# Patient Record
Sex: Female | Born: 1950 | Race: White | Hispanic: No | Marital: Married | Smoking: Former smoker
Health system: Southern US, Community
[De-identification: ages and names within clinical notes are randomized; demographics above are authoritative.]

## PROBLEM LIST (undated history)

## (undated) DIAGNOSIS — R945 Abnormal results of liver function studies: Secondary | ICD-10-CM

## (undated) DIAGNOSIS — Z9189 Other specified personal risk factors, not elsewhere classified: Secondary | ICD-10-CM

## (undated) DIAGNOSIS — K559 Vascular disorder of intestine, unspecified: Secondary | ICD-10-CM

## (undated) DIAGNOSIS — I82622 Acute embolism and thrombosis of deep veins of left upper extremity: Secondary | ICD-10-CM

## (undated) DIAGNOSIS — I6529 Occlusion and stenosis of unspecified carotid artery: Secondary | ICD-10-CM

## (undated) DIAGNOSIS — R7881 Bacteremia: Secondary | ICD-10-CM

## (undated) DIAGNOSIS — M84453A Pathological fracture, unspecified femur, initial encounter for fracture: Secondary | ICD-10-CM

## (undated) DIAGNOSIS — E7140 Disorder of carnitine metabolism, unspecified: Secondary | ICD-10-CM

## (undated) DIAGNOSIS — H269 Unspecified cataract: Secondary | ICD-10-CM

## (undated) DIAGNOSIS — D689 Coagulation defect, unspecified: Secondary | ICD-10-CM

## (undated) DIAGNOSIS — K912 Postsurgical malabsorption, not elsewhere classified: Secondary | ICD-10-CM

## (undated) DIAGNOSIS — Z860100 Personal history of colon polyps, unspecified: Secondary | ICD-10-CM

## (undated) DIAGNOSIS — I739 Peripheral vascular disease, unspecified: Secondary | ICD-10-CM

## (undated) DIAGNOSIS — K90829 Short bowel syndrome, unspecified: Secondary | ICD-10-CM

## (undated) DIAGNOSIS — R161 Splenomegaly, not elsewhere classified: Secondary | ICD-10-CM

## (undated) DIAGNOSIS — A488 Other specified bacterial diseases: Secondary | ICD-10-CM

## (undated) DIAGNOSIS — K219 Gastro-esophageal reflux disease without esophagitis: Secondary | ICD-10-CM

## (undated) DIAGNOSIS — D638 Anemia in other chronic diseases classified elsewhere: Secondary | ICD-10-CM

## (undated) DIAGNOSIS — S42002A Fracture of unspecified part of left clavicle, initial encounter for closed fracture: Secondary | ICD-10-CM

## (undated) DIAGNOSIS — I509 Heart failure, unspecified: Secondary | ICD-10-CM

## (undated) DIAGNOSIS — N289 Disorder of kidney and ureter, unspecified: Secondary | ICD-10-CM

## (undated) DIAGNOSIS — B379 Candidiasis, unspecified: Secondary | ICD-10-CM

## (undated) DIAGNOSIS — Z9289 Personal history of other medical treatment: Secondary | ICD-10-CM

## (undated) DIAGNOSIS — I82409 Acute embolism and thrombosis of unspecified deep veins of unspecified lower extremity: Secondary | ICD-10-CM

## (undated) DIAGNOSIS — T7840XA Allergy, unspecified, initial encounter: Secondary | ICD-10-CM

## (undated) DIAGNOSIS — S7222XA Displaced subtrochanteric fracture of left femur, initial encounter for closed fracture: Secondary | ICD-10-CM

## (undated) DIAGNOSIS — K6389 Other specified diseases of intestine: Secondary | ICD-10-CM

## (undated) DIAGNOSIS — D229 Melanocytic nevi, unspecified: Secondary | ICD-10-CM

## (undated) DIAGNOSIS — J309 Allergic rhinitis, unspecified: Secondary | ICD-10-CM

## (undated) DIAGNOSIS — M1990 Unspecified osteoarthritis, unspecified site: Secondary | ICD-10-CM

## (undated) DIAGNOSIS — D6859 Other primary thrombophilia: Secondary | ICD-10-CM

## (undated) HISTORY — DX: Pathological fracture, unspecified femur, initial encounter for fracture: M84.453A

## (undated) HISTORY — DX: Disorder of kidney and ureter, unspecified: N28.9

## (undated) HISTORY — DX: Occlusion and stenosis of unspecified carotid artery: I65.29

## (undated) HISTORY — DX: Gastro-esophageal reflux disease without esophagitis: K21.9

## (undated) HISTORY — DX: Abnormal results of liver function studies: R94.5

## (undated) HISTORY — DX: Splenomegaly, not elsewhere classified: R16.1

## (undated) HISTORY — DX: Personal history of other medical treatment: Z92.89

## (undated) HISTORY — DX: Acute embolism and thrombosis of unspecified deep veins of unspecified lower extremity: I82.409

## (undated) HISTORY — PX: APPENDECTOMY: SHX54

## (undated) HISTORY — DX: Postsurgical malabsorption, not elsewhere classified: K91.2

## (undated) HISTORY — PX: CHOLECYSTECTOMY: SHX55

## (undated) HISTORY — DX: Bacteremia: R78.81

## (undated) HISTORY — DX: Fracture of unspecified part of left clavicle, initial encounter for closed fracture: S42.002A

## (undated) HISTORY — DX: Short bowel syndrome, unspecified: K90.829

## (undated) HISTORY — DX: Other primary thrombophilia: D68.59

## (undated) HISTORY — DX: Personal history of colon polyps, unspecified: Z86.0100

## (undated) HISTORY — DX: Disorder of carnitine metabolism, unspecified: E71.40

## (undated) HISTORY — DX: Other specified personal risk factors, not elsewhere classified: Z91.89

## (undated) HISTORY — DX: Other specified diseases of intestine: K63.89

## (undated) HISTORY — DX: Personal history of colonic polyps: Z86.010

## (undated) HISTORY — DX: Melanocytic nevi, unspecified: D22.9

## (undated) HISTORY — DX: Allergic rhinitis, unspecified: J30.9

## (undated) HISTORY — DX: Allergy, unspecified, initial encounter: T78.40XA

## (undated) HISTORY — DX: Anemia in other chronic diseases classified elsewhere: D63.8

## (undated) HISTORY — DX: Vascular disorder of intestine, unspecified: K55.9

## (undated) HISTORY — DX: Acute embolism and thrombosis of deep veins of left upper extremity: I82.622

## (undated) HISTORY — DX: Other specified bacterial diseases: A48.8

## (undated) HISTORY — DX: Candidiasis, unspecified: B37.9

## (undated) HISTORY — DX: Unspecified cataract: H26.9

## (undated) HISTORY — DX: Coagulation defect, unspecified: D68.9

## (undated) HISTORY — DX: Heart failure, unspecified: I50.9

## (undated) MED FILL — Acetaminophen Tab 325 MG: ORAL | Qty: 2 | Status: AC

## (undated) MED FILL — Diphenhydramine HCl Cap 25 MG: ORAL | Qty: 1 | Status: AC

---
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
## 2001-01-07 ENCOUNTER — Ambulatory Visit (HOSPITAL_COMMUNITY): Admission: RE | Admit: 2001-01-07 | Discharge: 2001-01-08 | Payer: Self-pay | Admitting: General Surgery

## 2001-01-07 ENCOUNTER — Encounter (HOSPITAL_BASED_OUTPATIENT_CLINIC_OR_DEPARTMENT_OTHER): Payer: Self-pay | Admitting: General Surgery

## 2001-01-07 ENCOUNTER — Encounter (INDEPENDENT_AMBULATORY_CARE_PROVIDER_SITE_OTHER): Payer: Self-pay | Admitting: *Deleted

## 2003-05-13 HISTORY — PX: SMALL INTESTINE SURGERY: SHX150

## 2003-11-05 ENCOUNTER — Emergency Department (HOSPITAL_COMMUNITY): Admission: EM | Admit: 2003-11-05 | Discharge: 2003-11-05 | Payer: Self-pay | Admitting: Emergency Medicine

## 2003-11-20 DIAGNOSIS — K912 Postsurgical malabsorption, not elsewhere classified: Secondary | ICD-10-CM | POA: Insufficient documentation

## 2003-11-20 DIAGNOSIS — D6859 Other primary thrombophilia: Secondary | ICD-10-CM | POA: Insufficient documentation

## 2004-03-15 ENCOUNTER — Ambulatory Visit: Payer: Self-pay | Admitting: Internal Medicine

## 2004-03-21 ENCOUNTER — Ambulatory Visit: Payer: Self-pay | Admitting: Internal Medicine

## 2004-03-21 ENCOUNTER — Encounter (INDEPENDENT_AMBULATORY_CARE_PROVIDER_SITE_OTHER): Payer: Self-pay | Admitting: Specialist

## 2004-03-21 ENCOUNTER — Ambulatory Visit (HOSPITAL_COMMUNITY): Admission: RE | Admit: 2004-03-21 | Discharge: 2004-03-21 | Payer: Self-pay | Admitting: Internal Medicine

## 2004-03-25 ENCOUNTER — Ambulatory Visit (HOSPITAL_COMMUNITY): Admission: RE | Admit: 2004-03-25 | Discharge: 2004-03-25 | Payer: Self-pay | Admitting: Internal Medicine

## 2004-03-26 ENCOUNTER — Ambulatory Visit: Payer: Self-pay | Admitting: Internal Medicine

## 2004-04-02 ENCOUNTER — Ambulatory Visit: Payer: Self-pay | Admitting: Internal Medicine

## 2004-04-04 ENCOUNTER — Emergency Department (HOSPITAL_COMMUNITY): Admission: EM | Admit: 2004-04-04 | Discharge: 2004-04-04 | Payer: Self-pay | Admitting: Emergency Medicine

## 2004-04-05 ENCOUNTER — Inpatient Hospital Stay (HOSPITAL_COMMUNITY): Admission: EM | Admit: 2004-04-05 | Discharge: 2004-04-30 | Payer: Self-pay | Admitting: Emergency Medicine

## 2004-04-05 ENCOUNTER — Encounter (INDEPENDENT_AMBULATORY_CARE_PROVIDER_SITE_OTHER): Payer: Self-pay | Admitting: *Deleted

## 2004-04-05 ENCOUNTER — Ambulatory Visit: Payer: Self-pay | Admitting: Gastroenterology

## 2004-04-08 ENCOUNTER — Encounter (INDEPENDENT_AMBULATORY_CARE_PROVIDER_SITE_OTHER): Payer: Self-pay | Admitting: *Deleted

## 2004-04-08 ENCOUNTER — Ambulatory Visit: Payer: Self-pay | Admitting: Oncology

## 2004-04-10 ENCOUNTER — Encounter (INDEPENDENT_AMBULATORY_CARE_PROVIDER_SITE_OTHER): Payer: Self-pay | Admitting: Specialist

## 2004-04-12 ENCOUNTER — Encounter (INDEPENDENT_AMBULATORY_CARE_PROVIDER_SITE_OTHER): Payer: Self-pay | Admitting: *Deleted

## 2004-04-29 ENCOUNTER — Ambulatory Visit: Payer: Self-pay | Admitting: Oncology

## 2004-05-07 ENCOUNTER — Emergency Department (HOSPITAL_COMMUNITY): Admission: EM | Admit: 2004-05-07 | Discharge: 2004-05-07 | Payer: Self-pay | Admitting: Emergency Medicine

## 2004-05-22 ENCOUNTER — Ambulatory Visit: Payer: Self-pay | Admitting: Internal Medicine

## 2004-06-03 ENCOUNTER — Ambulatory Visit: Payer: Self-pay | Admitting: Internal Medicine

## 2004-06-14 ENCOUNTER — Ambulatory Visit: Payer: Self-pay | Admitting: Oncology

## 2004-07-03 ENCOUNTER — Ambulatory Visit: Payer: Self-pay | Admitting: Internal Medicine

## 2004-07-04 ENCOUNTER — Ambulatory Visit: Payer: Self-pay | Admitting: Internal Medicine

## 2004-07-04 ENCOUNTER — Inpatient Hospital Stay (HOSPITAL_COMMUNITY): Admission: EM | Admit: 2004-07-04 | Discharge: 2004-07-11 | Payer: Self-pay | Admitting: *Deleted

## 2004-07-11 ENCOUNTER — Ambulatory Visit: Payer: Self-pay | Admitting: Gastroenterology

## 2004-07-19 ENCOUNTER — Ambulatory Visit: Payer: Self-pay | Admitting: Internal Medicine

## 2004-07-31 ENCOUNTER — Ambulatory Visit: Payer: Self-pay | Admitting: Oncology

## 2004-08-19 ENCOUNTER — Ambulatory Visit: Payer: Self-pay | Admitting: Internal Medicine

## 2004-09-12 ENCOUNTER — Ambulatory Visit: Payer: Self-pay | Admitting: Oncology

## 2004-09-16 ENCOUNTER — Ambulatory Visit: Payer: Self-pay | Admitting: Internal Medicine

## 2004-10-22 ENCOUNTER — Ambulatory Visit (HOSPITAL_COMMUNITY): Admission: RE | Admit: 2004-10-22 | Discharge: 2004-10-22 | Payer: Self-pay | Admitting: Internal Medicine

## 2004-10-28 ENCOUNTER — Ambulatory Visit: Payer: Self-pay | Admitting: Oncology

## 2004-11-05 ENCOUNTER — Encounter: Payer: Self-pay | Admitting: Internal Medicine

## 2004-11-06 ENCOUNTER — Ambulatory Visit (HOSPITAL_COMMUNITY): Admission: RE | Admit: 2004-11-06 | Discharge: 2004-11-06 | Payer: Self-pay | Admitting: Oncology

## 2004-11-19 DIAGNOSIS — K6389 Other specified diseases of intestine: Secondary | ICD-10-CM | POA: Insufficient documentation

## 2004-12-03 ENCOUNTER — Encounter: Payer: Self-pay | Admitting: Internal Medicine

## 2004-12-04 ENCOUNTER — Ambulatory Visit: Payer: Self-pay | Admitting: Internal Medicine

## 2004-12-18 ENCOUNTER — Ambulatory Visit: Payer: Self-pay | Admitting: Oncology

## 2005-02-14 ENCOUNTER — Ambulatory Visit: Payer: Self-pay | Admitting: Oncology

## 2005-03-12 ENCOUNTER — Ambulatory Visit: Payer: Self-pay | Admitting: Internal Medicine

## 2005-04-08 ENCOUNTER — Ambulatory Visit: Payer: Self-pay | Admitting: Oncology

## 2005-05-01 ENCOUNTER — Ambulatory Visit: Payer: Self-pay | Admitting: Internal Medicine

## 2005-06-03 ENCOUNTER — Ambulatory Visit: Payer: Self-pay | Admitting: Oncology

## 2005-07-22 ENCOUNTER — Ambulatory Visit: Payer: Self-pay | Admitting: Oncology

## 2005-08-07 ENCOUNTER — Ambulatory Visit: Payer: Self-pay | Admitting: Internal Medicine

## 2005-09-02 ENCOUNTER — Ambulatory Visit: Payer: Self-pay | Admitting: Oncology

## 2005-09-24 LAB — CBC WITH DIFFERENTIAL (CANCER CENTER ONLY)
BASO%: 1 % (ref 0.0–2.0)
Eosinophils Absolute: 0.3 10*3/uL (ref 0.0–0.5)
LYMPH%: 36.5 % (ref 14.0–48.0)
MCV: 96 fL (ref 81–101)
MONO#: 0.9 10*3/uL (ref 0.1–0.9)
NEUT#: 3.4 10*3/uL (ref 1.5–6.5)
Platelets: 129 10*3/uL — ABNORMAL LOW (ref 145–400)
RBC: 3.62 10*6/uL — ABNORMAL LOW (ref 3.70–5.32)
RDW: 12.7 % (ref 10.5–14.6)
WBC: 7.4 10*3/uL (ref 3.9–10.0)

## 2005-09-24 LAB — PROTIME-INR (CHCC SATELLITE)
INR: 2 (ref 2.0–3.5)
Protime: 17.5 Seconds — ABNORMAL HIGH (ref 10.6–13.4)

## 2005-10-08 LAB — CBC WITH DIFFERENTIAL (CANCER CENTER ONLY)
BASO%: 0.7 % (ref 0.0–2.0)
EOS%: 2.9 % (ref 0.0–7.0)
Eosinophils Absolute: 0.2 10*3/uL (ref 0.0–0.5)
LYMPH%: 43.6 % (ref 14.0–48.0)
MCH: 31.2 pg (ref 26.0–34.0)
MCHC: 32.5 g/dL (ref 32.0–36.0)
MCV: 96 fL (ref 81–101)
MONO%: 9.4 % (ref 0.0–13.0)
NEUT#: 2.2 10*3/uL (ref 1.5–6.5)
Platelets: 150 10*3/uL (ref 145–400)
RBC: 3.77 10*6/uL (ref 3.70–5.32)
RDW: 12 % (ref 10.5–14.6)

## 2005-10-08 LAB — PROTIME-INR (CHCC SATELLITE)
INR: 1.8 — ABNORMAL LOW (ref 2.0–3.5)
Protime: 16.5 s — ABNORMAL HIGH (ref 10.6–13.4)

## 2005-10-08 LAB — BASIC METABOLIC PANEL WITH GFR
BUN: 18 mg/dL (ref 6–23)
CO2: 28 meq/L (ref 19–32)
Calcium: 8.5 mg/dL (ref 8.4–10.5)
Chloride: 102 meq/L (ref 96–112)
Creatinine, Ser: 0.5 mg/dL (ref 0.4–1.2)
Glucose, Bld: 101 mg/dL — ABNORMAL HIGH (ref 70–99)
Potassium: 4.4 meq/L (ref 3.5–5.3)
Sodium: 139 meq/L (ref 135–145)

## 2005-10-15 LAB — CBC WITH DIFFERENTIAL (CANCER CENTER ONLY)
BASO#: 0 10*3/uL (ref 0.0–0.2)
EOS%: 3.9 % (ref 0.0–7.0)
HCT: 35.2 % (ref 34.8–46.6)
HGB: 11.6 g/dL (ref 11.6–15.9)
LYMPH%: 37.2 % (ref 14.0–48.0)
MCH: 32 pg (ref 26.0–34.0)
MCHC: 33 g/dL (ref 32.0–36.0)
MCV: 97 fL (ref 81–101)
MONO%: 8.8 % (ref 0.0–13.0)
NEUT#: 2.9 10*3/uL (ref 1.5–6.5)
NEUT%: 49.5 % (ref 39.6–80.0)

## 2005-10-15 LAB — PROTIME-INR (CHCC SATELLITE)
INR: 1.6 — ABNORMAL LOW (ref 2.0–3.5)
Protime: 15.4 Seconds — ABNORMAL HIGH (ref 10.6–13.4)

## 2005-10-22 ENCOUNTER — Ambulatory Visit: Payer: Self-pay | Admitting: Oncology

## 2005-10-23 LAB — PROTIME-INR (CHCC SATELLITE)
INR: 1.9 — ABNORMAL LOW (ref 2.0–3.5)
Protime: 16.8 Seconds — ABNORMAL HIGH (ref 10.6–13.4)

## 2005-10-30 LAB — PROTIME-INR (CHCC SATELLITE)

## 2005-11-06 LAB — CBC WITH DIFFERENTIAL (CANCER CENTER ONLY)
BASO%: 0.8 % (ref 0.0–2.0)
Eosinophils Absolute: 0.3 10*3/uL (ref 0.0–0.5)
HCT: 35.2 % (ref 34.8–46.6)
LYMPH#: 2.4 10*3/uL (ref 0.9–3.3)
MCV: 97 fL (ref 81–101)
MONO#: 0.6 10*3/uL (ref 0.1–0.9)
NEUT%: 55.7 % (ref 39.6–80.0)
Platelets: 105 10*3/uL — ABNORMAL LOW (ref 145–400)
RBC: 3.62 10*6/uL — ABNORMAL LOW (ref 3.70–5.32)
RDW: 11.9 % (ref 10.5–14.6)
WBC: 7.5 10*3/uL (ref 3.9–10.0)

## 2005-11-06 LAB — PROTIME-INR (CHCC SATELLITE)

## 2005-11-13 LAB — PROTIME-INR (CHCC SATELLITE): INR: 2.5 (ref 2.0–3.5)

## 2005-11-20 LAB — PROTIME-INR (CHCC SATELLITE)
INR: 2.5 (ref 2.0–3.5)
Protime: 19 Seconds — ABNORMAL HIGH (ref 10.6–13.4)

## 2005-11-26 LAB — CBC WITH DIFFERENTIAL (CANCER CENTER ONLY)
BASO#: 0 10*3/uL (ref 0.0–0.2)
BASO%: 0.6 % (ref 0.0–2.0)
HCT: 38.6 % (ref 34.8–46.6)
HGB: 12.3 g/dL (ref 11.6–15.9)
LYMPH#: 2.4 10*3/uL (ref 0.9–3.3)
MONO#: 0.5 10*3/uL (ref 0.1–0.9)
NEUT%: 48.8 % (ref 39.6–80.0)
RDW: 11.4 % (ref 10.5–14.6)
WBC: 6.3 10*3/uL (ref 3.9–10.0)

## 2005-11-26 LAB — PROTIME-INR (CHCC SATELLITE): Protime: 20 Seconds — ABNORMAL HIGH (ref 10.6–13.4)

## 2005-11-28 ENCOUNTER — Ambulatory Visit: Payer: Self-pay | Admitting: Internal Medicine

## 2005-12-05 ENCOUNTER — Ambulatory Visit (HOSPITAL_COMMUNITY): Admission: RE | Admit: 2005-12-05 | Discharge: 2005-12-05 | Payer: Self-pay | Admitting: Internal Medicine

## 2005-12-05 ENCOUNTER — Ambulatory Visit: Payer: Self-pay | Admitting: Oncology

## 2005-12-05 ENCOUNTER — Encounter: Payer: Self-pay | Admitting: Internal Medicine

## 2005-12-05 HISTORY — PX: COLONOSCOPY: SHX174

## 2005-12-08 LAB — CBC WITH DIFFERENTIAL (CANCER CENTER ONLY)
BASO#: 0.1 10*3/uL (ref 0.0–0.2)
EOS%: 6.2 % (ref 0.0–7.0)
Eosinophils Absolute: 0.4 10*3/uL (ref 0.0–0.5)
HCT: 37.5 % (ref 34.8–46.6)
HGB: 12.3 g/dL (ref 11.6–15.9)
LYMPH#: 1.9 10*3/uL (ref 0.9–3.3)
MCHC: 32.8 g/dL (ref 32.0–36.0)
NEUT%: 50.5 % (ref 39.6–80.0)

## 2005-12-08 LAB — PROTIME-INR (CHCC SATELLITE): Protime: 13.1 Seconds (ref 10.6–13.4)

## 2005-12-09 ENCOUNTER — Ambulatory Visit: Payer: Self-pay | Admitting: Internal Medicine

## 2005-12-11 LAB — PROTIME-INR (CHCC SATELLITE): INR: 1.6 — ABNORMAL LOW (ref 2.0–3.5)

## 2005-12-15 LAB — PROTIME-INR (CHCC SATELLITE): INR: 2.5 (ref 2.0–3.5)

## 2005-12-17 ENCOUNTER — Ambulatory Visit: Payer: Self-pay | Admitting: Internal Medicine

## 2005-12-18 LAB — CBC WITH DIFFERENTIAL (CANCER CENTER ONLY)
BASO#: 0 10*3/uL (ref 0.0–0.2)
Eosinophils Absolute: 0.3 10*3/uL (ref 0.0–0.5)
HCT: 36.4 % (ref 34.8–46.6)
HGB: 12.1 g/dL (ref 11.6–15.9)
LYMPH%: 33.1 % (ref 14.0–48.0)
MCH: 31.5 pg (ref 26.0–34.0)
MCHC: 33.2 g/dL (ref 32.0–36.0)
MCV: 95 fL (ref 81–101)
MONO%: 8.8 % (ref 0.0–13.0)
NEUT#: 3.2 10*3/uL (ref 1.5–6.5)
NEUT%: 53.1 % (ref 39.6–80.0)
RBC: 3.84 10*6/uL (ref 3.70–5.32)

## 2005-12-18 LAB — PROTIME-INR (CHCC SATELLITE)
INR: 3.3 (ref 2.0–3.5)
Protime: 21.8 Seconds — ABNORMAL HIGH (ref 10.6–13.4)

## 2005-12-19 LAB — COMPREHENSIVE METABOLIC PANEL
AST: 33 U/L (ref 0–37)
Albumin: 2.8 g/dL — ABNORMAL LOW (ref 3.5–5.2)
Alkaline Phosphatase: 111 U/L (ref 39–117)
BUN: 14 mg/dL (ref 6–23)
Glucose, Bld: 96 mg/dL (ref 70–99)
Potassium: 3.9 mEq/L (ref 3.5–5.3)
Total Bilirubin: 0.4 mg/dL (ref 0.3–1.2)

## 2005-12-19 LAB — IRON AND TIBC
%SAT: 15 % — ABNORMAL LOW (ref 20–55)
Iron: 49 ug/dL (ref 42–145)
TIBC: 320 ug/dL (ref 250–470)
UIBC: 271 ug/dL

## 2005-12-22 LAB — PROTIME-INR (CHCC SATELLITE)
INR: 1.8 — ABNORMAL LOW (ref 2.0–3.5)
Protime: 16.5 Seconds — ABNORMAL HIGH (ref 10.6–13.4)

## 2005-12-29 LAB — PROTIME-INR (CHCC SATELLITE): INR: 2 (ref 2.0–3.5)

## 2006-01-05 LAB — PROTIME-INR (CHCC SATELLITE): INR: 2 (ref 2.0–3.5)

## 2006-01-15 LAB — CBC WITH DIFFERENTIAL (CANCER CENTER ONLY)
EOS%: 4.9 % (ref 0.0–7.0)
Eosinophils Absolute: 0.3 10*3/uL (ref 0.0–0.5)
LYMPH%: 37.9 % (ref 14.0–48.0)
MCH: 31.1 pg (ref 26.0–34.0)
MCHC: 32.7 g/dL (ref 32.0–36.0)
MCV: 95 fL (ref 81–101)
MONO%: 7.5 % (ref 0.0–13.0)
Platelets: 144 10*3/uL — ABNORMAL LOW (ref 145–400)
RBC: 3.75 10*6/uL (ref 3.70–5.32)

## 2006-01-15 LAB — PROTIME-INR (CHCC SATELLITE)
INR: 3.6 — ABNORMAL HIGH (ref 2.0–3.5)
Protime: 43.2 Seconds — ABNORMAL HIGH (ref 10.6–13.4)

## 2006-01-20 ENCOUNTER — Ambulatory Visit: Payer: Self-pay | Admitting: Oncology

## 2006-01-22 LAB — CBC WITH DIFFERENTIAL (CANCER CENTER ONLY)
BASO%: 0.7 % (ref 0.0–2.0)
LYMPH%: 33 % (ref 14.0–48.0)
MCH: 31.8 pg (ref 26.0–34.0)
MCV: 96 fL (ref 81–101)
MONO#: 0.4 10*3/uL (ref 0.1–0.9)
MONO%: 5.8 % (ref 0.0–13.0)
Platelets: 153 10*3/uL (ref 145–400)
RDW: 11.3 % (ref 10.5–14.6)
WBC: 7 10*3/uL (ref 3.9–10.0)

## 2006-01-22 LAB — PROTIME-INR (CHCC SATELLITE): Protime: 24 Seconds — ABNORMAL HIGH (ref 10.6–13.4)

## 2006-01-29 LAB — PROTIME-INR (CHCC SATELLITE): Protime: 21.6 Seconds — ABNORMAL HIGH (ref 10.6–13.4)

## 2006-02-05 LAB — CBC WITH DIFFERENTIAL (CANCER CENTER ONLY)
BASO%: 0.7 % (ref 0.0–2.0)
LYMPH#: 2.5 10*3/uL (ref 0.9–3.3)
MONO#: 0.4 10*3/uL (ref 0.1–0.9)
NEUT#: 3.7 10*3/uL (ref 1.5–6.5)
Platelets: 182 10*3/uL (ref 145–400)
RDW: 11.7 % (ref 10.5–14.6)
WBC: 7.1 10*3/uL (ref 3.9–10.0)

## 2006-02-05 LAB — PROTIME-INR (CHCC SATELLITE)
INR: 2.1 (ref 2.0–3.5)
Protime: 25.2 Seconds — ABNORMAL HIGH (ref 10.6–13.4)

## 2006-02-25 ENCOUNTER — Ambulatory Visit: Payer: Self-pay | Admitting: Internal Medicine

## 2006-02-26 LAB — CBC WITH DIFFERENTIAL (CANCER CENTER ONLY)
BASO#: 0 10*3/uL (ref 0.0–0.2)
Eosinophils Absolute: 0.2 10*3/uL (ref 0.0–0.5)
HCT: 35 % (ref 34.8–46.6)
HGB: 11.7 g/dL (ref 11.6–15.9)
LYMPH#: 2.4 10*3/uL (ref 0.9–3.3)
MONO#: 0.7 10*3/uL (ref 0.1–0.9)
NEUT#: 3.1 10*3/uL (ref 1.5–6.5)
NEUT%: 47.6 % (ref 39.6–80.0)
RBC: 3.67 10*6/uL — ABNORMAL LOW (ref 3.70–5.32)

## 2006-03-17 ENCOUNTER — Ambulatory Visit: Payer: Self-pay | Admitting: Oncology

## 2006-03-19 LAB — CBC WITH DIFFERENTIAL (CANCER CENTER ONLY)
BASO%: 0.6 % (ref 0.0–2.0)
EOS%: 5.7 % (ref 0.0–7.0)
HCT: 36.1 % (ref 34.8–46.6)
HGB: 12 g/dL (ref 11.6–15.9)
LYMPH#: 2.3 10*3/uL (ref 0.9–3.3)
MCHC: 33.3 g/dL (ref 32.0–36.0)
MONO#: 0.5 10*3/uL (ref 0.1–0.9)
NEUT#: 3.1 10*3/uL (ref 1.5–6.5)
RDW: 10.7 % (ref 10.5–14.6)
WBC: 6.3 10*3/uL (ref 3.9–10.0)

## 2006-03-19 LAB — PROTIME-INR (CHCC SATELLITE)
INR: 2.5 (ref 2.0–3.5)
Protime: 30 Seconds — ABNORMAL HIGH (ref 10.6–13.4)

## 2006-04-17 LAB — PROTIME-INR (CHCC SATELLITE): Protime: 28.8 Seconds — ABNORMAL HIGH (ref 10.6–13.4)

## 2006-05-14 ENCOUNTER — Ambulatory Visit: Payer: Self-pay | Admitting: Oncology

## 2006-05-15 ENCOUNTER — Other Ambulatory Visit: Payer: Self-pay | Admitting: Oncology

## 2006-05-15 LAB — PROTIME-INR (CHCC SATELLITE)
INR: 1.9 — ABNORMAL LOW (ref 2.0–3.5)
Protime: 22.8 Seconds — ABNORMAL HIGH (ref 10.6–13.4)

## 2006-05-21 LAB — CBC WITH DIFFERENTIAL (CANCER CENTER ONLY)
BASO#: 0 10*3/uL (ref 0.0–0.2)
EOS%: 4 % (ref 0.0–7.0)
Eosinophils Absolute: 0.2 10*3/uL (ref 0.0–0.5)
HGB: 12 g/dL (ref 11.6–15.9)
LYMPH%: 46 % (ref 14.0–48.0)
MCH: 32.7 pg (ref 26.0–34.0)
MCHC: 34.3 g/dL (ref 32.0–36.0)
MCV: 95 fL (ref 81–101)
MONO%: 8.8 % (ref 0.0–13.0)
NEUT#: 2.4 10*3/uL (ref 1.5–6.5)
NEUT%: 40.6 % (ref 39.6–80.0)
RBC: 3.67 10*6/uL — ABNORMAL LOW (ref 3.70–5.32)

## 2006-05-21 LAB — BASIC METABOLIC PANEL
BUN: 13 mg/dL (ref 6–23)
Chloride: 108 mEq/L (ref 96–112)
Creatinine, Ser: 0.49 mg/dL (ref 0.40–1.20)
Potassium: 3.9 mEq/L (ref 3.5–5.3)

## 2006-05-21 LAB — PROTIME-INR (CHCC SATELLITE)
INR: 1.7 — ABNORMAL LOW (ref 2.0–3.5)
Protime: 20.4 Seconds — ABNORMAL HIGH (ref 10.6–13.4)

## 2006-05-28 LAB — PROTIME-INR (CHCC SATELLITE)
INR: 2.2 (ref 2.0–3.5)
Protime: 26.4 Seconds — ABNORMAL HIGH (ref 10.6–13.4)

## 2006-06-04 ENCOUNTER — Ambulatory Visit: Payer: Self-pay | Admitting: Internal Medicine

## 2006-06-18 LAB — CBC WITH DIFFERENTIAL (CANCER CENTER ONLY)
BASO#: 0 10*3/uL (ref 0.0–0.2)
EOS%: 4.1 % (ref 0.0–7.0)
Eosinophils Absolute: 0.2 10*3/uL (ref 0.0–0.5)
HCT: 37.7 % (ref 34.8–46.6)
HGB: 12.6 g/dL (ref 11.6–15.9)
LYMPH#: 3 10*3/uL (ref 0.9–3.3)
MCH: 32.4 pg (ref 26.0–34.0)
MCHC: 33.5 g/dL (ref 32.0–36.0)
NEUT%: 35.4 % — ABNORMAL LOW (ref 39.6–80.0)
RBC: 3.9 10*6/uL (ref 3.70–5.32)

## 2006-06-18 LAB — PROTIME-INR (CHCC SATELLITE)
INR: 1.5 — ABNORMAL LOW (ref 2.0–3.5)
Protime: 18 s — ABNORMAL HIGH (ref 10.6–13.4)

## 2006-06-25 LAB — PROTIME-INR (CHCC SATELLITE)

## 2006-07-01 ENCOUNTER — Ambulatory Visit: Payer: Self-pay | Admitting: Oncology

## 2006-07-02 LAB — PROTIME-INR (CHCC SATELLITE): Protime: 26.4 Seconds — ABNORMAL HIGH (ref 10.6–13.4)

## 2006-07-13 LAB — CBC WITH DIFFERENTIAL (CANCER CENTER ONLY)
BASO#: 0.1 10*3/uL (ref 0.0–0.2)
Eosinophils Absolute: 0.4 10*3/uL (ref 0.0–0.5)
HCT: 38.1 % (ref 34.8–46.6)
HGB: 12.8 g/dL (ref 11.6–15.9)
LYMPH#: 2.3 10*3/uL (ref 0.9–3.3)
LYMPH%: 30.6 % (ref 14.0–48.0)
MCV: 96 fL (ref 81–101)
MONO#: 0.7 10*3/uL (ref 0.1–0.9)
NEUT%: 54.5 % (ref 39.6–80.0)
WBC: 7.4 10*3/uL (ref 3.9–10.0)

## 2006-07-13 LAB — PROTIME-INR (CHCC SATELLITE)
INR: 2 (ref 2.0–3.5)
Protime: 24 Seconds — ABNORMAL HIGH (ref 10.6–13.4)

## 2006-08-13 LAB — CBC WITH DIFFERENTIAL (CANCER CENTER ONLY)
BASO#: 0.1 10*3/uL (ref 0.0–0.2)
EOS%: 3.9 % (ref 0.0–7.0)
HCT: 38 % (ref 34.8–46.6)
HGB: 12.7 g/dL (ref 11.6–15.9)
LYMPH#: 3.3 10*3/uL (ref 0.9–3.3)
LYMPH%: 40 % (ref 14.0–48.0)
MCH: 31.8 pg (ref 26.0–34.0)
MCHC: 33.4 g/dL (ref 32.0–36.0)
MCV: 95 fL (ref 81–101)
MONO%: 7 % (ref 0.0–13.0)
NEUT%: 48.3 % (ref 39.6–80.0)

## 2006-08-19 ENCOUNTER — Ambulatory Visit: Payer: Self-pay | Admitting: Oncology

## 2006-08-24 LAB — PROTIME-INR (CHCC SATELLITE): INR: 1.5 — ABNORMAL LOW (ref 2.0–3.5)

## 2006-08-31 LAB — PROTIME-INR (CHCC SATELLITE)
INR: 1.7 — ABNORMAL LOW (ref 2.0–3.5)
Protime: 20.4 Seconds — ABNORMAL HIGH (ref 10.6–13.4)

## 2006-09-07 LAB — PROTIME-INR (CHCC SATELLITE): INR: 2.6 (ref 2.0–3.5)

## 2006-09-17 LAB — COMPREHENSIVE METABOLIC PANEL
ALT: 28 U/L (ref 0–35)
Albumin: 3.8 g/dL (ref 3.5–5.2)
CO2: 21 mEq/L (ref 19–32)
Chloride: 105 mEq/L (ref 96–112)
Glucose, Bld: 88 mg/dL (ref 70–99)
Sodium: 140 mEq/L (ref 135–145)

## 2006-09-17 LAB — CBC WITH DIFFERENTIAL (CANCER CENTER ONLY)
BASO#: 0 10*3/uL (ref 0.0–0.2)
EOS%: 3.6 % (ref 0.0–7.0)
HGB: 11.7 g/dL (ref 11.6–15.9)
MCH: 32.3 pg (ref 26.0–34.0)
MCHC: 34.2 g/dL (ref 32.0–36.0)
MONO%: 8.3 % (ref 0.0–13.0)
NEUT#: 2.3 10*3/uL (ref 1.5–6.5)
RDW: 12.1 % (ref 10.5–14.6)

## 2006-09-17 LAB — PROTIME-INR (CHCC SATELLITE)

## 2006-09-21 ENCOUNTER — Encounter: Payer: Self-pay | Admitting: Internal Medicine

## 2006-09-24 LAB — PROTIME-INR (CHCC SATELLITE): INR: 1.6 — ABNORMAL LOW (ref 2.0–3.5)

## 2006-10-01 LAB — PROTIME-INR (CHCC SATELLITE)
INR: 1.9 — ABNORMAL LOW (ref 2.0–3.5)
Protime: 22.8 Seconds — ABNORMAL HIGH (ref 10.6–13.4)

## 2006-10-02 ENCOUNTER — Ambulatory Visit (HOSPITAL_COMMUNITY): Admission: RE | Admit: 2006-10-02 | Discharge: 2006-10-02 | Payer: Self-pay | Admitting: Internal Medicine

## 2006-10-07 ENCOUNTER — Ambulatory Visit: Payer: Self-pay | Admitting: Oncology

## 2006-10-08 LAB — PROTIME-INR (CHCC SATELLITE): Protime: 19.2 Seconds — ABNORMAL HIGH (ref 10.6–13.4)

## 2006-10-16 LAB — CBC WITH DIFFERENTIAL (CANCER CENTER ONLY)
BASO#: 0 10*3/uL (ref 0.0–0.2)
Eosinophils Absolute: 0.5 10*3/uL (ref 0.0–0.5)
HGB: 12.1 g/dL (ref 11.6–15.9)
LYMPH#: 2.8 10*3/uL (ref 0.9–3.3)
NEUT#: 2 10*3/uL (ref 1.5–6.5)
RBC: 3.87 10*6/uL (ref 3.70–5.32)
WBC: 5.8 10*3/uL (ref 3.9–10.0)

## 2006-10-23 LAB — PROTIME-INR (CHCC SATELLITE)
INR: 1.8 — ABNORMAL LOW (ref 2.0–3.5)
Protime: 21.6 Seconds — ABNORMAL HIGH (ref 10.6–13.4)

## 2006-10-28 ENCOUNTER — Ambulatory Visit: Payer: Self-pay | Admitting: Internal Medicine

## 2006-10-30 LAB — PROTIME-INR (CHCC SATELLITE): INR: 2 (ref 2.0–3.5)

## 2006-11-25 ENCOUNTER — Ambulatory Visit: Payer: Self-pay | Admitting: Oncology

## 2006-11-26 LAB — PROTIME-INR (CHCC SATELLITE)

## 2006-12-03 LAB — PROTIME-INR (CHCC SATELLITE)
INR: 1.7 — ABNORMAL LOW (ref 2.0–3.5)
Protime: 20.4 Seconds — ABNORMAL HIGH (ref 10.6–13.4)

## 2006-12-17 LAB — PROTIME-INR (CHCC SATELLITE)
INR: 1.9 — ABNORMAL LOW (ref 2.0–3.5)
Protime: 22.8 Seconds — ABNORMAL HIGH (ref 10.6–13.4)

## 2007-01-07 LAB — CBC WITH DIFFERENTIAL (CANCER CENTER ONLY)
EOS%: 4.9 % (ref 0.0–7.0)
Eosinophils Absolute: 0.3 10*3/uL (ref 0.0–0.5)
MCH: 30.9 pg (ref 26.0–34.0)
MCHC: 33.6 g/dL (ref 32.0–36.0)
MONO%: 8.9 % (ref 0.0–13.0)
NEUT#: 2.9 10*3/uL (ref 1.5–6.5)
Platelets: 163 10*3/uL (ref 145–400)
RBC: 3.92 10*6/uL (ref 3.70–5.32)

## 2007-01-07 LAB — PROTIME-INR (CHCC SATELLITE): INR: 2.6 (ref 2.0–3.5)

## 2007-01-13 ENCOUNTER — Ambulatory Visit: Payer: Self-pay | Admitting: Oncology

## 2007-01-15 LAB — COMPREHENSIVE METABOLIC PANEL
ALT: 65 U/L — ABNORMAL HIGH (ref 0–35)
Alkaline Phosphatase: 102 U/L (ref 39–117)
CO2: 29 mEq/L (ref 19–32)
Creatinine, Ser: 0.5 mg/dL (ref 0.40–1.20)
Glucose, Bld: 94 mg/dL (ref 70–99)
Sodium: 139 mEq/L (ref 135–145)
Total Bilirubin: 0.4 mg/dL (ref 0.3–1.2)
Total Protein: 7.6 g/dL (ref 6.0–8.3)

## 2007-01-15 LAB — IRON AND TIBC
%SAT: 26 % (ref 20–55)
Iron: 73 ug/dL (ref 42–145)
UIBC: 209 ug/dL

## 2007-01-15 LAB — PROTIME-INR (CHCC SATELLITE): Protime: 36 Seconds — ABNORMAL HIGH (ref 10.6–13.4)

## 2007-01-15 LAB — CBC WITH DIFFERENTIAL (CANCER CENTER ONLY)
Eosinophils Absolute: 0.3 10*3/uL (ref 0.0–0.5)
MONO#: 0.6 10*3/uL (ref 0.1–0.9)
NEUT#: 2.3 10*3/uL (ref 1.5–6.5)
Platelets: 134 10*3/uL — ABNORMAL LOW (ref 145–400)
RBC: 3.76 10*6/uL (ref 3.70–5.32)
WBC: 5.6 10*3/uL (ref 3.9–10.0)

## 2007-01-15 LAB — FERRITIN: Ferritin: 190 ng/mL (ref 10–291)

## 2007-01-26 LAB — PROTIME-INR (CHCC SATELLITE): Protime: 32.4 Seconds — ABNORMAL HIGH (ref 10.6–13.4)

## 2007-02-10 LAB — CBC WITH DIFFERENTIAL (CANCER CENTER ONLY)
Eosinophils Absolute: 0.2 10*3/uL (ref 0.0–0.5)
HCT: 32.7 % — ABNORMAL LOW (ref 34.8–46.6)
LYMPH%: 44.8 % (ref 14.0–48.0)
MCV: 96 fL (ref 81–101)
MONO#: 0.5 10*3/uL (ref 0.1–0.9)
NEUT%: 42.7 % (ref 39.6–80.0)
Platelets: 93 10*3/uL — ABNORMAL LOW (ref 145–400)
RBC: 3.4 10*6/uL — ABNORMAL LOW (ref 3.70–5.32)
WBC: 6 10*3/uL (ref 3.9–10.0)

## 2007-02-10 LAB — PROTIME-INR (CHCC SATELLITE)
INR: 2.2 (ref 2.0–3.5)
Protime: 26.4 Seconds — ABNORMAL HIGH (ref 10.6–13.4)

## 2007-02-24 LAB — PROTIME-INR (CHCC SATELLITE): INR: 2.5 (ref 2.0–3.5)

## 2007-03-03 ENCOUNTER — Ambulatory Visit: Payer: Self-pay | Admitting: Oncology

## 2007-03-04 LAB — CBC WITH DIFFERENTIAL (CANCER CENTER ONLY)
Eosinophils Absolute: 0.3 10*3/uL (ref 0.0–0.5)
HCT: 37.2 % (ref 34.8–46.6)
LYMPH%: 49.4 % — ABNORMAL HIGH (ref 14.0–48.0)
MCH: 32.4 pg (ref 26.0–34.0)
MCV: 98 fL (ref 81–101)
MONO%: 8.1 % (ref 0.0–13.0)
NEUT%: 37 % — ABNORMAL LOW (ref 39.6–80.0)
Platelets: 133 10*3/uL — ABNORMAL LOW (ref 145–400)
RBC: 3.79 10*6/uL (ref 3.70–5.32)

## 2007-03-13 HISTORY — PX: ORIF PROXIMAL FEMORAL FRACTURE W/ ITST NAIL SYSTEM: SUR949

## 2007-03-15 ENCOUNTER — Inpatient Hospital Stay (HOSPITAL_COMMUNITY): Admission: EM | Admit: 2007-03-15 | Discharge: 2007-03-21 | Payer: Self-pay | Admitting: *Deleted

## 2007-04-01 LAB — CBC WITH DIFFERENTIAL (CANCER CENTER ONLY)
BASO%: 0.7 % (ref 0.0–2.0)
EOS%: 5.8 % (ref 0.0–7.0)
LYMPH%: 37 % (ref 14.0–48.0)
MCH: 32.4 pg (ref 26.0–34.0)
MCHC: 33.6 g/dL (ref 32.0–36.0)
MCV: 96 fL (ref 81–101)
MONO#: 0.5 10*3/uL (ref 0.1–0.9)
MONO%: 7.5 % (ref 0.0–13.0)
NEUT%: 49 % (ref 39.6–80.0)
Platelets: 246 10*3/uL (ref 145–400)
RDW: 10.6 % (ref 10.5–14.6)
WBC: 7.3 10*3/uL (ref 3.9–10.0)

## 2007-04-01 LAB — PROTIME-INR (CHCC SATELLITE)

## 2007-04-05 LAB — PROTIME-INR (CHCC SATELLITE)
INR: 2.7 (ref 2.0–3.5)
Protime: 32.4 Seconds — ABNORMAL HIGH (ref 10.6–13.4)

## 2007-04-12 LAB — PROTIME-INR (CHCC SATELLITE)
INR: 2.3 (ref 2.0–3.5)
Protime: 27.6 Seconds — ABNORMAL HIGH (ref 10.6–13.4)

## 2007-04-19 ENCOUNTER — Ambulatory Visit: Payer: Self-pay | Admitting: Oncology

## 2007-04-19 LAB — PROTIME-INR (CHCC SATELLITE)

## 2007-04-29 LAB — CBC WITH DIFFERENTIAL (CANCER CENTER ONLY)
BASO%: 0.8 % (ref 0.0–2.0)
EOS%: 4.5 % (ref 0.0–7.0)
LYMPH#: 2.9 10*3/uL (ref 0.9–3.3)
MCHC: 33.2 g/dL (ref 32.0–36.0)
MONO#: 0.5 10*3/uL (ref 0.1–0.9)
Platelets: 120 10*3/uL — ABNORMAL LOW (ref 145–400)
RDW: 10.9 % (ref 10.5–14.6)
WBC: 5.8 10*3/uL (ref 3.9–10.0)

## 2007-04-29 LAB — PROTIME-INR (CHCC SATELLITE): INR: 4.2 — ABNORMAL HIGH (ref 2.0–3.5)

## 2007-04-30 ENCOUNTER — Ambulatory Visit: Payer: Self-pay | Admitting: Internal Medicine

## 2007-05-07 LAB — PROTIME-INR (CHCC SATELLITE): Protime: 27.6 Seconds — ABNORMAL HIGH (ref 10.6–13.4)

## 2007-05-20 LAB — PROTIME-INR (CHCC SATELLITE): Protime: 31.2 Seconds — ABNORMAL HIGH (ref 10.6–13.4)

## 2007-05-27 LAB — CBC WITH DIFFERENTIAL (CANCER CENTER ONLY)
BASO#: 0 10*3/uL (ref 0.0–0.2)
Eosinophils Absolute: 0.3 10*3/uL (ref 0.0–0.5)
HGB: 12 g/dL (ref 11.6–15.9)
LYMPH%: 43.8 % (ref 14.0–48.0)
MCH: 31.5 pg (ref 26.0–34.0)
MCV: 95 fL (ref 81–101)
MONO%: 9.6 % (ref 0.0–13.0)
NEUT#: 2.4 10*3/uL (ref 1.5–6.5)
RBC: 3.81 10*6/uL (ref 3.70–5.32)

## 2007-05-27 LAB — PROTIME-INR (CHCC SATELLITE): INR: 2.2 (ref 2.0–3.5)

## 2007-06-22 ENCOUNTER — Ambulatory Visit: Payer: Self-pay | Admitting: Oncology

## 2007-06-24 LAB — CBC WITH DIFFERENTIAL (CANCER CENTER ONLY)
BASO%: 0.8 % (ref 0.0–2.0)
EOS%: 3.3 % (ref 0.0–7.0)
HCT: 31.6 % — ABNORMAL LOW (ref 34.8–46.6)
LYMPH#: 1.8 10*3/uL (ref 0.9–3.3)
MCHC: 33.2 g/dL (ref 32.0–36.0)
MONO#: 0.6 10*3/uL (ref 0.1–0.9)
NEUT%: 45.4 % (ref 39.6–80.0)
Platelets: 150 10*3/uL (ref 145–400)
RDW: 12.9 % (ref 10.5–14.6)
WBC: 4.9 10*3/uL (ref 3.9–10.0)

## 2007-06-24 LAB — PROTIME-INR (CHCC SATELLITE): INR: 3.5 (ref 2.0–3.5)

## 2007-07-08 LAB — PROTIME-INR (CHCC SATELLITE)

## 2007-07-22 LAB — CBC WITH DIFFERENTIAL (CANCER CENTER ONLY)
BASO#: 0 10*3/uL (ref 0.0–0.2)
BASO%: 0.5 % (ref 0.0–2.0)
HCT: 33.9 % — ABNORMAL LOW (ref 34.8–46.6)
HGB: 11.4 g/dL — ABNORMAL LOW (ref 11.6–15.9)
LYMPH#: 1.3 10*3/uL (ref 0.9–3.3)
MONO#: 0.5 10*3/uL (ref 0.1–0.9)
NEUT#: 3.1 10*3/uL (ref 1.5–6.5)
NEUT%: 61.7 % (ref 39.6–80.0)
RDW: 12.3 % (ref 10.5–14.6)
WBC: 5.1 10*3/uL (ref 3.9–10.0)

## 2007-07-22 LAB — PROTIME-INR (CHCC SATELLITE)
INR: 2.8 (ref 2.0–3.5)
Protime: 33.6 Seconds — ABNORMAL HIGH (ref 10.6–13.4)

## 2007-08-10 ENCOUNTER — Ambulatory Visit: Payer: Self-pay | Admitting: Oncology

## 2007-08-12 LAB — PROTHROMBIN TIME: INR: 3.5 — ABNORMAL HIGH (ref 0.0–1.5)

## 2007-08-19 LAB — CBC WITH DIFFERENTIAL (CANCER CENTER ONLY)
BASO%: 0.6 % (ref 0.0–2.0)
EOS%: 7 % (ref 0.0–7.0)
HCT: 30.7 % — ABNORMAL LOW (ref 34.8–46.6)
LYMPH%: 29 % (ref 14.0–48.0)
MCH: 31.3 pg (ref 26.0–34.0)
MCHC: 33.7 g/dL (ref 32.0–36.0)
MCV: 93 fL (ref 81–101)
MONO%: 10 % (ref 0.0–13.0)
NEUT%: 53.4 % (ref 39.6–80.0)
Platelets: 149 10*3/uL (ref 145–400)
RDW: 11.4 % (ref 10.5–14.6)
WBC: 5.1 10*3/uL (ref 3.9–10.0)

## 2007-08-19 LAB — PROTHROMBIN TIME: Prothrombin Time: 30.2 seconds — ABNORMAL HIGH (ref 11.6–15.2)

## 2007-09-16 LAB — CBC WITH DIFFERENTIAL (CANCER CENTER ONLY)
BASO#: 0.1 10*3/uL (ref 0.0–0.2)
Eosinophils Absolute: 0.4 10*3/uL (ref 0.0–0.5)
HGB: 11.2 g/dL — ABNORMAL LOW (ref 11.6–15.9)
LYMPH#: 1.9 10*3/uL (ref 0.9–3.3)
MONO#: 0.7 10*3/uL (ref 0.1–0.9)
NEUT#: 3 10*3/uL (ref 1.5–6.5)
RBC: 3.78 10*6/uL (ref 3.70–5.32)

## 2007-09-16 LAB — PROTIME-INR (CHCC SATELLITE): Protime: 49.2 Seconds — ABNORMAL HIGH (ref 10.6–13.4)

## 2007-09-22 ENCOUNTER — Ambulatory Visit: Payer: Self-pay | Admitting: Oncology

## 2007-09-23 ENCOUNTER — Encounter: Payer: Self-pay | Admitting: Internal Medicine

## 2007-09-23 LAB — PROTIME-INR (CHCC SATELLITE)
INR: 2.7 (ref 2.0–3.5)
Protime: 32.4 Seconds — ABNORMAL HIGH (ref 10.6–13.4)

## 2007-09-26 ENCOUNTER — Encounter: Payer: Self-pay | Admitting: Internal Medicine

## 2007-09-27 ENCOUNTER — Encounter: Payer: Self-pay | Admitting: Internal Medicine

## 2007-10-07 ENCOUNTER — Encounter: Payer: Self-pay | Admitting: Internal Medicine

## 2007-10-14 LAB — CBC WITH DIFFERENTIAL (CANCER CENTER ONLY)
BASO#: 0.1 10*3/uL (ref 0.0–0.2)
Eosinophils Absolute: 0.2 10*3/uL (ref 0.0–0.5)
HCT: 34.2 % — ABNORMAL LOW (ref 34.8–46.6)
HGB: 11.5 g/dL — ABNORMAL LOW (ref 11.6–15.9)
LYMPH%: 34.2 % (ref 14.0–48.0)
MCH: 29.7 pg (ref 26.0–34.0)
MCV: 89 fL (ref 81–101)
MONO%: 10.7 % (ref 0.0–13.0)
NEUT#: 3.1 10*3/uL (ref 1.5–6.5)
RBC: 3.86 10*6/uL (ref 3.70–5.32)

## 2007-10-14 LAB — PROTIME-INR (CHCC SATELLITE): INR: 3 (ref 2.0–3.5)

## 2007-10-22 ENCOUNTER — Encounter: Payer: Self-pay | Admitting: Internal Medicine

## 2007-10-25 ENCOUNTER — Encounter: Payer: Self-pay | Admitting: Internal Medicine

## 2007-11-05 ENCOUNTER — Ambulatory Visit: Payer: Self-pay | Admitting: Oncology

## 2007-11-11 LAB — CBC WITH DIFFERENTIAL (CANCER CENTER ONLY)
BASO%: 0.8 % (ref 0.0–2.0)
EOS%: 5 % (ref 0.0–7.0)
LYMPH%: 32.7 % (ref 14.0–48.0)
MCH: 29.2 pg (ref 26.0–34.0)
MCHC: 33.5 g/dL (ref 32.0–36.0)
MCV: 87 fL (ref 81–101)
MONO%: 9.7 % (ref 0.0–13.0)
Platelets: 153 10*3/uL (ref 145–400)
RDW: 12.9 % (ref 10.5–14.6)
WBC: 5.7 10*3/uL (ref 3.9–10.0)

## 2007-11-11 LAB — PROTIME-INR (CHCC SATELLITE)

## 2007-11-24 ENCOUNTER — Encounter: Payer: Self-pay | Admitting: Internal Medicine

## 2007-11-26 ENCOUNTER — Encounter: Payer: Self-pay | Admitting: Internal Medicine

## 2007-12-01 ENCOUNTER — Telehealth: Payer: Self-pay | Admitting: Internal Medicine

## 2007-12-01 DIAGNOSIS — R509 Fever, unspecified: Secondary | ICD-10-CM | POA: Insufficient documentation

## 2007-12-02 ENCOUNTER — Ambulatory Visit (HOSPITAL_COMMUNITY): Admission: RE | Admit: 2007-12-02 | Discharge: 2007-12-02 | Payer: Self-pay | Admitting: Internal Medicine

## 2007-12-09 LAB — PROTIME-INR (CHCC SATELLITE)
INR: 4 — ABNORMAL HIGH (ref 2.0–3.5)
Protime: 48 Seconds — ABNORMAL HIGH (ref 10.6–13.4)

## 2007-12-09 LAB — CBC WITH DIFFERENTIAL (CANCER CENTER ONLY)
Eosinophils Absolute: 1.3 10*3/uL — ABNORMAL HIGH (ref 0.0–0.5)
LYMPH%: 25.7 % (ref 14.0–48.0)
MCH: 29.3 pg (ref 26.0–34.0)
MCV: 88 fL (ref 81–101)
MONO%: 7.3 % (ref 0.0–13.0)
Platelets: 167 10*3/uL (ref 145–400)
RBC: 3.75 10*6/uL (ref 3.70–5.32)

## 2007-12-16 LAB — PROTIME-INR (CHCC SATELLITE)
INR: 3.6 — ABNORMAL HIGH (ref 2.0–3.5)
Protime: 43.2 Seconds — ABNORMAL HIGH (ref 10.6–13.4)

## 2007-12-20 ENCOUNTER — Ambulatory Visit: Payer: Self-pay | Admitting: Oncology

## 2007-12-21 ENCOUNTER — Encounter: Payer: Self-pay | Admitting: Internal Medicine

## 2007-12-22 ENCOUNTER — Telehealth: Payer: Self-pay | Admitting: Internal Medicine

## 2007-12-27 ENCOUNTER — Encounter: Payer: Self-pay | Admitting: Internal Medicine

## 2007-12-28 ENCOUNTER — Encounter: Payer: Self-pay | Admitting: Internal Medicine

## 2007-12-30 LAB — PROTIME-INR (CHCC SATELLITE): INR: 2.2 (ref 2.0–3.5)

## 2008-01-06 ENCOUNTER — Encounter: Payer: Self-pay | Admitting: Internal Medicine

## 2008-01-06 LAB — CBC WITH DIFFERENTIAL (CANCER CENTER ONLY)
BASO%: 0.6 % (ref 0.0–2.0)
EOS%: 9.9 % — ABNORMAL HIGH (ref 0.0–7.0)
LYMPH#: 2.9 10*3/uL (ref 0.9–3.3)
MONO#: 0.5 10*3/uL (ref 0.1–0.9)
Platelets: 208 10*3/uL (ref 145–400)
RDW: 12 % (ref 10.5–14.6)
WBC: 6.6 10*3/uL (ref 3.9–10.0)

## 2008-01-06 LAB — PROTIME-INR (CHCC SATELLITE)

## 2008-01-13 LAB — PROTIME-INR (CHCC SATELLITE): INR: 1.9 — ABNORMAL LOW (ref 2.0–3.5)

## 2008-01-19 ENCOUNTER — Ambulatory Visit: Payer: Self-pay | Admitting: Internal Medicine

## 2008-01-19 DIAGNOSIS — R7402 Elevation of levels of lactic acid dehydrogenase (LDH): Secondary | ICD-10-CM | POA: Insufficient documentation

## 2008-01-19 DIAGNOSIS — M949 Disorder of cartilage, unspecified: Secondary | ICD-10-CM

## 2008-01-19 DIAGNOSIS — R740 Nonspecific elevation of levels of transaminase and lactic acid dehydrogenase [LDH]: Secondary | ICD-10-CM

## 2008-01-19 DIAGNOSIS — E559 Vitamin D deficiency, unspecified: Secondary | ICD-10-CM | POA: Insufficient documentation

## 2008-01-19 DIAGNOSIS — M899 Disorder of bone, unspecified: Secondary | ICD-10-CM | POA: Insufficient documentation

## 2008-01-19 DIAGNOSIS — N259 Disorder resulting from impaired renal tubular function, unspecified: Secondary | ICD-10-CM | POA: Insufficient documentation

## 2008-01-19 DIAGNOSIS — R7401 Elevation of levels of liver transaminase levels: Secondary | ICD-10-CM | POA: Insufficient documentation

## 2008-01-20 LAB — PROTIME-INR (CHCC SATELLITE)
INR: 2.1 (ref 2.0–3.5)
Protime: 25.2 Seconds — ABNORMAL HIGH (ref 10.6–13.4)

## 2008-01-24 ENCOUNTER — Encounter: Payer: Self-pay | Admitting: Internal Medicine

## 2008-01-26 ENCOUNTER — Telehealth: Payer: Self-pay | Admitting: Internal Medicine

## 2008-01-26 ENCOUNTER — Encounter: Payer: Self-pay | Admitting: Internal Medicine

## 2008-01-31 ENCOUNTER — Encounter: Payer: Self-pay | Admitting: Internal Medicine

## 2008-02-01 ENCOUNTER — Ambulatory Visit: Payer: Self-pay | Admitting: Oncology

## 2008-02-03 LAB — CBC WITH DIFFERENTIAL (CANCER CENTER ONLY)
BASO%: 0.6 % (ref 0.0–2.0)
EOS%: 8.4 % — ABNORMAL HIGH (ref 0.0–7.0)
HGB: 10.7 g/dL — ABNORMAL LOW (ref 11.6–15.9)
LYMPH#: 3.1 10*3/uL (ref 0.9–3.3)
MCHC: 34.3 g/dL (ref 32.0–36.0)
MONO#: 0.5 10*3/uL (ref 0.1–0.9)
NEUT#: 2.6 10*3/uL (ref 1.5–6.5)
RDW: 12.8 % (ref 10.5–14.6)
WBC: 6.8 10*3/uL (ref 3.9–10.0)

## 2008-02-03 LAB — PROTIME-INR (CHCC SATELLITE): Protime: 24 Seconds — ABNORMAL HIGH (ref 10.6–13.4)

## 2008-02-10 LAB — PROTIME-INR (CHCC SATELLITE): Protime: 25.2 Seconds — ABNORMAL HIGH (ref 10.6–13.4)

## 2008-02-15 ENCOUNTER — Telehealth: Payer: Self-pay | Admitting: Internal Medicine

## 2008-02-17 LAB — PROTIME-INR (CHCC SATELLITE): INR: 2.5 (ref 2.0–3.5)

## 2008-02-22 ENCOUNTER — Encounter: Payer: Self-pay | Admitting: Internal Medicine

## 2008-02-24 LAB — PROTIME-INR (CHCC SATELLITE): INR: 2.4 (ref 2.0–3.5)

## 2008-03-02 LAB — CBC WITH DIFFERENTIAL (CANCER CENTER ONLY)
BASO%: 1.2 % (ref 0.0–2.0)
EOS%: 11.1 % — ABNORMAL HIGH (ref 0.0–7.0)
HCT: 34.5 % — ABNORMAL LOW (ref 34.8–46.6)
LYMPH#: 2.6 10*3/uL (ref 0.9–3.3)
LYMPH%: 49 % — ABNORMAL HIGH (ref 14.0–48.0)
MCHC: 33.3 g/dL (ref 32.0–36.0)
NEUT%: 31.5 % — ABNORMAL LOW (ref 39.6–80.0)
Platelets: 168 10*3/uL (ref 145–400)
RDW: 13.6 % (ref 10.5–14.6)

## 2008-03-02 LAB — PROTIME-INR (CHCC SATELLITE): Protime: 31.2 Seconds — ABNORMAL HIGH (ref 10.6–13.4)

## 2008-03-07 ENCOUNTER — Encounter: Payer: Self-pay | Admitting: Internal Medicine

## 2008-03-16 LAB — PROTIME-INR (CHCC SATELLITE)

## 2008-03-21 ENCOUNTER — Ambulatory Visit: Payer: Self-pay | Admitting: Oncology

## 2008-03-23 LAB — PROTIME-INR (CHCC SATELLITE)

## 2008-03-25 ENCOUNTER — Encounter: Payer: Self-pay | Admitting: Internal Medicine

## 2008-03-31 LAB — PROTIME-INR (CHCC SATELLITE)
INR: 2 (ref 2.0–3.5)
Protime: 24 Seconds — ABNORMAL HIGH (ref 10.6–13.4)

## 2008-03-31 LAB — CBC WITH DIFFERENTIAL (CANCER CENTER ONLY)
BASO%: 1.1 % (ref 0.0–2.0)
EOS%: 11.1 % — ABNORMAL HIGH (ref 0.0–7.0)
HCT: 37 % (ref 34.8–46.6)
LYMPH%: 48.8 % — ABNORMAL HIGH (ref 14.0–48.0)
MCH: 30.5 pg (ref 26.0–34.0)
MCHC: 33.3 g/dL (ref 32.0–36.0)
MCV: 92 fL (ref 81–101)
MONO#: 0.6 10*3/uL (ref 0.1–0.9)
MONO%: 9.9 % (ref 0.0–13.0)
NEUT%: 29.1 % — ABNORMAL LOW (ref 39.6–80.0)
Platelets: 126 10*3/uL — ABNORMAL LOW (ref 145–400)
RDW: 13.4 % (ref 10.5–14.6)

## 2008-04-13 LAB — PROTIME-INR (CHCC SATELLITE)
INR: 2.3 (ref 2.0–3.5)
Protime: 27.6 Seconds — ABNORMAL HIGH (ref 10.6–13.4)

## 2008-04-25 ENCOUNTER — Encounter: Payer: Self-pay | Admitting: Internal Medicine

## 2008-04-27 ENCOUNTER — Encounter: Payer: Self-pay | Admitting: Internal Medicine

## 2008-04-27 LAB — PROTIME-INR (CHCC SATELLITE)
INR: 2.3 (ref 2.0–3.5)
Protime: 27.6 Seconds — ABNORMAL HIGH (ref 10.6–13.4)

## 2008-04-27 LAB — CBC WITH DIFFERENTIAL (CANCER CENTER ONLY)
BASO#: 0.1 10*3/uL (ref 0.0–0.2)
Eosinophils Absolute: 0.5 10*3/uL (ref 0.0–0.5)
HCT: 36.3 % (ref 34.8–46.6)
LYMPH%: 45.6 % (ref 14.0–48.0)
MCH: 29.6 pg (ref 26.0–34.0)
MCV: 91 fL (ref 81–101)
MONO#: 0.7 10*3/uL (ref 0.1–0.9)
MONO%: 10.5 % (ref 0.0–13.0)
NEUT%: 34.8 % — ABNORMAL LOW (ref 39.6–80.0)
RBC: 3.99 10*6/uL (ref 3.70–5.32)
WBC: 6.4 10*3/uL (ref 3.9–10.0)

## 2008-05-22 ENCOUNTER — Encounter: Payer: Self-pay | Admitting: Internal Medicine

## 2008-05-23 ENCOUNTER — Ambulatory Visit: Payer: Self-pay | Admitting: Oncology

## 2008-05-25 LAB — CBC WITH DIFFERENTIAL (CANCER CENTER ONLY)
BASO#: 0 10*3/uL (ref 0.0–0.2)
Eosinophils Absolute: 0.4 10*3/uL (ref 0.0–0.5)
HGB: 11.5 g/dL — ABNORMAL LOW (ref 11.6–15.9)
MCH: 30.8 pg (ref 26.0–34.0)
MCV: 94 fL (ref 81–101)
MONO#: 0.5 10*3/uL (ref 0.1–0.9)
MONO%: 8.8 % (ref 0.0–13.0)
NEUT#: 2 10*3/uL (ref 1.5–6.5)
Platelets: 123 10*3/uL — ABNORMAL LOW (ref 145–400)
RBC: 3.74 10*6/uL (ref 3.70–5.32)
WBC: 5.6 10*3/uL (ref 3.9–10.0)

## 2008-05-25 LAB — PROTIME-INR (CHCC SATELLITE)
INR: 1.9 — ABNORMAL LOW (ref 2.0–3.5)
Protime: 22.8 Seconds — ABNORMAL HIGH (ref 10.6–13.4)

## 2008-05-29 ENCOUNTER — Encounter: Payer: Self-pay | Admitting: Internal Medicine

## 2008-06-19 ENCOUNTER — Encounter: Payer: Self-pay | Admitting: Internal Medicine

## 2008-07-03 ENCOUNTER — Encounter: Payer: Self-pay | Admitting: Internal Medicine

## 2008-07-06 ENCOUNTER — Encounter: Payer: Self-pay | Admitting: Internal Medicine

## 2008-07-18 ENCOUNTER — Ambulatory Visit: Payer: Self-pay | Admitting: Oncology

## 2008-07-20 LAB — CBC WITH DIFFERENTIAL (CANCER CENTER ONLY)
EOS%: 9.1 % — ABNORMAL HIGH (ref 0.0–7.0)
LYMPH%: 42.8 % (ref 14.0–48.0)
MCH: 31.9 pg (ref 26.0–34.0)
MCHC: 33 g/dL (ref 32.0–36.0)
MONO%: 10.8 % (ref 0.0–13.0)
NEUT#: 2.2 10*3/uL (ref 1.5–6.5)
Platelets: 154 10*3/uL (ref 145–400)
RDW: 11.5 % (ref 10.5–14.6)

## 2008-07-20 LAB — PROTIME-INR (CHCC SATELLITE)
INR: 2.3 (ref 2.0–3.5)
Protime: 27.6 Seconds — ABNORMAL HIGH (ref 10.6–13.4)

## 2008-07-24 ENCOUNTER — Encounter: Payer: Self-pay | Admitting: Internal Medicine

## 2008-08-17 ENCOUNTER — Encounter: Payer: Self-pay | Admitting: Internal Medicine

## 2008-08-17 LAB — CBC WITH DIFFERENTIAL (CANCER CENTER ONLY)
BASO#: 0.1 10*3/uL (ref 0.0–0.2)
EOS%: 8.5 % — ABNORMAL HIGH (ref 0.0–7.0)
HCT: 37.5 % (ref 34.8–46.6)
HGB: 12.6 g/dL (ref 11.6–15.9)
LYMPH#: 2.7 10*3/uL (ref 0.9–3.3)
MCHC: 33.6 g/dL (ref 32.0–36.0)
NEUT%: 36.7 % — ABNORMAL LOW (ref 39.6–80.0)

## 2008-08-17 LAB — PROTIME-INR (CHCC SATELLITE): INR: 3.3 (ref 2.0–3.5)

## 2008-08-21 ENCOUNTER — Encounter: Payer: Self-pay | Admitting: Internal Medicine

## 2008-08-29 ENCOUNTER — Encounter: Payer: Self-pay | Admitting: Internal Medicine

## 2008-09-05 ENCOUNTER — Ambulatory Visit: Payer: Self-pay | Admitting: Oncology

## 2008-09-05 ENCOUNTER — Encounter: Payer: Self-pay | Admitting: Internal Medicine

## 2008-09-05 LAB — CMP (CANCER CENTER ONLY)
ALT(SGPT): 79 U/L — ABNORMAL HIGH (ref 10–47)
Alkaline Phosphatase: 161 U/L — ABNORMAL HIGH (ref 26–84)
Creat: 0.7 mg/dl (ref 0.6–1.2)
Glucose, Bld: 92 mg/dL (ref 73–118)
Sodium: 142 mEq/L (ref 128–145)
Total Bilirubin: 0.4 mg/dl (ref 0.20–1.60)
Total Protein: 8.3 g/dL — ABNORMAL HIGH (ref 6.4–8.1)

## 2008-09-05 LAB — CBC WITH DIFFERENTIAL (CANCER CENTER ONLY)
BASO#: 0.1 10*3/uL (ref 0.0–0.2)
EOS%: 10.3 % — ABNORMAL HIGH (ref 0.0–7.0)
HCT: 36.1 % (ref 34.8–46.6)
HGB: 12.3 g/dL (ref 11.6–15.9)
LYMPH%: 46.4 % (ref 14.0–48.0)
MCH: 32.3 pg (ref 26.0–34.0)
MCHC: 34.2 g/dL (ref 32.0–36.0)
MCV: 94 fL (ref 81–101)
MONO%: 8.7 % (ref 0.0–13.0)
NEUT#: 1.9 10*3/uL (ref 1.5–6.5)
NEUT%: 33.8 % — ABNORMAL LOW (ref 39.6–80.0)

## 2008-09-05 LAB — PROTIME-INR (CHCC SATELLITE): Protime: 31.2 Seconds — ABNORMAL HIGH (ref 10.6–13.4)

## 2008-09-14 ENCOUNTER — Encounter: Payer: Self-pay | Admitting: Internal Medicine

## 2008-09-25 ENCOUNTER — Encounter: Payer: Self-pay | Admitting: Internal Medicine

## 2008-10-12 ENCOUNTER — Ambulatory Visit: Payer: Self-pay | Admitting: Internal Medicine

## 2008-10-12 DIAGNOSIS — R748 Abnormal levels of other serum enzymes: Secondary | ICD-10-CM | POA: Insufficient documentation

## 2008-10-12 DIAGNOSIS — M84453A Pathological fracture, unspecified femur, initial encounter for fracture: Secondary | ICD-10-CM | POA: Insufficient documentation

## 2008-10-12 DIAGNOSIS — N958 Other specified menopausal and perimenopausal disorders: Secondary | ICD-10-CM | POA: Insufficient documentation

## 2008-10-12 LAB — CBC WITH DIFFERENTIAL (CANCER CENTER ONLY)
EOS%: 6 % (ref 0.0–7.0)
Eosinophils Absolute: 0.3 10*3/uL (ref 0.0–0.5)
LYMPH%: 47.2 % (ref 14.0–48.0)
MCH: 32.5 pg (ref 26.0–34.0)
MCHC: 35 g/dL (ref 32.0–36.0)
MCV: 93 fL (ref 81–101)
MONO%: 9.4 % (ref 0.0–13.0)
NEUT#: 2.1 10*3/uL (ref 1.5–6.5)
Platelets: 147 10*3/uL (ref 145–400)
RDW: 12.3 % (ref 10.5–14.6)

## 2008-10-18 ENCOUNTER — Encounter: Payer: Self-pay | Admitting: Internal Medicine

## 2008-10-23 ENCOUNTER — Encounter: Payer: Self-pay | Admitting: Internal Medicine

## 2008-10-31 ENCOUNTER — Encounter: Payer: Self-pay | Admitting: Internal Medicine

## 2008-11-07 ENCOUNTER — Ambulatory Visit: Payer: Self-pay | Admitting: Oncology

## 2008-11-09 LAB — CBC WITH DIFFERENTIAL (CANCER CENTER ONLY)
BASO#: 0.1 10*3/uL (ref 0.0–0.2)
BASO%: 0.8 % (ref 0.0–2.0)
HCT: 34.4 % — ABNORMAL LOW (ref 34.8–46.6)
HGB: 11.6 g/dL (ref 11.6–15.9)
LYMPH#: 2.4 10*3/uL (ref 0.9–3.3)
MONO#: 0.6 10*3/uL (ref 0.1–0.9)
NEUT#: 2.3 10*3/uL (ref 1.5–6.5)
NEUT%: 40.2 % (ref 39.6–80.0)
RDW: 12.6 % (ref 10.5–14.6)
WBC: 5.8 10*3/uL (ref 3.9–10.0)

## 2008-11-09 LAB — PROTIME-INR (CHCC SATELLITE)

## 2008-11-16 ENCOUNTER — Encounter: Payer: Self-pay | Admitting: Internal Medicine

## 2008-11-19 DIAGNOSIS — K219 Gastro-esophageal reflux disease without esophagitis: Secondary | ICD-10-CM | POA: Insufficient documentation

## 2008-11-20 ENCOUNTER — Encounter: Payer: Self-pay | Admitting: Internal Medicine

## 2008-11-20 ENCOUNTER — Ambulatory Visit: Payer: Self-pay | Admitting: Family Medicine

## 2008-11-21 ENCOUNTER — Ambulatory Visit: Payer: Self-pay | Admitting: Internal Medicine

## 2008-11-21 ENCOUNTER — Encounter: Payer: Self-pay | Admitting: Internal Medicine

## 2008-11-23 LAB — PROTIME-INR (CHCC SATELLITE)
INR: 1.7 — ABNORMAL LOW (ref 2.0–3.5)
Protime: 20.4 Seconds — ABNORMAL HIGH (ref 10.6–13.4)

## 2008-12-04 ENCOUNTER — Ambulatory Visit: Payer: Self-pay | Admitting: Gastroenterology

## 2008-12-04 ENCOUNTER — Encounter: Payer: Self-pay | Admitting: Internal Medicine

## 2008-12-06 ENCOUNTER — Ambulatory Visit: Payer: Self-pay | Admitting: Oncology

## 2008-12-07 LAB — CBC WITH DIFFERENTIAL (CANCER CENTER ONLY)
BASO#: 0.1 10*3/uL (ref 0.0–0.2)
EOS%: 11.4 % — ABNORMAL HIGH (ref 0.0–7.0)
Eosinophils Absolute: 0.7 10*3/uL — ABNORMAL HIGH (ref 0.0–0.5)
HCT: 36.9 % (ref 34.8–46.6)
HGB: 12.7 g/dL (ref 11.6–15.9)
LYMPH%: 40.7 % (ref 14.0–48.0)
MCH: 32.3 pg (ref 26.0–34.0)
MCHC: 34.3 g/dL (ref 32.0–36.0)
MCV: 94 fL (ref 81–101)
MONO%: 10.1 % (ref 0.0–13.0)
NEUT#: 2.4 10*3/uL (ref 1.5–6.5)
NEUT%: 36.9 % — ABNORMAL LOW (ref 39.6–80.0)
RBC: 3.93 10*6/uL (ref 3.70–5.32)

## 2008-12-07 LAB — PROTIME-INR (CHCC SATELLITE)
INR: 1.8 — ABNORMAL LOW (ref 2.0–3.5)
Protime: 21.6 Seconds — ABNORMAL HIGH (ref 10.6–13.4)

## 2008-12-11 ENCOUNTER — Ambulatory Visit (HOSPITAL_COMMUNITY): Admission: RE | Admit: 2008-12-11 | Discharge: 2008-12-11 | Payer: Self-pay | Admitting: Gastroenterology

## 2008-12-11 ENCOUNTER — Encounter: Payer: Self-pay | Admitting: Internal Medicine

## 2008-12-11 ENCOUNTER — Encounter (INDEPENDENT_AMBULATORY_CARE_PROVIDER_SITE_OTHER): Payer: Self-pay | Admitting: *Deleted

## 2008-12-14 ENCOUNTER — Telehealth (INDEPENDENT_AMBULATORY_CARE_PROVIDER_SITE_OTHER): Payer: Self-pay

## 2008-12-14 ENCOUNTER — Encounter: Payer: Self-pay | Admitting: Family Medicine

## 2008-12-18 ENCOUNTER — Ambulatory Visit: Payer: Self-pay | Admitting: Internal Medicine

## 2008-12-21 LAB — PROTIME-INR (CHCC SATELLITE)
INR: 1.9 — ABNORMAL LOW (ref 2.0–3.5)
Protime: 22.8 Seconds — ABNORMAL HIGH (ref 10.6–13.4)

## 2008-12-31 ENCOUNTER — Encounter: Payer: Self-pay | Admitting: Internal Medicine

## 2009-01-01 ENCOUNTER — Encounter: Payer: Self-pay | Admitting: Internal Medicine

## 2009-01-04 ENCOUNTER — Ambulatory Visit: Payer: Self-pay | Admitting: Family Medicine

## 2009-01-04 LAB — CBC WITH DIFFERENTIAL (CANCER CENTER ONLY)
BASO#: 0.1 10*3/uL (ref 0.0–0.2)
EOS%: 7.5 % — ABNORMAL HIGH (ref 0.0–7.0)
Eosinophils Absolute: 0.5 10*3/uL (ref 0.0–0.5)
HCT: 37.4 % (ref 34.8–46.6)
HGB: 12.7 g/dL (ref 11.6–15.9)
LYMPH#: 2.7 10*3/uL (ref 0.9–3.3)
MCHC: 33.8 g/dL (ref 32.0–36.0)
NEUT#: 2.6 10*3/uL (ref 1.5–6.5)
NEUT%: 40.3 % (ref 39.6–80.0)
RBC: 3.98 10*6/uL (ref 3.70–5.32)

## 2009-01-05 LAB — CONVERTED CEMR LAB
CO2: 34 meq/L — ABNORMAL HIGH (ref 19–32)
Calcium: 8.8 mg/dL (ref 8.4–10.5)
Chloride: 102 meq/L (ref 96–112)
Glucose, Bld: 85 mg/dL (ref 70–99)
Sodium: 140 meq/L (ref 135–145)

## 2009-01-08 ENCOUNTER — Encounter: Payer: Self-pay | Admitting: Family Medicine

## 2009-01-09 ENCOUNTER — Telehealth (INDEPENDENT_AMBULATORY_CARE_PROVIDER_SITE_OTHER): Payer: Self-pay

## 2009-01-10 ENCOUNTER — Ambulatory Visit: Payer: Self-pay | Admitting: Internal Medicine

## 2009-01-11 ENCOUNTER — Ambulatory Visit (HOSPITAL_COMMUNITY): Admission: RE | Admit: 2009-01-11 | Discharge: 2009-01-11 | Payer: Self-pay | Admitting: Family Medicine

## 2009-01-16 ENCOUNTER — Ambulatory Visit: Payer: Self-pay | Admitting: Oncology

## 2009-01-17 ENCOUNTER — Ambulatory Visit: Payer: Self-pay | Admitting: Internal Medicine

## 2009-01-18 ENCOUNTER — Encounter: Payer: Self-pay | Admitting: Internal Medicine

## 2009-01-19 LAB — PROTIME-INR (CHCC SATELLITE)
INR: 2.3 (ref 2.0–3.5)
Protime: 27.6 s — ABNORMAL HIGH (ref 10.6–13.4)

## 2009-01-22 ENCOUNTER — Encounter: Payer: Self-pay | Admitting: Internal Medicine

## 2009-01-22 ENCOUNTER — Ambulatory Visit: Payer: Self-pay | Admitting: Internal Medicine

## 2009-01-22 HISTORY — PX: ESOPHAGOGASTRODUODENOSCOPY: SHX1529

## 2009-01-29 ENCOUNTER — Telehealth: Payer: Self-pay | Admitting: Internal Medicine

## 2009-02-01 LAB — CBC WITH DIFFERENTIAL (CANCER CENTER ONLY)
BASO%: 0.9 % (ref 0.0–2.0)
EOS%: 7.3 % — ABNORMAL HIGH (ref 0.0–7.0)
LYMPH#: 2.2 10*3/uL (ref 0.9–3.3)
MONO#: 0.5 10*3/uL (ref 0.1–0.9)
NEUT#: 2.2 10*3/uL (ref 1.5–6.5)
Platelets: 125 10*3/uL — ABNORMAL LOW (ref 145–400)
RDW: 13 % (ref 10.5–14.6)
WBC: 5.3 10*3/uL (ref 3.9–10.0)

## 2009-02-07 ENCOUNTER — Ambulatory Visit: Payer: Self-pay | Admitting: Internal Medicine

## 2009-02-14 ENCOUNTER — Encounter: Payer: Self-pay | Admitting: Internal Medicine

## 2009-02-20 ENCOUNTER — Ambulatory Visit: Payer: Self-pay | Admitting: Family Medicine

## 2009-02-20 ENCOUNTER — Encounter: Payer: Self-pay | Admitting: Internal Medicine

## 2009-02-20 ENCOUNTER — Other Ambulatory Visit: Admission: RE | Admit: 2009-02-20 | Discharge: 2009-02-20 | Payer: Self-pay | Admitting: Family Medicine

## 2009-02-20 ENCOUNTER — Encounter: Payer: Self-pay | Admitting: Family Medicine

## 2009-02-20 DIAGNOSIS — D239 Other benign neoplasm of skin, unspecified: Secondary | ICD-10-CM | POA: Insufficient documentation

## 2009-02-22 ENCOUNTER — Encounter (INDEPENDENT_AMBULATORY_CARE_PROVIDER_SITE_OTHER): Payer: Self-pay | Admitting: *Deleted

## 2009-02-28 ENCOUNTER — Ambulatory Visit: Payer: Self-pay | Admitting: Oncology

## 2009-03-01 ENCOUNTER — Encounter: Payer: Self-pay | Admitting: Internal Medicine

## 2009-03-01 LAB — CBC WITH DIFFERENTIAL (CANCER CENTER ONLY)
BASO%: 0.8 % (ref 0.0–2.0)
EOS%: 8.6 % — ABNORMAL HIGH (ref 0.0–7.0)
LYMPH%: 39.4 % (ref 14.0–48.0)
MCH: 32.5 pg (ref 26.0–34.0)
MCHC: 33.3 g/dL (ref 32.0–36.0)
MCV: 98 fL (ref 81–101)
MONO#: 0.6 10*3/uL (ref 0.1–0.9)
MONO%: 9.1 % (ref 0.0–13.0)
Platelets: 163 10*3/uL (ref 145–400)
RDW: 12.3 % (ref 10.5–14.6)
WBC: 6.3 10*3/uL (ref 3.9–10.0)

## 2009-03-01 LAB — PROTIME-INR (CHCC SATELLITE)
INR: 2.9 (ref 2.0–3.5)
Protime: 34.8 Seconds — ABNORMAL HIGH (ref 10.6–13.4)

## 2009-03-01 LAB — CMP (CANCER CENTER ONLY)
ALT(SGPT): 49 U/L — ABNORMAL HIGH (ref 10–47)
CO2: 25 mEq/L (ref 18–33)
Creat: 0.5 mg/dl — ABNORMAL LOW (ref 0.6–1.2)
Glucose, Bld: 93 mg/dL (ref 73–118)
Total Bilirubin: 0.4 mg/dl (ref 0.20–1.60)

## 2009-03-07 ENCOUNTER — Encounter (INDEPENDENT_AMBULATORY_CARE_PROVIDER_SITE_OTHER): Payer: Self-pay | Admitting: *Deleted

## 2009-03-12 ENCOUNTER — Encounter (INDEPENDENT_AMBULATORY_CARE_PROVIDER_SITE_OTHER): Payer: Self-pay | Admitting: *Deleted

## 2009-03-19 ENCOUNTER — Encounter: Payer: Self-pay | Admitting: Internal Medicine

## 2009-03-27 ENCOUNTER — Ambulatory Visit: Payer: Self-pay | Admitting: Oncology

## 2009-03-29 LAB — CBC WITH DIFFERENTIAL (CANCER CENTER ONLY)
Eosinophils Absolute: 0.5 10*3/uL (ref 0.0–0.5)
LYMPH%: 41.3 % (ref 14.0–48.0)
MCH: 31.7 pg (ref 26.0–34.0)
MCV: 98 fL (ref 81–101)
MONO%: 7.4 % (ref 0.0–13.0)
Platelets: 146 10*3/uL (ref 145–400)
RBC: 3.57 10*6/uL — ABNORMAL LOW (ref 3.70–5.32)

## 2009-03-29 LAB — PROTIME-INR (CHCC SATELLITE)

## 2009-04-16 ENCOUNTER — Encounter: Payer: Self-pay | Admitting: Internal Medicine

## 2009-04-23 ENCOUNTER — Ambulatory Visit: Payer: Self-pay | Admitting: Oncology

## 2009-04-25 ENCOUNTER — Encounter: Payer: Self-pay | Admitting: Internal Medicine

## 2009-04-27 LAB — PROTIME-INR (CHCC SATELLITE): INR: 2.4 (ref 2.0–3.5)

## 2009-04-27 LAB — CBC WITH DIFFERENTIAL (CANCER CENTER ONLY)
BASO#: 0 10*3/uL (ref 0.0–0.2)
Eosinophils Absolute: 0.5 10*3/uL (ref 0.0–0.5)
HCT: 34.8 % (ref 34.8–46.6)
HGB: 11.4 g/dL — ABNORMAL LOW (ref 11.6–15.9)
LYMPH%: 29.8 % (ref 14.0–48.0)
MCH: 31.2 pg (ref 26.0–34.0)
MCV: 95 fL (ref 81–101)
MONO%: 9.7 % (ref 0.0–13.0)
NEUT#: 3.6 10*3/uL (ref 1.5–6.5)
RBC: 3.66 10*6/uL — ABNORMAL LOW (ref 3.70–5.32)

## 2009-05-02 ENCOUNTER — Encounter: Payer: Self-pay | Admitting: Internal Medicine

## 2009-05-18 ENCOUNTER — Encounter: Payer: Self-pay | Admitting: Internal Medicine

## 2009-05-22 ENCOUNTER — Ambulatory Visit: Payer: Self-pay | Admitting: Oncology

## 2009-05-24 LAB — CBC WITH DIFFERENTIAL (CANCER CENTER ONLY)
BASO#: 0.1 10*3/uL (ref 0.0–0.2)
HCT: 36.1 % (ref 34.8–46.6)
HGB: 11.8 g/dL (ref 11.6–15.9)
LYMPH#: 2.4 10*3/uL (ref 0.9–3.3)
MCV: 94 fL (ref 81–101)
MONO#: 0.6 10*3/uL (ref 0.1–0.9)
NEUT%: 40.6 % (ref 39.6–80.0)
WBC: 6.8 10*3/uL (ref 3.9–10.0)

## 2009-05-24 LAB — PROTIME-INR (CHCC SATELLITE)

## 2009-06-14 ENCOUNTER — Encounter: Payer: Self-pay | Admitting: Internal Medicine

## 2009-06-20 ENCOUNTER — Ambulatory Visit: Payer: Self-pay | Admitting: Oncology

## 2009-06-21 ENCOUNTER — Encounter: Payer: Self-pay | Admitting: Internal Medicine

## 2009-06-21 LAB — CBC WITH DIFFERENTIAL (CANCER CENTER ONLY)
BASO#: 0.1 10e3/uL (ref 0.0–0.2)
BASO%: 0.8 % (ref 0.0–2.0)
EOS%: 9.4 % — ABNORMAL HIGH (ref 0.0–7.0)
Eosinophils Absolute: 0.6 10e3/uL — ABNORMAL HIGH (ref 0.0–0.5)
HCT: 35.6 % (ref 34.8–46.6)
HGB: 11.5 g/dL — ABNORMAL LOW (ref 11.6–15.9)
LYMPH#: 2.1 10e3/uL (ref 0.9–3.3)
LYMPH%: 33.6 % (ref 14.0–48.0)
MCH: 30.3 pg (ref 26.0–34.0)
MCHC: 32.3 g/dL (ref 32.0–36.0)
MCV: 94 fL (ref 81–101)
MONO#: 0.6 10e3/uL (ref 0.1–0.9)
MONO%: 8.9 % (ref 0.0–13.0)
NEUT#: 3 10e3/uL (ref 1.5–6.5)
NEUT%: 47.3 % (ref 39.6–80.0)
Platelets: 141 10e3/uL — ABNORMAL LOW (ref 145–400)
RBC: 3.79 10e6/uL (ref 3.70–5.32)
RDW: 13.3 % (ref 10.5–14.6)
WBC: 6.3 10e3/uL (ref 3.9–10.0)

## 2009-07-12 ENCOUNTER — Encounter: Payer: Self-pay | Admitting: Internal Medicine

## 2009-07-16 ENCOUNTER — Ambulatory Visit: Payer: Self-pay | Admitting: Oncology

## 2009-07-19 LAB — CBC WITH DIFFERENTIAL (CANCER CENTER ONLY)
BASO%: 0.8 % (ref 0.0–2.0)
HCT: 33.7 % — ABNORMAL LOW (ref 34.8–46.6)
LYMPH%: 31.8 % (ref 14.0–48.0)
MCH: 30.5 pg (ref 26.0–34.0)
MCV: 91 fL (ref 81–101)
MONO#: 0.6 10*3/uL (ref 0.1–0.9)
MONO%: 9.2 % (ref 0.0–13.0)
NEUT%: 49.1 % (ref 39.6–80.0)
Platelets: 132 10*3/uL — ABNORMAL LOW (ref 145–400)
RDW: 13.3 % (ref 10.5–14.6)
WBC: 6 10*3/uL (ref 3.9–10.0)

## 2009-07-19 LAB — PROTIME-INR (CHCC SATELLITE): Protime: 27.6 Seconds — ABNORMAL HIGH (ref 10.6–13.4)

## 2009-08-10 ENCOUNTER — Ambulatory Visit: Payer: Self-pay | Admitting: Oncology

## 2009-08-13 ENCOUNTER — Encounter: Payer: Self-pay | Admitting: Internal Medicine

## 2009-08-16 ENCOUNTER — Encounter: Payer: Self-pay | Admitting: Internal Medicine

## 2009-08-16 LAB — CBC WITH DIFFERENTIAL (CANCER CENTER ONLY)
BASO#: 0 10*3/uL (ref 0.0–0.2)
Eosinophils Absolute: 0.5 10*3/uL (ref 0.0–0.5)
HGB: 12.2 g/dL (ref 11.6–15.9)
LYMPH#: 1.9 10*3/uL (ref 0.9–3.3)
MCH: 30.7 pg (ref 26.0–34.0)
MONO#: 0.4 10*3/uL (ref 0.1–0.9)
NEUT#: 2.6 10*3/uL (ref 1.5–6.5)
Platelets: 142 10*3/uL — ABNORMAL LOW (ref 145–400)
RBC: 3.96 10*6/uL (ref 3.70–5.32)
WBC: 5.4 10*3/uL (ref 3.9–10.0)

## 2009-08-16 LAB — FERRITIN: Ferritin: 213 ng/mL (ref 10–291)

## 2009-08-16 LAB — PROTIME-INR (CHCC SATELLITE)
INR: 2.5 (ref 2.0–3.5)
Protime: 30 Seconds — ABNORMAL HIGH (ref 10.6–13.4)

## 2009-08-16 LAB — IRON AND TIBC
Iron: 45 ug/dL (ref 42–145)
UIBC: 221 ug/dL

## 2009-08-27 ENCOUNTER — Encounter: Payer: Self-pay | Admitting: Internal Medicine

## 2009-08-29 ENCOUNTER — Telehealth: Payer: Self-pay | Admitting: Physician Assistant

## 2009-09-10 ENCOUNTER — Ambulatory Visit: Payer: Self-pay | Admitting: Oncology

## 2009-09-13 ENCOUNTER — Encounter: Payer: Self-pay | Admitting: Internal Medicine

## 2009-09-13 LAB — CBC WITH DIFFERENTIAL (CANCER CENTER ONLY)
BASO#: 0.1 10*3/uL (ref 0.0–0.2)
Eosinophils Absolute: 0.5 10*3/uL (ref 0.0–0.5)
HGB: 11.3 g/dL — ABNORMAL LOW (ref 11.6–15.9)
LYMPH%: 36.3 % (ref 14.0–48.0)
MCV: 90 fL (ref 81–101)
MONO#: 0.5 10*3/uL (ref 0.1–0.9)
Platelets: 108 10*3/uL — ABNORMAL LOW (ref 145–400)
RBC: 3.65 10*6/uL — ABNORMAL LOW (ref 3.70–5.32)
WBC: 5.9 10*3/uL (ref 3.9–10.0)

## 2009-09-13 LAB — PROTIME-INR (CHCC SATELLITE): Protime: 24 Seconds — ABNORMAL HIGH (ref 10.6–13.4)

## 2009-09-17 ENCOUNTER — Encounter: Payer: Self-pay | Admitting: Internal Medicine

## 2009-09-24 ENCOUNTER — Encounter: Payer: Self-pay | Admitting: Internal Medicine

## 2009-10-01 ENCOUNTER — Ambulatory Visit: Payer: Self-pay | Admitting: Internal Medicine

## 2009-10-01 DIAGNOSIS — R5383 Other fatigue: Secondary | ICD-10-CM

## 2009-10-01 DIAGNOSIS — R5381 Other malaise: Secondary | ICD-10-CM | POA: Insufficient documentation

## 2009-10-01 DIAGNOSIS — D638 Anemia in other chronic diseases classified elsewhere: Secondary | ICD-10-CM | POA: Insufficient documentation

## 2009-10-02 LAB — CONVERTED CEMR LAB
Alkaline Phosphatase: 146 units/L — ABNORMAL HIGH (ref 39–117)
BUN: 23 mg/dL (ref 6–23)
Basophils Absolute: 0 10*3/uL (ref 0.0–0.1)
Creatinine, Ser: 0.5 mg/dL (ref 0.4–1.2)
Eosinophils Absolute: 0.4 10*3/uL (ref 0.0–0.7)
Glucose, Bld: 95 mg/dL (ref 70–99)
HCT: 34.1 % — ABNORMAL LOW (ref 36.0–46.0)
Hemoglobin: 11.3 g/dL — ABNORMAL LOW (ref 12.0–15.0)
Lymphocytes Relative: 19.4 % (ref 12.0–46.0)
Lymphs Abs: 1.8 10*3/uL (ref 0.7–4.0)
MCHC: 33.1 g/dL (ref 30.0–36.0)
Neutro Abs: 6.2 10*3/uL (ref 1.4–7.7)
Platelets: 160 10*3/uL (ref 150.0–400.0)
RDW: 15 % — ABNORMAL HIGH (ref 11.5–14.6)
TSH: 1.74 microintl units/mL (ref 0.35–5.50)
Total Bilirubin: 0.5 mg/dL (ref 0.3–1.2)
Vitamin B-12: 576 pg/mL (ref 211–911)

## 2009-10-03 ENCOUNTER — Ambulatory Visit (HOSPITAL_COMMUNITY): Admission: RE | Admit: 2009-10-03 | Discharge: 2009-10-03 | Payer: Self-pay | Admitting: Internal Medicine

## 2009-10-06 ENCOUNTER — Emergency Department (HOSPITAL_BASED_OUTPATIENT_CLINIC_OR_DEPARTMENT_OTHER): Admission: EM | Admit: 2009-10-06 | Discharge: 2009-10-06 | Payer: Self-pay | Admitting: Emergency Medicine

## 2009-10-09 ENCOUNTER — Telehealth: Payer: Self-pay | Admitting: Internal Medicine

## 2009-10-09 ENCOUNTER — Ambulatory Visit: Payer: Self-pay | Admitting: Oncology

## 2009-10-11 ENCOUNTER — Encounter: Payer: Self-pay | Admitting: Internal Medicine

## 2009-10-11 ENCOUNTER — Ambulatory Visit: Payer: Self-pay | Admitting: Family Medicine

## 2009-10-11 DIAGNOSIS — M62838 Other muscle spasm: Secondary | ICD-10-CM | POA: Insufficient documentation

## 2009-10-11 LAB — CBC WITH DIFFERENTIAL (CANCER CENTER ONLY)
BASO#: 0.1 10*3/uL (ref 0.0–0.2)
EOS%: 8.2 % — ABNORMAL HIGH (ref 0.0–7.0)
Eosinophils Absolute: 0.6 10*3/uL — ABNORMAL HIGH (ref 0.0–0.5)
HCT: 32.4 % — ABNORMAL LOW (ref 34.8–46.6)
HGB: 10.6 g/dL — ABNORMAL LOW (ref 11.6–15.9)
LYMPH#: 1.7 10*3/uL (ref 0.9–3.3)
MCHC: 32.6 g/dL (ref 32.0–36.0)
NEUT#: 4.5 10*3/uL (ref 1.5–6.5)
NEUT%: 60.3 % (ref 39.6–80.0)
RBC: 3.48 10*6/uL — ABNORMAL LOW (ref 3.70–5.32)

## 2009-10-11 LAB — PROTIME-INR (CHCC SATELLITE): INR: 4.3 — ABNORMAL HIGH (ref 2.0–3.5)

## 2009-10-12 ENCOUNTER — Encounter: Payer: Self-pay | Admitting: Internal Medicine

## 2009-10-18 LAB — PROTIME-INR (CHCC SATELLITE)
INR: 1.7 — ABNORMAL LOW (ref 2.0–3.5)
Protime: 20.4 Seconds — ABNORMAL HIGH (ref 10.6–13.4)

## 2009-10-23 ENCOUNTER — Encounter: Admission: RE | Admit: 2009-10-23 | Discharge: 2009-12-05 | Payer: Self-pay | Admitting: Family Medicine

## 2009-10-26 ENCOUNTER — Encounter: Payer: Self-pay | Admitting: Internal Medicine

## 2009-10-26 ENCOUNTER — Telehealth (INDEPENDENT_AMBULATORY_CARE_PROVIDER_SITE_OTHER): Payer: Self-pay | Admitting: *Deleted

## 2009-10-29 ENCOUNTER — Encounter: Payer: Self-pay | Admitting: Family Medicine

## 2009-11-01 ENCOUNTER — Ambulatory Visit: Payer: Self-pay | Admitting: Oncology

## 2009-11-01 LAB — PROTIME-INR (CHCC SATELLITE): Protime: 44.4 Seconds — ABNORMAL HIGH (ref 10.6–13.4)

## 2009-11-08 LAB — CBC WITH DIFFERENTIAL (CANCER CENTER ONLY)
BASO%: 0.7 % (ref 0.0–2.0)
EOS%: 11.1 % — ABNORMAL HIGH (ref 0.0–7.0)
HCT: 32.6 % — ABNORMAL LOW (ref 34.8–46.6)
LYMPH%: 29.5 % (ref 14.0–48.0)
MCH: 30 pg (ref 26.0–34.0)
MCHC: 32.6 g/dL (ref 32.0–36.0)
MCV: 92 fL (ref 81–101)
MONO#: 0.9 10*3/uL (ref 0.1–0.9)
MONO%: 12 % (ref 0.0–13.0)
NEUT%: 46.7 % (ref 39.6–80.0)
Platelets: 151 10*3/uL (ref 145–400)
RDW: 12.7 % (ref 10.5–14.6)

## 2009-11-08 LAB — PROTIME-INR (CHCC SATELLITE): Protime: 25.2 Seconds — ABNORMAL HIGH (ref 10.6–13.4)

## 2009-11-14 ENCOUNTER — Encounter: Payer: Self-pay | Admitting: Internal Medicine

## 2009-11-15 ENCOUNTER — Telehealth (INDEPENDENT_AMBULATORY_CARE_PROVIDER_SITE_OTHER): Payer: Self-pay | Admitting: *Deleted

## 2009-11-22 ENCOUNTER — Encounter: Payer: Self-pay | Admitting: Internal Medicine

## 2009-11-30 ENCOUNTER — Telehealth: Payer: Self-pay | Admitting: Internal Medicine

## 2009-12-04 ENCOUNTER — Encounter: Payer: Self-pay | Admitting: Family Medicine

## 2009-12-04 ENCOUNTER — Ambulatory Visit: Payer: Self-pay | Admitting: Oncology

## 2009-12-06 LAB — CBC WITH DIFFERENTIAL (CANCER CENTER ONLY)
BASO#: 0.1 10*3/uL (ref 0.0–0.2)
EOS%: 5.4 % (ref 0.0–7.0)
Eosinophils Absolute: 0.4 10*3/uL (ref 0.0–0.5)
HCT: 31.6 % — ABNORMAL LOW (ref 34.8–46.6)
HGB: 10.8 g/dL — ABNORMAL LOW (ref 11.6–15.9)
LYMPH#: 2.5 10*3/uL (ref 0.9–3.3)
MCH: 30.6 pg (ref 26.0–34.0)
MCHC: 34.1 g/dL (ref 32.0–36.0)
MONO%: 9.3 % (ref 0.0–13.0)
NEUT%: 52.9 % (ref 39.6–80.0)
RBC: 3.52 10*6/uL — ABNORMAL LOW (ref 3.70–5.32)

## 2009-12-06 LAB — PROTIME-INR (CHCC SATELLITE): INR: 1.7 — ABNORMAL LOW (ref 2.0–3.5)

## 2009-12-11 ENCOUNTER — Encounter: Payer: Self-pay | Admitting: Internal Medicine

## 2009-12-13 ENCOUNTER — Encounter: Payer: Self-pay | Admitting: Internal Medicine

## 2009-12-20 ENCOUNTER — Encounter: Payer: Self-pay | Admitting: Internal Medicine

## 2010-01-01 ENCOUNTER — Encounter: Payer: Self-pay | Admitting: Internal Medicine

## 2010-01-03 ENCOUNTER — Ambulatory Visit: Payer: Self-pay | Admitting: Oncology

## 2010-01-04 LAB — CBC WITH DIFFERENTIAL (CANCER CENTER ONLY)
BASO%: 0.6 % (ref 0.0–2.0)
LYMPH#: 2.1 10*3/uL (ref 0.9–3.3)
MONO#: 0.7 10*3/uL (ref 0.1–0.9)
NEUT#: 3 10*3/uL (ref 1.5–6.5)
Platelets: 148 10*3/uL (ref 145–400)
RDW: 13.8 % (ref 10.5–14.6)
WBC: 6.2 10*3/uL (ref 3.9–10.0)

## 2010-01-04 LAB — PROTIME-INR (CHCC SATELLITE): Protime: 27.6 Seconds — ABNORMAL HIGH (ref 10.6–13.4)

## 2010-01-10 ENCOUNTER — Encounter: Payer: Self-pay | Admitting: Internal Medicine

## 2010-01-30 ENCOUNTER — Encounter: Payer: Self-pay | Admitting: Internal Medicine

## 2010-02-01 LAB — PROTIME-INR (CHCC SATELLITE)
INR: 2.5 (ref 2.0–3.5)
Protime: 30 Seconds — ABNORMAL HIGH (ref 10.6–13.4)

## 2010-02-01 LAB — CBC WITH DIFFERENTIAL (CANCER CENTER ONLY)
BASO%: 0.5 % (ref 0.0–2.0)
HCT: 32.6 % — ABNORMAL LOW (ref 34.8–46.6)
LYMPH#: 1.7 10*3/uL (ref 0.9–3.3)
MONO#: 0.7 10*3/uL (ref 0.1–0.9)
NEUT#: 3.3 10*3/uL (ref 1.5–6.5)
NEUT%: 53 % (ref 39.6–80.0)
Platelets: 130 10*3/uL — ABNORMAL LOW (ref 145–400)
RDW: 14 % (ref 10.5–14.6)
WBC: 6.3 10*3/uL (ref 3.9–10.0)

## 2010-02-11 ENCOUNTER — Ambulatory Visit: Payer: Self-pay | Admitting: Internal Medicine

## 2010-02-20 ENCOUNTER — Encounter: Payer: Self-pay | Admitting: Internal Medicine

## 2010-02-27 ENCOUNTER — Ambulatory Visit: Payer: Self-pay | Admitting: Oncology

## 2010-02-28 ENCOUNTER — Ambulatory Visit: Payer: Self-pay | Admitting: Diagnostic Radiology

## 2010-02-28 ENCOUNTER — Emergency Department (HOSPITAL_BASED_OUTPATIENT_CLINIC_OR_DEPARTMENT_OTHER): Admission: EM | Admit: 2010-02-28 | Discharge: 2010-03-01 | Payer: Self-pay | Admitting: Emergency Medicine

## 2010-03-05 LAB — CBC WITH DIFFERENTIAL (CANCER CENTER ONLY)
BASO%: 0.8 % (ref 0.0–2.0)
LYMPH#: 1.6 10*3/uL (ref 0.9–3.3)
MONO#: 0.5 10*3/uL (ref 0.1–0.9)
NEUT#: 2.2 10*3/uL (ref 1.5–6.5)
Platelets: 112 10*3/uL — ABNORMAL LOW (ref 145–400)
RDW: 13.4 % (ref 10.5–14.6)
WBC: 4.6 10*3/uL (ref 3.9–10.0)

## 2010-03-05 LAB — PROTIME-INR (CHCC SATELLITE)
INR: 2.4 (ref 2.0–3.5)
Protime: 28.8 Seconds — ABNORMAL HIGH (ref 10.6–13.4)

## 2010-03-14 ENCOUNTER — Ambulatory Visit: Payer: Self-pay | Admitting: Family Medicine

## 2010-03-14 DIAGNOSIS — L679 Hair color and hair shaft abnormality, unspecified: Secondary | ICD-10-CM | POA: Insufficient documentation

## 2010-03-14 DIAGNOSIS — M810 Age-related osteoporosis without current pathological fracture: Secondary | ICD-10-CM | POA: Insufficient documentation

## 2010-03-14 DIAGNOSIS — S42009A Fracture of unspecified part of unspecified clavicle, initial encounter for closed fracture: Secondary | ICD-10-CM | POA: Insufficient documentation

## 2010-03-15 ENCOUNTER — Encounter: Payer: Self-pay | Admitting: Internal Medicine

## 2010-03-15 ENCOUNTER — Encounter: Payer: Self-pay | Admitting: Family Medicine

## 2010-03-20 ENCOUNTER — Encounter: Payer: Self-pay | Admitting: Family Medicine

## 2010-04-01 ENCOUNTER — Ambulatory Visit: Payer: Self-pay | Admitting: Oncology

## 2010-04-01 LAB — CBC WITH DIFFERENTIAL/PLATELET
BASO%: 0.6 % (ref 0.0–2.0)
LYMPH%: 21.8 % (ref 14.0–49.7)
MCHC: 31.9 g/dL (ref 31.5–36.0)
MCV: 91.2 fL (ref 79.5–101.0)
MONO%: 10.9 % (ref 0.0–14.0)
NEUT#: 4 10*3/uL (ref 1.5–6.5)
Platelets: 145 10*3/uL (ref 145–400)
RBC: 3.64 10*6/uL — ABNORMAL LOW (ref 3.70–5.45)
RDW: 15.4 % — ABNORMAL HIGH (ref 11.2–14.5)
WBC: 7.2 10*3/uL (ref 3.9–10.3)
nRBC: 0 % (ref 0–0)

## 2010-04-01 LAB — PROTIME-INR
INR: 2.9 (ref 2.00–3.50)
Protime: 34.8 Seconds — ABNORMAL HIGH (ref 10.6–13.4)

## 2010-04-02 ENCOUNTER — Telehealth: Payer: Self-pay | Admitting: Family Medicine

## 2010-04-02 ENCOUNTER — Ambulatory Visit (HOSPITAL_COMMUNITY)
Admission: RE | Admit: 2010-04-02 | Discharge: 2010-04-02 | Payer: Self-pay | Source: Home / Self Care | Admitting: Family Medicine

## 2010-04-09 ENCOUNTER — Encounter: Payer: Self-pay | Admitting: Family Medicine

## 2010-04-09 ENCOUNTER — Encounter: Payer: Self-pay | Admitting: Internal Medicine

## 2010-04-10 ENCOUNTER — Encounter: Payer: Self-pay | Admitting: Internal Medicine

## 2010-04-10 ENCOUNTER — Encounter (INDEPENDENT_AMBULATORY_CARE_PROVIDER_SITE_OTHER): Payer: Self-pay | Admitting: *Deleted

## 2010-04-10 LAB — CONVERTED CEMR LAB
AST: 21 units/L
CO2, serum: 21 mmol/L
Calcium: 7.5 mg/dL
HCT: 31.7 %
MCV: 90.1 fL
Magnesium: 1.9 mg/dL
Platelets: 163 10*3/uL
Sodium, serum: 134 mmol/L
Total Protein: 7.7 g/dL

## 2010-04-12 ENCOUNTER — Ambulatory Visit: Payer: Self-pay | Admitting: Family Medicine

## 2010-04-12 DIAGNOSIS — R109 Unspecified abdominal pain: Secondary | ICD-10-CM | POA: Insufficient documentation

## 2010-04-12 LAB — CONVERTED CEMR LAB
Bilirubin Urine: NEGATIVE
Glucose, Urine, Semiquant: NEGATIVE
Ketones, urine, test strip: NEGATIVE
WBC Urine, dipstick: NEGATIVE

## 2010-04-15 ENCOUNTER — Telehealth (INDEPENDENT_AMBULATORY_CARE_PROVIDER_SITE_OTHER): Payer: Self-pay | Admitting: *Deleted

## 2010-04-15 LAB — CONVERTED CEMR LAB
BUN: 20 mg/dL (ref 6–23)
Basophils Relative: 0.9 % (ref 0.0–3.0)
Eosinophils Relative: 11.2 % — ABNORMAL HIGH (ref 0.0–5.0)
GFR calc non Af Amer: 178.22 mL/min (ref >60)
Glucose, Bld: 89 mg/dL (ref 70–99)
HCT: 33.6 % — ABNORMAL LOW (ref 36.0–46.0)
Hemoglobin: 10.9 g/dL — ABNORMAL LOW (ref 12.0–15.0)
MCV: 91.8 fL (ref 78.0–100.0)
Monocytes Absolute: 0.3 10*3/uL (ref 0.1–1.0)
Neutro Abs: 3.5 10*3/uL (ref 1.4–7.7)
Neutrophils Relative %: 59.1 % (ref 43.0–77.0)
Potassium: 4.4 meq/L (ref 3.5–5.1)
RBC: 3.66 M/uL — ABNORMAL LOW (ref 3.87–5.11)
WBC: 5.9 10*3/uL (ref 4.5–10.5)

## 2010-04-17 ENCOUNTER — Encounter (INDEPENDENT_AMBULATORY_CARE_PROVIDER_SITE_OTHER): Payer: Self-pay | Admitting: *Deleted

## 2010-04-22 ENCOUNTER — Encounter: Payer: Self-pay | Admitting: Internal Medicine

## 2010-04-22 ENCOUNTER — Encounter (INDEPENDENT_AMBULATORY_CARE_PROVIDER_SITE_OTHER): Payer: Self-pay | Admitting: *Deleted

## 2010-04-22 LAB — CONVERTED CEMR LAB
AST: 23 units/L
BUN: 11 mg/dL
CO2, serum: 23 mmol/L
Calcium: 7.7 mg/dL
Glucose, Bld: 77 mg/dL
HCT: 26.4 %
Hemoglobin: 8.8 g/dL
Sodium, serum: 130 mmol/L
Total Bilirubin: 0.3 mg/dL
WBC, blood: 8.5 10*3/uL

## 2010-04-25 ENCOUNTER — Ambulatory Visit (HOSPITAL_BASED_OUTPATIENT_CLINIC_OR_DEPARTMENT_OTHER)
Admission: RE | Admit: 2010-04-25 | Discharge: 2010-04-25 | Payer: Self-pay | Source: Home / Self Care | Attending: Family Medicine | Admitting: Family Medicine

## 2010-04-25 ENCOUNTER — Telehealth (INDEPENDENT_AMBULATORY_CARE_PROVIDER_SITE_OTHER): Payer: Self-pay | Admitting: *Deleted

## 2010-04-25 ENCOUNTER — Ambulatory Visit: Payer: Self-pay | Admitting: Family Medicine

## 2010-04-26 ENCOUNTER — Encounter (INDEPENDENT_AMBULATORY_CARE_PROVIDER_SITE_OTHER): Payer: Self-pay | Admitting: *Deleted

## 2010-04-26 ENCOUNTER — Encounter: Payer: Self-pay | Admitting: Family Medicine

## 2010-04-26 LAB — CBC WITH DIFFERENTIAL/PLATELET
Eosinophils Absolute: 0.4 10*3/uL (ref 0.0–0.5)
HGB: 9 g/dL — ABNORMAL LOW (ref 11.6–15.9)
MONO#: 0.7 10*3/uL (ref 0.1–0.9)
NEUT#: 2.6 10*3/uL (ref 1.5–6.5)
RBC: 3.17 10*6/uL — ABNORMAL LOW (ref 3.70–5.45)
RDW: 14.6 % — ABNORMAL HIGH (ref 11.2–14.5)
WBC: 5.3 10*3/uL (ref 3.9–10.3)
lymph#: 1.5 10*3/uL (ref 0.9–3.3)
nRBC: 0 % (ref 0–0)

## 2010-04-26 LAB — PROTIME-INR
INR: 3.4 (ref 2.00–3.50)
Protime: 40.8 Seconds — ABNORMAL HIGH (ref 10.6–13.4)

## 2010-04-29 ENCOUNTER — Encounter: Payer: Self-pay | Admitting: Internal Medicine

## 2010-05-01 ENCOUNTER — Ambulatory Visit: Payer: Self-pay | Admitting: Oncology

## 2010-05-01 LAB — PROTIME-INR
INR: 2.1 (ref 2.00–3.50)
Protime: 25.2 Seconds — ABNORMAL HIGH (ref 10.6–13.4)

## 2010-05-07 ENCOUNTER — Encounter: Payer: Self-pay | Admitting: Family Medicine

## 2010-05-07 ENCOUNTER — Encounter (INDEPENDENT_AMBULATORY_CARE_PROVIDER_SITE_OTHER): Payer: Self-pay | Admitting: *Deleted

## 2010-05-07 LAB — CONVERTED CEMR LAB
ALT: 47 units/L
AST: 41 units/L
Alkaline Phosphatase: 182 units/L
BUN: 19 mg/dL
CO2, serum: 19 mmol/L
Calcium: 8 mg/dL
HCT: 34 %
Sodium, serum: 136 mmol/L
Total Bilirubin: 0.3 mg/dL
WBC, blood: 6.5 10*3/uL

## 2010-05-10 ENCOUNTER — Ambulatory Visit
Admission: RE | Admit: 2010-05-10 | Discharge: 2010-05-10 | Payer: Self-pay | Source: Home / Self Care | Attending: Family Medicine | Admitting: Family Medicine

## 2010-05-10 ENCOUNTER — Encounter (INDEPENDENT_AMBULATORY_CARE_PROVIDER_SITE_OTHER): Payer: Self-pay | Admitting: *Deleted

## 2010-05-13 ENCOUNTER — Encounter: Payer: Self-pay | Admitting: Internal Medicine

## 2010-05-24 LAB — CBC WITH DIFFERENTIAL/PLATELET
BASO%: 0.6 % (ref 0.0–2.0)
Basophils Absolute: 0 10*3/uL (ref 0.0–0.1)
EOS%: 9.7 % — ABNORMAL HIGH (ref 0.0–7.0)
Eosinophils Absolute: 0.5 10*3/uL (ref 0.0–0.5)
HCT: 30.6 % — ABNORMAL LOW (ref 34.8–46.6)
HGB: 9.9 g/dL — ABNORMAL LOW (ref 11.6–15.9)
LYMPH%: 31.2 % (ref 14.0–49.7)
MCH: 28.9 pg (ref 25.1–34.0)
MCHC: 32.4 g/dL (ref 31.5–36.0)
MCV: 89.2 fL (ref 79.5–101.0)
MONO#: 0.6 10*3/uL (ref 0.1–0.9)
MONO%: 11.4 % (ref 0.0–14.0)
NEUT#: 2.4 10*3/uL (ref 1.5–6.5)
NEUT%: 47.1 % (ref 38.4–76.8)
Platelets: 161 10*3/uL (ref 145–400)
RBC: 3.43 10*6/uL — ABNORMAL LOW (ref 3.70–5.45)
RDW: 15.2 % — ABNORMAL HIGH (ref 11.2–14.5)
WBC: 5.1 10*3/uL (ref 3.9–10.3)
lymph#: 1.6 10*3/uL (ref 0.9–3.3)
nRBC: 0 % (ref 0–0)

## 2010-05-24 LAB — PROTIME-INR
INR: 2.5 (ref 2.00–3.50)
Protime: 30 Seconds — ABNORMAL HIGH (ref 10.6–13.4)

## 2010-06-02 ENCOUNTER — Encounter: Payer: Self-pay | Admitting: Internal Medicine

## 2010-06-02 ENCOUNTER — Encounter: Payer: Self-pay | Admitting: Family Medicine

## 2010-06-06 ENCOUNTER — Encounter: Payer: Self-pay | Admitting: Internal Medicine

## 2010-06-13 NOTE — Assessment & Plan Note (Signed)
Summary: followup on pnemonia/kn   Vital Signs:  Patient profile:   60 year old female Weight:      144 pounds BMI:     22.14 O2 Sat:      100 % on Room air Temp:     98.4 degrees F oral Pulse rate:   78 / minute BP sitting:   118 / 74  (left arm)  Vitals Entered By: Doristine Devoid CMA (May 10, 2010 10:54 AM)  O2 Flow:  Room air CC: f/u on pneumonia feels better still w/ some coughing    History of Present Illness: 60 yo woman here today to f/u PNA.  dx'd 12/15 and completed 10 days of IV avelox.  still having persistant cough- 'not nearly what it was'.  no longer having fevers or chills.  ribs still hurt from coughing.  has vicodin at home to use.  Current Medications (verified): 1)  Vitamin D 16109 Unit  Caps (Ergocalciferol) .... Take 1 By Mouth Alternating 1-2 Capsules 2)  Omeprazole 40 Mg Cpdr (Omeprazole) .Marland Kitchen.. 1 By Mouth Two Times A Day 3)  Xifaxan 550 Mg Tabs (Rifaximin) .Marland Kitchen.. 1 By Mouth Two Times A Day X 7 Days Alternating With Cipro As Directed 4)  Coumadin 10 Mg Tabs (Warfarin Sodium) .... As Directed 5)  Promethazine Hcl 25 Mg Tabs (Promethazine Hcl) .... As Needed 6)  Benadryl 25 Mg Caps (Diphenhydramine Hcl) .... As Needed 7)  Imodium A-D 2 Mg Tabs (Loperamide Hcl) .... Two Tablets Before Each Meal Daily 8)  Tums 500 Mg Chew (Calcium Carbonate Antacid) .... Three Tablets Every Day 9)  Actigall 300 Mg Caps (Ursodiol) .... Take 1 Tablet By Mouth Two Times A Day 10)  Clobetasol Propionate 0.05 % Oint (Clobetasol Propionate) .... As Directed 11)  Tpn Electrolytes Ftv  Soln (Parenteral Electrolytes) .... Daily For 10 Hours 12)  Ciprofloxacin Hcl 500 Mg  Tabs (Ciprofloxacin Hcl) .... Take 1 Tablet By Mouth Two Times A Day X5 Days 13)  Hydrocodone-Acetaminophen 5-325 Mg Tabs (Hydrocodone-Acetaminophen) .Marland Kitchen.. 1-2 Tablets Every 6 Hours As Needed 14)  Ibuprofen 400 Mg Tabs (Ibuprofen) .... Take 1-2 Tablets Every 6 Hours As Needed 15)  Diazepam 5 Mg Tabs (Diazepam) .... Take  One Tablet Every 6 Hours As Needed 16)  Vaniqa 13.9 % Crea (Eflornithine Hcl) .... Apply To Affected Area Two Times A Day. 17)  Picc Line .... Flush With Heparin After Procedure  Allergies (verified): 1)  ! Pcn 2)  ! * Ivp Dye  Review of Systems      See HPI  Physical Exam  General:  Well-developed,well-nourished,in no acute distress; alert,appropriate and cooperative throughout examination Head:  NCAT, no TTP over sinuses Neck:  No deformities, masses, or tenderness noted. Lungs:  Normal respiratory effort, chest expands symmetrically. Lungs are clear to auscultation, no crackles or wheezes. Heart:  Regular rate and rhythm; no murmurs, rubs,  or bruits.   Impression & Recommendations:  Problem # 1:  COUGH (ICD-786.2) Assessment Improved has completed 10 day course of IV Avelox for PNA.  feeling much better.  still w/ some persistant cough.  assured her this was to be expected.  reviewed supportive care and red flags that should prompt return.  Pt expresses understanding and is in agreement w/ this plan.  Complete Medication List: 1)  Vitamin D 60454 Unit Caps (Ergocalciferol) .... Take 1 by mouth alternating 1-2 capsules 2)  Omeprazole 40 Mg Cpdr (Omeprazole) .Marland Kitchen.. 1 by mouth two times a day 3)  Xifaxan  550 Mg Tabs (Rifaximin) .Marland Kitchen.. 1 by mouth two times a day x 7 days alternating with cipro as directed 4)  Coumadin 10 Mg Tabs (Warfarin sodium) .... As directed 5)  Promethazine Hcl 25 Mg Tabs (Promethazine hcl) .... As needed 6)  Benadryl 25 Mg Caps (Diphenhydramine hcl) .... As needed 7)  Imodium A-d 2 Mg Tabs (Loperamide hcl) .... Two tablets before each meal daily 8)  Tums 500 Mg Chew (Calcium carbonate antacid) .... Three tablets every day 9)  Actigall 300 Mg Caps (Ursodiol) .... Take 1 tablet by mouth two times a day 10)  Clobetasol Propionate 0.05 % Oint (Clobetasol propionate) .... As directed 11)  Tpn Electrolytes Ftv Soln (Parenteral electrolytes) .... Daily for 10  hours 12)  Ciprofloxacin Hcl 500 Mg Tabs (Ciprofloxacin hcl) .... Take 1 tablet by mouth two times a day x5 days 13)  Hydrocodone-acetaminophen 5-325 Mg Tabs (Hydrocodone-acetaminophen) .Marland Kitchen.. 1-2 tablets every 6 hours as needed 14)  Ibuprofen 400 Mg Tabs (Ibuprofen) .... Take 1-2 tablets every 6 hours as needed 15)  Diazepam 5 Mg Tabs (Diazepam) .... Take one tablet every 6 hours as needed 16)  Vaniqa 13.9 % Crea (Eflornithine hcl) .... Apply to affected area two times a day. 17)  Picc Line  .... Flush with heparin after procedure  Patient Instructions: 1)  You look great! 2)  I'm so glad you're feeling better 3)  Continue Mucinex as needed for cough 4)  If you feel you need additional cough meds- call me 5)  Call with any questions or concerns 6)  Happy New Year!   Orders Added: 1)  Est. Patient Level III [16109]

## 2010-06-13 NOTE — Progress Notes (Signed)
Summary: picc line  Phone Note From Other Clinic   Summary of Call: PT current at short stay for reclast. Per staff Pt has pic line and want ok to use it for reclast infusion and if ok will need order faxed stating:flush with heparin per protocol after procedure. FAX [phone]........Marland KitchenFelecia Deloach CMA  April 02, 2010 11:16 AM   Follow-up for Phone Call        per dr Beverely Low ok to use picc line. Verbal ok given and order faxed..............Marland KitchenFelecia Deloach CMA  April 02, 2010 11:25 AM     New/Updated Medications: * PICC LINE Flush with heparin after procedure Prescriptions: PICC LINE Flush with heparin after procedure  #1 x 0   Entered by:   Jeremy Johann CMA   Authorized by:   [name] MD   Signed by:   Jeremy Johann CMA on 04/02/2010   Method used:   Printed then faxed to ...       [facility] #[id number]* (retail)       [address]       [place], [place]  [address]       Ph: [phone]       Fax: [phone]   RxID:   [id number]

## 2010-06-13 NOTE — Miscellaneous (Signed)
Summary: Orders/[facility]  Orders/[facility]   Imported By: Lester Walker 01/04/2010 09:20:53  _____________________________________________________________________  External Attachment:    Type:   Image     Comment:   External Document

## 2010-06-13 NOTE — Progress Notes (Signed)
Summary: diazepam refill   Phone Note Refill Request Call back at Home Phone [phone] Message from:  Patient on October 26, 2009 10:00 AM  Refills Requested: Medication #1:  DIAZEPAM 5 MG TABS take one tablet every 6 hours as needed. patient is in physical therepy - wants refill - theripist said it would be a good idea - [facility]  Initial call taken by: Okey Regal [place],  October 26, 2009 10:02 AM  Follow-up for Phone Call        last filled 10/11/09 and last office visit 10/11/09........Marland KitchenDoristine Devoid  October 26, 2009 10:59 AM   Additional Follow-up for Phone Call Additional follow up Details #1::        ok for #45, no refills.  only take as needed Additional Follow-up by: [name] MD,  October 26, 2009 11:10 AM    Additional Follow-up for Phone Call Additional follow up Details #2::    patient aware prescription sent to pharmacy ........Marland KitchenDoristine Devoid  October 26, 2009 11:14 AM    Appended Document: diazepam refill      Clinical Lists Changes  Medications: Rx of DIAZEPAM 5 MG TABS (DIAZEPAM) take one tablet every 6 hours as needed;  #45 x 0;  Signed;  Entered by: Doristine Devoid;  Authorized by: [name] MD;  Method used: Printed then faxed to [facility] #[id number]*, [address] [place], [place], [place]  [address], Ph: [phone], Fax: [phone]    Prescriptions: DIAZEPAM 5 MG TABS (DIAZEPAM) take one tablet every 6 hours as needed  #45 x 0   Entered by:   Doristine Devoid   Authorized by:   [name] MD   Signed by:   Doristine Devoid on 10/26/2009   Method used:   Printed then faxed to ...       [facility] #[id number]* (retail)       [address]       [place], [place]  [address]       Ph: [phone]       Fax: [phone]   RxID:   [id number]

## 2010-06-13 NOTE — Assessment & Plan Note (Signed)
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 She also has bacterial overgrowth, abnormal LFT's with ? of cirrhosis. She wants PICC changed. She had an Aranesp injection felt rotten with chills and night sweats. PICC site looks ok. Arm is sore. Problems x 2 weeks. Xifaxan and Cipro working still, to control diarrhea. She is still awaiting dental clinic eligibility for teeth removal.  She thinks acute problems of fatigue, achiness and "blah" seem similar to prior PICC problems. No fever or site issues, though. She has been concerned abut not being able to do self care with more permanant lines  She cuts 3 acres of land on riding mower Is having terrible fatigue and energy loss especially in last couple weeks. She took ibuprofen x 2 twice.   GI Review of Systems    Reports bloating, nausea, and  vomiting.      Denies abdominal pain, acid reflux, belching, chest pain, dysphagia with liquids, dysphagia with solids, heartburn, loss of appetite, vomiting blood, weight loss, and  weight gain.        Denies anal fissure, black tarry stools, change in bowel habit, constipation, diarrhea, diverticulosis, fecal incontinence, heme positive stool, hemorrhoids, irritable bowel syndrome, jaundice, light color stool, liver problems, rectal bleeding, and  rectal pain.    Current Medications (verified): 1)  Vitamin D 16109 Unit  Caps (Ergocalciferol) .... Take 1 By Mouth Alternating 1-2 Capsules 2)  Omeprazole 40 Mg Cpdr (Omeprazole) .Marland Kitchen.. 1 By Mouth Two Times A Day 3)  Xifaxan 550 Mg Tabs (Rifaximin) .Marland Kitchen.. 1 By Mouth Two Times A Day X 7 Days Alternating With Cipro As Directed 4)  Coumadin 10 Mg Tabs (Warfarin Sodium) ....  As Directed 5)  Promethazine Hcl 25 Mg Tabs (Promethazine Hcl) .... As Needed 6)  Benadryl 25 Mg Caps (Diphenhydramine Hcl) .... As Needed 7)  Imodium A-D 2 Mg Tabs (Loperamide Hcl) .... Two Tablets Before Each Meal Daily 8)  Tums 500 Mg Chew (Calcium Carbonate Antacid) .... Three Tablets Every Day 9)  Actigall 300 Mg Caps (Ursodiol) .... Take 1 Tablet By Mouth Two Times A Day 10)  Clobetasol Propionate 0.05 % Oint (Clobetasol Propionate) .... As Directed 11)  Tpn Electrolytes Ftv  Soln (Parenteral Electrolytes) .... Daily For 10 Hours 12)  Ciprofloxacin Hcl 500 Mg  Tabs (Ciprofloxacin Hcl) .... Take 1/2 Tablet By Mouth Two Times A Day As Directed  Allergies (verified): 1)  ! Pcn 2)  ! * Ivp Dye  Past History:  Past Medical History: Reviewed history from 01/19/2008 and no changes required. 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  On chronic TPN. Bacterial overgrowth problems. Abnormal LFTs. Vitamin D deficiency. History of colon polyps; colonoscopy December 05, 2005, negative for     recurrent polyps. Perpiherally inserted central catheter line in place, left upper     extremity. Thrombophilia, unclear etiology.  On chronic Coumadin managed by Dr.     Leafy Ro. History of diabetes mellitus, resolved after weight loss. Prior obesity. Allergic rhinosinusitis. Osteopenia. Dental problems.   Past Surgical History: Reviewed history from 01/19/2008 and no changes required. multiple small bowel resection Cholecystectomy Hip fracture surgery 11/08  Family History: Reviewed history from 11/20/2008 and no changes required. No  FH of Colon Cancer: Family History of Diabetes: mother Breast Cancer- no CAD- no HTN- mother  Social History: Reviewed history from 01/19/2008 and no changes required. Patient is a former smoker.  Alcohol Use - no Daily Caffeine Use  coke colas daily Illicit Drug Use - no Patient does not get  regular exercise.   Vital Signs:  Patient profile:   60 year old female Height:      67.75 inches Weight:      148.38 pounds BMI:     22.81 Pulse rate:   68 / minute Pulse rhythm:   regular BP sitting:   104 / 60  (right arm) Cuff size:   regular  Vitals Entered By: June McMurray CMA Duncan Dull) (Oct 01, 2009 10:41 AM)  Physical Exam  General:  mildly chronically ill Eyes:  anicteric Lungs:  Clear throughout to auscultation. Heart:  Regular rate and rhythm; no murmurs, rubs,  or bruits. Abdomen:  multiple scars, soft, NT/ND, +BS subcutaneous firm tissue in RLQ as before Extremities:  no C/C/E left arm has PiCC intact, no cords, swelling, erythema   Impression & Recommendations:  Problem # 1:  MALAISE AND FATIGUE (ICD-780.79) Assessment New etiology unclear ? 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 Gwenith Spitz PICC is 60 years old does not seem infected but seems prudent to change it and will ask IR to do so we again discussed a longer-term vascular access - she has been concerbned about self care issues  I told her I thought she could do self-care with something like a Hickman but would not do Port-o-cath will see about repeat appointment with general surgery Orders: GI Misc Procedure/ Radiology Order (GI Misc )  Problem # 4:  TRANSAMINASES, SERUM,  ELEVATED (ICD-790.4) Assessment: Unchanged They continue to fluctuate -m 1-2x normal range she is also followed by [facility] Liver clinic is at risk of cirrhosis but clinically without that at this time imaging (MR at [facility] has suggested cirrhosis)  Problem # 5:  ? of CIRRHOSIS OF LIVER WITHOUT MENTION OF ALCOHOL (ICD-571.5) Assessment: Unchanged she is also followed by [facility] Liver clinic is at risk of cirrhosis but clinically without that at this time imaging (MR at [facility] has suggested cirrhosis) Dr. Julieta Gutting did not recommend bx she does not have varices, ascites, enscephalopathy she has been vaccinated for HAV and HBV with Twinvrix at 0, 7 and 30 d. she may need a 12 month dose and will check (01/2010 would be due).  Patient Instructions: 1)  Please go to the basement to have your lab tests drawn today.  2)  You are scheduled to have your PICC line exchanged on Wednesday, 10/03/09 @ [facility]. 3)  Please continue current medications.  4)  If you have any fever, redness or discharge around PICC line then you should go to the ER. 5)  Copy sent to : Lezlie Octave, MD,  Drue Second, MD 6)  The medication list  was reviewed and reconciled.  All changed / newly prescribed medications were explained.  A complete medication list was provided to the patient / caregiver.  cc also Drs. Britt Bolognese and Jason Coop at [facility]

## 2010-06-13 NOTE — Miscellaneous (Signed)
Summary: Care Plans/[facility]  Care Plans/[facility]   Imported By: Sherian Rein 03/01/2010 07:37:48  _____________________________________________________________________  External Attachment:    Type:   Image     Comment:   External Document

## 2010-06-13 NOTE — Miscellaneous (Signed)
   Clinical Lists Changes  Observations: Added new observation of BILI TOTAL: 0.3 mg/dL (54/01/8118 14:78) Added new observation of ALK PHOS: 142 units/L (04/22/2010 11:35) Added new observation of SGPT (ALT): 21 units/L (04/22/2010 11:35) Added new observation of SGOT (AST): 23 units/L (04/22/2010 11:35) Added new observation of PROTEIN, TOT: 7.3 g/dL (29/56/2130 86:57) Added new observation of ALBUMIN: 2.2 g/dL (84/69/6295 28:41) Added new observation of MAGNESIUM: 1.8 mg/dL (32/44/0102 72:53) Added new observation of CALCIUM: 7.7 mg/dL (66/44/0347 42:59) Added new observation of GLUCOSE SER: 77 mg/dL (56/38/7564 33:29) Added new observation of CREATININE: 0.48 mg/dL (51/88/4166 06:30) Added new observation of BUN: 11 mg/dL (16/05/930 35:57) Added new observation of CO2 TOTAL: 23 mmol/L (04/22/2010 11:35) Added new observation of CHLORIDE: 99 mmol/L (04/22/2010 11:35) Added new observation of POTASSIUM: 3.5 mmol/L (04/22/2010 11:35) Added new observation of SODIUM: 130 mmol/L (04/22/2010 11:35) Added new observation of PLATELETS: 156 10*3/mm3 (04/22/2010 11:35) Added new observation of MCH: 29.6 pg (04/22/2010 11:35) Added new observation of MCV: 88.9 fL (04/22/2010 11:35) Added new observation of HCT: 26.4 % (04/22/2010 11:35) Added new observation of HGB: 8.8 g/dL (32/20/2542 70:62) Added new observation of RBC: 2.97 10*6/mm3 (04/22/2010 11:35) Added new observation of WBC: 8.5 10*3/mm3 (04/22/2010 11:35)

## 2010-06-13 NOTE — Letter (Signed)
[facility]   Imported By: Lester [place] 09/28/2009 12:22:32  _____________________________________________________________________  External Attachment:    Type:   Image     Comment:   External Document

## 2010-06-13 NOTE — Op Note (Signed)
Summary: [facility]  [facility]   Imported By: Lanelle Bal 04/02/2010 09:52:41  _____________________________________________________________________  External Attachment:    Type:   Image     Comment:   External Document

## 2010-06-13 NOTE — Assessment & Plan Note (Signed)
Summary: FOR NECK PAIN==PH   Vital Signs:  Patient profile:   60 year old female Weight:      153 pounds Temp:     98.1 degrees F oral BP sitting:   112 / 70  (left arm)  Vitals Entered By: Doristine Devoid (October 11, 2009 2:26 PM) CC: neck pain x2 weeks muscle tight    History of Present Illness: 60 yo woman here today for neck pain x2 weeks.  started after sleeping in the recliner.  for 1st week alternated heat and cold and use tylenol for pain.  on Saturday 'it was killing me'- dx'd w/ L sided trapezius spasm at [facility] ER.  tx'd w/ Hydrocodone, Ibuprofen, Diazepam w/ some relief.  now spasm seems to have moved from neck to on top of shoulder.  reports 'my shoulders are different heights'.  at recent PT check her INR was 4 b/c of the ibuprofen.  Current Medications (verified): 1)  Vitamin D 16109 Unit  Caps (Ergocalciferol) .... Take 1 By Mouth Alternating 1-2 Capsules 2)  Omeprazole 40 Mg Cpdr (Omeprazole) .Marland Kitchen.. 1 By Mouth Two Times A Day 3)  Xifaxan 550 Mg Tabs (Rifaximin) .Marland Kitchen.. 1 By Mouth Two Times A Day X 7 Days Alternating With Cipro As Directed 4)  Coumadin 10 Mg Tabs (Warfarin Sodium) .... As Directed 5)  Promethazine Hcl 25 Mg Tabs (Promethazine Hcl) .... As Needed 6)  Benadryl 25 Mg Caps (Diphenhydramine Hcl) .... As Needed 7)  Imodium A-D 2 Mg Tabs (Loperamide Hcl) .... Two Tablets Before Each Meal Daily 8)  Tums 500 Mg Chew (Calcium Carbonate Antacid) .... Three Tablets Every Day 9)  Actigall 300 Mg Caps (Ursodiol) .... Take 1 Tablet By Mouth Two Times A Day 10)  Clobetasol Propionate 0.05 % Oint (Clobetasol Propionate) .... As Directed 11)  Tpn Electrolytes Ftv  Soln (Parenteral Electrolytes) .... Daily For 10 Hours 12)  Ciprofloxacin Hcl 500 Mg  Tabs (Ciprofloxacin Hcl) .... Take 1/2 Tablet By Mouth Two Times A Day As Directed 13)  Hydrocodone-Acetaminophen 5-325 Mg Tabs (Hydrocodone-Acetaminophen) .Marland Kitchen.. 1-2 Tablets Every 6 Hours As Needed 14)  Ibuprofen 400 Mg Tabs (Ibuprofen)  .... Take 1-2 Tablets Every 6 Hours As Needed 15)  Diazepam 5 Mg Tabs (Diazepam) .... Take One Tablet Every 6 Hours As Needed  Allergies (verified): 1)  ! Pcn 2)  ! * Ivp Dye  Review of Systems      See HPI  Physical Exam  General:  Well-developed,well-nourished,in no acute distress; alert,appropriate and cooperative throughout examination Neck:  pain w/ forward flexion > extension Msk:  + trapezius spasm on L full ROM of L shoulder Pulses:  +2 carotid, radial   Impression & Recommendations:  Problem # 1:  MUSCLE SPASM, TRAPEZIUS MUSCLE, LEFT (ICD-728.85) Assessment New pt's pain still due to trapezius spasm.  pt reports sxs initially improved w/ ibuprofen but this altered her INR.  given need for ongoing coumadin advised pt to stop ibuprofen, continue the hydrocodone and diazepam.  will refer to PT for possible [place] therapy, TENS, stim, etc.  no red flags on hx or PE but pt obviously very comfortable.  reviewed supportive care and red flags that should prompt return.  Pt expresses understanding and is in agreement w/ this plan. Orders: Physical Therapy Referral (PT)  Complete Medication List: 1)  Vitamin D 60454 Unit Caps (Ergocalciferol) .... Take 1 by mouth alternating 1-2 capsules 2)  Omeprazole 40 Mg Cpdr (Omeprazole) .Marland Kitchen.. 1 by mouth two times a day  3)  Xifaxan 550 Mg Tabs (Rifaximin) .Marland Kitchen.. 1 by mouth two times a day x 7 days alternating with cipro as directed 4)  Coumadin 10 Mg Tabs (Warfarin sodium) .... As directed 5)  Promethazine Hcl 25 Mg Tabs (Promethazine hcl) .... As needed 6)  Benadryl 25 Mg Caps (Diphenhydramine hcl) .... As needed 7)  Imodium A-d 2 Mg Tabs (Loperamide hcl) .... Two tablets before each meal daily 8)  Tums 500 Mg Chew (Calcium carbonate antacid) .... Three tablets every day 9)  Actigall 300 Mg Caps (Ursodiol) .... Take 1 tablet by mouth two times a day 10)  Clobetasol Propionate 0.05 % Oint (Clobetasol propionate) .... As directed 11)  Tpn  Electrolytes Ftv Soln (Parenteral electrolytes) .... Daily for 10 hours 12)  Ciprofloxacin Hcl 500 Mg Tabs (Ciprofloxacin hcl) .... Take 1/2 tablet by mouth two times a day as directed 13)  Hydrocodone-acetaminophen 5-325 Mg Tabs (Hydrocodone-acetaminophen) .Marland Kitchen.. 1-2 tablets every 6 hours as needed 14)  Ibuprofen 400 Mg Tabs (Ibuprofen) .... Take 1-2 tablets every 6 hours as needed 15)  Diazepam 5 Mg Tabs (Diazepam) .... Take one tablet every 6 hours as needed  Patient Instructions: 1)  Follow up as needed with me 2)  Call if your symptoms worsen or aren't improving 3)  Someone will call you with your physical therapy appt 4)  Take the Hydrocodone as directed for pain 5)  Continue the Diazepam for spasm 6)  Use the heating pad 7)  HANG IN THERE!!! Prescriptions: DIAZEPAM 5 MG TABS (DIAZEPAM) take one tablet every 6 hours as needed  #45 x 0   Entered and Authorized by:   [name] MD   Signed by:   [name] MD on 10/11/2009   Method used:   Print then Give to Patient   RxID:   [id number] HYDROCODONE-ACETAMINOPHEN 5-325 MG TABS (HYDROCODONE-ACETAMINOPHEN) 1-2 tablets every 6 hours as needed  #60 x 0   Entered and Authorized by:   [name] MD   Signed by:   [name] MD on 10/11/2009   Method used:   Print then Give to Patient   RxID:   [id number]

## 2010-06-13 NOTE — Miscellaneous (Signed)
Summary: Plan of Care / [facility]  Plan of Care / [facility]   Imported By: Lennie Odor 12/25/2009 10:21:27  _____________________________________________________________________  External Attachment:    Type:   Image     Comment:   External Document

## 2010-06-13 NOTE — Assessment & Plan Note (Signed)
Summary: [place]oy Register #4//SP  Nurse Visit   Allergies: 1)  ! Pcn 2)  ! * Ivp Dye  Immunizations Administered:  TwinRix # 4:    Vaccine Type: TwinRix    Site: right deltoid    Mfr: GlaxoSmithKline    Dose: 1.0 ml    Route: IM    Given by: Chales Abrahams CMA (AAMA)    Exp. Date: 03/14/2012    Lot #: [id number]    VIS given: 01/28/07 version given February 11, 2010.  Orders Added: 1)  TwinRix 1ml ( Hep A&B Adult dose) [90636] 2)  Admin 1st Vaccine [90471]

## 2010-06-13 NOTE — Assessment & Plan Note (Signed)
Summary: backache/cbs   Vital Signs:  Patient profile:   60 year old female Weight:      148 pounds Pulse rate:   86 / minute BP sitting:   114 / 70  (left arm)  Vitals Entered By: Doristine Devoid CMA (April 12, 2010 10:04 AM) CC: R side flank pain xtues. and feel full sx started w/ fever has some cipro she started taking on tues also    History of Present Illness: 60 yo woman here today for R flank pain.  started feeling 'yucky' over the weekend w/ low grade temps and then Sunday developed R flank pain.  pt reports it's not muscular but 'i can put my finger right on it'.  also noted that stomach felt bloated.  home health nurse came to house for blood draw on Tuesday and pt told her about her sxs- recommended she start her Cipro.  no longer having fevers.  has taken Azo, tylenol, hydrocodone w/out relief.  + dysuria, hesitancy.  no frequency.  no hx of kidney stones but told this could be possible due to TPN  Current Medications (verified): 1)  Vitamin D 16109 Unit  Caps (Ergocalciferol) .... Take 1 By Mouth Alternating 1-2 Capsules 2)  Omeprazole 40 Mg Cpdr (Omeprazole) .Marland Kitchen.. 1 By Mouth Two Times A Day 3)  Xifaxan 550 Mg Tabs (Rifaximin) .Marland Kitchen.. 1 By Mouth Two Times A Day X 7 Days Alternating With Cipro As Directed 4)  Coumadin 10 Mg Tabs (Warfarin Sodium) .... As Directed 5)  Promethazine Hcl 25 Mg Tabs (Promethazine Hcl) .... As Needed 6)  Benadryl 25 Mg Caps (Diphenhydramine Hcl) .... As Needed 7)  Imodium A-D 2 Mg Tabs (Loperamide Hcl) .... Two Tablets Before Each Meal Daily 8)  Tums 500 Mg Chew (Calcium Carbonate Antacid) .... Three Tablets Every Day 9)  Actigall 300 Mg Caps (Ursodiol) .... Take 1 Tablet By Mouth Two Times A Day 10)  Clobetasol Propionate 0.05 % Oint (Clobetasol Propionate) .... As Directed 11)  Tpn Electrolytes Ftv  Soln (Parenteral Electrolytes) .... Daily For 10 Hours 12)  Ciprofloxacin Hcl 500 Mg  Tabs (Ciprofloxacin Hcl) .... Take 1/2 Tablet By Mouth Two Times A  Day As Directed 13)  Hydrocodone-Acetaminophen 5-325 Mg Tabs (Hydrocodone-Acetaminophen) .Marland Kitchen.. 1-2 Tablets Every 6 Hours As Needed 14)  Ibuprofen 400 Mg Tabs (Ibuprofen) .... Take 1-2 Tablets Every 6 Hours As Needed 15)  Diazepam 5 Mg Tabs (Diazepam) .... Take One Tablet Every 6 Hours As Needed 16)  Vaniqa 13.9 % Crea (Eflornithine Hcl) .... Apply To Affected Area Two Times A Day. 17)  Picc Line .... Flush With Heparin After Procedure  Allergies (verified): 1)  ! Pcn 2)  ! * Ivp Dye  Review of Systems      See HPI  Physical Exam  General:  Well-developed,well-nourished,in no acute distress; alert,appropriate and cooperative throughout examination Lungs:  Clear throughout to auscultation. Heart:  Regular rate and rhythm; no murmurs, rubs,  or bruits. Abdomen:  soft, NT/ND, +BS, no suprapubic or CVA tenderness Msk:  + TTP inferior and lateral to R CVA.  good flexion and extension of back.  (-) SLR bilaterally Extremities:  no C/C/E   Impression & Recommendations:  Problem # 1:  FLANK PAIN, RIGHT (ICD-789.09) Assessment New given nitrites in urine will tx as UTI but given pt's risk factors recommended CT of abd/pelvis.  pt refused.  'just give me medicine and send me home- that's all i want to do'.  check CBC to  r/o overwhelming infxn.  continue cipro but increase dose from 1/2 tab to 1 tab two times a day.  reviewed supportive care and red flags that should prompt return.  Pt expresses understanding and is in agreement w/ this plan. Her updated medication list for this problem includes:    Hydrocodone-acetaminophen 5-325 Mg Tabs (Hydrocodone-acetaminophen) .Marland Kitchen... 1-2 tablets every 6 hours as needed    Ibuprofen 400 Mg Tabs (Ibuprofen) .Marland Kitchen... Take 1-2 tablets every 6 hours as needed  Orders: Specimen Handling (16109) T-Culture, Urine (60454-09811) Venipuncture (91478) Specimen Handling (29562) TLB-CBC Platelet - w/Differential (85025-CBCD) TLB-BMP (Basic Metabolic Panel-BMET)  (80048-METABOL)  Complete Medication List: 1)  Vitamin D 13086 Unit Caps (Ergocalciferol) .... Take 1 by mouth alternating 1-2 capsules 2)  Omeprazole 40 Mg Cpdr (Omeprazole) .Marland Kitchen.. 1 by mouth two times a day 3)  Xifaxan 550 Mg Tabs (Rifaximin) .Marland Kitchen.. 1 by mouth two times a day x 7 days alternating with cipro as directed 4)  Coumadin 10 Mg Tabs (Warfarin sodium) .... As directed 5)  Promethazine Hcl 25 Mg Tabs (Promethazine hcl) .... As needed 6)  Benadryl 25 Mg Caps (Diphenhydramine hcl) .... As needed 7)  Imodium A-d 2 Mg Tabs (Loperamide hcl) .... Two tablets before each meal daily 8)  Tums 500 Mg Chew (Calcium carbonate antacid) .... Three tablets every day 9)  Actigall 300 Mg Caps (Ursodiol) .... Take 1 tablet by mouth two times a day 10)  Clobetasol Propionate 0.05 % Oint (Clobetasol propionate) .... As directed 11)  Tpn Electrolytes Ftv Soln (Parenteral electrolytes) .... Daily for 10 hours 12)  Ciprofloxacin Hcl 500 Mg Tabs (Ciprofloxacin hcl) .... Take 1 tablet by mouth two times a day x5 days 13)  Hydrocodone-acetaminophen 5-325 Mg Tabs (Hydrocodone-acetaminophen) .Marland Kitchen.. 1-2 tablets every 6 hours as needed 14)  Ibuprofen 400 Mg Tabs (Ibuprofen) .... Take 1-2 tablets every 6 hours as needed 15)  Diazepam 5 Mg Tabs (Diazepam) .... Take one tablet every 6 hours as needed 16)  Vaniqa 13.9 % Crea (Eflornithine hcl) .... Apply to affected area two times a day. 17)  Picc Line  .... Flush with heparin after procedure  Patient Instructions: 1)  Increase the Cipro to 1 pill two times a day x5 days for a presumed UTI 2)  We'll have the culture results on Monday and let you know if we need to change the antibiotics 3)  Drink plenty of fluids 4)  We'll notify you of your lab results 5)  Take the Diazepam, Tylenol, and use a heating pad for pain relief 6)  If your pain worsens over the weekend GO TO THE ER!! 7)  Call with any questions or concerns 8)  Hang in there! Prescriptions: CIPROFLOXACIN  HCL 500 MG  TABS (CIPROFLOXACIN HCL) take 1 tablet by mouth two times a day x5 days  #10 x 0   Entered and Authorized by:   [name] MD   Signed by:   [name] MD on 04/12/2010   Method used:   Electronically to        [facility] [address] [id number]* (retail)       [address]       [place], [place]  [address]       Ph: [phone]       Fax: [phone]   RxID:   [id number]    Orders Added: 1)  Specimen Handling [99000] 2)  T-Culture, Urine [[id number]] 3)  Venipuncture [66063]  4)  Specimen Handling [99000] 5)  TLB-CBC Platelet - w/Differential [85025-CBCD] 6)  TLB-BMP (Basic Metabolic Panel-BMET) [80048-METABOL] 7)  Est. Patient Level III [16109]    Laboratory Results   Urine Tests    Routine Urinalysis   Glucose: negative   (Normal Range: Negative) Bilirubin: negative   (Normal Range: Negative) Ketone: negative   (Normal Range: Negative) Spec. Gravity: >=1.030   (Normal Range: 1.003-1.035) Blood: trace-intact   (Normal Range: Negative) pH: 6.0   (Normal Range: 5.0-8.0) Protein: negative   (Normal Range: Negative) Urobilinogen: 0.2   (Normal Range: 0-1) Nitrite: positive   (Normal Range: Negative) Leukocyte Esterace: negative   (Normal Range: Negative)

## 2010-06-13 NOTE — Progress Notes (Signed)
Summary: Next Twinrix date   Phone Note Call from Patient Call back at Home Phone [phone]   Caller: Patient Call For: Dr. Leone Payor Reason for Call: Talk to Nurse Summary of Call: Calling about when her next Hep injection should be sch'd Initial call taken by: Karna Christmas,  November 30, 2009 11:12 AM  Follow-up for Phone Call        LM to [facility] at home number.  (Pt is due after September 1st). Francee Piccolo CMA Duncan Dull)  November 30, 2009 2:29 PM   RC from pt.  Advised of the above.  She declines to make appt today, but will call back to schedule. Follow-up by: Francee Piccolo CMA Duncan Dull),  November 30, 2009 4:07 PM

## 2010-06-13 NOTE — Assessment & Plan Note (Signed)
Summary: follow up fracture collar bone/flu shot/cbs  Flu Vaccine Consent Questions     Do you have a history of severe allergic reactions to this vaccine? no    Any prior history of allergic reactions to egg and/or gelatin? no    Do you have a sensitivity to the preservative Thimersol? no    Do you have a past history of Guillan-Barre Syndrome? no    Do you currently have an acute febrile illness? no    Have you ever had a severe reaction to latex? no    Vaccine information given and explained to patient? yes    Are you currently pregnant? no    Lot Number:[id number]   Exp Date:11/09/2010   Site Given  Right Deltoid IM    Vital Signs:  Patient profile:   60 year old female Weight:      147 pounds Pulse rate:   87 / minute BP sitting:   110 / 72  (left arm)  Vitals Entered By: Doristine Devoid CMA (March 14, 2010 10:54 AM) CC: fracture L collar bone 2 weeks doing fine just some soreness    History of Present Illness: 60 yo woman here today for fx'd L clavicle.  occured on 10/21 when pt tripped going up the stairs.  seen in ER (notes reviewed).  still w/ mild TTP over L clavicle just lateral to sternoclavicular jxn.  wearing sling.  developing some Trap spasm due to sling use.  needs refill on muscle relaxer.  abnormal mole- derm appt got canceled, never rescheduled.  no bleeding or drainage from area.  facial hair- previously on [place] w/ good results.  would like to resume.  has also done electrolysis and laser hair removal.  osteoporosis- overdue for Reclast.  Current Medications (verified): 1)  Vitamin D 16109 Unit  Caps (Ergocalciferol) .... Take 1 By Mouth Alternating 1-2 Capsules 2)  Omeprazole 40 Mg Cpdr (Omeprazole) .Marland Kitchen.. 1 By Mouth Two Times A Day 3)  Xifaxan 550 Mg Tabs (Rifaximin) .Marland Kitchen.. 1 By Mouth Two Times A Day X 7 Days Alternating With Cipro As Directed 4)  Coumadin 10 Mg Tabs (Warfarin Sodium) .... As Directed 5)  Promethazine Hcl 25 Mg Tabs (Promethazine Hcl)  .... As Needed 6)  Benadryl 25 Mg Caps (Diphenhydramine Hcl) .... As Needed 7)  Imodium A-D 2 Mg Tabs (Loperamide Hcl) .... Two Tablets Before Each Meal Daily 8)  Tums 500 Mg Chew (Calcium Carbonate Antacid) .... Three Tablets Every Day 9)  Actigall 300 Mg Caps (Ursodiol) .... Take 1 Tablet By Mouth Two Times A Day 10)  Clobetasol Propionate 0.05 % Oint (Clobetasol Propionate) .... As Directed 11)  Tpn Electrolytes Ftv  Soln (Parenteral Electrolytes) .... Daily For 10 Hours 12)  Ciprofloxacin Hcl 500 Mg  Tabs (Ciprofloxacin Hcl) .... Take 1/2 Tablet By Mouth Two Times A Day As Directed 13)  Hydrocodone-Acetaminophen 5-325 Mg Tabs (Hydrocodone-Acetaminophen) .Marland Kitchen.. 1-2 Tablets Every 6 Hours As Needed 14)  Ibuprofen 400 Mg Tabs (Ibuprofen) .... Take 1-2 Tablets Every 6 Hours As Needed 15)  Diazepam 5 Mg Tabs (Diazepam) .... Take One Tablet Every 6 Hours As Needed 16)  Vaniqa 13.9 % Crea (Eflornithine Hcl) .... Apply To Affected Area Two Times A Day.  Allergies (verified): 1)  ! Pcn 2)  ! * Ivp Dye  Past History:  Past medical, surgical, family and social histories (including risk factors) reviewed, and no changes noted (except as noted below).  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  On chronic TPN. Bacterial overgrowth problems. Abnormal LFTs. Vitamin D deficiency. History of colon polyps; colonoscopy December 05, 2005, negative for     recurrent polyps. Perpiherally inserted central catheter line in place, left upper     extremity. Thrombophilia, unclear etiology.  On chronic Coumadin managed by Dr.     Leafy Ro. History of diabetes mellitus, resolved after weight loss. Prior obesity. Allergic rhinosinusitis. Osteopenia. Dental problems.  L clavicle fx- 02/2010  Past Surgical History: Reviewed history from 01/19/2008 and no changes required. multiple small bowel resection Cholecystectomy Hip  fracture surgery 11/08  Family History: Reviewed history from 11/20/2008 and no changes required. No FH of Colon Cancer: Family History of Diabetes: mother Breast Cancer- no CAD- no HTN- mother  Social History: Reviewed history from 01/19/2008 and no changes required. Patient is a former smoker.  Alcohol Use - no Daily Caffeine Use  coke colas daily Illicit Drug Use - no Patient does not get regular exercise.   Review of Systems      See HPI  Physical Exam  General:  Well-developed,well-nourished,in no acute distress; alert,appropriate and cooperative throughout examination Head:  Normocephalic and atraumatic without obvious abnormalities. No apparent alopecia or balding.  mild increase in fine facial hair on both cheeks, chin, and upper lip Neck:  + trapezius spasm, L>R Msk:  mild TTP along L clavicle, primarily lateral to sternocostal jxn Pulses:  +2 carotid, radial Extremities:  no C/C/E Skin:  suspicious looking mole on L clavicle, otherwise WNL   Impression & Recommendations:  Problem # 1:  FRACTURE, CLAVICLE, LEFT (ICD-810.00) Assessment New no obvious skeletal abnormality, some residual tenderness, still wearing sling.  continue pain meds.  Problem # 2:  MUSCLE SPASM, TRAPEZIUS MUSCLE, LEFT (ICD-728.85) Assessment: Unchanged worsening again w/ use of sling.  use Diazepam as needed.  Problem # 3:  NEVUS, ATYPICAL (ICD-216.9) Assessment: Unchanged  re-refer to derm  Orders: Dermatology Referral (Derma)  Problem # 4:  HAIR, ABNORMAL (ICD-704.2) Assessment: New given increase in facial hair will renew pt's Vaniqa script.  Problem # 5:  OSTEOPOROSIS (ICD-733.00) Assessment: New pt had labs drawn today for TPN.  overdue for Reclast.  will attempt to set up w/in the next 30 days. Her updated medication list for this problem includes:    Vitamin D 16109 Unit Caps (Ergocalciferol) .Marland Kitchen... Take 1 by mouth alternating 1-2 capsules  Complete Medication List: 1)   Vitamin D 60454 Unit Caps (Ergocalciferol) .... Take 1 by mouth alternating 1-2 capsules 2)  Omeprazole 40 Mg Cpdr (Omeprazole) .Marland Kitchen.. 1 by mouth two times a day 3)  Xifaxan 550 Mg Tabs (Rifaximin) .Marland Kitchen.. 1 by mouth two times a day x 7 days alternating with cipro as directed 4)  Coumadin 10 Mg Tabs (Warfarin sodium) .... As directed 5)  Promethazine Hcl 25 Mg Tabs (Promethazine hcl) .... As needed 6)  Benadryl 25 Mg Caps (Diphenhydramine hcl) .... As needed 7)  Imodium A-d 2 Mg Tabs (Loperamide hcl) .... Two tablets before each meal daily 8)  Tums 500 Mg Chew (Calcium carbonate antacid) .... Three tablets every day 9)  Actigall 300 Mg Caps (Ursodiol) .... Take 1 tablet by mouth two times a day 10)  Clobetasol Propionate 0.05 % Oint (Clobetasol propionate) .... As directed 11)  Tpn Electrolytes Ftv Soln (Parenteral electrolytes) .... Daily for 10 hours 12)  Ciprofloxacin Hcl 500 Mg Tabs (Ciprofloxacin hcl) .... Take 1/2 tablet by mouth two times a day as  directed 13)  Hydrocodone-acetaminophen 5-325 Mg Tabs (Hydrocodone-acetaminophen) .Marland Kitchen.. 1-2 tablets every 6 hours as needed 14)  Ibuprofen 400 Mg Tabs (Ibuprofen) .... Take 1-2 tablets every 6 hours as needed 15)  Diazepam 5 Mg Tabs (Diazepam) .... Take one tablet every 6 hours as needed 16)  Vaniqa 13.9 % Crea (Eflornithine hcl) .... Apply to affected area two times a day.  Other Orders: Admin 1st Vaccine (16109) Flu Vaccine 76yrs + [id number])  Patient Instructions: 1)  Follow up as needed or for your complete physical 2)  Use the Hydrocodone as needed for pain relief 3)  Take the Diazepam as needed for muscle spasm 4)  Use the Vaniqa as directed 5)  Call me w/ any questions or concerns 6)  Have a great holiday season!!! Prescriptions: HYDROCODONE-ACETAMINOPHEN 5-325 MG TABS (HYDROCODONE-ACETAMINOPHEN) 1-2 tablets every 6 hours as needed  #60 x 0   Entered and Authorized by:   [name] MD   Signed by:   [name] MD on  03/14/2010   Method used:   Print then Give to Patient   RxID:   [id number] VANIQA 13.9 % CREA (EFLORNITHINE HCL) apply to affected area two times a day.  #30 grams x 3   Entered and Authorized by:   [name] MD   Signed by:   [name] MD on 03/14/2010   Method used:   Print then Give to Patient   RxID:   [id number] DIAZEPAM 5 MG TABS (DIAZEPAM) take one tablet every 6 hours as needed  #45 x 0   Entered and Authorized by:   [name] MD   Signed by:   [name] MD on 03/14/2010   Method used:   Print then Give to Patient   RxID:   [id number]    Orders Added: 1)  Admin 1st Vaccine [90471] 2)  Flu Vaccine 34yrs + [44034] 3)  Est. Patient Level IV [74259] 4)  Dermatology Referral [Derma]

## 2010-06-13 NOTE — Miscellaneous (Signed)
Summary: Plans/[facility]  Plans/[facility]   Imported By: Lester Verdi 11/27/2009 09:57:55  _____________________________________________________________________  External Attachment:    Type:   Image     Comment:   External Document

## 2010-06-13 NOTE — Miscellaneous (Signed)
Summary: Plan/[facility]  Plan/[facility]   Imported By: Lester [place] 10/30/2009 10:01:55  _____________________________________________________________________  External Attachment:    Type:   Image     Comment:   External Document

## 2010-06-13 NOTE — Miscellaneous (Signed)
Summary: Lab / [facility]  Lab / [facility]   Imported By: Lennie Odor 02/05/2010 15:07:54  _____________________________________________________________________  External Attachment:    Type:   Image     Comment:   External Document

## 2010-06-13 NOTE — Medication Information (Signed)
Summary: Patient Assistance Form/[facility]  Patient Assistance Form/[facility]   Imported By: Lanelle Bal 04/02/2010 09:51:07  _____________________________________________________________________  External Attachment:    Type:   Image     Comment:   External Document

## 2010-06-13 NOTE — Progress Notes (Signed)
Summary: NEEDS STATUS OF FMLA PAPERWORK  Phone Note Call from Patient   Caller: DAUGHTER [name] FOYLES--WORK=[phone] Summary of Call: DAUGHTER [name] FOWLES SAYS FMLA WAS FAXED ABOUT THREE WEEKS AGO ABOUT THE CARE OF THIS PATIENT---SAYS SHE HAS SPOKEN TO SOMEONE RECENTLY WHO SAID THEY HAVE PAPERWORK---NEEDS FOR HER WORK  CAN WE CALL [name] ON STATUS? Initial call taken by: Jerolyn Shin,  November 15, 2009 1:03 PM  Follow-up for Phone Call        PER CHEMIRA THIS INFO NEEDS TO GO UNDER DAUGHTER [name] FOWLES MED REC NUMBER--WILL TRANSFER INFO AND CLOSE THIS NOTE Follow-up by: Jerolyn Shin,  November 15, 2009 1:13 PM

## 2010-06-13 NOTE — Miscellaneous (Signed)
   Clinical Lists Changes  Observations: Added new observation of BILI TOTAL: 0.3 mg/dL (16/02/9603 54:09) Added new observation of ALK PHOS: 182 units/L (05/07/2010 16:36) Added new observation of SGPT (ALT): 47 units/L (05/07/2010 16:36) Added new observation of SGOT (AST): 41 units/L (05/07/2010 16:36) Added new observation of PROTEIN, TOT: 8.7 g/dL (81/19/1478 29:56) Added new observation of GLOBULIN TOT: 5.5 g/dL (21/30/8657 84:69) Added new observation of ALBUMIN: 3.2 g/dL (62/95/2841 32:44) Added new observation of CALCIUM: 8.0 mg/dL (05/14/7251 66:44) Added new observation of GLUCOSE SER: 82 mg/dL (03/47/4259 56:38) Added new observation of CREATININE: 0.42 mg/dL (75/64/3329 51:88) Added new observation of BUN: 19 mg/dL (41/66/0630 16:01) Added new observation of CO2 TOTAL: 19 mmol/L (05/07/2010 16:36) Added new observation of CHLORIDE: 206 mmol/L (05/07/2010 16:36) Added new observation of POTASSIUM: 4.6 mmol/L (05/07/2010 16:36) Added new observation of SODIUM: 136 mmol/L (05/07/2010 16:36) Added new observation of PLATELETS: 166 10*3/mm3 (05/07/2010 16:36) Added new observation of MCH: 28.8 pg (05/07/2010 16:36) Added new observation of MCV: 95 fL (05/07/2010 16:36) Added new observation of HCT: 34.0 % (05/07/2010 16:36) Added new observation of HGB: 10.3 g/dL (09/32/3557 32:20) Added new observation of RBC: 3.58 10*6/mm3 (05/07/2010 16:36) Added new observation of WBC: 6.5 10*3/mm3 (05/07/2010 16:36)

## 2010-06-13 NOTE — Miscellaneous (Signed)
   Clinical Lists Changes  Observations: Added new observation of BILI TOTAL: 0.4 mg/dL (11/91/4782 95:62) Added new observation of ALK PHOS: 157 units/L (04/10/2010 14:18) Added new observation of SGPT (ALT): 19 units/L (04/10/2010 14:18) Added new observation of SGOT (AST): 21 units/L (04/10/2010 14:18) Added new observation of PROTEIN, TOT: 7.7 g/dL (13/12/6576 46:96) Added new observation of ALBUMIN: 2.7 g/dL (29/52/8413 24:40) Added new observation of MAGNESIUM: 1.9 mg/dL (03/08/2535 64:40) Added new observation of CALCIUM: 7.5 mg/dL (34/74/2595 63:87) Added new observation of GLUCOSE SER: 77 mg/dL (56/43/3295 18:84) Added new observation of CREATININE: 0.38 mg/dL (16/60/6301 60:10) Added new observation of BUN: 14 mg/dL (93/23/5573 22:02) Added new observation of CO2 TOTAL: 21 mmol/L (04/10/2010 14:18) Added new observation of CHLORIDE: 105 mmol/L (04/10/2010 14:18) Added new observation of POTASSIUM: 4.0 mmol/L (04/10/2010 14:18) Added new observation of SODIUM: 134 mmol/L (04/10/2010 14:18) Added new observation of PLATELETS: 163 10*3/mm3 (04/10/2010 14:18) Added new observation of MCH: 27.8 pg (04/10/2010 14:18) Added new observation of MCV: 90.1 fL (04/10/2010 14:18) Added new observation of HCT: 31.7 % (04/10/2010 14:18) Added new observation of HGB: 9.8 g/dL (54/27/0623 76:28) Added new observation of RBC: 3.52 10*6/mm3 (04/10/2010 14:18) Added new observation of WBC: 8.1 10*3/mm3 (04/10/2010 14:18)

## 2010-06-13 NOTE — Progress Notes (Signed)
Summary: Symptom update   ---- Converted from flag ---- ---- 10/04/2009 6:29 AM, Iva Boop MD, [facility] wrote: call her to see how she is doing and let me know ------------------------------  Phone Note Outgoing Call Call back at [facility] Phone [phone]   Call placed by: Darcey Nora RN, CGRN,  Oct 09, 2009 11:11 AM Call placed to: Patient  Follow-up for Phone Call        Patient  reports "doing good".  She had her PICC line replaced last week.  Was seen at the ER for "crick in my neck", is on muscle relaxers.  She says she is good. She would like to thank [place] for checking on her. Follow-up by: Darcey Nora RN, CGRN,  Oct 09, 2009 11:14 AM

## 2010-06-13 NOTE — Miscellaneous (Signed)
Summary: Plan of Care & Treatment/[facility]  Plan of Care & Treatment/[facility]   Imported By: Sherian Rein 09/06/2009 12:05:08  _____________________________________________________________________  External Attachment:    Type:   Image     Comment:   External Document

## 2010-06-13 NOTE — Miscellaneous (Signed)
Summary: Plan of Care & Treatment/[facility]  Plan of Care & Treatment/[facility]   Imported By: Sherian Rein 06/27/2009 07:40:42  _____________________________________________________________________  External Attachment:    Type:   Image     Comment:   External Document

## 2010-06-13 NOTE — Progress Notes (Signed)
Summary: resend rx  Phone Note Call from Patient Call back at Home Phone [phone]   Caller: Patient Call For: Leone Payor Reason for Call: Refill Medication, Talk to Nurse Summary of Call: Patient states that the pharmacy does not have her rx, please resend them asap because she is going out of town. Initial call taken by: Tawni Levy,  Ellee 20, 2011 3:53 PM  Follow-up for Phone Call        notified pt that rx had been sent.  Also advised pt that she is due for follow up.  Appt scheduled for 10/01/09. Follow-up by: Francee Piccolo CMA Duncan Dull),  Khaliya 20, 2011 4:34 PM

## 2010-06-13 NOTE — Miscellaneous (Signed)
[facility]: [facility]   Imported By: Lester [place] 10/24/2009 09:18:55  _____________________________________________________________________  External Attachment:    Type:   Image     Comment:   External Document

## 2010-06-13 NOTE — Assessment & Plan Note (Signed)
Summary: fever/cough//kn   Vital Signs:  Patient profile:   60 year old female Weight:      147 pounds BMI:     22.60 O2 Sat:      98 % on Room air Temp:     98.5 degrees F oral Pulse rate:   78 / minute BP sitting:   110 / 60  (left arm)  Vitals Entered By: Doristine Devoid CMA (April 25, 2010 9:19 AM)  O2 Flow:  Room air CC: cough x2- wks along w/ low grade temp    History of Present Illness: 60 yo woman here today for dry cough and low grade temps (99-101).  has been feeling poorly for 2 weeks.  will have daily chills.  fatigue.  pain w/ coughing under the R ribs.  denies ear pain, nasal congestion, facial pain, sore throat.  labs done 12/12 show WBC of 8.5, hgb of 8.8, low Na of 130, low Ca of 7.7.  has appt for Aranesp tomorrow.    Current Medications (verified): 1)  Vitamin D 16109 Unit  Caps (Ergocalciferol) .... Take 1 By Mouth Alternating 1-2 Capsules 2)  Omeprazole 40 Mg Cpdr (Omeprazole) .Marland Kitchen.. 1 By Mouth Two Times A Day 3)  Xifaxan 550 Mg Tabs (Rifaximin) .Marland Kitchen.. 1 By Mouth Two Times A Day X 61 Days Alternating With Cipro As Directed 4)  Coumadin 10 Mg Tabs (Warfarin Sodium) .... As Directed 5)  Promethazine Hcl 25 Mg Tabs (Promethazine Hcl) .... As Needed 6)  Benadryl 25 Mg Caps (Diphenhydramine Hcl) .... As Needed 7)  Imodium A-D 2 Mg Tabs (Loperamide Hcl) .... Two Tablets Before Each Meal Daily 8)  Tums 500 Mg Chew (Calcium Carbonate Antacid) .... Three Tablets Every Day 9)  Actigall 300 Mg Caps (Ursodiol) .... Take 1 Tablet By Mouth Two Times A Day 10)  Clobetasol Propionate 0.05 % Oint (Clobetasol Propionate) .... As Directed 11)  Tpn Electrolytes Ftv  Soln (Parenteral Electrolytes) .... Daily For 10 Hours 12)  Ciprofloxacin Hcl 500 Mg  Tabs (Ciprofloxacin Hcl) .... Take 1 Tablet By Mouth Two Times A Day X5 Days 13)  Hydrocodone-Acetaminophen 5-325 Mg Tabs (Hydrocodone-Acetaminophen) .Marland Kitchen.. 1-2 Tablets Every 6 Hours As Needed 14)  Ibuprofen 400 Mg Tabs (Ibuprofen) ....  Take 1-2 Tablets Every 6 Hours As Needed 15)  Diazepam 5 Mg Tabs (Diazepam) .... Take One Tablet Every 6 Hours As Needed 16)  Vaniqa 13.9 % Crea (Eflornithine Hcl) .... Apply To Affected Area Two Times A Day. 17)  Picc Line .... Flush With Heparin After Procedure  Allergies (verified): 1)  ! Pcn 2)  ! * Ivp Dye  Review of Systems      See HPI  Physical Exam  General:  pale, appears tired Head:  NCAT, no TTP over sinuses Eyes:  no injxn or inflammation Ears:  External ear exam shows no significant lesions or deformities.  Otoscopic examination reveals clear canals, tympanic membranes are intact bilaterally without bulging, retraction, inflammation or discharge. Hearing is grossly normal bilaterally. Nose:  External nasal examination shows no deformity or inflammation. Nasal mucosa are pink and moist without lesions or exudates. Mouth:  poor dentition w/ multiple missing teeth, oropharynx WNL Neck:  No deformities, masses, or tenderness noted. Lungs:  Clear throughout to auscultation but decreased BS on R.  dry cough Heart:  Regular rate and rhythm; no murmurs, rubs,  or bruits.   Impression & Recommendations:  Problem # 1:  COUGH (ICD-786.2) Assessment New concern for PNA given temp, cough.  check CXR.  start IV Avelox w/ hx of short gut to ensure absorption. Orders: T-2 View CXR (71020TC)  Problem # 2:  FEVER UNSPECIFIED (ICD-780.60) Assessment: Unchanged given pt's PICC line will get blood cxs to r/o sepsis.  unlikely given pt's normal WBC on Monday, but necessary to r/o given pt's complicated medical hx. Orders: Venipuncture (04540) T-Culture, Blood Routine ([id number])  Complete Medication List: 1)  Vitamin D 95621 Unit Caps (Ergocalciferol) .... Take 1 by mouth alternating 1-2 capsules 2)  Omeprazole 40 Mg Cpdr (Omeprazole) .Marland Kitchen.. 1 by mouth two times a day 3)  Xifaxan 550 Mg Tabs (Rifaximin) .Marland Kitchen.. 1 by mouth two times a day x 7 days alternating with cipro as directed 4)   Coumadin 10 Mg Tabs (Warfarin sodium) .... As directed 5)  Promethazine Hcl 25 Mg Tabs (Promethazine hcl) .... As needed 6)  Benadryl 25 Mg Caps (Diphenhydramine hcl) .... As needed 7)  Imodium A-d 2 Mg Tabs (Loperamide hcl) .... Two tablets before each meal daily 8)  Tums 500 Mg Chew (Calcium carbonate antacid) .... Three tablets every day 9)  Actigall 300 Mg Caps (Ursodiol) .... Take 1 tablet by mouth two times a day 10)  Clobetasol Propionate 0.05 % Oint (Clobetasol propionate) .... As directed 11)  Tpn Electrolytes Ftv Soln (Parenteral electrolytes) .... Daily for 10 hours 12)  Ciprofloxacin Hcl 500 Mg Tabs (Ciprofloxacin hcl) .... Take 1 tablet by mouth two times a day x5 days 13)  Hydrocodone-acetaminophen 5-325 Mg Tabs (Hydrocodone-acetaminophen) .Marland Kitchen.. 1-2 tablets every 6 hours as needed 14)  Ibuprofen 400 Mg Tabs (Ibuprofen) .... Take 1-2 tablets every 6 hours as needed 15)  Diazepam 5 Mg Tabs (Diazepam) .... Take one tablet every 6 hours as needed 16)  Vaniqa 13.9 % Crea (Eflornithine hcl) .... Apply to affected area two times a day. 17)  Picc Line  .... Flush with heparin after procedure 18)  Avelox 400 Mg/268ml Soln (Moxifloxacin hcl in nacl) .... Infuse 400mg  daily x 10 days  Patient Instructions: 1)  We will get the blood cultures today 2)  Go get your chest xray at [facility] on [address] 3)  We'll start the daily Avelox infusion 4)  If persistent fevers, chills, pain, or other concerns- please go to the ER for evaluation 5)  Call with any questions or concerns 6)  Hang in there!! Prescriptions: AVELOX 400 MG/250ML SOLN (MOXIFLOXACIN HCL IN NACL) infuse 400mg  daily x 10 days  #QS x 0   Entered and Authorized by:   [name] MD   Signed by:   [name] MD on 04/25/2010   Method used:   Print then Give to Patient   RxID:   [id number]    Orders Added: 1)  T-2 View CXR [71020TC] 2)  Venipuncture [41324] 3)  T-Culture, Blood Routine  [[id number]] 4)  Est. Patient Level III [40102]

## 2010-06-13 NOTE — Miscellaneous (Signed)
Summary: PT Discharge/[facility]  PT Discharge/[facility]   Imported By: Lanelle Bal 12/11/2009 14:21:37  _____________________________________________________________________  External Attachment:    Type:   Image     Comment:   External Document

## 2010-06-13 NOTE — Miscellaneous (Signed)
Summary: PT Initial Summary/[facility]  PT Initial [facility]   Imported By: Lanelle Bal 11/01/2009 13:22:11  _____________________________________________________________________  External Attachment:    Type:   Image     Comment:   External Document

## 2010-06-13 NOTE — Miscellaneous (Signed)
Summary: Certification & Plan of Care / [facility]  Certification & Plan of Care / [facility]   Imported By: Lennie Odor 02/05/2010 15:03:34  _____________________________________________________________________  External Attachment:    Type:   Image     Comment:   External Document

## 2010-06-13 NOTE — Progress Notes (Signed)
Summary: labs  Phone Note Outgoing Call   Call placed by: Doristine Devoid CMA,  April 15, 2010 2:00 PM Call placed to: Patient Summary of Call: e coli is resistant to pt's cipro.  needs to switch to macrobid 100mg  two times a day x7 days  Follow-up for Phone Call        patient aware of labs and urine cx results  .........Marland KitchenDoristine Devoid CMA  April 15, 2010 2:01 PM     New/Updated Medications: MACROBID 100 MG CAPS (NITROFURANTOIN MONOHYD MACRO) take one two times a day Prescriptions: MACROBID 100 MG CAPS (NITROFURANTOIN MONOHYD MACRO) take one two times a day  #14 x 0   Entered by:   Doristine Devoid CMA   Authorized by:   [name] MD   Signed by:   Doristine Devoid CMA on 04/15/2010   Method used:   Electronically to        [facility] [address] [id number]* (retail)       [address]       [place], [place]  [address]       Ph: [phone]       Fax: [phone]   RxID:   [id number]

## 2010-06-13 NOTE — Progress Notes (Signed)
Summary: xray results  Phone Note Outgoing Call   Call placed by: Doristine Devoid CMA,  April 25, 2010 11:44 AM Call placed to: Patient Summary of Call: pt has R upper lobe pneumonia.  Avelox tx will be appropriate  Follow-up for Phone Call        spoke w/ patient aware of results and she has already [facility] today.......Marland KitchenDoristine Devoid CMA  April 25, 2010 11:46 AM

## 2010-06-13 NOTE — Miscellaneous (Signed)
Summary: Plan/[facility]  Plan/[facility]   Imported By: Lester [place] 10/01/2009 09:57:23  _____________________________________________________________________  External Attachment:    Type:   Image     Comment:   External Document

## 2010-06-21 ENCOUNTER — Other Ambulatory Visit: Payer: Self-pay | Admitting: Oncology

## 2010-06-21 ENCOUNTER — Encounter (HOSPITAL_BASED_OUTPATIENT_CLINIC_OR_DEPARTMENT_OTHER): Payer: BC Managed Care – PPO | Admitting: Oncology

## 2010-06-21 DIAGNOSIS — K551 Chronic vascular disorders of intestine: Secondary | ICD-10-CM

## 2010-06-21 DIAGNOSIS — D638 Anemia in other chronic diseases classified elsewhere: Secondary | ICD-10-CM

## 2010-06-21 LAB — CBC WITH DIFFERENTIAL/PLATELET
Eosinophils Absolute: 0.4 10*3/uL (ref 0.0–0.5)
HCT: 31.4 % — ABNORMAL LOW (ref 34.8–46.6)
LYMPH%: 26.1 % (ref 14.0–49.7)
MONO#: 0.7 10*3/uL (ref 0.1–0.9)
NEUT#: 4 10*3/uL (ref 1.5–6.5)
Platelets: 154 10*3/uL (ref 145–400)
RBC: 3.48 10*6/uL — ABNORMAL LOW (ref 3.70–5.45)
WBC: 6.9 10*3/uL (ref 3.9–10.3)
nRBC: 0 % (ref 0–0)

## 2010-06-21 LAB — PROTIME-INR

## 2010-07-08 ENCOUNTER — Encounter: Payer: Self-pay | Admitting: Internal Medicine

## 2010-07-15 ENCOUNTER — Encounter: Payer: Self-pay | Admitting: Internal Medicine

## 2010-07-22 ENCOUNTER — Other Ambulatory Visit: Payer: Self-pay | Admitting: Oncology

## 2010-07-22 ENCOUNTER — Encounter (HOSPITAL_BASED_OUTPATIENT_CLINIC_OR_DEPARTMENT_OTHER): Payer: BC Managed Care – PPO | Admitting: Oncology

## 2010-07-22 DIAGNOSIS — K551 Chronic vascular disorders of intestine: Secondary | ICD-10-CM

## 2010-07-22 DIAGNOSIS — D638 Anemia in other chronic diseases classified elsewhere: Secondary | ICD-10-CM

## 2010-07-22 LAB — CBC WITH DIFFERENTIAL/PLATELET
BASO%: 0.3 % (ref 0.0–2.0)
EOS%: 4.7 % (ref 0.0–7.0)
HCT: 30 % — ABNORMAL LOW (ref 34.8–46.6)
LYMPH%: 19.8 % (ref 14.0–49.7)
MCH: 27.7 pg (ref 25.1–34.0)
MCHC: 31.7 g/dL (ref 31.5–36.0)
MCV: 87.5 fL (ref 79.5–101.0)
MONO#: 0.7 10*3/uL (ref 0.1–0.9)
MONO%: 9.4 % (ref 0.0–14.0)
NEUT%: 65.8 % (ref 38.4–76.8)
Platelets: 214 10*3/uL (ref 145–400)
RBC: 3.43 10*6/uL — ABNORMAL LOW (ref 3.70–5.45)

## 2010-07-22 LAB — PROTIME-INR: Protime: 54 Seconds — ABNORMAL HIGH (ref 10.6–13.4)

## 2010-07-23 NOTE — Miscellaneous (Signed)
Summary: Care Plans/[facility]  Care Plans/[facility]   Imported By: Sherian Rein 07/18/2010 11:14:54  _____________________________________________________________________  External Attachment:    Type:   Image     Comment:   External Document

## 2010-07-24 ENCOUNTER — Telehealth: Payer: Self-pay | Admitting: Internal Medicine

## 2010-07-30 NOTE — Progress Notes (Signed)
Summary: speak to nurse   Phone Note Call from Patient Call back at Home Phone [phone]   Caller: Patient Call For: Dr Leone Payor Reason for Call: Talk to Nurse Summary of Call: Patient states that she does not need an appt to get her Cipro wants to speak to nurse. Initial call taken by: Tawni Levy,  July 24, 2010 3:28 PM  Follow-up for Phone Call        Can we refill this patient's Cipro? Last REV was 09/2009. Does she need another rev or can we just refill? Follow-up by: Christie Nottingham CMA Duncan Dull),  July 24, 2010 3:35 PM  Additional Follow-up for Phone Call Additional follow up Details #1::        Ok - I clicked it She should see me in May for annual check up Additional Follow-up by: Iva Boop MD, Clementeen Graham,  July 24, 2010 4:55 PM    Additional Follow-up for Phone Call Additional follow up Details #2::    Informed pt to call back in Nyla to schedule a REV for May and that her prescription was sent to her pharmacy.  Follow-up by: Christie Nottingham CMA Duncan Dull),  July 25, 2010 7:55 AM  Prescriptions: CIPROFLOXACIN HCL 500 MG  TABS (CIPROFLOXACIN HCL) take 1 tablet by mouth two times a day x5 days  #10 x 0   Entered and Authorized by:   Iva Boop MD, [facility]   Signed by:   Iva Boop MD, FACG on 07/24/2010   Method used:   Electronically to        [facility] [address] [id number]* (retail)       [address]       [place], [place]  [address]       Ph: [phone]       Fax: [phone]   RxID:   [id number]

## 2010-08-01 ENCOUNTER — Other Ambulatory Visit: Payer: Self-pay | Admitting: Oncology

## 2010-08-01 ENCOUNTER — Encounter (HOSPITAL_BASED_OUTPATIENT_CLINIC_OR_DEPARTMENT_OTHER): Payer: BC Managed Care – PPO | Admitting: Oncology

## 2010-08-01 DIAGNOSIS — D638 Anemia in other chronic diseases classified elsewhere: Secondary | ICD-10-CM

## 2010-08-01 DIAGNOSIS — K551 Chronic vascular disorders of intestine: Secondary | ICD-10-CM

## 2010-08-01 DIAGNOSIS — I749 Embolism and thrombosis of unspecified artery: Secondary | ICD-10-CM

## 2010-08-01 DIAGNOSIS — Z7901 Long term (current) use of anticoagulants: Secondary | ICD-10-CM

## 2010-08-01 LAB — PROTIME-INR: Protime: 32.4 Seconds — ABNORMAL HIGH (ref 10.6–13.4)

## 2010-08-16 ENCOUNTER — Other Ambulatory Visit: Payer: Self-pay | Admitting: Oncology

## 2010-08-16 ENCOUNTER — Encounter (HOSPITAL_BASED_OUTPATIENT_CLINIC_OR_DEPARTMENT_OTHER): Payer: BC Managed Care – PPO | Admitting: Oncology

## 2010-08-16 DIAGNOSIS — K551 Chronic vascular disorders of intestine: Secondary | ICD-10-CM

## 2010-08-16 DIAGNOSIS — D638 Anemia in other chronic diseases classified elsewhere: Secondary | ICD-10-CM

## 2010-08-16 LAB — PROTIME-INR
INR: 2.9 (ref 2.00–3.50)
Protime: 34.8 Seconds — ABNORMAL HIGH (ref 10.6–13.4)

## 2010-08-16 LAB — CBC WITH DIFFERENTIAL/PLATELET
EOS%: 7.4 % — ABNORMAL HIGH (ref 0.0–7.0)
Eosinophils Absolute: 0.4 10*3/uL (ref 0.0–0.5)
MCV: 89.9 fL (ref 79.5–101.0)
MONO%: 13.3 % (ref 0.0–14.0)
NEUT#: 2.7 10*3/uL (ref 1.5–6.5)
RBC: 3.45 10*6/uL — ABNORMAL LOW (ref 3.70–5.45)
RDW: 16.3 % — ABNORMAL HIGH (ref 11.2–14.5)
lymph#: 1.9 10*3/uL (ref 0.9–3.3)
nRBC: 0 % (ref 0–0)

## 2010-08-19 ENCOUNTER — Encounter: Payer: Self-pay | Admitting: Internal Medicine

## 2010-08-26 ENCOUNTER — Telehealth: Payer: Self-pay | Admitting: Internal Medicine

## 2010-08-26 ENCOUNTER — Other Ambulatory Visit: Payer: Self-pay | Admitting: Internal Medicine

## 2010-08-26 ENCOUNTER — Ambulatory Visit (HOSPITAL_COMMUNITY)
Admission: RE | Admit: 2010-08-26 | Discharge: 2010-08-26 | Disposition: A | Discharge status: Home / Self Care | Payer: BC Managed Care – PPO | Source: Ambulatory Visit | Attending: Internal Medicine | Admitting: Internal Medicine

## 2010-08-26 DIAGNOSIS — T82598A Other mechanical complication of other cardiac and vascular devices and implants, initial encounter: Secondary | ICD-10-CM | POA: Insufficient documentation

## 2010-08-26 DIAGNOSIS — T82838A Hemorrhage of vascular prosthetic devices, implants and grafts, initial encounter: Secondary | ICD-10-CM

## 2010-08-26 DIAGNOSIS — Y849 Medical procedure, unspecified as the cause of abnormal reaction of the patient, or of later complication, without mention of misadventure at the time of the procedure: Secondary | ICD-10-CM | POA: Insufficient documentation

## 2010-08-26 NOTE — Telephone Encounter (Signed)
Patient has a leak in her PICC line.  The line has been in place about 1 year.  The leak is not at the insertion site, it is a very small leak, just below the hub.  I have arranged for her to go to [facility] radiology for them to evaluate and possibly replace if necessary.  Pt is asked to go there now.  Pt verbalized understanding.

## 2010-09-12 ENCOUNTER — Other Ambulatory Visit: Payer: Self-pay | Admitting: Oncology

## 2010-09-12 ENCOUNTER — Encounter (HOSPITAL_BASED_OUTPATIENT_CLINIC_OR_DEPARTMENT_OTHER): Payer: BC Managed Care – PPO | Admitting: Oncology

## 2010-09-12 DIAGNOSIS — D638 Anemia in other chronic diseases classified elsewhere: Secondary | ICD-10-CM

## 2010-09-12 DIAGNOSIS — K551 Chronic vascular disorders of intestine: Secondary | ICD-10-CM

## 2010-09-12 LAB — CBC WITH DIFFERENTIAL/PLATELET
BASO%: 1.1 % (ref 0.0–2.0)
HCT: 32.6 % — ABNORMAL LOW (ref 34.8–46.6)
LYMPH%: 41.8 % (ref 14.0–49.7)
MCHC: 30.7 g/dL — ABNORMAL LOW (ref 31.5–36.0)
MCV: 91.1 fL (ref 79.5–101.0)
MONO#: 0.4 10*3/uL (ref 0.1–0.9)
MONO%: 9.4 % (ref 0.0–14.0)
NEUT%: 37.8 % — ABNORMAL LOW (ref 38.4–76.8)
Platelets: 131 10*3/uL — ABNORMAL LOW (ref 145–400)
RBC: 3.58 10*6/uL — ABNORMAL LOW (ref 3.70–5.45)
nRBC: 0 % (ref 0–0)

## 2010-09-12 LAB — PROTIME-INR

## 2010-09-13 ENCOUNTER — Encounter: Payer: Self-pay | Admitting: Internal Medicine

## 2010-09-14 ENCOUNTER — Other Ambulatory Visit: Payer: Self-pay | Admitting: Internal Medicine

## 2010-09-16 NOTE — Telephone Encounter (Signed)
NEEDS OFFICE VISIT FOR ANY FURTHER REFILLS! 

## 2010-09-19 ENCOUNTER — Other Ambulatory Visit: Payer: Self-pay | Admitting: Oncology

## 2010-09-19 ENCOUNTER — Encounter (HOSPITAL_BASED_OUTPATIENT_CLINIC_OR_DEPARTMENT_OTHER): Payer: BC Managed Care – PPO | Admitting: Oncology

## 2010-09-19 DIAGNOSIS — K551 Chronic vascular disorders of intestine: Secondary | ICD-10-CM

## 2010-09-19 DIAGNOSIS — I749 Embolism and thrombosis of unspecified artery: Secondary | ICD-10-CM

## 2010-09-19 DIAGNOSIS — D638 Anemia in other chronic diseases classified elsewhere: Secondary | ICD-10-CM

## 2010-09-19 DIAGNOSIS — Z7901 Long term (current) use of anticoagulants: Secondary | ICD-10-CM

## 2010-09-19 LAB — PROTIME-INR: Protime: 30 Seconds — ABNORMAL HIGH (ref 10.6–13.4)

## 2010-09-24 NOTE — Assessment & Plan Note (Signed)
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  See February 25, 2006  list, plus note of October 28, 2006.   [name] [name] fell and broke her hip.  She was cared for by Dr. Simonne Come and  had internal fixation, left femoral neck fracture with 3/8 cannulated  screws on March 18, 2007.  She was hospitalized for a brief time and  she did pretty well with that.  She has lost weight because her appetite  was off that she says.  She has put on about 10 pounds.  Rotating Cipro  and Augmentin, 1 week on, 1 week off with those.  Has been doing a good  job of controlling her diarrhea.  Her PICC line is in the left upper  extremity and intact without problems, and she is still using that.  Her  laboratory studies have been reviewed over time and show fluctuating  mild increase in LFTs.  She saw Dr. Julieta Gutting at [facility] and is thought not to  have significant liver disease at this time, though he knows she is at  risk of problems there.  She to see Dr. Link Snuffer in [place] some time.  She is to go to the dental clinic as well in the late winter, that got  postponed because of her hip fracture.  That is in February.   Medications are listed and reviewed in the chart.   ALLERGIES LISTED AND REVIEWED IN THE CHART AND UPDATED.   PHYSICAL EXAMINATION:  Shows her to be thin-looking, a little bit below  weight of 148 pounds.  Pulse 68.  Blood pressure 110/68.  The eyes are  anicteric.  ENT:  Shows poor dentition.  NECK:  Supple, no mass.  CHEST:  Clear.  HEART:  S1 and S2, without any murmurs or gallops.  There are no bruits  in the neck or over the chest wall that could potentially be associated  with  her indwelling PIC line in the left upper extremity.  ABDOMEN:  Shows loose skin, is soft and nontender.  There is a chronic  area of fullness and firmness in the inner most mass, affecting the  right lower quadrant that is really unchanged, or perhaps even smaller.  LOWER EXTREMITIES:  Free of edema.  PSYCHIATRIC/NEUROLOGIC:  She is alert and oriented x3.  Grossly  nonfocal.  She has her usual very pleasant affect.   ASSESSMENT:  1. 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  2. Hypovitaminosis D.  3. Osteopenia in the past.  4. Chronic bacterial overgrown.  5. Fluctuating liver function tests.   PLAN:  1. Get DEXA scan, recheck vitamin D  level.  2. She needs gynecologic followup.  From our discussion it sounds like      she will see either Dr. Cathey Endow or Dr. Linford Arnold for Pap smears, etc,      also for family practice care, i.e. general medical care, as she      does not have a primary care physician.  3. Schedule mammogram as she says it has been awhile since she had      one.  Will defer breast exam to primary care followup that she will      obtain in the next couple of months.  4. Keep followup in [facility] with Dr. Nada Maclachlan replacement, as well      as the dental clinic and Dr. Julieta Gutting as appropriate.  5. Continue to monitor the [facility] line.  I did check that today and the      site looks fine.  There is no inflammation or problems there.  6. Continue ongoing Coumadin therapy and care through Dr. Welton Flakes of the      [facility].  7. I will see her in 3 months.  8. We did discuss the use of Byetta which has been shown in small      trials to help people recover from short bowel syndrome.  I am      concerned about the potential possibility of pancreatitis.  I have      asked her to discuss that with the doctors at [facility] to see if       they knew of any local trials, the only one I saw was on the [facility] at [place].  We would be curious about their opinion on that.      We may want to try that in the future, but will hold off for now.     [name] Boop, MD,FACG  Electronically Signed    [name]/MedQ  DD: 05/02/2007  DT: 05/03/2007  Job #: [id number]   cc:   Drue Second, MD  Jason Coop, MD  Nani Gasser, M.D.  Britt Bolognese, MD

## 2010-09-24 NOTE — Assessment & Plan Note (Signed)
[facility]                         GASTROENTEROLOGY OFFICE NOTE   NAME:[name] [name], [name] [name]                         MRN:          [id number]  DATE:10/28/2006                            DOB:          March 04, 1951    CHIEF COMPLAINT:  Followup short bowel syndrome.   PROBLEMS:  See February 25, 2006 list.   INTERVAL HISTORY:  Murdis has been to see Dr. Link Snuffer.  I have his consult  and followup note from Sep 11, 2006.  She is relatively stable.  Her  weight goes up and down.  Bowel movements 5 or 6 a day.  He prescribes  Motofen, a newer antidiarrheal, but there is a production backlog on it  and she has not gotten it yet.  She has a dilated loop of small bowel  seen on an MRI December 2007 at [facility].  He wondered whether she should  have a small bowel follow through and deferred to me.  LFTs have  generally been normal, though more recently, she has had a mild  evaluation in transaminases again with alkaline phosphatase 106, AST 40,  ALT 68.   MEDICATIONS:  Listed and reviewed in the chart.  She had a new PICC line  placed recently in the left upper extremity.  Same spot.  She is  considering permanent venous access, but has been concerned about  infections, as she sees people at the [facility], where  she gets her thrombosis issues followed up on.  I explained to her that  those are cancer patients and not short bowel patients, and that the  risk of infection should be much less.   PHYSICAL EXAM:  Weight 153 pounds, pulse 64, blood pressure 106/60.  ABDOMEN:  Soft.  There is scar tissue around the umbilicus and inferior  to it.  This is persistently present.  There is no hepatosplenomegaly.  SKIN:  Warm and dry without acute rashes.  Alert and oriented x3.  There is no lower extremity edema.  The PICC line site looks fine.   Note, the patient has been considering disability due to financial  pressures from all of her medical bills.   Emotionally, this is a tough  decision for her, she has worked up until about 6 years ago and then she  took care of her parents.   ASSESSMENT:  1. 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  2. Vitamin D deficiency.  3. Chronic total parenteral nutrition.  4. Dilated loop of bowel on MRI of unclear significance.  5. Abnormal transaminases, fatty liver associated with total      parenteral nutrition, presumed.   PLAN:  1. Recheck vitamin D level soon.  2. I am going to look into any trials on Byetta that might be      available for her.  This is a new diabetes drug that was tried in      short bowel syndrome and actually allowed them to eat, absorb, and      get off TPN in a very small  trial.  She may want to try it on her      own.  We will look into that, but first we will look for trials      where she could get the drug without cost and have followup.  3. I am willing to support her potential claim for disability. I do      believe she is medically disabled and deserving of disability      payments from a medical perspective.  I think with her diarrhea and      PICC line, she could not return to her former occupations.  4. Return to see me in several months.  We will arrange as I followup      with her labs.  5. She will call me when she is ready to see a surgeon regarding      permanent venous access.  I think something like a [facility]man would      make sense.  It sounds like she is closer to that decision.  6. Continue followup with Dr. Julieta Gutting at [facility]  7. She says Dr. Link Snuffer is retiring and she has been referred to      another physician there and she will continue followup up there.  8. She has an appointment in July with [facility] dental clinic to try to      help with her poor dentition.     Iva Boop, MD,FACG  Electronically Signed    [name]/MedQ  DD: 10/28/2006  DT: 10/28/2006  Job #: [id number]   cc:   Jason Coop, MD  Midge Aver, MD  Drue Second, MD

## 2010-09-24 NOTE — Discharge Summary (Signed)
[name] [name], [name] [name]                  ACCOUNT NO.:  [id number]   MEDICAL RECORD NO.:  [id number]          PATIENT TYPE:  INP   LOCATION:  1612                         FACILITY:  [facility]   PHYSICIAN:  Marlowe Kays, M.D.  DATE OF BIRTH:  [birth year]   DATE OF ADMISSION:  03/15/2007  DATE OF DISCHARGE:  03/21/2007                               DISCHARGE SUMMARY   ADMISSION DIAGNOSES:  1. Femoral neck fracture of the left hip.  2. Ischemic bowel mesentery bowel syndrome.   DISCHARGE DIAGNOSES:  1. Femoral neck fracture of the left hip.  2. Ischemic bowel mesentery bowel syndrome.  3. Postoperative anemia in the face of ongoing anemia (chronic).   OPERATION:  On March 18, 2007, the patient underwent internal fixation  left femoral neck fracture with 3 ACE cannulated screws.  Dr. Jacki Cones assisted.   BRIEF HISTORY:  This 60 year old lady fell at her friend's home.  She  had pain into her left hip.  She tripped over a Training and development officer in  her yard and fell in a concrete area.  She was able to get up and then  go into the house but had increasing pain in the left hip area.  Was  taken to the emergency room via ambulance and a nondisplaced femoral  neck fracture of the left hip was seen.  The patient had been on  Coumadin for a considerable amount of time for ischemic and mesentery  bowel syndrome with a history of thrombus.  We had to wait for the blood  to come down to a safe level as far as the INR was concerned as well as  the PT and PTT so she would not have gross bleeding.  Once this was down  to acceptable limits both for surgery as well as anesthesia, we went  ahead with the above procedure.  The patient did well postoperatively.  She was nearing the goal for therapeutic anticoagulation.  It is on my  note that she was given Lovenox after coming off the Coumadin  preoperatively to prevent any incident with the __________.   The patient was kept at touchdown  weightbearing on the operative side so  as not to stress the internal fixation.  Neurovascular remained intact  to the operative extremity.  She remained quite cheerful and  active,  striving to maintain her independence while in the hospital.  On the day  of discharge, neurovascular was intact to the operative extremity.  The  wound was clean and dry.  The patient will continue with Coumadin as she  has her own Coumadin at home and continue under the direction of Dr.  Welton Flakes and Dr. Leone Payor at [facility].   LABORATORY VALUES IN THE HOSPITAL:  Hematological showed a preoperative  CBC with anemia.  The RBC was 3.54, hemoglobin was 11.7, hematocrit  34.3.  Platelets were down somewhat at 136.  Final hemoglobin was 8.5  with a hematocrit of 25.7 and platelets were at 120.  Blood chemistries  were essentially normal.  It was noted that the albumin was 2.8.  Triglycerides  were 81.  Chest x-ray showed no active lung disease.  Tip  of central venous catheter was below SVC, which was done  postoperatively.   CONDITION ON DISCHARGE:  Improved, stable.  The patient discharged to  her home.  She is to continue with her medications per her internal  medicine physicians.   MEDICATIONS AT DISCHARGE:  1. Augmentin __________ mg daily.  2. Benadryl 25 mg p.r.n.  3. Urised t.i.d.  4. Cipro 500 mg daily.  5. Coumadin 20 mg daily.  6. Phenergan 25 mg p.r.n.  7. Imodium 2 mg p.r.n.  8. Vitamin D 500 units daily.  9. Tylox for pain.  10.Robaxin as a muscle relaxant.  11.Coumadin as mentioned above.   Return to see [place] 2 weeks after date of surgery.  Keep a dry dressing  over her wound.  Continue with touchdown to very light partial  weightbearing until seen by [place].  Follow up with family physicians as  indicated.  Continue with diet as appreciated at home.      Dooley L. Cherlynn June.    ______________________________  Marlowe Kays, M.D.    DLU/MEDQ  D:  04/14/2007  T:  04/14/2007  Job:   [id number]   cc:   Marlowe Kays, M.D.  Fax: [phone]   Drue Second, MD  Fax: [phone]   Iva Boop, MD,FACG  [facility]  [address] [place], [place] [address]

## 2010-09-24 NOTE — Op Note (Signed)
[name], [name] NO.:  [id number]   MEDICAL RECORD NO.:  [id number]          PATIENT TYPE:  INP   LOCATION:  1612                         FACILITY:  [facility]   PHYSICIAN:  Marlowe Kays, M.D.  DATE OF BIRTH:  1950-10-11   DATE OF PROCEDURE:  03/18/2007  DATE OF DISCHARGE:                               OPERATIVE REPORT   PREOPERATIVE DIAGNOSIS:  Left femoral neck fracture.   POSTOPERATIVE DIAGNOSIS:  Left femoral neck fracture.   OPERATION:  Internal fixation of left femoral neck fracture with three  Ace cannulated screws.   SURGEON:  Marlowe Kays, M.D.   ASSISTANT:  Georges Lynch. Darrelyn Hillock, M.D.   ANESTHESIA:  General.   PATHOLOGY AND JUSTIFICATION FOR PROCEDURE:  The fracture actually curved  on November 2 when she tripped and fell.  She was brought to the  emergency room on November 3 and was found to have a left femoral neck  fracture, nondisplaced.  She was also on Coumadin and after discussing  things with her oncologist, Dr. Welton Flakes, we proceeded to take her off the  Coumadin, protect her with Lovenox, and wait for her INR to return to an  acceptable limit for surgery, is 1.2 today.   PROCEDURE:  Prophylactic antibiotic, satisfactory general anesthesia,  Foley catheter inserted.  The left hip was prepped with DuraPrep, draped  in a sterile field with Betadine shower curtain.  Time-out performed.  With the C-arm I was able to make my estimation for my initial skin  incision, which was made down through the fascia lata and through the  vastus lateralis.  The lateral femoral cortex was exposed and a small  drill hole was placed over the lateral femoral cortex and using a 135-  degree angle guide, I placed a guide pin up through the midportion of  the femoral neck and head on both AP and lateral projections.  Then  using the guide I was able to place pins above and below and measuring  them, they measured from inferior to superior 95, 90 and 75.  I then  selected three 6.5 titanium cannulated screws, which I placed over the  guide pins within a few millimeters of the final resting place and  removed the guide pins and then tightened down the screws individually  with the tightening process followed with AP and lateral x-rays, which  appeared to be good.  When the final films were taken, the wound was  irrigated with sterile saline and soft tissues infiltrated with 0.5%  plain Marcaine.  The wound was then closed with running #1 Vicryl in the  vastus lateralis, interrupted #1 Vicryl in the fascia lata and the deep  subcutaneous tissue, 0 Vicryl followed by 2-0 Vicryl more superficially  in the subcutaneous tissue and staples in the skin.  Betadine and  Adaptic dry sterile dressing were applied.  She was gently placed in her  PACU bed and taken out in satisfactory condition with no known  complications.  Estimated blood loss was perhaps 150 mL.  No blood  replacement.  ______________________________  Marlowe Kays, M.D.    JA/MEDQ  D:  03/18/2007  T:  03/19/2007  Job:  [id number]

## 2010-09-24 NOTE — H&P (Signed)
[name] [name], MCLIN NO.:  [id number]   MEDICAL RECORD NO.:  [id number]          PATIENT TYPE:  INP   LOCATION:  1612                         FACILITY:  [facility]   PHYSICIAN:  Marlowe Kays, M.D.  DATE OF BIRTH:  [birth year]   DATE OF ADMISSION:  03/15/2007  DATE OF DISCHARGE:                              HISTORY & PHYSICAL   CHIEF COMPLAINT:  Pain in my left hip.   PRESENT ILLNESS:  A 60 year old white female seen by [place] in the emergency  room for problems concerning left hip.  Apparently, she was at a  friend's home last night around 8 o'clock and tripped over a Copy.  She fell on a concrete area.  With help, she was able to get up  and go into the house.  During the night, she had increasing pain into  the left hip area.  They decided to bring her to the emergency room  today by ambulance where x-rays revealed a nondisplaced femoral neck  fracture of the left hip.  Dr. Simonne Come saw her in the emergency room,  reviewed and evaluated the x-rays, discussed the planned internal  fixation surgery with the patient.   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  She was under the  care of Dr. Leone Payor, [facility] as well as Dr. Welton Flakes,  Oncologist.  She has had a hemigastrectomy as well as removal of 8  feet of small-bowel in the past.  She currently is on TPN artificial  nutrition primarily for her electrolyte balance.  She takes 10 hours via  PICC line out of a 24 period.   We will notify Dr. Welton Flakes as well as Dr. Leone Payor of the patient's  admission and room number.  (Dr. Simonne Come has already discussed this  with Dr. Welton Flakes).   Plan to put her on heparin protocol to bring her INR from where it is  now at 3 down to acceptable levels for surgical intervention.   PAST MEDICAL HISTORY:  1. As mentioned above, the patient had a hemigastrectomy with removal  of mesenteric as well as part of the small intestine in 2005 at      [facility]  2. Cholecystectomy in 1997.  3. Medically she has no chronic medical problems, in fact, denies any.  4. She does have dental problems secondary to TPN treatment and is      planning to have multiple dental extractions in the future at      [facility].   MEDICATIONS:  1. Coumadin which has been discontinued today, alternating 20-21 mg      daily.  2. Cipro 5 mg daily and she alternates that with Augmentin.  3. Ursodiol 300 mg p.o. t.i.d. for protection of the liver secondary      to TPN treatment.  4. She is on Vitamin D 50,000 units alternating with 100,000 units      every other day.  5. Benadryl 25 mg used for itching.  6. Phenergan for nausea.  7. Prilosec b.i.d. for her stomach.  8. Imodium a.c. daily.   ALLERGIES:  PENICILLIN CAUSED HIVES AT 60 YEARS OF AGE.   SOCIAL HISTORY:  The patient neither smokes or drinks.   FAMILY HISTORY:  Noncontributory.   REVIEW OF SYSTEMS:  CNS: No seizures or paralysis, numbness, double  vision.  RESPIRATORY:  No productive cough, no hemoptysis, no shortness  of breath.  CARDIOVASCULAR:  No chest pain.  No angina or orthopnea.  GASTROINTESTINAL:  After meals which has no restriction, she has urgent  need for evacuation.  Hence the Imodium.  GENITOURINARY:  No discharge,  dysuria or hematuria.  MUSCULOSKELETAL:  Primarily in present illness.   PHYSICAL EXAMINATION:  GENERAL:  Alert, cooperative friendly, thin 51-  year-old white female who is accompanied by her friend.  VITAL SIGNS:  Blood pressure of 98/59, pulse rate 61, respirations 18,  temperature 98.2.  HEENT: Normocephalic PERRLA.  Oropharynx is clear.  She has poor dental  hygiene.  NECK:  Supple, no lymphadenopathy.  CHEST:  Clear to auscultation.  No rhonchi or rales were auscultated  supine.  HEART:  Regular rate and rhythm.  No murmurs are heard.  ABDOMEN:  Soft.  Bowel sounds present.   GENITALIA/RECTAL/PELVIC/BREASTS:  Not done, not pertinent to present  illness.  EXTREMITIES:  She has excellent sensation both lower extremities.  Good  dorsalis pedal pulses.  The left hip is not ranged.   ADMISSION DIAGNOSIS:  1. Femoral neck fracture left hip.  2. Ischemic bowel/mesenteric bowel syndrome plan.  As soon as we get      the patient's Coumadin down to normal level, compatible with      surgery, we will go ahead with internal fixation of the left hip.      Dr. Simonne Come again explained surgical procedure as well as the      risks and benefits to the patient.      Dooley L. Cherlynn June.    ______________________________  Marlowe Kays, M.D.    DLU/MEDQ  D:  03/15/2007  T:  03/16/2007  Job:  [id number]   cc:   Marlowe Kays, M.D.  Fax: [phone]   Drue Second, MD  Fax: [phone]   Iva Boop, MD,FACG  [facility]  [address] [place], [place] [address]

## 2010-09-27 NOTE — Op Note (Signed)
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   OPERATIONS:  Small bowel resection with anastomosis of jejunum to the  ascending colon.   SURGEON:  Lebron Conners, M.D.   ASSISTANT:  Adolph Pollack, M.D.   ANESTHESIA:  General.   DESCRIPTION OF PROCEDURE:  After the patient was monitored and anesthetized  and had routine preparation and draping of the abdomen, I removed the  packing and the staples, cut the suture in the fascia, entered the abdomen  and gently dissected the omentum away from the anterior abdominal wall.  A  fair amount of dark, slightly cloudy serosanguineous fluid was present.  I  suctioned that out.  I then noted that most of the remaining small bowel was  viable.  I took down the jejunostomy, which was obviously dead, by just  cutting sutures in the skin.  I pulled it back inside and assessed it for  viability and saw that several inches more of jejunum were obviously  infarcted.  I checked again for pulsation in the small bowel mesentery and  found it to be good.  The most proximal part of the jejunum from the  ligament of Treitz for several inches distally was obviously viable and  healthy in appearance.  The transverse and right colon also were healthy in  appearance.  I segmentally divided the mesentery of the infarcted and  ischemic valve.  I ligated the vessels with 2-0 silk ligatures and suture  ligatures and then transected the bowel with cutting stapler.  I mobilized  the right  colon and hepatic flexure to get ability to put the end of the  bowel together without tension.  I laid them together side by side, held in  place with the sutures and performed a side-to-side anastomosis with the  cutting stapler.  I checked inside the bowel and saw that the mucosa all  looked viable.  After assuring good hemostasis, I closed the defect with a  linear stapler.  All of the staple lines looked as though they had good  integrity and were well perfused.  There was pulsatile blood flow in the  jejunum at the site of resection.  I then copiously irrigated the abdominal  cavity and removed the irrigant.  I placed a suction drain brought out  through the left mid abdomen near the anastomosis.  Then I closed the fascia  with running #1 Novafil and closed also with three retention sutures through  all layers of the abdominal wall.  The sponge, needle and instrument counts  were correct.  I connected the drain to suction.  The  patient was stable  through the procedure.     Will  WB/MEDQ  D:  04/12/2004  T:  04/13/2004  Job:  [id number]   cc:   Iva Boop, M.D. [facility]

## 2010-09-27 NOTE — Assessment & Plan Note (Signed)
[facility]                         GASTROENTEROLOGY OFFICE NOTE   NAME:[name] [name], [name] [name]                         MRN:          [id number]  DATE:06/04/2006                            DOB:          [birth year]    CHIEF COMPLAINT:  Followup of short bowel syndrome.   See problem list February 25, 2006 for further details.   INTERVAL HISTORY:  She has done well.  Her weight is stabilized.  PIC  line is fine.  She had an MRI at [facility], and Dr. Link Snuffer sent me a  note about it stating that it showed mild cirrhosis without ascites.  Dr. Julieta Gutting, her hepatologist there, has thought an MRI would be more  appropriate than a liver biopsy.  She is due to follow up with him in  February.  Her LFTs have normalized on ursodeoxycholic acid and rotating  antibiotics.  She takes Augmentin for a week, then is off a week, and  then takes Cipro for a week.  She has chronic diarrhea, worse when she  overeats, but she is managing to do everything she wants to do.  She  needs refills on her antibiotics and probably on her vitamin D.  She  relates that Dr. Link Snuffer talked about perhaps cutting back to 8 hours a  day of TPN.  She is on 10 hours a day right now.   Other medications are listed and reviewed in the chart.  Dr. Welton Flakes  continues to manage her Coumadin.   She does relate a stamina problem in that if she gets overly tired she  feels drunk or lightheaded, and if she takes a 20-minute nap she is  fine.  That has been going on since the fall.  There is focal weakness  or anything like that.   PHYSICAL EXAMINATION:  Shows her to be a well-developed, well-nourished,  white woman, in no acute distress.  Weight 157 pounds.  Pulse 72.  Blood pressure 110/64.  The eyes are anicteric.  NECK:  Supple.  CHEST:  Clear.  HEART:  S1 and S2.  No murmurs, rubs or gallops.  ABDOMEN:  Shows surgical scars.  There is a firm subcutaneous fullness  and mass-like lesion to the right of  her lower midline scar that is  stable and consistent with scar tissue.  I do not think it is any  hepatomegaly or ascites.  The skin is somewhat loose.  Her PIC line is intact and in good shape in the left upper extremity.   ASSESSMENT:  Malabsorption syndrome.  History of abnormal liver function  tests.  Cirrhosis suggested by MRI.  She has small bowel bacterial  overgrowth.  All in all, I think things are going reasonably well.   PLAN:  1. She is about due for a recheck of her vitamin B level.  We will      have them do that through Home Health.  2. I will refill her Augmentin and her Cipro and continue that.  3. Refill vitamin D.  4. Keep followup with the [facility].  After that, she will  schedule an appointment with Dr. Link Snuffer to consider adjustment in      her TPN.  Her weight has stabilized.  She has not gained.  If she      is losing weight or has other changes in her symptoms, she is to      call me back sooner.  5. Further plans pending clinical course.     Iva Boop, MD,FACG  Electronically Signed    [name]/MedQ  DD: 06/04/2006  DT: 06/04/2006  Job #: [id number]   cc:   Midge Aver, MD  Jason Coop, MD  Drue Second, MD

## 2010-09-27 NOTE — Op Note (Signed)
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   OPERATION PERFORMED:  1.  Thrombectomy with balloon catheter.  2.  Segmental small bowel resection with anastomosis.   SURGEON:  Lebron Conners, M.D.   ASSISTANT:  Rose Phi. Maple Hudson, M.D.   ANESTHESIA:  General.   DESCRIPTION OF OPERATION:  After the patient was monitored, anesthetized,  had a Foley catheter placed, and underwent routine preparation and draping  of the abdomen I reopened the previous abdominal incision and lengthened it  somewhat at each end.  I took out the old suture and entered the abdominal  cavity, and removed the serosanguineous fluid and very small amounts of  blood clots.  I then examined the small intestine and found that the  anastomosis was intact, and that the small bowel just distal to that looked  good, but that there was segmental infarction of a further segment of the  small bowel proximal to the anastomosis.  I then exposed the proximal  anterior aspect of the small bowel mesentery near the transverse colon  mesentery and examined the pulse there, and found a weak pulse on the  superior mesenteric artery.   I made an incision in the anterior aspect of the mesentery and cut down  through the fat until I encountered the superior mesenteric vein.  I then  dissected adjacent to that and found the superior mesenteric artery.  I  controlled it proximally with a vessel loop and  controlled several distal  branches.  I had an adequate segment for performance of an arteriotomy.   Interrogation with the Doppler disclosed weak flow into the vessel at that  point.  After the patient was heparinized and I tightened the controls I  made a longitudinal arteriotomy on the anterior aspect of the vessel where  it was nice and large.  First, I used a #3 and #4 Fogarty catheter to remove  clot from the distal branches.  Very little clot could be retrieved and what  I did retrieve was tiny bits of clot from the small distal branches.  I then  filled the distal branches with heparinized saline solution and reapplied  the controls.  I then passed the #4 catheter up the vessel into the aorta  and pulled down the inflated #4 Fogarty catheter.  On the first pass a very  large amount of thrombus came out and there was establishment of very brisk  arterial bleeding.  I then retightened the control and passed the catheter  two more times retrieving very little more clot.  I then  closed the  arteriotomy with running 5-0 Prolene suture.  I released the controls and  there was a nice brisk pulse in the artery at the site of dissection.  There  was excellent flow with the Doppler.  The noninfarcted portion of the small  bowel and right colon looked better.  There were a couple of small bleeders  that required clips or sutures and then hemostasis was good  I applied a  little Surgicel to the anterior aspect of the vessel.   I then turned attention to the bowel and picked proximal and distal sites  where I thought the bowel was viable, and divided it at those points with  the cutting stapler.  There were a couple spots of petechiae on the bowel  proximal to that, but I felt it was viable.  There was also bleeding from  vessels in the mesentery when they were clamped.  I then segmentally  resected the mesentery between Kelly clamps and ligated the vessels with 2-0  silks.  I then brought the  ends of the bowel together with sutures and  performed a side-to-side anastomosis with the cutting stapler.  I then  looked inside, saw no bleeding and saw that the mucosa looked healthy.  I  closed the remaining defect with a linear stapler and I felt the anastomosis  was secure.  I closed the mesenteric defect with interrupted sutures of 3-0  silk.  I then copiously irrigated the abdominal cavity and removed the  irrigant.   Sponge count was correct.  Needle count was correct, but there was a  discrepancy in the instrument count; and, I x-rayed to prove no retained  foreign body was pending at this time.   I closed the fascia with running #1 PDS and closed the skin loosely with  staples, and packed the wound with moist gauze.  I applied a bulky bandage.  It is anticipated that the patient will be reexplored  in 24-48 hours to  prove viability of the small bowel.      Will   WB/MEDQ  D:  04/08/2004  T:  04/09/2004  Job:  [id number]   cc:   Venita Lick. Russella Dar, M.D. [facility]

## 2010-09-27 NOTE — H&P (Signed)
NAMESHARANYA, [name] NO.:  [id number]   MEDICAL RECORD NO.:  [id number]          PATIENT TYPE:  INP   LOCATION:  0104                         FACILITY:  [facility]   PHYSICIAN:  Iva Boop, M.D. LHCDATE OF BIRTH:  [birth year]   DATE OF ADMISSION:  07/04/2004  DATE OF DISCHARGE:                                HISTORY & PHYSICAL   PROBLEM:  Weakness and weight loss, electrolyte disturbance.   HISTORY OF PRESENT ILLNESS:  [name] [name] is a pleasant 60 year old white female,  well known to Dr. 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  She required three surgeries per Dr. Orson Slick due to  progressive infarction, the last of which was done on April 12, 2004, with  small bowel resection and anastomosis of the jejunum to the ascending colon.  She has intact the most proximal part of her jejunum from the ligament of  Treitz, approximately a few inches beyond the ligament of Treitz.  She has  had difficulty with hypokalemia, hypomagnesemia, and weight loss over the  past month.  She has been eating and is able to eat solid food, says that  she has been eating three meals a day and consuming one can of Glucerna per  day.  Despite this, she has lost 9 pounds.  She says that she does have  nausea which is exacerbated by eating, though no vomiting, and usually  within an hour of the meal will have a loose stool.  She says at times, the  stool does seem to contain particles of undigested food. She does not note  any melena or hematochezia, has no complaint currently of abdominal pain.  She has been on Phenergan as needed which she says she does not take very  regularly because it makes her sleepy and groggy.  She also had been on  potassium supplements 60 mEq daily and magnesium 400 mg daily but has not  taken these over the past week due to exacerbation of her nausea.   She  generally has 3-4 bowel movements per day.  Her primary complaint is that of  feeling tired and weak in general.  On further questioning, she has been  having some cramping in her hands and has noted some swelling in the left  foot over the past few days.  She denies any discomfort in the foot.  She  has had some numbness and tingling in the left greater than right lower  extremity as well.   Most recent laboratory studies done July 03, 2004, showed hemoglobin  11.6, hematocrit 35.7, platelets 301, potassium 2.1, chloride 108, glucose  90, SGOT 46, SGPT 44, albumin 1.8, magnesium 0.6.   The patient is also felt to have a hypocoagulable state responsible for her  SMA occlusion.  She was seen in consultation by Dr. Welton Flakes during her  hospitalization and was placed on long-term Coumadin.  Apparently her INR  was high and she has been off of the Coumadin the last  few days.   CURRENT MEDICATIONS:  1.  Coumadin 2 mg p.o. q.d.  2.  Protonix 40 mg q.d.  3.  K-Dur 60 mEq daily - not taking over the past several days.  4.  Phenergan 25 mg q.6h. p.r.n.  5.  Magnesium 400 mg daily.   ALLERGIES:  PENICILLIN, CONTRAST DYE which cause pruritus.   PAST MEDICAL HISTORY:  1.  Adult-onset diabetes mellitus.  2.  Hypertension.  3.  Status post cholecystectomy.  4.  Recent diagnosis of hypercoagulable state and SMA occlusion with small      bowel infarction requiring several resections and residual short gut      syndrome.   FAMILY HISTORY:  Diabetes in her mother.   SOCIAL HISTORY:  The patient is married, self employed.  She is an ex-  smoker, having quit since her hospitalization.  No ETOH.   REVIEW OF SYSTEMS:  CARDIOVASCULAR:  Denies any chest pain or anginal  symptoms.  PULMONARY:  Denies any cough, shortness of breath, or sputum  production.  GENITOURINARY:  Denies any dysuria, urgency, or frequency.  GASTROINTESTINAL:  As above.   PHYSICAL EXAMINATION:  GENERAL:  A  well-developed, thin, chronically ill  appearing white female in no acute distress.  She is alert and oriented x3.  VITAL SIGNS:  Weight 167, blood pressure 102/64, pulse 62.  HEENT:  Atraumatic, normocephalic.  EOMI, PERRLA.  Sclerae anicteric.  She  is pale.  NECK:  Supple, without nodes.  CARDIOVASCULAR:  Regular rate and rhythm with S1 and S2.  PULMONARY:  Clear to A&P.  ABDOMEN:  Soft.  There is no focal tenderness.  She has a healing midline  incisional scar.  No mass or hepatosplenomegaly.  RECTAL:  Exam not done today.  EXTREMITIES:  Without clubbing or cyanosis.  She does have 2+ edema in the  left lower extremity to the mid shin.  There is no Homans' sign and no  tenderness.  NEUROLOGIC:  Alert and oriented x3, grossly nonfocal.   IMPRESSION:  62.  A 60 year old female with short bowel syndrome status post SMA occlusion      and small bowel infarction in November 2005.  2.  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  3.  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  4.  Weight loss secondary to above.  5.  Hypercoagulable state, on Coumadin.  6.  Left lower extremity edema, rule out deep venous thrombosis.  7.  Status post cholecystectomy.  8.  History of hypertension and adult-onset diabetes mellitus, both inactive      currently due to recent surgery, weight loss, and malabsorption.   PLAN:  1.  The patient is admitted to the service of Dr. Stan Head for IV fluid      hydration, potassium, and magnesium replacement.  2.  Dietary consultation.  Will plan to start TNA, which she will probably      need long-term.  3.  Will ask Dr. Orson Slick to see her regarding possible Port-A-Cath placement,      though she is reluctant currently      and we may place a PICC line initially.  4.  Follow coags.  5.  Check lower extremity Doppler, rule out DVT.  For details, please see      the orders.     AE/MEDQ  D:  07/04/2004  T:  07/04/2004  Job:  [id number]   cc:    Lorre Munroe., M.D.  [address]  [place]  [place] [address]   Delano Metz, M.D.  [address]  [place]  [place] [address]  Fax: [phone]

## 2010-09-27 NOTE — Consult Note (Signed)
NAMEROSALENE, WARDROP NO.:  [id number]   MEDICAL RECORD NO.:  [id number]          PATIENT TYPE:  INP   LOCATION:  0106                         [facility]ACILITY:  [facility]   PHYSICIAN:  Lorre Munroe., M.D.DATE OF BIRTH:  10-Nov-1950   DATE OF CONSULTATION:  04/05/2004  DATE OF DISCHARGE:                                   CONSULTATION   CHIEF COMPLAINT:  Abdominal pain.   HISTORY OF PRESENT ILLNESS:  A 60 year old, diabetic, white female with 70-  pound weight loss in recent months, having chronic recurring abdominal pain.  She has had endoscopy, small bowel studies, and treatment with antibiotics  and continues to have increasing pain.  CT scan done in early October showed  some thickening of the small bowel consistent with possible Crohn's disease.  The patient was acutely in pain today and repeat CT scan shows pneumotosis  and thickening of the small bowel consistent with ischemia.  White count is  25,000.  She is admitted by Dr. Russella Dar, and I was consulted to help manage  the problem.   MEDICATIONS:  She is on lisinopril, Claritin, atenolol, vitamins, Prilosec.  For her diabetes she is on metformin 500 mg a day and diet.   ALLERGIES:  PENICILLIN.   PAST MEDICAL HISTORY:  1.  She has high blood pressure.  2.  Type 2 diabetes.   FAMILY HISTORY:  Childhood illnesses unremarkable.   REVIEW OF SYSTEMS:  No cardiac symptoms or history.  She is a smoker.  She  does not drink alcoholic beverages.   PHYSICAL EXAMINATION:  GENERAL:  The patient had normal mental status but  was in acute pain.  HEAD AND NECK:  Eyes, ears, nose, mouth, and throat are unremarkable.  CHEST:  Clear to auscultation.  HEART:  Rate rapid, rhythm regular.  No murmur or gallop noted.  BREASTS: No masses.  ABDOMEN:  Tender in all four quadrants, very quiet.  No mass or organomegaly  or hernia.  No scars.  EXTREMITIES:  No edema. Good pedal pulses.   IMPRESSION:  Acute abdomen with probable  segmental infarction of the small  bowel.   PLAN:  Immediate exploration.  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  She consents to the operation.     Will   WB/MEDQ  D:  04/05/2004  T:  04/05/2004  Job:  [id number]   cc:   Delano Metz, M.D.  [address]  [place]  [place] [address]  Fax: [phone]   Venita Lick. Russella Dar, M.D. [facility]

## 2010-09-27 NOTE — Consult Note (Signed)
NAMEAVALEY, COOP NO.:  [id number]   MEDICAL RECORD NO.:  [id number]          PATIENT TYPE:  INP   LOCATION:  2101                         FACILITY:  [facility]   PHYSICIAN:  Drue Second, MD       DATE OF BIRTH:  [birth year]   DATE OF CONSULTATION:  04/11/2004  DATE OF DISCHARGE:                                   CONSULTATION   REASON FOR CONSULTATION:  Hypercoagulable state.   HISTORY OF PRESENT ILLNESS:  Ms. Swanner is a pleasant 60 year old female  admitted with acute-subacute abdominal pain for the last five months,  progressing to the point that the patient had to seek medical attention at  the emergency department.  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  A pelvic CT also showed the same findings for which he had to  undergo immediate exploration, small-bowel resection - anastomosis on  April 05, 2004.  The findings were consistent with small-bowel ischemia,  with patchy segmental infarction. Pathology report ([id number], Dr. Clelia Croft)  showed small-bowel obstruction secondary to adhesions, ischemic colitis with  mucosal ulceration and numerous vascular thrombi in medium to large caliber  arteries, near perforation, as well as acute serositis.  Also, peri-  intestinal lymph nodes with lymphoid hyperplasia and mild lymphadenitis were  seen.  Due to thrombosis of the superior mesenteric artery with segmental  bowel infarction, she underwent segmental bowel resection on April 08, 2004, with final resection of the remaining small-bowel on April 10, 2004.  Unfortunately, she will have to have another procedure, for removal  of dead bowel due to more infarction in the area, scheduled for April 12, 2004.  We were asked to see  the patient for evaluation and  recommendations, suspecting hypercoagulable state.   PAST MEDICAL HISTORY:  1.  Rule out hypercoagulable state.  2.  Adult onset diabetes mellitus, recently diagnosed.  3.  Hypertension.  4.  History of tobacco abuse.  5.  Leukocytosis secondary to steroid use in the hospital.  6.  Tobacco habituation.   PAST SURGICAL HISTORY:  1.  Status post cholecystectomy, .  2.  Status post colonoscopy March 21, 2004.  3.  Status post bowel resection anastomosis, Dr. Orson Slick, April 05, 2004.  4.  Status post thrombectomy with balloon catheter and segmental bowel      resection with anastomosis on April 08, 2004.  5.  Status post resection of most of the remaining small-bowel and cecum      with diverting jejunostomy, Dr. Orson Slick, April 10, 2004.   ALLERGIES:  1.  PENICILLIN.  2.  DYE.   CURRENT MEDICATIONS:  1.  Cipro 400 IV q.12h.  2.  Flagyl 500 mg q.8h. IV.  3.  Morphine sulfate PCA q.4h.  4.  Protonix 40 mg daily IV.  5.  Zofran p.r.n. IV.  6.  Phenergan, Narcan, Compazine, Toradol and Novolin as  directed.   REVIEW OF SYMPTOMS:  Remarkable for a 70 pound weight loss since June of  2005, accompanied by decreasing appetite.  She also has some night sweats  and initially she had loose bowel movements, until they resolved around  April 05, 2004.  She is dyspneic on exertion. She denies any productive  cough.  No history of DVT.  No abdominal pain at this time.  For the last  two weeks she has been having some nausea and vomiting.  No dysuria or gross  hematuria.  No calf tenderness or dysesthesias, no peripheral edema.   FAMILY HISTORY:  Mother died with aneurysm.  Father died with MI.  She has  no sisters or brothers.  No history of bleeding disorders in the family.  She also states that her grandmother and great-grandmothers all had  abdominal aortic aneurysms.   SOCIAL HISTORY:  The patient is married.  She has one grown daughter,  [name].  She is self-employed.  She smokes one quarter of a pack a day of cigarettes  for about 15 years.  No alcohol history.  She has a health care power of  attorney, her husband, Jmya Uliano.  [place]  Telephone number is [phone].  She is Control and instrumentation engineer.   PHYSICAL EXAMINATION:  GENERAL APPEARANCE:  This is a pale 60 year old white  female, in no acute distress, alert and oriented x3.  VITAL SIGNS:  Weight 199 pounds, height 68 inches.  HEENT:  Normocephalic and atraumatic.  PERRLA.  She has NG tube in place.  Oral mucosa clear.  NECK:  Supple.  No JVD, no cervical or supraclavicular masses.  LUNGS:  Clear to auscultation bilaterally, no axillary masses.  BREASTS:  Without masses.  CARDIOVASCULAR:  Regular rate and rhythm without murmurs, rubs, or gallops.  ABDOMEN:  Remarkable for some tenderness and distention toward the left  upper quadrant.  There is a large bandage after surgery covering the wound.  No apparent palpable spleen or liver.  GU AND RECTAL:  Deferred.  EXTREMITIES:  No clubbing, cyanosis, or edema.  She has bilateral PAS hose.  NEUROLOGIC:  Nonfocal.   LABORATORY DATA:  Hemoglobin 12.6, hematocrit 36.1, white count 22.2,  platelets 296, neutrophils 19.  PT 15.3, PTT 34, INR 1.3.  Sodium 133,  potassium 3.3, BUN 2, creatinine 0.7, glucose 96.  Total bilirubin 1.1,  alkaline phosphatase 77, AST 26, ALT 19, total protein 4.6, albumin 1.5,  calcium 7.1.   ASSESSMENT/PLAN:  The patient has been seen, examined and the chart has been  reviewed by Dr. Welton Flakes.  The patient admitted with severe abdominal pain.  She  was found to have small ischemia and pneumatosis, status post bowel  resection with anastomosis on April 05, 2004.  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  She had a thrombectomy and small-bowel resection on April 08, 2004.  On  April 10, 2004, the patient again had  a thickened loop, and exploratory laparotomy, small-bowel and colon  resection with jejunostomy.  She currently remains on heparin with  therapeutic levels.  We were asked to work up the patient for possible  hypercoagulable state - thrombophilia.  There is no family history of  strokes or heart disease, or hypercoagulability.  No family history of  pulmonary embolism, collagen vascular diseases.  The patient is an only  child. Mother deceased with what is defined as ruptured abdominal aortic  aneurysm.   The patient could possibly have an acquired condition or less likely  hypercoagulability.  The full work-up for this is very appropriate.  Unfortunately, she has had extensive clotting and surgical work-up, and some  of this work-up may not be as reliable.  We will proceed anyway with the  testing now, and at some future time, it can be repeated at the normal  physiologic state.  The recommendations are as follows:  1.  Factor V Leiden, prothrombin gene mutation, which should not be effected      by anticoagulation.  2.  Anticardiolipin antibodies.  3.  Antiphospholipids.  4.  Protein C and S.  5.  Antithrombin III.  6.  Homocystine.   Thank you very much for allowing [place] the opportunity to participate in the  care of Ms. Caul, will follow with you.      Huntley Dec   SW/MEDQ  D:  04/12/2004  T:  04/12/2004  Job:  [id number]   cc:   Venita Lick. Russella Dar, M.D. [facility]   Delano Metz, M.D.  [address]  [place]  [place] [address]  Fax: [phone]

## 2010-09-27 NOTE — Discharge Summary (Signed)
[name], [name] NO.:  [id number]   MEDICAL RECORD NO.:  [id number]          PATIENT TYPE:  INP   LOCATION:  5736                         FACILITY:  [facility]   PHYSICIAN:  Lorre Munroe., M.D.DATE OF BIRTH:  [birth year]   DATE OF ADMISSION:  04/08/2004  DATE OF DISCHARGE:  04/30/2004                                 DISCHARGE SUMMARY   HISTORY:  This is a 60 year old diabetic white female who had had some  weight loss and had recurring abdominal pain for a number of months. She was  admitted to the hospital by [facility] on April 05, 2005 because  of severe abdominal pain.  Please see H&P by [name] and Dr. Russella Dar for  more details of that admission.   The patient has hypertension. No history of any coronary artery disease.  It  was thought she had possibility of Crohn's disease but that had not been  documented.   Obviously, the patient was fairly sick on the day of admission and had a  high white count with tender abdomen.   HOSPITAL COURSE:  I saw the patient on that date along with the [facility]  [facility]. I thought that she was unstable, had a tender abdomen, very high  white count. A CT scan had been done, showing evidence of probable  compromised small bowel.  I advised immediate exploration of the abdomen and  the [facility] concurred with this, and patient consented.   I found that the patient had a small area of segmental infarction of the  ileum. There was good pulsation in the mesentery at that time. I felt that  with her instability, that simple resection and very close clinical follow-  up was the best course to follow. I did a small bowel resection and  anastomosis.  The next day, white count remained high (21,000). The patient  was stable and afebrile.  She was started on heparin and continued on NG  suction and very close follow-up.  On November 27, she remained afebrile,  heart rate 80-90, and felt better but said  she had wound pain.  She denied  deep abdominal pain.   However the next day, her white count persisted at about 20,000 and she  complained of more pain.  Abdomen was tender but soft.  I felt that ischemia  was a definite possibility and requested an arteriogram. This showed a clot  in the proximal and distal superior mesenteric artery with poor perfusion of  the ileum. I advised immediate reexploration and patient and family  concurred.  I found that there was considerable additional small bowel  infarction as well as extensive thrombosis of the superior mesenteric artery  and branches.  I did a thrombectomy and small bowel resection with  anastomosis.   The patient remained stable. We continued to heparinize her.  On April 10, 2004, I did a planned reexploration of the abdomen and found further,  considerable small bowel infarction.  I did an exploratory laparotomy with  small bowel and colon resection and jejunostomy.  I thought that the  remaining small bowel at that time was viable.  The patient was seen by  hematology and it was felt that she should continue on anticoagulation with  a short term of heparin, and on a long-term basis. They worked that out, and  will follow her up after hospitalization.   I took her back to the operating room on April 12, 2004 and found it  necessary to do further small bowel resection. At that time, did an  anastomosis of the jejunum to the colon.  Thereafter, the patient became  more stable, white count fell and she had no further evidence of mesenteric  ischemia. She was treated by intravenous nutrition, antibiotics and full  support. She gradually improved.  She was able to keep food without very  severe diarrhea. She did get hypokalemia and that had to be corrected. Blood  pressure was not a problem and those medications were held when she was  discharged.  Metformin, which she had previously been taking, was also held  when she was  discharged. She was given oral potassium supplements.  There  was an open wound which was treated for dressing changes; arrangements were  made for follow-up of that.   Arrangements were made for her to follow-up with me and with GI service on  an outpatient basis.  The patient was discharged on May 01, 2003.  Arrangements were made for short-term follow-up and home nursing care.   DIAGNOSES:  1.  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  2.  Type 2 diabetes.  3.  Hypertension.  4.  Obesity.  5.  Possible hypercoagulable state with history of smoking.   OPERATIONS:  Exploratory laparotomies with multiple small bowel resections,  eventual anastomosis of colon to jejunum.   CONDITION ON DISCHARGE:  Stabilized and improving.      WB/MEDQ  D:  08/26/2004  T:  08/26/2004  Job:  [id number]   cc:   Iva Boop, M.D. [facility]   Drue Second, MD  Fax: [phone]

## 2010-09-27 NOTE — Op Note (Signed)
[place]  Patient:    [name] [name], [name] [name] Visit Number: [id number] MRN: [id number]          Service Type: DSU Location: [phone] Attending Physician:  Fortino Sic Dictated by:   Marnee Spring. Wiliam Ke, M.D. Proc. Date: 01/07/01 Admit Date:  01/07/2001                             Operative Report  PREOPERATIVE DIAGNOSIS:  Symptomatic cholelithiasis.  POSTOPERATIVE DIAGNOSIS:  Symptomatic cholelithiasis.  OPERATION PERFORMED:  Laparoscopic cholecystectomy with intraoperative cholangiogram.  SURGEON:  Marnee Spring. Wiliam Ke, M.D.  ASSISTANT:  Mardene Celeste. Lurene Shadow, M.D.  ANESTHESIA:  Endotracheal by hospital.  DESCRIPTION OF PROCEDURE:  After general endotracheal anesthesia, skin of the abdomen was prepped and draped in usual manner.  The abdomen was entered through a supraumbilical incision.  A purse-string suture was placed, Hasson cannula was inserted.  Pneumoperitoneum was obtained.  The peritoneal surfaces appeared normal except for around the gallbladder.  The omentum was tightly adherent to the gallbladder.  A 10 mm and two 5 mm cannulae were placed in the usual fashion.  The gallbladder was dissected free, dissecting the port of hepatis, the cystic duct/gallbladder junction was identified.  A cholangiogram was performed with a Redic catheter using a fluoroscopic technique.   This was normal.  The cystic duct was triply clipped and cut leaving two clips proximally.  The cystic artery was triply clipped and cut leaving two clips proximally.  The gallbladder was removed from liver bed via electrocautery current and hemostasis was obtained with electrocautery current.  The gallbladder was removed in an Endopouch via the umbilical port.  The purse-string suture was tied, all the cannulae were removed, and the wounds were closed with subcuticular 4-0 Dexon and Steri-Strips applied.  Estimated blood loss was minimal.  The patient received no blood and left  the operating room in satisfactory condition after sponge and needle counts were verified. Dictated by:   Marnee Spring. Wiliam Ke, M.D. Attending Physician:  Fortino Sic DD:  01/07/01 TD:  01/07/01 Job: [id number]

## 2010-09-27 NOTE — Assessment & Plan Note (Signed)
[facility]                     [facility] OFFICE NOTE   NAME:[name] [name], [name] [name]                         MRN:          [id number]  DATE:02/25/2006                            DOB:          1950/09/21    CHIEF COMPLAINT:  Followup of small short bowel syndrome.   PROBLEMS:  1. 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  2. Bacterial overgrowth.  3. Abnormal liver function tests.  4. Vitamin D deficiency.  5. History of colon polyps, colonoscopy December 05, 2005, negative for      recurrent polyps.  6. Abdominal mass, subcutaneous, thought to be scar tissue, stable over      time.  7. Weight loss while on total parenteral nutrition improving with      bacterial overgrowth treatment.  8. Peripherally inserted central catheter line in place, left upper      extremity.  9. Intermittent dermatitis related to cleansing of the left upper      extremity.  10.Thrombophilia, unclear etiology on chronic Coumadin, managed by Dr.      Drue Second.  11.Internal hemorrhoids.  12.Prior cholecystectomy.  13.Allergies to TAPE, PENICILLIN, and IV CONTRAST, though she does      tolerate Augmentin.  14.History of diabetes mellitus, resolved after weight loss.  15.Prior obesity.  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  17.Osteopenia on DEXA July 2006.  18.Dental problems.   INTERVAL HISTORY:  She feels like she is doing well.  We have her at 146  pounds on this scale, but at home she has ranged between 157 and 161, so  overall, that is improved.  She feels her clothes are fitting and not loose  any more.  She is only taking Augmentin and not rotating with the Cipro.  She misunderstood.  Her other medications are Imodium 2 with meals, TPN 10  hours, Tums 3 a day, Coumadin 11 mg daily, Prilosec 20 mg b.i.d., vitamin D  50,000 units daily, ursodeoxycholic acid 300 mg  b.i.d.   DRUG ALLERGIES:  As listed above.   Her LFTs recently normalized, being close to normal.  On October 1 she did  have normal transaminases, as well as August 28.  They have her stable at  around 158 to 159 pounds.  She has not lost further.  Her platelet count was  147,000, which is slightly low.  Hemoglobin 11.2.   She has not yet been up to [facility] for her liver evaluation.  She has  that in a month.  She recently spent a week in [place], so diarrhea is not  keeping her in.  She is requesting a refill of clobetasol for her rash on  her arm.   PHYSICAL:  Weight as described above.  Pulse 74, blood pressure 131/77.  EYES:  Anicteric.  ABDOMEN:  Soft.  There is a baseball-sized mass or so in the subcutaneous  area below her lower midline scar.  Mobile, firm as before.  Nontender.  I  do not think it has changed.  No organomegaly or mass  otherwise.  Abdomen is  soft, slightly doughy.  EXTREMITIES:  No edema.  SKIN:  Somewhat dry.  To the lateral aspect of where her PICC is (banded and  looks fine), there is a papular erythematous rash with some scaliness.  There is a small area of that.   ASSESSMENT:  1. Short bowel syndrome on total parenteral nutrition, stable.  2. Bacterial overgrowth, improved overall.  3. Subcutaneous abdominal mass that I think is scar tissue from prior      surgery.  4. Abnormal liver function tests.  These have improved, need to look for      possible fatty liver with chronic total parenteral nutrition.   PLAN:  1. Keep followup at [facility] liver center.  2. See me in December.  3. Continue TPN protocol.  4. Refill clobetasol to be used intermittently for this rash.  It was      prescribed by the dermatologist before #60 g.  No refill prescribed.  5. Further plans pending clinical course and the above.  She will rotate      the Augmentin and the Cipro at this time.       Iva Boop, MD,FACG      [name]/MedQ  DD:  02/25/2006  DT:   02/26/2006  Job #:  [id number]   cc:   Danely C Weichert  Drue Second, MD  Lebron Conners, M.D.  [facility]

## 2010-09-27 NOTE — H&P (Signed)
[name] [name], [name] [name]                  ACCOUNT NO.:  [id number]   MEDICAL RECORD NO.:  [id number]          PATIENT TYPE:  INP   LOCATION:  0106                         FACILITY:  [facility]   PHYSICIAN:  Malcolm T. Russella Dar, M.D. [facility] OF BIRTH:  12/08/1950   DATE OF ADMISSION:  04/05/2004  DATE OF DISCHARGE:                                HISTORY & PHYSICAL   CHIEF COMPLAINT:  Severe abdominal pain.   HISTORY:  Vaughn is a pleasant 60 year old white female with history of adult-  onset diabetes mellitus, hypertension, tobacco use, and is status post  cholecystectomy per Dr. Wiliam Ke approximately three years ago.  She is  followed by Dr. Delano Metz at Ur[facility].  She was recently referred to  Dr. Leone Payor for GI evaluation.  She had initial onset of her symptoms in  June 2005 with focal left lower quadrant pain which was treated as  diverticulitis with Cipro and Flagyl with initial response.  However, after  the antibiotics were stopped, her symptoms recurred.  She took a total of 3  courses of Cipro and Flagyl and then had CT scan of the abdomen and pelvis  in October 2005 which showed a loop of thickened small-bowel with adjacent  mesenteric stranding most consistent with Crohn's.  She was then referred to  Dr. Leone Payor, seen March 15, 2004, and then underwent colonoscopy on  November 10 with finding of diverticulosis, multiple small polyps which were  removed.  She did have intubation of the ileum with no evidence of Crohn's.  Subsequent upper GI and small-bowel follow through done on November 14  showed marked decreased transit time and a few loops of small bowel with  decreased mucosal pattern raising the question of Crohn's, celiac disease,  etc.  IV __________ markers were done and were negative.  She was to be  scheduled for capsule endoscopy.  In the interim, she has been treated with  Levbid, Protonix, hydrocodone, and Phenergan and was started on prednisone  at 40 mg per day  about one week ago.   The patient says that her pain has gradually progressed over the past few  months, is now constant.  Her appetite has been decreased, and she has had  weight loss totalling 70 pounds since June 2005, though she admits some of  this has been intentional.  She has had nausea and vomiting daily over the  past two weeks, really has not been taking much p.o., but can keep liquids  down.  No fever documented, positive sweats.  her bowel movements  occasionally loose and have been irregular.  There is no melena or  hematochezia.  Apparently the patient was in the emergency room yesterday  with pain, received fluids and pain medication, and was discharged last  night.  She says her pain was unbearable, and she returned to the emergency  room with complaints of pain of 10/10.  She says, I don't think I can do  this one more day.   Laboratory studies in the ER showed a WBC of 23,000, hemoglobin 15,  hematocrit 44.9.  Potassium  4, BUN 13, creatinine 0.6.  Liver function  studies normal.  Lipase 16.   CT scan of the abdomen and pelvis shows abnormal small bowel in the mid  abdomen with evidence of pneumatoses intestinalis in the left loops and  moderate surrounding inflammatory changes, very worrisome for ischemia.  She  also has some evidence of stenosis and non-occlusive thrombus in the  proximal SMA.  She is admitted at this time with an acute abdomen for  surgical consultation and possible mesenteric arteriogram.   CURRENT MEDICATIONS:  1.  prednisone 40 p.o. daily.  2.  Lisinopril 10 daily.  3.  Claritin D 1 p.o. daily.  4.  Atenolol 100 daily.  5.  Metformin 500 daily.  6.  Prilosec 20 daily.   ALLERGIES:  PENICILLIN and CONTRAST DYE which caused pruritus.   PAST MEDICAL HISTORY:  As outlined above.   SOCIAL HISTORY:  The patient is married.  She is self employed.  She is a  smoker, no ETOH.   FAMILY HISTORY:  Positive for diabetes mellitus in her mother.    REVIEW OF SYSTEMS:  CARDIOVASCULAR:  Denies any chest pain or anginal  symptoms.  PULMONARY:  Negative cough, shortness of breath, sputum  production.  GENITOURINARY:  Negative for dysuria, urgency, or frequency.  She has had fatigue and weight loss as above.  GI:  As above.   PHYSICAL EXAMINATION:  GENERAL:  Well-developed white female, uncomfortable.  She is in pain with any movement.  Alert and oriented x 3.  VITAL SIGNS:  Temperature 97.8, blood pressure 115/73, pulse 69,  respirations 18.  HEENT:  Atraumatic and normocephalic.  EOMI.  PERRLA.  Sclerae anicteric.  CARDIOVASCULAR:  Regular rate and rhythm with S1 and S2.  There is no  murmur, rub, or gallop.  PULMONARY:  Clear to auscultation and percussion.  ABDOMEN:  Soft, quiet, no bowel sounds currently.  She is diffusely tender,  more markedly in the left mid quadrant.  She has rather diffuse rebound as  well.  There is no mass or hepatosplenomegaly.  Abdominal skin is quite  loose secondary to weight loss.  RECTAL:  Exam not repeated.  Negative for occult blood per ER MD.  EXTREMITIES:  Without clubbing, cyanosis, or edema.  Pulses are intact.  NEUROLOGIC:  Nonfocal.   IMPRESSION:  38.  A 60 year old white female with acute and subacute abdominal pain x 5      months. Workup now consistently showing an abnormal segment of mid small      bowel; rule out primary vascular disease with compromised bowel acutely.      Rule out Crohn's with a segment of compromised loops.  2.  Adult-onset diabetes mellitus.  3.  Hypertension.  4.  Smoker.  5.  Cholecystectomy.  6.  Leukocytosis in part secondary to steroids.   PLAN:  1.  The patient is admitted for pain control, volume repletion.  2.  She will be kept n.p.o.  3.  Obtain surgical consultation.  4.  Cover her with IV Cipro and Flagyl.  5.  She may need a mesenteric arteriogram.  I will defer this to Dr. Orson Slick      verus direct laparotomy.      AE/MEDQ  D:  04/05/2004  T:   04/05/2004  Job:  [id number]   cc:   Lorre Munroe., M.D.  Fax: [phone]   Delano Metz, M.D.  [address]  [place]  [place] [address]  Fax: [phone]

## 2010-09-27 NOTE — Assessment & Plan Note (Signed)
[facility]                           GASTROENTEROLOGY OFFICE NOTE   NAME:Koziol, ITZA MANIACI                         MRN:          [id number]  DATE:01/19/2006                            DOB:          [birth year]    Telephone conversation with Ouida Sills, RPH at [facility]  regarding manganese supplementation in her TPN.  On the advise of Dr.  Windell Moulding, [facility] we are going to discontinue the manganese.  A verbal order was  given.   Additional update.  A request for hepatology appointment at [facility] was placed.  This is regarding abnormal transaminase, question of fatty liver,  __________.  Dental care has been inquired about but as far as getting that,  that has been more difficult, that is not really available at our hospital  clinic here in [place], as best I can tell.  So we are working on that at  [facility].  I will followup with the patient by phone.                                   Iva Boop, MD,FACG   [name]/MedQ  DD:  01/19/2006  DT:  01/19/2006  Job #:  [id number]

## 2010-09-27 NOTE — Discharge Summary (Signed)
NAMEKIYA, ENO NO.:  [id number]   MEDICAL RECORD NO.:  [id number]          PATIENT TYPE:  INP   LOCATION:  0383                         [facility]AC[facility]   PHYSICIAN:  Iva Boop, M.D. LHCDATE OF BIRTH:  26-Apr-1951   DATE OF ADMISSION:  07/04/2004  DATE OF DISCHARGE:  07/11/2004                                 DISCHARGE SUMMARY   ADMISSION DIAGNOSES:  65.  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  2.  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  3.  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  4.  Weight loss secondary to above.  5.  Hypercoagulable state felt responsible for SMA occlusion, now on chronic      Coumadin.  6.  Left lower extremity edema, rule out deep venous thrombosis.  7.  Status post cholecystectomy.  8.  History of hypertension and adult onset diabetes mellitus, both inactive      currently due to recent surgery, weight loss, and malabsorption.   DISCHARGE DIAGNOSES:  69.  A 60 year old female with short bowel syndrome, status post SMA      occlusion and resection of large portion of small bowel in November      2005, now on cyclic T&A with improved nutritional stores and      electrolytes.  2.  Malnutrition secondary to above with marked hypoalbuminemia,      hypokalemia, hypomagnesemia, and iron deficiency.  3.  Hypercoagulable state on chronic Coumadin. Periodic lower extremity      edema with no evidence of DVT on Doppler studies.   PROCEDURE:  PICC line placement.   CONSULTATIONS:  Pharmacy and nutrition.   BRIEF HISTORY:  Shaylon is a pleasant 60 year old white female well known to  Dr. Leone Payor with history of short bowl syndrome occurring after a recent SMA  occlusion for which she was hospitalized in November 2005 through mid  December. She ultimately required three surgeries per Dr. Orson Slick due to  progressive infarction, the last of which was done on April 12, 2004. She  had small bowel resection and anastomosis of the jejunum to the ascending  colon. She has intact the most proximal part of her jejunum from the  ligament of Treitz. She has had difficulty with hypokalemia, hypomagnesemia,  and weight loss over the past. She states that she has been eating, is able  to eat solid food, but that despite eating three meals a day and consuming  one can of Glucerna a day, she has lost 9 pounds. She has been having nausea  which is exacerbated by her eating, though no vomiting, and says that  usually within one hour of a meal she will have loose stools. She has not  been having any abdominal pain. She had been given Phenergan for nausea, but  had not been taking it because it makes her sleepy. She had also been given  potassium supplements and magnesium supplements, but had not taken these  over the past week due to  severe nausea. She has been having three to four  bowel movements per day. Primary complaint, when evaluated in the office,  was fatigue. She also says she has been having some cramping in her hands  and a tingling feeling in her feet and had noted some swelling in the left  foot over the past couple of days. She is felt to have an underlying  hypercoagulable state responsible for SMA occlusion. She has been on  Coumadin. Apparently her last INR was high and she has been off  her  Coumadin for the past couple of days. She is admitted to the hospital for  initiation of T&A, and PICC line versus Port-A-Cath placement, and dietary  counseling.   LABORATORY STUDIES:  On July 07, 2004, WBC is 6.8, hemoglobin 10,  hematocrit 29.3, platelet count 231,000. On February 27th, hemoglobin was  9.1, hematocrit 27.1. Protime on admission was 25, INR 3.3. On July 11, 2004, protime was 20, INR 2.1. Potassium on admission was 2. BUN less than  1, potassium 0.6, albumin 1.8. She required  vigorous potassium replacement.  On July 11, 2004, potassium was 3.6, sodium 137, glucose 126, BUN 4,  creatinine 0.3, albumin 1.5.  Liver function studies on admission reveal AST  of 55, ALT 46. Follow up on July 08, 2004, AST 29, ALT 37, magnesium low  admission of 0.9 and this was corrected to 1.9.  Phosphorus was normal on  admission and 3 on discharge. Hemoglobin A1C on February 25th was low at  4.5. Lipid panel on February 24th showed cholesterol of 62, triglycerides  166, iron low at 35.  Prealbumin on admission low at 13.5 and follow up on  the 24th was 11.3, and follow up on July 11, 2004, was 7.9.   Lower extremity Doppler of the left leg was negative on February 24th and  lower extremity Doppler of the right leg was negative on March 2nd. EKG on  admission showed normal sinus rhythm. X-ray studies none.   HOSPITAL COURSE:  Brittanee was admitted, started on IV potassium replacement as  well as magnesium sulfate. She was started on two Imodiums before meals,  allowed solid diet, and was scheduled for PICC line placement with plans to  initiate T&A. It was felt, per Dr. Leone Payor, that she may require long-term  T&A and that she may benefit from Port-A-Cath placement. The patient was  hesitant to proceed with permanent access and asked if we could give her a  trial of PICC line placement and T&A prior to her making that decision. She  expressed the desire to try to maintain her nutrition without any  supplementation intravenously. She was seen by the dietitian during her  admission who gave her quite a bit of instruction regarding diet with short  bowel. We did have a PICC line placed and she was started on T&A. She  tolerated this without any difficulty. She did have some increase in her  glucose once on T&A. We converted her to cyclic T&A for home 15 hours per  day. In the interim, she continued to require potassium supplementation and magnesium supplementation. She did find the  antidiarrheals helpful. She did  have intermittent nausea as well and was responsive to Zofran. Once her  electrolytes were sufficiently corrected she felt quite a bit better, began  ambulating, and gaining strength. She had no complaints of abdominal pain.  We did have some difficulty with her anticoagulation once her nutritional  stores improved. She required  higher doses of Coumadin to maintain a  therapeutic INR. She was discharged to home on July 11, 2004, with plans to  follow up with Dr. Leone Payor in the office on Friday, March 10th at 10:15  a.m., to call for any problems in the interim. She is to get her protime  checked on July 13, 2004, and then as directed per Dr. Welton Flakes. [facility] was to supply her cyclic T&A 15 hours per day and she is to follow  dietary instructions per the nutritionist.   MEDICATIONS ON DISCHARGE:  1.  Coumadin 5 mg p.o. daily, which was an increase in her dose.  2.  Protonix 40 mg q.a.m.  3.  Zofran 4 mg q.6-8h. p.r.n. nausea and vomiting.  4.  Lomotil two p.o. a.c.  5.  Magnesium oxide 800 mg b.i.d.  6.  Claritin D as needed.  7.  Feosol liquid 10 mL t.i.d.  8.  Multivitamin daily.  9.  K-Dur 40 mEq daily.   She is to check CBGs at home as well.   CONDITION ON DISCHARGE:  Stable and improved. We have discussed referral to  a tertiary care center with the patient during this admission for a second  opinion regarding her short bowel and any other helpful management  suggestion. She was very open to this and this will be arranged on an  outpatient basis.      AE/MEDQ  D:  07/15/2004  T:  07/15/2004  Job:  [id number]   cc:   Lorre Munroe., M.D.  [address]  [place]  [place] [address]

## 2010-09-27 NOTE — Op Note (Signed)
[name] [name], [name] [name] NO.:  [id number]   MEDICAL RECORD NO.:  [id number]          PATIENT TYPE:  INP   LOCATION:  0106                         FACILITY:  [facility]   PHYSICIAN:  Lorre Munroe., M.D.DATE OF BIRTH:  [birth year]   DATE OF PROCEDURE:  04/05/2004  DATE OF DISCHARGE:                                 OPERATIVE REPORT   PREOPERATIVE DIAGNOSIS:  Acute abdomen, probable ischemic or infarcted  bowel.   POSTOPERATIVE DIAGNOSIS:  Small bowel ischemia with patchy segmental  infarction.   OPERATION:  Small bowel resection with anastomosis.   SURGEON:  Dr. Orson Slick   ASSISTANT:  Dr. Lindie Spruce   ANESTHESIA:  General.   DESCRIPTION OF PROCEDURE:  After the patient had induction of general  anesthesia and insertion of a Foley catheter and nasogastric tube and  routine preparation and draping of the abdomen, I made a midline incision  about equal distance above and below the umbilicus and entered the abdomen.  Slightly cloudy, slightly bad-smelling fluid was present.  I checked the  upper abdomen and found no evidence of any problem with the gallbladder,  liver, stomach, or duodenum.  The transverse colon, right colon, and sigmoid  colon were normal in appearance.  I pulled up the omentum which was adherent  to the small bowel in several locations and found a distal small bowel  segment which was quite diseased and as I dissected it up, there were patchy  areas of obvious full-thickness necrosis.  The bowel was not ruptured at any  point, however.  I then traced the bowel from the junction with the cecum  where it appeared pretty healthy all the way back to the ligament of Treitz  and all of the small bowel except for the infarcted segment looked normal.  I then transected the small intestine with a cutting stapler proximal and  distal to the abnormality and segmentally divided the mesentery between  Kelly clamps and ligated the vessels with 2-0 silks.  At a couple  of spots,  I saw some blood clot in small vessels in the mesentery.  I then placed the  ends of the bowel side-to-side after determining that they looked healthy  and well-perfused.  I should note that I could feel a good pulse in the base  of the mesentery.  I stapled the bowel together side-to-side near the ends  and checked inside for good hemostasis.  The mucosa was normal in  appearance.  I then closed the common hole that remained with a linear  stapler.  I reinforced the staple line at a couple of placed with sutures,  and I closed the mesenteric defect with interrupted 2-0 silk sutures.  I  then copiously irrigated the abdominal cavity and removed the irrigant.  Before closure, I again looked at the anastomosis in the bowel and felt that  it was viable and that the anastomoses were adequate.  Sponge, needle, and  instrument counts were correct.  I closed the fascia with running #1 PDS  suture again at each end and tying in the middle, and  I closed the skin with  staples after copiously irrigating the subcutaneous tissues.  The patient  was stable throughout the procedure.     Will  WB/MEDQ  D:  04/05/2004  T:  04/05/2004  Job:  [id number]

## 2010-09-27 NOTE — Assessment & Plan Note (Signed)
[facility]                     [facility] OFFICE NOTE   NAME:[name] [name], [name] [name]                         MRN:          [id number]  DATE:12/17/2005                            DOB:          October 18, 1950    PROBLEMS:  1.  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  On chronic TPN.  2.  Bacterial overgrowth problems.  3.  Abnormal LFTs.  4.  Vitamin D deficiency.  5.  History of colon polyps; colonoscopy December 05, 2005, negative for      recurrent polyps.  6.  Weight loss while on TPN, improving after more aggressive treatment of      bacterial overgrowth and control of diarrhea.  7.  Perpiherally inserted central catheter line in place, left upper      extremity.  8.  Thrombophilia, unclear etiology.  On chronic Coumadin managed by Dr.      Leafy Ro.  9.  Internal hemorrhoids.  10. Prior cholecystectomy.  11. ALLERGIES TO TAPE, PENICILLIN, AND IV CONTRAST, though she tolerates      Augmentin.  12. History of diabetes mellitus, resolved after weight loss.  13. Prior obesity.  14. Allergic rhinosinusitis.  15. Osteopenia.  16. Dental problems.   HISTORY:  She is here after her colonoscopy.  I am reviewing her recent  consultation/followup visit with Dr. Gerome Sam at [facility].  He started  her on every-week antibiotics alternating with Cipro and Augmentin 250  b.i.d. and 500 t.i.d., respectively.  Her diarrhea is much better and she  has gained from about 153 pounds up to 157 pounds since July 5.  She has  also had a mass in the right lower quadrant that he palpated and I palpated  subsequently that had noticed as a fullness previously.  She had talked to  him about dental problems and she does have dental fractures and caries, it  appears.  He has recommended referral to a hepatologist and possible biopsy  and some other recommendations regarding her TPN.  She is walking  and  swimming some to try to exercise.  She feels a little better since she has  gained some weight.   Othre recommendations he has made are consider using doxycycline or  metronidazole in addition to the other antibiotics; start Actigall, as well.  Doxycycline caused quite a bit of trouble with her Coumadin before.   Other recommendations have been increasing lipids and glucose in TPN to give  her about 250 calories more but we could wait to see how her weight improves  first before doing that.  Goal would be to stabilize weight at about 160 to  165 pounds.  She has increased frequency of snacking.  We can consider  decreasing the time on TPN to 9 hours total including the taper, but she  would need to have her nighttime urine checked to make sure she is not  spilling sugar when the TPN is running.  He also recommended discontinuing  manganese and there are possible neurologic issues related to that.  Her recent laboratory studies have shown persistently abnormal LFTs, though  on June 25, the alk phos was 212, then 161 on July 30, AST 77 to 48, ALT 110  to 54, albumin 3.4 to 3.3.  This is a decrease in these numbers but she  really has sort of bounced around over time, having had numbers of 232, 57,  73 in May.  There has not been a persistent trend that I can see.   Her TPN formula is currently 1500 cc with 1000 kilocalories a day, 85 g  amino acids, 120.5 g dextrose, 25 g lipid.  She has 140 mg of sodium, 140 mg  potassium, 18 mg calcium, 27.5 mg magnesium, 308 mEq acetate, 35 mg  phosphorus, multivitamin, trace elements, insulin.   MEDICATIONS:  1.  Coumadin 7.5 mg daily, adjusted by Dr. Park Breed as needed.  2.  Protonix 40 mg daily.  3.  Imodium two with meals.  4.  TPN 10 hours at night.  5.  Tums three a day.  6.  Cipro 250 mg b.i.d. one week.  7.  Augmentin 500 mg t.i.d. one week.  8.  Vitamin D 50,000 units daily.  (Vitamin D level has been in the high      range of low.)   9.  Actonel 35 mg weekly.  10. Zofran p.r.n.  11. Phenergan p.r.n.   PHYSICAL EXAMINATION:  GENERAL:  In no acute distress.  VITAL SIGNS:  Weight 157 pounds.  Pulse 76.  Blood pressure 104/67.  HEENT:  Eyes anicteric.  The teeth are in poor repair.  She is missing some.  Some of the molar areas have black spots and there is broken enamel.  ABDOMEN:  Midline scar, soft.  She has a firm mass just to the right of the  lower aspect of the scar and inferior to the umbilicus that I have palpated  before.  It is very firm and mobile.  It feels subcutaneous.  It is not  tender.  It does not seem changed in the last month to me.   ASSESSMENT:  As per problem list above.   I have reviewed her colonoscopy report.  She needs a repeat colonoscopy in  five years.   ADDITIONAL PLANS:  1.  Continue TPN.  I am not going to make any changes other than the      manganese right now but will communicate with [facility], Pharm D.      at [facility].  We will discuss further changes before any are      made.  2.  Referral to [facility] hepatologist for consideration for liver      biopsy.  3.  Hold off on any CT scanning at this point on this suspected scar.  4.  She has done well on weekly Cipro and Augmentin.  We are going to see if      she can go to every other week as she was on once-a-month antibiotics      before.  If she picks up too much diarrhea with that, we will go back to      weekly.  5.  Start ursodeoxycholic acid 300 mg b.i.d., trying to titrate up to t.i.d.      Watch for diarrhea on that.  6.  Schedule followup within the next two months.  I will definitely follow      up with her by phone sooner.  7.  Schedule appointment with Dr.  Kulinski of the dental clinic regarding      her dental problems.  8.  Continue followup with Dr. Park Breed.                                   Iva Boop, MD, [facility]   [name]/MedQ  DD:  12/17/2005  DT:  12/17/2005  Job #:  [id number]  cc:    Lebron Conners, MD  Leafy Ro, M.D.  Charlynne Pander, DDS  Chiquita Loth, M.D.

## 2010-09-27 NOTE — Op Note (Signed)
NAMETISHA, CLINE NO.:  [id number]   MEDICAL RECORD NO.:  [id number]          PATIENT TYPE:  INP   LOCATION:  2101                         FACILITY:  [facility]   PHYSICIAN:  Lorre Munroe., M.D.DATE OF BIRTH:  1950-10-26   DATE OF PROCEDURE:  04/10/2004  DATE OF DISCHARGE:                                 OPERATIVE REPORT   PREOPERATIVE DIAGNOSIS:  Small bowel ischemia.   POSTOPERATIVE DIAGNOSIS:  Small bowel infarction.   PROCEDURE:  Resection of most of the remaining small bowel and the cecum  with diverting jejunostomy.   SURGEON:  Lebron Conners, M.D.   ANESTHESIA:  General.   DESCRIPTION OF PROCEDURE:  After the patient was monitored and anesthetized,  and had routine preparation and draping of the abdomen, I removed the  staples which were present and a fascial suture which was present.   I entered the abdomen and I noted dark fluid around the small bowel.  I  lifted up the omentum and exposed the small bowel, found massive infarction  of most of the remaining ileum and jejunum.  Cecum appeared to be viable, as  did the ascending, transverse and descending colon.  The duodenum looked  good, however, there was some duskiness of the proximal jejunum.  I examined  the area of dissection of the superior mesenteric artery and found that it  had excellent pulse in it with an extremely strong flow of Doppler.  There  was strong, pulsatile flow in the mesentery of the ascending colon,  transverse colon and proximal small bowel.   I made the decision to resect the obviously dead bowel and bring out a  stoma.  I divided the bowel proximal to the obviously infarcted area with  the cutting stapler.  I then dissected up the right colon and divided bowel  in the mid-ascending colon using the cutting staple.  I segmentally divided  the mesenteric vessels that intervened, and ligated them with 2-0 silk.  I  then inspected the area of the staple line on the  ascending colon, found  some pulsatile arterial flow present there and it looked intact.  The same  was true for the proximal side of division.  After doing all the division, I again listened with the Doppler and found  good, pulsatile flow in the vessels of the distal mesentery.   I then made a hole in the abdominal wall on the left side,which seemed to be  the most convenient spot to bring out the stoma.  I took out a good sized  skin button of subcutaneous tissue and made a cruciate incision in the  fascia, divided the posterior sheath, dilated the tract and pulled the  jejunum up and held it in place with Allis clamps.  After irrigating the  abdominal cavity, and removing the irrigant, and getting a correct sponge,  needle and instrument count, I closed the fascia with running #1 PDS and  loosely closed the skin, packing the interstitices with moist gauze.  I  matured the jejunostomy with 3-0 Vicryl sutures, taking bites through the  skin, then seromuscular layer of  the jejunum and then the full-thickness of the jejunum to make a slight  button, although the edema of the bowel prevented making a good button.  The  mucosa was somewhat edematous but appeared to be viable.  I then applied a  stoma appliance.  We applied a bulky bandage and sent the patient to PACU in  stable condition.      Will   WB/MEDQ  D:  04/10/2004  T:  04/10/2004  Job:  [id number]

## 2010-10-11 ENCOUNTER — Other Ambulatory Visit: Payer: Self-pay | Admitting: Oncology

## 2010-10-11 ENCOUNTER — Encounter (HOSPITAL_BASED_OUTPATIENT_CLINIC_OR_DEPARTMENT_OTHER): Payer: BC Managed Care – PPO | Admitting: Oncology

## 2010-10-11 DIAGNOSIS — K551 Chronic vascular disorders of intestine: Secondary | ICD-10-CM

## 2010-10-11 DIAGNOSIS — D638 Anemia in other chronic diseases classified elsewhere: Secondary | ICD-10-CM

## 2010-10-11 LAB — CBC WITH DIFFERENTIAL/PLATELET
BASO%: 0.6 % (ref 0.0–2.0)
EOS%: 8 % — ABNORMAL HIGH (ref 0.0–7.0)
HCT: 33 % — ABNORMAL LOW (ref 34.8–46.6)
MCH: 28 pg (ref 25.1–34.0)
MCHC: 30.9 g/dL — ABNORMAL LOW (ref 31.5–36.0)
MONO#: 0.8 10*3/uL (ref 0.1–0.9)
NEUT%: 36.1 % — ABNORMAL LOW (ref 38.4–76.8)
RDW: 16.3 % — ABNORMAL HIGH (ref 11.2–14.5)
WBC: 5 10*3/uL (ref 3.9–10.3)
lymph#: 2 10*3/uL (ref 0.9–3.3)
nRBC: 0 % (ref 0–0)

## 2010-10-11 LAB — PROTIME-INR: Protime: 21.6 Seconds — ABNORMAL HIGH (ref 10.6–13.4)

## 2010-10-14 ENCOUNTER — Encounter: Payer: Self-pay | Admitting: Internal Medicine

## 2010-10-18 ENCOUNTER — Encounter (HOSPITAL_BASED_OUTPATIENT_CLINIC_OR_DEPARTMENT_OTHER): Payer: BC Managed Care – PPO | Admitting: Oncology

## 2010-10-18 ENCOUNTER — Encounter: Payer: Self-pay | Admitting: Family Medicine

## 2010-10-18 ENCOUNTER — Ambulatory Visit (INDEPENDENT_AMBULATORY_CARE_PROVIDER_SITE_OTHER): Payer: BC Managed Care – PPO | Admitting: Family Medicine

## 2010-10-18 ENCOUNTER — Ambulatory Visit: Payer: BC Managed Care – PPO | Admitting: Family Medicine

## 2010-10-18 ENCOUNTER — Other Ambulatory Visit: Payer: Self-pay | Admitting: Physician Assistant

## 2010-10-18 DIAGNOSIS — K551 Chronic vascular disorders of intestine: Secondary | ICD-10-CM

## 2010-10-18 DIAGNOSIS — Z7901 Long term (current) use of anticoagulants: Secondary | ICD-10-CM

## 2010-10-18 DIAGNOSIS — D638 Anemia in other chronic diseases classified elsewhere: Secondary | ICD-10-CM

## 2010-10-18 DIAGNOSIS — Z5181 Encounter for therapeutic drug level monitoring: Secondary | ICD-10-CM

## 2010-10-18 DIAGNOSIS — J40 Bronchitis, not specified as acute or chronic: Secondary | ICD-10-CM

## 2010-10-18 LAB — PROTIME-INR
INR: 2.7 (ref 2.00–3.50)
Protime: 32.4 Seconds — ABNORMAL HIGH (ref 10.6–13.4)

## 2010-10-18 MED ORDER — BENZONATATE 200 MG PO CAPS: 200.0000 mg | ORAL_CAPSULE | Freq: Three times a day (TID) | ORAL | Status: AC | PRN

## 2010-10-18 MED ORDER — CLARITHROMYCIN ER 500 MG PO TB24: 1000.0000 mg | ORAL_TABLET | Freq: Every day | ORAL | Status: AC

## 2010-10-18 NOTE — Progress Notes (Signed)
  Subjective:    Patient ID: [name] [name], female    DOB: [birth year], 60 y.o.   MRN: [id number]  HPI Cough- started 1 week ago, husband had similar sxs prior.  Has been using mucinex.  Cough is now productive of yellow sputum.  Having low grade fevers at night.  Denies nasal congestion.  Ear pressure but no pain.     Review of Systems For ROS see HPI     Objective:   Physical Exam  Constitutional: She appears well-developed and well-nourished. No distress.  HENT:  Head: Normocephalic and atraumatic.       TMs normal bilaterally Mild nasal congestion Throat w/out erythema, edema, or exudate  Eyes: Conjunctivae and EOM are normal. Pupils are equal, round, and reactive to light.  Neck: Normal range of motion. Neck supple.  Cardiovascular: Normal rate, regular rhythm, normal heart sounds and intact distal pulses.   No murmur heard. Pulmonary/Chest: Effort normal and breath sounds normal. No respiratory distress. She has no wheezes.       + hacking cough  Lymphadenopathy:    She has no cervical adenopathy.          Assessment & Plan:

## 2010-10-18 NOTE — Patient Instructions (Signed)
Take the Biaxin as directed for bronchitis Is your cough changes or worsens- call me! Continue the Mucinex Drink plenty of fluids Take the Tessalon as needed for cough Rest as you are able Have a great trip!!!

## 2010-10-18 NOTE — Assessment & Plan Note (Signed)
Pt's sxs and PE consistent w/ bronchitis.  Start abx.  Reviewed supportive care and red flags that should prompt return.  Pt expressed understanding and is in agreement w/ plan.

## 2010-10-20 ENCOUNTER — Other Ambulatory Visit: Payer: Self-pay | Admitting: Gastroenterology

## 2010-10-20 ENCOUNTER — Other Ambulatory Visit: Payer: Self-pay | Admitting: Internal Medicine

## 2010-10-21 NOTE — Telephone Encounter (Signed)
Patient Scheduled an appointment for 11/20/10. Medications refilled for a months supply until appointment.

## 2010-10-25 ENCOUNTER — Encounter (HOSPITAL_BASED_OUTPATIENT_CLINIC_OR_DEPARTMENT_OTHER): Payer: BC Managed Care – PPO | Admitting: Oncology

## 2010-10-25 ENCOUNTER — Other Ambulatory Visit: Payer: Self-pay | Admitting: Physician Assistant

## 2010-10-25 DIAGNOSIS — D638 Anemia in other chronic diseases classified elsewhere: Secondary | ICD-10-CM

## 2010-10-25 DIAGNOSIS — Z7901 Long term (current) use of anticoagulants: Secondary | ICD-10-CM

## 2010-10-25 DIAGNOSIS — K551 Chronic vascular disorders of intestine: Secondary | ICD-10-CM

## 2010-10-25 DIAGNOSIS — I749 Embolism and thrombosis of unspecified artery: Secondary | ICD-10-CM

## 2010-10-25 LAB — CBC WITH DIFFERENTIAL/PLATELET
BASO%: 0.8 % (ref 0.0–2.0)
EOS%: 12.2 % — ABNORMAL HIGH (ref 0.0–7.0)
HCT: 30.2 % — ABNORMAL LOW (ref 34.8–46.6)
MCH: 29.1 pg (ref 25.1–34.0)
MCHC: 33 g/dL (ref 31.5–36.0)
NEUT%: 42.2 % (ref 38.4–76.8)
RDW: 16.7 % — ABNORMAL HIGH (ref 11.2–14.5)
lymph#: 1.8 10*3/uL (ref 0.9–3.3)

## 2010-10-25 LAB — PROTIME-INR: INR: 2.8 (ref 2.00–3.50)

## 2010-11-08 ENCOUNTER — Encounter (HOSPITAL_BASED_OUTPATIENT_CLINIC_OR_DEPARTMENT_OTHER): Payer: BC Managed Care – PPO | Admitting: Oncology

## 2010-11-08 ENCOUNTER — Other Ambulatory Visit: Payer: Self-pay | Admitting: Oncology

## 2010-11-08 DIAGNOSIS — D638 Anemia in other chronic diseases classified elsewhere: Secondary | ICD-10-CM

## 2010-11-08 DIAGNOSIS — K551 Chronic vascular disorders of intestine: Secondary | ICD-10-CM

## 2010-11-08 DIAGNOSIS — Z5181 Encounter for therapeutic drug level monitoring: Secondary | ICD-10-CM

## 2010-11-08 DIAGNOSIS — I749 Embolism and thrombosis of unspecified artery: Secondary | ICD-10-CM

## 2010-11-08 LAB — CBC WITH DIFFERENTIAL/PLATELET
BASO%: 1.1 % (ref 0.0–2.0)
HCT: 31.5 % — ABNORMAL LOW (ref 34.8–46.6)
MCHC: 31.7 g/dL (ref 31.5–36.0)
MONO#: 0.5 10*3/uL (ref 0.1–0.9)
NEUT%: 45 % (ref 38.4–76.8)
WBC: 5.6 10*3/uL (ref 3.9–10.3)
lymph#: 2 10*3/uL (ref 0.9–3.3)
nRBC: 0 % (ref 0–0)

## 2010-11-08 LAB — PROTIME-INR: INR: 3.2 (ref 2.00–3.50)

## 2010-11-18 ENCOUNTER — Encounter: Payer: Self-pay | Admitting: Internal Medicine

## 2010-11-20 ENCOUNTER — Encounter: Payer: Self-pay | Admitting: Internal Medicine

## 2010-11-20 ENCOUNTER — Ambulatory Visit (INDEPENDENT_AMBULATORY_CARE_PROVIDER_SITE_OTHER): Payer: BC Managed Care – PPO | Admitting: Internal Medicine

## 2010-11-20 DIAGNOSIS — D689 Coagulation defect, unspecified: Secondary | ICD-10-CM

## 2010-11-20 DIAGNOSIS — K912 Postsurgical malabsorption, not elsewhere classified: Secondary | ICD-10-CM

## 2010-11-20 DIAGNOSIS — K089 Disorder of teeth and supporting structures, unspecified: Secondary | ICD-10-CM | POA: Insufficient documentation

## 2010-11-20 DIAGNOSIS — D638 Anemia in other chronic diseases classified elsewhere: Secondary | ICD-10-CM

## 2010-11-20 DIAGNOSIS — K6389 Other specified diseases of intestine: Secondary | ICD-10-CM

## 2010-11-20 DIAGNOSIS — K219 Gastro-esophageal reflux disease without esophagitis: Secondary | ICD-10-CM

## 2010-11-20 DIAGNOSIS — R748 Abnormal levels of other serum enzymes: Secondary | ICD-10-CM

## 2010-11-20 DIAGNOSIS — D6859 Other primary thrombophilia: Secondary | ICD-10-CM

## 2010-11-20 DIAGNOSIS — R7402 Elevation of levels of lactic acid dehydrogenase (LDH): Secondary | ICD-10-CM

## 2010-11-20 MED ORDER — URSODIOL 300 MG PO CAPS: 300.0000 mg | ORAL_CAPSULE | Freq: Two times a day (BID) | ORAL | Status: DC

## 2010-11-20 MED ORDER — OMEPRAZOLE 40 MG PO CPDR: 40.0000 mg | DELAYED_RELEASE_CAPSULE | Freq: Two times a day (BID) | ORAL | Status: DC

## 2010-11-20 MED ORDER — VITAMIN D (ERGOCALCIFEROL) 1.25 MG (50000 UNIT) PO CAPS: ORAL_CAPSULE | ORAL | Status: DC

## 2010-11-20 MED ORDER — RIFAXIMIN 550 MG PO TABS: 550.0000 mg | ORAL_TABLET | Freq: Two times a day (BID) | ORAL | Status: DC

## 2010-11-20 MED ORDER — CIPROFLOXACIN HCL 500 MG PO TABS: 500.0000 mg | ORAL_TABLET | Freq: Two times a day (BID) | ORAL | Status: DC

## 2010-11-20 NOTE — Progress Notes (Signed)
  Subjective:    Patient ID: [name] [name], female    DOB: 1951/04/02, 60 y.o.   MRN: [id number]  HPI 60 year old white woman with short bowel syndrome on home TPN here for routine followup without any specific complaints overall. She feels like she has been doing well. Her weight has been overall stable and we reviewed that today. Gas with cipro - horrible Watches diet carefully and gets by with less cipro Antibiotics - none since December other than a Zpak she thinks she has been ok Otherwise ok Energy ok He was not able to qualify for dental care at the dental school but is gradually having dental extractions with an eventual plan for   Review of Systems As per history of present illness    Objective:   Physical Exam Well-developed well-nourished slightly thin appearing white woman no acute distress Oral cavity shows some remaining teeth in poor repair overall. Eyes anicteric Lungs clear Heart S1 and S2 no rubs murmurs or gallops The abdomen is soft and nontender there is fullness in the right lower quadrant as always no other organomegaly or mass There is a PICC line in the left upper treadmill he She is awake, alert, and oriented with a normal mood and affect.       Assessment & Plan:

## 2010-11-20 NOTE — Assessment & Plan Note (Signed)
Chronic fluctuation is noted. There's been no progressive rise. She has follow up with [facility] later this year she tells me.

## 2010-11-20 NOTE — Assessment & Plan Note (Signed)
This is a long-standing issue with fluctuating numbers over time. So far she hasn't had any clear significant liver damage. This will be monitored with routine labs through home health as always.

## 2010-11-20 NOTE — Assessment & Plan Note (Signed)
Weight is stable she seems to be tolerating this well. Some fluctuations in blood tests at times but no overt liver disease thus far, fortunately. Continue home TPN. Plan for routine repeat visit in about 6 months and continue followup with her other physicians as well.

## 2010-11-20 NOTE — Assessment & Plan Note (Signed)
Currently to be cycling ciprofloxacin and Xifaxan. She has stopped using the antibiotics over the past several months and says her symptoms are relatively controlled. I think it, she should continue these anyway as there are other potential deleterious manifestations of her bacterial overgrowth syndrome including a link to progression in liver disease in the setting of TPN. Will double check this and call her to advise.

## 2010-11-20 NOTE — Patient Instructions (Addendum)
Your medications were refilled today. I will look into the bacterial overgrowth treatment and how to modify that though I think you should be taking something at least monthly for protection. Iva Boop, MD, [facility]

## 2010-11-20 NOTE — Assessment & Plan Note (Signed)
She is on twice a day PPI. Neither she nor I can remember at this point why we went to twice daily. Will prescribe that but see if she can reduce the long PPI tablet daily at this time. She was instructed to taper the second dose and see how she does symptomatically.

## 2010-11-20 NOTE — Assessment & Plan Note (Addendum)
Followed by Dr. Welton Flakes

## 2010-12-05 ENCOUNTER — Encounter (HOSPITAL_BASED_OUTPATIENT_CLINIC_OR_DEPARTMENT_OTHER): Payer: BC Managed Care – PPO | Admitting: Oncology

## 2010-12-05 ENCOUNTER — Other Ambulatory Visit: Payer: Self-pay | Admitting: Physician Assistant

## 2010-12-05 DIAGNOSIS — D638 Anemia in other chronic diseases classified elsewhere: Secondary | ICD-10-CM

## 2010-12-05 DIAGNOSIS — K551 Chronic vascular disorders of intestine: Secondary | ICD-10-CM

## 2010-12-05 DIAGNOSIS — Z7901 Long term (current) use of anticoagulants: Secondary | ICD-10-CM

## 2010-12-05 DIAGNOSIS — I749 Embolism and thrombosis of unspecified artery: Secondary | ICD-10-CM

## 2010-12-05 LAB — CBC WITH DIFFERENTIAL/PLATELET
BASO%: 0.9 % (ref 0.0–2.0)
EOS%: 8.8 % — ABNORMAL HIGH (ref 0.0–7.0)
HCT: 31.1 % — ABNORMAL LOW (ref 34.8–46.6)
LYMPH%: 40.5 % (ref 14.0–49.7)
MCH: 28.2 pg (ref 25.1–34.0)
MCHC: 32.2 g/dL (ref 31.5–36.0)
MCV: 87.9 fL (ref 79.5–101.0)
MONO%: 11.1 % (ref 0.0–14.0)
NEUT%: 38.7 % (ref 38.4–76.8)
Platelets: 173 10*3/uL (ref 145–400)
RBC: 3.54 10*6/uL — ABNORMAL LOW (ref 3.70–5.45)

## 2010-12-05 LAB — PROTIME-INR

## 2010-12-21 ENCOUNTER — Other Ambulatory Visit: Payer: Self-pay | Admitting: Internal Medicine

## 2010-12-23 ENCOUNTER — Telehealth: Payer: Self-pay

## 2010-12-23 NOTE — Telephone Encounter (Signed)
Message copied by Annett Fabian on Mon Dec 23, 2010  3:49 PM ------      Message from: Iva Boop      Created: Sun Dec 22, 2010  4:56 PM      Regarding: is she taking her antibiotics       Cannot remember if i/we called her before but do think it is important for her to take her cyclic antibiotics to prevent the bacterial overgrowth and liver problems            See last assessment and plan

## 2010-12-23 NOTE — Telephone Encounter (Signed)
I spoke with the patient she is advised of xifaxan and cipro recommendations.  She is requesting a refill on her Vitamin D.  Order was sent for Vitamin D 50,000 units alternating with 100,000u daily.  Should this be refilled?  Do we need labs?  Please advise

## 2010-12-23 NOTE — Telephone Encounter (Signed)
Do you want to refill Drisdol [id number] for this patient?

## 2010-12-24 NOTE — Telephone Encounter (Signed)
I have contacted [facility] they will add the lab to her next lab draw.  She is advised that the Vitamin D has been refilled.

## 2010-12-24 NOTE — Telephone Encounter (Signed)
Ok 1) refill vitamin D as written - she is on high doses due to short bowel 2) Have home care team do 25 hydroxy vit D level with next routine labs

## 2011-01-02 ENCOUNTER — Other Ambulatory Visit: Payer: Self-pay | Admitting: Physician Assistant

## 2011-01-02 ENCOUNTER — Encounter (HOSPITAL_BASED_OUTPATIENT_CLINIC_OR_DEPARTMENT_OTHER): Payer: BC Managed Care – PPO | Admitting: Oncology

## 2011-01-02 DIAGNOSIS — K551 Chronic vascular disorders of intestine: Secondary | ICD-10-CM

## 2011-01-02 DIAGNOSIS — Z7901 Long term (current) use of anticoagulants: Secondary | ICD-10-CM

## 2011-01-02 DIAGNOSIS — D638 Anemia in other chronic diseases classified elsewhere: Secondary | ICD-10-CM

## 2011-01-02 DIAGNOSIS — I749 Embolism and thrombosis of unspecified artery: Secondary | ICD-10-CM

## 2011-01-02 LAB — CBC WITH DIFFERENTIAL/PLATELET
Basophils Absolute: 0.1 10*3/uL (ref 0.0–0.1)
EOS%: 5.6 % (ref 0.0–7.0)
HCT: 32.3 % — ABNORMAL LOW (ref 34.8–46.6)
HGB: 10.1 g/dL — ABNORMAL LOW (ref 11.6–15.9)
LYMPH%: 30.3 % (ref 14.0–49.7)
MCH: 27.2 pg (ref 25.1–34.0)
MCV: 87.1 fL (ref 79.5–101.0)
NEUT%: 52.7 % (ref 38.4–76.8)
Platelets: 185 10*3/uL (ref 145–400)
lymph#: 2 10*3/uL (ref 0.9–3.3)

## 2011-01-02 LAB — PROTIME-INR: INR: 4.6 — ABNORMAL HIGH (ref 2.00–3.50)

## 2011-01-10 ENCOUNTER — Encounter (HOSPITAL_BASED_OUTPATIENT_CLINIC_OR_DEPARTMENT_OTHER): Payer: BC Managed Care – PPO | Admitting: Oncology

## 2011-01-10 ENCOUNTER — Other Ambulatory Visit: Payer: Self-pay | Admitting: Oncology

## 2011-01-10 DIAGNOSIS — Z7901 Long term (current) use of anticoagulants: Secondary | ICD-10-CM

## 2011-01-10 DIAGNOSIS — I749 Embolism and thrombosis of unspecified artery: Secondary | ICD-10-CM

## 2011-01-10 DIAGNOSIS — Z5181 Encounter for therapeutic drug level monitoring: Secondary | ICD-10-CM

## 2011-01-10 LAB — PROTIME-INR: Protime: 36 Seconds — ABNORMAL HIGH (ref 10.6–13.4)

## 2011-01-17 ENCOUNTER — Ambulatory Visit (INDEPENDENT_AMBULATORY_CARE_PROVIDER_SITE_OTHER): Payer: BC Managed Care – PPO | Admitting: Family Medicine

## 2011-01-17 ENCOUNTER — Telehealth: Payer: Self-pay

## 2011-01-17 ENCOUNTER — Encounter: Payer: Self-pay | Admitting: Family Medicine

## 2011-01-17 ENCOUNTER — Other Ambulatory Visit: Payer: Self-pay | Admitting: Family Medicine

## 2011-01-17 ENCOUNTER — Ambulatory Visit (HOSPITAL_BASED_OUTPATIENT_CLINIC_OR_DEPARTMENT_OTHER)
Admission: RE | Admit: 2011-01-17 | Discharge: 2011-01-17 | Disposition: A | Discharge status: Home / Self Care | Payer: BC Managed Care – PPO | Source: Ambulatory Visit | Attending: Family Medicine | Admitting: Family Medicine

## 2011-01-17 VITALS — BP 108/64 | HR 82 | Temp 98.7°F | Wt 150.0 lb

## 2011-01-17 DIAGNOSIS — R059 Cough, unspecified: Secondary | ICD-10-CM

## 2011-01-17 DIAGNOSIS — R0602 Shortness of breath: Secondary | ICD-10-CM | POA: Insufficient documentation

## 2011-01-17 DIAGNOSIS — J40 Bronchitis, not specified as acute or chronic: Secondary | ICD-10-CM

## 2011-01-17 DIAGNOSIS — R05 Cough: Secondary | ICD-10-CM

## 2011-01-17 DIAGNOSIS — R9389 Abnormal findings on diagnostic imaging of other specified body structures: Secondary | ICD-10-CM

## 2011-01-17 DIAGNOSIS — R918 Other nonspecific abnormal finding of lung field: Secondary | ICD-10-CM | POA: Insufficient documentation

## 2011-01-17 MED ORDER — MOXIFLOXACIN HCL IN NACL 400 MG/250ML IV SOLN: 400.0000 mg | INTRAVENOUS | Status: DC

## 2011-01-17 MED ORDER — ALBUTEROL SULFATE (5 MG/ML) 0.5% IN NEBU
2.5000 mg | INHALATION_SOLUTION | Freq: Once | RESPIRATORY_TRACT | Status: AC
  Administered 2011-01-17: 2.5 mg via RESPIRATORY_TRACT

## 2011-01-17 MED ORDER — BUDESONIDE-FORMOTEROL FUMARATE 160-4.5 MCG/ACT IN AERO: 2.0000 | INHALATION_SPRAY | Freq: Two times a day (BID) | RESPIRATORY_TRACT | Status: DC

## 2011-01-17 MED ORDER — AZITHROMYCIN 250 MG PO TABS: ORAL_TABLET | ORAL | Status: AC

## 2011-01-17 MED ORDER — ALBUTEROL SULFATE HFA 108 (90 BASE) MCG/ACT IN AERS: INHALATION_SPRAY | RESPIRATORY_TRACT | Status: DC

## 2011-01-17 NOTE — Telephone Encounter (Signed)
Discussed with patient and she voiced understanding--order put in and Faxed referral information to [facility]     KP

## 2011-01-17 NOTE — Telephone Encounter (Signed)
Message copied by Arnette Norris on Fri Jan 17, 2011  2:38 PM ------      Message from: Lelon Perla      Created: Fri Jan 17, 2011  1:46 PM       Non specific abnormality on xray      We need to get CT chest to evaluate it further--- we may need blood work done stat---BMP      I'm also going to change the abx to IV avelox 400mg   Qd for 10 days--pt has [facility].

## 2011-01-17 NOTE — Progress Notes (Signed)
Addended by: Arnette Norris on: 01/17/2011 01:55 PM   Modules accepted: Orders

## 2011-01-17 NOTE — Progress Notes (Signed)
  Subjective:     [name] [name] is a 60 y.o. female here for evaluation of a cough. Onset of symptoms was 7 days ago. Symptoms have been gradually worsening since that time. The cough is productive of clear sputum and is aggravated by exercise. Associated symptoms include: chills, fever and sputum production. Patient does not have a history of asthma. Patient does not have a history of environmental allergens. Patient has not traveled recently. Patient does have a history of smoking. Patient has not had a previous chest x-ray. Patient has not had a PPD done.  The following portions of the patient's history were reviewed and updated as appropriate: allergies, current medications, past family history, past medical history, past social history, past surgical history and problem list.  Review of Systems Pertinent items are noted in HPI.    Objective:    Oxygen saturation 94% on room air BP 108/64  Pulse 82  Temp(Src) 98.7 F (37.1 C) (Oral)  Wt 150 lb (68.04 kg)  SpO2 94% General appearance: alert, cooperative, appears stated age and no distress Ears: normal TM's and external ear canals both ears Nose: Nares normal. Septum midline. Mucosa normal. No drainage or sinus tenderness. Throat: lips, mucosa, and tongue normal; teeth and gums normal Neck: no adenopathy and thyroid not enlarged, symmetric, no tenderness/mass/nodules Lungs: diminished breath sounds bilaterally Lymph nodes: Cervical, supraclavicular, and axillary nodes normal.    Assessment:    Acute Bronchitis --r/o pneumonia--will arrange for IV abx via pic line if necessary   Plan:    Antibiotics per medication orders. Antitussives per medication orders. Avoid exposure to tobacco smoke and fumes. B-agonist inhaler. Call if shortness of breath worsens, blood in sputum, change in character of cough, development of fever or chills, inability to maintain nutrition and hydration. Avoid exposure to tobacco smoke and fumes. Chest  x-ray.

## 2011-01-17 NOTE — Patient Instructions (Signed)
Bronchitis Bronchitis is the body's way of reacting to injury and/or infection (inflammation) of the bronchi. Bronchi are the air tubes that extend from the windpipe into the lungs. If the inflammation becomes severe, it may cause shortness of breath.  CAUSES Inflammation may be caused by:  A virus.   Germs (bacteria).   Dust.   Allergens.   Pollutants and many other irritants.  The cells lining the bronchial tree are covered with tiny hairs (cilia). These constantly beat upward, away from the lungs, toward the mouth. This keeps the lungs free of pollutants. When these cells become too irritated and are unable to do their job, mucus begins to develop. This causes the characteristic cough of bronchitis. The cough clears the lungs when the cilia are unable to do their job. Without either of these protective mechanisms, the mucus would settle in the lungs. Then you would develop pneumonia. Smoking is a common cause of bronchitis and can contribute to pneumonia. Stopping this habit is the single most important thing you can do to help yourself. TREATMENT  Your caregiver may prescribe an antibiotic if the cough is caused by bacteria. Also, medicines that open up your airways make it easier to breathe. Your caregiver may also recommend or prescribe an expectorant. It will loosen the mucus to be coughed up. Only take over-the-counter or prescription medicines for pain, discomfort, or fever as directed by your caregiver.   Removing whatever causes the problem (smoking, for example) is critical to preventing the problem from getting worse.   Cough suppressants may be prescribed for relief of cough symptoms.   Inhaled medicines may be prescribed to help with symptoms now and to help prevent problems from returning.   For those with recurrent (chronic) bronchitis, there may be a need for steroid medicines.  SEEK IMMEDIATE MEDICAL CARE IF:  During treatment, you develop more pus-like mucus  (purulent sputum).   You or your child has an oral temperature above 100.4, not controlled by medicine.   Your baby is older than 3 months with a rectal temperature of 102 F (38.9 C) or higher.   Your baby is 3 months old or younger with a rectal temperature of 100.4 F (38 C) or higher.   You become progressively more ill.   You have increased difficulty breathing, wheezing, or shortness of breath.  It is necessary to seek immediate medical care if you are elderly or sick from any other disease. MAKE SURE YOU:  Understand these instructions.   Will watch your condition.   Will get help right away if you are not doing well or get worse.  Document Released: 04/28/2005 Document Re-Released: 07/23/2009 [employer] Patient Information 2011 [employer]. 

## 2011-01-21 ENCOUNTER — Other Ambulatory Visit: Payer: Self-pay

## 2011-01-22 ENCOUNTER — Telehealth: Payer: Self-pay

## 2011-01-22 NOTE — Telephone Encounter (Signed)
It will need to be done in hospital with meds on regime

## 2011-01-22 NOTE — Telephone Encounter (Signed)
Call from Radiology and the patient is  Scheduled to have a Chest CT with contrast and is allergic to IVP dye, they have faxed over a regimen and would like to know what you would like to do.    Please advise   KP

## 2011-01-23 MED ORDER — PREDNISONE 50 MG PO TABS: ORAL_TABLET | ORAL | Status: DC

## 2011-01-23 NOTE — Telephone Encounter (Signed)
Prednisone 50 mg  1 po 13 hours prior to procedure, 1po 7 hours prior to procedure and 1 hour prior to procedure----#3 Benadryl 25 mg  2 po 1 hour before contrast

## 2011-01-23 NOTE — Telephone Encounter (Signed)
Discussed with patient and she voiced understanding. All of the directions are on the pill bottle    KP

## 2011-01-23 NOTE — Telephone Encounter (Signed)
mssg left to call back     KP 

## 2011-01-23 NOTE — Telephone Encounter (Signed)
They faxed a regimn over from the hospital and since you are the ordering provider you would need to choose, it is left on the ledge    KP

## 2011-01-24 ENCOUNTER — Other Ambulatory Visit: Payer: Self-pay | Admitting: Oncology

## 2011-01-24 ENCOUNTER — Encounter (HOSPITAL_BASED_OUTPATIENT_CLINIC_OR_DEPARTMENT_OTHER): Payer: BC Managed Care – PPO | Admitting: Oncology

## 2011-01-24 DIAGNOSIS — D638 Anemia in other chronic diseases classified elsewhere: Secondary | ICD-10-CM

## 2011-01-24 DIAGNOSIS — Z7901 Long term (current) use of anticoagulants: Secondary | ICD-10-CM

## 2011-01-24 DIAGNOSIS — I749 Embolism and thrombosis of unspecified artery: Secondary | ICD-10-CM

## 2011-01-24 LAB — PROTIME-INR
INR: 4.6 — ABNORMAL HIGH (ref 2.00–3.50)
Protime: 55.2 Seconds — ABNORMAL HIGH (ref 10.6–13.4)

## 2011-01-27 ENCOUNTER — Encounter: Payer: Self-pay | Admitting: Internal Medicine

## 2011-01-29 ENCOUNTER — Other Ambulatory Visit: Payer: Self-pay | Admitting: Internal Medicine

## 2011-01-30 ENCOUNTER — Other Ambulatory Visit: Payer: Self-pay | Admitting: Physician Assistant

## 2011-01-30 ENCOUNTER — Encounter (HOSPITAL_BASED_OUTPATIENT_CLINIC_OR_DEPARTMENT_OTHER): Payer: BC Managed Care – PPO | Admitting: Oncology

## 2011-01-30 DIAGNOSIS — K551 Chronic vascular disorders of intestine: Secondary | ICD-10-CM

## 2011-01-30 DIAGNOSIS — Z7901 Long term (current) use of anticoagulants: Secondary | ICD-10-CM

## 2011-01-30 DIAGNOSIS — I749 Embolism and thrombosis of unspecified artery: Secondary | ICD-10-CM

## 2011-01-30 DIAGNOSIS — D638 Anemia in other chronic diseases classified elsewhere: Secondary | ICD-10-CM

## 2011-01-30 LAB — CBC WITH DIFFERENTIAL/PLATELET
Basophils Absolute: 0.2 10*3/uL — ABNORMAL HIGH (ref 0.0–0.1)
EOS%: 15 % — ABNORMAL HIGH (ref 0.0–7.0)
HCT: 30.5 % — ABNORMAL LOW (ref 34.8–46.6)
HGB: 10.3 g/dL — ABNORMAL LOW (ref 11.6–15.9)
MCH: 29.3 pg (ref 25.1–34.0)
MCV: 86.8 fL (ref 79.5–101.0)
MONO%: 6.7 % (ref 0.0–14.0)
NEUT%: 32.6 % — ABNORMAL LOW (ref 38.4–76.8)
Platelets: 120 10*3/uL — ABNORMAL LOW (ref 145–400)

## 2011-01-31 ENCOUNTER — Ambulatory Visit (INDEPENDENT_AMBULATORY_CARE_PROVIDER_SITE_OTHER)
Admission: RE | Admit: 2011-01-31 | Discharge: 2011-01-31 | Disposition: A | Discharge status: Home / Self Care | Payer: BC Managed Care – PPO | Source: Ambulatory Visit | Attending: Family Medicine | Admitting: Family Medicine

## 2011-01-31 DIAGNOSIS — R918 Other nonspecific abnormal finding of lung field: Secondary | ICD-10-CM

## 2011-01-31 DIAGNOSIS — R05 Cough: Secondary | ICD-10-CM

## 2011-01-31 DIAGNOSIS — R9389 Abnormal findings on diagnostic imaging of other specified body structures: Secondary | ICD-10-CM

## 2011-01-31 DIAGNOSIS — R059 Cough, unspecified: Secondary | ICD-10-CM

## 2011-01-31 MED ORDER — IOHEXOL 300 MG/ML  SOLN
80.0000 mL | Freq: Once | INTRAMUSCULAR | Status: AC | PRN
  Administered 2011-01-31: 80 mL via INTRAVENOUS

## 2011-02-03 ENCOUNTER — Telehealth: Payer: Self-pay

## 2011-02-03 NOTE — Telephone Encounter (Signed)
Pt aware.

## 2011-02-03 NOTE — Telephone Encounter (Signed)
Message copied by Beverely Low on Mon Feb 03, 2011 11:31 AM ------      Message from: [name]      Created: Fri Jan 31, 2011 11:16 AM       Please have her follow up in 6 weeks to recheck PNA

## 2011-02-06 ENCOUNTER — Other Ambulatory Visit: Payer: Self-pay | Admitting: Physician Assistant

## 2011-02-06 ENCOUNTER — Encounter (HOSPITAL_BASED_OUTPATIENT_CLINIC_OR_DEPARTMENT_OTHER): Payer: BC Managed Care – PPO | Admitting: Oncology

## 2011-02-06 DIAGNOSIS — I749 Embolism and thrombosis of unspecified artery: Secondary | ICD-10-CM

## 2011-02-06 DIAGNOSIS — Z7901 Long term (current) use of anticoagulants: Secondary | ICD-10-CM

## 2011-02-06 DIAGNOSIS — K551 Chronic vascular disorders of intestine: Secondary | ICD-10-CM

## 2011-02-06 DIAGNOSIS — D638 Anemia in other chronic diseases classified elsewhere: Secondary | ICD-10-CM

## 2011-02-06 LAB — CBC WITH DIFFERENTIAL/PLATELET
BASO%: 1.4 % (ref 0.0–2.0)
EOS%: 8.9 % — ABNORMAL HIGH (ref 0.0–7.0)
HCT: 32.7 % — ABNORMAL LOW (ref 34.8–46.6)
LYMPH%: 42.9 % (ref 14.0–49.7)
MCH: 28.1 pg (ref 25.1–34.0)
MCHC: 31.5 g/dL (ref 31.5–36.0)
NEUT%: 34.2 % — ABNORMAL LOW (ref 38.4–76.8)
RBC: 3.66 10*6/uL — ABNORMAL LOW (ref 3.70–5.45)
lymph#: 3.1 10*3/uL (ref 0.9–3.3)

## 2011-02-06 LAB — PROTIME-INR: INR: 2.4 (ref 2.00–3.50)

## 2011-02-07 LAB — CATH TIP CULTURE: Culture: NO GROWTH

## 2011-02-18 LAB — CBC
HCT: 26 — ABNORMAL LOW
HCT: 28.1 — ABNORMAL LOW
HCT: 30.4 — ABNORMAL LOW
HCT: 31.5 — ABNORMAL LOW
HCT: 34.3 — ABNORMAL LOW
Hemoglobin: 10.4 — ABNORMAL LOW
Hemoglobin: 10.5 — ABNORMAL LOW
Hemoglobin: 10.7 — ABNORMAL LOW
Hemoglobin: 11.7 — ABNORMAL LOW
Hemoglobin: 8.8 — ABNORMAL LOW
Hemoglobin: 9.7 — ABNORMAL LOW
MCHC: 33.1
MCHC: 33.8
MCHC: 33.9
MCHC: 33.9
MCHC: 34.2
MCV: 97
MCV: 97.1
MCV: 97.3
MCV: 97.6
MCV: 97.8
Platelets: 121 — ABNORMAL LOW
Platelets: 145 — ABNORMAL LOW
RBC: 2.66 — ABNORMAL LOW
RBC: 3.13 — ABNORMAL LOW
RBC: 3.17 — ABNORMAL LOW
RDW: 12.4
RDW: 13.5
RDW: 13.5
RDW: 13.6
WBC: 6.1
WBC: 6.9

## 2011-02-18 LAB — BASIC METABOLIC PANEL
BUN: 23
CO2: 35 — ABNORMAL HIGH
Chloride: 103
Chloride: 96
Creatinine, Ser: 0.5
GFR calc Af Amer: >60
GFR calc non Af Amer: >60
Glucose, Bld: 116 — ABNORMAL HIGH
Potassium: 3.8
Potassium: 4.1
Potassium: 4.6
Sodium: 138
Sodium: 139
Sodium: 141

## 2011-02-18 LAB — PROTIME-INR
INR: 1.2
INR: 1.5
INR: 2.4 — ABNORMAL HIGH
Prothrombin Time: 18.4 — ABNORMAL HIGH
Prothrombin Time: 26.7 — ABNORMAL HIGH
Prothrombin Time: 28.4 — ABNORMAL HIGH

## 2011-02-18 LAB — COMPREHENSIVE METABOLIC PANEL
ALT: 72 — ABNORMAL HIGH
CO2: 32
Calcium: 8.5
GFR calc non Af Amer: >60
Glucose, Bld: 97
Sodium: 137
Total Bilirubin: 0.4

## 2011-02-18 LAB — BASIC METABOLIC PANEL WITH GFR
BUN: 17
CO2: 26
Calcium: 8 — ABNORMAL LOW
Chloride: 101
Creatinine, Ser: 0.45
GFR calc non Af Amer: >60
Glucose, Bld: 107 — ABNORMAL HIGH
Potassium: 4.3
Sodium: 133 — ABNORMAL LOW

## 2011-02-18 LAB — DIFFERENTIAL
Eosinophils Relative: 2
Lymphocytes Relative: 35
Lymphs Abs: 2.1
Monocytes Absolute: 0.6

## 2011-02-18 LAB — PHOSPHORUS: Phosphorus: 3.3

## 2011-02-18 LAB — MAGNESIUM: Magnesium: 2.2

## 2011-03-06 ENCOUNTER — Other Ambulatory Visit: Payer: Self-pay | Admitting: Physician Assistant

## 2011-03-06 ENCOUNTER — Encounter (HOSPITAL_BASED_OUTPATIENT_CLINIC_OR_DEPARTMENT_OTHER): Payer: BC Managed Care – PPO | Admitting: Oncology

## 2011-03-06 DIAGNOSIS — K551 Chronic vascular disorders of intestine: Secondary | ICD-10-CM

## 2011-03-06 DIAGNOSIS — Z7901 Long term (current) use of anticoagulants: Secondary | ICD-10-CM

## 2011-03-06 DIAGNOSIS — I749 Embolism and thrombosis of unspecified artery: Secondary | ICD-10-CM

## 2011-03-06 DIAGNOSIS — D638 Anemia in other chronic diseases classified elsewhere: Secondary | ICD-10-CM

## 2011-03-06 LAB — CBC WITH DIFFERENTIAL/PLATELET
BASO%: 0.8 % (ref 0.0–2.0)
EOS%: 7.8 % — ABNORMAL HIGH (ref 0.0–7.0)
MCH: 28.1 pg (ref 25.1–34.0)
MCHC: 31.6 g/dL (ref 31.5–36.0)
RBC: 3.98 10*6/uL (ref 3.70–5.45)
RDW: 16.4 % — ABNORMAL HIGH (ref 11.2–14.5)
WBC: 6.5 10*3/uL (ref 3.9–10.3)
lymph#: 2.9 10*3/uL (ref 0.9–3.3)
nRBC: 0 % (ref 0–0)

## 2011-03-06 LAB — PROTIME-INR
INR: 1.9 — ABNORMAL LOW (ref 2.00–3.50)
Protime: 22.8 Seconds — ABNORMAL HIGH (ref 10.6–13.4)

## 2011-03-06 NOTE — Progress Notes (Signed)
Quick Note:  I left him a message that polyp still there and I will call him back to discuss options. ______

## 2011-03-28 ENCOUNTER — Other Ambulatory Visit: Payer: Self-pay | Admitting: Internal Medicine

## 2011-04-02 ENCOUNTER — Telehealth: Payer: Self-pay | Admitting: *Deleted

## 2011-04-02 DIAGNOSIS — M810 Age-related osteoporosis without current pathological fracture: Secondary | ICD-10-CM

## 2011-04-02 NOTE — Telephone Encounter (Signed)
Called to advise pt of 30% co-pay for her reclast. Pt understood. Advised the need for a BMP blood draw to be set up. Pt requested that she is having a coumadin check at [facility] on 04-10-11 could she have the BMP drawn on that day as well. I advised that she could and to please alert the [facility] that she needs to have her BMP drawn on that day. Pt understood. Set up a future order for pt to have BMP drawn.

## 2011-04-08 ENCOUNTER — Encounter: Payer: Self-pay | Admitting: Family Medicine

## 2011-04-08 ENCOUNTER — Ambulatory Visit (INDEPENDENT_AMBULATORY_CARE_PROVIDER_SITE_OTHER): Payer: BC Managed Care – PPO | Admitting: Family Medicine

## 2011-04-08 ENCOUNTER — Other Ambulatory Visit: Payer: Self-pay | Admitting: Oncology

## 2011-04-08 VITALS — BP 110/65 | HR 64 | Temp 98.4°F | Ht 68.0 in | Wt 151.0 lb

## 2011-04-08 DIAGNOSIS — Z23 Encounter for immunization: Secondary | ICD-10-CM

## 2011-04-08 DIAGNOSIS — N390 Urinary tract infection, site not specified: Secondary | ICD-10-CM

## 2011-04-08 DIAGNOSIS — R9389 Abnormal findings on diagnostic imaging of other specified body structures: Secondary | ICD-10-CM

## 2011-04-08 LAB — POCT URINALYSIS DIPSTICK
Bilirubin, UA: NEGATIVE
Glucose, UA: NEGATIVE
Ketones, UA: NEGATIVE
Nitrite, UA: POSITIVE

## 2011-04-08 MED ORDER — NITROFURANTOIN MONOHYD MACRO 100 MG PO CAPS: 100.0000 mg | ORAL_CAPSULE | Freq: Two times a day (BID) | ORAL | Status: AC

## 2011-04-08 NOTE — Patient Instructions (Signed)
You have a bladder infection Start the Macrobid twice daily Drink plenty of fluids Call with any questions or concerns Happy Holidays!!!

## 2011-04-08 NOTE — Progress Notes (Signed)
  Subjective:    Patient ID: [name] [name], female    DOB: 1950-05-14, 60 y.o.   MRN: [id number]  HPI UTI- was told on Friday by [facility] that kidney fxn was 'up a little' and asked if she had UTI sxs.  Had some low back pain late last week but denies dysuria.  + frequency.   Review of Systems For ROS see HPI     Objective:   Physical Exam  Vitals reviewed. Constitutional: She appears well-developed and well-nourished. No distress.  Abdominal: Soft. She exhibits no distension. There is tenderness (mild suprapubic discomfort, no CVA tenderness).          Assessment & Plan:

## 2011-04-09 ENCOUNTER — Telehealth: Payer: Self-pay | Admitting: Oncology

## 2011-04-09 NOTE — Telephone Encounter (Signed)
called pts home lmovm for appt on 11/29 and to pick up scheduled for dec-[id number]

## 2011-04-10 ENCOUNTER — Other Ambulatory Visit (HOSPITAL_BASED_OUTPATIENT_CLINIC_OR_DEPARTMENT_OTHER): Payer: BC Managed Care – PPO | Admitting: Lab

## 2011-04-10 ENCOUNTER — Ambulatory Visit (HOSPITAL_BASED_OUTPATIENT_CLINIC_OR_DEPARTMENT_OTHER): Payer: BC Managed Care – PPO

## 2011-04-10 ENCOUNTER — Other Ambulatory Visit: Payer: Self-pay

## 2011-04-10 ENCOUNTER — Telehealth: Payer: Self-pay | Admitting: Oncology

## 2011-04-10 VITALS — BP 98/59 | HR 67 | Temp 98.0°F

## 2011-04-10 DIAGNOSIS — K551 Chronic vascular disorders of intestine: Secondary | ICD-10-CM

## 2011-04-10 DIAGNOSIS — D638 Anemia in other chronic diseases classified elsewhere: Secondary | ICD-10-CM

## 2011-04-10 LAB — CBC WITH DIFFERENTIAL/PLATELET
BASO%: 0.4 % (ref 0.0–2.0)
Basophils Absolute: 0 10*3/uL (ref 0.0–0.1)
EOS%: 4.8 % (ref 0.0–7.0)
HGB: 9.5 g/dL — ABNORMAL LOW (ref 11.6–15.9)
MCH: 27.8 pg (ref 25.1–34.0)
MCHC: 32.2 g/dL (ref 31.5–36.0)
RDW: 15.3 % — ABNORMAL HIGH (ref 11.2–14.5)
WBC: 5.3 10*3/uL (ref 3.9–10.3)
lymph#: 1.7 10*3/uL (ref 0.9–3.3)

## 2011-04-10 LAB — PROTIME-INR: INR: 3.8 — ABNORMAL HIGH (ref 2.00–3.50)

## 2011-04-10 MED ORDER — DARBEPOETIN ALFA-POLYSORBATE 500 MCG/ML IJ SOLN
300.0000 ug | Freq: Once | INTRAMUSCULAR | Status: AC
  Administered 2011-04-10: 300 ug via SUBCUTANEOUS
  Filled 2011-04-10: qty 1

## 2011-04-10 NOTE — Telephone Encounter (Signed)
pt came by and p/u scheduled for dec-[id number]

## 2011-04-11 LAB — URINE CULTURE

## 2011-04-13 DIAGNOSIS — R9389 Abnormal findings on diagnostic imaging of other specified body structures: Secondary | ICD-10-CM | POA: Insufficient documentation

## 2011-04-13 DIAGNOSIS — N390 Urinary tract infection, site not specified: Secondary | ICD-10-CM | POA: Insufficient documentation

## 2011-04-13 NOTE — Assessment & Plan Note (Signed)
Pt's UA consistent w/ infxn.  Start abx.  Reviewed supportive care and red flags that should prompt return.  Pt expressed understanding and is in agreement w/ plan.

## 2011-04-13 NOTE — Assessment & Plan Note (Signed)
Pt had abnormal chest CT in September and radiology was questioning whether this was due all to her PNA or if there was a separate process.  Repeat scan was recommended for now.  Pt is not interested in this b/c 'i feel good'.  States she typically has CT scans w/ GI and wants to know if this can be done then.  Will send message to GI to determine if this is possible and then decide on next steps.

## 2011-04-14 ENCOUNTER — Other Ambulatory Visit (INDEPENDENT_AMBULATORY_CARE_PROVIDER_SITE_OTHER): Payer: BC Managed Care – PPO

## 2011-04-14 DIAGNOSIS — M810 Age-related osteoporosis without current pathological fracture: Secondary | ICD-10-CM

## 2011-04-14 LAB — BASIC METABOLIC PANEL
GFR: 112.35 mL/min (ref >60.00)
Potassium: 3.6 mEq/L (ref 3.5–5.1)
Sodium: 136 mEq/L (ref 135–145)

## 2011-04-14 NOTE — Progress Notes (Signed)
12  

## 2011-04-15 ENCOUNTER — Encounter: Payer: Self-pay | Admitting: Internal Medicine

## 2011-04-15 ENCOUNTER — Telehealth: Payer: Self-pay | Admitting: *Deleted

## 2011-04-15 NOTE — Telephone Encounter (Signed)
Faxed BMP resultsbenifits/ and form to [facility] for appt to be set up by [facility] to call pt to receive reclast

## 2011-04-16 ENCOUNTER — Telehealth: Payer: Self-pay | Admitting: *Deleted

## 2011-04-16 ENCOUNTER — Other Ambulatory Visit (HOSPITAL_COMMUNITY): Payer: Self-pay | Admitting: *Deleted

## 2011-04-16 NOTE — Telephone Encounter (Signed)
Pt left vm about reclast appt. Called to advise pt that her results again were good for her BMP and that someone would contact her as soon as they have a reclast appt available. Pt understood

## 2011-04-21 ENCOUNTER — Telehealth: Payer: Self-pay | Admitting: *Deleted

## 2011-04-21 NOTE — Telephone Encounter (Signed)
Spoke to pt to verify that she had been set up with a reclast appt. Pt stated that she has an appt on 05-01-11 at 53

## 2011-04-29 ENCOUNTER — Other Ambulatory Visit: Payer: Self-pay | Admitting: Internal Medicine

## 2011-05-01 ENCOUNTER — Telehealth: Payer: Self-pay | Admitting: Family Medicine

## 2011-05-01 ENCOUNTER — Encounter (HOSPITAL_COMMUNITY)
Admission: RE | Admit: 2011-05-01 | Discharge: 2011-05-01 | Disposition: A | Discharge status: Home / Self Care | Payer: BC Managed Care – PPO | Source: Ambulatory Visit | Attending: Family Medicine | Admitting: Family Medicine

## 2011-05-01 ENCOUNTER — Other Ambulatory Visit: Payer: BC Managed Care – PPO | Admitting: Lab

## 2011-05-01 ENCOUNTER — Ambulatory Visit (HOSPITAL_BASED_OUTPATIENT_CLINIC_OR_DEPARTMENT_OTHER): Payer: BC Managed Care – PPO | Admitting: Oncology

## 2011-05-01 ENCOUNTER — Other Ambulatory Visit: Payer: Self-pay | Admitting: Oncology

## 2011-05-01 VITALS — BP 113/71 | HR 67 | Temp 98.4°F | Wt 154.3 lb

## 2011-05-01 DIAGNOSIS — K551 Chronic vascular disorders of intestine: Secondary | ICD-10-CM

## 2011-05-01 DIAGNOSIS — M810 Age-related osteoporosis without current pathological fracture: Secondary | ICD-10-CM | POA: Insufficient documentation

## 2011-05-01 DIAGNOSIS — Z7901 Long term (current) use of anticoagulants: Secondary | ICD-10-CM

## 2011-05-01 DIAGNOSIS — D649 Anemia, unspecified: Secondary | ICD-10-CM

## 2011-05-01 DIAGNOSIS — I749 Embolism and thrombosis of unspecified artery: Secondary | ICD-10-CM

## 2011-05-01 DIAGNOSIS — I82409 Acute embolism and thrombosis of unspecified deep veins of unspecified lower extremity: Secondary | ICD-10-CM

## 2011-05-01 DIAGNOSIS — D638 Anemia in other chronic diseases classified elsewhere: Secondary | ICD-10-CM

## 2011-05-01 LAB — CBC WITH DIFFERENTIAL/PLATELET
Basophils Absolute: 0 10*3/uL (ref 0.0–0.1)
HCT: 32.3 % — ABNORMAL LOW (ref 34.8–46.6)
HGB: 10.3 g/dL — ABNORMAL LOW (ref 11.6–15.9)
MONO#: 0.4 10*3/uL (ref 0.1–0.9)
NEUT%: 55.3 % (ref 38.4–76.8)
WBC: 4.7 10*3/uL (ref 3.9–10.3)
lymph#: 1.2 10*3/uL (ref 0.9–3.3)

## 2011-05-01 LAB — PROTIME-INR: INR: 1.9 — ABNORMAL LOW (ref 2.00–3.50)

## 2011-05-01 MED ORDER — HYDROCODONE-ACETAMINOPHEN 5-500 MG PO TABS: ORAL_TABLET | ORAL | Status: DC

## 2011-05-01 MED ORDER — ZOLEDRONIC ACID 5 MG/100ML IV SOLN
5.0000 mg | Freq: Once | INTRAVENOUS | Status: AC
  Administered 2011-05-01: 5 mg via INTRAVENOUS
  Filled 2011-05-01: qty 100

## 2011-05-01 MED ORDER — SODIUM CHLORIDE 0.9 % IV SOLN
INTRAVENOUS | Status: AC
  Administered 2011-05-01: 11:00:00 via INTRAVENOUS

## 2011-05-01 MED ORDER — DARBEPOETIN ALFA-POLYSORBATE 500 MCG/ML IJ SOLN
300.0000 ug | Freq: Once | INTRAMUSCULAR | Status: AC
  Administered 2011-05-01: 300 ug via SUBCUTANEOUS
  Filled 2011-05-01: qty 1

## 2011-05-01 NOTE — Telephone Encounter (Signed)
Patient had reclast infusion today - she wants to know if she can have pain med called in - [facility] - she is taking tylenol but she said she has joint pain

## 2011-05-01 NOTE — Telephone Encounter (Signed)
Manually faxed rx, spoke to pt to advise that reclast can cause body aches, however if symptoms worsen go to ER, please call with any concerns. Pt understood

## 2011-05-01 NOTE — Telephone Encounter (Signed)
Last OV 04-08-11

## 2011-05-01 NOTE — Telephone Encounter (Signed)
Ok for vicodin 5/500mg  1 tab Q4-6 prn for pain.  #30

## 2011-05-07 NOTE — Progress Notes (Signed)
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  Patient also has anemia of chronic disease and has been receiving Aranesp.  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  CURRENT THERAPY:   1. Coumadin to try to maintain an INR between 2.5 to 3.0. 2. Aranesp every 4 weeks to keep hematocrit at or above 36%.  INTERVAL HISTORY:  Patient is seen in followup today.  Overall, she seems to be doing well.  Her CBC is normal.  Platelets are stable at 142,000.  Hemoglobin is 12.2 with hematocrit of 36.0.  Her INR is well therapeutic at 2.5.  She is currently receiving 12 mg of Coumadin on a daily basis.  She is denying any nausea, vomiting, fevers, chills, night sweats, headaches.  No difficulty in swallowing.  She has no chest pains, no palpitations.  No cough, hemoptysis.  No bruising.  She denies any abdomen pain.  No hematuria, hematochezia, melena, hemoptysis or hematemesis.  Remainder of the 10-point review of systems is negative.  Of note, patient is planning on having her teeth extracted and eventually will be getting dentures.  She has a dental appointment next week.  She is asking how to best manage her anticoagulation.  ALLERGIES:  IV contrast and penicillin.  CURRENT MEDICATIONS:   1. Coumadin 12 mg daily or as directed. 2. Vitamin D 50,000 units daily. 3. Lomotil 3 times a day p.r.n.  4. Phenergan 25 mg as needed. 5. Cipro 500 mg twice a day as needed. 6. Benadryl as needed. 7. TUMS 4 tabs every day.  PHYSICAL EXAM:  Patient is awake, alert, in no acute distress.  She appears relatively well.  Vital signs:  Blood pressure 113/71, pulse 67, temperature 98.4 F (36.9 C), temperature source Oral, weight 154 lb 4.8 oz (69.99 kg).    HEENT exam:  EOMI.  PERRLA.  Sclerae are anicteric.  No conjunctival pallor.  Oral mucosa is moist.  Neck is supple.  Poor oral dental hygiene.   Lungs:  Clear bilaterally to auscultation.  Cardiovascular:  Regular rate and rhythm.  Abdomen:  Soft, nontender, nondistended.  Bowel sounds are present, no hepatosplenomegaly.  Extremities:  No edema.  Neuro is nonfocal.  LABORATORY DATA:   Lab Results  Component Value Date   WBC 4.7 05/01/2011   HGB 10.3* 05/01/2011   HCT 32.3* 05/01/2011   MCV 87.3 05/01/2011   PLT 113* 05/01/2011    IMPRESSION:  A 60 year old female with -  1. 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  Patient is on Coumadin.   2. Anemia of chronic disease. 3. Patient is on long-term TNA. 4. Poor oral dental hygiene, with significant dental caries.  PLAN: 1. We will continue patient's Coumadin at 12 mg daily.  We will check her INR every month. 2. She will continue Aranesp injections.  Today hematocrit is 36% so therefore we will hold her Aranesp.  She will return in 1 month's time for a CBC and Aranesp injection if needed. 3. For patient's TNA, she does go to [facility] and she will continue to be managed by them, as well as Dr Leone Payor. 4. For patient's dental hygiene, she is planning on having dental extractions and I have reminded the patient that she should let us know ahead of time so we can help with management of her anticoagulation during her procedures.  5. She knows to call us with any problems, questions or concerns.

## 2011-05-13 DIAGNOSIS — R7989 Other specified abnormal findings of blood chemistry: Secondary | ICD-10-CM

## 2011-05-13 DIAGNOSIS — R945 Abnormal results of liver function studies: Secondary | ICD-10-CM

## 2011-05-13 DIAGNOSIS — Z9289 Personal history of other medical treatment: Secondary | ICD-10-CM

## 2011-05-13 HISTORY — DX: Abnormal results of liver function studies: R94.5

## 2011-05-13 HISTORY — DX: Personal history of other medical treatment: Z92.89

## 2011-05-13 HISTORY — DX: Other specified abnormal findings of blood chemistry: R79.89

## 2011-05-19 ENCOUNTER — Encounter: Payer: Self-pay | Admitting: Internal Medicine

## 2011-05-29 ENCOUNTER — Other Ambulatory Visit: Payer: BC Managed Care – PPO | Admitting: Lab

## 2011-05-29 ENCOUNTER — Ambulatory Visit: Payer: BC Managed Care – PPO

## 2011-05-29 ENCOUNTER — Telehealth: Payer: Self-pay

## 2011-05-29 ENCOUNTER — Ambulatory Visit (HOSPITAL_BASED_OUTPATIENT_CLINIC_OR_DEPARTMENT_OTHER): Payer: BC Managed Care – PPO

## 2011-05-29 ENCOUNTER — Other Ambulatory Visit (HOSPITAL_BASED_OUTPATIENT_CLINIC_OR_DEPARTMENT_OTHER): Payer: BC Managed Care – PPO | Admitting: Lab

## 2011-05-29 VITALS — BP 108/70 | HR 73 | Temp 98.5°F

## 2011-05-29 DIAGNOSIS — D638 Anemia in other chronic diseases classified elsewhere: Secondary | ICD-10-CM

## 2011-05-29 DIAGNOSIS — I82409 Acute embolism and thrombosis of unspecified deep veins of unspecified lower extremity: Secondary | ICD-10-CM

## 2011-05-29 LAB — CBC WITH DIFFERENTIAL/PLATELET
Basophils Absolute: 0.1 10*3/uL (ref 0.0–0.1)
EOS%: 8.8 % — ABNORMAL HIGH (ref 0.0–7.0)
HGB: 9.5 g/dL — ABNORMAL LOW (ref 11.6–15.9)
MCH: 27.1 pg (ref 25.1–34.0)
NEUT#: 3.2 10*3/uL (ref 1.5–6.5)
RDW: 15.1 % — ABNORMAL HIGH (ref 11.2–14.5)
lymph#: 1.9 10*3/uL (ref 0.9–3.3)
nRBC: 0 % (ref 0–0)

## 2011-05-29 LAB — PROTIME-INR
INR: 1.7 — ABNORMAL LOW (ref 2.00–3.50)
Protime: 20.4 Seconds — ABNORMAL HIGH (ref 10.6–13.4)

## 2011-05-29 MED ORDER — DARBEPOETIN ALFA-POLYSORBATE 500 MCG/ML IJ SOLN
300.0000 ug | Freq: Once | INTRAMUSCULAR | Status: AC
  Administered 2011-05-29: 300 ug via SUBCUTANEOUS
  Filled 2011-05-29: qty 1

## 2011-05-29 NOTE — Telephone Encounter (Signed)
Called pt to inquire about how much coumadin she is taking.  Pt reports taking 10 mg daily.  INR today 1.7.  Pt states she uses TPN and has had some recent changes with this.  Pt states there is a shortage of lipids and vitamins, and the other night, one bag of her TPN had more vitamin K that normal.  She wonders could this have affected her levels today.  She also states [facility] will be coming to draw TPN labs on Friday or Monday, and she wonders does she needs another PT/INR drawn and faxed to Dr. Welton Flakes?  Informed her will leave note for MD review, and office will call her back.  She verbalizes understanding.

## 2011-05-29 NOTE — Telephone Encounter (Signed)
Called pt and left message to call office back.  Need to inform her per Dr. Welton Flakes, pt should continue same dose of coumadin, and keep same lab appt 06/26/11.

## 2011-05-30 ENCOUNTER — Telehealth: Payer: Self-pay | Admitting: *Deleted

## 2011-05-30 ENCOUNTER — Other Ambulatory Visit: Payer: Self-pay | Admitting: *Deleted

## 2011-05-30 MED ORDER — WARFARIN SODIUM 10 MG PO TABS: 10.0000 mg | ORAL_TABLET | ORAL | Status: DC

## 2011-05-30 NOTE — Telephone Encounter (Signed)
Called notified pt to continue Coumadin 10mg  daily, recheck labs on 06/26/11. Pt requested Refill

## 2011-06-03 ENCOUNTER — Encounter: Payer: Self-pay | Admitting: Family Medicine

## 2011-06-03 ENCOUNTER — Ambulatory Visit (INDEPENDENT_AMBULATORY_CARE_PROVIDER_SITE_OTHER): Payer: BC Managed Care – PPO | Admitting: Family Medicine

## 2011-06-03 VITALS — BP 115/75 | HR 93 | Temp 99.4°F | Ht 68.5 in | Wt 154.6 lb

## 2011-06-03 DIAGNOSIS — E860 Dehydration: Secondary | ICD-10-CM

## 2011-06-03 DIAGNOSIS — E639 Nutritional deficiency, unspecified: Secondary | ICD-10-CM

## 2011-06-03 DIAGNOSIS — N390 Urinary tract infection, site not specified: Secondary | ICD-10-CM

## 2011-06-03 DIAGNOSIS — J019 Acute sinusitis, unspecified: Secondary | ICD-10-CM

## 2011-06-03 MED ORDER — LEVOFLOXACIN IN D5W 750 MG/150ML IV SOLN: 750.0000 mg | INTRAVENOUS | Status: DC

## 2011-06-03 MED ORDER — LEVOFLOXACIN IN D5W 500 MG/100ML IV SOLN: 500.0000 mg | INTRAVENOUS | Status: DC

## 2011-06-03 NOTE — Progress Notes (Signed)
  Subjective:    Patient ID: [name] [name], female    DOB: 18-Jul-1950, 61 y.o.   MRN: [id number]  HPI TPN problems- first having problems getting correct vitamin make up.  Now having issues getting appropriate amount of lipids.  Has been getting bags of 'sugar water' in place of normal TPN.  Daughter reports she has been unable to get out of recliner due to such fatigue.  Having urinary frequency.  + dysuria, odor.  + sinus pain.  Low grade temps.  No ear pain, sore throat.  Mild cough.  + sick contacts.   Review of Systems For ROS see HPI     Objective:   Physical Exam  Vitals reviewed. Constitutional: She is oriented to person, place, and time. She appears well-developed and well-nourished. No distress.  HENT:  Head: Normocephalic and atraumatic.  Right Ear: Tympanic membrane normal.  Left Ear: Tympanic membrane normal.  Nose: Mucosal edema and rhinorrhea present. Right sinus exhibits maxillary sinus tenderness and frontal sinus tenderness. Left sinus exhibits maxillary sinus tenderness and frontal sinus tenderness.  Mouth/Throat: Uvula is midline and mucous membranes are normal. Posterior oropharyngeal erythema present. No oropharyngeal exudate.  Eyes: Conjunctivae and EOM are normal. Pupils are equal, round, and reactive to light.  Neck: Normal range of motion. Neck supple.  Cardiovascular: Normal rate, regular rhythm and normal heart sounds.   Pulmonary/Chest: Effort normal and breath sounds normal. No respiratory distress. She has no wheezes.  Abdominal: Soft. She exhibits no distension. There is tenderness (suprapubic TTP).  Musculoskeletal: She exhibits no edema.  Lymphadenopathy:    She has no cervical adenopathy.  Neurological: She is alert and oriented to person, place, and time.  Skin: Skin is warm and dry.          Assessment & Plan:

## 2011-06-03 NOTE — Patient Instructions (Signed)
The plan is for Levaquin daily x10 days from [facility] If you continue to feel poorly after this- let me know! Hang in there!!!

## 2011-06-04 ENCOUNTER — Emergency Department (HOSPITAL_COMMUNITY): Admission: EM | Admit: 2011-06-04 | Payer: BC Managed Care – PPO | Source: Home / Self Care

## 2011-06-04 ENCOUNTER — Telehealth: Payer: Self-pay | Admitting: *Deleted

## 2011-06-04 ENCOUNTER — Telehealth: Payer: Self-pay | Admitting: Internal Medicine

## 2011-06-04 ENCOUNTER — Emergency Department (HOSPITAL_COMMUNITY)
Admission: EM | Admit: 2011-06-04 | Discharge: 2011-06-04 | Disposition: A | Discharge status: Home / Self Care | Payer: BC Managed Care – PPO | Attending: Emergency Medicine | Admitting: Emergency Medicine

## 2011-06-04 ENCOUNTER — Encounter (HOSPITAL_COMMUNITY): Payer: Self-pay | Admitting: *Deleted

## 2011-06-04 DIAGNOSIS — R197 Diarrhea, unspecified: Secondary | ICD-10-CM | POA: Insufficient documentation

## 2011-06-04 DIAGNOSIS — Z7901 Long term (current) use of anticoagulants: Secondary | ICD-10-CM | POA: Insufficient documentation

## 2011-06-04 DIAGNOSIS — R5381 Other malaise: Secondary | ICD-10-CM | POA: Insufficient documentation

## 2011-06-04 DIAGNOSIS — J019 Acute sinusitis, unspecified: Secondary | ICD-10-CM | POA: Insufficient documentation

## 2011-06-04 DIAGNOSIS — K912 Postsurgical malabsorption, not elsewhere classified: Secondary | ICD-10-CM | POA: Insufficient documentation

## 2011-06-04 LAB — CBC
HCT: 27.3 % — ABNORMAL LOW (ref 36.0–46.0)
Hemoglobin: 8.8 g/dL — ABNORMAL LOW (ref 12.0–15.0)
MCHC: 32.2 g/dL (ref 30.0–36.0)
WBC: 5.4 10*3/uL (ref 4.0–10.5)

## 2011-06-04 LAB — DIFFERENTIAL
Basophils Absolute: 0 10*3/uL (ref 0.0–0.1)
Basophils Relative: 0 % (ref 0–1)
Eosinophils Absolute: 0.2 10*3/uL (ref 0.0–0.7)
Lymphocytes Relative: 26 % (ref 12–46)
Monocytes Relative: 10 % (ref 3–12)
Neutrophils Relative %: 61 % (ref 43–77)

## 2011-06-04 LAB — COMPREHENSIVE METABOLIC PANEL
ALT: 16 U/L (ref 0–35)
Albumin: 2.7 g/dL — ABNORMAL LOW (ref 3.5–5.2)
Alkaline Phosphatase: 106 U/L (ref 39–117)
Chloride: 98 mEq/L (ref 96–112)
Glucose, Bld: 110 mg/dL — ABNORMAL HIGH (ref 70–99)
Potassium: 3.8 mEq/L (ref 3.5–5.1)
Sodium: 129 mEq/L — ABNORMAL LOW (ref 135–145)
Total Bilirubin: 0.5 mg/dL (ref 0.3–1.2)
Total Protein: 9.3 g/dL — ABNORMAL HIGH (ref 6.0–8.3)

## 2011-06-04 LAB — PROTIME-INR: INR: 2.59 — ABNORMAL HIGH (ref 0.00–1.49)

## 2011-06-04 MED ORDER — POTASSIUM PHOSPHATE DIBASIC 3 MMOLE/ML IV SOLN
10.0000 mmol | Freq: Once | INTRAVENOUS | Status: DC
  Filled 2011-06-04: qty 3.33

## 2011-06-04 MED ORDER — POTASSIUM PHOSPHATE DIBASIC 3 MMOLE/ML IV SOLN
10.0000 mmol | Freq: Once | INTRAVENOUS | Status: AC
  Administered 2011-06-04: 10 mmol via INTRAVENOUS
  Filled 2011-06-04: qty 3.33

## 2011-06-04 MED ORDER — LEVOFLOXACIN IN D5W 500 MG/100ML IV SOLN
500.0000 mg | Freq: Once | INTRAVENOUS | Status: AC
  Administered 2011-06-04: 500 mg via INTRAVENOUS
  Filled 2011-06-04: qty 100

## 2011-06-04 MED ORDER — K PHOS MONO-SOD PHOS DI & MONO 155-852-130 MG PO TABS
500.0000 mg | ORAL_TABLET | Freq: Once | ORAL | Status: AC
  Administered 2011-06-04: 500 mg via ORAL
  Filled 2011-06-04: qty 2

## 2011-06-04 MED ORDER — SODIUM CHLORIDE 0.9 % IV BOLUS (SEPSIS)
1000.0000 mL | Freq: Once | INTRAVENOUS | Status: AC
  Administered 2011-06-04: 1000 mL via INTRAVENOUS

## 2011-06-04 NOTE — Telephone Encounter (Signed)
Spoke with pt and she was sent to the ER by Jesse Fall RN.

## 2011-06-04 NOTE — ED Notes (Signed)
Seen at [facility] yesterday for "not feeling well."  Dx with UTI, called today and told had abnormal labs, sent for eval. Is a TPN pt and has not been receiving full dosing of lipids, vitamins due to shortage. Pt reports being "kinda nauseous, just not feeling well."

## 2011-06-04 NOTE — Assessment & Plan Note (Signed)
Pt reports problems w/ TPN- vitamins on backorder, lipids have been out of stock.  Lipid problem resolved this week but still waiting on vitamins.  Fear this is hindering her ability to fight infection.  Stressed importance to pt and daughter the need to remedy this situation w/ supplier.  Will assist as able.

## 2011-06-04 NOTE — Telephone Encounter (Signed)
Critical phosphorus at 0.6 She is very weak Just started IV cipro for UTI  Has had reduction in TPN contents due to critical shortages

## 2011-06-04 NOTE — ED Notes (Addendum)
Paperwork faxed from [place] received by triage RN. Phosphorus noted to be 0.6. Informed EDP King. Orders received to begin lab work while waiting for treatment room.

## 2011-06-04 NOTE — Assessment & Plan Note (Signed)
Pt's PE consistent w/ infxn.  Start abx via PICC.  Pt expressed understanding and is in agreement w/ plan.

## 2011-06-04 NOTE — ED Notes (Signed)
Waiting on potassium phosphate to finish before discharging patient. Potassium phosphate to be finished by 2130.

## 2011-06-04 NOTE — Assessment & Plan Note (Signed)
Pt's UA and sxs consistent w/ infxn.  Start abx via PICC.  Encouraged increased water intake.  Will follow.

## 2011-06-04 NOTE — ED Provider Notes (Signed)
History     CSN: [id number]  Arrival date & time 06/04/11  1250   First MD Initiated Contact with Patient 06/04/11 1518      Chief Complaint  Patient presents with  . Abnormal Lab     HPI The patient presents with fatigue.  She notes that her symptoms gradually approximately 3 weeks ago since onset.  Symptoms have been worsening over this timeframe.  She also complains mild anorexia, mild nausea. Notably, the patient has short gut syndrome and is on TPN chronically.  Her formula has been switched recently, though she is unsure what the actual switch was. The patient presented to her primary care physician yesterday had blood drawn.  Those results are notable for hypophosphatemia. No headache, no confusion, no particular pain. No clear exacerbating factors, nor any alleviating conditions.    Past Medical History  Diagnosis Date  . Short bowel syndrome   . Abnormal LFTs   . Vitamin D deficiency   . Personal history of colonic polyps   . Thrombophilia   . Diabetes mellitus     resolved after weight loss  . Obesity     prior to weight loss  . Allergic rhinosinusitis   . At risk for dental problems   . Fracture of left clavicle   . Osteoporosis   . Anemia   . Renal insufficiency   . Atypical nevus   . Pathologic fracture of neck of femur     Past Surgical History  Procedure Date  . Small intestine surgery 2005    multiple with right colon resection for ischemia/infarct  . Cholecystectomy   . Orif proximal femoral fracture w/ itst nail system 03/2007    left, Dr. Simonne Come  . Esophagogastroduodenoscopy 01/22/2009    erosive esophagitis  . Colonoscopy 12/05/2005    internal hemorrhoids (for polyp surveillance)    Family History  Problem Relation Age of Onset  . Diabetes Mother   . Hypertension Mother   . Colon cancer Neg Hx     History  Substance Use Topics  . Smoking status: Former Smoker    Types: Cigarettes    Quit date: 05/12/2004  . Smokeless tobacco:  Not on file  . Alcohol Use: No    OB History    Grav Para Term Preterm Abortions TAB SAB Ect Mult Living                  Review of Systems  Constitutional:       HPI  HENT:       HPI otherwise negative  Eyes: Negative.   Respiratory:       HPI, otherwise negative  Cardiovascular:       HPI, otherwise nmegative  Gastrointestinal: Positive for diarrhea. Negative for vomiting, abdominal pain, constipation, blood in stool, abdominal distention and anal bleeding.  Genitourinary:       HPI, otherwise negative  Musculoskeletal:       HPI, otherwise negative  Skin: Negative.   Neurological: Negative for syncope.    Allergies  Iohexol and Penicillins  Home Medications   Current Outpatient Rx  Name Route Sig Dispense Refill  . CALCIUM CARBONATE ANTACID 750 MG PO CHEW Oral Chew 2 tablets by mouth 3 (three) times daily. Calcium    . DIPHENHYDRAMINE HCL 25 MG PO CAPS Oral Take 25 mg by mouth as needed.      Marland Kitchen EFLORNITHINE HCL 13.9 % EX CREA Topical Apply topically 2 (two) times daily with a meal.      .  HYDROCODONE-ACETAMINOPHEN 5-500 MG PO TABS  Take 1 Tab every 4-6 hours PRN for pain 30 tablet 0  . LEVOFLOXACIN IN D5W 500 MG/100ML IV SOLN Intravenous Inject 500 mg into the vein daily. Pt started on 06-03-11 x 10 day therapy. Pt's on day 1 of therapy    . OMEPRAZOLE 40 MG PO CPDR Oral Take 40 mg by mouth daily.    . TPN ELECTROLYTES IV Intravenous Inject 1 each into the vein at bedtime.     Marland Kitchen PROMETHAZINE HCL 25 MG PO TABS Oral Take 25 mg by mouth as needed.      Marland Kitchen URSODIOL 300 MG PO CAPS  TAKE 1 CAPSULE BY MOUTH TWICE DAILY 60 capsule 0    PLEASE HAVE PATIENT CALL FOR AN APPOINTMENT!!!  . VITAMIN D (ERGOCALCIFEROL) 50000 UNITS PO CAPS      . WARFARIN SODIUM 10 MG PO TABS Oral Take 10 mg by mouth daily. 10mg  daily -per pt    . ALBUTEROL SULFATE HFA 108 (90 BASE) MCG/ACT IN AERS  2 puffs qid prn 1 Inhaler 0  . BUDESONIDE-FORMOTEROL FUMARATE 160-4.5 MCG/ACT IN AERO Inhalation  Inhale 2 puffs into the lungs 2 (two) times daily. 1 Inhaler 12    BP 131/102  Pulse 87  Temp(Src) 99.3 F (37.4 C) (Oral)  Resp 20  SpO2 98%  Physical Exam  Nursing note and vitals reviewed. Constitutional: She is oriented to person, place, and time. She appears well-developed and well-nourished. No distress.  HENT:  Head: Normocephalic and atraumatic.  Eyes: Conjunctivae and EOM are normal.  Cardiovascular: Normal rate and regular rhythm.   Pulmonary/Chest: Effort normal and breath sounds normal. No stridor. No respiratory distress.  Abdominal: She exhibits no distension.  Musculoskeletal: She exhibits no edema.  Neurological: She is alert and oriented to person, place, and time. No cranial nerve deficit.  Skin: Skin is warm and dry.  Psychiatric: She has a normal mood and affect.    ED Course  Procedures (including critical care time)  Labs Reviewed  CBC - Abnormal; Notable for the following:    RBC 3.20 (*)    Hemoglobin 8.8 (*)    HCT 27.3 (*)    Platelets 112 (*)    All other components within normal limits  COMPREHENSIVE METABOLIC PANEL - Abnormal; Notable for the following:    Sodium 129 (*)    Glucose, Bld 110 (*)    Total Protein 9.3 (*)    Albumin 2.7 (*)    GFR calc non Af Amer 90 (*)    All other components within normal limits  PHOSPHORUS - Abnormal; Notable for the following:    Phosphorus 0.7 (*)    All other components within normal limits  DIFFERENTIAL  MAGNESIUM  PROTIME-INR   No results found.   No diagnosis found.    MDM  This patient with short gut syndrome and chronic TPN use presents with weeks of fatigue, and concerns over hypophosphatemia.  On exam the patient is in no distress, with no focal findings, and with unremarkable vital signs.  The patient's labs today are consistent with hypophosphatemia.  Given the patient's chronic progression, the absence of notable physical exam or vital sign abnormalities, there was low suspicion for  acute process.  The patient received both IV and oral supplementation of her phosphate.  Notably, there was a pharmacy care it took several hours, and attempts to fix.  The patient was adamant about discharged with next a followup.  Given the aforementioned absence  of acute findings, the chronic progression of her condition, this request was granted, and the patient will have her labs rechecked tomorrow, and follow up with her primary care physician        Gerhard Munch, MD 06/05/11 [phone]

## 2011-06-04 NOTE — Telephone Encounter (Signed)
Per Dr. Leone Payor, patient needs to go to [facility] ED.( Phosphorus level critical) Patient aware and is going to [facility] ED. Labs faxed over to triage nurse at [facility]) at [phone].

## 2011-06-04 NOTE — Telephone Encounter (Signed)
Discussed with hospitalist service Patient needs to be seen in ED Please call her and have her go to [facility] ED because of severe hypophosphatemia and weakness - needs admission and IV supplementation  I had advised patient this is likely and she knows we will be calling back  Fax hardcopy labs to ED also - hopefully they will show up in Epic soon but they were done at [facility] if needed to know

## 2011-06-05 ENCOUNTER — Telehealth: Payer: Self-pay

## 2011-06-05 NOTE — Telephone Encounter (Signed)
I spoke with the patient and she is feeling much better this am.  I also spoke with Miranda at [facility] she is on of the pharmacists [phone] ext [phone].  They did receive a shipment of phosphorus yesterday and should have a full supply next week.  They will replace the phosphorus in the TPN to her previous levels.     The patient is c/o problems with her TPN.  That for the last few weeks she is not getting all of the lipids, multivitamins, and other TPN ingredients due to shortages nationally.  Has been a problem for the last few weeks.  She wanted to make you aware.  She has looked into another supplier, but apparently this is her only option.

## 2011-06-05 NOTE — Telephone Encounter (Signed)
Message copied by Annett Fabian on Thu Jun 05, 2011 10:01 AM ------      Message from: Stan Head E      Created: Thu Jun 05, 2011  8:25 AM      Regarding: low phosphorus       She had a critical phosphorus yesterday and I had her go to ED with plans for admit (short-term) to replete IV            She got care in ED and went home at her request - will you check with her re: how she is and [facility] re: can they increase phosphorus in TPN (there is a shortage).            I did not think we could get very far with oral supps due to short gut but we may have to try that

## 2011-06-07 ENCOUNTER — Other Ambulatory Visit: Payer: Self-pay | Admitting: Internal Medicine

## 2011-06-09 MED ORDER — SULFAMETHOXAZOLE-TRIMETHOPRIM 200-40 MG/5ML PO SUSP: 20.0000 mL | Freq: Two times a day (BID) | ORAL | Status: AC

## 2011-06-09 NOTE — Progress Notes (Signed)
Addended by: Derry Lory A on: 06/09/2011 03:15 PM   Modules accepted: Orders

## 2011-06-26 ENCOUNTER — Ambulatory Visit: Payer: BC Managed Care – PPO

## 2011-06-26 ENCOUNTER — Other Ambulatory Visit: Payer: BC Managed Care – PPO | Admitting: Lab

## 2011-06-30 ENCOUNTER — Other Ambulatory Visit: Payer: Self-pay | Admitting: Oncology

## 2011-07-08 ENCOUNTER — Other Ambulatory Visit: Payer: BC Managed Care – PPO | Admitting: Lab

## 2011-07-08 ENCOUNTER — Ambulatory Visit (HOSPITAL_BASED_OUTPATIENT_CLINIC_OR_DEPARTMENT_OTHER): Payer: BC Managed Care – PPO

## 2011-07-08 VITALS — BP 111/68 | HR 74 | Temp 98.4°F

## 2011-07-08 DIAGNOSIS — D649 Anemia, unspecified: Secondary | ICD-10-CM

## 2011-07-08 DIAGNOSIS — Z7901 Long term (current) use of anticoagulants: Secondary | ICD-10-CM

## 2011-07-08 DIAGNOSIS — Z5181 Encounter for therapeutic drug level monitoring: Secondary | ICD-10-CM

## 2011-07-08 DIAGNOSIS — I82409 Acute embolism and thrombosis of unspecified deep veins of unspecified lower extremity: Secondary | ICD-10-CM

## 2011-07-08 DIAGNOSIS — D638 Anemia in other chronic diseases classified elsewhere: Secondary | ICD-10-CM

## 2011-07-08 LAB — PROTIME-INR
INR: 1.9 — ABNORMAL LOW (ref 2.00–3.50)
Protime: 22.8 Seconds — ABNORMAL HIGH (ref 10.6–13.4)

## 2011-07-08 LAB — CBC WITH DIFFERENTIAL/PLATELET
BASO%: 0.6 % (ref 0.0–2.0)
Basophils Absolute: 0 10*3/uL (ref 0.0–0.1)
Eosinophils Absolute: 0.2 10*3/uL (ref 0.0–0.5)
HCT: 25.6 % — ABNORMAL LOW (ref 34.8–46.6)
HGB: 8.4 g/dL — ABNORMAL LOW (ref 11.6–15.9)
MONO#: 0.3 10*3/uL (ref 0.1–0.9)
NEUT#: 2.3 10*3/uL (ref 1.5–6.5)
NEUT%: 57.2 % (ref 38.4–76.8)
WBC: 4 10*3/uL (ref 3.9–10.3)
lymph#: 1.1 10*3/uL (ref 0.9–3.3)

## 2011-07-08 LAB — IRON AND TIBC
%SAT: 13 % — ABNORMAL LOW (ref 20–55)
TIBC: 277 ug/dL (ref 250–470)

## 2011-07-08 MED ORDER — DARBEPOETIN ALFA-POLYSORBATE 300 MCG/0.6ML IJ SOLN
300.0000 ug | Freq: Once | INTRAMUSCULAR | Status: AC
  Administered 2011-07-08: 300 ug via SUBCUTANEOUS
  Filled 2011-07-08: qty 0.6

## 2011-07-11 ENCOUNTER — Other Ambulatory Visit: Payer: Self-pay | Admitting: Internal Medicine

## 2011-07-11 ENCOUNTER — Other Ambulatory Visit: Payer: Self-pay | Admitting: Oncology

## 2011-07-22 ENCOUNTER — Other Ambulatory Visit: Payer: Self-pay | Admitting: Oncology

## 2011-07-24 ENCOUNTER — Ambulatory Visit (HOSPITAL_BASED_OUTPATIENT_CLINIC_OR_DEPARTMENT_OTHER): Payer: BC Managed Care – PPO

## 2011-07-24 ENCOUNTER — Other Ambulatory Visit (HOSPITAL_BASED_OUTPATIENT_CLINIC_OR_DEPARTMENT_OTHER): Payer: BC Managed Care – PPO | Admitting: Lab

## 2011-07-24 VITALS — BP 107/65 | HR 65 | Temp 99.2°F

## 2011-07-24 DIAGNOSIS — K551 Chronic vascular disorders of intestine: Secondary | ICD-10-CM

## 2011-07-24 DIAGNOSIS — Z7901 Long term (current) use of anticoagulants: Secondary | ICD-10-CM

## 2011-07-24 DIAGNOSIS — D638 Anemia in other chronic diseases classified elsewhere: Secondary | ICD-10-CM

## 2011-07-24 DIAGNOSIS — I749 Embolism and thrombosis of unspecified artery: Secondary | ICD-10-CM

## 2011-07-24 LAB — CBC WITH DIFFERENTIAL/PLATELET
BASO%: 1.4 % (ref 0.0–2.0)
EOS%: 7.2 % — ABNORMAL HIGH (ref 0.0–7.0)
HCT: 28.3 % — ABNORMAL LOW (ref 34.8–46.6)
MCH: 26.3 pg (ref 25.1–34.0)
MCHC: 31.4 g/dL — ABNORMAL LOW (ref 31.5–36.0)
NEUT%: 36.4 % — ABNORMAL LOW (ref 38.4–76.8)
lymph#: 1.5 10*3/uL (ref 0.9–3.3)

## 2011-07-24 LAB — PROTIME-INR: INR: 2.9 (ref 2.00–3.50)

## 2011-07-24 MED ORDER — DARBEPOETIN ALFA-POLYSORBATE 300 MCG/0.6ML IJ SOLN
300.0000 ug | Freq: Once | INTRAMUSCULAR | Status: AC
  Administered 2011-07-24: 300 ug via SUBCUTANEOUS
  Filled 2011-07-24: qty 0.6

## 2011-07-31 ENCOUNTER — Other Ambulatory Visit: Payer: Self-pay | Admitting: Oncology

## 2011-08-06 ENCOUNTER — Encounter: Payer: Self-pay | Admitting: Internal Medicine

## 2011-08-11 ENCOUNTER — Telehealth: Payer: Self-pay | Admitting: Internal Medicine

## 2011-08-11 DIAGNOSIS — Z452 Encounter for adjustment and management of vascular access device: Secondary | ICD-10-CM

## 2011-08-11 NOTE — Telephone Encounter (Signed)
Pt reports it's time for a new PICC line. I asked pt if she can wait until tomorrow when Darcey Nora his regular nurse comes back and she agreed. Dr Leone Payor, pt wants to know if she can have 1 Valium for the procedure for anxiety and pain pills afterward d/t pain afterward? Thanks.

## 2011-08-12 NOTE — Telephone Encounter (Signed)
Patient reports last PICC line change was 08/22/10.  She reports that she is having afternoon low grade fevers and night sweats.  I will arrange for PICC line change one day this week  It is scheduled for tomorrow at 11:30 at [facility].  She is advised to arrive by 11:30 and be NPO after midnight

## 2011-08-13 ENCOUNTER — Ambulatory Visit (HOSPITAL_COMMUNITY)
Admission: RE | Admit: 2011-08-13 | Discharge: 2011-08-13 | Disposition: A | Discharge status: Home / Self Care | Payer: BC Managed Care – PPO | Source: Ambulatory Visit | Attending: Internal Medicine | Admitting: Internal Medicine

## 2011-08-13 ENCOUNTER — Other Ambulatory Visit: Payer: Self-pay | Admitting: Internal Medicine

## 2011-08-13 ENCOUNTER — Other Ambulatory Visit: Payer: Self-pay | Admitting: *Deleted

## 2011-08-13 DIAGNOSIS — Z452 Encounter for adjustment and management of vascular access device: Secondary | ICD-10-CM

## 2011-08-13 DIAGNOSIS — R509 Fever, unspecified: Secondary | ICD-10-CM | POA: Insufficient documentation

## 2011-08-13 MED ORDER — LIDOCAINE HCL 1 % IJ SOLN
INTRAMUSCULAR | Status: AC
  Filled 2011-08-13: qty 20

## 2011-08-13 NOTE — Procedures (Signed)
PICC replaced - used for long term TNA.  Left SL PICC 42 cm in length with tip at cavo-atrial junction.  No pneumothorax.

## 2011-08-14 ENCOUNTER — Other Ambulatory Visit: Payer: Self-pay | Admitting: Oncology

## 2011-08-14 ENCOUNTER — Other Ambulatory Visit: Payer: Self-pay | Admitting: Internal Medicine

## 2011-08-15 ENCOUNTER — Encounter: Payer: Self-pay | Admitting: Family Medicine

## 2011-08-15 ENCOUNTER — Ambulatory Visit (INDEPENDENT_AMBULATORY_CARE_PROVIDER_SITE_OTHER): Payer: BC Managed Care – PPO | Admitting: Family Medicine

## 2011-08-15 VITALS — BP 112/67 | HR 84 | Temp 98.2°F | Ht 67.0 in | Wt 158.2 lb

## 2011-08-15 DIAGNOSIS — R509 Fever, unspecified: Secondary | ICD-10-CM

## 2011-08-15 LAB — CBC WITH DIFFERENTIAL/PLATELET
Basophils Absolute: 0 10*3/uL (ref 0.0–0.1)
Eosinophils Relative: 4.3 % (ref 0.0–5.0)
HCT: 23.4 % — CL (ref 36.0–46.0)
Lymphocytes Relative: 31.4 % (ref 12.0–46.0)
Lymphs Abs: 1.5 10*3/uL (ref 0.7–4.0)
Monocytes Relative: 12 % (ref 3.0–12.0)
Platelets: 114 10*3/uL — ABNORMAL LOW (ref 150.0–400.0)
RDW: 16.1 % — ABNORMAL HIGH (ref 11.5–14.6)
WBC: 4.9 10*3/uL (ref 4.5–10.5)

## 2011-08-15 NOTE — Progress Notes (Signed)
Addended by: Silvio Pate D on: 08/15/2011 11:49 AM   Modules accepted: Orders

## 2011-08-15 NOTE — Assessment & Plan Note (Signed)
New.  Pt's fever curve is trending down after removal of PICC line.  No obvious infxn on PE- pt reports feeling well.  Check CBC to assess WBC and get blood cx's x2.  If WBC is elevated will start empiric abx over the weekend while awaiting cx results.  Reviewed supportive care and red flags that should prompt return.  Pt expressed understanding and is in agreement w/ plan.

## 2011-08-15 NOTE — Patient Instructions (Signed)
We'll call you later this afternoon w/ your CBC results If your white count is elevated, we'll start you on antibiotics over the weekend while your culture is cooking Call with any questions or concerns Hang in there!

## 2011-08-15 NOTE — Progress Notes (Signed)
  Subjective:    Patient ID: [name] [name], female    DOB: Dec 26, 1950, 61 y.o.   MRN: [id number]  HPI Fevers- started 2 weeks ago.  Occuring at night but afebrile during the day.  Pt reports she has hx of similar when it is time to change PICC line.  Line was changed 2 days ago- attempted to switch arms but 'it wouldn't go'.  PICC put back in same site. Tm last night was 99.8 compared w/ 101 prior to PICC change.  Pt reports feeling well.  Started Cipro and finished this 1 week ago.   Review of Systems For ROS see HPI     Objective:   Physical Exam  Vitals reviewed. Constitutional: She appears well-developed and well-nourished. No distress.  HENT:  Head: Normocephalic and atraumatic.  Nose: Nose normal.  Mouth/Throat: Oropharynx is clear and moist. No oropharyngeal exudate.       TMs normal bilaterally  Neck: Normal range of motion. Neck supple.  Cardiovascular: Normal rate, regular rhythm, normal heart sounds and intact distal pulses.   Pulmonary/Chest: Effort normal and breath sounds normal. No respiratory distress. She has no wheezes. She has no rales.  Lymphadenopathy:    She has no cervical adenopathy.  Skin: Skin is warm and dry. She is not diaphoretic.       picc site w/out redness or induration Attempted picc site on R WNL w/ exception of large bruise          Assessment & Plan:

## 2011-08-20 ENCOUNTER — Encounter: Payer: Self-pay | Admitting: Internal Medicine

## 2011-08-21 ENCOUNTER — Other Ambulatory Visit: Payer: BC Managed Care – PPO | Admitting: Lab

## 2011-08-21 ENCOUNTER — Other Ambulatory Visit (HOSPITAL_BASED_OUTPATIENT_CLINIC_OR_DEPARTMENT_OTHER): Payer: BC Managed Care – PPO | Admitting: Oncology

## 2011-08-21 ENCOUNTER — Ambulatory Visit (HOSPITAL_BASED_OUTPATIENT_CLINIC_OR_DEPARTMENT_OTHER): Payer: BC Managed Care – PPO

## 2011-08-21 ENCOUNTER — Other Ambulatory Visit: Payer: Self-pay | Admitting: *Deleted

## 2011-08-21 ENCOUNTER — Other Ambulatory Visit: Payer: Self-pay | Admitting: Oncology

## 2011-08-21 VITALS — BP 122/72 | HR 57 | Temp 97.8°F

## 2011-08-21 DIAGNOSIS — I749 Embolism and thrombosis of unspecified artery: Secondary | ICD-10-CM

## 2011-08-21 DIAGNOSIS — D638 Anemia in other chronic diseases classified elsewhere: Secondary | ICD-10-CM

## 2011-08-21 DIAGNOSIS — D689 Coagulation defect, unspecified: Secondary | ICD-10-CM

## 2011-08-21 LAB — CBC WITH DIFFERENTIAL/PLATELET
Basophils Absolute: 0 10*3/uL (ref 0.0–0.1)
EOS%: 9.4 % — ABNORMAL HIGH (ref 0.0–7.0)
Eosinophils Absolute: 0.6 10*3/uL — ABNORMAL HIGH (ref 0.0–0.5)
HCT: 25.9 % — ABNORMAL LOW (ref 34.8–46.6)
HGB: 8.2 g/dL — ABNORMAL LOW (ref 11.6–15.9)
MCH: 26.5 pg (ref 25.1–34.0)
MONO#: 0.5 10*3/uL (ref 0.1–0.9)
NEUT#: 2.4 10*3/uL (ref 1.5–6.5)
NEUT%: 40.1 % (ref 38.4–76.8)
RDW: 15.9 % — ABNORMAL HIGH (ref 11.2–14.5)
WBC: 6 10*3/uL (ref 3.9–10.3)
lymph#: 2.4 10*3/uL (ref 0.9–3.3)

## 2011-08-21 LAB — PROTIME-INR
INR: 2.3 (ref 2.00–3.50)
Protime: 27.6 Seconds — ABNORMAL HIGH (ref 10.6–13.4)

## 2011-08-21 MED ORDER — DARBEPOETIN ALFA-POLYSORBATE 300 MCG/0.6ML IJ SOLN
300.0000 ug | Freq: Once | INTRAMUSCULAR | Status: AC
  Administered 2011-08-21: 300 ug via SUBCUTANEOUS

## 2011-08-21 NOTE — Patient Instructions (Signed)
Pt in for injection.  She states she is doing well.  Injection given and discharge instructions given to call with questions and concerns.  Note given to Dr. Welton Flakes and her RN for PT/INR labs to be loaded for today and all future lab appts.  Pt discharged home ambulatory.

## 2011-08-22 LAB — CULTURE, BLOOD (SINGLE): Organism ID, Bacteria: NO GROWTH

## 2011-08-25 ENCOUNTER — Other Ambulatory Visit: Payer: Self-pay | Admitting: Oncology

## 2011-09-05 ENCOUNTER — Other Ambulatory Visit: Payer: Self-pay | Admitting: Oncology

## 2011-09-06 LAB — HM PAP SMEAR: HM PAP: NORMAL

## 2011-09-06 LAB — HM MAMMOGRAPHY: HM Mammogram: NORMAL

## 2011-09-08 ENCOUNTER — Telehealth: Payer: Self-pay | Admitting: Medical Oncology

## 2011-09-08 NOTE — Telephone Encounter (Signed)
Received call from patient stating that "They called me last week and said my CBC was low."  Returned call to patient and patient stated "[facility] drew my lab work last week and called Friday and said it was low."   Advised patient that if [facility] needed further lab work that they would need to contact Dr. Milta Deiters office.  Patient also asking if PT/INR orders in for next lab visit 5/9.    No orders for PT/INR 5/9  Will review with MD

## 2011-09-10 ENCOUNTER — Encounter: Payer: Self-pay | Admitting: Family Medicine

## 2011-09-10 ENCOUNTER — Ambulatory Visit (INDEPENDENT_AMBULATORY_CARE_PROVIDER_SITE_OTHER): Payer: BC Managed Care – PPO | Admitting: Family Medicine

## 2011-09-10 VITALS — BP 115/72 | HR 84 | Temp 98.8°F | Ht 67.0 in | Wt 157.0 lb

## 2011-09-10 DIAGNOSIS — H109 Unspecified conjunctivitis: Secondary | ICD-10-CM

## 2011-09-10 MED ORDER — POLYMYXIN B-TRIMETHOPRIM 10000-0.1 UNIT/ML-% OP SOLN: OPHTHALMIC | Status: DC

## 2011-09-10 NOTE — Patient Instructions (Signed)
Start the eye drops 3x/day for 7-10 days If no improvement or worsening- call me! Hang in there!

## 2011-09-10 NOTE — Progress Notes (Signed)
  Subjective:    Patient ID: [name] [name], female    DOB: [birth year], 61 y.o.   MRN: [id number]  HPI L eye pain- sxs started 2 weeks ago, thought it was pollen related.  Took tylenol and benadryl w/ mild improvement but sxs persist.  Has redness to lateral corner of L eye.  No problems in the R.  No drainage.  granddaughter w/ pink eye   Review of Systems For ROS see HPI     Objective:   Physical Exam  Vitals reviewed. Constitutional: She appears well-developed and well-nourished. No distress.  HENT:  Head: Normocephalic and atraumatic.  Eyes: EOM are normal. Pupils are equal, round, and reactive to light. Right eye exhibits no discharge. Left eye exhibits no discharge.       L conjunctival injxn, no abrasion or abnormality on fluorescein exam          Assessment & Plan:

## 2011-09-11 ENCOUNTER — Other Ambulatory Visit: Payer: Self-pay | Admitting: *Deleted

## 2011-09-11 DIAGNOSIS — D689 Coagulation defect, unspecified: Secondary | ICD-10-CM

## 2011-09-12 NOTE — Assessment & Plan Note (Signed)
New.  No evidence of corneal abrasion.  Start abx drops.  Reviewed supportive care and red flags that should prompt return.  Pt expressed understanding and is in agreement w/ plan.

## 2011-09-15 ENCOUNTER — Other Ambulatory Visit: Payer: Self-pay | Admitting: Family Medicine

## 2011-09-15 ENCOUNTER — Telehealth: Payer: Self-pay | Admitting: Internal Medicine

## 2011-09-15 ENCOUNTER — Telehealth: Payer: Self-pay | Admitting: *Deleted

## 2011-09-15 DIAGNOSIS — R9389 Abnormal findings on diagnostic imaging of other specified body structures: Secondary | ICD-10-CM

## 2011-09-15 NOTE — Telephone Encounter (Signed)
Message copied by Ovidio Kin on Mon Sep 15, 2011 12:04 PM ------      Message from: [name]      Created: Mon Sep 15, 2011 10:10 AM      Regarding: FW: CT scan?       Pt needs chest CT based on abnormal CT this fall            ----- Message -----         From: Iva Boop, MD         Sent: 04/08/2011  10:05 AM           To: [name], MD      Subject: RE: CT scan?                                             I think she needs the chest CT - I have not been routinely scanning her in past few years            ? If cost issues             Other option which could be cheaper is pulmonary opinion but doubt they would avoid the CT      ----- Message -----         From: [name], MD         Sent: 04/08/2011   9:45 AM           To: Iva Boop, MD      Subject: CT scan?                                                 Based on appearance of chest CT in 9/12 it was recommended to have repeat scan in 6 weeks.  She is feeling well and would like to defer this.  She states she typically has CT abd/pelvis every couple years in the spring w/ you.  If she is due for an upcoming CT, would it be possible to include the lungs to reassess?  Please let me know so I can get back w/ her.            Thanks!      Jae Dire

## 2011-09-15 NOTE — Telephone Encounter (Signed)
I spoke with Aggie Cosier Pharmacist with [facility].  They will provide oral MVI for the patient.  I have advised Ms. Laton she should be hearing from there with recommendations and vitamins this week.

## 2011-09-15 NOTE — Telephone Encounter (Signed)
Called pt to advise that someone will call her about her upcoming CT Scan apt, pt understood and stated the cost will not be an issue, pt also advised to notify MD Tabori that the order placed for her concerning vitamin's is not being currently given again per noted the company is out of the vitamins once again, MD Tabori made aware verbally

## 2011-09-15 NOTE — Telephone Encounter (Signed)
Patient was informed by [facility] that the Infuvite (MVI for her TPN) is on backorder.  They have not ETA at this time.  I looked this up on the [employer] and it is listed as a shortage with no estimation of availability.  Do we need give her oral supplements (or will this help) ?

## 2011-09-15 NOTE — Telephone Encounter (Signed)
Please query the [facility] pharmacist about alternative treatment (oral?) and dosing

## 2011-09-16 ENCOUNTER — Telehealth: Payer: Self-pay | Admitting: *Deleted

## 2011-09-16 ENCOUNTER — Other Ambulatory Visit: Payer: Self-pay | Admitting: Oncology

## 2011-09-16 DIAGNOSIS — R9389 Abnormal findings on diagnostic imaging of other specified body structures: Secondary | ICD-10-CM

## 2011-09-16 NOTE — Telephone Encounter (Signed)
Called pt to advise that she will need to come in for a lab visit to collect BUN and Creatnine per abnormal CT Scan, per pt needs follow up CT Scan, pt advised that she will call back to schedule this lab visit tomorrow per needs to look at her other MD apts, pt aware someone will call her about her CT Scan apt soon

## 2011-09-18 ENCOUNTER — Other Ambulatory Visit (HOSPITAL_BASED_OUTPATIENT_CLINIC_OR_DEPARTMENT_OTHER): Payer: BC Managed Care – PPO | Admitting: Lab

## 2011-09-18 ENCOUNTER — Ambulatory Visit (HOSPITAL_BASED_OUTPATIENT_CLINIC_OR_DEPARTMENT_OTHER): Payer: BC Managed Care – PPO

## 2011-09-18 ENCOUNTER — Telehealth: Payer: Self-pay | Admitting: Medical Oncology

## 2011-09-18 VITALS — BP 107/66 | HR 69 | Temp 97.9°F

## 2011-09-18 DIAGNOSIS — D689 Coagulation defect, unspecified: Secondary | ICD-10-CM

## 2011-09-18 DIAGNOSIS — D638 Anemia in other chronic diseases classified elsewhere: Secondary | ICD-10-CM

## 2011-09-18 LAB — CBC WITH DIFFERENTIAL/PLATELET
BASO%: 0.8 % (ref 0.0–2.0)
Basophils Absolute: 0 10*3/uL (ref 0.0–0.1)
EOS%: 3.7 % (ref 0.0–7.0)
HGB: 8.1 g/dL — ABNORMAL LOW (ref 11.6–15.9)
MCH: 26.2 pg (ref 25.1–34.0)
MCHC: 31.9 g/dL (ref 31.5–36.0)
MCV: 82.2 fL (ref 79.5–101.0)
MONO%: 12.9 % (ref 0.0–14.0)
RDW: 16 % — ABNORMAL HIGH (ref 11.2–14.5)
lymph#: 1.6 10*3/uL (ref 0.9–3.3)

## 2011-09-18 LAB — PROTIME-INR
INR: 2.2 (ref 2.00–3.50)
Protime: 26.4 Seconds — ABNORMAL HIGH (ref 10.6–13.4)

## 2011-09-18 MED ORDER — DARBEPOETIN ALFA-POLYSORBATE 300 MCG/0.6ML IJ SOLN
300.0000 ug | Freq: Once | INTRAMUSCULAR | Status: AC
  Administered 2011-09-18: 300 ug via SUBCUTANEOUS
  Filled 2011-09-18: qty 0.6

## 2011-09-18 MED ORDER — WARFARIN SODIUM 10 MG PO TABS: 10.0000 mg | ORAL_TABLET | Freq: Every day | ORAL | Status: DC

## 2011-09-18 NOTE — Telephone Encounter (Signed)
ok 

## 2011-09-18 NOTE — Telephone Encounter (Signed)
Per MD, notified patient that pt/inr was good and to continue current dose of Coumadin.  Patient requested that prescription be changed to allow her a months supply of Coumadin.  Current prescription only gives her 10 tablets.  Patient expressed understanding.  Will review with MD.

## 2011-09-18 NOTE — Telephone Encounter (Signed)
Message copied by Tylene Fantasia on Thu Sep 18, 2011 12:43 PM ------      Message from: Victorino December      Created: Thu Sep 18, 2011  9:38 AM       Please call patient: pt/inr good, keep same dose of coumadin

## 2011-09-18 NOTE — Telephone Encounter (Signed)
New prescription sent to pharmacy.  Patient made aware of new order.

## 2011-09-22 NOTE — Telephone Encounter (Signed)
Left a vm reminding pt that she needs to contact our office to set up a lab draw for BUN and Creatnine per abnormal CT scan prior to having hte follow up CT Scan.

## 2011-09-29 NOTE — Telephone Encounter (Signed)
Spoke to pt concerning labs, scheduled pt for lab draws on 10-01-11 at 11am for BMP and Cr dx Abnormal CT Scan, pt understood and accepted apt

## 2011-10-01 ENCOUNTER — Other Ambulatory Visit (INDEPENDENT_AMBULATORY_CARE_PROVIDER_SITE_OTHER): Payer: BC Managed Care – PPO

## 2011-10-01 ENCOUNTER — Encounter: Payer: Self-pay | Admitting: *Deleted

## 2011-10-01 DIAGNOSIS — R9389 Abnormal findings on diagnostic imaging of other specified body structures: Secondary | ICD-10-CM

## 2011-10-01 LAB — BASIC METABOLIC PANEL
BUN: 33 mg/dL — ABNORMAL HIGH (ref 6–23)
Calcium: 8.8 mg/dL (ref 8.4–10.5)
GFR: 54.74 mL/min — ABNORMAL LOW (ref >60.00)
Glucose, Bld: 104 mg/dL — ABNORMAL HIGH (ref 70–99)
Sodium: 135 mEq/L (ref 135–145)

## 2011-10-01 NOTE — Progress Notes (Signed)
LABS ONLY  

## 2011-10-02 ENCOUNTER — Encounter: Payer: Self-pay | Admitting: Internal Medicine

## 2011-10-02 ENCOUNTER — Telehealth: Payer: Self-pay | Admitting: *Deleted

## 2011-10-02 ENCOUNTER — Telehealth: Payer: Self-pay

## 2011-10-02 NOTE — Telephone Encounter (Signed)
CBC was likely ordered by heme-onc.  Please make sure they are aware of her hemoglobin

## 2011-10-02 NOTE — Telephone Encounter (Signed)
Received call from [facility] labs today BUN 38, hemoglobin 7.6.  I called and spoke with the patient.  She reports she is tired all the time, eating ice, but otherwise ok.  BUN drawn yesterday with Dr. Beverely Low 33.  Discussed all the above with Dr. Leone Payor.  Per Dr. Leone Payor add ferritin and B12 level to the current labs.  If low will need to forward to Dr Sunday Corn and set up follow up with Dr. Leone Payor for one day next week.  I spoke with [facility] and they will add on the labs.  Will review tomorrow.

## 2011-10-02 NOTE — Telephone Encounter (Signed)
Call from [facility] stating they received a call for critical labs with the following results: BUN 38 and Hemoglobin 7.6. Results will be faxed to our office once they receive results. Per GI labs were drawn by [facility].

## 2011-10-03 ENCOUNTER — Telehealth: Payer: Self-pay | Admitting: *Deleted

## 2011-10-03 ENCOUNTER — Telehealth: Payer: Self-pay

## 2011-10-03 NOTE — Telephone Encounter (Signed)
Labs drawn by [facility] and were received by GI office and forwarded to PCP. PCP wanted panic results called to Dr. Welton Flakes. Hgb is not far from her usual range. On monthly Aranesp with last dose 09/18/11.

## 2011-10-03 NOTE — Telephone Encounter (Addendum)
Ferritin and B-12 levels came back from [facility] today, both in range.  Per Lavonna Rua we will show the results to Dr. Leone Payor 10/07/11 and he will decide if she needs to come in next week.  Contacted pt and informed her of this and she was fine with this plan.  She said to let Dr. Leone Payor know that her next Aranesp is 10/16/11.

## 2011-10-03 NOTE — Telephone Encounter (Signed)
Received remainding lab results in office via fax, faxed to MD [facility] office

## 2011-10-03 NOTE — Telephone Encounter (Deleted)
Aranesp is due 10/16/11.

## 2011-10-03 NOTE — Telephone Encounter (Signed)
Called oncology MD listed in chart Drue Second MD to advise critical lab results for pt BUN 38 and Hemoglobin 7.6 , was advised nurses are in a meeting and spoke to susan with triage whom advised that she will pass on the results to the oncologist MD to address, advised that we have not received the fax at this moment for the rest of the labs but when we do we will call fax them, susan understood.

## 2011-10-07 ENCOUNTER — Ambulatory Visit: Payer: BC Managed Care – PPO | Admitting: Lab

## 2011-10-07 ENCOUNTER — Other Ambulatory Visit: Payer: Self-pay | Admitting: *Deleted

## 2011-10-07 ENCOUNTER — Telehealth: Payer: Self-pay | Admitting: *Deleted

## 2011-10-07 ENCOUNTER — Encounter: Payer: Self-pay | Admitting: *Deleted

## 2011-10-07 ENCOUNTER — Encounter: Payer: Self-pay | Admitting: Oncology

## 2011-10-07 ENCOUNTER — Ambulatory Visit (HOSPITAL_BASED_OUTPATIENT_CLINIC_OR_DEPARTMENT_OTHER): Payer: BC Managed Care – PPO | Admitting: Lab

## 2011-10-07 ENCOUNTER — Ambulatory Visit (HOSPITAL_BASED_OUTPATIENT_CLINIC_OR_DEPARTMENT_OTHER): Payer: BC Managed Care – PPO | Admitting: Oncology

## 2011-10-07 ENCOUNTER — Encounter (HOSPITAL_COMMUNITY)
Admission: RE | Admit: 2011-10-07 | Discharge: 2011-10-07 | Disposition: A | Discharge status: Home / Self Care | Payer: BC Managed Care – PPO | Source: Ambulatory Visit | Attending: Oncology | Admitting: Oncology

## 2011-10-07 VITALS — BP 119/71 | HR 91 | Temp 99.7°F | Ht 67.0 in | Wt 155.7 lb

## 2011-10-07 DIAGNOSIS — D649 Anemia, unspecified: Secondary | ICD-10-CM

## 2011-10-07 DIAGNOSIS — Z7901 Long term (current) use of anticoagulants: Secondary | ICD-10-CM

## 2011-10-07 DIAGNOSIS — I749 Embolism and thrombosis of unspecified artery: Secondary | ICD-10-CM

## 2011-10-07 DIAGNOSIS — D689 Coagulation defect, unspecified: Secondary | ICD-10-CM

## 2011-10-07 DIAGNOSIS — D638 Anemia in other chronic diseases classified elsewhere: Secondary | ICD-10-CM

## 2011-10-07 DIAGNOSIS — D61818 Other pancytopenia: Secondary | ICD-10-CM

## 2011-10-07 DIAGNOSIS — M810 Age-related osteoporosis without current pathological fracture: Secondary | ICD-10-CM

## 2011-10-07 LAB — CBC WITH DIFFERENTIAL/PLATELET
BASO%: 0.6 % (ref 0.0–2.0)
EOS%: 2.3 % (ref 0.0–7.0)
Eosinophils Absolute: 0.1 10*3/uL (ref 0.0–0.5)
MCHC: 32 g/dL (ref 31.5–36.0)
MCV: 82.1 fL (ref 79.5–101.0)
MONO%: 10.8 % (ref 0.0–14.0)
NEUT#: 2.3 10*3/uL (ref 1.5–6.5)
RBC: 2.91 10*6/uL — ABNORMAL LOW (ref 3.70–5.45)
RDW: 16.1 % — ABNORMAL HIGH (ref 11.2–14.5)
nRBC: 0 % (ref 0–0)

## 2011-10-07 LAB — COMPREHENSIVE METABOLIC PANEL
Albumin: 3.1 g/dL — ABNORMAL LOW (ref 3.5–5.2)
Alkaline Phosphatase: 111 U/L (ref 39–117)
CO2: 23 mEq/L (ref 19–32)
Glucose, Bld: 83 mg/dL (ref 70–99)
Potassium: 4.3 mEq/L (ref 3.5–5.3)
Sodium: 132 mEq/L — ABNORMAL LOW (ref 135–145)
Total Protein: 8.5 g/dL — ABNORMAL HIGH (ref 6.0–8.3)

## 2011-10-07 LAB — ABO/RH: ABO/RH(D): B POS

## 2011-10-07 NOTE — Telephone Encounter (Signed)
Attempted call 10/03/11 to notify pt MD visit on 10/07/11 at 2pm. LMOVM to call back.  05/28- Called  pt , confirmed she will be here at 2pm for office visit today,

## 2011-10-07 NOTE — Telephone Encounter (Signed)
Orders placed for pt to have transfusion 05/29 at short stay. S/w Geraldine Contras, RN at short stay who advised Orders are visible.

## 2011-10-07 NOTE — Telephone Encounter (Signed)
placed patient on the walk in list for the lab for type and cross getting blood on 10-08-2011 in short stay at 1:00pm 

## 2011-10-07 NOTE — Telephone Encounter (Signed)
placed patient on the walk in list for the lab for type and cross getting blood on 10-08-2011 in short stay at 1:00pm

## 2011-10-07 NOTE — Patient Instructions (Signed)
1. Check B12/folate/iron studies.  2. Blood transfusion on 10/08/11

## 2011-10-07 NOTE — Progress Notes (Signed)
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  Patient also has anemia of chronic disease and has been receiving Aranesp.  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  CURRENT THERAPY:   1. Coumadin to try to maintain an INR between 2.5 to 3.0. 2. Aranesp every 4 weeks to keep hematocrit at or above 36%.  INTERVAL HISTORY:  Patient is seen in followup today. Is seen today as she is developing worsening pancytopenia. Today her white count is 3.5 hemoglobin is 7.6 this is the lowest I have seen in quite some time. Platelets are also low at 94,000. Patient is tired fatigued and weak but has not had any bleeding problems. She does get short winded especially on exertion. She has no nausea or vomiting no fevers or chills. Patient does tell me that of late she there has been significant shortage for her supplements for her chronic TNA. Majority of them multivitamins and B12 and folate folic acid have not been given to the patient because of the shortage. And she thinks that this may be the reason why she has been so fatigued. There have been even times when she has not received lipids. 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  CURRENT MEDICATIONS:   1. Coumadin 12 mg daily or as directed. 2. Vitamin D 50,000 units daily. 3. Lomotil 3 times a day p.r.n.  4. Phenergan 25 mg as needed. 5. Cipro 500 mg twice a day as needed. 6. Benadryl as needed. 7. TUMS 4 tabs every day.  PHYSICAL EXAM:  Patient is awake, alert, in no acute distress.  She appears relatively well.  Vital signs:  Blood pressure 119/71, pulse 91, temperature 99.7 F (37.6 C), temperature source Oral, height 5\' 7"  (1.702 m),  weight 155 lb 11.2 oz (70.625 kg).    HEENT exam:  EOMI.  PERRLA.  Sclerae are anicteric.  No conjunctival pallor.  Oral mucosa is moist.  Neck is supple.  Poor oral dental hygiene.  Lungs:  Clear bilaterally to auscultation.  Cardiovascular:  Regular rate and rhythm.  Abdomen:  Soft, nontender, nondistended.  Bowel sounds are present, no hepatosplenomegaly.  Extremities:  No edema.  Neuro is nonfocal.  LABORATORY DATA:   Lab Results  Component Value Date   WBC 3.5* 10/07/2011   HGB 7.6* 10/07/2011   HCT 23.9* 10/07/2011   MCV 82.1 10/07/2011   PLT 94* 10/07/2011    IMPRESSION:  [name] with -  1. 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  Patient is on Coumadin.   2. Anemia of chronic disease Patient now with worsening pancytopenia unclear etiology. 3. Patient is on long-term TNA  PLAN: #1 we will check iron studies B12 and folate levels.  #2 since patient is symptomatic from her anemia we will transfuse 2 units of packed red cells tomorrow in short stay orders have been placed and appendectomy.  #3 patient may eventually need bone marrow biopsy and aspirate to further investigate the pancytopenia.  #4 all of this was explained to the patient and her daughter.  I spent 30 minutes face to face with the patient and her daughter counseling and coordinating care. Were answered.

## 2011-10-08 ENCOUNTER — Ambulatory Visit (HOSPITAL_COMMUNITY)
Admission: RE | Admit: 2011-10-08 | Discharge: 2011-10-08 | Disposition: A | Discharge status: Home / Self Care | Payer: BC Managed Care – PPO | Source: Ambulatory Visit | Attending: Oncology | Admitting: Oncology

## 2011-10-08 ENCOUNTER — Encounter (HOSPITAL_COMMUNITY): Payer: Self-pay

## 2011-10-08 VITALS — BP 110/67 | HR 66 | Temp 99.3°F | Resp 16

## 2011-10-08 DIAGNOSIS — D649 Anemia, unspecified: Secondary | ICD-10-CM | POA: Insufficient documentation

## 2011-10-08 LAB — PREPARE RBC (CROSSMATCH)

## 2011-10-08 MED ORDER — HEPARIN SOD (PORK) LOCK FLUSH 100 UNIT/ML IV SOLN
250.0000 [IU] | INTRAVENOUS | Status: AC | PRN
  Administered 2011-10-08: 250 [IU]

## 2011-10-08 MED ORDER — SODIUM CHLORIDE 0.9 % IJ SOLN: 3.0000 mL | INTRAMUSCULAR | Status: DC | PRN

## 2011-10-08 MED ORDER — DIPHENHYDRAMINE HCL 25 MG PO CAPS
ORAL_CAPSULE | ORAL | Status: AC
  Filled 2011-10-08: qty 1

## 2011-10-08 MED ORDER — ACETAMINOPHEN 325 MG PO TABS
650.0000 mg | ORAL_TABLET | Freq: Once | ORAL | Status: AC
  Administered 2011-10-08: 650 mg via ORAL

## 2011-10-08 MED ORDER — ACETAMINOPHEN 325 MG PO TABS
ORAL_TABLET | ORAL | Status: AC
  Filled 2011-10-08: qty 2

## 2011-10-08 MED ORDER — SODIUM CHLORIDE 0.9 % IV SOLN: 250.0000 mL | Freq: Once | INTRAVENOUS | Status: DC

## 2011-10-08 MED ORDER — DIPHENHYDRAMINE HCL 25 MG PO CAPS
25.0000 mg | ORAL_CAPSULE | Freq: Once | ORAL | Status: AC
  Administered 2011-10-08: 25 mg via ORAL

## 2011-10-08 MED ORDER — HEPARIN SOD (PORK) LOCK FLUSH 100 UNIT/ML IV SOLN: 500.0000 [IU] | Freq: Every day | INTRAVENOUS | Status: DC | PRN

## 2011-10-08 MED ORDER — HEPARIN SOD (PORK) LOCK FLUSH 100 UNIT/ML IV SOLN
INTRAVENOUS | Status: AC
  Administered 2011-10-08: 250 [IU]
  Filled 2011-10-08: qty 5

## 2011-10-08 MED ORDER — SODIUM CHLORIDE 0.9 % IJ SOLN: 10.0000 mL | INTRAMUSCULAR | Status: DC | PRN

## 2011-10-08 NOTE — Telephone Encounter (Signed)
I called her yesterday. To get transfusion today.  Will follow-up after that. Has had more labs drawn by Dr. Welton Flakes

## 2011-10-09 LAB — TYPE AND SCREEN
ABO/RH(D): B POS
Antibody Screen: NEGATIVE
Unit division: 0
Unit division: 0

## 2011-10-10 ENCOUNTER — Telehealth: Payer: Self-pay | Admitting: Family Medicine

## 2011-10-10 ENCOUNTER — Ambulatory Visit (INDEPENDENT_AMBULATORY_CARE_PROVIDER_SITE_OTHER): Payer: BC Managed Care – PPO | Admitting: Family Medicine

## 2011-10-10 ENCOUNTER — Encounter: Payer: Self-pay | Admitting: Family Medicine

## 2011-10-10 VITALS — BP 108/78 | HR 76 | Temp 98.9°F | Ht 67.75 in | Wt 156.4 lb

## 2011-10-10 DIAGNOSIS — N390 Urinary tract infection, site not specified: Secondary | ICD-10-CM

## 2011-10-10 DIAGNOSIS — R109 Unspecified abdominal pain: Secondary | ICD-10-CM

## 2011-10-10 LAB — POCT URINALYSIS DIPSTICK

## 2011-10-10 MED ORDER — PREDNISONE 50 MG PO TABS: ORAL_TABLET | ORAL | Status: AC

## 2011-10-10 MED ORDER — SULFAMETHOXAZOLE-TRIMETHOPRIM 200-40 MG/5ML PO SUSP: 20.0000 mL | Freq: Two times a day (BID) | ORAL | Status: DC

## 2011-10-10 NOTE — Telephone Encounter (Signed)
FYI-noted elective cases for medication to be sent as Prednisone 50mg  PO 13hours, 7hours and 1 hour prior to study, sent all to pt pharmacy

## 2011-10-10 NOTE — Progress Notes (Signed)
  Subjective:    Patient ID: [name] [name], female    DOB: March 19, 1951, 61 y.o.   MRN: [id number]  HPI UTI- dysuria, foul smelling odor, back pain.  sxs started 5 days ago.  Increased fluid intake in hopes of flushing system w/out relief.  Tm 100.? Last night.  Had night sweats.  No temp today.  Having suprapubic pain.  + frequency, hesitancy.  Took AZO to help w/ burning.  Hx of similar.  Allergic to PCN.  Has been off cipro x2 weeks.  Had blood transfusion on 5/29.   Review of Systems For ROS see HPI     Objective:   Physical Exam  Vitals reviewed. Constitutional: She appears well-developed and well-nourished. No distress.  Abdominal: Soft. She exhibits no distension. There is no tenderness (no suprapubic or CVA tenderness).          Assessment & Plan:

## 2011-10-10 NOTE — Patient Instructions (Signed)
Start the bactrim as directed Drink LOTS of water Call if no improvement or at all worsening Hang in there!!!

## 2011-10-10 NOTE — Telephone Encounter (Signed)
In reference to patient's upcoming appointment for follow up ct chest, noted that patient has contrast allergy.  Please send rx to [facility] at [address] per patient request.  Patient is aware that rx to be sent & aware of her appointment.

## 2011-10-10 NOTE — Telephone Encounter (Signed)
Please send script for pre-medication protocol

## 2011-10-12 NOTE — Assessment & Plan Note (Signed)
New.  Pt's sxs consistent w/ infxn.  UA unreliable due to AZO.  Start abx.  Await urine cx.  Reviewed supportive care and red flags that should prompt return.  Pt expressed understanding and is in agreement w/ plan.

## 2011-10-12 NOTE — Telephone Encounter (Signed)
Does she also need benadryl or just the prednisone?

## 2011-10-13 NOTE — Telephone Encounter (Signed)
Please call pt and let her know

## 2011-10-13 NOTE — Telephone Encounter (Signed)
She does also need the Benadryl, but if she has it in her home already, she can save money by taking it instead of buying again at pharmacy.  As long as she takes 50mg  1 hour prior to study.

## 2011-10-13 NOTE — Telephone Encounter (Signed)
Advised pt to take Prednisone 50mg  PO 1 tab 13 hours prior to CT SCAN, 7 hours prior to CT SCAN and 1 hour prior to CT SCAN as well as 50mg  of benedryl 1 hour prior to CT SCAN, pt understood all instructions and will pick up the RX for prednisone sent to pharmacy and take OTC benedryl as directed

## 2011-10-15 ENCOUNTER — Encounter: Payer: Self-pay | Admitting: Internal Medicine

## 2011-10-15 ENCOUNTER — Other Ambulatory Visit: Payer: Self-pay | Admitting: Internal Medicine

## 2011-10-16 ENCOUNTER — Ambulatory Visit (HOSPITAL_BASED_OUTPATIENT_CLINIC_OR_DEPARTMENT_OTHER): Payer: BC Managed Care – PPO

## 2011-10-16 ENCOUNTER — Other Ambulatory Visit: Payer: Self-pay | Admitting: Medical Oncology

## 2011-10-16 ENCOUNTER — Other Ambulatory Visit: Payer: Self-pay

## 2011-10-16 ENCOUNTER — Other Ambulatory Visit (HOSPITAL_BASED_OUTPATIENT_CLINIC_OR_DEPARTMENT_OTHER): Payer: BC Managed Care – PPO | Admitting: Lab

## 2011-10-16 VITALS — BP 106/65 | HR 78 | Temp 99.3°F

## 2011-10-16 DIAGNOSIS — D649 Anemia, unspecified: Secondary | ICD-10-CM

## 2011-10-16 DIAGNOSIS — D638 Anemia in other chronic diseases classified elsewhere: Secondary | ICD-10-CM

## 2011-10-16 DIAGNOSIS — I749 Embolism and thrombosis of unspecified artery: Secondary | ICD-10-CM

## 2011-10-16 LAB — CULTURE, URINE COMPREHENSIVE: Colony Count: 40000

## 2011-10-16 LAB — CBC WITH DIFFERENTIAL/PLATELET
BASO%: 0.5 % (ref 0.0–2.0)
Eosinophils Absolute: 0.1 10*3/uL (ref 0.0–0.5)
LYMPH%: 32.5 % (ref 14.0–49.7)
MCHC: 32 g/dL (ref 31.5–36.0)
MONO#: 0.5 10*3/uL (ref 0.1–0.9)
NEUT#: 2 10*3/uL (ref 1.5–6.5)
Platelets: 78 10*3/uL — ABNORMAL LOW (ref 145–400)
RBC: 3.27 10*6/uL — ABNORMAL LOW (ref 3.70–5.45)
RDW: 15.4 % — ABNORMAL HIGH (ref 11.2–14.5)
WBC: 3.8 10*3/uL — ABNORMAL LOW (ref 3.9–10.3)
lymph#: 1.2 10*3/uL (ref 0.9–3.3)
nRBC: 0 % (ref 0–0)

## 2011-10-16 LAB — PROTIME-INR
INR: 3.2 (ref 2.00–3.50)
Protime: 38.4 Seconds — ABNORMAL HIGH (ref 10.6–13.4)

## 2011-10-16 MED ORDER — DARBEPOETIN ALFA-POLYSORBATE 300 MCG/0.6ML IJ SOLN
300.0000 ug | Freq: Once | INTRAMUSCULAR | Status: AC
  Administered 2011-10-16: 300 ug via SUBCUTANEOUS
  Filled 2011-10-16: qty 0.6

## 2011-10-16 NOTE — Progress Notes (Signed)
Quick Note:  Please have her do hemoccult cards (guaiacs) re anemia ______

## 2011-10-17 ENCOUNTER — Other Ambulatory Visit: Payer: BC Managed Care – PPO

## 2011-10-24 ENCOUNTER — Ambulatory Visit (INDEPENDENT_AMBULATORY_CARE_PROVIDER_SITE_OTHER)
Admission: RE | Admit: 2011-10-24 | Discharge: 2011-10-24 | Disposition: A | Discharge status: Home / Self Care | Payer: BC Managed Care – PPO | Source: Ambulatory Visit | Attending: Family Medicine | Admitting: Family Medicine

## 2011-10-24 ENCOUNTER — Telehealth: Payer: Self-pay | Admitting: *Deleted

## 2011-10-24 DIAGNOSIS — R161 Splenomegaly, not elsewhere classified: Secondary | ICD-10-CM

## 2011-10-24 DIAGNOSIS — R9389 Abnormal findings on diagnostic imaging of other specified body structures: Secondary | ICD-10-CM

## 2011-10-24 DIAGNOSIS — K766 Portal hypertension: Secondary | ICD-10-CM

## 2011-10-24 MED ORDER — IOHEXOL 300 MG/ML  SOLN
80.0000 mL | Freq: Once | INTRAMUSCULAR | Status: AC | PRN
  Administered 2011-10-24: 80 mL via INTRAVENOUS

## 2011-10-24 NOTE — Telephone Encounter (Signed)
Pt notified and wishes to proceed with u/s. Per verbal from Provider, Dr Leone Payor also agrees and would like to include liver vessel doppler (portal doppler) to r/o portal HTN.  Spoke to vascular lab and ultrasound. Vascular lab states we should contact cardiopulmonary for further instruction on correct test to order for liver doppler. Called pager # [phone], if no response today we can call the doppler lab on Monday at [phone].

## 2011-10-24 NOTE — Telephone Encounter (Signed)
Message copied by Kathi Simpers on Fri Oct 24, 2011  1:22 PM ------      Message from: [name]      Created: Fri Oct 24, 2011 11:55 AM       Pt's lymph nodes and pulmonary nodules have improved- this is great news!      Limited view of spleen shows enlargement.  Based on this, needs abd [place] (dx splenomegaly)

## 2011-10-27 ENCOUNTER — Other Ambulatory Visit: Payer: Self-pay

## 2011-10-27 NOTE — Telephone Encounter (Signed)
Spoke to gessner MD nurse sherry whom advised the test to be ordered is the [facility] 2191 for Abdominal Doppler with [place] limited, and this is performed at GI, advised MD Tabori about verbal progress for order

## 2011-10-27 NOTE — Telephone Encounter (Addendum)
Left vm for [facility] office [phone] option 1 to advise the order for IM[facility] 2191 is not in the system to be placed, could she please advise how to put order in the system any other way, sherry returned call, order was placed in chart, referral rep notified to contact pt when apt is available

## 2011-10-28 NOTE — Telephone Encounter (Signed)
Correct orders have been placed in chart and pt aware of apt tomorrow via referral rep., MD Tabori aware verbally

## 2011-10-29 ENCOUNTER — Ambulatory Visit
Admission: RE | Admit: 2011-10-29 | Discharge: 2011-10-29 | Disposition: A | Discharge status: Home / Self Care | Payer: BC Managed Care – PPO | Source: Ambulatory Visit | Attending: Family Medicine | Admitting: Family Medicine

## 2011-10-29 DIAGNOSIS — R161 Splenomegaly, not elsewhere classified: Secondary | ICD-10-CM

## 2011-10-29 DIAGNOSIS — K766 Portal hypertension: Secondary | ICD-10-CM

## 2011-10-30 ENCOUNTER — Encounter: Payer: Self-pay | Admitting: *Deleted

## 2011-11-03 ENCOUNTER — Other Ambulatory Visit (INDEPENDENT_AMBULATORY_CARE_PROVIDER_SITE_OTHER): Payer: BC Managed Care – PPO

## 2011-11-03 DIAGNOSIS — D649 Anemia, unspecified: Secondary | ICD-10-CM

## 2011-11-03 LAB — HEMOCCULT SLIDES (X 3 CARDS): OCCULT 1: NEGATIVE

## 2011-11-05 NOTE — Progress Notes (Signed)
Quick Note:  Will see her in office ______

## 2011-11-05 NOTE — Progress Notes (Signed)
Quick Note:  No blood in stools - good news However - US doppler showed enlarged spleen and some blood vessel changes that may represent some liver issues and be related to her anemia  I would like to see her in the office in July ______

## 2011-11-10 ENCOUNTER — Other Ambulatory Visit: Payer: Self-pay | Admitting: Internal Medicine

## 2011-11-10 NOTE — Telephone Encounter (Signed)
Lavonna Rua do you think we can refill her Cipro, she has an appointment with Dr. Leone Payor 12/01/11.  She takes it in cycles for bacterial overgrowth from what the notes say.  Please advise.  Thanks.

## 2011-11-14 ENCOUNTER — Telehealth: Payer: Self-pay | Admitting: Medical Oncology

## 2011-11-14 ENCOUNTER — Encounter (HOSPITAL_COMMUNITY)
Admission: RE | Admit: 2011-11-14 | Discharge: 2011-11-14 | Disposition: A | Discharge status: Home / Self Care | Payer: BC Managed Care – PPO | Source: Ambulatory Visit | Attending: Oncology | Admitting: Oncology

## 2011-11-14 ENCOUNTER — Ambulatory Visit (HOSPITAL_BASED_OUTPATIENT_CLINIC_OR_DEPARTMENT_OTHER): Payer: BC Managed Care – PPO

## 2011-11-14 ENCOUNTER — Other Ambulatory Visit: Payer: Self-pay | Admitting: Medical Oncology

## 2011-11-14 ENCOUNTER — Ambulatory Visit: Payer: BC Managed Care – PPO

## 2011-11-14 ENCOUNTER — Other Ambulatory Visit (HOSPITAL_BASED_OUTPATIENT_CLINIC_OR_DEPARTMENT_OTHER): Payer: BC Managed Care – PPO

## 2011-11-14 VITALS — BP 99/72 | HR 75 | Temp 97.4°F

## 2011-11-14 DIAGNOSIS — D649 Anemia, unspecified: Secondary | ICD-10-CM | POA: Insufficient documentation

## 2011-11-14 DIAGNOSIS — D638 Anemia in other chronic diseases classified elsewhere: Secondary | ICD-10-CM

## 2011-11-14 LAB — FERRITIN: Ferritin: 289 ng/mL (ref 10–291)

## 2011-11-14 LAB — CBC WITH DIFFERENTIAL/PLATELET
Basophils Absolute: 0 10*3/uL (ref 0.0–0.1)
Eosinophils Absolute: 0.1 10*3/uL (ref 0.0–0.5)
HGB: 8 g/dL — ABNORMAL LOW (ref 11.6–15.9)
LYMPH%: 33 % (ref 14.0–49.7)
MCV: 82.2 fL (ref 79.5–101.0)
MONO#: 0.3 10*3/uL (ref 0.1–0.9)
MONO%: 8.9 % (ref 0.0–14.0)
NEUT#: 1.8 10*3/uL (ref 1.5–6.5)
Platelets: 82 10*3/uL — ABNORMAL LOW (ref 145–400)
RBC: 3.01 10*6/uL — ABNORMAL LOW (ref 3.70–5.45)
WBC: 3.4 10*3/uL — ABNORMAL LOW (ref 3.9–10.3)

## 2011-11-14 LAB — IRON AND TIBC
%SAT: 11 % — ABNORMAL LOW (ref 20–55)
Iron: 33 ug/dL — ABNORMAL LOW (ref 42–145)
UIBC: 277 ug/dL (ref 125–400)

## 2011-11-14 LAB — FOLATE: Folate: >20 ng/mL

## 2011-11-14 LAB — PROTIME-INR

## 2011-11-14 MED ORDER — DARBEPOETIN ALFA-POLYSORBATE 300 MCG/0.6ML IJ SOLN
300.0000 ug | Freq: Once | INTRAMUSCULAR | Status: AC
  Administered 2011-11-14: 300 ug via SUBCUTANEOUS
  Filled 2011-11-14: qty 0.6

## 2011-11-14 NOTE — Telephone Encounter (Signed)
Spoke with patient requesting she return to clinic to have type and cross blood work drawn, informed her of appointment 7/6 to receive two units of packed red blood.  Patient expressed understanding, no further questions at this time.   Lab appointment 7/5 @ 1:15. Blood: 7/6 @ 8.

## 2011-11-14 NOTE — Telephone Encounter (Signed)
Message copied by Tylene Fantasia on Fri Nov 14, 2011 11:54 AM ------      Message from: Victorino December      Created: Fri Nov 14, 2011 11:27 AM       Please call patient: INR ok keep same dose of coumadin, inquire as to how she is doing, does she have SOB or any other symptoms from anemia. If yes she will need a transfusion

## 2011-11-14 NOTE — Telephone Encounter (Signed)
Per MD, INR ok please keep same dose of coumadin.  Patient states she is tired, "pooped"  Asked patient if she felt she would benefit from receiving some blood, "I don't think it would hurt, probably would be a good idea.  I don't know if Dr. Welton Flakes is aware but Dr. 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  He said that could be contributing to my anemia."  Instructed patient that I would let Dr. Welton Flakes know and return a call to her with Dr. Milta Deiters recommendations.  Patient expressed understanding, no further questions at this time.

## 2011-11-15 ENCOUNTER — Ambulatory Visit (HOSPITAL_BASED_OUTPATIENT_CLINIC_OR_DEPARTMENT_OTHER): Payer: BC Managed Care – PPO

## 2011-11-15 DIAGNOSIS — D649 Anemia, unspecified: Secondary | ICD-10-CM

## 2011-11-15 MED ORDER — SODIUM CHLORIDE 0.9 % IJ SOLN
3.0000 mL | INTRAMUSCULAR | Status: AC | PRN
  Administered 2011-11-15: 3 mL
  Filled 2011-11-15: qty 10

## 2011-11-15 MED ORDER — DIPHENHYDRAMINE HCL 25 MG PO CAPS
25.0000 mg | ORAL_CAPSULE | Freq: Once | ORAL | Status: AC
  Administered 2011-11-15: 25 mg via ORAL

## 2011-11-15 MED ORDER — ACETAMINOPHEN 325 MG PO TABS
650.0000 mg | ORAL_TABLET | Freq: Once | ORAL | Status: AC
  Administered 2011-11-15: 650 mg via ORAL

## 2011-11-15 MED ORDER — SODIUM CHLORIDE 0.9 % IV SOLN
250.0000 mL | Freq: Once | INTRAVENOUS | Status: AC
  Administered 2011-11-15: 250 mL via INTRAVENOUS

## 2011-11-15 NOTE — Patient Instructions (Addendum)
Blood Products Information This is information about transfusions of blood products. All blood that is to be transfused is tested for blood type, compatibility with the recipient, and for infections. Except in emergencies, giving a transfusion requires a written consent. Blood transfusions are often given as packed red blood cells. This means the other parts of the blood have been taken out. Blood may be needed to treat severe anemia or bleeding. Other blood products include plasma, platelets, immune globulin, and cryoprecipitate. Blood for transfusion is mostly donated by volunteers. The blood donors are carefully screened for risk factors that could cause disease. Donors are all tested for infections that could be transmitted by blood. The blood product supply today is the safest it has ever been. Some risks do remain.  A minor reaction with fever, chills, or rash happens in about 1% of blood product transfusions.   Life-threatening reactions occur in less than 1 in a million transfusions.   Infection with germs (bacteria), viruses or parasites like malaria can still happen. The risk is very low.   Hepatitis B occurs in about 1 case in 150,000 transfusions.   Hepatitis C is seen once in 500,000.   HIV is transmitted less than once every million transfusions.  When you receive a transfusion of packed red blood cells, your blood is tested for blood group and Rh type. Your blood is also screened for antibodies that could cause a serious reaction. A cross-match test is done to make sure the blood is safe to give.  Talk with your caregiver if you have any concerns about receiving a transfusion of blood products. Make sure your questions are answered. Transfusions are not given if your caregiver feels the risk is greater than the need. Document Released: 04/28/2005 Document Revised: 04/17/2011 Document Reviewed: 10/16/2006 Exit[employer] Patient Information 2012 [employer]. 

## 2011-11-16 LAB — TYPE AND SCREEN
ABO/RH(D): B POS
Antibody Screen: NEGATIVE
Unit division: 0
Unit division: 0

## 2011-11-17 ENCOUNTER — Encounter: Payer: Self-pay | Admitting: Family Medicine

## 2011-11-17 ENCOUNTER — Ambulatory Visit (INDEPENDENT_AMBULATORY_CARE_PROVIDER_SITE_OTHER): Payer: BC Managed Care – PPO | Admitting: Family Medicine

## 2011-11-17 VITALS — BP 130/72 | HR 85 | Temp 99.0°F | Ht 67.75 in | Wt 157.6 lb

## 2011-11-17 DIAGNOSIS — H6990 Unspecified Eustachian tube disorder, unspecified ear: Secondary | ICD-10-CM | POA: Insufficient documentation

## 2011-11-17 DIAGNOSIS — H6980 Other specified disorders of Eustachian tube, unspecified ear: Secondary | ICD-10-CM | POA: Insufficient documentation

## 2011-11-17 NOTE — Progress Notes (Signed)
  Subjective:    Patient ID: [name] [name], female    DOB: 1951-04-27, 61 y.o.   MRN: [id number]  HPI R ear pain- pt reports sxs started a couple of weeks ago as [place]  'it's really aggravating'.  + nausea.  Some HA.  No drainage from ear.  No known sick contacts.  Currently on Cipro for gut bacteria.   Review of Systems For ROS see HPI     Objective:   Physical Exam  Vitals reviewed. Constitutional: She appears well-developed and well-nourished. No distress.  HENT:  Head: Normocephalic and atraumatic.  Right Ear: Tympanic membrane is retracted.  Left Ear: Tympanic membrane normal.  Nose: Mucosal edema and rhinorrhea present. Right sinus exhibits no maxillary sinus tenderness and no frontal sinus tenderness. Left sinus exhibits no maxillary sinus tenderness and no frontal sinus tenderness.  Mouth/Throat: Mucous membranes are normal. Posterior oropharyngeal erythema (w/ PND) present.  Eyes: Conjunctivae and EOM are normal. Pupils are equal, round, and reactive to light.  Neck: Normal range of motion. Neck supple.  Cardiovascular: Normal rate, regular rhythm and normal heart sounds.   Pulmonary/Chest: Effort normal and breath sounds normal. No respiratory distress. She has no wheezes. She has no rales.  Lymphadenopathy:    She has no cervical adenopathy.          Assessment & Plan:

## 2011-11-17 NOTE — Patient Instructions (Addendum)
This appears to be eustachian tube dysfunction (ear clogged due to sinus congestion) Start OTC sudafed or store brand equivalent for 3-5 days Drink plenty of water REST! Hang in there!!!

## 2011-11-28 ENCOUNTER — Encounter: Payer: Self-pay | Admitting: *Deleted

## 2011-12-01 ENCOUNTER — Ambulatory Visit (INDEPENDENT_AMBULATORY_CARE_PROVIDER_SITE_OTHER): Payer: BC Managed Care – PPO | Admitting: Internal Medicine

## 2011-12-01 ENCOUNTER — Encounter: Payer: Self-pay | Admitting: Internal Medicine

## 2011-12-01 VITALS — BP 82/50 | HR 96 | Temp 99.2°F | Ht 67.75 in | Wt 156.2 lb

## 2011-12-01 DIAGNOSIS — K912 Postsurgical malabsorption, not elsewhere classified: Secondary | ICD-10-CM

## 2011-12-01 DIAGNOSIS — R630 Anorexia: Secondary | ICD-10-CM

## 2011-12-01 DIAGNOSIS — D649 Anemia, unspecified: Secondary | ICD-10-CM

## 2011-12-01 NOTE — Patient Instructions (Addendum)
Dr. Leone Payor will be in touch once he reviews your recent labs.   The new medicine we talked about is Gattex.  More information can be found at [email or web address]   Thank you for choosing me and [facility].  Iva Boop, M.D., [facility]

## 2011-12-02 ENCOUNTER — Telehealth: Payer: Self-pay | Admitting: Internal Medicine

## 2011-12-02 NOTE — Assessment & Plan Note (Signed)
New.  No evidence of infxn.  Start OTC sudafed to improve sxs.  Reviewed supportive care and red flags that should prompt return.  Pt expressed understanding and is in agreement w/ plan.

## 2011-12-02 NOTE — Telephone Encounter (Signed)
Miranda at [facility] said patient had labs done yesterday and it shows she is a little dehydrated.  Would like an order for fluids.  Home Health provides TPN for the patient.  Call Miranda at [phone] ext. [phone]

## 2011-12-03 ENCOUNTER — Telehealth: Payer: Self-pay | Admitting: Internal Medicine

## 2011-12-03 NOTE — Telephone Encounter (Signed)
[name] with [facility] looking for orders for fluids based on her labs. Please, advise

## 2011-12-03 NOTE — Progress Notes (Signed)
  Subjective:    Patient ID: [name] [name], female    DOB: [birth year], 61 y.o.   MRN: [id number]  HPI The patient presents for followup. She has not been seen in months. I have followed her through medications from her home care agency. She continues on TPN. Unfortunately there is no multivitamin available at this time. She complains of severe fatigue and loss of appetite. She has a new anemia problem worse than previous but has not been bleeding, Hemoccult were negative also. She has received 2 transfusions. Her hematologist continues to follow her for these problems. Etiology of anemia not entirely clear yet. She feels cold frequently. She has gas and bloating, she is continued on her cyclic antibiotics. She is able to get a B complex and folic acid. Her weight has been overall fairly stable it seems.She is drinking ensure each day.  Medications, allergies, past medical history, past surgical history, family history and social history are reviewed and updated in the EMR.  Review of Systems As per history of present illness    Objective:   Physical Exam General:  NAD Eyes:   Anicteric Mouth:  She is missing teeth and those remainder in poor repair Lungs:  clear Heart:  S1S2 no rubs, murmurs or gallops Abdomen:  soft and mildly tender in right lower quadrant where there is a mass effect known to be a chronic scar as before detect no hepatosplenomegaly, BS+ Ext:   no edema        Assessment & Plan:   1. Short bowel syndrome   2. Anemia, unspecified   3. Anorexia   1. Review home Health Labs 2. Needs [facility] Appointment Dr. Gwenith Spitz re: ? Candidate for Gattex therapy 3. Will discuss anemia with Dr. Park Breed but ? If liver disease is progressing as it seems like she could have portal hypertension but doppler US was ok with norrmal direction of flow in portal system 4. Further plans pending lab review also 5. ? Needs repeat chest CT to follow-up nodules (had improved on antibiotics but not  resolved), maybe pulmonary eval. / MAI or other chronic atypical infection that could cause constitutional sxs  Cc: [name], MD

## 2011-12-03 NOTE — Telephone Encounter (Signed)
Per Dr. Leone Payor, has not seen any labs on patient. Called and [facility] to fax over labs. Spoke with Dr. Leone Payor and he will call [facility] to discuss IVF options.

## 2011-12-08 ENCOUNTER — Encounter (HOSPITAL_COMMUNITY): Payer: Self-pay | Admitting: Emergency Medicine

## 2011-12-08 ENCOUNTER — Inpatient Hospital Stay (HOSPITAL_COMMUNITY)
Admission: EM | Admit: 2011-12-08 | Discharge: 2011-12-17 | DRG: 574 | Disposition: A | Discharge status: Home Health | Payer: BC Managed Care – PPO | Attending: Family Medicine | Admitting: Family Medicine

## 2011-12-08 DIAGNOSIS — Z9049 Acquired absence of other specified parts of digestive tract: Secondary | ICD-10-CM

## 2011-12-08 DIAGNOSIS — K219 Gastro-esophageal reflux disease without esophagitis: Secondary | ICD-10-CM

## 2011-12-08 DIAGNOSIS — B379 Candidiasis, unspecified: Secondary | ICD-10-CM

## 2011-12-08 DIAGNOSIS — R319 Hematuria, unspecified: Secondary | ICD-10-CM | POA: Diagnosis present

## 2011-12-08 DIAGNOSIS — D61818 Other pancytopenia: Principal | ICD-10-CM | POA: Diagnosis present

## 2011-12-08 DIAGNOSIS — Z7901 Long term (current) use of anticoagulants: Secondary | ICD-10-CM

## 2011-12-08 DIAGNOSIS — K089 Disorder of teeth and supporting structures, unspecified: Secondary | ICD-10-CM

## 2011-12-08 DIAGNOSIS — B3789 Other sites of candidiasis: Secondary | ICD-10-CM | POA: Diagnosis present

## 2011-12-08 DIAGNOSIS — R7881 Bacteremia: Secondary | ICD-10-CM

## 2011-12-08 DIAGNOSIS — R911 Solitary pulmonary nodule: Secondary | ICD-10-CM | POA: Diagnosis present

## 2011-12-08 DIAGNOSIS — M810 Age-related osteoporosis without current pathological fracture: Secondary | ICD-10-CM

## 2011-12-08 DIAGNOSIS — D638 Anemia in other chronic diseases classified elsewhere: Secondary | ICD-10-CM | POA: Diagnosis present

## 2011-12-08 DIAGNOSIS — E162 Hypoglycemia, unspecified: Secondary | ICD-10-CM | POA: Diagnosis present

## 2011-12-08 DIAGNOSIS — E876 Hypokalemia: Secondary | ICD-10-CM | POA: Diagnosis not present

## 2011-12-08 DIAGNOSIS — Z6825 Body mass index (BMI) 25.0-25.9, adult: Secondary | ICD-10-CM

## 2011-12-08 DIAGNOSIS — R7402 Elevation of levels of lactic acid dehydrogenase (LDH): Secondary | ICD-10-CM

## 2011-12-08 DIAGNOSIS — R630 Anorexia: Secondary | ICD-10-CM | POA: Diagnosis present

## 2011-12-08 DIAGNOSIS — Z8719 Personal history of other diseases of the digestive system: Secondary | ICD-10-CM

## 2011-12-08 DIAGNOSIS — N179 Acute kidney failure, unspecified: Secondary | ICD-10-CM | POA: Diagnosis present

## 2011-12-08 DIAGNOSIS — R748 Abnormal levels of other serum enzymes: Secondary | ICD-10-CM

## 2011-12-08 DIAGNOSIS — J019 Acute sinusitis, unspecified: Secondary | ICD-10-CM

## 2011-12-08 DIAGNOSIS — R509 Fever, unspecified: Secondary | ICD-10-CM

## 2011-12-08 DIAGNOSIS — R161 Splenomegaly, not elsewhere classified: Secondary | ICD-10-CM | POA: Diagnosis present

## 2011-12-08 DIAGNOSIS — E559 Vitamin D deficiency, unspecified: Secondary | ICD-10-CM

## 2011-12-08 DIAGNOSIS — N2589 Other disorders resulting from impaired renal tubular function: Secondary | ICD-10-CM | POA: Diagnosis present

## 2011-12-08 DIAGNOSIS — Z79899 Other long term (current) drug therapy: Secondary | ICD-10-CM

## 2011-12-08 DIAGNOSIS — R9389 Abnormal findings on diagnostic imaging of other specified body structures: Secondary | ICD-10-CM

## 2011-12-08 DIAGNOSIS — R197 Diarrhea, unspecified: Secondary | ICD-10-CM | POA: Diagnosis present

## 2011-12-08 DIAGNOSIS — Z86718 Personal history of other venous thrombosis and embolism: Secondary | ICD-10-CM

## 2011-12-08 DIAGNOSIS — E639 Nutritional deficiency, unspecified: Secondary | ICD-10-CM

## 2011-12-08 DIAGNOSIS — D6859 Other primary thrombophilia: Secondary | ICD-10-CM

## 2011-12-08 DIAGNOSIS — K90829 Short bowel syndrome, unspecified: Secondary | ICD-10-CM | POA: Diagnosis present

## 2011-12-08 DIAGNOSIS — E860 Dehydration: Secondary | ICD-10-CM | POA: Diagnosis present

## 2011-12-08 DIAGNOSIS — I6529 Occlusion and stenosis of unspecified carotid artery: Secondary | ICD-10-CM | POA: Diagnosis present

## 2011-12-08 DIAGNOSIS — K6389 Other specified diseases of intestine: Secondary | ICD-10-CM

## 2011-12-08 DIAGNOSIS — E871 Hypo-osmolality and hyponatremia: Secondary | ICD-10-CM | POA: Diagnosis present

## 2011-12-08 DIAGNOSIS — H109 Unspecified conjunctivitis: Secondary | ICD-10-CM

## 2011-12-08 DIAGNOSIS — N390 Urinary tract infection, site not specified: Secondary | ICD-10-CM

## 2011-12-08 DIAGNOSIS — K912 Postsurgical malabsorption, not elsewhere classified: Secondary | ICD-10-CM | POA: Diagnosis present

## 2011-12-08 DIAGNOSIS — H6980 Other specified disorders of Eustachian tube, unspecified ear: Secondary | ICD-10-CM

## 2011-12-08 DIAGNOSIS — F17200 Nicotine dependence, unspecified, uncomplicated: Secondary | ICD-10-CM | POA: Diagnosis present

## 2011-12-08 LAB — COMPREHENSIVE METABOLIC PANEL
ALT: 24 U/L (ref 0–35)
Albumin: 2.6 g/dL — ABNORMAL LOW (ref 3.5–5.2)
Alkaline Phosphatase: 140 U/L — ABNORMAL HIGH (ref 39–117)
BUN: 43 mg/dL — ABNORMAL HIGH (ref 6–23)
Calcium: 8.1 mg/dL — ABNORMAL LOW (ref 8.4–10.5)
GFR calc Af Amer: 26 mL/min — ABNORMAL LOW (ref >90)
Glucose, Bld: 77 mg/dL (ref 70–99)
Potassium: 4.3 mEq/L (ref 3.5–5.1)
Sodium: 122 mEq/L — ABNORMAL LOW (ref 135–145)
Total Protein: 8.1 g/dL (ref 6.0–8.3)

## 2011-12-08 LAB — PROTIME-INR: INR: 4.33 — ABNORMAL HIGH (ref 0.00–1.49)

## 2011-12-08 LAB — CBC WITH DIFFERENTIAL/PLATELET
Basophils Absolute: 0 10*3/uL (ref 0.0–0.1)
Basophils Relative: 0 % (ref 0–1)
Lymphocytes Relative: 24 % (ref 12–46)
MCHC: 33 g/dL (ref 30.0–36.0)
Neutro Abs: 2.4 10*3/uL (ref 1.7–7.7)
Neutrophils Relative %: 65 % (ref 43–77)
Platelets: 70 10*3/uL — ABNORMAL LOW (ref 150–400)
RDW: 16.3 % — ABNORMAL HIGH (ref 11.5–15.5)
WBC: 3.8 10*3/uL — ABNORMAL LOW (ref 4.0–10.5)

## 2011-12-08 LAB — URINE MICROSCOPIC-ADD ON

## 2011-12-08 LAB — URINALYSIS, ROUTINE W REFLEX MICROSCOPIC
Glucose, UA: NEGATIVE mg/dL
Ketones, ur: NEGATIVE mg/dL
Nitrite: NEGATIVE
Specific Gravity, Urine: 1.013 (ref 1.005–1.030)
pH: 7 (ref 5.0–8.0)

## 2011-12-08 LAB — MAGNESIUM: Magnesium: 1.7 mg/dL (ref 1.5–2.5)

## 2011-12-08 MED ORDER — SODIUM CHLORIDE 0.9 % IJ SOLN
10.0000 mL | INTRAMUSCULAR | Status: DC | PRN
  Administered 2011-12-08: 10 mL

## 2011-12-08 MED ORDER — ADULT MULTIVITAMIN W/MINERALS CH
1.0000 | ORAL_TABLET | Freq: Every day | ORAL | Status: DC
  Administered 2011-12-09 – 2011-12-17 (×8): 1 via ORAL
  Filled 2011-12-08 (×9): qty 1

## 2011-12-08 MED ORDER — WARFARIN SODIUM 10 MG PO TABS: 10.0000 mg | ORAL_TABLET | Freq: Every day | ORAL | Status: DC

## 2011-12-08 MED ORDER — MORPHINE SULFATE 2 MG/ML IJ SOLN: 1.0000 mg | INTRAMUSCULAR | Status: DC | PRN

## 2011-12-08 MED ORDER — SODIUM CHLORIDE 0.9 % IV SOLN
1000.0000 mL | INTRAVENOUS | Status: DC
  Administered 2011-12-08: 1000 mL via INTRAVENOUS

## 2011-12-08 MED ORDER — ONDANSETRON HCL 4 MG/2ML IJ SOLN
4.0000 mg | Freq: Four times a day (QID) | INTRAMUSCULAR | Status: DC | PRN
  Administered 2011-12-09 – 2011-12-13 (×5): 4 mg via INTRAVENOUS
  Filled 2011-12-08 (×5): qty 2

## 2011-12-08 MED ORDER — THIAMINE HCL 100 MG/ML IJ SOLN
Freq: Once | INTRAVENOUS | Status: AC
  Administered 2011-12-08: via INTRAVENOUS
  Filled 2011-12-08: qty 1000

## 2011-12-08 MED ORDER — SODIUM CHLORIDE 0.9 % IV SOLN: INTRAVENOUS | Status: DC

## 2011-12-08 MED ORDER — ONDANSETRON HCL 4 MG PO TABS
4.0000 mg | ORAL_TABLET | Freq: Four times a day (QID) | ORAL | Status: DC | PRN
  Administered 2011-12-16: 4 mg via ORAL
  Filled 2011-12-08: qty 1

## 2011-12-08 MED ORDER — PANTOPRAZOLE SODIUM 40 MG PO TBEC
40.0000 mg | DELAYED_RELEASE_TABLET | Freq: Every day | ORAL | Status: DC
  Administered 2011-12-09 – 2011-12-17 (×8): 40 mg via ORAL
  Filled 2011-12-08 (×9): qty 1

## 2011-12-08 MED ORDER — ONDANSETRON HCL 4 MG/2ML IJ SOLN
4.0000 mg | Freq: Once | INTRAMUSCULAR | Status: AC
  Administered 2011-12-08: 4 mg via INTRAVENOUS
  Filled 2011-12-08: qty 2

## 2011-12-08 NOTE — H&P (Signed)
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 Leone Payor who recieves tpn and ivf at home comes in with some mild lower abd crampiness and subjective fever thought she had another uti.  She is on abx frequently for bacteria overgrowth in bowel.  She is found to be in worsening renal failure.  She has had limited po intake going on for several months now.  Always nauseas but  No vomiting.  No change in bm.  No bleeding issues.  She has had several heme cards done as outpt which have all been negative for w/u of anemia which requires frequent blood transfusions.  Also sees heme for her anemia and unclear the etiology of that.  Has not had bone marrow bx done.  She is on coumadin.  Reports again no bleeding issues.  She says that a component of her tpn is in short supply and she has not been able to get it and believes this may the cause of her deteroriation over the last several months.  She has had abnormalities in her phos levels recently also.  We are admitting her mainly for her dehydration/renal failure.  Last cr was normal.  Review of Systems:  O/w neg  Past Medical History: Past Medical History  Diagnosis Date  . Short bowel syndrome   . Abnormal LFTs   . Vitamin d deficiency   . Personal history of colonic polyps   . Thrombophilia   . Diabetes mellitus     resolved after weight loss  . Obesity     prior to weight loss  . Allergic rhinosinusitis   . At risk for dental problems   . Fracture of left clavicle   . Osteoporosis   . Anemia of chronic disease   . Renal insufficiency   . Atypical nevus   . Pathologic fracture of neck of femur   . Pancytopenia 10/07/2011  . Small bowel ischemia   . Superior mesenteric artery stenosis   . Bacterial overgrowth syndrome    Past Surgical History    Procedure Date  . Small intestine surgery 2005    multiple with right colon resection for ischemia/infarct  . Cholecystectomy   . Orif proximal femoral fracture w/ itst nail system 03/2007    left, Dr. Simonne Come  . Esophagogastroduodenoscopy 01/22/2009    erosive esophagitis  . Colonoscopy 12/05/2005    internal hemorrhoids (for polyp surveillance)    Medications: Prior to Admission medications   Medication Sig Start Date End Date Taking? Authorizing Provider  calcium carbonate (TUMS EX) 750 MG chewable tablet Chew 2 tablets by mouth 3 (three) times daily. Calcium   Yes Historical Provider, MD  ciprofloxacin (CIPRO) 500 MG tablet TAKE 1 TABLET BY MOUTH TWICE DAILY FOR 1 WEEK AT A TIME INTERMITTENTLY AS DIRECTED FOR BACTERIAL OVERGROWTH 11/10/11  Yes Iva Boop, MD  diphenhydrAMINE (BENADRYL) 25 mg capsule Take 25 mg by mouth every 6 (six) hours as needed. For itching/sleep.   Yes Historical Provider, MD  Eflornithine HCl (VANIQA) 13.9 % cream Apply 1 application topically 2 (two) times daily with a meal. Applied to facial hair.   Yes Historical Provider, MD  Multiple Vitamin (MULTIVITAMIN WITH MINERALS) TABS Take 1 tablet by mouth daily.   Yes Historical Provider, MD  omeprazole (PRILOSEC)  40 MG capsule Take 40 mg by mouth daily. 11/20/10  Yes Iva Boop, MD  Parenteral Electrolytes (TPN ELECTROLYTES IV) Inject 1 each into the vein at bedtime.    Yes Historical Provider, MD  promethazine (PHENERGAN) 25 MG tablet Take 25 mg by mouth every 6 (six) hours as needed. For nausea.   Yes Historical Provider, MD  ursodiol (ACTIGALL) 300 MG capsule TAKE 1 CAPSULE BY MOUTH TWICE DAILY 07/11/11  Yes Iva Boop, MD  Vitamin D, Ergocalciferol, (DRISDOL) 50000 UNITS CAPS ALTERNATE TAKING 1 CAPSULE BY MOUTH EVERY OTHER DAY AND 2 CAPSULES BY MOUTH EVERY OTHER DAY 10/15/11  Yes Iva Boop, MD  warfarin (COUMADIN) 10 MG tablet Take 1 tablet (10 mg total) by mouth daily. 09/18/11 09/17/12 Yes Victorino December, MD    Allergies:   Allergies  Allergen Reactions  . Iohexol Itching    IVP Dye ok if taken with benadryl  . Penicillins Itching    Social History:  reports that she has been smoking Cigarettes.  She does not have any smokeless tobacco history on file. She reports that she does not drink alcohol or use illicit drugs.  Family History: Family History  Problem Relation Age of Onset  . Diabetes Mother   . Hypertension Mother   . Colon cancer Neg Hx     Physical Exam: Filed Vitals:   12/08/11 1730 12/08/11 2010  BP: 123/68 108/56  Pulse: 86 73  Temp: 99.1 F (37.3 C) 99.3 F (37.4 C)  TempSrc: Oral Oral  Resp: 25 20  SpO2: 97% 93%   General appearance: alert, cooperative and no distress Lungs: clear to auscultation bilaterally Heart: regular rate and rhythm, S1, S2 normal, no murmur, click, rub or gallop Abdomen: soft, non-tender; bowel sounds normal; no masses,  no organomegaly Extremities: extremities normal, atraumatic, no cyanosis or edema Pulses: 2+ and symmetric Skin: Skin color, texture, turgor normal. No rashes or lesions Neurologic: Grossly normal    Labs on Admission:   [facility] 12/08/11 1757  NA 122*  K 4.3  CL 94*  CO2 17*  GLUCOSE 77  BUN 43*  CREATININE 2.27*  CALCIUM 8.1*  MG --  PHOS --    Basename 12/08/11 1757  AST 26  ALT 24  ALKPHOS 140*  BILITOT 0.6  PROT 8.1  ALBUMIN 2.6*    Basename 12/08/11 1757  WBC 3.8*  NEUTROABS 2.4  HGB 7.2*  HCT 21.8*  MCV 80.7  PLT 70*   Radiological Exams on Admission: No results found.  Assessment/Plan Present on Admission:  61 yo female with short gut syndrome, renal failure likely prerenal and chronic anemia .Anorexia .Dehydration .Acute renal failure .Acquired short bowel syndrome .Nutritional deficiency .ANEMIA OF CHRONIC DISEASE .Hyponatremia  Will admit for ivf.  Ck phos and mag levels.  Hold tpn for now.  Transfuse 2 units of blood.  No overt bleeding.  Called [facility]  gi on call dr Christella Hartigan who will see the patient in the am.  Iv morphine and zofran prn.  Pt has benign abd exam.  No source of infection.    Bennye Nix [phone] 12/08/2011, 8:43 PM

## 2011-12-08 NOTE — ED Notes (Signed)
Pt presenting to ed with c/o lower abdominal pain with nausea no vomiting. Pt states pain radiates into her back. Pt states decreased appetite and decreased urine. Pt states burning with urination and foul odor with urination. Pt states she has short bowel syndrome and she has diarrhea all the time. Pt's family states pt looks pale in color

## 2011-12-08 NOTE — ED Provider Notes (Addendum)
History     CSN: [id number]  Arrival date & time 12/08/11  1658   First MD Initiated Contact with Patient 12/08/11 1937      Chief Complaint  Patient presents with  . Abdominal Pain  . Back Pain    (Consider location/radiation/quality/duration/timing/severity/associated sxs/prior treatment) Patient is a 61 y.o. female presenting with abdominal pain and back pain. The history is provided by the patient.  Abdominal Pain The primary symptoms of the illness include abdominal pain.  Additional symptoms associated with the illness include back pain.  Back Pain  Associated symptoms include abdominal pain.  here with abdominal pain and fever without black/bloody stools. Nausea and vomiting present--h/o short bowel syndrome and has been receiving iv saline for her chronic diarrhea--increased weakness without syncope--nothing makes her sx worse  Past Medical History  Diagnosis Date  . Short bowel syndrome   . Abnormal LFTs   . Vitamin d deficiency   . Personal history of colonic polyps   . Thrombophilia   . Diabetes mellitus     resolved after weight loss  . Obesity     prior to weight loss  . Allergic rhinosinusitis   . At risk for dental problems   . Fracture of left clavicle   . Osteoporosis   . Anemia of chronic disease   . Renal insufficiency   . Atypical nevus   . Pathologic fracture of neck of femur   . Pancytopenia 10/07/2011  . Small bowel ischemia   . Superior mesenteric artery stenosis   . Bacterial overgrowth syndrome     Past Surgical History  Procedure Date  . Small intestine surgery 2005    multiple with right colon resection for ischemia/infarct  . Cholecystectomy   . Orif proximal femoral fracture w/ itst nail system 03/2007    left, Dr. Simonne Come  . Esophagogastroduodenoscopy 01/22/2009    erosive esophagitis  . Colonoscopy 12/05/2005    internal hemorrhoids (for polyp surveillance)    Family History  Problem Relation Age of Onset  . Diabetes  Mother   . Hypertension Mother   . Colon cancer Neg Hx     History  Substance Use Topics  . Smoking status: Current Some Day Smoker    Types: Cigarettes    Last Attempt to Quit: 05/12/2004  . Smokeless tobacco: Not on file  . Alcohol Use: No    OB History    Grav Para Term Preterm Abortions TAB SAB Ect Mult Living                  Review of Systems  Gastrointestinal: Positive for abdominal pain.  Musculoskeletal: Positive for back pain.    Allergies  Iohexol and Penicillins  Home Medications   Current Outpatient Rx  Name Route Sig Dispense Refill  . CALCIUM CARBONATE ANTACID 750 MG PO CHEW Oral Chew 2 tablets by mouth 3 (three) times daily. Calcium    . CIPROFLOXACIN HCL 500 MG PO TABS  TAKE 1 TABLET BY MOUTH TWICE DAILY FOR 1 WEEK AT A TIME INTERMITTENTLY AS DIRECTED FOR BACTERIAL OVERGROWTH 28 tablet 4  . DIPHENHYDRAMINE HCL 25 MG PO CAPS Oral Take 25 mg by mouth every 6 (six) hours as needed. For itching/sleep.    Marland Kitchen EFLORNITHINE HCL 13.9 % EX CREA Topical Apply 1 application topically 2 (two) times daily with a meal. Applied to facial hair.    . ADULT MULTIVITAMIN W/MINERALS CH Oral Take 1 tablet by mouth daily.    Marland Kitchen OMEPRAZOLE 40 MG  PO CPDR Oral Take 40 mg by mouth daily.    . TPN ELECTROLYTES IV Intravenous Inject 1 each into the vein at bedtime.     Marland Kitchen PROMETHAZINE HCL 25 MG PO TABS Oral Take 25 mg by mouth every 6 (six) hours as needed. For nausea.    Marland Kitchen URSODIOL 300 MG PO CAPS  TAKE 1 CAPSULE BY MOUTH TWICE DAILY 60 capsule 5  . VITAMIN D (ERGOCALCIFEROL) 50000 UNITS PO CAPS  ALTERNATE TAKING 1 CAPSULE BY MOUTH EVERY OTHER DAY AND 2 CAPSULES BY MOUTH EVERY OTHER DAY 45 capsule 1  . WARFARIN SODIUM 10 MG PO TABS Oral Take 1 tablet (10 mg total) by mouth daily. 60 tablet 12    BP 123/68  Pulse 86  Temp 99.1 F (37.3 C) (Oral)  Resp 25  SpO2 97%  Physical Exam  Nursing note and vitals reviewed. Constitutional: She is oriented to person, place, and time. She  appears well-developed and well-nourished.  Non-toxic appearance. No distress.  HENT:  Head: Normocephalic and atraumatic.  Eyes: Conjunctivae, EOM and lids are normal. Pupils are equal, round, and reactive to light.  Neck: Normal range of motion. Neck supple. No tracheal deviation present. No mass present.  Cardiovascular: Normal rate, regular rhythm and normal heart sounds.  Exam reveals no gallop.   No murmur heard. Pulmonary/Chest: Effort normal and breath sounds normal. No stridor. No respiratory distress. She has no decreased breath sounds. She has no wheezes. She has no rhonchi. She has no rales.  Abdominal: Soft. Normal appearance and bowel sounds are normal. She exhibits no distension. There is no tenderness. There is no rigidity, no rebound, no guarding and no CVA tenderness.  Musculoskeletal: Normal range of motion. She exhibits no edema and no tenderness.  Neurological: She is alert and oriented to person, place, and time. She has normal strength. No cranial nerve deficit or sensory deficit. GCS eye subscore is 4. GCS verbal subscore is 5. GCS motor subscore is 6.  Skin: Skin is warm and dry. No abrasion and no rash noted.  Psychiatric: She has a normal mood and affect. Her speech is normal and behavior is normal.    ED Course  Procedures (including critical care time)  Labs Reviewed  CBC WITH DIFFERENTIAL - Abnormal; Notable for the following:    WBC 3.8 (*)     RBC 2.70 (*)     Hemoglobin 7.2 (*)     HCT 21.8 (*)     RDW 16.3 (*)     Platelets 70 (*)     All other components within normal limits  COMPREHENSIVE METABOLIC PANEL - Abnormal; Notable for the following:    Sodium 122 (*)     Chloride 94 (*)     CO2 17 (*)     BUN 43 (*)     Creatinine, Ser 2.27 (*)     Calcium 8.1 (*)     Albumin 2.6 (*)     Alkaline Phosphatase 140 (*)     GFR calc non Af Amer 22 (*)     GFR calc Af Amer 26 (*)     All other components within normal limits  URINALYSIS, ROUTINE W REFLEX  MICROSCOPIC - Abnormal; Notable for the following:    Color, Urine AMBER (*)  BIOCHEMICALS MAY BE AFFECTED BY COLOR   APPearance CLOUDY (*)     Hgb urine dipstick LARGE (*)     Protein, ur 100 (*)     Leukocytes, UA TRACE (*)  All other components within normal limits  PROTIME-INR - Abnormal; Notable for the following:    Prothrombin Time 42.1 (*)     INR 4.33 (*)     All other components within normal limits  URINE MICROSCOPIC-ADD ON - Abnormal; Notable for the following:    Squamous Epithelial / LPF FEW (*)     Bacteria, UA FEW (*)     All other components within normal limits  SPECIMEN HOLD   No results found.   1. Hyponatremia       MDM  Iv fluids ordered, anemia [place] chronic, spoke with triad, will admit        Toy Baker, MD 12/08/11 2016  Toy Baker, MD 12/25/11 1709  Toy Baker, MD 12/25/11 1710

## 2011-12-08 NOTE — ED Notes (Addendum)
IV team called r/t PICC vs. MIDLINE-placement-confirmation of PICC placement done on 08-13-11 at [facility]

## 2011-12-08 NOTE — ED Notes (Signed)
Pulled back 5cc of blood/PICC line flushed with 5cc of NS without difficulty-per patient/husband PICC line used daily for fluid/TPN infusions

## 2011-12-09 ENCOUNTER — Inpatient Hospital Stay (HOSPITAL_COMMUNITY): Payer: BC Managed Care – PPO

## 2011-12-09 ENCOUNTER — Ambulatory Visit: Payer: BC Managed Care – PPO | Admitting: Family Medicine

## 2011-12-09 DIAGNOSIS — N390 Urinary tract infection, site not specified: Secondary | ICD-10-CM

## 2011-12-09 DIAGNOSIS — K912 Postsurgical malabsorption, not elsewhere classified: Secondary | ICD-10-CM

## 2011-12-09 DIAGNOSIS — E871 Hypo-osmolality and hyponatremia: Secondary | ICD-10-CM

## 2011-12-09 DIAGNOSIS — N179 Acute kidney failure, unspecified: Secondary | ICD-10-CM

## 2011-12-09 LAB — COMPREHENSIVE METABOLIC PANEL
ALT: 23 U/L (ref 0–35)
Alkaline Phosphatase: 130 U/L — ABNORMAL HIGH (ref 39–117)
CO2: 18 mEq/L — ABNORMAL LOW (ref 19–32)
Calcium: 7.7 mg/dL — ABNORMAL LOW (ref 8.4–10.5)
GFR calc Af Amer: 24 mL/min — ABNORMAL LOW (ref >90)
GFR calc non Af Amer: 21 mL/min — ABNORMAL LOW (ref >90)
Glucose, Bld: 64 mg/dL — ABNORMAL LOW (ref 70–99)
Potassium: 3.9 mEq/L (ref 3.5–5.1)
Sodium: 128 mEq/L — ABNORMAL LOW (ref 135–145)

## 2011-12-09 LAB — CBC
HCT: 24.2 % — ABNORMAL LOW (ref 36.0–46.0)
Hemoglobin: 8 g/dL — ABNORMAL LOW (ref 12.0–15.0)
MCH: 26.9 pg (ref 26.0–34.0)
RBC: 2.97 MIL/uL — ABNORMAL LOW (ref 3.87–5.11)

## 2011-12-09 LAB — PROTIME-INR: Prothrombin Time: 21.4 seconds — ABNORMAL HIGH (ref 11.6–15.2)

## 2011-12-09 MED ORDER — MAGNESIUM SULFATE 40 MG/ML IJ SOLN
2.0000 g | Freq: Once | INTRAMUSCULAR | Status: AC
  Administered 2011-12-09: 2 g via INTRAVENOUS
  Filled 2011-12-09: qty 50

## 2011-12-09 MED ORDER — ACETAMINOPHEN 325 MG PO TABS
650.0000 mg | ORAL_TABLET | ORAL | Status: DC | PRN
  Administered 2011-12-09 – 2011-12-17 (×18): 650 mg via ORAL
  Filled 2011-12-09 (×18): qty 2

## 2011-12-09 MED ORDER — WARFARIN - PHARMACIST DOSING INPATIENT: Freq: Every day | Status: DC

## 2011-12-09 MED ORDER — WARFARIN SODIUM 10 MG PO TABS
10.0000 mg | ORAL_TABLET | Freq: Once | ORAL | Status: AC
  Administered 2011-12-09: 10 mg via ORAL
  Filled 2011-12-09: qty 1

## 2011-12-09 MED ORDER — SODIUM CHLORIDE 0.9 % IV SOLN
INTRAVENOUS | Status: DC
  Administered 2011-12-09 – 2011-12-11 (×4): via INTRAVENOUS

## 2011-12-09 MED ORDER — INSULIN ASPART 100 UNIT/ML ~~LOC~~ SOLN
0.0000 [IU] | SUBCUTANEOUS | Status: DC
  Administered 2011-12-10: 1 [IU] via SUBCUTANEOUS

## 2011-12-09 MED ORDER — DIPHENHYDRAMINE HCL 25 MG PO CAPS
25.0000 mg | ORAL_CAPSULE | Freq: Four times a day (QID) | ORAL | Status: DC | PRN
  Administered 2011-12-09 – 2011-12-17 (×8): 25 mg via ORAL
  Filled 2011-12-09 (×8): qty 1

## 2011-12-09 MED ORDER — ENOXAPARIN SODIUM 80 MG/0.8ML ~~LOC~~ SOLN
70.0000 mg | SUBCUTANEOUS | Status: DC
  Administered 2011-12-09 – 2011-12-10 (×2): 70 mg via SUBCUTANEOUS
  Filled 2011-12-09 (×3): qty 0.8

## 2011-12-09 MED ORDER — CLINIMIX E/DEXTROSE (4.25/25) 4.25 % IV SOLN
INTRAVENOUS | Status: AC
  Administered 2011-12-09: 18:00:00 via INTRAVENOUS
  Filled 2011-12-09: qty 1000

## 2011-12-09 MED ORDER — PHENAZOPYRIDINE HCL 100 MG PO TABS
100.0000 mg | ORAL_TABLET | Freq: Once | ORAL | Status: AC
  Administered 2011-12-09: 100 mg via ORAL
  Filled 2011-12-09: qty 1

## 2011-12-09 NOTE — Progress Notes (Signed)
PARENTERAL NUTRITION CONSULT NOTE - INITIAL  Pharmacy Consult for TNA Indication: Short Gut Syndrome  Allergies  Allergen Reactions  . Iohexol Itching    IVP Dye ok if taken with benadryl  . Penicillins Itching    Patient Measurements: Height: 5\' 8"  (172.7 cm) Weight: 160 lb (72.576 kg) IBW/kg (Calculated) : 63.9  Adjusted Body Weight: 67.4kg Usual Weight: ~155 lbs (70.4kg) - per pt report  Vital Signs: Temp: 98 F (36.7 C) (07/30 0550) Temp src: Oral (07/30 0550) BP: 106/62 mmHg (07/30 0550) Pulse Rate: 68  (07/30 0550) Intake/Output from previous day: 07/29 0701 - 07/30 0700 In: 250 [I.V.:250] Out: -  Intake/Output from this shift:    Labs:  [facility] 12/08/11 1757  WBC 3.8*  HGB 7.2*  HCT 21.8*  PLT 70*  APTT --  INR 4.33*     Basename 12/08/11 1757  NA 122*  K 4.3  CL 94*  CO2 17*  GLUCOSE 77  BUN 43*  CREATININE 2.27*  LABCREA --  CREAT24HRUR --  CALCIUM 8.1*  MG 1.7  PHOS 2.7  PROT 8.1  ALBUMIN 2.6*  AST 26  ALT 24  ALKPHOS 140*  BILITOT 0.6  BILIDIR --  IBILI --  PREALBUMIN --  TRIG --  CHOLHDL --  CHOL --   Estimated Creatinine Clearance: 26.3 ml/min (by C-G formula based on Cr of 2.27).   No results found for this basename: GLUCAP:3 in the last 72 hours  Medical History: Past Medical History  Diagnosis Date  . Short bowel syndrome   . Abnormal LFTs   . Vitamin d deficiency   . Personal history of colonic polyps   . Thrombophilia   . Diabetes mellitus     resolved after weight loss  . Obesity     prior to weight loss  . Allergic rhinosinusitis   . At risk for dental problems   . Fracture of left clavicle   . Osteoporosis   . Anemia of chronic disease   . Renal insufficiency   . Atypical nevus   . Pathologic fracture of neck of femur   . Pancytopenia 10/07/2011  . Small bowel ischemia   . Superior mesenteric artery stenosis   . Bacterial overgrowth syndrome     Medications:  Scheduled:    . sodium chloride    Intravenous STAT  . magnesium sulfate 1 - 4 g bolus IVPB  2 g Intravenous Once  . multivitamin with minerals  1 tablet Oral Daily  . ondansetron (ZOFRAN) IV  4 mg Intravenous Once  . pantoprazole  40 mg Oral Q1200  . phenazopyridine  100 mg Oral Once  . general admission iv infusion   Intravenous Once  . Warfarin - Pharmacist Dosing Inpatient   Does not apply q1800  . DISCONTD: warfarin  10 mg Oral Daily   Infusions:    . DISCONTD: sodium chloride 1,000 mL (12/08/11 1854)    Insulin Requirements in the past 24 hours:  None ordered  Nutritional Goals:  (pending RD assessment) Kcal: 1685 - 2360 kcal/day  Protein: 80-135 g/day (althought this will be limited by ARF)  Goal TNA = Clinimx E 4.25/25 - infused over 10-12 hours plus IV lipids over 10-12hr on MWF to provide 42.5g Protein/day, 1420 kcal/day on lipid days, for an average of 1200 kcal/day.  Current Nutrition:  NPO Chronic home TNA  Assessment: 61 yo F admitted 12/08/11 with CC: low grade fever and inability to eat for several months, found to be in  worsening renal failure, ultimately admitted for dehydration/renal failure. 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 She is on chronic TNA at home (for 7 years, per pt report) infused via PICC (placed 08/13/11 at [facility] per report) Pt also has hx of abnormal LFTs, DM ("resolved after weight loss"), erosive esophagitis, anemia of chronic dz, and renal insufficiency. She is on abx frequently for bacteria overgrowth in bowel. Reportedly patient was asking about when she can resume TNA last night, so verbal order given to resume TNA today from the overnight NP. Plan will be to resume TNA at 18:00 tonight (per usual [facility] TNA administration time).  Had lengthy discussion with patient today (12/09/11) who reports that she's been on TNA for 7 years. She reports that she's allowed to eat "whatever she wants"  at home in addition to the TNA, but the food "usually passes right though her" (her daughter estimates within 30 mins). For the past couple weeks, patient reports having no appetite; however, she mentions she is hungry this morning. Spoke with a family member over the phone (granddaughter) who read her home TNA label to me, which indicates: 2100 ml total cycled over 10 hours with a 1 hour ramp up and 1 hour ramp down. TNA bag ( ) contains: 250g Dextrose, 100g Travasol 10%, 45g Intralipids 30%, MVI, TE, Zinc, Selenium, and various electrolytes. Per patient report, TNA bag also contains B-complex vitamins and folic acid, however this information was not relayed to me over the phone. Bag does not contain any insulin. This home TNA provides approx 45g Protein/day and 1675 Kcal/day. Patient is agreeable to using [place] supplied TNA and is ok if TNA is cycled over 12 hours instead of usual 10 hours.  Labs from last night: Lytes: Hyponatremia (unable to adjust Na+ content of TNA) - will need to correct this outside of TNA with other treatment options. Given 2g IV Magnesium overnight. Renal: SCr elevated, unsure what baseline SCr is at this time. CrCl ~ 54ml/min. This will limit amount of protein in TNA initially. LFTs: wnl, Alk Phos slightly elevated CBGs: none, will order tonight. GI PPx: PO Protonix IVF: Currently has a banana bag running at 153ml/hr   Plan: (at 18:00 tonight) 1) Clinimix E 4.25/25, cycled over 12 hours (6p-6a): 39ml/hr x1hr, 108ml/hr x 10hr, 10ml/hr x1 hour (off 6a-6p). 2) IV lipids, MVI, Folic acid, and TE on MWF only due to national back order. Will continue to put MVI and TE in TNA due to short gut syndrome and questionable PO absorption. 3) SSI sensitive scale revolving around cyclic admin times (due to hx of DM, even though reportedly resolved) 4) Currently does not have standing order for IVF - will adjust once IVF needs determined by MD. 5) TNA labs tomorrow, then  q Mon and Thurs. 6) F/U RD assessment of nutritional needs.  Darrol Angel, PharmD Pager: [phone] 12/09/2011,7:04 AM

## 2011-12-09 NOTE — Progress Notes (Signed)
INITIAL ADULT NUTRITION ASSESSMENT Date: 12/09/2011   Time: 11:45 AM  Reason for Assessment: Consult, new TPN   INTERVENTION: 1. TPN per pharmacy to meet estimated nutrition needs.  2. RD to monitor plan of care  ASSESSMENT: Female 61 y.o.  Dx: Acute renal failure  Hx:  Past Medical History  Diagnosis Date  . Short bowel syndrome   . Abnormal LFTs   . Vitamin d deficiency   . Personal history of colonic polyps   . Thrombophilia   . Diabetes mellitus     resolved after weight loss  . Obesity     prior to weight loss  . Allergic rhinosinusitis   . At risk for dental problems   . Fracture of left clavicle   . Osteoporosis   . Anemia of chronic disease   . Renal insufficiency   . Atypical nevus   . Pathologic fracture of neck of femur   . Pancytopenia 10/07/2011  . Small bowel ischemia   . Superior mesenteric artery stenosis   . Bacterial overgrowth syndrome    Related Meds:     . insulin aspart  0-9 Units Subcutaneous Custom  . magnesium sulfate 1 - 4 g bolus IVPB  2 g Intravenous Once  . multivitamin with minerals  1 tablet Oral Daily  . ondansetron (ZOFRAN) IV  4 mg Intravenous Once  . pantoprazole  40 mg Oral Q1200  . phenazopyridine  100 mg Oral Once  . general admission iv infusion   Intravenous Once  . Warfarin - Pharmacist Dosing Inpatient   Does not apply q1800  . DISCONTD: sodium chloride   Intravenous STAT  . DISCONTD: warfarin  10 mg Oral Daily    Ht: 5\' 8"  (172.7 cm)  Wt: 160 lb (72.576 kg)  Ideal Wt: 63.9 kg  % Ideal Wt: 114%  Usual Wt: 73 kg % Usual Wt: 99%  Body mass index is 24.33 kg/(m^2).  Food/Nutrition Related Hx: Patient on home TPN with regular diet as tolerated.   Labs:  CMP     Component Value Date/Time   NA 122* 12/08/2011 1757   NA 140 03/01/2009 1029   K 4.3 12/08/2011 1757   K 4.2 03/01/2009 1029   CL 94* 12/08/2011 1757   CL 104 03/01/2009 1029   CO2 17* 12/08/2011 1757   CO2 25 03/01/2009 1029   GLUCOSE 77  12/08/2011 1757   GLUCOSE 82 05/07/2010   BUN 43* 12/08/2011 1757   BUN 17 03/01/2009 1029   CREATININE 2.27* 12/08/2011 1757   CREATININE 0.5* 03/01/2009 1029   CALCIUM 8.1* 12/08/2011 1757   CALCIUM 8.7 03/01/2009 1029   PROT 8.1 12/08/2011 1757   PROT 7.9 03/01/2009 1029   ALBUMIN 2.6* 12/08/2011 1757   AST 26 12/08/2011 1757   AST 46* 03/01/2009 1029   ALT 24 12/08/2011 1757   ALKPHOS 140* 12/08/2011 1757   ALKPHOS 143* 03/01/2009 1029   BILITOT 0.6 12/08/2011 1757   BILITOT 0.40 03/01/2009 1029   GFRNONAA 22* 12/08/2011 1757   GFRAA 26* 12/08/2011 1757     Intake/Output Summary (Last 24 hours) at 12/09/11 1146 Last data filed at 12/09/11 1018  Gross per 24 hour  Intake  592.5 ml  Output      0 ml  Net  592.5 ml     Diet Order: Regular  Supplements/Tube Feeding: None  IVF:    sodium chloride Last Rate: 100 mL/hr at 12/09/11 1142  TPN (CLINIMIX) +/- additives   DISCONTD: sodium chloride  Last Rate: 1,000 mL (12/08/11 1854)    Estimated Nutritional Needs:   Kcal: 1600-1750 kcal Protein: 58-72 g Fluid: 2.5 L  Patient with short bowel syndrome, has been on home TPN for 7 years. She is on a regular diet as tolerated, although she has not been able to eat for several months. She was admitted with acute renal failure. Protein needs decreased due to this. TPN to start tonight. Clinimix 4.25/25 1000 ml over 10-12 hours plus lipids, MVI, folic acid cycled over 10-12 hours MWF due to national back order. This will provide 42.5 g protein, 1020 kcals on non-lipid days and 1420 kcals on lipid days, for an average of 1191 kcal.   NUTRITION DIAGNOSIS: -Altered GI function (NI-1.4).  Status: Ongoing  RELATED TO: short bowel syndrome  AS EVIDENCE BY: 1 foot of small bowel remaining  MONITORING/EVALUATION(Goals): Patient will meet 90-100% of estimated nutrition needs.  Monitor: TPN advancement and tolerance, PO intake, labs, weight  EDUCATION NEEDS: -No education needs identified  at this time    DOCUMENTATION CODES Per approved criteria  -Not Applicable    Fabio Pierce 12/09/2011, 11:45 AM

## 2011-12-09 NOTE — Care Management Note (Addendum)
    Page 1 of 2   12/18/2011     10:47:14 AM   CARE MANAGEMENT NOTE 12/17/2011  Patient:  CHINA, DEITRICK   Account Number:  [id number]  Date Initiated:  12/09/2011  Documentation initiated by:  Lanier Clam  Subjective/Objective Assessment:   ADMITTED W/ARF.     Action/Plan:   FROM HOME. ACTIVE W/[facility]-HHRN-1XMONTH-LAB DRAW.   Anticipated DC Date:  12/18/2011   Anticipated DC Plan:  HOME W HOME HEALTH SERVICES      DC Planning Services  CM consult      [facility] Choice  Resumption Of Svcs/PTA Provider   Choice offered to / List presented to:  C-1 Patient   DME arranged  NA      DME agency  NA     HH arranged  HH-1 RN  IV Antibiotics      HH agency  [facility]   Status of service:  Completed, signed off Medicare Important Message given?  NO (If response is "NO", the following Medicare IM given date fields will be blank) Date Medicare IM given:   Date Additional Medicare IM given:    Discharge Disposition:  HOME W HOME HEALTH SERVICES  Per UR Regulation:  Reviewed for med. necessity/level of care/duration of stay  If discussed at Long Length of Stay Meetings, dates discussed:   12/16/2011    Comments:  12/17/2011 Raynelle Bring BSN CCM [phone] Advised by attending MD that pt will be discharged today with [facility] for management of home IV abx, home cyclic TNA, [facility] notified of discharge. HHRN will go out this PM for 1st visit post hospital stay.  12/16/11 KATHY MAHABIR RN,BSN NCM [phone] FOR PICC TODAY.ID FOLLOWING.LONG TERM IV ABX.WILL NEED ORDER FOR HHRN-IV ABX,DOSE,FREQUENCY,DURATION/PICC LINE FLUSH PER PROTOCAL.IF RESUMING HHRN TPN WILL NEED ORDER RESUMPTION OF TPN PER HHRN PROTOCAL.[facility] FOLLOWING FOR HHRN.  12/15/11 KATHY MAHABIR RN,BSN NCM [phone] [facility] FOLLOWING FOR RESUMPTION OF HHRN-TPN.  12/10/11 KATHY MAHABIR RN,BSN NCM [phone] PATIENT INFORMED TO CONTACT INSURANCE CUSTOMER SERVICE FOR BENEFITS W/CONCERNS ABOUT DENTAL EXTRACTIONS,&  DENTAL SURGERIES NOT EXPECTED TO OCCUR DURING THIS ACUTE CARE HOSPITALIZATION.  12/09/11 KATHY MAHABIR RN,BSN NCM [phone] IF MD AGREE W/RESUMPTION OF HH ORDERS PLEASE PUT IN RESUMPTION OF HHRN-LAB DRAW/TPN/PICC LINE CARE PER PROTOCAL.[facility] SUSAN DALE(LIASON) NOTIFIED & FOLLOWING.

## 2011-12-09 NOTE — Progress Notes (Signed)
[facility] PROGRESS NOTE  Patrisha C Hammontree ZOX:[id number] DOB: 1950/06/25 DOA: 12/08/2011 PCP: [name], MD  Assessment/Plan: Principal Problem:  *Acute renal failure Active Problems:  ANEMIA OF CHRONIC DISEASE  Acquired short bowel syndrome  Nutritional deficiency  Anorexia  Dehydration  Hyponatremia   Anemia -Patient has history of chronic anemia secondary to chronic disease, she follows with Dr. Park Breed. -Patient hemoglobin usually between 8.5-9, she came in with hemoglobin of 7.2. -Status post transfusion of 2 units of packed RBCs, post-transfusion CBC pending. -She had recent 5 negative cards for FOBT on 11/03/2011. She has negative EGD/colonoscopy last year. -Weight on GI recommendation, for her anemia and splenomegaly she might need bone marrow biopsy.  Short gut syndrome -Patient is on total parenteral nutrition secondary to short gut (reported 1 foot left of her small bowel). -Patient mentioned that there is a shortage in the injectable multivitamins. -Continue TPN, multivitamins per pharmacy.  Acute renal failure -Baseline creatinine of 0.7 in January of this year, presented with creatinine of 2.27. -With elevated BUN this is likely secondary to dehydration. -Continue IV fluids and check BMP in a.m.  Hyponatremia -This is likely secondary to dehydration and acute renal failure. -Patient started on IV fluids, and I will check the BMP, if it's still there I well the urine studies.  Hematuria -Patient has painless hematuria so comes every now and then I will obtain renal ultrasound.  Code Status: Full Family Communication: Daughter at bedside updated with the plan. 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 Patient came in to the hospital with acute anemia, patient is on  Coumadin.  Consultants:  [facility]  Procedures:  None  Antibiotics:  None  HPI/Subjective: Feels much better today, she is hungry she wants to eat as she is still n.p.o.  Objective: Filed Vitals:   12/09/11 0650 12/09/11 0748 12/09/11 0818 12/09/11 0918  BP: 118/77 132/76 111/63 139/74  Pulse: 67 71 65 69  Temp: 97.8 F (36.6 C) 97.9 F (36.6 C) 97.8 F (36.6 C) 98.1 F (36.7 C)  TempSrc: Oral Oral Oral Oral  Resp: 18 20 16 18   Height:      Weight:      SpO2:    98%    Intake/Output Summary (Last 24 hours) at 12/09/11 1027 Last data filed at 12/09/11 0818  Gross per 24 hour  Intake  387.5 ml  Output      0 ml  Net  387.5 ml    Exam:  General: Alert and awake, oriented x3, not in any acute distress. HEENT: anicteric sclera, pupils reactive to light and accommodation, EOMI CVS: S1-S2 clear, no murmur rubs or gallops Chest: clear to auscultation bilaterally, no wheezing, rales or rhonchi Abdomen: soft nontender, nondistended, normal bowel sounds, no organomegaly Extremities: no cyanosis, clubbing or edema noted bilaterally Neuro: Cranial nerves II-XII intact, no focal neurological deficits  Data Reviewed: Basic Metabolic Panel:  Lab 12/08/11 8119  NA 122*  K 4.3  CL 94*  CO2 17*  GLUCOSE 77  BUN 43*  CREATININE 2.27*  CALCIUM 8.1*  MG 1.7  PHOS 2.7   Liver Function Tests:  Lab 12/08/11 1757  AST 26  ALT 24  ALKPHOS 140*  BILITOT 0.6  PROT 8.1  ALBUMIN 2.6*   No results found for this basename: LIPASE:5,AMYLASE:5 in the last 168 hours No results found  for this basename: AMMONIA:5 in the last 168 hours CBC:  Lab 12/08/11 1757  WBC 3.8*  NEUTROABS 2.4  HGB 7.2*  HCT 21.8*  MCV 80.7  PLT 70*   Cardiac Enzymes: No results found for this basename: CKTOTAL:5,CKMB:5,CKMBINDEX:5,TROPONINI:5 in the last 168 hours BNP (last 3 results) No results found for this basename: PROBNP:3 in the last 8760 hours CBG: No results found  for this basename: GLUCAP:5 in the last 168 hours  No results found for this or any previous visit (from the past 240 hour(s)).   Studies: No results found.  Scheduled Meds:   . sodium chloride   Intravenous STAT  . insulin aspart  0-9 Units Subcutaneous Custom  . magnesium sulfate 1 - 4 g bolus IVPB  2 g Intravenous Once  . multivitamin with minerals  1 tablet Oral Daily  . ondansetron (ZOFRAN) IV  4 mg Intravenous Once  . pantoprazole  40 mg Oral Q1200  . phenazopyridine  100 mg Oral Once  . general admission iv infusion   Intravenous Once  . Warfarin - Pharmacist Dosing Inpatient   Does not apply q1800  . DISCONTD: warfarin  10 mg Oral Daily   Continuous Infusions:   . TPN (CLINIMIX) +/- additives    . DISCONTD: sodium chloride 1,000 mL (12/08/11 1854)    Principal Problem:  *Acute renal failure Active Problems:  ANEMIA OF CHRONIC DISEASE  Acquired short bowel syndrome  Nutritional deficiency  Anorexia  Dehydration  Hyponatremia    Time spent: 31 minutes    [facility]  [facility] Pager [phone]. If 8PM-8AM, please contact night-coverage at [email or web address], password [facility] 12/09/2011, 10:27 AM  LOS: 1 day

## 2011-12-09 NOTE — Progress Notes (Addendum)
Courtesy Note:  [facility]   Ms. Chauca is a long-standing patient of mine.  She has been on home TPN and ok for a long time until this past year when began to have intermittent shortages of lipids, MVI, other nutrients.  Not sure that is causing current problems but she has anemia without bleeding, splenomegaly with varices but normal portal flow on [place], and new renal failure with hyponatremia. She reports recent fever at home also.  She also had pulmonary nodules that improved with antibiotics (outpatien, 2 CT chest exams and now CXR here).  I called Dr. Arthor Captain and he has kindly agreed to consult nephrology and hematology to evaluate and follow-up these problems.  i will discuss TPN with pharmacist.  We will also follow - Dr. Christella Hartigan is hospital GI MD for [place] this week.  Iva Boop, MD, [facility] [phone] (pager) 12/09/2011 5:16 PM  Renal US shows small kidneys consistent with medical renal disease  It turns out chronic TPN can lead to chronic renal failure -  May be root cause of much of her problems - anemia of chronic disease, etc.  Iva Boop, MD, [facility] [phone] (pager) 12/09/2011 7:41 PM

## 2011-12-09 NOTE — Telephone Encounter (Signed)
Please ask [name] [name] if she is feeling better  Started IVF's and I think she also got transfused again

## 2011-12-09 NOTE — Progress Notes (Addendum)
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 Iohexol Itching    IVP Dye ok if taken with benadryl  . Penicillins Itching    Patient Measurements: Height: 5\' 8"  (172.7 cm) Weight: 160 lb (72.576 kg) IBW/kg (Calculated) : 63.9  Heparin Dosing Weight:   Vital Signs: Temp: 98 F (36.7 C) (07/30 0550) Temp src: Oral (07/30 0550) BP: 106/62 mmHg (07/30 0550) Pulse Rate: 68  (07/30 0550)  Labs:  Basename 12/08/11 1757  HGB 7.2*  HCT 21.8*  PLT 70*  APTT --  LABPROT 42.1*  INR 4.33*  HEPARINUNFRC --  CREATININE 2.27*  CKTOTAL --  CKMB --  TROPONINI --    Estimated Creatinine Clearance: 26.3 ml/min (by C-G formula based on Cr of 2.27).   Medical History: Past Medical History  Diagnosis Date  . Short bowel syndrome   . Abnormal LFTs   . Vitamin d deficiency   . Personal history of colonic polyps   . Thrombophilia   . Diabetes mellitus     resolved after weight loss  . Obesity     prior to weight loss  . Allergic rhinosinusitis   . At risk for dental problems   . Fracture of left clavicle   . Osteoporosis   . Anemia of chronic disease   . Renal insufficiency   . Atypical nevus   . Pathologic fracture of neck of femur   . Pancytopenia 10/07/2011  . Small bowel ischemia   . Superior mesenteric artery stenosis   . Bacterial overgrowth syndrome     Medications:  Prescriptions prior to admission  Medication Sig Dispense Refill  . calcium carbonate (TUMS EX) 750 MG chewable tablet Chew 2 tablets by mouth 3 (three) times daily. Calcium      . ciprofloxacin (CIPRO) 500 MG tablet TAKE 1 TABLET BY MOUTH TWICE DAILY FOR 1 WEEK AT A TIME INTERMITTENTLY AS DIRECTED FOR BACTERIAL OVERGROWTH  28 tablet  4  . diphenhydrAMINE (BENADRYL) 25 mg capsule Take 25 mg by mouth every 6 (six) hours as needed. For  itching/sleep.      . Eflornithine HCl (VANIQA) 13.9 % cream Apply 1 application topically 2 (two) times daily with a meal. Applied to facial hair.      . Multiple Vitamin (MULTIVITAMIN WITH MINERALS) TABS Take 1 tablet by mouth daily.      Marland Kitchen omeprazole (PRILOSEC) 40 MG capsule Take 40 mg by mouth daily.      . Parenteral Electrolytes (TPN ELECTROLYTES IV) Inject 1 each into the vein at bedtime.       . promethazine (PHENERGAN) 25 MG tablet Take 25 mg by mouth every 6 (six) hours as needed. For nausea.      . ursodiol (ACTIGALL) 300 MG capsule TAKE 1 CAPSULE BY MOUTH TWICE DAILY  60 capsule  5  . Vitamin D, Ergocalciferol, (DRISDOL) 50000 UNITS CAPS ALTERNATE TAKING 1 CAPSULE BY MOUTH EVERY OTHER DAY AND 2 CAPSULES BY MOUTH EVERY OTHER DAY  45 capsule  1  . warfarin (COUMADIN) 10 MG tablet Take 1 tablet (10 mg total) by mouth daily.  60 tablet  12    Assessment: Patient on chronic warfarin for thrombosis with small bowel ischemia and proximal superior mesentery artery stenosis  Goal noted on 5/28 Dr. Welton Flakes note of 2.5-3.  INR > 4 Goal of Therapy:  INR 2.5-3  Plan:  Daily INR, no warfarin today.  Darlina Guys, Julian Crowford 12/09/2011,6:18 AM   ADDENDUM: Pt currently receiving PRBC units. Must wait 2 hours after infusion complete for INR to be drawn.  1) Reschedule today's INR for 3pm (per IV team suggestion) 2) Resume warfarin dosing once INR <3.  Darrol Angel, PharmD Pager: [phone] 12/09/2011 9:54 AM

## 2011-12-09 NOTE — Telephone Encounter (Addendum)
No answer and machine is full.  I will continue to try and reach the patient

## 2011-12-09 NOTE — Consult Note (Signed)
Referring Provider: [facility]istPrimary Care Physician:  [name], MD Primary Gastroenterologist:  Dr.Gessner  Reason for Consultation:   Short bowel, fatigue  Anemia, fever  HPI: [name] [name] is a 61 y.o. female well-known to Dr. Leone Payor, though not seen frequently in the office recently . 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 She had her surgery proximally 7 years ago. She has required ongoing management with TPN at home as well as intermittent fluids currently she is on a regimen of TPN 10 hours per day. She is able to eat a regular diet to says recently her appetite has not been as good. She has been maintaining her weight.  Other ongoing problems include anemia of chronic disease for which she is under the care of Dr Welton Flakes; receiving Aranesp injections Q4 weeks - over the past several months she has had worsening anemia and required 2 units of packed RBC secondary to symptomatic anemia at the beginning of July and on admission yesterday hemoglobin back in the 7 range and is receiving 2 units of packed RBCs currently.Bone  Marrow biopsy has been mentioned but has not been done to date. She was also recently found to have splenomegaly, and there's question of underlying liver disease, however recent ultrasound with Doppler shows appropriate flow. She also has mediastinal adenopathy and peripheral pulmonary  nodules which have been followed on CT scan. Her last CT scan was done in May of 2013 and at that time these appeared to be stable to improved - the mediastinal adenopathy was stable as well. These are suspected to be infectious versus inflammatory there has been question of an underlying atypical infection  At this time patient says that she really just has not felt well over the past month or so with decreased energy level, exertional dyspnea and decreased  appetite. Last week she began noticing intermittent fevers to as high as 101 for at home This was associated with an increase in nausea as well as low back upper thigh and lower abdominal discomfort and pressure. She had noted some urinary frequency and a small amount of blood in her urine as well. She has not had any change in her bowel habits and tends to have loose stools where from 2-10 per day pending on what she eats She is treated periodically for bacterial overgrowth with Cipro says that she has not taken a course in the past 6 weeks.   Past Medical History  Diagnosis Date  . Short bowel syndrome   . Abnormal LFTs   . Vitamin d deficiency   . Personal history of colonic polyps   . Thrombophilia   . Diabetes mellitus     resolved after weight loss  . Obesity     prior to weight loss  . Allergic rhinosinusitis   . At risk for dental problems   . Fracture of left clavicle   . Osteoporosis   . Anemia of chronic disease   . Renal insufficiency   . Atypical nevus   . Pathologic fracture of neck of femur   . Pancytopenia 10/07/2011  . Small bowel ischemia   . Superior mesenteric artery stenosis   . Bacterial overgrowth syndrome     Past Surgical History  Procedure Date  . Small intestine surgery 2005    multiple with right colon resection for ischemia/infarct  . Cholecystectomy   . Orif proximal femoral fracture w/ itst  nail system 03/2007    left, Dr. Simonne Come  . Esophagogastroduodenoscopy 01/22/2009    erosive esophagitis  . Colonoscopy 12/05/2005    internal hemorrhoids (for polyp surveillance)    Prior to Admission medications   Medication Sig Start Date End Date Taking? Authorizing Provider  calcium carbonate (TUMS EX) 750 MG chewable tablet Chew 2 tablets by mouth 3 (three) times daily. Calcium   Yes Historical Provider, MD  ciprofloxacin (CIPRO) 500 MG tablet TAKE 1 TABLET BY MOUTH TWICE DAILY FOR 1 WEEK AT A TIME INTERMITTENTLY AS DIRECTED FOR BACTERIAL OVERGROWTH  11/10/11  Yes Iva Boop, MD  diphenhydrAMINE (BENADRYL) 25 mg capsule Take 25 mg by mouth every 6 (six) hours as needed. For itching/sleep.   Yes Historical Provider, MD  Eflornithine HCl (VANIQA) 13.9 % cream Apply 1 application topically 2 (two) times daily with a meal. Applied to facial hair.   Yes Historical Provider, MD  Multiple Vitamin (MULTIVITAMIN WITH MINERALS) TABS Take 1 tablet by mouth daily.   Yes Historical Provider, MD  omeprazole (PRILOSEC) 40 MG capsule Take 40 mg by mouth daily. 11/20/10  Yes Iva Boop, MD  Parenteral Electrolytes (TPN ELECTROLYTES IV) Inject 1 each into the vein at bedtime.    Yes Historical Provider, MD  promethazine (PHENERGAN) 25 MG tablet Take 25 mg by mouth every 6 (six) hours as needed. For nausea.   Yes Historical Provider, MD  ursodiol (ACTIGALL) 300 MG capsule TAKE 1 CAPSULE BY MOUTH TWICE DAILY 07/11/11  Yes Iva Boop, MD  Vitamin D, Ergocalciferol, (DRISDOL) 50000 UNITS CAPS ALTERNATE TAKING 1 CAPSULE BY MOUTH EVERY OTHER DAY AND 2 CAPSULES BY MOUTH EVERY OTHER DAY 10/15/11  Yes Iva Boop, MD  warfarin (COUMADIN) 10 MG tablet Take 1 tablet (10 mg total) by mouth daily. 09/18/11 09/17/12 Yes Victorino December, MD    Current Facility-Administered Medications  Medication Dose Route Frequency Provider Last Rate Last Dose  . 0.9 %  sodium chloride infusion   Intravenous STAT Toy Baker, MD      . acetaminophen (TYLENOL) tablet 650 mg  650 mg Oral Q4H PRN Leanne Chang, NP   650 mg at 12/09/11 0450  . diphenhydrAMINE (BENADRYL) capsule 25 mg  25 mg Oral Q6H PRN Leanne Chang, NP   25 mg at 12/09/11 0450  . insulin aspart (novoLOG) injection 0-9 Units  0-9 Units Subcutaneous Custom Annia Belt, PHARMD      . magnesium sulfate IVPB 2 g 50 mL  2 g Intravenous Once Leanne Chang, NP   2 g at 12/09/11 0154  . morphine 2 MG/ML injection 1 mg  1 mg Intravenous Q4H PRN Tarry Kos, MD      . multivitamin with minerals tablet 1  tablet  1 tablet Oral Daily Tarry Kos, MD      . ondansetron [facility]) injection 4 mg  4 mg Intravenous Once Toy Baker, MD   4 mg at 12/08/11 1851  . ondansetron (ZOFRAN) tablet 4 mg  4 mg Oral Q6H PRN Tarry Kos, MD       Or  . ondansetron (ZOFRAN) injection 4 mg  4 mg Intravenous Q6H PRN Tarry Kos, MD      . pantoprazole (PROTONIX) EC tablet 40 mg  40 mg Oral Q1200 Tarry Kos, MD      . phenazopyridine (PYRIDIUM) tablet 100 mg  100 mg Oral Once Leanne Chang, NP   100 mg at 12/09/11 0153  .  sodium chloride 0.9 % 1,000 mL with thiamine 100 mg, folic acid 1 mg, multivitamins adult 10 mL infusion   Intravenous Once Tarry Kos, MD 100 mL/hr at 12/08/11 2339    . sodium chloride 0.9 % injection 10-40 mL  10-40 mL Intracatheter PRN Tarry Kos, MD   10 mL at 12/08/11 2316  . TPN (CLINIMIX) +/- additives   Intravenous Cyclic-See Admin Instructions Annia Belt, PHARMD      . Warfarin - Pharmacist Dosing Inpatient   Does not apply U2725 Clydia Llano, MD      . DISCONTD: 0.9 %  sodium chloride infusion  1,000 mL Intravenous Continuous Toy Baker, MD 100 mL/hr at 12/08/11 1854 1,000 mL at 12/08/11 1854  . DISCONTD: warfarin (COUMADIN) tablet 10 mg  10 mg Oral Daily Tarry Kos, MD        Allergies as of 12/08/2011 - Review Complete 12/08/2011  Allergen Reaction Noted  . Iohexol Itching 11/14/2010  . Penicillins Itching 01/19/2008    Family History  Problem Relation Age of Onset  . Diabetes Mother   . Hypertension Mother   . Colon cancer Neg Hx     History   Social History  . Marital Status: Married    Spouse Name: N/A    Number of Children: N/A  . Years of Education: N/A   Occupational History  . Not on file.   Social History Main Topics  . Smoking status: Current Some Day Smoker    Types: Cigarettes    Last Attempt to Quit: 05/12/2004  . Smokeless tobacco: Not on file  . Alcohol Use: No  . Drug Use: No  . Sexually Active: Not on file    Other Topics Concern  . Not on file   Social History Narrative  . No narrative on file    Review of Systems: Pertinent positive and negative review of systems were noted in the above HPI section.  All other review of systems was otherwise negative. Physical Exam: Vital signs in last 24 hours: Temp:  [97.8 F (36.6 C)-99.3 F (37.4 C)] 98.1 F (36.7 C) (07/30 0918) Pulse Rate:  [65-86] 69  (07/30 0918) Resp:  [16-25] 18  (07/30 0918) BP: (106-139)/(56-77) 139/74 mmHg (07/30 0918) SpO2:  [93 %-99 %] 98 % (07/30 0918) Weight:  [158 lb 8.2 oz (71.9 kg)-160 lb (72.576 kg)] 160 lb (72.576 kg) (07/30 0500)   General:   Alert,  Well-developed, well-nourished, pleasant and cooperative in NAD Head:  Normocephalic and atraumatic. Eyes:  Sclera clear, no icterus.   Conjunctiva pale. Ears:  Normal auditory acuity. Nose:  No deformity, discharge,  or lesions. Mouth:  No deformity or lesions.   Neck:  Supple; no masses or thyromegaly. Lungs:  Clear throughout to auscultation.   No wheezes, crackles, or rhonchi. Heart:  Regular rate and rhythm; no murmurs, clicks, rubs,  or gallops. Abdomen:  Soft,nontender, BS active,nonpalp mass or hsm,.loose skin lower abdomen ,midline incisional scars Rectal:  Deferred  Msk:  Symmetrical without gross deformities. . Pulses:  Normal pulses noted. Extremities:  Without clubbing or edema.-PICC line LUE Neurologic:  Alert and  oriented x4;  grossly normal neurologically. Skin:  Intact without significant lesions or rashes.. Psych:  Alert and cooperative. Normal mood and affect.  Intake/Output from previous day: 07/29 0701 - 07/30 0700 In: 250 [I.V.:250] Out: -  Intake/Output this shift: Total I/O In: 137.5 [Blood:137.5] Out: -   Lab Results:  Basename 12/08/11 1757  WBC 3.8*  HGB 7.2*  HCT 21.8*  PLT 70*   BMET  Basename 12/08/11 1757  NA 122*  K 4.3  CL 94*  CO2 17*  GLUCOSE 77  BUN 43*  CREATININE 2.27*  CALCIUM 8.1*    LFT  Basename 12/08/11 1757  PROT 8.1  ALBUMIN 2.6*  AST 26  ALT 24  ALKPHOS 140*  BILITOT 0.6  BILIDIR --  IBILI --   PT/INR  Basename 12/08/11 1757  LABPROT 42.1*  INR 4.33*      IMPRESSION:  #36 61 year old female with history of short gut syndrome status post extensive small bowel resection secondary to ischemia in approximately 7 years ago.  #2 patient on long-term TPN #3 intermittent treatment for bacterial overgrowth #4 progressive pancytopenia-currently on Erin asked but has required 4 units of packed RBCs over the past month due to worsening anemia. Suspect bone marrow biopsy as indicated at this time #5 splenomegaly #6 new hyponatremia #7 acute renal insufficiency #8 mediastinal adenopathy and peripheral pulmonary nodules; stable at time of last CT however her etiology is not known #9 fevers-patient has a PICC line, and line infection needs to be ruled out. #10 chronic anticoagulation secondary history of thrombosis   PLAN: #1 resume regular diet #2 pharmacy consult for TNA #3 would consult Dr.Khan for hematology while patient is hospitalized, as bone marrow biopsy may need to be done #4 blood cultures today, will also check sedimentation rate and CRP #5 she will need repeat CT scan of the chest and CT of the abdomen and pelvis however would like to do these with contrast and will wait until renal function has improved We will follow with you, and discuss with Dr. Leone Payor on his return   [name]  12/09/2011, 9:50 AM      ________________________________________________________________________  [facility] MD note:  I personally examined the patient, reviewed the data and agree with the assessment and plan described above.  Her daughter was in room as well.  Most concerning is her renal failure and I suspect that is related to short gut, dehyration. Agree with fluids, restarting TPN.  She's been pancytopenic, has required 2 units blood twice this  month now. Should contact her hematologist while in hosp.  Fevers not explained, PICC should be pulled.  I will leave this to her primary team.  Gattex (new med that can lessen need for TPN in patients with short gut) has been considered but would have to be done at center with more experience with it (Dr. Leone Payor was setting up [facility] referral).   Rob Bunting, MD [facility] Pager [phone]

## 2011-12-09 NOTE — Telephone Encounter (Signed)
Per Dr. Leone Payor the patient is currently hospitalized.

## 2011-12-09 NOTE — Progress Notes (Signed)
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 Iohexol Itching    IVP Dye ok if taken with benadryl  . Penicillins Itching   Patient Measurements: Height: 5\' 8"  (172.7 cm) Weight: 160 lb (72.576 kg) IBW/kg (Calculated) : 63.9   Vital Signs: Temp: 98.4 F (36.9 C) (07/30 1430) Temp src: Oral (07/30 1430) BP: 124/74 mmHg (07/30 1430) Pulse Rate: 62  (07/30 1430)  Labs:  Basename 12/09/11 1730 12/08/11 1757  HGB 8.0* 7.2*  HCT 24.2* 21.8*  PLT 77* 70*  APTT -- --  LABPROT 21.4* 42.1*  INR 1.82* 4.33*  HEPARINUNFRC -- --  CREATININE 2.38* 2.27*  CKTOTAL -- --  CKMB -- --  TROPONINI -- --   Estimated Creatinine Clearance: 25 ml/min (by C-G formula based on Cr of 2.38).  Medications:  Prescriptions prior to admission  Medication Sig Dispense Refill  . ADULT TPN Inject 2,100 mLs into the vein continuous. Infuse over 10 hours with 1 hour ramp up and 1 hour ramp down. Total bag per day provides: 250g Dextrose 70%, 100g Travasol 10%, 45g Intralipids 30%, various electrolytes, MVI, TE, Zinc, selenium.      . calcium carbonate (TUMS EX) 750 MG chewable tablet Chew 2 tablets by mouth 3 (three) times daily. Calcium      . ciprofloxacin (CIPRO) 500 MG tablet TAKE 1 TABLET BY MOUTH TWICE DAILY FOR 1 WEEK AT A TIME INTERMITTENTLY AS DIRECTED FOR BACTERIAL OVERGROWTH  28 tablet  4  . diphenhydrAMINE (BENADRYL) 25 mg capsule Take 25 mg by mouth every 6 (six) hours as needed. For itching/sleep.      . Eflornithine HCl (VANIQA) 13.9 % cream Apply 1 application topically 2 (two) times daily with a meal. Applied to facial hair.      . Multiple Vitamin (MULTIVITAMIN WITH MINERALS) TABS Take 1 tablet by mouth daily.      Marland Kitchen omeprazole (PRILOSEC) 40 MG capsule Take 40 mg by mouth daily.      . Parenteral Electrolytes (TPN  ELECTROLYTES IV) Inject 1 each into the vein at bedtime.       . promethazine (PHENERGAN) 25 MG tablet Take 25 mg by mouth every 6 (six) hours as needed. For nausea.      . ursodiol (ACTIGALL) 300 MG capsule TAKE 1 CAPSULE BY MOUTH TWICE DAILY  60 capsule  5  . Vitamin D, Ergocalciferol, (DRISDOL) 50000 UNITS CAPS ALTERNATE TAKING 1 CAPSULE BY MOUTH EVERY OTHER DAY AND 2 CAPSULES BY MOUTH EVERY OTHER DAY  45 capsule  1  . warfarin (COUMADIN) 10 MG tablet Take 1 tablet (10 mg total) by mouth daily.  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 Welton Flakes note of 2.5-3.   INR >4 on admission.  INR now <2 after transfusions. Spoke with [facility], who will order Lovenox bridging until INR is in therapeutic range.  SCr elevated, CrCl ~48ml/min.  Goal of Therapy:  INR 2.5-3. Monitor platelets daily.   Plan:   Give Coumadin 10mg  tonight.  Lovenox 70mg  SQ q24h.  F/u daily CBC and INR.  Charolotte Eke, PharmD, pager [phone]. 12/09/2011,7:41 PM.

## 2011-12-10 DIAGNOSIS — E860 Dehydration: Secondary | ICD-10-CM

## 2011-12-10 DIAGNOSIS — D61818 Other pancytopenia: Principal | ICD-10-CM

## 2011-12-10 LAB — CBC
Hemoglobin: 7.7 g/dL — ABNORMAL LOW (ref 12.0–15.0)
MCH: 27.7 pg (ref 26.0–34.0)
MCV: 82.4 fL (ref 78.0–100.0)
Platelets: 84 10*3/uL — ABNORMAL LOW (ref 150–400)
RBC: 2.78 MIL/uL — ABNORMAL LOW (ref 3.87–5.11)

## 2011-12-10 LAB — TYPE AND SCREEN
ABO/RH(D): B POS
Antibody Screen: NEGATIVE
Unit division: 0
Unit division: 0

## 2011-12-10 LAB — C-REACTIVE PROTEIN: CRP: 3.2 mg/dL — ABNORMAL HIGH (ref <0.60)

## 2011-12-10 LAB — COMPREHENSIVE METABOLIC PANEL
AST: 22 U/L (ref 0–37)
Albumin: 2.4 g/dL — ABNORMAL LOW (ref 3.5–5.2)
Calcium: 7.6 mg/dL — ABNORMAL LOW (ref 8.4–10.5)
Creatinine, Ser: 2.42 mg/dL — ABNORMAL HIGH (ref 0.50–1.10)

## 2011-12-10 LAB — DIFFERENTIAL
Eosinophils Relative: 2 % (ref 0–5)
Lymphocytes Relative: 25 % (ref 12–46)
Lymphs Abs: 0.9 10*3/uL (ref 0.7–4.0)
Monocytes Absolute: 0.6 10*3/uL (ref 0.1–1.0)

## 2011-12-10 LAB — TRIGLYCERIDES: Triglycerides: 61 mg/dL (ref <150)

## 2011-12-10 LAB — GLUCOSE, CAPILLARY
Glucose-Capillary: 96 mg/dL (ref 70–99)
Glucose-Capillary: 92 mg/dL (ref 70–99)

## 2011-12-10 LAB — PHOSPHORUS: Phosphorus: 4.7 mg/dL — ABNORMAL HIGH (ref 2.3–4.6)

## 2011-12-10 LAB — MAGNESIUM: Magnesium: 2.1 mg/dL (ref 1.5–2.5)

## 2011-12-10 MED ORDER — DIPHENOXYLATE-ATROPINE 2.5-0.025 MG PO TABS
1.0000 | ORAL_TABLET | Freq: Three times a day (TID) | ORAL | Status: DC
  Administered 2011-12-10 – 2011-12-17 (×17): 1 via ORAL
  Filled 2011-12-10 (×19): qty 1

## 2011-12-10 MED ORDER — FAT EMULSION 20 % IV EMUL
240.0000 mL | INTRAVENOUS | Status: AC
  Administered 2011-12-10: 240 mL via INTRAVENOUS
  Filled 2011-12-10: qty 250

## 2011-12-10 MED ORDER — ZINC TRACE METAL 1 MG/ML IV SOLN
INTRAVENOUS | Status: DC
  Filled 2011-12-10: qty 1000

## 2011-12-10 MED ORDER — ZINC TRACE METAL 1 MG/ML IV SOLN
INTRAVENOUS | Status: AC
  Administered 2011-12-10: 18:00:00 via INTRAVENOUS
  Filled 2011-12-10: qty 1000

## 2011-12-10 MED ORDER — BOOST / RESOURCE BREEZE PO LIQD
1.0000 | Freq: Every day | ORAL | Status: DC
  Administered 2011-12-10: 1 via ORAL

## 2011-12-10 MED ORDER — B COMPLEX-C PO TABS
1.0000 | ORAL_TABLET | Freq: Every day | ORAL | Status: DC
  Administered 2011-12-10 – 2011-12-17 (×7): 1 via ORAL
  Filled 2011-12-10 (×8): qty 1

## 2011-12-10 MED ORDER — ENSURE COMPLETE PO LIQD
237.0000 mL | Freq: Every day | ORAL | Status: DC
  Administered 2011-12-11 – 2011-12-17 (×3): 237 mL via ORAL

## 2011-12-10 MED ORDER — WARFARIN SODIUM 10 MG PO TABS
10.0000 mg | ORAL_TABLET | Freq: Once | ORAL | Status: AC
  Administered 2011-12-10: 10 mg via ORAL
  Filled 2011-12-10: qty 1

## 2011-12-10 NOTE — Progress Notes (Signed)
Patient ID: [name] [name], female   DOB: 11/01/1950, 61 y.o.   MRN: [id number] [facility] Progress Note  Subjective: Feels better overall, appetite better. Several Bm's during the night- not unusual for her. No  c/o abdominal pain  ,cultures pending- no further  fever  Objective:  Vital signs in last 24 hours: Temp:  [97.1 F (36.2 C)-98.6 F (37 C)] 97.8 F (36.6 C) (07/31 0511) Pulse Rate:  [60-69] 60  (07/31 0511) Resp:  [18-20] 18  (07/31 0511) BP: (114-148)/(62-81) 118/72 mmHg (07/31 0511) SpO2:  [96 %-98 %] 97 % (07/31 0511) Weight:  [157 lb 13.6 oz (71.6 kg)] 157 lb 13.6 oz (71.6 kg) (07/31 0511) Last BM Date: 12/09/11 General:   Alert,  Well-developed,    in NAD Heart:  Regular rate and rhythm; no murmurs Pulm;clear Abdomen:  Soft, nontender and nondistended. Normal bowel sounds, without guarding, and without rebound.   Extremities:  Without edema. Neurologic:  Alert and  oriented x4;  grossly normal neurologically. Psych:  Alert and cooperative. Normal mood and affect.  Intake/Output from previous day: 07/30 0701 - 07/31 0700 In: 1627.5 [P.O.:480; I.V.:805; Blood:342.5] Out: -  Intake/Output this shift:    Lab Results:  Basename 12/10/11 0645 12/09/11 1730 12/08/11 1757  WBC 3.6* 3.9* 3.8*  HGB 7.7* 8.0* 7.2*  HCT 22.9* 24.2* 21.8*  PLT 84* 77* 70*   BMET  Basename 12/10/11 0645 12/09/11 1730 12/08/11 1757  NA 130* 128* 122*  K 4.1 3.9 4.3  CL 105 100 94*  CO2 17* 18* 17*  GLUCOSE 131* 64* 77  BUN 40* 37* 43*  CREATININE 2.42* 2.38* 2.27*  CALCIUM 7.6* 7.7* 8.1*   LFT  Basename 12/10/11 0645  PROT 7.5  ALBUMIN 2.4*  AST 22  ALT 20  ALKPHOS 115  BILITOT 0.8  BILIDIR --  IBILI --   PT/INR  Basename 12/09/11 1730 12/08/11 1757  LABPROT 21.4* 42.1*  INR 1.82* 4.33*    Assessment / Plan: Complicated 61 yo female with 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 All secondary to long term TPN R/O hematologic malignancy  Renal and hematology to see today May need eventual referral to [place]- New Rx for short bowel- Gattex  Have added Bvits orally Will try Lomotil 3 x daily for chronic diarrhea Nutrition consult for oral supplements- without Vit k  #2 Chronic anticoagulation secondary to hx of thrombosis #3Hyponatremia-correcting #4 pulmonary nodules, mediastinal adenopathy- on prior CT 5/13- will need follow up #5 splenomegaly    LOS: 2 days   [name]  12/10/2011, 8:58 AM    ________________________________________________________________________  [facility] MD note:  I personally examined the patient, reviewed the data and agree with the assessment and plan described above.  She is getting fluids overnight and repeat BMET to see how Cr responds.  If no response despite correcting any pre-renal state, then she is even more likely to have parenchymal kidney disease.  She is overall feeling better, probably from 2 unit transfusion yesterday.  Eating well.   Rob Bunting, MD [facility] Pager [phone]

## 2011-12-10 NOTE — Progress Notes (Signed)
Nutrition Brief Note  Diet: Regular, intake 100% TNA: Cyclic Clinimix 4.25/25 for 12 hours with cyclic 12 hour 44% fat emulsion at 25ml/hr   - Received consult requesting the addition of nutritional supplements without vitamin K as pt is on Coumadin. Discussed order with PA as our Ensure and Resource Breeze contain some vitamin K and explained how vitamin K is allowed for patients on Coumadin as long as the amount is consistent. PA stated pt had questions regarding this issue - met with pt and answered questions. Pt had further questions regarding therapy for short gut syndrome and diarrhea. Discussed nutrition therapy for this issues with an emphasis on adequate hydration to prevent electrolyte imbalance and general nutrition therapy for diarrhea. Pt states she drinks a lot of Smart Water at home and stays thirsty. Pt states the chronic TNA has caused her teeth to fall out and have constant dry mouth. Pt states she has tried different foods over the years and noted that high fiber foods like corn pass through her, however lower fiber starches like rice and potatoes "stay" in her. Pt states she has never had a problem with lactose intolerance or greasy foods causing diarrhea. Pt reports she is taking a double dose of a standard multivitamin at home in addition to taking supplemental calcium and vitamin D.   Per the American Society of Parenteral and Enteral Nutrition's Nutrition Support Core Curriculum, pt is at risk for both hyperglycemia and hypoglycemia, essential fatty acid deficiency, hypertriglyceridemia, azotemia, as well as other fluid, electrolyte, vitamin, and trace element imbalances because of her long term TNA use, with parenteral nutrition-associated liver complications being a common complication. Noted pt's urine output so far today has been only . Pt reports feeling her declining urine output PTA was a main reason to get checked into the hospital and may be contributing to her elevated  BUN and creatinine.   Intervention: Ensure Complete daily, [employer] daily. Will continue to monitor meal intake and TNA.   Marshall Cork Dietitian# [phone]

## 2011-12-10 NOTE — Progress Notes (Signed)
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 Iohexol Itching    IVP Dye ok if taken with benadryl  . Penicillins Itching   Patient Measurements: Height: 5\' 8"  (172.7 cm) Weight: 157 lb 13.6 oz (71.6 kg) (bed scale) IBW/kg (Calculated) : 63.9   Vital Signs: Temp: 97.8 F (36.6 C) (07/31 0511) Temp src: Oral (07/31 0511) BP: 118/72 mmHg (07/31 0511) Pulse Rate: 60  (07/31 0511)  Labs:  Basename 12/10/11 0645 12/09/11 1730 12/08/11 1757  HGB 7.7* 8.0* --  HCT 22.9* 24.2* 21.8*  PLT 84* 77* 70*  APTT -- -- --  LABPROT -- 21.4* 42.1*  INR -- 1.82* 4.33*  HEPARINUNFRC -- -- --  CREATININE 2.42* 2.38* 2.27*  CKTOTAL -- -- --  CKMB -- -- --  TROPONINI -- -- --   Estimated Creatinine Clearance: 24.6 ml/min (by C-G formula based on Cr of 2.42).  Medications:  Prescriptions prior to admission  Medication Sig Dispense Refill  . ADULT TPN Inject 2,100 mLs into the vein continuous. Infuse over 10 hours with 1 hour ramp up and 1 hour ramp down. Total bag per day provides: 250g Dextrose 70%, 100g Travasol 10%, 45g Intralipids 30%, various electrolytes, MVI, TE, Zinc, selenium.      . calcium carbonate (TUMS EX) 750 MG chewable tablet Chew 2 tablets by mouth 3 (three) times daily. Calcium      . ciprofloxacin (CIPRO) 500 MG tablet TAKE 1 TABLET BY MOUTH TWICE DAILY FOR 1 WEEK AT A TIME INTERMITTENTLY AS DIRECTED FOR BACTERIAL OVERGROWTH  28 tablet  4  . diphenhydrAMINE (BENADRYL) 25 mg capsule Take 25 mg by mouth every 6 (six) hours as needed. For itching/sleep.      . Eflornithine HCl (VANIQA) 13.9 % cream Apply 1 application topically 2 (two) times daily with a meal. Applied to facial hair.      . Multiple Vitamin (MULTIVITAMIN WITH MINERALS) TABS Take 1 tablet by mouth daily.      Marland Kitchen omeprazole (PRILOSEC) 40 MG capsule  Take 40 mg by mouth daily.      . Parenteral Electrolytes (TPN ELECTROLYTES IV) Inject 1 each into the vein at bedtime.       . promethazine (PHENERGAN) 25 MG tablet Take 25 mg by mouth every 6 (six) hours as needed. For nausea.      . ursodiol (ACTIGALL) 300 MG capsule TAKE 1 CAPSULE BY MOUTH TWICE DAILY  60 capsule  5  . Vitamin D, Ergocalciferol, (DRISDOL) 50000 UNITS CAPS ALTERNATE TAKING 1 CAPSULE BY MOUTH EVERY OTHER DAY AND 2 CAPSULES BY MOUTH EVERY OTHER DAY  45 capsule  1  . warfarin (COUMADIN) 10 MG tablet Take 1 tablet (10 mg total) by mouth daily.  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 Welton Flakes.  Home dose reported as 10 mg daily.   INR >4 on admission.  Warfarin restarted 7/30 after INR <2 after transfusions. Lovenox bridging until INR is in therapeutic range.  SCr elevated, CrCl ~90ml/min.  INR remains subtherapeutic but is increasing.  Platelets appear chronically low, monitoring closely, currently stable.  Goal of Therapy:  INR 2.5-3. Monitor platelets daily.   Plan:   Repeat Coumadin 10mg  tonight.  Lovenox  70mg  SQ q24h.  F/u daily CBC and INR.  Clance Boll, PharmD, BCPS Pager: [phone] 12/10/2011 9:22 AM

## 2011-12-10 NOTE — Progress Notes (Addendum)
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 Patient came in to the hospital with acute anemia and dehydration.  Assessment/Plan:  Anemia  -Patient has history of chronic anemia secondary to chronic disease, she follows with Dr. Welton Flakes -Patient hemoglobin usually between 8.5-9, she came in with hemoglobin of 7.2.  -Status post transfusion of 2 units of packed RBCs, post-transfusion CBC improved -She had recent 5 negative cards for FOBT on 11/03/2011. She has negative EGD/colonoscopy last year.  -appreciate GI eval. i discussed with her hematologist Dr Welton Flakes who feels her chronic pancytopenia is due to chronic underlying illness and given her anemia at baseline would not need bone marrow bx as inpatient and will plan as outpatient -gets monthly arenesp injection  Short gut syndrome  -Patient is on total parenteral nutrition secondary to short gut (reported 1 foot left of her small bowel).  -Patient mentioned of missing out on nutrients and MVs  Due to shortage in the injectable multivitamins.  -Continue TPN, multivitamins per pharmacy.   Acute renal failure  - likely secondary to dehydration. Renal USG unremarkable -Continue IV fluids and check BMP in a.m. Will increase rate to 125 cc/hr.   -as per GI could possibly be associated with TPN as well. i have discussed with Dr Arlean Hopping who recommends continue IV hydration for 1 more day and see for improvement . If renal function unimproved he will consult on patient today.   Hyponatremia  -This is likely secondary to dehydration and acute renal failure.  -slowly improved in fluids . Monitor in am   Hematuria  -Patient has painless hematuria occasinally  DVT prophylaxis On  coumadin  Code Status:full  Family Communication: daughter at bedside  Disposition Plan: home once stable     Consultants:  [facility]  Procedures:  None  Antibiotics:  none  HPI/Subjective: Patient seen and examined this am. Feels overall better.  Objective: Filed Vitals:   12/09/11 1430 12/09/11 2017 12/10/11 0511 12/10/11 1428  BP: 124/74 114/62 118/72 123/73  Pulse: 62 62 60 66  Temp: 98.4 F (36.9 C) 98.6 F (37 C) 97.8 F (36.6 C) 97.7 F (36.5 C)  TempSrc: Oral Oral Oral Oral  Resp: 18 18 18 16   Height:      Weight:   71.6 kg (157 lb 13.6 oz)   SpO2: 96% 97% 97% 100%    Intake/Output Summary (Last 24 hours) at 12/10/11 1706 Last data filed at 12/10/11 1427  Gross per 24 hour  Intake 801.67 ml  Output    175 ml  Net 626.67 ml    Exam:   General:  Elderly female in NAD  HEENT: no pallor, dry oral mucosa  Cardiovascular: N S1&S2, no murmurs  Respiratory: clear b/l no added sounds  Abdomen: old laprotomy scar , soft, NT, ND, BS+  CNS: AAO X 3 , non focal  Data Reviewed: Basic Metabolic Panel:  Lab 12/10/11 8119 12/09/11 1730 12/08/11 1757  NA 130* 128* 122*  K 4.1 3.9 4.3  CL 105 100 94*  CO2 17* 18* 17*  GLUCOSE 131* 64* 77  BUN 40* 37* 43*  CREATININE 2.42* 2.38* 2.27*  CALCIUM 7.6* 7.7* 8.1*  MG 2.1 -- 1.7  PHOS 4.7* -- 2.7   Liver Function Tests:  Lab 12/10/11 1478  12/09/11 1730 12/08/11 1757  AST 22 26 26   ALT 20 23 24   ALKPHOS 115 130* 140*  BILITOT 0.8 1.2 0.6  PROT 7.5 8.4* 8.1  ALBUMIN 2.4* 2.7* 2.6*   No results found for this basename: LIPASE:5,AMYLASE:5 in the last 168 hours No results found for this basename: AMMONIA:5 in the last 168 hours CBC:  Lab 12/10/11 0645 12/09/11 1730 12/08/11 1757  WBC 3.6* 3.9* 3.8*  NEUTROABS 2.0 -- 2.4  HGB 7.7* 8.0* 7.2*  HCT 22.9* 24.2* 21.8*  MCV 82.4 81.5 80.7  PLT 84* 77* 70*   Cardiac Enzymes: No results found for this basename:  CKTOTAL:5,CKMB:5,CKMBINDEX:5,TROPONINI:5 in the last 168 hours BNP (last 3 results) No results found for this basename: PROBNP:3 in the last 8760 hours CBG:  Lab 12/10/11 1143 12/10/11 0802 12/09/11 1958  GLUCAP 92 103* 106*    No results found for this or any previous visit (from the past 240 hour(s)).   Studies: Dg Chest 2 View  12/09/2011  *RADIOLOGY REPORT*  Clinical Data: Fever and weakness.  Ex-smoker.  CHEST - 2 VIEW  Comparison: CT of 10/24/2011 and plain film of 01/17/2011.  Findings: A left-sided PICC line which is unchanged and terminates at the low SVC. Midline trachea.  Borderline cardiomegaly.  Age advanced aortic atherosclerosis. No pleural effusion or pneumothorax.  The reticular nodular opacities described on the prior plain film are improved to resolved.  Mild interstitial prominence remains, and is likely related to the clinical history of  prior smoking. No lobar consolidation.  IMPRESSION: Interstitial thickening, likely related to prior smoking / chronic bronchitis.  No evidence of acute superimposed pneumonia.  Original Report Authenticated By: Consuello Bossier, M.D.   US Renal  12/09/2011  *RADIOLOGY REPORT*  Clinical Data: Hematuria.  History of diabetes.  RENAL/URINARY TRACT ULTRASOUND COMPLETE  Comparison:  Abdominal ultrasound 10/29/2011.  Findings:  Right Kidney:  Mild diffuse increased echogenicity of the renal parenchyma is nonspecific.  No focal cystic or solid renal lesions. 12.9 cm in length.  No hydronephrosis. No definite calculi.  Left Kidney:  Mild diffuse increased echogenicity of the renal parenchyma is nonspecific.  No focal cystic or solid renal lesions. 13.6 cm in length.  No hydronephrosis. No definite calculi.  Bladder:  Well distended without focal wall abnormalities.  IMPRESSION: 1.  Mild diffusely increased echogenicity of the the renal parenchyma bilaterally is nonspecific, but could suggest underlying medical renal disease. 2.  No focal cystic or solid  renal lesions, and no definite evidence of calculi within the collecting systems.  Original Report Authenticated By: Florencia Reasons, M.D.    Scheduled Meds:   . B-complex with vitamin C  1 tablet Oral Daily  . diphenoxylate-atropine  1 tablet Oral TID  . enoxaparin (LOVENOX) injection  70 mg Subcutaneous Q24H  . feeding supplement  237 mL Oral Daily  . feeding supplement  1 Container Oral Q1400  . multivitamin with minerals  1 tablet Oral Daily  . pantoprazole  40 mg Oral Q1200  . warfarin  10 mg Oral Once  . warfarin  10 mg Oral ONCE-1800  . Warfarin - Pharmacist Dosing Inpatient   Does not apply q1800  . DISCONTD: insulin aspart  0-9 Units Subcutaneous Custom   Continuous Infusions:   . sodium chloride 125 mL/hr at 12/10/11 1601  . fat emulsion    . TPN (CLINIMIX) +/- additives    . TPN (CLINIMIX) +/- additives    . DISCONTD: TPN (CLINIMIX) +/- additives  Time spent: 35 minutes    Lydie Stammen  [facility] Pager [phone]. If 8PM-8AM, please contact night-coverage at [email or web address], password [email or web address] 12/10/2011, 5:06 PM  LOS: 2 days

## 2011-12-10 NOTE — Progress Notes (Addendum)
PARENTERAL NUTRITION CONSULT NOTE - Follow Up  Pharmacy Consult for TNA Indication: Short Gut Syndrome  Allergies  Allergen Reactions  . Iohexol Itching    IVP Dye ok if taken with benadryl  . Penicillins Itching    Patient Measurements: Height: 5\' 8"  (172.7 cm) Weight: 157 lb 13.6 oz (71.6 kg) (bed scale) IBW/kg (Calculated) : 63.9  Adjusted Body Weight: 67.4kg Usual Weight: ~155 lbs (70.4kg) - per pt report  Vital Signs: Temp: 97.8 F (36.6 C) (07/31 0511) Temp src: Oral (07/31 0511) BP: 118/72 mmHg (07/31 0511) Pulse Rate: 60  (07/31 0511) Intake/Output from previous day: 07/30 0701 - 07/31 0700 In: 1627.5 [P.O.:480; I.V.:805; Blood:342.5] Out: -  Intake/Output from this shift:    Labs:  [facility] 12/10/11 0645 12/09/11 1730 12/08/11 1757  WBC 3.6* 3.9* 3.8*  HGB 7.7* 8.0* 7.2*  HCT 22.9* 24.2* 21.8*  PLT 84* 77* 70*  APTT -- -- --  INR -- 1.82* 4.33*     Basename 12/10/11 0645 12/09/11 1730 12/08/11 1757  NA 130* 128* 122*  K 4.1 3.9 4.3  CL 105 100 94*  CO2 17* 18* 17*  GLUCOSE 131* 64* 77  BUN 40* 37* 43*  CREATININE 2.42* 2.38* 2.27*  LABCREA -- -- --  CREAT24HRUR -- -- --  CALCIUM 7.6* 7.7* 8.1*  MG 2.1 -- 1.7  PHOS 4.7* -- 2.7  PROT 7.5 8.4* 8.1  ALBUMIN 2.4* 2.7* 2.6*  AST 22 26 26   ALT 20 23 24   ALKPHOS 115 130* 140*  BILITOT 0.8 1.2 0.6  BILIDIR -- -- --  IBILI -- -- --  PREALBUMIN -- -- --  TRIG -- -- --  CHOLHDL -- -- --  CHOL -- -- --   Estimated Creatinine Clearance: 24.6 ml/min (by C-G formula based on Cr of 2.42).    Basename 12/09/11 1958  GLUCAP 106*    Medical History: Past Medical History  Diagnosis Date  . Short bowel syndrome   . Abnormal LFTs   . Vitamin d deficiency   . Personal history of colonic polyps   . Thrombophilia   . Diabetes mellitus     resolved after weight loss  . Obesity     prior to weight loss  . Allergic rhinosinusitis   . At risk for dental problems   . Fracture of left clavicle   .  Osteoporosis   . Anemia of chronic disease   . Renal insufficiency   . Atypical nevus   . Pathologic fracture of neck of femur   . Pancytopenia 10/07/2011  . Small bowel ischemia   . Superior mesenteric artery stenosis   . Bacterial overgrowth syndrome     Medications:  Scheduled:     . enoxaparin (LOVENOX) injection  70 mg Subcutaneous Q24H  . insulin aspart  0-9 Units Subcutaneous Custom  . multivitamin with minerals  1 tablet Oral Daily  . pantoprazole  40 mg Oral Q1200  . warfarin  10 mg Oral Once  . Warfarin - Pharmacist Dosing Inpatient   Does not apply q1800  . DISCONTD: sodium chloride   Intravenous STAT   Infusions:     . sodium chloride 100 mL/hr at 12/09/11 1142  . TPN (CLINIMIX) +/- additives      Insulin Requirements in the past 24 hours:  1 unit of SSI recorded  Nutritional Goals:  Per RD assessment 7/30: Kcal: 1600-1750 kcal, Protein: 58-72g , Fluid: 2.5 L  Goal TNA(adjusting per RD recommendations) = Clinimx E 4.25/25 -  infused over 10-12 hours plus IV lipids over 10-12hr on MWF to provide 61g Protein/day, 1948 kcal/day on lipid days, 1468 on non-lipid days, for an average of 1674 kcal/day.  Current Nutrition:  Regular diet, PO intake recorded but pt not likely absorbing (see assessment below) Chronic home TNA  Assessment: 61 yo F admitted 12/08/11 with CC: low grade fever and inability to eat for several months, found to be in worsening renal failure, ultimately admitted for dehydration/renal failure. 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 She is on chronic TNA at home (for 7 years, per pt report) infused via PICC (placed 08/13/11 at [facility] per report) Pt also has hx of abnormal LFTs, DM ("resolved after weight loss"), erosive esophagitis, anemia of chronic dz, and renal insufficiency. She is on abx frequently for bacteria overgrowth in bowel. TNA resumed 7/30  inpatient.  Pharmacist had lengthy discussion with patient on 7/30, who reports that she's been on TNA for 7 years. She reports that she's allowed to eat "whatever she wants" at home in addition to the TNA, but the food "usually passes right though her" (her daughter estimates within 30 mins). For the past couple weeks, patient reports having no appetite; however, she mentions she is hungry this morning. Spoke with a family member over the phone (granddaughter) who read her home TNA label to me, which indicates: 2100 ml total cycled over 10 hours with a 1 hour ramp up and 1 hour ramp down. TNA bag ( ) contains: 250g Dextrose, 100g Travasol 10%, 45g Intralipids 30%, MVI, TE, Zinc, Selenium, and various electrolytes. Per patient report, TNA bag also contains B-complex vitamins and folic acid, however this information was not relayed to me over the phone. Bag does not contain any insulin. This home TNA provides approx 45g Protein/day and 1675 Kcal/day. Patient is agreeable to using [place] supplied TNA and is ok if TNA is cycled over 12 hours instead of usual 10 hours.  TNA goals adjusted to RD's recommendations inpatient: kcal goal remains the same; however, RD recommends a higher protein goal.  Advancing to higher protein supplementation is limited by the patient's current renal failure.  Labs: Lytes: Hyponatremia (unable to adjust Na+ content of TNA, improving with IVF per MD). Renal: SCr elevated and increasing. CrCl ~ 42ml/min. This will limit amount of protein in TNA initially. LFTs: wnl, Alk Phos slightly elevated but trended down today CBGs: none, had ordered CBG monitoring q6h yesterday but none recorded. GI PPx: PO Protonix IVF: NS at 195ml/hr per MD   Plan: 1) Since SCr continues to rise, will not advance TNA just yet.  Continue Clinimix E 4.25/25, cycled over 12 hours (6p-6a): 88ml/hr x1hr, 34ml/hr x 10hr, 91ml/hr x1 hour (off 6a-6p). 2) IV lipids, MVI, Folic acid, and TE on  MWF only due to national back order. Will continue to put MVI and TE in TNA due to short gut syndrome and questionable PO absorption (pt has active order for PO MVI).  Thiamine will be added to TNA per MD request.  3) SSI sensitive scale revolving around cyclic admin times (due to hx of DM, even though reportedly resolved) 4) IVF per MD, once dehydration resolved per MD would recommend reducing IVF rate while TNA is running between 6p-6a. 5) TNA labs in AM (qMon/Thurs).  Clance Boll, PharmD, BCPS Pager: [phone] 12/10/2011 8:57 AM

## 2011-12-11 ENCOUNTER — Ambulatory Visit: Payer: BC Managed Care – PPO

## 2011-12-11 ENCOUNTER — Other Ambulatory Visit: Payer: BC Managed Care – PPO

## 2011-12-11 LAB — GLUCOSE, CAPILLARY
Glucose-Capillary: 138 mg/dL — ABNORMAL HIGH (ref 70–99)
Glucose-Capillary: 65 mg/dL — ABNORMAL LOW (ref 70–99)

## 2011-12-11 LAB — URINALYSIS, ROUTINE W REFLEX MICROSCOPIC
Ketones, ur: NEGATIVE mg/dL
Nitrite: NEGATIVE
Protein, ur: 100 mg/dL — AB
Urobilinogen, UA: 0.2 mg/dL (ref 0.0–1.0)

## 2011-12-11 LAB — COMPREHENSIVE METABOLIC PANEL
AST: 24 U/L (ref 0–37)
Albumin: 2.4 g/dL — ABNORMAL LOW (ref 3.5–5.2)
Alkaline Phosphatase: 126 U/L — ABNORMAL HIGH (ref 39–117)
Chloride: 108 mEq/L (ref 96–112)
Potassium: 3.6 mEq/L (ref 3.5–5.1)
Sodium: 131 mEq/L — ABNORMAL LOW (ref 135–145)
Total Bilirubin: 0.5 mg/dL (ref 0.3–1.2)

## 2011-12-11 LAB — CBC
HCT: 22.7 % — ABNORMAL LOW (ref 36.0–46.0)
Hemoglobin: 7.5 g/dL — ABNORMAL LOW (ref 12.0–15.0)
MCH: 27.4 pg (ref 26.0–34.0)
MCHC: 33 g/dL (ref 30.0–36.0)

## 2011-12-11 LAB — URINE MICROSCOPIC-ADD ON

## 2011-12-11 LAB — PROTIME-INR: INR: 2.56 — ABNORMAL HIGH (ref 0.00–1.49)

## 2011-12-11 LAB — SODIUM, URINE, RANDOM: Sodium, Ur: 107 mEq/L

## 2011-12-11 MED ORDER — WARFARIN SODIUM 1 MG PO TABS
1.0000 mg | ORAL_TABLET | Freq: Once | ORAL | Status: AC
  Administered 2011-12-11: 1 mg via ORAL
  Filled 2011-12-11: qty 1

## 2011-12-11 MED ORDER — THIAMINE HCL 100 MG/ML IJ SOLN
INTRAVENOUS | Status: AC
  Administered 2011-12-11: 18:00:00 via INTRAVENOUS
  Filled 2011-12-11: qty 1000

## 2011-12-11 MED ORDER — PROMETHAZINE HCL 25 MG/ML IJ SOLN
12.5000 mg | Freq: Four times a day (QID) | INTRAMUSCULAR | Status: DC | PRN
Start: 2011-12-11 — End: 2011-12-17
  Administered 2011-12-11: 12.5 mg via INTRAVENOUS
  Filled 2011-12-11: qty 1

## 2011-12-11 MED ORDER — FLUCONAZOLE IN SODIUM CHLORIDE 200-0.9 MG/100ML-% IV SOLN
200.0000 mg | Freq: Every day | INTRAVENOUS | Status: DC
  Administered 2011-12-11: 200 mg via INTRAVENOUS
  Filled 2011-12-11 (×2): qty 100

## 2011-12-11 NOTE — Progress Notes (Signed)
Patient ID: [name] [name], female   DOB: 27-Jan-1951, 61 y.o.   MRN: [id number] [facility] Progress Note  Subjective: Feels very cold internally this am- says she has spells of this at home- no rigor-also coughing today/dry cough. Eating well. Lomotil has helped diarrhea  Dr. Welton [name] apparently not planning bone marrow as inpt-will do outpt Renal to see today, as continued hydration has not made significant improvement in renal fxn  Objective:  Vital signs in last 24 hours: Temp:  [97.7 F (36.5 C)-98.6 F (37 C)] 98.3 F (36.8 C) (08/01 0446) Pulse Rate:  [63-66] 64  (08/01 0446) Resp:  [16] 16  (08/01 0446) BP: (111-124)/(66-75) 111/66 mmHg (08/01 0446) SpO2:  [99 %-100 %] 99 % (08/01 0446) Weight:  [161 lb 6 oz (73.2 kg)] 161 lb 6 oz (73.2 kg) (08/01 0446) Last BM Date: 12/10/11 General:   Alert,  Well-developed,    in NAD under multiple covers Heart:  Regular rate and rhythm; no murmurs Pulm;few scattered rhonchi Abdomen:  Soft, nontender and nondistended. Normal bowel sounds, without guarding, and without rebound.   Extremities:  Without edema. Neurologic:  Alert and  oriented x4;  grossly normal neurologically. Psych:  Alert and cooperative. Normal mood and affect.  Intake/Output from previous day: 07/31 0701 - 08/01 0700 In: 5925.5 [P.O.:840; I.V.:3930.8; TPN:1154.7] Out: 175 [Urine:175] Intake/Output this shift:    Lab Results: blood cultures negative so far  Sed rate =95 ,CRP = 3.2high  Basename 12/11/11 0512 12/10/11 0645 12/09/11 1730  WBC 3.2* 3.6* 3.9*  HGB 7.5* 7.7* 8.0*  HCT 22.7* 22.9* 24.2*  PLT 75* 84* 77*   BMET  Basename 12/11/11 0512 12/10/11 0645 12/09/11 1730  NA 131* 130* 128*  K 3.6 4.1 3.9  CL 108 105 100  CO2 14* 17* 18*  GLUCOSE 115* 131* 64*  BUN 32* 40* 37*  CREATININE 2.39* 2.42* 2.38*  CALCIUM 7.2* 7.6* 7.7*   LFT  Basename 12/11/11 0512  PROT 7.7  ALBUMIN 2.4*  AST 24  ALT 22  ALKPHOS 126*  BILITOT 0.5  BILIDIR  --  IBILI --   PT/INR  Basename 12/11/11 0512 12/10/11 1000  LABPROT 27.9* 22.4*  INR 2.56* 1.93*    Assessment / Plan: Complicated 61 yo female with short bowel- on chronic TPN x 7 years. She has developed an anemia of chronic disease which is a pancytopenia-unclear etiology She is followed by dr. Welton [name]- needs a bone marrow bx, also due for Aranesp today  #2 new renal failure- not convinced this is on basis of dehydration -renal to see today #3 splenomegaly #4 pulmonary nodules and mediastinal adenopathy on Ct 5/13 which was improved from several months previous but not resolved #5 markedly elevated CRP and SED rate- am concerned she has an underlying systemic process either inflammatory or malignant contributing to above issues- will discuss- unfortunately cannot do CT or MRI with contrast at present due to renal fxn #6 Chronic anticoagulation -secondary to hx of thrombosis #7 malnutrition- continue TPN, and regular diet with supplements Principal Problem:  *Acute renal failure Active Problems:  ANEMIA OF CHRONIC DISEASE  Acquired short bowel syndrome  Nutritional deficiency  Anorexia  Dehydration  Hyponatremia     LOS: 3 days   [name] [name]  12/11/2011, 9:25 AM

## 2011-12-11 NOTE — Progress Notes (Signed)
PARENTERAL NUTRITION CONSULT NOTE - Follow Up  Pharmacy Consult for TNA Indication: Short Gut Syndrome  Allergies  Allergen Reactions  . Iohexol Itching    IVP Dye ok if taken with benadryl  . Penicillins Itching    Patient Measurements: Height: 5\' 8"  (172.7 cm) Weight: 161 lb 6 oz (73.2 kg) IBW/kg (Calculated) : 63.9  Adjusted Body Weight: 67.4kg Usual Weight: ~155 lbs (70.4kg) - per pt report  Vital Signs: Temp: 98.3 F (36.8 C) (08/01 0446) Temp src: Oral (08/01 0446) BP: 111/66 mmHg (08/01 0446) Pulse Rate: 64  (08/01 0446) Intake/Output from previous day: 07/31 0701 - 08/01 0700 In: 5925.5 [P.O.:840; I.V.:3930.8; TPN:1154.7] Out: 175 [Urine:175] Intake/Output from this shift:    Labs:  [facility] 12/11/11 0512 12/10/11 1000 12/10/11 0645 12/09/11 1730  WBC 3.2* -- 3.6* 3.9*  HGB 7.5* -- 7.7* 8.0*  HCT 22.7* -- 22.9* 24.2*  PLT 75* -- 84* 77*  APTT -- -- -- --  INR 2.56* 1.93* -- 1.82*     Basename 12/11/11 0512 12/10/11 0645 12/09/11 1730 12/08/11 1757  NA 131* 130* 128* --  K 3.6 4.1 3.9 --  CL 108 105 100 --  CO2 14* 17* 18* --  GLUCOSE 115* 131* 64* --  BUN 32* 40* 37* --  CREATININE 2.39* 2.42* 2.38* --  LABCREA -- -- -- --  CREAT24HRUR -- -- -- --  CALCIUM 7.2* 7.6* 7.7* --  MG 1.8 2.1 -- 1.7  PHOS 4.1 4.7* -- 2.7  PROT 7.7 7.5 8.4* --  ALBUMIN 2.4* 2.4* 2.7* --  AST 24 22 26  --  ALT 22 20 23  --  ALKPHOS 126* 115 130* --  BILITOT 0.5 0.8 1.2 --  BILIDIR -- -- -- --  IBILI -- -- -- --  PREALBUMIN -- 12.3* -- --  TRIG -- 61 -- --  CHOLHDL -- -- -- --  CHOL -- 46 -- --   Estimated Creatinine Clearance: 24.9 ml/min (by C-G formula based on Cr of 2.39).    Basename 12/10/11 1715 12/10/11 1143 12/10/11 0825  GLUCAP 70 92 96    Medical History: Past Medical History  Diagnosis Date  . Short bowel syndrome   . Abnormal LFTs   . Vitamin d deficiency   . Personal history of colonic polyps   . Thrombophilia   . Diabetes mellitus    resolved after weight loss  . Obesity     prior to weight loss  . Allergic rhinosinusitis   . At risk for dental problems   . Fracture of left clavicle   . Osteoporosis   . Anemia of chronic disease   . Renal insufficiency   . Atypical nevus   . Pathologic fracture of neck of femur   . Pancytopenia 10/07/2011  . Small bowel ischemia   . Superior mesenteric artery stenosis   . Bacterial overgrowth syndrome     Medications:  Scheduled:     . B-complex with vitamin C  1 tablet Oral Daily  . diphenoxylate-atropine  1 tablet Oral TID  . enoxaparin (LOVENOX) injection  70 mg Subcutaneous Q24H  . feeding supplement  237 mL Oral Daily  . feeding supplement  1 Container Oral Q1400  . multivitamin with minerals  1 tablet Oral Daily  . pantoprazole  40 mg Oral Q1200  . warfarin  10 mg Oral ONCE-1800  . Warfarin - Pharmacist Dosing Inpatient   Does not apply q1800  . DISCONTD: insulin aspart  0-9 Units Subcutaneous Custom  Infusions:     . sodium chloride 125 mL/hr at 12/10/11 1601  . fat emulsion 240 mL (12/10/11 1802)  . TPN (CLINIMIX) +/- additives    . TPN (CLINIMIX) +/- additives 50 mL/hr at 12/11/11 0444  . DISCONTD: TPN (CLINIMIX) +/- additives      Insulin Requirements in the past 24 hours:  No SSI required.  CBGs 70-103  Nutritional Goals:  Per RD assessment 7/30: Kcal: 1600-1750 kcal, Protein: 58-72g , Fluid: 2.5 L  Goal TNA (adjusting per RD recommendations) = Clinimx E 4.25/25 - infused over 10-12 hours plus IV lipids over 10-12hr on MWF to provide 61g Protein/day, 1948 kcal/day on lipid days, 1468 on non-lipid days, for an average of 1674 kcal/day.  Current Nutrition:  Regular diet, PO intake recorded but pt not likely absorbing (see assessment below) Chronic home TNA  Assessment: 61 yo F admitted 12/08/11 with CC: low grade fever and inability to eat for several months, found to be in worsening renal failure, ultimately admitted for dehydration/renal  failure. 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 She is on chronic TNA at home (for 7 years, per pt report) infused via PICC (placed 08/13/11 at [facility] per report) Pt also has hx of abnormal LFTs, DM ("resolved after weight loss"), erosive esophagitis, anemia of chronic dz, and renal insufficiency. She is on abx frequently for bacteria overgrowth in bowel. TNA resumed 7/30 inpatient.  Pharmacist had lengthy discussion with patient on 7/30, who reports that she's been on TNA for 7 years. She reports that she's allowed to eat "whatever she wants" at home in addition to the TNA, but the food "usually passes right though her" (her daughter estimates within 30 mins). For the past couple weeks, patient reports having no appetite; however, she mentions she is hungry this morning. Spoke with a family member over the phone (granddaughter) who read her home TNA label to me, which indicates: 2100 ml total cycled over 10 hours with a 1 hour ramp up and 1 hour ramp down. TNA bag ( ) contains: 250g Dextrose, 100g Travasol 10%, 45g Intralipids 30%, MVI, TE, Zinc, Selenium, and various electrolytes. Per patient report, TNA bag also contains B-complex vitamins and folic acid, however this information was not relayed to me over the phone. Bag does not contain any insulin. This home TNA provides approx 45g Protein/day and 1675 Kcal/day. Patient is agreeable to using [place] supplied TNA and is ok if TNA is cycled over 12 hours instead of usual 10 hours.  TNA goals adjusted to RD's recommendations inpatient: kcal goal remains the same; however, RD recommends a higher protein goal.  Advancing to higher protein supplementation is limited by the patient's current renal failure.  Labs: Lytes: Hyponatremia (unable to adjust Na+ content of TNA, improving with IVF per MD). Renal: SCr elevated, may be at plateau. CrCl ~ 24ml/min.  If SCr shows trend down  tomorrow, will consider advancing TNA rate.   LFTs: wnl, Alk Phos slightly elevated CBGs: at goal <150 Prealbumin: 12.3 (7/31) GI PPx: PO Protonix IVF: NS at 125 ml/hr per MD   Plan: 1) Due to acute renal failure, will not advance TNA just yet.  Continue Clinimix E 4.25/25, cycled over 12 hours (6p-6a): 82ml/hr x1hr, 73ml/hr x 10hr, 19ml/hr x1 hour (off 6a-6p). 2) IV lipids, MVI, and TE on MWF only due to national back order. Will continue to put MVI and TE in TNA  due to short gut syndrome and questionable PO absorption (pt has active order for PO MVI).  Thiamine and Folic acid will be added to TNA daily per MD request.  3) SSI sensitive scale revolving around cyclic admin times (due to hx of DM, even though reportedly resolved).  Will reduce frequency of CBG checks if CBGs remain at goal after TNA rate advanced. 4) IVF per MD, once dehydration resolved per MD would recommend reducing IVF rate while TNA is running between 6p-6a. 5) BMET in AM. TNA labs qMon/Thurs.  Clance Boll, PharmD, BCPS Pager: [phone] 12/11/2011 8:02 AM

## 2011-12-11 NOTE — Consult Note (Signed)
[name] [name] 12/11/2011 Ladamien Rammel D Requesting Physician:  Dr. Gonzella Lex  Reason for Consult:  Acute renal failure HPI: The patient is a 61 y.o. 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  She is on IV TPN at home for SBS managed by GI. Poor po intake for several months. Has anemia requiring transfusions for about a year.  She takes coumadin ever since her bowel ischemia episode. Recently her TPN has been difficult to obtain due to a shortage of one of the compoenents. She has been getting extra fluids, 1 liter per day of saline, at home for the last 4 days for suspected dehydration. For bacterial overgrowth she takes cipro bid for one week about every two months, and this has been going on for a few years that the pateint can remember. Does not use NSAID's because of coumadin.   UA- 1.013, pH 7, 100 prot, 0-2wbc, TNTC rbc, few bact UNa- 107 CXR- no acute findings Renal US-  Right kidney 12.9 cm, Left kidney 13.6 cm. +mild increased echogenicity bilat  Creat  Date/Time Value Range Status  03/01/2009 10:29 AM 0.5* 0.6 - 1.2 mg/dl Final  1/61/0960  4:54 AM 0.7  0.6 - 1.2 mg/dl Final     Creatinine, Ser  Date/Time Value Range Status  12/11/2011  5:12 AM 2.39* 0.50 - 1.10 mg/dL Final  0/98/1191  4:78 AM 2.42* 0.50 - 1.10 mg/dL Final  2/95/6213  0:86 PM 2.38* 0.50 - 1.10 mg/dL Final  5/78/4696  2:95 PM 2.27* 0.50 - 1.10 mg/dL Final  2/84/1324  4:01 PM 1.13* 0.50 - 1.10 mg/dL Final  0/27/2536 64:40 AM 1.1  0.4 - 1.2 mg/dL Final  3/47/4259  5:63 PM 0.75  0.50 - 1.10 mg/dL Final  87/09/6431  2:95 PM 0.6  0.4 - 1.2 mg/dL Final  18/84/1660 6.30   Final  04/22/2010 0.48   Final  04/12/2010 10:24 AM 0.4  0.4-1.2 mg/dL Final  16/05/930  3.55   Final  10/01/2009 11:35 AM 0.5  0.4-1.2 mg/dL Final  7/32/2025  4:27 AM 0.5  0.4-1.2 mg/dL Final  10/11/3760  8:31 AM 0.45   Final  03/19/2007  5:00 AM 0.48   Final  03/17/2007  4:00 AM 0.50   Final  03/16/2007  5:20 AM 0.42   Final  03/15/2007 11:39 AM 0.45   Final  01/15/2007  9:59 AM 0.50  0.40 - 1.20 mg/dL Final  09/10/7614 07:37 AM 0.46  0.40 - 1.20 mg/dL Final  05/17/2692  8:54 PM 0.49  0.40 - 1.20 mg/dL Final  10/11/7033  0:09 PM 0.50  0.40 - 1.20 mg/dL Final  3/81/8299 37:16 AM 0.5  0.4 - 1.2 mg/dL Final    Past Medical History:  Past Medical History  Diagnosis Date  . Short bowel syndrome   . Abnormal LFTs   . Vitamin d deficiency   . Personal history of colonic polyps   . Thrombophilia   . Diabetes mellitus     resolved after weight loss  . Obesity     prior to weight loss  . Allergic rhinosinusitis   . At risk for dental problems   . Fracture of left clavicle   . Osteoporosis   . Anemia of chronic disease   . Renal  insufficiency   . Atypical nevus   . Pathologic fracture of neck of femur   . Pancytopenia 10/07/2011  . Small bowel ischemia   . Superior mesenteric artery stenosis   . Bacterial overgrowth syndrome     Past Surgical History:  Past Surgical History  Procedure Date  . Small intestine surgery 2005    multiple with right colon resection for ischemia/infarct  . Cholecystectomy   . Orif proximal femoral fracture w/ itst nail system 03/2007    left, Dr. Simonne Come  . Esophagogastroduodenoscopy 01/22/2009    erosive esophagitis  . Colonoscopy 12/05/2005    internal hemorrhoids (for polyp surveillance)    Family History:  Family History  Problem Relation Age of Onset  . Diabetes Mother   . Hypertension Mother   . Colon cancer Neg Hx    Social History:  reports that she has been smoking Cigarettes.  She does not have any smokeless tobacco history on file. She reports that she does not drink alcohol or use illicit drugs.  Allergies:  Allergies    Allergen Reactions  . Iohexol Itching    IVP Dye ok if taken with benadryl  . Penicillins Itching    Home medications: Prior to Admission medications   Medication Sig Start Date End Date Taking? Authorizing Provider  ADULT TPN Inject 2,100 mLs into the vein continuous. Infuse over 10 hours with 1 hour ramp up and 1 hour ramp down. Total bag per day provides: 250g Dextrose 70%, 100g Travasol 10%, 45g Intralipids 30%, various electrolytes, MVI, TE, Zinc, selenium.   Yes Historical Provider, MD  calcium carbonate (TUMS EX) 750 MG chewable tablet Chew 2 tablets by mouth 3 (three) times daily. Calcium   Yes Historical Provider, MD  ciprofloxacin (CIPRO) 500 MG tablet TAKE 1 TABLET BY MOUTH TWICE DAILY FOR 1 WEEK AT A TIME INTERMITTENTLY AS DIRECTED FOR BACTERIAL OVERGROWTH 11/10/11  Yes Iva Boop, MD  diphenhydrAMINE (BENADRYL) 25 mg capsule Take 25 mg by mouth every 6 (six) hours as needed. For itching/sleep.   Yes Historical Provider, MD  Eflornithine HCl (VANIQA) 13.9 % cream Apply 1 application topically 2 (two) times daily with a meal. Applied to facial hair.   Yes Historical Provider, MD  Multiple Vitamin (MULTIVITAMIN WITH MINERALS) TABS Take 1 tablet by mouth daily.   Yes Historical Provider, MD  omeprazole (PRILOSEC) 40 MG capsule Take 40 mg by mouth daily. 11/20/10  Yes Iva Boop, MD  Parenteral Electrolytes (TPN ELECTROLYTES IV) Inject 1 each into the vein at bedtime.    Yes Historical Provider, MD  promethazine (PHENERGAN) 25 MG tablet Take 25 mg by mouth every 6 (six) hours as needed. For nausea.   Yes Historical Provider, MD  ursodiol (ACTIGALL) 300 MG capsule TAKE 1 CAPSULE BY MOUTH TWICE DAILY 07/11/11  Yes Iva Boop, MD  Vitamin D, Ergocalciferol, (DRISDOL) 50000 UNITS CAPS ALTERNATE TAKING 1 CAPSULE BY MOUTH EVERY OTHER DAY AND 2 CAPSULES BY MOUTH EVERY OTHER DAY 10/15/11  Yes Iva Boop, MD  warfarin (COUMADIN) 10 MG tablet Take 1 tablet (10 mg total) by mouth  daily. 09/18/11 09/17/12 Yes Victorino December, MD    Inpatient medications:    . B-complex with vitamin C  1 tablet Oral Daily  . diphenoxylate-atropine  1 tablet Oral TID  . feeding supplement  237 mL Oral Daily  . feeding supplement  1 Container Oral Q1400  . multivitamin with minerals  1 tablet Oral Daily  . pantoprazole  40 mg Oral Q1200  . warfarin  1 mg Oral ONCE-1800  . warfarin  10 mg Oral ONCE-1800  . Warfarin - Pharmacist Dosing Inpatient   Does not apply q1800  . DISCONTD: enoxaparin (LOVENOX) injection  70 mg Subcutaneous Q24H    Review of Systems Gen:  Denies headache, fever, chills, sweats.  No weight loss. HEENT:  No visual change, sore throat, difficulty swallowing. Resp:  No difficulty breathing, DOE.  No cough or hemoptysis. Cardiac:  No chest pain, orthopnea, PND.  Denies edema. GI:   Denies abdominal pain. Vomiting better. No abd pain now either.  No constipation. GU:  See above. MS:  Denies joint pain or swelling.   Derm:  Denies skin rash or itching.  No chronic skin conditions.  Neuro:   Denies focal weakness, memory problems, hx stroke or TIA.   Psych:  Denies symptoms of depression of anxiety.  No hallucination.    Labs: Basic Metabolic Panel:  Lab 12/11/11 0981 12/10/11 0645 12/09/11 1730 12/08/11 1757  NA 131* 130* 128* 122*  K 3.6 4.1 3.9 4.3  CL 108 105 100 94*  CO2 14* 17* 18* 17*  GLUCOSE 115* 131* 64* 77  BUN 32* 40* 37* 43*  CREATININE 2.39* 2.42* 2.38* 2.27*  ALB -- -- -- --  CALCIUM 7.2* 7.6* 7.7* 8.1*  PHOS 4.1 4.7* -- 2.7   Liver Function Tests:  Lab 12/11/11 0512 12/10/11 0645 12/09/11 1730  AST 24 22 26   ALT 22 20 23   ALKPHOS 126* 115 130*  BILITOT 0.5 0.8 1.2  PROT 7.7 7.5 8.4*  ALBUMIN 2.4* 2.4* 2.7*   No results found for this basename: LIPASE:3,AMYLASE:3 in the last 168 hours No results found for this basename: AMMONIA:3 in the last 168 hours CBC:  Lab 12/11/11 0512 12/10/11 0645 12/09/11 1730 12/08/11 1757  WBC 3.2*  3.6* 3.9* 3.8*  NEUTROABS -- 2.0 -- 2.4  HGB 7.5* 7.7* 8.0* 7.2*  HCT 22.7* 22.9* 24.2* 21.8*  MCV 82.8 82.4 81.5 80.7  PLT 75* 84* 77* 70*   PT/INR: @labrcntip (inr:5) Cardiac Enzymes: No results found for this basename: CKTOTAL:5,CKMB:5,CKMBINDEX:5,TROPONINI:5 in the last 168 hours CBG:  Lab 12/11/11 1152 12/11/11 0444 12/10/11 1715 12/10/11 1143 12/10/11 0825  GLUCAP 71 128* 70 92 96    Physical Exam:  Blood pressure 101/59, pulse 76, temperature 99.7 F (37.6 C), temperature source Oral, resp. rate 16, height 5\' 8"  (1.727 m), weight 73.2 kg (161 lb 6 oz), SpO2 99.00%.  Gen: alert, thin adult female, no distress Skin: no rash, cyanosis HEENT:  EOMI, sclera anicteric, throat slightly dry Neck: no JVD, loud deep R carotid bruit (vs transmitted murmur), no LAN Chest: clear bilat, good air movement Heart: regular, no rub or gallop, +2/6 SEM LUSB Abdomen: soft, nontender, no HSM, no ascites, healed scars from abd surgery Ext: no LE edema or UE edema Neuro: alert, Ox3, no focal deficit  UA- 1.013, pH 7, 100 prot, 0-2wbc, TNTC rbc, few bact UNa- 107 CXR- no acute findings Renal US-  Right kidney 12.9 cm, Left kidney 13.6 cm. +mild increased echogenicity bilat  Impression: 1. Renal failure, acute and/or chronic.  Baseline creat was 0.5, but had increased to 1.1 from Dec 2012 to  May 2013. Now creat 2.5, ECHO shows slightly echogenic kidneys of normal size.  I would estimate there is probably still a volume component due to SBS and chronic GI losses, and will increase fluids for this. 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  2. 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 Chronic anemia- unclear cause 4. Hematuria- repeat UA 5. Carotid bruit- check dopplers  Recommend: urine eos, repeat UA, carotid doppler,  increase IVF"s, daily creat  Will follow.    Vinson Moselle  MD [facility] [phone] pgr    [phone] cell 12/11/2011, 4:35 PM

## 2011-12-11 NOTE — Consult Note (Signed)
ONCOLOGY  HOSPITAL CONSULTATION NOTE  [name] [name]                                MR#: [id number]  DOB: [birth year]                        CSN#: [id number] Referring MD: Pancytopenia   Reason for Consult: Pancytopenia  Patient Identification: [name] with thrombosis with small bowel ischemia and proximal superior mesentery artery stenosis. Patient also has anemia of chronic disease and has been receiving Aranesp. 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   CURRENT THERAPY:  1. Coumadin to try to maintain an INR between 2.5 to 3.0. 2. Aranesp every 4 weeks to keep hematocrit at or above 36%.  HPI: Patient was last seen at the office of Dr. Welton Flakes by 28 2013 at which time, she was developing worsening pancytopenia, with a white count of 3.5, hemoglobin 7.6 at the lowest. Her platelets were low as well as 94,000. She was symptomatic although no bleeding problems were noted. At the time, she was transfused 2 units of packed red blood with good response. She received 2 more units on 11/15/2011. On 12/01/2011 he was evaluated by Dr. Leone Payor from [facility]. Her Hemoccult was negative. Her last EGD and colonoscopy last year was negative. Symptoms progressed further, requiring hospitalization on 12/08/2011, at which time her hemoglobin was 7.2, and 21.8 requiring 2 more units of blood. On 7/30, her hemoglobin and hematocrit went 8.0 and 24.2 respectively. Then a gain on 7/31 it dropped to 7.7 and 22.9 and today is 7.5 and 22.7 respectively . In addition, her white count was 3.9, and today is 3.2. Her platelets are on admission 77,000 dropping to 75,000 today Possible bone marrow biopsy and aspirate to further investigate the pancytopenia was entertained in the recent past. We were requested to see the patient, with recommendations from the hematologic standpoint, with possible bone marrow biopsy while.  in the hospital.  On admission, the patient had decreased energy, dyspnea on  exertion and poor by mouth intake. He had intermittent fevers reaching 101 aspirin outpatient, as well as lower abdominal discomfort. She had noticed a small amount of blood in her urine prior to admission of note, the patient is treated with you have weekly with Cipro, for bacteria overgrowth, but no antibiotic has been taking for the last 6 weeks. Of note, blood cultures to date are negative. Today, after receiving resolution, she reports feeling better, although still not able to increase her mobility and stamina.   PMH:  Past Medical History  Diagnosis Date  . Short bowel syndrome   . Abnormal LFTs   . Vitamin d deficiency   . Personal history of colonic polyps   . Thrombophilia   . Diabetes mellitus     resolved after weight loss  . Obesity     prior to weight loss  . Allergic rhinosinusitis   . At risk for dental problems   . Fracture of left clavicle   . Osteoporosis   . Anemia of chronic disease   . Renal insufficiency   . Atypical nevus   . Pathologic fracture of neck of femur   . Pancytopenia 10/07/2011  . Small bowel ischemia   . Superior mesenteric artery stenosis   . Bacterial overgrowth syndrome     Surgeries:  Past Surgical  History  Procedure Date  . Small intestine surgery 2005    multiple with right colon resection for ischemia/infarct  . Cholecystectomy   . Orif proximal femoral fracture w/ itst nail system 03/2007    left, Dr. Simonne Come  . Esophagogastroduodenoscopy 01/22/2009    erosive esophagitis  . Colonoscopy 12/05/2005    internal hemorrhoids (for polyp surveillance)    Allergies:  Allergies  Allergen Reactions  . Iohexol Itching    IVP Dye ok if taken with benadryl  . Penicillins Itching    Medications:   Prior to Admission:  Prescriptions prior to admission  Medication Sig Dispense Refill  . ADULT TPN Inject 2,100 mLs into the vein continuous. Infuse over 10 hours with 1 hour ramp up and 1 hour ramp down. Total bag per day  provides: 250g Dextrose 70%, 100g Travasol 10%, 45g Intralipids 30%, various electrolytes, MVI, TE, Zinc, selenium.      . calcium carbonate (TUMS EX) 750 MG chewable tablet Chew 2 tablets by mouth 3 (three) times daily. Calcium      . ciprofloxacin (CIPRO) 500 MG tablet TAKE 1 TABLET BY MOUTH TWICE DAILY FOR 1 WEEK AT A TIME INTERMITTENTLY AS DIRECTED FOR BACTERIAL OVERGROWTH  28 tablet  4  . diphenhydrAMINE (BENADRYL) 25 mg capsule Take 25 mg by mouth every 6 (six) hours as needed. For itching/sleep.      . Eflornithine HCl (VANIQA) 13.9 % cream Apply 1 application topically 2 (two) times daily with a meal. Applied to facial hair.      . Multiple Vitamin (MULTIVITAMIN WITH MINERALS) TABS Take 1 tablet by mouth daily.      Marland Kitchen omeprazole (PRILOSEC) 40 MG capsule Take 40 mg by mouth daily.      . Parenteral Electrolytes (TPN ELECTROLYTES IV) Inject 1 each into the vein at bedtime.       . promethazine (PHENERGAN) 25 MG tablet Take 25 mg by mouth every 6 (six) hours as needed. For nausea.      . ursodiol (ACTIGALL) 300 MG capsule TAKE 1 CAPSULE BY MOUTH TWICE DAILY  60 capsule  5  . Vitamin D, Ergocalciferol, (DRISDOL) 50000 UNITS CAPS ALTERNATE TAKING 1 CAPSULE BY MOUTH EVERY OTHER DAY AND 2 CAPSULES BY MOUTH EVERY OTHER DAY  45 capsule  1  . warfarin (COUMADIN) 10 MG tablet Take 1 tablet (10 mg total) by mouth daily.  60 tablet  12    WUJ:[id number], diphenhydrAMINE, morphine injection, ondansetron (ZOFRAN) IV, ondansetron, sodium chloride  ROS: See history of present illness for significant positives, the rest of the review of systems is negative  Family History:    Family History  Problem Relation Age of Onset  . Diabetes Mother   . Hypertension Mother   . Colon cancer Neg Hx     No family history of bleeding disorders.  Social History:  reports that she has been smoking Cigarettes.  She does not have any smokeless tobacco history on file. She reports that she does not drink alcohol  or use illicit drugs.  Physical Exam    Filed Vitals:   12/11/11 1400  BP: 101/59  Pulse: 76  Temp: 99.7 F (37.6 C)  Resp: 16      General:  61 -year-old wf in no acute distress A. and O. x3  well-developed and chronically ill appearing HEENT: Normocephalic, atraumatic, PERRLA. Oral cavity without thrush or lesions. Poor dentition Neck supple. no thyromegaly, no cervical or supraclavicular adenopathy  Lungs clear bilaterally . No  wheezing, a few rhonchi on the right lung, no  rales. No axillary masses. Breasts: not examined. Cardiac regular rate and rhythm normal S1-S2, no murmur , rubs or gallops Abdomen soft nontender , bowel sounds x4. No HSM. No masses palpable.  GU/rectal: deferred. Extremities no clubbing cyanosis or edema. No bruising or petechial rash Musculoskeletal: no spinal tenderness.  Neuro: Non Focal  Labs:  CBC   Lab 12/11/11 0512 12/10/11 0645 12/09/11 1730 12/08/11 1757  WBC 3.2* 3.6* 3.9* 3.8*  HGB 7.5* 7.7* 8.0* 7.2*  HCT 22.7* 22.9* 24.2* 21.8*  PLT 75* 84* 77* 70*  MCV 82.8 82.4 81.5 80.7  MCH 27.4 27.7 26.9 26.7  MCHC 33.0 33.6 33.1 33.0  RDW 16.6* 16.3* 16.2* 16.3*  LYMPHSABS -- 0.9 -- 0.9  MONOABS -- 0.6 -- 0.4  EOSABS -- 0.1 -- 0.1  BASOSABS -- 0.0 -- 0.0  BANDABS -- -- -- --     CMP    Lab 12/11/11 0512 12/10/11 0645 12/09/11 1730 12/08/11 1757  NA 131* 130* 128* 122*  K 3.6 4.1 3.9 4.3  CL 108 105 100 94*  CO2 14* 17* 18* 17*  GLUCOSE 115* 131* 64* 77  BUN 32* 40* 37* 43*  CREATININE 2.39* 2.42* 2.38* 2.27*  CALCIUM 7.2* 7.6* 7.7* 8.1*  MG 1.8 2.1 -- 1.7  AST 24 22 26 26   ALT 22 20 23 24   ALKPHOS 126* 115 130* 140*  BILITOT 0.5 0.8 1.2 0.6        Component Value Date/Time   BILITOT 0.5 12/11/2011 0512   BILITOT 0.40 03/01/2009 1029      Lab 12/11/11 0512 12/10/11 1000 12/09/11 1730 12/08/11 1757  INR 2.56* 1.93* 1.82* 4.33*  PROTIME -- -- -- --    No results found for this basename: DDIMER:2 in the last 72  hours   Anemia panel:  No results found for this basename: VITAMINB12:2,FOLATE:2,FERRITIN:2,TIBC:2,IRON:2,RETICCTPCT:2 in the last 72 hours   Imaging Studies:  Dg Chest 2 View  12/09/2011  *RADIOLOGY REPORT*  Clinical Data: Fever and weakness.  Ex-smoker.  CHEST - 2 VIEW  Comparison: CT of 10/24/2011 and plain film of 01/17/2011.  Findings: A left-sided PICC line which is unchanged and terminates at the low SVC. Midline trachea.  Borderline cardiomegaly.  Age advanced aortic atherosclerosis. No pleural effusion or pneumothorax.  The reticular nodular opacities described on the prior plain film are improved to resolved.  Mild interstitial prominence remains, and is likely related to the clinical history of  prior smoking. No lobar consolidation.  IMPRESSION: Interstitial thickening, likely related to prior smoking / chronic bronchitis.  No evidence of acute superimposed pneumonia.  Original Report Authenticated By: Consuello Bossier, M.D.   US Renal  12/09/2011  *RADIOLOGY REPORT*  Clinical Data: Hematuria.  History of diabetes.  RENAL/URINARY TRACT ULTRASOUND COMPLETE  Comparison:  Abdominal ultrasound 10/29/2011.  Findings:  Right Kidney:  Mild diffuse increased echogenicity of the renal parenchyma is nonspecific.  No focal cystic or solid renal lesions. 12.9 cm in length.  No hydronephrosis. No definite calculi.  Left Kidney:  Mild diffuse increased echogenicity of the renal parenchyma is nonspecific.  No focal cystic or solid renal lesions. 13.6 cm in length.  No hydronephrosis. No definite calculi.  Bladder:  Well distended without focal wall abnormalities.  IMPRESSION: 1.  Mild diffusely increased echogenicity of the the renal parenchyma bilaterally is nonspecific, but could suggest underlying medical renal disease. 2.  No focal cystic or solid renal lesions, and no  definite evidence of calculi within the collecting systems.  Original Report Authenticated By: Florencia Reasons, M.D.      A/P:  61 y.o. female  [name] with thrombosis with small bowel ischemia and proximal superior mesentery artery stenosis. Patient also has anemia of chronic disease and has been receiving Aranesp. 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 The patient presented with symptomatic anemia as well as fever as an outpatient. She was found to have a hemoglobin and hematocrit of 7.2 and 21.8 requiring 2 units of blood with good response. She also has a white count of 3.2, and lower platelet count. You were asked to see the patient for evaluation of underlying in hematologic disorder requiring bone marrow biopsy while hospitalized. Patient has ARI to be evaluated by Renal service.   Other medical issues are being addressed by multispecialties Dr. Welton Flakes  is to see the patient following this consult with recommendations regarding diagnosis, and further workup studies.  Thank you for the referral.  [facility] E 12/11/2011 4:11 PM  We will plan to do a bone marrow as outpatient if needed.  Drue Second

## 2011-12-11 NOTE — Progress Notes (Signed)
Hypoglycemic Event  CBG: 65  Treatment: 15 GM carbohydrate snack  Symptoms: None  Follow-up CBG: Time:1826 CBG Result:77  Possible Reasons for Event: Inadequate meal intake  Comments/MD notified:poor intake, TPN    Curley Spice  Remember to initiate Hypoglycemia Order Set & complete

## 2011-12-11 NOTE — Progress Notes (Signed)
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 Iohexol Itching    IVP Dye ok if taken with benadryl  . Penicillins Itching   Patient Measurements: Height: 5\' 8"  (172.7 cm) Weight: 161 lb 6 oz (73.2 kg) IBW/kg (Calculated) : 63.9   Vital Signs: Temp: 98.3 F (36.8 C) (08/01 0446) Temp src: Oral (08/01 0446) BP: 111/66 mmHg (08/01 0446) Pulse Rate: 64  (08/01 0446)  Labs:  Basename 12/11/11 0512 12/10/11 1000 12/10/11 0645 12/09/11 1730  HGB 7.5* -- 7.7* --  HCT 22.7* -- 22.9* 24.2*  PLT 75* -- 84* 77*  APTT -- -- -- --  LABPROT 27.9* 22.4* -- 21.4*  INR 2.56* 1.93* -- 1.82*  HEPARINUNFRC -- -- -- --  CREATININE 2.39* -- 2.42* 2.38*  CKTOTAL -- -- -- --  CKMB -- -- -- --  TROPONINI -- -- -- --   Estimated Creatinine Clearance: 24.9 ml/min (by C-G formula based on Cr of 2.39).  Medications:  Prescriptions prior to admission  Medication Sig Dispense Refill  . ADULT TPN Inject 2,100 mLs into the vein continuous. Infuse over 10 hours with 1 hour ramp up and 1 hour ramp down. Total bag per day provides: 250g Dextrose 70%, 100g Travasol 10%, 45g Intralipids 30%, various electrolytes, MVI, TE, Zinc, selenium.      . calcium carbonate (TUMS EX) 750 MG chewable tablet Chew 2 tablets by mouth 3 (three) times daily. Calcium      . ciprofloxacin (CIPRO) 500 MG tablet TAKE 1 TABLET BY MOUTH TWICE DAILY FOR 1 WEEK AT A TIME INTERMITTENTLY AS DIRECTED FOR BACTERIAL OVERGROWTH  28 tablet  4  . diphenhydrAMINE (BENADRYL) 25 mg capsule Take 25 mg by mouth every 6 (six) hours as needed. For itching/sleep.      . Eflornithine HCl (VANIQA) 13.9 % cream Apply 1 application topically 2 (two) times daily with a meal. Applied to facial hair.      . Multiple Vitamin (MULTIVITAMIN WITH MINERALS) TABS Take 1 tablet by mouth daily.      Marland Kitchen  omeprazole (PRILOSEC) 40 MG capsule Take 40 mg by mouth daily.      . Parenteral Electrolytes (TPN ELECTROLYTES IV) Inject 1 each into the vein at bedtime.       . promethazine (PHENERGAN) 25 MG tablet Take 25 mg by mouth every 6 (six) hours as needed. For nausea.      . ursodiol (ACTIGALL) 300 MG capsule TAKE 1 CAPSULE BY MOUTH TWICE DAILY  60 capsule  5  . Vitamin D, Ergocalciferol, (DRISDOL) 50000 UNITS CAPS ALTERNATE TAKING 1 CAPSULE BY MOUTH EVERY OTHER DAY AND 2 CAPSULES BY MOUTH EVERY OTHER DAY  45 capsule  1  . warfarin (COUMADIN) 10 MG tablet Take 1 tablet (10 mg total) by mouth daily.  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 Welton Flakes.  Such a narrow INR goal may be difficult in this patient with low gut absorption (therefore, low Vitamin K absorption).  Home dose reported as 10 mg daily.   INR >4 on admission. Warfarin restarted 7/30 after INR <2 after transfusions. Lovenox bridging until INR is in therapeutic range.  INR therapeutic today but increased significantly following 10 mg doses.  Home 10 mg dose appears to high.  SCr elevated, CrCl ~68ml/min.  Platelets appear chronically low, monitoring closely, currently stable.  Goal of Therapy:  INR 2.5-3. Monitor platelets daily.   Plan:   Considering significant increase in INR overnight, will only provide 1 mg of warfarin tonight.  Discontinue Lovenox with INR>2.5.  F/u CBC and INR.  Clance Boll, PharmD, BCPS Pager: [phone] 12/11/2011 8:17 AM

## 2011-12-11 NOTE — Progress Notes (Addendum)
[facility] PROGRESS NOTE  [name] [name] ZOX:[id number] DOB: 09/17/1950 DOA: 12/08/2011 PCP: [name], MD  Assessment/Plan:  Anemia  -Patient has history of chronic anemia secondary to chronic disease, she follows with Dr. Welton Flakes  -Patient hemoglobin usually between 8.5-9, she came in with hemoglobin of 7.2.  -Status post transfusion of 2 units of packed RBCs, post-transfusion CBC improved  -She had recent 5 negative cards for FOBT on 11/03/2011. She has negative EGD/colonoscopy last year.  -appreciate GI eval. i discussed with her hematologist Dr Welton Flakes who feels her chronic pancytopenia is due to chronic underlying illness and given her anemia at baseline would not need bone marrow bx as inpatient and will plan as outpatient  -gets monthly arenesp injection.  Short gut syndrome  -Patient is on total parenteral nutrition secondary to short gut (reported 1 foot left of her small bowel).  -Patient mentioned of missing out on nutrients and MVs Due to shortage in the injectable multivitamins.  -Continue TPN, multivitamins per pharmacy.    Acute renal failure  initially thought  likely secondary to dehydration. Renal USG unremarkable  -renal function not improved with IV hydration for past few days -as per GI could possibly be associated with TPN as well.  Discussed with Dr Arlean Hopping who will see pt today -low bicarb also noted. Will need fluid adjusted on TPN vs IV bicarb. Will discuss with renal  Hyponatremia  - likely secondary to dehydration and acute renal failure.  -slowly improving with fluids . Monitor in am    Diarrhea : chronic  improved with lomotil  Hematuria  -Patient has painless hematuria occasinally but stable  DVT prophylaxis  On coumadin   Code Status:full   Family Communication: daughter at bedside   Disposition Plan: home once stable   Consultants:  [facility]   Procedures:  None   Antibiotics:  None   HPI/Subjective:  Patient seen  and examined this am. Denies any symptoms. Diarrhea improved with lomotil.   Objective: Filed Vitals:   12/10/11 1428 12/10/11 2055 12/11/11 0446 12/11/11 1400  BP: 123/73 124/75 111/66 101/59  Pulse: 66 63 64 76  Temp: 97.7 F (36.5 C) 98.6 F (37 C) 98.3 F (36.8 C) 99.7 F (37.6 C)  TempSrc: Oral Oral Oral Oral  Resp: 16 16 16 16   Height:      Weight:   73.2 kg (161 lb 6 oz)   SpO2: 100% 100% 99% 99%    Intake/Output Summary (Last 24 hours) at 12/11/11 1522 Last data filed at 12/11/11 1100  Gross per 24 hour  Intake 3749.67 ml  Output      0 ml  Net 3749.67 ml    Exam:  General: Elderly female in NAD  HEENT: no pallor, dry oral mucosa  Cardiovascular: N S1&S2, no murmurs  Respiratory: clear b/l no added sounds  Abdomen: old laprotomy scar , soft, NT, ND, BS+  CNS: AAO X 3 , non focal  Data Reviewed: Basic Metabolic Panel:  Lab 12/11/11 8119 12/10/11 0645 12/09/11 1730 12/08/11 1757  NA 131* 130* 128* 122*  K 3.6 4.1 3.9 4.3  CL 108 105 100 94*  CO2 14* 17* 18* 17*  GLUCOSE 115* 131* 64* 77  BUN 32* 40* 37* 43*  CREATININE 2.39* 2.42* 2.38* 2.27*  CALCIUM 7.2* 7.6* 7.7* 8.1*  MG 1.8 2.1 -- 1.7  PHOS 4.1 4.7* -- 2.7   Liver Function Tests:  Lab 12/11/11 0512 12/10/11 0645 12/09/11 1730 12/08/11 1757  AST 24 22  26 26  ALT 22 20 23 24   ALKPHOS 126* 115 130* 140*  BILITOT 0.5 0.8 1.2 0.6  PROT 7.7 7.5 8.4* 8.1  ALBUMIN 2.4* 2.4* 2.7* 2.6*   No results found for this basename: LIPASE:5,AMYLASE:5 in the last 168 hours No results found for this basename: AMMONIA:5 in the last 168 hours CBC:  Lab 12/11/11 0512 12/10/11 0645 12/09/11 1730 12/08/11 1757  WBC 3.2* 3.6* 3.9* 3.8*  NEUTROABS -- 2.0 -- 2.4  HGB 7.5* 7.7* 8.0* 7.2*  HCT 22.7* 22.9* 24.2* 21.8*  MCV 82.8 82.4 81.5 80.7  PLT 75* 84* 77* 70*   Cardiac Enzymes: No results found for this basename: CKTOTAL:5,CKMB:5,CKMBINDEX:5,TROPONINI:5 in the last 168 hours BNP (last 3 results) No results  found for this basename: PROBNP:3 in the last 8760 hours CBG:  Lab 12/11/11 1152 12/11/11 0444 12/10/11 1715 12/10/11 1143 12/10/11 0825  GLUCAP 71 128* 70 92 96    Recent Results (from the past 240 hour(s))  CULTURE, BLOOD (ROUTINE X 2)     Status: Normal (Preliminary result)   Collection Time   12/09/11  2:15 PM      Component Value Range Status Comment   Specimen Description BLOOD RIGHT ARM   Final    Special Requests BOTTLES DRAWN AEROBIC AND ANAEROBIC 5CC   Final    Culture  Setup Time 12/10/2011 00:49   Final    Culture     Final    Value:        BLOOD CULTURE RECEIVED NO GROWTH TO DATE CULTURE WILL BE HELD FOR 5 DAYS BEFORE ISSUING A FINAL NEGATIVE REPORT   Report Status PENDING   Incomplete   CULTURE, BLOOD (ROUTINE X 2)     Status: Normal (Preliminary result)   Collection Time   12/09/11  2:25 PM      Component Value Range Status Comment   Specimen Description Blood   Final    Special Requests Normal   Final    Culture  Setup Time 12/10/2011 00:25   Final    Culture     Final    Value:        BLOOD CULTURE RECEIVED NO GROWTH TO DATE CULTURE WILL BE HELD FOR 5 DAYS BEFORE ISSUING A FINAL NEGATIVE REPORT   Report Status PENDING   Incomplete      Studies: Dg Chest 2 View  12/09/2011  *RADIOLOGY REPORT*  Clinical Data: Fever and weakness.  Ex-smoker.  CHEST - 2 VIEW  Comparison: CT of 10/24/2011 and plain film of 01/17/2011.  Findings: A left-sided PICC line which is unchanged and terminates at the low SVC. Midline trachea.  Borderline cardiomegaly.  Age advanced aortic atherosclerosis. No pleural effusion or pneumothorax.  The reticular nodular opacities described on the prior plain film are improved to resolved.  Mild interstitial prominence remains, and is likely related to the clinical history of  prior smoking. No lobar consolidation.  IMPRESSION: Interstitial thickening, likely related to prior smoking / chronic bronchitis.  No evidence of acute superimposed pneumonia.   Original Report Authenticated By: Consuello Bossier, M.D.   US Renal  12/09/2011  *RADIOLOGY REPORT*  Clinical Data: Hematuria.  History of diabetes.  RENAL/URINARY TRACT ULTRASOUND COMPLETE  Comparison:  Abdominal ultrasound 10/29/2011.  Findings:  Right Kidney:  Mild diffuse increased echogenicity of the renal parenchyma is nonspecific.  No focal cystic or solid renal lesions. 12.9 cm in length.  No hydronephrosis. No definite calculi.  Left Kidney:  Mild diffuse increased echogenicity  of the renal parenchyma is nonspecific.  No focal cystic or solid renal lesions. 13.6 cm in length.  No hydronephrosis. No definite calculi.  Bladder:  Well distended without focal wall abnormalities.  IMPRESSION: 1.  Mild diffusely increased echogenicity of the the renal parenchyma bilaterally is nonspecific, but could suggest underlying medical renal disease. 2.  No focal cystic or solid renal lesions, and no definite evidence of calculi within the collecting systems.  Original Report Authenticated By: Florencia Reasons, M.D.    Scheduled Meds:   . B-complex with vitamin C  1 tablet Oral Daily  . diphenoxylate-atropine  1 tablet Oral TID  . feeding supplement  237 mL Oral Daily  . feeding supplement  1 Container Oral Q1400  . multivitamin with minerals  1 tablet Oral Daily  . pantoprazole  40 mg Oral Q1200  . warfarin  1 mg Oral ONCE-1800  . warfarin  10 mg Oral ONCE-1800  . Warfarin - Pharmacist Dosing Inpatient   Does not apply q1800  . DISCONTD: enoxaparin (LOVENOX) injection  70 mg Subcutaneous Q24H   Continuous Infusions:   . sodium chloride 125 mL/hr at 12/11/11 1033  . fat emulsion 240 mL (12/10/11 1802)  . TPN (CLINIMIX) +/- additives    . TPN (CLINIMIX) +/- additives 50 mL/hr at 12/11/11 0444  . TPN (CLINIMIX) +/- additives        Time spent: 30 minutes    Kody Brandl  [facility] Pager [phone]. If 8PM-8AM, please contact night-coverage at [email or web address], password  [facility] 12/11/2011, 3:22 PM  LOS: 3 days

## 2011-12-12 DIAGNOSIS — R0989 Other specified symptoms and signs involving the circulatory and respiratory systems: Secondary | ICD-10-CM

## 2011-12-12 DIAGNOSIS — R7881 Bacteremia: Secondary | ICD-10-CM

## 2011-12-12 DIAGNOSIS — I059 Rheumatic mitral valve disease, unspecified: Secondary | ICD-10-CM

## 2011-12-12 DIAGNOSIS — B379 Candidiasis, unspecified: Secondary | ICD-10-CM

## 2011-12-12 DIAGNOSIS — B961 Klebsiella pneumoniae [K. pneumoniae] as the cause of diseases classified elsewhere: Secondary | ICD-10-CM

## 2011-12-12 DIAGNOSIS — B3789 Other sites of candidiasis: Secondary | ICD-10-CM

## 2011-12-12 HISTORY — DX: Candidiasis, unspecified: B37.9

## 2011-12-12 HISTORY — DX: Bacteremia: R78.81

## 2011-12-12 HISTORY — DX: Klebsiella pneumoniae (k. pneumoniae) as the cause of diseases classified elsewhere: B96.1

## 2011-12-12 LAB — GLUCOSE, CAPILLARY
Glucose-Capillary: 97 mg/dL (ref 70–99)
Glucose-Capillary: 73 mg/dL (ref 70–99)
Glucose-Capillary: 55 mg/dL — ABNORMAL LOW (ref 70–99)
Glucose-Capillary: 60 mg/dL — ABNORMAL LOW (ref 70–99)
Glucose-Capillary: 66 mg/dL — ABNORMAL LOW (ref 70–99)

## 2011-12-12 LAB — BASIC METABOLIC PANEL
CO2: 11 mEq/L — ABNORMAL LOW (ref 19–32)
Calcium: 6.8 mg/dL — ABNORMAL LOW (ref 8.4–10.5)
Creatinine, Ser: 2.49 mg/dL — ABNORMAL HIGH (ref 0.50–1.10)
GFR calc Af Amer: 23 mL/min — ABNORMAL LOW (ref >90)
GFR calc non Af Amer: 20 mL/min — ABNORMAL LOW (ref >90)
Sodium: 131 mEq/L — ABNORMAL LOW (ref 135–145)

## 2011-12-12 LAB — PREPARE RBC (CROSSMATCH)

## 2011-12-12 LAB — PROTIME-INR
INR: 2.24 — ABNORMAL HIGH (ref 0.00–1.49)
Prothrombin Time: 25.2 seconds — ABNORMAL HIGH (ref 11.6–15.2)

## 2011-12-12 LAB — PROTEIN / CREATININE RATIO, URINE: Creatinine, Urine: 25.86 mg/dL

## 2011-12-12 LAB — CBC
HCT: 19.7 % — ABNORMAL LOW (ref 36.0–46.0)
MCHC: 33 g/dL (ref 30.0–36.0)
Platelets: 68 10*3/uL — ABNORMAL LOW (ref 150–400)
RDW: 16.6 % — ABNORMAL HIGH (ref 11.5–15.5)
WBC: 3.6 10*3/uL — ABNORMAL LOW (ref 4.0–10.5)

## 2011-12-12 MED ORDER — FAT EMULSION 20 % IV EMUL
240.0000 mL | INTRAVENOUS | Status: DC
  Filled 2011-12-12: qty 250

## 2011-12-12 MED ORDER — SODIUM BICARBONATE 8.4 % IV SOLN: INTRAVENOUS | Status: DC

## 2011-12-12 MED ORDER — ZINC TRACE METAL 1 MG/ML IV SOLN
INTRAVENOUS | Status: DC
  Filled 2011-12-12: qty 1000

## 2011-12-12 MED ORDER — SODIUM CHLORIDE 0.9 % IV SOLN
100.0000 mg | Freq: Every day | INTRAVENOUS | Status: DC
  Administered 2011-12-12 – 2011-12-14 (×3): 100 mg via INTRAVENOUS
  Filled 2011-12-12 (×3): qty 100

## 2011-12-12 MED ORDER — SODIUM BICARBONATE 8.4 % IV SOLN
INTRAVENOUS | Status: DC
  Administered 2011-12-12 – 2011-12-13 (×2): via INTRAVENOUS
  Filled 2011-12-12 (×5): qty 1000

## 2011-12-12 MED ORDER — SODIUM CHLORIDE 0.9 % IV SOLN
100.0000 mg | Freq: Every day | INTRAVENOUS | Status: DC
  Filled 2011-12-12: qty 100

## 2011-12-12 NOTE — Progress Notes (Signed)
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 Iohexol Itching    IVP Dye ok if taken with benadryl  . Penicillins Itching   Patient Measurements: Height: 5\' 8"  (172.7 cm) Weight: 161 lb 6 oz (73.2 kg) IBW/kg (Calculated) : 63.9   Vital Signs: Temp: 98.9 F (37.2 C) (08/02 1450) Temp src: Oral (08/02 1450) BP: 125/73 mmHg (08/02 1450) Pulse Rate: 60  (08/02 1450)  Labs:  Basename 12/12/11 0610 12/11/11 0512 12/10/11 1000 12/10/11 0645  HGB 6.5* 7.5* -- --  HCT 19.7* 22.7* -- 22.9*  PLT 68* 75* -- 84*  APTT -- -- -- --  LABPROT 25.2* 27.9* 22.4* --  INR 2.24* 2.56* 1.93* --  HEPARINUNFRC -- -- -- --  CREATININE 2.49* 2.39* -- 2.42*  CKTOTAL -- -- -- --  CKMB -- -- -- --  TROPONINI -- -- -- --   Estimated Creatinine Clearance: 23.9 ml/min (by C-G formula based on Cr of 2.49).  Medications:  Prescriptions prior to admission  Medication Sig Dispense Refill  . ADULT TPN Inject 2,100 mLs into the vein continuous. Infuse over 10 hours with 1 hour ramp up and 1 hour ramp down. Total bag per day provides: 250g Dextrose 70%, 100g Travasol 10%, 45g Intralipids 30%, various electrolytes, MVI, TE, Zinc, selenium.      . calcium carbonate (TUMS EX) 750 MG chewable tablet Chew 2 tablets by mouth 3 (three) times daily. Calcium      . ciprofloxacin (CIPRO) 500 MG tablet TAKE 1 TABLET BY MOUTH TWICE DAILY FOR 1 WEEK AT A TIME INTERMITTENTLY AS DIRECTED FOR BACTERIAL OVERGROWTH  28 tablet  4  . diphenhydrAMINE (BENADRYL) 25 mg capsule Take 25 mg by mouth every 6 (six) hours as needed. For itching/sleep.      . Eflornithine HCl (VANIQA) 13.9 % cream Apply 1 application topically 2 (two) times daily with a meal. Applied to facial hair.      . Multiple Vitamin (MULTIVITAMIN WITH MINERALS) TABS Take 1 tablet by mouth daily.      Marland Kitchen  omeprazole (PRILOSEC) 40 MG capsule Take 40 mg by mouth daily.      . Parenteral Electrolytes (TPN ELECTROLYTES IV) Inject 1 each into the vein at bedtime.       . promethazine (PHENERGAN) 25 MG tablet Take 25 mg by mouth every 6 (six) hours as needed. For nausea.      . ursodiol (ACTIGALL) 300 MG capsule TAKE 1 CAPSULE BY MOUTH TWICE DAILY  60 capsule  5  . Vitamin D, Ergocalciferol, (DRISDOL) 50000 UNITS CAPS ALTERNATE TAKING 1 CAPSULE BY MOUTH EVERY OTHER DAY AND 2 CAPSULES BY MOUTH EVERY OTHER DAY  45 capsule  1  . warfarin (COUMADIN) 10 MG tablet Take 1 tablet (10 mg total) by mouth daily.  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 Welton Flakes.  Such a narrow INR goal may be difficult in this patient with low gut absorption (therefore, low Vitamin K absorption).  Home dose reported as 10 mg daily.   INR >4 on admission. Warfarin restarted 7/30 after INR <2 after transfusions.  INR unstable, close to therapeutic range. Lovenox d/c'd 8/1. Home 10 mg dose appears too high.  SCr  continues to rise, Cl ~108ml/min.  Hgb trending down 7.5-->6.5, plts also drifting down.  Pt receiving 2 units PRBCs.    Goal of Therapy:  INR 2.5-3. Monitor platelets daily.   Plan:  Communicated with Dr. Gonzella Lex, will hold Coumadin for tonight and reassess in AM.  Daily PT/INR  Geoffry Paradise, PharmD, BCPS Pager: [phone] 3:50 PM

## 2011-12-12 NOTE — Progress Notes (Signed)
DC'd patient's PICC line  Site U. Patient has been changing the PICC drsg while in the hospital and at home per her request.  She states that she cleans the site with chloraprep and before placing the dressing, she wipes the area w/ alcohol.  I explained that it is better not to clean with alcohol but just the chloaprep due to it's better bacteriostatic properties.

## 2011-12-12 NOTE — Consult Note (Signed)
Infectious Diseases Initial Consultation  Reason for Consultation:  Antibiotic management   HPI: [name] [name] is a 61 y.o. 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  Had acute renal failure.  Does not take much po.  Has had her current picc since Sibyl of this year.  No history of picc line infections.  She does take periodic antibiotics.  Also with pancytopenia.    Past Medical History  Diagnosis Date  . Short bowel syndrome   . Abnormal LFTs   . Vitamin d deficiency   . Personal history of colonic polyps   . Thrombophilia   . Diabetes mellitus     resolved after weight loss  . Obesity     prior to weight loss  . Allergic rhinosinusitis   . At risk for dental problems   . Fracture of left clavicle   . Osteoporosis   . Anemia of chronic disease   . Renal insufficiency   . Atypical nevus   . Pathologic fracture of neck of femur   . Pancytopenia 10/07/2011  . Small bowel ischemia   . Superior mesenteric artery stenosis   . Bacterial overgrowth syndrome     Allergies:  Allergies  Allergen Reactions  . Iohexol Itching    IVP Dye ok if taken with benadryl  . Penicillins Itching    Current antibiotics:   MEDICATIONS:    . B-complex with vitamin C  1 tablet Oral Daily  . diphenoxylate-atropine  1 tablet Oral TID  . feeding supplement  237 mL Oral Daily  . feeding supplement  1 Container Oral Q1400  . fluconazole (DIFLUCAN) IV  200 mg Intravenous QHS  . multivitamin with minerals  1 tablet Oral Daily  . pantoprazole  40 mg Oral Q1200  . warfarin  1 mg Oral ONCE-1800  . Warfarin - Pharmacist Dosing Inpatient   Does not apply q1800    History  Substance Use Topics  . Smoking status: Current Some Day Smoker    Types: Cigarettes    Last Attempt to Quit: 05/12/2004  . Smokeless tobacco: Not on file  . Alcohol Use: No    Family History  Problem  Relation Age of Onset  . Diabetes Mother   . Hypertension Mother   . Colon cancer Neg Hx     Review of Systems - Negative except as per HPI  OBJECTIVE: Temp:  [98.2 F (36.8 C)-103 F (39.4 C)] 98.2 F (36.8 C) (08/02 1110) Pulse Rate:  [64-76] 64  (08/02 1110) Resp:  [16-18] 18  (08/02 1110) BP: (101-135)/(59-74) 124/74 mmHg (08/02 1110) SpO2:  [98 %-100 %] 98 % (08/02 0431) Weight:  [161 lb 6 oz (73.2 kg)] 161 lb 6 oz (73.2 kg) (08/02 0431) General appearance: alert, cooperative and no distress Resp: clear to auscultation bilaterally Cardio: regular rate and rhythm, S1, S2 normal, no murmur, click, rub or gallop GI: soft, non-tender; bowel sounds normal; no masses,  no organomegaly Skin: left arm picc, c/d/i  LABS: Results for orders placed during the hospital encounter of 12/08/11 (from the past 48 hour(s))  SODIUM, URINE, RANDOM     Status: Normal   Collection Time   12/10/11  2:29 PM      Component Value Range Comment   Sodium, Ur 107     GLUCOSE, CAPILLARY     Status: Normal   Collection Time   12/10/11  5:15 PM      Component Value Range Comment   Glucose-Capillary 70  70 - 99 mg/dL   GLUCOSE, CAPILLARY     Status: Abnormal   Collection Time   12/11/11  4:44 AM      Component Value Range Comment   Glucose-Capillary 128 (*) 70 - 99 mg/dL   COMPREHENSIVE METABOLIC PANEL     Status: Abnormal   Collection Time   12/11/11  5:12 AM      Component Value Range Comment   Sodium 131 (*) 135 - 145 mEq/L    Potassium 3.6  3.5 - 5.1 mEq/L    Chloride 108  96 - 112 mEq/L    CO2 14 (*) 19 - 32 mEq/L    Glucose, Bld 115 (*) 70 - 99 mg/dL    BUN 32 (*) 6 - 23 mg/dL    Creatinine, Ser 9.14 (*) 0.50 - 1.10 mg/dL    Calcium 7.2 (*) 8.4 - 10.5 mg/dL    Total Protein 7.7  6.0 - 8.3 g/dL    Albumin 2.4 (*) 3.5 - 5.2 g/dL    AST 24  0 - 37 U/L    ALT 22  0 - 35 U/L    Alkaline Phosphatase 126 (*) 39 - 117 U/L    Total Bilirubin 0.5  0.3 - 1.2 mg/dL    GFR calc non Af Amer 21 (*)  >90 mL/min    GFR calc Af Amer 24 (*) >90 mL/min   MAGNESIUM     Status: Normal   Collection Time   12/11/11  5:12 AM      Component Value Range Comment   Magnesium 1.8  1.5 - 2.5 mg/dL   PHOSPHORUS     Status: Normal   Collection Time   12/11/11  5:12 AM      Component Value Range Comment   Phosphorus 4.1  2.3 - 4.6 mg/dL   CBC     Status: Abnormal   Collection Time   12/11/11  5:12 AM      Component Value Range Comment   WBC 3.2 (*) 4.0 - 10.5 K/uL    RBC 2.74 (*) 3.87 - 5.11 MIL/uL    Hemoglobin 7.5 (*) 12.0 - 15.0 g/dL    HCT 78.2 (*) 95.6 - 46.0 %    MCV 82.8  78.0 - 100.0 fL    MCH 27.4  26.0 - 34.0 pg    MCHC 33.0  30.0 - 36.0 g/dL    RDW 21.3 (*) 08.6 - 15.5 %    Platelets 75 (*) 150 - 400 K/uL CONSISTENT WITH PREVIOUS RESULT  PROTIME-INR     Status: Abnormal   Collection Time   12/11/11  5:12 AM      Component Value Range Comment   Prothrombin Time 27.9 (*) 11.6 - 15.2 seconds    INR 2.56 (*) 0.00 - 1.49   GLUCOSE, CAPILLARY     Status: Normal   Collection Time   12/11/11 11:52 AM      Component Value Range Comment   Glucose-Capillary 71  70 - 99 mg/dL    Comment 1 Documented in Chart      Comment 2 Notify RN     GLUCOSE, CAPILLARY     Status: Abnormal   Collection Time   12/11/11  6:01 PM      Component Value Range Comment   Glucose-Capillary 65 (*) 70 - 99 mg/dL   GLUCOSE, CAPILLARY     Status:  Normal   Collection Time   12/11/11  6:26 PM      Component Value Range Comment   Glucose-Capillary 77  70 - 99 mg/dL   URINALYSIS, ROUTINE W REFLEX MICROSCOPIC     Status: Abnormal   Collection Time   12/11/11  7:39 PM      Component Value Range Comment   Color, Urine YELLOW  YELLOW    APPearance CLOUDY (*) CLEAR    Specific Gravity, Urine 1.015  1.005 - 1.030    pH 5.5  5.0 - 8.0    Glucose, UA NEGATIVE  NEGATIVE mg/dL    Hgb urine dipstick LARGE (*) NEGATIVE    Bilirubin Urine NEGATIVE  NEGATIVE    Ketones, ur NEGATIVE  NEGATIVE mg/dL    Protein, ur 413 (*) NEGATIVE  mg/dL    Urobilinogen, UA 0.2  0.0 - 1.0 mg/dL    Nitrite NEGATIVE  NEGATIVE    Leukocytes, UA TRACE (*) NEGATIVE   URINE MICROSCOPIC-ADD ON     Status: Abnormal   Collection Time   12/11/11  7:39 PM      Component Value Range Comment   Squamous Epithelial / LPF FEW (*) RARE    WBC, UA 0-2  <3 WBC/hpf    RBC / HPF TOO NUMEROUS TO COUNT  <3 RBC/hpf    Bacteria, UA FEW (*) RARE   PROTEIN / CREATININE RATIO, URINE     Status: Abnormal   Collection Time   12/11/11  7:40 PM      Component Value Range Comment   Creatinine, Urine 25.86      Total Protein, Urine 56.6   NO NORMAL RANGE ESTABLISHED FOR THIS TEST   PROTEIN CREATININE RATIO 2.19 (*) 0.00 - 0.15   GLUCOSE, CAPILLARY     Status: Normal   Collection Time   12/11/11 10:00 PM      Component Value Range Comment   Glucose-Capillary 94  70 - 99 mg/dL   GLUCOSE, CAPILLARY     Status: Normal   Collection Time   12/12/11  2:47 AM      Component Value Range Comment   Glucose-Capillary 97  70 - 99 mg/dL   CBC     Status: Abnormal   Collection Time   12/12/11  6:10 AM      Component Value Range Comment   WBC 3.6 (*) 4.0 - 10.5 K/uL    RBC 2.36 (*) 3.87 - 5.11 MIL/uL    Hemoglobin 6.5 (*) 12.0 - 15.0 g/dL    HCT 24.4 (*) 01.0 - 46.0 %    MCV 83.5  78.0 - 100.0 fL    MCH 27.5  26.0 - 34.0 pg    MCHC 33.0  30.0 - 36.0 g/dL    RDW 27.2 (*) 53.6 - 15.5 %    Platelets 68 (*) 150 - 400 K/uL   PROTIME-INR     Status: Abnormal   Collection Time   12/12/11  6:10 AM      Component Value Range Comment   Prothrombin Time 25.2 (*) 11.6 - 15.2 seconds    INR 2.24 (*) 0.00 - 1.49   BASIC METABOLIC PANEL     Status: Abnormal   Collection Time   12/12/11  6:10 AM      Component Value Range Comment   Sodium 131 (*) 135 - 145 mEq/L    Potassium 3.0 (*) 3.5 - 5.1 mEq/L    Chloride 109  96 - 112 mEq/L  CO2 11 (*) 19 - 32 mEq/L REPEATED TO VERIFY   Glucose, Bld 89  70 - 99 mg/dL    BUN 29 (*) 6 - 23 mg/dL    Creatinine, Ser 4.54 (*) 0.50 - 1.10 mg/dL      Calcium 6.8 (*) 8.4 - 10.5 mg/dL    GFR calc non Af Amer 20 (*) >90 mL/min    GFR calc Af Amer 23 (*) >90 mL/min   PREPARE RBC (CROSSMATCH)     Status: Normal   Collection Time   12/12/11  9:00 AM      Component Value Range Comment   Order Confirmation ORDER PROCESSED BY BLOOD BANK     TYPE AND SCREEN     Status: Normal (Preliminary result)   Collection Time   12/12/11  9:25 AM      Component Value Range Comment   ABO/RH(D) B POS      Antibody Screen NEG      Sample Expiration 12/15/2011      Unit Number [id number]      Blood Component Type RED CELLS,LR      Unit division 00      Status of Unit ISSUED      Transfusion Status OK TO TRANSFUSE      Crossmatch Result Compatible      Unit Number [id number]      Blood Component Type RED CELLS,LR      Unit division 00      Status of Unit ALLOCATED      Transfusion Status OK TO TRANSFUSE      Crossmatch Result Compatible       MICRO:  IMAGING: No results found.  HISTORICAL MICRO/IMAGING  Assessment/Plan:   1) Fungemia - yeast 2/2 blood cultures.  No history of line infection.  She needs to have her PICC removed and line holiday until blood cultures clear for 72 hours.  I will change her to micafungin.  She will need 14-21 days of therapy.  Check TTE, she will need an eye exam as outpatient.   All this discussed with patient.   -she may need nutrition and GI to advise on oral po intake in the meantime or ppn?  Thanks for the consult, will follow along.

## 2011-12-12 NOTE — Progress Notes (Signed)
MD made aware via phone of CBG 55. Pt is without changes in assessment and voices no complaints. Pt eating a milkshake brought in by family. Will moniter and recheck cbg in one hour.

## 2011-12-12 NOTE — Progress Notes (Signed)
Lab called to confirm that second aerobic bottle was positive for yeast as well.

## 2011-12-12 NOTE — Progress Notes (Signed)
Subjective: Bad night, vomiting and diarrhea. Blood cx's came back + for yeast, and PICC has been removed. On antifungals per ID.   Objective Vital signs in last 24 hours: Filed Vitals:   12/12/11 1310 12/12/11 1335 12/12/11 1420 12/12/11 1450  BP: 123/77 120/72 122/70 125/73  Pulse: 60 62 60 60  Temp: 98.7 F (37.1 C) 98.4 F (36.9 C) 98.3 F (36.8 C) 98.9 F (37.2 C)  TempSrc: Oral Oral Oral Oral  Resp: 18 18 20 18   Height:      Weight:      SpO2:       Weight change: 0 kg (0 lb)  Intake/Output Summary (Last 24 hours) at 12/12/11 1531 Last data filed at 12/12/11 1335  Gross per 24 hour  Intake 5474.99 ml  Output      0 ml  Net 5474.99 ml   Labs: Basic Metabolic Panel:  Lab 12/12/11 1610 12/11/11 0512 12/10/11 0645 12/09/11 1730 12/08/11 1757  NA 131* 131* 130* 128* 122*  K 3.0* 3.6 4.1 3.9 4.3  CL 109 108 105 100 94*  CO2 11* 14* 17* 18* 17*  GLUCOSE 89 115* 131* 64* 77  BUN 29* 32* 40* 37* 43*  CREATININE 2.49* 2.39* 2.42* 2.38* 2.27*  ALB -- -- -- -- --  CALCIUM 6.8* 7.2* 7.6* 7.7* 8.1*  PHOS -- 4.1 4.7* -- 2.7   Liver Function Tests:  Lab 12/11/11 0512 12/10/11 0645 12/09/11 1730  AST 24 22 26   ALT 22 20 23   ALKPHOS 126* 115 130*  BILITOT 0.5 0.8 1.2  PROT 7.7 7.5 8.4*  ALBUMIN 2.4* 2.4* 2.7*   No results found for this basename: LIPASE:3,AMYLASE:3 in the last 168 hours No results found for this basename: AMMONIA:3 in the last 168 hours CBC:  Lab 12/12/11 0610 12/11/11 0512 12/10/11 0645 12/09/11 1730 12/08/11 1757  WBC 3.6* 3.2* 3.6* 3.9* --  NEUTROABS -- -- 2.0 -- 2.4  HGB 6.5* 7.5* 7.7* 8.0* --  HCT 19.7* 22.7* 22.9* 24.2* --  MCV 83.5 82.8 82.4 81.5 --  PLT 68* 75* 84* 77* --   PT/INR: @labrcntip (inr:5) Cardiac Enzymes: No results found for this basename: CKTOTAL:5,CKMB:5,CKMBINDEX:5,TROPONINI:5 in the last 168 hours CBG:  Lab 12/12/11 0247 12/11/11 2200 12/11/11 1826 12/11/11 1801 12/11/11 1152  GLUCAP 97 94 77 65* 71    Iron  Studies: No results found for this basename: IRON:30,TIBC:30,TRANSFERRIN:30,FERRITIN:30 in the last 168 hours  Physical Exam:  Blood pressure 125/73, pulse 60, temperature 98.9 F (37.2 C), temperature source Oral, resp. rate 18, height 5\' 8"  (1.727 m), weight 73.2 kg (161 lb 6 oz), SpO2 98.00%.  Gen: alert, thin adult female, no distress  Skin: no rash, cyanosis  HEENT: EOMI, sclera anicteric, throat slightly dry  Neck: no JVD, loud deep R carotid bruit, no LAN  Chest: clear bilat, good air movement  Heart: regular, no rub or gallop, +2/6 SEM LUSB  Abdomen: soft, nontender, no HSM, no ascites, healed scars from abd surgery  Ext: no LE edema or UE edema  Neuro: alert, Ox3, no focal deficit   UA- 1.013, pH 7, 100 prot, 0-2wbc, TNTC rbc, few bact  UNa- 107  CXR- no acute findings  Renal US- Right kidney 12.9 cm, Left kidney 13.6 cm. +mild increased echogenicity bilat  Impression/Recommendations:  1. Renal failure, acute and/or chronic. Baseline creat was 0.5, but had increased to 1.1 from Dec 2012 to May 2013. Creatinine stable mid 2's here. US shows slightly echogenic kidneys of normal size.  Could be prerenal or due to sepsis. 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 Continue IVF"s. No signs of volume overload. Measure all UOP.   2. 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 Chronic anemia- unclear cause 4. Hematuria- repeat UA 5. R carotid stenosis, moderate   [name] Moselle  MD [facility] [phone] pgr    [phone] cell 12/12/2011, 3:31 PM

## 2011-12-12 NOTE — Progress Notes (Signed)
VASCULAR LAB PRELIMINARY  PRELIMINARY  PRELIMINARY  PRELIMINARY  Carotid duplex completed.    Preliminary report:  Right:  60-79% internal carotid artery stenosis.  Lowest end of range. Left - No evidence of significant ICA stenosis. Bilateral vertebral artery flow is antegrade  [name] [name], RVS 12/12/2011, 2:49 PM

## 2011-12-12 NOTE — Progress Notes (Signed)
CRITICAL VALUE ALERT  Critical value received:  CBG 55  Date of notification:12/12/11  Time of notification:  1610pm  Critical value read back:yes  Nurse who received alert:J Ottis Stain RN  MD notified (1st page): Dr. Gonzella Lex  Time of first page:1615pm  MD notified (2nd page):  Time of second page:  Responding MD:  Dr. Gonzella Lex  Time MD responded:  1625pm

## 2011-12-12 NOTE — Progress Notes (Signed)
CRITICAL VALUE ALERT  Critical value received:  Hgb 6.5  Date of notification:  12/12/11  Time of notification:  8:05am  Critical value read back yes  Nurse who received alert: Beecher Mcardle RN  MD notified (1st page):  Dr. Gonzella Lex  Time of first page:  0800am  MD notified (2nd page):  Time of second page:  Responding MD: Dr. Gonzella Lex  Time MD responded:  08:05am

## 2011-12-12 NOTE — Progress Notes (Signed)
CRITICAL VALUE ALERT  Critical value received: positive for yeast in aerobic blood cultures  Date of notification:  8/1  Time of notification:  1923  Critical value read back:yes  Nurse who received alert:  L Wilhelmenia Blase   MD notified (1st page): NP Schorr  Time of first page:  1940  Responding MD:  Schorr    Pt also had temp of 103 and was vomiting at this time. Abx given per new order, tylenol given as well.

## 2011-12-12 NOTE — Progress Notes (Signed)
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 Patient came in to the hospital with acute anemia and dehydration.   Overnight issues:  patient had a temp spike of 103 overnight with blood cx growing yeast.  Assessment/ plan:  pancytopenia -Patient has history of chronic anemia secondary to chronic disease, she follows with Dr. Welton Flakes  -Patient hemoglobin usually between 8.5-9, she came in with hemoglobin of 7.2.  -Status post transfusion of 2 units of packed RBCs, post-transfusion CBC improved but this morning dropped again to 6.5. Again ordered for 2 u PRBC  -She had recent 5 negative cards for FOBT on 11/03/2011. She has negative EGD/colonoscopy last year.  -appreciate GI eval. i discussed with her hematologist Dr Welton Flakes who feels her chronic pancytopenia is due to chronic underlying illness and given her anemia at baseline would not need bone marrow bx as inpatient and will plan as outpatient  -gets monthly arenesp injection.  -She will be evaluated by Dr Welton Flakes during the hospital stay. -recheck h&H in am   Fever with candidal bacteremia Patient had temp spike of 103 on 8/1 with both blood cx from 7/31 growing yeast  started on fluconazole which has been changed to micafungin by ID Dr. Luciana Axe, recommends 14-21 days of treatment, getting a TTE and ophthalmology evaluation as outpatient -PICC line removed. We'll get repeat blood culture to evaluate for clearing and plan on placing another PICC line after that.   Short gut syndrome  -Patient is on total parenteral nutrition secondary to short gut (reported 1 foot left of her small bowel).  -Patient mentioned of missing out on nutrients and MVs Due to shortage in the injectable  multivitamins.  -holding off on TPN today as PICC is removed . Continue IV fluids for now until another PICC placed in few days   Acute renal failure  initially thought likely secondary to dehydration. Renal USG unremarkable  -renal function not improved with IV hydration for past few days  -as per GI could possibly be associated with TPN as well.  -appreciate renal recs -low bicarb also noted. Ordered 2 amps bicarb with D5.  -Monitor in am  Hyponatremia  - likely secondary to dehydration and acute renal failure.  -slowly improving with fluids . Monitor in am   Hypokalemia Replenish potassium chloride in the fluids Diarrhea  : chronic  improved with lomotil   Hematuria  -Patient has painless hematuria occasinally but stable   DVT prophylaxis  On coumadin   Code Status:full  Family Communication: daughter at bedside  Disposition Plan: home once stable  Consultants:  [facility] Procedures:  None Antibiotics:  None HPI/Subjective:  Patient seen and examined this am. Denies any symptoms. Diarrhea improved with lomotil.   Objective: Filed Vitals:   12/12/11 1210 12/12/11 1310 12/12/11 1335 12/12/11 1420  BP: 115/68 123/77 120/72 122/70  Pulse: 62 60 62 60  Temp: 98.3 F (36.8 C) 98.7 F (37.1 C) 98.4 F (36.9 C) 98.3 F (36.8 C)  TempSrc: Oral Oral Oral Oral  Resp: 16 18 18 20   Height:      Weight:      SpO2:        Intake/Output Summary (Last 24 hours) at 12/12/11 1448 Last data filed at 12/12/11 1335  Gross per 24 hour  Intake 6474.99 ml  Output      0 ml  Net 6474.99 ml    Exam:  General: Elderly female in NAD  HEENT: no pallor, dry oral mucosa  Cardiovascular: N S1&S2, no murmurs  Respiratory: clear b/l no added sounds  Abdomen: old laprotomy scar , soft, NT, ND, BS+  CNS: AAO X 3 , non focal   Data Reviewed: Basic Metabolic Panel:  Lab 12/12/11 4098 12/11/11 0512 12/10/11 0645 12/09/11 1730 12/08/11 1757  NA 131* 131* 130* 128* 122*  K  3.0* 3.6 4.1 3.9 4.3  CL 109 108 105 100 94*  CO2 11* 14* 17* 18* 17*  GLUCOSE 89 115* 131* 64* 77  BUN 29* 32* 40* 37* 43*  CREATININE 2.49* 2.39* 2.42* 2.38* 2.27*  CALCIUM 6.8* 7.2* 7.6* 7.7* 8.1*  MG -- 1.8 2.1 -- 1.7  PHOS -- 4.1 4.7* -- 2.7   Liver Function Tests:  Lab 12/11/11 0512 12/10/11 0645 12/09/11 1730 12/08/11 1757  AST 24 22 26 26   ALT 22 20 23 24   ALKPHOS 126* 115 130* 140*  BILITOT 0.5 0.8 1.2 0.6  PROT 7.7 7.5 8.4* 8.1  ALBUMIN 2.4* 2.4* 2.7* 2.6*   No results found for this basename: LIPASE:5,AMYLASE:5 in the last 168 hours No results found for this basename: AMMONIA:5 in the last 168 hours CBC:  Lab 12/12/11 0610 12/11/11 0512 12/10/11 0645 12/09/11 1730 12/08/11 1757  WBC 3.6* 3.2* 3.6* 3.9* 3.8*  NEUTROABS -- -- 2.0 -- 2.4  HGB 6.5* 7.5* 7.7* 8.0* 7.2*  HCT 19.7* 22.7* 22.9* 24.2* 21.8*  MCV 83.5 82.8 82.4 81.5 80.7  PLT 68* 75* 84* 77* 70*   Cardiac Enzymes: No results found for this basename: CKTOTAL:5,CKMB:5,CKMBINDEX:5,TROPONINI:5 in the last 168 hours BNP (last 3 results) No results found for this basename: PROBNP:3 in the last 8760 hours CBG:  Lab 12/12/11 0247 12/11/11 2200 12/11/11 1826 12/11/11 1801 12/11/11 1152  GLUCAP 97 94 77 65* 71    Recent Results (from the past 240 hour(s))  CULTURE, BLOOD (ROUTINE X 2)     Status: Normal (Preliminary result)   Collection Time   12/09/11  2:15 PM      Component Value Range Status Comment   Specimen Description BLOOD RIGHT ARM   Final    Special Requests BOTTLES DRAWN AEROBIC AND ANAEROBIC 5CC   Final    Culture  Setup Time 12/10/2011 00:49   Final    Culture     Final    Value: YEAST     Note: Gram Stain Report Called to,Read Back By and Verified With: LORI HARRIS @0330  ON 12/12/2011 BY MCLET   Report Status PENDING   Incomplete   CULTURE, BLOOD (ROUTINE X 2)     Status: Normal (Preliminary result)   Collection Time   12/09/11  2:25 PM      Component Value Range Status Comment   Specimen  Description Blood   Final    Special Requests Normal   Final    Culture  Setup Time 12/10/2011 00:25   Final    Culture     Final    Value: YEAST     Note: Gram Stain Report Called to,Read Back By and Verified With: LAURIE HARRIS @ 1923 ON 12/11/11 BY GOLLD   Report Status PENDING   Incomplete      Studies: Dg Chest 2 View  12/09/2011  *RADIOLOGY REPORT*  Clinical Data: Fever and weakness.  Ex-smoker.  CHEST - 2 VIEW  Comparison: CT of 10/24/2011 and plain film of 01/17/2011.  Findings: A left-sided PICC line which is unchanged and terminates at the low SVC. Midline trachea.  Borderline cardiomegaly.  Age advanced aortic atherosclerosis. No pleural effusion or pneumothorax.  The reticular nodular opacities described on the prior plain film are improved to resolved.  Mild interstitial prominence remains, and is likely related to the clinical history of  prior smoking. No lobar consolidation.  IMPRESSION: Interstitial thickening, likely related to prior smoking / chronic bronchitis.  No evidence of acute superimposed pneumonia.  Original Report Authenticated By: Consuello Bossier, M.D.   US Renal  12/09/2011  *RADIOLOGY REPORT*  Clinical Data: Hematuria.  History of diabetes.  RENAL/URINARY TRACT ULTRASOUND COMPLETE  Comparison:  Abdominal ultrasound 10/29/2011.  Findings:  Right Kidney:  Mild diffuse increased echogenicity of the renal parenchyma is nonspecific.  No focal cystic or solid renal lesions. 12.9 cm in length.  No hydronephrosis. No definite calculi.  Left Kidney:  Mild diffuse increased echogenicity of the renal parenchyma is nonspecific.  No focal cystic or solid renal lesions. 13.6 cm in length.  No hydronephrosis. No definite calculi.  Bladder:  Well distended without focal wall abnormalities.  IMPRESSION: 1.  Mild diffusely increased echogenicity of the the renal parenchyma bilaterally is nonspecific, but could suggest underlying medical renal disease. 2.  No focal cystic or solid renal  lesions, and no definite evidence of calculi within the collecting systems.  Original Report Authenticated By: Florencia Reasons, M.D.    Scheduled Meds:   . B-complex with vitamin C  1 tablet Oral Daily  . diphenoxylate-atropine  1 tablet Oral TID  . feeding supplement  237 mL Oral Daily  . feeding supplement  1 Container Oral Q1400  . micafungin (MYCAMINE) IV  100 mg Intravenous QPC supper  . multivitamin with minerals  1 tablet Oral Daily  . pantoprazole  40 mg Oral Q1200  . warfarin  1 mg Oral ONCE-1800  . Warfarin - Pharmacist Dosing Inpatient   Does not apply q1800  . DISCONTD: fluconazole (DIFLUCAN) IV  200 mg Intravenous QHS  . DISCONTD: micafungin (MYCAMINE) IV  100 mg Intravenous Daily   Continuous Infusions:   . dextrose 5 % 1,000 mL with potassium chloride 40 mEq, sodium bicarbonate 100 mEq infusion 100 mL/hr at 12/12/11 1107  . fat emulsion 240 mL (12/10/11 1802)  . fat emulsion    . TPN (CLINIMIX) +/- additives 50 mL/hr at 12/11/11 0444  . TPN (CLINIMIX) +/- additives 50 mL/hr at 12/12/11 0500  . TPN (CLINIMIX) +/- additives    . DISCONTD: sodium chloride 250 mL/hr at 12/11/11 1811  . DISCONTD: dextrose 5 % and 0.45% NaCl 1,000 mL with potassium chloride 40 mEq, sodium bicarbonate 100 mEq infusion         Time spent: 35 minutes    Rhyse Loux  [facility] Pager [phone]. If 8PM-8AM, please contact night-coverage at [email or web address], password [facility] 12/12/2011, 2:48 PM  LOS: 4 days

## 2011-12-12 NOTE — Progress Notes (Signed)
*  PRELIMINARY RESULTS* Echocardiogram 2D Echocardiogram has been performed.  Jeryl Columbia 12/12/2011, 4:26 PM

## 2011-12-12 NOTE — Progress Notes (Signed)
PARENTERAL NUTRITION CONSULT NOTE - Follow Up  Pharmacy Consult for TNA Indication: Short Gut Syndrome  Allergies  Allergen Reactions  . Iohexol Itching    IVP Dye ok if taken with benadryl  . Penicillins Itching    Patient Measurements: Height: 5\' 8"  (172.7 cm) Weight: 161 lb 6 oz (73.2 kg) IBW/kg (Calculated) : 63.9  Adjusted Body Weight: 67.4kg Usual Weight: ~155 lbs (70.4kg) - per pt report  Vital Signs: Temp: 99.9 F (37.7 C) (08/02 0431) Temp src: Oral (08/02 0431) BP: 123/66 mmHg (08/02 0431) Pulse Rate: 74  (08/02 0431) Intake/Output from previous day: 08/01 0701 - 08/02 0700 In: 6270.4 [P.O.:480; I.V.:4510.4; IV Piggyback:100; TPN:1180] Out: -  Intake/Output from this shift:    Labs:  Basename 12/12/11 0610 12/11/11 0512 12/10/11 1000 12/10/11 0645 12/09/11 1730  WBC -- 3.2* -- 3.6* 3.9*  HGB -- 7.5* -- 7.7* 8.0*  HCT -- 22.7* -- 22.9* 24.2*  PLT -- 75* -- 84* 77*  APTT -- -- -- -- --  INR 2.24* 2.56* 1.93* -- --     Basename 12/12/11 0610 12/11/11 1940 12/11/11 0512 12/10/11 0645 12/09/11 1730  NA 131* -- 131* 130* --  K 3.0* -- 3.6 4.1 --  CL 109 -- 108 105 --  CO2 11* -- 14* 17* --  GLUCOSE 89 -- 115* 131* --  BUN 29* -- 32* 40* --  CREATININE 2.49* -- 2.39* 2.42* --  LABCREA -- 25.86 -- -- --  CREAT24HRUR -- -- -- -- --  CALCIUM 6.8* -- 7.2* 7.6* --  MG -- -- 1.8 2.1 --  PHOS -- -- 4.1 4.7* --  PROT -- -- 7.7 7.5 8.4*  ALBUMIN -- -- 2.4* 2.4* 2.7*  AST -- -- 24 22 26   ALT -- -- 22 20 23   ALKPHOS -- -- 126* 115 130*  BILITOT -- -- 0.5 0.8 1.2  BILIDIR -- -- -- -- --  IBILI -- -- -- -- --  PREALBUMIN -- -- -- 12.3* --  TRIG -- -- -- 61 --  CHOLHDL -- -- -- -- --  CHOL -- -- -- 46 --   Estimated Creatinine Clearance: 23.9 ml/min (by C-G formula based on Cr of 2.49).    Basename 12/12/11 0247 12/11/11 2200 12/11/11 1826  GLUCAP 97 94 77    Medical History: Past Medical History  Diagnosis Date  . Short bowel syndrome   .  Abnormal LFTs   . Vitamin d deficiency   . Personal history of colonic polyps   . Thrombophilia   . Diabetes mellitus     resolved after weight loss  . Obesity     prior to weight loss  . Allergic rhinosinusitis   . At risk for dental problems   . Fracture of left clavicle   . Osteoporosis   . Anemia of chronic disease   . Renal insufficiency   . Atypical nevus   . Pathologic fracture of neck of femur   . Pancytopenia 10/07/2011  . Small bowel ischemia   . Superior mesenteric artery stenosis   . Bacterial overgrowth syndrome     Medications:  Scheduled:     . B-complex with vitamin C  1 tablet Oral Daily  . diphenoxylate-atropine  1 tablet Oral TID  . feeding supplement  237 mL Oral Daily  . feeding supplement  1 Container Oral Q1400  . fluconazole (DIFLUCAN) IV  200 mg Intravenous QHS  . multivitamin with minerals  1 tablet Oral Daily  . pantoprazole  40 mg Oral Q1200  . warfarin  1 mg Oral ONCE-1800  . Warfarin - Pharmacist Dosing Inpatient   Does not apply q1800  . DISCONTD: enoxaparin (LOVENOX) injection  70 mg Subcutaneous Q24H   Infusions:     . sodium chloride 250 mL/hr at 12/11/11 1811  . fat emulsion 240 mL (12/10/11 1802)  . TPN (CLINIMIX) +/- additives 50 mL/hr at 12/11/11 0444  . TPN (CLINIMIX) +/- additives 50 mL/hr at 12/12/11 0500    Insulin Requirements in the past 24 hours:  No SSI required.  CBGs 65-97  Nutritional Goals:  Per RD assessment 7/30: Kcal: 1600-1750 kcal, Protein: 58-72g , Fluid: 2.5 L  Goal TNA (adjusting per RD recommendations) = Clinimx E 4.25/25 - infused over 10-12 hours plus IV lipids over 10-12hr on MWF to provide 61g Protein/day, 1948 kcal/day on lipid days, 1468 on non-lipid days, for an average of 1674 kcal/day.  Current Nutrition:  Regular diet, PO intake recorded but pt not likely absorbing (see assessment below) Chronic home TNA  Assessment: 61 yo F admitted 12/08/11 with CC: low grade fever and inability to  eat for several months, found to be in worsening renal failure, ultimately admitted for dehydration/renal failure. 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 She is on chronic TNA at home (for 7 years, per pt report) infused via PICC (placed 08/13/11 at [facility] per report) Pt also has hx of abnormal LFTs, DM ("resolved after weight loss"), erosive esophagitis, anemia of chronic dz, and renal insufficiency. She is on abx frequently for bacteria overgrowth in bowel. TNA resumed 7/30 inpatient.  Pharmacist had lengthy discussion with patient on 7/30, who reports that she's been on TNA for 7 years. She reports that she's allowed to eat "whatever she wants" at home in addition to the TNA, but the food "usually passes right though her" (her daughter estimates within 30 mins). For the past couple weeks, patient reports having no appetite; however, she mentions she is hungry this morning. Spoke with a family member over the phone (granddaughter) who read her home TNA label to me, which indicates: 2100 ml total cycled over 10 hours with a 1 hour ramp up and 1 hour ramp down. TNA bag ( ) contains: 250g Dextrose, 100g Travasol 10%, 45g Intralipids 30%, MVI, TE, Zinc, Selenium, and various electrolytes. Per patient report, TNA bag also contains B-complex vitamins and folic acid, however this information was not relayed to me over the phone. Bag does not contain any insulin. This home TNA provides approx 45g Protein/day and 1675 Kcal/day. Patient is agreeable to using [place] supplied TNA and is ok if TNA is cycled over 12 hours instead of usual 10 hours.  TNA goals adjusted to RD's recommendations inpatient: kcal goal remains the same; however, RD recommends a higher protein goal.  Advancing to higher protein supplementation is limited by the patient's current renal failure.  Labs: Lytes: Hyponatremia (unable to adjust Na+ content of TNA,  improving with IVF per MD). K down to 3.0 Renal: SCr continues to rise 2.39-->2.49. CrCl ~ 59ml/min, therefore will not advance TNA today.    LFTs: wnl, Alk Phos slightly elevated CBGs: at goal <150 Prealbumin: 12.3 (7/31) GI PPx: PO Protonix IVF: NS at 125 ml/hr per MD 7/30 Blood Cultures:  Yeast - now on fluconazole 200mg  IV q 24 hours (dose appropriate for renal fxn)   Plan: 1) Due to acute renal failure, will  not advance TNA yet.  Continue Clinimix E 4.25/25, cycled over 12 hours (6p-6a): 47ml/hr x1hr, 72ml/hr x 10hr, 26ml/hr x1 hour (off 6a-6p). 2) IV lipids, MVI, and TE on MWF only due to national back order. Will continue to put MVI and TE in TNA due to short gut syndrome and questionable PO absorption (pt has active order for PO MVI).  Thiamine and Folic acid will be added to TNA daily per MD request.    3) SSI sensitive scale revolving around cyclic admin times (due to hx of DM, even though reportedly resolved).  Will reduce frequency of CBG checks if CBGs remain at goal after TNA rate advanced. 4)IVF per MD, once dehydration resolved per MD would recommend reducing IVF rate while TNA is running between 6p-6a. 5) BMET in AM. TNA labs qMon/Thurs. 6)  Contact MD regarding potassium replacement (per protocol, pharmacy unable to replace when CrCl < 72ml/min)  Haynes Hoehn, PharmD 12/12/2011 7:22 AM  Pager: [phone]

## 2011-12-13 LAB — GLUCOSE, CAPILLARY
Glucose-Capillary: 76 mg/dL (ref 70–99)
Glucose-Capillary: 63 mg/dL — ABNORMAL LOW (ref 70–99)
Glucose-Capillary: 63 mg/dL — ABNORMAL LOW (ref 70–99)
Glucose-Capillary: 66 mg/dL — ABNORMAL LOW (ref 70–99)
Glucose-Capillary: 87 mg/dL (ref 70–99)

## 2011-12-13 LAB — TYPE AND SCREEN: Unit division: 0

## 2011-12-13 LAB — BASIC METABOLIC PANEL
Calcium: 7 mg/dL — ABNORMAL LOW (ref 8.4–10.5)
GFR calc Af Amer: 21 mL/min — ABNORMAL LOW (ref >90)
GFR calc non Af Amer: 18 mL/min — ABNORMAL LOW (ref >90)
Glucose, Bld: 76 mg/dL (ref 70–99)
Potassium: 3.3 mEq/L — ABNORMAL LOW (ref 3.5–5.1)
Sodium: 133 mEq/L — ABNORMAL LOW (ref 135–145)

## 2011-12-13 LAB — NA AND K (SODIUM & POTASSIUM), RAND UR: Potassium Urine: 16 mEq/L

## 2011-12-13 LAB — CBC
HCT: 26 % — ABNORMAL LOW (ref 36.0–46.0)
Hemoglobin: 8.8 g/dL — ABNORMAL LOW (ref 12.0–15.0)
MCH: 27.9 pg (ref 26.0–34.0)
MCHC: 33.8 g/dL (ref 30.0–36.0)
MCV: 82.5 fL (ref 78.0–100.0)
RBC: 3.15 MIL/uL — ABNORMAL LOW (ref 3.87–5.11)

## 2011-12-13 LAB — CHLORIDE, URINE, RANDOM: Chloride Urine: 102 mEq/L

## 2011-12-13 MED ORDER — SODIUM CHLORIDE 0.9 % IV BOLUS (SEPSIS)
1000.0000 mL | Freq: Once | INTRAVENOUS | Status: AC
  Administered 2011-12-13: 1000 mL via INTRAVENOUS

## 2011-12-13 MED ORDER — STERILE WATER FOR INJECTION IV SOLN
INTRAVENOUS | Status: DC
  Administered 2011-12-13 – 2011-12-14 (×3): via INTRAVENOUS
  Filled 2011-12-13 (×6): qty 850

## 2011-12-13 MED ORDER — DEXTROSE 50 % IV SOLN
INTRAVENOUS | Status: AC
  Administered 2011-12-13: 25 mL
  Filled 2011-12-13: qty 50

## 2011-12-13 MED ORDER — WARFARIN SODIUM 2 MG PO TABS
2.0000 mg | ORAL_TABLET | Freq: Once | ORAL | Status: AC
  Administered 2011-12-13: 2 mg via ORAL
  Filled 2011-12-13: qty 1

## 2011-12-13 MED ORDER — GLUCOSE 40 % PO GEL
ORAL | Status: AC
  Administered 2011-12-13: 37.5 g
  Filled 2011-12-13: qty 1

## 2011-12-13 NOTE — Progress Notes (Signed)
Hypoglycemic Event  CBG:62  Treatment: 15 GM gel  Symptoms: None  Follow-up CBG: Time:1851 CBG Result:66  Possible Reasons for Event: Inadequate meal intake and Unknown  Comments/MD notified:DR. Dhungel    Orlando Penner  Remember to initiate Hypoglycemia Order Set & complete

## 2011-12-13 NOTE — Progress Notes (Signed)
Subjective: No complaints.   Objective Vital signs in last 24 hours: Filed Vitals:   12/12/11 1650 12/12/11 1725 12/12/11 2105 12/13/11 0505  BP: 126/84 125/80 116/69 131/73  Pulse: 68 72 58 59  Temp: 98.9 F (37.2 C) 98.6 F (37 C) 97.7 F (36.5 C) 98.5 F (36.9 C)  TempSrc: Oral Oral Oral Oral  Resp: 16 16 18 18   Height:      Weight:    72 kg (158 lb 11.7 oz)  SpO2:   100% 98%   Weight change: -1.2 kg (-2 lb 10.3 oz)  Intake/Output Summary (Last 24 hours) at 12/13/11 1238 Last data filed at 12/13/11 1218  Gross per 24 hour  Intake 554.58 ml  Output   3370 ml  Net -2815.42 ml   Labs: Basic Metabolic Panel:  Lab 12/13/11 7846 12/12/11 0610 12/11/11 0512 12/10/11 0645 12/09/11 1730 12/08/11 1757  NA 133* 131* 131* 130* 128* 122*  K 3.3* 3.0* 3.6 4.1 3.9 4.3  CL 110 109 108 105 100 94*  CO2 13* 11* 14* 17* 18* 17*  GLUCOSE 76 89 115* 131* 64* 77  BUN 25* 29* 32* 40* 37* 43*  CREATININE 2.67* 2.49* 2.39* 2.42* 2.38* 2.27*  ALB -- -- -- -- -- --  CALCIUM 7.0* 6.8* 7.2* 7.6* 7.7* 8.1*  PHOS -- -- 4.1 4.7* -- 2.7   Liver Function Tests:  Lab 12/11/11 0512 12/10/11 0645 12/09/11 1730  AST 24 22 26   ALT 22 20 23   ALKPHOS 126* 115 130*  BILITOT 0.5 0.8 1.2  PROT 7.7 7.5 8.4*  ALBUMIN 2.4* 2.4* 2.7*   No results found for this basename: LIPASE:3,AMYLASE:3 in the last 168 hours No results found for this basename: AMMONIA:3 in the last 168 hours CBC:  Lab 12/13/11 0600 12/12/11 0610 12/11/11 0512 12/10/11 0645 12/08/11 1757  WBC 4.0 3.6* 3.2* 3.6* --  NEUTROABS -- -- -- 2.0 2.4  HGB 8.8* 6.5* 7.5* 7.7* --  HCT 26.0* 19.7* 22.7* 22.9* --  MCV 82.5 83.5 82.8 82.4 --  PLT 73* 68* 75* 84* --   PT/INR: @labrcntip (inr:5) Cardiac Enzymes: No results found for this basename: CKTOTAL:5,CKMB:5,CKMBINDEX:5,TROPONINI:5 in the last 168 hours CBG:  Lab 12/13/11 0858 12/12/11 2102 12/12/11 1802 12/12/11 1704 12/12/11 1601  GLUCAP 76 73 66* 60* 55*    Iron Studies: No  results found for this basename: IRON:30,TIBC:30,TRANSFERRIN:30,FERRITIN:30 in the last 168 hours  Physical Exam:  Blood pressure 131/73, pulse 59, temperature 98.5 F (36.9 C), temperature source Oral, resp. rate 18, height 5\' 8"  (1.727 m), weight 72 kg (158 lb 11.7 oz), SpO2 98.00%.  Gen: alert, thin adult female, no distress  Skin: no rash, cyanosis  HEENT: EOMI, sclera anicteric, throat slightly dry  Neck: no JVD, loud deep R carotid bruit, no LAN  Chest: clear bilat, good air movement  Heart: regular, no rub or gallop, +2/6 SEM LUSB  Abdomen: soft, nontender, no HSM, no ascites, healed scars from abd surgery  Ext: no LE edema or UE edema  Neuro: alert, Ox3, no focal deficit   UA- 1.013, pH 7, 100 prot, 0-2wbc, TNTC rbc, few bact  UNa- 107  CXR- no acute findings  Renal US- Right kidney 12.9 cm, Left kidney 13.6 cm. +mild increased echogenicity bilat  Impression/Recommendations:  1. Renal failure, acute and/or chronic. Baseline creat was 0.5 last year, but increased to 1.1 from Dec 2012 to May 2013. AKI prerenal vs ATN due to sepsis. Creat mid 2's, good urine output, BP OK.  Urine sediment unremarkable, just lower tract RBC's, no casts. Less likely oxalosis or AIN due to intermittent cipro she takes for bact overgrowth. No weight increase despite IVF's prob due to large daily GI losses. Will double IVF's to 200 cc/hr, ask staff to record stool volume, bolus 1 L NS   2. Metabolic acidosis- due to renal failure vs diarrhea, check urine lytes 3. Fungal sepsis , cath-related. Cath is out, on IV antifungals.  4. 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 Chronic anemia- unclear cause 6. Hematuria- looks lower tract on microscopy, may need work up if doesn't resolve with treatment of acute illness 7. R carotid stenosis, moderate- asymptomatic   [name] Moselle  MD [facility] [phone] pgr    [phone]  cell 12/13/2011, 12:38 PM

## 2011-12-13 NOTE — Progress Notes (Signed)
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 Iohexol Itching    IVP Dye ok if taken with benadryl  . Penicillins Itching   Patient Measurements: Height: 5\' 8"  (172.7 cm) Weight: 158 lb 11.7 oz (72 kg) IBW/kg (Calculated) : 63.9   Vital Signs: Temp: 98.5 F (36.9 C) (08/03 0505) Temp src: Oral (08/03 0505) BP: 131/73 mmHg (08/03 0505) Pulse Rate: 59  (08/03 0505)  Labs:  Basename 12/13/11 0600 12/12/11 0610 12/11/11 0512  HGB 8.8* 6.5* --  HCT 26.0* 19.7* 22.7*  PLT 73* 68* 75*  APTT -- -- --  LABPROT 21.5* 25.2* 27.9*  INR 1.83* 2.24* 2.56*  HEPARINUNFRC -- -- --  CREATININE 2.67* 2.49* 2.39*  CKTOTAL -- -- --  CKMB -- -- --  TROPONINI -- -- --   Estimated Creatinine Clearance: 22.3 ml/min (by C-G formula based on Cr of 2.67).  Medications:  Prescriptions prior to admission  Medication Sig Dispense Refill  . ADULT TPN Inject 2,100 mLs into the vein continuous. Infuse over 10 hours with 1 hour ramp up and 1 hour ramp down. Total bag per day provides: 250g Dextrose 70%, 100g Travasol 10%, 45g Intralipids 30%, various electrolytes, MVI, TE, Zinc, selenium.      . calcium carbonate (TUMS EX) 750 MG chewable tablet Chew 2 tablets by mouth 3 (three) times daily. Calcium      . ciprofloxacin (CIPRO) 500 MG tablet TAKE 1 TABLET BY MOUTH TWICE DAILY FOR 1 WEEK AT A TIME INTERMITTENTLY AS DIRECTED FOR BACTERIAL OVERGROWTH  28 tablet  4  . diphenhydrAMINE (BENADRYL) 25 mg capsule Take 25 mg by mouth every 6 (six) hours as needed. For itching/sleep.      . Eflornithine HCl (VANIQA) 13.9 % cream Apply 1 application topically 2 (two) times daily with a meal. Applied to facial hair.      . Multiple Vitamin (MULTIVITAMIN WITH MINERALS) TABS Take 1 tablet by mouth daily.      Marland Kitchen omeprazole (PRILOSEC) 40 MG capsule Take 40  mg by mouth daily.      . Parenteral Electrolytes (TPN ELECTROLYTES IV) Inject 1 each into the vein at bedtime.       . promethazine (PHENERGAN) 25 MG tablet Take 25 mg by mouth every 6 (six) hours as needed. For nausea.      . ursodiol (ACTIGALL) 300 MG capsule TAKE 1 CAPSULE BY MOUTH TWICE DAILY  60 capsule  5  . Vitamin D, Ergocalciferol, (DRISDOL) 50000 UNITS CAPS ALTERNATE TAKING 1 CAPSULE BY MOUTH EVERY OTHER DAY AND 2 CAPSULES BY MOUTH EVERY OTHER DAY  45 capsule  1  . warfarin (COUMADIN) 10 MG tablet Take 1 tablet (10 mg total) by mouth daily.  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 Welton Flakes.  Such a narrow INR goal may be difficult in this patient with low gut absorption (therefore, low Vitamin K absorption).  Home dose reported as 10 mg daily.   INR >4 on admission. Warfarin restarted 7/30 after INR <2 after transfusions.  INR unstable, close to therapeutic range. Lovenox d/c'd 8/1. Home 10 mg dose appears too high.  INR decreased today, Warfarin held yesterday.  TNA held, PICC line removed for yeast  in blood. Plan to hold PICC replacement x 72 hr, replace if [facility] remain clear (rpt blood cx 8/3)  SCr increased, Cl ~75ml/min.  Pt received 2 units PRBCs 8/2, H/H improved  Goal of Therapy:  INR 2.5-3. Monitor platelets daily.   Plan:  Warfarin 2mg  today, goal INR 2.5-3  Daily PT/INR  Otho Bellows PharmD Pager [phone] 12/13/2011 10:54 AM

## 2011-12-13 NOTE — Progress Notes (Signed)
Hypoglycemic Event  CBG: 66  Treatment: D50 IV 25 mL  Symptoms: None  Follow-up CBG: Time:1930 CBG Result:87  Possible Reasons for Event: Inadequate meal intake and Unknown  Comments/MD notified:Dhungel    Braniya Farrugia, Jose Persia  Remember to initiate Hypoglycemia Order Set & complete

## 2011-12-13 NOTE — Progress Notes (Signed)
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 Patient came in to the hospital with acute anemia and dehydration.   Overnight issues:  Patient feels better after blood transfusion on 8/2 . No fever. Still has off and on diarrhea  Assessment/ plan:  pancytopenia  -Patient has history of chronic anemia secondary to chronic disease, she follows with Dr. Welton Flakes  -Patient hemoglobin usually between 8.5-9, she came in with hemoglobin of 7.2.  -Status post transfusion of 4 units since admission -She had recent 5 negative cards for FOBT on 11/03/2011. She has negative EGD/colonoscopy last year.  -appreciate GI eval. i discussed with her hematologist Dr Welton Flakes who feels her chronic pancytopenia is due to chronic underlying illness and given her anemia at baseline would not need bone marrow bx as inpatient and will plan as outpatient  -gets monthly arenesp injection.  -recheck h&H in am   Fever with candidal bacteremia  Patient had temp spike of 103 on 8/1 with both blood cx from 7/31 growing yeast  started on fluconazole which has been changed to micafungin by ID Dr. Luciana Axe, recommends 14-21 days of treatment, ( day 2) getting a TTE and ophthalmology evaluation as outpatient  -PICC line removed.  Pending repeat blood culture to evaluate for clearing and plan on placing another PICC line after that.  -2 D echo   shows mildly reduced LV function with EF 40-45% and akinesis of apical myocardium  Short gut syndrome  -Patient is on total parenteral nutrition secondary to short gut (reported 1 foot left of her small bowel).  -Patient mentioned of missing out on nutrients and MVs Due to shortage in the injectable multivitamins.  -holding off on TPN today  as PICC is removed . Continue IV fluids for now until another PICC placed in few .  Acute renal failure  initially thought likely secondary to dehydration. Renal USG unremarkable  -renal function not improved with IV hydration for past few days  -as per GI could possibly be associated with TPN as well.  -appreciate renal recs. Possible prerenal AKI vs ATN due to sepsis -low bicarb also noted. Ordered 2 amps bicarb with D5. Mildly improved today. Fluid rate increased to 200 cc/ hr today after a bolus given  -Monitor in am   Hyponatremia  - likely secondary to dehydration and acute renal failure.  -slowly improving with fluids . Monitor in am   Hypokalemia  Replenish potassium chloride in the fluids   Diarrhea  : chronic  improving slowly  with lomotil   Hematuria  -Patient has painless hematuria occasinally but stable    DVT prophylaxis  On coumadin   Code Status:full  Family Communication: daughter at bedside  Disposition Plan: home once stable   Consultants:  [facility] Procedures:  None Antibiotics:  None   HPI/Subjective:  Patient seen and examined this am. Feels better    Objective: Filed Vitals:   12/12/11 1650 12/12/11 1725 12/12/11 2105 12/13/11 0505  BP: 126/84 125/80 116/69 131/73  Pulse: 68 72 58 59  Temp: 98.9 F (37.2 C) 98.6 F (37 C) 97.7 F (36.5 C) 98.5 F (36.9 C)  TempSrc: Oral Oral Oral Oral  Resp: 16 16 18 18   Height:      Weight:    72 kg (158  lb 11.7 oz)  SpO2:   100% 98%    Intake/Output Summary (Last 24 hours) at 12/13/11 1447 Last data filed at 12/13/11 1316  Gross per 24 hour  Intake    240 ml  Output   3720 ml  Net  -3480 ml    Exam:  *General: Elderly female in NAD  HEENT: no pallor, dry oral mucosa  CVS: N S1&S2, no murmurs  Respiratory: clear b/l no added sounds  Abdomen: old laprotomy scar , soft, NT, ND, BS+ CNS: AAO X 3 , non focal Ext: Warm, no edema  Cns: aaox3   Data Reviewed: Basic Metabolic  Panel:  Lab 12/13/11 0600 12/12/11 0610 12/11/11 0512 12/10/11 0645 12/09/11 1730 12/08/11 1757  NA 133* 131* 131* 130* 128* --  K 3.3* 3.0* 3.6 4.1 3.9 --  CL 110 109 108 105 100 --  CO2 13* 11* 14* 17* 18* --  GLUCOSE 76 89 115* 131* 64* --  BUN 25* 29* 32* 40* 37* --  CREATININE 2.67* 2.49* 2.39* 2.42* 2.38* --  CALCIUM 7.0* 6.8* 7.2* 7.6* 7.7* --  MG -- -- 1.8 2.1 -- 1.7  PHOS -- -- 4.1 4.7* -- 2.7   Liver Function Tests:  Lab 12/11/11 0512 12/10/11 0645 12/09/11 1730 12/08/11 1757  AST 24 22 26 26   ALT 22 20 23 24   ALKPHOS 126* 115 130* 140*  BILITOT 0.5 0.8 1.2 0.6  PROT 7.7 7.5 8.4* 8.1  ALBUMIN 2.4* 2.4* 2.7* 2.6*   No results found for this basename: LIPASE:5,AMYLASE:5 in the last 168 hours No results found for this basename: AMMONIA:5 in the last 168 hours CBC:  Lab 12/13/11 0600 12/12/11 0610 12/11/11 0512 12/10/11 0645 12/09/11 1730 12/08/11 1757  WBC 4.0 3.6* 3.2* 3.6* 3.9* --  NEUTROABS -- -- -- 2.0 -- 2.4  HGB 8.8* 6.5* 7.5* 7.7* 8.0* --  HCT 26.0* 19.7* 22.7* 22.9* 24.2* --  MCV 82.5 83.5 82.8 82.4 81.5 --  PLT 73* 68* 75* 84* 77* --   Cardiac Enzymes: No results found for this basename: CKTOTAL:5,CKMB:5,CKMBINDEX:5,TROPONINI:5 in the last 168 hours BNP (last 3 results) No results found for this basename: PROBNP:3 in the last 8760 hours CBG:  Lab 12/13/11 1215 12/13/11 0858 12/12/11 2102 12/12/11 1802 12/12/11 1704  GLUCAP 80 76 73 66* 60*    Recent Results (from the past 240 hour(s))  CULTURE, BLOOD (ROUTINE X 2)     Status: Normal (Preliminary result)   Collection Time   12/09/11  2:15 PM      Component Value Range Status Comment   Specimen Description BLOOD RIGHT ARM   Final    Special Requests BOTTLES DRAWN AEROBIC AND ANAEROBIC 5CC   Final    Culture  Setup Time 12/10/2011 00:49   Final    Culture     Final    Value: YEAST     Note: Gram Stain Report Called to,Read Back By and Verified With: LORI HARRIS @0330  ON 12/12/2011 BY MCLET   Report  Status PENDING   Incomplete   CULTURE, BLOOD (ROUTINE X 2)     Status: Normal (Preliminary result)   Collection Time   12/09/11  2:25 PM      Component Value Range Status Comment   Specimen Description Blood   Final    Special Requests Normal   Final    Culture  Setup Time 12/10/2011 00:25   Final    Culture     Final    Value: YEAST  Note: Gram Stain Report Called to,Read Back By and Verified With: LAURIE HARRIS @ 1923 ON 12/11/11 BY GOLLD   Report Status PENDING   Incomplete      Studies: Dg Chest 2 View  12/09/2011  *RADIOLOGY REPORT*  Clinical Data: Fever and weakness.  Ex-smoker.  CHEST - 2 VIEW  Comparison: CT of 10/24/2011 and plain film of 01/17/2011.  Findings: A left-sided PICC line which is unchanged and terminates at the low SVC. Midline trachea.  Borderline cardiomegaly.  Age advanced aortic atherosclerosis. No pleural effusion or pneumothorax.  The reticular nodular opacities described on the prior plain film are improved to resolved.  Mild interstitial prominence remains, and is likely related to the clinical history of  prior smoking. No lobar consolidation.  IMPRESSION: Interstitial thickening, likely related to prior smoking / chronic bronchitis.  No evidence of acute superimposed pneumonia.  Original Report Authenticated By: Consuello Bossier, M.D.   US Renal  12/09/2011  *RADIOLOGY REPORT*  Clinical Data: Hematuria.  History of diabetes.  RENAL/URINARY TRACT ULTRASOUND COMPLETE  Comparison:  Abdominal ultrasound 10/29/2011.  Findings:  Right Kidney:  Mild diffuse increased echogenicity of the renal parenchyma is nonspecific.  No focal cystic or solid renal lesions. 12.9 cm in length.  No hydronephrosis. No definite calculi.  Left Kidney:  Mild diffuse increased echogenicity of the renal parenchyma is nonspecific.  No focal cystic or solid renal lesions. 13.6 cm in length.  No hydronephrosis. No definite calculi.  Bladder:  Well distended without focal wall abnormalities.   IMPRESSION: 1.  Mild diffusely increased echogenicity of the the renal parenchyma bilaterally is nonspecific, but could suggest underlying medical renal disease. 2.  No focal cystic or solid renal lesions, and no definite evidence of calculi within the collecting systems.  Original Report Authenticated By: Florencia Reasons, M.D.    Scheduled Meds:   . B-complex with vitamin C  1 tablet Oral Daily  . diphenoxylate-atropine  1 tablet Oral TID  . feeding supplement  237 mL Oral Daily  . feeding supplement  1 Container Oral Q1400  . micafungin (MYCAMINE) IV  100 mg Intravenous QPC supper  . multivitamin with minerals  1 tablet Oral Daily  . pantoprazole  40 mg Oral Q1200  . sodium chloride  1,000 mL Intravenous Once  . warfarin  2 mg Oral ONCE-1800  . Warfarin - Pharmacist Dosing Inpatient   Does not apply q1800   Continuous Infusions:   .  sodium bicarbonate infusion sterile water 1000 mL    . TPN (CLINIMIX) +/- additives 50 mL/hr at 12/12/11 0500  . DISCONTD: dextrose 5 % 1,000 mL with potassium chloride 40 mEq, sodium bicarbonate 100 mEq infusion 100 mL/hr at 12/13/11 0452  . DISCONTD: fat emulsion    . DISCONTD: TPN (CLINIMIX) +/- additives        Time spent: 30 minutes    Khrystyna Schwalm  [facility] Pager [phone]. If 8PM-8AM, please contact night-coverage at [email or web address], password [email or web address] 12/13/2011, 2:47 PM  LOS: 5 days

## 2011-12-13 NOTE — Progress Notes (Signed)
Hypoglycemic Event  CBG: 63  Treatment: 15 GM carbohydrate snack  Symptoms: None  Follow-up CBG: Time:1747 CBG Result: 63  Possible Reasons for Event: Inadequate meal intake  Comments/MD notified: Dr. Albin Fischer, Jose Persia  Remember to initiate Hypoglycemia Order Set & complete

## 2011-12-13 NOTE — Progress Notes (Signed)
[facility] Progress Note   Since last GI note: Yeast + in blood (times 2), PICC line pulled.  Cr continues to rise.   Feeling overall better today than yesterday.  Objective: Vital signs in last 24 hours: Temp:  [97.7 F (36.5 C)-98.9 F (37.2 C)] 98.5 F (36.9 C) (08/03 0505) Pulse Rate:  [58-72] 59  (08/03 0505) Resp:  [16-20] 18  (08/03 0505) BP: (115-131)/(68-84) 131/73 mmHg (08/03 0505) SpO2:  [98 %-100 %] 98 % (08/03 0505) Weight:  [158 lb 11.7 oz (72 kg)] 158 lb 11.7 oz (72 kg) (08/03 0505) Last BM Date: 12/12/11 General: alert and oriented times 3 Heart: regular rate and rythm Abdomen: soft, non-tender, non-distended, normal bowel sounds   Lab Results:  Basename 12/13/11 0600 12/12/11 0610 12/11/11 0512  WBC 4.0 3.6* 3.2*  HGB 8.8* 6.5* 7.5*  PLT 73* 68* 75*  MCV 82.5 83.5 82.8    Basename 12/13/11 0600 12/12/11 0610 12/11/11 0512  NA 133* 131* 131*  K 3.3* 3.0* 3.6  CL 110 109 108  CO2 13* 11* 14*  GLUCOSE 76 89 115*  BUN 25* 29* 32*  CREATININE 2.67* 2.49* 2.39*  CALCIUM 7.0* 6.8* 7.2*    Basename 12/11/11 0512  PROT 7.7  ALBUMIN 2.4*  AST 24  ALT 22  ALKPHOS 126*  BILITOT 0.5  BILIDIR --  IBILI --    Basename 12/13/11 0600 12/12/11 0610  INR 1.83* 2.24*     Studies/Results: No results found.   Medications: Scheduled Meds:   . B-complex with vitamin C  1 tablet Oral Daily  . diphenoxylate-atropine  1 tablet Oral TID  . feeding supplement  237 mL Oral Daily  . feeding supplement  1 Container Oral Q1400  . micafungin (MYCAMINE) IV  100 mg Intravenous QPC supper  . multivitamin with minerals  1 tablet Oral Daily  . pantoprazole  40 mg Oral Q1200  . Warfarin - Pharmacist Dosing Inpatient   Does not apply q1800  . DISCONTD: fluconazole (DIFLUCAN) IV  200 mg Intravenous QHS  . DISCONTD: micafungin (MYCAMINE) IV  100 mg Intravenous Daily   Continuous Infusions:   . dextrose 5 % 1,000 mL with potassium chloride 40 mEq,  sodium bicarbonate 100 mEq infusion 100 mL/hr at 12/13/11 0452  . TPN (CLINIMIX) +/- additives 50 mL/hr at 12/12/11 0500  . DISCONTD: sodium chloride 250 mL/hr at 12/11/11 1811  . DISCONTD: fat emulsion    . DISCONTD: TPN (CLINIMIX) +/- additives     PRN Meds:.acetaminophen, diphenhydrAMINE, morphine injection, ondansetron (ZOFRAN) IV, ondansetron, promethazine, sodium chloride    Assessment/Plan: 61 y.o. female short bowel, yeast in blood, ARF  Appreciate renal, med help.  For her nutrition I've advised she eat everything she can and hopefully a deep IV for TNA can be replaced relatively soon.  She will probably digest some, absorb some nutrients from PO intake and IV fluids will keep her from getting dehydrated.   Rob Bunting, MD  12/13/2011, 9:53 AM  Gastroenterology Pager [phone]

## 2011-12-14 DIAGNOSIS — E876 Hypokalemia: Secondary | ICD-10-CM | POA: Diagnosis not present

## 2011-12-14 LAB — BASIC METABOLIC PANEL
BUN: 18 mg/dL (ref 6–23)
CO2: 21 mEq/L (ref 19–32)
Chloride: 104 mEq/L (ref 96–112)
Creatinine, Ser: 2.52 mg/dL — ABNORMAL HIGH (ref 0.50–1.10)
GFR calc Af Amer: 23 mL/min — ABNORMAL LOW (ref >90)
Potassium: 2.7 mEq/L — CL (ref 3.5–5.1)

## 2011-12-14 LAB — CULTURE, BLOOD (ROUTINE X 2): Special Requests: NORMAL

## 2011-12-14 LAB — GLUCOSE, CAPILLARY
Glucose-Capillary: 73 mg/dL (ref 70–99)
Glucose-Capillary: 88 mg/dL (ref 70–99)
Glucose-Capillary: 91 mg/dL (ref 70–99)

## 2011-12-14 LAB — CBC
HCT: 24 % — ABNORMAL LOW (ref 36.0–46.0)
Hemoglobin: 8.3 g/dL — ABNORMAL LOW (ref 12.0–15.0)
MCH: 28.3 pg (ref 26.0–34.0)
MCHC: 34.6 g/dL (ref 30.0–36.0)
RBC: 2.93 MIL/uL — ABNORMAL LOW (ref 3.87–5.11)

## 2011-12-14 LAB — PROTIME-INR
INR: 1.77 — ABNORMAL HIGH (ref 0.00–1.49)
Prothrombin Time: 20.9 seconds — ABNORMAL HIGH (ref 11.6–15.2)

## 2011-12-14 MED ORDER — GLUCOSE-VITAMIN C 4-6 GM-MG PO CHEW
CHEWABLE_TABLET | ORAL | Status: AC
  Administered 2011-12-14: 4 g
  Filled 2011-12-14: qty 1

## 2011-12-14 MED ORDER — CALCIUM GLUCONATE 10 % IV SOLN
1.0000 g | Freq: Once | INTRAVENOUS | Status: AC
  Administered 2011-12-14: 1 g via INTRAVENOUS
  Filled 2011-12-14: qty 10

## 2011-12-14 MED ORDER — SODIUM CHLORIDE 0.9 % IV BOLUS (SEPSIS)
1000.0000 mL | Freq: Once | INTRAVENOUS | Status: AC
  Administered 2011-12-14: 1000 mL via INTRAVENOUS

## 2011-12-14 MED ORDER — POTASSIUM CHLORIDE 20 MEQ/15ML (10%) PO LIQD
40.0000 meq | Freq: Three times a day (TID) | ORAL | Status: AC
  Administered 2011-12-14 (×2): 40 meq via ORAL
  Filled 2011-12-14 (×2): qty 30

## 2011-12-14 MED ORDER — WARFARIN SODIUM 4 MG PO TABS
4.0000 mg | ORAL_TABLET | Freq: Once | ORAL | Status: AC
  Administered 2011-12-14: 4 mg via ORAL
  Filled 2011-12-14: qty 1

## 2011-12-14 MED ORDER — SODIUM BICARBONATE 8.4 % IV SOLN
INTRAVENOUS | Status: DC
  Administered 2011-12-14 – 2011-12-15 (×5): via INTRAVENOUS
  Filled 2011-12-14 (×13): qty 1000

## 2011-12-14 MED ORDER — POTASSIUM CHLORIDE 10 MEQ/100ML IV SOLN
10.0000 meq | INTRAVENOUS | Status: AC
  Administered 2011-12-14 (×4): 10 meq via INTRAVENOUS
  Filled 2011-12-14 (×4): qty 100

## 2011-12-14 NOTE — Progress Notes (Addendum)
Hypoglycemic Event  CBG:66   Treatment: 3 glucose tabs  Symptoms: None  Follow-up CBG: Time:1751 CBG Result:88  Possible Reasons for Event: Inadequate meal intake and Unknown  Comments/MD notified:Dr. Dhungel    [name] [name], [name] [name]  Remember to initiate Hypoglycemia Order Set & complete

## 2011-12-14 NOTE — Progress Notes (Addendum)
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 Patient came in to the hospital with acute anemia and dehydration. Hospital course prolonged due to AKI and  candida bacteremia.    Assessment/ plan:  pancytopenia  -Patient has history of chronic anemia secondary to chronic disease, she follows with Dr. Welton Flakes  -Patient hemoglobin usually between 8.5-9, she came in with hemoglobin of 7.2.  -Status post transfusion of 4 units since admission  -She had recent 5 negative cards for FOBT on 11/03/2011. She has negative EGD/colonoscopy last year.  -appreciate GI eval. i discussed with her hematologist Dr Welton Flakes who feels her chronic pancytopenia is due to chronic underlying illness and given her anemia at baseline would not need bone marrow bx as inpatient and will plan as outpatient  -gets monthly arenesp injection.    Fever with candidal bacteremia  Patient had temp spike of 103 on 8/1 with both blood cx from 7/31 growing yeast  started on fluconazole which has been changed to micafungin by ID Dr. Luciana Axe, recommends 14 days of treatment, ( day 3) getting a TTE and ophthalmology evaluation as outpatient  -PICC line removed.  Pending repeat blood culture to evaluate for clearing and plan on placing another PICC line after that.  -2 D echo shows mildly reduced LV function with EF 40-45% and akinesis of apical myocardium . Patient currently is volume depleted.   Short gut syndrome  -Patient is on total parenteral nutrition secondary to short gut (reported 1 foot left of her small bowel).  -Patient mentioned of missing out on nutrients and MVs Due to shortage in the injectable multivitamins.  -holding off on TPN today as PICC is removed . Continue IV  fluids for now until another PICC placed if blood cx negative ( likely tomorrow)   Acute renal failure  initially thought likely secondary to dehydration. Renal USG unremarkable  -renal function not improved with IV hydration for past few days  -as per GI could possibly be associated with TPN as well.  -appreciate renal recs. Possible prerenal AKI vs ATN due to sepsis  -low bicarb also noted. Given aggressive hydration with 3 amps bicarb on 8/3 fluid increased to 250 cc/ hr ( D5 with bicarb  AFTER 1 L iv NS bolus given)  Acidosis improving. Urine output quite good -Monitor in am   Hyponatremia  - likely secondary to dehydration and acute renal failure.  -slowly improving with fluids . Monitor in am   Hypokalemia  Noted for k of 2.7 today. ordered 4 runs of kcl. Check in am   Diarrhea  : chronic  on lomotil   Hematuria  -Patient has painless hematuria occasinally but stable  -needs w/up if persistent  DVT prophylaxis  On coumadin per pharmacy  Code Status:full  Family Communication: daughter at bedside  Disposition Plan: home once stable    Consultants:  [facility]  [facility] ( renal)  [facility] ( ID)  Procedures:  none  Antibiotics:  On IV micofungin for 2 weeks ( started on 8/2)  HPI/Subjective: Feels better overall. still has  diarrhea  Objective: Filed Vitals:   12/13/11 2215 12/13/11 2358 12/14/11 0557 12/14/11 1326  BP: 108/56  115/64 130/82  Pulse: 66  61 57  Temp: 102.1 F (38.9 C) 100.4 F (38  C) 98.3 F (36.8 C) 98.8 F (37.1 C)  TempSrc: Oral  Oral Oral  Resp: 18  16 14   Height:      Weight:   73.1 kg (161 lb 2.5 oz)   SpO2: 96%  96% 99%    Intake/Output Summary (Last 24 hours) at 12/14/11 1441 Last data filed at 12/14/11 1300  Gross per 24 hour  Intake 6581.66 ml  Output   4950 ml  Net 1631.66 ml    Exam:  General: Elderly female in NAD  HEENT: no pallor, dry oral mucosa  CVS: N S1&S2, no murmurs  Respiratory: clear b/l no  added sounds  Abdomen: old laprotomy scar , soft, NT, ND, BS+ CNS: AAO X 3 , non focal  Ext: Warm, no edema  Cns: aaox3   Data Reviewed: Basic Metabolic Panel:  Lab 12/14/11 1610 12/13/11 0600 12/12/11 0610 12/11/11 0512 12/10/11 0645 12/08/11 1757  NA 134* 133* 131* 131* 130* --  K 2.7* 3.3* 3.0* 3.6 4.1 --  CL 104 110 109 108 105 --  CO2 21 13* 11* 14* 17* --  GLUCOSE 71 76 89 115* 131* --  BUN 18 25* 29* 32* 40* --  CREATININE 2.52* 2.67* 2.49* 2.39* 2.42* --  CALCIUM 6.3* 7.0* 6.8* 7.2* 7.6* --  MG -- -- -- 1.8 2.1 1.7  PHOS -- -- -- 4.1 4.7* 2.7   Liver Function Tests:  Lab 12/11/11 0512 12/10/11 0645 12/09/11 1730 12/08/11 1757  AST 24 22 26 26   ALT 22 20 23 24   ALKPHOS 126* 115 130* 140*  BILITOT 0.5 0.8 1.2 0.6  PROT 7.7 7.5 8.4* 8.1  ALBUMIN 2.4* 2.4* 2.7* 2.6*   No results found for this basename: LIPASE:5,AMYLASE:5 in the last 168 hours No results found for this basename: AMMONIA:5 in the last 168 hours CBC:  Lab 12/14/11 0432 12/13/11 0600 12/12/11 0610 12/11/11 0512 12/10/11 0645 12/08/11 1757  WBC 4.0 4.0 3.6* 3.2* 3.6* --  NEUTROABS -- -- -- -- 2.0 2.4  HGB 8.3* 8.8* 6.5* 7.5* 7.7* --  HCT 24.0* 26.0* 19.7* 22.7* 22.9* --  MCV 81.9 82.5 83.5 82.8 82.4 --  PLT 66* 73* 68* 75* 84* --   Cardiac Enzymes: No results found for this basename: CKTOTAL:5,CKMB:5,CKMBINDEX:5,TROPONINI:5 in the last 168 hours BNP (last 3 results) No results found for this basename: PROBNP:3 in the last 8760 hours CBG:  Lab 12/14/11 1217 12/14/11 0822 12/14/11 0552 12/13/11 2358 12/13/11 1942  GLUCAP 70 84 74 73 87    Recent Results (from the past 240 hour(s))  CULTURE, BLOOD (ROUTINE X 2)     Status: Normal (Preliminary result)   Collection Time   12/09/11  2:15 PM      Component Value Range Status Comment   Specimen Description BLOOD RIGHT ARM   Final    Special Requests BOTTLES DRAWN AEROBIC AND ANAEROBIC 5CC   Final    Culture  Setup Time 12/10/2011 00:49   Final     Culture     Final    Value: YEAST     Note: Gram Stain Report Called to,Read Back By and Verified With: LORI HARRIS @0330  ON 12/12/2011 BY MCLET   Report Status PENDING   Incomplete   CULTURE, BLOOD (ROUTINE X 2)     Status: Normal (Preliminary result)   Collection Time   12/09/11  2:25 PM      Component Value Range Status Comment   Specimen Description Blood   Final  Special Requests Normal   Final    Culture  Setup Time 12/10/2011 00:25   Final    Culture     Final    Value: YEAST     Note: Gram Stain Report Called to,Read Back By and Verified With: LAURIE HARRIS @ 1923 ON 12/11/11 BY GOLLD   Report Status PENDING   Incomplete   CULTURE, BLOOD (ROUTINE X 2)     Status: Normal (Preliminary result)   Collection Time   12/13/11  5:50 AM      Component Value Range Status Comment   Specimen Description BLOOD LEFT HAND   Final    Special Requests BOTTLES DRAWN AEROBIC ONLY 5 CC   Final    Culture  Setup Time 12/13/2011 11:13   Final    Culture     Final    Value:        BLOOD CULTURE RECEIVED NO GROWTH TO DATE CULTURE WILL BE HELD FOR 5 DAYS BEFORE ISSUING A FINAL NEGATIVE REPORT   Report Status PENDING   Incomplete   CULTURE, BLOOD (ROUTINE X 2)     Status: Normal (Preliminary result)   Collection Time   12/13/11  6:00 AM      Component Value Range Status Comment   Specimen Description BLOOD LEFT HAND   Final    Special Requests BOTTLES DRAWN AEROBIC AND ANAEROBIC 5 CC EACH   Final    Culture  Setup Time 12/13/2011 11:13   Final    Culture     Final    Value:        BLOOD CULTURE RECEIVED NO GROWTH TO DATE CULTURE WILL BE HELD FOR 5 DAYS BEFORE ISSUING A FINAL NEGATIVE REPORT   Report Status PENDING   Incomplete      Studies: Dg Chest 2 View  12/09/2011  *RADIOLOGY REPORT*  Clinical Data: Fever and weakness.  Ex-smoker.  CHEST - 2 VIEW  Comparison: CT of 10/24/2011 and plain film of 01/17/2011.  Findings: A left-sided PICC line which is unchanged and terminates at the low SVC. Midline  trachea.  Borderline cardiomegaly.  Age advanced aortic atherosclerosis. No pleural effusion or pneumothorax.  The reticular nodular opacities described on the prior plain film are improved to resolved.  Mild interstitial prominence remains, and is likely related to the clinical history of  prior smoking. No lobar consolidation.  IMPRESSION: Interstitial thickening, likely related to prior smoking / chronic bronchitis.  No evidence of acute superimposed pneumonia.  Original Report Authenticated By: Consuello Bossier, M.D.   US Renal  12/09/2011  *RADIOLOGY REPORT*  Clinical Data: Hematuria.  History of diabetes.  RENAL/URINARY TRACT ULTRASOUND COMPLETE  Comparison:  Abdominal ultrasound 10/29/2011.  Findings:  Right Kidney:  Mild diffuse increased echogenicity of the renal parenchyma is nonspecific.  No focal cystic or solid renal lesions. 12.9 cm in length.  No hydronephrosis. No definite calculi.  Left Kidney:  Mild diffuse increased echogenicity of the renal parenchyma is nonspecific.  No focal cystic or solid renal lesions. 13.6 cm in length.  No hydronephrosis. No definite calculi.  Bladder:  Well distended without focal wall abnormalities.  IMPRESSION: 1.  Mild diffusely increased echogenicity of the the renal parenchyma bilaterally is nonspecific, but could suggest underlying medical renal disease. 2.  No focal cystic or solid renal lesions, and no definite evidence of calculi within the collecting systems.  Original Report Authenticated By: Florencia Reasons, M.D.    Scheduled Meds:   . B-complex with vitamin  C  1 tablet Oral Daily  . calcium gluconate  1 g Intravenous Once  . dextrose      . dextrose      . diphenoxylate-atropine  1 tablet Oral TID  . feeding supplement  237 mL Oral Daily  . feeding supplement  1 Container Oral Q1400  . micafungin (MYCAMINE) IV  100 mg Intravenous QPC supper  . multivitamin with minerals  1 tablet Oral Daily  . pantoprazole  40 mg Oral Q1200  . potassium  chloride  10 mEq Intravenous Q1 Hr x 4  . potassium chloride  40 mEq Oral Q8H  . sodium chloride  1,000 mL Intravenous Once  . sodium chloride  1,000 mL Intravenous Once  . sodium chloride  1,000 mL Intravenous Once  . warfarin  2 mg Oral ONCE-1800  . warfarin  4 mg Oral Once  . Warfarin - Pharmacist Dosing Inpatient   Does not apply q1800   Continuous Infusions:   . dextrose 5 % 1,000 mL with sodium bicarbonate 150 mEq infusion 250 mL/hr at 12/14/11 1254  . DISCONTD:  sodium bicarbonate infusion sterile water 1000 mL 200 mL/hr at 12/14/11 0651      Time spent: 30 minutes    Jessina Marse  [facility] Pager [phone] If 8PM-8AM, please contact night-coverage at [email or web address], password [facility] 12/14/2011, 2:41 PM  LOS: 6 days

## 2011-12-14 NOTE — Progress Notes (Signed)
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 Iohexol Itching    IVP Dye ok if taken with benadryl  . Penicillins Itching   Patient Measurements: Height: 5\' 8"  (172.7 cm) Weight: 161 lb 2.5 oz (73.1 kg) IBW/kg (Calculated) : 63.9   Vital Signs: Temp: 98.3 F (36.8 C) (08/04 0557) Temp src: Oral (08/04 0557) BP: 115/64 mmHg (08/04 0557) Pulse Rate: 61  (08/04 0557)  Labs:  Basename 12/14/11 0432 12/13/11 0600 12/12/11 0610  HGB 8.3* 8.8* --  HCT 24.0* 26.0* 19.7*  PLT 66* 73* 68*  APTT -- -- --  LABPROT 20.9* 21.5* 25.2*  INR 1.77* 1.83* 2.24*  HEPARINUNFRC -- -- --  CREATININE 2.52* 2.67* 2.49*  CKTOTAL -- -- --  CKMB -- -- --  TROPONINI -- -- --   Estimated Creatinine Clearance: 23.6 ml/min (by C-G formula based on Cr of 2.52).  Medications:  Prescriptions prior to admission  Medication Sig Dispense Refill  . ADULT TPN Inject 2,100 mLs into the vein continuous. Infuse over 10 hours with 1 hour ramp up and 1 hour ramp down. Total bag per day provides: 250g Dextrose 70%, 100g Travasol 10%, 45g Intralipids 30%, various electrolytes, MVI, TE, Zinc, selenium.      . calcium carbonate (TUMS EX) 750 MG chewable tablet Chew 2 tablets by mouth 3 (three) times daily. Calcium      . ciprofloxacin (CIPRO) 500 MG tablet TAKE 1 TABLET BY MOUTH TWICE DAILY FOR 1 WEEK AT A TIME INTERMITTENTLY AS DIRECTED FOR BACTERIAL OVERGROWTH  28 tablet  4  . diphenhydrAMINE (BENADRYL) 25 mg capsule Take 25 mg by mouth every 6 (six) hours as needed. For itching/sleep.      . Eflornithine HCl (VANIQA) 13.9 % cream Apply 1 application topically 2 (two) times daily with a meal. Applied to facial hair.      . Multiple Vitamin (MULTIVITAMIN WITH MINERALS) TABS Take 1 tablet by mouth daily.      Marland Kitchen omeprazole (PRILOSEC) 40 MG capsule Take 40  mg by mouth daily.      . Parenteral Electrolytes (TPN ELECTROLYTES IV) Inject 1 each into the vein at bedtime.       . promethazine (PHENERGAN) 25 MG tablet Take 25 mg by mouth every 6 (six) hours as needed. For nausea.      . ursodiol (ACTIGALL) 300 MG capsule TAKE 1 CAPSULE BY MOUTH TWICE DAILY  60 capsule  5  . Vitamin D, Ergocalciferol, (DRISDOL) 50000 UNITS CAPS ALTERNATE TAKING 1 CAPSULE BY MOUTH EVERY OTHER DAY AND 2 CAPSULES BY MOUTH EVERY OTHER DAY  45 capsule  1  . warfarin (COUMADIN) 10 MG tablet Take 1 tablet (10 mg total) by mouth daily.  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 Welton Flakes.   Home dose reported as Warfarin 10 mg daily.   INR >4 on admission. Warfarin restarted 7/30 after INR <2 after transfusions. Lovenox d/c'd 8/1. Home 10 mg dose appears too high.  INR below therapeutic range today, Warfarin held 8/2, 2mg  Warfarin 8/3  TNA held, PICC line removed for yeast in blood. Plan to hold PICC replacement x 72 hr, replace if [facility] remain clear (rpt blood cx 8/3)  Difficult  to dose Warfarin with nutrition held, attempting therapeutic INR without overshoot.  SCr increased, Cl < 65ml/min.  Pt received 2 units PRBCs 8/2, H/H improved  Goal of Therapy:  INR 2.5-3. Monitor platelets daily.   Plan:  Warfarin 4 mg today, goal INR 2.5-3  Daily PT/INR  Otho Bellows PharmD Pager [phone] 12/14/2011 9:11 AM

## 2011-12-14 NOTE — Progress Notes (Signed)
CRITICAL VALUE ALERT  Critical value received:  Potassium 2.7, Calcium 6.3  Date of notification:  12/14/2011  Time of notification:  0555  Critical value read back:yes  Nurse who received alert:  L. Vergel de Lucy Chris RN  MD notified (1st page):  Lenny Pastel NP  Time of first page:  0600  MD notified (2nd page):  Time of second page:  Responding MD:  Lenny Pastel NP  Time MD responded:  [phone]

## 2011-12-14 NOTE — Progress Notes (Signed)
Subjective: Feeling somewhat better.  Got 6L in yesterday and 5650 out (4100 UOP and 1550 stool).   Objective Vital signs in last 24 hours: Filed Vitals:   12/13/11 1500 12/13/11 2215 12/13/11 2358 12/14/11 0557  BP: 124/74 108/56  115/64  Pulse: 61 66  61  Temp: 99.4 F (37.4 C) 102.1 F (38.9 C) 100.4 F (38 C) 98.3 F (36.8 C)  TempSrc: Oral Oral  Oral  Resp: 16 18  16   Height:      Weight:    73.1 kg (161 lb 2.5 oz)  SpO2: 100% 96%  96%   Weight change: 1.1 kg (2 lb 6.8 oz)  Intake/Output Summary (Last 24 hours) at 12/14/11 1206 Last data filed at 12/14/11 1049  Gross per 24 hour  Intake 6341.66 ml  Output   5850 ml  Net 491.66 ml   Labs: Basic Metabolic Panel:  Lab 12/14/11 1610 12/13/11 0600 12/12/11 0610 12/11/11 0512 12/10/11 0645 12/09/11 1730 12/08/11 1757  NA 134* 133* 131* 131* 130* 128* 122*  K 2.7* 3.3* 3.0* 3.6 4.1 3.9 4.3  CL 104 110 109 108 105 100 94*  CO2 21 13* 11* 14* 17* 18* 17*  GLUCOSE 71 76 89 115* 131* 64* 77  BUN 18 25* 29* 32* 40* 37* 43*  CREATININE 2.52* 2.67* 2.49* 2.39* 2.42* 2.38* 2.27*  ALB -- -- -- -- -- -- --  CALCIUM 6.3* 7.0* 6.8* 7.2* 7.6* 7.7* 8.1*  PHOS -- -- -- 4.1 4.7* -- 2.7   Liver Function Tests:  Lab 12/11/11 0512 12/10/11 0645 12/09/11 1730  AST 24 22 26   ALT 22 20 23   ALKPHOS 126* 115 130*  BILITOT 0.5 0.8 1.2  PROT 7.7 7.5 8.4*  ALBUMIN 2.4* 2.4* 2.7*   No results found for this basename: LIPASE:3,AMYLASE:3 in the last 168 hours No results found for this basename: AMMONIA:3 in the last 168 hours CBC:  Lab 12/14/11 0432 12/13/11 0600 12/12/11 0610 12/11/11 0512 12/10/11 0645 12/08/11 1757  WBC 4.0 4.0 3.6* 3.2* -- --  NEUTROABS -- -- -- -- 2.0 2.4  HGB 8.3* 8.8* 6.5* 7.5* -- --  HCT 24.0* 26.0* 19.7* 22.7* -- --  MCV 81.9 82.5 83.5 82.8 -- --  PLT 66* 73* 68* 75* -- --   PT/INR: @labrcntip (inr:5) Cardiac Enzymes: No results found for this basename: CKTOTAL:5,CKMB:5,CKMBINDEX:5,TROPONINI:5 in the last  168 hours CBG:  Lab 12/14/11 0552 12/13/11 2358 12/13/11 1942 12/13/11 1851 12/13/11 1820  GLUCAP 74 73 87 66* 64*    Iron Studies: No results found for this basename: IRON:30,TIBC:30,TRANSFERRIN:30,FERRITIN:30 in the last 168 hours  Physical Exam:  Blood pressure 115/64, pulse 61, temperature 98.3 F (36.8 C), temperature source Oral, resp. rate 16, height 5\' 8"  (1.727 m), weight 73.1 kg (161 lb 2.5 oz), SpO2 96.00%.  Gen: alert, thin adult female, no distress  Skin: no rash, cyanosis  HEENT: EOMI, sclera anicteric, throat slightly dry  Neck: no JVD, loud deep R carotid bruit, no LAN  Chest: clear bilat, good air movement  Heart: regular, no rub or gallop, +2/6 SEM LUSB  Abdomen: soft, nontender, no HSM, no ascites, healed scars from abd surgery  Ext: no LE edema or UE edema  Neuro: alert, Ox3, no focal deficit   UA- 1.013, pH 7, 100 prot, 0-2wbc, TNTC rbc, few bact  UNa- 107  CXR- no acute findings  Renal US- Right kidney 12.9 cm, Left kidney 13.6 cm. +mild increased echogenicity bilat  Impression/Recommendations:  1. Renal failure,  acute and/or chronic. Baseline creat was 0.5 last year, but increased to 1.1 from Dec 2012 to May 2013. AKI prerenal vs ATN due to sepsis. Creat mid 2's, good urine output, BP OK. Urine sediment unremarkable w lower tract RBC's, no oxalate crystals and no casts. Less likely oxalosis or AIN due to intermittent cipro she takes for bact overgrowth. No weight gain despite IVF's prob due to large daily GI losses. Have increased IVF's to 250 cc/hr, creat down slightly and feeling better.  2. Metabolic acidosis- due to renal failure vs diarrhea, check urine lytes. Better today.  3. Fungal bacteremia, PICC-related. Cath is out, on IV antifungals.  4. 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 Chronic anemia- unclear cause 6. Hematuria- looks lower tract on microscopy, may need work up if  doesn't resolve with treatment of acute illness 7. R carotid stenosis, moderate- asymptomatic   Vinson Moselle  MD [facility] [phone] pgr    [phone] cell 12/14/2011, 12:06 PM

## 2011-12-15 LAB — CBC
MCHC: 32.8 g/dL (ref 30.0–36.0)
Platelets: 76 10*3/uL — ABNORMAL LOW (ref 150–400)
RDW: 16.5 % — ABNORMAL HIGH (ref 11.5–15.5)
WBC: 3.4 10*3/uL — ABNORMAL LOW (ref 4.0–10.5)

## 2011-12-15 LAB — BASIC METABOLIC PANEL
CO2: 30 mEq/L (ref 19–32)
Calcium: 6 mg/dL — CL (ref 8.4–10.5)
Chloride: 97 mEq/L (ref 96–112)
Creatinine, Ser: 2.35 mg/dL — ABNORMAL HIGH (ref 0.50–1.10)
GFR calc Af Amer: 24 mL/min — ABNORMAL LOW (ref >90)
GFR calc Af Amer: 25 mL/min — ABNORMAL LOW (ref >90)
GFR calc non Af Amer: 21 mL/min — ABNORMAL LOW (ref >90)
GFR calc non Af Amer: 21 mL/min — ABNORMAL LOW (ref >90)
Potassium: 2.6 mEq/L — CL (ref 3.5–5.1)
Sodium: 137 mEq/L (ref 135–145)

## 2011-12-15 LAB — CULTURE, BLOOD (ROUTINE X 2)

## 2011-12-15 LAB — PROTIME-INR
INR: 1.62 — ABNORMAL HIGH (ref 0.00–1.49)
Prothrombin Time: 19.5 seconds — ABNORMAL HIGH (ref 11.6–15.2)

## 2011-12-15 LAB — GLUCOSE, CAPILLARY
Glucose-Capillary: 91 mg/dL (ref 70–99)
Glucose-Capillary: 83 mg/dL (ref 70–99)
Glucose-Capillary: 82 mg/dL (ref 70–99)

## 2011-12-15 LAB — MAGNESIUM: Magnesium: 1.1 mg/dL — ABNORMAL LOW (ref 1.5–2.5)

## 2011-12-15 MED ORDER — MAGNESIUM SULFATE 40 MG/ML IJ SOLN
2.0000 g | Freq: Once | INTRAMUSCULAR | Status: AC
  Administered 2011-12-15: 2 g via INTRAVENOUS
  Filled 2011-12-15: qty 50

## 2011-12-15 MED ORDER — FAT EMULSION 20 % IV EMUL
240.0000 mL | INTRAVENOUS | Status: AC
  Filled 2011-12-15: qty 250

## 2011-12-15 MED ORDER — SODIUM CHLORIDE 0.9 % IV SOLN: INTRAVENOUS | Status: DC

## 2011-12-15 MED ORDER — FLUCONAZOLE IN SODIUM CHLORIDE 400-0.9 MG/200ML-% IV SOLN
800.0000 mg | Freq: Once | INTRAVENOUS | Status: AC
  Administered 2011-12-15: 800 mg via INTRAVENOUS
  Filled 2011-12-15: qty 400

## 2011-12-15 MED ORDER — SODIUM CHLORIDE 0.9 % IV SOLN
INTRAVENOUS | Status: DC
  Administered 2011-12-15 – 2011-12-16 (×3): via INTRAVENOUS
  Filled 2011-12-15 (×6): qty 1000

## 2011-12-15 MED ORDER — ZINC TRACE METAL 1 MG/ML IV SOLN
INTRAVENOUS | Status: DC
  Filled 2011-12-15: qty 1000

## 2011-12-15 MED ORDER — ENOXAPARIN SODIUM 80 MG/0.8ML ~~LOC~~ SOLN
70.0000 mg | SUBCUTANEOUS | Status: DC
  Filled 2011-12-15: qty 0.8

## 2011-12-15 MED ORDER — FLUCONAZOLE IN SODIUM CHLORIDE 400-0.9 MG/200ML-% IV SOLN: 400.0000 mg | INTRAVENOUS | Status: DC

## 2011-12-15 MED ORDER — POTASSIUM CHLORIDE CRYS ER 20 MEQ PO TBCR
40.0000 meq | EXTENDED_RELEASE_TABLET | Freq: Once | ORAL | Status: AC
  Administered 2011-12-15: 40 meq via ORAL
  Filled 2011-12-15: qty 2

## 2011-12-15 MED ORDER — FLUCONAZOLE IN SODIUM CHLORIDE 200-0.9 MG/100ML-% IV SOLN
200.0000 mg | INTRAVENOUS | Status: DC
  Administered 2011-12-16 – 2011-12-17 (×2): 200 mg via INTRAVENOUS
  Filled 2011-12-15 (×2): qty 100

## 2011-12-15 MED ORDER — ENOXAPARIN SODIUM 80 MG/0.8ML ~~LOC~~ SOLN
70.0000 mg | SUBCUTANEOUS | Status: DC
  Administered 2011-12-15 – 2011-12-16 (×2): 70 mg via SUBCUTANEOUS
  Filled 2011-12-15 (×3): qty 0.8

## 2011-12-15 MED ORDER — MORPHINE SULFATE 2 MG/ML IJ SOLN: 1.0000 mg | Freq: Once | INTRAMUSCULAR | Status: DC

## 2011-12-15 MED ORDER — LORAZEPAM 2 MG/ML IJ SOLN: 1.0000 mg | Freq: Once | INTRAMUSCULAR | Status: DC

## 2011-12-15 MED ORDER — WARFARIN SODIUM 7.5 MG PO TABS
7.5000 mg | ORAL_TABLET | Freq: Once | ORAL | Status: AC
  Administered 2011-12-15: 7.5 mg via ORAL
  Filled 2011-12-15: qty 1

## 2011-12-15 MED ORDER — WARFARIN SODIUM 4 MG PO TABS
4.0000 mg | ORAL_TABLET | Freq: Once | ORAL | Status: DC
  Filled 2011-12-15: qty 1

## 2011-12-15 NOTE — Progress Notes (Signed)
Subjective: Feeling somewhat better.  Got 4.7 L in yesterday and 3600 out.  Creatinine and sodium have improved but K remains low.  Looking to get PICC replaced today.  Asking appropriate questions regarding situation.  Creatinine is trending better, albeit very slowly the last 2 days  Objective Vital signs in last 24 hours: Filed Vitals:   12/14/11 0557 12/14/11 1326 12/14/11 2100 12/15/11 0500  BP: 115/64 130/82 123/73 137/77  Pulse: 61 57 60 63  Temp: 98.3 F (36.8 C) 98.8 F (37.1 C) 99.1 F (37.3 C) 98.7 F (37.1 C)  TempSrc: Oral Oral Oral Oral  Resp: 16 14 18 20   Height:      Weight: 73.1 kg (161 lb 2.5 oz)   74.8 kg (164 lb 14.5 oz)  SpO2: 96% 99% 95% 97%   Weight change: 1.7 kg (3 lb 12 oz)  Intake/Output Summary (Last 24 hours) at 12/15/11 1406 Last data filed at 12/15/11 1100  Gross per 24 hour  Intake 2851.67 ml  Output   3050 ml  Net -198.33 ml   Labs: Basic Metabolic Panel:  Lab 12/15/11 1027 12/14/11 0432 12/13/11 0600 12/12/11 0610 12/11/11 0512 12/10/11 0645 12/09/11 1730 12/08/11 1757  NA 137 134* 133* 131* 131* 130* 128* --  K 2.6* 2.7* 3.3* 3.0* 3.6 4.1 3.9 --  CL 97 104 110 109 108 105 100 --  CO2 33* 21 13* 11* 14* 17* 18* --  GLUCOSE 93 71 76 89 115* 131* 64* --  BUN 14 18 25* 29* 32* 40* 37* --  CREATININE 2.41* 2.52* 2.67* 2.49* 2.39* 2.42* 2.38* --  ALB -- -- -- -- -- -- -- --  CALCIUM 6.0* 6.3* 7.0* 6.8* 7.2* 7.6* 7.7* --  PHOS -- -- -- -- 4.1 4.7* -- 2.7   Liver Function Tests:  Lab 12/11/11 0512 12/10/11 0645 12/09/11 1730  AST 24 22 26   ALT 22 20 23   ALKPHOS 126* 115 130*  BILITOT 0.5 0.8 1.2  PROT 7.7 7.5 8.4*  ALBUMIN 2.4* 2.4* 2.7*   No results found for this basename: LIPASE:3,AMYLASE:3 in the last 168 hours No results found for this basename: AMMONIA:3 in the last 168 hours CBC:  Lab 12/15/11 0906 12/14/11 0432 12/13/11 0600 12/12/11 0610 12/10/11 0645 12/08/11 1757  WBC 3.4* 4.0 4.0 3.6* -- --  NEUTROABS -- -- -- -- 2.0  2.4  HGB 8.4* 8.3* 8.8* 6.5* -- --  HCT 25.6* 24.0* 26.0* 19.7* -- --  MCV 85.0 81.9 82.5 83.5 -- --  PLT 76* 66* 73* 68* -- --   PT/INR: @labrcntip (inr:5) Cardiac Enzymes: No results found for this basename: CKTOTAL:5,CKMB:5,CKMBINDEX:5,TROPONINI:5 in the last 168 hours CBG:  Lab 12/15/11 1225 12/15/11 0814 12/15/11 0230 12/14/11 2113 12/14/11 1751  GLUCAP 75 83 91 91 88    Iron Studies: No results found for this basename: IRON:30,TIBC:30,TRANSFERRIN:30,FERRITIN:30 in the last 168 hours  Physical Exam:  Blood pressure 137/77, pulse 63, temperature 98.7 F (37.1 C), temperature source Oral, resp. rate 20, height 5\' 8"  (1.727 m), weight 74.8 kg (164 lb 14.5 oz), SpO2 97.00%.  Gen: alert, thin adult female, no distress  Skin: no rash, cyanosis  HEENT: EOMI, sclera anicteric, throat slightly dry  Neck: no JVD, loud deep R carotid bruit, no LAN  Chest: clear bilat, good air movement  Heart: regular, no rub or gallop, +2/6 SEM LUSB  Abdomen: soft, nontender, no HSM, no ascites, healed scars from abd surgery  Ext: no LE edema or UE edema  Neuro:  alert, Ox3, no focal deficit   UA- 1.013, pH 7, 100 prot, 0-2wbc, TNTC rbc, few bact  UNa- 107  CXR- no acute findings  Renal US- Right kidney 12.9 cm, Left kidney 13.6 cm. +mild increased echogenicity bilat  Impression/Recommendations:  1. Renal failure, acute and/or chronic. Baseline creat was 0.5 last year, but increased to 1.1 from Dec 2012 to May 2013. AKI prerenal vs ATN due to sepsis. Creat mid 2's, good urine output, BP OK. Urine sediment unremarkable w lower tract RBC's, no oxalate crystals and no casts. Less likely oxalosis or AIN due to intermittent cipro she takes for bact overgrowth. Has good BP and low pulse so dont think is exceedingly dry.  This is a confusing picture.  2. Metabolic acidosis- due to renal failure vs diarrhea, check urine lytes. resolved 3. Fungal bacteremia, PICC-related. Cath is out, on IV antifungals. May be  what is responsible for slow recovery? 4. 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 Chronic anemia- unclear cause 6. Hematuria- looks lower tract on microscopy, may need work up if doesn't resolve with treatment of acute illness 7. Hypokalemia- on repletion.  Will get better when can get TPN back 8. R carotid stenosis, moderate- asymptomatic  Will continue to watch, may eventually need kidney biopsy if does not continue to improve or worsen.  Would not necessarily need to be inpatient though   [name] [name] A 12/15/2011, 2:06 PM

## 2011-12-15 NOTE — Progress Notes (Addendum)
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 Iohexol Itching    IVP Dye ok if taken with benadryl  . Penicillins Itching   Patient Measurements: Height: 5\' 8"  (172.7 cm) Weight: 164 lb 14.5 oz (74.8 kg) IBW/kg (Calculated) : 63.9   Vital Signs: Temp: 98.7 F (37.1 C) (08/05 0500) Temp src: Oral (08/05 0500) BP: 137/77 mmHg (08/05 0500) Pulse Rate: 63  (08/05 0500)  Labs:  Basename 12/14/11 0432 12/13/11 0600  HGB 8.3* 8.8*  HCT 24.0* 26.0*  PLT 66* 73*  APTT -- --  LABPROT 20.9* 21.5*  INR 1.77* 1.83*  HEPARINUNFRC -- --  CREATININE 2.52* 2.67*  CKTOTAL -- --  CKMB -- --  TROPONINI -- --   Estimated Creatinine Clearance: 23.6 ml/min (by C-G formula based on Cr of 2.52).  Medications:  Prescriptions prior to admission  Medication Sig Dispense Refill  . ADULT TPN Inject 2,100 mLs into the vein continuous. Infuse over 10 hours with 1 hour ramp up and 1 hour ramp down. Total bag per day provides: 250g Dextrose 70%, 100g Travasol 10%, 45g Intralipids 30%, various electrolytes, MVI, TE, Zinc, selenium.      . calcium carbonate (TUMS EX) 750 MG chewable tablet Chew 2 tablets by mouth 3 (three) times daily. Calcium      . ciprofloxacin (CIPRO) 500 MG tablet TAKE 1 TABLET BY MOUTH TWICE DAILY FOR 1 WEEK AT A TIME INTERMITTENTLY AS DIRECTED FOR BACTERIAL OVERGROWTH  28 tablet  4  . diphenhydrAMINE (BENADRYL) 25 mg capsule Take 25 mg by mouth every 6 (six) hours as needed. For itching/sleep.      . Eflornithine HCl (VANIQA) 13.9 % cream Apply 1 application topically 2 (two) times daily with a meal. Applied to facial hair.      . Multiple Vitamin (MULTIVITAMIN WITH MINERALS) TABS Take 1 tablet by mouth daily.      Marland Kitchen omeprazole (PRILOSEC) 40 MG capsule Take 40 mg by mouth daily.      . Parenteral Electrolytes (TPN  ELECTROLYTES IV) Inject 1 each into the vein at bedtime.       . promethazine (PHENERGAN) 25 MG tablet Take 25 mg by mouth every 6 (six) hours as needed. For nausea.      . ursodiol (ACTIGALL) 300 MG capsule TAKE 1 CAPSULE BY MOUTH TWICE DAILY  60 capsule  5  . Vitamin D, Ergocalciferol, (DRISDOL) 50000 UNITS CAPS ALTERNATE TAKING 1 CAPSULE BY MOUTH EVERY OTHER DAY AND 2 CAPSULES BY MOUTH EVERY OTHER DAY  45 capsule  1  . warfarin (COUMADIN) 10 MG tablet Take 1 tablet (10 mg total) by mouth daily.  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 Welton Flakes.   Home dose reported as Warfarin 10 mg daily.   INR >4 on admission. Warfarin restarted 7/30 after INR <2 after transfusions. Lovenox d/c'd 8/1. Home 10 mg dose appears too high.  Difficult to dose Warfarin with nutrition held, attempting therapeutic INR without overshoot.  Current INR subtherapeutic.  Add LMWH back until INR > 1.8 (1mg /kg q 24 h d/t CrCl).   SCr improved, Cl < 39ml/min.  Pt received 2 units PRBCs 8/2, H/H improved, now drifting down again.  No bleeding  noted.  Chronic thrombocytonpenia.    Goal of Therapy:  INR 2.5-3. Monitor platelets daily.   Plan:  Warfarin 7.5 mg po x 1  LMWH 70mg  SQ q 24 hours  Daily PT/INR  Wynonia Hazard PharmD Pager [phone] 12/15/2011 8:23 AM

## 2011-12-15 NOTE — Progress Notes (Signed)
Per phlebotomist's request, I spoke with the pharmacy in regards to the patient's scheduled morning labs (TNA lab work). Pt is not currently receiving TNA and per pharmacy, it is okay to hold on drawing the labs this am. [name] [name] (phlebotomist) is aware.

## 2011-12-15 NOTE — Progress Notes (Signed)
Primary RN asked me to assess/restart PIV.   Pt. States, "It was hurting earlier but it feels fine now".   "Let's just wait and watch it".   Site WNL.   For a PICC tomorrow.    Instructed pt to  Have nurse page IV Team if it starts to hurt or bother her at all.   Primary RN made aware also.

## 2011-12-15 NOTE — Progress Notes (Signed)
INFECTIOUS DISEASE PROGRESS NOTE  ID: [name] [name] is a 61 y.o. 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  Came in with fever and abdominal pain.  Blood cultures grew out Candida parapsolosis, 2/2.  Repeat cultures 8/3 negative to date.    Subjective: Feels better overall, no fever, chills  Abtx:  Anti-infectives     Start     Dose/Rate Route Frequency Ordered Stop   12/16/11 1400   fluconazole (DIFLUCAN) IVPB 400 mg        400 mg 200 mL/hr over 60 Minutes Intravenous Every 24 hours 12/15/11 1303     12/15/11 1400   fluconazole (DIFLUCAN) IVPB 800 mg        800 mg 400 mL/hr over 60 Minutes Intravenous  Once 12/15/11 1303 12/15/11 1421   12/12/11 1800   micafungin (MYCAMINE) 100 mg in sodium chloride 0.9 % 100 mL IVPB  Status:  Discontinued        100 mg 100 mL/hr over 1 Hours Intravenous Daily after supper 12/12/11 1423 12/15/11 1303   12/12/11 1300   micafungin (MYCAMINE) 100 mg in sodium chloride 0.9 % 100 mL IVPB  Status:  Discontinued        100 mg 100 mL/hr over 1 Hours Intravenous Daily 12/12/11 1216 12/12/11 1423   12/11/11 2245   fluconazole (DIFLUCAN) IVPB 200 mg  Status:  Discontinued        200 mg 100 mL/hr over 60 Minutes Intravenous Daily at bedtime 12/11/11 2229 12/12/11 1215          Medications: I have reviewed the patient's current medications.  Objective: Vital signs in last 24 hours: Temp:  [98.7 F (37.1 C)-99.1 F (37.3 C)] 98.7 F (37.1 C) (08/05 0500) Pulse Rate:  [60-63] 63  (08/05 0500) Resp:  [18-20] 20  (08/05 0500) BP: (123-137)/(73-77) 137/77 mmHg (08/05 0500) SpO2:  [95 %-97 %] 97 % (08/05 0500) Weight:  [164 lb 14.5 oz (74.8 kg)] 164 lb 14.5 oz (74.8 kg) (08/05 0500)   General appearance: alert, cooperative and no distress Resp: clear to auscultation bilaterally Cardio: regular rate and rhythm, S1, S2 normal, no murmur, click, rub or gallop  Lab Results  Basename 12/15/11 0906  12/14/11 0432  WBC 3.4* 4.0  HGB 8.4* 8.3*  HCT 25.6* 24.0*  NA 137 134*  K 2.6* 2.7*  CL 97 104  CO2 33* 21  BUN 14 18  CREATININE 2.41* 2.52*  GLU -- --   Liver Panel No results found for this basename: PROT:2,ALBUMIN:2,AST:2,ALT:2,ALKPHOS:2,BILITOT:2,BILIDIR:2,IBILI:2 in the last 72 hours Sedimentation Rate No results found for this basename: ESRSEDRATE in the last 72 hours C-Reactive Protein No results found for this basename: CRP:2 in the last 72 hours  Microbiology: Recent Results (from the past 240 hour(s))  CULTURE, BLOOD (ROUTINE X 2)     Status: Normal (Preliminary result)   Collection Time   12/09/11  2:15 PM      Component Value Range Status Comment   Specimen Description BLOOD RIGHT ARM   Final    Special Requests BOTTLES DRAWN AEROBIC AND ANAEROBIC 5CC   Final    Culture  Setup Time 12/10/2011 00:49   Final    Culture     Final    Value: YEAST     Note: Gram Stain Report Called to,Read Back By and Verified With: LORI HARRIS @0330  ON 12/12/2011 BY MCLET   Report Status PENDING   Incomplete  CULTURE, BLOOD (ROUTINE X 2)     Status: Normal   Collection Time   12/09/11  2:25 PM      Component Value Range Status Comment   Specimen Description Blood   Final    Special Requests Normal   Final    Culture  Setup Time 12/10/2011 00:25   Final    Culture     Final    Value: CANDIDA PARAPSILOSIS     Note: Gram Stain Report Called to,Read Back By and Verified With: LAURIE HARRIS @ 1923 ON 12/11/11 BY GOLLD   Report Status 12/14/2011 FINAL   Final   CULTURE, BLOOD (ROUTINE X 2)     Status: Normal (Preliminary result)   Collection Time   12/13/11  5:50 AM      Component Value Range Status Comment   Specimen Description BLOOD LEFT HAND   Final    Special Requests BOTTLES DRAWN AEROBIC ONLY 5 CC   Final    Culture  Setup Time 12/13/2011 11:13   Final    Culture     Final    Value:        BLOOD CULTURE RECEIVED NO GROWTH TO DATE CULTURE WILL BE HELD FOR 5 DAYS BEFORE  ISSUING A FINAL NEGATIVE REPORT   Report Status PENDING   Incomplete   CULTURE, BLOOD (ROUTINE X 2)     Status: Normal (Preliminary result)   Collection Time   12/13/11  6:00 AM      Component Value Range Status Comment   Specimen Description BLOOD LEFT HAND   Final    Special Requests BOTTLES DRAWN AEROBIC AND ANAEROBIC 5 CC EACH   Final    Culture  Setup Time 12/13/2011 11:13   Final    Culture     Final    Value:        BLOOD CULTURE RECEIVED NO GROWTH TO DATE CULTURE WILL BE HELD FOR 5 DAYS BEFORE ISSUING A FINAL NEGATIVE REPORT   Report Status PENDING   Incomplete     Studies/Results: No results found.   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  Will change to IV floconazole (800 mg x 1 then 400 mg daily).  She should get 2 weeks total from 8/3-8/16.   -echo with no concerning signs of endocarditis.   -she should get an eye exam to r/o endophthalmitis prior to stopping the fluconazole on 8/16 (outpatient ok).   -PICC tomorrow if blood cultures still negative from 8/3.    Harvie Morua Infectious Diseases 12/15/2011, 2:53 PM

## 2011-12-15 NOTE — Progress Notes (Signed)
Pharmacy TNA Update: Informed that unable to have PICC placed today, therefore TNA will not start until 8/6.  12/15/2011 4:33 PM

## 2011-12-15 NOTE — Progress Notes (Signed)
PARENTERAL NUTRITION CONSULT NOTE - Follow Up  Pharmacy Consult for TNA Indication: Short Gut Syndrome  Allergies  Allergen Reactions  . Iohexol Itching    IVP Dye ok if taken with benadryl  . Penicillins Itching    Patient Measurements: Height: 5\' 8"  (172.7 cm) Weight: 164 lb 14.5 oz (74.8 kg) IBW/kg (Calculated) : 63.9  Adjusted Body Weight: 67.4kg Usual Weight: ~155 lbs (70.4kg) - per pt report  Vital Signs: Temp: 98.7 F (37.1 C) (08/05 0500) Temp src: Oral (08/05 0500) BP: 137/77 mmHg (08/05 0500) Pulse Rate: 63  (08/05 0500) Intake/Output from previous day: 08/04 0701 - 08/05 0700 In: 4741.7 [P.O.:840; I.V.:2391.7; IV Piggyback:1510] Out: 3650 [Urine:2150; Stool:1500] I/O: +2.4L Intake/Output from this shift: Total I/O In: 240 [P.O.:240] Out: -   Labs:  Basename 12/15/11 0906 12/14/11 0432 12/13/11 0600  WBC 3.4* 4.0 4.0  HGB 8.4* 8.3* 8.8*  HCT 25.6* 24.0* 26.0*  PLT 76* 66* 73*  APTT -- -- --  INR 1.62* 1.77* 1.83*     Basename 12/15/11 0906 12/14/11 0432 12/13/11 0600  NA 137 134* 133*  K 2.6* 2.7* 3.3*  CL 97 104 110  CO2 33* 21 13*  GLUCOSE 93 71 76  BUN 14 18 25*  CREATININE 2.41* 2.52* 2.67*  LABCREA -- -- --  CREAT24HRUR -- -- --  CALCIUM 6.0* 6.3* 7.0*  MG -- -- --  PHOS -- -- --  PROT -- -- --  ALBUMIN -- -- --  AST -- -- --  ALT -- -- --  ALKPHOS -- -- --  BILITOT -- -- --  BILIDIR -- -- --  IBILI -- -- --  PREALBUMIN -- -- --  TRIG -- -- --  CHOLHDL -- -- --  CHOL -- -- --   Estimated Creatinine Clearance: 24.7 ml/min (by C-G formula based on Cr of 2.41).    Basename 12/15/11 0814 12/14/11 2113 12/14/11 1751  GLUCAP 83 91 88    Medical History: Past Medical History  Diagnosis Date  . Short bowel syndrome   . Abnormal LFTs   . Vitamin d deficiency   . Personal history of colonic polyps   . Thrombophilia   . Diabetes mellitus     resolved after weight loss  . Obesity     prior to weight loss  . Allergic  rhinosinusitis   . At risk for dental problems   . Fracture of left clavicle   . Osteoporosis   . Anemia of chronic disease   . Renal insufficiency   . Atypical nevus   . Pathologic fracture of neck of femur   . Pancytopenia 10/07/2011  . Small bowel ischemia   . Superior mesenteric artery stenosis   . Bacterial overgrowth syndrome     Medications:  Scheduled:     . B-complex with vitamin C  1 tablet Oral Daily  . diphenoxylate-atropine  1 tablet Oral TID  . enoxaparin (LOVENOX) injection  70 mg Subcutaneous Q24H  . feeding supplement  237 mL Oral Daily  . feeding supplement  1 Container Oral Q1400  . glucose-Vitamin C      . micafungin (MYCAMINE) IV  100 mg Intravenous QPC supper  . multivitamin with minerals  1 tablet Oral Daily  . pantoprazole  40 mg Oral Q1200  . potassium chloride  10 mEq Intravenous Q1 Hr x 4  . potassium chloride  40 mEq Oral Q8H  . sodium chloride  1,000 mL Intravenous Once  . sodium chloride  1,000  mL Intravenous Once  . warfarin  4 mg Oral Once  . warfarin  7.5 mg Oral ONCE-1800  . Warfarin - Pharmacist Dosing Inpatient   Does not apply q1800  . DISCONTD: enoxaparin (LOVENOX) injection  70 mg Subcutaneous Q24H  . DISCONTD: warfarin  4 mg Oral ONCE-1800   Infusions:     . dextrose 5 % 1,000 mL with sodium bicarbonate 150 mEq infusion 250 mL/hr at 12/15/11 0904  . DISCONTD:  sodium bicarbonate infusion sterile water 1000 mL 200 mL/hr at 12/14/11 0651    Insulin Requirements in the past 24 hours:  No SSI required.  Hypoglycemic off TNA.  cBGs 66-91   Nutritional Goals:  Per RD assessment 7/30: Kcal: 1600-1750 kcal, Protein: 58-72g , Fluid: 2.5 L  Goal TNA (adjusting per RD recommendations) = Clinimx E 4.25/25 - infused over 10-12 hours plus IV lipids over 10-12hr on MWF to provide 61g Protein/day, 1948 kcal/day on lipid days, 1468 on non-lipid days, for an average of 1674 kcal/day.  Current Nutrition:  Pt OFF TNA 8/2-8/5 d/t to PICC  line removal.  Ensure Complete and Resource Breeze ordered, but pt has refused all except one dose. On Regular diet, PO intake recorded but pt not likely absorbing (see assessment below)  Pt on chronic home TNA.    Plan for PICC line to be replaced and cyclic TNA to be resumed today 8/5.  Assessment: 61 yo F admitted 12/08/11 with CC: low grade fever and inability to eat for several months, found to be in worsening renal failure, ultimately admitted for dehydration/renal failure. 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 She is on chronic TNA at home (for 7 years, per pt report) infused via PICC (placed 08/13/11 at [facility] per report) Pt also has hx of abnormal LFTs, DM ("resolved after weight loss"), erosive esophagitis, anemia of chronic dz, and renal insufficiency. She is on abx frequently for bacteria overgrowth in bowel. TNA resumed 7/30 inpatient.  Pharmacist had lengthy discussion with patient on 7/30, who reports that she's been on TNA for 7 years. She reports that she's allowed to eat "whatever she wants" at home in addition to the TNA, but the food "usually passes right though her" (her daughter estimates within 30 mins). For the past couple weeks, patient reports having no appetite; however, she mentions she is hungry this morning. Spoke with a family member over the phone (granddaughter) who read her home TNA label to me, which indicates: 2100 ml total cycled over 10 hours with a 1 hour ramp up and 1 hour ramp down. TNA bag ( ) contains: 250g Dextrose, 100g Travasol 10%, 45g Intralipids 30%, MVI, TE, Zinc, Selenium, and various electrolytes. Per patient report, TNA bag also contains B-complex vitamins and folic acid, however this information was not relayed to me over the phone. Bag does not contain any insulin. This home TNA provides approx 45g Protein/day and 1675 Kcal/day. Patient is agreeable to using [place]  supplied TNA and is ok if TNA is cycled over 12 hours instead of usual 10 hours.  TNA goals adjusted to RD's recommendations inpatient: kcal goal remains the same; however, RD recommends a higher protein goal.  Advancing to higher protein supplementation is limited by the patient's current renal failure.  Labs: Lytes: Hyponatremia resolved. K continues to decrease.   Renal: SCr trending down. CrCl ~ 39ml/mn.    LFTs: wnl, Alk Phos slightly elevated CBGs: hypoglycemic  event 8/4 off TNA Prealbumin: 12.3 (7/31), TNA labs d/c'd 8/5, will restart protocol orders.   GI PPx: PO Protonix IVF: NS at 100 ml/hr per MD 7/30 Blood Cultures:  Candida parapsilosis.   8/3 Blood Cultures:  NGTD   Plan: 1) Restart Clinimix E 4.25/25, cycled over 12 hours (6p-6a): 6ml/hr x1hr, 95ml/hr x 10hr, 8ml/hr x1 hour (off 6a-6p).   2) IV lipids, MVI, and TE on MWF only due to national back order. Will continue to put MVI and TE in TNA due to short gut syndrome and questionable PO absorption (pt has active order for PO MVI).  Thiamine and Folic acid will be added to TNA daily per MD request.    3) SSI sensitive scale revolving around cyclic admin times (due to hx of DM, even though reportedly resolved).  4) BMET in AM. TNA labs qMon/Thurs. 5)  Follow up potassium replacement (per protocol, pharmacy unable to replace when CrCl < 59ml/min)  Haynes Hoehn, PharmD 12/15/2011 10:16 AM  Pager: [phone]

## 2011-12-15 NOTE — Progress Notes (Addendum)
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 Patient came in to the hospital with acute anemia and dehydration. Hospital course prolonged due to AKI and candida bacteremia.    Assessment/ plan:  pancytopenia  -Patient has history of chronic anemia secondary to chronic disease, she follows with Dr. Welton Flakes  -Patient hemoglobin usually between 8.5-9, she came in with hemoglobin of 7.2.  -Status post transfusion of 4 units since admission  -She had recent 5 negative cards for FOBT on 11/03/2011. She has negative EGD/colonoscopy last year.  -appreciate GI eval. i discussed with her hematologist Dr Welton Flakes who feels her chronic pancytopenia is due to chronic underlying illness and given her anemia at baseline would not need bone marrow bx as inpatient and will plan as outpatient  -gets monthly arenesp injection.   Fever with candidal bacteremia  Patient had temp spike of 103 on 8/1 with both blood cx from 7/31 growing yeast  started on fluconazole which has been changed to micafungin by ID Dr. Luciana Axe, recommends 14 days of treatment, ( day 4) getting a TTE and ophthalmology evaluation as outpatient  -PICC line removed.  -repeat blood cx negative. Ordered for IR guided PICC placement. Likely will be placed tomorrow morning -2 D echo shows mildly reduced LV function with EF 40-45% and akinesis of apical myocardium . No vegetation noted.  Patient currently is volume depleted.   Short gut syndrome  -Patient is on total parenteral nutrition secondary to short gut (reported 1 foot left of her small bowel).  -Patient mentioned of missing out on nutrients and MVs Due to shortage in the injectable multivitamins.  -holding off on TPN today as PICC is removed  . Continue IV fluids for now until another PICC ( tomorrow)  Acute renal failure  initially thought likely secondary to dehydration. Renal USG unremarkable  -as per GI could possibly be associated with TPN as well.  -appreciate renal recs. Possible prerenal AKI vs ATN due to sepsis  -low bicarb also noted  Given aggressive hydration with 3 amps bicarb on 8/3 fluid increased to 250 cc/ hr ( D5 with bicarb AFTER 1 L iv NS bolus given)  Acidosis improved. Urine output quite good  Renal fn slowly improving. Witch fluids ( D5 with bicarb) to D5 NS   Hyponatremia  - likely secondary to dehydration and acute renal failure.  -slowly improving with fluids .  Hypokalemia  Noted for k of 2.6 today despite 4 runs kcl on 8/4. Will give po kcl and also with fluids. Monitor in am Also low mg of 1.1 will order 2 g mgso4. Low ca noted but corrected level of 8.5 Diarrhea  : chronic  on lomotil   Hematuria  -Patient has painless hematuria occasinally but stable  -needs w/up if persistent   DVT prophylaxis  On coumadin per pharmacy   Code Status:full  Family Communication: daughter at bedside  Disposition Plan: home once stable   Consultants:  [facility]  [facility] ( renal)  [facility] ( ID) Procedures:  none Antibiotics:  On IV micofungin for 2 weeks ( started on 8/2) switched to IV fluconazole today ( will complete 2 weeks course on 8/16)  HPI/Subjective:  Feels better overall. still has diarrhea   Objective: Filed Vitals:   12/14/11 0557 12/14/11 1326  12/14/11 2100 12/15/11 0500  BP: 115/64 130/82 123/73 137/77  Pulse: 61 57 60 63  Temp: 98.3 F (36.8 C) 98.8 F (37.1 C) 99.1 F (37.3 C) 98.7 F (37.1 C)  TempSrc: Oral Oral Oral Oral  Resp: 16 14 18 20   Height:      Weight: 73.1 kg (161 lb 2.5 oz)   74.8 kg (164 lb 14.5 oz)  SpO2: 96% 99% 95% 97%    Intake/Output Summary (Last 24 hours) at 12/15/11 1420 Last data filed at 12/15/11 1100  Gross per 24 hour  Intake 2601.67 ml    Output   3050 ml  Net -448.33 ml    Exam:    General: Elderly female in NAD  HEENT: no pallor, dry oral mucosa  CVS: N S1&S2, no murmurs  Respiratory: clear b/l no added sounds  Abdomen: old laprotomy scar , soft, NT, ND, BS+ CNS: AAO X 3 , non focal  Ext: Warm, no edema  Cns: aaox3   Data Reviewed: Basic Metabolic Panel:  Lab 12/15/11 1610 12/14/11 0432 12/13/11 0600 12/12/11 0610 12/11/11 0512 12/10/11 0645 12/08/11 1757  NA 137 134* 133* 131* 131* -- --  K 2.6* 2.7* 3.3* 3.0* 3.6 -- --  CL 97 104 110 109 108 -- --  CO2 33* 21 13* 11* 14* -- --  GLUCOSE 93 71 76 89 115* -- --  BUN 14 18 25* 29* 32* -- --  CREATININE 2.41* 2.52* 2.67* 2.49* 2.39* -- --  CALCIUM 6.0* 6.3* 7.0* 6.8* 7.2* -- --  MG 1.1* -- -- -- 1.8 2.1 1.7  PHOS -- -- -- -- 4.1 4.7* 2.7   Liver Function Tests:  Lab 12/11/11 0512 12/10/11 0645 12/09/11 1730 12/08/11 1757  AST 24 22 26 26   ALT 22 20 23 24   ALKPHOS 126* 115 130* 140*  BILITOT 0.5 0.8 1.2 0.6  PROT 7.7 7.5 8.4* 8.1  ALBUMIN 2.4* 2.4* 2.7* 2.6*   No results found for this basename: LIPASE:5,AMYLASE:5 in the last 168 hours No results found for this basename: AMMONIA:5 in the last 168 hours CBC:  Lab 12/15/11 0906 12/14/11 0432 12/13/11 0600 12/12/11 0610 12/11/11 0512 12/10/11 0645 12/08/11 1757  WBC 3.4* 4.0 4.0 3.6* 3.2* -- --  NEUTROABS -- -- -- -- -- 2.0 2.4  HGB 8.4* 8.3* 8.8* 6.5* 7.5* -- --  HCT 25.6* 24.0* 26.0* 19.7* 22.7* -- --  MCV 85.0 81.9 82.5 83.5 82.8 -- --  PLT 76* 66* 73* 68* 75* -- --   Cardiac Enzymes: No results found for this basename: CKTOTAL:5,CKMB:5,CKMBINDEX:5,TROPONINI:5 in the last 168 hours BNP (last 3 results) No results found for this basename: PROBNP:3 in the last 8760 hours CBG:  Lab 12/15/11 1225 12/15/11 0814 12/15/11 0230 12/14/11 2113 12/14/11 1751  GLUCAP 75 83 91 91 88    Recent Results (from the past 240 hour(s))  CULTURE, BLOOD (ROUTINE X 2)     Status: Normal (Preliminary result)    Collection Time   12/09/11  2:15 PM      Component Value Range Status Comment   Specimen Description BLOOD RIGHT ARM   Final    Special Requests BOTTLES DRAWN AEROBIC AND ANAEROBIC 5CC   Final    Culture  Setup Time 12/10/2011 00:49   Final    Culture     Final    Value: YEAST     Note: Gram Stain Report Called to,Read Back By and Verified With: LORI HARRIS @0330  ON 12/12/2011 BY MCLET  Report Status PENDING   Incomplete   CULTURE, BLOOD (ROUTINE X 2)     Status: Normal   Collection Time   12/09/11  2:25 PM      Component Value Range Status Comment   Specimen Description Blood   Final    Special Requests Normal   Final    Culture  Setup Time 12/10/2011 00:25   Final    Culture     Final    Value: CANDIDA PARAPSILOSIS     Note: Gram Stain Report Called to,Read Back By and Verified With: LAURIE HARRIS @ 1923 ON 12/11/11 BY GOLLD   Report Status 12/14/2011 FINAL   Final   CULTURE, BLOOD (ROUTINE X 2)     Status: Normal (Preliminary result)   Collection Time   12/13/11  5:50 AM      Component Value Range Status Comment   Specimen Description BLOOD LEFT HAND   Final    Special Requests BOTTLES DRAWN AEROBIC ONLY 5 CC   Final    Culture  Setup Time 12/13/2011 11:13   Final    Culture     Final    Value:        BLOOD CULTURE RECEIVED NO GROWTH TO DATE CULTURE WILL BE HELD FOR 5 DAYS BEFORE ISSUING A FINAL NEGATIVE REPORT   Report Status PENDING   Incomplete   CULTURE, BLOOD (ROUTINE X 2)     Status: Normal (Preliminary result)   Collection Time   12/13/11  6:00 AM      Component Value Range Status Comment   Specimen Description BLOOD LEFT HAND   Final    Special Requests BOTTLES DRAWN AEROBIC AND ANAEROBIC 5 CC EACH   Final    Culture  Setup Time 12/13/2011 11:13   Final    Culture     Final    Value:        BLOOD CULTURE RECEIVED NO GROWTH TO DATE CULTURE WILL BE HELD FOR 5 DAYS BEFORE ISSUING A FINAL NEGATIVE REPORT   Report Status PENDING   Incomplete      Studies: Dg Chest 2  View  12/09/2011  *RADIOLOGY REPORT*  Clinical Data: Fever and weakness.  Ex-smoker.  CHEST - 2 VIEW  Comparison: CT of 10/24/2011 and plain film of 01/17/2011.  Findings: A left-sided PICC line which is unchanged and terminates at the low SVC. Midline trachea.  Borderline cardiomegaly.  Age advanced aortic atherosclerosis. No pleural effusion or pneumothorax.  The reticular nodular opacities described on the prior plain film are improved to resolved.  Mild interstitial prominence remains, and is likely related to the clinical history of  prior smoking. No lobar consolidation.  IMPRESSION: Interstitial thickening, likely related to prior smoking / chronic bronchitis.  No evidence of acute superimposed pneumonia.  Original Report Authenticated By: Consuello Bossier, M.D.   US Renal  12/09/2011  *RADIOLOGY REPORT*  Clinical Data: Hematuria.  History of diabetes.  RENAL/URINARY TRACT ULTRASOUND COMPLETE  Comparison:  Abdominal ultrasound 10/29/2011.  Findings:  Right Kidney:  Mild diffuse increased echogenicity of the renal parenchyma is nonspecific.  No focal cystic or solid renal lesions. 12.9 cm in length.  No hydronephrosis. No definite calculi.  Left Kidney:  Mild diffuse increased echogenicity of the renal parenchyma is nonspecific.  No focal cystic or solid renal lesions. 13.6 cm in length.  No hydronephrosis. No definite calculi.  Bladder:  Well distended without focal wall abnormalities.  IMPRESSION: 1.  Mild diffusely increased echogenicity of the  the renal parenchyma bilaterally is nonspecific, but could suggest underlying medical renal disease. 2.  No focal cystic or solid renal lesions, and no definite evidence of calculi within the collecting systems.  Original Report Authenticated By: Florencia Reasons, M.D.    Scheduled Meds:   . B-complex with vitamin C  1 tablet Oral Daily  . diphenoxylate-atropine  1 tablet Oral TID  . enoxaparin (LOVENOX) injection  70 mg Subcutaneous Q24H  . feeding  supplement  237 mL Oral Daily  . feeding supplement  1 Container Oral Q1400  . fluconazole (DIFLUCAN) IV  400 mg Intravenous Q24H  . fluconazole (DIFLUCAN) IV  800 mg Intravenous Once  . glucose-Vitamin C      . LORazepam  1 mg Intravenous Once  .  morphine injection  1 mg Intravenous Once  . multivitamin with minerals  1 tablet Oral Daily  . pantoprazole  40 mg Oral Q1200  . potassium chloride  40 mEq Oral Q8H  . potassium chloride  40 mEq Oral Once  . warfarin  7.5 mg Oral ONCE-1800  . Warfarin - Pharmacist Dosing Inpatient   Does not apply q1800  . DISCONTD: enoxaparin (LOVENOX) injection  70 mg Subcutaneous Q24H  . DISCONTD: micafungin (MYCAMINE) IV  100 mg Intravenous QPC supper  . DISCONTD: warfarin  4 mg Oral ONCE-1800   Continuous Infusions:   . fat emulsion    . sodium chloride 0.9 % 1,000 mL with potassium chloride 40 mEq infusion 100 mL/hr at 12/15/11 1110  . TPN (CLINIMIX) +/- additives    . DISCONTD: sodium chloride    . DISCONTD: dextrose 5 % 1,000 mL with sodium bicarbonate 150 mEq infusion 250 mL/hr at 12/15/11 0904      Time spent: 30 minutes    Canyon Lohr  [facility] Pager [phone]. If 8PM-8AM, please contact night-coverage at [email or web address], password [facility] 12/15/2011, 2:20 PM  LOS: 7 days

## 2011-12-16 ENCOUNTER — Inpatient Hospital Stay (HOSPITAL_COMMUNITY): Payer: BC Managed Care – PPO

## 2011-12-16 LAB — CBC
HCT: 26.2 % — ABNORMAL LOW (ref 36.0–46.0)
Hemoglobin: 8.7 g/dL — ABNORMAL LOW (ref 12.0–15.0)
MCHC: 33.2 g/dL (ref 30.0–36.0)
MCV: 86.2 fL (ref 78.0–100.0)
WBC: 3.4 10*3/uL — ABNORMAL LOW (ref 4.0–10.5)

## 2011-12-16 LAB — GLUCOSE, CAPILLARY
Glucose-Capillary: 67 mg/dL — ABNORMAL LOW (ref 70–99)
Glucose-Capillary: 54 mg/dL — ABNORMAL LOW (ref 70–99)

## 2011-12-16 LAB — BASIC METABOLIC PANEL
CO2: 27 mEq/L (ref 19–32)
Chloride: 101 mEq/L (ref 96–112)
Sodium: 137 mEq/L (ref 135–145)

## 2011-12-16 LAB — PROTIME-INR: INR: 1.82 — ABNORMAL HIGH (ref 0.00–1.49)

## 2011-12-16 LAB — PREALBUMIN: Prealbumin: 7.8 mg/dL — ABNORMAL LOW (ref 17.0–34.0)

## 2011-12-16 LAB — MAGNESIUM: Magnesium: 1.4 mg/dL — ABNORMAL LOW (ref 1.5–2.5)

## 2011-12-16 MED ORDER — SODIUM CHLORIDE 0.9 % IV SOLN
INTRAVENOUS | Status: AC
  Administered 2011-12-16: 14:00:00 via INTRAVENOUS

## 2011-12-16 MED ORDER — HYDROCODONE-ACETAMINOPHEN 5-325 MG PO TABS
1.0000 | ORAL_TABLET | Freq: Two times a day (BID) | ORAL | Status: DC | PRN
  Filled 2011-12-16: qty 1

## 2011-12-16 MED ORDER — WARFARIN SODIUM 7.5 MG PO TABS
7.5000 mg | ORAL_TABLET | Freq: Once | ORAL | Status: AC
  Administered 2011-12-16: 7.5 mg via ORAL
  Filled 2011-12-16: qty 1

## 2011-12-16 MED ORDER — THIAMINE HCL 100 MG/ML IJ SOLN
INTRAVENOUS | Status: DC
  Administered 2011-12-16: 18:00:00 via INTRAVENOUS
  Filled 2011-12-16: qty 1000

## 2011-12-16 NOTE — Progress Notes (Signed)
Nutrition Follow-up  Intervention:  1. TNA per pharmacy. 2. Will continue to send patient one Ensure and one [employer] daily.  3. Will continue to encourage PO intake.   Assessment:   Pt TNA has been off from 8/2-8/6. TNA to resume today per pharmacy note, once PICC placed today. Patient reported her appetite and intake have been poor. She reported she is eating about 25% if her meals. She reported she is drinking her nutrition supplements, Ensure and Resource breeze.   Diet Order:  Regular diet and TNA  Meds: Scheduled Meds:   . B-complex with vitamin C  1 tablet Oral Daily  . diphenoxylate-atropine  1 tablet Oral TID  . enoxaparin (LOVENOX) injection  70 mg Subcutaneous Q24H  . feeding supplement  237 mL Oral Daily  . feeding supplement  1 Container Oral Q1400  . fluconazole (DIFLUCAN) IV  200 mg Intravenous Q24H  . LORazepam  1 mg Intravenous Once  . magnesium sulfate 1 - 4 g bolus IVPB  2 g Intravenous Once  .  morphine injection  1 mg Intravenous Once  . multivitamin with minerals  1 tablet Oral Daily  . pantoprazole  40 mg Oral Q1200  . warfarin  7.5 mg Oral ONCE-1800  . warfarin  7.5 mg Oral ONCE-1800  . Warfarin - Pharmacist Dosing Inpatient   Does not apply q1800  . DISCONTD: fluconazole (DIFLUCAN) IV  400 mg Intravenous Q24H   Continuous Infusions:   . sodium chloride 250 mL/hr at 12/16/11 1415  . fat emulsion    . sodium chloride 0.9 % 1,000 mL with potassium chloride 40 mEq infusion 50 mL/hr at 12/16/11 0300  . TPN (CLINIMIX) +/- additives    . DISCONTD: TPN (CLINIMIX) +/- additives     PRN Meds:.acetaminophen, diphenhydrAMINE, morphine injection, ondansetron (ZOFRAN) IV, ondansetron, promethazine, sodium chloride  Labs:  CMP     Component Value Date/Time   NA 137 12/16/2011 0414   NA 140 03/01/2009 1029   K 3.0* 12/16/2011 0414   K 4.2 03/01/2009 1029   CL 101 12/16/2011 0414   CL 104 03/01/2009 1029   CO2 27 12/16/2011 0414   CO2 25 03/01/2009 1029   GLUCOSE 72 12/16/2011 0414   GLUCOSE 82 05/07/2010   BUN 12 12/16/2011 0414   BUN 17 03/01/2009 1029   CREATININE 2.48* 12/16/2011 0414   CREATININE 0.5* 03/01/2009 1029   CALCIUM 5.9* 12/16/2011 0414   CALCIUM 8.7 03/01/2009 1029   PROT 7.7 12/11/2011 0512   PROT 7.9 03/01/2009 1029   ALBUMIN 2.4* 12/11/2011 0512   AST 24 12/11/2011 0512   AST 46* 03/01/2009 1029   ALT 22 12/11/2011 0512   ALKPHOS 126* 12/11/2011 0512   ALKPHOS 143* 03/01/2009 1029   BILITOT 0.5 12/11/2011 0512   BILITOT 0.40 03/01/2009 1029   GFRNONAA 20* 12/16/2011 0414   GFRAA 23* 12/16/2011 0414     Intake/Output Summary (Last 24 hours) at 12/16/11 1433 Last data filed at 12/16/11 0700  Gross per 24 hour  Intake    600 ml  Output   2570 ml  Net  -1970 ml    Weight Status:  164 lb weight up 4 lb over 1 week.   Re-estimated needs:  1600-1750 kcal, 58-72 grams  Nutrition Dx:  Altered GI function, Ongoing.   Goal:  1. PO intake > 50% at meals with positive tolerance.  2. TNA per pharmacy. 3. Meet > 90% of estimated energy needs.   Monitor:  Diet advancement/  tolerance, weights, labs, TNA per pharmacy.    Iven Finn [facility] [phone]

## 2011-12-16 NOTE — Progress Notes (Signed)
Pt CBG 54. Pt states she feels fine.  Dr. Gonzella Lex notified, no orders at this time. Pt to begin TPN at this time.  Will continue to monitor.

## 2011-12-16 NOTE — Progress Notes (Signed)
Subjective: Feeling somewhat better again  More out than in yesterday (had 3 liters of UOP? ).  Creatinine and sodium have stable but K remains low.  Looking to get PICC replaced today.  Asking appropriate questions regarding situation.  Creatinine is stable the last 2 days  Objective Vital signs in last 24 hours: Filed Vitals:   12/15/11 0500 12/15/11 1500 12/15/11 2128 12/16/11 0500  BP: 137/77  146/81 137/74  Pulse: 63 96 60 53  Temp: 98.7 F (37.1 C) 98.4 F (36.9 C) 98.2 F (36.8 C) 98 F (36.7 C)  TempSrc: Oral Oral Oral Oral  Resp: 20 20 16 16   Height:      Weight: 74.8 kg (164 lb 14.5 oz)   74.7 kg (164 lb 10.9 oz)  SpO2: 97% 99% 93% 96%   Weight change: -0.1 kg (-3.5 oz)  Intake/Output Summary (Last 24 hours) at 12/16/11 1310 Last data filed at 12/16/11 0700  Gross per 24 hour  Intake    600 ml  Output   2570 ml  Net  -1970 ml   Labs: Basic Metabolic Panel:  Lab 12/16/11 1610 12/15/11 1838 12/15/11 0906 12/14/11 0432 12/13/11 0600 12/12/11 0610 12/11/11 0512 12/10/11 0645  NA 137 135 137 134* 133* 131* 131* --  K 3.0* 3.2* 2.6* 2.7* 3.3* 3.0* 3.6 --  CL 101 97 97 104 110 109 108 --  CO2 27 30 33* 21 13* 11* 14* --  GLUCOSE 72 74 93 71 76 89 115* --  BUN 12 12 14 18  25* 29* 32* --  CREATININE 2.48* 2.35* 2.41* 2.52* 2.67* 2.49* 2.39* --  ALB -- -- -- -- -- -- -- --  CALCIUM 5.9* 6.0* 6.0* 6.3* 7.0* 6.8* 7.2* --  PHOS 2.1* -- -- -- -- -- 4.1 4.7*   Liver Function Tests:  Lab 12/11/11 0512 12/10/11 0645 12/09/11 1730  AST 24 22 26   ALT 22 20 23   ALKPHOS 126* 115 130*  BILITOT 0.5 0.8 1.2  PROT 7.7 7.5 8.4*  ALBUMIN 2.4* 2.4* 2.7*   No results found for this basename: LIPASE:3,AMYLASE:3 in the last 168 hours No results found for this basename: AMMONIA:3 in the last 168 hours CBC:  Lab 12/16/11 0414 12/15/11 0906 12/14/11 0432 12/13/11 0600 12/10/11 0645  WBC 3.4* 3.4* 4.0 4.0 --  NEUTROABS -- -- -- -- 2.0  HGB 8.7* 8.4* 8.3* 8.8* --  HCT 26.2* 25.6*  24.0* 26.0* --  MCV 86.2 85.0 81.9 82.5 --  PLT 89* 76* 66* 73* --   PT/INR: @labrcntip (inr:5) Cardiac Enzymes: No results found for this basename: CKTOTAL:5,CKMB:5,CKMBINDEX:5,TROPONINI:5 in the last 168 hours CBG:  Lab 12/16/11 0803 12/15/11 1726 12/15/11 1225 12/15/11 0814 12/15/11 0230  GLUCAP 67* 82 75 83 91    Iron Studies: No results found for this basename: IRON:30,TIBC:30,TRANSFERRIN:30,FERRITIN:30 in the last 168 hours  Physical Exam:  Blood pressure 137/74, pulse 53, temperature 98 F (36.7 C), temperature source Oral, resp. rate 16, height 5\' 8"  (1.727 m), weight 74.7 kg (164 lb 10.9 oz), SpO2 96.00%.  Gen: alert, thin adult female, no distress  Skin: no rash, cyanosis  HEENT: EOMI, sclera anicteric, throat slightly dry  Neck: no JVD, loud deep R carotid bruit, no LAN  Chest: clear bilat, good air movement  Heart: regular, no rub or gallop, +2/6 SEM LUSB  Abdomen: soft, nontender, no HSM, no ascites, healed scars from abd surgery  Ext: no LE edema or UE edema  Neuro: alert, Ox3, no focal deficit  UA- 1.013, pH 7, 100 prot, 0-2wbc, TNTC rbc, few bact  UNa- 107  CXR- no acute findings  Renal US- Right kidney 12.9 cm, Left kidney 13.6 cm. +mild increased echogenicity bilat  Impression/Recommendations:  1. Renal failure, acute and/or chronic. Baseline creat was 0.5 last year, but increased to 1.1 from Dec 2012 to May 2013. AKI prerenal vs ATN due to sepsis. Creat mid 2's, good urine output, BP OK. Urine sediment unremarkable w lower tract RBC's, no oxalate crystals and no casts. Less likely oxalosis or AIN due to intermittent cipro she takes for bact overgrowth. Has good BP and low pulse so dont think is exceedingly dry.  This is a confusing picture. Could this all be due to situation (fungemia/volume depletion) possible.  What I would like to do is follow this a little longer to see if will recover on its own before I jump to do a biopsy.  Because function is stable I can  follow as OP.  UOP really increased yesterday (?post injury diuresis) 2. Metabolic acidosis- due to renal failure vs diarrhea, check urine lytes. resolved 3. Fungal bacteremia, PICC-related. Cath is out, on IV antifungals. May be what is responsible for slow recovery? 4. 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 Chronic anemia- unclear cause 6. Hematuria- looks lower tract on microscopy, may need work up if doesn't resolve with treatment of acute illness.  I will follow this up 7. Hypokalemia- on repletion.  Will get better when can get TPN back. I think many things will be better when she can get back on her TPN routine 8. R carotid stenosis, moderate- asymptomatic  Will continue to watch, may eventually need kidney biopsy if does not continue to improve or worsen.  I am comfortable to follow as an OP. Patient tells me that she also gets in addition to TPN- NS as OP, she should continue this practice.  I will give her some extra fluids today.    OP appt will be with me September 5 at 10:15 (gave patient card).  Will also check labs next week and pre appt (to be arranged through our office)  [name] [name] A 12/16/2011, 1:10 PM

## 2011-12-16 NOTE — Progress Notes (Signed)
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 Patient came in to the hospital with acute anemia and dehydration. Hospital course prolonged due to AKI and candida bacteremia.   Assessment/ plan:   pancytopenia  -Patient has history of chronic anemia secondary to chronic disease, she follows with Dr. Welton Flakes  -Patient hemoglobin usually between 8.5-9, she came in with hemoglobin of 7.2.  -Status post transfusion of 4 units since admission  -She had recent 5 negative cards for FOBT on 11/03/2011. She has negative EGD/colonoscopy last year.  -appreciate [facility] eval. i have discussed with her hematologist Dr Welton Flakes who feels her chronic pancytopenia is due to chronic underlying illness and given her anemia at baseline would not need bone marrow bx as inpatient and will plan as outpatient  -gets monthly arenesp injection as outpt  Fever with candidal bacteremia  Patient had temp spike of 103 on 8/1 with both blood cx from 7/31 growing candida started on  micafungin by ID Dr. Luciana Axe, which is now changed to fluconazole .  recommends 14 days of treatment, ( day 5) recommends ophthalmology evaluation as outpatient after completion of treatment. -PICC line removed.  -repeat blood cx negative. Ordered for IR guided PICC placement ( planned for today . ) -2 D echo shows mildly reduced LV function with EF 40-45% and akinesis of apical myocardium . No vegetation noted. Patient currently is volume depleted.   Short gut syndrome  -Patient is on total parenteral nutrition secondary to short gut (reported 1 foot left of her small bowel).  -Patient mentioned of missing out on nutrients and MVs Due to shortage in the injectable multivitamins.  -holding off  on TPN as PICC is removed . Continue IV fluids for now until another PICC  ( likely today)  Acute renal failure / ATN  initially thought likely secondary to dehydration. Renal USG unremarkable  -as per GI could possibly be associated with TPN as well.  -appreciate renal recs. Possible prerenal AKI vs ATN due to sepsis  -low bicarb also noted and given aggressive hydration with 3 amps bicarb.  on 8/3 fluid increased to 250 cc/ hr ( D5 with bicarb after 1 L iv NS bolus given)  Acidosis improved. Urine output quite good and appears to be  in the diuresis phase of ATN. -currently on NS. -being followed closely by renal. Plan for renal biopsy if not improved.   Hyponatremia  - likely secondary to dehydration and acute renal failure.  - improved with fluids .    Hypoglycemia  frequently running in 60s but asymptomatic  should resolve once TPN started  Hypokalemia / hypomagnesemia Repleted. Hopefully should be better corrected after TPN started  Hypocalcemia Ca of 5.9 today. given low alb , corrected would be 7 . She is asymptomatic and hopefully should improve with TPN starting today  Diarrhea  : chronic  on lomotil   Hematuria  -Patient has painless hematuria occasinally but stable  -needs w/up if persistent    DVT prophylaxis  On coumadin per pharmacy   Code Status:full   Family Communication: updated daily   Disposition Plan: home once PICC placed, TPN resuled and  electrolytes improved  Consultants:  [facility]  [facility] ( renal)  [facility] ( ID) Procedures:  none Antibiotics:  On  IV micofungin for 2 weeks ( started on 8/2) switched to IV fluconazole today ( will complete 2 weeks course on 8/16)  HPI/Subjective:  Feels better overall. still has diarrhea  HPI/Subjective: Denies any symptoms  Objective: Filed Vitals:   12/15/11 0500 12/15/11 1500 12/15/11 2128 12/16/11 0500  BP: 137/77  146/81 137/74  Pulse: 63 96 60 53  Temp: 98.7 F (37.1 C) 98.4 F (36.9 C)  98.2 F (36.8 C) 98 F (36.7 C)  TempSrc: Oral Oral Oral Oral  Resp: 20 20 16 16   Height:      Weight: 74.8 kg (164 lb 14.5 oz)   74.7 kg (164 lb 10.9 oz)  SpO2: 97% 99% 93% 96%    Intake/Output Summary (Last 24 hours) at 12/16/11 1345 Last data filed at 12/16/11 0700  Gross per 24 hour  Intake    600 ml  Output   2570 ml  Net  -1970 ml    Exam:  General: Elderly female in NAD  HEENT: no pallor, dry oral mucosa  CVS: N S1&S2, no murmurs  Respiratory: clear b/l no added sounds  Abdomen: old laprotomy scar , soft, NT, ND, BS+  Ext: Warm, no edema  Cns: aaox3    Data Reviewed: Basic Metabolic Panel:  Lab 12/16/11 1610 12/15/11 1838 12/15/11 0906 12/14/11 0432 12/13/11 0600 12/11/11 0512 12/10/11 0645  NA 137 135 137 134* 133* -- --  K 3.0* 3.2* 2.6* 2.7* 3.3* -- --  CL 101 97 97 104 110 -- --  CO2 27 30 33* 21 13* -- --  GLUCOSE 72 74 93 71 76 -- --  BUN 12 12 14 18  25* -- --  CREATININE 2.48* 2.35* 2.41* 2.52* 2.67* -- --  CALCIUM 5.9* 6.0* 6.0* 6.3* 7.0* -- --  MG 1.4* -- 1.1* -- -- 1.8 2.1  PHOS 2.1* -- -- -- -- 4.1 4.7*   Liver Function Tests:  Lab 12/11/11 0512 12/10/11 0645 12/09/11 1730  AST 24 22 26   ALT 22 20 23   ALKPHOS 126* 115 130*  BILITOT 0.5 0.8 1.2  PROT 7.7 7.5 8.4*  ALBUMIN 2.4* 2.4* 2.7*   No results found for this basename: LIPASE:5,AMYLASE:5 in the last 168 hours No results found for this basename: AMMONIA:5 in the last 168 hours CBC:  Lab 12/16/11 0414 12/15/11 0906 12/14/11 0432 12/13/11 0600 12/12/11 0610 12/10/11 0645  WBC 3.4* 3.4* 4.0 4.0 3.6* --  NEUTROABS -- -- -- -- -- 2.0  HGB 8.7* 8.4* 8.3* 8.8* 6.5* --  HCT 26.2* 25.6* 24.0* 26.0* 19.7* --  MCV 86.2 85.0 81.9 82.5 83.5 --  PLT 89* 76* 66* 73* 68* --   Cardiac Enzymes: No results found for this basename: CKTOTAL:5,CKMB:5,CKMBINDEX:5,TROPONINI:5 in the last 168 hours BNP (last 3 results) No results found for this basename: PROBNP:3 in the last 8760 hours CBG:  Lab  12/16/11 0803 12/15/11 1726 12/15/11 1225 12/15/11 0814 12/15/11 0230  GLUCAP 67* 82 75 83 91    Recent Results (from the past 240 hour(s))  CULTURE, BLOOD (ROUTINE X 2)     Status: Normal   Collection Time   12/09/11  2:15 PM      Component Value Range Status Comment   Specimen Description BLOOD RIGHT ARM   Final    Special Requests BOTTLES DRAWN AEROBIC AND ANAEROBIC 5CC   Final    Culture  Setup Time 12/10/2011 00:49   Final    Culture     Final    Value: CANDIDA  FAMATA     Note: Gram Stain Report Called to,Read Back By and Verified With: LORI HARRIS @0330  ON 12/12/2011 BY MCLET   Report Status 12/15/2011 FINAL   Final   CULTURE, BLOOD (ROUTINE X 2)     Status: Normal   Collection Time   12/09/11  2:25 PM      Component Value Range Status Comment   Specimen Description Blood   Final    Special Requests Normal   Final    Culture  Setup Time 12/10/2011 00:25   Final    Culture     Final    Value: CANDIDA PARAPSILOSIS     Note: Gram Stain Report Called to,Read Back By and Verified With: LAURIE HARRIS @ 1923 ON 12/11/11 BY GOLLD   Report Status 12/14/2011 FINAL   Final   CULTURE, BLOOD (ROUTINE X 2)     Status: Normal (Preliminary result)   Collection Time   12/13/11  5:50 AM      Component Value Range Status Comment   Specimen Description BLOOD LEFT HAND   Final    Special Requests BOTTLES DRAWN AEROBIC ONLY 5 CC   Final    Culture  Setup Time 12/13/2011 11:13   Final    Culture     Final    Value:        BLOOD CULTURE RECEIVED NO GROWTH TO DATE CULTURE WILL BE HELD FOR 5 DAYS BEFORE ISSUING A FINAL NEGATIVE REPORT   Report Status PENDING   Incomplete   CULTURE, BLOOD (ROUTINE X 2)     Status: Normal (Preliminary result)   Collection Time   12/13/11  6:00 AM      Component Value Range Status Comment   Specimen Description BLOOD LEFT HAND   Final    Special Requests BOTTLES DRAWN AEROBIC AND ANAEROBIC 5 CC EACH   Final    Culture  Setup Time 12/13/2011 11:13   Final    Culture      Final    Value:        BLOOD CULTURE RECEIVED NO GROWTH TO DATE CULTURE WILL BE HELD FOR 5 DAYS BEFORE ISSUING A FINAL NEGATIVE REPORT   Report Status PENDING   Incomplete      Studies: Dg Chest 2 View  12/09/2011  *RADIOLOGY REPORT*  Clinical Data: Fever and weakness.  Ex-smoker.  CHEST - 2 VIEW  Comparison: CT of 10/24/2011 and plain film of 01/17/2011.  Findings: A left-sided PICC line which is unchanged and terminates at the low SVC. Midline trachea.  Borderline cardiomegaly.  Age advanced aortic atherosclerosis. No pleural effusion or pneumothorax.  The reticular nodular opacities described on the prior plain film are improved to resolved.  Mild interstitial prominence remains, and is likely related to the clinical history of  prior smoking. No lobar consolidation.  IMPRESSION: Interstitial thickening, likely related to prior smoking / chronic bronchitis.  No evidence of acute superimposed pneumonia.  Original Report Authenticated By: Consuello Bossier, M.D.   US Renal  12/09/2011  *RADIOLOGY REPORT*  Clinical Data: Hematuria.  History of diabetes.  RENAL/URINARY TRACT ULTRASOUND COMPLETE  Comparison:  Abdominal ultrasound 10/29/2011.  Findings:  Right Kidney:  Mild diffuse increased echogenicity of the renal parenchyma is nonspecific.  No focal cystic or solid renal lesions. 12.9 cm in length.  No hydronephrosis. No definite calculi.  Left Kidney:  Mild diffuse increased echogenicity of the renal parenchyma is nonspecific.  No focal cystic or solid renal lesions. 13.6 cm in  length.  No hydronephrosis. No definite calculi.  Bladder:  Well distended without focal wall abnormalities.  IMPRESSION: 1.  Mild diffusely increased echogenicity of the the renal parenchyma bilaterally is nonspecific, but could suggest underlying medical renal disease. 2.  No focal cystic or solid renal lesions, and no definite evidence of calculi within the collecting systems.  Original Report Authenticated By: Florencia Reasons,  M.D.    Scheduled Meds:   . B-complex with vitamin C  1 tablet Oral Daily  . diphenoxylate-atropine  1 tablet Oral TID  . enoxaparin (LOVENOX) injection  70 mg Subcutaneous Q24H  . feeding supplement  237 mL Oral Daily  . feeding supplement  1 Container Oral Q1400  . fluconazole (DIFLUCAN) IV  200 mg Intravenous Q24H  . fluconazole (DIFLUCAN) IV  800 mg Intravenous Once  . LORazepam  1 mg Intravenous Once  . magnesium sulfate 1 - 4 g bolus IVPB  2 g Intravenous Once  .  morphine injection  1 mg Intravenous Once  . multivitamin with minerals  1 tablet Oral Daily  . pantoprazole  40 mg Oral Q1200  . warfarin  7.5 mg Oral ONCE-1800  . warfarin  7.5 mg Oral ONCE-1800  . Warfarin - Pharmacist Dosing Inpatient   Does not apply q1800  . DISCONTD: fluconazole (DIFLUCAN) IV  400 mg Intravenous Q24H   Continuous Infusions:   . sodium chloride    . fat emulsion    . sodium chloride 0.9 % 1,000 mL with potassium chloride 40 mEq infusion 50 mL/hr at 12/16/11 0300  . TPN (CLINIMIX) +/- additives         Time spent: 30 minutes    Corleen Otwell  [facility] Pager [phone]. If 8PM-8AM, please contact night-coverage at [email or web address], password [facility] 12/16/2011, 1:45 PM  LOS: 8 days

## 2011-12-16 NOTE — Progress Notes (Signed)
Hypo Hypoglycemic Event  CBG: 61  Treatment: 15 GM carbohydrate snack  Symptoms: None  Follow-up CBG: Time:1220 CBG Result:67 Possible Reasons for Event: short bowel syndrome  Comments/MD notified Dhungel MD notified of second CBG result, pt asymptomatic, will continue to monitor, no further treatment at this time    Cathie Olden  Remember to initiate Hypoglycemia Order Set & complete

## 2011-12-16 NOTE — Progress Notes (Signed)
CRITICAL VALUE ALERT  Critical value received: calcium Date of notification:  12/16/11  Time of notification:  0508  Critical value read back:yes  Nurse who received alert:  Julious Oka, RN  MD notified (1st page): Donnamarie Poag  Time of first page:  [phone]

## 2011-12-16 NOTE — Progress Notes (Signed)
INFECTIOUS DISEASE PROGRESS NOTE  ID: [name] [name] is a 61 y.o. 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  Came in with fever and abdominal pain.  Blood cultures grew out Candida parapsolosis, 2/2 and now Candida famata.  Repeat cultures 8/3 negative to date.    Subjective: Doing better  Abtx:  Anti-infectives     Start     Dose/Rate Route Frequency Ordered Stop   12/16/11 1400   fluconazole (DIFLUCAN) IVPB 400 mg  Status:  Discontinued        400 mg 200 mL/hr over 60 Minutes Intravenous Every 24 hours 12/15/11 1303 12/15/11 1525   12/16/11 1400   fluconazole (DIFLUCAN) IVPB 200 mg        200 mg 100 mL/hr over 60 Minutes Intravenous Every 24 hours 12/15/11 1525     12/15/11 1400   fluconazole (DIFLUCAN) IVPB 800 mg        800 mg 400 mL/hr over 60 Minutes Intravenous  Once 12/15/11 1303 12/15/11 1421   12/12/11 1800   micafungin (MYCAMINE) 100 mg in sodium chloride 0.9 % 100 mL IVPB  Status:  Discontinued        100 mg 100 mL/hr over 1 Hours Intravenous Daily after supper 12/12/11 1423 12/15/11 1303   12/12/11 1300   micafungin (MYCAMINE) 100 mg in sodium chloride 0.9 % 100 mL IVPB  Status:  Discontinued        100 mg 100 mL/hr over 1 Hours Intravenous Daily 12/12/11 1216 12/12/11 1423   12/11/11 2245   fluconazole (DIFLUCAN) IVPB 200 mg  Status:  Discontinued        200 mg 100 mL/hr over 60 Minutes Intravenous Daily at bedtime 12/11/11 2229 12/12/11 1215          Medications: I have reviewed the patient's current medications.  Objective: Vital signs in last 24 hours: Temp:  [98 F (36.7 C)-98.4 F (36.9 C)] 98 F (36.7 C) (08/06 0500) Pulse Rate:  [53-96] 53  (08/06 0500) Resp:  [16-20] 16  (08/06 0500) BP: (137-146)/(74-81) 137/74 mmHg (08/06 0500) SpO2:  [93 %-99 %] 96 % (08/06 0500) Weight:  [164 lb 10.9 oz (74.7 kg)] 164 lb 10.9 oz (74.7 kg) (08/06 0500)   General appearance: alert, cooperative and no  distress Resp: clear to auscultation bilaterally Cardio: regular rate and rhythm, S1, S2 normal, no murmur, click, rub or gallop  Lab Results  Basename 12/16/11 0414 12/15/11 1838 12/15/11 0906  WBC 3.4* -- 3.4*  HGB 8.7* -- 8.4*  HCT 26.2* -- 25.6*  NA 137 135 --  K 3.0* 3.2* --  CL 101 97 --  CO2 27 30 --  BUN 12 12 --  CREATININE 2.48* 2.35* --  GLU -- -- --   Liver Panel No results found for this basename: PROT:2,ALBUMIN:2,AST:2,ALT:2,ALKPHOS:2,BILITOT:2,BILIDIR:2,IBILI:2 in the last 72 hours Sedimentation Rate No results found for this basename: ESRSEDRATE in the last 72 hours C-Reactive Protein No results found for this basename: CRP:2 in the last 72 hours  Microbiology: Recent Results (from the past 240 hour(s))  CULTURE, BLOOD (ROUTINE X 2)     Status: Normal   Collection Time   12/09/11  2:15 PM      Component Value Range Status Comment   Specimen Description BLOOD RIGHT ARM   Final    Special Requests BOTTLES DRAWN AEROBIC AND ANAEROBIC 5CC   Final    Culture  Setup Time 12/10/2011 00:49  Final    Culture     Final    Value: CANDIDA FAMATA     Note: Gram Stain Report Called to,Read Back By and Verified With: [name] [name] @0330  ON 12/12/2011 BY MCLET   Report Status 12/15/2011 FINAL   Final   CULTURE, BLOOD (ROUTINE X 2)     Status: Normal   Collection Time   12/09/11  2:25 PM      Component Value Range Status Comment   Specimen Description Blood   Final    Special Requests Normal   Final    Culture  Setup Time 12/10/2011 00:25   Final    Culture     Final    Value: CANDIDA PARAPSILOSIS     Note: Gram Stain Report Called to,Read Back By and Verified With: [name] [name] @ 1923 ON 12/11/11 BY GOLLD   Report Status 12/14/2011 FINAL   Final   CULTURE, BLOOD (ROUTINE X 2)     Status: Normal (Preliminary result)   Collection Time   12/13/11  5:50 AM      Component Value Range Status Comment   Specimen Description BLOOD LEFT HAND   Final    Special Requests BOTTLES  DRAWN AEROBIC ONLY 5 CC   Final    Culture  Setup Time 12/13/2011 11:13   Final    Culture     Final    Value:        BLOOD CULTURE RECEIVED NO GROWTH TO DATE CULTURE WILL BE HELD FOR 5 DAYS BEFORE ISSUING A FINAL NEGATIVE REPORT   Report Status PENDING   Incomplete   CULTURE, BLOOD (ROUTINE X 2)     Status: Normal (Preliminary result)   Collection Time   12/13/11  6:00 AM      Component Value Range Status Comment   Specimen Description BLOOD LEFT HAND   Final    Special Requests BOTTLES DRAWN AEROBIC AND ANAEROBIC 5 CC EACH   Final    Culture  Setup Time 12/13/2011 11:13   Final    Culture     Final    Value:        BLOOD CULTURE RECEIVED NO GROWTH TO DATE CULTURE WILL BE HELD FOR 5 DAYS BEFORE ISSUING A FINAL NEGATIVE REPORT   Report Status PENDING   Incomplete     Studies/Results: No results found.   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  Now on IV fluconazole.  This will cover both Candida species well.  She should get 2 weeks total from 8/3-8/16.   -echo with no concerning signs of endocarditis.   -she should get an eye exam to r/o endophthalmitis prior to stopping the fluconazole on 8/16 (outpatient ok).   -PICC today  [name] [name] Infectious Diseases 12/16/2011, 2:01 PM

## 2011-12-16 NOTE — Progress Notes (Signed)
PARENTERAL NUTRITION CONSULT NOTE - Follow Up  Pharmacy Consult for TNA Indication: Short Gut Syndrome  Allergies  Allergen Reactions  . Iohexol Itching    IVP Dye ok if taken with benadryl  . Penicillins Itching    Patient Measurements: Height: 5\' 8"  (172.7 cm) Weight: 164 lb 10.9 oz (74.7 kg) IBW/kg (Calculated) : 63.9  Adjusted Body Weight: 67.4kg Usual Weight: ~155 lbs (70.4kg) - per pt report  Vital Signs: Temp: 98 F (36.7 C) (08/06 0500) Temp src: Oral (08/06 0500) BP: 137/74 mmHg (08/06 0500) Pulse Rate: 53  (08/06 0500) Intake/Output from previous day: 08/05 0701 - 08/06 0700 In: 840 [P.O.:240; I.V.:600] Out: 3170 [Urine:3170] I/O: +2.4L Intake/Output from this shift:    Labs:  [facility] 12/16/11 0414 12/15/11 0906 12/14/11 0432  WBC 3.4* 3.4* 4.0  HGB 8.7* 8.4* 8.3*  HCT 26.2* 25.6* 24.0*  PLT 89* 76* 66*  APTT -- -- --  INR 1.82* 1.62* 1.77*     Basename 12/16/11 0414 12/15/11 1838 12/15/11 0906  NA 137 135 137  K 3.0* 3.2* 2.6*  CL 101 97 97  CO2 27 30 33*  GLUCOSE 72 74 93  BUN 12 12 14   CREATININE 2.48* 2.35* 2.41*  LABCREA -- -- --  CREAT24HRUR -- -- --  CALCIUM 5.9* 6.0* 6.0*  MG 1.4* -- 1.1*  PHOS 2.1* -- --  PROT -- -- --  ALBUMIN -- -- --  AST -- -- --  ALT -- -- --  ALKPHOS -- -- --  BILITOT -- -- --  BILIDIR -- -- --  IBILI -- -- --  PREALBUMIN -- -- --  TRIG -- -- --  CHOLHDL -- -- --  CHOL -- -- --   Estimated Creatinine Clearance: 24 ml/min (by C-G formula based on Cr of 2.48).  Corrected Ca: 7.18 (using albumin from 8/1)   Basename 12/15/11 1726 12/15/11 1225 12/15/11 0814  GLUCAP 82 75 83    Medical History: Past Medical History  Diagnosis Date  . Short bowel syndrome   . Abnormal LFTs   . Vitamin d deficiency   . Personal history of colonic polyps   . Thrombophilia   . Diabetes mellitus     resolved after weight loss  . Obesity     prior to weight loss  . Allergic rhinosinusitis   . At risk for  dental problems   . Fracture of left clavicle   . Osteoporosis   . Anemia of chronic disease   . Renal insufficiency   . Atypical nevus   . Pathologic fracture of neck of femur   . Pancytopenia 10/07/2011  . Small bowel ischemia   . Superior mesenteric artery stenosis   . Bacterial overgrowth syndrome     Medications:  Scheduled:     . B-complex with vitamin C  1 tablet Oral Daily  . diphenoxylate-atropine  1 tablet Oral TID  . enoxaparin (LOVENOX) injection  70 mg Subcutaneous Q24H  . feeding supplement  237 mL Oral Daily  . feeding supplement  1 Container Oral Q1400  . fluconazole (DIFLUCAN) IV  200 mg Intravenous Q24H  . fluconazole (DIFLUCAN) IV  800 mg Intravenous Once  . LORazepam  1 mg Intravenous Once  . magnesium sulfate 1 - 4 g bolus IVPB  2 g Intravenous Once  .  morphine injection  1 mg Intravenous Once  . multivitamin with minerals  1 tablet Oral Daily  . pantoprazole  40 mg Oral Q1200  . potassium chloride  40 mEq Oral Once  . warfarin  7.5 mg Oral ONCE-1800  . Warfarin - Pharmacist Dosing Inpatient   Does not apply q1800  . DISCONTD: enoxaparin (LOVENOX) injection  70 mg Subcutaneous Q24H  . DISCONTD: fluconazole (DIFLUCAN) IV  400 mg Intravenous Q24H  . DISCONTD: micafungin (MYCAMINE) IV  100 mg Intravenous QPC supper  . DISCONTD: warfarin  4 mg Oral ONCE-1800   Infusions:     . fat emulsion    . sodium chloride 0.9 % 1,000 mL with potassium chloride 40 mEq infusion 50 mL/hr at 12/16/11 0300  . TPN (CLINIMIX) +/- additives    . DISCONTD: sodium chloride    . DISCONTD: dextrose 5 % 1,000 mL with sodium bicarbonate 150 mEq infusion 250 mL/hr at 12/15/11 0904    Insulin Requirements in the past 24 hours:  No SSI required.  Hypoglycemic episodes off TNA.  cBGs 75-91   Nutritional Goals:  Per RD assessment 7/30: Kcal: 1600-1750 kcal, Protein: 58-72g , Fluid: 2.5 L  Goal TNA (adjusting per RD recommendations) = Clinimx E 4.25/25 - infused over  10-12 hours plus IV lipids over 10-12hr on MWF to provide 61g Protein/day, 1948 kcal/day on lipid days, 1468 on non-lipid days, for an average of 1674 kcal/day.  Current Nutrition:  Pt OFF TNA 8/2-8/6 d/t to PICC line removal.  Ensure Complete and Resource Breeze ordered, but pt has refused all except one dose. On Regular diet, PO intake recorded but pt not likely absorbing (see assessment below)  Pt on chronic home TNA.    Plan for PICC line to be replaced and cyclic TNA to be resumed today 8/6.  Assessment: 61 yo F admitted 12/08/11 with CC: low grade fever and inability to eat for several months, found to be in worsening renal failure, ultimately admitted for dehydration/renal failure. 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 She is on chronic TNA at home (for 7 years, per pt report) infused via PICC (placed 08/13/11 at [facility] per report) Pt also has hx of abnormal LFTs, DM ("resolved after weight loss"), erosive esophagitis, anemia of chronic dz, and renal insufficiency. She is on abx frequently for bacteria overgrowth in bowel. TNA resumed 7/30 inpatient.  Pharmacist had lengthy discussion with patient on 7/30, who reports that she's been on TNA for 7 years. She reports that she's allowed to eat "whatever she wants" at home in addition to the TNA, but the food "usually passes right though her" (her daughter estimates within 30 mins). For the past couple weeks, patient reports having no appetite; however, she mentions she is hungry this morning. Spoke with a family member over the phone (granddaughter) who read her home TNA label to me, which indicates: 2100 ml total cycled over 10 hours with a 1 hour ramp up and 1 hour ramp down. TNA bag ( ) contains: 250g Dextrose, 100g Travasol 10%, 45g Intralipids 30%, MVI, TE, Zinc, Selenium, and various electrolytes. Per patient report, TNA bag also contains B-complex vitamins and  folic acid, however this information was not relayed to me over the phone. Bag does not contain any insulin. This home TNA provides approx 45g Protein/day and 1675 Kcal/day. Patient is agreeable to using [facility] supplied TNA and is ok if TNA is cycled over 12 hours instead of usual 10 hours.  TNA goals adjusted to RD's recommendations inpatient: kcal goal remains the same; however, RD recommends a higher protein goal.  Advancing to higher protein supplementation is limited by the patient's current renal failure.  Labs: Lytes: Hyponatremia resolved. K, Magnesium remains low despite replacement 8/5.  Phosphate borderline low.   Corrected Ca:  7.18 (using albumin from 8/1) Renal: SCr increased overnight. CrCl ~ 15ml/mn.    LFTs: wnl, Alk Phos slightly elevated CBGs: hypoglycemic events off TNA Prealbumin: 12.3 (7/31), TNA labs d/c'd 8/5, will restart protocol orders.   GI PPx: PO Protonix IVF: NS with K at 50 ml/hr per MD 7/30 Blood Cultures:  Candida parapsilosis.   8/3 Blood Cultures:  NGTD   Plan: 1) Restart Clinimix E 4.25/25, cycled over 12 hours (6p-6a): 18ml/hr x1hr, 86ml/hr x 10hr, 69ml/hr x1 hour (off 6a-6p).   2) IV lipids, MVI, and TE on MWF only due to national back order. Will continue to put MVI (when possible) and TE in TNA due to short gut syndrome and questionable PO absorption (pt has active order for PO MVI). MVI on backorder and may not be available on a regular basis.   Thiamine and Folic acid will be added to TNA daily per MD request.    3) SSI sensitive scale revolving around cyclic admin times (due to hx of DM, even though reportedly resolved).  4) BMET in AM. TNA labs qMon/Thurs. 5)  Follow up potassium, magnesium replacement (per protocol, pharmacy unable to replace when CrCl < 29ml/min)  Haynes Hoehn, PharmD 12/16/2011 7:06 AM  Pager: [phone]

## 2011-12-16 NOTE — Progress Notes (Signed)
CRITICAL VALUE ALERT  Critical value received:  Calcium  Date of notification:  12/15/2011  Time of notification:  1949  Critical value read back:yes  Nurse who received alert:  Julious Oka, RN  MD notified (1st page):  Donnamarie Poag, NP  Time of first page:  [phone]

## 2011-12-16 NOTE — Progress Notes (Signed)
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 Iohexol Itching    IVP Dye ok if taken with benadryl  . Penicillins Itching   Patient Measurements: Height: 5\' 8"  (172.7 cm) Weight: 164 lb 10.9 oz (74.7 kg) IBW/kg (Calculated) : 63.9   Vital Signs: Temp: 98 F (36.7 C) (08/06 0500) Temp src: Oral (08/06 0500) BP: 137/74 mmHg (08/06 0500) Pulse Rate: 53  (08/06 0500)  Labs:  Basename 12/16/11 0414 12/15/11 1838 12/15/11 0906 12/14/11 0432  HGB 8.7* -- 8.4* --  HCT 26.2* -- 25.6* 24.0*  PLT 89* -- 76* 66*  APTT -- -- -- --  LABPROT 21.4* -- 19.5* 20.9*  INR 1.82* -- 1.62* 1.77*  HEPARINUNFRC -- -- -- --  CREATININE 2.48* 2.35* 2.41* --  CKTOTAL -- -- -- --  CKMB -- -- -- --  TROPONINI -- -- -- --   Estimated Creatinine Clearance: 24 ml/min (by C-G formula based on Cr of 2.48).  Medications:  Prescriptions prior to admission  Medication Sig Dispense Refill  . ADULT TPN Inject 2,100 mLs into the vein continuous. Infuse over 10 hours with 1 hour ramp up and 1 hour ramp down. Total bag per day provides: 250g Dextrose 70%, 100g Travasol 10%, 45g Intralipids 30%, various electrolytes, MVI, TE, Zinc, selenium.      . calcium carbonate (TUMS EX) 750 MG chewable tablet Chew 2 tablets by mouth 3 (three) times daily. Calcium      . ciprofloxacin (CIPRO) 500 MG tablet TAKE 1 TABLET BY MOUTH TWICE DAILY FOR 1 WEEK AT A TIME INTERMITTENTLY AS DIRECTED FOR BACTERIAL OVERGROWTH  28 tablet  4  . diphenhydrAMINE (BENADRYL) 25 mg capsule Take 25 mg by mouth every 6 (six) hours as needed. For itching/sleep.      . Eflornithine HCl (VANIQA) 13.9 % cream Apply 1 application topically 2 (two) times daily with a meal. Applied to facial hair.      . Multiple Vitamin (MULTIVITAMIN WITH MINERALS) TABS Take 1 tablet by mouth daily.      Marland Kitchen  omeprazole (PRILOSEC) 40 MG capsule Take 40 mg by mouth daily.      . Parenteral Electrolytes (TPN ELECTROLYTES IV) Inject 1 each into the vein at bedtime.       . promethazine (PHENERGAN) 25 MG tablet Take 25 mg by mouth every 6 (six) hours as needed. For nausea.      . ursodiol (ACTIGALL) 300 MG capsule TAKE 1 CAPSULE BY MOUTH TWICE DAILY  60 capsule  5  . Vitamin D, Ergocalciferol, (DRISDOL) 50000 UNITS CAPS ALTERNATE TAKING 1 CAPSULE BY MOUTH EVERY OTHER DAY AND 2 CAPSULES BY MOUTH EVERY OTHER DAY  45 capsule  1  . warfarin (COUMADIN) 10 MG tablet Take 1 tablet (10 mg total) by mouth daily.  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 Welton Flakes.   Home dose reported as Warfarin 10 mg daily.   INR >4 on admission. Warfarin restarted 7/30 after INR <2 after transfusions. Lovenox d/c'd 8/1, restarted 8/5 due to unstable INR. Home 10 mg dose appears too high.    Current INR subtherapeutic, rising.    SCr elevated again overnight  H/H stable.  No bleeding noted.  Chronic thrombocytonpenia.  Pt on concurrent fluconazole therapy which can cause elevations in INR.  Will f/u closely.     Goal of Therapy:  INR 2.5-3. Monitor platelets daily.   Plan:  Repeat Warfarin 7.5 mg po x 1  Continue LMWH 70mg  SQ q 24 hours  Daily PT/INR  Wynonia Hazard PharmD Pager [phone] 12/16/2011 7:37 AM

## 2011-12-16 NOTE — Procedures (Signed)
RUE PICC

## 2011-12-17 DIAGNOSIS — D689 Coagulation defect, unspecified: Secondary | ICD-10-CM

## 2011-12-17 LAB — GLUCOSE, CAPILLARY
Glucose-Capillary: 112 mg/dL — ABNORMAL HIGH (ref 70–99)
Glucose-Capillary: 75 mg/dL (ref 70–99)
Glucose-Capillary: 69 mg/dL — ABNORMAL LOW (ref 70–99)
Glucose-Capillary: 60 mg/dL — ABNORMAL LOW (ref 70–99)
Glucose-Capillary: 92 mg/dL (ref 70–99)

## 2011-12-17 LAB — BASIC METABOLIC PANEL
BUN: 15 mg/dL (ref 6–23)
CO2: 25 mEq/L (ref 19–32)
Chloride: 102 mEq/L (ref 96–112)
Creatinine, Ser: 2.41 mg/dL — ABNORMAL HIGH (ref 0.50–1.10)
Glucose, Bld: 81 mg/dL (ref 70–99)

## 2011-12-17 LAB — CBC
HCT: 28.7 % — ABNORMAL LOW (ref 36.0–46.0)
Hemoglobin: 9.4 g/dL — ABNORMAL LOW (ref 12.0–15.0)
MCH: 28.6 pg (ref 26.0–34.0)
MCHC: 32.8 g/dL (ref 30.0–36.0)
MCV: 87.2 fL (ref 78.0–100.0)

## 2011-12-17 LAB — PROTIME-INR
INR: 2.96 — ABNORMAL HIGH (ref 0.00–1.49)
Prothrombin Time: 31.3 s — ABNORMAL HIGH (ref 11.6–15.2)

## 2011-12-17 MED ORDER — FLUCONAZOLE IN SODIUM CHLORIDE 200-0.9 MG/100ML-% IV SOLN: 200.0000 mg | INTRAVENOUS | Status: AC

## 2011-12-17 MED ORDER — FAT EMULSION 20 % IV EMUL
250.0000 mL | INTRAVENOUS | Status: DC
  Administered 2011-12-17: 250 mL via INTRAVENOUS
  Filled 2011-12-17 (×2): qty 250

## 2011-12-17 MED ORDER — WARFARIN 0.5 MG HALF TABLET
0.5000 mg | ORAL_TABLET | Freq: Once | ORAL | Status: AC
  Administered 2011-12-17: 0.5 mg via ORAL
  Filled 2011-12-17: qty 1

## 2011-12-17 MED ORDER — ZINC TRACE METAL 1 MG/ML IV SOLN
INTRAVENOUS | Status: DC
  Administered 2011-12-17: 17:00:00 via INTRAVENOUS
  Filled 2011-12-17: qty 1000

## 2011-12-17 MED ORDER — WARFARIN SODIUM 1 MG PO TABS: 1.0000 mg | ORAL_TABLET | ORAL | Status: DC

## 2011-12-17 NOTE — Progress Notes (Signed)
Subjective: Feeling somewhat better again  Did better job of hydrating yesterday.  Has new PICC and given TPN last night.  Labs stable Objective Vital signs in last 24 hours: Filed Vitals:   12/16/11 1700 12/16/11 2103 12/17/11 0101 12/17/11 0514  BP: 151/90 132/80 127/76 147/87  Pulse: 69 59 54 60  Temp: 97.9 F (36.6 C) 99.1 F (37.3 C) 98.2 F (36.8 C) 97.9 F (36.6 C)  TempSrc: Oral Oral Oral Oral  Resp: 17 18 16 16   Height:      Weight:    75.1 kg (165 lb 9.1 oz)  SpO2: 97% 91% 95% 97%   Weight change: 0.4 kg (14.1 oz)  Intake/Output Summary (Last 24 hours) at 12/17/11 1158 Last data filed at 12/17/11 0925  Gross per 24 hour  Intake 3092.33 ml  Output   2075 ml  Net 1017.33 ml   Labs: Basic Metabolic Panel:  Lab 12/17/11 5409 12/16/11 0414 12/15/11 1838 12/15/11 0906 12/14/11 0432 12/13/11 0600 12/12/11 0610 12/11/11 0512  NA 136 137 135 137 134* 133* 131* --  K 3.4* 3.0* 3.2* 2.6* 2.7* 3.3* 3.0* --  CL 102 101 97 97 104 110 109 --  CO2 25 27 30  33* 21 13* 11* --  GLUCOSE 81 72 74 93 71 76 89 --  BUN 15 12 12 14 18  25* 29* --  CREATININE 2.41* 2.48* 2.35* 2.41* 2.52* 2.67* 2.49* --  ALB -- -- -- -- -- -- -- --  CALCIUM 6.1* 5.9* 6.0* 6.0* 6.3* 7.0* 6.8* --  PHOS -- 2.1* -- -- -- -- -- 4.1   Liver Function Tests:  Lab 12/11/11 0512  AST 24  ALT 22  ALKPHOS 126*  BILITOT 0.5  PROT 7.7  ALBUMIN 2.4*   No results found for this basename: LIPASE:3,AMYLASE:3 in the last 168 hours No results found for this basename: AMMONIA:3 in the last 168 hours CBC:  Lab 12/17/11 0630 12/16/11 0414 12/15/11 0906 12/14/11 0432  WBC 4.3 3.4* 3.4* 4.0  NEUTROABS -- -- -- --  HGB 9.4* 8.7* 8.4* 8.3*  HCT 28.7* 26.2* 25.6* 24.0*  MCV 87.2 86.2 85.0 81.9  PLT 104* 89* 76* 66*   PT/INR: @labrcntip (inr:5) Cardiac Enzymes: No results found for this basename: CKTOTAL:5,CKMB:5,CKMBINDEX:5,TROPONINI:5 in the last 168 hours CBG:  Lab 12/17/11 1124 12/17/11 0719 12/16/11 2101  12/16/11 1724 12/16/11 1225  GLUCAP 69* 75 112* 54* 67*    Iron Studies: No results found for this basename: IRON:30,TIBC:30,TRANSFERRIN:30,FERRITIN:30 in the last 168 hours  Physical Exam:  Blood pressure 147/87, pulse 60, temperature 97.9 F (36.6 C), temperature source Oral, resp. rate 16, height 5\' 8"  (1.727 m), weight 75.1 kg (165 lb 9.1 oz), SpO2 97.00%.  Gen: alert, thin adult female, no distress  Skin: no rash, cyanosis  HEENT: EOMI, sclera anicteric, throat slightly dry  Neck: no JVD, loud deep R carotid bruit, no LAN  Chest: clear bilat, good air movement  Heart: regular, no rub or gallop, +2/6 SEM LUSB  Abdomen: soft, nontender, no HSM, no ascites, healed scars from abd surgery  Ext: no LE edema or UE edema  Neuro: alert, Ox3, no focal deficit   UA- 1.013, pH 7, 100 prot, 0-2wbc, TNTC rbc, few bact  UNa- 107  CXR- no acute findings  Renal US- Right kidney 12.9 cm, Left kidney 13.6 cm. +mild increased echogenicity bilat  Impression/Recommendations:  1. Renal failure, acute and/or chronic. Baseline creat was 0.5 last year, but increased to 1.1 from Dec 2012 to  May 2013. AKI prerenal vs ATN due to sepsis. Creat mid 2's, good urine output, BP OK. Urine sediment unremarkable w lower tract RBC's, no oxalate crystals and no casts. Less likely oxalosis or AIN due to intermittent cipro she takes for bact overgrowth. Has good BP and low pulse so dont think is exceedingly dry.  This is a confusing picture. Could this all be due to situation (fungemia/volume depletion) possible.  What I would like to do is follow this a little longer to see if will recover on its own before I jump to do a biopsy.  Because function is stable I can follow as OP.  UOP really increased yesterday (?post injury diuresis) continue the one liter of saline that she takes overnight with her TPN 2. Metabolic acidosis- due to renal failure vs diarrhea, check urine lytes. resolved 3. Fungal bacteremia, PICC-related.  Cath is out, on IV antifungals. May be what is responsible for slow recovery? 4. 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 Chronic anemia- unclear cause 6. Hematuria- looks lower tract on microscopy, may need work up if doesn't resolve with treatment of acute illness.  I will follow this up 7. Hypokalemia- on repletion.  Will get better when can get TPN back. I think many things will be better when she can get back on her TPN routine 8. R carotid stenosis, moderate- asymptomatic  Will continue to watch, may eventually need kidney biopsy if does not continue to improve or worsen.  I am comfortable to follow as an OP. Patient tells me that she also gets in addition to TPN- NS as OP, she should continue this practice.  I will give her some extra fluids today.    OP appt will be with me September 5 at 10:15 (gave patient card).  Will also check labs next week and pre appt (to be arranged through our office)  Renal will sign off today  Robbert Langlinais A 12/17/2011, 11:58 AM

## 2011-12-17 NOTE — Progress Notes (Signed)
ANTICOAGULATION CONSULT NOTE - Follow Up Consult  Pharmacy Consult for Coumadin Indication: Thrombosis with small bowel ischemia & proximal superior mesentery artery stenosis  Allergies  Allergen Reactions  . Iohexol Itching    IVP Dye ok if taken with benadryl  . Penicillins Itching    Patient Measurements: Height: 5\' 8"  (172.7 cm) Weight: 165 lb 9.1 oz (75.1 kg) IBW/kg (Calculated) : 63.9   Vital Signs: Temp: 97.9 F (36.6 C) (08/07 0514) Temp src: Oral (08/07 0514) BP: 147/87 mmHg (08/07 0514) Pulse Rate: 60  (08/07 0514)  Labs:  Basename 12/17/11 0630 12/16/11 0414 12/15/11 1838 12/15/11 0906  HGB 9.4* 8.7* -- --  HCT 28.7* 26.2* -- 25.6*  PLT 104* 89* -- 76*  APTT -- -- -- --  LABPROT 31.3* 21.4* -- 19.5*  INR 2.96* 1.82* -- 1.62*  HEPARINUNFRC -- -- -- --  CREATININE -- 2.48* 2.35* 2.41*  CKTOTAL -- -- -- --  CKMB -- -- -- --  TROPONINI -- -- -- --    Estimated Creatinine Clearance: 24 ml/min (by C-G formula based on Cr of 2.48).   Medications:  Scheduled:    . B-complex with vitamin C  1 tablet Oral Daily  . diphenoxylate-atropine  1 tablet Oral TID  . enoxaparin (LOVENOX) injection  70 mg Subcutaneous Q24H  . feeding supplement  237 mL Oral Daily  . feeding supplement  1 Container Oral Q1400  . fluconazole (DIFLUCAN) IV  200 mg Intravenous Q24H  . LORazepam  1 mg Intravenous Once  .  morphine injection  1 mg Intravenous Once  . multivitamin with minerals  1 tablet Oral Daily  . pantoprazole  40 mg Oral Q1200  . warfarin  7.5 mg Oral ONCE-1800  . Warfarin - Pharmacist Dosing Inpatient   Does not apply q1800   Infusions:    . sodium chloride 250 mL/hr at 12/16/11 1415  . fat emulsion    . sodium chloride 0.9 % 1,000 mL with potassium chloride 40 mEq infusion 50 mL/hr at 12/16/11 2036  . TPN (CLINIMIX) +/- additives 90 mL/hr at 12/17/11 0022  . DISCONTD: TPN (CLINIMIX) +/- additives      Assessment:  Chronic warfarin for thrombosis with small  bowel ischemia and proximal superior mesentery artery stenosis Goal INR noted as 2.5-3.0 per note on 5/28 by Dr. Welton Flakes.   Home dose reported as Warfarin 10 mg daily. INR >4 on admission. Warfarin restarted 7/30 after INR <2 after transfusions.   Lovenox d/c'd 8/1, restarted 8/5 due to unstable INR.   H/H stable. No bleeding noted. Chronic thrombocytonpenia.  INR has significantly increased overnight (1.82 >> 2.96)  Pt on concurrent fluconazole therapy which can cause elevations in INR.    Goal of Therapy:  INR 2.5-3.0 Monitor platelets by anticoagulation protocol: Yes   Plan:   Reduce coumadin to 0.5mg  today  D/C lovenox  Daily PT/INR  Loralee Pacas, PharmD, BCPS Pager: [phone] 12/17/2011,7:39 AM

## 2011-12-17 NOTE — Progress Notes (Signed)
PARENTERAL NUTRITION CONSULT NOTE - Follow Up  Pharmacy Consult for TNA Indication: Short Gut Syndrome  Allergies  Allergen Reactions  . Iohexol Itching    IVP Dye ok if taken with benadryl  . Penicillins Itching    Patient Measurements: Height: 5\' 8"  (172.7 cm) Weight: 165 lb 9.1 oz (75.1 kg) IBW/kg (Calculated) : 63.9  Adjusted Body Weight: 67.4kg Usual Weight: ~155 lbs (70.4kg) - per pt report  Vital Signs: Temp: 97.9 F (36.6 C) (08/07 0514) Temp src: Oral (08/07 0514) BP: 147/87 mmHg (08/07 0514) Pulse Rate: 60  (08/07 0514) Intake/Output from previous day: 08/06 0701 - 08/07 0700 In: 2814.8 [I.V.:842.5; IV Piggyback:100; TPN:872.3] Out: 2075 [Urine:2075] I/O: +1126ml Intake/Output from this shift:    Labs:  Basename 12/17/11 0630 12/16/11 0414 12/15/11 0906  WBC 4.3 3.4* 3.4*  HGB 9.4* 8.7* 8.4*  HCT 28.7* 26.2* 25.6*  PLT 104* 89* 76*  APTT -- -- --  INR 2.96* 1.82* 1.62*     Basename 12/17/11 0630 12/16/11 0414 12/15/11 1838 12/15/11 0906  NA 136 137 135 --  K 3.4* 3.0* 3.2* --  CL 102 101 97 --  CO2 25 27 30  --  GLUCOSE 81 72 74 --  BUN 15 12 12  --  CREATININE 2.41* 2.48* 2.35* --  LABCREA -- -- -- --  CREAT24HRUR -- -- -- --  CALCIUM 6.1* 5.9* 6.0* --  MG -- 1.4* -- 1.1*  PHOS -- 2.1* -- --  PROT -- -- -- --  ALBUMIN -- -- -- --  AST -- -- -- --  ALT -- -- -- --  ALKPHOS -- -- -- --  BILITOT -- -- -- --  BILIDIR -- -- -- --  IBILI -- -- -- --  PREALBUMIN -- 7.8* -- --  TRIG -- -- -- --  CHOLHDL -- -- -- --  CHOL -- -- -- --   Estimated Creatinine Clearance: 24.7 ml/min (by C-G formula based on Cr of 2.41).  Corrected Ca: 7.38 (using albumin 2.4  from 8/1)   Basename 12/17/11 0719 12/16/11 1724 12/16/11 1225  GLUCAP 75 54* 67*    Medical History: Past Medical History  Diagnosis Date  . Short bowel syndrome   . Abnormal LFTs   . Vitamin d deficiency   . Personal history of colonic polyps   . Thrombophilia   . Diabetes  mellitus     resolved after weight loss  . Obesity     prior to weight loss  . Allergic rhinosinusitis   . At risk for dental problems   . Fracture of left clavicle   . Osteoporosis   . Anemia of chronic disease   . Renal insufficiency   . Atypical nevus   . Pathologic fracture of neck of femur   . Pancytopenia 10/07/2011  . Small bowel ischemia   . Superior mesenteric artery stenosis   . Bacterial overgrowth syndrome     Medications:  Scheduled:     . B-complex with vitamin C  1 tablet Oral Daily  . diphenoxylate-atropine  1 tablet Oral TID  . feeding supplement  237 mL Oral Daily  . feeding supplement  1 Container Oral Q1400  . fluconazole (DIFLUCAN) IV  200 mg Intravenous Q24H  . LORazepam  1 mg Intravenous Once  .  morphine injection  1 mg Intravenous Once  . multivitamin with minerals  1 tablet Oral Daily  . pantoprazole  40 mg Oral Q1200  . warfarin  0.5 mg Oral ONCE-1800  .  warfarin  7.5 mg Oral ONCE-1800  . Warfarin - Pharmacist Dosing Inpatient   Does not apply q1800  . DISCONTD: enoxaparin (LOVENOX) injection  70 mg Subcutaneous Q24H   Infusions:     . sodium chloride 250 mL/hr at 12/16/11 1415  . fat emulsion    . sodium chloride 0.9 % 1,000 mL with potassium chloride 40 mEq infusion 50 mL/hr at 12/16/11 2036  . TPN (CLINIMIX) +/- additives 90 mL/hr at 12/17/11 0022  . DISCONTD: TPN (CLINIMIX) +/- additives      Insulin Requirements in the past 24 hours:  No SSI currently ordered due to hypoglycemia   Hypoglycemic episodes off TNA.  CBG this am = 75  Nutritional Goals:  Per RD assessment 7/30: Kcal: 1600-1750 kcal, Protein: 58-72g , Fluid: 2.5 L  Goal TNA (adjusting per RD recommendations) = Clinimx E 4.25/25 - infused over 10-12 hours plus IV lipids over 10-12hr on MWF to provide 61g Protein/day, 1948 kcal/day on lipid days, 1468 on non-lipid days, for an average of 1674 kcal/day.  Current Nutrition:   Double lumen PICC placed yesterday & TNA  resumed  Ensure Complete and Resource Breeze ordered, but pt has refused all except one dose. On Regular diet, PO intake recorded but pt not likely absorbing (see assessment below)  Tolerating daily multivitamin  Assessment: 61 yo F admitted 12/08/11 with CC: low grade fever and inability to eat for several months, found to be in worsening renal failure, ultimately admitted for dehydration/renal failure. 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 She is on chronic TNA at home (for 7 years, per pt report) infused via PICC (placed 08/13/11 at [facility] per report) Pt also has hx of abnormal LFTs, DM ("resolved after weight loss"), erosive esophagitis, anemia of chronic dz, and renal insufficiency. She is on abx frequently for bacteria overgrowth in bowel. TNA resumed 7/30 inpatient.  Pharmacist had lengthy discussion with patient on 7/30, who reports that she's been on TNA for 7 years. She reports that she's allowed to eat "whatever she wants" at home in addition to the TNA, but the food "usually passes right though her" (her daughter estimates within 30 mins). For the past couple weeks, patient reports having no appetite; however, she mentions she is hungry this morning. Spoke with a family member over the phone (granddaughter) who read her home TNA label to me, which indicates: 2100 ml total cycled over 10 hours with a 1 hour ramp up and 1 hour ramp down. TNA bag ( ) contains: 250g Dextrose, 100g Travasol 10%, 45g Intralipids 30%, MVI, TE, Zinc, Selenium, and various electrolytes. Per patient report, TNA bag also contains B-complex vitamins and folic acid, however this information was not relayed to me over the phone. Bag does not contain any insulin. This home TNA provides approx 45g Protein/day and 1675 Kcal/day. Patient is agreeable to using [facility] supplied TNA and is ok if TNA is cycled over 12 hours instead of usual 10  hours.  TNA goals adjusted to RD's recommendations inpatient: kcal goal remains the same; however, RD recommends a higher protein goal.  Advancing to higher protein supplementation is limited by the patient's current renal failure.  Labs: Lytes: Hyponatremia resolved. K+ improved but remains low despite replacement 8/4-8/5, Mg/Phos/Ca low, likely result of no TNA x 3 days Renal: SCr remains elevated. CrCl ~ 24ml/mn.    LFTs: wnl, Alk Phos slightly elevated (8/1) CBGs: hypoglycemic events  off TNA, expect improvement not that TNA resumed Prealbumin: decreasing, 12.3 (7/31) 7.8 (8/6)  Triglycerides: wnl, 61 (8/6) GI PPx: PO Protonix IVF: NS with K at 50 ml/hr per MD    Plan:  Continue Clinimix E 4.25/25, cycled over 12 hours (6p-6a): 17ml/hr x1hr, 18ml/hr x 10hr, 28ml/hr x1 hour (off 6a-6p).    IV lipids, MVI, and TE on MWF only due to national back order. Will continue to put MVI (when possible) and TE in TNA due to short gut syndrome and questionable PO absorption (pt has active order for PO MVI). MVI on backorder and may not be available on a regular basis.   Thiamine and Folic acid will be added to TNA daily per MD request.     CBG revolving around cyclic admin times, no SSI currently ordered. Will add if necessary (if CBG > 150)  TNA labs qMon/Thurs.  Follow up potassium, magnesium replacement (per protocol, pharmacy unable to replace when CrCl < 2ml/min)   Loralee Pacas, PharmD, BCPS Pager: [phone] 12/17/2011 8:09 AM

## 2011-12-17 NOTE — Progress Notes (Signed)
CARE MANAGEMENT NOTE 12/17/2011  Patient:  [name] [name], [name] [name]   Account Number:  [id number]  Date Initiated:  12/09/2011  Documentation initiated by:  Lanier Clam  Subjective/Objective Assessment:   ADMITTED W/ARF.     Action/Plan:   FROM HOME. ACTIVE W/[facility]-HHRN-1XMONTH-LAB DRAW.   Anticipated DC Date:  12/18/2011   Anticipated DC Plan:  HOME W HOME HEALTH SERVICES      DC Planning Services  CM consult      [facility] Choice  Resumption Of Svcs/PTA Provider   Choice offered to / List presented to:  C-1 Patient   DME arranged  NA      DME agency  NA     HH arranged  HH-1 RN  IV Antibiotics      HH agency  [facility]   Status of service:  Completed, signed off Medicare Important Message given?  NO (If response is "NO", the following Medicare IM given date fields will be blank) Date Medicare IM given:   Date Additional Medicare IM given:    Discharge Disposition:  HOME W HOME HEALTH SERVICES  Per UR Regulation:  Reviewed for med. necessity/level of care/duration of stay  If discussed at Long Length of Stay Meetings, dates discussed:   12/16/2011    Comments:  12/17/2011 Raynelle Bring BSN CCM [phone] Advised by attending MD that pt will be discharged today with [facility] for management of home IV abx, home cyclic TNA, [facility] notified of discharge. HHRN will go out this PM for 1st visit post hospital stay.  12/16/11 KATHY MAHABIR RN,BSN NCM [phone] FOR PICC TODAY.ID FOLLOWING.LONG TERM IV ABX.WILL NEED ORDER FOR HHRN-IV ABX,DOSE,FREQUENCY,DURATION/PICC LINE FLUSH PER PROTOCAL.IF RESUMING HHRN TPN WILL NEED ORDER RESUMPTION OF TPN PER HHRN PROTOCAL.[facility] FOLLOWING FOR HHRN.  12/15/11 KATHY MAHABIR RN,BSN NCM [phone] [facility] FOLLOWING FOR RESUMPTION OF HHRN-TPN.  12/10/11 KATHY MAHABIR RN,BSN NCM [phone] PATIENT INFORMED TO CONTACT INSURANCE CUSTOMER SERVICE FOR BENEFITS W/CONCERNS ABOUT DENTAL EXTRACTIONS,& DENTAL SURGERIES NOT EXPECTED TO OCCUR DURING THIS  ACUTE CARE HOSPITALIZATION.  12/09/11 KATHY MAHABIR RN,BSN NCM [phone] IF MD AGREE W/RESUMPTION OF HH ORDERS PLEASE PUT IN RESUMPTION OF HHRN-LAB DRAW/TPN/PICC LINE CARE PER PROTOCAL.[facility] SUSAN DALE(LIASON) NOTIFIED &

## 2011-12-17 NOTE — Discharge Summary (Signed)
Physician Discharge Summary  Sokha C Villard ZOX:[id number] DOB: 02-Mar-1951 DOA: 12/08/2011  PCP: [name], MD  Admit date: 12/08/2011 Discharge date: 12/17/2011  Recommendations for Outpatient Follow-up:  1. Please check blood sugar level, potassium, INR, and creatinine level.  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 Leone Payor who recieves tpn and ivf at home comes in with some mild lower abd crampiness and subjective fever thought she had another uti. She is on abx frequently for bacteria overgrowth in bowel. She is found to be in worsening renal failure. She has had limited po intake going on for several months now. Always nauseas but No vomiting. No change in bm. No bleeding issues. She has had several heme cards done as outpt which have all been negative for w/u of anemia which requires frequent blood transfusions. Also sees heme for her anemia and unclear the etiology of that. Has not had bone marrow bx done. She is on coumadin. Reports again no bleeding issues. She says that a component of her tpn is in short supply and she has not been able to get it and believes this may the cause of her deteroriation over the last several months. She has had abnormalities in her phos levels recently also. We are admitting her mainly for her dehydration/renal failure.  Last cr was normal.   Hospital Course:  pancytopenia  -Patient has history of chronic anemia secondary to chronic disease, she follows with Dr. Welton Flakes  -Patient hemoglobin usually between 8.5-9, she came in with hemoglobin of 7.2.  -Status post transfusion of 4 units since admission  -She had recent 5 negative cards for FOBT on 11/03/2011. She has negative EGD/colonoscopy last year.  -appreciate [facility] eval. i have discussed with her hematologist Dr Welton Flakes who feels her chronic pancytopenia is due to chronic underlying illness and given her anemia at baseline would not need bone marrow bx as inpatient and will plan as outpatient  -gets monthly arenesp injection as outpt   Fever with candidal bacteremia  Patient had temp spike of 103 on 8/1 with both blood cx from 7/31 growing candida  started on micafungin by ID Dr. Luciana Axe, which is now changed to fluconazole . recommends 14 days of treatment,   recommends ophthalmology evaluation as outpatient after completion of treatment.  -PICC line removed.  -repeat blood cx negative. Ordered for IR guided PICC placement (Waited until Blood culture was negative prior to placing picc line) -2 D echo shows mildly reduced LV function with EF 40-45% and akinesis of apical myocardium . No vegetation noted. Patient currently is volume depleted.   Short gut syndrome  -Patient is on total parenteral nutrition secondary to short gut (reported 1 foot left of her small bowel).  -Patient mentioned of missing out on nutrients and MVs Due to shortage in the injectable multivitamins.  -  TPN will be continued now that patient has Picc line in place.  Discussed with patient and she mentions that she does have her TPN and would be able to administer tonight.  We discussed her low blood sugars but still wishes to go home despite risks associated with hypoglycemia.  Patient to give herself TPN tonight which should improve her blood sugars.  Acute renal failure / ATN    initially thought likely secondary to dehydration. Renal USG unremarkable  -as per GI could possibly be associated with TPN as well.  -appreciate renal recs. Possible prerenal AKI vs ATN due to sepsis  -low bicarb also noted and given aggressive hydration with 3 amps bicarb. on 8/3 fluid increased to 250 cc/ hr ( D5 with bicarb after 1 L iv NS bolus given)  Acidosis improved. Urine output quite good and appears to be in the diuresis phase of ATN.  -currently on NS.  -being followed closely by renal. Plan for renal biopsy if not improved.  Pt has follow up appointment with Nephrology  Hyponatremia  - likely secondary to dehydration and acute renal failure.  - improved with fluids .   Hypoglycemia  frequently running in 60s but asymptomatic, just ate from her supper tray prior to d/c should resolve once TPN started    Hypokalemia / hypomagnesemia  Repleted. Hopefully should be better corrected after TPN started   Hypocalcemia  Ca of 5.9 today. given low alb , corrected would be 7 . She is asymptomatic and hopefully should improve with TPN starting today   Diarrhea  chronic  on lomotil   Hematuria  -Patient has painless hematuria occasinally but stable  -needs w/up if persistent   DVT prophylaxis  On coumadin per pharmacy   Code Status:full  Family Communication: updated daily  Disposition Plan: home once PICC placed, TPN resuled and electrolytes improved   Consultants:  [facility]  [facility] ( renal)  [facility] ( ID) Procedures:  none Antibiotics:  On IV micofungin for 2 weeks ( started on 8/2) switched to IV fluconazole today ( will complete 2 weeks course on 8/16)  Discharge Exam: Filed Vitals:   12/17/11 1415  BP: 134/86  Pulse: 57  Temp: 97.6 F (36.4 C)  Resp: 16   Filed Vitals:   12/16/11 2103 12/17/11 0101 12/17/11 0514 12/17/11 1415  BP: 132/80 127/76 147/87 134/86  Pulse: 59 54 60 57  Temp: 99.1 F (37.3 C) 98.2 F (36.8 C) 97.9 F (36.6 C) 97.6 F  (36.4 C)  TempSrc: Oral Oral Oral Oral  Resp: 18 16 16 16   Height:      Weight:   75.1 kg (165 lb 9.1 oz)   SpO2: 91% 95% 97% 97%    General: Pt in NAD, A and O x 3 Cardiovascular: RRR, No MRG Respiratory: CTA BL, no wheezes  Discharge Instructions  Discharge Orders    Future Appointments: Provider: Department: Dept Phone: Center:   01/08/2012 8:00 AM Delcie Roch [facility] [phone] None   01/08/2012 8:30 AM [facility] [phone] None   02/05/2012 8:00 AM Radene Gunning [facility] [phone] None   02/05/2012 8:30 AM [facility] [phone] None   03/04/2012 8:00 AM Radene Gunning [facility] [phone] None   03/04/2012 8:30 AM [facility] [phone] None   04/02/2012 8:00 AM Delcie Roch [facility] [phone] None   04/02/2012 8:30 AM [facility] [phone] None  04/29/2012 8:30 AM Delcie Roch [facility] [phone] None   04/29/2012 9:00 AM Victorino December, MD [facility] [phone] None     Future Orders Please Complete By Expires   Diet - low sodium heart healthy      Increase activity slowly      Discharge instructions      Comments:   Please follow up with nephrology on September 5 at 10:15.  Also you are to continue the IV antifungal until 8/16 as indicated by infectious disease doctors.   Call MD for:  temperature >100.4      Call MD for:  redness, tenderness, or signs of infection (pain, swelling, redness, odor or green/yellow discharge around incision site)      Call MD for:  persistant dizziness or light-headedness        Medication List  As of 12/17/2011  4:52 PM   TAKE these medications         ADULT TPN   Inject 2,100 mLs into the vein continuous. Infuse over 10 hours with 1 hour ramp up and 1 hour ramp down. Total bag per day provides: 250g Dextrose 70%, 100g Travasol 10%, 45g Intralipids 30%, various  electrolytes, MVI, TE, Zinc, selenium.      BENADRYL 25 mg capsule   Generic drug: diphenhydrAMINE   Take 25 mg by mouth every 6 (six) hours as needed. For itching/sleep.      calcium carbonate 750 MG chewable tablet   Commonly known as: TUMS EX   Chew 2 tablets by mouth 3 (three) times daily. Calcium      ciprofloxacin 500 MG tablet   Commonly known as: CIPRO   TAKE 1 TABLET BY MOUTH TWICE DAILY FOR 1 WEEK AT A TIME INTERMITTENTLY AS DIRECTED FOR BACTERIAL OVERGROWTH      fluconazole 2 mg/mL IVPB   Commonly known as: DIFLUCAN   Inject 100 mLs (200 mg total) into the vein daily.      multivitamin with minerals Tabs   Take 1 tablet by mouth daily.      omeprazole 40 MG capsule   Commonly known as: PRILOSEC   Take 40 mg by mouth daily.      promethazine 25 MG tablet   Commonly known as: PHENERGAN   Take 25 mg by mouth every 6 (six) hours as needed. For nausea.      TPN ELECTROLYTES IV   Inject 1 each into the vein at bedtime.      ursodiol 300 MG capsule   Commonly known as: ACTIGALL   TAKE 1 CAPSULE BY MOUTH TWICE DAILY      VANIQA 13.9 % cream   Generic drug: Eflornithine HCl   Apply 1 application topically 2 (two) times daily with a meal. Applied to facial hair.      Vitamin D (Ergocalciferol) 50000 UNITS Caps   Commonly known as: DRISDOL   ALTERNATE TAKING 1 CAPSULE BY MOUTH EVERY OTHER DAY AND 2 CAPSULES BY MOUTH EVERY OTHER DAY      warfarin 1 MG tablet   Commonly known as: COUMADIN   Take 1 tablet (1 mg total) by mouth as directed.              The results of significant diagnostics from this hospitalization (including imaging, microbiology, ancillary and laboratory) are listed below for reference.    Significant Diagnostic Studies: Dg Chest 2 View  12/09/2011  *RADIOLOGY REPORT*  Clinical Data: Fever and weakness.  Ex-smoker.  CHEST -  2 VIEW  Comparison: CT of 10/24/2011 and plain film of 01/17/2011.  Findings: A left-sided PICC line which is unchanged  and terminates at the low SVC. Midline trachea.  Borderline cardiomegaly.  Age advanced aortic atherosclerosis. No pleural effusion or pneumothorax.  The reticular nodular opacities described on the prior plain film are improved to resolved.  Mild interstitial prominence remains, and is likely related to the clinical history of  prior smoking. No lobar consolidation.  IMPRESSION: Interstitial thickening, likely related to prior smoking / chronic bronchitis.  No evidence of acute superimposed pneumonia.  Original Report Authenticated By: Consuello Bossier, M.D.   US Renal  12/09/2011  *RADIOLOGY REPORT*  Clinical Data: Hematuria.  History of diabetes.  RENAL/URINARY TRACT ULTRASOUND COMPLETE  Comparison:  Abdominal ultrasound 10/29/2011.  Findings:  Right Kidney:  Mild diffuse increased echogenicity of the renal parenchyma is nonspecific.  No focal cystic or solid renal lesions. 12.9 cm in length.  No hydronephrosis. No definite calculi.  Left Kidney:  Mild diffuse increased echogenicity of the renal parenchyma is nonspecific.  No focal cystic or solid renal lesions. 13.6 cm in length.  No hydronephrosis. No definite calculi.  Bladder:  Well distended without focal wall abnormalities.  IMPRESSION: 1.  Mild diffusely increased echogenicity of the the renal parenchyma bilaterally is nonspecific, but could suggest underlying medical renal disease. 2.  No focal cystic or solid renal lesions, and no definite evidence of calculi within the collecting systems.  Original Report Authenticated By: Florencia Reasons, M.D.   Ir Fluoro Guide Cv Line Right  12/17/2011  *RADIOLOGY REPORT*  Clinical Data: Total parenteral nutrition requirements. Malnutrition.  PICC LINE PLACEMENT WITH ULTRASOUND AND FLUOROSCOPIC  GUIDANCE  Fluoroscopy Time: 1.1 minutes.  The right arm was prepped with chlorhexidine, draped in the usual sterile fashion using maximum barrier technique (cap and mask, sterile gown, sterile gloves, large sterile  sheet, hand hygiene and cutaneous antisepsis) and infiltrated locally with 1% Lidocaine.  Ultrasound demonstrated patency of the right basilic vein, and this was documented with an image.  Under real-time ultrasound guidance, this vein was accessed with a 21 gauge micropuncture needle and image documentation was performed.  The needle was exchanged over a guidewire for a peel-away sheath through which a five [place] double lumen PICC trimmed to 33 cm was advanced, positioned with its tip at the lower SVC/right atrial junction.  Fluoroscopy during the procedure and fluoro spot radiograph confirms appropriate catheter position.  The catheter was flushed, secured to the skin with Prolene sutures, and covered with a sterile dressing.  Complications:  None.  IMPRESSION: Successful right arm PICC line placement with ultrasound and fluoroscopic guidance.  The catheter is ready for use.  Original Report Authenticated By: Donavan Burnet, M.D.   Ir US Guide Vasc Access Right  12/17/2011  *RADIOLOGY REPORT*  Clinical Data: Total parenteral nutrition requirements. Malnutrition.  PICC LINE PLACEMENT WITH ULTRASOUND AND FLUOROSCOPIC  GUIDANCE  Fluoroscopy Time: 1.1 minutes.  The right arm was prepped with chlorhexidine, draped in the usual sterile fashion using maximum barrier technique (cap and mask, sterile gown, sterile gloves, large sterile sheet, hand hygiene and cutaneous antisepsis) and infiltrated locally with 1% Lidocaine.  Ultrasound demonstrated patency of the right basilic vein, and this was documented with an image.  Under real-time ultrasound guidance, this vein was accessed with a 21 gauge micropuncture needle and image documentation was performed.  The needle was exchanged over a guidewire for a peel-away sheath through which a five [place]  double lumen PICC trimmed to 33 cm was advanced, positioned with its tip at the lower SVC/right atrial junction.  Fluoroscopy during the procedure and fluoro spot radiograph  confirms appropriate catheter position.  The catheter was flushed, secured to the skin with Prolene sutures, and covered with a sterile dressing.  Complications:  None.  IMPRESSION: Successful right arm PICC line placement with ultrasound and fluoroscopic guidance.  The catheter is ready for use.  Original Report Authenticated By: Donavan Burnet, M.D.    Microbiology: Recent Results (from the past 240 hour(s))  CULTURE, BLOOD (ROUTINE X 2)     Status: Normal   Collection Time   12/09/11  2:15 PM      Component Value Range Status Comment   Specimen Description BLOOD RIGHT ARM   Final    Special Requests BOTTLES DRAWN AEROBIC AND ANAEROBIC 5CC   Final    Culture  Setup Time 12/10/2011 00:49   Final    Culture     Final    Value: CANDIDA FAMATA     Note: Gram Stain Report Called to,Read Back By and Verified With: LORI HARRIS @0330  ON 12/12/2011 BY MCLET   Report Status 12/15/2011 FINAL   Final   CULTURE, BLOOD (ROUTINE X 2)     Status: Normal   Collection Time   12/09/11  2:25 PM      Component Value Range Status Comment   Specimen Description Blood   Final    Special Requests Normal   Final    Culture  Setup Time 12/10/2011 00:25   Final    Culture     Final    Value: CANDIDA PARAPSILOSIS     Note: Gram Stain Report Called to,Read Back By and Verified With: LAURIE HARRIS @ 1923 ON 12/11/11 BY GOLLD   Report Status 12/14/2011 FINAL   Final   CULTURE, BLOOD (ROUTINE X 2)     Status: Normal (Preliminary result)   Collection Time   12/13/11  5:50 AM      Component Value Range Status Comment   Specimen Description BLOOD LEFT HAND   Final    Special Requests BOTTLES DRAWN AEROBIC ONLY 5 CC   Final    Culture  Setup Time 12/13/2011 11:13   Final    Culture     Final    Value:        BLOOD CULTURE RECEIVED NO GROWTH TO DATE CULTURE WILL BE HELD FOR 5 DAYS BEFORE ISSUING A FINAL NEGATIVE REPORT   Report Status PENDING   Incomplete   CULTURE, BLOOD (ROUTINE X 2)     Status: Normal (Preliminary  result)   Collection Time   12/13/11  6:00 AM      Component Value Range Status Comment   Specimen Description BLOOD LEFT HAND   Final    Special Requests BOTTLES DRAWN AEROBIC AND ANAEROBIC 5 CC EACH   Final    Culture  Setup Time 12/13/2011 11:13   Final    Culture     Final    Value:        BLOOD CULTURE RECEIVED NO GROWTH TO DATE CULTURE WILL BE HELD FOR 5 DAYS BEFORE ISSUING A FINAL NEGATIVE REPORT   Report Status PENDING   Incomplete      Labs: Basic Metabolic Panel:  Lab 12/17/11 1610 12/16/11 0414 12/15/11 1838 12/15/11 0906 12/14/11 0432 12/11/11 0512  NA 136 137 135 137 134* --  K 3.4* 3.0* 3.2* 2.6* 2.7* --  CL  102 101 97 97 104 --  CO2 25 27 30  33* 21 --  GLUCOSE 81 72 74 93 71 --  BUN 15 12 12 14 18  --  CREATININE 2.41* 2.48* 2.35* 2.41* 2.52* --  CALCIUM 6.1* 5.9* 6.0* 6.0* 6.3* --  MG -- 1.4* -- 1.1* -- 1.8  PHOS -- 2.1* -- -- -- 4.1   Liver Function Tests:  Lab 12/11/11 0512  AST 24  ALT 22  ALKPHOS 126*  BILITOT 0.5  PROT 7.7  ALBUMIN 2.4*   No results found for this basename: LIPASE:5,AMYLASE:5 in the last 168 hours No results found for this basename: AMMONIA:5 in the last 168 hours CBC:  Lab 12/17/11 0630 12/16/11 0414 12/15/11 0906 12/14/11 0432 12/13/11 0600  WBC 4.3 3.4* 3.4* 4.0 4.0  NEUTROABS -- -- -- -- --  HGB 9.4* 8.7* 8.4* 8.3* 8.8*  HCT 28.7* 26.2* 25.6* 24.0* 26.0*  MCV 87.2 86.2 85.0 81.9 82.5  PLT 104* 89* 76* 66* 73*   Cardiac Enzymes: No results found for this basename: CKTOTAL:5,CKMB:5,CKMBINDEX:5,TROPONINI:5 in the last 168 hours BNP: BNP (last 3 results) No results found for this basename: PROBNP:3 in the last 8760 hours CBG:  Lab 12/17/11 1601 12/17/11 1124 12/17/11 0719 12/16/11 2101 12/16/11 1724  GLUCAP 60* 69* 75 112* 54*    Time coordinating discharge: > 35  minutes  Signed:  Penny Pia  [facility] 12/17/2011, 4:52 PM

## 2011-12-18 ENCOUNTER — Telehealth: Payer: Self-pay | Admitting: *Deleted

## 2011-12-18 NOTE — Telephone Encounter (Signed)
Annice Pih called from [facility], [facility] unable [facility], RN requesting verbal order for PT/INR to be drawn every Monday by [facility]. Informed jackie MD out of office. Will return Friday. Annice Pih  # [phone] with further instructions. Message forwarded to MD

## 2011-12-18 NOTE — Progress Notes (Signed)
Discharge summary sent to payer through [facility]  

## 2011-12-19 ENCOUNTER — Other Ambulatory Visit: Payer: Self-pay | Admitting: Internal Medicine

## 2011-12-19 LAB — CULTURE, BLOOD (ROUTINE X 2): Culture: NO GROWTH

## 2011-12-19 NOTE — Telephone Encounter (Signed)
Per MD Havery Moros at [facility], advised VO for PT/INR with lab draws on Monday

## 2011-12-19 NOTE — Telephone Encounter (Signed)
Ok to have home health do PT/INR

## 2011-12-22 ENCOUNTER — Telehealth: Payer: Self-pay

## 2011-12-22 NOTE — Telephone Encounter (Signed)
Refilled Omeprazole 40mg  #60 thru computer on 12/19/11, today got another request.  Called and left pharmacy with details that this was done 12/19/11.

## 2011-12-23 ENCOUNTER — Other Ambulatory Visit: Payer: Self-pay | Admitting: Internal Medicine

## 2011-12-23 ENCOUNTER — Other Ambulatory Visit: Payer: Self-pay | Admitting: Medical Oncology

## 2011-12-23 ENCOUNTER — Telehealth: Payer: Self-pay | Admitting: Medical Oncology

## 2011-12-23 DIAGNOSIS — D638 Anemia in other chronic diseases classified elsewhere: Secondary | ICD-10-CM

## 2011-12-23 NOTE — Telephone Encounter (Signed)
Per MD, instructed patient to hold coumadin, return to clinic on Friday to have repeat PT/INR drawn and if patient has any bleeding to report to the emergency room.  Patient expressed understanding however could not come to clinic on Friday for lab draw, requested to come Thursday.  Appointment made for Thursday afternoon, patient confirmed date and time.  No further questions.

## 2011-12-25 ENCOUNTER — Other Ambulatory Visit: Payer: BC Managed Care – PPO | Admitting: Lab

## 2011-12-25 ENCOUNTER — Emergency Department (HOSPITAL_BASED_OUTPATIENT_CLINIC_OR_DEPARTMENT_OTHER): Payer: BC Managed Care – PPO

## 2011-12-25 ENCOUNTER — Inpatient Hospital Stay (HOSPITAL_BASED_OUTPATIENT_CLINIC_OR_DEPARTMENT_OTHER)
Admission: EM | Admit: 2011-12-25 | Discharge: 2011-12-28 | DRG: 544 | Disposition: A | Discharge status: Home / Self Care | Payer: BC Managed Care – PPO | Attending: Internal Medicine | Admitting: Internal Medicine

## 2011-12-25 ENCOUNTER — Telehealth: Payer: Self-pay | Admitting: Medical Oncology

## 2011-12-25 ENCOUNTER — Encounter (HOSPITAL_BASED_OUTPATIENT_CLINIC_OR_DEPARTMENT_OTHER): Payer: Self-pay | Admitting: Emergency Medicine

## 2011-12-25 DIAGNOSIS — B49 Unspecified mycosis: Secondary | ICD-10-CM | POA: Diagnosis present

## 2011-12-25 DIAGNOSIS — D696 Thrombocytopenia, unspecified: Secondary | ICD-10-CM | POA: Diagnosis present

## 2011-12-25 DIAGNOSIS — I5022 Chronic systolic (congestive) heart failure: Secondary | ICD-10-CM | POA: Diagnosis present

## 2011-12-25 DIAGNOSIS — I059 Rheumatic mitral valve disease, unspecified: Secondary | ICD-10-CM

## 2011-12-25 DIAGNOSIS — Z860100 Personal history of colon polyps, unspecified: Secondary | ICD-10-CM

## 2011-12-25 DIAGNOSIS — F17200 Nicotine dependence, unspecified, uncomplicated: Secondary | ICD-10-CM | POA: Diagnosis present

## 2011-12-25 DIAGNOSIS — E860 Dehydration: Secondary | ICD-10-CM

## 2011-12-25 DIAGNOSIS — H6980 Other specified disorders of Eustachian tube, unspecified ear: Secondary | ICD-10-CM

## 2011-12-25 DIAGNOSIS — D61818 Other pancytopenia: Secondary | ICD-10-CM | POA: Diagnosis present

## 2011-12-25 DIAGNOSIS — D6859 Other primary thrombophilia: Secondary | ICD-10-CM

## 2011-12-25 DIAGNOSIS — J019 Acute sinusitis, unspecified: Secondary | ICD-10-CM

## 2011-12-25 DIAGNOSIS — E639 Nutritional deficiency, unspecified: Secondary | ICD-10-CM

## 2011-12-25 DIAGNOSIS — R7402 Elevation of levels of lactic acid dehydrogenase (LDH): Secondary | ICD-10-CM | POA: Diagnosis present

## 2011-12-25 DIAGNOSIS — B379 Candidiasis, unspecified: Secondary | ICD-10-CM | POA: Diagnosis present

## 2011-12-25 DIAGNOSIS — R509 Fever, unspecified: Secondary | ICD-10-CM

## 2011-12-25 DIAGNOSIS — H109 Unspecified conjunctivitis: Secondary | ICD-10-CM

## 2011-12-25 DIAGNOSIS — E871 Hypo-osmolality and hyponatremia: Secondary | ICD-10-CM

## 2011-12-25 DIAGNOSIS — K219 Gastro-esophageal reflux disease without esophagitis: Secondary | ICD-10-CM

## 2011-12-25 DIAGNOSIS — D638 Anemia in other chronic diseases classified elsewhere: Secondary | ICD-10-CM | POA: Diagnosis present

## 2011-12-25 DIAGNOSIS — K6389 Other specified diseases of intestine: Secondary | ICD-10-CM | POA: Diagnosis present

## 2011-12-25 DIAGNOSIS — R778 Other specified abnormalities of plasma proteins: Secondary | ICD-10-CM

## 2011-12-25 DIAGNOSIS — J811 Chronic pulmonary edema: Secondary | ICD-10-CM | POA: Diagnosis present

## 2011-12-25 DIAGNOSIS — B3789 Other sites of candidiasis: Secondary | ICD-10-CM | POA: Diagnosis present

## 2011-12-25 DIAGNOSIS — R7401 Elevation of levels of liver transaminase levels: Secondary | ICD-10-CM | POA: Diagnosis present

## 2011-12-25 DIAGNOSIS — R9389 Abnormal findings on diagnostic imaging of other specified body structures: Secondary | ICD-10-CM

## 2011-12-25 DIAGNOSIS — Z7901 Long term (current) use of anticoagulants: Secondary | ICD-10-CM

## 2011-12-25 DIAGNOSIS — Z79899 Other long term (current) drug therapy: Secondary | ICD-10-CM

## 2011-12-25 DIAGNOSIS — K912 Postsurgical malabsorption, not elsewhere classified: Secondary | ICD-10-CM | POA: Diagnosis present

## 2011-12-25 DIAGNOSIS — R748 Abnormal levels of other serum enzymes: Secondary | ICD-10-CM

## 2011-12-25 DIAGNOSIS — N179 Acute kidney failure, unspecified: Secondary | ICD-10-CM | POA: Diagnosis present

## 2011-12-25 DIAGNOSIS — E559 Vitamin D deficiency, unspecified: Secondary | ICD-10-CM

## 2011-12-25 DIAGNOSIS — R630 Anorexia: Secondary | ICD-10-CM

## 2011-12-25 DIAGNOSIS — K089 Disorder of teeth and supporting structures, unspecified: Secondary | ICD-10-CM

## 2011-12-25 DIAGNOSIS — I509 Heart failure, unspecified: Secondary | ICD-10-CM | POA: Diagnosis present

## 2011-12-25 DIAGNOSIS — E876 Hypokalemia: Secondary | ICD-10-CM

## 2011-12-25 DIAGNOSIS — I5023 Acute on chronic systolic (congestive) heart failure: Principal | ICD-10-CM

## 2011-12-25 DIAGNOSIS — M810 Age-related osteoporosis without current pathological fracture: Secondary | ICD-10-CM

## 2011-12-25 DIAGNOSIS — N390 Urinary tract infection, site not specified: Secondary | ICD-10-CM

## 2011-12-25 LAB — CBC WITH DIFFERENTIAL/PLATELET
Basophils Relative: 1 % (ref 0–1)
Eosinophils Absolute: 0.4 10*3/uL (ref 0.0–0.7)
Eosinophils Relative: 6 % — ABNORMAL HIGH (ref 0–5)
Hemoglobin: 10 g/dL — ABNORMAL LOW (ref 12.0–15.0)
MCH: 28.9 pg (ref 26.0–34.0)
MCHC: 33.3 g/dL (ref 30.0–36.0)
MCV: 86.7 fL (ref 78.0–100.0)
Monocytes Absolute: 0.6 10*3/uL (ref 0.1–1.0)
Monocytes Relative: 8 % (ref 3–12)
Neutrophils Relative %: 57 % (ref 43–77)

## 2011-12-25 LAB — URINALYSIS, ROUTINE W REFLEX MICROSCOPIC
Bilirubin Urine: NEGATIVE
Nitrite: NEGATIVE
Specific Gravity, Urine: 1.01 (ref 1.005–1.030)
pH: 7.5 (ref 5.0–8.0)

## 2011-12-25 LAB — URINE MICROSCOPIC-ADD ON

## 2011-12-25 LAB — GLUCOSE, CAPILLARY: Glucose-Capillary: 76 mg/dL (ref 70–99)

## 2011-12-25 LAB — BASIC METABOLIC PANEL
BUN: 33 mg/dL — ABNORMAL HIGH (ref 6–23)
Calcium: 8.8 mg/dL (ref 8.4–10.5)
Creatinine, Ser: 2.5 mg/dL — ABNORMAL HIGH (ref 0.50–1.10)
GFR calc Af Amer: 23 mL/min — ABNORMAL LOW (ref >90)
GFR calc non Af Amer: 20 mL/min — ABNORMAL LOW (ref >90)
Glucose, Bld: 196 mg/dL — ABNORMAL HIGH (ref 70–99)
Potassium: 3.5 mEq/L (ref 3.5–5.1)

## 2011-12-25 LAB — CARDIAC PANEL(CRET KIN+CKTOT+MB+TROPI)
CK, MB: 3.8 ng/mL (ref 0.3–4.0)
Relative Index: INVALID (ref 0.0–2.5)
Total CK: 46 U/L (ref 7–177)
Troponin I: 0.59 ng/mL (ref <0.30)

## 2011-12-25 LAB — TRIGLYCERIDES: Triglycerides: 124 mg/dL (ref <150)

## 2011-12-25 LAB — HEPATIC FUNCTION PANEL
Albumin: 2.5 g/dL — ABNORMAL LOW (ref 3.5–5.2)
Alkaline Phosphatase: 134 U/L — ABNORMAL HIGH (ref 39–117)
Total Protein: 8.4 g/dL — ABNORMAL HIGH (ref 6.0–8.3)

## 2011-12-25 LAB — CHOLESTEROL, TOTAL: Cholesterol: 92 mg/dL (ref 0–200)

## 2011-12-25 LAB — PREALBUMIN: Prealbumin: 13.3 mg/dL — ABNORMAL LOW (ref 17.0–34.0)

## 2011-12-25 MED ORDER — ALBUTEROL SULFATE (5 MG/ML) 0.5% IN NEBU
INHALATION_SOLUTION | RESPIRATORY_TRACT | Status: AC
  Filled 2011-12-25: qty 1

## 2011-12-25 MED ORDER — FUROSEMIDE 10 MG/ML IJ SOLN
40.0000 mg | Freq: Once | INTRAMUSCULAR | Status: AC
  Administered 2011-12-25: 40 mg via INTRAVENOUS
  Filled 2011-12-25: qty 4

## 2011-12-25 MED ORDER — INSULIN ASPART 100 UNIT/ML ~~LOC~~ SOLN: 0.0000 [IU] | Freq: Four times a day (QID) | SUBCUTANEOUS | Status: DC

## 2011-12-25 MED ORDER — CALCIUM CARBONATE ANTACID 500 MG PO CHEW
3.0000 | CHEWABLE_TABLET | Freq: Three times a day (TID) | ORAL | Status: DC
  Administered 2011-12-25 – 2011-12-28 (×12): 600 mg via ORAL
  Filled 2011-12-25 (×13): qty 3

## 2011-12-25 MED ORDER — DIPHENHYDRAMINE HCL 25 MG PO CAPS
25.0000 mg | ORAL_CAPSULE | Freq: Four times a day (QID) | ORAL | Status: DC | PRN
  Administered 2011-12-25: 25 mg via ORAL
  Filled 2011-12-25: qty 1

## 2011-12-25 MED ORDER — ALBUTEROL SULFATE (5 MG/ML) 0.5% IN NEBU
5.0000 mg | INHALATION_SOLUTION | Freq: Once | RESPIRATORY_TRACT | Status: AC
  Administered 2011-12-25: 5 mg via RESPIRATORY_TRACT

## 2011-12-25 MED ORDER — ONDANSETRON HCL 4 MG/2ML IJ SOLN: 4.0000 mg | Freq: Four times a day (QID) | INTRAMUSCULAR | Status: DC | PRN

## 2011-12-25 MED ORDER — VITAMIN D (ERGOCALCIFEROL) 1.25 MG (50000 UNIT) PO CAPS
50000.0000 [IU] | ORAL_CAPSULE | ORAL | Status: DC
  Administered 2011-12-25: 50000 [IU] via ORAL
  Filled 2011-12-25: qty 1

## 2011-12-25 MED ORDER — TRAVASOL 10 % IV SOLN: 2100.0000 mL | INTRAVENOUS | Status: DC

## 2011-12-25 MED ORDER — POTASSIUM CHLORIDE CRYS ER 10 MEQ PO TBCR
10.0000 meq | EXTENDED_RELEASE_TABLET | Freq: Every day | ORAL | Status: DC
  Administered 2011-12-25 – 2011-12-28 (×3): 10 meq via ORAL
  Filled 2011-12-25 (×4): qty 1

## 2011-12-25 MED ORDER — FLUCONAZOLE IN SODIUM CHLORIDE 200-0.9 MG/100ML-% IV SOLN
200.0000 mg | INTRAVENOUS | Status: DC
  Administered 2011-12-25 – 2011-12-26 (×2): 200 mg via INTRAVENOUS
  Filled 2011-12-25 (×2): qty 100

## 2011-12-25 MED ORDER — SODIUM CHLORIDE 0.9 % IJ SOLN
3.0000 mL | Freq: Two times a day (BID) | INTRAMUSCULAR | Status: DC
  Administered 2011-12-25 – 2011-12-26 (×2): 3 mL via INTRAVENOUS

## 2011-12-25 MED ORDER — CARVEDILOL 3.125 MG PO TABS
3.1250 mg | ORAL_TABLET | Freq: Two times a day (BID) | ORAL | Status: DC
  Administered 2011-12-25 – 2011-12-28 (×8): 3.125 mg via ORAL
  Filled 2011-12-25 (×9): qty 1

## 2011-12-25 MED ORDER — ASPIRIN EC 81 MG PO TBEC
81.0000 mg | DELAYED_RELEASE_TABLET | Freq: Every day | ORAL | Status: DC
  Administered 2011-12-25 – 2011-12-28 (×4): 81 mg via ORAL
  Filled 2011-12-25 (×4): qty 1

## 2011-12-25 MED ORDER — SODIUM CHLORIDE 0.9 % IJ SOLN: 3.0000 mL | INTRAMUSCULAR | Status: DC | PRN

## 2011-12-25 MED ORDER — SODIUM CHLORIDE 0.9 % IV SOLN: Freq: Once | INTRAVENOUS | Status: DC

## 2011-12-25 MED ORDER — PANTOPRAZOLE SODIUM 40 MG PO TBEC
80.0000 mg | DELAYED_RELEASE_TABLET | Freq: Two times a day (BID) | ORAL | Status: DC
  Administered 2011-12-25 – 2011-12-28 (×8): 80 mg via ORAL
  Filled 2011-12-25: qty 1
  Filled 2011-12-25 (×3): qty 2
  Filled 2011-12-25 (×2): qty 1
  Filled 2011-12-25: qty 2
  Filled 2011-12-25: qty 1
  Filled 2011-12-25: qty 2
  Filled 2011-12-25: qty 1

## 2011-12-25 MED ORDER — SODIUM CHLORIDE 0.9 % IV SOLN: 250.0000 mL | INTRAVENOUS | Status: DC | PRN

## 2011-12-25 MED ORDER — FUROSEMIDE 10 MG/ML IJ SOLN
60.0000 mg | Freq: Two times a day (BID) | INTRAMUSCULAR | Status: DC
  Administered 2011-12-25 – 2011-12-27 (×5): 60 mg via INTRAVENOUS
  Filled 2011-12-25 (×7): qty 6

## 2011-12-25 MED ORDER — IPRATROPIUM BROMIDE 0.02 % IN SOLN
RESPIRATORY_TRACT | Status: AC
  Filled 2011-12-25: qty 2.5

## 2011-12-25 MED ORDER — ADULT MULTIVITAMIN W/MINERALS CH
1.0000 | ORAL_TABLET | Freq: Every day | ORAL | Status: DC
  Administered 2011-12-25 – 2011-12-26 (×2): 1 via ORAL
  Filled 2011-12-25 (×3): qty 1

## 2011-12-25 MED ORDER — CALCIUM CARBONATE ANTACID 750 MG PO CHEW: 2.0000 | CHEWABLE_TABLET | Freq: Three times a day (TID) | ORAL | Status: DC

## 2011-12-25 MED ORDER — CLINIMIX E/DEXTROSE (4.25/25) 4.25 % IV SOLN
INTRAVENOUS | Status: AC
  Administered 2011-12-25: 18:00:00 via INTRAVENOUS
  Filled 2011-12-25: qty 2000

## 2011-12-25 MED ORDER — ACETAMINOPHEN 325 MG PO TABS
650.0000 mg | ORAL_TABLET | ORAL | Status: DC | PRN
  Administered 2011-12-25 – 2011-12-28 (×4): 650 mg via ORAL
  Filled 2011-12-25 (×4): qty 2

## 2011-12-25 MED ORDER — IPRATROPIUM BROMIDE 0.02 % IN SOLN
0.5000 mg | Freq: Once | RESPIRATORY_TRACT | Status: AC
  Administered 2011-12-25: 0.5 mg via RESPIRATORY_TRACT

## 2011-12-25 NOTE — Progress Notes (Addendum)
PARENTERAL NUTRITION CONSULT NOTE - INITIAL  Pharmacy Consult for TNA Indication: Short Gut Syndrome  Allergies  Allergen Reactions  . Iohexol Itching    IVP Dye ok if taken with benadryl  . Penicillins Itching    Patient Measurements: Height: 5\' 8"  (172.7 cm) Weight: 161 lb 13.1 oz (73.4 kg) IBW/kg (Calculated) : 63.9  Adjusted Body Weight: n/a Usual Weight: 70.4 kg  Vital Signs: Temp: 98.2 F (36.8 C) (08/15 0700) Temp src: Oral (08/15 0700) BP: 135/74 mmHg (08/15 0700) Pulse Rate: 86  (08/15 0700) Intake/Output from previous day: 08/14 0701 - 08/15 0700 In: -  Out: 600 [Urine:600] Intake/Output from this shift: Total I/O In: 10 [I.V.:10] Out: -   Labs:  Basename 12/25/11 0422  WBC 7.0  HGB 10.0*  HCT 30.0*  PLT 146*  APTT --  INR 4.00*     Basename 12/25/11 0422  NA 132*  K 3.5  CL 95*  CO2 28  GLUCOSE 196*  BUN 33*  CREATININE 2.50*  LABCREA --  CREAT24HRUR --  CALCIUM 8.8  MG --  PHOS --  PROT --  ALBUMIN --  AST --  ALT --  ALKPHOS --  BILITOT --  BILIDIR --  IBILI --  PREALBUMIN --  TRIG --  CHOLHDL --  CHOL --   Estimated Creatinine Clearance: 23.8 ml/min (by C-G formula based on Cr of 2.5).   No results found for this basename: GLUCAP:3 in the last 72 hours  Medical History: Past Medical History  Diagnosis Date  . Short bowel syndrome   . Abnormal LFTs   . Vitamin d deficiency   . Personal history of colonic polyps   . Thrombophilia   . Diabetes mellitus     resolved after weight loss  . Obesity     prior to weight loss  . Allergic rhinosinusitis   . At risk for dental problems   . Fracture of left clavicle   . Osteoporosis   . Anemia of chronic disease   . Renal insufficiency   . Atypical nevus   . Pathologic fracture of neck of femur   . Pancytopenia 10/07/2011  . Small bowel ischemia   . Superior mesenteric artery stenosis   . Bacterial overgrowth syndrome     Medications:  Scheduled:    . albuterol  5  mg Nebulization Once  . albuterol      . aspirin EC  81 mg Oral Daily  . calcium carbonate  3 tablet Oral TID WC  . carvedilol  3.125 mg Oral BID WC  . fluconazole  200 mg Intravenous Q24H  . furosemide  40 mg Intravenous Once  . furosemide  60 mg Intravenous BID  . ipratropium  0.5 mg Nebulization Once  . multivitamin with minerals  1 tablet Oral Daily  . pantoprazole  80 mg Oral BID AC  . potassium chloride  10 mEq Oral Daily  . sodium chloride  3 mL Intravenous Q12H  . Vitamin D (Ergocalciferol)  50,000 Units Oral Q7 days  . DISCONTD: sodium chloride   Intravenous Once  . DISCONTD: calcium carbonate  2 tablet Oral TID  . DISCONTD: ipratropium        Insulin Requirements in the past 24 hours:  Just admitted on home TPN -- no SSI currently ordered  Nutritional Goals:  Pending RD assessment  Current Nutrition:  Cyclic home TPN regimen provides an average of 1682 kcal and 61 grams protein per day Diet: Heart healthy diet ordered  Assessment: 61 y.o F recently hospitalized at [facility] from 7/29 - 8/7  for fungemia (new PICC line placed 8/6) and acute renal failure and [facility] for acute on chronic SHF exacerbation . 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 PTA, the patient was on a cyclic TNA infused over 10 hours each day which provided an average of 1682 kcal (224 kcal (61g) protein and 1224 kcal (360g) CHO daily with 500 kcal (50g) lipids 3x/week, zinc/selenium 3x/week). The patient also is able to eat in addition to the TNA -- however she likely has minimal absorption from this. The patient understands she will be using a  TNA and will continue her cycling schedule.  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 On chronic TNA. On heart health diet, PPI po Endo: No hx DM. CBG 196 on BMET. Lytes:  Na 132, K 3.5, Cl~95. On KCl 10 mEq daily, calcium carbonate tidwc, multivitamins po, vitamin D Renal: Hx recent ARI with SCr similar to what it was when discharged from [name] earlier this month. Previous known baseline around 0.8-1.1. SCr 2.5, CrCl~20-25 ml/min.  Pulm: RA Cards: Admitted with acute on chronic SHF exacerbation. BNP [id number]. BP/HR wnl. On ASA, coreg, lasix Hepatobil: Pending this admit, LFTs/TBili wnl, alk phos 126 on 8/1, TG wnl (7/31) Neuro: A&O, pain score 0 ID: Hx recent fungemia (candida parapsilosis and famata). Per ID during recent admit at [facility] -- to continue IV fluconazole for a total of 2 weeks (8/13 -8/16) and to get outpatient eye exam to r/o endophthalmitis prior to stopping the fluconazole. Pt currently asymptomatic of fungemia. New PICC line placed 8/6 Afebrile, WBc WNL, continues on IV fluconazole Best Practices: PPI po  Plan:  1. Start Clinimix E 4.25/25, 1450 ml cycled over 12 hours (6p-6a): 50 ml/hr x 1 hour, 135 ml/hr x 10 hours, then 50 ml/hr x 1 hour 2. Provide trace elements, lipids, and MVI on MWF only 2/2 national shortage 3. Add sensitive SSI and CBG checks q6h, TNA labs Mon/Thurs 4. Will follow-up RD assessment for any changes in nutrition goals 5. F/u TNA labs  Georgina Pillion, PharmD, BCPS Clinical Pharmacist Pager: [phone] 12/25/2011 11:15 AM

## 2011-12-25 NOTE — Progress Notes (Signed)
  Echocardiogram 2D Echocardiogram has been performed.  [name] [name] 12/25/2011, 10:45 AM

## 2011-12-25 NOTE — Consult Note (Signed)
Reason for Consult: Recent acute renal failure   Referring Physician: Dr. Beverely Low  HPI:  [name] [name] is an 61 y.o. 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 She is on TPN (on chronic anticoagulation for [facility] line), who present with dyspnea.  [name] et was recently hospitalized for fungemia and acute renal failure. She was discharged from hospital on 12/17/11 on Diflucan. She reports that she started having cough 4 to 5 days ago, and coughs up clear sputum without blood in it. Two days ago, she started having SOB, PND and orthopnea, which has been progressively getting worse, and was brought to ED last night.   She denies fever, chills, chest pain and pain in her calf areas. She does not have any cardiac history she knows of. She was found to have pulmonary edema. Her recent 2D echo on 12/12/11 showed EF of 40% to 45% with regional wall motion abnormalities, akinesis of the apical myocardium and, akinesis of the mid-distal inferior myocardium. Her pro-B[place] over 23321. Tropidine elevated to 0.72.   Patient's creatinine was normal on 10/07/11. She was found to have acute renal failure in her previous admission on 7/29 with creatinine of 2.27. Her creatinine was 2.41 at discharge on 12/17/11. On this admission, her creatinine is up to 2.50 on 12/25/11. K, Ca, Mg and phosphorus are normal.  Patient feels much better today with good diuretic response to lasix.  PMH:   Past Medical History  Diagnosis Date  . Short bowel syndrome   . Abnormal LFTs   . Vitamin d deficiency   . Personal history of colonic polyps   . Thrombophilia   . Diabetes mellitus     resolved after weight loss  . Obesity     prior to weight loss  . Allergic rhinosinusitis   . At risk for dental problems   . Fracture of left clavicle   . Osteoporosis   . Anemia of chronic disease   . Renal insufficiency   . Atypical nevus   . Pathologic fracture of neck of femur   .  Pancytopenia 10/07/2011  . Small bowel ischemia   . Superior mesenteric artery stenosis   . Bacterial overgrowth syndrome     PSH:   Past Surgical History  Procedure Date  . Small intestine surgery 2005    multiple with right colon resection for ischemia/infarct  . Cholecystectomy   . Orif proximal femoral fracture w/ itst nail system 03/2007    left, Dr. Simonne Come  . Esophagogastroduodenoscopy 01/22/2009    erosive esophagitis  . Colonoscopy 12/05/2005    internal hemorrhoids (for polyp surveillance)    Allergies:  Allergies  Allergen Reactions  . Iohexol Itching    IVP Dye ok if taken with benadryl  . Penicillins Itching    Medications:   Prior to Admission medications   Medication Sig Start Date End Date Taking? Authorizing Provider  ADULT TPN Inject 2,100 mLs into the vein continuous. Infuse over 10 hours with 1 hour ramp up and 1 hour ramp down. Total bag per day provides: 250g Dextrose 70%, 100g Travasol 10%, 45g Intralipids 30%, various electrolytes, MVI, TE, Zinc, selenium.   Yes Historical Provider, MD  calcium carbonate (TUMS EX) 750 MG chewable tablet Chew 2 tablets by mouth 3 (three) times daily. Calcium   Yes Historical Provider, MD  ciprofloxacin (CIPRO) 500 MG tablet TAKE 1 TABLET BY MOUTH TWICE DAILY FOR 1 WEEK AT  A TIME INTERMITTENTLY AS DIRECTED FOR BACTERIAL OVERGROWTH 11/10/11  Yes Iva Boop, MD  diphenhydrAMINE (BENADRYL) 25 mg capsule Take 25 mg by mouth every 6 (six) hours as needed. For itching/sleep.   Yes Historical Provider, MD  Eflornithine HCl (VANIQA) 13.9 % cream Apply 1 application topically 2 (two) times daily with a meal. Applied to facial hair.   Yes Historical Provider, MD  fluconazole (DIFLUCAN) 2 mg/mL IVPB Inject 100 mLs (200 mg total) into the vein daily. 12/17/11 12/26/11 Yes Penny Pia, MD  Multiple Vitamin (MULTIVITAMIN WITH MINERALS) TABS Take 1 tablet by mouth daily.   Yes Historical Provider, MD  omeprazole (PRILOSEC) 40 MG  capsule Take 40 mg by mouth daily. 11/20/10  Yes Iva Boop, MD  Parenteral Electrolytes (TPN ELECTROLYTES IV) Inject 1 each into the vein at bedtime.    Yes Historical Provider, MD  promethazine (PHENERGAN) 25 MG tablet Take 25 mg by mouth every 6 (six) hours as needed. For nausea.   Yes Historical Provider, MD  ursodiol (ACTIGALL) 300 MG capsule TAKE 1 CAPSULE BY MOUTH TWICE DAILY 07/11/11  Yes Iva Boop, MD  Vitamin D, Ergocalciferol, (DRISDOL) 50000 UNITS CAPS ALTERNATE TAKING 1 CAPSULE BY MOUTH EVERY OTHER DAY AND 2 CAPSULES BY MOUTH EVERY OTHER DAY 10/15/11  Yes Iva Boop, MD  warfarin (COUMADIN) 1 MG tablet Take 1 tablet (1 mg total) by mouth as directed. 12/17/11 12/16/12 Yes Penny Pia, MD    Discontinued Meds:   Medications Discontinued During This Encounter  Medication Reason  . ipratropium (ATROVENT) 0.02 % nebulizer solution   . 0.9 %  sodium chloride infusion   . calcium carbonate (TUMS EX) chewable tablet 1,500 mg Formulary change  . ADULT TPN Reorder  . omeprazole (PRILOSEC) 40 MG capsule Duplicate   Family History:   Family History  Problem Relation Age of Onset  . Diabetes Mother   . Hypertension Mother   . Colon cancer Neg Hx     Social History:  reports that she has been smoking Cigarettes.  She does not have any smokeless tobacco history on file. She reports that she does not drink alcohol or use illicit drugs.  Blood pressure 110/73, pulse 79, temperature 98 F (36.7 C), temperature source Oral, resp. rate 26, height 5\' 8"  (1.727 m), weight 161 lb 13.1 oz (73.4 kg), SpO2 92.00%.   Vitals: T: 98.9;  HR: 79;  BP:110/73;  RR: 26; O2 saturation: 92%  General: resting in bed, not in acute distress HEENT: PERRL, EOMI, no scleral icterus. No JVD or Bruit Cardiac: S1/S2, RRR, No murmurs, gallops or rubs Pulm: Decreased air movement bilaterally (R>L), Bilateral crackles. No rhonchi or rubs. Abd: Soft,  nondistended, nontender, no rebound pain, no organomegaly,  BS present Ext: 1+ pitting leg edema bilaterally. 2+DP/PT pulse bilaterally. Right upper extremity PICC is present. Musculoskeletal: No joint deformities, erythema, or stiffness, ROM full and nontender Skin: no rashes. No skin bruise. Neuro: alert and oriented X3, cranial nerves II-XII grossly intact, muscle strength 5/5 in all extremeties,  sensation to light touch intact.   Creat  Date/Time Value Range Status  03/01/2009 10:29 AM 0.5* 0.6 - 1.2 mg/dl Final  7/82/9562  1:30 AM 0.7  0.6 - 1.2 mg/dl Final     Creatinine, Ser  Date/Time Value Range Status  12/25/2011  4:22 AM 2.50* 0.50 - 1.10 mg/dL Final  12/16/5782  6:96 AM 2.41* 0.50 - 1.10 mg/dL Final  06/20/5282  1:32 AM 2.48* 0.50 - 1.10 mg/dL  Final  12/15/2011  6:38 PM 2.35* 0.50 - 1.10 mg/dL Final  4/0/9811  9:14 AM 2.41* 0.50 - 1.10 mg/dL Final  11/17/2954  2:13 AM 2.52* 0.50 - 1.10 mg/dL Final  0/12/6576  4:69 AM 2.67* 0.50 - 1.10 mg/dL Final  10/12/9526  4:13 AM 2.49* 0.50 - 1.10 mg/dL Final  06/15/4008  2:72 AM 2.39* 0.50 - 1.10 mg/dL Final  5/36/6440  3:47 AM 2.42* 0.50 - 1.10 mg/dL Final  09/03/9561  8:75 PM 2.38* 0.50 - 1.10 mg/dL Final  6/43/3295  1:88 PM 2.27* 0.50 - 1.10 mg/dL Final  08/25/6061  0:16 PM 1.13* 0.50 - 1.10 mg/dL Final  0/02/9322 55:73 AM 1.1  0.4 - 1.2 mg/dL Final  07/01/2540  7:06 PM 0.75  0.50 - 1.10 mg/dL Final  23/11/6281  1:51 PM 0.6  0.4 - 1.2 mg/dL Final  76/16/0737 1.06   Final  04/22/2010 0.48   Final  04/12/2010 10:24 AM 0.4  0.4-1.2 mg/dL Final  26/94/8546 2.70   Final  10/01/2009 11:35 AM 0.5  0.4-1.2 mg/dL Final  3/50/0938  1:82 AM 0.5  0.4-1.2 mg/dL Final  99/07/7167  6:78 AM 0.45   Final  03/19/2007  5:00 AM 0.48   Final  03/17/2007  4:00 AM 0.50   Final  03/16/2007  5:20 AM 0.42   Final  03/15/2007 11:39 AM 0.45   Final  01/15/2007  9:59 AM 0.50  0.40 - 1.20 mg/dL Final  01/12/8100 75:10 AM 0.46  0.40 - 1.20 mg/dL Final  2/58/5277  8:24 PM 0.49  0.40 - 1.20 mg/dL Final  06/15/5359  4:43 PM 0.50  0.40 - 1.20  mg/dL Final  1/54/0086 76:19 AM 0.5  0.4 - 1.2 mg/dL Final    Results for orders placed during the hospital encounter of 12/25/11 (from the past 48 hour(s))  CBC WITH DIFFERENTIAL     Status: Abnormal   Collection Time   12/25/11  4:22 AM      Component Value Range Comment   WBC 7.0  4.0 - 10.5 K/uL    RBC 3.46 (*) 3.87 - 5.11 MIL/uL    Hemoglobin 10.0 (*) 12.0 - 15.0 g/dL    HCT 50.9 (*) 32.6 - 46.0 %    MCV 86.7  78.0 - 100.0 fL    MCH 28.9  26.0 - 34.0 pg    MCHC 33.3  30.0 - 36.0 g/dL    RDW 71.2 (*) 45.8 - 15.5 %    Platelets 146 (*) 150 - 400 K/uL    Neutrophils Relative 57  43 - 77 %    Neutro Abs 4.0  1.7 - 7.7 K/uL    Lymphocytes Relative 28  12 - 46 %    Lymphs Abs 1.9  0.7 - 4.0 K/uL    Monocytes Relative 8  3 - 12 %    Monocytes Absolute 0.6  0.1 - 1.0 K/uL    Eosinophils Relative 6 (*) 0 - 5 %    Eosinophils Absolute 0.4  0.0 - 0.7 K/uL    Basophils Relative 1  0 - 1 %    Basophils Absolute 0.0  0.0 - 0.1 K/uL   BASIC METABOLIC PANEL     Status: Abnormal   Collection Time   12/25/11  4:22 AM      Component Value Range Comment   Sodium 132 (*) 135 - 145 mEq/L    Potassium 3.5  3.5 - 5.1 mEq/L    Chloride 95 (*) 96 - 112 mEq/L  CO2 28  19 - 32 mEq/L    Glucose, Bld 196 (*) 70 - 99 mg/dL    BUN 33 (*) 6 - 23 mg/dL    Creatinine, Ser 1.61 (*) 0.50 - 1.10 mg/dL    Calcium 8.8  8.4 - 09.6 mg/dL    GFR calc non Af Amer 20 (*) >90 mL/min    GFR calc Af Amer 23 (*) >90 mL/min   TROPONIN I     Status: Abnormal   Collection Time   12/25/11  4:22 AM      Component Value Range Comment   Troponin I 0.61 (*) <0.30 ng/mL   PROTIME-INR     Status: Abnormal   Collection Time   12/25/11  4:22 AM      Component Value Range Comment   Prothrombin Time 39.6 (*) 11.6 - 15.2 seconds    INR 4.00 (*) 0.00 - 1.49   PRO B NATRIURETIC PEPTIDE     Status: Abnormal   Collection Time   12/25/11  4:22 AM      Component Value Range Comment   Pro B Natriuretic peptide (BNP) 23321.0 (*)  0 - 125 pg/mL   MRSA PCR SCREENING     Status: Normal   Collection Time   12/25/11  7:08 AM      Component Value Range Comment   MRSA by PCR NEGATIVE  NEGATIVE   GLUCOSE, CAPILLARY     Status: Normal   Collection Time   12/25/11 12:28 PM      Component Value Range Comment   Glucose-Capillary 89  70 - 99 mg/dL   CARDIAC PANEL(CRET KIN+CKTOT+MB+TROPI)     Status: Abnormal   Collection Time   12/25/11 12:40 PM      Component Value Range Comment   Total CK 47  7 - 177 U/L    CK, MB 4.3 (*) 0.3 - 4.0 ng/mL    Troponin I 0.72 (*) <0.30 ng/mL    Relative Index RELATIVE INDEX IS INVALID  0.0 - 2.5   MAGNESIUM     Status: Normal   Collection Time   12/25/11 12:40 PM      Component Value Range Comment   Magnesium 2.4  1.5 - 2.5 mg/dL   PHOSPHORUS     Status: Normal   Collection Time   12/25/11 12:40 PM      Component Value Range Comment   Phosphorus 4.6  2.3 - 4.6 mg/dL   HEPATIC FUNCTION PANEL     Status: Abnormal   Collection Time   12/25/11 12:40 PM      Component Value Range Comment   Total Protein 8.4 (*) 6.0 - 8.3 g/dL    Albumin 2.5 (*) 3.5 - 5.2 g/dL    AST 32  0 - 37 U/L    ALT 15  0 - 35 U/L    Alkaline Phosphatase 134 (*) 39 - 117 U/L    Total Bilirubin 0.5  0.3 - 1.2 mg/dL    Bilirubin, Direct 0.2  0.0 - 0.3 mg/dL    Indirect Bilirubin 0.3  0.3 - 0.9 mg/dL   CHOLESTEROL, TOTAL     Status: Normal   Collection Time   12/25/11 12:40 PM      Component Value Range Comment   Cholesterol 92  0 - 200 mg/dL   TRIGLYCERIDES     Status: Normal   Collection Time   12/25/11 12:40 PM      Component Value Range Comment   Triglycerides  124  <150 mg/dL     Dg Chest 2 View  1/61/0960  *RADIOLOGY REPORT*  Clinical Data: Short of breath.  CHEST - 2 VIEW  Comparison: 12/09/2010.  Findings: Right upper extremity PICC is present.  Moderate CHF is present with right greater than left bilateral basilar pleural effusions, atelectasis and interstitial and alveolar edema.  Aortic arch  atherosclerosis is present.  IMPRESSION: Moderate CHF/volume overload.  Original Report Authenticated By: Andreas Newport, M.D.   Assessment/Plan:    # Recent acute renal failure - Patient has not recovered her renal function from her previous acute renal failure. Etiology was not worked up. She had hematuria with negative WBC in her previous admission by UA, indicating that patient could have glomerulonephritis. On this admission, patient seems to have CHF, which may have worsened renal function by lowering the renal perfusion.  Other differential diagnoses include: Outlet obstruction, multiple myeloma (less likely, given normal calcium), acute interstitial nephritis (AIN) (less likely, no eosinophilia), cholesterol embolism (No significant risk factors for atherosclerosis, there is no sign of cholesterol embolism, such as eosinophilia and livedo reticularis), renal arterial stenosis (there is no renal artery bruit), HUS or TTP (though patient has thrombocytopenia, it has been going on since last year. Patient does not have AMS),   Plan:  -No  ACEI or ARB -continue Lasix -UA -ANA, C3, C4 and total complements, ANCA -UPEP and SPEP -Renal artery duplex -Trend Renal function test   # Anemia and thrombocytopenia: Recent anemia panel on 11/14/11 showed iron of 33 and ferritin of 289. Her vitamin 12 was normal on 11/14/11. In her previous admission, Dr Cena Benton discussed with her hematologist Dr Welton Flakes who felt her chronic pancytopenia is due to chronic underlying illness and given her anemia at baseline would not need bone marrow bx as inpatient and will plan as outpatient. Per patient, she had negative colonoscopy and EGD in the past.   -will repeat anemia panel  # Exacerbation of chronic CHF with pulmonary edema: Cardiology was consulted.  Dr. Eden Emms saw patient. He doubts ischemia given no chest pain and normal ECG. Patient is not a candidate for invasive w/u given Rx INR and elevated Cr.   -Continue iv  lasix, lows dose coreg, per primary team and cardiology   # recent candidemia - patient is currently without signs and symptoms of ongoing fungemia. On Diflucan IV per primary team.   # Short bowel syndrome - On TNA, per primary team.   Lorretta Harp, MD PGY2, Internal Medicine Teaching Service Pager: [phone]  Renal Attending History reviewed, patient examined, case discussed with Dr. Clyde Lundborg.  Agree with above.   Patient with acute kidney injury of several weeks duration in the setting of fungemia, now readmitted with pulmonary edema without improvement in renal function.  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    We will work up for paraproteinemia given lack of renal improvement, check for vasculitis, check UA and treat CHF.  Micayla Brathwaite C

## 2011-12-25 NOTE — Progress Notes (Signed)
ANTICOAGULATION CONSULT NOTE - Initial Consult  Pharmacy Consult for coumadin Indication: history of SMA occlusion   Allergies  Allergen Reactions  . Iohexol Itching    IVP Dye ok if taken with benadryl  . Penicillins Itching    Patient Measurements: Height: 5\' 8"  (172.7 cm) Weight: 161 lb 13.1 oz (73.4 kg) IBW/kg (Calculated) : 63.9   Vital Signs: Temp: 98.2 F (36.8 C) (08/15 0700) Temp src: Oral (08/15 0700) BP: 135/74 mmHg (08/15 0700) Pulse Rate: 86  (08/15 0700)  Labs:  Basename 12/25/11 0422  HGB 10.0*  HCT 30.0*  PLT 146*  APTT --  LABPROT 39.6*  INR 4.00*  HEPARINUNFRC --  CREATININE 2.50*  CKTOTAL --  CKMB --  TROPONINI 0.61*    Estimated Creatinine Clearance: 23.8 ml/min (by C-G formula based on Cr of 2.5).   Medical History: Past Medical History  Diagnosis Date  . Short bowel syndrome   . Abnormal LFTs   . Vitamin d deficiency   . Personal history of colonic polyps   . Thrombophilia   . Diabetes mellitus     resolved after weight loss  . Obesity     prior to weight loss  . Allergic rhinosinusitis   . At risk for dental problems   . Fracture of left clavicle   . Osteoporosis   . Anemia of chronic disease   . Renal insufficiency   . Atypical nevus   . Pathologic fracture of neck of femur   . Pancytopenia 10/07/2011  . Small bowel ischemia   . Superior mesenteric artery stenosis   . Bacterial overgrowth syndrome     Medications:  Prescriptions prior to admission  Medication Sig Dispense Refill  . ADULT TPN Inject 2,100 mLs into the vein continuous. Infuse over 10 hours with 1 hour ramp up and 1 hour ramp down. Total bag per day provides: 250g Dextrose 70%, 100g Travasol 10%, 45g Intralipids 30%, various electrolytes, MVI, TE, Zinc, selenium.      . calcium carbonate (TUMS EX) 750 MG chewable tablet Chew 2 tablets by mouth 3 (three) times daily. Calcium      . ciprofloxacin (CIPRO) 500 MG tablet TAKE 1 TABLET BY MOUTH TWICE  DAILY FOR 1 WEEK AT A TIME INTERMITTENTLY AS DIRECTED FOR BACTERIAL OVERGROWTH  28 tablet  4  . diphenhydrAMINE (BENADRYL) 25 mg capsule Take 25 mg by mouth every 6 (six) hours as needed. For itching/sleep.      . Eflornithine HCl (VANIQA) 13.9 % cream Apply 1 application topically 2 (two) times daily with a meal. Applied to facial hair.      . fluconazole (DIFLUCAN) 2 mg/mL IVPB Inject 100 mLs (200 mg total) into the vein daily.  900 mL  0  . Multiple Vitamin (MULTIVITAMIN WITH MINERALS) TABS Take 1 tablet by mouth daily.      Marland Kitchen omeprazole (PRILOSEC) 40 MG capsule Take 40 mg by mouth daily.      Marland Kitchen omeprazole (PRILOSEC) 40 MG capsule TAKE 1 CAPSULE BY MOUTH TWICE DAILY AT 10 AM AND 5 PM  60 capsule  10  . Parenteral Electrolytes (TPN ELECTROLYTES IV) Inject 1 each into the vein at bedtime.       . promethazine (PHENERGAN) 25 MG tablet Take 25 mg by mouth every 6 (six) hours as needed. For nausea.      . ursodiol (ACTIGALL) 300 MG capsule TAKE 1 CAPSULE BY MOUTH TWICE DAILY  60 capsule  5  . Vitamin D, Ergocalciferol, (DRISDOL)  50000 UNITS CAPS ALTERNATE TAKING 1 CAPSULE BY MOUTH EVERY OTHER DAY AND 2 CAPSULES BY MOUTH EVERY OTHER DAY  45 capsule  1  . warfarin (COUMADIN) 1 MG tablet Take 1 tablet (1 mg total) by mouth as directed.  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 She is also noted s/p bowel resection 7 years ago on TPN at home. She is here for SOB with elevated troponin and has persistent hematuria.   Goal of Therapy:  INR 2-3 Monitor platelets by anticoagulation protocol: Yes   Plan:  -Hold coumadin today -Daily PT/INR  Harland German, Pharm D 12/25/2011 8:24 AM

## 2011-12-25 NOTE — ED Provider Notes (Addendum)
History     CSN: [id number]  Arrival date & time 12/25/11  0343   First MD Initiated Contact with Patient 12/25/11 0354      Chief Complaint  Patient presents with  . 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 She's been on TPN for about 7 years. She was recently admitted to the hospital for anemia. She is here this morning with shortness of breath began yesterday. It was mild at first but is gradually worsened. It is now moderate to severe, worse with exertion or lying supine. It is not significantly improved with rest. She denies chest pain but states she feels her chest is tight. There's been no nausea or vomiting (apart from some posttussive emesis). She's had some mild diffuse abdominal pain. There are no acute complications with her TPN infusion. She has been coughing paroxysmally. She denies fever. She has persistent hematuria the etiology of which has not been determined.  Past Medical History  Diagnosis Date  . Short bowel syndrome   . Abnormal LFTs   . Vitamin d deficiency   . Personal history of colonic polyps   . Thrombophilia   . Diabetes mellitus     resolved after weight loss  . Obesity     prior to weight loss  . Allergic rhinosinusitis   . At risk for dental problems   . Fracture of left clavicle   . Osteoporosis   . Anemia of chronic disease   . Renal insufficiency   . Atypical nevus   . Pathologic fracture of neck of femur   . Pancytopenia 10/07/2011  . Small bowel ischemia   . Superior mesenteric artery stenosis   . Bacterial overgrowth syndrome     Past Surgical History  Procedure Date  . Small intestine surgery 2005    multiple with right colon resection for ischemia/infarct  . Cholecystectomy   . Orif proximal femoral fracture w/ itst nail system 03/2007   left, Dr. Simonne Come  . Esophagogastroduodenoscopy 01/22/2009    erosive esophagitis  . Colonoscopy 12/05/2005    internal hemorrhoids (for polyp surveillance)    Family History  Problem Relation Age of Onset  . Diabetes Mother   . Hypertension Mother   . Colon cancer Neg Hx     History  Substance Use Topics  . Smoking status: Current Some Day Smoker    Types: Cigarettes    Last Attempt to Quit: 05/12/2004  . Smokeless tobacco: Not on file  . Alcohol Use: No    OB History    Grav Para Term Preterm Abortions TAB SAB Ect Mult Living                  Review of Systems  All other systems reviewed and are negative.    Allergies  Iohexol and Penicillins  Home Medications   Current Outpatient Rx  Name Route Sig Dispense Refill  . ADULT TPN Intravenous Inject 2,100 mLs into the vein continuous. Infuse over 10 hours with 1 hour ramp up and 1 hour ramp down. Total bag per day provides: 250g Dextrose 70%, 100g Travasol 10%, 45g Intralipids 30%, various electrolytes, MVI, TE, Zinc, selenium.    Marland Kitchen CALCIUM CARBONATE ANTACID 750 MG PO CHEW Oral Chew 2 tablets by mouth 3 (three) times daily. Calcium    . CIPROFLOXACIN HCL 500 MG PO TABS  TAKE 1 TABLET BY  MOUTH TWICE DAILY FOR 1 WEEK AT A TIME INTERMITTENTLY AS DIRECTED FOR BACTERIAL OVERGROWTH 28 tablet 4  . DIPHENHYDRAMINE HCL 25 MG PO CAPS Oral Take 25 mg by mouth every 6 (six) hours as needed. For itching/sleep.    Marland Kitchen EFLORNITHINE HCL 13.9 % EX CREA Topical Apply 1 application topically 2 (two) times daily with a meal. Applied to facial hair.    Marland Kitchen FLUCONAZOLE IN SODIUM CHLORIDE 200-0.9 MG/100ML-% IV SOLN Intravenous Inject 100 mLs (200 mg total) into the vein daily. 900 mL 0  . ADULT MULTIVITAMIN W/MINERALS CH Oral Take 1 tablet by mouth daily.    Marland Kitchen OMEPRAZOLE 40 MG PO CPDR Oral Take 40 mg by mouth daily.    Marland Kitchen OMEPRAZOLE 40 MG PO CPDR  TAKE 1 CAPSULE BY MOUTH TWICE DAILY AT 10 AM AND 5 PM 60 capsule 10  . TPN ELECTROLYTES IV  Intravenous Inject 1 each into the vein at bedtime.     Marland Kitchen PROMETHAZINE HCL 25 MG PO TABS Oral Take 25 mg by mouth every 6 (six) hours as needed. For nausea.    Marland Kitchen URSODIOL 300 MG PO CAPS  TAKE 1 CAPSULE BY MOUTH TWICE DAILY 60 capsule 5  . VITAMIN D (ERGOCALCIFEROL) 50000 UNITS PO CAPS  ALTERNATE TAKING 1 CAPSULE BY MOUTH EVERY OTHER DAY AND 2 CAPSULES BY MOUTH EVERY OTHER DAY 45 capsule 1  . WARFARIN SODIUM 1 MG PO TABS Oral Take 1 tablet (1 mg total) by mouth as directed. 20 tablet 0    BP 136/90  Pulse 90  Temp 98.9 F (37.2 C) (Oral)  Resp 18  SpO2 96%  Physical Exam General: Well-developed, well-nourished female in no acute distress; appearance consistent with age of record HENT: normocephalic, atraumatic Eyes: pupils equal round and reactive to light; extraocular muscles intact Neck: supple Heart: regular rate and rhythm Lungs: Shallow respirations; coughing with attempted deep breathing Abdomen: soft; nondistended; mild diffuse tender; bowel sounds present Extremities: No deformity; full range of motion; pulses normal; no edema; PICC line right upper extremity Neurologic: Awake, alert and oriented; motor function intact in all extremities and symmetric; no facial droop Skin: Warm and dry Psychiatric: Anxious    ED Course  Procedures (including critical care time)     MDM   Nursing notes and vitals signs, including pulse oximetry, reviewed.  Summary of this visit's results, reviewed by myself:  Labs:  Results for orders placed during the hospital encounter of 12/25/11  CBC WITH DIFFERENTIAL      Component Value Range   WBC 7.0  4.0 - 10.5 K/uL   RBC 3.46 (*) 3.87 - 5.11 MIL/uL   Hemoglobin 10.0 (*) 12.0 - 15.0 g/dL   HCT 16.1 (*) 09.6 - 04.5 %   MCV 86.7  78.0 - 100.0 fL   MCH 28.9  26.0 - 34.0 pg   MCHC 33.3  30.0 - 36.0 g/dL   RDW 40.9 (*) 81.1 - 91.4 %   Platelets 146 (*) 150 - 400 K/uL   Neutrophils Relative 57  43 - 77 %   Neutro Abs 4.0  1.7 - 7.7  K/uL   Lymphocytes Relative 28  12 - 46 %   Lymphs Abs 1.9  0.7 - 4.0 K/uL   Monocytes Relative 8  3 - 12 %   Monocytes Absolute 0.6  0.1 - 1.0 K/uL   Eosinophils Relative 6 (*) 0 - 5 %   Eosinophils Absolute 0.4  0.0 - 0.7 K/uL   Basophils Relative  1  0 - 1 %   Basophils Absolute 0.0  0.0 - 0.1 K/uL  BASIC METABOLIC PANEL      Component Value Range   Sodium 132 (*) 135 - 145 mEq/L   Potassium 3.5  3.5 - 5.1 mEq/L   Chloride 95 (*) 96 - 112 mEq/L   CO2 28  19 - 32 mEq/L   Glucose, Bld 196 (*) 70 - 99 mg/dL   BUN 33 (*) 6 - 23 mg/dL   Creatinine, Ser 3.24 (*) 0.50 - 1.10 mg/dL   Calcium 8.8  8.4 - 40.1 mg/dL   GFR calc non Af Amer 20 (*) >90 mL/min   GFR calc Af Amer 23 (*) >90 mL/min  TROPONIN I      Component Value Range   Troponin I 0.61 (*) <0.30 ng/mL  PROTIME-INR      Component Value Range   Prothrombin Time 39.6 (*) 11.6 - 15.2 seconds   INR 4.00 (*) 0.00 - 1.49  PRO B NATRIURETIC PEPTIDE      Component Value Range   Pro B Natriuretic peptide (BNP) 23321.0 (*) 0 - 125 pg/mL    Imaging Studies: Dg Chest 2 View  12/25/2011  *RADIOLOGY REPORT*  Clinical Data: Short of breath.  CHEST - 2 VIEW  Comparison: 12/09/2010.  Findings: Right upper extremity PICC is present.  Moderate CHF is present with right greater than left bilateral basilar pleural effusions, atelectasis and interstitial and alveolar edema.  Aortic arch atherosclerosis is present.  IMPRESSION: Moderate CHF/volume overload.  Original Report Authenticated By: Andreas Newport, M.D.      EKG Interpretation:  Date & Time: 12/25/2011 4:07 AM  Rate: 91  Rhythm: normal sinus rhythm  QRS Axis: normal  Intervals: normal  ST/T Wave abnormalities: normal  Conduction Disutrbances:none  Narrative Interpretation: Low-voltage QRS; poor R-wave progression  Old EKG Reviewed: Axis is shifted; previous EKG had suspected arm lead reversal  5:05 AM Some relief with albuterol neb treatment. BMP and chest x-ray consistent  with CHF, will administer Lasix.  5:17 AM Dr. Tresa Endo of [facility] will consult. He requests that the hospitalist service admit as the patient was just discharged from the hospitalist service recently and has multiple medical problems.  5:32 AM Dr. Sharyon Medicus of [facility] accepts for transfer to [facility].         Hanley Seamen, MD 12/25/11 0272  Hanley Seamen, MD 12/25/11 5366

## 2011-12-25 NOTE — Telephone Encounter (Signed)
Daughter called to inform that her mother will not be at appointment today d/t patient currently admitted at [facility] ICU room # [phone]

## 2011-12-25 NOTE — ED Notes (Signed)
Critical troponin result  0.61, dr. Read Drivers notified

## 2011-12-25 NOTE — ED Notes (Signed)
Pt reports feeling short of breath x 2 days, progressively increasing dyspnea

## 2011-12-25 NOTE — Consult Note (Signed)
CARDIOLOGY CONSULT NOTE    Patient ID: Dane C Rosengren MRN: [id number] DOB/AGE: Apr 19, 1951 61 y.o.  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  No chest pain.  History of CRF, chronic anticoagulation for PIC line.  On TPN for short gut syndrome.  BNP over 25,000.  Previous Echo with EF 45% no history of CAD.  No chest pain palpitatons or edema.  Feels much better this am with good diuretic respnonse to lasix.  No acute ECG changes and negative enzymes on admission F/U echo is pending.  Denies prior PND, orthopnea, does have chronic anemia.    @ROS @ All other systems reviewed and negative except as noted above  Past Medical History  Diagnosis Date  . Short bowel syndrome   . Abnormal LFTs   . Vitamin d deficiency   . Personal history of colonic polyps   . Thrombophilia   . Diabetes mellitus     resolved after weight loss  . Obesity     prior to weight loss  . Allergic rhinosinusitis   . At risk for dental problems   . Fracture of left clavicle   . Osteoporosis   . Anemia of chronic disease   . Renal insufficiency   . Atypical nevus   . Pathologic fracture of neck of femur   . Pancytopenia 10/07/2011  . Small bowel ischemia   . Superior mesenteric artery stenosis   . Bacterial overgrowth syndrome     Family History  Problem Relation Age of Onset  . Diabetes Mother   . Hypertension Mother   . Colon cancer Neg Hx     History   Social History  . Marital Status: Married    Spouse Name: N/A    Number of Children:  N/A  . Years of Education: N/A   Occupational History  . Not on file.   Social History Main Topics  . Smoking status: Current Some Day Smoker    Types: Cigarettes    Last Attempt to Quit: 05/12/2004  . Smokeless tobacco: Not on file  . Alcohol Use: No  . Drug Use: No  . Sexually Active: Not on file   Other Topics Concern  . Not on file   Social History Narrative  . No narrative on file    Past Surgical History  Procedure Date  . Small intestine surgery 2005    multiple with right colon resection for ischemia/infarct  . Cholecystectomy   . Orif proximal femoral fracture w/ itst nail system 03/2007    left, Dr. Simonne Come  . Esophagogastroduodenoscopy 01/22/2009    erosive esophagitis  . Colonoscopy 12/05/2005    internal hemorrhoids (for polyp surveillance)        . albuterol  5 mg Nebulization Once  . albuterol      . aspirin EC  81 mg Oral Daily  . calcium carbonate  3 tablet Oral TID WC  . carvedilol  3.125 mg Oral BID WC  . fluconazole  200 mg Intravenous Q24H  . furosemide  40 mg Intravenous Once  . furosemide  60 mg Intravenous BID  .  ipratropium  0.5 mg Nebulization Once  . multivitamin with minerals  1 tablet Oral Daily  . pantoprazole  80 mg Oral BID AC  . potassium chloride  10 mEq Oral Daily  . sodium chloride  3 mL Intravenous Q12H  . Vitamin D (Ergocalciferol)  50,000 Units Oral Q7 days  . DISCONTD: sodium chloride   Intravenous Once  . DISCONTD: calcium carbonate  2 tablet Oral TID  . DISCONTD: ipratropium          . ADULT TPN      Physical Exam: Blood pressure 135/74, pulse 86, temperature 98.2 F (36.8 C), temperature source Oral, resp. rate 22, height 5\' 8"  (1.727 m), weight 161 lb 13.1 oz (73.4 kg), SpO2 95.00%.   Affect appropriate Healthy:  appears stated age HEENT: normal Neck supple with no adenopathy JVP normal no bruits no thyromegaly Lungs clear with no wheezing and good diaphragmatic motion Heart:  S1/S2 no murmur, no rub,  gallop or click PMI normal Abdomen: benighn, BS positve, no tenderness, no AAA no bruit.  No HSM or HJR Distal pulses intact with no bruits No edema Neuro non-focal Skin warm and dry No muscular weakness PIC line LUE   Labs:   Lab Results  Component Value Date   WBC 7.0 12/25/2011   HGB 10.0* 12/25/2011   HCT 30.0* 12/25/2011   MCV 86.7 12/25/2011   PLT 146* 12/25/2011    Lab 12/25/11 0422  NA 132*  K 3.5  CL 95*  CO2 28  BUN 33*  CREATININE 2.50*  CALCIUM 8.8  PROT --  BILITOT --  ALKPHOS --  ALT --  AST --  GLUCOSE 196*   Lab Results  Component Value Date   TROPONINI 0.61* 12/25/2011    Lab Results  Component Value Date   CHOL 46 12/10/2011      Radiology: Dg Chest 2 View  12/25/2011  *RADIOLOGY REPORT*  Clinical Data: Short of breath.  CHEST - 2 VIEW  Comparison: 12/09/2010.  Findings: Right upper extremity PICC is present.  Moderate CHF is present with right greater than left bilateral basilar pleural effusions, atelectasis and interstitial and alveolar edema.  Aortic arch atherosclerosis is present.  IMPRESSION: Moderate CHF/volume overload.  Original Report Authenticated By: Andreas Newport, M.D.   Dg Chest 2 View  12/09/2011  *RADIOLOGY REPORT*  Clinical Data: Fever and weakness.  Ex-smoker.  CHEST - 2 VIEW  Comparison: CT of 10/24/2011 and plain film of 01/17/2011.  Findings: A left-sided PICC line which is unchanged and terminates at the low SVC. Midline trachea.  Borderline cardiomegaly.  Age advanced aortic atherosclerosis. No pleural effusion or pneumothorax.  The reticular nodular opacities described on the prior plain film are improved to resolved.  Mild interstitial prominence remains, and is likely related to the clinical history of  prior smoking. No lobar consolidation.  IMPRESSION: Interstitial thickening, likely related to prior smoking / chronic bronchitis.  No evidence of acute superimposed pneumonia.  Original Report Authenticated By: Consuello Bossier,  M.D.   US Renal  12/09/2011  *RADIOLOGY REPORT*  Clinical Data: Hematuria.  History of diabetes.  RENAL/URINARY TRACT ULTRASOUND COMPLETE  Comparison:  Abdominal ultrasound 10/29/2011.  Findings:  Right Kidney:  Mild diffuse increased echogenicity of the renal parenchyma is nonspecific.  No focal cystic or solid renal lesions. 12.9 cm in length.  No hydronephrosis. No definite calculi.  Left Kidney:  Mild diffuse increased echogenicity of the renal parenchyma is nonspecific.  No focal cystic or solid renal  lesions. 13.6 cm in length.  No hydronephrosis. No definite calculi.  Bladder:  Well distended without focal wall abnormalities.  IMPRESSION: 1.  Mild diffusely increased echogenicity of the the renal parenchyma bilaterally is nonspecific, but could suggest underlying medical renal disease. 2.  No focal cystic or solid renal lesions, and no definite evidence of calculi within the collecting systems.  Original Report Authenticated By: Florencia Reasons, M.D.   Ir Fluoro Guide Cv Line Right  12/17/2011  *RADIOLOGY REPORT*  Clinical Data: Total parenteral nutrition requirements. Malnutrition.  PICC LINE PLACEMENT WITH ULTRASOUND AND FLUOROSCOPIC  GUIDANCE  Fluoroscopy Time: 1.1 minutes.  The right arm was prepped with chlorhexidine, draped in the usual sterile fashion using maximum barrier technique (cap and mask, sterile gown, sterile gloves, large sterile sheet, hand hygiene and cutaneous antisepsis) and infiltrated locally with 1% Lidocaine.  Ultrasound demonstrated patency of the right basilic vein, and this was documented with an image.  Under real-time ultrasound guidance, this vein was accessed with a 21 gauge micropuncture needle and image documentation was performed.  The needle was exchanged over a guidewire for a peel-away sheath through which a five [place] double lumen PICC trimmed to 33 cm was advanced, positioned with its tip at the lower SVC/right atrial junction.  Fluoroscopy during the  procedure and fluoro spot radiograph confirms appropriate catheter position.  The catheter was flushed, secured to the skin with Prolene sutures, and covered with a sterile dressing.  Complications:  None.  IMPRESSION: Successful right arm PICC line placement with ultrasound and fluoroscopic guidance.  The catheter is ready for use.  Original Report Authenticated By: Donavan Burnet, M.D.   Ir US Guide Vasc Access Right  12/17/2011  *RADIOLOGY REPORT*  Clinical Data: Total parenteral nutrition requirements. Malnutrition.  PICC LINE PLACEMENT WITH ULTRASOUND AND FLUOROSCOPIC  GUIDANCE  Fluoroscopy Time: 1.1 minutes.  The right arm was prepped with chlorhexidine, draped in the usual sterile fashion using maximum barrier technique (cap and mask, sterile gown, sterile gloves, large sterile sheet, hand hygiene and cutaneous antisepsis) and infiltrated locally with 1% Lidocaine.  Ultrasound demonstrated patency of the right basilic vein, and this was documented with an image.  Under real-time ultrasound guidance, this vein was accessed with a 21 gauge micropuncture needle and image documentation was performed.  The needle was exchanged over a guidewire for a peel-away sheath through which a five [place] double lumen PICC trimmed to 33 cm was advanced, positioned with its tip at the lower SVC/right atrial junction.  Fluoroscopy during the procedure and fluoro spot radiograph confirms appropriate catheter position.  The catheter was flushed, secured to the skin with Prolene sutures, and covered with a sterile dressing.  Complications:  None.  IMPRESSION: Successful right arm PICC line placement with ultrasound and fluoroscopic guidance.  The catheter is ready for use.  Original Report Authenticated By: Donavan Burnet, M.D.    EKG: NSR rate 91 normal ECG 8/15   ASSESSMENT AND PLAN:  CHF:  Etiology not clear.  Doubt ischemia with no chest pain and normal ECG.  F/U echo.  Not a candidate for ACE with deteriorating Cr.   Continue bid iv lasix.   Continue lows dose coreg Will consider adding nitrates  in next 48 hours.  Not a candidate for invasive w/u given Rx INR and elevated Cr Cr:  Cr was normal in March  Would have nephrology see as her "new"renal dysfunction is contributing to her volume overload and limiting Rx Short Gut:  Continue  TPN.  Chronic coumadin for PIC line patency.  This PIC line is new.    SignedCharlton Haws 12/25/2011, 9:06 AM

## 2011-12-25 NOTE — H&P (Signed)
[facility] History and Physical  [name] [name] EXB:[id number] DOB: 1950/05/17 DOA: 12/25/2011   PCP: [name], MD   Chief Complaint:  Chief Complaint  Patient presents with  . Shortness of Breath     HPI:  61 year old woman with history of short bowel syndrome TPN dependent,  who was recently hospitalized for fungemia and acute renal failure presented to the emergency room last night complaining of progressive shortness of breath and coughing. She was vigorously hydrated during the prior admission. Patient denies any chest pain and she does not have any cardiac history she knows of. In the emergency room she was found to be in pulmonary edema referred for admission  Review of Systems:  Positive for chronic diarrhea,  All other systems reviewed and negative or as per history of present illness  Past Medical History  Diagnosis Date  . Short bowel syndrome   . Abnormal LFTs   . Vitamin d deficiency   . Personal history of colonic polyps   . Thrombophilia   . Diabetes mellitus     resolved after weight loss  . Obesity     prior to weight loss  . Allergic rhinosinusitis   . At risk for dental problems   . Fracture of left clavicle   . Osteoporosis   . Anemia of chronic disease   . Renal insufficiency   . Atypical nevus   . Pathologic fracture of neck of femur   . Pancytopenia 10/07/2011  . Small bowel ischemia   . Superior mesenteric artery stenosis   . Bacterial overgrowth syndrome    Past Surgical History  Procedure Date  . Small intestine surgery 2005    multiple with right colon resection for ischemia/infarct  . Cholecystectomy   . Orif proximal femoral fracture w/ itst nail system 03/2007    left, Dr. Simonne Come  . Esophagogastroduodenoscopy 01/22/2009    erosive esophagitis  . Colonoscopy 12/05/2005    internal hemorrhoids (for polyp surveillance)   Social History:  reports that she has been smoking Cigarettes.  She does not have any smokeless  tobacco history on file. She reports that she does not drink alcohol or use illicit drugs. The patient lives at home with her husband and is independent with her activities of daily living  Allergies  Allergen Reactions  . Iohexol Itching    IVP Dye ok if taken with benadryl  . Penicillins Itching    Family History  Problem Relation Age of Onset  . Diabetes Mother   . Hypertension Mother   . Colon cancer Neg Hx      Prior to Admission medications   Medication Sig Start Date End Date Taking? Authorizing Provider  ADULT TPN Inject 2,100 mLs into the vein continuous. Infuse over 10 hours with 1 hour ramp up and 1 hour ramp down. Total bag per day provides: 250g Dextrose 70%, 100g Travasol 10%, 45g Intralipids 30%, various electrolytes, MVI, TE, Zinc, selenium.    Historical Provider, MD  calcium carbonate (TUMS EX) 750 MG chewable tablet Chew 2 tablets by mouth 3 (three) times daily. Calcium    Historical Provider, MD  ciprofloxacin (CIPRO) 500 MG tablet TAKE 1 TABLET BY MOUTH TWICE DAILY FOR 1 WEEK AT A TIME INTERMITTENTLY AS DIRECTED FOR BACTERIAL OVERGROWTH 11/10/11   Iva Boop, MD  diphenhydrAMINE (BENADRYL) 25 mg capsule Take 25 mg by mouth every 6 (six) hours as needed. For itching/sleep.    Historical Provider, MD  Eflornithine HCl (VANIQA) 13.9 %  cream Apply 1 application topically 2 (two) times daily with a meal. Applied to facial hair.    Historical Provider, MD  fluconazole (DIFLUCAN) 2 mg/mL IVPB Inject 100 mLs (200 mg total) into the vein daily. 12/17/11 12/26/11  Penny Pia, MD  Multiple Vitamin (MULTIVITAMIN WITH MINERALS) TABS Take 1 tablet by mouth daily.    Historical Provider, MD  omeprazole (PRILOSEC) 40 MG capsule Take 40 mg by mouth daily. 11/20/10   Iva Boop, MD  omeprazole (PRILOSEC) 40 MG capsule TAKE 1 CAPSULE BY MOUTH TWICE DAILY AT 10 AM AND 5 PM 12/19/11   Iva Boop, MD  Parenteral Electrolytes (TPN ELECTROLYTES IV) Inject 1 each into the vein at  bedtime.     Historical Provider, MD  promethazine (PHENERGAN) 25 MG tablet Take 25 mg by mouth every 6 (six) hours as needed. For nausea.    Historical Provider, MD  ursodiol (ACTIGALL) 300 MG capsule TAKE 1 CAPSULE BY MOUTH TWICE DAILY 07/11/11   Iva Boop, MD  Vitamin D, Ergocalciferol, (DRISDOL) 50000 UNITS CAPS ALTERNATE TAKING 1 CAPSULE BY MOUTH EVERY OTHER DAY AND 2 CAPSULES BY MOUTH EVERY OTHER DAY 10/15/11   Iva Boop, MD  warfarin (COUMADIN) 1 MG tablet Take 1 tablet (1 mg total) by mouth as directed. 12/17/11 12/16/12  Penny Pia, MD   Physical Exam: Filed Vitals:   12/25/11 0348 12/25/11 0410 12/25/11 0558 12/25/11 0700  BP: 136/90  150/94 135/74  Pulse: 90  90 86  Temp: 98.9 F (37.2 C)  98.7 F (37.1 C) 98.2 F (36.8 C)  TempSrc: Oral   Oral  Resp: 18  22 22   Height:    5\' 8"  (1.727 m)  Weight:    73.4 kg (161 lb 13.1 oz)  SpO2: 98% 96% 98% 95%     General:  Alert and oriented x3  Eyes: Pupil equal round react to light accommodation, extraocular movements intact  ENT: No pharyngeal exudate, some missing teeth  Neck: No JVD  Cardiovascular: Regular rate and rhythm, no murmurs, S3 gallop  Respiratory: Bilateral widespread crackles  Abdomen: Soft nontender, hyperactive bowel sounds  Skin: Pale dry  Musculoskeletal: Intact  Psychiatric: Euthymic  Neurologic: Strength 5 out of 5 in all 4 extremities, cranial nerves 2-12 intact, sensation intact  Labs on Admission:  Basic Metabolic Panel:  Lab 12/25/11 1610  NA 132*  K 3.5  CL 95*  CO2 28  GLUCOSE 196*  BUN 33*  CREATININE 2.50*  CALCIUM 8.8  MG --  PHOS --   Liver Function Tests: No results found for this basename: AST:5,ALT:5,ALKPHOS:5,BILITOT:5,PROT:5,ALBUMIN:5 in the last 168 hours No results found for this basename: LIPASE:5,AMYLASE:5 in the last 168 hours No results found for this basename: AMMONIA:5 in the last 168 hours CBC:  Lab 12/25/11 0422  WBC 7.0  NEUTROABS 4.0  HGB 10.0*    HCT 30.0*  MCV 86.7  PLT 146*   Cardiac Enzymes:  Lab 12/25/11 0422  CKTOTAL --  CKMB --  CKMBINDEX --  TROPONINI 0.61*    BNP (last 3 results)  Basename 12/25/11 0422  PROBNP 23321.0*   CBG: No results found for this basename: GLUCAP:5 in the last 168 hours  Radiological Exams on Admission: Dg Chest 2 View  12/25/2011  *RADIOLOGY REPORT*  Clinical Data: Short of breath.  CHEST - 2 VIEW  Comparison: 12/09/2010.  Findings: Right upper extremity PICC is present.  Moderate CHF is present with right greater than left bilateral basilar pleural effusions,  atelectasis and interstitial and alveolar edema.  Aortic arch atherosclerosis is present.  IMPRESSION: Moderate CHF/volume overload.  Original Report Authenticated By: Andreas Newport, M.D.    EKG: Independently reviewed. 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 Acute on chronic systolic congestive heart failure exacerbation with pulmonary edema. The cause is most likely due to volume overload from generous IV fluid administration. I cannot rule out silent ischemia as another cause for exacerbation. Plan to admit the patient to step down unit, placed on supplemental oxygen, start IV diuretics, start oral aspirin. The patient is already fully anticoagulated with Coumadin so I am not going to start heparin . We will start low-dose beta blocker with Coreg 3.125 twice daily. Cardiology consultation has been obtained with the [facility]. We will repeat an echocardiogram to assure none of the valves have been affected by the fungemia. 2. recent  candidemia - patient is currently without signs and symptoms of ongoing fungemia. I will continue Diflucan IV 3. short bowel syndrome - we'll continue TNA per pharmacy 4. thrombophilia, continue full dose Coumadin 5. recent acute renal failure - patient has not recovered her renal function 100%. Will need close monitoring of creatinine with aggressive diuresis   Code Status: Full code Family Communication: Husband at bedside Disposition Plan: Home  Time spent: One hour  Schneur Crowson [facility] Pager [phone]  If 7PM-7AM, please contact night-coverage [email or web address] Password [email or web address] 12/25/2011, 8:36 AM

## 2011-12-26 ENCOUNTER — Inpatient Hospital Stay (HOSPITAL_COMMUNITY): Payer: BC Managed Care – PPO

## 2011-12-26 ENCOUNTER — Other Ambulatory Visit: Payer: Self-pay | Admitting: Ophthalmology

## 2011-12-26 DIAGNOSIS — N19 Unspecified kidney failure: Secondary | ICD-10-CM

## 2011-12-26 DIAGNOSIS — N179 Acute kidney failure, unspecified: Secondary | ICD-10-CM

## 2011-12-26 DIAGNOSIS — R079 Chest pain, unspecified: Secondary | ICD-10-CM

## 2011-12-26 LAB — CARDIAC PANEL(CRET KIN+CKTOT+MB+TROPI): Total CK: 39 U/L (ref 7–177)

## 2011-12-26 LAB — CBC WITH DIFFERENTIAL/PLATELET
Basophils Relative: 1 % (ref 0–1)
Eosinophils Absolute: 0.3 10*3/uL (ref 0.0–0.7)
Eosinophils Relative: 7 % — ABNORMAL HIGH (ref 0–5)
MCH: 28.7 pg (ref 26.0–34.0)
MCHC: 33.3 g/dL (ref 30.0–36.0)
MCV: 86.1 fL (ref 78.0–100.0)
Neutrophils Relative %: 49 % (ref 43–77)
Platelets: 124 10*3/uL — ABNORMAL LOW (ref 150–400)
RBC: 2.96 MIL/uL — ABNORMAL LOW (ref 3.87–5.11)
RDW: 16.4 % — ABNORMAL HIGH (ref 11.5–15.5)

## 2011-12-26 LAB — FERRITIN: Ferritin: 691 ng/mL — ABNORMAL HIGH (ref 10–291)

## 2011-12-26 LAB — RENAL FUNCTION PANEL
Albumin: 2.4 g/dL — ABNORMAL LOW (ref 3.5–5.2)
BUN: 33 mg/dL — ABNORMAL HIGH (ref 6–23)
Phosphorus: 4.8 mg/dL — ABNORMAL HIGH (ref 2.3–4.6)
Potassium: 3.4 mEq/L — ABNORMAL LOW (ref 3.5–5.1)
Sodium: 133 mEq/L — ABNORMAL LOW (ref 135–145)

## 2011-12-26 LAB — GLUCOSE, CAPILLARY
Glucose-Capillary: 108 mg/dL — ABNORMAL HIGH (ref 70–99)
Glucose-Capillary: 138 mg/dL — ABNORMAL HIGH (ref 70–99)

## 2011-12-26 LAB — IRON AND TIBC
Iron: 70 ug/dL (ref 42–135)
Saturation Ratios: 31 % (ref 20–55)
TIBC: 228 ug/dL — ABNORMAL LOW (ref 250–470)
UIBC: 158 ug/dL (ref 125–400)

## 2011-12-26 LAB — C4 COMPLEMENT: Complement C4, Body Fluid: 22 mg/dL (ref 10–40)

## 2011-12-26 LAB — PROTIME-INR: INR: 1.77 — ABNORMAL HIGH (ref 0.00–1.49)

## 2011-12-26 LAB — ANCA SCREEN W REFLEX TITER: Atypical p-ANCA Screen: NEGATIVE

## 2011-12-26 MED ORDER — SELENIUM 40 MCG/ML IV SOLN
INTRAVENOUS | Status: AC
  Administered 2011-12-26: 17:00:00 via INTRAVENOUS
  Filled 2011-12-26: qty 2000

## 2011-12-26 MED ORDER — WARFARIN - PHARMACIST DOSING INPATIENT
Freq: Every day | Status: DC
  Administered 2011-12-27: 18:00:00

## 2011-12-26 MED ORDER — POTASSIUM CHLORIDE CRYS ER 20 MEQ PO TBCR
40.0000 meq | EXTENDED_RELEASE_TABLET | Freq: Two times a day (BID) | ORAL | Status: AC
  Administered 2011-12-26 – 2011-12-27 (×3): 40 meq via ORAL
  Filled 2011-12-26 (×3): qty 2

## 2011-12-26 MED ORDER — REGADENOSON 0.4 MG/5ML IV SOLN
INTRAVENOUS | Status: AC
  Filled 2011-12-26: qty 5

## 2011-12-26 MED ORDER — WARFARIN SODIUM 2 MG PO TABS
2.0000 mg | ORAL_TABLET | Freq: Once | ORAL | Status: AC
  Administered 2011-12-26: 2 mg via ORAL
  Filled 2011-12-26: qty 1

## 2011-12-26 MED ORDER — TECHNETIUM TC 99M TETROFOSMIN IV KIT
10.0000 | PACK | Freq: Once | INTRAVENOUS | Status: AC | PRN
  Administered 2011-12-26: 10 via INTRAVENOUS

## 2011-12-26 MED ORDER — TECHNETIUM TC 99M TETROFOSMIN IV KIT
30.0000 | PACK | Freq: Once | INTRAVENOUS | Status: AC | PRN
  Administered 2011-12-26: 30 via INTRAVENOUS

## 2011-12-26 MED ORDER — FAT EMULSION 20 % IV EMUL
250.0000 mL | INTRAVENOUS | Status: AC
  Administered 2011-12-26: 250 mL via INTRAVENOUS
  Filled 2011-12-26 (×2): qty 250

## 2011-12-26 MED ORDER — REGADENOSON 0.4 MG/5ML IV SOLN
0.4000 mg | Freq: Once | INTRAVENOUS | Status: AC
  Administered 2011-12-26: 0.4 mg via INTRAVENOUS
  Filled 2011-12-26: qty 5

## 2011-12-26 NOTE — Progress Notes (Signed)
Lexiscan completed. EKG nonspecific changes pre-test but remained stable through test. Pt had SOB, cough, abdominal cramping that resolved spontaneously in recovery. VSS. Await images. Dayna Dunn PA-C

## 2011-12-26 NOTE — Progress Notes (Addendum)
[facility]  Subjective:  Awake, alert, was in nuclear med for several hours today.  Feels fine   Objective: Vital signs in last 24 hours: Blood pressure 100/64, pulse 82, temperature 98.4 F (36.9 C), temperature source Oral, resp. rate 18, height 5\' 8"  (1.727 m), weight 73.4 kg (161 lb 13.1 oz), SpO2 99.00%.    PHYSICAL EXAM General--as above Chest--clear Heart--no rub Abd--nontender Extr--no edema, PICC R antecub  Lab Results:   Lab 12/26/11 0520 12/25/11 1240 12/25/11 0422  NA 133* -- 132*  K 3.4* -- 3.5  CL 97 -- 95*  CO2 27 -- 28  BUN 33* -- 33*  CREATININE 2.53* -- 2.50*  ALB -- -- --  GLUCOSE 126* -- --  CALCIUM 8.3* -- 8.8  PHOS 4.8* 4.6 --     Basename 12/26/11 0520 12/25/11 0422  WBC 4.4 7.0  HGB 8.5* 10.0*  HCT 25.5* 30.0*  PLT 124* 146*    Assessment/Plan: 1. Recent acute renal failure - Cr was 1.13 on 28 May and has been > 2.2 since 29 July.  Recommendations made yesterday are as follows: -No ACEI or ARB  -continue Lasix  -UA-- 3-6 WBC, 20-50 RBC, 3+ proteinuria -ANA, C3, C4 and total complements, ANCA  -UPEP and SPEP  -Renal artery duplex  -Trend Renal function test  KCl 40 BID today in addition to 10/d already ordered.  Check Mg 2.   Anemia and thrombocytopenia: .  -will repeat anemia panel (Fe/TIBC/ferritin pending).  Will rx IV Fe if Fe stores low; aranesp if Fe stores adequate 3.   Exacerbation of chronic CHF with pulmonary edema: Cardiology was consulted. Dr. Eden Emms saw patient.  "Doubt ischemia given no chest pain and normal ECG. Patient is not a candidate for invasive w/u given Rx INR and elevated Cr." -Continue iv lasix, lows dose coreg, per primary team and cardiology.  Lexiscan done earlier today--results pending  4.   Recent candidemia - patient is currently without signs and symptoms of ongoing fungemia. On Diflucan IV per primary team. PICC line R antecub 5.  Short bowel syndrome - On TNA, per primary team.    LOS: 1 day   Alcee Sipos F 12/26/2011,2:05 PM   .labalb

## 2011-12-26 NOTE — Progress Notes (Signed)
INITIAL ADULT NUTRITION ASSESSMENT Date: 12/26/2011   Time: 10:19 AM Reason for Assessment: Home TPN  INTERVENTION: 1. TPN per pharmacy 2. Recommend change oral multivitamin to another form or increase dose, likely not able to absorb PO tablet.  3. RD will continue to follow   ASSESSMENT: Female 61 y.o.  Dx: Pulmonary edema  Hx:  Past Medical History  Diagnosis Date  . Short bowel syndrome   . Abnormal LFTs   . Vitamin d deficiency   . Personal history of colonic polyps   . Thrombophilia   . Diabetes mellitus     resolved after weight loss  . Obesity     prior to weight loss  . Allergic rhinosinusitis   . At risk for dental problems   . Fracture of left clavicle   . Osteoporosis   . Anemia of chronic disease   . Renal insufficiency   . Atypical nevus   . Pathologic fracture of neck of femur   . Pancytopenia 10/07/2011  . Small bowel ischemia   . Superior mesenteric artery stenosis   . Bacterial overgrowth syndrome     Related Meds:     . albuterol      . aspirin EC  81 mg Oral Daily  . calcium carbonate  3 tablet Oral TID WC  . carvedilol  3.125 mg Oral BID WC  . fluconazole  200 mg Intravenous Q24H  . furosemide  60 mg Intravenous BID  . insulin aspart  0-9 Units Subcutaneous Q6H  . multivitamin with minerals  1 tablet Oral Daily  . pantoprazole  80 mg Oral BID AC  . potassium chloride  10 mEq Oral Daily  . sodium chloride  3 mL Intravenous Q12H  . Vitamin D (Ergocalciferol)  50,000 Units Oral Q7 days     Ht: 5\' 8"  (172.7 cm)  Wt: 161 lb 13.1 oz (73.4 kg)  Ideal Wt: 63.6 kg  % Ideal Wt: 115%  Usual Wt:  Wt Readings from Last 5 Encounters:  12/25/11 161 lb 13.1 oz (73.4 kg)  12/17/11 165 lb 9.1 oz (75.1 kg)  12/01/11 156 lb 4 oz (70.875 kg)  11/17/11 157 lb 9.6 oz (71.487 kg)  10/10/11 156 lb 6.4 oz (70.943 kg)    % Usual Wt: 103%  Body mass index is 24.60 kg/(m^2). Pt is WNL per BMI  Food/Nutrition Related Hx: Pt is on home TPN x 7 years     Labs:  CMP     Component Value Date/Time   NA 133* 12/26/2011 0520   NA 140 03/01/2009 1029   K 3.4* 12/26/2011 0520   K 4.2 03/01/2009 1029   CL 97 12/26/2011 0520   CL 104 03/01/2009 1029   CO2 27 12/26/2011 0520   CO2 25 03/01/2009 1029   GLUCOSE 126* 12/26/2011 0520   GLUCOSE 82 05/07/2010   BUN 33* 12/26/2011 0520   BUN 17 03/01/2009 1029   CREATININE 2.53* 12/26/2011 0520   CREATININE 0.5* 03/01/2009 1029   CALCIUM 8.3* 12/26/2011 0520   CALCIUM 8.7 03/01/2009 1029   PROT 8.4* 12/25/2011 1240   PROT 7.9 03/01/2009 1029   ALBUMIN 2.4* 12/26/2011 0520   AST 32 12/25/2011 1240   AST 46* 03/01/2009 1029   ALT 15 12/25/2011 1240   ALKPHOS 134* 12/25/2011 1240   ALKPHOS 143* 03/01/2009 1029   BILITOT 0.5 12/25/2011 1240   BILITOT 0.40 03/01/2009 1029   GFRNONAA 19* 12/26/2011 0520   GFRAA 22* 12/26/2011 0520    Intake/Output  Summary (Last 24 hours) at 12/26/11 1031 Last data filed at 12/26/11 8119  Gross per 24 hour  Intake   1210 ml  Output   3800 ml  Net  -2590 ml     Diet Order: Cardiac  Supplements/Tube Feeding: none  IVF:    fat emulsion   TPN (CLINIMIX) +/- additives Last Rate: Stopped (12/26/11 0800)  TPN (CLINIMIX) +/- additives   DISCONTD: ADULT TPN     Estimated Nutritional Needs:   Kcal: 1600-1750 Protein: 59-73 gm  Fluid: 1.6-1.8 L  Pt with recent admission, now with volume excess likely from aggressive fluids. Diuresing well per notes. Continues to have ARF from last admission, for renal US today.  Pharmacy started on TPN, Clinimix E 4.25/25, 1450 ml cycled over 12 hours (6p-6a): 50 ml/hr x 1 hour, 135 ml/hr x 10 hours, then 50 ml/hr x 1 hour. Lipids (20% IVFE @ 20 ml/hr over 12 hours), multivitamins, and trace elements are provided 3 times weekly (MWF) due to national backorder.  Provides 1694 kcal and 61 grams protein daily (based on weekly average).  Meets 106% minimum estimated kcal and 103% minimum estimated protein needs.  Pt on TPN at home x 7  years per note. Weight appears to be elevated from trend in weight hx, likely from increased fluids. Pt does eat per notes, but likely not absorbing r/t short bowel, only about 1 foot remaining.    NUTRITION DIAGNOSIS: -Altered GI function (NI-1.4).  Status: Ongoing  RELATED TO: short bowel syndrome  AS EVIDENCE BY:  Home TPN  MONITORING/EVALUATION(Goals): Goal: TPN to continue to provide >90% estimated nutrition needs Monitor: TPN, weight, labs  EDUCATION NEEDS: -No education needs identified at this time    DOCUMENTATION CODES Per approved criteria  -Not Applicable   Clarene Duke RD, LDN Pager [phone] After Hours pager [phone]

## 2011-12-26 NOTE — Progress Notes (Signed)
Pharmacist Heart Failure Core Measure Documentation  Assessment: [name] has an EF documented as 30 on 12/25/11 by Echo.  Rationale: Heart failure patients with left ventricular systolic dysfunction (LVSD) and an EF < 40% should be prescribed an angiotensin converting enzyme inhibitor (ACEI) or angiotensin receptor blocker (ARB) at discharge unless a contraindication is documented in the medical record.  This patient is not currently on an ACEI or ARB for HF.  This note is being placed in the record in order to provide documentation that a contraindication to the use of these agents is present for this encounter.  ACE Inhibitor or Angiotensin Receptor Blocker is contraindicated (specify all that apply)  []   ACEI allergy AND ARB allergy []   Angioedema []   Moderate or severe aortic stenosis []   Hyperkalemia []   Hypotension []   Renal artery stenosis [x]   Worsening renal function, preexisting renal disease or dysfunction   [name] [name], Pharm D 12/26/2011 11:37 AM

## 2011-12-26 NOTE — Progress Notes (Signed)
Discussed preliminary nuc results with patient at 7pm; will await cardiology rounding tomorrow to decide on next step. She is in good spirits and denies any current complaints. Dayna Dunn PA-C

## 2011-12-26 NOTE — Progress Notes (Signed)
PARENTERAL NUTRITION CONSULT NOTE - Follow Up  Pharmacy Consult for TNA Indication: Short Gut Syndrome  Allergies  Allergen Reactions  . Iohexol Itching    IVP Dye ok if taken with benadryl  . Penicillins Itching   Patient Measurements: Height: 5\' 8"  (172.7 cm) Weight: 161 lb 13.1 oz (73.4 kg) IBW/kg (Calculated) : 63.9  Adjusted Body Weight: n/a Usual Weight: 70.4 kg  Vital Signs: Temp: 98.4 F (36.9 C) (08/16 0749) Temp src: Oral (08/16 0749) BP: 109/74 mmHg (08/16 0749) Pulse Rate: 73  (08/16 0749) Intake/Output from previous day: 08/15 0701 - 08/16 0700 In: 1190 [P.O.:480; I.V.:10; IV Piggyback:100; TPN:600] Out: 4300 [Urine:4300] Intake/Output from this shift: Total I/O In: 50 [TPN:50] Out: 150 [Urine:150]  Labs:  [facility] 12/26/11 0520 12/25/11 0422  WBC 4.4 7.0  HGB 8.5* 10.0*  HCT 25.5* 30.0*  PLT 124* 146*  APTT -- --  INR -- 4.00*    Basename 12/26/11 0520 12/25/11 1240 12/25/11 0422  NA 133* -- 132*  K 3.4* -- 3.5  CL 97 -- 95*  CO2 27 -- 28  GLUCOSE 126* -- 196*  BUN 33* -- 33*  CREATININE 2.53* -- 2.50*  LABCREA -- -- --  CREAT24HRUR -- -- --  CALCIUM 8.3* -- 8.8  MG -- 2.4 --  PHOS 4.8* 4.6 --  PROT -- 8.4* --  ALBUMIN 2.4* 2.5* --  AST -- 32 --  ALT -- 15 --  ALKPHOS -- 134* --  BILITOT -- 0.5 --  BILIDIR -- 0.2 --  IBILI -- 0.3 --  PREALBUMIN -- 13.3* --  TRIG -- 124 --  CHOLHDL -- -- --  CHOL -- 92 --   Estimated Creatinine Clearance: 23.6 ml/min (by C-G formula based on Cr of 2.53).   Basename 12/26/11 0537 12/25/11 2342 12/25/11 1726  GLUCAP 138* 108* 76   Medical History: Past Medical History  Diagnosis Date  . Short bowel syndrome   . Abnormal LFTs   . Vitamin d deficiency   . Personal history of colonic polyps   . Thrombophilia   . Diabetes mellitus     resolved after weight loss  . Obesity     prior to weight loss  . Allergic rhinosinusitis   . At risk for dental problems   . Fracture of left clavicle   .  Osteoporosis   . Anemia of chronic disease   . Renal insufficiency   . Atypical nevus   . Pathologic fracture of neck of femur   . Pancytopenia 10/07/2011  . Small bowel ischemia   . Superior mesenteric artery stenosis   . Bacterial overgrowth syndrome    Medications:  Scheduled:     . albuterol      . aspirin EC  81 mg Oral Daily  . calcium carbonate  3 tablet Oral TID WC  . carvedilol  3.125 mg Oral BID WC  . fluconazole  200 mg Intravenous Q24H  . furosemide  60 mg Intravenous BID  . insulin aspart  0-9 Units Subcutaneous Q6H  . multivitamin with minerals  1 tablet Oral Daily  . pantoprazole  80 mg Oral BID AC  . potassium chloride  10 mEq Oral Daily  . sodium chloride  3 mL Intravenous Q12H  . Vitamin D (Ergocalciferol)  50,000 Units Oral Q7 days   Insulin Requirements in the past 24 hours:  TPN started yesterday - Sensitive SSI currently ordered and she has not required any insulin yet.  Nutritional Goals: Home TNA provides ~  1700 Kcal and 61 grams of protein daily.   She receives Lipids three times/week as well and also eats oral foods (doubtful absorption).  Awaiting input from RD for nutritional goals.  Assessment: 61 y.o F recently hospitalized at [facility] from 7/29 - 8/7  for fungemia (new PICC line placed 8/6) and acute renal failure and readmitted to [facility] on 8/15 for acute on chronic SHF exacerbation . 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 PTA, the patient was on a cyclic TNA infused over 10 hours each day which provided an average of 1682 kcal (224 kcal (61g) protein and 1224 kcal (360g) CHO daily with 500 kcal (50g) lipids 3x/week, zinc/selenium 3x/week). The patient also is able to eat in addition to the TNA -- however she likely has minimal absorption from this. The patient understands she will be using a [facility] TNA and will continue  her cycling schedule.  She is receiving Warfarin therapy PTA with INR yesterday at 4.0.  She also has some mild thrombocytopenia.  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 On chronic TNA. On heart health diet, PPI po Endo: No hx DM. CBG 138 on CBG. Lytes: Na 133, K 3.4, Cl~97. On KCl 10 mEq daily, calcium carbonate tidwc, multivitamins po, vitamin D Renal: Hx recent ARI with SCr similar to what it was when discharged from [facility] earlier this month. Previous known baseline around 0.8-1.1. SCr 2.5, CrCl~20-25 ml/min.  Pulm: RA Cards: Admitted with acute on chronic SHF exacerbation. BNP [id number]. BP/HR wnl. On ASA, coreg, lasix.  No ACE due to increased SCreat. Hepatobil: LFTs/TBili wnl, alk phos 134, TG wnl.  Prealbumin low at 13.3 despite TNA. Neuro: A&O, pain score 0 ID: Hx recent fungemia (candida parapsilosis and famata). Per ID during recent admit at [facility] -- to continue IV fluconazole for a total of 2 weeks (8/13 -8/16) and to get outpatient eye exam to r/o endophthalmitis prior to stopping the fluconazole. Pt currently asymptomatic of fungemia. New PICC line placed 8/6 Afebrile, WBc WNL, continues on IV fluconazole Best Practices: PPI po/Warfarin  Plan:  1. Start Clinimix E 4.25/25, 1450 ml cycled over 12 hours (6p-6a): 50 ml/hr x 1 hour, 135 ml/hr x 10 hours, then 50 ml/hr x 1 hour 2. Provide trace elements, lipids, and MVI on MWF only 2/2 national shortage 3. Will follow-up RD assessment for any changes in nutrition goals 4. F/u TNA labs  Nadara Mustard, PharmD., MS Clinical Pharmacist Pager:  [phone]  Thank you for allowing pharmacy to be part of this patients care team. 12/26/2011 9:01 AM

## 2011-12-26 NOTE — Progress Notes (Signed)
[facility] PROGRESS NOTE  [name] [name] ZOX:[id number] DOB: February 27, 1951 DOA: 12/25/2011 PCP: [name], MD  Assessment/Plan:   *Pulmonary edema/ Acute on chronic systolic CHF - EF 30% with moderate LVH and wall motion abnormalities Cont to diurese while carefully monitoring renal function- cardiology following - Lexiscan completed today- if positive, would need to medically manage for now due to renal failure  no longer requiring O2- will transfer to telemetry-   Renal failure Relatively acute as renal function was normal in 1/13- Cr climbed slightly in 5/13 to 1.13 and then to 2.5 in 8/13 It was suspected to be related to fungemia and dehydration when admitted in July- she was to follow up but has not yet had the chance further work up has been initialed by Renal service- appreciate their input.   Fungemia- Candidemia She is due to stop Fluconozole today- per ID note from 8/5- Dr Luciana Axe- she has not had a fundoscopy exam- Have called and left message for Opthalmology (Dr Rankin's office) for a consult   Acquired short bowel syndrome Cont TNA  Thrombophilia Cont Coumadin- pharmacy to manage  Chronic Thrombocytopenia Appears to be mild and at baseline- Plt were lower on last admission possibly due to sepsis   Code Status: full code Family Communication: discussed with patient today Disposition Plan: transfer to telemetry DVT prophylaxis- on Coumadin- INR is subtheraputic- add SCDs   Brief narrative: 61 year old woman with history of short bowel syndrome TPN dependent, who was recently hospitalized for fungemia and acute renal failure presented to the emergency room last night complaining of progressive shortness of breath and coughing. She was vigorously hydrated during the prior admission. Patient denies any chest pain and she does not have any cardiac history she knows of. In the emergency room she was found to be in pulmonary edema referred for  admission  Consultants:  [facility]  [facility]  HPI/Subjective: She continues to have a cough today which continues to be dry or productive of only mild clear mucous- dyspnea is improved but not resolved- no chest pain   Objective: Filed Vitals:   12/26/11 1302 12/26/11 1304 12/26/11 1306 12/26/11 1407  BP: 109/68 107/67 100/64 101/67  Pulse:      Temp:      TempSrc:      Resp:    16  Height:      Weight:      SpO2:    98%    Intake/Output Summary (Last 24 hours) at 12/26/11 1509 Last data filed at 12/26/11 0829  Gross per 24 hour  Intake   1090 ml  Output   2700 ml  Net  -1610 ml    Exam:   General:  No distress - alert  Cardiovascular: RRR, no murmur  Respiratory: b/l crackles at bases  Abdomen: soft, NT, ND, BS+  Ext:no c/c/e  Data Reviewed: Basic Metabolic Panel:  Lab 12/26/11 8119 12/25/11 1240 12/25/11 0422  NA 133* -- 132*  K 3.4* -- 3.5  CL 97 -- 95*  CO2 27 -- 28  GLUCOSE 126* -- 196*  BUN 33* -- 33*  CREATININE 2.53* -- 2.50*  CALCIUM 8.3* -- 8.8  MG -- 2.4 --  PHOS 4.8* 4.6 --   Liver Function Tests:  Lab 12/26/11 0520 12/25/11 1240  AST -- 32  ALT -- 15  ALKPHOS -- 134*  BILITOT -- 0.5  PROT -- 8.4*  ALBUMIN 2.4* 2.5*   No results found for this basename: LIPASE:5,AMYLASE:5 in the last 168  hours No results found for this basename: AMMONIA:5 in the last 168 hours CBC:  Lab 12/26/11 0520 12/25/11 0422  WBC 4.4 7.0  NEUTROABS 2.1 4.0  HGB 8.5* 10.0*  HCT 25.5* 30.0*  MCV 86.1 86.7  PLT 124* 146*   Cardiac Enzymes:  Lab 12/26/11 0520 12/25/11 1640 12/25/11 1240 12/25/11 0422  CKTOTAL 39 46 47 --  CKMB 3.1 3.8 4.3* --  CKMBINDEX -- -- -- --  TROPONINI <0.30 0.59* 0.72* 0.61*   BNP (last 3 results)  Basename 12/25/11 0422  PROBNP 23321.0*   CBG:  Lab 12/26/11 0537 12/25/11 2342 12/25/11 1726 12/25/11 1228  GLUCAP 138* 108* 76 89    Recent Results (from the past 240 hour(s))  MRSA PCR SCREENING      Status: Normal   Collection Time   12/25/11  7:08 AM      Component Value Range Status Comment   MRSA by PCR NEGATIVE  NEGATIVE Final      Studies: Dg Chest 2 View  12/25/2011  *RADIOLOGY REPORT*  Clinical Data: Short of breath.  CHEST - 2 VIEW  Comparison: 12/09/2010.  Findings: Right upper extremity PICC is present.  Moderate CHF is present with right greater than left bilateral basilar pleural effusions, atelectasis and interstitial and alveolar edema.  Aortic arch atherosclerosis is present.  IMPRESSION: Moderate CHF/volume overload.  Original Report Authenticated By: Andreas Newport, M.D.   Dg Chest 2 View  12/09/2011  *RADIOLOGY REPORT*  Clinical Data: Fever and weakness.  Ex-smoker.  CHEST - 2 VIEW  Comparison: CT of 10/24/2011 and plain film of 01/17/2011.  Findings: A left-sided PICC line which is unchanged and terminates at the low SVC. Midline trachea.  Borderline cardiomegaly.  Age advanced aortic atherosclerosis. No pleural effusion or pneumothorax.  The reticular nodular opacities described on the prior plain film are improved to resolved.  Mild interstitial prominence remains, and is likely related to the clinical history of  prior smoking. No lobar consolidation.  IMPRESSION: Interstitial thickening, likely related to prior smoking / chronic bronchitis.  No evidence of acute superimposed pneumonia.  Original Report Authenticated By: Consuello Bossier, M.D.   US Renal  12/09/2011  *RADIOLOGY REPORT*  Clinical Data: Hematuria.  History of diabetes.  RENAL/URINARY TRACT ULTRASOUND COMPLETE  Comparison:  Abdominal ultrasound 10/29/2011.  Findings:  Right Kidney:  Mild diffuse increased echogenicity of the renal parenchyma is nonspecific.  No focal cystic or solid renal lesions. 12.9 cm in length.  No hydronephrosis. No definite calculi.  Left Kidney:  Mild diffuse increased echogenicity of the renal parenchyma is nonspecific.  No focal cystic or solid renal lesions. 13.6 cm in length.  No  hydronephrosis. No definite calculi.  Bladder:  Well distended without focal wall abnormalities.  IMPRESSION: 1.  Mild diffusely increased echogenicity of the the renal parenchyma bilaterally is nonspecific, but could suggest underlying medical renal disease. 2.  No focal cystic or solid renal lesions, and no definite evidence of calculi within the collecting systems.  Original Report Authenticated By: Florencia Reasons, M.D.   Ir Fluoro Guide Cv Line Right  12/17/2011  *RADIOLOGY REPORT*  Clinical Data: Total parenteral nutrition requirements. Malnutrition.  PICC LINE PLACEMENT WITH ULTRASOUND AND FLUOROSCOPIC  GUIDANCE  Fluoroscopy Time: 1.1 minutes.  The right arm was prepped with chlorhexidine, draped in the usual sterile fashion using maximum barrier technique (cap and mask, sterile gown, sterile gloves, large sterile sheet, hand hygiene and cutaneous antisepsis) and infiltrated locally with 1% Lidocaine.  Ultrasound demonstrated  patency of the right basilic vein, and this was documented with an image.  Under real-time ultrasound guidance, this vein was accessed with a 21 gauge micropuncture needle and image documentation was performed.  The needle was exchanged over a guidewire for a peel-away sheath through which a five [place] double lumen PICC trimmed to 33 cm was advanced, positioned with its tip at the lower SVC/right atrial junction.  Fluoroscopy during the procedure and fluoro spot radiograph confirms appropriate catheter position.  The catheter was flushed, secured to the skin with Prolene sutures, and covered with a sterile dressing.  Complications:  None.  IMPRESSION: Successful right arm PICC line placement with ultrasound and fluoroscopic guidance.  The catheter is ready for use.  Original Report Authenticated By: Donavan Burnet, M.D.   Ir US Guide Vasc Access Right  12/17/2011  *RADIOLOGY REPORT*  Clinical Data: Total parenteral nutrition requirements. Malnutrition.  PICC LINE PLACEMENT WITH  ULTRASOUND AND FLUOROSCOPIC  GUIDANCE  Fluoroscopy Time: 1.1 minutes.  The right arm was prepped with chlorhexidine, draped in the usual sterile fashion using maximum barrier technique (cap and mask, sterile gown, sterile gloves, large sterile sheet, hand hygiene and cutaneous antisepsis) and infiltrated locally with 1% Lidocaine.  Ultrasound demonstrated patency of the right basilic vein, and this was documented with an image.  Under real-time ultrasound guidance, this vein was accessed with a 21 gauge micropuncture needle and image documentation was performed.  The needle was exchanged over a guidewire for a peel-away sheath through which a five [place] double lumen PICC trimmed to 33 cm was advanced, positioned with its tip at the lower SVC/right atrial junction.  Fluoroscopy during the procedure and fluoro spot radiograph confirms appropriate catheter position.  The catheter was flushed, secured to the skin with Prolene sutures, and covered with a sterile dressing.  Complications:  None.  IMPRESSION: Successful right arm PICC line placement with ultrasound and fluoroscopic guidance.  The catheter is ready for use.  Original Report Authenticated By: Donavan Burnet, M.D.    Scheduled Meds:   . albuterol      . aspirin EC  81 mg Oral Daily  . calcium carbonate  3 tablet Oral TID WC  . carvedilol  3.125 mg Oral BID WC  . furosemide  60 mg Intravenous BID  . insulin aspart  0-9 Units Subcutaneous Q6H  . multivitamin with minerals  1 tablet Oral Daily  . pantoprazole  80 mg Oral BID AC  . potassium chloride  10 mEq Oral Daily  . potassium chloride  40 mEq Oral BID  . regadenoson      . regadenoson  0.4 mg Intravenous Once  . sodium chloride  3 mL Intravenous Q12H  . Vitamin D (Ergocalciferol)  50,000 Units Oral Q7 days  . DISCONTD: fluconazole  200 mg Intravenous Q24H   Continuous Infusions:   . fat emulsion    . TPN (CLINIMIX) +/- additives Stopped (12/26/11 0800)  . TPN (CLINIMIX) +/-  additives      ________________________________________________________________________  Time spent: 45 min   [facility]  [facility] Pager [phone] If 8PM-8AM, please contact night-coverage at [email or web address], password [facility] 12/26/2011, 3:09 PM  LOS: 1 day

## 2011-12-26 NOTE — Progress Notes (Addendum)
*  PRELIMINARY RESULTS* Vascular Ultrasound Renal Artery Duplex has been completed.  There is no evidence of significant stenosis of the right renal artery. Bilateral renal arteries exhibit borderline elevated resistive indices. 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 This is atypical considering the patient is NPO since yesterday evening, however the patient states she has taken medication including Tums immediately prior to the study. Uncertain how this will affect the accuracy of these results.  Right RAR=0.73, left RAR=0.67.  12/26/2011 9:26 AM Gertie Fey, RDMS, RDCS

## 2011-12-26 NOTE — Progress Notes (Signed)
ANTICOAGULATION CONSULT NOTE - Follow Up Consult  Pharmacy Consult for Coumadin Indication: history of SMA occlusion and thrombophilia  Allergies  Allergen Reactions  . Iohexol Itching    IVP Dye ok if taken with benadryl  . Penicillins Itching    Patient Measurements: Height: 5\' 8"  (172.7 cm) Weight: 161 lb 13.1 oz (73.4 kg) IBW/kg (Calculated) : 63.9   Vital Signs: Temp: 98.8 F (37.1 C) (08/16 1600) Temp src: Oral (08/16 1600) BP: 99/64 mmHg (08/16 1600) Pulse Rate: 82  (08/16 0800)  Labs:  Basename 12/26/11 1505 12/26/11 0520 12/25/11 1640 12/25/11 1240 12/25/11 0422  HGB -- 8.5* -- -- 10.0*  HCT -- 25.5* -- -- 30.0*  PLT -- 124* -- -- 146*  APTT -- -- -- -- --  LABPROT 20.9* -- -- -- 39.6*  INR 1.77* -- -- -- 4.00*  HEPARINUNFRC -- -- -- -- --  CREATININE -- 2.53* -- -- 2.50*  CKTOTAL -- 39 46 47 --  CKMB -- 3.1 3.8 4.3* --  TROPONINI -- <0.30 0.59* 0.72* --    Estimated Creatinine Clearance: 23.6 ml/min (by C-G formula based on Cr of 2.53).  Assessment:   INR 4.0 on admission 12/25/11, but down to 1.77 today;  subtherapeutic.  Recently on Fluconazole, which may have effected INR.  Last Fluconazole dose given this morning, and now discontinued.  No reversal agents given. Most recently on Coumadin 1 mg daily.  Goal of Therapy:  Heparin level 0.3-0.7 units/ml Monitor platelets by anticoagulation protocol: Yes   Plan:   Coumadin 2 mg PO tonight.  Continue daily PT/INR.  Dennie Fetters, [place] Pager: [phone] 12/26/2011,6:16 PM

## 2011-12-26 NOTE — Progress Notes (Signed)
Patient ID: [name] [name], female   DOB: 1951/03/11, 61 y.o.   MRN: [id number]    Subjective:  Denies SSCP, palpitations or Dyspnea NPO for renal US  Objective:  Filed Vitals:   12/25/11 2100 12/26/11 0000 12/26/11 0141 12/26/11 0600  BP: 111/71  104/68 117/77  Pulse: 75  68 76  Temp:  98.1 F (36.7 C)    TempSrc:  Oral    Resp:   18 20  Height:      Weight:      SpO2: 98%  99% 100%    Intake/Output from previous day:  Intake/Output Summary (Last 24 hours) at 12/26/11 0743 Last data filed at 12/26/11 0700  Gross per 24 hour  Intake   1190 ml  Output   4300 ml  Net  -3110 ml    Physical Exam: Affect appropriate Healthy:  appears stated age HEENT: normal Neck supple with no adenopathy JVP normal no bruits no thyromegaly Lungs clear with no wheezing and good diaphragmatic motion Heart:  S1/S2 no murmur, no rub, gallop or click PMI normal Abdomen: benighn, BS positve, no tenderness, S/P colon surgery no bruit.  No HSM or HJR Distal pulses intact with no bruits No edema Neuro non-focal Skin warm and dry No muscular weakness PIC line RUE  Lab Results: Basic Metabolic Panel:  Basename 12/26/11 0520 12/25/11 1240 12/25/11 0422  NA 133* -- 132*  K 3.4* -- 3.5  CL 97 -- 95*  CO2 27 -- 28  GLUCOSE 126* -- 196*  BUN 33* -- 33*  CREATININE 2.53* -- 2.50*  CALCIUM 8.3* -- 8.8  MG -- 2.4 --  PHOS 4.8* 4.6 --   Liver Function Tests:  Basename 12/26/11 0520 12/25/11 1240  AST -- 32  ALT -- 15  ALKPHOS -- 134*  BILITOT -- 0.5  PROT -- 8.4*  ALBUMIN 2.4* 2.5*   No results found for this basename: LIPASE:2,AMYLASE:2 in the last 72 hours CBC:  Basename 12/26/11 0520 12/25/11 0422  WBC 4.4 7.0  NEUTROABS 2.1 4.0  HGB 8.5* 10.0*  HCT 25.5* 30.0*  MCV 86.1 86.7  PLT 124* 146*   Cardiac Enzymes:  Basename 12/26/11 0520 12/25/11 1640 12/25/11 1240  CKTOTAL 39 46 47  CKMB 3.1 3.8 4.3*  CKMBINDEX -- -- --  TROPONINI <0.30 0.59* 0.72*   Fasting Lipid  Panel:  Basename 12/25/11 1240  CHOL 92  HDL --  LDLCALC --  TRIG 124  CHOLHDL --  LDLDIRECT --    Imaging: Dg Chest 2 View  12/25/2011  *RADIOLOGY REPORT*  Clinical Data: Short of breath.  CHEST - 2 VIEW  Comparison: 12/09/2010.  Findings: Right upper extremity PICC is present.  Moderate CHF is present with right greater than left bilateral basilar pleural effusions, atelectasis and interstitial and alveolar edema.  Aortic arch atherosclerosis is present.  IMPRESSION: Moderate CHF/volume overload.  Original Report Authenticated By: Andreas Newport, M.D.    Cardiac Studies:  ECG: 12/25/11  NSR rate 91 normal ECG poor R wave progression   Telemetry: 12/26/2011   NSR no VT   Echo: EF 30% range diffuse hypokinesis  Medications:     . albuterol      . aspirin EC  81 mg Oral Daily  . calcium carbonate  3 tablet Oral TID WC  . carvedilol  3.125 mg Oral BID WC  . fluconazole  200 mg Intravenous Q24H  . furosemide  60 mg Intravenous BID  . insulin aspart  0-9 Units Subcutaneous Q6H  .  multivitamin with minerals  1 tablet Oral Daily  . pantoprazole  80 mg Oral BID AC  . potassium chloride  10 mEq Oral Daily  . sodium chloride  3 mL Intravenous Q12H  . Vitamin D (Ergocalciferol)  50,000 Units Oral Q7 days  . DISCONTD: sodium chloride   Intravenous Once  . DISCONTD: calcium carbonate  2 tablet Oral TID       . TPN (CLINIMIX) +/- additives 50 mL/hr at 12/25/11 2000  . DISCONTD: ADULT TPN      Assessment/Plan:  CHF:  No acute coronary syndrome.  EF down more by echo compared to previous 45%.  Discussed with patient.  Cath would be risky for  Kidneys and would have to stop coumadin.  Lexiscan myovue today to assess for CAD.  No ACE due to elevated Cr continue beta blocker And diuretic.  Looks better today.  Change to PO diuretics in am CRF:  For renal US today plan per nephrology/primary service Pancytompenia:  Relative w/u per primary service    Charlton Haws 12/26/2011, 7:43  AM

## 2011-12-27 LAB — CBC
HCT: 26 % — ABNORMAL LOW (ref 36.0–46.0)
Hemoglobin: 8.4 g/dL — ABNORMAL LOW (ref 12.0–15.0)
MCHC: 32.3 g/dL (ref 30.0–36.0)
MCV: 86.4 fL (ref 78.0–100.0)

## 2011-12-27 LAB — GLUCOSE, CAPILLARY: Glucose-Capillary: 99 mg/dL (ref 70–99)

## 2011-12-27 LAB — COMPREHENSIVE METABOLIC PANEL
ALT: 25 U/L (ref 0–35)
Alkaline Phosphatase: 148 U/L — ABNORMAL HIGH (ref 39–117)
BUN: 40 mg/dL — ABNORMAL HIGH (ref 6–23)
CO2: 26 mEq/L (ref 19–32)
Calcium: 8.4 mg/dL (ref 8.4–10.5)
GFR calc Af Amer: 20 mL/min — ABNORMAL LOW (ref >90)
GFR calc non Af Amer: 18 mL/min — ABNORMAL LOW (ref >90)
Glucose, Bld: 118 mg/dL — ABNORMAL HIGH (ref 70–99)
Potassium: 4.1 mEq/L (ref 3.5–5.1)
Sodium: 132 mEq/L — ABNORMAL LOW (ref 135–145)

## 2011-12-27 LAB — PROTIME-INR: INR: 1.53 — ABNORMAL HIGH (ref 0.00–1.49)

## 2011-12-27 MED ORDER — FUROSEMIDE 20 MG PO TABS
20.0000 mg | ORAL_TABLET | Freq: Two times a day (BID) | ORAL | Status: DC
  Administered 2011-12-27 – 2011-12-28 (×4): 20 mg via ORAL
  Filled 2011-12-27 (×7): qty 1

## 2011-12-27 MED ORDER — ADULT MULTIVITAMIN W/MINERALS CH
1.0000 | ORAL_TABLET | Freq: Two times a day (BID) | ORAL | Status: DC
  Administered 2011-12-27 – 2011-12-28 (×2): 1 via ORAL
  Filled 2011-12-27 (×3): qty 1

## 2011-12-27 MED ORDER — CLINIMIX E/DEXTROSE (4.25/25) 4.25 % IV SOLN
INTRAVENOUS | Status: DC
  Administered 2011-12-27: 18:00:00 via INTRAVENOUS
  Filled 2011-12-27: qty 2000

## 2011-12-27 MED ORDER — ISOSORBIDE DINITRATE 10 MG PO TABS
10.0000 mg | ORAL_TABLET | Freq: Two times a day (BID) | ORAL | Status: DC
  Administered 2011-12-27 – 2011-12-28 (×3): 10 mg via ORAL
  Filled 2011-12-27 (×5): qty 1

## 2011-12-27 MED ORDER — WARFARIN SODIUM 4 MG PO TABS
4.0000 mg | ORAL_TABLET | Freq: Once | ORAL | Status: AC
  Administered 2011-12-27: 4 mg via ORAL
  Filled 2011-12-27: qty 1

## 2011-12-27 NOTE — Progress Notes (Signed)
[facility]  Subjective:  Awake, alert, was in nuclear med for several hours today.  Feels fine   Objective: Vital signs in last 24 hours: Blood pressure 108/67, pulse 78, temperature 98.1 F (36.7 C), temperature source Oral, resp. rate 18, height 5\' 8"  (1.727 m), weight 69.627 kg (153 lb 8 oz), SpO2 98.00%.    PHYSICAL EXAM General--as above Chest--clear Heart--no rub Abd--nontender Extr--no edema, PICC R antecub  Lab Results:   Lab 12/27/11 0538 12/26/11 0520 12/25/11 1240 12/25/11 0422  NA 132* 133* -- 132*  K 4.1 3.4* -- 3.5  CL 97 97 -- 95*  CO2 26 27 -- 28  BUN 40* 33* -- 33*  CREATININE 2.74* 2.53* -- 2.50*  ALB -- -- -- --  GLUCOSE 118* -- -- --  CALCIUM 8.4 8.3* -- 8.8  PHOS 5.4* 4.8* 4.6 --     Basename 12/27/11 0538 12/26/11 0520  WBC 4.9 4.4  HGB 8.4* 8.5*  HCT 26.0* 25.5*  PLT 138* 124*    Assessment: 1. Recent acute renal failure - Cr was 1.13 on 28 May and has been > 2.2 since 29 July.   ANA+ 1:160, complements normal, ANCA neg and SPEP/UPEP are pending. Patient anticipating discharge tomorrow. She has f/u appt early Sept with Dr. Kathrene Bongo.  D/W Dr. Kathrene Bongo and with patient- the plan is to keep appt early Sept, if no improvement possible biopsy.  2.   Anemia and thrombocytopenia: .  -will repeat anemia panel (Fe/TIBC/ferritin pending).  Will rx IV Fe if Fe stores low; aranesp if Fe stores adequate 3.   Exacerbation of chronic CHF with pulmonary edema: resolved clinically, lasix decreased 20 iv bid 4.   Recent candidemia - patient is currently without signs and symptoms of ongoing fungemia. On Diflucan IV per primary team. PICC line R antecub 5.  Short bowel syndrome - On TNA, per primary team.  Rec: See above, will sign off. Please call as needed.     LOS: 2 days   Tinnie Kunin D 12/27/2011,4:29 PM

## 2011-12-27 NOTE — Progress Notes (Signed)
ANTICOAGULATION CONSULT NOTE - Follow Up Consult  Pharmacy Consult for Coumadin Indication: history of SMA occlusion and thrombophilia  Allergies  Allergen Reactions  . Iohexol Itching    IVP Dye ok if taken with benadryl  . Penicillins Itching   Vital Signs: Temp: 98.7 F (37.1 C) (08/17 0603) Temp src: Oral (08/17 0603) BP: 104/63 mmHg (08/17 0816) Pulse Rate: 78  (08/17 0816)  Labs:  Basename 12/27/11 0538 12/26/11 1505 12/26/11 0520 12/25/11 1640 12/25/11 1240 12/25/11 0422  HGB 8.4* -- 8.5* -- -- --  HCT 26.0* -- 25.5* -- -- 30.0*  PLT 138* -- 124* -- -- 146*  APTT -- -- -- -- -- --  LABPROT 18.7* 20.9* -- -- -- 39.6*  INR 1.53* 1.77* -- -- -- 4.00*  HEPARINUNFRC -- -- -- -- -- --  CREATININE 2.74* -- 2.53* -- -- 2.50*  CKTOTAL -- -- 39 46 47 --  CKMB -- -- 3.1 3.8 4.3* --  TROPONINI -- -- <0.30 0.59* 0.72* --    Estimated Creatinine Clearance: 21.8 ml/min (by C-G formula based on Cr of 2.74).  Assessment: 60yof on coumadin pta, admitted with INR of 4. Recently on a 2 week course of fluconazole with last dose given 8/16 - likely reason for elevated INR. Patient reports most recently being on coumadin 1mg  daily. INR today is subtherapeutic and decreased from yesterday despite 2mg  being given. Will probably need higher doses now that fluconazole discontinued. CBC low but stable. No bleeding noted.  Goal of Therapy:  INR 2-3 Monitor platelets by anticoagulation protocol: Yes   Plan:  1) Increase coumadin to 4mg  x 1 2) Follow up INR in AM  Fredrik Rigger 12/27/2011,11:28 AM

## 2011-12-27 NOTE — Progress Notes (Addendum)
PARENTERAL NUTRITION CONSULT NOTE - Follow Up  Pharmacy Consult for TNA Indication: Short Gut Syndrome  Allergies  Allergen Reactions  . Iohexol Itching    IVP Dye ok if taken with benadryl  . Penicillins Itching   Patient Measurements: Height: 5\' 8"  (172.7 cm) Weight: 153 lb 8 oz (69.627 kg) (b scale) IBW/kg (Calculated) : 63.9  Adjusted Body Weight: n/a Usual Weight: 70.4 kg  Vital Signs: Temp: 98.7 F (37.1 C) (08/17 0603) Temp src: Oral (08/17 0603) BP: 107/70 mmHg (08/17 0603) Pulse Rate: 72  (08/17 0603) Intake/Output from previous day: 08/16 0701 - 08/17 0700 In: 570 [P.O.:410; IV Piggyback:110; TPN:50] Out: 1450 [Urine:1450]  Labs:  [facility] 12/27/11 0538 12/26/11 1505 12/26/11 0520 12/25/11 0422  WBC 4.9 -- 4.4 7.0  HGB 8.4* -- 8.5* 10.0*  HCT 26.0* -- 25.5* 30.0*  PLT 138* -- 124* 146*  APTT -- -- -- --  INR 1.53* 1.77* -- 4.00*    Basename 12/26/11 0520 12/25/11 1240 12/25/11 0422  NA 133* -- 132*  K 3.4* -- 3.5  CL 97 -- 95*  CO2 27 -- 28  GLUCOSE 126* -- 196*  BUN 33* -- 33*  CREATININE 2.53* -- 2.50*  LABCREA -- -- --  CREAT24HRUR -- -- --  CALCIUM 8.3* -- 8.8  MG -- 2.4 --  PHOS 4.8* 4.6 --  PROT -- 8.4* --  ALBUMIN 2.4* 2.5* --  AST -- 32 --  ALT -- 15 --  ALKPHOS -- 134* --  BILITOT -- 0.5 --  BILIDIR -- 0.2 --  IBILI -- 0.3 --  PREALBUMIN -- 13.3* --  TRIG -- 124 --  CHOLHDL -- -- --  CHOL -- 92 --   Estimated Creatinine Clearance: 23.6 ml/min (by C-G formula based on Cr of 2.53).   Basename 12/26/11 2315 12/26/11 1706 12/26/11 0537  GLUCAP 148* 73 138*   Medical History: Past Medical History  Diagnosis Date  . Short bowel syndrome   . Abnormal LFTs   . Vitamin d deficiency   . Personal history of colonic polyps   . Thrombophilia   . Diabetes mellitus     resolved after weight loss  . Obesity     prior to weight loss  . Allergic rhinosinusitis   . At risk for dental problems   . Fracture of left clavicle   .  Osteoporosis   . Anemia of chronic disease   . Renal insufficiency   . Atypical nevus   . Pathologic fracture of neck of femur   . Pancytopenia 10/07/2011  . Small bowel ischemia   . Superior mesenteric artery stenosis   . Bacterial overgrowth syndrome    Medications:  Scheduled:     . aspirin EC  81 mg Oral Daily  . calcium carbonate  3 tablet Oral TID WC  . carvedilol  3.125 mg Oral BID WC  . furosemide  60 mg Intravenous BID  . insulin aspart  0-9 Units Subcutaneous Q6H  . multivitamin with minerals  1 tablet Oral Daily  . pantoprazole  80 mg Oral BID AC  . potassium chloride  10 mEq Oral Daily  . potassium chloride  40 mEq Oral BID  . regadenoson      . regadenoson  0.4 mg Intravenous Once  . sodium chloride  3 mL Intravenous Q12H  . Vitamin D (Ergocalciferol)  50,000 Units Oral Q7 days  . warfarin  2 mg Oral ONCE-1800  . Warfarin - Pharmacist Dosing Inpatient   Does  not apply q1800  . DISCONTD: fluconazole  200 mg Intravenous Q24H   Insulin Requirements in the past 24 hours:  TPN started yesterday - CBG 73-148 -Sensitive SSI currently ordered and she has not required any insulin yet.  Nutritional Goals: Home TNA provides ~ 1700 Kcal and 61 grams of protein daily.   She receives Lipids three times/week as well and also eats oral foods (doubtful absorption).  RD input for nutritional goals noted and appreciated.  Assessment: 61 y.o F recently hospitalized at [facility] from 7/29 - 8/7  for fungemia (new PICC line placed 8/6) and acute renal failure and readmitted to [facility] on 8/15 for acute on chronic SHF exacerbation . 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 PTA, the patient was on a cyclic TNA infused over 10 hours each day which provided an average of 1682 kcal (224 kcal (61g) protein and 1224 kcal (360g) CHO daily with 500 kcal (50g) lipids 3x/week,  zinc/selenium 3x/week). The patient also is able to eat in addition to the TNA -- however she likely has minimal absorption from this. The patient understands she will be using a Wallace TNA and will continue her cycling schedule.  She is receiving Warfarin therapy PTA with INR yesterday at 4.0.  She also has some mild thrombocytopenia.  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 On chronic TNA. On heart health diet, PPI po Endo: No hx DM. CBG 138 on CBG. 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  Pulm: RA Cards: Admitted with acute on chronic SHF exacerbation. BNP [id number]. BP/HR wnl. On ASA, coreg, lasix.  No ACE due to increased SCreat. Hepatobil: LFTs/TBili wnl, alk phos 134, TG wnl.  Prealbumin low at 13.3 despite TNA.  Repeat labs on Monday. Neuro: A&O, pain score 0 ID: Hx recent fungemia (candida parapsilosis and famata). Per ID during recent admit at [facility] -- to continue IV fluconazole for a total of 2 weeks (8/13 -8/16) and to get outpatient eye exam to r/o endophthalmitis prior to stopping the fluconazole. Pt currently asymptomatic of fungemia. New PICC line placed 8/6 Afebrile, WBc WNL, continues on IV fluconazole Best Practices: PPI po/Warfarin  Plan:  1. Continue Clinimix E 4.25/25, 1450 ml cycled over 12 hours (6p-6a): 50 ml/hr x 1 hour, 135 ml/hr x 10 hours, then 50 ml/hr x 1 hour 2. Provide trace elements, lipids, and MVI on MWF only 2/2 national shortage 3.  Will increase MVI supplementation to ensure adequate absorption and continue to provide MVI in TNA on MWF only. 4. F/u TNA labs  Nadara Mustard, PharmD., MS Clinical Pharmacist Pager:  [phone]  Thank you for allowing pharmacy to be part of this patients care team. 12/27/2011 7:46 AM

## 2011-12-27 NOTE — Progress Notes (Signed)
[facility] PROGRESS NOTE  [name] [name] AOZ:[id number] DOB: Aug 07, 1950 DOA: 12/25/2011 PCP: [name], MD  Assessment/Plan:   *Pulmonary edema/ Acute on chronic systolic CHF - EF 30% with moderate LVH and wall motion abnormalities Cont to diurese while carefully monitoring renal function- cardiology following - Lexiscan completed - prior scar noted- cath postponed due to ARF - no longer requiring O2 at rest but becoming hypoxic with exertion- agree with further diuresis  - f/u with Cardiology in 4 wks.  Renal failure Relatively acute as renal function was normal in 1/13- Cr climbed slightly in 5/13 to 1.13 and then to 2.5 in 8/13 It was suspected to be related to fungemia and dehydration when admitted in July- she was to follow up but has not yet had the chance further work up has been initialed by Renal service- appreciate their input- will likely need biopsy    Fungemia- Candidemia She was due to stop Fluconazole on 8/16 per ID note from 8/5- Dr Comer-It was discontinued appropriately - she has not had a fundoscopy exam- Have called and left message for Opthalmology (Dr Rankin's office) for a consult   Acquired short bowel syndrome Cont TNA  Thrombophilia Cont Coumadin- pharmacy to manage  Chronic Thrombocytopenia Appears to be mild and at baseline- Plt were lower on last admission possibly due to sepsis   Code Status: full code Family Communication: discussed with patient today Disposition Plan: transfer to telemetry DVT prophylaxis- on Coumadin- INR is subtheraputic- add SCDs   Brief narrative: 61 year old woman with history of short bowel syndrome TPN dependent, who was recently hospitalized for fungemia and acute renal failure presented to the emergency room last night complaining of progressive shortness of breath and coughing. She was vigorously hydrated during the prior admission. Patient denies any chest pain and she does not have any cardiac history she  knows of. In the emergency room she was found to be in pulmonary edema referred for admission  Consultants:  [facility]  [facility]  HPI/Subjective: Feeling quite better today but not back to baseline as far a breathing is concerned.    Objective: Filed Vitals:   12/27/11 0205 12/27/11 0603 12/27/11 0816 12/27/11 1351  BP: 101/67 107/70 104/63 108/67  Pulse: 67 72 78 78  Temp: 98.3 F (36.8 C) 98.7 F (37.1 C)  98.1 F (36.7 C)  TempSrc: Oral Oral  Oral  Resp: 16 18  18   Height:      Weight:  69.627 kg (153 lb 8 oz)    SpO2: 97% 100%  98%    Intake/Output Summary (Last 24 hours) at 12/27/11 1434 Last data filed at 12/27/11 1352  Gross per 24 hour  Intake    780 ml  Output   2150 ml  Net  -1370 ml    Exam:   General:  No distress - alert  Cardiovascular: RRR, no murmur  Respiratory: b/l crackles at bases- not as coarse as yesterday.   Abdomen: soft, NT, ND, BS+  Ext:no c/c/e  Data Reviewed: Basic Metabolic Panel:  Lab 12/27/11 9629 12/26/11 0520 12/25/11 1240 12/25/11 0422  NA 132* 133* -- 132*  K 4.1 3.4* -- 3.5  CL 97 97 -- 95*  CO2 26 27 -- 28  GLUCOSE 118* 126* -- 196*  BUN 40* 33* -- 33*  CREATININE 2.74* 2.53* -- 2.50*  CALCIUM 8.4 8.3* -- 8.8  MG 2.0 -- 2.4 --  PHOS 5.4* 4.8* 4.6 --   Liver Function Tests:  Lab 12/27/11  0865 12/26/11 0520 12/25/11 1240  AST 40* -- 32  ALT 25 -- 15  ALKPHOS 148* -- 134*  BILITOT 0.4 -- 0.5  PROT 8.1 -- 8.4*  ALBUMIN 2.4* 2.4* 2.5*   No results found for this basename: LIPASE:5,AMYLASE:5 in the last 168 hours No results found for this basename: AMMONIA:5 in the last 168 hours CBC:  Lab 12/27/11 0538 12/26/11 0520 12/25/11 0422  WBC 4.9 4.4 7.0  NEUTROABS -- 2.1 4.0  HGB 8.4* 8.5* 10.0*  HCT 26.0* 25.5* 30.0*  MCV 86.4 86.1 86.7  PLT 138* 124* 146*   Cardiac Enzymes:  Lab 12/26/11 0520 12/25/11 1640 12/25/11 1240 12/25/11 0422  CKTOTAL 39 46 47 --  CKMB 3.1 3.8 4.3* --    CKMBINDEX -- -- -- --  TROPONINI <0.30 0.59* 0.72* 0.61*   BNP (last 3 results)  Basename 12/25/11 0422  PROBNP 23321.0*   CBG:  Lab 12/26/11 2315 12/26/11 1706 12/26/11 0537 12/25/11 2342 12/25/11 1726  GLUCAP 148* 73 138* 108* 76    Recent Results (from the past 240 hour(s))  MRSA PCR SCREENING     Status: Normal   Collection Time   12/25/11  7:08 AM      Component Value Range Status Comment   MRSA by PCR NEGATIVE  NEGATIVE Final      Studies: Dg Chest 2 View  12/25/2011  *RADIOLOGY REPORT*  Clinical Data: Short of breath.  CHEST - 2 VIEW  Comparison: 12/09/2010.  Findings: Right upper extremity PICC is present.  Moderate CHF is present with right greater than left bilateral basilar pleural effusions, atelectasis and interstitial and alveolar edema.  Aortic arch atherosclerosis is present.  IMPRESSION: Moderate CHF/volume overload.  Original Report Authenticated By: Andreas Newport, M.D.   Dg Chest 2 View  12/09/2011  *RADIOLOGY REPORT*  Clinical Data: Fever and weakness.  Ex-smoker.  CHEST - 2 VIEW  Comparison: CT of 10/24/2011 and plain film of 01/17/2011.  Findings: A left-sided PICC line which is unchanged and terminates at the low SVC. Midline trachea.  Borderline cardiomegaly.  Age advanced aortic atherosclerosis. No pleural effusion or pneumothorax.  The reticular nodular opacities described on the prior plain film are improved to resolved.  Mild interstitial prominence remains, and is likely related to the clinical history of  prior smoking. No lobar consolidation.  IMPRESSION: Interstitial thickening, likely related to prior smoking / chronic bronchitis.  No evidence of acute superimposed pneumonia.  Original Report Authenticated By: Consuello Bossier, M.D.   US Renal  12/09/2011  *RADIOLOGY REPORT*  Clinical Data: Hematuria.  History of diabetes.  RENAL/URINARY TRACT ULTRASOUND COMPLETE  Comparison:  Abdominal ultrasound 10/29/2011.  Findings:  Right Kidney:  Mild diffuse  increased echogenicity of the renal parenchyma is nonspecific.  No focal cystic or solid renal lesions. 12.9 cm in length.  No hydronephrosis. No definite calculi.  Left Kidney:  Mild diffuse increased echogenicity of the renal parenchyma is nonspecific.  No focal cystic or solid renal lesions. 13.6 cm in length.  No hydronephrosis. No definite calculi.  Bladder:  Well distended without focal wall abnormalities.  IMPRESSION: 1.  Mild diffusely increased echogenicity of the the renal parenchyma bilaterally is nonspecific, but could suggest underlying medical renal disease. 2.  No focal cystic or solid renal lesions, and no definite evidence of calculi within the collecting systems.  Original Report Authenticated By: Florencia Reasons, M.D.   Ir Fluoro Guide Cv Line Right  12/17/2011  *RADIOLOGY REPORT*  Clinical Data: Total parenteral  nutrition requirements. Malnutrition.  PICC LINE PLACEMENT WITH ULTRASOUND AND FLUOROSCOPIC  GUIDANCE  Fluoroscopy Time: 1.1 minutes.  The right arm was prepped with chlorhexidine, draped in the usual sterile fashion using maximum barrier technique (cap and mask, sterile gown, sterile gloves, large sterile sheet, hand hygiene and cutaneous antisepsis) and infiltrated locally with 1% Lidocaine.  Ultrasound demonstrated patency of the right basilic vein, and this was documented with an image.  Under real-time ultrasound guidance, this vein was accessed with a 21 gauge micropuncture needle and image documentation was performed.  The needle was exchanged over a guidewire for a peel-away sheath through which a five [place] double lumen PICC trimmed to 33 cm was advanced, positioned with its tip at the lower SVC/right atrial junction.  Fluoroscopy during the procedure and fluoro spot radiograph confirms appropriate catheter position.  The catheter was flushed, secured to the skin with Prolene sutures, and covered with a sterile dressing.  Complications:  None.  IMPRESSION: Successful right  arm PICC line placement with ultrasound and fluoroscopic guidance.  The catheter is ready for use.  Original Report Authenticated By: Donavan Burnet, M.D.   Ir US Guide Vasc Access Right  12/17/2011  *RADIOLOGY REPORT*  Clinical Data: Total parenteral nutrition requirements. Malnutrition.  PICC LINE PLACEMENT WITH ULTRASOUND AND FLUOROSCOPIC  GUIDANCE  Fluoroscopy Time: 1.1 minutes.  The right arm was prepped with chlorhexidine, draped in the usual sterile fashion using maximum barrier technique (cap and mask, sterile gown, sterile gloves, large sterile sheet, hand hygiene and cutaneous antisepsis) and infiltrated locally with 1% Lidocaine.  Ultrasound demonstrated patency of the right basilic vein, and this was documented with an image.  Under real-time ultrasound guidance, this vein was accessed with a 21 gauge micropuncture needle and image documentation was performed.  The needle was exchanged over a guidewire for a peel-away sheath through which a five [place] double lumen PICC trimmed to 33 cm was advanced, positioned with its tip at the lower SVC/right atrial junction.  Fluoroscopy during the procedure and fluoro spot radiograph confirms appropriate catheter position.  The catheter was flushed, secured to the skin with Prolene sutures, and covered with a sterile dressing.  Complications:  None.  IMPRESSION: Successful right arm PICC line placement with ultrasound and fluoroscopic guidance.  The catheter is ready for use.  Original Report Authenticated By: Donavan Burnet, M.D.    Scheduled Meds:    . aspirin EC  81 mg Oral Daily  . calcium carbonate  3 tablet Oral TID WC  . carvedilol  3.125 mg Oral BID WC  . furosemide  20 mg Oral BID  . insulin aspart  0-9 Units Subcutaneous Q6H  . isosorbide dinitrate  10 mg Oral BID  . multivitamin with minerals  1 tablet Oral BID  . pantoprazole  80 mg Oral BID AC  . potassium chloride  10 mEq Oral Daily  . potassium chloride  40 mEq Oral BID  .  regadenoson      . sodium chloride  3 mL Intravenous Q12H  . Vitamin D (Ergocalciferol)  50,000 Units Oral Q7 days  . warfarin  2 mg Oral ONCE-1800  . warfarin  4 mg Oral ONCE-1800  . Warfarin - Pharmacist Dosing Inpatient   Does not apply q1800  . DISCONTD: furosemide  60 mg Intravenous BID  . DISCONTD: multivitamin with minerals  1 tablet Oral Daily   Continuous Infusions:    . fat emulsion 250 mL (12/26/11 1723)  . TPN (CLINIMIX) +/- additives  Stopped (12/26/11 0800)  . TPN (CLINIMIX) +/- additives 50 mL/hr at 12/26/11 1723  . TPN (CLINIMIX) +/- additives      ________________________________________________________________________  Time spent: 45 min   [facility]  [facility] Pager [phone] If 8PM-8AM, please contact night-coverage at [email or web address], password [facility] 12/27/2011, 2:34 PM  LOS: 2 days

## 2011-12-27 NOTE — Progress Notes (Signed)
Pt refused her eye exam this evening, pt stated did not now why she would need one and would discuss with MD to set up another eye exam outpt, notified eye MD, will continue to monitor, thanks Lavonda Jumbo RN

## 2011-12-27 NOTE — Progress Notes (Signed)
SATURATION QUALIFICATIONS:  Patient Saturations on Room Air at Rest = 96%  Patient Saturations on [employer] while Ambulating = 90%  Patient Saturations on  Liters of oxygen while Ambulating = n/a%  Statement of medical necessity for home oxygen: Patient ambulated the hallway with academy nurse, patient's O2 sats dropped as low as 90% and then increase back to 100%.

## 2011-12-27 NOTE — Progress Notes (Signed)
Ambulated patient in the hall while monitoring pulse ox. Patient conversing during walk with mild sob, pulse ox upon initiation of ambulation at 94%. At halfway SpO2 at 96%, at nurses station while returning to the patient's room SpO2 at 87% with mild sob, pt still able to converse without difficulty. Patient sat in chair and SpO2 returned to 96% within 30 sec. 1:48 PM 12/27/11

## 2011-12-27 NOTE — Progress Notes (Signed)
Cosigned for [employer] assessment, I/O, note(s), med administration and care plan/education.  Lorretta Harp RN

## 2011-12-27 NOTE — Progress Notes (Signed)
Patient ID: [name] [name], female   DOB: [birth year], 61 y.o.   MRN: [id number]    Subjective:  Denies SSCP, palpitations or Dyspnea   Objective:  Filed Vitals:   12/26/11 2300 12/27/11 0205 12/27/11 0603 12/27/11 0816  BP: 100/60 101/67 107/70 104/63  Pulse: 76 67 72 78  Temp:  98.3 F (36.8 C) 98.7 F (37.1 C)   TempSrc:  Oral Oral   Resp:  16 18   Height:      Weight:   153 lb 8 oz (69.627 kg)   SpO2:  97% 100%     Intake/Output from previous day:  Intake/Output Summary (Last 24 hours) at 12/27/11 0910 Last data filed at 12/27/11 4132  Gross per 24 hour  Intake    660 ml  Output   1050 ml  Net   -390 ml    Physical Exam: Affect appropriate Healthy:  appears stated age HEENT: normal Neck supple with no adenopathy JVP normal no bruits no thyromegaly Lungs clear with no wheezing and good diaphragmatic motion Heart:  S1/S2 no murmur, no rub, gallop or click PMI normal Abdomen: benighn, BS positve, no tenderness, S/P colon surgery no bruit.  No HSM or HJR Distal pulses intact with no bruits No edema Neuro non-focal Skin warm and dry No muscular weakness PIC line RUE  Lab Results: Basic Metabolic Panel:  Basename 12/27/11 0538 12/26/11 0520 12/25/11 1240  NA 132* 133* --  K 4.1 3.4* --  CL 97 97 --  CO2 26 27 --  GLUCOSE 118* 126* --  BUN 40* 33* --  CREATININE 2.74* 2.53* --  CALCIUM 8.4 8.3* --  MG 2.0 -- 2.4  PHOS 5.4* 4.8* --   Liver Function Tests:  Basename 12/27/11 0538 12/26/11 0520 12/25/11 1240  AST 40* -- 32  ALT 25 -- 15  ALKPHOS 148* -- 134*  BILITOT 0.4 -- 0.5  PROT 8.1 -- 8.4*  ALBUMIN 2.4* 2.4* --   No results found for this basename: LIPASE:2,AMYLASE:2 in the last 72 hours CBC:  Basename 12/27/11 0538 12/26/11 0520 12/25/11 0422  WBC 4.9 4.4 --  NEUTROABS -- 2.1 4.0  HGB 8.4* 8.5* --  HCT 26.0* 25.5* --  MCV 86.4 86.1 --  PLT 138* 124* --   Cardiac Enzymes:  Basename 12/26/11 0520 12/25/11 1640 12/25/11 1240    CKTOTAL 39 46 47  CKMB 3.1 3.8 4.3*  CKMBINDEX -- -- --  TROPONINI <0.30 0.59* 0.72*   Fasting Lipid Panel:  Basename 12/25/11 1240  CHOL 92  HDL --  LDLCALC --  TRIG 124  CHOLHDL --  LDLDIRECT --    Imaging: Nm Myocar Multi W/spect W/wall Motion / Ef  12/26/2011  Test indication the patient is a 61 year old with chest pain test to evaluate rule out ischemia  Stress data:  The patient underwent stress testing using Lexiscan per protocol.  Baseline EKG showed sinus rhythm at 75 beats per minute.  Nonspecific ST-T wave changes.  With the infusion of Lexiscan there are no EKG changes.  Nuclear data:  The patient was studied in a 1-day rest stress protocol.  She was injected with 10 mCi technetium 99 labeled Myoview at rest, 30 mCi technetium 99 labeled Myoview at stress. Images were reconstructed the short vertical and horizontal axes.  On review of the raw data there is soft tissue overlying/surrounding the heart.  (Diaphragm, bowel, breast).  In the initial stress images there is a large defect in the inferior wall (base, mid,  distal), inferolateral wall (base, mid, distal).  There is also a defect in the anteroseptal wall (mid, distal) and apex  In comparison, the rest images showed minimal change.  Consistent with minimal periinfarct ischemia.  On gating LVEF was calculated at 33% with diffuse hypokinesis worsened the inferior inferolateral wall.  Impression:  Lexiscan Myoview.  Electrically negative for ischemia. Myoview scan with evidence of scar with minimal periinfarct ischemia  in the inferior, inferolateral, anteroseptal and apical walls.  Cannot exclude some coexistent soft tissue attenuation. LVEF on gating calculated at 33%.  Original Report Authenticated By: [email or web address]    Cardiac Studies:  ECG: 12/25/11  NSR rate 91 normal ECG poor R wave progression   Telemetry: 12/27/2011   NSR no VT   Echo: EF 30% range diffuse hypokinesis  Medications:      . aspirin EC  81 mg Oral Daily   . calcium carbonate  3 tablet Oral TID WC  . carvedilol  3.125 mg Oral BID WC  . furosemide  60 mg Intravenous BID  . insulin aspart  0-9 Units Subcutaneous Q6H  . multivitamin with minerals  1 tablet Oral BID  . pantoprazole  80 mg Oral BID AC  . potassium chloride  10 mEq Oral Daily  . potassium chloride  40 mEq Oral BID  . regadenoson      . regadenoson  0.4 mg Intravenous Once  . sodium chloride  3 mL Intravenous Q12H  . Vitamin D (Ergocalciferol)  50,000 Units Oral Q7 days  . warfarin  2 mg Oral ONCE-1800  . Warfarin - Pharmacist Dosing Inpatient   Does not apply q1800  . DISCONTD: fluconazole  200 mg Intravenous Q24H  . DISCONTD: multivitamin with minerals  1 tablet Oral Daily        . fat emulsion 250 mL (12/26/11 1723)  . TPN (CLINIMIX) +/- additives Stopped (12/26/11 0800)  . TPN (CLINIMIX) +/- additives 50 mL/hr at 12/26/11 1723  . TPN (CLINIMIX) +/- additives      Assessment/Plan:  CHF:  Change to po lasix no ACE due to CRF add nitrates continue beta blocker Pancytompenia:  Relative w/u per primary service   CRF: RUS ok 7/30   Anticoagulation: INR Rx for [facility] line patency  She has no chest pain enzymes negative and ECG no acute changes.  Cath would be high risk with anemia elevated creatinine And need to hold anticoagulation.  Discussed with patient No cath indicated now since myovue nonischemic.  Outpatient f/u with  Me 4 weeks.  Change PO diuretics add nitrates    Consider D/C in am  Charlton Haws 12/27/2011, 9:10 AM

## 2011-12-28 DIAGNOSIS — D689 Coagulation defect, unspecified: Secondary | ICD-10-CM

## 2011-12-28 DIAGNOSIS — B379 Candidiasis, unspecified: Secondary | ICD-10-CM

## 2011-12-28 DIAGNOSIS — R7989 Other specified abnormal findings of blood chemistry: Secondary | ICD-10-CM

## 2011-12-28 LAB — GLUCOSE, CAPILLARY
Glucose-Capillary: 106 mg/dL — ABNORMAL HIGH (ref 70–99)
Glucose-Capillary: 140 mg/dL — ABNORMAL HIGH (ref 70–99)

## 2011-12-28 MED ORDER — CLINIMIX E/DEXTROSE (4.25/25) 4.25 % IV SOLN
INTRAVENOUS | Status: DC
  Filled 2011-12-28: qty 2000

## 2011-12-28 MED ORDER — WARFARIN SODIUM 1 MG PO TABS: 5.0000 mg | ORAL_TABLET | ORAL | Status: DC

## 2011-12-28 MED ORDER — ISOSORBIDE DINITRATE 10 MG PO TABS: 10.0000 mg | ORAL_TABLET | Freq: Two times a day (BID) | ORAL | Status: DC

## 2011-12-28 MED ORDER — POTASSIUM CHLORIDE CRYS ER 10 MEQ PO TBCR: 10.0000 meq | EXTENDED_RELEASE_TABLET | Freq: Every day | ORAL | Status: DC

## 2011-12-28 MED ORDER — CARVEDILOL 3.125 MG PO TABS: 3.1250 mg | ORAL_TABLET | Freq: Two times a day (BID) | ORAL | Status: DC

## 2011-12-28 MED ORDER — FUROSEMIDE 20 MG PO TABS: 20.0000 mg | ORAL_TABLET | Freq: Two times a day (BID) | ORAL | Status: DC

## 2011-12-28 MED ORDER — WARFARIN SODIUM 6 MG PO TABS
6.0000 mg | ORAL_TABLET | Freq: Once | ORAL | Status: DC
  Filled 2011-12-28: qty 1

## 2011-12-28 MED ORDER — WARFARIN SODIUM 10 MG PO TABS
10.0000 mg | ORAL_TABLET | Freq: Once | ORAL | Status: AC
  Administered 2011-12-28: 10 mg via ORAL
  Filled 2011-12-28: qty 1

## 2011-12-28 NOTE — Progress Notes (Signed)
Patient ID: [name] [name], female   DOB: 1950/11/27, 61 y.o.   MRN: [id number]    Subjective:  Denies SSCP, palpitations or Dyspnea   Objective:  Filed Vitals:   12/27/11 1351 12/27/11 1828 12/27/11 2042 12/28/11 0438  BP: 108/67 92/58 102/60 127/73  Pulse: 78 80 79 80  Temp: 98.1 F (36.7 C)  97.8 F (36.6 C) 98.6 F (37 C)  TempSrc: Oral  Oral Oral  Resp: 18 18 18 18   Height:      Weight:    148 lb 5.9 oz (67.3 kg)  SpO2: 98% 99% 100% 100%    Intake/Output from previous day:  Intake/Output Summary (Last 24 hours) at 12/28/11 0844 Last data filed at 12/28/11 0439  Gross per 24 hour  Intake    880 ml  Output   2700 ml  Net  -1820 ml    Physical Exam: Affect appropriate Healthy:  appears stated age HEENT: normal Neck supple with no adenopathy JVP normal no bruits no thyromegaly Lungs clear with no wheezing and good diaphragmatic motion Heart:  S1/S2 no murmur, no rub, gallop or click PMI normal Abdomen: benighn, BS positve, no tenderness, S/P colon surgery no bruit.  No HSM or HJR Distal pulses intact with no bruits No edema Neuro non-focal Skin warm and dry No muscular weakness PIC line RUE  Lab Results: Basic Metabolic Panel:  Basename 12/27/11 0538 12/26/11 0520 12/25/11 1240  NA 132* 133* --  K 4.1 3.4* --  CL 97 97 --  CO2 26 27 --  GLUCOSE 118* 126* --  BUN 40* 33* --  CREATININE 2.74* 2.53* --  CALCIUM 8.4 8.3* --  MG 2.0 -- 2.4  PHOS 5.4* 4.8* --   Liver Function Tests:  Basename 12/27/11 0538 12/26/11 0520 12/25/11 1240  AST 40* -- 32  ALT 25 -- 15  ALKPHOS 148* -- 134*  BILITOT 0.4 -- 0.5  PROT 8.1 -- 8.4*  ALBUMIN 2.4* 2.4* --   No results found for this basename: LIPASE:2,AMYLASE:2 in the last 72 hours CBC:  Basename 12/27/11 0538 12/26/11 0520  WBC 4.9 4.4  NEUTROABS -- 2.1  HGB 8.4* 8.5*  HCT 26.0* 25.5*  MCV 86.4 86.1  PLT 138* 124*   Cardiac Enzymes:  Basename 12/26/11 0520 12/25/11 1640 12/25/11 1240  CKTOTAL  39 46 47  CKMB 3.1 3.8 4.3*  CKMBINDEX -- -- --  TROPONINI <0.30 0.59* 0.72*   Fasting Lipid Panel:  Basename 12/25/11 1240  CHOL 92  HDL --  LDLCALC --  TRIG 124  CHOLHDL --  LDLDIRECT --    Imaging: Nm Myocar Multi W/spect W/wall Motion / Ef  12/26/2011  Test indication the patient is a 61 year old with chest pain test to evaluate rule out ischemia  Stress data:  The patient underwent stress testing using Lexiscan per protocol.  Baseline EKG showed sinus rhythm at 75 beats per minute.  Nonspecific ST-T wave changes.  With the infusion of Lexiscan there are no EKG changes.  Nuclear data:  The patient was studied in a 1-day rest stress protocol.  She was injected with 10 mCi technetium 99 labeled Myoview at rest, 30 mCi technetium 99 labeled Myoview at stress. Images were reconstructed the short vertical and horizontal axes.  On review of the raw data there is soft tissue overlying/surrounding the heart.  (Diaphragm, bowel, breast).  In the initial stress images there is a large defect in the inferior wall (base, mid, distal), inferolateral wall (base, mid, distal).  There is also a defect in the anteroseptal wall (mid, distal) and apex  In comparison, the rest images showed minimal change.  Consistent with minimal periinfarct ischemia.  On gating LVEF was calculated at 33% with diffuse hypokinesis worsened the inferior inferolateral wall.  Impression:  Lexiscan Myoview.  Electrically negative for ischemia. Myoview scan with evidence of scar with minimal periinfarct ischemia  in the inferior, inferolateral, anteroseptal and apical walls.  Cannot exclude some coexistent soft tissue attenuation. LVEF on gating calculated at 33%.  Original Report Authenticated By: [email or web address]    Cardiac Studies:  ECG: 12/25/11  NSR rate 91 normal ECG poor R wave progression   Telemetry: 12/28/2011   NSR no VT   Echo: EF 30% range diffuse hypokinesis  Medications:      . aspirin EC  81 mg Oral Daily  . calcium  carbonate  3 tablet Oral TID WC  . carvedilol  3.125 mg Oral BID WC  . furosemide  20 mg Oral BID  . insulin aspart  0-9 Units Subcutaneous Q6H  . isosorbide dinitrate  10 mg Oral BID  . multivitamin with minerals  1 tablet Oral BID  . pantoprazole  80 mg Oral BID AC  . potassium chloride  10 mEq Oral Daily  . potassium chloride  40 mEq Oral BID  . sodium chloride  3 mL Intravenous Q12H  . Vitamin D (Ergocalciferol)  50,000 Units Oral Q7 days  . warfarin  4 mg Oral ONCE-1800  . Warfarin - Pharmacist Dosing Inpatient   Does not apply q1800  . DISCONTD: furosemide  60 mg Intravenous BID        . fat emulsion 250 mL (12/26/11 1723)  . TPN (CLINIMIX) +/- additives 50 mL/hr at 12/26/11 1723  . TPN (CLINIMIX) +/- additives    . TPN (CLINIMIX) +/- additives      Assessment/Plan:  CHF:  Change to po lasix no ACE due to CRF add nitrates continue beta blocker Pancytompenia:  Relative w/u per primary service   CRF: RUS ok 7/30  No RAS Anticoagulation: INR Rx for [facility] line patency  She has no chest pain enzymes negative and ECG no acute changes.  Cath would be high risk with anemia elevated creatinine And need to hold anticoagulation.  Discussed with patient No cath indicated now since myovue nonischemic.  Outpatient f/u with  Me 4 weeks.  Change PO diuretics add nitrates    Seems to have good diuresis with oral lasix at low dose.  Defer to renal ? Should she be D/C on higher dose  Has F/U with nephrology in September to discuss ? Biopsy.  I will see in 4-6 weeks.  Dr Welton Flakes follows her coumadin At the cancer center  [facility] 12/28/2011, 8:44 AM

## 2011-12-28 NOTE — Progress Notes (Signed)
ANTICOAGULATION CONSULT NOTE - Follow Up Consult  Pharmacy Consult for Coumadin Indication: history of SMA occlusion and thrombophilia  Allergies  Allergen Reactions  . Iohexol Itching    IVP Dye ok if taken with benadryl  . Penicillins Itching   Vital Signs: Temp: 98.6 F (37 C) (08/18 0438) Temp src: Oral (08/18 0438) BP: 127/73 mmHg (08/18 0438) Pulse Rate: 80  (08/18 0438)  Labs:  Basename 12/28/11 0610 12/27/11 0538 12/26/11 1505 12/26/11 0520 12/25/11 1640 12/25/11 1240  HGB -- 8.4* -- 8.5* -- --  HCT -- 26.0* -- 25.5* -- --  PLT -- 138* -- 124* -- --  APTT -- -- -- -- -- --  LABPROT 17.6* 18.7* 20.9* -- -- --  INR 1.42 1.53* 1.77* -- -- --  HEPARINUNFRC -- -- -- -- -- --  CREATININE -- 2.74* -- 2.53* -- --  CKTOTAL -- -- -- 39 46 47  CKMB -- -- -- 3.1 3.8 4.3*  TROPONINI -- -- -- <0.30 0.59* 0.72*    Estimated Creatinine Clearance: 21.8 ml/min (by C-G formula based on Cr of 2.74).  Assessment: 60yof on coumadin pta, admitted with INR of 4. Recently on a 2 week course of fluconazole with last dose given 8/16 - likely reason for elevated INR. Patient reports most recently being on coumadin 1mg  daily. INR today is subtherapeutic and continues to decrease. Will probably need higher doses now that fluconazole discontinued. CBC low but stable. No bleeding noted.  Goal of Therapy:  INR 2-3 Monitor platelets by anticoagulation protocol: Yes   Plan:  1) Increase coumadin to 6mg  x 1 2) Follow up INR in AM  Fredrik Rigger 12/28/2011,11:48 AM

## 2011-12-28 NOTE — Progress Notes (Signed)
   CARE MANAGEMENT NOTE 12/28/2011  Patient:  [name] [name], [name] [name]   Account Number:  [id number]  Date Initiated:  12/28/2011  Documentation initiated by:  [facility]  Subjective/Objective Assessment:     Action/Plan:   Anticipated DC Date:  12/28/2011   Anticipated DC Plan:  HOME W HOME HEALTH SERVICES      DC Planning Services  CM consult      [facility] Choice  Resumption Of Svcs/PTA Provider   Choice offered to / List presented to:  C-3 Spouse        HH arranged  HH-1 RN      [facility] agency  [facility]   Status of service:  In process, will continue to follow Medicare Important Message given?   (If response is "NO", the following Medicare IM given date fields will be blank) Date Medicare IM given:   Date Additional Medicare IM given:    Discharge Disposition:    Per UR Regulation:    If discussed at Long Length of Stay Meetings, dates discussed:    Comments:  12/28/2011 1530 Contacted [facility] and made aware of d/c home with HH and TPN. [facility] did not have order to check PT/INR. Made them aware labs were added to orders that are now in EPIC. Per d/c MD, her TPN needs to go back to original formula, with no added saline. Spoke to [facility], pharmacist, Wynona Canes [phone]. Left message on vm. NCM clarified with pt and she states her original TPN had 2100 ml and it was increased to 2500 ml. Pt reports she has TPN at home with 2500 ml and wants to use that for tonite. And request [facility] send TPN on 8/20. NCM explained it will have to cleared by MD and [facility] pharmacist. Waiting call back from pharmacy. Wynona Canes states she can follow up with pt/husband this evening to reset pump to receive the 2100 ml. NCM explained orders are on d/c instructions for pt.  Isidoro Donning RN CCM Case Mgmt phone [phone]

## 2011-12-28 NOTE — Discharge Summary (Addendum)
Physician Discharge Summary  [name] [name] JYN:[id number] DOB: [birth year] DOA: 12/25/2011  PCP: [name], MD  Admit date: 12/25/2011 Discharge date: 12/28/2011  Recommendations for Outpatient Follow-up:  1. Home health RN for INR on Tuesday and daily weights 2. F/u with PCP on Thursday or Friday for BP check and Bmet - note pt has been discharged on Lasix and Postassium 3. Outpt ophthalmological exam (fundoscopy for fungemia ie. Endophthalmitis) 4. Outpt f/u with Cardiololgy for cardiac cath 5. 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 She was vigorously hydrated during the prior admission. Patient denies any chest pain and she does not have any cardiac history she knows of. In the emergency room she was found to be in pulmonary edema referred for admission.    Hospital Course:  *Pulmonary edema/ Acute on chronic systolic CHF - EF 30% with moderate LVH and wall motion abnormalities  She has been diuresed and due to renal failure, she is predisposed to developing fluid over load- will place on Lasix daily upon discharge and her follow up with PCP in 4-5 days for Bmet and exam- Lasix may need to be adjusted or  discontinued based on clinical findings- - Lexiscan completed - prior scar noted- cath postponed due to ARF -  - no longer requiring O2 at rest or after exertion but continues to have crackles at bases which is also why I feel a few more days of Lasix will be beneficial.  -of note pt tells me today that additional saline had been added to her TPN after her last discharge- this, also may have caused the fluid overload she presented with- I have spoken with case management  and advised that her home health agency/ pharmacy decrease the amount of fluid in her TPN back to her usual dose.  - f/u with Cardiology in 4 wks.  - she has been started on Coreg, Isosorbide dinitrate by cardiology as well  Renal failure  Relatively acute as renal function was normal in 1/13- Cr climbed slightly in 5/13 to 1.13 and then to 2.5 in 8/13  It was suspected to be related to fungemia and dehydration when admitted in July- she was to follow up but has not yet had the chance  further work up has been initiated by Renal service- appreciate their input- will likely need biopsy as oupt if resolution does not recur Will need to f/u with [facility] - has appt for Sept.   Fungemia- Candidemia  She was due to stop Fluconazole on 8/16 per ID note from 8/5- Dr Comer-It was discontinued appropriately  - she has not had a fundoscopy exam- Have called and left message for Opthalmology (Dr Rankin's office) for a consult but no one has come to examine her  At this point, will not delay her discharge- pt will need oupt f/u with opthalmology- she has been explained the importance of it and agrees to f/u for a fundoscopic exam  Acquired short bowel syndrome  Cont TNA   Thrombophilia  Cont Coumadin- INR quite low today- per pt she takes 5 mg at home regularly- will give her 10 mg today prior to d/c and have an INR check later this week by home health.    Chronic Thrombocytopenia  Appears to be mild and at baseline- Plt were  lower on last admission possibly due to sepsis   Consultations:  [facility]  [facility]  Discharge Exam: Filed Vitals:   12/28/11 1330  BP: 113/69  Pulse: 71  Temp: 98 F (36.7 C)  Resp: 20   Filed Vitals:   12/27/11 1828 12/27/11 2042 12/28/11 0438 12/28/11 1330  BP: 92/58 102/60 127/73 113/69  Pulse: 80 79 80 71  Temp:  97.8 F (36.6 C) 98.6 F (37 C) 98 F (36.7 C)  TempSrc:  Oral Oral Oral  Resp: 18 18 18 20   Height:      Weight:   67.3 kg (148 lb 5.9 oz)   SpO2: 99% 100% 100% 100%    General: alert and oriented - anxious to be discharged Cardiovascular: RRR, no murmurs Respiratory: crackles at bases b/l  Discharge Instructions  Discharge Orders    Future Appointments: Provider: Department: Dept Phone: Center:   01/08/2012 8:00 AM Beverely Pace [facility] [phone] None   01/08/2012 8:30 AM [facility] [phone] None   02/05/2012 8:00 AM Radene Gunning [facility] [phone] None   02/05/2012 8:30 AM [facility] [phone] None   03/04/2012 8:00 AM Radene Gunning [facility] [phone] None   03/04/2012 8:30 AM [facility] [phone] None   04/02/2012 8:00 AM Beverely Pace Sh[facility]ate [facility] [phone] None   04/02/2012 8:30 AM [facility] [phone] None   04/29/2012 8:30 AM Beverely Pace Sh[facility] [phone] None   04/29/2012 9:00 AM Victorino December, MD [facility] [phone] None     Future Orders Please Complete By Expires   Diet - low sodium heart healthy      Increase activity slowly      Discharge instructions      Comments:   Home health to check INR on Tuesday- will need home health RN to check daily BPs and monitor daily weights Dr Beverely Low to check Bmet and INR towards the end of the week.  Daily weights- show Dr Beverely Low YOU NEED TO SEE AN EYE DOCTOR FOR AN EYE EXAM  (fungus!!) Limit salt intake     Medication List  As of 12/28/2011  4:50 PM   STOP taking these medications         calcium carbonate 750 MG chewable tablet      ciprofloxacin 500 MG tablet      fluconazole 2 mg/mL IVPB         TAKE these medications         ADULT TPN   Inject 2,100 mLs into the vein continuous. Infuse over 10 hours with 1 hour ramp up and 1 hour ramp down. Total bag per day provides: 250g Dextrose 70%, 100g Travasol 10%, 45g Intralipids 30%, various electrolytes, MVI, TE, Zinc, selenium.      BENADRYL 25 mg capsule   Generic drug: diphenhydrAMINE   Take 25 mg  by mouth every 6 (six) hours as needed. For itching/sleep.      carvedilol 3.125 MG tablet   Commonly known as: COREG   Take 1 tablet (3.125 mg total) by mouth 2 (two) times daily with a meal.      furosemide 20 MG tablet   Commonly known as: LASIX   Take 1 tablet (20 mg total) by mouth 2 (two) times daily.      isosorbide dinitrate 10 MG tablet   Commonly known as: ISORDIL   Take 1 tablet (10 mg total) by mouth 2 (two) times daily.      multivitamin with minerals Tabs   Take 1 tablet by mouth daily.      omeprazole 40 MG capsule   Commonly known as: PRILOSEC   Take 40 mg by mouth daily.      potassium chloride 10 MEQ tablet   Commonly known as: K-DUR,KLOR-CON   Take 1 tablet (10 mEq total) by mouth daily.      promethazine 25 MG tablet   Commonly known as: PHENERGAN   Take 25 mg by mouth every 6 (six) hours as needed. For nausea.      TPN ELECTROLYTES IV   Inject 1 each into the vein at bedtime.      ursodiol 300 MG capsule   Commonly known as: ACTIGALL   TAKE 1 CAPSULE BY MOUTH TWICE DAILY      VANIQA 13.9 % cream   Generic drug: Eflornithine HCl   Apply 1 application topically 2 (two) times daily with a meal. Applied to facial hair.      Vitamin D (Ergocalciferol) 50000 UNITS Caps   Commonly known as: DRISDOL   ALTERNATE TAKING 1 CAPSULE BY MOUTH EVERY OTHER DAY AND 2  CAPSULES BY MOUTH EVERY OTHER DAY      warfarin 1 MG tablet   Commonly known as: COUMADIN   Take 5 tablets (5 mg total) by mouth as directed.           Follow-up Information    Follow up with [facility]. (Home Health RN)    Contact information:   [phone]          The results of significant diagnostics from this hospitalization (including imaging, microbiology, ancillary and laboratory) are listed below for reference.    Significant Diagnostic Studies: Dg Chest 2 View  12/25/2011  *RADIOLOGY REPORT*  Clinical Data: Short of breath.  CHEST - 2 VIEW  Comparison: 12/09/2010.  Findings: Right upper extremity PICC is present.  Moderate CHF is present with right greater than left bilateral basilar pleural effusions, atelectasis and interstitial and alveolar edema.  Aortic arch atherosclerosis is present.  IMPRESSION: Moderate CHF/volume overload.  Original Report Authenticated By: Andreas Newport, M.D.   Dg Chest 2 View  12/09/2011  *RADIOLOGY REPORT*  Clinical Data: Fever and weakness.  Ex-smoker.  CHEST - 2 VIEW  Comparison: CT of 10/24/2011 and plain film of 01/17/2011.  Findings: A left-sided PICC line which is unchanged and terminates at the low SVC. Midline trachea.  Borderline cardiomegaly.  Age advanced aortic atherosclerosis. No pleural effusion or pneumothorax.  The reticular nodular opacities described on the prior plain film are improved to resolved.  Mild interstitial prominence remains, and is likely related to the clinical history of  prior smoking. No lobar consolidation.  IMPRESSION: Interstitial thickening, likely related to prior smoking / chronic bronchitis.  No evidence of acute superimposed pneumonia.  Original Report Authenticated By: Consuello Bossier, M.D.  US Renal  12/09/2011  *RADIOLOGY REPORT*  Clinical Data: Hematuria.  History of diabetes.  RENAL/URINARY TRACT ULTRASOUND COMPLETE  Comparison:  Abdominal ultrasound 10/29/2011.  Findings:  Right Kidney:   Mild diffuse increased echogenicity of the renal parenchyma is nonspecific.  No focal cystic or solid renal lesions. 12.9 cm in length.  No hydronephrosis. No definite calculi.  Left Kidney:  Mild diffuse increased echogenicity of the renal parenchyma is nonspecific.  No focal cystic or solid renal lesions. 13.6 cm in length.  No hydronephrosis. No definite calculi.  Bladder:  Well distended without focal wall abnormalities.  IMPRESSION: 1.  Mild diffusely increased echogenicity of the the renal parenchyma bilaterally is nonspecific, but could suggest underlying medical renal disease. 2.  No focal cystic or solid renal lesions, and no definite evidence of calculi within the collecting systems.  Original Report Authenticated By: Florencia Reasons, M.D.   Nm Myocar Multi W/spect W/wall Motion / Ef  12/26/2011  Test indication the patient is a 61 year old with chest pain test to evaluate rule out ischemia  Stress data:  The patient underwent stress testing using Lexiscan per protocol.  Baseline EKG showed sinus rhythm at 75 beats per minute.  Nonspecific ST-T wave changes.  With the infusion of Lexiscan there are no EKG changes.  Nuclear data:  The patient was studied in a 1-day rest stress protocol.  She was injected with 10 mCi technetium 99 labeled Myoview at rest, 30 mCi technetium 99 labeled Myoview at stress. Images were reconstructed the short vertical and horizontal axes.  On review of the raw data there is soft tissue overlying/surrounding the heart.  (Diaphragm, bowel, breast).  In the initial stress images there is a large defect in the inferior wall (base, mid, distal), inferolateral wall (base, mid, distal).  There is also a defect in the anteroseptal wall (mid, distal) and apex  In comparison, the rest images showed minimal change.  Consistent with minimal periinfarct ischemia.  On gating LVEF was calculated at 33% with diffuse hypokinesis worsened the inferior inferolateral wall.  Impression:   Lexiscan Myoview.  Electrically negative for ischemia. Myoview scan with evidence of scar with minimal periinfarct ischemia  in the inferior, inferolateral, anteroseptal and apical walls.  Cannot exclude some coexistent soft tissue attenuation. LVEF on gating calculated at 33%.  Original Report Authenticated By: [email or web address]   Ir Fluoro Guide Cv Line Right  12/17/2011  *RADIOLOGY REPORT*  Clinical Data: Total parenteral nutrition requirements. Malnutrition.  PICC LINE PLACEMENT WITH ULTRASOUND AND FLUOROSCOPIC  GUIDANCE  Fluoroscopy Time: 1.1 minutes.  The right arm was prepped with chlorhexidine, draped in the usual sterile fashion using maximum barrier technique (cap and mask, sterile gown, sterile gloves, large sterile sheet, hand hygiene and cutaneous antisepsis) and infiltrated locally with 1% Lidocaine.  Ultrasound demonstrated patency of the right basilic vein, and this was documented with an image.  Under real-time ultrasound guidance, this vein was accessed with a 21 gauge micropuncture needle and image documentation was performed.  The needle was exchanged over a guidewire for a peel-away sheath through which a five [place] double lumen PICC trimmed to 33 cm was advanced, positioned with its tip at the lower SVC/right atrial junction.  Fluoroscopy during the procedure and fluoro spot radiograph confirms appropriate catheter position.  The catheter was flushed, secured to the skin with Prolene sutures, and covered with a sterile dressing.  Complications:  None.  IMPRESSION: Successful right arm PICC line placement with ultrasound and fluoroscopic guidance.  The catheter  is ready for use.  Original Report Authenticated By: Donavan Burnet, M.D.   Ir US Guide Vasc Access Right  12/17/2011  *RADIOLOGY REPORT*  Clinical Data: Total parenteral nutrition requirements. Malnutrition.  PICC LINE PLACEMENT WITH ULTRASOUND AND FLUOROSCOPIC  GUIDANCE  Fluoroscopy Time: 1.1 minutes.  The right arm was prepped with  chlorhexidine, draped in the usual sterile fashion using maximum barrier technique (cap and mask, sterile gown, sterile gloves, large sterile sheet, hand hygiene and cutaneous antisepsis) and infiltrated locally with 1% Lidocaine.  Ultrasound demonstrated patency of the right basilic vein, and this was documented with an image.  Under real-time ultrasound guidance, this vein was accessed with a 21 gauge micropuncture needle and image documentation was performed.  The needle was exchanged over a guidewire for a peel-away sheath through which a five [place] double lumen PICC trimmed to 33 cm was advanced, positioned with its tip at the lower SVC/right atrial junction.  Fluoroscopy during the procedure and fluoro spot radiograph confirms appropriate catheter position.  The catheter was flushed, secured to the skin with Prolene sutures, and covered with a sterile dressing.  Complications:  None.  IMPRESSION: Successful right arm PICC line placement with ultrasound and fluoroscopic guidance.  The catheter is ready for use.  Original Report Authenticated By: Donavan Burnet, M.D.    Microbiology: Recent Results (from the past 240 hour(s))  MRSA PCR SCREENING     Status: Normal   Collection Time   12/25/11  7:08 AM      Component Value Range Status Comment   MRSA by PCR NEGATIVE  NEGATIVE Final      Labs: Basic Metabolic Panel:  Lab 12/27/11 1191 12/26/11 0520 12/25/11 1240 12/25/11 0422  NA 132* 133* -- 132*  K 4.1 3.4* -- 3.5  CL 97 97 -- 95*  CO2 26 27 -- 28  GLUCOSE 118* 126* -- 196*  BUN 40* 33* -- 33*  CREATININE 2.74* 2.53* -- 2.50*  CALCIUM 8.4 8.3* -- 8.8  MG 2.0 -- 2.4 --  PHOS 5.4* 4.8* 4.6 --   Liver Function Tests:  Lab 12/27/11 0538 12/26/11 0520 12/25/11 1240  AST 40* -- 32  ALT 25 -- 15  ALKPHOS 148* -- 134*  BILITOT 0.4 -- 0.5  PROT 8.1 -- 8.4*  ALBUMIN 2.4* 2.4* 2.5*   No results found for this basename: LIPASE:5,AMYLASE:5 in the last 168 hours No results found for this  basename: AMMONIA:5 in the last 168 hours CBC:  Lab 12/27/11 0538 12/26/11 0520 12/25/11 0422  WBC 4.9 4.4 7.0  NEUTROABS -- 2.1 4.0  HGB 8.4* 8.5* 10.0*  HCT 26.0* 25.5* 30.0*  MCV 86.4 86.1 86.7  PLT 138* 124* 146*   Cardiac Enzymes:  Lab 12/26/11 0520 12/25/11 1640 12/25/11 1240 12/25/11 0422  CKTOTAL 39 46 47 --  CKMB 3.1 3.8 4.3* --  CKMBINDEX -- -- -- --  TROPONINI <0.30 0.59* 0.72* 0.61*   BNP: BNP (last 3 results)  Basename 12/28/11 0615 12/25/11 0422  PROBNP 12095.0* 23321.0*   CBG:  Lab 12/28/11 0610 12/27/11 2341 12/27/11 1757 12/27/11 1207 12/27/11 0634  GLUCAP 106* 140* 99 92 90    Time coordinating discharge: >45 minutes  Signed:  Chantee Cerino  [facility] 12/28/2011, 4:50 PM

## 2011-12-28 NOTE — Progress Notes (Signed)
PARENTERAL NUTRITION CONSULT NOTE - Follow Up  Pharmacy Consult for TNA Indication: Short Gut Syndrome  Allergies  Allergen Reactions  . Iohexol Itching    IVP Dye ok if taken with benadryl  . Penicillins Itching   Patient Measurements: Height: 5\' 8"  (172.7 cm) Weight: 148 lb 5.9 oz (67.3 kg) (scale b) IBW/kg (Calculated) : 63.9  Adjusted Body Weight: n/a Usual Weight: 70.4 kg  Vital Signs: Temp: 98.6 F (37 C) (08/18 0438) Temp src: Oral (08/18 0438) BP: 127/73 mmHg (08/18 0438) Pulse Rate: 80  (08/18 0438) Intake/Output from previous day: 08/17 0701 - 08/18 0700 In: 1120 [P.O.:1120] Out: 2700 [Urine:2700]  Labs:  Basename 12/28/11 0610 12/27/11 0538 12/26/11 1505 12/26/11 0520  WBC -- 4.9 -- 4.4  HGB -- 8.4* -- 8.5*  HCT -- 26.0* -- 25.5*  PLT -- 138* -- 124*  APTT -- -- -- --  INR 1.42 1.53* 1.77* --    Basename 12/27/11 0538 12/26/11 0520 12/25/11 1240  NA 132* 133* --  K 4.1 3.4* --  CL 97 97 --  CO2 26 27 --  GLUCOSE 118* 126* --  BUN 40* 33* --  CREATININE 2.74* 2.53* --  LABCREA -- -- --  CREAT24HRUR -- -- --  CALCIUM 8.4 8.3* --  MG 2.0 -- 2.4  PHOS 5.4* 4.8* 4.6  PROT 8.1 -- 8.4*  ALBUMIN 2.4* 2.4* 2.5*  AST 40* -- 32  ALT 25 -- 15  ALKPHOS 148* -- 134*  BILITOT 0.4 -- 0.5  BILIDIR -- -- 0.2  IBILI -- -- 0.3  PREALBUMIN -- -- 13.3*  TRIG -- -- 124  CHOLHDL -- -- --  CHOL -- -- 92   Estimated Creatinine Clearance: 21.8 ml/min (by C-G formula based on Cr of 2.74).   Basename 12/28/11 0610 12/27/11 2341 12/27/11 1757  GLUCAP 106* 140* 99   Medical History: Past Medical History  Diagnosis Date  . Short bowel syndrome   . Abnormal LFTs   . Vitamin d deficiency   . Personal history of colonic polyps   . Thrombophilia   . Diabetes mellitus     resolved after weight loss  . Obesity     prior to weight loss  . Allergic rhinosinusitis   . At risk for dental problems   . Fracture of left clavicle   . Osteoporosis   . Anemia of  chronic disease   . Renal insufficiency   . Atypical nevus   . Pathologic fracture of neck of femur   . Pancytopenia 10/07/2011  . Small bowel ischemia   . Superior mesenteric artery stenosis   . Bacterial overgrowth syndrome    Medications:  Scheduled:     . aspirin EC  81 mg Oral Daily  . calcium carbonate  3 tablet Oral TID WC  . carvedilol  3.125 mg Oral BID WC  . furosemide  20 mg Oral BID  . insulin aspart  0-9 Units Subcutaneous Q6H  . isosorbide dinitrate  10 mg Oral BID  . multivitamin with minerals  1 tablet Oral BID  . pantoprazole  80 mg Oral BID AC  . potassium chloride  10 mEq Oral Daily  . potassium chloride  40 mEq Oral BID  . sodium chloride  3 mL Intravenous Q12H  . Vitamin D (Ergocalciferol)  50,000 Units Oral Q7 days  . warfarin  4 mg Oral ONCE-1800  . Warfarin - Pharmacist Dosing Inpatient   Does not apply q1800  . DISCONTD: furosemide  60  mg Intravenous BID  . DISCONTD: multivitamin with minerals  1 tablet Oral Daily   Insulin Requirements in the past 24 hours:  TPN started yesterday - CBG 99-140 -Sensitive SSI currently ordered and she has not required any insulin yet.  Nutritional Goals: Home TNA provides ~ 1700 Kcal and 61 grams of protein daily.   She receives Lipids three times/week as well and also eats oral foods (doubtful absorption).  RD input for nutritional goals noted and appreciated.  Assessment: 61 y.o F recently hospitalized at [facility] from 7/29 - 8/7  for fungemia (new PICC line placed 8/6) and acute renal failure and readmitted to [facility] on 8/15 for acute on chronic SHF exacerbation . 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 PTA, the patient was on a cyclic TNA infused over 10 hours each day which provided an average of 1682 kcal (224 kcal (61g) protein and 1224 kcal (360g) CHO daily with 500 kcal (50g) lipids  3x/week, zinc/selenium 3x/week). The patient also is able to eat in addition to the TNA -- however she likely has minimal absorption from this. She is receiving Warfarin therapy PTA with INR yesterday at 4.0.  She also has some mild thrombocytopenia.  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 On chronic TNA. On heart health diet, PPI po Endo: No hx DM. CBGs at goal of < 150. Lytes: No new labs this AM - On KCl 10 mEq daily, calcium carbonate tidwc, multivitamins po (bid) in addition to supplementation in TNA on MWF (see RD rec), vitamin D Renal: Hx recent ARI with SCr 2.5, CrCl~20-25 ml/min.  Pulm: RA Cards: Admitted with acute on chronic SHF exacerbation. BNP [id number]. BP/HR wnl. On ASA, coreg, lasix.  No ACE due to increased SCreat. Hepatobil: LFTs/TBili wnl, alk phos 134, TG wnl.  Prealbumin low at 13.3 despite TNA.  Repeat labs on Monday. Neuro: A&O, pain score 0 ID: Hx recent fungemia (candida parapsilosis and famata). To get outpatient eye exam to r/o endophthalmitis. Pt currently asymptomatic of fungemia. New PICC line placed 8/6 Afebrile, WBc WNL, continues on IV fluconazole Best Practices: PPI po/Warfarin  Plan:  1. Continue Clinimix E 4.25/25, 1450 ml cycled over 12 hours (6p-6a): 50 ml/hr x 1 hour, 135 ml/hr x 10 hours, then 50 ml/hr x 1 hour 2. Provide trace elements, lipids, and MVI on MWF only 2/2 national shortage 3.  Will increase MVI supplementation to ensure adequate absorption and continue to provide MVI in TNA on MWF only. 4. F/u TNA labs  Nadara Mustard, PharmD., MS Clinical Pharmacist Pager:  [phone]  Thank you for allowing pharmacy to be part of this patients care team. 12/28/2011 7:56 AM

## 2011-12-28 NOTE — Progress Notes (Signed)
Pt ambulated in hallway SPO2 on RA at rest 96% during ambulation pt. spo2 remained between 94-96% RA. Pt denies any SOB. [name] [name]

## 2011-12-29 ENCOUNTER — Telehealth: Payer: Self-pay | Admitting: Internal Medicine

## 2011-12-29 ENCOUNTER — Telehealth: Payer: Self-pay | Admitting: *Deleted

## 2011-12-29 LAB — PROTEIN ELECTROPHORESIS, SERUM
Beta Globulin: 4.4 % — ABNORMAL LOW (ref 4.7–7.2)
Gamma Globulin: 40.6 % — ABNORMAL HIGH (ref 11.1–18.8)
M-Spike, %: NOT DETECTED g/dL
Total Protein ELP: 7.6 g/dL (ref 6.0–8.3)

## 2011-12-29 LAB — UIFE/LIGHT CHAINS/TP QN, 24-HR UR
Albumin, U: DETECTED
Free Lambda Lt Chains,Ur: 13.3 mg/dL — ABNORMAL HIGH (ref 0.02–0.67)
Gamma Globulin, Urine: DETECTED — AB

## 2011-12-29 NOTE — Telephone Encounter (Signed)
Annice Pih, RN from [facility] called. Pt PT 21.8 INR 1.8. Pt currently taking 5mg  Daily. Discharged from [facility] 12/28/11 Reviewed with MD Informed Annice Pih, per MD pt to continue 5mg  Coumadin Daily, recheck labs in 1 week. Annice Pih verbalized understanding will call with results.

## 2011-12-29 NOTE — Progress Notes (Signed)
APPT MADE FOR 02/10/12 AT 9:15 AM .[name] [name]

## 2011-12-29 NOTE — Telephone Encounter (Signed)
Noted  

## 2012-01-02 ENCOUNTER — Ambulatory Visit (INDEPENDENT_AMBULATORY_CARE_PROVIDER_SITE_OTHER): Payer: BC Managed Care – PPO | Admitting: Family Medicine

## 2012-01-02 ENCOUNTER — Encounter: Payer: Self-pay | Admitting: Family Medicine

## 2012-01-02 VITALS — BP 110/70 | HR 80 | Temp 98.0°F | Ht 67.75 in | Wt 153.0 lb

## 2012-01-02 DIAGNOSIS — E876 Hypokalemia: Secondary | ICD-10-CM

## 2012-01-02 DIAGNOSIS — I5023 Acute on chronic systolic (congestive) heart failure: Secondary | ICD-10-CM

## 2012-01-02 DIAGNOSIS — E871 Hypo-osmolality and hyponatremia: Secondary | ICD-10-CM

## 2012-01-02 DIAGNOSIS — N179 Acute kidney failure, unspecified: Secondary | ICD-10-CM

## 2012-01-02 DIAGNOSIS — I509 Heart failure, unspecified: Secondary | ICD-10-CM

## 2012-01-02 DIAGNOSIS — B379 Candidiasis, unspecified: Secondary | ICD-10-CM

## 2012-01-02 MED ORDER — CLOBETASOL PROPIONATE 0.05 % EX OINT: 1.0000 "application " | TOPICAL_OINTMENT | Freq: Two times a day (BID) | CUTANEOUS | Status: DC

## 2012-01-02 NOTE — Patient Instructions (Signed)
Follow up w/ Renal and Cards as scheduled We'll call you with your eye appt Call me for anything!!! Hang in there!!

## 2012-01-02 NOTE — Progress Notes (Signed)
  Subjective:    Patient ID: [name] [name], female    DOB: [birth year], 61 y.o.   MRN: [id number]  HPI Went to ER w/ urinary retention, pain, had temp of 103.  Admitted in ARF, treated w/ aggressive hydration.  Had fungal infxn in PICC- this was removed.  Had new PICC inserted and was d/c'd.  Home 3-4 days when she awoke w/ severe SOB (8/15).  Was dx'd w/ CHF due to volume overload.  Cards and renal was consulted.  Has f/u w/ Renal on 9/5 w/ Dr Kathrene Bongo, Cards 10/1 w/ Dr Eden Emms.  Was told she needs an eye exam due to fungal infxn.  Needs ophthalmology.  Has not had cath as reported in one note, had nuclear stress.  D/c weight at home has been stable.    No SOB, CP/pressure, no change in urination.  No confusion/mental clouding.   Review of Systems For ROS see HPI     Objective:   Physical Exam  Vitals reviewed. Constitutional: She appears well-developed and well-nourished. No distress.  HENT:  Head: Normocephalic and atraumatic.  Neck: Normal range of motion. Neck supple.  Cardiovascular: Normal rate, regular rhythm and normal heart sounds.   Pulmonary/Chest: Effort normal and breath sounds normal. No respiratory distress. She has no wheezes. She has no rales.  Abdominal: Soft. Bowel sounds are normal. She exhibits no distension. There is no tenderness.  Musculoskeletal: She exhibits no edema.  Lymphadenopathy:    She has no cervical adenopathy.          Assessment & Plan:

## 2012-01-05 ENCOUNTER — Telehealth: Payer: Self-pay | Admitting: Medical Oncology

## 2012-01-05 NOTE — Assessment & Plan Note (Signed)
New.  Pt w/ systemic fungemia.  Needs eye exam to ensure no fungal elements present.  Will refer.  Pt expressed understanding and is in agreement w/ plan.

## 2012-01-05 NOTE — Telephone Encounter (Signed)
Spoke to patient, per MD patient to take Coumadin 5 mg and 7.5 mg every other day.  Patient expressed understanding and confirmed instructions.

## 2012-01-05 NOTE — Telephone Encounter (Signed)
[name] [name] with [facility] called with patient's PT/INR results from 01/05/2012.  PT 19.0, INR 1.6  Will review with MD

## 2012-01-05 NOTE — Assessment & Plan Note (Signed)
New to provider.  Likely a combo of aggressive rehydration in setting of acute renal failure.  No sxs of volume overload on PE today.  Weight is stable.  Will get electrolytes early next week from [facility].  Will not draw today.  Will follow closely.

## 2012-01-05 NOTE — Telephone Encounter (Signed)
How much coumadin is she on?

## 2012-01-05 NOTE — Assessment & Plan Note (Signed)
New to provider.  Again likely due to use of diuretics.  Will get labs next week w/ [facility].  Will follow.

## 2012-01-05 NOTE — Assessment & Plan Note (Signed)
New.  Likely due to overdiuresis in setting of CHF.  Pt to have labs drawn by [facility] early next week.  Will hold off on additional labs today and adjust diuretics based on labs next week.

## 2012-01-05 NOTE — Assessment & Plan Note (Signed)
New to provider.  Has appt upcoming w/ [facility].  Pt denies sxs of uremia or volume overload.  Uncertain as to whether pt's renal failure will continue to improve w/ time or if this is pt's new baseline.  Will continue to follow along and assist as able.

## 2012-01-07 ENCOUNTER — Other Ambulatory Visit: Payer: Self-pay | Admitting: Medical Oncology

## 2012-01-07 NOTE — Telephone Encounter (Signed)
I need to see her at next availble appointment with me

## 2012-01-08 ENCOUNTER — Telehealth: Payer: Self-pay | Admitting: Oncology

## 2012-01-08 ENCOUNTER — Ambulatory Visit (HOSPITAL_BASED_OUTPATIENT_CLINIC_OR_DEPARTMENT_OTHER): Payer: BC Managed Care – PPO

## 2012-01-08 ENCOUNTER — Other Ambulatory Visit (HOSPITAL_BASED_OUTPATIENT_CLINIC_OR_DEPARTMENT_OTHER): Payer: BC Managed Care – PPO | Admitting: Lab

## 2012-01-08 VITALS — BP 119/79 | HR 75 | Temp 97.2°F

## 2012-01-08 DIAGNOSIS — D638 Anemia in other chronic diseases classified elsewhere: Secondary | ICD-10-CM

## 2012-01-08 DIAGNOSIS — D689 Coagulation defect, unspecified: Secondary | ICD-10-CM

## 2012-01-08 LAB — PROTIME-INR: Protime: 16.8 Seconds — ABNORMAL HIGH (ref 10.6–13.4)

## 2012-01-08 LAB — CBC WITH DIFFERENTIAL/PLATELET
Basophils Absolute: 0 10*3/uL (ref 0.0–0.1)
Eosinophils Absolute: 0.5 10*3/uL (ref 0.0–0.5)
HGB: 8.6 g/dL — ABNORMAL LOW (ref 11.6–15.9)
LYMPH%: 27.2 % (ref 14.0–49.7)
MCV: 86.6 fL (ref 79.5–101.0)
MONO%: 10.4 % (ref 0.0–14.0)
NEUT#: 2.7 10*3/uL (ref 1.5–6.5)
Platelets: 107 10*3/uL — ABNORMAL LOW (ref 145–400)

## 2012-01-08 MED ORDER — DARBEPOETIN ALFA-POLYSORBATE 300 MCG/0.6ML IJ SOLN
300.0000 ug | Freq: Once | INTRAMUSCULAR | Status: AC
  Administered 2012-01-08: 300 ug via SUBCUTANEOUS
  Filled 2012-01-08: qty 0.6

## 2012-01-08 NOTE — Telephone Encounter (Signed)
lmonvm adviisng the pt of her oct appts °

## 2012-01-13 ENCOUNTER — Telehealth: Payer: Self-pay | Admitting: *Deleted

## 2012-01-13 ENCOUNTER — Other Ambulatory Visit: Payer: Self-pay | Admitting: *Deleted

## 2012-01-13 ENCOUNTER — Other Ambulatory Visit: Payer: Self-pay | Admitting: Internal Medicine

## 2012-01-13 MED ORDER — ISOSORBIDE DINITRATE 10 MG PO TABS: 10.0000 mg | ORAL_TABLET | Freq: Two times a day (BID) | ORAL | Status: DC

## 2012-01-13 MED ORDER — FUROSEMIDE 20 MG PO TABS: 20.0000 mg | ORAL_TABLET | Freq: Two times a day (BID) | ORAL | Status: DC

## 2012-01-13 MED ORDER — CARVEDILOL 3.125 MG PO TABS: 3.1250 mg | ORAL_TABLET | Freq: Two times a day (BID) | ORAL | Status: DC

## 2012-01-13 NOTE — Telephone Encounter (Signed)
Rx sent 

## 2012-01-13 NOTE — Telephone Encounter (Signed)
Per MD, Instructed pt to beginCoumadin 7.5mg  Daily recheck labs in 1 week. Pt advised [facility] took labs Monday 9/2, results pending at this time..  Informed pt I will call her back if MD has further instructions regarding a change in her dosing. Otherwise to continue Coumadin 7.5mg  daily. Pt verabalzed understanding.

## 2012-01-13 NOTE — Telephone Encounter (Signed)
Pt given Rx while in hosp Pt is not schedule to see card until October. Pt had ED f/u 01-02-12 .Please advise ok to fill meds from hosp

## 2012-01-13 NOTE — Telephone Encounter (Signed)
Coumadin 7.5 mg daily recheck in 1 week

## 2012-01-13 NOTE — Telephone Encounter (Signed)
Pt called LMOVM " I had my labs drawn on 8/29 ( at [facility]), do I need to change my coumadin dose or leave it alone?"  [facility] draws pt labs every Monday.  Last note on 8/26 Raynelle Fanning with [facility] called with patient's PT/INR results from 01/05/2012. PT 19.0, INR 1.6  Pt currently taking Coumadin 5 mg and 7.5 mg every other day.  Will review with MD for further instructions

## 2012-01-13 NOTE — Telephone Encounter (Signed)
Ok to refill 1 month with 1 refill

## 2012-01-20 ENCOUNTER — Encounter: Payer: Self-pay | Admitting: Internal Medicine

## 2012-01-20 ENCOUNTER — Other Ambulatory Visit: Payer: Self-pay | Admitting: Oncology

## 2012-01-21 ENCOUNTER — Telehealth: Payer: Self-pay | Admitting: Medical Oncology

## 2012-01-21 NOTE — Telephone Encounter (Deleted)
Per MD, patient to increase Coumadin to 10 mg daily, recheck labs in 1 week.  LMOVM with instructions and requested a return call to confirm message and instructions.

## 2012-01-21 NOTE — Telephone Encounter (Signed)
Message copied by Tylene Fantasia on Wed Jan 21, 2012  4:14 PM ------      Message from: Victorino December      Created: Wed Jan 21, 2012  4:02 PM       Increase coumadin to 10 mg daily, recheck in 1 week      ----- Message -----         From: Tylene Fantasia, RN         Sent: 01/21/2012   2:03 PM           To: Victorino December, MD            9/10 PT 17.6      INR 1.39            Currently taking Coumadin 7.5 mg daily

## 2012-01-22 ENCOUNTER — Telehealth: Payer: Self-pay | Admitting: Medical Oncology

## 2012-01-22 NOTE — Telephone Encounter (Signed)
Per MD, patient to begin taking 10 mg Coumadin daily, recheck labs in 1 week.  Patient expressed understanding, no questions at this time.

## 2012-01-22 NOTE — Telephone Encounter (Signed)
Message copied by Tylene Fantasia on Thu Jan 22, 2012 11:22 AM ------      Message from: Victorino December      Created: Wed Jan 21, 2012  4:02 PM       Increase coumadin to 10 mg daily, recheck in 1 week      ----- Message -----         From: Tylene Fantasia, RN         Sent: 01/21/2012   2:03 PM           To: Victorino December, MD            9/10 PT 17.6      INR 1.39            Currently taking Coumadin 7.5 mg daily

## 2012-01-28 ENCOUNTER — Encounter: Payer: Self-pay | Admitting: Internal Medicine

## 2012-02-02 ENCOUNTER — Encounter: Payer: Self-pay | Admitting: Internal Medicine

## 2012-02-03 ENCOUNTER — Encounter: Payer: Self-pay | Admitting: Internal Medicine

## 2012-02-03 DIAGNOSIS — Z0279 Encounter for issue of other medical certificate: Secondary | ICD-10-CM

## 2012-02-04 ENCOUNTER — Telehealth: Payer: Self-pay

## 2012-02-04 NOTE — Telephone Encounter (Addendum)
Patient reports that she is not having any problems currently.  She is agreeable to go to [facility] to Dr. Gwenith Spitz

## 2012-02-04 NOTE — Telephone Encounter (Signed)
Message copied by Annett Fabian on Wed Feb 04, 2012 10:07 AM ------      Message from: Iva Boop      Created: Sun Feb 01, 2012 11:09 AM      Regarding: appointment at [facility]       We were going to get her an appointment at [facility] with Dr. Britt Bolognese re: short bowel, ? gattex therapy.            She has had some hospitalizations that have gotten in the way            Please call her and check in            1) any active GI problems      2) let her know we will be making appointment - make sure she is ok with that - will need recent hospital dc summaries to go with her or get faxed

## 2012-02-05 ENCOUNTER — Telehealth: Payer: Self-pay | Admitting: Medical Oncology

## 2012-02-05 ENCOUNTER — Other Ambulatory Visit (HOSPITAL_BASED_OUTPATIENT_CLINIC_OR_DEPARTMENT_OTHER): Payer: BC Managed Care – PPO | Admitting: Lab

## 2012-02-05 ENCOUNTER — Other Ambulatory Visit: Payer: Self-pay | Admitting: Medical Oncology

## 2012-02-05 ENCOUNTER — Ambulatory Visit (HOSPITAL_BASED_OUTPATIENT_CLINIC_OR_DEPARTMENT_OTHER): Payer: BC Managed Care – PPO

## 2012-02-05 VITALS — BP 128/78 | HR 60 | Temp 97.7°F

## 2012-02-05 DIAGNOSIS — D638 Anemia in other chronic diseases classified elsewhere: Secondary | ICD-10-CM

## 2012-02-05 DIAGNOSIS — D689 Coagulation defect, unspecified: Secondary | ICD-10-CM

## 2012-02-05 LAB — CBC WITH DIFFERENTIAL/PLATELET
BASO%: 0.6 % (ref 0.0–2.0)
MCHC: 32.1 g/dL (ref 31.5–36.0)
MONO#: 0.4 10*3/uL (ref 0.1–0.9)
RBC: 3.11 10*6/uL — ABNORMAL LOW (ref 3.70–5.45)
RDW: 16.5 % — ABNORMAL HIGH (ref 11.2–14.5)
WBC: 4.8 10*3/uL (ref 3.9–10.3)
lymph#: 2.3 10*3/uL (ref 0.9–3.3)
nRBC: 0 % (ref 0–0)

## 2012-02-05 LAB — PROTIME-INR

## 2012-02-05 MED ORDER — DARBEPOETIN ALFA-POLYSORBATE 300 MCG/0.6ML IJ SOLN
300.0000 ug | Freq: Once | INTRAMUSCULAR | Status: AC
  Administered 2012-02-05: 300 ug via SUBCUTANEOUS
  Filled 2012-02-05: qty 0.6

## 2012-02-05 NOTE — Telephone Encounter (Signed)
Message copied by Tylene Fantasia on Thu Feb 05, 2012  2:36 PM ------      Message from: [name] [name]      Created: Thu Feb 05, 2012  2:21 PM       Please call patient: keep same dose, recheck in 1 week

## 2012-02-05 NOTE — Telephone Encounter (Signed)
Per MD, patient to keep current dose of Coumadin and recheck labs in one week.  Patient expressed understanding, confirmed instructions. No questions at this time.

## 2012-02-06 ENCOUNTER — Encounter: Payer: Self-pay | Admitting: Internal Medicine

## 2012-02-10 ENCOUNTER — Encounter: Payer: Self-pay | Admitting: Cardiovascular Disease

## 2012-02-10 ENCOUNTER — Ambulatory Visit (INDEPENDENT_AMBULATORY_CARE_PROVIDER_SITE_OTHER): Payer: BC Managed Care – PPO | Admitting: Cardiovascular Disease

## 2012-02-10 VITALS — BP 133/73 | HR 60 | Ht 68.0 in | Wt 151.0 lb

## 2012-02-10 DIAGNOSIS — I509 Heart failure, unspecified: Secondary | ICD-10-CM

## 2012-02-10 DIAGNOSIS — I5023 Acute on chronic systolic (congestive) heart failure: Secondary | ICD-10-CM

## 2012-02-10 DIAGNOSIS — N179 Acute kidney failure, unspecified: Secondary | ICD-10-CM

## 2012-02-10 DIAGNOSIS — K912 Postsurgical malabsorption, not elsewhere classified: Secondary | ICD-10-CM

## 2012-02-10 NOTE — Assessment & Plan Note (Signed)
F/U Dr Leone Payor  Research trial [facility]  Continue TPN which requires long term coumadin for [facility] patency

## 2012-02-10 NOTE — Patient Instructions (Signed)
Your physician wants you to follow-up in:  6 MONTHS WITH DR NISHAN  You will receive a reminder letter in the mail two months in advance. If you don't receive a letter, please call our office to schedule the follow-up appointment. Your physician recommends that you continue on your current medications as directed. Please refer to the Current Medication list given to you today. 

## 2012-02-10 NOTE — Progress Notes (Signed)
Patient ID: [name] [name], female   DOB: 1950-12-27, 60 y.o.   MRN: [id number] 61 yo seen at [facility] in August for CHF.  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  CHF cleared quickly.  EF 35% echo . F/U myovue  IMI no ischemia.  EF 33%.  Patient has no chest pain.  Cath deferred due to acute renal failure and fungemia.  PIC line replaced and on chronic coumadin.  Needs lovenox bridge when stopped.  She feels fine and does not want heart cath.  Still has chronic anemia and low PLT;s  Seeing doctor in [facility] for new absorbtive drug to try to get off TPN.  No palpitations, PND orthopnea or edema.    Reviewed hospital records, echo and myovue  ROS: Denies fever, malais, weight loss, blurry vision, decreased visual acuity, cough, sputum, SOB, hemoptysis, pleuritic pain, palpitaitons, heartburn, abdominal pain, melena, lower extremity edema, claudication, or rash.  All other systems reviewed and negative  General: Affect appropriate Chronically ill female HEENT: normal Neck supple with no adenopathy JVP normal no bruits no thyromegaly Lungs clear with no wheezing and good diaphragmatic motion Heart:  S1/S2 no murmur, no rub, gallop or click PMI normal Abdomen: benighn, BS positve, no tenderness, no AAA no bruit.  No HSM or HJR Distal pulses intact with no bruits No edema Neuro non-focal Skin warm and dry No muscular weakness PIC line RUE   Current Outpatient Prescriptions  Medication Sig Dispense Refill  . ADULT TPN Inject 2,100 mLs into the vein continuous. Infuse over 10 hours with 1 hour ramp up and 1 hour ramp down. Total bag per day provides: 250g Dextrose 70%, 100g Travasol 10%, 45g Intralipids 30%, various electrolytes, MVI, TE, Zinc, selenium.      . carvedilol (COREG) 3.125 MG tablet Take 1 tablet (3.125 mg total) by mouth 2 (two) times daily with a meal.  60 tablet  1  . ciprofloxacin (CIPRO) 500 MG tablet prn      . clobetasol ointment (TEMOVATE) 0.05 % Apply 1 application topically 2  (two) times daily.  60 g  3  . diphenhydrAMINE (BENADRYL) 25 mg capsule Take 25 mg by mouth every 6 (six) hours as needed. For itching/sleep.      . Eflornithine HCl (VANIQA) 13.9 % cream Apply 1 application topically 2 (two) times daily with a meal. Applied to facial hair.      . furosemide (LASIX) 20 MG tablet Take 1 tablet (20 mg total) by mouth 2 (two) times daily.  60 tablet  1  . isosorbide dinitrate (ISORDIL) 10 MG tablet Take 1 tablet (10 mg total) by mouth 2 (two) times daily.  60 tablet  1  . Multiple Vitamin (MULTIVITAMIN WITH MINERALS) TABS Take 1 tablet by mouth daily.      Marland Kitchen omeprazole (PRILOSEC) 40 MG capsule Take 40 mg by mouth daily.      . Parenteral Electrolytes (TPN ELECTROLYTES IV) Inject 1 each into the vein at bedtime.       . potassium chloride (K-DUR,KLOR-CON) 10 MEQ tablet Take 10 mEq by mouth as needed.      . promethazine (PHENERGAN) 25 MG tablet Take 25 mg by mouth every 6 (six) hours as needed. For nausea.      . ursodiol (ACTIGALL) 300 MG capsule TAKE 1 CAPSULE BY MOUTH TWICE DAILY  60 capsule  5  . Vitamin D, Ergocalciferol, (DRISDOL) 50000 UNITS CAPS ALTERNATE TAKING 1 CAPSULE BY MOUTH EVERY OTHER DAY AND 2 CAPSULES  BY MOUTH EVERY OTHER DAY  45 capsule  1  . warfarin (COUMADIN) 1 MG tablet Take 5 tablets (5 mg total) by mouth as directed.  20 tablet  0  . DISCONTD: potassium chloride (K-DUR,KLOR-CON) 10 MEQ tablet Take 1 tablet (10 mEq total) by mouth daily.  30 tablet  0    Allergies  Iohexol and Penicillins  Electrocardiogram:  Assessment and Plan

## 2012-02-10 NOTE — Assessment & Plan Note (Signed)
Resolved.  STable on current meds.  Patient and I agree heart cath not inidcated at this point.  F/U EF by echo or MRI in 6 months

## 2012-02-10 NOTE — Assessment & Plan Note (Signed)
Resolved last Cr 1.4 from [facility]

## 2012-02-11 ENCOUNTER — Telehealth: Payer: Self-pay | Admitting: *Deleted

## 2012-02-11 ENCOUNTER — Ambulatory Visit (HOSPITAL_BASED_OUTPATIENT_CLINIC_OR_DEPARTMENT_OTHER): Payer: BC Managed Care – PPO | Admitting: Lab

## 2012-02-11 ENCOUNTER — Ambulatory Visit (HOSPITAL_BASED_OUTPATIENT_CLINIC_OR_DEPARTMENT_OTHER): Payer: BC Managed Care – PPO | Admitting: Oncology

## 2012-02-11 ENCOUNTER — Encounter: Payer: Self-pay | Admitting: Oncology

## 2012-02-11 VITALS — BP 145/80 | HR 66 | Temp 98.7°F | Resp 20 | Ht 68.0 in | Wt 151.6 lb

## 2012-02-11 DIAGNOSIS — D638 Anemia in other chronic diseases classified elsewhere: Secondary | ICD-10-CM

## 2012-02-11 DIAGNOSIS — D689 Coagulation defect, unspecified: Secondary | ICD-10-CM

## 2012-02-11 DIAGNOSIS — K912 Postsurgical malabsorption, not elsewhere classified: Secondary | ICD-10-CM

## 2012-02-11 DIAGNOSIS — D649 Anemia, unspecified: Secondary | ICD-10-CM

## 2012-02-11 LAB — CBC WITH DIFFERENTIAL/PLATELET
BASO%: 0.5 % (ref 0.0–2.0)
Basophils Absolute: 0 10*3/uL (ref 0.0–0.1)
HCT: 29.1 % — ABNORMAL LOW (ref 34.8–46.6)
HGB: 9.5 g/dL — ABNORMAL LOW (ref 11.6–15.9)
LYMPH%: 47 % (ref 14.0–49.7)
MCH: 29.6 pg (ref 25.1–34.0)
MCHC: 32.6 g/dL (ref 31.5–36.0)
MONO#: 0.5 10*3/uL (ref 0.1–0.9)
NEUT%: 36 % — ABNORMAL LOW (ref 38.4–76.8)
Platelets: 90 10*3/uL — ABNORMAL LOW (ref 145–400)
WBC: 4.3 10*3/uL (ref 3.9–10.3)
lymph#: 2 10*3/uL (ref 0.9–3.3)

## 2012-02-11 LAB — PROTIME-INR
INR: 1.6 — ABNORMAL LOW (ref 2.00–3.50)
Protime: 19.2 Seconds — ABNORMAL HIGH (ref 10.6–13.4)

## 2012-02-11 LAB — COMPREHENSIVE METABOLIC PANEL (CC13)
ALT: 29 U/L (ref 0–55)
AST: 27 U/L (ref 5–34)
Alkaline Phosphatase: 126 U/L (ref 40–150)
Creatinine: 1.5 mg/dL — ABNORMAL HIGH (ref 0.6–1.1)
Sodium: 135 mEq/L — ABNORMAL LOW (ref 136–145)
Total Bilirubin: 0.4 mg/dL (ref 0.20–1.20)

## 2012-02-11 NOTE — Progress Notes (Signed)
OFFICE PROGRESS NOTE  CC  [name] Rhymes, MD 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 Do to malabsorption.  #2 anemia of chronic disease. Patient is on Aranesp injections every 3 weeks.  #3 history of thrombosis on Coumadin to try to keep her INR between 2 and 2.5.  CURRENT THERAPY: Aranesp injections and anticoagulation with Coumadin  INTERVAL HISTORY: [name] [name] 61 y.o. female returns for followup visit today. Clinically she seems to be doing well she was recently hospitalized [name] [name] [facility] for what sounds like CHF with on renal failure due to fluid overload. She has now recovered. Her CBC is back to her baseline. She denies any fevers chills night sweats headaches shortness of breath chest pains palpitations she does have some weakness. She has no bleeding problems. She has proceeded to go back to getting her Aranesp injections she received an injection this past week. She is also getting her INRs jack. Today her INR was only 1.6 she is on 10 mg of Coumadin I have asked her to increase her Coumadin 10 mg alternating with 12.5 mg. She has no bleeding problems. Remainder of the template review of systems is unremarkable.  MEDICAL HISTORY: Past Medical History  Diagnosis Date  . Short bowel syndrome   . Abnormal LFTs   . Vitamin d deficiency   . Personal history of colonic polyps   . Thrombophilia   . Diabetes mellitus     resolved after weight loss  . Obesity     prior to weight loss  . Allergic rhinosinusitis   . At risk for dental problems   . Fracture of left clavicle   . Osteoporosis   . Anemia of chronic disease   . Renal insufficiency   . Atypical nevus   . Pathologic fracture of neck of femur   . Pancytopenia 10/07/2011  . Small bowel ischemia   . Superior mesenteric artery stenosis   . Bacterial  overgrowth syndrome     ALLERGIES:  is allergic to iohexol and penicillins.  MEDICATIONS:  Current Outpatient Prescriptions  Medication Sig Dispense Refill  . ADULT TPN Inject 2,100 mLs into the vein continuous. Infuse over 10 hours with 1 hour ramp up and 1 hour ramp down. Total bag per day provides: 250g Dextrose 70%, 100g Travasol 10%, 45g Intralipids 30%, various electrolytes, MVI, TE, Zinc, selenium.      . carvedilol (COREG) 3.125 MG tablet Take 1 tablet (3.125 mg total) by mouth 2 (two) times daily with a meal.  60 tablet  1  . ciprofloxacin (CIPRO) 500 MG tablet prn      . clobetasol ointment (TEMOVATE) 0.05 % Apply 1 application topically 2 (two) times daily.  60 g  3  . diphenhydrAMINE (BENADRYL) 25 mg capsule Take 25 mg by mouth every 6 (six) hours as needed. For itching/sleep.      . Eflornithine HCl (VANIQA) 13.9 % cream Apply 1 application topically 2 (two) times daily with a meal. Applied to facial hair.      . furosemide (LASIX) 20 MG tablet Take 1 tablet (20 mg total) by mouth 2 (two) times daily.  60 tablet  1  . isosorbide dinitrate (ISORDIL) 10 MG tablet Take 1 tablet (10 mg total) by mouth 2 (two) times daily.  60 tablet  1  . Multiple Vitamin (MULTIVITAMIN WITH MINERALS)  TABS Take 1 tablet by mouth daily.      Marland Kitchen omeprazole (PRILOSEC) 40 MG capsule Take 40 mg by mouth daily.      . Parenteral Electrolytes (TPN ELECTROLYTES IV) Inject 1 each into the vein at bedtime.       . potassium chloride (K-DUR,KLOR-CON) 10 MEQ tablet Take 10 mEq by mouth as needed.      . promethazine (PHENERGAN) 25 MG tablet Take 25 mg by mouth every 6 (six) hours as needed. For nausea.      . ursodiol (ACTIGALL) 300 MG capsule TAKE 1 CAPSULE BY MOUTH TWICE DAILY  60 capsule  5  . Vitamin D, Ergocalciferol, (DRISDOL) 50000 UNITS CAPS ALTERNATE TAKING 1 CAPSULE BY MOUTH EVERY OTHER DAY AND 2 CAPSULES BY MOUTH EVERY OTHER DAY  45 capsule  1  . warfarin (COUMADIN) 1 MG tablet Take 5 tablets (5  mg total) by mouth as directed.  20 tablet  0    SURGICAL HISTORY:  Past Surgical History  Procedure Date  . Small intestine surgery 2005    multiple with right colon resection for ischemia/infarct  . Cholecystectomy   . Orif proximal femoral fracture w/ itst nail system 03/2007    left, Dr. Simonne Come  . Esophagogastroduodenoscopy 01/22/2009    erosive esophagitis  . Colonoscopy 12/05/2005    internal hemorrhoids (for polyp surveillance)    REVIEW OF SYSTEMS:  Pertinent items are noted in HPI.     PHYSICAL EXAMINATION: Blood pressure 145/80, pulse 66, temperature 98.7 F (37.1 C), temperature source Oral, resp. rate 20, height 5\' 8"  (1.727 m), weight 151 lb 9.6 oz (68.765 kg). Body mass index is 23.05 kg/(m^2).  ECOG PERFORMANCE STATUS: 1 - Symptomatic but completely ambulatory   HEENT exam patient is edentulous EOMI PERRLA sclerae anicteric no conjunctival pallor oral mucosa is moist no thrush neck is supple no palpable adenopathy lungs are clear cardiovascular is regular rate rhythm abdomen soft nontender no HSM extremities no edema neuro is nonfocal  LABORATORY DATA: Lab Results  Component Value Date   WBC 4.8 02/05/2012   HGB 9.0* 02/05/2012   HCT 28.0* 02/05/2012   MCV 90.0 02/05/2012   PLT 104* 02/05/2012      Chemistry      Component Value Date/Time   NA 132* 12/27/2011 0538   NA 140 03/01/2009 1029   K 4.1 12/27/2011 0538   K 4.2 03/01/2009 1029   CL 97 12/27/2011 0538   CL 104 03/01/2009 1029   CO2 26 12/27/2011 0538   CO2 25 03/01/2009 1029   BUN 40* 12/27/2011 0538   BUN 17 03/01/2009 1029   CREATININE 2.74* 12/27/2011 0538   CREATININE 0.5* 03/01/2009 1029      Component Value Date/Time   CALCIUM 8.4 12/27/2011 0538   CALCIUM 8.7 03/01/2009 1029   ALKPHOS 148* 12/27/2011 0538   ALKPHOS 143* 03/01/2009 1029   AST 40* 12/27/2011 0538   AST 46* 03/01/2009 1029   ALT 25 12/27/2011 0538   BILITOT 0.4 12/27/2011 0538   BILITOT 0.40 03/01/2009 1029        RADIOGRAPHIC STUDIES:  No results found.  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  #2 anemia of chronic disease secondary to malabsorption and multiple medical problems she is on Aranesp injections overall tolerating it well.  #3 from the cytopenia likely multifactorial we are observing only.  #4 thrombosis on Coumadin to try to keep an INR between 2 and 2.5  PLAN:   #1 patient will continue to get Aranesp injections every 3 weeks.  #2 Coumadin is being monitored I have increased her dose to 10 alternating with 12.5 and she will have a recheck in one week's time.  #3 I will plan on seeing her back in one month's time for followup.   All questions were answered. The patient knows to call the clinic with any problems, questions or concerns. We can certainly see the patient much sooner if necessary.  I spent 25 minutes counseling the patient face to face. The total time spent in the appointment was 30 minutes.    Drue Second, MD Medical/Oncology [facility] [phone] (beeper) [phone] (Office)  02/11/2012, 4:13 PM

## 2012-02-11 NOTE — Telephone Encounter (Signed)
walk in lab appt now 10/2

## 2012-02-11 NOTE — Patient Instructions (Addendum)
Increase coumadin to 10 mg alternate with 12 1/2 mg repeat PT/INR in 1 week  Continue aranesp injections q 3 weeks  I will see you back in 1 month for follow up

## 2012-02-12 ENCOUNTER — Telehealth: Payer: Self-pay | Admitting: *Deleted

## 2012-02-12 NOTE — Telephone Encounter (Signed)
Gave patient appointment for 04-02-2012 lab and md starting 8:30am

## 2012-02-18 ENCOUNTER — Telehealth: Payer: Self-pay | Admitting: Oncology

## 2012-02-18 ENCOUNTER — Other Ambulatory Visit: Payer: Self-pay | Admitting: *Deleted

## 2012-02-18 ENCOUNTER — Other Ambulatory Visit (HOSPITAL_BASED_OUTPATIENT_CLINIC_OR_DEPARTMENT_OTHER): Payer: BC Managed Care – PPO | Admitting: Lab

## 2012-02-18 ENCOUNTER — Telehealth: Payer: Self-pay | Admitting: *Deleted

## 2012-02-18 DIAGNOSIS — D649 Anemia, unspecified: Secondary | ICD-10-CM

## 2012-02-18 LAB — CBC WITH DIFFERENTIAL/PLATELET
BASO%: 1 % (ref 0.0–2.0)
HCT: 31.7 % — ABNORMAL LOW (ref 34.8–46.6)
LYMPH%: 45.5 % (ref 14.0–49.7)
MCH: 29.7 pg (ref 25.1–34.0)
MCHC: 32.2 g/dL (ref 31.5–36.0)
MCV: 92.2 fL (ref 79.5–101.0)
MONO#: 0.5 10*3/uL (ref 0.1–0.9)
MONO%: 10.4 % (ref 0.0–14.0)
NEUT%: 36.2 % — ABNORMAL LOW (ref 38.4–76.8)
Platelets: 108 10*3/uL — ABNORMAL LOW (ref 145–400)
WBC: 4.9 10*3/uL (ref 3.9–10.3)

## 2012-02-18 LAB — PROTIME-INR

## 2012-02-18 MED ORDER — WARFARIN SODIUM 1 MG PO TABS: ORAL_TABLET | ORAL | Status: DC

## 2012-02-18 NOTE — Telephone Encounter (Signed)
S/w the pt and she is aware of her lab appt on 02/25/2012

## 2012-02-18 NOTE — Telephone Encounter (Signed)
Per MD, notified pt to continue same dose coumadn 10mg /alternating 12.5 daily. Recheck labs in 1 week. Pt advised she does not have a lab appt until 10/24. And will need refill on Coumadin 1mg  tablets as she ran out last night. Pt confirmed she has 10mg  tablets and declined refill.  Onc tx sent for lab appt 10/16 0930. Pt verbalized understanding.

## 2012-02-18 NOTE — Telephone Encounter (Signed)
Message copied by Jeryn Bertoni, Gerald Leitz on Wed Feb 18, 2012 11:09 AM ------      Message from: [name] [name]      Created: Wed Feb 18, 2012 10:20 AM       Call patient: keep same dose of coumadin check in 1 week

## 2012-02-18 NOTE — Telephone Encounter (Signed)
S/w the pt and she is aware of her lab appt on 02/25/2012 °

## 2012-02-19 ENCOUNTER — Ambulatory Visit (INDEPENDENT_AMBULATORY_CARE_PROVIDER_SITE_OTHER): Payer: BC Managed Care – PPO

## 2012-02-19 DIAGNOSIS — Z23 Encounter for immunization: Secondary | ICD-10-CM

## 2012-02-24 ENCOUNTER — Encounter: Payer: Self-pay | Admitting: Internal Medicine

## 2012-02-25 ENCOUNTER — Other Ambulatory Visit: Payer: Self-pay

## 2012-02-25 ENCOUNTER — Other Ambulatory Visit (HOSPITAL_BASED_OUTPATIENT_CLINIC_OR_DEPARTMENT_OTHER): Payer: BC Managed Care – PPO | Admitting: Lab

## 2012-02-25 ENCOUNTER — Other Ambulatory Visit: Payer: Self-pay | Admitting: Family Medicine

## 2012-02-25 ENCOUNTER — Other Ambulatory Visit: Payer: Self-pay | Admitting: Adult Health

## 2012-02-25 DIAGNOSIS — D6859 Other primary thrombophilia: Secondary | ICD-10-CM

## 2012-02-25 DIAGNOSIS — D638 Anemia in other chronic diseases classified elsewhere: Secondary | ICD-10-CM

## 2012-02-25 LAB — CBC WITH DIFFERENTIAL/PLATELET
BASO%: 0.6 % (ref 0.0–2.0)
Basophils Absolute: 0 10*3/uL (ref 0.0–0.1)
EOS%: 9.5 % — ABNORMAL HIGH (ref 0.0–7.0)
Eosinophils Absolute: 0.5 10*3/uL (ref 0.0–0.5)
HCT: 32.6 % — ABNORMAL LOW (ref 34.8–46.6)
HGB: 10.6 g/dL — ABNORMAL LOW (ref 11.6–15.9)
LYMPH%: 44.4 % (ref 14.0–49.7)
MCH: 30.3 pg (ref 25.1–34.0)
MCHC: 32.5 g/dL (ref 31.5–36.0)
MCV: 93.1 fL (ref 79.5–101.0)
MONO#: 0.5 10*3/uL (ref 0.1–0.9)
MONO%: 9.9 % (ref 0.0–14.0)
NEUT#: 1.7 10*3/uL (ref 1.5–6.5)
NEUT%: 35.6 % — ABNORMAL LOW (ref 38.4–76.8)
Platelets: 119 10*3/uL — ABNORMAL LOW (ref 145–400)
RBC: 3.5 10*6/uL — ABNORMAL LOW (ref 3.70–5.45)
RDW: 16.6 % — ABNORMAL HIGH (ref 11.2–14.5)
WBC: 4.8 10*3/uL (ref 3.9–10.3)
lymph#: 2.1 10*3/uL (ref 0.9–3.3)
nRBC: 0 % (ref 0–0)

## 2012-02-25 LAB — PROTIME-INR: INR: 1.6 — ABNORMAL LOW (ref 2.00–3.50)

## 2012-02-25 MED ORDER — VITAMIN D (ERGOCALCIFEROL) 1.25 MG (50000 UNIT) PO CAPS: ORAL_CAPSULE | ORAL | Status: DC

## 2012-02-25 NOTE — Telephone Encounter (Signed)
Pt states requested wrong Rx will check and call back.   MW

## 2012-02-25 NOTE — Telephone Encounter (Signed)
refill furosemide 20mg  tablets TK ONE T PO BID  #60 last fill 10.3.13--last ov 8.23.13 Hospt.f/u

## 2012-02-26 ENCOUNTER — Encounter: Payer: Self-pay | Admitting: Internal Medicine

## 2012-03-01 ENCOUNTER — Other Ambulatory Visit: Payer: Self-pay | Admitting: Oncology

## 2012-03-01 DIAGNOSIS — D6859 Other primary thrombophilia: Secondary | ICD-10-CM

## 2012-03-02 ENCOUNTER — Telehealth: Payer: Self-pay | Admitting: *Deleted

## 2012-03-02 NOTE — Telephone Encounter (Signed)
Patient confirmed over the phone the new date and time of the weekly labs orders

## 2012-03-03 ENCOUNTER — Other Ambulatory Visit: Payer: Self-pay | Admitting: Family Medicine

## 2012-03-03 MED ORDER — ISOSORBIDE DINITRATE 10 MG PO TABS: 10.0000 mg | ORAL_TABLET | Freq: Two times a day (BID) | ORAL | Status: DC

## 2012-03-03 MED ORDER — CARVEDILOL 3.125 MG PO TABS: 3.1250 mg | ORAL_TABLET | Freq: Two times a day (BID) | ORAL | Status: DC

## 2012-03-03 NOTE — Telephone Encounter (Signed)
refill x 2 last ov 8.23.13 Hospital f/u    1-Carvedilol 3.125MG  Tablets #60 TK 1 PO BID WITH A MEAL --last fill 10.17.13  2-Isosorbide Dinitrate 10MG  ORal Tabs #60 TK1 T PO BID -- last fill 10.17.13

## 2012-03-03 NOTE — Telephone Encounter (Signed)
rx sent to pharmacy by e-script  

## 2012-03-04 ENCOUNTER — Ambulatory Visit (HOSPITAL_BASED_OUTPATIENT_CLINIC_OR_DEPARTMENT_OTHER): Payer: BC Managed Care – PPO

## 2012-03-04 ENCOUNTER — Telehealth: Payer: Self-pay | Admitting: *Deleted

## 2012-03-04 ENCOUNTER — Other Ambulatory Visit: Payer: BC Managed Care – PPO

## 2012-03-04 ENCOUNTER — Other Ambulatory Visit (HOSPITAL_BASED_OUTPATIENT_CLINIC_OR_DEPARTMENT_OTHER): Payer: BC Managed Care – PPO | Admitting: Lab

## 2012-03-04 VITALS — BP 130/76 | HR 64 | Temp 97.0°F

## 2012-03-04 DIAGNOSIS — D638 Anemia in other chronic diseases classified elsewhere: Secondary | ICD-10-CM

## 2012-03-04 DIAGNOSIS — E119 Type 2 diabetes mellitus without complications: Secondary | ICD-10-CM

## 2012-03-04 DIAGNOSIS — D6859 Other primary thrombophilia: Secondary | ICD-10-CM

## 2012-03-04 LAB — PROTIME-INR
INR: 2.1 (ref 2.00–3.50)
Protime: 25.2 Seconds — ABNORMAL HIGH (ref 10.6–13.4)

## 2012-03-04 LAB — CBC WITH DIFFERENTIAL/PLATELET
BASO%: 0.5 % (ref 0.0–2.0)
EOS%: 9 % — ABNORMAL HIGH (ref 0.0–7.0)
HCT: 32.1 % — ABNORMAL LOW (ref 34.8–46.6)
MCH: 30.3 pg (ref 25.1–34.0)
MCHC: 32.7 g/dL (ref 31.5–36.0)
MONO#: 0.4 10*3/uL (ref 0.1–0.9)
RBC: 3.47 10*6/uL — ABNORMAL LOW (ref 3.70–5.45)
RDW: 15.6 % — ABNORMAL HIGH (ref 11.2–14.5)
WBC: 4.4 10*3/uL (ref 3.9–10.3)
lymph#: 2.2 10*3/uL (ref 0.9–3.3)

## 2012-03-04 MED ORDER — DARBEPOETIN ALFA-POLYSORBATE 300 MCG/0.6ML IJ SOLN
300.0000 ug | Freq: Once | INTRAMUSCULAR | Status: AC
  Administered 2012-03-04: 300 ug via SUBCUTANEOUS
  Filled 2012-03-04: qty 0.6

## 2012-03-04 NOTE — Telephone Encounter (Signed)
Per  NP, notified pt to continue current dose Coumadin and recheck labs in 1 week. Pt currently taking 10mg  M/W/F alternating 12mg  other days. Pt verbalized understanding

## 2012-03-04 NOTE — Telephone Encounter (Signed)
Message copied by Cooper Render on Thu Mar 04, 2012  9:12 AM ------      Message from: Laural Golden      Created: Thu Mar 04, 2012  9:02 AM       Please call patient.  Patient to continue current dose of Coumadin.  Re-check in 1 week.              ----- Message -----         From: Lab In Three Zero One Interface         Sent: 03/04/2012   8:19 AM           To: Victorino December, MD

## 2012-03-09 ENCOUNTER — Other Ambulatory Visit: Payer: Self-pay | Admitting: Oncology

## 2012-03-11 ENCOUNTER — Other Ambulatory Visit (HOSPITAL_BASED_OUTPATIENT_CLINIC_OR_DEPARTMENT_OTHER): Payer: BC Managed Care – PPO | Admitting: Lab

## 2012-03-11 DIAGNOSIS — D6859 Other primary thrombophilia: Secondary | ICD-10-CM

## 2012-03-11 LAB — PROTIME-INR: INR: 2 (ref 2.00–3.50)

## 2012-03-12 ENCOUNTER — Telehealth: Payer: Self-pay | Admitting: Adult Health

## 2012-03-12 NOTE — Telephone Encounter (Signed)
Called to talk to patient regarding coumadin dose, however patient did not answer and does not have voice mail.  Will try calling later.

## 2012-03-15 ENCOUNTER — Encounter: Payer: Self-pay | Admitting: Internal Medicine

## 2012-03-18 ENCOUNTER — Telehealth: Payer: Self-pay | Admitting: *Deleted

## 2012-03-18 ENCOUNTER — Other Ambulatory Visit (HOSPITAL_BASED_OUTPATIENT_CLINIC_OR_DEPARTMENT_OTHER): Payer: BC Managed Care – PPO | Admitting: Lab

## 2012-03-18 DIAGNOSIS — D6859 Other primary thrombophilia: Secondary | ICD-10-CM

## 2012-03-18 LAB — PROTIME-INR: INR: 2.1 (ref 2.00–3.50)

## 2012-03-18 NOTE — Telephone Encounter (Signed)
Message copied by Cooper Render on Thu Mar 18, 2012 11:18 AM ------      Message from: Augustin Schooling C      Created: Thu Mar 18, 2012 11:08 AM       Patient to continue current dose of coumadin.              ----- Message -----         From: Lab In Three Zero One Interface         Sent: 03/18/2012   9:49 AM           To: Victorino December, MD

## 2012-03-18 NOTE — Telephone Encounter (Signed)
Per NP, Notified pt to continue Coumadin 10mg   M/W/F 12mg  other days. Recheck labs in 1 week. Pt verbalized understanding.

## 2012-03-22 ENCOUNTER — Ambulatory Visit (INDEPENDENT_AMBULATORY_CARE_PROVIDER_SITE_OTHER): Payer: BC Managed Care – PPO | Admitting: Family Medicine

## 2012-03-22 VITALS — BP 98/52 | HR 72 | Temp 97.9°F | Wt 148.0 lb

## 2012-03-22 DIAGNOSIS — A088 Other specified intestinal infections: Secondary | ICD-10-CM

## 2012-03-22 DIAGNOSIS — B001 Herpesviral vesicular dermatitis: Secondary | ICD-10-CM

## 2012-03-22 DIAGNOSIS — A084 Viral intestinal infection, unspecified: Secondary | ICD-10-CM | POA: Insufficient documentation

## 2012-03-22 DIAGNOSIS — B009 Herpesviral infection, unspecified: Secondary | ICD-10-CM

## 2012-03-22 MED ORDER — VALACYCLOVIR HCL 1 G PO TABS: 1000.0000 mg | ORAL_TABLET | Freq: Two times a day (BID) | ORAL | Status: DC

## 2012-03-22 NOTE — Progress Notes (Signed)
  Subjective:    Patient ID: [name] [name], female    DOB: 1950-12-16, 61 y.o.   MRN: [id number]  HPI Thursday went to get labs done at [facility]- had swelling of R wrist, L knee and L ankle.  Later that day had nausea, vomiting, and diarrhea.  Friday felt somewhat better.  Saturday woke up and mouth was sore and 'my lips were lit up like a christmas tree'.  Today still having diarrhea but 'lips are driving me nuts'.  Using Abreva.  Diarrhea is slowing but she is not eating much.  Trying to push fluids.   Review of Systems For ROS see HPI     Objective:   Physical Exam  Vitals reviewed. Constitutional: She is oriented to person, place, and time. She appears well-developed and well-nourished. No distress.  HENT:  Head: Normocephalic and atraumatic.       MMM 3 small cold sores noted on vermillion border- 2 upper lip, 1 lower lip  Neck: Neck supple.  Cardiovascular: Normal rate, regular rhythm and intact distal pulses.   Pulmonary/Chest: Effort normal and breath sounds normal. No respiratory distress. She has no wheezes. She has no rales.  Abdominal: Soft. She exhibits no distension. There is no tenderness. There is no rebound.       Hyperactive BS  Lymphadenopathy:    She has no cervical adenopathy.  Neurological: She is alert and oriented to person, place, and time.  Skin: Skin is warm and dry.          Assessment & Plan:

## 2012-03-22 NOTE — Assessment & Plan Note (Signed)
New.  sxs are already improving- no longer having nausea or vomiting.  Still w/ some loose stools.  No evidence of dehydration.  Encouraged her to increase her fluid intake.  Reviewed supportive care and red flags that should prompt return.  Pt expressed understanding and is in agreement w/ plan.

## 2012-03-22 NOTE — Assessment & Plan Note (Signed)
New.  Start valtrex.  Continue abreva prn.  Reviewed supportive care and red flags that should prompt return.  Pt expressed understanding and is in agreement w/ plan.

## 2012-03-22 NOTE — Patient Instructions (Addendum)
Start the Valtrex, 2 doses 12 hrs apart Vasoline to the lips but try not to lick! Continue to drink plenty of fluids Call if symptoms change or worsen Hang in there!!!

## 2012-03-23 ENCOUNTER — Other Ambulatory Visit: Payer: Self-pay | Admitting: Oncology

## 2012-03-25 ENCOUNTER — Other Ambulatory Visit (HOSPITAL_BASED_OUTPATIENT_CLINIC_OR_DEPARTMENT_OTHER): Payer: BC Managed Care – PPO | Admitting: Lab

## 2012-03-25 DIAGNOSIS — D6859 Other primary thrombophilia: Secondary | ICD-10-CM

## 2012-03-25 LAB — PROTIME-INR

## 2012-03-30 ENCOUNTER — Encounter: Payer: Self-pay | Admitting: Internal Medicine

## 2012-03-30 ENCOUNTER — Other Ambulatory Visit: Payer: Self-pay | Admitting: Internal Medicine

## 2012-03-30 ENCOUNTER — Other Ambulatory Visit: Payer: Self-pay | Admitting: Family Medicine

## 2012-03-31 NOTE — Telephone Encounter (Signed)
Rx sent.    MW 

## 2012-04-01 ENCOUNTER — Ambulatory Visit: Payer: BC Managed Care – PPO

## 2012-04-01 ENCOUNTER — Other Ambulatory Visit (HOSPITAL_BASED_OUTPATIENT_CLINIC_OR_DEPARTMENT_OTHER): Payer: BC Managed Care – PPO

## 2012-04-01 DIAGNOSIS — D638 Anemia in other chronic diseases classified elsewhere: Secondary | ICD-10-CM

## 2012-04-01 DIAGNOSIS — D649 Anemia, unspecified: Secondary | ICD-10-CM

## 2012-04-01 DIAGNOSIS — D6859 Other primary thrombophilia: Secondary | ICD-10-CM

## 2012-04-01 LAB — CBC WITH DIFFERENTIAL/PLATELET
BASO%: 1 % (ref 0.0–2.0)
EOS%: 5.7 % (ref 0.0–7.0)
MCH: 32.1 pg (ref 25.1–34.0)
MCV: 94.6 fL (ref 79.5–101.0)
MONO%: 6.4 % (ref 0.0–14.0)
RBC: 3.46 10*6/uL — ABNORMAL LOW (ref 3.70–5.45)
RDW: 14.8 % — ABNORMAL HIGH (ref 11.2–14.5)
lymph#: 2.1 10*3/uL (ref 0.9–3.3)

## 2012-04-01 LAB — PROTIME-INR
INR: 2 (ref 2.00–3.50)
Protime: 24 Seconds — ABNORMAL HIGH (ref 10.6–13.4)

## 2012-04-01 LAB — COMPREHENSIVE METABOLIC PANEL (CC13)
Albumin: 3 g/dL — ABNORMAL LOW (ref 3.5–5.0)
Alkaline Phosphatase: 97 U/L (ref 40–150)
BUN: 31 mg/dL — ABNORMAL HIGH (ref 7.0–26.0)
Creatinine: 1.3 mg/dL — ABNORMAL HIGH (ref 0.6–1.1)
Glucose: 104 mg/dl — ABNORMAL HIGH (ref 70–99)
Total Bilirubin: 0.43 mg/dL (ref 0.20–1.20)

## 2012-04-01 LAB — FERRITIN: Ferritin: 341 ng/mL — ABNORMAL HIGH (ref 10–291)

## 2012-04-01 MED ORDER — DARBEPOETIN ALFA-POLYSORBATE 500 MCG/ML IJ SOLN: 300.0000 ug | Freq: Once | INTRAMUSCULAR | Status: DC

## 2012-04-02 ENCOUNTER — Ambulatory Visit: Payer: BC Managed Care – PPO

## 2012-04-02 ENCOUNTER — Other Ambulatory Visit: Payer: BC Managed Care – PPO | Admitting: Lab

## 2012-04-02 ENCOUNTER — Ambulatory Visit: Payer: BC Managed Care – PPO | Admitting: Oncology

## 2012-04-14 ENCOUNTER — Other Ambulatory Visit: Payer: Self-pay | Admitting: Oncology

## 2012-04-15 ENCOUNTER — Telehealth: Payer: Self-pay | Admitting: Medical Oncology

## 2012-04-15 ENCOUNTER — Other Ambulatory Visit (HOSPITAL_BASED_OUTPATIENT_CLINIC_OR_DEPARTMENT_OTHER): Payer: BC Managed Care – PPO

## 2012-04-15 DIAGNOSIS — D6859 Other primary thrombophilia: Secondary | ICD-10-CM

## 2012-04-15 LAB — PROTIME-INR: INR: 1.8 — ABNORMAL LOW (ref 2.00–3.50)

## 2012-04-15 NOTE — Telephone Encounter (Signed)
Per Augustin Schooling NP, called patient to inform her to "take Coumadin 10 mg Monday Friday, 12 mg other days." Had patient verbally read back to me new insturctions. Patient verbally expressed understanding, all questions answered.

## 2012-04-18 ENCOUNTER — Encounter: Payer: Self-pay | Admitting: Internal Medicine

## 2012-04-18 DIAGNOSIS — Z86010 Personal history of colonic polyps: Secondary | ICD-10-CM | POA: Insufficient documentation

## 2012-04-19 NOTE — Progress Notes (Signed)
Patient ID: [name] [name], female   DOB: 1950-06-16, 61 y.o.   MRN: [id number] Faxed signed plan of treatment papers to [facility] , Fax# [phone]

## 2012-04-20 ENCOUNTER — Telehealth: Payer: Self-pay | Admitting: Internal Medicine

## 2012-04-20 ENCOUNTER — Telehealth: Payer: Self-pay | Admitting: Medical Oncology

## 2012-04-20 ENCOUNTER — Telehealth: Payer: Self-pay | Admitting: *Deleted

## 2012-04-20 DIAGNOSIS — T80219A Unspecified infection due to central venous catheter, initial encounter: Secondary | ICD-10-CM

## 2012-04-20 MED ORDER — ENOXAPARIN SODIUM 100 MG/ML ~~LOC~~ SOLN: 1.5000 mg/kg | Freq: Every day | SUBCUTANEOUS | Status: DC

## 2012-04-20 NOTE — Telephone Encounter (Signed)
[name] [name] with [facility] /[phone] called.  They would like to do colonoscopy this Friday.  Pt is on coumadin.  Could they take her off coumadin today for Friday's colonoscopy?   Would she need lovenox bridge?  Please return call to [facility]. Note given to [facility] @ Dr. Milta Deiters

## 2012-04-20 NOTE — Telephone Encounter (Signed)
Sherry from [facility] called, [phone], to report that patient's colonoscopy has been moved from 04/23/12  to 04/26/12, with change in date, when should coumadin be stopped? Patient is also scheduled for INR check on 12/12 @ 0945 "do we need to take her off that appt?" Will review with MD.

## 2012-04-20 NOTE — Telephone Encounter (Signed)
Colon is rescheduled to 04/26/12 07:30 at [facility].  Mira from Dr. Welton Flakes will call me back to clarify the order for coumadin.  She will come for pre-visit on 04/22/12 2:00

## 2012-04-20 NOTE — Telephone Encounter (Signed)
Per Hale Drone at Dr. Milta Deiters Patient to stop coumadin today start lovenox bridge on Monday night and they will send in RX.  Micaella aware.  She will call back for any questions

## 2012-04-20 NOTE — Telephone Encounter (Signed)
Patient reports that after flushing her PICC line or with blood draws she has chills and shaking. No fever, but feels that needs to be changed.  I have arranged for her to have a PICC exchange at [facility] IR tomorrow at 12:00. Arrive at 11:30 in radiology.  Patient is tentatively scheduled for colonoscopy for 04/23/12 in [facility].  I have left an urgent message with Dr. Milta Deiters office for direction on coumadin and possible lovenox bridge.  Will arrange pre-visit based on Dr. Milta Deiters response.

## 2012-04-20 NOTE — Telephone Encounter (Signed)
Spoke with Cordelia Pen at [facility], Per MD, patient is to stop coumadin today and begin Lovenox 1.5mg /kg daily injection the evening after the procedure (04/26/12). Lovenox prescription sent to patient's pharmacy.

## 2012-04-21 ENCOUNTER — Telehealth: Payer: Self-pay | Admitting: Internal Medicine

## 2012-04-21 ENCOUNTER — Telehealth: Payer: Self-pay | Admitting: *Deleted

## 2012-04-21 ENCOUNTER — Ambulatory Visit (HOSPITAL_COMMUNITY)
Admission: RE | Admit: 2012-04-21 | Discharge: 2012-04-21 | Disposition: A | Discharge status: Home / Self Care | Payer: BC Managed Care – PPO | Source: Ambulatory Visit | Attending: Internal Medicine | Admitting: Internal Medicine

## 2012-04-21 ENCOUNTER — Other Ambulatory Visit: Payer: Self-pay | Admitting: Internal Medicine

## 2012-04-21 ENCOUNTER — Encounter: Payer: Self-pay | Admitting: Medical Oncology

## 2012-04-21 DIAGNOSIS — T80219A Unspecified infection due to central venous catheter, initial encounter: Secondary | ICD-10-CM

## 2012-04-21 DIAGNOSIS — Z452 Encounter for adjustment and management of vascular access device: Secondary | ICD-10-CM | POA: Insufficient documentation

## 2012-04-21 MED ORDER — CHLORHEXIDINE GLUCONATE 4 % EX LIQD
CUTANEOUS | Status: AC
  Filled 2012-04-21: qty 30

## 2012-04-21 NOTE — Procedures (Signed)
Successful replacement of right arm approach 35 cm dual lumen PICC line with tip at the superior caval-atrial junction.  The PICC line is ready for immediate use.

## 2012-04-21 NOTE — Telephone Encounter (Signed)
Called patient no answering machine pateint will be in IR 04-21-2012 (today) called [phone] IR at [facility] cone spoke to a Thayer Ohm he stated he would give the patient the message to her appointment was changed to 04-23-2012 at 9:45am lab only

## 2012-04-21 NOTE — Telephone Encounter (Signed)
I spoke with [name] [name], she was contacted by the above company [employer] they are the ones that will provide the Gattex to her.  They are wanting to also provide her TPN.  They are requesting medical information.  [name] [name] has not made any decisions about who she wants to provide the TPN.  She is asked to speak with Dr. Hassan Rowan office for guidance on that.  Dr. Leone Payor have you and he communicated about plans for nutrition management once the Gatex is started?  Will he assume care and orders or you?  Sparkle wants me to hold off sending any information to this company until she can gather more information and make an informed decision.  I did speak with Gae Gallop from [employer] with her permission and advised her that once Gavina directed me to and we had a release we are happy to provide any needed information.

## 2012-04-21 NOTE — Telephone Encounter (Signed)
Follow-up call to patient to ensure she received message to start Lovenox injection the evening after her procedure (04/26/12) as instructed by MD as well as to confirm lab appt sched 12/13. Patient confirmed and verbalized understanding. No questions at this time.

## 2012-04-22 ENCOUNTER — Other Ambulatory Visit: Payer: BC Managed Care – PPO | Admitting: Lab

## 2012-04-22 ENCOUNTER — Encounter: Payer: Self-pay | Admitting: Internal Medicine

## 2012-04-22 ENCOUNTER — Ambulatory Visit (AMBULATORY_SURGERY_CENTER): Payer: BC Managed Care – PPO | Admitting: *Deleted

## 2012-04-22 VITALS — Ht 68.0 in | Wt 152.8 lb

## 2012-04-22 DIAGNOSIS — Z1211 Encounter for screening for malignant neoplasm of colon: Secondary | ICD-10-CM

## 2012-04-22 MED ORDER — MOVIPREP 100 G PO SOLR: ORAL | Status: DC

## 2012-04-22 NOTE — Telephone Encounter (Signed)
Patient advised.

## 2012-04-22 NOTE — Telephone Encounter (Signed)
Not sure how this will work - am willing to help but would help out however - would defer to [facility] to give Dr. Gwenith Spitz my cell # to call if needed

## 2012-04-23 ENCOUNTER — Other Ambulatory Visit (HOSPITAL_BASED_OUTPATIENT_CLINIC_OR_DEPARTMENT_OTHER): Payer: BC Managed Care – PPO | Admitting: Lab

## 2012-04-23 ENCOUNTER — Encounter: Payer: BC Managed Care – PPO | Admitting: Internal Medicine

## 2012-04-23 DIAGNOSIS — D6859 Other primary thrombophilia: Secondary | ICD-10-CM

## 2012-04-23 LAB — PROTIME-INR
INR: 1.3 — ABNORMAL LOW (ref 2.00–3.50)
Protime: 15.6 Seconds — ABNORMAL HIGH (ref 10.6–13.4)

## 2012-04-26 ENCOUNTER — Encounter (HOSPITAL_COMMUNITY)
Admission: RE | Disposition: A | Discharge status: Home / Self Care | Payer: Self-pay | Source: Ambulatory Visit | Attending: Internal Medicine

## 2012-04-26 ENCOUNTER — Telehealth: Payer: Self-pay | Admitting: Internal Medicine

## 2012-04-26 ENCOUNTER — Ambulatory Visit (HOSPITAL_COMMUNITY)
Admission: RE | Admit: 2012-04-26 | Discharge: 2012-04-26 | Disposition: A | Discharge status: Home / Self Care | Payer: BC Managed Care – PPO | Source: Ambulatory Visit | Attending: Internal Medicine | Admitting: Internal Medicine

## 2012-04-26 ENCOUNTER — Encounter (HOSPITAL_COMMUNITY): Payer: Self-pay | Admitting: Gastroenterology

## 2012-04-26 DIAGNOSIS — K5730 Diverticulosis of large intestine without perforation or abscess without bleeding: Secondary | ICD-10-CM | POA: Insufficient documentation

## 2012-04-26 DIAGNOSIS — Z860100 Personal history of colon polyps, unspecified: Secondary | ICD-10-CM | POA: Insufficient documentation

## 2012-04-26 DIAGNOSIS — D126 Benign neoplasm of colon, unspecified: Secondary | ICD-10-CM | POA: Insufficient documentation

## 2012-04-26 DIAGNOSIS — Z1211 Encounter for screening for malignant neoplasm of colon: Secondary | ICD-10-CM | POA: Insufficient documentation

## 2012-04-26 DIAGNOSIS — K912 Postsurgical malabsorption, not elsewhere classified: Secondary | ICD-10-CM | POA: Insufficient documentation

## 2012-04-26 HISTORY — PX: COLONOSCOPY: SHX5424

## 2012-04-26 SURGERY — COLONOSCOPY
Anesthesia: Moderate Sedation

## 2012-04-26 MED ORDER — FENTANYL CITRATE 0.05 MG/ML IJ SOLN
INTRAMUSCULAR | Status: DC | PRN
  Administered 2012-04-26 (×4): 25 ug via INTRAVENOUS

## 2012-04-26 MED ORDER — FENTANYL CITRATE 0.05 MG/ML IJ SOLN
INTRAMUSCULAR | Status: AC
  Filled 2012-04-26: qty 2

## 2012-04-26 MED ORDER — MIDAZOLAM HCL 5 MG/5ML IJ SOLN
INTRAMUSCULAR | Status: DC | PRN
  Administered 2012-04-26: 2.5 mg via INTRAVENOUS
  Administered 2012-04-26: 1 mg via INTRAVENOUS
  Administered 2012-04-26: 2 mg via INTRAVENOUS
  Administered 2012-04-26: 1 mg via INTRAVENOUS
  Administered 2012-04-26: 2.5 mg via INTRAVENOUS

## 2012-04-26 MED ORDER — MIDAZOLAM HCL 10 MG/2ML IJ SOLN
INTRAMUSCULAR | Status: AC
  Filled 2012-04-26: qty 2

## 2012-04-26 MED ORDER — HEPARIN SOD (PORK) LOCK FLUSH 100 UNIT/ML IV SOLN
INTRAVENOUS | Status: AC
  Filled 2012-04-26: qty 5

## 2012-04-26 NOTE — Telephone Encounter (Signed)
I have advised Amber of last week's conversation.  That Vickie will be letting me know if she wants her records released to them and that we are happy to send what they request with a signed release.

## 2012-04-26 NOTE — H&P (Signed)
  Cc: hx colon polyps  HPI:  Here for follow-up colonoscopy due to history of polyps. About to start Gattex therapy for short bowel.  Medications reviewed   Allergies  Allergen Reactions  . Iohexol Itching    IVP Dye ok if taken with benadryl  . Penicillins Itching    Past Medical History  Diagnosis Date  . Short bowel syndrome   . Abnormal LFTs   . Vitamin D deficiency   . Personal history of colonic polyps   . Thrombophilia   . Diabetes mellitus     resolved after weight loss  . Obesity     prior to weight loss  . Allergic rhinosinusitis   . At risk for dental problems   . Fracture of left clavicle   . Osteoporosis   . Anemia of chronic disease   . Renal insufficiency   . Atypical nevus   . Pathologic fracture of neck of femur   . Pancytopenia 10/07/2011  . Small bowel ischemia   . Superior mesenteric artery stenosis   . Bacterial overgrowth syndrome    Past Surgical History  Procedure Date  . Small intestine surgery 2005    multiple with right colon resection for ischemia/infarct  . Cholecystectomy   . Orif proximal femoral fracture w/ itst nail system 03/2007    left, Dr. Simonne Come  . Esophagogastroduodenoscopy 01/22/2009    erosive esophagitis  . Colonoscopy 12/05/2005    internal hemorrhoids (for polyp surveillance)   History   Social History  . Marital Status: Married    Spouse Name: N/A    Number of Children: N/A  . Years of Education: N/A   Social History Main Topics  . Smoking status: Former Smoker    Types: Cigarettes    Quit date: 05/12/2004  . Smokeless tobacco: Never Used  . Alcohol Use: No  . Drug Use: No  . Sexually Active: Not Currently   Other Topics Concern  . None   Social History Narrative  . None   Family History  Problem Relation Age of Onset  . Diabetes Mother   . Hypertension Mother   . Colon cancer Neg Hx   . Stomach cancer Neg Hx       PE:  See pre-sedation evaluation Abdomen is soft and nontender, s/p  surgeries  Ass/Plan:  Personal hx colon polyps   Colonoscopy will be performed.

## 2012-04-26 NOTE — Op Note (Signed)
[facility] [address] [place], [address]   COLONOSCOPY PROCEDURE REPORT  PATIENT: [name] [name], [name] [name]  MR#: [id number] BIRTHDATE: Mar 18, 1951 , 60  yrs. old GENDER: Female ENDOSCOPIST: Iva Boop, MD, [facility] PROCEDURE DATE:  04/26/2012 PROCEDURE:   Colonoscopy with snare polypectomy ASA CLASS:   Class III INDICATIONS:Screening and surveillance,personal history of colonic polyps and Patient's personal history of adenomatous colon polyps.  MEDICATIONS: Fentanyl 100 mcg IV and Versed 9 mg IV  DESCRIPTION OF PROCEDURE:   After the risks benefits and alternatives of the procedure were thoroughly explained, informed consent was obtained.  A digital rectal exam revealed no abnormalities of the rectum.   The Pentax Colonoscope [id number] endoscope was introduced through the anus and advanced to the surgical anastomosis. No adverse events experienced.   The quality of the prep was adequate, using MoviPrep  The instrument was then slowly withdrawn as the colon was fully examined. Photo malfunction - none obtained     COLON FINDINGS: A smooth sessile polyp measuring 6 mm in size was found in the proximal sigmoid colon.  A polypectomy was performed with a cold snare.  The resection was complete and the polyp tissue was completely retrieved.   Moderate diverticulosis was noted The finding was in the left colon.   There was evidence of a prior end-to-side surgical anastomosis, jejunal-colon.  The finding was in the right colon.   The colon mucosa was otherwise normal. Retroflexed views revealed no abnormalities. The time to cecum=22 minutes 0 seconds.  Withdrawal time=12 minutes 0 seconds.  The scope was withdrawn and the procedure completed. COMPLICATIONS: There were no complications.  ENDOSCOPIC IMPRESSION: 1.   Sessile polyp measuring 6 mm in size was found in the proximal sigmoid colon; polypectomy was performed with a cold snare 2.   Moderate diverticulosis was  noted in the left colon 3.   There was evidence of a prior surgical anastomosis in the right colon 4.   The colon mucosa was otherwise normal - adequate prep 5. Personal history adenomas 2 serrated adenomas 2005, none 2007  RECOMMENDATIONS: Timing of repeat colonoscopy will be determined by pathology findings.   eSigned:  Iva Boop, MD, [facility] 04/26/2012 8:46 AM   cc: The Patient    Britt Bolognese, MD

## 2012-04-27 ENCOUNTER — Encounter: Payer: Self-pay | Admitting: Internal Medicine

## 2012-04-27 ENCOUNTER — Encounter (HOSPITAL_COMMUNITY): Payer: Self-pay | Admitting: Internal Medicine

## 2012-04-27 NOTE — Progress Notes (Signed)
Quick Note:  6 mm sessile serrated polyp - repeat colon 04/2017 Please send a copy of the path report and the procedure report to Dr. Gwenith Spitz at [facility] ______

## 2012-04-29 ENCOUNTER — Telehealth: Payer: Self-pay | Admitting: *Deleted

## 2012-04-29 ENCOUNTER — Other Ambulatory Visit (HOSPITAL_BASED_OUTPATIENT_CLINIC_OR_DEPARTMENT_OTHER): Payer: BC Managed Care – PPO | Admitting: Lab

## 2012-04-29 ENCOUNTER — Other Ambulatory Visit: Payer: BC Managed Care – PPO | Admitting: Lab

## 2012-04-29 ENCOUNTER — Encounter: Payer: Self-pay | Admitting: Internal Medicine

## 2012-04-29 ENCOUNTER — Ambulatory Visit (HOSPITAL_BASED_OUTPATIENT_CLINIC_OR_DEPARTMENT_OTHER): Payer: BC Managed Care – PPO | Admitting: Oncology

## 2012-04-29 ENCOUNTER — Encounter: Payer: Self-pay | Admitting: Oncology

## 2012-04-29 VITALS — BP 145/83 | HR 69 | Temp 98.5°F | Resp 20 | Ht 68.0 in | Wt 147.2 lb

## 2012-04-29 DIAGNOSIS — D638 Anemia in other chronic diseases classified elsewhere: Secondary | ICD-10-CM

## 2012-04-29 DIAGNOSIS — D696 Thrombocytopenia, unspecified: Secondary | ICD-10-CM

## 2012-04-29 DIAGNOSIS — D6859 Other primary thrombophilia: Secondary | ICD-10-CM

## 2012-04-29 DIAGNOSIS — K909 Intestinal malabsorption, unspecified: Secondary | ICD-10-CM

## 2012-04-29 DIAGNOSIS — I749 Embolism and thrombosis of unspecified artery: Secondary | ICD-10-CM

## 2012-04-29 LAB — PROTIME-INR
INR: 1.2 — ABNORMAL LOW (ref 2.00–3.50)
Protime: 14.4 Seconds — ABNORMAL HIGH (ref 10.6–13.4)

## 2012-04-29 MED ORDER — ONDANSETRON HCL 4 MG PO TABS: 4.0000 mg | ORAL_TABLET | Freq: Three times a day (TID) | ORAL | Status: DC | PRN

## 2012-04-29 MED ORDER — PROCHLORPERAZINE MALEATE 10 MG PO TABS: 10.0000 mg | ORAL_TABLET | Freq: Four times a day (QID) | ORAL | Status: DC | PRN

## 2012-04-29 NOTE — Progress Notes (Signed)
OFFICE PROGRESS NOTE  CC  [name] Rhymes, MD 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 Do to malabsorption.  #2 anemia of chronic disease. Patient is on Aranesp injections every 3 weeks.  #3 history of thrombosis on Coumadin to try to keep her INR between 2 and 2.5.but  right now she is on Lovenox daily  CURRENT THERAPY: Aranesp injections and anticoagulation with Lovenox currently  INTERVAL HISTORY: [name] [name] 61 y.o. female returns for followup visit today.She has no bleeding problems. She has proceeded to go back to getting her Aranesp injections she received an injection this past week. Remainder of the 10 point review of systems is unremarkable.  MEDICAL HISTORY: Past Medical History  Diagnosis Date  . Short bowel syndrome   . Abnormal LFTs   . Vitamin D deficiency   . Personal history of colonic polyps   . Thrombophilia   . Diabetes mellitus     resolved after weight loss  . Obesity     prior to weight loss  . Allergic rhinosinusitis   . At risk for dental problems   . Fracture of left clavicle   . Osteoporosis   . Anemia of chronic disease   . Renal insufficiency   . Atypical nevus   . Pathologic fracture of neck of femur   . Pancytopenia 10/07/2011  . Small bowel ischemia   . Superior mesenteric artery stenosis   . Bacterial overgrowth syndrome     ALLERGIES:  is allergic to iohexol and penicillins.  MEDICATIONS:  Current Outpatient Prescriptions  Medication Sig Dispense Refill  . ADULT TPN Inject 2,100 mLs into the vein continuous. Infuse over 10 hours with 1 hour ramp up and 1 hour ramp down. Total bag per day provides: 250g Dextrose 70%, 100g Travasol 10%, 45g Intralipids 30%, various electrolytes, MVI, TE, Zinc, selenium.      . carvedilol (COREG) 3.125 MG tablet Take 1 tablet (3.125  mg total) by mouth 2 (two) times daily with a meal.  60 tablet  1  . ciprofloxacin (CIPRO) 500 MG tablet prn      . clobetasol ointment (TEMOVATE) 0.05 % Apply 1 application topically 2 (two) times daily.  60 g  3  . diphenhydrAMINE (BENADRYL) 25 mg capsule Take 25 mg by mouth every 6 (six) hours as needed. For itching/sleep.      . Eflornithine HCl (VANIQA) 13.9 % cream Apply 1 application topically 2 (two) times daily with a meal. Applied to facial hair.      Marland Kitchen enoxaparin (LOVENOX) 100 MG/ML injection Inject 1 mL (100 mg total) into the skin daily. To start the evening after the procedure.  30 Syringe  0  . furosemide (LASIX) 20 MG tablet TAKE ONE TABLET BY MOUTH TWICE DAILY  60 tablet  1  . isosorbide dinitrate (ISORDIL) 10 MG tablet Take 1 tablet (10 mg total) by mouth 2 (two) times daily.  60 tablet  1  . Multiple Vitamin (MULTIVITAMIN WITH MINERALS) TABS Take 1 tablet by mouth daily.      Marland Kitchen omeprazole (PRILOSEC) 40 MG capsule Take 40 mg by mouth daily.      . Parenteral Electrolytes (TPN ELECTROLYTES IV) Inject 1 each into the vein at bedtime.       . potassium chloride (K-DUR,KLOR-CON) 10 MEQ tablet Take 10 mEq by mouth as  needed.      . ursodiol (ACTIGALL) 300 MG capsule TAKE 1 CAPSULE BY MOUTH TWICE DAILY  60 capsule  6  . Vitamin D, Ergocalciferol, (DRISDOL) 50000 UNITS CAPS ALTERNATE TAKING 1 CAPSULE BY MOUTH EVERY OTHER DAY AND 2 CAPSULES BY MOUTH EVERY OTHER DAY  135 capsule  2  . warfarin (COUMADIN) 1 MG tablet TAKE AS DIRECTED  30 tablet  0  . promethazine (PHENERGAN) 25 MG tablet Take 25 mg by mouth every 6 (six) hours as needed. For nausea.        SURGICAL HISTORY:  Past Surgical History  Procedure Date  . Small intestine surgery 2005    multiple with right colon resection for ischemia/infarct  . Cholecystectomy   . Orif proximal femoral fracture w/ itst nail system 03/2007    left, Dr. Simonne Come  . Esophagogastroduodenoscopy 01/22/2009    erosive esophagitis  .  Colonoscopy 12/05/2005    internal hemorrhoids (for polyp surveillance)  . Colonoscopy 04/26/2012    Procedure: COLONOSCOPY;  Surgeon: Iva Boop, MD;  Location: [facility] ENDOSCOPY;  Service: Endoscopy;  Laterality: N/A;    REVIEW OF SYSTEMS:  Pertinent items are noted in HPI.     PHYSICAL EXAMINATION: Blood pressure 145/83, pulse 69, temperature 98.5 F (36.9 C), temperature source Oral, resp. rate 20, height 5\' 8"  (1.727 m), weight 147 lb 3.2 oz (66.769 kg). Body mass index is 22.38 kg/(m^2).  ECOG PERFORMANCE STATUS: 1 - Symptomatic but completely ambulatory   HEENT exam patient is edentulous EOMI PERRLA sclerae anicteric no conjunctival pallor oral mucosa is moist no thrush neck is supple no palpable adenopathy lungs are clear cardiovascular is regular rate rhythm abdomen soft nontender no HSM extremities no edema neuro is nonfocal  LABORATORY DATA: Lab Results  Component Value Date   WBC 5.5 04/01/2012   HGB 11.1* 04/01/2012   HCT 32.7* 04/01/2012   MCV 94.6 04/01/2012   PLT 144* 04/01/2012      Chemistry      Component Value Date/Time   NA 138 04/01/2012 0958   NA 132* 12/27/2011 0538   NA 140 03/01/2009 1029   K 4.6 04/01/2012 0958   K 4.1 12/27/2011 0538   K 4.2 03/01/2009 1029   CL 104 04/01/2012 0958   CL 97 12/27/2011 0538   CL 104 03/01/2009 1029   CO2 29 04/01/2012 0958   CO2 26 12/27/2011 0538   CO2 25 03/01/2009 1029   BUN 31.0* 04/01/2012 0958   BUN 40* 12/27/2011 0538   BUN 17 03/01/2009 1029   CREATININE 1.3* 04/01/2012 0958   CREATININE 2.74* 12/27/2011 0538   CREATININE 0.5* 03/01/2009 1029      Component Value Date/Time   CALCIUM 9.2 04/01/2012 0958   CALCIUM 8.4 12/27/2011 0538   CALCIUM 8.7 03/01/2009 1029   ALKPHOS 97 04/01/2012 0958   ALKPHOS 148* 12/27/2011 0538   ALKPHOS 143* 03/01/2009 1029   AST 32 04/01/2012 0958   AST 40* 12/27/2011 0538   AST 46* 03/01/2009 1029   ALT 32 04/01/2012 0958   ALT 25 12/27/2011 0538   BILITOT 0.43 04/01/2012  0958   BILITOT 0.4 12/27/2011 0538   BILITOT 0.40 03/01/2009 1029       RADIOGRAPHIC STUDIES:  No results found.  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  #2 anemia of chronic disease secondary to malabsorption and multiple medical problems she is on Aranesp injections overall tolerating it well.  #3 from the  cytopenia likely multifactorial we are observing only.  #4 thrombosis has been on Coumadin in the past. But do to recent or seizures she was converted to Lovenox. She and I discussed the possibility of doing xeralto   PLAN:   #1 patient will continue to get Aranesp injections every 3 weeks.  #2 patient is currently on Lovenox daily. She and I did discuss the possibility of her going on xeralto. But currently she is unsure and therefore she will continue Lovenox until she makes up her mind.  #3 I will plan on seeing her back in 6 month's time for followup.   All questions were answered. The patient knows to call the clinic with any problems, questions or concerns. We can certainly see the patient much sooner if necessary.  I spent 25 minutes counseling the patient face to face. The total time spent in the appointment was 30 minutes.    Drue Second, MD Medical/Oncology [facility] [phone] (beeper) [phone] (Office)  04/29/2012, 9:34 AM

## 2012-04-29 NOTE — Telephone Encounter (Signed)
Gave patient appointment for 10-2012 

## 2012-04-29 NOTE — Patient Instructions (Addendum)
Continue lovenox until you reach a decision about coumadin  Call me with your decision

## 2012-05-04 ENCOUNTER — Encounter: Payer: Self-pay | Admitting: Internal Medicine

## 2012-05-06 ENCOUNTER — Other Ambulatory Visit: Payer: BC Managed Care – PPO

## 2012-05-10 ENCOUNTER — Telehealth: Payer: Self-pay | Admitting: Internal Medicine

## 2012-05-10 NOTE — Telephone Encounter (Signed)
Katie notified that I have not seen a release, but would go through our medical records department and that she would need to speak with them.  I transferred her call

## 2012-05-13 ENCOUNTER — Other Ambulatory Visit: Payer: Self-pay | Admitting: Medical Oncology

## 2012-05-13 ENCOUNTER — Telehealth: Payer: Self-pay | Admitting: *Deleted

## 2012-05-13 ENCOUNTER — Telehealth: Payer: Self-pay | Admitting: Medical Oncology

## 2012-05-13 ENCOUNTER — Other Ambulatory Visit: Payer: Self-pay | Admitting: Oncology

## 2012-05-13 ENCOUNTER — Other Ambulatory Visit (HOSPITAL_BASED_OUTPATIENT_CLINIC_OR_DEPARTMENT_OTHER): Payer: BC Managed Care – PPO | Admitting: Lab

## 2012-05-13 DIAGNOSIS — D6859 Other primary thrombophilia: Secondary | ICD-10-CM

## 2012-05-13 DIAGNOSIS — D638 Anemia in other chronic diseases classified elsewhere: Secondary | ICD-10-CM

## 2012-05-13 LAB — CBC WITH DIFFERENTIAL/PLATELET
Basophils Absolute: 0 10*3/uL (ref 0.0–0.1)
EOS%: 8.1 % — ABNORMAL HIGH (ref 0.0–7.0)
Eosinophils Absolute: 0.4 10*3/uL (ref 0.0–0.5)
HCT: 29.9 % — ABNORMAL LOW (ref 34.8–46.6)
HGB: 9.6 g/dL — ABNORMAL LOW (ref 11.6–15.9)
MCH: 30.7 pg (ref 25.1–34.0)
MCV: 95.5 fL (ref 79.5–101.0)
MONO%: 9 % (ref 0.0–14.0)
NEUT#: 1.7 10*3/uL (ref 1.5–6.5)
NEUT%: 31.7 % — ABNORMAL LOW (ref 38.4–76.8)
Platelets: 86 10*3/uL — ABNORMAL LOW (ref 145–400)

## 2012-05-13 NOTE — Telephone Encounter (Signed)
Pt called and spoke with pt regarding lab results. Patient confirmed for aranesp injection 05/14/12 @ 0915. All questions answered. No further questions at this time.

## 2012-05-13 NOTE — Telephone Encounter (Signed)
Patient confirmed over the phone the new date and time for the injection

## 2012-05-13 NOTE — Telephone Encounter (Signed)
Pt LVMOM requesting results of CBC and whether she needs an aranesp injection. Will review with MD.

## 2012-05-13 NOTE — Telephone Encounter (Signed)
Attempted to call pt and notify her per MD that she needs an aranesp injection, no answer to phone number provided and voice mail option. Sent scheduling mssg to sched pt for injection tomorrow and to notify pt.

## 2012-05-13 NOTE — Telephone Encounter (Signed)
Pt needs an aranesp inj

## 2012-05-14 ENCOUNTER — Telehealth: Payer: Self-pay | Admitting: Internal Medicine

## 2012-05-14 ENCOUNTER — Ambulatory Visit (HOSPITAL_BASED_OUTPATIENT_CLINIC_OR_DEPARTMENT_OTHER): Payer: BC Managed Care – PPO

## 2012-05-14 VITALS — BP 132/68 | HR 52 | Temp 97.3°F | Resp 18

## 2012-05-14 DIAGNOSIS — D638 Anemia in other chronic diseases classified elsewhere: Secondary | ICD-10-CM

## 2012-05-14 MED ORDER — DARBEPOETIN ALFA-POLYSORBATE 300 MCG/0.6ML IJ SOLN
300.0000 ug | Freq: Once | INTRAMUSCULAR | Status: AC
  Administered 2012-05-14: 300 ug via SUBCUTANEOUS
  Filled 2012-05-14: qty 0.6

## 2012-05-14 NOTE — Telephone Encounter (Signed)
Patient will change her TPN over to this company.  They will need to contact [employer] for exact orders of TPN.

## 2012-05-20 ENCOUNTER — Other Ambulatory Visit: Payer: BC Managed Care – PPO | Admitting: Lab

## 2012-05-24 NOTE — Progress Notes (Signed)
Patient ID: [name] [name], female   DOB: 12-09-1950, 62 y.o.   MRN: [id number] Faxed lab work done 05/18/12 CBC/diff, CMET, phosphorus, magnesium, prealbumin to Dr. Britt Bolognese at [facility] , fax # [phone] per Dr. Izola Price instructions.

## 2012-05-26 ENCOUNTER — Telehealth: Payer: Self-pay | Admitting: Internal Medicine

## 2012-05-27 ENCOUNTER — Other Ambulatory Visit (HOSPITAL_BASED_OUTPATIENT_CLINIC_OR_DEPARTMENT_OTHER): Payer: BC Managed Care – PPO | Admitting: Lab

## 2012-05-27 ENCOUNTER — Other Ambulatory Visit: Payer: Self-pay | Admitting: Adult Health

## 2012-05-27 ENCOUNTER — Telehealth: Payer: Self-pay | Admitting: Medical Oncology

## 2012-05-27 DIAGNOSIS — D6859 Other primary thrombophilia: Secondary | ICD-10-CM

## 2012-05-27 LAB — PROTIME-INR: Protime: 13.2 Seconds (ref 10.6–13.4)

## 2012-05-27 NOTE — Telephone Encounter (Signed)
Per patient, she has not taken any blood thinners for last two days, before that she was on Lovenox 100mg . Patient asking if she needs to continue with blood thiners? Does she need another appt with MD to follow up on new medication of Gattex and any contraindications regarding Gattex? Also, pt wishes to know if she needs an anticoagulant test done. Will review with MD.

## 2012-05-27 NOTE — Telephone Encounter (Signed)
Patient will be getting her TPN and Gatex from one company. The new TPN orders will be coming from [facility].  They will be sending you new orders and labs to sign.  The plan is to start her on TPN from [employer] and [employer] next Friday.  They will be sending you weekly updates and labs with recommendation on weaning TPN and labs.  They will also be faxing a letter you will need to sign for [employer] stating why she needs TPN, she will get this out in the next day or so.

## 2012-05-31 ENCOUNTER — Telehealth: Payer: Self-pay | Admitting: Oncology

## 2012-05-31 NOTE — Telephone Encounter (Signed)
s.w. pt and advised on 2.6.14 appt schedule.Marland KitchenMarland KitchenMarland KitchenMarland Kitchenpt ok and aware

## 2012-06-02 ENCOUNTER — Telehealth: Payer: Self-pay | Admitting: Internal Medicine

## 2012-06-02 NOTE — Telephone Encounter (Signed)
katient advised that the orders are not here to send.  She is advised that if we see them we will send them.  The patient had requested of [employer] not to contact [employer] directly

## 2012-06-03 ENCOUNTER — Other Ambulatory Visit: Payer: BC Managed Care – PPO

## 2012-06-10 ENCOUNTER — Encounter: Payer: Self-pay | Admitting: Internal Medicine

## 2012-06-10 ENCOUNTER — Other Ambulatory Visit: Payer: BC Managed Care – PPO

## 2012-06-17 ENCOUNTER — Encounter: Payer: Self-pay | Admitting: Internal Medicine

## 2012-06-17 ENCOUNTER — Telehealth: Payer: Self-pay | Admitting: Medical Oncology

## 2012-06-17 ENCOUNTER — Other Ambulatory Visit: Payer: Self-pay | Admitting: Medical Oncology

## 2012-06-17 ENCOUNTER — Other Ambulatory Visit (HOSPITAL_BASED_OUTPATIENT_CLINIC_OR_DEPARTMENT_OTHER): Payer: BC Managed Care – PPO

## 2012-06-17 DIAGNOSIS — D638 Anemia in other chronic diseases classified elsewhere: Secondary | ICD-10-CM

## 2012-06-17 DIAGNOSIS — D6859 Other primary thrombophilia: Secondary | ICD-10-CM

## 2012-06-17 LAB — CBC WITH DIFFERENTIAL/PLATELET
Basophils Absolute: 0 10*3/uL (ref 0.0–0.1)
Eosinophils Absolute: 0.7 10*3/uL — ABNORMAL HIGH (ref 0.0–0.5)
HGB: 10.7 g/dL — ABNORMAL LOW (ref 11.6–15.9)
MCV: 96.8 fL (ref 79.5–101.0)
MONO#: 0.4 10*3/uL (ref 0.1–0.9)
MONO%: 8.3 % (ref 0.0–14.0)
NEUT#: 1.9 10*3/uL (ref 1.5–6.5)
Platelets: 100 10*3/uL — ABNORMAL LOW (ref 145–400)
RBC: 3.33 10*6/uL — ABNORMAL LOW (ref 3.70–5.45)
RDW: 14.8 % — ABNORMAL HIGH (ref 11.2–14.5)
WBC: 5 10*3/uL (ref 3.9–10.3)

## 2012-06-17 NOTE — Telephone Encounter (Signed)
Per MD, informed patient of lab results and that she is due for Aranesp injection also that scheduling will contact her to set up time for Friday 02/07. Onc tx sent.

## 2012-06-18 ENCOUNTER — Encounter: Payer: Self-pay | Admitting: Medical Oncology

## 2012-06-18 ENCOUNTER — Telehealth: Payer: Self-pay | Admitting: Medical Oncology

## 2012-06-18 ENCOUNTER — Ambulatory Visit (HOSPITAL_BASED_OUTPATIENT_CLINIC_OR_DEPARTMENT_OTHER): Payer: BC Managed Care – PPO

## 2012-06-18 ENCOUNTER — Encounter: Payer: Self-pay | Admitting: Internal Medicine

## 2012-06-18 VITALS — BP 119/72 | HR 61 | Temp 97.6°F

## 2012-06-18 DIAGNOSIS — K909 Intestinal malabsorption, unspecified: Secondary | ICD-10-CM

## 2012-06-18 DIAGNOSIS — D638 Anemia in other chronic diseases classified elsewhere: Secondary | ICD-10-CM

## 2012-06-18 MED ORDER — DARBEPOETIN ALFA-POLYSORBATE 300 MCG/0.6ML IJ SOLN
300.0000 ug | Freq: Once | INTRAMUSCULAR | Status: AC
  Administered 2012-06-18: 300 ug via SUBCUTANEOUS
  Filled 2012-06-18: qty 0.6

## 2012-06-18 NOTE — Progress Notes (Signed)
Patient ID: [name] [name], female   DOB: [birth year], 62 y.o.   MRN: [id number] Per Dr. Leone Payor faxed signed TPN order, current lab results and clinic notes and letter regarding pre-approval for home based total parenteral nutrition.  Forms sent to be scanned in.  Fax # [phone].  Att: Katie.

## 2012-06-18 NOTE — Telephone Encounter (Signed)
Patient in today for injection appt asking about message she left 01/16 regarding Dr. Herfarth in [place] and anticoagulation studies as well as Gattex, medication for her to "get off of TPN." Patient provided Dr Herfarth's nurses phone number Laurie Powers [phone]. Patient wishes to know what to do or for Dr Herfarth to be called back. Mssg forwarded to MD. 

## 2012-06-18 NOTE — Progress Notes (Deleted)
Patient in today for injection appt asking about message she left 01/16 regarding Dr. Gwenith Spitz in [facility] and anticoagulation studies as well as Gattex, medication for her to "get off of TPN." Patient provided Dr Hassan Rowan nurses phone number [place] [phone]. Patient wishes to know what to do or for Dr Gwenith Spitz to be called back. Mssg forwarded to MD.

## 2012-06-21 ENCOUNTER — Telehealth: Payer: Self-pay | Admitting: Family Medicine

## 2012-06-21 DIAGNOSIS — I10 Essential (primary) hypertension: Secondary | ICD-10-CM

## 2012-06-21 MED ORDER — CARVEDILOL 3.125 MG PO TABS: 3.1250 mg | ORAL_TABLET | Freq: Two times a day (BID) | ORAL | Status: DC

## 2012-06-21 NOTE — Telephone Encounter (Signed)
Refill: Carvedilol 3.125 mg tablets. Take 1 tablet by mouth twice daily with a meal. Qty 60. Last fill 05-01-12

## 2012-06-21 NOTE — Telephone Encounter (Signed)
Refill for cardivelol sent to [facility] in [place]

## 2012-06-22 ENCOUNTER — Telehealth: Payer: Self-pay | Admitting: Internal Medicine

## 2012-06-22 NOTE — Telephone Encounter (Signed)
Just need your Viviano Simas please Milford Cage, its in your box on your desk,  Thank you.

## 2012-06-22 NOTE — Telephone Encounter (Signed)
Signed order faxed to [phone] [facility].

## 2012-06-24 ENCOUNTER — Other Ambulatory Visit: Payer: BC Managed Care – PPO

## 2012-07-01 ENCOUNTER — Other Ambulatory Visit: Payer: BC Managed Care – PPO

## 2012-07-05 ENCOUNTER — Telehealth: Payer: Self-pay | Admitting: Internal Medicine

## 2012-07-05 NOTE — Telephone Encounter (Signed)
All questions answered.  She will call back for any additional questions

## 2012-07-05 NOTE — Telephone Encounter (Signed)
No answer and no machine I will continue to try and reach the patient. 

## 2012-07-06 ENCOUNTER — Telehealth: Payer: Self-pay | Admitting: Internal Medicine

## 2012-07-06 NOTE — Telephone Encounter (Signed)
They will fax over the orders to be signed.

## 2012-07-07 ENCOUNTER — Telehealth: Payer: Self-pay | Admitting: Medical Oncology

## 2012-07-07 NOTE — Telephone Encounter (Signed)
Jacki Cones with Dr Eusebio Friendly [facility] regarding Dr Herforth's pager number [phone] and when best to contact him. Information given to MD.

## 2012-07-08 ENCOUNTER — Other Ambulatory Visit: Payer: BC Managed Care – PPO

## 2012-07-09 ENCOUNTER — Ambulatory Visit: Payer: BC Managed Care – PPO

## 2012-07-09 ENCOUNTER — Other Ambulatory Visit (HOSPITAL_BASED_OUTPATIENT_CLINIC_OR_DEPARTMENT_OTHER): Payer: BC Managed Care – PPO | Admitting: Lab

## 2012-07-09 DIAGNOSIS — D649 Anemia, unspecified: Secondary | ICD-10-CM

## 2012-07-09 DIAGNOSIS — D638 Anemia in other chronic diseases classified elsewhere: Secondary | ICD-10-CM

## 2012-07-09 LAB — CBC WITH DIFFERENTIAL/PLATELET
Basophils Absolute: 0 10*3/uL (ref 0.0–0.1)
Eosinophils Absolute: 0.6 10*3/uL — ABNORMAL HIGH (ref 0.0–0.5)
HCT: 36.9 % (ref 34.8–46.6)
LYMPH%: 40.9 % (ref 14.0–49.7)
MCV: 97.4 fL (ref 79.5–101.0)
MONO#: 0.6 10*3/uL (ref 0.1–0.9)
MONO%: 10.8 % (ref 0.0–14.0)
NEUT#: 1.8 10*3/uL (ref 1.5–6.5)
NEUT%: 35.8 % — ABNORMAL LOW (ref 38.4–76.8)
Platelets: 116 10*3/uL — ABNORMAL LOW (ref 145–400)
WBC: 5.1 10*3/uL (ref 3.9–10.3)

## 2012-07-09 MED ORDER — DARBEPOETIN ALFA-POLYSORBATE 500 MCG/ML IJ SOLN: 300.0000 ug | Freq: Once | INTRAMUSCULAR | Status: DC

## 2012-07-11 ENCOUNTER — Encounter (HOSPITAL_COMMUNITY): Payer: Self-pay | Admitting: *Deleted

## 2012-07-11 ENCOUNTER — Emergency Department (HOSPITAL_COMMUNITY)
Admission: EM | Admit: 2012-07-11 | Discharge: 2012-07-11 | Disposition: A | Discharge status: Home / Self Care | Payer: BC Managed Care – PPO | Attending: Emergency Medicine | Admitting: Emergency Medicine

## 2012-07-11 ENCOUNTER — Emergency Department (HOSPITAL_COMMUNITY): Payer: BC Managed Care – PPO

## 2012-07-11 DIAGNOSIS — E669 Obesity, unspecified: Secondary | ICD-10-CM | POA: Insufficient documentation

## 2012-07-11 DIAGNOSIS — Z87448 Personal history of other diseases of urinary system: Secondary | ICD-10-CM | POA: Insufficient documentation

## 2012-07-11 DIAGNOSIS — Z8781 Personal history of (healed) traumatic fracture: Secondary | ICD-10-CM | POA: Insufficient documentation

## 2012-07-11 DIAGNOSIS — Z860100 Personal history of colon polyps, unspecified: Secondary | ICD-10-CM | POA: Insufficient documentation

## 2012-07-11 DIAGNOSIS — Z789 Other specified health status: Secondary | ICD-10-CM | POA: Insufficient documentation

## 2012-07-11 DIAGNOSIS — Z79899 Other long term (current) drug therapy: Secondary | ICD-10-CM | POA: Insufficient documentation

## 2012-07-11 DIAGNOSIS — Z872 Personal history of diseases of the skin and subcutaneous tissue: Secondary | ICD-10-CM | POA: Insufficient documentation

## 2012-07-11 DIAGNOSIS — Z87891 Personal history of nicotine dependence: Secondary | ICD-10-CM | POA: Insufficient documentation

## 2012-07-11 DIAGNOSIS — M810 Age-related osteoporosis without current pathological fracture: Secondary | ICD-10-CM | POA: Insufficient documentation

## 2012-07-11 DIAGNOSIS — T859XXA Unspecified complication of internal prosthetic device, implant and graft, initial encounter: Secondary | ICD-10-CM

## 2012-07-11 DIAGNOSIS — J309 Allergic rhinitis, unspecified: Secondary | ICD-10-CM | POA: Insufficient documentation

## 2012-07-11 DIAGNOSIS — Z862 Personal history of diseases of the blood and blood-forming organs and certain disorders involving the immune mechanism: Secondary | ICD-10-CM | POA: Insufficient documentation

## 2012-07-11 DIAGNOSIS — T82598A Other mechanical complication of other cardiac and vascular devices and implants, initial encounter: Secondary | ICD-10-CM | POA: Insufficient documentation

## 2012-07-11 DIAGNOSIS — E119 Type 2 diabetes mellitus without complications: Secondary | ICD-10-CM | POA: Insufficient documentation

## 2012-07-11 DIAGNOSIS — Y832 Surgical operation with anastomosis, bypass or graft as the cause of abnormal reaction of the patient, or of later complication, without mention of misadventure at the time of the procedure: Secondary | ICD-10-CM | POA: Insufficient documentation

## 2012-07-11 DIAGNOSIS — Z8719 Personal history of other diseases of the digestive system: Secondary | ICD-10-CM | POA: Insufficient documentation

## 2012-07-11 DIAGNOSIS — E559 Vitamin D deficiency, unspecified: Secondary | ICD-10-CM | POA: Insufficient documentation

## 2012-07-11 DIAGNOSIS — Z8679 Personal history of other diseases of the circulatory system: Secondary | ICD-10-CM | POA: Insufficient documentation

## 2012-07-11 NOTE — ED Notes (Signed)
Discharge instructions reviewed w/ pt., verbalizes understanding. No prescriptions provided at discharge. 

## 2012-07-11 NOTE — ED Notes (Signed)
Pt states she woke up this morning and noticed her PICC line was out, states called her doctor and was told to come here to get xray to see if part of it did not come out, no tip noticed on PICC line pt brought in. Pt states she feels normal. Pt states she's had a PICC line x 7 years for TPN administration.

## 2012-07-11 NOTE — ED Provider Notes (Signed)
History     CSN: [id number]  Arrival date & time 07/11/12  1039   First MD Initiated Contact with Patient 07/11/12 1109      Chief Complaint  Patient presents with  . 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 She is able eat and drink on her own so she does not need a PICC line emergency replaced. She has chronic diarrhea which is stable. She is asymptomatic in terms of the PICC line dislodgment. There is no treatment prior to arrival and no pain. Past Medical History  Diagnosis Date  . Short bowel syndrome   . Abnormal LFTs   . Vitamin D deficiency   . Personal history of colonic polyps   . Thrombophilia   . Diabetes mellitus     resolved after weight loss  . Obesity     prior to weight loss  . Allergic rhinosinusitis   . At risk for dental problems   . Fracture of left clavicle   . Osteoporosis   . Anemia of chronic disease   . Renal insufficiency   . Atypical nevus   . Pathologic fracture of neck of femur   . Pancytopenia 10/07/2011  . Small bowel ischemia   . Superior mesenteric artery stenosis   . Bacterial overgrowth syndrome     Past Surgical History  Procedure Laterality Date  . Small intestine surgery  2005    multiple with right colon resection for ischemia/infarct  . Cholecystectomy    . Orif proximal femoral fracture w/ itst nail system  03/2007    left, Dr. Simonne Come  . Esophagogastroduodenoscopy  01/22/2009    erosive esophagitis  . Colonoscopy  12/05/2005    internal hemorrhoids (for polyp surveillance)  . Colonoscopy  04/26/2012    Procedure: COLONOSCOPY;  Surgeon: Iva Boop, MD;  Location: [facility];  Service: Endoscopy;  Laterality: N/A;    Family History  Problem Relation Age of Onset  . Diabetes Mother   . Hypertension Mother   . Colon cancer Neg Hx   . Stomach cancer Neg Hx     History  Substance Use Topics  . Smoking status: Former Smoker    Types: Cigarettes    Quit date: 05/12/2004  . Smokeless tobacco: Never Used  . Alcohol Use: No    OB History   Grav Para Term Preterm Abortions TAB SAB Ect Mult Living                  Review of Systems 10 Systems reviewed and are negative for acute change except as noted in the HPI. Allergies  Iohexol and Penicillins  Home Medications   Current Outpatient Rx  Name  Route  Sig  Dispense  Refill  . ADULT TPN   Intravenous   Inject 2,100 mLs into the vein continuous. Infuse over 10 hours with 1 hour ramp up and 1  hour ramp down. Total bag per day provides: 250g Dextrose 70%, 100g Travasol 10%, 45g Intralipids 30%, various electrolytes, MVI, TE, Zinc, selenium.         . carvedilol (COREG) 3.125 MG tablet   Oral   Take 3.125 mg by mouth 2 (two) times daily with a meal.         . diphenhydrAMINE (BENADRYL) 25 mg capsule   Oral   Take 25 mg by mouth every 6 (six) hours as needed. For itching/sleep.         . ergocalciferol (VITAMIN D2) 50000 UNITS capsule   Oral   Take 50,000 Units by mouth every morning. Takes 1 capsule (50,000 units) on Tuesday, Thursday, Saturday and Sunday...takes 2 capsules(100,000 units) on Mondays, wednesdays and fridays         . isosorbide dinitrate (ISORDIL) 10 MG tablet   Oral   Take 10 mg by mouth 2 (two) times daily.         . Multiple Vitamin (MULTIVITAMIN WITH MINERALS) TABS   Oral   Take 1 tablet by  mouth daily.         Marland Kitchen omeprazole (PRILOSEC) 40 MG capsule   Oral   Take 40 mg by mouth daily.         . pseudoephedrine (SUDAFED) 60 MG tablet   Oral   Take 120 mg by mouth once.         . ursodiol (ACTIGALL) 300 MG capsule   Oral   Take 300 mg by mouth 2 (two) times daily.           BP 124/68  Pulse 62  Temp(Src) 98.2 F (36.8 C) (Oral)  Resp 16  SpO2 97%  Physical Exam  Nursing note and vitals reviewed. Constitutional:  Awake, alert, nontoxic appearance.  HENT:  Head: Atraumatic.  Eyes: Right eye exhibits no discharge. Left eye exhibits no discharge.  Neck: Neck supple.  Cardiovascular: Normal rate and regular rhythm.   No murmur heard. Pulmonary/Chest: Effort normal and breath sounds normal. No respiratory distress. She has no wheezes. She has no rales. She exhibits no tenderness.  Abdominal: Soft. Bowel sounds are normal. She exhibits no distension. There is no tenderness. There is no rebound and no guarding.  Musculoskeletal: She exhibits no tenderness.  Baseline ROM, no obvious new focal weakness. Right upper arm is nontender with no erythema, right hand has capillary refill less than 2 seconds normal light touch full active range of motion with intact sensation and motor strength in the distributions of the median radial and ulnar nerve function.  Neurological:  Mental status and motor strength appears baseline for patient and situation.  Skin: No rash noted.  Psychiatric: She has a normal mood and affect.    ED Course  Procedures (including critical care time)  Labs Reviewed - No data to display Dg Chest 2 View  07/11/2012  *RADIOLOGY REPORT*  Clinical Data: PICC line broke, fell out, evaluate for residual catheter in chest.  CHEST - 2 VIEW  Comparison: 12/25/2011  Findings: No visible catheter fragment within the visualized chest. Heart is normal size.  Lungs are clear.  No effusions or acute bony abnormality.  IMPRESSION: No acute cardiopulmonary  disease.  No visualized residual catheter fragment.   Original Report Authenticated By: Charlett Nose, M.D.    Ir Fluoro Guide Cv Line Right  07/12/2012  *RADIOLOGY REPORT*  Clinical Data: Short gut syndrome; hx previously placed PICC which was inadvertently pulled out; request is made for  placement of new PICC for TPN, medications.  PICC LINE PLACEMENT WITH ULTRASOUND AND FLUOROSCOPIC  GUIDANCE  Fluoroscopy Time: 3.6 minutes.  The right arm was prepped with chlorhexidine, draped in the usual sterile fashion using maximum barrier technique (cap and mask, sterile gown, sterile gloves, large sterile sheet, hand hygiene and cutaneous antisepsis) and infiltrated locally with 1% Lidocaine.  Ultrasound demonstrated patency of the right basilic vein, and this was documented with an image.  Under real-time ultrasound guidance, this vein was accessed with a 21 gauge micropuncture needle and image documentation was performed.  The needle was exchanged over a guidewire for a peel-away sheath through which a 5 [place] single lumen PICC trimmed to 40 cm was advanced, positioned with its tip at the lower SVC/right atrial junction.  Fluoroscopy during the procedure and fluoro spot radiograph confirms appropriate catheter position.  The catheter was flushed, secured to the skin with Prolene sutures, and covered with a sterile dressing.  Complications:  none  IMPRESSION: Successful right arm PICC line placement with ultrasound and fluoroscopic guidance.  The catheter is ready for use.  Read by: Jeananne Rama, P.A.-C   Original Report Authenticated By: Jolaine Click, M.D.      1. Complication of device, initial encounter       MDM  Pt stable in ED with no significant deterioration in condition.  Patient / Family / Caregiver informed of clinical course, understand medical decision-making process, and agree with plan.  I doubt any other EMC precluding discharge at this time including, but not necessarily limited to the  following:PE, sepsis.        Hurman Horn, MD 07/12/12 [phone]

## 2012-07-12 ENCOUNTER — Other Ambulatory Visit: Payer: Self-pay | Admitting: Internal Medicine

## 2012-07-12 ENCOUNTER — Ambulatory Visit (HOSPITAL_COMMUNITY)
Admission: RE | Admit: 2012-07-12 | Discharge: 2012-07-12 | Disposition: A | Discharge status: Home / Self Care | Payer: BC Managed Care – PPO | Source: Ambulatory Visit | Attending: Internal Medicine | Admitting: Internal Medicine

## 2012-07-12 ENCOUNTER — Telehealth: Payer: Self-pay | Admitting: Internal Medicine

## 2012-07-12 DIAGNOSIS — Z452 Encounter for adjustment and management of vascular access device: Secondary | ICD-10-CM

## 2012-07-12 DIAGNOSIS — K912 Postsurgical malabsorption, not elsewhere classified: Secondary | ICD-10-CM | POA: Insufficient documentation

## 2012-07-12 NOTE — Telephone Encounter (Signed)
Patient given the appt information by Karna Christmas, [facility]

## 2012-07-12 NOTE — Procedures (Signed)
US/fluoroscopic guided right basilic vein single lumen PICC placed. Length 40 cm. Tip SVC/RA junction. No immediate complications.

## 2012-07-12 NOTE — Telephone Encounter (Signed)
Patient is scheduled for [facility] at 11:00

## 2012-07-14 ENCOUNTER — Telehealth: Payer: Self-pay | Admitting: Medical Oncology

## 2012-07-14 NOTE — Telephone Encounter (Signed)
Pt LVMOM stating she had no scheduled lab and injection appts, and needed to have that sched every three weeks. Per MD, onc tx sent. Scheduling to notify.  Patient also inquiring "is there anything we can do check clotting times in regards to my starting this Gattexn drug?" Informed pt will review with MD.

## 2012-07-15 ENCOUNTER — Telehealth: Payer: Self-pay | Admitting: Oncology

## 2012-07-15 ENCOUNTER — Other Ambulatory Visit: Payer: BC Managed Care – PPO

## 2012-07-15 NOTE — Telephone Encounter (Signed)
S/w the pt and she is aware of her march lab and injection appts and will pick up the rest of her appt schedules at that time.

## 2012-07-22 ENCOUNTER — Encounter: Payer: Self-pay | Admitting: Internal Medicine

## 2012-07-26 ENCOUNTER — Telehealth: Payer: Self-pay | Admitting: Internal Medicine

## 2012-07-26 NOTE — Telephone Encounter (Signed)
I spoke with the Home Health nurse and she was not able to draw from PICC line.  TPN has infused fine, no signs of infection or occlusion of PICC.  They were not able to draw blood at all (even for a peripheral stick).  She is going to go to the lab tomorrow for another person to attempt.  HH nurse wanted you to be aware

## 2012-07-27 ENCOUNTER — Ambulatory Visit (HOSPITAL_COMMUNITY)
Admission: RE | Admit: 2012-07-27 | Discharge: 2012-07-27 | Disposition: A | Discharge status: Home / Self Care | Payer: BC Managed Care – PPO | Source: Ambulatory Visit | Attending: Radiology | Admitting: Radiology

## 2012-07-27 ENCOUNTER — Other Ambulatory Visit (HOSPITAL_COMMUNITY): Payer: Self-pay | Admitting: Radiology

## 2012-07-27 ENCOUNTER — Telehealth: Payer: Self-pay | Admitting: Internal Medicine

## 2012-07-27 ENCOUNTER — Other Ambulatory Visit: Payer: Self-pay | Admitting: Radiology

## 2012-07-27 DIAGNOSIS — K912 Postsurgical malabsorption, not elsewhere classified: Secondary | ICD-10-CM

## 2012-07-27 DIAGNOSIS — T82598A Other mechanical complication of other cardiac and vascular devices and implants, initial encounter: Secondary | ICD-10-CM | POA: Insufficient documentation

## 2012-07-27 DIAGNOSIS — E46 Unspecified protein-calorie malnutrition: Secondary | ICD-10-CM | POA: Insufficient documentation

## 2012-07-27 DIAGNOSIS — Y849 Medical procedure, unspecified as the cause of abnormal reaction of the patient, or of later complication, without mention of misadventure at the time of the procedure: Secondary | ICD-10-CM | POA: Insufficient documentation

## 2012-07-27 MED ORDER — CHLORHEXIDINE GLUCONATE 4 % EX LIQD
CUTANEOUS | Status: AC
  Filled 2012-07-27: qty 30

## 2012-07-27 NOTE — Procedures (Signed)
Successful RUE PICC REMOVAL  PLACEMENT OF A NEW SL POWER PICC LUE TIP SVC/RA NO COMP STABLE AND READY FOR USE FULL REPORT IN PACS

## 2012-07-27 NOTE — Telephone Encounter (Signed)
Spoke with patient and told her she should call radiology

## 2012-07-29 ENCOUNTER — Encounter: Payer: Self-pay | Admitting: Internal Medicine

## 2012-07-29 ENCOUNTER — Other Ambulatory Visit: Payer: Self-pay | Admitting: Emergency Medicine

## 2012-07-29 DIAGNOSIS — D638 Anemia in other chronic diseases classified elsewhere: Secondary | ICD-10-CM

## 2012-07-29 DIAGNOSIS — K912 Postsurgical malabsorption, not elsewhere classified: Secondary | ICD-10-CM

## 2012-07-30 ENCOUNTER — Ambulatory Visit (HOSPITAL_BASED_OUTPATIENT_CLINIC_OR_DEPARTMENT_OTHER): Payer: BC Managed Care – PPO

## 2012-07-30 ENCOUNTER — Telehealth: Payer: Self-pay | Admitting: *Deleted

## 2012-07-30 ENCOUNTER — Other Ambulatory Visit (HOSPITAL_BASED_OUTPATIENT_CLINIC_OR_DEPARTMENT_OTHER): Payer: BC Managed Care – PPO | Admitting: Lab

## 2012-07-30 ENCOUNTER — Other Ambulatory Visit: Payer: Self-pay | Admitting: Emergency Medicine

## 2012-07-30 VITALS — BP 144/52 | HR 76 | Temp 97.3°F | Resp 18

## 2012-07-30 DIAGNOSIS — K909 Intestinal malabsorption, unspecified: Secondary | ICD-10-CM

## 2012-07-30 DIAGNOSIS — K912 Postsurgical malabsorption, not elsewhere classified: Secondary | ICD-10-CM

## 2012-07-30 DIAGNOSIS — D638 Anemia in other chronic diseases classified elsewhere: Secondary | ICD-10-CM

## 2012-07-30 LAB — COMPREHENSIVE METABOLIC PANEL (CC13)
ALT: 23 U/L (ref 0–55)
CO2: 25 mEq/L (ref 22–29)
Calcium: 9.3 mg/dL (ref 8.4–10.4)
Chloride: 106 mEq/L (ref 98–107)
Creatinine: 1.1 mg/dL (ref 0.6–1.1)
Glucose: 113 mg/dl — ABNORMAL HIGH (ref 70–99)

## 2012-07-30 LAB — CBC WITH DIFFERENTIAL/PLATELET
BASO%: 0.6 % (ref 0.0–2.0)
Basophils Absolute: 0 10*3/uL (ref 0.0–0.1)
Eosinophils Absolute: 0.4 10*3/uL (ref 0.0–0.5)
HCT: 32.5 % — ABNORMAL LOW (ref 34.8–46.6)
HGB: 10.8 g/dL — ABNORMAL LOW (ref 11.6–15.9)
MONO#: 0.5 10*3/uL (ref 0.1–0.9)
NEUT#: 2.1 10*3/uL (ref 1.5–6.5)
NEUT%: 40.6 % (ref 38.4–76.8)
WBC: 5.2 10*3/uL (ref 3.9–10.3)
lymph#: 2.2 10*3/uL (ref 0.9–3.3)

## 2012-07-30 LAB — C-REACTIVE PROTEIN: CRP: 0.9 mg/dL — ABNORMAL HIGH (ref <0.60)

## 2012-07-30 MED ORDER — DARBEPOETIN ALFA-POLYSORBATE 300 MCG/0.6ML IJ SOLN
300.0000 ug | Freq: Once | INTRAMUSCULAR | Status: AC
  Administered 2012-07-30: 300 ug via SUBCUTANEOUS
  Filled 2012-07-30: qty 0.6

## 2012-07-30 NOTE — Telephone Encounter (Signed)
Received a call from [place] with [facility]. She wanted to be sure we saw the CRP results from earlier this week. The patient was 11.4 and she is usually 1.  Neysa Bonito states the patient had her PICC replace the day before and it was a difficult placement. Patient is having it repeated today at the [facility].

## 2012-07-30 NOTE — Patient Instructions (Signed)
Darbepoetin Alfa injection What is this medicine? DARBEPOETIN ALFA (dar be POE e tin AL fa) helps your body make more red blood cells. It is used to treat anemia caused by chronic kidney failure and chemotherapy. This medicine may be used for other purposes; ask your health care provider or pharmacist if you have questions. What should I tell my health care provider before I take this medicine? They need to know if you have any of these conditions: -blood clotting disorders or history of blood clots -cancer patient not on chemotherapy -cystic fibrosis -heart disease, such as angina, heart failure, or a history of a heart attack -hemoglobin level of 12 g/dL or greater -high blood pressure -low levels of folate, iron, or vitamin B12 -seizures -an unusual or allergic reaction to darbepoetin, erythropoietin, albumin, hamster proteins, latex, other medicines, foods, dyes, or preservatives -pregnant or trying to get pregnant -breast-feeding How should I use this medicine? This medicine is for injection into a vein or under the skin. It is usually given by a health care professional in a hospital or clinic setting. If you get this medicine at home, you will be taught how to prepare and give this medicine. Do not shake the solution before you withdraw a dose. Use exactly as directed. Take your medicine at regular intervals. Do not take your medicine more often than directed. It is important that you put your used needles and syringes in a special sharps container. Do not put them in a trash can. If you do not have a sharps container, call your pharmacist or healthcare provider to get one. Talk to your pediatrician regarding the use of this medicine in children. While this medicine may be used in children as young as 1 year for selected conditions, precautions do apply. Overdosage: If you think you have taken too much of this medicine contact a poison control center or emergency room at once. NOTE:  This medicine is only for you. Do not share this medicine with others. What if I miss a dose? If you miss a dose, take it as soon as you can. If it is almost time for your next dose, take only that dose. Do not take double or extra doses. What may interact with this medicine? Do not take this medicine with any of the following medications: -epoetin alfa This list may not describe all possible interactions. Give your health care provider a list of all the medicines, herbs, non-prescription drugs, or dietary supplements you use. Also tell them if you smoke, drink alcohol, or use illegal drugs. Some items may interact with your medicine. What should I watch for while using this medicine? Visit your prescriber or health care professional for regular checks on your progress and for the needed blood tests and blood pressure measurements. It is especially important for the doctor to make sure your hemoglobin level is in the desired range, to limit the risk of potential side effects and to give you the best benefit. Keep all appointments for any recommended tests. Check your blood pressure as directed. Ask your doctor what your blood pressure should be and when you should contact him or her. As your body makes more red blood cells, you may need to take iron, folic acid, or vitamin B supplements. Ask your doctor or health care provider which products are right for you. If you have kidney disease continue dietary restrictions, even though this medication can make you feel better. Talk with your doctor or health care professional about the   foods you eat and the vitamins that you take. What side effects may I notice from receiving this medicine? Side effects that you should report to your doctor or health care professional as soon as possible: -allergic reactions like skin rash, itching or hives, swelling of the face, lips, or tongue -breathing problems -changes in vision -chest pain -confusion, trouble speaking  or understanding -feeling faint or lightheaded, falls -high blood pressure -muscle aches or pains -pain, swelling, warmth in the leg -rapid weight gain -severe headaches -sudden numbness or weakness of the face, arm or leg -trouble walking, dizziness, loss of balance or coordination -seizures (convulsions) -swelling of the ankles, feet, hands -unusually weak or tired Side effects that usually do not require medical attention (report to your doctor or health care professional if they continue or are bothersome): -diarrhea -fever, chills (flu-like symptoms) -headaches -nausea, vomiting -redness, stinging, or swelling at site where injected This list may not describe all possible side effects. Call your doctor for medical advice about side effects. You may report side effects to FDA at 1-800-FDA-1088. Where should I keep my medicine? Keep out of the reach of children. Store in a refrigerator between 2 and 8 degrees C (36 and 46 degrees F). Do not freeze. Do not shake. Throw away any unused portion if using a single-dose vial. Throw away any unused medicine after the expiration date. NOTE: This sheet is a summary. It may not cover all possible information. If you have questions about this medicine, talk to your doctor, pharmacist, or health care provider.  2013, [employer]. (04/11/2008 10:23:57 AM)  

## 2012-08-04 ENCOUNTER — Telehealth: Payer: Self-pay | Admitting: Internal Medicine

## 2012-08-04 NOTE — Telephone Encounter (Signed)
Spoke with Crystal and gave her problem list on patient.

## 2012-08-09 ENCOUNTER — Other Ambulatory Visit: Payer: Self-pay

## 2012-08-09 ENCOUNTER — Telehealth: Payer: Self-pay | Admitting: Internal Medicine

## 2012-08-09 MED ORDER — LOPERAMIDE HCL 2 MG PO TABS: ORAL_TABLET | ORAL | Status: DC

## 2012-08-09 MED ORDER — DIPHENOXYLATE-ATROPINE 2.5-0.025 MG PO TABS: ORAL_TABLET | ORAL | Status: DC

## 2012-08-09 NOTE — Progress Notes (Signed)
Per Dr. Leone Payor based on recommendations from [facility] Guerry Minors, RD.  Patient needs imodium 2 po 1/2 hour ac meals sent in and change in Vitamin D.  I have a call into [facility] to call back and clarify the dosage of Vitamin D.      I spoke with the patient and Byrd Hesselbach from [place].  She is aware that imodium has been sent to the pharmacy.  They will give instructions to [place] for Vitamin D3.  They will help her find the correct concentration as it is OTC.  She knows to contact them if she can't find it locally.

## 2012-08-09 NOTE — Telephone Encounter (Signed)
[name] [name], [facility] already has faxed rx and LM for patient to call us back.

## 2012-08-20 ENCOUNTER — Ambulatory Visit: Payer: BC Managed Care – PPO

## 2012-08-20 ENCOUNTER — Other Ambulatory Visit (HOSPITAL_BASED_OUTPATIENT_CLINIC_OR_DEPARTMENT_OTHER): Payer: BC Managed Care – PPO | Admitting: Lab

## 2012-08-20 DIAGNOSIS — K912 Postsurgical malabsorption, not elsewhere classified: Secondary | ICD-10-CM

## 2012-08-20 DIAGNOSIS — D6859 Other primary thrombophilia: Secondary | ICD-10-CM

## 2012-08-20 DIAGNOSIS — D638 Anemia in other chronic diseases classified elsewhere: Secondary | ICD-10-CM

## 2012-08-20 LAB — CBC WITH DIFFERENTIAL/PLATELET
BASO%: 0.5 % (ref 0.0–2.0)
Basophils Absolute: 0 10*3/uL (ref 0.0–0.1)
EOS%: 9.1 % — ABNORMAL HIGH (ref 0.0–7.0)
HGB: 11 g/dL — ABNORMAL LOW (ref 11.6–15.9)
MCH: 29.3 pg (ref 25.1–34.0)
MCHC: 31.8 g/dL (ref 31.5–36.0)
RDW: 13.8 % (ref 11.2–14.5)
WBC: 5.7 10*3/uL (ref 3.9–10.3)
lymph#: 1.8 10*3/uL (ref 0.9–3.3)

## 2012-08-20 MED ORDER — DARBEPOETIN ALFA-POLYSORBATE 500 MCG/ML IJ SOLN: 300.0000 ug | Freq: Once | INTRAMUSCULAR | Status: DC

## 2012-08-23 ENCOUNTER — Ambulatory Visit (INDEPENDENT_AMBULATORY_CARE_PROVIDER_SITE_OTHER): Payer: BC Managed Care – PPO | Admitting: Family Medicine

## 2012-08-23 ENCOUNTER — Encounter: Payer: Self-pay | Admitting: Family Medicine

## 2012-08-23 VITALS — BP 146/80 | HR 69 | Temp 98.6°F | Ht 67.75 in | Wt 147.8 lb

## 2012-08-23 DIAGNOSIS — J019 Acute sinusitis, unspecified: Secondary | ICD-10-CM

## 2012-08-23 MED ORDER — SULFAMETHOXAZOLE-TRIMETHOPRIM 800-160 MG PO TABS: 1.0000 | ORAL_TABLET | Freq: Two times a day (BID) | ORAL | Status: DC

## 2012-08-23 MED ORDER — CARVEDILOL 3.125 MG PO TABS: 3.1250 mg | ORAL_TABLET | Freq: Two times a day (BID) | ORAL | Status: DC

## 2012-08-23 NOTE — Progress Notes (Signed)
  Subjective:    Patient ID: [name] [name], female    DOB: 10-17-1950, 62 y.o.   MRN: [id number]  HPI Ear pain- R ear, sxs started 2 weeks ago.  Started after doing yard work.  Started Claritin D and all sxs have improved except ear.  Pain is worse at night.  Pain radiates into neck.  Also having R sided sinus pain.  No fevers.  Mild nasal congestion.  + upper tooth pain on R.   Review of Systems For ROS see HPI     Objective:   Physical Exam  Vitals reviewed. Constitutional: She appears well-developed and well-nourished. No distress.  HENT:  Head: Normocephalic and atraumatic.  Right Ear: Tympanic membrane normal.  Left Ear: Tympanic membrane normal.  Nose: Mucosal edema and rhinorrhea present. Right sinus exhibits maxillary sinus tenderness and frontal sinus tenderness. Left sinus exhibits no maxillary sinus tenderness and no frontal sinus tenderness.  Mouth/Throat: Uvula is midline and mucous membranes are normal. Posterior oropharyngeal erythema present. No oropharyngeal exudate.  Eyes: Conjunctivae and EOM are normal. Pupils are equal, round, and reactive to light.  Neck: Normal range of motion. Neck supple.  Cardiovascular: Normal rate, regular rhythm and normal heart sounds.   Pulmonary/Chest: Effort normal and breath sounds normal. No respiratory distress. She has no wheezes.  Lymphadenopathy:    She has no cervical adenopathy.          Assessment & Plan:

## 2012-08-23 NOTE — Assessment & Plan Note (Signed)
Pt's sxs and PE consistent w/ infxn.  Start abx.  Reviewed supportive care and red flags that should prompt return.  Pt expressed understanding and is in agreement w/ plan.  

## 2012-08-23 NOTE — Patient Instructions (Addendum)
This is a sinus infection Start the Bactrim twice daily Drink plenty of fluids REST! Hang in there!!!

## 2012-08-27 ENCOUNTER — Telehealth: Payer: Self-pay | Admitting: Family Medicine

## 2012-08-27 NOTE — Telephone Encounter (Signed)
ISOSORBIDE DINITRATE 10MG  ORAL TABS QTY: 60 LAST REFILL: 2.10.14 TAKE 1 TABLET BY MOUTH TWICE DAILY

## 2012-08-30 MED ORDER — ISOSORBIDE DINITRATE 10 MG PO TABS: 10.0000 mg | ORAL_TABLET | Freq: Every day | ORAL | Status: DC

## 2012-08-30 NOTE — Telephone Encounter (Signed)
Last refill:06-21-12,we last refilled:03-03-12. Last OV:08-23-12 for Sinus infection-BP:146/80 Called the pt to find out who was refilling her meds and she stated Dr. Beverely Low.  Asked the pt how she was taking the med and she stated she was taking it once daily instead of twice daily.  Asked the pt who prescribed the med and when and who changed her to once daily.  Pt stated that the rx was prescribed when she was in the hospital in Aug. 2013 for CHF by Cardio.  She saw the Cardio last 02-2012 and she was taking the med once daily as well.  Pt stated she started taking the med once daily herself(no reason why other than she had a lot going on).   Pt took last pill today.  Please advise.//AB/CMA

## 2012-08-30 NOTE — Telephone Encounter (Signed)
Med filled and chart updated.  

## 2012-08-30 NOTE — Telephone Encounter (Signed)
Ok for #30, 6 refills- please change med directions to once daily since that is how she is taking it

## 2012-09-06 ENCOUNTER — Encounter: Payer: Self-pay | Admitting: Internal Medicine

## 2012-09-06 NOTE — Progress Notes (Signed)
Patient ID: [name] [name], female   DOB: May 01, 1951, 62 y.o.   MRN: [id number] Faxed the signed Home Health Certification and plan of care to [facility] at Fax # [phone].

## 2012-09-09 ENCOUNTER — Other Ambulatory Visit: Payer: Self-pay | Admitting: Medical Oncology

## 2012-09-09 DIAGNOSIS — D638 Anemia in other chronic diseases classified elsewhere: Secondary | ICD-10-CM

## 2012-09-10 ENCOUNTER — Other Ambulatory Visit (HOSPITAL_BASED_OUTPATIENT_CLINIC_OR_DEPARTMENT_OTHER): Payer: BC Managed Care – PPO | Admitting: Lab

## 2012-09-10 ENCOUNTER — Ambulatory Visit (HOSPITAL_BASED_OUTPATIENT_CLINIC_OR_DEPARTMENT_OTHER): Payer: BC Managed Care – PPO

## 2012-09-10 VITALS — BP 108/63 | HR 65 | Temp 97.8°F

## 2012-09-10 DIAGNOSIS — D638 Anemia in other chronic diseases classified elsewhere: Secondary | ICD-10-CM

## 2012-09-10 LAB — COMPREHENSIVE METABOLIC PANEL (CC13)
Albumin: 2.8 g/dL — ABNORMAL LOW (ref 3.5–5.0)
BUN: 35 mg/dL — ABNORMAL HIGH (ref 7.0–26.0)
Calcium: 8.9 mg/dL (ref 8.4–10.4)
Chloride: 105 mEq/L (ref 98–107)
Glucose: 104 mg/dl — ABNORMAL HIGH (ref 70–99)
Potassium: 4.8 mEq/L (ref 3.5–5.1)

## 2012-09-10 LAB — CBC WITH DIFFERENTIAL/PLATELET
Basophils Absolute: 0 10*3/uL (ref 0.0–0.1)
Eosinophils Absolute: 0.6 10*3/uL — ABNORMAL HIGH (ref 0.0–0.5)
HGB: 9.9 g/dL — ABNORMAL LOW (ref 11.6–15.9)
NEUT#: 2.2 10*3/uL (ref 1.5–6.5)
RDW: 15.2 % — ABNORMAL HIGH (ref 11.2–14.5)
lymph#: 2.4 10*3/uL (ref 0.9–3.3)

## 2012-09-10 MED ORDER — DARBEPOETIN ALFA-POLYSORBATE 300 MCG/0.6ML IJ SOLN
300.0000 ug | Freq: Once | INTRAMUSCULAR | Status: AC
  Administered 2012-09-10: 300 ug via SUBCUTANEOUS
  Filled 2012-09-10: qty 0.6

## 2012-09-14 ENCOUNTER — Encounter: Payer: Self-pay | Admitting: Internal Medicine

## 2012-09-14 NOTE — Progress Notes (Signed)
Patient ID: [name] [name], female   DOB: 1950-08-25, 62 y.o.   MRN: [id number] Faxed Dr. Leone Payor signed orders to [facility] for her home parenteral nutrition plan.  Faxed to # [phone].  Sent form down to be scanned into epic.

## 2012-09-16 ENCOUNTER — Encounter: Payer: Self-pay | Admitting: Internal Medicine

## 2012-09-16 NOTE — Progress Notes (Unsigned)
Patient ID: [name] [name], female   DOB: [birth year], 62 y.o.   MRN: [id number] Suzette Battiest called from [facility] to get dx codes for recent lab work drawn.  Gave her codes from her snap shot page.

## 2012-09-17 ENCOUNTER — Other Ambulatory Visit: Payer: Self-pay

## 2012-09-17 ENCOUNTER — Telehealth: Payer: Self-pay | Admitting: General Practice

## 2012-09-17 ENCOUNTER — Emergency Department (HOSPITAL_COMMUNITY)
Admission: EM | Admit: 2012-09-17 | Discharge: 2012-09-17 | Disposition: A | Discharge status: Home / Self Care | Payer: BC Managed Care – PPO | Attending: Emergency Medicine | Admitting: Emergency Medicine

## 2012-09-17 ENCOUNTER — Emergency Department (HOSPITAL_COMMUNITY): Payer: BC Managed Care – PPO

## 2012-09-17 ENCOUNTER — Encounter (HOSPITAL_COMMUNITY): Payer: Self-pay | Admitting: Emergency Medicine

## 2012-09-17 DIAGNOSIS — M79609 Pain in unspecified limb: Secondary | ICD-10-CM

## 2012-09-17 DIAGNOSIS — Z8739 Personal history of other diseases of the musculoskeletal system and connective tissue: Secondary | ICD-10-CM | POA: Insufficient documentation

## 2012-09-17 DIAGNOSIS — E669 Obesity, unspecified: Secondary | ICD-10-CM | POA: Insufficient documentation

## 2012-09-17 DIAGNOSIS — E559 Vitamin D deficiency, unspecified: Secondary | ICD-10-CM | POA: Insufficient documentation

## 2012-09-17 DIAGNOSIS — Z860100 Personal history of colon polyps, unspecified: Secondary | ICD-10-CM | POA: Insufficient documentation

## 2012-09-17 DIAGNOSIS — Z8781 Personal history of (healed) traumatic fracture: Secondary | ICD-10-CM | POA: Insufficient documentation

## 2012-09-17 DIAGNOSIS — Z8669 Personal history of other diseases of the nervous system and sense organs: Secondary | ICD-10-CM | POA: Insufficient documentation

## 2012-09-17 DIAGNOSIS — T82898A Other specified complication of vascular prosthetic devices, implants and grafts, initial encounter: Secondary | ICD-10-CM

## 2012-09-17 DIAGNOSIS — E119 Type 2 diabetes mellitus without complications: Secondary | ICD-10-CM | POA: Insufficient documentation

## 2012-09-17 DIAGNOSIS — K912 Postsurgical malabsorption, not elsewhere classified: Secondary | ICD-10-CM | POA: Insufficient documentation

## 2012-09-17 DIAGNOSIS — R079 Chest pain, unspecified: Secondary | ICD-10-CM

## 2012-09-17 DIAGNOSIS — R0789 Other chest pain: Secondary | ICD-10-CM | POA: Insufficient documentation

## 2012-09-17 DIAGNOSIS — Z87448 Personal history of other diseases of urinary system: Secondary | ICD-10-CM | POA: Insufficient documentation

## 2012-09-17 DIAGNOSIS — T82598A Other mechanical complication of other cardiac and vascular devices and implants, initial encounter: Secondary | ICD-10-CM | POA: Insufficient documentation

## 2012-09-17 DIAGNOSIS — Z87891 Personal history of nicotine dependence: Secondary | ICD-10-CM | POA: Insufficient documentation

## 2012-09-17 DIAGNOSIS — Z8719 Personal history of other diseases of the digestive system: Secondary | ICD-10-CM | POA: Insufficient documentation

## 2012-09-17 DIAGNOSIS — Z79899 Other long term (current) drug therapy: Secondary | ICD-10-CM | POA: Insufficient documentation

## 2012-09-17 DIAGNOSIS — Z8709 Personal history of other diseases of the respiratory system: Secondary | ICD-10-CM | POA: Insufficient documentation

## 2012-09-17 DIAGNOSIS — Z880 Allergy status to penicillin: Secondary | ICD-10-CM | POA: Insufficient documentation

## 2012-09-17 DIAGNOSIS — Z862 Personal history of diseases of the blood and blood-forming organs and certain disorders involving the immune mechanism: Secondary | ICD-10-CM | POA: Insufficient documentation

## 2012-09-17 DIAGNOSIS — I509 Heart failure, unspecified: Secondary | ICD-10-CM | POA: Insufficient documentation

## 2012-09-17 DIAGNOSIS — Y838 Other surgical procedures as the cause of abnormal reaction of the patient, or of later complication, without mention of misadventure at the time of the procedure: Secondary | ICD-10-CM | POA: Insufficient documentation

## 2012-09-17 LAB — CBC
MCH: 29.2 pg (ref 26.0–34.0)
MCV: 91.1 fL (ref 78.0–100.0)
Platelets: 130 10*3/uL — ABNORMAL LOW (ref 150–400)
RBC: 3.59 MIL/uL — ABNORMAL LOW (ref 3.87–5.11)
RDW: 16.7 % — ABNORMAL HIGH (ref 11.5–15.5)

## 2012-09-17 LAB — URINALYSIS, ROUTINE W REFLEX MICROSCOPIC
Bilirubin Urine: NEGATIVE
Glucose, UA: NEGATIVE mg/dL
Ketones, ur: NEGATIVE mg/dL
Protein, ur: NEGATIVE mg/dL

## 2012-09-17 LAB — BASIC METABOLIC PANEL
BUN: 22 mg/dL (ref 6–23)
CO2: 25 mEq/L (ref 19–32)
Calcium: 9.2 mg/dL (ref 8.4–10.5)
Creatinine, Ser: 0.99 mg/dL (ref 0.50–1.10)

## 2012-09-17 LAB — URINE MICROSCOPIC-ADD ON

## 2012-09-17 LAB — POCT I-STAT TROPONIN I: Troponin i, poc: 0 ng/mL (ref 0.00–0.08)

## 2012-09-17 MED ORDER — SODIUM CHLORIDE 0.9 % IV SOLN: 20.0000 mL | INTRAVENOUS | Status: DC

## 2012-09-17 MED ORDER — ASPIRIN 81 MG PO CHEW
CHEWABLE_TABLET | ORAL | Status: AC
  Administered 2012-09-17: 324 mg
  Filled 2012-09-17: qty 4

## 2012-09-17 NOTE — Progress Notes (Signed)
VASCULAR LAB PRELIMINARY  PRELIMINARY  PRELIMINARY  PRELIMINARY  Left upper extremity venous duplex completed.    Preliminary report:  Left:  DVT noted in a small segment of the brachial vein just proximal to the insertion of the PICC line  Troi Florendo, RVS 09/17/2012, 12:43 PM

## 2012-09-17 NOTE — Telephone Encounter (Signed)
Pt notified, had been on hold while Tabori was consulted. Pt advised she was leaving right now to go to ER for evaluation.

## 2012-09-17 NOTE — ED Notes (Signed)
Pt in IR - unable to round

## 2012-09-17 NOTE — Telephone Encounter (Signed)
Pt needs to go to ER immediately- may be heart attack, clot, or other serious issue

## 2012-09-17 NOTE — ED Notes (Signed)
Per pt, receives TPN, started having chest discomfort and left shoulder pain-thought it was r/t PICC line and her TPN infusion-chest discomfort/soreness this am-PCP told her to come to ED for eval

## 2012-09-17 NOTE — ED Notes (Signed)
Pt transported to IR 

## 2012-09-17 NOTE — ED Notes (Signed)
Pt back from IR.  Replaced PICC on L arm

## 2012-09-17 NOTE — ED Notes (Signed)
US at bedside

## 2012-09-17 NOTE — Telephone Encounter (Signed)
Pt has a PIC line in left arm and has been having some pain in her left chest, center of back, Pain radiates down arm to elbow. TPN pt did not infuse TPN last night due to pain. Pt has been having pain since yesterday afternoon 6/10. No dizziness, lightheadedness, nausea. Please advise.

## 2012-09-17 NOTE — Procedures (Signed)
Procedure:  Left arm PICC line replacment Findings:  Due to past difficulty placing PICC lines in both arms, decision made to exchange current PICC line.  Fluoro shows retraction of old PICC which is barely in SVC.  New 44 cm SL PICC placed over guidewire with tip at cavoatrial junction.  OK to use new PICC.

## 2012-09-17 NOTE — ED Provider Notes (Signed)
History     CSN: [id number]  Arrival date & time 09/17/12  4098   First MD Initiated Contact with Patient 09/17/12 1012      Chief Complaint  Patient presents with  . Chest Pain    (Consider location/radiation/quality/duration/timing/severity/associated sxs/prior treatment) HPI Comments: [name] [name] is a 62 y.o. Female who presents for evaluation of left shoulder and left upper chest pain. The pain has been present since yesterday, waxes and wanes, improves with ibuprofen and is persistent, this morning. The pain is a dull feeling and is currently 3/10. She states it is "aggravating". Her discomfort, worsened yesterday, when she did a confusion to her left arm PICC line, of TPN. She has never had an MI. She had an episode of CHF, last year that was related to "fluid overload". She has short gut syndrome, and was started on Gattex subcutaneous injections, January 2014. After this medication was started, she stopped taking Coumadin, which she had been on for intestinal ischemia, with arterial clots, as cause. No other thromboembolic disease. No recent illnesses. She denies fever, chills, nausea, vomiting, weakness, or dizziness. She is using her usual medications without relief. There are no other known modifying factors.  Patient is a 62 y.o. female presenting with chest pain. The history is provided by the patient.  Chest Pain   Past Medical History  Diagnosis Date  . Short bowel syndrome   . Abnormal LFTs   . Vitamin D deficiency   . Personal history of colonic polyps   . Thrombophilia   . Diabetes mellitus     resolved after weight loss  . Obesity     prior to weight loss  . Allergic rhinosinusitis   . At risk for dental problems   . Fracture of left clavicle   . Osteoporosis   . Anemia of chronic disease   . Renal insufficiency   . Atypical nevus   . Pathologic fracture of neck of femur   . Pancytopenia 10/07/2011  . Small bowel ischemia   . Superior mesenteric artery  stenosis   . Bacterial overgrowth syndrome     Past Surgical History  Procedure Laterality Date  . Small intestine surgery  2005    multiple with right colon resection for ischemia/infarct  . Cholecystectomy    . Orif proximal femoral fracture w/ itst nail system  03/2007    left, Dr. Simonne Come  . Esophagogastroduodenoscopy  01/22/2009    erosive esophagitis  . Colonoscopy  12/05/2005    internal hemorrhoids (for polyp surveillance)  . Colonoscopy  04/26/2012    Procedure: COLONOSCOPY;  Surgeon: Iva Boop, MD;  Location: [facility];  Service: Endoscopy;  Laterality: N/A;    Family History  Problem Relation Age of Onset  . Diabetes Mother   . Hypertension Mother   . Colon cancer Neg Hx   . Stomach cancer Neg Hx     History  Substance Use Topics  . Smoking status: Former Smoker    Types: Cigarettes    Quit date: 05/12/2004  . Smokeless tobacco: Never Used  . Alcohol Use: No    OB History   Grav Para Term Preterm Abortions TAB SAB Ect Mult Living                  Review of Systems  Cardiovascular: Positive for chest pain.  All other systems reviewed and are negative.    Allergies  Iohexol and Penicillins  Home Medications   Current Outpatient Rx  Name  Route  Sig  Dispense  Refill  . ADULT TPN   Intravenous   Inject 2,100 mLs into the vein continuous. Infuse over 10 hours with 1 hour ramp up and 1 hour ramp down. Total bag per day provides: 250g Dextrose 70%, 100g Travasol 10%, 45g Intralipids 30%, various electrolytes, MVI, TE, Zinc, selenium.         . carvedilol (COREG) 3.125 MG tablet   Oral   Take 1 tablet (3.125 mg total) by mouth 2 (two) times daily with a meal.   60 tablet   3   . diphenhydrAMINE (BENADRYL) 25 mg capsule   Oral   Take 25 mg by mouth every 6 (six) hours as needed. For itching/sleep.         . isosorbide dinitrate (ISORDIL) 10 MG tablet   Oral   Take 1 tablet (10 mg total) by mouth daily.   30 tablet   6    . loperamide (IMODIUM A-D) 2 MG tablet      Take 2 tablets 30 minutes prior to each meal   180 tablet   6   . Multiple Vitamin (MULTIVITAMIN WITH MINERALS) TABS   Oral   Take 1 tablet by mouth daily.         Marland Kitchen omeprazole (PRILOSEC) 40 MG capsule   Oral   Take 40 mg by mouth daily.         . Teduglutide, rDNA, (GATTEX) 5 MG KIT   Subcutaneous   Inject 3.3 Units into the skin daily.         . ursodiol (ACTIGALL) 300 MG capsule   Oral   Take 300 mg by mouth 2 (two) times daily.         . Vitamin D, Ergocalciferol, (DRISDOL) 50000 UNITS CAPS   Oral   Take 50,000 Units by mouth daily. Alternates every other day between 50,000 and 100,000           BP 121/58  Pulse 59  Temp(Src) 98.8 F (37.1 C) (Oral)  Resp 14  SpO2 100%  Physical Exam  Nursing note and vitals reviewed. Constitutional: She is oriented to person, place, and time. She appears well-developed.  Malnourished, chronically ill appearing.  HENT:  Head: Normocephalic and atraumatic.  Eyes: Conjunctivae and EOM are normal. Pupils are equal, round, and reactive to light.  Neck: Normal range of motion and phonation normal. Neck supple.  Cardiovascular: Normal rate, regular rhythm and intact distal pulses.    PICC left upper arm; device and site appears normal. There is mild left upper arm discomfort, to palpation, about a PIC line site. There is no apparent swelling of the left arm. There is no streaking up the skin of the left arm.  Pulmonary/Chest: Effort normal and breath sounds normal. No respiratory distress. She has no wheezes. She exhibits no tenderness.  Abdominal: Soft. She exhibits no distension. There is no tenderness. There is no guarding.  Musculoskeletal: Normal range of motion.  Neurological: She is alert and oriented to person, place, and time. She has normal strength. She exhibits normal muscle tone.  Skin: Skin is warm and dry.  Psychiatric: She has a normal mood and affect. Her  behavior is normal. Judgment and thought content normal.    ED Course  Procedures (including critical care time)   Medications  aspirin 81 MG chewable tablet (324 mg  Given 09/17/12 1011)    13:55 -Consult complete with Dr. Rica Records. Patient case explained and discussed. He agrees to  see the patient for further evaluation, by assessment of the PICC with IV Dye to assess its integrity. Call ended at 13:59      Date: 09:57 09/17/12  Rate: 69  Rhythm: normal sinus rhythm  QRS Axis: normal  PR and QT Intervals: normal  ST/T Wave abnormalities: nonspecific ST changes  PR and QRS Conduction Disutrbances:none  Narrative Interpretation: Q wave inferior  Old EKG Reviewed: changes noted- new nonspecific T wave abnormality. As compared to 12/26/11     Date: 10:13 09/17/12  Rate: 66  Rhythm: normal sinus rhythm  QRS Axis: normal  PR and QT Intervals: normal  ST/T Wave abnormalities: nonspecific ST changes  PR and QRS Conduction Disutrbances:none  Narrative Interpretation:   Old EKG Reviewed: unchanged- from tracing done, earlier, at 09:57      Labs Reviewed  CBC - Abnormal; Notable for the following:    RBC 3.59 (*)    Hemoglobin 10.5 (*)    HCT 32.7 (*)    RDW 16.7 (*)    Platelets 130 (*)    All other components within normal limits  BASIC METABOLIC PANEL - Abnormal; Notable for the following:    Glucose, Bld 115 (*)    GFR calc non Af Amer 60 (*)    GFR calc Af Amer 69 (*)    All other components within normal limits  URINALYSIS, ROUTINE W REFLEX MICROSCOPIC - Abnormal; Notable for the following:    Hgb urine dipstick MODERATE (*)    Leukocytes, UA SMALL (*)    All other components within normal limits  URINE MICROSCOPIC-ADD ON  POCT I-STAT TROPONIN I  POCT I-STAT TROPONIN I      1. Occluded PICC line, initial encounter   2. Chest pain       MDM  PICC line-related pain. Doubt ACS, PE, or pneumonia.   Care to oncoming provider team to evaluate patient after  return from interventional radiology        Flint Melter, MD 09/20/12 1730

## 2012-09-20 NOTE — Progress Notes (Signed)
Quick Note:  Pleas call her - I have reviewed ED visit  She had been on Lovenox  Please find out what status of anti-coagulation is She has a blood clot and I think she will need to be back on anti-coagulation if not (coordinated by Dr. Welton Flakes)     ______

## 2012-09-23 ENCOUNTER — Telehealth: Payer: Self-pay | Admitting: Internal Medicine

## 2012-09-23 DIAGNOSIS — R7982 Elevated C-reactive protein (CRP): Secondary | ICD-10-CM

## 2012-09-23 NOTE — Telephone Encounter (Signed)
[facility] needs a recheck on her CRP for her TPN and Gatex.  She will come in the am

## 2012-09-24 ENCOUNTER — Other Ambulatory Visit (INDEPENDENT_AMBULATORY_CARE_PROVIDER_SITE_OTHER): Payer: BC Managed Care – PPO

## 2012-09-24 ENCOUNTER — Telehealth: Payer: Self-pay | Admitting: Internal Medicine

## 2012-09-24 DIAGNOSIS — R7982 Elevated C-reactive protein (CRP): Secondary | ICD-10-CM

## 2012-09-24 LAB — C-REACTIVE PROTEIN: CRP: 0.7 mg/dL (ref 0.5–20.0)

## 2012-09-24 NOTE — Telephone Encounter (Signed)
Results faxed.

## 2012-09-27 ENCOUNTER — Telehealth: Payer: Self-pay | Admitting: Internal Medicine

## 2012-09-27 NOTE — Telephone Encounter (Signed)
Either is fine

## 2012-09-27 NOTE — Telephone Encounter (Signed)
Informed Beth, per Dr Leone Payor, either day is OK; please fax results.

## 2012-09-27 NOTE — Telephone Encounter (Signed)
Dr Leone Payor, I assume you are repeating CRP weekly for TPN and Gatex; is this correct and if so, can the Home Health Nurse draw on Friday or next Tuesday d/t the holiday? Thanks.

## 2012-10-01 ENCOUNTER — Other Ambulatory Visit (HOSPITAL_BASED_OUTPATIENT_CLINIC_OR_DEPARTMENT_OTHER): Payer: BC Managed Care – PPO | Admitting: Lab

## 2012-10-01 ENCOUNTER — Ambulatory Visit (HOSPITAL_BASED_OUTPATIENT_CLINIC_OR_DEPARTMENT_OTHER): Payer: BC Managed Care – PPO

## 2012-10-01 ENCOUNTER — Other Ambulatory Visit: Payer: Self-pay | Admitting: Emergency Medicine

## 2012-10-01 VITALS — BP 138/72 | HR 63 | Temp 98.1°F

## 2012-10-01 DIAGNOSIS — D638 Anemia in other chronic diseases classified elsewhere: Secondary | ICD-10-CM

## 2012-10-01 LAB — CBC WITH DIFFERENTIAL/PLATELET
Basophils Absolute: 0 10*3/uL (ref 0.0–0.1)
Eosinophils Absolute: 0.3 10*3/uL (ref 0.0–0.5)
HGB: 10.1 g/dL — ABNORMAL LOW (ref 11.6–15.9)
MCV: 92 fL (ref 79.5–101.0)
MONO%: 9.8 % (ref 0.0–14.0)
NEUT#: 3.3 10*3/uL (ref 1.5–6.5)
RBC: 3.49 10*6/uL — ABNORMAL LOW (ref 3.70–5.45)
RDW: 16.7 % — ABNORMAL HIGH (ref 11.2–14.5)
WBC: 6.3 10*3/uL (ref 3.9–10.3)
lymph#: 2.1 10*3/uL (ref 0.9–3.3)

## 2012-10-01 MED ORDER — DARBEPOETIN ALFA-POLYSORBATE 500 MCG/ML IJ SOLN
300.0000 ug | Freq: Once | INTRAMUSCULAR | Status: AC
  Administered 2012-10-01: 300 ug via SUBCUTANEOUS
  Filled 2012-10-01: qty 1

## 2012-10-06 ENCOUNTER — Other Ambulatory Visit: Payer: Self-pay | Admitting: Internal Medicine

## 2012-10-08 ENCOUNTER — Encounter: Payer: Self-pay | Admitting: Internal Medicine

## 2012-10-08 ENCOUNTER — Ambulatory Visit (INDEPENDENT_AMBULATORY_CARE_PROVIDER_SITE_OTHER): Payer: BC Managed Care – PPO | Admitting: Internal Medicine

## 2012-10-08 VITALS — BP 118/70 | HR 54 | Ht 67.5 in | Wt 154.0 lb

## 2012-10-08 DIAGNOSIS — D6859 Other primary thrombophilia: Secondary | ICD-10-CM

## 2012-10-08 DIAGNOSIS — I82622 Acute embolism and thrombosis of deep veins of left upper extremity: Secondary | ICD-10-CM

## 2012-10-08 DIAGNOSIS — I82609 Acute embolism and thrombosis of unspecified veins of unspecified upper extremity: Secondary | ICD-10-CM

## 2012-10-08 DIAGNOSIS — R161 Splenomegaly, not elsewhere classified: Secondary | ICD-10-CM | POA: Insufficient documentation

## 2012-10-08 DIAGNOSIS — K6389 Other specified diseases of intestine: Secondary | ICD-10-CM

## 2012-10-08 DIAGNOSIS — K912 Postsurgical malabsorption, not elsewhere classified: Secondary | ICD-10-CM

## 2012-10-08 HISTORY — DX: Acute embolism and thrombosis of deep veins of left upper extremity: I82.622

## 2012-10-08 MED ORDER — CIPROFLOXACIN HCL 500 MG PO TABS: 500.0000 mg | ORAL_TABLET | Freq: Two times a day (BID) | ORAL | Status: DC

## 2012-10-08 NOTE — Progress Notes (Signed)
Subjective:    Patient ID: [name] [name], female    DOB: 27-Mar-1951, 62 y.o.   MRN: [id number]  HPI Avriana is here with her husband, she was last seen in 2013. Since that time she has started [facility] treatment for her short bowel syndrome. She has been gaining weight and is on TPN 6-7 nights now. I just received a request from her dietitian to reduce to 5 days a week. She really feels like things are going well. He does need a refill on her Cipro which she uses for her bacterial overgrowth intermittently. Stools are a little bit more firm.  She had that her PICC line changed again and it was noted that she had a small thrombosis in the left brachial vein. She has been off her anticoagulation since earlier this year. I understand what she understands warfarin is contraindicated with the Gattex.   Allergies  Allergen Reactions  . Iohexol Itching    IVP Dye ok if taken with benadryl  . Penicillins Itching   Outpatient Prescriptions Prior to Visit  Medication Sig Dispense Refill  . ADULT TPN Inject 2,100 mLs into the vein continuous. Infuse over 10 hours with 1 hour ramp up and 1 hour ramp down. Total bag per day provides: 250g Dextrose 70%, 100g Travasol 10%, 45g Intralipids 30%, various electrolytes, MVI, TE, Zinc, selenium.      . carvedilol (COREG) 3.125 MG tablet Take 1 tablet (3.125 mg total) by mouth 2 (two) times daily with a meal.  60 tablet  3  . diphenhydrAMINE (BENADRYL) 25 mg capsule Take 25 mg by mouth every 6 (six) hours as needed. For itching/sleep.      . isosorbide dinitrate (ISORDIL) 10 MG tablet Take 1 tablet (10 mg total) by mouth daily.  30 tablet  6  . loperamide (IMODIUM A-D) 2 MG tablet Take 2 tablets 30 minutes prior to each meal  180 tablet  6  . Multiple Vitamin (MULTIVITAMIN WITH MINERALS) TABS Take 1 tablet by mouth daily.      Marland Kitchen omeprazole (PRILOSEC) 40 MG capsule Take 40 mg by mouth daily.      . Teduglutide, rDNA, (GATTEX) 5 MG KIT Inject 3.3 Units into the  skin daily.      . ursodiol (ACTIGALL) 300 MG capsule Take 300 mg by mouth 2 (two) times daily.      . Vitamin D, Ergocalciferol, (DRISDOL) 50000 UNITS CAPS Take 50,000 Units by mouth daily. Alternates every other day between 50,000 and 100,000       No facility-administered medications prior to visit.   Past Medical History  Diagnosis Date  . 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 Abnormal LFTs   . Vitamin D deficiency   . Personal history of colonic polyps   . Thrombophilia   . Diabetes mellitus     resolved after weight loss  . Obesity     prior to weight loss  . Allergic rhinosinusitis   . At risk for dental problems   . Fracture of left clavicle   . Osteoporosis   . Anemia of chronic disease   . Renal insufficiency   . Atypical nevus   . Pathologic fracture of neck of femur   . Pancytopenia 10/07/2011  . Small bowel ischemia   . Bacterial overgrowth syndrome   . Splenomegaly     By ultrasound  . 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  Small intestine surgery  2005    multiple with right colon resection for ischemia/infarct  . Cholecystectomy    . Orif proximal femoral fracture w/ itst nail system  03/2007    left, Dr. Simonne Come  . Esophagogastroduodenoscopy  01/22/2009    erosive esophagitis  . Colonoscopy  12/05/2005    internal hemorrhoids (for polyp surveillance)  . Colonoscopy  04/26/2012    Procedure: COLONOSCOPY;  Surgeon: Iva Boop, MD;  Location: [facility];  Service: Endoscopy;  Laterality: N/A;    Review of Systems Dentition remains in poor shape    Objective:   Physical Exam   General:  NAD Eyes:   anicteric Lungs:  clear Heart:  S1S2 no rubs, murmurs or gallops Abdomen:  soft and nontender, BS+ Ext:   no edema - PICC left arm - site ok    Data Reviewed:  Lab Results  Component Value Date   WBC 6.3 10/01/2012   HGB 10.1* 10/01/2012   HCT 32.1* 10/01/2012   MCV  92.0 10/01/2012   PLT 131* 10/01/2012          Wt Readings from Last 3 Encounters:  10/08/12 154 lb (69.854 kg)  08/23/12 147 lb 12.8 oz (67.042 kg)  04/29/12 147 lb 3.2 oz (66.769 kg)       Assessment & Plan:  Acquired short bowel syndrome   Improved on Gattex - reducing TPN down to 5 days/week  Gattex can increase growth and produce colon polyps - colonoscopy 1 year after starting is recommended - will place a colon recall for 05/2013.  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   I am concerned about recurrent thrombosis problems and possible injury. I think warfarin is a problem w/ Gattex due to possible increased absorption.    Given what she has been through in past and prior plans for lifelong anticoagulation I explained that I was concerned about serious problems from a recurrence, and she has a clot in  brachial vein now/recently.     Will ask Dr. Welton Flakes re: what her recommendations are - last note indicated a possible switch from Lotronex to Xarelto but nothing about stopping anticoagulation. Nancyjo told me she  informed Dr. Welton Flakes she was stopping anti-coagulation. - no record of that from my review.  OZ:[id number] Beverely Low, MD , Drue Second, MD, Britt Bolognese, MD

## 2012-10-08 NOTE — Patient Instructions (Addendum)
Today we have refilled your CIPRO and we want to see you again in 6 months or sooner if you need [place].   Thank you for choosing me and [facility].  Iva Boop, M.D., [facility]

## 2012-10-08 NOTE — Progress Notes (Signed)
Patient ID: [name] [name], female   DOB: 1951-03-02, 62 y.o.   MRN: [id number] Faxed office notes from 10/08/2012 to Dr. Britt Bolognese at [facility], fax # [phone].

## 2012-10-22 ENCOUNTER — Ambulatory Visit (HOSPITAL_BASED_OUTPATIENT_CLINIC_OR_DEPARTMENT_OTHER): Payer: BC Managed Care – PPO

## 2012-10-22 ENCOUNTER — Other Ambulatory Visit (HOSPITAL_BASED_OUTPATIENT_CLINIC_OR_DEPARTMENT_OTHER): Payer: BC Managed Care – PPO | Admitting: Lab

## 2012-10-22 ENCOUNTER — Other Ambulatory Visit: Payer: Self-pay | Admitting: Emergency Medicine

## 2012-10-22 VITALS — BP 132/61 | HR 59 | Temp 98.5°F

## 2012-10-22 DIAGNOSIS — K912 Postsurgical malabsorption, not elsewhere classified: Secondary | ICD-10-CM

## 2012-10-22 DIAGNOSIS — D638 Anemia in other chronic diseases classified elsewhere: Secondary | ICD-10-CM

## 2012-10-22 DIAGNOSIS — D6859 Other primary thrombophilia: Secondary | ICD-10-CM

## 2012-10-22 LAB — COMPREHENSIVE METABOLIC PANEL (CC13)
Albumin: 2.9 g/dL — ABNORMAL LOW (ref 3.5–5.0)
BUN: 33.4 mg/dL — ABNORMAL HIGH (ref 7.0–26.0)
CO2: 26 mEq/L (ref 22–29)
Calcium: 9 mg/dL (ref 8.4–10.4)
Chloride: 106 mEq/L (ref 98–107)
Creatinine: 1.2 mg/dL — ABNORMAL HIGH (ref 0.6–1.1)

## 2012-10-22 LAB — CBC WITH DIFFERENTIAL/PLATELET
Basophils Absolute: 0 10*3/uL (ref 0.0–0.1)
Eosinophils Absolute: 0.5 10*3/uL (ref 0.0–0.5)
HCT: 32.5 % — ABNORMAL LOW (ref 34.8–46.6)
HGB: 10.8 g/dL — ABNORMAL LOW (ref 11.6–15.9)
MONO#: 0.7 10*3/uL (ref 0.1–0.9)
NEUT#: 2.1 10*3/uL (ref 1.5–6.5)
NEUT%: 38.3 % — ABNORMAL LOW (ref 38.4–76.8)
WBC: 5.4 10*3/uL (ref 3.9–10.3)
lymph#: 2.1 10*3/uL (ref 0.9–3.3)

## 2012-10-22 MED ORDER — DARBEPOETIN ALFA-POLYSORBATE 300 MCG/0.6ML IJ SOLN
300.0000 ug | Freq: Once | INTRAMUSCULAR | Status: AC
  Administered 2012-10-22: 300 ug via SUBCUTANEOUS
  Filled 2012-10-22: qty 0.6

## 2012-10-25 ENCOUNTER — Telehealth: Payer: Self-pay | Admitting: Internal Medicine

## 2012-10-25 NOTE — Progress Notes (Signed)
Quick Note:  I wonder if this labe is excessive for [name] [name] - the home health people are monitoring her CMET - ? If this is coming from cancer [facility]? Not urgent but let's see all of the labs she is getting - not sure she needs so many of the same ______

## 2012-10-25 NOTE — Telephone Encounter (Signed)
They need verbal order to continue providing her TPN labs and nursing care.  They will send over the orders to be signed

## 2012-10-28 ENCOUNTER — Ambulatory Visit (HOSPITAL_BASED_OUTPATIENT_CLINIC_OR_DEPARTMENT_OTHER): Payer: BC Managed Care – PPO | Admitting: Oncology

## 2012-10-28 ENCOUNTER — Other Ambulatory Visit: Payer: BC Managed Care – PPO | Admitting: Lab

## 2012-10-28 ENCOUNTER — Telehealth: Payer: Self-pay | Admitting: Oncology

## 2012-10-28 ENCOUNTER — Other Ambulatory Visit: Payer: Self-pay | Admitting: Emergency Medicine

## 2012-10-28 VITALS — BP 148/79 | HR 59 | Temp 97.6°F | Resp 20 | Ht 67.5 in | Wt 152.3 lb

## 2012-10-28 DIAGNOSIS — D649 Anemia, unspecified: Secondary | ICD-10-CM

## 2012-10-28 NOTE — Patient Instructions (Addendum)
Continue aranesp injections every 4 weeks  I will see you 6 -8 months

## 2012-11-10 ENCOUNTER — Encounter: Payer: Self-pay | Admitting: Internal Medicine

## 2012-11-12 NOTE — Progress Notes (Signed)
OFFICE PROGRESS NOTE  CC  [name], MD 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 Do to malabsorption.  #2 anemia of chronic disease. Patient is on Aranesp injections every 3 weeks.  #3 history of thrombosis on Coumadin to try to keep her INR between 2 and 2.5.but  right now she is on Lovenox daily  CURRENT THERAPY: Aranesp injections  INTERVAL HISTORY: [name] [name] 62 y.o. female returns for followup visit today.She has no bleeding problems. She has proceeded to go back to getting her Aranesp injections she received an injection this past week. Remainder of the 10 point review of systems is unremarkable.  MEDICAL HISTORY: Past Medical History  Diagnosis Date  . 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 Abnormal LFTs   . Vitamin D deficiency   . Personal history of colonic polyps   . Thrombophilia   . Diabetes mellitus     resolved after weight loss  . Obesity     prior to weight loss  . Allergic rhinosinusitis   . At risk for dental problems   . Fracture of left clavicle   . Osteoporosis   . Anemia of chronic disease   . Renal insufficiency   . Atypical nevus   . Pathologic fracture of neck of femur   . Pancytopenia 10/07/2011  . Small bowel ischemia   . Bacterial overgrowth syndrome   . Splenomegaly     By ultrasound  . 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  MEDICATIONS:  Current Outpatient Prescriptions  Medication Sig Dispense Refill  . ADULT TPN Inject 2,100 mLs into the vein continuous. Infuse over 10 hours with 1 hour ramp up and 1 hour ramp down. Total bag per day provides: 250g Dextrose 70%, 100g Travasol 10%, 45g Intralipids 30%, various electrolytes, MVI, TE, Zinc, selenium.      . carvedilol (COREG) 3.125 MG  tablet Take 1 tablet (3.125 mg total) by mouth 2 (two) times daily with a meal.  60 tablet  3  . diphenhydrAMINE (BENADRYL) 25 mg capsule Take 25 mg by mouth every 6 (six) hours as needed. For itching/sleep.      . diphenoxylate-atropine (LOMOTIL) 2.5-0.025 MG per tablet       . isosorbide dinitrate (ISORDIL) 10 MG tablet Take 1 tablet (10 mg total) by mouth daily.  30 tablet  6  . Multiple Vitamin (MULTIVITAMIN WITH MINERALS) TABS Take 1 tablet by mouth daily.      Marland Kitchen omeprazole (PRILOSEC) 40 MG capsule Take 40 mg by mouth daily.      . Teduglutide, rDNA, (GATTEX) 5 MG KIT Inject 3.3 Units into the skin daily.      . ursodiol (ACTIGALL) 300 MG capsule Take 300 mg by mouth 2 (two) times daily.      . Vitamin D, Ergocalciferol, (DRISDOL) 50000 UNITS CAPS Take 50,000 Units by mouth daily. Alternates every other day between 50,000 and 100,000      . ciprofloxacin (CIPRO) 500 MG tablet Take 1 tablet (500 mg total) by mouth 2 (two) times daily.  28 tablet  6  . loperamide (IMODIUM A-D) 2 MG tablet Take 2 tablets 30 minutes prior to each meal  180 tablet  6  . ondansetron (ZOFRAN) 4 MG tablet  No current facility-administered medications for this visit.    SURGICAL HISTORY:  Past Surgical History  Procedure Laterality Date  . Small intestine surgery  2005    multiple with right colon resection for ischemia/infarct  . Cholecystectomy    . Orif proximal femoral fracture w/ itst nail system  03/2007    left, Dr. Simonne Come  . Esophagogastroduodenoscopy  01/22/2009    erosive esophagitis  . Colonoscopy  12/05/2005    internal hemorrhoids (for polyp surveillance)  . Colonoscopy  04/26/2012    Procedure: COLONOSCOPY;  Surgeon: Iva Boop, MD;  Location: [facility];  Service: Endoscopy;  Laterality: N/A;    REVIEW OF SYSTEMS:  Pertinent items are noted in HPI.     PHYSICAL EXAMINATION: Blood pressure 148/79, pulse 59, temperature 97.6 F (36.4 C), temperature source Oral, resp. rate  20, height 5' 7.5" (1.715 m), weight 152 lb 4.8 oz (69.083 kg). Body mass index is 23.49 kg/(m^2).  ECOG PERFORMANCE STATUS: 1 - Symptomatic but completely ambulatory   HEENT exam patient is edentulous EOMI PERRLA sclerae anicteric no conjunctival pallor oral mucosa is moist no thrush neck is supple no palpable adenopathy lungs are clear cardiovascular is regular rate rhythm abdomen soft nontender no HSM extremities no edema neuro is nonfocal  LABORATORY DATA: Lab Results  Component Value Date   WBC 5.4 10/22/2012   HGB 10.8* 10/22/2012   HCT 32.5* 10/22/2012   MCV 90.2 10/22/2012   PLT 109* 10/22/2012      Chemistry      Component Value Date/Time   NA 139 10/22/2012 1005   NA 136 09/17/2012 1010   NA 140 03/01/2009 1029   K 4.6 10/22/2012 1005   K 4.0 09/17/2012 1010   K 4.2 03/01/2009 1029   CL 106 10/22/2012 1005   CL 103 09/17/2012 1010   CL 104 03/01/2009 1029   CO2 26 10/22/2012 1005   CO2 25 09/17/2012 1010   CO2 25 03/01/2009 1029   BUN 33.4* 10/22/2012 1005   BUN 22 09/17/2012 1010   BUN 17 03/01/2009 1029   CREATININE 1.2* 10/22/2012 1005   CREATININE 0.99 09/17/2012 1010   CREATININE 0.5* 03/01/2009 1029      Component Value Date/Time   CALCIUM 9.0 10/22/2012 1005   CALCIUM 9.2 09/17/2012 1010   CALCIUM 8.7 03/01/2009 1029   ALKPHOS 77 10/22/2012 1005   ALKPHOS 148* 12/27/2011 0538   ALKPHOS 143* 03/01/2009 1029   AST 14 10/22/2012 1005   AST 40* 12/27/2011 0538   AST 46* 03/01/2009 1029   ALT 16 10/22/2012 1005   ALT 25 12/27/2011 0538   BILITOT 0.44 10/22/2012 1005   BILITOT 0.4 12/27/2011 0538   BILITOT 0.40 03/01/2009 1029       RADIOGRAPHIC STUDIES:  No results found.  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 She is now being seen at [facility] and has been able to wean off some of the TPN  #2 anemia of chronic disease secondary to malabsorption and multiple medical problems she is on Aranesp injections overall  tolerating it well.  #3 from the cytopenia likely multifactorial we are observing only.    PLAN:   #1 patient will continue to get Aranesp injections every 3 weeks.   #2 I will plan on seeing her back in 6 month's time for followup.   All questions were answered. The patient knows to call the clinic with any problems, questions or concerns. We can  certainly see the patient much sooner if necessary.  I spent 25 minutes counseling the patient face to face. The total time spent in the appointment was 30 minutes.    Drue Second, MD Medical/Oncology [facility] [phone] (beeper) [phone] (Office)

## 2012-11-16 ENCOUNTER — Telehealth: Payer: Self-pay | Admitting: Internal Medicine

## 2012-11-16 ENCOUNTER — Other Ambulatory Visit (INDEPENDENT_AMBULATORY_CARE_PROVIDER_SITE_OTHER): Payer: BC Managed Care – PPO

## 2012-11-16 DIAGNOSIS — Z789 Other specified health status: Secondary | ICD-10-CM

## 2012-11-16 DIAGNOSIS — K912 Postsurgical malabsorption, not elsewhere classified: Secondary | ICD-10-CM

## 2012-11-16 LAB — COMPREHENSIVE METABOLIC PANEL
ALT: 19 U/L (ref 0–35)
AST: 19 U/L (ref 0–37)
Albumin: 3.3 g/dL — ABNORMAL LOW (ref 3.5–5.2)
Calcium: 9 mg/dL (ref 8.4–10.5)
Chloride: 107 mEq/L (ref 96–112)
Potassium: 4.4 mEq/L (ref 3.5–5.1)
Sodium: 139 mEq/L (ref 135–145)

## 2012-11-16 LAB — CBC WITH DIFFERENTIAL/PLATELET
Basophils Relative: 0.4 % (ref 0.0–3.0)
Eosinophils Relative: 8 % — ABNORMAL HIGH (ref 0.0–5.0)
HCT: 34.8 % — ABNORMAL LOW (ref 36.0–46.0)
Lymphs Abs: 1.9 10*3/uL (ref 0.7–4.0)
MCV: 90.3 fl (ref 78.0–100.0)
Monocytes Absolute: 0.6 10*3/uL (ref 0.1–1.0)
Monocytes Relative: 10.8 % (ref 3.0–12.0)
Platelets: 108 10*3/uL — ABNORMAL LOW (ref 150.0–400.0)
RBC: 3.85 Mil/uL — ABNORMAL LOW (ref 3.87–5.11)
WBC: 5.7 10*3/uL (ref 4.5–10.5)

## 2012-11-16 LAB — TRIGLYCERIDES: Triglycerides: 76 mg/dL (ref 0.0–149.0)

## 2012-11-16 LAB — C-REACTIVE PROTEIN: CRP: 1.7 mg/dL (ref 0.5–20.0)

## 2012-11-16 NOTE — Progress Notes (Signed)
Quick Note:  Labs reviewed please forward to appropriate providers  Prealbumin pending ______

## 2012-11-16 NOTE — Telephone Encounter (Signed)
I spoke with Beth from [facility].  They will fax orders for labs.  Patient did not answer when I returned her call I will continue to try and reach her.

## 2012-11-16 NOTE — Telephone Encounter (Signed)
ok 

## 2012-11-16 NOTE — Telephone Encounter (Signed)
Orders placed for [name] [name] to come for lab work.  She is aware.  PICC flushes well and works for her TPN, just won't draw blood.  [name] [name] would prefer to come her for lab draws and not have HH draw her blood.  Standing orders are placed for q 2 weeks.  They anticipate stopping TPN in 3 weeks

## 2012-11-17 ENCOUNTER — Encounter: Payer: Self-pay | Admitting: Internal Medicine

## 2012-11-17 ENCOUNTER — Ambulatory Visit: Payer: BC Managed Care – PPO

## 2012-11-17 DIAGNOSIS — K912 Postsurgical malabsorption, not elsewhere classified: Secondary | ICD-10-CM

## 2012-11-17 LAB — PREALBUMIN: Prealbumin: 17.5 mg/dL (ref 17.0–34.0)

## 2012-11-17 LAB — PHOSPHORUS: Phosphorus: 4.5 mg/dL (ref 2.3–4.6)

## 2012-11-17 LAB — MAGNESIUM: Magnesium: 2 mg/dL (ref 1.5–2.5)

## 2012-11-17 NOTE — Progress Notes (Signed)
Quick Note:  Normal! Please forward appropriately ______

## 2012-11-17 NOTE — Progress Notes (Signed)
Patient ID: [name] [name], female   DOB: [birth year], 62 y.o.   MRN: [id number] Faxed labs from 11/16/12 to [facility] at fax # [phone] .  They needed these in order to mix the TPN/hydration.

## 2012-11-18 NOTE — Progress Notes (Signed)
Quick Note:  Please forward appropriately ______

## 2012-11-19 ENCOUNTER — Encounter: Payer: Self-pay | Admitting: Internal Medicine

## 2012-11-19 ENCOUNTER — Other Ambulatory Visit: Payer: BC Managed Care – PPO | Admitting: Lab

## 2012-11-19 ENCOUNTER — Other Ambulatory Visit (HOSPITAL_BASED_OUTPATIENT_CLINIC_OR_DEPARTMENT_OTHER): Payer: BC Managed Care – PPO | Admitting: Lab

## 2012-11-19 ENCOUNTER — Ambulatory Visit: Payer: BC Managed Care – PPO

## 2012-11-19 DIAGNOSIS — D649 Anemia, unspecified: Secondary | ICD-10-CM

## 2012-11-19 LAB — CBC WITH DIFFERENTIAL/PLATELET
BASO%: 0.7 % (ref 0.0–2.0)
EOS%: 6.7 % (ref 0.0–7.0)
HCT: 34.7 % — ABNORMAL LOW (ref 34.8–46.6)
HGB: 11.5 g/dL — ABNORMAL LOW (ref 11.6–15.9)
MCH: 29.7 pg (ref 25.1–34.0)
MCHC: 33.1 g/dL (ref 31.5–36.0)
MONO#: 1.3 10*3/uL — ABNORMAL HIGH (ref 0.1–0.9)
RDW: 15.2 % — ABNORMAL HIGH (ref 11.2–14.5)
WBC: 7.8 10*3/uL (ref 3.9–10.3)
lymph#: 2.4 10*3/uL (ref 0.9–3.3)

## 2012-11-19 NOTE — Progress Notes (Signed)
Patient ID: [name] [name], female   DOB: 1950-10-28, 62 y.o.   MRN: [id number] Faxed signed physician orders to [facility] # [phone], phone# [phone]. Send down to be scanned in.

## 2012-11-19 NOTE — Progress Notes (Signed)
No aranesp needed today, d/t hgb 11.5.  Pt aware of next appt.

## 2012-11-29 ENCOUNTER — Other Ambulatory Visit (INDEPENDENT_AMBULATORY_CARE_PROVIDER_SITE_OTHER): Payer: BC Managed Care – PPO

## 2012-11-29 DIAGNOSIS — Z789 Other specified health status: Secondary | ICD-10-CM

## 2012-11-29 DIAGNOSIS — K912 Postsurgical malabsorption, not elsewhere classified: Secondary | ICD-10-CM

## 2012-11-29 LAB — CBC WITH DIFFERENTIAL/PLATELET
Basophils Absolute: 0 10*3/uL (ref 0.0–0.1)
HCT: 35 % — ABNORMAL LOW (ref 36.0–46.0)
Hemoglobin: 11.5 g/dL — ABNORMAL LOW (ref 12.0–15.0)
Lymphs Abs: 2.1 10*3/uL (ref 0.7–4.0)
MCV: 90.5 fl (ref 78.0–100.0)
Monocytes Absolute: 0.4 10*3/uL (ref 0.1–1.0)
Monocytes Relative: 7.5 % (ref 3.0–12.0)
Neutro Abs: 2.4 10*3/uL (ref 1.4–7.7)
Platelets: 142 10*3/uL — ABNORMAL LOW (ref 150.0–400.0)
RDW: 15 % — ABNORMAL HIGH (ref 11.5–14.6)

## 2012-11-29 LAB — COMPREHENSIVE METABOLIC PANEL
ALT: 105 U/L — ABNORMAL HIGH (ref 0–35)
AST: 74 U/L — ABNORMAL HIGH (ref 0–37)
Albumin: 3.4 g/dL — ABNORMAL LOW (ref 3.5–5.2)
Alkaline Phosphatase: 125 U/L — ABNORMAL HIGH (ref 39–117)
Calcium: 9.2 mg/dL (ref 8.4–10.5)
Chloride: 109 mEq/L (ref 96–112)
Potassium: 3.9 mEq/L (ref 3.5–5.1)
Sodium: 138 mEq/L (ref 135–145)
Total Protein: 8.6 g/dL — ABNORMAL HIGH (ref 6.0–8.3)

## 2012-11-30 NOTE — Progress Notes (Signed)
Quick Note:  Labs look good Please forward as needed to appropriate care providers ______

## 2012-12-13 ENCOUNTER — Other Ambulatory Visit (INDEPENDENT_AMBULATORY_CARE_PROVIDER_SITE_OTHER): Payer: BC Managed Care – PPO

## 2012-12-13 DIAGNOSIS — Z789 Other specified health status: Secondary | ICD-10-CM

## 2012-12-13 DIAGNOSIS — K912 Postsurgical malabsorption, not elsewhere classified: Secondary | ICD-10-CM

## 2012-12-13 LAB — COMPREHENSIVE METABOLIC PANEL
ALT: 42 U/L — ABNORMAL HIGH (ref 0–35)
CO2: 27 mEq/L (ref 19–32)
Calcium: 9 mg/dL (ref 8.4–10.5)
Chloride: 106 mEq/L (ref 96–112)
Creatinine, Ser: 1.1 mg/dL (ref 0.4–1.2)
GFR: 52.29 mL/min — ABNORMAL LOW (ref >60.00)
Glucose, Bld: 75 mg/dL (ref 70–99)
Sodium: 138 mEq/L (ref 135–145)
Total Protein: 8.4 g/dL — ABNORMAL HIGH (ref 6.0–8.3)

## 2012-12-13 LAB — CBC WITH DIFFERENTIAL/PLATELET
Basophils Absolute: 0 10*3/uL (ref 0.0–0.1)
Lymphocytes Relative: 39.4 % (ref 12.0–46.0)
Monocytes Relative: 8.4 % (ref 3.0–12.0)
Neutrophils Relative %: 43.7 % (ref 43.0–77.0)
Platelets: 120 10*3/uL — ABNORMAL LOW (ref 150.0–400.0)
RDW: 16.2 % — ABNORMAL HIGH (ref 11.5–14.6)
WBC: 5.5 10*3/uL (ref 4.5–10.5)

## 2012-12-13 LAB — TRIGLYCERIDES: Triglycerides: 178 mg/dL — ABNORMAL HIGH (ref 0.0–149.0)

## 2012-12-13 LAB — BILIRUBIN, DIRECT: Bilirubin, Direct: 0.1 mg/dL (ref 0.0–0.3)

## 2012-12-13 LAB — C-REACTIVE PROTEIN: CRP: <0.5 mg/dL (ref 0.5–20.0)

## 2012-12-14 LAB — PREALBUMIN: Prealbumin: 18.5 mg/dL (ref 17.0–34.0)

## 2012-12-15 NOTE — Progress Notes (Signed)
Quick Note:  Labs look good Let her know Please forward to her TPN/home health provider ______

## 2012-12-16 ENCOUNTER — Telehealth: Payer: Self-pay

## 2012-12-16 DIAGNOSIS — K912 Postsurgical malabsorption, not elsewhere classified: Secondary | ICD-10-CM

## 2012-12-16 DIAGNOSIS — Z789 Other specified health status: Secondary | ICD-10-CM

## 2012-12-16 DIAGNOSIS — K90829 Short bowel syndrome, unspecified: Secondary | ICD-10-CM

## 2012-12-16 NOTE — Telephone Encounter (Signed)
[name] [name] phoned from [facility] requesting magnesium and phosphorus levels be drawn next blood draw.  Spoke to [place] in our [employer] blood to old to run these on.  Standing orders put in to have these done with her other standing orders.

## 2012-12-17 ENCOUNTER — Other Ambulatory Visit (HOSPITAL_BASED_OUTPATIENT_CLINIC_OR_DEPARTMENT_OTHER): Payer: BC Managed Care – PPO | Admitting: Lab

## 2012-12-17 ENCOUNTER — Ambulatory Visit: Payer: BC Managed Care – PPO

## 2012-12-17 DIAGNOSIS — D638 Anemia in other chronic diseases classified elsewhere: Secondary | ICD-10-CM

## 2012-12-17 DIAGNOSIS — D649 Anemia, unspecified: Secondary | ICD-10-CM

## 2012-12-17 LAB — CBC WITH DIFFERENTIAL/PLATELET
Basophils Absolute: 0.1 10*3/uL (ref 0.0–0.1)
EOS%: 8.2 % — ABNORMAL HIGH (ref 0.0–7.0)
Eosinophils Absolute: 0.5 10*3/uL (ref 0.0–0.5)
HGB: 11.4 g/dL — ABNORMAL LOW (ref 11.6–15.9)
MCH: 28 pg (ref 25.1–34.0)
NEUT#: 2.9 10*3/uL (ref 1.5–6.5)
RDW: 15.8 % — ABNORMAL HIGH (ref 11.2–14.5)
lymph#: 2.4 10*3/uL (ref 0.9–3.3)

## 2012-12-17 MED ORDER — DARBEPOETIN ALFA-POLYSORBATE 500 MCG/ML IJ SOLN: 300.0000 ug | Freq: Once | INTRAMUSCULAR | Status: DC

## 2012-12-20 NOTE — Progress Notes (Signed)
Quick Note:  Labs ok/stable Ask [name] [name] how she is doing..inhaled corticosteroids she off the TPN? ______

## 2012-12-21 NOTE — Progress Notes (Signed)
Quick Note:  OK The corticosteroid comment was a typo ______

## 2012-12-25 ENCOUNTER — Other Ambulatory Visit: Payer: Self-pay | Admitting: Internal Medicine

## 2012-12-27 ENCOUNTER — Other Ambulatory Visit (INDEPENDENT_AMBULATORY_CARE_PROVIDER_SITE_OTHER): Payer: BC Managed Care – PPO

## 2012-12-27 DIAGNOSIS — K912 Postsurgical malabsorption, not elsewhere classified: Secondary | ICD-10-CM

## 2012-12-27 DIAGNOSIS — Z789 Other specified health status: Secondary | ICD-10-CM

## 2012-12-27 DIAGNOSIS — K90829 Short bowel syndrome, unspecified: Secondary | ICD-10-CM

## 2012-12-27 LAB — MAGNESIUM: Magnesium: 1.7 mg/dL (ref 1.5–2.5)

## 2012-12-27 LAB — TRIGLYCERIDES: Triglycerides: 233 mg/dL — ABNORMAL HIGH (ref 0.0–149.0)

## 2012-12-27 LAB — CBC WITH DIFFERENTIAL/PLATELET
Basophils Absolute: 0 10*3/uL (ref 0.0–0.1)
Eosinophils Absolute: 0.6 10*3/uL (ref 0.0–0.7)
Eosinophils Relative: 8.5 % — ABNORMAL HIGH (ref 0.0–5.0)
Lymphs Abs: 2.5 10*3/uL (ref 0.7–4.0)
MCHC: 33.5 g/dL (ref 30.0–36.0)
MCV: 87.9 fl (ref 78.0–100.0)
Monocytes Absolute: 0.5 10*3/uL (ref 0.1–1.0)
Neutrophils Relative %: 44.4 % (ref 43.0–77.0)
Platelets: 125 10*3/uL — ABNORMAL LOW (ref 150.0–400.0)
RDW: 16.5 % — ABNORMAL HIGH (ref 11.5–14.6)
WBC: 6.5 10*3/uL (ref 4.5–10.5)

## 2012-12-27 LAB — COMPREHENSIVE METABOLIC PANEL
ALT: 44 U/L — ABNORMAL HIGH (ref 0–35)
Albumin: 3.4 g/dL — ABNORMAL LOW (ref 3.5–5.2)
Alkaline Phosphatase: 93 U/L (ref 39–117)
Glucose, Bld: 98 mg/dL (ref 70–99)
Potassium: 3.7 mEq/L (ref 3.5–5.1)
Sodium: 137 mEq/L (ref 135–145)
Total Protein: 8.4 g/dL — ABNORMAL HIGH (ref 6.0–8.3)

## 2012-12-27 LAB — BILIRUBIN, DIRECT: Bilirubin, Direct: 0.2 mg/dL (ref 0.0–0.3)

## 2012-12-27 LAB — C-REACTIVE PROTEIN: CRP: <0.5 mg/dL (ref 0.5–20.0)

## 2012-12-27 LAB — PHOSPHORUS: Phosphorus: 3.2 mg/dL (ref 2.3–4.6)

## 2012-12-27 NOTE — Progress Notes (Signed)
Quick Note:  Labs ok Please forward to home care and let patient know ______

## 2012-12-28 LAB — PREALBUMIN: Prealbumin: 18.5 mg/dL (ref 17.0–34.0)

## 2012-12-28 NOTE — Progress Notes (Signed)
Quick Note:  Stable - ok  ______

## 2013-01-11 ENCOUNTER — Other Ambulatory Visit (INDEPENDENT_AMBULATORY_CARE_PROVIDER_SITE_OTHER): Payer: BC Managed Care – PPO

## 2013-01-11 DIAGNOSIS — K912 Postsurgical malabsorption, not elsewhere classified: Secondary | ICD-10-CM

## 2013-01-11 DIAGNOSIS — Z789 Other specified health status: Secondary | ICD-10-CM

## 2013-01-11 LAB — CBC WITH DIFFERENTIAL/PLATELET
Basophils Relative: 0.5 % (ref 0.0–3.0)
Eosinophils Absolute: 0.3 10*3/uL (ref 0.0–0.7)
Hemoglobin: 10 g/dL — ABNORMAL LOW (ref 12.0–15.0)
Lymphocytes Relative: 31.1 % (ref 12.0–46.0)
MCHC: 33.8 g/dL (ref 30.0–36.0)
Neutro Abs: 3.2 10*3/uL (ref 1.4–7.7)
RBC: 3.31 Mil/uL — ABNORMAL LOW (ref 3.87–5.11)

## 2013-01-11 LAB — COMPREHENSIVE METABOLIC PANEL
AST: 37 U/L (ref 0–37)
Alkaline Phosphatase: 81 U/L (ref 39–117)
BUN: 26 mg/dL — ABNORMAL HIGH (ref 6–23)
Calcium: 8.9 mg/dL (ref 8.4–10.5)
Chloride: 105 mEq/L (ref 96–112)
Creatinine, Ser: 1 mg/dL (ref 0.4–1.2)

## 2013-01-11 LAB — BILIRUBIN, DIRECT: Bilirubin, Direct: 0.1 mg/dL (ref 0.0–0.3)

## 2013-01-11 LAB — MAGNESIUM: Magnesium: 2.1 mg/dL (ref 1.5–2.5)

## 2013-01-11 LAB — PHOSPHORUS: Phosphorus: 3.8 mg/dL (ref 2.3–4.6)

## 2013-01-12 NOTE — Progress Notes (Signed)
Quick Note:  Labs stable with slight decrease in prealbumin - a nutrition measure Explain to her that I don't think she needs the cancer center labs she is getting except the INR - she is getting CBC's there but the nutrition people are also doing so she could save money by stopping the cancer center CBC's  Forward to TPN managers  ______

## 2013-01-14 ENCOUNTER — Ambulatory Visit (HOSPITAL_BASED_OUTPATIENT_CLINIC_OR_DEPARTMENT_OTHER): Payer: BC Managed Care – PPO

## 2013-01-14 ENCOUNTER — Telehealth: Payer: Self-pay | Admitting: Family Medicine

## 2013-01-14 ENCOUNTER — Other Ambulatory Visit (HOSPITAL_BASED_OUTPATIENT_CLINIC_OR_DEPARTMENT_OTHER): Payer: BC Managed Care – PPO | Admitting: Lab

## 2013-01-14 VITALS — BP 122/64 | HR 57 | Temp 98.6°F

## 2013-01-14 DIAGNOSIS — D638 Anemia in other chronic diseases classified elsewhere: Secondary | ICD-10-CM

## 2013-01-14 DIAGNOSIS — D649 Anemia, unspecified: Secondary | ICD-10-CM

## 2013-01-14 LAB — CBC WITH DIFFERENTIAL/PLATELET
Eosinophils Absolute: 0.4 10*3/uL (ref 0.0–0.5)
HCT: 31 % — ABNORMAL LOW (ref 34.8–46.6)
HGB: 10.3 g/dL — ABNORMAL LOW (ref 11.6–15.9)
LYMPH%: 38.4 % (ref 14.0–49.7)
MONO#: 0.6 10*3/uL (ref 0.1–0.9)
NEUT#: 2.3 10*3/uL (ref 1.5–6.5)
NEUT%: 42.9 % (ref 38.4–76.8)
Platelets: 138 10*3/uL — ABNORMAL LOW (ref 145–400)
WBC: 5.4 10*3/uL (ref 3.9–10.3)
lymph#: 2.1 10*3/uL (ref 0.9–3.3)

## 2013-01-14 MED ORDER — DARBEPOETIN ALFA-POLYSORBATE 300 MCG/0.6ML IJ SOLN
300.0000 ug | Freq: Once | INTRAMUSCULAR | Status: AC
  Administered 2013-01-14: 300 ug via SUBCUTANEOUS
  Filled 2013-01-14: qty 0.6

## 2013-01-14 NOTE — Progress Notes (Signed)
Quick Note:  This CBC is ok and only 3 days after the labs she gets for TPN We should see if this one or other one can be stopped so not overdoing labs Am copying her hematologist also but please communicate with Dr. Milta Deiters nurse ______

## 2013-01-14 NOTE — Telephone Encounter (Signed)
Patient states that about 1 month ago she dropped off her FMLA paperwork and was told that it had been faxed to the requesting party. Patient says that it was never received and is asking that we re-send to Fax#: [phone]. Please advise.

## 2013-01-19 NOTE — Telephone Encounter (Signed)
Patient is calling back about this. States that she called earlier this week to check the status. No documentation. Patient says her time limit has ran out and this is very urgent. Please call patient once complete.

## 2013-01-20 ENCOUNTER — Other Ambulatory Visit: Payer: Self-pay | Admitting: *Deleted

## 2013-01-20 ENCOUNTER — Encounter: Payer: Self-pay | Admitting: *Deleted

## 2013-01-20 NOTE — Telephone Encounter (Signed)
Attempted to call pt regarding FMLA forms, voice mail not set up. Will try back later

## 2013-01-20 NOTE — Telephone Encounter (Signed)
Spoke with patient made aware that original FMLA forms were sent to be scanned and no longer in the office. Patient is to drop off forms again tomorrow morning for [place] to complete and fax again.

## 2013-01-24 ENCOUNTER — Other Ambulatory Visit (INDEPENDENT_AMBULATORY_CARE_PROVIDER_SITE_OTHER): Payer: BC Managed Care – PPO

## 2013-01-24 DIAGNOSIS — Z789 Other specified health status: Secondary | ICD-10-CM

## 2013-01-24 DIAGNOSIS — K912 Postsurgical malabsorption, not elsewhere classified: Secondary | ICD-10-CM

## 2013-01-24 LAB — CBC WITH DIFFERENTIAL/PLATELET
Basophils Absolute: 0 10*3/uL (ref 0.0–0.1)
Eosinophils Absolute: 0.3 10*3/uL (ref 0.0–0.7)
Hemoglobin: 10.7 g/dL — ABNORMAL LOW (ref 12.0–15.0)
Lymphocytes Relative: 42.5 % (ref 12.0–46.0)
MCHC: 33 g/dL (ref 30.0–36.0)
Monocytes Relative: 10.1 % (ref 3.0–12.0)
Neutro Abs: 1.7 10*3/uL (ref 1.4–7.7)
Neutrophils Relative %: 39.7 % — ABNORMAL LOW (ref 43.0–77.0)
RDW: 19.4 % — ABNORMAL HIGH (ref 11.5–14.6)

## 2013-01-24 LAB — COMPREHENSIVE METABOLIC PANEL
ALT: 36 U/L — ABNORMAL HIGH (ref 0–35)
AST: 29 U/L (ref 0–37)
Albumin: 3.3 g/dL — ABNORMAL LOW (ref 3.5–5.2)
BUN: 23 mg/dL (ref 6–23)
CO2: 25 mEq/L (ref 19–32)
Calcium: 8.8 mg/dL (ref 8.4–10.5)
Chloride: 107 mEq/L (ref 96–112)
Creatinine, Ser: 1.1 mg/dL (ref 0.4–1.2)
GFR: 51.21 mL/min — ABNORMAL LOW (ref >60.00)
Potassium: 3.8 mEq/L (ref 3.5–5.1)

## 2013-01-24 LAB — PHOSPHORUS: Phosphorus: 3.9 mg/dL (ref 2.3–4.6)

## 2013-01-24 LAB — TRIGLYCERIDES: Triglycerides: 189 mg/dL — ABNORMAL HIGH (ref 0.0–149.0)

## 2013-01-24 LAB — MAGNESIUM: Magnesium: 1.7 mg/dL (ref 1.5–2.5)

## 2013-01-24 LAB — BILIRUBIN, DIRECT: Bilirubin, Direct: 0.1 mg/dL (ref 0.0–0.3)

## 2013-01-25 LAB — PREALBUMIN: Prealbumin: 16.2 mg/dL — ABNORMAL LOW (ref 17.0–34.0)

## 2013-01-25 NOTE — Progress Notes (Signed)
Quick Note:  Labs stable please fax as appropriate ______

## 2013-01-27 ENCOUNTER — Other Ambulatory Visit: Payer: Self-pay | Admitting: Internal Medicine

## 2013-02-07 ENCOUNTER — Other Ambulatory Visit (INDEPENDENT_AMBULATORY_CARE_PROVIDER_SITE_OTHER): Payer: BC Managed Care – PPO

## 2013-02-07 ENCOUNTER — Other Ambulatory Visit: Payer: Self-pay

## 2013-02-07 DIAGNOSIS — K912 Postsurgical malabsorption, not elsewhere classified: Secondary | ICD-10-CM

## 2013-02-07 LAB — CBC WITH DIFFERENTIAL/PLATELET
Basophils Relative: 0.7 % (ref 0.0–3.0)
Eosinophils Relative: 6.8 % — ABNORMAL HIGH (ref 0.0–5.0)
Hemoglobin: 10.7 g/dL — ABNORMAL LOW (ref 12.0–15.0)
MCHC: 32.8 g/dL (ref 30.0–36.0)
MCV: 93.9 fl (ref 78.0–100.0)
Monocytes Absolute: 0.4 10*3/uL (ref 0.1–1.0)
Monocytes Relative: 9.7 % (ref 3.0–12.0)
Neutro Abs: 1.9 10*3/uL (ref 1.4–7.7)
Neutrophils Relative %: 42.4 % — ABNORMAL LOW (ref 43.0–77.0)
RBC: 3.49 Mil/uL — ABNORMAL LOW (ref 3.87–5.11)
WBC: 4.5 10*3/uL (ref 4.5–10.5)

## 2013-02-07 LAB — COMPREHENSIVE METABOLIC PANEL
Albumin: 3.3 g/dL — ABNORMAL LOW (ref 3.5–5.2)
Alkaline Phosphatase: 69 U/L (ref 39–117)
BUN: 21 mg/dL (ref 6–23)
CO2: 25 mEq/L (ref 19–32)
Calcium: 8.7 mg/dL (ref 8.4–10.5)
Chloride: 110 mEq/L (ref 96–112)
Creatinine, Ser: 1.2 mg/dL (ref 0.4–1.2)
GFR: 50.19 mL/min — ABNORMAL LOW (ref >60.00)
Glucose, Bld: 93 mg/dL (ref 70–99)
Potassium: 3.8 mEq/L (ref 3.5–5.1)
Sodium: 139 mEq/L (ref 135–145)
Total Protein: 7.6 g/dL (ref 6.0–8.3)

## 2013-02-07 LAB — C-REACTIVE PROTEIN: CRP: <0.5 mg/dL (ref 0.5–20.0)

## 2013-02-07 LAB — MAGNESIUM: Magnesium: 1.7 mg/dL (ref 1.5–2.5)

## 2013-02-09 ENCOUNTER — Ambulatory Visit (INDEPENDENT_AMBULATORY_CARE_PROVIDER_SITE_OTHER): Payer: BC Managed Care – PPO

## 2013-02-09 DIAGNOSIS — Z23 Encounter for immunization: Secondary | ICD-10-CM

## 2013-02-10 NOTE — Progress Notes (Signed)
Quick Note:  Labs ok/stable Please forward to home care people ______

## 2013-02-11 ENCOUNTER — Other Ambulatory Visit (HOSPITAL_BASED_OUTPATIENT_CLINIC_OR_DEPARTMENT_OTHER): Payer: BC Managed Care – PPO | Admitting: Lab

## 2013-02-11 ENCOUNTER — Ambulatory Visit: Payer: BC Managed Care – PPO

## 2013-02-11 DIAGNOSIS — D638 Anemia in other chronic diseases classified elsewhere: Secondary | ICD-10-CM

## 2013-02-11 DIAGNOSIS — D649 Anemia, unspecified: Secondary | ICD-10-CM

## 2013-02-11 LAB — CBC WITH DIFFERENTIAL/PLATELET
Basophils Absolute: 0.1 10*3/uL (ref 0.0–0.1)
EOS%: 7.5 % — ABNORMAL HIGH (ref 0.0–7.0)
HCT: 35.4 % (ref 34.8–46.6)
HGB: 11.3 g/dL — ABNORMAL LOW (ref 11.6–15.9)
LYMPH%: 40.8 % (ref 14.0–49.7)
MCH: 30.4 pg (ref 25.1–34.0)
MCV: 94.9 fL (ref 79.5–101.0)
MONO%: 10.1 % (ref 0.0–14.0)
NEUT%: 40.6 % (ref 38.4–76.8)
Platelets: 113 10*3/uL — ABNORMAL LOW (ref 145–400)

## 2013-02-11 LAB — IRON AND TIBC CHCC
%SAT: 17 % — ABNORMAL LOW (ref 21–57)
Iron: 47 ug/dL (ref 41–142)
TIBC: 283 ug/dL (ref 236–444)
UIBC: 235 ug/dL (ref 120–384)

## 2013-02-11 LAB — FERRITIN CHCC: Ferritin: 259 ng/ml (ref 9–269)

## 2013-02-11 MED ORDER — DARBEPOETIN ALFA-POLYSORBATE 500 MCG/ML IJ SOLN: 300.0000 ug | Freq: Once | INTRAMUSCULAR | Status: DC

## 2013-02-28 ENCOUNTER — Other Ambulatory Visit: Payer: Self-pay

## 2013-02-28 DIAGNOSIS — K912 Postsurgical malabsorption, not elsewhere classified: Secondary | ICD-10-CM

## 2013-03-01 ENCOUNTER — Ambulatory Visit (INDEPENDENT_AMBULATORY_CARE_PROVIDER_SITE_OTHER): Payer: BC Managed Care – PPO

## 2013-03-01 DIAGNOSIS — K912 Postsurgical malabsorption, not elsewhere classified: Secondary | ICD-10-CM

## 2013-03-01 LAB — CBC WITH DIFFERENTIAL/PLATELET
Eosinophils Absolute: 0.3 10*3/uL (ref 0.0–0.7)
Eosinophils Relative: 4.5 % (ref 0.0–5.0)
MCHC: 33.4 g/dL (ref 30.0–36.0)
MCV: 92.3 fl (ref 78.0–100.0)
Monocytes Absolute: 0.7 10*3/uL (ref 0.1–1.0)
Monocytes Relative: 10.1 % (ref 3.0–12.0)
Neutrophils Relative %: 51.8 % (ref 43.0–77.0)
Platelets: 101 10*3/uL — ABNORMAL LOW (ref 150.0–400.0)
WBC: 6.8 10*3/uL (ref 4.5–10.5)

## 2013-03-01 LAB — VITAMIN B12: Vitamin B-12: 467 pg/mL (ref 211–911)

## 2013-03-01 LAB — BILIRUBIN, DIRECT: Bilirubin, Direct: 0.1 mg/dL (ref 0.0–0.3)

## 2013-03-01 LAB — C-REACTIVE PROTEIN: CRP: <0.5 mg/dL (ref 0.5–20.0)

## 2013-03-01 LAB — COMPREHENSIVE METABOLIC PANEL
ALT: 27 U/L (ref 0–35)
AST: 26 U/L (ref 0–37)
Albumin: 3.2 g/dL — ABNORMAL LOW (ref 3.5–5.2)
Alkaline Phosphatase: 75 U/L (ref 39–117)
CO2: 18 mEq/L — ABNORMAL LOW (ref 19–32)
Glucose, Bld: 117 mg/dL — ABNORMAL HIGH (ref 70–99)
Potassium: 3.7 mEq/L (ref 3.5–5.1)
Sodium: 139 mEq/L (ref 135–145)
Total Protein: 7.4 g/dL (ref 6.0–8.3)

## 2013-03-01 NOTE — Progress Notes (Signed)
Quick Note:  Labs stable/ok Please forward to home agency ______

## 2013-03-02 LAB — VITAMIN D 25 HYDROXY (VIT D DEFICIENCY, FRACTURES): Vit D, 25-Hydroxy: 15 ng/mL — ABNORMAL LOW (ref 30–89)

## 2013-03-02 LAB — PREALBUMIN: Prealbumin: 16.4 mg/dL — ABNORMAL LOW (ref 17.0–34.0)

## 2013-03-02 LAB — CERULOPLASMIN: Ceruloplasmin: 22 mg/dL (ref 20–60)

## 2013-03-02 LAB — MANGANESE

## 2013-03-03 LAB — VITAMIN E

## 2013-03-11 ENCOUNTER — Ambulatory Visit: Payer: BC Managed Care – PPO

## 2013-03-11 ENCOUNTER — Encounter (INDEPENDENT_AMBULATORY_CARE_PROVIDER_SITE_OTHER): Payer: Self-pay

## 2013-03-11 ENCOUNTER — Other Ambulatory Visit (HOSPITAL_BASED_OUTPATIENT_CLINIC_OR_DEPARTMENT_OTHER): Payer: BC Managed Care – PPO | Admitting: Lab

## 2013-03-11 ENCOUNTER — Other Ambulatory Visit: Payer: Self-pay

## 2013-03-11 DIAGNOSIS — D649 Anemia, unspecified: Secondary | ICD-10-CM

## 2013-03-11 DIAGNOSIS — D638 Anemia in other chronic diseases classified elsewhere: Secondary | ICD-10-CM

## 2013-03-11 DIAGNOSIS — K912 Postsurgical malabsorption, not elsewhere classified: Secondary | ICD-10-CM

## 2013-03-11 LAB — CBC WITH DIFFERENTIAL/PLATELET
Basophils Absolute: 0 10*3/uL (ref 0.0–0.1)
EOS%: 7.4 % — ABNORMAL HIGH (ref 0.0–7.0)
Eosinophils Absolute: 0.4 10*3/uL (ref 0.0–0.5)
HCT: 35.4 % (ref 34.8–46.6)
HGB: 11.7 g/dL (ref 11.6–15.9)
LYMPH%: 47.3 % (ref 14.0–49.7)
MONO#: 0.5 10*3/uL (ref 0.1–0.9)
NEUT#: 1.8 10*3/uL (ref 1.5–6.5)
Platelets: 110 10*3/uL — ABNORMAL LOW (ref 145–400)
RBC: 3.8 10*6/uL (ref 3.70–5.45)
WBC: 5.2 10*3/uL (ref 3.9–10.3)

## 2013-03-11 MED ORDER — DARBEPOETIN ALFA-POLYSORBATE 500 MCG/ML IJ SOLN: 300.0000 ug | Freq: Once | INTRAMUSCULAR | Status: DC

## 2013-03-11 NOTE — Progress Notes (Signed)
Quick Note:  Please let nutrition team know certain labs not done - need redrawn I guess? Make sure not charged again Forward resukts we have  ______

## 2013-03-21 ENCOUNTER — Other Ambulatory Visit (INDEPENDENT_AMBULATORY_CARE_PROVIDER_SITE_OTHER): Payer: BC Managed Care – PPO

## 2013-03-21 DIAGNOSIS — K912 Postsurgical malabsorption, not elsewhere classified: Secondary | ICD-10-CM

## 2013-03-21 LAB — CBC WITH DIFFERENTIAL/PLATELET
Basophils Relative: 0.6 % (ref 0.0–3.0)
Eosinophils Absolute: 0.5 10*3/uL (ref 0.0–0.7)
HCT: 32.3 % — ABNORMAL LOW (ref 36.0–46.0)
Hemoglobin: 11 g/dL — ABNORMAL LOW (ref 12.0–15.0)
Lymphs Abs: 1.8 10*3/uL (ref 0.7–4.0)
MCHC: 34 g/dL (ref 30.0–36.0)
MCV: 90.8 fl (ref 78.0–100.0)
Monocytes Absolute: 0.4 10*3/uL (ref 0.1–1.0)
Neutro Abs: 1.9 10*3/uL (ref 1.4–7.7)
Neutrophils Relative %: 41.3 % — ABNORMAL LOW (ref 43.0–77.0)
RBC: 3.56 Mil/uL — ABNORMAL LOW (ref 3.87–5.11)

## 2013-03-21 LAB — COMPREHENSIVE METABOLIC PANEL
AST: 20 U/L (ref 0–37)
Albumin: 3.2 g/dL — ABNORMAL LOW (ref 3.5–5.2)
Alkaline Phosphatase: 75 U/L (ref 39–117)
BUN: 18 mg/dL (ref 6–23)
Creatinine, Ser: 1.1 mg/dL (ref 0.4–1.2)
Potassium: 3.9 mEq/L (ref 3.5–5.1)
Total Bilirubin: 0.7 mg/dL (ref 0.3–1.2)

## 2013-03-21 LAB — MAGNESIUM: Magnesium: 1.7 mg/dL (ref 1.5–2.5)

## 2013-03-21 LAB — C-REACTIVE PROTEIN: CRP: <0.5 mg/dL (ref 0.5–20.0)

## 2013-03-21 NOTE — Progress Notes (Signed)
Quick Note:  Labs ok Forward to home agency ______

## 2013-03-22 LAB — PREALBUMIN: Prealbumin: 17.4 mg/dL (ref 17.0–34.0)

## 2013-03-23 LAB — VITAMIN E
Gamma-Tocopherol (Vit E): 3 mg/L (ref <=4.3)
Vitamin E (Alpha Tocopherol): 10 mg/L (ref 5.7–19.9)

## 2013-03-28 NOTE — Progress Notes (Signed)
Quick Note:  Please forward to appropriate home tPN person ______

## 2013-03-29 ENCOUNTER — Other Ambulatory Visit: Payer: Self-pay | Admitting: Family Medicine

## 2013-03-29 ENCOUNTER — Other Ambulatory Visit: Payer: Self-pay | Admitting: Internal Medicine

## 2013-03-29 LAB — MANGANESE: Manganese, Blood: 0.6 mcg/L (ref <1.2)

## 2013-03-29 NOTE — Telephone Encounter (Signed)
Med filled.  

## 2013-04-08 ENCOUNTER — Other Ambulatory Visit: Payer: BC Managed Care – PPO | Admitting: Lab

## 2013-04-08 ENCOUNTER — Ambulatory Visit: Payer: BC Managed Care – PPO

## 2013-04-12 ENCOUNTER — Ambulatory Visit (HOSPITAL_BASED_OUTPATIENT_CLINIC_OR_DEPARTMENT_OTHER): Payer: BC Managed Care – PPO

## 2013-04-12 ENCOUNTER — Encounter (INDEPENDENT_AMBULATORY_CARE_PROVIDER_SITE_OTHER): Payer: Self-pay

## 2013-04-12 ENCOUNTER — Other Ambulatory Visit (HOSPITAL_BASED_OUTPATIENT_CLINIC_OR_DEPARTMENT_OTHER): Payer: BC Managed Care – PPO | Admitting: Lab

## 2013-04-12 VITALS — BP 104/58 | HR 61 | Temp 98.2°F

## 2013-04-12 DIAGNOSIS — D638 Anemia in other chronic diseases classified elsewhere: Secondary | ICD-10-CM

## 2013-04-12 DIAGNOSIS — D649 Anemia, unspecified: Secondary | ICD-10-CM

## 2013-04-12 LAB — CBC WITH DIFFERENTIAL/PLATELET
BASO%: 0.4 % (ref 0.0–2.0)
EOS%: 2.2 % (ref 0.0–7.0)
Eosinophils Absolute: 0.1 10*3/uL (ref 0.0–0.5)
LYMPH%: 21.1 % (ref 14.0–49.7)
MCHC: 32.6 g/dL (ref 31.5–36.0)
MCV: 94.2 fL (ref 79.5–101.0)
MONO#: 0.8 10*3/uL (ref 0.1–0.9)
MONO%: 11.6 % (ref 0.0–14.0)
Platelets: 74 10*3/uL — ABNORMAL LOW (ref 145–400)
RBC: 3.31 10*6/uL — ABNORMAL LOW (ref 3.70–5.45)
RDW: 14.7 % — ABNORMAL HIGH (ref 11.2–14.5)
WBC: 6.7 10*3/uL (ref 3.9–10.3)
lymph#: 1.4 10*3/uL (ref 0.9–3.3)

## 2013-04-12 MED ORDER — DARBEPOETIN ALFA-POLYSORBATE 500 MCG/ML IJ SOLN
300.0000 ug | Freq: Once | INTRAMUSCULAR | Status: AC
  Administered 2013-04-12: 300 ug via SUBCUTANEOUS
  Filled 2013-04-12: qty 1

## 2013-04-13 ENCOUNTER — Other Ambulatory Visit: Payer: Self-pay

## 2013-04-13 DIAGNOSIS — K912 Postsurgical malabsorption, not elsewhere classified: Secondary | ICD-10-CM

## 2013-04-14 ENCOUNTER — Ambulatory Visit (INDEPENDENT_AMBULATORY_CARE_PROVIDER_SITE_OTHER): Payer: BC Managed Care – PPO

## 2013-04-14 DIAGNOSIS — K912 Postsurgical malabsorption, not elsewhere classified: Secondary | ICD-10-CM

## 2013-04-14 LAB — COMPREHENSIVE METABOLIC PANEL
AST: 16 U/L (ref 0–37)
Alkaline Phosphatase: 67 U/L (ref 39–117)
CO2: 23 mEq/L (ref 19–32)
Glucose, Bld: 95 mg/dL (ref 70–99)
Sodium: 141 mEq/L (ref 135–145)
Total Bilirubin: 0.5 mg/dL (ref 0.3–1.2)
Total Protein: 7.3 g/dL (ref 6.0–8.3)

## 2013-04-14 LAB — CBC WITH DIFFERENTIAL/PLATELET
Eosinophils Relative: 7.8 % — ABNORMAL HIGH (ref 0.0–5.0)
HCT: 29.3 % — ABNORMAL LOW (ref 36.0–46.0)
Lymphocytes Relative: 27.4 % (ref 12.0–46.0)
Lymphs Abs: 1.1 10*3/uL (ref 0.7–4.0)
MCHC: 33.2 g/dL (ref 30.0–36.0)
MCV: 91.8 fl (ref 78.0–100.0)
Monocytes Absolute: 0.7 10*3/uL (ref 0.1–1.0)
Neutrophils Relative %: 47.7 % (ref 43.0–77.0)
Platelets: 61 10*3/uL — ABNORMAL LOW (ref 150.0–400.0)
RDW: 15 % — ABNORMAL HIGH (ref 11.5–14.6)

## 2013-04-14 LAB — TRIGLYCERIDES: Triglycerides: 99 mg/dL (ref 0.0–149.0)

## 2013-04-14 LAB — MAGNESIUM: Magnesium: 2.1 mg/dL (ref 1.5–2.5)

## 2013-04-14 LAB — PHOSPHORUS: Phosphorus: 3.8 mg/dL (ref 2.3–4.6)

## 2013-04-14 LAB — C-REACTIVE PROTEIN: CRP: 11.5 mg/dL (ref 0.5–20.0)

## 2013-04-15 LAB — CHROMIUM LEVEL: Chromium, Plasma: 1.3 ug/L (ref 0.1–2.1)

## 2013-05-01 ENCOUNTER — Other Ambulatory Visit: Payer: Self-pay | Admitting: Family Medicine

## 2013-05-02 NOTE — Telephone Encounter (Signed)
Med filled.  

## 2013-05-06 ENCOUNTER — Other Ambulatory Visit (HOSPITAL_BASED_OUTPATIENT_CLINIC_OR_DEPARTMENT_OTHER): Payer: BC Managed Care – PPO

## 2013-05-06 ENCOUNTER — Ambulatory Visit (HOSPITAL_BASED_OUTPATIENT_CLINIC_OR_DEPARTMENT_OTHER): Payer: BC Managed Care – PPO

## 2013-05-06 VITALS — BP 124/50 | HR 55 | Temp 98.6°F

## 2013-05-06 DIAGNOSIS — D649 Anemia, unspecified: Secondary | ICD-10-CM

## 2013-05-06 DIAGNOSIS — D638 Anemia in other chronic diseases classified elsewhere: Secondary | ICD-10-CM

## 2013-05-06 LAB — CBC WITH DIFFERENTIAL/PLATELET
Basophils Absolute: 0 10*3/uL (ref 0.0–0.1)
EOS%: 5.9 % (ref 0.0–7.0)
Eosinophils Absolute: 0.3 10*3/uL (ref 0.0–0.5)
HCT: 34.8 % (ref 34.8–46.6)
HGB: 10.7 g/dL — ABNORMAL LOW (ref 11.6–15.9)
LYMPH%: 34.3 % (ref 14.0–49.7)
MCH: 29.9 pg (ref 25.1–34.0)
MONO#: 0.5 10*3/uL (ref 0.1–0.9)
NEUT#: 2.7 10*3/uL (ref 1.5–6.5)
Platelets: 105 10*3/uL — ABNORMAL LOW (ref 145–400)
RBC: 3.58 10*6/uL — ABNORMAL LOW (ref 3.70–5.45)
WBC: 5.5 10*3/uL (ref 3.9–10.3)

## 2013-05-06 LAB — CBC WITH DIFFERENTIAL
Eos: 6 % — ABNORMAL HIGH (ref 0–5)
Eosinophils Absolute: 0.3 10*3/uL (ref 0.0–0.4)
HCT: 31.6 % — ABNORMAL LOW (ref 34.0–46.6)
Lymphocytes Absolute: 2.2 10*3/uL (ref 0.7–3.1)
MCH: 28.9 pg (ref 26.6–33.0)
MCHC: 32 g/dL (ref 31.5–35.7)
MCV: 90 fL (ref 79–97)
Monocytes: 14 % — ABNORMAL HIGH (ref 4–12)
Neutrophils Absolute: 1.8 10*3/uL (ref 1.4–7.0)
Platelets: 151 10*3/uL — ABNORMAL LOW (ref 155–379)
RBC: 3.5 x10E6/uL — ABNORMAL LOW (ref 3.77–5.28)
RDW: 16.5 % — ABNORMAL HIGH (ref 12.3–15.4)
WBC: 5 10*3/uL (ref 3.4–10.8)

## 2013-05-06 MED ORDER — DARBEPOETIN ALFA-POLYSORBATE 300 MCG/0.6ML IJ SOLN
300.0000 ug | Freq: Once | INTRAMUSCULAR | Status: AC
  Administered 2013-05-06: 300 ug via SUBCUTANEOUS
  Filled 2013-05-06: qty 0.6

## 2013-05-09 ENCOUNTER — Other Ambulatory Visit (INDEPENDENT_AMBULATORY_CARE_PROVIDER_SITE_OTHER): Payer: BC Managed Care – PPO

## 2013-05-09 DIAGNOSIS — K912 Postsurgical malabsorption, not elsewhere classified: Secondary | ICD-10-CM

## 2013-05-09 LAB — COMPREHENSIVE METABOLIC PANEL
ALT: 23 U/L (ref 0–35)
AST: 19 U/L (ref 0–37)
Alkaline Phosphatase: 88 U/L (ref 39–117)
BUN: 14 mg/dL (ref 6–23)
CO2: 19 mEq/L (ref 19–32)
Creatinine, Ser: 1.1 mg/dL (ref 0.4–1.2)
GFR: 56.26 mL/min — ABNORMAL LOW (ref >60.00)
Sodium: 139 mEq/L (ref 135–145)
Total Bilirubin: 0.6 mg/dL (ref 0.3–1.2)
Total Protein: 7.5 g/dL (ref 6.0–8.3)

## 2013-05-09 LAB — CBC WITH DIFFERENTIAL/PLATELET
Basophils Absolute: 0 10*3/uL (ref 0.0–0.1)
Eosinophils Absolute: 0.4 10*3/uL (ref 0.0–0.7)
Eosinophils Relative: 8.1 % — ABNORMAL HIGH (ref 0.0–5.0)
HCT: 32.6 % — ABNORMAL LOW (ref 36.0–46.0)
Hemoglobin: 10.4 g/dL — ABNORMAL LOW (ref 12.0–15.0)
Lymphs Abs: 1.9 10*3/uL (ref 0.7–4.0)
MCV: 94.7 fl (ref 78.0–100.0)
Monocytes Absolute: 0.6 10*3/uL (ref 0.1–1.0)
Neutro Abs: 2 10*3/uL (ref 1.4–7.7)
Platelets: 100 10*3/uL — ABNORMAL LOW (ref 150.0–400.0)
RDW: 16.2 % — ABNORMAL HIGH (ref 11.5–14.6)

## 2013-05-09 LAB — TRIGLYCERIDES: Triglycerides: 158 mg/dL — ABNORMAL HIGH (ref 0.0–149.0)

## 2013-05-09 LAB — PHOSPHORUS: Phosphorus: 3.4 mg/dL (ref 2.3–4.6)

## 2013-05-09 LAB — PREALBUMIN: Prealbumin: 11.8 mg/dL — ABNORMAL LOW (ref 17.0–34.0)

## 2013-05-09 LAB — C-REACTIVE PROTEIN: CRP: 0.8 mg/dL (ref 0.5–20.0)

## 2013-05-10 NOTE — Progress Notes (Signed)
Quick Note:  Please forward labs ______

## 2013-05-30 ENCOUNTER — Other Ambulatory Visit: Payer: Self-pay | Admitting: Internal Medicine

## 2013-05-30 ENCOUNTER — Other Ambulatory Visit: Payer: Self-pay | Admitting: Family Medicine

## 2013-05-30 ENCOUNTER — Other Ambulatory Visit (INDEPENDENT_AMBULATORY_CARE_PROVIDER_SITE_OTHER): Payer: BC Managed Care – PPO

## 2013-05-30 DIAGNOSIS — K912 Postsurgical malabsorption, not elsewhere classified: Secondary | ICD-10-CM

## 2013-05-30 LAB — COMPREHENSIVE METABOLIC PANEL
ALK PHOS: 85 U/L (ref 39–117)
ALT: 25 U/L (ref 0–35)
AST: 20 U/L (ref 0–37)
Albumin: 3.3 g/dL — ABNORMAL LOW (ref 3.5–5.2)
BILIRUBIN TOTAL: 0.5 mg/dL (ref 0.3–1.2)
BUN: 18 mg/dL (ref 6–23)
CO2: 23 mEq/L (ref 19–32)
Calcium: 8.7 mg/dL (ref 8.4–10.5)
Chloride: 108 mEq/L (ref 96–112)
Creatinine, Ser: 1.1 mg/dL (ref 0.4–1.2)
GFR: 52.21 mL/min — AB (ref >60.00)
Glucose, Bld: 91 mg/dL (ref 70–99)
Potassium: 3.8 mEq/L (ref 3.5–5.1)
SODIUM: 137 meq/L (ref 135–145)
Total Protein: 7.9 g/dL (ref 6.0–8.3)

## 2013-05-30 LAB — CBC WITH DIFFERENTIAL/PLATELET
Basophils Absolute: 0 10*3/uL (ref 0.0–0.1)
Basophils Relative: 0.3 % (ref 0.0–3.0)
EOS PCT: 6.4 % — AB (ref 0.0–5.0)
Eosinophils Absolute: 0.5 10*3/uL (ref 0.0–0.7)
HEMATOCRIT: 36.8 % (ref 36.0–46.0)
HEMOGLOBIN: 12.1 g/dL (ref 12.0–15.0)
LYMPHS ABS: 2.1 10*3/uL (ref 0.7–4.0)
Lymphocytes Relative: 29 % (ref 12.0–46.0)
MCHC: 32.8 g/dL (ref 30.0–36.0)
MCV: 93.4 fl (ref 78.0–100.0)
MONO ABS: 0.6 10*3/uL (ref 0.1–1.0)
Monocytes Relative: 8.6 % (ref 3.0–12.0)
NEUTROS ABS: 4.1 10*3/uL (ref 1.4–7.7)
Neutrophils Relative %: 55.7 % (ref 43.0–77.0)
PLATELETS: 113 10*3/uL — AB (ref 150.0–400.0)
RBC: 3.95 Mil/uL (ref 3.87–5.11)
RDW: 15.3 % — AB (ref 11.5–14.6)
WBC: 7.4 10*3/uL (ref 4.5–10.5)

## 2013-05-30 LAB — PREALBUMIN: Prealbumin: 16.2 mg/dL — ABNORMAL LOW (ref 17.0–34.0)

## 2013-05-30 LAB — PHOSPHORUS: Phosphorus: 3.7 mg/dL (ref 2.3–4.6)

## 2013-05-30 LAB — MAGNESIUM: Magnesium: 1.4 mg/dL — ABNORMAL LOW (ref 1.5–2.5)

## 2013-05-30 LAB — C-REACTIVE PROTEIN

## 2013-05-30 LAB — TRIGLYCERIDES: Triglycerides: 213 mg/dL — ABNORMAL HIGH (ref 0.0–149.0)

## 2013-05-30 LAB — BILIRUBIN, DIRECT: Bilirubin, Direct: 0.1 mg/dL (ref 0.0–0.3)

## 2013-05-30 NOTE — Progress Notes (Signed)
Quick Note:  Reviewed - please forward to agency ______

## 2013-05-30 NOTE — Progress Notes (Signed)
Quick Note:  Please forward ______ 

## 2013-06-03 ENCOUNTER — Ambulatory Visit: Payer: BC Managed Care – PPO

## 2013-06-03 ENCOUNTER — Other Ambulatory Visit: Payer: BC Managed Care – PPO

## 2013-06-27 ENCOUNTER — Other Ambulatory Visit (INDEPENDENT_AMBULATORY_CARE_PROVIDER_SITE_OTHER): Payer: BC Managed Care – PPO

## 2013-06-27 DIAGNOSIS — K912 Postsurgical malabsorption, not elsewhere classified: Secondary | ICD-10-CM

## 2013-06-27 LAB — COMPREHENSIVE METABOLIC PANEL
ALBUMIN: 3.4 g/dL — AB (ref 3.5–5.2)
ALT: 23 U/L (ref 0–35)
AST: 19 U/L (ref 0–37)
Alkaline Phosphatase: 82 U/L (ref 39–117)
BUN: 19 mg/dL (ref 6–23)
CALCIUM: 8.8 mg/dL (ref 8.4–10.5)
CHLORIDE: 111 meq/L (ref 96–112)
CO2: 23 mEq/L (ref 19–32)
CREATININE: 1.1 mg/dL (ref 0.4–1.2)
GFR: 55.02 mL/min — AB (ref >60.00)
GLUCOSE: 96 mg/dL (ref 70–99)
POTASSIUM: 3.4 meq/L — AB (ref 3.5–5.1)
Sodium: 139 mEq/L (ref 135–145)
Total Bilirubin: 0.7 mg/dL (ref 0.3–1.2)
Total Protein: 7.8 g/dL (ref 6.0–8.3)

## 2013-06-27 LAB — CBC WITH DIFFERENTIAL/PLATELET
BASOS PCT: 0.5 % (ref 0.0–3.0)
Basophils Absolute: 0 10*3/uL (ref 0.0–0.1)
EOS PCT: 8.4 % — AB (ref 0.0–5.0)
Eosinophils Absolute: 0.5 10*3/uL (ref 0.0–0.7)
HCT: 36.9 % (ref 36.0–46.0)
Hemoglobin: 11.9 g/dL — ABNORMAL LOW (ref 12.0–15.0)
LYMPHS PCT: 38.1 % (ref 12.0–46.0)
Lymphs Abs: 2.3 10*3/uL (ref 0.7–4.0)
MCHC: 32.3 g/dL (ref 30.0–36.0)
MCV: 94.5 fl (ref 78.0–100.0)
Monocytes Absolute: 0.6 10*3/uL (ref 0.1–1.0)
Monocytes Relative: 9.3 % (ref 3.0–12.0)
Neutro Abs: 2.6 10*3/uL (ref 1.4–7.7)
Neutrophils Relative %: 43.7 % (ref 43.0–77.0)
Platelets: 103 10*3/uL — ABNORMAL LOW (ref 150.0–400.0)
RBC: 3.9 Mil/uL (ref 3.87–5.11)
RDW: 14.7 % — ABNORMAL HIGH (ref 11.5–14.6)
WBC: 6 10*3/uL (ref 4.5–10.5)

## 2013-06-27 LAB — PREALBUMIN: Prealbumin: 20.1 mg/dL (ref 17.0–34.0)

## 2013-06-27 LAB — TRIGLYCERIDES: Triglycerides: 220 mg/dL — ABNORMAL HIGH (ref 0.0–149.0)

## 2013-06-27 LAB — PHOSPHORUS: Phosphorus: 3.7 mg/dL (ref 2.3–4.6)

## 2013-06-27 LAB — MAGNESIUM: Magnesium: 1.5 mg/dL (ref 1.5–2.5)

## 2013-06-27 LAB — C-REACTIVE PROTEIN

## 2013-06-27 LAB — BILIRUBIN, DIRECT: Bilirubin, Direct: 0.1 mg/dL (ref 0.0–0.3)

## 2013-06-28 ENCOUNTER — Other Ambulatory Visit: Payer: Self-pay | Admitting: Family Medicine

## 2013-06-28 NOTE — Progress Notes (Signed)
Quick Note:  Labs stable/ok Forward to home monitoring agency ______

## 2013-06-29 NOTE — Telephone Encounter (Signed)
Med filled.  

## 2013-07-01 ENCOUNTER — Telehealth: Payer: Self-pay | Admitting: *Deleted

## 2013-07-01 ENCOUNTER — Ambulatory Visit: Payer: BC Managed Care – PPO

## 2013-07-01 ENCOUNTER — Encounter: Payer: Self-pay | Admitting: Oncology

## 2013-07-01 ENCOUNTER — Other Ambulatory Visit (HOSPITAL_BASED_OUTPATIENT_CLINIC_OR_DEPARTMENT_OTHER): Payer: BC Managed Care – PPO

## 2013-07-01 ENCOUNTER — Ambulatory Visit (HOSPITAL_BASED_OUTPATIENT_CLINIC_OR_DEPARTMENT_OTHER): Payer: BC Managed Care – PPO | Admitting: Oncology

## 2013-07-01 DIAGNOSIS — D638 Anemia in other chronic diseases classified elsewhere: Secondary | ICD-10-CM

## 2013-07-01 DIAGNOSIS — D696 Thrombocytopenia, unspecified: Secondary | ICD-10-CM

## 2013-07-01 DIAGNOSIS — K912 Postsurgical malabsorption, not elsewhere classified: Secondary | ICD-10-CM

## 2013-07-01 DIAGNOSIS — D649 Anemia, unspecified: Secondary | ICD-10-CM

## 2013-07-01 DIAGNOSIS — D6859 Other primary thrombophilia: Secondary | ICD-10-CM

## 2013-07-01 LAB — CBC WITH DIFFERENTIAL/PLATELET
BASO%: 1.4 % (ref 0.0–2.0)
BASOS ABS: 0.1 10*3/uL (ref 0.0–0.1)
EOS%: 8.8 % — AB (ref 0.0–7.0)
Eosinophils Absolute: 0.5 10*3/uL (ref 0.0–0.5)
HCT: 35.8 % (ref 34.8–46.6)
HEMOGLOBIN: 11.5 g/dL — AB (ref 11.6–15.9)
LYMPH%: 41 % (ref 14.0–49.7)
MCH: 30.1 pg (ref 25.1–34.0)
MCHC: 32.2 g/dL (ref 31.5–36.0)
MCV: 93.7 fL (ref 79.5–101.0)
MONO#: 0.5 10*3/uL (ref 0.1–0.9)
MONO%: 8.7 % (ref 0.0–14.0)
NEUT#: 2.3 10*3/uL (ref 1.5–6.5)
NEUT%: 40.1 % (ref 38.4–76.8)
PLATELETS: 101 10*3/uL — AB (ref 145–400)
RBC: 3.83 10*6/uL (ref 3.70–5.45)
RDW: 14.8 % — AB (ref 11.2–14.5)
WBC: 5.6 10*3/uL (ref 3.9–10.3)
lymph#: 2.3 10*3/uL (ref 0.9–3.3)

## 2013-07-01 MED ORDER — DARBEPOETIN ALFA-POLYSORBATE 500 MCG/ML IJ SOLN: 300.0000 ug | Freq: Once | INTRAMUSCULAR | Status: DC

## 2013-07-01 NOTE — Telephone Encounter (Signed)
appts made and printed...td 

## 2013-07-03 ENCOUNTER — Encounter: Payer: Self-pay | Admitting: Oncology

## 2013-07-03 NOTE — Progress Notes (Signed)
OFFICE PROGRESS NOTE  CC  [name], MD 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  #2 anemia of chronic disease. Patient is on Aranesp injections every 3 weeks.  #3 history of thrombosis on Coumadin to try to keep her INR between 2 and 2.5.but  right now she is on Lovenox daily  CURRENT THERAPY: Aranesp injections q 4 weeks  INTERVAL HISTORY: [name] [name] 63 y.o. female returns for followup visit today.She has no bleeding problems. She has proceeded to go back to getting her Aranesp injections she received an injection this past week. Remainder of the 10 point review of systems is unremarkable.  MEDICAL HISTORY: Past Medical History  Diagnosis Date  . 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 Abnormal LFTs   . Vitamin D deficiency   . Personal history of colonic polyps   . Thrombophilia   . Diabetes mellitus     resolved after weight loss  . Obesity     prior to weight loss  . Allergic rhinosinusitis   . At risk for dental problems   . Fracture of left clavicle   . Osteoporosis   . Anemia of chronic disease   . Renal insufficiency   . Atypical nevus   . Pathologic fracture of neck of femur   . Pancytopenia 10/07/2011  . Small bowel ischemia   . Bacterial overgrowth syndrome   . Splenomegaly     By ultrasound  . 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  MEDICATIONS:  Current Outpatient Prescriptions  Medication Sig Dispense Refill  . ADULT TPN Inject 2,100 mLs into the vein continuous. Infuse over 10 hours with 1 hour ramp up and 1 hour ramp down. Total bag per day provides: 250g Dextrose 70%, 100g Travasol 10%, 45g Intralipids 30%, various electrolytes, MVI, TE, Zinc, selenium.      . carvedilol (COREG)  3.125 MG tablet Take 1 tablet (3.125 mg total) by mouth 2 (two) times daily with a meal.  60 tablet  3  . ciprofloxacin (CIPRO) 500 MG tablet Take 1 tablet (500 mg total) by mouth 2 (two) times daily.  28 tablet  6  . diphenhydrAMINE (BENADRYL) 25 mg capsule Take 25 mg by mouth every 6 (six) hours as needed. For itching/sleep.      . diphenoxylate-atropine (LOMOTIL) 2.5-0.025 MG per tablet       . isosorbide dinitrate (ISORDIL) 10 MG tablet TAKE 1 TABLET BY MOUTH EVERY DAY  30 tablet  0  . loperamide (IMODIUM A-D) 2 MG tablet Take 2 tablets 30 minutes prior to each meal  180 tablet  6  . Multiple Vitamin (MULTIVITAMIN WITH MINERALS) TABS Take 1 tablet by mouth daily.      Marland Kitchen omeprazole (PRILOSEC) 40 MG capsule Take 40 mg by mouth daily.      Marland Kitchen omeprazole (PRILOSEC) 40 MG capsule TAKE ONE CAPSULE BY MOUTH TWICE DAILY AT 10 AM AND 5 PM  60 capsule  5  . ondansetron (ZOFRAN) 4 MG tablet       . Teduglutide, rDNA, (GATTEX) 5 MG KIT Inject 3.3 Units into the skin daily.      . Vitamin D, Ergocalciferol, (DRISDOL) 50000 UNITS CAPS capsule TAKE 1 CAPSULE BY MOUTH EVERY OTHER  DAY ALTERNATING WITH 2 CAPSULES BY MOUTH EVERY OTHER DAY  135 capsule  0   No current facility-administered medications for this visit.    SURGICAL HISTORY:  Past Surgical History  Procedure Laterality Date  . Small intestine surgery  2005    multiple with right colon resection for ischemia/infarct  . Cholecystectomy    . Orif proximal femoral fracture w/ itst nail system  03/2007    left, Dr. Shellia Carwin  . Esophagogastroduodenoscopy  01/22/2009    erosive esophagitis  . Colonoscopy  12/05/2005    internal hemorrhoids (for polyp surveillance)  . Colonoscopy  04/26/2012    Procedure: COLONOSCOPY;  Surgeon: Gatha Mayer, MD;  Location: [facility];  Service: Endoscopy;  Laterality: N/A;    REVIEW OF SYSTEMS:  Pertinent items are noted in HPI.     PHYSICAL EXAMINATION: There were no vitals taken for this visit. There is  no weight on file to calculate BMI.  ECOG PERFORMANCE STATUS: 1 - Symptomatic but completely ambulatory   HEENT exam patient is edentulous EOMI PERRLA sclerae anicteric no conjunctival pallor oral mucosa is moist no thrush neck is supple no palpable adenopathy lungs are clear cardiovascular is regular rate rhythm abdomen soft nontender no HSM extremities no edema neuro is nonfocal  LABORATORY DATA: Lab Results  Component Value Date   WBC 5.6 07/01/2013   HGB 11.5* 07/01/2013   HCT 35.8 07/01/2013   MCV 93.7 07/01/2013   PLT 101* 07/01/2013      Chemistry      Component Value Date/Time   NA 139 06/27/2013 0926   NA 139 10/22/2012 1005   NA 140 03/01/2009 1029   K 3.4* 06/27/2013 0926   K 4.6 10/22/2012 1005   K 4.2 03/01/2009 1029   CL 111 06/27/2013 0926   CL 106 10/22/2012 1005   CL 104 03/01/2009 1029   CO2 23 06/27/2013 0926   CO2 26 10/22/2012 1005   CO2 25 03/01/2009 1029   BUN 19 06/27/2013 0926   BUN 33.4* 10/22/2012 1005   BUN 17 03/01/2009 1029   CREATININE 1.1 06/27/2013 0926   CREATININE 1.2* 10/22/2012 1005   CREATININE 0.5* 03/01/2009 1029      Component Value Date/Time   CALCIUM 8.8 06/27/2013 0926   CALCIUM 9.0 10/22/2012 1005   CALCIUM 8.7 03/01/2009 1029   ALKPHOS 82 06/27/2013 0926   ALKPHOS 77 10/22/2012 1005   ALKPHOS 143* 03/01/2009 1029   AST 19 06/27/2013 0926   AST 14 10/22/2012 1005   AST 46* 03/01/2009 1029   ALT 23 06/27/2013 0926   ALT 16 10/22/2012 1005   ALT 49* 03/01/2009 1029   BILITOT 0.7 06/27/2013 0926   BILITOT 0.44 10/22/2012 1005   BILITOT 0.40 03/01/2009 1029       RADIOGRAPHIC STUDIES:  No results found.  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 She is now being seen at [facility] and has been able to wean off some of the TPN  #2 anemia of chronic disease: secondary to malabsorption and multiple medical problems she is on Aranesp injections overall tolerating it well.  #3  Thrombocytopenia: likely multifactorial we are observing only.    PLAN:   1. Patient blood work looks good. Her hemoglobin is adequate and she will not receive aranesp injection today  #2 patient will continue to get Aranesp injections every 4 weeks. She will also need a cbc at the time of visits for injections. We  will also continue to monitor her iron levels q 6 months and give her parenteral iron as needed  #3 I will plan on seeing her back in 6 month's time for followup.   All questions were answered. The patient knows to call the clinic with any problems, questions or concerns. We can certainly see the patient much sooner if necessary.  I spent 15 minutes counseling the patient face to face. The total time spent in the appointment was 20 minutes.    Marcy Panning, MD Medical/Oncology [facility] [phone] (beeper) [phone] (Office)

## 2013-07-18 ENCOUNTER — Other Ambulatory Visit (INDEPENDENT_AMBULATORY_CARE_PROVIDER_SITE_OTHER): Payer: BC Managed Care – PPO

## 2013-07-18 DIAGNOSIS — K912 Postsurgical malabsorption, not elsewhere classified: Secondary | ICD-10-CM

## 2013-07-18 LAB — COMPREHENSIVE METABOLIC PANEL
ALBUMIN: 3.3 g/dL — AB (ref 3.5–5.2)
ALT: 22 U/L (ref 0–35)
AST: 19 U/L (ref 0–37)
Alkaline Phosphatase: 73 U/L (ref 39–117)
BUN: 21 mg/dL (ref 6–23)
CO2: 27 mEq/L (ref 19–32)
CREATININE: 1 mg/dL (ref 0.4–1.2)
Calcium: 8.7 mg/dL (ref 8.4–10.5)
Chloride: 109 mEq/L (ref 96–112)
GFR: 57.48 mL/min — AB (ref >60.00)
GLUCOSE: 94 mg/dL (ref 70–99)
Potassium: 3.7 mEq/L (ref 3.5–5.1)
SODIUM: 141 meq/L (ref 135–145)
Total Bilirubin: 0.6 mg/dL (ref 0.3–1.2)
Total Protein: 7.5 g/dL (ref 6.0–8.3)

## 2013-07-18 LAB — CBC WITH DIFFERENTIAL/PLATELET
BASOS ABS: 0 10*3/uL (ref 0.0–0.1)
BASOS PCT: 0.6 % (ref 0.0–3.0)
EOS ABS: 0.2 10*3/uL (ref 0.0–0.7)
Eosinophils Relative: 5.7 % — ABNORMAL HIGH (ref 0.0–5.0)
HCT: 31.7 % — ABNORMAL LOW (ref 36.0–46.0)
Hemoglobin: 10.5 g/dL — ABNORMAL LOW (ref 12.0–15.0)
LYMPHS PCT: 36.4 % (ref 12.0–46.0)
Lymphs Abs: 1.5 10*3/uL (ref 0.7–4.0)
MCHC: 33 g/dL (ref 30.0–36.0)
MCV: 91.4 fl (ref 78.0–100.0)
Monocytes Absolute: 0.5 10*3/uL (ref 0.1–1.0)
Monocytes Relative: 12.7 % — ABNORMAL HIGH (ref 3.0–12.0)
Neutro Abs: 1.8 10*3/uL (ref 1.4–7.7)
Neutrophils Relative %: 44.6 % (ref 43.0–77.0)
Platelets: 90 10*3/uL — ABNORMAL LOW (ref 150.0–400.0)
RBC: 3.47 Mil/uL — AB (ref 3.87–5.11)
RDW: 15.1 % — ABNORMAL HIGH (ref 11.5–14.6)
WBC: 4 10*3/uL — ABNORMAL LOW (ref 4.5–10.5)

## 2013-07-18 LAB — MAGNESIUM: Magnesium: 1.7 mg/dL (ref 1.5–2.5)

## 2013-07-18 LAB — TRIGLYCERIDES: Triglycerides: 147 mg/dL (ref 0.0–149.0)

## 2013-07-18 LAB — BILIRUBIN, DIRECT: Bilirubin, Direct: 0.1 mg/dL (ref 0.0–0.3)

## 2013-07-18 LAB — C-REACTIVE PROTEIN: CRP: 0.6 mg/dL (ref 0.5–20.0)

## 2013-07-18 LAB — PHOSPHORUS: PHOSPHORUS: 4.3 mg/dL (ref 2.3–4.6)

## 2013-07-19 LAB — PREALBUMIN: Prealbumin: 15.8 mg/dL — ABNORMAL LOW (ref 17.0–34.0)

## 2013-07-20 NOTE — Progress Notes (Signed)
Quick Note:  Please forward to TPN people ______

## 2013-07-21 ENCOUNTER — Telehealth: Payer: Self-pay | Admitting: Internal Medicine

## 2013-07-21 MED ORDER — DIPHENOXYLATE-ATROPINE 2.5-0.025 MG PO TABS: ORAL_TABLET | ORAL | Status: DC

## 2013-07-21 MED ORDER — CIPROFLOXACIN HCL 500 MG PO TABS: 500.0000 mg | ORAL_TABLET | Freq: Two times a day (BID) | ORAL | Status: DC

## 2013-07-21 NOTE — Telephone Encounter (Signed)
Spoke to patient who is asking for refills on the Cipro that she takes intermittently  for bacterial overgrowth and also her Lomotil.  Per Dr. Carlean Purl these refills are ok.  Lomotil rx printed and faxed to the [facility] per patient request.

## 2013-07-26 ENCOUNTER — Other Ambulatory Visit: Payer: Self-pay

## 2013-07-26 MED ORDER — TEDUGLUTIDE (RDNA) 5 MG ~~LOC~~ KIT: 3.3000 [IU] | PACK | Freq: Every day | SUBCUTANEOUS | Status: DC

## 2013-07-26 NOTE — Telephone Encounter (Signed)
Ok to refill per Dr Gessner. 

## 2013-07-29 ENCOUNTER — Other Ambulatory Visit (HOSPITAL_BASED_OUTPATIENT_CLINIC_OR_DEPARTMENT_OTHER): Payer: BC Managed Care – PPO

## 2013-07-29 ENCOUNTER — Ambulatory Visit (HOSPITAL_BASED_OUTPATIENT_CLINIC_OR_DEPARTMENT_OTHER): Payer: BC Managed Care – PPO

## 2013-07-29 VITALS — BP 113/58 | HR 60 | Temp 98.4°F

## 2013-07-29 DIAGNOSIS — D638 Anemia in other chronic diseases classified elsewhere: Secondary | ICD-10-CM

## 2013-07-29 DIAGNOSIS — D649 Anemia, unspecified: Secondary | ICD-10-CM

## 2013-07-29 LAB — CBC WITH DIFFERENTIAL/PLATELET
BASO%: 0.8 % (ref 0.0–2.0)
Basophils Absolute: 0 10*3/uL (ref 0.0–0.1)
EOS ABS: 0.3 10*3/uL (ref 0.0–0.5)
EOS%: 5.6 % (ref 0.0–7.0)
HEMATOCRIT: 33.7 % — AB (ref 34.8–46.6)
HGB: 10.7 g/dL — ABNORMAL LOW (ref 11.6–15.9)
LYMPH%: 44.9 % (ref 14.0–49.7)
MCH: 29.9 pg (ref 25.1–34.0)
MCHC: 31.8 g/dL (ref 31.5–36.0)
MCV: 94 fL (ref 79.5–101.0)
MONO#: 0.5 10*3/uL (ref 0.1–0.9)
MONO%: 8.2 % (ref 0.0–14.0)
NEUT#: 2.5 10*3/uL (ref 1.5–6.5)
NEUT%: 40.5 % (ref 38.4–76.8)
Platelets: 127 10*3/uL — ABNORMAL LOW (ref 145–400)
RBC: 3.59 10*6/uL — ABNORMAL LOW (ref 3.70–5.45)
RDW: 15.8 % — ABNORMAL HIGH (ref 11.2–14.5)
WBC: 6.1 10*3/uL (ref 3.9–10.3)
lymph#: 2.7 10*3/uL (ref 0.9–3.3)

## 2013-07-29 MED ORDER — DARBEPOETIN ALFA-POLYSORBATE 300 MCG/0.6ML IJ SOLN
300.0000 ug | Freq: Once | INTRAMUSCULAR | Status: AC
  Administered 2013-07-29: 300 ug via SUBCUTANEOUS
  Filled 2013-07-29: qty 0.6

## 2013-07-29 NOTE — Patient Instructions (Signed)
Darbepoetin Alfa injection What is this medicine? DARBEPOETIN ALFA (dar be POE e tin AL fa) helps your body make more red blood cells. It is used to treat anemia caused by chronic kidney failure and chemotherapy. This medicine may be used for other purposes; ask your health care provider or pharmacist if you have questions. COMMON BRAND NAME(S): Aranesp What should I tell my health care provider before I take this medicine? They need to know if you have any of these conditions: -blood clotting disorders or history of blood clots -cancer patient not on chemotherapy -cystic fibrosis -heart disease, such as angina, heart failure, or a history of a heart attack -hemoglobin level of 12 g/dL or greater -high blood pressure -low levels of folate, iron, or vitamin B12 -seizures -an unusual or allergic reaction to darbepoetin, erythropoietin, albumin, hamster proteins, latex, other medicines, foods, dyes, or preservatives -pregnant or trying to get pregnant -breast-feeding How should I use this medicine? This medicine is for injection into a vein or under the skin. It is usually given by a health care professional in a hospital or clinic setting. If you get this medicine at home, you will be taught how to prepare and give this medicine. Do not shake the solution before you withdraw a dose. Use exactly as directed. Take your medicine at regular intervals. Do not take your medicine more often than directed. It is important that you put your used needles and syringes in a special sharps container. Do not put them in a trash can. If you do not have a sharps container, call your pharmacist or healthcare provider to get one. Talk to your pediatrician regarding the use of this medicine in children. While this medicine may be used in children as young as 1 year for selected conditions, precautions do apply. Overdosage: If you think you have taken too much of this medicine contact a poison control center or  emergency room at once. NOTE: This medicine is only for you. Do not share this medicine with others. What if I miss a dose? If you miss a dose, take it as soon as you can. If it is almost time for your next dose, take only that dose. Do not take double or extra doses. What may interact with this medicine? Do not take this medicine with any of the following medications: -epoetin alfa This list may not describe all possible interactions. Give your health care provider a list of all the medicines, herbs, non-prescription drugs, or dietary supplements you use. Also tell them if you smoke, drink alcohol, or use illegal drugs. Some items may interact with your medicine. What should I watch for while using this medicine? Visit your prescriber or health care professional for regular checks on your progress and for the needed blood tests and blood pressure measurements. It is especially important for the doctor to make sure your hemoglobin level is in the desired range, to limit the risk of potential side effects and to give you the best benefit. Keep all appointments for any recommended tests. Check your blood pressure as directed. Ask your doctor what your blood pressure should be and when you should contact him or her. As your body makes more red blood cells, you may need to take iron, folic acid, or vitamin B supplements. Ask your doctor or health care provider which products are right for you. If you have kidney disease continue dietary restrictions, even though this medication can make you feel better. Talk with your doctor or health   care professional about the foods you eat and the vitamins that you take. What side effects may I notice from receiving this medicine? Side effects that you should report to your doctor or health care professional as soon as possible: -allergic reactions like skin rash, itching or hives, swelling of the face, lips, or tongue -breathing problems -changes in vision -chest  pain -confusion, trouble speaking or understanding -feeling faint or lightheaded, falls -high blood pressure -muscle aches or pains -pain, swelling, warmth in the leg -rapid weight gain -severe headaches -sudden numbness or weakness of the face, arm or leg -trouble walking, dizziness, loss of balance or coordination -seizures (convulsions) -swelling of the ankles, feet, hands -unusually weak or tired Side effects that usually do not require medical attention (report to your doctor or health care professional if they continue or are bothersome): -diarrhea -fever, chills (flu-like symptoms) -headaches -nausea, vomiting -redness, stinging, or swelling at site where injected This list may not describe all possible side effects. Call your doctor for medical advice about side effects. You may report side effects to FDA at 1-800-FDA-1088. Where should I keep my medicine? Keep out of the reach of children. Store in a refrigerator between 2 and 8 degrees C (36 and 46 degrees F). Do not freeze. Do not shake. Throw away any unused portion if using a single-dose vial. Throw away any unused medicine after the expiration date. NOTE: This sheet is a summary. It may not cover all possible information. If you have questions about this medicine, talk to your doctor, pharmacist, or health care provider.  2014, Elsevier/Gold Standard. (2008-04-11 10:23:57)  

## 2013-08-02 ENCOUNTER — Other Ambulatory Visit: Payer: Self-pay | Admitting: Family Medicine

## 2013-08-02 NOTE — Telephone Encounter (Signed)
Med filled.  

## 2013-08-08 ENCOUNTER — Other Ambulatory Visit (INDEPENDENT_AMBULATORY_CARE_PROVIDER_SITE_OTHER): Payer: BC Managed Care – PPO

## 2013-08-08 DIAGNOSIS — K912 Postsurgical malabsorption, not elsewhere classified: Secondary | ICD-10-CM

## 2013-08-08 LAB — COMPREHENSIVE METABOLIC PANEL
ALK PHOS: 74 U/L (ref 39–117)
ALT: 22 U/L (ref 0–35)
AST: 14 U/L (ref 0–37)
Albumin: 3.5 g/dL (ref 3.5–5.2)
BUN: 19 mg/dL (ref 6–23)
CO2: 24 mEq/L (ref 19–32)
Calcium: 8.8 mg/dL (ref 8.4–10.5)
Chloride: 107 mEq/L (ref 96–112)
Creatinine, Ser: 1 mg/dL (ref 0.4–1.2)
GFR: 60.87 mL/min (ref >60.00)
Glucose, Bld: 92 mg/dL (ref 70–99)
POTASSIUM: 3.5 meq/L (ref 3.5–5.1)
Sodium: 137 mEq/L (ref 135–145)
Total Bilirubin: 0.5 mg/dL (ref 0.3–1.2)
Total Protein: 7.5 g/dL (ref 6.0–8.3)

## 2013-08-08 LAB — MAGNESIUM: Magnesium: 1.7 mg/dL (ref 1.5–2.5)

## 2013-08-08 LAB — PREALBUMIN: Prealbumin: 16 mg/dL — ABNORMAL LOW (ref 17.0–34.0)

## 2013-08-08 LAB — TRIGLYCERIDES: Triglycerides: 201 mg/dL — ABNORMAL HIGH (ref 0.0–149.0)

## 2013-08-08 LAB — PHOSPHORUS: Phosphorus: 3.8 mg/dL (ref 2.3–4.6)

## 2013-08-08 LAB — C-REACTIVE PROTEIN: CRP: 0.5 mg/dL (ref 0.5–20.0)

## 2013-08-08 LAB — BILIRUBIN, DIRECT: BILIRUBIN DIRECT: 0.1 mg/dL (ref 0.0–0.3)

## 2013-08-09 LAB — CBC WITH DIFFERENTIAL/PLATELET
BASOS ABS: 0 10*3/uL (ref 0.0–0.1)
Basophils Relative: 0.7 % (ref 0.0–3.0)
EOS ABS: 0.3 10*3/uL (ref 0.0–0.7)
Eosinophils Relative: 5.4 % — ABNORMAL HIGH (ref 0.0–5.0)
HCT: 32.7 % — ABNORMAL LOW (ref 36.0–46.0)
Hemoglobin: 10.4 g/dL — ABNORMAL LOW (ref 12.0–15.0)
Lymphocytes Relative: 35.5 % (ref 12.0–46.0)
Lymphs Abs: 1.8 10*3/uL (ref 0.7–4.0)
MCHC: 31.8 g/dL (ref 30.0–36.0)
MCV: 98.3 fl (ref 78.0–100.0)
MONO ABS: 0.4 10*3/uL (ref 0.1–1.0)
Monocytes Relative: 7.9 % (ref 3.0–12.0)
NEUTROS PCT: 50.5 % (ref 43.0–77.0)
Neutro Abs: 2.6 10*3/uL (ref 1.4–7.7)
Platelets: 103 10*3/uL — ABNORMAL LOW (ref 150.0–400.0)
RBC: 3.33 Mil/uL — AB (ref 3.87–5.11)
RDW: 19.9 % — AB (ref 11.5–14.6)
WBC: 5.2 10*3/uL (ref 4.5–10.5)

## 2013-08-11 NOTE — Progress Notes (Signed)
Quick Note:  Please forward ______ 

## 2013-08-26 ENCOUNTER — Other Ambulatory Visit (HOSPITAL_BASED_OUTPATIENT_CLINIC_OR_DEPARTMENT_OTHER): Payer: BC Managed Care – PPO

## 2013-08-26 ENCOUNTER — Ambulatory Visit: Payer: BC Managed Care – PPO

## 2013-08-26 DIAGNOSIS — D649 Anemia, unspecified: Secondary | ICD-10-CM

## 2013-08-26 DIAGNOSIS — D638 Anemia in other chronic diseases classified elsewhere: Secondary | ICD-10-CM

## 2013-08-26 LAB — CBC WITH DIFFERENTIAL/PLATELET
BASO%: 0.7 % (ref 0.0–2.0)
Basophils Absolute: 0 10*3/uL (ref 0.0–0.1)
EOS%: 5.7 % (ref 0.0–7.0)
Eosinophils Absolute: 0.4 10*3/uL (ref 0.0–0.5)
HCT: 35.1 % (ref 34.8–46.6)
HGB: 11.2 g/dL — ABNORMAL LOW (ref 11.6–15.9)
LYMPH%: 40.5 % (ref 14.0–49.7)
MCH: 31.6 pg (ref 25.1–34.0)
MCHC: 32 g/dL (ref 31.5–36.0)
MCV: 98.9 fL (ref 79.5–101.0)
MONO#: 0.6 10*3/uL (ref 0.1–0.9)
MONO%: 9.1 % (ref 0.0–14.0)
NEUT#: 2.8 10*3/uL (ref 1.5–6.5)
NEUT%: 44 % (ref 38.4–76.8)
PLATELETS: 120 10*3/uL — AB (ref 145–400)
RBC: 3.55 10*6/uL — ABNORMAL LOW (ref 3.70–5.45)
RDW: 15.1 % — ABNORMAL HIGH (ref 11.2–14.5)
WBC: 6.4 10*3/uL (ref 3.9–10.3)
lymph#: 2.6 10*3/uL (ref 0.9–3.3)

## 2013-08-26 MED ORDER — DARBEPOETIN ALFA-POLYSORBATE 500 MCG/ML IJ SOLN: 300.0000 ug | Freq: Once | INTRAMUSCULAR | Status: DC

## 2013-08-30 ENCOUNTER — Other Ambulatory Visit: Payer: Self-pay | Admitting: Family Medicine

## 2013-08-30 NOTE — Telephone Encounter (Signed)
Med filled.  

## 2013-09-05 ENCOUNTER — Other Ambulatory Visit (INDEPENDENT_AMBULATORY_CARE_PROVIDER_SITE_OTHER): Payer: BC Managed Care – PPO

## 2013-09-05 ENCOUNTER — Ambulatory Visit (INDEPENDENT_AMBULATORY_CARE_PROVIDER_SITE_OTHER): Payer: BC Managed Care – PPO | Admitting: Family Medicine

## 2013-09-05 ENCOUNTER — Encounter: Payer: Self-pay | Admitting: Family Medicine

## 2013-09-05 VITALS — BP 124/84 | HR 61 | Temp 98.2°F | Resp 16 | Wt 148.2 lb

## 2013-09-05 DIAGNOSIS — M810 Age-related osteoporosis without current pathological fracture: Secondary | ICD-10-CM

## 2013-09-05 DIAGNOSIS — K912 Postsurgical malabsorption, not elsewhere classified: Secondary | ICD-10-CM

## 2013-09-05 DIAGNOSIS — I509 Heart failure, unspecified: Secondary | ICD-10-CM

## 2013-09-05 DIAGNOSIS — E559 Vitamin D deficiency, unspecified: Secondary | ICD-10-CM

## 2013-09-05 DIAGNOSIS — I5023 Acute on chronic systolic (congestive) heart failure: Secondary | ICD-10-CM

## 2013-09-05 LAB — COMPREHENSIVE METABOLIC PANEL
ALBUMIN: 3.3 g/dL — AB (ref 3.5–5.2)
ALT: 42 U/L — ABNORMAL HIGH (ref 0–35)
AST: 26 U/L (ref 0–37)
Alkaline Phosphatase: 78 U/L (ref 39–117)
BUN: 18 mg/dL (ref 6–23)
CALCIUM: 8.6 mg/dL (ref 8.4–10.5)
CO2: 22 meq/L (ref 19–32)
Chloride: 111 mEq/L (ref 96–112)
Creatinine, Ser: 0.9 mg/dL (ref 0.4–1.2)
GFR: 64.65 mL/min (ref >60.00)
GLUCOSE: 84 mg/dL (ref 70–99)
POTASSIUM: 3.5 meq/L (ref 3.5–5.1)
Sodium: 139 mEq/L (ref 135–145)
TOTAL PROTEIN: 7.3 g/dL (ref 6.0–8.3)
Total Bilirubin: 0.5 mg/dL (ref 0.3–1.2)

## 2013-09-05 LAB — CBC WITH DIFFERENTIAL/PLATELET
Basophils Absolute: 0 10*3/uL (ref 0.0–0.1)
Basophils Relative: 0.4 % (ref 0.0–3.0)
Eosinophils Absolute: 0.4 10*3/uL (ref 0.0–0.7)
Eosinophils Relative: 7.6 % — ABNORMAL HIGH (ref 0.0–5.0)
HEMATOCRIT: 32.6 % — AB (ref 36.0–46.0)
Hemoglobin: 10.7 g/dL — ABNORMAL LOW (ref 12.0–15.0)
LYMPHS ABS: 2 10*3/uL (ref 0.7–4.0)
Lymphocytes Relative: 41.3 % (ref 12.0–46.0)
MCHC: 32.8 g/dL (ref 30.0–36.0)
MCV: 98 fl (ref 78.0–100.0)
MONOS PCT: 9.1 % (ref 3.0–12.0)
Monocytes Absolute: 0.4 10*3/uL (ref 0.1–1.0)
NEUTROS PCT: 41.6 % — AB (ref 43.0–77.0)
Neutro Abs: 2 10*3/uL (ref 1.4–7.7)
PLATELETS: 97 10*3/uL — AB (ref 150.0–400.0)
RBC: 3.33 Mil/uL — ABNORMAL LOW (ref 3.87–5.11)
RDW: 14.3 % (ref 11.5–14.6)
WBC: 4.8 10*3/uL (ref 4.5–10.5)

## 2013-09-05 LAB — MAGNESIUM: Magnesium: 1.6 mg/dL (ref 1.5–2.5)

## 2013-09-05 LAB — PHOSPHORUS: Phosphorus: 3.8 mg/dL (ref 2.3–4.6)

## 2013-09-05 LAB — TRIGLYCERIDES: Triglycerides: 161 mg/dL — ABNORMAL HIGH (ref 0.0–149.0)

## 2013-09-05 LAB — BILIRUBIN, DIRECT: Bilirubin, Direct: 0 mg/dL (ref 0.0–0.3)

## 2013-09-05 LAB — C-REACTIVE PROTEIN

## 2013-09-05 MED ORDER — ONDANSETRON HCL 4 MG PO TABS: 4.0000 mg | ORAL_TABLET | Freq: Three times a day (TID) | ORAL | Status: DC | PRN

## 2013-09-05 NOTE — Assessment & Plan Note (Signed)
Pt overdue for DEXA.  Order entered.  Has previously had Reclast infusions.  Will determine treatment options once results available.

## 2013-09-05 NOTE — Progress Notes (Signed)
   Subjective:    Patient ID: [name] [name], female    DOB: 1951/01/22, 63 y.o.   MRN: [id number]  HPI Vit D deficiency- chronic problem, pt gets labs drawn regularly due to her chronic use of TPN.  Currently on Vit D 3 OTC w/ better results than the prescription 50,000 units.  Osteoporosis- due for DEXA.  Prefers to hold at this time until after dental procedure in June.    CHF- noted on ECHO during hospitalization Aug 2013.  EF was noted to be 30%.  Pt was on Coreg but stopped this last year.  Currently on Isosorbide daily.  Asking to stop this.  Has not seen cards since 02/2012.  Due for repeat ECHO.  No CP, SOB, edema.   Review of Systems For ROS see HPI     Objective:   Physical Exam  Vitals reviewed. Constitutional: She is oriented to person, place, and time. She appears well-developed and well-nourished. No distress.  HENT:  Head: Normocephalic and atraumatic.  Neck: Normal range of motion. Neck supple.  Cardiovascular: Normal rate, regular rhythm and intact distal pulses.   Murmur (II/VI SEM heard over RUSB) heard. Pulmonary/Chest: Effort normal and breath sounds normal. No respiratory distress. She has no wheezes. She has no rales.  Musculoskeletal: She exhibits edema (trace LE edema bilaterally).  Neurological: She is alert and oriented to person, place, and time.          Assessment & Plan:

## 2013-09-05 NOTE — Addendum Note (Signed)
Addended by: [name] on: 09/05/2013 04:55 PM   Modules accepted: Orders

## 2013-09-05 NOTE — Assessment & Plan Note (Signed)
Pt has not seen cardiology in over a year and has been stopping her meds w/o direction to do so.  Asking to stop isosorbide today.  Reviewed chart and informed pt that before we make any medication changes, we need to repeat her ECHO.  Pt agreeable to this.  Will follow closely- order entered.

## 2013-09-05 NOTE — Progress Notes (Signed)
Quick Note:  Labs stable Please forward to health agenct ______

## 2013-09-05 NOTE — Patient Instructions (Addendum)
Schedule your complete physical in 6 months We'll call you to get the bone density appt set up for later this year We'll get your ECHO set up (ultrasound of heart) before we stop the isosorbide Keep up the good work!  You look great! Happy Spring!!!

## 2013-09-05 NOTE — Progress Notes (Signed)
Pre visit review using our clinic review tool, if applicable. No additional management support is needed unless otherwise documented below in the visit note. 

## 2013-09-05 NOTE — Assessment & Plan Note (Signed)
Pt reports she gets this checked at least every 6 months due to her TPN.  Taking daily OTC supplement.

## 2013-09-06 LAB — PREALBUMIN: Prealbumin: 16.6 mg/dL — ABNORMAL LOW (ref 17.0–34.0)

## 2013-09-13 ENCOUNTER — Encounter: Payer: Self-pay | Admitting: Internal Medicine

## 2013-09-13 ENCOUNTER — Ambulatory Visit (INDEPENDENT_AMBULATORY_CARE_PROVIDER_SITE_OTHER): Payer: BC Managed Care – PPO | Admitting: Internal Medicine

## 2013-09-13 VITALS — BP 108/54 | HR 68 | Ht 66.54 in | Wt 146.0 lb

## 2013-09-13 DIAGNOSIS — K912 Postsurgical malabsorption, not elsewhere classified: Secondary | ICD-10-CM

## 2013-09-13 DIAGNOSIS — D6859 Other primary thrombophilia: Secondary | ICD-10-CM

## 2013-09-13 DIAGNOSIS — K6389 Other specified diseases of intestine: Secondary | ICD-10-CM

## 2013-09-13 NOTE — Patient Instructions (Signed)
Stay on your regimen and follow up with [place] in a year or sooner if needed.   I appreciate the opportunity to care for you.

## 2013-09-13 NOTE — Assessment & Plan Note (Signed)
Continue Abx - trying to spread out the interval

## 2013-09-13 NOTE — Assessment & Plan Note (Signed)
Off anticoag Tx on Gattex So far so good

## 2013-09-13 NOTE — Assessment & Plan Note (Signed)
Nice response to [facility] - 4/7 nights TPN

## 2013-09-13 NOTE — Progress Notes (Signed)
         Subjective:    Patient ID: [name] [name], female    DOB: 15-Jan-1951, 63 y.o.   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 She has been on Gattex for approximately 1 year and has reduced TPN to 4 nights a week. Has been as low as 3 but back to 4 with some weight loss. Stools are firmer - like that of a thick milkshake since starting Gattex. Chronic anemia has been managed in hematology. She has seen Dr. Alisia Ferrari at [facility] in past few months. He has suggested she try to space out antibiotics for SIBO more. She remains off anti-coag Tx at her choice. So far no problems with clots/thrombosis.  Medications, allergies, past medical history, past surgical history, family history and social history are reviewed and updated in the EMR.  Review of Systems Remaining teeth to be removed and then dentures To have DEXA  To have Echo    Objective:   Physical Exam General: NAD  Eyes: anicteric  Lungs: clear  Heart: S1S2 no rubs, murmurs or gallops  Abdomen: soft and nontender, BS+ , scar in RLQ (SQ) as before Ext: no edema - PICC left arm - site ok     Assessment & Plan:  Acquired short bowel syndrome Nice response to [facility] - 4/7 nights TPN  Bacterial overgrowth syndrome Continue Abx - trying to spread out the interval  Thrombophilia Off anticoag Tx on Gattex So far so good   RTC 1 year sooner prn  Gatha Mayer, MD, [facility] [phone] (pager) 09/13/2013 12:08 PM

## 2013-09-23 ENCOUNTER — Ambulatory Visit: Payer: BC Managed Care – PPO

## 2013-09-23 ENCOUNTER — Other Ambulatory Visit (HOSPITAL_BASED_OUTPATIENT_CLINIC_OR_DEPARTMENT_OTHER): Payer: BC Managed Care – PPO

## 2013-09-23 DIAGNOSIS — D638 Anemia in other chronic diseases classified elsewhere: Secondary | ICD-10-CM

## 2013-09-23 DIAGNOSIS — D649 Anemia, unspecified: Secondary | ICD-10-CM

## 2013-09-23 LAB — CBC WITH DIFFERENTIAL/PLATELET
BASO%: 1.1 % (ref 0.0–2.0)
Basophils Absolute: 0.1 10*3/uL (ref 0.0–0.1)
EOS%: 7.9 % — ABNORMAL HIGH (ref 0.0–7.0)
Eosinophils Absolute: 0.5 10*3/uL (ref 0.0–0.5)
HCT: 36.1 % (ref 34.8–46.6)
HEMOGLOBIN: 11.5 g/dL — AB (ref 11.6–15.9)
LYMPH#: 2.2 10*3/uL (ref 0.9–3.3)
LYMPH%: 36.4 % (ref 14.0–49.7)
MCH: 31.2 pg (ref 25.1–34.0)
MCHC: 31.9 g/dL (ref 31.5–36.0)
MCV: 97.8 fL (ref 79.5–101.0)
MONO#: 0.6 10*3/uL (ref 0.1–0.9)
MONO%: 9.3 % (ref 0.0–14.0)
NEUT#: 2.7 10*3/uL (ref 1.5–6.5)
NEUT%: 45.3 % (ref 38.4–76.8)
PLATELETS: 111 10*3/uL — AB (ref 145–400)
RBC: 3.69 10*6/uL — ABNORMAL LOW (ref 3.70–5.45)
RDW: 13.4 % (ref 11.2–14.5)
WBC: 6 10*3/uL (ref 3.9–10.3)

## 2013-09-23 MED ORDER — DARBEPOETIN ALFA-POLYSORBATE 500 MCG/ML IJ SOLN: 300.0000 ug | Freq: Once | INTRAMUSCULAR | Status: DC

## 2013-09-26 ENCOUNTER — Ambulatory Visit (HOSPITAL_COMMUNITY): Payer: BC Managed Care – PPO | Attending: Cardiovascular Disease | Admitting: Radiology

## 2013-09-26 ENCOUNTER — Other Ambulatory Visit: Payer: Self-pay | Admitting: Family Medicine

## 2013-09-26 ENCOUNTER — Other Ambulatory Visit (INDEPENDENT_AMBULATORY_CARE_PROVIDER_SITE_OTHER): Payer: BC Managed Care – PPO

## 2013-09-26 DIAGNOSIS — I5023 Acute on chronic systolic (congestive) heart failure: Secondary | ICD-10-CM

## 2013-09-26 DIAGNOSIS — K912 Postsurgical malabsorption, not elsewhere classified: Secondary | ICD-10-CM

## 2013-09-26 DIAGNOSIS — I509 Heart failure, unspecified: Secondary | ICD-10-CM

## 2013-09-26 LAB — CBC WITH DIFFERENTIAL/PLATELET
Basophils Absolute: 0 10*3/uL (ref 0.0–0.1)
Basophils Relative: 0.7 % (ref 0.0–3.0)
EOS PCT: 6.5 % — AB (ref 0.0–5.0)
Eosinophils Absolute: 0.4 10*3/uL (ref 0.0–0.7)
HEMATOCRIT: 34.6 % — AB (ref 36.0–46.0)
Hemoglobin: 11.5 g/dL — ABNORMAL LOW (ref 12.0–15.0)
LYMPHS ABS: 2.2 10*3/uL (ref 0.7–4.0)
Lymphocytes Relative: 40.2 % (ref 12.0–46.0)
MCHC: 33.1 g/dL (ref 30.0–36.0)
MCV: 96.5 fl (ref 78.0–100.0)
MONOS PCT: 8 % (ref 3.0–12.0)
Monocytes Absolute: 0.4 10*3/uL (ref 0.1–1.0)
NEUTROS PCT: 44.6 % (ref 43.0–77.0)
Neutro Abs: 2.5 10*3/uL (ref 1.4–7.7)
Platelets: 112 10*3/uL — ABNORMAL LOW (ref 150.0–400.0)
RBC: 3.58 Mil/uL — AB (ref 3.87–5.11)
RDW: 14.1 % (ref 11.5–15.5)
WBC: 5.5 10*3/uL (ref 4.0–10.5)

## 2013-09-26 LAB — COMPREHENSIVE METABOLIC PANEL
ALBUMIN: 3.5 g/dL (ref 3.5–5.2)
ALT: 23 U/L (ref 0–35)
AST: 23 U/L (ref 0–37)
Alkaline Phosphatase: 72 U/L (ref 39–117)
BUN: 21 mg/dL (ref 6–23)
CO2: 23 mEq/L (ref 19–32)
Calcium: 8.9 mg/dL (ref 8.4–10.5)
Chloride: 107 mEq/L (ref 96–112)
Creatinine, Ser: 1.1 mg/dL (ref 0.4–1.2)
GFR: 51.62 mL/min — AB (ref >60.00)
GLUCOSE: 87 mg/dL (ref 70–99)
POTASSIUM: 4 meq/L (ref 3.5–5.1)
SODIUM: 137 meq/L (ref 135–145)
TOTAL PROTEIN: 7.7 g/dL (ref 6.0–8.3)
Total Bilirubin: 0.4 mg/dL (ref 0.2–1.2)

## 2013-09-26 LAB — TRIGLYCERIDES: Triglycerides: 167 mg/dL — ABNORMAL HIGH (ref 0.0–149.0)

## 2013-09-26 LAB — C-REACTIVE PROTEIN

## 2013-09-26 LAB — BILIRUBIN, DIRECT: Bilirubin, Direct: 0.1 mg/dL (ref 0.0–0.3)

## 2013-09-26 LAB — PHOSPHORUS: Phosphorus: 4 mg/dL (ref 2.3–4.6)

## 2013-09-26 LAB — MAGNESIUM: Magnesium: 1.6 mg/dL (ref 1.5–2.5)

## 2013-09-26 NOTE — Progress Notes (Signed)
Echocardiogram performed.  

## 2013-09-27 LAB — PREALBUMIN: Prealbumin: 19.2 mg/dL (ref 17.0–34.0)

## 2013-09-27 NOTE — Progress Notes (Signed)
Quick Note:  Please forward to TPN agency ______ 

## 2013-09-28 ENCOUNTER — Telehealth: Payer: Self-pay | Admitting: Internal Medicine

## 2013-09-28 NOTE — Telephone Encounter (Signed)
Please advise Sir, thank you. 

## 2013-09-28 NOTE — Telephone Encounter (Signed)
No problem Please see how we need to do this

## 2013-09-29 NOTE — Telephone Encounter (Signed)
Left message for [name] [name] @ [employer] to call me back, ok per Dr. Carlean Purl to refill the [facility].

## 2013-09-29 NOTE — Telephone Encounter (Signed)
[name] [name] called back and she is faxing forms over for [place] to fill out for the Gattex rx, we will fill out and fax back to them to her and send them down to be scanned in.

## 2013-10-05 ENCOUNTER — Telehealth: Payer: Self-pay

## 2013-10-05 ENCOUNTER — Institutional Professional Consult (permissible substitution): Payer: BC Managed Care – PPO | Admitting: Cardiovascular Disease

## 2013-10-05 NOTE — Telephone Encounter (Signed)
Message copied by Marlon Pel on Wed Oct 05, 2013  8:44 AM ------      Message from: Gatha Mayer      Created: Tue Oct 04, 2013  4:33 PM      Regarding: colonoscopy       As part of re-prescribing Agattex I realized she is due for a colonoscopy (about 1 year after starting Tx) as that can increase colon polyps      She has a hx polyps also             Ask her to schedule a colonoscopy ------

## 2013-10-05 NOTE — Telephone Encounter (Signed)
No answer.  I will continue to try and reach the patient 

## 2013-10-05 NOTE — Telephone Encounter (Signed)
I spoke with the patient.  She is scheduled for August 4 and pre-visit for 7/20

## 2013-10-17 ENCOUNTER — Other Ambulatory Visit (INDEPENDENT_AMBULATORY_CARE_PROVIDER_SITE_OTHER): Payer: BC Managed Care – PPO

## 2013-10-17 DIAGNOSIS — K912 Postsurgical malabsorption, not elsewhere classified: Secondary | ICD-10-CM

## 2013-10-17 LAB — COMPREHENSIVE METABOLIC PANEL
ALK PHOS: 80 U/L (ref 39–117)
ALT: 30 U/L (ref 0–35)
AST: 21 U/L (ref 0–37)
Albumin: 3.3 g/dL — ABNORMAL LOW (ref 3.5–5.2)
BILIRUBIN TOTAL: 0.5 mg/dL (ref 0.2–1.2)
BUN: 19 mg/dL (ref 6–23)
CO2: 22 mEq/L (ref 19–32)
Calcium: 8.5 mg/dL (ref 8.4–10.5)
Chloride: 112 mEq/L (ref 96–112)
Creatinine, Ser: 1 mg/dL (ref 0.4–1.2)
GFR: 60.12 mL/min (ref >60.00)
Glucose, Bld: 99 mg/dL (ref 70–99)
Potassium: 3.1 mEq/L — ABNORMAL LOW (ref 3.5–5.1)
SODIUM: 140 meq/L (ref 135–145)
TOTAL PROTEIN: 7.2 g/dL (ref 6.0–8.3)

## 2013-10-17 LAB — CBC WITH DIFFERENTIAL/PLATELET
BASOS ABS: 0 10*3/uL (ref 0.0–0.1)
Basophils Relative: 0.7 % (ref 0.0–3.0)
Eosinophils Absolute: 0.3 10*3/uL (ref 0.0–0.7)
Eosinophils Relative: 5.8 % — ABNORMAL HIGH (ref 0.0–5.0)
HEMATOCRIT: 33.1 % — AB (ref 36.0–46.0)
Hemoglobin: 10.8 g/dL — ABNORMAL LOW (ref 12.0–15.0)
LYMPHS ABS: 1.8 10*3/uL (ref 0.7–4.0)
Lymphocytes Relative: 39 % (ref 12.0–46.0)
MCHC: 32.6 g/dL (ref 30.0–36.0)
MCV: 95.2 fl (ref 78.0–100.0)
MONO ABS: 0.5 10*3/uL (ref 0.1–1.0)
Monocytes Relative: 10.6 % (ref 3.0–12.0)
Neutro Abs: 2 10*3/uL (ref 1.4–7.7)
Neutrophils Relative %: 43.9 % (ref 43.0–77.0)
PLATELETS: 90 10*3/uL — AB (ref 150.0–400.0)
RBC: 3.48 Mil/uL — ABNORMAL LOW (ref 3.87–5.11)
RDW: 14 % (ref 11.5–15.5)
WBC: 4.6 10*3/uL (ref 4.0–10.5)

## 2013-10-17 LAB — PHOSPHORUS: PHOSPHORUS: 3.6 mg/dL (ref 2.3–4.6)

## 2013-10-17 LAB — BILIRUBIN, DIRECT: Bilirubin, Direct: 0.1 mg/dL (ref 0.0–0.3)

## 2013-10-17 LAB — TRIGLYCERIDES: TRIGLYCERIDES: 166 mg/dL — AB (ref 0.0–149.0)

## 2013-10-17 LAB — C-REACTIVE PROTEIN: CRP: <0.5 mg/dL (ref 0.5–20.0)

## 2013-10-17 LAB — PREALBUMIN: Prealbumin: 18 mg/dL (ref 17.0–34.0)

## 2013-10-17 LAB — MAGNESIUM: Magnesium: 1.7 mg/dL (ref 1.5–2.5)

## 2013-10-20 ENCOUNTER — Telehealth: Payer: Self-pay | Admitting: Internal Medicine

## 2013-10-20 NOTE — Telephone Encounter (Signed)
Results faxed Results faxed to [phone]

## 2013-10-21 ENCOUNTER — Ambulatory Visit: Payer: BC Managed Care – PPO

## 2013-10-21 ENCOUNTER — Other Ambulatory Visit (HOSPITAL_BASED_OUTPATIENT_CLINIC_OR_DEPARTMENT_OTHER): Payer: BC Managed Care – PPO

## 2013-10-21 DIAGNOSIS — D638 Anemia in other chronic diseases classified elsewhere: Secondary | ICD-10-CM

## 2013-10-21 DIAGNOSIS — D649 Anemia, unspecified: Secondary | ICD-10-CM

## 2013-10-21 LAB — CBC WITH DIFFERENTIAL/PLATELET
BASO%: 0.6 % (ref 0.0–2.0)
Basophils Absolute: 0 10*3/uL (ref 0.0–0.1)
EOS ABS: 0.3 10*3/uL (ref 0.0–0.5)
EOS%: 6 % (ref 0.0–7.0)
HCT: 34.2 % — ABNORMAL LOW (ref 34.8–46.6)
HGB: 11 g/dL — ABNORMAL LOW (ref 11.6–15.9)
LYMPH%: 41.4 % (ref 14.0–49.7)
MCH: 30.6 pg (ref 25.1–34.0)
MCHC: 32.1 g/dL (ref 31.5–36.0)
MCV: 95.4 fL (ref 79.5–101.0)
MONO#: 0.5 10*3/uL (ref 0.1–0.9)
MONO%: 10.1 % (ref 0.0–14.0)
NEUT%: 41.9 % (ref 38.4–76.8)
NEUTROS ABS: 2.1 10*3/uL (ref 1.5–6.5)
Platelets: 101 10*3/uL — ABNORMAL LOW (ref 145–400)
RBC: 3.59 10*6/uL — AB (ref 3.70–5.45)
RDW: 13.6 % (ref 11.2–14.5)
WBC: 5.1 10*3/uL (ref 3.9–10.3)
lymph#: 2.1 10*3/uL (ref 0.9–3.3)

## 2013-10-21 MED ORDER — DARBEPOETIN ALFA-POLYSORBATE 500 MCG/ML IJ SOLN: 300.0000 ug | Freq: Once | INTRAMUSCULAR | Status: DC

## 2013-10-25 ENCOUNTER — Ambulatory Visit: Payer: BC Managed Care – PPO | Admitting: Physician Assistant

## 2013-10-25 NOTE — Progress Notes (Signed)
Quick Note:  Please send to TPN agency ______ 

## 2013-11-08 ENCOUNTER — Ambulatory Visit (INDEPENDENT_AMBULATORY_CARE_PROVIDER_SITE_OTHER)
Admission: RE | Admit: 2013-11-08 | Discharge: 2013-11-08 | Disposition: A | Discharge status: Home / Self Care | Payer: BC Managed Care – PPO | Source: Ambulatory Visit | Attending: Family Medicine | Admitting: Family Medicine

## 2013-11-08 DIAGNOSIS — M810 Age-related osteoporosis without current pathological fracture: Secondary | ICD-10-CM

## 2013-11-14 ENCOUNTER — Other Ambulatory Visit (INDEPENDENT_AMBULATORY_CARE_PROVIDER_SITE_OTHER): Payer: BC Managed Care – PPO

## 2013-11-14 DIAGNOSIS — K912 Postsurgical malabsorption, not elsewhere classified: Secondary | ICD-10-CM

## 2013-11-14 LAB — COMPREHENSIVE METABOLIC PANEL
ALK PHOS: 79 U/L (ref 39–117)
ALT: 29 U/L (ref 0–35)
AST: 20 U/L (ref 0–37)
Albumin: 3.3 g/dL — ABNORMAL LOW (ref 3.5–5.2)
BUN: 22 mg/dL (ref 6–23)
CO2: 23 meq/L (ref 19–32)
CREATININE: 1 mg/dL (ref 0.4–1.2)
Calcium: 8.8 mg/dL (ref 8.4–10.5)
Chloride: 109 mEq/L (ref 96–112)
GFR: 61.54 mL/min (ref >60.00)
Glucose, Bld: 107 mg/dL — ABNORMAL HIGH (ref 70–99)
Potassium: 3.8 mEq/L (ref 3.5–5.1)
SODIUM: 139 meq/L (ref 135–145)
Total Bilirubin: 0.7 mg/dL (ref 0.2–1.2)
Total Protein: 7.7 g/dL (ref 6.0–8.3)

## 2013-11-14 LAB — PREALBUMIN: Prealbumin: 16.5 mg/dL — ABNORMAL LOW (ref 17.0–34.0)

## 2013-11-14 LAB — CBC WITH DIFFERENTIAL/PLATELET
BASOS ABS: 0 10*3/uL (ref 0.0–0.1)
Basophils Relative: 0.4 % (ref 0.0–3.0)
EOS ABS: 0.2 10*3/uL (ref 0.0–0.7)
Eosinophils Relative: 5.4 % — ABNORMAL HIGH (ref 0.0–5.0)
HCT: 31 % — ABNORMAL LOW (ref 36.0–46.0)
Hemoglobin: 10.3 g/dL — ABNORMAL LOW (ref 12.0–15.0)
LYMPHS ABS: 1.9 10*3/uL (ref 0.7–4.0)
Lymphocytes Relative: 41.3 % (ref 12.0–46.0)
MCHC: 33.3 g/dL (ref 30.0–36.0)
MCV: 93.4 fl (ref 78.0–100.0)
MONOS PCT: 9.6 % (ref 3.0–12.0)
Monocytes Absolute: 0.4 10*3/uL (ref 0.1–1.0)
Neutro Abs: 2 10*3/uL (ref 1.4–7.7)
Neutrophils Relative %: 43.3 % (ref 43.0–77.0)
RBC: 3.32 Mil/uL — ABNORMAL LOW (ref 3.87–5.11)
RDW: 15.5 % (ref 11.5–15.5)
WBC: 4.5 10*3/uL (ref 4.0–10.5)

## 2013-11-14 LAB — TRIGLYCERIDES: TRIGLYCERIDES: 129 mg/dL (ref 0.0–149.0)

## 2013-11-14 LAB — C-REACTIVE PROTEIN: CRP: <0.5 mg/dL (ref 0.5–20.0)

## 2013-11-14 LAB — BILIRUBIN, DIRECT: BILIRUBIN DIRECT: 0.1 mg/dL (ref 0.0–0.3)

## 2013-11-14 LAB — MAGNESIUM: Magnesium: 1.7 mg/dL (ref 1.5–2.5)

## 2013-11-14 LAB — PHOSPHORUS: PHOSPHORUS: 3.7 mg/dL (ref 2.3–4.6)

## 2013-11-17 ENCOUNTER — Telehealth: Payer: Self-pay | Admitting: Internal Medicine

## 2013-11-17 ENCOUNTER — Ambulatory Visit (INDEPENDENT_AMBULATORY_CARE_PROVIDER_SITE_OTHER): Payer: BC Managed Care – PPO | Admitting: Physician Assistant

## 2013-11-17 ENCOUNTER — Encounter: Payer: Self-pay | Admitting: Physician Assistant

## 2013-11-17 VITALS — BP 130/70 | HR 64 | Ht 67.0 in | Wt 149.0 lb

## 2013-11-17 DIAGNOSIS — I6529 Occlusion and stenosis of unspecified carotid artery: Secondary | ICD-10-CM

## 2013-11-17 DIAGNOSIS — K912 Postsurgical malabsorption, not elsewhere classified: Secondary | ICD-10-CM

## 2013-11-17 DIAGNOSIS — I428 Other cardiomyopathies: Secondary | ICD-10-CM

## 2013-11-17 DIAGNOSIS — R9431 Abnormal electrocardiogram [ECG] [EKG]: Secondary | ICD-10-CM

## 2013-11-17 DIAGNOSIS — Z8719 Personal history of other diseases of the digestive system: Secondary | ICD-10-CM

## 2013-11-17 DIAGNOSIS — I5022 Chronic systolic (congestive) heart failure: Secondary | ICD-10-CM

## 2013-11-17 MED ORDER — ASPIRIN EC 81 MG PO TBEC: 81.0000 mg | DELAYED_RELEASE_TABLET | Freq: Every day | ORAL | Status: DC

## 2013-11-17 NOTE — Telephone Encounter (Signed)
Labs faxed

## 2013-11-17 NOTE — Patient Instructions (Signed)
STOP ISOSORBIDE  START ASPRIN 38 MG DAILY  Your physician has requested that you have en exercise stress myoview. For further information please visit [email or web address]. Please follow instruction sheet, as given.  Your physician has requested that you have a carotid duplex. This test is an ultrasound of the carotid arteries in your neck. It looks at blood flow through these arteries that supply the brain with blood. Allow one hour for this exam. There are no restrictions or special instructions.  Your physician recommends that you schedule a follow-up appointment in: [place]. [name] [name]

## 2013-11-17 NOTE — Progress Notes (Signed)
Cardiology Office Note    Date:  11/17/2013   ID:  [name] [name], DOB 10-25-1950, MRN [id number]  PCP:  Annye Asa, MD  Cardiologist:  Dr. Jenkins Rouge      History of Present Illness: [name] [name] is a 63 y.o. 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  She is followed here by Dr. Carlean Purl and at [facility] with Dr. Clementeen Graham (has been placed on Gattex).  She established with Dr. Jenkins Rouge during admission for acute systolic CHF in 07/4354.  Nuclear study did demonstrate scar with minimal peri-infarct ischemia. Cardiac catheterization was deferred secondary to acute renal failure and recent admission for fungemia. Last seen by Dr. Johnsie Cancel 02/2012.  Her PCP recently arranged a follow up echocardiogram. This demonstrated improved LV function with an EF of 45%. The patient is interested in coming off of some of her medications. She arranged follow up here today. She has already stopped taking carvedilol. She stopped this several months ago. She would like to come off of isosorbide. She denies chest pain, shortness of breath, syncope, orthopnea, PND or edema. She is NYHA class II.   Studies:  - Echo (12/2011):  EF 30%  - Echo (09/26/13):  Inferior and apical HK, EF 45%, mild MR  - Nuclear (12/26/11):  Inferior/inferolateral/ateroseptal/apical scar with minimal peri-infarct ischemia, EF 33%.   - Carotid US (12/2011):  R 60-79%   Recent Labs: 11/14/2013: ALT 29; Creatinine 1.0; Hemoglobin 10.3*; Potassium 3.8   Wt Readings from Last 3 Encounters:  09/13/13 146 lb (66.225 kg)  09/05/13 148 lb 4 oz (67.246 kg)  10/28/12 152 lb 4.8 oz (69.083 kg)     Past Medical History  Diagnosis Date  . Short  bowel syndrome     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  . Abnormal LFTs   . Vitamin D deficiency   . Personal history of colonic polyps   . Thrombophilia   . Diabetes mellitus     resolved after weight loss  . Obesity     prior to weight loss  . Allergic rhinosinusitis   . At risk for dental problems   . Fracture of left clavicle   . Osteoporosis   . Anemia of chronic disease   . Renal insufficiency   . Atypical nevus   . Pathologic fracture of neck of femur   . Pancytopenia 10/07/2011  . Small bowel ischemia   . Bacterial overgrowth syndrome   . Splenomegaly     By ultrasound  . 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 ADULT TPN Inject 2,100 mLs into the vein continuous. Infuse 2160ml over 10 hours with 1 hour ramp up and 1 hour ramp down. Total bag per day provides: 250g Dextrose 70%, 100g Travasol 10%, 45g Intralipids 30%, various electrolytes, MVI, TE, Zinc, selenium.      . ciprofloxacin (CIPRO) 500 MG tablet Take 1 tablet (500 mg total) by mouth 2 (two) times daily.  28 tablet  2  . diphenhydrAMINE (BENADRYL) 25 mg capsule Take 25 mg by mouth every 6 (six) hours as needed. For itching/sleep.      . diphenoxylate-atropine (LOMOTIL) 2.5-0.025 MG per tablet Take 2 tablets by mouth 1/2 hour  before meals.  180 tablet  1  . isosorbide dinitrate (ISORDIL) 10 MG tablet TAKE 1 TABLET BY MOUTH EVERY DAY  30 tablet  3  . loperamide (IMODIUM A-D) 2 MG tablet Take 2 tablets 30 minutes prior to each meal  180 tablet  6  . Multiple Vitamin (MULTIVITAMIN WITH MINERALS) TABS Take 1 tablet by mouth daily.      Marland Kitchen omeprazole (PRILOSEC) 40 MG capsule TAKE ONE CAPSULE BY MOUTH TWICE DAILY AT 10 AM AND 5 PM  60 capsule  5  . ondansetron (ZOFRAN) 4 MG tablet Take 1 tablet (4 mg total) by mouth every 8 (eight) hours as needed for nausea or vomiting.  60 tablet  1  . Teduglutide, rDNA, (GATTEX) 5 MG KIT Inject 3.3 Units into the skin daily.   1 kit  5   No current facility-administered medications for this visit.    Allergies:   Iohexol and Penicillins   Social History:  The patient  reports that she quit smoking about 9 years ago. Her smoking use included Cigarettes. She smoked 0.00 packs per day. She has never used smokeless tobacco. She reports that she does not drink alcohol or use illicit drugs.   Family History:  The patient's family history includes Diabetes in her mother; Hypertension in her mother. There is no history of Colon cancer or Stomach cancer.   ROS:  Please see the history of present illness.   She has chronic diarrhea.   All other systems reviewed and negative.   PHYSICAL EXAM: VS:  BP 130/70  Pulse 64  Ht $R'5\' 7"'Sj$  (1.702 m)  Wt 149 lb (67.586 kg)  BMI 23.33 kg/m2 Well nourished, well developed, in no acute distress HEENT: normal Neck: no JVD Cardiac:  normal S1, S2; RRR; no murmur Lungs:  clear to auscultation bilaterally, no wheezing, rhonchi or rales Abd: soft, nontender, no hepatomegaly Ext: no edema Skin: warm and dry Neuro:  CNs 2-12 intact, no focal abnormalities noted  EKG:  NSR, HR 64, normal axis, IVCD, poor R wave progression, anterolateral T wave inversions, first degree block with a PR interval of 222     ASSESSMENT AND PLAN:  1. Dilated Cardiomyopathy:  Her ejection fraction has improved over time. She is NYHA class II. However, her ECG is changed. Her wall motion abnormalities would suggest that she has an underlying ischemic cardiomyopathy. She also has a history of carotid stenosis which would put her at higher risk for cardiovascular disease.  She does have a first degree AV block as well as IVCD on her ECG. Resting heart rate without beta blocker therapy is 64. I considered placing her back on beta blocker therapy. However, I reviewed her case today with Dr. Ron Parker (DOD). Given her slow heart rate and ECG abnormalities, we opted to hold off on resuming beta blocker therapy for now. She is  not getting much benefit from isosorbide. She may stop this medication. With her echocardiogram findings and her abnormal ECG, we have recommended proceeding with exercise Myoview to assess for high ischemic burden. If this is significantly abnormal, we may need to consider proceeding with cardiac catheterization.  I have asked to start on aspirin 81 mg daily. Consider the initiation of statin therapy. 2. Chronic systolic heart failure:  As noted, she is NYHA class II. She is stable without diuretic therapy. 3. Carotid Stenosis - Plan: Carotid duplex for follow up. Consider statin Rx. 4. Acquired short bowel syndrome:  Continue follow up with GI.  If there is no contraindication from a gastrointestinal standpoint, consider starting statin therapy for vascular disease. 5. H/O mesenteric infarction:  She has been off of anticoagulation for about 2 years without recurrent thromboembolism. 6. Disposition: Follow up with Dr. Johnsie Cancel in 6-8 weeks.   Signed, Versie Starks, MHS 11/17/2013 8:37 AM    [facility] [place] [address], [place], [place]  [address] Phone: [phone]; Fax: [phone]

## 2013-11-18 ENCOUNTER — Ambulatory Visit: Payer: BC Managed Care – PPO

## 2013-11-18 ENCOUNTER — Other Ambulatory Visit: Payer: BC Managed Care – PPO

## 2013-11-23 ENCOUNTER — Telehealth: Payer: Self-pay | Admitting: Physician Assistant

## 2013-11-23 NOTE — Telephone Encounter (Signed)
Reviewed with Dr. Carlean Purl.  She is ok to take statin therapy. With evidence of vascular disease (carotid stenosis), she should be on statin therapy. Start Pravastatin 20 mg QHS. Check Lipids and LFTs in 6 weeks.   Signed,  Richardson Dopp, PA-C   11/23/2013 5:29 PM

## 2013-11-24 ENCOUNTER — Telehealth: Payer: Self-pay | Admitting: *Deleted

## 2013-11-24 DIAGNOSIS — I6529 Occlusion and stenosis of unspecified carotid artery: Secondary | ICD-10-CM

## 2013-11-24 MED ORDER — PRAVASTATIN SODIUM 20 MG PO TABS: 20.0000 mg | ORAL_TABLET | Freq: Every evening | ORAL | Status: DC

## 2013-11-24 NOTE — Telephone Encounter (Signed)
pt notified about lab results and to start pravastatin 20 qhs, FLP/LFT 01/09/14 at [facility] office, rx sent in today

## 2013-11-24 NOTE — Telephone Encounter (Signed)
pt notified about lab results and to start pravastatin 20 qhs, FLP/LFT 01/09/14 at [facility] office, rx sent in today

## 2013-11-25 ENCOUNTER — Telehealth: Payer: Self-pay

## 2013-11-25 NOTE — Telephone Encounter (Signed)
Found recent EF up to 45%

## 2013-11-25 NOTE — Telephone Encounter (Signed)
Needs to be scheduled @ [facility] due to EF 30%

## 2013-11-28 ENCOUNTER — Ambulatory Visit (AMBULATORY_SURGERY_CENTER): Payer: Self-pay

## 2013-11-28 VITALS — Ht 67.5 in | Wt 146.0 lb

## 2013-11-28 DIAGNOSIS — K912 Postsurgical malabsorption, not elsewhere classified: Secondary | ICD-10-CM

## 2013-11-28 MED ORDER — MOVIPREP 100 G PO SOLR: 1.0000 | Freq: Once | ORAL | Status: DC

## 2013-11-28 NOTE — Progress Notes (Signed)
No allergies to eggs or soy No home oxygen No diet/weight loss meds No past problems with anesthesia  No email 

## 2013-11-29 ENCOUNTER — Encounter: Payer: Self-pay | Admitting: Internal Medicine

## 2013-11-30 ENCOUNTER — Telehealth: Payer: Self-pay | Admitting: Internal Medicine

## 2013-11-30 NOTE — Telephone Encounter (Signed)
Informed them that Antony Madura is to be DAW.  No generic per Barb Merino, CG-RN.

## 2013-12-08 ENCOUNTER — Telehealth: Payer: Self-pay

## 2013-12-08 DIAGNOSIS — Z0279 Encounter for issue of other medical certificate: Secondary | ICD-10-CM

## 2013-12-08 NOTE — Telephone Encounter (Signed)
Message copied by Marlon Pel on Thu Dec 08, 2013  2:07 PM ------      Message from: Silvano Rusk E      Created: Thu Dec 08, 2013  6:46 AM       She should take it and I told cards that      ----- Message -----         From: Kellie Moor, RN         Sent: 11/28/2013  11:11 AM           To: Gatha Mayer, MD            Neah stopped by today to ask about the safety of a new statin the cardiology wants her to start on. She is concerned about the increased risk of elevating her LFTs with the statin and being on TPN       ------

## 2013-12-08 NOTE — Telephone Encounter (Signed)
Left message for patient to call back  

## 2013-12-09 NOTE — Telephone Encounter (Signed)
Patient notified

## 2013-12-13 ENCOUNTER — Encounter: Payer: Self-pay | Admitting: Internal Medicine

## 2013-12-13 ENCOUNTER — Other Ambulatory Visit: Payer: Self-pay

## 2013-12-13 ENCOUNTER — Ambulatory Visit (AMBULATORY_SURGERY_CENTER): Payer: BC Managed Care – PPO | Admitting: Internal Medicine

## 2013-12-13 VITALS — BP 126/84 | HR 58 | Temp 96.9°F | Resp 18 | Ht 67.5 in | Wt 146.0 lb

## 2013-12-13 DIAGNOSIS — Z86010 Personal history of colonic polyps: Secondary | ICD-10-CM

## 2013-12-13 DIAGNOSIS — K912 Postsurgical malabsorption, not elsewhere classified: Secondary | ICD-10-CM

## 2013-12-13 MED ORDER — SODIUM CHLORIDE 0.9 % IV SOLN: 500.0000 mL | INTRAVENOUS | Status: DC

## 2013-12-13 MED ORDER — CIPROFLOXACIN HCL 500 MG PO TABS: 500.0000 mg | ORAL_TABLET | Freq: Two times a day (BID) | ORAL | Status: DC

## 2013-12-13 NOTE — Progress Notes (Signed)
A/ox3, pleased with MAC, report to RN 

## 2013-12-13 NOTE — Patient Instructions (Signed)
YOU HAD AN ENDOSCOPIC PROCEDURE TODAY AT THE [facility]: Refer to the procedure report that was given to you for any specific questions about what was found during the examination.  If the procedure report does not answer your questions, please call your gastroenterologist to clarify.  If you requested that your care partner not be given the details of your procedure findings, then the procedure report has been included in a sealed envelope for you to review at your convenience later.  YOU SHOULD EXPECT: Some feelings of bloating in the abdomen. Passage of more gas than usual.  Walking can help get rid of the air that was put into your GI tract during the procedure and reduce the bloating. If you had a lower endoscopy (such as a colonoscopy or flexible sigmoidoscopy) you may notice spotting of blood in your stool or on the toilet paper. If you underwent a bowel prep for your procedure, then you may not have a normal bowel movement for a few days.  DIET: Your first meal following the procedure should be a light meal and then it is ok to progress to your normal diet.  A half-sandwich or bowl of soup is an example of a good first meal.  Heavy or fried foods are harder to digest and may make you feel nauseous or bloated.  Likewise meals heavy in dairy and vegetables can cause extra gas to form and this can also increase the bloating.  Drink plenty of fluids but you should avoid alcoholic beverages for 24 hours.  ACTIVITY: Your care partner should take you home directly after the procedure.  You should plan to take it easy, moving slowly for the rest of the day.  You can resume normal activity the day after the procedure however you should NOT DRIVE or use heavy machinery for 24 hours (because of the sedation medicines used during the test).    SYMPTOMS TO REPORT IMMEDIATELY: A gastroenterologist can be reached at any hour.  During normal business hours, 8:30 AM to 5:00 PM Monday through Friday,  call [phone].  After hours and on weekends, please call the GI answering service at [phone] who will take a message and have the physician on call contact you.   Following lower endoscopy (colonoscopy or flexible sigmoidoscopy):  Excessive amounts of blood in the stool  Significant tenderness or worsening of abdominal pains  Swelling of the abdomen that is new, acute  Fever of 100F or higher    FOLLOW UP: If any biopsies were taken you will be contacted by phone or by letter within the next 1-3 weeks.  Call your gastroenterologist if you have not heard about the biopsies in 3 weeks.  Our staff will call the home number listed on your records the next business day following your procedure to check on you and address any questions or concerns that you may have at that time regarding the information given to you following your procedure. This is a courtesy call and so if there is no answer at the home number and we have not heard from you through the emergency physician on call, we will assume that you have returned to your regular daily activities without incident.  SIGNATURES/CONFIDENTIALITY: You and/or your care partner have signed paperwork which will be entered into your electronic medical record.  These signatures attest to the fact that that the information above on your After Visit Summary has been reviewed and is understood.  Full responsibility of the confidentiality   of this discharge information lies with you and/or your care-partner.     

## 2013-12-13 NOTE — Op Note (Signed)
[address]. [place], [id number]   COLONOSCOPY PROCEDURE REPORT  PATIENT: [name], [name]  MR#: [id number] BIRTHDATE: 10-Feb-1951 , 62  yrs. old GENDER: Female ENDOSCOPIST: Gatha Mayer, MD, [facility] PROCEDURE DATE:  12/13/2013 PROCEDURE:   Colonoscopy, surveillance First Screening Colonoscopy - Avg.  risk and is 50 yrs.  old or older - No.      History of Adenoma - Now for follow-up colonoscopy & has been > or = to 3 yrs.  No.  It has been less than 3 yrs since last colonoscopy.  Other: See Comments  Polyps Removed Today? No. Recommend repeat exam, <10 yrs? Yes.  High risk (family or personal hx). ASA CLASS:   Class III INDICATIONS:Patient's personal history of adenomatous colon polyps. also on Gattex - risk of polyp formation MEDICATIONS: propofol (Diprivan) 400mg  IV, MAC sedation, administered by CRNA, and These medications were titrated to patient response per physician's verbal order  DESCRIPTION OF PROCEDURE:   After the risks benefits and alternatives of the procedure were thoroughly explained, informed consent was obtained.  A digital rectal exam revealed no abnormalities of the rectum.   The [id number]  endoscope was introduced through the anus and thought to be advanced to/through the surgical anastomosis. No adverse events experienced. The quality of the prep was good, using MoviPrep  The instrument was then slowly withdrawn as the colon was fully examined.      COLON FINDINGS: Mild diverticulosis was noted The finding was in the left colon.   The colon mucosa was otherwise normal with exam into suspected small bowel but not certain in this patient with jejuno-colic anastomosis. Retroflexed views revealed internal hemorrhods.  .  The scope was withdrawn and the procedure completed. COMPLICATIONS: There were no complications.  ENDOSCOPIC IMPRESSION: 1.   Mild diverticulosis was noted in the left colon 2.   The colon mucosa  was otherwise normal - exam might have been into small bowel but not convinced - need to track down prior photos to see as did not clearly identify an end to side anastomosis as described previously.  RECOMMENDATIONS: Will review imaging of prior procedures, consider hemoccults or Cologuard testing - as not convinced I covered entire colon tdoay   eSigned:  Gatha Mayer, MD, [facility] 12/13/2013 8:48 AM   cc: The Patient

## 2013-12-14 ENCOUNTER — Telehealth: Payer: Self-pay | Admitting: Hematology

## 2013-12-14 ENCOUNTER — Telehealth: Payer: Self-pay

## 2013-12-14 NOTE — Telephone Encounter (Signed)
Unable to leave message answering machine did not pick up.

## 2013-12-14 NOTE — Telephone Encounter (Signed)
, °

## 2013-12-15 ENCOUNTER — Other Ambulatory Visit: Payer: Self-pay | Admitting: *Deleted

## 2013-12-15 DIAGNOSIS — D638 Anemia in other chronic diseases classified elsewhere: Secondary | ICD-10-CM

## 2013-12-15 DIAGNOSIS — D6859 Other primary thrombophilia: Secondary | ICD-10-CM

## 2013-12-16 ENCOUNTER — Other Ambulatory Visit (HOSPITAL_BASED_OUTPATIENT_CLINIC_OR_DEPARTMENT_OTHER): Payer: BC Managed Care – PPO

## 2013-12-16 ENCOUNTER — Ambulatory Visit (HOSPITAL_BASED_OUTPATIENT_CLINIC_OR_DEPARTMENT_OTHER): Payer: BC Managed Care – PPO

## 2013-12-16 VITALS — BP 128/66 | HR 53 | Temp 98.1°F

## 2013-12-16 DIAGNOSIS — D638 Anemia in other chronic diseases classified elsewhere: Secondary | ICD-10-CM

## 2013-12-16 DIAGNOSIS — D6859 Other primary thrombophilia: Secondary | ICD-10-CM

## 2013-12-16 LAB — CBC WITH DIFFERENTIAL/PLATELET
BASO%: 0.7 % (ref 0.0–2.0)
Basophils Absolute: 0 10*3/uL (ref 0.0–0.1)
EOS%: 5.2 % (ref 0.0–7.0)
Eosinophils Absolute: 0.3 10*3/uL (ref 0.0–0.5)
HCT: 31.7 % — ABNORMAL LOW (ref 34.8–46.6)
HEMOGLOBIN: 10.3 g/dL — AB (ref 11.6–15.9)
LYMPH%: 39 % (ref 14.0–49.7)
MCH: 31 pg (ref 25.1–34.0)
MCHC: 32.4 g/dL (ref 31.5–36.0)
MCV: 95.9 fL (ref 79.5–101.0)
MONO#: 0.5 10*3/uL (ref 0.1–0.9)
MONO%: 9.9 % (ref 0.0–14.0)
NEUT#: 2.3 10*3/uL (ref 1.5–6.5)
NEUT%: 45.2 % (ref 38.4–76.8)
Platelets: 109 10*3/uL — ABNORMAL LOW (ref 145–400)
RBC: 3.3 10*6/uL — ABNORMAL LOW (ref 3.70–5.45)
RDW: 13.9 % (ref 11.2–14.5)
WBC: 5.1 10*3/uL (ref 3.9–10.3)
lymph#: 2 10*3/uL (ref 0.9–3.3)

## 2013-12-16 MED ORDER — DARBEPOETIN ALFA-POLYSORBATE 300 MCG/0.6ML IJ SOLN
300.0000 ug | Freq: Once | INTRAMUSCULAR | Status: AC
  Administered 2013-12-16: 300 ug via SUBCUTANEOUS
  Filled 2013-12-16: qty 0.6

## 2013-12-19 ENCOUNTER — Other Ambulatory Visit (INDEPENDENT_AMBULATORY_CARE_PROVIDER_SITE_OTHER): Payer: BC Managed Care – PPO

## 2013-12-19 DIAGNOSIS — K912 Postsurgical malabsorption, not elsewhere classified: Secondary | ICD-10-CM

## 2013-12-19 LAB — COMPREHENSIVE METABOLIC PANEL
ALT: 17 U/L (ref 0–35)
AST: 18 U/L (ref 0–37)
Albumin: 3.3 g/dL — ABNORMAL LOW (ref 3.5–5.2)
Alkaline Phosphatase: 74 U/L (ref 39–117)
BUN: 17 mg/dL (ref 6–23)
CALCIUM: 8.8 mg/dL (ref 8.4–10.5)
CHLORIDE: 104 meq/L (ref 96–112)
CO2: 25 meq/L (ref 19–32)
CREATININE: 1 mg/dL (ref 0.4–1.2)
GFR: 61.52 mL/min (ref >60.00)
GLUCOSE: 91 mg/dL (ref 70–99)
Potassium: 3.6 mEq/L (ref 3.5–5.1)
Sodium: 136 mEq/L (ref 135–145)
Total Bilirubin: 0.4 mg/dL (ref 0.2–1.2)
Total Protein: 7.8 g/dL (ref 6.0–8.3)

## 2013-12-19 LAB — CBC WITH DIFFERENTIAL/PLATELET
BASOS ABS: 0 10*3/uL (ref 0.0–0.1)
Basophils Relative: 0.7 % (ref 0.0–3.0)
EOS PCT: 4 % (ref 0.0–5.0)
Eosinophils Absolute: 0.2 10*3/uL (ref 0.0–0.7)
HCT: 32 % — ABNORMAL LOW (ref 36.0–46.0)
Hemoglobin: 10.7 g/dL — ABNORMAL LOW (ref 12.0–15.0)
LYMPHS PCT: 42 % (ref 12.0–46.0)
Lymphs Abs: 1.9 10*3/uL (ref 0.7–4.0)
MCHC: 33.5 g/dL (ref 30.0–36.0)
MCV: 94.4 fl (ref 78.0–100.0)
Monocytes Absolute: 0.5 10*3/uL (ref 0.1–1.0)
Monocytes Relative: 10 % (ref 3.0–12.0)
Neutro Abs: 2 10*3/uL (ref 1.4–7.7)
Neutrophils Relative %: 43.3 % (ref 43.0–77.0)
Platelets: 107 10*3/uL — ABNORMAL LOW (ref 150.0–400.0)
RBC: 3.4 Mil/uL — AB (ref 3.87–5.11)
RDW: 14.6 % (ref 11.5–15.5)
WBC: 4.6 10*3/uL (ref 4.0–10.5)

## 2013-12-19 LAB — TRIGLYCERIDES: Triglycerides: 129 mg/dL (ref 0.0–149.0)

## 2013-12-19 LAB — PHOSPHORUS: Phosphorus: 3.8 mg/dL (ref 2.3–4.6)

## 2013-12-19 LAB — MAGNESIUM: MAGNESIUM: 1.7 mg/dL (ref 1.5–2.5)

## 2013-12-19 LAB — C-REACTIVE PROTEIN: CRP: <0.5 mg/dL (ref 0.5–20.0)

## 2013-12-19 LAB — BILIRUBIN, DIRECT: BILIRUBIN DIRECT: 0.1 mg/dL (ref 0.0–0.3)

## 2013-12-20 LAB — PREALBUMIN: Prealbumin: 14.8 mg/dL — ABNORMAL LOW (ref 17.0–34.0)

## 2013-12-27 ENCOUNTER — Telehealth: Payer: Self-pay | Admitting: Internal Medicine

## 2013-12-27 ENCOUNTER — Other Ambulatory Visit: Payer: Self-pay | Admitting: *Deleted

## 2013-12-27 DIAGNOSIS — Z860100 Personal history of colon polyps, unspecified: Secondary | ICD-10-CM

## 2013-12-27 DIAGNOSIS — D6859 Other primary thrombophilia: Secondary | ICD-10-CM

## 2013-12-27 NOTE — Telephone Encounter (Signed)
Labs faxed

## 2013-12-28 ENCOUNTER — Ambulatory Visit: Payer: BC Managed Care – PPO

## 2013-12-28 ENCOUNTER — Other Ambulatory Visit: Payer: BC Managed Care – PPO

## 2013-12-28 ENCOUNTER — Encounter: Payer: Self-pay | Admitting: Medical

## 2013-12-28 ENCOUNTER — Ambulatory Visit (INDEPENDENT_AMBULATORY_CARE_PROVIDER_SITE_OTHER): Payer: BC Managed Care – PPO | Admitting: Medical

## 2013-12-28 VITALS — BP 114/73 | HR 71 | Temp 98.8°F | Resp 99 | Wt 145.0 lb

## 2013-12-28 DIAGNOSIS — R109 Unspecified abdominal pain: Secondary | ICD-10-CM | POA: Insufficient documentation

## 2013-12-28 MED ORDER — MUPIROCIN 2 % EX OINT: 1.0000 "application " | TOPICAL_OINTMENT | Freq: Two times a day (BID) | CUTANEOUS | Status: DC

## 2013-12-28 NOTE — Patient Instructions (Signed)
For your diarrhea and vomiting last night(which appears better now), I offered labs today but you declined since you feel much better. However if symptoms worsen again just let us know and would place that order for labs. Also if diarrhea reoccurs then let us know and I  would do stool panel studies. Currently since you feel significantly improved you may have had acute viral illness or food poisoning resolving quickly. For your left forearm area I will prescribe topical antibiotic. Looks like possible mild skin infection. Oral antibiotics appear not to be indicated and may cause loose stools. Follow up in 7 days any persisting symptoms or as needed.

## 2013-12-28 NOTE — Progress Notes (Signed)
Pre visit review using our clinic review tool, if applicable. No additional management support is needed unless otherwise documented below in the visit note. 

## 2013-12-28 NOTE — Assessment & Plan Note (Signed)
Was generalized last night with diarrhea and vomiting. Maybe was food poising or viral illness. Pt already feels better. Her symptoms may have been impacted by short bowel syndrome. Pt declines any labs presently or any stool studies.  She has lomotil at home. She has zofran as well. I advised she could take total of zofran 8 mg po tid prn nausea or vomiting. Follow up in 7 days or as needed.

## 2013-12-28 NOTE — Progress Notes (Signed)
Subjective:    Patient ID: [name] [name], female    DOB: 1951-03-15, 63 y.o.   MRN: [id number]  HPI  Pt in with onset of diarrhea last night. She has short bowel so she states this is case since surgery.(2006) Pt has 4 days of TPN. She hydrated herself with 400 ml last night. Also some gatorade. She had sloppy Joes last night and squash. This was before loose stools started. She estimates 8 watery stools. Today she does feel better but still tired. Mild body aches. Good color to skin. Pt takes Gatex injection. Takes for better absorption. Subjective fever last night.   Pt tied lomotil and zofran. Did not help last night. But now she is feeling a lot better. She almost did not come in feeling so good now  Last week dog scratched her forearm with paw. Was not bite. Faint dry yellow color over scab.  Pt states diarrhea seems resolved. No nausea. No abdominal cramps.     Past Medical History  Diagnosis Date  . 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 Abnormal LFTs   . Vitamin D deficiency   . Personal history of colonic polyps   . Thrombophilia   . Diabetes mellitus     resolved after weight loss  . Obesity     prior to weight loss  . Allergic rhinosinusitis   . At risk for dental problems   . Fracture of left clavicle   . Osteoporosis   . Anemia of chronic disease   . Renal insufficiency   . Atypical nevus   . Pathologic fracture of neck of femur   . Pancytopenia 10/07/2011  . Small bowel ischemia   . Bacterial overgrowth syndrome   . Splenomegaly     By ultrasound  . 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 Marital Status: Married    Spouse Name: N/A    Number of Children: N/A  . Years of Education: N/A   Occupational History  . Not on file.   Social History Main Topics  . Smoking status: Former Smoker    Types: Cigarettes    Quit date: 05/12/2004  . Smokeless tobacco: Never Used  . Alcohol Use: No  . Drug Use: No    . Sexual Activity: Not Currently   Other Topics Concern  . Not on file   Social History Narrative  . No narrative on file    Past Surgical History  Procedure Laterality Date  . Small intestine surgery  2005    multiple with right colon resection for ischemia/infarct  . Cholecystectomy    . Orif proximal femoral fracture w/ itst nail system  03/2007    left, Dr. Shellia Carwin  . Esophagogastroduodenoscopy  01/22/2009    erosive esophagitis  . Colonoscopy  12/05/2005    internal hemorrhoids (for polyp surveillance)  . Colonoscopy  04/26/2012    Procedure: COLONOSCOPY;  Surgeon: Gatha Mayer, MD;  Location: [facility];  Service: Endoscopy;  Laterality: N/A;    Family History  Problem Relation Age of Onset  . Diabetes Mother   . Hypertension Mother   . Colon cancer Neg Hx   . Stomach cancer Neg Hx     Allergies  Allergen Reactions  . Iohexol Itching    IVP Dye ok if taken with benadryl  . Penicillins Itching    Current Outpatient Prescriptions on File Prior to Visit  Medication Sig  Dispense Refill  . ADULT TPN Inject 2,100 mLs into the vein continuous. Infuse 2154ml over 10 hours with 1 hour ramp up and 1 hour ramp down. Total bag per day provides: 250g Dextrose 70%, 100g Travasol 10%, 45g Intralipids 30%, various electrolytes, MVI, TE, Zinc, selenium.      Marland Kitchen aspirin EC 81 MG tablet Take 1 tablet (81 mg total) by mouth daily.      . ciprofloxacin (CIPRO) 500 MG tablet Take 1 tablet (500 mg total) by mouth 2 (two) times daily.  28 tablet  5  . diphenhydrAMINE (BENADRYL) 25 mg capsule Take 25 mg by mouth every 6 (six) hours as needed. For itching/sleep.      . diphenoxylate-atropine (LOMOTIL) 2.5-0.025 MG per tablet Take 2 tablets by mouth 1/2 hour before meals.  180 tablet  1  . loperamide (IMODIUM A-D) 2 MG tablet Take 2 tablets 30 minutes prior to each meal  180 tablet  6  . Multiple Vitamin (MULTIVITAMIN WITH MINERALS) TABS Take 1 tablet by mouth daily.      Marland Kitchen omeprazole  (PRILOSEC) 40 MG capsule TAKE ONE CAPSULE BY MOUTH TWICE DAILY AT 10 AM AND 5 PM  60 capsule  5  . ondansetron (ZOFRAN) 4 MG tablet Take 1 tablet (4 mg total) by mouth every 8 (eight) hours as needed for nausea or vomiting.  60 tablet  1  . pravastatin (PRAVACHOL) 20 MG tablet Take 1 tablet (20 mg total) by mouth every evening.  30 tablet  11  . Teduglutide, rDNA, (GATTEX) 5 MG KIT Inject 3.3 Units into the skin daily.  1 kit  5   No current facility-administered medications on file prior to visit.    BP 114/73  Pulse 71  Temp(Src) 98.8 F (37.1 C)  Resp 99  Wt 145 lb (65.772 kg)  SpO2 99%     Review of Systems  Constitutional: Positive for fever and fatigue. Negative for chills.       Subjective fever last night. Not now. Her energy improved now.  Overall feeling better and almost did not come in.  Respiratory: Negative for cough, choking, chest tightness and wheezing.   Cardiovascular: Negative for chest pain and palpitations.  Gastrointestinal: Positive for vomiting, abdominal pain and diarrhea. Negative for nausea, constipation, blood in stool, abdominal distention, anal bleeding and rectal pain.       Abdomen cramping,vomiting and diarrhea last night. Now resolved.  Endocrine: Negative for polydipsia, polyphagia and polyuria.  Musculoskeletal: Negative.   Neurological: Negative for dizziness, syncope, speech difficulty, weakness, light-headedness and headaches.  Hematological: Negative for adenopathy. Does not bruise/bleed easily.       Objective:   Physical Exam  General Appearance- Not in acute distress.  HEENT Eyes- Scleraeral/Conjuntiva-bilat- Not Yellow. Mouth & Throat- Normal.  Chest and Lung Exam Auscultation: Breath sounds:-Normal. Adventitious sounds:- No Adventitious sounds.  Cardiovascular Auscultation:Rythm - Regular. Heart Sounds -Normal heart sounds.  Abdomen Inspection:-Inspection Normal.  Palpation/Perucssion: Palpation reveal- Non Tender, No  Rebound tenderness, No rigidity(Guarding) and No Palpable abdominal masses.  Liver:-Normal.  Spleen:- Normal.   Lt forearm- small 8 mm scab honey crust overlying.       Assessment & Plan:

## 2013-12-29 ENCOUNTER — Telehealth: Payer: Self-pay | Admitting: Hematology

## 2013-12-29 ENCOUNTER — Ambulatory Visit: Payer: BC Managed Care – PPO | Admitting: Oncology

## 2013-12-29 ENCOUNTER — Other Ambulatory Visit: Payer: BC Managed Care – PPO

## 2013-12-29 NOTE — Telephone Encounter (Signed)
returned pt call and no vm or answer

## 2014-01-09 ENCOUNTER — Other Ambulatory Visit (INDEPENDENT_AMBULATORY_CARE_PROVIDER_SITE_OTHER): Payer: BC Managed Care – PPO

## 2014-01-09 DIAGNOSIS — K912 Postsurgical malabsorption, not elsewhere classified: Secondary | ICD-10-CM

## 2014-01-09 DIAGNOSIS — I6529 Occlusion and stenosis of unspecified carotid artery: Secondary | ICD-10-CM

## 2014-01-09 LAB — LIPID PANEL
Cholesterol: 78 mg/dL (ref 0–200)
HDL: 19.4 mg/dL — ABNORMAL LOW (ref >39.00)
LDL CALC: 25 mg/dL (ref 0–99)
NonHDL: 58.6
TRIGLYCERIDES: 167 mg/dL — AB (ref 0.0–149.0)
Total CHOL/HDL Ratio: 4
VLDL: 33.4 mg/dL (ref 0.0–40.0)

## 2014-01-09 LAB — BILIRUBIN, DIRECT: Bilirubin, Direct: 0.1 mg/dL (ref 0.0–0.3)

## 2014-01-09 LAB — COMPREHENSIVE METABOLIC PANEL
ALT: 22 U/L (ref 0–35)
AST: 24 U/L (ref 0–37)
Albumin: 3.1 g/dL — ABNORMAL LOW (ref 3.5–5.2)
Alkaline Phosphatase: 69 U/L (ref 39–117)
BUN: 20 mg/dL (ref 6–23)
CALCIUM: 8.6 mg/dL (ref 8.4–10.5)
CHLORIDE: 109 meq/L (ref 96–112)
CO2: 21 meq/L (ref 19–32)
Creatinine, Ser: 1.1 mg/dL (ref 0.4–1.2)
GFR: 53.77 mL/min — AB (ref >60.00)
Glucose, Bld: 78 mg/dL (ref 70–99)
Potassium: 3.4 mEq/L — ABNORMAL LOW (ref 3.5–5.1)
SODIUM: 138 meq/L (ref 135–145)
TOTAL PROTEIN: 7.5 g/dL (ref 6.0–8.3)
Total Bilirubin: 0.6 mg/dL (ref 0.2–1.2)

## 2014-01-09 LAB — CBC WITH DIFFERENTIAL/PLATELET
BASOS ABS: 0 10*3/uL (ref 0.0–0.1)
Basophils Relative: 0.6 % (ref 0.0–3.0)
Eosinophils Absolute: 0.3 10*3/uL (ref 0.0–0.7)
Eosinophils Relative: 6.4 % — ABNORMAL HIGH (ref 0.0–5.0)
HCT: 34.6 % — ABNORMAL LOW (ref 36.0–46.0)
HEMOGLOBIN: 11.3 g/dL — AB (ref 12.0–15.0)
Lymphocytes Relative: 38.5 % (ref 12.0–46.0)
Lymphs Abs: 1.8 10*3/uL (ref 0.7–4.0)
MCHC: 32.6 g/dL (ref 30.0–36.0)
MCV: 94.6 fl (ref 78.0–100.0)
MONOS PCT: 7.8 % (ref 3.0–12.0)
Monocytes Absolute: 0.4 10*3/uL (ref 0.1–1.0)
Neutro Abs: 2.2 10*3/uL (ref 1.4–7.7)
Neutrophils Relative %: 46.7 % (ref 43.0–77.0)
PLATELETS: 97 10*3/uL — AB (ref 150.0–400.0)
RBC: 3.66 Mil/uL — ABNORMAL LOW (ref 3.87–5.11)
RDW: 14.1 % (ref 11.5–15.5)
WBC: 4.6 10*3/uL (ref 4.0–10.5)

## 2014-01-09 LAB — PHOSPHORUS: PHOSPHORUS: 3.8 mg/dL (ref 2.3–4.6)

## 2014-01-09 LAB — HEPATIC FUNCTION PANEL
ALBUMIN: 3.1 g/dL — AB (ref 3.5–5.2)
ALT: 22 U/L (ref 0–35)
AST: 24 U/L (ref 0–37)
Alkaline Phosphatase: 69 U/L (ref 39–117)
BILIRUBIN TOTAL: 0.6 mg/dL (ref 0.2–1.2)
Bilirubin, Direct: 0.1 mg/dL (ref 0.0–0.3)
TOTAL PROTEIN: 7.5 g/dL (ref 6.0–8.3)

## 2014-01-09 LAB — C-REACTIVE PROTEIN: CRP: <0.5 mg/dL (ref 0.5–20.0)

## 2014-01-09 LAB — PREALBUMIN: Prealbumin: 18.4 mg/dL (ref 17.0–34.0)

## 2014-01-09 LAB — MAGNESIUM: Magnesium: 1.7 mg/dL (ref 1.5–2.5)

## 2014-01-09 LAB — TRIGLYCERIDES: Triglycerides: 167 mg/dL — ABNORMAL HIGH (ref 0.0–149.0)

## 2014-01-09 NOTE — Progress Notes (Signed)
Quick Note:  Please forward to TPN service ______

## 2014-01-10 NOTE — Progress Notes (Signed)
Quick Note:  Forward to TPN agency ______

## 2014-01-11 ENCOUNTER — Telehealth: Payer: Self-pay | Admitting: *Deleted

## 2014-01-11 NOTE — Telephone Encounter (Signed)
pt notified about lab results with verbal understanding  

## 2014-01-13 ENCOUNTER — Ambulatory Visit: Payer: BC Managed Care – PPO

## 2014-01-13 ENCOUNTER — Other Ambulatory Visit (HOSPITAL_BASED_OUTPATIENT_CLINIC_OR_DEPARTMENT_OTHER): Payer: BC Managed Care – PPO

## 2014-01-13 DIAGNOSIS — D638 Anemia in other chronic diseases classified elsewhere: Secondary | ICD-10-CM

## 2014-01-13 DIAGNOSIS — Z86010 Personal history of colonic polyps: Secondary | ICD-10-CM

## 2014-01-13 DIAGNOSIS — D6859 Other primary thrombophilia: Secondary | ICD-10-CM

## 2014-01-13 LAB — CBC WITH DIFFERENTIAL/PLATELET
BASO%: 1.1 % (ref 0.0–2.0)
Basophils Absolute: 0.1 10*3/uL (ref 0.0–0.1)
EOS%: 4.8 % (ref 0.0–7.0)
Eosinophils Absolute: 0.3 10*3/uL (ref 0.0–0.5)
HCT: 38.6 % (ref 34.8–46.6)
HGB: 12.2 g/dL (ref 11.6–15.9)
LYMPH#: 1.9 10*3/uL (ref 0.9–3.3)
LYMPH%: 31.8 % (ref 14.0–49.7)
MCH: 30.3 pg (ref 25.1–34.0)
MCHC: 31.7 g/dL (ref 31.5–36.0)
MCV: 95.9 fL (ref 79.5–101.0)
MONO#: 0.5 10*3/uL (ref 0.1–0.9)
MONO%: 8 % (ref 0.0–14.0)
NEUT%: 54.3 % (ref 38.4–76.8)
NEUTROS ABS: 3.3 10*3/uL (ref 1.5–6.5)
Platelets: 116 10*3/uL — ABNORMAL LOW (ref 145–400)
RBC: 4.03 10*6/uL (ref 3.70–5.45)
RDW: 14.2 % (ref 11.2–14.5)
WBC: 6 10*3/uL (ref 3.9–10.3)

## 2014-01-13 LAB — COMPREHENSIVE METABOLIC PANEL (CC13)
ALBUMIN: 3.4 g/dL — AB (ref 3.5–5.0)
ALT: 20 U/L (ref 0–55)
AST: 19 U/L (ref 5–34)
Alkaline Phosphatase: 85 U/L (ref 40–150)
Anion Gap: 6 mEq/L (ref 3–11)
BUN: 14.6 mg/dL (ref 7.0–26.0)
CO2: 21 meq/L — AB (ref 22–29)
Calcium: 8.9 mg/dL (ref 8.4–10.4)
Chloride: 111 mEq/L — ABNORMAL HIGH (ref 98–109)
Creatinine: 1 mg/dL (ref 0.6–1.1)
Glucose: 95 mg/dl (ref 70–140)
POTASSIUM: 3.6 meq/L (ref 3.5–5.1)
SODIUM: 139 meq/L (ref 136–145)
TOTAL PROTEIN: 8.1 g/dL (ref 6.4–8.3)
Total Bilirubin: 0.26 mg/dL (ref 0.20–1.20)

## 2014-01-13 MED ORDER — DARBEPOETIN ALFA-POLYSORBATE 500 MCG/ML IJ SOLN: 300.0000 ug | Freq: Once | INTRAMUSCULAR | Status: DC

## 2014-01-24 ENCOUNTER — Ambulatory Visit (HOSPITAL_BASED_OUTPATIENT_CLINIC_OR_DEPARTMENT_OTHER): Payer: BC Managed Care – PPO | Admitting: Radiology

## 2014-01-24 ENCOUNTER — Ambulatory Visit (HOSPITAL_COMMUNITY): Payer: BC Managed Care – PPO | Attending: Physician Assistant | Admitting: Cardiology

## 2014-01-24 VITALS — BP 108/45 | Ht 67.0 in | Wt 145.0 lb

## 2014-01-24 DIAGNOSIS — F17200 Nicotine dependence, unspecified, uncomplicated: Secondary | ICD-10-CM | POA: Insufficient documentation

## 2014-01-24 DIAGNOSIS — I447 Left bundle-branch block, unspecified: Secondary | ICD-10-CM

## 2014-01-24 DIAGNOSIS — I6529 Occlusion and stenosis of unspecified carotid artery: Secondary | ICD-10-CM | POA: Insufficient documentation

## 2014-01-24 DIAGNOSIS — R9431 Abnormal electrocardiogram [ECG] [EKG]: Secondary | ICD-10-CM

## 2014-01-24 DIAGNOSIS — I428 Other cardiomyopathies: Secondary | ICD-10-CM

## 2014-01-24 MED ORDER — TECHNETIUM TC 99M SESTAMIBI GENERIC - CARDIOLITE
30.0000 | Freq: Once | INTRAVENOUS | Status: AC | PRN
  Administered 2014-01-24: 30 via INTRAVENOUS

## 2014-01-24 MED ORDER — AMINOPHYLLINE 25 MG/ML IV SOLN
75.0000 mg | Freq: Once | INTRAVENOUS | Status: AC
  Administered 2014-01-24: 75 mg via INTRAVENOUS

## 2014-01-24 MED ORDER — TECHNETIUM TC 99M SESTAMIBI GENERIC - CARDIOLITE
10.0000 | Freq: Once | INTRAVENOUS | Status: AC | PRN
  Administered 2014-01-24: 10 via INTRAVENOUS

## 2014-01-24 MED ORDER — REGADENOSON 0.4 MG/5ML IV SOLN
0.4000 mg | Freq: Once | INTRAVENOUS | Status: AC
Start: 2014-01-24 — End: 2014-01-24
  Administered 2014-01-24: 0.4 mg via INTRAVENOUS

## 2014-01-24 NOTE — Progress Notes (Signed)
Carotid duplex performed 

## 2014-01-24 NOTE — Progress Notes (Signed)
[facility] 3 NUCLEAR MED [place], [place] [address] [phone]    Cardiology Nuclear Med Study  [name] [name] is a 63 y.o. female     MRN : [id number]     DOB: [birth year]  Procedure Date: 01/24/2014  Nuclear Med Background Indication for Stress Test:  Evaluation for Ischemia and Abnormal EKG History:  12/26/11 Scar EF: 33%  Cardiac Risk Factors: Carotid Disease  Symptoms:  Fatigue   Nuclear Pre-Procedure Caffeine/Decaff Intake:  None> 12 hrs NPO After: 7:00am   Lungs:  clear O2 Sat: 98% on room air. IV 0.9% NS with Angio Cath:  22g  IV Site: R Antecubital x 1, tolerated well IV Started by:  Irven Baltimore, RN  Chest Size (in):  40 Cup Size: DD  Height: 5\' 7"  (1.702 m)  Weight:  145 lb (65.772 kg)  BMI:  Body mass index is 22.71 kg/(m^2). Tech Comments:  Aminophylline 75 mg IV given for symptoms. All were resolved leaving.    Nuclear Med Study 1 or 2 day study: 1 day  Stress Test Type:  Carlton Adam  Reading MD: N/A  Order Authorizing Provider:  Jenkins Rouge, MD, and Richardson Dopp, [facility]  Resting Radionuclide: Technetium 31m Sestamibi  Resting Radionuclide Dose: 11.0 mCi   Stress Radionuclide:  Technetium 62m Sestamibi  Stress Radionuclide Dose: 33.0 mCi           Stress Protocol Rest HR: 57 Stress HR: 101  Rest BP: 108/45 Stress BP: 144/62  Exercise Time (min): n/a METS: n/a   Predicted Max HR: 157 bpm % Max HR: 64.33 bpm Rate Pressure Product: 14544   Dose of Adenosine (mg):  n/a Dose of Lexiscan: 0.4 mg  Dose of Atropine (mg): n/a Dose of Dobutamine: n/a mcg/kg/min (at max HR)  Stress Test Technologist: Perrin Maltese, EMT-P  Nuclear Technologist:  Annye Rusk, CNMT     Rest Procedure:  Myocardial perfusion imaging was performed at rest 45 minutes following the intravenous administration of Technetium 46m Sestamibi. Rest ECG: NSR-LBBB  Stress Procedure:  The patient received IV Lexiscan 0.4 mg over 15-seconds.  Technetium 70m  Sestamibi injected at 30-seconds. This patient was lt. Headed, had a headache, and was nauseated with the Lexiscan injection. Quantitative spect images were obtained after a 45 minute delay. Stress ECG: No significant change from baseline ECG  QPS Raw Data Images:  There is interference from nuclear activity from structures below the diaphragm. This does not affect the ability to read the study. Stress Images:  There is a large, severe defect in the inferior wall and apex. Rest Images:  There is a large, severe defect in the apical inferior region.   Subtraction (SDS):  No evidence of ischemia.  There is a previous apical inferior MI Transient Ischemic Dilatation (Normal <1.22):  1.05 Lung/Heart Ratio (Normal <0.45):  0.38  Quantitative Gated Spect Images QGS EDV:  144 ml QGS ESV:  77 ml  Impression Exercise Capacity:  Lexiscan with no exercise. BP Response:  Normal blood pressure response. Clinical Symptoms:  No significant symptoms noted. ECG Impression:  No significant ST segment change suggestive of ischemia. Comparison with Prior Nuclear Study: No significant change from previous study 12/26/11  Overall Impression:  Low risk stress nuclear study .  The patient has had a previous inferoapical MI.  There is no  ischemia.  LV Ejection Fraction: 47%.  LV Wall Motion:  There is akinesis of the apex and mid/apical inferior wall.  Thayer Headings, Brooke Bonito., MD, [facility] 01/24/2014, 4:59 PM [address],  [place] Pager [phone]

## 2014-01-25 ENCOUNTER — Encounter: Payer: Self-pay | Admitting: Physician Assistant

## 2014-01-25 ENCOUNTER — Telehealth: Payer: Self-pay | Admitting: *Deleted

## 2014-01-25 NOTE — Telephone Encounter (Signed)
pt notified about both myoview and carotid results with verbal understanding to results given today. Pt scheduled today for Dr. Johnsie Cancel 121/16/15 @ 9 am.

## 2014-01-25 NOTE — Telephone Encounter (Signed)
no answer, will try again later

## 2014-01-30 ENCOUNTER — Other Ambulatory Visit (INDEPENDENT_AMBULATORY_CARE_PROVIDER_SITE_OTHER): Payer: BC Managed Care – PPO

## 2014-01-30 DIAGNOSIS — K912 Postsurgical malabsorption, not elsewhere classified: Secondary | ICD-10-CM

## 2014-01-30 LAB — COMPREHENSIVE METABOLIC PANEL
ALK PHOS: 75 U/L (ref 39–117)
ALT: 20 U/L (ref 0–35)
AST: 19 U/L (ref 0–37)
Albumin: 3.3 g/dL — ABNORMAL LOW (ref 3.5–5.2)
BILIRUBIN TOTAL: 0.4 mg/dL (ref 0.2–1.2)
BUN: 23 mg/dL (ref 6–23)
CO2: 25 mEq/L (ref 19–32)
CREATININE: 1 mg/dL (ref 0.4–1.2)
Calcium: 8.9 mg/dL (ref 8.4–10.5)
Chloride: 110 mEq/L (ref 96–112)
GFR: 58.7 mL/min — ABNORMAL LOW (ref >60.00)
Glucose, Bld: 83 mg/dL (ref 70–99)
Potassium: 3.6 mEq/L (ref 3.5–5.1)
SODIUM: 138 meq/L (ref 135–145)
Total Protein: 7.7 g/dL (ref 6.0–8.3)

## 2014-01-30 LAB — CBC WITH DIFFERENTIAL/PLATELET
BASOS ABS: 0 10*3/uL (ref 0.0–0.1)
Basophils Relative: 0.3 % (ref 0.0–3.0)
EOS ABS: 0.3 10*3/uL (ref 0.0–0.7)
Eosinophils Relative: 6.4 % — ABNORMAL HIGH (ref 0.0–5.0)
HCT: 33.7 % — ABNORMAL LOW (ref 36.0–46.0)
Hemoglobin: 11.1 g/dL — ABNORMAL LOW (ref 12.0–15.0)
LYMPHS ABS: 2.3 10*3/uL (ref 0.7–4.0)
Lymphocytes Relative: 43.9 % (ref 12.0–46.0)
MCHC: 32.9 g/dL (ref 30.0–36.0)
MCV: 93.4 fl (ref 78.0–100.0)
MONO ABS: 0.5 10*3/uL (ref 0.1–1.0)
MONOS PCT: 9.9 % (ref 3.0–12.0)
Neutro Abs: 2 10*3/uL (ref 1.4–7.7)
Neutrophils Relative %: 39.5 % — ABNORMAL LOW (ref 43.0–77.0)
PLATELETS: 85 10*3/uL — AB (ref 150.0–400.0)
RBC: 3.61 Mil/uL — ABNORMAL LOW (ref 3.87–5.11)
RDW: 14.1 % (ref 11.5–15.5)
WBC: 5.2 10*3/uL (ref 4.0–10.5)

## 2014-02-01 ENCOUNTER — Other Ambulatory Visit: Payer: Self-pay

## 2014-02-01 ENCOUNTER — Ambulatory Visit: Payer: BC Managed Care – PPO

## 2014-02-01 DIAGNOSIS — E639 Nutritional deficiency, unspecified: Secondary | ICD-10-CM

## 2014-02-01 LAB — MAGNESIUM: MAGNESIUM: 1.8 mg/dL (ref 1.5–2.5)

## 2014-02-01 LAB — PHOSPHORUS: PHOSPHORUS: 4 mg/dL (ref 2.3–4.6)

## 2014-02-01 NOTE — Progress Notes (Signed)
Quick Note:  Please forward to home TPN agency ______ 

## 2014-02-01 NOTE — Progress Notes (Signed)
Quick Note:  please forward to TPN agency ______

## 2014-02-09 ENCOUNTER — Other Ambulatory Visit: Payer: Self-pay

## 2014-02-10 ENCOUNTER — Other Ambulatory Visit: Payer: BC Managed Care – PPO

## 2014-02-10 ENCOUNTER — Ambulatory Visit: Payer: BC Managed Care – PPO

## 2014-02-10 DIAGNOSIS — I82622 Acute embolism and thrombosis of deep veins of left upper extremity: Secondary | ICD-10-CM

## 2014-02-10 DIAGNOSIS — D638 Anemia in other chronic diseases classified elsewhere: Secondary | ICD-10-CM

## 2014-02-10 DIAGNOSIS — D693 Immune thrombocytopenic purpura: Secondary | ICD-10-CM

## 2014-02-10 LAB — CBC WITH DIFFERENTIAL/PLATELET
BASO%: 1 % (ref 0.0–2.0)
Basophils Absolute: 0.1 10*3/uL (ref 0.0–0.1)
EOS%: 7.1 % — ABNORMAL HIGH (ref 0.0–7.0)
Eosinophils Absolute: 0.4 10*3/uL (ref 0.0–0.5)
HCT: 36.7 % (ref 34.8–46.6)
HGB: 11.7 g/dL (ref 11.6–15.9)
LYMPH%: 42.4 % (ref 14.0–49.7)
MCH: 30 pg (ref 25.1–34.0)
MCHC: 31.8 g/dL (ref 31.5–36.0)
MCV: 94.1 fL (ref 79.5–101.0)
MONO#: 0.6 10*3/uL (ref 0.1–0.9)
MONO%: 10.9 % (ref 0.0–14.0)
NEUT#: 2 10*3/uL (ref 1.5–6.5)
NEUT%: 38.6 % (ref 38.4–76.8)
PLATELETS: 112 10*3/uL — AB (ref 145–400)
RBC: 3.9 10*6/uL (ref 3.70–5.45)
RDW: 13.9 % (ref 11.2–14.5)
WBC: 5.2 10*3/uL (ref 3.9–10.3)
lymph#: 2.2 10*3/uL (ref 0.9–3.3)

## 2014-02-10 MED ORDER — DARBEPOETIN ALFA-POLYSORBATE 500 MCG/ML IJ SOLN: 300.0000 ug | Freq: Once | INTRAMUSCULAR | Status: DC

## 2014-02-20 ENCOUNTER — Other Ambulatory Visit (INDEPENDENT_AMBULATORY_CARE_PROVIDER_SITE_OTHER): Payer: BC Managed Care – PPO

## 2014-02-20 DIAGNOSIS — E639 Nutritional deficiency, unspecified: Secondary | ICD-10-CM

## 2014-02-20 LAB — MAGNESIUM: Magnesium: 2 mg/dL (ref 1.5–2.5)

## 2014-02-20 LAB — PHOSPHORUS: PHOSPHORUS: 3.5 mg/dL (ref 2.3–4.6)

## 2014-02-21 NOTE — Progress Notes (Signed)
Quick Note:  Please forwrad to home TPN agency ______

## 2014-02-27 ENCOUNTER — Telehealth: Payer: Self-pay | Admitting: Internal Medicine

## 2014-02-27 DIAGNOSIS — K912 Postsurgical malabsorption, not elsewhere classified: Secondary | ICD-10-CM

## 2014-02-27 NOTE — Telephone Encounter (Signed)
[name] [name] from [facility] labs are entered

## 2014-02-28 ENCOUNTER — Ambulatory Visit (INDEPENDENT_AMBULATORY_CARE_PROVIDER_SITE_OTHER): Payer: BC Managed Care – PPO

## 2014-02-28 DIAGNOSIS — Z23 Encounter for immunization: Secondary | ICD-10-CM

## 2014-03-03 ENCOUNTER — Other Ambulatory Visit (INDEPENDENT_AMBULATORY_CARE_PROVIDER_SITE_OTHER): Payer: BC Managed Care – PPO

## 2014-03-03 DIAGNOSIS — E639 Nutritional deficiency, unspecified: Secondary | ICD-10-CM

## 2014-03-03 DIAGNOSIS — K912 Postsurgical malabsorption, not elsewhere classified: Secondary | ICD-10-CM

## 2014-03-03 LAB — CALCIUM: CALCIUM: 8.8 mg/dL (ref 8.4–10.5)

## 2014-03-03 LAB — CBC WITH DIFFERENTIAL/PLATELET
BASOS PCT: 0.6 % (ref 0.0–3.0)
Basophils Absolute: 0 10*3/uL (ref 0.0–0.1)
EOS PCT: 6.4 % — AB (ref 0.0–5.0)
Eosinophils Absolute: 0.4 10*3/uL (ref 0.0–0.7)
HEMATOCRIT: 35.2 % — AB (ref 36.0–46.0)
Hemoglobin: 11.4 g/dL — ABNORMAL LOW (ref 12.0–15.0)
LYMPHS ABS: 2.4 10*3/uL (ref 0.7–4.0)
Lymphocytes Relative: 42.9 % (ref 12.0–46.0)
MCHC: 32.4 g/dL (ref 30.0–36.0)
MCV: 93.5 fl (ref 78.0–100.0)
MONOS PCT: 9.8 % (ref 3.0–12.0)
Monocytes Absolute: 0.5 10*3/uL (ref 0.1–1.0)
Neutro Abs: 2.2 10*3/uL (ref 1.4–7.7)
Neutrophils Relative %: 40.3 % — ABNORMAL LOW (ref 43.0–77.0)
PLATELETS: 110 10*3/uL — AB (ref 150.0–400.0)
RBC: 3.77 Mil/uL — ABNORMAL LOW (ref 3.87–5.11)
RDW: 14.8 % (ref 11.5–15.5)
WBC: 5.5 10*3/uL (ref 4.0–10.5)

## 2014-03-03 LAB — TRIGLYCERIDES: Triglycerides: 172 mg/dL — ABNORMAL HIGH (ref 0.0–149.0)

## 2014-03-03 LAB — MAGNESIUM
MAGNESIUM: 1.9 mg/dL (ref 1.5–2.5)
Magnesium: 1.8 mg/dL (ref 1.5–2.5)

## 2014-03-03 LAB — COMPREHENSIVE METABOLIC PANEL
ALBUMIN: 3.1 g/dL — AB (ref 3.5–5.2)
ALT: 21 U/L (ref 0–35)
AST: 20 U/L (ref 0–37)
Alkaline Phosphatase: 80 U/L (ref 39–117)
BUN: 17 mg/dL (ref 6–23)
CO2: 22 meq/L (ref 19–32)
Calcium: 8.8 mg/dL (ref 8.4–10.5)
Chloride: 108 mEq/L (ref 96–112)
Creatinine, Ser: 1.1 mg/dL (ref 0.4–1.2)
GFR: 53.74 mL/min — AB (ref >60.00)
GLUCOSE: 95 mg/dL (ref 70–99)
POTASSIUM: 3.3 meq/L — AB (ref 3.5–5.1)
SODIUM: 136 meq/L (ref 135–145)
Total Bilirubin: 0.7 mg/dL (ref 0.2–1.2)
Total Protein: 8.2 g/dL (ref 6.0–8.3)

## 2014-03-03 LAB — ALBUMIN: Albumin: 3.1 g/dL — ABNORMAL LOW (ref 3.5–5.2)

## 2014-03-03 LAB — PHOSPHORUS
PHOSPHORUS: 3.9 mg/dL (ref 2.3–4.6)
Phosphorus: 4 mg/dL (ref 2.3–4.6)

## 2014-03-03 LAB — C-REACTIVE PROTEIN: CRP: <0.5 mg/dL (ref 0.5–20.0)

## 2014-03-04 LAB — PREALBUMIN: Prealbumin: 20.1 mg/dL (ref 17.0–34.0)

## 2014-03-07 NOTE — Progress Notes (Signed)
Quick Note:  Please forward to TPN agency ______

## 2014-03-08 ENCOUNTER — Other Ambulatory Visit: Payer: Self-pay | Admitting: Nurse Practitioner

## 2014-03-10 ENCOUNTER — Ambulatory Visit: Payer: BC Managed Care – PPO

## 2014-03-10 ENCOUNTER — Other Ambulatory Visit: Payer: BC Managed Care – PPO

## 2014-03-13 ENCOUNTER — Telehealth: Payer: Self-pay | Admitting: Internal Medicine

## 2014-03-13 ENCOUNTER — Ambulatory Visit (HOSPITAL_COMMUNITY)
Admission: RE | Admit: 2014-03-13 | Discharge: 2014-03-13 | Disposition: A | Discharge status: Home / Self Care | Payer: BC Managed Care – PPO | Source: Ambulatory Visit | Attending: Internal Medicine | Admitting: Internal Medicine

## 2014-03-13 ENCOUNTER — Other Ambulatory Visit: Payer: Self-pay

## 2014-03-13 DIAGNOSIS — R6883 Chills (without fever): Secondary | ICD-10-CM

## 2014-03-13 DIAGNOSIS — K912 Postsurgical malabsorption, not elsewhere classified: Secondary | ICD-10-CM | POA: Insufficient documentation

## 2014-03-13 DIAGNOSIS — Z452 Encounter for adjustment and management of vascular access device: Secondary | ICD-10-CM | POA: Insufficient documentation

## 2014-03-13 DIAGNOSIS — Z789 Other specified health status: Secondary | ICD-10-CM

## 2014-03-13 MED ORDER — LIDOCAINE HCL 1 % IJ SOLN
INTRAMUSCULAR | Status: AC
  Filled 2014-03-13: qty 20

## 2014-03-13 MED ORDER — CHLORHEXIDINE GLUCONATE 4 % EX LIQD
CUTANEOUS | Status: AC
  Filled 2014-03-13: qty 15

## 2014-03-13 NOTE — Telephone Encounter (Signed)
Patient has already had line replaced today.  See phone note from this am .  Lida at [facility] notified

## 2014-03-13 NOTE — Procedures (Signed)
Successful LUE SL POWER PICC EXCHG TIP SVC/Ra No comp Stable Ready for use

## 2014-03-13 NOTE — Telephone Encounter (Signed)
Patient with Chills when she is receiving her TPN Wednesday and Friday infusions.  She has held TPN Saturday and Sunday.  She does not have fever.  Current PICC line was changed last 09/2012.  Discussed with Dr. Fuller Plan she will go today for PICC exchange to [facility] radiology and PICC line tip cultured.  She verbalized understanding.

## 2014-03-17 LAB — CATH TIP CULTURE: Culture: >100

## 2014-03-20 ENCOUNTER — Other Ambulatory Visit: Payer: Self-pay

## 2014-03-20 DIAGNOSIS — R6883 Chills (without fever): Secondary | ICD-10-CM

## 2014-03-20 DIAGNOSIS — T80219A Unspecified infection due to central venous catheter, initial encounter: Secondary | ICD-10-CM

## 2014-03-20 NOTE — Progress Notes (Signed)
Quick Note:  The tip had a bacteria grow It is sensitive to cipro which she takes regularly (cyclical) When did she last take that How does she feel? Any fevers? ______

## 2014-03-20 NOTE — Progress Notes (Signed)
Quick Note:  I spoke to ID She needs to get 2 sets of blood cultures drawn if + we will need to treat ______

## 2014-03-21 ENCOUNTER — Other Ambulatory Visit: Payer: Self-pay | Admitting: Internal Medicine

## 2014-03-21 ENCOUNTER — Other Ambulatory Visit (INDEPENDENT_AMBULATORY_CARE_PROVIDER_SITE_OTHER): Payer: BC Managed Care – PPO

## 2014-03-21 ENCOUNTER — Other Ambulatory Visit: Payer: Self-pay

## 2014-03-21 DIAGNOSIS — T80219A Unspecified infection due to central venous catheter, initial encounter: Secondary | ICD-10-CM

## 2014-03-21 DIAGNOSIS — E639 Nutritional deficiency, unspecified: Secondary | ICD-10-CM

## 2014-03-21 LAB — PHOSPHORUS: PHOSPHORUS: 3 mg/dL (ref 2.3–4.6)

## 2014-03-21 LAB — MAGNESIUM: Magnesium: 2.2 mg/dL (ref 1.5–2.5)

## 2014-03-21 MED ORDER — CLOBETASOL PROPIONATE 0.05 % EX OINT: 1.0000 "application " | TOPICAL_OINTMENT | Freq: Two times a day (BID) | CUTANEOUS | Status: DC

## 2014-03-21 NOTE — Progress Notes (Signed)
Quick Note:  Ok to refill ointment ______

## 2014-03-26 ENCOUNTER — Telehealth: Payer: Self-pay | Admitting: Gastroenterology

## 2014-03-26 NOTE — Telephone Encounter (Signed)
I was called about the positive blood culture revealing gram negative rods.  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  I presume that this is the same organism.  It is sensitive to cipro and instructed her to refill her prescription.

## 2014-03-27 ENCOUNTER — Telehealth: Payer: Self-pay | Admitting: Internal Medicine

## 2014-03-27 LAB — CULTURE, BLOOD (SINGLE): Organism ID, Bacteria: NO GROWTH

## 2014-03-27 NOTE — Telephone Encounter (Signed)
Patient was called by Dr. Benson Norway this weekend.  He told her to start on Cipro when he was called with the results of the blood cultures this weekend.  Dr. Benson Norway instructed her to start on Cipro.  Dr. Carlean Purl she states she doesn't feel bad, but does not feel well.  "Im a little run down".  Dr. Carlean Purl please advise

## 2014-03-27 NOTE — Telephone Encounter (Signed)
I am aware and want her to continue cipro and i will check with ID also

## 2014-03-27 NOTE — Telephone Encounter (Signed)
Patient notified

## 2014-03-28 LAB — CULTURE, BLOOD (SINGLE)

## 2014-03-29 ENCOUNTER — Other Ambulatory Visit: Payer: Self-pay

## 2014-03-29 ENCOUNTER — Other Ambulatory Visit (INDEPENDENT_AMBULATORY_CARE_PROVIDER_SITE_OTHER): Payer: BC Managed Care – PPO

## 2014-03-29 DIAGNOSIS — T80219D Unspecified infection due to central venous catheter, subsequent encounter: Secondary | ICD-10-CM

## 2014-03-29 DIAGNOSIS — K912 Postsurgical malabsorption, not elsewhere classified: Secondary | ICD-10-CM

## 2014-03-29 LAB — CBC WITH DIFFERENTIAL/PLATELET
Basophils Absolute: 0 10*3/uL (ref 0.0–0.1)
Basophils Relative: 0.6 % (ref 0.0–3.0)
EOS ABS: 0.3 10*3/uL (ref 0.0–0.7)
Eosinophils Relative: 7.1 % — ABNORMAL HIGH (ref 0.0–5.0)
HCT: 32 % — ABNORMAL LOW (ref 36.0–46.0)
HEMOGLOBIN: 10.4 g/dL — AB (ref 12.0–15.0)
LYMPHS PCT: 44.4 % (ref 12.0–46.0)
Lymphs Abs: 2.1 10*3/uL (ref 0.7–4.0)
MCHC: 32.3 g/dL (ref 30.0–36.0)
MCV: 93.8 fl (ref 78.0–100.0)
MONOS PCT: 8.6 % (ref 3.0–12.0)
Monocytes Absolute: 0.4 10*3/uL (ref 0.1–1.0)
NEUTROS PCT: 39.3 % — AB (ref 43.0–77.0)
Neutro Abs: 1.9 10*3/uL (ref 1.4–7.7)
Platelets: 141 10*3/uL — ABNORMAL LOW (ref 150.0–400.0)
RBC: 3.42 Mil/uL — ABNORMAL LOW (ref 3.87–5.11)
RDW: 16.3 % — AB (ref 11.5–15.5)
WBC: 4.8 10*3/uL (ref 4.0–10.5)

## 2014-03-29 LAB — COMPREHENSIVE METABOLIC PANEL
ALBUMIN: 3.4 g/dL — AB (ref 3.5–5.2)
ALK PHOS: 75 U/L (ref 39–117)
ALT: 18 U/L (ref 0–35)
AST: 16 U/L (ref 0–37)
BUN: 14 mg/dL (ref 6–23)
CO2: 23 meq/L (ref 19–32)
Calcium: 8.6 mg/dL (ref 8.4–10.5)
Chloride: 107 mEq/L (ref 96–112)
Creatinine, Ser: 1.1 mg/dL (ref 0.4–1.2)
GFR: 55.49 mL/min — AB (ref >60.00)
GLUCOSE: 109 mg/dL — AB (ref 70–99)
POTASSIUM: 3.6 meq/L (ref 3.5–5.1)
Sodium: 136 mEq/L (ref 135–145)
Total Bilirubin: 0.3 mg/dL (ref 0.2–1.2)
Total Protein: 7.7 g/dL (ref 6.0–8.3)

## 2014-03-29 LAB — MAGNESIUM: MAGNESIUM: 1.9 mg/dL (ref 1.5–2.5)

## 2014-03-29 LAB — C-REACTIVE PROTEIN: CRP: 0.7 mg/dL (ref 0.5–20.0)

## 2014-03-29 LAB — BILIRUBIN, DIRECT: Bilirubin, Direct: 0.1 mg/dL (ref 0.0–0.3)

## 2014-03-29 LAB — PHOSPHORUS: PHOSPHORUS: 3.7 mg/dL (ref 2.3–4.6)

## 2014-03-29 LAB — TRIGLYCERIDES: TRIGLYCERIDES: 156 mg/dL — AB (ref 0.0–149.0)

## 2014-03-29 LAB — PREALBUMIN: Prealbumin: 17.3 mg/dL (ref 17.0–34.0)

## 2014-03-29 NOTE — Progress Notes (Signed)
Quick Note:  Tell [name] [name] to stay on the cipro Am finding out for how long It kills the bacteria ______

## 2014-03-29 NOTE — Progress Notes (Signed)
Quick Note:  Please forward to TPN agency ______

## 2014-03-29 NOTE — Progress Notes (Signed)
Quick Note:  ID recommends she have 2 blood cxs again now If they stay negative can keep current PICC If + PICC needs to come out ASAP and she will need IV Abx x 3 days before replacing ______

## 2014-03-30 ENCOUNTER — Other Ambulatory Visit: Payer: BC Managed Care – PPO

## 2014-03-30 DIAGNOSIS — T80219D Unspecified infection due to central venous catheter, subsequent encounter: Secondary | ICD-10-CM

## 2014-03-30 NOTE — Progress Notes (Signed)
Quick Note:  Please forward to TPN agency ______

## 2014-04-04 NOTE — Progress Notes (Signed)
Quick Note:  Blood cxs negative - good news  She can stop Cipro after taking for total of 10 days ______

## 2014-04-06 LAB — CULTURE, BLOOD (SINGLE): Organism ID, Bacteria: NO GROWTH

## 2014-04-07 ENCOUNTER — Other Ambulatory Visit (HOSPITAL_BASED_OUTPATIENT_CLINIC_OR_DEPARTMENT_OTHER): Payer: BC Managed Care – PPO

## 2014-04-07 ENCOUNTER — Ambulatory Visit (HOSPITAL_BASED_OUTPATIENT_CLINIC_OR_DEPARTMENT_OTHER): Payer: BC Managed Care – PPO

## 2014-04-07 DIAGNOSIS — D638 Anemia in other chronic diseases classified elsewhere: Secondary | ICD-10-CM

## 2014-04-07 DIAGNOSIS — D693 Immune thrombocytopenic purpura: Secondary | ICD-10-CM

## 2014-04-07 LAB — CBC WITH DIFFERENTIAL/PLATELET
BASO%: 0.7 % (ref 0.0–2.0)
Basophils Absolute: 0 10*3/uL (ref 0.0–0.1)
EOS%: 5.9 % (ref 0.0–7.0)
Eosinophils Absolute: 0.4 10*3/uL (ref 0.0–0.5)
HCT: 31.8 % — ABNORMAL LOW (ref 34.8–46.6)
HGB: 9.9 g/dL — ABNORMAL LOW (ref 11.6–15.9)
LYMPH%: 27.2 % (ref 14.0–49.7)
MCH: 29.9 pg (ref 25.1–34.0)
MCHC: 31.2 g/dL — ABNORMAL LOW (ref 31.5–36.0)
MCV: 95.9 fL (ref 79.5–101.0)
MONO#: 0.7 10*3/uL (ref 0.1–0.9)
MONO%: 10.7 % (ref 0.0–14.0)
NEUT#: 3.5 10*3/uL (ref 1.5–6.5)
NEUT%: 55.5 % (ref 38.4–76.8)
PLATELETS: 84 10*3/uL — AB (ref 145–400)
RBC: 3.32 10*6/uL — ABNORMAL LOW (ref 3.70–5.45)
RDW: 15.6 % — AB (ref 11.2–14.5)
WBC: 6.2 10*3/uL (ref 3.9–10.3)
lymph#: 1.7 10*3/uL (ref 0.9–3.3)

## 2014-04-07 NOTE — Progress Notes (Signed)
Pt in for aranesp.  No orders in and signed for Aranesp.  Dr. Humphrey Rolls no longer with practice, pt reassigned Dr. Lona Kettle but has not seen md and this physician is no longer with practice.  RN asked Hilario Quarry RN about which physician to go to.  Per Hilario Quarry Rn, who suggested going to Dr. Burr Medico, doc. On call today.  RN asked Dr. Burr Medico about orders.  Since pt is borderline needing the shot, per Dr. Burr Medico, it will be held today and the patient will be assigned a new doctor with the practice and appointment will be made.  Pt has chronic disease, she did state she is more fatigued right now because she has been sick.  Pt made aware of situation, although not happy, pt verbalized understanding.  RN instructed pt to call office with any further complications or concerns. Pt and spouse sent home with copy of labs and told that someone will call with new appointments and physician's name.

## 2014-04-20 ENCOUNTER — Other Ambulatory Visit (INDEPENDENT_AMBULATORY_CARE_PROVIDER_SITE_OTHER): Payer: BC Managed Care – PPO

## 2014-04-20 DIAGNOSIS — K912 Postsurgical malabsorption, not elsewhere classified: Secondary | ICD-10-CM

## 2014-04-20 LAB — COMPREHENSIVE METABOLIC PANEL
ALBUMIN: 3.1 g/dL — AB (ref 3.5–5.2)
ALK PHOS: 73 U/L (ref 39–117)
ALT: 16 U/L (ref 0–35)
AST: 15 U/L (ref 0–37)
BUN: 24 mg/dL — AB (ref 6–23)
CO2: 23 mEq/L (ref 19–32)
Calcium: 8.2 mg/dL — ABNORMAL LOW (ref 8.4–10.5)
Chloride: 106 mEq/L (ref 96–112)
Creatinine, Ser: 0.9 mg/dL (ref 0.4–1.2)
GFR: 66.16 mL/min (ref >60.00)
Glucose, Bld: 129 mg/dL — ABNORMAL HIGH (ref 70–99)
POTASSIUM: 3.5 meq/L (ref 3.5–5.1)
SODIUM: 136 meq/L (ref 135–145)
TOTAL PROTEIN: 7.2 g/dL (ref 6.0–8.3)
Total Bilirubin: 0.4 mg/dL (ref 0.2–1.2)

## 2014-04-20 LAB — BILIRUBIN, DIRECT: BILIRUBIN DIRECT: 0.1 mg/dL (ref 0.0–0.3)

## 2014-04-20 LAB — CBC WITH DIFFERENTIAL/PLATELET
BASOS ABS: 0 10*3/uL (ref 0.0–0.1)
BASOS PCT: 0.3 % (ref 0.0–3.0)
Eosinophils Absolute: 0.3 10*3/uL (ref 0.0–0.7)
Eosinophils Relative: 3.2 % (ref 0.0–5.0)
HCT: 29.7 % — ABNORMAL LOW (ref 36.0–46.0)
HEMOGLOBIN: 9.7 g/dL — AB (ref 12.0–15.0)
LYMPHS ABS: 1.8 10*3/uL (ref 0.7–4.0)
LYMPHS PCT: 22.8 % (ref 12.0–46.0)
MCHC: 32.6 g/dL (ref 30.0–36.0)
MCV: 93.2 fl (ref 78.0–100.0)
MONOS PCT: 6.9 % (ref 3.0–12.0)
Monocytes Absolute: 0.6 10*3/uL (ref 0.1–1.0)
NEUTROS ABS: 5.4 10*3/uL (ref 1.4–7.7)
Neutrophils Relative %: 66.8 % (ref 43.0–77.0)
Platelets: 100 10*3/uL — ABNORMAL LOW (ref 150.0–400.0)
RBC: 3.19 Mil/uL — AB (ref 3.87–5.11)
RDW: 14.6 % (ref 11.5–15.5)
WBC: 8 10*3/uL (ref 4.0–10.5)

## 2014-04-20 LAB — MAGNESIUM: Magnesium: 1.8 mg/dL (ref 1.5–2.5)

## 2014-04-20 LAB — TRIGLYCERIDES: Triglycerides: 65 mg/dL (ref 0.0–149.0)

## 2014-04-20 LAB — C-REACTIVE PROTEIN: CRP: 1.4 mg/dL (ref 0.5–20.0)

## 2014-04-20 LAB — PREALBUMIN: PREALBUMIN: 15.1 mg/dL — AB (ref 17.0–34.0)

## 2014-04-20 LAB — PHOSPHORUS: PHOSPHORUS: 2.9 mg/dL (ref 2.3–4.6)

## 2014-04-20 NOTE — Progress Notes (Signed)
Quick Note:  Please forward ______

## 2014-04-25 NOTE — Progress Notes (Signed)
Patient ID: [name] [name], female   DOB: 09-19-1950, 63 y.o.   MRN: [id number] [name] [name] is a 63 y.o. 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 She is followed here by Dr. Carlean Purl and at [facility] with Dr. Clementeen Graham (has been placed on Gattex). She established with Dr. Jenkins Rouge during admission for acute systolic CHF in 0/2725. Nuclear study did demonstrate scar with minimal peri-infarct ischemia. Cardiac catheterization was deferred secondary to acute renal failure and recent admission for fungemia. Last seen by me 02/2012.  Seen by PA 9/15 and myovue ordered   9/15:  Overall Impression: Low risk stress nuclear study . The patient has had a previous inferoapical MI. There is no ischemia.  LV Ejection Fraction: 47%. LV Wall Motion: There is akinesis of the apex and mid/apical inferior wall. EF improved compared to 8/13    Her PCP recently arranged a follow up echocardiogram. This demonstrated improved LV function with an EF of 45%. The patient is interested in coming off of some of her medications. She arranged follow up here today. She has already stopped taking carvedilol. She stopped this several months ago. She would like to come off of isosorbide. She denies chest pain, shortness of breath, syncope, orthopnea, PND or edema. She is NYHA class II.   Studies: - Echo (12/2011): EF 30% - Echo (09/26/13): Inferior and apical HK, EF 45%, mild MR - Nuclear (12/26/11): Inferior/inferolateral/ateroseptal/apical scar with minimal peri-infarct ischemia, EF 33%.  - Carotid US (9/15): R 60-79%      ROS: Denies fever, malais, weight loss, blurry vision, decreased visual acuity, cough, sputum,  SOB, hemoptysis, pleuritic pain, palpitaitons, heartburn, abdominal pain, melena, lower extremity edema, claudication, or rash.  All other systems reviewed and negative  General: Affect appropriate Healthy:  appears stated age 83: normal Neck supple with no adenopathy JVP normal no bruits no thyromegaly Lungs clear with no wheezing and good diaphragmatic motion Heart:  S1/S2 no murmur, no rub, gallop or click PMI normal Abdomen: benighn, BS positve, no tenderness, no AAA no bruit.  No HSM or HJR Distal pulses intact with no bruits No edema Neuro non-focal Skin warm and dry No muscular weakness   Current Outpatient Prescriptions  Medication Sig Dispense Refill  . ADULT TPN Inject 2,100 mLs into the vein continuous. Infuse 2179ml over 10 hours with 1 hour ramp up and 1 hour ramp down. Total bag per day provides: 250g Dextrose 70%, 100g Travasol 10%, 45g Intralipids 30%, various electrolytes, MVI, TE, Zinc, selenium.    Marland Kitchen aspirin EC 81 MG tablet Take 1 tablet (81 mg total) by mouth daily.    . ciprofloxacin (CIPRO) 500 MG tablet Take 1 tablet (500 mg total) by mouth 2 (two) times daily. 28 tablet 5  . clobetasol ointment (TEMOVATE) 3.66 % Apply 1 application topically 2 (two) times daily. 30 g 0  . diphenhydrAMINE (BENADRYL) 25 mg capsule Take 25 mg by mouth every 6 (six) hours as needed. For itching/sleep.    . diphenoxylate-atropine (LOMOTIL) 2.5-0.025 MG per tablet Take 2 tablets by mouth 1/2 hour before meals. 180 tablet 1  . loperamide (IMODIUM A-D) 2 MG tablet Take 2 tablets 30 minutes prior to each meal 180 tablet 6  . Multiple Vitamin (MULTIVITAMIN WITH  MINERALS) TABS Take 1 tablet by mouth daily.    . mupirocin ointment (BACTROBAN) 2 % Apply 1 application topically 2 (two) times daily. Apply to lt forearm area bid 22 g 0  . omeprazole (PRILOSEC) 40 MG capsule TAKE ONE CAPSULE BY MOUTH TWICE DAILY AT 10 AM AND 5 PM 60 capsule 5  . ondansetron (ZOFRAN) 4 MG tablet Take 1 tablet  (4 mg total) by mouth every 8 (eight) hours as needed for nausea or vomiting. 60 tablet 1  . pravastatin (PRAVACHOL) 20 MG tablet Take 1 tablet (20 mg total) by mouth every evening. 30 tablet 11  . Teduglutide, rDNA, (GATTEX) 5 MG KIT Inject 3.3 Units into the skin daily. 1 kit 5   No current facility-administered medications for this visit.    Allergies  Iohexol and Penicillins  Electrocardiogram:  7/15  SR rate 64  PR 222  Inferolateral T wave changes ? Old IMI   Assessment and Plan

## 2014-04-26 ENCOUNTER — Encounter: Payer: BC Managed Care – PPO | Admitting: Cardiovascular Disease

## 2014-04-27 ENCOUNTER — Telehealth: Payer: Self-pay | Admitting: Internal Medicine

## 2014-04-27 ENCOUNTER — Telehealth: Payer: Self-pay | Admitting: Hematology and Oncology

## 2014-04-27 NOTE — Telephone Encounter (Signed)
I spoke with Tiffany at the [facility] after speaking with Yeslin. Tiffany will call me back once she sets up an appt

## 2014-04-27 NOTE — Telephone Encounter (Signed)
, °

## 2014-04-28 NOTE — Telephone Encounter (Signed)
Patient is scheduled for hematology 12/22

## 2014-05-02 ENCOUNTER — Ambulatory Visit (HOSPITAL_BASED_OUTPATIENT_CLINIC_OR_DEPARTMENT_OTHER): Payer: BC Managed Care – PPO

## 2014-05-02 ENCOUNTER — Ambulatory Visit (HOSPITAL_BASED_OUTPATIENT_CLINIC_OR_DEPARTMENT_OTHER): Payer: BC Managed Care – PPO | Admitting: Hematology and Oncology

## 2014-05-02 ENCOUNTER — Other Ambulatory Visit: Payer: Self-pay | Admitting: *Deleted

## 2014-05-02 ENCOUNTER — Telehealth: Payer: Self-pay | Admitting: Hematology and Oncology

## 2014-05-02 VITALS — BP 122/59 | HR 61 | Temp 98.2°F | Resp 18 | Ht 67.0 in | Wt 144.5 lb

## 2014-05-02 DIAGNOSIS — D638 Anemia in other chronic diseases classified elsewhere: Secondary | ICD-10-CM

## 2014-05-02 DIAGNOSIS — D696 Thrombocytopenia, unspecified: Secondary | ICD-10-CM

## 2014-05-02 LAB — CBC WITH DIFFERENTIAL/PLATELET
BASO%: 0.3 % (ref 0.0–2.0)
Basophils Absolute: 0 10*3/uL (ref 0.0–0.1)
EOS ABS: 0.2 10*3/uL (ref 0.0–0.5)
EOS%: 3.2 % (ref 0.0–7.0)
HEMATOCRIT: 30.8 % — AB (ref 34.8–46.6)
HEMOGLOBIN: 9.9 g/dL — AB (ref 11.6–15.9)
LYMPH#: 1.9 10*3/uL (ref 0.9–3.3)
LYMPH%: 27.2 % (ref 14.0–49.7)
MCH: 30.9 pg (ref 25.1–34.0)
MCHC: 32.1 g/dL (ref 31.5–36.0)
MCV: 96.3 fL (ref 79.5–101.0)
MONO#: 0.5 10*3/uL (ref 0.1–0.9)
MONO%: 6.8 % (ref 0.0–14.0)
NEUT%: 62.5 % (ref 38.4–76.8)
NEUTROS ABS: 4.4 10*3/uL (ref 1.5–6.5)
Platelets: 102 10*3/uL — ABNORMAL LOW (ref 145–400)
RBC: 3.2 10*6/uL — ABNORMAL LOW (ref 3.70–5.45)
RDW: 14.2 % (ref 11.2–14.5)
WBC: 7 10*3/uL (ref 3.9–10.3)

## 2014-05-02 MED ORDER — DARBEPOETIN ALFA 300 MCG/0.6ML IJ SOSY
300.0000 ug | PREFILLED_SYRINGE | Freq: Once | INTRAMUSCULAR | Status: AC
  Administered 2014-05-02: 300 ug via SUBCUTANEOUS
  Filled 2014-05-02: qty 0.6

## 2014-05-02 NOTE — Progress Notes (Signed)
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 Most recently she has not required as often. But for the past couple of months she has been feeling more more tired. She is now on a new medication called Gattex which is apparently a growth hormone that she injects into the stomach every day. This has been helping her with her short gut syndrome. She is slightly more fatigued related to the anemia.  REVIEW OF SYSTEMS:   Constitutional: Denies fevers, chills or abnormal weight loss Eyes: Denies blurriness of vision Ears, nose, mouth, throat, and face: Denies mucositis or sore throat Respiratory: Denies cough, dyspnea or wheezes Cardiovascular: Denies palpitation, chest discomfort or lower extremity swelling Gastrointestinal:  Denies nausea, heartburn or change in bowel habits Skin: Denies abnormal skin rashes Lymphatics: Denies new lymphadenopathy or easy bruising Neurological:Denies numbness, tingling or new weaknesses Behavioral/Psych: Mood is stable, no new changes  All other systems were reviewed with the patient and are negative.  I have reviewed the past medical history, past surgical history, social history and family history with the patient and they are unchanged from previous note.  ALLERGIES:  is allergic to iohexol and penicillins.  MEDICATIONS:  Current Outpatient Prescriptions  Medication Sig Dispense Refill  . ADULT TPN Inject 2,100 mLs into the vein continuous. Infuse 2163m over 10 hours with 1 hour ramp up and 1 hour ramp down. Total bag per day provides: 250g  Dextrose 70%, 100g Travasol 10%, 45g Intralipids 30%, various electrolytes, MVI, TE, Zinc, selenium.    .Marland Kitchenaspirin EC 81 MG tablet Take 1 tablet (81 mg total) by mouth daily.    . ciprofloxacin (CIPRO) 500 MG tablet Take 1 tablet (500 mg total) by mouth 2 (two) times daily. 28 tablet 5  . clobetasol ointment (TEMOVATE) 04.49% Apply 1 application topically 2 (two) times daily. 30 g 0  . diphenhydrAMINE (BENADRYL) 25 mg capsule Take 25 mg by mouth every 6 (six) hours as needed. For itching/sleep.    . diphenoxylate-atropine (LOMOTIL) 2.5-0.025 MG per tablet Take 2 tablets by mouth 1/2 hour before meals. 180 tablet 1  . loperamide (IMODIUM A-D) 2 MG tablet Take 2 tablets 30 minutes prior to each meal 180 tablet 6  . Multiple Vitamin (MULTIVITAMIN WITH MINERALS) TABS Take 1 tablet by mouth daily.    . mupirocin ointment (BACTROBAN) 2 % Apply 1 application topically 2 (two) times daily. Apply to lt forearm area bid 22 g 0  . omeprazole (PRILOSEC) 40 MG capsule TAKE ONE CAPSULE BY MOUTH TWICE DAILY AT 10 AM AND 5 PM 60 capsule 5  . ondansetron (ZOFRAN) 4 MG tablet Take 1 tablet (4 mg total) by mouth every 8 (eight) hours as needed for nausea or vomiting. 60 tablet 1  . pravastatin (PRAVACHOL) 20 MG tablet Take 1 tablet (20 mg total) by mouth every evening. 30 tablet 11  . Teduglutide, rDNA, (GATTEX) 5 MG KIT Inject 3.3 Units into the skin daily. 1 kit 5   No current facility-administered medications for this visit.    PHYSICAL EXAMINATION: ECOG PERFORMANCE STATUS: 1 - Symptomatic but completely ambulatory  Filed Vitals:   05/02/14 1112  BP: 122/59  Pulse: 61  Temp: 98.2 F (36.8 C)  Resp: 18   Filed Weights   05/02/14 1112  Weight: 144 lb 8 oz (65.545 kg)    GENERAL:alert, no distress and comfortable SKIN: skin color, texture, turgor are normal, no rashes or significant lesions EYES: normal, Conjunctiva are pink and non-injected, sclera clear OROPHARYNX:no exudate, no erythema and lips,  buccal mucosa, and tongue normal  NECK: supple, thyroid normal size, non-tender, without nodularity LYMPH:  no palpable lymphadenopathy in the cervical, axillary or inguinal LUNGS: clear to auscultation and percussion with normal breathing effort HEART: regular rate & rhythm and no murmurs and no lower extremity edema ABDOMEN:abdomen soft, non-tender and normal bowel sounds Musculoskeletal:no cyanosis of digits and no clubbing  NEURO: alert & oriented x 3 with fluent speech, no focal motor/sensory deficits  LABORATORY DATA:  I have reviewed the data as listed   Chemistry      Component Value Date/Time   NA 136 04/20/2014 1035   NA 139 01/13/2014 0951   NA 140 03/01/2009 1029   K 3.5 04/20/2014 1035   K 3.6 01/13/2014 0951   K 4.2 03/01/2009 1029   CL 106 04/20/2014 1035   CL 106 10/22/2012 1005   CL 104 03/01/2009 1029   CO2 23 04/20/2014 1035   CO2 21* 01/13/2014 0951   CO2 25 03/01/2009 1029   BUN 24* 04/20/2014 1035   BUN 14.6 01/13/2014 0951   BUN 17 03/01/2009 1029   CREATININE 0.9 04/20/2014 1035   CREATININE 1.0 01/13/2014 0951   CREATININE 0.5* 03/01/2009 1029      Component Value Date/Time   CALCIUM 8.2* 04/20/2014 1035   CALCIUM 8.9 01/13/2014 0951   CALCIUM 8.7 03/01/2009 1029   ALKPHOS 73 04/20/2014 1035   ALKPHOS 85 01/13/2014 0951   ALKPHOS 143* 03/01/2009 1029   AST 15 04/20/2014 1035   AST 19 01/13/2014 0951   AST 46* 03/01/2009 1029   ALT 16 04/20/2014 1035   ALT 20 01/13/2014 0951   ALT 49* 03/01/2009 1029   BILITOT 0.4 04/20/2014 1035   BILITOT 0.26 01/13/2014 0951   BILITOT 0.40 03/01/2009 1029       Lab Results  Component Value Date   WBC 8.0 04/20/2014   HGB 9.7* 04/20/2014   HCT 29.7* 04/20/2014   MCV 93.2 04/20/2014   PLT 100.0* 04/20/2014   NEUTROABS 5.4 04/20/2014    ASSESSMENT & PLAN:  Anemia of chronic disease Anemia and thrombocytopenia: Normocytic anemia due to chronic disease: Patient has been on Aranesp injections for  quite some time but lately has not received them. Started July 2012, last injection was August 2015 and prior to that was in March 2015. Hemoglobin 2 weeks ago was 9.7 and the patient is symptomatic hence I recommended resuming Aranesp treatments starting today. I recommended giving her 1 injection every 2 months. This interval may be increased as deemed appropriate based on her blood counts.  Thrombocytopenia unclear etiology: Dr. 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 I discussed with her that a bone marrow biopsy would be extremely informative to understand cause and for cytopenias. We can also obtain cytogenetics simultaneously. But patient refused to undergo the bone marrow biopsy. I discussed with her that the degree of thrombocytopenia is not severe and it does not need to be treated.  We will need to follow her in 2 months to check up on her blood counts and for added Aranesp injections. Short-bowel syndrome: Patient is currently on Gattex,  Teduglutide she is currently on TPN 3 nights a week. She had surgery on her small intestine for blood clots in 2006 and since then she has short gut syndrome. This is the reason for her chronic disease.  No orders of the defined types were placed in this encounter.   The patient has a good understanding of the overall plan. she agrees with it. She will call with any problems that may develop before her next visit here.   Rulon Eisenmenger, MD 05/02/2014 11:44 AM

## 2014-05-02 NOTE — Telephone Encounter (Signed)
Per 12/22 POF, scheduled inj/MD visit advised pt to get updated sch when she comes back this afternoon for shot/labs..... KJ

## 2014-05-02 NOTE — Assessment & Plan Note (Addendum)
Anemia and thrombocytopenia: Normocytic anemia due to chronic disease: Patient has been on Aranesp injections for quite some time but lately has not received them. Started July 2012, last injection was August 2015 and prior to that was in March 2015. Hemoglobin 2 weeks ago was 9.7 and the patient is symptomatic hence I recommended resuming Aranesp treatments starting today. I recommended giving her 1 injection every 2 months. This interval may be increased as deemed appropriate based on her blood counts.  Thrombocytopenia unclear etiology: Dr. 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 I discussed with her that a bone marrow biopsy would be extremely informative to understand cause and for cytopenias. We can also obtain cytogenetics simultaneously. But patient refused to undergo the bone marrow biopsy. I discussed with her that the degree of thrombocytopenia is not severe and it does not need to be treated.  We will need to follow her in 2 months to check up on her blood counts and for added Aranesp injections.

## 2014-05-02 NOTE — Patient Instructions (Signed)
Darbepoetin Alfa injection What is this medicine? DARBEPOETIN ALFA (dar be POE e tin AL fa) helps your body make more red blood cells. It is used to treat anemia caused by chronic kidney failure and chemotherapy. This medicine may be used for other purposes; ask your health care provider or pharmacist if you have questions. COMMON BRAND NAME(S): Aranesp What should I tell my health care provider before I take this medicine? They need to know if you have any of these conditions: -blood clotting disorders or history of blood clots -cancer patient not on chemotherapy -cystic fibrosis -heart disease, such as angina, heart failure, or a history of a heart attack -hemoglobin level of 12 g/dL or greater -high blood pressure -low levels of folate, iron, or vitamin B12 -seizures -an unusual or allergic reaction to darbepoetin, erythropoietin, albumin, hamster proteins, latex, other medicines, foods, dyes, or preservatives -pregnant or trying to get pregnant -breast-feeding How should I use this medicine? This medicine is for injection into a vein or under the skin. It is usually given by a health care professional in a hospital or clinic setting. If you get this medicine at home, you will be taught how to prepare and give this medicine. Do not shake the solution before you withdraw a dose. Use exactly as directed. Take your medicine at regular intervals. Do not take your medicine more often than directed. It is important that you put your used needles and syringes in a special sharps container. Do not put them in a trash can. If you do not have a sharps container, call your pharmacist or healthcare provider to get one. Talk to your pediatrician regarding the use of this medicine in children. While this medicine may be used in children as young as 1 year for selected conditions, precautions do apply. Overdosage: If you think you have taken too much of this medicine contact a poison control center or  emergency room at once. NOTE: This medicine is only for you. Do not share this medicine with others. What if I miss a dose? If you miss a dose, take it as soon as you can. If it is almost time for your next dose, take only that dose. Do not take double or extra doses. What may interact with this medicine? Do not take this medicine with any of the following medications: -epoetin alfa This list may not describe all possible interactions. Give your health care provider a list of all the medicines, herbs, non-prescription drugs, or dietary supplements you use. Also tell them if you smoke, drink alcohol, or use illegal drugs. Some items may interact with your medicine. What should I watch for while using this medicine? Visit your prescriber or health care professional for regular checks on your progress and for the needed blood tests and blood pressure measurements. It is especially important for the doctor to make sure your hemoglobin level is in the desired range, to limit the risk of potential side effects and to give you the best benefit. Keep all appointments for any recommended tests. Check your blood pressure as directed. Ask your doctor what your blood pressure should be and when you should contact him or her. As your body makes more red blood cells, you may need to take iron, folic acid, or vitamin B supplements. Ask your doctor or health care provider which products are right for you. If you have kidney disease continue dietary restrictions, even though this medication can make you feel better. Talk with your doctor or health   care professional about the foods you eat and the vitamins that you take. What side effects may I notice from receiving this medicine? Side effects that you should report to your doctor or health care professional as soon as possible: -allergic reactions like skin rash, itching or hives, swelling of the face, lips, or tongue -breathing problems -changes in vision -chest  pain -confusion, trouble speaking or understanding -feeling faint or lightheaded, falls -high blood pressure -muscle aches or pains -pain, swelling, warmth in the leg -rapid weight gain -severe headaches -sudden numbness or weakness of the face, arm or leg -trouble walking, dizziness, loss of balance or coordination -seizures (convulsions) -swelling of the ankles, feet, hands -unusually weak or tired Side effects that usually do not require medical attention (report to your doctor or health care professional if they continue or are bothersome): -diarrhea -fever, chills (flu-like symptoms) -headaches -nausea, vomiting -redness, stinging, or swelling at site where injected This list may not describe all possible side effects. Call your doctor for medical advice about side effects. You may report side effects to FDA at 1-800-FDA-1088. Where should I keep my medicine? Keep out of the reach of children. Store in a refrigerator between 2 and 8 degrees C (36 and 46 degrees F). Do not freeze. Do not shake. Throw away any unused portion if using a single-dose vial. Throw away any unused medicine after the expiration date. NOTE: This sheet is a summary. It may not cover all possible information. If you have questions about this medicine, talk to your doctor, pharmacist, or health care provider.  2015, Elsevier/Gold Standard. (2008-04-11 10:23:57)  

## 2014-05-15 ENCOUNTER — Other Ambulatory Visit (INDEPENDENT_AMBULATORY_CARE_PROVIDER_SITE_OTHER): Payer: Self-pay

## 2014-05-15 DIAGNOSIS — K912 Postsurgical malabsorption, not elsewhere classified: Secondary | ICD-10-CM

## 2014-05-15 LAB — CBC WITH DIFFERENTIAL/PLATELET
BASOS ABS: 0 10*3/uL (ref 0.0–0.1)
Basophils Relative: 0.8 % (ref 0.0–3.0)
Eosinophils Absolute: 0.2 10*3/uL (ref 0.0–0.7)
Eosinophils Relative: 4.5 % (ref 0.0–5.0)
HCT: 33.9 % — ABNORMAL LOW (ref 36.0–46.0)
Hemoglobin: 10.9 g/dL — ABNORMAL LOW (ref 12.0–15.0)
Lymphocytes Relative: 42.9 % (ref 12.0–46.0)
Lymphs Abs: 1.9 10*3/uL (ref 0.7–4.0)
MCHC: 32.2 g/dL (ref 30.0–36.0)
MCV: 97.1 fl (ref 78.0–100.0)
MONO ABS: 0.3 10*3/uL (ref 0.1–1.0)
Monocytes Relative: 6.9 % (ref 3.0–12.0)
Neutro Abs: 2 10*3/uL (ref 1.4–7.7)
Neutrophils Relative %: 44.9 % (ref 43.0–77.0)
PLATELETS: 101 10*3/uL — AB (ref 150.0–400.0)
RBC: 3.49 Mil/uL — ABNORMAL LOW (ref 3.87–5.11)
RDW: 15.4 % (ref 11.5–15.5)
WBC: 4.4 10*3/uL (ref 4.0–10.5)

## 2014-05-15 LAB — TRIGLYCERIDES: TRIGLYCERIDES: 180 mg/dL — AB (ref 0.0–149.0)

## 2014-05-15 LAB — COMPREHENSIVE METABOLIC PANEL
ALBUMIN: 3.1 g/dL — AB (ref 3.5–5.2)
ALT: 17 U/L (ref 0–35)
AST: 14 U/L (ref 0–37)
Alkaline Phosphatase: 69 U/L (ref 39–117)
BUN: 14 mg/dL (ref 6–23)
CO2: 25 mEq/L (ref 19–32)
Calcium: 8.5 mg/dL (ref 8.4–10.5)
Chloride: 110 mEq/L (ref 96–112)
Creatinine, Ser: 0.9 mg/dL (ref 0.4–1.2)
GFR: 66.99 mL/min (ref >60.00)
Glucose, Bld: 80 mg/dL (ref 70–99)
POTASSIUM: 3.6 meq/L (ref 3.5–5.1)
Sodium: 140 mEq/L (ref 135–145)
Total Bilirubin: 0.5 mg/dL (ref 0.2–1.2)
Total Protein: 7.2 g/dL (ref 6.0–8.3)

## 2014-05-15 LAB — PREALBUMIN: Prealbumin: 14.1 mg/dL — ABNORMAL LOW (ref 17.0–34.0)

## 2014-05-15 LAB — MAGNESIUM: MAGNESIUM: 1.7 mg/dL (ref 1.5–2.5)

## 2014-05-15 LAB — PHOSPHORUS: Phosphorus: 3.9 mg/dL (ref 2.3–4.6)

## 2014-05-15 LAB — BILIRUBIN, DIRECT: Bilirubin, Direct: 0.1 mg/dL (ref 0.0–0.3)

## 2014-05-15 LAB — C-REACTIVE PROTEIN: CRP: 2 mg/dL (ref 0.5–20.0)

## 2014-05-15 NOTE — Progress Notes (Signed)
Patient ID: [name] [name], female   DOB: 07/27/1950, 64 y.o.   MRN: [id number]   Cardiology Office Note    Date:  05/16/2014   ID:  [name] [name], DOB 10-09-1950, MRN [id number]  PCP:  [name] Asa, MD  Cardiologist:  [name] [name]      History of Present Illness: [name] [name] is a 64 y.o. 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  She is followed here by [name] [name] and at [facility] with [name] [name] (has been placed on Gattex). I have not seen her since 2013  Seen by PA 7/15 With Dr Ron [name].    Echo 5/15  Improved EF Study Conclusions  - Left ventricle: Inferior and apical hypokinesis. The cavity size was mildly dilated. Wall thickness was normal. The estimated ejection fraction was 45%. - Aortic valve: Valve area (VTI): 2.28 cm^2. Valve area (Vmax): 2.4 cm^2. - Mitral valve: There was mild regurgitation. - Atrial septum: No defect or patent foramen ovale was identified.  ------------------------------------------------------------------- Labs, prior tests, procedures, and surgery: Echocardiography (August 2013).   EF was 30%.  Myovue 9/15 with old inferior apical scar no ischemia  EF  47%   No further w/u deemed necessary by PA at time    Her imdur and coreg had been stopped  The former not thought to be doing any good and latter as she had first degree block and resting HR in 60's   Carotids updated 9/15 40-59% RICA stenosis.  F/U duplex in 1 year.  She feels great TPN 3x/week and iv fluid 1x/week Has been off anticoagulation for 2 years and it is not clear to me why      Recent Labs: 01/09/2014: HDL Cholesterol by NMR 19.40*; LDL (calc) 25 05/15/2014: ALT 17; Creatinine  0.9; Hemoglobin 10.9*; Potassium 3.6  Wt Readings from Last 3 Encounters:  05/16/14 65.772 kg (145 lb)  05/02/14 65.545 kg (144 lb 8 oz)  01/24/14 65.772 kg (145 lb)     Past Medical History  Diagnosis Date  . 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 Abnormal LFTs   . Vitamin D deficiency   . Personal history of colonic polyps   . Thrombophilia   . Diabetes mellitus     resolved after weight loss  . Obesity     prior to weight loss  . Allergic rhinosinusitis   . At risk for dental problems   . Fracture of left clavicle   . Osteoporosis   . Anemia of chronic disease   . Renal insufficiency   . Atypical nevus   . Pathologic fracture of neck of femur   . Pancytopenia 10/07/2011  . Small bowel ischemia   . Bacterial overgrowth syndrome   . Splenomegaly     By ultrasound  . 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 Carotid stenosis     Carotid US (9/15):  R 40-59%; L 1-39% >> FU 1 year  . Hx of cardiovascular stress test     Myoview (9/15):  inf-apical scar; no ischemia; EF 47% - low risk     Current Outpatient Prescriptions  Medication Sig Dispense Refill  . ADULT TPN Inject 2,100 mLs into the vein continuous. Infuse 215m over  10 hours with 1 hour ramp up and 1 hour ramp down. Total bag per day provides: 250g Dextrose 70%, 100g Travasol 10%, 45g Intralipids 30%, various electrolytes, MVI, TE, Zinc, selenium.    Marland Kitchen aspirin EC 81 MG tablet Take 1 tablet (81 mg total) by mouth daily.    . ciprofloxacin (CIPRO) 500 MG tablet Take 1 tablet (500 mg total) by mouth 2 (two) times daily. 28 tablet 5  . clobetasol ointment (TEMOVATE) 8.18 % Apply 1 application topically 2 (two) times daily. 30 g 0  . diphenhydrAMINE (BENADRYL) 25 mg capsule Take 25 mg by mouth every 6 (six) hours as needed. For itching/sleep.    . diphenoxylate-atropine (LOMOTIL) 2.5-0.025 MG per tablet Take 2 tablets by mouth 1/2 hour before meals. 180 tablet 1  . loperamide (IMODIUM A-D) 2 MG tablet  Take 2 tablets 30 minutes prior to each meal 180 tablet 6  . Multiple Vitamin (MULTIVITAMIN WITH MINERALS) TABS Take 1 tablet by mouth daily.    . mupirocin ointment (BACTROBAN) 2 % Apply 1 application topically 2 (two) times daily. Apply to lt forearm area bid 22 g 0  . omeprazole (PRILOSEC) 40 MG capsule TAKE ONE CAPSULE BY MOUTH TWICE DAILY AT 10 AM AND 5 PM 60 capsule 5  . ondansetron (ZOFRAN) 4 MG tablet Take 1 tablet (4 mg total) by mouth every 8 (eight) hours as needed for nausea or vomiting. 60 tablet 1  . pravastatin (PRAVACHOL) 20 MG tablet Take 1 tablet (20 mg total) by mouth every evening. 30 tablet 11  . Teduglutide, rDNA, (GATTEX) 5 MG KIT Inject 3.3 Units into the skin daily. 1 kit 5   No current facility-administered medications for this visit.    Allergies:   Iohexol and Penicillins   Social History:  The patient  reports that she quit smoking about 10 years ago. Her smoking use included Cigarettes. She smoked 0.00 packs per day. She has never used smokeless tobacco. She reports that she does not drink alcohol or use illicit drugs.   Family History:  The patient's family history includes Diabetes in her mother; Hypertension in her mother. There is no history of Colon cancer or Stomach cancer.   ROS:  Please see the history of present illness.   She has chronic diarrhea.   All other systems reviewed and negative.   PHYSICAL EXAM: VS:  BP 124/74 mmHg  Pulse 51  Ht _0  (1.702 m)  Wt 65.772 kg (145 lb)  BMI 22.71 kg/m2  SpO2 99% Well nourished, well developed, in no acute distress HEENT: normal Neck: no JVD Cardiac:  normal S1, S2; RRR; no murmur Lungs:  clear to auscultation bilaterally, no wheezing, rhonchi or rales Abd: soft, nontender, no hepatomegaly Ext: no edema Skin: warm and dry Neuro:  CNs 2-12 intact, no focal abnormalities noted  EKG:  NSR, HR 64, normal axis, IVCD, poor R wave progression, anterolateral T wave inversions, first degree block with a PR  interval of 222

## 2014-05-16 ENCOUNTER — Ambulatory Visit (INDEPENDENT_AMBULATORY_CARE_PROVIDER_SITE_OTHER): Payer: BLUE CROSS/BLUE SHIELD | Admitting: Cardiovascular Disease

## 2014-05-16 ENCOUNTER — Encounter: Payer: Self-pay | Admitting: Cardiovascular Disease

## 2014-05-16 VITALS — BP 124/74 | HR 51 | Ht 67.0 in | Wt 145.0 lb

## 2014-05-16 DIAGNOSIS — K912 Postsurgical malabsorption, not elsewhere classified: Secondary | ICD-10-CM

## 2014-05-16 DIAGNOSIS — Z8719 Personal history of other diseases of the digestive system: Secondary | ICD-10-CM

## 2014-05-16 DIAGNOSIS — I5022 Chronic systolic (congestive) heart failure: Secondary | ICD-10-CM

## 2014-05-16 NOTE — Assessment & Plan Note (Signed)
Doing very well since being on Gattex  Continue limited TPN  F/u Carlean Purl

## 2014-05-16 NOTE — Assessment & Plan Note (Signed)
Dr Humphrey Rolls and her Gi/Medical doctors were involved with stopping her anticoagulation.  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  Not clear to me why anticoagulation would be stopped after being on Gattex.  Asked her to discuss with oncology and primary and at least repeat lab work for hypercoagulability panel

## 2014-05-16 NOTE — Patient Instructions (Signed)
Your physician wants you to follow-up in: YEAR WITH DR NISHAN  You will receive a reminder letter in the mail two months in advance. If you don't receive a letter, please call our office to schedule the follow-up appointment.  Your physician recommends that you continue on your current medications as directed. Please refer to the Current Medication list given to you today. 

## 2014-05-16 NOTE — Assessment & Plan Note (Signed)
Euvolemic no clinical symptoms continue current medical Rx

## 2014-05-16 NOTE — Progress Notes (Signed)
Quick Note:  Please forward to home TPN provider ______

## 2014-05-30 ENCOUNTER — Other Ambulatory Visit: Payer: Self-pay | Admitting: General Practice

## 2014-05-30 MED ORDER — ONDANSETRON HCL 4 MG PO TABS: 4.0000 mg | ORAL_TABLET | Freq: Three times a day (TID) | ORAL | Status: DC | PRN

## 2014-06-05 ENCOUNTER — Other Ambulatory Visit (INDEPENDENT_AMBULATORY_CARE_PROVIDER_SITE_OTHER): Payer: BLUE CROSS/BLUE SHIELD

## 2014-06-05 DIAGNOSIS — K912 Postsurgical malabsorption, not elsewhere classified: Secondary | ICD-10-CM

## 2014-06-05 LAB — COMPREHENSIVE METABOLIC PANEL
ALBUMIN: 3.1 g/dL — AB (ref 3.5–5.2)
ALT: 15 U/L (ref 0–35)
AST: 12 U/L (ref 0–37)
Alkaline Phosphatase: 77 U/L (ref 39–117)
BILIRUBIN TOTAL: 0.4 mg/dL (ref 0.2–1.2)
BUN: 17 mg/dL (ref 6–23)
CHLORIDE: 110 meq/L (ref 96–112)
CO2: 22 meq/L (ref 19–32)
Calcium: 8.6 mg/dL (ref 8.4–10.5)
Creatinine, Ser: 0.95 mg/dL (ref 0.40–1.20)
GFR: 62.93 mL/min (ref >60.00)
Glucose, Bld: 98 mg/dL (ref 70–99)
Potassium: 3.4 mEq/L — ABNORMAL LOW (ref 3.5–5.1)
Sodium: 140 mEq/L (ref 135–145)
Total Protein: 7.3 g/dL (ref 6.0–8.3)

## 2014-06-05 LAB — CBC WITH DIFFERENTIAL/PLATELET
BASOS PCT: 0.5 % (ref 0.0–3.0)
Basophils Absolute: 0 10*3/uL (ref 0.0–0.1)
EOS ABS: 0.2 10*3/uL (ref 0.0–0.7)
EOS PCT: 3.6 % (ref 0.0–5.0)
HCT: 32 % — ABNORMAL LOW (ref 36.0–46.0)
Hemoglobin: 10.5 g/dL — ABNORMAL LOW (ref 12.0–15.0)
LYMPHS ABS: 1.5 10*3/uL (ref 0.7–4.0)
Lymphocytes Relative: 23.3 % (ref 12.0–46.0)
MCHC: 33 g/dL (ref 30.0–36.0)
MCV: 92.5 fl (ref 78.0–100.0)
Monocytes Absolute: 0.5 10*3/uL (ref 0.1–1.0)
Monocytes Relative: 7.5 % (ref 3.0–12.0)
Neutro Abs: 4.1 10*3/uL (ref 1.4–7.7)
Neutrophils Relative %: 65.1 % (ref 43.0–77.0)
Platelets: 100 10*3/uL — ABNORMAL LOW (ref 150.0–400.0)
RBC: 3.46 Mil/uL — ABNORMAL LOW (ref 3.87–5.11)
RDW: 13.4 % (ref 11.5–15.5)
WBC: 6.2 10*3/uL (ref 4.0–10.5)

## 2014-06-05 LAB — C-REACTIVE PROTEIN: CRP: 2.7 mg/dL (ref 0.5–20.0)

## 2014-06-05 LAB — MAGNESIUM: Magnesium: 1.7 mg/dL (ref 1.5–2.5)

## 2014-06-05 LAB — BILIRUBIN, DIRECT: BILIRUBIN DIRECT: 0.1 mg/dL (ref 0.0–0.3)

## 2014-06-05 LAB — PHOSPHORUS: Phosphorus: 4 mg/dL (ref 2.3–4.6)

## 2014-06-05 LAB — TRIGLYCERIDES: Triglycerides: 195 mg/dL — ABNORMAL HIGH (ref 0.0–149.0)

## 2014-06-05 LAB — PREALBUMIN: Prealbumin: 13.3 mg/dL — ABNORMAL LOW (ref 17.0–34.0)

## 2014-06-05 NOTE — Progress Notes (Signed)
Quick Note:  Please send to pharmacist ______

## 2014-06-26 ENCOUNTER — Other Ambulatory Visit (INDEPENDENT_AMBULATORY_CARE_PROVIDER_SITE_OTHER): Payer: BLUE CROSS/BLUE SHIELD

## 2014-06-26 DIAGNOSIS — K912 Postsurgical malabsorption, not elsewhere classified: Secondary | ICD-10-CM

## 2014-06-26 LAB — COMPREHENSIVE METABOLIC PANEL
ALK PHOS: 72 U/L (ref 39–117)
ALT: 14 U/L (ref 0–35)
AST: 12 U/L (ref 0–37)
Albumin: 3.2 g/dL — ABNORMAL LOW (ref 3.5–5.2)
BUN: 16 mg/dL (ref 6–23)
CALCIUM: 8.6 mg/dL (ref 8.4–10.5)
CHLORIDE: 110 meq/L (ref 96–112)
CO2: 24 mEq/L (ref 19–32)
Creatinine, Ser: 0.99 mg/dL (ref 0.40–1.20)
GFR: 59.99 mL/min — AB (ref >60.00)
Glucose, Bld: 84 mg/dL (ref 70–99)
POTASSIUM: 3.3 meq/L — AB (ref 3.5–5.1)
Sodium: 139 mEq/L (ref 135–145)
TOTAL PROTEIN: 7.4 g/dL (ref 6.0–8.3)
Total Bilirubin: 0.5 mg/dL (ref 0.2–1.2)

## 2014-06-26 LAB — PREALBUMIN: Prealbumin: 15.7 mg/dL — ABNORMAL LOW (ref 17.0–34.0)

## 2014-06-26 LAB — CBC WITH DIFFERENTIAL/PLATELET
Basophils Absolute: 0 K/uL (ref 0.0–0.1)
Basophils Relative: 0.5 % (ref 0.0–3.0)
Eosinophils Absolute: 0.3 K/uL (ref 0.0–0.7)
Eosinophils Relative: 5.6 % — ABNORMAL HIGH (ref 0.0–5.0)
HCT: 30.5 % — ABNORMAL LOW (ref 36.0–46.0)
Hemoglobin: 10.1 g/dL — ABNORMAL LOW (ref 12.0–15.0)
Lymphocytes Relative: 34.6 % (ref 12.0–46.0)
Lymphs Abs: 1.7 K/uL (ref 0.7–4.0)
MCHC: 33.2 g/dL (ref 30.0–36.0)
MCV: 91.5 fl (ref 78.0–100.0)
Monocytes Absolute: 0.4 K/uL (ref 0.1–1.0)
Monocytes Relative: 9.1 % (ref 3.0–12.0)
Neutro Abs: 2.4 K/uL (ref 1.4–7.7)
Neutrophils Relative %: 50.2 % (ref 43.0–77.0)
Platelets: 114 K/uL — ABNORMAL LOW (ref 150.0–400.0)
RBC: 3.33 Mil/uL — ABNORMAL LOW (ref 3.87–5.11)
RDW: 13.9 % (ref 11.5–15.5)
WBC: 4.8 K/uL (ref 4.0–10.5)

## 2014-06-26 LAB — TRIGLYCERIDES: Triglycerides: 160 mg/dL — ABNORMAL HIGH (ref 0.0–149.0)

## 2014-06-26 LAB — C-REACTIVE PROTEIN: CRP: 1.2 mg/dL (ref 0.5–20.0)

## 2014-06-26 LAB — MAGNESIUM: MAGNESIUM: 1.8 mg/dL (ref 1.5–2.5)

## 2014-06-26 LAB — PHOSPHORUS: PHOSPHORUS: 4.1 mg/dL (ref 2.3–4.6)

## 2014-06-26 LAB — BILIRUBIN, DIRECT: BILIRUBIN DIRECT: 0.1 mg/dL (ref 0.0–0.3)

## 2014-06-27 ENCOUNTER — Other Ambulatory Visit: Payer: Self-pay

## 2014-06-27 ENCOUNTER — Ambulatory Visit (HOSPITAL_BASED_OUTPATIENT_CLINIC_OR_DEPARTMENT_OTHER): Payer: BLUE CROSS/BLUE SHIELD

## 2014-06-27 DIAGNOSIS — N189 Chronic kidney disease, unspecified: Secondary | ICD-10-CM

## 2014-06-27 DIAGNOSIS — D638 Anemia in other chronic diseases classified elsewhere: Secondary | ICD-10-CM

## 2014-06-27 DIAGNOSIS — D631 Anemia in chronic kidney disease: Secondary | ICD-10-CM

## 2014-06-27 MED ORDER — OMEPRAZOLE 40 MG PO CPDR: DELAYED_RELEASE_CAPSULE | ORAL | Status: DC

## 2014-06-27 MED ORDER — DARBEPOETIN ALFA 300 MCG/0.6ML IJ SOSY
300.0000 ug | PREFILLED_SYRINGE | Freq: Once | INTRAMUSCULAR | Status: AC
  Administered 2014-06-27: 300 ug via SUBCUTANEOUS
  Filled 2014-06-27: qty 0.6

## 2014-06-27 NOTE — Progress Notes (Signed)
Quick Note:  Please forward to home agency Also ask Sarya to schedule a non-urgent f/u w/ me ______

## 2014-07-04 ENCOUNTER — Telehealth: Payer: Self-pay | Admitting: Hematology and Oncology

## 2014-07-04 ENCOUNTER — Ambulatory Visit (HOSPITAL_BASED_OUTPATIENT_CLINIC_OR_DEPARTMENT_OTHER): Payer: BLUE CROSS/BLUE SHIELD | Admitting: Hematology and Oncology

## 2014-07-04 VITALS — BP 119/67 | HR 61 | Temp 98.4°F | Resp 18 | Ht 67.0 in | Wt 143.5 lb

## 2014-07-04 DIAGNOSIS — D696 Thrombocytopenia, unspecified: Secondary | ICD-10-CM

## 2014-07-04 DIAGNOSIS — D638 Anemia in other chronic diseases classified elsewhere: Secondary | ICD-10-CM

## 2014-07-04 NOTE — Assessment & Plan Note (Signed)
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 She received last Aranesp injection February 2016. Today her hemoglobin is 10.1 and does not need Aranesp injection. She has required a total of 3 Aranesp injections and whole of 2015. While she received at least 10 injections in 2014.  Plan: 1. Patient gets weekly labs with [place] regarding her TPN and if her hemoglobin drops below 10 g, she will call us and we will set her up for Aranesp injections as needed. 2. Return to clinic in 6 months for follow-up  Thrombocytopenia: Platelets 114.  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 This is the reason for her chronic disease.

## 2014-07-04 NOTE — Progress Notes (Signed)
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 She reports that with this approach she has been doing much better. Her energy levels have remained stable.  REVIEW OF SYSTEMS:   Constitutional: Denies fevers, chills or abnormal weight loss Eyes: Denies blurriness of vision Ears, nose, mouth, throat, and face: Denies mucositis or sore throat Respiratory: Denies cough, dyspnea or wheezes Cardiovascular: Denies palpitation, chest discomfort or lower extremity swelling Gastrointestinal:  Denies nausea, heartburn or change in bowel habits Skin: Denies abnormal skin rashes Lymphatics: Denies new lymphadenopathy or easy bruising Neurological:Denies numbness, tingling or new weaknesses Behavioral/Psych: Mood is stable, no new changes  Breast:  denies any pain or lumps or nodules in either breasts All other systems were reviewed with the patient and are negative.  I have reviewed the past medical history, past surgical history, social history and family history with the patient and they are unchanged from previous note.  ALLERGIES:  is allergic to iohexol and penicillins.  MEDICATIONS:  Current Outpatient Prescriptions  Medication Sig Dispense Refill  . ADULT TPN Inject 2,100 mLs into the vein continuous. Infuse 2191m over 10 hours with 1 hour ramp up and 1 hour ramp down. Total bag per day provides: 250g Dextrose 70%,  100g Travasol 10%, 45g Intralipids 30%, various electrolytes, MVI, TE, Zinc, selenium.    .Marland Kitchenaspirin EC 81 MG tablet Take 1 tablet (81 mg total) by mouth daily.    . ciprofloxacin (CIPRO) 500 MG tablet Take 1 tablet (500 mg total) by mouth 2 (two) times daily. 28 tablet 5  . clobetasol ointment (TEMOVATE) 01.61% Apply 1 application topically 2 (two) times daily. 30 g 0  . diphenhydrAMINE (BENADRYL) 25 mg capsule Take 25 mg by mouth every 6 (six) hours as needed. For itching/sleep.    . diphenoxylate-atropine (LOMOTIL) 2.5-0.025 MG per tablet Take 2 tablets by mouth 1/2 hour before meals. 180 tablet 1  . loperamide (IMODIUM A-D) 2 MG tablet Take 2 tablets 30 minutes prior to each meal 180 tablet 6  . Multiple Vitamin (MULTIVITAMIN WITH MINERALS) TABS Take 1 tablet by mouth daily.    .Marland Kitchenomeprazole (PRILOSEC) 40 MG capsule TAKE ONE CAPSULE BY MOUTH TWICE DAILY AT 10 AM AND 5 PM 60 capsule 5  . ondansetron (ZOFRAN) 4 MG tablet Take 1 tablet (4 mg total) by mouth every 8 (eight) hours as needed for nausea or vomiting. 60 tablet 1  . pravastatin (PRAVACHOL) 20 MG tablet Take 1 tablet (20 mg total) by mouth every evening. 30 tablet 11  . Teduglutide, rDNA, (GATTEX) 5 MG KIT Inject 3.3 Units into the skin daily. 1 kit 5  . mupirocin ointment (BACTROBAN) 2 % Apply 1 application topically 2 (two) times daily. Apply to lt forearm area bid (Patient not taking: Reported on 07/04/2014) 22 g 0   No current facility-administered medications for this visit.    PHYSICAL EXAMINATION: ECOG PERFORMANCE STATUS: 0 - Asymptomatic  Filed Vitals:   07/04/14 1004  BP: 119/67  Pulse:  61  Temp: 98.4 F (36.9 C)  Resp: 18   Filed Weights   07/04/14 1004  Weight: 143 lb 8 oz (65.091 kg)    GENERAL:alert, no distress and comfortable SKIN: skin color, texture, turgor are normal, no rashes or significant lesions EYES: normal, Conjunctiva are pink and non-injected, sclera clear OROPHARYNX:no exudate, no erythema and  lips, buccal mucosa, and tongue normal  NECK: supple, thyroid normal size, non-tender, without nodularity LYMPH:  no palpable lymphadenopathy in the cervical, axillary or inguinal LUNGS: clear to auscultation and percussion with normal breathing effort HEART: regular rate & rhythm and no murmurs and no lower extremity edema ABDOMEN:abdomen soft, non-tender and normal bowel sounds Musculoskeletal:no cyanosis of digits and no clubbing  NEURO: alert & oriented x 3 with fluent speech, no focal motor/sensory deficits  LABORATORY DATA:  I have reviewed the data as listed   Chemistry      Component Value Date/Time   NA 139 06/26/2014 1121   NA 139 01/13/2014 0951   NA 140 03/01/2009 1029   K 3.3* 06/26/2014 1121   K 3.6 01/13/2014 0951   K 4.2 03/01/2009 1029   CL 110 06/26/2014 1121   CL 106 10/22/2012 1005   CL 104 03/01/2009 1029   CO2 24 06/26/2014 1121   CO2 21* 01/13/2014 0951   CO2 25 03/01/2009 1029   BUN 16 06/26/2014 1121   BUN 14.6 01/13/2014 0951   BUN 17 03/01/2009 1029   CREATININE 0.99 06/26/2014 1121   CREATININE 1.0 01/13/2014 0951   CREATININE 0.5* 03/01/2009 1029      Component Value Date/Time   CALCIUM 8.6 06/26/2014 1121   CALCIUM 8.9 01/13/2014 0951   CALCIUM 8.7 03/01/2009 1029   ALKPHOS 72 06/26/2014 1121   ALKPHOS 85 01/13/2014 0951   ALKPHOS 143* 03/01/2009 1029   AST 12 06/26/2014 1121   AST 19 01/13/2014 0951   AST 46* 03/01/2009 1029   ALT 14 06/26/2014 1121   ALT 20 01/13/2014 0951   ALT 49* 03/01/2009 1029   BILITOT 0.5 06/26/2014 1121   BILITOT 0.26 01/13/2014 0951   BILITOT 0.40 03/01/2009 1029       Lab Results  Component Value Date   WBC 4.8 06/26/2014   HGB 10.1* 06/26/2014   HCT 30.5* 06/26/2014   MCV 91.5 06/26/2014   PLT 114.0* 06/26/2014   NEUTROABS 2.4 06/26/2014   ASSESSMENT & PLAN:  Anemia of chronic disease Normochromic normocytic anemia due 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  She received last Aranesp injection February 2016. Today her hemoglobin is 10.1 and does not need Aranesp injection. She has required a total of 3 Aranesp injections and whole of 2015. While she received at least 10 injections in 2014.  Plan: 1. Patient gets weekly labs with [facility] regarding her TPN and if her hemoglobin drops below 10 g, she will call us and we will set her up for Aranesp injections as needed. 2. Return to clinic in 6 months for follow-up  Thrombocytopenia: Platelets 114.  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 This is the reason for her chronic disease.    No orders of the defined types were placed in this encounter.   The patient has a good understanding of the overall plan. she agrees with it. She will  call with any problems that may develop before her next visit here.   Rulon Eisenmenger, MD

## 2014-07-04 NOTE — Telephone Encounter (Signed)
no vm.....mailed pt appt sched/avs and letter °

## 2014-07-19 ENCOUNTER — Telehealth: Payer: Self-pay | Admitting: Internal Medicine

## 2014-07-19 MED ORDER — CIPROFLOXACIN HCL 500 MG PO TABS: 500.0000 mg | ORAL_TABLET | Freq: Two times a day (BID) | ORAL | Status: DC

## 2014-07-19 NOTE — Telephone Encounter (Signed)
Refill x6

## 2014-07-19 NOTE — Telephone Encounter (Signed)
Ok to refill Sir? 

## 2014-07-19 NOTE — Telephone Encounter (Signed)
Informed patient that cipro sent in as requested .  She said we will see her at her upcoming appointment [name] [name].

## 2014-07-24 ENCOUNTER — Other Ambulatory Visit (INDEPENDENT_AMBULATORY_CARE_PROVIDER_SITE_OTHER): Payer: BLUE CROSS/BLUE SHIELD

## 2014-07-24 DIAGNOSIS — K912 Postsurgical malabsorption, not elsewhere classified: Secondary | ICD-10-CM

## 2014-07-24 LAB — CBC WITH DIFFERENTIAL/PLATELET
BASOS PCT: 0.5 % (ref 0.0–3.0)
Basophils Absolute: 0 10*3/uL (ref 0.0–0.1)
EOS PCT: 4.1 % (ref 0.0–5.0)
Eosinophils Absolute: 0.2 10*3/uL (ref 0.0–0.7)
HCT: 31.6 % — ABNORMAL LOW (ref 36.0–46.0)
HEMOGLOBIN: 10.4 g/dL — AB (ref 12.0–15.0)
Lymphocytes Relative: 28.4 % (ref 12.0–46.0)
Lymphs Abs: 1.6 10*3/uL (ref 0.7–4.0)
MCHC: 33.1 g/dL (ref 30.0–36.0)
MCV: 92.4 fl (ref 78.0–100.0)
MONO ABS: 0.4 10*3/uL (ref 0.1–1.0)
MONOS PCT: 6.9 % (ref 3.0–12.0)
NEUTROS PCT: 60.1 % (ref 43.0–77.0)
Neutro Abs: 3.4 10*3/uL (ref 1.4–7.7)
Platelets: 105 10*3/uL — ABNORMAL LOW (ref 150.0–400.0)
RBC: 3.42 Mil/uL — AB (ref 3.87–5.11)
RDW: 14.7 % (ref 11.5–15.5)
WBC: 5.6 10*3/uL (ref 4.0–10.5)

## 2014-07-24 LAB — PHOSPHORUS: PHOSPHORUS: 3.5 mg/dL (ref 2.3–4.6)

## 2014-07-24 LAB — COMPREHENSIVE METABOLIC PANEL
ALBUMIN: 3.3 g/dL — AB (ref 3.5–5.2)
ALT: 16 U/L (ref 0–35)
AST: 12 U/L (ref 0–37)
Alkaline Phosphatase: 74 U/L (ref 39–117)
BILIRUBIN TOTAL: 0.4 mg/dL (ref 0.2–1.2)
BUN: 15 mg/dL (ref 6–23)
CO2: 27 mEq/L (ref 19–32)
Calcium: 8.8 mg/dL (ref 8.4–10.5)
Chloride: 107 mEq/L (ref 96–112)
Creatinine, Ser: 0.89 mg/dL (ref 0.40–1.20)
GFR: 67.82 mL/min (ref >60.00)
GLUCOSE: 85 mg/dL (ref 70–99)
Potassium: 3.6 mEq/L (ref 3.5–5.1)
Sodium: 137 mEq/L (ref 135–145)
TOTAL PROTEIN: 7.2 g/dL (ref 6.0–8.3)

## 2014-07-24 LAB — C-REACTIVE PROTEIN: CRP: 2.7 mg/dL (ref 0.5–20.0)

## 2014-07-24 LAB — TRIGLYCERIDES: Triglycerides: 217 mg/dL — ABNORMAL HIGH (ref 0.0–149.0)

## 2014-07-24 LAB — BILIRUBIN, DIRECT: Bilirubin, Direct: 0.1 mg/dL (ref 0.0–0.3)

## 2014-07-24 LAB — MAGNESIUM: Magnesium: 1.8 mg/dL (ref 1.5–2.5)

## 2014-07-24 NOTE — Progress Notes (Signed)
Quick Note:  Please forward to home TPN agency ______

## 2014-07-25 LAB — PREALBUMIN: PREALBUMIN: 18.2 mg/dL (ref 17.0–34.0)

## 2014-07-25 NOTE — Progress Notes (Signed)
Quick Note:  Please send to TPN agency ______

## 2014-08-14 ENCOUNTER — Other Ambulatory Visit (INDEPENDENT_AMBULATORY_CARE_PROVIDER_SITE_OTHER): Payer: BLUE CROSS/BLUE SHIELD

## 2014-08-14 DIAGNOSIS — K912 Postsurgical malabsorption, not elsewhere classified: Secondary | ICD-10-CM

## 2014-08-14 LAB — MAGNESIUM: Magnesium: 1.8 mg/dL (ref 1.5–2.5)

## 2014-08-14 LAB — CBC WITH DIFFERENTIAL/PLATELET
Basophils Absolute: 0 10*3/uL (ref 0.0–0.1)
Basophils Relative: 0.6 % (ref 0.0–3.0)
EOS ABS: 0.3 10*3/uL (ref 0.0–0.7)
Eosinophils Relative: 5 % (ref 0.0–5.0)
HCT: 29 % — ABNORMAL LOW (ref 36.0–46.0)
Hemoglobin: 9.8 g/dL — ABNORMAL LOW (ref 12.0–15.0)
Lymphocytes Relative: 31.7 % (ref 12.0–46.0)
Lymphs Abs: 1.6 10*3/uL (ref 0.7–4.0)
MCHC: 33.7 g/dL (ref 30.0–36.0)
MCV: 91 fl (ref 78.0–100.0)
MONO ABS: 0.3 10*3/uL (ref 0.1–1.0)
Monocytes Relative: 6.6 % (ref 3.0–12.0)
NEUTROS PCT: 56.1 % (ref 43.0–77.0)
Neutro Abs: 2.8 10*3/uL (ref 1.4–7.7)
PLATELETS: 83 10*3/uL — AB (ref 150.0–400.0)
RBC: 3.19 Mil/uL — ABNORMAL LOW (ref 3.87–5.11)
RDW: 14.4 % (ref 11.5–15.5)
WBC: 5.1 10*3/uL (ref 4.0–10.5)

## 2014-08-14 LAB — COMPREHENSIVE METABOLIC PANEL
ALBUMIN: 3.1 g/dL — AB (ref 3.5–5.2)
ALT: 13 U/L (ref 0–35)
AST: 11 U/L (ref 0–37)
Alkaline Phosphatase: 68 U/L (ref 39–117)
BUN: 17 mg/dL (ref 6–23)
CO2: 22 mEq/L (ref 19–32)
Calcium: 8.7 mg/dL (ref 8.4–10.5)
Chloride: 109 mEq/L (ref 96–112)
Creatinine, Ser: 0.94 mg/dL (ref 0.40–1.20)
GFR: 63.66 mL/min (ref >60.00)
Glucose, Bld: 85 mg/dL (ref 70–99)
POTASSIUM: 3.3 meq/L — AB (ref 3.5–5.1)
SODIUM: 137 meq/L (ref 135–145)
Total Bilirubin: 0.6 mg/dL (ref 0.2–1.2)
Total Protein: 7.2 g/dL (ref 6.0–8.3)

## 2014-08-14 LAB — BILIRUBIN, DIRECT: Bilirubin, Direct: 0.2 mg/dL (ref 0.0–0.3)

## 2014-08-14 LAB — PHOSPHORUS: Phosphorus: 3.8 mg/dL (ref 2.3–4.6)

## 2014-08-14 LAB — TRIGLYCERIDES: Triglycerides: 197 mg/dL — ABNORMAL HIGH (ref 0.0–149.0)

## 2014-08-14 LAB — C-REACTIVE PROTEIN: CRP: 4.1 mg/dL (ref 0.5–20.0)

## 2014-08-14 NOTE — Progress Notes (Signed)
Quick Note:  Please forward labs to TPN agency ______

## 2014-08-15 LAB — PREALBUMIN: PREALBUMIN: 19 mg/dL (ref 17–34)

## 2014-08-15 NOTE — Progress Notes (Signed)
Quick Note:  Please send to TPN agency ______

## 2014-08-17 ENCOUNTER — Ambulatory Visit (INDEPENDENT_AMBULATORY_CARE_PROVIDER_SITE_OTHER): Payer: BLUE CROSS/BLUE SHIELD | Admitting: Internal Medicine

## 2014-08-17 ENCOUNTER — Encounter: Payer: Self-pay | Admitting: Internal Medicine

## 2014-08-17 VITALS — BP 110/56 | HR 68 | Ht 66.5 in | Wt 139.0 lb

## 2014-08-17 DIAGNOSIS — K912 Postsurgical malabsorption, not elsewhere classified: Secondary | ICD-10-CM

## 2014-08-17 DIAGNOSIS — D638 Anemia in other chronic diseases classified elsewhere: Secondary | ICD-10-CM

## 2014-08-17 DIAGNOSIS — K6389 Other specified diseases of intestine: Secondary | ICD-10-CM

## 2014-08-17 DIAGNOSIS — D696 Thrombocytopenia, unspecified: Secondary | ICD-10-CM

## 2014-08-17 DIAGNOSIS — K90829 Short bowel syndrome, unspecified: Secondary | ICD-10-CM

## 2014-08-17 DIAGNOSIS — M810 Age-related osteoporosis without current pathological fracture: Secondary | ICD-10-CM

## 2014-08-17 NOTE — Assessment & Plan Note (Signed)
Needs f/u [name]

## 2014-08-17 NOTE — Assessment & Plan Note (Signed)
If Hgb stays below 10 aranesp per Dr. Lindi Adie

## 2014-08-17 NOTE — Progress Notes (Signed)
   Subjective:    Patient ID: [name] [name], female    DOB: [birth year], 64 y.o.   MRN: [id number] Chief complaint: Follow-up for short-bowel syndrome, on home TPN HPI [name] [name] reports that she is doing reasonably well, she lost a little weight now that she is only on TPN 3 nights a week but she's thrilled that that's all see has to do. She says that the pharmacist may put some more lipids in her TPN. No problems with her PICC line now it was replaced last year. Bacterial overgrowth treatment is ongoing, she has intermittent diarrhea depending upon what she eats. Overall things are stable.  She notes that her hemoglobin is 9.8 and that Dr. Lindi Adie told her when it went below 10 she could go on her in as began but she wants to wait to see if this persists as it's the first time in a while its been below 10. She feels okay she does not significant fatigue.  She now has a full set of dentures and is happy with that.  Medications, allergies, past medical history, past surgical history, family history and social history are reviewed and updated in the EMR.  Review of Systems As above    Objective:   Physical Exam @BP  110/56 mmHg  Pulse 68  Ht 5' 6.5" (1.689 m)  Wt 139 lb (63.05 kg)  BMI 22.10 kg/m2  LMP 02/26/2014@  General:  NAD Eyes:   anicteric Lungs:  clear Heart:: S1S2 no rubs, murmurs or gallops Abdomen:  soft and nontender, BS+ mother is typical scar tissue in the right lower quadrant as before, no hepatosplenomegaly. There is increased tympany with gas-filled loops of bowel. Ext:   no edema, cyanosis or clubbing    Data Reviewed:  Recent labs, from 2016 are reviewed. Her last bone density scan was in 2015.    Assessment & Plan:  Acquired short bowel syndrome Stable overall with less need for TPN. There has been some mild weight loss, pharmacist will adjust nutritional supplementation.   Bacterial overgrowth syndrome Abx cycling will continue as she has.    Anemia of  chronic disease If Hgb stays below 10 aranesp per Dr. Lindi Adie   Osteoporosis Needs f/u [name]   Thrombocytopenia She has splenomegaly and may have portal hypertension. She is aware of the potential for liver problems with her chronic TPN. We talked about pursuing abdominal ultrasound with hepatic elastograophy but she has not gone to do so because she states will not change anything she is probably right. She could need a screening EGD to look for varices if cirrhotic, we will discuss this again in the future.    I appreciate the opportunity to care for this patient. CC: Annye Asa, MD Dr. Lindi Adie

## 2014-08-17 NOTE — Assessment & Plan Note (Addendum)
Stable overall with less need for TPN. There has been some mild weight loss, pharmacist will adjust nutritional supplementation.

## 2014-08-17 NOTE — Patient Instructions (Signed)
Please follow up with Dr. Carlean Purl in 6-12 months.   Please follow up with [name] regarding your osteoporosis.    I appreciate the opportunity to care for you.

## 2014-08-17 NOTE — Assessment & Plan Note (Addendum)
Abx cycling will continue as she has.

## 2014-08-17 NOTE — Assessment & Plan Note (Signed)
She has splenomegaly and may have portal hypertension. She is aware of the potential for liver problems with her chronic TPN. We talked about pursuing abdominal ultrasound with hepatic elastograophy but she has not gone to do so because she states will not change anything she is probably right. She could need a screening EGD to look for varices if cirrhotic, we will discuss this again in the future.

## 2014-08-18 ENCOUNTER — Telehealth: Payer: Self-pay | Admitting: Internal Medicine

## 2014-08-18 NOTE — Telephone Encounter (Signed)
Dr. Carlean Purl they made some changes on [name] [name]'s TPN recommendations. Will you please look at the paper in your folder and advise if changes are ok.  They need it back by Next Tuesday.  Thank you

## 2014-09-04 ENCOUNTER — Other Ambulatory Visit (INDEPENDENT_AMBULATORY_CARE_PROVIDER_SITE_OTHER): Payer: BLUE CROSS/BLUE SHIELD

## 2014-09-04 DIAGNOSIS — K912 Postsurgical malabsorption, not elsewhere classified: Secondary | ICD-10-CM | POA: Diagnosis not present

## 2014-09-04 LAB — TRIGLYCERIDES: TRIGLYCERIDES: 175 mg/dL — AB (ref 0.0–149.0)

## 2014-09-04 LAB — COMPREHENSIVE METABOLIC PANEL
ALBUMIN: 3.2 g/dL — AB (ref 3.5–5.2)
ALK PHOS: 74 U/L (ref 39–117)
ALT: 14 U/L (ref 0–35)
AST: 11 U/L (ref 0–37)
BUN: 14 mg/dL (ref 6–23)
CALCIUM: 8.4 mg/dL (ref 8.4–10.5)
CO2: 23 meq/L (ref 19–32)
Chloride: 109 mEq/L (ref 96–112)
Creatinine, Ser: 0.83 mg/dL (ref 0.40–1.20)
GFR: 73.48 mL/min (ref >60.00)
Glucose, Bld: 82 mg/dL (ref 70–99)
Potassium: 3.5 mEq/L (ref 3.5–5.1)
SODIUM: 137 meq/L (ref 135–145)
TOTAL PROTEIN: 7.3 g/dL (ref 6.0–8.3)
Total Bilirubin: 0.5 mg/dL (ref 0.2–1.2)

## 2014-09-04 LAB — CBC WITH DIFFERENTIAL/PLATELET
Basophils Absolute: 0 10*3/uL (ref 0.0–0.1)
Basophils Relative: 0.3 % (ref 0.0–3.0)
EOS ABS: 0.2 10*3/uL (ref 0.0–0.7)
EOS PCT: 3.6 % (ref 0.0–5.0)
HCT: 27.5 % — ABNORMAL LOW (ref 36.0–46.0)
Hemoglobin: 9.2 g/dL — ABNORMAL LOW (ref 12.0–15.0)
Lymphocytes Relative: 22.4 % (ref 12.0–46.0)
Lymphs Abs: 1.2 10*3/uL (ref 0.7–4.0)
MCHC: 33.4 g/dL (ref 30.0–36.0)
MCV: 92 fl (ref 78.0–100.0)
Monocytes Absolute: 0.4 10*3/uL (ref 0.1–1.0)
Monocytes Relative: 7.1 % (ref 3.0–12.0)
NEUTROS PCT: 66.6 % (ref 43.0–77.0)
Neutro Abs: 3.5 10*3/uL (ref 1.4–7.7)
Platelets: 91 10*3/uL — ABNORMAL LOW (ref 150.0–400.0)
RBC: 2.98 Mil/uL — AB (ref 3.87–5.11)
RDW: 14.8 % (ref 11.5–15.5)
WBC: 5.3 10*3/uL (ref 4.0–10.5)

## 2014-09-04 LAB — C-REACTIVE PROTEIN: CRP: 2 mg/dL (ref 0.5–20.0)

## 2014-09-04 LAB — MAGNESIUM: Magnesium: 1.9 mg/dL (ref 1.5–2.5)

## 2014-09-04 LAB — BILIRUBIN, DIRECT: BILIRUBIN DIRECT: 0.1 mg/dL (ref 0.0–0.3)

## 2014-09-04 LAB — PHOSPHORUS: PHOSPHORUS: 3.5 mg/dL (ref 2.3–4.6)

## 2014-09-05 LAB — PREALBUMIN: PREALBUMIN: 14 mg/dL — AB (ref 17–34)

## 2014-09-05 NOTE — Progress Notes (Signed)
Quick Note:  Please forward to home agency ______

## 2014-09-06 ENCOUNTER — Telehealth: Payer: Self-pay | Admitting: Hematology and Oncology

## 2014-09-06 ENCOUNTER — Telehealth: Payer: Self-pay

## 2014-09-06 NOTE — Telephone Encounter (Signed)
Pt called stating her hgb on Monday at [facility] was 9.2. This is verifiable in epic. She is requesting appt for aranesp shot. pof sent for injection per Dr Geralyn Flash OV note of 07/04/14. Pt aware to expect call.

## 2014-09-06 NOTE — Telephone Encounter (Signed)
s.w. pt and advised on 4.28 inj....pt ok and aware

## 2014-09-07 ENCOUNTER — Ambulatory Visit (HOSPITAL_BASED_OUTPATIENT_CLINIC_OR_DEPARTMENT_OTHER): Payer: BLUE CROSS/BLUE SHIELD

## 2014-09-07 VITALS — BP 117/60 | HR 57 | Temp 98.5°F

## 2014-09-07 DIAGNOSIS — D638 Anemia in other chronic diseases classified elsewhere: Secondary | ICD-10-CM

## 2014-09-07 DIAGNOSIS — D631 Anemia in chronic kidney disease: Secondary | ICD-10-CM | POA: Diagnosis not present

## 2014-09-07 DIAGNOSIS — N189 Chronic kidney disease, unspecified: Secondary | ICD-10-CM

## 2014-09-07 MED ORDER — DARBEPOETIN ALFA 300 MCG/0.6ML IJ SOSY
300.0000 ug | PREFILLED_SYRINGE | Freq: Once | INTRAMUSCULAR | Status: AC
  Administered 2014-09-07: 300 ug via SUBCUTANEOUS
  Filled 2014-09-07: qty 0.6

## 2014-09-11 ENCOUNTER — Other Ambulatory Visit: Payer: Self-pay

## 2014-09-11 MED ORDER — TEDUGLUTIDE (RDNA) 5 MG ~~LOC~~ KIT: 3.3000 [IU] | PACK | Freq: Every day | SUBCUTANEOUS | Status: DC

## 2014-09-26 ENCOUNTER — Other Ambulatory Visit (INDEPENDENT_AMBULATORY_CARE_PROVIDER_SITE_OTHER): Payer: BLUE CROSS/BLUE SHIELD

## 2014-09-26 DIAGNOSIS — K912 Postsurgical malabsorption, not elsewhere classified: Secondary | ICD-10-CM

## 2014-09-26 LAB — CBC WITH DIFFERENTIAL/PLATELET
BASOS ABS: 0 10*3/uL (ref 0.0–0.1)
Basophils Relative: 0.4 % (ref 0.0–3.0)
EOS ABS: 0.3 10*3/uL (ref 0.0–0.7)
Eosinophils Relative: 4.7 % (ref 0.0–5.0)
HCT: 32.3 % — ABNORMAL LOW (ref 36.0–46.0)
Hemoglobin: 10.7 g/dL — ABNORMAL LOW (ref 12.0–15.0)
LYMPHS PCT: 27.9 % (ref 12.0–46.0)
Lymphs Abs: 1.5 10*3/uL (ref 0.7–4.0)
MCHC: 33 g/dL (ref 30.0–36.0)
MCV: 93.1 fl (ref 78.0–100.0)
MONO ABS: 0.3 10*3/uL (ref 0.1–1.0)
Monocytes Relative: 6.1 % (ref 3.0–12.0)
NEUTROS PCT: 60.9 % (ref 43.0–77.0)
Neutro Abs: 3.3 10*3/uL (ref 1.4–7.7)
PLATELETS: 86 10*3/uL — AB (ref 150.0–400.0)
RBC: 3.48 Mil/uL — ABNORMAL LOW (ref 3.87–5.11)
RDW: 15.4 % (ref 11.5–15.5)
WBC: 5.4 10*3/uL (ref 4.0–10.5)

## 2014-09-26 LAB — BILIRUBIN, DIRECT: Bilirubin, Direct: 0.2 mg/dL (ref 0.0–0.3)

## 2014-09-26 LAB — COMPREHENSIVE METABOLIC PANEL
ALT: 19 U/L (ref 0–35)
AST: 19 U/L (ref 0–37)
Albumin: 3.4 g/dL — ABNORMAL LOW (ref 3.5–5.2)
Alkaline Phosphatase: 72 U/L (ref 39–117)
BUN: 28 mg/dL — ABNORMAL HIGH (ref 6–23)
CALCIUM: 8.9 mg/dL (ref 8.4–10.5)
CHLORIDE: 104 meq/L (ref 96–112)
CO2: 27 meq/L (ref 19–32)
Creatinine, Ser: 0.73 mg/dL (ref 0.40–1.20)
GFR: 85.2 mL/min (ref >60.00)
Glucose, Bld: 65 mg/dL — ABNORMAL LOW (ref 70–99)
Potassium: 3.5 mEq/L (ref 3.5–5.1)
SODIUM: 136 meq/L (ref 135–145)
TOTAL PROTEIN: 7.4 g/dL (ref 6.0–8.3)
Total Bilirubin: 0.4 mg/dL (ref 0.2–1.2)

## 2014-09-26 LAB — C-REACTIVE PROTEIN: CRP: 1 mg/dL (ref 0.5–20.0)

## 2014-09-26 LAB — PHOSPHORUS: Phosphorus: 2.6 mg/dL (ref 2.3–4.6)

## 2014-09-26 LAB — MAGNESIUM: Magnesium: 2 mg/dL (ref 1.5–2.5)

## 2014-09-26 LAB — TRIGLYCERIDES: Triglycerides: 58 mg/dL (ref 0.0–149.0)

## 2014-09-27 LAB — PREALBUMIN: Prealbumin: 17 mg/dL (ref 17–34)

## 2014-09-27 NOTE — Progress Notes (Signed)
Quick Note:  Forward to home health ______

## 2014-10-16 ENCOUNTER — Other Ambulatory Visit (INDEPENDENT_AMBULATORY_CARE_PROVIDER_SITE_OTHER): Payer: BLUE CROSS/BLUE SHIELD

## 2014-10-16 DIAGNOSIS — K912 Postsurgical malabsorption, not elsewhere classified: Secondary | ICD-10-CM

## 2014-10-16 LAB — COMPREHENSIVE METABOLIC PANEL
ALBUMIN: 3.3 g/dL — AB (ref 3.5–5.2)
ALK PHOS: 76 U/L (ref 39–117)
ALT: 18 U/L (ref 0–35)
AST: 16 U/L (ref 0–37)
BILIRUBIN TOTAL: 0.3 mg/dL (ref 0.2–1.2)
BUN: 16 mg/dL (ref 6–23)
CALCIUM: 8.7 mg/dL (ref 8.4–10.5)
CO2: 24 mEq/L (ref 19–32)
Chloride: 108 mEq/L (ref 96–112)
Creatinine, Ser: 0.94 mg/dL (ref 0.40–1.20)
GFR: 63.63 mL/min (ref >60.00)
GLUCOSE: 90 mg/dL (ref 70–99)
Potassium: 4 mEq/L (ref 3.5–5.1)
Sodium: 137 mEq/L (ref 135–145)
Total Protein: 7.5 g/dL (ref 6.0–8.3)

## 2014-10-16 LAB — CBC WITH DIFFERENTIAL/PLATELET
BASOS ABS: 0 10*3/uL (ref 0.0–0.1)
Basophils Relative: 0.6 % (ref 0.0–3.0)
EOS ABS: 0.5 10*3/uL (ref 0.0–0.7)
EOS PCT: 10.2 % — AB (ref 0.0–5.0)
HEMATOCRIT: 30.7 % — AB (ref 36.0–46.0)
Hemoglobin: 10.1 g/dL — ABNORMAL LOW (ref 12.0–15.0)
LYMPHS ABS: 1.8 10*3/uL (ref 0.7–4.0)
Lymphocytes Relative: 37.7 % (ref 12.0–46.0)
MCHC: 33 g/dL (ref 30.0–36.0)
MCV: 93.6 fl (ref 78.0–100.0)
Monocytes Absolute: 0.4 10*3/uL (ref 0.1–1.0)
Monocytes Relative: 7.6 % (ref 3.0–12.0)
Neutro Abs: 2.1 10*3/uL (ref 1.4–7.7)
Neutrophils Relative %: 43.9 % (ref 43.0–77.0)
Platelets: 93 10*3/uL — ABNORMAL LOW (ref 150.0–400.0)
RBC: 3.28 Mil/uL — ABNORMAL LOW (ref 3.87–5.11)
RDW: 14.4 % (ref 11.5–15.5)
WBC: 4.8 10*3/uL (ref 4.0–10.5)

## 2014-10-16 LAB — PHOSPHORUS: Phosphorus: 3.4 mg/dL (ref 2.3–4.6)

## 2014-10-16 LAB — C-REACTIVE PROTEIN: CRP: 1.4 mg/dL (ref 0.5–20.0)

## 2014-10-16 LAB — TRIGLYCERIDES: Triglycerides: 202 mg/dL — ABNORMAL HIGH (ref 0.0–149.0)

## 2014-10-16 LAB — MAGNESIUM: MAGNESIUM: 1.8 mg/dL (ref 1.5–2.5)

## 2014-10-16 LAB — BILIRUBIN, DIRECT: BILIRUBIN DIRECT: 0.1 mg/dL (ref 0.0–0.3)

## 2014-10-17 LAB — PREALBUMIN: Prealbumin: 20 mg/dL (ref 17–34)

## 2014-10-17 NOTE — Progress Notes (Signed)
Quick Note:  Labs stable/ok Please forward to pharmacist ______

## 2014-10-24 ENCOUNTER — Telehealth: Payer: Self-pay | Admitting: Internal Medicine

## 2014-10-24 NOTE — Telephone Encounter (Signed)
Orders have been faxed.  I have a faxed confirmation that they went through

## 2014-11-06 ENCOUNTER — Other Ambulatory Visit (INDEPENDENT_AMBULATORY_CARE_PROVIDER_SITE_OTHER): Payer: BLUE CROSS/BLUE SHIELD

## 2014-11-06 DIAGNOSIS — K912 Postsurgical malabsorption, not elsewhere classified: Secondary | ICD-10-CM

## 2014-11-06 LAB — CBC WITH DIFFERENTIAL/PLATELET
BASOS PCT: 1 % (ref 0.0–3.0)
Basophils Absolute: 0 10*3/uL (ref 0.0–0.1)
EOS PCT: 13.9 % — AB (ref 0.0–5.0)
Eosinophils Absolute: 0.5 10*3/uL (ref 0.0–0.7)
HCT: 29.4 % — ABNORMAL LOW (ref 36.0–46.0)
Hemoglobin: 9.6 g/dL — ABNORMAL LOW (ref 12.0–15.0)
LYMPHS ABS: 1.6 10*3/uL (ref 0.7–4.0)
Lymphocytes Relative: 42.5 % (ref 12.0–46.0)
MCHC: 32.8 g/dL (ref 30.0–36.0)
MCV: 94.6 fl (ref 78.0–100.0)
Monocytes Absolute: 0.3 10*3/uL (ref 0.1–1.0)
Monocytes Relative: 7.7 % (ref 3.0–12.0)
Neutro Abs: 1.3 10*3/uL — ABNORMAL LOW (ref 1.4–7.7)
Neutrophils Relative %: 34.9 % — ABNORMAL LOW (ref 43.0–77.0)
Platelets: 100 10*3/uL — ABNORMAL LOW (ref 150.0–400.0)
RBC: 3.11 Mil/uL — ABNORMAL LOW (ref 3.87–5.11)
RDW: 14.5 % (ref 11.5–15.5)
WBC: 3.7 10*3/uL — AB (ref 4.0–10.5)

## 2014-11-06 LAB — PHOSPHORUS: PHOSPHORUS: 3.6 mg/dL (ref 2.3–4.6)

## 2014-11-06 LAB — COMPREHENSIVE METABOLIC PANEL
ALT: 18 U/L (ref 0–35)
AST: 17 U/L (ref 0–37)
Albumin: 3.3 g/dL — ABNORMAL LOW (ref 3.5–5.2)
Alkaline Phosphatase: 85 U/L (ref 39–117)
BUN: 15 mg/dL (ref 6–23)
CALCIUM: 8.8 mg/dL (ref 8.4–10.5)
CO2: 25 meq/L (ref 19–32)
CREATININE: 0.93 mg/dL (ref 0.40–1.20)
Chloride: 106 mEq/L (ref 96–112)
GFR: 64.41 mL/min (ref >60.00)
Glucose, Bld: 80 mg/dL (ref 70–99)
Potassium: 3.8 mEq/L (ref 3.5–5.1)
Sodium: 136 mEq/L (ref 135–145)
Total Bilirubin: 0.3 mg/dL (ref 0.2–1.2)
Total Protein: 7.8 g/dL (ref 6.0–8.3)

## 2014-11-06 LAB — TRIGLYCERIDES: TRIGLYCERIDES: 179 mg/dL — AB (ref 0.0–149.0)

## 2014-11-06 LAB — MAGNESIUM: Magnesium: 1.9 mg/dL (ref 1.5–2.5)

## 2014-11-06 LAB — BILIRUBIN, DIRECT: Bilirubin, Direct: 0 mg/dL (ref 0.0–0.3)

## 2014-11-06 LAB — C-REACTIVE PROTEIN: CRP: 1.4 mg/dL (ref 0.5–20.0)

## 2014-11-07 LAB — PREALBUMIN: Prealbumin: 17 mg/dL (ref 17–34)

## 2014-11-09 NOTE — Progress Notes (Signed)
Quick Note:  Please forward to home TPN agency ______

## 2014-11-27 ENCOUNTER — Other Ambulatory Visit (INDEPENDENT_AMBULATORY_CARE_PROVIDER_SITE_OTHER): Payer: BLUE CROSS/BLUE SHIELD

## 2014-11-27 DIAGNOSIS — K912 Postsurgical malabsorption, not elsewhere classified: Secondary | ICD-10-CM

## 2014-11-27 LAB — CBC WITH DIFFERENTIAL/PLATELET
BASOS PCT: 0.4 % (ref 0.0–3.0)
Basophils Absolute: 0 10*3/uL (ref 0.0–0.1)
EOS ABS: 0.2 10*3/uL (ref 0.0–0.7)
Eosinophils Relative: 7.5 % — ABNORMAL HIGH (ref 0.0–5.0)
HEMATOCRIT: 29.7 % — AB (ref 36.0–46.0)
Hemoglobin: 9.8 g/dL — ABNORMAL LOW (ref 12.0–15.0)
LYMPHS ABS: 1.2 10*3/uL (ref 0.7–4.0)
Lymphocytes Relative: 35.7 % (ref 12.0–46.0)
MCHC: 33.1 g/dL (ref 30.0–36.0)
MCV: 94.5 fl (ref 78.0–100.0)
MONOS PCT: 8.5 % (ref 3.0–12.0)
Monocytes Absolute: 0.3 10*3/uL (ref 0.1–1.0)
Neutro Abs: 1.6 10*3/uL (ref 1.4–7.7)
Neutrophils Relative %: 47.9 % (ref 43.0–77.0)
PLATELETS: 87 10*3/uL — AB (ref 150.0–400.0)
RBC: 3.14 Mil/uL — ABNORMAL LOW (ref 3.87–5.11)
RDW: 15 % (ref 11.5–15.5)
WBC: 3.3 10*3/uL — AB (ref 4.0–10.5)

## 2014-11-27 LAB — COMPREHENSIVE METABOLIC PANEL
ALK PHOS: 89 U/L (ref 39–117)
ALT: 15 U/L (ref 0–35)
AST: 14 U/L (ref 0–37)
Albumin: 3.4 g/dL — ABNORMAL LOW (ref 3.5–5.2)
BILIRUBIN TOTAL: 0.5 mg/dL (ref 0.2–1.2)
BUN: 16 mg/dL (ref 6–23)
CHLORIDE: 106 meq/L (ref 96–112)
CO2: 25 mEq/L (ref 19–32)
Calcium: 8.8 mg/dL (ref 8.4–10.5)
Creatinine, Ser: 0.93 mg/dL (ref 0.40–1.20)
GFR: 64.39 mL/min (ref >60.00)
Glucose, Bld: 84 mg/dL (ref 70–99)
POTASSIUM: 4.2 meq/L (ref 3.5–5.1)
SODIUM: 138 meq/L (ref 135–145)
Total Protein: 7.9 g/dL (ref 6.0–8.3)

## 2014-11-27 LAB — BILIRUBIN, DIRECT: Bilirubin, Direct: 0.1 mg/dL (ref 0.0–0.3)

## 2014-11-27 LAB — PHOSPHORUS
PHOSPHORUS: 3.8 mg/dL (ref 2.3–4.6)
PHOSPHORUS: 3.8 mg/dL (ref 2.3–4.6)

## 2014-11-27 LAB — MAGNESIUM: Magnesium: 1.7 mg/dL (ref 1.5–2.5)

## 2014-11-27 LAB — TRIGLYCERIDES: TRIGLYCERIDES: 185 mg/dL — AB (ref 0.0–149.0)

## 2014-11-27 LAB — C-REACTIVE PROTEIN: CRP: 2.5 mg/dL (ref 0.5–20.0)

## 2014-11-28 LAB — PREALBUMIN: Prealbumin: 20 mg/dL (ref 17–34)

## 2014-11-30 NOTE — Progress Notes (Signed)
Quick Note:  Stable labs Cc to TPN agency please ______

## 2014-12-18 ENCOUNTER — Telehealth: Payer: Self-pay | Admitting: Internal Medicine

## 2014-12-18 ENCOUNTER — Other Ambulatory Visit (INDEPENDENT_AMBULATORY_CARE_PROVIDER_SITE_OTHER): Payer: BLUE CROSS/BLUE SHIELD

## 2014-12-18 DIAGNOSIS — K912 Postsurgical malabsorption, not elsewhere classified: Secondary | ICD-10-CM

## 2014-12-18 LAB — COMPREHENSIVE METABOLIC PANEL
ALK PHOS: 86 U/L (ref 39–117)
ALT: 19 U/L (ref 0–35)
AST: 15 U/L (ref 0–37)
Albumin: 3.5 g/dL (ref 3.5–5.2)
BUN: 13 mg/dL (ref 6–23)
CALCIUM: 8.9 mg/dL (ref 8.4–10.5)
CO2: 23 mEq/L (ref 19–32)
Chloride: 107 mEq/L (ref 96–112)
Creatinine, Ser: 0.91 mg/dL (ref 0.40–1.20)
GFR: 66.02 mL/min (ref >60.00)
GLUCOSE: 98 mg/dL (ref 70–99)
Potassium: 3.6 mEq/L (ref 3.5–5.1)
SODIUM: 137 meq/L (ref 135–145)
TOTAL PROTEIN: 8.2 g/dL (ref 6.0–8.3)
Total Bilirubin: 0.6 mg/dL (ref 0.2–1.2)

## 2014-12-18 LAB — CBC WITH DIFFERENTIAL/PLATELET
BASOS PCT: 0.5 % (ref 0.0–3.0)
Basophils Absolute: 0 10*3/uL (ref 0.0–0.1)
Eosinophils Absolute: 0.3 10*3/uL (ref 0.0–0.7)
Eosinophils Relative: 7.3 % — ABNORMAL HIGH (ref 0.0–5.0)
HCT: 30.1 % — ABNORMAL LOW (ref 36.0–46.0)
Hemoglobin: 10 g/dL — ABNORMAL LOW (ref 12.0–15.0)
LYMPHS PCT: 30.1 % (ref 12.0–46.0)
Lymphs Abs: 1.4 10*3/uL (ref 0.7–4.0)
MCHC: 33.1 g/dL (ref 30.0–36.0)
MCV: 94.7 fl (ref 78.0–100.0)
Monocytes Absolute: 0.4 10*3/uL (ref 0.1–1.0)
Monocytes Relative: 8.8 % (ref 3.0–12.0)
Neutro Abs: 2.5 10*3/uL (ref 1.4–7.7)
Neutrophils Relative %: 53.3 % (ref 43.0–77.0)
Platelets: 102 10*3/uL — ABNORMAL LOW (ref 150.0–400.0)
RBC: 3.18 Mil/uL — ABNORMAL LOW (ref 3.87–5.11)
RDW: 15.2 % (ref 11.5–15.5)
WBC: 4.8 10*3/uL (ref 4.0–10.5)

## 2014-12-18 LAB — TRIGLYCERIDES: Triglycerides: 198 mg/dL — ABNORMAL HIGH (ref 0.0–149.0)

## 2014-12-18 LAB — C-REACTIVE PROTEIN: CRP: 2.2 mg/dL (ref 0.5–20.0)

## 2014-12-18 LAB — MAGNESIUM: Magnesium: 1.7 mg/dL (ref 1.5–2.5)

## 2014-12-18 LAB — PHOSPHORUS: Phosphorus: 3.4 mg/dL (ref 2.3–4.6)

## 2014-12-18 LAB — BILIRUBIN, DIRECT: BILIRUBIN DIRECT: 0.1 mg/dL (ref 0.0–0.3)

## 2014-12-18 NOTE — Telephone Encounter (Signed)
Patient wanted to know what her hemoglobin was.  She is advised that it is 10.0.

## 2014-12-19 LAB — PREALBUMIN: Prealbumin: 23 mg/dL (ref 17–34)

## 2014-12-25 NOTE — Progress Notes (Signed)
Quick Note:  Forward to home TPN agency ______

## 2015-01-02 ENCOUNTER — Ambulatory Visit (HOSPITAL_BASED_OUTPATIENT_CLINIC_OR_DEPARTMENT_OTHER): Payer: BLUE CROSS/BLUE SHIELD | Admitting: Hematology and Oncology

## 2015-01-02 ENCOUNTER — Encounter: Payer: Self-pay | Admitting: Hematology and Oncology

## 2015-01-02 ENCOUNTER — Telehealth: Payer: Self-pay | Admitting: Hematology and Oncology

## 2015-01-02 VITALS — BP 107/53 | HR 76 | Temp 98.3°F | Resp 18 | Ht 66.5 in | Wt 139.9 lb

## 2015-01-02 DIAGNOSIS — K912 Postsurgical malabsorption, not elsewhere classified: Secondary | ICD-10-CM

## 2015-01-02 DIAGNOSIS — D638 Anemia in other chronic diseases classified elsewhere: Secondary | ICD-10-CM

## 2015-01-02 NOTE — Telephone Encounter (Signed)
Confirmed appointment for February.

## 2015-01-02 NOTE — Assessment & Plan Note (Signed)
Anemia of chronic disease Normochromic normocytic anemia due 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 She received last Aranesp injection February 2016. Today her hemoglobin is 10.1 and does not need Aranesp injection. She has required a total of 3 Aranesp injections in 2015, while she received at least 10 injections in 2014. So we discontinued therapy.  Current treatment: Observation Thrombocytopenia: Platelets 114.  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 This is the reason for her chronic disease.  Return to clinic in 6 months for follow-up

## 2015-01-02 NOTE — Progress Notes (Signed)
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 She gets IV TPN 4 times a week now. Her hemoglobin has been relatively stable between 9.5-10 g. She does not have any major symptoms related to anemia. She lost lots of vegetables and she eats them all the time. Because of this she lost a little bit more weight than usual.  REVIEW OF SYSTEMS:   Constitutional: Denies fevers, chills or abnormal weight loss Eyes: Denies blurriness of vision Ears, nose, mouth, throat, and face: Denies mucositis or sore throat Respiratory: Denies cough, dyspnea or wheezes Cardiovascular: Denies palpitation, chest discomfort or lower extremity swelling Gastrointestinal:  Short-bowel Skin: Denies abnormal skin rashes Lymphatics: Denies new lymphadenopathy or easy bruising Neurological:Denies numbness, tingling or new weaknesses Behavioral/Psych: Mood is stable, no new changes  All other systems were reviewed with the patient and are negative.  I have reviewed the past medical history, past surgical history, social history and family history with the patient and they are unchanged from previous note.  ALLERGIES:  is allergic to iohexol and penicillins.  MEDICATIONS:  Current Outpatient Prescriptions  Medication Sig Dispense Refill  . ADULT TPN Inject 2,100 mLs into the vein continuous. Infuse 2155ml over 10 hours with 1 hour ramp up and 1 hour ramp down. Total bag per day provides: 250g Dextrose 70%, 100g Travasol 10%,  45g Intralipids 30%, various electrolytes, MVI, TE, Zinc, selenium.    Marland Kitchen aspirin EC 81 MG tablet Take 1 tablet (81 mg total) by mouth daily.    . ciprofloxacin (CIPRO) 500 MG tablet Take 1 tablet (500 mg total) by mouth 2 (two) times daily. 28 tablet 5  . clobetasol ointment (TEMOVATE) 0.17 % Apply 1 application topically 2 (two) times daily. 30 g 0  . diphenhydrAMINE (BENADRYL) 25 mg capsule Take 25 mg by mouth every 6 (six) hours as needed. For itching/sleep.    . diphenoxylate-atropine (LOMOTIL) 2.5-0.025 MG per tablet Take 2 tablets by mouth 1/2 hour before meals. 180 tablet 1  . loperamide (IMODIUM A-D) 2 MG tablet Take 2 tablets 30 minutes prior to each meal 180 tablet 6  . Multiple Vitamin (MULTIVITAMIN WITH MINERALS) TABS Take 1 tablet by mouth daily.    . mupirocin ointment (BACTROBAN) 2 % Apply 1 application topically 2 (two) times daily. Apply to lt forearm area bid 22 g 0  . omeprazole (PRILOSEC) 40 MG capsule TAKE ONE CAPSULE BY MOUTH TWICE DAILY AT 10 AM AND 5 PM 60 capsule 5  . ondansetron (ZOFRAN) 4 MG tablet Take 1 tablet (4 mg total) by mouth every 8 (eight) hours as needed for nausea or vomiting. 60 tablet 1  . pravastatin (PRAVACHOL) 20 MG tablet Take 1 tablet (20 mg total) by mouth every evening. 30 tablet 11  . Teduglutide, rDNA, (GATTEX) 5 MG KIT Inject 3.3 Units into the skin daily. 1 kit 11   No current facility-administered medications for this visit.    PHYSICAL EXAMINATION: ECOG PERFORMANCE STATUS: 1 - Symptomatic but completely ambulatory  Filed Vitals:   01/02/15 1020  BP: 107/53  Pulse: 76  Temp: 98.3 F (  36.8 C)  Resp: 18   Filed Weights   01/02/15 1020  Weight: 139 lb 14.4 oz (63.458 kg)    GENERAL:alert, no distress and comfortable SKIN: skin color, texture, turgor are normal, no rashes or significant lesions EYES: normal, Conjunctiva are pink and non-injected, sclera clear OROPHARYNX:no exudate, no erythema and lips, buccal mucosa, and tongue  normal  NECK: supple, thyroid normal size, non-tender, without nodularity LYMPH:  no palpable lymphadenopathy in the cervical, axillary or inguinal LUNGS: clear to auscultation and percussion with normal breathing effort HEART: regular rate & rhythm and no murmurs and no lower extremity edema ABDOMEN:abdomen soft, non-tender and normal bowel sounds Musculoskeletal:no cyanosis of digits and no clubbing  NEURO: alert & oriented x 3 with fluent speech, no focal motor/sensory deficits  LABORATORY DATA:  I have reviewed the data as listed   Chemistry      Component Value Date/Time   NA 137 12/18/2014 1016   NA 139 01/13/2014 0951   NA 140 03/01/2009 1029   K 3.6 12/18/2014 1016   K 3.6 01/13/2014 0951   K 4.2 03/01/2009 1029   CL 107 12/18/2014 1016   CL 106 10/22/2012 1005   CL 104 03/01/2009 1029   CO2 23 12/18/2014 1016   CO2 21* 01/13/2014 0951   CO2 25 03/01/2009 1029   BUN 13 12/18/2014 1016   BUN 14.6 01/13/2014 0951   BUN 17 03/01/2009 1029   CREATININE 0.91 12/18/2014 1016   CREATININE 1.0 01/13/2014 0951   CREATININE 0.5* 03/01/2009 1029      Component Value Date/Time   CALCIUM 8.9 12/18/2014 1016   CALCIUM 8.9 01/13/2014 0951   CALCIUM 8.7 03/01/2009 1029   ALKPHOS 86 12/18/2014 1016   ALKPHOS 85 01/13/2014 0951   ALKPHOS 143* 03/01/2009 1029   AST 15 12/18/2014 1016   AST 19 01/13/2014 0951   AST 46* 03/01/2009 1029   ALT 19 12/18/2014 1016   ALT 20 01/13/2014 0951   ALT 49* 03/01/2009 1029   BILITOT 0.6 12/18/2014 1016   BILITOT 0.26 01/13/2014 0951   BILITOT 0.40 03/01/2009 1029       Lab Results  Component Value Date   WBC 4.8 12/18/2014   HGB 10.0* 12/18/2014   HCT 30.1* 12/18/2014   MCV 94.7 12/18/2014   PLT 102.0* 12/18/2014   NEUTROABS 2.5 12/18/2014   ASSESSMENT & PLAN:  Anemia of chronic disease Normochromic normocytic anemia due 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 She received last Aranesp  injection February 2016. Today her hemoglobin is 10.1 and does not need Aranesp injection. She has required a total of 3 Aranesp injections in 2015, while she received at least 10 injections in 2014. So we discontinued therapy.  Current treatment: Observation Thrombocytopenia: Platelets 102.  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 This is the reason for her chronic disease.  Return to clinic in 6 months for follow-up   No orders of the defined types were placed in this encounter.   The patient has a good understanding of the overall plan. she agrees with it. she will call with any problems that may develop before the next visit here.   Rulon Eisenmenger, MD

## 2015-01-08 ENCOUNTER — Telehealth: Payer: Self-pay | Admitting: Internal Medicine

## 2015-01-08 ENCOUNTER — Other Ambulatory Visit (INDEPENDENT_AMBULATORY_CARE_PROVIDER_SITE_OTHER): Payer: BLUE CROSS/BLUE SHIELD

## 2015-01-08 DIAGNOSIS — K912 Postsurgical malabsorption, not elsewhere classified: Secondary | ICD-10-CM

## 2015-01-08 LAB — COMPREHENSIVE METABOLIC PANEL
ALBUMIN: 3.5 g/dL (ref 3.5–5.2)
ALT: 15 U/L (ref 0–35)
AST: 14 U/L (ref 0–37)
Alkaline Phosphatase: 86 U/L (ref 39–117)
BUN: 13 mg/dL (ref 6–23)
CHLORIDE: 109 meq/L (ref 96–112)
CO2: 22 mEq/L (ref 19–32)
Calcium: 8.9 mg/dL (ref 8.4–10.5)
Creatinine, Ser: 0.91 mg/dL (ref 0.40–1.20)
GFR: 66.01 mL/min (ref >60.00)
GLUCOSE: 125 mg/dL — AB (ref 70–99)
POTASSIUM: 3.7 meq/L (ref 3.5–5.1)
SODIUM: 138 meq/L (ref 135–145)
Total Bilirubin: 0.4 mg/dL (ref 0.2–1.2)
Total Protein: 8.2 g/dL (ref 6.0–8.3)

## 2015-01-08 LAB — BILIRUBIN, DIRECT: Bilirubin, Direct: 0.1 mg/dL (ref 0.0–0.3)

## 2015-01-08 LAB — PHOSPHORUS: PHOSPHORUS: 3.6 mg/dL (ref 2.3–4.6)

## 2015-01-08 LAB — MAGNESIUM: MAGNESIUM: 1.9 mg/dL (ref 1.5–2.5)

## 2015-01-08 LAB — CBC WITH DIFFERENTIAL/PLATELET
Basophils Absolute: 0 10*3/uL (ref 0.0–0.1)
Basophils Relative: 0.7 % (ref 0.0–3.0)
Eosinophils Absolute: 0.5 10*3/uL (ref 0.0–0.7)
Eosinophils Relative: 11.3 % — ABNORMAL HIGH (ref 0.0–5.0)
HEMATOCRIT: 30.2 % — AB (ref 36.0–46.0)
Hemoglobin: 9.9 g/dL — ABNORMAL LOW (ref 12.0–15.0)
Lymphocytes Relative: 36.4 % (ref 12.0–46.0)
Lymphs Abs: 1.5 10*3/uL (ref 0.7–4.0)
MCHC: 32.9 g/dL (ref 30.0–36.0)
MCV: 95.1 fl (ref 78.0–100.0)
MONOS PCT: 7.8 % (ref 3.0–12.0)
Monocytes Absolute: 0.3 10*3/uL (ref 0.1–1.0)
Neutro Abs: 1.8 10*3/uL (ref 1.4–7.7)
Neutrophils Relative %: 43.8 % (ref 43.0–77.0)
Platelets: 99 10*3/uL — ABNORMAL LOW (ref 150.0–400.0)
RBC: 3.17 Mil/uL — AB (ref 3.87–5.11)
RDW: 14.6 % (ref 11.5–15.5)
WBC: 4 10*3/uL (ref 4.0–10.5)

## 2015-01-08 LAB — C-REACTIVE PROTEIN: CRP: 2.1 mg/dL (ref 0.5–20.0)

## 2015-01-08 LAB — TRIGLYCERIDES: Triglycerides: 208 mg/dL — ABNORMAL HIGH (ref 0.0–149.0)

## 2015-01-08 MED ORDER — CIPROFLOXACIN HCL 500 MG PO TABS: 500.0000 mg | ORAL_TABLET | Freq: Two times a day (BID) | ORAL | Status: DC

## 2015-01-08 MED ORDER — OMEPRAZOLE 40 MG PO CPDR: DELAYED_RELEASE_CAPSULE | ORAL | Status: DC

## 2015-01-08 NOTE — Telephone Encounter (Signed)
rx sent.  I will call her with the results when available

## 2015-01-09 ENCOUNTER — Other Ambulatory Visit: Payer: Self-pay

## 2015-01-09 ENCOUNTER — Ambulatory Visit (HOSPITAL_COMMUNITY)
Admission: RE | Admit: 2015-01-09 | Discharge: 2015-01-09 | Disposition: A | Discharge status: Home / Self Care | Payer: BLUE CROSS/BLUE SHIELD | Source: Ambulatory Visit | Attending: Internal Medicine | Admitting: Internal Medicine

## 2015-01-09 DIAGNOSIS — T80219A Unspecified infection due to central venous catheter, initial encounter: Secondary | ICD-10-CM

## 2015-01-09 DIAGNOSIS — K912 Postsurgical malabsorption, not elsewhere classified: Secondary | ICD-10-CM | POA: Insufficient documentation

## 2015-01-09 DIAGNOSIS — I739 Peripheral vascular disease, unspecified: Secondary | ICD-10-CM | POA: Insufficient documentation

## 2015-01-09 LAB — PREALBUMIN: Prealbumin: 18 mg/dL (ref 17–34)

## 2015-01-09 MED ORDER — LIDOCAINE-EPINEPHRINE 2 %-1:100000 IJ SOLN
INTRAMUSCULAR | Status: AC
  Filled 2015-01-09: qty 1

## 2015-01-09 MED ORDER — HEPARIN SOD (PORK) LOCK FLUSH 100 UNIT/ML IV SOLN
INTRAVENOUS | Status: AC
  Filled 2015-01-09: qty 5

## 2015-01-09 NOTE — Procedures (Signed)
Successful exchange of new left upper extremity approach 43 cm single lumen PICC line with tip at the superior caval-atrial junction.   The PICC line is ready for immediate use.  Ronny Bacon, MD Pager #: [phone]

## 2015-01-09 NOTE — Progress Notes (Signed)
Quick Note:  Please have them order blood cx x 2 and also cx PICC tip ______

## 2015-01-09 NOTE — Progress Notes (Signed)
Quick Note:  Please send to pharmacist ______ 

## 2015-01-11 ENCOUNTER — Other Ambulatory Visit: Payer: Self-pay

## 2015-01-11 DIAGNOSIS — T80219A Unspecified infection due to central venous catheter, initial encounter: Secondary | ICD-10-CM

## 2015-01-11 DIAGNOSIS — R6883 Chills (without fever): Secondary | ICD-10-CM

## 2015-01-11 NOTE — Progress Notes (Signed)
Quick Note:  I cannot see the blood cxs that were supposed to be done - if they have not been done she needs to do them  Is she still having fever/chills? ______

## 2015-01-12 ENCOUNTER — Other Ambulatory Visit: Payer: BLUE CROSS/BLUE SHIELD

## 2015-01-12 DIAGNOSIS — R6883 Chills (without fever): Secondary | ICD-10-CM

## 2015-01-12 DIAGNOSIS — T80219A Unspecified infection due to central venous catheter, initial encounter: Secondary | ICD-10-CM

## 2015-01-12 LAB — CATH TIP CULTURE: Culture: >100

## 2015-01-12 NOTE — Progress Notes (Signed)
Quick Note:  Please tell [name] [name] to make sure to go ahead and take her cipro now for 1 week total in case she is not on it - to treat any residual infection after PICC removed ______

## 2015-01-16 LAB — CULTURE, BLOOD (SINGLE)

## 2015-01-17 ENCOUNTER — Inpatient Hospital Stay (HOSPITAL_COMMUNITY)
Admission: AD | Admit: 2015-01-17 | Discharge: 2015-01-19 | DRG: 315 | Disposition: A | Discharge status: Home Health | Payer: BLUE CROSS/BLUE SHIELD | Source: Ambulatory Visit | Attending: Gastroenterology | Admitting: Gastroenterology

## 2015-01-17 ENCOUNTER — Encounter (HOSPITAL_COMMUNITY): Payer: Self-pay

## 2015-01-17 DIAGNOSIS — K559 Vascular disorder of intestine, unspecified: Secondary | ICD-10-CM | POA: Diagnosis present

## 2015-01-17 DIAGNOSIS — Y828 Other medical devices associated with adverse incidents: Secondary | ICD-10-CM | POA: Diagnosis present

## 2015-01-17 DIAGNOSIS — Z79899 Other long term (current) drug therapy: Secondary | ICD-10-CM

## 2015-01-17 DIAGNOSIS — Z91041 Radiographic dye allergy status: Secondary | ICD-10-CM

## 2015-01-17 DIAGNOSIS — T827XXA Infection and inflammatory reaction due to other cardiac and vascular devices, implants and grafts, initial encounter: Secondary | ICD-10-CM

## 2015-01-17 DIAGNOSIS — K912 Postsurgical malabsorption, not elsewhere classified: Secondary | ICD-10-CM | POA: Diagnosis present

## 2015-01-17 DIAGNOSIS — B9689 Other specified bacterial agents as the cause of diseases classified elsewhere: Secondary | ICD-10-CM | POA: Diagnosis present

## 2015-01-17 DIAGNOSIS — E559 Vitamin D deficiency, unspecified: Secondary | ICD-10-CM | POA: Diagnosis present

## 2015-01-17 DIAGNOSIS — D638 Anemia in other chronic diseases classified elsewhere: Secondary | ICD-10-CM | POA: Diagnosis present

## 2015-01-17 DIAGNOSIS — Z880 Allergy status to penicillin: Secondary | ICD-10-CM

## 2015-01-17 DIAGNOSIS — Z8711 Personal history of peptic ulcer disease: Secondary | ICD-10-CM

## 2015-01-17 DIAGNOSIS — T80211A Bloodstream infection due to central venous catheter, initial encounter: Secondary | ICD-10-CM | POA: Diagnosis present

## 2015-01-17 DIAGNOSIS — T80219A Unspecified infection due to central venous catheter, initial encounter: Secondary | ICD-10-CM

## 2015-01-17 DIAGNOSIS — Z7982 Long term (current) use of aspirin: Secondary | ICD-10-CM

## 2015-01-17 DIAGNOSIS — M810 Age-related osteoporosis without current pathological fracture: Secondary | ICD-10-CM | POA: Diagnosis present

## 2015-01-17 DIAGNOSIS — R7881 Bacteremia: Secondary | ICD-10-CM | POA: Diagnosis not present

## 2015-01-17 DIAGNOSIS — Z833 Family history of diabetes mellitus: Secondary | ICD-10-CM | POA: Diagnosis not present

## 2015-01-17 DIAGNOSIS — D696 Thrombocytopenia, unspecified: Secondary | ICD-10-CM | POA: Diagnosis present

## 2015-01-17 DIAGNOSIS — Z8249 Family history of ischemic heart disease and other diseases of the circulatory system: Secondary | ICD-10-CM

## 2015-01-17 DIAGNOSIS — T827XXD Infection and inflammatory reaction due to other cardiac and vascular devices, implants and grafts, subsequent encounter: Secondary | ICD-10-CM | POA: Diagnosis not present

## 2015-01-17 DIAGNOSIS — Z86010 Personal history of colonic polyps: Secondary | ICD-10-CM

## 2015-01-17 DIAGNOSIS — I420 Dilated cardiomyopathy: Secondary | ICD-10-CM | POA: Diagnosis present

## 2015-01-17 LAB — PROTIME-INR
INR: 1.17 (ref 0.00–1.49)
Prothrombin Time: 15.1 seconds (ref 11.6–15.2)

## 2015-01-17 LAB — COMPREHENSIVE METABOLIC PANEL
ALBUMIN: 3.3 g/dL — AB (ref 3.5–5.0)
ALK PHOS: 76 U/L (ref 38–126)
ALT: 19 U/L (ref 14–54)
AST: 18 U/L (ref 15–41)
Anion gap: 7 (ref 5–15)
BILIRUBIN TOTAL: 0.5 mg/dL (ref 0.3–1.2)
BUN: 21 mg/dL — AB (ref 6–20)
CO2: 22 mmol/L (ref 22–32)
CREATININE: 1.09 mg/dL — AB (ref 0.44–1.00)
Calcium: 8.5 mg/dL — ABNORMAL LOW (ref 8.9–10.3)
Chloride: 109 mmol/L (ref 101–111)
GFR calc Af Amer: >60 mL/min (ref >60)
GFR, EST NON AFRICAN AMERICAN: 52 mL/min — AB (ref >60)
GLUCOSE: 93 mg/dL (ref 65–99)
POTASSIUM: 3.5 mmol/L (ref 3.5–5.1)
Sodium: 138 mmol/L (ref 135–145)
TOTAL PROTEIN: 7.9 g/dL (ref 6.5–8.1)

## 2015-01-17 LAB — CBC
HEMATOCRIT: 27.1 % — AB (ref 36.0–46.0)
Hemoglobin: 8.7 g/dL — ABNORMAL LOW (ref 12.0–15.0)
MCH: 31.4 pg (ref 26.0–34.0)
MCHC: 32.1 g/dL (ref 30.0–36.0)
MCV: 97.8 fL (ref 78.0–100.0)
PLATELETS: 99 10*3/uL — AB (ref 150–400)
RBC: 2.77 MIL/uL — ABNORMAL LOW (ref 3.87–5.11)
RDW: 14.1 % (ref 11.5–15.5)
WBC: 4.3 10*3/uL (ref 4.0–10.5)

## 2015-01-17 MED ORDER — DIPHENOXYLATE-ATROPINE 2.5-0.025 MG PO TABS: 2.0000 | ORAL_TABLET | Freq: Four times a day (QID) | ORAL | Status: DC | PRN

## 2015-01-17 MED ORDER — PRAVASTATIN SODIUM 20 MG PO TABS
20.0000 mg | ORAL_TABLET | Freq: Every day | ORAL | Status: DC
  Administered 2015-01-18: 20 mg via ORAL
  Filled 2015-01-17: qty 1

## 2015-01-17 MED ORDER — DEXTROSE-NACL 5-0.45 % IV SOLN
INTRAVENOUS | Status: DC
  Administered 2015-01-17 – 2015-01-19 (×3): via INTRAVENOUS

## 2015-01-17 MED ORDER — LOPERAMIDE HCL 2 MG PO CAPS: 4.0000 mg | ORAL_CAPSULE | Freq: Three times a day (TID) | ORAL | Status: DC

## 2015-01-17 MED ORDER — CIPROFLOXACIN IN D5W 400 MG/200ML IV SOLN
400.0000 mg | Freq: Two times a day (BID) | INTRAVENOUS | Status: DC
  Administered 2015-01-17 – 2015-01-19 (×4): 400 mg via INTRAVENOUS
  Filled 2015-01-17 (×4): qty 200

## 2015-01-17 MED ORDER — HYDROCODONE-ACETAMINOPHEN 5-325 MG PO TABS: 1.0000 | ORAL_TABLET | ORAL | Status: DC | PRN

## 2015-01-17 MED ORDER — ACETAMINOPHEN 650 MG RE SUPP: 650.0000 mg | Freq: Four times a day (QID) | RECTAL | Status: DC | PRN

## 2015-01-17 MED ORDER — DEXTROSE 5 % IV SOLN: 1.0000 g | INTRAVENOUS | Status: DC

## 2015-01-17 MED ORDER — PANTOPRAZOLE SODIUM 40 MG PO TBEC
40.0000 mg | DELAYED_RELEASE_TABLET | Freq: Every day | ORAL | Status: DC
  Administered 2015-01-18 – 2015-01-19 (×2): 40 mg via ORAL
  Filled 2015-01-17 (×2): qty 1

## 2015-01-17 MED ORDER — ASPIRIN 81 MG PO CHEW
81.0000 mg | CHEWABLE_TABLET | Freq: Every day | ORAL | Status: DC
  Administered 2015-01-18 – 2015-01-19 (×2): 81 mg via ORAL
  Filled 2015-01-17 (×2): qty 1

## 2015-01-17 MED ORDER — ONDANSETRON HCL 4 MG/2ML IJ SOLN: 4.0000 mg | Freq: Four times a day (QID) | INTRAMUSCULAR | Status: DC | PRN

## 2015-01-17 MED ORDER — HYDROMORPHONE HCL 1 MG/ML IJ SOLN: 0.5000 mg | INTRAMUSCULAR | Status: DC | PRN

## 2015-01-17 MED ORDER — ONDANSETRON HCL 4 MG PO TABS: 4.0000 mg | ORAL_TABLET | Freq: Four times a day (QID) | ORAL | Status: DC | PRN

## 2015-01-17 MED ORDER — ADULT MULTIVITAMIN W/MINERALS CH
1.0000 | ORAL_TABLET | Freq: Every day | ORAL | Status: DC
  Administered 2015-01-18 – 2015-01-19 (×2): 1 via ORAL
  Filled 2015-01-17 (×3): qty 1

## 2015-01-17 MED ORDER — ENOXAPARIN SODIUM 40 MG/0.4ML ~~LOC~~ SOLN
40.0000 mg | SUBCUTANEOUS | Status: DC
  Filled 2015-01-17 (×2): qty 0.4

## 2015-01-17 MED ORDER — DIPHENHYDRAMINE HCL 25 MG PO CAPS: 25.0000 mg | ORAL_CAPSULE | Freq: Every evening | ORAL | Status: DC | PRN

## 2015-01-17 MED ORDER — DIPHENOXYLATE-ATROPINE 2.5-0.025 MG PO TABS: 2.0000 | ORAL_TABLET | Freq: Three times a day (TID) | ORAL | Status: DC

## 2015-01-17 MED ORDER — ACETAMINOPHEN 325 MG PO TABS: 650.0000 mg | ORAL_TABLET | Freq: Four times a day (QID) | ORAL | Status: DC | PRN

## 2015-01-17 MED ORDER — GLUCERNA SHAKE PO LIQD
237.0000 mL | Freq: Three times a day (TID) | ORAL | Status: DC
  Administered 2015-01-17 – 2015-01-19 (×4): 237 mL via ORAL
  Filled 2015-01-17 (×8): qty 237

## 2015-01-17 MED ORDER — LOPERAMIDE HCL 2 MG PO CAPS: 4.0000 mg | ORAL_CAPSULE | ORAL | Status: DC | PRN

## 2015-01-17 NOTE — H&P (Signed)
Admission Note  Primary Care Physician:  Annye Asa, MD Primary Gastroenterologist:  Dr. Silvano Rusk  MD  HPI: [name] [name] is a 64 y.o. female known to Dr. 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 She has been maintained on home TPN, with PICC line. Most recently she has been requiring TPN this 3 nights per week, and has been eating well. She has had issues with bacterial overgrowth and chronic diarrhea requires  Imodium and Lomotil daily periodically. Also has an anemia of chronic disease, osteoporosis and thrombocytopenia and is followed currently by Dr. Payton Mccallum for hematology. Also with history of a dilated cardiomyopathy and EF of 45%. Patient had called last week with  Chills and sweats onset 01/08/2015, her PICC line was exchanged on 01/09/2015 and catheter tip sent for culture. This has returned positive for Serratia marcescens. She was started empirically on Cipro 01/12/2015 while awaiting final ID on cultures. She has been feeling okay and has not had any documented fever, and no further chills or sweats over the past few days but because of positive blood culture was felt that she should be treated with IV antibiotics and have the new PICC  removed. She is admitted for IV antibiotics and will require reinsertion of new PICC line in 3-5 days.   Past Medical History  Diagnosis Date  . 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 Abnormal LFTs   . Vitamin D deficiency   . Personal history of colonic polyps   . Thrombophilia   . Diabetes mellitus     resolved after weight loss  . Obesity     prior to weight loss  . Allergic rhinosinusitis   . At risk for dental problems   . Fracture of left clavicle   . Osteoporosis   . Anemia of chronic disease   . Renal insufficiency   . Atypical nevus   . Pathologic fracture of neck of femur   . Pancytopenia 10/07/2011  . Small bowel ischemia   .  Bacterial overgrowth syndrome   . Splenomegaly     By ultrasound  . 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 Carotid stenosis     Carotid US (9/15):  R 40-59%; L 1-39% >> FU 1 year  . Hx of cardiovascular stress test     Myoview (9/15):  inf-apical scar; no ischemia; EF 47% - low risk   . 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 12/12/2011    Past Surgical History  Procedure Laterality Date  . Small intestine surgery  2005    multiple with right colon resection for ischemia/infarct  . Cholecystectomy    . Orif proximal femoral fracture w/ itst nail system  03/2007    left, Dr. Shellia Carwin  . Esophagogastroduodenoscopy  01/22/2009    erosive esophagitis  . Colonoscopy  12/05/2005    internal hemorrhoids (for polyp surveillance)  . Colonoscopy  04/26/2012    Procedure: COLONOSCOPY;  Surgeon: Gatha Mayer, MD;  Location: [facility];  Service: Endoscopy;  Laterality: N/A;    Prior to Admission medications   Medication Sig Start Date End Date Taking? Authorizing Provider  ADULT TPN Inject 2,100 mLs into the vein continuous. Infuse 2179ml over 10 hours with 1 hour ramp up and 1 hour ramp down. Total bag per day provides: 250g Dextrose 70%, 100g Travasol 10%, 45g Intralipids 30%, various electrolytes, MVI, TE, Zinc, selenium.  Historical Provider, MD  aspirin EC 81 MG tablet Take 1 tablet (81 mg total) by mouth daily. 11/17/13   Liliane Shi, PA-C  ciprofloxacin (CIPRO) 500 MG tablet Take 1 tablet (500 mg total) by mouth 2 (two) times daily. 01/08/15   Gatha Mayer, MD  clobetasol ointment (TEMOVATE) 0.35 % Apply 1 application topically 2 (two) times daily. 03/21/14   Gatha Mayer, MD  diphenhydrAMINE (BENADRYL) 25 mg capsule Take 25 mg by mouth every 6 (six) hours as needed. For itching/sleep.    Historical Provider, MD  diphenoxylate-atropine (LOMOTIL) 2.5-0.025 MG per tablet Take 2 tablets by mouth 1/2 hour before meals. 07/21/13   Gatha Mayer, MD  loperamide (IMODIUM A-D) 2 MG tablet Take 2  tablets 30 minutes prior to each meal 08/09/12   Gatha Mayer, MD  Multiple Vitamin (MULTIVITAMIN WITH MINERALS) TABS Take 1 tablet by mouth daily.    Historical Provider, MD  mupirocin ointment (BACTROBAN) 2 % Apply 1 application topically 2 (two) times daily. Apply to lt forearm area bid 12/28/13   Mackie Pai, PA-C  omeprazole (PRILOSEC) 40 MG capsule TAKE ONE CAPSULE BY MOUTH TWICE DAILY AT 10 AM AND 5 PM 01/08/15   Gatha Mayer, MD  ondansetron (ZOFRAN) 4 MG tablet Take 1 tablet (4 mg total) by mouth every 8 (eight) hours as needed for nausea or vomiting. 05/30/14   [name], MD  pravastatin (PRAVACHOL) 20 MG tablet Take 1 tablet (20 mg total) by mouth every evening. 11/24/13   Scott T Weaver, PA-C  Teduglutide, rDNA, (GATTEX) 5 MG KIT Inject 3.3 Units into the skin daily. 09/11/14   Gatha Mayer, MD    Current Facility-Administered Medications  Medication Dose Route Frequency Provider Last Rate Last Dose  . acetaminophen (TYLENOL) tablet 650 mg  650 mg Oral Q6H PRN [name] S Esterwood, PA-C       Or  . acetaminophen (TYLENOL) suppository 650 mg  650 mg Rectal Q6H PRN [name] S Esterwood, PA-C      . ciprofloxacin (CIPRO) IVPB 400 mg  400 mg Intravenous Q12H [name] S Esterwood, PA-C      . dextrose 5 %-0.45 % sodium chloride infusion   Intravenous Continuous [name] S Esterwood, PA-C      . diphenhydrAMINE (BENADRYL) capsule 25 mg  25 mg Oral QHS PRN [name] S Esterwood, PA-C      . diphenoxylate-atropine (LOMOTIL) 2.5-0.025 MG per tablet 2 tablet  2 tablet Oral TID AC [name] S Esterwood, PA-C      . enoxaparin (LOVENOX) injection 40 mg  40 mg Subcutaneous Q24H [name] S Esterwood, PA-C      . HYDROcodone-acetaminophen (NORCO/VICODIN) 5-325 MG per tablet 1-2 tablet  1-2 tablet Oral Q4H PRN [name] S Esterwood, PA-C      . HYDROmorphone (DILAUDID) injection 0.5 mg  0.5 mg Intravenous Q4H PRN [name] S Esterwood, PA-C      . loperamide (IMODIUM) capsule 4 mg  4 mg Oral TID AC [name] S Esterwood, PA-C      .  multivitamin with minerals tablet 1 tablet  1 tablet Oral Daily [name] S Esterwood, PA-C      . ondansetron (ZOFRAN) tablet 4 mg  4 mg Oral Q6H PRN [name] S Esterwood, PA-C       Or  . ondansetron (ZOFRAN) injection 4 mg  4 mg Intravenous Q6H PRN [name] S Esterwood, PA-C      . [START ON 01/18/2015] pantoprazole (PROTONIX) EC tablet 40 mg  40 mg Oral Q0600 [name] S Esterwood, PA-C        Allergies as of 01/17/2015 - Review Complete 01/09/2015  Allergen Reaction Noted  . Iohexol Itching 11/14/2010  . Penicillins Itching 01/19/2008    Family History  Problem Relation Age of Onset  . Diabetes Mother   . Hypertension Mother   . Colon cancer Neg Hx   . Stomach cancer Neg Hx     Social History   Social History  . Marital Status: Married    Spouse Name: N/A  . Number of Children: N/A  . Years of Education: N/A   Occupational History  . Not on file.   Social History Main Topics  . Smoking status: Former Smoker    Types: Cigarettes    Quit date: 05/12/2004  . Smokeless tobacco: Never Used  . Alcohol Use: No  . Drug Use: No  . Sexual Activity: Not Currently   Other Topics Concern  . Not on file   Social History Narrative    Review of Systems:  All systems reviewed an negative except where noted in HPI.   Physical Exam: Vital signs in last 24 hours: Temp:  [98.1 F (36.7 C)] 98.1 F (36.7 C) (09/07 1300) Pulse Rate:  [63] 63 (09/07 1300) Resp:  [16] 16 (09/07 1300) BP: (140)/(74) 140/74 mmHg (09/07 1300) SpO2:  [100 %] 100 % (09/07 1300) Weight:  [139 lb 12.8 oz (63.413 kg)] 139 lb 12.8 oz (63.413 kg) (09/07 1300)   General:  Pleasant, well-developed, white female in NAD Head:  Normocephalic and atraumatic. Eyes:  Sclera clear, no icterus.   Conjunctiva pink. Ears:  Normal auditory acuity. Mouth:  No deformity or lesions.  Neck:  Supple; no masses . Lungs:  Clear throughout to auscultation.   No wheezes, crackles, or rhonchi. No acute distress. Heart:  Regular rate and  rhythm; no murmurs. Abdomen:  Soft, nondistended, nontender. No masses, hepatomegaly. No obvious masses.  Normal bowel sounds , incisional scars.    Rectal:  Not done   Msk:  Symmetrical without gross deformities.. Pulses:  Normal pulses noted. Extremities:  Without edema. Neurologic:  Alert and  oriented x4;  grossly normal neurologically. Skin:  Intact without significant lesions or rashes. Cervical Nodes:  No significant cervical adenopathy. Psych:  Alert and cooperative. Normal mood and affect.  Lab Results: No results for input(s): WBC, HGB, HCT, PLT in the last 72 hours. BMET No results for input(s): NA, K, CL, CO2, GLUCOSE, BUN, CREATININE, CALCIUM in the last 72 hours. LFT No results for input(s): PROT, ALBUMIN, AST, ALT, ALKPHOS, BILITOT, BILIDIR, IBILI in the last 72 hours. PT/INR No results for input(s): LABPROT, INR in the last 72 hours. Hepatitis Panel No results for input(s): HEPBSAG, HCVAB, HEPAIGM, HEPBIGM in the last 72 hours.  Studies/Results: No results found.  Impression / Plan:  #1 64 yo female with short bowel syndrome , and hx of ischemic bowel who is maintained  On home TPN and uses PICC line. Pt with bacteremia with + BC for Serratia  from 8/30 when PICC was removed and new line placed  Symptomatic only with chills and sweats before old line removed  #2 GERD #3 anemia of chronic disease #4 CHF- ef 45% #5 osteoporosis  Plan; Pt admitted for IV antibiotics- will use Cipro as PCN allergy- 10 day course  total D/C PICC line today then plan to insert new line in 3-5 days Leave off TPN while in hospital- she uses 4 nights per  week for 10 hours , no difficulty taking po's.  Would anticipate 3-5 day stay for IV abx , and replacement of PICC prior to discharge.       LOS: 0 days   [name]  01/17/2015, 1:30 PM  ________________________________________________________________________  Velora Heckler GI MD note:  I personally examined the patient,  reviewed the data and agree with the assessment and plan described above.  She knows to eat as well as possible, says that she occasionally will supplement with glucerna at home. Will order glucerna TID until new PICC is placed.   Owens Loffler, MD [facility] Pager [phone]

## 2015-01-17 NOTE — Progress Notes (Signed)
Quick Note:  [name] [name], Tell Ladona she has bacteria in blood.  I checked with ID and this is what we/she needs to do to prevent further problems like heart valve infections, etc  1) PICC out today and reinsert Monday unless it could be put back in Sat 2) Peripheral IV w/ ceftriaxone 1 g every 24 hrs x 10 days 3) 1L D5 NS daily also - IV over 2 hrs - while off TPN 4) 2 sets peripheral blood cxs drawn on 9/16 5) I guess she is feeling ok but ask re: sxs, etc and have her see me or an APP this week or next week to make sure she is - need to know any fever, other problems, etc 6) can stop oral cipro ______

## 2015-01-18 ENCOUNTER — Encounter: Payer: Self-pay | Admitting: Internal Medicine

## 2015-01-18 DIAGNOSIS — T827XXD Infection and inflammatory reaction due to other cardiac and vascular devices, implants and grafts, subsequent encounter: Secondary | ICD-10-CM

## 2015-01-18 DIAGNOSIS — A488 Other specified bacterial diseases: Secondary | ICD-10-CM

## 2015-01-18 HISTORY — DX: Other specified bacterial diseases: A48.8

## 2015-01-18 MED ORDER — DARBEPOETIN ALFA 300 MCG/0.6ML IJ SOSY
300.0000 ug | PREFILLED_SYRINGE | Freq: Once | INTRAMUSCULAR | Status: AC
  Administered 2015-01-18: 300 ug via SUBCUTANEOUS
  Filled 2015-01-18: qty 0.6

## 2015-01-18 NOTE — Progress Notes (Signed)
Brief Pharmacy note: Darbepoetin per Pharmacy  Last 300 mcg Darbepoetin dose 09/07/14 per Heme/Onc  Hgb today 8.7, would hold if Hgb < 10  Will give Darbepoetin 300 mcg x1 today, no need to follow patient, will d/c Protocol,   Thank you,   Minda Ditto PharmD Pager [phone] 01/18/2015, 10:11 AM

## 2015-01-18 NOTE — Progress Notes (Signed)
Patient ID: [name] [name], female   DOB: August 03, 1950, 64 y.o.   MRN: [id number]    Progress Note   Subjective  Feels fine, no specific complaints. HGB 8.7 on admit- she gets Aranesp  periodically per Hematology   Objective   Vital signs in last 24 hours: Temp:  [98.1 F (36.7 C)-98.5 F (36.9 C)] 98.5 F (36.9 C) (09/08 0535) Pulse Rate:  [50-63] 55 (09/08 0535) Resp:  [16-18] 18 (09/08 0535) BP: (111-140)/(50-74) 128/65 mmHg (09/08 0535) SpO2:  [100 %] 100 % (09/08 0535) Weight:  [139 lb 12.8 oz (63.413 kg)] 139 lb 12.8 oz (63.413 kg) (09/07 1300) Last BM Date: 01/17/15 General:    white female in NAD Heart:  Regular rate and rhythm; no murmurs Lungs: Respirations even and unlabored, lungs CTA bilaterally Abdomen:  Soft, nontender and nondistended. Normal bowel sounds. Extremities:  Without edema. Neurologic:  Alert and oriented,  grossly normal neurologically. Psych:  Cooperative. Normal mood and affect.  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,now with PICC line infection/bacteremia- Serratia On IV Cipro-plan is for 10 day course- will ask case manager to involve [facility]  And see if can do BID dosing at home- if not will ask ID for alternative Replace PICC likely prior to D/C so will have access #2 anemia of chronic disease- hgb usually 10 range and gets Aranesp when drifts- has not  had for several months -will order while here  Active Problems:   Bacteremia associated with intravascular line     LOS: 1 day   [name]  01/18/2015, 9:09 AM  ________________________________________________________________________  Velora Heckler GI MD note:  I personally examined the patient, reviewed the data and agree with the assessment and plan described above.  Will find out about twice daily IV cipro at home (case manager), also will enquire about new PICC being placed (saturday will be 72 hours) so that she can go home Saturday afternoon if possible. She is understandably eager to go home when possible.   Owens Loffler, MD [facility] Pager [phone]

## 2015-01-19 MED ORDER — CIPROFLOXACIN IN D5W 400 MG/200ML IV SOLN: 400.0000 mg | Freq: Two times a day (BID) | INTRAVENOUS | Status: AC

## 2015-01-19 NOTE — Progress Notes (Signed)
Patient ID: [name] [name], female   DOB: Matricia 04, 1952, 64 y.o.   MRN: [id number]    Progress Note   Subjective   Doing well. No complaints or problems. Awaiting coordination of Home health  for IV BID Cipro.   Objective   Vital signs in last 24 hours: Temp:  [97.8 F (36.6 C)-98.2 F (36.8 C)] 97.9 F (36.6 C) (09/09 9983) Pulse Rate:  [49-54] 52 (09/09 0613) Resp:  [16] 16 (09/09 0613) BP: (118-137)/(50-61) 131/54 mmHg (09/09 0613) SpO2:  [100 %] 100 % (09/09 3825) Last BM Date: 01/18/15 General:    white female in NAD, husband at bedside Heart:  Regular rate and rhythm; no murmurs Lungs: Respirations even and unlabored, lungs CTA bilaterally Abdomen:  Soft, nontender and nondistended. Normal bowel sounds. Extremities:  Without edema.PICC site benign Neurologic:  Alert and oriented,  grossly normal neurologically. Psych:  Cooperative. Normal mood and affect.  Intake/Output from previous day: 09/08 0701 - 09/09 0700 In: 3570.8 [P.O.:960; I.V.:2010.8; IV Piggyback:600] Out: -  Intake/Output this shift:    Lab Results:  Recent Labs  01/17/15 1435  WBC 4.3  HGB 8.7*  HCT 27.1*  PLT 99*   BMET  Recent Labs  01/17/15 1435  NA 138  K 3.5  CL 109  CO2 22  GLUCOSE 93  BUN 21*  CREATININE 1.09*  CALCIUM 8.5*   LFT  Recent Labs  01/17/15 1435  PROT 7.9  ALBUMIN 3.3*  AST 18  ALT 19  ALKPHOS 76  BILITOT 0.5   PT/INR  Recent Labs  01/17/15 1435  LABPROT 15.1  INR 1.17       Assessment / Plan:    #1 64 yo female with acquired short bowel syndrome on chronic home TNA, now with PICC line infection/bacteremia.Original PICC removed 8/30 and replaced- new PICC removed 9/7   Plan to discharge home today if possible- leave peripheral IV and continue IV Cipro 400 mg BID x 10 days total (started 9/7) Will schedule for PICC line replacement with IR on Monday 01/22/2015   Active Problems:   Bacteremia associated with intravascular line     LOS: 2  days   [name] [name]  01/19/2015, 9:22 AM  ________________________________________________________________________  [name] [name] GI MD note:  I personally examined the patient, reviewed the data and agree with the assessment and plan described above.  Safe for d/c today.  IV cipro at home, via peripheral IV. Will have new PICC placed on Monday. THat appt is already set up.   [name] Loffler, MD [facility] Gastroenterology Pager [phone]

## 2015-01-19 NOTE — Discharge Instructions (Signed)
You are scheduled for replacement of PICC line on Monday 01/22/2015  At 3 pm in [facility] radiology. Continue IV Cipro 400 mg IV twice daily through 9/17 /2016.   Call Dr Marla Roe office to make routine follow up appt  For October-we will want to change out PICC more frequently

## 2015-01-19 NOTE — Care Management Note (Signed)
Case Management Note  Patient Details  Name: [name] [name] MRN: [id number] Date of Birth: [birth year]  Subjective/Objective:        64 yo admitted with Bacteremia. Hx of Short Gut Syndrome            Action/Plan: From home with husband  Expected Discharge Date:   (unknown)               Expected Discharge Plan:  [place]  In-House Referral:     Discharge planning Services  CM Consult  Post Acute Care Choice:  Home Health Choice offered to:  Patient  DME Arranged:    DME Agency:     HH Arranged:  RN Scissors Agency:   ([place])  Status of Service:  Completed, signed off  Medicare Important Message Given:    Date Medicare IM Given:    Medicare IM give by:    Date Additional Medicare IM Given:    Additional Medicare Important Message give by:     If discussed at [place] of Stay Meetings, dates discussed:    Additional Comments: Pt from home with husband and has history of short gut syndrome. Pt has been on home TNA for 10 years. Per pt TNA has been provided to her by [facility]) for the last 3 years and she has self administered her own TNA since she has used them. Pt needing to go home on IV Cipro for a total of 10 days of treatment. [facility] will provide Cipro to pt and [facility] will render [facility] for administration of IV Cipro. [facility] is contracted with [facility] and pt agrees to use them for [facility] services. Pt to DC home with peripheral IV and is to be scheduled to have new picc line placed on Monday 9/12. TNA to be held until new Picc line placed. All information given to PA and RN. Pt appreciative of CM assistance. Lynnell Catalan, RN 01/19/2015, 2:17 PM

## 2015-01-19 NOTE — Discharge Summary (Signed)
[facility] Discharge Summary  Name: [name] [name] MRN: [id number] DOB: 02-27-1951 64 y.o. PCP:  Annye Asa, MD  Date of Admission: 01/17/2015 12:49 PM Date of Discharge: 01/19/2015 Attending Physician: Milus Banister, MD  Discharge Diagnosis: Active Problems:   Bacteremia associated with intravascular line Acquired short bowel syndrome  Consultations:  none  Procedures Performed:  Ir Fluoro Guide Cv Line Left  01/09/2015   INDICATION: Patient with history of acquired short gut syndrome requiring long-term intravenous access for TPN administration. Patient has had the existing left upper extremity approach PICC line in place since 11/20 though recently has noticed the development of chills during the TPN infusion. As such, request made for fluoroscopic guided PICC line catheter exchange.  EXAM: FLUOROSCOPIC GUIDED PICC LINE EXCHANGE  MEDICATIONS: Left upper extremity approach PICC line placement - 03/13/2014  CONTRAST:  None  FLUOROSCOPY TIME:  48 seconds (4 mg)  COMPLICATIONS: None immediate  TECHNIQUE: The procedure, risks, benefits, and alternatives were explained to the patient and informed written consent was obtained. The left upper extremity and external portion of the existing PICC line was prepped with chlorhexidine in a sterile fashion, and a sterile drape was applied covering the operative field. Maximum barrier sterile technique with sterile gowns and gloves were used for the procedure. A timeout was performed prior to the initiation of the procedure. Local anesthesia was provided with 1% lidocaine.  The existing PICC line was cannulated with an 0.0018 wire which was advanced through the catheter. The catheter was exchanged for a peel-away sheath, ultimately allowing advancement of a 43-cm, 5-French, single lumen PICC line to the level of the superior caval atrial junction. A post procedure spot fluoroscopic image was obtained. The catheter easily aspirated and  flushed. A dressing was placed. The patient tolerated the procedure well without immediate post procedural complication.  FINDINGS: After catheter exchange, the tip lies within the superior cavoatrial junction. The catheter aspirates and flushes normally and is ready for immediate use.  IMPRESSION: Successful fluoroscopic guided exchange of left upper extremity approach 43 cm, 5 - [place], single lumen PICC with tip overlying the superior caval atrial junction. The PICC line is ready for immediate use.   Electronically Signed   By: Sandi Mariscal M.D.   On: 01/09/2015 16:06    GI Procedures: none  History/Physical Exam:  See Admission H&P  Admission HPI: [name] [name] was admitted from home on 01/17/2015 after finding of positive blood cultures from the catheter tip culture on 01/09/2015. Prior to that she had an episode of chills and sweats. It was opted to discontinue and culture her old PICC line and a new PICC line was placed. She was started on oral Cipro at home but after cultures returned positive for Serratia decision was made that she should be admitted for IV antibiotics, and removal of PICC line for at least 72 hours. Patient had a benign hospital course -PICC was removed on 01/17/2015. She was started on IV Cipro 400 mg by mouth twice a day. She is discharged home today with propofol IV to continue IV Cipro 400 mg by mouth twice a day for a total of 10 days. Her course will be completed on 01/27/2015. She is scheduled for replacement of PICC line on Monday, 01/22/2015 at 3 PM in radiology of [facility]. She will resume home TNA had previous rate on previous formula after PICC has been replaced. We'll have her see Dr. Carlean Purl in the office in about 1 month and going forward plan  to change out her PICC line on a more regular basis.Marland Kitchen  Hospital Course by problem list: Active Problems:   Bacteremia associated with intravascular line  acquired short bowel syndrome  Discharge Vitals:  BP 147/68 mmHg   Pulse 61  Temp(Src) 98.5 F (36.9 C) (Oral)  Resp 16  Ht $R'5\' 8"'Bt$  (1.727 m)  Wt 139 lb 12.8 oz (63.413 kg)  BMI 21.26 kg/m2  SpO2 100%  LMP 02/26/2014  Discharge Labs: No results found for this or any previous visit (from the past 24 hour(s)).  Disposition and follow-up:   [name] was discharged from [facility] in stable condition.    Follow-up Appointments: Patient will make a follow-up appointment with Dr. Carlean Purl   Discharge Medications:   Medication List    STOP taking these medications        ciprofloxacin 500 MG tablet  Commonly known as:  CIPRO      TAKE these medications        ADULT TPN  Home TPN from [facility].     aspirin EC 81 MG tablet  Take 1 tablet (81 mg total) by mouth daily.     BENADRYL 25 mg capsule  Generic drug:  diphenhydrAMINE  Take 25 mg by mouth every 6 (six) hours as needed. For itching/sleep.     ciprofloxacin 400 MG/200ML Soln  Commonly known as:  CIPRO  Inject 200 mLs (400 mg total) into the vein 2 (two) times daily.     clobetasol ointment 0.05 %  Commonly known as:  TEMOVATE  Apply 1 application topically 2 (two) times daily.     diphenoxylate-atropine 2.5-0.025 MG per tablet  Commonly known as:  LOMOTIL  Take 2 tablets by mouth 1/2 hour before meals.     loperamide 2 MG tablet  Commonly known as:  IMODIUM A-D  Take 2 tablets 30 minutes prior to each meal     multivitamin with minerals Tabs tablet  Take 1 tablet by mouth daily.     mupirocin ointment 2 %  Commonly known as:  BACTROBAN  Apply 1 application topically 2 (two) times daily. Apply to lt forearm area bid     omeprazole 40 MG capsule  Commonly known as:  PRILOSEC  TAKE ONE CAPSULE BY MOUTH TWICE DAILY AT 10 AM AND 5 PM     ondansetron 4 MG tablet  Commonly known as:  ZOFRAN  Take 1 tablet (4 mg total) by mouth every 8 (eight) hours as needed for nausea or vomiting.     pravastatin 20 MG tablet  Commonly known as:  PRAVACHOL  Take 1  tablet (20 mg total) by mouth every evening.     Teduglutide (rDNA) 5 MG Kit  Commonly known as:  GATTEX  Inject 3.3 Units into the skin daily.     VITAMIN D-3 PO  Take by mouth daily.     VITAMIN E PO  Take by mouth daily.        Signed: Nicoletta Ba 01/19/2015, 4:10 PM

## 2015-01-22 ENCOUNTER — Other Ambulatory Visit (HOSPITAL_COMMUNITY): Payer: BLUE CROSS/BLUE SHIELD

## 2015-01-22 ENCOUNTER — Other Ambulatory Visit (HOSPITAL_COMMUNITY): Payer: Self-pay | Admitting: Radiology

## 2015-01-22 ENCOUNTER — Ambulatory Visit (HOSPITAL_COMMUNITY)
Admission: RE | Admit: 2015-01-22 | Discharge: 2015-01-22 | Disposition: A | Discharge status: Home / Self Care | Payer: BLUE CROSS/BLUE SHIELD | Source: Ambulatory Visit | Attending: Radiology | Admitting: Radiology

## 2015-01-22 DIAGNOSIS — K912 Postsurgical malabsorption, not elsewhere classified: Secondary | ICD-10-CM | POA: Insufficient documentation

## 2015-01-22 MED ORDER — HEPARIN SOD (PORK) LOCK FLUSH 100 UNIT/ML IV SOLN
500.0000 [IU] | Freq: Once | INTRAVENOUS | Status: AC
  Administered 2015-01-22: 500 [IU]

## 2015-01-22 MED ORDER — HEPARIN SOD (PORK) LOCK FLUSH 100 UNIT/ML IV SOLN
INTRAVENOUS | Status: AC
  Administered 2015-01-22: 500 [IU]
  Filled 2015-01-22: qty 5

## 2015-01-22 MED ORDER — LIDOCAINE HCL 1 % IJ SOLN
INTRAMUSCULAR | Status: AC
  Filled 2015-01-22: qty 20

## 2015-01-22 NOTE — Procedures (Signed)
Successful LT IJ TUNNELED SL POWER PICC TIP SVC/RA NO COMP STABLE READY FOR USE

## 2015-01-29 ENCOUNTER — Telehealth: Payer: Self-pay | Admitting: Internal Medicine

## 2015-01-29 ENCOUNTER — Other Ambulatory Visit (INDEPENDENT_AMBULATORY_CARE_PROVIDER_SITE_OTHER): Payer: BLUE CROSS/BLUE SHIELD

## 2015-01-29 DIAGNOSIS — K912 Postsurgical malabsorption, not elsewhere classified: Secondary | ICD-10-CM | POA: Diagnosis not present

## 2015-01-29 DIAGNOSIS — A4153 Sepsis due to Serratia: Secondary | ICD-10-CM

## 2015-01-29 LAB — CBC WITH DIFFERENTIAL/PLATELET
BASOS ABS: 0 10*3/uL (ref 0.0–0.1)
Basophils Relative: 0.7 % (ref 0.0–3.0)
EOS PCT: 13.2 % — AB (ref 0.0–5.0)
Eosinophils Absolute: 0.5 10*3/uL (ref 0.0–0.7)
HCT: 32.3 % — ABNORMAL LOW (ref 36.0–46.0)
HEMOGLOBIN: 10.6 g/dL — AB (ref 12.0–15.0)
Lymphocytes Relative: 32.7 % (ref 12.0–46.0)
Lymphs Abs: 1.3 10*3/uL (ref 0.7–4.0)
MCHC: 32.7 g/dL (ref 30.0–36.0)
MCV: 96 fl (ref 78.0–100.0)
MONO ABS: 0.3 10*3/uL (ref 0.1–1.0)
MONOS PCT: 6.2 % (ref 3.0–12.0)
NEUTROS PCT: 47.2 % (ref 43.0–77.0)
Neutro Abs: 1.9 10*3/uL (ref 1.4–7.7)
Platelets: 116 10*3/uL — ABNORMAL LOW (ref 150.0–400.0)
RBC: 3.37 Mil/uL — AB (ref 3.87–5.11)
RDW: 14.5 % (ref 11.5–15.5)
WBC: 4.1 10*3/uL (ref 4.0–10.5)

## 2015-01-29 LAB — C-REACTIVE PROTEIN: CRP: 0.2 mg/dL — AB (ref 0.5–20.0)

## 2015-01-29 LAB — COMPREHENSIVE METABOLIC PANEL
ALT: 16 U/L (ref 0–35)
AST: 19 U/L (ref 0–37)
Albumin: 3.6 g/dL (ref 3.5–5.2)
Alkaline Phosphatase: 76 U/L (ref 39–117)
BILIRUBIN TOTAL: 0.4 mg/dL (ref 0.2–1.2)
BUN: 17 mg/dL (ref 6–23)
CO2: 25 meq/L (ref 19–32)
CREATININE: 1 mg/dL (ref 0.40–1.20)
Calcium: 8.9 mg/dL (ref 8.4–10.5)
Chloride: 107 mEq/L (ref 96–112)
GFR: 59.19 mL/min — AB (ref >60.00)
GLUCOSE: 120 mg/dL — AB (ref 70–99)
Potassium: 3.8 mEq/L (ref 3.5–5.1)
SODIUM: 139 meq/L (ref 135–145)
TOTAL PROTEIN: 8.2 g/dL (ref 6.0–8.3)

## 2015-01-29 LAB — TRIGLYCERIDES: Triglycerides: 179 mg/dL — ABNORMAL HIGH (ref 0.0–149.0)

## 2015-01-29 LAB — PHOSPHORUS: Phosphorus: 4.1 mg/dL (ref 2.3–4.6)

## 2015-01-29 LAB — MAGNESIUM: MAGNESIUM: 2 mg/dL (ref 1.5–2.5)

## 2015-01-29 LAB — BILIRUBIN, DIRECT: BILIRUBIN DIRECT: 0.1 mg/dL (ref 0.0–0.3)

## 2015-01-29 NOTE — Telephone Encounter (Signed)
Patient left a voicemail that she already had labs done.

## 2015-01-29 NOTE — Telephone Encounter (Signed)
Please do 2 peripheral blood cx re serattia sepsis

## 2015-01-29 NOTE — Telephone Encounter (Signed)
Does she need post antibiotic cultures?

## 2015-01-29 NOTE — Telephone Encounter (Signed)
Orders entered Left message for patient to call back

## 2015-01-31 LAB — PREALBUMIN

## 2015-01-31 NOTE — Progress Notes (Signed)
Quick Note:  Forward to TPN pharmacist ______

## 2015-02-02 ENCOUNTER — Ambulatory Visit (INDEPENDENT_AMBULATORY_CARE_PROVIDER_SITE_OTHER): Payer: BLUE CROSS/BLUE SHIELD | Admitting: Family Medicine

## 2015-02-02 ENCOUNTER — Encounter: Payer: Self-pay | Admitting: Family Medicine

## 2015-02-02 VITALS — BP 113/56 | HR 61 | Temp 98.0°F | Resp 16 | Ht 68.0 in | Wt 142.0 lb

## 2015-02-02 DIAGNOSIS — J156 Pneumonia due to other aerobic Gram-negative bacteria: Secondary | ICD-10-CM | POA: Diagnosis not present

## 2015-02-02 DIAGNOSIS — A488 Other specified bacterial diseases: Secondary | ICD-10-CM

## 2015-02-02 DIAGNOSIS — Z23 Encounter for immunization: Secondary | ICD-10-CM

## 2015-02-02 LAB — PREALBUMIN: PREALBUMIN: 23 mg/dL (ref 17–34)

## 2015-02-02 MED ORDER — ONDANSETRON HCL 4 MG PO TABS: 4.0000 mg | ORAL_TABLET | Freq: Three times a day (TID) | ORAL | Status: DC | PRN

## 2015-02-02 NOTE — Progress Notes (Signed)
Pre visit review using our clinic review tool, if applicable. No additional management support is needed unless otherwise documented below in the visit note. 

## 2015-02-02 NOTE — Progress Notes (Signed)
Quick Note:  Forward prealbumin to [facility] ______

## 2015-02-02 NOTE — Progress Notes (Signed)
   Subjective:    Patient ID: [name] [name], female    DOB: [birth year], 64 y.o.   MRN: [id number]  [facility] f/u- pt was admitted on 9/7 after blood cultures showed bacteremia.  Discharged on 9/9 after PICC was removed- discharge summary reviewed.  Plan was for 10 days IV Cipro.  Had PICC line replaced on 9/12 by IR.  Pt reports no redness around PICC site, no drainage.  Pt reports feeling well since hospital d/c and completion of abx course.  'i feel good'.   Review of Systems For ROS see HPI     Objective:   Physical Exam  Constitutional: She is oriented to person, place, and time. She appears well-developed and well-nourished. No distress.  HENT:  Head: Normocephalic and atraumatic.  Neurological: She is alert and oriented to person, place, and time.  Skin: Skin is warm and dry. No erythema.  PICC line site of L chest w/o erythema, induration, or drainage Previous PICC site in L upper arm healing w/o evidence of infxn  Psychiatric: She has a normal mood and affect. Her behavior is normal. Thought content normal.  Vitals reviewed.         Assessment & Plan:

## 2015-02-02 NOTE — Patient Instructions (Signed)
Schedule your complete physical at your convenience Keep up the good work!  You look great! Call with any questions or concerns Happy Fall!!

## 2015-02-04 LAB — CULTURE, BLOOD (SINGLE): Organism ID, Bacteria: NO GROWTH

## 2015-02-04 NOTE — Assessment & Plan Note (Signed)
New to [name] [name].  Pt has completed abx course and is feeling well.  New PICC site is well healed w/o evidence of infxn.  No evidence of infxn at previous PICC site.  No changes at this time.  Will follow along.

## 2015-02-12 ENCOUNTER — Encounter: Payer: Self-pay | Admitting: Internal Medicine

## 2015-02-19 ENCOUNTER — Other Ambulatory Visit (INDEPENDENT_AMBULATORY_CARE_PROVIDER_SITE_OTHER): Payer: BLUE CROSS/BLUE SHIELD

## 2015-02-19 DIAGNOSIS — K912 Postsurgical malabsorption, not elsewhere classified: Secondary | ICD-10-CM

## 2015-02-19 LAB — COMPREHENSIVE METABOLIC PANEL
ALT: 26 U/L (ref 0–35)
AST: 25 U/L (ref 0–37)
Albumin: 3.7 g/dL (ref 3.5–5.2)
Alkaline Phosphatase: 95 U/L (ref 39–117)
BUN: 18 mg/dL (ref 6–23)
CHLORIDE: 107 meq/L (ref 96–112)
CO2: 23 meq/L (ref 19–32)
CREATININE: 0.9 mg/dL (ref 0.40–1.20)
Calcium: 9.1 mg/dL (ref 8.4–10.5)
GFR: 66.83 mL/min (ref >60.00)
GLUCOSE: 91 mg/dL (ref 70–99)
Potassium: 3.7 mEq/L (ref 3.5–5.1)
SODIUM: 136 meq/L (ref 135–145)
Total Bilirubin: 0.4 mg/dL (ref 0.2–1.2)
Total Protein: 8.3 g/dL (ref 6.0–8.3)

## 2015-02-19 LAB — CBC WITH DIFFERENTIAL/PLATELET
BASOS PCT: 0.6 % (ref 0.0–3.0)
Basophils Absolute: 0 10*3/uL (ref 0.0–0.1)
EOS PCT: 8.3 % — AB (ref 0.0–5.0)
Eosinophils Absolute: 0.4 10*3/uL (ref 0.0–0.7)
HEMATOCRIT: 36.2 % (ref 36.0–46.0)
HEMOGLOBIN: 11.7 g/dL — AB (ref 12.0–15.0)
LYMPHS ABS: 1.8 10*3/uL (ref 0.7–4.0)
Lymphocytes Relative: 37.2 % (ref 12.0–46.0)
MCHC: 32.4 g/dL (ref 30.0–36.0)
MCV: 95.4 fl (ref 78.0–100.0)
MONO ABS: 0.4 10*3/uL (ref 0.1–1.0)
Monocytes Relative: 8.4 % (ref 3.0–12.0)
Neutro Abs: 2.2 10*3/uL (ref 1.4–7.7)
Neutrophils Relative %: 45.5 % (ref 43.0–77.0)
PLATELETS: 93 10*3/uL — AB (ref 150.0–400.0)
RBC: 3.79 Mil/uL — ABNORMAL LOW (ref 3.87–5.11)
RDW: 14.3 % (ref 11.5–15.5)
WBC: 4.8 10*3/uL (ref 4.0–10.5)

## 2015-02-19 LAB — PHOSPHORUS: Phosphorus: 3.7 mg/dL (ref 2.3–4.6)

## 2015-02-19 LAB — TRIGLYCERIDES: TRIGLYCERIDES: 197 mg/dL — AB (ref 0.0–149.0)

## 2015-02-19 LAB — C-REACTIVE PROTEIN: CRP: 0.1 mg/dL — ABNORMAL LOW (ref 0.5–20.0)

## 2015-02-19 LAB — MAGNESIUM: MAGNESIUM: 1.8 mg/dL (ref 1.5–2.5)

## 2015-02-19 LAB — BILIRUBIN, DIRECT: BILIRUBIN DIRECT: 0.2 mg/dL (ref 0.0–0.3)

## 2015-02-20 LAB — PREALBUMIN: Prealbumin: 24 mg/dL (ref 17–34)

## 2015-03-12 ENCOUNTER — Other Ambulatory Visit (INDEPENDENT_AMBULATORY_CARE_PROVIDER_SITE_OTHER): Payer: BLUE CROSS/BLUE SHIELD

## 2015-03-12 DIAGNOSIS — K912 Postsurgical malabsorption, not elsewhere classified: Secondary | ICD-10-CM

## 2015-03-12 LAB — TRIGLYCERIDES: TRIGLYCERIDES: 180 mg/dL — AB (ref 0.0–149.0)

## 2015-03-12 LAB — CBC WITH DIFFERENTIAL/PLATELET
BASOS ABS: 0 10*3/uL (ref 0.0–0.1)
Basophils Relative: 0.8 % (ref 0.0–3.0)
Eosinophils Absolute: 0.3 10*3/uL (ref 0.0–0.7)
Eosinophils Relative: 8.2 % — ABNORMAL HIGH (ref 0.0–5.0)
HEMATOCRIT: 35.9 % — AB (ref 36.0–46.0)
Hemoglobin: 11.5 g/dL — ABNORMAL LOW (ref 12.0–15.0)
LYMPHS PCT: 48.1 % — AB (ref 12.0–46.0)
Lymphs Abs: 1.8 10*3/uL (ref 0.7–4.0)
MCHC: 32.1 g/dL (ref 30.0–36.0)
MCV: 94.4 fl (ref 78.0–100.0)
MONOS PCT: 9.7 % (ref 3.0–12.0)
Monocytes Absolute: 0.4 10*3/uL (ref 0.1–1.0)
NEUTROS PCT: 33.2 % — AB (ref 43.0–77.0)
Neutro Abs: 1.2 10*3/uL — ABNORMAL LOW (ref 1.4–7.7)
Platelets: 88 10*3/uL — ABNORMAL LOW (ref 150.0–400.0)
RBC: 3.8 Mil/uL — AB (ref 3.87–5.11)
RDW: 15 % (ref 11.5–15.5)
WBC: 3.8 10*3/uL — ABNORMAL LOW (ref 4.0–10.5)

## 2015-03-12 LAB — BILIRUBIN, DIRECT: BILIRUBIN DIRECT: 0.1 mg/dL (ref 0.0–0.3)

## 2015-03-12 LAB — COMPREHENSIVE METABOLIC PANEL
ALBUMIN: 3.7 g/dL (ref 3.5–5.2)
ALT: 25 U/L (ref 0–35)
AST: 24 U/L (ref 0–37)
Alkaline Phosphatase: 93 U/L (ref 39–117)
BILIRUBIN TOTAL: 0.5 mg/dL (ref 0.2–1.2)
BUN: 17 mg/dL (ref 6–23)
CALCIUM: 9.2 mg/dL (ref 8.4–10.5)
CO2: 23 meq/L (ref 19–32)
CREATININE: 0.95 mg/dL (ref 0.40–1.20)
Chloride: 106 mEq/L (ref 96–112)
GFR: 62.78 mL/min (ref >60.00)
Glucose, Bld: 77 mg/dL (ref 70–99)
Potassium: 4 mEq/L (ref 3.5–5.1)
Sodium: 138 mEq/L (ref 135–145)
TOTAL PROTEIN: 8.3 g/dL (ref 6.0–8.3)

## 2015-03-12 LAB — PHOSPHORUS: Phosphorus: 4.2 mg/dL (ref 2.3–4.6)

## 2015-03-12 LAB — C-REACTIVE PROTEIN: CRP: 0.1 mg/dL — ABNORMAL LOW (ref 0.5–20.0)

## 2015-03-12 LAB — MAGNESIUM: MAGNESIUM: 1.8 mg/dL (ref 1.5–2.5)

## 2015-03-13 LAB — PREALBUMIN: PREALBUMIN: 23 mg/dL (ref 17–34)

## 2015-03-15 NOTE — Progress Notes (Signed)
Quick Note:  Sheri - send to TPN pharmacist please ______

## 2015-03-22 ENCOUNTER — Other Ambulatory Visit: Payer: Self-pay

## 2015-03-22 DIAGNOSIS — K909 Intestinal malabsorption, unspecified: Secondary | ICD-10-CM

## 2015-03-22 DIAGNOSIS — K912 Postsurgical malabsorption, not elsewhere classified: Secondary | ICD-10-CM

## 2015-03-26 ENCOUNTER — Other Ambulatory Visit (INDEPENDENT_AMBULATORY_CARE_PROVIDER_SITE_OTHER): Payer: BLUE CROSS/BLUE SHIELD

## 2015-03-26 ENCOUNTER — Other Ambulatory Visit: Payer: Self-pay | Admitting: Internal Medicine

## 2015-03-26 ENCOUNTER — Other Ambulatory Visit: Payer: Self-pay

## 2015-03-26 DIAGNOSIS — K912 Postsurgical malabsorption, not elsewhere classified: Secondary | ICD-10-CM | POA: Diagnosis not present

## 2015-03-26 DIAGNOSIS — K909 Intestinal malabsorption, unspecified: Secondary | ICD-10-CM

## 2015-03-26 LAB — VITAMIN D 25 HYDROXY (VIT D DEFICIENCY, FRACTURES): VITD: 14.18 ng/mL — AB (ref 30.00–100.00)

## 2015-03-27 NOTE — Progress Notes (Signed)
Quick Note:  Send to pharmacist please Level is low Please clarify dose Sole is on and how frequently ______

## 2015-03-28 LAB — CARNITINE / ACYLCARNITINE PROFILE, BLD
CARNITINE, ESTERFIED/FREE: 1 ratio — AB (ref 0.1–0.9)
Carnitine, Free: 14 umol/L — ABNORMAL LOW (ref 16–60)
Carnitine, Total: 28 umol/L (ref 25–69)

## 2015-03-28 NOTE — Progress Notes (Signed)
Quick Note:  Route to pharmacist ______

## 2015-03-30 LAB — VITAMIN E
Gamma-Tocopherol (Vit E): 3.3 mg/L (ref <=4.3)
VITAMIN E (ALPHA TOCOPHEROL): 9.3 mg/L (ref 5.7–19.9)

## 2015-03-30 LAB — SELENIUM SERUM: Selenium, Blood: 109 mcg/L (ref 63–160)

## 2015-03-30 LAB — VITAMIN A: Vitamin A (Retinoic Acid): 41 ug/dL (ref 38–98)

## 2015-03-30 LAB — COPPER, SERUM: COPPER: 80 ug/dL (ref 70–175)

## 2015-03-30 LAB — ZINC: Zinc: 85 ug/dL (ref 60–130)

## 2015-03-31 LAB — CARNITINE, LC/MS/MS
CARNITINE, ESTERS: 8 umol/L (ref 4–13)
Carnitine, Free: 18 umol/L — ABNORMAL LOW (ref 19–48)
Carnitine, Total: 26 umol/L (ref 25–58)
Esterifield/Free Ratio: 0.44 — ABNORMAL HIGH (ref 0.13–0.42)

## 2015-03-31 LAB — MANGANESE: MANGANESE, BLOOD: 0.6 ug/L (ref <1.2)

## 2015-04-01 LAB — FATTY ACIDS PROFILE, PEROXISOMAL
C22 0: 37.09 umol/L — AB (ref 39.18–99.20)
DISCRIMINANT FUNCTION: 3.5 (ref <=5.0)
Hexacosanoic Acid,C26: 0.14 umol/L (ref <=0.94)
PHYTANIC ACID: 0.31 umol/L — AB (ref 0.48–3.13)
PRISTANIC ACID: 0.08 umol/L (ref <=0.35)
Pristanic/Phytanic: 0.25 ratio — ABNORMAL HIGH (ref 0.01–0.16)
Ratio C24/C22: 0.71 ratio (ref 0.64–0.99)
Ratio C26/C22: 0 ratio — ABNORMAL LOW (ref 0.002–0.010)
Tetracosanoic Acid, C24: 26.5 umol/L — ABNORMAL LOW (ref 31.26–84.11)

## 2015-04-02 LAB — CHROMIUM LEVEL: CHROMIUM, PLASMA: 1.7 ug/L (ref 0.1–2.1)

## 2015-04-02 NOTE — Progress Notes (Signed)
Quick Note:  Please send to pharmacist ______ 

## 2015-04-06 ENCOUNTER — Encounter: Payer: Self-pay | Admitting: Internal Medicine

## 2015-04-08 NOTE — Progress Notes (Signed)
Quick Note:  These results will be used by TPN pharmacists ______

## 2015-04-09 ENCOUNTER — Other Ambulatory Visit (INDEPENDENT_AMBULATORY_CARE_PROVIDER_SITE_OTHER): Payer: BLUE CROSS/BLUE SHIELD

## 2015-04-09 ENCOUNTER — Other Ambulatory Visit: Payer: Self-pay

## 2015-04-09 DIAGNOSIS — K912 Postsurgical malabsorption, not elsewhere classified: Secondary | ICD-10-CM

## 2015-04-09 LAB — CBC WITH DIFFERENTIAL/PLATELET
BASOS PCT: 0.5 % (ref 0.0–3.0)
Basophils Absolute: 0 10*3/uL (ref 0.0–0.1)
EOS PCT: 5.6 % — AB (ref 0.0–5.0)
Eosinophils Absolute: 0.2 10*3/uL (ref 0.0–0.7)
HEMATOCRIT: 35.6 % — AB (ref 36.0–46.0)
HEMOGLOBIN: 11.7 g/dL — AB (ref 12.0–15.0)
Lymphocytes Relative: 37.8 % (ref 12.0–46.0)
Lymphs Abs: 1.6 10*3/uL (ref 0.7–4.0)
MCHC: 33 g/dL (ref 30.0–36.0)
MCV: 93.7 fl (ref 78.0–100.0)
MONO ABS: 0.4 10*3/uL (ref 0.1–1.0)
Monocytes Relative: 8.5 % (ref 3.0–12.0)
Neutro Abs: 2 10*3/uL (ref 1.4–7.7)
Neutrophils Relative %: 47.6 % (ref 43.0–77.0)
Platelets: 98 10*3/uL — ABNORMAL LOW (ref 150.0–400.0)
RBC: 3.8 Mil/uL — ABNORMAL LOW (ref 3.87–5.11)
RDW: 15.6 % — AB (ref 11.5–15.5)
WBC: 4.3 10*3/uL (ref 4.0–10.5)

## 2015-04-09 LAB — COMPREHENSIVE METABOLIC PANEL
ALK PHOS: 80 U/L (ref 39–117)
ALT: 25 U/L (ref 0–35)
AST: 21 U/L (ref 0–37)
Albumin: 3.7 g/dL (ref 3.5–5.2)
BILIRUBIN TOTAL: 0.5 mg/dL (ref 0.2–1.2)
BUN: 14 mg/dL (ref 6–23)
CALCIUM: 9.1 mg/dL (ref 8.4–10.5)
CO2: 23 meq/L (ref 19–32)
CREATININE: 0.97 mg/dL (ref 0.40–1.20)
Chloride: 108 mEq/L (ref 96–112)
GFR: 61.27 mL/min (ref >60.00)
GLUCOSE: 89 mg/dL (ref 70–99)
Potassium: 3.7 mEq/L (ref 3.5–5.1)
Sodium: 138 mEq/L (ref 135–145)
TOTAL PROTEIN: 7.9 g/dL (ref 6.0–8.3)

## 2015-04-09 LAB — PHOSPHORUS: Phosphorus: 4 mg/dL (ref 2.3–4.6)

## 2015-04-09 LAB — TRIGLYCERIDES: Triglycerides: 152 mg/dL — ABNORMAL HIGH (ref 0.0–149.0)

## 2015-04-09 LAB — MAGNESIUM: Magnesium: 1.9 mg/dL (ref 1.5–2.5)

## 2015-04-09 LAB — BILIRUBIN, DIRECT: BILIRUBIN DIRECT: 0.1 mg/dL (ref 0.0–0.3)

## 2015-04-09 LAB — C-REACTIVE PROTEIN: CRP: 0.1 mg/dL — ABNORMAL LOW (ref 0.5–20.0)

## 2015-04-11 LAB — PREALBUMIN: Prealbumin: 21 mg/dL (ref 17–34)

## 2015-04-13 NOTE — Progress Notes (Signed)
Quick Note:  Ok Please send to pharmacist ______

## 2015-04-23 ENCOUNTER — Telehealth: Payer: Self-pay | Admitting: Internal Medicine

## 2015-04-23 NOTE — Telephone Encounter (Signed)
[name] Merino, RN, CGRN is going to work on getting this prior authorization done for [name] [name].

## 2015-04-23 NOTE — Telephone Encounter (Signed)
Spoke with patient and she is going to call the [employer] people and have them re-fax papers over to do the prior authorization as I told her I haven't seen anything yet.  If the papers don't have her new insurance information on them then she will bring her card up here to be scanned in.

## 2015-05-02 ENCOUNTER — Other Ambulatory Visit (INDEPENDENT_AMBULATORY_CARE_PROVIDER_SITE_OTHER): Payer: Medicare Other

## 2015-05-02 DIAGNOSIS — K912 Postsurgical malabsorption, not elsewhere classified: Secondary | ICD-10-CM | POA: Diagnosis not present

## 2015-05-02 LAB — COMPREHENSIVE METABOLIC PANEL
ALT: 20 U/L (ref 0–35)
AST: 19 U/L (ref 0–37)
Albumin: 3.6 g/dL (ref 3.5–5.2)
Alkaline Phosphatase: 76 U/L (ref 39–117)
BILIRUBIN TOTAL: 0.5 mg/dL (ref 0.2–1.2)
BUN: 24 mg/dL — AB (ref 6–23)
CHLORIDE: 108 meq/L (ref 96–112)
CO2: 21 meq/L (ref 19–32)
CREATININE: 0.98 mg/dL (ref 0.40–1.20)
Calcium: 9.1 mg/dL (ref 8.4–10.5)
GFR: 60.54 mL/min (ref >60.00)
GLUCOSE: 91 mg/dL (ref 70–99)
Potassium: 3.9 mEq/L (ref 3.5–5.1)
SODIUM: 137 meq/L (ref 135–145)
Total Protein: 7.7 g/dL (ref 6.0–8.3)

## 2015-05-02 LAB — CBC WITH DIFFERENTIAL/PLATELET
BASOS ABS: 0 10*3/uL (ref 0.0–0.1)
BASOS PCT: 0.5 % (ref 0.0–3.0)
EOS ABS: 0.2 10*3/uL (ref 0.0–0.7)
Eosinophils Relative: 5.4 % — ABNORMAL HIGH (ref 0.0–5.0)
HEMATOCRIT: 34.2 % — AB (ref 36.0–46.0)
Hemoglobin: 11.1 g/dL — ABNORMAL LOW (ref 12.0–15.0)
LYMPHS ABS: 2.1 10*3/uL (ref 0.7–4.0)
Lymphocytes Relative: 45.5 % (ref 12.0–46.0)
MCHC: 32.6 g/dL (ref 30.0–36.0)
MCV: 94.7 fl (ref 78.0–100.0)
MONO ABS: 0.5 10*3/uL (ref 0.1–1.0)
Monocytes Relative: 10.7 % (ref 3.0–12.0)
NEUTROS ABS: 1.7 10*3/uL (ref 1.4–7.7)
NEUTROS PCT: 37.9 % — AB (ref 43.0–77.0)
PLATELETS: 86 10*3/uL — AB (ref 150.0–400.0)
RBC: 3.61 Mil/uL — ABNORMAL LOW (ref 3.87–5.11)
RDW: 15.8 % — AB (ref 11.5–15.5)
WBC: 4.6 10*3/uL (ref 4.0–10.5)

## 2015-05-02 LAB — C-REACTIVE PROTEIN: CRP: 0.1 mg/dL — AB (ref 0.5–20.0)

## 2015-05-02 LAB — MAGNESIUM: Magnesium: 1.9 mg/dL (ref 1.5–2.5)

## 2015-05-02 LAB — PHOSPHORUS: PHOSPHORUS: 3.6 mg/dL (ref 2.3–4.6)

## 2015-05-02 LAB — BILIRUBIN, DIRECT: Bilirubin, Direct: 0.1 mg/dL (ref 0.0–0.3)

## 2015-05-02 LAB — TRIGLYCERIDES: Triglycerides: 138 mg/dL (ref 0.0–149.0)

## 2015-05-02 NOTE — Progress Notes (Signed)
Quick Note:  Labs ok/stable These get forwarded to home TPN agency/pharmacist ______

## 2015-05-03 LAB — PREALBUMIN: Prealbumin: 19 mg/dL (ref 17–34)

## 2015-05-03 NOTE — Progress Notes (Signed)
Quick Note:  Please forward to TPN pharmacist ______

## 2015-05-15 ENCOUNTER — Telehealth: Payer: Self-pay | Admitting: Internal Medicine

## 2015-05-15 ENCOUNTER — Ambulatory Visit (HOSPITAL_COMMUNITY)
Admission: RE | Admit: 2015-05-15 | Discharge: 2015-05-15 | Disposition: A | Discharge status: Home / Self Care | Payer: Medicare Other | Source: Ambulatory Visit | Attending: Internal Medicine | Admitting: Internal Medicine

## 2015-05-15 ENCOUNTER — Other Ambulatory Visit: Payer: Self-pay

## 2015-05-15 DIAGNOSIS — Z452 Encounter for adjustment and management of vascular access device: Secondary | ICD-10-CM

## 2015-05-15 DIAGNOSIS — K912 Postsurgical malabsorption, not elsewhere classified: Secondary | ICD-10-CM

## 2015-05-15 MED ORDER — HEPARIN SOD (PORK) LOCK FLUSH 100 UNIT/ML IV SOLN
INTRAVENOUS | Status: AC
  Administered 2015-05-15: 500 [IU]
  Filled 2015-05-15: qty 5

## 2015-05-15 MED ORDER — LIDOCAINE-EPINEPHRINE 2 %-1:100000 IJ SOLN
INTRAMUSCULAR | Status: DC
Start: 2015-05-15 — End: 2015-05-16
  Filled 2015-05-15: qty 1

## 2015-05-15 NOTE — Procedures (Signed)
Successful fluoro guided exchange of a left IJ approach 26 cm single lumen tunneled PICC line with tip at the superior caval-atrial junction.   The PICC line is ready for immediate use.  Ronny Bacon, MD Pager #: [phone]

## 2015-05-15 NOTE — Telephone Encounter (Signed)
Patient with chills when seh hooks up to her TPN for the last 3 times.  Discussed with Dr. Carlean Purl needs blood cult x 2 - one from peripheral stick the other from line, then exchanged.  She is scheduled for today at 3:00.  She is asked to arrive in radiology at [facility] at 2:30

## 2015-05-16 ENCOUNTER — Inpatient Hospital Stay (HOSPITAL_COMMUNITY)
Admission: EM | Admit: 2015-05-16 | Discharge: 2015-05-18 | DRG: 315 | Disposition: A | Discharge status: Home Health | Payer: Medicare Other | Attending: Internal Medicine | Admitting: Internal Medicine

## 2015-05-16 ENCOUNTER — Encounter (HOSPITAL_COMMUNITY): Payer: Self-pay | Admitting: Emergency Medicine

## 2015-05-16 ENCOUNTER — Telehealth: Payer: Self-pay | Admitting: Gastroenterology

## 2015-05-16 DIAGNOSIS — R509 Fever, unspecified: Secondary | ICD-10-CM | POA: Diagnosis not present

## 2015-05-16 DIAGNOSIS — Z95828 Presence of other vascular implants and grafts: Secondary | ICD-10-CM

## 2015-05-16 DIAGNOSIS — M810 Age-related osteoporosis without current pathological fracture: Secondary | ICD-10-CM | POA: Diagnosis present

## 2015-05-16 DIAGNOSIS — B9620 Unspecified Escherichia coli [E. coli] as the cause of diseases classified elsewhere: Secondary | ICD-10-CM | POA: Diagnosis present

## 2015-05-16 DIAGNOSIS — T80219A Unspecified infection due to central venous catheter, initial encounter: Secondary | ICD-10-CM | POA: Diagnosis present

## 2015-05-16 DIAGNOSIS — Z79899 Other long term (current) drug therapy: Secondary | ICD-10-CM

## 2015-05-16 DIAGNOSIS — E559 Vitamin D deficiency, unspecified: Secondary | ICD-10-CM | POA: Diagnosis present

## 2015-05-16 DIAGNOSIS — A498 Other bacterial infections of unspecified site: Secondary | ICD-10-CM | POA: Diagnosis not present

## 2015-05-16 DIAGNOSIS — Y848 Other medical procedures as the cause of abnormal reaction of the patient, or of later complication, without mention of misadventure at the time of the procedure: Secondary | ICD-10-CM | POA: Diagnosis present

## 2015-05-16 DIAGNOSIS — K912 Postsurgical malabsorption, not elsewhere classified: Secondary | ICD-10-CM | POA: Diagnosis not present

## 2015-05-16 DIAGNOSIS — Z880 Allergy status to penicillin: Secondary | ICD-10-CM

## 2015-05-16 DIAGNOSIS — Z7982 Long term (current) use of aspirin: Secondary | ICD-10-CM

## 2015-05-16 DIAGNOSIS — R7881 Bacteremia: Secondary | ICD-10-CM | POA: Diagnosis not present

## 2015-05-16 DIAGNOSIS — D638 Anemia in other chronic diseases classified elsewhere: Secondary | ICD-10-CM | POA: Diagnosis present

## 2015-05-16 DIAGNOSIS — Z91041 Radiographic dye allergy status: Secondary | ICD-10-CM

## 2015-05-16 DIAGNOSIS — Z833 Family history of diabetes mellitus: Secondary | ICD-10-CM

## 2015-05-16 DIAGNOSIS — K559 Vascular disorder of intestine, unspecified: Secondary | ICD-10-CM | POA: Diagnosis present

## 2015-05-16 DIAGNOSIS — Z8249 Family history of ischemic heart disease and other diseases of the circulatory system: Secondary | ICD-10-CM

## 2015-05-16 DIAGNOSIS — Z86010 Personal history of colonic polyps: Secondary | ICD-10-CM

## 2015-05-16 DIAGNOSIS — N179 Acute kidney failure, unspecified: Secondary | ICD-10-CM | POA: Diagnosis present

## 2015-05-16 DIAGNOSIS — Z452 Encounter for adjustment and management of vascular access device: Secondary | ICD-10-CM

## 2015-05-16 DIAGNOSIS — Z87891 Personal history of nicotine dependence: Secondary | ICD-10-CM

## 2015-05-16 DIAGNOSIS — Z8711 Personal history of peptic ulcer disease: Secondary | ICD-10-CM

## 2015-05-16 DIAGNOSIS — D696 Thrombocytopenia, unspecified: Secondary | ICD-10-CM | POA: Diagnosis present

## 2015-05-16 DIAGNOSIS — I420 Dilated cardiomyopathy: Secondary | ICD-10-CM | POA: Diagnosis present

## 2015-05-16 LAB — I-STAT CHEM 8, ED
BUN: 24 mg/dL — ABNORMAL HIGH (ref 6–20)
CHLORIDE: 108 mmol/L (ref 101–111)
CREATININE: 0.9 mg/dL (ref 0.44–1.00)
Calcium, Ion: 1.11 mmol/L — ABNORMAL LOW (ref 1.13–1.30)
GLUCOSE: 117 mg/dL — AB (ref 65–99)
HCT: 34 % — ABNORMAL LOW (ref 36.0–46.0)
HEMOGLOBIN: 11.6 g/dL — AB (ref 12.0–15.0)
POTASSIUM: 4.4 mmol/L (ref 3.5–5.1)
Sodium: 141 mmol/L (ref 135–145)
TCO2: 22 mmol/L (ref 0–100)

## 2015-05-16 LAB — CBC WITH DIFFERENTIAL/PLATELET
Basophils Absolute: 0 10*3/uL (ref 0.0–0.1)
Basophils Relative: 1 %
EOS PCT: 6 %
Eosinophils Absolute: 0.3 10*3/uL (ref 0.0–0.7)
HCT: 31.4 % — ABNORMAL LOW (ref 36.0–46.0)
Hemoglobin: 10.1 g/dL — ABNORMAL LOW (ref 12.0–15.0)
LYMPHS ABS: 1.7 10*3/uL (ref 0.7–4.0)
LYMPHS PCT: 36 %
MCH: 31.3 pg (ref 26.0–34.0)
MCHC: 32.2 g/dL (ref 30.0–36.0)
MCV: 97.2 fL (ref 78.0–100.0)
MONO ABS: 0.5 10*3/uL (ref 0.1–1.0)
MONOS PCT: 11 %
Neutro Abs: 2.2 10*3/uL (ref 1.7–7.7)
Neutrophils Relative %: 47 %
PLATELETS: 97 10*3/uL — AB (ref 150–400)
RBC: 3.23 MIL/uL — ABNORMAL LOW (ref 3.87–5.11)
RDW: 14.7 % (ref 11.5–15.5)
WBC: 4.6 10*3/uL (ref 4.0–10.5)

## 2015-05-16 LAB — COMPREHENSIVE METABOLIC PANEL
ALK PHOS: 90 U/L (ref 38–126)
ALT: 22 U/L (ref 14–54)
ANION GAP: 7 (ref 5–15)
AST: 28 U/L (ref 15–41)
Albumin: 3.1 g/dL — ABNORMAL LOW (ref 3.5–5.0)
BILIRUBIN TOTAL: 1 mg/dL (ref 0.3–1.2)
BUN: 18 mg/dL (ref 6–20)
CALCIUM: 8.3 mg/dL — AB (ref 8.9–10.3)
CO2: 23 mmol/L (ref 22–32)
CREATININE: 1.05 mg/dL — AB (ref 0.44–1.00)
Chloride: 108 mmol/L (ref 101–111)
GFR, EST NON AFRICAN AMERICAN: 55 mL/min — AB (ref >60)
Glucose, Bld: 118 mg/dL — ABNORMAL HIGH (ref 65–99)
Potassium: 4.4 mmol/L (ref 3.5–5.1)
Sodium: 138 mmol/L (ref 135–145)
TOTAL PROTEIN: 7.2 g/dL (ref 6.5–8.1)

## 2015-05-16 LAB — PHOSPHORUS: PHOSPHORUS: 3.9 mg/dL (ref 2.5–4.6)

## 2015-05-16 LAB — MAGNESIUM: Magnesium: 1.9 mg/dL (ref 1.7–2.4)

## 2015-05-16 MED ORDER — ENOXAPARIN SODIUM 40 MG/0.4ML ~~LOC~~ SOLN
40.0000 mg | SUBCUTANEOUS | Status: DC
  Filled 2015-05-16 (×3): qty 0.4

## 2015-05-16 MED ORDER — ONDANSETRON HCL 4 MG/2ML IJ SOLN: 4.0000 mg | Freq: Four times a day (QID) | INTRAMUSCULAR | Status: DC | PRN

## 2015-05-16 MED ORDER — CEFTAZIDIME 2 G IJ SOLR
2.0000 g | Freq: Once | INTRAMUSCULAR | Status: AC
  Administered 2015-05-16: 2 g via INTRAVENOUS
  Filled 2015-05-16: qty 2

## 2015-05-16 MED ORDER — CEFTAZIDIME 2 G IJ SOLR
2.0000 g | Freq: Three times a day (TID) | INTRAMUSCULAR | Status: DC
  Administered 2015-05-16 – 2015-05-18 (×5): 2 g via INTRAVENOUS
  Filled 2015-05-16 (×8): qty 2

## 2015-05-16 MED ORDER — HYDROCORTISONE 0.5 % EX OINT
TOPICAL_OINTMENT | Freq: Three times a day (TID) | CUTANEOUS | Status: DC | PRN
  Filled 2015-05-16: qty 28.35

## 2015-05-16 MED ORDER — ONDANSETRON HCL 4 MG PO TABS: 4.0000 mg | ORAL_TABLET | Freq: Four times a day (QID) | ORAL | Status: DC | PRN

## 2015-05-16 MED ORDER — HYDROCORTISONE 0.5 % EX CREA
TOPICAL_CREAM | Freq: Three times a day (TID) | CUTANEOUS | Status: DC | PRN
  Administered 2015-05-16: 1 via TOPICAL
  Filled 2015-05-16: qty 28.35

## 2015-05-16 MED ORDER — CHLORHEXIDINE GLUCONATE 0.12 % MT SOLN
15.0000 mL | Freq: Two times a day (BID) | OROMUCOSAL | Status: DC
  Administered 2015-05-16 – 2015-05-18 (×2): 15 mL via OROMUCOSAL
  Filled 2015-05-16 (×4): qty 15

## 2015-05-16 MED ORDER — CETYLPYRIDINIUM CHLORIDE 0.05 % MT LIQD: 7.0000 mL | Freq: Two times a day (BID) | OROMUCOSAL | Status: DC

## 2015-05-16 NOTE — Telephone Encounter (Signed)
[name] [name], Was called by micro lab this AM.  4/4 blood cultures prelim + (G neg rods), awaiting ID and sens.

## 2015-05-16 NOTE — Consult Note (Addendum)
[facility]  Total days of antibiotics 1        Day 1 ceftaz               Reason for Consult: GNR bacteremia   Referring Physician: Doyle Askew  Active Problems:   Gram-negative bacteremia ([place])   PICC line infection    HPI: [name] [name] is a 65 y.o. 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 she Also has an anemia of chronic disease, osteoporosis and thrombocytopenia and history of a dilated cardiomyopathy and EF of 45%. In late summer 2016, she had episode of serratia bacteremia associated with picc line, admitted during this episode to remove picc line, line holiday. On 1/3, She called her gastroenterologist, dr. Carlean Purl to mention she is having chills and rigors, roughly 40 minutes into her TPN infusion x 2 last episodes.. Given concern for picc line associated infection. She had blood cultures drawn and picc line exchanged/re-wired on 11/3. Blood cx from picc line and peripheral were growing GNR < 24hr. She was sent to the ED for further management. She is afebrile, no leukocytosis. Given a dose of ceftaz to cover GNR.   Past Medical History  Diagnosis Date  . 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 Abnormal LFTs   . Vitamin D deficiency   . Personal history of colonic polyps   . Thrombophilia ([place])   . Diabetes mellitus     resolved after weight loss  . Obesity     prior to weight loss  . Allergic rhinosinusitis   . At risk for dental problems   . Fracture of left clavicle   . Osteoporosis   . Anemia of chronic disease   . Renal insufficiency   . Atypical nevus   . 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 Pancytopenia 10/07/2011  . 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 Bacterial overgrowth syndrome   . Splenomegaly     By ultrasound  . 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 Carotid stenosis     Carotid US (9/15):  R 40-59%; L 1-39% >> FU 1 year  . Hx of  cardiovascular stress test     Myoview (9/15):  inf-apical scar; no ischemia; EF 47% - low risk   . 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 Serratia marcescens infection - bactermia assoc w/ PICC 01/18/2015    Allergies:  Allergies  Allergen Reactions  . Iohexol Itching    IVP Dye ok if taken with benadryl  . Penicillins Itching and Swelling    Has patient had a PCN reaction causing immediate rash, facial/tongue/throat swelling, SOB or lightheadedness with hypotension: yes Has patient had a PCN reaction causing severe rash involving mucus membranes or skin necrosis: unknown Has patient had a PCN reaction that required hospitalization : no, dr came to the house Has patient had a PCN reaction occurring within the last 10 years: no If all of the above answers are "NO", then may proceed with Cephalosporin use.     MEDICATIONS:   Social History  Substance Use Topics  . Smoking status: Former Smoker    Types: Cigarettes    Quit date: 05/12/2004  . Smokeless tobacco: Never Used  . Alcohol Use: No    Family History  Problem Relation Age of Onset  . Diabetes Mother   . Hypertension Mother   . Colon  cancer Neg Hx   . Stomach cancer Neg Hx    Review of Systems  Constitutional: positive for rigors and chills. But negative for diaphoresis, activity change, appetite change, fatigue and unexpected weight change.  HENT: positive for nasal congestion, ears feeling plugged,but negative sore throat, rhinorrhea, sneezing, trouble swallowing and sinus pressure.  Eyes: Negative for photophobia and visual disturbance.  Respiratory: Negative for cough, chest tightness, shortness of breath, wheezing and stridor.  Cardiovascular: Negative for chest pain, palpitations and leg swelling.  Gastrointestinal: + diarrhea due to short gut syndrome. Negative for nausea, vomiting, abdominal pain,  constipation, blood in stool, abdominal distention and anal bleeding.  Genitourinary: Negative for dysuria,  hematuria, flank pain and difficulty urinating.  Musculoskeletal: Negative for myalgias, back pain, joint swelling, arthralgias and gait problem.  Skin: Negative for color change, pallor, rash and wound.  Neurological: Negative for dizziness, tremors, weakness and light-headedness.  Hematological: Negative for adenopathy. Does not bruise/bleed easily.  Psychiatric/Behavioral: Negative for behavioral problems, confusion, sleep disturbance, dysphoric mood, decreased concentration and agitation.     OBJECTIVE: Temp:  [97.6 F (36.4 C)-97.9 F (36.6 C)] 97.9 F (36.6 C) (01/04 1443) Pulse Rate:  [55-64] 55 (01/04 1443) Resp:  [18] 18 (01/04 1443) BP: (127-130)/(63-67) 130/63 mmHg (01/04 1443) SpO2:  [100 %] 100 % (01/04 1443) Physical Exam  Constitutional:  oriented to person, place, and time. appears well-developed and well-nourished. No distress.  HENT: [place]/AT, PERRLA, no scleral icterus Mouth/Throat: Oropharynx is clear and moist. No oropharyngeal exudate.  Cardiovascular: Normal rate, regular rhythm and normal heart sounds. Exam reveals no gallop and no friction rub.  No murmur heard.  Pulmonary/Chest: Effort normal and breath sounds normal. No respiratory distress.  has no wheezes.  Neck = supple, no nuchal rigidity Abdominal: Soft. Bowel sounds are normal.  exhibits no distension. There is no tenderness.  Lymphadenopathy: no cervical adenopathy. No axillary adenopathy Neurological: alert and oriented to person, place, and time.  Skin: Skin is warm and dry. Mild redness near dressing of picc line, no soreness or red track  Psychiatric: a normal mood and affect.  behavior is normal.     LABS: Results for orders placed or performed during the hospital encounter of 05/16/15 (from the past 48 hour(s))  CBC with Differential/Platelet     Status: Abnormal   Collection Time: 05/16/15  1:24 PM  Result Value Ref Range   WBC 4.6 4.0 - 10.5 K/uL   RBC 3.23 (L) 3.87 - 5.11 MIL/uL    Hemoglobin 10.1 (L) 12.0 - 15.0 g/dL   HCT 31.4 (L) 36.0 - 46.0 %   MCV 97.2 78.0 - 100.0 fL   MCH 31.3 26.0 - 34.0 pg   MCHC 32.2 30.0 - 36.0 g/dL   RDW 14.7 11.5 - 15.5 %   Platelets 97 (L) 150 - 400 K/uL    Comment: REPEATED TO VERIFY SPECIMEN CHECKED FOR CLOTS PLATELET COUNT CONFIRMED BY SMEAR    Neutrophils Relative % 47 %   Neutro Abs 2.2 1.7 - 7.7 K/uL   Lymphocytes Relative 36 %   Lymphs Abs 1.7 0.7 - 4.0 K/uL   Monocytes Relative 11 %   Monocytes Absolute 0.5 0.1 - 1.0 K/uL   Eosinophils Relative 6 %   Eosinophils Absolute 0.3 0.0 - 0.7 K/uL   Basophils Relative 1 %   Basophils Absolute 0.0 0.0 - 0.1 K/uL  I-stat chem 8, ed     Status: Abnormal   Collection Time: 05/16/15  1:46 PM  Result  Value Ref Range   Sodium 141 135 - 145 mmol/L   Potassium 4.4 3.5 - 5.1 mmol/L   Chloride 108 101 - 111 mmol/L   BUN 24 (H) 6 - 20 mg/dL   Creatinine, Ser 0.90 0.44 - 1.00 mg/dL   Glucose, Bld 117 (H) 65 - 99 mg/dL   Calcium, Ion 1.11 (L) 1.13 - 1.30 mmol/L   TCO2 22 0 - 100 mmol/L   Hemoglobin 11.6 (L) 12.0 - 15.0 g/dL   HCT 34.0 (L) 36.0 - 46.0 %    MICRO: 1/3 peripheral blood cx gnr 1/3 picc line cx gnr 1/3 cath tip. pending IMAGING: Ir Fluoro Guide Cv Line Left  05/15/2015  INDICATION: History of short gut syndrome, in need of permanent intravenous access for TPN administration. Patient most recently underwent ultrasound fluoroscopic guided left internal jugular approach PICC line placement on 01/22/2015 secondary to decreased intravenous access sites within her bilateral upper extremities. Patient reports intermittent chills during tPA administration during her last several sessions and as such returns today for fluoroscopic guided tunneled PICC line catheter exchange. EXAM: TUNNELED PICC REPLACEMENT WITH FLUOROSCOPIC GUIDANCE MEDICATIONS: None CONTRAST:  None FLUOROSCOPY TIME:  54 seconds (7 mGy) COMPLICATIONS: None immediate PROCEDURE: Informed written consent was obtained  from the patient after a discussion of the risks, benefits, and alternatives to treatment. Questions regarding the procedure were encouraged and answered. The skin and external portion of the existing hemodialysis catheter was prepped with chlorhexidine in a sterile fashion, and a sterile drape was applied covering the operative field. Maximum barrier sterile technique with sterile gowns and gloves were used for the procedure. A timeout was performed prior to the initiation of the procedure. A preprocedural spot fluoroscopic image was obtained of the existing left internal jugular approach single-lumen PICC line. A small amount of blood was aspirated from the existing PICC line and sent to the laboratory for blood culture analysis. The external portion of the PICC line was cut and cannulated with a stiff Glidewire and advanced to the level of the right IVC. Under intermittent fluoroscopic guidance, the existing dialysis catheter was exchanged for a new 26 cm tip to cuff Hemosplit dialysis catheter with tips ultimately terminating within the superior aspect of the right atrium. Final catheter positioning was confirmed and documented with a spot radiographic image. The removed PICC line catheter tip was cut and sent to the laboratory for analysis. The new catheter aspirates and flushes normally. The catheter was flushed with appropriate volume heparin dwell. The catheter exit site was secured with a 0-Prolene retention sutures. Both lumens were heparinized. A dressing was placed. The patient tolerated the procedure well without immediate post procedural complication. IMPRESSION: Successful replacement of 26 cm tip to cuff tunneled single-lumen PICC with tip terminating within the right atrium. The catheter is ready for immediate use. Blood was aspirated and the tip of the existing PICC line and sent to the laboratory for culture analysis. Electronically Signed   By: Sandi Mariscal M.D.   On: 05/15/2015 16:10     Assessment/Plan:  64yo F with short gut syndrome, TPN dependent with central line. She had reported having chills associated with using central line, found to have GNR bacteremia. Sensitivities/ID pending, currently on ceftaz  - for now continue on ceftraz until blood cx results return with more information over next 1-2 days. Pending organism, we may need to remove line and give line holiday. She currently has a new line placed on 1/3, though placed in setting of bacteremia  since she had not started on any abtx  - will need 14 days of abtx, await id sensitivities to give final recs  - fevers = continue to monitor while on treatment  - short gut syndrome = continue with her current regimen of TPN three times per week  Jayliana Valencia B. [facility] [phone]

## 2015-05-16 NOTE — ED Notes (Signed)
Pt can go to floor at 1605

## 2015-05-16 NOTE — ED Provider Notes (Signed)
CSN: [id number]     Arrival date & time 05/16/15  1517 History   First MD Initiated Contact with Patient 05/16/15 1259     Chief Complaint  Patient presents with  . Positive Blood Cultures      (Consider location/radiation/quality/duration/timing/severity/associated sxs/prior Treatment) HPI Patient reports that she had chills while administering TPN 5 days ago and again 2 days ago. She is presently asymptomatic. She had outpatient blood cultures done yesterday which showed gram-negative rods. Her PICC line was exchanged yesterday at interventional radiology. Blood cultures were obtained prior to PICC line being exchanged. She denies other symptoms and is presently asymptomatic. No treatment prior to coming here. She was contacted by Dr.Gessner's office today and told to come to the emergency department for evaluation, treatment and admission, and infectious disease consultation Past Medical History  Diagnosis Date  . 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 Abnormal LFTs   . Vitamin D deficiency   . Personal history of colonic polyps   . Thrombophilia ([place])   . Diabetes mellitus     resolved after weight loss  . Obesity     prior to weight loss  . Allergic rhinosinusitis   . At risk for dental problems   . Fracture of left clavicle   . Osteoporosis   . Anemia of chronic disease   . Renal insufficiency   . Atypical nevus   . 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 Pancytopenia 10/07/2011  . 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 Bacterial overgrowth syndrome   . Splenomegaly     By ultrasound  . 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 Carotid stenosis     Carotid US (9/15):  R 40-59%; L 1-39% >> FU 1 year  . Hx of cardiovascular stress test     Myoview (9/15):  inf-apical scar; no ischemia; EF 47% - low risk   . 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 Serratia marcescens infection - bactermia assoc w/ PICC 01/18/2015   Past Surgical History  Procedure  Laterality Date  . Small intestine surgery  2005    multiple with right colon resection for ischemia/infarct  . Cholecystectomy    . Orif proximal femoral fracture w/ itst nail system  03/2007    left, Dr. Shellia Carwin  . Esophagogastroduodenoscopy  01/22/2009    erosive esophagitis  . Colonoscopy  12/05/2005    internal hemorrhoids (for polyp surveillance)  . Colonoscopy  04/26/2012    Procedure: COLONOSCOPY;  Surgeon: Gatha Mayer, MD;  Location: [facility];  Service: Endoscopy;  Laterality: N/A;   Family History  Problem Relation Age of Onset  . Diabetes Mother   . Hypertension Mother   . Colon cancer Neg Hx   . Stomach cancer Neg Hx    Social History  Substance Use Topics  . Smoking status: Former Smoker    Types: Cigarettes    Quit date: 05/12/2004  . Smokeless tobacco: Never Used  . Alcohol Use: No   OB History    No data available     Review of Systems  Constitutional: Positive for chills.  HENT: Negative.   Respiratory: Negative.   Cardiovascular: Negative.   Gastrointestinal: Positive for diarrhea.       Chronic diarrhea  Musculoskeletal: Negative.   Skin: Negative.   Neurological: Negative.   Psychiatric/Behavioral: Negative.   All other systems reviewed and are negative.     Allergies  Iohexol and Penicillins  Home Medications   Prior to Admission medications   Medication Sig Start Date End Date Taking? Authorizing Provider  ADULT TPN Home TPN from [facility] 6m bag, 4 nights week for 8 hours. Monday, Wednesday, Thursday and Fridays   Yes Historical Provider, MD  aspirin EC 81 MG tablet Take 1 tablet (81 mg total) by mouth daily. 11/17/13  Yes SLiliane Shi PA-C  Cholecalciferol (VITAMIN D-3 PO) Take 1 tablet by mouth daily.    Yes Historical Provider, MD  ciprofloxacin (CIPRO) 500 MG tablet Take 500 mg by mouth 2 (two) times daily.   Yes Historical Provider, MD  clobetasol ointment (TEMOVATE) 06.37% Apply 1 application topically 2 (two)  times daily. Patient taking differently: Apply 1 application topically 2 (two) times daily as needed (around the pick for a rash/irritation).  03/21/14  Yes CGatha Mayer MD  diphenhydrAMINE (BENADRYL) 25 mg capsule Take 25 mg by mouth every 6 (six) hours as needed. For itching/sleep.   Yes Historical Provider, MD  diphenoxylate-atropine (LOMOTIL) 2.5-0.025 MG per tablet Take 2 tablets by mouth 1/2 hour before meals. Patient taking differently: Take 2 tablets by mouth 3 (three) times daily as needed for diarrhea or loose stools (half hour before meals).  07/21/13  Yes CGatha Mayer MD  loperamide (IMODIUM A-D) 2 MG tablet Take 2 tablets 30 minutes prior to each meal Patient taking differently: Take 4 mg by mouth 3 (three) times daily as needed for diarrhea or loose stools. Take 2 tablets 30 minutes prior to each meal 08/09/12  Yes CGatha Mayer MD  Multiple Vitamin (MULTIVITAMIN WITH MINERALS) TABS Take 1 tablet by mouth daily.   Yes Historical Provider, MD  omeprazole (PRILOSEC) 40 MG capsule TAKE ONE CAPSULE BY MOUTH TWICE DAILY AT 10 AM AND 5 PM Patient taking differently: Take 40 mg by mouth 2 (two) times daily. TAKE ONE CAPSULE BY MOUTH TWICE DAILY AT 10 AM AND 5 PM 01/08/15  Yes CGatha Mayer MD  ondansetron (ZOFRAN) 4 MG tablet Take 1 tablet (4 mg total) by mouth every 8 (eight) hours as needed for nausea or vomiting. 02/02/15  Yes KMidge Minium MD  Teduglutide, rDNA, (GATTEX) 5 MG KIT Inject 3.3 Units into the skin daily. 09/11/14  Yes CGatha Mayer MD  VITAMIN E PO Take 1 tablet by mouth daily.    Yes Historical Provider, MD  pravastatin (PRAVACHOL) 20 MG tablet Take 1 tablet (20 mg total) by mouth every evening. Patient not taking: Reported on 01/17/2015 11/24/13   SLiliane Shi PA-C   BP 127/67 mmHg  Pulse 64  Temp(Src) 97.6 F (36.4 C) (Oral)  Resp 18  SpO2 100%  LMP 02/26/2014 Physical Exam  Constitutional: She appears well-developed and well-nourished.  HENT:  Head:  Normocephalic and atraumatic.  Eyes: Conjunctivae are normal. Pupils are equal, round, and reactive to light.  Neck: Neck supple. No tracheal deviation present. No thyromegaly present.  Cardiovascular: Normal rate and regular rhythm.   No murmur heard. Pulmonary/Chest: Effort normal and breath sounds normal.  PICC line in place at left subclavian area. Site not read warm or tender  Abdominal: Soft. Bowel sounds are normal. She exhibits no distension. There is no tenderness.  Musculoskeletal: Normal range of motion. She exhibits no edema or tenderness.  Neurological: She is alert. Coordination normal.  Skin: Skin is warm and dry. No rash noted.  Psychiatric: She has a normal mood and affect.  Nursing note and vitals reviewed.  ED Course  Procedures (including critical care time) Labs Review Labs Reviewed - No data to display  Imaging Review Ir Fluoro Guide Cv Line Left  05/15/2015  INDICATION: History of short gut syndrome, in need of permanent intravenous access for TPN administration. Patient most recently underwent ultrasound fluoroscopic guided left internal jugular approach PICC line placement on 01/22/2015 secondary to decreased intravenous access sites within her bilateral upper extremities. Patient reports intermittent chills during tPA administration during her last several sessions and as such returns today for fluoroscopic guided tunneled PICC line catheter exchange. EXAM: TUNNELED PICC REPLACEMENT WITH FLUOROSCOPIC GUIDANCE MEDICATIONS: None CONTRAST:  None FLUOROSCOPY TIME:  54 seconds (7 mGy) COMPLICATIONS: None immediate PROCEDURE: Informed written consent was obtained from the patient after a discussion of the risks, benefits, and alternatives to treatment. Questions regarding the procedure were encouraged and answered. The skin and external portion of the existing hemodialysis catheter was prepped with chlorhexidine in a sterile fashion, and a sterile drape was applied covering  the operative field. Maximum barrier sterile technique with sterile gowns and gloves were used for the procedure. A timeout was performed prior to the initiation of the procedure. A preprocedural spot fluoroscopic image was obtained of the existing left internal jugular approach single-lumen PICC line. A small amount of blood was aspirated from the existing PICC line and sent to the laboratory for blood culture analysis. The external portion of the PICC line was cut and cannulated with a stiff Glidewire and advanced to the level of the right IVC. Under intermittent fluoroscopic guidance, the existing dialysis catheter was exchanged for a new 26 cm tip to cuff Hemosplit dialysis catheter with tips ultimately terminating within the superior aspect of the right atrium. Final catheter positioning was confirmed and documented with a spot radiographic image. The removed PICC line catheter tip was cut and sent to the laboratory for analysis. The new catheter aspirates and flushes normally. The catheter was flushed with appropriate volume heparin dwell. The catheter exit site was secured with a 0-Prolene retention sutures. Both lumens were heparinized. A dressing was placed. The patient tolerated the procedure well without immediate post procedural complication. IMPRESSION: Successful replacement of 26 cm tip to cuff tunneled single-lumen PICC with tip terminating within the right atrium. The catheter is ready for immediate use. Blood was aspirated and the tip of the existing PICC line and sent to the laboratory for culture analysis. Electronically Signed   By: Sandi Mariscal M.D.   On: 05/15/2015 16:10   I have personally reviewed and evaluated these images and lab results as part of my medical decision-making.   EKG Interpretation None     2:55 PM patient remains asymptomatic and appears well. Results for orders placed or performed during the hospital encounter of 05/16/15  CBC with Differential/Platelet  Result  Value Ref Range   WBC 4.6 4.0 - 10.5 K/uL   RBC 3.23 (L) 3.87 - 5.11 MIL/uL   Hemoglobin 10.1 (L) 12.0 - 15.0 g/dL   HCT 31.4 (L) 36.0 - 46.0 %   MCV 97.2 78.0 - 100.0 fL   MCH 31.3 26.0 - 34.0 pg   MCHC 32.2 30.0 - 36.0 g/dL   RDW 14.7 11.5 - 15.5 %   Platelets 97 (L) 150 - 400 K/uL   Neutrophils Relative % 47 %   Neutro Abs 2.2 1.7 - 7.7 K/uL   Lymphocytes Relative 36 %   Lymphs Abs 1.7 0.7 - 4.0 K/uL   Monocytes Relative 11 %  Monocytes Absolute 0.5 0.1 - 1.0 K/uL   Eosinophils Relative 6 %   Eosinophils Absolute 0.3 0.0 - 0.7 K/uL   Basophils Relative 1 %   Basophils Absolute 0.0 0.0 - 0.1 K/uL  I-stat chem 8, ed  Result Value Ref Range   Sodium 141 135 - 145 mmol/L   Potassium 4.4 3.5 - 5.1 mmol/L   Chloride 108 101 - 111 mmol/L   BUN 24 (H) 6 - 20 mg/dL   Creatinine, Ser 0.90 0.44 - 1.00 mg/dL   Glucose, Bld 117 (H) 65 - 99 mg/dL   Calcium, Ion 1.11 (L) 1.13 - 1.30 mmol/L   TCO2 22 0 - 100 mmol/L   Hemoglobin 11.6 (L) 12.0 - 15.0 g/dL   HCT 34.0 (L) 36.0 - 46.0 %   Ir Fluoro Guide Cv Line Left  05/15/2015  INDICATION: History of short gut syndrome, in need of permanent intravenous access for TPN administration. Patient most recently underwent ultrasound fluoroscopic guided left internal jugular approach PICC line placement on 01/22/2015 secondary to decreased intravenous access sites within her bilateral upper extremities. Patient reports intermittent chills during tPA administration during her last several sessions and as such returns today for fluoroscopic guided tunneled PICC line catheter exchange. EXAM: TUNNELED PICC REPLACEMENT WITH FLUOROSCOPIC GUIDANCE MEDICATIONS: None CONTRAST:  None FLUOROSCOPY TIME:  54 seconds (7 mGy) COMPLICATIONS: None immediate PROCEDURE: Informed written consent was obtained from the patient after a discussion of the risks, benefits, and alternatives to treatment. Questions regarding the procedure were encouraged and answered. The skin and  external portion of the existing hemodialysis catheter was prepped with chlorhexidine in a sterile fashion, and a sterile drape was applied covering the operative field. Maximum barrier sterile technique with sterile gowns and gloves were used for the procedure. A timeout was performed prior to the initiation of the procedure. A preprocedural spot fluoroscopic image was obtained of the existing left internal jugular approach single-lumen PICC line. A small amount of blood was aspirated from the existing PICC line and sent to the laboratory for blood culture analysis. The external portion of the PICC line was cut and cannulated with a stiff Glidewire and advanced to the level of the right IVC. Under intermittent fluoroscopic guidance, the existing dialysis catheter was exchanged for a new 26 cm tip to cuff Hemosplit dialysis catheter with tips ultimately terminating within the superior aspect of the right atrium. Final catheter positioning was confirmed and documented with a spot radiographic image. The removed PICC line catheter tip was cut and sent to the laboratory for analysis. The new catheter aspirates and flushes normally. The catheter was flushed with appropriate volume heparin dwell. The catheter exit site was secured with a 0-Prolene retention sutures. Both lumens were heparinized. A dressing was placed. The patient tolerated the procedure well without immediate post procedural complication. IMPRESSION: Successful replacement of 26 cm tip to cuff tunneled single-lumen PICC with tip terminating within the right atrium. The catheter is ready for immediate use. Blood was aspirated and the tip of the existing PICC line and sent to the laboratory for culture analysis. Electronically Signed   By: Sandi Mariscal M.D.   On: 05/15/2015 16:10    MDM  Infectious disease was consulted. I spoke with Dr. Graylon Good who will see patient in consultation. She suggests ceftazidime 2 g intravenously. She does not feel the PICC  line needs to be removed at present as the line was switched out yesterday in interventional radiology Final diagnoses:  None  Anemia  and thrombocytopenia  chronic I consulted with Dr. Doyle Askew who arranged for 23 hour observation medical surgical floor.  Dx #1 gram negative bacteremia #2 anemia #3 thrombocytopenia    Orlie Dakin, MD 05/16/15 1500

## 2015-05-16 NOTE — ED Notes (Signed)
Pt had a picc line exchange yesterday and had cultures drawn.  Was called this morning because she had positive cultures.  Sent to ED for treatment.  Pt has picc line placed for TPN.  Has been on TPN for 10 years.  No symptoms at this time.

## 2015-05-16 NOTE — Telephone Encounter (Signed)
I spoke with patient and she will go to [facility] ED.  I notified Alonza Bogus, PA she will coordinate with ER and admitting MD to ensure admission and ID consult.

## 2015-05-16 NOTE — Telephone Encounter (Signed)
She needs to go to hospital for admission and IV antibiotics  She has had repeated problems with + blood cxs and I am concerned about this and what it could cause down the road - believe she needs admit and removal of PICC and an ID consult   Please try to alert the ED she goes to

## 2015-05-16 NOTE — Progress Notes (Signed)
Patient in the ED and has been triaged for positive blood cultures with gram negative rods.  Spoke with Dr. Winfred Leeds who agreed to facilitate admission to the hospitalist service.  I will then touch base with the admitting physician regarding IV antibiotics, ID consult, PICC removal, etc.  Dr. Carlean Purl will see patient non-formally since there is no active GI problems.

## 2015-05-16 NOTE — H&P (Addendum)
[facility] History and Physical  [name] [name] UXL:[id number] DOB: [birth year] DOA: 05/16/2015  Referring physician: ED physician, Dr. Winfred Leeds  PCP: Annye Asa, MD   Chief Complaint: send from Dr. Celesta Aver office, infected PICC line   HPI:  65 y.o. 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 She has seen Dr. Carlean Purl for evaluation of subjective fevers, chills, had blood cultures drawn on 1/3 which grew g- rods and pt was called to come to [facility] for admission and further evaluation. Pt came to ED and reported she has not had any specific concerns except intermittent fevers and chills but also said she feels much better today, no abd or urinary concerns, no chest pain, no appetite loss. ID doctor consulted but Dr. Lenna Sciara and [facility] asked to admit for further evaluation.   In ED, pt noted to be hemodynamically stable, VSS, blood work notable for Plt 97 and Cr 1.05. Medical bed requested. Of note, pt had PICC line replaced on 05/14/2014.   Assessment and Plan: Active Problems:   Gram-negative bacteremia ([place]) - agree with admission to medical bed - repeat blood cultures requested - ID consulted, will follow up on recommendations in terms of ABX use, for now on Fortaz  - monitor CBC - may need PICC line removal pending blood cultures    Thrombocytopenia - chronic - no signs of bleeding  - CBC in AM   Acute kidney injury - appears to be pre renal  - IVF will be provided - BMP in AM   Short bowel syndrome - per GI team - pt apparently on TPN and at time eating as well   DVT prophylaxis - Lovenox SQ  Radiological Exams on Admission: Ir Fluoro Guide Cv Line Left  05/15/2015  INDICATION: History of short gut syndrome, in need of permanent intravenous access for TPN administration. Patient most  recently underwent ultrasound fluoroscopic guided left internal jugular approach PICC line placement on 01/22/2015 secondary to decreased intravenous access sites within her bilateral upper extremities. Patient reports intermittent chills during tPA administration during her last several sessions and as such returns today for fluoroscopic guided tunneled PICC line catheter exchange. EXAM: TUNNELED PICC REPLACEMENT WITH FLUOROSCOPIC GUIDANCE MEDICATIONS: None CONTRAST:  None FLUOROSCOPY TIME:  54 seconds (7 mGy) COMPLICATIONS: None immediate PROCEDURE: Informed written consent was obtained from the patient after a discussion of the risks, benefits, and alternatives to treatment. Questions regarding the procedure were encouraged and answered. The skin and external portion of the existing hemodialysis catheter was prepped with chlorhexidine in a sterile fashion, and a sterile drape was applied covering the operative field. Maximum barrier sterile technique with sterile gowns and gloves were used for the procedure. A timeout was performed prior to the initiation of the procedure. A preprocedural spot fluoroscopic image was obtained of the existing left internal jugular approach single-lumen PICC line. A small amount of blood was aspirated from the existing PICC line and sent to the laboratory for blood culture analysis. The external portion of the PICC line was cut and cannulated with a stiff Glidewire and advanced to the level of the right IVC. Under intermittent fluoroscopic guidance, the existing dialysis catheter was exchanged for a new 26 cm tip to cuff Hemosplit dialysis catheter with tips ultimately terminating within the superior aspect of the right atrium. Final catheter positioning was confirmed and  documented with a spot radiographic image. The removed PICC line catheter tip was cut and sent to the laboratory for analysis. The new catheter aspirates and flushes normally. The catheter was flushed with  appropriate volume heparin dwell. The catheter exit site was secured with a 0-Prolene retention sutures. Both lumens were heparinized. A dressing was placed. The patient tolerated the procedure well without immediate post procedural complication. IMPRESSION: Successful replacement of 26 cm tip to cuff tunneled single-lumen PICC with tip terminating within the right atrium. The catheter is ready for immediate use. Blood was aspirated and the tip of the existing PICC line and sent to the laboratory for culture analysis. Electronically Signed   By: Sandi Mariscal M.D.   On: 05/15/2015 16:10     Code Status: Full Family Communication: Pt at bedside Disposition Plan: Admit for further evaluation    [facility] [phone]   Review of Systems:  Constitutional:  Negative for diaphoresis.  HENT: Negative for hearing loss, ear pain, nosebleeds, congestion, sore throat, neck pain, tinnitus and ear discharge.   Eyes: Negative for blurred vision, double vision, photophobia, pain, discharge and redness.  Respiratory: Negative for cough, hemoptysis, sputum production, shortness of breath, wheezing and stridor.   Cardiovascular: Negative for chest pain, palpitations, orthopnea, claudication and leg swelling.  Gastrointestinal: Negative for nausea, vomiting and abdominal pain.  Genitourinary: Negative for dysuria, urgency, frequency, hematuria and flank pain.  Musculoskeletal: Negative for myalgias, back pain, joint pain and falls.  Skin: Negative for itching and rash.  Neurological: Negative for dizziness and weakness.  Endo/Heme/Allergies: Negative for environmental allergies and polydipsia. Does not bruise/bleed easily.  Psychiatric/Behavioral: Negative for suicidal ideas. The patient is not nervous/anxious.      Past Medical History  Diagnosis Date  . 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 Abnormal LFTs   . Vitamin D deficiency   . Personal history of colonic polyps   .  Thrombophilia ([place])   . Diabetes mellitus     resolved after weight loss  . Obesity     prior to weight loss  . Allergic rhinosinusitis   . At risk for dental problems   . Fracture of left clavicle   . Osteoporosis   . Anemia of chronic disease   . Renal insufficiency   . Atypical nevus   . 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 Pancytopenia 10/07/2011  . 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[place])   . Bacterial overgrowth syndrome   . Splenomegaly     By ultrasound  . 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 Carotid stenosis     Carotid US (9/15):  R 40-59%; L 1-39% >> FU 1 year  . Hx of cardiovascular stress test     Myoview (9/15):  inf-apical scar; no ischemia; EF 47% - low risk   . 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 Serratia marcescens infection - bactermia assoc w/ PICC 01/18/2015    Past Surgical History  Procedure Laterality Date  . Small intestine surgery  2005    multiple with right colon resection for ischemia/infarct  . Cholecystectomy    . Orif proximal femoral fracture w/ itst nail system  03/2007    left, Dr. Shellia Carwin  . Esophagogastroduodenoscopy  01/22/2009    erosive esophagitis  . Colonoscopy  12/05/2005    internal hemorrhoids (for polyp surveillance)  . Colonoscopy  04/26/2012    Procedure: COLONOSCOPY;  Surgeon: Gatha Mayer, MD;  Location:  [facility];  Service: Endoscopy;  Laterality: N/A;    Social History:  reports that she quit smoking about 11 years ago. Her smoking use included Cigarettes. She has never used smokeless tobacco. She reports that she does not drink alcohol or use illicit drugs.  Allergies  Allergen Reactions  . Iohexol Itching    IVP Dye ok if taken with benadryl  . Penicillins Itching and Swelling    Has patient had a PCN reaction causing immediate rash, facial/tongue/throat swelling, SOB or lightheadedness with hypotension: yes Has patient had a PCN reaction causing severe rash involving mucus membranes or skin  necrosis: unknown Has patient had a PCN reaction that required hospitalization : no, dr came to the house Has patient had a PCN reaction occurring within the last 10 years: no If all of the above answers are "NO", then may proceed with Cephalosporin use.     Family History  Problem Relation Age of Onset  . Diabetes Mother   . Hypertension Mother   . Colon cancer Neg Hx   . Stomach cancer Neg Hx     Prior to Admission medications   Medication Sig Start Date End Date Taking? Authorizing Provider  ADULT TPN Home TPN from [facility] 40m bag, 4 nights week for 8 hours. Monday, Wednesday, Thursday and Fridays   Yes Historical Provider, MD  aspirin EC 81 MG tablet Take 1 tablet (81 mg total) by mouth daily. 11/17/13  Yes SLiliane Shi PA-C  Cholecalciferol (VITAMIN D-3 PO) Take 1 tablet by mouth daily.    Yes Historical Provider, MD  ciprofloxacin (CIPRO) 500 MG tablet Take 500 mg by mouth 2 (two) times daily.   Yes Historical Provider, MD  clobetasol ointment (TEMOVATE) 05.40% Apply 1 application topically 2 (two) times daily. Patient taking differently: Apply 1 application topically 2 (two) times daily as needed (around the pick for a rash/irritation).  03/21/14  Yes CGatha Mayer MD  diphenhydrAMINE (BENADRYL) 25 mg capsule Take 25 mg by mouth every 6 (six) hours as needed. For itching/sleep.   Yes Historical Provider, MD  diphenoxylate-atropine (LOMOTIL) 2.5-0.025 MG per tablet Take 2 tablets by mouth 1/2 hour before meals. Patient taking differently: Take 2 tablets by mouth 3 (three) times daily as needed for diarrhea or loose stools (half hour before meals).  07/21/13  Yes CGatha Mayer MD  loperamide (IMODIUM A-D) 2 MG tablet Take 2 tablets 30 minutes prior to each meal Patient taking differently: Take 4 mg by mouth 3 (three) times daily as needed for diarrhea or loose stools. Take 2 tablets 30 minutes prior to each meal 08/09/12  Yes CGatha Mayer MD  Multiple Vitamin  (MULTIVITAMIN WITH MINERALS) TABS Take 1 tablet by mouth daily.   Yes Historical Provider, MD  omeprazole (PRILOSEC) 40 MG capsule TAKE ONE CAPSULE BY MOUTH TWICE DAILY AT 10 AM AND 5 PM Patient taking differently: Take 40 mg by mouth 2 (two) times daily. TAKE ONE CAPSULE BY MOUTH TWICE DAILY AT 10 AM AND 5 PM 01/08/15  Yes CGatha Mayer MD  ondansetron (ZOFRAN) 4 MG tablet Take 1 tablet (4 mg total) by mouth every 8 (eight) hours as needed for nausea or vomiting. 02/02/15  Yes KMidge Minium MD  Teduglutide, rDNA, (GATTEX) 5 MG KIT Inject 3.3 Units into the skin daily. 09/11/14  Yes CGatha Mayer MD  VITAMIN E PO Take 1 tablet by mouth daily.    Yes Historical Provider, MD  pravastatin (PRAVACHOL)  20 MG tablet Take 1 tablet (20 mg total) by mouth every evening. Patient not taking: Reported on 01/17/2015 11/24/13   Liliane Shi, PA-C    Physical Exam: Filed Vitals:   05/16/15 1003 05/16/15 1443  BP: 127/67 130/63  Pulse: 64 55  Temp: 97.6 F (36.4 C) 97.9 F (36.6 C)  TempSrc: Oral Oral  Resp: 18 18  SpO2: 100% 100%    Physical Exam  Constitutional: Appears well-developed and well-nourished. No distress.  HENT: Normocephalic. External right and left ear normal. Dry MM Eyes: Conjunctivae and EOM are normal. PERRLA, no scleral icterus.  Neck: Normal ROM. Neck supple. No JVD. No tracheal deviation. No thyromegaly.  CVS: RRR, S1/S2 +, no gallops, no carotid bruit.  Pulmonary: Effort and breath sounds normal, no stridor, rhonchi, wheezes, rales.  Abdominal: Soft. BS +,  no distension, tenderness, rebound or guarding.  Musculoskeletal: Normal range of motion.  Lymphadenopathy: No lymphadenopathy noted, cervical, inguinal. Neuro: Alert. Normal reflexes, muscle tone coordination. No cranial nerve deficit. Skin: Skin is warm and dry. No rash noted. Not diaphoretic. No erythema. No pallor.  Psychiatric: Normal mood and affect. Behavior, judgment, thought content normal.   Labs on  Admission:  Basic Metabolic Panel:  Recent Labs Lab 05/16/15 1346  NA 141  K 4.4  CL 108  GLUCOSE 117*  BUN 24*  CREATININE 0.90   CBC:  Recent Labs Lab 05/16/15 1324 05/16/15 1346  WBC 4.6  --   NEUTROABS 2.2  --   HGB 10.1* 11.6*  HCT 31.4* 34.0*  MCV 97.2  --   PLT 97*  --    EKG: pending   If 7PM-7AM, please contact night-coverage [email or web address] Password [email or web address] 05/16/2015, 3:22 PM

## 2015-05-17 DIAGNOSIS — K559 Vascular disorder of intestine, unspecified: Secondary | ICD-10-CM | POA: Diagnosis present

## 2015-05-17 DIAGNOSIS — Z86010 Personal history of colonic polyps: Secondary | ICD-10-CM | POA: Diagnosis not present

## 2015-05-17 DIAGNOSIS — Z79899 Other long term (current) drug therapy: Secondary | ICD-10-CM | POA: Diagnosis not present

## 2015-05-17 DIAGNOSIS — M810 Age-related osteoporosis without current pathological fracture: Secondary | ICD-10-CM | POA: Diagnosis present

## 2015-05-17 DIAGNOSIS — Z87891 Personal history of nicotine dependence: Secondary | ICD-10-CM | POA: Diagnosis not present

## 2015-05-17 DIAGNOSIS — Z833 Family history of diabetes mellitus: Secondary | ICD-10-CM | POA: Diagnosis not present

## 2015-05-17 DIAGNOSIS — Z8249 Family history of ischemic heart disease and other diseases of the circulatory system: Secondary | ICD-10-CM | POA: Diagnosis not present

## 2015-05-17 DIAGNOSIS — Y848 Other medical procedures as the cause of abnormal reaction of the patient, or of later complication, without mention of misadventure at the time of the procedure: Secondary | ICD-10-CM | POA: Diagnosis present

## 2015-05-17 DIAGNOSIS — Z8711 Personal history of peptic ulcer disease: Secondary | ICD-10-CM | POA: Diagnosis not present

## 2015-05-17 DIAGNOSIS — Z91041 Radiographic dye allergy status: Secondary | ICD-10-CM | POA: Diagnosis not present

## 2015-05-17 DIAGNOSIS — R7881 Bacteremia: Secondary | ICD-10-CM | POA: Diagnosis not present

## 2015-05-17 DIAGNOSIS — Z452 Encounter for adjustment and management of vascular access device: Secondary | ICD-10-CM | POA: Diagnosis not present

## 2015-05-17 DIAGNOSIS — D696 Thrombocytopenia, unspecified: Secondary | ICD-10-CM | POA: Diagnosis present

## 2015-05-17 DIAGNOSIS — B9620 Unspecified Escherichia coli [E. coli] as the cause of diseases classified elsewhere: Secondary | ICD-10-CM | POA: Diagnosis present

## 2015-05-17 DIAGNOSIS — T80219A Unspecified infection due to central venous catheter, initial encounter: Secondary | ICD-10-CM | POA: Diagnosis not present

## 2015-05-17 DIAGNOSIS — E559 Vitamin D deficiency, unspecified: Secondary | ICD-10-CM | POA: Diagnosis present

## 2015-05-17 DIAGNOSIS — Z7982 Long term (current) use of aspirin: Secondary | ICD-10-CM | POA: Diagnosis not present

## 2015-05-17 DIAGNOSIS — I420 Dilated cardiomyopathy: Secondary | ICD-10-CM | POA: Diagnosis present

## 2015-05-17 DIAGNOSIS — D638 Anemia in other chronic diseases classified elsewhere: Secondary | ICD-10-CM | POA: Diagnosis present

## 2015-05-17 DIAGNOSIS — Z880 Allergy status to penicillin: Secondary | ICD-10-CM | POA: Diagnosis not present

## 2015-05-17 DIAGNOSIS — K912 Postsurgical malabsorption, not elsewhere classified: Secondary | ICD-10-CM | POA: Diagnosis present

## 2015-05-17 DIAGNOSIS — N179 Acute kidney failure, unspecified: Secondary | ICD-10-CM | POA: Diagnosis present

## 2015-05-17 LAB — CBC
HEMATOCRIT: 29.4 % — AB (ref 36.0–46.0)
HEMOGLOBIN: 9.4 g/dL — AB (ref 12.0–15.0)
MCH: 31.1 pg (ref 26.0–34.0)
MCHC: 32 g/dL (ref 30.0–36.0)
MCV: 97.4 fL (ref 78.0–100.0)
Platelets: 100 10*3/uL — ABNORMAL LOW (ref 150–400)
RBC: 3.02 MIL/uL — ABNORMAL LOW (ref 3.87–5.11)
RDW: 14.9 % (ref 11.5–15.5)
WBC: 4.3 10*3/uL (ref 4.0–10.5)

## 2015-05-17 LAB — BASIC METABOLIC PANEL
Anion gap: 8 (ref 5–15)
BUN: 15 mg/dL (ref 6–20)
CHLORIDE: 111 mmol/L (ref 101–111)
CO2: 21 mmol/L — ABNORMAL LOW (ref 22–32)
Calcium: 8.4 mg/dL — ABNORMAL LOW (ref 8.9–10.3)
Creatinine, Ser: 0.94 mg/dL (ref 0.44–1.00)
GFR calc Af Amer: >60 mL/min (ref >60)
GLUCOSE: 76 mg/dL (ref 65–99)
Potassium: 3.5 mmol/L (ref 3.5–5.1)
SODIUM: 140 mmol/L (ref 135–145)

## 2015-05-17 MED ORDER — PANTOPRAZOLE SODIUM 40 MG PO TBEC
40.0000 mg | DELAYED_RELEASE_TABLET | Freq: Two times a day (BID) | ORAL | Status: DC
  Administered 2015-05-17 – 2015-05-18 (×3): 40 mg via ORAL
  Filled 2015-05-17 (×4): qty 1

## 2015-05-17 MED ORDER — TEDUGLUTIDE (RDNA) 5 MG ~~LOC~~ KIT
3.3000 [IU] | PACK | Freq: Every day | SUBCUTANEOUS | Status: DC
  Administered 2015-05-17: 3.3 [IU] via SUBCUTANEOUS
  Filled 2015-05-17: qty 1

## 2015-05-17 MED ORDER — ADULT MULTIVITAMIN W/MINERALS CH
1.0000 | ORAL_TABLET | Freq: Every day | ORAL | Status: DC
  Administered 2015-05-17 – 2015-05-18 (×2): 1 via ORAL
  Filled 2015-05-17 (×2): qty 1

## 2015-05-17 MED ORDER — VITAMIN E 180 MG (400 UNIT) PO CAPS
400.0000 [IU] | ORAL_CAPSULE | Freq: Every day | ORAL | Status: DC
  Administered 2015-05-17 – 2015-05-18 (×2): 400 [IU] via ORAL
  Filled 2015-05-17 (×2): qty 1

## 2015-05-17 MED ORDER — VITAMIN D3 25 MCG (1000 UNIT) PO TABS
1000.0000 [IU] | ORAL_TABLET | Freq: Every day | ORAL | Status: DC
  Administered 2015-05-17 – 2015-05-18 (×2): 1000 [IU] via ORAL
  Filled 2015-05-17 (×2): qty 1

## 2015-05-17 NOTE — Progress Notes (Signed)
Patient ID: [name] [name], female   DOB: Nov 18, 1950, 65 y.o.   MRN: [id number]  [facility] PROGRESS NOTE  [name] [name] [id number] DOB: 1951-01-09 DOA: 05/16/2015 PCP: Annye Asa, MD   Brief narrative:    65 y.o. 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 She has seen Dr. Carlean Purl for evaluation of subjective fevers, chills, had blood cultures drawn on 1/3 which grew g- rods and pt was called to come to [facility] for admission and further evaluation. Pt came to ED and reported she has not had any specific concerns except intermittent fevers and chills but also said she feels much better today, no abd or urinary concerns, no chest pain, no appetite loss. ID doctor consulted but Dr. Lenna Sciara and [facility] asked to admit for further evaluation.   In ED, pt noted to be hemodynamically stable, VSS, blood work notable for Plt 97 and Cr 1.05. Medical bed requested. Of note, pt had PICC line replaced on 05/14/2014.   Assessment/Plan:    Active Problems:  Gram-negative bacteremia ([place]) - blood cultures from 1/3 still pending - repeat blood cultures requested on admission  - ID consulted, will follow up on recommendations in terms of ABX use, for now on Fortaz day #2 - monitor CBC - may need PICC line removal pending blood cultures   Thrombocytopenia - chronic, improving since admission  - no signs of bleeding  - CBC in AM  Acute kidney injury - appears to be pre renal  - resolved with IVF   Short bowel syndrome - per GI team - pt apparently on TPN and at time eating as well   DVT prophylaxis - Lovenox SQ  Code Status: Full.  Family Communication:  plan of care discussed with the patient Disposition Plan: Home when blood cultures back   IV access:  Peripheral IV  Procedures and diagnostic  studies:    Ir Fluoro Guide Cv Line Left 05/15/2015 Successful replacement of 26 cm tip to cuff tunneled single-lumen PICC with tip terminating within the right atrium. The catheter is ready for immediate use. Blood was aspirated and the tip of the existing PICC line and sent to the laboratory for culture analysis.   Medical Consultants:  ID  Other Consultants:  None  IAnti-Infectives:   Tressie Ellis 1/4 -->  Faye Ramsay, MD  [facility] Pager [phone]  If 7PM-7AM, please contact night-coverage [email or web address] Password [email or web address] 05/17/2015, 9:51 AM     HPI/Subjective: No events overnight.   Objective: Filed Vitals:   05/16/15 1443 05/16/15 1707 05/16/15 2200 05/17/15 0600  BP: 130/63  128/94 133/64  Pulse: 55  52 73  Temp: 97.9 F (36.6 C)  98.8 F (37.1 C) 98.3 F (36.8 C)  TempSrc: Oral  Oral Oral  Resp: 18  19 17   Height:  5\' 8"  (1.727 m)    Weight:  62.596 kg (138 lb)    SpO2: 100%  98% 96%    Intake/Output Summary (Last 24 hours) at 05/17/15 0951 Last data filed at 05/17/15 [id number]  Gross per 24 hour  Intake    240 ml  Output    300 ml  Net    -60 ml    Exam:   General:  Pt is alert, follows commands appropriately, not in acute distress  Cardiovascular: Regular rate and [name], no rubs, no gallops  Respiratory:  Clear to auscultation bilaterally, no wheezing, no crackles, no rhonchi  Abdomen: Soft, non tender, non distended, bowel sounds present, no guarding  Extremities: No edema, pulses DP and PT palpable bilaterally  Neuro: Grossly nonfocal  Data Reviewed: Basic Metabolic Panel:  Recent Labs Lab 05/16/15 1324 05/16/15 1346 05/17/15 0453  NA 138 141 140  K 4.4 4.4 3.5  CL 108 108 111  CO2 23  --  21*  GLUCOSE 118* 117* 76  BUN 18 24* 15  CREATININE 1.05* 0.90 0.94  CALCIUM 8.3*  --  8.4*  MG 1.9  --   --   PHOS 3.9  --   --    Liver Function Tests:  Recent Labs Lab 05/16/15 1324  AST 28  ALT 22  ALKPHOS 90  BILITOT 1.0  PROT 7.2   ALBUMIN 3.1*   CBC:  Recent Labs Lab 05/16/15 1324 05/16/15 1346 05/17/15 0453  WBC 4.6  --  4.3  NEUTROABS 2.2  --   --   HGB 10.1* 11.6* 9.4*  HCT 31.4* 34.0* 29.4*  MCV 97.2  --  97.4  PLT 97*  --  100*    Recent Results (from the past 240 hour(s))  Cath Tip Culture     Status: None (Preliminary result)   Collection Time: 05/15/15  3:18 PM  Result Value Ref Range Status   Specimen Description URINE, CATHETERIZED CATHETER TIP CULTURE  Final   Special Requests NONE  Final   Culture   Final    NO GROWTH 2 DAYS Performed at [facility]    Report Status PENDING  Incomplete  Culture, blood (Routine X 2) w Reflex to ID Panel     Status: None (Preliminary result)   Collection Time: 05/15/15  3:20 PM  Result Value Ref Range Status   Specimen Description BLOOD PICC LINE  Final   Special Requests BOTTLES DRAWN AEROBIC AND ANAEROBIC 10 CC EA  Final   Culture  Setup Time   Final    GRAM NEGATIVE RODS IN BOTH AEROBIC AND ANAEROBIC BOTTLES CRITICAL RESULT CALLED TO, READ BACK BY AND VERIFIED WITH: DR. Ardis Hughs AT [place] [id number] BY Rhea Bleacher    Culture   Final    ESCHERICHIA COLI Performed at [facility]    Report Status PENDING  Incomplete  Culture, blood (Routine X 2) w Reflex to ID Panel     Status: None (Preliminary result)   Collection Time: 05/15/15  3:40 PM  Result Value Ref Range Status   Specimen Description BLOOD RIGHT ARM  Final   Special Requests BOTTLES DRAWN AEROBIC AND ANAEROBIC 10 CC EA  Final   Culture  Setup Time   Final    GRAM NEGATIVE RODS IN BOTH AEROBIC AND ANAEROBIC BOTTLES CRITICAL RESULT CALLED TO, READ BACK BY AND VERIFIED WITH: DR. Ardis Hughs AT [id number] ON [id number] BY Rhea Bleacher    Culture   Final    ESCHERICHIA COLI Performed at [facility]    Report Status PENDING  Incomplete     Scheduled Meds: . antiseptic oral rinse  7 mL Mouth Rinse q12n4p  . cefTAZidime (FORTAZ)  IV  2 g Intravenous 3 times per day  .  chlorhexidine  15 mL Mouth Rinse BID  . enoxaparin (LOVENOX) injection  40 mg Subcutaneous Q24H  . Teduglutide (rDNA)  3.3 Units Subcutaneous Daily   Continuous Infusions:

## 2015-05-17 NOTE — Progress Notes (Signed)
[place] for Infectious Disease    Date of Admission:  05/16/2015   Total days of antibiotics 2        Day 2 ceftaz           ID: [name] [name] is a 65 y.o. female with  Active Problems:   Gram-negative bacteremia ([name] [name])   PICC line infection    Subjective: Remains afebrile  Interval hx: ecoli identified as GNR from blood cx, sensitivities still pending. She had been on cipro since 12/28 up to hospitalization for bacterial overgrowth.   Medications:  . antiseptic oral rinse  7 mL Mouth Rinse q12n4p  . cefTAZidime (FORTAZ)  IV  2 g Intravenous 3 times per day  . chlorhexidine  15 mL Mouth Rinse BID  . cholecalciferol  1,000 Units Oral Daily  . enoxaparin (LOVENOX) injection  40 mg Subcutaneous Q24H  . multivitamin with minerals  1 tablet Oral Daily  . pantoprazole  40 mg Oral BID  . Teduglutide (rDNA)  3.3 Units Subcutaneous Daily  . vitamin E  400 Units Oral Daily    Objective: Vital signs in last 24 hours: Temp:  [98.3 F (36.8 C)-98.8 F (37.1 C)] 98.7 F (37.1 C) (01/05 1421) Pulse Rate:  [47-73] 47 (01/05 1421) Resp:  [17-19] 17 (01/05 1421) BP: (124-133)/(57-94) 124/57 mmHg (01/05 1421) SpO2:  [96 %-98 %] 98 % (01/05 1421) Weight:  [138 lb (62.596 kg)] 138 lb (62.596 kg) (01/04 1707) Physical Exam  Constitutional:  oriented to person, place, and time. appears well-developed and well-nourished. No distress.  HENT: [name] [name]/AT, PERRLA, no scleral icterus Mouth/Throat: Oropharynx is clear and moist. No oropharyngeal exudate.  Cardiovascular: Normal rate, regular rhythm and normal heart sounds. Exam reveals no gallop and no friction rub.  No murmur heard.  Pulmonary/Chest: Effort normal and breath sounds normal. No respiratory distress.  has no wheezes.  Chest = left subclavian line in place Neck = supple, no nuchal rigidity Abdominal: Soft. Bowel sounds are normal.  exhibits no distension. There is no tenderness.  Lymphadenopathy: no cervical adenopathy. No  axillary adenopathy Neurological: alert and oriented to person, place, and time.  Skin: Skin is warm and dry. No rash noted. No erythema.  Psychiatric: a normal mood and affect.  behavior is normal.   Lab Results  Recent Labs  05/16/15 1324 05/16/15 1346 05/17/15 0453  WBC 4.6  --  4.3  HGB 10.1* 11.6* 9.4*  HCT 31.4* 34.0* 29.4*  NA 138 141 140  K 4.4 4.4 3.5  CL 108 108 111  CO2 23  --  21*  BUN 18 24* 15  CREATININE 1.05* 0.90 0.94   Liver Panel  Recent Labs  05/16/15 1324  PROT 7.2  ALBUMIN 3.1*  AST 28  ALT 22  ALKPHOS 90  BILITOT 1.0    Microbiology: 1/5 blood cx pending 1/3 blood cx ecoli, sensi pending  Assessment/Plan: E.coli bacteremia, central line associated = central line has been removed, but a new line was switched over guide wire. We will see if repeat cx grow GNR to decide if line needs to be replaced. For now patient is on ceftaz, awaiting sensitivities. Will treat for a total of 14 days using day of clearance as day 1. i suspect since she was on cipro at the time of her symptoms that the e.coli will be FQ resistant  Short gut syndrome = continue with current management  [name] [name] [name] [name] [facility] Cell: [phone] Pager: [phone]  05/17/2015, 4:50 PM

## 2015-05-17 NOTE — Progress Notes (Signed)
Spoke with Toma Aran, RN from [facility]. Questions regarding home care.  Faxed information to floor for case management for discharge purposes.  Case management notified

## 2015-05-18 LAB — CULTURE, BLOOD (ROUTINE X 2)

## 2015-05-18 LAB — CATH TIP CULTURE: CULTURE: NO GROWTH

## 2015-05-18 MED ORDER — SODIUM CHLORIDE 0.9 % IJ SOLN
10.0000 mL | INTRAMUSCULAR | Status: DC | PRN
  Administered 2015-05-18: 10 mL

## 2015-05-18 MED ORDER — DEXTROSE 5 % IV SOLN: 2.0000 g | INTRAVENOUS | Status: DC

## 2015-05-18 MED ORDER — DEXTROSE 5 % IV SOLN
2.0000 g | INTRAVENOUS | Status: DC
  Administered 2015-05-18: 2 g via INTRAVENOUS
  Filled 2015-05-18: qty 2

## 2015-05-18 MED ORDER — HEPARIN SOD (PORK) LOCK FLUSH 100 UNIT/ML IV SOLN
250.0000 [IU] | INTRAVENOUS | Status: AC | PRN
  Administered 2015-05-18: 250 [IU]

## 2015-05-18 NOTE — Discharge Summary (Signed)
Physician Discharge Summary  [name] [name] [id number] DOB: 11-25-1950 DOA: 05/16/2015  PCP: Annye Asa, MD  Admit date: 05/16/2015 Discharge date: 05/18/2015  Recommendations for Outpatient Follow-up:  1. Pt will need to follow up with PCP in 1-2 weeks post discharge 2. Please obtain BMP to evaluate electrolytes and kidney function 3. Please also check CBC to evaluate Hg and Hct levels 4. Pt needs to continue taking Rocephin 2 gm IV QD until January 19th, 2017 per Dr. Baxter Flattery recommendations 5. Please note that repeat blood culture from 05/17/2015 still pending on discharge and needs to be followed up on   Discharge Diagnoses:  Active Problems:   E. Coli bacteremia ([place])   PICC line infection  Discharge Condition: Stable  Diet recommendation: Heart healthy diet discussed in details    Brief narrative:    65 y.o. 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 She has seen Dr. Carlean Purl for evaluation of subjective fevers, chills, had blood cultures drawn on 1/3 which grew g- rods and pt was called to come to [facility] for admission and further evaluation. Pt came to ED and reported she has not had any specific concerns except intermittent fevers and chills but also said she feels much better today, no abd or urinary concerns, no chest pain, no appetite loss. ID doctor consulted but Dr. Lenna Sciara and [facility] asked to admit for further evaluation.   In ED, pt noted to be hemodynamically stable, VSS, blood work notable for Plt 97 and Cr 1.05. Medical bed requested. Of note, pt had PICC line replaced on 05/14/2014.   Assessment/Plan:    Active Problems:  E. Coli bacteremia ([place]) - blood cultures from 1/3 with E. Coli and based on sensitivity report, ABX changed to Rocephin - pt to continue taking until January  19th, 2017 - repeat blood cultures requested on admission and pending on discharge   Thrombocytopenia - chronic, improving since admission  - no signs of bleeding   Acute kidney injury - appears to be pre renal  - resolved with IVF   Short bowel syndrome - per GI team - pt apparently on TPN and at time eating as well   Code Status: Full.  Family Communication: plan of care discussed with the patient Disposition Plan: Home   IV access:  PICC   Procedures and diagnostic studies:   Ir Fluoro Guide Cv Line Left 05/15/2015 Successful replacement of 26 cm tip to cuff tunneled single-lumen PICC with tip terminating within the right atrium. The catheter is ready for immediate use. Blood was aspirated and the tip of the existing PICC line and sent to the laboratory for culture analysis.   Medical Consultants:  ID  Other Consultants:  None  IAnti-Infectives:   Tressie Ellis 1/4 --> 1/6 Rocephin 1/6 --> 1/19      Discharge Exam: Filed Vitals:   05/17/15 2100 05/18/15 0458  BP: 143/69 136/65  Pulse: 51 49  Temp: 98.3 F (36.8 C) 97.8 F (36.6 C)  Resp: 16 14   Filed Vitals:   05/17/15 0600 05/17/15 1421 05/17/15 2100 05/18/15 0458  BP: 133/64 124/57 143/69 136/65  Pulse: 73 47 51 49  Temp: 98.3 F (36.8 C) 98.7 F (37.1 C) 98.3 F (36.8 C) 97.8 F (36.6 C)  TempSrc: Oral Oral Oral Oral  Resp: _0 Height:  Weight:      SpO2: 96% 98% 98% 97%    General: Pt is alert, follows commands appropriately, not in acute distress Cardiovascular: Regular rate and rhythm, no rubs, no gallops Respiratory: Clear to auscultation bilaterally, no wheezing, no crackles, no rhonchi Abdominal: Soft, non tender, non distended, bowel sounds +, no guarding  Discharge Instructions  Discharge Instructions    Diet - low sodium heart healthy    Complete by:  As directed      Increase activity slowly    Complete by:  As directed             Medication  List    STOP taking these medications        pravastatin 20 MG tablet  Commonly known as:  PRAVACHOL      TAKE these medications        ADULT TPN  Home TPN from [facility]: 1800 ml bag, 4 nights week for 8 hours  (includes 1 hour taper up and down).  Monday, Wednesday, Thursday and Fridays.     aspirin EC 81 MG tablet  Take 1 tablet (81 mg total) by mouth daily.     BENADRYL 25 mg capsule  Generic drug:  diphenhydrAMINE  Take 25 mg by mouth every 6 (six) hours as needed. For itching/sleep.     clobetasol ointment 0.05 %  Commonly known as:  TEMOVATE  Apply 1 application topically 2 (two) times daily.     diphenoxylate-atropine 2.5-0.025 MG tablet  Commonly known as:  LOMOTIL  Take 2 tablets by mouth 1/2 hour before meals.     loperamide 2 MG tablet  Commonly known as:  IMODIUM A-D  Take 2 tablets 30 minutes prior to each meal     multivitamin with minerals Tabs tablet  Take 1 tablet by mouth daily.     omeprazole 40 MG capsule  Commonly known as:  PRILOSEC  TAKE ONE CAPSULE BY MOUTH TWICE DAILY AT 10 AM AND 5 PM     ondansetron 4 MG tablet  Commonly known as:  ZOFRAN  Take 1 tablet (4 mg total) by mouth every 8 (eight) hours as needed for nausea or vomiting.     Teduglutide (rDNA) 5 MG Kit  Commonly known as:  GATTEX  Inject 3.3 Units into the skin daily.     VITAMIN D-3 PO  Take 1 tablet by mouth daily.     VITAMIN E PO  Take 1 tablet by mouth daily.      ASK your doctor about these medications        ciprofloxacin 500 MG tablet  Commonly known as:  CIPRO  Take 500 mg by mouth 2 (two) times daily.           Follow-up Information    Follow up with Annye Asa, MD.   Specialty:  Family Medicine   Contact information:   [address] [place] [address] [phone]       Call Faye Ramsay, MD.   Specialty:  Internal Medicine   Why:  As needed, call my cell phone [phone]    Contact information:   [address] [place] [address] [phone]        The results of significant diagnostics from this hospitalization (including imaging, microbiology, ancillary and laboratory) are listed below for reference.     Microbiology: Recent Results (from the past 240 hour(s))  Cath Tip Culture     Status:  None   Collection Time: 05/15/15  3:18 PM  Result Value Ref Range Status   Specimen Description URINE, CATHETERIZED CATHETER TIP CULTURE  Final   Special Requests NONE  Final   Culture   Final    NO GROWTH 2 DAYS Performed at [facility]    Report Status 05/18/2015 FINAL  Final  Culture, blood (Routine X 2) w Reflex to ID Panel     Status: None (Preliminary result)   Collection Time: 05/15/15  3:20 PM  Result Value Ref Range Status   Specimen Description BLOOD PICC LINE  Final   Special Requests BOTTLES DRAWN AEROBIC AND ANAEROBIC 10 CC EA  Final   Culture  Setup Time   Final    GRAM NEGATIVE RODS IN BOTH AEROBIC AND ANAEROBIC BOTTLES CRITICAL RESULT CALLED TO, READ BACK BY AND VERIFIED WITH: DR. Ardis Hughs AT [place] [id number] BY Rhea Bleacher    Culture   Final    ESCHERICHIA COLI Performed at [facility]    Report Status PENDING  Incomplete  Culture, blood (Routine X 2) w Reflex to ID Panel     Status: None (Preliminary result)   Collection Time: 05/15/15  3:40 PM  Result Value Ref Range Status   Specimen Description BLOOD RIGHT ARM  Final   Special Requests BOTTLES DRAWN AEROBIC AND ANAEROBIC 10 CC EA  Final   Culture  Setup Time   Final    GRAM NEGATIVE RODS IN BOTH AEROBIC AND ANAEROBIC BOTTLES CRITICAL RESULT CALLED TO, READ BACK BY AND VERIFIED WITH: DR. Ardis Hughs AT [id number] ON [id number] BY Rhea Bleacher    Culture   Final    ESCHERICHIA COLI Performed at [facility]    Report Status PENDING  Incomplete     Labs: Basic Metabolic Panel:  Recent Labs Lab 05/16/15 1324 05/16/15 1346 05/17/15 0453  NA 138 141 140  K 4.4 4.4 3.5  CL 108  108 111  CO2 23  --  21*  GLUCOSE 118* 117* 76  BUN 18 24* 15  CREATININE 1.05* 0.90 0.94  CALCIUM 8.3*  --  8.4*  MG 1.9  --   --   PHOS 3.9  --   --    Liver Function Tests:  Recent Labs Lab 05/16/15 1324  AST 28  ALT 22  ALKPHOS 90  BILITOT 1.0  PROT 7.2  ALBUMIN 3.1*   No results for input(s): LIPASE, AMYLASE in the last 168 hours. No results for input(s): AMMONIA in the last 168 hours. CBC:  Recent Labs Lab 05/16/15 1324 05/16/15 1346 05/17/15 0453  WBC 4.6  --  4.3  NEUTROABS 2.2  --   --   HGB 10.1* 11.6* 9.4*  HCT 31.4* 34.0* 29.4*  MCV 97.2  --  97.4  PLT 97*  --  100*   Cardiac Enzymes: No results for input(s): CKTOTAL, CKMB, CKMBINDEX, TROPONINI in the last 168 hours. BNP: BNP (last 3 results) No results for input(s): BNP in the last 8760 hours.  ProBNP (last 3 results) No results for input(s): PROBNP in the last 8760 hours.  CBG: No results for input(s): GLUCAP in the last 168 hours.   SIGNED: Time coordinating discharge: Over 30 minutes  Faye Ramsay, MD  [facility] 05/18/2015, 9:53 AM Pager [phone]  If 7PM-7AM, please contact night-coverage [email or web address] Password [email or web address]

## 2015-05-18 NOTE — Progress Notes (Signed)
ecoli is R cipro but sensitive to ceftriaxone. Will change to ceftriaxone 2gm IV daily, would treat for a total of 14 day using 1/5 as day 1. End date of 1/19. Give first dose in hospital to see that she tolerates. We will d/c ceftaz

## 2015-05-18 NOTE — Discharge Instructions (Signed)
Bacteremia °Bacteremia is the presence of bacteria in the blood. A small amount of bacteria may not cause any symptoms. °Sometimes, the bacteria spread and cause infection in other parts of the body, such as the heart, joints, bones, or brain. Having a great amount of bacteria can cause a serious, sometimes life-threatening infection called sepsis. °CAUSES °This condition is caused by bacteria that get into the blood. Bacteria can enter the blood: °· During a dental or medical procedure. °· After you brush your teeth so hard that the gums bleed. °· Through a scrape or cut on your skin. °More severe types of bacteremia can be caused by: °· A bacterial infection, such as pneumonia, that spreads to the blood. °· Using a dirty needle. °RISK FACTORS °This condition is more likely to develop in: °· Children and elderly adults. °· People who have a long-lasting (chronic) disease or medical condition. °· People who have an artificial joint or heart valve. °· People who have heart valve disease. °· People who have a tube, such as a catheter or IV tube, that has been inserted for a medical treatment. °· People who have a weak body defense system (immune system). °· People who use IV drugs. °SYMPTOMS °Usually, this condition does not cause symptoms when it is mild. When it is more serious, it may cause: °· Fever. °· Chills. °· Racing heart. °· Shortness of breath. °· Dizziness. °· Weakness. °· Confusion. °· Nausea or vomiting. °· Diarrhea. °Bacteremia that has spread to other parts of the body may cause symptoms in those areas. °DIAGNOSIS °This condition may be diagnosed with a physical exam and tests, such as: °· A complete blood count (CBC). This test looks for signs of infection. °· Blood cultures. These look for bacteria in your blood. °· Tests of any IV tubes. These look for a source of infection. °· Urine tests. °· Imaging tests, such as an X-ray, CT scan, MRI, or heart ultrasound. °TREATMENT °If the condition is mild,  treatment is usually not needed. Usually, the body's immune system will remove the bacteria. If the condition is more serious, it may be treated with: °· Antibiotic medicines through an IV tube. These may be given for about 2 weeks. At first, the antibiotic that is given may kill most types of blood bacteria. If your test results show that a certain kind of bacteria is causing problems, the antibiotic may be changed to kill only the bacteria that are causing problems. °· Antibiotics taken by mouth. °· Removing any catheter or IV tube that is a source of infection. °· Blood pressure and breathing support, if needed. °· Surgery to control the source or spread of infection, if needed. °HOME CARE INSTRUCTIONS °· Take over-the-counter and prescription medicines only as told by your health care provider. °· If you were prescribed an antibiotic, take it as told by your health care provider. Do not stop taking the antibiotic even if you start to feel better. °· Rest at home until your condition is under control. °· Drink enough fluid to keep your urine clear or pale yellow. °· Keep all follow-up visits as told by your health care provider. This is important. °PREVENTION °Take these actions to help prevent future episodes of bacteremia: °· Get all vaccinations as recommended by your health care provider. °· Clean and cover scrapes or cuts. °· Bathe regularly. °· Wash your hands often. °· Before any dental or surgical procedure, ask your health care provider if you should take an antibiotic. °SEEK MEDICAL   CARE IF: °· Your symptoms get worse. °· You continue to have symptoms after treatment. °· You develop new symptoms after treatment. °SEEK IMMEDIATE MEDICAL CARE IF: °· You have chest pain or trouble breathing. °· You develop confusion, dizziness, or weakness. °· You develop pale skin. °  °This information is not intended to replace advice given to you by your health care provider. Make sure you discuss any questions you have  with your health care provider. °  °Document Released: 02/09/2006 Document Revised: 01/17/2015 Document Reviewed: 07/01/2014 °Elsevier Interactive Patient Education ©2016 [employer] ° °

## 2015-05-22 ENCOUNTER — Telehealth: Payer: Self-pay | Admitting: Internal Medicine

## 2015-05-22 ENCOUNTER — Other Ambulatory Visit: Payer: Self-pay | Admitting: *Deleted

## 2015-05-22 ENCOUNTER — Telehealth: Payer: Self-pay | Admitting: *Deleted

## 2015-05-22 LAB — CULTURE, BLOOD (ROUTINE X 2)
CULTURE: NO GROWTH
Culture: NO GROWTH

## 2015-05-22 MED ORDER — ONDANSETRON 8 MG PO TBDP: 8.0000 mg | ORAL_TABLET | Freq: Three times a day (TID) | ORAL | Status: DC | PRN

## 2015-05-22 NOTE — Telephone Encounter (Signed)
Can you call in zofran 8mg  ODT to take 30-45 min before she infuses ceftriaxone. She has diarrhea at baseline due to short gut. More concerned about her N/V. If zofran doesn't work after 2-3d, we will switch her back to [place]

## 2015-05-22 NOTE — Telephone Encounter (Signed)
Patient called c/o side effects from IV meds, nausea, vomiting and constant diarrhea. She has not been seen in clinic only in the hospital. She said she does not have a home health agency, but receives her meds through [facility] [phone]. Please advise

## 2015-05-22 NOTE — Telephone Encounter (Signed)
Antibiotics were prescribed by ID.  Patient is provided the number to ID and she is asked to contact them about side effects.

## 2015-05-22 NOTE — Telephone Encounter (Signed)
Rx sent and patient informed.  

## 2015-05-23 ENCOUNTER — Telehealth: Payer: Self-pay

## 2015-05-23 NOTE — Telephone Encounter (Signed)
PCP: Annye Asa, MD  Admit date: 05/16/2015 Discharge date: 05/18/2015  Recommendations for Outpatient Follow-up:  1. Pt will need to follow up with PCP in 1-2 weeks post discharge 2. Please obtain BMP to evaluate electrolytes and kidney function 3. Please also check CBC to evaluate Hg and Hct levels 4. Pt needs to continue taking Rocephin 2 gm IV QD until January 19th, 2017 per Dr. Baxter Flattery recommendations 5. Please note that repeat blood culture from 05/17/2015 still pending on discharge and needs to be followed up on  Discharge Diagnoses:  Active Problems:  E. Coli bacteremia ([place])  PICC line infection  Discharge Condition: Stable  Diet recommendation: Heart healthy diet   Called to follow up with patient.  Pt states she's doing well.  No complaints at this time.  She is taking Ceftriaxone as prescribed.  States antibiotics was causing n/v.  She called Dr. Baxter Flattery yesterday, who increased Zofran to 8 mg.  Pt states she's feeling better now, but still does not have much of an appetite.  Otherwise, she is able to perform ADLs independently and does not have any concerns at this time.  She was encouraged to call office with questions or concerns.    Hospital follow up appt scheduled 05/30/15 @ 11 am.

## 2015-05-28 ENCOUNTER — Other Ambulatory Visit (INDEPENDENT_AMBULATORY_CARE_PROVIDER_SITE_OTHER): Payer: Medicare Other

## 2015-05-28 DIAGNOSIS — K912 Postsurgical malabsorption, not elsewhere classified: Secondary | ICD-10-CM

## 2015-05-28 LAB — CBC WITH DIFFERENTIAL/PLATELET
BASOS ABS: 0 10*3/uL (ref 0.0–0.1)
Basophils Relative: 0.5 % (ref 0.0–3.0)
Eosinophils Absolute: 0.4 10*3/uL (ref 0.0–0.7)
Eosinophils Relative: 7.9 % — ABNORMAL HIGH (ref 0.0–5.0)
HEMATOCRIT: 36.3 % (ref 36.0–46.0)
HEMOGLOBIN: 11.7 g/dL — AB (ref 12.0–15.0)
LYMPHS PCT: 35.5 % (ref 12.0–46.0)
Lymphs Abs: 1.9 10*3/uL (ref 0.7–4.0)
MCHC: 32.2 g/dL (ref 30.0–36.0)
MCV: 96.3 fl (ref 78.0–100.0)
MONOS PCT: 8 % (ref 3.0–12.0)
Monocytes Absolute: 0.4 10*3/uL (ref 0.1–1.0)
NEUTROS ABS: 2.6 10*3/uL (ref 1.4–7.7)
Neutrophils Relative %: 48.1 % (ref 43.0–77.0)
Platelets: 150 10*3/uL (ref 150.0–400.0)
RBC: 3.77 Mil/uL — AB (ref 3.87–5.11)
RDW: 14.7 % (ref 11.5–15.5)
WBC: 5.5 10*3/uL (ref 4.0–10.5)

## 2015-05-28 LAB — COMPREHENSIVE METABOLIC PANEL
ALK PHOS: 97 U/L (ref 39–117)
ALT: 17 U/L (ref 0–35)
AST: 17 U/L (ref 0–37)
Albumin: 3.7 g/dL (ref 3.5–5.2)
BILIRUBIN TOTAL: 0.3 mg/dL (ref 0.2–1.2)
BUN: 16 mg/dL (ref 6–23)
CALCIUM: 9.4 mg/dL (ref 8.4–10.5)
CO2: 21 meq/L (ref 19–32)
Chloride: 104 mEq/L (ref 96–112)
Creatinine, Ser: 0.91 mg/dL (ref 0.40–1.20)
GFR: 65.93 mL/min (ref >60.00)
Glucose, Bld: 89 mg/dL (ref 70–99)
Potassium: 3.9 mEq/L (ref 3.5–5.1)
Sodium: 138 mEq/L (ref 135–145)
Total Protein: 8.5 g/dL — ABNORMAL HIGH (ref 6.0–8.3)

## 2015-05-28 LAB — MAGNESIUM: MAGNESIUM: 1.9 mg/dL (ref 1.5–2.5)

## 2015-05-28 LAB — BILIRUBIN, DIRECT: Bilirubin, Direct: 0.1 mg/dL (ref 0.0–0.3)

## 2015-05-28 LAB — C-REACTIVE PROTEIN: CRP: 0.4 mg/dL — ABNORMAL LOW (ref 0.5–20.0)

## 2015-05-28 LAB — TRIGLYCERIDES: TRIGLYCERIDES: 217 mg/dL — AB (ref 0.0–149.0)

## 2015-05-28 LAB — PHOSPHORUS: PHOSPHORUS: 3.9 mg/dL (ref 2.3–4.6)

## 2015-05-29 LAB — PREALBUMIN: PREALBUMIN: 24 mg/dL (ref 17–34)

## 2015-05-29 NOTE — Progress Notes (Signed)
Quick Note:  Overall labs good Please send to TPN pharmacist ______

## 2015-05-30 ENCOUNTER — Encounter: Payer: Self-pay | Admitting: Family Medicine

## 2015-05-30 ENCOUNTER — Ambulatory Visit (INDEPENDENT_AMBULATORY_CARE_PROVIDER_SITE_OTHER): Payer: Medicare Other | Admitting: Family Medicine

## 2015-05-30 VITALS — BP 104/62 | Temp 98.4°F | Ht 68.0 in | Wt 136.0 lb

## 2015-05-30 DIAGNOSIS — T827XXD Infection and inflammatory reaction due to other cardiac and vascular devices, implants and grafts, subsequent encounter: Secondary | ICD-10-CM

## 2015-05-30 DIAGNOSIS — R7881 Bacteremia: Secondary | ICD-10-CM

## 2015-05-30 MED ORDER — CLOBETASOL PROPIONATE 0.05 % EX OINT: 1.0000 "application " | TOPICAL_OINTMENT | Freq: Two times a day (BID) | CUTANEOUS | Status: DC | PRN

## 2015-05-30 NOTE — Patient Instructions (Signed)
Follow up as needed No med changes at this time Use the Clobetasol twice daily as needed Call with any questions or concerns Happy New Year!!!

## 2015-05-30 NOTE — Progress Notes (Signed)
Pre visit review using our clinic review tool, if applicable. No additional management support is needed unless otherwise documented below in the visit note. 

## 2015-05-30 NOTE — Assessment & Plan Note (Signed)
Pt has hx of similar due to chronic PICC.  Pt is finishing course of IV abx tomorrow.  PICC site was not changed this time.  She has ID f/u.  She had labs done earlier this week as part of routine TPN labs- no need to repeat.  Other than GI sxs from abx, pt is currently asymptomatic.  Reviewed supportive care and red flags that should prompt return.  Pt expressed understanding and is in agreement w/ plan.

## 2015-05-30 NOTE — Progress Notes (Signed)
   Subjective:    Patient ID: [name] [name], female    DOB: [birth year], 65 y.o.   MRN: [id number]  [facility] f/u- pt was admitted 1/4-6 for E coli bacteremia after PICC was exchanged.  PICC was not used during hospitalization but is now being used for her TPN and Rocephin.  She was d/c'd w/ 2gm IV Rocephin via PICC to end 1/19.  F/u blood cx was negative.  D/C summary recommended f/u BMP, CBC but pt had this done 1/16 for TPN labs.  Pt reports feeling 'ok'.  Better than previous but excited to stop abx tomorrow due to GI side effects.  No fevers, chills, sweats.  + N/V due to abx- taking Zofran prior to infusion.  Has f/u w/ Dr Baxter Flattery.  Pt needs refill on Clobetasol due to adhesive reaction around stat lock.     Review of Systems For ROS see HPI     Objective:   Physical Exam  Constitutional: She is oriented to person, place, and time. She appears well-developed and well-nourished. No distress.  HENT:  Head: Normocephalic and atraumatic.  Eyes: Conjunctivae and EOM are normal. Pupils are equal, round, and reactive to light.  Neck: Normal range of motion. Neck supple. No thyromegaly present.  Cardiovascular: Normal rate, regular rhythm, normal heart sounds and intact distal pulses.   No murmur heard. Pulmonary/Chest: Effort normal and breath sounds normal. No respiratory distress.  Abdominal: Soft. She exhibits no distension. There is no tenderness.  Musculoskeletal: She exhibits no edema.  Lymphadenopathy:    She has no cervical adenopathy.  Neurological: She is alert and oriented to person, place, and time.  Skin: Skin is warm and dry. No erythema (picc site w/o evidence of infection).  Psychiatric: She has a normal mood and affect. Her behavior is normal.  Vitals reviewed.         Assessment & Plan:

## 2015-06-05 ENCOUNTER — Encounter: Payer: Self-pay | Admitting: Cardiovascular Disease

## 2015-06-14 NOTE — Progress Notes (Signed)
Patient ID: [name] [name], female   DOB: 01-30-1951, 65 y.o.   MRN: [id number]   Cardiology Office Note    Date:  06/15/2015   ID:  [name] [name], DOB 1950/07/21, MRN [id number]  PCP:  Annye Asa, MD  Cardiologist:  Dr. Jenkins Rouge      History of Present Illness: [name] [name] is a 65 y.o. 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  She is followed here by Dr. Carlean Purl and at [facility] with Dr. Clementeen Graham (has been placed on Gattex). I have not seen her since 2013  Seen by PA 7/15 With Dr Ron Parker.    Echo 5/15  Improved EF Study Conclusions  - Left ventricle: Inferior and apical hypokinesis. The cavity size was mildly dilated. Wall thickness was normal. The estimated ejection fraction was 45%. - Aortic valve: Valve area (VTI): 2.28 cm^2. Valve area (Vmax): 2.4 cm^2. - Mitral valve: There was mild regurgitation. - Atrial septum: No defect or patent foramen ovale was identified.  ------------------------------------------------------------------- Labs, prior tests, procedures, and surgery: Echocardiography (August 2013).   EF was 30%.  Myovue 9/15 with old inferior apical scar no ischemia  EF  47%   No further w/u deemed necessary by PA at time    Her imdur and coreg had been stopped  The former not thought to be doing any good and latter as she had first degree block and resting HR in 60's   Carotids updated 9/15 40-59% RICA stenosis.  F/U duplex in 1 year.  She feels great TPN 3x/week and iv fluid 1x/week Has been off anticoagulation for 3 years and it is not clear to me why   Recent admission for  ecoli bacteremia 05/2015  Antibiotics via PIC line  Which was replaced    Recent  Labs: 05/28/2015: ALT 17; Creatinine, Ser 0.91; Hemoglobin 11.7*; Potassium 3.9  Wt Readings from Last 3 Encounters:  06/15/15 63.231 kg (139 lb 6.4 oz)  05/30/15 61.689 kg (136 lb)  05/16/15 62.596 kg (138 lb)     Past Medical History  Diagnosis Date  . 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 Abnormal LFTs   . Vitamin D deficiency   . Personal history of colonic polyps   . Thrombophilia ([place])   . Diabetes mellitus     resolved after weight loss  . Obesity     prior to weight loss  . Allergic rhinosinusitis   . At risk for dental problems   . Fracture of left clavicle   . Osteoporosis   . Anemia of chronic disease   . Renal insufficiency   . Atypical nevus   . 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 Pancytopenia 10/07/2011  . 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 Bacterial overgrowth syndrome   . Splenomegaly     By ultrasound  . 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 Carotid stenosis     Carotid US (9/15):  R 40-59%; L 1-39% >> FU 1 year  . Hx of cardiovascular stress test     Myoview (9/15):  inf-apical scar; no ischemia; EF 47% - low risk   . 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 Serratia marcescens infection -  bactermia assoc w/ PICC 01/18/2015    Current Outpatient Prescriptions  Medication Sig Dispense Refill  . ADULT TPN Home TPN from [facility]: 1800 ml bag, 4 nights week for 8 hours  (includes 1 hour taper up and down).  Monday, Wednesday, Thursday and Fridays.    Marland Kitchen aspirin EC 81 MG tablet Take 1 tablet (81 mg total) by mouth daily.    . Cholecalciferol (VITAMIN D-3 PO) Take 1 tablet by mouth daily.     . clobetasol ointment (TEMOVATE) 8.76 % Apply 1 application topically 2 (two) times daily as needed (around the pick for a rash/irritation). 60 g 1  . diphenhydrAMINE (BENADRYL) 25 mg capsule Take 25 mg by mouth every 6 (six) hours as needed. For itching/sleep.    . Diphenoxylate-Atropine (LOMOTIL PO) Take 2 tablets by mouth daily as  needed (FOR LOOSE STOOL).    Marland Kitchen loperamide (IMODIUM) 2 MG capsule Take 4 mg by mouth daily as needed for diarrhea or loose stools.    . Multiple Vitamin (MULTIVITAMIN WITH MINERALS) TABS Take 1 tablet by mouth daily.    Marland Kitchen omeprazole (PRILOSEC) 40 MG capsule TAKE ONE CAPSULE BY MOUTH TWICE DAILY AT 10 AM AND 5 PM 60 capsule 5  . ondansetron (ZOFRAN) 4 MG tablet Take 1 tablet (4 mg total) by mouth every 8 (eight) hours as needed for nausea or vomiting. 60 tablet 1  . ondansetron (ZOFRAN-ODT) 8 MG disintegrating tablet Take 1 tablet (8 mg total) by mouth every 8 (eight) hours as needed for nausea or vomiting. 20 tablet 0  . Teduglutide, rDNA, (GATTEX) 5 MG KIT Inject 3.3 Units into the skin daily. (Patient taking differently: Inject 3.1 mLs into the skin daily. ) 1 kit 11  . VITAMIN E PO Take 1 tablet by mouth daily.      No current facility-administered medications for this visit.    Allergies:   Iohexol and Penicillins   Social History:  The patient  reports that she quit smoking about 11 years ago. Her smoking use included Cigarettes. She has never used smokeless tobacco. She reports that she does not drink alcohol or use illicit drugs.   Family History:  The patient's family history includes Diabetes in her mother; Hypertension in her mother. There is no history of Colon cancer or Stomach cancer.   ROS:  Please see the history of present illness.   She has chronic diarrhea.   All other systems reviewed and negative.   PHYSICAL EXAM: VS:  BP 122/66 mmHg  Pulse 69  Ht _0  (1.727 m)  Wt 63.231 kg (139 lb 6.4 oz)  BMI 21.20 kg/m2  LMP 02/26/2014 Well nourished, well developed, in no acute distress HEENT: normal Neck: no JVD Cardiac:  normal S1, S2; RRR; no murmur  PIC line under left clavicle  Lungs:  clear to auscultation bilaterally, no wheezing, rhonchi or rales Abd: soft, nontender, no hepatomegaly Ext: no edema Skin: warm and dry Neuro:  CNs 2-12 intact, no focal abnormalities  noted  EKG:   11/17/13   NSR, HR 64, normal axis, IVCD, poor R wave progression, anterolateral T wave inversions, first degree block with a PR interval of 222    06/15/15  SR rate 67  LBBB PR 212   Assessment and Plan:  CHF: euvolemic f/u echo to assess EF continue current meds Short Bowel Syndrome stable getting TPN every 4 days new PIC line looks good Bacteremia resolved off antibiotics no further chills Carotid f.u duplex 2015 had  40-59% right ICA disease     Jenkins Rouge

## 2015-06-15 ENCOUNTER — Ambulatory Visit (INDEPENDENT_AMBULATORY_CARE_PROVIDER_SITE_OTHER): Payer: Medicare Other | Admitting: Cardiovascular Disease

## 2015-06-15 ENCOUNTER — Encounter: Payer: Self-pay | Admitting: Cardiovascular Disease

## 2015-06-15 VITALS — BP 122/66 | HR 69 | Ht 68.0 in | Wt 139.4 lb

## 2015-06-15 DIAGNOSIS — I5022 Chronic systolic (congestive) heart failure: Secondary | ICD-10-CM

## 2015-06-15 DIAGNOSIS — I6529 Occlusion and stenosis of unspecified carotid artery: Secondary | ICD-10-CM | POA: Diagnosis not present

## 2015-06-15 DIAGNOSIS — I429 Cardiomyopathy, unspecified: Secondary | ICD-10-CM

## 2015-06-15 NOTE — Patient Instructions (Addendum)
Medication Instructions:  Your physician recommends that you continue on your current medications as directed. Please refer to the Current Medication list given to you today.  Labwork: NONE  Testing/Procedures: Your physician has requested that you have an echocardiogram. Echocardiography is a painless test that uses sound waves to create images of your heart. It provides your doctor with information about the size and shape of your heart and how well your heart's chambers and valves are working. This procedure takes approximately one hour. There are no restrictions for this procedure.  Your physician has requested that you have a carotid duplex. This test is an ultrasound of the carotid arteries in your neck. It looks at blood flow through these arteries that supply the brain with blood. Allow one hour for this exam. There are no restrictions or special instructions.  Follow-Up: Your physician wants you to follow-up in: 12 months with Dr. Nishan. You will receive a reminder letter in the mail two months in advance. If you don't receive a letter, please call our office to schedule the follow-up appointment.   If you need a refill on your cardiac medications before your next appointment, please call your pharmacy.    

## 2015-06-18 ENCOUNTER — Other Ambulatory Visit (INDEPENDENT_AMBULATORY_CARE_PROVIDER_SITE_OTHER): Payer: Medicare Other

## 2015-06-18 DIAGNOSIS — K912 Postsurgical malabsorption, not elsewhere classified: Secondary | ICD-10-CM

## 2015-06-18 LAB — CBC WITH DIFFERENTIAL/PLATELET
Basophils Absolute: 0 10*3/uL (ref 0.0–0.1)
Basophils Relative: 0.4 % (ref 0.0–3.0)
EOS PCT: 9.2 % — AB (ref 0.0–5.0)
Eosinophils Absolute: 0.7 10*3/uL (ref 0.0–0.7)
HEMATOCRIT: 33.6 % — AB (ref 36.0–46.0)
Hemoglobin: 10.8 g/dL — ABNORMAL LOW (ref 12.0–15.0)
LYMPHS ABS: 2.1 10*3/uL (ref 0.7–4.0)
Lymphocytes Relative: 28.9 % (ref 12.0–46.0)
MCHC: 32 g/dL (ref 30.0–36.0)
MCV: 95.7 fl (ref 78.0–100.0)
MONOS PCT: 8.9 % (ref 3.0–12.0)
Monocytes Absolute: 0.6 10*3/uL (ref 0.1–1.0)
NEUTROS ABS: 3.8 10*3/uL (ref 1.4–7.7)
NEUTROS PCT: 52.6 % (ref 43.0–77.0)
Platelets: 105 10*3/uL — ABNORMAL LOW (ref 150.0–400.0)
RBC: 3.51 Mil/uL — AB (ref 3.87–5.11)
RDW: 14.3 % (ref 11.5–15.5)
WBC: 7.3 10*3/uL (ref 4.0–10.5)

## 2015-06-18 LAB — COMPREHENSIVE METABOLIC PANEL
ALBUMIN: 3.5 g/dL (ref 3.5–5.2)
ALK PHOS: 75 U/L (ref 39–117)
ALT: 25 U/L (ref 0–35)
AST: 20 U/L (ref 0–37)
BUN: 22 mg/dL (ref 6–23)
CALCIUM: 8.8 mg/dL (ref 8.4–10.5)
CHLORIDE: 105 meq/L (ref 96–112)
CO2: 22 mEq/L (ref 19–32)
Creatinine, Ser: 0.95 mg/dL (ref 0.40–1.20)
GFR: 62.72 mL/min (ref >60.00)
Glucose, Bld: 84 mg/dL (ref 70–99)
Potassium: 3.4 mEq/L — ABNORMAL LOW (ref 3.5–5.1)
SODIUM: 137 meq/L (ref 135–145)
TOTAL PROTEIN: 7.8 g/dL (ref 6.0–8.3)
Total Bilirubin: 0.5 mg/dL (ref 0.2–1.2)

## 2015-06-18 LAB — MAGNESIUM: Magnesium: 1.6 mg/dL (ref 1.5–2.5)

## 2015-06-18 LAB — PHOSPHORUS: Phosphorus: 3.9 mg/dL (ref 2.3–4.6)

## 2015-06-18 LAB — BILIRUBIN, DIRECT: BILIRUBIN DIRECT: 0.1 mg/dL (ref 0.0–0.3)

## 2015-06-18 LAB — TRIGLYCERIDES: TRIGLYCERIDES: 206 mg/dL — AB (ref 0.0–149.0)

## 2015-06-18 LAB — C-REACTIVE PROTEIN: CRP: 0.1 mg/dL — ABNORMAL LOW (ref 0.5–20.0)

## 2015-06-18 LAB — PREALBUMIN: Prealbumin: 23 mg/dL (ref 17–34)

## 2015-06-19 NOTE — Progress Notes (Signed)
Quick Note:  Labs ok - stable - please forward to TPN service ______

## 2015-07-02 NOTE — Assessment & Plan Note (Signed)
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 She received last Aranesp injection February 2016. Today her hemoglobin is 10.1 and does not need Aranesp injection. She has required a total of 3 Aranesp injections in 2015, while she received at least 10 injections in 2014. So we discontinued therapy.  Current treatment: Observation Thrombocytopenia: Platelets 102.  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 This is the reason for her chronic disease.  Return to clinic in 6 months for follow-up

## 2015-07-03 ENCOUNTER — Encounter: Payer: Self-pay | Admitting: Hematology and Oncology

## 2015-07-03 ENCOUNTER — Ambulatory Visit (HOSPITAL_BASED_OUTPATIENT_CLINIC_OR_DEPARTMENT_OTHER): Payer: Medicare Other | Admitting: Hematology and Oncology

## 2015-07-03 VITALS — BP 123/58 | HR 66 | Temp 97.6°F | Resp 17 | Wt 138.8 lb

## 2015-07-03 DIAGNOSIS — K912 Postsurgical malabsorption, not elsewhere classified: Secondary | ICD-10-CM

## 2015-07-03 DIAGNOSIS — D638 Anemia in other chronic diseases classified elsewhere: Secondary | ICD-10-CM

## 2015-07-03 NOTE — Progress Notes (Signed)
Patient Care Team: [name], MD as PCP - General Consuela Mimes, MD as Consulting Physician (Medical Oncology) Gatha Mayer, MD as Consulting Physician (Gastroenterology)  DIAGNOSIS: Normocytic normochromic anemia.  CHIEF COMPLIANT: Follow-up of anemia  INTERVAL HISTORY: [name] [name] is a 65 year old with above-mentioned history of normocytic anemia who was previously treated with erythropoietin stabilizing agents. I have discontinued Aranesp treatments and is here for six-month follow-up. She has had infection of the PICC line and was hospitalized in January. She was treated with antibiotics and she is feeling much better. She continues to receive TPN 4 times a week and she is hoping to go to 3 times a week soon.  REVIEW OF SYSTEMS:   Constitutional: Denies fevers, chills or abnormal weight loss Eyes: Denies blurriness of vision Ears, nose, mouth, throat, and face: Denies mucositis or sore throat Respiratory: Denies cough, dyspnea or wheezes Cardiovascular: Denies palpitation, chest discomfort Gastrointestinal:  Denies nausea, heartburn or change in bowel habits Skin: Denies abnormal skin rashes Lymphatics: Denies new lymphadenopathy or easy bruising Neurological:Denies numbness, tingling or new weaknesses Behavioral/Psych: Mood is stable, no new changes  Extremities: No lower extremity edema All other systems were reviewed with the patient and are negative.  I have reviewed the past medical history, past surgical history, social history and family history with the patient and they are unchanged from previous note.  ALLERGIES:  is allergic to iohexol and penicillins.  MEDICATIONS:  Current Outpatient Prescriptions  Medication Sig Dispense Refill  . ADULT TPN Home TPN from [facility]: 1800 ml bag, 4 nights week for 8 hours  (includes 1 hour taper up and down).  Monday, Wednesday, Thursday and Fridays.    Marland Kitchen aspirin EC 81 MG tablet Take 1 tablet (81 mg total) by mouth  daily.    . Cholecalciferol (VITAMIN D-3 PO) Take 1 tablet by mouth daily.     . clobetasol ointment (TEMOVATE) 1.69 % Apply 1 application topically 2 (two) times daily as needed (around the pick for a rash/irritation). 60 g 1  . diphenhydrAMINE (BENADRYL) 25 mg capsule Take 25 mg by mouth every 6 (six) hours as needed. For itching/sleep.    . Diphenoxylate-Atropine (LOMOTIL PO) Take 2 tablets by mouth daily as needed (FOR LOOSE STOOL).    Marland Kitchen loperamide (IMODIUM) 2 MG capsule Take 4 mg by mouth daily as needed for diarrhea or loose stools.    . Multiple Vitamin (MULTIVITAMIN WITH MINERALS) TABS Take 1 tablet by mouth daily.    Marland Kitchen omeprazole (PRILOSEC) 40 MG capsule TAKE ONE CAPSULE BY MOUTH TWICE DAILY AT 10 AM AND 5 PM 60 capsule 5  . ondansetron (ZOFRAN) 4 MG tablet Take 1 tablet (4 mg total) by mouth every 8 (eight) hours as needed for nausea or vomiting. 60 tablet 1  . ondansetron (ZOFRAN-ODT) 8 MG disintegrating tablet Take 1 tablet (8 mg total) by mouth every 8 (eight) hours as needed for nausea or vomiting. 20 tablet 0  . Teduglutide, rDNA, (GATTEX) 5 MG KIT Inject 3.3 Units into the skin daily. (Patient taking differently: Inject 3.1 mLs into the skin daily. ) 1 kit 11  . VITAMIN E PO Take 1 tablet by mouth daily.      No current facility-administered medications for this visit.    PHYSICAL EXAMINATION: ECOG PERFORMANCE STATUS: 1 - Symptomatic but completely ambulatory  Filed Vitals:   07/03/15 0959  BP: 123/58  Pulse: 66  Temp: 97.6 F (36.4 C)  Resp: 17   Filed  Weights   07/03/15 0959  Weight: 138 lb 12.8 oz (62.959 kg)    GENERAL:alert, no distress and comfortable SKIN: skin color, texture, turgor are normal, no rashes or significant lesions EYES: normal, Conjunctiva are pink and non-injected, sclera clear OROPHARYNX:no exudate, no erythema and lips, buccal mucosa, and tongue normal  NECK: supple, thyroid normal size, non-tender, without nodularity LYMPH:  no palpable  lymphadenopathy in the cervical, axillary or inguinal LUNGS: clear to auscultation and percussion with normal breathing effort HEART: regular rate & rhythm and no murmurs and no lower extremity edema ABDOMEN:abdomen soft, non-tender and normal bowel sounds MUSCULOSKELETAL:no cyanosis of digits and no clubbing  NEURO: alert & oriented x 3 with fluent speech, no focal motor/sensory deficits EXTREMITIES: No lower extremity edema  LABORATORY DATA:  I have reviewed the data as listed   Chemistry      Component Value Date/Time   NA 137 06/18/2015 0912   NA 139 01/13/2014 0951   NA 140 03/01/2009 1029   K 3.4* 06/18/2015 0912   K 3.6 01/13/2014 0951   K 4.2 03/01/2009 1029   CL 105 06/18/2015 0912   CL 106 10/22/2012 1005   CL 104 03/01/2009 1029   CO2 22 06/18/2015 0912   CO2 21* 01/13/2014 0951   CO2 25 03/01/2009 1029   BUN 22 06/18/2015 0912   BUN 14.6 01/13/2014 0951   BUN 17 03/01/2009 1029   CREATININE 0.95 06/18/2015 0912   CREATININE 1.0 01/13/2014 0951   CREATININE 0.5* 03/01/2009 1029      Component Value Date/Time   CALCIUM 8.8 06/18/2015 0912   CALCIUM 8.9 01/13/2014 0951   CALCIUM 8.7 03/01/2009 1029   ALKPHOS 75 06/18/2015 0912   ALKPHOS 85 01/13/2014 0951   ALKPHOS 143* 03/01/2009 1029   AST 20 06/18/2015 0912   AST 19 01/13/2014 0951   AST 46* 03/01/2009 1029   ALT 25 06/18/2015 0912   ALT 20 01/13/2014 0951   ALT 49* 03/01/2009 1029   BILITOT 0.5 06/18/2015 0912   BILITOT 0.26 01/13/2014 0951   BILITOT 0.40 03/01/2009 1029       Lab Results  Component Value Date   WBC 7.3 06/18/2015   HGB 10.8* 06/18/2015   HCT 33.6* 06/18/2015   MCV 95.7 06/18/2015   PLT 105.0* 06/18/2015   NEUTROABS 3.8 06/18/2015     ASSESSMENT & PLAN:  Anemia of chronic disease Normochromic normocytic anemia due 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 She received last Aranesp injection February 2016. Today her hemoglobin is 10.1 and  does not need Aranesp injection. She has required a total of 3 Aranesp injections in 2015, while she received at least 10 injections in 2014. So we discontinued therapy.  Current treatment: Observation Thrombocytopenia: Platelets 105.  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 This is the reason for her chronic disease.  Return to clinic in 1 year for follow-up.   No orders of the defined types were placed in this encounter.   The patient has a good understanding of the overall plan. she agrees with it. she will call with any problems that may develop before the next visit here.   Rulon Eisenmenger, MD 07/03/2015

## 2015-07-05 ENCOUNTER — Telehealth: Payer: Self-pay | Admitting: Hematology and Oncology

## 2015-07-05 NOTE — Telephone Encounter (Signed)
S/w pt, gave appt 07/04/16.

## 2015-07-09 ENCOUNTER — Other Ambulatory Visit (INDEPENDENT_AMBULATORY_CARE_PROVIDER_SITE_OTHER): Payer: Medicare Other

## 2015-07-09 DIAGNOSIS — K912 Postsurgical malabsorption, not elsewhere classified: Secondary | ICD-10-CM | POA: Diagnosis not present

## 2015-07-09 LAB — CBC WITH DIFFERENTIAL/PLATELET
BASOS PCT: 0.7 % (ref 0.0–3.0)
Basophils Absolute: 0 10*3/uL (ref 0.0–0.1)
EOS ABS: 0.2 10*3/uL (ref 0.0–0.7)
Eosinophils Relative: 5 % (ref 0.0–5.0)
HEMATOCRIT: 33 % — AB (ref 36.0–46.0)
HEMOGLOBIN: 10.9 g/dL — AB (ref 12.0–15.0)
LYMPHS ABS: 2.1 10*3/uL (ref 0.7–4.0)
Lymphocytes Relative: 44.4 % (ref 12.0–46.0)
MCHC: 33 g/dL (ref 30.0–36.0)
MCV: 93.4 fl (ref 78.0–100.0)
MONO ABS: 0.4 10*3/uL (ref 0.1–1.0)
Monocytes Relative: 9 % (ref 3.0–12.0)
NEUTROS ABS: 1.9 10*3/uL (ref 1.4–7.7)
Neutrophils Relative %: 40.9 % — ABNORMAL LOW (ref 43.0–77.0)
Platelets: 121 10*3/uL — ABNORMAL LOW (ref 150.0–400.0)
RBC: 3.53 Mil/uL — ABNORMAL LOW (ref 3.87–5.11)
RDW: 14.5 % (ref 11.5–15.5)
WBC: 4.7 10*3/uL (ref 4.0–10.5)

## 2015-07-09 LAB — COMPREHENSIVE METABOLIC PANEL
ALBUMIN: 3.6 g/dL (ref 3.5–5.2)
ALT: 18 U/L (ref 0–35)
AST: 18 U/L (ref 0–37)
Alkaline Phosphatase: 77 U/L (ref 39–117)
BUN: 17 mg/dL (ref 6–23)
CHLORIDE: 105 meq/L (ref 96–112)
CO2: 24 mEq/L (ref 19–32)
Calcium: 9 mg/dL (ref 8.4–10.5)
Creatinine, Ser: 0.94 mg/dL (ref 0.40–1.20)
GFR: 63.48 mL/min (ref >60.00)
Glucose, Bld: 87 mg/dL (ref 70–99)
POTASSIUM: 3.8 meq/L (ref 3.5–5.1)
SODIUM: 137 meq/L (ref 135–145)
Total Bilirubin: 0.5 mg/dL (ref 0.2–1.2)
Total Protein: 8 g/dL (ref 6.0–8.3)

## 2015-07-09 LAB — MAGNESIUM: Magnesium: 1.8 mg/dL (ref 1.5–2.5)

## 2015-07-09 LAB — PHOSPHORUS: Phosphorus: 4 mg/dL (ref 2.3–4.6)

## 2015-07-09 LAB — BILIRUBIN, DIRECT: Bilirubin, Direct: 0.2 mg/dL (ref 0.0–0.3)

## 2015-07-09 LAB — C-REACTIVE PROTEIN: CRP: 0.2 mg/dL — ABNORMAL LOW (ref 0.5–20.0)

## 2015-07-09 LAB — TRIGLYCERIDES: TRIGLYCERIDES: 171 mg/dL — AB (ref 0.0–149.0)

## 2015-07-10 ENCOUNTER — Inpatient Hospital Stay (HOSPITAL_COMMUNITY): Admission: RE | Admit: 2015-07-10 | Payer: Medicare Other | Source: Ambulatory Visit

## 2015-07-10 ENCOUNTER — Ambulatory Visit (HOSPITAL_COMMUNITY): Payer: Medicare Other

## 2015-07-11 LAB — PREALBUMIN: PREALBUMIN: 20 mg/dL (ref 17–34)

## 2015-07-11 NOTE — Progress Notes (Signed)
Quick Note:  Labs ok - NL or stable abnormalities Please route all to TPN pharmacist ______

## 2015-07-17 ENCOUNTER — Other Ambulatory Visit: Payer: Self-pay | Admitting: Internal Medicine

## 2015-07-19 ENCOUNTER — Ambulatory Visit (HOSPITAL_COMMUNITY)
Admission: RE | Admit: 2015-07-19 | Discharge: 2015-07-19 | Disposition: A | Discharge status: Home / Self Care | Payer: Medicare Other | Source: Ambulatory Visit | Attending: Cardiology | Admitting: Cardiology

## 2015-07-19 ENCOUNTER — Ambulatory Visit (HOSPITAL_BASED_OUTPATIENT_CLINIC_OR_DEPARTMENT_OTHER): Payer: Medicare Other

## 2015-07-19 ENCOUNTER — Other Ambulatory Visit: Payer: Self-pay

## 2015-07-19 DIAGNOSIS — I5022 Chronic systolic (congestive) heart failure: Secondary | ICD-10-CM | POA: Insufficient documentation

## 2015-07-19 DIAGNOSIS — I6529 Occlusion and stenosis of unspecified carotid artery: Secondary | ICD-10-CM

## 2015-07-19 DIAGNOSIS — I340 Nonrheumatic mitral (valve) insufficiency: Secondary | ICD-10-CM | POA: Diagnosis not present

## 2015-07-19 DIAGNOSIS — I252 Old myocardial infarction: Secondary | ICD-10-CM | POA: Insufficient documentation

## 2015-07-19 DIAGNOSIS — I350 Nonrheumatic aortic (valve) stenosis: Secondary | ICD-10-CM | POA: Insufficient documentation

## 2015-07-19 DIAGNOSIS — I6523 Occlusion and stenosis of bilateral carotid arteries: Secondary | ICD-10-CM | POA: Insufficient documentation

## 2015-07-19 DIAGNOSIS — E119 Type 2 diabetes mellitus without complications: Secondary | ICD-10-CM | POA: Diagnosis not present

## 2015-07-19 DIAGNOSIS — I429 Cardiomyopathy, unspecified: Secondary | ICD-10-CM | POA: Insufficient documentation

## 2015-07-19 DIAGNOSIS — Z87891 Personal history of nicotine dependence: Secondary | ICD-10-CM | POA: Insufficient documentation

## 2015-07-19 DIAGNOSIS — I517 Cardiomegaly: Secondary | ICD-10-CM | POA: Diagnosis not present

## 2015-07-23 ENCOUNTER — Telehealth: Payer: Self-pay

## 2015-07-23 MED ORDER — LOSARTAN POTASSIUM 25 MG PO TABS: 25.0000 mg | ORAL_TABLET | Freq: Every day | ORAL | Status: DC

## 2015-07-23 MED ORDER — CARVEDILOL 3.125 MG PO TABS: 3.1250 mg | ORAL_TABLET | Freq: Two times a day (BID) | ORAL | Status: DC

## 2015-07-23 NOTE — Telephone Encounter (Signed)
Called patient about her echo results. Patient to start coreg 3.125 mg by mouth twice daily and Cozaar 25 mg by mouth daily. Patient has follow-up appointment in May. Patient has lab work every 3 weeks with Dr. Celesta Aver office, will send his office results of echocardiogram and see if she can get lab work done at the same time.

## 2015-07-23 NOTE — Telephone Encounter (Signed)
-----   Message from Josue Hector, MD sent at 07/19/2015  9:01 PM EST ----- EF worse than before 25-30%  Start coreg 3.125 bid and cozaar 25 mg f/u with me next available BNP and BMET 3 weeks after starting meds

## 2015-07-30 ENCOUNTER — Other Ambulatory Visit (INDEPENDENT_AMBULATORY_CARE_PROVIDER_SITE_OTHER): Payer: Medicare Other

## 2015-07-30 DIAGNOSIS — K912 Postsurgical malabsorption, not elsewhere classified: Secondary | ICD-10-CM

## 2015-07-30 LAB — CBC WITH DIFFERENTIAL/PLATELET
BASOS PCT: 0.6 % (ref 0.0–3.0)
Basophils Absolute: 0 10*3/uL (ref 0.0–0.1)
EOS ABS: 0.2 10*3/uL (ref 0.0–0.7)
Eosinophils Relative: 3.2 % (ref 0.0–5.0)
HCT: 31.8 % — ABNORMAL LOW (ref 36.0–46.0)
Hemoglobin: 10.7 g/dL — ABNORMAL LOW (ref 12.0–15.0)
Lymphocytes Relative: 35.1 % (ref 12.0–46.0)
Lymphs Abs: 1.9 10*3/uL (ref 0.7–4.0)
MCHC: 33.6 g/dL (ref 30.0–36.0)
MCV: 92.6 fl (ref 78.0–100.0)
MONO ABS: 0.6 10*3/uL (ref 0.1–1.0)
Monocytes Relative: 11.5 % (ref 3.0–12.0)
NEUTROS ABS: 2.7 10*3/uL (ref 1.4–7.7)
Neutrophils Relative %: 49.6 % (ref 43.0–77.0)
Platelets: 127 10*3/uL — ABNORMAL LOW (ref 150.0–400.0)
RBC: 3.44 Mil/uL — ABNORMAL LOW (ref 3.87–5.11)
RDW: 14.4 % (ref 11.5–15.5)
WBC: 5.4 10*3/uL (ref 4.0–10.5)

## 2015-07-30 LAB — MAGNESIUM: Magnesium: 1.8 mg/dL (ref 1.5–2.5)

## 2015-07-30 LAB — BILIRUBIN, DIRECT: BILIRUBIN DIRECT: 0.1 mg/dL (ref 0.0–0.3)

## 2015-07-30 LAB — COMPREHENSIVE METABOLIC PANEL
ALT: 24 U/L (ref 0–35)
AST: 19 U/L (ref 0–37)
Albumin: 3.5 g/dL (ref 3.5–5.2)
Alkaline Phosphatase: 80 U/L (ref 39–117)
BUN: 18 mg/dL (ref 6–23)
CHLORIDE: 103 meq/L (ref 96–112)
CO2: 24 meq/L (ref 19–32)
CREATININE: 0.93 mg/dL (ref 0.40–1.20)
Calcium: 9 mg/dL (ref 8.4–10.5)
GFR: 64.26 mL/min (ref >60.00)
GLUCOSE: 81 mg/dL (ref 70–99)
Potassium: 3.7 mEq/L (ref 3.5–5.1)
SODIUM: 135 meq/L (ref 135–145)
Total Bilirubin: 0.5 mg/dL (ref 0.2–1.2)
Total Protein: 8 g/dL (ref 6.0–8.3)

## 2015-07-30 LAB — TRIGLYCERIDES: TRIGLYCERIDES: 182 mg/dL — AB (ref 0.0–149.0)

## 2015-07-30 LAB — PHOSPHORUS: PHOSPHORUS: 3.9 mg/dL (ref 2.3–4.6)

## 2015-07-30 LAB — C-REACTIVE PROTEIN: CRP: 0.7 mg/dL (ref 0.5–20.0)

## 2015-07-31 LAB — PREALBUMIN: Prealbumin: 18 mg/dL (ref 17–34)

## 2015-07-31 NOTE — Progress Notes (Signed)
Quick Note:  Stable labs Please cc her TPN pharmacist ______

## 2015-08-09 ENCOUNTER — Other Ambulatory Visit: Payer: Self-pay

## 2015-08-09 DIAGNOSIS — K912 Postsurgical malabsorption, not elsewhere classified: Secondary | ICD-10-CM

## 2015-08-20 ENCOUNTER — Other Ambulatory Visit (INDEPENDENT_AMBULATORY_CARE_PROVIDER_SITE_OTHER): Payer: Medicare Other

## 2015-08-20 DIAGNOSIS — K912 Postsurgical malabsorption, not elsewhere classified: Secondary | ICD-10-CM | POA: Diagnosis not present

## 2015-08-20 LAB — CBC WITH DIFFERENTIAL/PLATELET
BASOS ABS: 0 10*3/uL (ref 0.0–0.1)
BASOS PCT: 0.8 % (ref 0.0–3.0)
EOS ABS: 0.2 10*3/uL (ref 0.0–0.7)
Eosinophils Relative: 4.2 % (ref 0.0–5.0)
HCT: 33.8 % — ABNORMAL LOW (ref 36.0–46.0)
Hemoglobin: 11.3 g/dL — ABNORMAL LOW (ref 12.0–15.0)
Lymphocytes Relative: 41 % (ref 12.0–46.0)
Lymphs Abs: 2.2 10*3/uL (ref 0.7–4.0)
MCHC: 33.5 g/dL (ref 30.0–36.0)
MCV: 92.4 fl (ref 78.0–100.0)
MONO ABS: 0.5 10*3/uL (ref 0.1–1.0)
Monocytes Relative: 10.2 % (ref 3.0–12.0)
NEUTROS ABS: 2.3 10*3/uL (ref 1.4–7.7)
Neutrophils Relative %: 43.8 % (ref 43.0–77.0)
PLATELETS: 136 10*3/uL — AB (ref 150.0–400.0)
RBC: 3.66 Mil/uL — ABNORMAL LOW (ref 3.87–5.11)
RDW: 14.6 % (ref 11.5–15.5)
WBC: 5.3 10*3/uL (ref 4.0–10.5)

## 2015-08-20 LAB — COMPREHENSIVE METABOLIC PANEL
ALBUMIN: 3.7 g/dL (ref 3.5–5.2)
ALT: 16 U/L (ref 0–35)
AST: 16 U/L (ref 0–37)
Alkaline Phosphatase: 81 U/L (ref 39–117)
BILIRUBIN TOTAL: 0.5 mg/dL (ref 0.2–1.2)
BUN: 15 mg/dL (ref 6–23)
CALCIUM: 9.3 mg/dL (ref 8.4–10.5)
CHLORIDE: 107 meq/L (ref 96–112)
CO2: 22 meq/L (ref 19–32)
CREATININE: 0.89 mg/dL (ref 0.40–1.20)
GFR: 67.59 mL/min (ref >60.00)
Glucose, Bld: 92 mg/dL (ref 70–99)
Potassium: 3.3 mEq/L — ABNORMAL LOW (ref 3.5–5.1)
SODIUM: 137 meq/L (ref 135–145)
Total Protein: 8.5 g/dL — ABNORMAL HIGH (ref 6.0–8.3)

## 2015-08-20 LAB — TRIGLYCERIDES: TRIGLYCERIDES: 192 mg/dL — AB (ref 0.0–149.0)

## 2015-08-20 LAB — C-REACTIVE PROTEIN: CRP: 0.3 mg/dL — AB (ref 0.5–20.0)

## 2015-08-20 LAB — MAGNESIUM: Magnesium: 1.8 mg/dL (ref 1.5–2.5)

## 2015-08-20 LAB — VITAMIN D 25 HYDROXY (VIT D DEFICIENCY, FRACTURES): VITD: 13.35 ng/mL — ABNORMAL LOW (ref 30.00–100.00)

## 2015-08-20 LAB — PHOSPHORUS: PHOSPHORUS: 4 mg/dL (ref 2.3–4.6)

## 2015-08-20 LAB — BILIRUBIN, DIRECT: Bilirubin, Direct: 0.1 mg/dL (ref 0.0–0.3)

## 2015-08-21 LAB — FATTY ACIDS PROFILE, PEROXISOMAL

## 2015-08-21 LAB — PREALBUMIN: PREALBUMIN: 22 mg/dL (ref 17–34)

## 2015-08-23 ENCOUNTER — Telehealth: Payer: Self-pay | Admitting: Internal Medicine

## 2015-08-23 ENCOUNTER — Other Ambulatory Visit: Payer: Medicare Other

## 2015-08-23 ENCOUNTER — Telehealth: Payer: Self-pay

## 2015-08-23 NOTE — Telephone Encounter (Signed)
I called and informed [name] [name] with [facility] .  She is going to call [facility] tomorrow to get an update as we are closed.  She has my direct # to leave me a voice mail of her findings.

## 2015-08-23 NOTE — Telephone Encounter (Signed)
Katie from [facility] called to let us know the wrong fatty acid profile was ordered on Monday Dyllan 10th 2017.  I've called [facility] and spoken with [name] who is going to see if they can cancel that test and order the correct test:  Fatty acid profile essential , C12-C22.   I will call her back later on today to see where we stand.

## 2015-08-23 NOTE — Telephone Encounter (Signed)
Spoke with Evalee Jefferson at [facility] for an update.  They sent the blood out to [facility] and are awaiting to hear from them.

## 2015-08-23 NOTE — Telephone Encounter (Signed)
Lab have been faxed to [facility] [address] as requested

## 2015-08-28 NOTE — Telephone Encounter (Signed)
Spoke with [name] [name] at [name] [name], phone # [phone]. She was able to get [facility] to cancel the lab test that they didn't need and was able to reorder the [name] [name] using the same blood sample.  It gets sent out to [name] [name].  She is going to check with them later on for the results and she is going to send Dr Carlean Purl an update.  [name] [name] is going to call [name] [name] today to update her on what's going on with her labs.  [name] [name] said her Vitamin D was low so she's going to confirm if [name] [name] is taking her Vitamin D.

## 2015-09-03 LAB — OTHER SOLSTAS TEST

## 2015-09-05 ENCOUNTER — Telehealth: Payer: Self-pay | Admitting: Internal Medicine

## 2015-09-05 ENCOUNTER — Other Ambulatory Visit: Payer: Self-pay

## 2015-09-05 DIAGNOSIS — I509 Heart failure, unspecified: Secondary | ICD-10-CM

## 2015-09-05 NOTE — Telephone Encounter (Signed)
Add that on dx CHF

## 2015-09-05 NOTE — Telephone Encounter (Signed)
Pt aware and order in epic. 

## 2015-09-05 NOTE — Telephone Encounter (Signed)
Pt wants to know if she can have a BNP that Dr. Johnsie Cancel wants her to have done on  Monday when she comes for labs here. Please advise if ok to add BNP on.

## 2015-09-10 ENCOUNTER — Other Ambulatory Visit (INDEPENDENT_AMBULATORY_CARE_PROVIDER_SITE_OTHER): Payer: Medicare Other

## 2015-09-10 DIAGNOSIS — K912 Postsurgical malabsorption, not elsewhere classified: Secondary | ICD-10-CM

## 2015-09-10 DIAGNOSIS — I509 Heart failure, unspecified: Secondary | ICD-10-CM

## 2015-09-10 LAB — CBC WITH DIFFERENTIAL/PLATELET
BASOS ABS: 0 10*3/uL (ref 0.0–0.1)
Basophils Relative: 0.5 % (ref 0.0–3.0)
EOS ABS: 0.2 10*3/uL (ref 0.0–0.7)
Eosinophils Relative: 4.3 % (ref 0.0–5.0)
HEMATOCRIT: 30.7 % — AB (ref 36.0–46.0)
HEMOGLOBIN: 10.1 g/dL — AB (ref 12.0–15.0)
LYMPHS PCT: 40.2 % (ref 12.0–46.0)
Lymphs Abs: 1.9 10*3/uL (ref 0.7–4.0)
MCHC: 32.9 g/dL (ref 30.0–36.0)
MCV: 92.8 fl (ref 78.0–100.0)
MONO ABS: 0.5 10*3/uL (ref 0.1–1.0)
Monocytes Relative: 11.1 % (ref 3.0–12.0)
Neutro Abs: 2.1 10*3/uL (ref 1.4–7.7)
Neutrophils Relative %: 43.9 % (ref 43.0–77.0)
Platelets: 118 10*3/uL — ABNORMAL LOW (ref 150.0–400.0)
RBC: 3.3 Mil/uL — ABNORMAL LOW (ref 3.87–5.11)
RDW: 15.1 % (ref 11.5–15.5)
WBC: 4.8 10*3/uL (ref 4.0–10.5)

## 2015-09-10 LAB — COMPREHENSIVE METABOLIC PANEL
ALK PHOS: 61 U/L (ref 39–117)
ALT: 17 U/L (ref 0–35)
AST: 15 U/L (ref 0–37)
Albumin: 3.4 g/dL — ABNORMAL LOW (ref 3.5–5.2)
BILIRUBIN TOTAL: 0.4 mg/dL (ref 0.2–1.2)
BUN: 16 mg/dL (ref 6–23)
CALCIUM: 8.9 mg/dL (ref 8.4–10.5)
CHLORIDE: 108 meq/L (ref 96–112)
CO2: 22 mEq/L (ref 19–32)
CREATININE: 0.94 mg/dL (ref 0.40–1.20)
GFR: 63.45 mL/min (ref >60.00)
Glucose, Bld: 86 mg/dL (ref 70–99)
Potassium: 4 mEq/L (ref 3.5–5.1)
Sodium: 137 mEq/L (ref 135–145)
Total Protein: 7.7 g/dL (ref 6.0–8.3)

## 2015-09-10 LAB — BILIRUBIN, DIRECT: BILIRUBIN DIRECT: 0.2 mg/dL (ref 0.0–0.3)

## 2015-09-10 LAB — TRIGLYCERIDES: TRIGLYCERIDES: 165 mg/dL — AB (ref 0.0–149.0)

## 2015-09-10 LAB — PHOSPHORUS: Phosphorus: 3.9 mg/dL (ref 2.3–4.6)

## 2015-09-10 LAB — MAGNESIUM: Magnesium: 1.8 mg/dL (ref 1.5–2.5)

## 2015-09-10 LAB — BRAIN NATRIURETIC PEPTIDE: PRO B NATRI PEPTIDE: 523 pg/mL — AB (ref 0.0–100.0)

## 2015-09-10 LAB — C-REACTIVE PROTEIN: CRP: 0.6 mg/dL (ref 0.5–20.0)

## 2015-09-10 NOTE — Progress Notes (Signed)
Quick Note:  Nutrition labs and Hgb pretty good - please send to pharmacist =- prealbumin pending  I am ccing these to Dr. Johnsie Cancel he needed to see them and BNP ______

## 2015-09-11 LAB — PREALBUMIN: PREALBUMIN: 17 mg/dL (ref 17–34)

## 2015-09-12 NOTE — Progress Notes (Signed)
Quick Note:  Send to pharmacist ______

## 2015-09-13 NOTE — Progress Notes (Signed)
Patient ID: [name] [name], female   DOB: June 22, 1950, 65 y.o.   MRN: [id number]   Cardiology Office Note    Date:  09/14/2015   ID:  Cotina C Tomko, DOB 09/13/1950, MRN [id number]  PCP:  Annye Asa, MD  Cardiologist:  Dr. Jenkins Rouge      History of Present Illness: Yulonda C Wojtaszek is a 65 y.o. 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  She is followed here by Dr. Carlean Purl and at [facility] with Dr. Clementeen Graham (has been placed on Gattex). I have not seen her since 2013  Seen by PA 7/15 With Dr Ron Parker.    Echo 5/15  Improved EF Study Conclusions  - Left ventricle: Inferior and apical hypokinesis. The cavity size was mildly dilated. Wall thickness was normal. The estimated ejection fraction was 45%. - Aortic valve: Valve area (VTI): 2.28 cm^2. Valve area (Vmax): 2.4 cm^2. - Mitral valve: There was mild regurgitation. - Atrial septum: No defect or patent foramen ovale was identified.  ------------------------------------------------------------------- Labs, prior tests, procedures, and surgery: Echocardiography (August 2013).   EF was 30%.  Myovue 9/15 with old inferior apical scar no ischemia  EF  47%   No further w/u deemed necessary by PA at time    Her imdur and coreg had been stopped  The former not thought to be doing any good and latter as she had first degree block and resting HR in 60's   Carotids updated 9/15 40-59% RICA stenosis.  F/U duplex in 1 year.  She feels great TPN 3x/week and iv fluid 1x/week Has been off anticoagulation for 3 years and it is not clear to me why   Recent admission for  ecoli bacteremia 05/2015  Antibiotics via PIC line  Which was replaced  Carotid  duplex 07/19/15 40-59% RICA stenosis   Echo 07/19/2015 showed drop in EF again Study Conclusions  - Left ventricle: The cavity size was mildly dilated. Systolic  function was severely reduced. The estimated ejection fraction  was in the range of 25% to 30%. Diffuse hypokinesis. Dyskinesis  and scarring of the mid-apicalanteroseptal and apical myocardium;  consistent with infarction in the distribution of the left  anterior descending coronary artery. Dyskinesis of the  mid-apicalinferior myocardium. Hypokinesis of the anterior  myocardium. Doppler parameters are consistent with abnormal left  ventricular relaxation (grade 1 diastolic dysfunction). No  evidence of thrombus. - Ventricular septum: Septal motion showed abnormal function,  dyssynergy, and paradox. These changes are consistent with  intraventricular conduction delay. - Aortic valve: There was mild stenosis. - Mitral valve: Calcified annulus. Mildly thickened leaflets .  There was mild to moderate regurgitation directed centrally. - Left atrium: The atrium was moderately dilated.  Started on ARB and coreg:  Recent Labs: 09/10/2015: ALT 17; Creatinine, Ser 0.94; Hemoglobin 10.1*; Potassium 4.0; Pro B Natriuretic peptide (BNP) 523.0*  Wt Readings from Last 3 Encounters:  09/14/15 63.957 kg (141 lb)  07/03/15 62.959 kg (138 lb 12.8 oz)  06/15/15 63.231 kg (139 lb 6.4 oz)     Past Medical History  Diagnosis Date  . 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 Abnormal LFTs   . Vitamin D deficiency   . Personal history of colonic polyps   .  Thrombophilia ([place])   . Diabetes mellitus     resolved after weight loss  . Obesity     prior to weight loss  . Allergic rhinosinusitis   . At risk for dental problems   . Fracture of left clavicle   . Osteoporosis   . Anemia of chronic disease   . Renal insufficiency   . Atypical nevus   . 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 Pancytopenia  10/07/2011  . 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 Bacterial overgrowth syndrome   . Splenomegaly     By ultrasound  . 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 Carotid stenosis     Carotid US (9/15):  R 40-59%; L 1-39% >> FU 1 year  . Hx of cardiovascular stress test     Myoview (9/15):  inf-apical scar; no ischemia; EF 47% - low risk   . 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 Serratia marcescens infection - bactermia assoc w/ PICC 01/18/2015    Current Outpatient Prescriptions  Medication Sig Dispense Refill  . ADULT TPN Home TPN from [facility]: 1800 ml bag, 4 nights week for 8 hours  (includes 1 hour taper up and down).  Monday, Wednesday, Thursday and Fridays.    Marland Kitchen aspirin EC 81 MG tablet Take 1 tablet (81 mg total) by mouth daily.    . carvedilol (COREG) 3.125 MG tablet Take 1 tablet (3.125 mg total) by mouth 2 (two) times daily. 180 tablet 3  . Cholecalciferol (VITAMIN D-3 PO) Take 1 tablet by mouth daily.     . clobetasol ointment (TEMOVATE) 4.65 % Apply 1 application topically 2 (two) times daily as needed (around the pick for a rash/irritation). 60 g 1  . diphenhydrAMINE (BENADRYL) 25 mg capsule Take 25 mg by mouth every 6 (six) hours as needed. For itching/sleep.    . Diphenoxylate-Atropine (LOMOTIL PO) Take 2 tablets by mouth daily as needed (FOR LOOSE STOOL).    Marland Kitchen loperamide (IMODIUM) 2 MG capsule Take 4 mg by mouth daily as needed for diarrhea or loose stools.    Marland Kitchen losartan (COZAAR) 25 MG tablet Take 1 tablet (25 mg total) by mouth daily. 90 tablet 3  . Multiple Vitamin (MULTIVITAMIN WITH MINERALS) TABS Take 1 tablet by mouth daily.    Marland Kitchen omeprazole (PRILOSEC) 40 MG capsule TAKE 1 CAPSULE BY MOUTH TWICE DAILY AT 10 AM AND AT 5 PM 60 capsule 5  . ondansetron (ZOFRAN) 4 MG tablet Take 1 tablet (4 mg total) by mouth every 8 (eight) hours as needed for nausea or vomiting. 60 tablet 1  . ondansetron (ZOFRAN-ODT) 8 MG disintegrating tablet Take 1 tablet (8 mg total) by mouth every 8  (eight) hours as needed for nausea or vomiting. 20 tablet 0  . Teduglutide, rDNA, (GATTEX) 5 MG KIT Inject 3.3 Units into the skin daily. (Patient taking differently: Inject 3.1 mLs into the skin daily. ) 1 kit 11  . VITAMIN E PO Take 1 tablet by mouth daily.      No current facility-administered medications for this visit.    Allergies:   Iohexol and Penicillins   Social History:  The patient  reports that she quit smoking about 11 years ago. Her smoking use included Cigarettes. She has never used smokeless tobacco. She reports that she does not drink alcohol or use illicit drugs.   Family History:  The patient's family history includes Diabetes in her mother; Hypertension in her mother. There is  no history of Colon cancer or Stomach cancer.   ROS:  Please see the history of present illness.   She has chronic diarrhea.   All other systems reviewed and negative.   PHYSICAL EXAM: VS:  BP 110/58 mmHg  Pulse 61  Ht _0  (1.727 m)  Wt 63.957 kg (141 lb)  BMI 21.44 kg/m2  SpO2 99%  LMP 02/26/2014 Well nourished, well developed, in no acute distress HEENT: normal Neck: no JVD Cardiac:  normal S1, S2; RRR; no murmur  PIC line under left clavicle  Lungs:  clear to auscultation bilaterally, no wheezing, rhonchi or rales Abd: soft, nontender, no hepatomegaly Ext: no edema Skin: warm and dry Neuro:  CNs 2-12 intact, no focal abnormalities noted  EKG:   11/17/13   NSR, HR 64, normal axis, IVCD, poor R wave progression, anterolateral T wave inversions, first degree block with a PR interval of 222    06/15/15  SR rate 67  LBBB PR 212   Assessment and Plan:  CHF: Functional class one does not want heart cath. No symptoms active walking tolerating ARB and beta blocker BNP Much better 500 range  Continue medical Rx  Short Bowel Syndrome stable getting TPN every 4 days new PIC line looks good  Bacteremia resolved off antibiotics no further chills  Carotid f40-59% RICA stenosis f.u duplex  07/2016    Jenkins Rouge

## 2015-09-14 ENCOUNTER — Ambulatory Visit (INDEPENDENT_AMBULATORY_CARE_PROVIDER_SITE_OTHER): Payer: Medicare Other | Admitting: Cardiovascular Disease

## 2015-09-14 ENCOUNTER — Encounter: Payer: Self-pay | Admitting: Cardiovascular Disease

## 2015-09-14 VITALS — BP 110/58 | HR 61 | Ht 68.0 in | Wt 141.0 lb

## 2015-09-14 DIAGNOSIS — I6529 Occlusion and stenosis of unspecified carotid artery: Secondary | ICD-10-CM

## 2015-09-14 DIAGNOSIS — I429 Cardiomyopathy, unspecified: Secondary | ICD-10-CM | POA: Diagnosis not present

## 2015-09-14 DIAGNOSIS — I5022 Chronic systolic (congestive) heart failure: Secondary | ICD-10-CM

## 2015-09-14 NOTE — Patient Instructions (Signed)

## 2015-09-17 ENCOUNTER — Encounter: Payer: Self-pay | Admitting: Internal Medicine

## 2015-09-17 ENCOUNTER — Ambulatory Visit (INDEPENDENT_AMBULATORY_CARE_PROVIDER_SITE_OTHER): Payer: Medicare Other | Admitting: Internal Medicine

## 2015-09-17 VITALS — BP 102/68 | HR 68 | Ht 66.5 in | Wt 135.2 lb

## 2015-09-17 DIAGNOSIS — K912 Postsurgical malabsorption, not elsewhere classified: Secondary | ICD-10-CM

## 2015-09-17 DIAGNOSIS — K90829 Short bowel syndrome, unspecified: Secondary | ICD-10-CM

## 2015-09-17 DIAGNOSIS — I6529 Occlusion and stenosis of unspecified carotid artery: Secondary | ICD-10-CM | POA: Diagnosis not present

## 2015-09-17 DIAGNOSIS — Z860100 Personal history of colon polyps, unspecified: Secondary | ICD-10-CM

## 2015-09-17 DIAGNOSIS — R161 Splenomegaly, not elsewhere classified: Secondary | ICD-10-CM | POA: Diagnosis not present

## 2015-09-17 DIAGNOSIS — K6389 Other specified diseases of intestine: Secondary | ICD-10-CM | POA: Diagnosis not present

## 2015-09-17 MED ORDER — DIPHENOXYLATE-ATROPINE 2.5-0.025 MG PO TABS: 1.0000 | ORAL_TABLET | Freq: Four times a day (QID) | ORAL | Status: DC | PRN

## 2015-09-17 MED ORDER — ONDANSETRON 8 MG PO TBDP: 8.0000 mg | ORAL_TABLET | Freq: Three times a day (TID) | ORAL | Status: DC | PRN

## 2015-09-17 NOTE — Progress Notes (Signed)
   Subjective:    Patient ID: [name] [name], female    DOB: 1950-08-06, 65 y.o.   MRN: [id number] Chief Complaint: Follow-up, short bowel syndrome HPI [name] is here for routine follow-up I saw her last year. She continues on TPN 4 out of 7 nights and continues to take Gattex. Dr. Johnsie Cancel has been providing expert care regarding her heart failure and he is added carvedilol because her ejection fraction drop some again. She denies any dyspnea. She still has some intermittent diarrhea but not as bad as it once was and she is not using her cyclical antibiotics at this point. Her PICC line is now in her left infraclavicular area. She had several episodes of PICC-related sepsis over the past year. She is looking forward to her granddaughter's graduation from high school and starting college. She does not want to do anything until her granddaughter graduates, regarding testing etc.  Medications, allergies, past medical history, past surgical history, family history and social history are reviewed and updated in the EMR.  Review of Systems As above    Objective:   Physical Exam  @BP  102/68 mmHg  Pulse 68  Ht 5' 6.5" (1.689 m)  Wt 135 lb 4 oz (61.349 kg)  BMI 21.51 kg/m2  LMP 02/26/2014@  General:  NAD Eyes:   anicteric Lungs:  clear Heart::  S1S2 no rubs, murmurs or gallops Abdomen:  soft and nontender, BS+, multiple surgical scars, there is scar tissue palpable to the right and inferior to the umbilicus is usual. No hepatosplenomegaly or mass detected. Ext:   no edema, cyanosis or clubbing  Data Reviewed:    Labs are reviewed every other week. She has variable mild transaminase fluctuations, I mild anemia which is followed in hematology, normal kidney function. I reviewed recent cardiology notes and echocardiogram.     Assessment & Plan:   Encounter Diagnoses  Name Primary?  Marland Kitchen Acquired short bowel syndrome Yes  . Bacterial overgrowth syndrome   . Personal history of colonic adenomas    . Splenomegaly    She will continue her current treatment. She's taking a holiday from her antibiotics. She has appropriate concerns about resistance to organisms. There can be some benefits of the liver to the cyclical antibiotics as well. We will readdress this at some point. I want her to have an ultrasound of the abdomen, she's had a history of splenomegaly and I think she could be headed towards cirrhosis given her chronic TPN and we should recheck her liver. Her to consider a colonoscopy later this year to be about 2 years in August.   Gattex can increase the risk of colon polyps and we need closer follow she does have a history of those as well. Might do that at the hospital with a stiffer scope, I wasn't 100% confident that I reached her cecum the last time. She is aware of this but did not want to pursue additional testing then.  Her original insult was infarct, and she's been on anticoagulation therapy until she started [facility], she has done okay without that so far. She is aware of the risk of recurrent thrombosis.  She maintains an amazingly positive attitude given the health conditions she deals with.  Refill Zofran and Lomotil  [id number] Birdie Riddle, MD

## 2015-09-17 NOTE — Assessment & Plan Note (Signed)
TPN 4/7 nights

## 2015-09-17 NOTE — Assessment & Plan Note (Signed)
?   Cirrhosis [place] in July

## 2015-09-17 NOTE — Assessment & Plan Note (Signed)
Seems quiescent.  

## 2015-09-17 NOTE — Patient Instructions (Signed)
   We have sent the following medications to your pharmacy for you to pick up at your convenience: Zofran, Lomotil   Call us back to set up your ultrasound.    Follow up with Dr Carlean Purl in 6 months.     I appreciate the opportunity to care for you.

## 2015-09-17 NOTE — Assessment & Plan Note (Signed)
Consider colonoscopy later

## 2015-10-01 ENCOUNTER — Other Ambulatory Visit (INDEPENDENT_AMBULATORY_CARE_PROVIDER_SITE_OTHER): Payer: Medicare Other

## 2015-10-01 DIAGNOSIS — K912 Postsurgical malabsorption, not elsewhere classified: Secondary | ICD-10-CM

## 2015-10-01 LAB — PHOSPHORUS: PHOSPHORUS: 3.8 mg/dL (ref 2.3–4.6)

## 2015-10-01 LAB — CBC WITH DIFFERENTIAL/PLATELET
BASOS PCT: 0.7 % (ref 0.0–3.0)
Basophils Absolute: 0 10*3/uL (ref 0.0–0.1)
EOS ABS: 0.3 10*3/uL (ref 0.0–0.7)
Eosinophils Relative: 5.2 % — ABNORMAL HIGH (ref 0.0–5.0)
HEMATOCRIT: 32.9 % — AB (ref 36.0–46.0)
HEMOGLOBIN: 10.9 g/dL — AB (ref 12.0–15.0)
LYMPHS ABS: 2 10*3/uL (ref 0.7–4.0)
Lymphocytes Relative: 39.5 % (ref 12.0–46.0)
MCHC: 33 g/dL (ref 30.0–36.0)
MCV: 94.3 fl (ref 78.0–100.0)
MONO ABS: 0.5 10*3/uL (ref 0.1–1.0)
Monocytes Relative: 9.8 % (ref 3.0–12.0)
Neutro Abs: 2.3 10*3/uL (ref 1.4–7.7)
Neutrophils Relative %: 44.8 % (ref 43.0–77.0)
PLATELETS: 118 10*3/uL — AB (ref 150.0–400.0)
RBC: 3.49 Mil/uL — ABNORMAL LOW (ref 3.87–5.11)
RDW: 15.7 % — AB (ref 11.5–15.5)
WBC: 5.1 10*3/uL (ref 4.0–10.5)

## 2015-10-01 LAB — COMPREHENSIVE METABOLIC PANEL
ALBUMIN: 3.6 g/dL (ref 3.5–5.2)
ALT: 16 U/L (ref 0–35)
AST: 16 U/L (ref 0–37)
Alkaline Phosphatase: 62 U/L (ref 39–117)
BUN: 14 mg/dL (ref 6–23)
CHLORIDE: 107 meq/L (ref 96–112)
CO2: 22 mEq/L (ref 19–32)
CREATININE: 0.9 mg/dL (ref 0.40–1.20)
Calcium: 8.8 mg/dL (ref 8.4–10.5)
GFR: 66.7 mL/min (ref >60.00)
Glucose, Bld: 81 mg/dL (ref 70–99)
Potassium: 3.8 mEq/L (ref 3.5–5.1)
SODIUM: 136 meq/L (ref 135–145)
TOTAL PROTEIN: 7.5 g/dL (ref 6.0–8.3)
Total Bilirubin: 0.4 mg/dL (ref 0.2–1.2)

## 2015-10-01 LAB — TRIGLYCERIDES: Triglycerides: 190 mg/dL — ABNORMAL HIGH (ref 0.0–149.0)

## 2015-10-01 LAB — MAGNESIUM: Magnesium: 1.8 mg/dL (ref 1.5–2.5)

## 2015-10-01 LAB — BILIRUBIN, DIRECT: Bilirubin, Direct: 0.1 mg/dL (ref 0.0–0.3)

## 2015-10-01 LAB — C-REACTIVE PROTEIN: CRP: 0.1 mg/dL — ABNORMAL LOW (ref 0.5–20.0)

## 2015-10-02 LAB — PREALBUMIN: Prealbumin: 25 mg/dL (ref 17–34)

## 2015-10-03 NOTE — Progress Notes (Signed)
Quick Note:  Labs ok/stable mild abnl send to pharmacist ______

## 2015-10-29 ENCOUNTER — Other Ambulatory Visit (INDEPENDENT_AMBULATORY_CARE_PROVIDER_SITE_OTHER): Payer: Medicare Other

## 2015-10-29 DIAGNOSIS — K912 Postsurgical malabsorption, not elsewhere classified: Secondary | ICD-10-CM

## 2015-10-29 LAB — CBC WITH DIFFERENTIAL/PLATELET
BASOS PCT: 0.6 % (ref 0.0–3.0)
Basophils Absolute: 0 10*3/uL (ref 0.0–0.1)
EOS ABS: 0.3 10*3/uL (ref 0.0–0.7)
Eosinophils Relative: 6 % — ABNORMAL HIGH (ref 0.0–5.0)
HCT: 32.5 % — ABNORMAL LOW (ref 36.0–46.0)
HEMOGLOBIN: 10.8 g/dL — AB (ref 12.0–15.0)
Lymphocytes Relative: 51.1 % — ABNORMAL HIGH (ref 12.0–46.0)
Lymphs Abs: 2.7 10*3/uL (ref 0.7–4.0)
MCHC: 33.2 g/dL (ref 30.0–36.0)
MCV: 92.9 fl (ref 78.0–100.0)
MONO ABS: 0.5 10*3/uL (ref 0.1–1.0)
Monocytes Relative: 8.6 % (ref 3.0–12.0)
Neutro Abs: 1.8 10*3/uL (ref 1.4–7.7)
Neutrophils Relative %: 33.7 % — ABNORMAL LOW (ref 43.0–77.0)
Platelets: 132 10*3/uL — ABNORMAL LOW (ref 150.0–400.0)
RBC: 3.5 Mil/uL — ABNORMAL LOW (ref 3.87–5.11)
RDW: 14.8 % (ref 11.5–15.5)
WBC: 5.3 10*3/uL (ref 4.0–10.5)

## 2015-10-29 LAB — COMPREHENSIVE METABOLIC PANEL
ALBUMIN: 3.5 g/dL (ref 3.5–5.2)
ALK PHOS: 65 U/L (ref 39–117)
ALT: 18 U/L (ref 0–35)
AST: 18 U/L (ref 0–37)
BUN: 16 mg/dL (ref 6–23)
CHLORIDE: 106 meq/L (ref 96–112)
CO2: 20 mEq/L (ref 19–32)
CREATININE: 0.92 mg/dL (ref 0.40–1.20)
Calcium: 8.8 mg/dL (ref 8.4–10.5)
GFR: 65.02 mL/min (ref >60.00)
Glucose, Bld: 79 mg/dL (ref 70–99)
Potassium: 3.7 mEq/L (ref 3.5–5.1)
Sodium: 137 mEq/L (ref 135–145)
TOTAL PROTEIN: 7.8 g/dL (ref 6.0–8.3)
Total Bilirubin: 0.4 mg/dL (ref 0.2–1.2)

## 2015-10-29 LAB — PHOSPHORUS: PHOSPHORUS: 3.6 mg/dL (ref 2.3–4.6)

## 2015-10-29 LAB — TRIGLYCERIDES: Triglycerides: 171 mg/dL — ABNORMAL HIGH (ref 0.0–149.0)

## 2015-10-29 LAB — BILIRUBIN, DIRECT: Bilirubin, Direct: 0.1 mg/dL (ref 0.0–0.3)

## 2015-10-29 LAB — MAGNESIUM: MAGNESIUM: 1.7 mg/dL (ref 1.5–2.5)

## 2015-10-29 LAB — C-REACTIVE PROTEIN: CRP: 0.1 mg/dL — ABNORMAL LOW (ref 0.5–20.0)

## 2015-10-29 LAB — PREALBUMIN: Prealbumin: 19 mg/dL (ref 17–34)

## 2015-10-29 NOTE — Progress Notes (Signed)
Quick Note:  Please fax to pharmacist that does her tpn ______

## 2015-10-30 NOTE — Progress Notes (Signed)
Quick Note:  Send this to her pharmacist also please ______

## 2015-11-19 ENCOUNTER — Other Ambulatory Visit (INDEPENDENT_AMBULATORY_CARE_PROVIDER_SITE_OTHER): Payer: Medicare Other

## 2015-11-19 DIAGNOSIS — K912 Postsurgical malabsorption, not elsewhere classified: Secondary | ICD-10-CM | POA: Diagnosis not present

## 2015-11-19 LAB — CBC WITH DIFFERENTIAL/PLATELET
Basophils Absolute: 0 10*3/uL (ref 0.0–0.1)
Basophils Relative: 0.5 % (ref 0.0–3.0)
EOS PCT: 6.4 % — AB (ref 0.0–5.0)
Eosinophils Absolute: 0.4 10*3/uL (ref 0.0–0.7)
HCT: 35 % — ABNORMAL LOW (ref 36.0–46.0)
Hemoglobin: 11.6 g/dL — ABNORMAL LOW (ref 12.0–15.0)
LYMPHS ABS: 2.6 10*3/uL (ref 0.7–4.0)
Lymphocytes Relative: 40.9 % (ref 12.0–46.0)
MCHC: 33.2 g/dL (ref 30.0–36.0)
MCV: 93.1 fl (ref 78.0–100.0)
MONO ABS: 0.6 10*3/uL (ref 0.1–1.0)
Monocytes Relative: 10 % (ref 3.0–12.0)
NEUTROS PCT: 42.2 % — AB (ref 43.0–77.0)
Neutro Abs: 2.7 10*3/uL (ref 1.4–7.7)
Platelets: 129 10*3/uL — ABNORMAL LOW (ref 150.0–400.0)
RBC: 3.76 Mil/uL — AB (ref 3.87–5.11)
RDW: 14.8 % (ref 11.5–15.5)
WBC: 6.4 10*3/uL (ref 4.0–10.5)

## 2015-11-19 LAB — C-REACTIVE PROTEIN: CRP: 0.1 mg/dL — ABNORMAL LOW (ref 0.5–20.0)

## 2015-11-19 LAB — PHOSPHORUS: PHOSPHORUS: 4 mg/dL (ref 2.3–4.6)

## 2015-11-19 LAB — COMPREHENSIVE METABOLIC PANEL
ALK PHOS: 71 U/L (ref 39–117)
ALT: 17 U/L (ref 0–35)
AST: 17 U/L (ref 0–37)
Albumin: 3.6 g/dL (ref 3.5–5.2)
BILIRUBIN TOTAL: 0.5 mg/dL (ref 0.2–1.2)
BUN: 17 mg/dL (ref 6–23)
CO2: 24 mEq/L (ref 19–32)
Calcium: 9 mg/dL (ref 8.4–10.5)
Chloride: 106 mEq/L (ref 96–112)
Creatinine, Ser: 1.03 mg/dL (ref 0.40–1.20)
GFR: 57.06 mL/min — ABNORMAL LOW (ref >60.00)
GLUCOSE: 89 mg/dL (ref 70–99)
POTASSIUM: 3.7 meq/L (ref 3.5–5.1)
SODIUM: 135 meq/L (ref 135–145)
TOTAL PROTEIN: 8 g/dL (ref 6.0–8.3)

## 2015-11-19 LAB — TRIGLYCERIDES: Triglycerides: 194 mg/dL — ABNORMAL HIGH (ref 0.0–149.0)

## 2015-11-19 LAB — MAGNESIUM: Magnesium: 1.9 mg/dL (ref 1.5–2.5)

## 2015-11-19 LAB — BILIRUBIN, DIRECT: BILIRUBIN DIRECT: 0.1 mg/dL (ref 0.0–0.3)

## 2015-11-20 LAB — PREALBUMIN: PREALBUMIN: 23 mg/dL (ref 17–34)

## 2015-11-21 NOTE — Progress Notes (Signed)
Quick Note:  L:abs ok Forward all to pharmacist pklease ______

## 2015-12-11 ENCOUNTER — Other Ambulatory Visit: Payer: Self-pay | Admitting: Internal Medicine

## 2015-12-12 ENCOUNTER — Other Ambulatory Visit (INDEPENDENT_AMBULATORY_CARE_PROVIDER_SITE_OTHER): Payer: Medicare Other

## 2015-12-12 ENCOUNTER — Other Ambulatory Visit: Payer: Self-pay

## 2015-12-12 DIAGNOSIS — K912 Postsurgical malabsorption, not elsewhere classified: Secondary | ICD-10-CM

## 2015-12-12 LAB — COMPREHENSIVE METABOLIC PANEL
ALBUMIN: 3.6 g/dL (ref 3.5–5.2)
ALT: 16 U/L (ref 0–35)
AST: 15 U/L (ref 0–37)
Alkaline Phosphatase: 64 U/L (ref 39–117)
BUN: 24 mg/dL — ABNORMAL HIGH (ref 6–23)
CALCIUM: 9.2 mg/dL (ref 8.4–10.5)
CHLORIDE: 108 meq/L (ref 96–112)
CO2: 20 mEq/L (ref 19–32)
CREATININE: 1.02 mg/dL (ref 0.40–1.20)
GFR: 57.7 mL/min — AB (ref >60.00)
Glucose, Bld: 91 mg/dL (ref 70–99)
Potassium: 3.7 mEq/L (ref 3.5–5.1)
Sodium: 138 mEq/L (ref 135–145)
Total Bilirubin: 0.5 mg/dL (ref 0.2–1.2)
Total Protein: 8.1 g/dL (ref 6.0–8.3)

## 2015-12-12 LAB — CBC WITH DIFFERENTIAL/PLATELET
BASOS ABS: 0 10*3/uL (ref 0.0–0.1)
BASOS PCT: 0.6 % (ref 0.0–3.0)
EOS ABS: 0.3 10*3/uL (ref 0.0–0.7)
Eosinophils Relative: 6.8 % — ABNORMAL HIGH (ref 0.0–5.0)
HEMATOCRIT: 32.6 % — AB (ref 36.0–46.0)
HEMOGLOBIN: 10.9 g/dL — AB (ref 12.0–15.0)
LYMPHS PCT: 45.5 % (ref 12.0–46.0)
Lymphs Abs: 1.9 10*3/uL (ref 0.7–4.0)
MCHC: 33.6 g/dL (ref 30.0–36.0)
MCV: 92.8 fl (ref 78.0–100.0)
Monocytes Absolute: 0.4 10*3/uL (ref 0.1–1.0)
Monocytes Relative: 10.4 % (ref 3.0–12.0)
Neutro Abs: 1.5 10*3/uL (ref 1.4–7.7)
Neutrophils Relative %: 36.7 % — ABNORMAL LOW (ref 43.0–77.0)
Platelets: 107 10*3/uL — ABNORMAL LOW (ref 150.0–400.0)
RBC: 3.51 Mil/uL — ABNORMAL LOW (ref 3.87–5.11)
RDW: 15 % (ref 11.5–15.5)
WBC: 4.1 10*3/uL (ref 4.0–10.5)

## 2015-12-12 LAB — PHOSPHORUS: PHOSPHORUS: 4.1 mg/dL (ref 2.3–4.6)

## 2015-12-12 LAB — TRIGLYCERIDES: TRIGLYCERIDES: 160 mg/dL — AB (ref 0.0–149.0)

## 2015-12-12 LAB — MAGNESIUM: Magnesium: 1.9 mg/dL (ref 1.5–2.5)

## 2015-12-12 LAB — C-REACTIVE PROTEIN: CRP: 0.7 mg/dL (ref 0.5–20.0)

## 2015-12-12 LAB — BILIRUBIN, DIRECT: Bilirubin, Direct: 0.1 mg/dL (ref 0.0–0.3)

## 2015-12-12 MED ORDER — DIPHENOXYLATE-ATROPINE 2.5-0.025 MG PO TABS: 1.0000 | ORAL_TABLET | Freq: Four times a day (QID) | ORAL | 0 refills | Status: DC | PRN

## 2015-12-13 LAB — PREALBUMIN: Prealbumin: 22 mg/dL (ref 17–34)

## 2015-12-14 NOTE — Progress Notes (Signed)
Labs ok/stable mild abnormalities Please forward to pharmacist

## 2015-12-17 ENCOUNTER — Telehealth: Payer: Self-pay | Admitting: Internal Medicine

## 2015-12-17 NOTE — Telephone Encounter (Signed)
Labs faxed again.

## 2015-12-18 ENCOUNTER — Telehealth: Payer: Self-pay | Admitting: Internal Medicine

## 2015-12-18 DIAGNOSIS — R161 Splenomegaly, not elsewhere classified: Secondary | ICD-10-CM

## 2015-12-18 MED ORDER — ABDOMINAL BINDER/ELASTIC MED MISC: 0 refills | Status: DC

## 2015-12-18 MED ORDER — CIPROFLOXACIN HCL 500 MG PO TABS: ORAL_TABLET | ORAL | 3 refills | Status: DC

## 2015-12-18 NOTE — Telephone Encounter (Signed)
OK to give her 3 refills on cipro for SIBO Ask her to schedule the ultrasound abdomen re: splenomegaly  I would also like her to schedule colonoscopy hx polyps if she will - ok to try in the [facility] but will want to order a medium abdominal binder to have to see if that helps scope get around

## 2015-12-18 NOTE — Telephone Encounter (Signed)
Dr. Carlean Purl she is asking for refill of cipro.  Please advise.  Last note says off of it for now.  Should she have colonoscopy as well as [place] scheduled?

## 2015-12-18 NOTE — Telephone Encounter (Signed)
Patient is scheduled for [place] on 12/25/15 8:45 arrival for 9:00 She is scheduled for a colonoscopy in the [place] for 02/13/16, she will come for a previsit prior.  She understands to pick up abdominal binder that has been sent to her pharmacy and bring it to the appt.

## 2015-12-25 ENCOUNTER — Ambulatory Visit (HOSPITAL_COMMUNITY): Admission: RE | Admit: 2015-12-25 | Payer: Medicare Other | Source: Ambulatory Visit

## 2016-01-07 ENCOUNTER — Other Ambulatory Visit (INDEPENDENT_AMBULATORY_CARE_PROVIDER_SITE_OTHER): Payer: Medicare Other

## 2016-01-07 DIAGNOSIS — K912 Postsurgical malabsorption, not elsewhere classified: Secondary | ICD-10-CM | POA: Diagnosis not present

## 2016-01-07 LAB — COMPREHENSIVE METABOLIC PANEL
ALK PHOS: 63 U/L (ref 39–117)
ALT: 15 U/L (ref 0–35)
AST: 15 U/L (ref 0–37)
Albumin: 3.7 g/dL (ref 3.5–5.2)
BILIRUBIN TOTAL: 0.4 mg/dL (ref 0.2–1.2)
BUN: 20 mg/dL (ref 6–23)
CO2: 23 meq/L (ref 19–32)
Calcium: 8.8 mg/dL (ref 8.4–10.5)
Chloride: 107 mEq/L (ref 96–112)
Creatinine, Ser: 1.04 mg/dL (ref 0.40–1.20)
GFR: 56.41 mL/min — AB (ref >60.00)
GLUCOSE: 93 mg/dL (ref 70–99)
POTASSIUM: 3.7 meq/L (ref 3.5–5.1)
SODIUM: 137 meq/L (ref 135–145)
TOTAL PROTEIN: 8.1 g/dL (ref 6.0–8.3)

## 2016-01-07 LAB — C-REACTIVE PROTEIN: CRP: 0.1 mg/dL — ABNORMAL LOW (ref 0.5–20.0)

## 2016-01-07 LAB — CBC WITH DIFFERENTIAL/PLATELET
BASOS ABS: 0 10*3/uL (ref 0.0–0.1)
Basophils Relative: 0.5 % (ref 0.0–3.0)
Eosinophils Absolute: 0.2 10*3/uL (ref 0.0–0.7)
Eosinophils Relative: 3.9 % (ref 0.0–5.0)
HCT: 33.1 % — ABNORMAL LOW (ref 36.0–46.0)
HEMOGLOBIN: 11 g/dL — AB (ref 12.0–15.0)
LYMPHS ABS: 2.2 10*3/uL (ref 0.7–4.0)
Lymphocytes Relative: 35 % (ref 12.0–46.0)
MCHC: 33.3 g/dL (ref 30.0–36.0)
MCV: 93.3 fl (ref 78.0–100.0)
MONOS PCT: 8.7 % (ref 3.0–12.0)
Monocytes Absolute: 0.5 10*3/uL (ref 0.1–1.0)
NEUTROS PCT: 51.9 % (ref 43.0–77.0)
Neutro Abs: 3.2 10*3/uL (ref 1.4–7.7)
Platelets: 134 10*3/uL — ABNORMAL LOW (ref 150.0–400.0)
RBC: 3.54 Mil/uL — AB (ref 3.87–5.11)
RDW: 14.9 % (ref 11.5–15.5)
WBC: 6.1 10*3/uL (ref 4.0–10.5)

## 2016-01-07 LAB — PHOSPHORUS: Phosphorus: 4.5 mg/dL (ref 2.3–4.6)

## 2016-01-07 LAB — TRIGLYCERIDES: Triglycerides: 202 mg/dL — ABNORMAL HIGH (ref 0.0–149.0)

## 2016-01-07 LAB — BILIRUBIN, DIRECT: Bilirubin, Direct: 0 mg/dL (ref 0.0–0.3)

## 2016-01-07 LAB — MAGNESIUM: MAGNESIUM: 1.7 mg/dL (ref 1.5–2.5)

## 2016-01-08 LAB — PREALBUMIN: PREALBUMIN: 21 mg/dL (ref 17–34)

## 2016-01-08 NOTE — Progress Notes (Signed)
Normal or stable Send to pharmacist please

## 2016-01-17 ENCOUNTER — Other Ambulatory Visit: Payer: Self-pay

## 2016-01-17 ENCOUNTER — Telehealth: Payer: Self-pay | Admitting: *Deleted

## 2016-01-17 DIAGNOSIS — Z1211 Encounter for screening for malignant neoplasm of colon: Secondary | ICD-10-CM

## 2016-01-17 NOTE — Telephone Encounter (Signed)
Based upon our criteria needs to be done at the hospital  I am hospital doc week of 10/9 BUT am trying to get out of jury duty and until I know am avoiding scheduling patients then  See if she can do 11/7 - I have slots at [facility] that day  If not will try to work something else out - let me know  Thx

## 2016-01-17 NOTE — Telephone Encounter (Signed)
Dr Carlean Purl,   I know you saw this pt back in August and in another TE you  stated [facility] colon, how ever her last EF  07-19-15 was 25-30%.   Do you still want to have her colon in the [facility] or hospital  please advise, Thanks Lelan Pons PV

## 2016-01-17 NOTE — Telephone Encounter (Signed)
Patient rescheduled to November.  She is agreeable.  I rescheduled pre-visit as well to a later date

## 2016-01-17 NOTE — Telephone Encounter (Signed)
Will forward to [name] to get scheduled. Thanks!!  [name] [name]

## 2016-01-28 ENCOUNTER — Other Ambulatory Visit (INDEPENDENT_AMBULATORY_CARE_PROVIDER_SITE_OTHER): Payer: Medicare Other

## 2016-01-28 DIAGNOSIS — K912 Postsurgical malabsorption, not elsewhere classified: Secondary | ICD-10-CM | POA: Diagnosis not present

## 2016-01-28 LAB — COMPREHENSIVE METABOLIC PANEL
ALK PHOS: 75 U/L (ref 39–117)
ALT: 18 U/L (ref 0–35)
AST: 16 U/L (ref 0–37)
Albumin: 3.6 g/dL (ref 3.5–5.2)
BILIRUBIN TOTAL: 0.4 mg/dL (ref 0.2–1.2)
BUN: 24 mg/dL — ABNORMAL HIGH (ref 6–23)
CALCIUM: 8.6 mg/dL (ref 8.4–10.5)
CO2: 23 meq/L (ref 19–32)
Chloride: 108 mEq/L (ref 96–112)
Creatinine, Ser: 1.09 mg/dL (ref 0.40–1.20)
GFR: 53.42 mL/min — AB (ref >60.00)
Glucose, Bld: 106 mg/dL — ABNORMAL HIGH (ref 70–99)
POTASSIUM: 3.6 meq/L (ref 3.5–5.1)
Sodium: 138 mEq/L (ref 135–145)
Total Protein: 8 g/dL (ref 6.0–8.3)

## 2016-01-28 LAB — CBC WITH DIFFERENTIAL/PLATELET
BASOS ABS: 0 10*3/uL (ref 0.0–0.1)
Basophils Relative: 0.6 % (ref 0.0–3.0)
EOS PCT: 3.7 % (ref 0.0–5.0)
Eosinophils Absolute: 0.2 10*3/uL (ref 0.0–0.7)
HEMATOCRIT: 32.7 % — AB (ref 36.0–46.0)
Hemoglobin: 10.9 g/dL — ABNORMAL LOW (ref 12.0–15.0)
LYMPHS PCT: 37.2 % (ref 12.0–46.0)
Lymphs Abs: 1.9 10*3/uL (ref 0.7–4.0)
MCHC: 33.4 g/dL (ref 30.0–36.0)
MCV: 93.6 fl (ref 78.0–100.0)
MONOS PCT: 10.6 % (ref 3.0–12.0)
Monocytes Absolute: 0.6 10*3/uL (ref 0.1–1.0)
NEUTROS ABS: 2.5 10*3/uL (ref 1.4–7.7)
Neutrophils Relative %: 47.9 % (ref 43.0–77.0)
Platelets: 122 10*3/uL — ABNORMAL LOW (ref 150.0–400.0)
RBC: 3.49 Mil/uL — AB (ref 3.87–5.11)
RDW: 14.7 % (ref 11.5–15.5)
WBC: 5.2 10*3/uL (ref 4.0–10.5)

## 2016-01-28 LAB — BILIRUBIN, DIRECT: Bilirubin, Direct: 0.1 mg/dL (ref 0.0–0.3)

## 2016-01-28 LAB — PHOSPHORUS: Phosphorus: 4.9 mg/dL — ABNORMAL HIGH (ref 2.3–4.6)

## 2016-01-28 LAB — TRIGLYCERIDES: TRIGLYCERIDES: 185 mg/dL — AB (ref 0.0–149.0)

## 2016-01-28 LAB — MAGNESIUM: Magnesium: 1.7 mg/dL (ref 1.5–2.5)

## 2016-01-28 LAB — C-REACTIVE PROTEIN: CRP: 0.4 mg/dL — ABNORMAL LOW (ref 0.5–20.0)

## 2016-01-29 LAB — PREALBUMIN: Prealbumin: 21 mg/dL (ref 17–34)

## 2016-01-30 NOTE — Progress Notes (Signed)
Labs normal or mildly stable abnormal Please cc TPN pharmacist

## 2016-01-31 ENCOUNTER — Ambulatory Visit (INDEPENDENT_AMBULATORY_CARE_PROVIDER_SITE_OTHER): Payer: Medicare Other

## 2016-01-31 DIAGNOSIS — Z23 Encounter for immunization: Secondary | ICD-10-CM | POA: Diagnosis not present

## 2016-02-13 ENCOUNTER — Encounter: Payer: Medicare Other | Admitting: Internal Medicine

## 2016-02-14 ENCOUNTER — Other Ambulatory Visit: Payer: Self-pay | Admitting: Internal Medicine

## 2016-02-25 ENCOUNTER — Other Ambulatory Visit (INDEPENDENT_AMBULATORY_CARE_PROVIDER_SITE_OTHER): Payer: Medicare Other

## 2016-02-25 DIAGNOSIS — Z79899 Other long term (current) drug therapy: Secondary | ICD-10-CM | POA: Diagnosis not present

## 2016-02-25 DIAGNOSIS — K912 Postsurgical malabsorption, not elsewhere classified: Secondary | ICD-10-CM

## 2016-02-25 LAB — CBC WITH DIFFERENTIAL/PLATELET
BASOS ABS: 0 10*3/uL (ref 0.0–0.1)
BASOS PCT: 0.7 % (ref 0.0–3.0)
Eosinophils Absolute: 0.2 10*3/uL (ref 0.0–0.7)
Eosinophils Relative: 3.8 % (ref 0.0–5.0)
HEMATOCRIT: 30.5 % — AB (ref 36.0–46.0)
Hemoglobin: 10.1 g/dL — ABNORMAL LOW (ref 12.0–15.0)
LYMPHS PCT: 40 % (ref 12.0–46.0)
Lymphs Abs: 1.7 10*3/uL (ref 0.7–4.0)
MCHC: 33.3 g/dL (ref 30.0–36.0)
MCV: 93.8 fl (ref 78.0–100.0)
MONOS PCT: 11.3 % (ref 3.0–12.0)
Monocytes Absolute: 0.5 10*3/uL (ref 0.1–1.0)
NEUTROS ABS: 1.9 10*3/uL (ref 1.4–7.7)
Neutrophils Relative %: 44.2 % (ref 43.0–77.0)
PLATELETS: 128 10*3/uL — AB (ref 150.0–400.0)
RBC: 3.25 Mil/uL — ABNORMAL LOW (ref 3.87–5.11)
RDW: 14.5 % (ref 11.5–15.5)
WBC: 4.2 10*3/uL (ref 4.0–10.5)

## 2016-02-25 LAB — C-REACTIVE PROTEIN: CRP: 0.6 mg/dL (ref 0.5–20.0)

## 2016-02-25 LAB — COMPREHENSIVE METABOLIC PANEL
ALT: 16 U/L (ref 0–35)
AST: 16 U/L (ref 0–37)
Albumin: 3.6 g/dL (ref 3.5–5.2)
Alkaline Phosphatase: 64 U/L (ref 39–117)
BUN: 19 mg/dL (ref 6–23)
CALCIUM: 8.9 mg/dL (ref 8.4–10.5)
CHLORIDE: 106 meq/L (ref 96–112)
CO2: 22 meq/L (ref 19–32)
Creatinine, Ser: 1.01 mg/dL (ref 0.40–1.20)
GFR: 58.32 mL/min — AB (ref >60.00)
Glucose, Bld: 85 mg/dL (ref 70–99)
Potassium: 3.6 mEq/L (ref 3.5–5.1)
Sodium: 137 mEq/L (ref 135–145)
Total Bilirubin: 0.5 mg/dL (ref 0.2–1.2)
Total Protein: 8.1 g/dL (ref 6.0–8.3)

## 2016-02-25 LAB — TRIGLYCERIDES: TRIGLYCERIDES: 179 mg/dL — AB (ref 0.0–149.0)

## 2016-02-25 LAB — MAGNESIUM: MAGNESIUM: 1.9 mg/dL (ref 1.5–2.5)

## 2016-02-25 LAB — BILIRUBIN, DIRECT: Bilirubin, Direct: 0.1 mg/dL (ref 0.0–0.3)

## 2016-02-25 LAB — PHOSPHORUS: Phosphorus: 4.3 mg/dL (ref 2.3–4.6)

## 2016-02-26 LAB — PREALBUMIN: PREALBUMIN: 20 mg/dL (ref 17–34)

## 2016-02-26 NOTE — Progress Notes (Signed)
Labs stable/ok Cc pharmacist please

## 2016-02-26 NOTE — Progress Notes (Signed)
Cc pharmacist

## 2016-03-04 ENCOUNTER — Ambulatory Visit (AMBULATORY_SURGERY_CENTER): Payer: Self-pay

## 2016-03-04 VITALS — Ht 68.0 in | Wt 138.0 lb

## 2016-03-04 DIAGNOSIS — Z1211 Encounter for screening for malignant neoplasm of colon: Secondary | ICD-10-CM

## 2016-03-04 NOTE — Progress Notes (Signed)
No allergies to eggs soy No past problems with anesthesia No diet meds No home oxyge  Declined emmi

## 2016-03-11 ENCOUNTER — Other Ambulatory Visit: Payer: Self-pay | Admitting: Internal Medicine

## 2016-03-12 ENCOUNTER — Encounter (HOSPITAL_COMMUNITY): Payer: Self-pay | Admitting: *Deleted

## 2016-03-18 ENCOUNTER — Ambulatory Visit (HOSPITAL_COMMUNITY): Payer: Medicare Other | Admitting: Anesthesiology

## 2016-03-18 ENCOUNTER — Encounter (HOSPITAL_COMMUNITY)
Admission: RE | Disposition: A | Discharge status: Home / Self Care | Payer: Self-pay | Source: Ambulatory Visit | Attending: Internal Medicine

## 2016-03-18 ENCOUNTER — Ambulatory Visit (HOSPITAL_COMMUNITY)
Admission: RE | Admit: 2016-03-18 | Discharge: 2016-03-18 | Disposition: A | Discharge status: Home / Self Care | Payer: Medicare Other | Source: Ambulatory Visit | Attending: Internal Medicine | Admitting: Internal Medicine

## 2016-03-18 ENCOUNTER — Encounter (HOSPITAL_COMMUNITY): Payer: Self-pay | Admitting: Internal Medicine

## 2016-03-18 DIAGNOSIS — D649 Anemia, unspecified: Secondary | ICD-10-CM | POA: Diagnosis not present

## 2016-03-18 DIAGNOSIS — K562 Volvulus: Secondary | ICD-10-CM | POA: Insufficient documentation

## 2016-03-18 DIAGNOSIS — D6859 Other primary thrombophilia: Secondary | ICD-10-CM | POA: Diagnosis not present

## 2016-03-18 DIAGNOSIS — Z980 Intestinal bypass and anastomosis status: Secondary | ICD-10-CM | POA: Insufficient documentation

## 2016-03-18 DIAGNOSIS — K5730 Diverticulosis of large intestine without perforation or abscess without bleeding: Secondary | ICD-10-CM | POA: Insufficient documentation

## 2016-03-18 DIAGNOSIS — Z79899 Other long term (current) drug therapy: Secondary | ICD-10-CM | POA: Diagnosis not present

## 2016-03-18 DIAGNOSIS — M810 Age-related osteoporosis without current pathological fracture: Secondary | ICD-10-CM | POA: Diagnosis not present

## 2016-03-18 DIAGNOSIS — Z7982 Long term (current) use of aspirin: Secondary | ICD-10-CM | POA: Diagnosis not present

## 2016-03-18 DIAGNOSIS — I509 Heart failure, unspecified: Secondary | ICD-10-CM | POA: Insufficient documentation

## 2016-03-18 DIAGNOSIS — Z87891 Personal history of nicotine dependence: Secondary | ICD-10-CM | POA: Diagnosis not present

## 2016-03-18 DIAGNOSIS — E119 Type 2 diabetes mellitus without complications: Secondary | ICD-10-CM | POA: Insufficient documentation

## 2016-03-18 DIAGNOSIS — Z1211 Encounter for screening for malignant neoplasm of colon: Secondary | ICD-10-CM | POA: Diagnosis not present

## 2016-03-18 DIAGNOSIS — E559 Vitamin D deficiency, unspecified: Secondary | ICD-10-CM | POA: Insufficient documentation

## 2016-03-18 DIAGNOSIS — Z86010 Personal history of colonic polyps: Secondary | ICD-10-CM | POA: Diagnosis not present

## 2016-03-18 DIAGNOSIS — D61818 Other pancytopenia: Secondary | ICD-10-CM | POA: Insufficient documentation

## 2016-03-18 HISTORY — PX: COLONOSCOPY WITH PROPOFOL: SHX5780

## 2016-03-18 SURGERY — COLONOSCOPY WITH PROPOFOL
Anesthesia: Monitor Anesthesia Care

## 2016-03-18 MED ORDER — SODIUM CHLORIDE 0.9 % IV SOLN: INTRAVENOUS | Status: DC

## 2016-03-18 MED ORDER — PROPOFOL 500 MG/50ML IV EMUL
INTRAVENOUS | Status: DC | PRN
  Administered 2016-03-18: 250 ug/kg/min via INTRAVENOUS

## 2016-03-18 MED ORDER — PROPOFOL 10 MG/ML IV BOLUS
INTRAVENOUS | Status: AC
  Filled 2016-03-18: qty 40

## 2016-03-18 MED ORDER — LACTATED RINGERS IV SOLN
INTRAVENOUS | Status: DC
  Administered 2016-03-18: 10:00:00 via INTRAVENOUS

## 2016-03-18 SURGICAL SUPPLY — 22 items

## 2016-03-18 NOTE — Anesthesia Postprocedure Evaluation (Signed)
Anesthesia Post Note  Patient: [name] [name]  Procedure(s) Performed: Procedure(s) (LRB): COLONOSCOPY WITH PROPOFOL (N/A)  Patient location during evaluation: PACU Anesthesia Type: MAC Level of consciousness: awake and alert and oriented Pain management: pain level controlled Vital Signs Assessment: post-procedure vital signs reviewed and stable Respiratory status: spontaneous breathing, nonlabored ventilation and respiratory function stable Cardiovascular status: stable and blood pressure returned to baseline Postop Assessment: no signs of nausea or vomiting Anesthetic complications: no    Last Vitals:  Vitals:   03/18/16 1047 03/18/16 1050  BP: (!) 82/36 (!) 91/40  Pulse: (!) 49 (!) 49  Resp: 11 16  Temp: 37.1 C     Last Pain:  Vitals:   03/18/16 1047  TempSrc: Oral                 [name]

## 2016-03-18 NOTE — Anesthesia Preprocedure Evaluation (Addendum)
Anesthesia Evaluation  Patient identified by MRN, date of birth, ID band Patient awake    Reviewed: Allergy & Precautions, NPO status , Patient's Chart, lab work & pertinent test results, reviewed documented beta blocker date and time   Airway Mallampati: II  TM Distance: >3 FB Neck ROM: Full    Dental no notable dental hx. (+) Teeth Intact   Pulmonary pneumonia, resolved, former smoker,    Pulmonary exam normal breath sounds clear to auscultation       Cardiovascular hypertension, Pt. on medications and Pt. on home beta blockers + Peripheral Vascular Disease and +CHF  Normal cardiovascular exam Rhythm:Regular Rate:Normal  Carotid stenosis <50% bilaterally EF 45%   Neuro/Psych negative neurological ROS  negative psych ROS   GI/Hepatic GERD  Medicated and Controlled,Hx/o Short bowel syndrome S/P multiple small bowel resections and right hemicolectomy S/P mesenteric thrombosis   Endo/Other  negative endocrine ROSdiabetesOsteoporosis  Renal/GU Renal InsufficiencyRenal diseaseHx/o renal insufficiency  negative genitourinary   Musculoskeletal   Abdominal   Peds  Hematology  (+) anemia , Thrombocytopenia Thrombophilia   Anesthesia Other Findings   Reproductive/Obstetrics                              Chemistry      Component Value Date/Time   NA 137 02/25/2016 0957   NA 139 01/13/2014 0951   K 3.6 02/25/2016 0957   K 3.6 01/13/2014 0951   CL 106 02/25/2016 0957   CL 106 10/22/2012 1005   CO2 22 02/25/2016 0957   CO2 21 (L) 01/13/2014 0951   BUN 19 02/25/2016 0957   BUN 14.6 01/13/2014 0951   CREATININE 1.01 02/25/2016 0957   CREATININE 1.0 01/13/2014 0951      Component Value Date/Time   CALCIUM 8.9 02/25/2016 0957   CALCIUM 8.9 01/13/2014 0951   ALKPHOS 64 02/25/2016 0957   ALKPHOS 85 01/13/2014 0951   AST 16 02/25/2016 0957   AST 19 01/13/2014 0951   ALT 16 02/25/2016 0957   ALT  20 01/13/2014 0951   BILITOT 0.5 02/25/2016 0957   BILITOT 0.26 01/13/2014 0951     Lab Results  Component Value Date   WBC 4.2 02/25/2016   HGB 10.1 (L) 02/25/2016   HCT 30.5 (L) 02/25/2016   MCV 93.8 02/25/2016   PLT 128.0 (L) 02/25/2016    EKG: normal sinus rhythm, LBBB. Echo 09/26/13: Left ventricle: Inferior and apical hypokinesis. The cavity size was mildly dilated. Wall thickness was normal. The estimated ejection fraction was 45%. - Aortic valve: Valve area (VTI): 2.28 cm^2. Valve area (Vmax): 2.4 cm^2. - Mitral valve: There was mild regurgitation. - Atrial septum: No defect or patent foramen ovale was identified. Anesthesia Physical Anesthesia Plan  ASA: III  Anesthesia Plan: MAC   Post-op Pain Management:    Induction: Intravenous  Airway Management Planned: Natural Airway  Additional Equipment:   Intra-op Plan:   Post-operative Plan:   Informed Consent: I have reviewed the patients History and Physical, chart, labs and discussed the procedure including the risks, benefits and alternatives for the proposed anesthesia with the patient or authorized representative who has indicated his/her understanding and acceptance.     Plan Discussed with: Anesthesiologist, CRNA and Surgeon  Anesthesia Plan Comments:         Anesthesia Quick Evaluation

## 2016-03-18 NOTE — Op Note (Signed)
[facility] Patient Name: [name] [name] Procedure Date: 03/18/2016 MRN: [id number] Attending MD: Gatha Mayer , MD Date of Birth: Jun 16, 1950 CSN: [id number] Age: 65 Admit Type: Outpatient Procedure:                Colonoscopy Indications:              Personal history of colonic polyps Providers:                Gatha Mayer, MD, Elmer Ramp. Tilden Dome, RN, Elspeth Cho Tech., Technician, Enrigue Catena, CRNA Referring MD:              Medicines:                Propofol per Anesthesia, Monitored Anesthesia Care Complications:            No immediate complications. Estimated Blood Loss:     Estimated blood loss: none. Procedure:                Pre-Anesthesia Assessment:                           - Prior to the procedure, a History and Physical                            was performed, and patient medications and                            allergies were reviewed. The patient's tolerance of                            previous anesthesia was also reviewed. The risks                            and benefits of the procedure and the sedation                            options and risks were discussed with the patient.                            All questions were answered, and informed consent                            was obtained. Prior Anticoagulants: The patient                            last took aspirin 1 day prior to the procedure. ASA                            Grade Assessment: III - A patient with severe                            systemic disease. After reviewing the risks and  benefits, the patient was deemed in satisfactory                            condition to undergo the procedure.                           After obtaining informed consent, the colonoscope                            was passed under direct vision. Throughout the                            procedure, the patient's blood pressure, pulse, and                           oxygen saturations were monitored continuously. The                            [id number] scope was introduced through                            the anus and advanced to the the jejunoocolonic                            anastomosis. The colonoscopy was somewhat difficult                            due to significant looping. Successful completion                            of the procedure was aided by using manual                            pressure. The patient tolerated the procedure well.                            The quality of the bowel preparation was good. The                            bowel preparation used was Miralax. And                            jejuno-colic anastomosis were photographed. Scope In: 10:15:37 AM Scope Out: 10:41:03 AM Scope Withdrawal Time: 0 hours 14 minutes 53 seconds  Total Procedure Duration: 0 hours 25 minutes 26 seconds  Findings:      The perianal and digital rectal examinations were normal.      Multiple diverticula were found in the sigmoid colon.      The exam was otherwise without abnormality on direct and retroflexion       views. Impression:               - Diverticulosis in the sigmoid colon.                           - The  examination was otherwise normal on direct                            and retroflexion views.                           - No specimens collected. Moderate Sedation:      Please see anesthesia notes, moderate sedation not given Recommendation:           - Patient has a contact number available for                            emergencies. The signs and symptoms of potential                            delayed complications were discussed with the                            patient. Return to normal activities tomorrow.                            Written discharge instructions were provided to the                            patient.                           - Resume previous diet.                            - Continue present medications.                           - Repeat colonoscopy in 5 years for surveillance.                           - Return to GI clinic in 6 months.                           -                           SHE IS AWARE THERE IS AN OUTSANDING ABDOMINAL                            ULTRASOUND TO BE COMPLETED - RECOMMENDED AT LAST                            OFFICE VISIT - SHE SAYS SHE WILL SCHEDULE Procedure Code(s):        --- Professional ---                           [phone], Colonoscopy, flexible; diagnostic, including                            collection of specimen(s) by brushing or washing,  when performed (separate procedure) Diagnosis Code(s):        --- Professional ---                           Z86.010, Personal history of colonic polyps                           K57.30, Diverticulosis of large intestine without                            perforation or abscess without bleeding CPT copyright 2016 [employer]. All rights reserved. The codes documented in this report are preliminary and upon coder review may  be revised to meet current compliance requirements. Gatha Mayer, MD 03/18/2016 11:07:22 AM This report has been signed electronically. Number of Addenda: 0

## 2016-03-18 NOTE — Discharge Instructions (Addendum)
° °  No polyps - Your next routine colonoscopy should be in 5 years - 2022.  Call Dr. Celesta Aver office to arrange the abdominal ultrasound when you can - unless you already have an appointment.  YOU HAD AN ENDOSCOPIC PROCEDURE TODAY: Refer to the procedure report and other information in the discharge instructions given to you for any specific questions about what was found during the examination. If this information does not answer your questions, please call Dr. Celesta Aver office at [phone] to clarify.   YOU SHOULD EXPECT: Some feelings of bloating in the abdomen. Passage of more gas than usual. Walking can help get rid of the air that was put into your GI tract during the procedure and reduce the bloating. If you had a lower endoscopy (such as a colonoscopy or flexible sigmoidoscopy) you may notice spotting of blood in your stool or on the toilet paper. Some abdominal soreness may be present for a day or two, also.  DIET: Your first meal following the procedure should be a light meal and then it is ok to progress to your normal diet. A half-sandwich or bowl of soup is an example of a good first meal. Heavy or fried foods are harder to digest and may make you feel nauseous or bloated. Drink plenty of fluids but you should avoid alcoholic beverages for 24 hours.   ACTIVITY: Your care partner should take you home directly after the procedure. You should plan to take it easy, moving slowly for the rest of the day. You can resume normal activity the day after the procedure however YOU SHOULD NOT DRIVE, use power tools, machinery or perform tasks that involve climbing or major physical exertion for 24 hours (because of the sedation medicines used during the test).   SYMPTOMS TO REPORT IMMEDIATELY: A gastroenterologist can be reached at any hour. Please call [phone]  for any of the following symptoms:  Following lower endoscopy (colonoscopy, flexible sigmoidoscopy) Excessive amounts of blood  in the stool  Significant tenderness, worsening of abdominal pains  Swelling of the abdomen that is new, acute  Fever of 100 or higher  Following upper endoscopy (EGD, EUS, ERCP, esophageal dilation) Vomiting of blood or coffee ground material  New, significant abdominal pain  New, significant chest pain or pain under the shoulder blades  Painful or persistently difficult swallowing  New shortness of breath  Black, tarry-looking or red, bloody stools  FOLLOW UP:  If any biopsies were taken you will be contacted by phone or by letter within the next 1-3 weeks. Call [phone]  if you have not heard about the biopsies in 3 weeks.  Please also call with any specific questions about appointments or follow up tests.

## 2016-03-18 NOTE — Transfer of Care (Signed)
Immediate Anesthesia Transfer of Care Note  Patient: [name] [name]  Procedure(s) Performed: Procedure(s): COLONOSCOPY WITH PROPOFOL (N/A)  Patient Location: PACU  Anesthesia Type:MAC  Level of Consciousness: sedated and patient cooperative  Airway & Oxygen Therapy: Patient Spontanous Breathing and Patient connected to face mask oxygen  Post-op Assessment: Report given to RN, Post -op Vital signs reviewed and stable and Patient moving all extremities X 4  Post vital signs: stable  Last Vitals:  Vitals:   03/18/16 0938  BP: 118/65  Pulse: 62  Resp: 15  Temp: 37.1 C    Last Pain:  Vitals:   03/18/16 0938  TempSrc: Oral         Complications: No apparent anesthesia complications

## 2016-03-18 NOTE — H&P (Signed)
[facility] History and Physical   Primary Care Physician:  [name], MD   Reason for Procedure:  Hx colon polyps  Plan:    Colonoscopy The risks and benefits as well as alternatives of endoscopic procedure(s) have been discussed and reviewed. All questions answered. The patient agrees to proceed.      HPI: [name] [name] is a 65 y.o. female w/ hx colonic polyps here for surveillance colonoscopy   Past Medical History:  Diagnosis Date  . Abnormal LFTs 2013  . Allergic rhinosinusitis   . Anemia of chronic disease   . At risk for dental problems   . Atypical nevus   . Bacterial overgrowth syndrome   . 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 Carotid stenosis    Carotid US (9/15):  R 40-59%; L 1-39% >> FU 1 year  . Congestive heart failure (CHF) (HCC)    EF 25-30%  . Diabetes mellitus    resolved after weight loss  . Fracture of left clavicle   . History of blood transfusion 2013   anemia  . Hx of cardiovascular stress test    Myoview (9/15):  inf-apical scar; no ischemia; EF 47% - low risk   . 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 Osteoporosis   . Pancytopenia 10/07/2011  . Pathologic fracture of neck of femur (HCC)   . Personal history of colonic polyps   . Renal insufficiency    hx of yrs ago  . Serratia marcescens infection - bactermia assoc w/ PICC 01/18/2015  . 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HCC)   . Splenomegaly    By ultrasound  . Thrombophilia (HCC)   . Vitamin D deficiency     Past Surgical History:  Procedure Laterality Date  . APPENDECTOMY  yrs ago  . CHOLECYSTECTOMY  yrs ago  . COLONOSCOPY  12/05/2005   internal hemorrhoids (for polyp surveillance)  . COLONOSCOPY  04/26/2012   Procedure: COLONOSCOPY;  Surgeon: Iva Boop, MD;  Location: [facility];  Service: Endoscopy;  Laterality: N/A;  . ESOPHAGOGASTRODUODENOSCOPY  01/22/2009   erosive esophagitis  . ORIF PROXIMAL  FEMORAL FRACTURE W/ ITST NAIL SYSTEM  03/2007   left, Dr. Simonne Come  . SMALL INTESTINE SURGERY  2005   multiple with right colon resection for ischemia/infarct    Prior to Admission medications   Medication Sig Start Date End Date Taking? Authorizing Provider  ADULT TPN Home TPN from [facility]: 1800 ml bag, 4 nights week for 8 hours  (includes 1 hour taper up and down).  Monday, Wednesday, Thursday and Fridays.   Yes Historical Provider, MD  aspirin EC 81 MG tablet Take 1 tablet (81 mg total) by mouth daily. 11/17/13  Yes Scott T Alben Spittle, PA-C  carvedilol (COREG) 3.125 MG tablet Take 1 tablet (3.125 mg total) by mouth 2 (two) times daily. 07/23/15  Yes Wendall Stade, MD  Cholecalciferol (VITAMIN D-3 PO) Take 1 tablet by mouth daily.    Yes Historical Provider, MD  ciprofloxacin (CIPRO) 500 MG tablet Take 1 tablet by mouth twice daily 12/18/15  Yes Iva Boop, MD  losartan (COZAAR) 25 MG tablet Take 1 tablet (25 mg total) by mouth daily. 07/23/15  Yes Wendall Stade, MD  Multiple Vitamin (MULTIVITAMIN WITH MINERALS) TABS Take 1 tablet by mouth daily.   Yes Historical Provider, MD  omeprazole (PRILOSEC) 40 MG capsule TAKE 1 CAPSULE BY MOUTH TWICE DAILY AT 10  AM AND AT 5 PM 03/11/16  Yes Gatha Mayer, MD  ondansetron (ZOFRAN-ODT) 8 MG disintegrating tablet Take 1 tablet (8 mg total) by mouth every 8 (eight) hours as needed for nausea or vomiting (and diarrhea). 09/17/15  Yes Gatha Mayer, MD  Teduglutide, rDNA, (GATTEX) 5 MG KIT Inject 3.3 Units into the skin daily. Patient taking differently: Inject 3.1 mLs into the skin daily.  09/11/14  Yes Gatha Mayer, MD  VITAMIN E PO Take 1 tablet by mouth daily.    Yes Historical Provider, MD  diphenhydrAMINE (BENADRYL) 25 mg capsule Take 25 mg by mouth every 6 (six) hours as needed. For itching/sleep.    Historical Provider, MD  diphenoxylate-atropine (LOMOTIL) 2.5-0.025 MG tablet Take 1 tablet by mouth 4 (four) times daily as needed for diarrhea or loose  stools. 12/12/15   Gatha Mayer, MD  Elastic Bandages & Supports (ABDOMINAL BINDER/ELASTIC MED) MISC Bring to colonoscopy appointment 12/18/15   Gatha Mayer, MD  loperamide (IMODIUM) 2 MG capsule Take 4 mg by mouth daily as needed for diarrhea or loose stools.    Historical Provider, MD    Current Facility-Administered Medications  Medication Dose Route Frequency Provider Last Rate Last Dose  . 0.9 %  sodium chloride infusion   Intravenous Continuous Gatha Mayer, MD      . lactated ringers infusion   Intravenous Continuous Gatha Mayer, MD        Allergies as of 01/17/2016 - Review Complete 09/17/2015  Allergen Reaction Noted  . Iohexol Itching 11/14/2010  . Penicillins Itching and Swelling 01/19/2008    Family History  Problem Relation Age of Onset  . Diabetes Mother   . Hypertension Mother   . Colon cancer Neg Hx   . Stomach cancer Neg Hx     Social History   Social History  . Marital status: Married   Social History Main Topics  . Smoking status: Former Smoker    Types: Cigarettes    Quit date: 05/12/2004  . Smokeless tobacco: Never Used  . Alcohol use No  . Drug use: No  . Sexual activity: Not Currently    Review of Systems: Positive for intermittent diarrhea All other review of systems negative except as mentioned in the HPI.  Physical Exam: Vital signs in last 24 hours: Temp:  [98.8 F (37.1 C)] 98.8 F (37.1 C) (11/07 0938) Pulse Rate:  [62] 62 (11/07 0938) Resp:  [15] 15 (11/07 0938) BP: (118)/(65) 118/65 (11/07 0938) SpO2:  [100 %] 100 % (11/07 0938) Weight:  [138 lb (62.6 kg)] 138 lb (62.6 kg) (11/07 2130)   General:   Alert,  Well-developed, well-nourished, pleasant and cooperative in NAD Lungs:  Clear throughout to auscultation.   Heart:  Regular rate and rhythm; no murmurs, clicks, rubs,  or gallops. Abdomen:  Soft, nontender and nondistended. Normal bowel sounds.   Neuro/Psych:  Alert and cooperative. Normal mood and affect. A and O x  3   '@Meighan Treto'$  Simonne Maffucci, MD, [facility] [phone] (pager) 03/18/2016 9:54 AM@

## 2016-03-19 ENCOUNTER — Encounter (HOSPITAL_COMMUNITY): Payer: Self-pay | Admitting: Internal Medicine

## 2016-03-24 ENCOUNTER — Other Ambulatory Visit (INDEPENDENT_AMBULATORY_CARE_PROVIDER_SITE_OTHER): Payer: Medicare Other

## 2016-03-24 ENCOUNTER — Telehealth: Payer: Self-pay | Admitting: Internal Medicine

## 2016-03-24 DIAGNOSIS — K912 Postsurgical malabsorption, not elsewhere classified: Secondary | ICD-10-CM

## 2016-03-24 DIAGNOSIS — Z79899 Other long term (current) drug therapy: Secondary | ICD-10-CM | POA: Diagnosis not present

## 2016-03-24 LAB — COMPREHENSIVE METABOLIC PANEL
ALBUMIN: 3.5 g/dL (ref 3.5–5.2)
ALK PHOS: 73 U/L (ref 39–117)
ALT: 16 U/L (ref 0–35)
AST: 16 U/L (ref 0–37)
BUN: 16 mg/dL (ref 6–23)
CHLORIDE: 110 meq/L (ref 96–112)
CO2: 23 mEq/L (ref 19–32)
Calcium: 8.9 mg/dL (ref 8.4–10.5)
Creatinine, Ser: 1.06 mg/dL (ref 0.40–1.20)
GFR: 55.14 mL/min — AB (ref >60.00)
GLUCOSE: 97 mg/dL (ref 70–99)
POTASSIUM: 3.7 meq/L (ref 3.5–5.1)
Sodium: 141 mEq/L (ref 135–145)
TOTAL PROTEIN: 7.9 g/dL (ref 6.0–8.3)
Total Bilirubin: 0.4 mg/dL (ref 0.2–1.2)

## 2016-03-24 LAB — CBC WITH DIFFERENTIAL/PLATELET
BASOS PCT: 0.5 % (ref 0.0–3.0)
Basophils Absolute: 0 10*3/uL (ref 0.0–0.1)
EOS PCT: 4.7 % (ref 0.0–5.0)
Eosinophils Absolute: 0.2 10*3/uL (ref 0.0–0.7)
HCT: 32.5 % — ABNORMAL LOW (ref 36.0–46.0)
HEMOGLOBIN: 10.7 g/dL — AB (ref 12.0–15.0)
Lymphocytes Relative: 40.7 % (ref 12.0–46.0)
Lymphs Abs: 2.1 10*3/uL (ref 0.7–4.0)
MCHC: 33 g/dL (ref 30.0–36.0)
MCV: 93.1 fl (ref 78.0–100.0)
MONOS PCT: 9.1 % (ref 3.0–12.0)
Monocytes Absolute: 0.5 10*3/uL (ref 0.1–1.0)
Neutro Abs: 2.3 10*3/uL (ref 1.4–7.7)
Neutrophils Relative %: 45 % (ref 43.0–77.0)
Platelets: 145 10*3/uL — ABNORMAL LOW (ref 150.0–400.0)
RBC: 3.49 Mil/uL — AB (ref 3.87–5.11)
RDW: 14.6 % (ref 11.5–15.5)
WBC: 5.1 10*3/uL (ref 4.0–10.5)

## 2016-03-24 LAB — C-REACTIVE PROTEIN: CRP: 0.2 mg/dL — ABNORMAL LOW (ref 0.5–20.0)

## 2016-03-24 LAB — TRIGLYCERIDES: Triglycerides: 182 mg/dL — ABNORMAL HIGH (ref 0.0–149.0)

## 2016-03-24 LAB — BILIRUBIN, DIRECT: Bilirubin, Direct: 0.1 mg/dL (ref 0.0–0.3)

## 2016-03-24 LAB — MAGNESIUM: Magnesium: 1.8 mg/dL (ref 1.5–2.5)

## 2016-03-24 LAB — PHOSPHORUS: Phosphorus: 4.1 mg/dL (ref 2.3–4.6)

## 2016-03-24 MED ORDER — DIPHENOXYLATE-ATROPINE 2.5-0.025 MG PO TABS: 1.0000 | ORAL_TABLET | Freq: Four times a day (QID) | ORAL | 5 refills | Status: DC | PRN

## 2016-03-24 NOTE — Telephone Encounter (Signed)
Lomotil refilled as requested, faxed to [phone].

## 2016-03-24 NOTE — Telephone Encounter (Signed)
Labs sent again.

## 2016-03-24 NOTE — Telephone Encounter (Signed)
6 

## 2016-03-24 NOTE — Telephone Encounter (Signed)
How many refills may I send in Sir, thank you.

## 2016-03-25 LAB — PREALBUMIN: Prealbumin: 20 mg/dL (ref 17–34)

## 2016-03-25 NOTE — Progress Notes (Signed)
stable labs Send to pharmacist unless they can see in epic?

## 2016-04-11 ENCOUNTER — Other Ambulatory Visit: Payer: Self-pay | Admitting: Internal Medicine

## 2016-04-11 NOTE — Telephone Encounter (Signed)
OK to refill each x 1 year Omeprazole should be before breakfast and supper

## 2016-04-11 NOTE — Telephone Encounter (Signed)
Please advise as to # of refills Sir, thank you.

## 2016-04-14 ENCOUNTER — Other Ambulatory Visit (INDEPENDENT_AMBULATORY_CARE_PROVIDER_SITE_OTHER): Payer: Medicare Other

## 2016-04-14 ENCOUNTER — Other Ambulatory Visit: Payer: Self-pay | Admitting: Internal Medicine

## 2016-04-14 DIAGNOSIS — K6389 Other specified diseases of intestine: Secondary | ICD-10-CM

## 2016-04-14 DIAGNOSIS — K912 Postsurgical malabsorption, not elsewhere classified: Secondary | ICD-10-CM | POA: Diagnosis not present

## 2016-04-14 DIAGNOSIS — K90829 Short bowel syndrome, unspecified: Secondary | ICD-10-CM

## 2016-04-14 LAB — CBC WITH DIFFERENTIAL/PLATELET
BASOS PCT: 0.5 % (ref 0.0–3.0)
Basophils Absolute: 0 10*3/uL (ref 0.0–0.1)
EOS PCT: 4.2 % (ref 0.0–5.0)
Eosinophils Absolute: 0.2 10*3/uL (ref 0.0–0.7)
HCT: 31.6 % — ABNORMAL LOW (ref 36.0–46.0)
HEMOGLOBIN: 10.4 g/dL — AB (ref 12.0–15.0)
LYMPHS ABS: 2.1 10*3/uL (ref 0.7–4.0)
Lymphocytes Relative: 42.9 % (ref 12.0–46.0)
MCHC: 33 g/dL (ref 30.0–36.0)
MCV: 92.9 fl (ref 78.0–100.0)
MONO ABS: 0.6 10*3/uL (ref 0.1–1.0)
MONOS PCT: 12 % (ref 3.0–12.0)
Neutro Abs: 1.9 10*3/uL (ref 1.4–7.7)
Neutrophils Relative %: 40.4 % — ABNORMAL LOW (ref 43.0–77.0)
Platelets: 112 10*3/uL — ABNORMAL LOW (ref 150.0–400.0)
RBC: 3.4 Mil/uL — ABNORMAL LOW (ref 3.87–5.11)
RDW: 14.8 % (ref 11.5–15.5)
WBC: 4.8 10*3/uL (ref 4.0–10.5)

## 2016-04-14 LAB — COMPREHENSIVE METABOLIC PANEL
ALBUMIN: 3.4 g/dL — AB (ref 3.5–5.2)
ALT: 13 U/L (ref 0–35)
AST: 14 U/L (ref 0–37)
Alkaline Phosphatase: 69 U/L (ref 39–117)
BUN: 19 mg/dL (ref 6–23)
CHLORIDE: 110 meq/L (ref 96–112)
CO2: 21 mEq/L (ref 19–32)
Calcium: 9.1 mg/dL (ref 8.4–10.5)
Creatinine, Ser: 1.03 mg/dL (ref 0.40–1.20)
GFR: 56.99 mL/min — AB (ref >60.00)
Glucose, Bld: 90 mg/dL (ref 70–99)
POTASSIUM: 3.5 meq/L (ref 3.5–5.1)
SODIUM: 139 meq/L (ref 135–145)
Total Bilirubin: 0.5 mg/dL (ref 0.2–1.2)
Total Protein: 7.9 g/dL (ref 6.0–8.3)

## 2016-04-14 LAB — BILIRUBIN, DIRECT: BILIRUBIN DIRECT: 0.1 mg/dL (ref 0.0–0.3)

## 2016-04-14 LAB — MAGNESIUM: MAGNESIUM: 1.7 mg/dL (ref 1.5–2.5)

## 2016-04-14 LAB — C-REACTIVE PROTEIN: CRP: 0.2 mg/dL — AB (ref 0.5–20.0)

## 2016-04-14 LAB — TRIGLYCERIDES: TRIGLYCERIDES: 165 mg/dL — AB (ref 0.0–149.0)

## 2016-04-15 LAB — PREALBUMIN: PREALBUMIN: 18 mg/dL (ref 17–34)

## 2016-04-16 NOTE — Progress Notes (Signed)
Labs stable mild abnormal or normal Cc the TPN pharmacist

## 2016-04-30 ENCOUNTER — Other Ambulatory Visit (INDEPENDENT_AMBULATORY_CARE_PROVIDER_SITE_OTHER): Payer: Medicare Other

## 2016-04-30 DIAGNOSIS — K912 Postsurgical malabsorption, not elsewhere classified: Secondary | ICD-10-CM | POA: Diagnosis not present

## 2016-04-30 DIAGNOSIS — Z79899 Other long term (current) drug therapy: Secondary | ICD-10-CM | POA: Diagnosis not present

## 2016-04-30 DIAGNOSIS — K6389 Other specified diseases of intestine: Secondary | ICD-10-CM

## 2016-04-30 LAB — COMPREHENSIVE METABOLIC PANEL
ALBUMIN: 3.3 g/dL — AB (ref 3.5–5.2)
ALK PHOS: 62 U/L (ref 39–117)
ALT: 12 U/L (ref 0–35)
AST: 13 U/L (ref 0–37)
BUN: 23 mg/dL (ref 6–23)
CALCIUM: 8.6 mg/dL (ref 8.4–10.5)
CO2: 22 mEq/L (ref 19–32)
Chloride: 109 mEq/L (ref 96–112)
Creatinine, Ser: 1.05 mg/dL (ref 0.40–1.20)
GFR: 55.73 mL/min — AB (ref >60.00)
Glucose, Bld: 85 mg/dL (ref 70–99)
POTASSIUM: 3.4 meq/L — AB (ref 3.5–5.1)
Sodium: 139 mEq/L (ref 135–145)
TOTAL PROTEIN: 7.4 g/dL (ref 6.0–8.3)
Total Bilirubin: 0.4 mg/dL (ref 0.2–1.2)

## 2016-04-30 LAB — CBC WITH DIFFERENTIAL/PLATELET
BASOS PCT: 0.7 % (ref 0.0–3.0)
Basophils Absolute: 0 10*3/uL (ref 0.0–0.1)
EOS PCT: 4.9 % (ref 0.0–5.0)
Eosinophils Absolute: 0.2 10*3/uL (ref 0.0–0.7)
HEMATOCRIT: 30.6 % — AB (ref 36.0–46.0)
HEMOGLOBIN: 10.2 g/dL — AB (ref 12.0–15.0)
Lymphocytes Relative: 44.9 % (ref 12.0–46.0)
Lymphs Abs: 2.2 10*3/uL (ref 0.7–4.0)
MCHC: 33.2 g/dL (ref 30.0–36.0)
MCV: 92.4 fl (ref 78.0–100.0)
MONOS PCT: 9.3 % (ref 3.0–12.0)
Monocytes Absolute: 0.5 10*3/uL (ref 0.1–1.0)
Neutro Abs: 2 10*3/uL (ref 1.4–7.7)
Neutrophils Relative %: 40.2 % — ABNORMAL LOW (ref 43.0–77.0)
Platelets: 129 10*3/uL — ABNORMAL LOW (ref 150.0–400.0)
RBC: 3.32 Mil/uL — AB (ref 3.87–5.11)
RDW: 14.9 % (ref 11.5–15.5)
WBC: 4.9 10*3/uL (ref 4.0–10.5)

## 2016-04-30 LAB — BILIRUBIN, DIRECT: BILIRUBIN DIRECT: 0.1 mg/dL (ref 0.0–0.3)

## 2016-04-30 LAB — C-REACTIVE PROTEIN: CRP: 0.2 mg/dL — AB (ref 0.5–20.0)

## 2016-04-30 LAB — MAGNESIUM: MAGNESIUM: 1.8 mg/dL (ref 1.5–2.5)

## 2016-04-30 LAB — TRIGLYCERIDES: TRIGLYCERIDES: 153 mg/dL — AB (ref 0.0–149.0)

## 2016-05-01 LAB — PREALBUMIN: Prealbumin: 16 mg/dL — ABNORMAL LOW (ref 17–34)

## 2016-05-06 ENCOUNTER — Telehealth: Payer: Self-pay | Admitting: Internal Medicine

## 2016-05-06 NOTE — Progress Notes (Signed)
Labs normal or stable mild abnormalities  Cc pharmacist please

## 2016-05-06 NOTE — Telephone Encounter (Signed)
Labs faxed to the requested number

## 2016-05-08 ENCOUNTER — Encounter: Payer: Self-pay | Admitting: Internal Medicine

## 2016-05-08 DIAGNOSIS — E441 Mild protein-calorie malnutrition: Secondary | ICD-10-CM | POA: Insufficient documentation

## 2016-05-19 NOTE — Progress Notes (Signed)
Patient ID: [name] [name], female   DOB: 1950/09/10, 66 y.o.   MRN: [id number]   Cardiology Office Note    Date:  05/22/2016   ID:  [name] [name], DOB November 10, 1950, MRN [id number]  PCP:  Annye Asa, MD  Cardiologist:  Dr. Jenkins Rouge      History of Present Illness: [name] [name] is a 66 y.o. 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   She is followed here by Dr. Carlean Purl and at [facility] with Dr. Clementeen Graham (has been placed on Gattex).  Echo 5/15  Improved EF Study Conclusions  - Left ventricle: Inferior and apical hypokinesis. The cavity size was mildly dilated. Wall thickness was normal. The estimated ejection fraction was 45%. - Aortic valve: Valve area (VTI): 2.28 cm^2. Valve area (Vmax): 2.4 cm^2. - Mitral valve: There was mild regurgitation. - Atrial septum: No defect or patent foramen ovale was identified.  ------------------------------------------------------------------- Labs, prior tests, procedures, and surgery: Echocardiography (August 2013).   EF was 30%.  Myovue 9/15 with old inferior apical scar no ischemia  EF  47%   No further w/u deemed necessary by PA at time    Her imdur and coreg had been stopped  The former not thought to be doing any good and latter as she had first degree block and resting HR in 60's   She feels great TPN 3x/week and iv fluid 1x/week Has been off anticoagulation for 3 years and it is not clear to me why   Recent admission for  ecoli bacteremia 05/2015  Antibiotics via PIC line  Which was replaced  Carotid duplex 07/19/15 40-59% RICA stenosis   Echo 07/19/2015 showed drop in EF again Study Conclusions  - Left ventricle: The cavity  size was mildly dilated. Systolic  function was severely reduced. The estimated ejection fraction  was in the range of 25% to 30%. Diffuse hypokinesis. Dyskinesis  and scarring of the mid-apicalanteroseptal and apical myocardium;  consistent with infarction in the distribution of the left  anterior descending coronary artery. Dyskinesis of the  mid-apicalinferior myocardium. Hypokinesis of the anterior  myocardium. Doppler parameters are consistent with abnormal left  ventricular relaxation (grade 1 diastolic dysfunction). No  evidence of thrombus. - Ventricular septum: Septal motion showed abnormal function,  dyssynergy, and paradox. These changes are consistent with  intraventricular conduction delay. - Aortic valve: There was mild stenosis. - Mitral valve: Calcified annulus. Mildly thickened leaflets .  There was mild to moderate regurgitation directed centrally. - Left atrium: The atrium was moderately dilated.  Started on ARB and coreg:  Right knee with some pain for a month   Recent Labs: 09/10/2015: Pro B Natriuretic peptide (BNP) 523.0 04/30/2016: ALT 12; Creatinine, Ser 1.05; Hemoglobin 10.2; Potassium 3.4  Wt Readings from Last 3 Encounters:  05/22/16 134 lb 12.8 oz (61.1 kg)  03/18/16 138 lb (62.6 kg)  03/04/16 138 lb (62.6 kg)     Past Medical History:  Diagnosis Date  . Abnormal LFTs 2013  . Allergic rhinosinusitis   . Anemia of chronic disease   . At risk for dental problems   . Atypical nevus   . Bacterial overgrowth syndrome   . Brachial vein thrombus, left ([place]) 10/08/2012  . Carotid stenosis  Carotid US (9/15):  R 40-59%; L 1-39% >> FU 1 year  . Congestive heart failure (CHF) (HCC)    EF 25-30%  . Diabetes mellitus    resolved after weight loss  . Fracture of left clavicle   . History of blood transfusion 2013   anemia  . Hx of cardiovascular stress test    Myoview (9/15):  inf-apical scar; no ischemia; EF 47% - low risk   . Infection  by Candida species 12/12/2011  . Osteoporosis   . Pancytopenia 10/07/2011  . Pathologic fracture of neck of femur ([place])   . Personal history of colonic polyps   . Renal insufficiency    hx of yrs ago  . Serratia marcescens infection - bactermia assoc w/ PICC 01/18/2015  . 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[place])   . Splenomegaly    By ultrasound  . Thrombophilia ([place])   . Vitamin D deficiency     Current Outpatient Prescriptions  Medication Sig Dispense Refill  . ADULT TPN Home TPN from [facility]: 1800 ml bag, 4 nights week for 8 hours  (includes 1 hour taper up and down).  Monday, Wednesday, Thursday and Fridays.    Marland Kitchen aspirin EC 81 MG tablet Take 1 tablet (81 mg total) by mouth daily.    . carvedilol (COREG) 3.125 MG tablet Take 1 tablet (3.125 mg total) by mouth 2 (two) times daily. 180 tablet 3  . Cholecalciferol (VITAMIN D-3 PO) Take 1 tablet by mouth daily.     . diphenhydrAMINE (BENADRYL) 25 mg capsule Take 25 mg by mouth every 6 (six) hours as needed. For itching/sleep.    . diphenoxylate-atropine (LOMOTIL) 2.5-0.025 MG tablet Take 1 tablet by mouth 4 (four) times daily as needed for diarrhea or loose stools. 90 tablet 5  . Elastic Bandages & Supports (ABDOMINAL BINDER/ELASTIC MED) MISC Bring to colonoscopy appointment 1 each 0  . loperamide (IMODIUM) 2 MG capsule Take 4 mg by mouth daily as needed for diarrhea or loose stools.    Marland Kitchen losartan (COZAAR) 25 MG tablet Take 1 tablet (25 mg total) by mouth daily. 90 tablet 3  . Multiple Vitamin (MULTIVITAMIN WITH MINERALS) TABS Take 1 tablet by mouth daily.    Marland Kitchen omeprazole (PRILOSEC) 40 MG capsule Take 1 capsule (40 mg total) by mouth 2 (two) times daily. Take before breakfast and supper 60 capsule 11  . ondansetron (ZOFRAN-ODT) 8 MG disintegrating tablet DISSOLVE 1 TABLET BY MOUTH EVERY 8 HOURS AS NEEDED FOR NAUSEA OR VOMITING(AND DIARRHEA) 30-40 MINUTES PRIOR TO IV INFUSION 30 tablet 11  .  Teduglutide, rDNA, (GATTEX) 5 MG KIT Inject 3.3 Units into the skin daily. (Patient taking differently: Inject 3.1 mLs into the skin daily. ) 1 kit 11  . VITAMIN E PO Take 1 tablet by mouth daily.      No current facility-administered medications for this visit.     Allergies:   Iohexol and Penicillins   Social History:  The patient  reports that she quit smoking about 12 years ago. Her smoking use included Cigarettes. She has never used smokeless tobacco. She reports that she does not drink alcohol or use drugs.   Family History:  The patient's family history includes Diabetes in her mother; Hypertension in her mother.   ROS:  Please see the history of present illness.   She has chronic diarrhea.   All other systems reviewed and negative.   PHYSICAL EXAM: VS:  BP (!) 80/50   Pulse 63   Ht '5\' 8"'$  (1.727 m)   Wt 134 lb 12.8 oz (61.1 kg)   LMP 02/26/2014   BMI 20.50 kg/m  Well nourished, well developed, in no acute distress HEENT: normal Neck: no JVD Cardiac:  normal S1, S2; RRR; no murmur  PIC line under left clavicle  Lungs:  clear to auscultation bilaterally, no wheezing, rhonchi or rales Abd: soft, nontender, no hepatomegaly Ext: no edema Skin: warm and dry Neuro:  CNs 2-12 intact, no focal abnormalities noted Left tunneled subclavian line   EKG:   11/17/13   NSR, HR 64, normal axis, IVCD, poor R wave progression, anterolateral T wave inversions, first degree block with a PR interval of 222    06/15/15  SR rate 67  LBBB PR 212  05/22/16  SR rate 63 LBBB   Assessment and Plan:  CHF: Functional class one does not want heart cath. No symptoms active walking tolerating ARB and beta blocker BNP Much better 500 range  Continue medical Rx  Short Bowel Syndrome stable getting TPN every 4 days new PIC line looks good  Bacteremia resolved off antibiotics no further chills  Carotid f40-59% RICA stenosis f.u duplex 07/2016   Othro: right knee pain NSAI's f/u Dr Theda Sers who sees her  husband   Jenkins Rouge

## 2016-05-22 ENCOUNTER — Ambulatory Visit (INDEPENDENT_AMBULATORY_CARE_PROVIDER_SITE_OTHER): Payer: Medicare Other | Admitting: Cardiovascular Disease

## 2016-05-22 ENCOUNTER — Encounter: Payer: Self-pay | Admitting: Cardiovascular Disease

## 2016-05-22 VITALS — BP 80/50 | HR 63 | Ht 68.0 in | Wt 134.8 lb

## 2016-05-22 DIAGNOSIS — I5022 Chronic systolic (congestive) heart failure: Secondary | ICD-10-CM | POA: Diagnosis not present

## 2016-05-22 DIAGNOSIS — I429 Cardiomyopathy, unspecified: Secondary | ICD-10-CM | POA: Diagnosis not present

## 2016-05-22 NOTE — Patient Instructions (Addendum)
Medication Instructions:  Your physician recommends that you continue on your current medications as directed. Please refer to the Current Medication list given to you today.  Labwork: NONE  Testing/Procedures: Your physician has requested that you have an echocardiogram in 6 months on same day as office visit. Echocardiography is a painless test that uses sound waves to create images of your heart. It provides your doctor with information about the size and shape of your heart and how well your heart's chambers and valves are working. This procedure takes approximately one hour. There are no restrictions for this procedure.  Follow-Up: Your physician wants you to follow-up in: 6 months with Dr. Nishan. You will receive a reminder letter in the mail two months in advance. If you don't receive a letter, please call our office to schedule the follow-up appointment.   If you need a refill on your cardiac medications before your next appointment, please call your pharmacy.    

## 2016-05-26 ENCOUNTER — Other Ambulatory Visit (INDEPENDENT_AMBULATORY_CARE_PROVIDER_SITE_OTHER): Payer: Medicare Other

## 2016-05-26 DIAGNOSIS — K6389 Other specified diseases of intestine: Secondary | ICD-10-CM

## 2016-05-26 DIAGNOSIS — K912 Postsurgical malabsorption, not elsewhere classified: Secondary | ICD-10-CM | POA: Diagnosis not present

## 2016-05-26 LAB — COMPREHENSIVE METABOLIC PANEL
ALT: 20 U/L (ref 0–35)
AST: 21 U/L (ref 0–37)
Albumin: 3.1 g/dL — ABNORMAL LOW (ref 3.5–5.2)
Alkaline Phosphatase: 60 U/L (ref 39–117)
BILIRUBIN TOTAL: 0.4 mg/dL (ref 0.2–1.2)
BUN: 24 mg/dL — ABNORMAL HIGH (ref 6–23)
CALCIUM: 8.8 mg/dL (ref 8.4–10.5)
CO2: 23 meq/L (ref 19–32)
CREATININE: 1.05 mg/dL (ref 0.40–1.20)
Chloride: 111 mEq/L (ref 96–112)
GFR: 55.72 mL/min — AB (ref >60.00)
GLUCOSE: 82 mg/dL (ref 70–99)
Potassium: 3.6 mEq/L (ref 3.5–5.1)
Sodium: 140 mEq/L (ref 135–145)
Total Protein: 7.3 g/dL (ref 6.0–8.3)

## 2016-05-26 LAB — CBC WITH DIFFERENTIAL/PLATELET
BASOS ABS: 0 10*3/uL (ref 0.0–0.1)
Basophils Relative: 0.7 % (ref 0.0–3.0)
EOS ABS: 0.2 10*3/uL (ref 0.0–0.7)
Eosinophils Relative: 3.1 % (ref 0.0–5.0)
HEMATOCRIT: 30.2 % — AB (ref 36.0–46.0)
Hemoglobin: 10.1 g/dL — ABNORMAL LOW (ref 12.0–15.0)
LYMPHS PCT: 43.1 % (ref 12.0–46.0)
Lymphs Abs: 2.7 10*3/uL (ref 0.7–4.0)
MCHC: 33.4 g/dL (ref 30.0–36.0)
MCV: 91.4 fl (ref 78.0–100.0)
MONOS PCT: 10.3 % (ref 3.0–12.0)
Monocytes Absolute: 0.6 10*3/uL (ref 0.1–1.0)
NEUTROS ABS: 2.7 10*3/uL (ref 1.4–7.7)
Neutrophils Relative %: 42.8 % — ABNORMAL LOW (ref 43.0–77.0)
PLATELETS: 172 10*3/uL (ref 150.0–400.0)
RBC: 3.3 Mil/uL — AB (ref 3.87–5.11)
RDW: 14.7 % (ref 11.5–15.5)
WBC: 6.3 10*3/uL (ref 4.0–10.5)

## 2016-05-26 LAB — MAGNESIUM: Magnesium: 1.8 mg/dL (ref 1.5–2.5)

## 2016-05-26 LAB — BILIRUBIN, DIRECT: Bilirubin, Direct: 0.1 mg/dL (ref 0.0–0.3)

## 2016-05-26 LAB — TRIGLYCERIDES: Triglycerides: 144 mg/dL (ref 0.0–149.0)

## 2016-05-26 LAB — C-REACTIVE PROTEIN: CRP: 0.5 mg/dL (ref 0.5–20.0)

## 2016-05-27 LAB — PREALBUMIN: Prealbumin: 15 mg/dL — ABNORMAL LOW (ref 17–34)

## 2016-05-27 NOTE — Progress Notes (Signed)
Labs stable Send to pharmacist

## 2016-05-28 ENCOUNTER — Ambulatory Visit: Payer: Medicare Other | Admitting: Family Medicine

## 2016-06-02 ENCOUNTER — Ambulatory Visit (INDEPENDENT_AMBULATORY_CARE_PROVIDER_SITE_OTHER): Payer: Medicare Other | Admitting: Family Medicine

## 2016-06-02 ENCOUNTER — Ambulatory Visit (HOSPITAL_BASED_OUTPATIENT_CLINIC_OR_DEPARTMENT_OTHER)
Admission: RE | Admit: 2016-06-02 | Discharge: 2016-06-02 | Disposition: A | Discharge status: Home / Self Care | Payer: Medicare Other | Source: Ambulatory Visit | Attending: Family Medicine | Admitting: Family Medicine

## 2016-06-02 ENCOUNTER — Other Ambulatory Visit: Payer: Self-pay | Admitting: Family Medicine

## 2016-06-02 ENCOUNTER — Encounter: Payer: Self-pay | Admitting: Family Medicine

## 2016-06-02 VITALS — BP 106/83 | HR 70 | Temp 98.0°F | Resp 16 | Ht 68.0 in | Wt 131.5 lb

## 2016-06-02 DIAGNOSIS — M7121 Synovial cyst of popliteal space [Baker], right knee: Secondary | ICD-10-CM

## 2016-06-02 DIAGNOSIS — R600 Localized edema: Secondary | ICD-10-CM | POA: Diagnosis not present

## 2016-06-02 DIAGNOSIS — M25561 Pain in right knee: Secondary | ICD-10-CM

## 2016-06-02 NOTE — Progress Notes (Signed)
Pre visit review using our clinic review tool, if applicable. No additional management support is needed unless otherwise documented below in the visit note. 

## 2016-06-02 NOTE — Progress Notes (Signed)
   Subjective:    Patient ID: [name] [name], female    DOB: [birth year], 66 y.o.   MRN: [id number]  HPI R knee pain- pain started prior to Christmas and pt felt is was 'just arthritis'.  Had 'some popping- but nothing that I couldn't live with'.  Since last week, pain is no longer anterior and is now located behind knee w/ swelling extending from knee down into foot.  No redness or warmth.  Has been using Voltaren gel w/ some improvement.  Also using ASA 500mg  prn.  Initially, had pain w/ walking but recently, pain improves w/ walking but swelling will worsen.  'the hardest thing to do is to get up and get going'.     Review of Systems For ROS see HPI     Objective:   Physical Exam  Constitutional: She is oriented to person, place, and time. She appears well-developed and well-nourished. No distress.  HENT:  Head: Normocephalic and atraumatic.  Cardiovascular: Intact distal pulses.   Musculoskeletal: She exhibits edema (non pitting edema of R lower leg) and tenderness (TTP over R posteromedial knee w/o obvious deformity).  Neurological: She is alert and oriented to person, place, and time.  Skin: Skin is warm and dry. No erythema.  Psychiatric: She has a normal mood and affect. Her behavior is normal. Thought content normal.  Vitals reviewed.         Assessment & Plan:  R posterior knee pain- new.  Pt initially thought her sxs were due to arthritis but pain has now changed to posterior and is associated w/ swelling.  This may be consistent w/ Baker's Cyst but need to r/o DVT.  Get stat venous doppler to assess.  If doppler negative, will refer to ortho.  Reviewed supportive care and red flags that should prompt return.  Pt expressed understanding and is in agreement w/ plan.

## 2016-06-02 NOTE — Patient Instructions (Signed)
We'll notify you of your ultrasound results and determine the next steps Continue the Voltaren gel for pain relief If the ultrasound is negative, we'll get you set up with ortho for complete evaluation Schedule your complete physical at your convenience Call with any questions or concerns Hang in there!!!

## 2016-06-23 ENCOUNTER — Encounter (INDEPENDENT_AMBULATORY_CARE_PROVIDER_SITE_OTHER): Payer: Self-pay | Admitting: Orthopaedic Surgery

## 2016-06-23 ENCOUNTER — Ambulatory Visit (INDEPENDENT_AMBULATORY_CARE_PROVIDER_SITE_OTHER): Payer: Medicare Other

## 2016-06-23 ENCOUNTER — Ambulatory Visit (INDEPENDENT_AMBULATORY_CARE_PROVIDER_SITE_OTHER): Payer: Medicare Other | Admitting: Orthopaedic Surgery

## 2016-06-23 VITALS — BP 101/56 | HR 59 | Resp 14 | Ht 68.0 in | Wt 134.0 lb

## 2016-06-23 DIAGNOSIS — M25561 Pain in right knee: Secondary | ICD-10-CM | POA: Diagnosis not present

## 2016-06-23 DIAGNOSIS — G8929 Other chronic pain: Secondary | ICD-10-CM

## 2016-06-23 MED ORDER — LIDOCAINE HCL 1 % IJ SOLN
5.0000 mL | INTRAMUSCULAR | Status: AC | PRN
  Administered 2016-06-23: 5 mL

## 2016-06-23 MED ORDER — BUPIVACAINE HCL 0.5 % IJ SOLN
3.0000 mL | INTRAMUSCULAR | Status: AC | PRN
  Administered 2016-06-23: 3 mL via INTRA_ARTICULAR

## 2016-06-23 MED ORDER — METHYLPREDNISOLONE ACETATE 40 MG/ML IJ SUSP
80.0000 mg | INTRAMUSCULAR | Status: AC | PRN
  Administered 2016-06-23: 80 mg

## 2016-06-23 NOTE — Progress Notes (Signed)
Office Visit Note   Patient: [name] [name]           Date of Birth: 04/05/1951           MRN: [id number] Visit Date: 06/23/2016              Requested by: [name], MD [address] [place], [place] [address] PCP: Annye Asa, MD   Assessment & Plan: Visit Diagnoses: End-stage osteoarthritis right knee with increased valgus. Long discussion regarding different treatment options. [name] [name] would like to have her knee aspirated with a cortisone injection. We have also discussed other options including Visco supplementation and even knee replacement.   Follow-Up Instructions: No Follow-up on file.   Orders:  No orders of the defined types were placed in this encounter.  No orders of the defined types were placed in this encounter.     Procedures: Large Joint Inj Date/Time: 06/23/2016 11:04 AM Performed by: Garald Balding Authorized by: Garald Balding   Consent Given by:  Patient Timeout: prior to procedure the correct patient, procedure, and site was verified   Indications:  Pain and joint swelling Location:  Knee Site:  R knee Prep: patient was prepped and draped in usual sterile fashion   Needle Size:  25 G Needle Length:  1.5 inches Approach:  Anteromedial Ultrasound Guidance: No   Fluoroscopic Guidance: No   Arthrogram: No   Medications:  5 mL lidocaine 1 %; 80 mg methylPREDNISolone acetate 40 MG/ML; 3 mL bupivacaine 0.5 % Aspiration Attempted: No   Patient tolerance:  Patient tolerated the procedure well with no immediate complications  Aspiration was performed with approximately 30 mL of cloudy greenish yellow fluid. I will send this for cell, cell count, crystals, culture and sensitivity.   Clinical Data: No additional findings.   Subjective: Chief Complaint  Patient presents with  . Right Knee - Cyst    [name] [name] presents today with Right knee pain, possible Baker's Cyst. PCP sent [name] [name] to [facility] for ultrasound, results printed. [name] [name]  relates this pain  and edema have been ongoing since Christmas but has become increasingly worse with the edema, pain that radiates from Right knee to ankle.   [name] [name] relates she has a pic line for TPN for approx. 10 years, due to a blood clot.  [name] [name] does have compromise of her activities related to the pain and swelling in her right knee. A recent ultrasound study  was consistent with a large Baker's cyst.  Review of Systems   Objective: Vital Signs: LMP 02/26/2014   Physical Exam  Ortho Exam right knee acute effusion. The knee was minimally warm. Increased valgus with weightbearing. No  pain medially but considerable pain laterally. No instability. Palpable Baker's cyst without calf pain. No distal edema. Neurovascular exam intact. Pain with range of motion of either hip. Straight leg raise negative.  Specialty Comments:  No specialty comments available.  Imaging: No results found.   PMFS History: Patient Active Problem List   Diagnosis Date Noted  . Mild malnutrition ([place]) 05/08/2016  . Gram-negative bacteremia 05/16/2015  . Bacteremia associated with intravascular line ([place]) 01/17/2015  . Thrombocytopenia ([place]) 08/17/2014  . 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 Splenomegaly   . Hx of adenomatous colonic polyps 04/18/2012  . Chronic systolic heart failure ([place]) 12/25/2011  . Eustachian tube dysfunction 11/17/2011  . Nutritional deficiency 06/04/2011  . Osteoporosis 03/14/2010  . Anemia of chronic disease 10/01/2009  . GERD (gastroesophageal  reflux disease) 11/19/2008  . ALKALINE PHOSPHATASE, ELEVATED 10/12/2008  . VITAMIN D DEFICIENCY 01/19/2008  . TRANSAMINASES, SERUM, ELEVATED 01/19/2008  . Bacterial overgrowth syndrome 11/19/2004  . Acquired short bowel syndrome 11/20/2003  . Thrombophilia ([place]) 11/20/2003   Past Medical History:  Diagnosis Date  . Abnormal LFTs 2013  . Allergic rhinosinusitis   . Anemia of chronic disease   . At risk for dental problems     . Atypical nevus   . Bacterial overgrowth syndrome   . 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 Carotid stenosis    Carotid US (9/15):  R 40-59%; L 1-39% >> FU 1 year  . Congestive heart failure (CHF) (HCC)    EF 25-30%  . Diabetes mellitus    resolved after weight loss  . Fracture of left clavicle   . History of blood transfusion 2013   anemia  . Hx of cardiovascular stress test    Myoview (9/15):  inf-apical scar; no ischemia; EF 47% - low risk   . 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 Osteoporosis   . Pancytopenia 10/07/2011  . Pathologic fracture of neck of femur ([place])   . Personal history of colonic polyps   . Renal insufficiency    hx of yrs ago  . Serratia marcescens infection - bactermia assoc w/ PICC 01/18/2015  . 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[place])   . Splenomegaly    By ultrasound  . Thrombophilia ([place])   . Vitamin D deficiency     Family History  Problem Relation Age of Onset  . Diabetes Mother   . Hypertension Mother   . Colon cancer Neg Hx   . Stomach cancer Neg Hx     Past Surgical History:  Procedure Laterality Date  . APPENDECTOMY  yrs ago  . CHOLECYSTECTOMY  yrs ago  . COLONOSCOPY  12/05/2005   internal hemorrhoids (for polyp surveillance)  . COLONOSCOPY  04/26/2012   Procedure: COLONOSCOPY;  Surgeon: Gatha Mayer, MD;  Location: [facility];  Service: Endoscopy;  Laterality: N/A;  . COLONOSCOPY WITH PROPOFOL N/A 03/18/2016   Procedure: COLONOSCOPY WITH PROPOFOL;  Surgeon: Gatha Mayer, MD;  Location: [facility];  Service: Endoscopy;  Laterality: N/A;  . ESOPHAGOGASTRODUODENOSCOPY  01/22/2009   erosive esophagitis  . ORIF PROXIMAL FEMORAL FRACTURE W/ ITST NAIL SYSTEM  03/2007   left, Dr. Shellia Carwin  . SMALL INTESTINE SURGERY  2005   multiple with right colon resection for ischemia/infarct   Social History   Occupational History  . Not on file.   Social History Main Topics  . Smoking  status: Former Smoker    Types: Cigarettes    Quit date: 05/12/2004  . Smokeless tobacco: Never Used  . Alcohol use No  . Drug use: No  . Sexual activity: Not Currently

## 2016-06-24 LAB — SYNOVIAL CELL COUNT + DIFF, W/ CRYSTALS
BASOPHILS, %: 0 %
EOSINOPHILS-SYNOVIAL: 0 % (ref 0–2)
Lymphocytes-Synovial Fld: 4 % (ref 0–74)
MONOCYTE/MACROPHAGE: 3 % (ref 0–69)
Neutrophil, Synovial: 93 % — ABNORMAL HIGH (ref 0–24)
Synoviocytes, %: 0 % (ref 0–15)
WBC, Synovial: 49335 cells/uL — ABNORMAL HIGH (ref <150)

## 2016-06-26 ENCOUNTER — Telehealth: Payer: Self-pay | Admitting: Internal Medicine

## 2016-06-26 NOTE — Telephone Encounter (Signed)
FYI , we will keep an eye out for these papers.

## 2016-06-27 ENCOUNTER — Telehealth: Payer: Self-pay | Admitting: Internal Medicine

## 2016-06-27 NOTE — Telephone Encounter (Signed)
Prior authorization has been approved .  Reference # [id number] from 05/10/16-05/11/17.  [facility] notified

## 2016-06-27 NOTE — Telephone Encounter (Signed)
Has been completed

## 2016-06-28 LAB — BODY FLUID CULTURE
Gram Stain: NONE SEEN
ORGANISM ID, BACTERIA: NO GROWTH

## 2016-06-30 ENCOUNTER — Encounter: Payer: Self-pay | Admitting: Family Medicine

## 2016-06-30 ENCOUNTER — Telehealth: Payer: Self-pay | Admitting: Internal Medicine

## 2016-06-30 ENCOUNTER — Ambulatory Visit (INDEPENDENT_AMBULATORY_CARE_PROVIDER_SITE_OTHER): Payer: Medicare Other | Admitting: Family Medicine

## 2016-06-30 VITALS — BP 110/78 | HR 76 | Temp 98.3°F | Resp 16 | Ht 68.0 in | Wt 135.1 lb

## 2016-06-30 DIAGNOSIS — J0100 Acute maxillary sinusitis, unspecified: Secondary | ICD-10-CM | POA: Diagnosis not present

## 2016-06-30 DIAGNOSIS — R61 Generalized hyperhidrosis: Secondary | ICD-10-CM | POA: Diagnosis not present

## 2016-06-30 LAB — POCT INFLUENZA A: Rapid Influenza A Ag: NEGATIVE

## 2016-06-30 MED ORDER — DOXYCYCLINE HYCLATE 100 MG PO TABS: 100.0000 mg | ORAL_TABLET | Freq: Two times a day (BID) | ORAL | 0 refills | Status: DC

## 2016-06-30 NOTE — Progress Notes (Signed)
   Subjective:    Patient ID: [name] [name], female    DOB: [birth year], 66 y.o.   MRN: [id number]  HPI Night sweats- pt had a cold the end of January that was improving but recently worsened.  This weekend had 2 nights of 'really bad chills'.  Pt has hx of similar w/ PICC line infxn.  Now having chest congestion, cough- intermittently productive.  + sinus congestion.  + sick contacts.   Review of Systems For ROS see HPI     Objective:   Physical Exam  Constitutional: She is oriented to person, place, and time. She appears well-developed and well-nourished. No distress.  HENT:  Head: Normocephalic and atraumatic.  Right Ear: Tympanic membrane normal.  Left Ear: Tympanic membrane normal.  Nose: Mucosal edema and rhinorrhea present. Right sinus exhibits maxillary sinus tenderness and frontal sinus tenderness. Left sinus exhibits maxillary sinus tenderness and frontal sinus tenderness.  Mouth/Throat: Uvula is midline and mucous membranes are normal. Posterior oropharyngeal erythema present. No oropharyngeal exudate.  Eyes: Conjunctivae and EOM are normal. Pupils are equal, round, and reactive to light.  Neck: Normal range of motion. Neck supple.  Cardiovascular: Normal rate, regular rhythm and normal heart sounds.   Pulmonary/Chest: Effort normal and breath sounds normal. No respiratory distress. She has no wheezes.  Lymphadenopathy:    She has no cervical adenopathy.  Neurological: She is alert and oriented to person, place, and time.  Skin: Skin is warm and dry. No erythema (no signs of infxn at L chest PICC line site).          Assessment & Plan:  Night sweats- negative for flu.  Pt's sxs consistent w/ sinus infxn.  Start abx.  Reviewed supportive care and red flags that should prompt return.  Pt expressed understanding and is in agreement w/ plan.

## 2016-06-30 NOTE — Progress Notes (Signed)
Pre visit review using our clinic review tool, if applicable. No additional management support is needed unless otherwise documented below in the visit note. 

## 2016-06-30 NOTE — Telephone Encounter (Signed)
Gattex was approved last week. I left a message for [facility] to call back

## 2016-06-30 NOTE — Patient Instructions (Signed)
Follow up as needed/scheduled The flu test is negative- thank goodness! Start the Doxycycline twice daily for the sinus infection Call with any questions or concerns Hang in there!!!

## 2016-07-01 ENCOUNTER — Telehealth: Payer: Self-pay | Admitting: Hematology and Oncology

## 2016-07-01 NOTE — Telephone Encounter (Signed)
Needs to cancel her appointment on Friday 2/23 " has been sick and is still getting over some yucky stuff and will call back when I'm feeling better to reschedule"

## 2016-07-03 ENCOUNTER — Telehealth: Payer: Self-pay | Admitting: Internal Medicine

## 2016-07-03 DIAGNOSIS — R509 Fever, unspecified: Secondary | ICD-10-CM

## 2016-07-03 NOTE — Telephone Encounter (Signed)
Patient with fever and chills.  ? Sinus infection was treated with doxycycline on Monday by [name], but has not stopped the fever and chills she has with infusions.  I have arranged for her to have a PICC exchange for 07/04/16 11:00.  She is advised to arrive at [facility] at 10:45 in radiology.

## 2016-07-03 NOTE — Telephone Encounter (Signed)
Per Dr. Carlean Purl patient needs to have blood cultures x 2 one from the line, one peripheral and culture the tip as well.

## 2016-07-04 ENCOUNTER — Other Ambulatory Visit: Payer: Self-pay | Admitting: Internal Medicine

## 2016-07-04 ENCOUNTER — Other Ambulatory Visit: Payer: Self-pay | Admitting: Radiology

## 2016-07-04 ENCOUNTER — Ambulatory Visit: Payer: Medicare Other | Admitting: Hematology and Oncology

## 2016-07-04 ENCOUNTER — Encounter (HOSPITAL_COMMUNITY): Payer: Self-pay | Admitting: Radiology

## 2016-07-04 ENCOUNTER — Ambulatory Visit (HOSPITAL_COMMUNITY)
Admission: RE | Admit: 2016-07-04 | Discharge: 2016-07-04 | Disposition: A | Discharge status: Home / Self Care | Payer: Medicare Other | Source: Ambulatory Visit | Attending: Internal Medicine | Admitting: Internal Medicine

## 2016-07-04 DIAGNOSIS — Z452 Encounter for adjustment and management of vascular access device: Secondary | ICD-10-CM

## 2016-07-04 DIAGNOSIS — R509 Fever, unspecified: Secondary | ICD-10-CM

## 2016-07-04 DIAGNOSIS — K912 Postsurgical malabsorption, not elsewhere classified: Secondary | ICD-10-CM

## 2016-07-04 HISTORY — PX: IR GENERIC HISTORICAL: IMG1180011

## 2016-07-04 MED ORDER — LIDOCAINE HCL 1 % IJ SOLN
INTRAMUSCULAR | Status: AC
  Filled 2016-07-04: qty 20

## 2016-07-04 MED ORDER — LIDOCAINE HCL 1 % IJ SOLN
INTRAMUSCULAR | Status: DC | PRN
  Administered 2016-07-04: 5 mL

## 2016-07-04 NOTE — Progress Notes (Signed)
Pt with hx of short gut syndrome on chronic TPN 4x/week via tunneled (L)IJ PICC line She has developed some night time sweats, possible fevers and there is concern for possible central line infection/bacteremia. She is referred for removal of existing PICC and placement of new one. Also for blood cx check. Ideally would like to remove existing PICC line and give her a 'central line holiday' She could return in a few days, and if cultures negative, proceed with placement of a new tunneled PICC. She reports she has not tried to aspirate from the line in a long time. Plan reviewed with ordering MD, who agrees.  Existing PICC removed, was able to obtain 0.37mL blood upon removal. Otherwise, would not aspirate. Tip also sent for culture. See separate procedure note.  Pt will return Mon 2/26 for placement of new tunneled IJ PICC  Ascencion Dike PA-C Interventional Radiology 07/04/2016 11:23 AM

## 2016-07-05 LAB — BLOOD CULTURE ID PANEL (REFLEXED)
Acinetobacter baumannii: NOT DETECTED
CANDIDA GLABRATA: NOT DETECTED
CANDIDA KRUSEI: NOT DETECTED
CANDIDA PARAPSILOSIS: NOT DETECTED
CARBAPENEM RESISTANCE: NOT DETECTED
Candida albicans: NOT DETECTED
Candida tropicalis: NOT DETECTED
ENTEROBACTER CLOACAE COMPLEX: NOT DETECTED
ENTEROBACTERIACEAE SPECIES: DETECTED — AB
ENTEROCOCCUS SPECIES: NOT DETECTED
Escherichia coli: NOT DETECTED
Haemophilus influenzae: NOT DETECTED
Klebsiella oxytoca: NOT DETECTED
Klebsiella pneumoniae: DETECTED — AB
Listeria monocytogenes: NOT DETECTED
Methicillin resistance: DETECTED — AB
Neisseria meningitidis: NOT DETECTED
Proteus species: NOT DETECTED
Pseudomonas aeruginosa: NOT DETECTED
SERRATIA MARCESCENS: NOT DETECTED
STAPHYLOCOCCUS SPECIES: DETECTED — AB
STREPTOCOCCUS AGALACTIAE: NOT DETECTED
Staphylococcus aureus (BCID): NOT DETECTED
Streptococcus pneumoniae: NOT DETECTED
Streptococcus pyogenes: NOT DETECTED
Streptococcus species: NOT DETECTED

## 2016-07-07 ENCOUNTER — Ambulatory Visit (HOSPITAL_COMMUNITY)
Admission: RE | Admit: 2016-07-07 | Discharge: 2016-07-07 | Disposition: A | Discharge status: Home / Self Care | Payer: Medicare Other | Source: Ambulatory Visit | Attending: Internal Medicine | Admitting: Internal Medicine

## 2016-07-07 ENCOUNTER — Encounter (HOSPITAL_COMMUNITY): Payer: Self-pay

## 2016-07-07 ENCOUNTER — Inpatient Hospital Stay (HOSPITAL_COMMUNITY)
Admission: RE | Admit: 2016-07-07 | Discharge: 2016-07-10 | DRG: 315 | Disposition: A | Discharge status: Home Health | Payer: Medicare Other | Source: Ambulatory Visit | Attending: Internal Medicine | Admitting: Internal Medicine

## 2016-07-07 DIAGNOSIS — Z87891 Personal history of nicotine dependence: Secondary | ICD-10-CM | POA: Diagnosis not present

## 2016-07-07 DIAGNOSIS — Y712 Prosthetic and other implants, materials and accessory cardiovascular devices associated with adverse incidents: Secondary | ICD-10-CM | POA: Diagnosis not present

## 2016-07-07 DIAGNOSIS — Z8719 Personal history of other diseases of the digestive system: Secondary | ICD-10-CM

## 2016-07-07 DIAGNOSIS — Z789 Other specified health status: Secondary | ICD-10-CM

## 2016-07-07 DIAGNOSIS — T827XXA Infection and inflammatory reaction due to other cardiac and vascular devices, implants and grafts, initial encounter: Secondary | ICD-10-CM

## 2016-07-07 DIAGNOSIS — Z91041 Radiographic dye allergy status: Secondary | ICD-10-CM | POA: Diagnosis not present

## 2016-07-07 DIAGNOSIS — Z66 Do not resuscitate: Secondary | ICD-10-CM | POA: Diagnosis present

## 2016-07-07 DIAGNOSIS — L89312 Pressure ulcer of right buttock, stage 2: Secondary | ICD-10-CM | POA: Diagnosis present

## 2016-07-07 DIAGNOSIS — K219 Gastro-esophageal reflux disease without esophagitis: Secondary | ICD-10-CM | POA: Diagnosis present

## 2016-07-07 DIAGNOSIS — T80211D Bloodstream infection due to central venous catheter, subsequent encounter: Secondary | ICD-10-CM | POA: Diagnosis not present

## 2016-07-07 DIAGNOSIS — T827XXD Infection and inflammatory reaction due to other cardiac and vascular devices, implants and grafts, subsequent encounter: Secondary | ICD-10-CM | POA: Diagnosis not present

## 2016-07-07 DIAGNOSIS — Z79899 Other long term (current) drug therapy: Secondary | ICD-10-CM

## 2016-07-07 DIAGNOSIS — Z833 Family history of diabetes mellitus: Secondary | ICD-10-CM | POA: Diagnosis not present

## 2016-07-07 DIAGNOSIS — E860 Dehydration: Secondary | ICD-10-CM | POA: Diagnosis present

## 2016-07-07 DIAGNOSIS — I5022 Chronic systolic (congestive) heart failure: Secondary | ICD-10-CM

## 2016-07-07 DIAGNOSIS — L8990 Pressure ulcer of unspecified site, unspecified stage: Secondary | ICD-10-CM | POA: Insufficient documentation

## 2016-07-07 DIAGNOSIS — T80211A Bloodstream infection due to central venous catheter, initial encounter: Principal | ICD-10-CM | POA: Diagnosis present

## 2016-07-07 DIAGNOSIS — B9689 Other specified bacterial agents as the cause of diseases classified elsewhere: Secondary | ICD-10-CM | POA: Diagnosis present

## 2016-07-07 DIAGNOSIS — Y848 Other medical procedures as the cause of abnormal reaction of the patient, or of later complication, without mention of misadventure at the time of the procedure: Secondary | ICD-10-CM | POA: Diagnosis present

## 2016-07-07 DIAGNOSIS — B957 Other staphylococcus as the cause of diseases classified elsewhere: Secondary | ICD-10-CM | POA: Diagnosis present

## 2016-07-07 DIAGNOSIS — Z452 Encounter for adjustment and management of vascular access device: Secondary | ICD-10-CM | POA: Diagnosis not present

## 2016-07-07 DIAGNOSIS — D509 Iron deficiency anemia, unspecified: Secondary | ICD-10-CM | POA: Diagnosis present

## 2016-07-07 DIAGNOSIS — R509 Fever, unspecified: Secondary | ICD-10-CM

## 2016-07-07 DIAGNOSIS — Z880 Allergy status to penicillin: Secondary | ICD-10-CM | POA: Diagnosis not present

## 2016-07-07 DIAGNOSIS — B961 Klebsiella pneumoniae [K. pneumoniae] as the cause of diseases classified elsewhere: Secondary | ICD-10-CM | POA: Diagnosis not present

## 2016-07-07 DIAGNOSIS — R7881 Bacteremia: Secondary | ICD-10-CM | POA: Diagnosis not present

## 2016-07-07 DIAGNOSIS — Z8249 Family history of ischemic heart disease and other diseases of the circulatory system: Secondary | ICD-10-CM | POA: Diagnosis not present

## 2016-07-07 DIAGNOSIS — M1990 Unspecified osteoarthritis, unspecified site: Secondary | ICD-10-CM

## 2016-07-07 DIAGNOSIS — K912 Postsurgical malabsorption, not elsewhere classified: Secondary | ICD-10-CM | POA: Diagnosis present

## 2016-07-07 DIAGNOSIS — D638 Anemia in other chronic diseases classified elsewhere: Secondary | ICD-10-CM | POA: Diagnosis not present

## 2016-07-07 DIAGNOSIS — Z8619 Personal history of other infectious and parasitic diseases: Secondary | ICD-10-CM | POA: Diagnosis not present

## 2016-07-07 LAB — CBC
HCT: 27.3 % — ABNORMAL LOW (ref 36.0–46.0)
Hemoglobin: 8.9 g/dL — ABNORMAL LOW (ref 12.0–15.0)
MCH: 30 pg (ref 26.0–34.0)
MCHC: 32.6 g/dL (ref 30.0–36.0)
MCV: 91.9 fL (ref 78.0–100.0)
PLATELETS: 156 10*3/uL (ref 150–400)
RBC: 2.97 MIL/uL — AB (ref 3.87–5.11)
RDW: 15.4 % (ref 11.5–15.5)
WBC: 6.6 10*3/uL (ref 4.0–10.5)

## 2016-07-07 LAB — BASIC METABOLIC PANEL
ANION GAP: 3 — AB (ref 5–15)
BUN: 11 mg/dL (ref 6–20)
CHLORIDE: 119 mmol/L — AB (ref 101–111)
CO2: 20 mmol/L — AB (ref 22–32)
Calcium: 8.2 mg/dL — ABNORMAL LOW (ref 8.9–10.3)
Creatinine, Ser: 1 mg/dL (ref 0.44–1.00)
GFR calc non Af Amer: 57 mL/min — ABNORMAL LOW (ref >60)
Glucose, Bld: 80 mg/dL (ref 65–99)
POTASSIUM: 3.1 mmol/L — AB (ref 3.5–5.1)
SODIUM: 142 mmol/L (ref 135–145)

## 2016-07-07 LAB — CATH TIP CULTURE
Culture: NO GROWTH
Special Requests: NORMAL

## 2016-07-07 LAB — PROTIME-INR
INR: 1.18
PROTHROMBIN TIME: 15.1 s (ref 11.4–15.2)

## 2016-07-07 MED ORDER — VANCOMYCIN HCL IN DEXTROSE 1-5 GM/200ML-% IV SOLN
1000.0000 mg | Freq: Once | INTRAVENOUS | Status: DC
  Filled 2016-07-07: qty 200

## 2016-07-07 MED ORDER — ASPIRIN EC 81 MG PO TBEC
81.0000 mg | DELAYED_RELEASE_TABLET | Freq: Every day | ORAL | Status: DC
  Administered 2016-07-07 – 2016-07-10 (×4): 81 mg via ORAL
  Filled 2016-07-07 (×4): qty 1

## 2016-07-07 MED ORDER — ONDANSETRON HCL 4 MG/2ML IJ SOLN: 4.0000 mg | Freq: Four times a day (QID) | INTRAMUSCULAR | Status: DC | PRN

## 2016-07-07 MED ORDER — CEFAZOLIN SODIUM-DEXTROSE 2-4 GM/100ML-% IV SOLN
2.0000 g | Freq: Three times a day (TID) | INTRAVENOUS | Status: DC
  Administered 2016-07-07 – 2016-07-10 (×9): 2 g via INTRAVENOUS
  Filled 2016-07-07 (×9): qty 100

## 2016-07-07 MED ORDER — ENOXAPARIN SODIUM 40 MG/0.4ML ~~LOC~~ SOLN
40.0000 mg | SUBCUTANEOUS | Status: DC
  Filled 2016-07-07 (×2): qty 0.4

## 2016-07-07 MED ORDER — ACETAMINOPHEN 325 MG PO TABS
650.0000 mg | ORAL_TABLET | Freq: Four times a day (QID) | ORAL | Status: DC | PRN
  Administered 2016-07-07 – 2016-07-09 (×5): 650 mg via ORAL
  Filled 2016-07-07 (×6): qty 2

## 2016-07-07 MED ORDER — SODIUM CHLORIDE 0.9 % IV SOLN
500.0000 mg | Freq: Two times a day (BID) | INTRAVENOUS | Status: DC
  Administered 2016-07-07 – 2016-07-08 (×2): 500 mg via INTRAVENOUS
  Filled 2016-07-07 (×3): qty 500

## 2016-07-07 MED ORDER — ONDANSETRON HCL 4 MG PO TABS: 4.0000 mg | ORAL_TABLET | Freq: Four times a day (QID) | ORAL | Status: DC | PRN

## 2016-07-07 MED ORDER — PANTOPRAZOLE SODIUM 40 MG PO TBEC
40.0000 mg | DELAYED_RELEASE_TABLET | Freq: Every day | ORAL | Status: DC
  Administered 2016-07-07 – 2016-07-10 (×4): 40 mg via ORAL
  Filled 2016-07-07 (×4): qty 1

## 2016-07-07 MED ORDER — ADULT MULTIVITAMIN W/MINERALS CH
1.0000 | ORAL_TABLET | Freq: Every day | ORAL | Status: DC
  Administered 2016-07-07 – 2016-07-10 (×4): 1 via ORAL
  Filled 2016-07-07 (×4): qty 1

## 2016-07-07 MED ORDER — SODIUM CHLORIDE 0.9 % IV SOLN
INTRAVENOUS | Status: DC
  Administered 2016-07-07: 09:00:00 via INTRAVENOUS

## 2016-07-07 MED ORDER — LOPERAMIDE HCL 2 MG PO CAPS: 4.0000 mg | ORAL_CAPSULE | Freq: Every day | ORAL | Status: DC | PRN

## 2016-07-07 MED ORDER — TEDUGLUTIDE (RDNA) 5 MG ~~LOC~~ KIT
3.1000 mg | PACK | Freq: Every day | SUBCUTANEOUS | Status: DC
  Administered 2016-07-07 – 2016-07-10 (×4): 3.1 mg via SUBCUTANEOUS

## 2016-07-07 MED ORDER — ACETAMINOPHEN 650 MG RE SUPP: 650.0000 mg | Freq: Four times a day (QID) | RECTAL | Status: DC | PRN

## 2016-07-07 MED ORDER — POTASSIUM CHLORIDE CRYS ER 20 MEQ PO TBCR
40.0000 meq | EXTENDED_RELEASE_TABLET | Freq: Once | ORAL | Status: AC
  Administered 2016-07-07: 40 meq via ORAL
  Filled 2016-07-07: qty 2

## 2016-07-07 MED ORDER — CARVEDILOL 3.125 MG PO TABS
3.1250 mg | ORAL_TABLET | Freq: Two times a day (BID) | ORAL | Status: DC
  Administered 2016-07-07 – 2016-07-08 (×3): 3.125 mg via ORAL
  Filled 2016-07-07 (×3): qty 1

## 2016-07-07 MED ORDER — LOSARTAN POTASSIUM 50 MG PO TABS
25.0000 mg | ORAL_TABLET | Freq: Every day | ORAL | Status: DC
Start: 2016-07-08 — End: 2016-07-10
  Administered 2016-07-08 – 2016-07-10 (×2): 25 mg via ORAL
  Filled 2016-07-07 (×3): qty 1

## 2016-07-07 NOTE — Progress Notes (Signed)
Pt left in care of [place].  Assisted pt to bed, pt has all belongings.  Nurse at bedside obtaining vital signs.

## 2016-07-07 NOTE — Progress Notes (Signed)
Pharmacy Antibiotic Note  [name] [name] is a 66 y.o. female with hx short gut syndrome on chronicTPN with old PICC removed on 2/23 d/t bacteremia.  She was scheduled for IJ cath placement  on 2/26 but radiologist recom to postpone placement and admit patient for treatment of Bacteremia.  Patient reported she has been off TPN since 07/02/16 (was on TPN 4 days a week- [facility]; gets from [facility]) and was only on doxycycline 100 mg bid for sinusitis (for 6 day txment and had 3 days of abx PTA). To start vancomycin and ancef for bacteremia. Of note, patient has PCN but tolerated ceftriaxone and ceftazidime in the past  Microbiology results:  2/23 BCx x3: 1/3 GPC, 2/3 GNR klebsiella pneum (R= ampi, I= unsayn); BCID staph species - methicillin resistance; klebsiella 2/23 cath tip: NG FINAL   Plan: - vancomycin 500 mg IV q12h - ancef 2 gm  IV q8h  _____________________________  Height: 5\' 8"  (172.7 cm) Weight: 137 lb 9.6 oz (62.4 kg) IBW/kg (Calculated) : 63.9  Temp (24hrs), Avg:98.1 F (36.7 C), Min:97.9 F (36.6 C), Max:98.3 F (36.8 C)   Recent Labs Lab 07/07/16 0851  WBC 6.6    CrCl cannot be calculated (Patient's most recent lab result is older than the maximum 21 days allowed.).    Allergies  Allergen Reactions  . Iohexol Itching    IVP Dye ok if taken with benadryl  . Penicillins Itching and Swelling    Has patient had a PCN reaction causing immediate rash, facial/tongue/throat swelling, SOB or lightheadedness with hypotension: yes Has patient had a PCN reaction causing severe rash involving mucus membranes or skin necrosis: unknown Has patient had a PCN reaction that required hospitalization : no, dr came to the house Has patient had a PCN reaction occurring within the last 10 years: no If all of the above answers are "NO", then may proceed with Cephalosporin use.     Thank you for allowing pharmacy to be a part of this patient's care.  Lynelle Doctor 07/07/2016 3:15 PM

## 2016-07-07 NOTE — Progress Notes (Signed)
Report called to [place].  Will take pt up to 5th floor via wheelchair.  Pt's procedure was cancelled today and she will be admitted for iv antibiotics.  Pt received no sedation and never actually went down to IR.

## 2016-07-07 NOTE — H&P (Signed)
History and Physical  [name] [name] [id number] DOB: 02/11/1951 DOA: 07/07/2016  Referring physician: Interventional radiology/Cynthia Graylon Good, infectious disease  PCP: Annye Asa, MD  Outpatient Specialists: None Patient coming from: Home & is able to ambulate without assistance  Chief Complaint: Line infection   HPI: [name] [name] is a 66 y.o. female with medical history significant  for systolic CHF, diabetes mellitus treated with diet & short gut syndrome requiring PICC line for TPN for nutrition although she is able to eat on her own as well. She's had previous issues where the PICC line has become infected requiring IV antibiotics. Patient had her most recent PICC line removed on Friday with cultures sent. she came back today to have a new PICC line placed in interventional radiology when her PIC line and blood cultures were positive for Klebsiella as well as gram-positive cocci. This was discussed with infectious disease who recommended patient come in for IV antibiotics through a peripheral line and repeating her blood cultures until those were clear. Hospitalists were called for further evaluation.  Review of Systems: Patient seen after arrival to floor . Pt With no complaints  Pt denies any headaches, vision changes, dysphagia, chest pain, palpitations, short of breath, wheeze, cough, abdominal pain, hematuria, dysuria, constipation, diarrhea, focal extremity numbness weakness or pain .  Review of systems are otherwise negative   Past Medical History:  Diagnosis Date  . Abnormal LFTs 2013  . Allergic rhinosinusitis   . Anemia of chronic disease   . At risk for dental problems   . Atypical nevus   . Bacterial overgrowth syndrome   . 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 Carotid stenosis    Carotid US (9/15):  R 40-59%; L 1-39% >> FU 1 year  . Congestive heart failure (CHF) (HCC)    EF 25-30%  . Diabetes mellitus    resolved after weight loss  . Fracture of  left clavicle   . History of blood transfusion 2013   anemia  . Hx of cardiovascular stress test    Myoview (9/15):  inf-apical scar; no ischemia; EF 47% - low risk   . 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 Osteoporosis   . Pancytopenia 10/07/2011  . Pathologic fracture of neck of femur ([place])   . Personal history of colonic polyps   . Renal insufficiency    hx of yrs ago  . Serratia marcescens infection - bactermia assoc w/ PICC 01/18/2015  . 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 Splenomegaly    By ultrasound  . Thrombophilia ([place])   . Vitamin D deficiency    Past Surgical History:  Procedure Laterality Date  . APPENDECTOMY  yrs ago  . CHOLECYSTECTOMY  yrs ago  . COLONOSCOPY  12/05/2005   internal hemorrhoids (for polyp surveillance)  . COLONOSCOPY  04/26/2012   Procedure: COLONOSCOPY;  Surgeon: Gatha Mayer, MD;  [facility];  Service: Endoscopy;  Laterality: N/A;  . COLONOSCOPY WITH PROPOFOL N/A 03/18/2016   Procedure: COLONOSCOPY WITH PROPOFOL;  Surgeon: Gatha Mayer, MD;  [facility];  Service: Endoscopy;  Laterality: N/A;  . ESOPHAGOGASTRODUODENOSCOPY  01/22/2009   erosive esophagitis  . IR GENERIC HISTORICAL  07/04/2016   IR REMOVAL TUN CV CATH W/O FL 07/04/2016 Ascencion Dike, PA-C [facility]-INTERV RAD  . ORIF PROXIMAL FEMORAL FRACTURE W/ ITST NAIL SYSTEM  03/2007   left, Dr. Shellia Carwin  . SMALL INTESTINE SURGERY  2005   multiple with right colon resection for ischemia/infarct    Social History:  reports that she quit smoking about 12 years ago. Her smoking use included Cigarettes. She has never used smokeless tobacco. She reports that she does not drink alcohol or use drugs. Patient lives at home with her husband. She is normally able to ambulate without assistance    Allergies  Allergen Reactions  . Iohexol Itching and Other (See Comments)    Pt is able to use IVP dye if taken with Benadryl.    Marland Kitchen  Penicillins Itching, Swelling and Other (See Comments)    Reaction:  Facial swelling Has patient had a PCN reaction causing immediate rash, facial/tongue/throat swelling, SOB or lightheadedness with hypotension: Yes Has patient had a PCN reaction causing severe rash involving mucus membranes or skin necrosis: No Has patient had a PCN reaction that required hospitalization No Has patient had a PCN reaction occurring within the last 10 years: No If all of the above answers are "NO", then may proceed with Cephalosporin use.    Family History  Problem Relation Age of Onset  . Diabetes Mother   . Hypertension Mother   . Colon cancer Neg Hx   . Stomach cancer Neg Hx       Prior to Admission medications   Medication Sig Start Date End Date Taking? Authorizing Provider  ADULT TPN 1,800 mLs 4 (four) times a week. Pt receives home TPN from [facility]:  1800 mL bag, four nights weekly (Monday, Wednesday, Thursday, and Friday) for 8 hours (includes 1 hour taper up and down).   Yes Historical Provider, MD  aspirin EC 81 MG tablet Take 1 tablet (81 mg total) by mouth daily. 11/17/13  Yes Scott T Kathlen Mody, PA-C  carvedilol (COREG) 3.125 MG tablet Take 1 tablet (3.125 mg total) by mouth 2 (two) times daily. 07/23/15  Yes Josue Hector, MD  cholecalciferol (VITAMIN D) 1000 units tablet Take 1,000 Units by mouth daily.   Yes Historical Provider, MD  diphenhydrAMINE (BENADRYL) 25 mg capsule Take 25-50 mg by mouth every 6 (six) hours as needed for itching or sleep.    Yes Historical Provider, MD  diphenoxylate-atropine (LOMOTIL) 2.5-0.025 MG tablet Take 1 tablet by mouth 4 (four) times daily as needed for diarrhea or loose stools. 03/24/16  Yes Gatha Mayer, MD  ibuprofen (ADVIL,MOTRIN) 200 MG tablet Take 400 mg by mouth every 6 (six) hours as needed for headache, mild pain or moderate pain.   Yes Historical Provider, MD  losartan (COZAAR) 25 MG tablet Take 1 tablet (25 mg total) by mouth daily. 07/23/15  Yes  Josue Hector, MD  Multiple Vitamin (MULTIVITAMIN WITH MINERALS) TABS Take 1 tablet by mouth daily.   Yes Historical Provider, MD  omeprazole (PRILOSEC) 40 MG capsule Take 1 capsule (40 mg total) by mouth 2 (two) times daily. Take before breakfast and supper Patient taking differently: Take 40 mg by mouth daily before breakfast.  04/11/16  Yes Gatha Mayer, MD  ondansetron (ZOFRAN-ODT) 8 MG disintegrating tablet DISSOLVE 1 TABLET BY MOUTH EVERY 8 HOURS AS NEEDED FOR NAUSEA OR VOMITING(AND DIARRHEA) 30-40 MINUTES PRIOR TO IV INFUSION 04/11/16  Yes Gatha Mayer, MD  Teduglutide, rDNA, (GATTEX) 5 MG KIT Inject 3.3 Units into the skin daily. Patient taking differently: Inject 3.125 mLs into the skin daily.  09/11/14  Yes Gatha Mayer, MD  vitamin E 400 UNIT capsule Take 400 Units by mouth daily.    Yes  Historical Provider, MD    Physical Exam: BP 124/73 (BP Location: Right Arm)   Pulse (!) 47   Temp 98.3 F (36.8 C) (Oral)   Resp 16   Ht 5' 8" (1.727 m)   Wt 62.4 kg (137 lb 9.6 oz)   LMP 02/26/2014   SpO2 100%   BMI 20.92 kg/m   General:  Alert and oriented 3, no acute distress  Eyes: sclera nonicteric, extra ocular movements are intact  ENT: normocephalic, atraumatic, mucous membranes are moist  Neck: supple, no JVD  Cardiovascular: regular rate and rhythm, borderline bradycardia Respiratory:   Clear to auscultation bilaterally Abdomen: soft, nontender, nondistended, positive bowel sounds  Skin: No skin breaks, tears or lesions Musculoskeletal: no clubbing or cyanosis or edema  Psychiatric: patient is appropriate, no evidence of psychoses  Neurologic: no focal deficits           Labs on Admission:  Basic Metabolic Panel:  Recent Labs Lab 07/07/16 1538  NA 142  K 3.1*  CL 119*  CO2 20*  GLUCOSE 80  BUN 11  CREATININE 1.00  CALCIUM 8.2*   Liver Function Tests: No results for input(s): AST, ALT, ALKPHOS, BILITOT, PROT, ALBUMIN in the last 168 hours. No results for  input(s): LIPASE, AMYLASE in the last 168 hours. No results for input(s): AMMONIA in the last 168 hours. CBC:  Recent Labs Lab 07/07/16 0851  WBC 6.6  HGB 8.9*  HCT 27.3*  MCV 91.9  PLT 156   Cardiac Enzymes: No results for input(s): CKTOTAL, CKMB, CKMBINDEX, TROPONINI in the last 168 hours.  BNP (last 3 results) No results for input(s): BNP in the last 8760 hours.  ProBNP (last 3 results)  Recent Labs  09/10/15 0846  PROBNP 523.0*    CBG: No results for input(s): GLUCAP in the last 168 hours.  Radiological Exams on Admission: No results found.  EKG: Not done  Assessment/Plan Present on Admission: . Acquired short bowel syndrome requiring supplemental TPN: Patient can go several days without TPN. She should be okay and hopefully we can get a PICC line in her prog    . GERD (gastroesophageal reflux disease) . Anemia of chronic disease: Stable . Chronic systolic heart failure ([place]): Monitor input-output. Patient getting a significant amount antibiotics by IV and so we'll watch for any kind of volume overload  . Bacteremia associated with IV line, initial encounter [facility]): Have started Ancef and vancomycin as per ID recommendations. Repeat blood cultures the morning. Once these have been clear for several days, will place PICC line and then patient can go home with home health and IV antibiotics     Active Problems:   Anemia of chronic disease   Acquired short bowel syndrome   GERD (gastroesophageal reflux disease)   Chronic systolic heart failure ([place])   Bacteremia associated with intravascular line ([place])   Bacteremia associated with IV line, initial encounter ([place])   DVT prophylaxis: Lovenox  Code Status: DO NOT RESUSCITATE as confirmed by patient   Family Communication: Left message for husband   Disposition Plan: Patient will be here for several days to ensure clearance of blood cultures and placement of new PICC   Consults called: Discussed with  infectious disease   Admission status: Given that she will need to be in the hospital for several days requiring inpatient services, and admitted as inpatient     Annita Brod MD [facility] Pager [phone]  If 7PM-7AM, please contact night-coverage [email or web address] Password [email or web address]  07/07/2016, 4:58  PM

## 2016-07-07 NOTE — Progress Notes (Signed)
Patient ID: [name] [name], female   DOB: Dec 12, 1950, 66 y.o.   MRN: [id number] Pt presented to IR dept today for tunneled IJ cath placement for TPN secondary to short gut syndrome. Blood cx's obtained last week following IJ cath removal revealed klebsiella, MRSA and enterobacter. Case d/w Drs. Gessner/Henn/Snider. Decision made to postpone tunneled cath placement today and have [facility] admit for IV antbx therapy. Dr. Lyman Speller from [facility] notified and pt made aware of plans as well.

## 2016-07-08 DIAGNOSIS — K219 Gastro-esophageal reflux disease without esophagitis: Secondary | ICD-10-CM

## 2016-07-08 DIAGNOSIS — Z8619 Personal history of other infectious and parasitic diseases: Secondary | ICD-10-CM

## 2016-07-08 DIAGNOSIS — T827XXD Infection and inflammatory reaction due to other cardiac and vascular devices, implants and grafts, subsequent encounter: Secondary | ICD-10-CM

## 2016-07-08 DIAGNOSIS — B961 Klebsiella pneumoniae [K. pneumoniae] as the cause of diseases classified elsewhere: Secondary | ICD-10-CM

## 2016-07-08 DIAGNOSIS — Z880 Allergy status to penicillin: Secondary | ICD-10-CM

## 2016-07-08 DIAGNOSIS — K912 Postsurgical malabsorption, not elsewhere classified: Secondary | ICD-10-CM

## 2016-07-08 DIAGNOSIS — Z87891 Personal history of nicotine dependence: Secondary | ICD-10-CM

## 2016-07-08 DIAGNOSIS — Z91041 Radiographic dye allergy status: Secondary | ICD-10-CM

## 2016-07-08 DIAGNOSIS — Y712 Prosthetic and other implants, materials and accessory cardiovascular devices associated with adverse incidents: Secondary | ICD-10-CM

## 2016-07-08 DIAGNOSIS — M1990 Unspecified osteoarthritis, unspecified site: Secondary | ICD-10-CM

## 2016-07-08 DIAGNOSIS — T80211D Bloodstream infection due to central venous catheter, subsequent encounter: Secondary | ICD-10-CM

## 2016-07-08 LAB — CULTURE, BLOOD (ROUTINE X 2)

## 2016-07-08 LAB — POTASSIUM: POTASSIUM: 3.3 mmol/L — AB (ref 3.5–5.1)

## 2016-07-08 LAB — CREATININE, SERUM
CREATININE: 0.99 mg/dL (ref 0.44–1.00)
GFR calc Af Amer: >60 mL/min (ref >60)
GFR calc non Af Amer: 58 mL/min — ABNORMAL LOW (ref >60)

## 2016-07-08 MED ORDER — POTASSIUM CHLORIDE CRYS ER 20 MEQ PO TBCR
30.0000 meq | EXTENDED_RELEASE_TABLET | Freq: Once | ORAL | Status: AC
  Administered 2016-07-08: 30 meq via ORAL
  Filled 2016-07-08: qty 1

## 2016-07-08 NOTE — Progress Notes (Signed)
PROGRESS NOTE  [name] [name]  [id number] DOB: 02/26/1951 DOA: 07/07/2016 PCP: Annye Asa, MD  Outpatient Specialists: GI, Dr. Carlean Purl  Brief Narrative: [name] [name] is a 66 y.o. female with a history of systolic CHF, diet-controlled DM, and short gut syndrome (has ~1 ft small bowel following ischemic bowel 12 years ago) requiring PICC line for TPN who presents with bacteremia. She had experienced night sweats and chills consistent with prior PICC infections, so had PICC line removed 2/23 and blood cultures drawn. Upon returning 2/26 to have a new PICC line placed in interventional radiology, cultures were positive for Klebsiella as well as GPC w/methicillin resistance on rapid panel. This was discussed with infectious disease who recommended patient come in for IV antibiotics through a peripheral line and repeating her blood cultures until those were clear. Hospitalists were called for further evaluation. She has remained asymptomatic.   Assessment & Plan: Active Problems:   Anemia of chronic disease   Acquired short bowel syndrome   GERD (gastroesophageal reflux disease)   Chronic systolic heart failure (HCC)   Bacteremia associated with intravascular line ([place])   Bacteremia associated with IV line, initial encounter ([place])  Acquired short bowel syndrome: Requires TPN long-term, but can take po and is able to go several days without TPN.  - Plan to restart TPN once PICC reinserted.      Bacteremia associated with PICC: No associated SIRS. - Ancef and vancomycin started at admission per ID recommendations. GPC speciated to CoNS in only the pediatric bottle drawn off PICC. Suspected contaminant, so will DC vancomycin.  - Repeated blood cultures 2/27 (~15 hours after starting abx):   - Will monitor and reinsert PICC once negative x48 hours.   - Total duration of abx 14 days       GERD: Chronic, stable.  - Continue PPI  Anemia of chronic disease: Chronic, stable.  Chronic  systolic heart failure: Patient getting a significant amount antibiotics by IV and so we'll watch for any kind of volume overload - Continue coreg, ASA, cozaar - Strict I/O  DVT prophylaxis: Lovenox Code Status: DNR Family Communication: None at bedside this AM Disposition Plan: Ongoing inpatient management while awaiting blood culture results.   Consultants:   ID, Dr. Baxter Flattery, by phone   Procedures:   None  Antimicrobials:  Vancomycin 2/26 - 2/27  Ancef 2/26 >>    Subjective: Pt without fevers, chills, night sweats, dyspnea, chest pain, palpitations. Eating. No changes in vision or back pain.   Objective: Vitals:   07/07/16 1500 07/07/16 2152 07/08/16 0547 07/08/16 0758  BP:  (!) 89/50 121/61 119/70  Pulse:    (!) 52  Resp:  17 16   Temp:  98.4 F (36.9 C) 98.1 F (36.7 C)   TempSrc:  Oral Oral   SpO2:  98% 99%   Weight: 62.4 kg (137 lb 9.6 oz)     Height: 5\' 8"  (1.727 m)       Intake/Output Summary (Last 24 hours) at 07/08/16 1324 Last data filed at 07/08/16 0821  Gross per 24 hour  Intake           699.17 ml  Output                0 ml  Net           699.17 ml   Filed Weights   07/07/16 1500  Weight: 62.4 kg (137 lb 9.6 oz)    Examination: General exam: Pleasant 66yo female  in no distress Respiratory system: Non-labored breathing on room air. Clear to auscultation bilaterally.  Cardiovascular system: Regular rate and rhythm. No murmur, rub, or gallop. No JVD, and no pedal edema. Gastrointestinal system: Abdomen soft, non-tender, non-distended, with normoactive bowel sounds. No organomegaly or masses felt. Central nervous system: Alert and oriented. No focal neurological deficits. MSK: Warm, no deformities. No midline spine tenderness. Skin: No rashes, lesions no ulcers. Visualized subungual exam is normal.  Psychiatry: Judgement and insight appear normal. Mood & affect appropriate.   Data Reviewed: I have personally reviewed following labs and imaging  studies  CBC:  Recent Labs Lab 07/07/16 0851  WBC 6.6  HGB 8.9*  HCT 27.3*  MCV 91.9  PLT [id number]   Basic Metabolic Panel:  Recent Labs Lab 07/07/16 1538 07/08/16 0515  NA 142  --   K 3.1* 3.3*  CL 119*  --   CO2 20*  --   GLUCOSE 80  --   BUN 11  --   CREATININE 1.00 0.99  CALCIUM 8.2*  --    GFR: Estimated Creatinine Clearance: 55.1 mL/min (by C-G formula based on SCr of 0.99 mg/dL). Liver Function Tests: No results for input(s): AST, ALT, ALKPHOS, BILITOT, PROT, ALBUMIN in the last 168 hours. No results for input(s): LIPASE, AMYLASE in the last 168 hours. No results for input(s): AMMONIA in the last 168 hours. Coagulation Profile:  Recent Labs Lab 07/07/16 0851  INR 1.18   Cardiac Enzymes: No results for input(s): CKTOTAL, CKMB, CKMBINDEX, TROPONINI in the last 168 hours. BNP (last 3 results)  Recent Labs  09/10/15 0846  PROBNP 523.0*   HbA1C: No results for input(s): HGBA1C in the last 72 hours. CBG: No results for input(s): GLUCAP in the last 168 hours. Lipid Profile: No results for input(s): CHOL, HDL, LDLCALC, TRIG, CHOLHDL, LDLDIRECT in the last 72 hours. Thyroid Function Tests: No results for input(s): TSH, T4TOTAL, FREET4, T3FREE, THYROIDAB in the last 72 hours. Anemia Panel: No results for input(s): VITAMINB12, FOLATE, FERRITIN, TIBC, IRON, RETICCTPCT in the last 72 hours. Urine analysis:    Component Value Date/Time   COLORURINE YELLOW 09/17/2012 1122   APPEARANCEUR CLEAR 09/17/2012 1122   LABSPEC 1.019 09/17/2012 1122   PHURINE 5.5 09/17/2012 1122   GLUCOSEU NEGATIVE 09/17/2012 1122   HGBUR MODERATE (A) 09/17/2012 1122   HGBUR trace-intact 04/12/2010 0958   BILIRUBINUR NEGATIVE 09/17/2012 1122   BILIRUBINUR neg 04/08/2011 0947   KETONESUR NEGATIVE 09/17/2012 1122   PROTEINUR NEGATIVE 09/17/2012 1122   UROBILINOGEN 0.2 09/17/2012 1122   NITRITE NEGATIVE 09/17/2012 1122   LEUKOCYTESUR SMALL (A) 09/17/2012 1122   Recent Results (from  the past 240 hour(s))  Culture, blood (Routine X 2) w Reflex to ID Panel     Status: Abnormal   Collection Time: 07/04/16 10:24 AM  Result Value Ref Range Status   Specimen Description BLOOD PICC LINE  Final   Special Requests IN PEDIATRIC BOTTLE 0.5CC  Final   Culture  Setup Time   Final    GRAM NEGATIVE RODS AEROBIC BOTTLE ONLY CRITICAL RESULT CALLED TO, READ BACK BY AND VERIFIED WITH: Christianne Borrow AT [id number] 07/05/16 BY L BENFIELD    Culture (A)  Final    KLEBSIELLA PNEUMONIAE STAPHYLOCOCCUS SPECIES (COAGULASE NEGATIVE) THE SIGNIFICANCE OF ISOLATING THIS ORGANISM FROM A SINGLE SET OF BLOOD CULTURES WHEN MULTIPLE SETS ARE DRAWN IS UNCERTAIN. PLEASE NOTIFY THE MICROBIOLOGY DEPARTMENT WITHIN ONE WEEK IF SPECIATION AND SENSITIVITIES ARE REQUIRED. Performed at [facility], [place]  [address] [place], [place] [address]    Report Status 07/08/2016 FINAL  Final   Organism ID, Bacteria KLEBSIELLA PNEUMONIAE  Final      Susceptibility   Klebsiella pneumoniae - MIC*    AMPICILLIN >=32 RESISTANT Resistant     CEFAZOLIN <=4 SENSITIVE Sensitive     CEFEPIME <=1 SENSITIVE Sensitive     CEFTAZIDIME <=1 SENSITIVE Sensitive     CEFTRIAXONE <=1 SENSITIVE Sensitive     CIPROFLOXACIN <=0.25 SENSITIVE Sensitive     GENTAMICIN <=1 SENSITIVE Sensitive     IMIPENEM <=0.25 SENSITIVE Sensitive     TRIMETH/SULFA <=20 SENSITIVE Sensitive     AMPICILLIN/SULBACTAM 16 INTERMEDIATE Intermediate     PIP/TAZO 16 SENSITIVE Sensitive     Extended ESBL NEGATIVE Sensitive     * KLEBSIELLA PNEUMONIAE  Blood Culture ID Panel (Reflexed)     Status: Abnormal   Collection Time: 07/04/16 10:24 AM  Result Value Ref Range Status   Enterococcus species NOT DETECTED NOT DETECTED Final   Listeria monocytogenes NOT DETECTED NOT DETECTED Final   Staphylococcus species DETECTED (A) NOT DETECTED Final    Comment: Methicillin (oxacillin) resistant coagulase negative staphylococcus. Possible blood culture contaminant (unless  isolated from more than one blood culture draw or clinical case suggests pathogenicity). No antibiotic treatment is indicated for blood  culture contaminants. CRITICAL RESULT CALLED TO, READ BACK BY AND VERIFIED WITH: Christianne Borrow AT [id number] 07/05/16 BY L BENFIELD    Staphylococcus aureus NOT DETECTED NOT DETECTED Final   Methicillin resistance DETECTED (A) NOT DETECTED Final    Comment: CRITICAL RESULT CALLED TO, READ BACK BY AND VERIFIED WITH: Christianne Borrow AT [id number] 07/05/16 BY L BENFIELD    Streptococcus species NOT DETECTED NOT DETECTED Final   Streptococcus agalactiae NOT DETECTED NOT DETECTED Final   Streptococcus pneumoniae NOT DETECTED NOT DETECTED Final   Streptococcus pyogenes NOT DETECTED NOT DETECTED Final   Acinetobacter baumannii NOT DETECTED NOT DETECTED Final   Enterobacteriaceae species DETECTED (A) NOT DETECTED Final    Comment: Enterobacteriaceae represent a large family of gram-negative bacteria, not a single organism. CRITICAL RESULT CALLED TO, READ BACK BY AND VERIFIED WITH: Christianne Borrow AT [id number] 07/05/16 BY L BENFIELD    Enterobacter cloacae complex NOT DETECTED NOT DETECTED Final   Escherichia coli NOT DETECTED NOT DETECTED Final   Klebsiella oxytoca NOT DETECTED NOT DETECTED Final   Klebsiella pneumoniae DETECTED (A) NOT DETECTED Final    Comment: CRITICAL RESULT CALLED TO, READ BACK BY AND VERIFIED WITH: Christianne Borrow AT [id number] 07/05/16 BY L BENFIELD    Proteus species NOT DETECTED NOT DETECTED Final   Serratia marcescens NOT DETECTED NOT DETECTED Final   Carbapenem resistance NOT DETECTED NOT DETECTED Final   Haemophilus influenzae NOT DETECTED NOT DETECTED Final   Neisseria meningitidis NOT DETECTED NOT DETECTED Final   Pseudomonas aeruginosa NOT DETECTED NOT DETECTED Final   Candida albicans NOT DETECTED NOT DETECTED Final   Candida glabrata NOT DETECTED NOT DETECTED Final   Candida krusei NOT DETECTED NOT DETECTED Final   Candida parapsilosis NOT DETECTED NOT DETECTED  Final   Candida tropicalis NOT DETECTED NOT DETECTED Final    Comment: Performed at [facility], [place] [address], [place], [place] [address]  Culture, blood (Routine X 2) w Reflex to ID Panel     Status: Abnormal (Preliminary result)   Collection Time: 07/04/16 10:25 AM  Result Value Ref Range Status   Specimen Description BLOOD RIGHT ANTECUBITAL  Final   Special Requests BOTTLES  DRAWN AEROBIC AND ANAEROBIC 10CC  Final   Culture  Setup Time   Final    GRAM NEGATIVE RODS AEROBIC BOTTLE ONLY CRITICAL VALUE NOTED.  VALUE IS CONSISTENT WITH PREVIOUSLY REPORTED AND CALLED VALUE.    Culture (A)  Final    KLEBSIELLA PNEUMONIAE SUSCEPTIBILITIES PERFORMED ON PREVIOUS CULTURE WITHIN THE LAST 5 DAYS. Performed at [facility], [place] [address], [place], [place] [address]    Report Status PENDING  Incomplete  Cath Tip Culture     Status: None   Collection Time: 07/04/16 10:26 AM  Result Value Ref Range Status   Specimen Description CATH TIP  Final   Special Requests Normal  Final   Culture   Final    NO GROWTH 3 DAYS Performed at [facility], [address] [place], [place] [address]    Report Status 07/07/2016 FINAL  Final  Culture, blood (Routine X 2) w Reflex to ID Panel     Status: None (Preliminary result)   Collection Time: 07/04/16 10:53 AM  Result Value Ref Range Status   Specimen Description BLOOD LEFT HAND  Final   Special Requests PEDIATRICS 0.5CC  Final   Culture   Final    NO GROWTH 4 DAYS Performed at [facility], [place] [address], [place], [place] [address]    Report Status PENDING  Incomplete      Radiology Studies: No results found.  Scheduled Meds: . aspirin EC  81 mg Oral Daily  . carvedilol  3.125 mg Oral BID WC  .  ceFAZolin (ANCEF) IV  2 g Intravenous Q8H  . enoxaparin (LOVENOX) injection  40 mg Subcutaneous Q24H  . losartan  25 mg Oral Daily  . multivitamin with minerals  1 tablet Oral Daily  . pantoprazole  40 mg Oral Daily  .  Teduglutide (rDNA)  3.1 mg Subcutaneous Daily  . vancomycin  500 mg Intravenous Q12H   Continuous Infusions:   LOS: 1 day   Time spent: 25 minutes.  Vance Gather, MD [facility] Pager [phone]  If 7PM-7AM, please contact night-coverage [email or web address] Password [email or web address] 07/08/2016, 1:24 PM

## 2016-07-08 NOTE — Consult Note (Signed)
[facility]    Date of Admission:  07/07/2016           Day 2 cefazolin       Reason for Consult: PICC associated Klebsiella bacteremia    Referring Physician: Dr. Vance Gather  Principal Problem:   Bacteremia due to Klebsiella pneumoniae Active Problems:   Bacteremia associated with intravascular line ([place])   Anemia of chronic disease   Acquired short bowel syndrome   GERD (gastroesophageal reflux disease)   Chronic systolic heart failure ([place])   H/O mesenteric infarction   Degenerative joint disease   . aspirin EC  81 mg Oral Daily  . carvedilol  3.125 mg Oral BID WC  .  ceFAZolin (ANCEF) IV  2 g Intravenous Q8H  . enoxaparin (LOVENOX) injection  40 mg Subcutaneous Q24H  . losartan  25 mg Oral Daily  . multivitamin with minerals  1 tablet Oral Daily  . pantoprazole  40 mg Oral Daily  . potassium chloride  30 mEq Oral Once  . Teduglutide (rDNA)  3.1 mg Subcutaneous Daily    Recommendations: 1. Continue cefazolin 2. Hold off on PICC placement until we know repeat blood cultures are negative   Assessment: She has PICC associated Klebsiella bacteremia and has improved with PICC removal and initiation of cefazolin therapy. Her organism is sensitive to several oral antibiotics but I favor continued IV therapy because of the risk of malabsorption with oral antibiotic therapy. It should be safe to put in a new central line in 48 hours assuming repeat blood cultures are negative. Upon discharge I will consider switching to once daily ceftriaxone and treating for a total of 14 days. I think the coagulase-negative staph in the one blood culture drawn through her old PICC is likely to be a simple contaminant. I will follow-up tomorrow.    HPI: [name] [name] is a 66 y.o. 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 She has been receiving TPN 4  times weekly through a PICC. She recently began to develop chills and sweats during her nightly infusions of TPN. She had blood cultures drawn on 07/04/2016 and then the PICC was removed. Klebsiella was grown from 2 of 3 sets. Coagulase-negative staph was grown only from the one set drawn through the PICC before it was removed. As soon as her PICC was removed she stopped having chills and sweats. She is feeling back to normal. Because of the positive blood culture she was admitted yesterday for IV antibiotics.  She has a history of previous bloodstream infections related to her PICC and TPN therapy:  Escherichia coli bacteremia January 2017 Serratia bacteremia September 2016 Serratia bacteremia November 2015 Candidemia July 2013  Review of Systems: Review of Systems  Constitutional: Negative for chills, diaphoresis, fever, malaise/fatigue and weight loss.  HENT: Negative for sore throat.   Respiratory: Negative for cough, sputum production and shortness of breath.   Cardiovascular: Negative for chest pain.  Gastrointestinal: Positive for abdominal pain, diarrhea and nausea. Negative for heartburn and vomiting.  Genitourinary: Negative for dysuria and frequency.  Musculoskeletal: Positive for joint pain. Negative for myalgias.  Skin: Negative for rash.  Neurological: Negative for dizziness and headaches.  Psychiatric/Behavioral: Negative for depression and substance abuse. The patient is not nervous/anxious.     Past Medical History:  Diagnosis Date  . Abnormal LFTs 2013  .  Allergic rhinosinusitis   . Anemia of chronic disease   . At risk for dental problems   . Atypical nevus   . Bacterial overgrowth syndrome   . 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 Carotid stenosis    Carotid US (9/15):  R 40-59%; L 1-39% >> FU 1 year  . Congestive heart failure (CHF) ([facility])    EF 25-30%  . Diabetes mellitus    resolved after weight loss  . Fracture of left clavicle   . History of  blood transfusion 2013   anemia  . Hx of cardiovascular stress test    Myoview (9/15):  inf-apical scar; no ischemia; EF 47% - low risk   . 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 Osteoporosis   . Pancytopenia 10/07/2011  . Pathologic fracture of neck of femur ([place])   . Personal history of colonic polyps   . Renal insufficiency    hx of yrs ago  . Serratia marcescens infection - bactermia assoc w/ PICC 01/18/2015  . 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 Splenomegaly    By ultrasound  . Thrombophilia ([place])   . Vitamin D deficiency     Social History  Substance Use Topics  . Smoking status: Former Smoker    Types: Cigarettes    Quit date: 05/12/2004  . Smokeless tobacco: Never Used  . Alcohol use No    Family History  Problem Relation Age of Onset  . Diabetes Mother   . Hypertension Mother   . Colon cancer Neg Hx   . Stomach cancer Neg Hx    Allergies  Allergen Reactions  . Iohexol Itching and Other (See Comments)    Pt is able to use IVP dye if taken with Benadryl.    Marland Kitchen Penicillins Itching, Swelling and Other (See Comments)    Reaction:  Facial swelling Has patient had a PCN reaction causing immediate rash, facial/tongue/throat swelling, SOB or lightheadedness with hypotension: Yes Has patient had a PCN reaction causing severe rash involving mucus membranes or skin necrosis: No Has patient had a PCN reaction that required hospitalization No Has patient had a PCN reaction occurring within the last 10 years: No If all of the above answers are "NO", then may proceed with Cephalosporin use.    OBJECTIVE: Blood pressure 117/60, pulse (!) 45, temperature 98.2 F (36.8 C), temperature source Oral, resp. rate 16, height 5\' 8"  (1.727 m), weight 137 lb 9.6 oz (62.4 kg), last menstrual period 02/26/2014, SpO2 100 %.  Physical Exam  Constitutional:  She is smiling and in good spirits.  HENT:  Mouth/Throat: No oropharyngeal  exudate.  Cardiovascular: Normal rate and regular rhythm.   No murmur heard. Pulmonary/Chest: Effort normal and breath sounds normal.  Band-Aid over her previous left anterior chest central IV catheter site.  Abdominal: Soft. There is no tenderness.    Lab Results Lab Results  Component Value Date   WBC 6.6 07/07/2016   HGB 8.9 (L) 07/07/2016   HCT 27.3 (L) 07/07/2016   MCV 91.9 07/07/2016   PLT 156 07/07/2016    Lab Results  Component Value Date   CREATININE 0.99 07/08/2016   BUN 11 07/07/2016   NA 142 07/07/2016   K 3.3 (L) 07/08/2016   CL 119 (H) 07/07/2016   CO2 20 (L) 07/07/2016    Lab Results  Component Value Date   ALT 20 05/26/2016   AST 21 05/26/2016  ALKPHOS 60 05/26/2016   BILITOT 0.4 05/26/2016     Microbiology: Recent Results (from the past 240 hour(s))  Culture, blood (Routine X 2) w Reflex to ID Panel     Status: Abnormal   Collection Time: 07/04/16 10:24 AM  Result Value Ref Range Status   Specimen Description BLOOD PICC LINE  Final   Special Requests IN PEDIATRIC BOTTLE 0.5CC  Final   Culture  Setup Time   Final    GRAM NEGATIVE RODS AEROBIC BOTTLE ONLY CRITICAL RESULT CALLED TO, READ BACK BY AND VERIFIED WITH: Christianne Borrow AT [id number] 07/05/16 BY L BENFIELD    Culture (A)  Final    KLEBSIELLA PNEUMONIAE STAPHYLOCOCCUS SPECIES (COAGULASE NEGATIVE) THE SIGNIFICANCE OF ISOLATING THIS ORGANISM FROM A SINGLE SET OF BLOOD CULTURES WHEN MULTIPLE SETS ARE DRAWN IS UNCERTAIN. PLEASE NOTIFY THE MICROBIOLOGY DEPARTMENT WITHIN ONE WEEK IF SPECIATION AND SENSITIVITIES ARE REQUIRED. Performed at [facility], [place] [address], [place], [place] [address]    Report Status 07/08/2016 FINAL  Final   Organism ID, Bacteria KLEBSIELLA PNEUMONIAE  Final      Susceptibility   Klebsiella pneumoniae - MIC*    AMPICILLIN >=32 RESISTANT Resistant     CEFAZOLIN <=4 SENSITIVE Sensitive     CEFEPIME <=1 SENSITIVE Sensitive     CEFTAZIDIME <=1 SENSITIVE Sensitive      CEFTRIAXONE <=1 SENSITIVE Sensitive     CIPROFLOXACIN <=0.25 SENSITIVE Sensitive     GENTAMICIN <=1 SENSITIVE Sensitive     IMIPENEM <=0.25 SENSITIVE Sensitive     TRIMETH/SULFA <=20 SENSITIVE Sensitive     AMPICILLIN/SULBACTAM 16 INTERMEDIATE Intermediate     PIP/TAZO 16 SENSITIVE Sensitive     Extended ESBL NEGATIVE Sensitive     * KLEBSIELLA PNEUMONIAE  Blood Culture ID Panel (Reflexed)     Status: Abnormal   Collection Time: 07/04/16 10:24 AM  Result Value Ref Range Status   Enterococcus species NOT DETECTED NOT DETECTED Final   Listeria monocytogenes NOT DETECTED NOT DETECTED Final   Staphylococcus species DETECTED (A) NOT DETECTED Final    Comment: Methicillin (oxacillin) resistant coagulase negative staphylococcus. Possible blood culture contaminant (unless isolated from more than one blood culture draw or clinical case suggests pathogenicity). No antibiotic treatment is indicated for blood  culture contaminants. CRITICAL RESULT CALLED TO, READ BACK BY AND VERIFIED WITH: Christianne Borrow AT [id number] 07/05/16 BY L BENFIELD    Staphylococcus aureus NOT DETECTED NOT DETECTED Final   Methicillin resistance DETECTED (A) NOT DETECTED Final    Comment: CRITICAL RESULT CALLED TO, READ BACK BY AND VERIFIED WITH: Christianne Borrow AT [id number] 07/05/16 BY L BENFIELD    Streptococcus species NOT DETECTED NOT DETECTED Final   Streptococcus agalactiae NOT DETECTED NOT DETECTED Final   Streptococcus pneumoniae NOT DETECTED NOT DETECTED Final   Streptococcus pyogenes NOT DETECTED NOT DETECTED Final   Acinetobacter baumannii NOT DETECTED NOT DETECTED Final   Enterobacteriaceae species DETECTED (A) NOT DETECTED Final    Comment: Enterobacteriaceae represent a large family of gram-negative bacteria, not a single organism. CRITICAL RESULT CALLED TO, READ BACK BY AND VERIFIED WITH: Christianne Borrow AT [id number] 07/05/16 BY L BENFIELD    Enterobacter cloacae complex NOT DETECTED NOT DETECTED Final   Escherichia coli NOT  DETECTED NOT DETECTED Final   Klebsiella oxytoca NOT DETECTED NOT DETECTED Final   Klebsiella pneumoniae DETECTED (A) NOT DETECTED Final    Comment: CRITICAL RESULT CALLED TO, READ BACK BY AND VERIFIED WITH: Christianne Borrow AT [id number] 07/05/16 BY L BENFIELD  Proteus species NOT DETECTED NOT DETECTED Final   Serratia marcescens NOT DETECTED NOT DETECTED Final   Carbapenem resistance NOT DETECTED NOT DETECTED Final   Haemophilus influenzae NOT DETECTED NOT DETECTED Final   Neisseria meningitidis NOT DETECTED NOT DETECTED Final   Pseudomonas aeruginosa NOT DETECTED NOT DETECTED Final   Candida albicans NOT DETECTED NOT DETECTED Final   Candida glabrata NOT DETECTED NOT DETECTED Final   Candida krusei NOT DETECTED NOT DETECTED Final   Candida parapsilosis NOT DETECTED NOT DETECTED Final   Candida tropicalis NOT DETECTED NOT DETECTED Final    Comment: Performed at [facility], [place] [address], [place], [place] [address]  Culture, blood (Routine X 2) w Reflex to ID Panel     Status: Abnormal   Collection Time: 07/04/16 10:25 AM  Result Value Ref Range Status   Specimen Description BLOOD RIGHT ANTECUBITAL  Final   Special Requests BOTTLES DRAWN AEROBIC AND ANAEROBIC 10CC  Final   Culture  Setup Time   Final    GRAM NEGATIVE RODS AEROBIC BOTTLE ONLY CRITICAL VALUE NOTED.  VALUE IS CONSISTENT WITH PREVIOUSLY REPORTED AND CALLED VALUE.    Culture (A)  Final    KLEBSIELLA PNEUMONIAE SUSCEPTIBILITIES PERFORMED ON PREVIOUS CULTURE WITHIN THE LAST 5 DAYS. Performed at [facility], [place] [address], [place], [place] [address]    Report Status 07/08/2016 FINAL  Final  Cath Tip Culture     Status: None   Collection Time: 07/04/16 10:26 AM  Result Value Ref Range Status   Specimen Description CATH TIP  Final   Special Requests Normal  Final   Culture   Final    NO GROWTH 3 DAYS Performed at [facility], [place] [address] [place], [place] [address]    Report Status 07/07/2016 FINAL   Final  Culture, blood (Routine X 2) w Reflex to ID Panel     Status: None (Preliminary result)   Collection Time: 07/04/16 10:53 AM  Result Value Ref Range Status   Specimen Description BLOOD LEFT HAND  Final   Special Requests PEDIATRICS 0.5CC  Final   Culture   Final    NO GROWTH 4 DAYS Performed at [facility], [place] [address] [place], [place] [address]    Report Status PENDING  Incomplete    Michel Bickers, MD [facility] for [facility] [place] [facility] [phone] pager   [phone] cell 07/08/2016, 3:46 PM

## 2016-07-08 NOTE — Care Management Note (Addendum)
Case Management Note  Patient Details  Name: [name] [name] MRN: [id number] Date of Birth: 06-17-1950  Subjective/Objective:                  bacteremia Action/Plan: Discharge planning Expected Discharge Date:   (unknown)               Expected Discharge Plan:  [place]  In-House Referral:     Discharge planning Services  CM Consult  Post Acute Care Choice:  Home Health Choice offered to:  Patient  DME Arranged:  IV pump/equipment DME Agency:  Other - Comment  HH Arranged:  RN [place] Agency:  Other - See comment  Status of Service:  In process, will continue to follow  If discussed at Long Length of Stay Meetings, dates discussed:    Additional Comments: CM notes pt to get new PICC when MD deems appropriate.  Pt is active with [facility] and Almyra Free is the contact [phone] or [phone].  Almyra Free has reached out and has requested we fax discharge summary, resumption orders and if changes in formula, orders for changes.  Almyra Free has stated she will fax a packet of requested materials to me and I have given her 2 fax numbers but have not yet received packet.  CM will continue to follow for progress. Dellie Catholic, RN 07/08/2016, 3:24 PM

## 2016-07-09 DIAGNOSIS — R7881 Bacteremia: Secondary | ICD-10-CM

## 2016-07-09 DIAGNOSIS — L8990 Pressure ulcer of unspecified site, unspecified stage: Secondary | ICD-10-CM | POA: Insufficient documentation

## 2016-07-09 DIAGNOSIS — Z833 Family history of diabetes mellitus: Secondary | ICD-10-CM

## 2016-07-09 DIAGNOSIS — Z8249 Family history of ischemic heart disease and other diseases of the circulatory system: Secondary | ICD-10-CM

## 2016-07-09 LAB — BASIC METABOLIC PANEL
ANION GAP: 3 — AB (ref 5–15)
BUN: 7 mg/dL (ref 6–20)
CO2: 19 mmol/L — ABNORMAL LOW (ref 22–32)
Calcium: 8.1 mg/dL — ABNORMAL LOW (ref 8.9–10.3)
Chloride: 118 mmol/L — ABNORMAL HIGH (ref 101–111)
Creatinine, Ser: 1 mg/dL (ref 0.44–1.00)
GFR, EST NON AFRICAN AMERICAN: 57 mL/min — AB (ref >60)
Glucose, Bld: 74 mg/dL (ref 65–99)
POTASSIUM: 3.6 mmol/L (ref 3.5–5.1)
SODIUM: 140 mmol/L (ref 135–145)

## 2016-07-09 LAB — IRON AND TIBC
IRON: 26 ug/dL — AB (ref 28–170)
Saturation Ratios: 13 % (ref 10.4–31.8)
TIBC: 207 ug/dL — AB (ref 250–450)
UIBC: 181 ug/dL

## 2016-07-09 LAB — CBC
HEMATOCRIT: 24 % — AB (ref 36.0–46.0)
HEMOGLOBIN: 7.7 g/dL — AB (ref 12.0–15.0)
MCH: 29.8 pg (ref 26.0–34.0)
MCHC: 32.1 g/dL (ref 30.0–36.0)
MCV: 93 fL (ref 78.0–100.0)
Platelets: 122 10*3/uL — ABNORMAL LOW (ref 150–400)
RBC: 2.58 MIL/uL — ABNORMAL LOW (ref 3.87–5.11)
RDW: 15.7 % — AB (ref 11.5–15.5)
WBC: 6.9 10*3/uL (ref 4.0–10.5)

## 2016-07-09 LAB — FOLATE: Folate: 31.6 ng/mL

## 2016-07-09 LAB — MAGNESIUM: Magnesium: 1.8 mg/dL (ref 1.7–2.4)

## 2016-07-09 LAB — CULTURE, BLOOD (ROUTINE X 2): Culture: NO GROWTH

## 2016-07-09 LAB — RETICULOCYTES
RBC.: 2.72 MIL/uL — ABNORMAL LOW (ref 3.87–5.11)
Retic Count, Absolute: 43.5 10*3/uL (ref 19.0–186.0)
Retic Ct Pct: 1.6 % (ref 0.4–3.1)

## 2016-07-09 LAB — FERRITIN: FERRITIN: 266 ng/mL (ref 11–307)

## 2016-07-09 LAB — VITAMIN B12: Vitamin B-12: 482 pg/mL (ref 180–914)

## 2016-07-09 MED ORDER — MAGNESIUM SULFATE 2 GM/50ML IV SOLN
2.0000 g | Freq: Once | INTRAVENOUS | Status: AC
  Administered 2016-07-09: 2 g via INTRAVENOUS
  Filled 2016-07-09: qty 50

## 2016-07-09 MED ORDER — SODIUM CHLORIDE 0.9 % IV SOLN
INTRAVENOUS | Status: DC
  Administered 2016-07-09 – 2016-07-10 (×3): via INTRAVENOUS

## 2016-07-09 NOTE — Progress Notes (Signed)
Patient ID: [name] [name], female   DOB: [birth year], 66 y.o.   MRN: [id number]          [facility]  Date of Admission:  07/07/2016           Day 3 cefazolin  Principal Problem:   Bacteremia due to Klebsiella pneumoniae Active Problems:   Bacteremia associated with intravascular line (HCC)   Anemia of chronic disease   Acquired short bowel syndrome   GERD (gastroesophageal reflux disease)   Chronic systolic heart failure (HCC)   H/O mesenteric infarction   Degenerative joint disease   Pressure injury of skin   . aspirin EC  81 mg Oral Daily  .  ceFAZolin (ANCEF) IV  2 g Intravenous Q8H  . enoxaparin (LOVENOX) injection  40 mg Subcutaneous Q24H  . losartan  25 mg Oral Daily  . multivitamin with minerals  1 tablet Oral Daily  . pantoprazole  40 mg Oral Daily  . Teduglutide (rDNA)  3.1 mg Subcutaneous Daily    SUBJECTIVE: She is feeling much better and eager to go home. She has had no further fever, chills or sweats since her central catheter was removed on 07/04/2016.  Review of Systems: Review of Systems  Constitutional: Negative for chills, diaphoresis and fever.  Gastrointestinal: Positive for abdominal pain and diarrhea. Negative for nausea and vomiting.    Past Medical History:  Diagnosis Date  . Abnormal LFTs 2013  . Allergic rhinosinusitis   . Anemia of chronic disease   . At risk for dental problems   . Atypical nevus   . Bacterial overgrowth syndrome   . 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 Carotid stenosis    Carotid US (9/15):  R 40-59%; L 1-39% >> FU 1 year  . Congestive heart failure (CHF) (HCC)    EF 25-30%  . Diabetes mellitus    resolved after weight loss  . Fracture of left clavicle   . History of blood transfusion 2013   anemia  . Hx of cardiovascular stress test    Myoview (9/15):  inf-apical scar; no ischemia; EF 47% - low risk   . 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 Osteoporosis   .  Pancytopenia 10/07/2011  . Pathologic fracture of neck of femur ([place])   . Personal history of colonic polyps   . Renal insufficiency    hx of yrs ago  . Serratia marcescens infection - bactermia assoc w/ PICC 01/18/2015  . 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 Splenomegaly    By ultrasound  . Thrombophilia ([place])   . Vitamin D deficiency     Social History  Substance Use Topics  . Smoking status: Former Smoker    Types: Cigarettes    Quit date: 05/12/2004  . Smokeless tobacco: Never Used  . Alcohol use No    Family History  Problem Relation Age of Onset  . Diabetes Mother   . Hypertension Mother   . Colon cancer Neg Hx   . Stomach cancer Neg Hx    Allergies  Allergen Reactions  . Iohexol Itching and Other (See Comments)    Pt is able to use IVP dye if taken with Benadryl.    Marland Kitchen Penicillins Itching, Swelling and Other (See Comments)    Reaction:  Facial swelling Has patient had a PCN reaction causing immediate rash, facial/tongue/throat swelling, SOB or lightheadedness with hypotension: Yes Has  patient had a PCN reaction causing severe rash involving mucus membranes or skin necrosis: No Has patient had a PCN reaction that required hospitalization No Has patient had a PCN reaction occurring within the last 10 years: No If all of the above answers are "NO", then may proceed with Cephalosporin use.    OBJECTIVE: Vitals:   07/08/16 2057 07/09/16 0606 07/09/16 0953 07/09/16 1506  BP: (!) 110/53 121/63 108/75 124/63  Pulse: (!) 43 (!) 43 (!) 43 (!) 51  Resp: '16 19  18  '$ Temp: 98.2 F (36.8 C) 97.9 F (36.6 C)  98.6 F (37 C)  TempSrc: Oral Oral  Oral  SpO2: 97% 99%  100%  Weight:      Height:       Body mass index is 20.92 kg/m.  Physical Exam  Constitutional: She is oriented to person, place, and time.  She is smiling and in good spirits talking on her phone.  Cardiovascular: Normal rate and regular rhythm.   No murmur  heard. Pulmonary/Chest: Effort normal and breath sounds normal.  Abdominal: Soft. There is no tenderness.  Neurological: She is alert and oriented to person, place, and time.  Skin: No rash noted.  Psychiatric: Mood and affect normal.    Lab Results Lab Results  Component Value Date   WBC 6.9 07/09/2016   HGB 7.7 (L) 07/09/2016   HCT 24.0 (L) 07/09/2016   MCV 93.0 07/09/2016   PLT 122 (L) 07/09/2016    Lab Results  Component Value Date   CREATININE 1.00 07/09/2016   BUN 7 07/09/2016   NA 140 07/09/2016   K 3.6 07/09/2016   CL 118 (H) 07/09/2016   CO2 19 (L) 07/09/2016    Lab Results  Component Value Date   ALT 20 05/26/2016   AST 21 05/26/2016   ALKPHOS 60 05/26/2016   BILITOT 0.4 05/26/2016     Microbiology: Recent Results (from the past 240 hour(s))  Culture, blood (Routine X 2) w Reflex to ID Panel     Status: Abnormal   Collection Time: 07/04/16 10:24 AM  Result Value Ref Range Status   Specimen Description BLOOD PICC LINE  Final   Special Requests IN PEDIATRIC BOTTLE 0.5CC  Final   Culture  Setup Time   Final    GRAM NEGATIVE RODS AEROBIC BOTTLE ONLY CRITICAL RESULT CALLED TO, READ BACK BY AND VERIFIED WITH: Christianne Borrow AT 3403 07/05/16 BY L BENFIELD    Culture (A)  Final    KLEBSIELLA PNEUMONIAE STAPHYLOCOCCUS SPECIES (COAGULASE NEGATIVE) THE SIGNIFICANCE OF ISOLATING THIS ORGANISM FROM A SINGLE SET OF BLOOD CULTURES WHEN MULTIPLE SETS ARE DRAWN IS UNCERTAIN. PLEASE NOTIFY THE MICROBIOLOGY DEPARTMENT WITHIN ONE WEEK IF SPECIATION AND SENSITIVITIES ARE REQUIRED. Performed at [facility], [place] [address], [place],  [address]    Report Status 07/08/2016 FINAL  Final   Organism ID, Bacteria KLEBSIELLA PNEUMONIAE  Final      Susceptibility   Klebsiella pneumoniae - MIC*    AMPICILLIN >=32 RESISTANT Resistant     CEFAZOLIN <=4 SENSITIVE Sensitive     CEFEPIME <=1 SENSITIVE Sensitive     CEFTAZIDIME <=1 SENSITIVE Sensitive     CEFTRIAXONE <=1  SENSITIVE Sensitive     CIPROFLOXACIN <=0.25 SENSITIVE Sensitive     GENTAMICIN <=1 SENSITIVE Sensitive     IMIPENEM <=0.25 SENSITIVE Sensitive     TRIMETH/SULFA <=20 SENSITIVE Sensitive     AMPICILLIN/SULBACTAM 16 INTERMEDIATE Intermediate     PIP/TAZO 16 SENSITIVE Sensitive  Extended ESBL NEGATIVE Sensitive     * KLEBSIELLA PNEUMONIAE  Blood Culture ID Panel (Reflexed)     Status: Abnormal   Collection Time: 07/04/16 10:24 AM  Result Value Ref Range Status   Enterococcus species NOT DETECTED NOT DETECTED Final   Listeria monocytogenes NOT DETECTED NOT DETECTED Final   Staphylococcus species DETECTED (A) NOT DETECTED Final    Comment: Methicillin (oxacillin) resistant coagulase negative staphylococcus. Possible blood culture contaminant (unless isolated from more than one blood culture draw or clinical case suggests pathogenicity). No antibiotic treatment is indicated for blood  culture contaminants. CRITICAL RESULT CALLED TO, READ BACK BY AND VERIFIED WITH: Christianne Borrow AT 7510 07/05/16 BY L BENFIELD    Staphylococcus aureus NOT DETECTED NOT DETECTED Final   Methicillin resistance DETECTED (A) NOT DETECTED Final    Comment: CRITICAL RESULT CALLED TO, READ BACK BY AND VERIFIED WITH: Christianne Borrow AT 2585 07/05/16 BY L BENFIELD    Streptococcus species NOT DETECTED NOT DETECTED Final   Streptococcus agalactiae NOT DETECTED NOT DETECTED Final   Streptococcus pneumoniae NOT DETECTED NOT DETECTED Final   Streptococcus pyogenes NOT DETECTED NOT DETECTED Final   Acinetobacter baumannii NOT DETECTED NOT DETECTED Final   Enterobacteriaceae species DETECTED (A) NOT DETECTED Final    Comment: Enterobacteriaceae represent a large family of gram-negative bacteria, not a single organism. CRITICAL RESULT CALLED TO, READ BACK BY AND VERIFIED WITH: Christianne Borrow AT 2778 07/05/16 BY L BENFIELD    Enterobacter cloacae complex NOT DETECTED NOT DETECTED Final   Escherichia coli NOT DETECTED NOT DETECTED  Final   Klebsiella oxytoca NOT DETECTED NOT DETECTED Final   Klebsiella pneumoniae DETECTED (A) NOT DETECTED Final    Comment: CRITICAL RESULT CALLED TO, READ BACK BY AND VERIFIED WITH: Christianne Borrow AT 2423 07/05/16 BY L BENFIELD    Proteus species NOT DETECTED NOT DETECTED Final   Serratia marcescens NOT DETECTED NOT DETECTED Final   Carbapenem resistance NOT DETECTED NOT DETECTED Final   Haemophilus influenzae NOT DETECTED NOT DETECTED Final   Neisseria meningitidis NOT DETECTED NOT DETECTED Final   Pseudomonas aeruginosa NOT DETECTED NOT DETECTED Final   Candida albicans NOT DETECTED NOT DETECTED Final   Candida glabrata NOT DETECTED NOT DETECTED Final   Candida krusei NOT DETECTED NOT DETECTED Final   Candida parapsilosis NOT DETECTED NOT DETECTED Final   Candida tropicalis NOT DETECTED NOT DETECTED Final    Comment: Performed at [facility], [place] [address], [place], [place] [address]  Culture, blood (Routine X 2) w Reflex to ID Panel     Status: Abnormal   Collection Time: 07/04/16 10:25 AM  Result Value Ref Range Status   Specimen Description BLOOD RIGHT ANTECUBITAL  Final   Special Requests BOTTLES DRAWN AEROBIC AND ANAEROBIC 10CC  Final   Culture  Setup Time   Final    GRAM NEGATIVE RODS AEROBIC BOTTLE ONLY CRITICAL VALUE NOTED.  VALUE IS CONSISTENT WITH PREVIOUSLY REPORTED AND CALLED VALUE.    Culture (A)  Final    KLEBSIELLA PNEUMONIAE SUSCEPTIBILITIES PERFORMED ON PREVIOUS CULTURE WITHIN THE LAST 5 DAYS. Performed at [facility], [place] [address] [place], [place] [address]    Report Status 07/08/2016 FINAL  Final  Cath Tip Culture     Status: None   Collection Time: 07/04/16 10:26 AM  Result Value Ref Range Status   Specimen Description CATH TIP  Final   Special Requests Normal  Final   Culture   Final  NO GROWTH 3 DAYS Performed at [facility], [place] [address] [place], [place] [address]    Report Status 07/07/2016 FINAL  Final  Culture,  blood (Routine X 2) w Reflex to ID Panel     Status: None   Collection Time: 07/04/16 10:53 AM  Result Value Ref Range Status   Specimen Description BLOOD LEFT HAND  Final   Special Requests PEDIATRICS 0.5CC  Final   Culture   Final    NO GROWTH 5 DAYS Performed at [facility], [place] [address], [place], [place] [address]    Report Status 07/09/2016 FINAL  Final  Culture, blood (Routine X 2) w Reflex to ID Panel     Status: None (Preliminary result)   Collection Time: 07/08/16  8:23 AM  Result Value Ref Range Status   Specimen Description BLOOD RIGHT ARM  Final   Special Requests IN PEDIATRIC BOTTLE 2CC  Final   Culture   Final    NO GROWTH 1 DAY Performed at [facility], [place] [address], [place], [place] [address]    Report Status PENDING  Incomplete  Culture, blood (Routine X 2) w Reflex to ID Panel     Status: None (Preliminary result)   Collection Time: 07/08/16  8:25 AM  Result Value Ref Range Status   Specimen Description BLOOD RIGHT HAND  Final   Special Requests IN PEDIATRIC BOTTLE 3CC  Final   Culture   Final    NO GROWTH 1 DAY Performed at [facility], [place] [address], [place], [place] [address]    Report Status PENDING  Incomplete     ASSESSMENT: She had Klebsiella bacteremia related to her previous central catheter that she was using for TPN. She had prompt clinical improvement after the catheter was removed even before antibiotic therapy was started. If repeat blood cultures remain negative overnight she can have a new central line placed. I would continue antibiotic therapy for 14 total days. I would switch to ceftriaxone 1 g daily upon discharge to give her less frequent dosing.  PLAN: 1. Placement of central catheter and blood cultures remain negative overnight 2. Continue antibiotics for 11 more days. Changed to ceftriaxone 1 g daily upon discharge. 3. I will sign off now but please call my partners if we can be of further assistance while she  is here  Diagnosis: Central line associated Klebsiella bacteremia  Culture Result: Klebsiella pneumoniae  Allergies  Allergen Reactions  . Iohexol Itching and Other (See Comments)    Pt is able to use IVP dye if taken with Benadryl.    Marland Kitchen Penicillins Itching, Swelling and Other (See Comments)    Reaction:  Facial swelling Has patient had a PCN reaction causing immediate rash, facial/tongue/throat swelling, SOB or lightheadedness with hypotension: Yes Has patient had a PCN reaction causing severe rash involving mucus membranes or skin necrosis: No Has patient had a PCN reaction that required hospitalization No Has patient had a PCN reaction occurring within the last 10 years: No If all of the above answers are "NO", then may proceed with Cephalosporin use.    Discharge antibiotics: Per pharmacy protocol Ceftriaxone 1 g IV daily  Duration: 14 days End Date: 07/19/2016  [facility] Per Protocol:  Labs weekly while on IV antibiotics: __ CBC with differential __ BMP __ CMP __ CRP __ ESR __ Vancomycin trough  __ Leave her central catheter in place at completion of IV antibiotics for chronic TPN  Fax weekly labs to [phone]  Clinic Follow Up Appt: As needed   Michel Bickers, [facility] for [place] [phone] pager   [phone] cell 07/09/2016, 5:17 PM

## 2016-07-09 NOTE — Progress Notes (Signed)
Pharmacy Antibiotic Note  [name] [name] is a 66 y.o. female with hx short gut syndrome on chronicTPN with old PICC removed on 2/23 d/t bacteremia.  She was scheduled for IJ cath placement  on 2/26 but radiologist recom to postpone placement and admit patient for treatment of Bacteremia.  Patient reported she has been off TPN since 07/02/16 (was on TPN 4 days a week- [facility]; gets from [place]) and was only on doxycycline 100 mg bid for sinusitis (for 6 day txment and had 3 days of abx PTA). She was started on vancomycin and ancef for bacteremia which was narrowed to Ancef 2/27 based on blood cx. Of note, patient has PCN but tolerates cephalosporins.   She is clinically improving and renal function stable.  Microbiology results:  2/23 BCx x3: 1/3 CoNS, 2/3 GNR klebsiella pneum (R= ampi, I= unsayn);  2/23 cath tip: NG FINAL 2/27 BCx: sent  Plan: Day#3/14 - Continue Ancef 2 gm  IV q8h  -No further dose adjustments anticipated.  Pharmacy will sign off and monitor blood cx peripherally.  -Noted ID recommendations for Rocephin 2 gm IV daily at discharge to complete 14 days of antibiotics _____________________________  Height: 5\' 8"  (172.7 cm) Weight: 137 lb 9.6 oz (62.4 kg) IBW/kg (Calculated) : 63.9  Temp (24hrs), Avg:98.1 F (36.7 C), Min:97.9 F (36.6 C), Max:98.2 F (36.8 C)   Recent Labs Lab 07/07/16 0851 07/07/16 1538 07/08/16 0515 07/09/16 0528  WBC 6.6  --   --  6.9  CREATININE  --  1.00 0.99 1.00    Estimated Creatinine Clearance: 54.5 mL/min (by C-G formula based on SCr of 1 mg/dL).    Allergies  Allergen Reactions  . Iohexol Itching and Other (See Comments)    Pt is able to use IVP dye if taken with Benadryl.    Marland Kitchen Penicillins Itching, Swelling and Other (See Comments)    Reaction:  Facial swelling Has patient had a PCN reaction causing immediate rash, facial/tongue/throat swelling, SOB or lightheadedness with hypotension: Yes Has patient had a PCN  reaction causing severe rash involving mucus membranes or skin necrosis: No Has patient had a PCN reaction that required hospitalization No Has patient had a PCN reaction occurring within the last 10 years: No If all of the above answers are "NO", then may proceed with Cephalosporin use.    Thank you for allowing pharmacy to be a part of this patient's care.  Biagio Borg 07/09/2016 8:03 AM

## 2016-07-09 NOTE — Progress Notes (Addendum)
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 She had experienced night sweats and chills consistent with prior PICC infections, so had PICC line removed 2/23 and blood cultures drawn. Upon returning 2/26 to have a new PICC line placed in interventional radiology, cultures were positive for Klebsiella as well as GPC w/methicillin resistance on rapid panel. This was discussed with infectious disease who recommended patient come in for IV antibiotics through a peripheral line and repeating her blood cultures until those were clear. Hospitalists were called for further evaluation. She has remained asymptomatic.    Assessment & Plan:   Principal Problem:   Bacteremia due to Klebsiella pneumoniae Active Problems:   Anemia of chronic disease   Acquired short bowel syndrome   GERD (gastroesophageal reflux disease)   Chronic systolic heart failure (HCC)   H/O mesenteric infarction   Bacteremia associated with intravascular line (HCC)   Degenerative joint disease   Pressure injury of skin   1-Bacteremia, associated with picc line. She had central line removed last week.  Blood Culture from 2-23: Klebsiella and staph Coagulase negative,  Blood culture 2-27 no growth in 24 hours.  On Ancef Plan to to discharge on ceftriaxone for 2 weeks.  Plan to proceed with placement of tunnel catheter tomorrow if blood cultures remain negative.   2-Bradycardia, asymptomatic. Hold coreg. Replete mg.   3-Short bowel syndrome; on chronic TPN.  Resume TPN when central line is placed.   4-Anemia; check anemia panel. Will provide supplement as needed.    DVT prophylaxis: Lovenox Code Status: per staff  patient wishes to be full code.  Family Communication: care discussed with patient.  Disposition Plan: remain in patient, awaiting blood culture results.   Consultants:   ID>    Procedures: none   Antimicrobials:   Ancef 2-26   Subjective: She is doing well, feels she is dehydrated.  She has received injection for anemia by Dr Lindi Adie.  She was told peripheral picc line was not possible.   Objective: Vitals:   07/08/16 2057 07/09/16 0606 07/09/16 0953 07/09/16 1506  BP: (!) 110/53 121/63 108/75 124/63  Pulse: (!) 43 (!) 43 (!) 43 (!) 51  Resp: 16 19  18   Temp: 98.2 F (36.8 C) 97.9 F (36.6 C)  98.6 F (37 C)  TempSrc: Oral Oral  Oral  SpO2: 97% 99%  100%  Weight:      Height:        Intake/Output Summary (Last 24 hours) at 07/09/16 1526 Last data filed at 07/09/16 0950  Gross per 24 hour  Intake              700 ml  Output                0 ml  Net              700 ml   Filed Weights   07/07/16 1500  Weight: 62.4 kg (137 lb 9.6 oz)    Examination:  General exam: Appears calm and comfortable  Respiratory system: Clear to auscultation. Respiratory effort normal. Cardiovascular system: S1 & S2 heard, RRR. No JVD, murmurs, rubs, gallops or clicks. No pedal edema. Gastrointestinal system: Abdomen is nondistended, soft  and nontender. No organomegaly or masses felt. Normal bowel sounds heard. Central nervous system: Alert and oriented. No focal neurological deficits. Extremities: Symmetric 5 x 5 power. Skin: No rashes, lesions or ulcers     Data Reviewed: I have personally reviewed following labs and imaging studies  CBC:  Recent Labs Lab 07/07/16 0851 07/09/16 0528  WBC 6.6 6.9  HGB 8.9* 7.7*  HCT 27.3* 24.0*  MCV 91.9 93.0  PLT 156 [id number]*   Basic Metabolic Panel:  Recent Labs Lab 07/07/16 1538 07/08/16 0515 07/09/16 0528 07/09/16 1341  NA 142  --  140  --   K 3.1* 3.3* 3.6  --   CL 119*  --  118*  --   CO2 20*  --  19*  --   GLUCOSE 80   --  74  --   BUN 11  --  7  --   CREATININE 1.00 0.99 1.00  --   CALCIUM 8.2*  --  8.1*  --   MG  --   --   --  1.8   GFR: Estimated Creatinine Clearance: 54.5 mL/min (by C-G formula based on SCr of 1 mg/dL). Liver Function Tests: No results for input(s): AST, ALT, ALKPHOS, BILITOT, PROT, ALBUMIN in the last 168 hours. No results for input(s): LIPASE, AMYLASE in the last 168 hours. No results for input(s): AMMONIA in the last 168 hours. Coagulation Profile:  Recent Labs Lab 07/07/16 0851  INR 1.18   Cardiac Enzymes: No results for input(s): CKTOTAL, CKMB, CKMBINDEX, TROPONINI in the last 168 hours. BNP (last 3 results)  Recent Labs  09/10/15 0846  PROBNP 523.0*   HbA1C: No results for input(s): HGBA1C in the last 72 hours. CBG: No results for input(s): GLUCAP in the last 168 hours. Lipid Profile: No results for input(s): CHOL, HDL, LDLCALC, TRIG, CHOLHDL, LDLDIRECT in the last 72 hours. Thyroid Function Tests: No results for input(s): TSH, T4TOTAL, FREET4, T3FREE, THYROIDAB in the last 72 hours. Anemia Panel:  Recent Labs  07/09/16 1341  RETICCTPCT 1.6   Sepsis Labs: No results for input(s): PROCALCITON, LATICACIDVEN in the last 168 hours.  Recent Results (from the past 240 hour(s))  Culture, blood (Routine X 2) w Reflex to ID Panel     Status: Abnormal   Collection Time: 07/04/16 10:24 AM  Result Value Ref Range Status   Specimen Description BLOOD PICC LINE  Final   Special Requests IN PEDIATRIC BOTTLE 0.5CC  Final   Culture  Setup Time   Final    GRAM NEGATIVE RODS AEROBIC BOTTLE ONLY CRITICAL RESULT CALLED TO, READ BACK BY AND VERIFIED WITH: Christianne Borrow AT [id number] 07/05/16 BY L BENFIELD    Culture (A)  Final    KLEBSIELLA PNEUMONIAE STAPHYLOCOCCUS SPECIES (COAGULASE NEGATIVE) THE SIGNIFICANCE OF ISOLATING THIS ORGANISM FROM A SINGLE SET OF BLOOD CULTURES WHEN MULTIPLE SETS ARE DRAWN IS UNCERTAIN. PLEASE NOTIFY THE MICROBIOLOGY DEPARTMENT WITHIN ONE WEEK IF  SPECIATION AND SENSITIVITIES ARE REQUIRED. Performed at [facility], [place] [address], [place],  [address]    Report Status 07/08/2016 FINAL  Final   Organism ID, Bacteria KLEBSIELLA PNEUMONIAE  Final      Susceptibility   Klebsiella pneumoniae - MIC*    AMPICILLIN >=32 RESISTANT Resistant     CEFAZOLIN <=4 SENSITIVE Sensitive     CEFEPIME <=1 SENSITIVE Sensitive     CEFTAZIDIME <=1 SENSITIVE Sensitive     CEFTRIAXONE <=1 SENSITIVE Sensitive     CIPROFLOXACIN <=0.25 SENSITIVE Sensitive  GENTAMICIN <=1 SENSITIVE Sensitive     IMIPENEM <=0.25 SENSITIVE Sensitive     TRIMETH/SULFA <=20 SENSITIVE Sensitive     AMPICILLIN/SULBACTAM 16 INTERMEDIATE Intermediate     PIP/TAZO 16 SENSITIVE Sensitive     Extended ESBL NEGATIVE Sensitive     * KLEBSIELLA PNEUMONIAE  Blood Culture ID Panel (Reflexed)     Status: Abnormal   Collection Time: 07/04/16 10:24 AM  Result Value Ref Range Status   Enterococcus species NOT DETECTED NOT DETECTED Final   Listeria monocytogenes NOT DETECTED NOT DETECTED Final   Staphylococcus species DETECTED (A) NOT DETECTED Final    Comment: Methicillin (oxacillin) resistant coagulase negative staphylococcus. Possible blood culture contaminant (unless isolated from more than one blood culture draw or clinical case suggests pathogenicity). No antibiotic treatment is indicated for blood  culture contaminants. CRITICAL RESULT CALLED TO, READ BACK BY AND VERIFIED WITH: Christianne Borrow AT [id number] 07/05/16 BY L BENFIELD    Staphylococcus aureus NOT DETECTED NOT DETECTED Final   Methicillin resistance DETECTED (A) NOT DETECTED Final    Comment: CRITICAL RESULT CALLED TO, READ BACK BY AND VERIFIED WITH: Christianne Borrow AT [id number] 07/05/16 BY L BENFIELD    Streptococcus species NOT DETECTED NOT DETECTED Final   Streptococcus agalactiae NOT DETECTED NOT DETECTED Final   Streptococcus pneumoniae NOT DETECTED NOT DETECTED Final   Streptococcus pyogenes NOT DETECTED NOT DETECTED  Final   Acinetobacter baumannii NOT DETECTED NOT DETECTED Final   Enterobacteriaceae species DETECTED (A) NOT DETECTED Final    Comment: Enterobacteriaceae represent a large family of gram-negative bacteria, not a single organism. CRITICAL RESULT CALLED TO, READ BACK BY AND VERIFIED WITH: Christianne Borrow AT [id number] 07/05/16 BY L BENFIELD    Enterobacter cloacae complex NOT DETECTED NOT DETECTED Final   Escherichia coli NOT DETECTED NOT DETECTED Final   Klebsiella oxytoca NOT DETECTED NOT DETECTED Final   Klebsiella pneumoniae DETECTED (A) NOT DETECTED Final    Comment: CRITICAL RESULT CALLED TO, READ BACK BY AND VERIFIED WITH: Christianne Borrow AT [id number] 07/05/16 BY L BENFIELD    Proteus species NOT DETECTED NOT DETECTED Final   Serratia marcescens NOT DETECTED NOT DETECTED Final   Carbapenem resistance NOT DETECTED NOT DETECTED Final   Haemophilus influenzae NOT DETECTED NOT DETECTED Final   Neisseria meningitidis NOT DETECTED NOT DETECTED Final   Pseudomonas aeruginosa NOT DETECTED NOT DETECTED Final   Candida albicans NOT DETECTED NOT DETECTED Final   Candida glabrata NOT DETECTED NOT DETECTED Final   Candida krusei NOT DETECTED NOT DETECTED Final   Candida parapsilosis NOT DETECTED NOT DETECTED Final   Candida tropicalis NOT DETECTED NOT DETECTED Final    Comment: Performed at [facility], [place] [address], [place], [place] [address]  Culture, blood (Routine X 2) w Reflex to ID Panel     Status: Abnormal   Collection Time: 07/04/16 10:25 AM  Result Value Ref Range Status   Specimen Description BLOOD RIGHT ANTECUBITAL  Final   Special Requests BOTTLES DRAWN AEROBIC AND ANAEROBIC 10CC  Final   Culture  Setup Time   Final    GRAM NEGATIVE RODS AEROBIC BOTTLE ONLY CRITICAL VALUE NOTED.  VALUE IS CONSISTENT WITH PREVIOUSLY REPORTED AND CALLED VALUE.    Culture (A)  Final    KLEBSIELLA PNEUMONIAE SUSCEPTIBILITIES PERFORMED ON PREVIOUS CULTURE WITHIN THE LAST 5 DAYS. Performed at [facility], [place] [address], [place], [place] [address]    Report Status 07/08/2016 FINAL  Final  Cath Tip Culture  Status: None   Collection Time: 07/04/16 10:26 AM  Result Value Ref Range Status   Specimen Description CATH TIP  Final   Special Requests Normal  Final   Culture   Final    NO GROWTH 3 DAYS Performed at [facility], [address], [place], [place] [address]    Report Status 07/07/2016 FINAL  Final  Culture, blood (Routine X 2) w Reflex to ID Panel     Status: None   Collection Time: 07/04/16 10:53 AM  Result Value Ref Range Status   Specimen Description BLOOD LEFT HAND  Final   Special Requests PEDIATRICS 0.5CC  Final   Culture   Final    NO GROWTH 5 DAYS Performed at [facility], [place] [address], [place], [place] [address]    Report Status 07/09/2016 FINAL  Final  Culture, blood (Routine X 2) w Reflex to ID Panel     Status: None (Preliminary result)   Collection Time: 07/08/16  8:23 AM  Result Value Ref Range Status   Specimen Description BLOOD RIGHT ARM  Final   Special Requests IN PEDIATRIC BOTTLE 2CC  Final   Culture   Final    NO GROWTH 1 DAY Performed at [facility], [place] [address] [place], [place] [address]    Report Status PENDING  Incomplete  Culture, blood (Routine X 2) w Reflex to ID Panel     Status: None (Preliminary result)   Collection Time: 07/08/16  8:25 AM  Result Value Ref Range Status   Specimen Description BLOOD RIGHT HAND  Final   Special Requests IN PEDIATRIC BOTTLE 3CC  Final   Culture   Final    NO GROWTH 1 DAY Performed at [facility], [place] [address], [place], [place] [address]    Report Status PENDING  Incomplete         Radiology Studies: No results found.      Scheduled Meds: . aspirin EC  81 mg Oral Daily  .  ceFAZolin (ANCEF) IV  2 g Intravenous Q8H  . enoxaparin (LOVENOX) injection  40 mg Subcutaneous Q24H  . losartan  25 mg Oral Daily  . multivitamin with minerals  1 tablet Oral  Daily  . pantoprazole  40 mg Oral Daily  . Teduglutide (rDNA)  3.1 mg Subcutaneous Daily   Continuous Infusions: . sodium chloride       LOS: 2 days    Time spent: 35 minutes.     Elmarie Shiley, MD [facility] Pager [phone]  If 7PM-7AM, please contact night-coverage [email or web address] Password [facility] 07/09/2016, 3:26 PM

## 2016-07-10 ENCOUNTER — Encounter (HOSPITAL_COMMUNITY): Payer: Self-pay | Admitting: Interventional Radiology

## 2016-07-10 ENCOUNTER — Inpatient Hospital Stay (HOSPITAL_COMMUNITY): Payer: Medicare Other

## 2016-07-10 HISTORY — PX: IR GENERIC HISTORICAL: IMG1180011

## 2016-07-10 LAB — BASIC METABOLIC PANEL
ANION GAP: 5 (ref 5–15)
BUN: 7 mg/dL (ref 6–20)
CHLORIDE: 114 mmol/L — AB (ref 101–111)
CO2: 19 mmol/L — ABNORMAL LOW (ref 22–32)
Calcium: 7.9 mg/dL — ABNORMAL LOW (ref 8.9–10.3)
Creatinine, Ser: 0.97 mg/dL (ref 0.44–1.00)
GFR calc non Af Amer: 60 mL/min — ABNORMAL LOW (ref >60)
Glucose, Bld: 71 mg/dL (ref 65–99)
POTASSIUM: 3 mmol/L — AB (ref 3.5–5.1)
SODIUM: 138 mmol/L (ref 135–145)

## 2016-07-10 LAB — CBC
HEMATOCRIT: 24.2 % — AB (ref 36.0–46.0)
HEMOGLOBIN: 7.8 g/dL — AB (ref 12.0–15.0)
MCH: 29.9 pg (ref 26.0–34.0)
MCHC: 32.2 g/dL (ref 30.0–36.0)
MCV: 92.7 fL (ref 78.0–100.0)
Platelets: 136 10*3/uL — ABNORMAL LOW (ref 150–400)
RBC: 2.61 MIL/uL — AB (ref 3.87–5.11)
RDW: 15.9 % — ABNORMAL HIGH (ref 11.5–15.5)
WBC: 8.7 10*3/uL (ref 4.0–10.5)

## 2016-07-10 LAB — CREATININE, SERUM
Creatinine, Ser: 0.97 mg/dL (ref 0.44–1.00)
GFR, EST NON AFRICAN AMERICAN: 60 mL/min — AB (ref >60)

## 2016-07-10 MED ORDER — POTASSIUM CHLORIDE CRYS ER 20 MEQ PO TBCR
40.0000 meq | EXTENDED_RELEASE_TABLET | Freq: Once | ORAL | Status: AC
  Administered 2016-07-10: 40 meq via ORAL
  Filled 2016-07-10: qty 2

## 2016-07-10 MED ORDER — POTASSIUM CHLORIDE 10 MEQ/100ML IV SOLN
10.0000 meq | INTRAVENOUS | Status: AC
  Filled 2016-07-10 (×3): qty 100

## 2016-07-10 MED ORDER — IOPAMIDOL (ISOVUE-300) INJECTION 61%
INTRAVENOUS | Status: AC
  Administered 2016-07-10: 5 mL
  Filled 2016-07-10: qty 50

## 2016-07-10 MED ORDER — LIDOCAINE HCL 1 % IJ SOLN
INTRAMUSCULAR | Status: DC | PRN
  Administered 2016-07-10: 10 mL

## 2016-07-10 MED ORDER — DEXTROSE 5 % IV SOLN: 2.0000 g | INTRAVENOUS | 0 refills | Status: DC

## 2016-07-10 MED ORDER — LIDOCAINE HCL 1 % IJ SOLN
INTRAMUSCULAR | Status: AC
  Filled 2016-07-10: qty 20

## 2016-07-10 MED ORDER — FERROUS SULFATE 325 (65 FE) MG PO TABS
325.0000 mg | ORAL_TABLET | Freq: Two times a day (BID) | ORAL | Status: DC
  Administered 2016-07-10: 325 mg via ORAL
  Filled 2016-07-10: qty 1

## 2016-07-10 MED ORDER — POTASSIUM CHLORIDE CRYS ER 20 MEQ PO TBCR: 40.0000 meq | EXTENDED_RELEASE_TABLET | Freq: Two times a day (BID) | ORAL | 0 refills | Status: DC

## 2016-07-10 MED ORDER — FERROUS SULFATE 325 (65 FE) MG PO TABS: 325.0000 mg | ORAL_TABLET | Freq: Two times a day (BID) | ORAL | 3 refills | Status: DC

## 2016-07-10 MED ORDER — DEXTROSE 5 % IV SOLN
2.0000 g | INTRAVENOUS | Status: DC
  Administered 2016-07-10: 2 g via INTRAVENOUS
  Filled 2016-07-10 (×2): qty 2

## 2016-07-10 MED ORDER — POTASSIUM CHLORIDE CRYS ER 20 MEQ PO TBCR: 40.0000 meq | EXTENDED_RELEASE_TABLET | Freq: Once | ORAL | Status: DC

## 2016-07-10 MED ORDER — SODIUM CHLORIDE 0.9 % IV SOLN
30.0000 meq | Freq: Once | INTRAVENOUS | Status: DC
  Filled 2016-07-10: qty 15

## 2016-07-10 MED ORDER — DEXTROSE 5 % IV SOLN: 1.0000 g | INTRAVENOUS | Status: DC

## 2016-07-10 NOTE — Progress Notes (Signed)
Pt to be discharged today to home. Discharge instructions and prescriptions were gone over with pt and husband. IV site was removed. All questions and concerns were answered. Pt to be wheeled out by staff

## 2016-07-10 NOTE — Progress Notes (Signed)
New order for cardiac monitoring. Pt refusing at this time. Pt states that she does not want that if she is going to be discharged today. MD made aware.

## 2016-07-10 NOTE — Progress Notes (Addendum)
PHARMACY CONSULT NOTE FOR:  OUTPATIENT  PARENTERAL ANTIBIOTIC THERAPY (OPAT)  Indication: Klebsiella pneumoniae bacteremia Regimen: Ceftriaxone 2 grams IV daily (reviewed with Dr. Johnnye Sima from ID service) End date: Last dose on 07/19/16  IV antibiotic discharge orders are pended. To discharging provider:  please sign these orders via discharge navigator,  Select New Orders & click on the button choice - Manage This Unsigned Work.     Thank you for allowing pharmacy to be a part of this patient's care.  Clayburn Pert, PharmD, BCPS Pager: [phone] 07/10/2016  9:29 AM

## 2016-07-10 NOTE — Discharge Summary (Addendum)
Physician Discharge Summary  [name] [name] HLK:[id number] DOB: 1951-01-31 DOA: 07/07/2016  PCP: Annye Asa, MD  Admit date: 07/07/2016 Discharge date: 07/10/2016  Admitted From: Home  Disposition:  Home   Recommendations for Outpatient Follow-up:  1. Follow up with PCP in 1-2 weeks 2. Please obtain BMP/CBC in one week 3. Needs follow up EKG, follow potasium level.   Home Health; resume prior [facility] ordered.   Discharge Condition: sable.  CODE STATUS: Full code.  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 She had experienced night sweats and chills consistent with prior PICC infections, so had PICC line removed 2/23 and blood cultures drawn. Upon returning 2/26to have a new PICC line placed in interventional radiology,cultures were positive for Klebsiella as well as GPC w/methicillin resistance on rapid panel. This was discussed with infectious disease who recommended patient come in for IV antibiotics through a peripheral line and repeating her blood cultures until those were clear. Hospitalists were called for further evaluation. She has remained asymptomatic.    Assessment & Plan: 1-Bacteremia, associated with picc line. She had central line removed last week.  Blood Culture from 2-23: Klebsiella and staph Coagulase negative,  Blood culture 2-27 no growth in 48 hours.  Plan to discharge on ceftriaxone for 11 more days. Patient underwent placement of tunnel catheter 3-1  2-Bradycardia, asymptomatic. Hold coreg at discharge. Replete mg. Replete k.  IV k ordered. Patient decline. Importance was explain to patient.  Reviewed EKG with cardiology today. Patient asymptomatic.  Oral potasium ordered.   3-Short bowel syndrome; on chronic TPN.   Resume TPN when central line is placed.   4-Anemia; iron deficiency.  I wont be able to provide IV iron due to bacteremia.  Will provide prescription for oral iron.   5-Presurre ulcer stage II right buttocks.  local care, present on admission.     Discharge Diagnoses:  Principal Problem:   Bacteremia due to Klebsiella pneumoniae Active Problems:   Anemia of chronic disease   Acquired short bowel syndrome   GERD (gastroesophageal reflux disease)   Chronic systolic heart failure (HCC)   H/O mesenteric infarction   Bacteremia associated with intravascular line (HCC)   Degenerative joint disease   Pressure injury of skin    Discharge Instructions  Discharge Instructions    Diet - low sodium heart healthy    Complete by:  As directed    Home infusion instructions [facility] May follow [place] Dosing Protocol; May administer Cathflo as needed to maintain patency of vascular access device.; Flushing of vascular access device: per [facility] Protocol: 0.9% NaCl pre/post medica...    Complete by:  As directed    Instructions:  May follow [place] Dosing Protocol   Instructions:  May administer Cathflo as needed to maintain patency of vascular access device.   Instructions:  Flushing of vascular access device: per [facility] Protocol: 0.9% NaCl pre/post medication administration and prn patency; Heparin 100 u/ml, 6m for implanted ports and Heparin 10u/ml, 551mfor all other central venous catheters.   Instructions:  May follow [facility] Anaphylaxis Protocol for First Dose Administration in the home: 0.9% NaCl at 25-50 ml/hr to maintain IV access for protocol meds. Epinephrine 0.3 ml IV/IM PRN and Benadryl 25-50 IV/IM PRN s/s of anaphylaxis.   Instructions:  [facility] Infusion Coordinator (RN) to assist per patient IV care needs in the home PRN.   Increase activity slowly    Complete by:  As directed      Allergies as of 07/10/2016      Reactions   Iohexol Itching, Other  (See Comments)   Pt is able to use IVP dye if taken with Benadryl.     Penicillins Itching, Swelling, Other (See Comments)   Reaction:  Facial swelling Has patient had a PCN reaction causing immediate rash, facial/tongue/throat swelling, SOB or lightheadedness with hypotension: Yes Has patient had a PCN reaction causing severe rash involving mucus membranes or skin necrosis: No Has patient had a PCN reaction that required hospitalization No Has patient had a PCN reaction occurring within the last 10 years: No If all of the above answers are "NO", then may proceed with Cephalosporin use.      Medication List    STOP taking these medications   carvedilol 3.125 MG tablet Commonly known as:  COREG   ondansetron 8 MG disintegrating tablet Commonly known as:  ZOFRAN-ODT     TAKE these medications   ADULT TPN 1,800 mLs 4 (four) times a week. Pt receives home TPN from [facility]:  1800 mL bag, four nights weekly (Monday, Wednesday, Thursday, and Friday) for 8 hours (includes 1 hour taper up and down).   aspirin EC 81 MG tablet Take 1 tablet (81 mg total) by mouth daily.   BENADRYL 25 mg capsule Generic drug:  diphenhydrAMINE Take 25-50 mg by mouth every 6 (six) hours as needed for itching or sleep.   cefTRIAXone 2 g in dextrose 5 % 50 mL Inject 2 g into the vein daily.   cholecalciferol 1000 units tablet Commonly known as:  VITAMIN D Take 1,000 Units by mouth daily.   diphenoxylate-atropine 2.5-0.025 MG tablet Commonly known as:  LOMOTIL Take 1 tablet by mouth 4 (four) times daily as needed for diarrhea or loose stools.   ferrous sulfate 325 (65 FE) MG tablet Take 1 tablet (325 mg total) by mouth 2 (two) times daily with a meal.   ibuprofen 200 MG tablet Commonly known as:  ADVIL,MOTRIN Take 400 mg by mouth every 6 (six) hours as needed for headache, mild pain or moderate pain.   losartan 25 MG tablet Commonly known as:  COZAAR Take 1 tablet (25 mg total) by mouth daily.    multivitamin with minerals Tabs tablet Take 1 tablet by mouth daily.   omeprazole 40 MG capsule Commonly known as:  PRILOSEC Take 1 capsule (40 mg total) by mouth 2 (two) times daily. Take before breakfast and supper What changed:  when to take this  additional instructions   potassium chloride SA 20 MEQ tablet Commonly known as:  K-DUR,KLOR-CON Take 2 tablets (40 mEq total) by mouth 2 (two) times daily.   Teduglutide (rDNA) 5 MG Kit Commonly known as:  GATTEX Inject 3.3 Units into the skin daily. What changed:  how much to take   vitamin E 400 UNIT capsule Take 400 Units by mouth daily.            Home Infusion Instuctions        Start     Ordered   07/10/16 0000  Home infusion instructions [facility] May follow [place] Dosing Protocol; May administer Cathflo as needed to maintain patency of vascular access device.; Flushing of vascular access device: per [facility] Protocol: 0.9% NaCl pre/post medica...    Question  Answer Comment  Instructions May follow [place] Dosing Protocol   Instructions May administer Cathflo as needed to maintain patency of vascular access device.   Instructions Flushing of vascular access device: per [facility] Protocol: 0.9% NaCl pre/post medication administration and prn patency; Heparin 100 u/ml, 81m for implanted ports and Heparin 10u/ml, 53mfor all other central venous catheters.   Instructions May follow [facility] Anaphylaxis Protocol for First Dose Administration in the home: 0.9% NaCl at 25-50 ml/hr to maintain IV access for protocol meds. Epinephrine 0.3 ml IV/IM PRN and Benadryl 25-50 IV/IM PRN s/s of anaphylaxis.   Instructions [facility] Infusion Coordinator (RN) to assist per patient IV care needs in the home PRN.      07/10/16 1438      Allergies  Allergen Reactions  . Iohexol Itching and Other (See Comments)    Pt is able to use IVP dye if taken with Benadryl.    . Marland Kitchenenicillins Itching, Swelling and Other (See  Comments)    Reaction:  Facial swelling Has patient had a PCN reaction causing immediate rash, facial/tongue/throat swelling, SOB or lightheadedness with hypotension: Yes Has patient had a PCN reaction causing severe rash involving mucus membranes or skin necrosis: No Has patient had a PCN reaction that required hospitalization No Has patient had a PCN reaction occurring within the last 10 years: No If all of the above answers are "NO", then may proceed with Cephalosporin use.    Consultations:  ID   Procedures/Studies: Ir Fluoro Guide Cv Line Left  Result Date: 07/10/2016 CLINICAL DATA:  Short gut syndrome requiring TPN. Needs central IV access. Has had multiple bilateral upper extremity PICC lines as well as tunneled IJ catheters. EXAM: RIGHT IJ TUNNELED CENTRAL VENOUS CATHETER PLACEMENT UNDER ULTRASOUND AND FLUOROSCOPIC GUIDANCE FLUOROSCOPY TIME:  1 min 48 sec (12 mgy) TECHNIQUE: Initially, a left arm approach was selected. Upper arm prepped with chlorhexidine, draped in usual sterile fashion. Small tortuous upper arm veins were identified. Despite multiple approaches, a guidewire could not be passed to the axillary vein much less centrally. Attention was turned to the right. A right arm approach was attempted. Upper arm prepped with chlorhexidine, draped in usual sterile fashion. Under real-time ultrasound guidance, the brachial vein was accessed with a 21 gauge micropuncture needle. The guidewire would not advance beyond the axillary vein. Venography demonstrated occlusion of the right subclavian vein with a mature body wall collateral network providing upper extremity drainage. Survey ultrasound of the right neck demonstrated right IJ occlusion. A left IJ approach was then selected.Patency of the left IJ vein was confirmed with ultrasound with image documentation. An appropriate skin site was determined. Skin site was marked. Region was prepped using maximum barrier technique including cap and  mask, sterile gown, sterile gloves, large sterile sheet, and Chlorhexidine as cutaneous antisepsis. The region was infiltrated locally with 1% lidocaine. Under real-time ultrasound guidance, the left IJ vein was accessed with a 21 gauge micropuncture needle; the needle tip within the vein was confirmed with ultrasound image documentation. The needle exchanged over a guidewire for peel-away sheath through which an 23cm 5 Fr[place]owerPICC single lumen catheter was advanced. This was positioned with the tip at the cavoatrial junction. Spot chest radiograph shows good positioning and no pneumothorax. Catheter was flushed and sutured externally with 0-Prolene sutures. Patient tolerated the procedure well. COMPLICATIONS: None immediate IMPRESSION: 1. Technically successful left IJ double lumen power injectable central venous catheter placement. 2. Chronic right subclavian vein occlusion. Electronically Signed  By: Lucrezia Europe M.D.   On: 07/10/2016 13:56   Ir Removal Tun Cv Cath W/o Fl  Result Date: 07/04/2016 INDICATION: Patient with history of short-gut syndrome requiring chronic TPN. Fevers. Request removal of tunneled central venous catheter. EXAM: REMOVAL OF TUNNELED CENTRAL VENOUS CATHETER MEDICATIONS: None COMPLICATIONS: None immediate. PROCEDURE: Informed written consent was obtained from the patient following an explanation of the procedure, risks, benefits and alternatives to treatment. A time out was performed prior to the initiation of the procedure. Maximal barrier sterile technique was utilized including caps, mask, sterile gowns, sterile gloves, large sterile drape, hand hygiene, and chlorhexidine. 1% lidocaine was injected under sterile conditions along the subcutaneous tunnel. Utilizing a combination of blunt dissection and gentle traction, the catheter was removed intact. Tip was sent for culture. Hemostasis was obtained with manual compression. A dressing was placed. The patient tolerated the  procedure well without immediate post procedural complication. IMPRESSION: Successful removal of tunneled dialysis catheter. Read by: Ascencion Dike PA-C Electronically Signed   By: Markus Daft M.D.   On: 07/04/2016 11:28   Ir US Guide Vasc Access Left  Result Date: 07/10/2016 CLINICAL DATA:  Short gut syndrome requiring TPN. Needs central IV access. Has had multiple bilateral upper extremity PICC lines as well as tunneled IJ catheters. EXAM: RIGHT IJ TUNNELED CENTRAL VENOUS CATHETER PLACEMENT UNDER ULTRASOUND AND FLUOROSCOPIC GUIDANCE FLUOROSCOPY TIME:  1 min 48 sec (12 mgy) TECHNIQUE: Initially, a left arm approach was selected. Upper arm prepped with chlorhexidine, draped in usual sterile fashion. Small tortuous upper arm veins were identified. Despite multiple approaches, a guidewire could not be passed to the axillary vein much less centrally. Attention was turned to the right. A right arm approach was attempted. Upper arm prepped with chlorhexidine, draped in usual sterile fashion. Under real-time ultrasound guidance, the brachial vein was accessed with a 21 gauge micropuncture needle. The guidewire would not advance beyond the axillary vein. Venography demonstrated occlusion of the right subclavian vein with a mature body wall collateral network providing upper extremity drainage. Survey ultrasound of the right neck demonstrated right IJ occlusion. A left IJ approach was then selected.Patency of the left IJ vein was confirmed with ultrasound with image documentation. An appropriate skin site was determined. Skin site was marked. Region was prepped using maximum barrier technique including cap and mask, sterile gown, sterile gloves, large sterile sheet, and Chlorhexidine as cutaneous antisepsis. The region was infiltrated locally with 1% lidocaine. Under real-time ultrasound guidance, the left IJ vein was accessed with a 21 gauge micropuncture needle; the needle tip within the vein was confirmed with  ultrasound image documentation. The needle exchanged over a guidewire for peel-away sheath through which an 23cm 5 [place] PowerPICC single lumen catheter was advanced. This was positioned with the tip at the cavoatrial junction. Spot chest radiograph shows good positioning and no pneumothorax. Catheter was flushed and sutured externally with 0-Prolene sutures. Patient tolerated the procedure well. COMPLICATIONS: None immediate IMPRESSION: 1. Technically successful left IJ double lumen power injectable central venous catheter placement. 2. Chronic right subclavian vein occlusion. Electronically Signed   By: Lucrezia Europe M.D.   On: 07/10/2016 13:56   Xr Knee 3 View Right  Result Date: 06/23/2016 Films of the right knee replacements standing projection in 3 views. There is proximately 15 of valgus. There is complete collapse of the lateral compartment with subchondral sclerosis on both sides of the joint and peripheral osteophytes. There appears to be some faint calcification within the menisci possibly consistent  with  CPPD, decreased bone density      Subjective: Patient denies dizziness, chest pain or dyspnea.  She does not wants to wait to get IV potasium. Risk explain to patient.   Discharge Exam: Vitals:   07/10/16 0537 07/10/16 0921  BP: 131/62 119/67  Pulse: (!) 48 (!) 49  Resp:    Temp: 98.1 F (36.7 C)    Vitals:   07/09/16 1506 07/09/16 2019 07/10/16 0537 07/10/16 0921  BP: 124/63 (!) 93/54 131/62 119/67  Pulse: (!) 51  (!) 48 (!) 49  Resp: 18 18    Temp: 98.6 F (37 C) 97.8 F (36.6 C) 98.1 F (36.7 C)   TempSrc: Oral Oral Oral   SpO2: 100% 99% 94%   Weight:      Height:        General: Pt is alert, awake, not in acute distress Cardiovascular: RRR, S1/S2 +, no rubs, no gallops Respiratory: CTA bilaterally, no wheezing, no rhonchi Abdominal: Soft, NT, ND, bowel sounds + Extremities: no edema, no cyanosis    The results of significant diagnostics from this  hospitalization (including imaging, microbiology, ancillary and laboratory) are listed below for reference.     Microbiology: Recent Results (from the past 240 hour(s))  Culture, blood (Routine X 2) w Reflex to ID Panel     Status: Abnormal   Collection Time: 07/04/16 10:24 AM  Result Value Ref Range Status   Specimen Description BLOOD PICC LINE  Final   Special Requests IN PEDIATRIC BOTTLE 0.5CC  Final   Culture  Setup Time   Final    GRAM NEGATIVE RODS AEROBIC BOTTLE ONLY CRITICAL RESULT CALLED TO, READ BACK BY AND VERIFIED WITH: Christianne Borrow AT 0076 07/05/16 BY L BENFIELD    Culture (A)  Final    KLEBSIELLA PNEUMONIAE STAPHYLOCOCCUS SPECIES (COAGULASE NEGATIVE) THE SIGNIFICANCE OF ISOLATING THIS ORGANISM FROM A SINGLE SET OF BLOOD CULTURES WHEN MULTIPLE SETS ARE DRAWN IS UNCERTAIN. PLEASE NOTIFY THE MICROBIOLOGY DEPARTMENT WITHIN ONE WEEK IF SPECIATION AND SENSITIVITIES ARE REQUIRED. Performed at [facility], [place] [address], [place], [place] [address]    Report Status 07/08/2016 FINAL  Final   Organism ID, Bacteria KLEBSIELLA PNEUMONIAE  Final      Susceptibility   Klebsiella pneumoniae - MIC*    AMPICILLIN >=32 RESISTANT Resistant     CEFAZOLIN <=4 SENSITIVE Sensitive     CEFEPIME <=1 SENSITIVE Sensitive     CEFTAZIDIME <=1 SENSITIVE Sensitive     CEFTRIAXONE <=1 SENSITIVE Sensitive     CIPROFLOXACIN <=0.25 SENSITIVE Sensitive     GENTAMICIN <=1 SENSITIVE Sensitive     IMIPENEM <=0.25 SENSITIVE Sensitive     TRIMETH/SULFA <=20 SENSITIVE Sensitive     AMPICILLIN/SULBACTAM 16 INTERMEDIATE Intermediate     PIP/TAZO 16 SENSITIVE Sensitive     Extended ESBL NEGATIVE Sensitive     * KLEBSIELLA PNEUMONIAE  Blood Culture ID Panel (Reflexed)     Status: Abnormal   Collection Time: 07/04/16 10:24 AM  Result Value Ref Range Status   Enterococcus species NOT DETECTED NOT DETECTED Final   Listeria monocytogenes NOT DETECTED NOT DETECTED Final   Staphylococcus species DETECTED  (A) NOT DETECTED Final    Comment: Methicillin (oxacillin) resistant coagulase negative staphylococcus. Possible blood culture contaminant (unless isolated from more than one blood culture draw or clinical case suggests pathogenicity). No antibiotic treatment is indicated for blood  culture contaminants. CRITICAL RESULT CALLED TO, READ BACK BY AND VERIFIED WITH: Christianne Borrow AT 3545 07/05/16  BY L BENFIELD    Staphylococcus aureus NOT DETECTED NOT DETECTED Final   Methicillin resistance DETECTED (A) NOT DETECTED Final    Comment: CRITICAL RESULT CALLED TO, READ BACK BY AND VERIFIED WITH: Christianne Borrow AT 2836 07/05/16 BY L BENFIELD    Streptococcus species NOT DETECTED NOT DETECTED Final   Streptococcus agalactiae NOT DETECTED NOT DETECTED Final   Streptococcus pneumoniae NOT DETECTED NOT DETECTED Final   Streptococcus pyogenes NOT DETECTED NOT DETECTED Final   Acinetobacter baumannii NOT DETECTED NOT DETECTED Final   Enterobacteriaceae species DETECTED (A) NOT DETECTED Final    Comment: Enterobacteriaceae represent a large family of gram-negative bacteria, not a single organism. CRITICAL RESULT CALLED TO, READ BACK BY AND VERIFIED WITH: Christianne Borrow AT 6294 07/05/16 BY L BENFIELD    Enterobacter cloacae complex NOT DETECTED NOT DETECTED Final   Escherichia coli NOT DETECTED NOT DETECTED Final   Klebsiella oxytoca NOT DETECTED NOT DETECTED Final   Klebsiella pneumoniae DETECTED (A) NOT DETECTED Final    Comment: CRITICAL RESULT CALLED TO, READ BACK BY AND VERIFIED WITH: Christianne Borrow AT 7654 07/05/16 BY L BENFIELD    Proteus species NOT DETECTED NOT DETECTED Final   Serratia marcescens NOT DETECTED NOT DETECTED Final   Carbapenem resistance NOT DETECTED NOT DETECTED Final   Haemophilus influenzae NOT DETECTED NOT DETECTED Final   Neisseria meningitidis NOT DETECTED NOT DETECTED Final   Pseudomonas aeruginosa NOT DETECTED NOT DETECTED Final   Candida albicans NOT DETECTED NOT DETECTED Final    Candida glabrata NOT DETECTED NOT DETECTED Final   Candida krusei NOT DETECTED NOT DETECTED Final   Candida parapsilosis NOT DETECTED NOT DETECTED Final   Candida tropicalis NOT DETECTED NOT DETECTED Final    Comment: Performed at [facility], [place] [address], [place], [place] [address]  Culture, blood (Routine X 2) w Reflex to ID Panel     Status: Abnormal   Collection Time: 07/04/16 10:25 AM  Result Value Ref Range Status   Specimen Description BLOOD RIGHT ANTECUBITAL  Final   Special Requests BOTTLES DRAWN AEROBIC AND ANAEROBIC 10CC  Final   Culture  Setup Time   Final    GRAM NEGATIVE RODS AEROBIC BOTTLE ONLY CRITICAL VALUE NOTED.  VALUE IS CONSISTENT WITH PREVIOUSLY REPORTED AND CALLED VALUE.    Culture (A)  Final    KLEBSIELLA PNEUMONIAE SUSCEPTIBILITIES PERFORMED ON PREVIOUS CULTURE WITHIN THE LAST 5 DAYS. Performed at [facility], [place] [address], [place], [place] [address]    Report Status 07/08/2016 FINAL  Final  Cath Tip Culture     Status: None   Collection Time: 07/04/16 10:26 AM  Result Value Ref Range Status   Specimen Description CATH TIP  Final   Special Requests Normal  Final   Culture   Final    NO GROWTH 3 DAYS Performed at [facility], [place] [address], [place], [place] [address]    Report Status 07/07/2016 FINAL  Final  Culture, blood (Routine X 2) w Reflex to ID Panel     Status: None   Collection Time: 07/04/16 10:53 AM  Result Value Ref Range Status   Specimen Description BLOOD LEFT HAND  Final   Special Requests PEDIATRICS 0.5CC  Final   Culture   Final    NO GROWTH 5 DAYS Performed at [facility], [place] [address] [place], [place] [address]    Report Status 07/09/2016 FINAL  Final  Culture, blood (Routine X 2) w Reflex  to ID Panel     Status: None (Preliminary result)   Collection Time: 07/08/16  8:23 AM  Result Value Ref Range Status   Specimen Description BLOOD RIGHT ARM  Final   Special Requests IN PEDIATRIC BOTTLE 2CC   Final   Culture   Final    NO GROWTH 2 DAYS Performed at [facility], [address], [place], [place] [address]    Report Status PENDING  Incomplete  Culture, blood (Routine X 2) w Reflex to ID Panel     Status: None (Preliminary result)   Collection Time: 07/08/16  8:25 AM  Result Value Ref Range Status   Specimen Description BLOOD RIGHT HAND  Final   Special Requests IN PEDIATRIC BOTTLE 3CC  Final   Culture   Final    NO GROWTH 2 DAYS Performed at [facility], [place] [address], [place], [place] [address]    Report Status PENDING  Incomplete     Labs: BNP (last 3 results) No results for input(s): BNP in the last 8760 hours. Basic Metabolic Panel:  Recent Labs Lab 07/07/16 1538 07/08/16 0515 07/09/16 0528 07/09/16 1341 07/10/16 0535 07/10/16 1007  NA 142  --  140  --   --  138  K 3.1* 3.3* 3.6  --   --  3.0*  CL 119*  --  118*  --   --  114*  CO2 20*  --  19*  --   --  19*  GLUCOSE 80  --  74  --   --  71  BUN 11  --  7  --   --  7  CREATININE 1.00 0.99 1.00  --  0.97 0.97  CALCIUM 8.2*  --  8.1*  --   --  7.9*  MG  --   --   --  1.8  --   --    Liver Function Tests: No results for input(s): AST, ALT, ALKPHOS, BILITOT, PROT, ALBUMIN in the last 168 hours. No results for input(s): LIPASE, AMYLASE in the last 168 hours. No results for input(s): AMMONIA in the last 168 hours. CBC:  Recent Labs Lab 07/07/16 0851 07/09/16 0528 07/10/16 0535  WBC 6.6 6.9 8.7  HGB 8.9* 7.7* 7.8*  HCT 27.3* 24.0* 24.2*  MCV 91.9 93.0 92.7  PLT 156 122* 136*   Cardiac Enzymes: No results for input(s): CKTOTAL, CKMB, CKMBINDEX, TROPONINI in the last 168 hours. BNP: Invalid input(s): POCBNP CBG: No results for input(s): GLUCAP in the last 168 hours. D-Dimer No results for input(s): DDIMER in the last 72 hours. Hgb A1c No results for input(s): HGBA1C in the last 72 hours. Lipid Profile No results for input(s): CHOL, HDL, LDLCALC, TRIG, CHOLHDL, LDLDIRECT in the  last 72 hours. Thyroid function studies No results for input(s): TSH, T4TOTAL, T3FREE, THYROIDAB in the last 72 hours.  Invalid input(s): FREET3 Anemia work up  Recent Labs  07/09/16 1341  VITAMINB12 482  FOLATE 31.6  FERRITIN 266  TIBC 207*  IRON 26*  RETICCTPCT 1.6   Urinalysis    Component Value Date/Time   COLORURINE YELLOW 09/17/2012 1122   APPEARANCEUR CLEAR 09/17/2012 1122   LABSPEC 1.019 09/17/2012 1122   PHURINE 5.5 09/17/2012 1122   GLUCOSEU NEGATIVE 09/17/2012 1122   HGBUR MODERATE (A) 09/17/2012 1122   HGBUR trace-intact 04/12/2010 0958   BILIRUBINUR NEGATIVE 09/17/2012 1122   BILIRUBINUR neg 04/08/2011 [place] 09/17/2012 1122   PROTEINUR NEGATIVE 09/17/2012 1122  UROBILINOGEN 0.2 09/17/2012 1122   NITRITE NEGATIVE 09/17/2012 1122   LEUKOCYTESUR SMALL (A) 09/17/2012 1122   Sepsis Labs Invalid input(s): PROCALCITONIN,  WBC,  LACTICIDVEN Microbiology Recent Results (from the past 240 hour(s))  Culture, blood (Routine X 2) w Reflex to ID Panel     Status: Abnormal   Collection Time: 07/04/16 10:24 AM  Result Value Ref Range Status   Specimen Description BLOOD PICC LINE  Final   Special Requests IN PEDIATRIC BOTTLE 0.5CC  Final   Culture  Setup Time   Final    GRAM NEGATIVE RODS AEROBIC BOTTLE ONLY CRITICAL RESULT CALLED TO, READ BACK BY AND VERIFIED WITH: Christianne Borrow AT 6256 07/05/16 BY L BENFIELD    Culture (A)  Final    KLEBSIELLA PNEUMONIAE STAPHYLOCOCCUS SPECIES (COAGULASE NEGATIVE) THE SIGNIFICANCE OF ISOLATING THIS ORGANISM FROM A SINGLE SET OF BLOOD CULTURES WHEN MULTIPLE SETS ARE DRAWN IS UNCERTAIN. PLEASE NOTIFY THE MICROBIOLOGY DEPARTMENT WITHIN ONE WEEK IF SPECIATION AND SENSITIVITIES ARE REQUIRED. Performed at [facility], [place] [address], [place], [place] [address]    Report Status 07/08/2016 FINAL  Final   Organism ID, Bacteria KLEBSIELLA PNEUMONIAE  Final      Susceptibility   Klebsiella pneumoniae - MIC*     AMPICILLIN >=32 RESISTANT Resistant     CEFAZOLIN <=4 SENSITIVE Sensitive     CEFEPIME <=1 SENSITIVE Sensitive     CEFTAZIDIME <=1 SENSITIVE Sensitive     CEFTRIAXONE <=1 SENSITIVE Sensitive     CIPROFLOXACIN <=0.25 SENSITIVE Sensitive     GENTAMICIN <=1 SENSITIVE Sensitive     IMIPENEM <=0.25 SENSITIVE Sensitive     TRIMETH/SULFA <=20 SENSITIVE Sensitive     AMPICILLIN/SULBACTAM 16 INTERMEDIATE Intermediate     PIP/TAZO 16 SENSITIVE Sensitive     Extended ESBL NEGATIVE Sensitive     * KLEBSIELLA PNEUMONIAE  Blood Culture ID Panel (Reflexed)     Status: Abnormal   Collection Time: 07/04/16 10:24 AM  Result Value Ref Range Status   Enterococcus species NOT DETECTED NOT DETECTED Final   Listeria monocytogenes NOT DETECTED NOT DETECTED Final   Staphylococcus species DETECTED (A) NOT DETECTED Final    Comment: Methicillin (oxacillin) resistant coagulase negative staphylococcus. Possible blood culture contaminant (unless isolated from more than one blood culture draw or clinical case suggests pathogenicity). No antibiotic treatment is indicated for blood  culture contaminants. CRITICAL RESULT CALLED TO, READ BACK BY AND VERIFIED WITH: Christianne Borrow AT 3428 07/05/16 BY L BENFIELD    Staphylococcus aureus NOT DETECTED NOT DETECTED Final   Methicillin resistance DETECTED (A) NOT DETECTED Final    Comment: CRITICAL RESULT CALLED TO, READ BACK BY AND VERIFIED WITH: Christianne Borrow AT 7681 07/05/16 BY L BENFIELD    Streptococcus species NOT DETECTED NOT DETECTED Final   Streptococcus agalactiae NOT DETECTED NOT DETECTED Final   Streptococcus pneumoniae NOT DETECTED NOT DETECTED Final   Streptococcus pyogenes NOT DETECTED NOT DETECTED Final   Acinetobacter baumannii NOT DETECTED NOT DETECTED Final   Enterobacteriaceae species DETECTED (A) NOT DETECTED Final    Comment: Enterobacteriaceae represent a large family of gram-negative bacteria, not a single organism. CRITICAL RESULT CALLED TO, READ BACK BY  AND VERIFIED WITH: Christianne Borrow AT 1572 07/05/16 BY L BENFIELD    Enterobacter cloacae complex NOT DETECTED NOT DETECTED Final   Escherichia coli NOT DETECTED NOT DETECTED Final   Klebsiella oxytoca NOT DETECTED NOT DETECTED Final   Klebsiella pneumoniae DETECTED (A) NOT DETECTED Final    Comment: CRITICAL RESULT  CALLED TO, READ BACK BY AND VERIFIED WITH: Christianne Borrow AT 7902 07/05/16 BY L BENFIELD    Proteus species NOT DETECTED NOT DETECTED Final   Serratia marcescens NOT DETECTED NOT DETECTED Final   Carbapenem resistance NOT DETECTED NOT DETECTED Final   Haemophilus influenzae NOT DETECTED NOT DETECTED Final   Neisseria meningitidis NOT DETECTED NOT DETECTED Final   Pseudomonas aeruginosa NOT DETECTED NOT DETECTED Final   Candida albicans NOT DETECTED NOT DETECTED Final   Candida glabrata NOT DETECTED NOT DETECTED Final   Candida krusei NOT DETECTED NOT DETECTED Final   Candida parapsilosis NOT DETECTED NOT DETECTED Final   Candida tropicalis NOT DETECTED NOT DETECTED Final    Comment: Performed at [facility], [place] [address], [place], [place] [address]  Culture, blood (Routine X 2) w Reflex to ID Panel     Status: Abnormal   Collection Time: 07/04/16 10:25 AM  Result Value Ref Range Status   Specimen Description BLOOD RIGHT ANTECUBITAL  Final   Special Requests BOTTLES DRAWN AEROBIC AND ANAEROBIC 10CC  Final   Culture  Setup Time   Final    GRAM NEGATIVE RODS AEROBIC BOTTLE ONLY CRITICAL VALUE NOTED.  VALUE IS CONSISTENT WITH PREVIOUSLY REPORTED AND CALLED VALUE.    Culture (A)  Final    KLEBSIELLA PNEUMONIAE SUSCEPTIBILITIES PERFORMED ON PREVIOUS CULTURE WITHIN THE LAST 5 DAYS. Performed at [facility], [place] [address] [place], [place] [address]    Report Status 07/08/2016 FINAL  Final  Cath Tip Culture     Status: None   Collection Time: 07/04/16 10:26 AM  Result Value Ref Range Status   Specimen Description CATH TIP  Final   Special Requests Normal  Final    Culture   Final    NO GROWTH 3 DAYS Performed at [facility], [place] [address] [place], [place] [address]    Report Status 07/07/2016 FINAL  Final  Culture, blood (Routine X 2) w Reflex to ID Panel     Status: None   Collection Time: 07/04/16 10:53 AM  Result Value Ref Range Status   Specimen Description BLOOD LEFT HAND  Final   Special Requests PEDIATRICS 0.5CC  Final   Culture   Final    NO GROWTH 5 DAYS Performed at [facility], [place] [address], [place], [place] [address]    Report Status 07/09/2016 FINAL  Final  Culture, blood (Routine X 2) w Reflex to ID Panel     Status: None (Preliminary result)   Collection Time: 07/08/16  8:23 AM  Result Value Ref Range Status   Specimen Description BLOOD RIGHT ARM  Final   Special Requests IN PEDIATRIC BOTTLE 2CC  Final   Culture   Final    NO GROWTH 2 DAYS Performed at [facility], [place] [address], [place], [place] [address]    Report Status PENDING  Incomplete  Culture, blood (Routine X 2) w Reflex to ID Panel     Status: None (Preliminary result)   Collection Time: 07/08/16  8:25 AM  Result Value Ref Range Status   Specimen Description BLOOD RIGHT HAND  Final   Special Requests IN PEDIATRIC BOTTLE 3CC  Final   Culture   Final    NO GROWTH 2 DAYS Performed at [facility], [place] [address] [place], [place] [address]    Report Status PENDING  Incomplete     Time coordinating discharge: Over 30 minutes  SIGNED:  Elmarie Shiley, MD  [facility] 07/10/2016, 2:41 PM Pager   If 7PM-7AM, please contact night-coverage [email or web address] Password [email or web address]

## 2016-07-10 NOTE — Progress Notes (Addendum)
CM faxed facesheet, orders, face to face, H&P, ID consult, labs, DC summary and IV prescription to Newt Minion [phone] of [facility] ([facility]) who pt is active with for TPN.  CM received confirmation of receipt.  NO other CM needs were communicated.

## 2016-07-10 NOTE — Progress Notes (Signed)
Pt just arrived back from IR. Pt refusing IV potassium. Pt educated and knows potassium level. Pt still does not want IV potassium. MD made aware.

## 2016-07-11 ENCOUNTER — Telehealth: Payer: Self-pay | Admitting: *Deleted

## 2016-07-11 NOTE — Telephone Encounter (Signed)
Per Discharge Summary:   PCP: Annye Asa, MD  Admit date: 07/07/2016 Discharge date: 07/10/2016  Admitted From: Home  Disposition:  Home   Recommendations for Outpatient Follow-up:  1. Follow up with PCP in 1-2 weeks 2. Please obtain BMP/CBC in one week 3. Needs follow up EKG, follow potasium level.   Home Health; resume prior [facility] ordered.   Discharge Condition: sable.  CODE STATUS: Full code.  Diet recommendation: Heart Healthy  ---  Transition Care Management Follow-up Telephone Call   How have you been since you were released from the hospital? "Fine just fine"   Do you understand why you were in the hospital? yes   Do you understand the discharge instructions? yes   Where were you discharged to? Home   Items Reviewed:  Medications reviewed: yes  Allergies reviewed: yes  Dietary changes reviewed: yes  Referrals reviewed: yes   Functional Questionnaire:   Activities of Daily Living (ADLs):   She states they are independent in the following: ambulation, bathing and hygiene, feeding, continence, grooming, toileting and dressing States they require assistance with the following: none   Any transportation issues/concerns?: no   Any patient concerns? Yes. Pt would like to discuss Coreg, the hospital took her off of Coreg. She has other concerns as well that she did not wish to discuss over the phone.   Confirmed importance and date/time of follow-up visits scheduled: Yes  Provider Appointment booked with Dr Birdie Riddle 07/21/16 at 11:00.  Confirmed with patient if condition begins to worsen call PCP or go to the ER.  Patient was given the office number and encouraged to call back with question or concerns.  : yes

## 2016-07-13 LAB — CULTURE, BLOOD (ROUTINE X 2)
Culture: NO GROWTH
Culture: NO GROWTH

## 2016-07-17 ENCOUNTER — Other Ambulatory Visit (INDEPENDENT_AMBULATORY_CARE_PROVIDER_SITE_OTHER): Payer: Medicare Other

## 2016-07-17 DIAGNOSIS — K6389 Other specified diseases of intestine: Secondary | ICD-10-CM

## 2016-07-17 DIAGNOSIS — K912 Postsurgical malabsorption, not elsewhere classified: Secondary | ICD-10-CM

## 2016-07-17 LAB — BILIRUBIN, DIRECT: BILIRUBIN DIRECT: 0.1 mg/dL (ref 0.0–0.3)

## 2016-07-17 LAB — CBC WITH DIFFERENTIAL/PLATELET
BASOS PCT: 0.8 % (ref 0.0–3.0)
Basophils Absolute: 0.1 10*3/uL (ref 0.0–0.1)
EOS PCT: 4 % (ref 0.0–5.0)
Eosinophils Absolute: 0.4 10*3/uL (ref 0.0–0.7)
HCT: 28.3 % — ABNORMAL LOW (ref 36.0–46.0)
HEMOGLOBIN: 9.3 g/dL — AB (ref 12.0–15.0)
LYMPHS ABS: 3 10*3/uL (ref 0.7–4.0)
Lymphocytes Relative: 30.5 % (ref 12.0–46.0)
MCHC: 33 g/dL (ref 30.0–36.0)
MCV: 94.3 fl (ref 78.0–100.0)
MONO ABS: 1 10*3/uL (ref 0.1–1.0)
MONOS PCT: 10.5 % (ref 3.0–12.0)
Neutro Abs: 5.3 10*3/uL (ref 1.4–7.7)
Neutrophils Relative %: 54.2 % (ref 43.0–77.0)
Platelets: 146 10*3/uL — ABNORMAL LOW (ref 150.0–400.0)
RBC: 3.01 Mil/uL — ABNORMAL LOW (ref 3.87–5.11)
RDW: 16.3 % — AB (ref 11.5–15.5)
WBC: 9.8 10*3/uL (ref 4.0–10.5)

## 2016-07-17 LAB — COMPREHENSIVE METABOLIC PANEL
ALBUMIN: 3.1 g/dL — AB (ref 3.5–5.2)
ALT: 8 U/L (ref 0–35)
AST: 14 U/L (ref 0–37)
Alkaline Phosphatase: 75 U/L (ref 39–117)
BUN: 18 mg/dL (ref 6–23)
CALCIUM: 8.7 mg/dL (ref 8.4–10.5)
CHLORIDE: 113 meq/L — AB (ref 96–112)
CO2: 22 mEq/L (ref 19–32)
Creatinine, Ser: 0.82 mg/dL (ref 0.40–1.20)
GFR: 74.09 mL/min (ref >60.00)
Glucose, Bld: 57 mg/dL — ABNORMAL LOW (ref 70–99)
POTASSIUM: 3.4 meq/L — AB (ref 3.5–5.1)
Sodium: 141 mEq/L (ref 135–145)
Total Bilirubin: 0.3 mg/dL (ref 0.2–1.2)
Total Protein: 7.2 g/dL (ref 6.0–8.3)

## 2016-07-17 LAB — MAGNESIUM: MAGNESIUM: 2.1 mg/dL (ref 1.5–2.5)

## 2016-07-17 LAB — C-REACTIVE PROTEIN: CRP: 1 mg/dL (ref 0.5–20.0)

## 2016-07-17 LAB — TRIGLYCERIDES: TRIGLYCERIDES: 57 mg/dL (ref 0.0–149.0)

## 2016-07-18 LAB — PREALBUMIN: PREALBUMIN: 13 mg/dL — AB (ref 17–34)

## 2016-07-21 ENCOUNTER — Ambulatory Visit (INDEPENDENT_AMBULATORY_CARE_PROVIDER_SITE_OTHER): Payer: Medicare Other | Admitting: Family Medicine

## 2016-07-21 ENCOUNTER — Encounter: Payer: Self-pay | Admitting: Family Medicine

## 2016-07-21 VITALS — BP 121/80 | HR 78 | Temp 98.2°F | Resp 16 | Ht 68.0 in | Wt 129.2 lb

## 2016-07-21 DIAGNOSIS — R7881 Bacteremia: Secondary | ICD-10-CM | POA: Diagnosis not present

## 2016-07-21 DIAGNOSIS — T827XXD Infection and inflammatory reaction due to other cardiac and vascular devices, implants and grafts, subsequent encounter: Secondary | ICD-10-CM

## 2016-07-21 NOTE — Progress Notes (Signed)
Pre visit review using our clinic review tool, if applicable. No additional management support is needed unless otherwise documented below in the visit note. 

## 2016-07-21 NOTE — Patient Instructions (Signed)
Follow up as needed/scheduled Labs from last week look good! Restart the Coreg and the Losartan Try and get back to regular eating habits to regain your weight Call with any questions or concerns [place]!  (drive carefully!)

## 2016-07-21 NOTE — Assessment & Plan Note (Signed)
Pt has had this episode w/ PICC lines previously.  sxs stopped as soon as PICC was removed (fevers, sweats).  D/C summary and labs reviewed.  Pt had CBC and BMP drawn on Thursday- WNL.  Encouraged pt to increase her intake as she lost ~10 lbs from being off TPN x1 week.  Restart Losartan and Coreg.  Reviewed supportive care and red flags that should prompt return.  Pt expressed understanding and is in agreement w/ plan.

## 2016-07-21 NOTE — Progress Notes (Signed)
   Subjective:    Patient ID: [name] [name], female    DOB: 1950-05-18, 66 y.o.   MRN: [id number]  HPI Hospital f/u- pt was admitted 2/26-3/1 w/ Bacteremia (Klebsiella and MRSA).  Repeat blood cultures on 2/27 were negative.  Pt was d/c'd on Rocephin x11 days (which she has just finished).  D/c summary reviewed and they recommended repeat BMP/CBC (which was done on Thursday).  No longer having night sweats or fevers- this stopped w/ removal of old PICC.  No issues since placement of new PICC.  Pt having some redness and itching at bandage site.  No N/V since stopping Rocephin.  Pt's Coreg and Rocephin were held at time of d/c.   Review of Systems For ROS see HPI     Objective:   Physical Exam  Constitutional: She is oriented to person, place, and time. She appears well-developed and well-nourished. No distress.  HENT:  Head: Normocephalic and atraumatic.  Eyes: Conjunctivae and EOM are normal. Pupils are equal, round, and reactive to light.  Neck: Normal range of motion. Neck supple. No thyromegaly present.  Cardiovascular: Normal rate, regular rhythm, normal heart sounds and intact distal pulses.   No murmur heard. Pulmonary/Chest: Effort normal and breath sounds normal. No respiratory distress.  Abdominal: Soft. She exhibits no distension. There is no tenderness.  Musculoskeletal: She exhibits no edema.  Lymphadenopathy:    She has no cervical adenopathy.  Neurological: She is alert and oriented to person, place, and time.  Skin: Skin is warm and dry. There is erythema (mild erythema surrounding new PICC line).  Psychiatric: She has a normal mood and affect. Her behavior is normal.  Vitals reviewed.         Assessment & Plan:

## 2016-07-21 NOTE — Progress Notes (Signed)
Labs stable to slightly decreased - not surprised given recent line sepsis and time off TPN

## 2016-08-11 ENCOUNTER — Other Ambulatory Visit (INDEPENDENT_AMBULATORY_CARE_PROVIDER_SITE_OTHER): Payer: Medicare Other

## 2016-08-11 DIAGNOSIS — K912 Postsurgical malabsorption, not elsewhere classified: Secondary | ICD-10-CM | POA: Diagnosis not present

## 2016-08-11 DIAGNOSIS — K90829 Short bowel syndrome, unspecified: Secondary | ICD-10-CM

## 2016-08-11 DIAGNOSIS — K6389 Other specified diseases of intestine: Secondary | ICD-10-CM

## 2016-08-11 LAB — CBC WITH DIFFERENTIAL/PLATELET
BASOS PCT: 1.3 % (ref 0.0–3.0)
Basophils Absolute: 0.1 10*3/uL (ref 0.0–0.1)
EOS PCT: 6.9 % — AB (ref 0.0–5.0)
Eosinophils Absolute: 0.4 10*3/uL (ref 0.0–0.7)
HCT: 29.7 % — ABNORMAL LOW (ref 36.0–46.0)
HEMOGLOBIN: 9.6 g/dL — AB (ref 12.0–15.0)
Lymphocytes Relative: 49.7 % — ABNORMAL HIGH (ref 12.0–46.0)
Lymphs Abs: 2.7 10*3/uL (ref 0.7–4.0)
MCHC: 32.3 g/dL (ref 30.0–36.0)
MCV: 93.8 fl (ref 78.0–100.0)
MONOS PCT: 12 % (ref 3.0–12.0)
Monocytes Absolute: 0.7 10*3/uL (ref 0.1–1.0)
Neutro Abs: 1.7 10*3/uL (ref 1.4–7.7)
Neutrophils Relative %: 30.1 % — ABNORMAL LOW (ref 43.0–77.0)
Platelets: 123 10*3/uL — ABNORMAL LOW (ref 150.0–400.0)
RBC: 3.17 Mil/uL — AB (ref 3.87–5.11)
RDW: 15.6 % — ABNORMAL HIGH (ref 11.5–15.5)
WBC: 5.5 10*3/uL (ref 4.0–10.5)

## 2016-08-11 LAB — COMPREHENSIVE METABOLIC PANEL
ALBUMIN: 3.2 g/dL — AB (ref 3.5–5.2)
ALK PHOS: 71 U/L (ref 39–117)
ALT: 18 U/L (ref 0–35)
AST: 18 U/L (ref 0–37)
BUN: 15 mg/dL (ref 6–23)
CO2: 25 mEq/L (ref 19–32)
CREATININE: 1.03 mg/dL (ref 0.40–1.20)
Calcium: 8.8 mg/dL (ref 8.4–10.5)
Chloride: 108 mEq/L (ref 96–112)
GFR: 56.93 mL/min — ABNORMAL LOW (ref >60.00)
Glucose, Bld: 81 mg/dL (ref 70–99)
POTASSIUM: 4.1 meq/L (ref 3.5–5.1)
Sodium: 139 mEq/L (ref 135–145)
TOTAL PROTEIN: 7.2 g/dL (ref 6.0–8.3)
Total Bilirubin: 0.5 mg/dL (ref 0.2–1.2)

## 2016-08-11 LAB — TRIGLYCERIDES: Triglycerides: 135 mg/dL (ref 0.0–149.0)

## 2016-08-11 LAB — MAGNESIUM: Magnesium: 1.8 mg/dL (ref 1.5–2.5)

## 2016-08-11 LAB — BILIRUBIN, DIRECT: BILIRUBIN DIRECT: 0.1 mg/dL (ref 0.0–0.3)

## 2016-08-11 LAB — C-REACTIVE PROTEIN: CRP: 0.2 mg/dL — AB (ref 0.5–20.0)

## 2016-08-12 LAB — PREALBUMIN: Prealbumin: 16 mg/dL — ABNORMAL LOW (ref 17–34)

## 2016-08-12 NOTE — Progress Notes (Signed)
Stable labs

## 2016-09-08 ENCOUNTER — Telehealth: Payer: Self-pay | Admitting: Internal Medicine

## 2016-09-08 ENCOUNTER — Other Ambulatory Visit (INDEPENDENT_AMBULATORY_CARE_PROVIDER_SITE_OTHER): Payer: Medicare Other

## 2016-09-08 DIAGNOSIS — K6389 Other specified diseases of intestine: Secondary | ICD-10-CM

## 2016-09-08 DIAGNOSIS — K912 Postsurgical malabsorption, not elsewhere classified: Secondary | ICD-10-CM | POA: Diagnosis not present

## 2016-09-08 LAB — CBC WITH DIFFERENTIAL/PLATELET
BASOS ABS: 0.1 10*3/uL (ref 0.0–0.1)
Basophils Relative: 1.1 % (ref 0.0–3.0)
EOS ABS: 0.3 10*3/uL (ref 0.0–0.7)
Eosinophils Relative: 5.4 % — ABNORMAL HIGH (ref 0.0–5.0)
HEMATOCRIT: 33.2 % — AB (ref 36.0–46.0)
HEMOGLOBIN: 10.8 g/dL — AB (ref 12.0–15.0)
LYMPHS ABS: 2.8 10*3/uL (ref 0.7–4.0)
Lymphocytes Relative: 45.5 % (ref 12.0–46.0)
MCHC: 32.5 g/dL (ref 30.0–36.0)
MCV: 94.7 fl (ref 78.0–100.0)
MONO ABS: 0.6 10*3/uL (ref 0.1–1.0)
Monocytes Relative: 10.5 % (ref 3.0–12.0)
NEUTROS ABS: 2.3 10*3/uL (ref 1.4–7.7)
Neutrophils Relative %: 37.5 % — ABNORMAL LOW (ref 43.0–77.0)
Platelets: 146 10*3/uL — ABNORMAL LOW (ref 150.0–400.0)
RBC: 3.5 Mil/uL — ABNORMAL LOW (ref 3.87–5.11)
RDW: 16.4 % — AB (ref 11.5–15.5)
WBC: 6.2 10*3/uL (ref 4.0–10.5)

## 2016-09-08 LAB — BILIRUBIN, DIRECT: Bilirubin, Direct: 0.1 mg/dL (ref 0.0–0.3)

## 2016-09-08 LAB — COMPREHENSIVE METABOLIC PANEL
ALBUMIN: 3.1 g/dL — AB (ref 3.5–5.2)
ALT: 16 U/L (ref 0–35)
AST: 16 U/L (ref 0–37)
Alkaline Phosphatase: 82 U/L (ref 39–117)
BILIRUBIN TOTAL: 0.4 mg/dL (ref 0.2–1.2)
BUN: 14 mg/dL (ref 6–23)
CALCIUM: 8.6 mg/dL (ref 8.4–10.5)
CO2: 24 mEq/L (ref 19–32)
CREATININE: 0.85 mg/dL (ref 0.40–1.20)
Chloride: 108 mEq/L (ref 96–112)
GFR: 71.04 mL/min (ref >60.00)
Glucose, Bld: 74 mg/dL (ref 70–99)
Potassium: 3.6 mEq/L (ref 3.5–5.1)
Sodium: 139 mEq/L (ref 135–145)
Total Protein: 7 g/dL (ref 6.0–8.3)

## 2016-09-08 LAB — TRIGLYCERIDES: Triglycerides: 174 mg/dL — ABNORMAL HIGH (ref 0.0–149.0)

## 2016-09-08 LAB — MAGNESIUM: MAGNESIUM: 1.7 mg/dL (ref 1.5–2.5)

## 2016-09-08 LAB — C-REACTIVE PROTEIN: CRP: 0.1 mg/dL — ABNORMAL LOW (ref 0.5–20.0)

## 2016-09-08 NOTE — Telephone Encounter (Signed)
I spoke with Ricky at [facility] and they are going to re-fax the form to get a hand written signature on tomorrow when Dr Carlean Purl is in.

## 2016-09-09 ENCOUNTER — Telehealth: Payer: Self-pay | Admitting: Internal Medicine

## 2016-09-09 LAB — PREALBUMIN: PREALBUMIN: 17 mg/dL (ref 17–34)

## 2016-09-09 NOTE — Telephone Encounter (Signed)
Called [facility] and told them we have not received a fax yet for Dr Carlean Purl to sign.  They are going to re-fax.

## 2016-09-09 NOTE — Telephone Encounter (Signed)
Received fax and will have Dr Carlean Purl sign and I'll fax it back to [phone].

## 2016-09-11 NOTE — Progress Notes (Signed)
Labs ok - normal or stable mild abnl

## 2016-09-18 ENCOUNTER — Ambulatory Visit (INDEPENDENT_AMBULATORY_CARE_PROVIDER_SITE_OTHER): Payer: Medicare Other | Admitting: Internal Medicine

## 2016-09-18 ENCOUNTER — Encounter: Payer: Self-pay | Admitting: Internal Medicine

## 2016-09-18 VITALS — BP 100/60 | HR 82 | Ht 68.0 in | Wt 131.0 lb

## 2016-09-18 DIAGNOSIS — K912 Postsurgical malabsorption, not elsewhere classified: Secondary | ICD-10-CM | POA: Diagnosis not present

## 2016-09-18 DIAGNOSIS — E441 Mild protein-calorie malnutrition: Secondary | ICD-10-CM

## 2016-09-18 DIAGNOSIS — R634 Abnormal weight loss: Secondary | ICD-10-CM | POA: Diagnosis not present

## 2016-09-18 NOTE — Assessment & Plan Note (Signed)
TPN 4/7 Go to 5 PO4 level

## 2016-09-18 NOTE — Assessment & Plan Note (Signed)
Increase TPN

## 2016-09-18 NOTE — Patient Instructions (Signed)
  Follow up with Dr Carlean Purl in September.   We are going to work on getting your TPN increased to 5 nights.   I appreciate the opportunity to care for you. Silvano Rusk, MD, [facility]

## 2016-09-18 NOTE — Progress Notes (Signed)
   [name] [name] 65 y.o. July 02, 1950 [id number]  Assessment & Plan:   Encounter Diagnoses  Name Primary?  Marland Kitchen Acquired short bowel syndrome   . Mild malnutrition ([place])   . Loss of weight Yes   Acquired short bowel syndrome TPN 4/7 Go to 5 PO4 level  Mild malnutrition (HCC) Increase TPN   Will increase TPN to 5 days out of the week to try to help her gain some weight. She has my cell number now to call me as needed to add input when and if she gets hospitalized again.  I appreciate the opportunity to care for this patient. CC: [name], MD  Subjective:   Chief Complaint:Follow-up short bowel syndrome  HPI Kharis is here, she was hospitalized in the past couple of months with another line infection. That has resolved. The hospitalist and some of the staff did not listen to her about her condition is much as she would've liked, we reviewed that and I think she was appropriately concerned. She lost weight with that illness and is concerned that she hasn't gained that back yet. She is asking about increasing her TPN to 5 days. Otherwise she appears stable and is leading a very active and functional life. She has her usual positive outlook on life overall. Her granddaughter is transferring to [employer] and is gone to study [employer] from Last 3 Encounters:  09/18/16 131 lb (59.4 kg)  07/21/16 129 lb 4 oz (58.6 kg)  07/07/16 137 lb 9.6 oz (62.4 kg)   Medications, allergies, past medical history, past surgical history, family history and social history are reviewed and updated in the EMR.  Review of Systems As above  Objective:   Physical Exam BP 100/60   Pulse 82   Ht 5\' 8"  (1.727 m)   Wt 131 lb (59.4 kg)   LMP 02/26/2014   BMI 19.92 kg/m  Mildly chronically ill Pleasant no acute distress Eyes are anicteric Mouth shows dentures Chest wall there is a venous catheter in the left anterior upper chest intact no signs of  inflammation Lungs clear R S1-S2 no rubs murmurs or gallops Abdomen is thin soft and nontender with some right lower quadrant scar tissue palpable was in the past. Nontender.  I reviewed regular recent labs. Hospital records today.

## 2016-09-19 ENCOUNTER — Encounter: Payer: Self-pay | Admitting: Internal Medicine

## 2016-09-22 ENCOUNTER — Other Ambulatory Visit: Payer: Self-pay

## 2016-09-22 DIAGNOSIS — K912 Postsurgical malabsorption, not elsewhere classified: Secondary | ICD-10-CM

## 2016-10-07 ENCOUNTER — Other Ambulatory Visit (INDEPENDENT_AMBULATORY_CARE_PROVIDER_SITE_OTHER): Payer: Medicare Other

## 2016-10-07 DIAGNOSIS — K6389 Other specified diseases of intestine: Secondary | ICD-10-CM | POA: Diagnosis not present

## 2016-10-07 DIAGNOSIS — K912 Postsurgical malabsorption, not elsewhere classified: Secondary | ICD-10-CM

## 2016-10-07 LAB — CBC WITH DIFFERENTIAL/PLATELET
Basophils Absolute: 0 10*3/uL (ref 0.0–0.1)
Basophils Relative: 0.7 % (ref 0.0–3.0)
EOS ABS: 0.2 10*3/uL (ref 0.0–0.7)
Eosinophils Relative: 3.2 % (ref 0.0–5.0)
HCT: 34.1 % — ABNORMAL LOW (ref 36.0–46.0)
HEMOGLOBIN: 11.1 g/dL — AB (ref 12.0–15.0)
LYMPHS ABS: 3 10*3/uL (ref 0.7–4.0)
Lymphocytes Relative: 41.4 % (ref 12.0–46.0)
MCHC: 32.5 g/dL (ref 30.0–36.0)
MCV: 94 fl (ref 78.0–100.0)
MONO ABS: 0.8 10*3/uL (ref 0.1–1.0)
Monocytes Relative: 11.1 % (ref 3.0–12.0)
NEUTROS PCT: 43.6 % (ref 43.0–77.0)
Neutro Abs: 3.2 10*3/uL (ref 1.4–7.7)
Platelets: 135 10*3/uL — ABNORMAL LOW (ref 150.0–400.0)
RBC: 3.63 Mil/uL — AB (ref 3.87–5.11)
RDW: 16.2 % — AB (ref 11.5–15.5)
WBC: 7.3 10*3/uL (ref 4.0–10.5)

## 2016-10-07 LAB — COMPREHENSIVE METABOLIC PANEL
ALBUMIN: 3.3 g/dL — AB (ref 3.5–5.2)
ALT: 20 U/L (ref 0–35)
AST: 16 U/L (ref 0–37)
Alkaline Phosphatase: 73 U/L (ref 39–117)
BUN: 14 mg/dL (ref 6–23)
CHLORIDE: 112 meq/L (ref 96–112)
CO2: 22 mEq/L (ref 19–32)
Calcium: 8.9 mg/dL (ref 8.4–10.5)
Creatinine, Ser: 0.83 mg/dL (ref 0.40–1.20)
GFR: 73.01 mL/min (ref >60.00)
GLUCOSE: 88 mg/dL (ref 70–99)
POTASSIUM: 3.9 meq/L (ref 3.5–5.1)
SODIUM: 140 meq/L (ref 135–145)
TOTAL PROTEIN: 7.3 g/dL (ref 6.0–8.3)
Total Bilirubin: 0.4 mg/dL (ref 0.2–1.2)

## 2016-10-07 LAB — PHOSPHORUS: PHOSPHORUS: 3.4 mg/dL (ref 2.3–4.6)

## 2016-10-07 LAB — BILIRUBIN, DIRECT: BILIRUBIN DIRECT: 0.1 mg/dL (ref 0.0–0.3)

## 2016-10-07 LAB — MAGNESIUM: Magnesium: 1.8 mg/dL (ref 1.5–2.5)

## 2016-10-07 LAB — TRIGLYCERIDES: Triglycerides: 99 mg/dL (ref 0.0–149.0)

## 2016-10-08 LAB — C-REACTIVE PROTEIN: CRP: 0.9 mg/dL (ref 0.5–20.0)

## 2016-10-09 LAB — PREALBUMIN: Prealbumin: 15 mg/dL — ABNORMAL LOW (ref 17–34)

## 2016-10-13 NOTE — Progress Notes (Signed)
Labs ok/stable.

## 2016-11-05 ENCOUNTER — Other Ambulatory Visit (INDEPENDENT_AMBULATORY_CARE_PROVIDER_SITE_OTHER): Payer: Medicare Other

## 2016-11-05 DIAGNOSIS — K912 Postsurgical malabsorption, not elsewhere classified: Secondary | ICD-10-CM

## 2016-11-05 DIAGNOSIS — K6389 Other specified diseases of intestine: Secondary | ICD-10-CM

## 2016-11-05 LAB — COMPREHENSIVE METABOLIC PANEL
ALK PHOS: 64 U/L (ref 39–117)
ALT: 19 U/L (ref 0–35)
AST: 21 U/L (ref 0–37)
Albumin: 3.1 g/dL — ABNORMAL LOW (ref 3.5–5.2)
BUN: 19 mg/dL (ref 6–23)
CHLORIDE: 111 meq/L (ref 96–112)
CO2: 26 mEq/L (ref 19–32)
Calcium: 8.7 mg/dL (ref 8.4–10.5)
Creatinine, Ser: 0.82 mg/dL (ref 0.40–1.20)
GFR: 74.02 mL/min (ref >60.00)
GLUCOSE: 96 mg/dL (ref 70–99)
POTASSIUM: 4.4 meq/L (ref 3.5–5.1)
SODIUM: 142 meq/L (ref 135–145)
TOTAL PROTEIN: 6.4 g/dL (ref 6.0–8.3)
Total Bilirubin: 0.3 mg/dL (ref 0.2–1.2)

## 2016-11-05 LAB — CBC WITH DIFFERENTIAL/PLATELET
Basophils Absolute: 0.1 10*3/uL (ref 0.0–0.1)
Basophils Relative: 0.8 % (ref 0.0–3.0)
EOS ABS: 0.5 10*3/uL (ref 0.0–0.7)
EOS PCT: 6.1 % — AB (ref 0.0–5.0)
HCT: 30.4 % — ABNORMAL LOW (ref 36.0–46.0)
Hemoglobin: 10 g/dL — ABNORMAL LOW (ref 12.0–15.0)
LYMPHS ABS: 2.8 10*3/uL (ref 0.7–4.0)
Lymphocytes Relative: 34.9 % (ref 12.0–46.0)
MCHC: 32.9 g/dL (ref 30.0–36.0)
MCV: 96.8 fl (ref 78.0–100.0)
MONO ABS: 0.8 10*3/uL (ref 0.1–1.0)
Monocytes Relative: 10.1 % (ref 3.0–12.0)
NEUTROS PCT: 48.1 % (ref 43.0–77.0)
Neutro Abs: 3.9 10*3/uL (ref 1.4–7.7)
Platelets: 127 10*3/uL — ABNORMAL LOW (ref 150.0–400.0)
RBC: 3.14 Mil/uL — AB (ref 3.87–5.11)
RDW: 16 % — ABNORMAL HIGH (ref 11.5–15.5)
WBC: 8.1 10*3/uL (ref 4.0–10.5)

## 2016-11-05 LAB — PHOSPHORUS: PHOSPHORUS: 4.1 mg/dL (ref 2.3–4.6)

## 2016-11-05 LAB — MAGNESIUM: Magnesium: 1.9 mg/dL (ref 1.5–2.5)

## 2016-11-05 LAB — C-REACTIVE PROTEIN: CRP: 0 mg/dL — AB (ref 0.5–20.0)

## 2016-11-05 LAB — TRIGLYCERIDES: Triglycerides: 58 mg/dL (ref 0.0–149.0)

## 2016-11-05 LAB — BILIRUBIN, DIRECT: Bilirubin, Direct: 0.1 mg/dL (ref 0.0–0.3)

## 2016-11-07 LAB — PREALBUMIN: Prealbumin: 17 mg/dL (ref 17–34)

## 2016-11-07 NOTE — Progress Notes (Signed)
Labs stable abnormal or OK

## 2016-11-24 ENCOUNTER — Encounter: Payer: Self-pay | Admitting: *Deleted

## 2016-12-03 ENCOUNTER — Other Ambulatory Visit: Payer: Self-pay

## 2016-12-03 ENCOUNTER — Ambulatory Visit (HOSPITAL_COMMUNITY): Payer: Medicare Other | Attending: Internal Medicine

## 2016-12-03 DIAGNOSIS — I429 Cardiomyopathy, unspecified: Secondary | ICD-10-CM | POA: Insufficient documentation

## 2016-12-03 DIAGNOSIS — I5022 Chronic systolic (congestive) heart failure: Secondary | ICD-10-CM | POA: Insufficient documentation

## 2016-12-03 NOTE — Progress Notes (Unsigned)
Patient did not give consent for use of Definity at this time.

## 2016-12-06 NOTE — Progress Notes (Signed)
Patient ID: [name] [name], female   DOB: [birth year], 66 y.o.   MRN: [id number]   Cardiology Office Note    Date:  12/08/2016   ID:  [name] [name], DOB Aug 21, 1950, MRN [id number]  PCP:  [name], MD  Cardiologist:  Dr. Jenkins Rouge      History of Present Illness: [name] [name] is a 66 y.o. 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   She is followed here by Dr. Carlean Purl and at [facility] with Dr. Clementeen Graham (has been placed on Gattex).  Echo 5/15  Improved EF Study Conclusions  - Left ventricle: Inferior and apical hypokinesis. The cavity size was mildly dilated. Wall thickness was normal. The estimated ejection fraction was 45%. - Aortic valve: Valve area (VTI): 2.28 cm^2. Valve area (Vmax): 2.4 cm^2. - Mitral valve: There was mild regurgitation. - Atrial septum: No defect or patent foramen ovale was identified.  ------------------------------------------------------------------- Labs, prior tests, procedures, and surgery: Echocardiography (August 2013).   EF was 30%.  Myovue 9/15 with old inferior apical scar no ischemia  EF  47%   No further w/u deemed necessary by PA at time    Her imdur and coreg had been stopped  The former not thought to be doing any good and latter as she had first degree block and resting HR in 60's   She feels great TPN 3x/week and iv fluid 1x/week Has been off anticoagulation for 3 years and it is not clear to me why   Admission for  ecoli bacteremia 05/2015  Antibiotics via PIC line  Which was replaced  Carotid duplex 07/19/15 40-59% RICA stenosis needs to be updated    Echo 12/03/16 reviewed EF 40-45% AV sclerosis mean gradient 6  mmHg  Started on ARB and coreg:  Right knee with some pain for a month   Recent Labs: 11/05/2016: ALT 19; Creatinine, Ser 0.82; Hemoglobin 10.0; Potassium 4.4  Wt Readings from Last 3 Encounters:  12/08/16 136 lb (61.7 kg)  09/18/16 131 lb (59.4 kg)  07/21/16 129 lb 4 oz (58.6 kg)     Past Medical History:  Diagnosis Date  . Abnormal LFTs 2013  . Allergic rhinosinusitis   . Anemia of chronic disease   . At risk for dental problems   . Atypical nevus   . Bacteremia due to Klebsiella pneumoniae 12/12/2011  . Bacterial overgrowth syndrome   . 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 Carotid stenosis    Carotid US (9/15):  R 40-59%; L 1-39% >> FU 1 year  . Congestive heart failure (CHF) (HCC)    EF 25-30%  . Fracture of left clavicle   . History of blood transfusion 2013   anemia  . Hx of cardiovascular stress test    Myoview (9/15):  inf-apical scar; no ischemia; EF 47% - low risk   . 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 Osteoporosis   . Pancytopenia 10/07/2011  . Pathologic fracture of neck of femur ([place])   . Personal history of colonic polyps   . Renal insufficiency    hx of yrs ago  . Serratia marcescens infection - bactermia assoc w/ PICC 01/18/2015  . 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 Splenomegaly    By ultrasound  . Thrombophilia ([place])   . Vitamin D deficiency     Current Outpatient Prescriptions  Medication Sig Dispense Refill  . ADULT TPN 1,800 mLs 4 (four) times a week. Pt receives home TPN from [facility]:  1800 mL bag, four nights weekly (Monday, Wednesday, Thursday, and Friday) for 8 hours (includes 1 hour taper up and down).    Marland Kitchen aspirin EC 81 MG tablet Take 1 tablet (81 mg total) by mouth daily.    . cholecalciferol (VITAMIN D) 1000 units tablet Take 1,000 Units by mouth daily.    . diphenhydrAMINE (BENADRYL) 25 mg capsule Take 25-50 mg by mouth every 6 (six) hours as needed for itching or sleep.      . diphenoxylate-atropine (LOMOTIL) 2.5-0.025 MG tablet Take 1 tablet by mouth 4 (four) times daily as needed for diarrhea or loose stools. 90 tablet 5  . ibuprofen (ADVIL,MOTRIN) 200 MG tablet Take 400 mg by mouth every 6 (six) hours as needed for headache, mild pain or moderate pain.    . Multiple Vitamin (MULTIVITAMIN WITH MINERALS) TABS Take 1 tablet by mouth daily.    Marland Kitchen omeprazole (PRILOSEC) 40 MG capsule Take 1 capsule (40 mg total) by mouth 2 (two) times daily. Take before breakfast and supper 60 capsule 11  . ondansetron (ZOFRAN-ODT) 8 MG disintegrating tablet     . Teduglutide, rDNA, (GATTEX) 5 MG KIT Inject 3.3 Units into the skin daily. 1 kit 11  . vitamin E 400 UNIT capsule Take 400 Units by mouth daily.     . carvedilol (COREG) 3.125 MG tablet      No current facility-administered medications for this visit.     Allergies:   Iohexol and Penicillins   Social History:  The patient  reports that she quit smoking about 12 years ago. Her smoking use included Cigarettes. She has never used smokeless tobacco. She reports that she does not drink alcohol or use drugs.   Family History:  The patient's family history includes Diabetes in her mother; Hypertension in her mother.   ROS:  Please see the history of present illness.   She has chronic diarrhea.   All other systems reviewed and negative.   PHYSICAL EXAM: VS:  BP 118/68   Pulse (!) 59   Ht _0  (1.727 m)   Wt 136 lb (61.7 kg)   LMP 02/26/2014   SpO2 96%   BMI 20.68 kg/m  Affect appropriate Healthy:  appears stated age 45: normal Neck supple with no adenopathy JVP normal right  bruits no thyromegaly Lungs clear with no wheezing and good diaphragmatic motion Heart:  S1/S2 SEM  murmur, no rub, gallop or click PMI normal Abdomen: benighn, BS positve, no tenderness, no AAA no bruit.  No HSM or HJR Distal pulses intact with no bruits No edema Neuro non-focal Skin warm and dry No muscular weakness Left tunneled  subclavian line   EKG:   11/17/13   NSR, HR 64, normal axis, IVCD, poor R wave progression, anterolateral T wave inversions, first degree block with a PR interval of 222    06/15/15  SR rate 67  LBBB PR 212  05/22/16  SR rate 63 LBBB   Assessment and Plan:  CHF: Functional class one does not want heart cath. No symptoms active walking tolerating ARB and beta blockerEF improved 40-45% July 2018  Continue medical Rx  Short Bowel Syndrome stable getting TPN every  4 days new PIC line looks good  Bacteremia resolved off antibiotics no further chills  Carotid f40-59% RICA stenosis f.u duplex past due will order   Othro: right knee pain NSAI's f/u Dr Theda Sers who sees her husband   Murmur:  AV sclerosis small gradient on recent echo observe  Jenkins Rouge

## 2016-12-08 ENCOUNTER — Ambulatory Visit (INDEPENDENT_AMBULATORY_CARE_PROVIDER_SITE_OTHER): Payer: Medicare Other | Admitting: Cardiovascular Disease

## 2016-12-08 ENCOUNTER — Other Ambulatory Visit (INDEPENDENT_AMBULATORY_CARE_PROVIDER_SITE_OTHER): Payer: Medicare Other

## 2016-12-08 VITALS — BP 118/68 | HR 59 | Ht 68.0 in | Wt 136.0 lb

## 2016-12-08 DIAGNOSIS — K912 Postsurgical malabsorption, not elsewhere classified: Secondary | ICD-10-CM

## 2016-12-08 DIAGNOSIS — K6389 Other specified diseases of intestine: Secondary | ICD-10-CM

## 2016-12-08 DIAGNOSIS — I779 Disorder of arteries and arterioles, unspecified: Secondary | ICD-10-CM | POA: Diagnosis not present

## 2016-12-08 DIAGNOSIS — I739 Peripheral vascular disease, unspecified: Secondary | ICD-10-CM

## 2016-12-08 DIAGNOSIS — R0989 Other specified symptoms and signs involving the circulatory and respiratory systems: Secondary | ICD-10-CM | POA: Diagnosis not present

## 2016-12-08 DIAGNOSIS — K90829 Short bowel syndrome, unspecified: Secondary | ICD-10-CM

## 2016-12-08 LAB — CBC WITH DIFFERENTIAL/PLATELET
BASOS ABS: 0.1 10*3/uL (ref 0.0–0.1)
BASOS PCT: 0.9 % (ref 0.0–3.0)
EOS ABS: 0.4 10*3/uL (ref 0.0–0.7)
Eosinophils Relative: 6 % — ABNORMAL HIGH (ref 0.0–5.0)
HCT: 33.9 % — ABNORMAL LOW (ref 36.0–46.0)
Hemoglobin: 10.9 g/dL — ABNORMAL LOW (ref 12.0–15.0)
LYMPHS PCT: 43.1 % (ref 12.0–46.0)
Lymphs Abs: 2.6 10*3/uL (ref 0.7–4.0)
MCHC: 32.1 g/dL (ref 30.0–36.0)
MCV: 100.4 fl — ABNORMAL HIGH (ref 78.0–100.0)
MONO ABS: 0.7 10*3/uL (ref 0.1–1.0)
Monocytes Relative: 11.5 % (ref 3.0–12.0)
NEUTROS ABS: 2.3 10*3/uL (ref 1.4–7.7)
NEUTROS PCT: 38.5 % — AB (ref 43.0–77.0)
PLATELETS: 142 10*3/uL — AB (ref 150.0–400.0)
RBC: 3.38 Mil/uL — ABNORMAL LOW (ref 3.87–5.11)
RDW: 14.7 % (ref 11.5–15.5)
WBC: 6 10*3/uL (ref 4.0–10.5)

## 2016-12-08 LAB — COMPREHENSIVE METABOLIC PANEL
ALK PHOS: 76 U/L (ref 39–117)
ALT: 19 U/L (ref 0–35)
AST: 18 U/L (ref 0–37)
Albumin: 3.4 g/dL — ABNORMAL LOW (ref 3.5–5.2)
BUN: 14 mg/dL (ref 6–23)
CALCIUM: 8.9 mg/dL (ref 8.4–10.5)
CO2: 22 meq/L (ref 19–32)
Chloride: 110 mEq/L (ref 96–112)
Creatinine, Ser: 0.97 mg/dL (ref 0.40–1.20)
GFR: 60.96 mL/min (ref >60.00)
GLUCOSE: 89 mg/dL (ref 70–99)
POTASSIUM: 4.1 meq/L (ref 3.5–5.1)
Sodium: 140 mEq/L (ref 135–145)
Total Bilirubin: 0.5 mg/dL (ref 0.2–1.2)
Total Protein: 7.5 g/dL (ref 6.0–8.3)

## 2016-12-08 LAB — C-REACTIVE PROTEIN: CRP: 0.1 mg/dL — AB (ref 0.5–20.0)

## 2016-12-08 LAB — TRIGLYCERIDES: Triglycerides: 160 mg/dL — ABNORMAL HIGH (ref 0.0–149.0)

## 2016-12-08 LAB — BILIRUBIN, DIRECT: Bilirubin, Direct: 0.2 mg/dL (ref 0.0–0.3)

## 2016-12-08 LAB — MAGNESIUM: MAGNESIUM: 1.8 mg/dL (ref 1.5–2.5)

## 2016-12-08 LAB — PHOSPHORUS: Phosphorus: 4 mg/dL (ref 2.3–4.6)

## 2016-12-08 NOTE — Patient Instructions (Signed)
Medication Instructions:  Your physician recommends that you continue on your current medications as directed. Please refer to the Current Medication list given to you today.  Labwork: NONE  Testing/Procedures: Your physician has requested that you have a carotid duplex. This test is an ultrasound of the carotid arteries in your neck. It looks at blood flow through these arteries that supply the brain with blood. Allow one hour for this exam. There are no restrictions or special instructions.  Follow-Up: Your physician wants you to follow-up in: 12 months with Dr. Nishan. You will receive a reminder letter in the mail two months in advance. If you don't receive a letter, please call our office to schedule the follow-up appointment.   If you need a refill on your cardiac medications before your next appointment, please call your pharmacy.    

## 2016-12-09 LAB — PREALBUMIN: PREALBUMIN: 19 mg/dL (ref 17–34)

## 2016-12-12 NOTE — Progress Notes (Signed)
Labs ok  My Chart

## 2017-01-06 ENCOUNTER — Other Ambulatory Visit (INDEPENDENT_AMBULATORY_CARE_PROVIDER_SITE_OTHER): Payer: Medicare Other

## 2017-01-06 DIAGNOSIS — K6389 Other specified diseases of intestine: Secondary | ICD-10-CM

## 2017-01-06 DIAGNOSIS — K912 Postsurgical malabsorption, not elsewhere classified: Secondary | ICD-10-CM | POA: Diagnosis not present

## 2017-01-06 LAB — CBC WITH DIFFERENTIAL/PLATELET
BASOS ABS: 0.1 10*3/uL (ref 0.0–0.1)
Basophils Relative: 1 % (ref 0.0–3.0)
EOS ABS: 0.3 10*3/uL (ref 0.0–0.7)
Eosinophils Relative: 3.7 % (ref 0.0–5.0)
HEMATOCRIT: 33.2 % — AB (ref 36.0–46.0)
HEMOGLOBIN: 10.9 g/dL — AB (ref 12.0–15.0)
Lymphocytes Relative: 31.2 % (ref 12.0–46.0)
Lymphs Abs: 2.5 10*3/uL (ref 0.7–4.0)
MCHC: 32.9 g/dL (ref 30.0–36.0)
MCV: 100.2 fl — AB (ref 78.0–100.0)
MONO ABS: 0.8 10*3/uL (ref 0.1–1.0)
Monocytes Relative: 10.4 % (ref 3.0–12.0)
Neutro Abs: 4.3 10*3/uL (ref 1.4–7.7)
Neutrophils Relative %: 53.7 % (ref 43.0–77.0)
PLATELETS: 136 10*3/uL — AB (ref 150.0–400.0)
RBC: 3.32 Mil/uL — AB (ref 3.87–5.11)
RDW: 14.1 % (ref 11.5–15.5)
WBC: 8 10*3/uL (ref 4.0–10.5)

## 2017-01-06 LAB — MAGNESIUM: MAGNESIUM: 1.8 mg/dL (ref 1.5–2.5)

## 2017-01-06 LAB — COMPREHENSIVE METABOLIC PANEL
ALBUMIN: 3.4 g/dL — AB (ref 3.5–5.2)
ALK PHOS: 63 U/L (ref 39–117)
ALT: 19 U/L (ref 0–35)
AST: 16 U/L (ref 0–37)
BILIRUBIN TOTAL: 0.4 mg/dL (ref 0.2–1.2)
BUN: 19 mg/dL (ref 6–23)
CALCIUM: 8.9 mg/dL (ref 8.4–10.5)
CO2: 24 meq/L (ref 19–32)
CREATININE: 0.82 mg/dL (ref 0.40–1.20)
Chloride: 110 mEq/L (ref 96–112)
GFR: 73.98 mL/min (ref >60.00)
Glucose, Bld: 102 mg/dL — ABNORMAL HIGH (ref 70–99)
Potassium: 3.8 mEq/L (ref 3.5–5.1)
Sodium: 140 mEq/L (ref 135–145)
TOTAL PROTEIN: 7.2 g/dL (ref 6.0–8.3)

## 2017-01-06 LAB — BILIRUBIN, DIRECT: Bilirubin, Direct: 0.2 mg/dL (ref 0.0–0.3)

## 2017-01-06 LAB — PHOSPHORUS: PHOSPHORUS: 3.8 mg/dL (ref 2.3–4.6)

## 2017-01-06 LAB — TRIGLYCERIDES: Triglycerides: 94 mg/dL (ref 0.0–149.0)

## 2017-01-06 LAB — C-REACTIVE PROTEIN

## 2017-01-07 LAB — PREALBUMIN: Prealbumin: 20 mg/dL (ref 17–34)

## 2017-01-07 NOTE — Progress Notes (Signed)
Labs either stable mildly abnormal or normal my chart message

## 2017-02-02 ENCOUNTER — Other Ambulatory Visit (INDEPENDENT_AMBULATORY_CARE_PROVIDER_SITE_OTHER): Payer: Medicare Other

## 2017-02-02 DIAGNOSIS — K6389 Other specified diseases of intestine: Secondary | ICD-10-CM

## 2017-02-02 DIAGNOSIS — K912 Postsurgical malabsorption, not elsewhere classified: Secondary | ICD-10-CM

## 2017-02-02 LAB — CBC WITH DIFFERENTIAL/PLATELET
BASOS ABS: 0 10*3/uL (ref 0.0–0.1)
Basophils Relative: 0.8 % (ref 0.0–3.0)
EOS ABS: 0.5 10*3/uL (ref 0.0–0.7)
Eosinophils Relative: 8.6 % — ABNORMAL HIGH (ref 0.0–5.0)
HCT: 34.7 % — ABNORMAL LOW (ref 36.0–46.0)
Hemoglobin: 11.2 g/dL — ABNORMAL LOW (ref 12.0–15.0)
LYMPHS ABS: 2.4 10*3/uL (ref 0.7–4.0)
Lymphocytes Relative: 39.4 % (ref 12.0–46.0)
MCHC: 32.1 g/dL (ref 30.0–36.0)
MCV: 100.4 fl — ABNORMAL HIGH (ref 78.0–100.0)
MONO ABS: 0.7 10*3/uL (ref 0.1–1.0)
Monocytes Relative: 12.1 % — ABNORMAL HIGH (ref 3.0–12.0)
NEUTROS ABS: 2.4 10*3/uL (ref 1.4–7.7)
NEUTROS PCT: 39.1 % — AB (ref 43.0–77.0)
PLATELETS: 122 10*3/uL — AB (ref 150.0–400.0)
RBC: 3.45 Mil/uL — AB (ref 3.87–5.11)
RDW: 14.1 % (ref 11.5–15.5)
WBC: 6.1 10*3/uL (ref 4.0–10.5)

## 2017-02-02 LAB — COMPREHENSIVE METABOLIC PANEL
ALBUMIN: 3.5 g/dL (ref 3.5–5.2)
ALT: 18 U/L (ref 0–35)
AST: 17 U/L (ref 0–37)
Alkaline Phosphatase: 72 U/L (ref 39–117)
BILIRUBIN TOTAL: 0.5 mg/dL (ref 0.2–1.2)
BUN: 15 mg/dL (ref 6–23)
CALCIUM: 8.8 mg/dL (ref 8.4–10.5)
CO2: 19 mEq/L (ref 19–32)
CREATININE: 0.95 mg/dL (ref 0.40–1.20)
Chloride: 110 mEq/L (ref 96–112)
GFR: 62.41 mL/min (ref >60.00)
Glucose, Bld: 88 mg/dL (ref 70–99)
Potassium: 3.4 mEq/L — ABNORMAL LOW (ref 3.5–5.1)
Sodium: 139 mEq/L (ref 135–145)
TOTAL PROTEIN: 7.6 g/dL (ref 6.0–8.3)

## 2017-02-02 LAB — TRIGLYCERIDES: Triglycerides: 172 mg/dL — ABNORMAL HIGH (ref 0.0–149.0)

## 2017-02-02 LAB — BILIRUBIN, DIRECT: BILIRUBIN DIRECT: 0.1 mg/dL (ref 0.0–0.3)

## 2017-02-02 LAB — PHOSPHORUS: Phosphorus: 3.8 mg/dL (ref 2.3–4.6)

## 2017-02-02 LAB — MAGNESIUM: MAGNESIUM: 1.8 mg/dL (ref 1.5–2.5)

## 2017-02-02 LAB — C-REACTIVE PROTEIN: CRP: 0.2 mg/dL — ABNORMAL LOW (ref 0.5–20.0)

## 2017-02-03 LAB — PREALBUMIN: PREALBUMIN: 19 mg/dL (ref 17–34)

## 2017-02-08 NOTE — Progress Notes (Signed)
My Chart note stable labs

## 2017-02-12 ENCOUNTER — Ambulatory Visit (INDEPENDENT_AMBULATORY_CARE_PROVIDER_SITE_OTHER): Payer: Medicare Other

## 2017-02-12 DIAGNOSIS — Z23 Encounter for immunization: Secondary | ICD-10-CM

## 2017-02-16 ENCOUNTER — Inpatient Hospital Stay (HOSPITAL_COMMUNITY): Admission: RE | Admit: 2017-02-16 | Payer: Medicare Other | Source: Ambulatory Visit

## 2017-03-03 ENCOUNTER — Ambulatory Visit (HOSPITAL_COMMUNITY)
Admission: RE | Admit: 2017-03-03 | Payer: Medicare Other | Source: Ambulatory Visit | Attending: Cardiovascular Disease | Admitting: Cardiovascular Disease

## 2017-03-09 ENCOUNTER — Other Ambulatory Visit (INDEPENDENT_AMBULATORY_CARE_PROVIDER_SITE_OTHER): Payer: Medicare Other

## 2017-03-09 DIAGNOSIS — K912 Postsurgical malabsorption, not elsewhere classified: Secondary | ICD-10-CM

## 2017-03-09 DIAGNOSIS — K6389 Other specified diseases of intestine: Secondary | ICD-10-CM | POA: Diagnosis not present

## 2017-03-09 LAB — CBC WITH DIFFERENTIAL/PLATELET
BASOS PCT: 1.1 % (ref 0.0–3.0)
Basophils Absolute: 0.1 10*3/uL (ref 0.0–0.1)
EOS PCT: 7.6 % — AB (ref 0.0–5.0)
Eosinophils Absolute: 0.4 10*3/uL (ref 0.0–0.7)
HCT: 35.6 % — ABNORMAL LOW (ref 36.0–46.0)
HEMOGLOBIN: 11.4 g/dL — AB (ref 12.0–15.0)
LYMPHS ABS: 2.4 10*3/uL (ref 0.7–4.0)
Lymphocytes Relative: 42 % (ref 12.0–46.0)
MCHC: 32.1 g/dL (ref 30.0–36.0)
MCV: 99.2 fl (ref 78.0–100.0)
MONO ABS: 0.7 10*3/uL (ref 0.1–1.0)
MONOS PCT: 11.8 % (ref 3.0–12.0)
Neutro Abs: 2.1 10*3/uL (ref 1.4–7.7)
Neutrophils Relative %: 37.5 % — ABNORMAL LOW (ref 43.0–77.0)
Platelets: 128 10*3/uL — ABNORMAL LOW (ref 150.0–400.0)
RBC: 3.59 Mil/uL — AB (ref 3.87–5.11)
RDW: 13.7 % (ref 11.5–15.5)
WBC: 5.6 10*3/uL (ref 4.0–10.5)

## 2017-03-09 LAB — TRIGLYCERIDES: TRIGLYCERIDES: 151 mg/dL — AB (ref 0.0–149.0)

## 2017-03-09 LAB — BILIRUBIN, DIRECT: BILIRUBIN DIRECT: 0.2 mg/dL (ref 0.0–0.3)

## 2017-03-09 LAB — COMPREHENSIVE METABOLIC PANEL
ALBUMIN: 3.4 g/dL — AB (ref 3.5–5.2)
ALK PHOS: 73 U/L (ref 39–117)
ALT: 23 U/L (ref 0–35)
AST: 21 U/L (ref 0–37)
BUN: 18 mg/dL (ref 6–23)
CHLORIDE: 104 meq/L (ref 96–112)
CO2: 24 mEq/L (ref 19–32)
Calcium: 8.8 mg/dL (ref 8.4–10.5)
Creatinine, Ser: 0.98 mg/dL (ref 0.40–1.20)
GFR: 60.19 mL/min (ref >60.00)
Glucose, Bld: 86 mg/dL (ref 70–99)
POTASSIUM: 3.7 meq/L (ref 3.5–5.1)
SODIUM: 137 meq/L (ref 135–145)
TOTAL PROTEIN: 7.3 g/dL (ref 6.0–8.3)
Total Bilirubin: 0.6 mg/dL (ref 0.2–1.2)

## 2017-03-09 LAB — MAGNESIUM: MAGNESIUM: 1.7 mg/dL (ref 1.5–2.5)

## 2017-03-09 LAB — C-REACTIVE PROTEIN: CRP: 0.1 mg/dL — AB (ref 0.5–20.0)

## 2017-03-09 LAB — PHOSPHORUS: PHOSPHORUS: 3.9 mg/dL (ref 2.3–4.6)

## 2017-03-10 LAB — PREALBUMIN: Prealbumin: 20 mg/dL (ref 17–34)

## 2017-03-13 NOTE — Progress Notes (Signed)
Labs stable My Chart

## 2017-03-21 ENCOUNTER — Other Ambulatory Visit: Payer: Self-pay

## 2017-03-21 MED ORDER — OMEPRAZOLE 40 MG PO CPDR: 40.0000 mg | DELAYED_RELEASE_CAPSULE | Freq: Two times a day (BID) | ORAL | 1 refills | Status: DC

## 2017-03-21 NOTE — Telephone Encounter (Signed)
90 day supply sent in to local [facility] for her omeprazole 40 capsules as requested.

## 2017-04-06 ENCOUNTER — Other Ambulatory Visit (INDEPENDENT_AMBULATORY_CARE_PROVIDER_SITE_OTHER): Payer: Medicare Other

## 2017-04-06 DIAGNOSIS — K6389 Other specified diseases of intestine: Secondary | ICD-10-CM

## 2017-04-06 DIAGNOSIS — K90829 Short bowel syndrome, unspecified: Secondary | ICD-10-CM

## 2017-04-06 DIAGNOSIS — K912 Postsurgical malabsorption, not elsewhere classified: Secondary | ICD-10-CM | POA: Diagnosis not present

## 2017-04-06 LAB — COMPREHENSIVE METABOLIC PANEL
ALBUMIN: 3.6 g/dL (ref 3.5–5.2)
ALT: 26 U/L (ref 0–35)
AST: 22 U/L (ref 0–37)
Alkaline Phosphatase: 79 U/L (ref 39–117)
BILIRUBIN TOTAL: 0.6 mg/dL (ref 0.2–1.2)
BUN: 14 mg/dL (ref 6–23)
CALCIUM: 8.8 mg/dL (ref 8.4–10.5)
CHLORIDE: 108 meq/L (ref 96–112)
CO2: 21 meq/L (ref 19–32)
CREATININE: 0.98 mg/dL (ref 0.40–1.20)
GFR: 60.18 mL/min (ref >60.00)
Glucose, Bld: 83 mg/dL (ref 70–99)
Potassium: 3.7 mEq/L (ref 3.5–5.1)
SODIUM: 137 meq/L (ref 135–145)
Total Protein: 7.8 g/dL (ref 6.0–8.3)

## 2017-04-06 LAB — C-REACTIVE PROTEIN: CRP: 0.1 mg/dL — ABNORMAL LOW (ref 0.5–20.0)

## 2017-04-06 LAB — CBC WITH DIFFERENTIAL/PLATELET
BASOS ABS: 0.1 10*3/uL (ref 0.0–0.1)
BASOS PCT: 1.4 % (ref 0.0–3.0)
EOS ABS: 0.5 10*3/uL (ref 0.0–0.7)
Eosinophils Relative: 8.2 % — ABNORMAL HIGH (ref 0.0–5.0)
HEMATOCRIT: 36.3 % (ref 36.0–46.0)
Hemoglobin: 11.7 g/dL — ABNORMAL LOW (ref 12.0–15.0)
LYMPHS PCT: 40.6 % (ref 12.0–46.0)
Lymphs Abs: 2.3 10*3/uL (ref 0.7–4.0)
MCHC: 32.4 g/dL (ref 30.0–36.0)
MCV: 100.2 fl — ABNORMAL HIGH (ref 78.0–100.0)
MONO ABS: 0.6 10*3/uL (ref 0.1–1.0)
Monocytes Relative: 9.9 % (ref 3.0–12.0)
NEUTROS ABS: 2.3 10*3/uL (ref 1.4–7.7)
Neutrophils Relative %: 39.9 % — ABNORMAL LOW (ref 43.0–77.0)
PLATELETS: 119 10*3/uL — AB (ref 150.0–400.0)
RBC: 3.62 Mil/uL — ABNORMAL LOW (ref 3.87–5.11)
RDW: 14.3 % (ref 11.5–15.5)
WBC: 5.8 10*3/uL (ref 4.0–10.5)

## 2017-04-06 LAB — PHOSPHORUS: Phosphorus: 3.8 mg/dL (ref 2.3–4.6)

## 2017-04-06 LAB — TRIGLYCERIDES: Triglycerides: 169 mg/dL — ABNORMAL HIGH (ref 0.0–149.0)

## 2017-04-06 LAB — MAGNESIUM: Magnesium: 1.8 mg/dL (ref 1.5–2.5)

## 2017-04-06 LAB — BILIRUBIN, DIRECT: Bilirubin, Direct: 0.2 mg/dL (ref 0.0–0.3)

## 2017-04-07 ENCOUNTER — Ambulatory Visit (INDEPENDENT_AMBULATORY_CARE_PROVIDER_SITE_OTHER): Payer: Medicare Other | Admitting: Internal Medicine

## 2017-04-07 ENCOUNTER — Encounter: Payer: Self-pay | Admitting: Internal Medicine

## 2017-04-07 DIAGNOSIS — K912 Postsurgical malabsorption, not elsewhere classified: Secondary | ICD-10-CM

## 2017-04-07 DIAGNOSIS — K6389 Other specified diseases of intestine: Secondary | ICD-10-CM

## 2017-04-07 DIAGNOSIS — E441 Mild protein-calorie malnutrition: Secondary | ICD-10-CM

## 2017-04-07 DIAGNOSIS — Z23 Encounter for immunization: Secondary | ICD-10-CM | POA: Diagnosis not present

## 2017-04-07 LAB — PREALBUMIN: PREALBUMIN: 22 mg/dL (ref 17–34)

## 2017-04-07 NOTE — Progress Notes (Signed)
Labs reviewed at office visit today. Patient weight significantly improved labs okay. Need to contact TPN administration agency and pharmacist and reduce TPN to 4 days out of 7 down from 5 days out of 7.

## 2017-04-07 NOTE — Assessment & Plan Note (Signed)
Reduce TPN

## 2017-04-07 NOTE — Assessment & Plan Note (Signed)
Stable

## 2017-04-07 NOTE — Patient Instructions (Addendum)
We are glad your doing well.  We are going to see about getting your TPN dropped to 4 days a week.  Today you have been given a Prevnar 13 vaccine.  Follow up with Dr Carlean Purl in 6 months.   I appreciate the opportunity to care for you. Silvano Rusk, MD, [facility]

## 2017-04-07 NOTE — Progress Notes (Signed)
   [name] [name] 65 y.o. October 29, 1950 [id number]  Assessment & Plan:  Acquired short bowel syndrome Reduce TPN  Mild malnutrition (HCC) Improved  Bacterial overgrowth syndrome Stable  Will reduce TPN to 4 d out of the week Prevnar 13 vaccination today next year she should get a booster on her Pneumovax she had flu vaccine this year Return to clinic in about 6 months Subjective:   Chief Complaint: Follow-up of weight loss in the setting of short bowel syndrome  HPI [name] [name] is here today feeling better.  She has gained weight as can be seen below.  This occurred after she moved to 5 days of TPN up from 4.  She has also avoided the hospital and has not had recurrent line sepsis etc.  Her PICC line is doing well.  She has no particular complaints today her medication regimen is working well.   Wt Readings from Last 3 Encounters:  04/07/17 142 lb (64.4 kg)  12/08/16 136 lb (61.7 kg)  09/18/16 131 lb (59.4 kg)    Medications allergies, past medical and surgical history social history family history reviewed and updated in the chart.  See the EMR.  Review of Systems As per HPI  Objective:   Physical Exam BP 130/72   Pulse 68   Ht 5\' 8"  (1.727 m)   Wt 142 lb (64.4 kg)   LMP 02/26/2014   BMI 21.59 kg/m  Eyes anicteric Lungs CTA Cor S1s2 no rmg abd soft NT and stable scar tissue felt RLQ PICC Left upper chest ok

## 2017-04-07 NOTE — Assessment & Plan Note (Signed)
Improved

## 2017-04-17 ENCOUNTER — Encounter: Payer: Self-pay | Admitting: Family Medicine

## 2017-04-17 ENCOUNTER — Ambulatory Visit (INDEPENDENT_AMBULATORY_CARE_PROVIDER_SITE_OTHER): Payer: Medicare Other | Admitting: Family Medicine

## 2017-04-17 ENCOUNTER — Other Ambulatory Visit: Payer: Self-pay

## 2017-04-17 VITALS — BP 123/83 | HR 67 | Temp 98.7°F | Resp 16 | Ht 68.0 in | Wt 145.0 lb

## 2017-04-17 DIAGNOSIS — J329 Chronic sinusitis, unspecified: Secondary | ICD-10-CM | POA: Diagnosis not present

## 2017-04-17 DIAGNOSIS — R05 Cough: Secondary | ICD-10-CM

## 2017-04-17 DIAGNOSIS — B9689 Other specified bacterial agents as the cause of diseases classified elsewhere: Secondary | ICD-10-CM | POA: Diagnosis not present

## 2017-04-17 DIAGNOSIS — R059 Cough, unspecified: Secondary | ICD-10-CM

## 2017-04-17 MED ORDER — ALBUTEROL SULFATE (2.5 MG/3ML) 0.083% IN NEBU
2.5000 mg | INHALATION_SOLUTION | Freq: Once | RESPIRATORY_TRACT | Status: AC
  Administered 2017-04-17: 2.5 mg via RESPIRATORY_TRACT

## 2017-04-17 MED ORDER — PROMETHAZINE-DM 6.25-15 MG/5ML PO SYRP
5.0000 mL | ORAL_SOLUTION | Freq: Four times a day (QID) | ORAL | 0 refills | Status: DC | PRN
Start: 2017-04-17 — End: 2017-06-10

## 2017-04-17 MED ORDER — DOXYCYCLINE HYCLATE 100 MG PO TABS: 100.0000 mg | ORAL_TABLET | Freq: Two times a day (BID) | ORAL | 0 refills | Status: DC

## 2017-04-17 MED ORDER — ALBUTEROL SULFATE HFA 108 (90 BASE) MCG/ACT IN AERS: 2.0000 | INHALATION_SPRAY | Freq: Four times a day (QID) | RESPIRATORY_TRACT | 2 refills | Status: DC | PRN

## 2017-04-17 NOTE — Patient Instructions (Signed)
Follow up as needed or as scheduled Start the Doxycycline twice daily- take w/ food Use the cough syrup as needed- may cause drowsiness Drink plenty of fluids REST! Use the Albuterol inhaler- 2 puffs every 4-6 hrs as needed for cough, wheeze, or shortness of breath Call with any questions or concerns Hang in there! Happy Holidays!!!

## 2017-04-17 NOTE — Progress Notes (Signed)
   Subjective:    Patient ID: [name] [name], female    DOB: [birth year], 66 y.o.   MRN: [id number]  HPI URI- saw Dr Carlean Purl on 11/27 and at that time had nasal congestion, sinus pressure, drainage and since then sxs have worsened.  Now w/ deep, wet, productive cough x3-4 days.  Sputum is 'thick but clear'.  Subjective fevers in the evening.  Taking Mucinex DM.  R ear fullness and continued frontal sinus pressure.   Review of Systems For ROS see HPI     Objective:   Physical Exam  Constitutional: She is oriented to person, place, and time. She appears well-developed and well-nourished. No distress.  HENT:  Head: Normocephalic and atraumatic.  Right Ear: Tympanic membrane normal.  Left Ear: Tympanic membrane normal.  Nose: Mucosal edema and rhinorrhea present. Right sinus exhibits frontal sinus tenderness. Right sinus exhibits no maxillary sinus tenderness. Left sinus exhibits frontal sinus tenderness. Left sinus exhibits no maxillary sinus tenderness.  Mouth/Throat: Uvula is midline and mucous membranes are normal. Posterior oropharyngeal erythema present. No oropharyngeal exudate.  Eyes: Conjunctivae and EOM are normal. Pupils are equal, round, and reactive to light.  Neck: Normal range of motion. Neck supple.  Cardiovascular: Normal rate, regular rhythm and normal heart sounds.  Pulmonary/Chest: Effort normal. No respiratory distress. She has wheezes (scattered wheezing thoughout, improved s/p neb tx).  + hacking cough  Lymphadenopathy:    She has no cervical adenopathy.  Neurological: She is alert and oriented to person, place, and time.  Skin: Skin is warm and dry.  Psychiatric: She has a normal mood and affect. Her behavior is normal. Thought content normal.  Vitals reviewed.         Assessment & Plan:  Frontal sinusitis- new.  Pt's sxs and PE consistent w/ infxn.  Start Doxy to cover both frontal sinusitis and possible bronchitis.  Cough and wheezing improved s/p neb tx in  office.  Start cough meds prn and albuterol HFA for home use.  Reviewed supportive care and red flags that should prompt return.  Pt expressed understanding and is in agreement w/ plan.

## 2017-05-18 ENCOUNTER — Other Ambulatory Visit (INDEPENDENT_AMBULATORY_CARE_PROVIDER_SITE_OTHER): Payer: Medicare Other

## 2017-05-18 DIAGNOSIS — K6389 Other specified diseases of intestine: Secondary | ICD-10-CM | POA: Diagnosis not present

## 2017-05-18 DIAGNOSIS — K912 Postsurgical malabsorption, not elsewhere classified: Secondary | ICD-10-CM

## 2017-05-18 LAB — CBC WITH DIFFERENTIAL/PLATELET
BASOS ABS: 0 10*3/uL (ref 0.0–0.1)
Basophils Relative: 0.6 % (ref 0.0–3.0)
EOS ABS: 0.4 10*3/uL (ref 0.0–0.7)
Eosinophils Relative: 6.5 % — ABNORMAL HIGH (ref 0.0–5.0)
HEMATOCRIT: 34.6 % — AB (ref 36.0–46.0)
HEMOGLOBIN: 11.2 g/dL — AB (ref 12.0–15.0)
LYMPHS PCT: 33.3 % (ref 12.0–46.0)
Lymphs Abs: 2 10*3/uL (ref 0.7–4.0)
MCHC: 32.3 g/dL (ref 30.0–36.0)
MCV: 98.4 fl (ref 78.0–100.0)
MONO ABS: 0.9 10*3/uL (ref 0.1–1.0)
Monocytes Relative: 14.8 % — ABNORMAL HIGH (ref 3.0–12.0)
Neutro Abs: 2.7 10*3/uL (ref 1.4–7.7)
Neutrophils Relative %: 44.8 % (ref 43.0–77.0)
Platelets: 126 10*3/uL — ABNORMAL LOW (ref 150.0–400.0)
RBC: 3.52 Mil/uL — AB (ref 3.87–5.11)
RDW: 14.4 % (ref 11.5–15.5)
WBC: 6 10*3/uL (ref 4.0–10.5)

## 2017-05-18 LAB — MAGNESIUM: MAGNESIUM: 1.8 mg/dL (ref 1.5–2.5)

## 2017-05-18 LAB — C-REACTIVE PROTEIN: CRP: 0.7 mg/dL (ref 0.5–20.0)

## 2017-05-18 LAB — PHOSPHORUS: PHOSPHORUS: 3.6 mg/dL (ref 2.3–4.6)

## 2017-05-19 LAB — PREALBUMIN: PREALBUMIN: 16 mg/dL — AB (ref 17–34)

## 2017-05-19 NOTE — Progress Notes (Signed)
Labs stable except mild decrease in prealbumin

## 2017-06-03 ENCOUNTER — Other Ambulatory Visit: Payer: Self-pay | Admitting: Internal Medicine

## 2017-06-03 NOTE — Telephone Encounter (Signed)
Okay to refill Sir? 

## 2017-06-03 NOTE — Telephone Encounter (Signed)
Refill x 1 year 

## 2017-06-03 NOTE — Telephone Encounter (Signed)
Refilled as approved. 

## 2017-06-10 ENCOUNTER — Ambulatory Visit (INDEPENDENT_AMBULATORY_CARE_PROVIDER_SITE_OTHER): Payer: Medicare Other | Admitting: Family Medicine

## 2017-06-10 ENCOUNTER — Other Ambulatory Visit: Payer: Self-pay

## 2017-06-10 ENCOUNTER — Encounter: Payer: Self-pay | Admitting: Family Medicine

## 2017-06-10 VITALS — BP 124/80 | HR 76 | Temp 98.3°F | Resp 16 | Ht 68.0 in | Wt 141.4 lb

## 2017-06-10 DIAGNOSIS — B9689 Other specified bacterial agents as the cause of diseases classified elsewhere: Secondary | ICD-10-CM | POA: Diagnosis not present

## 2017-06-10 DIAGNOSIS — J329 Chronic sinusitis, unspecified: Secondary | ICD-10-CM

## 2017-06-10 DIAGNOSIS — R05 Cough: Secondary | ICD-10-CM

## 2017-06-10 DIAGNOSIS — R059 Cough, unspecified: Secondary | ICD-10-CM

## 2017-06-10 MED ORDER — DOXYCYCLINE HYCLATE 100 MG PO TABS: 100.0000 mg | ORAL_TABLET | Freq: Two times a day (BID) | ORAL | 0 refills | Status: DC

## 2017-06-10 MED ORDER — IPRATROPIUM-ALBUTEROL 0.5-2.5 (3) MG/3ML IN SOLN
3.0000 mL | Freq: Once | RESPIRATORY_TRACT | Status: AC
  Administered 2017-06-10: 3 mL via RESPIRATORY_TRACT

## 2017-06-10 NOTE — Patient Instructions (Signed)
Follow up as needed or as scheduled Start the Doxycycline twice daily- take w/ food Drink plenty of fluids Use the Mucinex for cough and congestion Continue to use the albuterol inhaler for shortness of breath/wheezing Use the Promethazine cough syrup as needed Call with any questions or concerns Hang in there!!!

## 2017-06-10 NOTE — Progress Notes (Signed)
   Subjective:    Patient ID: [name] [name], female    DOB: [birth year], 67 y.o.   MRN: [id number]  HPI URI- sxs started 3-4 days ago.  Taking Mucinex DM w/o relief and using Albuterol inhaler.  Cough is productive.  + frontal sinus pain and HA.  R ear pressure.  Mild nausea.  No vomiting.  + sick contacts.   Review of Systems For ROS see HPI     Objective:   Physical Exam  Constitutional: She is oriented to person, place, and time. She appears well-developed and well-nourished. No distress.  HENT:  Head: Normocephalic and atraumatic.  TMs normal bilaterally Mild nasal congestion TTP over maxillary sinuses bilaterally, no TTP over frontal sinuses Throat w/out erythema, edema, or exudate  Eyes: Conjunctivae and EOM are normal. Pupils are equal, round, and reactive to light.  Neck: Normal range of motion. Neck supple.  Cardiovascular: Normal rate, regular rhythm, normal heart sounds and intact distal pulses.  No murmur heard. Pulmonary/Chest: Effort normal and breath sounds normal. No respiratory distress. She has no wheezes.  + hacking cough Very poor air movement diffusely- improved s/p neb tx  Lymphadenopathy:    She has no cervical adenopathy.  Neurological: She is alert and oriented to person, place, and time.  Psychiatric: She has a normal mood and affect. Her behavior is normal. Thought content normal.          Assessment & Plan:  Bacterial sinusitis- pt's sxs and PE consistent w/ infxn.  Start abx.  Pt's air movement improved s/p neb and cough improved.  Encouraged her to use albuterol inhaler.  Cough meds prn.  Reviewed supportive care and red flags that should prompt return.  Pt expressed understanding and is in agreement w/ plan.

## 2017-06-16 ENCOUNTER — Other Ambulatory Visit (INDEPENDENT_AMBULATORY_CARE_PROVIDER_SITE_OTHER): Payer: Medicare Other

## 2017-06-16 DIAGNOSIS — K912 Postsurgical malabsorption, not elsewhere classified: Secondary | ICD-10-CM | POA: Diagnosis not present

## 2017-06-16 DIAGNOSIS — K6389 Other specified diseases of intestine: Secondary | ICD-10-CM

## 2017-06-16 LAB — PHOSPHORUS: PHOSPHORUS: 3.7 mg/dL (ref 2.3–4.6)

## 2017-06-16 LAB — CBC WITH DIFFERENTIAL/PLATELET
BASOS PCT: 1.1 % (ref 0.0–3.0)
Basophils Absolute: 0.1 10*3/uL (ref 0.0–0.1)
EOS PCT: 6.9 % — AB (ref 0.0–5.0)
Eosinophils Absolute: 0.5 10*3/uL (ref 0.0–0.7)
HEMATOCRIT: 35.1 % — AB (ref 36.0–46.0)
HEMOGLOBIN: 11.4 g/dL — AB (ref 12.0–15.0)
LYMPHS PCT: 34.8 % (ref 12.0–46.0)
Lymphs Abs: 2.5 10*3/uL (ref 0.7–4.0)
MCHC: 32.6 g/dL (ref 30.0–36.0)
MCV: 96.5 fl (ref 78.0–100.0)
MONO ABS: 0.8 10*3/uL (ref 0.1–1.0)
MONOS PCT: 11.1 % (ref 3.0–12.0)
Neutro Abs: 3.3 10*3/uL (ref 1.4–7.7)
Neutrophils Relative %: 46.1 % (ref 43.0–77.0)
Platelets: 135 10*3/uL — ABNORMAL LOW (ref 150.0–400.0)
RBC: 3.63 Mil/uL — AB (ref 3.87–5.11)
RDW: 14.4 % (ref 11.5–15.5)
WBC: 7 10*3/uL (ref 4.0–10.5)

## 2017-06-16 LAB — MAGNESIUM: MAGNESIUM: 1.9 mg/dL (ref 1.5–2.5)

## 2017-06-16 LAB — C-REACTIVE PROTEIN: CRP: 0.1 mg/dL — ABNORMAL LOW (ref 0.5–20.0)

## 2017-06-17 LAB — PREALBUMIN: PREALBUMIN: 17 mg/dL (ref 17–34)

## 2017-06-18 NOTE — Progress Notes (Signed)
Labs ok.

## 2017-06-22 ENCOUNTER — Telehealth: Payer: Self-pay | Admitting: Internal Medicine

## 2017-06-22 ENCOUNTER — Other Ambulatory Visit (INDEPENDENT_AMBULATORY_CARE_PROVIDER_SITE_OTHER): Payer: Medicare Other

## 2017-06-22 DIAGNOSIS — K912 Postsurgical malabsorption, not elsewhere classified: Secondary | ICD-10-CM

## 2017-06-22 LAB — COMPREHENSIVE METABOLIC PANEL WITH GFR
ALT: 12 U/L (ref 0–35)
AST: 15 U/L (ref 0–37)
Albumin: 3.1 g/dL — ABNORMAL LOW (ref 3.5–5.2)
Alkaline Phosphatase: 55 U/L (ref 39–117)
BUN: 13 mg/dL (ref 6–23)
CO2: 24 meq/L (ref 19–32)
Calcium: 8.4 mg/dL (ref 8.4–10.5)
Chloride: 107 meq/L (ref 96–112)
Creatinine, Ser: 0.94 mg/dL (ref 0.40–1.20)
GFR: 63.1 mL/min
Glucose, Bld: 83 mg/dL (ref 70–99)
Potassium: 3.7 meq/L (ref 3.5–5.1)
Sodium: 140 meq/L (ref 135–145)
Total Bilirubin: 0.5 mg/dL (ref 0.2–1.2)
Total Protein: 7 g/dL (ref 6.0–8.3)

## 2017-06-22 NOTE — Telephone Encounter (Signed)
Order in epic, pt aware and will come today.

## 2017-06-24 NOTE — Progress Notes (Signed)
Labs ok.

## 2017-07-13 ENCOUNTER — Other Ambulatory Visit: Payer: Self-pay

## 2017-07-13 ENCOUNTER — Telehealth: Payer: Self-pay | Admitting: Internal Medicine

## 2017-07-13 ENCOUNTER — Other Ambulatory Visit (INDEPENDENT_AMBULATORY_CARE_PROVIDER_SITE_OTHER): Payer: Medicare Other

## 2017-07-13 DIAGNOSIS — K6389 Other specified diseases of intestine: Secondary | ICD-10-CM

## 2017-07-13 DIAGNOSIS — K912 Postsurgical malabsorption, not elsewhere classified: Secondary | ICD-10-CM | POA: Diagnosis not present

## 2017-07-13 LAB — PHOSPHORUS: Phosphorus: 3.5 mg/dL (ref 2.3–4.6)

## 2017-07-13 LAB — CBC WITH DIFFERENTIAL/PLATELET
Basophils Absolute: 0 10*3/uL (ref 0.0–0.1)
Basophils Relative: 0.5 % (ref 0.0–3.0)
Eosinophils Absolute: 0.7 10*3/uL (ref 0.0–0.7)
Eosinophils Relative: 7.6 % — ABNORMAL HIGH (ref 0.0–5.0)
HCT: 35.9 % — ABNORMAL LOW (ref 36.0–46.0)
Hemoglobin: 11.9 g/dL — ABNORMAL LOW (ref 12.0–15.0)
Lymphocytes Relative: 32.1 % (ref 12.0–46.0)
Lymphs Abs: 2.9 10*3/uL (ref 0.7–4.0)
MCHC: 33 g/dL (ref 30.0–36.0)
MCV: 97.1 fl (ref 78.0–100.0)
Monocytes Absolute: 1 10*3/uL (ref 0.1–1.0)
Monocytes Relative: 11.1 % (ref 3.0–12.0)
Neutro Abs: 4.5 10*3/uL (ref 1.4–7.7)
Neutrophils Relative %: 48.7 % (ref 43.0–77.0)
Platelets: 121 10*3/uL — ABNORMAL LOW (ref 150.0–400.0)
RBC: 3.7 Mil/uL — ABNORMAL LOW (ref 3.87–5.11)
RDW: 15.1 % (ref 11.5–15.5)
WBC: 9.2 10*3/uL (ref 4.0–10.5)

## 2017-07-13 LAB — COMPREHENSIVE METABOLIC PANEL
ALT: 18 U/L (ref 0–35)
AST: 17 U/L (ref 0–37)
Albumin: 3.3 g/dL — ABNORMAL LOW (ref 3.5–5.2)
Alkaline Phosphatase: 71 U/L (ref 39–117)
BILIRUBIN TOTAL: 0.5 mg/dL (ref 0.2–1.2)
BUN: 11 mg/dL (ref 6–23)
CALCIUM: 9.1 mg/dL (ref 8.4–10.5)
CO2: 23 mEq/L (ref 19–32)
CREATININE: 0.97 mg/dL (ref 0.40–1.20)
Chloride: 110 mEq/L (ref 96–112)
GFR: 60.85 mL/min (ref >60.00)
Glucose, Bld: 79 mg/dL (ref 70–99)
Potassium: 3.5 mEq/L (ref 3.5–5.1)
SODIUM: 140 meq/L (ref 135–145)
TOTAL PROTEIN: 7.8 g/dL (ref 6.0–8.3)

## 2017-07-13 LAB — MAGNESIUM: Magnesium: 1.7 mg/dL (ref 1.5–2.5)

## 2017-07-13 LAB — C-REACTIVE PROTEIN: CRP: 0.1 mg/dL — ABNORMAL LOW (ref 0.5–20.0)

## 2017-07-13 NOTE — Telephone Encounter (Signed)
Patient states she is coming in today for labs and just wants to make her a CMP is on her standing orders. Pt requesting call just to verify.

## 2017-07-13 NOTE — Telephone Encounter (Signed)
  I called and told [place] I was sorry I didn't get her orders put in before she came today. Peter Congo put them in for me this AM and I have entered them standing for future lab draws. She will see Dr Carlean Purl on 07/31/17.

## 2017-07-14 DIAGNOSIS — R0981 Nasal congestion: Secondary | ICD-10-CM | POA: Diagnosis not present

## 2017-07-14 DIAGNOSIS — J019 Acute sinusitis, unspecified: Secondary | ICD-10-CM | POA: Diagnosis not present

## 2017-07-14 DIAGNOSIS — K912 Postsurgical malabsorption, not elsewhere classified: Secondary | ICD-10-CM | POA: Diagnosis not present

## 2017-07-14 DIAGNOSIS — B9689 Other specified bacterial agents as the cause of diseases classified elsewhere: Secondary | ICD-10-CM | POA: Diagnosis not present

## 2017-07-14 LAB — PREALBUMIN: Prealbumin: 17 mg/dL (ref 17–34)

## 2017-07-15 NOTE — Progress Notes (Signed)
Labs NL or mild stable abnormalities

## 2017-07-16 ENCOUNTER — Other Ambulatory Visit: Payer: Self-pay | Admitting: Physician Assistant

## 2017-07-16 ENCOUNTER — Other Ambulatory Visit: Payer: Self-pay

## 2017-07-16 ENCOUNTER — Other Ambulatory Visit: Payer: Self-pay | Admitting: General Practice

## 2017-07-16 ENCOUNTER — Ambulatory Visit (INDEPENDENT_AMBULATORY_CARE_PROVIDER_SITE_OTHER): Payer: Medicare Other | Admitting: Physician Assistant

## 2017-07-16 ENCOUNTER — Ambulatory Visit (HOSPITAL_BASED_OUTPATIENT_CLINIC_OR_DEPARTMENT_OTHER)
Admission: RE | Admit: 2017-07-16 | Discharge: 2017-07-16 | Disposition: A | Discharge status: Home / Self Care | Payer: Medicare Other | Source: Ambulatory Visit | Attending: Physician Assistant | Admitting: Physician Assistant

## 2017-07-16 ENCOUNTER — Encounter: Payer: Self-pay | Admitting: Physician Assistant

## 2017-07-16 VITALS — BP 110/70 | HR 62 | Temp 97.8°F | Resp 16 | Ht 68.0 in | Wt 146.0 lb

## 2017-07-16 DIAGNOSIS — M7989 Other specified soft tissue disorders: Secondary | ICD-10-CM | POA: Insufficient documentation

## 2017-07-16 DIAGNOSIS — I82492 Acute embolism and thrombosis of other specified deep vein of left lower extremity: Secondary | ICD-10-CM | POA: Diagnosis not present

## 2017-07-16 DIAGNOSIS — I82622 Acute embolism and thrombosis of deep veins of left upper extremity: Secondary | ICD-10-CM | POA: Diagnosis not present

## 2017-07-16 MED ORDER — RIVAROXABAN (XARELTO) VTE STARTER PACK (15 & 20 MG): ORAL_TABLET | ORAL | 0 refills | Status: DC

## 2017-07-16 NOTE — Patient Instructions (Signed)
Please keep arm iced and elevated.   Stop by the front desk to speak with Benjamine Mola who is setting up your Ultrasound today.  They will be calling me with results before letting you leave the imaging suite and we will alter treatment.   If you note any redness, hardness of the skin, these are signs of infection. Please call me immediately if these develop.  ER for any chest discomfort or shortness of breath.

## 2017-07-16 NOTE — Progress Notes (Signed)
Patient presents to clinic today c/o 1-1/2 days of swelling of left upper extremity, associated with enlargement of her veins in the arm.  Patient notes some soreness in this area.  Denies redness or hardness of skin.  Denies increased warmth of skin.  Patient denies trauma or injury.  Denies numbness or tingling.  Patient with history of PICC line infection.  Has an old PICC line site in this area and is wanting to make sure it does not seem infected.  Patient also with a history of DVT, not currently on anticoagulation.  Past Medical History:  Diagnosis Date  . Abnormal LFTs 2013  . Allergic rhinosinusitis   . Anemia of chronic disease   . At risk for dental problems   . Atypical nevus   . Bacteremia due to Klebsiella pneumoniae 12/12/2011  . Bacterial overgrowth syndrome   . 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 Carotid stenosis    Carotid US (9/15):  R 40-59%; L 1-39% >> FU 1 year  . Congestive heart failure (CHF) (HCC)    EF 25-30%  . Fracture of left clavicle   . History of blood transfusion 2013   anemia  . Hx of cardiovascular stress test    Myoview (9/15):  inf-apical scar; no ischemia; EF 47% - low risk   . 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 Osteoporosis   . Pancytopenia 10/07/2011  . Pathologic fracture of neck of femur ([place])   . Personal history of colonic polyps   . Renal insufficiency    hx of yrs ago  . Serratia marcescens infection - bactermia assoc w/ PICC 01/18/2015  . 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 Splenomegaly    By ultrasound  . Thrombophilia ([place])   . Vitamin D deficiency     Current Outpatient Medications on File Prior to Visit  Medication Sig Dispense Refill  . ADULT TPN 1,800 mLs 4 (four) times a week. Pt receives home TPN from [facility]:  1800 mL bag, four nights weekly (Monday, Wednesday, Thursday, and Friday) for 8 hours (includes 1 hour taper up and down).    Marland Kitchen albuterol  (PROVENTIL HFA;VENTOLIN HFA) 108 (90 Base) MCG/ACT inhaler Inhale 2 puffs into the lungs every 6 (six) hours as needed for wheezing or shortness of breath. 1 Inhaler 2  . aspirin EC 81 MG tablet Take 1 tablet (81 mg total) by mouth daily.    . diphenhydrAMINE (BENADRYL) 25 mg capsule Take 25-50 mg by mouth every 6 (six) hours as needed for itching or sleep.     . diphenoxylate-atropine (LOMOTIL) 2.5-0.025 MG tablet Take 1 tablet by mouth 4 (four) times daily as needed for diarrhea or loose stools. 90 tablet 5  . doxycycline (VIBRAMYCIN) 100 MG capsule TAKE 1 CAPSULE EVERY 12 HOURS  0  . ibuprofen (ADVIL,MOTRIN) 200 MG tablet Take 400 mg by mouth every 6 (six) hours as needed for headache, mild pain or moderate pain.    . Multiple Vitamin (MULTIVITAMIN WITH MINERALS) TABS Take 1 tablet by mouth daily.    Marland Kitchen omeprazole (PRILOSEC) 40 MG capsule Take 1 capsule (40 mg total) 2 (two) times daily by mouth. Take before breakfast and supper 180 capsule 1  . ondansetron (ZOFRAN-ODT) 8 MG disintegrating tablet DISSOLVE 1 TABLET BY MOUTH EVERY 8 HOURS AS NEEDED FOR NAUSEA OR VOMITING 30 MIN BEFORE INFUSION 30 tablet 11  . Teduglutide, rDNA, (GATTEX) 5  MG KIT Inject 3.3 Units into the skin daily. 1 kit 11  . vitamin E 400 UNIT capsule Take 400 Units by mouth daily.      No current facility-administered medications on file prior to visit.     Allergies  Allergen Reactions  . Iohexol Itching and Other (See Comments)    Pt is able to use IVP dye if taken with Benadryl.    Marland Kitchen Penicillins Itching, Swelling and Other (See Comments)    Reaction:  Facial swelling Has patient had a PCN reaction causing immediate rash, facial/tongue/throat swelling, SOB or lightheadedness with hypotension: Yes Has patient had a PCN reaction causing severe rash involving mucus membranes or skin necrosis: No Has patient had a PCN reaction that required hospitalization No Has patient had a PCN reaction occurring within the last 10  years: No If all of the above answers are "NO", then may proceed with Cephalosporin use.    Family History  Problem Relation Age of Onset  . Diabetes Mother   . Hypertension Mother   . Colon cancer Neg Hx   . Stomach cancer Neg Hx     Social History   Socioeconomic History  . Marital status: Married    Spouse name: None  . Number of children: None  . Years of education: None  . Highest education level: None  Social Needs  . Financial resource strain: None  . Food insecurity - worry: None  . Food insecurity - inability: None  . Transportation needs - medical: None  . Transportation needs - non-medical: None  Occupational History  . None  Tobacco Use  . Smoking status: Former Smoker    Types: Cigarettes    Last attempt to quit: 05/12/2004    Years since quitting: 13.1  . Smokeless tobacco: Never Used  Substance and Sexual Activity  . Alcohol use: No  . Drug use: No  . Sexual activity: Not Currently  Other Topics Concern  . 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  All other ROS are negative.  BP 110/70   Pulse 62   Temp 97.8 F (36.6 C) (Oral)   Resp 16   Ht '5\' 8"'$  (1.727 m)   Wt 146 lb (66.2 kg)   LMP 02/26/2014   SpO2 99%   BMI 22.20 kg/m   Physical Exam  Constitutional: She is oriented to person, place, and time and well-developed, well-nourished, and in no distress.  HENT:  Head: Normocephalic and atraumatic.  Cardiovascular: Normal rate, regular rhythm, normal heart sounds and intact distal pulses.  Pulses:      Radial pulses are 2+ on the right side, and 2+ on the left side.  There is edema noted of L upper extremity from the level of mid bicep distally. No pitting edema noted. Veins are distended on visual examination. No noted redness or  induration on examination.  Pulmonary/Chest: Effort normal and breath sounds normal. No respiratory distress. She has no wheezes. She has no rales. She exhibits no tenderness.  Neurological: She is alert and oriented to person, place, and time.  Skin: Skin is warm and dry.  Vitals reviewed.  Recent Results (from the past 2160 hour(s))  Phosphorus     Status: None   Collection  Time: 05/18/17  9:53 AM  Result Value Ref Range   Phosphorus 3.6 2.3 - 4.6 mg/dL  C-reactive protein     Status: None   Collection Time: 05/18/17  9:53 AM  Result Value Ref Range   CRP 0.7 0.5 - 20.0 mg/dL  Prealbumin     Status: Abnormal   Collection Time: 05/18/17  9:53 AM  Result Value Ref Range   Prealbumin 16 (L) 17 - 34 mg/dL  Magnesium     Status: None   Collection Time: 05/18/17  9:53 AM  Result Value Ref Range   Magnesium 1.8 1.5 - 2.5 mg/dL  CBC with Differential/Platelet     Status: Abnormal   Collection Time: 05/18/17  9:53 AM  Result Value Ref Range   WBC 6.0 4.0 - 10.5 K/uL   RBC 3.52 (L) 3.87 - 5.11 Mil/uL   Hemoglobin 11.2 (L) 12.0 - 15.0 g/dL   HCT 34.6 (L) 36.0 - 46.0 %   MCV 98.4 78.0 - 100.0 fl   MCHC 32.3 30.0 - 36.0 g/dL   RDW 14.4 11.5 - 15.5 %   Platelets 126.0 (L) 150.0 - 400.0 K/uL   Neutrophils Relative % 44.8 43.0 - 77.0 %   Lymphocytes Relative 33.3 12.0 - 46.0 %   Monocytes Relative 14.8 (H) 3.0 - 12.0 %   Eosinophils Relative 6.5 (H) 0.0 - 5.0 %   Basophils Relative 0.6 0.0 - 3.0 %   Neutro Abs 2.7 1.4 - 7.7 K/uL   Lymphs Abs 2.0 0.7 - 4.0 K/uL   Monocytes Absolute 0.9 0.1 - 1.0 K/uL   Eosinophils Absolute 0.4 0.0 - 0.7 K/uL   Basophils Absolute 0.0 0.0 - 0.1 K/uL  Phosphorus     Status: None   Collection Time: 06/16/17  9:53 AM  Result Value Ref Range   Phosphorus 3.7 2.3 - 4.6 mg/dL  C-reactive protein     Status: Abnormal   Collection Time: 06/16/17  9:53 AM  Result Value Ref Range   CRP 0.1 (L) 0.5 - 20.0 mg/dL  Magnesium     Status: None   Collection Time:  06/16/17  9:53 AM  Result Value Ref Range   Magnesium 1.9 1.5 - 2.5 mg/dL  CBC with Differential/Platelet     Status: Abnormal   Collection Time: 06/16/17  9:53 AM  Result Value Ref Range   WBC 7.0 4.0 - 10.5 K/uL   RBC 3.63 (L) 3.87 - 5.11 Mil/uL   Hemoglobin 11.4 (L) 12.0 - 15.0 g/dL   HCT 35.1 (L) 36.0 - 46.0 %   MCV 96.5 78.0 - 100.0 fl   MCHC 32.6 30.0 - 36.0 g/dL   RDW 14.4 11.5 - 15.5 %   Platelets 135.0 (L) 150.0 - 400.0 K/uL   Neutrophils Relative % 46.1 43.0 - 77.0 %   Lymphocytes Relative 34.8 12.0 - 46.0 %   Monocytes Relative 11.1 3.0 - 12.0 %   Eosinophils Relative 6.9 (H) 0.0 - 5.0 %   Basophils Relative 1.1 0.0 - 3.0 %   Neutro Abs 3.3 1.4 - 7.7 K/uL   Lymphs Abs 2.5 0.7 - 4.0 K/uL   Monocytes Absolute 0.8 0.1 - 1.0 K/uL   Eosinophils Absolute 0.5 0.0 - 0.7 K/uL   Basophils Absolute 0.1 0.0 - 0.1 K/uL  Prealbumin     Status: None   Collection Time: 06/16/17  9:55 AM  Result Value Ref Range   Prealbumin 17 17 - 34 mg/dL  Comp Met (CMET)  Status: Abnormal   Collection Time: 06/22/17 11:38 AM  Result Value Ref Range   Sodium 140 135 - 145 mEq/L   Potassium 3.7 3.5 - 5.1 mEq/L   Chloride 107 96 - 112 mEq/L   CO2 24 19 - 32 mEq/L   Glucose, Bld 83 70 - 99 mg/dL   BUN 13 6 - 23 mg/dL   Creatinine, Ser 0.94 0.40 - 1.20 mg/dL   Total Bilirubin 0.5 0.2 - 1.2 mg/dL   Alkaline Phosphatase 55 39 - 117 U/L   AST 15 0 - 37 U/L   ALT 12 0 - 35 U/L   Total Protein 7.0 6.0 - 8.3 g/dL   Albumin 3.1 Repeated and verified X2. (L) 3.5 - 5.2 g/dL   Calcium 8.4 8.4 - 10.5 mg/dL   GFR 63.10 >60.00 mL/min  Comp Met (CMET)     Status: Abnormal   Collection Time: 07/13/17 11:38 AM  Result Value Ref Range   Sodium 140 135 - 145 mEq/L   Potassium 3.5 3.5 - 5.1 mEq/L   Chloride 110 96 - 112 mEq/L   CO2 23 19 - 32 mEq/L   Glucose, Bld 79 70 - 99 mg/dL   BUN 11 6 - 23 mg/dL   Creatinine, Ser 0.97 0.40 - 1.20 mg/dL   Total Bilirubin 0.5 0.2 - 1.2 mg/dL   Alkaline  Phosphatase 71 39 - 117 U/L   AST 17 0 - 37 U/L   ALT 18 0 - 35 U/L   Total Protein 7.8 6.0 - 8.3 g/dL   Albumin 3.3 (L) 3.5 - 5.2 g/dL   Calcium 9.1 8.4 - 10.5 mg/dL   GFR 60.85 >60.00 mL/min  Phosphorus     Status: None   Collection Time: 07/13/17 11:38 AM  Result Value Ref Range   Phosphorus 3.5 2.3 - 4.6 mg/dL  Prealbumin     Status: None   Collection Time: 07/13/17 11:38 AM  Result Value Ref Range   Prealbumin 17 17 - 34 mg/dL  Magnesium     Status: None   Collection Time: 07/13/17 11:38 AM  Result Value Ref Range   Magnesium 1.7 1.5 - 2.5 mg/dL  CBC with Differential/Platelet     Status: Abnormal   Collection Time: 07/13/17 11:38 AM  Result Value Ref Range   WBC 9.2 4.0 - 10.5 K/uL   RBC 3.70 (L) 3.87 - 5.11 Mil/uL   Hemoglobin 11.9 (L) 12.0 - 15.0 g/dL   HCT 35.9 (L) 36.0 - 46.0 %   MCV 97.1 78.0 - 100.0 fl   MCHC 33.0 30.0 - 36.0 g/dL   RDW 15.1 11.5 - 15.5 %   Platelets 121.0 (L) 150.0 - 400.0 K/uL   Neutrophils Relative % 48.7 43.0 - 77.0 %   Lymphocytes Relative 32.1 12.0 - 46.0 %   Monocytes Relative 11.1 3.0 - 12.0 %   Eosinophils Relative 7.6 (H) 0.0 - 5.0 %   Basophils Relative 0.5 0.0 - 3.0 %   Neutro Abs 4.5 1.4 - 7.7 K/uL   Lymphs Abs 2.9 0.7 - 4.0 K/uL   Monocytes Absolute 1.0 0.1 - 1.0 K/uL   Eosinophils Absolute 0.7 0.0 - 0.7 K/uL   Basophils Absolute 0.0 0.0 - 0.1 K/uL  C-reactive protein     Status: Abnormal   Collection Time: 07/13/17 11:38 AM  Result Value Ref Range   CRP 0.1 (L) 0.5 - 20.0 mg/dL    Assessment/Plan: 1. Left upper extremity swelling No sign of infection. Concern for  clot giving symptoms, exam and history. Will obtain STAT doppler to assess for clot. Supportive measures reviewed. Strict ER precautions given. Will alter treatment according to results. - US Venous Img Upper Uni Left; Future   Leeanne Rio, PA-C

## 2017-07-17 ENCOUNTER — Encounter (HOSPITAL_COMMUNITY): Payer: Self-pay

## 2017-07-17 ENCOUNTER — Other Ambulatory Visit: Payer: Self-pay | Admitting: Physician Assistant

## 2017-07-17 ENCOUNTER — Ambulatory Visit (HOSPITAL_COMMUNITY)
Admission: RE | Admit: 2017-07-17 | Discharge: 2017-07-17 | Disposition: A | Discharge status: Home / Self Care | Payer: Medicare Other | Source: Ambulatory Visit | Attending: Physician Assistant | Admitting: Physician Assistant

## 2017-07-17 DIAGNOSIS — I82A12 Acute embolism and thrombosis of left axillary vein: Secondary | ICD-10-CM

## 2017-07-17 DIAGNOSIS — I82622 Acute embolism and thrombosis of deep veins of left upper extremity: Secondary | ICD-10-CM

## 2017-07-20 ENCOUNTER — Other Ambulatory Visit: Payer: Self-pay | Admitting: Physician Assistant

## 2017-07-20 ENCOUNTER — Telehealth: Payer: Self-pay | Admitting: Internal Medicine

## 2017-07-20 ENCOUNTER — Telehealth: Payer: Self-pay | Admitting: Family Medicine

## 2017-07-20 DIAGNOSIS — I82A12 Acute embolism and thrombosis of left axillary vein: Secondary | ICD-10-CM

## 2017-07-20 NOTE — Telephone Encounter (Signed)
Please advise with patient to give reassurance during the process. She was upset on Friday.

## 2017-07-20 NOTE — Telephone Encounter (Signed)
Patient wanting to let [facility] and Dr.Gessner know that she had a sonogram done last Thursday 3.7.19 where they found a clot and put her on BT xarelto. FYI

## 2017-07-20 NOTE — Telephone Encounter (Signed)
Her primary care is working on [employer] referral

## 2017-07-20 NOTE — Telephone Encounter (Signed)
[name] [name] will you please call patient as I am still in clinic. We are trying to get a response from Interventional Radiology for an appointment. The purpose of this is just to get their input on the clot -- to see if want to perform any further intervention (removal). I want to ease her mind that the radiologist is likely recommending this due to the clot being in her arm versus lower extremity and her history of issues with Picc lines, etc.  [name] is working diligently on this. She has to keep leaving VM and awaiting a callback.  The Xarelto will help prevent any further clots and she should continue taking as directed.   [name] will call her as soon as she gets a response.

## 2017-07-20 NOTE — Telephone Encounter (Signed)
Copied from [place] [id number]. Topic: Quick Communication - Other Results >> Jul 20, 2017  8:33 AM Marja Kays F wrote: Pt is wanting to talk with [facility] regarding her ultrasound over the weekend and what the next step is   Best number

## 2017-07-20 NOTE — Telephone Encounter (Signed)
I know  If she has not gotten an appt w/ IR yet perhaps we can expedite that - looks like she was referred but no appt yet

## 2017-07-20 NOTE — Telephone Encounter (Signed)
Spoke with patient regarding appointment and concerns.  Patient states her arm remains swollen but has improved. Advised to continue Xarelto.  While on phone, patient received call from radiology with appointment date and time (07/21/17 @ 12pm).  Patient denies further questions and very thankful for call.

## 2017-07-21 ENCOUNTER — Encounter: Payer: Self-pay | Admitting: *Deleted

## 2017-07-21 ENCOUNTER — Ambulatory Visit
Admission: RE | Admit: 2017-07-21 | Discharge: 2017-07-21 | Disposition: A | Discharge status: Home / Self Care | Payer: Medicare Other | Source: Ambulatory Visit | Attending: Physician Assistant | Admitting: Physician Assistant

## 2017-07-21 DIAGNOSIS — I82A12 Acute embolism and thrombosis of left axillary vein: Secondary | ICD-10-CM

## 2017-07-21 DIAGNOSIS — I82622 Acute embolism and thrombosis of deep veins of left upper extremity: Secondary | ICD-10-CM | POA: Diagnosis not present

## 2017-07-21 HISTORY — PX: IR RADIOLOGIST EVAL & MGMT: IMG5224

## 2017-07-21 NOTE — Consult Note (Signed)
Chief Complaint: Patient was seen in consultation today for left upper extremity DVT at the request of Martin,William C  Referring Physician(s): Martin,William C  History of Present Illness: [name] [name] is a 67 y.o. 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 She currently has an indwelling left tunneled central line via the jugular vein and receives overnight parenteral nutrition 4 nights a week.  Last week the patient noticed acute edema of the left upper extremity and prominence of superficial veins in the left arm. Upper extremity duplex ultrasound was performed on 07/16/2017 demonstrating occlusive or near occlusive thrombus in the visualized left innominate vein extending into the central aspect of the subclavian vein. Other arm veins demonstrated no evidence of DVT or superficial thrombophlebitis.  She was placed on Xarelto 15 mg twice a day last Friday. Since that time, left upper extremity edema has improved significantly with some mild residual edema remaining in the left arm compared to the right. She has been trying to elevate the arm as much as possible at home. She denies any upper extremity pain or discoloration. The indwelling central catheter has been functioning well with no evidence of dysfunction. The catheter exit site has had no signs of infection.  Past Medical History:  Diagnosis Date  . Abnormal LFTs 2013  . Allergic rhinosinusitis   . Anemia of chronic disease   . At risk for dental problems   . Atypical nevus   . Bacteremia due to Klebsiella pneumoniae 12/12/2011  . Bacterial overgrowth syndrome   . 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 Carotid stenosis    Carotid US (9/15):  R 40-59%; L 1-39% >> FU 1  year  . Congestive heart failure (CHF) (HCC)    EF 25-30%  . Fracture of left clavicle   . History of blood transfusion 2013   anemia  . Hx of cardiovascular stress test    Myoview (9/15):  inf-apical scar; no ischemia; EF 47% - low risk   . 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 Osteoporosis   . Pancytopenia 10/07/2011  . Pathologic fracture of neck of femur ([place])   . Personal history of colonic polyps   . Renal insufficiency    hx of yrs ago  . Serratia marcescens infection - bactermia assoc w/ PICC 01/18/2015  . 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 Splenomegaly    By ultrasound  . Thrombophilia ([place])   . Vitamin D deficiency     Past Surgical History:  Procedure Laterality Date  . APPENDECTOMY  yrs ago  . CHOLECYSTECTOMY  yrs ago  . COLONOSCOPY  12/05/2005   internal hemorrhoids (for polyp surveillance)  . COLONOSCOPY  04/26/2012   Procedure: COLONOSCOPY;  Surgeon: Gatha Mayer, MD;  Location: [facility];  Service: Endoscopy;  Laterality: N/A;  . COLONOSCOPY WITH PROPOFOL N/A 03/18/2016   Procedure: COLONOSCOPY WITH PROPOFOL;  Surgeon: Gatha Mayer, MD;  Location: [facility];  Service: Endoscopy;  Laterality: N/A;  . ESOPHAGOGASTRODUODENOSCOPY  01/22/2009   erosive esophagitis  . IR GENERIC HISTORICAL  07/04/2016   IR REMOVAL TUN CV CATH W/O FL 07/04/2016 Ascencion Dike, PA-C [facility]  . IR GENERIC HISTORICAL  07/10/2016  IR US GUIDE VASC ACCESS LEFT 07/10/2016 Arne Cleveland, MD [facility]  . IR GENERIC HISTORICAL  07/10/2016   IR FLUORO GUIDE CV LINE LEFT 07/10/2016 Arne Cleveland, MD [facility]  . ORIF PROXIMAL FEMORAL FRACTURE W/ ITST NAIL SYSTEM  03/2007   left, Dr. Shellia Carwin  . SMALL INTESTINE SURGERY  2005   multiple with right colon resection for ischemia/infarct    Allergies: Iohexol and Penicillins  Medications: Prior to Admission medications   Medication Sig Start Date End Date Taking? Authorizing  Provider  ADULT TPN 1,800 mLs 4 (four) times a week. Pt receives home TPN from [facility]:  1800 mL bag, four nights weekly (Monday, Wednesday, Thursday, and Friday) for 8 hours (includes 1 hour taper up and down).   Yes [provider]  albuterol (PROVENTIL HFA;VENTOLIN HFA) 108 (90 Base) MCG/ACT inhaler Inhale 2 puffs into the lungs every 6 (six) hours as needed for wheezing or shortness of breath. 04/17/17  Yes [name], MD  aspirin EC 81 MG tablet Take 1 tablet (81 mg total) by mouth daily. 11/17/13  Yes Weaver, Scott T, PA-C  diphenhydrAMINE (BENADRYL) 25 mg capsule Take 25-50 mg by mouth every 6 (six) hours as needed for itching or sleep.    Yes [provider]  diphenoxylate-atropine (LOMOTIL) 2.5-0.025 MG tablet Take 1 tablet by mouth 4 (four) times daily as needed for diarrhea or loose stools. 03/24/16  Yes Gatha Mayer, MD  ibuprofen (ADVIL,MOTRIN) 200 MG tablet Take 400 mg by mouth every 6 (six) hours as needed for headache, mild pain or moderate pain.   Yes [provider]  Multiple Vitamin (MULTIVITAMIN WITH MINERALS) TABS Take 1 tablet by mouth daily.   Yes [provider]  omeprazole (PRILOSEC) 40 MG capsule Take 1 capsule (40 mg total) 2 (two) times daily by mouth. Take before breakfast and supper 03/21/17  Yes Gatha Mayer, MD  ondansetron (ZOFRAN-ODT) 8 MG disintegrating tablet DISSOLVE 1 TABLET BY MOUTH EVERY 8 HOURS AS NEEDED FOR NAUSEA OR VOMITING 30 MIN BEFORE INFUSION 06/03/17  Yes Gatha Mayer, MD  Rivaroxaban 15 & 20 MG TBPK Take as directed on package: Start with one 10m tablet by mouth twice a day with food. On Day 22, switch to one 24mtablet once a day with food. 07/16/17  Yes TaMidge MiniumMD  Teduglutide, rDNA, (GATTEX) 5 MG KIT Inject 3.3 Units into the skin daily. 09/11/14  Yes GeGatha MayerMD  vitamin E 400 UNIT capsule Take 400 Units by mouth daily.    Yes [provider]  doxycycline (VIBRAMYCIN) 100  MG capsule TAKE 1 CAPSULE EVERY 12 HOURS 07/14/17   [provider]     Family History  Problem Relation Age of Onset  . Diabetes Mother   . Hypertension Mother   . Colon cancer Neg Hx   . Stomach cancer Neg Hx     Social History   Socioeconomic History  . Marital status: Married    Spouse name: Not on file  . Number of children: Not on file  . Years of education: Not on file  . Highest education level: Not on file  Social Needs  . Financial resource strain: Not on file  . Food insecurity - worry: Not on file  . Food insecurity - inability: Not on file  . Transportation needs - medical: Not on file  . Transportation needs - non-medical: Not on file  Occupational History  . Not on  file  Tobacco Use  . Smoking status: Former Smoker    Types: Cigarettes    Last attempt to quit: 05/12/2004    Years since quitting: 13.2  . Smokeless tobacco: Never Used  Substance and Sexual Activity  . Alcohol use: No  . Drug use: No  . Sexual activity: Not Currently  Other Topics Concern  . Not on file  Social History Narrative   Married to Herbie [place], has at least 1 daughter and a granddaughter    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   No alcohol tobacco or drug use   04/07/2017    Review of Systems: A 12 point ROS discussed and pertinent positives are indicated in the HPI above.  All other systems are negative.  Review of Systems  Constitutional: Negative.   Respiratory: Negative.   Cardiovascular: Negative.   Gastrointestinal: Negative.   Genitourinary: Negative.   Musculoskeletal: Negative for arthralgias and myalgias.       Left arm edema.  Skin: Negative.   Neurological: Negative.     Vital Signs: BP (!) 143/83   Pulse (!) 109   Temp 98.2 F (36.8 C) (Oral)   Resp 16   Ht _0  (1.727 m)   Wt 137 lb (62.1 kg)   LMP 02/26/2014   SpO2 92%   BMI 20.83 kg/m   Physical Exam  Constitutional: She is oriented to person,  place, and time. She appears well-developed and well-nourished. No distress.  HENT:  Head: Normocephalic and atraumatic.  Neck: Neck supple. No JVD present.  Cardiovascular: Normal rate, regular rhythm and normal heart sounds. Exam reveals no gallop and no friction rub.  No murmur heard. Normal bilateral radial and ulnar pulses.  Pulmonary/Chest: Effort normal and breath sounds normal. No stridor. No respiratory distress. She has no wheezes. She has no rales.  Abdominal: Soft. Bowel sounds are normal. She exhibits no distension and no mass. There is no tenderness. There is no rebound and no guarding.  Musculoskeletal:  Mild left upper extremity edema and noticeable superficial veins in arms and across chest.  No ulcerations, discoloration, cyanosis.  Lymphadenopathy:    She has no cervical adenopathy.  Neurological: She is alert and oriented to person, place, and time. No sensory deficit.  Normal strength in both upper extremities.  Skin: Skin is warm and dry. Capillary refill takes less than 2 seconds. No rash noted. She is not diaphoretic. No erythema.  Vitals reviewed.   Imaging: US Venous Img Upper Uni Left  Result Date: 07/16/2017 CLINICAL DATA:  67 year old female with venous dilation and edema. Indwelling left upper extremity PICC. EXAM: LEFT UPPER EXTREMITY VENOUS DOPPLER ULTRASOUND TECHNIQUE: Gray-scale sonography with graded compression, as well as color Doppler and duplex ultrasound were performed to evaluate the upper extremity deep venous system from the level of the subclavian vein and including the jugular, axillary, basilic, radial, ulnar and upper cephalic vein. Spectral Doppler was utilized to evaluate flow at rest and with distal augmentation maneuvers. COMPARISON:  None. FINDINGS: Contralateral Subclavian Vein: Respiratory phasicity is normal and symmetric with the symptomatic side. No evidence of thrombus. Normal compressibility. Internal Jugular Vein: No evidence of  thrombus. Normal compressibility and response to augmentation. Decreased respiratory phasicity. Subclavian Vein: Small volume of thrombus extends into the central aspect of the subclavian vein. Axillary Vein: No evidence of thrombus. Normal compressibility and response to augmentation. Decreased respiratory phasicity. Cephalic Vein: No evidence of thrombus. Normal compressibility and response to augmentation. Decreased  respiratory phasicity. Basilic Vein: No evidence of thrombus. Normal compressibility and response to augmentation. Decreased respiratory phasicity. Brachial Veins: No evidence of thrombus. Normal compressibility and response to augmentation. Decreased respiratory phasicity. Radial Veins: No evidence of thrombus. Normal compressibility and response to augmentation. Decreased respiratory phasicity. Ulnar Veins: No evidence of thrombus. Normal compressibility and response to augmentation. Decreased respiratory phasicity. Venous Reflux:  None visualized. Other Findings: Visualization of the left innominate vein demonstrates that the lumen is filled with low-level internal echoes consistent with thrombus. Color is not visualized on color Doppler imaging. IMPRESSION: 1. Positive for occlusive, or near occlusive DVT in the left innominate vein extending into the central aspect of the subclavian vein. 2. The remainder of the deep veins of the left upper extremity remain patent but demonstrate decreased respiratory phasicity (expected given the central occlusion in the innominate vein). Consider referral to Interventional Radiology for further evaluation and management. Electronically Signed   By: Jacqulynn Cadet M.D.   On: 07/16/2017 16:41    Labs:  CBC: Recent Labs    04/06/17 1001 05/18/17 0953 06/16/17 0953 07/13/17 1138  WBC 5.8 6.0 7.0 9.2  HGB 11.7* 11.2* 11.4* 11.9*  HCT 36.3 34.6* 35.1* 35.9*  PLT 119.0* 126.0* 135.0* 121.0*    COAGS: No results for input(s): INR, APTT in the last  8760 hours.  BMP: Recent Labs    03/09/17 0933 04/06/17 1001 06/22/17 1138 07/13/17 1138  NA 137 137 140 140  K 3.7 3.7 3.7 3.5  CL 104 108 107 110  CO2 _0 GLUCOSE 86 83 83 79  BUN _1 CALCIUM 8.8 8.8 8.4 9.1  CREATININE 0.98 0.98 0.94 0.97    LIVER FUNCTION TESTS: Recent Labs    03/09/17 0933 04/06/17 1001 06/22/17 1138 07/13/17 1138  BILITOT 0.6 0.6 0.5 0.5  AST _2 ALT _3 ALKPHOS 73 79 55 71  PROT 7.3 7.8 7.0 7.8  ALBUMIN 3.4* 3.6 3.1 Repeated and verified X2.* 3.3*    Assessment and Plan:  I met with Mrs. Suastegui and her husband. We reviewed imaging findings. On the ultrasound, it is difficult to determine what component of chronic versus acute thrombus is present in the innominate vein and whether there may be underlying chronic stenosis of the vein related to the indwelling central venous catheter. The fact that she has been improving since starting anticoagulation would suggest that she may have developed some acute thrombus at the level of an underlying chronic central venous stenosis.  There is no need for venography or further intervention at this time given improvement on anticoagulation. Thrombolytic therapy, thrombectomy and venous intervention with possible angioplasty and/or stent placement would be difficult with an indwelling tunneled central catheter present. If she were to develop some increasing left upper extremity edema on anticoagulation, she might benefit from determining whether a tunneled central line might be able to be placed via the right internal jugular vein. This would then allow removal of the left-sided catheter. Venography could also be considered at that time to determine if there is a chronic central venous stenosis on the left side.  Mrs. Austad was asked to contact our office if left upper extremity worsens on anticoagulation. We can see her back and recheck a left upper extremity duplex ultrasound at that  time to determine status of thrombus and whether further intervention is indicated.  Thank you for this interesting consult.  I greatly enjoyed  meeting Karra C Leadbetter and look forward to participating in their care.  A copy of this report was sent to the requesting provider on this date.  Electronically SignedAletta Edouard T 07/21/2017, 1:44 PM   I spent a total of 40 Minutes  in face to face in clinical consultation, greater than 50% of which was counseling/coordinating care for left upper extremity DVT.

## 2017-07-24 ENCOUNTER — Telehealth: Payer: Self-pay | Admitting: Radiology

## 2017-07-24 NOTE — Telephone Encounter (Signed)
Patient called w/ concern of swelling & tenderness in Left Breast.  Afebrile.  No redness noted.    Dr Kathlene Cote notified.  His instructions were given to patient as follows:  Symptoms may be related to collateral circulation.  Continue anticoagulation and monitoring of symptoms.  She is to contact our office if symptoms worsen or go to the ED.  Somtochukwu Woollard Riki Rusk, RN 07/24/2017 9:55 AM

## 2017-07-31 ENCOUNTER — Ambulatory Visit (INDEPENDENT_AMBULATORY_CARE_PROVIDER_SITE_OTHER): Payer: Medicare Other | Admitting: Internal Medicine

## 2017-07-31 ENCOUNTER — Encounter: Payer: Self-pay | Admitting: Internal Medicine

## 2017-07-31 VITALS — BP 110/60 | HR 72 | Ht 66.5 in | Wt 147.5 lb

## 2017-07-31 DIAGNOSIS — K6389 Other specified diseases of intestine: Secondary | ICD-10-CM | POA: Diagnosis not present

## 2017-07-31 DIAGNOSIS — D6859 Other primary thrombophilia: Secondary | ICD-10-CM | POA: Diagnosis not present

## 2017-07-31 DIAGNOSIS — I82622 Acute embolism and thrombosis of deep veins of left upper extremity: Secondary | ICD-10-CM | POA: Diagnosis not present

## 2017-07-31 DIAGNOSIS — K912 Postsurgical malabsorption, not elsewhere classified: Secondary | ICD-10-CM

## 2017-07-31 DIAGNOSIS — I82409 Acute embolism and thrombosis of unspecified deep veins of unspecified lower extremity: Secondary | ICD-10-CM

## 2017-07-31 HISTORY — DX: Acute embolism and thrombosis of unspecified deep veins of unspecified lower extremity: I82.409

## 2017-07-31 NOTE — Assessment & Plan Note (Signed)
Doing well RTC 6 mos 

## 2017-07-31 NOTE — Assessment & Plan Note (Signed)
I think she should continue the Xarelto indefinitely.  We will see if she needs a hematology appointment again.

## 2017-07-31 NOTE — Assessment & Plan Note (Signed)
Not sure this is at the root of her recent DVT but makes one wonder. Gattex and warfarin have a possible interaction but not Xarelto that I am aware of.  I think she should stay on Xarelto long-term.  We will see if she needs hematology input again.

## 2017-07-31 NOTE — Assessment & Plan Note (Signed)
-

## 2017-07-31 NOTE — Progress Notes (Addendum)
[name] [name] 67 y.o. 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  We will see if she needs a hematology appointment again.  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 Gattex and warfarin have a possible interaction but not Xarelto that I am aware of.  I think she should stay on Xarelto long-term.  We will see if she needs hematology input again.   I appreciate the opportunity to care for this patient. CC: [name] Minium, MD  Reviewed with hematology and they agree with staying on Xarelto long-term and that she does not need specific hematology follow-up now or in the foreseeable future but on an as-needed basis.  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  She took warfarin for a while but when she started on Gattex because of a possible interaction it was discontinued or she did so.  She had done well for a number of years to get X has made a big difference, she continues on 4 days of TPN or nights if you will and weight has been stable lately.  See below, I think the 137 was erroneous.  Earlier this month she noted her left upper arm started to swell and saw primary care and Doppler ultrasound was ordered which demonstrated a subclavian vein thrombus.  She was started on Xarelto.  She saw interventional radiology and because her edema and swelling was improving Dr. Kathlene Cote thought discontinue the Xarelto and no other further intervention was needed.  Her deep vein port is functioning well with respect to her TPN as best I know.   Otherwise she is stable overall.  Her husband just retired and that is going well. 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 Iohexol Itching and Other (See Comments)    Pt is able to use IVP dye if taken with Benadryl.    Marland Kitchen Penicillins Itching, Swelling and Other (See Comments)    Reaction:  Facial swelling Has patient had a PCN reaction causing immediate rash, facial/tongue/throat swelling, SOB or lightheadedness with hypotension: Yes Has patient had a PCN reaction causing severe rash involving mucus membranes or skin necrosis: No Has patient had a PCN reaction that required hospitalization No Has patient had a PCN reaction occurring within the last 10 years: No If all of the above answers are "NO", then may proceed with Cephalosporin use.   Current Meds  Medication Sig  . ADULT TPN 1,800 mLs 4 (four) times a week. Pt receives home TPN from [facility]:  1800 mL bag, four nights weekly (Monday, Wednesday, Thursday, and Friday) for 8 hours (includes 1 hour taper up and down).  Marland Kitchen albuterol (PROVENTIL HFA;VENTOLIN HFA) 108 (90 Base) MCG/ACT inhaler Inhale 2 puffs into the lungs every 6 (six)  hours as needed for wheezing or shortness of breath.  Marland Kitchen aspirin EC 81 MG tablet Take 1 tablet (81 mg total) by mouth daily.  . diphenhydrAMINE (BENADRYL) 25 mg capsule Take 25-50 mg by mouth every 6 (six) hours as needed for itching or sleep.   . diphenoxylate-atropine (LOMOTIL) 2.5-0.025 MG tablet Take 1 tablet by mouth 4 (four) times daily as needed for diarrhea or loose stools.  Marland Kitchen ibuprofen (ADVIL,MOTRIN) 200 MG tablet Take 400 mg by mouth every 6 (six) hours as needed for headache, mild pain or moderate pain.  .  Multiple Vitamin (MULTIVITAMIN WITH MINERALS) TABS Take 1 tablet by mouth daily.  Marland Kitchen omeprazole (PRILOSEC) 40 MG capsule Take 1 capsule (40 mg total) 2 (two) times daily by mouth. Take before breakfast and supper  . ondansetron (ZOFRAN-ODT) 8 MG disintegrating tablet DISSOLVE 1 TABLET BY MOUTH EVERY 8 HOURS AS NEEDED FOR NAUSEA OR VOMITING 30 MIN BEFORE INFUSION  . Rivaroxaban 15 & 20 MG TBPK Take as directed on package: Start with one '15mg'$  tablet by mouth twice a day with food. On Day 22, switch to one '20mg'$  tablet once a day with food.  . Teduglutide, rDNA, (GATTEX) 5 MG KIT Inject 3.3 Units into the skin daily.  . vitamin E 400 UNIT capsule Take 400 Units by mouth daily.    Past Medical History:  Diagnosis Date  . Abnormal LFTs 2013  . Allergic rhinosinusitis   . Anemia of chronic disease   . At risk for dental problems   . Atypical nevus   . Bacteremia due to Klebsiella pneumoniae 12/12/2011  . Bacterial overgrowth syndrome   . 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 Carotid stenosis    Carotid US (9/15):  R 40-59%; L 1-39% >> FU 1 year  . Congestive heart failure (CHF) (HCC)    EF 25-30%  . 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 Fracture of left clavicle   . History of blood transfusion 2013   anemia  . Hx of cardiovascular stress test    Myoview (9/15):  inf-apical scar; no ischemia; EF 47% - low risk   . 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 Osteoporosis   . Pancytopenia 10/07/2011  . Pathologic fracture of neck of femur ([place])   . Personal history of colonic polyps   . Renal insufficiency    hx of yrs ago  . Serratia marcescens infection - bactermia assoc w/ PICC 01/18/2015  . 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 Splenomegaly    By ultrasound  . Thrombophilia ([place])   . Vitamin D deficiency    Past Surgical History:  Procedure Laterality Date  . APPENDECTOMY  yrs ago  . CHOLECYSTECTOMY  yrs  ago  . COLONOSCOPY  12/05/2005   internal hemorrhoids (for polyp surveillance)  . COLONOSCOPY  04/26/2012   Procedure: COLONOSCOPY;  Surgeon: Gatha Mayer, MD;  [facility];  Service: Endoscopy;  Laterality: N/A;  . COLONOSCOPY WITH PROPOFOL N/A 03/18/2016   Procedure: COLONOSCOPY WITH PROPOFOL;  Surgeon: Gatha Mayer, MD;  [facility];  Service: Endoscopy;  Laterality: N/A;  . ESOPHAGOGASTRODUODENOSCOPY  01/22/2009   erosive esophagitis  . IR GENERIC HISTORICAL  07/04/2016   IR REMOVAL TUN CV CATH W/O FL 07/04/2016 Ascencion Dike, PA-C [facility]  . IR GENERIC HISTORICAL  07/10/2016   IR US GUIDE VASC ACCESS LEFT 07/10/2016 Arne Cleveland, MD  [facility]  . IR GENERIC HISTORICAL  07/10/2016   IR FLUORO GUIDE CV LINE LEFT 07/10/2016 Arne Cleveland, MD [facility]  . IR RADIOLOGIST EVAL & MGMT  07/21/2017  . ORIF PROXIMAL FEMORAL FRACTURE W/ ITST NAIL SYSTEM  03/2007   left, Dr. Shellia Carwin  . SMALL INTESTINE SURGERY  2005   multiple with right colon resection for ischemia/infarct   Social History   Social History Narrative   Married to [place], has 1 daughter and a granddaughter the patient's son died in childhood due to a motor vehicle wreck   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   No alcohol tobacco or drug use   04/07/2017   family history includes Diabetes in her mother; Hypertension in her mother.   Review of Systems As above  Objective:   Physical Exam  '@BP'$  110/60 (BP Location: Right Arm, Patient Position: Sitting, Cuff Size: Normal)   Pulse 72 Comment: irregular  Ht 5' 6.5" (1.689 m) Comment: height measured without shoes  Wt 147 lb 8 oz (66.9 kg)   LMP 02/26/2014   BMI 23.45 kg/m @  General:  NAD Eyes:   anicteric Lungs:  clear Heart::  S1S2 no rubs, murmurs or gallops Abdomen:  soft and nontender, BS+, chronic firm tissue palpated in RLQ Ext:   Mild edema LUE w/ prominent vv, nontender, no pitting,  axilla no lymphadenopathy  Port in left upper chest    Data Reviewed:  Interventional radiology notes, primary care notes, Doppler ultrasound labs

## 2017-07-31 NOTE — Patient Instructions (Signed)
  Follow up with Dr Carlean Purl in 6 months.  Dr Carlean Purl to consult with your hematologist about follow up and we will be back in touch with you.   I appreciate the opportunity to care for you. Silvano Rusk, MD, [facility]

## 2017-08-10 ENCOUNTER — Other Ambulatory Visit (INDEPENDENT_AMBULATORY_CARE_PROVIDER_SITE_OTHER): Payer: Medicare Other

## 2017-08-10 DIAGNOSIS — K6389 Other specified diseases of intestine: Secondary | ICD-10-CM | POA: Diagnosis not present

## 2017-08-10 DIAGNOSIS — K912 Postsurgical malabsorption, not elsewhere classified: Secondary | ICD-10-CM

## 2017-08-10 LAB — CBC WITH DIFFERENTIAL/PLATELET
Basophils Absolute: 0.1 10*3/uL (ref 0.0–0.1)
Basophils Relative: 1.1 % (ref 0.0–3.0)
EOS ABS: 0.4 10*3/uL (ref 0.0–0.7)
Eosinophils Relative: 6.8 % — ABNORMAL HIGH (ref 0.0–5.0)
HCT: 34.6 % — ABNORMAL LOW (ref 36.0–46.0)
Hemoglobin: 11.2 g/dL — ABNORMAL LOW (ref 12.0–15.0)
LYMPHS ABS: 2.1 10*3/uL (ref 0.7–4.0)
Lymphocytes Relative: 40.8 % (ref 12.0–46.0)
MCHC: 32.4 g/dL (ref 30.0–36.0)
MCV: 96.3 fl (ref 78.0–100.0)
Monocytes Absolute: 0.7 10*3/uL (ref 0.1–1.0)
Monocytes Relative: 14.3 % — ABNORMAL HIGH (ref 3.0–12.0)
NEUTROS ABS: 1.9 10*3/uL (ref 1.4–7.7)
NEUTROS PCT: 37 % — AB (ref 43.0–77.0)
Platelets: 150 10*3/uL (ref 150.0–400.0)
RBC: 3.59 Mil/uL — ABNORMAL LOW (ref 3.87–5.11)
RDW: 14.9 % (ref 11.5–15.5)
WBC: 5.2 10*3/uL (ref 4.0–10.5)

## 2017-08-10 LAB — COMPREHENSIVE METABOLIC PANEL
ALBUMIN: 3.3 g/dL — AB (ref 3.5–5.2)
ALK PHOS: 71 U/L (ref 39–117)
ALT: 17 U/L (ref 0–35)
AST: 18 U/L (ref 0–37)
BUN: 18 mg/dL (ref 6–23)
CALCIUM: 8.6 mg/dL (ref 8.4–10.5)
CO2: 24 mEq/L (ref 19–32)
CREATININE: 0.93 mg/dL (ref 0.40–1.20)
Chloride: 105 mEq/L (ref 96–112)
GFR: 63.86 mL/min (ref >60.00)
Glucose, Bld: 95 mg/dL (ref 70–99)
POTASSIUM: 3.6 meq/L (ref 3.5–5.1)
SODIUM: 137 meq/L (ref 135–145)
TOTAL PROTEIN: 7.6 g/dL (ref 6.0–8.3)
Total Bilirubin: 0.5 mg/dL (ref 0.2–1.2)

## 2017-08-10 LAB — MAGNESIUM: Magnesium: 1.6 mg/dL (ref 1.5–2.5)

## 2017-08-10 LAB — C-REACTIVE PROTEIN: CRP: 0.1 mg/dL — AB (ref 0.5–20.0)

## 2017-08-10 LAB — PHOSPHORUS: PHOSPHORUS: 3.6 mg/dL (ref 2.3–4.6)

## 2017-08-11 LAB — PREALBUMIN: PREALBUMIN: 17 mg/dL (ref 17–34)

## 2017-08-14 ENCOUNTER — Ambulatory Visit (INDEPENDENT_AMBULATORY_CARE_PROVIDER_SITE_OTHER): Payer: Medicare Other | Admitting: Family Medicine

## 2017-08-14 ENCOUNTER — Encounter: Payer: Self-pay | Admitting: Family Medicine

## 2017-08-14 ENCOUNTER — Other Ambulatory Visit: Payer: Self-pay

## 2017-08-14 VITALS — BP 110/72 | HR 78 | Temp 98.7°F | Resp 16 | Ht 67.0 in | Wt 146.0 lb

## 2017-08-14 DIAGNOSIS — I82622 Acute embolism and thrombosis of deep veins of left upper extremity: Secondary | ICD-10-CM

## 2017-08-14 DIAGNOSIS — D6859 Other primary thrombophilia: Secondary | ICD-10-CM

## 2017-08-14 MED ORDER — RIVAROXABAN 20 MG PO TABS: 20.0000 mg | ORAL_TABLET | Freq: Every day | ORAL | 6 refills | Status: DC

## 2017-08-14 NOTE — Assessment & Plan Note (Signed)
Clot remains but per pt report this is less swollen and less tender than previously.  Continue Xarelto 20mg  daily indefinitely.  Encouraged warm compresses to help w/ clot resolution.  Will continue to follow.

## 2017-08-14 NOTE — Progress Notes (Signed)
   Subjective:    Patient ID: [name] [name], female    DOB: 23-Mar-1951, 67 y.o.   MRN: [id number]  HPI DVT/hypercoagulable state- pt is now taking Xarelto due to recent DVT of L subclavian vein.  Pt reports L arm is still swollen, as is L breast.  She discussed this w/ Dr Kathlene Cote and they felt this was nothing to worry about.  Arm remains sore.  Arm and breast are not warm or red.  No abnormal bleeding since starting Xarelto.  Dr Carlean Purl discussed case w/ Dr Lindi Adie and they agreed that she be on indefinite anticoagulation.  She is also on 81mg  ASA w/o difficulty.   Review of Systems For ROS see HPI     Objective:   Physical Exam  Constitutional: She appears well-developed and well-nourished.  Cardiovascular: Intact distal pulses.  Persistent DVT of L subclavian but arm is softer and less tender than when she originally developed clot  Pulmonary/Chest:  No swelling of L breast noted on exam today  Skin: Skin is warm and dry. No erythema.  Vitals reviewed.         Assessment & Plan:

## 2017-08-14 NOTE — Assessment & Plan Note (Signed)
Ongoing issue for pt.  She will now remain on Xarelto indefinitely as well as her 81mg  ASA.  Reviewed possibility of bleeding.  No need to f/u w/ Dr Lindi Adie at this time per Dr Celesta Aver note.  Will follow.

## 2017-08-14 NOTE — Patient Instructions (Signed)
Follow up as needed or as scheduled Continue the Xarelto 20mg  daily Continue the Aspirin 81mg  daily Call with any questions or concerns Happy Spring!!!

## 2017-09-14 ENCOUNTER — Other Ambulatory Visit (INDEPENDENT_AMBULATORY_CARE_PROVIDER_SITE_OTHER): Payer: Medicare Other

## 2017-09-14 DIAGNOSIS — K6389 Other specified diseases of intestine: Secondary | ICD-10-CM

## 2017-09-14 DIAGNOSIS — K912 Postsurgical malabsorption, not elsewhere classified: Secondary | ICD-10-CM

## 2017-09-14 LAB — C-REACTIVE PROTEIN: CRP: 0.1 mg/dL — AB (ref 0.5–20.0)

## 2017-09-14 LAB — CBC WITH DIFFERENTIAL/PLATELET
BASOS ABS: 0.1 10*3/uL (ref 0.0–0.1)
Basophils Relative: 0.9 % (ref 0.0–3.0)
EOS ABS: 0.4 10*3/uL (ref 0.0–0.7)
Eosinophils Relative: 6.4 % — ABNORMAL HIGH (ref 0.0–5.0)
HEMATOCRIT: 33.8 % — AB (ref 36.0–46.0)
Hemoglobin: 11.2 g/dL — ABNORMAL LOW (ref 12.0–15.0)
LYMPHS PCT: 35.9 % (ref 12.0–46.0)
Lymphs Abs: 2.4 10*3/uL (ref 0.7–4.0)
MCHC: 33.2 g/dL (ref 30.0–36.0)
MCV: 96 fl (ref 78.0–100.0)
MONO ABS: 0.9 10*3/uL (ref 0.1–1.0)
Monocytes Relative: 13.2 % — ABNORMAL HIGH (ref 3.0–12.0)
NEUTROS ABS: 2.9 10*3/uL (ref 1.4–7.7)
Neutrophils Relative %: 43.6 % (ref 43.0–77.0)
PLATELETS: 151 10*3/uL (ref 150.0–400.0)
RBC: 3.52 Mil/uL — ABNORMAL LOW (ref 3.87–5.11)
RDW: 15.1 % (ref 11.5–15.5)
WBC: 6.7 10*3/uL (ref 4.0–10.5)

## 2017-09-14 LAB — COMPREHENSIVE METABOLIC PANEL
ALBUMIN: 3.3 g/dL — AB (ref 3.5–5.2)
ALK PHOS: 70 U/L (ref 39–117)
ALT: 17 U/L (ref 0–35)
AST: 16 U/L (ref 0–37)
BILIRUBIN TOTAL: 0.5 mg/dL (ref 0.2–1.2)
BUN: 11 mg/dL (ref 6–23)
CALCIUM: 8.7 mg/dL (ref 8.4–10.5)
CO2: 23 mEq/L (ref 19–32)
Chloride: 108 mEq/L (ref 96–112)
Creatinine, Ser: 0.93 mg/dL (ref 0.40–1.20)
GFR: 63.84 mL/min (ref >60.00)
Glucose, Bld: 80 mg/dL (ref 70–99)
Potassium: 3.6 mEq/L (ref 3.5–5.1)
Sodium: 139 mEq/L (ref 135–145)
TOTAL PROTEIN: 7.4 g/dL (ref 6.0–8.3)

## 2017-09-14 LAB — MAGNESIUM: Magnesium: 1.6 mg/dL (ref 1.5–2.5)

## 2017-09-14 LAB — PHOSPHORUS: PHOSPHORUS: 3.6 mg/dL (ref 2.3–4.6)

## 2017-09-15 LAB — PREALBUMIN: Prealbumin: 15 mg/dL — ABNORMAL LOW (ref 17–34)

## 2017-09-16 NOTE — Progress Notes (Signed)
Prealbumin slightly low Labs overall ok

## 2017-09-18 ENCOUNTER — Ambulatory Visit: Payer: Self-pay

## 2017-09-18 NOTE — Telephone Encounter (Signed)
Pt calling with c/o edema to right arm. Pt was dx with blood clot to right upper arm and was started on xarelto 2 months ago. Pt states the swelling is still present. Denies any pain or redness to the arm. Denies fever. Pt called to get appointment to see Dr Birdie Riddle.  Pt advised to go to ED for any difficulty breathing, redness or worsening sx.  Appt made for pt to be seen 09/21/17 at 1:15 pm. Reason for Disposition . Nursing judgment or information in reference  Answer Assessment - Initial Assessment Questions 1. REASON FOR CALL: "What is your main concern right now?"     Right arm edema 2. ONSET: "When did the ___ start?"     2 months ago 3. SEVERITY: "How bad is the ___?"     Swelling- not shiny but is tight 4. FEVER: "Do you have a fever?"     no 5. OTHER SYMPTOMS: "Do you have any other new symptoms?"     No new symptoms- no redness or pain 6. INTERVENTIONS AND RESPONSE: "What have you done so far to try to make this better? What medications have you used?"     xarelto for the blood clot between elbow and shoulder  7. PREGNANCY: "Is there any chance you are pregnant?"     n/a  Protocols used: NO GUIDELINE AVAILABLE-A-AH

## 2017-09-21 ENCOUNTER — Encounter: Payer: Self-pay | Admitting: Family Medicine

## 2017-09-21 ENCOUNTER — Ambulatory Visit (INDEPENDENT_AMBULATORY_CARE_PROVIDER_SITE_OTHER): Payer: Medicare Other | Admitting: Family Medicine

## 2017-09-21 ENCOUNTER — Other Ambulatory Visit: Payer: Self-pay

## 2017-09-21 VITALS — BP 110/82 | HR 78 | Temp 98.0°F | Resp 16 | Wt 143.0 lb

## 2017-09-21 DIAGNOSIS — I82622 Acute embolism and thrombosis of deep veins of left upper extremity: Secondary | ICD-10-CM

## 2017-09-21 DIAGNOSIS — M7989 Other specified soft tissue disorders: Secondary | ICD-10-CM | POA: Diagnosis not present

## 2017-09-21 NOTE — Assessment & Plan Note (Signed)
Ongoing issue for pt.  She continues to have extensive swelling of L arm and breast.  Denies pain.  Continues on her Xarelto.  At this point, sxs appear to be consistent w/ lymphedema.  Refer to vascular for evaluation and tx.  Pt expressed understanding and is in agreement w/ plan.

## 2017-09-21 NOTE — Patient Instructions (Signed)
Follow up as needed or as scheduled We'll call you with your Vascular appt Consider a compression sleeve for the L arm to help w/ the swelling Call with any questions or concerns Have a great summer!!!

## 2017-09-21 NOTE — Progress Notes (Signed)
   Subjective:    Patient ID: [name] [name], female    DOB: 10/14/1950, 67 y.o.   MRN: [id number]  HPI L arm swelling- pt reports continued swelling despite starting Xarelto 2 months ago.  No pain.  Pt is concerned that the swelling is unchanged and questions whether this will improve.  L breast is also swollen and heavy.   Review of Systems For ROS see HPI     Objective:   Physical Exam  Constitutional: She is oriented to person, place, and time. She appears well-developed and well-nourished. No distress.  HENT:  Head: Normocephalic and atraumatic.  Cardiovascular: Intact distal pulses.  Musculoskeletal: She exhibits edema (taut edema of L forearm, + edema of L upper arm and L lateral breast). She exhibits no tenderness.  Neurological: She is alert and oriented to person, place, and time.  Skin: Skin is warm and dry. No erythema.  Vitals reviewed.         Assessment & Plan:

## 2017-09-29 ENCOUNTER — Other Ambulatory Visit: Payer: Self-pay | Admitting: Internal Medicine

## 2017-09-30 ENCOUNTER — Other Ambulatory Visit (HOSPITAL_COMMUNITY): Payer: Self-pay | Admitting: Interventional Radiology

## 2017-09-30 DIAGNOSIS — Z789 Other specified health status: Secondary | ICD-10-CM

## 2017-10-07 ENCOUNTER — Encounter: Payer: Self-pay | Admitting: Radiology

## 2017-10-07 ENCOUNTER — Other Ambulatory Visit (HOSPITAL_COMMUNITY): Payer: Self-pay | Admitting: Interventional Radiology

## 2017-10-07 ENCOUNTER — Ambulatory Visit (HOSPITAL_COMMUNITY): Admission: RE | Admit: 2017-10-07 | Payer: Medicare Other | Source: Ambulatory Visit

## 2017-10-07 ENCOUNTER — Ambulatory Visit (HOSPITAL_COMMUNITY)
Admission: RE | Admit: 2017-10-07 | Discharge: 2017-10-07 | Disposition: A | Discharge status: Home / Self Care | Payer: Medicare Other | Source: Ambulatory Visit | Attending: Interventional Radiology | Admitting: Interventional Radiology

## 2017-10-07 DIAGNOSIS — Z789 Other specified health status: Secondary | ICD-10-CM

## 2017-10-07 HISTORY — PX: IR PATIENT EVAL TECH 0-60 MINS: IMG5564

## 2017-10-07 NOTE — Procedures (Signed)
Patient came in today with concerns that her tunnelled IJ central catheter may have been pulled out a little.  The catheter was functioning fine with no problems.  There was swelling noted in her right arm but the patient said it was from known a blood clot that her health care team was managing. She had seen IR for this as well in the past and spoke with Dr Kathlene Cote.  She is following up with her vascular doctor next week.   I removed the dressing and evaluated the catheter.  The tunnelled cuff appeared to be in an adequate position approximately 1.5 - 2 cm above the exit site.  Ascencion Dike PA also evaluated the position.  It was determined that the  catheter retaining cuff was in a good position with no significant extra catheter pulled out.  The catheter was site was cleaned and a sterile dressing with a biopatch was applied.  The patient was happy with the evaluation.  She will call us with any further problems she may have regarding the catheter.

## 2017-10-09 ENCOUNTER — Encounter: Payer: Self-pay | Admitting: Vascular Surgery

## 2017-10-09 ENCOUNTER — Other Ambulatory Visit: Payer: Self-pay

## 2017-10-09 ENCOUNTER — Ambulatory Visit (INDEPENDENT_AMBULATORY_CARE_PROVIDER_SITE_OTHER): Payer: Medicare Other | Admitting: Vascular Surgery

## 2017-10-09 VITALS — BP 123/71 | HR 65 | Resp 18 | Ht 67.0 in | Wt 150.7 lb

## 2017-10-09 DIAGNOSIS — K912 Postsurgical malabsorption, not elsewhere classified: Secondary | ICD-10-CM

## 2017-10-09 DIAGNOSIS — M7989 Other specified soft tissue disorders: Secondary | ICD-10-CM | POA: Diagnosis not present

## 2017-10-09 NOTE — Progress Notes (Signed)
Patient ID: [name] [name], female   DOB: [birth year], 67 y.o.   MRN: [id number]  Reason for Consult: New Patient (Initial Visit) (eval L UE DVT - [name])   Referred by [name], MD  Subjective:     HPI:  [name] [name] is a 67 y.o. female presents today for evaluation of left upper extremity swelling along with breast swelling.  She has a history of short gut syndrome following mesenteric thrombosis.  She was continued on Coumadin until recently where she was placed on Xarelto.  She has had multiple indwelling PICC line's and catheters for TPN over the past multiple years.  She has never had lower extremity access.  She currently gets TPN via a left IJ tunneled catheter.  Recently she was seen for swelling in her left upper extremity that was acute she was noted to have occlusion of her subclavian and innominate veins with an acute component.  She was evaluated by interventional radiology but no intervention was undertaken.  Since that time she has elevated her arm has not tried other therapies.  She states that her swelling is constant but not too disabling at this time.  She still has her catheter in her left IJ.  Past Medical History:  Diagnosis Date  . Abnormal LFTs 2013  . Allergic rhinosinusitis   . Anemia of chronic disease   . At risk for dental problems   . Atypical nevus   . Bacteremia due to Klebsiella pneumoniae 12/12/2011  . Bacterial overgrowth syndrome   . 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 Carotid stenosis    Carotid US (9/15):  R 40-59%; L 1-39% >> FU 1 year  . Congestive heart failure (CHF) (HCC)    EF 25-30%  . 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 Fracture of left clavicle   . History of blood transfusion 2013   anemia  . Hx of cardiovascular stress test    Myoview (9/15):  inf-apical scar; no ischemia; EF 47% - low risk   . 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 Osteoporosis   . Pancytopenia 10/07/2011  .  Pathologic fracture of neck of femur ([place])   . Personal history of colonic polyps   . Renal insufficiency    hx of yrs ago  . Serratia marcescens infection - bactermia assoc w/ PICC 01/18/2015  . 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 Splenomegaly    By ultrasound  . Thrombophilia ([place])   . Vitamin D deficiency    Family History  Problem Relation Age of Onset  . Diabetes Mother   . Hypertension Mother   . AAA (abdominal aortic aneurysm) Mother   . Colon cancer Neg Hx   . Stomach cancer Neg Hx    Past Surgical History:  Procedure Laterality Date  . APPENDECTOMY  yrs ago  . CHOLECYSTECTOMY  yrs ago  . COLONOSCOPY  12/05/2005   internal hemorrhoids (for polyp surveillance)  . COLONOSCOPY  04/26/2012   Procedure: COLONOSCOPY;  Surgeon: Gatha Mayer, MD;  Location: [facility];  Service: Endoscopy;  Laterality: N/A;  . COLONOSCOPY WITH PROPOFOL N/A 03/18/2016   Procedure: COLONOSCOPY WITH PROPOFOL;  Surgeon: Gatha Mayer, MD;  Location: [facility] END[facility];  Service: Endoscopy;  Laterality: N/A;  . ESOPHAGOGASTRODUODENOSCOPY  01/22/2009   erosive esophagitis  . IR GENERIC HISTORICAL  07/04/2016   IR REMOVAL TUN CV CATH W/O  [facility] 07/04/2016 Ascencion Dike, PA-C [facility]INTER[facility] RAD  . IR GENERIC HISTORICAL  07/10/2016   IR US GUIDE VASC ACCESS LEFT 07/10/2016 Arne Cleveland, MD [facility]  . IR GENERIC HISTORICAL  07/10/2016   IR FLUORO GUIDE CV LINE LEFT 07/10/2016 Arne Cleveland, MD [facility]  . IR PATIENT EVAL TECH 0-60 MINS  10/07/2017  . IR RADIOLOGIST EVAL & MGMT  07/21/2017  . ORIF PROXIMAL FEMORAL FRACTURE W/ ITST NAIL SYSTEM  03/2007   left, Dr. Shellia Carwin  . SMALL INTESTINE SURGERY  2005   multiple with right colon resection for ischemia/infarct    Short Social History:  Social History   Tobacco Use  . Smoking status: Former Smoker    Types: Cigarettes    Last attempt to quit: 05/12/2004    Years since quitting: 13.4  . Smokeless tobacco:  Never Used  Substance Use Topics  . Alcohol use: No    Allergies  Allergen Reactions  . Iohexol Itching and Other (See Comments)    Pt is able to use IVP dye if taken with Benadryl.    Marland Kitchen Penicillins Itching, Swelling and Other (See Comments)    Reaction:  Facial swelling Has patient had a PCN reaction causing immediate rash, facial/tongue/throat swelling, SOB or lightheadedness with hypotension: Yes Has patient had a PCN reaction causing severe rash involving mucus membranes or skin necrosis: No Has patient had a PCN reaction that required hospitalization No Has patient had a PCN reaction occurring within the last 10 years: No If all of the above answers are "NO", then may proceed with Cephalosporin use.    Current Outpatient Medications  Medication Sig Dispense Refill  . ADULT TPN 1,800 mLs 4 (four) times a week. Pt receives home TPN from [facility]:  1800 mL bag, four nights weekly (Monday, Wednesday, Thursday, and Friday) for 8 hours (includes 1 hour taper up and down).    Marland Kitchen aspirin EC 81 MG tablet Take 1 tablet (81 mg total) by mouth daily.    . diphenhydrAMINE (BENADRYL) 25 mg capsule Take 25-50 mg by mouth every 6 (six) hours as needed for itching or sleep.     . Multiple Vitamin (MULTIVITAMIN WITH MINERALS) TABS Take 1 tablet by mouth daily.    Marland Kitchen omeprazole (PRILOSEC) 40 MG capsule TAKE 1 CAPSULE 2 (TWO) TIMES DAILY BY MOUTH. TAKE BEFORE BREAKFAST AND SUPPER 180 capsule 1  . ondansetron (ZOFRAN-ODT) 8 MG disintegrating tablet DISSOLVE 1 TABLET BY MOUTH EVERY 8 HOURS AS NEEDED FOR NAUSEA OR VOMITING 30 MIN BEFORE INFUSION 30 tablet 11  . rivaroxaban (XARELTO) 20 MG TABS tablet Take 1 tablet (20 mg total) by mouth daily with supper. 30 tablet 6  . Teduglutide, rDNA, (GATTEX) 5 MG KIT Inject 3.3 Units into the skin daily. 1 kit 11  . vitamin E 400 UNIT capsule Take 400 Units by mouth daily.     Marland Kitchen albuterol (PROVENTIL HFA;VENTOLIN HFA) 108 (90 Base) MCG/ACT inhaler Inhale 2 puffs into  the lungs every 6 (six) hours as needed for wheezing or shortness of breath. (Patient not taking: Reported on 09/21/2017) 1 Inhaler 2  . diphenoxylate-atropine (LOMOTIL) 2.5-0.025 MG tablet Take 1 tablet by mouth 4 (four) times daily as needed for diarrhea or loose stools. (Patient not taking: Reported on 09/21/2017) 90 tablet 5   No current facility-administered medications for this visit.     Review of Systems  Constitutional:  Constitutional negative. HENT: HENT negative.  Eyes: Eyes negative.  Cardiovascular: Cardiovascular negative.  GI: Gastrointestinal  negative.  Musculoskeletal:       Left arm and breast swelling Skin: Skin negative.  Neurological: Neurological negative. Hematologic: Hematologic/lymphatic negative.  Psychiatric: Psychiatric negative.        Objective:  Objective   Vitals:   10/09/17 1200  BP: 123/71  Pulse: 65  Resp: 18  SpO2: 99%  Weight: 150 lb 11.2 oz (68.4 kg)  Height: '5\' 7"'$  (1.702 m)   Body mass index is 23.6 kg/m.  Physical Exam  Constitutional: She appears well-developed.  HENT:  Head: Normocephalic.  Eyes: Pupils are equal, round, and reactive to light.  Neck: Normal range of motion.  Cardiovascular: Normal rate.  Pulses:      Radial pulses are 2+ on the right side, and 2+ on the left side.  Pulmonary/Chest: Effort normal.  Abdominal: Soft.  Musculoskeletal: She exhibits edema.  Edema confined to left arm with peau d'orange appearance  Neurological: She is alert.  Skin: Skin is warm and dry. No erythema.  Psychiatric: She has a normal mood and affect. Her behavior is normal. Judgment and thought content normal.    Data: I reviewed her previous venous Doppler study which demonstrated small volume thrombus in the left subclavian vein and innominate vein on the left.  Her other deep veins of her left upper extremity were patent with expected signals relative to central occlusion.     Assessment/Plan:    67 year old female with  history of short gut syndrome and multiple bilateral upper extremity PICC line is in catheter for TPN.  Currently gets TPN via left IJ catheter.  She had acute swelling of her left upper extremity and breast in March which is not yet fully resolved but is not terribly life limiting at this point.  I have offered her central venogram via both the left upper extremity and possibly femoral approach if she were to want intervention which could be complicated by the presence of the PICC line which she needs as her lifeline.  I have discussed with her that she may be getting towards the end of her upper extremity access points and may require lower extremity access at some point and she is a good understanding of this.  At this time she wants no intervention but will call to schedule if she changes her mind.  We would need to have a Xarelto for 1 day prior to the procedure.    Waynetta Sandy MD [facility]

## 2017-10-19 ENCOUNTER — Other Ambulatory Visit (INDEPENDENT_AMBULATORY_CARE_PROVIDER_SITE_OTHER): Payer: Medicare Other

## 2017-10-19 ENCOUNTER — Other Ambulatory Visit: Payer: Self-pay

## 2017-10-19 DIAGNOSIS — K6389 Other specified diseases of intestine: Secondary | ICD-10-CM

## 2017-10-19 DIAGNOSIS — K912 Postsurgical malabsorption, not elsewhere classified: Secondary | ICD-10-CM

## 2017-10-19 LAB — CBC WITH DIFFERENTIAL/PLATELET
BASOS ABS: 0 10*3/uL (ref 0.0–0.1)
Basophils Relative: 0.8 % (ref 0.0–3.0)
EOS ABS: 0.5 10*3/uL (ref 0.0–0.7)
Eosinophils Relative: 10.3 % — ABNORMAL HIGH (ref 0.0–5.0)
HCT: 32.5 % — ABNORMAL LOW (ref 36.0–46.0)
Hemoglobin: 10.7 g/dL — ABNORMAL LOW (ref 12.0–15.0)
LYMPHS ABS: 2 10*3/uL (ref 0.7–4.0)
LYMPHS PCT: 39.5 % (ref 12.0–46.0)
MCHC: 33 g/dL (ref 30.0–36.0)
MCV: 96.3 fl (ref 78.0–100.0)
Monocytes Absolute: 0.6 10*3/uL (ref 0.1–1.0)
Monocytes Relative: 12.8 % — ABNORMAL HIGH (ref 3.0–12.0)
NEUTROS ABS: 1.8 10*3/uL (ref 1.4–7.7)
NEUTROS PCT: 36.6 % — AB (ref 43.0–77.0)
PLATELETS: 128 10*3/uL — AB (ref 150.0–400.0)
RBC: 3.37 Mil/uL — AB (ref 3.87–5.11)
RDW: 15.3 % (ref 11.5–15.5)
WBC: 4.9 10*3/uL (ref 4.0–10.5)

## 2017-10-19 LAB — COMPREHENSIVE METABOLIC PANEL
ALT: 15 U/L (ref 0–35)
AST: 16 U/L (ref 0–37)
Albumin: 3.3 g/dL — ABNORMAL LOW (ref 3.5–5.2)
Alkaline Phosphatase: 72 U/L (ref 39–117)
BUN: 13 mg/dL (ref 6–23)
CO2: 23 meq/L (ref 19–32)
CREATININE: 0.88 mg/dL (ref 0.40–1.20)
Calcium: 8.5 mg/dL (ref 8.4–10.5)
Chloride: 109 mEq/L (ref 96–112)
GFR: 68.03 mL/min (ref >60.00)
GLUCOSE: 79 mg/dL (ref 70–99)
Potassium: 3.6 mEq/L (ref 3.5–5.1)
Sodium: 139 mEq/L (ref 135–145)
TOTAL PROTEIN: 7.1 g/dL (ref 6.0–8.3)
Total Bilirubin: 0.5 mg/dL (ref 0.2–1.2)

## 2017-10-19 LAB — PHOSPHORUS: PHOSPHORUS: 3.6 mg/dL (ref 2.3–4.6)

## 2017-10-19 LAB — C-REACTIVE PROTEIN: CRP: 0.1 mg/dL — ABNORMAL LOW (ref 0.5–20.0)

## 2017-10-19 LAB — MAGNESIUM: MAGNESIUM: 1.5 mg/dL (ref 1.5–2.5)

## 2017-10-20 LAB — PREALBUMIN: Prealbumin: 16 mg/dL — ABNORMAL LOW (ref 17–34)

## 2017-11-09 ENCOUNTER — Other Ambulatory Visit (INDEPENDENT_AMBULATORY_CARE_PROVIDER_SITE_OTHER): Payer: Medicare Other

## 2017-11-09 DIAGNOSIS — K6389 Other specified diseases of intestine: Secondary | ICD-10-CM | POA: Diagnosis not present

## 2017-11-09 DIAGNOSIS — K912 Postsurgical malabsorption, not elsewhere classified: Secondary | ICD-10-CM | POA: Diagnosis not present

## 2017-11-09 LAB — COMPREHENSIVE METABOLIC PANEL
ALBUMIN: 3.5 g/dL (ref 3.5–5.2)
ALK PHOS: 80 U/L (ref 39–117)
ALT: 17 U/L (ref 0–35)
AST: 18 U/L (ref 0–37)
BUN: 11 mg/dL (ref 6–23)
CHLORIDE: 110 meq/L (ref 96–112)
CO2: 22 meq/L (ref 19–32)
Calcium: 8.6 mg/dL (ref 8.4–10.5)
Creatinine, Ser: 0.95 mg/dL (ref 0.40–1.20)
GFR: 62.27 mL/min (ref >60.00)
GLUCOSE: 83 mg/dL (ref 70–99)
POTASSIUM: 3.6 meq/L (ref 3.5–5.1)
SODIUM: 138 meq/L (ref 135–145)
Total Bilirubin: 0.5 mg/dL (ref 0.2–1.2)
Total Protein: 7.4 g/dL (ref 6.0–8.3)

## 2017-11-09 LAB — CBC WITH DIFFERENTIAL/PLATELET
BASOS PCT: 1.3 % (ref 0.0–3.0)
Basophils Absolute: 0.1 10*3/uL (ref 0.0–0.1)
EOS ABS: 0.4 10*3/uL (ref 0.0–0.7)
EOS PCT: 7.5 % — AB (ref 0.0–5.0)
HCT: 35 % — ABNORMAL LOW (ref 36.0–46.0)
HEMOGLOBIN: 11.4 g/dL — AB (ref 12.0–15.0)
LYMPHS ABS: 2.1 10*3/uL (ref 0.7–4.0)
Lymphocytes Relative: 37.4 % (ref 12.0–46.0)
MCHC: 32.6 g/dL (ref 30.0–36.0)
MCV: 97.7 fl (ref 78.0–100.0)
MONO ABS: 0.7 10*3/uL (ref 0.1–1.0)
Monocytes Relative: 12.6 % — ABNORMAL HIGH (ref 3.0–12.0)
NEUTROS ABS: 2.3 10*3/uL (ref 1.4–7.7)
Neutrophils Relative %: 41.2 % — ABNORMAL LOW (ref 43.0–77.0)
PLATELETS: 143 10*3/uL — AB (ref 150.0–400.0)
RBC: 3.59 Mil/uL — ABNORMAL LOW (ref 3.87–5.11)
RDW: 15.1 % (ref 11.5–15.5)
WBC: 5.6 10*3/uL (ref 4.0–10.5)

## 2017-11-09 LAB — C-REACTIVE PROTEIN: CRP: 0.1 mg/dL — ABNORMAL LOW (ref 0.5–20.0)

## 2017-11-09 LAB — PHOSPHORUS: Phosphorus: 3.8 mg/dL (ref 2.3–4.6)

## 2017-11-09 LAB — MAGNESIUM: MAGNESIUM: 1.6 mg/dL (ref 1.5–2.5)

## 2017-11-10 LAB — PREALBUMIN: PREALBUMIN: 17 mg/dL (ref 17–34)

## 2017-11-16 NOTE — Progress Notes (Signed)
Labs ok.

## 2017-11-19 ENCOUNTER — Ambulatory Visit (INDEPENDENT_AMBULATORY_CARE_PROVIDER_SITE_OTHER): Payer: Medicare Other | Admitting: Family Medicine

## 2017-11-19 ENCOUNTER — Other Ambulatory Visit: Payer: Self-pay

## 2017-11-19 ENCOUNTER — Encounter: Payer: Self-pay | Admitting: Gynecology

## 2017-11-19 ENCOUNTER — Encounter: Payer: Self-pay | Admitting: Family Medicine

## 2017-11-19 ENCOUNTER — Ambulatory Visit: Payer: Medicare Other | Admitting: Gynecology

## 2017-11-19 ENCOUNTER — Ambulatory Visit (INDEPENDENT_AMBULATORY_CARE_PROVIDER_SITE_OTHER): Payer: Medicare Other | Admitting: Gynecology

## 2017-11-19 VITALS — BP 118/76 | Ht 67.0 in | Wt 145.0 lb

## 2017-11-19 VITALS — BP 104/62 | HR 58 | Temp 98.0°F | Resp 16 | Ht 67.0 in | Wt 145.0 lb

## 2017-11-19 DIAGNOSIS — N764 Abscess of vulva: Secondary | ICD-10-CM

## 2017-11-19 DIAGNOSIS — N751 Abscess of Bartholin's gland: Secondary | ICD-10-CM

## 2017-11-19 MED ORDER — DOXYCYCLINE HYCLATE 100 MG PO TABS: 100.0000 mg | ORAL_TABLET | Freq: Two times a day (BID) | ORAL | 0 refills | Status: DC

## 2017-11-19 NOTE — Patient Instructions (Signed)
Follow-up if this area does not completely heal over the next week or so.

## 2017-11-19 NOTE — Patient Instructions (Signed)
Follow up as needed or as scheduled We'll call you with your GYN appt START the Doxycycline twice daily- w/ food Call with any questions or concerns Hang in there!!!

## 2017-11-19 NOTE — Progress Notes (Signed)
   Subjective:    Patient ID: Kristee C Cobert, female    DOB: [birth year], 67 y.o.   MRN: [id number]  HPI Abscess- first noted 4-5 days ago.  Located on perineum.  'very uncomfortable'.  Mild drainage.  Pt tried to squeeze the area and this worsened things.  Some improvement w/ ice pack.   Review of Systems For ROS see HPI     Objective:   Physical Exam  Constitutional: She is oriented to person, place, and time. She appears well-developed and well-nourished. No distress.  HENT:  Head: Normocephalic and atraumatic.  Genitourinary:  Genitourinary Comments: Indurated, oozing Bartholin's abscess of L labia  Neurological: She is alert and oriented to person, place, and time.  Skin: Skin is warm and dry.  Psychiatric: She has a normal mood and affect. Her behavior is normal. Thought content normal.  Vitals reviewed.         Assessment & Plan:  Bartholin's abscess- new.  Pt's sxs and PE consistent w/ abscess.  Given that she has a PICC line, will start abx and refer to GYN.  Pt expressed understanding and is in agreement w/ plan.

## 2017-11-19 NOTE — Progress Notes (Signed)
    [name] [name] 09-25-1950 [id number]        67 y.o.  G2P2 new patient presents with a 5-day history of left vulvar swelling and discomfort.  Ultimately started draining over the last 24 hours.  Saw [name] who felt she had a Bartholin abscess and started her on doxycycline twice daily.  She has a PICC line in place.  No fever or chills.  No history of any gynecologic abnormalities in the past.  Reports routine gynecologic follow-up with her other physician.  Past medical history,surgical history, problem list, medications, allergies, family history and social history were all reviewed and documented in the EPIC chart.  Directed ROS with pertinent positives and negatives documented in the history of present illness/assessment and plan.  Exam: Caryn Bee assistant Vitals:   11/19/17 1441  BP: 118/76  Weight: 145 lb (65.8 kg)  Height: 5\' 7"  (1.702 m)   General appearance:  Normal Abdomen soft nontender without masses guarding rebound Pelvic external BUS vagina with atrophic changes.  Draining area left lower labia majora.  Some surrounding induration but no fluctuance or significant masses.  No significant tenderness.  No inguinal adenopathy.  Cervix with atrophic changes.  Uterus normal size midline mobile nontender.  Adnexa without masses or tenderness. Rectal exam is normal  Assessment/Plan:  67 y.o. G2P2 with draining vulvar abscess with residual induration but no residual fluctuance to suggest need for I&D.  No cellulitis or inguinal adenopathy.  Discussed differential to include possible atypical Bartholin gland abscess noting it is a little lower and draining externally and not into the posterior fourchette as would be expected with a Bartholin's.  Discussed perirectal abscess possibilities but she has no history of this before and would be unusual to present now.  Doubt is going to become a recurrent issue.  Patient is going to start on doxycycline which I think is appropriate given  her PICC line to help prevent superinfection but I think from a vulvar abscess standpoint it is treated with drainage.  We also discussed sitz bath for the next couple days.  She will follow-up if this area does not completely heal up over the next week or so.  I spent a total of 20 face-to-face minutes with the patient, over 50% was spent counseling and coordination of care.     Anastasio Auerbach MD, 3:10 PM 11/19/2017

## 2017-12-14 ENCOUNTER — Other Ambulatory Visit (INDEPENDENT_AMBULATORY_CARE_PROVIDER_SITE_OTHER): Payer: Medicare Other

## 2017-12-14 DIAGNOSIS — K912 Postsurgical malabsorption, not elsewhere classified: Secondary | ICD-10-CM | POA: Diagnosis not present

## 2017-12-14 DIAGNOSIS — K6389 Other specified diseases of intestine: Secondary | ICD-10-CM | POA: Diagnosis not present

## 2017-12-14 LAB — CBC WITH DIFFERENTIAL/PLATELET
BASOS PCT: 1 % (ref 0.0–3.0)
Basophils Absolute: 0 10*3/uL (ref 0.0–0.1)
EOS ABS: 0.4 10*3/uL (ref 0.0–0.7)
Eosinophils Relative: 9.2 % — ABNORMAL HIGH (ref 0.0–5.0)
HCT: 33.6 % — ABNORMAL LOW (ref 36.0–46.0)
Hemoglobin: 11.1 g/dL — ABNORMAL LOW (ref 12.0–15.0)
LYMPHS ABS: 1.8 10*3/uL (ref 0.7–4.0)
Lymphocytes Relative: 42.1 % (ref 12.0–46.0)
MCHC: 33.2 g/dL (ref 30.0–36.0)
MCV: 97.1 fl (ref 78.0–100.0)
MONO ABS: 0.6 10*3/uL (ref 0.1–1.0)
Monocytes Relative: 13 % — ABNORMAL HIGH (ref 3.0–12.0)
NEUTROS ABS: 1.5 10*3/uL (ref 1.4–7.7)
NEUTROS PCT: 34.7 % — AB (ref 43.0–77.0)
Platelets: 124 10*3/uL — ABNORMAL LOW (ref 150.0–400.0)
RBC: 3.46 Mil/uL — ABNORMAL LOW (ref 3.87–5.11)
RDW: 14.5 % (ref 11.5–15.5)
WBC: 4.3 10*3/uL (ref 4.0–10.5)

## 2017-12-14 LAB — COMPREHENSIVE METABOLIC PANEL
ALT: 13 U/L (ref 0–35)
AST: 16 U/L (ref 0–37)
Albumin: 3.4 g/dL — ABNORMAL LOW (ref 3.5–5.2)
Alkaline Phosphatase: 68 U/L (ref 39–117)
BUN: 14 mg/dL (ref 6–23)
CO2: 24 mEq/L (ref 19–32)
Calcium: 8.8 mg/dL (ref 8.4–10.5)
Chloride: 107 mEq/L (ref 96–112)
Creatinine, Ser: 1.07 mg/dL (ref 0.40–1.20)
GFR: 54.26 mL/min — ABNORMAL LOW (ref >60.00)
Glucose, Bld: 85 mg/dL (ref 70–99)
Potassium: 3.7 mEq/L (ref 3.5–5.1)
Sodium: 139 mEq/L (ref 135–145)
Total Bilirubin: 0.5 mg/dL (ref 0.2–1.2)
Total Protein: 7.4 g/dL (ref 6.0–8.3)

## 2017-12-14 LAB — PHOSPHORUS: Phosphorus: 3.8 mg/dL (ref 2.3–4.6)

## 2017-12-14 LAB — C-REACTIVE PROTEIN: CRP: 0.1 mg/dL — ABNORMAL LOW (ref 0.5–20.0)

## 2017-12-14 LAB — MAGNESIUM: Magnesium: 1.6 mg/dL (ref 1.5–2.5)

## 2017-12-15 LAB — PREALBUMIN: Prealbumin: 16 mg/dL — ABNORMAL LOW (ref 17–34)

## 2017-12-18 NOTE — Progress Notes (Signed)
Labs ok overall

## 2017-12-29 ENCOUNTER — Ambulatory Visit (HOSPITAL_BASED_OUTPATIENT_CLINIC_OR_DEPARTMENT_OTHER)
Admission: RE | Admit: 2017-12-29 | Discharge: 2017-12-29 | Disposition: A | Discharge status: Home / Self Care | Payer: Medicare Other | Source: Ambulatory Visit | Attending: Cardiovascular Disease | Admitting: Cardiovascular Disease

## 2017-12-29 ENCOUNTER — Encounter (HOSPITAL_COMMUNITY): Payer: Medicare Other

## 2017-12-29 DIAGNOSIS — I5042 Chronic combined systolic (congestive) and diastolic (congestive) heart failure: Secondary | ICD-10-CM | POA: Diagnosis not present

## 2017-12-29 DIAGNOSIS — S79912A Unspecified injury of left hip, initial encounter: Secondary | ICD-10-CM | POA: Diagnosis not present

## 2017-12-29 DIAGNOSIS — I739 Peripheral vascular disease, unspecified: Secondary | ICD-10-CM

## 2017-12-29 DIAGNOSIS — S7222XA Displaced subtrochanteric fracture of left femur, initial encounter for closed fracture: Secondary | ICD-10-CM | POA: Diagnosis not present

## 2017-12-29 DIAGNOSIS — R002 Palpitations: Secondary | ICD-10-CM | POA: Diagnosis not present

## 2017-12-29 DIAGNOSIS — R0989 Other specified symptoms and signs involving the circulatory and respiratory systems: Secondary | ICD-10-CM

## 2017-12-29 DIAGNOSIS — M25552 Pain in left hip: Secondary | ICD-10-CM | POA: Diagnosis not present

## 2017-12-29 DIAGNOSIS — E441 Mild protein-calorie malnutrition: Secondary | ICD-10-CM | POA: Diagnosis not present

## 2017-12-29 DIAGNOSIS — D6859 Other primary thrombophilia: Secondary | ICD-10-CM | POA: Diagnosis not present

## 2017-12-29 DIAGNOSIS — S72142A Displaced intertrochanteric fracture of left femur, initial encounter for closed fracture: Secondary | ICD-10-CM | POA: Diagnosis not present

## 2017-12-29 DIAGNOSIS — I779 Disorder of arteries and arterioles, unspecified: Secondary | ICD-10-CM | POA: Insufficient documentation

## 2017-12-29 DIAGNOSIS — S72145A Nondisplaced intertrochanteric fracture of left femur, initial encounter for closed fracture: Secondary | ICD-10-CM | POA: Diagnosis not present

## 2017-12-29 DIAGNOSIS — Z452 Encounter for adjustment and management of vascular access device: Secondary | ICD-10-CM | POA: Diagnosis not present

## 2017-12-29 DIAGNOSIS — I2510 Atherosclerotic heart disease of native coronary artery without angina pectoris: Secondary | ICD-10-CM | POA: Diagnosis not present

## 2017-12-29 DIAGNOSIS — Z09 Encounter for follow-up examination after completed treatment for conditions other than malignant neoplasm: Secondary | ICD-10-CM | POA: Diagnosis not present

## 2017-12-29 DIAGNOSIS — K912 Postsurgical malabsorption, not elsewhere classified: Secondary | ICD-10-CM | POA: Diagnosis not present

## 2017-12-30 ENCOUNTER — Encounter (HOSPITAL_BASED_OUTPATIENT_CLINIC_OR_DEPARTMENT_OTHER): Payer: Self-pay

## 2017-12-30 ENCOUNTER — Inpatient Hospital Stay (HOSPITAL_BASED_OUTPATIENT_CLINIC_OR_DEPARTMENT_OTHER)
Admission: EM | Admit: 2017-12-30 | Discharge: 2018-01-01 | DRG: 481 | Disposition: A | Discharge status: Home / Self Care | Payer: Medicare Other | Attending: Family Medicine | Admitting: Family Medicine

## 2017-12-30 ENCOUNTER — Inpatient Hospital Stay (HOSPITAL_COMMUNITY): Payer: Medicare Other

## 2017-12-30 ENCOUNTER — Other Ambulatory Visit: Payer: Self-pay

## 2017-12-30 ENCOUNTER — Emergency Department (HOSPITAL_BASED_OUTPATIENT_CLINIC_OR_DEPARTMENT_OTHER): Payer: Medicare Other

## 2017-12-30 DIAGNOSIS — Z91041 Radiographic dye allergy status: Secondary | ICD-10-CM

## 2017-12-30 DIAGNOSIS — S72145A Nondisplaced intertrochanteric fracture of left femur, initial encounter for closed fracture: Secondary | ICD-10-CM | POA: Diagnosis not present

## 2017-12-30 DIAGNOSIS — Z87891 Personal history of nicotine dependence: Secondary | ICD-10-CM

## 2017-12-30 DIAGNOSIS — Z79899 Other long term (current) drug therapy: Secondary | ICD-10-CM

## 2017-12-30 DIAGNOSIS — Z7901 Long term (current) use of anticoagulants: Secondary | ICD-10-CM | POA: Diagnosis not present

## 2017-12-30 DIAGNOSIS — M810 Age-related osteoporosis without current pathological fracture: Secondary | ICD-10-CM | POA: Diagnosis not present

## 2017-12-30 DIAGNOSIS — S72002A Fracture of unspecified part of neck of left femur, initial encounter for closed fracture: Secondary | ICD-10-CM | POA: Diagnosis not present

## 2017-12-30 DIAGNOSIS — Z6821 Body mass index (BMI) 21.0-21.9, adult: Secondary | ICD-10-CM | POA: Diagnosis not present

## 2017-12-30 DIAGNOSIS — K219 Gastro-esophageal reflux disease without esophagitis: Secondary | ICD-10-CM | POA: Diagnosis not present

## 2017-12-30 DIAGNOSIS — S7222XA Displaced subtrochanteric fracture of left femur, initial encounter for closed fracture: Secondary | ICD-10-CM | POA: Diagnosis not present

## 2017-12-30 DIAGNOSIS — Z880 Allergy status to penicillin: Secondary | ICD-10-CM | POA: Diagnosis not present

## 2017-12-30 DIAGNOSIS — Z452 Encounter for adjustment and management of vascular access device: Secondary | ICD-10-CM

## 2017-12-30 DIAGNOSIS — Z09 Encounter for follow-up examination after completed treatment for conditions other than malignant neoplasm: Secondary | ICD-10-CM | POA: Diagnosis not present

## 2017-12-30 DIAGNOSIS — I82622 Acute embolism and thrombosis of deep veins of left upper extremity: Secondary | ICD-10-CM

## 2017-12-30 DIAGNOSIS — S79912A Unspecified injury of left hip, initial encounter: Secondary | ICD-10-CM | POA: Diagnosis not present

## 2017-12-30 DIAGNOSIS — E861 Hypovolemia: Secondary | ICD-10-CM

## 2017-12-30 DIAGNOSIS — I9589 Other hypotension: Secondary | ICD-10-CM | POA: Diagnosis not present

## 2017-12-30 DIAGNOSIS — Z01811 Encounter for preprocedural respiratory examination: Secondary | ICD-10-CM

## 2017-12-30 DIAGNOSIS — I2510 Atherosclerotic heart disease of native coronary artery without angina pectoris: Secondary | ICD-10-CM | POA: Diagnosis present

## 2017-12-30 DIAGNOSIS — K909 Intestinal malabsorption, unspecified: Secondary | ICD-10-CM | POA: Diagnosis not present

## 2017-12-30 DIAGNOSIS — I5022 Chronic systolic (congestive) heart failure: Secondary | ICD-10-CM | POA: Diagnosis not present

## 2017-12-30 DIAGNOSIS — S72009A Fracture of unspecified part of neck of unspecified femur, initial encounter for closed fracture: Secondary | ICD-10-CM | POA: Diagnosis present

## 2017-12-30 DIAGNOSIS — I5042 Chronic combined systolic (congestive) and diastolic (congestive) heart failure: Secondary | ICD-10-CM | POA: Diagnosis not present

## 2017-12-30 DIAGNOSIS — E441 Mild protein-calorie malnutrition: Secondary | ICD-10-CM | POA: Diagnosis not present

## 2017-12-30 DIAGNOSIS — D638 Anemia in other chronic diseases classified elsewhere: Secondary | ICD-10-CM | POA: Diagnosis present

## 2017-12-30 DIAGNOSIS — Z7982 Long term (current) use of aspirin: Secondary | ICD-10-CM | POA: Diagnosis not present

## 2017-12-30 DIAGNOSIS — R627 Adult failure to thrive: Secondary | ICD-10-CM | POA: Diagnosis present

## 2017-12-30 DIAGNOSIS — M25552 Pain in left hip: Secondary | ICD-10-CM | POA: Diagnosis not present

## 2017-12-30 DIAGNOSIS — K912 Postsurgical malabsorption, not elsewhere classified: Secondary | ICD-10-CM | POA: Diagnosis not present

## 2017-12-30 DIAGNOSIS — W010XXA Fall on same level from slipping, tripping and stumbling without subsequent striking against object, initial encounter: Secondary | ICD-10-CM | POA: Diagnosis present

## 2017-12-30 DIAGNOSIS — D6859 Other primary thrombophilia: Secondary | ICD-10-CM

## 2017-12-30 DIAGNOSIS — I959 Hypotension, unspecified: Secondary | ICD-10-CM | POA: Diagnosis present

## 2017-12-30 DIAGNOSIS — S72142A Displaced intertrochanteric fracture of left femur, initial encounter for closed fracture: Secondary | ICD-10-CM | POA: Diagnosis not present

## 2017-12-30 DIAGNOSIS — R001 Bradycardia, unspecified: Secondary | ICD-10-CM | POA: Diagnosis present

## 2017-12-30 DIAGNOSIS — Z01818 Encounter for other preprocedural examination: Secondary | ICD-10-CM | POA: Diagnosis not present

## 2017-12-30 DIAGNOSIS — I110 Hypertensive heart disease with heart failure: Secondary | ICD-10-CM | POA: Diagnosis not present

## 2017-12-30 DIAGNOSIS — T82520A Displacement of surgically created arteriovenous fistula, initial encounter: Secondary | ICD-10-CM | POA: Diagnosis not present

## 2017-12-30 DIAGNOSIS — R002 Palpitations: Secondary | ICD-10-CM | POA: Diagnosis not present

## 2017-12-30 DIAGNOSIS — I82409 Acute embolism and thrombosis of unspecified deep veins of unspecified lower extremity: Secondary | ICD-10-CM | POA: Diagnosis present

## 2017-12-30 DIAGNOSIS — Z01810 Encounter for preprocedural cardiovascular examination: Secondary | ICD-10-CM | POA: Diagnosis not present

## 2017-12-30 DIAGNOSIS — Z472 Encounter for removal of internal fixation device: Secondary | ICD-10-CM | POA: Diagnosis not present

## 2017-12-30 LAB — TROPONIN I
Troponin I: 0.1 ng/mL (ref <0.03)
Troponin I: 0.09 ng/mL (ref <0.03)

## 2017-12-30 LAB — CBC
HCT: 33.8 % — ABNORMAL LOW (ref 36.0–46.0)
Hemoglobin: 11 g/dL — ABNORMAL LOW (ref 12.0–15.0)
MCH: 31.7 pg (ref 26.0–34.0)
MCHC: 32.5 g/dL (ref 30.0–36.0)
MCV: 97.4 fL (ref 78.0–100.0)
PLATELETS: 123 10*3/uL — AB (ref 150–400)
RBC: 3.47 MIL/uL — ABNORMAL LOW (ref 3.87–5.11)
RDW: 14.9 % (ref 11.5–15.5)
WBC: 7.1 10*3/uL (ref 4.0–10.5)

## 2017-12-30 LAB — BASIC METABOLIC PANEL
Anion gap: 8 (ref 5–15)
BUN: 20 mg/dL (ref 8–23)
CALCIUM: 8.3 mg/dL — AB (ref 8.9–10.3)
CO2: 21 mmol/L — ABNORMAL LOW (ref 22–32)
CREATININE: 0.96 mg/dL (ref 0.44–1.00)
Chloride: 108 mmol/L (ref 98–111)
GFR calc Af Amer: >60 mL/min (ref >60)
GFR, EST NON AFRICAN AMERICAN: 60 mL/min — AB (ref >60)
GLUCOSE: 94 mg/dL (ref 70–99)
Potassium: 4.4 mmol/L (ref 3.5–5.1)
Sodium: 137 mmol/L (ref 135–145)

## 2017-12-30 LAB — MAGNESIUM: Magnesium: 1.8 mg/dL (ref 1.7–2.4)

## 2017-12-30 MED ORDER — SODIUM CHLORIDE 0.9 % IV SOLN
INTRAVENOUS | Status: DC
  Administered 2017-12-30: 23:00:00 via INTRAVENOUS

## 2017-12-30 MED ORDER — PANTOPRAZOLE SODIUM 40 MG PO TBEC
40.0000 mg | DELAYED_RELEASE_TABLET | Freq: Every day | ORAL | Status: DC
  Administered 2017-12-30 – 2018-01-01 (×3): 40 mg via ORAL
  Filled 2017-12-30 (×3): qty 1

## 2017-12-30 MED ORDER — FENTANYL CITRATE (PF) 100 MCG/2ML IJ SOLN
50.0000 ug | Freq: Once | INTRAMUSCULAR | Status: AC
  Administered 2017-12-30: 50 ug via INTRAVENOUS
  Filled 2017-12-30: qty 2

## 2017-12-30 MED ORDER — ONDANSETRON HCL 4 MG/2ML IJ SOLN
4.0000 mg | Freq: Once | INTRAMUSCULAR | Status: AC
  Administered 2017-12-30: 4 mg via INTRAVENOUS
  Filled 2017-12-30: qty 2

## 2017-12-30 MED ORDER — MORPHINE SULFATE (PF) 4 MG/ML IV SOLN
4.0000 mg | Freq: Once | INTRAVENOUS | Status: AC
  Administered 2017-12-30: 4 mg via INTRAVENOUS
  Filled 2017-12-30: qty 1

## 2017-12-30 MED ORDER — NON FORMULARY: 1800.0000 mL | Status: DC

## 2017-12-30 MED ORDER — MAGNESIUM SULFATE IN D5W 1-5 GM/100ML-% IV SOLN
1.0000 g | Freq: Once | INTRAVENOUS | Status: AC
  Administered 2017-12-30: 1 g via INTRAVENOUS
  Filled 2017-12-30: qty 100

## 2017-12-30 MED ORDER — METHOCARBAMOL 1000 MG/10ML IJ SOLN
500.0000 mg | Freq: Four times a day (QID) | INTRAVENOUS | Status: DC | PRN
  Filled 2017-12-30: qty 5

## 2017-12-30 MED ORDER — ALBUTEROL SULFATE (2.5 MG/3ML) 0.083% IN NEBU: 2.5000 mg | INHALATION_SOLUTION | Freq: Four times a day (QID) | RESPIRATORY_TRACT | Status: DC | PRN

## 2017-12-30 MED ORDER — NONFORMULARY OR COMPOUNDED ITEM: 1800.0000 mL | Status: DC

## 2017-12-30 MED ORDER — LACTATED RINGERS IV BOLUS
500.0000 mL | Freq: Once | INTRAVENOUS | Status: AC
  Administered 2017-12-30: 500 mL via INTRAVENOUS

## 2017-12-30 MED ORDER — HYDROCODONE-ACETAMINOPHEN 5-325 MG PO TABS
1.0000 | ORAL_TABLET | Freq: Four times a day (QID) | ORAL | Status: DC | PRN
  Administered 2017-12-30 – 2017-12-31 (×2): 1 via ORAL
  Filled 2017-12-30 (×2): qty 1

## 2017-12-30 MED ORDER — FENTANYL CITRATE (PF) 100 MCG/2ML IJ SOLN
50.0000 ug | INTRAMUSCULAR | Status: DC | PRN
  Administered 2017-12-31 (×2): 50 ug via INTRAVENOUS
  Filled 2017-12-30 (×2): qty 2

## 2017-12-30 MED ORDER — METHOCARBAMOL 500 MG PO TABS
500.0000 mg | ORAL_TABLET | Freq: Four times a day (QID) | ORAL | Status: DC | PRN
  Administered 2017-12-30 – 2017-12-31 (×2): 500 mg via ORAL
  Filled 2017-12-30 (×2): qty 1

## 2017-12-30 NOTE — Progress Notes (Signed)
Order for PICC exchange noted. Contacted CC RN to let her know only interventional radiology can do her PICC's and central lines due to a gross number of venous occlusions. Requested that IV Team order be discontinued and order for IR Central line exchange/placement be entered.

## 2017-12-30 NOTE — ED Provider Notes (Signed)
[facility] EMERGENCY DEPARTMENT Provider Note   CSN: [id number] Arrival date & time: 12/30/17  1332     History   Chief Complaint Chief Complaint  Patient presents with  . Fall    HPI [name] [name] is a 67 y.o. female.  HPI 67 year old female presents the emergency department complaints of left hip pain after a fall 1 hour ago.  She tripped and fell over her own feet per the patient.  She fell and injured her left hip with radiation down towards her left knee.  No head injury.  No chest or abdominal pain.  No back pain.  Pain in her left hip is mild to moderate in severity and worse with palpation of her left lateral hip.  She has had prior hip surgery.  No shortening or external rotation on arrival to the ER.   Past Medical History:  Diagnosis Date  . Abnormal LFTs 2013  . Allergic rhinosinusitis   . Anemia of chronic disease   . At risk for dental problems   . Atypical nevus   . Bacteremia due to Klebsiella pneumoniae 12/12/2011  . Bacterial overgrowth syndrome   . 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 Carotid stenosis    Carotid US (9/15):  R 40-59%; L 1-39% >> FU 1 year  . Congestive heart failure (CHF) (HCC)    EF 25-30%  . 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 Fracture of left clavicle   . History of blood transfusion 2013   anemia  . Hx of cardiovascular stress test    Myoview (9/15):  inf-apical scar; no ischemia; EF 47% - low risk   . 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 Osteoporosis   . Pancytopenia 10/07/2011  . Pathologic fracture of neck of femur ([place])   . Personal history of colonic polyps   . Renal insufficiency    hx of yrs ago  . Serratia marcescens infection - bactermia assoc w/ PICC 01/18/2015  . 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 Splenomegaly    By ultrasound  . Thrombophilia ([place])   . Vitamin D deficiency     Patient Active Problem List   Diagnosis Date Noted  . 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 Pressure injury of skin 07/09/2016  . Degenerative joint disease 07/08/2016  . Mild malnutrition ([place]) 05/08/2016  . 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 Splenomegaly   . Hx of adenomatous colonic polyps 04/18/2012  . Chronic systolic heart failure ([place]) 12/25/2011  . Nutritional deficiency 06/04/2011  . Osteoporosis 03/14/2010  . Anemia of chronic disease 10/01/2009  . GERD (gastroesophageal reflux disease) 11/19/2008  . ALKALINE PHOSPHATASE, ELEVATED 10/12/2008  . VITAMIN D DEFICIENCY 01/19/2008  . TRANSAMINASES, SERUM, ELEVATED 01/19/2008  . Bacterial overgrowth syndrome 11/19/2004  . Acquired short bowel syndrome 11/20/2003    Past Surgical History:  Procedure Laterality Date  . APPENDECTOMY  yrs ago  . CHOLECYSTECTOMY  yrs ago  . COLONOSCOPY  12/05/2005   internal hemorrhoids (for polyp surveillance)  . COLONOSCOPY  04/26/2012   Procedure: COLONOSCOPY;  Surgeon: Gatha Mayer, MD;  Location: [facility];  Service: Endoscopy;  Laterality: N/A;  . COLONOSCOPY WITH PROPOFOL N/A 03/18/2016   Procedure: COLONOSCOPY WITH PROPOFOL;  Surgeon: Gatha Mayer, MD;  Location: [facility];  Service: Endoscopy;  Laterality: N/A;  . ESOPHAGOGASTRODUODENOSCOPY  01/22/2009   erosive esophagitis  . IR GENERIC HISTORICAL  07/04/2016   IR REMOVAL TUN CV CATH W/O FL 07/04/2016 Ascencion Dike, PA-C [facility]  . IR GENERIC HISTORICAL  07/10/2016   IR US GUIDE VASC ACCESS LEFT 07/10/2016 Arne Cleveland, MD [facility]  . IR GENERIC HISTORICAL  07/10/2016   IR FLUORO GUIDE CV LINE LEFT 07/10/2016 Arne Cleveland, MD [facility]  . IR PATIENT EVAL TECH 0-60 MINS  10/07/2017  . IR RADIOLOGIST EVAL & MGMT  07/21/2017  . ORIF PROXIMAL FEMORAL FRACTURE W/ ITST NAIL SYSTEM  03/2007   left, Dr. Shellia Carwin  . SMALL INTESTINE SURGERY  2005   multiple with right colon  resection for ischemia/infarct     OB History    Gravida  2   Para  2   Term      Preterm      AB      Living  1     SAB      TAB      Ectopic      Multiple      Live Births               Home Medications    Prior to Admission medications   Medication Sig Start Date End Date Taking? Authorizing Provider  ADULT TPN 1,800 mLs 4 (four) times a week. Pt receives home TPN from [facility]:  1800 mL bag, four nights weekly (Monday, Wednesday, Thursday, and Friday) for 8 hours (includes 1 hour taper up and down).    [provider]  albuterol (PROVENTIL HFA;VENTOLIN HFA) 108 (90 Base) MCG/ACT inhaler Inhale 2 puffs into the lungs every 6 (six) hours as needed for wheezing or shortness of breath. 04/17/17   [name], MD  aspirin EC 81 MG tablet Take 1 tablet (81 mg total) by mouth daily. 11/17/13   Richardson Dopp T, PA-C  diphenhydrAMINE (BENADRYL) 25 mg capsule Take 25-50 mg by mouth every 6 (six) hours as needed for itching or sleep.     [provider]  diphenoxylate-atropine (LOMOTIL) 2.5-0.025 MG tablet Take 1 tablet by mouth 4 (four) times daily as needed for diarrhea or loose stools. 03/24/16   Gatha Mayer, MD  doxycycline (VIBRA-TABS) 100 MG tablet Take 1 tablet (100 mg total) by mouth 2 (two) times daily. 11/19/17   [name], MD  Multiple Vitamin (MULTIVITAMIN WITH MINERALS) TABS Take 1 tablet by mouth daily.    [provider]  omeprazole (PRILOSEC) 40 MG capsule TAKE 1 CAPSULE 2 (TWO) TIMES DAILY BY MOUTH. TAKE BEFORE BREAKFAST AND SUPPER 09/29/17   Gatha Mayer, MD  ondansetron (ZOFRAN-ODT) 8 MG disintegrating tablet DISSOLVE 1 TABLET BY MOUTH EVERY 8 HOURS AS NEEDED FOR NAUSEA OR VOMITING 30 MIN BEFORE INFUSION 06/03/17   Gatha Mayer, MD  rivaroxaban (XARELTO) 20 MG TABS tablet Take 1 tablet (20 mg total) by mouth daily with supper. 08/14/17   [name], MD  sodium chloride 0.9 % infusion Uses for TPN  10/27/17   [provider]  Teduglutide, rDNA, (GATTEX) 5 MG KIT Inject 3.3 Units into the skin daily. 09/11/14   Gatha Mayer, MD  vitamin E 400 UNIT capsule Take 400 Units by mouth daily.     [provider]    Family History Family History  Problem Relation Age of Onset  . Diabetes Mother   . Hypertension Mother   . AAA (abdominal aortic aneurysm) Mother   .  Colon cancer Neg Hx   . Stomach cancer Neg Hx     Social History Social History   Tobacco Use  . Smoking status: Former Smoker    Types: Cigarettes    Last attempt to quit: 05/12/2004    Years since quitting: 13.6  . Smokeless tobacco: Never Used  Substance Use Topics  . Alcohol use: No  . Drug use: No     Allergies   Iohexol and Penicillins   Review of Systems Review of Systems  All other systems reviewed and are negative.    Physical Exam Updated Vital Signs BP 118/70 (BP Location: Left Arm)   Pulse 68   Temp 98.1 F (36.7 C) (Oral)   Resp 20   Ht _0  (1.702 m)   Wt 63.5 kg   LMP 02/26/2014   SpO2 100%   BMI 21.93 kg/m   Physical Exam  Constitutional: She is oriented to person, place, and time. She appears well-developed and well-nourished. No distress.  HENT:  Head: Normocephalic and atraumatic.  Eyes: EOM are normal.  Neck: Normal range of motion.  Cardiovascular: Normal rate and regular rhythm.  Pulmonary/Chest: Effort normal and breath sounds normal.  Abdominal: Soft. She exhibits no distension. There is no tenderness.  Musculoskeletal: Normal range of motion.  No shortening or external rotation of the left lower extremity.  Tenderness of the left lateral hip without obvious deformity.  Able to range the left hip to 45 degrees before pain limits her.  No thoracic or lumbar tenderness.  Full range of motion of major joints of upper extremities.  Neurological: She is alert and oriented to person, place, and time.  Skin: Skin is warm and dry.  Psychiatric: She has a normal  mood and affect. Judgment normal.  Nursing note and vitals reviewed.    ED Treatments / Results  Labs (all labs ordered are listed, but only abnormal results are displayed) Labs Reviewed - No data to display  EKG None  Radiology Dg Hip Unilat W Or Wo Pelvis 2-3 Views Left  Result Date: 12/30/2017 CLINICAL DATA:  Fall.  Hip pain EXAM: DG HIP (WITH OR WITHOUT PELVIS) 2-3V LEFT COMPARISON:  None. FINDINGS: 3 pins in the left femoral neck. Avulsion fracture of the greater tuberosity appears acute. Fracture may extend across proximal femur into the lesser trochanter. Left hip joint normal.  No pelvic fracture.  Right hip normal. IMPRESSION: 3 pins in the left femoral neck. Acute nondisplaced fracture of the greater trochanter which may extend across the femur. Recommend CT left hip to further evaluate fracture. Electronically Signed   By: Franchot Gallo M.D.   On: 12/30/2017 14:45    Procedures Procedures (including critical care time)  Medications Ordered in ED Medications - No data to display   Initial Impression / Assessment and Plan / ED Course  I have reviewed the triage vital signs and the nursing notes.  Pertinent labs & imaging results that were available during my care of the patient were reviewed by me and considered in my medical decision making (see chart for details).     Possible periprosthetic left hip fracture.  CT imaging ordered for closer evaluation.  Pain treated.  N.p.o. status.  Baseline labs as the patient may require admission if this is a periprosthetic fracture.  Care to Dr Dolly Rias to follow up on images  Final Clinical Impressions(s) / ED Diagnoses   Final diagnoses:  None    ED Discharge Orders    None  Jola Schmidt, MD 12/30/17 1511

## 2017-12-30 NOTE — Progress Notes (Signed)
CRITICAL VALUE ALERT  Critical Value:  Troponin 1.0   Date & Time Notied:  2209  Provider Notified: [place]  Orders Received/Actions taken: None at this time

## 2017-12-30 NOTE — ED Triage Notes (Signed)
Pt tripped/fel approx 1 hour PTA-pain left hip, left knee-to triage in w/c-NAD

## 2017-12-30 NOTE — Progress Notes (Signed)
Report called from [facility]. Currently awaiting for [facility] transport.

## 2017-12-30 NOTE — Consult Note (Signed)
Reason for Consult:Left hip fracture Referring Physician: Grandville Silos, MD  [name] [name] is an 67 y.o. female.  HPI: 67 yo female with a history of prior left hip percutaneous pinning for a femoral neck fracture who presents after a mechanical fall while shopping today complaining of left hip pain. Patient initially presented to [facility] revealed a minimally displaced/non-displaced left intertrochanteric proximal femur fracture. She was transported to [facility] for definitive evaluation and treatment. Patient complains of lateral hip and groin pain with movement and unable to bear weight on the hip. Patient denies LOC and also denies other joint pains.   Past Medical History:  Diagnosis Date  . Abnormal LFTs 2013  . Allergic rhinosinusitis   . Anemia of chronic disease   . At risk for dental problems   . Atypical nevus   . Bacteremia due to Klebsiella pneumoniae 12/12/2011  . Bacterial overgrowth syndrome   . 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 Carotid stenosis    Carotid US (9/15):  R 40-59%; L 1-39% >> FU 1 year  . Congestive heart failure (CHF) (HCC)    EF 25-30%  . 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 Fracture of left clavicle   . History of blood transfusion 2013   anemia  . Hx of cardiovascular stress test    Myoview (9/15):  inf-apical scar; no ischemia; EF 47% - low risk   . 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 Osteoporosis   . Pancytopenia 10/07/2011  . Pathologic fracture of neck of femur ([place])   . Personal history of colonic polyps   . Renal insufficiency    hx of yrs ago  . Serratia marcescens infection - bactermia assoc w/ PICC 01/18/2015  . 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 Splenomegaly    By ultrasound  . Thrombophilia ([place])   . Vitamin D deficiency     Past Surgical History:  Procedure Laterality Date  . APPENDECTOMY  yrs ago  . CHOLECYSTECTOMY   yrs ago  . COLONOSCOPY  12/05/2005   internal hemorrhoids (for polyp surveillance)  . COLONOSCOPY  04/26/2012   Procedure: COLONOSCOPY;  Surgeon: Gatha Mayer, MD;  [facility];  Service: Endoscopy;  Laterality: N/A;  . COLONOSCOPY WITH PROPOFOL N/A 03/18/2016   Procedure: COLONOSCOPY WITH PROPOFOL;  Surgeon: Gatha Mayer, MD;  Location: [facility];  Service: Endoscopy;  Laterality: N/A;  . ESOPHAGOGASTRODUODENOSCOPY  01/22/2009   erosive esophagitis  . IR GENERIC HISTORICAL  07/04/2016   IR REMOVAL TUN CV CATH W/O FL 07/04/2016 Ascencion Dike, PA-C [facility]  . IR GENERIC HISTORICAL  07/10/2016   IR US GUIDE VASC ACCESS LEFT 07/10/2016 Arne Cleveland, MD [facility]  . IR GENERIC HISTORICAL  07/10/2016   IR FLUORO GUIDE CV LINE LEFT 07/10/2016 Arne Cleveland, MD [facility]  . IR PATIENT EVAL TECH 0-60 MINS  10/07/2017  . IR RADIOLOGIST EVAL & MGMT  07/21/2017  . ORIF PROXIMAL FEMORAL FRACTURE W/ ITST NAIL SYSTEM  03/2007   left, Dr. Shellia Carwin  . SMALL INTESTINE SURGERY  2005   multiple with right colon resection for ischemia/infarct    Family History  Problem Relation Age of Onset  . Diabetes Mother   . Hypertension Mother   . AAA (abdominal aortic aneurysm) Mother   . Colon cancer Neg Hx   . Stomach cancer Neg Hx  Social History:  reports that she quit smoking about 13 years ago. Her smoking use included cigarettes. She has never used smokeless tobacco. She reports that she does not drink alcohol or use drugs.  Allergies:  Allergies  Allergen Reactions  . Iohexol Itching and Other (See Comments)    Pt is able to use IVP dye if taken with Benadryl.    Marland Kitchen Penicillins Itching, Swelling and Other (See Comments)    Reaction:  Facial swelling Has patient had a PCN reaction causing immediate rash, facial/tongue/throat swelling, SOB or lightheadedness with hypotension: Yes Has patient had a PCN reaction causing severe rash involving mucus membranes or skin necrosis:  No Has patient had a PCN reaction that required hospitalization No Has patient had a PCN reaction occurring within the last 10 years: No If all of the above answers are "NO", then may proceed with Cephalosporin use.    Medications: I have reviewed the patient's current medications.  Results for orders placed or performed during the hospital encounter of 12/30/17 (from the past 48 hour(s))  CBC     Status: Abnormal   Collection Time: 12/30/17  3:11 PM  Result Value Ref Range   WBC 7.1 4.0 - 10.5 K/uL   RBC 3.47 (L) 3.87 - 5.11 MIL/uL   Hemoglobin 11.0 (L) 12.0 - 15.0 g/dL   HCT 33.8 (L) 36.0 - 46.0 %   MCV 97.4 78.0 - 100.0 fL   MCH 31.7 26.0 - 34.0 pg   MCHC 32.5 30.0 - 36.0 g/dL   RDW 14.9 11.5 - 15.5 %   Platelets 123 (L) 150 - 400 K/uL    Comment: Performed at [facility], [address] [place], [place] [address]  Basic metabolic panel     Status: Abnormal   Collection Time: 12/30/17  3:11 PM  Result Value Ref Range   Sodium 137 135 - 145 mmol/L   Potassium 4.4 3.5 - 5.1 mmol/L   Chloride 108 98 - 111 mmol/L   CO2 21 (L) 22 - 32 mmol/L   Glucose, Bld 94 70 - 99 mg/dL   BUN 20 8 - 23 mg/dL   Creatinine, Ser 0.96 0.44 - 1.00 mg/dL   Calcium 8.3 (L) 8.9 - 10.3 mg/dL   GFR calc non Af Amer 60 (L) >60 mL/min   GFR calc Af Amer >60 >60 mL/min    Comment: (NOTE) The eGFR has been calculated using the CKD EPI equation. This calculation has not been validated in all clinical situations. eGFR's persistently <60 mL/min signify possible Chronic Kidney Disease.    Anion gap 8 5 - 15    Comment: Performed at [facility], [address] [place], [place] [address]  Magnesium     Status: None   Collection Time: 12/30/17  3:11 PM  Result Value Ref Range   Magnesium 1.8 1.7 - 2.4 mg/dL    Comment: Performed at [facility], [address] [place], [place] [address]   *Note: Due to a large number of results and/or encounters for the requested  time period, some results have not been displayed. A complete set of results can be found in Results Review.    Ct Hip Left Wo Contrast  Result Date: 12/30/2017 CLINICAL DATA:  Fall today, left hip pain. EXAM: CT OF THE LEFT HIP WITHOUT CONTRAST TECHNIQUE: Multidetector CT imaging of the left hip was performed according to the standard protocol. Multiplanar CT image reconstructions were also generated.  COMPARISON:  12/30/2017 FINDINGS: Bones/Joint/Cartilage Three cannulated screws extend through the right femoral neck and into the femoral head. There is an acute nondisplaced intertrochanteric fracture most readily visible along the anterior cortex for example on images 29 through 34 of series 3. Difficult to exclude a component extending below the screw margin although the dominant morphology appears to be intertrochanteric rather than subtrochanteric. Lipohemarthrosis in the hip. Ligaments Suboptimally assessed by CT. Muscles and Tendons Suspected hematoma within and along the distal iloipsoas, vastus intermedius, and vastus medialis muscles proximally. Soft tissues 3.4 cm fluid density lesion of the left ovary, probably benign. IMPRESSION: 1. Nondisplaced left intertrochanteric fracture potentially with a small sub trochanteric component, nondisplaced and thus difficult to visualize. One unusual feature is the presence of a hip lipohemarthrosis, unusual given that this should normally be an extra-articular fracture and raises the question as to whether there is an occult intra-articular component such as a femoral neck component. There are 3 cannulated screws extending through the femoral neck which do not appear fractured. There is also some indistinct hematoma tracking within along the anterior proximal thigh musculature including the iloipsoas, vastus intermedius, and vastus lateralis. 2. 3.4 cm likely benign fluid density lesion of the left ovary. Based on current guidelines, follow pelvic sonography is  recommended. However, I feel that this can be deferred until the patient's acute hip problems have been addressed. This recommendation follows ACR consensus guidelines: White Paper of the ACR Incidental Findings Committee II on Adnexal Findings. J Am Coll Radiol [id number]. Electronically Signed   By: Van Clines M.D.   On: 12/30/2017 16:13   Dg Hip Unilat W Or Wo Pelvis 2-3 Views Left  Result Date: 12/30/2017 CLINICAL DATA:  Fall.  Hip pain EXAM: DG HIP (WITH OR WITHOUT PELVIS) 2-3V LEFT COMPARISON:  None. FINDINGS: 3 pins in the left femoral neck. Avulsion fracture of the greater tuberosity appears acute. Fracture may extend across proximal femur into the lesser trochanter. Left hip joint normal.  No pelvic fracture.  Right hip normal. IMPRESSION: 3 pins in the left femoral neck. Acute nondisplaced fracture of the greater trochanter which may extend across the femur. Recommend CT left hip to further evaluate fracture. Electronically Signed   By: Franchot Gallo M.D.   On: 12/30/2017 14:45    ROS Blood pressure (!) 90/49, pulse (!) 50, temperature 98.4 F (36.9 C), temperature source Oral, resp. rate 18, height _0  (1.702 m), weight 63.5 kg, last menstrual period 02/26/2014, SpO2 99 %. Physical Exam Healthy appearing female in moderate distress. No midline or paraspinous tenderness. Normal AROM cervical spine. No tenderness in the T or L spine. Chest nontender with normal excursion. Bilateral shoulders not swollen and no tenderness. Normal AROM and strength bilateral shoulders, elbows and wrists. Distally NVI. Left LE with mild to moderate pain with AROM and passive log rolling. Knee is nontender and nonswollen, no calf swelling. Normal AROM of the ankle, NVI distally Right LE with normal AROM with no pain, NVI  Assessment/Plan: Left non-displaced intertrochanteric/subtrochanteric femur fracture around previous surgical screws.  The patient has a complex medical history and although she  appears to be well compensated at this time and is living independently, she will need to be cleared medically and from cards prior to considering surgical repair. This fracture may be able to managed non-operatively. I have discussed this patient with Dr Rod Can who will see the patient tomorrow.  At this time will recommend holding the Xarelto in case of possible surgery.  Ok for a regular diet at this time. NPO after MN. Ok for TPN infusion tonight (patient receives this 4 days per week due to prior bowel resection) She is requesting to use her own TPN - pharmacy consulted. Primary care to see later.  Will follow. Bed rest for now.  Mechanical VTE prophylaxis and overhead trapeze bar ordered.  Isaia Hassell,STEVEN R 12/30/2017, 9:09 PM

## 2017-12-30 NOTE — ED Provider Notes (Signed)
5:15 PM Assumed care from Dr. Venora Maples, please see their note for full history, physical and decision making until this point. In brief this is a 67 y.o. year old female who presented to the ED tonight with Fall     Mechanical fall with questionable intertrochanteric fracture around a previous femoral neck fixation.  Pending CT scan to evaluate extent of injury.  Gust with orthopedics, agrees with orthopedics, Dr. Doren Custard, who will see patient in the hospital [place] [facility] but recommends hospitalist admission  Labs, studies and imaging reviewed by myself and considered in medical decision making if ordered. Imaging interpreted by radiology.  Labs Reviewed  CBC - Abnormal; Notable for the following components:      Result Value   RBC 3.47 (*)    Hemoglobin 11.0 (*)    HCT 33.8 (*)    Platelets 123 (*)    All other components within normal limits  BASIC METABOLIC PANEL - Abnormal; Notable for the following components:   CO2 21 (*)    Calcium 8.3 (*)    GFR calc non Af Amer 60 (*)    All other components within normal limits  MAGNESIUM    CT Hip Left Wo Contrast  Final Result    DG Hip Unilat W or Wo Pelvis 2-3 Views Left  Final Result      No follow-ups on file.    Merrily Pew, MD 12/30/17 [phone]

## 2017-12-30 NOTE — H&P (View-Only) (Signed)
Reason for Consult:Left hip fracture Referring Physician: Grandville Silos, MD  [name] [name] is an 67 y.o. female.  HPI: 67 yo female with a history of prior left hip percutaneous pinning for a femoral neck fracture who presents after a mechanical fall while shopping today complaining of left hip pain. Patient initially presented to [facility] revealed a minimally displaced/non-displaced left intertrochanteric proximal femur fracture. She was transported to [facility] for definitive evaluation and treatment. Patient complains of lateral hip and groin pain with movement and unable to bear weight on the hip. Patient denies LOC and also denies other joint pains.   Past Medical History:  Diagnosis Date  . Abnormal LFTs 2013  . Allergic rhinosinusitis   . Anemia of chronic disease   . At risk for dental problems   . Atypical nevus   . Bacteremia due to Klebsiella pneumoniae 12/12/2011  . Bacterial overgrowth syndrome   . 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 Carotid stenosis    Carotid US (9/15):  R 40-59%; L 1-39% >> FU 1 year  . Congestive heart failure (CHF) (HCC)    EF 25-30%  . 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 Fracture of left clavicle   . History of blood transfusion 2013   anemia  . Hx of cardiovascular stress test    Myoview (9/15):  inf-apical scar; no ischemia; EF 47% - low risk   . 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 Osteoporosis   . Pancytopenia 10/07/2011  . Pathologic fracture of neck of femur ([place])   . Personal history of colonic polyps   . Renal insufficiency    hx of yrs ago  . Serratia marcescens infection - bactermia assoc w/ PICC 01/18/2015  . 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 Splenomegaly    By ultrasound  . Thrombophilia ([place])   . Vitamin D deficiency     Past Surgical History:  Procedure Laterality Date  . APPENDECTOMY  yrs ago  . CHOLECYSTECTOMY   yrs ago  . COLONOSCOPY  12/05/2005   internal hemorrhoids (for polyp surveillance)  . COLONOSCOPY  04/26/2012   Procedure: COLONOSCOPY;  Surgeon: Gatha Mayer, MD;  Location: [facility];  Service: Endoscopy;  Laterality: N/A;  . COLONOSCOPY WITH PROPOFOL N/A 03/18/2016   Procedure: COLONOSCOPY WITH PROPOFOL;  Surgeon: Gatha Mayer, MD;  Location: [facility];  Service: Endoscopy;  Laterality: N/A;  . ESOPHAGOGASTRODUODENOSCOPY  01/22/2009   erosive esophagitis  . IR GENERIC HISTORICAL  07/04/2016   IR REMOVAL TUN CV CATH W/O FL 07/04/2016 Ascencion Dike, PA-C [facility]  . IR GENERIC HISTORICAL  07/10/2016   IR US GUIDE VASC ACCESS LEFT 07/10/2016 Arne Cleveland, MD [facility]  . IR GENERIC HISTORICAL  07/10/2016   IR FLUORO GUIDE CV LINE LEFT 07/10/2016 Arne Cleveland, MD [facility]  . IR PATIENT EVAL TECH 0-60 MINS  10/07/2017  . IR RADIOLOGIST EVAL & MGMT  07/21/2017  . ORIF PROXIMAL FEMORAL FRACTURE W/ ITST NAIL SYSTEM  03/2007   left, Dr. Shellia Carwin  . SMALL INTESTINE SURGERY  2005   multiple with right colon resection for ischemia/infarct    Family History  Problem Relation Age of Onset  . Diabetes Mother   . Hypertension Mother   . AAA (abdominal aortic aneurysm) Mother   . Colon cancer Neg Hx   . Stomach cancer Neg Hx  Social History:  reports that she quit smoking about 13 years ago. Her smoking use included cigarettes. She has never used smokeless tobacco. She reports that she does not drink alcohol or use drugs.  Allergies:  Allergies  Allergen Reactions  . Iohexol Itching and Other (See Comments)    Pt is able to use IVP dye if taken with Benadryl.    Marland Kitchen Penicillins Itching, Swelling and Other (See Comments)    Reaction:  Facial swelling Has patient had a PCN reaction causing immediate rash, facial/tongue/throat swelling, SOB or lightheadedness with hypotension: Yes Has patient had a PCN reaction causing severe rash involving mucus membranes or skin necrosis:  No Has patient had a PCN reaction that required hospitalization No Has patient had a PCN reaction occurring within the last 10 years: No If all of the above answers are "NO", then may proceed with Cephalosporin use.    Medications: I have reviewed the patient's current medications.  Results for orders placed or performed during the hospital encounter of 12/30/17 (from the past 48 hour(s))  CBC     Status: Abnormal   Collection Time: 12/30/17  3:11 PM  Result Value Ref Range   WBC 7.1 4.0 - 10.5 K/uL   RBC 3.47 (L) 3.87 - 5.11 MIL/uL   Hemoglobin 11.0 (L) 12.0 - 15.0 g/dL   HCT 33.8 (L) 36.0 - 46.0 %   MCV 97.4 78.0 - 100.0 fL   MCH 31.7 26.0 - 34.0 pg   MCHC 32.5 30.0 - 36.0 g/dL   RDW 14.9 11.5 - 15.5 %   Platelets 123 (L) 150 - 400 K/uL    Comment: Performed at [facility], [address] [place], [place] [address]  Basic metabolic panel     Status: Abnormal   Collection Time: 12/30/17  3:11 PM  Result Value Ref Range   Sodium 137 135 - 145 mmol/L   Potassium 4.4 3.5 - 5.1 mmol/L   Chloride 108 98 - 111 mmol/L   CO2 21 (L) 22 - 32 mmol/L   Glucose, Bld 94 70 - 99 mg/dL   BUN 20 8 - 23 mg/dL   Creatinine, Ser 0.96 0.44 - 1.00 mg/dL   Calcium 8.3 (L) 8.9 - 10.3 mg/dL   GFR calc non Af Amer 60 (L) >60 mL/min   GFR calc Af Amer >60 >60 mL/min    Comment: (NOTE) The eGFR has been calculated using the CKD EPI equation. This calculation has not been validated in all clinical situations. eGFR's persistently <60 mL/min signify possible Chronic Kidney Disease.    Anion gap 8 5 - 15    Comment: Performed at [facility], [address] [place], [place] [address]  Magnesium     Status: None   Collection Time: 12/30/17  3:11 PM  Result Value Ref Range   Magnesium 1.8 1.7 - 2.4 mg/dL    Comment: Performed at [facility], [address] [place], [place] [address]   *Note: Due to a large number of results and/or encounters for the requested  time period, some results have not been displayed. A complete set of results can be found in Results Review.    Ct Hip Left Wo Contrast  Result Date: 12/30/2017 CLINICAL DATA:  Fall today, left hip pain. EXAM: CT OF THE LEFT HIP WITHOUT CONTRAST TECHNIQUE: Multidetector CT imaging of the left hip was performed according to the standard protocol. Multiplanar CT image reconstructions were also generated.  COMPARISON:  12/30/2017 FINDINGS: Bones/Joint/Cartilage Three cannulated screws extend through the right femoral neck and into the femoral head. There is an acute nondisplaced intertrochanteric fracture most readily visible along the anterior cortex for example on images 29 through 34 of series 3. Difficult to exclude a component extending below the screw margin although the dominant morphology appears to be intertrochanteric rather than subtrochanteric. Lipohemarthrosis in the hip. Ligaments Suboptimally assessed by CT. Muscles and Tendons Suspected hematoma within and along the distal iloipsoas, vastus intermedius, and vastus medialis muscles proximally. Soft tissues 3.4 cm fluid density lesion of the left ovary, probably benign. IMPRESSION: 1. Nondisplaced left intertrochanteric fracture potentially with a small sub trochanteric component, nondisplaced and thus difficult to visualize. One unusual feature is the presence of a hip lipohemarthrosis, unusual given that this should normally be an extra-articular fracture and raises the question as to whether there is an occult intra-articular component such as a femoral neck component. There are 3 cannulated screws extending through the femoral neck which do not appear fractured. There is also some indistinct hematoma tracking within along the anterior proximal thigh musculature including the iloipsoas, vastus intermedius, and vastus lateralis. 2. 3.4 cm likely benign fluid density lesion of the left ovary. Based on current guidelines, follow pelvic sonography is  recommended. However, I feel that this can be deferred until the patient's acute hip problems have been addressed. This recommendation follows ACR consensus guidelines: White Paper of the ACR Incidental Findings Committee II on Adnexal Findings. J Am Coll Radiol [id number]. Electronically Signed   By: Van Clines M.D.   On: 12/30/2017 16:13   Dg Hip Unilat W Or Wo Pelvis 2-3 Views Left  Result Date: 12/30/2017 CLINICAL DATA:  Fall.  Hip pain EXAM: DG HIP (WITH OR WITHOUT PELVIS) 2-3V LEFT COMPARISON:  None. FINDINGS: 3 pins in the left femoral neck. Avulsion fracture of the greater tuberosity appears acute. Fracture may extend across proximal femur into the lesser trochanter. Left hip joint normal.  No pelvic fracture.  Right hip normal. IMPRESSION: 3 pins in the left femoral neck. Acute nondisplaced fracture of the greater trochanter which may extend across the femur. Recommend CT left hip to further evaluate fracture. Electronically Signed   By: Franchot Gallo M.D.   On: 12/30/2017 14:45    ROS Blood pressure (!) 90/49, pulse (!) 50, temperature 98.4 F (36.9 C), temperature source Oral, resp. rate 18, height _0  (1.702 m), weight 63.5 kg, last menstrual period 02/26/2014, SpO2 99 %. Physical Exam Healthy appearing female in moderate distress. No midline or paraspinous tenderness. Normal AROM cervical spine. No tenderness in the T or L spine. Chest nontender with normal excursion. Bilateral shoulders not swollen and no tenderness. Normal AROM and strength bilateral shoulders, elbows and wrists. Distally NVI. Left LE with mild to moderate pain with AROM and passive log rolling. Knee is nontender and nonswollen, no calf swelling. Normal AROM of the ankle, NVI distally Right LE with normal AROM with no pain, NVI  Assessment/Plan: Left non-displaced intertrochanteric/subtrochanteric femur fracture around previous surgical screws.  The patient has a complex medical history and although she  appears to be well compensated at this time and is living independently, she will need to be cleared medically and from cards prior to considering surgical repair. This fracture may be able to managed non-operatively. I have discussed this patient with Dr Rod Can who will see the patient tomorrow.  At this time will recommend holding the Xarelto in case of possible surgery.  Ok for a regular diet at this time. NPO after MN. Ok for TPN infusion tonight (patient receives this 4 days per week due to prior bowel resection) She is requesting to use her own TPN - pharmacy consulted. Primary care to see later.  Will follow. Bed rest for now.  Mechanical VTE prophylaxis and overhead trapeze bar ordered.  Kenli Waldo,STEVEN R 12/30/2017, 9:09 PM

## 2017-12-30 NOTE — H&P (Addendum)
[name] [name] CBJ:[id number] DOB: 10-15-1950 DOA: 12/30/2017     PCP: [name], MD   Outpatient Specialists:  CARDS:  Dr. Jenkins Rouge    Patient arrived to ER on 12/30/17 at 1332  Patient coming from:    home Lives  With family   Chief Complaint:  Chief Complaint  Patient presents with  . Fall    HPI: [name] [name] is a 67 y.o. 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 Shellia Carwin    Presented with mechanical fall regarding left hip pain. She went to [employer] and tripped over her own her own feet. No Syncope, no chest pain, no shortness of breath.  Patient apparently tripped around 1 PM today injuring her left hip pain radiating down to left knee denies any head injury no chest pain or shortness of breath no abdominal pain no back pain history significant for prior hip surgery EMS was called. arelto 8:30 PM last night, At baseline active, able towalk a flight of stairs or a block. 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 Doren Custard ([facility]) with orthopedics patient was accepted to [facility]  The following Work up has been ordered so far:  Orders Placed This Encounter  Procedures  . DG Hip Unilat W or Wo Pelvis 2-3 Views Left  . CT Hip Left Wo Contrast  . CBC  . Basic metabolic panel  . Magnesium  . Diet NPO time specified Except for: Sips with Meds  . Diet Heart Room service appropriate? Yes; Fluid consistency: Thin  . Cardiac monitoring  . Consult to orthopedic surgery  On call for Dr. Tarri Glenn at [facility]  . Consult to hospitalist  . EKG 12-Lead  . EKG 12-Lead  . Saline lock IV  . Admit to Inpatient (patient's expected length of stay will be greater than 2 midnights or inpatient only procedure)      Following Medications were ordered in ER: Medications  morphine 4 MG/ML injection 4 mg (4 mg Intravenous Given 12/30/17 1510)  ondansetron (ZOFRAN) injection 4 mg (4 mg Intravenous Given 12/30/17 1548)  lactated ringers bolus 500 mL (500 mLs Intravenous Transfusing/Transfer 12/30/17 1844)  fentaNYL (SUBLIMAZE) injection 50 mcg (50 mcg Intravenous Given 12/30/17 1845)    Significant initial  Findings: Abnormal Labs Reviewed  CBC - Abnormal; Notable for the following components:      Result Value   RBC 3.47 (*)    Hemoglobin 11.0 (*)    HCT 33.8 (*)    Platelets 123 (*)    All other components within normal limits  BASIC METABOLIC PANEL - Abnormal; Notable for the following components:   CO2 21 (*)  Calcium 8.3 (*)    GFR calc non Af Amer 60 (*)    All other components within normal limits   Trop 0.10 ->0.09  Na 137 K 4.4 Mg 1.8 Cr   stable,   Lab Results  Component Value Date   CREATININE 0.96 12/30/2017   CREATININE 1.07 12/14/2017   CREATININE 0.95 11/09/2017      WBC 7.1  HG/HCT  stable,       Component Value Date/Time   HGB 11.0 (L) 12/30/2017 1511   HGB 9.9 (L) 05/02/2014 1200   HCT 33.8 (L) 12/30/2017 1511   HCT 30.8 (L) 05/02/2014 1200      UA not ordered  Left hip acute nondisplaced fracture of the  greater trochanter which may extend across the femur Ct: Nondisplaced left intertrochanteric fracture potentially with a small sub trochanteric component, nondisplaced and thus difficult to visualize.   3.4 cm likely benign fluid density lesion of the left ovary.  CXR - non acute PIcc Line tip at the junction of the innominate veins    ECG:  Personally reviewed by me showing: HR : 50 Rhythm: junctional rythm   no evidence of ischemic changes QTC 462       ED Triage Vitals  Enc Vitals Group     BP 12/30/17 1340 118/70     Pulse Rate 12/30/17 1340 68     Resp 12/30/17 1340 20     Temp 12/30/17 1340 98.1 F (36.7 C)     Temp Source 12/30/17 1340 Oral     SpO2 12/30/17 1340 100 %     Weight 12/30/17 1340 140 lb (63.5 kg)     Height 12/30/17 1340 '5\' 7"'$  (1.702 m)     Head Circumference --      Peak Flow --      Pain Score 12/30/17 1339 8     Pain Loc --      Pain Edu? --      Excl. in [place]? --   TMAX(24)@       Latest  Blood pressure 106/62, pulse (!) 53, temperature 98.1 F (36.7 C), temperature source Oral, resp. rate 20, height '5\' 7"'$  (1.702 m), weight 63.5 kg, last menstrual period 02/26/2014, SpO2 100 %.      I Called:     Dr.Norris They Recommend NPO Post midnight and hold xarelto Will see in AM   Hospitalist was called for admission for Left hip fracture   Review of Systems:    Pertinent positives include: left leg pain, left arm swelling.   Constitutional:  No weight loss, night sweats, Fevers, chills, fatigue, weight loss  HEENT:  No headaches, Difficulty swallowing,Tooth/dental problems,Sore throat,  No sneezing, itching, ear ache, nasal congestion, post nasal drip,  Cardio-vascular:  No chest pain, Orthopnea, PND, anasarca, dizziness, palpitations.no Bilateral lower extremity swelling  GI:  No heartburn, indigestion, abdominal pain, nausea, vomiting, diarrhea, change in bowel habits, loss of appetite, melena, blood in stool, hematemesis Resp:  no  shortness of breath at rest. No dyspnea on exertion, No excess mucus, no productive cough, No non-productive cough, No coughing up of blood.No change in color of mucus.No wheezing. Skin:  no rash or lesions. No jaundice GU:  no dysuria, change in color of urine, no urgency or frequency. No straining to urinate.  No flank pain.  Musculoskeletal:  No joint pain or no joint swelling. No decreased range of motion. No back pain.  Psych:  No change in mood or affect. No  depression or anxiety. No memory loss.  Neuro: no localizing neurological complaints, no tingling, no weakness, no double vision, no gait abnormality, no slurred speech, no confusion  All systems reviewed and apart from [place] all are negative  Past Medical History:   Past Medical History:  Diagnosis Date  . Abnormal LFTs 2013  . Allergic rhinosinusitis   . Anemia of chronic disease   . At risk for dental problems   . Atypical nevus   . Bacteremia due to Klebsiella pneumoniae 12/12/2011  . Bacterial overgrowth syndrome   . Brachial vein thrombus, left ([place]) 10/08/2012  . Carotid stenosis    Carotid US (9/15):  R 40-59%; L 1-39% >> FU 1 year  . Congestive heart failure (CHF) (HCC)    EF 25-30%  . 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 Fracture of left clavicle   . History of blood transfusion 2013   anemia  . Hx of cardiovascular stress test    Myoview (9/15):  inf-apical scar; no ischemia; EF 47% - low risk   . 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 Osteoporosis   . Pancytopenia 10/07/2011  . Pathologic fracture of neck of femur ([place])   . Personal history of colonic polyps   . Renal insufficiency    hx of yrs ago  . Serratia marcescens infection - bactermia assoc w/ PICC 01/18/2015  . 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 Splenomegaly    By ultrasound  . Thrombophilia ([place])   . Vitamin D deficiency       Past Surgical History:  Procedure  Laterality Date  . APPENDECTOMY  yrs ago  . CHOLECYSTECTOMY  yrs ago  . COLONOSCOPY  12/05/2005   internal hemorrhoids (for polyp surveillance)  . COLONOSCOPY  04/26/2012   Procedure: COLONOSCOPY;  Surgeon: Gatha Mayer, MD;  Location: [facility];  Service: Endoscopy;  Laterality: N/A;  . COLONOSCOPY WITH PROPOFOL N/A 03/18/2016   Procedure: COLONOSCOPY WITH PROPOFOL;  Surgeon: Gatha Mayer, MD;  Location: [facility];  Service: Endoscopy;  Laterality: N/A;  . ESOPHAGOGASTRODUODENOSCOPY  01/22/2009   erosive esophagitis  . IR GENERIC HISTORICAL  07/04/2016   IR REMOVAL TUN CV CATH W/O FL 07/04/2016 Ascencion Dike, PA-C [facility]  . IR GENERIC HISTORICAL  07/10/2016   IR US GUIDE VASC ACCESS LEFT 07/10/2016 Arne Cleveland, MD [facility]  . IR GENERIC HISTORICAL  07/10/2016   IR FLUORO GUIDE CV LINE LEFT 07/10/2016 Arne Cleveland, MD [facility]  . IR PATIENT EVAL TECH 0-60 MINS  10/07/2017  . IR RADIOLOGIST EVAL & MGMT  07/21/2017  . ORIF PROXIMAL FEMORAL FRACTURE W/ ITST NAIL SYSTEM  03/2007   left, Dr. Shellia Carwin  . SMALL INTESTINE SURGERY  2005   multiple with right colon resection for ischemia/infarct    Social History:  Ambulatory  independently       reports that she quit smoking about 13 years ago. Her smoking use included cigarettes. She has never used smokeless tobacco. She reports that she does not drink alcohol or use drugs.     Family History:   Family History  Problem Relation Age of Onset  . Diabetes Mother   . Hypertension Mother   . AAA (abdominal aortic aneurysm) Mother   . Colon cancer Neg Hx   . Stomach cancer Neg Hx     Allergies: Allergies  Allergen Reactions  . Iohexol Itching and Other (  See Comments)    Pt is able to use IVP dye if taken with Benadryl.    Marland Kitchen Penicillins Itching, Swelling and Other (See Comments)    Reaction:  Facial swelling Has patient had a PCN reaction causing immediate rash, facial/tongue/throat swelling, SOB or  lightheadedness with hypotension: Yes Has patient had a PCN reaction causing severe rash involving mucus membranes or skin necrosis: No Has patient had a PCN reaction that required hospitalization No Has patient had a PCN reaction occurring within the last 10 years: No If all of the above answers are "NO", then may proceed with Cephalosporin use.     Prior to Admission medications   Medication Sig Start Date End Date Taking? Authorizing Provider  ADULT TPN 1,800 mLs 4 (four) times a week. Pt receives home TPN from [facility]:  1800 mL bag, four nights weekly (Monday, Wednesday, Thursday, and Friday) for 8 hours (includes 1 hour taper up and down).    [provider]  albuterol (PROVENTIL HFA;VENTOLIN HFA) 108 (90 Base) MCG/ACT inhaler Inhale 2 puffs into the lungs every 6 (six) hours as needed for wheezing or shortness of breath. 04/17/17   [name], MD  aspirin EC 81 MG tablet Take 1 tablet (81 mg total) by mouth daily. 11/17/13   Richardson Dopp T, PA-C  diphenhydrAMINE (BENADRYL) 25 mg capsule Take 25-50 mg by mouth every 6 (six) hours as needed for itching or sleep.     [provider]  diphenoxylate-atropine (LOMOTIL) 2.5-0.025 MG tablet Take 1 tablet by mouth 4 (four) times daily as needed for diarrhea or loose stools. 03/24/16   Gatha Mayer, MD  doxycycline (VIBRA-TABS) 100 MG tablet Take 1 tablet (100 mg total) by mouth 2 (two) times daily. 11/19/17   [name], MD  Multiple Vitamin (MULTIVITAMIN WITH MINERALS) TABS Take 1 tablet by mouth daily.    [provider]  omeprazole (PRILOSEC) 40 MG capsule TAKE 1 CAPSULE 2 (TWO) TIMES DAILY BY MOUTH. TAKE BEFORE BREAKFAST AND SUPPER 09/29/17   Gatha Mayer, MD  ondansetron (ZOFRAN-ODT) 8 MG disintegrating tablet DISSOLVE 1 TABLET BY MOUTH EVERY 8 HOURS AS NEEDED FOR NAUSEA OR VOMITING 30 MIN BEFORE INFUSION 06/03/17   Gatha Mayer, MD  rivaroxaban (XARELTO) 20 MG TABS tablet Take 1 tablet (20 mg  total) by mouth daily with supper. 08/14/17   [name], MD  sodium chloride 0.9 % infusion Uses for TPN 10/27/17   [provider]  Teduglutide, rDNA, (GATTEX) 5 MG KIT Inject 3.3 Units into the skin daily. 09/11/14   Gatha Mayer, MD  vitamin E 400 UNIT capsule Take 400 Units by mouth daily.     [provider]   Physical Exam: Blood pressure 106/62, pulse (!) 53, temperature 98.1 F (36.7 C), temperature source Oral, resp. rate 20, height _0  (1.702 m), weight 63.5 kg, last menstrual period 02/26/2014, SpO2 100 %. 1. General:  in No Acute distress  well  -appearing 2. Psychological: Alert and   Oriented 3. Head/ENT:     Dry Mucous Membranes                          Head Non traumatic, neck supple                            Poor Dentition 4. SKIN:  decreased Skin turgor,  Skin clean Dry and  intact no rash 5. Heart: Regular rate and rhythm no Murmur, no Rub or gallop 6. Lungs: Clear to auscultation bilaterally, no wheezes or crackles   7. Abdomen: Soft,  non-tender, Non distended  bowel sounds present 8. Lower extremities: no clubbing, cyanosis, or  edema 9. Neurologically Grossly intact, moving all 4 extremities equally   10. MSK: Normal range of motion limited due to pain    LABS:     Recent Labs  Lab 12/30/17 1511  WBC 7.1  HGB 11.0*  HCT 33.8*  MCV 97.4  PLT 664*   Basic Metabolic Panel: Recent Labs  Lab 12/30/17 1511  NA 137  K 4.4  CL 108  CO2 21*  GLUCOSE 94  BUN 20  CREATININE 0.96  CALCIUM 8.3*  MG 1.8      No results for input(s): AST, ALT, ALKPHOS, BILITOT, PROT, ALBUMIN in the last 168 hours. No results for input(s): LIPASE, AMYLASE in the last 168 hours. No results for input(s): AMMONIA in the last 168 hours.    HbA1C: No results for input(s): HGBA1C in the last 72 hours. CBG: No results for input(s): GLUCAP in the last 168 hours.    Urine analysis:    Component Value Date/Time   COLORURINE YELLOW 09/17/2012  1122   APPEARANCEUR CLEAR 09/17/2012 1122   LABSPEC 1.019 09/17/2012 1122   PHURINE 5.5 09/17/2012 1122   GLUCOSEU NEGATIVE 09/17/2012 1122   HGBUR MODERATE (A) 09/17/2012 1122   HGBUR trace-intact 04/12/2010 0958   BILIRUBINUR NEGATIVE 09/17/2012 1122   BILIRUBINUR neg 04/08/2011 0947   KETONESUR NEGATIVE 09/17/2012 1122   PROTEINUR NEGATIVE 09/17/2012 1122   UROBILINOGEN 0.2 09/17/2012 1122   NITRITE NEGATIVE 09/17/2012 1122   LEUKOCYTESUR SMALL (A) 09/17/2012 1122       Cultures:    Component Value Date/Time   SDES BLOOD RIGHT HAND 07/08/2016 0825   SPECREQUEST IN PEDIATRIC BOTTLE 3CC 07/08/2016 0825   CULT  07/08/2016 0825    NO GROWTH 5 DAYS Performed at [facility], [place] [address] [place], [place] [address]    REPTSTATUS 07/13/2016 FINAL 07/08/2016 0825     Radiological Exams on Admission: Ct Hip Left Wo Contrast  Result Date: 12/30/2017 CLINICAL DATA:  Fall today, left hip pain. EXAM: CT OF THE LEFT HIP WITHOUT CONTRAST TECHNIQUE: Multidetector CT imaging of the left hip was performed according to the standard protocol. Multiplanar CT image reconstructions were also generated. COMPARISON:  12/30/2017 FINDINGS: Bones/Joint/Cartilage Three cannulated screws extend through the right femoral neck and into the femoral head. There is an acute nondisplaced intertrochanteric fracture most readily visible along the anterior cortex for example on images 29 through 34 of series 3. Difficult to exclude a component extending below the screw margin although the dominant morphology appears to be intertrochanteric rather than subtrochanteric. Lipohemarthrosis in the hip. Ligaments Suboptimally assessed by CT. Muscles and Tendons Suspected hematoma within and along the distal iloipsoas, vastus intermedius, and vastus medialis muscles proximally. Soft tissues 3.4 cm fluid density lesion of the left ovary, probably benign. IMPRESSION: 1. Nondisplaced left intertrochanteric fracture  potentially with a small sub trochanteric component, nondisplaced and thus difficult to visualize. One unusual feature is the presence of a hip lipohemarthrosis, unusual given that this should normally be an extra-articular fracture and raises the question as to whether there is an occult intra-articular component such as a femoral neck component. There are 3 cannulated screws extending through the femoral neck which do not appear fractured. There is also some  indistinct hematoma tracking within along the anterior proximal thigh musculature including the iloipsoas, vastus intermedius, and vastus lateralis. 2. 3.4 cm likely benign fluid density lesion of the left ovary. Based on current guidelines, follow pelvic sonography is recommended. However, I feel that this can be deferred until the patient's acute hip problems have been addressed. This recommendation follows ACR consensus guidelines: White Paper of the ACR Incidental Findings Committee II on Adnexal Findings. J Am Coll Radiol [phone]. Electronically Signed   By: Van Clines M.D.   On: 12/30/2017 16:13   Dg Hip Unilat W Or Wo Pelvis 2-3 Views Left  Result Date: 12/30/2017 CLINICAL DATA:  Fall.  Hip pain EXAM: DG HIP (WITH OR WITHOUT PELVIS) 2-3V LEFT COMPARISON:  None. FINDINGS: 3 pins in the left femoral neck. Avulsion fracture of the greater tuberosity appears acute. Fracture may extend across proximal femur into the lesser trochanter. Left hip joint normal.  No pelvic fracture.  Right hip normal. IMPRESSION: 3 pins in the left femoral neck. Acute nondisplaced fracture of the greater trochanter which may extend across the femur. Recommend CT left hip to further evaluate fracture. Electronically Signed   By: Franchot Gallo M.D.   On: 12/30/2017 14:45    Chart has been reviewed    Assessment/Plan  67 y.o. 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 Hip fracture ([place]) -  - management as per orthopedics,  plan to reassess in  a.m.   Keep nothing by mouth post midnight. Patient    on anticoagulation  on hold Ordered type and screen, Place Foley, order a vitamin D level  Patient at baseline  able to walk a flight of stairs or 100 feet       Patient denies any chest pain or shortness of breath currently and/or with exertion,    ECG showing no evidence of acute ischemia, but troponin elevated   known history of coronary artery disease, CHF  Given advanced age patient is at least moderate  risk  which has been discussed with family but at this point no furthther cardiac workup is indicated.     cycle CE     Cardiology consult for further pre-op clearance given extensive cardiac disease    . Acquired short bowel syndrome - hold off on TPN for tonight until have functioning PICC, IR consult for PICC replacement.  . Anemia of chronic disease stable continue to monitor order type and screen . Chronic systolic heart failure (HCC) currently appears to be dry will gently rehydrate . 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 -Xarelto on hold for tonight awaiting on decision if patient will need OR . GERD (gastroesophageal reflux disease) stable continue home medications . 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[place]) -on Xarelto currently on hold for tonight patient will also need IR consult for PICC line exchange   . Hypotension patient states this is chronic hold blood pressure medications give gentle fluids and follow . Bradycardia not on betablocker hold off for now. Check TSh Elevated troponin -  currently no chest pain or shortness of breath order echogram  Other plan as per orders.  DVT prophylaxis:   SCD    Code Status:  FULL CODE as per patient   I  had personally discussed CODE STATUS with patient    Family Communication:   Family not  at  Bedside    Disposition Plan:     likely will need placement for rehabilitation                   Would benefit from PT/OT eval prior to DC  Defer to orthopedics                                    Social Work  consulted                   Nutrition    consulted                                  Consults called: Orthopedics Spoke to Dr. Veverly Fells, emailed cardiology   Admission status:    inpatient      Level of care    tele            Toy Baker 12/31/2017, 12:38 AM    [facility]  Pager [phone]   after 2 AM please page floor coverage PA If 7AM-7PM, please contact the day team taking care of the patient  [email or web address]  Password [email or web address]

## 2017-12-30 NOTE — Progress Notes (Addendum)
PHARMACY - ADULT TOTAL PARENTERAL NUTRITION CONSULT NOTE   Pharmacy Consult for TPN Indication: chronic TPN for short bowel syndrome secondary to bowel resection   Patient Measurements: Height: 5\' 7"  (170.2 cm) Weight: 140 lb (63.5 kg) IBW/kg (Calculated) : 61.6 TPN AdjBW (KG): 63.5 Body mass index is 21.93 kg/m.  Insulin Requirements: none  Current Nutrition:  chronic TPN. Regular diet ordered until midnight, then NPO  IVF: NS at 65ml/hr ordered per MD  Central access: PICC already in place PTA TPN start date: chronic TPN at home  ASSESSMENT                                                                                                          HPI: 67 y/oF on chronic TPN at home for several years for acquired short bowel syndrome secondary to bowel resection who presented to [facility] after sustaining a mechanical fall and left hip pain. CT concerning for periprosthetic left hip fracture, and patient transferred to [facility] for management by orthopedics. Pharmacy asked to assist with TPN management while patient hospitalized.   Significant events:   Today:    Glucose - 94 on BMET   Electrolytes - Cl- slightly low, Mag on low end of normal range, calcium slightly low. Others WNL   Renal - SCr WNL  LFTs - ordered for AM  TGs - ordered for AM   Prealbumin - 16 (on 8/5), ordered for AM   NUTRITIONAL GOALS                                                                                             Nutritional Goals: RD consulted 12/30/17  Home TPN regimen provides: 1853 Kcal/day and 62g/day protein -patient infuses TPN four nights a week (Monday, Tuesday, Wednesday, Thursday)   PLAN                                                                                                                          Magnesium 1g IV x 1 now   Use patient's home TPN tonight via CADD pump: infuse 1839mL over 8 hours with one hour taper up and one hour taper down per home  regimen.   TPN contains  fat emulsion within same bag (3-in-1 formula).   TPN contains standard multivitamins and trace elements.   Follow glucose on CMET in AM to determine need for SSI coverage.    TPN lab panels on Mondays & Thursdays.  F/u daily.  Lindell Spar, PharmD, BCPS Pager: [phone] 12/30/2017 10:20 PM   Addendum:  Damaris Schooner to Dr. Jama Flavors line is not functional at this time, so will NOT infuse TPN tonight. Plan is for exchange of PICC line tomorrow. Continue NS at 85ml/hr tonight as ordered, per MD.    Lindell Spar, PharmD, BCPS Pager: [phone] 12/30/2017 10:54 PM

## 2017-12-30 NOTE — Progress Notes (Signed)
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  Patient seen in the ED CT imaging done concerning for periprosthetic left hip fracture.  Per EDP, [facility] orthopedics consulted and EDP spoke with Dr. Doren Custard who has agreed to see the patient in consultation for possible surgery. Patient accepted to telemetry bed at [facility] will need to be formally consulted, once patient arrives to [facility] hospital.  No charge.

## 2017-12-31 ENCOUNTER — Inpatient Hospital Stay (HOSPITAL_COMMUNITY): Payer: Medicare Other

## 2017-12-31 ENCOUNTER — Encounter (HOSPITAL_COMMUNITY)
Admission: EM | Disposition: A | Discharge status: Home / Self Care | Payer: Self-pay | Source: Home / Self Care | Attending: Family Medicine

## 2017-12-31 ENCOUNTER — Inpatient Hospital Stay (HOSPITAL_COMMUNITY): Payer: Medicare Other | Admitting: Certified Registered Nurse Anesthetist

## 2017-12-31 ENCOUNTER — Encounter (HOSPITAL_COMMUNITY): Payer: Self-pay

## 2017-12-31 ENCOUNTER — Other Ambulatory Visit: Payer: Self-pay

## 2017-12-31 DIAGNOSIS — S7225XA Nondisplaced subtrochanteric fracture of left femur, initial encounter for closed fracture: Secondary | ICD-10-CM

## 2017-12-31 DIAGNOSIS — Z472 Encounter for removal of internal fixation device: Secondary | ICD-10-CM | POA: Diagnosis not present

## 2017-12-31 DIAGNOSIS — S7222XA Displaced subtrochanteric fracture of left femur, initial encounter for closed fracture: Secondary | ICD-10-CM | POA: Diagnosis not present

## 2017-12-31 DIAGNOSIS — I5022 Chronic systolic (congestive) heart failure: Secondary | ICD-10-CM | POA: Diagnosis not present

## 2017-12-31 DIAGNOSIS — Z01810 Encounter for preprocedural cardiovascular examination: Secondary | ICD-10-CM

## 2017-12-31 DIAGNOSIS — I110 Hypertensive heart disease with heart failure: Secondary | ICD-10-CM | POA: Diagnosis not present

## 2017-12-31 HISTORY — PX: HARDWARE REMOVAL: SHX979

## 2017-12-31 HISTORY — DX: Displaced subtrochanteric fracture of left femur, initial encounter for closed fracture: S72.22XA

## 2017-12-31 HISTORY — PX: INTRAMEDULLARY (IM) NAIL INTERTROCHANTERIC: SHX5875

## 2017-12-31 LAB — COMPREHENSIVE METABOLIC PANEL
ALT: 19 U/L (ref 0–44)
AST: 20 U/L (ref 15–41)
Albumin: 3.1 g/dL — ABNORMAL LOW (ref 3.5–5.0)
Alkaline Phosphatase: 64 U/L (ref 38–126)
Anion gap: 6 (ref 5–15)
BILIRUBIN TOTAL: 0.7 mg/dL (ref 0.3–1.2)
BUN: 16 mg/dL (ref 8–23)
CO2: 23 mmol/L (ref 22–32)
CREATININE: 0.95 mg/dL (ref 0.44–1.00)
Calcium: 8.8 mg/dL — ABNORMAL LOW (ref 8.9–10.3)
Chloride: 110 mmol/L (ref 98–111)
GFR calc Af Amer: >60 mL/min (ref >60)
Glucose, Bld: 87 mg/dL (ref 70–99)
POTASSIUM: 4.2 mmol/L (ref 3.5–5.1)
Sodium: 139 mmol/L (ref 135–145)
TOTAL PROTEIN: 6.9 g/dL (ref 6.5–8.1)

## 2017-12-31 LAB — CBC WITH DIFFERENTIAL/PLATELET
BASOS ABS: 0 10*3/uL (ref 0.0–0.1)
BASOS PCT: 1 %
EOS ABS: 0.3 10*3/uL (ref 0.0–0.7)
Eosinophils Relative: 6 %
HEMATOCRIT: 31.9 % — AB (ref 36.0–46.0)
HEMOGLOBIN: 10.3 g/dL — AB (ref 12.0–15.0)
Lymphocytes Relative: 35 %
Lymphs Abs: 2 10*3/uL (ref 0.7–4.0)
MCH: 31.4 pg (ref 26.0–34.0)
MCHC: 32.3 g/dL (ref 30.0–36.0)
MCV: 97.3 fL (ref 78.0–100.0)
MONOS PCT: 18 %
Monocytes Absolute: 1 10*3/uL (ref 0.1–1.0)
NEUTROS ABS: 2.4 10*3/uL (ref 1.7–7.7)
Neutrophils Relative %: 40 %
Platelets: 129 10*3/uL — ABNORMAL LOW (ref 150–400)
RBC: 3.28 MIL/uL — AB (ref 3.87–5.11)
RDW: 14.7 % (ref 11.5–15.5)
WBC: 5.7 10*3/uL (ref 4.0–10.5)

## 2017-12-31 LAB — SURGICAL PCR SCREEN
MRSA, PCR: NEGATIVE
STAPHYLOCOCCUS AUREUS: NEGATIVE

## 2017-12-31 LAB — MAGNESIUM: Magnesium: 2 mg/dL (ref 1.7–2.4)

## 2017-12-31 LAB — PHOSPHORUS: Phosphorus: 3.8 mg/dL (ref 2.5–4.6)

## 2017-12-31 LAB — TROPONIN I
TROPONIN I: 0.09 ng/mL — AB (ref <0.03)
Troponin I: 0.1 ng/mL (ref <0.03)

## 2017-12-31 LAB — TYPE AND SCREEN
ABO/RH(D): B POS
ANTIBODY SCREEN: NEGATIVE

## 2017-12-31 LAB — TSH: TSH: 2.942 u[IU]/mL (ref 0.350–4.500)

## 2017-12-31 LAB — PREALBUMIN: PREALBUMIN: 14.8 mg/dL — AB (ref 18–38)

## 2017-12-31 LAB — TRIGLYCERIDES: TRIGLYCERIDES: 149 mg/dL (ref <150)

## 2017-12-31 SURGERY — FIXATION, FRACTURE, INTERTROCHANTERIC, WITH INTRAMEDULLARY ROD
Anesthesia: General | Site: Leg Upper | Laterality: Left

## 2017-12-31 MED ORDER — LACTATED RINGERS IV SOLN
INTRAVENOUS | Status: DC
  Administered 2017-12-31 – 2018-01-01 (×2): via INTRAVENOUS

## 2017-12-31 MED ORDER — CLINDAMYCIN PHOSPHATE 900 MG/50ML IV SOLN: 900.0000 mg | INTRAVENOUS | Status: DC

## 2017-12-31 MED ORDER — EPHEDRINE SULFATE 50 MG/ML IJ SOLN
INTRAMUSCULAR | Status: DC | PRN
  Administered 2017-12-31: 5 mg via INTRAVENOUS

## 2017-12-31 MED ORDER — FENTANYL CITRATE (PF) 100 MCG/2ML IJ SOLN
INTRAMUSCULAR | Status: AC
  Filled 2017-12-31: qty 2

## 2017-12-31 MED ORDER — SODIUM CHLORIDE 0.9 % IV SOLN
INTRAVENOUS | Status: DC | PRN
  Administered 2017-12-31: 50 ug/min via INTRAVENOUS

## 2017-12-31 MED ORDER — ONDANSETRON HCL 4 MG/2ML IJ SOLN: 4.0000 mg | Freq: Four times a day (QID) | INTRAMUSCULAR | Status: DC | PRN

## 2017-12-31 MED ORDER — FENTANYL CITRATE (PF) 100 MCG/2ML IJ SOLN
INTRAMUSCULAR | Status: DC | PRN
  Administered 2017-12-31 (×4): 50 ug via INTRAVENOUS

## 2017-12-31 MED ORDER — ACETAMINOPHEN 160 MG/5ML PO SOLN: 325.0000 mg | ORAL | Status: DC | PRN

## 2017-12-31 MED ORDER — OXYCODONE HCL 5 MG/5ML PO SOLN
5.0000 mg | Freq: Once | ORAL | Status: DC | PRN
  Filled 2017-12-31: qty 5

## 2017-12-31 MED ORDER — ROCURONIUM BROMIDE 50 MG/5ML IV SOSY
PREFILLED_SYRINGE | INTRAVENOUS | Status: DC | PRN
  Administered 2017-12-31: 60 mg via INTRAVENOUS

## 2017-12-31 MED ORDER — ONDANSETRON HCL 4 MG PO TABS: 4.0000 mg | ORAL_TABLET | Freq: Four times a day (QID) | ORAL | Status: DC | PRN

## 2017-12-31 MED ORDER — TRACE MINERALS CR-CU-MN-SE-ZN 10-1000-500-60 MCG/ML IV SOLN
INTRAVENOUS | Status: DC
  Filled 2017-12-31: qty 1800

## 2017-12-31 MED ORDER — OXYCODONE HCL 5 MG PO TABS: 5.0000 mg | ORAL_TABLET | Freq: Once | ORAL | Status: DC | PRN

## 2017-12-31 MED ORDER — STERILE WATER FOR IRRIGATION IR SOLN
Status: DC | PRN
  Administered 2017-12-31: 2000 mL

## 2017-12-31 MED ORDER — ISOPROPYL ALCOHOL 70 % SOLN
Status: AC
  Filled 2017-12-31: qty 480

## 2017-12-31 MED ORDER — CLINDAMYCIN PHOSPHATE 900 MG/50ML IV SOLN
900.0000 mg | INTRAVENOUS | Status: AC
  Administered 2017-12-31: 900 mg via INTRAVENOUS
  Filled 2017-12-31: qty 50

## 2017-12-31 MED ORDER — CHLORHEXIDINE GLUCONATE 4 % EX LIQD
60.0000 mL | Freq: Once | CUTANEOUS | Status: AC
  Administered 2017-12-31: 4 via TOPICAL
  Filled 2017-12-31: qty 60

## 2017-12-31 MED ORDER — ONDANSETRON HCL 4 MG/2ML IJ SOLN
INTRAMUSCULAR | Status: AC
  Filled 2017-12-31: qty 2

## 2017-12-31 MED ORDER — PHENOL 1.4 % MT LIQD
1.0000 | OROMUCOSAL | Status: DC | PRN
  Filled 2017-12-31: qty 177

## 2017-12-31 MED ORDER — ALBUMIN HUMAN 5 % IV SOLN
INTRAVENOUS | Status: AC
  Filled 2017-12-31: qty 250

## 2017-12-31 MED ORDER — ALBUMIN HUMAN 5 % IV SOLN
INTRAVENOUS | Status: DC | PRN
  Administered 2017-12-31: 18:00:00 via INTRAVENOUS

## 2017-12-31 MED ORDER — CLINDAMYCIN PHOSPHATE 600 MG/50ML IV SOLN
600.0000 mg | Freq: Four times a day (QID) | INTRAVENOUS | Status: AC
  Administered 2017-12-31 – 2018-01-01 (×2): 600 mg via INTRAVENOUS
  Filled 2017-12-31 (×2): qty 50

## 2017-12-31 MED ORDER — ONDANSETRON HCL 4 MG/2ML IJ SOLN
INTRAMUSCULAR | Status: DC | PRN
  Administered 2017-12-31: 4 mg via INTRAVENOUS

## 2017-12-31 MED ORDER — DOCUSATE SODIUM 100 MG PO CAPS
100.0000 mg | ORAL_CAPSULE | Freq: Two times a day (BID) | ORAL | Status: DC
  Administered 2017-12-31: 100 mg via ORAL
  Filled 2017-12-31 (×2): qty 1

## 2017-12-31 MED ORDER — POVIDONE-IODINE 10 % EX SWAB
2.0000 "application " | Freq: Once | CUTANEOUS | Status: AC
  Administered 2017-12-31: 2 via TOPICAL

## 2017-12-31 MED ORDER — FENTANYL CITRATE (PF) 100 MCG/2ML IJ SOLN
25.0000 ug | INTRAMUSCULAR | Status: DC | PRN
  Administered 2017-12-31: 50 ug via INTRAVENOUS

## 2017-12-31 MED ORDER — DEXAMETHASONE SODIUM PHOSPHATE 10 MG/ML IJ SOLN
INTRAMUSCULAR | Status: DC | PRN
  Administered 2017-12-31: 10 mg via INTRAVENOUS

## 2017-12-31 MED ORDER — MIDAZOLAM HCL 5 MG/5ML IJ SOLN
INTRAMUSCULAR | Status: DC | PRN
  Administered 2017-12-31 (×2): 1 mg via INTRAVENOUS

## 2017-12-31 MED ORDER — DEXAMETHASONE SODIUM PHOSPHATE 10 MG/ML IJ SOLN
INTRAMUSCULAR | Status: AC
  Filled 2017-12-31: qty 1

## 2017-12-31 MED ORDER — METOCLOPRAMIDE HCL 5 MG PO TABS: 5.0000 mg | ORAL_TABLET | Freq: Three times a day (TID) | ORAL | Status: DC | PRN

## 2017-12-31 MED ORDER — PHENYLEPHRINE 40 MCG/ML (10ML) SYRINGE FOR IV PUSH (FOR BLOOD PRESSURE SUPPORT)
PREFILLED_SYRINGE | INTRAVENOUS | Status: DC | PRN
  Administered 2017-12-31 (×3): 80 ug via INTRAVENOUS

## 2017-12-31 MED ORDER — ONDANSETRON HCL 4 MG/2ML IJ SOLN: 4.0000 mg | Freq: Once | INTRAMUSCULAR | Status: DC | PRN

## 2017-12-31 MED ORDER — ONDANSETRON HCL 4 MG/2ML IJ SOLN
4.0000 mg | Freq: Once | INTRAMUSCULAR | Status: AC
  Administered 2017-12-31: 4 mg via INTRAVENOUS
  Filled 2017-12-31: qty 2

## 2017-12-31 MED ORDER — MEPERIDINE HCL 50 MG/ML IJ SOLN: 6.2500 mg | INTRAMUSCULAR | Status: DC | PRN

## 2017-12-31 MED ORDER — FENTANYL CITRATE (PF) 100 MCG/2ML IJ SOLN
INTRAMUSCULAR | Status: AC
  Administered 2018-01-01: 01:00:00
  Filled 2017-12-31: qty 2

## 2017-12-31 MED ORDER — RIVAROXABAN 10 MG PO TABS
10.0000 mg | ORAL_TABLET | Freq: Every day | ORAL | Status: DC
  Administered 2018-01-01: 10 mg via ORAL
  Filled 2017-12-31: qty 1

## 2017-12-31 MED ORDER — 0.9 % SODIUM CHLORIDE (POUR BTL) OPTIME
TOPICAL | Status: DC | PRN
  Administered 2017-12-31: 1000 mL

## 2017-12-31 MED ORDER — ISOPROPYL ALCOHOL 70 % SOLN
Status: DC | PRN
  Administered 2017-12-31: 1 via TOPICAL

## 2017-12-31 MED ORDER — MORPHINE SULFATE (PF) 4 MG/ML IV SOLN
4.0000 mg | INTRAVENOUS | Status: DC | PRN
  Administered 2018-01-01: 4 mg via INTRAVENOUS
  Filled 2017-12-31: qty 1

## 2017-12-31 MED ORDER — PROPOFOL 10 MG/ML IV BOLUS
INTRAVENOUS | Status: AC
  Filled 2017-12-31: qty 20

## 2017-12-31 MED ORDER — FAT EMULSION PLANT BASED 20 % IV EMUL
240.0000 mL | INTRAVENOUS | Status: DC
  Filled 2017-12-31: qty 250

## 2017-12-31 MED ORDER — MENTHOL 3 MG MT LOZG
1.0000 | LOZENGE | OROMUCOSAL | Status: DC | PRN
  Filled 2017-12-31: qty 9

## 2017-12-31 MED ORDER — PROPOFOL 10 MG/ML IV BOLUS
INTRAVENOUS | Status: DC | PRN
  Administered 2017-12-31: 100 mg via INTRAVENOUS

## 2017-12-31 MED ORDER — DEXTROSE-NACL 5-0.45 % IV SOLN
INTRAVENOUS | Status: DC
  Administered 2017-12-31 – 2018-01-01 (×2): via INTRAVENOUS

## 2017-12-31 MED ORDER — METHOCARBAMOL 500 MG IVPB - SIMPLE MED
INTRAVENOUS | Status: AC
  Administered 2017-12-31: 500 mg
  Filled 2017-12-31: qty 50

## 2017-12-31 MED ORDER — MIDAZOLAM HCL 2 MG/2ML IJ SOLN
INTRAMUSCULAR | Status: AC
  Filled 2017-12-31: qty 2

## 2017-12-31 MED ORDER — SUGAMMADEX SODIUM 200 MG/2ML IV SOLN
INTRAVENOUS | Status: DC | PRN
  Administered 2017-12-31: 200 mg via INTRAVENOUS

## 2017-12-31 MED ORDER — ACETAMINOPHEN 325 MG PO TABS: 325.0000 mg | ORAL_TABLET | ORAL | Status: DC | PRN

## 2017-12-31 MED ORDER — METOCLOPRAMIDE HCL 5 MG/ML IJ SOLN: 5.0000 mg | Freq: Three times a day (TID) | INTRAMUSCULAR | Status: DC | PRN

## 2017-12-31 MED ORDER — OXYCODONE HCL 5 MG PO TABS
5.0000 mg | ORAL_TABLET | ORAL | Status: DC | PRN
  Administered 2017-12-31 – 2018-01-01 (×3): 5 mg via ORAL
  Filled 2017-12-31 (×3): qty 1

## 2017-12-31 SURGICAL SUPPLY — 77 items
ADH SKN CLS APL DERMABOND .7 (GAUZE/BANDAGES/DRESSINGS) ×4
BAG SPEC THK2 15X12 ZIP CLS (MISCELLANEOUS) ×2
BAG ZIPLOCK 12X15 (MISCELLANEOUS) ×3 IMPLANT
BANDAGE ACE 6X5 VEL STRL LF (GAUZE/BANDAGES/DRESSINGS) ×3 IMPLANT
BANDAGE ESMARK 6X9 LF (GAUZE/BANDAGES/DRESSINGS) ×2 IMPLANT
BIT DRILL 4.3MMS DISTAL GRDTED (BIT) IMPLANT
BNDG CMPR 9X6 STRL LF SNTH (GAUZE/BANDAGES/DRESSINGS) ×2
BNDG ESMARK 6X9 LF (GAUZE/BANDAGES/DRESSINGS) ×3
CHLORAPREP W/TINT 26ML (MISCELLANEOUS) ×3 IMPLANT
COVER MAYO STAND STRL (DRAPES) IMPLANT
COVER PERINEAL POST (MISCELLANEOUS) ×3 IMPLANT
COVER SURGICAL LIGHT HANDLE (MISCELLANEOUS) ×3 IMPLANT
CUFF TOURN SGL QUICK 34 (TOURNIQUET CUFF) ×3
CUFF TRNQT CYL 34X4X40X1 (TOURNIQUET CUFF) ×2 IMPLANT
DERMABOND ADVANCED (GAUZE/BANDAGES/DRESSINGS) ×2
DERMABOND ADVANCED .7 DNX12 (GAUZE/BANDAGES/DRESSINGS) ×4 IMPLANT
DRAPE C-ARM 42X120 X-RAY (DRAPES) ×3 IMPLANT
DRAPE C-ARMOR (DRAPES) ×3 IMPLANT
DRAPE EXTREMITY T 121X128X90 (DRAPE) IMPLANT
DRAPE OEC MINIVIEW 54X84 (DRAPES) IMPLANT
DRAPE ORTHO SPLIT 77X108 STRL (DRAPES) ×6
DRAPE POUCH INSTRU U-SHP 10X18 (DRAPES) ×3 IMPLANT
DRAPE SHEET LG 3/4 BI-LAMINATE (DRAPES) ×6 IMPLANT
DRAPE STERI IOBAN 125X83 (DRAPES) ×3 IMPLANT
DRAPE SURG ORHT 6 SPLT 77X108 (DRAPES) ×4 IMPLANT
DRAPE U-SHAPE 47X51 STRL (DRAPES) ×6 IMPLANT
DRESSING AQUACEL AG SP 3.5X6 (GAUZE/BANDAGES/DRESSINGS) IMPLANT
DRILL 4.3MMS DISTAL GRADUATED (BIT) ×3
DRSG AQUACEL AG ADV 3.5X 4 (GAUZE/BANDAGES/DRESSINGS) ×2 IMPLANT
DRSG AQUACEL AG SP 3.5X6 (GAUZE/BANDAGES/DRESSINGS) ×3
DRSG EMULSION OIL 3X16 NADH (GAUZE/BANDAGES/DRESSINGS) ×3 IMPLANT
DRSG MEPILEX BORDER 4X4 (GAUZE/BANDAGES/DRESSINGS) ×6 IMPLANT
DRSG MEPILEX BORDER 4X8 (GAUZE/BANDAGES/DRESSINGS) IMPLANT
DRSG PAD ABDOMINAL 8X10 ST (GAUZE/BANDAGES/DRESSINGS) ×3 IMPLANT
DURAPREP 26ML APPLICATOR (WOUND CARE) ×3 IMPLANT
ELECT BLADE TIP CTD 4 INCH (ELECTRODE) IMPLANT
ELECT REM PT RETURN 15FT ADLT (MISCELLANEOUS) ×3 IMPLANT
FACESHIELD WRAPAROUND (MASK) ×6 IMPLANT
FACESHIELD WRAPAROUND OR TEAM (MASK) ×4 IMPLANT
GAUZE SPONGE 4X4 12PLY STRL (GAUZE/BANDAGES/DRESSINGS) ×3 IMPLANT
GLOVE BIO SURGEON STRL SZ8.5 (GLOVE) ×6 IMPLANT
GLOVE BIOGEL PI IND STRL 7.5 (GLOVE) ×2 IMPLANT
GLOVE BIOGEL PI IND STRL 8.5 (GLOVE) ×2 IMPLANT
GLOVE BIOGEL PI INDICATOR 7.5 (GLOVE) ×1
GLOVE BIOGEL PI INDICATOR 8.5 (GLOVE) ×1
GLOVE ECLIPSE 8.0 STRL XLNG CF (GLOVE) IMPLANT
GLOVE ORTHO TXT STRL SZ7.5 (GLOVE) ×6 IMPLANT
GLOVE SURG ORTHO 8.0 STRL STRW (GLOVE) ×3 IMPLANT
GOWN SPEC L3 XXLG W/TWL (GOWN DISPOSABLE) ×6 IMPLANT
GOWN STRL REUS W/TWL LRG LVL3 (GOWN DISPOSABLE) ×3 IMPLANT
GUIDEPIN 3.2X17.5 THRD DISP (PIN) ×1 IMPLANT
GUIDEWIRE BALL NOSE 100CM (WIRE) ×1 IMPLANT
HIP FRA NAIL LAG SCREW 10.5X90 (Orthopedic Implant) ×3 IMPLANT
KIT BASIN OR (CUSTOM PROCEDURE TRAY) ×3 IMPLANT
MANIFOLD NEPTUNE II (INSTRUMENTS) ×3 IMPLANT
MARKER SKIN DUAL TIP RULER LAB (MISCELLANEOUS) ×3 IMPLANT
NAIL AFFIXUS 13X380MM (Nail) ×1 IMPLANT
NS IRRIG 1000ML POUR BTL (IV SOLUTION) ×3 IMPLANT
PACK GENERAL/GYN (CUSTOM PROCEDURE TRAY) ×3 IMPLANT
PACK TOTAL JOINT (CUSTOM PROCEDURE TRAY) ×3 IMPLANT
PADDING CAST COTTON 6X4 STRL (CAST SUPPLIES) ×3 IMPLANT
POSITIONER SURGICAL ARM (MISCELLANEOUS) ×3 IMPLANT
SCREW BONE CORTICAL 5.0X36 (Screw) IMPLANT
SCREW BONE CORTICAL 5.0X40 (Screw) ×1 IMPLANT
SCREW LAG HIP FRA NAIL 10.5X90 (Orthopedic Implant) IMPLANT
SCREWDRIVER HEX TIP 3.5MM (MISCELLANEOUS) ×1 IMPLANT
STAPLER VISISTAT 35W (STAPLE) IMPLANT
STRIP CLOSURE SKIN 1/2X4 (GAUZE/BANDAGES/DRESSINGS) ×6 IMPLANT
SUT MNCRL AB 3-0 PS2 18 (SUTURE) ×3 IMPLANT
SUT MNCRL AB 4-0 PS2 18 (SUTURE) IMPLANT
SUT VIC AB 1 CT1 36 (SUTURE) ×9 IMPLANT
SUT VIC AB 1 CTX 36 (SUTURE) ×3
SUT VIC AB 1 CTX36XBRD ANBCTR (SUTURE) IMPLANT
SUT VIC AB 2-0 CT1 27 (SUTURE) ×3
SUT VIC AB 2-0 CT1 TAPERPNT 27 (SUTURE) ×2 IMPLANT
TOWEL OR 17X26 10 PK STRL BLUE (TOWEL DISPOSABLE) ×6 IMPLANT
WATER STERILE IRR 1000ML POUR (IV SOLUTION) ×3 IMPLANT

## 2017-12-31 NOTE — Anesthesia Procedure Notes (Signed)
Date/Time: 12/31/2017 6:57 PM Performed by: Cynda Familia, CRNA Oxygen Delivery Method: Simple face mask Placement Confirmation: positive ETCO2 and breath sounds checked- equal and bilateral Dental Injury: Teeth and Oropharynx as per pre-operative assessment

## 2017-12-31 NOTE — H&P (Signed)
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 Vernard Gambles.   Per RN reports existing tunneled left IJ is non-functional at this time, patient states it was working fine prior to being admitted to the hospital. CXR from yesterday shows tip is present at junction of innominate veins. Request for evaluation and possible exchange of this line prior to possible orthopedic surgery today. Will proceed with injection of existing tunneled line with possible exchange today.  Risks and benefits discussed with the patient including, but not limited to bleeding, infection, vascular injury, pneumothorax which may require chest tube placement, air embolism or even death.  All of the patient's questions were answered, patient is agreeable to proceed.  Consent signed and in chart.  ______________________________________________________________________   History of Present Illness: [name] [name] is a 67 y.o. 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 Presented to [facility] s/p fall with left hip pain - she was found to have a minimally displaced/non-displaced left intertrochanteric proximal femur fracture. She was transported to [facility] for further evaluation; orthopedics was consulted on patient and they will be further assessing the patient today for possible surgical intervention.  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   Allergies and medications reviewed.   Review of Systems: A 12 point ROS discussed and pertinent positives are indicated in the HPI above.  All other systems are negative.  Review of Systems  Constitutional: Negative for chills and fever.  Respiratory: Negative for cough and shortness of breath.   Cardiovascular: Negative for chest pain.  Gastrointestinal: Negative for abdominal pain.  Musculoskeletal:       Left leg/hip pain  Skin: Negative for rash.  Neurological: Negative for syncope.    Vital Signs: BP (!) 117/52 (BP Location: Right Arm)   Pulse (!) 53   Temp 98.6 F (37 C) (Oral)   Resp 18   Ht 5\' 7"  (1.702 m)   Wt 139 lb 15.9 oz (63.5 kg)   LMP 02/26/2014   SpO2 98%   BMI 21.93 kg/m   Physical Exam  Constitutional: She is oriented to person, place, and time. No distress.  HENT:  Head: Normocephalic.  Cardiovascular: Normal rate, regular rhythm and normal heart sounds.  Left tunneled IJ in place; insertion site unremarkable  Pulmonary/Chest: Effort normal and breath sounds normal.  Abdominal: Soft. There is no tenderness.  Musculoskeletal: She exhibits no edema.  No edema, pain, warmth or erythema to bilateral upper extremities.  Neurological: She is alert and oriented to person, place, and time.  Skin: Skin is warm and dry. No rash noted. She is not diaphoretic.  Psychiatric: She has a normal mood and affect. Her behavior is normal. Judgment and thought content normal.  Nursing note and  vitals reviewed.    Imaging reviewed.   Labs:  COAGS: No results for input(s): INR, APTT in the last 8760 hours.  BMP: Recent Labs    11/09/17 1021 12/14/17 0856 12/30/17 1511 12/31/17 0457  NA 138 139 137 139  K 3.6 3.7 4.4 4.2  CL 110 107 108 110  CO2 22 24 21* 23   GLUCOSE 83 85 94 87  BUN 11 14 20 16   CALCIUM 8.6 8.8 8.3* 8.8*  CREATININE 0.95 1.07 0.96 0.95  GFRNONAA  --   --  60* >60  GFRAA  --   --  >60 >60       Electronically Signed: Joaquim Nam, PA-C 12/31/2017, 9:28 AM   I spent a total of 15 minutes in face to face in clinical consultation, greater than 50% of which was counseling/coordinating care for venous access.

## 2017-12-31 NOTE — Anesthesia Preprocedure Evaluation (Signed)
Anesthesia Evaluation  Patient identified by MRN, date of birth, ID band Patient awake    Reviewed: Allergy & Precautions, NPO status , Patient's Chart, lab work & pertinent test results, reviewed documented beta blocker date and time   Airway Mallampati: II  TM Distance: >3 FB Neck ROM: Full    Dental no notable dental hx. (+) Teeth Intact   Pulmonary pneumonia, resolved, former smoker,    Pulmonary exam normal breath sounds clear to auscultation       Cardiovascular hypertension, Pt. on medications and Pt. on home beta blockers + Peripheral Vascular Disease and +CHF  Normal cardiovascular exam Rhythm:Regular Rate:Normal  Carotid stenosis <50% bilaterally EF 45%   Neuro/Psych negative neurological ROS  negative psych ROS   GI/Hepatic GERD  Medicated and Controlled,Hx/o Short bowel syndrome S/P multiple small bowel resections and right hemicolectomy S/P mesenteric thrombosis   Endo/Other  negative endocrine ROSdiabetesOsteoporosis  Renal/GU Renal InsufficiencyRenal diseaseHx/o renal insufficiency  negative genitourinary   Musculoskeletal   Abdominal   Peds  Hematology  (+) anemia , Thrombocytopenia Thrombophilia   Anesthesia Other Findings   Reproductive/Obstetrics                               Chemistry      Component Value Date/Time   NA 139 12/31/2017 0457   NA 139 01/13/2014 0951   K 4.2 12/31/2017 0457   K 3.6 01/13/2014 0951   CL 110 12/31/2017 0457   CL 106 10/22/2012 1005   CO2 23 12/31/2017 0457   CO2 21 (L) 01/13/2014 0951   BUN 16 12/31/2017 0457   BUN 14.6 01/13/2014 0951   CREATININE 0.95 12/31/2017 0457   CREATININE 1.0 01/13/2014 0951      Component Value Date/Time   CALCIUM 8.8 (L) 12/31/2017 0457   CALCIUM 8.9 01/13/2014 0951   ALKPHOS 64 12/31/2017 0457   ALKPHOS 85 01/13/2014 0951   AST 20 12/31/2017 0457   AST 19 01/13/2014 0951   ALT 19 12/31/2017 0457    ALT 20 01/13/2014 0951   BILITOT 0.7 12/31/2017 0457   BILITOT 0.26 01/13/2014 0951     Lab Results  Component Value Date   WBC 5.7 12/31/2017   HGB 10.3 (L) 12/31/2017   HCT 31.9 (L) 12/31/2017   MCV 97.3 12/31/2017   PLT 129 (L) 12/31/2017    EKG: normal sinus rhythm, LBBB. Echo 09/26/13: Left ventricle: Inferior and apical hypokinesis. The cavity size was mildly dilated. Wall thickness was normal. The estimated ejection fraction was 45%. - Aortic valve: Valve area (VTI): 2.28 cm^2. Valve area (Vmax): 2.4 cm^2. - Mitral valve: There was mild regurgitation. - Atrial septum: No defect or patent foramen ovale was identified. Anesthesia Physical  Anesthesia Plan  ASA: III  Anesthesia Plan: General   Post-op Pain Management:    Induction: Intravenous  PONV Risk Score and Plan: Ondansetron, Dexamethasone and Treatment may vary due to age or medical condition  Airway Management Planned: LMA and Oral ETT  Additional Equipment:   Intra-op Plan:   Post-operative Plan: Extubation in OR  Informed Consent: I have reviewed the patients History and Physical, chart, labs and discussed the procedure including the risks, benefits and alternatives for the proposed anesthesia with the patient or authorized representative who has indicated his/her understanding and acceptance.     Plan Discussed with: Anesthesiologist, CRNA and Surgeon  Anesthesia Plan Comments: ( )  Anesthesia Quick Evaluation  

## 2017-12-31 NOTE — Consult Note (Signed)
Cardiology Consultation:   Patient ID: [name] [name]; [id number]; 10-30-1950   Admit date: 12/30/2017 Date of Consult: 12/31/2017  Primary Care Provider: [name], MD Primary Cardiologist: Dr Johnsie Cancel    Patient Profile:   [name] [name] is a 67 y.o. 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  History of Present Illness:   Patient does have a history of reduced LV function.  Last nuclear study September 2015 showed ejection fraction to 47%.  There was prior inferior and apical infarct but no ischemia.  Last echocardiogram July 2018 showed ejection fraction 40 to 45% with akinesis of the inferior and inferoapical walls.  Patient has not had cardiac catheterization.  Patient had a mechanical fall and has been found to have a left hip fracture which will require repair.  Cardiology asked to evaluate prior to surgery.  Note patient is extremely active prior to her fall.  She can walk at least 1/2 mile and climb 3 flights of stairs with no chest pain or dyspnea.  She denies palpitations or syncope.  No orthopnea, PND or pedal edema.  Past Medical History:  Diagnosis Date  . Abnormal LFTs 2013  . Allergic rhinosinusitis   . Anemia of chronic disease   . At risk for dental problems   . Atypical nevus   . Bacteremia due to Klebsiella pneumoniae 12/12/2011  . Bacterial overgrowth syndrome   . 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 Carotid stenosis    Carotid US (9/15):  R 40-59%; L 1-39% >> FU 1 year  . Congestive heart failure (CHF) (HCC)    EF 25-30%  . 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 Fracture of left clavicle   . History of blood transfusion 2013   anemia  .  Hx of cardiovascular stress test    Myoview (9/15):  inf-apical scar; no ischemia; EF 47% - low risk   . 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 Osteoporosis   . Pancytopenia 10/07/2011  . Pathologic fracture of neck of femur ([place])   . Personal history of colonic polyps   . Renal insufficiency    hx of yrs ago  . Serratia marcescens infection - bactermia assoc w/ PICC 01/18/2015  . 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 Splenomegaly    By ultrasound  . Thrombophilia ([place])   . Vitamin D deficiency     Past Surgical History:  Procedure Laterality Date  . APPENDECTOMY  yrs ago  . CHOLECYSTECTOMY  yrs ago  . COLONOSCOPY  12/05/2005   internal hemorrhoids (for polyp surveillance)  . COLONOSCOPY  04/26/2012   Procedure: COLONOSCOPY;  Surgeon: Gatha Mayer, MD;  Location: [facility] ENDOSCOPY;  Service: Endoscopy;  Laterality: N/A;  . COLONOSCOPY WITH PROPOFOL N/A 03/18/2016   Procedure: COLONOSCOPY WITH PROPOFOL;  Surgeon: Gatha Mayer, MD;  Location: [facility] ENDOSCOPY;  Service: Endoscopy;  Laterality: N/A;  . ESOPHAGOGASTRODUODENOSCOPY  01/22/2009   erosive esophagitis  . IR GENERIC HISTORICAL  07/04/2016   IR REMOVAL TUN CV CATH W/O FL 07/04/2016 Ascencion Dike, PA-C [facility]INTER[facility]  . IR GENERIC HISTORICAL  07/10/2016   IR US GUIDE VASC ACCESS LEFT 07/10/2016 Arne Cleveland,  MD [facility]  . IR GENERIC HISTORICAL  07/10/2016   IR FLUORO GUIDE CV LINE LEFT 07/10/2016 Arne Cleveland, MD [facility]  . IR PATIENT EVAL TECH 0-60 MINS  10/07/2017  . IR RADIOLOGIST EVAL & MGMT  07/21/2017  . ORIF PROXIMAL FEMORAL FRACTURE W/ ITST NAIL SYSTEM  03/2007   left, Dr. Shellia Carwin  . SMALL INTESTINE SURGERY  2005   multiple with right colon resection for ischemia/infarct      Inpatient Medications: Scheduled Meds: . pantoprazole  40 mg Oral Daily   Continuous Infusions: . dextrose 5 % and 0.45% NaCl 100 mL/hr at 12/31/17 1008  . methocarbamol (ROBAXIN) IV      PRN Meds: albuterol, methocarbamol **OR** methocarbamol (ROBAXIN) IV, morphine injection, oxyCODONE  Allergies:    Allergies  Allergen Reactions  . Iohexol Itching and Other (See Comments)    Pt is able to use IVP dye if taken with Benadryl.    Marland Kitchen Penicillins Itching, Swelling and Other (See Comments)    Reaction:  Facial swelling Has patient had a PCN reaction causing immediate rash, facial/tongue/throat swelling, SOB or lightheadedness with hypotension: Yes Has patient had a PCN reaction causing severe rash involving mucus membranes or skin necrosis: No Has patient had a PCN reaction that required hospitalization No Has patient had a PCN reaction occurring within the last 10 years: No If all of the above answers are "NO", then may proceed with Cephalosporin use.    Social History:   Social History   Socioeconomic History  . Marital status: Married    Spouse name: Not on file  . Number of children: Not on file  . Years of education: Not on file  . Highest education level: Not on file  Occupational History  . Not on file  Social Needs  . Financial resource strain: Somewhat hard  . Food insecurity:    Worry: Never true    Inability: Never true  . Transportation needs:    Medical: No    Non-medical: No  Tobacco Use  . Smoking status: Former Smoker    Types: Cigarettes    Last attempt to quit: 05/12/2004    Years since quitting: 13.6  . Smokeless tobacco: Never Used  Substance and Sexual Activity  . Alcohol use: No  . Drug use: No  . Sexual activity: Yes    Comment: 1st intercourse 26 yo-5 partners  Lifestyle  . Physical activity:    Days per week: 7 days    Minutes per session: 20 min  . Stress: Not at all  Relationships  . Social connections:    Talks on phone: More than three times a week    Gets together: Three times a week    Attends religious service: 1 to 4 times per year    Active member of club or organization: No    Attends meetings of clubs or  organizations: Never    Relationship status: Married  . Intimate partner violence:    Fear of current or ex partner: Not on file    Emotionally abused: Not on file    Physically abused: Not on file    Forced sexual activity: Not on file  Other Topics Concern  . Not on file  Social History Narrative   Married to Herbie [place], has 1 daughter and a granddaughter the patient's son died in childhood due to a motor vehicle wreck   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   No alcohol  tobacco or drug use   04/07/2017    Family History:    Family History  Problem Relation Age of Onset  . Diabetes Mother   . Hypertension Mother   . AAA (abdominal aortic aneurysm) Mother   . Colon cancer Neg Hx   . Stomach cancer Neg Hx      ROS:  Please see the history of present illness.  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 All other ROS reviewed and negative.     Physical Exam/Data:   Vitals:   12/30/17 1955 12/30/17 2226 12/31/17 0018 12/31/17 0535  BP: (!) 90/49  100/64 (!) 117/52  Pulse: (!) 50  (!) 55 (!) 53  Resp: 18  18 18   Temp: 98.4 F (36.9 C)  99.2 F (37.3 C) 98.6 F (37 C)  TempSrc: Oral   Oral  SpO2: 99%  96% 98%  Weight:  63.5 kg    Height:        Intake/Output Summary (Last 24 hours) at 12/31/2017 1051 Last data filed at 12/31/2017 0554 Gross per 24 hour  Intake 551.95 ml  Output -  Net 551.95 ml   Filed Weights   12/30/17 1340 12/30/17 2226  Weight: 63.5 kg 63.5 kg   Body mass index is 21.93 kg/m.  General:  Well nourished, well developed, in no acute distress HEENT: normal Lymph: no adenopathy Neck: no JVD Endocrine:  No thryomegaly Vascular: No carotid bruits; FA pulses 2+ bilaterally without bruits  Cardiac:  normal S1, S2; RRR; 2/6 systolic ejection murmur Lungs:  clear to auscultation bilaterally, no wheezing, rhonchi or rales  Abd: soft, nontender, no hepatomegaly,  prior abdominal surgery Ext: no edema Musculoskeletal:  No deformities, BUE and BLE strength normal and equal Skin: warm and dry  Neuro:  CNs 2-12 intact, no focal abnormalities noted Psych:  Normal affect   EKG:  The EKG was personally reviewed and demonstrates: Sinus bradycardia with first-degree AV block and left bundle branch block. Telemetry:  Telemetry was personally reviewed and demonstrates: Sinus bradycardia with occasional PVC.  Laboratory Data:  Chemistry Recent Labs  Lab 12/30/17 1511 12/31/17 0457  NA 137 139  K 4.4 4.2  CL 108 110  CO2 21* 23  GLUCOSE 94 87  BUN 20 16  CREATININE 0.96 0.95  CALCIUM 8.3* 8.8*  GFRNONAA 60* >60  GFRAA >60 >60  ANIONGAP 8 6    Recent Labs  Lab 12/31/17 0457  PROT 6.9  ALBUMIN 3.1*  AST 20  ALT 19  ALKPHOS 64  BILITOT 0.7   Hematology Recent Labs  Lab 12/30/17 1511 12/31/17 0555  WBC 7.1 5.7  RBC 3.47* 3.28*  HGB 11.0* 10.3*  HCT 33.8* 31.9*  MCV 97.4 97.3  MCH 31.7 31.4  MCHC 32.5 32.3  RDW 14.9 14.7  PLT 123* 129*   Cardiac Enzymes Recent Labs  Lab 12/30/17 2045 12/30/17 2246 12/31/17 0457  TROPONINI 0.10* 0.09* 0.10*   Radiology/Studies:  Ct Hip Left Wo Contrast  Result Date: 12/30/2017 CLINICAL DATA:  Fall today, left hip pain. EXAM: CT OF THE LEFT HIP WITHOUT CONTRAST TECHNIQUE: Multidetector CT imaging of the left hip was performed according to the standard protocol. Multiplanar CT image reconstructions were also generated. COMPARISON:  12/30/2017 FINDINGS: Bones/Joint/Cartilage Three cannulated screws extend through the right femoral neck and into the femoral head. There is an acute nondisplaced intertrochanteric fracture most readily visible along the anterior cortex for example on images 29 through 34 of  series 3. Difficult to exclude a component extending below the screw margin although the dominant morphology appears to be intertrochanteric rather than subtrochanteric. Lipohemarthrosis in the hip.  Ligaments Suboptimally assessed by CT. Muscles and Tendons Suspected hematoma within and along the distal iloipsoas, vastus intermedius, and vastus medialis muscles proximally. Soft tissues 3.4 cm fluid density lesion of the left ovary, probably benign. IMPRESSION: 1. Nondisplaced left intertrochanteric fracture potentially with a small sub trochanteric component, nondisplaced and thus difficult to visualize. One unusual feature is the presence of a hip lipohemarthrosis, unusual given that this should normally be an extra-articular fracture and raises the question as to whether there is an occult intra-articular component such as a femoral neck component. There are 3 cannulated screws extending through the femoral neck which do not appear fractured. There is also some indistinct hematoma tracking within along the anterior proximal thigh musculature including the iloipsoas, vastus intermedius, and vastus lateralis. 2. 3.4 cm likely benign fluid density lesion of the left ovary. Based on current guidelines, follow pelvic sonography is recommended. However, I feel that this can be deferred until the patient's acute hip problems have been addressed. This recommendation follows ACR consensus guidelines: White Paper of the ACR Incidental Findings Committee II on Adnexal Findings. J Am Coll Radiol [phone]. Electronically Signed   By: Van Clines M.D.   On: 12/30/2017 16:13   Chest Portable 1 View  Result Date: 12/30/2017 CLINICAL DATA:  Preoperative evaluation for upcoming hip surgery EXAM: PORTABLE CHEST 1 VIEW COMPARISON:  None. FINDINGS: Cardiac shadow is enlarged. Left jugular PICC line is noted with the catheter tip at the junction of the innominate veins. Lungs are clear bilaterally. No pneumothorax is noted. IMPRESSION: No acute abnormality noted. Electronically Signed   By: Inez Catalina M.D.   On: 12/30/2017 21:36   Dg Hip Unilat W Or Wo Pelvis 2-3 Views Left  Result Date: 12/30/2017 CLINICAL  DATA:  Fall.  Hip pain EXAM: DG HIP (WITH OR WITHOUT PELVIS) 2-3V LEFT COMPARISON:  None. FINDINGS: 3 pins in the left femoral neck. Avulsion fracture of the greater tuberosity appears acute. Fracture may extend across proximal femur into the lesser trochanter. Left hip joint normal.  No pelvic fracture.  Right hip normal. IMPRESSION: 3 pins in the left femoral neck. Acute nondisplaced fracture of the greater trochanter which may extend across the femur. Recommend CT left hip to further evaluate fracture. Electronically Signed   By: Franchot Gallo M.D.   On: 12/30/2017 14:45    Assessment and Plan:   1. Preoperative evaluation prior to repair of left hip fracture-patient has a history of inferior infarct but last nuclear study showed infarct and no ischemia.  She had excellent functional capacity walking 1/2 mile and climbing 3 flights of stairs with no symptoms of dyspnea or chest pain.  She may proceed with surgical repair of her hip fracture without further cardiac evaluation.  Her risk will be mildly elevated because of history of infarct. 2. History of hypercoagulable state with prior mesenteric infarct-hold Xarelto prior to surgery.  Resume postoperatively when hemostasis achieved. 3. Coronary artery disease-would avoid aspirin given need for Xarelto.  Would ask patient to follow-up with Dr. Johnsie Cancel following discharge and consideration of statin at that time given coronary disease. 4. Status post hip fracture-management per orthopedic surgery. 5. Mildly elevated troponin-unclear why enzymes were drawn.  She is not having chest pain and there is no clear trend.  No further ischemia evaluation. 6. Acquired short bowel syndrome-per internal  medicine.  [facility] will sign off.   Medication Recommendations: Would continue preadmission medications at discharge including Xarelto.  Would not continue aspirin. Other recommendations (labs, testing, etc): No further recommendations. Follow up as an  outpatient: Follow-up Dr. Johnsie Cancel 3 months. For questions or updates, please contact [place] Please consult [email or web address] for contact info under Cardiology/STEMI.   Signed, Kirk Ruths, MD  12/31/2017 10:51 AM

## 2017-12-31 NOTE — Progress Notes (Signed)
There is a request form on chart  from [employer] for pt's TPN. This information will need to be faxed to [facility]. Also authorization to release information sheet is on chart.

## 2017-12-31 NOTE — Interval H&P Note (Signed)
History and Physical Interval Note:  12/31/2017 3:44 PM  [name] [name]  has presented today for surgery, with the diagnosis of Fracture left Hip  The various methods of treatment have been discussed with the patient and family. After consideration of risks, benefits and other options for treatment, the patient has consented to  Procedure(s): INTRAMEDULLARY (IM) NAIL INTERTROCHANTRIC (Left) HARDWARE REMOVAL (Left) as a surgical intervention .  The patient's history has been reviewed, patient examined, no change in status, stable for surgery.  I have reviewed the patient's chart and labs.  Questions were answered to the patient's satisfaction.    The risks, benefits, and alternatives were discussed with the patient. There are risks associated with the surgery including, but not limited to, problems with anesthesia (death), infection, differences in leg length/angulation/rotation, fracture of bones, loosening or failure of implants, malunion, nonunion, hematoma (blood accumulation) which may require surgical drainage, blood clots, pulmonary embolism, nerve injury (foot drop), and blood vessel injury. The patient understands these risks and elects to proceed.  I was asked to take over care by Dr. Veverly Fells.  The patient has retained left femoral neck screws with old healed femoral neck fracture.  She had a fall.  X-rays and CT scan showed a nondisplaced complete sub-trochanteric fracture.  The patient understands that this fracture is extremely high risk for displacement.  The fracture will require hardware removal and intramedullary fixation for appropriate treatment.  We did discuss the risks, benefits, and alternatives.  Please see statement of risk.  Patient does have a complicated past medical history, however I think the fracture will do very poorly with nonsurgical management.   [name] [name] [name] [name]

## 2017-12-31 NOTE — Progress Notes (Signed)
Patient Demographics:    [name] [name], is a 67 y.o. female, DOB - [birth year], PHX:[id number]  Admit date - 12/30/2017   Admitting Physician Toy Baker, MD  Outpatient Primary MD for the patient is Birdie Riddle Aundra Millet, MD  LOS - 1   Chief Complaint  Patient presents with  . 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 Shellia Carwin  Plan:- 1)Lt Hip Fx - for ORIF on 12/31/17, preop clearance from cardiology appreciated  2) acquired short bowel syndrome with malnutrition and failure to thrive-----gets TPN usually for 8 hours overnight from Mondays through Thursdays, awaiting interventional radiology decision on PICC line status  3) anemia of chronic disease----stable at this time, hemoglobin 10.3, suspect significant nutritional components to patient's anemia monitor and transfuse as clinically indicated  4) hypercoagulable state--- Xarelto on hold to allow for ortho surgery  Code Status : full    Disposition Plan  : home with [facility]   Consults  :   ortho   DVT Prophylaxis  :  SCDs , Xarelto is on hold  Lab Results  Component Value Date   PLT 129 (L) 12/31/2017    Inpatient Medications  Scheduled Meds: . [MAR Hold] pantoprazole  40 mg Oral Daily   Continuous Infusions: . dextrose 5 % and 0.45% NaCl Stopped (12/31/17 1525)  . lactated ringers 100 mL/hr at 12/31/17 1525  . [MAR Hold] methocarbamol (ROBAXIN) IV     PRN Meds:.acetaminophen **OR** acetaminophen (TYLENOL) oral liquid 160 mg/5 mL, [MAR Hold] albuterol, fentaNYL (SUBLIMAZE) injection, meperidine (DEMEROL) injection, [MAR Hold] methocarbamol **OR** [MAR Hold] methocarbamol (ROBAXIN) IV, [MAR Hold]  morphine injection, ondansetron (ZOFRAN) IV, [MAR Hold] oxyCODONE, oxyCODONE **OR** oxyCODONE    Anti-infectives (From admission, onward)   Start     Dose/Rate Route Frequency Ordered Stop   01/01/18 0600  clindamycin (CLEOCIN) IVPB 900 mg  Status:  Discontinued     900 mg 100 mL/hr over 30 Minutes Intravenous On call to O.R. 12/31/17 1242 12/31/17 1530   12/31/17 1545  clindamycin (CLEOCIN) IVPB 900 mg     900 mg 100 mL/hr over 30 Minutes Intravenous On call to O.R. 12/31/17 1530 12/31/17 1737        Objective:   Vitals:   12/31/17 1511 12/31/17 1911 12/31/17 1915 12/31/17 1930  BP: (!) 121/54 (!) 107/54 (!) 120/58 (!) 114/54  Pulse: (!) 53 63  65 (!) 58  Resp: (!) 97 16 12 16   Temp: 99.5 F (37.5 C) 97.6 F (36.4 C)    TempSrc: Oral     SpO2:  92% 94% 98%  Weight:      Height:        Wt Readings from Last 3 Encounters:  12/30/17 63.5 kg  11/19/17 65.8 kg  11/19/17 65.8 kg     Intake/Output Summary (Last 24 hours) at 12/31/2017 1934 Last data filed at 12/31/2017 1910 Gross per 24 hour  Intake 1977.08 ml  Output 500 ml  Net 1477.08 ml     Physical Exam  Gen:- Awake Alert,  In no apparent distress  HEENT:- [place].AT, No sclera icterus Neck-Supple Neck,No JVD,.  Lungs-  CTAB , chest wall with PICC line/ central catheter CV- S1, S2 normal Abd-   +ve B.Sounds, Abd Soft, No tenderness,    Extremity/Skin:-Good pulses, left hip tenderness with palpation and range of motion Psych-affect is appropriate, oriented x3 Neuro-no new focal deficits, no tremors   Data Review:   Micro Results No results found for this or any previous visit (from the past 240 hour(s)).  Radiology Reports Ct Hip Left Wo Contrast  Result Date: 12/30/2017 CLINICAL DATA:  Fall today, left hip pain. EXAM: CT OF THE LEFT HIP WITHOUT CONTRAST TECHNIQUE: Multidetector CT imaging of the left hip was performed according to the standard protocol. Multiplanar CT image reconstructions were also generated. COMPARISON:  12/30/2017 FINDINGS: Bones/Joint/Cartilage Three cannulated screws extend through the right femoral neck and into the femoral head. There is an acute nondisplaced intertrochanteric fracture most readily visible along the anterior cortex for example on images 29 through 34 of series 3. Difficult to exclude a component extending below the screw margin although the dominant morphology appears to be intertrochanteric rather than subtrochanteric. Lipohemarthrosis in the hip. Ligaments Suboptimally assessed by CT. Muscles and Tendons Suspected hematoma within and along the distal iloipsoas, vastus intermedius, and vastus medialis muscles proximally. Soft tissues 3.4 cm fluid density lesion of the left ovary, probably benign. IMPRESSION: 1. Nondisplaced left intertrochanteric fracture potentially with a small sub trochanteric component, nondisplaced and thus difficult to visualize. One unusual feature is the presence of a hip lipohemarthrosis, unusual given that this should normally be an extra-articular fracture and raises the question as to whether there is an occult intra-articular component such as a femoral neck component. There are 3 cannulated screws extending through the femoral neck which do not appear fractured. There is also some indistinct hematoma tracking within along  the anterior proximal thigh musculature including the iloipsoas, vastus intermedius, and vastus lateralis. 2. 3.4 cm likely benign fluid density lesion of the left ovary. Based on current guidelines, follow pelvic sonography is recommended. However, I feel that this can be deferred until the patient's acute hip problems have been addressed. This recommendation follows ACR consensus guidelines: White Paper of the ACR Incidental Findings Committee II on Adnexal Findings. J Am Coll Radiol [phone]. Electronically Signed   By: Van Clines M.D.   On: 12/30/2017 16:13   Chest Portable 1 View  Result Date: 12/30/2017 CLINICAL DATA:  Preoperative evaluation for upcoming hip surgery EXAM: PORTABLE CHEST 1 VIEW COMPARISON:  None. FINDINGS: Cardiac shadow is enlarged. Left jugular PICC line is noted with the catheter tip at the junction of the innominate veins. Lungs are clear bilaterally. No pneumothorax is noted. IMPRESSION: No acute abnormality noted. Electronically Signed   By: Inez Catalina M.D.   On: 12/30/2017 21:36  Dg C-arm 1-60 Min-no Report  Result Date: 12/31/2017 Fluoroscopy was utilized by the requesting physician.  No radiographic interpretation.   Dg Hip Unilat W Or Wo Pelvis 2-3 Views Left  Result Date: 12/30/2017 CLINICAL DATA:  Fall.  Hip pain EXAM: DG HIP (WITH OR WITHOUT PELVIS) 2-3V LEFT COMPARISON:  None. FINDINGS: 3 pins in the left femoral neck. Avulsion fracture of the greater tuberosity appears acute. Fracture may extend across proximal femur into the lesser trochanter. Left hip joint normal.  No pelvic fracture.  Right hip normal. IMPRESSION: 3 pins in the left femoral neck. Acute nondisplaced fracture of the greater trochanter which may extend across the femur. Recommend CT left hip to further evaluate fracture. Electronically Signed   By: Franchot Gallo M.D.   On: 12/30/2017 14:45     CBC Recent Labs  Lab 12/30/17 1511 12/31/17 0555  WBC 7.1 5.7  HGB 11.0* 10.3*   HCT 33.8* 31.9*  PLT 123* 129*  MCV 97.4 97.3  MCH 31.7 31.4  MCHC 32.5 32.3  RDW 14.9 14.7  LYMPHSABS  --  2.0  MONOABS  --  1.0  EOSABS  --  0.3  BASOSABS  --  0.0    Chemistries  Recent Labs  Lab 12/30/17 1511 12/31/17 0457  NA 137 139  K 4.4 4.2  CL 108 110  CO2 21* 23  GLUCOSE 94 87  BUN 20 16  CREATININE 0.96 0.95  CALCIUM 8.3* 8.8*  MG 1.8 2.0  AST  --  20  ALT  --  19  ALKPHOS  --  64  BILITOT  --  0.7   ------------------------------------------------------------------------------------------------------------------ Recent Labs    12/31/17 0459  TRIG 149    No results found for: HGBA1C ------------------------------------------------------------------------------------------------------------------ Recent Labs    12/31/17 0459  TSH 2.942   ------------------------------------------------------------------------------------------------------------------ No results for input(s): VITAMINB12, FOLATE, FERRITIN, TIBC, IRON, RETICCTPCT in the last 72 hours.  Coagulation profile No results for input(s): INR, PROTIME in the last 168 hours.  No results for input(s): DDIMER in the last 72 hours.  Cardiac Enzymes Recent Labs  Lab 12/30/17 2246 12/31/17 0457 12/31/17 1131  TROPONINI 0.09* 0.10* 0.09*   ------------------------------------------------------------------------------------------------------------------ No results found for: BNP   Roxan Hockey M.D on 12/31/2017 at 7:34 PM   Go to [email or web address] - password [email or web address] for contact info  [facility] - Office  [phone]

## 2017-12-31 NOTE — Progress Notes (Addendum)
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  Insulin Requirements: none  Current Nutrition:  chronic TPN. NPO for possible surgery  IVF: D5 1/2 NS at 100 ml/hr ordered per MD  Central access: tunneled left IJ in place PTA - non functional at this time TPN start date: chronic TPN at home  ASSESSMENT                                                                                                          HPI: 60 y/oF on chronic TPN at home for several years for acquired short bowel syndrome secondary to bowel resection who presented to [facility] after sustaining a mechanical fall and left hip pain. CT concerning for periprosthetic left hip fracture, and patient transferred to [facility] for management by orthopedics. Pharmacy asked to assist with TPN management while patient hospitalized.   Significant events:  8/22: Plan injection of existing tunneled line with possible exchange today. Ortho consult pending for evaluation of non-displaced intertrochanteric/subtrochanteric femur fracture.   Today:    Glucose - 87 on BMET   Electrolytes - all WNL including corrected Ca   Renal - SCr WNL  LFTs - WNL  TGs - 149   Prealbumin - 16 (8/5), 14.8 (8/22)  NUTRITIONAL GOALS                                                                                             Nutritional Goals per RD 8/22: Kcal:  1750-1950 kcal, Protein:  75-90 grams  Home TPN regimen provides: 1853 Kcal/day and 62g/day protein -patient infuses TPN four nights a week (Monday, Tuesday, Wednesday, Thursday)  - 1844mL over 8 hours with one hour taper up (142ml/hr) and one hour taper down (150ml/hr), 294ml/hr x 6 hrs inbetween  Clinimix E 5/20 at goal  62ml/hr + 20% Lipids at 1ml/hr over 12hrs will provide 78 grams protein, 1853 kcal per day  PLAN  Per IR, plan injection of existing tunneled line with possible exchange today.  If successful, will proceed with TPN administration tonight.   Start Clinimix E 5/15 at 31ml/hr.  20% fat emulsion at 66ml/hr over 12hr.  TNA to contain standard multivitamins and trace elements.  Reduce IVF to 7ml/hr.  TNA lab panels on Mondays & Thursdays.  F/u daily - patient only receives TPN Mondays through Thursdays   Rodriquez Thorner, PharmD, BCPS Pager: [phone] 12/31/2017 12:26 PM   Addendum: Informed by RN that central line exchange will not occur until tomorrow, therefore, will not administer TPN today. Dr. Denton Brick aware.  Peggyann Juba, PharmD, BCPS Pager: [phone] 12/31/2017 2:05 PM

## 2017-12-31 NOTE — Op Note (Signed)
OPERATIVE REPORT  SURGEON: Rod Can, MD   ASSISTANT: Darol Destine, [place].  PREOPERATIVE DIAGNOSIS: Left subrochanteric femur fracture. Retained hardware, left femoral neck.  POSTOPERATIVE DIAGNOSIS: Same.  PROCEDURE: 1.  Removal of deep implant left femoral neck. 2.  Intramedullary fixation, Left femur.   IMPLANTS: Biomet Affixus Hip Fracture Nail, 13 by 380 mm, 130 degrees. 10.5 x 90 mm Hip Fracture Nail Lag Screw. 5 x 40 mm distal interlocking screw 1.  EXPLANTS: 6.5 mm titanium cannulated screws x3.  ANESTHESIA:  General  ESTIMATED BLOOD LOSS:-50 mL    ANTIBIOTICS: 2 g Ancef.  DRAINS: None.  COMPLICATIONS: None.   CONDITION: PACU - hemodynamically stable.   BRIEF CLINICAL NOTE: [name] [name] is a 67 y.o. female who underwent cannulated pinning of left femoral neck fracture about 10 years ago that has subsequently healed.  Yesterday, she sustained a ground-level fall and landed on her left hip.  She had left hip pain and inability to weight-bear.  She came to the emergency department at [facility], where x-rays and a CT scan revealed 3 retained screws in the femoral neck and an acute nondisplaced subtrochanteric femur fracture.  The patient was admitted to the hospitalist service and underwent perioperative risk stratification and medical optimization. The risks, benefits, and alternatives to the procedure were explained, and the patient elected to proceed. We waited until she was off Xarelto for 48 hours.  PROCEDURE IN DETAIL: Surgical site was marked by myself. The patient was taken to the operating room and general anesthesia was induced on the bed. The patient was then transferred to the [facility] table and the nonoperative lower extremity was scissored underneath the operative side. The extremity was positioned with appropriate traction, internal rotation, and adduction. The hip was prepped and draped in the normal sterile surgical fashion. Timeout was called  verifying side and site of surgery. Preop antibiotics were given with 60 minutes of beginning the procedure.  Fluoroscopy was used to define the patient's anatomy.  I located her retained femoral neck screws with live fluoroscopic control.  I made a 3 cm incision in line with the screws.  Blunt dissection was performed until I reached the IT band, which I split in line with fibers.  I then placed a guidepin into a screw, which was then subsequently removed.  I performed this for all 3 screws.  A 4 cm incision was made just proximal to the tip of the greater trochanter. The awl was used to obtain the standard starting point for a trochanteric entry nail under fluoroscopic control. The guidepin was placed. The entry reamer was used to open the proximal femur.  I placed the guidewire to the level of the physeal scar of the knee. I measured the length of the guidewire. Sequential reaming was performed up to a size 14.5 mm with excellent chatter. Therefore, a size 13 by 380 mm nail was selected and assembled to the jig on the back table. The nail was placed without any difficulty. Through a separate stab incision, the cannula was placed down to the bone in preparation for the cephalomedullary device. A guidepin was placed into the femoral head using AP and lateral fluoroscopy views. The pin was measured, and then reaming was performed to the appropriate depth. The lag screw was inserted to the appropriate depth. The fracture was compressed through the jig. The setscrew was tightened and then loosened one quarter turn. Using perfect circle technique, a distal interlocking screw was placed. The jig was  removed. Final AP and lateral fluoroscopy views were obtained to confirm fracture reduction and hardware placement. Tip apex distance was appropriate. There was no chondral penetration.  The wounds were copiously irrigated with saline. The wound was closed in layers with #1 Vicryl for the fascia, 2-0 Monocryl for the  deep dermal layer, and 3-0 Monocryl subcuticular stitch. Glue was applied to the skin. Once the glue was fully hardened, sterile dressing was applied. The patient was then awakened from anesthesia and taken to the PACU in stable condition. Sponge needle and instrument counts were correct at the end of the case 2. There were no known complications.  We will readmit the patient to the hospitalist. Weightbearing status will be weightbearing as tolerated with a walker. We will resume Xarelto at 10 mg daily for the next 48 to 72 hours, and then she may resume her normal home dose.  The patient will work with physical therapy and undergo disposition planning.  Please note that a surgical assistant was a medical necessity for this procedure to perform it in a safe and expeditious manner. Assistant was necessary to provide appropriate retraction of vital neurovascular structures, to prevent femoral fracture, and to allow for anatomic placement of the nail.

## 2017-12-31 NOTE — Progress Notes (Signed)
Initial Nutrition Assessment  DOCUMENTATION CODES:   Not applicable  INTERVENTION:   RD TPN recommendations: Clinimix 5/20 @ goal rate of 65 ml/hr + 20% ILE @ 20 ml/hr x 12 hrs to provide 1853 kcal and 78 grams protein. Meets 100% of needs.    NUTRITION DIAGNOSIS:   Increased nutrient needs related to hip fracture, post-op healing as evidenced by estimated needs.  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 Presents this admission after a mechanical fall resulting in a left hip fracture.    Pt for surgical repair today. Awaiting replacement of PICC line to begin TPN.   At home pt receives TPN infusion Monday, Tuesday, and Thursday through [employer]. She infuses 1867mL over 8 hours with one hour taper up and one hour taper down. This regimen provides: 1853 Kcal/day and 62g/day protein. Discussed using our TPN continuously while in the hospital, pt is amendable to this. Discussed if pt has extended stay, we may look into transitioning to cyclic. RD to increase protein with our TPN to aid in post op healing.   Pt had a great appetite PTA consuming 2- 8 meals per day on a regular diet. She endorses eating a hot dog without complication before being admitted yesterday. She is eager to eat but remains NPO for surgery.  Pt endorses a UBW 140 lb and denies any recent wt loss. Records indicate pt weighed 150 lb 10/09/17 and 140 lb this admission (insignificant amount of wt loss for time frame). Nutrition-Focused physical exam completed.   Medications reviewed and include: D5 @ 100 ml/hr Labs reviewed.   NUTRITION -  FOCUSED PHYSICAL EXAM:    Most Recent Value  Orbital Region  No depletion  Upper Arm Region  Mild depletion  Thoracic and Lumbar Region  Unable to assess  Buccal Region  No depletion  Temple Region  Mild depletion  Clavicle Bone Region  Mild depletion  Clavicle and Acromion Bone Region  Mild depletion  Scapular Bone Region  Unable to assess  Dorsal Hand  Mild depletion  Patellar Region  Moderate depletion  Anterior Thigh Region  Moderate depletion  Posterior Calf Region  Moderate depletion  Edema (RD Assessment)  None  Hair  Reviewed  Eyes  Reviewed  Mouth  Reviewed  Skin  Reviewed  Nails  Reviewed     Diet Order:   Diet Order            Diet NPO time specified Except for: Sips with Meds  Diet effective midnight        Diet NPO time specified Except for: Sips with Meds, Ice Chips  Diet effective midnight              EDUCATION NEEDS:   Education needs have been addressed  Skin:  Skin Assessment: Reviewed RN Assessment  Last BM:  12/31/17  Height:   Ht Readings from Last 1 Encounters:  12/30/17 5\' 7"  (1.702 m)    Weight:   Wt Readings from Last 1 Encounters:  12/30/17 63.5 kg    Ideal Body Weight:  61.4 kg  BMI:  Body mass index  is 21.93 kg/m.  Estimated Nutritional Needs:   Kcal:  1750-1950 kcal  Protein:  75-90 grams  Fluid:  >/= 1.7 L/day  Mariana Single RD, LDN Clinical Nutrition Pager # - [phone]

## 2017-12-31 NOTE — Anesthesia Procedure Notes (Signed)

## 2017-12-31 NOTE — Progress Notes (Signed)
Unable to assess pt.'s PICC,pt. REFUSED to let IV Team Nurse to check site. Primary Nurse at bedside ,witnessed pt.'s refusal. Pt. Wanted to take care of her own PICC. Dr. Roel Cluck notified.

## 2017-12-31 NOTE — Transfer of Care (Signed)
Immediate Anesthesia Transfer of Care Note  Patient: [name] [name]  Procedure(s) Performed: INTRAMEDULLARY (IM) NAIL SUBTROCHANTRIC (Left Leg Upper) HARDWARE REMOVAL (Left Hip)  Patient Location: PACU  Anesthesia Type:General  Level of Consciousness: sedated  Airway & Oxygen Therapy: Patient Spontanous Breathing and Patient connected to face mask oxygen  Post-op Assessment: Report given to RN and Post -op Vital signs reviewed and stable  Post vital signs: Reviewed and stable  Last Vitals:  Vitals Value Taken Time  BP 107/54 12/31/2017  7:10 PM  Temp    Pulse 63 12/31/2017  7:11 PM  Resp 16 12/31/2017  7:11 PM  SpO2 89 % 12/31/2017  7:11 PM  Vitals shown include unvalidated device data.  Last Pain:  Vitals:   12/31/17 1511  TempSrc: Oral  PainSc: 5       Patients Stated Pain Goal: 3 (47/42/59 5638)  Complications: No apparent anesthesia complications

## 2018-01-01 ENCOUNTER — Encounter (HOSPITAL_COMMUNITY): Payer: Self-pay | Admitting: Anesthesiology

## 2018-01-01 ENCOUNTER — Encounter (HOSPITAL_COMMUNITY): Payer: Self-pay | Admitting: Orthopedic Surgery

## 2018-01-01 ENCOUNTER — Inpatient Hospital Stay (HOSPITAL_COMMUNITY): Payer: Medicare Other

## 2018-01-01 HISTORY — PX: IR FLUORO GUIDE CV LINE LEFT: IMG2282

## 2018-01-01 LAB — CBC
HCT: 28 % — ABNORMAL LOW (ref 36.0–46.0)
HEMOGLOBIN: 9.1 g/dL — AB (ref 12.0–15.0)
MCH: 31.6 pg (ref 26.0–34.0)
MCHC: 32.5 g/dL (ref 30.0–36.0)
MCV: 97.2 fL (ref 78.0–100.0)
Platelets: 119 10*3/uL — ABNORMAL LOW (ref 150–400)
RBC: 2.88 MIL/uL — AB (ref 3.87–5.11)
RDW: 14.7 % (ref 11.5–15.5)
WBC: 8 10*3/uL (ref 4.0–10.5)

## 2018-01-01 LAB — PHOSPHORUS: Phosphorus: 5.5 mg/dL — ABNORMAL HIGH (ref 2.5–4.6)

## 2018-01-01 LAB — VITAMIN D 25 HYDROXY (VIT D DEFICIENCY, FRACTURES): Vit D, 25-Hydroxy: 12.1 ng/mL — ABNORMAL LOW (ref 30.0–100.0)

## 2018-01-01 LAB — BASIC METABOLIC PANEL
ANION GAP: 6 (ref 5–15)
BUN: 19 mg/dL (ref 8–23)
CO2: 23 mmol/L (ref 22–32)
Calcium: 7.9 mg/dL — ABNORMAL LOW (ref 8.9–10.3)
Chloride: 110 mmol/L (ref 98–111)
Creatinine, Ser: 0.98 mg/dL (ref 0.44–1.00)
GFR calc Af Amer: >60 mL/min (ref >60)
GFR, EST NON AFRICAN AMERICAN: 58 mL/min — AB (ref >60)
GLUCOSE: 172 mg/dL — AB (ref 70–99)
POTASSIUM: 3.8 mmol/L (ref 3.5–5.1)
Sodium: 139 mmol/L (ref 135–145)

## 2018-01-01 LAB — MAGNESIUM: Magnesium: 1.8 mg/dL (ref 1.7–2.4)

## 2018-01-01 LAB — HIV ANTIBODY (ROUTINE TESTING W REFLEX): HIV Screen 4th Generation wRfx: NONREACTIVE

## 2018-01-01 MED ORDER — ONDANSETRON HCL 4 MG PO TABS: 4.0000 mg | ORAL_TABLET | Freq: Four times a day (QID) | ORAL | 0 refills | Status: DC | PRN

## 2018-01-01 MED ORDER — METHOCARBAMOL 500 MG PO TABS: 500.0000 mg | ORAL_TABLET | Freq: Three times a day (TID) | ORAL | 1 refills | Status: DC

## 2018-01-01 MED ORDER — SODIUM CHLORIDE 0.9% FLUSH: 10.0000 mL | INTRAVENOUS | Status: DC | PRN

## 2018-01-01 MED ORDER — POLYETHYLENE GLYCOL 3350 17 G PO PACK: 17.0000 g | PACK | Freq: Every day | ORAL | 0 refills | Status: DC | PRN

## 2018-01-01 MED ORDER — OXYCODONE HCL 5 MG PO TABS: 5.0000 mg | ORAL_TABLET | ORAL | 0 refills | Status: DC | PRN

## 2018-01-01 MED ORDER — LIDOCAINE-EPINEPHRINE (PF) 2 %-1:200000 IJ SOLN
INTRAMUSCULAR | Status: AC
  Filled 2018-01-01: qty 20

## 2018-01-01 MED ORDER — LIDOCAINE-EPINEPHRINE (PF) 2 %-1:200000 IJ SOLN
INTRAMUSCULAR | Status: AC | PRN
  Administered 2018-01-01: 5 mL

## 2018-01-01 NOTE — Discharge Summary (Signed)
[name] [name], is a 67 y.o. female  DOB [birth year]  MRN [id number].  Admission date:  12/30/2017  Admitting Physician  Toy Baker, MD  Discharge Date:  01/01/2018   Primary MD  [name], MD  Recommendations for primary care physician for things to follow:   1)Follow-up post hip surgery instructions given to you by your orthopedic surgeon Dr. Lyla Glassing 2)You are taking the blood thinner Xarelto so Avoid ibuprofen/Advil/Aleve/Motrin/Goody Powders/Naproxen/BC powders/Meloxicam/Diclofenac/Indomethacin and other Nonsteroidal anti-inflammatory medications as these will make you more likely to bleed and can cause stomach ulcers, can also cause Kidney problems.  3)Follow-up with your primary care doctor within 1 week for repeat CBC/complete blood count 4)Follow-up with Dr. 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 Abnormal LFTs 2013  . Allergic rhinosinusitis   . Anemia of chronic disease   . At risk for dental problems   . Atypical nevus   . Bacteremia due to Klebsiella pneumoniae 12/12/2011  . Bacterial overgrowth syndrome   . 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 Carotid stenosis    Carotid US (9/15):  R 40-59%; L 1-39% >> FU 1 year  . Congestive heart failure (CHF) (HCC)    EF  25-30%  . 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 Fracture of left clavicle   . History of blood transfusion 2013   anemia  . Hx of cardiovascular stress test    Myoview (9/15):  inf-apical scar; no ischemia; EF 47% - low risk   . 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 Osteoporosis   . Pancytopenia 10/07/2011  . Pathologic fracture of neck of femur ([place])   . Personal history of colonic polyps   . Renal insufficiency    hx of yrs ago  . Serratia marcescens infection - bactermia assoc w/ PICC 01/18/2015  . 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 Splenomegaly    By ultrasound  . Thrombophilia ([place])   . Vitamin D deficiency     Past Surgical History:  Procedure Laterality Date  . APPENDECTOMY  yrs ago  . CHOLECYSTECTOMY  yrs ago  . COLONOSCOPY  12/05/2005   internal hemorrhoids (for polyp surveillance)  . COLONOSCOPY  04/26/2012   Procedure: COLONOSCOPY;  Surgeon: Gatha Mayer, MD;  Location: [facility];  Service: Endoscopy;  Laterality: N/A;  . COLONOSCOPY WITH PROPOFOL N/A 03/18/2016   Procedure: COLONOSCOPY WITH PROPOFOL;  Surgeon: Gatha Mayer, MD;  Location: [facility];  Service: Endoscopy;  Laterality: N/A;  . ESOPHAGOGASTRODUODENOSCOPY  01/22/2009   erosive esophagitis  . HARDWARE REMOVAL Left 12/31/2017   Procedure: HARDWARE REMOVAL;  Surgeon: Rod Can, MD;  Location: [facility];  Service: Orthopedics;  Laterality: Left;  . INTRAMEDULLARY (IM) NAIL INTERTROCHANTERIC Left 12/31/2017   Procedure: INTRAMEDULLARY (IM) NAIL SUBTROCHANTRIC;  Surgeon: Rod Can, MD;  Location: [facility];  Service: Orthopedics;  Laterality: Left;  . IR FLUORO GUIDE CV LINE LEFT  01/01/2018  . IR GENERIC HISTORICAL  07/04/2016   IR REMOVAL TUN CV CATH W/O FL 07/04/2016 Ascencion Dike, PA-C [facility]  . IR GENERIC HISTORICAL  07/10/2016   IR US GUIDE VASC ACCESS LEFT 07/10/2016 Arne Cleveland, MD [facility]  . IR GENERIC  HISTORICAL  07/10/2016   IR FLUORO GUIDE CV LINE LEFT 07/10/2016 Arne Cleveland, MD [facility]  . IR PATIENT EVAL TECH 0-60 MINS  10/07/2017  . IR RADIOLOGIST EVAL & MGMT  07/21/2017  . ORIF PROXIMAL FEMORAL FRACTURE W/ ITST NAIL SYSTEM  03/2007   left, Dr. Shellia Carwin  . SMALL INTESTINE SURGERY  2005   multiple with right colon resection for ischemia/infarct       HPI  from the history and physical done on the day of admission:    Su C Schafer is a 67 y.o. 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 Shellia Carwin    Presented with mechanical fall regarding left hip pain. She went to [employer] and tripped over her own her own feet. No Syncope, no chest pain, no shortness of breath.  Patient apparently tripped around 1 PM today injuring her left hip pain radiating down to left knee denies any head injury no chest pain or shortness of breath no abdominal pain no back pain history significant for prior hip surgery EMS was called. arelto 8:30 PM last night, At baseline active, able towalk a flight of stairs or a block. 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 Shellia Carwin   Plan:   1)Lt Hip Fx -  S/p  ORIF on 12/31/17, preop  clearance from cardiology appreciated, doing well postop,   discharge home with home health physical therapy  2)Acquired short bowel syndrome with malnutrition and failure to thrive-----gets TPN usually for 8 hours overnight from Mondays through Thursdays, interventional radiology exchanged Lt IJ  PICC line out on 01/01/18  3)Anemia of chronic disease---- stable at this time, , suspect significant nutritional components to patient's anemia , continue multivitamin with iron  4)Hypercoagulable state---  okay to restart Xarelto    Code Status : full    Disposition Plan  : home with HH   Consults  :  ortho   Discharge Condition: stable   Follow UP  Follow-up Information    Swinteck, Aaron Edelman, MD. Schedule an appointment as soon as possible for a visit in 2 weeks.   Specialty:  Orthopedic Surgery Why:  For wound re-check Contact information: [address] [place] [address] [phone]            Consults obtained - ortho  Diet and Activity recommendation:  As advised  Discharge Instructions    Discharge Instructions    Call MD for:  difficulty breathing, headache or visual disturbances   Complete by:  As directed    Call MD for:  persistant dizziness or light-headedness   Complete by:  As directed    Call MD  for:  persistant nausea and vomiting   Complete by:  As directed    Call MD for:  redness, tenderness, or signs of infection (pain, swelling, redness, odor or green/yellow discharge around incision site)   Complete by:  As directed    Call MD for:  severe uncontrolled pain   Complete by:  As directed    Call MD for:  temperature >100.4   Complete by:  As directed    Diet general   Complete by:  As directed    Discharge instructions   Complete by:  As directed    1)Follow-up post hip surgery instructions given to you by your orthopedic surgeon Dr. Lyla Glassing 2)You are taking the blood thinner Xarelto so Avoid ibuprofen/Advil/Aleve/Motrin/Goody Powders/Naproxen/BC powders/Meloxicam/Diclofenac/Indomethacin and other Nonsteroidal anti-inflammatory medications as these will make you more likely to bleed and can cause stomach ulcers, can also cause Kidney problems.  3) follow-up with your primary care doctor within 1 week for repeat CBC/complete blood count 4) follow-up with Dr. Lyla Glassing the orthopedic surgeon as you already advised you to 5) take all your other medications including your TPN as previously advised   Increase activity slowly   Complete by:  As directed    Activity limitations as advised by orthopedic surgeon and physical therapist        Discharge Medications     Allergies as of 01/01/2018      Reactions   Iohexol Itching, Other (See Comments)   Pt is able to use IVP dye if taken with Benadryl.     Penicillins Itching, Swelling, Other (See Comments)   Reaction:  Facial swelling Has patient had a PCN reaction causing immediate rash, facial/tongue/throat swelling, SOB or lightheadedness with hypotension: Yes Has patient had a PCN reaction causing severe rash involving mucus membranes or skin necrosis: No Has patient had a PCN reaction that required hospitalization No Has patient had a PCN reaction occurring within the last 10 years: No If all of the above answers are "NO",  then may proceed with Cephalosporin use.      Medication List    STOP taking these medications   aspirin EC 81 MG  tablet   doxycycline 100 MG tablet Commonly known as:  VIBRA-TABS     TAKE these medications   acetaminophen 500 MG tablet Commonly known as:  TYLENOL Take 1,000 mg by mouth daily as needed for mild pain.   ADULT TPN 1,800 mLs 4 (four) times a week. Pt receives home TPN from [facility]:  1800 mL bag, four nights weekly (Monday, Tuesday, Wednesday, Thursday for 8 hours (includes 1 hour taper up and down).   albuterol 108 (90 Base) MCG/ACT inhaler Commonly known as:  PROVENTIL HFA;VENTOLIN HFA Inhale 2 puffs into the lungs every 6 (six) hours as needed for wheezing or shortness of breath.   BENADRYL 25 mg capsule Generic drug:  diphenhydrAMINE Take 25-50 mg by mouth every 6 (six) hours as needed for itching or sleep.   diphenoxylate-atropine 2.5-0.025 MG tablet Commonly known as:  LOMOTIL Take 1 tablet by mouth 4 (four) times daily as needed for diarrhea or loose stools.   methocarbamol 500 MG tablet Commonly known as:  ROBAXIN Take 1 tablet (500 mg total) by mouth 3 (three) times daily.   multivitamin with minerals Tabs tablet Take 1 tablet by mouth daily.   omeprazole 40 MG capsule Commonly known as:  PRILOSEC TAKE 1 CAPSULE 2 (TWO) TIMES DAILY BY MOUTH. TAKE BEFORE BREAKFAST AND SUPPER What changed:  See the new instructions.   ondansetron 4 MG tablet Commonly known as:  ZOFRAN Take 1 tablet (4 mg total) by mouth every 6 (six) hours as needed for nausea.   ondansetron 8 MG disintegrating tablet Commonly known as:  ZOFRAN-ODT DISSOLVE 1 TABLET BY MOUTH EVERY 8 HOURS AS NEEDED FOR NAUSEA OR VOMITING 30 MIN BEFORE INFUSION What changed:  See the new instructions.   oxyCODONE 5 MG immediate release tablet Commonly known as:  Oxy IR/ROXICODONE Take 1 tablet (5 mg total) by mouth every 4 (four) hours as needed for moderate pain.   polyethylene glycol  packet Commonly known as:  MIRALAX / GLYCOLAX Take 17 g by mouth daily as needed for moderate constipation.   PRESCRIPTION MEDICATION 5 mLs See admin instructions. 50 units Heparin sodium flush   rivaroxaban 20 MG Tabs tablet Commonly known as:  XARELTO Take 1 tablet (20 mg total) by mouth daily with supper.   sodium chloride 0.9 % infusion Uses for TPN   Teduglutide (rDNA) 5 MG Kit Inject 3.3 Units into the skin daily.   vitamin E 400 UNIT capsule Take 400 Units by mouth daily.       Major procedures and Radiology Reports - PLEASE review detailed and final reports for all details, in brief -     Ct Hip Left Wo Contrast  Result Date: 12/30/2017 CLINICAL DATA:  Fall today, left hip pain. EXAM: CT OF THE LEFT HIP WITHOUT CONTRAST TECHNIQUE: Multidetector CT imaging of the left hip was performed according to the standard protocol. Multiplanar CT image reconstructions were also generated. COMPARISON:  12/30/2017 FINDINGS: Bones/Joint/Cartilage Three cannulated screws extend through the right femoral neck and into the femoral head. There is an acute nondisplaced intertrochanteric fracture most readily visible along the anterior cortex for example on images 29 through 34 of series 3. Difficult to exclude a component extending below the screw margin although the dominant morphology appears to be intertrochanteric rather than subtrochanteric. Lipohemarthrosis in the hip. Ligaments Suboptimally assessed by CT. Muscles and Tendons Suspected hematoma within and along the distal iloipsoas, vastus intermedius, and vastus medialis muscles proximally. Soft tissues 3.4 cm fluid density lesion of  the left ovary, probably benign. IMPRESSION: 1. Nondisplaced left intertrochanteric fracture potentially with a small sub trochanteric component, nondisplaced and thus difficult to visualize. One unusual feature is the presence of a hip lipohemarthrosis, unusual given that this should normally be an  extra-articular fracture and raises the question as to whether there is an occult intra-articular component such as a femoral neck component. There are 3 cannulated screws extending through the femoral neck which do not appear fractured. There is also some indistinct hematoma tracking within along the anterior proximal thigh musculature including the iloipsoas, vastus intermedius, and vastus lateralis. 2. 3.4 cm likely benign fluid density lesion of the left ovary. Based on current guidelines, follow pelvic sonography is recommended. However, I feel that this can be deferred until the patient's acute hip problems have been addressed. This recommendation follows ACR consensus guidelines: White Paper of the ACR Incidental Findings Committee II on Adnexal Findings. J Am Coll Radiol [phone]. Electronically Signed   By: Van Clines M.D.   On: 12/30/2017 16:13   Ir Fluoro Guide Cv Line Left  Result Date: 01/01/2018 INDICATION: SHORT GUT SYNDROME, OCCLUDED RETRACTED PICC LINE EXAM: FLUOROSCOPIC EXCHANGE AND REPOSITION OF THE SINGLE-LUMEN LEFT IJ PICC LINE MEDICATIONS: 1% LIDOCAINE LOCAL CONTRAST:  None FLUOROSCOPY TIME:  SEVENTY-EIGHT seconds (7.8 mGy) COMPLICATIONS: None immediate. TECHNIQUE: The procedure, risks, benefits, and alternatives were explained to the patient and informed written consent was obtained. A timeout was performed prior to the initiation of the procedure. Under sterile conditions and local anesthesia, the existing occluded and retracted PICC line was cut and removed over a stiff Glidewire. A new 23 cm single-lumen cuffed PICC line was advanced over the Glidewire with the tip position at the SVC RA junction. Blood aspirated easily followed by heparinized saline flushes. Catheter secured with a retention suture. A dressing was placed. The patient tolerated the procedure well without immediate post procedural complication. FINDINGS: After catheter placement, the tip lies within the  superior cavoatrial junction. The catheter aspirates and flushes normally and is ready for immediate use. IMPRESSION: Successful fluoroscopic replacement of the left IJ single-lumen tunneled cuffed PICC line. Tip SVC RA junction. The PICC line is ready for immediate use. Electronically Signed   By: Jerilynn Mages.  Shick M.D.   On: 01/01/2018 10:03   Chest Portable 1 View  Result Date: 12/30/2017 CLINICAL DATA:  Preoperative evaluation for upcoming hip surgery EXAM: PORTABLE CHEST 1 VIEW COMPARISON:  None. FINDINGS: Cardiac shadow is enlarged. Left jugular PICC line is noted with the catheter tip at the junction of the innominate veins. Lungs are clear bilaterally. No pneumothorax is noted. IMPRESSION: No acute abnormality noted. Electronically Signed   By: Inez Catalina M.D.   On: 12/30/2017 21:36   Dg C-arm 1-60 Min-no Report  Result Date: 12/31/2017 Fluoroscopy was utilized by the requesting physician.  No radiographic interpretation.   Dg Hip Unilat W Or Wo Pelvis 2-3 Views Left  Result Date: 12/30/2017 CLINICAL DATA:  Fall.  Hip pain EXAM: DG HIP (WITH OR WITHOUT PELVIS) 2-3V LEFT COMPARISON:  None. FINDINGS: 3 pins in the left femoral neck. Avulsion fracture of the greater tuberosity appears acute. Fracture may extend across proximal femur into the lesser trochanter. Left hip joint normal.  No pelvic fracture.  Right hip normal. IMPRESSION: 3 pins in the left femoral neck. Acute nondisplaced fracture of the greater trochanter which may extend across the femur. Recommend CT left hip to further evaluate fracture. Electronically Signed   By: Franchot Gallo M.D.  On: 12/30/2017 14:45   Dg Femur Port Min 2 Views Left  Result Date: 12/31/2017 CLINICAL DATA:  Status post IM nailing and screw removal EXAM: LEFT FEMUR PORTABLE 2 VIEWS COMPARISON:  12/31/2017, 12/30/2017 FINDINGS: interim removal of fixating screws from the proximal left femur with placement of intramedullary rod and distal fixating screw. Hardware  appears intact. Faintly visible intertrochanteric fracture without significant displacement. Surgery gas in the soft tissues consistent with recent surgery IMPRESSION: Removal of fixating screws from the proximal left femur. Placement of intramedullary rod and distal fixating screw. Faintly visualized nondisplaced intertrochanteric fracture. Electronically Signed   By: Donavan Foil M.D.   On: 12/31/2017 20:07    Micro Results    Recent Results (from the past 240 hour(s))  Surgical pcr screen     Status: None   Collection Time: 12/31/17 12:53 PM  Result Value Ref Range Status   MRSA, PCR NEGATIVE NEGATIVE Final   Staphylococcus aureus NEGATIVE NEGATIVE Final    Comment: (NOTE) The Xpert SA Assay (FDA approved for NASAL specimens in patients 77 years of age and older), is one component of a comprehensive surveillance program. It is not intended to diagnose infection nor to guide or monitor treatment. Performed at [facility], [place] [address] [place], [place] [address]        Today   Subjective    Idali Debski today has no new complaints, requesting to be discharged home          Patient has been seen and examined prior to discharge   Objective   Blood pressure 123/67, pulse 66, temperature 98.2 F (36.8 C), temperature source Oral, resp. rate 20, height '5\' 7"'$  (1.702 m), weight 63.5 kg, last menstrual period 02/26/2014, SpO2 100 %.   Intake/Output Summary (Last 24 hours) at 01/01/2018 1624 Last data filed at 01/01/2018 1121 Gross per 24 hour  Intake 1103 ml  Output 1950 ml  Net -847 ml    Exam Gen:- Awake Alert,  In no apparent distress  HEENT:- [place].AT, No sclera icterus Neck-Supple Neck,No JVD,.  Lungs-  CTAB , chest wall with PICC line/ central catheter CV- S1, S2 normal Abd-  +ve B.Sounds, Abd Soft, No tenderness,    Extremity/Skin:-Good pulses, left hip postop site without significant bleeding, Psych-affect is appropriate, oriented x3 Neuro-no  new focal deficits, no tremors   Data Review   CBC w Diff:  Lab Results  Component Value Date   WBC 8.0 01/01/2018   HGB 9.1 (L) 01/01/2018   HGB 9.9 (L) 05/02/2014   HCT 28.0 (L) 01/01/2018   HCT 30.8 (L) 05/02/2014   PLT 119 (L) 01/01/2018   PLT 102 (L) 05/02/2014   PLT 166 05/07/2010   LYMPHOPCT 35 12/31/2017   LYMPHOPCT 27.2 05/02/2014   MONOPCT 18 12/31/2017   MONOPCT 6.8 05/02/2014   EOSPCT 6 12/31/2017   EOSPCT 3.2 05/02/2014   BASOPCT 1 12/31/2017   BASOPCT 0.3 05/02/2014    CMP:  Lab Results  Component Value Date   NA 139 01/01/2018   NA 139 01/13/2014   K 3.8 01/01/2018   K 3.6 01/13/2014   CL 110 01/01/2018   CL 106 10/22/2012   CO2 23 01/01/2018   CO2 21 (L) 01/13/2014   BUN 19 01/01/2018   BUN 14.6 01/13/2014   CREATININE 0.98 01/01/2018   CREATININE 1.0 01/13/2014   PROT 6.9 12/31/2017   PROT 8.1 01/13/2014   ALBUMIN 3.1 (L) 12/31/2017   ALBUMIN 3.4 (L) 01/13/2014  BILITOT 0.7 12/31/2017   BILITOT 0.26 01/13/2014   ALKPHOS 64 12/31/2017   ALKPHOS 85 01/13/2014   AST 20 12/31/2017   AST 19 01/13/2014   ALT 19 12/31/2017   ALT 20 01/13/2014   Total Discharge time is about 33 minutes  Roxan Hockey M.D on 01/01/2018 at 4:24 PM   Go to [email or web address] - [email or web address] for contact info  [facility] - Office  [phone]

## 2018-01-01 NOTE — Evaluation (Signed)
Occupational Therapy Evaluation and Discharge Patient Details Name: [name] [name] MRN: [id number] DOB: [birth year] Today's Date: 01/01/2018    History of Present Illness 67 yo female admitted with closed L subtrochanteric femur fracture after falling at home. S/P L femur IM nail 8/22. hx of fall, previous IM nail ~10 yrs ago, DVT, bradycardia, hypotension, osteoporosis, cardiomyopathy, chronic TPN   Clinical Impression   This 67 yo female admitted and underwent above presents to acute OT with all education completed and will have husband to A prn. No further OT needs, we will sign off.    Follow Up Recommendations  No OT follow up;Supervision/Assistance - 24 hour    Equipment Recommendations  None recommended by OT       Precautions / Restrictions Precautions Precautions: Fall Restrictions Weight Bearing Restrictions: No LLE Weight Bearing: Weight bearing as tolerated      Mobility Bed Mobility     General bed mobility comments: Pt up in recliner upon arrival  Transfers Overall transfer level: Needs assistance Equipment used: Rolling walker (2 wheeled) Transfers: Sit to/from Stand Sit to Stand: Min guard         General transfer comment: increased time to transition from arms of recliner to hands on RW    Balance Overall balance assessment: Needs assistance Sitting-balance support: No upper extremity supported;Feet supported Sitting balance-Leahy Scale: Good     Standing balance support: During functional activity;Single extremity supported Standing balance-Leahy Scale: Poor                             ADL either performed or assessed with clinical judgement   ADL Overall ADL's : Needs assistance/impaired Eating/Feeding: Independent;Sitting   Grooming: Set up;Standing   Upper Body Bathing: Set up;Sitting   Lower Body Bathing: Minimal assistance Lower Body Bathing Details (indicate cue type and reason): min guard A sit<>stand Upper Body  Dressing : Set up;Sitting   Lower Body Dressing: Moderate assistance Lower Body Dressing Details (indicate cue type and reason): min guard A sit<>stand Toilet Transfer: Min guard;Ambulation;RW Toilet Transfer Details (indicate cue type and reason): recliner>bathroom door>recliner Toileting- Clothing Manipulation and Hygiene: Min guard;Sit to/from stand     Tub/Shower Transfer Details (indicate cue type and reason): We discussed that when she steps into tub to step in with good leg, but step out with operated leg   General ADL Comments: Pt can almost get to her feet to do her own LBB/D (untill then husband will A her). I gave her some exercises to do to work on stretching upper leg area so that she will once again be able to cross her legs as she was before this happened. Educated pt and husband on most effcient sequence of dressing     Vision Patient Visual Report: No change from baseline              Pertinent Vitals/Pain Pain Assessment: 0-10 Pain Score: 3  Pain Location: L LE Pain Descriptors / Indicators: Aching;Sore;Discomfort Pain Intervention(s): Monitored during session;Repositioned;Ice applied     Hand Dominance Right   Extremity/Trunk Assessment Upper Extremity Assessment Upper Extremity Assessment: Overall WFL for tasks assessed     Communication Communication Communication: No difficulties   Cognition Arousal/Alertness: Awake/alert Behavior During Therapy: WFL for tasks assessed/performed Overall Cognitive Status: Within Functional Limits for tasks assessed  Home Living Family/patient expects to be discharged to:: Private residence Living Arrangements: Spouse/significant other Available Help at Discharge: Family;Available 24 hours/day Type of Home: House Home Access: Stairs to enter [employer] of Steps: 2 Entrance Stairs-Rails: None       Bathroom Shower/Tub: Tub/shower  unit;Door   [employer] Toilet: Standard     Home Equipment: Environmental consultant - 2 wheels;Bedside commode          Prior Functioning/Environment Level of Independence: Independent                 OT Problem List: Decreased range of motion;Impaired balance (sitting and/or standing);Pain         OT Goals(Current goals can be found in the care plan section) Acute Rehab OT Goals Patient Stated Goal: to go home tomorrow  OT Frequency:                AM-PAC PT "6 Clicks" Daily Activity     Outcome Measure Help from another person eating meals?: None Help from another person taking care of personal grooming?: A Little Help from another person toileting, which includes using toliet, bedpan, or urinal?: A Little Help from another person bathing (including washing, rinsing, drying)?: A Little Help from another person to put on and taking off regular upper body clothing?: A Little Help from another person to put on and taking off regular lower body clothing?: A Lot 6 Click Score: 18   End of Session Equipment Utilized During Treatment: Rolling walker  Activity Tolerance: Patient tolerated treatment well Patient left: in chair;with call bell/phone within reach;with family/visitor present;with chair alarm set  OT Visit Diagnosis: Unsteadiness on feet (R26.81);Other abnormalities of gait and mobility (R26.89);Muscle weakness (generalized) (M62.81);Pain Pain - Right/Left: Left Pain - part of body: Leg                Time: 7078-6754 OT Time Calculation (min): 20 min Charges:  OT General Charges $OT Visit: 1 Visit OT Evaluation $OT Eval Moderate Complexity: [address] [place] [phone] 01/01/2018

## 2018-01-01 NOTE — Discharge Instructions (Addendum)
1)Follow-up post hip surgery instructions given to you by your orthopedic surgeon Dr. Lyla Glassing 2)You are taking the blood thinner Xarelto so Avoid ibuprofen/Advil/Aleve/Motrin/Goody Powders/Naproxen/BC powders/Meloxicam/Diclofenac/Indomethacin and other Nonsteroidal anti-inflammatory medications as these will make you more likely to bleed and can cause stomach ulcers, can also cause Kidney problems.  3) follow-up with your primary care doctor within 1 week for repeat CBC/complete blood count 4) follow-up with Dr. Lyla Glassing the orthopedic surgeon as you already advised you to 5) take all your other medications including your TPN as previously advised     Dr. Rod Can Adult Hip & Knee Specialist [facility] [address] [place], [place] [address] [phone]   POSTOPERATIVE DIRECTIONS    Hip Rehabilitation, Guidelines Following Surgery   WEIGHT BEARING Weight bearing as tolerated with assist device (walker, cane, etc) as directed, use it as long as suggested by your surgeon or therapist, typically at least 4-6 weeks.   HOME CARE INSTRUCTIONS  Remove items at home which could result in a fall. This includes throw rugs or furniture in walking pathways.  Continue medications as instructed at time of discharge.  You may have some home medications which will be placed on hold until you complete the course of blood thinner medication. Do not put on socks or shoes without following the instructions of your caregivers.   Sit on chairs with arms. Use the chair arms to help push yourself up when arising.  Arrange for the use of a toilet seat elevator so you are not sitting low.   Walk with walker as instructed.  You may resume a sexual relationship in one month or when given the OK by your caregiver.  Use walker as long as suggested by your caregivers.  Avoid periods of inactivity such as sitting longer than an hour when not asleep. This helps prevent blood  clots.  You may return to work once you are cleared by Engineer, production.  Do not drive a car for 6 weeks or until released by your surgeon.  Do not drive while taking narcotics.  Wear elastic stockings for two weeks following surgery during the day but you may remove then at night.  Make sure you keep all of your appointments after your operation with all of your doctors and caregivers. You should call the office at the above phone number and make an appointment for approximately two weeks after the date of your surgery. Please pick up a stool softener and laxative for home use as long as you are requiring pain medications.  ICE to the affected hip every three hours for 30 minutes at a time and then as needed for pain and swelling. Continue to use ice on the hip for pain and swelling from surgery. You may notice swelling that will progress down to the foot and ankle.  This is normal after surgery.  Elevate the leg when you are not up walking on it.   It is important for you to complete the blood thinner medication as prescribed by your doctor.  Continue to use the breathing machine which will help keep your temperature down.  It is common for your temperature to cycle up and down following surgery, especially at night when you are not up moving around and exerting yourself.  The breathing machine keeps your lungs expanded and your temperature down.  RANGE OF MOTION AND STRENGTHENING EXERCISES  These exercises are designed to help you keep full movement of your hip joint. Follow your caregiver's or  physical therapist's instructions. Perform all exercises about fifteen times, three times per day or as directed. Exercise both hips, even if you have had only one joint replacement. These exercises can be done on a training (exercise) mat, on the floor, on a table or on a bed. Use whatever works the best and is most comfortable for you. Use music or television while you are exercising so that the exercises are a  pleasant break in your day. This will make your life better with the exercises acting as a break in routine you can look forward to.  Lying on your back, slowly slide your foot toward your buttocks, raising your knee up off the floor. Then slowly slide your foot back down until your leg is straight again.  Lying on your back spread your legs as far apart as you can without causing discomfort.  Lying on your side, raise your upper leg and foot straight up from the floor as far as is comfortable. Slowly lower the leg and repeat.  Lying on your back, tighten up the muscle in the front of your thigh (quadriceps muscles). You can do this by keeping your leg straight and trying to raise your heel off the floor. This helps strengthen the largest muscle supporting your knee.  Lying on your back, tighten up the muscles of your buttocks both with the legs straight and with the knee bent at a comfortable angle while keeping your heel on the floor.   SKILLED REHAB INSTRUCTIONS: If the patient is transferred to a skilled rehab facility following release from the hospital, a list of the current medications will be sent to the facility for the patient to continue.  When discharged from the skilled rehab facility, please have the facility set up the patient's [place] prior to being released. Also, the skilled facility will be responsible for providing the patient with their medications at time of release from the facility to include their pain medication and their blood thinner medication. If the patient is still at the rehab facility at time of the two week follow up appointment, the skilled rehab facility will also need to assist the patient in arranging follow up appointment in our office and any transportation needs.  MAKE SURE YOU:  Understand these instructions.  Will watch your condition.  Will get help right away if you are not doing well or get worse.  Pick up stool softner and laxative  for home use following surgery while on pain medications. Do not remove your dressings. Your dressing is waterproof. You may take showers - no tub baths or soaking. Continue to use ice for pain and swelling after surgery. Do not use any lotions or creams on the incision until instructed by your surgeon.     1)Follow-up post hip surgery instructions given to you by your orthopedic surgeon Dr. Lyla Glassing 2)You are taking the blood thinner Xarelto so Avoid ibuprofen/Advil/Aleve/Motrin/Goody Powders/Naproxen/BC powders/Meloxicam/Diclofenac/Indomethacin and other Nonsteroidal anti-inflammatory medications as these will make you more likely to bleed and can cause stomach ulcers, can also cause Kidney problems.  3) follow-up with your primary care doctor within 1 week for repeat CBC/complete blood count 4) follow-up with Dr. Lyla Glassing the orthopedic surgeon as you already advised you to 5) take all your other medications including your TPN as previously advised

## 2018-01-01 NOTE — Evaluation (Signed)
Physical Therapy Evaluation Patient Details Name: [name] [name] MRN: [id number] DOB: April 04, 1951 Today's Date: 01/01/2018   History of Present Illness  67 yo female admitted with closed L subtrochanteric femur fracture after falling at home. S/P L femur IM nail 8/22. hx of fall, previous IM nail ~10 yrs ago, DVT, bradycardia, hypotension, osteoporosis, cardiomyopathy, chronic TPN  Clinical Impression  On eval, pt required Min assist for mobility. She walked ~40 feet with a RW. Moderate pain with activity. Pt is very motivated to go home and regain her independence. Will continue to follow and progress activity as tolerated.     Follow Up Recommendations Home health PT;Supervision/Assistance - 24 hour    Equipment Recommendations  None recommended by PT    Recommendations for Other Services OT consult     Precautions / Restrictions Precautions Precautions: Fall Restrictions Weight Bearing Restrictions: No LLE Weight Bearing: Weight bearing as tolerated      Mobility  Bed Mobility Overal bed mobility: Needs Assistance Bed Mobility: Supine to Sit     Supine to sit: Min assist;HOB elevated     General bed mobility comments: Assist for L LE. Increased time.   Transfers Overall transfer level: Needs assistance Equipment used: Rolling walker (2 wheeled) Transfers: Sit to/from Stand Sit to Stand: Min assist         General transfer comment: VCs safety, technique, hand placement. Assist to rise, stabilize, control descent.   Ambulation/Gait Ambulation/Gait assistance: Min assist Gait Distance (Feet): 40 Feet Assistive device: Rolling walker (2 wheeled) Gait Pattern/deviations: Step-to pattern;Antalgic     General Gait Details: VCs safety, technique, sequence. Assist to stabilize intermittently.   Stairs            Wheelchair Mobility    Modified Rankin (Stroke Patients Only)       Balance Overall balance assessment: Needs assistance         Standing  balance support: Bilateral upper extremity supported Standing balance-Leahy Scale: Poor                               Pertinent Vitals/Pain Pain Assessment: 0-10 Pain Score: 6  Pain Location: L LE Pain Descriptors / Indicators: Aching;Sore;Discomfort Pain Intervention(s): Monitored during session;Repositioned;Ice applied    Home Living Family/patient expects to be discharged to:: Private residence Living Arrangements: Spouse/significant other Available Help at Discharge: Family Type of Home: House Home Access: Stairs to enter Entrance Stairs-Rails: None Entrance Stairs-Number of Steps: 2   Home Equipment: Environmental consultant - 2 wheels      Prior Function Level of Independence: Independent               Hand Dominance        Extremity/Trunk Assessment   Upper Extremity Assessment Upper Extremity Assessment: Overall WFL for tasks assessed    Lower Extremity Assessment Lower Extremity Assessment: Generalized weakness(s/p L IM nail)    Cervical / Trunk Assessment Cervical / Trunk Assessment: Normal  Communication   Communication: No difficulties  Cognition Arousal/Alertness: Awake/alert Behavior During Therapy: WFL for tasks assessed/performed Overall Cognitive Status: Within Functional Limits for tasks assessed                                        General Comments      Exercises General Exercises - Lower Extremity Ankle Circles/Pumps: AROM;Both;10 reps;Seated Quad Sets: AROM;Both;10 reps;Seated  Heel Slides: AAROM;Left;Seated;10 reps   Assessment/Plan    PT Assessment Patient needs continued PT services  PT Problem List Decreased strength;Decreased range of motion;Decreased activity tolerance;Decreased mobility;Decreased balance;Decreased knowledge of use of DME;Pain       PT Treatment Interventions DME instruction;Gait training;Functional mobility training;Therapeutic activities;Balance training;Patient/family  education;Therapeutic exercise    PT Goals (Current goals can be found in the Care Plan section)  Acute Rehab PT Goals Patient Stated Goal: home. regain PLOF.  PT Goal Formulation: With patient/family Time For Goal Achievement: 01/15/18 Potential to Achieve Goals: Good    Frequency Min 5X/week   Barriers to discharge        Co-evaluation               AM-PAC PT "6 Clicks" Daily Activity  Outcome Measure Difficulty turning over in bed (including adjusting bedclothes, sheets and blankets)?: A Lot Difficulty moving from lying on back to sitting on the side of the bed? : Unable Difficulty sitting down on and standing up from a chair with arms (e.g., wheelchair, bedside commode, etc,.)?: Unable Help needed moving to and from a bed to chair (including a wheelchair)?: A Little Help needed walking in hospital room?: A Little Help needed climbing 3-5 steps with a railing? : A Lot 6 Click Score: 12    End of Session Equipment Utilized During Treatment: Gait belt Activity Tolerance: Patient tolerated treatment well Patient left: in chair;with call bell/phone within reach;with chair alarm set;with family/visitor present   PT Visit Diagnosis: Pain;Muscle weakness (generalized) (M62.81);Difficulty in walking, not elsewhere classified (R26.2) Pain - Right/Left: Left Pain - part of body: Leg    Time: 1133-1150 PT Time Calculation (min) (ACUTE ONLY): 17 min   Charges:   PT Evaluation $PT Eval Moderate Complexity: 1 Mod           Weston Anna, MPT Pager: [phone]

## 2018-01-01 NOTE — Progress Notes (Signed)
    Subjective:  Patient reports pain as mild to moderate.  Denies N/V/CP/SOB. No c/o. Wants to go home.  Objective:   VITALS:   Vitals:   12/31/17 1945 12/31/17 2000 12/31/17 2015 01/01/18 0526  BP: 96/74 113/72 119/61 123/67  Pulse: 60 (!) 56 (!) 51 66  Resp: 17 12 13 20   Temp:   98.5 F (36.9 C) 98.2 F (36.8 C)  TempSrc:    Oral  SpO2: 100% 96% 99% 100%  Weight:      Height:        NAD ABD soft Sensation intact distally Intact pulses distally Dorsiflexion/Plantar flexion intact Incision: dressing C/D/I Compartment soft   Lab Results  Component Value Date   WBC 8.0 01/01/2018   HGB 9.1 (L) 01/01/2018   HCT 28.0 (L) 01/01/2018   MCV 97.2 01/01/2018   PLT 119 (L) 01/01/2018   BMET    Component Value Date/Time   NA 139 01/01/2018 0431   NA 139 01/13/2014 0951   K 3.8 01/01/2018 0431   K 3.6 01/13/2014 0951   CL 110 01/01/2018 0431   CL 106 10/22/2012 1005   CO2 23 01/01/2018 0431   CO2 21 (L) 01/13/2014 0951   GLUCOSE 172 (H) 01/01/2018 0431   GLUCOSE 95 01/13/2014 0951   GLUCOSE 95 10/22/2012 1005   BUN 19 01/01/2018 0431   BUN 14.6 01/13/2014 0951   CREATININE 0.98 01/01/2018 0431   CREATININE 1.0 01/13/2014 0951   CALCIUM 7.9 (L) 01/01/2018 0431   CALCIUM 8.9 01/13/2014 0951   GFRNONAA 58 (L) 01/01/2018 0431   GFRAA >60 01/01/2018 0431     Assessment/Plan: 1 Day Post-Op   Principal Problem:   Closed left subtrochanteric femur fracture (HCC) Active Problems:   Anemia of chronic disease   Acquired short bowel syndrome   GERD (gastroesophageal reflux disease)   Chronic systolic heart failure (HCC)   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

## 2018-01-01 NOTE — Progress Notes (Signed)
Physical Therapy Treatment Patient Details Name: [name] [name] MRN: [id number] DOB: [birth year] Today's Date: 01/01/2018    History of Present Illness 67 yo female admitted with closed L subtrochanteric femur fracture after falling at home. S/P L femur IM nail 8/22. hx of fall, previous IM nail ~10 yrs ago, DVT, bradycardia, hypotension, osteoporosis, cardiomyopathy, chronic TPN    PT Comments    Progressing well with mobility. Minimal pain with activity on today. Will plan to practice stair negotiation on tomorrow.    Follow Up Recommendations  Home health PT;Supervision/Assistance - 24 hour     Equipment Recommendations  None recommended by PT    Recommendations for Other Services OT consult     Precautions / Restrictions Precautions Precautions: Fall Restrictions Weight Bearing Restrictions: No LLE Weight Bearing: Weight bearing as tolerated    Mobility  Bed Mobility Overal bed mobility: Needs Assistance Bed Mobility: Supine to Sit          General bed mobility comments: Pt up in recliner upon arrival  Transfers Overall transfer level: Needs assistance Equipment used: Rolling walker (2 wheeled) Transfers: Sit to/from Stand Sit to Stand: Min guard         General transfer comment: close guard for safety. VCs hand placement. Increased time.   Ambulation/Gait Ambulation/Gait assistance: Min guard Gait Distance (Feet): 125 Feet Assistive device: Rolling walker (2 wheeled) Gait Pattern/deviations: Step-to pattern;Step-through pattern;Decreased stride length     General Gait Details: close guard for safety.    Stairs             Wheelchair Mobility    Modified Rankin (Stroke Patients Only)       Balance Overall balance assessment: Needs assistance Sitting-balance support: No upper extremity supported;Feet supported Sitting balance-Leahy Scale: Good     Standing balance support: Bilateral upper extremity supported Standing balance-Leahy  Scale: Poor                              Cognition Arousal/Alertness: Awake/alert Behavior During Therapy: WFL for tasks assessed/performed Overall Cognitive Status: Within Functional Limits for tasks assessed                                        Exercises   General Comments        Pertinent Vitals/Pain Pain Assessment: 0-10 Pain Score: 3  Pain Location: L LE Pain Descriptors / Indicators: Aching;Sore;Discomfort Pain Intervention(s): Monitored during session;Repositioned;Ice applied    Home Living Family/patient expects to be discharged to:: Private residence Living Arrangements: Spouse/significant other Available Help at Discharge: Family;Available 24 hours/day Type of Home: House Home Access: Stairs to enter Entrance Stairs-Rails: None   Home Equipment: Environmental consultant - 2 wheels;Bedside commode      Prior Function Level of Independence: Independent          PT Goals (current goals can now be found in the care plan section) Acute Rehab PT Goals Patient Stated Goal: to go home tomorrow PT Goal Formulation: With patient/family Time For Goal Achievement: 01/15/18 Potential to Achieve Goals: Good Progress towards PT goals: Progressing toward goals    Frequency    Min 5X/week      PT Plan Current plan remains appropriate    Co-evaluation              AM-PAC PT "6 Clicks" Daily Activity  Outcome Measure  Difficulty turning over in bed (including adjusting bedclothes, sheets and blankets)?: A Lot Difficulty moving from lying on back to sitting on the side of the bed? : Unable Difficulty sitting down on and standing up from a chair with arms (e.g., wheelchair, bedside commode, etc,.)?: A Little Help needed moving to and from a bed to chair (including a wheelchair)?: A Little Help needed walking in hospital room?: A Little Help needed climbing 3-5 steps with a railing? : A Lot 6 Click Score: 14    End of Session Equipment  Utilized During Treatment: Gait belt Activity Tolerance: Patient tolerated treatment well Patient left: in chair;with call bell/phone within reach;with family/visitor present   PT Visit Diagnosis: Pain;Muscle weakness (generalized) (M62.81);Difficulty in walking, not elsewhere classified (R26.2) Pain - Right/Left: Left Pain - part of body: Leg     Time: 3244-0102 PT Time Calculation (min) (ACUTE ONLY): 8 min  Charges:  $Gait Training: 8-22 mins                       Weston Anna, MPT Pager: [phone]

## 2018-01-01 NOTE — Progress Notes (Signed)
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  Insulin Requirements: none  Current Nutrition:  chronic TPN. 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 CT concerning for periprosthetic left hip fracture, and patient transferred to [place] for management by orthopedics. Pharmacy asked to assist with TPN management while patient hospitalized.   Significant events:  8/22: Plan injection of existing tunneled line with possible exchange today. Ortho consult pending for evaluation of non-displaced intertrochanteric/subtrochanteric femur fracture.  8/23: exchanged tunneled PICC in IR Today:    Glucose - 172 on BMET   Electrolytes -phos elevated,  all others WNL including corrected Ca   Renal - SCr WNL  LFTs - WNL  TGs - 149   Prealbumin - 16 (8/5), 14.8 (8/22)  NUTRITIONAL GOALS                                                                                             Nutritional Goals per RD 8/22: Kcal:  1750-1950 kcal, Protein:  75-90 grams  Home TPN regimen provides: 1853 Kcal/day and 62g/day protein -patient infuses TPN four nights a week (Monday, Tuesday, Wednesday, Thursday)  - 1862mL over 8 hours with one hour taper up (136ml/hr) and one hour taper down (169ml/hr), 228ml/hr  x 6 hrs inbetween  Clinimix E 5/20 at goal 67ml/hr + 20% Lipids at 32ml/hr over 12hrs will provide 78 grams protein, 1853 kcal per day  PLAN  MD to decide if pt will receive TPN today vs going home vs Pt only receives TPN Monday thru Thursday at home   Start Clinimix E 5/15 at 43ml/hr.  20% fat emulsion at 43ml/hr over 12hr.  TNA to contain standard multivitamins and trace elements.  Reduce IVF to 63ml/hr.  BMet with mag and phos in AM if pt receives TPN  TNA lab panels on Mondays & Thursdays.  F/u daily - patient only receives TPN Mondays through Thursdays   [place] 01/01/2018, 2:07 PM Pager [phone]

## 2018-01-01 NOTE — Procedures (Signed)
Chronic TPN access  S/p exchg of the Lt IJ tunneled picc   Tip now svcra No comp Stable EBL 0 Full report in pacs

## 2018-01-01 NOTE — Procedures (Signed)
Short gut syndrome, chronic TPN  S/p EXCHG LT IJ SL TUNNELED PICC  No comp Stable EBL 0 Tip svcra Ready for use

## 2018-01-01 NOTE — Anesthesia Postprocedure Evaluation (Signed)
Anesthesia Post Note  Patient: [name] [name]  Procedure(s) Performed: INTRAMEDULLARY (IM) NAIL SUBTROCHANTRIC (Left Leg Upper) HARDWARE REMOVAL (Left Hip)     Patient location during evaluation: PACU Anesthesia Type: General Level of consciousness: awake and alert Pain management: pain level controlled Vital Signs Assessment: post-procedure vital signs reviewed and stable Respiratory status: spontaneous breathing, nonlabored ventilation, respiratory function stable and patient connected to nasal cannula oxygen Cardiovascular status: blood pressure returned to baseline and stable Postop Assessment: no apparent nausea or vomiting Anesthetic complications: no    Last Vitals:  Vitals:   12/31/17 2015 01/01/18 0526  BP: 119/61 123/67  Pulse: (!) 51 66  Resp: 13 20  Temp: 36.9 C 36.8 C  SpO2: 99% 100%    Last Pain:  Vitals:   01/01/18 0526  TempSrc: Oral  PainSc:                  Deacon Gadbois

## 2018-01-01 NOTE — Progress Notes (Signed)
Pt discharged to home, instructions given to patient and husband. Acknowledged understanding. Will cont to monitor. SRP, RN

## 2018-01-01 NOTE — Progress Notes (Signed)
PT Cancellation Note  Patient Details Name: [name] [name] MRN: [id number] DOB: Dec 08, 1950   Cancelled Treatment:    Reason Eval/Treat Not Completed: Patient at procedure or test/unavailable. Will check back as schedule allows.    Weston Anna, MPT Pager: [phone]

## 2018-01-04 ENCOUNTER — Other Ambulatory Visit: Payer: Self-pay | Admitting: *Deleted

## 2018-01-04 ENCOUNTER — Telehealth (HOSPITAL_COMMUNITY): Payer: Self-pay | Admitting: Cardiovascular Disease

## 2018-01-04 DIAGNOSIS — I779 Disorder of arteries and arterioles, unspecified: Secondary | ICD-10-CM

## 2018-01-04 DIAGNOSIS — I739 Peripheral vascular disease, unspecified: Principal | ICD-10-CM

## 2018-01-04 NOTE — Telephone Encounter (Signed)
Patient aware of carotid results.

## 2018-01-04 NOTE — Telephone Encounter (Signed)
Follow Up:     Returning your call from last week.

## 2018-01-04 NOTE — Progress Notes (Signed)
Gilmore Laroche with [facility] called asking for resumption orders. Discharge summary and face to face faxed to [phone].

## 2018-01-04 NOTE — Patient Outreach (Signed)
[facility]) Care Management  01/04/2018  [name] [name] [birth year] [id number]   EMMI-       RED ON EMMI ALERT Day # 1 Date: 01/03/18 1117 Red Alert Reason: scheduled follow up ? No    Outreach attempt # 1 successful at her home number  Patient is able to verify HIPAA [place] Management RN reviewed and addressed red alert with patient [name] [name] reports at the time of the EMMI call on 01/03/18 she had not made a follow up appointment because she had left out of the hospital late 01/01/18 evening and MD offices did not open until 01/04/18 am  She has since made a follow up with her primary MD, K Tabori on 01/14/18 at 93  and the surgeon on 01/15/18 [facility]) She denies denies concerns with taking medications as prescribed, affording medications, side effects of medications and questions about medications She denies need for assist with or assist with changes for advance directives  She denies need of services from [facility]/Telephonic RN CM, pharmacy or SW at this time   Advised patient that there will be further automated EMMI- post discharge calls to assess how the patient is doing following the recent hospitalization Advised the patient that another call may be received from a nurse if any of their responses were abnormal. Patient voiced understanding and was appreciative of f/u call.  Plan: [facility] RN CM will close case at this time as patient has been assessed and no needs identified.  [facility] RN Cm encouraged [name] [name] to call back if services are needed at at later time   [place] L. Lavina Hamman, RN, BSN, [place] Management Care Coordinator Direct Number [phone] Mobile number [phone]  Main [facility] number [phone] Fax number [phone]

## 2018-01-05 NOTE — Addendum Note (Signed)
Addended by: Aris Georgia, Marilyne Haseley L on: 01/05/2018 11:15 AM   Modules accepted: Orders

## 2018-01-05 NOTE — Telephone Encounter (Signed)
Per Dr. Johnsie Cancel, Plaque no stenosis f/u carotid duplex in 2 years.  Placed order for carotid duplex in 2 years.

## 2018-01-12 ENCOUNTER — Other Ambulatory Visit (INDEPENDENT_AMBULATORY_CARE_PROVIDER_SITE_OTHER): Payer: Medicare Other

## 2018-01-12 DIAGNOSIS — K6389 Other specified diseases of intestine: Secondary | ICD-10-CM | POA: Diagnosis not present

## 2018-01-12 DIAGNOSIS — K912 Postsurgical malabsorption, not elsewhere classified: Secondary | ICD-10-CM

## 2018-01-12 LAB — COMPREHENSIVE METABOLIC PANEL
ALT: 12 U/L (ref 0–35)
AST: 16 U/L (ref 0–37)
Albumin: 3.4 g/dL — ABNORMAL LOW (ref 3.5–5.2)
Alkaline Phosphatase: 98 U/L (ref 39–117)
BUN: 9 mg/dL (ref 6–23)
CHLORIDE: 108 meq/L (ref 96–112)
CO2: 20 meq/L (ref 19–32)
Calcium: 8.8 mg/dL (ref 8.4–10.5)
Creatinine, Ser: 0.95 mg/dL (ref 0.40–1.20)
GFR: 62.23 mL/min (ref >60.00)
GLUCOSE: 73 mg/dL (ref 70–99)
POTASSIUM: 3.6 meq/L (ref 3.5–5.1)
SODIUM: 138 meq/L (ref 135–145)
Total Bilirubin: 0.6 mg/dL (ref 0.2–1.2)
Total Protein: 7.6 g/dL (ref 6.0–8.3)

## 2018-01-12 LAB — CBC WITH DIFFERENTIAL/PLATELET
BASOS PCT: 0.9 % (ref 0.0–3.0)
Basophils Absolute: 0 10*3/uL (ref 0.0–0.1)
EOS ABS: 0.2 10*3/uL (ref 0.0–0.7)
EOS PCT: 4.2 % (ref 0.0–5.0)
HCT: 29.2 % — ABNORMAL LOW (ref 36.0–46.0)
Hemoglobin: 9.6 g/dL — ABNORMAL LOW (ref 12.0–15.0)
LYMPHS ABS: 1.3 10*3/uL (ref 0.7–4.0)
Lymphocytes Relative: 22.7 % (ref 12.0–46.0)
MCHC: 33 g/dL (ref 30.0–36.0)
MCV: 97.2 fl (ref 78.0–100.0)
Monocytes Absolute: 0.5 10*3/uL (ref 0.1–1.0)
Monocytes Relative: 9.5 % (ref 3.0–12.0)
NEUTROS PCT: 62.7 % (ref 43.0–77.0)
Neutro Abs: 3.6 10*3/uL (ref 1.4–7.7)
Platelets: 192 10*3/uL (ref 150.0–400.0)
RBC: 3 Mil/uL — ABNORMAL LOW (ref 3.87–5.11)
RDW: 14.7 % (ref 11.5–15.5)
WBC: 5.7 10*3/uL (ref 4.0–10.5)

## 2018-01-12 LAB — PHOSPHORUS: PHOSPHORUS: 3.9 mg/dL (ref 2.3–4.6)

## 2018-01-12 LAB — MAGNESIUM: MAGNESIUM: 1.8 mg/dL (ref 1.5–2.5)

## 2018-01-12 LAB — C-REACTIVE PROTEIN: CRP: 1 mg/dL (ref 0.5–20.0)

## 2018-01-13 LAB — PREALBUMIN: Prealbumin: 13 mg/dL — ABNORMAL LOW (ref 17–34)

## 2018-01-14 ENCOUNTER — Other Ambulatory Visit: Payer: Self-pay

## 2018-01-14 ENCOUNTER — Encounter: Payer: Self-pay | Admitting: Family Medicine

## 2018-01-14 ENCOUNTER — Encounter: Payer: Medicare Other | Admitting: Family Medicine

## 2018-01-14 NOTE — Progress Notes (Signed)
This encounter was created in error - please disregard.

## 2018-01-15 DIAGNOSIS — S7225XD Nondisplaced subtrochanteric fracture of left femur, subsequent encounter for closed fracture with routine healing: Secondary | ICD-10-CM | POA: Diagnosis not present

## 2018-02-04 ENCOUNTER — Other Ambulatory Visit: Payer: Self-pay

## 2018-02-04 DIAGNOSIS — K90829 Short bowel syndrome, unspecified: Secondary | ICD-10-CM

## 2018-02-04 DIAGNOSIS — K6389 Other specified diseases of intestine: Secondary | ICD-10-CM

## 2018-02-04 DIAGNOSIS — K912 Postsurgical malabsorption, not elsewhere classified: Secondary | ICD-10-CM

## 2018-02-04 NOTE — Progress Notes (Signed)
rec'd fax from Virl Son, [place], [place] from [place] [facility] [phone]) reqesting Dr. Carlean Purl to add labs for pt's Monday lab drawn.  Labs approved per Dr. Carlean Purl (approved request scanned to chart). Labs entered.  Pt instructed to be fasting and abstain from alcohol for 24 hours prior to draw by Virl Son.

## 2018-02-07 ENCOUNTER — Other Ambulatory Visit: Payer: Self-pay | Admitting: Family Medicine

## 2018-02-08 ENCOUNTER — Ambulatory Visit (INDEPENDENT_AMBULATORY_CARE_PROVIDER_SITE_OTHER): Payer: Medicare Other | Admitting: *Deleted

## 2018-02-08 ENCOUNTER — Other Ambulatory Visit (INDEPENDENT_AMBULATORY_CARE_PROVIDER_SITE_OTHER): Payer: Medicare Other

## 2018-02-08 ENCOUNTER — Other Ambulatory Visit: Payer: Self-pay | Admitting: Internal Medicine

## 2018-02-08 DIAGNOSIS — K6389 Other specified diseases of intestine: Secondary | ICD-10-CM

## 2018-02-08 DIAGNOSIS — Z23 Encounter for immunization: Secondary | ICD-10-CM | POA: Diagnosis not present

## 2018-02-08 DIAGNOSIS — E7140 Disorder of carnitine metabolism, unspecified: Secondary | ICD-10-CM | POA: Diagnosis not present

## 2018-02-08 DIAGNOSIS — K912 Postsurgical malabsorption, not elsewhere classified: Secondary | ICD-10-CM

## 2018-02-08 DIAGNOSIS — K90829 Short bowel syndrome, unspecified: Secondary | ICD-10-CM

## 2018-02-08 LAB — VITAMIN D 25 HYDROXY (VIT D DEFICIENCY, FRACTURES): VITD: 9.78 ng/mL — AB (ref 30.00–100.00)

## 2018-02-08 LAB — FERRITIN: FERRITIN: 185.3 ng/mL (ref 10.0–291.0)

## 2018-02-08 LAB — PROTIME-INR
INR: 1.8 ratio — ABNORMAL HIGH (ref 0.8–1.0)
Prothrombin Time: 20.3 s — ABNORMAL HIGH (ref 9.6–13.1)

## 2018-02-08 LAB — VITAMIN B12: Vitamin B-12: 398 pg/mL (ref 211–911)

## 2018-02-08 MED ORDER — RIVAROXABAN 20 MG PO TABS: ORAL_TABLET | ORAL | 2 refills | Status: DC

## 2018-02-08 NOTE — Addendum Note (Signed)
Addended by: Davis Gourd on: 02/08/2018 08:51 AM   Modules accepted: Orders

## 2018-02-12 DIAGNOSIS — S7225XD Nondisplaced subtrochanteric fracture of left femur, subsequent encounter for closed fracture with routine healing: Secondary | ICD-10-CM | POA: Diagnosis not present

## 2018-02-12 DIAGNOSIS — Z5189 Encounter for other specified aftercare: Secondary | ICD-10-CM | POA: Diagnosis not present

## 2018-02-12 LAB — CARNITINE, LC/MS/MS
CARNITINE, FREE: 37 umol/L (ref 19–48)
CARNITINE, TOTAL: 57 umol/L (ref 25–58)
Carnitine, Esters: 19 umol/L — ABNORMAL HIGH (ref 4–13)
ESTERIFIED/FREE RATIO: 0.52 — AB (ref 0.13–0.42)

## 2018-02-15 LAB — FATTY ACID PANEL, ESSENTIAL (C12-C22), SERUM
Arachidic, C20:0: 18.92 umol/L (ref 16.8–38.5)
Arachidonic, C20:4w6: 451.25 umol/L (ref 351.4–1057)
DHA, C22:6w3: 71.03 umol/L (ref 53.3–216.3)
DOCOSENOIC, C22 1: 6.68 umol/L (ref 5.73–11.92)
DPA, C22 5W3: 49.89 umol/L (ref 19.9–77.6)
DPA, C22:5w6: 9.27 umol/L (ref 9.2–32.1)
DTA, C22 4W6: 19.08 umol/L (ref 9.1–44.6)
EPA, C20 5W3: 40.09 umol/L (ref 12.4–123)
HEXADECENOIC, C16 1W9: 48.3 umol/L (ref 19.82–59.9)
LAURIC,C12:0: 4.64 umol/L (ref 4.3–36)
Linoleic, C18:2w6: 3462.42 umol/L (ref 2653.4–613)
Mead, C20:3w9: 5.15 umol/L — ABNORMAL LOW (ref 10.3–41.3)
Myristic, C14:0: 58.48 umol/L (ref 39.4–258.2)
NERVONIC, C24 1W9: 76.62 umol/L (ref 56.9–132.7)
OLEIC, C18 1W9: 1724.33 umol/L (ref 872.4–4182)
PALMITIC, C16 0: 2325.45 umol/L (ref 1671.1–460)
Palmitoleic, C16:1w7: 296.1 umol/L (ref 68.5–570.2)
Stearic, C18:0: 710.84 umol/L (ref 590.2–1377)
Total Fatty Acids: 9.96 umol/L (ref 7.93–18.34)
Total Monounsaturated: 2.37 umol/L (ref 1.45–4.95)
Total Polyunsaturated: 4.39 umol/L (ref 3.57–8.11)
Total Saturated: 3.2 umol/L (ref 2.5–6.4)
Total w3: 0.27 umol/L (ref 0.1–0.5)
Total w6: 4.1 umol/L (ref 3.3–7.1)
Triene Tetraene Ratio: 0.58 (ref 0.2–0.8)
VACCENIC, C18 1W7: 200.03 umol/L (ref 84.8–260.8)
a Linolenic, C18:3w3: 112.33 umol/L (ref 26.1–150.1)
g Linolenic, C18:3w6: 51.02 umol/L (ref 14.5–144.9)
h g-Linolenic, C20:3w6: 105.43 umol/L (ref 70–319.9)

## 2018-02-15 LAB — MANGANESE: MANGANESE, BLOOD: 0.7 ug/L (ref <1.2)

## 2018-02-15 LAB — VITAMIN A: Vitamin A (Retinoic Acid): 18 ug/dL — ABNORMAL LOW (ref 38–98)

## 2018-02-15 LAB — CERULOPLASMIN: CERULOPLASMIN: 31 mg/dL (ref 18–53)

## 2018-02-15 LAB — ZINC: Zinc: 68 ug/dL (ref 60–130)

## 2018-02-15 LAB — VITAMIN E
GAMMA-TOCOPHEROL (VIT E): 3.1 mg/L (ref <=4.3)
VITAMIN E (ALPHA TOCOPHEROL): 7.2 mg/L (ref 5.7–19.9)

## 2018-02-15 LAB — SELENIUM SERUM: SELENIUM, BLOOD: 67 ug/L (ref 63–160)

## 2018-02-22 ENCOUNTER — Other Ambulatory Visit (INDEPENDENT_AMBULATORY_CARE_PROVIDER_SITE_OTHER): Payer: Medicare Other

## 2018-02-22 DIAGNOSIS — K912 Postsurgical malabsorption, not elsewhere classified: Secondary | ICD-10-CM | POA: Diagnosis not present

## 2018-02-22 DIAGNOSIS — K6389 Other specified diseases of intestine: Secondary | ICD-10-CM | POA: Diagnosis not present

## 2018-02-22 LAB — CBC WITH DIFFERENTIAL/PLATELET
BASOS ABS: 0 10*3/uL (ref 0.0–0.1)
BASOS PCT: 0.8 % (ref 0.0–3.0)
EOS ABS: 0.3 10*3/uL (ref 0.0–0.7)
Eosinophils Relative: 6.3 % — ABNORMAL HIGH (ref 0.0–5.0)
HEMATOCRIT: 29.9 % — AB (ref 36.0–46.0)
Hemoglobin: 9.7 g/dL — ABNORMAL LOW (ref 12.0–15.0)
LYMPHS ABS: 1.8 10*3/uL (ref 0.7–4.0)
LYMPHS PCT: 38.7 % (ref 12.0–46.0)
MCHC: 32.6 g/dL (ref 30.0–36.0)
MCV: 95 fl (ref 78.0–100.0)
MONOS PCT: 10.3 % (ref 3.0–12.0)
Monocytes Absolute: 0.5 10*3/uL (ref 0.1–1.0)
NEUTROS PCT: 43.9 % (ref 43.0–77.0)
Neutro Abs: 2 10*3/uL (ref 1.4–7.7)
PLATELETS: 197 10*3/uL (ref 150.0–400.0)
RBC: 3.15 Mil/uL — ABNORMAL LOW (ref 3.87–5.11)
RDW: 14.5 % (ref 11.5–15.5)
WBC: 4.6 10*3/uL (ref 4.0–10.5)

## 2018-02-22 LAB — COMPREHENSIVE METABOLIC PANEL
ALT: 10 U/L (ref 0–35)
AST: 10 U/L (ref 0–37)
Albumin: 3.1 g/dL — ABNORMAL LOW (ref 3.5–5.2)
Alkaline Phosphatase: 82 U/L (ref 39–117)
BUN: 14 mg/dL (ref 6–23)
CALCIUM: 8.5 mg/dL (ref 8.4–10.5)
CO2: 24 meq/L (ref 19–32)
Chloride: 107 mEq/L (ref 96–112)
Creatinine, Ser: 0.88 mg/dL (ref 0.40–1.20)
GFR: 67.96 mL/min (ref >60.00)
GLUCOSE: 75 mg/dL (ref 70–99)
Potassium: 3.5 mEq/L (ref 3.5–5.1)
Sodium: 138 mEq/L (ref 135–145)
Total Bilirubin: 0.4 mg/dL (ref 0.2–1.2)
Total Protein: 7.5 g/dL (ref 6.0–8.3)

## 2018-02-22 LAB — MAGNESIUM: Magnesium: 1.6 mg/dL (ref 1.5–2.5)

## 2018-02-22 LAB — PHOSPHORUS: Phosphorus: 3.9 mg/dL (ref 2.3–4.6)

## 2018-02-22 LAB — C-REACTIVE PROTEIN: CRP: 1.2 mg/dL (ref 0.5–20.0)

## 2018-02-22 NOTE — Progress Notes (Signed)
Labs show low vit A and D  Plans per pharmacist and will look at vit D supplementation also

## 2018-02-23 LAB — PREALBUMIN: Prealbumin: 13 mg/dL — ABNORMAL LOW (ref 17–34)

## 2018-02-24 NOTE — Progress Notes (Signed)
Labs stable Plans per pharmacists

## 2018-03-01 ENCOUNTER — Telehealth: Payer: Self-pay | Admitting: Internal Medicine

## 2018-03-01 NOTE — Telephone Encounter (Signed)
PICC line is not flushing and she is due for TPN tonight.  I spoke with Juliann Pulse at [facility] and she wants the patient to come now for evaluation.  They will decide if she will require an exchange.

## 2018-03-01 NOTE — Telephone Encounter (Signed)
Patient states she is having problems with her pick line? And needs to speak with nurse Barbera Setters to discuss further.

## 2018-03-09 ENCOUNTER — Other Ambulatory Visit (HOSPITAL_COMMUNITY): Payer: Self-pay | Admitting: Radiology

## 2018-03-09 ENCOUNTER — Ambulatory Visit (HOSPITAL_COMMUNITY)
Admission: RE | Admit: 2018-03-09 | Discharge: 2018-03-09 | Disposition: A | Discharge status: Home / Self Care | Payer: Medicare Other | Source: Ambulatory Visit | Attending: Radiology | Admitting: Radiology

## 2018-03-09 DIAGNOSIS — K912 Postsurgical malabsorption, not elsewhere classified: Secondary | ICD-10-CM | POA: Insufficient documentation

## 2018-03-09 DIAGNOSIS — Z452 Encounter for adjustment and management of vascular access device: Secondary | ICD-10-CM | POA: Diagnosis not present

## 2018-03-09 HISTORY — PX: IR PATIENT EVAL TECH 0-60 MINS: IMG5564

## 2018-03-09 NOTE — Procedures (Addendum)
Patient came in with c/o tunnelled picc line not flushing again.   I was not able to aspirate but the patient mentioned that it has never aspirated since placement.  I was able to easily flush the catheter with multiple 3 cc and 10 cc syringes.   She does understand if she has continual problems we may have to exchange the catheter since I had flushed it last week as well.  At this time she is satisfied with the flushing.

## 2018-03-19 ENCOUNTER — Encounter (HOSPITAL_COMMUNITY): Payer: Self-pay | Admitting: *Deleted

## 2018-03-22 ENCOUNTER — Other Ambulatory Visit (HOSPITAL_COMMUNITY): Payer: Self-pay | Admitting: Diagnostic Radiology

## 2018-03-22 DIAGNOSIS — Z789 Other specified health status: Secondary | ICD-10-CM

## 2018-03-23 ENCOUNTER — Other Ambulatory Visit: Payer: Self-pay | Admitting: Internal Medicine

## 2018-03-23 ENCOUNTER — Other Ambulatory Visit (HOSPITAL_COMMUNITY): Payer: Self-pay | Admitting: Interventional Radiology

## 2018-03-23 ENCOUNTER — Encounter (HOSPITAL_COMMUNITY): Payer: Self-pay | Admitting: Interventional Radiology

## 2018-03-23 ENCOUNTER — Ambulatory Visit (HOSPITAL_COMMUNITY)
Admission: RE | Admit: 2018-03-23 | Discharge: 2018-03-23 | Disposition: A | Discharge status: Home / Self Care | Payer: Medicare Other | Source: Ambulatory Visit | Attending: Diagnostic Radiology | Admitting: Diagnostic Radiology

## 2018-03-23 DIAGNOSIS — T859XXA Unspecified complication of internal prosthetic device, implant and graft, initial encounter: Secondary | ICD-10-CM | POA: Insufficient documentation

## 2018-03-23 DIAGNOSIS — Y831 Surgical operation with implant of artificial internal device as the cause of abnormal reaction of the patient, or of later complication, without mention of misadventure at the time of the procedure: Secondary | ICD-10-CM | POA: Diagnosis not present

## 2018-03-23 DIAGNOSIS — K912 Postsurgical malabsorption, not elsewhere classified: Secondary | ICD-10-CM | POA: Insufficient documentation

## 2018-03-23 DIAGNOSIS — T82598A Other mechanical complication of other cardiac and vascular devices and implants, initial encounter: Secondary | ICD-10-CM | POA: Diagnosis not present

## 2018-03-23 DIAGNOSIS — Z789 Other specified health status: Secondary | ICD-10-CM

## 2018-03-23 HISTORY — PX: IR CV LINE INJECTION: IMG2294

## 2018-03-23 MED ORDER — IOPAMIDOL (ISOVUE-300) INJECTION 61%
INTRAVENOUS | Status: AC
  Administered 2018-03-23: 10 mL
  Filled 2018-03-23: qty 50

## 2018-03-23 MED ORDER — IOPAMIDOL (ISOVUE-300) INJECTION 61%
50.0000 mL | Freq: Once | INTRAVENOUS | Status: AC | PRN
  Administered 2018-03-23: 10 mL

## 2018-03-24 ENCOUNTER — Ambulatory Visit (HOSPITAL_COMMUNITY)
Admission: RE | Admit: 2018-03-24 | Discharge: 2018-03-24 | Disposition: A | Discharge status: Home / Self Care | Payer: Medicare Other | Source: Ambulatory Visit | Attending: Interventional Radiology | Admitting: Interventional Radiology

## 2018-03-24 ENCOUNTER — Other Ambulatory Visit (HOSPITAL_COMMUNITY): Payer: Self-pay | Admitting: Interventional Radiology

## 2018-03-24 ENCOUNTER — Encounter (HOSPITAL_COMMUNITY): Payer: Self-pay | Admitting: Interventional Radiology

## 2018-03-24 DIAGNOSIS — Z789 Other specified health status: Secondary | ICD-10-CM

## 2018-03-24 DIAGNOSIS — Y831 Surgical operation with implant of artificial internal device as the cause of abnormal reaction of the patient, or of later complication, without mention of misadventure at the time of the procedure: Secondary | ICD-10-CM | POA: Diagnosis not present

## 2018-03-24 DIAGNOSIS — Z452 Encounter for adjustment and management of vascular access device: Secondary | ICD-10-CM | POA: Diagnosis not present

## 2018-03-24 DIAGNOSIS — T859XXA Unspecified complication of internal prosthetic device, implant and graft, initial encounter: Secondary | ICD-10-CM | POA: Insufficient documentation

## 2018-03-24 HISTORY — PX: IR FLUORO GUIDE CV LINE LEFT: IMG2282

## 2018-03-24 MED ORDER — LIDOCAINE HCL 1 % IJ SOLN
INTRAMUSCULAR | Status: AC
  Filled 2018-03-24: qty 20

## 2018-03-24 NOTE — Procedures (Signed)
Interventional Radiology Procedure Note  Procedure: Exchange of left IJ tunneled cuffed CL.   Findings: Catheter has become withdrawn.  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   New 24cm SL cuffed catheter, into the upper right atrium .  Complications: None  Recommendations:  - Ok to use - Do not submerge - Routine line care   Signed,  Dulcy Fanny. Earleen Newport, DO

## 2018-03-26 DIAGNOSIS — Z5189 Encounter for other specified aftercare: Secondary | ICD-10-CM | POA: Diagnosis not present

## 2018-03-26 DIAGNOSIS — S7225XD Nondisplaced subtrochanteric fracture of left femur, subsequent encounter for closed fracture with routine healing: Secondary | ICD-10-CM | POA: Diagnosis not present

## 2018-04-05 ENCOUNTER — Other Ambulatory Visit (INDEPENDENT_AMBULATORY_CARE_PROVIDER_SITE_OTHER): Payer: Medicare Other

## 2018-04-05 DIAGNOSIS — K90829 Short bowel syndrome, unspecified: Secondary | ICD-10-CM

## 2018-04-05 DIAGNOSIS — K912 Postsurgical malabsorption, not elsewhere classified: Secondary | ICD-10-CM

## 2018-04-05 DIAGNOSIS — K6389 Other specified diseases of intestine: Secondary | ICD-10-CM

## 2018-04-05 LAB — COMPREHENSIVE METABOLIC PANEL
ALBUMIN: 3.4 g/dL — AB (ref 3.5–5.2)
ALK PHOS: 87 U/L (ref 39–117)
ALT: 17 U/L (ref 0–35)
AST: 17 U/L (ref 0–37)
BILIRUBIN TOTAL: 0.4 mg/dL (ref 0.2–1.2)
BUN: 14 mg/dL (ref 6–23)
CO2: 22 mEq/L (ref 19–32)
CREATININE: 0.97 mg/dL (ref 0.40–1.20)
Calcium: 8.6 mg/dL (ref 8.4–10.5)
Chloride: 108 mEq/L (ref 96–112)
GFR: 60.71 mL/min (ref >60.00)
GLUCOSE: 85 mg/dL (ref 70–99)
POTASSIUM: 3.7 meq/L (ref 3.5–5.1)
SODIUM: 138 meq/L (ref 135–145)
TOTAL PROTEIN: 7.7 g/dL (ref 6.0–8.3)

## 2018-04-05 LAB — CBC WITH DIFFERENTIAL/PLATELET
BASOS ABS: 0.1 10*3/uL (ref 0.0–0.1)
Basophils Relative: 1.1 % (ref 0.0–3.0)
EOS PCT: 7.6 % — AB (ref 0.0–5.0)
Eosinophils Absolute: 0.4 10*3/uL (ref 0.0–0.7)
HCT: 33.5 % — ABNORMAL LOW (ref 36.0–46.0)
HEMOGLOBIN: 10.9 g/dL — AB (ref 12.0–15.0)
Lymphocytes Relative: 45 % (ref 12.0–46.0)
Lymphs Abs: 2.3 10*3/uL (ref 0.7–4.0)
MCHC: 32.6 g/dL (ref 30.0–36.0)
MCV: 93 fl (ref 78.0–100.0)
MONO ABS: 0.6 10*3/uL (ref 0.1–1.0)
Monocytes Relative: 11.9 % (ref 3.0–12.0)
NEUTROS PCT: 34.4 % — AB (ref 43.0–77.0)
Neutro Abs: 1.8 10*3/uL (ref 1.4–7.7)
Platelets: 169 10*3/uL (ref 150.0–400.0)
RBC: 3.6 Mil/uL — AB (ref 3.87–5.11)
RDW: 16.8 % — ABNORMAL HIGH (ref 11.5–15.5)
WBC: 5.1 10*3/uL (ref 4.0–10.5)

## 2018-04-05 LAB — PHOSPHORUS: Phosphorus: 3.8 mg/dL (ref 2.3–4.6)

## 2018-04-05 LAB — MAGNESIUM: Magnesium: 1.8 mg/dL (ref 1.5–2.5)

## 2018-04-05 LAB — C-REACTIVE PROTEIN: CRP: 0.1 mg/dL — AB (ref 0.5–20.0)

## 2018-04-06 LAB — PREALBUMIN: PREALBUMIN: 17 mg/dL (ref 17–34)

## 2018-04-11 NOTE — Progress Notes (Signed)
Labs normal or stable mild abnormalities

## 2018-04-26 ENCOUNTER — Telehealth: Payer: Self-pay | Admitting: Internal Medicine

## 2018-04-26 NOTE — Telephone Encounter (Signed)
Mendel Ryder a Insurance risk surveyor for pt states medication [employer]tex is requiring a prior auth that needs to go through [place].

## 2018-04-26 NOTE — Telephone Encounter (Signed)
Mendel Ryder will send me the [place] form

## 2018-04-27 NOTE — Telephone Encounter (Signed)
Prior auth started and form faxed

## 2018-05-03 ENCOUNTER — Other Ambulatory Visit (INDEPENDENT_AMBULATORY_CARE_PROVIDER_SITE_OTHER): Payer: Medicare Other

## 2018-05-03 DIAGNOSIS — K6389 Other specified diseases of intestine: Secondary | ICD-10-CM | POA: Diagnosis not present

## 2018-05-03 DIAGNOSIS — K912 Postsurgical malabsorption, not elsewhere classified: Secondary | ICD-10-CM

## 2018-05-03 LAB — CBC WITH DIFFERENTIAL/PLATELET
BASOS PCT: 0.8 % (ref 0.0–3.0)
Basophils Absolute: 0.1 10*3/uL (ref 0.0–0.1)
EOS PCT: 7 % — AB (ref 0.0–5.0)
Eosinophils Absolute: 0.5 10*3/uL (ref 0.0–0.7)
HCT: 34 % — ABNORMAL LOW (ref 36.0–46.0)
Hemoglobin: 11.1 g/dL — ABNORMAL LOW (ref 12.0–15.0)
Lymphocytes Relative: 30.7 % (ref 12.0–46.0)
Lymphs Abs: 2.1 10*3/uL (ref 0.7–4.0)
MCHC: 32.8 g/dL (ref 30.0–36.0)
MCV: 93.1 fl (ref 78.0–100.0)
MONO ABS: 0.8 10*3/uL (ref 0.1–1.0)
Monocytes Relative: 11.6 % (ref 3.0–12.0)
NEUTROS PCT: 49.9 % (ref 43.0–77.0)
Neutro Abs: 3.3 10*3/uL (ref 1.4–7.7)
Platelets: 134 10*3/uL — ABNORMAL LOW (ref 150.0–400.0)
RBC: 3.65 Mil/uL — ABNORMAL LOW (ref 3.87–5.11)
RDW: 17 % — ABNORMAL HIGH (ref 11.5–15.5)
WBC: 6.7 10*3/uL (ref 4.0–10.5)

## 2018-05-03 LAB — COMPREHENSIVE METABOLIC PANEL
ALT: 18 U/L (ref 0–35)
AST: 18 U/L (ref 0–37)
Albumin: 3.3 g/dL — ABNORMAL LOW (ref 3.5–5.2)
Alkaline Phosphatase: 91 U/L (ref 39–117)
BUN: 14 mg/dL (ref 6–23)
CO2: 19 mEq/L (ref 19–32)
Calcium: 8.4 mg/dL (ref 8.4–10.5)
Chloride: 107 mEq/L (ref 96–112)
Creatinine, Ser: 0.93 mg/dL (ref 0.40–1.20)
GFR: 63.72 mL/min (ref >60.00)
GLUCOSE: 72 mg/dL (ref 70–99)
POTASSIUM: 3.4 meq/L — AB (ref 3.5–5.1)
SODIUM: 136 meq/L (ref 135–145)
TOTAL PROTEIN: 7.3 g/dL (ref 6.0–8.3)
Total Bilirubin: 0.6 mg/dL (ref 0.2–1.2)

## 2018-05-03 LAB — PHOSPHORUS: PHOSPHORUS: 4.1 mg/dL (ref 2.3–4.6)

## 2018-05-03 LAB — C-REACTIVE PROTEIN: CRP: 0.1 mg/dL — ABNORMAL LOW (ref 0.5–20.0)

## 2018-05-03 LAB — MAGNESIUM: Magnesium: 1.5 mg/dL (ref 1.5–2.5)

## 2018-05-04 LAB — PREALBUMIN: Prealbumin: 16 mg/dL — ABNORMAL LOW (ref 17–34)

## 2018-05-13 NOTE — Telephone Encounter (Signed)
Patient has a new rx plan [facility] RX [phone] Member ID [id number] Plan # [id number] RX bin [id number] RX PCN [id number]  rx Grp [id number]  [employer] case # [id number] approved 05/12/18 until further notice.  Patient notified

## 2018-05-17 ENCOUNTER — Other Ambulatory Visit (HOSPITAL_COMMUNITY): Payer: Self-pay | Admitting: Radiology

## 2018-05-17 DIAGNOSIS — R633 Feeding difficulties, unspecified: Secondary | ICD-10-CM

## 2018-05-21 ENCOUNTER — Encounter (HOSPITAL_COMMUNITY): Payer: Self-pay | Admitting: Interventional Radiology

## 2018-05-21 ENCOUNTER — Ambulatory Visit (HOSPITAL_COMMUNITY)
Admission: RE | Admit: 2018-05-21 | Discharge: 2018-05-21 | Disposition: A | Discharge status: Home / Self Care | Payer: Medicare Other | Source: Ambulatory Visit | Attending: Radiology | Admitting: Radiology

## 2018-05-21 ENCOUNTER — Other Ambulatory Visit (HOSPITAL_COMMUNITY): Payer: Self-pay | Admitting: Radiology

## 2018-05-21 DIAGNOSIS — T82598A Other mechanical complication of other cardiac and vascular devices and implants, initial encounter: Secondary | ICD-10-CM | POA: Diagnosis not present

## 2018-05-21 DIAGNOSIS — K912 Postsurgical malabsorption, not elsewhere classified: Secondary | ICD-10-CM | POA: Diagnosis not present

## 2018-05-21 DIAGNOSIS — R633 Feeding difficulties, unspecified: Secondary | ICD-10-CM

## 2018-05-21 DIAGNOSIS — Z452 Encounter for adjustment and management of vascular access device: Secondary | ICD-10-CM | POA: Insufficient documentation

## 2018-05-21 HISTORY — PX: IR FLUORO GUIDE CV LINE LEFT: IMG2282

## 2018-05-21 MED ORDER — IOPAMIDOL (ISOVUE-300) INJECTION 61%
50.0000 mL | Freq: Once | INTRAVENOUS | Status: AC | PRN
  Administered 2018-05-21: 10 mL via INTRAVENOUS

## 2018-05-21 MED ORDER — IOPAMIDOL (ISOVUE-300) INJECTION 61%
INTRAVENOUS | Status: AC
  Administered 2018-05-21: 10 mL via INTRAVENOUS
  Filled 2018-05-21: qty 50

## 2018-05-21 MED ORDER — LIDOCAINE HCL 1 % IJ SOLN
INTRAMUSCULAR | Status: AC
  Filled 2018-05-21: qty 20

## 2018-05-21 MED ORDER — HEPARIN SOD (PORK) LOCK FLUSH 100 UNIT/ML IV SOLN
INTRAVENOUS | Status: AC
  Filled 2018-05-21: qty 5

## 2018-05-21 MED ORDER — LIDOCAINE HCL 1 % IJ SOLN
INTRAMUSCULAR | Status: DC | PRN
  Administered 2018-05-21: 5 mL via INTRADERMAL

## 2018-05-21 NOTE — Progress Notes (Signed)
Patient ID: Trust C Grobe, female   DOB: 1950/07/29, 68 y.o.   MRN: [id number]  Chronic TPN access  S/p LT IJ TUNNELED PICC EXCHG  NO COMP Tip svcra Stable  Ready for use

## 2018-05-25 ENCOUNTER — Encounter: Payer: Self-pay | Admitting: Internal Medicine

## 2018-05-25 DIAGNOSIS — E7140 Disorder of carnitine metabolism, unspecified: Secondary | ICD-10-CM

## 2018-05-25 HISTORY — DX: Disorder of carnitine metabolism, unspecified: E71.40

## 2018-05-26 ENCOUNTER — Encounter: Payer: Self-pay | Admitting: Internal Medicine

## 2018-05-26 ENCOUNTER — Ambulatory Visit (INDEPENDENT_AMBULATORY_CARE_PROVIDER_SITE_OTHER): Payer: Medicare Other | Admitting: Internal Medicine

## 2018-05-26 DIAGNOSIS — K6389 Other specified diseases of intestine: Secondary | ICD-10-CM | POA: Diagnosis not present

## 2018-05-26 DIAGNOSIS — D6859 Other primary thrombophilia: Secondary | ICD-10-CM | POA: Diagnosis not present

## 2018-05-26 DIAGNOSIS — K912 Postsurgical malabsorption, not elsewhere classified: Secondary | ICD-10-CM | POA: Diagnosis not present

## 2018-05-26 NOTE — Progress Notes (Signed)
[name] [name] 67 y.o. 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  Unfortunately she had a cold or upper respiratory infection and her PICC line became dislodged or out of place and had to be replaced so she was off TPN for about 9 days lately.  She continues to eat well.  Weight is stable.  Continues to get monitoring through pharmacy that administers her TPN and has a dietitian that helps with that as well.  Last fall she tripped and fractured a hip and had to have surgery but has done well after that.  Her husband has retired and is busy in his workshop, making benches, and unfortunately he has had some deaths in his family and he is trying to help his nephews.    Wt Readings from Last 3 Encounters:  05/26/18 146 lb (66.2 kg)  01/14/18 145 lb 8 oz (66 kg)  12/30/17 139 lb 15.9 oz (63.5 kg)    Allergies  Allergen Reactions  . Penicillins Itching, Swelling and Other (See Comments)    Reaction:  Facial swelling Has patient had a PCN reaction causing immediate rash, facial/tongue/throat swelling, SOB or lightheadedness with hypotension: Yes Has patient had a PCN reaction causing severe rash involving mucus membranes or skin necrosis: No Has patient had a PCN reaction that required hospitalization No Has patient had a PCN reaction occurring within the last 10 years: No If all of the above answers are "NO", then may proceed with Cephalosporin use.   Current Meds  Medication Sig  . acetaminophen (TYLENOL) 500 MG tablet Take 1,000 mg by mouth daily as needed for mild pain.  .  ADULT TPN 1,800 mLs 4 (four) times a week. Pt receives home TPN from [facility]:  1800 mL bag, four nights weekly (Monday, Tuesday, Wednesday, Thursday for 8 hours (includes 1 hour taper up and down).  . diphenhydrAMINE (BENADRYL) 25 mg capsule Take 25-50 mg by mouth every 6 (six) hours as needed for itching or sleep.   . diphenoxylate-atropine (LOMOTIL) 2.5-0.025 MG tablet Take 1 tablet by mouth 4 (four) times daily as needed for diarrhea or loose stools.  . Multiple Vitamin (MULTIVITAMIN WITH MINERALS) TABS Take 1 tablet by mouth daily.  Marland Kitchen omeprazole (PRILOSEC) 40 MG capsule TAKE 1 CAPSULE 2 (TWO) TIMES DAILY BY MOUTH. TAKE BEFORE BREAKFAST AND SUPPER  . ondansetron (ZOFRAN-ODT) 8 MG disintegrating tablet DISSOLVE 1 TABLET BY MOUTH EVERY 8 HOURS AS NEEDED FOR NAUSEA OR VOMITING 30 MIN BEFORE INFUSION (Patient taking differently: Take 8 mg by mouth every 8 (eight) hours as needed for nausea or vomiting. )  . PRESCRIPTION MEDICATION 5 mLs See admin instructions. 50 units Heparin sodium flush  . rivaroxaban (XARELTO) 20 MG TABS tablet TAKE 1 TABLET (20 MG TOTAL) BY MOUTH DAILY WITH SUPPER.  . sodium chloride 0.9 % infusion Uses for TPN  . Teduglutide, rDNA, (GATTEX) 5 MG KIT Inject 3.3 Units into the skin daily.  . vitamin E 400 UNIT capsule Take 400 Units by mouth daily.    Past Medical History:  Diagnosis Date  . Abnormal LFTs 2013  . Allergic rhinosinusitis   .  Anemia of chronic disease   . At risk for dental problems   . Atypical nevus   . Bacteremia due to Klebsiella pneumoniae 12/12/2011  . Bacterial overgrowth syndrome   . 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 Carotid stenosis    Carotid US (9/15):  R 40-59%; L 1-39% >> FU 1 year  . Congestive heart failure (CHF) (HCC)    EF 25-30%  . 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 Fracture of left clavicle   . History of blood transfusion 2013   anemia  . Hx of  cardiovascular stress test    Myoview (9/15):  inf-apical scar; no ischemia; EF 47% - low risk   . 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 Osteoporosis   . Pancytopenia 10/07/2011  . Pathologic fracture of neck of femur ([place])   . Personal history of colonic polyps   . Renal insufficiency    hx of yrs ago  . Serratia marcescens infection - bactermia assoc w/ PICC 01/18/2015  . 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 Splenomegaly    By ultrasound  . Thrombophilia ([place])   . Vitamin D deficiency    Past Surgical History:  Procedure Laterality Date  . APPENDECTOMY  yrs ago  . CHOLECYSTECTOMY  yrs ago  . COLONOSCOPY  12/05/2005   internal hemorrhoids (for polyp surveillance)  . COLONOSCOPY  04/26/2012   Procedure: COLONOSCOPY;  Surgeon: Gatha Mayer, MD;  Location: [facility];  Service: Endoscopy;  Laterality: N/A;  . COLONOSCOPY WITH PROPOFOL N/A 03/18/2016   Procedure: COLONOSCOPY WITH PROPOFOL;  Surgeon: Gatha Mayer, MD;  Location: [facility];  Service: Endoscopy;  Laterality: N/A;  . ESOPHAGOGASTRODUODENOSCOPY  01/22/2009   erosive esophagitis  . HARDWARE REMOVAL Left 12/31/2017   Procedure: HARDWARE REMOVAL;  Surgeon: Rod Can, MD;  Location: [facility];  Service: Orthopedics;  Laterality: Left;  . INTRAMEDULLARY (IM) NAIL INTERTROCHANTERIC Left 12/31/2017   Procedure: INTRAMEDULLARY (IM) NAIL SUBTROCHANTRIC;  Surgeon: Rod Can, MD;  Location: [facility];  Service: Orthopedics;  Laterality: Left;  . IR CV LINE INJECTION  03/23/2018  . IR FLUORO GUIDE CV LINE LEFT  01/01/2018  . IR FLUORO GUIDE CV LINE LEFT  03/24/2018  . IR FLUORO GUIDE CV LINE LEFT  05/21/2018  . IR GENERIC HISTORICAL  07/04/2016   IR REMOVAL TUN CV CATH W/O FL 07/04/2016 Ascencion Dike, PA-C [facility]  . IR GENERIC HISTORICAL  07/10/2016   IR US GUIDE VASC ACCESS LEFT 07/10/2016 Arne Cleveland, MD [facility]  . IR GENERIC HISTORICAL  07/10/2016   IR FLUORO  GUIDE CV LINE LEFT 07/10/2016 Arne Cleveland, MD [facility]  . IR PATIENT EVAL TECH 0-60 MINS  10/07/2017  . IR PATIENT EVAL TECH 0-60 MINS  03/09/2018  . IR RADIOLOGIST EVAL & MGMT  07/21/2017  . ORIF PROXIMAL FEMORAL FRACTURE W/ ITST NAIL SYSTEM  03/2007   left, Dr. Shellia Carwin  . SMALL INTESTINE SURGERY  2005   multiple with right colon resection for ischemia/infarct   Social History   Social History Narrative   Married to [place], has 1 daughter and a granddaughter the patient's son died in childhood due to a motor vehicle wreck   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   No alcohol tobacco or drug use   04/07/2017   family history includes  AAA (abdominal aortic aneurysm) in her mother; Diabetes in her mother; Hypertension in her mother.   Review of Systems See HPI  Objective:   Physical Exam BP 118/68   Pulse 62   Ht '5\' 8"'$  (1.727 m)   Wt 146 lb (66.2 kg)   LMP 02/26/2014   BMI 22.20 kg/m  NAD Eyes anicteric Lungs cta Cor s1s2 no rmg abd soft, loose skin, nontender w/o HSM palpable - chrinic mild thickening SQ tissues RLQ

## 2018-05-26 NOTE — Assessment & Plan Note (Signed)
On Xarelto 

## 2018-05-26 NOTE — Patient Instructions (Signed)
Call us when you need refills and we want to see you again in 6 months.    I appreciate the opportunity to care for you. Silvano Rusk, MD, [facility]

## 2018-05-26 NOTE — Assessment & Plan Note (Signed)
Stable on TPN RTC 6 mos

## 2018-05-26 NOTE — Assessment & Plan Note (Signed)
stable °

## 2018-05-28 ENCOUNTER — Encounter (HOSPITAL_BASED_OUTPATIENT_CLINIC_OR_DEPARTMENT_OTHER): Payer: Self-pay | Admitting: Emergency Medicine

## 2018-05-28 ENCOUNTER — Emergency Department (HOSPITAL_BASED_OUTPATIENT_CLINIC_OR_DEPARTMENT_OTHER)
Admission: EM | Admit: 2018-05-28 | Discharge: 2018-05-28 | Disposition: A | Discharge status: Home / Self Care | Payer: Medicare Other | Attending: Emergency Medicine | Admitting: Emergency Medicine

## 2018-05-28 ENCOUNTER — Emergency Department (HOSPITAL_BASED_OUTPATIENT_CLINIC_OR_DEPARTMENT_OTHER): Payer: Medicare Other

## 2018-05-28 ENCOUNTER — Other Ambulatory Visit: Payer: Self-pay

## 2018-05-28 DIAGNOSIS — J209 Acute bronchitis, unspecified: Secondary | ICD-10-CM | POA: Diagnosis not present

## 2018-05-28 DIAGNOSIS — Z79899 Other long term (current) drug therapy: Secondary | ICD-10-CM | POA: Diagnosis not present

## 2018-05-28 DIAGNOSIS — R0602 Shortness of breath: Secondary | ICD-10-CM | POA: Diagnosis not present

## 2018-05-28 DIAGNOSIS — Z7901 Long term (current) use of anticoagulants: Secondary | ICD-10-CM | POA: Diagnosis not present

## 2018-05-28 DIAGNOSIS — I5022 Chronic systolic (congestive) heart failure: Secondary | ICD-10-CM | POA: Diagnosis not present

## 2018-05-28 DIAGNOSIS — Z87891 Personal history of nicotine dependence: Secondary | ICD-10-CM | POA: Diagnosis not present

## 2018-05-28 DIAGNOSIS — R05 Cough: Secondary | ICD-10-CM | POA: Diagnosis not present

## 2018-05-28 MED ORDER — BENZONATATE 100 MG PO CAPS: 100.0000 mg | ORAL_CAPSULE | Freq: Three times a day (TID) | ORAL | 0 refills | Status: DC

## 2018-05-28 MED ORDER — BENZONATATE 100 MG PO CAPS
100.0000 mg | ORAL_CAPSULE | Freq: Once | ORAL | Status: AC
  Administered 2018-05-28: 100 mg via ORAL
  Filled 2018-05-28: qty 1

## 2018-05-28 MED ORDER — AZITHROMYCIN 250 MG PO TABS: 250.0000 mg | ORAL_TABLET | Freq: Every day | ORAL | 0 refills | Status: DC

## 2018-05-28 MED ORDER — IPRATROPIUM-ALBUTEROL 0.5-2.5 (3) MG/3ML IN SOLN
RESPIRATORY_TRACT | Status: AC
  Administered 2018-05-28: 3 mL
  Filled 2018-05-28: qty 3

## 2018-05-28 MED ORDER — ALBUTEROL SULFATE HFA 108 (90 BASE) MCG/ACT IN AERS: 1.0000 | INHALATION_SPRAY | Freq: Four times a day (QID) | RESPIRATORY_TRACT | 0 refills | Status: DC | PRN

## 2018-05-28 NOTE — Discharge Instructions (Addendum)
You were seen in the emergency department for a cough that started last night.  Your chest x-ray did not show an obvious pneumonia but may be some bronchitis findings.  Your breathing improved with a nebulizer treatments over sending you home with a prescription for an albuterol inhaler and some antibiotics.  If your symptoms do not improve in the next day or so please contact your primary care doctor as you may need to start on some steroids.  Return if any worsening symptoms.

## 2018-05-28 NOTE — ED Triage Notes (Addendum)
SOB and cough  with clear mucus since last night. Husband has had same symptoms

## 2018-05-28 NOTE — ED Provider Notes (Signed)
[facility] EMERGENCY DEPARTMENT Provider Note   CSN: [id number] Arrival date & time: 05/28/18  0845     History   Chief Complaint Chief Complaint  Patient presents with  . Cough    HPI [name] [name] is a 68 y.o. female.  She is complaining of a cough that started last night.  It is productive of some white sputum.  She says she is coughing so much that her head is hurting and that is making her nauseous.  No fevers or chills.  No vomiting or diarrhea.  No urinary symptoms.  Her husband has similar symptoms and went to the doctor and is now on antibiotics.  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  The history is provided by the patient.  Cough  Cough characteristics:  Productive Sputum characteristics:  Clear Severity:  Moderate Onset quality:  Sudden Duration:  12 hours Timing:  Intermittent Progression:  Unchanged Chronicity:  New Smoker: no   Context: sick contacts   Relieved by:  None tried Worsened by:  Nothing Ineffective treatments:  None tried Associated symptoms: headaches, shortness of breath and wheezing   Associated symptoms: no chest pain, no chills, no ear pain, no fever, no myalgias, no rash, no rhinorrhea, no sinus congestion and no sore throat     Past Medical History:  Diagnosis Date  . Abnormal LFTs 2013  . Allergic rhinosinusitis   . Anemia of chronic disease   . At risk for dental problems   . Atypical nevus   . Bacteremia due to Klebsiella pneumoniae 12/12/2011  . Bacterial overgrowth syndrome   . 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 Carotid stenosis    Carotid US (9/15):  R 40-59%; L 1-39% >> FU 1 year  . Congestive heart failure (CHF) (HCC)    EF 25-30%  . 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 Fracture of left clavicle   . History of blood transfusion 2013   anemia  . Hx of cardiovascular stress test    Myoview (9/15):   inf-apical scar; no ischemia; EF 47% - low risk   . 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 Osteoporosis   . Pancytopenia 10/07/2011  . Pathologic fracture of neck of femur ([place])   . Personal history of colonic polyps   . Renal insufficiency    hx of yrs ago  . Serratia marcescens infection - bactermia assoc w/ PICC 01/18/2015  . 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 Splenomegaly    By ultrasound  . Thrombophilia ([place])   . Vitamin D deficiency     Patient Active Problem List   Diagnosis Date Noted  . 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 Closed left subtrochanteric femur fracture ([place]) 12/31/2017  . Hypotension 12/30/2017  . Bradycardia 12/30/2017  . 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 Degenerative joint disease 07/08/2016  . Mild malnutrition ([place]) 05/08/2016  . 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 Splenomegaly   . Hx of adenomatous colonic polyps 04/18/2012  . Chronic systolic heart failure ([place]) 12/25/2011  . Nutritional deficiency 06/04/2011  . Osteoporosis 03/14/2010  . Anemia of chronic disease 10/01/2009  . GERD (gastroesophageal reflux disease) 11/19/2008  . ALKALINE PHOSPHATASE, ELEVATED 10/12/2008  . VITAMIN D DEFICIENCY 01/19/2008  . TRANSAMINASES, SERUM,  ELEVATED 01/19/2008  . Bacterial overgrowth syndrome 11/19/2004  . Acquired short bowel syndrome 11/20/2003    Past Surgical History:  Procedure Laterality Date  . APPENDECTOMY  yrs ago  . CHOLECYSTECTOMY  yrs ago  . COLONOSCOPY  12/05/2005   internal hemorrhoids (for polyp surveillance)  . COLONOSCOPY  04/26/2012   Procedure: COLONOSCOPY;  Surgeon: Gatha Mayer, MD;  Location: [facility];  Service: Endoscopy;  Laterality: N/A;  . COLONOSCOPY WITH PROPOFOL N/A 03/18/2016   Procedure: COLONOSCOPY WITH PROPOFOL;  Surgeon: Gatha Mayer, MD;  Location: [facility] END[facility];  Service:  Endoscopy;  Laterality: N/A;  . ESOPHAGOGASTRODUODENOSCOPY  01/22/2009   erosive esophagitis  . HARDWARE REMOVAL Left 12/31/2017   Procedure: HARDWARE REMOVAL;  Surgeon: Rod Can, MD;  Location: [facility];  Service: Orthopedics;  Laterality: Left;  . INTRAMEDULLARY (IM) NAIL INTERTROCHANTERIC Left 12/31/2017   Procedure: INTRAMEDULLARY (IM) NAIL SUBTROCHANTRIC;  Surgeon: Rod Can, MD;  Location: [facility];  Service: Orthopedics;  Laterality: Left;  . IR CV LINE INJECTION  03/23/2018  . IR FLUORO GUIDE CV LINE LEFT  01/01/2018  . IR FLUORO GUIDE CV LINE LEFT  03/24/2018  . IR FLUORO GUIDE CV LINE LEFT  05/21/2018  . IR GENERIC HISTORICAL  07/04/2016   IR REMOVAL TUN CV CATH W/O FL 07/04/2016 Ascencion Dike, PA-C [facility]  . IR GENERIC HISTORICAL  07/10/2016   IR US GUIDE VASC ACCESS LEFT 07/10/2016 Arne Cleveland, MD [facility]  . IR GENERIC HISTORICAL  07/10/2016   IR FLUORO GUIDE CV LINE LEFT 07/10/2016 Arne Cleveland, MD [facility]  . IR PATIENT EVAL TECH 0-60 MINS  10/07/2017  . IR PATIENT EVAL TECH 0-60 MINS  03/09/2018  . IR RADIOLOGIST EVAL & MGMT  07/21/2017  . ORIF PROXIMAL FEMORAL FRACTURE W/ ITST NAIL SYSTEM  03/2007   left, Dr. Shellia Carwin  . SMALL INTESTINE SURGERY  2005   multiple with right colon resection for ischemia/infarct     OB History    Gravida  2   Para  2   Term      Preterm      AB      Living  1     SAB      TAB      Ectopic      Multiple      Live Births               Home Medications    Prior to Admission medications   Medication Sig Start Date End Date Taking? Authorizing Provider  acetaminophen (TYLENOL) 500 MG tablet Take 1,000 mg by mouth daily as needed for mild pain.    [provider]  ADULT TPN 1,800 mLs 4 (four) times a week. Pt receives home TPN from [facility]:  1800 mL bag, four nights weekly (Monday, Tuesday, Wednesday, Thursday for 8 hours (includes 1 hour taper up and down).    [provider]    diphenhydrAMINE (BENADRYL) 25 mg capsule Take 25-50 mg by mouth every 6 (six) hours as needed for itching or sleep.     [provider]  diphenoxylate-atropine (LOMOTIL) 2.5-0.025 MG tablet Take 1 tablet by mouth 4 (four) times daily as needed for diarrhea or loose stools. 03/24/16   Gatha Mayer, MD  Multiple Vitamin (MULTIVITAMIN WITH MINERALS) TABS Take 1 tablet by mouth daily.    [provider]  omeprazole (PRILOSEC) 40 MG capsule TAKE 1 CAPSULE 2 (TWO) TIMES DAILY BY MOUTH. TAKE BEFORE BREAKFAST  AND SUPPER 03/23/18   Gatha Mayer, MD  ondansetron (ZOFRAN-ODT) 8 MG disintegrating tablet DISSOLVE 1 TABLET BY MOUTH EVERY 8 HOURS AS NEEDED FOR NAUSEA OR VOMITING 30 MIN BEFORE INFUSION Patient taking differently: Take 8 mg by mouth every 8 (eight) hours as needed for nausea or vomiting.  06/03/17   Gatha Mayer, MD  PRESCRIPTION MEDICATION 5 mLs See admin instructions. 50 units Heparin sodium flush    [provider]  rivaroxaban (XARELTO) 20 MG TABS tablet TAKE 1 TABLET (20 MG TOTAL) BY MOUTH DAILY WITH SUPPER. 02/08/18   [name], MD  sodium chloride 0.9 % infusion Uses for TPN 10/27/17   [provider]  Teduglutide, rDNA, (GATTEX) 5 MG KIT Inject 3.3 Units into the skin daily. 09/11/14   Gatha Mayer, MD  vitamin E 400 UNIT capsule Take 400 Units by mouth daily.     [provider]    Family History Family History  Problem Relation Age of Onset  . Diabetes Mother   . Hypertension Mother   . AAA (abdominal aortic aneurysm) Mother   . Colon cancer Neg Hx   . Stomach cancer Neg Hx     Social History Social History   Tobacco Use  . Smoking status: Former Smoker    Types: Cigarettes    Last attempt to quit: 05/12/2004    Years since quitting: 14.0  . Smokeless tobacco: Never Used  Substance Use Topics  . Alcohol use: No  . Drug use: No     Allergies   Penicillins   Review of Systems Review of Systems   Constitutional: Negative for chills and fever.  HENT: Negative for ear pain, rhinorrhea and sore throat.   Eyes: Negative for visual disturbance.  Respiratory: Positive for cough, shortness of breath and wheezing.   Cardiovascular: Negative for chest pain.  Gastrointestinal: Positive for nausea. Negative for abdominal pain and vomiting.  Genitourinary: Negative for dysuria.  Musculoskeletal: Negative for myalgias.  Skin: Negative for rash.  Neurological: Positive for headaches.     Physical Exam Updated Vital Signs BP 138/73 (BP Location: Right Arm)   Pulse 81   Temp 97.9 F (36.6 C) (Oral)   Resp (!) 22   Ht '5\' 8"'$  (1.727 m)   Wt 66.7 kg   LMP 02/26/2014   SpO2 94%   BMI 22.35 kg/m   Physical Exam Vitals signs and nursing note reviewed.  Constitutional:      General: She is not in acute distress.    Appearance: She is well-developed.  HENT:     Head: Normocephalic and atraumatic.  Eyes:     Conjunctiva/sclera: Conjunctivae normal.  Neck:     Musculoskeletal: Neck supple.  Cardiovascular:     Rate and Rhythm: Normal rate and regular rhythm.     Heart sounds: No murmur.  Pulmonary:     Effort: Pulmonary effort is normal. No respiratory distress.     Breath sounds: Wheezing present.     Comments: She is a PICC line in her left upper chest with no surrounding erythema.  On lung exam on the left side there is a definite rubbing sound that transmits. Abdominal:     Palpations: Abdomen is soft.     Tenderness: There is no abdominal tenderness.  Musculoskeletal:        General: No tenderness or signs of injury.     Right lower leg: No edema.     Left lower leg: No edema.  Skin:  General: Skin is warm and dry.     Capillary Refill: Capillary refill takes less than 2 seconds.  Neurological:     General: No focal deficit present.     Mental Status: She is alert.      ED Treatments / Results  Labs (all labs ordered are listed, but only abnormal results are  displayed) Labs Reviewed - No data to display  EKG EKG Interpretation  Date/Time:  Friday May 28 2018 08:59:32 EST Ventricular Rate:  79 PR Interval:    QRS Duration: 149 QT Interval:  396 QTC Calculation: 454 R Axis:   56 Text Interpretation:  Sinus rhythm IVCD, consider atypical LBBB Baseline wander in lead(s) II III aVF similar pattern to prior 8/19 Confirmed by Aletta Edouard [phone]) on 05/28/2018 9:06:50 AM   Radiology Dg Chest 2 View  Result Date: 05/28/2018 CLINICAL DATA:  Cough. EXAM: CHEST - 2 VIEW COMPARISON:  12/30/2017 FINDINGS: Cardiac silhouette is normal in size. No mediastinal or hilar masses. There is no evidence of adenopathy. There are prominent bronchovascular markings. Lungs Are hyperexpanded but otherwise clear. No pleural effusion or pneumothorax. Left anterior chest wall central venous catheter tip projects in the lower superior vena cava. Skeletal structures are intact. IMPRESSION: No active cardiopulmonary disease. Electronically Signed   By: Lajean Manes M.D.   On: 05/28/2018 09:30    Procedures Procedures (including critical care time)  Medications Ordered in ED Medications  ipratropium-albuterol (DUONEB) 0.5-2.5 (3) MG/3ML nebulizer solution (3 mLs  Given 05/28/18 0905)  benzonatate (TESSALON) capsule 100 mg (100 mg Oral Given 05/28/18 0955)     Initial Impression / Assessment and Plan / ED Course  I have reviewed the triage vital signs and the nursing notes.  Pertinent labs & imaging results that were available during my care of the patient were reviewed by me and considered in my medical decision making (see chart for details).  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  No fevers and otherwise looks well.  Husband on antibiotics for upper respiratory infection.  She is afebrile here and  sats are 94%.  Respiratory just given her DuoNeb for wheezing and she is already feeling improved.  Will check chest x-ray.   [MB]  0935 She feels better after neb and her chest x-ray did not show any obvious infiltrates.  She is used an inhaler before so we will prescribe her albuterol inhaler and a Z-Pak.  She does not want to go on steroids as she is she says that exacerbate some of her other medical problems so I instructed her to follow-up with her doctor if she is not improving to have a discussion of steroids.   [MB]    Clinical Course User Index [MB] Hayden Rasmussen, MD    Final Clinical Impressions(s) / ED Diagnoses   Final diagnoses:  Acute bronchitis, unspecified organism    ED Discharge Orders         Ordered    albuterol (PROVENTIL HFA;VENTOLIN HFA) 108 (90 Base) MCG/ACT inhaler  Every 6 hours PRN     05/28/18 0937    azithromycin (ZITHROMAX) 250 MG tablet  Daily     05/28/18 0937    benzonatate (TESSALON) 100 MG capsule  Every 8 hours     05/28/18 0940  Hayden Rasmussen, MD 05/28/18 1052

## 2018-06-03 ENCOUNTER — Other Ambulatory Visit: Payer: Self-pay | Admitting: Internal Medicine

## 2018-06-14 ENCOUNTER — Other Ambulatory Visit (INDEPENDENT_AMBULATORY_CARE_PROVIDER_SITE_OTHER): Payer: Medicare Other

## 2018-06-14 DIAGNOSIS — K6389 Other specified diseases of intestine: Secondary | ICD-10-CM | POA: Diagnosis not present

## 2018-06-14 DIAGNOSIS — K912 Postsurgical malabsorption, not elsewhere classified: Secondary | ICD-10-CM

## 2018-06-14 LAB — CBC WITH DIFFERENTIAL/PLATELET
BASOS ABS: 0.1 10*3/uL (ref 0.0–0.1)
Basophils Relative: 1 % (ref 0.0–3.0)
Eosinophils Absolute: 0.5 10*3/uL (ref 0.0–0.7)
Eosinophils Relative: 8.1 % — ABNORMAL HIGH (ref 0.0–5.0)
HCT: 35.9 % — ABNORMAL LOW (ref 36.0–46.0)
Hemoglobin: 11.6 g/dL — ABNORMAL LOW (ref 12.0–15.0)
Lymphocytes Relative: 36.9 % (ref 12.0–46.0)
Lymphs Abs: 2.3 10*3/uL (ref 0.7–4.0)
MCHC: 32.2 g/dL (ref 30.0–36.0)
MCV: 94.3 fl (ref 78.0–100.0)
Monocytes Absolute: 0.8 10*3/uL (ref 0.1–1.0)
Monocytes Relative: 12.7 % — ABNORMAL HIGH (ref 3.0–12.0)
NEUTROS PCT: 41.3 % — AB (ref 43.0–77.0)
Neutro Abs: 2.6 10*3/uL (ref 1.4–7.7)
Platelets: 167 10*3/uL (ref 150.0–400.0)
RBC: 3.81 Mil/uL — ABNORMAL LOW (ref 3.87–5.11)
RDW: 15.8 % — ABNORMAL HIGH (ref 11.5–15.5)
WBC: 6.2 10*3/uL (ref 4.0–10.5)

## 2018-06-14 LAB — COMPREHENSIVE METABOLIC PANEL
ALBUMIN: 3.4 g/dL — AB (ref 3.5–5.2)
ALT: 21 U/L (ref 0–35)
AST: 18 U/L (ref 0–37)
Alkaline Phosphatase: 83 U/L (ref 39–117)
BUN: 15 mg/dL (ref 6–23)
CALCIUM: 8.7 mg/dL (ref 8.4–10.5)
CO2: 22 mEq/L (ref 19–32)
Chloride: 107 mEq/L (ref 96–112)
Creatinine, Ser: 1 mg/dL (ref 0.40–1.20)
GFR: 55.12 mL/min — ABNORMAL LOW (ref >60.00)
Glucose, Bld: 88 mg/dL (ref 70–99)
Potassium: 3.8 mEq/L (ref 3.5–5.1)
Sodium: 137 mEq/L (ref 135–145)
Total Bilirubin: 0.5 mg/dL (ref 0.2–1.2)
Total Protein: 7.4 g/dL (ref 6.0–8.3)

## 2018-06-14 LAB — MAGNESIUM: MAGNESIUM: 1.5 mg/dL (ref 1.5–2.5)

## 2018-06-14 LAB — C-REACTIVE PROTEIN

## 2018-06-14 LAB — PHOSPHORUS: Phosphorus: 4.2 mg/dL (ref 2.3–4.6)

## 2018-06-15 ENCOUNTER — Other Ambulatory Visit: Payer: Self-pay | Admitting: Family Medicine

## 2018-06-15 LAB — PREALBUMIN: Prealbumin: 18 mg/dL (ref 17–34)

## 2018-07-07 ENCOUNTER — Telehealth: Payer: Self-pay | Admitting: Internal Medicine

## 2018-07-07 NOTE — Telephone Encounter (Signed)
Annie Main from [facility] called and wanted to speak with pt nurse to advised that one of the ingredients are on back order and he wanting to discuss changing the ingredient. There is no extension he advised that is his direct number.

## 2018-07-08 NOTE — Telephone Encounter (Signed)
They received fax response and have what they need

## 2018-07-09 ENCOUNTER — Other Ambulatory Visit (HOSPITAL_COMMUNITY): Payer: Self-pay | Admitting: Interventional Radiology

## 2018-07-09 ENCOUNTER — Encounter (HOSPITAL_COMMUNITY): Payer: Self-pay | Admitting: Interventional Radiology

## 2018-07-09 ENCOUNTER — Ambulatory Visit (HOSPITAL_COMMUNITY)
Admission: RE | Admit: 2018-07-09 | Discharge: 2018-07-09 | Disposition: A | Discharge status: Home / Self Care | Payer: Medicare Other | Source: Ambulatory Visit | Attending: Interventional Radiology | Admitting: Interventional Radiology

## 2018-07-09 DIAGNOSIS — K912 Postsurgical malabsorption, not elsewhere classified: Secondary | ICD-10-CM

## 2018-07-09 DIAGNOSIS — T82598A Other mechanical complication of other cardiac and vascular devices and implants, initial encounter: Secondary | ICD-10-CM | POA: Diagnosis not present

## 2018-07-09 DIAGNOSIS — Z452 Encounter for adjustment and management of vascular access device: Secondary | ICD-10-CM | POA: Insufficient documentation

## 2018-07-09 HISTORY — PX: IR FLUORO GUIDE CV LINE LEFT: IMG2282

## 2018-07-09 MED ORDER — HEPARIN SOD (PORK) LOCK FLUSH 100 UNIT/ML IV SOLN
INTRAVENOUS | Status: DC | PRN
  Administered 2018-07-09: 500 [IU]

## 2018-07-09 MED ORDER — HEPARIN SOD (PORK) LOCK FLUSH 100 UNIT/ML IV SOLN
INTRAVENOUS | Status: AC
  Filled 2018-07-09: qty 5

## 2018-07-09 MED ORDER — LIDOCAINE HCL 1 % IJ SOLN
INTRAMUSCULAR | Status: AC
  Filled 2018-07-09: qty 20

## 2018-07-19 ENCOUNTER — Telehealth: Payer: Self-pay | Admitting: Internal Medicine

## 2018-07-19 ENCOUNTER — Other Ambulatory Visit (INDEPENDENT_AMBULATORY_CARE_PROVIDER_SITE_OTHER): Payer: Medicare Other

## 2018-07-19 DIAGNOSIS — K912 Postsurgical malabsorption, not elsewhere classified: Secondary | ICD-10-CM | POA: Diagnosis not present

## 2018-07-19 LAB — CBC WITH DIFFERENTIAL/PLATELET
BASOS ABS: 0.1 10*3/uL (ref 0.0–0.1)
Basophils Relative: 1.1 % (ref 0.0–3.0)
Eosinophils Absolute: 0.4 10*3/uL (ref 0.0–0.7)
Eosinophils Relative: 5.5 % — ABNORMAL HIGH (ref 0.0–5.0)
HCT: 36 % (ref 36.0–46.0)
Hemoglobin: 11.9 g/dL — ABNORMAL LOW (ref 12.0–15.0)
Lymphocytes Relative: 28.1 % (ref 12.0–46.0)
Lymphs Abs: 2 10*3/uL (ref 0.7–4.0)
MCHC: 33 g/dL (ref 30.0–36.0)
MCV: 95.3 fl (ref 78.0–100.0)
Monocytes Absolute: 0.9 10*3/uL (ref 0.1–1.0)
Monocytes Relative: 11.9 % (ref 3.0–12.0)
Neutro Abs: 3.9 10*3/uL (ref 1.4–7.7)
Neutrophils Relative %: 53.4 % (ref 43.0–77.0)
Platelets: 151 10*3/uL (ref 150.0–400.0)
RBC: 3.77 Mil/uL — ABNORMAL LOW (ref 3.87–5.11)
RDW: 16 % — ABNORMAL HIGH (ref 11.5–15.5)
WBC: 7.3 10*3/uL (ref 4.0–10.5)

## 2018-07-19 LAB — C-REACTIVE PROTEIN: CRP: <1 mg/dL (ref 0.5–20.0)

## 2018-07-19 LAB — PHOSPHORUS: Phosphorus: 3.8 mg/dL (ref 2.3–4.6)

## 2018-07-19 LAB — MAGNESIUM: Magnesium: 1.6 mg/dL (ref 1.5–2.5)

## 2018-07-19 NOTE — Telephone Encounter (Signed)
Pharmacist Remo Lipps called for a verbal order for pt.  Please CB today.

## 2018-07-19 NOTE — Telephone Encounter (Signed)
Spoke with Annie Main the pharmacist at [facility], phone # [phone] and he got the signed fax from Dr Carlean Purl for the order renewal for her hydration. Order sent to be scanned into Epic.

## 2018-07-20 LAB — PREALBUMIN: Prealbumin: 15 mg/dL — ABNORMAL LOW (ref 17–34)

## 2018-09-12 ENCOUNTER — Other Ambulatory Visit: Payer: Self-pay | Admitting: Family Medicine

## 2018-09-14 ENCOUNTER — Other Ambulatory Visit: Payer: Self-pay

## 2018-09-14 MED ORDER — OMEPRAZOLE 40 MG PO CPDR: DELAYED_RELEASE_CAPSULE | ORAL | 1 refills | Status: DC

## 2018-09-14 NOTE — Telephone Encounter (Signed)
Omeprazole refilled as pharmacy requested.  

## 2018-09-27 ENCOUNTER — Telehealth: Payer: Self-pay | Admitting: Internal Medicine

## 2018-09-27 NOTE — Telephone Encounter (Signed)
Pt would like to have blood work done, she states that she usually have blood work every month but because of restrictions she has not had it in two months and wants to know if orders were sent so she can come this week. pls call her.

## 2018-09-27 NOTE — Telephone Encounter (Signed)
I left a detailed message for the patient that orders are in and lab is ope 8-4.  She is asked to call back if she has questions.

## 2018-10-05 ENCOUNTER — Other Ambulatory Visit (INDEPENDENT_AMBULATORY_CARE_PROVIDER_SITE_OTHER): Payer: Medicare Other

## 2018-10-05 DIAGNOSIS — K912 Postsurgical malabsorption, not elsewhere classified: Secondary | ICD-10-CM

## 2018-10-05 DIAGNOSIS — K6389 Other specified diseases of intestine: Secondary | ICD-10-CM | POA: Diagnosis not present

## 2018-10-05 LAB — CBC WITH DIFFERENTIAL/PLATELET
Basophils Absolute: 0.1 10*3/uL (ref 0.0–0.1)
Basophils Relative: 1.1 % (ref 0.0–3.0)
Eosinophils Absolute: 0.3 10*3/uL (ref 0.0–0.7)
Eosinophils Relative: 4.2 % (ref 0.0–5.0)
HCT: 33.5 % — ABNORMAL LOW (ref 36.0–46.0)
Hemoglobin: 11 g/dL — ABNORMAL LOW (ref 12.0–15.0)
Lymphocytes Relative: 24.8 % (ref 12.0–46.0)
Lymphs Abs: 1.7 10*3/uL (ref 0.7–4.0)
MCHC: 32.9 g/dL (ref 30.0–36.0)
MCV: 97.3 fl (ref 78.0–100.0)
Monocytes Absolute: 1 10*3/uL (ref 0.1–1.0)
Monocytes Relative: 14 % — ABNORMAL HIGH (ref 3.0–12.0)
Neutro Abs: 4 10*3/uL (ref 1.4–7.7)
Neutrophils Relative %: 55.9 % (ref 43.0–77.0)
Platelets: 142 10*3/uL — ABNORMAL LOW (ref 150.0–400.0)
RBC: 3.44 Mil/uL — ABNORMAL LOW (ref 3.87–5.11)
RDW: 15 % (ref 11.5–15.5)
WBC: 7.1 10*3/uL (ref 4.0–10.5)

## 2018-10-05 LAB — COMPREHENSIVE METABOLIC PANEL
ALT: 18 U/L (ref 0–35)
AST: 22 U/L (ref 0–37)
Albumin: 3.3 g/dL — ABNORMAL LOW (ref 3.5–5.2)
Alkaline Phosphatase: 82 U/L (ref 39–117)
BUN: 19 mg/dL (ref 6–23)
CO2: 22 mEq/L (ref 19–32)
Calcium: 8.7 mg/dL (ref 8.4–10.5)
Chloride: 111 mEq/L (ref 96–112)
Creatinine, Ser: 0.9 mg/dL (ref 0.40–1.20)
GFR: 62.19 mL/min (ref >60.00)
Glucose, Bld: 83 mg/dL (ref 70–99)
Potassium: 4 mEq/L (ref 3.5–5.1)
Sodium: 140 mEq/L (ref 135–145)
Total Bilirubin: 0.3 mg/dL (ref 0.2–1.2)
Total Protein: 7 g/dL (ref 6.0–8.3)

## 2018-10-05 LAB — MAGNESIUM: Magnesium: 1.8 mg/dL (ref 1.5–2.5)

## 2018-10-05 LAB — PHOSPHORUS: Phosphorus: 3.4 mg/dL (ref 2.3–4.6)

## 2018-10-05 LAB — C-REACTIVE PROTEIN: CRP: <1 mg/dL (ref 0.5–20.0)

## 2018-10-06 LAB — PREALBUMIN: Prealbumin: 16 mg/dL — ABNORMAL LOW (ref 17–34)

## 2018-10-06 NOTE — Progress Notes (Signed)
Labs ok.

## 2018-11-01 ENCOUNTER — Other Ambulatory Visit: Payer: Self-pay | Admitting: Internal Medicine

## 2018-11-01 ENCOUNTER — Other Ambulatory Visit (INDEPENDENT_AMBULATORY_CARE_PROVIDER_SITE_OTHER): Payer: Medicare Other

## 2018-11-01 DIAGNOSIS — D6859 Other primary thrombophilia: Secondary | ICD-10-CM | POA: Diagnosis not present

## 2018-11-01 DIAGNOSIS — K6389 Other specified diseases of intestine: Secondary | ICD-10-CM

## 2018-11-01 DIAGNOSIS — K90829 Short bowel syndrome, unspecified: Secondary | ICD-10-CM

## 2018-11-01 DIAGNOSIS — K912 Postsurgical malabsorption, not elsewhere classified: Secondary | ICD-10-CM

## 2018-11-01 LAB — CBC WITH DIFFERENTIAL/PLATELET
Basophils Absolute: 0.1 10*3/uL (ref 0.0–0.1)
Basophils Relative: 1 % (ref 0.0–3.0)
Eosinophils Absolute: 0.3 10*3/uL (ref 0.0–0.7)
Eosinophils Relative: 4.2 % (ref 0.0–5.0)
HCT: 34.2 % — ABNORMAL LOW (ref 36.0–46.0)
Hemoglobin: 11.3 g/dL — ABNORMAL LOW (ref 12.0–15.0)
Lymphocytes Relative: 29.2 % (ref 12.0–46.0)
Lymphs Abs: 1.8 10*3/uL (ref 0.7–4.0)
MCHC: 33.1 g/dL (ref 30.0–36.0)
MCV: 97.5 fl (ref 78.0–100.0)
Monocytes Absolute: 0.8 10*3/uL (ref 0.1–1.0)
Monocytes Relative: 12.5 % — ABNORMAL HIGH (ref 3.0–12.0)
Neutro Abs: 3.3 10*3/uL (ref 1.4–7.7)
Neutrophils Relative %: 53.1 % (ref 43.0–77.0)
Platelets: 137 10*3/uL — ABNORMAL LOW (ref 150.0–400.0)
RBC: 3.51 Mil/uL — ABNORMAL LOW (ref 3.87–5.11)
RDW: 14.5 % (ref 11.5–15.5)
WBC: 6.3 10*3/uL (ref 4.0–10.5)

## 2018-11-01 LAB — COMPREHENSIVE METABOLIC PANEL
ALT: 18 U/L (ref 0–35)
AST: 18 U/L (ref 0–37)
Albumin: 3.5 g/dL (ref 3.5–5.2)
Alkaline Phosphatase: 86 U/L (ref 39–117)
BUN: 12 mg/dL (ref 6–23)
CO2: 24 mEq/L (ref 19–32)
Calcium: 8.5 mg/dL (ref 8.4–10.5)
Chloride: 107 mEq/L (ref 96–112)
Creatinine, Ser: 0.93 mg/dL (ref 0.40–1.20)
GFR: 59.86 mL/min — ABNORMAL LOW (ref >60.00)
Glucose, Bld: 82 mg/dL (ref 70–99)
Potassium: 3.8 mEq/L (ref 3.5–5.1)
Sodium: 138 mEq/L (ref 135–145)
Total Bilirubin: 0.6 mg/dL (ref 0.2–1.2)
Total Protein: 7.1 g/dL (ref 6.0–8.3)

## 2018-11-01 LAB — MAGNESIUM: Magnesium: 1.6 mg/dL (ref 1.5–2.5)

## 2018-11-01 LAB — PHOSPHORUS: Phosphorus: 3.7 mg/dL (ref 2.3–4.6)

## 2018-11-01 LAB — C-REACTIVE PROTEIN: CRP: <1 mg/dL (ref 0.5–20.0)

## 2018-11-02 LAB — PREALBUMIN: Prealbumin: 18 mg/dL (ref 17–34)

## 2018-11-02 NOTE — Progress Notes (Signed)
Labs normal or stable mild abnormalities

## 2018-12-06 ENCOUNTER — Other Ambulatory Visit (INDEPENDENT_AMBULATORY_CARE_PROVIDER_SITE_OTHER): Payer: Medicare Other

## 2018-12-06 ENCOUNTER — Other Ambulatory Visit: Payer: Self-pay | Admitting: Internal Medicine

## 2018-12-06 DIAGNOSIS — K912 Postsurgical malabsorption, not elsewhere classified: Secondary | ICD-10-CM | POA: Diagnosis not present

## 2018-12-06 DIAGNOSIS — K6389 Other specified diseases of intestine: Secondary | ICD-10-CM | POA: Diagnosis not present

## 2018-12-06 LAB — CBC WITH DIFFERENTIAL/PLATELET
Basophils Absolute: 0 10*3/uL (ref 0.0–0.1)
Basophils Relative: 0.8 % (ref 0.0–3.0)
Eosinophils Absolute: 0.4 10*3/uL (ref 0.0–0.7)
Eosinophils Relative: 6.5 % — ABNORMAL HIGH (ref 0.0–5.0)
HCT: 34.4 % — ABNORMAL LOW (ref 36.0–46.0)
Hemoglobin: 11.1 g/dL — ABNORMAL LOW (ref 12.0–15.0)
Lymphocytes Relative: 36.7 % (ref 12.0–46.0)
Lymphs Abs: 2.1 10*3/uL (ref 0.7–4.0)
MCHC: 32.3 g/dL (ref 30.0–36.0)
MCV: 98 fl (ref 78.0–100.0)
Monocytes Absolute: 0.6 10*3/uL (ref 0.1–1.0)
Monocytes Relative: 11 % (ref 3.0–12.0)
Neutro Abs: 2.5 10*3/uL (ref 1.4–7.7)
Neutrophils Relative %: 45 % (ref 43.0–77.0)
Platelets: 131 10*3/uL — ABNORMAL LOW (ref 150.0–400.0)
RBC: 3.51 Mil/uL — ABNORMAL LOW (ref 3.87–5.11)
RDW: 14.7 % (ref 11.5–15.5)
WBC: 5.6 10*3/uL (ref 4.0–10.5)

## 2018-12-06 LAB — COMPREHENSIVE METABOLIC PANEL
ALT: 13 U/L (ref 0–35)
AST: 19 U/L (ref 0–37)
Albumin: 3.4 g/dL — ABNORMAL LOW (ref 3.5–5.2)
Alkaline Phosphatase: 86 U/L (ref 39–117)
BUN: 14 mg/dL (ref 6–23)
CO2: 22 mEq/L (ref 19–32)
Calcium: 8.7 mg/dL (ref 8.4–10.5)
Chloride: 110 mEq/L (ref 96–112)
Creatinine, Ser: 1.05 mg/dL (ref 0.40–1.20)
GFR: 52.03 mL/min — ABNORMAL LOW (ref >60.00)
Glucose, Bld: 73 mg/dL (ref 70–99)
Potassium: 3.6 mEq/L (ref 3.5–5.1)
Sodium: 139 mEq/L (ref 135–145)
Total Bilirubin: 0.6 mg/dL (ref 0.2–1.2)
Total Protein: 7.4 g/dL (ref 6.0–8.3)

## 2018-12-06 LAB — PHOSPHORUS: Phosphorus: 3.8 mg/dL (ref 2.3–4.6)

## 2018-12-06 LAB — MAGNESIUM: Magnesium: 1.8 mg/dL (ref 1.5–2.5)

## 2018-12-06 LAB — C-REACTIVE PROTEIN: CRP: <1 mg/dL (ref 0.5–20.0)

## 2018-12-07 ENCOUNTER — Emergency Department (HOSPITAL_COMMUNITY)
Admission: EM | Admit: 2018-12-07 | Discharge: 2018-12-07 | Disposition: A | Discharge status: Home / Self Care | Payer: Medicare Other | Attending: Emergency Medicine | Admitting: Emergency Medicine

## 2018-12-07 ENCOUNTER — Telehealth: Payer: Self-pay | Admitting: Internal Medicine

## 2018-12-07 ENCOUNTER — Encounter (HOSPITAL_COMMUNITY): Payer: Self-pay | Admitting: Emergency Medicine

## 2018-12-07 DIAGNOSIS — Z79899 Other long term (current) drug therapy: Secondary | ICD-10-CM | POA: Insufficient documentation

## 2018-12-07 DIAGNOSIS — T80219A Unspecified infection due to central venous catheter, initial encounter: Secondary | ICD-10-CM

## 2018-12-07 DIAGNOSIS — Z87891 Personal history of nicotine dependence: Secondary | ICD-10-CM | POA: Diagnosis not present

## 2018-12-07 DIAGNOSIS — T829XXA Unspecified complication of cardiac and vascular prosthetic device, implant and graft, initial encounter: Secondary | ICD-10-CM

## 2018-12-07 DIAGNOSIS — I509 Heart failure, unspecified: Secondary | ICD-10-CM | POA: Insufficient documentation

## 2018-12-07 DIAGNOSIS — R6883 Chills (without fever): Secondary | ICD-10-CM

## 2018-12-07 DIAGNOSIS — T82898A Other specified complication of vascular prosthetic devices, implants and grafts, initial encounter: Secondary | ICD-10-CM | POA: Insufficient documentation

## 2018-12-07 DIAGNOSIS — Z86718 Personal history of other venous thrombosis and embolism: Secondary | ICD-10-CM | POA: Diagnosis not present

## 2018-12-07 DIAGNOSIS — Y712 Prosthetic and other implants, materials and accessory cardiovascular devices associated with adverse incidents: Secondary | ICD-10-CM | POA: Insufficient documentation

## 2018-12-07 DIAGNOSIS — Z880 Allergy status to penicillin: Secondary | ICD-10-CM | POA: Diagnosis not present

## 2018-12-07 LAB — PREALBUMIN: Prealbumin: 11 mg/dL — ABNORMAL LOW (ref 17–34)

## 2018-12-07 MED ORDER — CEFAZOLIN SODIUM-DEXTROSE 1-4 GM/50ML-% IV SOLN
1.0000 g | Freq: Once | INTRAVENOUS | Status: AC
  Administered 2018-12-07: 16:00:00 1 g via INTRAVENOUS
  Filled 2018-12-07: qty 50

## 2018-12-07 NOTE — Telephone Encounter (Signed)
Patient has IJ central line for home TPN due to short gut syndrome.  Patient has chills when she infuses her TPN.  Spoke with IR about catheter exchange.  Per Dr. Chana Bode (radiology) line should be pulled and the catheter tip cultured.  Patient should have IV antibiotics and after 48 hours line can be replaced.  Per Dr. Hilarie Fredrickson patient will require admission to accomplish this.  Patient is advised that she should go to [facility] ED for admission.  I spoke with [facility] ED charge nurse and she is aware the patient will be presenting to the ED.

## 2018-12-07 NOTE — ED Notes (Signed)
An After Visit Summary was printed and given to the patient. Discharge instructions given and no further questions at this time.  

## 2018-12-07 NOTE — ED Triage Notes (Signed)
Per pt, states she is here to have central line cultured-states she has had symptoms happened before-normally gets periferal line then sent home with antibiotics until cultures come back

## 2018-12-07 NOTE — Telephone Encounter (Signed)
Pt's daughter [name] called as per pt request--reported that pt will soon be discharged.  Daughter stated that pt has been prescribed antibiotics and would like to know what to expect.

## 2018-12-07 NOTE — Telephone Encounter (Signed)
Patient's daughter notified of the ED notes.  I read her the summary ED notes and that IV antibiotics were arranged by ED physician.  She will call back for any additional questions or concerns.

## 2018-12-07 NOTE — Telephone Encounter (Signed)
See other phone note for details. 

## 2018-12-07 NOTE — ED Provider Notes (Signed)
[facility] DEPT Provider Note   CSN: [id number] Arrival date & time: 12/07/18  1236     History   Chief Complaint Chief Complaint  Patient presents with  . possible infection    HPI [name] [name] is a 68 y.o. female.     HPI 68 year old female with a history of short gut syndrome who receives TPN and occasional IV hydration through a midline PICC line presents the emergency department with reports of chills with infusions over the past 48 hours.  Sent to the ER from the GI clinic for possible infection within the PICC line.  Patient denies fevers and chills at this time.  She is otherwise been feeling well.  Patient reports that she has extremely limited access points left and is very concerned about removal of her PICC line.  She would prefer to try to treat through this.     Past Medical History:  Diagnosis Date  . Abnormal LFTs 2013  . Allergic rhinosinusitis   . Anemia of chronic disease   . At risk for dental problems   . Atypical nevus   . Bacteremia due to Klebsiella pneumoniae 12/12/2011  . Bacterial overgrowth syndrome   . 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 Carotid stenosis    Carotid US (9/15):  R 40-59%; L 1-39% >> FU 1 year  . Congestive heart failure (CHF) (HCC)    EF 25-30%  . 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 Fracture of left clavicle   . History of blood transfusion 2013   anemia  . Hx of cardiovascular stress test    Myoview (9/15):  inf-apical scar; no ischemia; EF 47% - low risk   . 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 Osteoporosis   . Pancytopenia 10/07/2011  . Pathologic fracture of neck of femur ([place])   . Personal history of colonic polyps   . Renal insufficiency    hx of yrs ago  . Serratia marcescens infection - bactermia assoc w/ PICC 01/18/2015  . 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 Splenomegaly    By ultrasound  . Thrombophilia ([place])   . Vitamin D deficiency     Patient Active Problem List   Diagnosis Date Noted  . 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 Closed left subtrochanteric femur fracture ([place]) 12/31/2017  . Hypotension 12/30/2017  . Bradycardia 12/30/2017  . 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 Degenerative joint disease 07/08/2016  . Mild malnutrition ([place]) 05/08/2016  . 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 Splenomegaly   . Hx of adenomatous colonic polyps 04/18/2012  . Chronic systolic heart failure ([place]) 12/25/2011  . Nutritional deficiency 06/04/2011  . Osteoporosis 03/14/2010  . Anemia of chronic disease 10/01/2009  . GERD (gastroesophageal reflux disease) 11/19/2008  . ALKALINE PHOSPHATASE, ELEVATED 10/12/2008  . VITAMIN D DEFICIENCY 01/19/2008  . TRANSAMINASES, SERUM, ELEVATED 01/19/2008  . Bacterial overgrowth syndrome 11/19/2004  . Acquired short bowel syndrome 11/20/2003    Past Surgical History:  Procedure Laterality Date  . APPENDECTOMY  yrs ago  . CHOLECYSTECTOMY  yrs ago  . COLONOSCOPY  12/05/2005   internal hemorrhoids (for polyp surveillance)  . COLONOSCOPY  04/26/2012   Procedure: COLONOSCOPY;  Surgeon: Gatha Mayer, MD;  Location: [facility];  Service: Endoscopy;  Laterality: N/A;  .  COLONOSCOPY WITH PROPOFOL N/A 03/18/2016   Procedure: COLONOSCOPY WITH PROPOFOL;  Surgeon: Gatha Mayer, MD;  Location: [facility];  Service: Endoscopy;  Laterality: N/A;  . ESOPHAGOGASTRODUODENOSCOPY  01/22/2009   erosive esophagitis  . HARDWARE REMOVAL Left 12/31/2017   Procedure: HARDWARE REMOVAL;  Surgeon: Rod Can, MD;  Location: [facility];  Service: Orthopedics;  Laterality: Left;  . INTRAMEDULLARY (IM) NAIL INTERTROCHANTERIC Left 12/31/2017   Procedure: INTRAMEDULLARY (IM) NAIL SUBTROCHANTRIC;  Surgeon: Rod Can, MD;  Location: [facility];   Service: Orthopedics;  Laterality: Left;  . IR CV LINE INJECTION  03/23/2018  . IR FLUORO GUIDE CV LINE LEFT  01/01/2018  . IR FLUORO GUIDE CV LINE LEFT  03/24/2018  . IR FLUORO GUIDE CV LINE LEFT  05/21/2018  . IR FLUORO GUIDE CV LINE LEFT  07/09/2018  . IR GENERIC HISTORICAL  07/04/2016   IR REMOVAL TUN CV CATH W/O FL 07/04/2016 Ascencion Dike, PA-C [facility]  . IR GENERIC HISTORICAL  07/10/2016   IR US GUIDE VASC ACCESS LEFT 07/10/2016 Arne Cleveland, MD [facility]  . IR GENERIC HISTORICAL  07/10/2016   IR FLUORO GUIDE CV LINE LEFT 07/10/2016 Arne Cleveland, MD [facility]  . IR PATIENT EVAL TECH 0-60 MINS  10/07/2017  . IR PATIENT EVAL TECH 0-60 MINS  03/09/2018  . IR RADIOLOGIST EVAL & MGMT  07/21/2017  . ORIF PROXIMAL FEMORAL FRACTURE W/ ITST NAIL SYSTEM  03/2007   left, Dr. Shellia Carwin  . SMALL INTESTINE SURGERY  2005   multiple with right colon resection for ischemia/infarct     OB History    Gravida  2   Para  2   Term      Preterm      AB      Living  1     SAB      TAB      Ectopic      Multiple      Live Births               Home Medications    Prior to Admission medications   Medication Sig Start Date End Date Taking? Authorizing Provider  acetaminophen (TYLENOL) 500 MG tablet Take 1,000 mg by mouth daily as needed for mild pain.    [provider]  ADULT TPN 1,800 mLs 4 (four) times a week. Pt receives home TPN from [facility]:  1800 mL bag, four nights weekly (Monday, Tuesday, Wednesday, Thursday for 8 hours (includes 1 hour taper up and down).    [provider]  azithromycin (ZITHROMAX) 250 MG tablet Take 1 tablet (250 mg total) by mouth daily. Take first 2 tablets together, then 1 every day until finished. 05/28/18   Hayden Rasmussen, MD  benzonatate (TESSALON) 100 MG capsule Take 1 capsule (100 mg total) by mouth every 8 (eight) hours. 05/28/18   Hayden Rasmussen, MD  diphenhydrAMINE (BENADRYL) 25 mg capsule Take 25-50 mg by mouth  every 6 (six) hours as needed for itching or sleep.     [provider]  diphenoxylate-atropine (LOMOTIL) 2.5-0.025 MG tablet Take 1 tablet by mouth 4 (four) times daily as needed for diarrhea or loose stools. 03/24/16   Gatha Mayer, MD  Multiple Vitamin (MULTIVITAMIN WITH MINERALS) TABS Take 1 tablet by mouth daily.    [provider]  omeprazole (PRILOSEC) 40 MG capsule TAKE 1 CAPSULE 2 (TWO) TIMES DAILY BY MOUTH. TAKE BEFORE BREAKFAST AND SUPPER 09/14/18   Gatha Mayer, MD  ondansetron (ZOFRAN-ODT) 8 MG disintegrating tablet Take 1 tablet (8 mg total) by mouth every 8 (eight) hours as needed for nausea or vomiting. 06/03/18   Gatha Mayer, MD  PRESCRIPTION MEDICATION 5 mLs See admin instructions. 50 units Heparin sodium flush    [provider]  sodium chloride 0.9 % infusion Uses for TPN 10/27/17   [provider]  Teduglutide, rDNA, (GATTEX) 5 MG KIT Inject 3.3 Units into the skin daily. 09/11/14   Gatha Mayer, MD  VENTOLIN HFA 108 (90 Base) MCG/ACT inhaler TAKE 2 PUFFS BY MOUTH EVERY 6 HOURS AS NEEDED FOR WHEEZE OR SHORTNESS OF BREATH 06/15/18   [name], MD  vitamin E 400 UNIT capsule Take 400 Units by mouth daily.     [provider]  XARELTO 20 MG TABS tablet TAKE 1 TABLET (20 MG TOTAL) BY MOUTH DAILY WITH SUPPER. 09/13/18   [name], MD    Family History Family History  Problem Relation Age of Onset  . Diabetes Mother   . Hypertension Mother   . AAA (abdominal aortic aneurysm) Mother   . Colon cancer Neg Hx   . Stomach cancer Neg Hx     Social History Social History   Tobacco Use  . Smoking status: Former Smoker    Types: Cigarettes    Quit date: 05/12/2004    Years since quitting: 14.5  . Smokeless tobacco: Never Used  Substance Use Topics  . Alcohol use: No  . Drug use: No     Allergies   Penicillins   Review of Systems Review of Systems  All other systems reviewed and are  negative.    Physical Exam Updated Vital Signs BP 120/60 (BP Location: Left Arm)   Pulse 74   Temp 98.1 F (36.7 C) (Oral)   Resp 14   Ht _0  (1.702 m)   Wt 63 kg   LMP 02/26/2014   SpO2 100%   BMI 21.77 kg/m   Physical Exam Vitals signs and nursing note reviewed.  Constitutional:      General: She is not in acute distress.    Appearance: She is well-developed.  HENT:     Head: Normocephalic and atraumatic.  Neck:     Musculoskeletal: Normal range of motion.  Cardiovascular:     Rate and Rhythm: Normal rate and regular rhythm.     Heart sounds: Normal heart sounds.  Pulmonary:     Effort: Pulmonary effort is normal.     Breath sounds: Normal breath sounds.  Chest:     Comments: Midline pick in left anterior chest without surrounding signs of infection Abdominal:     General: There is no distension.     Palpations: Abdomen is soft.     Tenderness: There is no abdominal tenderness.  Musculoskeletal: Normal range of motion.  Skin:    General: Skin is warm and dry.  Neurological:     Mental Status: She is alert and oriented to person, place, and time.  Psychiatric:        Judgment: Judgment normal.      ED Treatments / Results  Labs (all labs ordered are listed, but only abnormal results are displayed) Labs Reviewed  CULTURE, BLOOD (ROUTINE X 2)  CULTURE, BLOOD (ROUTINE X 2)    EKG None  Radiology No results found.  Procedures Procedures (including critical care time)  Medications Ordered in ED Medications  ceFAZolin (ANCEF) IVPB 1 g/50 mL premix (has no administration in time  range)     Initial Impression / Assessment and Plan / ED Course  I have reviewed the triage vital signs and the nursing notes.  Pertinent labs & imaging results that were available during my care of the patient were reviewed by me and considered in my medical decision making (see chart for details).        Blood cultures obtained.  I discussed the case with Dr.  Johnnye Sima, infectious disease over the phone.  He agrees that the choice from a medical standpoint would be blood cultures now and removal of the PICC line and home on oral Bactrim.  I discussed the patient's desire to keep her midline PICC line given poor alternative access.  He states that a second but less desirable option would be blood cultures now followed by IV Ancef until cultures are negative.  Both options were discussed at length with the patient and the patient's spouse and the patient has elected to attempt IV antibiotics with peripheral blood cultures now.  Patient understands that this is a less optimal approach from an infectious disease standpoint but her desire to keep this PICC line given her limited access points has persuaded her decision to try to treat through this.  She understands that this is not the best choice from an infectious disease standpoint but she believes this is the best choice for her at this time.  I have arranged with her home infusion therapy team for Q8 hr 1 g Ancef x 5 days.  She and her GI team will follow up on the blood cultures.  She understands the importance of returning the emergency department for any new or worsening symptoms.  All questions were answered.  She has no other complaints at this time.  She is overall well-appearing.  She has the capacity to make her own medical decisions.  Her husband was present for these conversations    Final Clinical Impressions(s) / ED Diagnoses   Final diagnoses:  None    ED Discharge Orders    None       Jola Schmidt, MD 12/07/18 1610

## 2018-12-07 NOTE — ED Notes (Signed)
Per Dr Vena Rua office, states patient has a central line for TPN administration-states now, when she receives infusion she gets the "chills"-states IR will not replace line for 2 days, they need site cultured-sending patient here for IV antibiotics and admission-states patient is a hard stick and will probably need a PICC line

## 2018-12-08 ENCOUNTER — Telehealth (HOSPITAL_BASED_OUTPATIENT_CLINIC_OR_DEPARTMENT_OTHER): Payer: Self-pay | Admitting: Emergency Medicine

## 2018-12-08 ENCOUNTER — Emergency Department (HOSPITAL_COMMUNITY)
Admission: EM | Admit: 2018-12-08 | Discharge: 2018-12-09 | Disposition: A | Discharge status: Home / Self Care | Payer: Medicare Other | Attending: Emergency Medicine | Admitting: Emergency Medicine

## 2018-12-08 ENCOUNTER — Other Ambulatory Visit: Payer: Self-pay

## 2018-12-08 ENCOUNTER — Encounter (HOSPITAL_COMMUNITY): Payer: Self-pay | Admitting: Emergency Medicine

## 2018-12-08 DIAGNOSIS — Z7901 Long term (current) use of anticoagulants: Secondary | ICD-10-CM | POA: Diagnosis not present

## 2018-12-08 DIAGNOSIS — R7881 Bacteremia: Secondary | ICD-10-CM | POA: Diagnosis not present

## 2018-12-08 DIAGNOSIS — Z79899 Other long term (current) drug therapy: Secondary | ICD-10-CM | POA: Diagnosis not present

## 2018-12-08 DIAGNOSIS — Z87891 Personal history of nicotine dependence: Secondary | ICD-10-CM | POA: Diagnosis not present

## 2018-12-08 DIAGNOSIS — R7989 Other specified abnormal findings of blood chemistry: Secondary | ICD-10-CM | POA: Diagnosis present

## 2018-12-08 LAB — BLOOD CULTURE ID PANEL (REFLEXED)

## 2018-12-08 LAB — CBC WITH DIFFERENTIAL/PLATELET
Abs Immature Granulocytes: 0.02 10*3/uL (ref 0.00–0.07)
Basophils Absolute: 0 10*3/uL (ref 0.0–0.1)
Basophils Relative: 0 %
Eosinophils Absolute: 0.3 10*3/uL (ref 0.0–0.5)
Eosinophils Relative: 4 %
HCT: 36 % (ref 36.0–46.0)
Hemoglobin: 11.2 g/dL — ABNORMAL LOW (ref 12.0–15.0)
Immature Granulocytes: 0 %
Lymphocytes Relative: 19 %
Lymphs Abs: 1.6 10*3/uL (ref 0.7–4.0)
MCH: 31.9 pg (ref 26.0–34.0)
MCHC: 31.1 g/dL (ref 30.0–36.0)
MCV: 102.6 fL — ABNORMAL HIGH (ref 80.0–100.0)
Monocytes Absolute: 1.1 10*3/uL — ABNORMAL HIGH (ref 0.1–1.0)
Monocytes Relative: 13 %
Neutro Abs: 5.4 10*3/uL (ref 1.7–7.7)
Neutrophils Relative %: 64 %
Platelets: 99 10*3/uL — ABNORMAL LOW (ref 150–400)
RBC: 3.51 MIL/uL — ABNORMAL LOW (ref 3.87–5.11)
RDW: 15.2 % (ref 11.5–15.5)
WBC: 8.4 10*3/uL (ref 4.0–10.5)
nRBC: 0 % (ref 0.0–0.2)

## 2018-12-08 LAB — COMPREHENSIVE METABOLIC PANEL
ALT: 15 U/L (ref 0–44)
AST: 17 U/L (ref 15–41)
Albumin: 3.4 g/dL — ABNORMAL LOW (ref 3.5–5.0)
Alkaline Phosphatase: 89 U/L (ref 38–126)
Anion gap: 9 (ref 5–15)
BUN: 17 mg/dL (ref 8–23)
CO2: 19 mmol/L — ABNORMAL LOW (ref 22–32)
Calcium: 8.4 mg/dL — ABNORMAL LOW (ref 8.9–10.3)
Chloride: 112 mmol/L — ABNORMAL HIGH (ref 98–111)
Creatinine, Ser: 1.11 mg/dL — ABNORMAL HIGH (ref 0.44–1.00)
GFR calc Af Amer: 59 mL/min — ABNORMAL LOW (ref >60)
GFR calc non Af Amer: 51 mL/min — ABNORMAL LOW (ref >60)
Glucose, Bld: 71 mg/dL (ref 70–99)
Potassium: 3.1 mmol/L — ABNORMAL LOW (ref 3.5–5.1)
Sodium: 140 mmol/L (ref 135–145)
Total Bilirubin: 0.4 mg/dL (ref 0.3–1.2)
Total Protein: 8.2 g/dL — ABNORMAL HIGH (ref 6.5–8.1)

## 2018-12-08 LAB — LACTIC ACID, PLASMA: Lactic Acid, Venous: 1.2 mmol/L (ref 0.5–1.9)

## 2018-12-08 MED ORDER — SODIUM CHLORIDE 0.9 % IV BOLUS
1000.0000 mL | Freq: Once | INTRAVENOUS | Status: AC
  Administered 2018-12-08: 1000 mL via INTRAVENOUS

## 2018-12-08 MED ORDER — POTASSIUM CHLORIDE CRYS ER 20 MEQ PO TBCR
40.0000 meq | EXTENDED_RELEASE_TABLET | Freq: Once | ORAL | Status: AC
  Administered 2018-12-08: 23:00:00 40 meq via ORAL
  Filled 2018-12-08: qty 2

## 2018-12-08 MED ORDER — CEFEPIME HCL 2 G IV SOLR: 2.0000 g | Freq: Three times a day (TID) | INTRAVENOUS | 0 refills | Status: DC

## 2018-12-08 MED ORDER — SODIUM CHLORIDE 0.9 % IV SOLN
2.0000 g | Freq: Once | INTRAVENOUS | Status: AC
  Administered 2018-12-08: 23:00:00 2 g via INTRAVENOUS
  Filled 2018-12-08: qty 2

## 2018-12-08 NOTE — Telephone Encounter (Signed)
Spoke with Dr. Alvino Chapel about blood culture result. He said to call the patient and have her come back to the ED at [facility] or [facility] immediately.  I called [name] and explained this to her and she stated that she would "get myself ready and go".

## 2018-12-08 NOTE — ED Notes (Signed)
Second set blood cultures unsuccessful Dr Billy Fischer aware

## 2018-12-08 NOTE — ED Provider Notes (Signed)
[facility] DEPT Provider Note   CSN: [id number] Arrival date & time: 12/08/18  9449     History   Chief Complaint Chief Complaint  Patient presents with   Abnormal Lab    HPI [name] [name] is a 68 y.o. female.     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  Patient reports that over the last few days, she noted chills beginning with her TPN infusions, as well as a few episodes of diaphoresis when they completed.  She denies any known fevers.  Denies any other infectious symptoms including no cough, urinary symptoms, nausea, vomiting, diarrhea, chest pain or shortness of breath.  Yesterday given concern for possible line infection by symptoms, Dr. Venora Maples had discussed with infectious disease doctor on-call, and they discussed the recommendation to remove her current line, admit her for IV antibiotics, however patient declined due to concerns that she has had so many lines that she does not have any appropriate veins left.  Blood cultures drawn yesterday grew out Enterobacter and Klebsiella with sensitivities pending.  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   Closed left subtrochanteric femur fracture ([place]) 12/31/2017   Hypotension 12/30/2017   Bradycardia 12/30/2017   Deep venous thrombosis ([place]) left subclavian vein 07/31/2017   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   Degenerative joint disease 07/08/2016   Mild malnutrition ([place]) 05/08/2016   H/O mesenteric infarction 11/17/2013   Splenomegaly    Hx of adenomatous colonic polyps 67/59/1638   Chronic systolic heart failure ([place]) 12/25/2011   Nutritional deficiency 06/04/2011   Osteoporosis 03/14/2010   Anemia of chronic disease 10/01/2009   GERD (gastroesophageal reflux disease) 11/19/2008   ALKALINE PHOSPHATASE, ELEVATED  10/12/2008   VITAMIN D DEFICIENCY 01/19/2008   TRANSAMINASES, SERUM, ELEVATED 01/19/2008   Bacterial overgrowth syndrome 11/19/2004   Acquired short bowel syndrome 11/20/2003    Past Surgical History:  Procedure Laterality Date   APPENDECTOMY  yrs ago   CHOLECYSTECTOMY  yrs ago   COLONOSCOPY  12/05/2005   internal hemorrhoids (for polyp surveillance)   COLONOSCOPY  04/26/2012   Procedure: COLONOSCOPY;  Surgeon: Gatha Mayer, MD;   Location: [facility];  Service: Endoscopy;  Laterality: N/A;   COLONOSCOPY WITH PROPOFOL N/A 03/18/2016   Procedure: COLONOSCOPY WITH PROPOFOL;  Surgeon: Gatha Mayer, MD;  Location: [facility];  Service: Endoscopy;  Laterality: N/A;   ESOPHAGOGASTRODUODENOSCOPY  01/22/2009   erosive esophagitis   HARDWARE REMOVAL Left 12/31/2017   Procedure: HARDWARE REMOVAL;  Surgeon: Rod Can, MD;  Location: [facility];  Service: Orthopedics;  Laterality: Left;   INTRAMEDULLARY (IM) NAIL INTERTROCHANTERIC Left 12/31/2017   Procedure: INTRAMEDULLARY (IM) NAIL SUBTROCHANTRIC;  Surgeon: Rod Can, MD;  Location: [facility];  Service: Orthopedics;  Laterality: Left;   IR CV LINE INJECTION  03/23/2018   IR FLUORO GUIDE CV LINE LEFT  01/01/2018   IR FLUORO GUIDE CV LINE LEFT  03/24/2018   IR FLUORO GUIDE CV LINE LEFT  05/21/2018   IR FLUORO GUIDE CV LINE LEFT  07/09/2018   IR GENERIC HISTORICAL  07/04/2016   IR REMOVAL TUN CV CATH W/O FL 07/04/2016 Ascencion Dike, PA-C [facility]   IR GENERIC HISTORICAL  07/10/2016   IR US GUIDE VASC ACCESS LEFT 07/10/2016 Arne Cleveland, MD [facility]   IR GENERIC HISTORICAL  07/10/2016   IR FLUORO GUIDE CV LINE LEFT 07/10/2016 Arne Cleveland, MD [facility]   IR PATIENT EVAL TECH 0-60 MINS  10/07/2017   IR PATIENT EVAL TECH 0-60 MINS  03/09/2018   IR RADIOLOGIST EVAL & MGMT  07/21/2017   ORIF PROXIMAL FEMORAL FRACTURE W/ ITST NAIL SYSTEM  03/2007   left, Dr. Shellia Carwin   SMALL INTESTINE SURGERY  2005   multiple with right colon resection for ischemia/infarct     OB History    Gravida  2   Para  2   Term      Preterm      AB      Living  1     SAB      TAB      Ectopic      Multiple      Live Births               Home Medications    Prior to Admission medications   Medication Sig Start Date End Date Taking? Authorizing Provider  acetaminophen (TYLENOL) 500 MG tablet Take 1,000 mg by mouth daily as needed for mild pain.   Yes  [provider]  ADULT TPN 1,800 mLs 4 (four) times a week. Pt receives home TPN from [facility]:  1800 mL bag, four nights weekly (Monday, Tuesday, Wednesday, Thursday for 8 hours (includes 1 hour taper up and down).   Yes [provider]  diphenhydrAMINE (BENADRYL) 25 mg capsule Take 25-50 mg by mouth every 6 (six) hours as needed for itching or sleep.    Yes [provider]  Multiple Vitamin (MULTIVITAMIN WITH MINERALS) TABS Take 1 tablet by mouth daily.   Yes [provider]  omeprazole (PRILOSEC) 40 MG capsule TAKE 1 CAPSULE 2 (TWO) TIMES DAILY BY MOUTH. TAKE BEFORE BREAKFAST AND SUPPER 09/14/18  Yes Gatha Mayer, MD  PRESCRIPTION MEDICATION 5 mLs See admin instructions. 50 units Heparin sodium flush   Yes [provider]  Teduglutide, rDNA, (GATTEX) 5 MG KIT Inject 3.3 Units into the skin daily. 09/11/14  Yes Gatha Mayer, MD  vitamin E 400 UNIT capsule Take 400 Units by mouth daily.    Yes [provider]  XARELTO 20 MG TABS tablet TAKE 1 TABLET (20 MG TOTAL) BY MOUTH DAILY WITH SUPPER. Patient taking differently: Take 20 mg by mouth daily with supper.  09/13/18  Yes [name], MD  azithromycin (ZITHROMAX) 250 MG tablet Take 1 tablet (250 mg total) by mouth daily. Take first 2 tablets together, then 1 every day until finished. Patient not taking: Reported on 12/08/2018 05/28/18   Hayden Rasmussen, MD  benzonatate (TESSALON) 100 MG capsule Take 1 capsule (100 mg total) by mouth every 8 (eight) hours. Patient not taking: Reported on 12/08/2018 05/28/18   Hayden Rasmussen, MD  ceFEPIme (MAXIPIME) 2 g injection Inject 2 g into the vein every 8 (eight) hours. 12/08/18   Gareth Morgan, MD  diphenoxylate-atropine (LOMOTIL) 2.5-0.025 MG tablet Take 1 tablet by mouth 4 (four) times daily as needed for diarrhea or loose stools. 03/24/16   Gatha Mayer, MD  ondansetron (ZOFRAN-ODT) 8 MG disintegrating tablet Take 1 tablet (8 mg total) by  mouth every 8 (eight) hours as needed for nausea or vomiting. 06/03/18   Gatha Mayer, MD  sodium chloride 0.9 % infusion Uses for TPN 10/27/17   [provider]  VENTOLIN HFA 108 (90 Base) MCG/ACT inhaler TAKE 2 PUFFS BY MOUTH EVERY 6 HOURS AS NEEDED FOR WHEEZE OR SHORTNESS OF BREATH Patient taking differently: Inhale 2 puffs into the lungs every 6 (six) hours as needed for wheezing or shortness of breath.  06/15/18   [name], MD    Family History Family History  Problem Relation Age of Onset   Diabetes Mother    Hypertension Mother    AAA (abdominal aortic aneurysm) Mother    Colon cancer Neg Hx    Stomach cancer Neg Hx     Social History Social History   Tobacco Use   Smoking status: Former Smoker    Types: Cigarettes    Quit date: 05/12/2004    Years since quitting: 14.5   Smokeless tobacco: Never Used  Substance Use Topics   Alcohol use: No   Drug use: No     Allergies   Penicillins   Review of Systems Review of Systems  Constitutional: Positive for chills. Negative for fever.  HENT: Negative for sore throat.   Eyes: Negative for visual disturbance.  Respiratory: Negative for cough and shortness of breath.   Cardiovascular: Negative for chest pain.  Gastrointestinal: Negative for abdominal pain, diarrhea, nausea and vomiting.  Genitourinary: Negative for difficulty urinating.  Musculoskeletal: Negative for back pain and neck pain.  Skin: Negative for rash.  Neurological: Negative for syncope and headaches.     Physical Exam Updated Vital Signs BP 134/83 (BP Location: Right Arm)    Pulse 74    Temp 98.6 F (37 C) (Oral)    Resp 18    LMP 02/26/2014    SpO2 100%   Physical Exam Vitals signs and nursing note reviewed.  Constitutional:      General: She is not in acute distress.    Appearance: She is well-developed. She is not diaphoretic.  HENT:     Head: Normocephalic and atraumatic.  Eyes:  Conjunctiva/sclera:  Conjunctivae normal.  Neck:     Musculoskeletal: Normal range of motion.  Cardiovascular:     Rate and Rhythm: Normal rate and regular rhythm.     Comments: Line in place without surrounding erythema Pulmonary:     Effort: Pulmonary effort is normal. No respiratory distress.  Abdominal:     General: There is no distension.     Palpations: Abdomen is soft.     Tenderness: There is no abdominal tenderness. There is no guarding.  Musculoskeletal:        General: No tenderness.  Skin:    General: Skin is warm and dry.     Findings: No erythema or rash.  Neurological:     Mental Status: She is alert and oriented to person, place, and time.      ED Treatments / Results  Labs (all labs ordered are listed, but only abnormal results are displayed) Labs Reviewed  CBC WITH DIFFERENTIAL/PLATELET - Abnormal; Notable for the following components:      Result Value   RBC 3.51 (*)    Hemoglobin 11.2 (*)    MCV 102.6 (*)    Platelets 99 (*)    Monocytes Absolute 1.1 (*)    All other components within normal limits  COMPREHENSIVE METABOLIC PANEL - Abnormal; Notable for the following components:   Potassium 3.1 (*)    Chloride 112 (*)    CO2 19 (*)    Creatinine, Ser 1.11 (*)    Calcium 8.4 (*)    Total Protein 8.2 (*)    Albumin 3.4 (*)    GFR calc non Af Amer 51 (*)    GFR calc Af Amer 59 (*)    All other components within normal limits  CULTURE, BLOOD (ROUTINE X 2)  CULTURE, BLOOD (ROUTINE X 2)  LACTIC ACID, PLASMA  LACTIC ACID, PLASMA    EKG None  Radiology No results found.  Procedures Procedures (including critical care time)  Medications Ordered in ED Medications  sodium chloride 0.9 % bolus 1,000 mL (1,000 mLs Intravenous New Bag/Given 12/08/18 2120)  ceFEPIme (MAXIPIME) 2 g in sodium chloride 0.9 % 100 mL IVPB (2 g Intravenous New Bag/Given 12/08/18 2233)  potassium chloride SA (K-DUR) CR tablet 40 mEq (40 mEq Oral Given 12/08/18 2258)     Initial Impression /  Assessment and Plan / ED Course  I have reviewed the triage vital signs and the nursing notes.  Pertinent labs & imaging results that were available during my care of the patient were reviewed by me and considered in my medical decision making (see chart for details).        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  Blood cultures drawn yesterday grew Enterobacter and Klebsiella.  Discussed with Dr. Johnnye Sima, infectious disease doctor on call.  Again we discussed that ideally we would remove her line, place her on IV antibiotics and admit her to the hospital, however patient has a strong preference to attempt to salvage her line due to concerns that she would be unable to obtain another one, or concerned that the next line they would have to place would be femoral and would  affect her quality of life.  Discussed this with Dr. Johnnye Sima and patient, and we agreed to attempt to salvage her current line and use IV antibiotic therapy, with patient understanding the risk of failure of this approach.  She is hemodynamically stable, well-appearing, with normal lactic acid.  Discussed my other recommendation to admit patient to the hospital for cefepime administration.  After discussion with Dr. Johnnye Sima, he recommends cefepime over Ancef due to bacteremia detected until sensitivities return.  Patient was given 1 dose of cefepime in the emergency department, but declines admission for further IV antibiotics.  At this time, there is currently no case manager available to coordinate outpatient IV antibiotics.  Discussed with patient that it  will be impossible for her to receive her morning dose of cefepime, and she states understanding.  Discussed that ideally we would be treating her with this antibiotic at this time.  Patient declines admission to the hospital.  She states she will give herself Ancef again in the morning and call the case manager to help expedite obtaining cefepime for administration at home.  Wrote prescription for cefepime, placed case manager consult as well as home health. Patient discharged in stable condition with understanding of reasons to return.   Final Clinical Impressions(s) / ED Diagnoses   Final diagnoses:  Bacteremia    ED Discharge Orders         Ordered    ceFEPIme (MAXIPIME) 2 g injection  Every 8 hours     12/08/18 [place]     12/08/18 2234    Face-to-face encounter (required for Medicare/Medicaid patients)    Comments: I Tennis Must certify that this patient is under my care and that I, or a nurse practitioner or physician's assistant working with me, had a face-to-face encounter that meets the physician face-to-face encounter requirements with this patient on 12/08/2018. The encounter with the patient was in whole, or in part for the following medical condition(s) which is the primary reason for home health care (List medical condition): hx of short bowel, on TPN through line, hx of line infections, now with bacteremia. Needs home antibiotics to infuse. Can self infuse antibiotics as she has in the past but needs change of antibiotics from type that she was prescribed yesterday.   12/08/18 2234           Gareth Morgan, MD 12/08/18 [phone]

## 2018-12-08 NOTE — Progress Notes (Signed)
CSW received a call from pt's EPD stating pt has medical equipment at home for pt's IV meds, but still needs the meds.  EPD will place a consult for CM with an order for face-to-face and an order for [place] (if needed) and for the pt's meds.   2nd shift ED CSW will leave handoff for 1st shift TOC ED CM.  Please reconsult if future social work needs arise.  CSW signing off, as social work intervention is no longer needed.  Alphonse Guild. [name] Ovitt, LCSW, LCAS, CSI Transitions of Care Clinical Social Worker Care Coordination Department Ph: [phone]

## 2018-12-08 NOTE — ED Triage Notes (Signed)
Patient here from home reporting that someone called her about abnormal blood culture results. Has central line.

## 2018-12-09 ENCOUNTER — Other Ambulatory Visit: Payer: Self-pay

## 2018-12-09 ENCOUNTER — Telehealth: Payer: Self-pay

## 2018-12-09 ENCOUNTER — Telehealth: Payer: Self-pay | Admitting: *Deleted

## 2018-12-09 NOTE — Telephone Encounter (Signed)
Pt would like to know when she can infuse TPN and hydration.

## 2018-12-09 NOTE — Telephone Encounter (Signed)
Patient notified of Dr. Celesta Aver recommendations.  She does not wish to return to the hospital at this time.  She will follow up with Dr. Johnnye Sima about when the line is safe to use.

## 2018-12-09 NOTE — Telephone Encounter (Addendum)
12:23 pm TOC CM received call from pt to follow up on IV abx that was prescribed. On 12/07/2018 Ed visit it was requested that pt be admitted but pt at that time states she refused due to fear of COVID 19 and her compromised immune system. Explained [facility] has taking special precautions to for pt who are in hospital to avoid them getting COVID 19 while in hospital. She states she prefers to receive IV abx in her home with [facility]. States they provide HHRN to assist with IV abx.  TOC CM did follow up with ED attending Dr. Billy Fischer. MD will call [facility] with new Rx. Faxed [place] orders, facesheet and progress notes to [facility], fax [phone], #   [phone]. Will call pharmacy to follow up on IV abx. Pt will continue to follow up with Dr Algis Downs office. Jonnie Finner RN CCM Case Mgmt phone [phone]   4:24 pm TOC CM contacted [facility] and information was received. Will deliver medication to pt today. Pt is familiar with administering IV abx and will have a HH RN provided by [facility].  They received call in Rx for medication and ID, Dr Johnnye Sima is requesting q 12 hours x 14 days for Cefepime 2g. ED attending updated. Pt will continue to follow up with ID MD, Dr Johnnye Sima in the office.Jonnie Finner RN CCM Case Mgmt phone [phone]

## 2018-12-09 NOTE — Telephone Encounter (Signed)
I am not sure  Out of my realm  She received recommendations to be admitted - remove line and receive IV Abx and re-establish a new line - but declined  She has seen ID in past and they provided advice over the phone to ED docs  She needs ID f/u regarding this  I would be concerned that putting fluids or TPN through the line could make infection worse and cause harm but again not my expertise  I understand her concerns but admission and removal of catheter safest route for her  She was supposed to get started on cefipime at home but not sure that happened (see ED notes)  So:  ID f/u outpatient vs inpatient I recommend she gets admitted despite her concerns - she should know everyone at hospital (inpatients) are tested for Covid-19 so I think risks of contracting that at hospital likely less than outside of hospital depending upon the circumstances outside

## 2018-12-09 NOTE — Telephone Encounter (Signed)
Called and changed her rx thanks

## 2018-12-09 NOTE — Telephone Encounter (Signed)
Patient was seen in the emergency room and blood culture completed.   She returned yesterday for IV antibiotic infusion and script was called into incorrect pharmacy. She uses [facility].   Requesting Dr Johnnye Sima transfer Maxipime 2 gram injections to [facility].  I will check with Dr Johnnye Sima and return call.     Laverle Patter, [facility] :  [phone] phone

## 2018-12-10 ENCOUNTER — Telehealth: Payer: Self-pay

## 2018-12-10 ENCOUNTER — Ambulatory Visit: Payer: Medicare Other | Admitting: Physician Assistant

## 2018-12-10 ENCOUNTER — Telehealth: Payer: Self-pay | Admitting: Family Medicine

## 2018-12-10 LAB — CULTURE, BLOOD (ROUTINE X 2): Special Requests: ADEQUATE

## 2018-12-10 NOTE — Telephone Encounter (Signed)
Since pt was in ER twice this week and hospitalization was recommended both times, there is nothing we can do at this office.  She will need to reach out to ID as they are the ones managing her infection and any blood work that is needed would need to come from them.

## 2018-12-10 NOTE — Telephone Encounter (Signed)
Patient recently released from the hospital. Inquired about follow up appointment with Dr. Johnnye Sima and follow up blood cultures. No follow up indicated on ED AVS. Routing to MD a for advise.  Eugenia Mcalpine, LPN

## 2018-12-10 NOTE — Telephone Encounter (Signed)
Spoke with pt she was advised of PCP recommendations and told that until Dr. Algis Downs office gave her the ok that the infection was gone there was no need to follow with [name] as there is nothing that we can do for her here. Pt stated an understanding and was calling their office again this morning.

## 2018-12-10 NOTE — Telephone Encounter (Signed)
Thanks I sent a note to Ms Trenton Gammon and Ms Edison Pace that pt should be seen in ~ 14 days Thanks

## 2018-12-10 NOTE — Telephone Encounter (Signed)
Patient called in to make a virtual visit with [name] but there were no appt till August 13 so she said she would see Elyn Aquas for a virtual visit. Patient stated that she had been in the ER twice this week stating she had an infection in her blood. She wanted to do the virtual visit so blood work could be put in and to discuss what she should do.

## 2018-12-11 ENCOUNTER — Telehealth: Payer: Self-pay | Admitting: Emergency Medicine

## 2018-12-11 NOTE — Progress Notes (Signed)
ED Antimicrobial Stewardship Positive Culture Follow Up   [name] [name] is an 68 y.o. female who presented to [facility] on 12/08/2018 with a chief complaint of  Chief Complaint  Patient presents with  . Abnormal Lab    Recent Results (from the past 720 hour(s))  Blood culture (routine x 2)     Status: Abnormal   Collection Time: 12/07/18  3:31 PM   Specimen: BLOOD RIGHT HAND  Result Value Ref Range Status   Specimen Description   Final    BLOOD RIGHT HAND Performed at [facility] [address], [place], [place] [address]    Special Requests   Final    BOTTLES DRAWN AEROBIC ONLY Blood Culture adequate volume Performed at [facility] [address], [place], [place] [address]    Culture  Setup Time   Final    GRAM NEGATIVE RODS AEROBIC BOTTLE ONLY CRITICAL RESULT CALLED TO, READ BACK BY AND VERIFIED WITH: Georges Mouse RN [facility]) 12/08/18 1840 JDW Performed at [facility], [place] [address] [place], [place] [address]    Culture KLEBSIELLA PNEUMONIAE (A)  Final   Report Status 12/10/2018 FINAL  Final   Organism ID, Bacteria KLEBSIELLA PNEUMONIAE  Final      Susceptibility   Klebsiella pneumoniae - MIC*    AMPICILLIN RESISTANT Resistant     CEFAZOLIN <=4 SENSITIVE Sensitive     CEFEPIME <=1 SENSITIVE Sensitive     CEFTAZIDIME <=1 SENSITIVE Sensitive     CEFTRIAXONE <=1 SENSITIVE Sensitive     CIPROFLOXACIN <=0.25 SENSITIVE Sensitive     GENTAMICIN <=1 SENSITIVE Sensitive     IMIPENEM <=0.25 SENSITIVE Sensitive     TRIMETH/SULFA <=20 SENSITIVE Sensitive     AMPICILLIN/SULBACTAM 4 SENSITIVE Sensitive     PIP/TAZO <=4 SENSITIVE Sensitive     Extended ESBL NEGATIVE Sensitive     * KLEBSIELLA PNEUMONIAE  Blood Culture ID Panel (Reflexed)     Status: Abnormal   Collection Time: 12/07/18  3:31 PM  Result Value Ref Range Status   Enterococcus species NOT DETECTED NOT DETECTED Final   Listeria monocytogenes NOT DETECTED NOT DETECTED Final    Staphylococcus species NOT DETECTED NOT DETECTED Final   Staphylococcus aureus (BCID) NOT DETECTED NOT DETECTED Final   Streptococcus species NOT DETECTED NOT DETECTED Final   Streptococcus agalactiae NOT DETECTED NOT DETECTED Final   Streptococcus pneumoniae NOT DETECTED NOT DETECTED Final   Streptococcus pyogenes NOT DETECTED NOT DETECTED Final   Acinetobacter baumannii NOT DETECTED NOT DETECTED Final   Enterobacteriaceae species DETECTED (A) NOT DETECTED Final    Comment: Enterobacteriaceae represent a large family of gram-negative bacteria, not a single organism. CRITICAL RESULT CALLED TO, READ BACK BY AND VERIFIED WITH: R MARCUM RN ([facility]) 12/08/18 1840 JDW    Enterobacter cloacae complex NOT DETECTED NOT DETECTED Final   Escherichia coli NOT DETECTED NOT DETECTED Final   Klebsiella oxytoca NOT DETECTED NOT DETECTED Final   Klebsiella pneumoniae DETECTED (A) NOT DETECTED Final    Comment: CRITICAL RESULT CALLED TO, READ BACK BY AND VERIFIED WITH: R MARCUM RN ([facility]) 12/08/18 1840 JDW    Proteus species NOT DETECTED NOT DETECTED Final   Serratia marcescens NOT DETECTED NOT DETECTED Final   Carbapenem resistance NOT DETECTED NOT DETECTED Final   Haemophilus influenzae NOT DETECTED NOT DETECTED Final   Neisseria meningitidis NOT DETECTED NOT DETECTED Final   Pseudomonas aeruginosa NOT DETECTED NOT DETECTED Final   Candida albicans NOT DETECTED NOT  DETECTED Final   Candida glabrata NOT DETECTED NOT DETECTED Final   Candida krusei NOT DETECTED NOT DETECTED Final   Candida parapsilosis NOT DETECTED NOT DETECTED Final   Candida tropicalis NOT DETECTED NOT DETECTED Final    Comment: Performed at [facility], [place] [address] [place], [place] [address]  Blood culture (routine x 2)     Status: None (Preliminary result)   Collection Time: 12/07/18  4:05 PM   Specimen: BLOOD  Result Value Ref Range Status   Specimen Description   Final    BLOOD SITE NOT SPECIFIED Performed at [facility], [place] [address], [place], [place] [address]    Special Requests   Final    BOTTLES DRAWN AEROBIC AND ANAEROBIC Blood Culture adequate volume Performed at [facility] [address] [place], [place] [address]    Culture   Final    NO GROWTH 4 DAYS Performed at [name], [place] [address] [place], [place] [address]    Report Status PENDING  Incomplete  Blood culture (routine x 2)     Status: None (Preliminary result)   Collection Time: 12/08/18  8:31 PM   Specimen: BLOOD LEFT FOREARM  Result Value Ref Range Status   Specimen Description   Final    BLOOD LEFT FOREARM Performed at [facility], [place] [address], [place], [place] [address]    Special Requests   Final    BOTTLES DRAWN AEROBIC AND ANAEROBIC Blood Culture adequate volume Performed at [facility] [address], [place], [place] [address]    Culture   Final    NO GROWTH 2 DAYS Performed at [facility] [address], [place], [place] [address]    Report Status PENDING  Incomplete     Spoke with Dr. Johnnye Sima - patient is being followed and currently on cefepime IV outpatient. No further action needed by pharmacist at this time.   Lenis Noon, PharmD 12/11/2018, 11:15 AM Clinical Pharmacist [phone]

## 2018-12-11 NOTE — Telephone Encounter (Signed)
Post ED Visit - Positive Culture Follow-up  Culture report reviewed by antimicrobial stewardship pharmacist: [place] Team []  Elenor Quinones, Pharm.D. []  Heide Guile, Pharm.D., BCPS AQ-ID []  Parks Neptune, Pharm.D., BCPS []  Alycia Rossetti, Pharm.D., BCPS []  Bartlett, Pharm.D., BCPS, AAHIVP []  Legrand Como, Pharm.D., BCPS, AAHIVP []  Salome Arnt, PharmD, BCPS []  Johnnette Gourd, PharmD, BCPS []  Hughes Better, PharmD, BCPS []  Leeroy Cha, PharmD []  Laqueta Linden, PharmD, BCPS []  Albertina Parr, PharmD  [place] Team []  Leodis Sias, PharmD []  Lindell Spar, PharmD []  Royetta Asal, PharmD []  Graylin Shiver, Rph []  Rema Fendt) Glennon Mac, PharmD []  Arlyn Dunning, PharmD []  Netta Cedars, PharmD []  Dia Sitter, PharmD []  Leone Haven, PharmD []  Gretta Arab, PharmD [x]  Theodis Shove, PharmD []  Peggyann Juba, PharmD []  Reuel Boom, PharmD   Positive blood culture Treated with Cefepime, organism sensitive to the same and no further patient follow-up is required at this time.  Sandi Raveling Bucky Grigg 12/11/2018, 3:59 PM

## 2018-12-12 LAB — CULTURE, BLOOD (ROUTINE X 2)
Culture: NO GROWTH
Special Requests: ADEQUATE

## 2018-12-14 LAB — CULTURE, BLOOD (ROUTINE X 2)
Culture: NO GROWTH
Special Requests: ADEQUATE

## 2018-12-14 NOTE — Telephone Encounter (Signed)
Patient aware of new appointment date/time. Eugenia Mcalpine, LPN

## 2018-12-23 ENCOUNTER — Ambulatory Visit (INDEPENDENT_AMBULATORY_CARE_PROVIDER_SITE_OTHER): Payer: Medicare Other | Admitting: Internal Medicine

## 2018-12-23 ENCOUNTER — Other Ambulatory Visit: Payer: Self-pay

## 2018-12-23 ENCOUNTER — Encounter: Payer: Self-pay | Admitting: Internal Medicine

## 2018-12-23 VITALS — BP 148/86 | HR 68 | Temp 97.7°F

## 2018-12-23 DIAGNOSIS — S72009A Fracture of unspecified part of neck of unspecified femur, initial encounter for closed fracture: Secondary | ICD-10-CM | POA: Diagnosis not present

## 2018-12-23 DIAGNOSIS — T80219A Unspecified infection due to central venous catheter, initial encounter: Secondary | ICD-10-CM

## 2018-12-23 DIAGNOSIS — S7225XA Nondisplaced subtrochanteric fracture of left femur, initial encounter for closed fracture: Secondary | ICD-10-CM | POA: Diagnosis not present

## 2018-12-23 DIAGNOSIS — S72002A Fracture of unspecified part of neck of left femur, initial encounter for closed fracture: Secondary | ICD-10-CM | POA: Diagnosis not present

## 2018-12-23 MED ORDER — CLOBETASOL PROPIONATE 0.05 % EX OINT: 1.0000 "application " | TOPICAL_OINTMENT | Freq: Two times a day (BID) | CUTANEOUS | 0 refills | Status: DC

## 2018-12-23 NOTE — Progress Notes (Signed)
[facility] for Infectious Disease      Reason for Consult: possible line infection     Referring Physician: Dr. Billy Fischer    Patient ID: [name] [name], female    DOB: 1950-11-19, 68 y.o.   MRN: [id number]  HPI:   Here for evaluation of a line infection.  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 Carlean Purl and history of line infections.  She was seen in the ED about 2 weeks ago with concerns for her left subclavian picc line and had noted chills.  Blood culture done and grew Klebsiella.  She was placed on cefepime and had refused removal of the line and admission with concern for being nearly last options.  She was put on 2 weeks of IV cefepime after phone curbside consultation by the ED with my partner and now has completed it.  No current fever or chills.  Had stopped her TPN but back on now for about 5 days.  No other complaints. Previous record reviewed in Epic and summarized above.   Past Medical History:  Diagnosis Date  . Abnormal LFTs 2013  . Allergic rhinosinusitis   . Anemia of chronic disease   . At risk for dental problems   . Atypical nevus   . Bacteremia due to Klebsiella pneumoniae 12/12/2011  . Bacterial overgrowth syndrome   . 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 Carotid stenosis    Carotid US (9/15):  R 40-59%; L 1-39% >> FU 1 year  . Congestive heart failure (CHF) (HCC)    EF 25-30%  . 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 Fracture of left clavicle   . History of blood transfusion 2013   anemia  . Hx of cardiovascular stress test    Myoview (9/15):  inf-apical scar; no ischemia; EF 47% - low risk   . 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 Osteoporosis   . Pancytopenia 10/07/2011  . Pathologic fracture of neck of femur ([place])   . Personal history of colonic polyps   . Renal insufficiency    hx of yrs ago  . Serratia marcescens infection -  bactermia assoc w/ PICC 01/18/2015  . 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 Splenomegaly    By ultrasound  . Thrombophilia ([place])   . Vitamin D deficiency     Prior to Admission medications   Medication Sig Start Date End Date Taking? Authorizing Provider  acetaminophen (TYLENOL) 500 MG tablet Take 1,000 mg by mouth daily as needed for mild pain.   Yes [provider]  ADULT TPN 1,800 mLs 4 (four) times a week. Pt receives home TPN from [facility]:  1800 mL bag, four nights weekly (Monday, Tuesday, Wednesday, Thursday for 8 hours (includes 1 hour taper up and down).   Yes [provider]  azithromycin (ZITHROMAX) 250 MG tablet Take 1 tablet (250 mg total) by mouth daily. Take first 2 tablets together, then 1 every day until finished. 05/28/18  Yes Hayden Rasmussen, MD  benzonatate (TESSALON) 100 MG capsule Take 1 capsule (100 mg total) by mouth every 8 (eight) hours. 05/28/18  Yes Hayden Rasmussen, MD  ceFEPIme (MAXIPIME) 2 g injection Inject 2 g into the vein every 8 (eight) hours. 12/08/18  Yes Gareth Morgan, MD  diphenhydrAMINE (BENADRYL) 25 mg capsule Take  25-50 mg by mouth every 6 (six) hours as needed for itching or sleep.    Yes [provider]  diphenoxylate-atropine (LOMOTIL) 2.5-0.025 MG tablet Take 1 tablet by mouth 4 (four) times daily as needed for diarrhea or loose stools. 03/24/16  Yes Gatha Mayer, MD  Multiple Vitamin (MULTIVITAMIN WITH MINERALS) TABS Take 1 tablet by mouth daily.   Yes [provider]  omeprazole (PRILOSEC) 40 MG capsule TAKE 1 CAPSULE 2 (TWO) TIMES DAILY BY MOUTH. TAKE BEFORE BREAKFAST AND SUPPER 09/14/18  Yes Gatha Mayer, MD  ondansetron (ZOFRAN-ODT) 8 MG disintegrating tablet Take 1 tablet (8 mg total) by mouth every 8 (eight) hours as needed for nausea or vomiting. 06/03/18  Yes Gatha Mayer, MD  PRESCRIPTION MEDICATION 5 mLs See admin instructions. 50 units Heparin  sodium flush   Yes [provider]  sodium chloride 0.9 % infusion Uses for TPN 10/27/17  Yes [provider]  Teduglutide, rDNA, (GATTEX) 5 MG KIT Inject 3.3 Units into the skin daily. 09/11/14  Yes Gatha Mayer, MD  VENTOLIN HFA 108 (90 Base) MCG/ACT inhaler TAKE 2 PUFFS BY MOUTH EVERY 6 HOURS AS NEEDED FOR WHEEZE OR SHORTNESS OF BREATH Patient taking differently: Inhale 2 puffs into the lungs every 6 (six) hours as needed for wheezing or shortness of breath.  06/15/18  Yes [name], MD  vitamin E 400 UNIT capsule Take 400 Units by mouth daily.    Yes [provider]  XARELTO 20 MG TABS tablet TAKE 1 TABLET (20 MG TOTAL) BY MOUTH DAILY WITH SUPPER. Patient taking differently: Take 20 mg by mouth daily with supper.  09/13/18  Yes [name], MD    Allergies  Allergen Reactions  . Penicillins Itching, Swelling and Other (See Comments)    Reaction:  Facial swelling Has patient had a PCN reaction causing immediate rash, facial/tongue/throat swelling, SOB or lightheadedness with hypotension: Yes Has patient had a PCN reaction causing severe rash involving mucus membranes or skin necrosis: No Has patient had a PCN reaction that required hospitalization No Has patient had a PCN reaction occurring within the last 10 years: No If all of the above answers are "NO", then may proceed with Cephalosporin use.    Social History   Tobacco Use  . Smoking status: Former Smoker    Types: Cigarettes    Quit date: 05/12/2004    Years since quitting: 14.6  . Smokeless tobacco: Never Used  Substance Use Topics  . Alcohol use: No  . Drug use: No    Family History  Problem Relation Age of Onset  . Diabetes Mother   . Hypertension Mother   . AAA (abdominal aortic aneurysm) Mother   . Colon cancer Neg Hx   . Stomach cancer Neg Hx     Review of Systems  Constitutional: negative for fevers, chills, malaise Integument/breast: rash around the picc site All  other systems reviewed and are negative    Constitutional: in no apparent distress  EYES: anicteric Cardiovascular: Cor RRR Respiratory: normal respiratory effort Musculoskeletal: no pedal edema noted Skin: rash around picc site Neuro: non-focal Psych: normal affect  Labs: Lab Results  Component Value Date   WBC 8.4 12/08/2018   HGB 11.2 (L) 12/08/2018   HCT 36.0 12/08/2018   MCV 102.6 (H) 12/08/2018   PLT 99 (L) 12/08/2018    Lab Results  Component Value Date   CREATININE 1.11 (H) 12/08/2018   BUN 17 12/08/2018   NA  140 12/08/2018   K 3.1 (L) 12/08/2018   CL 112 (H) 12/08/2018   CO2 19 (L) 12/08/2018    Lab Results  Component Value Date   ALT 15 12/08/2018   AST 17 12/08/2018   ALKPHOS 89 12/08/2018   BILITOT 0.4 12/08/2018   INR 1.8 (H) 02/08/2018     Assessment: Line infection with Klebsiella, s/p treatment.  I discussed with her that it is possible with a gram negative organism to treat through with IV antibiotics as we did and salvage the line.  I feel this is a reasonable approach, particularly with her history and being line dependent.  I would like to repeat her blood cultures though and if positive with the same organism would then need to consider removal.     Plan: 1) repeat blood cultures in about 5-7 days - will have them added on to lab work done by Dr. Carlean Purl 2) return precautions with fever, chills and no other source or if blood cultures positive again, particularly with Klebsiella Otherwise PRN

## 2019-01-03 ENCOUNTER — Telehealth: Payer: Self-pay

## 2019-01-03 ENCOUNTER — Other Ambulatory Visit (INDEPENDENT_AMBULATORY_CARE_PROVIDER_SITE_OTHER): Payer: Medicare Other

## 2019-01-03 DIAGNOSIS — K912 Postsurgical malabsorption, not elsewhere classified: Secondary | ICD-10-CM | POA: Diagnosis not present

## 2019-01-03 LAB — CBC WITH DIFFERENTIAL/PLATELET
Basophils Absolute: 0.1 10*3/uL (ref 0.0–0.1)
Basophils Relative: 1.1 % (ref 0.0–3.0)
Eosinophils Absolute: 0.9 10*3/uL — ABNORMAL HIGH (ref 0.0–0.7)
Eosinophils Relative: 12.3 % — ABNORMAL HIGH (ref 0.0–5.0)
HCT: 35.7 % — ABNORMAL LOW (ref 36.0–46.0)
Hemoglobin: 11.7 g/dL — ABNORMAL LOW (ref 12.0–15.0)
Lymphocytes Relative: 30.3 % (ref 12.0–46.0)
Lymphs Abs: 2.1 10*3/uL (ref 0.7–4.0)
MCHC: 32.7 g/dL (ref 30.0–36.0)
MCV: 98.1 fl (ref 78.0–100.0)
Monocytes Absolute: 0.8 10*3/uL (ref 0.1–1.0)
Monocytes Relative: 11.5 % (ref 3.0–12.0)
Neutro Abs: 3.1 10*3/uL (ref 1.4–7.7)
Neutrophils Relative %: 44.8 % (ref 43.0–77.0)
Platelets: 139 10*3/uL — ABNORMAL LOW (ref 150.0–400.0)
RBC: 3.64 Mil/uL — ABNORMAL LOW (ref 3.87–5.11)
RDW: 14.7 % (ref 11.5–15.5)
WBC: 7 10*3/uL (ref 4.0–10.5)

## 2019-01-03 LAB — C-REACTIVE PROTEIN: CRP: <1 mg/dL (ref 0.5–20.0)

## 2019-01-03 LAB — COMPREHENSIVE METABOLIC PANEL
ALT: 23 U/L (ref 0–35)
AST: 23 U/L (ref 0–37)
Albumin: 3.8 g/dL (ref 3.5–5.2)
Alkaline Phosphatase: 92 U/L (ref 39–117)
BUN: 12 mg/dL (ref 6–23)
CO2: 20 mEq/L (ref 19–32)
Calcium: 8.9 mg/dL (ref 8.4–10.5)
Chloride: 108 mEq/L (ref 96–112)
Creatinine, Ser: 0.93 mg/dL (ref 0.40–1.20)
GFR: 59.83 mL/min — ABNORMAL LOW (ref >60.00)
Glucose, Bld: 86 mg/dL (ref 70–99)
Potassium: 3.6 mEq/L (ref 3.5–5.1)
Sodium: 137 mEq/L (ref 135–145)
Total Bilirubin: 0.5 mg/dL (ref 0.2–1.2)
Total Protein: 8 g/dL (ref 6.0–8.3)

## 2019-01-03 LAB — MAGNESIUM: Magnesium: 1.6 mg/dL (ref 1.5–2.5)

## 2019-01-03 LAB — PHOSPHORUS: Phosphorus: 4.1 mg/dL (ref 2.3–4.6)

## 2019-01-03 MED ORDER — ONDANSETRON 8 MG PO TBDP: 8.0000 mg | ORAL_TABLET | Freq: Three times a day (TID) | ORAL | 11 refills | Status: DC | PRN

## 2019-01-03 NOTE — Telephone Encounter (Signed)
Refill x 1 year 

## 2019-01-03 NOTE — Telephone Encounter (Signed)
Ondansetron refilled

## 2019-01-03 NOTE — Telephone Encounter (Signed)
Pharmacy requesting a refill on patient's ondansetron 8mg  tablets, please advise Sir, thank you. Uses PRN.

## 2019-01-04 LAB — PREALBUMIN: Prealbumin: 19 mg/dL (ref 17–34)

## 2019-01-13 ENCOUNTER — Other Ambulatory Visit: Payer: Self-pay | Admitting: Internal Medicine

## 2019-01-13 DIAGNOSIS — T80219D Unspecified infection due to central venous catheter, subsequent encounter: Secondary | ICD-10-CM

## 2019-01-18 ENCOUNTER — Telehealth: Payer: Self-pay | Admitting: Family Medicine

## 2019-01-18 ENCOUNTER — Other Ambulatory Visit: Payer: Medicare Other

## 2019-01-18 DIAGNOSIS — T80219D Unspecified infection due to central venous catheter, subsequent encounter: Secondary | ICD-10-CM

## 2019-01-18 NOTE — Telephone Encounter (Signed)
Pt called and wants to schedule flu shot and pneumonia shot and any other shots she might be do for, I know the Flu shot is ok to schedule but I wasn't sure what else she might need and I didn't know if if the pneumonia shot needs to be ordered or not, Is this ok to schedule and what else is she do for?

## 2019-01-18 NOTE — Telephone Encounter (Signed)
Should repeat pneumovax since first pneumovax was before age 68

## 2019-01-18 NOTE — Telephone Encounter (Signed)
Please advise pt had a pneumovax in 2010 and prevnar in 2018. Should she repeat Pneumovax?

## 2019-01-19 NOTE — Telephone Encounter (Signed)
Patient has been scheduled for flu shot and pneumovax 23

## 2019-01-26 ENCOUNTER — Ambulatory Visit (INDEPENDENT_AMBULATORY_CARE_PROVIDER_SITE_OTHER): Payer: Medicare Other

## 2019-01-26 ENCOUNTER — Other Ambulatory Visit: Payer: Self-pay

## 2019-01-26 DIAGNOSIS — Z23 Encounter for immunization: Secondary | ICD-10-CM | POA: Diagnosis not present

## 2019-01-26 LAB — CULTURE BLOOD MANUAL
Micro Number: 856881
Micro Number: 856903
Result: NO GROWTH
Result: NO GROWTH
Specimen Quality: ADEQUATE
Specimen Quality: ADEQUATE

## 2019-01-26 NOTE — Progress Notes (Addendum)
Jaqueline Scaletta presents to office today for annual flu and pneumonia vaccine per Annye Asa, MD. Administered PNEUMOVAX-23 0.5 mL IM right arm. Patient tolerated well.   Above order is mine.  Annye Asa, MD

## 2019-01-26 NOTE — Progress Notes (Signed)
[name] [name],  The blood cultures are ok - no bacteria growing.  [name]!  We should have visit so please make an appointment to see me.  [name]

## 2019-01-26 NOTE — Addendum Note (Signed)
Addended by: [name] on: 01/26/2019 03:23 PM   Modules accepted: Level of Service

## 2019-02-01 ENCOUNTER — Other Ambulatory Visit: Payer: Self-pay

## 2019-02-01 ENCOUNTER — Encounter: Payer: Self-pay | Admitting: Internal Medicine

## 2019-02-01 ENCOUNTER — Ambulatory Visit (INDEPENDENT_AMBULATORY_CARE_PROVIDER_SITE_OTHER): Payer: Medicare Other | Admitting: Internal Medicine

## 2019-02-01 DIAGNOSIS — K912 Postsurgical malabsorption, not elsewhere classified: Secondary | ICD-10-CM

## 2019-02-01 DIAGNOSIS — K90829 Short bowel syndrome, unspecified: Secondary | ICD-10-CM

## 2019-02-01 DIAGNOSIS — R7402 Elevation of levels of lactic acid dehydrogenase (LDH): Secondary | ICD-10-CM

## 2019-02-01 DIAGNOSIS — D6859 Other primary thrombophilia: Secondary | ICD-10-CM

## 2019-02-01 DIAGNOSIS — R740 Nonspecific elevation of levels of transaminase and lactic acid dehydrogenase [LDH]: Secondary | ICD-10-CM | POA: Diagnosis not present

## 2019-02-01 DIAGNOSIS — R161 Splenomegaly, not elsewhere classified: Secondary | ICD-10-CM | POA: Diagnosis not present

## 2019-02-01 DIAGNOSIS — T80219D Unspecified infection due to central venous catheter, subsequent encounter: Secondary | ICD-10-CM

## 2019-02-01 NOTE — Assessment & Plan Note (Addendum)
stable, weight is down some but I think that is multifactorial she has leveled. Continue TPN for of 7 nights. Stay on Gattex Continue to coordinate care with the help of the pharmacists

## 2019-02-01 NOTE — Patient Instructions (Signed)
You have been [facility] on [facility]/2019 at 10:30am. Please arrive 20 minutes prior to your appointment for registration. Make certain not to have anything to eat or drink after midnight prior to your appointment. Should you need to reschedule your appointment, please contact [phone]  . This test typically takes about 30 minutes to perform.  Please follow up in 6 months

## 2019-02-01 NOTE — Assessment & Plan Note (Addendum)
[place] to follow-up.  She could have some portal hypertension.

## 2019-02-01 NOTE — Progress Notes (Signed)
[name] [name] 67 y.o. 03-Oct-1950 [id number]  Assessment & Plan:   Acquired short bowel syndrome stable, weight is down some but I think that is multifactorial she has leveled. Continue TPN for of 7 nights. Stay on Gattex Continue to coordinate care with the help of the pharmacists   Splenomegaly [place] to follow-up.  She could have some portal hypertension.  TRANSAMINASES, SERUM, ELEVATED USTo evaluate liver.  At one point years ago there was a question of early cirrhosis.  She is at risk.  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 CC: [name] Minium, MD    Subjective:   Chief Complaint: Follow-up of short bowel syndrome recent PICC line infection  HPI [name] [name] reports that she is feeling well.  Her weight is down some from the beginning of the year but she has leveled off.  She was off TPN for a period of time when she had her recent PICC line infection,With Klebsiella pneumonia in the blood.  She elected not to have her PICC line withdrawn.  This was managed through ID at home, thanks to Dr. De Burrs and subsequent cultures were negative.  She does not take cyclic antibiotics anymore she seems to manage the diarrhea well with ondansetron and since being on Gattex it is overall better.   Wt Readings from Last 3 Encounters:  02/01/19 140 lb (63.5 kg)  12/07/18 139 lb (63 kg)  05/28/18 147 lb (66.7 kg)     Allergies  Allergen Reactions  . Penicillins Itching, Swelling and Other (See Comments)    Reaction:  Facial swelling Has patient had a PCN reaction causing immediate rash, facial/tongue/throat swelling, SOB or lightheadedness with hypotension: Yes Has patient had a PCN reaction causing severe rash involving mucus membranes or skin necrosis: No Has patient had a PCN reaction that required hospitalization No Has patient had a PCN reaction occurring within the last 10  years: No If all of the above answers are "NO", then may proceed with Cephalosporin use.   Current Meds  Medication Sig  . acetaminophen (TYLENOL) 500 MG tablet Take 1,000 mg by mouth daily as needed for mild pain.  . ADULT TPN 1,800 mLs 4 (four) times a week. Pt receives home TPN from [facility]:  1800 mL bag, four nights weekly (Monday, Tuesday, Wednesday, Thursday for 8 hours (includes 1 hour taper up and down).  . benzonatate (TESSALON) 100 MG capsule Take 1 capsule (100 mg total) by mouth every 8 (eight) hours.  Marland Kitchen ceFEPIme (MAXIPIME) 2 g injection Inject 2 g into the vein every 8 (eight) hours.  . clobetasol ointment (TEMOVATE) 7.82 % Apply 1 application topically 2 (two) times daily.  . diphenhydrAMINE (BENADRYL) 25 mg capsule Take 25-50 mg by mouth every 6 (six) hours as needed for itching or sleep.   . diphenoxylate-atropine (LOMOTIL) 2.5-0.025 MG tablet Take 1 tablet by mouth 4 (four) times daily as needed for diarrhea or loose stools.  . Multiple Vitamin (MULTIVITAMIN WITH MINERALS) TABS Take 1 tablet by mouth daily.  Marland Kitchen omeprazole (PRILOSEC) 40 MG capsule TAKE 1 CAPSULE 2 (TWO) TIMES DAILY BY MOUTH. TAKE BEFORE BREAKFAST AND SUPPER  . ondansetron (ZOFRAN-ODT) 8 MG disintegrating tablet Take 1 tablet (8 mg total) by mouth every 8 (eight) hours as needed for nausea or vomiting.  Marland Kitchen PRESCRIPTION MEDICATION 5 mLs See admin instructions. 50 units Heparin sodium flush  . sodium chloride 0.9 % infusion  Uses for TPN  . Teduglutide, rDNA, (GATTEX) 5 MG KIT Inject 3.3 Units into the skin daily.  . VENTOLIN HFA 108 (90 Base) MCG/ACT inhaler TAKE 2 PUFFS BY MOUTH EVERY 6 HOURS AS NEEDED FOR WHEEZE OR SHORTNESS OF BREATH (Patient taking differently: Inhale 2 puffs into the lungs every 6 (six) hours as needed for wheezing or shortness of breath. )  . vitamin E 400 UNIT capsule Take 400 Units by mouth daily.   [name] Reasons 20 MG TABS tablet TAKE 1 TABLET (20 MG TOTAL) BY MOUTH DAILY WITH SUPPER. (Patient  taking differently: Take 20 mg by mouth daily with supper. )   Past Medical History:  Diagnosis Date  . Abnormal LFTs 2013  . Allergic rhinosinusitis   . Anemia of chronic disease   . At risk for dental problems   . Atypical nevus   . Bacteremia due to Klebsiella pneumoniae 12/12/2011  . Bacterial overgrowth syndrome   . 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 Carotid stenosis    Carotid US (9/15):  R 40-59%; L 1-39% >> FU 1 year  . Closed left subtrochanteric femur fracture ([place]) 12/31/2017  . Congestive heart failure (CHF) (HCC)    EF 25-30%  . 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 Fracture of left clavicle   . History of blood transfusion 2013   anemia  . Hx of cardiovascular stress test    Myoview (9/15):  inf-apical scar; no ischemia; EF 47% - low risk   . 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 Osteoporosis   . Pancytopenia 10/07/2011  . Pathologic fracture of neck of femur ([place])   . Personal history of colonic polyps   . Renal insufficiency    hx of yrs ago  . Serratia marcescens infection - bactermia assoc w/ PICC 01/18/2015  . 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 Splenomegaly    By ultrasound  . Thrombophilia ([place])   . Vitamin D deficiency    Past Surgical History:  Procedure Laterality Date  . APPENDECTOMY  yrs ago  . CHOLECYSTECTOMY  yrs ago  . COLONOSCOPY  12/05/2005   internal hemorrhoids (for polyp surveillance)  . COLONOSCOPY  04/26/2012   Procedure: COLONOSCOPY;  Surgeon: Gatha Mayer, MD;  Location: [facility];  Service: Endoscopy;  Laterality: N/A;  . COLONOSCOPY WITH PROPOFOL N/A 03/18/2016   Procedure: COLONOSCOPY WITH PROPOFOL;  Surgeon: Gatha Mayer, MD;  Location: [facility];  Service: Endoscopy;  Laterality: N/A;  . ESOPHAGOGASTRODUODENOSCOPY  01/22/2009   erosive esophagitis  . HARDWARE REMOVAL Left 12/31/2017   Procedure:  HARDWARE REMOVAL;  Surgeon: Rod Can, MD;  Location: [facility];  Service: Orthopedics;  Laterality: Left;  . INTRAMEDULLARY (IM) NAIL INTERTROCHANTERIC Left 12/31/2017   Procedure: INTRAMEDULLARY (IM) NAIL SUBTROCHANTRIC;  Surgeon: Rod Can, MD;  Location: [facility];  Service: Orthopedics;  Laterality: Left;  . IR CV LINE INJECTION  03/23/2018  . IR FLUORO GUIDE CV LINE LEFT  01/01/2018  . IR FLUORO GUIDE CV LINE LEFT  03/24/2018  . IR FLUORO GUIDE CV LINE LEFT  05/21/2018  . IR FLUORO GUIDE CV LINE LEFT  07/09/2018  . IR GENERIC HISTORICAL  07/04/2016   IR REMOVAL TUN CV CATH W/O FL 07/04/2016 Ascencion Dike, PA-C [facility]  . IR GENERIC HISTORICAL  07/10/2016   IR US GUIDE VASC ACCESS LEFT 07/10/2016 Arne [place],  MD [facility]  . IR GENERIC HISTORICAL  07/10/2016   IR FLUORO GUIDE CV LINE LEFT 07/10/2016 Arne [place], MD [facility]  . IR PATIENT EVAL TECH 0-60 MINS  10/07/2017  . IR PATIENT EVAL TECH 0-60 MINS  03/09/2018  . IR RADIOLOGIST EVAL & MGMT  07/21/2017  . ORIF PROXIMAL FEMORAL FRACTURE W/ ITST NAIL SYSTEM  03/2007   left, Dr. Shellia Carwin  . 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   Review of Systems As above  Objective:   Physical Exam BP 100/60   Pulse 71   Temp (!) 97.4 F (36.3 C)   Ht '5\' 8"'$  (1.727 m)   Wt 140 lb (63.5 kg)   LMP 02/26/2014   BMI 21.29 kg/m \ Chronically ill no acute distress Eyes anicteric Lungs clear Normal heart sounds Abdomen is soft, she has well-healed surgical scars  and slight firmness in the right lower quadrant as before I detect no hepatosplenomegaly or mass Extremities are without cyanosis clubbing or edema She is her usual pleasant self and alert and oriented x3  Reviewed labs imaging ID notes from the last several months

## 2019-02-01 NOTE — Assessment & Plan Note (Signed)
resolved 

## 2019-02-01 NOTE — Assessment & Plan Note (Signed)
Stay on Xarelto

## 2019-02-01 NOTE — Assessment & Plan Note (Addendum)
USTo evaluate liver.  At one point years ago there was a question of early cirrhosis.  She is at risk.

## 2019-02-04 ENCOUNTER — Ambulatory Visit
Admission: RE | Admit: 2019-02-04 | Discharge: 2019-02-04 | Disposition: A | Discharge status: Home / Self Care | Payer: Medicare Other | Source: Ambulatory Visit | Attending: Internal Medicine | Admitting: Internal Medicine

## 2019-02-04 DIAGNOSIS — R161 Splenomegaly, not elsewhere classified: Secondary | ICD-10-CM

## 2019-02-04 DIAGNOSIS — K912 Postsurgical malabsorption, not elsewhere classified: Secondary | ICD-10-CM

## 2019-02-04 DIAGNOSIS — T80219D Unspecified infection due to central venous catheter, subsequent encounter: Secondary | ICD-10-CM

## 2019-02-04 DIAGNOSIS — R7401 Elevation of levels of liver transaminase levels: Secondary | ICD-10-CM

## 2019-02-04 DIAGNOSIS — R7402 Elevation of levels of lactic acid dehydrogenase (LDH): Secondary | ICD-10-CM

## 2019-02-09 NOTE — Progress Notes (Signed)
Analyse,  The spleen remains enlarged - the liver does not appear to have cirrhosis (good news!)  Nothing further now.  Should see you in about 6 mos (March/Keegan).  Best regards,  Gatha Mayer, MD, [facility]

## 2019-02-14 ENCOUNTER — Other Ambulatory Visit (INDEPENDENT_AMBULATORY_CARE_PROVIDER_SITE_OTHER): Payer: Medicare Other

## 2019-02-14 DIAGNOSIS — K912 Postsurgical malabsorption, not elsewhere classified: Secondary | ICD-10-CM

## 2019-02-14 LAB — CBC WITH DIFFERENTIAL/PLATELET
Basophils Absolute: 0.1 10*3/uL (ref 0.0–0.1)
Basophils Relative: 1.4 % (ref 0.0–3.0)
Eosinophils Absolute: 0.3 10*3/uL (ref 0.0–0.7)
Eosinophils Relative: 6.8 % — ABNORMAL HIGH (ref 0.0–5.0)
HCT: 35.1 % — ABNORMAL LOW (ref 36.0–46.0)
Hemoglobin: 11.3 g/dL — ABNORMAL LOW (ref 12.0–15.0)
Lymphocytes Relative: 37.6 % (ref 12.0–46.0)
Lymphs Abs: 1.9 10*3/uL (ref 0.7–4.0)
MCHC: 32.2 g/dL (ref 30.0–36.0)
MCV: 97.5 fl (ref 78.0–100.0)
Monocytes Absolute: 0.6 10*3/uL (ref 0.1–1.0)
Monocytes Relative: 12.6 % — ABNORMAL HIGH (ref 3.0–12.0)
Neutro Abs: 2.1 10*3/uL (ref 1.4–7.7)
Neutrophils Relative %: 41.6 % — ABNORMAL LOW (ref 43.0–77.0)
Platelets: 136 10*3/uL — ABNORMAL LOW (ref 150.0–400.0)
RBC: 3.6 Mil/uL — ABNORMAL LOW (ref 3.87–5.11)
RDW: 15.6 % — ABNORMAL HIGH (ref 11.5–15.5)
WBC: 5.1 10*3/uL (ref 4.0–10.5)

## 2019-02-14 LAB — MAGNESIUM: Magnesium: 1.6 mg/dL (ref 1.5–2.5)

## 2019-02-14 LAB — COMPREHENSIVE METABOLIC PANEL
ALT: 21 U/L (ref 0–35)
AST: 19 U/L (ref 0–37)
Albumin: 3.5 g/dL (ref 3.5–5.2)
Alkaline Phosphatase: 94 U/L (ref 39–117)
BUN: 13 mg/dL (ref 6–23)
CO2: 21 mEq/L (ref 19–32)
Calcium: 8.8 mg/dL (ref 8.4–10.5)
Chloride: 109 mEq/L (ref 96–112)
Creatinine, Ser: 0.95 mg/dL (ref 0.40–1.20)
GFR: 58.36 mL/min — ABNORMAL LOW (ref >60.00)
Glucose, Bld: 79 mg/dL (ref 70–99)
Potassium: 3.6 mEq/L (ref 3.5–5.1)
Sodium: 137 mEq/L (ref 135–145)
Total Bilirubin: 0.6 mg/dL (ref 0.2–1.2)
Total Protein: 7.6 g/dL (ref 6.0–8.3)

## 2019-02-14 LAB — PHOSPHORUS: Phosphorus: 4 mg/dL (ref 2.3–4.6)

## 2019-02-14 LAB — C-REACTIVE PROTEIN: CRP: <1 mg/dL (ref 0.5–20.0)

## 2019-02-15 LAB — PREALBUMIN: Prealbumin: 16 mg/dL — ABNORMAL LOW (ref 17–34)

## 2019-02-16 ENCOUNTER — Encounter: Payer: Self-pay | Admitting: Gynecology

## 2019-02-21 ENCOUNTER — Other Ambulatory Visit: Payer: Self-pay

## 2019-02-21 MED ORDER — OMEPRAZOLE 40 MG PO CPDR: DELAYED_RELEASE_CAPSULE | ORAL | 1 refills | Status: DC

## 2019-02-21 NOTE — Telephone Encounter (Signed)
Omeprazole refilled as pharmacy requested.  

## 2019-03-14 ENCOUNTER — Other Ambulatory Visit (INDEPENDENT_AMBULATORY_CARE_PROVIDER_SITE_OTHER): Payer: Medicare Other

## 2019-03-14 DIAGNOSIS — K912 Postsurgical malabsorption, not elsewhere classified: Secondary | ICD-10-CM | POA: Diagnosis not present

## 2019-03-14 LAB — CBC WITH DIFFERENTIAL/PLATELET
Basophils Absolute: 0.1 10*3/uL (ref 0.0–0.1)
Basophils Relative: 1.1 % (ref 0.0–3.0)
Eosinophils Absolute: 0.4 10*3/uL (ref 0.0–0.7)
Eosinophils Relative: 7.8 % — ABNORMAL HIGH (ref 0.0–5.0)
HCT: 33.3 % — ABNORMAL LOW (ref 36.0–46.0)
Hemoglobin: 11 g/dL — ABNORMAL LOW (ref 12.0–15.0)
Lymphocytes Relative: 36.9 % (ref 12.0–46.0)
Lymphs Abs: 1.8 10*3/uL (ref 0.7–4.0)
MCHC: 33 g/dL (ref 30.0–36.0)
MCV: 97.2 fl (ref 78.0–100.0)
Monocytes Absolute: 0.6 10*3/uL (ref 0.1–1.0)
Monocytes Relative: 11.7 % (ref 3.0–12.0)
Neutro Abs: 2.1 10*3/uL (ref 1.4–7.7)
Neutrophils Relative %: 42.5 % — ABNORMAL LOW (ref 43.0–77.0)
Platelets: 123 10*3/uL — ABNORMAL LOW (ref 150.0–400.0)
RBC: 3.43 Mil/uL — ABNORMAL LOW (ref 3.87–5.11)
RDW: 15.3 % (ref 11.5–15.5)
WBC: 5 10*3/uL (ref 4.0–10.5)

## 2019-03-14 LAB — COMPREHENSIVE METABOLIC PANEL
ALT: 17 U/L (ref 0–35)
AST: 18 U/L (ref 0–37)
Albumin: 3.4 g/dL — ABNORMAL LOW (ref 3.5–5.2)
Alkaline Phosphatase: 84 U/L (ref 39–117)
BUN: 16 mg/dL (ref 6–23)
CO2: 23 mEq/L (ref 19–32)
Calcium: 8.7 mg/dL (ref 8.4–10.5)
Chloride: 108 mEq/L (ref 96–112)
Creatinine, Ser: 0.88 mg/dL (ref 0.40–1.20)
GFR: 63.74 mL/min (ref >60.00)
Glucose, Bld: 76 mg/dL (ref 70–99)
Potassium: 3.7 mEq/L (ref 3.5–5.1)
Sodium: 138 mEq/L (ref 135–145)
Total Bilirubin: 0.5 mg/dL (ref 0.2–1.2)
Total Protein: 7.4 g/dL (ref 6.0–8.3)

## 2019-03-14 LAB — MAGNESIUM: Magnesium: 1.6 mg/dL (ref 1.5–2.5)

## 2019-03-14 LAB — C-REACTIVE PROTEIN: CRP: <1 mg/dL (ref 0.5–20.0)

## 2019-03-14 LAB — PHOSPHORUS: Phosphorus: 3.7 mg/dL (ref 2.3–4.6)

## 2019-03-15 LAB — PREALBUMIN: Prealbumin: 16 mg/dL — ABNORMAL LOW (ref 17–34)

## 2019-03-15 NOTE — Progress Notes (Signed)
Mild stable abnormalities or normal

## 2019-04-18 ENCOUNTER — Other Ambulatory Visit (INDEPENDENT_AMBULATORY_CARE_PROVIDER_SITE_OTHER): Payer: Medicare Other

## 2019-04-18 DIAGNOSIS — K912 Postsurgical malabsorption, not elsewhere classified: Secondary | ICD-10-CM | POA: Diagnosis not present

## 2019-04-18 LAB — CBC WITH DIFFERENTIAL/PLATELET
Basophils Absolute: 0 10*3/uL (ref 0.0–0.1)
Basophils Relative: 0.8 % (ref 0.0–3.0)
Eosinophils Absolute: 0.3 10*3/uL (ref 0.0–0.7)
Eosinophils Relative: 6.7 % — ABNORMAL HIGH (ref 0.0–5.0)
HCT: 35.1 % — ABNORMAL LOW (ref 36.0–46.0)
Hemoglobin: 11.2 g/dL — ABNORMAL LOW (ref 12.0–15.0)
Lymphocytes Relative: 34.8 % (ref 12.0–46.0)
Lymphs Abs: 1.7 10*3/uL (ref 0.7–4.0)
MCHC: 32 g/dL (ref 30.0–36.0)
MCV: 97.4 fl (ref 78.0–100.0)
Monocytes Absolute: 0.6 10*3/uL (ref 0.1–1.0)
Monocytes Relative: 12.9 % — ABNORMAL HIGH (ref 3.0–12.0)
Neutro Abs: 2.2 10*3/uL (ref 1.4–7.7)
Neutrophils Relative %: 44.8 % (ref 43.0–77.0)
Platelets: 133 10*3/uL — ABNORMAL LOW (ref 150.0–400.0)
RBC: 3.6 Mil/uL — ABNORMAL LOW (ref 3.87–5.11)
RDW: 15.1 % (ref 11.5–15.5)
WBC: 4.9 10*3/uL (ref 4.0–10.5)

## 2019-04-18 LAB — COMPREHENSIVE METABOLIC PANEL
ALT: 17 U/L (ref 0–35)
AST: 18 U/L (ref 0–37)
Albumin: 3.5 g/dL (ref 3.5–5.2)
Alkaline Phosphatase: 93 U/L (ref 39–117)
BUN: 12 mg/dL (ref 6–23)
CO2: 22 mEq/L (ref 19–32)
Calcium: 8.5 mg/dL (ref 8.4–10.5)
Chloride: 108 mEq/L (ref 96–112)
Creatinine, Ser: 0.94 mg/dL (ref 0.40–1.20)
GFR: 59.05 mL/min — ABNORMAL LOW (ref >60.00)
Glucose, Bld: 79 mg/dL (ref 70–99)
Potassium: 3.6 mEq/L (ref 3.5–5.1)
Sodium: 137 mEq/L (ref 135–145)
Total Bilirubin: 0.6 mg/dL (ref 0.2–1.2)
Total Protein: 7.2 g/dL (ref 6.0–8.3)

## 2019-04-18 LAB — MAGNESIUM: Magnesium: 1.7 mg/dL (ref 1.5–2.5)

## 2019-04-18 LAB — PHOSPHORUS: Phosphorus: 3.8 mg/dL (ref 2.3–4.6)

## 2019-04-18 LAB — C-REACTIVE PROTEIN: CRP: <1 mg/dL (ref 0.5–20.0)

## 2019-04-19 LAB — PREALBUMIN: Prealbumin: 17 mg/dL (ref 17–34)

## 2019-05-25 ENCOUNTER — Other Ambulatory Visit: Payer: Self-pay | Admitting: Family Medicine

## 2019-06-01 ENCOUNTER — Encounter: Payer: Self-pay | Admitting: Family Medicine

## 2019-06-06 ENCOUNTER — Other Ambulatory Visit (INDEPENDENT_AMBULATORY_CARE_PROVIDER_SITE_OTHER): Payer: Medicare Other

## 2019-06-06 DIAGNOSIS — K912 Postsurgical malabsorption, not elsewhere classified: Secondary | ICD-10-CM

## 2019-06-06 LAB — COMPREHENSIVE METABOLIC PANEL
ALT: 24 U/L (ref 0–35)
AST: 18 U/L (ref 0–37)
Albumin: 3.4 g/dL — ABNORMAL LOW (ref 3.5–5.2)
Alkaline Phosphatase: 100 U/L (ref 39–117)
BUN: 15 mg/dL (ref 6–23)
CO2: 24 mEq/L (ref 19–32)
Calcium: 8.8 mg/dL (ref 8.4–10.5)
Chloride: 107 mEq/L (ref 96–112)
Creatinine, Ser: 0.97 mg/dL (ref 0.40–1.20)
GFR: 56.93 mL/min — ABNORMAL LOW (ref >60.00)
Glucose, Bld: 72 mg/dL (ref 70–99)
Potassium: 3.7 mEq/L (ref 3.5–5.1)
Sodium: 137 mEq/L (ref 135–145)
Total Bilirubin: 0.6 mg/dL (ref 0.2–1.2)
Total Protein: 7.6 g/dL (ref 6.0–8.3)

## 2019-06-06 LAB — CBC WITH DIFFERENTIAL/PLATELET
Basophils Absolute: 0.1 10*3/uL (ref 0.0–0.1)
Basophils Relative: 1.3 % (ref 0.0–3.0)
Eosinophils Absolute: 0.4 10*3/uL (ref 0.0–0.7)
Eosinophils Relative: 6.9 % — ABNORMAL HIGH (ref 0.0–5.0)
HCT: 33.9 % — ABNORMAL LOW (ref 36.0–46.0)
Hemoglobin: 11 g/dL — ABNORMAL LOW (ref 12.0–15.0)
Lymphocytes Relative: 33.7 % (ref 12.0–46.0)
Lymphs Abs: 1.8 10*3/uL (ref 0.7–4.0)
MCHC: 32.6 g/dL (ref 30.0–36.0)
MCV: 97.8 fl (ref 78.0–100.0)
Monocytes Absolute: 0.9 10*3/uL (ref 0.1–1.0)
Monocytes Relative: 16.6 % — ABNORMAL HIGH (ref 3.0–12.0)
Neutro Abs: 2.3 10*3/uL (ref 1.4–7.7)
Neutrophils Relative %: 41.5 % — ABNORMAL LOW (ref 43.0–77.0)
Platelets: 133 10*3/uL — ABNORMAL LOW (ref 150.0–400.0)
RBC: 3.47 Mil/uL — ABNORMAL LOW (ref 3.87–5.11)
RDW: 14.6 % (ref 11.5–15.5)
WBC: 5.5 10*3/uL (ref 4.0–10.5)

## 2019-06-06 LAB — MAGNESIUM: Magnesium: 1.7 mg/dL (ref 1.5–2.5)

## 2019-06-06 LAB — C-REACTIVE PROTEIN: CRP: <1 mg/dL (ref 0.5–20.0)

## 2019-06-06 LAB — PHOSPHORUS: Phosphorus: 3.7 mg/dL (ref 2.3–4.6)

## 2019-06-07 LAB — PREALBUMIN: Prealbumin: 16 mg/dL — ABNORMAL LOW (ref 17–34)

## 2019-06-21 ENCOUNTER — Telehealth: Payer: Self-pay | Admitting: *Deleted

## 2019-06-21 ENCOUNTER — Encounter: Payer: Self-pay | Admitting: *Deleted

## 2019-06-21 ENCOUNTER — Other Ambulatory Visit: Payer: Self-pay | Admitting: *Deleted

## 2019-06-21 DIAGNOSIS — E7142 Carnitine deficiency due to inborn errors of metabolism: Secondary | ICD-10-CM

## 2019-06-21 DIAGNOSIS — K912 Postsurgical malabsorption, not elsewhere classified: Secondary | ICD-10-CM

## 2019-06-21 DIAGNOSIS — K90829 Short bowel syndrome, unspecified: Secondary | ICD-10-CM

## 2019-06-21 NOTE — Telephone Encounter (Signed)
Notified the patient her orders were placed for micronutrient testing.

## 2019-07-04 ENCOUNTER — Other Ambulatory Visit: Payer: Self-pay | Admitting: Internal Medicine

## 2019-07-04 ENCOUNTER — Other Ambulatory Visit (INDEPENDENT_AMBULATORY_CARE_PROVIDER_SITE_OTHER): Payer: Medicare Other

## 2019-07-04 DIAGNOSIS — K912 Postsurgical malabsorption, not elsewhere classified: Secondary | ICD-10-CM | POA: Diagnosis not present

## 2019-07-04 DIAGNOSIS — E7142 Carnitine deficiency due to inborn errors of metabolism: Secondary | ICD-10-CM | POA: Diagnosis not present

## 2019-07-04 DIAGNOSIS — K90829 Short bowel syndrome, unspecified: Secondary | ICD-10-CM

## 2019-07-04 LAB — CBC WITH DIFFERENTIAL/PLATELET
Basophils Absolute: 0 10*3/uL (ref 0.0–0.1)
Basophils Relative: 0.8 % (ref 0.0–3.0)
Eosinophils Absolute: 0.4 10*3/uL (ref 0.0–0.7)
Eosinophils Relative: 5.8 % — ABNORMAL HIGH (ref 0.0–5.0)
HCT: 35.9 % — ABNORMAL LOW (ref 36.0–46.0)
Hemoglobin: 11.6 g/dL — ABNORMAL LOW (ref 12.0–15.0)
Lymphocytes Relative: 27.4 % (ref 12.0–46.0)
Lymphs Abs: 1.7 10*3/uL (ref 0.7–4.0)
MCHC: 32.4 g/dL (ref 30.0–36.0)
MCV: 99.2 fl (ref 78.0–100.0)
Monocytes Absolute: 0.6 10*3/uL (ref 0.1–1.0)
Monocytes Relative: 9.8 % (ref 3.0–12.0)
Neutro Abs: 3.4 10*3/uL (ref 1.4–7.7)
Neutrophils Relative %: 56.2 % (ref 43.0–77.0)
Platelets: 134 10*3/uL — ABNORMAL LOW (ref 150.0–400.0)
RBC: 3.62 Mil/uL — ABNORMAL LOW (ref 3.87–5.11)
RDW: 14.8 % (ref 11.5–15.5)
WBC: 6.1 10*3/uL (ref 4.0–10.5)

## 2019-07-04 LAB — VITAMIN B12: Vitamin B-12: 263 pg/mL (ref 211–911)

## 2019-07-04 LAB — C-REACTIVE PROTEIN: CRP: <1 mg/dL (ref 0.5–20.0)

## 2019-07-04 LAB — COMPREHENSIVE METABOLIC PANEL
ALT: 24 U/L (ref 0–35)
AST: 20 U/L (ref 0–37)
Albumin: 3.9 g/dL (ref 3.5–5.2)
Alkaline Phosphatase: 105 U/L (ref 39–117)
BUN: 13 mg/dL (ref 6–23)
CO2: 19 mEq/L (ref 19–32)
Calcium: 8.9 mg/dL (ref 8.4–10.5)
Chloride: 108 mEq/L (ref 96–112)
Creatinine, Ser: 1 mg/dL (ref 0.40–1.20)
GFR: 54.95 mL/min — ABNORMAL LOW (ref >60.00)
Glucose, Bld: 81 mg/dL (ref 70–99)
Potassium: 3.3 mEq/L — ABNORMAL LOW (ref 3.5–5.1)
Sodium: 138 mEq/L (ref 135–145)
Total Bilirubin: 0.5 mg/dL (ref 0.2–1.2)
Total Protein: 8 g/dL (ref 6.0–8.3)

## 2019-07-04 LAB — FERRITIN: Ferritin: 86.7 ng/mL (ref 10.0–291.0)

## 2019-07-04 LAB — PROTIME-INR
INR: 1.9 ratio — ABNORMAL HIGH (ref 0.8–1.0)
Prothrombin Time: 21.1 s — ABNORMAL HIGH (ref 9.6–13.1)

## 2019-07-04 LAB — PHOSPHORUS: Phosphorus: 3.7 mg/dL (ref 2.3–4.6)

## 2019-07-04 LAB — MAGNESIUM: Magnesium: 1.6 mg/dL (ref 1.5–2.5)

## 2019-07-04 LAB — VITAMIN D 25 HYDROXY (VIT D DEFICIENCY, FRACTURES): VITD: 10.39 ng/mL — ABNORMAL LOW (ref 30.00–100.00)

## 2019-07-12 LAB — FATTY ACID PANEL, ESSENTIAL (C12-C22), SERUM
Arachidic, C20:0: 11.89 umol/L — ABNORMAL LOW (ref 16.8–38.5)
Arachidonic, C20:4w6: 532.54 umol/L (ref 351.4–1057)
DHA, C22:6w3: 76.11 umol/L (ref 53.3–216.3)
DPA, C22:5w3: 60.63 umol/L (ref 19.9–77.6)
DPA, C22:5w6: 10 umol/L (ref 9.2–32.1)
DTA, C22:4w6: 20.96 umol/L (ref 9.1–44.6)
Docosenoic, C22:1: 5.05 umol/L — ABNORMAL LOW (ref 5.73–11.92)
EPA, C20:5w3: 33.11 umol/L (ref 12.4–123)
Hexadecenoic, C16:1w9: 97.36 umol/L — ABNORMAL HIGH (ref 19.82–59.9)
LAURIC,C12:0: 8.82 umol/L (ref 4.3–36)
Linoleic, C18:2w6: 4828.05 umol/L (ref 2653.4–613)
Mead, C20:3w9: 5.22 umol/L — ABNORMAL LOW (ref 10.3–41.3)
Myristic, C14:0: 123.01 umol/L (ref 39.4–258.2)
Nervonic, C24:1w9: 66.8 umol/L (ref 56.9–132.7)
Oleic, C18:1w9: 2374.44 umol/L (ref 872.4–4182)
Palmitic, C16:0: 2905.01 umol/L (ref 1671.1–460)
Palmitoleic, C16:1w7: 465.07 umol/L (ref 68.5–570.2)
Stearic, C18:0: 784.96 umol/L (ref 590.2–1377)
Total Fatty Acids: 13.14 umol/L (ref 7.93–18.34)
Total Monounsaturated: 3.31 umol/L (ref 1.45–4.95)
Total Polyunsaturated: 5.92 umol/L (ref 3.57–8.11)
Total Saturated: 3.91 umol/L (ref 2.5–6.4)
Total w3: 0.34 umol/L (ref 0.1–0.5)
Total w6: 5.55 umol/L (ref 3.3–7.1)
Triene Tetraene Ratio: 0.01 — ABNORMAL LOW (ref 0.02–0.05)
Vaccenic, C18:1w7: 274.63 umol/L — ABNORMAL HIGH (ref 84.8–260.8)
a Linolenic, C18:3w3: 172.19 umol/L — ABNORMAL HIGH (ref 26.1–150.1)
g Linolenic, C18:3w6: 54.32 umol/L (ref 14.5–144.9)
h g-Linolenic, C20:3w6: 106.45 umol/L (ref 70–319.9)

## 2019-07-12 LAB — CHROMIUM, SERUM: Chromium, Serum: 0.8 mcg/L (ref <=1.4)

## 2019-07-12 LAB — PREALBUMIN: Prealbumin: 18 mg/dL (ref 17–34)

## 2019-07-12 LAB — COPPER, SERUM: Copper: 76 ug/dL (ref 70–175)

## 2019-07-12 LAB — VITAMIN E
Gamma-Tocopherol (Vit E): 4 mg/L (ref <=4.3)
Vitamin E (Alpha Tocopherol): 7 mg/L (ref 5.7–19.9)

## 2019-07-12 LAB — CARNITINE, LC/MS/MS
Carnitine, Esters: 27 umol/L — ABNORMAL HIGH (ref 4–13)
Carnitine, Free: 41 umol/L (ref 19–48)
Carnitine, Total: 68 umol/L — ABNORMAL HIGH (ref 25–58)
Esterifield/Free Ratio: 0.66 — ABNORMAL HIGH (ref 0.13–0.42)

## 2019-07-12 LAB — MANGANESE: Manganese, Blood: 0.6 mcg/L (ref <1.2)

## 2019-07-12 LAB — CERULOPLASMIN: Ceruloplasmin: 28 mg/dL (ref 18–53)

## 2019-07-12 LAB — ZINC: Zinc: 89 ug/dL (ref 60–130)

## 2019-07-12 LAB — VITAMIN A: Vitamin A (Retinoic Acid): 32 ug/dL — ABNORMAL LOW (ref 38–98)

## 2019-07-12 LAB — SELENIUM SERUM: Selenium, Blood: 73 mcg/L (ref 63–160)

## 2019-08-08 ENCOUNTER — Other Ambulatory Visit (INDEPENDENT_AMBULATORY_CARE_PROVIDER_SITE_OTHER): Payer: Medicare Other

## 2019-08-08 DIAGNOSIS — K912 Postsurgical malabsorption, not elsewhere classified: Secondary | ICD-10-CM | POA: Diagnosis not present

## 2019-08-08 LAB — COMPREHENSIVE METABOLIC PANEL
ALT: 18 U/L (ref 0–35)
AST: 18 U/L (ref 0–37)
Albumin: 3.5 g/dL (ref 3.5–5.2)
Alkaline Phosphatase: 102 U/L (ref 39–117)
BUN: 12 mg/dL (ref 6–23)
CO2: 20 mEq/L (ref 19–32)
Calcium: 8.7 mg/dL (ref 8.4–10.5)
Chloride: 109 mEq/L (ref 96–112)
Creatinine, Ser: 0.96 mg/dL (ref 0.40–1.20)
GFR: 57.58 mL/min — ABNORMAL LOW (ref >60.00)
Glucose, Bld: 82 mg/dL (ref 70–99)
Potassium: 3.3 mEq/L — ABNORMAL LOW (ref 3.5–5.1)
Sodium: 136 mEq/L (ref 135–145)
Total Bilirubin: 0.5 mg/dL (ref 0.2–1.2)
Total Protein: 7.6 g/dL (ref 6.0–8.3)

## 2019-08-08 LAB — CBC WITH DIFFERENTIAL/PLATELET
Basophils Absolute: 0.1 10*3/uL (ref 0.0–0.1)
Basophils Relative: 1.3 % (ref 0.0–3.0)
Eosinophils Absolute: 0.3 10*3/uL (ref 0.0–0.7)
Eosinophils Relative: 4.9 % (ref 0.0–5.0)
HCT: 36.2 % (ref 36.0–46.0)
Hemoglobin: 12 g/dL (ref 12.0–15.0)
Lymphocytes Relative: 28.6 % (ref 12.0–46.0)
Lymphs Abs: 1.6 10*3/uL (ref 0.7–4.0)
MCHC: 33.1 g/dL (ref 30.0–36.0)
MCV: 99.1 fl (ref 78.0–100.0)
Monocytes Absolute: 0.6 10*3/uL (ref 0.1–1.0)
Monocytes Relative: 10.6 % (ref 3.0–12.0)
Neutro Abs: 3.1 10*3/uL (ref 1.4–7.7)
Neutrophils Relative %: 54.6 % (ref 43.0–77.0)
Platelets: 149 10*3/uL — ABNORMAL LOW (ref 150.0–400.0)
RBC: 3.65 Mil/uL — ABNORMAL LOW (ref 3.87–5.11)
RDW: 14.4 % (ref 11.5–15.5)
WBC: 5.6 10*3/uL (ref 4.0–10.5)

## 2019-08-08 LAB — MAGNESIUM: Magnesium: 1.7 mg/dL (ref 1.5–2.5)

## 2019-08-08 LAB — PHOSPHORUS: Phosphorus: 3.9 mg/dL (ref 2.3–4.6)

## 2019-08-08 LAB — C-REACTIVE PROTEIN: CRP: <1 mg/dL (ref 0.5–20.0)

## 2019-08-09 LAB — PREALBUMIN: Prealbumin: 16 mg/dL — ABNORMAL LOW (ref 17–34)

## 2019-08-16 ENCOUNTER — Other Ambulatory Visit: Payer: Self-pay | Admitting: Family Medicine

## 2019-08-20 ENCOUNTER — Other Ambulatory Visit: Payer: Self-pay | Admitting: Internal Medicine

## 2019-09-09 ENCOUNTER — Other Ambulatory Visit (INDEPENDENT_AMBULATORY_CARE_PROVIDER_SITE_OTHER): Payer: Medicare Other

## 2019-09-09 DIAGNOSIS — K912 Postsurgical malabsorption, not elsewhere classified: Secondary | ICD-10-CM

## 2019-09-09 DIAGNOSIS — K90829 Short bowel syndrome, unspecified: Secondary | ICD-10-CM

## 2019-09-09 LAB — CBC WITH DIFFERENTIAL/PLATELET
Basophils Absolute: 0.1 10*3/uL (ref 0.0–0.1)
Basophils Relative: 0.9 % (ref 0.0–3.0)
Eosinophils Absolute: 0.4 10*3/uL (ref 0.0–0.7)
Eosinophils Relative: 5.6 % — ABNORMAL HIGH (ref 0.0–5.0)
HCT: 31.8 % — ABNORMAL LOW (ref 36.0–46.0)
Hemoglobin: 10.4 g/dL — ABNORMAL LOW (ref 12.0–15.0)
Lymphocytes Relative: 21.3 % (ref 12.0–46.0)
Lymphs Abs: 1.4 10*3/uL (ref 0.7–4.0)
MCHC: 32.6 g/dL (ref 30.0–36.0)
MCV: 99.6 fl (ref 78.0–100.0)
Monocytes Absolute: 0.8 10*3/uL (ref 0.1–1.0)
Monocytes Relative: 11.8 % (ref 3.0–12.0)
Neutro Abs: 3.9 10*3/uL (ref 1.4–7.7)
Neutrophils Relative %: 60.4 % (ref 43.0–77.0)
Platelets: 109 10*3/uL — ABNORMAL LOW (ref 150.0–400.0)
RBC: 3.19 Mil/uL — ABNORMAL LOW (ref 3.87–5.11)
RDW: 14.6 % (ref 11.5–15.5)
WBC: 6.5 10*3/uL (ref 4.0–10.5)

## 2019-09-09 LAB — COMPREHENSIVE METABOLIC PANEL
ALT: 15 U/L (ref 0–35)
AST: 19 U/L (ref 0–37)
Albumin: 3.1 g/dL — ABNORMAL LOW (ref 3.5–5.2)
Alkaline Phosphatase: 83 U/L (ref 39–117)
BUN: 23 mg/dL (ref 6–23)
CO2: 25 mEq/L (ref 19–32)
Calcium: 8.4 mg/dL (ref 8.4–10.5)
Chloride: 108 mEq/L (ref 96–112)
Creatinine, Ser: 0.84 mg/dL (ref 0.40–1.20)
GFR: 67.16 mL/min (ref >60.00)
Glucose, Bld: 79 mg/dL (ref 70–99)
Potassium: 4.2 mEq/L (ref 3.5–5.1)
Sodium: 138 mEq/L (ref 135–145)
Total Bilirubin: 0.4 mg/dL (ref 0.2–1.2)
Total Protein: 6.9 g/dL (ref 6.0–8.3)

## 2019-09-09 LAB — C-REACTIVE PROTEIN: CRP: 1.1 mg/dL (ref 0.5–20.0)

## 2019-09-09 LAB — MAGNESIUM: Magnesium: 1.9 mg/dL (ref 1.5–2.5)

## 2019-09-09 LAB — PHOSPHORUS: Phosphorus: 3.3 mg/dL (ref 2.3–4.6)

## 2019-09-09 LAB — FERRITIN: Ferritin: 71.3 ng/mL (ref 10.0–291.0)

## 2019-09-10 LAB — PREALBUMIN: Prealbumin: 12 mg/dL — ABNORMAL LOW (ref 17–34)

## 2019-09-13 ENCOUNTER — Telehealth: Payer: Self-pay | Admitting: Internal Medicine

## 2019-09-13 NOTE — Telephone Encounter (Signed)
Pt requested to have her recent lab results sent to [facility] at fax number [phone].

## 2019-09-13 NOTE — Telephone Encounter (Signed)
Lab work faxed as requested

## 2019-10-03 ENCOUNTER — Other Ambulatory Visit (INDEPENDENT_AMBULATORY_CARE_PROVIDER_SITE_OTHER): Payer: Medicare Other

## 2019-10-03 DIAGNOSIS — K912 Postsurgical malabsorption, not elsewhere classified: Secondary | ICD-10-CM | POA: Diagnosis not present

## 2019-10-03 DIAGNOSIS — K90829 Short bowel syndrome, unspecified: Secondary | ICD-10-CM

## 2019-10-03 LAB — CBC WITH DIFFERENTIAL/PLATELET
Basophils Absolute: 0.1 10*3/uL (ref 0.0–0.1)
Basophils Relative: 1.1 % (ref 0.0–3.0)
Eosinophils Absolute: 0.3 10*3/uL (ref 0.0–0.7)
Eosinophils Relative: 6.2 % — ABNORMAL HIGH (ref 0.0–5.0)
HCT: 35.3 % — ABNORMAL LOW (ref 36.0–46.0)
Hemoglobin: 11.8 g/dL — ABNORMAL LOW (ref 12.0–15.0)
Lymphocytes Relative: 34.6 % (ref 12.0–46.0)
Lymphs Abs: 1.9 10*3/uL (ref 0.7–4.0)
MCHC: 33.3 g/dL (ref 30.0–36.0)
MCV: 97.5 fl (ref 78.0–100.0)
Monocytes Absolute: 0.8 10*3/uL (ref 0.1–1.0)
Monocytes Relative: 14.2 % — ABNORMAL HIGH (ref 3.0–12.0)
Neutro Abs: 2.4 10*3/uL (ref 1.4–7.7)
Neutrophils Relative %: 43.9 % (ref 43.0–77.0)
Platelets: 140 10*3/uL — ABNORMAL LOW (ref 150.0–400.0)
RBC: 3.62 Mil/uL — ABNORMAL LOW (ref 3.87–5.11)
RDW: 14.4 % (ref 11.5–15.5)
WBC: 5.4 10*3/uL (ref 4.0–10.5)

## 2019-10-03 LAB — COMPREHENSIVE METABOLIC PANEL
ALT: 19 U/L (ref 0–35)
AST: 20 U/L (ref 0–37)
Albumin: 3.7 g/dL (ref 3.5–5.2)
Alkaline Phosphatase: 105 U/L (ref 39–117)
BUN: 17 mg/dL (ref 6–23)
CO2: 23 mEq/L (ref 19–32)
Calcium: 9 mg/dL (ref 8.4–10.5)
Chloride: 106 mEq/L (ref 96–112)
Creatinine, Ser: 1.07 mg/dL (ref 0.40–1.20)
GFR: 50.78 mL/min — ABNORMAL LOW (ref >60.00)
Glucose, Bld: 81 mg/dL (ref 70–99)
Potassium: 4.1 mEq/L (ref 3.5–5.1)
Sodium: 137 mEq/L (ref 135–145)
Total Bilirubin: 0.5 mg/dL (ref 0.2–1.2)
Total Protein: 7.7 g/dL (ref 6.0–8.3)

## 2019-10-03 LAB — PHOSPHORUS: Phosphorus: 4 mg/dL (ref 2.3–4.6)

## 2019-10-03 LAB — C-REACTIVE PROTEIN: CRP: <1 mg/dL (ref 0.5–20.0)

## 2019-10-03 LAB — MAGNESIUM: Magnesium: 1.8 mg/dL (ref 1.5–2.5)

## 2019-10-04 LAB — PREALBUMIN: Prealbumin: 20 mg/dL (ref 17–34)

## 2019-10-12 ENCOUNTER — Encounter: Payer: Self-pay | Admitting: Internal Medicine

## 2019-10-12 ENCOUNTER — Ambulatory Visit (INDEPENDENT_AMBULATORY_CARE_PROVIDER_SITE_OTHER): Payer: Medicare Other | Admitting: Internal Medicine

## 2019-10-12 VITALS — BP 134/74 | HR 72 | Ht 68.0 in | Wt 141.0 lb

## 2019-10-12 DIAGNOSIS — K6389 Other specified diseases of intestine: Secondary | ICD-10-CM | POA: Diagnosis not present

## 2019-10-12 DIAGNOSIS — Z8719 Personal history of other diseases of the digestive system: Secondary | ICD-10-CM | POA: Diagnosis not present

## 2019-10-12 DIAGNOSIS — K912 Postsurgical malabsorption, not elsewhere classified: Secondary | ICD-10-CM | POA: Diagnosis not present

## 2019-10-12 NOTE — Progress Notes (Addendum)
 [name] [name] 68 y.o. 12/09/1950 [id number]  Assessment & Plan:   Encounter Diagnoses  Name Primary?  . Acquired short bowel syndrome Yes  . Bacterial overgrowth syndrome   . H/O mesenteric infarction    Doing well Continue TPN Agree with considering change of PICC I will query IR and Dr. Comer  See cardiology and PCP for routine f/u  RTC 6 mos sooner prn  10/14/2019   Messaged Drs McCullough and Comer - not appropriate to prophylactically change her central venous access line. My Chart message to patient sent  [name]   12/20/2019 Addendum  Patient is on TPN 4 days a week due to concomitant use of Gattex.  She requires 4 days a week TPN to maintain appropriate weight and nutritional status.   E. , MD, FACG   Subjective:   Chief Complaint: f/u short bowel syndrome  HPI [name] [name] is here stating she is w/o problems. Ondansetron is effective for diarrhea. PICC was last changed 07/2018 - it is working well but she is ?ing if it needs to be changed to prevent problems.    Wt Readings from Last 3 Encounters:  10/12/19 141 lb (64 kg)  02/01/19 140 lb (63.5 kg)  12/07/18 139 lb (63 kg)    Allergies  Allergen Reactions  . Penicillins Itching, Swelling and Other (See Comments)    Reaction:  Facial swelling Has patient had a PCN reaction causing immediate rash, facial/tongue/throat swelling, SOB or lightheadedness with hypotension: Yes Has patient had a PCN reaction causing severe rash involving mucus membranes or skin necrosis: No Has patient had a PCN reaction that required hospitalization No Has patient had a PCN reaction occurring within the last 10 years: No If all of the above answers are "NO", then may proceed with Cephalosporin use.   Current Meds  Medication Sig  . acetaminophen (TYLENOL) 500 MG tablet Take 1,000 mg by mouth daily as needed for mild pain.  . ADULT TPN 1,800 mLs 4 (four) times a week. Pt receives home TPN from [facility]:  1800 mL  bag, four nights weekly (Monday, Tuesday, Wednesday, Thursday for 8 hours (includes 1 hour taper up and down).  . albuterol (VENTOLIN HFA) 108 (90 Base) MCG/ACT inhaler TAKE 2 PUFFS BY MOUTH EVERY 6 HOURS AS NEEDED FOR WHEEZE OR SHORTNESS OF BREATH  . benzonatate (TESSALON) 100 MG capsule Take 1 capsule (100 mg total) by mouth every 8 (eight) hours.  . ceFEPIme (MAXIPIME) 2 g injection Inject 2 g into the vein every 8 (eight) hours.  . clobetasol ointment (TEMOVATE) 0.05 % Apply 1 application topically 2 (two) times daily.  . diphenhydrAMINE (BENADRYL) 25 mg capsule Take 25-50 mg by mouth every 6 (six) hours as needed for itching or sleep.   . diphenoxylate-atropine (LOMOTIL) 2.5-0.025 MG tablet Take 1 tablet by mouth 4 (four) times daily as needed for diarrhea or loose stools.  . Multiple Vitamin (MULTIVITAMIN WITH MINERALS) TABS Take 1 tablet by mouth daily.  . omeprazole (PRILOSEC) 40 MG capsule TAKE 1 CAPSULE 2 (TWO) TIMES DAILY BY MOUTH. TAKE BEFORE BREAKFAST AND SUPPER  . ondansetron (ZOFRAN-ODT) 8 MG disintegrating tablet Take 1 tablet (8 mg total) by mouth every 8 (eight) hours as needed for nausea or vomiting.  . PRESCRIPTION MEDICATION 5 mLs See admin instructions. 50 units Heparin sodium flush  . sodium chloride 0.9 % infusion Uses for TPN  . Teduglutide, rDNA, (GATTEX) 5 MG KIT Inject 3.3 Units into the skin daily.  . vitamin E   400 UNIT capsule Take 400 Units by mouth daily.   . XARELTO 20 MG TABS tablet TAKE 1 TABLET (20 MG TOTAL) BY MOUTH DAILY WITH SUPPER.   Past Medical History:  Diagnosis Date  . Abnormal LFTs 2013  . Allergic rhinosinusitis   . Anemia of chronic disease   . At risk for dental problems   . Atypical nevus   . Bacteremia due to Klebsiella pneumoniae 12/12/2011  . Bacterial overgrowth syndrome   . 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 Carotid stenosis    Carotid US (9/15):  R 40-59%; L 1-39% >> FU 1 year  .  Closed left subtrochanteric femur fracture (HCC) 12/31/2017  . Congestive heart failure (CHF) (HCC)    EF 25-30%  . 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 Fracture of left clavicle   . History of blood transfusion 2013   anemia  . Hx of cardiovascular stress test    Myoview (9/15):  inf-apical scar; no ischemia; EF 47% - low risk   . 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 Osteoporosis   . Pancytopenia 10/07/2011  . Pathologic fracture of neck of femur (HCC)   . Personal history of colonic polyps   . Renal insufficiency    hx of yrs ago  . Serratia marcescens infection - bactermia assoc w/ PICC 01/18/2015  . 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HCC)   . Splenomegaly    By ultrasound  . Thrombophilia (HCC)   . Vitamin D deficiency    Past Surgical History:  Procedure Laterality Date  . APPENDECTOMY  yrs ago  . CHOLECYSTECTOMY  yrs ago  . COLONOSCOPY  12/05/2005   internal hemorrhoids (for polyp surveillance)  . COLONOSCOPY  04/26/2012   Procedure: COLONOSCOPY;  Surgeon: Itzae Miralles E Jancy Sprankle, MD;  Location: [facility];  Service: Endoscopy;  Laterality: N/A;  . COLONOSCOPY WITH PROPOFOL N/A 03/18/2016   Procedure: COLONOSCOPY WITH PROPOFOL;  Surgeon: Ryoma Nofziger E Ranee Peasley, MD;  Location: [facility];  Service: Endoscopy;  Laterality: N/A;  . ESOPHAGOGASTRODUODENOSCOPY  01/22/2009   erosive esophagitis  . HARDWARE REMOVAL Left 12/31/2017   Procedure: HARDWARE REMOVAL;  Surgeon: Swinteck, Brian, MD;  Location: [facility];  Service: Orthopedics;  Laterality: Left;  . INTRAMEDULLARY (IM) NAIL INTERTROCHANTERIC Left 12/31/2017   Procedure: INTRAMEDULLARY (IM) NAIL SUBTROCHANTRIC;  Surgeon: Swinteck, Brian, MD;  Location: [facility];  Service: Orthopedics;  Laterality: Left;  . IR CV LINE INJECTION  03/23/2018  . IR FLUORO GUIDE CV LINE LEFT  01/01/2018  . IR FLUORO GUIDE CV LINE LEFT  03/24/2018  . IR FLUORO GUIDE CV LINE LEFT  05/21/2018  . IR  FLUORO GUIDE CV LINE LEFT  07/09/2018  . IR GENERIC HISTORICAL  07/04/2016   IR REMOVAL TUN CV CATH W/O FL 07/04/2016 Kevin Bruning, PA-C [facility]  . IR GENERIC HISTORICAL  07/10/2016   IR US GUIDE VASC ACCESS LEFT 07/10/2016 Daniel Hassell, MD [facility]  . IR GENERIC HISTORICAL  07/10/2016   IR FLUORO GUIDE CV LINE LEFT 07/10/2016 Daniel Hassell, MD [facility]  . IR PATIENT EVAL TECH 0-60 MINS  10/07/2017  . IR PATIENT EVAL TECH 0-60 MINS  03/09/2018  . IR RADIOLOGIST EVAL & MGMT  07/21/2017  . ORIF PROXIMAL FEMORAL FRACTURE W/ ITST NAIL SYSTEM  03/2007   left, Dr. Aplington  . 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   Review of Systems As above  Objective:   Physical Exam BP 134/74   Pulse 72   Ht 5' 8" (1.727 m)   Wt 141 lb (64 kg)   LMP 02/26/2014   BMI 21.44 kg/m  NAD Lungs cta PICC left mid chest Cor NL abd soft and Nt w/ scar tissue to right of midline SQ as always

## 2019-10-12 NOTE — Patient Instructions (Addendum)
Glad things are going well.  I agree with contacting interventional radiology about the PICC ine and possible change and I will also check on this.  Also get follow-up with [name] and Dr. Johnsie Cancel  Have a great time at the beach!  I appreciate the opportunity to care for you. Gatha Mayer, MD, [facility]

## 2019-10-17 ENCOUNTER — Encounter (HOSPITAL_BASED_OUTPATIENT_CLINIC_OR_DEPARTMENT_OTHER): Payer: Self-pay

## 2019-10-17 ENCOUNTER — Emergency Department (HOSPITAL_BASED_OUTPATIENT_CLINIC_OR_DEPARTMENT_OTHER): Payer: Medicare Other

## 2019-10-17 ENCOUNTER — Other Ambulatory Visit: Payer: Self-pay

## 2019-10-17 ENCOUNTER — Emergency Department (HOSPITAL_BASED_OUTPATIENT_CLINIC_OR_DEPARTMENT_OTHER)
Admission: EM | Admit: 2019-10-17 | Discharge: 2019-10-17 | Disposition: A | Discharge status: Home / Self Care | Payer: Medicare Other | Attending: Emergency Medicine | Admitting: Emergency Medicine

## 2019-10-17 DIAGNOSIS — Y998 Other external cause status: Secondary | ICD-10-CM | POA: Insufficient documentation

## 2019-10-17 DIAGNOSIS — Z7901 Long term (current) use of anticoagulants: Secondary | ICD-10-CM | POA: Diagnosis not present

## 2019-10-17 DIAGNOSIS — S4991XA Unspecified injury of right shoulder and upper arm, initial encounter: Secondary | ICD-10-CM | POA: Diagnosis present

## 2019-10-17 DIAGNOSIS — S42201A Unspecified fracture of upper end of right humerus, initial encounter for closed fracture: Secondary | ICD-10-CM | POA: Diagnosis not present

## 2019-10-17 DIAGNOSIS — W1839XA Other fall on same level, initial encounter: Secondary | ICD-10-CM | POA: Insufficient documentation

## 2019-10-17 DIAGNOSIS — Y93K1 Activity, walking an animal: Secondary | ICD-10-CM | POA: Diagnosis not present

## 2019-10-17 DIAGNOSIS — Z87891 Personal history of nicotine dependence: Secondary | ICD-10-CM | POA: Diagnosis not present

## 2019-10-17 DIAGNOSIS — M25511 Pain in right shoulder: Secondary | ICD-10-CM | POA: Diagnosis not present

## 2019-10-17 DIAGNOSIS — Y929 Unspecified place or not applicable: Secondary | ICD-10-CM | POA: Diagnosis not present

## 2019-10-17 DIAGNOSIS — S42291A Other displaced fracture of upper end of right humerus, initial encounter for closed fracture: Secondary | ICD-10-CM | POA: Insufficient documentation

## 2019-10-17 MED ORDER — OXYCODONE-ACETAMINOPHEN 5-325 MG PO TABS: 1.0000 | ORAL_TABLET | Freq: Four times a day (QID) | ORAL | 0 refills | Status: DC | PRN

## 2019-10-17 MED ORDER — OXYCODONE-ACETAMINOPHEN 5-325 MG PO TABS
1.0000 | ORAL_TABLET | Freq: Once | ORAL | Status: AC
  Administered 2019-10-17: 1 via ORAL
  Filled 2019-10-17: qty 1

## 2019-10-17 NOTE — ED Triage Notes (Signed)
Pt states she fell ~1130pm due to dog pulling her-pain to right shoulder-NAD-to triage in w/c

## 2019-10-17 NOTE — Discharge Instructions (Signed)
Continue taking home medications as prescribed. Use Tylenol as needed for mild to moderate pain.  Use Percocet as needed for severe or breakthrough pain.  Have caution, this may make you tired or groggy.  Do not drive or operate heavy machinery while taking this medicine. Call the orthopedic doctor listed below to set up a follow-up appointment. Use ice to help with pain and swelling. Return to the emergency room if you develop severe worsening pain, numbness in your hand, or any new, worsening, or concerning symptoms.

## 2019-10-17 NOTE — ED Provider Notes (Signed)
[facility] EMERGENCY DEPARTMENT Provider Note   CSN: [id number] Arrival date & time: 10/17/19  1248     History Chief Complaint  Patient presents with  . Fall    [name] [name] is a 69 y.o. female presenting for evaluation of right shoulder pain after fall.  Patient states she was walking her dog when she was pulled, and suddenly felt a pop in her right shoulder and pain.  She denies hitting her landing on her shoulder, but thinks she might of stretch her arm out in front of her to break her fall.  She denies hitting her head or loss of consciousness.  She denies pain anywhere other than her right shoulder.  She denies headache, neck pain, back pain, chest pain.  She takes Xarelto.  She has ambulated since the fall without difficulty.  She has not taken anything for pain including Tylenol.  She has seen Dr. Lyla Glassing before with orthopedics, for 5 years ago, for left hip fracture.  HPI     Past Medical History:  Diagnosis Date  . Abnormal LFTs 2013  . Allergic rhinosinusitis   . Anemia of chronic disease   . At risk for dental problems   . Atypical nevus   . Bacteremia due to Klebsiella pneumoniae 12/12/2011  . Bacterial overgrowth syndrome   . Brachial vein thrombus, left ([place]) 10/08/2012  . Carnitine deficiency ([place]) 05/25/2018  . Carotid stenosis    Carotid US (9/15):  R 40-59%; L 1-39% >> FU 1 year  . Closed left subtrochanteric femur fracture ([place]) 12/31/2017  . Congestive heart failure (CHF) (HCC)    EF 25-30%  . 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 Fracture of left clavicle   . History of blood transfusion 2013   anemia  . Hx of cardiovascular stress test    Myoview (9/15):  inf-apical scar; no ischemia; EF 47% - low risk   . 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 Osteoporosis   . Pancytopenia 10/07/2011  . Pathologic fracture of neck of femur ([place])   . Personal history of colonic polyps   . Renal insufficiency    hx of yrs ago  .  Serratia marcescens infection - bactermia assoc w/ PICC 01/18/2015  . 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[place])   . Splenomegaly    By ultrasound  . Thrombophilia ([place])   . Vitamin D deficiency     Patient Active Problem List   Diagnosis Date Noted  . Carnitine deficiency ([place]) 05/25/2018  . Hypotension 12/30/2017  . Bradycardia 12/30/2017  . 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 Degenerative joint disease 07/08/2016  . PICC line infection 05/16/2015  . 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 Splenomegaly   . Hx of adenomatous colonic polyps 04/18/2012  . Chronic systolic heart failure ([place]) 12/25/2011  . Osteoporosis 03/14/2010  . Anemia of chronic disease 10/01/2009  . GERD (gastroesophageal reflux disease) 11/19/2008  . ALKALINE PHOSPHATASE, ELEVATED 10/12/2008  . VITAMIN D DEFICIENCY 01/19/2008  . TRANSAMINASES, SERUM, ELEVATED 01/19/2008  . Bacterial overgrowth syndrome 11/19/2004  . Acquired short bowel syndrome 11/20/2003    Past Surgical History:  Procedure Laterality Date  . APPENDECTOMY  yrs ago  . CHOLECYSTECTOMY  yrs ago  . COLONOSCOPY  12/05/2005   internal hemorrhoids (for polyp surveillance)  . COLONOSCOPY  04/26/2012   Procedure: COLONOSCOPY;  Surgeon: Glendell Docker  Simonne Maffucci, MD;  Location: [facility];  Service: Endoscopy;  Laterality: N/A;  . COLONOSCOPY WITH PROPOFOL N/A 03/18/2016   Procedure: COLONOSCOPY WITH PROPOFOL;  Surgeon: Gatha Mayer, MD;  [facility];  Service: Endoscopy;  Laterality: N/A;  . ESOPHAGOGASTRODUODENOSCOPY  01/22/2009   erosive esophagitis  . HARDWARE REMOVAL Left 12/31/2017   Procedure: HARDWARE REMOVAL;  Surgeon: Rod Can, MD;  [facility];  Service: Orthopedics;  Laterality: Left;  . INTRAMEDULLARY (IM) NAIL INTERTROCHANTERIC Left 12/31/2017   Procedure: INTRAMEDULLARY (IM) NAIL SUBTROCHANTRIC;  Surgeon:  Rod Can, MD;  Location: [facility];  Service: Orthopedics;  Laterality: Left;  . IR CV LINE INJECTION  03/23/2018  . IR FLUORO GUIDE CV LINE LEFT  01/01/2018  . IR FLUORO GUIDE CV LINE LEFT  03/24/2018  . IR FLUORO GUIDE CV LINE LEFT  05/21/2018  . IR FLUORO GUIDE CV LINE LEFT  07/09/2018  . IR GENERIC HISTORICAL  07/04/2016   IR REMOVAL TUN CV CATH W/O FL 07/04/2016 Ascencion Dike, PA-C [facility]  . IR GENERIC HISTORICAL  07/10/2016   IR US GUIDE VASC ACCESS LEFT 07/10/2016 Arne Cleveland, MD [facility]  . IR GENERIC HISTORICAL  07/10/2016   IR FLUORO GUIDE CV LINE LEFT 07/10/2016 Arne Cleveland, MD [facility]  . IR PATIENT EVAL TECH 0-60 MINS  10/07/2017  . IR PATIENT EVAL TECH 0-60 MINS  03/09/2018  . IR RADIOLOGIST EVAL & MGMT  07/21/2017  . ORIF PROXIMAL FEMORAL FRACTURE W/ ITST NAIL SYSTEM  03/2007   left, Dr. Shellia Carwin  . SMALL INTESTINE SURGERY  2005   multiple with right colon resection for ischemia/infarct     OB History    Gravida  2   Para  2   Term      Preterm      AB      Living  1     SAB      TAB      Ectopic      Multiple      Live Births              Family History  Problem Relation Age of Onset  . Diabetes Mother   . Hypertension Mother   . AAA (abdominal aortic aneurysm) Mother   . Colon cancer Neg Hx   . Stomach cancer Neg Hx     Social History   Tobacco Use  . Smoking status: Former Smoker    Types: Cigarettes    Quit date: 05/12/2004    Years since quitting: 15.4  . Smokeless tobacco: Never Used  Substance Use Topics  . Alcohol use: No  . Drug use: No    Home Medications Prior to Admission medications   Medication Sig Start Date End Date Taking? Authorizing Provider  acetaminophen (TYLENOL) 500 MG tablet Take 1,000 mg by mouth daily as needed for mild pain.    [provider]  ADULT TPN 1,800 mLs 4 (four) times a week. Pt receives home TPN from [facility]:  1800 mL bag, four nights weekly (Monday, Tuesday,  Wednesday, Thursday for 8 hours (includes 1 hour taper up and down).    [provider]  albuterol (VENTOLIN HFA) 108 (90 Base) MCG/ACT inhaler TAKE 2 PUFFS BY MOUTH EVERY 6 HOURS AS NEEDED FOR WHEEZE OR SHORTNESS OF BREATH 08/16/19   [name], MD  benzonatate (TESSALON) 100 MG capsule Take 1 capsule (100 mg total) by mouth every 8 (eight) hours. 05/28/18   Melina Copa,  Rebeca Alert, MD  clobetasol ointment (TEMOVATE) 3.22 % Apply 1 application topically 2 (two) times daily. 12/23/18   Thayer Headings, MD  diphenhydrAMINE (BENADRYL) 25 mg capsule Take 25-50 mg by mouth every 6 (six) hours as needed for itching or sleep.     [provider]  diphenoxylate-atropine (LOMOTIL) 2.5-0.025 MG tablet Take 1 tablet by mouth 4 (four) times daily as needed for diarrhea or loose stools. 03/24/16   Gatha Mayer, MD  Multiple Vitamin (MULTIVITAMIN WITH MINERALS) TABS Take 1 tablet by mouth daily.    [provider]  omeprazole (PRILOSEC) 40 MG capsule TAKE 1 CAPSULE 2 (TWO) TIMES DAILY BY MOUTH. TAKE BEFORE BREAKFAST AND SUPPER 08/22/19   Gatha Mayer, MD  ondansetron (ZOFRAN-ODT) 8 MG disintegrating tablet Take 1 tablet (8 mg total) by mouth every 8 (eight) hours as needed for nausea or vomiting. 01/03/19   Gatha Mayer, MD  oxyCODONE-acetaminophen (PERCOCET/ROXICET) 5-325 MG tablet Take 1 tablet by mouth every 6 (six) hours as needed for severe pain. 10/17/19   Clarinda Obi, PA-C  PRESCRIPTION MEDICATION 5 mLs See admin instructions. 50 units Heparin sodium flush    [provider]  sodium chloride 0.9 % infusion Uses for TPN 10/27/17   [provider]  Teduglutide, rDNA, (GATTEX) 5 MG KIT Inject 3.3 Units into the skin daily. 09/11/14   Gatha Mayer, MD  vitamin E 400 UNIT capsule Take 400 Units by mouth daily.     [provider]  XARELTO 20 MG TABS tablet TAKE 1 TABLET (20 MG TOTAL) BY MOUTH DAILY WITH SUPPER. 05/25/19   [name], MD     Allergies    Penicillins  Review of Systems   Review of Systems  Musculoskeletal: Positive for arthralgias.  Hematological: Bruises/bleeds easily.  All other systems reviewed and are negative.   Physical Exam Updated Vital Signs BP 127/77   Pulse 61   Temp 98.1 F (36.7 C)   Resp 16   Ht '5\' 8"'$  (1.727 m)   Wt 64 kg   LMP 02/26/2014   SpO2 100%   BMI 21.44 kg/m   Physical Exam Vitals and nursing note reviewed.  Constitutional:      General: She is not in acute distress.    Appearance: She is well-developed.     Comments: Appears uncomfortable due to pain, otherwise nontoxic  HENT:     Head: Normocephalic and atraumatic.     Comments: No signs of head trauma Eyes:     Conjunctiva/sclera: Conjunctivae normal.     Pupils: Pupils are equal, round, and reactive to light.  Neck:     Comments: No tenderness palpation midline C-spine.  No step-offs or deformities.  Moving head in all directions without pain Cardiovascular:     Rate and Rhythm: Normal rate and regular rhythm.     Pulses: Normal pulses.  Pulmonary:     Effort: Pulmonary effort is normal. No respiratory distress.     Breath sounds: Normal breath sounds. No wheezing.  Chest:     Chest wall: No tenderness.  Abdominal:     General: There is no distension.     Palpations: Abdomen is soft. There is no mass.     Tenderness: There is no abdominal tenderness. There is no guarding or rebound.  Musculoskeletal:        General: Tenderness present. Normal range of motion.     Cervical back: Normal range of motion and neck supple.  Comments: Swelling and deformity noted of the right proximal arm.  No significant hematoma or contusion.  No laceration.  Tenderness palpation of this area. Radial pulses 2+ bilaterally.  Grip strength equal bilaterally.  Good distal sensation and cap refill. No tenderness palpation of back or midline spine.  No step-offs or deformities. No tenderness palpation of the pelvis or hips.   Patient is ambulatory.  Skin:    General: Skin is warm and dry.     Capillary Refill: Capillary refill takes less than 2 seconds.  Neurological:     Mental Status: She is alert and oriented to person, place, and time.     ED Results / Procedures / Treatments   Labs (all labs ordered are listed, but only abnormal results are displayed) Labs Reviewed - No data to display  EKG None  Radiology DG Shoulder Right  Result Date: 10/17/2019 CLINICAL DATA:  RIGHT shoulder pain post fall today EXAM: RIGHT SHOULDER - 2+ VIEW COMPARISON:  None FINDINGS: Osseous demineralization. AC joint alignment normal. Displaced oblique fracture of the surgical neck and metaphysis at the proximal RIGHT humerus. Additional nondisplaced fracture plane at the greater tuberosity. No dislocation. Visualized RIGHT ribs intact. IMPRESSION: Comminuted displaced proximal RIGHT humeral fracture as above. Osseous demineralization. Electronically Signed   By: Lavonia Dana M.D.   On: 10/17/2019 13:45    Procedures Procedures (including critical care time)  Medications Ordered in ED Medications  oxyCODONE-acetaminophen (PERCOCET/ROXICET) 5-325 MG per tablet 1 tablet (1 tablet Oral Given 10/17/19 1358)    ED Course  I have reviewed the triage vital signs and the nursing notes.  Pertinent labs & imaging results that were available during my care of the patient were reviewed by me and considered in my medical decision making (see chart for details).    MDM Rules/Calculators/A&P                      Patient presenting for evaluation of right arm pain after a fall.  On exam, patient appears nontoxic.  Swelling, tenderness, deformity of the right proximal arm.  No obvious signs of trauma to the head.  As she is without headache or signs of trauma, I do not believe she needs a head CT.  Will obtain x-rays reassess.  X-rays viewed interpreted by me, consistent with proximal humerus fracture.  She is neurovascularly intact.   Will place in a sling and treat pain.  Case discussed with attending, Dr. Tyrone Nine evaluated the patient.  At this time, patient appears safe for discharge.  Return precautions given.  Patient states she understands and agrees to plan.  Final Clinical Impression(s) / ED Diagnoses Final diagnoses:  Other closed displaced fracture of proximal end of right humerus, initial encounter    Rx / DC Orders ED Discharge Orders         Ordered    oxyCODONE-acetaminophen (PERCOCET/ROXICET) 5-325 MG tablet  Every 6 hours PRN     10/17/19 1402           Latricia Cerrito, PA-C 10/17/19 [place], [place], DO 10/18/19 [phone]

## 2019-10-18 DIAGNOSIS — S42211A Unspecified displaced fracture of surgical neck of right humerus, initial encounter for closed fracture: Secondary | ICD-10-CM | POA: Diagnosis not present

## 2019-10-18 DIAGNOSIS — M25511 Pain in right shoulder: Secondary | ICD-10-CM | POA: Diagnosis not present

## 2019-10-22 ENCOUNTER — Emergency Department (HOSPITAL_BASED_OUTPATIENT_CLINIC_OR_DEPARTMENT_OTHER)
Admission: EM | Admit: 2019-10-22 | Discharge: 2019-10-22 | Disposition: A | Discharge status: Home / Self Care | Payer: Medicare Other | Attending: Emergency Medicine | Admitting: Emergency Medicine

## 2019-10-22 ENCOUNTER — Other Ambulatory Visit: Payer: Self-pay

## 2019-10-22 ENCOUNTER — Encounter (HOSPITAL_BASED_OUTPATIENT_CLINIC_OR_DEPARTMENT_OTHER): Payer: Self-pay | Admitting: Emergency Medicine

## 2019-10-22 DIAGNOSIS — Z87891 Personal history of nicotine dependence: Secondary | ICD-10-CM | POA: Insufficient documentation

## 2019-10-22 DIAGNOSIS — Z7901 Long term (current) use of anticoagulants: Secondary | ICD-10-CM | POA: Diagnosis not present

## 2019-10-22 DIAGNOSIS — M79601 Pain in right arm: Secondary | ICD-10-CM | POA: Diagnosis not present

## 2019-10-22 DIAGNOSIS — I5022 Chronic systolic (congestive) heart failure: Secondary | ICD-10-CM | POA: Diagnosis not present

## 2019-10-22 NOTE — Discharge Instructions (Signed)
Continue to wear the immobilizer for comfort.  Elevate if you can if not just leave your arm in the immobilizer.

## 2019-10-22 NOTE — ED Triage Notes (Signed)
Pt seen here Monday for fall and broken humerus. Pt seen at [facility] on Tuesday.  Pt c/o swelling to her hand and arm.  Pt requesting larger immobilizer. Pt reports pain has increased since unable to use immobilizer correctly.

## 2019-10-22 NOTE — ED Provider Notes (Signed)
[facility] DEPARTMENT Provider Note   CSN: [id number] Arrival date & time: 10/22/19  1707     History Chief Complaint  Patient presents with   Arm Pain    [name] [name] is a 69 y.o. female.  Patient is a 69 year old female who had a fall on Monday this week with a known right humerus fracture.  Patient does take Eliquis and aspirin and followed up with orthopedics on Tuesday.  She reports at that time she was starting to have significant bruising and swelling of her left arm.  Since that time the swelling and bruising has continued and she reports the sling feels too small and is cutting into her arm causing more pain.  She has more range of motion now than she did 2 days ago but is still uncomfortable.  She denies any numbness or tingling in her fingers.  The history is provided by the patient.  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   Fracture of left clavicle    History of blood transfusion 2013   anemia   Hx of cardiovascular stress test    Myoview (9/15):  inf-apical scar; no ischemia; EF 47% - low risk    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 12/12/2011   Osteoporosis    Pancytopenia 10/07/2011   Pathologic fracture of neck of femur ([place])    Personal history of colonic polyps    Renal insufficiency    hx of yrs ago   Serratia marcescens infection - bactermia assoc w/ PICC  01/18/2015   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HCC)    Splenomegaly    By ultrasound   Thrombophilia ([place])    Vitamin D deficiency     Patient Active Problem List   Diagnosis Date Noted   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   Hypotension 12/30/2017   Bradycardia 12/30/2017   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   Degenerative joint disease 07/08/2016   PICC line infection 05/16/2015   H/O mesenteric infarction 11/17/2013   Splenomegaly    Hx of adenomatous colonic polyps 17/40/8144   Chronic systolic heart failure ([place]) 12/25/2011   Osteoporosis 03/14/2010   Anemia of chronic disease 10/01/2009   GERD (gastroesophageal reflux disease) 11/19/2008   ALKALINE PHOSPHATASE, ELEVATED 10/12/2008   VITAMIN D DEFICIENCY 01/19/2008   TRANSAMINASES, SERUM, ELEVATED 01/19/2008   Bacterial overgrowth syndrome 11/19/2004   Acquired short bowel syndrome 11/20/2003    Past Surgical History:  Procedure Laterality Date   APPENDECTOMY  yrs ago   CHOLECYSTECTOMY  yrs ago   COLONOSCOPY  12/05/2005   internal hemorrhoids (for polyp surveillance)   COLONOSCOPY  04/26/2012   Procedure: COLONOSCOPY;  Surgeon: Gatha Mayer, MD;  Location: [facility];  Service: Endoscopy;  Laterality: N/A;   COLONOSCOPY WITH PROPOFOL N/A 03/18/2016   Procedure: COLONOSCOPY WITH PROPOFOL;  Surgeon: Gatha Mayer, MD;  Location: [facility];  Service: Endoscopy;  Laterality: N/A;   ESOPHAGOGASTRODUODENOSCOPY  01/22/2009   erosive esophagitis   HARDWARE REMOVAL Left 12/31/2017   Procedure: HARDWARE REMOVAL;  Surgeon: Rod Can, MD;  Location: [facility];  Service: Orthopedics;  Laterality: Left;   INTRAMEDULLARY (IM) NAIL INTERTROCHANTERIC Left 12/31/2017   Procedure: INTRAMEDULLARY (IM) NAIL SUBTROCHANTRIC;  Surgeon: Rod Can, MD;  Location: [facility];  Service:  Orthopedics;  Laterality: Left;   IR CV LINE INJECTION  03/23/2018   IR FLUORO GUIDE CV LINE LEFT  01/01/2018   IR FLUORO GUIDE CV LINE LEFT  03/24/2018   IR FLUORO GUIDE CV LINE LEFT  05/21/2018   IR FLUORO GUIDE CV LINE LEFT  07/09/2018   IR GENERIC HISTORICAL  07/04/2016   IR REMOVAL TUN CV CATH W/O FL 07/04/2016 Ascencion Dike, PA-C [facility]   IR GENERIC HISTORICAL  07/10/2016   IR US GUIDE VASC ACCESS LEFT 07/10/2016 Arne Cleveland, MD [facility]   IR GENERIC HISTORICAL  07/10/2016   IR FLUORO GUIDE CV LINE LEFT 07/10/2016 Arne Cleveland, MD [facility]   IR PATIENT EVAL TECH 0-60 MINS  10/07/2017   IR PATIENT EVAL TECH 0-60 MINS  03/09/2018   IR RADIOLOGIST EVAL & MGMT  07/21/2017   ORIF PROXIMAL FEMORAL FRACTURE W/ ITST NAIL SYSTEM  03/2007   left, Dr. Shellia Carwin   SMALL INTESTINE SURGERY  2005   multiple with right colon resection for ischemia/infarct     OB History    Gravida  2   Para  2   Term      Preterm      AB      Living  1     SAB      TAB      Ectopic      Multiple      Live Births              Family History  Problem Relation Age of Onset   Diabetes Mother    Hypertension Mother    AAA (abdominal aortic aneurysm) Mother    Colon cancer Neg Hx    Stomach cancer Neg Hx     Social History   Tobacco Use   Smoking status: Former Smoker    Types: Cigarettes    Quit date: 05/12/2004    Years since quitting: 15.4   Smokeless tobacco: Never Used  Scientific laboratory technician Use: Never used  Substance Use Topics   Alcohol use: No   Drug use: No    Home Medications Prior to Admission medications   Medication Sig Start Date End Date Taking? Authorizing Provider  acetaminophen (TYLENOL) 500 MG tablet Take 1,000 mg by mouth daily as needed for mild pain.    [provider]  ADULT TPN 1,800 mLs 4 (four) times a week. Pt receives home TPN from [facility]:  1800 mL bag, four nights weekly (Monday, Tuesday, Wednesday,  Thursday for 8 hours (includes 1 hour taper up and down).    [provider]  albuterol (VENTOLIN HFA) 108 (90 Base) MCG/ACT inhaler TAKE 2 PUFFS BY MOUTH EVERY 6 HOURS AS NEEDED FOR WHEEZE OR SHORTNESS OF BREATH 08/16/19   [name], MD  benzonatate (TESSALON) 100 MG capsule Take 1 capsule (100 mg total) by mouth every 8 (eight) hours. 05/28/18  Hayden Rasmussen, MD  clobetasol ointment (TEMOVATE) 1.63 % Apply 1 application topically 2 (two) times daily. 12/23/18   Thayer Headings, MD  diphenhydrAMINE (BENADRYL) 25 mg capsule Take 25-50 mg by mouth every 6 (six) hours as needed for itching or sleep.     [provider]  diphenoxylate-atropine (LOMOTIL) 2.5-0.025 MG tablet Take 1 tablet by mouth 4 (four) times daily as needed for diarrhea or loose stools. 03/24/16   Gatha Mayer, MD  Multiple Vitamin (MULTIVITAMIN WITH MINERALS) TABS Take 1 tablet by mouth daily.    [provider]  omeprazole (PRILOSEC) 40 MG capsule TAKE 1 CAPSULE 2 (TWO) TIMES DAILY BY MOUTH. TAKE BEFORE BREAKFAST AND SUPPER 08/22/19   Gatha Mayer, MD  ondansetron (ZOFRAN-ODT) 8 MG disintegrating tablet Take 1 tablet (8 mg total) by mouth every 8 (eight) hours as needed for nausea or vomiting. 01/03/19   Gatha Mayer, MD  oxyCODONE-acetaminophen (PERCOCET/ROXICET) 5-325 MG tablet Take 1 tablet by mouth every 6 (six) hours as needed for severe pain. 10/17/19   Caccavale, Sophia, PA-C  PRESCRIPTION MEDICATION 5 mLs See admin instructions. 50 units Heparin sodium flush    [provider]  sodium chloride 0.9 % infusion Uses for TPN 10/27/17   [provider]  Teduglutide, rDNA, (GATTEX) 5 MG KIT Inject 3.3 Units into the skin daily. 09/11/14   Gatha Mayer, MD  vitamin E 400 UNIT capsule Take 400 Units by mouth daily.     [provider]  XARELTO 20 MG TABS tablet TAKE 1 TABLET (20 MG TOTAL) BY MOUTH DAILY WITH SUPPER. 05/25/19   [name], MD    Allergies     Penicillins  Review of Systems   Review of Systems  All other systems reviewed and are negative.   Physical Exam Updated Vital Signs BP 134/81 (BP Location: Left Arm)    Pulse 67    Temp 98.3 F (36.8 C) (Oral)    Resp 16    Ht '5\' 8"'$  (1.727 m)    Wt 64 kg    LMP 02/26/2014    SpO2 99%    BMI 21.44 kg/m   Physical Exam Vitals and nursing note reviewed.  Constitutional:      General: She is not in acute distress.    Appearance: Normal appearance. She is normal weight.  HENT:     Head: Normocephalic.  Cardiovascular:     Rate and Rhythm: Normal rate.     Pulses: Normal pulses.  Pulmonary:     Effort: Pulmonary effort is normal.     Comments: IV line present in the left chest Musculoskeletal:     Comments: Significant edema, ecchymosis and swelling noted from the mid arm down to the hand.  Normal sensation in the fingers and hand is warm.  Skin:    Capillary Refill: Capillary refill takes less than 2 seconds.  Neurological:     General: No focal deficit present.     Mental Status: She is alert.  Psychiatric:        Mood and Affect: Mood normal.        Behavior: Behavior normal.        Thought Content: Thought content normal.     ED Results / Procedures / Treatments   Labs (all labs ordered are listed, but only abnormal results are displayed) Labs Reviewed - No data to display  EKG None  Radiology No results found.  Procedures Procedures (including critical care time)  Medications Ordered in ED Medications - No data to display  ED Course  I have reviewed the triage vital signs and the nursing notes.  Pertinent labs & imaging results that were available during my care of the patient were reviewed by me and considered in my medical decision making (see chart for details).    MDM Rules/Calculators/A&P                          Patient returning today due to worsening swelling in her arm and discomfort from the sling being too small.  Patient currently has a  medium immobilizer and it is too small causing indentation in her arm from the swelling.  Patient is otherwise neurovascularly intact and feel that this is all a consequence of her humerus fracture and being on Xarelto and aspirin.  Patient was placed in a larger immobilizer with improvement of her discomfort.  She has follow-up with orthopedics and discuss if she is able to tolerate it she can also elevate her right arm.  Final Clinical Impression(s) / ED Diagnoses Final diagnoses:  Right arm pain    Rx / DC Orders ED Discharge Orders    None       Blanchie Dessert, MD 10/22/19 1846

## 2019-10-27 DIAGNOSIS — M25511 Pain in right shoulder: Secondary | ICD-10-CM | POA: Diagnosis not present

## 2019-10-28 ENCOUNTER — Encounter: Payer: Self-pay | Admitting: Family Medicine

## 2019-10-28 ENCOUNTER — Telehealth (INDEPENDENT_AMBULATORY_CARE_PROVIDER_SITE_OTHER): Payer: Medicare Other | Admitting: Family Medicine

## 2019-10-28 ENCOUNTER — Other Ambulatory Visit: Payer: Self-pay

## 2019-10-28 DIAGNOSIS — J329 Chronic sinusitis, unspecified: Secondary | ICD-10-CM | POA: Diagnosis not present

## 2019-10-28 DIAGNOSIS — B9689 Other specified bacterial agents as the cause of diseases classified elsewhere: Secondary | ICD-10-CM | POA: Diagnosis not present

## 2019-10-28 MED ORDER — DOXYCYCLINE HYCLATE 100 MG PO TABS: 100.0000 mg | ORAL_TABLET | Freq: Two times a day (BID) | ORAL | 0 refills | Status: DC

## 2019-10-28 NOTE — Progress Notes (Signed)
I have discussed the procedure for the virtual visit with the patient who has given consent to proceed with assessment and treatment.   Pt unable to obtain vitals.   Ashonte Angelucci L Jobani Sabado, CMA     

## 2019-10-28 NOTE — Progress Notes (Signed)
Virtual Visit via Video   I connected with patient on 10/28/19 at 10:30 AM EDT by a video enabled telemedicine application and verified that I am speaking with the correct person using two identifiers.  Location patient: Home Location provider: Acupuncturist, Office Persons participating in the virtual visit: Patient, Provider, [place] (Jess B)  I discussed the limitations of evaluation and management by telemedicine and the availability of in person appointments. The patient expressed understanding and agreed to proceed.  Subjective:   HPI:   URI- sxs started 'a couple of days ago'.  Pt reports 'it's full around my eyeballs'.  + HA, PND, wet cough.  No fevers.  + maxillary sinus pain and 'just over my eyebrows'.  No tooth pain.  + nausea- thinks it was due to pain medicine.  Pt has hx of sinus infxns and this feels similar.  Pt is immunosuppressed due to poor nutrition and ongoing medical issues.  ROS:   See pertinent positives and negatives per HPI.  Patient Active Problem List   Diagnosis Date Noted  . 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 Hypotension 12/30/2017  . Bradycardia 12/30/2017  . 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 Degenerative joint disease 07/08/2016  . PICC line infection 05/16/2015  . 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 Splenomegaly   . Hx of adenomatous colonic polyps 04/18/2012  . Chronic systolic heart failure ([place]) 12/25/2011  . Osteoporosis 03/14/2010  . Anemia of chronic disease 10/01/2009  . GERD (gastroesophageal reflux disease) 11/19/2008  . ALKALINE PHOSPHATASE, ELEVATED 10/12/2008  . VITAMIN D DEFICIENCY 01/19/2008  . TRANSAMINASES, SERUM, ELEVATED 01/19/2008  . Bacterial overgrowth syndrome 11/19/2004  . Acquired short bowel syndrome 11/20/2003    Social History   Tobacco Use  . Smoking status: Former Smoker    Types: Cigarettes    Quit date: 05/12/2004     Years since quitting: 15.4  . Smokeless tobacco: Never Used  Substance Use Topics  . Alcohol use: No    Current Outpatient Medications:  .  acetaminophen (TYLENOL) 500 MG tablet, Take 1,000 mg by mouth daily as needed for mild pain., Disp: , Rfl:  .  ADULT TPN, 1,800 mLs 4 (four) times a week. Pt receives home TPN from [facility]:  1800 mL bag, four nights weekly (Monday, Tuesday, Wednesday, Thursday for 8 hours (includes 1 hour taper up and down)., Disp: , Rfl:  .  albuterol (VENTOLIN HFA) 108 (90 Base) MCG/ACT inhaler, TAKE 2 PUFFS BY MOUTH EVERY 6 HOURS AS NEEDED FOR WHEEZE OR SHORTNESS OF BREATH, Disp: 18 g, Rfl: 2 .  clobetasol ointment (TEMOVATE) 8.03 %, Apply 1 application topically 2 (two) times daily., Disp: 30 g, Rfl: 0 .  diphenhydrAMINE (BENADRYL) 25 mg capsule, Take 25-50 mg by mouth every 6 (six) hours as needed for itching or sleep. , Disp: , Rfl:  .  diphenoxylate-atropine (LOMOTIL) 2.5-0.025 MG tablet, Take 1 tablet by mouth 4 (four) times daily as needed for diarrhea or loose stools., Disp: 90 tablet, Rfl: 5 .  methocarbamol (ROBAXIN) 500 MG tablet, Take 500 mg by mouth every 8 (eight) hours as needed for muscle spasms., Disp: , Rfl:  .  Multiple Vitamin (MULTIVITAMIN WITH MINERALS) TABS, Take 1 tablet by mouth daily., Disp: , Rfl:  .  omeprazole (PRILOSEC) 40 MG capsule, TAKE 1 CAPSULE 2 (TWO) TIMES DAILY BY MOUTH. TAKE BEFORE BREAKFAST AND SUPPER, Disp: 180 capsule, Rfl: 1 .  ondansetron (ZOFRAN-ODT) 8  MG disintegrating tablet, Take 1 tablet (8 mg total) by mouth every 8 (eight) hours as needed for nausea or vomiting., Disp: 30 tablet, Rfl: 11 .  oxyCODONE-acetaminophen (PERCOCET/ROXICET) 5-325 MG tablet, Take 1 tablet by mouth every 6 (six) hours as needed for severe pain., Disp: 8 tablet, Rfl: 0 .  PRESCRIPTION MEDICATION, 5 mLs See admin instructions. 50 units Heparin sodium flush, Disp: , Rfl:  .  sodium chloride 0.9 % infusion, Uses for TPN, Disp: , Rfl:  .  Teduglutide,  rDNA, (GATTEX) 5 MG KIT, Inject 3.3 Units into the skin daily., Disp: 1 kit, Rfl: 11 .  vitamin E 400 UNIT capsule, Take 400 Units by mouth daily. , Disp: , Rfl:  .  XARELTO 20 MG TABS tablet, TAKE 1 TABLET (20 MG TOTAL) BY MOUTH DAILY WITH SUPPER., Disp: 90 tablet, Rfl: 2  Allergies  Allergen Reactions  . Penicillins Itching, Swelling and Other (See Comments)    Reaction:  Facial swelling Has patient had a PCN reaction causing immediate rash, facial/tongue/throat swelling, SOB or lightheadedness with hypotension: Yes Has patient had a PCN reaction causing severe rash involving mucus membranes or skin necrosis: No Has patient had a PCN reaction that required hospitalization No Has patient had a PCN reaction occurring within the last 10 years: No If all of the above answers are "NO", then may proceed with Cephalosporin use.    Objective:   LMP 02/26/2014  AAOx3, NAD NCAT, EOMI No obvious CN deficits Coloring WNL Pt is able to speak clearly, coherently without shortness of breath or increased work of breathing.  Thought process is linear.  Mood is appropriate.   Assessment and Plan:   Maxillary sinusitis- Pt has hx of similar and given her immunocompromised state I have a low threshold to prescribe abx.  Start Doxy due to PCN allergy.  Reviewed supportive care and red flags that should prompt return.  Pt expressed understanding and is in agreement w/ plan.    Annye Asa, MD 10/28/2019

## 2019-10-31 ENCOUNTER — Other Ambulatory Visit: Payer: Self-pay | Admitting: Internal Medicine

## 2019-10-31 ENCOUNTER — Telehealth: Payer: Self-pay

## 2019-10-31 DIAGNOSIS — K912 Postsurgical malabsorption, not elsewhere classified: Secondary | ICD-10-CM

## 2019-10-31 NOTE — Telephone Encounter (Signed)
Per Dr Carlean Purl a Vitamin B12 and a selenium level orders have been placed per the request of [facility]. Patient to come late June to have other standing labs drawn with this.

## 2019-11-15 ENCOUNTER — Other Ambulatory Visit (INDEPENDENT_AMBULATORY_CARE_PROVIDER_SITE_OTHER): Payer: Medicare Other

## 2019-11-15 DIAGNOSIS — K912 Postsurgical malabsorption, not elsewhere classified: Secondary | ICD-10-CM

## 2019-11-15 DIAGNOSIS — K90829 Short bowel syndrome, unspecified: Secondary | ICD-10-CM

## 2019-11-15 LAB — CBC WITH DIFFERENTIAL/PLATELET
Basophils Absolute: 0.1 10*3/uL (ref 0.0–0.1)
Basophils Relative: 1.1 % (ref 0.0–3.0)
Eosinophils Absolute: 0.4 10*3/uL (ref 0.0–0.7)
Eosinophils Relative: 6.8 % — ABNORMAL HIGH (ref 0.0–5.0)
HCT: 31.4 % — ABNORMAL LOW (ref 36.0–46.0)
Hemoglobin: 10.3 g/dL — ABNORMAL LOW (ref 12.0–15.0)
Lymphocytes Relative: 22.9 % (ref 12.0–46.0)
Lymphs Abs: 1.2 10*3/uL (ref 0.7–4.0)
MCHC: 32.6 g/dL (ref 30.0–36.0)
MCV: 99.5 fl (ref 78.0–100.0)
Monocytes Absolute: 0.7 10*3/uL (ref 0.1–1.0)
Monocytes Relative: 12.8 % — ABNORMAL HIGH (ref 3.0–12.0)
Neutro Abs: 3.1 10*3/uL (ref 1.4–7.7)
Neutrophils Relative %: 56.4 % (ref 43.0–77.0)
Platelets: 133 10*3/uL — ABNORMAL LOW (ref 150.0–400.0)
RBC: 3.16 Mil/uL — ABNORMAL LOW (ref 3.87–5.11)
RDW: 15.9 % — ABNORMAL HIGH (ref 11.5–15.5)
WBC: 5.4 10*3/uL (ref 4.0–10.5)

## 2019-11-15 LAB — C-REACTIVE PROTEIN: CRP: 1.2 mg/dL (ref 0.5–20.0)

## 2019-11-15 LAB — COMPREHENSIVE METABOLIC PANEL
ALT: 18 U/L (ref 0–35)
AST: 18 U/L (ref 0–37)
Albumin: 3.4 g/dL — ABNORMAL LOW (ref 3.5–5.2)
Alkaline Phosphatase: 102 U/L (ref 39–117)
BUN: 19 mg/dL (ref 6–23)
CO2: 23 mEq/L (ref 19–32)
Calcium: 8.7 mg/dL (ref 8.4–10.5)
Chloride: 109 mEq/L (ref 96–112)
Creatinine, Ser: 0.82 mg/dL (ref 0.40–1.20)
GFR: 69.01 mL/min (ref >60.00)
Glucose, Bld: 80 mg/dL (ref 70–99)
Potassium: 3.8 mEq/L (ref 3.5–5.1)
Sodium: 138 mEq/L (ref 135–145)
Total Bilirubin: 0.4 mg/dL (ref 0.2–1.2)
Total Protein: 7.2 g/dL (ref 6.0–8.3)

## 2019-11-15 LAB — MAGNESIUM: Magnesium: 1.8 mg/dL (ref 1.5–2.5)

## 2019-11-15 LAB — VITAMIN B12: Vitamin B-12: 635 pg/mL (ref 211–911)

## 2019-11-15 LAB — PHOSPHORUS: Phosphorus: 3.1 mg/dL (ref 2.3–4.6)

## 2019-11-17 LAB — PREALBUMIN: Prealbumin: 18 mg/dL (ref 17–34)

## 2019-11-17 LAB — SELENIUM SERUM: Selenium, Blood: 70 mcg/L (ref 63–160)

## 2019-11-24 DIAGNOSIS — M25511 Pain in right shoulder: Secondary | ICD-10-CM | POA: Diagnosis not present

## 2019-12-01 ENCOUNTER — Telehealth: Payer: Self-pay | Admitting: Family Medicine

## 2019-12-01 NOTE — Telephone Encounter (Signed)
Please advise, pt has not been seen in a month

## 2019-12-01 NOTE — Telephone Encounter (Signed)
Scheduled.  Patient notified.  

## 2019-12-01 NOTE — Telephone Encounter (Signed)
Is she taking any daily Claritin or Zyrtec?  If not, she needs to start.  If sxs aren't improving or worsening, she needs a visit.  She may also need to consider COVID testing.

## 2019-12-01 NOTE — Telephone Encounter (Signed)
Spoke with pt. She is concerned about many things, including test from outside provider that show she may have some inflammation and elevated numbers that could be the cause of her sinus infection feelings. Pt is taking Claritin D. I offered her the Office Visit for tomorrow at 4pm. Pt is ok with either VV or in office.

## 2019-12-01 NOTE — Telephone Encounter (Signed)
I blocked the 4pm. Can you schedule pt and then just tell her it will be virtual.

## 2019-12-01 NOTE — Telephone Encounter (Signed)
Patient states that she is still congested and coughing.  Would like to know if you think that she needs to be seen again (virtual) or if there is something you can recommend that she can take.  Please advise

## 2019-12-01 NOTE — Telephone Encounter (Signed)
Alfred for virtual visit tomorrow at 4pm to reassess her sinus sxs and discuss her concerns

## 2019-12-02 ENCOUNTER — Other Ambulatory Visit: Payer: Self-pay

## 2019-12-02 ENCOUNTER — Telehealth (INDEPENDENT_AMBULATORY_CARE_PROVIDER_SITE_OTHER): Payer: Medicare Other | Admitting: Family Medicine

## 2019-12-02 ENCOUNTER — Encounter: Payer: Self-pay | Admitting: Family Medicine

## 2019-12-02 DIAGNOSIS — J309 Allergic rhinitis, unspecified: Secondary | ICD-10-CM

## 2019-12-02 DIAGNOSIS — M810 Age-related osteoporosis without current pathological fracture: Secondary | ICD-10-CM

## 2019-12-02 MED ORDER — CETIRIZINE HCL 10 MG PO TABS
10.0000 mg | ORAL_TABLET | Freq: Every day | ORAL | 11 refills | Status: DC
Start: 2019-12-02 — End: 2020-08-13

## 2019-12-02 MED ORDER — FLUTICASONE PROPIONATE 50 MCG/ACT NA SUSP
2.0000 | Freq: Every day | NASAL | 6 refills | Status: DC
Start: 2019-12-02 — End: 2020-05-31

## 2019-12-02 NOTE — Progress Notes (Signed)
I have discussed the procedure for the virtual visit with the patient who has given consent to proceed with assessment and treatment.   Pt unable to obtain vitals.   [name] [name] L Erasmus Bistline, CMA     

## 2019-12-02 NOTE — Progress Notes (Signed)
Virtual Visit via Video   I connected with patient on 12/02/19 at  4:00 PM EDT by a video enabled telemedicine application and verified that I am speaking with the correct person using two identifiers.  Location patient: Home Location provider: Acupuncturist, Office Persons participating in the virtual visit: Patient, Provider, [place] (Jess B)  I discussed the limitations of evaluation and management by telemedicine and the availability of in person appointments. The patient expressed understanding and agreed to proceed.  Subjective:   HPI:   Sinusitis- pt was seen on 6/18 for a sinus infxn and tx'd w/ Doxycycline.  'Ive still got this lingering cough and my ears are stopped up and my nose is running'.  Pt reports otherwise feeling well.  Denies fevers, chills, HA, N/V.  COVID test yesterday was (-)  Osteoporosis- due for repeat DEXA.  Pt has not been on medication recently- previously on Reclast.    ROS:   See pertinent positives and negatives per HPI.  Patient Active Problem List   Diagnosis Date Noted  . 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 Hypotension 12/30/2017  . Bradycardia 12/30/2017  . 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 Degenerative joint disease 07/08/2016  . PICC line infection 05/16/2015  . 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 Splenomegaly   . Hx of adenomatous colonic polyps 04/18/2012  . Chronic systolic heart failure ([place]) 12/25/2011  . Osteoporosis 03/14/2010  . Anemia of chronic disease 10/01/2009  . GERD (gastroesophageal reflux disease) 11/19/2008  . ALKALINE PHOSPHATASE, ELEVATED 10/12/2008  . VITAMIN D DEFICIENCY 01/19/2008  . TRANSAMINASES, SERUM, ELEVATED 01/19/2008  . Bacterial overgrowth syndrome 11/19/2004  . Acquired short bowel syndrome 11/20/2003    Social History   Tobacco Use  . Smoking status: Former Smoker    Types: Cigarettes    Quit date:  05/12/2004    Years since quitting: 15.5  . Smokeless tobacco: Never Used  Substance Use Topics  . Alcohol use: No    Current Outpatient Medications:  .  acetaminophen (TYLENOL) 500 MG tablet, Take 1,000 mg by mouth daily as needed for mild pain., Disp: , Rfl:  .  ADULT TPN, 1,800 mLs 4 (four) times a week. Pt receives home TPN from [facility]:  1800 mL bag, four nights weekly (Monday, Tuesday, Wednesday, Thursday for 8 hours (includes 1 hour taper up and down)., Disp: , Rfl:  .  albuterol (VENTOLIN HFA) 108 (90 Base) MCG/ACT inhaler, TAKE 2 PUFFS BY MOUTH EVERY 6 HOURS AS NEEDED FOR WHEEZE OR SHORTNESS OF BREATH, Disp: 18 g, Rfl: 2 .  clobetasol ointment (TEMOVATE) 8.00 %, Apply 1 application topically 2 (two) times daily., Disp: 30 g, Rfl: 0 .  diphenhydrAMINE (BENADRYL) 25 mg capsule, Take 25-50 mg by mouth every 6 (six) hours as needed for itching or sleep. , Disp: , Rfl:  .  diphenoxylate-atropine (LOMOTIL) 2.5-0.025 MG tablet, Take 1 tablet by mouth 4 (four) times daily as needed for diarrhea or loose stools., Disp: 90 tablet, Rfl: 5 .  methocarbamol (ROBAXIN) 500 MG tablet, Take 500 mg by mouth every 8 (eight) hours as needed for muscle spasms., Disp: , Rfl:  .  Multiple Vitamin (MULTIVITAMIN WITH MINERALS) TABS, Take 1 tablet by mouth daily., Disp: , Rfl:  .  omeprazole (PRILOSEC) 40 MG capsule, TAKE 1 CAPSULE 2 (TWO) TIMES DAILY BY MOUTH. TAKE BEFORE BREAKFAST AND SUPPER, Disp: 180 capsule, Rfl: 1 .  ondansetron (ZOFRAN-ODT) 8 MG disintegrating  tablet, Take 1 tablet (8 mg total) by mouth every 8 (eight) hours as needed for nausea or vomiting., Disp: 30 tablet, Rfl: 11 .  PRESCRIPTION MEDICATION, 5 mLs See admin instructions. 50 units Heparin sodium flush, Disp: , Rfl:  .  sodium chloride 0.9 % infusion, Uses for TPN, Disp: , Rfl:  .  Teduglutide, rDNA, (GATTEX) 5 MG KIT, Inject 3.3 Units into the skin daily., Disp: 1 kit, Rfl: 11 .  vitamin E 400 UNIT capsule, Take 400 Units by mouth  daily. , Disp: , Rfl:  .  XARELTO 20 MG TABS tablet, TAKE 1 TABLET (20 MG TOTAL) BY MOUTH DAILY WITH SUPPER., Disp: 90 tablet, Rfl: 2  Allergies  Allergen Reactions  . Penicillins Itching, Swelling and Other (See Comments)    Reaction:  Facial swelling Has patient had a PCN reaction causing immediate rash, facial/tongue/throat swelling, SOB or lightheadedness with hypotension: Yes Has patient had a PCN reaction causing severe rash involving mucus membranes or skin necrosis: No Has patient had a PCN reaction that required hospitalization No Has patient had a PCN reaction occurring within the last 10 years: No If all of the above answers are "NO", then may proceed with Cephalosporin use.    Objective:   LMP 02/26/2014  AAOx3, NAD NCAT, EOMI No obvious CN deficits Coloring WNL Pt is able to speak clearly, coherently without shortness of breath or increased work of breathing.  Thought process is linear.  Mood is appropriate.   Assessment and Plan:   Allergic sinusitis- new.  Pt is well appearing and has no sxs or physical evidence of bacterial infxn.  Suspect this is all allergy related.  Start daily nasal steroid spray and add daily antihistamine.  Pt expressed understanding and is in agreement w/ plan.    Annye Asa, MD 12/02/2019

## 2019-12-02 NOTE — Assessment & Plan Note (Signed)
Pt has had multiple fxs.  Previously on Reclast.  Overdue for DEXA.  Ordered

## 2019-12-05 ENCOUNTER — Other Ambulatory Visit: Payer: Self-pay | Admitting: Internal Medicine

## 2019-12-06 ENCOUNTER — Telehealth: Payer: Self-pay

## 2019-12-06 DIAGNOSIS — I779 Disorder of arteries and arterioles, unspecified: Secondary | ICD-10-CM

## 2019-12-06 NOTE — Telephone Encounter (Signed)
Called patient about carotid duplex. Scheduled patient for office visit since she is pasted due. Informed patient someone would call to schedule carotid duplex. Patient verbalized understanding.

## 2019-12-06 NOTE — Telephone Encounter (Signed)
-----   Message from Michaelyn Barter, RN sent at 01/05/2018 11:15 AM EDT ----- Needs carotid August 2021

## 2019-12-12 ENCOUNTER — Other Ambulatory Visit: Payer: Self-pay

## 2019-12-12 ENCOUNTER — Other Ambulatory Visit (INDEPENDENT_AMBULATORY_CARE_PROVIDER_SITE_OTHER): Payer: Medicare Other

## 2019-12-12 DIAGNOSIS — K912 Postsurgical malabsorption, not elsewhere classified: Secondary | ICD-10-CM | POA: Diagnosis not present

## 2019-12-12 LAB — COMPREHENSIVE METABOLIC PANEL
ALT: 16 U/L (ref 0–35)
AST: 18 U/L (ref 0–37)
Albumin: 3.5 g/dL (ref 3.5–5.2)
Alkaline Phosphatase: 103 U/L (ref 39–117)
BUN: 11 mg/dL (ref 6–23)
CO2: 22 mEq/L (ref 19–32)
Calcium: 8.9 mg/dL (ref 8.4–10.5)
Chloride: 110 mEq/L (ref 96–112)
Creatinine, Ser: 0.94 mg/dL (ref 0.40–1.20)
GFR: 58.94 mL/min — ABNORMAL LOW (ref >60.00)
Glucose, Bld: 78 mg/dL (ref 70–99)
Potassium: 3.4 mEq/L — ABNORMAL LOW (ref 3.5–5.1)
Sodium: 138 mEq/L (ref 135–145)
Total Bilirubin: 0.5 mg/dL (ref 0.2–1.2)
Total Protein: 7.6 g/dL (ref 6.0–8.3)

## 2019-12-12 LAB — CBC
HCT: 33.9 % — ABNORMAL LOW (ref 36.0–46.0)
Hemoglobin: 11 g/dL — ABNORMAL LOW (ref 12.0–15.0)
MCHC: 32.6 g/dL (ref 30.0–36.0)
MCV: 99.8 fl (ref 78.0–100.0)
Platelets: 131 10*3/uL — ABNORMAL LOW (ref 150.0–400.0)
RBC: 3.4 Mil/uL — ABNORMAL LOW (ref 3.87–5.11)
RDW: 14.7 % (ref 11.5–15.5)
WBC: 4.7 10*3/uL (ref 4.0–10.5)

## 2019-12-12 LAB — PHOSPHORUS: Phosphorus: 4.1 mg/dL (ref 2.3–4.6)

## 2019-12-12 LAB — MAGNESIUM: Magnesium: 1.7 mg/dL (ref 1.5–2.5)

## 2019-12-12 LAB — VITAMIN B12: Vitamin B-12: 430 pg/mL (ref 211–911)

## 2019-12-12 LAB — C-REACTIVE PROTEIN: CRP: <1 mg/dL (ref 0.5–20.0)

## 2019-12-13 LAB — PREALBUMIN: Prealbumin: 18 mg/dL (ref 17–34)

## 2019-12-15 ENCOUNTER — Telehealth: Payer: Self-pay | Admitting: Internal Medicine

## 2019-12-15 DIAGNOSIS — M25619 Stiffness of unspecified shoulder, not elsewhere classified: Secondary | ICD-10-CM | POA: Diagnosis not present

## 2019-12-15 DIAGNOSIS — M25511 Pain in right shoulder: Secondary | ICD-10-CM | POA: Diagnosis not present

## 2019-12-15 NOTE — Telephone Encounter (Signed)
Left a message for [name] [name] letting her know that I was returning her call in regards to mutual patient, I let her know that I am covering for [facility] as she is out of the office at this time. Advised her to give me a call back when she could.

## 2019-12-20 ENCOUNTER — Other Ambulatory Visit: Payer: Self-pay | Admitting: Internal Medicine

## 2019-12-20 DIAGNOSIS — K912 Postsurgical malabsorption, not elsewhere classified: Secondary | ICD-10-CM

## 2019-12-20 DIAGNOSIS — K90829 Short bowel syndrome, unspecified: Secondary | ICD-10-CM

## 2019-12-20 DIAGNOSIS — M25511 Pain in right shoulder: Secondary | ICD-10-CM | POA: Diagnosis not present

## 2019-12-20 DIAGNOSIS — M25619 Stiffness of unspecified shoulder, not elsewhere classified: Secondary | ICD-10-CM | POA: Diagnosis not present

## 2019-12-20 NOTE — Telephone Encounter (Signed)
Maria sent a hard copy with her questions and concerns regarding patient. Document placed in Dr. Celesta Aver in box for review.

## 2019-12-22 DIAGNOSIS — M25511 Pain in right shoulder: Secondary | ICD-10-CM | POA: Diagnosis not present

## 2019-12-22 DIAGNOSIS — M25619 Stiffness of unspecified shoulder, not elsewhere classified: Secondary | ICD-10-CM | POA: Diagnosis not present

## 2019-12-22 DIAGNOSIS — S42201D Unspecified fracture of upper end of right humerus, subsequent encounter for fracture with routine healing: Secondary | ICD-10-CM | POA: Diagnosis not present

## 2019-12-26 DIAGNOSIS — M25511 Pain in right shoulder: Secondary | ICD-10-CM | POA: Diagnosis not present

## 2019-12-26 DIAGNOSIS — M25619 Stiffness of unspecified shoulder, not elsewhere classified: Secondary | ICD-10-CM | POA: Diagnosis not present

## 2019-12-29 DIAGNOSIS — M25511 Pain in right shoulder: Secondary | ICD-10-CM | POA: Diagnosis not present

## 2019-12-29 DIAGNOSIS — M25619 Stiffness of unspecified shoulder, not elsewhere classified: Secondary | ICD-10-CM | POA: Diagnosis not present

## 2020-01-02 ENCOUNTER — Other Ambulatory Visit: Payer: Self-pay

## 2020-01-02 ENCOUNTER — Encounter: Payer: Self-pay | Admitting: Physician Assistant

## 2020-01-02 ENCOUNTER — Ambulatory Visit (INDEPENDENT_AMBULATORY_CARE_PROVIDER_SITE_OTHER): Payer: Medicare Other | Admitting: Physician Assistant

## 2020-01-02 ENCOUNTER — Ambulatory Visit (HOSPITAL_BASED_OUTPATIENT_CLINIC_OR_DEPARTMENT_OTHER)
Admission: RE | Admit: 2020-01-02 | Discharge: 2020-01-02 | Disposition: A | Discharge status: Home / Self Care | Payer: Medicare Other | Source: Ambulatory Visit | Attending: Physician Assistant | Admitting: Physician Assistant

## 2020-01-02 VITALS — BP 112/70 | HR 65 | Temp 97.6°F | Resp 16 | Ht 68.0 in | Wt 138.0 lb

## 2020-01-02 DIAGNOSIS — I82C11 Acute embolism and thrombosis of right internal jugular vein: Secondary | ICD-10-CM | POA: Diagnosis not present

## 2020-01-02 DIAGNOSIS — I82401 Acute embolism and thrombosis of unspecified deep veins of right lower extremity: Secondary | ICD-10-CM | POA: Diagnosis not present

## 2020-01-02 DIAGNOSIS — S42214S Unspecified nondisplaced fracture of surgical neck of right humerus, sequela: Secondary | ICD-10-CM | POA: Diagnosis not present

## 2020-01-02 DIAGNOSIS — R2231 Localized swelling, mass and lump, right upper limb: Secondary | ICD-10-CM

## 2020-01-02 NOTE — Patient Instructions (Signed)
Please keep arm wrapped. Elevate while at home. Would stop PT for now until we get [place]. Need to rule out any clot giving history. Suspect likely lymphedema from fracture but need further assessment. If [place] negative will be getting you back in with Ortho for further management of lymphedema 2/2 humeral fracture.

## 2020-01-02 NOTE — Progress Notes (Signed)
Patient presents to clinic today c/o swelling and skin changes of her right forearm over the past several weeks since she sustained a right humeral fracture.  Patient endorses she was seen by [facility] after the accident, having her shoulder casted.  Is currently undergoing physical therapy with improvement in range of motion and pain levels.  Has noticed mild swelling since the fracture with slight worsening of this over the past couple of weeks.  Notes dimpling in her skin with a " pop rocks" texture. Denies pain or tenderness of skin. Denies redness or warmth.   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   Fracture of left clavicle    History of blood transfusion 2013   anemia   Hx of cardiovascular stress test    Myoview (9/15):  inf-apical scar; no ischemia; EF 47% - low risk    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 12/12/2011   Osteoporosis    Pancytopenia 10/07/2011   Pathologic fracture of neck of femur ([place])    Personal history of colonic polyps    Renal insufficiency    hx of yrs ago   Serratia marcescens infection - bactermia assoc w/ PICC 01/18/2015   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     ADULT TPN 1,800 mLs 4 (four) times a week. Pt receives home TPN from [facility]:  1800 mL bag, four nights weekly (Monday, Tuesday, Wednesday, Thursday for 8 hours (includes 1 hour taper up and down).     albuterol (VENTOLIN HFA) 108 (90 Base) MCG/ACT inhaler TAKE 2 PUFFS BY MOUTH EVERY 6 HOURS AS NEEDED FOR WHEEZE OR SHORTNESS OF BREATH 18 g 2   cetirizine (ZYRTEC) 10 MG tablet Take 1 tablet (10 mg total) by mouth daily. 30 tablet 11   clobetasol ointment (TEMOVATE) 8.58 % Apply 1 application topically 2 (two) times daily. 30 g 0   diphenhydrAMINE (BENADRYL) 25 mg capsule Take 25-50 mg by mouth every 6 (six) hours as needed for itching or sleep.      diphenoxylate-atropine (LOMOTIL) 2.5-0.025 MG tablet Take 1 tablet by mouth 4 (four) times daily as needed for diarrhea or loose stools. 90 tablet 5   fluticasone (FLONASE) 50 MCG/ACT nasal spray Place 2 sprays into both nostrils daily. 16 g 6   methocarbamol (ROBAXIN) 500 MG tablet Take 500 mg by mouth every 8 (eight) hours as needed for muscle spasms.     Multiple Vitamin (MULTIVITAMIN WITH MINERALS) TABS Take 1 tablet by mouth daily.  omeprazole (PRILOSEC) 40 MG capsule TAKE 1 CAPSULE 2 (TWO) TIMES DAILY BY MOUTH. TAKE BEFORE BREAKFAST AND SUPPER 180 capsule 1   ondansetron (ZOFRAN-ODT) 8 MG disintegrating tablet TAKE 1 TABLET (8 MG TOTAL) BY MOUTH EVERY 8 (EIGHT) HOURS AS NEEDED FOR NAUSEA OR VOMITING. 30 tablet 11   PRESCRIPTION MEDICATION 5 mLs See admin instructions. 50 units Heparin sodium flush     sodium chloride 0.9 % infusion Uses for TPN     Teduglutide, rDNA, (GATTEX) 5 MG KIT Inject 3.3 Units into the skin daily. 1 kit 11   vitamin E 400 UNIT capsule Take 400 Units by mouth daily.      XARELTO 20 MG TABS tablet TAKE 1 TABLET (20 MG TOTAL) BY MOUTH DAILY WITH SUPPER. 90 tablet 2   No current facility-administered medications  on file prior to visit.    Allergies  Allergen Reactions   Penicillins Itching, Swelling and Other (See Comments)    Reaction:  Facial swelling Has patient had a PCN reaction causing immediate rash, facial/tongue/throat swelling, SOB or lightheadedness with hypotension: Yes Has patient had a PCN reaction causing severe rash involving mucus membranes or skin necrosis: No Has patient had a PCN reaction that required hospitalization No Has patient had a PCN reaction occurring within the last 10 years: No If all of the above answers are "NO", then may proceed with Cephalosporin use.    Family History  Problem Relation Age of Onset   Diabetes Mother    Hypertension Mother    AAA (abdominal aortic aneurysm) Mother    Colon cancer Neg Hx    Stomach cancer Neg Hx     Social History   Socioeconomic History   Marital status: Married    Spouse name: Not on file   Number of children: Not on file   Years of education: Not on file   Highest education level: Not on file  Occupational History   Not on file  Tobacco Use   Smoking status: Former Smoker    Types: Cigarettes    Quit date: 05/12/2004    Years since quitting: 15.6   Smokeless tobacco: Never Used  Scientific laboratory technician Use: Never used  Substance and Sexual Activity   Alcohol use: No   Drug use: No   Sexual activity: Yes    Comment: 1st intercourse 61 yo-5 partners  Other Topics Concern   Not on file  Social History Narrative   Married to [place], has 1 daughter and a granddaughter the patient's son died in childhood due to a motor vehicle wreck   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   No alcohol tobacco or drug use   04/07/2017   Social Determinants of Health   Financial Resource Strain:    Difficulty of Paying Living Expenses: Not on file  Food Insecurity:    Worried About Charity fundraiser in the Last Year: Not on file   [employer] of Food in the Last  Year: Not on file  Transportation Needs:    Lack of Transportation (Medical): Not on file   Lack of Transportation (Non-Medical): Not on file  Physical Activity:    Days of Exercise per Week: Not on file   Minutes of Exercise per Session: Not on file  Stress:    Feeling of Stress : Not on file  Social Connections:    Frequency of Communication with Friends and Family: Not on file   Frequency  of Social Gatherings with Friends and Family: Not on file   Attends Religious Services: Not on file   Active Member of Clubs or Organizations: Not on file   Attends Archivist Meetings: Not on file   Marital Status: Not on file   Review of Systems - See HPI.  All other ROS are negative.  BP 112/70    Pulse 65    Temp 97.6 F (36.4 C) (Temporal)    Resp 16    Ht $R'5\' 8"'mV$  (1.727 m)    Wt 138 lb (62.6 kg)    LMP 02/26/2014    SpO2 96%    BMI 20.98 kg/m   Physical Exam Vitals reviewed.  Constitutional:      Appearance: Normal appearance.  HENT:     Head: Normocephalic and atraumatic.  Cardiovascular:     Rate and Rhythm: Normal rate and regular rhythm.     Pulses: Normal pulses.          Radial pulses are 2+ on the right side and 2+ on the left side.     Heart sounds: Normal heart sounds.  Pulmonary:     Effort: Pulmonary effort is normal.     Breath sounds: Normal breath sounds.  Musculoskeletal:     Right forearm: Edema (With some dimpling of skin.  No pitting edema.  Seems most consistent with lymphedema.) present.     Cervical back: Neck supple.  Lymphadenopathy:     Head:     Right side of head: No submental, submandibular or tonsillar adenopathy.     Left side of head: No submental, submandibular or tonsillar adenopathy.     Cervical: No cervical adenopathy.     Right cervical: No superficial, deep or posterior cervical adenopathy.    Left cervical: No superficial, deep or posterior cervical adenopathy.     Upper Body:     Right upper body: No supraclavicular,  axillary or epitrochlear adenopathy.     Left upper body: No supraclavicular adenopathy.  Neurological:     General: No focal deficit present.     Mental Status: She is alert and oriented to person, place, and time.  Psychiatric:        Mood and Affect: Mood normal.     Recent Results (from the past 2160 hour(s))  Selenium serum     Status: None   Collection Time: 11/15/19 10:09 AM  Result Value Ref Range   Selenium, Blood 70 63 - 160 mcg/L    Comment: . This test was developed and its analytical performance characteristics have been determined by [place], [place]. It has not been cleared or approved by the U.S. Food and Drug Administration. This assay has been validated pursuant to the CLIA regulations and is used for clinical purposes. .   Vitamin B12     Status: None   Collection Time: 11/15/19 10:09 AM  Result Value Ref Range   Vitamin B-12 635 211 - 911 pg/mL  Prealbumin     Status: None   Collection Time: 11/15/19 10:09 AM  Result Value Ref Range   Prealbumin 18 17 - 34 mg/dL  Phosphorus     Status: None   Collection Time: 11/15/19 10:09 AM  Result Value Ref Range   Phosphorus 3.1 2.3 - 4.6 mg/dL  Comprehensive metabolic panel     Status: Abnormal   Collection Time: 11/15/19 10:09 AM  Result Value Ref Range   Sodium 138 135 - 145 mEq/L  Potassium 3.8 3.5 - 5.1 mEq/L   Chloride 109 96 - 112 mEq/L   CO2 23 19 - 32 mEq/L   Glucose, Bld 80 70 - 99 mg/dL   BUN 19 6 - 23 mg/dL   Creatinine, Ser 0.82 0.40 - 1.20 mg/dL   Total Bilirubin 0.4 0.2 - 1.2 mg/dL   Alkaline Phosphatase 102 39 - 117 U/L   AST 18 0 - 37 U/L   ALT 18 0 - 35 U/L   Total Protein 7.2 6.0 - 8.3 g/dL   Albumin 3.4 (L) 3.5 - 5.2 g/dL   GFR 69.01 >60.00 mL/min   Calcium 8.7 8.4 - 10.5 mg/dL  Magnesium     Status: None   Collection Time: 11/15/19 10:09 AM  Result Value Ref Range   Magnesium 1.8 1.5 - 2.5 mg/dL  C-reactive protein     Status: None   Collection  Time: 11/15/19 10:09 AM  Result Value Ref Range   CRP 1.2 0.5 - 20.0 mg/dL  CBC with Differential/Platelet     Status: Abnormal   Collection Time: 11/15/19 10:09 AM  Result Value Ref Range   WBC 5.4 4.0 - 10.5 K/uL   RBC 3.16 (L) 3.87 - 5.11 Mil/uL   Hemoglobin 10.3 (L) 12.0 - 15.0 g/dL   HCT 31.4 (L) 36 - 46 %   MCV 99.5 78.0 - 100.0 fl   MCHC 32.6 30.0 - 36.0 g/dL   RDW 15.9 (H) 11.5 - 15.5 %   Platelets 133.0 (L) 150 - 400 K/uL   Neutrophils Relative % 56.4 43 - 77 %   Lymphocytes Relative 22.9 12 - 46 %   Monocytes Relative 12.8 (H) 3 - 12 %   Eosinophils Relative 6.8 (H) 0 - 5 %   Basophils Relative 1.1 0 - 3 %   Neutro Abs 3.1 1.4 - 7.7 K/uL   Lymphs Abs 1.2 0.7 - 4.0 K/uL   Monocytes Absolute 0.7 0 - 1 K/uL   Eosinophils Absolute 0.4 0 - 0 K/uL   Basophils Absolute 0.1 0 - 0 K/uL  B12     Status: None   Collection Time: 12/12/19  9:48 AM  Result Value Ref Range   Vitamin B-12 430 211 - 911 pg/mL  Phosphorus     Status: None   Collection Time: 12/12/19  9:48 AM  Result Value Ref Range   Phosphorus 4.1 2.3 - 4.6 mg/dL  Magnesium     Status: None   Collection Time: 12/12/19  9:48 AM  Result Value Ref Range   Magnesium 1.7 1.5 - 2.5 mg/dL  C-reactive protein     Status: None   Collection Time: 12/12/19  9:48 AM  Result Value Ref Range   CRP <1.0 0.5 - 20.0 mg/dL  Prealbumin     Status: None   Collection Time: 12/12/19  9:48 AM  Result Value Ref Range   Prealbumin 18 17 - 34 mg/dL  Comprehensive metabolic panel     Status: Abnormal   Collection Time: 12/12/19  9:48 AM  Result Value Ref Range   Sodium 138 135 - 145 mEq/L   Potassium 3.4 (L) 3.5 - 5.1 mEq/L   Chloride 110 96 - 112 mEq/L   CO2 22 19 - 32 mEq/L   Glucose, Bld 78 70 - 99 mg/dL   BUN 11 6 - 23 mg/dL   Creatinine, Ser 0.94 0.40 - 1.20 mg/dL   Total Bilirubin 0.5 0.2 - 1.2 mg/dL   Alkaline Phosphatase 103 39 -  117 U/L   AST 18 0 - 37 U/L   ALT 16 0 - 35 U/L   Total Protein 7.6 6.0 - 8.3 g/dL    Albumin 3.5 3.5 - 5.2 g/dL   GFR 58.94 (L) >60.00 mL/min   Calcium 8.9 8.4 - 10.5 mg/dL  CBC     Status: Abnormal   Collection Time: 12/12/19  9:48 AM  Result Value Ref Range   WBC 4.7 4.0 - 10.5 K/uL   RBC 3.40 (L) 3.87 - 5.11 Mil/uL   Platelets 131.0 (L) 150 - 400 K/uL   Hemoglobin 11.0 (L) 12.0 - 15.0 g/dL   HCT 33.9 (L) 36 - 46 %   MCV 99.8 78.0 - 100.0 fl   MCHC 32.6 30.0 - 36.0 g/dL   RDW 14.7 11.5 - 15.5 %    Assessment/Plan: 1. Localized swelling of right upper extremity 2. Closed nondisplaced fracture of surgical neck of right humerus, unspecified fracture morphology, sequela Suspect secondary to her humeral fracture.  Mild lymphedema of forearm without any proximal swelling.  Sensation intact.  Range of motion of hand, wrist and elbow within normal limits.  Shoulder range of motion slightly limited secondary to pain from her fracture.  Will obtain ultrasound to rule out any clot.  Start compression.  Ace wrap provided.  If ultrasound negative we will have her follow-up with her orthopedist for further evaluation and management. - US Venous Img Upper Uni Right(DVT); Future    This visit occurred during the SARS-CoV-2 public health emergency.  Safety protocols were in place, including screening questions prior to the visit, additional usage of staff PPE, and extensive cleaning of exam room while observing appropriate contact time as indicated for disinfecting solutions.     Leeanne Rio, PA-C

## 2020-01-03 ENCOUNTER — Other Ambulatory Visit (HOSPITAL_BASED_OUTPATIENT_CLINIC_OR_DEPARTMENT_OTHER): Payer: Medicare Other

## 2020-01-04 ENCOUNTER — Other Ambulatory Visit: Payer: Self-pay | Admitting: Emergency Medicine

## 2020-01-04 DIAGNOSIS — I82C11 Acute embolism and thrombosis of right internal jugular vein: Secondary | ICD-10-CM

## 2020-01-04 DIAGNOSIS — S42214S Unspecified nondisplaced fracture of surgical neck of right humerus, sequela: Secondary | ICD-10-CM

## 2020-01-04 DIAGNOSIS — R2231 Localized swelling, mass and lump, right upper limb: Secondary | ICD-10-CM

## 2020-01-09 ENCOUNTER — Ambulatory Visit (INDEPENDENT_AMBULATORY_CARE_PROVIDER_SITE_OTHER)
Admission: RE | Admit: 2020-01-09 | Discharge: 2020-01-09 | Disposition: A | Discharge status: Home / Self Care | Payer: Medicare Other | Source: Ambulatory Visit | Attending: Family Medicine | Admitting: Family Medicine

## 2020-01-09 ENCOUNTER — Other Ambulatory Visit (INDEPENDENT_AMBULATORY_CARE_PROVIDER_SITE_OTHER): Payer: Medicare Other

## 2020-01-09 ENCOUNTER — Other Ambulatory Visit: Payer: Self-pay

## 2020-01-09 DIAGNOSIS — K912 Postsurgical malabsorption, not elsewhere classified: Secondary | ICD-10-CM | POA: Diagnosis not present

## 2020-01-09 DIAGNOSIS — M810 Age-related osteoporosis without current pathological fracture: Secondary | ICD-10-CM | POA: Diagnosis not present

## 2020-01-09 LAB — MAGNESIUM: Magnesium: 1.7 mg/dL (ref 1.5–2.5)

## 2020-01-09 LAB — COMPREHENSIVE METABOLIC PANEL
ALT: 23 U/L (ref 0–35)
AST: 20 U/L (ref 0–37)
Albumin: 3.6 g/dL (ref 3.5–5.2)
Alkaline Phosphatase: 112 U/L (ref 39–117)
BUN: 9 mg/dL (ref 6–23)
CO2: 21 mEq/L (ref 19–32)
Calcium: 8.7 mg/dL (ref 8.4–10.5)
Chloride: 107 mEq/L (ref 96–112)
Creatinine, Ser: 0.94 mg/dL (ref 0.40–1.20)
GFR: 58.92 mL/min — ABNORMAL LOW (ref >60.00)
Glucose, Bld: 76 mg/dL (ref 70–99)
Potassium: 3.3 mEq/L — ABNORMAL LOW (ref 3.5–5.1)
Sodium: 137 mEq/L (ref 135–145)
Total Bilirubin: 0.5 mg/dL (ref 0.2–1.2)
Total Protein: 7.8 g/dL (ref 6.0–8.3)

## 2020-01-09 LAB — CBC
HCT: 35 % — ABNORMAL LOW (ref 36.0–46.0)
Hemoglobin: 11.5 g/dL — ABNORMAL LOW (ref 12.0–15.0)
MCHC: 32.8 g/dL (ref 30.0–36.0)
MCV: 98.9 fl (ref 78.0–100.0)
Platelets: 148 10*3/uL — ABNORMAL LOW (ref 150.0–400.0)
RBC: 3.54 Mil/uL — ABNORMAL LOW (ref 3.87–5.11)
RDW: 14.3 % (ref 11.5–15.5)
WBC: 4.6 10*3/uL (ref 4.0–10.5)

## 2020-01-09 LAB — VITAMIN B12: Vitamin B-12: 490 pg/mL (ref 211–911)

## 2020-01-09 LAB — C-REACTIVE PROTEIN: CRP: <1 mg/dL (ref 0.5–20.0)

## 2020-01-09 LAB — PHOSPHORUS: Phosphorus: 3.8 mg/dL (ref 2.3–4.6)

## 2020-01-10 ENCOUNTER — Ambulatory Visit (INDEPENDENT_AMBULATORY_CARE_PROVIDER_SITE_OTHER): Payer: Medicare Other

## 2020-01-10 ENCOUNTER — Encounter: Payer: Self-pay | Admitting: Orthopaedic Surgery

## 2020-01-10 ENCOUNTER — Ambulatory Visit (INDEPENDENT_AMBULATORY_CARE_PROVIDER_SITE_OTHER): Payer: Medicare Other | Admitting: Orthopaedic Surgery

## 2020-01-10 DIAGNOSIS — M25511 Pain in right shoulder: Secondary | ICD-10-CM

## 2020-01-10 NOTE — Progress Notes (Signed)
Office Visit Note   Patient: [name] [name]           Date of Birth: 01/04/1951           MRN: [id number] Visit Date: 01/10/2020              Requested by: Brunetta Jeans, PA-C [id number] [place],  [place] [id number] PCP: [name], MD   Assessment & Plan: Visit Diagnoses:  1. Right shoulder pain, unspecified chronicity     Plan: From a clinical and radiographic standpoint, it looks like she is healing this fracture and making good progress.  I recommended she start wearing her compressive garment again and work on elevation for the dependent edema.  She will continue her physical therapy and follow-up with Dr. Veverly Fells.  All questions and concerns were answered and addressed.  Follow-Up Instructions: Return if symptoms worsen or fail to improve.   Orders:  Orders Placed This Encounter  Procedures  . XR Shoulder Right   No orders of the defined types were placed in this encounter.     Procedures: No procedures performed   Clinical Data: No additional findings.   Subjective: Chief Complaint  Patient presents with  . Right Arm - Fracture  The patient is a very pleasant 69 year old female who is referred from her primary care physician for evaluation treatment of right upper extremity swelling.  However, she is being followed actively by one of my colleagues in town who is been treating the fracture Dr. Veverly Fells from [facility].  She actually has a follow-up with him with him soon.  There is been concerned about significant right upper extremity swelling.  Even therapy has given her compressive garment for this.  She is at upper extremity Doppler studies to rule out any type of DVT.  She does feel like she is making progress now with the range of motion and decreased pain and said the swelling is gone down quite a bit in her right upper extremity.  Again, she reports that she does have follow-up soon with Dr. Veverly Fells and she has been happy with his care as  well.  Her fracture occurred on June 7. HPI  Review of Systems There is currently no listed headache, chest pain, shortness of breath, fever, chills, nausea, vomiting  Objective: Vital Signs: LMP 02/26/2014   Physical Exam She is alert and orient x3 and in no acute distress Ortho Exam Examination of her right upper extremity does show some dependent edema from the forearm down to the hand and there is some firmness of the forearm but no evidence of compartment syndrome and you can tell the edema has been slowly reducing with time.  Clinically, her right shoulder is very well located.  She has some limited abduction and rotation of the shoulder but she states is even improved from when she had a fracture. Specialty Comments:  No specialty comments available.  Imaging: XR Shoulder Right  Result Date: 01/10/2020 3 views of the right shoulder show a healing right proximal humerus fracture that is at the metaphyseal diaphyseal junction.  Compared to previous films, there is significant healing.    PMFS History: Patient Active Problem List   Diagnosis Date Noted  . 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 Hypotension 12/30/2017  . Bradycardia 12/30/2017  . 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 Degenerative joint disease 07/08/2016  . PICC line infection 05/16/2015  .  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 Splenomegaly   . Hx of adenomatous colonic polyps 04/18/2012  . Chronic systolic heart failure ([place]) 12/25/2011  . Osteoporosis 03/14/2010  . Anemia of chronic disease 10/01/2009  . GERD (gastroesophageal reflux disease) 11/19/2008  . ALKALINE PHOSPHATASE, ELEVATED 10/12/2008  . VITAMIN D DEFICIENCY 01/19/2008  . TRANSAMINASES, SERUM, ELEVATED 01/19/2008  . Bacterial overgrowth syndrome 11/19/2004  . Acquired short bowel syndrome 11/20/2003   Past Medical History:  Diagnosis Date  . Abnormal LFTs 2013    . Allergic rhinosinusitis   . Anemia of chronic disease   . At risk for dental problems   . Atypical nevus   . Bacteremia due to Klebsiella pneumoniae 12/12/2011  . Bacterial overgrowth syndrome   . 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 Carotid stenosis    Carotid US (9/15):  R 40-59%; L 1-39% >> FU 1 year  . Closed left subtrochanteric femur fracture ([place]) 12/31/2017  . Congestive heart failure (CHF) (HCC)    EF 25-30%  . 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 Fracture of left clavicle   . History of blood transfusion 2013   anemia  . Hx of cardiovascular stress test    Myoview (9/15):  inf-apical scar; no ischemia; EF 47% - low risk   . 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 Osteoporosis   . Pancytopenia 10/07/2011  . Pathologic fracture of neck of femur ([place])   . Personal history of colonic polyps   . Renal insufficiency    hx of yrs ago  . Serratia marcescens infection - bactermia assoc w/ PICC 01/18/2015  . 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[place])   . Splenomegaly    By ultrasound  . Thrombophilia ([place])   . Vitamin D deficiency     Family History  Problem Relation Age of Onset  . Diabetes Mother   . Hypertension Mother   . AAA (abdominal aortic aneurysm) Mother   . Colon cancer Neg Hx   . Stomach cancer Neg Hx     Past Surgical History:  Procedure Laterality Date  . APPENDECTOMY  yrs ago  . CHOLECYSTECTOMY  yrs ago  . COLONOSCOPY  12/05/2005   internal hemorrhoids (for polyp surveillance)  . COLONOSCOPY  04/26/2012   Procedure: COLONOSCOPY;  Surgeon: Gatha Mayer, MD;  Location: [facility];  Service: Endoscopy;  Laterality: N/A;  . COLONOSCOPY WITH PROPOFOL N/A 03/18/2016   Procedure: COLONOSCOPY WITH PROPOFOL;  Surgeon: Gatha Mayer, MD;  Location: [facility];  Service: Endoscopy;  Laterality: N/A;  . ESOPHAGOGASTRODUODENOSCOPY  01/22/2009    erosive esophagitis  . HARDWARE REMOVAL Left 12/31/2017   Procedure: HARDWARE REMOVAL;  Surgeon: Rod Can, MD;  Location: [facility];  Service: Orthopedics;  Laterality: Left;  . INTRAMEDULLARY (IM) NAIL INTERTROCHANTERIC Left 12/31/2017   Procedure: INTRAMEDULLARY (IM) NAIL SUBTROCHANTRIC;  Surgeon: Rod Can, MD;  Location: [facility];  Service: Orthopedics;  Laterality: Left;  . IR CV LINE INJECTION  03/23/2018  . IR FLUORO GUIDE CV LINE LEFT  01/01/2018  . IR FLUORO GUIDE CV LINE LEFT  03/24/2018  . IR FLUORO GUIDE CV LINE LEFT  05/21/2018  . IR FLUORO GUIDE CV LINE LEFT  07/09/2018  . IR GENERIC HISTORICAL  07/04/2016   IR REMOVAL TUN CV CATH W/O FL 07/04/2016 Ascencion Dike, PA-C [facility]  . IR GENERIC HISTORICAL  07/10/2016  IR US GUIDE VASC ACCESS LEFT 07/10/2016 Arne Cleveland, MD [facility]  . IR GENERIC HISTORICAL  07/10/2016   IR FLUORO GUIDE CV LINE LEFT 07/10/2016 Arne Cleveland, MD [facility]  . IR PATIENT EVAL TECH 0-60 MINS  10/07/2017  . IR PATIENT EVAL TECH 0-60 MINS  03/09/2018  . IR RADIOLOGIST EVAL & MGMT  07/21/2017  . ORIF PROXIMAL FEMORAL FRACTURE W/ ITST NAIL SYSTEM  03/2007   left, Dr. Shellia Carwin  . SMALL INTESTINE SURGERY  2005   multiple with right colon resection for ischemia/infarct   Social History   Occupational History  . Not on file  Tobacco Use  . Smoking status: Former Smoker    Types: Cigarettes    Quit date: 05/12/2004    Years since quitting: 15.6  . Smokeless tobacco: Never Used  Vaping Use  . Vaping Use: Never used  Substance and Sexual Activity  . Alcohol use: No  . Drug use: No  . Sexual activity: Yes    Comment: 1st intercourse 29 yo-5 partners

## 2020-01-12 DIAGNOSIS — M25619 Stiffness of unspecified shoulder, not elsewhere classified: Secondary | ICD-10-CM | POA: Diagnosis not present

## 2020-01-12 DIAGNOSIS — M25511 Pain in right shoulder: Secondary | ICD-10-CM | POA: Diagnosis not present

## 2020-01-13 ENCOUNTER — Other Ambulatory Visit: Payer: Self-pay

## 2020-01-13 ENCOUNTER — Ambulatory Visit (HOSPITAL_COMMUNITY)
Admission: RE | Admit: 2020-01-13 | Discharge: 2020-01-13 | Disposition: A | Discharge status: Home / Self Care | Payer: Medicare Other | Source: Ambulatory Visit | Attending: Cardiovascular Disease | Admitting: Cardiovascular Disease

## 2020-01-13 ENCOUNTER — Other Ambulatory Visit (HOSPITAL_COMMUNITY): Payer: Self-pay | Admitting: Cardiovascular Disease

## 2020-01-13 DIAGNOSIS — I779 Disorder of arteries and arterioles, unspecified: Secondary | ICD-10-CM | POA: Diagnosis not present

## 2020-01-13 DIAGNOSIS — I6523 Occlusion and stenosis of bilateral carotid arteries: Secondary | ICD-10-CM

## 2020-01-20 ENCOUNTER — Encounter: Payer: Self-pay | Admitting: Vascular Surgery

## 2020-01-20 ENCOUNTER — Ambulatory Visit (INDEPENDENT_AMBULATORY_CARE_PROVIDER_SITE_OTHER): Payer: Medicare Other | Admitting: Vascular Surgery

## 2020-01-20 ENCOUNTER — Other Ambulatory Visit: Payer: Self-pay

## 2020-01-20 VITALS — BP 133/78 | HR 76 | Temp 97.7°F | Resp 20 | Ht 68.0 in | Wt 144.0 lb

## 2020-01-20 DIAGNOSIS — K912 Postsurgical malabsorption, not elsewhere classified: Secondary | ICD-10-CM

## 2020-01-20 DIAGNOSIS — I871 Compression of vein: Secondary | ICD-10-CM

## 2020-01-20 DIAGNOSIS — M25619 Stiffness of unspecified shoulder, not elsewhere classified: Secondary | ICD-10-CM | POA: Diagnosis not present

## 2020-01-20 DIAGNOSIS — M25511 Pain in right shoulder: Secondary | ICD-10-CM | POA: Diagnosis not present

## 2020-01-20 NOTE — Progress Notes (Signed)
Patient ID: [name] [name], female   DOB: March 09, 1951, 69 y.o.   MRN: [id number]  Reason for Consult: Follow-up   Referred by [name], MD  Subjective:     HPI:  [name] [name] is a 69 y.o. 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  She did have some swelling of her right upper extremity has noted veins on her bilateral chest for several years.  Recently she had a fracture of her right upper extremity and has had increase in swelling.  She states this is actually improving over the last 2 weeks she now has full function of her hand mobility of her elbow has increased.  She has never had diagnosis of DVT requiring anticoagulation.  Past Medical History:  Diagnosis Date  . Abnormal LFTs 2013  . Allergic rhinosinusitis   . Anemia of chronic disease   . At risk for dental problems   . Atypical nevus   . Bacteremia due to Klebsiella pneumoniae 12/12/2011  . Bacterial overgrowth syndrome   . Brachial vein thrombus, left ([place]) 10/08/2012  . 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 Carotid stenosis    Carotid US (9/15):  R 40-59%; L 1-39% >> FU 1 year  . Closed left subtrochanteric femur fracture ([place]) 12/31/2017  . Congestive heart failure (CHF) (HCC)    EF 25-30%  . 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 Fracture of left clavicle   . History of blood transfusion 2013   anemia  . Hx of cardiovascular stress test    Myoview (9/15):  inf-apical scar; no ischemia; EF 47% - low risk   . 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 Osteoporosis   . Pancytopenia 10/07/2011  . Pathologic fracture of neck of femur ([place])   . Personal history of colonic polyps   . Renal insufficiency    hx of yrs ago  . Serratia marcescens infection - bactermia assoc w/ PICC 01/18/2015  . 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 Splenomegaly    By  ultrasound  . Thrombophilia ([place])   . Vitamin D deficiency    Family History  Problem Relation Age of Onset  . Diabetes Mother   . Hypertension Mother   . AAA (abdominal aortic aneurysm) Mother   . Colon cancer Neg Hx   . Stomach cancer Neg Hx    Past Surgical History:  Procedure Laterality Date  . APPENDECTOMY  yrs ago  . CHOLECYSTECTOMY  yrs ago  . COLONOSCOPY  12/05/2005   internal hemorrhoids (for polyp surveillance)  . COLONOSCOPY  04/26/2012   Procedure: COLONOSCOPY;  Surgeon: Gatha Mayer, MD;  Location: [facility];  Service: Endoscopy;  Laterality: N/A;  . COLONOSCOPY WITH PROPOFOL N/A 03/18/2016   Procedure: COLONOSCOPY WITH PROPOFOL;  Surgeon: Gatha Mayer, MD;  Location: [facility];  Service: Endoscopy;  Laterality: N/A;  . ESOPHAGOGASTRODUODENOSCOPY  01/22/2009   erosive esophagitis  . HARDWARE REMOVAL Left 12/31/2017   Procedure: HARDWARE REMOVAL;  Surgeon: Rod Can, MD;  Location: [facility];  Service: Orthopedics;  Laterality: Left;  . INTRAMEDULLARY (IM) NAIL INTERTROCHANTERIC Left 12/31/2017   Procedure: INTRAMEDULLARY (IM) NAIL SUBTROCHANTRIC;  Surgeon: Rod Can, MD;  Location: [facility];  Service: Orthopedics;  Laterality: Left;  . IR CV LINE INJECTION  03/23/2018  . IR FLUORO GUIDE CV LINE LEFT  01/01/2018  . IR FLUORO GUIDE CV LINE LEFT  03/24/2018  . IR FLUORO GUIDE CV LINE LEFT  05/21/2018  . IR FLUORO GUIDE CV LINE LEFT  07/09/2018  . IR GENERIC HISTORICAL  07/04/2016   IR REMOVAL TUN CV CATH W/O FL 07/04/2016 Ascencion Dike, PA-C [facility]  . IR GENERIC HISTORICAL  07/10/2016   IR US GUIDE VASC ACCESS LEFT 07/10/2016 Arne Cleveland, MD [facility]  . IR GENERIC HISTORICAL  07/10/2016   IR FLUORO GUIDE CV LINE LEFT 07/10/2016 Arne Cleveland, MD [facility]  . IR PATIENT EVAL TECH 0-60 MINS  10/07/2017  . IR PATIENT EVAL TECH 0-60 MINS  03/09/2018  . IR RADIOLOGIST EVAL & MGMT  07/21/2017  . ORIF PROXIMAL FEMORAL FRACTURE W/ ITST NAIL SYSTEM  03/2007    left, Dr. Shellia Carwin  . SMALL INTESTINE SURGERY  2005   multiple with right colon resection for ischemia/infarct    Short Social History:  Social History   Tobacco Use  . Smoking status: Former Smoker    Types: Cigarettes    Quit date: 05/12/2004    Years since quitting: 15.7  . Smokeless tobacco: Never Used  Substance Use Topics  . Alcohol use: No    Allergies  Allergen Reactions  . Penicillins Itching, Swelling and Other (See Comments)    Reaction:  Facial swelling Has patient had a PCN reaction causing immediate rash, facial/tongue/throat swelling, SOB or lightheadedness with hypotension: Yes Has patient had a PCN reaction causing severe rash involving mucus membranes or skin necrosis: No Has patient had a PCN reaction that required hospitalization No Has patient had a PCN reaction occurring within the last 10 years: No If all of the above answers are "NO", then may proceed with Cephalosporin use.    Current Outpatient Medications  Medication Sig Dispense Refill  . acetaminophen (TYLENOL) 500 MG tablet Take 1,000 mg by mouth daily as needed for mild pain.    . ADULT TPN 1,800 mLs 4 (four) times a week. Pt receives home TPN from [facility]:  1800 mL bag, four nights weekly (Monday, Tuesday, Wednesday, Thursday for 8 hours (includes 1 hour taper up and down).    Marland Kitchen albuterol (VENTOLIN HFA) 108 (90 Base) MCG/ACT inhaler TAKE 2 PUFFS BY MOUTH EVERY 6 HOURS AS NEEDED FOR WHEEZE OR SHORTNESS OF BREATH 18 g 2  . cetirizine (ZYRTEC) 10 MG tablet Take 1 tablet (10 mg total) by mouth daily. 30 tablet 11  . clobetasol ointment (TEMOVATE) 6.81 % Apply 1 application topically 2 (two) times daily. 30 g 0  . diphenhydrAMINE (BENADRYL) 25 mg capsule Take 25-50 mg by mouth every 6 (six) hours as needed for itching or sleep.     . diphenoxylate-atropine (LOMOTIL) 2.5-0.025 MG tablet Take 1 tablet by mouth 4 (four) times daily as needed for diarrhea or loose stools. 90 tablet 5  . fluticasone  (FLONASE) 50 MCG/ACT nasal spray Place 2 sprays into both nostrils daily. 16 g 6  . methocarbamol (ROBAXIN) 500 MG tablet Take 500 mg by mouth every 8 (eight) hours as needed for muscle spasms.    . Multiple Vitamin (MULTIVITAMIN WITH MINERALS) TABS Take 1 tablet by mouth daily.    Marland Kitchen omeprazole (PRILOSEC) 40 MG capsule TAKE 1 CAPSULE 2 (TWO) TIMES DAILY BY MOUTH. TAKE BEFORE BREAKFAST AND SUPPER 180 capsule 1  . ondansetron (ZOFRAN-ODT) 8 MG disintegrating tablet TAKE 1 TABLET (8 MG TOTAL) BY MOUTH EVERY 8 (EIGHT) HOURS AS NEEDED FOR NAUSEA OR VOMITING.  30 tablet 11  . PRESCRIPTION MEDICATION 5 mLs See admin instructions. 50 units Heparin sodium flush    . sodium chloride 0.9 % infusion Uses for TPN    . Teduglutide, rDNA, (GATTEX) 5 MG KIT Inject 3.3 Units into the skin daily. 1 kit 11  . vitamin E 400 UNIT capsule Take 400 Units by mouth daily.     [name] Reasons 20 MG TABS tablet TAKE 1 TABLET (20 MG TOTAL) BY MOUTH DAILY WITH SUPPER. 90 tablet 2   No current facility-administered medications for this visit.    Review of Systems  Constitutional:  Constitutional negative. HENT: HENT negative.  Eyes: Eyes negative.  Respiratory: Respiratory negative.  Cardiovascular: Cardiovascular negative.  GI: Gastrointestinal negative.  Musculoskeletal:       Right upper extremity swelling Skin:       Peau d'orange appearance Neurological: Neurological negative. Hematologic: Hematologic/lymphatic negative.  Psychiatric: Psychiatric negative.        Objective:  Objective   Vitals:   01/20/20 0817  BP: 133/78  Pulse: 76  Resp: 20  Temp: 97.7 F (36.5 C)  SpO2: 97%  Weight: 144 lb (65.3 kg)  Height: _0  (1.727 m)   Body mass index is 21.9 kg/m.  Physical Exam HENT:     Head: Normocephalic.     Nose:     Comments: Wearing a mask Eyes:     Pupils: Pupils are equal, round, and reactive to light.  Cardiovascular:     Pulses: Normal pulses.  Pulmonary:     Effort: Pulmonary effort  is normal.  Abdominal:     General: Abdomen is flat.     Palpations: Abdomen is soft.  Musculoskeletal:        General: Swelling present.     Comments: Right upper extremity swelling, minimal left upper extremity swelling  Skin:    Capillary Refill: Capillary refill takes less than 2 seconds.     Comments: Peau d'orange appearance right upper extremity  Neurological:     General: No focal deficit present.     Mental Status: She is alert.  Psychiatric:        Mood and Affect: Mood normal.        Behavior: Behavior normal.        Thought Content: Thought content normal.        Judgment: Judgment normal.     Data: Right upper extremity venous duplex IMPRESSION: 1. Occlusive thrombus in the right IJ vein, possibly chronic given the diminutive size of the vessel. 2. Small right subclavian vein with antegrade flow. 3. Negative for right upper extremity DVT.      Assessment/Plan:     69 year old female sent for evaluation right upper extremity swelling.  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  Currently she has a left-sided catheter.  I discussed with her the options being evaluation with venography versus nonoperative management which would include elevation and gentle compression as well as continued physical therapy and use of her right upper extremity.  Given that this appears to be a very chronic issue without a catheter on the right for several years I do think she would be best served without any intervention.  I discussed with her that if her symptoms worsen or do not improve over time I will see her again we could consider right upper extremity venography with possible intervention.  Miklos Bidinger Christopher Fia Hebert MD [facility]  

## 2020-01-24 LAB — FATTY ACID PANEL, ESSENTIAL (C12-C22), SERUM
Arachidic, C20:0: 17.66 umol/L (ref 16.8–38.5)
Arachidonic, C20:4w6: 659.96 umol/L (ref 351.4–1057)
DHA, C22:6w3: 74.81 umol/L (ref 53.3–216.3)
DPA, C22:5w3: 53.87 umol/L (ref 19.9–77.6)
DPA, C22:5w6: 13.3 umol/L (ref 9.2–32.1)
DTA, C22:4w6: 23.48 umol/L (ref 9.1–44.6)
Docosenoic, C22:1: 6.07 umol/L (ref 5.73–11.92)
EPA, C20:5w3: 29.41 umol/L (ref 12.4–123)
Hexadecenoic, C16:1w9: 83.13 umol/L — ABNORMAL HIGH (ref 19.82–59.9)
LAURIC,C12:0: 6.05 umol/L (ref 4.3–36)
Linoleic, C18:2w6: 3162.27 umol/L (ref 2653.4–613)
Mead, C20:3w9: 10.5 umol/L (ref 10.3–41.3)
Myristic, C14:0: 85.85 umol/L (ref 39.4–258.2)
Nervonic, C24:1w9: 95.24 umol/L (ref 56.9–132.7)
Oleic, C18:1w9: 2475.36 umol/L (ref 872.4–4182)
Palmitic, C16:0: 2582.92 umol/L (ref 1671.1–460)
Palmitoleic, C16:1w7: 405.36 umol/L (ref 68.5–570.2)
Stearic, C18:0: 812.59 umol/L (ref 590.2–1377)
Total Fatty Acids: 11.25 umol/L (ref 7.93–18.34)
Total Monounsaturated: 3.39 umol/L (ref 1.45–4.95)
Total Polyunsaturated: 4.3 umol/L (ref 3.57–8.11)
Total Saturated: 3.57 umol/L (ref 2.5–6.4)
Total w3: 0.25 umol/L (ref 0.1–0.5)
Total w6: 4.02 umol/L (ref 3.3–7.1)
Triene Tetraene Ratio: 0.02 (ref 0.02–0.05)
Vaccenic, C18:1w7: 299.43 umol/L — ABNORMAL HIGH (ref 84.8–260.8)
a Linolenic, C18:3w3: 95.28 umol/L (ref 26.1–150.1)
g Linolenic, C18:3w6: 47.69 umol/L (ref 14.5–144.9)
h g-Linolenic, C20:3w6: 112.87 umol/L (ref 70–319.9)

## 2020-01-24 LAB — PREALBUMIN: Prealbumin: 15 mg/dL — ABNORMAL LOW (ref 17–34)

## 2020-01-27 DIAGNOSIS — M25511 Pain in right shoulder: Secondary | ICD-10-CM | POA: Diagnosis not present

## 2020-01-27 DIAGNOSIS — M25619 Stiffness of unspecified shoulder, not elsewhere classified: Secondary | ICD-10-CM | POA: Diagnosis not present

## 2020-01-27 NOTE — Progress Notes (Signed)
Patient ID: [name] [name], female   DOB: [birth year], 69 y.o.   MRN: [id number]   Cardiology Office Note    Date:  02/10/2020   ID:  [name] [name], DOB 08/03/1950, MRN [id number]  PCP:  [name], MD  Cardiologist:  Dr. Jenkins Rouge      History of Present Illness: [name] [name] is a 69 y.o. 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 Carlean Purl and at [facility] with Dr. Clementeen Graham (has been placed on Gattex).  She has had cardiomyopathy with EF 45% echo 2015 RWMA inferior wall and apex she did not want to have heart cath Myovue done at that time showed inferior/ apical scar with no ischemia She was not on beta blocker due to bradycardia and first degree block at baseline. She stopped her imdur   Carotid duplex 07/19/15 40-59% RICA stenosis repeat 01/13/20 plaque no stenosis with new lab criteria  Echo 12/03/16 reviewed EF 40-45% AV sclerosis mean gradient 6 mmHg  She sees Dr Donzetta Matters [facility] for RUE swelling duplex with occlusive thrombus in right IJ chronic with small right subclavian antegrade flow. Deferred any venography intervention On xarelto Swelling compounded by fall 10/17/19 with right humeral fracture being followed by Dr Veverly Fells   Having access issues currently with port a cath left subclavian We discussed CAD Based on previous echo she likely has had silent IMI. She is asymptomatic and does not want cath. Discussed possible progression of AS and need for echo to reassess EF   Recent Labs: 01/09/2020: ALT 23; Creatinine, Ser 0.94; Hemoglobin 11.5; Potassium 3.3  Wt Readings from Last 3 Encounters:  02/10/20  144 lb 4.8 oz (65.5 kg)  01/20/20 144 lb (65.3 kg)  01/02/20 138 lb (62.6 kg)     Past Medical History:  Diagnosis Date  . Abnormal LFTs 2013  . Allergic rhinosinusitis   . Anemia of chronic disease   . At risk for dental problems   . Atypical nevus   . Bacteremia due to Klebsiella pneumoniae 12/12/2011  . Bacterial overgrowth syndrome   . Brachial vein thrombus, left ([place]) 10/08/2012  . 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 Carotid stenosis    Carotid US (9/15):  R 40-59%; L 1-39% >> FU 1 year  . Closed left subtrochanteric femur fracture ([place]) 12/31/2017  . Congestive heart failure (CHF) (HCC)    EF 25-30%  . 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 Fracture of left clavicle   . History of blood transfusion 2013   anemia  . Hx of cardiovascular stress test    Myoview (9/15):  inf-apical scar; no ischemia; EF 47% - low risk   . 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 Osteoporosis   . Pancytopenia 10/07/2011  . Pathologic fracture of neck of femur ([place])   . Personal history of colonic polyps   . Renal insufficiency    hx of yrs ago  . Serratia marcescens infection - bactermia assoc w/ PICC 01/18/2015  . 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 Splenomegaly  By ultrasound  . Thrombophilia ([place])   . Vitamin D deficiency     Current Outpatient Medications  Medication Sig Dispense Refill  . acetaminophen (TYLENOL) 500 MG tablet Take 1,000 mg by mouth daily as needed for mild pain.    . ADULT TPN 1,800 mLs 4 (four) times a week. Pt receives home TPN from [facility]:  1800 mL bag, four nights weekly (Monday, Tuesday, Wednesday, Thursday for 8 hours (includes 1 hour taper up and down).    Marland Kitchen albuterol (VENTOLIN HFA) 108 (90 Base) MCG/ACT inhaler TAKE 2 PUFFS BY MOUTH EVERY 6 HOURS AS NEEDED FOR WHEEZE OR SHORTNESS OF BREATH 18 g 2  . cetirizine (ZYRTEC) 10 MG tablet Take 1 tablet (10 mg total) by mouth daily. 30  tablet 11  . clobetasol ointment (TEMOVATE) 6.30 % Apply 1 application topically 2 (two) times daily. 30 g 0  . diphenhydrAMINE (BENADRYL) 25 mg capsule Take 25-50 mg by mouth every 6 (six) hours as needed for itching or sleep.     . fluticasone (FLONASE) 50 MCG/ACT nasal spray Place 2 sprays into both nostrils daily. 16 g 6  . Multiple Vitamin (MULTIVITAMIN WITH MINERALS) TABS Take 1 tablet by mouth daily.    Marland Kitchen omeprazole (PRILOSEC) 40 MG capsule TAKE 1 CAPSULE 2 (TWO) TIMES DAILY BY MOUTH. TAKE BEFORE BREAKFAST AND SUPPER 180 capsule 1  . ondansetron (ZOFRAN-ODT) 8 MG disintegrating tablet TAKE 1 TABLET (8 MG TOTAL) BY MOUTH EVERY 8 (EIGHT) HOURS AS NEEDED FOR NAUSEA OR VOMITING. 30 tablet 11  . PRESCRIPTION MEDICATION 5 mLs See admin instructions. 50 units Heparin sodium flush    . sodium chloride 0.9 % infusion Uses for TPN    . Teduglutide, rDNA, (GATTEX) 5 MG KIT Inject 3.3 Units into the skin daily. 1 kit 11  . vitamin E 400 UNIT capsule Take 400 Units by mouth daily.     [name] Reasons 20 MG TABS tablet TAKE 1 TABLET (20 MG TOTAL) BY MOUTH DAILY WITH SUPPER. 90 tablet 2   No current facility-administered medications for this visit.    Allergies:   Penicillins   Social History:  The patient  reports that she quit smoking about 15 years ago. Her smoking use included cigarettes. She has never used smokeless tobacco. She reports that she does not drink alcohol and does not use drugs.   Family History:  The patient's family history includes AAA (abdominal aortic aneurysm) in her mother; Diabetes in her mother; Hypertension in her mother.   ROS:  Please see the history of present illness.   She has chronic diarrhea.   All other systems reviewed and negative.   PHYSICAL EXAM: VS:  BP 110/70   Pulse 63   Ht $R'5\' 8"'LT$  (1.727 m)   Wt 144 lb 4.8 oz (65.5 kg)   LMP 02/26/2014   SpO2 97%   BMI 21.94 kg/m  Affect appropriate Healthy:  appears stated age 40- left IJ catheter  Neck supple with  no adenopathy JVP normal right  bruits no thyromegaly Lungs clear with no wheezing and good diaphragmatic motion Heart:  S1/S2 AS  murmur, no rub, gallop or click PMI normal left port a cath  Abdomen: benighn, BS positve, no tenderness, no AAA no bruit.  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 Silent inferior apical MI.     Short Bowel Syndrome stable getting TPN every 4 days via PICC line She had line in right IJ for years and has chronic thrombosis with some RUE swelling on Xarelto f/u [facility] Dr Donzetta Matters   Carotids:  Plaque no stenosis 01/13/20   Othro:  F/U Dr Veverly Fells healing right proximal humerus fracture   Murmur:  AV sclerosis / stenosis mean gradient 8 mmHg echo 12/03/16 see above regarding repeat echo   Discussed possible need for right and left cath if echo shows decreased EF or progressive AS  Jenkins Rouge

## 2020-02-02 DIAGNOSIS — M25511 Pain in right shoulder: Secondary | ICD-10-CM | POA: Diagnosis not present

## 2020-02-03 ENCOUNTER — Ambulatory Visit: Payer: Medicare Other

## 2020-02-03 ENCOUNTER — Other Ambulatory Visit: Payer: Self-pay

## 2020-02-03 ENCOUNTER — Ambulatory Visit (INDEPENDENT_AMBULATORY_CARE_PROVIDER_SITE_OTHER): Payer: Medicare Other

## 2020-02-03 DIAGNOSIS — Z23 Encounter for immunization: Secondary | ICD-10-CM | POA: Diagnosis not present

## 2020-02-10 ENCOUNTER — Encounter: Payer: Self-pay | Admitting: Cardiovascular Disease

## 2020-02-10 ENCOUNTER — Ambulatory Visit (INDEPENDENT_AMBULATORY_CARE_PROVIDER_SITE_OTHER): Payer: Medicare Other | Admitting: Cardiovascular Disease

## 2020-02-10 ENCOUNTER — Other Ambulatory Visit: Payer: Self-pay

## 2020-02-10 VITALS — BP 110/70 | HR 63 | Ht 68.0 in | Wt 144.3 lb

## 2020-02-10 DIAGNOSIS — I509 Heart failure, unspecified: Secondary | ICD-10-CM

## 2020-02-10 DIAGNOSIS — I350 Nonrheumatic aortic (valve) stenosis: Secondary | ICD-10-CM | POA: Diagnosis not present

## 2020-02-10 DIAGNOSIS — I2510 Atherosclerotic heart disease of native coronary artery without angina pectoris: Secondary | ICD-10-CM | POA: Diagnosis not present

## 2020-02-10 DIAGNOSIS — R011 Cardiac murmur, unspecified: Secondary | ICD-10-CM

## 2020-02-10 DIAGNOSIS — I429 Cardiomyopathy, unspecified: Secondary | ICD-10-CM

## 2020-02-10 NOTE — Patient Instructions (Addendum)
Medication Instructions:  *If you need a refill on your cardiac medications before your next appointment, please call your pharmacy*  Lab Work: If you have labs (blood work) drawn today and your tests are completely normal, you will receive your results only by: . MyChart Message (if you have MyChart) OR . A paper copy in the mail If you have any lab test that is abnormal or we need to change your treatment, we will call you to review the results.  Testing/Procedures: Your physician has requested that you have an echocardiogram. Echocardiography is a painless test that uses sound waves to create images of your heart. It provides your doctor with information about the size and shape of your heart and how well your heart's chambers and valves are working. This procedure takes approximately one hour. There are no restrictions for this procedure.  Follow-Up: At [facility], you and your health needs are our priority.  As part of our continuing mission to provide you with exceptional heart care, we have created designated Provider Care Teams.  These Care Teams include your primary Cardiologist (physician) and Advanced Practice Providers (APPs -  Physician Assistants and Nurse Practitioners) who all work together to provide you with the care you need, when you need it.  We recommend signing up for the patient portal called "MyChart".  Sign up information is provided on this After Visit Summary.  MyChart is used to connect with patients for Virtual Visits (Telemedicine).  Patients are able to view lab/test results, encounter notes, upcoming appointments, etc.  Non-urgent messages can be sent to your provider as well.   To learn more about what you can do with MyChart, go to [email or web address].com.    Your next appointment:   12 month(s)  The format for your next appointment:   In Person  Provider:   You may see Dr. Nishan or one of the following Advanced Practice Providers on your designated  Care Team:    Lori Gerhardt, NP  Laura Ingold, NP  Jill McDaniel, NP   

## 2020-02-14 ENCOUNTER — Other Ambulatory Visit: Payer: Self-pay | Admitting: Internal Medicine

## 2020-02-17 ENCOUNTER — Other Ambulatory Visit: Payer: Self-pay | Admitting: Family Medicine

## 2020-03-05 ENCOUNTER — Other Ambulatory Visit (INDEPENDENT_AMBULATORY_CARE_PROVIDER_SITE_OTHER): Payer: Medicare Other

## 2020-03-05 DIAGNOSIS — K912 Postsurgical malabsorption, not elsewhere classified: Secondary | ICD-10-CM | POA: Diagnosis not present

## 2020-03-05 LAB — COMPREHENSIVE METABOLIC PANEL
ALT: 23 U/L (ref 0–35)
AST: 19 U/L (ref 0–37)
Albumin: 3.3 g/dL — ABNORMAL LOW (ref 3.5–5.2)
Alkaline Phosphatase: 125 U/L — ABNORMAL HIGH (ref 39–117)
BUN: 11 mg/dL (ref 6–23)
CO2: 23 mEq/L (ref 19–32)
Calcium: 8.5 mg/dL (ref 8.4–10.5)
Chloride: 108 mEq/L (ref 96–112)
Creatinine, Ser: 0.96 mg/dL (ref 0.40–1.20)
GFR: 60.23 mL/min (ref >60.00)
Glucose, Bld: 72 mg/dL (ref 70–99)
Potassium: 3.5 mEq/L (ref 3.5–5.1)
Sodium: 138 mEq/L (ref 135–145)
Total Bilirubin: 0.8 mg/dL (ref 0.2–1.2)
Total Protein: 7.2 g/dL (ref 6.0–8.3)

## 2020-03-05 LAB — CBC
HCT: 34.1 % — ABNORMAL LOW (ref 36.0–46.0)
Hemoglobin: 11.1 g/dL — ABNORMAL LOW (ref 12.0–15.0)
MCHC: 32.4 g/dL (ref 30.0–36.0)
MCV: 97.1 fl (ref 78.0–100.0)
Platelets: 136 10*3/uL — ABNORMAL LOW (ref 150.0–400.0)
RBC: 3.52 Mil/uL — ABNORMAL LOW (ref 3.87–5.11)
RDW: 15.6 % — ABNORMAL HIGH (ref 11.5–15.5)
WBC: 5 10*3/uL (ref 4.0–10.5)

## 2020-03-05 LAB — PHOSPHORUS: Phosphorus: 3.7 mg/dL (ref 2.3–4.6)

## 2020-03-05 LAB — VITAMIN B12: Vitamin B-12: 390 pg/mL (ref 211–911)

## 2020-03-05 LAB — MAGNESIUM: Magnesium: 1.8 mg/dL (ref 1.5–2.5)

## 2020-03-05 LAB — C-REACTIVE PROTEIN: CRP: <1 mg/dL (ref 0.5–20.0)

## 2020-03-06 ENCOUNTER — Other Ambulatory Visit: Payer: Self-pay

## 2020-03-06 ENCOUNTER — Telehealth: Payer: Self-pay | Admitting: Internal Medicine

## 2020-03-06 MED ORDER — CIPROFLOXACIN HCL 250 MG PO TABS: 250.0000 mg | ORAL_TABLET | Freq: Two times a day (BID) | ORAL | 0 refills | Status: DC

## 2020-03-06 NOTE — Telephone Encounter (Signed)
Cipro 250 mg bid x 10 days. 

## 2020-03-06 NOTE — Telephone Encounter (Signed)
Sent order to patient's pharmacy for Cipro-250mg  BID x 10 days. Also called patient and gave instructions for taking

## 2020-03-06 NOTE — Telephone Encounter (Signed)
Patient reports that she has had 4 days of bloating and gas.  She has tried OTC TUMS with no improvement.  She feels she possibly had a recurrence of bacterial overgrowth.  She is on TPN 4 days a week, continued Gattex.  Doing well on that side.  She had labs earlier this week. Please review and advise.

## 2020-03-08 ENCOUNTER — Telehealth: Payer: Self-pay

## 2020-03-08 LAB — PREALBUMIN: Prealbumin: 15 mg/dL — ABNORMAL LOW (ref 17–34)

## 2020-03-08 NOTE — Telephone Encounter (Signed)
FAXED [place]: [facility] , fax# [phone] Document: Labs from 03/05/2020    All above requested information has been faxed successfully to the Company listed above.  Fax confirmation received.

## 2020-03-14 ENCOUNTER — Telehealth: Payer: Self-pay

## 2020-03-14 ENCOUNTER — Ambulatory Visit (HOSPITAL_COMMUNITY): Payer: Medicare Other | Attending: Cardiovascular Disease

## 2020-03-14 ENCOUNTER — Telehealth: Payer: Self-pay | Admitting: Family Medicine

## 2020-03-14 ENCOUNTER — Other Ambulatory Visit: Payer: Self-pay

## 2020-03-14 DIAGNOSIS — I429 Cardiomyopathy, unspecified: Secondary | ICD-10-CM | POA: Diagnosis not present

## 2020-03-14 DIAGNOSIS — R9430 Abnormal result of cardiovascular function study, unspecified: Secondary | ICD-10-CM

## 2020-03-14 DIAGNOSIS — I350 Nonrheumatic aortic (valve) stenosis: Secondary | ICD-10-CM | POA: Diagnosis not present

## 2020-03-14 DIAGNOSIS — I509 Heart failure, unspecified: Secondary | ICD-10-CM

## 2020-03-14 LAB — ECHOCARDIOGRAM COMPLETE
AR max vel: 1.53 cm2
AV Area VTI: 1.68 cm2
AV Area mean vel: 1.45 cm2
AV Mean grad: 9.5 mmHg
AV Peak grad: 18.6 mmHg
Ao pk vel: 2.16 m/s
Area-P 1/2: 2.05 cm2
S' Lateral: 3.6 cm

## 2020-03-14 MED ORDER — ENTRESTO 24-26 MG PO TABS: 1.0000 | ORAL_TABLET | Freq: Two times a day (BID) | ORAL | 11 refills | Status: DC

## 2020-03-14 MED ORDER — PERFLUTREN LIPID MICROSPHERE
1.0000 mL | INTRAVENOUS | Status: AC | PRN
  Administered 2020-03-14: 1 mL via INTRAVENOUS

## 2020-03-14 NOTE — Telephone Encounter (Signed)
Left message for patient to schedule Annual Wellness Visit.  Please schedule with Nurse Health Advisor Martha Stanley, RN at [place]  

## 2020-03-14 NOTE — Telephone Encounter (Signed)
The patient has been notified of the result and verbalized understanding.  All questions (if any) were answered. Michaelyn Barter, RN 03/14/2020 4:50 PM   Will put in referral for CHF clinic. Patient will start Entresto 24/26 mg by mouth twice daily.

## 2020-03-14 NOTE — Telephone Encounter (Signed)
-----   Message from Josue Hector, MD sent at 03/14/2020  2:27 PM EDT ----- EF bad again have her start entresto lowest dose and f/u with CHF clinic she needs right and left cath after being on medical Rx for a while

## 2020-03-22 LAB — FATTY ACID PANEL, ESSENTIAL (C12-C22), SERUM
Arachidic, C20:0: 10.24 umol/L — ABNORMAL LOW (ref 16.8–38.5)
Arachidonic, C20:4w6: 597.25 umol/L (ref 351.4–1057)
DHA, C22:6w3: 78.73 umol/L (ref 53.3–216.3)
DPA, C22:5w3: 60.44 umol/L (ref 19.9–77.6)
DPA, C22:5w6: 17.06 umol/L (ref 9.2–32.1)
DTA, C22:4w6: 28.5 umol/L (ref 9.1–44.6)
Docosenoic, C22:1: 6.78 umol/L (ref 5.73–11.92)
EPA, C20:5w3: 35.39 umol/L (ref 12.4–123)
Hexadecenoic, C16:1w9: 105.63 umol/L — ABNORMAL HIGH (ref 19.82–59.9)
LAURIC,C12:0: 12.56 umol/L (ref 4.3–36)
Linoleic, C18:2w6: 3066.46 umol/L (ref 2653.4–613)
Mead, C20:3w9: 18.55 umol/L (ref 10.3–41.3)
Myristic, C14:0: 127.28 umol/L (ref 39.4–258.2)
Nervonic, C24:1w9: 93.61 umol/L (ref 56.9–132.7)
Oleic, C18:1w9: 3198.37 umol/L (ref 872.4–4182)
Palmitic, C16:0: 2967.76 umol/L (ref 1671.1–460)
Palmitoleic, C16:1w7: 524.91 umol/L (ref 68.5–570.2)
Stearic, C18:0: 832.64 umol/L (ref 590.2–1377)
Total Fatty Acids: 12.56 umol/L (ref 7.93–18.34)
Total Monounsaturated: 4.32 umol/L (ref 1.45–4.95)
Total Polyunsaturated: 4.22 umol/L (ref 3.57–8.11)
Total Saturated: 4.02 umol/L (ref 2.5–6.4)
Total w3: 0.29 umol/L (ref 0.1–0.5)
Total w6: 3.89 umol/L (ref 3.3–7.1)
Triene Tetraene Ratio: 0.03 (ref 0.02–0.05)
Vaccenic, C18:1w7: 361.66 umol/L — ABNORMAL HIGH (ref 84.8–260.8)
a Linolenic, C18:3w3: 114.45 umol/L (ref 26.1–150.1)
g Linolenic, C18:3w6: 53.71 umol/L (ref 14.5–144.9)
h g-Linolenic, C20:3w6: 124.5 umol/L (ref 70–319.9)

## 2020-03-23 ENCOUNTER — Telehealth: Payer: Self-pay | Admitting: Family Medicine

## 2020-03-23 NOTE — Chronic Care Management (AMB) (Signed)
  Chronic Care Management   Note  03/23/2020 Name: [name] [name] MRN: [id number] DOB: 1950/12/20  [name] [name] is a 69 y.o. year old female who is a primary care patient of [name], MD. I reached out to [name] [name] by phone today in response to a referral sent by [name] [name]'s PCP, [name], MD.   [name] [name] was given information about Chronic Care Management services today including:  1. CCM service includes personalized support from designated clinical staff supervised by her physician, including individualized plan of care and coordination with other care providers 2. 24/7 contact phone numbers for assistance for urgent and routine care needs. 3. Service will only be billed when office clinical staff spend 20 minutes or more in a month to coordinate care. 4. Only one practitioner may furnish and bill the service in a calendar month. 5. The patient may stop CCM services at any time (effective at the end of the month) by phone call to the office staff.   Patient agreed to services and verbal consent obtained.   Follow up plan:   Lauretta Grill Upstream Scheduler

## 2020-04-09 ENCOUNTER — Other Ambulatory Visit (INDEPENDENT_AMBULATORY_CARE_PROVIDER_SITE_OTHER): Payer: Medicare Other

## 2020-04-09 DIAGNOSIS — K912 Postsurgical malabsorption, not elsewhere classified: Secondary | ICD-10-CM | POA: Diagnosis not present

## 2020-04-09 LAB — COMPREHENSIVE METABOLIC PANEL
ALT: 20 U/L (ref 0–35)
AST: 19 U/L (ref 0–37)
Albumin: 3.1 g/dL — ABNORMAL LOW (ref 3.5–5.2)
Alkaline Phosphatase: 104 U/L (ref 39–117)
BUN: 9 mg/dL (ref 6–23)
CO2: 22 mEq/L (ref 19–32)
Calcium: 8.3 mg/dL — ABNORMAL LOW (ref 8.4–10.5)
Chloride: 109 mEq/L (ref 96–112)
Creatinine, Ser: 1.03 mg/dL (ref 0.40–1.20)
GFR: 55.31 mL/min — ABNORMAL LOW (ref >60.00)
Glucose, Bld: 75 mg/dL (ref 70–99)
Potassium: 3.4 mEq/L — ABNORMAL LOW (ref 3.5–5.1)
Sodium: 138 mEq/L (ref 135–145)
Total Bilirubin: 0.5 mg/dL (ref 0.2–1.2)
Total Protein: 7.1 g/dL (ref 6.0–8.3)

## 2020-04-09 LAB — CBC
HCT: 32.3 % — ABNORMAL LOW (ref 36.0–46.0)
Hemoglobin: 10.6 g/dL — ABNORMAL LOW (ref 12.0–15.0)
MCHC: 32.8 g/dL (ref 30.0–36.0)
MCV: 96.7 fl (ref 78.0–100.0)
Platelets: 124 10*3/uL — ABNORMAL LOW (ref 150.0–400.0)
RBC: 3.34 Mil/uL — ABNORMAL LOW (ref 3.87–5.11)
RDW: 14.9 % (ref 11.5–15.5)
WBC: 4.3 10*3/uL (ref 4.0–10.5)

## 2020-04-09 LAB — PHOSPHORUS: Phosphorus: 4 mg/dL (ref 2.3–4.6)

## 2020-04-09 LAB — VITAMIN B12: Vitamin B-12: 359 pg/mL (ref 211–911)

## 2020-04-09 LAB — MAGNESIUM: Magnesium: 1.8 mg/dL (ref 1.5–2.5)

## 2020-04-09 LAB — C-REACTIVE PROTEIN: CRP: <1 mg/dL (ref 0.5–20.0)

## 2020-04-10 ENCOUNTER — Telehealth: Payer: Self-pay

## 2020-04-10 NOTE — Progress Notes (Unsigned)
Chronic Care Management Pharmacy Assistant   Name: [name] [name]  MRN: [id number] DOB: 1950-12-15  Reason for Encounter: Medication Review/ Initial Visit  PCP : [name], MD  Allergies:   Allergies  Allergen Reactions  . Penicillins Itching, Swelling and Other (See Comments)    Reaction:  Facial swelling Has patient had a PCN reaction causing immediate rash, facial/tongue/throat swelling, SOB or lightheadedness with hypotension: Yes Has patient had a PCN reaction causing severe rash involving mucus membranes or skin necrosis: No Has patient had a PCN reaction that required hospitalization No Has patient had a PCN reaction occurring within the last 10 years: No If all of the above answers are "NO", then may proceed with Cephalosporin use.    Medications: Outpatient Encounter Medications as of 04/10/2020  Medication Sig  . acetaminophen (TYLENOL) 500 MG tablet Take 1,000 mg by mouth daily as needed for mild pain.  . ADULT TPN 1,800 mLs 4 (four) times a week. Pt receives home TPN from [facility]:  1800 mL bag, four nights weekly (Monday, Tuesday, Wednesday, Thursday for 8 hours (includes 1 hour taper up and down).  Marland Kitchen albuterol (VENTOLIN HFA) 108 (90 Base) MCG/ACT inhaler TAKE 2 PUFFS BY MOUTH EVERY 6 HOURS AS NEEDED FOR WHEEZE OR SHORTNESS OF BREATH  . cetirizine (ZYRTEC) 10 MG tablet Take 1 tablet (10 mg total) by mouth daily.  . ciprofloxacin (CIPRO) 250 MG tablet Take 1 tablet (250 mg total) by mouth 2 (two) times daily.  . clobetasol ointment (TEMOVATE) 2.50 % Apply 1 application topically 2 (two) times daily.  . diphenhydrAMINE (BENADRYL) 25 mg capsule Take 25-50 mg by mouth every 6 (six) hours as needed for itching or sleep.   . fluticasone (FLONASE) 50 MCG/ACT nasal spray Place 2 sprays into both nostrils daily.  . Multiple Vitamin (MULTIVITAMIN WITH MINERALS) TABS Take 1 tablet by mouth daily.  Marland Kitchen omeprazole (PRILOSEC) 40 MG capsule TAKE 1 CAPSULE 2 (TWO) TIMES  DAILY BY MOUTH. TAKE BEFORE BREAKFAST AND SUPPER  . ondansetron (ZOFRAN-ODT) 8 MG disintegrating tablet TAKE 1 TABLET (8 MG TOTAL) BY MOUTH EVERY 8 (EIGHT) HOURS AS NEEDED FOR NAUSEA OR VOMITING.  Marland Kitchen PRESCRIPTION MEDICATION 5 mLs See admin instructions. 50 units Heparin sodium flush  . sacubitril-valsartan (ENTRESTO) 24-26 MG Take 1 tablet by mouth 2 (two) times daily.  . sodium chloride 0.9 % infusion Uses for TPN  . Teduglutide, rDNA, (GATTEX) 5 MG KIT Inject 3.3 Units into the skin daily.  . vitamin E 400 UNIT capsule Take 400 Units by mouth daily.   [name] Reasons 20 MG TABS tablet TAKE 1 TABLET (20 MG TOTAL) BY MOUTH DAILY WITH SUPPER.   No facility-administered encounter medications on file as of 04/10/2020.    Current Diagnosis: Patient Active Problem List   Diagnosis Date Noted  . 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 Hypotension 12/30/2017  . Bradycardia 12/30/2017  . 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 Degenerative joint disease 07/08/2016  . PICC line infection 05/16/2015  . 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 Splenomegaly   . Hx of adenomatous colonic polyps 04/18/2012  . Chronic systolic heart failure ([place]) 12/25/2011  . Osteoporosis 03/14/2010  . Anemia of chronic disease 10/01/2009  . GERD (gastroesophageal reflux disease) 11/19/2008  . ALKALINE PHOSPHATASE, ELEVATED 10/12/2008  . VITAMIN D DEFICIENCY 01/19/2008  . TRANSAMINASES, SERUM, ELEVATED 01/19/2008  . Bacterial overgrowth syndrome 11/19/2004  . Acquired short bowel syndrome  11/20/2003    Goals Addressed   None     Follow-Up:  {[facility] Follow-up:24147}

## 2020-04-13 ENCOUNTER — Other Ambulatory Visit: Payer: Self-pay

## 2020-04-13 ENCOUNTER — Ambulatory Visit: Payer: Medicare Other

## 2020-04-13 DIAGNOSIS — M8000XD Age-related osteoporosis with current pathological fracture, unspecified site, subsequent encounter for fracture with routine healing: Secondary | ICD-10-CM

## 2020-04-13 DIAGNOSIS — I5022 Chronic systolic (congestive) heart failure: Secondary | ICD-10-CM

## 2020-04-13 NOTE — Patient Instructions (Addendum)
Please review care plan below and call me at [phone] with any questions!  Thank you, Edyth Gunnels., Clinical Pharmacist  Goals Addressed            This Visit's Progress   . Patient [name] [name] (see longitudinal plan of care for additional care plan information)  Current Barriers:  . Chronic Disease Management support, education, and care coordination needs related to heart failure, Osteoporosis and DVT history/chronic anticoagulation   Heart Failure BP Readings from Last 3 Encounters:  02/10/20 110/70  01/20/20 133/78  01/02/20 112/70   . Pharmacist Clinical Goal(s): o Over the next 365 days, patient will work with PharmD and providers to ensure medication safety and accessibility and maintain BP goal <130/80 . Current regimen:  o Entresto 24-26 mg once daily  . Interventions: o We discussed:  Patient Assistance Program for [place] - agreed to move forward with application. . Patient self care activities - Over the next 365 days, patient will: o Continue current management o Notify provider of weight gain of 3 lb in one day Osteoporosis . Pharmacist Clinical Goal(s) o Over the next 90 days, patient will work with PharmD and providers to ensure medication safety and maintenance therapy for osteoporosis  . Current regimen:  o Calcium/vitamin d3 supplementation . Interventions: o Reviewed Prolia side effects and counseled on general use - agree to move forward with this  o We discussed: Recommend weight-bearing and muscle strengthening exercises for building and maintaining bone density.  . Patient self care activities - Over the next 90 days, patient will: o Follow-up for any labs if requested by PCP o Fill out any required documentation for patient assistance applications as appropriate  DVT history/chronic anticoagulation . Pharmacist Clinical Goal(s) o Over the next 365 days, patient will work with PharmD and providers to ensure medication safety and  accessibility . Current regimen:  o Xarelto 20 mg once daily with supper . Interventions: o Reviewed side effects including signs of bleed o Reviewed patient assistance program for Xarelto - will pursue together. . Patient self care activities - Over the next 365 days, patient will: o Continue current management  Medication management . Pharmacist Clinical Goal(s): o Over the next 365 days, patient will work with PharmD and providers to maintain optimal medication adherence . Current pharmacy: [facility] [place] . Interventions o Comprehensive medication review performed. o Continue current medication management strategy . Patient self care activities - Over the next 365 days, patient will: o Take medications as prescribed o Report any questions or concerns to PharmD and/or provider(s) Initial goal documentation.      The patient verbalized understanding of instructions provided today and agreed to receive a mailed copy of patient instruction and/or educational materials. Telephone follow up appointment with pharmacy team member scheduled for: See next appointment with "Care Management Staff" under "What's Next" below.   Madelin Rear, Pharm.D., BCGP Clinical Pharmacist [facility] [phone]  Osteoporosis  Osteoporosis is thinning and loss of density in your bones. Osteoporosis makes bones more brittle and fragile and more likely to break (fracture). Over time, osteoporosis can cause your bones to become so weak that they fracture after a minor fall. Bones in the hip, wrist, and spine are most likely to fracture due to osteoporosis. What are the causes? The exact cause of this condition is not known. What increases the risk? You may be at greater risk for osteoporosis if you:  Have a family  history of the condition.  Have poor nutrition.  Use steroid medicines, such as prednisone.  Are female.  Are age 47 or older.  Smoke or have a history of smoking.  Are  not physically active (are sedentary).  Are white (Caucasian) or of Asian descent.  Have a small body frame.  Take certain medicines, such as antiseizure medicines. What are the signs or symptoms? A fracture might be the first sign of osteoporosis, especially if the fracture results from a fall or injury that usually would not cause a bone to break. Other signs and symptoms include:  Pain in the neck or low back.  Stooped posture.  Loss of height. How is this diagnosed? This condition may be diagnosed based on:  Your medical history.  A physical exam.  A bone mineral density test, also called a DXA or DEXA test (dual-energy X-ray absorptiometry test). This test uses X-rays to measure the amount of minerals in your bones. How is this treated? The goal of treatment is to strengthen your bones and lower your risk for a fracture. Treatment may involve:  Making lifestyle changes, such as: ? Including foods with more calcium and vitamin D in your diet. ? Doing weight-bearing and muscle-strengthening exercises. ? Stopping tobacco use. ? Limiting alcohol intake.  Taking medicine to slow the process of bone loss or to increase bone density.  Taking daily supplements of calcium and vitamin D.  Taking hormone replacement medicines, such as estrogen for women and testosterone for men.  Monitoring your levels of calcium and vitamin D. Follow these instructions at home:  Activity  Exercise as told by your health care provider. Ask your health care provider what exercises and activities are safe for you. You should do: ? Exercises that make you work against gravity (weight-bearing exercises), such as tai chi, yoga, or walking. ? Exercises to strengthen muscles, such as lifting weights. Lifestyle  Limit alcohol intake to no more than 1 drink a day for nonpregnant women and 2 drinks a day for men. One drink equals 12 oz of beer, 5 oz of wine, or 1 oz of hard liquor.  Do not use any  products that contain nicotine or tobacco, such as cigarettes and e-cigarettes. If you need help quitting, ask your health care provider. Preventing falls  Use devices to help you move around (mobility aids) as needed, such as canes, walkers, scooters, or crutches.  Keep rooms well-lit and clutter-free.  Remove tripping hazards from walkways, including cords and throw rugs.  Install grab bars in bathrooms and safety rails on stairs.  Use rubber mats in the bathroom and other areas that are often wet or slippery.  Wear closed-toe shoes that fit well and support your feet. Wear shoes that have rubber soles or low heels.  Review your medicines with your health care provider. Some medicines can cause dizziness or changes in blood pressure, which can increase your risk of falling. General instructions  Include calcium and vitamin D in your diet. Calcium is important for bone health, and vitamin D helps your body to absorb calcium. Good sources of calcium and vitamin D include: ? Certain fatty fish, such as salmon and tuna. ? Products that have calcium and vitamin D added to them (fortified products), such as fortified cereals. ? Egg yolks. ? Cheese. ? Liver.  Take over-the-counter and prescription medicines only as told by your health care provider.  Keep all follow-up visits as told by your health care provider. This is important. Contact  a health care provider if:  You have never been screened for osteoporosis and you are: ? A woman who is age 25 or older. ? A man who is age 39 or older. Get help right away if:  You fall or injure yourself. Summary  Osteoporosis is thinning and loss of density in your bones. This makes bones more brittle and fragile and more likely to break (fracture),even with minor falls.  The goal of treatment is to strengthen your bones and reduce your risk for a fracture.  Include calcium and vitamin D in your diet. Calcium is important for bone health,  and vitamin D helps your body to absorb calcium.  Talk with your health care provider about screening for osteoporosis if you are a woman who is age 72 or older, or a man who is age 25 or older. This information is not intended to replace advice given to you by your health care provider. Make sure you discuss any questions you have with your health care provider. Document Revised: 04/10/2017 Document Reviewed: 02/20/2017 Elsevier Patient Education  2020 [employer].

## 2020-04-13 NOTE — Progress Notes (Signed)
Chronic Care Management Pharmacy  Name: [name] [name] MRN: [id number]   DOB: [birth year]  Chief Complaint/ HPI [name] [name], 69 y.o., female, presents for their initial CCM visit with the clinical pharmacist via telephone due to COVID-19 pandemic .  PCP : [name], MD Encounter Diagnoses  Name Primary?  . Chronic systolic heart failure ([place]) Yes  . Osteoporosis with current pathological fracture with routine healing, unspecified osteoporosis type, subsequent encounter     Office Visits:  12/02/2019 (PCP): dexa - op. prolia suggested.  Consult Visit: 03/14/2020 (Dr Johnsie Cancel): start entresto lowest dose and f/u with CHF clinic she needs right and left cath after being on medical Rx for a while. Place referral to heart failure to clinic.  02/10/2020 (Dr Johnsie Cancel): had not been seen for ~3 years.like has CAD  Patient Active Problem List   Diagnosis Date Noted  . 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 Hypotension 12/30/2017  . Bradycardia 12/30/2017  . 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 Degenerative joint disease 07/08/2016  . PICC line infection 05/16/2015  . 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 Splenomegaly   . Hx of adenomatous colonic polyps 04/18/2012  . Chronic systolic heart failure ([place]) 12/25/2011  . Osteoporosis 03/14/2010  . Anemia of chronic disease 10/01/2009  . GERD (gastroesophageal reflux disease) 11/19/2008  . ALKALINE PHOSPHATASE, ELEVATED 10/12/2008  . VITAMIN D DEFICIENCY 01/19/2008  . TRANSAMINASES, SERUM, ELEVATED 01/19/2008  . Bacterial overgrowth syndrome 11/19/2004  . Acquired short bowel syndrome 11/20/2003   Past Surgical History:  Procedure Laterality Date  . APPENDECTOMY  yrs ago  . CHOLECYSTECTOMY  yrs ago  . COLONOSCOPY  12/05/2005   internal hemorrhoids (for polyp surveillance)  . COLONOSCOPY  04/26/2012   Procedure: COLONOSCOPY;  Surgeon: Gatha Mayer, MD;   Location: [facility];  Service: Endoscopy;  Laterality: N/A;  . COLONOSCOPY WITH PROPOFOL N/A 03/18/2016   Procedure: COLONOSCOPY WITH PROPOFOL;  Surgeon: Gatha Mayer, MD;  Location: [facility];  Service: Endoscopy;  Laterality: N/A;  . ESOPHAGOGASTRODUODENOSCOPY  01/22/2009   erosive esophagitis  . HARDWARE REMOVAL Left 12/31/2017   Procedure: HARDWARE REMOVAL;  Surgeon: Rod Can, MD;  Location: [facility];  Service: Orthopedics;  Laterality: Left;  . INTRAMEDULLARY (IM) NAIL INTERTROCHANTERIC Left 12/31/2017   Procedure: INTRAMEDULLARY (IM) NAIL SUBTROCHANTRIC;  Surgeon: Rod Can, MD;  Location: [facility];  Service: Orthopedics;  Laterality: Left;  . IR CV LINE INJECTION  03/23/2018  . IR FLUORO GUIDE CV LINE LEFT  01/01/2018  . IR FLUORO GUIDE CV LINE LEFT  03/24/2018  . IR FLUORO GUIDE CV LINE LEFT  05/21/2018  . IR FLUORO GUIDE CV LINE LEFT  07/09/2018  . IR GENERIC HISTORICAL  07/04/2016   IR REMOVAL TUN CV CATH W/O FL 07/04/2016 Ascencion Dike, PA-C [facility]  . IR GENERIC HISTORICAL  07/10/2016   IR US GUIDE VASC ACCESS LEFT 07/10/2016 Arne Cleveland, MD [facility]  . IR GENERIC HISTORICAL  07/10/2016   IR FLUORO GUIDE CV LINE LEFT 07/10/2016 Arne Cleveland, MD [facility]  . IR PATIENT EVAL TECH 0-60 MINS  10/07/2017  . IR PATIENT EVAL TECH 0-60 MINS  03/09/2018  . IR RADIOLOGIST EVAL & MGMT  07/21/2017  . ORIF PROXIMAL FEMORAL FRACTURE W/ ITST NAIL SYSTEM  03/2007   left, Dr. Shellia Carwin  . SMALL INTESTINE SURGERY  2005   multiple with right colon resection for ischemia/infarct   Family History  Problem  Relation Age of Onset  . Diabetes Mother   . Hypertension Mother   . AAA (abdominal aortic aneurysm) Mother   . Colon cancer Neg Hx   . Stomach cancer Neg Hx    Allergies  Allergen Reactions  . Penicillins Itching, Swelling and Other (See Comments)    Reaction:  Facial swelling Has patient had a PCN reaction causing immediate rash, facial/tongue/throat swelling, SOB  or lightheadedness with hypotension: Yes Has patient had a PCN reaction causing severe rash involving mucus membranes or skin necrosis: No Has patient had a PCN reaction that required hospitalization No Has patient had a PCN reaction occurring within the last 10 years: No If all of the above answers are "NO", then may proceed with Cephalosporin use.   Outpatient Encounter Medications as of 04/13/2020  Medication Sig  . ADULT TPN 1,800 mLs 4 (four) times a week. Pt receives home TPN from [facility]:  1800 mL bag, four nights weekly (Monday, Tuesday, Wednesday, Thursday for 8 hours (includes 1 hour taper up and down).  . cetirizine (ZYRTEC) 10 MG tablet Take 1 tablet (10 mg total) by mouth daily.  . fluticasone (FLONASE) 50 MCG/ACT nasal spray Place 2 sprays into both nostrils daily.  Marland Kitchen omeprazole (PRILOSEC) 40 MG capsule TAKE 1 CAPSULE 2 (TWO) TIMES DAILY BY MOUTH. TAKE BEFORE BREAKFAST AND SUPPER  . ondansetron (ZOFRAN-ODT) 8 MG disintegrating tablet TAKE 1 TABLET (8 MG TOTAL) BY MOUTH EVERY 8 (EIGHT) HOURS AS NEEDED FOR NAUSEA OR VOMITING.  . sacubitril-valsartan (ENTRESTO) 24-26 MG Take 1 tablet by mouth 2 (two) times daily.  . Teduglutide, rDNA, (GATTEX) 5 MG KIT Inject 3.3 Units into the skin daily.  . vitamin E 400 UNIT capsule Take 400 Units by mouth daily.   [name] Reasons 20 MG TABS tablet TAKE 1 TABLET (20 MG TOTAL) BY MOUTH DAILY WITH SUPPER.  Marland Kitchen acetaminophen (TYLENOL) 500 MG tablet Take 1,000 mg by mouth daily as needed for mild pain.  . clobetasol ointment (TEMOVATE) 7.82 % Apply 1 application topically 2 (two) times daily.  . diphenhydrAMINE (BENADRYL) 25 mg capsule Take 25-50 mg by mouth every 6 (six) hours as needed for itching or sleep.   . Multiple Vitamin (MULTIVITAMIN WITH MINERALS) TABS Take 1 tablet by mouth daily.  Marland Kitchen PRESCRIPTION MEDICATION 5 mLs See admin instructions. 50 units Heparin sodium flush  . sodium chloride 0.9 % infusion Uses for TPN  . [DISCONTINUED] albuterol  (VENTOLIN HFA) 108 (90 Base) MCG/ACT inhaler TAKE 2 PUFFS BY MOUTH EVERY 6 HOURS AS NEEDED FOR WHEEZE OR SHORTNESS OF BREATH  . [DISCONTINUED] ciprofloxacin (CIPRO) 250 MG tablet Take 1 tablet (250 mg total) by mouth 2 (two) times daily.   No facility-administered encounter medications on file as of 04/13/2020.   Patient Care Team    Relationship Specialty Notifications Start End  [name], MD PCP - General   04/17/10   Gatha Mayer, MD Consulting Physician Gastroenterology All results, Admissions 11/20/10   Nicholas Lose, MD Consulting Physician Hematology and Oncology  08/14/17   Madelin Rear, [facility] Pharmacist Pharmacist  03/23/20    Comment: [phone]   Current Diagnosis/Assessment: Goals Addressed            This Visit's Progress   . Patient [place] (see longitudinal plan of care for additional care plan information)  Current Barriers:  . Chronic Disease Management support, education, and care coordination needs related to heart failure, Osteoporosis and DVT  history/chronic anticoagulation   Heart Failure BP Readings from Last 3 Encounters:  02/10/20 110/70  01/20/20 133/78  01/02/20 112/70   . Pharmacist Clinical Goal(s): o Over the next 365 days, patient will work with PharmD and providers to ensure medication safety and accessibility and maintain BP goal <130/80 . Current regimen:  o Entresto 24-26 mg once daily  . Interventions: o We discussed:  Patient Assistance Program for [place] - agreed to move forward with application. . Patient self care activities - Over the next 365 days, patient will: o Continue current management o Notify provider of weight gain of 3 lb in one day Osteoporosis . Pharmacist Clinical Goal(s) o Over the next 90 days, patient will work with PharmD and providers to ensure medication safety and maintenance therapy for osteoporosis  . Current regimen:  o Calcium/vitamin d3  supplementation . Interventions: o Reviewed Prolia side effects and counseled on general use - agree to move forward with this  o We discussed: Recommend weight-bearing and muscle strengthening exercises for building and maintaining bone density.  . Patient self care activities - Over the next 90 days, patient will: o Follow-up for any labs if requested by PCP o Fill out any required documentation for patient assistance applications as appropriate  DVT history/chronic anticoagulation . Pharmacist Clinical Goal(s) o Over the next 365 days, patient will work with PharmD and providers to ensure medication safety and accessibility . Current regimen:  o Xarelto 20 mg once daily with supper . Interventions: o Reviewed side effects including signs of bleed o Reviewed patient assistance program for Xarelto - will pursue together. . Patient self care activities - Over the next 365 days, patient will: o Continue current management  Medication management . Pharmacist Clinical Goal(s): o Over the next 365 days, patient will work with PharmD and providers to maintain optimal medication adherence . [facility] [place] . Interventions o Comprehensive medication review performed. o Continue current medication management strategy . Patient self care activities - Over the next 365 days, patient will: o Take medications as prescribed o Report any questions or concerns to PharmD and/or provider(s) Initial goal documentation.      DVT Hx/chronic anticoagulation   Pulse Readings from Last 3 Encounters:  02/10/20 63  01/20/20 76  01/02/20 65   Previously on coumadin - taken off when GATEX started. Was off coumadin for several years before DVT.   Denies any abnormal bruising, bleeding from nose or gums or blood in urine or stool. Patient is currently controlled on the following medications:  . Xarelto 20 mg daily with supper  Reviewed and discussed side effects.  Reviewed patient  assistance program for Xarelto - will pursue together.  Plan  Continue current medications. Xarelto PAP.  HF   Type: Systolic. Last ejection fraction: 20-25% (03/2020). Reports some dizziness at first initiated entresto, no issues or side effects noted today. Patient has failed these meds in past: n/a. Patient is currently on the following medications:  . Entresto 24-26 mg once daily   We discussed:  Patient Assistance Program for [place] - agreed to move forward with application.  Plan  Continue current medications.   GERD   Lab Results  Component Value Date   [id number] 04/09/2020   Avoids extremely spicy foods Patient denies recent acid reflux.  Currently controlled on: . Omeprazole 40 mg  twice daily   We discussed: Avoidance of potential triggers such as alcohol, fatty foods, lying down after eating, and tomato sauce.  Plan   Continue current medications.  Short Bowel Syndrome   Patient is currently controlled on the following medications:  . Teduglutide ([place]): 3.3 units into the skin daily  We discussed:  Patient assistance - already approved and is currently receiving through [facility]  Plan  Continue current medications  Osteoporosis   Last DEXA Scan: 12/2019 - OP. Declined prolia at that time.    Lumbar spine L1-L4 Femoral neck (FN) 33% distal radius  T-score -2.2 RFN: -3.6 LFN:  n/a  Change in BMD from previous DXA test (%) Down 9.8% Down 12.9% n/a   VITD  Date Value Ref Range Status  07/04/2019 10.39 (L) 30.00 - 100.00 ng/mL Final   Lab Results  Component Value Date   MG 1.8 04/09/2020   MG 1.8 03/05/2020   MG 1.7 01/09/2020   PHOS 4.0 04/09/2020   PHOS 3.7 03/05/2020   PHOS 3.8 01/09/2020   CALCIUM 8.3 (L) 04/09/2020   CALCIUM 8.5 03/05/2020   CALCIUM 8.7 01/09/2020   GFR 55.31 (L) 04/09/2020   GFR 60.23 03/05/2020   GFR 58.92 (L) 01/09/2020   Previous medications: reclast injections 2012. Short bowel syndrome. Patient is a  candidate for pharmacologic treatment due to T-Score < -2.5 in femoral neck.  Current medications:  citrical w/ d3 - 500 units-25 mcg twice daily  Vitamin D3 1000 units 3 drops daily  Reviewed Prolia side effects and counseled on general use - agree to move forward with this  We discussed: Recommend weight-bearing and muscle strengthening exercises for building and maintaining bone density.   Plan  Continue current medications. RPH to discuss starting prolia with PCP  Vaccines   Immunization History  Administered Date(s) Administered  . Fluad Quad(high Dose 65+) 01/26/2019, 02/03/2020  . Hep A / Hep B 01/10/2009, 01/17/2009, 02/07/2009, 02/11/2010  . Influenza Split 04/08/2011, 02/19/2012  . Influenza Whole 02/20/2009, 03/14/2010  . Influenza, High Dose Seasonal PF 02/08/2018  . Influenza,inj,Quad PF,6+ Mos 02/09/2013, 02/28/2014, 02/02/2015, 01/31/2016, 02/12/2017  . PFIZER SARS-COV-2 Vaccination 06/01/2019, 06/22/2019  . Pneumococcal Conjugate-13 04/07/2017  . Pneumococcal Polysaccharide-23 02/20/2009, 01/26/2019   Reviewed and discussed patient's vaccination history.  Plan  Recommended patient receive Shingrix, Tdap in pharmacy.   Medication Management / Care Coordination   [facility] [place] [place] [address] Phone: [phone] Fax: [phone]   Denies any issues with current medication management.   Plan  Continue current medication management strategy. ___________________________ SDOH (Social Determinants of Health) assessments performed: Yes.  Future Appointments  Date Time Provider [place]  10/12/2020  3:30 PM [facility] CCM PHARMACIST [facility]     Visit follow-up:  . CPA follow-up: PAP for Xarelto, Prolia, Entresto. Marland Kitchen RPH follow-up: 6 month telephone visit.  Madelin Rear, Pharm.D., BCGP Clinical Pharmacist [facility] [phone]

## 2020-04-17 ENCOUNTER — Telehealth: Payer: Self-pay

## 2020-04-24 ENCOUNTER — Telehealth: Payer: Self-pay

## 2020-04-24 DIAGNOSIS — R9430 Abnormal result of cardiovascular function study, unspecified: Secondary | ICD-10-CM

## 2020-04-24 LAB — FATTY ACID PANEL, ESSENTIAL (C12-C22), SERUM
Arachidic, C20:0: 14.73 umol/L — ABNORMAL LOW (ref 16.8–38.5)
Arachidonic, C20:4w6: 558.77 umol/L (ref 351.4–1057)
DHA, C22:6w3: 67.48 umol/L (ref 53.3–216.3)
DPA, C22:5w3: 51.07 umol/L (ref 19.9–77.6)
DPA, C22:5w6: 11.35 umol/L (ref 9.2–32.1)
DTA, C22:4w6: 19.69 umol/L (ref 9.1–44.6)
Docosenoic, C22:1: 6.81 umol/L (ref 5.73–11.92)
EPA, C20:5w3: 33.28 umol/L (ref 12.4–123)
Hexadecenoic, C16:1w9: 122.04 umol/L — ABNORMAL HIGH (ref 19.82–59.9)
LAURIC,C12:0: 15.83 umol/L (ref 4.3–36)
Linoleic, C18:2w6: 3098.8 umol/L (ref 2653.4–613)
Mead, C20:3w9: 15.4 umol/L (ref 10.3–41.3)
Myristic, C14:0: 174.95 umol/L (ref 39.4–258.2)
Nervonic, C24:1w9: 112.78 umol/L (ref 56.9–132.7)
Oleic, C18:1w9: 4112.36 umol/L (ref 872.4–4182)
Palmitic, C16:0: 3912.75 umol/L (ref 1671.1–460)
Palmitoleic, C16:1w7: 634.7 umol/L — ABNORMAL HIGH (ref 68.5–570.2)
Stearic, C18:0: 1098.23 umol/L (ref 590.2–1377)
Total Fatty Acids: 14.9 umol/L (ref 7.93–18.34)
Total Monounsaturated: 5.42 umol/L — ABNORMAL HIGH (ref 1.45–4.95)
Total Polyunsaturated: 4.18 umol/L (ref 3.57–8.11)
Total Saturated: 5.29 umol/L (ref 2.5–6.4)
Total w3: 0.25 umol/L (ref 0.1–0.5)
Total w6: 3.89 umol/L (ref 3.3–7.1)
Triene Tetraene Ratio: 0.03 (ref 0.02–0.05)
Vaccenic, C18:1w7: 400.28 umol/L — ABNORMAL HIGH (ref 84.8–260.8)
a Linolenic, C18:3w3: 103.02 umol/L (ref 26.1–150.1)
g Linolenic, C18:3w6: 57.81 umol/L (ref 14.5–144.9)
h g-Linolenic, C20:3w6: 146.44 umol/L (ref 70–319.9)

## 2020-04-24 LAB — PREALBUMIN: Prealbumin: 14 mg/dL — ABNORMAL LOW (ref 17–34)

## 2020-04-24 NOTE — Telephone Encounter (Signed)
Patient does not have an appointment with CHF clinic until February. Will have patient follow up with PharmD for titration of entresto.

## 2020-04-24 NOTE — Telephone Encounter (Signed)
-----   Message from Josue Hector, MD sent at 03/14/2020  2:27 PM EDT ----- EF bad again have her start entresto lowest dose and f/u with CHF clinic she needs right and left cath after being on medical Rx for a while

## 2020-04-26 ENCOUNTER — Ambulatory Visit (INDEPENDENT_AMBULATORY_CARE_PROVIDER_SITE_OTHER): Payer: Medicare Other | Admitting: Pharmacist

## 2020-04-26 ENCOUNTER — Other Ambulatory Visit: Payer: Self-pay

## 2020-04-26 ENCOUNTER — Encounter: Payer: Self-pay | Admitting: Pharmacist

## 2020-04-26 VITALS — BP 116/60 | HR 52 | Wt 150.6 lb

## 2020-04-26 DIAGNOSIS — I5022 Chronic systolic (congestive) heart failure: Secondary | ICD-10-CM

## 2020-04-26 DIAGNOSIS — I6523 Occlusion and stenosis of bilateral carotid arteries: Secondary | ICD-10-CM | POA: Diagnosis not present

## 2020-04-26 MED ORDER — ENTRESTO 49-51 MG PO TABS: 1.0000 | ORAL_TABLET | Freq: Two times a day (BID) | ORAL | 0 refills | Status: DC

## 2020-04-26 NOTE — Patient Instructions (Addendum)
It was nice meeting you today!  We would like to keep your blood pressure less than 130/80.  Try to check it at home a few days a week.    We will increase your Entresto to the next dose which is 49/51mg  twice a day  Please start weighing yourself daily and let us know if you gain more than 3 pounds overnight or 5 pounds in a week. Watch for signs of fluid retention.    Watch your sodium intake and concentrate on vegetables, lean proteins, and healthy fats.  Karren Cobble, PharmD, BCACP, [place], [place] [address] [place], [place] [address] Phone: [phone]; Fax: [phone] 04/26/2020 9:38 AM

## 2020-04-26 NOTE — Progress Notes (Signed)
0.Patient ID: Noble C Heggs                 DOB: 1950-06-26                      MRN: [id number]     HPI: [name] [name] is a 69 y.o. female referred by Dr. Johnsie Cancel for CHF management. 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   Had echo on 03/14/20 and LVEF estimated at 20-25%.  Patient placed on low dose Entresto and plans for right and left cath.  Has referral to CHF clinic in February.  Patient has short bowel syndrome treated with Gattex and TPN.  Has central line and receives TPN overnight on Monday, Tuesday, Wednesday, and Thursday. IS off TPN on Fri/Sat/Sun. Also supplements with multivitamins and calcium.  Receives monthly lab draws typically on the last Monday of the month.    Most recent labs on 04/09/20.  K 3.4, Scr 1.03, GFR 55.31, Ca 8.3  Patient presents to day with husband of 8 years. Has birthday next week and granddaughter is graduating from [employer] to be a Occupational hygienist. Is overall in good spirits. Reports no shortness of breath, chest pain, LEE, or dyspnea on exertion.  Walks dog with husband most days.    Despite TPN, patient says she has an excellent appetite and eats most anything.  Says she likes comfort foods.    Has a scale and BP cuff at home but has not been using them yet.  Patient would like frequent follow up until appt with CHF clinic. Reported nausea when she started [facility] but that has since passed and is tolerating well. Does not think she has had any increased urination. Has been crushing tablets due to her difficulty in absorbing tablets.  Current HTN meds: Entresto 24/26 BID Previously tried: carvedilol 3.125, isosorbide 75m, losartan 25  BP goal: <130/80  Family History: no significant  cardiac history patient knows of  Social History: quit smoking 15 years ago, no alcohol  Diet: salads, seafood, steak, bbq      Exercise: walks dog  Home BP readings:  n/s  Wt Readings from Last 3 Encounters:  02/10/20 144 lb 4.8 oz (65.5 kg)  01/20/20 144 lb (65.3 kg)  01/02/20 138 lb (62.6 kg)   BP Readings from Last 3 Encounters:  02/10/20 110/70  01/20/20 133/78  01/02/20 112/70   Pulse Readings from Last 3 Encounters:  02/10/20 63  01/20/20 76  01/02/20 65    Renal function: CrCl cannot be calculated (Unknown ideal weight.).  Past Medical History:  Diagnosis Date  . Abnormal LFTs 2013  . Allergic rhinosinusitis   . Anemia of chronic disease   . At risk for dental problems   . Atypical nevus   . Bacteremia due to Klebsiella pneumoniae 12/12/2011  . Bacterial overgrowth syndrome   . Brachial vein thrombus, left ([facility] 10/08/2012  . 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 Carotid stenosis    Carotid [place](9/15):  R 40-59%; L 1-39% >> FU 1 year  . Closed left subtrochanteric femur fracture ([facility] 12/31/2017  . Congestive heart failure (CHF) (HCC)    EF 25-30%  . 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 Fracture of left clavicle   .  History of blood transfusion 2013   anemia  . Hx of cardiovascular stress test    Myoview (9/15):  inf-apical scar; no ischemia; EF 47% - low risk   . 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 Osteoporosis   . Pancytopenia 10/07/2011  . Pathologic fracture of neck of femur ([place])   . Personal history of colonic polyps   . Renal insufficiency    hx of yrs ago  . Serratia marcescens infection - bactermia assoc w/ PICC 01/18/2015  . 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 Splenomegaly    By ultrasound  . Thrombophilia ([place])   . Vitamin D deficiency     Current Outpatient Medications on File Prior to Visit  Medication Sig Dispense Refill  . acetaminophen (TYLENOL) 500 MG  tablet Take 1,000 mg by mouth daily as needed for mild pain.    . ADULT TPN 1,800 mLs 4 (four) times a week. Pt receives home TPN from [facility]:  1800 mL bag, four nights weekly (Monday, Tuesday, Wednesday, Thursday for 8 hours (includes 1 hour taper up and down).    . cetirizine (ZYRTEC) 10 MG tablet Take 1 tablet (10 mg total) by mouth daily. 30 tablet 11  . clobetasol ointment (TEMOVATE) 9.67 % Apply 1 application topically 2 (two) times daily. 30 g 0  . denosumab (PROLIA) 60 MG/ML SOSY injection Inject 60 mg into the skin every 6 (six) months.    . diphenhydrAMINE (BENADRYL) 25 mg capsule Take 25-50 mg by mouth every 6 (six) hours as needed for itching or sleep.     . fluticasone (FLONASE) 50 MCG/ACT nasal spray Place 2 sprays into both nostrils daily. 16 g 6  . Multiple Vitamin (MULTIVITAMIN WITH MINERALS) TABS Take 1 tablet by mouth daily.    Marland Kitchen omeprazole (PRILOSEC) 40 MG capsule TAKE 1 CAPSULE 2 (TWO) TIMES DAILY BY MOUTH. TAKE BEFORE BREAKFAST AND SUPPER 180 capsule 1  . ondansetron (ZOFRAN-ODT) 8 MG disintegrating tablet TAKE 1 TABLET (8 MG TOTAL) BY MOUTH EVERY 8 (EIGHT) HOURS AS NEEDED FOR NAUSEA OR VOMITING. 30 tablet 11  . PRESCRIPTION MEDICATION 5 mLs See admin instructions. 50 units Heparin sodium flush    . sacubitril-valsartan (ENTRESTO) 24-26 MG Take 1 tablet by mouth 2 (two) times daily. 60 tablet 11  . sodium chloride 0.9 % infusion Uses for TPN    . Teduglutide, rDNA, (GATTEX) 5 MG KIT Inject 3.3 Units into the skin daily. 1 kit 11  . vitamin E 400 UNIT capsule Take 400 Units by mouth daily.     [name] Reasons 20 MG TABS tablet TAKE 1 TABLET (20 MG TOTAL) BY MOUTH DAILY WITH SUPPER. 90 tablet 2   No current facility-administered medications on file prior to visit.    Allergies  Allergen Reactions  . Penicillins Itching, Swelling and Other (See Comments)    Reaction:  Facial swelling Has patient had a PCN reaction causing immediate rash, facial/tongue/throat swelling, SOB or  lightheadedness with hypotension: Yes Has patient had a PCN reaction causing severe rash involving mucus membranes or skin necrosis: No Has patient had a PCN reaction that required hospitalization No Has patient had a PCN reaction occurring within the last 10 years: No If all of the above answers are "NO", then may proceed with Cephalosporin use.     Assessment/Plan:  1. CHF - Patient BP in room today 116/60 which is at goal of <130/80. Pulse slightly  low.  Recommended patient begin monitoring BP and HR at home and recording values.    Discussed signs and symptoms of HF exacerbations and the role medications and diet changes can assist in management.  Gave patient education handouts regarding HF and reducing salt consumption.  Recommened she begin weighing herself daily and reporting back significant weight gain overnight or over the week.  However this could be data could be complicated by patient's overnight TPN infusions four days a week.    Due to patient low HR, will not initiate beta blocker at this time.  Will increase Entresto to 49/51 BID. [name] is able to be prepared into an oral suspension which patient is interested in.  Will contact patient with information on how to make this or see if she prefers a Journalist, newspaper.    Lab work already scheduled in for 12/27.  Will recheck on 12/29.  Stop Entresto 24/26 BID Start Entresto 49/51 BID  Karren Cobble, PharmD, BCACP, CDCES, [place] [address], [place], [place] [address] Phone: [phone]; Fax: [phone] 04/26/2020 10:30 AM

## 2020-04-28 ENCOUNTER — Other Ambulatory Visit: Payer: Self-pay

## 2020-04-28 ENCOUNTER — Encounter (HOSPITAL_BASED_OUTPATIENT_CLINIC_OR_DEPARTMENT_OTHER): Payer: Self-pay | Admitting: *Deleted

## 2020-04-28 ENCOUNTER — Emergency Department (HOSPITAL_BASED_OUTPATIENT_CLINIC_OR_DEPARTMENT_OTHER)
Admission: EM | Admit: 2020-04-28 | Discharge: 2020-04-28 | Discharge status: Against Medical Advice | Payer: Medicare Other | Attending: Emergency Medicine | Admitting: Emergency Medicine

## 2020-04-28 DIAGNOSIS — Z86718 Personal history of other venous thrombosis and embolism: Secondary | ICD-10-CM | POA: Diagnosis not present

## 2020-04-28 DIAGNOSIS — Z7901 Long term (current) use of anticoagulants: Secondary | ICD-10-CM | POA: Diagnosis not present

## 2020-04-28 DIAGNOSIS — R6883 Chills (without fever): Secondary | ICD-10-CM | POA: Insufficient documentation

## 2020-04-28 DIAGNOSIS — R232 Flushing: Secondary | ICD-10-CM | POA: Diagnosis present

## 2020-04-28 DIAGNOSIS — Z87891 Personal history of nicotine dependence: Secondary | ICD-10-CM | POA: Diagnosis not present

## 2020-04-28 DIAGNOSIS — R61 Generalized hyperhidrosis: Secondary | ICD-10-CM | POA: Insufficient documentation

## 2020-04-28 LAB — CBC WITH DIFFERENTIAL/PLATELET
Abs Immature Granulocytes: 0.01 10*3/uL (ref 0.00–0.07)
Basophils Absolute: 0 10*3/uL (ref 0.0–0.1)
Basophils Relative: 1 %
Eosinophils Absolute: 0.5 10*3/uL (ref 0.0–0.5)
Eosinophils Relative: 10 %
HCT: 31.8 % — ABNORMAL LOW (ref 36.0–46.0)
Hemoglobin: 10.2 g/dL — ABNORMAL LOW (ref 12.0–15.0)
Immature Granulocytes: 0 %
Lymphocytes Relative: 35 %
Lymphs Abs: 1.7 10*3/uL (ref 0.7–4.0)
MCH: 31.9 pg (ref 26.0–34.0)
MCHC: 32.1 g/dL (ref 30.0–36.0)
MCV: 99.4 fL (ref 80.0–100.0)
Monocytes Absolute: 0.7 10*3/uL (ref 0.1–1.0)
Monocytes Relative: 16 %
Neutro Abs: 1.9 10*3/uL (ref 1.7–7.7)
Neutrophils Relative %: 38 %
Platelets: 112 10*3/uL — ABNORMAL LOW (ref 150–400)
RBC: 3.2 MIL/uL — ABNORMAL LOW (ref 3.87–5.11)
RDW: 15.1 % (ref 11.5–15.5)
Smear Review: NORMAL
WBC: 4.8 10*3/uL (ref 4.0–10.5)
nRBC: 0 % (ref 0.0–0.2)

## 2020-04-28 LAB — COMPREHENSIVE METABOLIC PANEL
ALT: 23 U/L (ref 0–44)
AST: 25 U/L (ref 15–41)
Albumin: 2.8 g/dL — ABNORMAL LOW (ref 3.5–5.0)
Alkaline Phosphatase: 108 U/L (ref 38–126)
Anion gap: 8 (ref 5–15)
BUN: 17 mg/dL (ref 8–23)
CO2: 19 mmol/L — ABNORMAL LOW (ref 22–32)
Calcium: 8.1 mg/dL — ABNORMAL LOW (ref 8.9–10.3)
Chloride: 111 mmol/L (ref 98–111)
Creatinine, Ser: 1.1 mg/dL — ABNORMAL HIGH (ref 0.44–1.00)
GFR, Estimated: 54 mL/min — ABNORMAL LOW (ref >60)
Glucose, Bld: 85 mg/dL (ref 70–99)
Potassium: 3.3 mmol/L — ABNORMAL LOW (ref 3.5–5.1)
Sodium: 138 mmol/L (ref 135–145)
Total Bilirubin: 0.5 mg/dL (ref 0.3–1.2)
Total Protein: 7 g/dL (ref 6.5–8.1)

## 2020-04-28 LAB — URINALYSIS, MICROSCOPIC (REFLEX)

## 2020-04-28 LAB — URINALYSIS, ROUTINE W REFLEX MICROSCOPIC
Bilirubin Urine: NEGATIVE
Glucose, UA: NEGATIVE mg/dL
Ketones, ur: NEGATIVE mg/dL
Leukocytes,Ua: NEGATIVE
Nitrite: NEGATIVE
Protein, ur: NEGATIVE mg/dL
Specific Gravity, Urine: 1.015 (ref 1.005–1.030)
pH: 6 (ref 5.0–8.0)

## 2020-04-28 LAB — LACTIC ACID, PLASMA: Lactic Acid, Venous: 0.8 mmol/L (ref 0.5–1.9)

## 2020-04-28 MED ORDER — SULFAMETHOXAZOLE-TRIMETHOPRIM 800-160 MG PO TABS: 1.0000 | ORAL_TABLET | Freq: Two times a day (BID) | ORAL | 0 refills | Status: AC

## 2020-04-28 MED ORDER — SULFAMETHOXAZOLE-TRIMETHOPRIM 800-160 MG PO TABS
2.0000 | ORAL_TABLET | Freq: Once | ORAL | Status: AC
  Administered 2020-04-28: 2 via ORAL
  Filled 2020-04-28: qty 2

## 2020-04-28 NOTE — ED Notes (Signed)
ED Provider at bedside. 

## 2020-04-28 NOTE — ED Triage Notes (Signed)
Patient has a central line to her left subclavian since July 2020.  Patient has been infusing the TPN..  Patient has chills and night sweats and stated that she might have an infection per previous experience.

## 2020-04-28 NOTE — ED Provider Notes (Signed)
[facility] EMERGENCY DEPARTMENT Provider Note   CSN: [id number] Arrival date & time: 04/28/20  1031     History No chief complaint on file.   [name] [name] is a 69 y.o. female.  69 yo F who had been found to have bacteremia likely 2/2 infected left subclavian central line back in 2020. Completed home antibiotic regimen. Had been fine.  The last two nights she has noticed that she has had episodic flushing, night sweats and febrile feeling after flushing her TPN.  This is similar to when she had infected line a year and half ago.  She has been afebrile in between and asymptomatic in between.  She presents here for further evaluation.        Past Medical History:  Diagnosis Date  . Abnormal LFTs 2013  . Allergic rhinosinusitis   . Anemia of chronic disease   . At risk for dental problems   . Atypical nevus   . Bacteremia due to Klebsiella pneumoniae 12/12/2011  . Bacterial overgrowth syndrome   . 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 Carotid stenosis    Carotid US (9/15):  R 40-59%; L 1-39% >> FU 1 year  . Closed left subtrochanteric femur fracture ([place]) 12/31/2017  . Congestive heart failure (CHF) (HCC)    EF 25-30%  . 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 Fracture of left clavicle   . History of blood transfusion 2013   anemia  . Hx of cardiovascular stress test    Myoview (9/15):  inf-apical scar; no ischemia; EF 47% - low risk   . 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 Osteoporosis   . Pancytopenia 10/07/2011  . Pathologic fracture of neck of femur ([place])   . Personal history of colonic polyps   . Renal insufficiency    hx of yrs ago  . Serratia marcescens infection - bactermia assoc w/ PICC 01/18/2015  . 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 Splenomegaly    By ultrasound  . Thrombophilia ([place])   . Vitamin D deficiency      Patient Active Problem List   Diagnosis Date Noted  . 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 Hypotension 12/30/2017  . Bradycardia 12/30/2017  . 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 Degenerative joint disease 07/08/2016  . PICC line infection 05/16/2015  . 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 Splenomegaly   . Hx of adenomatous colonic polyps 04/18/2012  . Chronic systolic heart failure ([place]) 12/25/2011  . Osteoporosis 03/14/2010  . Anemia of chronic disease 10/01/2009  . GERD (gastroesophageal reflux disease) 11/19/2008  . ALKALINE PHOSPHATASE, ELEVATED 10/12/2008  . VITAMIN D DEFICIENCY 01/19/2008  . TRANSAMINASES, SERUM, ELEVATED 01/19/2008  . Bacterial overgrowth syndrome 11/19/2004  . Acquired short bowel syndrome 11/20/2003    Past Surgical History:  Procedure Laterality Date  . APPENDECTOMY  yrs ago  . CHOLECYSTECTOMY  yrs ago  . COLONOSCOPY  12/05/2005   internal hemorrhoids (for polyp surveillance)  . COLONOSCOPY  04/26/2012   Procedure: COLONOSCOPY;  Surgeon: Gatha Mayer, MD;  Location: [facility];  Service: Endoscopy;  Laterality: N/A;  . COLONOSCOPY WITH PROPOFOL N/A 03/18/2016   Procedure: COLONOSCOPY WITH PROPOFOL;  Surgeon: Gatha Mayer, MD;  Location: [facility];  Service: Endoscopy;  Laterality: N/A;  . ESOPHAGOGASTRODUODENOSCOPY  01/22/2009  erosive esophagitis  . HARDWARE REMOVAL Left 12/31/2017   Procedure: HARDWARE REMOVAL;  Surgeon: Rod Can, MD;  Location: [facility];  Service: Orthopedics;  Laterality: Left;  . INTRAMEDULLARY (IM) NAIL INTERTROCHANTERIC Left 12/31/2017   Procedure: INTRAMEDULLARY (IM) NAIL SUBTROCHANTRIC;  Surgeon: Rod Can, MD;  Location: [facility];  Service: Orthopedics;  Laterality: Left;  . IR CV LINE INJECTION  03/23/2018  . IR FLUORO GUIDE CV LINE LEFT  01/01/2018  . IR FLUORO GUIDE CV LINE LEFT  03/24/2018  . IR FLUORO GUIDE CV LINE  LEFT  05/21/2018  . IR FLUORO GUIDE CV LINE LEFT  07/09/2018  . IR GENERIC HISTORICAL  07/04/2016   IR REMOVAL TUN CV CATH W/O FL 07/04/2016 Ascencion Dike, PA-C [facility]  . IR GENERIC HISTORICAL  07/10/2016   IR US GUIDE VASC ACCESS LEFT 07/10/2016 Arne Cleveland, MD [facility]  . IR GENERIC HISTORICAL  07/10/2016   IR FLUORO GUIDE CV LINE LEFT 07/10/2016 Arne Cleveland, MD [facility]  . IR PATIENT EVAL TECH 0-60 MINS  10/07/2017  . IR PATIENT EVAL TECH 0-60 MINS  03/09/2018  . IR RADIOLOGIST EVAL & MGMT  07/21/2017  . ORIF PROXIMAL FEMORAL FRACTURE W/ ITST NAIL SYSTEM  03/2007   left, Dr. Shellia Carwin  . SMALL INTESTINE SURGERY  2005   multiple with right colon resection for ischemia/infarct     OB History    Gravida  2   Para  2   Term      Preterm      AB      Living  1     SAB      IAB      Ectopic      Multiple      Live Births              Family History  Problem Relation Age of Onset  . Diabetes Mother   . Hypertension Mother   . AAA (abdominal aortic aneurysm) Mother   . Colon cancer Neg Hx   . Stomach cancer Neg Hx     Social History   Tobacco Use  . Smoking status: Former Smoker    Types: Cigarettes    Quit date: 05/12/2004    Years since quitting: 15.9  . Smokeless tobacco: Never Used  Vaping Use  . Vaping Use: Never used  Substance Use Topics  . Alcohol use: No  . Drug use: No    Home Medications Prior to Admission medications   Medication Sig Start Date End Date Taking? Authorizing Provider  acetaminophen (TYLENOL) 500 MG tablet Take 1,000 mg by mouth daily as needed for mild pain.    [provider]  ADULT TPN 1,800 mLs 4 (four) times a week. Pt receives home TPN from [facility]:  1800 mL bag, four nights weekly (Monday, Tuesday, Wednesday, Thursday for 8 hours (includes 1 hour taper up and down).    [provider]  calcium citrate-vitamin D (CITRACAL+D) 315-200 MG-UNIT tablet Take 1 tablet by mouth 2 (two) times  daily.    [provider]  cetirizine (ZYRTEC) 10 MG tablet Take 1 tablet (10 mg total) by mouth daily. 12/02/19   [name], MD  ciprofloxacin (CIPRO) 500 MG tablet Take by mouth at bedtime. As needed for bacterial overgrowth 03/07/20   [provider]  clobetasol ointment (TEMOVATE) 6.60 % Apply 1 application topically 2 (two) times daily. 12/23/18   Thayer Headings, MD  denosumab (PROLIA) 60 MG/ML SOSY  injection Inject 60 mg into the skin every 6 (six) months. Patient not taking: Reported on 04/26/2020    [name], MD  diphenhydrAMINE (BENADRYL) 25 mg capsule Take 25-50 mg by mouth every 6 (six) hours as needed for itching or sleep.     [provider]  fluticasone (FLONASE) 50 MCG/ACT nasal spray Place 2 sprays into both nostrils daily. 12/02/19   [name], MD  loperamide (IMODIUM A-D) 2 MG tablet Take by mouth as needed.    [provider]  Multiple Vitamin (MULTIVITAMIN WITH MINERALS) TABS Take 1 tablet by mouth daily.    [provider]  omeprazole (PRILOSEC) 40 MG capsule TAKE 1 CAPSULE 2 (TWO) TIMES DAILY BY MOUTH. TAKE BEFORE BREAKFAST AND SUPPER 02/14/20   Gatha Mayer, MD  ondansetron (ZOFRAN-ODT) 8 MG disintegrating tablet TAKE 1 TABLET (8 MG TOTAL) BY MOUTH EVERY 8 (EIGHT) HOURS AS NEEDED FOR NAUSEA OR VOMITING. 12/05/19   Gatha Mayer, MD  PRESCRIPTION MEDICATION 5 mLs See admin instructions. 50 units Heparin sodium flush    [provider]  sacubitril-valsartan (ENTRESTO) 49-51 MG Take 1 tablet by mouth 2 (two) times daily. 04/26/20 05/26/20  Josue Hector, MD  sodium chloride 0.9 % infusion Uses for TPN 10/27/17   [provider]  sulfamethoxazole-trimethoprim (BACTRIM DS) 800-160 MG tablet Take 1 tablet by mouth 2 (two) times daily for 14 days. 04/28/20 05/12/20  Jerilynn Feldmeier, Corene Cornea, MD  Teduglutide, rDNA, (GATTEX) 5 MG KIT Inject 3.3 Units into the skin daily. 09/11/14   Gatha Mayer, MD   vitamin E 400 UNIT capsule Take 400 Units by mouth daily.     [provider]  XARELTO 20 MG TABS tablet TAKE 1 TABLET (20 MG TOTAL) BY MOUTH DAILY WITH SUPPER. 02/17/20   [name], MD    Allergies    Penicillins  Review of Systems   Review of Systems  All other systems reviewed and are negative.   Physical Exam Updated Vital Signs BP (!) 102/55 (BP Location: Right Arm)   Pulse (!) 55   Temp 97.8 F (36.6 C) (Oral)   Resp 16   Ht $R'5\' 8"'wS$  (1.727 m)   Wt 67.6 kg   LMP 02/26/2014   SpO2 100%   BMI 22.66 kg/m   Physical Exam Vitals and nursing note reviewed.  Constitutional:      Appearance: She is well-developed and well-nourished.  HENT:     Head: Normocephalic and atraumatic.     Nose: No congestion or rhinorrhea.     Mouth/Throat:     Mouth: Mucous membranes are moist.  Eyes:     Pupils: Pupils are equal, round, and reactive to light.  Cardiovascular:     Rate and Rhythm: Normal rate and regular rhythm.  Pulmonary:     Effort: No respiratory distress.     Breath sounds: No stridor.  Abdominal:     General: Abdomen is flat. There is no distension.  Musculoskeletal:     Cervical back: Normal range of motion.  Skin:    General: Skin is warm and dry.  Neurological:     General: No focal deficit present.     Mental Status: She is alert.     ED Results / Procedures / Treatments   Labs (all labs ordered are listed, but only abnormal results are displayed) Labs Reviewed  CBC WITH DIFFERENTIAL/PLATELET - Abnormal; Notable for the following components:      Result Value  RBC 3.20 (*)    Hemoglobin 10.2 (*)    HCT 31.8 (*)    Platelets 112 (*)    All other components within normal limits  COMPREHENSIVE METABOLIC PANEL - Abnormal; Notable for the following components:   Potassium 3.3 (*)    CO2 19 (*)    Creatinine, Ser 1.10 (*)    Calcium 8.1 (*)    Albumin 2.8 (*)    GFR, Estimated 54 (*)    All other components within normal limits   URINALYSIS, ROUTINE W REFLEX MICROSCOPIC - Abnormal; Notable for the following components:   Hgb urine dipstick TRACE (*)    All other components within normal limits  URINALYSIS, MICROSCOPIC (REFLEX) - Abnormal; Notable for the following components:   Bacteria, UA RARE (*)    All other components within normal limits  CULTURE, BLOOD (ROUTINE X 2)  CULTURE, BLOOD (ROUTINE X 2)  URINE CULTURE  LACTIC ACID, PLASMA    EKG None  Radiology No results found.  Procedures Procedures (including critical care time)  Medications Ordered in ED Medications  sulfamethoxazole-trimethoprim (BACTRIM DS) 800-160 MG per tablet 2 tablet (2 tablets Oral Given 04/28/20 1358)    ED Course  I have reviewed the triage vital signs and the nursing notes.  Pertinent labs & imaging results that were available during my care of the patient were reviewed by me and considered in my medical decision making (see chart for details).    MDM Rules/Calculators/A&P                          Patient here with concerns for an infected line.  Similar symptoms to when she had a last time.  I discussed with infectious disease and the recommendations are to do cultures and pull the line.  Patient refuses.  Patient also refuses admission to the hospital.  She also refuses to have cultures drawn from her line.  She is competent to make her own decisions (and husband doesn't feel the need to help convince her) so her second choice of decision will be to do Bactrim fo a couple times a day for 2 weeks.  Follow-up with infectious disease as soon as possible.  Return here if she changes her mind about getting line pulled or if anything worsens. Discharged AMA.   Final Clinical Impression(s) / ED Diagnoses Final diagnoses:  Chills    Rx / DC Orders ED Discharge Orders         Ordered    sulfamethoxazole-trimethoprim (BACTRIM DS) 800-160 MG tablet  2 times daily        04/28/20 1434           Harveen Flesch, Corene Cornea,  MD 04/30/20 [phone]

## 2020-04-29 LAB — URINE CULTURE: Culture: NO GROWTH

## 2020-05-03 ENCOUNTER — Ambulatory Visit (INDEPENDENT_AMBULATORY_CARE_PROVIDER_SITE_OTHER): Payer: Medicare Other | Admitting: Internal Medicine

## 2020-05-03 ENCOUNTER — Other Ambulatory Visit: Payer: Self-pay

## 2020-05-03 ENCOUNTER — Encounter: Payer: Self-pay | Admitting: Internal Medicine

## 2020-05-03 VITALS — Wt 153.6 lb

## 2020-05-03 DIAGNOSIS — Z95828 Presence of other vascular implants and grafts: Secondary | ICD-10-CM

## 2020-05-03 LAB — CULTURE, BLOOD (ROUTINE X 2)
Culture: NO GROWTH
Culture: NO GROWTH
Special Requests: ADEQUATE
Special Requests: ADEQUATE

## 2020-05-03 NOTE — Progress Notes (Signed)
[facility] for Infectious Disease  Patient Active Problem List   Diagnosis Date Noted  . 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 Hypotension 12/30/2017  . Bradycardia 12/30/2017  . 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 Degenerative joint disease 07/08/2016  . PICC line infection 05/16/2015  . 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 Splenomegaly   . Hx of adenomatous colonic polyps 04/18/2012  . Chronic systolic heart failure ([place]) 12/25/2011  . Osteoporosis 03/14/2010  . Anemia of chronic disease 10/01/2009  . GERD (gastroesophageal reflux disease) 11/19/2008  . ALKALINE PHOSPHATASE, ELEVATED 10/12/2008  . VITAMIN D DEFICIENCY 01/19/2008  . TRANSAMINASES, SERUM, ELEVATED 01/19/2008  . Bacterial overgrowth syndrome 11/19/2004  . Acquired short bowel syndrome 11/20/2003      Subjective:    Patient ID: [name] [name], female    DOB: Dec 04, 1950, 69 y.o.   MRN: [id number]  Chief Complaint  Patient presents with  . Follow-up    Blood culture results.    HPI:  [name] [name] is a 69 y.o. female short gut syndrome (hx several surgeries 2006 for small bowel resection -- blood clot?) and has been on tpn since 2007, here for f/u recent ed visit for subjective fever/chill  She is on xarelto chronically  Patient has had several clabsi before and has had line removed/exchanged, and told me she has difficulty with line placement. She currently has left chest port 1.5 year ago a that time had another bsi (after the port placed). It was klebsiella pna. She decided to keep the port and has had no problem after a course of systemic antibiotics.  She was seen in ED on 12/18 with subjective chill only the same day when she tried to hook up tpn. She had no fever in the ed either. The wbc was normal. blcx taken and given bactrim.  She is here for f/u. The bcx was negative She felt fine after the ed visit  even before taken bactrim  She is feeling 100% baseline. Has chronic RUE swelling not worse. The port site is without pain/redness/swelling. She has continued tpn without issue.   Allergies  Allergen Reactions  . Penicillins Itching, Swelling and Other (See Comments)    Reaction:  Facial swelling Has patient had a PCN reaction causing immediate rash, facial/tongue/throat swelling, SOB or lightheadedness with hypotension: Yes Has patient had a PCN reaction causing severe rash involving mucus membranes or skin necrosis: No Has patient had a PCN reaction that required hospitalization No Has patient had a PCN reaction occurring within the last 10 years: No If all of the above answers are "NO", then may proceed with Cephalosporin use.      Outpatient Medications Prior to Visit  Medication Sig Dispense Refill  . acetaminophen (TYLENOL) 500 MG tablet Take 1,000 mg by mouth daily as needed for mild pain.    . calcium citrate-vitamin D (CITRACAL+D) 315-200 MG-UNIT tablet Take 1 tablet by mouth 2 (two) times daily.    . cetirizine (ZYRTEC) 10 MG tablet Take 1 tablet (10 mg total) by mouth daily. 30 tablet 11  . clobetasol ointment (TEMOVATE) 2.95 % Apply 1 application topically 2 (two) times daily. 30 g 0  . diphenhydrAMINE (BENADRYL) 25 mg capsule Take 25-50 mg by mouth every 6 (six) hours as needed for itching or sleep.     . fluticasone (FLONASE) 50 MCG/ACT nasal spray Place 2 sprays into both nostrils daily. [place]  g 6  . Multiple Vitamin (MULTIVITAMIN WITH MINERALS) TABS Take 1 tablet by mouth daily.    Marland Kitchen omeprazole (PRILOSEC) 40 MG capsule TAKE 1 CAPSULE 2 (TWO) TIMES DAILY BY MOUTH. TAKE BEFORE BREAKFAST AND SUPPER 180 capsule 1  . ondansetron (ZOFRAN-ODT) 8 MG disintegrating tablet TAKE 1 TABLET (8 MG TOTAL) BY MOUTH EVERY 8 (EIGHT) HOURS AS NEEDED FOR NAUSEA OR VOMITING. 30 tablet 11  . PRESCRIPTION MEDICATION 5 mLs See admin instructions. 50 units Heparin sodium flush    .  sacubitril-valsartan (ENTRESTO) 49-51 MG Take 1 tablet by mouth 2 (two) times daily. 60 tablet 0  . sodium chloride 0.9 % infusion Uses for TPN    . sulfamethoxazole-trimethoprim (BACTRIM DS) 800-160 MG tablet Take 1 tablet by mouth 2 (two) times daily for 14 days. 28 tablet 0  . Teduglutide, rDNA, (GATTEX) 5 MG KIT Inject 3.3 Units into the skin daily. 1 kit 11  . vitamin E 400 UNIT capsule Take 400 Units by mouth daily.     [name] Reasons 20 MG TABS tablet TAKE 1 TABLET (20 MG TOTAL) BY MOUTH DAILY WITH SUPPER. 90 tablet 2  . ADULT TPN 1,800 mLs 4 (four) times a week. Pt receives home TPN from [facility]:  1800 mL bag, four nights weekly (Monday, Tuesday, Wednesday, Thursday for 8 hours (includes 1 hour taper up and down).    . ciprofloxacin (CIPRO) 500 MG tablet Take by mouth at bedtime. As needed for bacterial overgrowth (Patient not taking: Reported on 05/03/2020)    . denosumab (PROLIA) 60 MG/ML SOSY injection Inject 60 mg into the skin every 6 (six) months. (Patient not taking: No sig reported)    . loperamide (IMODIUM A-D) 2 MG tablet Take by mouth as needed. (Patient not taking: Reported on 05/03/2020)     No facility-administered medications prior to visit.     Social History   Socioeconomic History  . Marital status: Married    Spouse name: Not on file  . Number of children: Not on file  . Years of education: Not on file  . Highest education level: Not on file  Occupational History  . Not on file  Tobacco Use  . Smoking status: Former Smoker    Types: Cigarettes    Quit date: 05/12/2004    Years since quitting: 15.9  . Smokeless tobacco: Never Used  Vaping Use  . Vaping Use: Never used  Substance and Sexual Activity  . Alcohol use: No  . Drug use: No  . Sexual activity: Yes    Comment: 1st intercourse 37 yo-5 partners  Other Topics Concern  . 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 Lack of Transportation (Medical): No  . Lack of Transportation (Non-Medical): No  Physical Activity: Not on file  Stress: Not on file  Social Connections: Not on file  Intimate Partner Violence: Not on file      Review of Systems   other ros negative  Objective:    Wt 153 lb 9.6 oz (69.7 kg)   LMP 02/26/2014  BMI 23.35 kg/m  Nursing note and vital signs reviewed.  Physical Exam    well appearing Normocephalic, conj clear, eomi cv rrr no mrg Ext bilateral R>L brawny edema Lungs clear normal resp effort abd s/nt Skin no rash   Labs: Reviewed Wbc 5   Micro: bcx negative ucx negative  Serology:  Imaging:  Assessment & Plan:   Problem List Items Addressed This Visit   None   Visit Diagnoses    Presence of permanent central venous catheter    -  Primary     Concern for recurrent sepsis however this at least at this point doesn't seem to be the case   bcx negative. No other sign of sepsis a week ago. Doing well since ed visit  -stop bactrim -patient knows well of sepsis sign to return to ED     I am having Elijah C. Pesqueira maintain her diphenhydrAMINE, multivitamin with minerals, ADULT TPN, Teduglutide (rDNA), vitamin E, sodium chloride, PRESCRIPTION MEDICATION, acetaminophen, clobetasol ointment, cetirizine, fluticasone, ondansetron, omeprazole, Xarelto, denosumab, loperamide, ciprofloxacin, calcium citrate-vitamin D, Entresto, and sulfamethoxazole-trimethoprim.   No orders of the defined types were placed in this encounter.    Follow-up: No follow-ups on file.      Jabier Mutton, [facility] for Infectious Disease [place] -- -- pager   [phone] cell 05/03/2020, 2:02 PM

## 2020-05-07 ENCOUNTER — Other Ambulatory Visit (INDEPENDENT_AMBULATORY_CARE_PROVIDER_SITE_OTHER): Payer: Medicare Other

## 2020-05-07 DIAGNOSIS — K912 Postsurgical malabsorption, not elsewhere classified: Secondary | ICD-10-CM

## 2020-05-07 LAB — CBC
HCT: 31.1 % — ABNORMAL LOW (ref 36.0–46.0)
Hemoglobin: 10 g/dL — ABNORMAL LOW (ref 12.0–15.0)
MCHC: 32.1 g/dL (ref 30.0–36.0)
MCV: 98.8 fl (ref 78.0–100.0)
Platelets: 134 10*3/uL — ABNORMAL LOW (ref 150.0–400.0)
RBC: 3.14 Mil/uL — ABNORMAL LOW (ref 3.87–5.11)
RDW: 15.2 % (ref 11.5–15.5)
WBC: 4.7 10*3/uL (ref 4.0–10.5)

## 2020-05-07 LAB — COMPREHENSIVE METABOLIC PANEL
ALT: 21 U/L (ref 0–35)
AST: 22 U/L (ref 0–37)
Albumin: 3.1 g/dL — ABNORMAL LOW (ref 3.5–5.2)
Alkaline Phosphatase: 99 U/L (ref 39–117)
BUN: 9 mg/dL (ref 6–23)
CO2: 16 mEq/L — ABNORMAL LOW (ref 19–32)
Calcium: 8.2 mg/dL — ABNORMAL LOW (ref 8.4–10.5)
Chloride: 114 mEq/L — ABNORMAL HIGH (ref 96–112)
Creatinine, Ser: 1.15 mg/dL (ref 0.40–1.20)
GFR: 48.44 mL/min — ABNORMAL LOW (ref >60.00)
Glucose, Bld: 72 mg/dL (ref 70–99)
Potassium: 3.8 mEq/L (ref 3.5–5.1)
Sodium: 137 mEq/L (ref 135–145)
Total Bilirubin: 0.4 mg/dL (ref 0.2–1.2)
Total Protein: 7.1 g/dL (ref 6.0–8.3)

## 2020-05-07 LAB — MAGNESIUM: Magnesium: 2.1 mg/dL (ref 1.5–2.5)

## 2020-05-07 LAB — PHOSPHORUS: Phosphorus: 4.2 mg/dL (ref 2.3–4.6)

## 2020-05-07 LAB — C-REACTIVE PROTEIN: CRP: <1 mg/dL (ref 0.5–20.0)

## 2020-05-07 LAB — VITAMIN B12: Vitamin B-12: 357 pg/mL (ref 211–911)

## 2020-05-08 NOTE — Progress Notes (Signed)
Chronic Care Management Pharmacy Assistant   Name: [name] [name]  MRN: [id number] DOB: 1950/08/20  Reason for Encounter: Patient Assistance Documentation  PCP : [name], MD  Allergies:   Allergies  Allergen Reactions  . Penicillins Itching, Swelling and Other (See Comments)    Reaction:  Facial swelling Has patient had a PCN reaction causing immediate rash, facial/tongue/throat swelling, SOB or lightheadedness with hypotension: Yes Has patient had a PCN reaction causing severe rash involving mucus membranes or skin necrosis: No Has patient had a PCN reaction that required hospitalization No Has patient had a PCN reaction occurring within the last 10 years: No If all of the above answers are "NO", then may proceed with Cephalosporin use.    Medications: Outpatient Encounter Medications as of 04/17/2020  Medication Sig Note  . acetaminophen (TYLENOL) 500 MG tablet Take 1,000 mg by mouth daily as needed for mild pain.   . ADULT TPN 1,800 mLs 4 (four) times a week. Pt receives home TPN from [facility]:  1800 mL bag, four nights weekly (Monday, Tuesday, Wednesday, Thursday for 8 hours (includes 1 hour taper up and down).   . cetirizine (ZYRTEC) 10 MG tablet Take 1 tablet (10 mg total) by mouth daily.   . clobetasol ointment (TEMOVATE) 3.42 % Apply 1 application topically 2 (two) times daily.   Marland Kitchen denosumab (PROLIA) 60 MG/ML SOSY injection Inject 60 mg into the skin every 6 (six) months. (Patient not taking: No sig reported) 04/16/2020: Working to get through patient assistance  . diphenhydrAMINE (BENADRYL) 25 mg capsule Take 25-50 mg by mouth every 6 (six) hours as needed for itching or sleep.    . fluticasone (FLONASE) 50 MCG/ACT nasal spray Place 2 sprays into both nostrils daily.   . Multiple Vitamin (MULTIVITAMIN WITH MINERALS) TABS Take 1 tablet by mouth daily.   Marland Kitchen omeprazole (PRILOSEC) 40 MG capsule TAKE 1 CAPSULE 2 (TWO) TIMES DAILY BY MOUTH. TAKE BEFORE BREAKFAST AND  SUPPER   . ondansetron (ZOFRAN-ODT) 8 MG disintegrating tablet TAKE 1 TABLET (8 MG TOTAL) BY MOUTH EVERY 8 (EIGHT) HOURS AS NEEDED FOR NAUSEA OR VOMITING.   Marland Kitchen PRESCRIPTION MEDICATION 5 mLs See admin instructions. 50 units Heparin sodium flush   . sodium chloride 0.9 % infusion Uses for TPN   . Teduglutide, rDNA, (GATTEX) 5 MG KIT Inject 3.3 Units into the skin daily.   . vitamin E 400 UNIT capsule Take 400 Units by mouth daily.    [name] Reasons 20 MG TABS tablet TAKE 1 TABLET (20 MG TOTAL) BY MOUTH DAILY WITH SUPPER.   . [DISCONTINUED] sacubitril-valsartan (ENTRESTO) 24-26 MG Take 1 tablet by mouth 2 (two) times daily.    No facility-administered encounter medications on file as of 04/17/2020.    Current Diagnosis: Patient Active Problem List   Diagnosis Date Noted  . 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 Hypotension 12/30/2017  . Bradycardia 12/30/2017  . 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 Degenerative joint disease 07/08/2016  . PICC line infection 05/16/2015  . 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 Splenomegaly   . Hx of adenomatous colonic polyps 04/18/2012  . Chronic systolic heart failure ([place]) 12/25/2011  . Osteoporosis 03/14/2010  . Anemia of chronic disease 10/01/2009  . GERD (gastroesophageal reflux disease) 11/19/2008  . ALKALINE PHOSPHATASE, ELEVATED 10/12/2008  . VITAMIN D DEFICIENCY 01/19/2008  . TRANSAMINASES, SERUM, ELEVATED 01/19/2008  . Bacterial overgrowth syndrome 11/19/2004  . Acquired short  bowel syndrome 11/20/2003    Spoke with patients husband and informed patients husband that I will be mailing out patient assistance applications for Prolia, Xarelto and entresto. Informed patients husband if patient could just complete filling out application and return to office once completed.  [name] [name] ,[facility] Pharmacist Assistant [phone]  Follow-Up:  Pharmacist Review

## 2020-05-09 ENCOUNTER — Ambulatory Visit (INDEPENDENT_AMBULATORY_CARE_PROVIDER_SITE_OTHER): Payer: Medicare Other | Admitting: Pharmacist

## 2020-05-09 ENCOUNTER — Other Ambulatory Visit: Payer: Self-pay

## 2020-05-09 VITALS — BP 100/58 | HR 68

## 2020-05-09 DIAGNOSIS — I6523 Occlusion and stenosis of bilateral carotid arteries: Secondary | ICD-10-CM | POA: Diagnosis not present

## 2020-05-09 DIAGNOSIS — I5022 Chronic systolic (congestive) heart failure: Secondary | ICD-10-CM

## 2020-05-09 MED ORDER — FUROSEMIDE 20 MG PO TABS: ORAL_TABLET | ORAL | 3 refills | Status: DC

## 2020-05-09 MED ORDER — ENTRESTO 24-26 MG PO TABS: 1.0000 | ORAL_TABLET | Freq: Two times a day (BID) | ORAL | 11 refills | Status: DC

## 2020-05-09 NOTE — Progress Notes (Signed)
0.Patient ID: [name] [name]                 DOB: 1950/05/21                      MRN: [id number]     HPI: [name] [name] is a 69 y.o. female referred by Dr. Johnsie Cancel for CHF management. 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   Had echo on 03/14/20 and LVEF estimated at 20-25%.  Patient placed on low dose Entresto and plans for right and left cath.  Has referral to CHF clinic in February.  Patient has short bowel syndrome treated with Gattex and TPN.  Has central line and receives TPN overnight on Monday, Tuesday, Wednesday, and Thursday. IS off TPN on Fri/Sat/Sun. Also supplements with multivitamins and calcium.  Receives monthly lab draws typically on the last Monday of the month.  Sometimes gets dehydrated on non-TPN days and has to give fluids.  Most recent labs on 05/07/20.  K 3.8, Scr 1.15, GFR 48.44, Ca 8.2  Patient presents to day with her husband. Reports no shortness of breath, chest pain or headaches. She has LLE top of her feet and ankles. This is new. Her weight which is normally 145-146lb M-Thr (TPN days), is up to 149-150lb this week.  Walks dog with husband most days. No SOB with exertion, sleeps on 1 pillow.   She has not been checking her blood pressure at home. Has little bit of dizziness, but it is improved from when she first started [place]. Blood pressure today in clinic was 83/46 in the right arm and 100/58 in the left. HR 68.      Despite TPN, patient says she has an excellent appetite and eats most anything.  Says she likes comfort foods.    Current HTN meds: Entresto 49/$RemoveBeforeDEI'50mg'JpGCUZZFRXmgXpxR$  BID Previously tried: carvedilol 3.125, isosorbide $RemoveBeforeDEI'10mg'fqHsGLqTXienHaEx$ , losartan 25  BP goal: <130/80  Family History: no significant cardiac  history patient knows of  Social History: quit smoking 15 years ago, no alcohol  Diet: salads, seafood, steak, bbq      Exercise: walks dog  Home BP readings:  Not checking  Wt Readings from Last 3 Encounters:  05/03/20 153 lb 9.6 oz (69.7 kg)  04/28/20 149 lb (67.6 kg)  04/26/20 150 lb 9.6 oz (68.3 kg)   BP Readings from Last 3 Encounters:  04/28/20 (!) 102/55  04/26/20 116/60  02/10/20 110/70   Pulse Readings from Last 3 Encounters:  04/28/20 (!) 55  04/26/20 (!) 52  02/10/20 63    Renal function: Estimated Creatinine Clearance: 45.9 mL/min (by C-G formula based on SCr of 1.15 mg/dL).  Past Medical History:  Diagnosis Date  . Abnormal LFTs 2013  . Allergic rhinosinusitis   . Anemia of chronic disease   . At risk for dental problems   . Atypical nevus   . Bacteremia due to Klebsiella pneumoniae 12/12/2011  . Bacterial overgrowth syndrome   . 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 Carotid stenosis    Carotid US (9/15):  R 40-59%; L 1-39% >> FU 1 year  . Closed left subtrochanteric femur fracture ([place]) 12/31/2017  . Congestive heart failure (CHF) ([place])    EF  25-30%  . 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 Fracture of left clavicle   . History of blood transfusion 2013   anemia  . Hx of cardiovascular stress test    Myoview (9/15):  inf-apical scar; no ischemia; EF 47% - low risk   . 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 Osteoporosis   . Pancytopenia 10/07/2011  . Pathologic fracture of neck of femur ([place])   . Personal history of colonic polyps   . Renal insufficiency    hx of yrs ago  . Serratia marcescens infection - bactermia assoc w/ PICC 01/18/2015  . 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 Splenomegaly    By ultrasound  . Thrombophilia ([place])   . Vitamin D deficiency     Current Outpatient Medications on File Prior to Visit   Medication Sig Dispense Refill  . acetaminophen (TYLENOL) 500 MG tablet Take 1,000 mg by mouth daily as needed for mild pain.    . ADULT TPN 1,800 mLs 4 (four) times a week. Pt receives home TPN from [facility]:  1800 mL bag, four nights weekly (Monday, Tuesday, Wednesday, Thursday for 8 hours (includes 1 hour taper up and down).    . calcium citrate-vitamin D (CITRACAL+D) 315-200 MG-UNIT tablet Take 1 tablet by mouth 2 (two) times daily.    . cetirizine (ZYRTEC) 10 MG tablet Take 1 tablet (10 mg total) by mouth daily. 30 tablet 11  . ciprofloxacin (CIPRO) 500 MG tablet Take by mouth at bedtime. As needed for bacterial overgrowth (Patient not taking: Reported on 05/03/2020)    . clobetasol ointment (TEMOVATE) 9.70 % Apply 1 application topically 2 (two) times daily. 30 g 0  . denosumab (PROLIA) 60 MG/ML SOSY injection Inject 60 mg into the skin every 6 (six) months. (Patient not taking: No sig reported)    . diphenhydrAMINE (BENADRYL) 25 mg capsule Take 25-50 mg by mouth every 6 (six) hours as needed for itching or sleep.     . fluticasone (FLONASE) 50 MCG/ACT nasal spray Place 2 sprays into both nostrils daily. 16 g 6  . loperamide (IMODIUM A-D) 2 MG tablet Take by mouth as needed. (Patient not taking: Reported on 05/03/2020)    . Multiple Vitamin (MULTIVITAMIN WITH MINERALS) TABS Take 1 tablet by mouth daily.    Marland Kitchen omeprazole (PRILOSEC) 40 MG capsule TAKE 1 CAPSULE 2 (TWO) TIMES DAILY BY MOUTH. TAKE BEFORE BREAKFAST AND SUPPER 180 capsule 1  . ondansetron (ZOFRAN-ODT) 8 MG disintegrating tablet TAKE 1 TABLET (8 MG TOTAL) BY MOUTH EVERY 8 (EIGHT) HOURS AS NEEDED FOR NAUSEA OR VOMITING. 30 tablet 11  . PRESCRIPTION MEDICATION 5 mLs See admin instructions. 50 units Heparin sodium flush    . sacubitril-valsartan (ENTRESTO) 49-51 MG Take 1 tablet by mouth 2 (two) times daily. 60 tablet 0  . sodium chloride 0.9 % infusion Uses for TPN    . sulfamethoxazole-trimethoprim (BACTRIM DS) 800-160 MG tablet  Take 1 tablet by mouth 2 (two) times daily for 14 days. 28 tablet 0  . Teduglutide, rDNA, (GATTEX) 5 MG KIT Inject 3.3 Units into the skin daily. 1 kit 11  . vitamin E 400 UNIT capsule Take 400 Units by mouth daily.     [name] Reasons 20 MG TABS tablet TAKE 1 TABLET (20 MG TOTAL) BY MOUTH DAILY WITH SUPPER. 90 tablet 2   No current facility-administered medications on file prior to visit.  Allergies  Allergen Reactions  . Penicillins Itching, Swelling and Other (See Comments)    Reaction:  Facial swelling Has patient had a PCN reaction causing immediate rash, facial/tongue/throat swelling, SOB or lightheadedness with hypotension: Yes Has patient had a PCN reaction causing severe rash involving mucus membranes or skin necrosis: No Has patient had a PCN reaction that required hospitalization No Has patient had a PCN reaction occurring within the last 10 years: No If all of the above answers are "NO", then may proceed with Cephalosporin use.     Assessment/Plan:  1. CHF - Patient's blood pressure in room today is a little too low in my opinion, especially if we are going to need to use furosemide for swelling. I have asked patient to skip her evening dose of Entresto tonight. Starting tomorrow she will reduce her dose to [facility] 24/$RemoveBeforeDE'26mg'EySfpwKLearHSHG$  twice a day. She will take a dose of furosemide $RemoveBefor'20mg'xqmkIlWVFLGn$  tomorrow AM. If swelling is not improved she may take an additional dose on Friday. Concerned about her taking furosemide on days she is not on TPN- concerned about causing dehydration. I will call her Monday to follow up. Will schedule follow up in office at that time. HR today in office was 68, but was 52 last visit. I have asked patient to check her blood pressure and HR at home so we can have a better understanding on her HR and blood pressure to decide if we can add a beta blocker or spironolactone.  Thank you,  Ramond Dial, Pharm.D, BCPS, CPP [place]  [address],  [place], [place] [address]  Phone: [phone]; Fax: [phone]  05/09/2020 7:38 AM

## 2020-05-09 NOTE — Patient Instructions (Addendum)
It was nice to meet you!  Skip your Entresto dose tonight.  Please decrease your Entresto to 24/26mg  twice a day. You may cut your 49/51mg  tablets in half until you use them up.  Take furosemide 20mg  tablet once tomorrow AM.   Please start checking your blood pressure and heart rate at home and bring your readings with you to your next appointment.  Call me at [phone] with any questions  I will call you Monday to follow up on how you are doing.  Hypertension "High blood pressure"  Hypertension is often called "The Silent Killer." It rarely causes symptoms until it is extremely  high or has done damage to other organs in the body. For this reason, you should have your  blood pressure checked regularly by your physician. We will check your blood pressure  every time you see a provider at one of our offices.   Your blood pressure reading consists of two numbers. Ideally, blood pressure should be  below 120/80. The first ("top") number is called the systolic pressure. It measures the  pressure in your arteries as your heart beats. The second ("bottom") number is called the diastolic pressure. It measures the pressure in your arteries as the heart relaxes between beats.  The benefits of getting your blood pressure under control are enormous. A 10-point  reduction in systolic blood pressure can reduce your risk of stroke by 27% and heart failure by 28%  Your blood pressure goal is <130/80  To check your pressure at home you will need to:  1. Sit up in a chair, with feet flat on the floor and back supported. Do not cross your ankles or legs. 2. Rest your left arm so that the cuff is about heart level. If the cuff goes on your upper arm,  then just relax the arm on the table, arm of the chair or your lap. If you have a wrist cuff, we  suggest relaxing your wrist against your chest (think of it as Pledging the Flag with the  wrong arm).  3. Place the cuff snugly around your arm,  about 1 inch above the crook of your elbow. The  cords should be inside the groove of your elbow.  4. Sit quietly, with the cuff in place, for about 5 minutes. After that 5 minutes press the power  button to start a reading. 5. Do not talk or move while the reading is taking place.  6. Record your readings on a sheet of paper. Although most cuffs have a memory, it is often  easier to see a pattern developing when the numbers are all in front of you.  7. You can repeat the reading after 1-3 minutes if it is recommended  Make sure your bladder is empty and you have not had caffeine or tobacco within the last 30 min  Always bring your blood pressure log with you to your appointments. If you have not brought your monitor in to be double checked for accuracy, please bring it to your next appointment.  You can find a list of validated (accurate) blood pressure cuffs at PopPath.it  Healthy Diet  SALT  What is the big deal with sodium? Why the need to limit our intake? What is the connection to  blood pressure? And what is the difference between salt and sodium? Sodium attracts water. Think about it. When you eat an overly salty snack, you tend to become  thirsty and need more water. If you have  too much sodium in your bloodstream, your body will  then pull water into the bloodstream as well, trying to correct the imbalance. When you have  more volume in the bloodstream, your blood pressure goes up. Your heart must work harder to  pump the extra volume, and the increase in pressure can wear out blood vessels faster. You may  also notice bloating and weight gain. Hypertension is one of the leading risk factors for heart  disease. By limiting sodium intake throughout life you are helping decrease your risk of heart  disease later on.   Salt is made up of two minerals. Sodium and chloride. A teaspoon of salt contains about 40%  sodium and 60% chloride. One teaspoon of salt has 2,300 mg of  sodium. While the current  USDA guideline states you should consume no more than 2,300 mg per day, both they and the  American Heart Association recommend that you limit this to 1,500 mg to stay healthy. Sea salt  or Himalayan pink salt may have a slightly different taste, but they still have almost the same  percent of sodium per teaspoon. So feel free to use them instead of table salt, but don't use  more.  A common myth is that if you don't add salt to your food, you are following a low sodium diet.  However, 75% of the sodium consumed in the American diet is from processed foods, NOT the  salt shaker. We all know that chips and crackers are high in sodium, but there are many other  foods that we may not think of when limiting our sodium. Below are the "salty six" foods that  the American Heart Association wants you to be aware of. 1. Cold cuts - even the healthy sliced [place] can have over 1,000 mg of sodium per slice.   Compare different brands to see which has less sodium if you eat these regularly 2. Pizza - depending on your toppings, a slice of pizza can have up to 760 mg of   sodium. Put more veggies on it or just have a slice with a side salad and still enjoy. 3. Soup - yes even that old home remedy of chicken soup is loaded with sodium. Look   for low sodium versions. Or add a bunch of frozen veggies when heating it, this will   give you less sodium per serving 4. Breads - they may not taste salty, but a single slice of bread can have up to 230 mg of   sodium. Toast for breakfast, a sandwich at lunch and a dinner roll can quickly add up   to over 900 mg in just one day.  5. Chicken - some fresh or frozen chicken is injected with a sodium solution before it   reaches the store. A 4 oz serving should have no more than 100 mg sodium. And   watch for breaded frozen chicken nuggets, strips and tenders. They may seem like a   quick and easy "healthy" meal, but they have high amounts of  sodium as well. 6. Burritos/tacos - just 2 teaspoons of taco seasoning can have over 400 mg sodium.   Try making your own with equal parts of cumin, oregano, chili powder and garlic powder.   SUGAR  Sugar is a huge problem in the modern day diet. Sugar is a HUGE contributor to heart disease, diabetes, high triglyceride levels, fatty liver diease and obesity. Sugar is hidden in almost all packaged foods/beverages. It  adds no nutritional benefit to your body and can cause major harm. Added sugar is extra sugar that is added beyond what is naturally found. The American Heart Association recommends limiting added sugars to no more than 25g for women and 36 grams for men per day.  There are many names for sugar maltose, sucrose (names ending in "ose"), high fructose corn syrup, molasses, cane sugar, corn sweetener, raw sugar, syrup, honey or fruit juice concentrate.   One of the best ways to limit your added sugars is to stop drinking sweetened beverages such as soda, sweet tea, fruit juice or fancy coffee's. There is 65g of added sugars in one 20oz bottle of Coke!! That is equal to 6 donuts.   Pay attention and read all nutrition facts labels. Below is an examples of a nutrition facts label. The #1 is showing you the total sugars where the # 2 is showing you the added sugars. This one severing has almost the max amount of added sugars per day!  Watch out for items that say "low fat" or "no added sugar" as these products are typically very high in sugar. The food industry uses these terms to fool you into thinking they are healthy.  For more information on the dangers of sugar watch WHY Sugar is as Bad as Alcohol (Fructose, The Liver Toxin) on YouTube.    EXERCISE  Exercise can help lower your blood pressure ~5 points systolic (top #) and 8 points diastolic (bottom #)  Exercise is good. We've all heard that. In an ideal world, we would all have time and resources to  get plenty of it. When you  are active your heart pumps more efficiently and you will feel better.  Multiple studies show that even walking regularly has benefits that include living a longer life.  The American Heart Association recommends 90-150 minutes per week of exercise (30 minutes  per day most days of the week). You can do this in any increment you wish. Nine or more  10-minute walks count. So does an hour-long exercise class. Break the time apart into what will  work in your life. Some of the best things you can do include walking briskly, jogging, cycling or  swimming laps. Not everyone is ready to "exercise." Sometimes we need to start with just getting active. Here  are some easy ways to be more active throughout the day: Marland Kitchen Take the stairs instead of the elevator . Go for a 10-15 minute walk during your lunch break (find a friend to make it more enjoyable) . When shopping, park at the back of the parking lot . If you take public transportation, get off one stop early and walk the extra distance . Pace around while making phone calls (most of [place] are not attached to phone cords any longer!) Check with your doctor if you aren't sure what your limitations may be. Always remember to drink plenty of water when doing any type of exercise. Don't feel like a failure if you're not getting the 90-150 minutes per week. If you started by being  a couch potato, then just a 10-minute walk each day is a huge improvement. Start with little  victories and work your way up.   Healthy Eating Tips  When looking to improve your eating habits, whether to lose weight, lower blood pressure or just be healthier, it helps to know what a serving size is.   Grains 1 slice of bread,  bagel,  cup pasta or  rice  Vegetables 1 cup fresh or raw vegetables,  cup cooked or canned Fruits 1 piece of medium sized fruit,  cup canned,   Meats/Proteins  cup dried       1 oz meat, 1 egg,  cup cooked beans, nuts or  seeds  Dairy        Fats Individual yogurt container, 1 cup (8oz)    1 teaspoon margarine/butter or vegetable  milk or milk alternative, 1 slice of cheese          oil; 1 tablespoon mayonnaise or salad dressing                  Plan ahead: make a menu of the meals for a week then create a grocery list to go with  that menu. Consider meals that easily stretch into a night of leftovers, such as stews or  casseroles. Or consider making two of your favorite meal and put one in the freezer for  another night. Try a night or two each week that is "meatless" or "no cook" such as salads.  When you get home from the grocery store wash and prepare your vegetables and fruits.  Then when you need them they are ready to go.  Tips for going to the grocery store: . Buy store or generic brands . Check the weekly ad from your store on-line or in their in-store flyer . Look at the unit price on the shelf tag to compare/contrast the costs of different items . Buy fruits/vegetables in season . Carrots, bananas and apples are low-cost, naturally healthy items . If meats or frozen vegetables are on sale, buy some extras and put in your freezer . Limit buying prepared or "ready to eat" items, even if they are pre-made salads or fruit snacks . Do not shop when you're hungry . Foods at eye level tend to be more expensive. Look on the high and low shelves for deals. . Consider shopping at the farmer's market for fresh foods in season. . Choose canned tuna or salmon instead of fresh . Avoid the cookie and chip aisles (these are expensive, high in calories and low in  nutritional value) Healthy food preparations: . If you can't get lean hamburger, be sure to drain the fat when cooking . Steam, saut (in olive oil), grill or bake foods . Experiment with different seasonings to avoid adding salt to your foods. Kosher salt, sea salt and [place] salt are all still salt and should be avoided          Resources: American Heart Association - InstantFinish.fi Go to the Healthy Living tab to get more information American Diabetes Association - www.diabetes.org You don't have to be diabetic - check out the Food and Fitness tab  DASH diet - https://wilson-eaton.com/ Health topics - or just search on their home page for DASH Quit for Life - www.cancer.org Follow the Stay Healthy tab to learn more about smoking cessatio

## 2020-05-12 DIAGNOSIS — U071 COVID-19: Secondary | ICD-10-CM

## 2020-05-12 HISTORY — DX: COVID-19: U07.1

## 2020-05-14 ENCOUNTER — Telehealth: Payer: Self-pay | Admitting: Pharmacist

## 2020-05-14 NOTE — Telephone Encounter (Signed)
Called patient to follow up on how her swelling and blood pressure are doing after a few doses of furosemide and decreased Entresto dose. Called and left VM for patient to call back

## 2020-05-14 NOTE — Telephone Encounter (Signed)
Patient called back and left VM. Stating landlines were down to call [phone]. Called this number but no answer and unable to leave message

## 2020-05-14 NOTE — Telephone Encounter (Signed)
Patient called back. She took one dose of furosemide. Said swelling is doing good. Swelling is about back to baseline. She has just finished her non-TPN days. Is going to give some hydration as she feels a little dry. Her blood pressure and heart-rate has been as follows:  109/52 118/63 109/53 118/62 108/63  HR  63 70 66 56 53 53 56 59  Will see in clinic in 2 weeks

## 2020-05-17 ENCOUNTER — Other Ambulatory Visit: Payer: Self-pay | Admitting: Internal Medicine

## 2020-05-21 ENCOUNTER — Other Ambulatory Visit: Payer: Self-pay | Admitting: Cardiovascular Disease

## 2020-05-21 DIAGNOSIS — I5022 Chronic systolic (congestive) heart failure: Secondary | ICD-10-CM

## 2020-05-21 LAB — FATTY ACID PANEL, ESSENTIAL (C12-C22), SERUM
Arachidic, C20:0: 13.07 umol/L — ABNORMAL LOW (ref 16.8–38.5)
Arachidonic, C20:4w6: 489.1 umol/L (ref 351.4–1057)
DHA, C22:6w3: 66.87 umol/L (ref 53.3–216.3)
DPA, C22:5w3: 51.92 umol/L (ref 19.9–77.6)
DPA, C22:5w6: 15.12 umol/L (ref 9.2–32.1)
DTA, C22:4w6: 26.16 umol/L (ref 9.1–44.6)
Docosenoic, C22:1: 6.04 umol/L (ref 5.73–11.92)
EPA, C20:5w3: 31.09 umol/L (ref 12.4–123)
Hexadecenoic, C16:1w9: 155.63 umol/L — ABNORMAL HIGH (ref 19.82–59.9)
LAURIC,C12:0: 16.76 umol/L (ref 4.3–36)
Linoleic, C18:2w6: 2992.96 umol/L (ref 2653.4–613)
Mead, C20:3w9: 16.58 umol/L (ref 10.3–41.3)
Myristic, C14:0: 186.42 umol/L (ref 39.4–258.2)
Nervonic, C24:1w9: 87.82 umol/L (ref 56.9–132.7)
Oleic, C18:1w9: 4984.22 umol/L — ABNORMAL HIGH (ref 872.4–4182)
Palmitic, C16:0: 3425.72 umol/L (ref 1671.1–460)
Palmitoleic, C16:1w7: 748.61 umol/L — ABNORMAL HIGH (ref 68.5–570.2)
Stearic, C18:0: 988.11 umol/L (ref 590.2–1377)
Total Fatty Acids: 15.12 umol/L (ref 7.93–18.34)
Total Monounsaturated: 6.43 umol/L — ABNORMAL HIGH (ref 1.45–4.95)
Total Polyunsaturated: 3.99 umol/L (ref 3.57–8.11)
Total Saturated: 4.69 umol/L (ref 2.5–6.4)
Total w3: 0.24 umol/L (ref 0.1–0.5)
Total w6: 3.72 umol/L (ref 3.3–7.1)
Triene Tetraene Ratio: 0.03 (ref 0.02–0.05)
Vaccenic, C18:1w7: 422.55 umol/L — ABNORMAL HIGH (ref 84.8–260.8)
a Linolenic, C18:3w3: 89.07 umol/L (ref 26.1–150.1)
g Linolenic, C18:3w6: 69.29 umol/L (ref 14.5–144.9)
h g-Linolenic, C20:3w6: 127.19 umol/L (ref 70–319.9)

## 2020-05-21 LAB — PREALBUMIN: Prealbumin: 15 mg/dL — ABNORMAL LOW (ref 17–34)

## 2020-05-22 ENCOUNTER — Other Ambulatory Visit: Payer: Self-pay | Admitting: *Deleted

## 2020-05-22 DIAGNOSIS — I5022 Chronic systolic (congestive) heart failure: Secondary | ICD-10-CM

## 2020-05-25 ENCOUNTER — Telehealth: Payer: Self-pay

## 2020-05-25 NOTE — Chronic Care Management (AMB) (Signed)
Chronic Care Management Pharmacy Assistant   Name: Quinta C Haigh  MRN: [id number] DOB: March 26, 1951  Reason for Encounter: Disease State/General Adherence Call   PCP : [name], MD  Allergies:   Allergies  Allergen Reactions  . Penicillins Itching, Swelling and Other (See Comments)    Reaction:  Facial swelling Has patient had a PCN reaction causing immediate rash, facial/tongue/throat swelling, SOB or lightheadedness with hypotension: Yes Has patient had a PCN reaction causing severe rash involving mucus membranes or skin necrosis: No Has patient had a PCN reaction that required hospitalization No Has patient had a PCN reaction occurring within the last 10 years: No If all of the above answers are "NO", then may proceed with Cephalosporin use.    Medications: Outpatient Encounter Medications as of 05/25/2020  Medication Sig Note  . acetaminophen (TYLENOL) 500 MG tablet Take 1,000 mg by mouth daily as needed for mild pain.   . ADULT TPN 1,800 mLs 4 (four) times a week. Pt receives home TPN from [facility]:  1800 mL bag, four nights weekly (Monday, Tuesday, Wednesday, Thursday for 8 hours (includes 1 hour taper up and down).   . calcium citrate-vitamin D (CITRACAL+D) 315-200 MG-UNIT tablet Take 1 tablet by mouth 2 (two) times daily.   . cetirizine (ZYRTEC) 10 MG tablet Take 1 tablet (10 mg total) by mouth daily.   . ciprofloxacin (CIPRO) 500 MG tablet Take by mouth at bedtime. As needed for bacterial overgrowth (Patient not taking: Reported on 05/03/2020)   . clobetasol ointment (TEMOVATE) 4.27 % Apply 1 application topically 2 (two) times daily.   Marland Kitchen denosumab (PROLIA) 60 MG/ML SOSY injection Inject 60 mg into the skin every 6 (six) months. (Patient not taking: No sig reported) 04/16/2020: Working to get through patient assistance  . diphenhydrAMINE (BENADRYL) 25 mg capsule Take 25-50 mg by mouth every 6 (six) hours as needed for itching or sleep.    . fluticasone (FLONASE)  50 MCG/ACT nasal spray Place 2 sprays into both nostrils daily.   . furosemide (LASIX) 20 MG tablet Take one tablet by mouth as needed for swelling   . loperamide (IMODIUM A-D) 2 MG tablet Take by mouth as needed. (Patient not taking: Reported on 05/03/2020)   . Multiple Vitamin (MULTIVITAMIN WITH MINERALS) TABS Take 1 tablet by mouth daily.   Marland Kitchen omeprazole (PRILOSEC) 40 MG capsule TAKE 1 CAPSULE 2 (TWO) TIMES DAILY BY MOUTH. TAKE BEFORE BREAKFAST AND SUPPER   . ondansetron (ZOFRAN-ODT) 8 MG disintegrating tablet TAKE 1 TABLET (8 MG TOTAL) BY MOUTH EVERY 8 (EIGHT) HOURS AS NEEDED FOR NAUSEA OR VOMITING.   Marland Kitchen PRESCRIPTION MEDICATION 5 mLs See admin instructions. 50 units Heparin sodium flush   . sacubitril-valsartan (ENTRESTO) 24-26 MG Take 1 tablet by mouth 2 (two) times daily.   . sodium chloride 0.9 % infusion Uses for TPN   . Teduglutide, rDNA, (GATTEX) 5 MG KIT Inject 3.3 Units into the skin daily.   . vitamin E 400 UNIT capsule Take 400 Units by mouth daily.    [name] Reasons 20 MG TABS tablet TAKE 1 TABLET (20 MG TOTAL) BY MOUTH DAILY WITH SUPPER.    No facility-administered encounter medications on file as of 05/25/2020.    Current Diagnosis: Patient Active Problem List   Diagnosis Date Noted  . 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 Hypotension 12/30/2017  . Bradycardia 12/30/2017  . 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 Degenerative  joint disease 07/08/2016  . PICC line infection 05/16/2015  . 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 Splenomegaly   . Hx of adenomatous colonic polyps 04/18/2012  . Chronic systolic heart failure ([place]) 12/25/2011  . Osteoporosis 03/14/2010  . Anemia of chronic disease 10/01/2009  . GERD (gastroesophageal reflux disease) 11/19/2008  . ALKALINE PHOSPHATASE, ELEVATED 10/12/2008  . VITAMIN D DEFICIENCY 01/19/2008  . TRANSAMINASES, SERUM, ELEVATED 01/19/2008  . Bacterial overgrowth  syndrome 11/19/2004  . Acquired short bowel syndrome 11/20/2003    Have you had any problems recently with your health?  Patient states she recently had an echocardiogram with no so good results. Patient states she is a little concerned about her results showing chronic systolic heart failure.  Have you had any problems with your pharmacy?  Patient states she has not had any problems with her pharmacy.  What issues or side effects are you having with your medications?  Patient states her medication Entresto causes her blood pressure to drop some and also causes her to have swelling. Therefore patient states her medication dose was lowered and seems to be doing better.   Patient currently has patient assistance applications for Prolia, Xarelto and Entresto, that she still needs to complete and drop off to the office.  What would you like me to pass along to Madelin Rear, [facility] for them to help you with?   Patient states she can not think of anything that needs to be passed along to Madelin Rear, [facility] at this time.  What can we do to take care of you better?  Patient states we are currently taking great care of her. Patient states she is just a little frustrated due to her heart condition.   Ajwa D Calhoun, [place] Pharmacist Assistant [phone]  Follow-Up:  Pharmacist Review

## 2020-05-29 ENCOUNTER — Ambulatory Visit: Payer: Medicare Other

## 2020-05-31 ENCOUNTER — Telehealth (INDEPENDENT_AMBULATORY_CARE_PROVIDER_SITE_OTHER): Payer: Medicare Other | Admitting: Pharmacist

## 2020-05-31 ENCOUNTER — Ambulatory Visit: Payer: Medicare Other

## 2020-05-31 ENCOUNTER — Other Ambulatory Visit: Payer: Self-pay | Admitting: Family Medicine

## 2020-05-31 ENCOUNTER — Other Ambulatory Visit: Payer: Self-pay

## 2020-05-31 DIAGNOSIS — I5022 Chronic systolic (congestive) heart failure: Secondary | ICD-10-CM

## 2020-05-31 NOTE — Progress Notes (Deleted)
Patient ID: [name] [name]                 DOB: [birth year]                      MRN: [id number]     HPI: [name] [name] is a 70 y.o. female referred by Dr. Johnsie Cancel for CHF management. 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   Had echo on 03/14/20 and LVEF estimated at 20-25%.  Patient placed on low dose Entresto and plans for right and left cath.  Has referral to CHF clinic in February.  Patient has short bowel syndrome treated with Gattex and TPN.  Has central line and receives TPN overnight on Monday, Tuesday, Wednesday, and Thursday. IS off TPN on Fri/Sat/Sun. Also supplements with multivitamins and calcium.  Receives monthly lab draws typically on the last Monday of the month.  Sometimes gets dehydrated on non-TPN days and has to give fluids.  Most recent labs on 05/07/20.  K 3.8, Scr 1.15, GFR 48.44, Ca 8.2  At last apt on 05/09/20 Entresto was decreased back to 24/$RemoveBe'26mg'ZLLGNJWeG$  twice a day due to low blood pressures. She was given furosemide $RemoveBeforeDE'20mg'vnxBJJkZOiGCWvP$  for swelling and instructed to take 2 doses. On follow up on 1/3 patients swelling was much better. Blood pressures were 108/63-118/63. HR 53-70.   Home BP/HR Needed any furosemide? Dizziness, lightheadedness, headache, blurred vision, SOB, swelling Could we do spironolactone? Would need new labs   Current HTN meds: Entresto 24/$RemoveBeforeDEI'26mg'NMcwHiQexSRvuNVx$  BID, furosemide $RemoveBeforeD'20mg'VvIKIgwCcVgwss$  prn Previously tried: carvedilol 3.125, isosorbide $RemoveBeforeDEI'10mg'CAtxnhrFLpypnprk$ , losartan 25  BP goal: <130/80  Family History: no significant cardiac history patient knows of  Social History: quit smoking 15 years ago, no alcohol  Diet: salads, seafood, steak, bbq      Exercise: walks dog  Home BP readings:  Not checking  Wt Readings from Last 3  Encounters:  05/03/20 153 lb 9.6 oz (69.7 kg)  04/28/20 149 lb (67.6 kg)  04/26/20 150 lb 9.6 oz (68.3 kg)   BP Readings from Last 3 Encounters:  05/09/20 (!) 100/58  04/28/20 (!) 102/55  04/26/20 116/60   Pulse Readings from Last 3 Encounters:  05/09/20 68  04/28/20 (!) 55  04/26/20 (!) 52    Renal function: CrCl cannot be calculated (Patient's most recent lab result is older than the maximum 21 days allowed.).  Past Medical History:  Diagnosis Date  . Abnormal LFTs 2013  . Allergic rhinosinusitis   . Anemia of chronic disease   . At risk for dental problems   . Atypical nevus   . Bacteremia due to Klebsiella pneumoniae 12/12/2011  . Bacterial overgrowth syndrome   . 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 Carotid stenosis    Carotid US (9/15):  R 40-59%; L 1-39% >> FU 1 year  . Closed left subtrochanteric femur fracture ([place]) 12/31/2017  . Congestive heart failure (CHF) (HCC)    EF 25-30%  . 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 Fracture of left clavicle   . History of blood transfusion 2013   anemia  . Hx of cardiovascular stress test    Myoview (9/15):  inf-apical scar; no ischemia; EF 47% - low risk   .  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 Osteoporosis   . Pancytopenia 10/07/2011  . Pathologic fracture of neck of femur ([place])   . Personal history of colonic polyps   . Renal insufficiency    hx of yrs ago  . Serratia marcescens infection - bactermia assoc w/ PICC 01/18/2015  . 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 Splenomegaly    By ultrasound  . Thrombophilia ([place])   . Vitamin D deficiency     Current Outpatient Medications on File Prior to Visit  Medication Sig Dispense Refill  . acetaminophen (TYLENOL) 500 MG tablet Take 1,000 mg by mouth daily as needed for mild pain.    . ADULT TPN 1,800 mLs 4 (four) times a week. Pt receives home  TPN from [facility]:  1800 mL bag, four nights weekly (Monday, Tuesday, Wednesday, Thursday for 8 hours (includes 1 hour taper up and down).    . calcium citrate-vitamin D (CITRACAL+D) 315-200 MG-UNIT tablet Take 1 tablet by mouth 2 (two) times daily.    . cetirizine (ZYRTEC) 10 MG tablet Take 1 tablet (10 mg total) by mouth daily. 30 tablet 11  . ciprofloxacin (CIPRO) 500 MG tablet Take by mouth at bedtime. As needed for bacterial overgrowth (Patient not taking: Reported on 05/03/2020)    . clobetasol ointment (TEMOVATE) 6.28 % Apply 1 application topically 2 (two) times daily. 30 g 0  . denosumab (PROLIA) 60 MG/ML SOSY injection Inject 60 mg into the skin every 6 (six) months. (Patient not taking: No sig reported)    . diphenhydrAMINE (BENADRYL) 25 mg capsule Take 25-50 mg by mouth every 6 (six) hours as needed for itching or sleep.     . furosemide (LASIX) 20 MG tablet Take one tablet by mouth as needed for swelling 30 tablet 3  . loperamide (IMODIUM A-D) 2 MG tablet Take by mouth as needed. (Patient not taking: Reported on 05/03/2020)    . Multiple Vitamin (MULTIVITAMIN WITH MINERALS) TABS Take 1 tablet by mouth daily.    Marland Kitchen omeprazole (PRILOSEC) 40 MG capsule TAKE 1 CAPSULE 2 (TWO) TIMES DAILY BY MOUTH. TAKE BEFORE BREAKFAST AND SUPPER 180 capsule 1  . ondansetron (ZOFRAN-ODT) 8 MG disintegrating tablet TAKE 1 TABLET (8 MG TOTAL) BY MOUTH EVERY 8 (EIGHT) HOURS AS NEEDED FOR NAUSEA OR VOMITING. 30 tablet 11  . PRESCRIPTION MEDICATION 5 mLs See admin instructions. 50 units Heparin sodium flush    . sacubitril-valsartan (ENTRESTO) 24-26 MG Take 1 tablet by mouth 2 (two) times daily. 60 tablet 11  . sodium chloride 0.9 % infusion Uses for TPN    . Teduglutide, rDNA, (GATTEX) 5 MG KIT Inject 3.3 Units into the skin daily. 1 kit 11  . vitamin E 400 UNIT capsule Take 400 Units by mouth daily.     [name] Reasons 20 MG TABS tablet TAKE 1 TABLET (20 MG TOTAL) BY MOUTH DAILY WITH SUPPER. 90 tablet 2   No  current facility-administered medications on file prior to visit.    Allergies  Allergen Reactions  . Penicillins Itching, Swelling and Other (See Comments)    Reaction:  Facial swelling Has patient had a PCN reaction causing immediate rash, facial/tongue/throat swelling, SOB or lightheadedness with hypotension: Yes Has patient had a PCN reaction causing severe rash involving mucus membranes or skin necrosis: No Has patient had a PCN reaction that required hospitalization No Has patient had a PCN reaction occurring within the last  10 years: No If all of the above answers are "NO", then may proceed with Cephalosporin use.     Assessment/Plan:  1. CHF - Patient's blood pressure in room today is a little too low in my opinion, especially if we are going to need to use furosemide for swelling. I have asked patient to skip her evening dose of Entresto tonight. Starting tomorrow she will reduce her dose to [facility] 24/$RemoveBeforeDEI'26mg'dDIEWwIfYcPgcQcZ$  twice a day. She will take a dose of furosemide $RemoveBefor'20mg'ijqMLlLzIumh$  tomorrow AM. If swelling is not improved she may take an additional dose on Friday. Concerned about her taking furosemide on days she is not on TPN- concerned about causing dehydration. I will call her Monday to follow up. Will schedule follow up in office at that time. HR today in office was 68, but was 52 last visit. I have asked patient to check her blood pressure and HR at home so we can have a better understanding on her HR and blood pressure to decide if we can add a beta blocker or spironolactone.  Thank you,  Ramond Dial, Pharm.D, BCPS, CPP [place]  [address], [place], [place] [address]  Phone: [phone]; Fax: [phone]  05/31/2020 9:18 AM

## 2020-05-31 NOTE — Progress Notes (Signed)
Patient ID: [name]                 DOB: 04/15/1951                      MRN: [id number]     HPI: [name] [name] is a 70 y.o. female referred by Dr. Johnsie Cancel for CHF management. 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   Had echo on 03/14/20 and LVEF estimated at 20-25%.  Patient placed on low dose Entresto and plans for right and left cath.  Has referral to CHF clinic in February.  Patient has short bowel syndrome treated with Gattex and TPN.  Has central line and receives TPN overnight on Monday, Tuesday, Wednesday, and Thursday. IS off TPN on Fri/Sat/Sun. Also supplements with multivitamins and calcium.  Receives monthly lab draws typically on the last Monday of the month.  Sometimes gets dehydrated on non-TPN days and has to give fluids.  Most recent labs on 05/07/20.  K 3.8, Scr 1.15, GFR 48.44, Ca 8.2  At last apt on 05/09/20 [name] [name] was decreased back to 24/48m twice a day due to low blood pressures. She was given furosemide 240mfor swelling and instructed to take 2 doses. On follow up on 1/3 patients swelling was much better. Blood pressures were 108/63-118/63. HR 53-70.   Today's visit is done via telephone due to weather. Patient states her blood pressure has been good. Denies any dizziness, lightheadedness, headache, blurred vision or SOB. Has had a little bit of swelling in ankles and feet. Took lasix twice with good response. Getting monthly labs done on 1/31. Concerned about adding more medications. Has not actually talked to a doctor about her diagnosis. Is to see Dr. BeHaroldine Lawsn 2/5.   Current HTN meds: Entresto 24/2629mID, furosemide 60m10mn Previously tried: carvedilol 3.125, isosorbide 10mg19msartan 25   BP goal: <130/80  Family History: no significant cardiac history patient knows of  Social History: quit smoking 15 years ago, no alcohol  Diet: salads, seafood, steak, bbq      Exercise: walks dog  Home BP readings:  117/53 HR 58 120/64 (before meds) 122/64, 121/57, 117/52 HR 54, 120/64 (before meds) 100/52 HR 51, 107/53 HR 61, 127/78 HR 56, 123/65 HR 60, 121/62, 101/62, 100/58 (lowest)  Wt Readings from Last 3 Encounters:  05/03/20 153 lb 9.6 oz (69.7 kg)  04/28/20 149 lb (67.6 kg)  04/26/20 150 lb 9.6 oz (68.3 kg)   BP Readings from Last 3 Encounters:  05/09/20 (!) 100/58  04/28/20 (!) 102/55  04/26/20 116/60   Pulse Readings from Last 3 Encounters:  05/09/20 68  04/28/20 (!) 55  04/26/20 (!) 52    Renal function: CrCl cannot be calculated (Patient's most recent lab result is older than the maximum 21 days allowed.).  Past Medical History:  Diagnosis Date  . Abnormal LFTs 2013  . Allergic rhinosinusitis   . Anemia of chronic disease   . At risk for dental problems   . Atypical nevus   . Bacteremia due to Klebsiella pneumoniae 12/12/2011  . Bacterial overgrowth syndrome   . Brachial vein thrombus, left (HCC) [place]0/2014  . Carnitine deficiency (HCC) [place]4/2020  . Carotid stenosis    Carotid US ([place]):  R 40-59%; L 1-39% >> FU 1 year  . Closed left subtrochanteric femur fracture ([place]) 12/31/2017  . Congestive heart failure (CHF) (HCC)    EF 25-30%  . 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 Fracture of left clavicle   . History of blood transfusion 2013   anemia  . Hx of cardiovascular stress test    Myoview (9/15):  inf-apical scar; no ischemia; EF 47% - low risk   . 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 Osteoporosis   . Pancytopenia 10/07/2011  . Pathologic fracture of neck of femur ([place])   . Personal history of colonic polyps   . Renal insufficiency    hx of yrs ago  . Serratia marcescens infection - bactermia assoc w/ PICC 01/18/2015   . 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 Splenomegaly    By ultrasound  . Thrombophilia ([place])   . Vitamin D deficiency     Current Outpatient Medications on File Prior to Visit  Medication Sig Dispense Refill  . acetaminophen (TYLENOL) 500 MG tablet Take 1,000 mg by mouth daily as needed for mild pain.    . ADULT TPN 1,800 mLs 4 (four) times a week. Pt receives home TPN from [facility]:  1800 mL bag, four nights weekly (Monday, Tuesday, Wednesday, Thursday for 8 hours (includes 1 hour taper up and down).    . calcium citrate-vitamin D (CITRACAL+D) 315-200 MG-UNIT tablet Take 1 tablet by mouth 2 (two) times daily.    . cetirizine (ZYRTEC) 10 MG tablet Take 1 tablet (10 mg total) by mouth daily. 30 tablet 11  . ciprofloxacin (CIPRO) 500 MG tablet Take by mouth at bedtime. As needed for bacterial overgrowth (Patient not taking: Reported on 05/03/2020)    . clobetasol ointment (TEMOVATE) 2.13 % Apply 1 application topically 2 (two) times daily. 30 g 0  . denosumab (PROLIA) 60 MG/ML SOSY injection Inject 60 mg into the skin every 6 (six) months. (Patient not taking: No sig reported)    . diphenhydrAMINE (BENADRYL) 25 mg capsule Take 25-50 mg by mouth every 6 (six) hours as needed for itching or sleep.     . furosemide (LASIX) 20 MG tablet Take one tablet by mouth as needed for swelling 30 tablet 3  . loperamide (IMODIUM A-D) 2 MG tablet Take by mouth as needed. (Patient not taking: Reported on 05/03/2020)    . Multiple Vitamin (MULTIVITAMIN WITH MINERALS) TABS Take 1 tablet by mouth daily.    Marland Kitchen omeprazole (PRILOSEC) 40 MG capsule TAKE 1 CAPSULE 2 (TWO) TIMES DAILY BY MOUTH. TAKE BEFORE BREAKFAST AND SUPPER 180 capsule 1  . ondansetron (ZOFRAN-ODT) 8 MG disintegrating tablet TAKE 1 TABLET (8 MG TOTAL) BY MOUTH EVERY 8 (EIGHT) HOURS AS NEEDED FOR NAUSEA OR VOMITING. 30 tablet 11  . PRESCRIPTION MEDICATION 5 mLs See admin instructions. 50 units Heparin  sodium flush    . sacubitril-valsartan (ENTRESTO) 24-26 MG Take 1 tablet by mouth 2 (two) times daily. 60 tablet 11  . sodium chloride 0.9 % infusion Uses for TPN    . Teduglutide, rDNA, (GATTEX) 5 MG KIT Inject 3.3 Units into the skin daily. 1 kit 11  . vitamin E 400 UNIT capsule Take 400 Units by mouth daily.     [name] Reasons 20 MG TABS tablet TAKE 1 TABLET (20 MG TOTAL) BY MOUTH DAILY WITH SUPPER. 90 tablet 2   No current facility-administered medications  on file prior to visit.    Allergies  Allergen Reactions  . Penicillins Itching, Swelling and Other (See Comments)    Reaction:  Facial swelling Has patient had a PCN reaction causing immediate rash, facial/tongue/throat swelling, SOB or lightheadedness with hypotension: Yes Has patient had a PCN reaction causing severe rash involving mucus membranes or skin necrosis: No Has patient had a PCN reaction that required hospitalization No Has patient had a PCN reaction occurring within the last 10 years: No If all of the above answers are "NO", then may proceed with Cephalosporin use.     Assessment/Plan:  1. CHF - Patients blood pressure looks much better on Entresto 24/62m twice a day. She has minimal swelling. Cannot use beta blocker due to low HR at baseline. I recommended adding spironolactone 12.530mdaily, however patient was hesitant to start for multiple reasons. She wanted to be seen by a doctor first and was concerned because when her husband first start spironolactone he had headaches. I told patient I understood her concerns, that it was ok to wait and see what Dr. BeHaroldine Lawsould like to do. Her case is a bit tricky as she does not absorb meds well, they usually need to be crushed and she is on TPN 4 days a week and sometimes needs hydration on the days she if off of TNP. Advised patient to call [place] any concerns. Weight is stable and pt has no SOB. Will let [facility] handle case, unless they request we  assist.   Thank you,  MeRamond DialPharm.D, BCPS, CPP [id number]. [id number] [id number]: [phone]: [phone]/20/2022 9:18 AM

## 2020-06-11 ENCOUNTER — Other Ambulatory Visit (INDEPENDENT_AMBULATORY_CARE_PROVIDER_SITE_OTHER): Payer: Medicare Other

## 2020-06-11 DIAGNOSIS — K912 Postsurgical malabsorption, not elsewhere classified: Secondary | ICD-10-CM

## 2020-06-11 LAB — PHOSPHORUS: Phosphorus: 3.4 mg/dL (ref 2.3–4.6)

## 2020-06-11 LAB — COMPREHENSIVE METABOLIC PANEL
ALT: 13 U/L (ref 0–35)
AST: 12 U/L (ref 0–37)
Albumin: 3 g/dL — ABNORMAL LOW (ref 3.5–5.2)
Alkaline Phosphatase: 106 U/L (ref 39–117)
BUN: 18 mg/dL (ref 6–23)
CO2: 20 mEq/L (ref 19–32)
Calcium: 8.3 mg/dL — ABNORMAL LOW (ref 8.4–10.5)
Chloride: 113 mEq/L — ABNORMAL HIGH (ref 96–112)
Creatinine, Ser: 1.18 mg/dL (ref 0.40–1.20)
GFR: 46.93 mL/min — ABNORMAL LOW (ref >60.00)
Glucose, Bld: 96 mg/dL (ref 70–99)
Potassium: 3.4 mEq/L — ABNORMAL LOW (ref 3.5–5.1)
Sodium: 139 mEq/L (ref 135–145)
Total Bilirubin: 0.4 mg/dL (ref 0.2–1.2)
Total Protein: 6.9 g/dL (ref 6.0–8.3)

## 2020-06-11 LAB — C-REACTIVE PROTEIN: CRP: 1.9 mg/dL (ref 0.5–20.0)

## 2020-06-11 LAB — CBC
HCT: 29.1 % — ABNORMAL LOW (ref 36.0–46.0)
Hemoglobin: 9.6 g/dL — ABNORMAL LOW (ref 12.0–15.0)
MCHC: 33.1 g/dL (ref 30.0–36.0)
MCV: 96.2 fl (ref 78.0–100.0)
Platelets: 122 10*3/uL — ABNORMAL LOW (ref 150.0–400.0)
RBC: 3.02 Mil/uL — ABNORMAL LOW (ref 3.87–5.11)
RDW: 14.3 % (ref 11.5–15.5)
WBC: 6.7 10*3/uL (ref 4.0–10.5)

## 2020-06-11 LAB — VITAMIN B12: Vitamin B-12: 464 pg/mL (ref 211–911)

## 2020-06-11 LAB — MAGNESIUM: Magnesium: 1.8 mg/dL (ref 1.5–2.5)

## 2020-06-15 ENCOUNTER — Encounter (HOSPITAL_COMMUNITY): Payer: Self-pay | Admitting: Internal Medicine

## 2020-06-15 ENCOUNTER — Ambulatory Visit (HOSPITAL_COMMUNITY)
Admission: RE | Admit: 2020-06-15 | Discharge: 2020-06-15 | Disposition: A | Discharge status: Home / Self Care | Payer: Medicare Other | Source: Ambulatory Visit | Attending: Internal Medicine | Admitting: Internal Medicine

## 2020-06-15 ENCOUNTER — Other Ambulatory Visit: Payer: Self-pay

## 2020-06-15 ENCOUNTER — Other Ambulatory Visit (HOSPITAL_COMMUNITY): Payer: Self-pay

## 2020-06-15 VITALS — BP 130/80 | HR 55 | Wt 145.4 lb

## 2020-06-15 DIAGNOSIS — Z79899 Other long term (current) drug therapy: Secondary | ICD-10-CM | POA: Diagnosis not present

## 2020-06-15 DIAGNOSIS — M7989 Other specified soft tissue disorders: Secondary | ICD-10-CM | POA: Insufficient documentation

## 2020-06-15 DIAGNOSIS — K912 Postsurgical malabsorption, not elsewhere classified: Secondary | ICD-10-CM | POA: Insufficient documentation

## 2020-06-15 DIAGNOSIS — Z7901 Long term (current) use of anticoagulants: Secondary | ICD-10-CM | POA: Diagnosis not present

## 2020-06-15 DIAGNOSIS — I350 Nonrheumatic aortic (valve) stenosis: Secondary | ICD-10-CM | POA: Diagnosis not present

## 2020-06-15 DIAGNOSIS — Z87891 Personal history of nicotine dependence: Secondary | ICD-10-CM | POA: Insufficient documentation

## 2020-06-15 DIAGNOSIS — I2510 Atherosclerotic heart disease of native coronary artery without angina pectoris: Secondary | ICD-10-CM | POA: Diagnosis not present

## 2020-06-15 DIAGNOSIS — I429 Cardiomyopathy, unspecified: Secondary | ICD-10-CM | POA: Diagnosis not present

## 2020-06-15 DIAGNOSIS — I5022 Chronic systolic (congestive) heart failure: Secondary | ICD-10-CM | POA: Insufficient documentation

## 2020-06-15 DIAGNOSIS — Z880 Allergy status to penicillin: Secondary | ICD-10-CM | POA: Insufficient documentation

## 2020-06-15 DIAGNOSIS — Z8249 Family history of ischemic heart disease and other diseases of the circulatory system: Secondary | ICD-10-CM | POA: Insufficient documentation

## 2020-06-15 DIAGNOSIS — I440 Atrioventricular block, first degree: Secondary | ICD-10-CM | POA: Insufficient documentation

## 2020-06-15 DIAGNOSIS — I447 Left bundle-branch block, unspecified: Secondary | ICD-10-CM | POA: Insufficient documentation

## 2020-06-15 DIAGNOSIS — I255 Ischemic cardiomyopathy: Secondary | ICD-10-CM | POA: Diagnosis not present

## 2020-06-15 LAB — BASIC METABOLIC PANEL
Anion gap: 8 (ref 5–15)
BUN: 12 mg/dL (ref 8–23)
CO2: 18 mmol/L — ABNORMAL LOW (ref 22–32)
Calcium: 8.4 mg/dL — ABNORMAL LOW (ref 8.9–10.3)
Chloride: 111 mmol/L (ref 98–111)
Creatinine, Ser: 0.97 mg/dL (ref 0.44–1.00)
GFR, Estimated: >60 mL/min (ref >60)
Glucose, Bld: 86 mg/dL (ref 70–99)
Potassium: 3.8 mmol/L (ref 3.5–5.1)
Sodium: 137 mmol/L (ref 135–145)

## 2020-06-15 LAB — CBC
HCT: 32 % — ABNORMAL LOW (ref 36.0–46.0)
Hemoglobin: 9.9 g/dL — ABNORMAL LOW (ref 12.0–15.0)
MCH: 30.9 pg (ref 26.0–34.0)
MCHC: 30.9 g/dL (ref 30.0–36.0)
MCV: 100 fL (ref 80.0–100.0)
Platelets: 127 10*3/uL — ABNORMAL LOW (ref 150–400)
RBC: 3.2 MIL/uL — ABNORMAL LOW (ref 3.87–5.11)
RDW: 14.6 % (ref 11.5–15.5)
WBC: 4.7 10*3/uL (ref 4.0–10.5)
nRBC: 0 % (ref 0.0–0.2)

## 2020-06-15 MED ORDER — SPIRONOLACTONE 25 MG PO TABS: 12.5000 mg | ORAL_TABLET | Freq: Every day | ORAL | 3 refills | Status: DC

## 2020-06-15 NOTE — Progress Notes (Signed)
ADVANCED HF CLINIC CONSULT NOTE  Referring Physician: Dr. Johnsie Cancel Primary Care: No primary care provider on file. Primary Cardiologist: Dr. Johnsie Cancel  HPI:  [name] [name] is a 70 y.o. 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 Johnsie Cancel for further evalaution of her progressive cardiomyopathy.   Echo 2015 found to have EF 45% with inferior wall and apical HK - she did not want to have heart cath. Myoview done at that time showed inferior/ apical scar with no ischemia She was not on beta blocker due to bradycardia and first degree block at baseline.   Carotid duplex 07/19/15 40-59% RICA stenosis repeat 01/13/20 plaque no stenosis with new lab criteria Echo 12/03/16 EF 40-45% AV sclerosis mean gradient 6 mmHg  She saw Dr. Johnsie Cancel recently in 10/21 and refused cath again. Echo performed 11/21 to reassess EF and EF 20-25% mild AS mean gradient 10. 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 Deferred any venography intervention  Here with her daughter, [name] [name]. Remains on TPN 4 nights per week. Says she does whatever she wants to do. No CP or SOB. Does have some mild LE edema. No orthopnea or PND. Can go up and down stairs. Tolerates Entresto ok.     Review of Systems: [y] = yes, [ ]  = no   General: Weight gain [ ] ; Weight loss [ ] ; Anorexia [ ] ; Fatigue [ ] ; Fever [ ] ; Chills [ ] ; Weakness [ ]   Cardiac: Chest pain/pressure [ ] ; Resting SOB [ ] ; Exertional SOB [ ] ; Orthopnea [ ] ; Pedal Edema [ y]; Palpitations [ ] ; Syncope [ ] ; Presyncope [ ] ; Paroxysmal nocturnal dyspnea[ ]   Pulmonary: Cough [ ] ; Wheezing[ ] ; Hemoptysis[ ] ; Sputum [ ] ; Snoring [ ]   GI: Vomiting[ ] ; Dysphagia[ ] ; Melena[ ] ;  Hematochezia [ ] ; Heartburn[ ] ; Abdominal pain [ ] ; Constipation [ ] ; Diarrhea [ ] ; BRBPR [ ]   GU: Hematuria[ ] ; Dysuria [ ] ; Nocturia[ ]   Vascular: Pain in legs with walking [ ] ; Pain in feet with lying flat [ ] ; Non-healing sores [ ] ; Stroke [ ] ; TIA [ ] ; Slurred speech [ ] ;  Neuro: Headaches[ ] ; Vertigo[ ] ; Seizures[ ] ; Paresthesias[ ] ;Blurred vision [ ] ; Diplopia [ ] ; Vision changes [ ]   Ortho/Skin: Arthritis [ y]; Joint pain [ y]; Muscle pain [ ] ; Joint swelling [ ] ; Back Pain [ ] ; Rash [ ]   Psych: Depression[ ] ; Anxiety[y ]  Heme: Bleeding problems [ ] ; Clotting disorders [ ] ; Anemia Blue.Reese ]  Endocrine: Diabetes [ ] ; Thyroid dysfunction[ ]    Past Medical History:  Diagnosis Date  . Abnormal LFTs 2013  . Allergic rhinosinusitis   . Anemia of chronic disease   . At risk for dental problems   . Atypical nevus   . Bacteremia due to Klebsiella pneumoniae 12/12/2011  . Bacterial overgrowth syndrome   . 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 Carotid stenosis    Carotid US (9/15):  R 40-59%; L 1-39% >> FU 1 year  . Closed left subtrochanteric femur fracture ([place]) 12/31/2017  . Congestive heart failure (CHF) (HCC)    EF 25-30%  . 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  07/31/2017  . Fracture of left clavicle   . History of blood transfusion 2013   anemia  . Hx of cardiovascular stress test    Myoview (9/15):  inf-apical scar; no ischemia; EF 47% - low risk   . 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 Osteoporosis   . Pancytopenia 10/07/2011  . Pathologic fracture of neck of femur ([place])   . Personal history of colonic polyps   . Renal insufficiency    hx of yrs ago  . Serratia marcescens infection - bactermia assoc w/ PICC 01/18/2015  . 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 Splenomegaly    By ultrasound  . Thrombophilia ([place])   . Vitamin D deficiency     Current Outpatient  Medications  Medication Sig Dispense Refill  . acetaminophen (TYLENOL) 500 MG tablet Take 1,000 mg by mouth daily as needed for mild pain.    . ADULT TPN 1,800 mLs 4 (four) times a week. Pt receives home TPN from [facility]:  1800 mL bag, four nights weekly (Monday, Tuesday, Wednesday, Thursday for 8 hours (includes 1 hour taper up and down).    . calcium citrate-vitamin D (CITRACAL+D) 315-200 MG-UNIT tablet Take 1 tablet by mouth 2 (two) times daily.    . cetirizine (ZYRTEC) 10 MG tablet Take 1 tablet (10 mg total) by mouth daily. 30 tablet 11  . ciprofloxacin (CIPRO) 500 MG tablet Take by mouth at bedtime. As needed for bacterial overgrowth (Patient not taking: Reported on 05/03/2020)    . clobetasol ointment (TEMOVATE) 4.33 % Apply 1 application topically 2 (two) times daily. 30 g 0  . denosumab (PROLIA) 60 MG/ML SOSY injection Inject 60 mg into the skin every 6 (six) months. (Patient not taking: No sig reported)    . diphenhydrAMINE (BENADRYL) 25 mg capsule Take 25-50 mg by mouth every 6 (six) hours as needed for itching or sleep.     . fluticasone (FLONASE) 50 MCG/ACT nasal spray SPRAY 2 SPRAYS INTO EACH NOSTRIL EVERY DAY 48 mL 2  . furosemide (LASIX) 20 MG tablet Take one tablet by mouth as needed for swelling 30 tablet 3  . loperamide (IMODIUM A-D) 2 MG tablet Take by mouth as needed. (Patient not taking: Reported on 05/03/2020)    . Multiple Vitamin (MULTIVITAMIN WITH MINERALS) TABS Take 1 tablet by mouth daily.    Marland Kitchen omeprazole (PRILOSEC) 40 MG capsule TAKE 1 CAPSULE 2 (TWO) TIMES DAILY BY MOUTH. TAKE BEFORE BREAKFAST AND SUPPER 180 capsule 1  . ondansetron (ZOFRAN-ODT) 8 MG disintegrating tablet TAKE 1 TABLET (8 MG TOTAL) BY MOUTH EVERY 8 (EIGHT) HOURS AS NEEDED FOR NAUSEA OR VOMITING. 30 tablet 11  . PRESCRIPTION MEDICATION 5 mLs See admin instructions. 50 units Heparin sodium flush    . sacubitril-valsartan (ENTRESTO) 24-26 MG Take 1 tablet by mouth 2 (two) times daily. 60 tablet 11  .  sodium chloride 0.9 % infusion Uses for TPN    . Teduglutide, rDNA, (GATTEX) 5 MG KIT Inject 3.3 Units into the skin daily. 1 kit 11  . vitamin E 400 UNIT capsule Take 400 Units by mouth daily.     [name] Reasons 20 MG TABS tablet TAKE 1 TABLET (20 MG TOTAL) BY MOUTH DAILY WITH SUPPER. 90 tablet 2   No current facility-administered medications for this encounter.    Allergies  Allergen Reactions  . Penicillins Itching, Swelling and Other (See Comments)    Reaction:  Facial swelling Has patient had a PCN reaction causing immediate rash, facial/tongue/throat swelling, SOB or lightheadedness with hypotension: Yes Has patient had a PCN reaction causing severe rash involving mucus membranes or skin necrosis: No Has patient had a PCN reaction that required hospitalization No Has patient had a PCN reaction occurring within the last 10 years: No If all of the above answers are "NO", then may proceed with Cephalosporin use.      Social History   Socioeconomic History  . Marital status: Married    Spouse name: Not on file  . Number of children: Not on file  . Years of education: Not on file  . Highest education level: Not on file  Occupational History  . Not on file  Tobacco Use  . Smoking status: Former Smoker    Types: Cigarettes    Quit date: 05/12/2004    Years since quitting: 16.1  . Smokeless tobacco: Never Used  Vaping Use  . Vaping Use: Never used  Substance and Sexual Activity  . Alcohol use: No  . Drug use: No  . Sexual activity: Yes    Comment: 1st intercourse 40 yo-5 partners  Other Topics Concern  . Not on file  Social History Narrative   Married to Molly Maduro, has 1 daughter and a granddaughter the patient's son died in childhood due to a motor vehicle wreck   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   No alcohol tobacco or drug use   04/07/2017   Social Determinants of Health   Financial Resource Strain: Not on file  Food  Insecurity: Not on file  Transportation Needs: No Transportation Needs  . Lack of Transportation (Medical): No  . Lack of Transportation (Non-Medical): No  Physical Activity: Not on file  Stress: Not on file  Social Connections: Not on file  Intimate Partner Violence: Not on file      Family History  Problem Relation Age of Onset  . Diabetes Mother   . Hypertension Mother   . AAA (abdominal aortic aneurysm) Mother   . Colon cancer Neg Hx   . Stomach cancer Neg Hx     Vitals:   06/15/20 1128  BP: 130/80  Pulse: (!) 55  SpO2: 100%  Weight: 66 kg (145 lb 6.4 oz)    PHYSICAL EXAM: General:  Well appearing. No respiratory difficulty HEENT: normal Neck: supple.JVP 10 Carotids 2+ bilat; no bruits. No lymphadenopathy or thryomegaly appreciated. Cor: PMI nondisplaced. Huston Foley. Regular. Soft SEM RUSB. s2 ok. Prominent venous collateral system on chest. + tunneled PICC Lungs: clear Abdomen: soft, nontender, nondistended. No hepatosplenomegaly. No bruits or masses. Good bowel sounds. Extremities: no cyanosis, clubbing, rash, 1+ edema Neuro: alert & oriented x 3, cranial nerves grossly intact. moves all 4 extremities w/o difficulty. Affect pleasant.  ECG:  Sinus 63 1AVB LBBB 154 ms Personally reviewed  ASSESSMENT & PLAN:  1. Cardiomyopathy  - EF 45% echo 2015 RWMA inferior wall and apex she did not want to have heart cath.  - Myoview 2015  EF 47% inferior/ apical scar with no ischemia - Echo 12/03/16 reviewed EF 40-45% AV sclerosis mean gradient 6 mmHg - Echo 11/21 EF 20-25% mild AS mean gradient 10  - NYHA I - Volume status mildly elevated but hard to assess with venous occlusions - Continue Entresto 24/26 - Not on b-blocker in bast due to bradycardia - Start spiro 12.5  - Consider SGLT2i  - I suspect she may have underlying  CAD with previous inferior infarct (based on previous studies) but in looking at her echo I think the main factor in her cardiomyopathy is her LBBB. I  suspect she would benefit from CRT but this is complicated by diffuse venous occlusions and need for ongoing TPN (high infection risk). Will need to discuss with EP colleagues. Epicardial LV lead a possibility but would only be part of the system - Plan coronary angiography to define anatomy. If RHC needed will need to go femorally  2. Hypercoaguable state - chronically occluded RIJ - h/o mesenteric infarct  - on Xarelto  3. 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 Has limited venous access  4. LBBB (15ms) - as above  5. Aortic stenosis - mild by echo   Total time spent 45 minutes. Over half that time spent discussing above.   Glori Bickers, MD  11:21 PM

## 2020-06-15 NOTE — H&P (View-Only) (Signed)
ADVANCED HF CLINIC CONSULT NOTE  Referring Physician: Dr. Johnsie Cancel Primary Care: No primary care provider on file. Primary Cardiologist: Dr. Johnsie Cancel  HPI:  [name] [name] is a 70 y.o. 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 Johnsie Cancel for further evalaution of her progressive cardiomyopathy.   Echo 2015 found to have EF 45% with inferior wall and apical HK - she did not want to have heart cath. Myoview done at that time showed inferior/ apical scar with no ischemia She was not on beta blocker due to bradycardia and first degree block at baseline.   Carotid duplex 07/19/15 40-59% RICA stenosis repeat 01/13/20 plaque no stenosis with new lab criteria Echo 12/03/16 EF 40-45% AV sclerosis mean gradient 6 mmHg  She saw Dr. Johnsie Cancel recently in 10/21 and refused cath again. Echo performed 11/21 to reassess EF and EF 20-25% mild AS mean gradient 10. 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 Deferred any venography intervention  Here with her daughter, [name]. Remains on TPN 4 nights per week. Says she does whatever she wants to do. No CP or SOB. Does have some mild LE edema. No orthopnea or PND. Can go up and down stairs. Tolerates Entresto ok.     Review of Systems: [y] = yes, [ ]  = no   General: Weight gain [ ] ; Weight loss [ ] ; Anorexia [ ] ; Fatigue [ ] ; Fever [ ] ; Chills [ ] ; Weakness [ ]   Cardiac: Chest pain/pressure [ ] ; Resting SOB [ ] ; Exertional SOB [ ] ; Orthopnea [ ] ; Pedal Edema [ y]; Palpitations [ ] ; Syncope [ ] ; Presyncope [ ] ; Paroxysmal nocturnal dyspnea[ ]   Pulmonary: Cough [ ] ; Wheezing[ ] ; Hemoptysis[ ] ; Sputum [ ] ; Snoring [ ]   GI: Vomiting[ ] ; Dysphagia[ ] ; Melena[ ] ;  Hematochezia [ ] ; Heartburn[ ] ; Abdominal pain [ ] ; Constipation [ ] ; Diarrhea [ ] ; BRBPR [ ]   GU: Hematuria[ ] ; Dysuria [ ] ; Nocturia[ ]   Vascular: Pain in legs with walking [ ] ; Pain in feet with lying flat [ ] ; Non-healing sores [ ] ; Stroke [ ] ; TIA [ ] ; Slurred speech [ ] ;  Neuro: Headaches[ ] ; Vertigo[ ] ; Seizures[ ] ; Paresthesias[ ] ;Blurred vision [ ] ; Diplopia [ ] ; Vision changes [ ]   Ortho/Skin: Arthritis [ y]; Joint pain [ y]; Muscle pain [ ] ; Joint swelling [ ] ; Back Pain [ ] ; Rash [ ]   Psych: Depression[ ] ; Anxiety[y ]  Heme: Bleeding problems [ ] ; Clotting disorders [ ] ; Anemia Blue.Reese ]  Endocrine: Diabetes [ ] ; Thyroid dysfunction[ ]    Past Medical History:  Diagnosis Date  . Abnormal LFTs 2013  . Allergic rhinosinusitis   . Anemia of chronic disease   . At risk for dental problems   . Atypical nevus   . Bacteremia due to Klebsiella pneumoniae 12/12/2011  . Bacterial overgrowth syndrome   . 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 Carotid stenosis    Carotid US (9/15):  R 40-59%; L 1-39% >> FU 1 year  . Closed left subtrochanteric femur fracture ([place]) 12/31/2017  . Congestive heart failure (CHF) (HCC)    EF 25-30%  . 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  07/31/2017  . Fracture of left clavicle   . History of blood transfusion 2013   anemia  . Hx of cardiovascular stress test    Myoview (9/15):  inf-apical scar; no ischemia; EF 47% - low risk   . 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 Osteoporosis   . Pancytopenia 10/07/2011  . Pathologic fracture of neck of femur ([place])   . Personal history of colonic polyps   . Renal insufficiency    hx of yrs ago  . Serratia marcescens infection - bactermia assoc w/ PICC 01/18/2015  . 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 Splenomegaly    By ultrasound  . Thrombophilia ([place])   . Vitamin D deficiency     Current Outpatient  Medications  Medication Sig Dispense Refill  . acetaminophen (TYLENOL) 500 MG tablet Take 1,000 mg by mouth daily as needed for mild pain.    . ADULT TPN 1,800 mLs 4 (four) times a week. Pt receives home TPN from [facility]:  1800 mL bag, four nights weekly (Monday, Tuesday, Wednesday, Thursday for 8 hours (includes 1 hour taper up and down).    . calcium citrate-vitamin D (CITRACAL+D) 315-200 MG-UNIT tablet Take 1 tablet by mouth 2 (two) times daily.    . cetirizine (ZYRTEC) 10 MG tablet Take 1 tablet (10 mg total) by mouth daily. 30 tablet 11  . ciprofloxacin (CIPRO) 500 MG tablet Take by mouth at bedtime. As needed for bacterial overgrowth (Patient not taking: Reported on 05/03/2020)    . clobetasol ointment (TEMOVATE) 1.66 % Apply 1 application topically 2 (two) times daily. 30 g 0  . denosumab (PROLIA) 60 MG/ML SOSY injection Inject 60 mg into the skin every 6 (six) months. (Patient not taking: No sig reported)    . diphenhydrAMINE (BENADRYL) 25 mg capsule Take 25-50 mg by mouth every 6 (six) hours as needed for itching or sleep.     . fluticasone (FLONASE) 50 MCG/ACT nasal spray SPRAY 2 SPRAYS INTO EACH NOSTRIL EVERY DAY 48 mL 2  . furosemide (LASIX) 20 MG tablet Take one tablet by mouth as needed for swelling 30 tablet 3  . loperamide (IMODIUM A-D) 2 MG tablet Take by mouth as needed. (Patient not taking: Reported on 05/03/2020)    . Multiple Vitamin (MULTIVITAMIN WITH MINERALS) TABS Take 1 tablet by mouth daily.    Marland Kitchen omeprazole (PRILOSEC) 40 MG capsule TAKE 1 CAPSULE 2 (TWO) TIMES DAILY BY MOUTH. TAKE BEFORE BREAKFAST AND SUPPER 180 capsule 1  . ondansetron (ZOFRAN-ODT) 8 MG disintegrating tablet TAKE 1 TABLET (8 MG TOTAL) BY MOUTH EVERY 8 (EIGHT) HOURS AS NEEDED FOR NAUSEA OR VOMITING. 30 tablet 11  . PRESCRIPTION MEDICATION 5 mLs See admin instructions. 50 units Heparin sodium flush    . sacubitril-valsartan (ENTRESTO) 24-26 MG Take 1 tablet by mouth 2 (two) times daily. 60 tablet 11  .  sodium chloride 0.9 % infusion Uses for TPN    . Teduglutide, rDNA, (GATTEX) 5 MG KIT Inject 3.3 Units into the skin daily. 1 kit 11  . vitamin E 400 UNIT capsule Take 400 Units by mouth daily.     [name] Reasons 20 MG TABS tablet TAKE 1 TABLET (20 MG TOTAL) BY MOUTH DAILY WITH SUPPER. 90 tablet 2   No current facility-administered medications for this encounter.    Allergies  Allergen Reactions  . Penicillins Itching, Swelling and Other (See Comments)    Reaction:  Facial swelling Has patient had a PCN reaction causing immediate rash, facial/tongue/throat swelling, SOB or lightheadedness with hypotension: Yes Has patient had a PCN reaction causing severe rash involving mucus membranes or skin necrosis: No Has patient had a PCN reaction that required hospitalization No Has patient had a PCN reaction occurring within the last 10 years: No If all of the above answers are "NO", then may proceed with Cephalosporin use.      Social History   Socioeconomic History  . Marital status: Married    Spouse name: Not on file  . Number of children: Not on file  . Years of education: Not on file  . Highest education level: Not on file  Occupational History  . Not on file  Tobacco Use  . Smoking status: Former Smoker    Types: Cigarettes    Quit date: 05/12/2004    Years since quitting: 16.1  . Smokeless tobacco: Never Used  Vaping Use  . Vaping Use: Never used  Substance and Sexual Activity  . Alcohol use: No  . Drug use: No  . Sexual activity: Yes    Comment: 1st intercourse 88 yo-5 partners  Other Topics Concern  . Not on file  Social History Narrative   Married to Herbie [place], has 1 daughter and a granddaughter the patient's son died in childhood due to a motor vehicle wreck   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   No alcohol tobacco or drug use   04/07/2017   Social Determinants of Health   Financial Resource Strain: Not on file  Food  Insecurity: Not on file  Transportation Needs: No Transportation Needs  . Lack of Transportation (Medical): No  . Lack of Transportation (Non-Medical): No  Physical Activity: Not on file  Stress: Not on file  Social Connections: Not on file  Intimate Partner Violence: Not on file      Family History  Problem Relation Age of Onset  . Diabetes Mother   . Hypertension Mother   . AAA (abdominal aortic aneurysm) Mother   . Colon cancer Neg Hx   . Stomach cancer Neg Hx     Vitals:   06/15/20 1128  BP: 130/80  Pulse: (!) 55  SpO2: 100%  Weight: 66 kg (145 lb 6.4 oz)    PHYSICAL EXAM: General:  Well appearing. No respiratory difficulty HEENT: normal Neck: supple.JVP 10 Carotids 2+ bilat; no bruits. No lymphadenopathy or thryomegaly appreciated. Cor: PMI nondisplaced. Loletha Grayer. Regular. Soft SEM RUSB. s2 ok. Prominent venous collateral system on chest. + tunneled PICC Lungs: clear Abdomen: soft, nontender, nondistended. No hepatosplenomegaly. No bruits or masses. Good bowel sounds. Extremities: no cyanosis, clubbing, rash, 1+ edema Neuro: alert & oriented x 3, cranial nerves grossly intact. moves all 4 extremities w/o difficulty. Affect pleasant.  ECG:  Sinus 63 1AVB 282ms LBBB 154 ms Personally reviewed  ASSESSMENT & PLAN:  1. Cardiomyopathy  - EF 45% echo 2015 RWMA inferior wall and apex she did not want to have heart cath.  - Myoview 2015  EF 47% inferior/ apical scar with no ischemia - Echo 12/03/16 reviewed EF 40-45% AV sclerosis mean gradient 6 mmHg - Echo 11/21 EF 20-25% mild AS mean gradient 10  - NYHA I - Volume status mildly elevated but hard to assess with venous occlusions - Continue Entresto 24/26 - Not on b-blocker in bast due to bradycardia - Start spiro 12.5  - Consider SGLT2i  - I suspect she may have underlying  CAD with previous inferior infarct (based on previous studies) but in looking at her echo I think the main factor in her cardiomyopathy is her LBBB. I  suspect she would benefit from CRT but this is complicated by diffuse venous occlusions and need for ongoing TPN (high infection risk). Will need to discuss with EP colleagues. Epicardial LV lead a possibility but would only be part of the system - Plan coronary angiography to define anatomy. If RHC needed will need to go femorally  2. Hypercoaguable state - chronically occluded RIJ - h/o mesenteric infarct  - on Xarelto  3. 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 Has limited venous access  4. LBBB (175ms) - as above  5. Aortic stenosis - mild by echo   Total time spent 45 minutes. Over half that time spent discussing above.   Glori Bickers, MD  11:21 PM

## 2020-06-15 NOTE — Patient Instructions (Signed)
START Spironolactone 12.5mg  (1/2 tablet) daily  Your physician recommends that you schedule a follow-up appointment in: 2 weeks with Pharmacy  Your physician recommends that you schedule a follow-up appointment in: 2 months with Dr. Haroldine Laws  If you have any questions or concerns before your next appointment please send [place] a message through [facility] or call our office at [phone].    TO LEAVE A MESSAGE FOR THE NURSE SELECT OPTION 2, PLEASE LEAVE A MESSAGE INCLUDING: . YOUR NAME . DATE OF BIRTH . CALL BACK NUMBER . REASON FOR CALL**this is important as we prioritize the call backs  YOU WILL RECEIVE A CALL BACK THE SAME DAY AS LONG AS YOU CALL BEFORE 4:00 PM     HEART CATHETERIZATION INSTRUCTIONS  You are scheduled for a Cardiac Catheterization on Tuesday, February 15 with Dr. Glori Bickers.  1. Please arrive at the [facility] (Main Entrance A) at [facility]: [address] [place], [place] [address] at 10:00 AM (This time is two hours before your procedure to ensure your preparation). Free valet parking service is available.   Special note: Every effort is made to have your procedure done on time. Please understand that emergencies sometimes delay scheduled procedures.  2. Diet: Do not eat solid foods after midnight.  The patient may have clear liquids until 5am upon the day of the procedure.  3. COVID TEST: Saturday 2/12 at 9:40am  4. Medication instructions in preparation for your procedure:  HOLD LASIX morning of procedure, HOLD Xarelto Night before and morning of procedure  On the morning of your procedure, take any morning medicines NOT listed above.  You may use sips of water.  5. Plan for one night stay--bring personal belongings. 6. Bring a current list of your medications and current insurance cards. 7. You MUST have a responsible person to drive you home. 8. Someone MUST be with you the first 24 hours after you arrive home or your discharge will be  delayed. 9. Please wear clothes that are easy to get on and off and wear slip-on shoes.  Thank you for allowing [place] to care for you!   -- [facility]

## 2020-06-19 LAB — FATTY ACID PANEL, ESSENTIAL (C12-C22), SERUM
Arachidic, C20:0: 15.51 umol/L — ABNORMAL LOW (ref 16.8–38.5)
Arachidonic, C20:4w6: 380.25 umol/L (ref 351.4–1057)
DHA, C22:6w3: 51.43 umol/L — ABNORMAL LOW (ref 53.3–216.3)
DPA, C22:5w3: 35.72 umol/L (ref 19.9–77.6)
DPA, C22:5w6: 12.38 umol/L (ref 9.2–32.1)
DTA, C22:4w6: 20.25 umol/L (ref 9.1–44.6)
Docosenoic, C22:1: 6.09 umol/L (ref 5.73–11.92)
EPA, C20:5w3: 21.3 umol/L (ref 12.4–123)
Hexadecenoic, C16:1w9: 62.28 umol/L — ABNORMAL HIGH (ref 19.82–59.9)
LAURIC,C12:0: 8.65 umol/L (ref 4.3–36)
Linoleic, C18:2w6: 2339.61 umol/L — ABNORMAL LOW (ref 2653.4–613)
Mead, C20:3w9: 15.51 umol/L (ref 10.3–41.3)
Myristic, C14:0: 80.75 umol/L (ref 39.4–258.2)
Nervonic, C24:1w9: 95.16 umol/L (ref 56.9–132.7)
Oleic, C18:1w9: 2148.08 umol/L (ref 872.4–4182)
Palmitic, C16:0: 2251.9 umol/L (ref 1671.1–460)
Palmitoleic, C16:1w7: 321.5 umol/L (ref 68.5–570.2)
Stearic, C18:0: 544.12 umol/L — ABNORMAL LOW (ref 590.2–1377)
Total Fatty Acids: 8.92 umol/L (ref 7.93–18.34)
Total Monounsaturated: 2.87 umol/L (ref 1.45–4.95)
Total Polyunsaturated: 3.09 umol/L — ABNORMAL LOW (ref 3.57–8.11)
Total Saturated: 2.96 umol/L (ref 2.5–6.4)
Total w3: 0.17 umol/L (ref 0.1–0.5)
Total w6: 2.9 umol/L — ABNORMAL LOW (ref 3.3–7.1)
Triene Tetraene Ratio: 0.04 (ref 0.02–0.05)
Vaccenic, C18:1w7: 216.53 umol/L (ref 84.8–260.8)
a Linolenic, C18:3w3: 58.37 umol/L (ref 26.1–150.1)
g Linolenic, C18:3w6: 44.89 umol/L (ref 14.5–144.9)
h g-Linolenic, C20:3w6: 98.6 umol/L (ref 70–319.9)

## 2020-06-19 LAB — PREALBUMIN: Prealbumin: 5 mg/dL — ABNORMAL LOW (ref 17–34)

## 2020-06-23 ENCOUNTER — Other Ambulatory Visit (HOSPITAL_COMMUNITY)
Admission: RE | Admit: 2020-06-23 | Discharge: 2020-06-23 | Disposition: A | Discharge status: Home / Self Care | Payer: Medicare Other | Source: Ambulatory Visit | Attending: Internal Medicine | Admitting: Internal Medicine

## 2020-06-23 DIAGNOSIS — Z01812 Encounter for preprocedural laboratory examination: Secondary | ICD-10-CM | POA: Diagnosis not present

## 2020-06-23 DIAGNOSIS — Z20822 Contact with and (suspected) exposure to covid-19: Secondary | ICD-10-CM | POA: Insufficient documentation

## 2020-06-24 LAB — SARS CORONAVIRUS 2 (TAT 6-24 HRS): SARS Coronavirus 2: NEGATIVE

## 2020-06-25 NOTE — Progress Notes (Signed)
Spoke with patient regarding procedure instructions for tomorrow's procedure.  Nothing to eat or drink after midnight, don't take xarelto today or tomorrow morning.  You will need responsible adult to drive you home tomorrow as well as stay overnight with you.  No lasix in the morning as well.  Arrival time is 10:00

## 2020-06-26 ENCOUNTER — Encounter (HOSPITAL_COMMUNITY)
Admission: RE | Disposition: A | Discharge status: Home / Self Care | Payer: Self-pay | Source: Home / Self Care | Attending: Internal Medicine

## 2020-06-26 ENCOUNTER — Ambulatory Visit (HOSPITAL_COMMUNITY)
Admission: RE | Admit: 2020-06-26 | Discharge: 2020-06-26 | Disposition: A | Discharge status: Home / Self Care | Payer: Medicare Other | Attending: Internal Medicine | Admitting: Internal Medicine

## 2020-06-26 DIAGNOSIS — I447 Left bundle-branch block, unspecified: Secondary | ICD-10-CM | POA: Insufficient documentation

## 2020-06-26 DIAGNOSIS — I350 Nonrheumatic aortic (valve) stenosis: Secondary | ICD-10-CM | POA: Diagnosis not present

## 2020-06-26 DIAGNOSIS — Z880 Allergy status to penicillin: Secondary | ICD-10-CM | POA: Insufficient documentation

## 2020-06-26 DIAGNOSIS — I5022 Chronic systolic (congestive) heart failure: Secondary | ICD-10-CM | POA: Diagnosis not present

## 2020-06-26 DIAGNOSIS — I428 Other cardiomyopathies: Secondary | ICD-10-CM | POA: Insufficient documentation

## 2020-06-26 DIAGNOSIS — I2510 Atherosclerotic heart disease of native coronary artery without angina pectoris: Secondary | ICD-10-CM | POA: Diagnosis not present

## 2020-06-26 DIAGNOSIS — Z79899 Other long term (current) drug therapy: Secondary | ICD-10-CM | POA: Insufficient documentation

## 2020-06-26 DIAGNOSIS — Z87891 Personal history of nicotine dependence: Secondary | ICD-10-CM | POA: Insufficient documentation

## 2020-06-26 DIAGNOSIS — Z006 Encounter for examination for normal comparison and control in clinical research program: Secondary | ICD-10-CM

## 2020-06-26 DIAGNOSIS — Z7901 Long term (current) use of anticoagulants: Secondary | ICD-10-CM | POA: Diagnosis not present

## 2020-06-26 DIAGNOSIS — K912 Postsurgical malabsorption, not elsewhere classified: Secondary | ICD-10-CM | POA: Insufficient documentation

## 2020-06-26 HISTORY — PX: LEFT HEART CATH AND CORONARY ANGIOGRAPHY: CATH118249

## 2020-06-26 SURGERY — LEFT HEART CATH AND CORONARY ANGIOGRAPHY
Anesthesia: LOCAL

## 2020-06-26 MED ORDER — ONDANSETRON HCL 4 MG/2ML IJ SOLN: 4.0000 mg | Freq: Four times a day (QID) | INTRAMUSCULAR | Status: DC | PRN

## 2020-06-26 MED ORDER — SODIUM CHLORIDE 0.9 % IV SOLN: 250.0000 mL | INTRAVENOUS | Status: DC | PRN

## 2020-06-26 MED ORDER — FENTANYL CITRATE (PF) 100 MCG/2ML IJ SOLN
INTRAMUSCULAR | Status: DC | PRN
  Administered 2020-06-26 (×2): 25 ug via INTRAVENOUS

## 2020-06-26 MED ORDER — ACETAMINOPHEN 325 MG PO TABS: 650.0000 mg | ORAL_TABLET | ORAL | Status: DC | PRN

## 2020-06-26 MED ORDER — HEPARIN SODIUM (PORCINE) 1000 UNIT/ML IJ SOLN
INTRAMUSCULAR | Status: AC
  Filled 2020-06-26: qty 1

## 2020-06-26 MED ORDER — SODIUM CHLORIDE 0.9% FLUSH: 3.0000 mL | INTRAVENOUS | Status: DC | PRN

## 2020-06-26 MED ORDER — HEPARIN (PORCINE) IN NACL 1000-0.9 UT/500ML-% IV SOLN
INTRAVENOUS | Status: DC | PRN
  Administered 2020-06-26 (×2): 500 mL

## 2020-06-26 MED ORDER — SODIUM CHLORIDE 0.9% FLUSH: 3.0000 mL | Freq: Two times a day (BID) | INTRAVENOUS | Status: DC

## 2020-06-26 MED ORDER — FENTANYL CITRATE (PF) 100 MCG/2ML IJ SOLN
INTRAMUSCULAR | Status: AC
  Filled 2020-06-26: qty 2

## 2020-06-26 MED ORDER — SODIUM CHLORIDE 0.9 % IV SOLN: INTRAVENOUS | Status: DC

## 2020-06-26 MED ORDER — HYDRALAZINE HCL 20 MG/ML IJ SOLN: 10.0000 mg | INTRAMUSCULAR | Status: DC | PRN

## 2020-06-26 MED ORDER — HEPARIN (PORCINE) IN NACL 1000-0.9 UT/500ML-% IV SOLN
INTRAVENOUS | Status: AC
  Filled 2020-06-26: qty 1000

## 2020-06-26 MED ORDER — ASPIRIN 81 MG PO CHEW
81.0000 mg | CHEWABLE_TABLET | ORAL | Status: AC
  Administered 2020-06-26: 81 mg via ORAL
  Filled 2020-06-26: qty 1

## 2020-06-26 MED ORDER — MIDAZOLAM HCL 2 MG/2ML IJ SOLN
INTRAMUSCULAR | Status: DC | PRN
  Administered 2020-06-26 (×2): 1 mg via INTRAVENOUS

## 2020-06-26 MED ORDER — VERAPAMIL HCL 2.5 MG/ML IV SOLN
INTRAVENOUS | Status: AC
  Filled 2020-06-26: qty 2

## 2020-06-26 MED ORDER — MIDAZOLAM HCL 2 MG/2ML IJ SOLN
INTRAMUSCULAR | Status: AC
  Filled 2020-06-26: qty 2

## 2020-06-26 MED ORDER — LIDOCAINE HCL (PF) 1 % IJ SOLN
INTRAMUSCULAR | Status: AC
  Filled 2020-06-26: qty 30

## 2020-06-26 MED ORDER — HEPARIN SODIUM (PORCINE) 1000 UNIT/ML IJ SOLN
INTRAMUSCULAR | Status: DC | PRN
  Administered 2020-06-26: 3500 [IU] via INTRAVENOUS

## 2020-06-26 MED ORDER — LIDOCAINE HCL (PF) 1 % IJ SOLN
INTRAMUSCULAR | Status: DC | PRN
  Administered 2020-06-26: 2 mL

## 2020-06-26 MED ORDER — VERAPAMIL HCL 2.5 MG/ML IV SOLN
INTRAVENOUS | Status: DC | PRN
  Administered 2020-06-26: 10 mL via INTRA_ARTERIAL

## 2020-06-26 MED ORDER — LABETALOL HCL 5 MG/ML IV SOLN: 10.0000 mg | INTRAVENOUS | Status: DC | PRN

## 2020-06-26 SURGICAL SUPPLY — 14 items
BAG SNAP BAND KOVER 36X36 (MISCELLANEOUS) ×1 IMPLANT
CATH 5FR JL3.5 JR4 ANG PIG MP (CATHETERS) ×1 IMPLANT
CATH INFINITI MULTIPACK ANG 4F (CATHETERS) ×1 IMPLANT
COVER DOME SNAP 22 D (MISCELLANEOUS) ×1 IMPLANT
DEVICE RAD TR BAND REGULAR (VASCULAR PRODUCTS) ×1 IMPLANT
GLIDESHEATH SLEND SS 6F .021 (SHEATH) ×1 IMPLANT
GUIDEWIRE INQWIRE 1.5J.035X260 (WIRE) IMPLANT
INQWIRE 1.5J .035X260CM (WIRE) ×2
KIT HEART LEFT (KITS) ×2 IMPLANT
PACK CARDIAC CATHETERIZATION (CUSTOM PROCEDURE TRAY) ×2 IMPLANT
SYR MEDRAD MARK 7 150ML (SYRINGE) ×3 IMPLANT
TRANSDUCER W/STOPCOCK (MISCELLANEOUS) ×2 IMPLANT
TUBING CIL FLEX 10 FLL-RA (TUBING) ×2 IMPLANT
WIRE HI TORQ VERSACORE-J 145CM (WIRE) ×1 IMPLANT

## 2020-06-26 NOTE — Discharge Instructions (Signed)
Radial Site Care  This sheet gives you information about how to care for yourself after your procedure. Your health care provider may also give you more specific instructions. If you have problems or questions, contact your health care provider. What can I expect after the procedure? After the procedure, it is common to have:  Bruising and tenderness at the catheter insertion area. Follow these instructions at home: Medicines  Take over-the-counter and prescription medicines only as told by your health care provider. Insertion site care  Follow instructions from your health care provider about how to take care of your insertion site. Make sure you: ? Wash your hands with soap and water before you change your bandage (dressing). If soap and water are not available, use hand sanitizer. ? Change your dressing as told by your health care provider. ? Leave stitches (sutures), skin glue, or adhesive strips in place. These skin closures may need to stay in place for 2 weeks or longer. If adhesive strip edges start to loosen and curl up, you may trim the loose edges. Do not remove adhesive strips completely unless your health care provider tells you to do that.  Check your insertion site every day for signs of infection. Check for: ? Redness, swelling, or pain. ? Fluid or blood. ? Pus or a bad smell. ? Warmth.  Do not take baths, swim, or use a hot tub until your health care provider approves.  You may shower 24-48 hours after the procedure, or as directed by your health care provider. ? Remove the dressing and gently wash the site with plain soap and water. ? Pat the area dry with a clean towel. ? Do not rub the site. That could cause bleeding.  Do not apply powder or lotion to the site. Activity  For 24 hours after the procedure, or as directed by your health care provider: ? Do not flex or bend the affected arm. ? Do not push or pull heavy objects with the affected arm. ? Do not drive  yourself home from the hospital or clinic. You may drive 24 hours after the procedure unless your health care provider tells you not to. ? Do not operate machinery or power tools.  Do not lift anything that is heavier than 10 lb (4.5 kg), or the limit that you are told, until your health care provider says that it is safe.  Ask your health care provider when it is okay to: ? Return to work or school. ? Resume usual physical activities or sports. ? Resume sexual activity.   General instructions  If the catheter site starts to bleed, raise your arm and put firm pressure on the site. If the bleeding does not stop, get help right away. This is a medical emergency.  If you went home on the same day as your procedure, a responsible adult should be with you for the first 24 hours after you arrive home.  Keep all follow-up visits as told by your health care provider. This is important. Contact a health care provider if:  You have a fever.  You have redness, swelling, or yellow drainage around your insertion site. Get help right away if:  You have unusual pain at the radial site.  The catheter insertion area swells very fast.  The insertion area is bleeding, and the bleeding does not stop when you hold steady pressure on the area.  Your arm or hand becomes pale, cool, tingly, or numb. These symptoms may represent a serious   problem that is an emergency. Do not wait to see if the symptoms will go away. Get medical help right away. Call your local emergency services (911 in the [place] Do not drive yourself to the hospital. Summary  After the procedure, it is common to have bruising and tenderness at the site.  Follow instructions from your health care provider about how to take care of your radial site wound. Check the wound every day for signs of infection.  Do not lift anything that is heavier than 10 lb (4.5 kg), or the limit that you are told, until your health care provider says that it  is safe. This information is not intended to replace advice given to you by your health care provider. Make sure you discuss any questions you have with your health care provider. Document Revised: 06/03/2017 Document Reviewed: 06/03/2017 Elsevier Patient Education  2021 Elsevier Inc.  

## 2020-06-26 NOTE — Research (Signed)
IDENTIFY Informed Consent                  Subject Name: [name] [name]   Subject met inclusion and exclusion criteria.  The informed consent form, study requirements and expectations were reviewed with the subject and questions and concerns were addressed prior to the signing of the consent form.  The subject verbalized understanding of the trial requirements.  The subject agreed to participate in the IDENTIFY trial and signed the informed consent at 10:58AM on 06/26/20.  The informed consent was obtained prior to performance of any protocol-specific procedures for the subject.  A copy of the signed informed consent was given to the subject and a copy was placed in the subject's medical record.   Meade Maw, Naval architect

## 2020-06-26 NOTE — Progress Notes (Signed)
Pt ambulated without difficulty or bleeding.   Discharged home with husband who will drive and stay with pt x 24 hrs  

## 2020-06-26 NOTE — Interval H&P Note (Signed)
History and Physical Interval Note:  06/26/2020 12:51 PM  [name] [name]  has presented today for surgery, with the diagnosis of heart failure.  The various methods of treatment have been discussed with the patient and family. After consideration of risks, benefits and other options for treatment, the patient has consented to  Procedure(s): LEFT HEART CATH AND CORONARY ANGIOGRAPHY (N/A) and possible coronary angioplasty as a surgical intervention.  The patient's history has been reviewed, patient examined, no change in status, stable for surgery.  I have reviewed the patient's chart and labs.  Questions were answered to the patient's satisfaction.     Daniel Bensimhon

## 2020-06-27 ENCOUNTER — Encounter (HOSPITAL_COMMUNITY): Payer: Self-pay | Admitting: Internal Medicine

## 2020-07-03 NOTE — Progress Notes (Incomplete)
***In Progress***  Referring Physician: Dr. Johnsie Cancel Primary Care: No primary care provider on file. Primary Cardiologist: Dr. 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 Johnsie Cancel for further evalaution of her progressive cardiomyopathy.   Echo 2015 found to have EF 45% with inferior wall and apical HK - she did not want to have heart cath. Myoview done at that time showed inferior/ apical scar with no ischemia She was not on beta blocker due to bradycardia and first degree block at baseline.   Carotid duplex 07/19/15 40-59% RICA stenosisrepeat 01/13/20 plaque no stenosis with new lab criteria Echo 12/03/16 EF 40-45% AV sclerosis mean gradient 6 mmHg  She saw Dr. Johnsie Cancel in 02/2020 and refused cath again. Echo performed 03/2020 to reassess EF and EF 20-25% mild AS mean gradient 10. 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 Deferred any venography intervention  She returned for CHF follow up with Dr. Haroldine Laws on 06/15/20. She was with her daughter, [name] [name]. She remained on TPN 4 nights per week. Said she did whatever she wanted to do. No CP or SOB. Did have some mild LE edema. No orthopnea or PND. Could go up and down stairs. She was tolerating Entresto okay.  Today she returns to HF clinic for pharmacist medication titration. At last visit with MD, spironolactone 12.5 mg daily was initiated.   Overall feeling ***. Dizziness, lightheadedness, fatigue:  Chest pain or palpitations:  How is your breathing?: *** SOB: Able to complete all ADLs. Activity level ***  Weight at home pounds. Takes furosemide/torsemide/bumex *** mg *** daily.   LEE PND/Orthopnea  Appetite *** Low-salt diet:   Physical Exam Cost/affordability of meds   HF Medications: Entresto 24-26 mg BID Spironolactone 12.5 mg daily Furosemide 20 mg PRN  Has the patient been experiencing any side effects to the medications prescribed?  {YES NO:22349}  Does the patient have any problems obtaining medications due to transportation or finances?   {YES NO:22349}  Understanding of regimen: {excellent/good/fair/poor:19665} Understanding of indications: {excellent/good/fair/poor:19665} Potential of compliance: {excellent/good/fair/poor:19665} Patient understands to avoid NSAIDs. Patient understands to avoid decongestants.    Pertinent Lab Values (06/15/20) . Serum creatinine 0.97, BUN 12, Potassium 3.8, Sodium 137   Vital Signs: . Weight: *** (last clinic weight: 145.4 lbs) . Blood pressure: ***  . Heart rate: ***   Assessment/Plan:  1. Cardiomyopathy  - EF 45% echo 2015 RWMA inferior wall and apex she did not want to have heart cath.  - Myoview 2015  EF 47% inferior/ apical scar with no ischemia - Echo 12/03/16 reviewed EF 40-45% AV sclerosis mean gradient 6 mmHg - Echo 03/2020 EF 20-25% mild AS mean gradient 10  - NYHA class I symptoms - Volume status mildly elevated but hard to assess with venous occlusions - Continue furosemide 20 mg PRN. - Not on b-blocker in bast due to bradycardia. - Continue Entresto 24/26 mg BID. - Continue spironolactone 12.5 mg daily.   2. Hypercoaguable state - chronically occluded RIJ - history of mesenteric infarct  - on Xarelto  3. 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 Has limited venous access  4. LBBB (165ms) -  as above  5. Aortic stenosis - mild by echo    Audry Riles, PharmD, BCPS, BCCP, CPP Heart Failure Clinic Pharmacist [phone]

## 2020-07-04 ENCOUNTER — Other Ambulatory Visit: Payer: Self-pay

## 2020-07-04 ENCOUNTER — Ambulatory Visit (HOSPITAL_COMMUNITY)
Admission: RE | Admit: 2020-07-04 | Discharge: 2020-07-04 | Disposition: A | Discharge status: Home / Self Care | Payer: Medicare Other | Source: Ambulatory Visit | Attending: Cardiology | Admitting: Cardiology

## 2020-07-04 ENCOUNTER — Encounter (HOSPITAL_COMMUNITY): Payer: Self-pay

## 2020-07-04 VITALS — BP 102/50 | HR 70 | Wt 150.8 lb

## 2020-07-04 DIAGNOSIS — Z87891 Personal history of nicotine dependence: Secondary | ICD-10-CM | POA: Insufficient documentation

## 2020-07-04 DIAGNOSIS — I350 Nonrheumatic aortic (valve) stenosis: Secondary | ICD-10-CM | POA: Diagnosis not present

## 2020-07-04 DIAGNOSIS — I5022 Chronic systolic (congestive) heart failure: Secondary | ICD-10-CM

## 2020-07-04 DIAGNOSIS — K912 Postsurgical malabsorption, not elsewhere classified: Secondary | ICD-10-CM | POA: Insufficient documentation

## 2020-07-04 DIAGNOSIS — D6859 Other primary thrombophilia: Secondary | ICD-10-CM | POA: Insufficient documentation

## 2020-07-04 DIAGNOSIS — I447 Left bundle-branch block, unspecified: Secondary | ICD-10-CM | POA: Diagnosis not present

## 2020-07-04 DIAGNOSIS — I429 Cardiomyopathy, unspecified: Secondary | ICD-10-CM | POA: Diagnosis not present

## 2020-07-04 MED ORDER — SPIRONOLACTONE 25 MG PO TABS: 25.0000 mg | ORAL_TABLET | Freq: Every day | ORAL | 11 refills | Status: DC

## 2020-07-04 NOTE — Patient Instructions (Signed)
It was a pleasure seeing you today! ° °MEDICATIONS: °-We are changing your medications today °- Increase spironolactone to 25 mg (1 tablet) daily.  °-Call if you have questions about your medications. ° ° °NEXT APPOINTMENT: °Return to clinic in 4 weeks with Pharmacy Clinic. ° °In general, to take care of your heart failure: °-Limit your fluid intake to 2 Liters (half-gallon) per day.   °-Limit your salt intake to ideally 2-3 grams (2000-3000 mg) per day. °-Weigh yourself daily and record, and bring that "weight diary" to your next appointment.  (Weight gain of 2-3 pounds in 1 day typically means fluid weight.) °-The medications for your heart are to help your heart and help you live longer.   °-Please contact us before stopping any of your heart medications. ° °Call the clinic at [phone] with questions or to reschedule future appointments.  °

## 2020-07-04 NOTE — Progress Notes (Signed)
Referring Physician: Dr. Johnsie Cancel Primary Care: No primary care provider on file. Primary Cardiologist: Dr. 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 Johnsie Cancel for further evaluation of her progressive cardiomyopathy.   Echo 2015 found to have EF 45% with inferior wall and apical HK - she did not want to have heart cath. Myoview done at that time showed inferior/ apical scar with no ischemia She was not on beta blocker due to bradycardia and first degree block at baseline.   Carotid duplex 07/19/15 40-59% RICA stenosisrepeat 01/13/20 plaque no stenosis with new lab criteria Echo 12/03/16 EF 40-45% AV sclerosis mean gradient 6 mmHg  She saw Dr. Johnsie Cancel in 02/2020 and refused cath again. Echo performed 03/2020 to reassess EF and EF 20-25% mild AS mean gradient 10. Referred here.  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 Deferred any venography intervention  She returned for CHF follow up with Dr. Haroldine Laws on 06/15/20. She was with her daughter, [name] [name]. She remained on TPN 4 nights per week. No CP or SOB. Did have some mild LE edema. No orthopnea or PND. Could go up and down stairs. She was tolerating Entresto okay.  Today she returns to HF clinic for pharmacist medication titration. At last visit with MD, spironolactone 12.5 mg daily was initiated. Patient reports feeling slower today due to back pain. She experiences occasional dizziness/lightheadedness, usually after taking Entresto or spironolactone. This does not usually last long for her and she reports it is not bothersome. No fatigue. No chest pain or palpitations. Patient reports not feeling SOB during  normal activities. Weight at home fluctuates with TPN usage, but remains stable around 145-150 lbs. She used furosemide yesterday for LEE and reports using PRN furosemide several times per week. Trace bilateral LEE today, slightly more than her normal (likely from walking more yesterday). She plans to take an extra dose of furosemide today.  No PND/Orthopnea. Her appetite is doing well. Patient reports that she gets TPN four days per week (every Mon/Tues/Wed/Fri). She also usually gets a 900 mL bag  Of NS for hydration when feeling dry, usually every Sunday. She gets monthly labs at the end of every month that are visible in Epic.   HF Medications: Entresto 24-26 mg BID Spironolactone 12.5 mg daily Furosemide 20 mg PRN  Has the patient been experiencing any side effects to the medications prescribed?  No, patient is tolerating her medications well.   Does the patient have any problems obtaining medications due to transportation or finances?   No, patient has Medicare part D. Currently in catastrophic coverage. No issues with affordability.   Understanding of regimen: good Understanding of indications: good Potential of compliance: good Patient understands to avoid NSAIDs. Patient understands to avoid decongestants.    Pertinent Lab Values (06/15/20) . Serum creatinine 0.97, BUN 12, Potassium 3.8, Sodium 137   Vital Signs: . Weight: 150.8 lbs (last clinic weight: 145.4 lbs) . Blood pressure: 102/50  . Heart rate: 70  Assessment/Plan:  1. Cardiomyopathy  - EF 45% echo 2015 RWMA inferior wall and apex she did not want to have heart cath.  - Myoview 2015  EF 47% inferior/ apical scar with no  ischemia - Echo 12/03/16 reviewed EF 40-45% AV sclerosis mean gradient 6 mmHg - Echo 03/2020 EF 20-25% mild AS mean gradient 10  - NYHA class I symptoms, trace bilateral LEE on exam. Patient plans on taking a dose of PRN furosemide tonight.  - Continue furosemide 20 mg PRN. - Not on beta blocker in  the past due to bradycardia. - Continue Entresto 24-26 mg BID.  - Increase spironolactone to 25 mg daily. Repeat BMET during her monthly lab test next week. Of note, if potassium level returns elevated, can consider adjusting amount of K in TPN rather than adjusting spironolactone. Patient has number for specialty pharmacy who provides TPN if this route is chosen.  - Discussed the possibility of starting [place] or [place] next visit. Patient seems amenable.  - Follow up with HF pharmacy clinic on 07/31/20.    2. Hypercoaguable state - chronically occluded RIJ - history of mesenteric infarct  - on Xarelto  3. 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 Has limited venous access  4. LBBB (114ms) - as above  5. Aortic stenosis - mild by echo    Audry Riles, PharmD, BCPS, BCCP, CPP Heart Failure Clinic Pharmacist [phone]

## 2020-07-10 DIAGNOSIS — I639 Cerebral infarction, unspecified: Secondary | ICD-10-CM

## 2020-07-10 HISTORY — DX: Cerebral infarction, unspecified: I63.9

## 2020-07-11 ENCOUNTER — Telehealth: Payer: Self-pay

## 2020-07-11 NOTE — Chronic Care Management (AMB) (Signed)
Chronic Care Management Pharmacy Assistant   Name: [name] [name]  MRN: [id number] DOB: 1950-06-20  Reason for Encounter: Disease State/ General Adherence Call  PCP : [name], MD  Allergies:   Allergies  Allergen Reactions  . Penicillins Itching, Swelling and Other (See Comments)    Reaction:  Facial swelling Has patient had a PCN reaction causing immediate rash, facial/tongue/throat swelling, SOB or lightheadedness with hypotension: Yes Has patient had a PCN reaction causing severe rash involving mucus membranes or skin necrosis: No Has patient had a PCN reaction that required hospitalization No Has patient had a PCN reaction occurring within the last 10 years: No If all of the above answers are "NO", then may proceed with Cephalosporin use.    Medications: Outpatient Encounter Medications as of 07/11/2020  Medication Sig Note  . acetaminophen (TYLENOL) 500 MG tablet Take 1,000 mg by mouth daily as needed for mild pain.   . ADULT TPN Inject 1,800 mLs into the vein 4 (four) times a week. Pt receives home TPN from [facility]:  1800 mL bag, four nights weekly (Monday, Tuesday, Wednesday, Thursday for 8 hours (includes 1 hour taper up and down).   . Calcium Carb-Cholecalciferol (CALCIUM + D3 PO) Take 2 tablets by mouth daily.   . cetirizine (ZYRTEC) 10 MG tablet Take 1 tablet (10 mg total) by mouth daily. (Patient taking differently: Take 10 mg by mouth every evening.)   . clobetasol ointment (TEMOVATE) 7.71 % Apply 1 application topically 2 (two) times daily. (Patient taking differently: Apply 1 application topically 2 (two) times daily as needed (skin irritation).)   . denosumab (PROLIA) 60 MG/ML SOSY injection Inject 60 mg into the skin every 6 (six) months. (Patient not taking: Reported on 07/04/2020) 06/19/2020: On hold   . diphenhydrAMINE (BENADRYL) 25 mg capsule Take 25-50 mg by mouth every 6 (six) hours as needed for itching or sleep.    . fluticasone (FLONASE) 50 MCG/ACT  nasal spray SPRAY 2 SPRAYS INTO EACH NOSTRIL EVERY DAY (Patient taking differently: Place 1-2 sprays into both nostrils daily as needed for allergies.)   . furosemide (LASIX) 20 MG tablet Take one tablet by mouth as needed for swelling (Patient taking differently: Take 20 mg by mouth daily as needed (swelling).)   . Heparin Sodium, Porcine, (HEPARIN LOCK FLUSH IJ) Inject 5 mLs as directed 4 (four) times a week. Monday, Tuesday, Wednesday, Thursday in the morning with TPN   . Multiple Vitamins-Minerals (ADULT GUMMY PO) Take 2 tablets by mouth daily.   Marland Kitchen omeprazole (PRILOSEC) 40 MG capsule TAKE 1 CAPSULE 2 (TWO) TIMES DAILY BY MOUTH. TAKE BEFORE BREAKFAST AND SUPPER (Patient taking differently: Take 40 mg by mouth daily before breakfast. TAKE 1 CAPSULE 2 (TWO) TIMES DAILY BY MOUTH. TAKE BEFORE BREAKFAST AND SUPPER)   . ondansetron (ZOFRAN-ODT) 8 MG disintegrating tablet TAKE 1 TABLET (8 MG TOTAL) BY MOUTH EVERY 8 (EIGHT) HOURS AS NEEDED FOR NAUSEA OR VOMITING.   . sacubitril-valsartan (ENTRESTO) 24-26 MG Take 1 tablet by mouth 2 (two) times daily.   . sodium chloride 0.9 % infusion Inject 900 mLs into the vein every 14 (fourteen) days. Uses for TPN   . spironolactone (ALDACTONE) 25 MG tablet Take 1 tablet (25 mg total) by mouth daily.   . Teduglutide, rDNA, (GATTEX) 5 MG KIT Inject 3.3 Units into the skin daily. (Patient taking differently: Inject 3.1 Units into the skin daily.)   . vitamin E 1000 UNIT capsule Take 1,000 Units by mouth  daily. (Patient not taking: Reported on 07/04/2020)   . XARELTO 20 MG TABS tablet TAKE 1 TABLET (20 MG TOTAL) BY MOUTH DAILY WITH SUPPER. (Patient taking differently: Take 20 mg by mouth daily with supper.)    No facility-administered encounter medications on file as of 07/11/2020.    Current Diagnosis: Patient Active Problem List   Diagnosis Date Noted  . 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 Hypotension 12/30/2017  . Bradycardia 12/30/2017  . 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 Degenerative joint disease 07/08/2016  . PICC line infection 05/16/2015  . 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 Splenomegaly   . Hx of adenomatous colonic polyps 04/18/2012  . Chronic systolic heart failure ([place]) 12/25/2011  . Osteoporosis 03/14/2010  . Anemia of chronic disease 10/01/2009  . GERD (gastroesophageal reflux disease) 11/19/2008  . ALKALINE PHOSPHATASE, ELEVATED 10/12/2008  . VITAMIN D DEFICIENCY 01/19/2008  . TRANSAMINASES, SERUM, ELEVATED 01/19/2008  . Bacterial overgrowth syndrome 11/19/2004  . Acquired short bowel syndrome 11/20/2003    Reviewed chart for medication changes ahead of disease state call.  06/15/2020 OV (cardiology) Dr. Haroldine Laws CHF, EF 45% echo 2015, EF 25-25% 11/21, start spironolactone 12.5 mg, plan coronary angiography to define anatomy.  06/26/2020 Procedure Dr. Haroldine Laws;  Cardiac Catheterization   07/04/2020  [facility]iology [facility] Audry Riles, PharmD, BCPS, BCCP, CPP, increase spironolactone to 25 mg daily.  Have you had any problems recently with your health? Patient recently had heart catheterization on 06/26/2020, she states she is currently awaiting an MRI of her heart.  Have you had any problems with your pharmacy? Patient states she has not had any problems recently with her pharmacy.  What issues or side effects are you having with your medications? Patient states she has some mild dizziness. Patient states she is tolerating the dizziness the best she can as she knows she needs to take her heart medications.  What would you like me to pass along to Madelin Rear, CPP for him to help you with?  Patient states she doesn't have anything for me to pass along at this time.  What can we do to take care of you better? Patient states she is doing well at this time.  Future Appointments  Date Time Provider [place]  07/31/2020   1:00 PM [facility] PH[facility]ACY [facility] None  08/13/2020 11:20 AM Bensimhon, Shaune Pascal, MD [facility] None  10/12/2020  3:30 PM [facility] PHARMACIST [facility]    Stephana D Calhoun, [place] Pharmacist Assistant [phone]    Follow-Up:  Pharmacist Review

## 2020-07-16 ENCOUNTER — Other Ambulatory Visit (INDEPENDENT_AMBULATORY_CARE_PROVIDER_SITE_OTHER): Payer: Medicare Other

## 2020-07-16 DIAGNOSIS — K912 Postsurgical malabsorption, not elsewhere classified: Secondary | ICD-10-CM | POA: Diagnosis not present

## 2020-07-16 LAB — CBC
HCT: 28.2 % — ABNORMAL LOW (ref 36.0–46.0)
Hemoglobin: 9.2 g/dL — ABNORMAL LOW (ref 12.0–15.0)
MCHC: 32.7 g/dL (ref 30.0–36.0)
MCV: 95.9 fl (ref 78.0–100.0)
Platelets: 111 10*3/uL — ABNORMAL LOW (ref 150.0–400.0)
RBC: 2.94 Mil/uL — ABNORMAL LOW (ref 3.87–5.11)
RDW: 14.5 % (ref 11.5–15.5)
WBC: 5.5 10*3/uL (ref 4.0–10.5)

## 2020-07-16 LAB — COMPREHENSIVE METABOLIC PANEL
ALT: 12 U/L (ref 0–35)
AST: 12 U/L (ref 0–37)
Albumin: 3 g/dL — ABNORMAL LOW (ref 3.5–5.2)
Alkaline Phosphatase: 100 U/L (ref 39–117)
BUN: 24 mg/dL — ABNORMAL HIGH (ref 6–23)
CO2: 19 mEq/L (ref 19–32)
Calcium: 8.3 mg/dL — ABNORMAL LOW (ref 8.4–10.5)
Chloride: 112 mEq/L (ref 96–112)
Creatinine, Ser: 1.03 mg/dL (ref 0.40–1.20)
GFR: 55.21 mL/min — ABNORMAL LOW (ref >60.00)
Glucose, Bld: 83 mg/dL (ref 70–99)
Potassium: 3.6 mEq/L (ref 3.5–5.1)
Sodium: 138 mEq/L (ref 135–145)
Total Bilirubin: 0.4 mg/dL (ref 0.2–1.2)
Total Protein: 7.1 g/dL (ref 6.0–8.3)

## 2020-07-16 LAB — MAGNESIUM: Magnesium: 2 mg/dL (ref 1.5–2.5)

## 2020-07-16 LAB — PHOSPHORUS: Phosphorus: 2.6 mg/dL (ref 2.3–4.6)

## 2020-07-16 LAB — VITAMIN B12: Vitamin B-12: 504 pg/mL (ref 211–911)

## 2020-07-16 LAB — C-REACTIVE PROTEIN: CRP: 1.8 mg/dL (ref 0.5–20.0)

## 2020-07-18 ENCOUNTER — Encounter (HOSPITAL_COMMUNITY): Payer: Self-pay

## 2020-07-19 ENCOUNTER — Other Ambulatory Visit (HOSPITAL_COMMUNITY): Payer: Self-pay | Admitting: *Deleted

## 2020-07-19 DIAGNOSIS — I5022 Chronic systolic (congestive) heart failure: Secondary | ICD-10-CM

## 2020-07-19 NOTE — Progress Notes (Incomplete)
***In Progress*** Referring Physician: Dr. Johnsie Cancel Primary Care: No primary care provider on file. Primary Cardiologist: Dr. 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 Johnsie Cancel for further evaluation of her progressive cardiomyopathy.   Echo 2015 found to have EF 45% with inferior wall and apical HK - she did not want to have heart cath. Myoview done at that time showed inferior/ apical scar with no ischemia She was not on beta blocker due to bradycardia and first degree block at baseline.   Carotid duplex 07/19/15 40-59% RICA stenosisrepeat 01/13/20 plaque no stenosis with new lab criteria Echo 12/03/16 EF 40-45% AV sclerosis mean gradient 6 mmHg  She saw Dr. Johnsie Cancel in 02/2020 and refused cath again. Echo performed 03/2020 to reassess EF and EF 20-25% mild AS mean gradient 10. Referred here.  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 Deferred any venography intervention  She returned for CHF follow up with Dr. Haroldine Laws on 06/15/20. She was with her daughter, [name] [name]. She remained on TPN 4 nights per week. No CP or SOB. Did have some mild LE edema. No orthopnea or PND. Could go up and down stairs. She was tolerating Entresto okay.  She returned to HF clinic for pharmacist medication titration. At last visit with MD, spironolactone 12.5 mg daily was initiated. Patient reported feeling slower today due to back pain. She reported occasional dizziness/lightheadedness, usually after taking Entresto or spironolactone. This does not usually last long for her and she reports it is not bothersome. No fatigue. No chest pain or palpitations. Patient reported not feeling SOB  during normal activities. Weight at home fluctuates with TPN usage, but remains stable around 145-150 lbs. She reported using PRN furosemide several times per week. Trace bilateral LEE, slightly more than her normal (likely from walking more yesterday). She planned to take an extra dose of furosemide after clinic.  No PND/Orthopnea. Her appetite was doing well. Patient reported that she gets TPN four days per week (every Mon/Tues/Wed/Fri). She also usually gets a 900 mL bag of NS for hydration when feeling dry, usually every Sunday. She gets monthly labs at the end of every month that are visible in Epic.   HF Medications: Entresto 24-26 mg BID Spironolactone 25 mg daily Furosemide 20 mg PRN  Has the patient been experiencing any side effects to the medications prescribed?  No, patient is tolerating her medications well.   Does the patient have any problems obtaining medications due to transportation or finances?   No, patient has Medicare part D. Currently in catastrophic coverage. No issues with affordability.   Understanding of regimen: good Understanding of indications: good Potential of compliance: good Patient understands to avoid NSAIDs. Patient understands to avoid decongestants.    Pertinent Lab Values (07/16/20) . Serum creatinine 1.03, BUN 24, Potassium 3.6, Sodium 138   Vital Signs:*** . Weight: 150.8 lbs (last clinic weight: 145.4 lbs) . Blood pressure: 102/50  . Heart rate: 70  Assessment/Plan:  1. Cardiomyopathy  - EF 45% echo 2015 RWMA inferior wall and apex she did not want to have heart cath.  - Myoview 2015  EF 47% inferior/ apical scar with no ischemia - Echo 12/03/16 reviewed EF  40-45% AV sclerosis mean gradient 6 mmHg - Echo 03/2020 EF 20-25% mild AS mean gradient 10  - NYHA class I symptoms, trace bilateral LEE on exam. Patient plans on taking a dose of PRN furosemide tonight. *** - Continue furosemide 20 mg PRN. - Not on beta blocker in the past due to  bradycardia. - Continue Entresto 24-26 mg BID.  - Continue spironolactone 25 mg daily. Repeat BMET during her monthly lab test next week. Of note, if potassium level returns elevated, can consider adjusting amount of K in TPN rather than adjusting spironolactone. Patient has number for specialty pharmacy who provides TPN if this route is chosen.  - Discussed the possibility of starting [place] or [place] next visit. Patient seems amenable.  - Follow up with HF pharmacy clinic on 07/31/20.    2. Hypercoaguable state - chronically occluded RIJ - history of mesenteric infarct  - on Xarelto  3. 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 Has limited venous access  4. LBBB (179ms) - as above  5. Aortic stenosis - mild by echo    Audry Riles, PharmD, BCPS, BCCP, CPP Heart Failure Clinic Pharmacist [phone]

## 2020-07-19 NOTE — Telephone Encounter (Signed)
I suspect most of those symptoms not related to her heart based on the findings of her heart cath. 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 Let's go ahead and order.

## 2020-07-23 ENCOUNTER — Emergency Department (HOSPITAL_BASED_OUTPATIENT_CLINIC_OR_DEPARTMENT_OTHER): Payer: Medicare Other

## 2020-07-23 ENCOUNTER — Other Ambulatory Visit: Payer: Self-pay

## 2020-07-23 ENCOUNTER — Inpatient Hospital Stay (HOSPITAL_BASED_OUTPATIENT_CLINIC_OR_DEPARTMENT_OTHER)
Admission: EM | Admit: 2020-07-23 | Discharge: 2020-07-26 | DRG: 065 | Disposition: A | Discharge status: Home Health | Payer: Medicare Other | Attending: Internal Medicine | Admitting: Internal Medicine

## 2020-07-23 ENCOUNTER — Encounter (HOSPITAL_BASED_OUTPATIENT_CLINIC_OR_DEPARTMENT_OTHER): Payer: Self-pay | Admitting: *Deleted

## 2020-07-23 DIAGNOSIS — L89151 Pressure ulcer of sacral region, stage 1: Secondary | ICD-10-CM | POA: Diagnosis present

## 2020-07-23 DIAGNOSIS — R29702 NIHSS score 2: Secondary | ICD-10-CM | POA: Diagnosis not present

## 2020-07-23 DIAGNOSIS — N1831 Chronic kidney disease, stage 3a: Secondary | ICD-10-CM

## 2020-07-23 DIAGNOSIS — I639 Cerebral infarction, unspecified: Secondary | ICD-10-CM | POA: Diagnosis not present

## 2020-07-23 DIAGNOSIS — D696 Thrombocytopenia, unspecified: Secondary | ICD-10-CM

## 2020-07-23 DIAGNOSIS — D6859 Other primary thrombophilia: Secondary | ICD-10-CM | POA: Diagnosis not present

## 2020-07-23 DIAGNOSIS — E7140 Disorder of carnitine metabolism, unspecified: Secondary | ICD-10-CM | POA: Diagnosis present

## 2020-07-23 DIAGNOSIS — Z86718 Personal history of other venous thrombosis and embolism: Secondary | ICD-10-CM

## 2020-07-23 DIAGNOSIS — Z8249 Family history of ischemic heart disease and other diseases of the circulatory system: Secondary | ICD-10-CM

## 2020-07-23 DIAGNOSIS — M810 Age-related osteoporosis without current pathological fracture: Secondary | ICD-10-CM | POA: Diagnosis present

## 2020-07-23 DIAGNOSIS — E559 Vitamin D deficiency, unspecified: Secondary | ICD-10-CM | POA: Diagnosis present

## 2020-07-23 DIAGNOSIS — Z880 Allergy status to penicillin: Secondary | ICD-10-CM

## 2020-07-23 DIAGNOSIS — G8194 Hemiplegia, unspecified affecting left nondominant side: Secondary | ICD-10-CM | POA: Diagnosis present

## 2020-07-23 DIAGNOSIS — I6389 Other cerebral infarction: Principal | ICD-10-CM | POA: Diagnosis present

## 2020-07-23 DIAGNOSIS — Z87891 Personal history of nicotine dependence: Secondary | ICD-10-CM

## 2020-07-23 DIAGNOSIS — Z79899 Other long term (current) drug therapy: Secondary | ICD-10-CM

## 2020-07-23 DIAGNOSIS — R9431 Abnormal electrocardiogram [ECG] [EKG]: Secondary | ICD-10-CM | POA: Diagnosis not present

## 2020-07-23 DIAGNOSIS — I6523 Occlusion and stenosis of bilateral carotid arteries: Secondary | ICD-10-CM | POA: Diagnosis not present

## 2020-07-23 DIAGNOSIS — K912 Postsurgical malabsorption, not elsewhere classified: Secondary | ICD-10-CM | POA: Diagnosis not present

## 2020-07-23 DIAGNOSIS — D638 Anemia in other chronic diseases classified elsewhere: Secondary | ICD-10-CM | POA: Diagnosis present

## 2020-07-23 DIAGNOSIS — L8990 Pressure ulcer of unspecified site, unspecified stage: Secondary | ICD-10-CM | POA: Insufficient documentation

## 2020-07-23 DIAGNOSIS — I130 Hypertensive heart and chronic kidney disease with heart failure and stage 1 through stage 4 chronic kidney disease, or unspecified chronic kidney disease: Secondary | ICD-10-CM | POA: Diagnosis present

## 2020-07-23 DIAGNOSIS — E162 Hypoglycemia, unspecified: Secondary | ICD-10-CM | POA: Diagnosis present

## 2020-07-23 DIAGNOSIS — N183 Chronic kidney disease, stage 3 unspecified: Secondary | ICD-10-CM

## 2020-07-23 DIAGNOSIS — Z9049 Acquired absence of other specified parts of digestive tract: Secondary | ICD-10-CM

## 2020-07-23 DIAGNOSIS — R29703 NIHSS score 3: Secondary | ICD-10-CM | POA: Diagnosis not present

## 2020-07-23 DIAGNOSIS — Z8673 Personal history of transient ischemic attack (TIA), and cerebral infarction without residual deficits: Secondary | ICD-10-CM

## 2020-07-23 DIAGNOSIS — R29818 Other symptoms and signs involving the nervous system: Secondary | ICD-10-CM | POA: Diagnosis not present

## 2020-07-23 DIAGNOSIS — K90829 Short bowel syndrome, unspecified: Secondary | ICD-10-CM | POA: Diagnosis present

## 2020-07-23 DIAGNOSIS — I5022 Chronic systolic (congestive) heart failure: Secondary | ICD-10-CM | POA: Diagnosis present

## 2020-07-23 DIAGNOSIS — Z86010 Personal history of colonic polyps: Secondary | ICD-10-CM

## 2020-07-23 DIAGNOSIS — I5042 Chronic combined systolic (congestive) and diastolic (congestive) heart failure: Secondary | ICD-10-CM | POA: Diagnosis present

## 2020-07-23 DIAGNOSIS — Z20822 Contact with and (suspected) exposure to covid-19: Secondary | ICD-10-CM | POA: Diagnosis present

## 2020-07-23 DIAGNOSIS — I6300 Cerebral infarction due to thrombosis of unspecified precerebral artery: Secondary | ICD-10-CM

## 2020-07-23 DIAGNOSIS — Z7901 Long term (current) use of anticoagulants: Secondary | ICD-10-CM

## 2020-07-23 DIAGNOSIS — I440 Atrioventricular block, first degree: Secondary | ICD-10-CM | POA: Diagnosis present

## 2020-07-23 LAB — CBC
HCT: 29.3 % — ABNORMAL LOW (ref 36.0–46.0)
Hemoglobin: 9.3 g/dL — ABNORMAL LOW (ref 12.0–15.0)
MCH: 31.7 pg (ref 26.0–34.0)
MCHC: 31.7 g/dL (ref 30.0–36.0)
MCV: 100 fL (ref 80.0–100.0)
Platelets: 115 10*3/uL — ABNORMAL LOW (ref 150–400)
RBC: 2.93 MIL/uL — ABNORMAL LOW (ref 3.87–5.11)
RDW: 14.2 % (ref 11.5–15.5)
WBC: 10.7 10*3/uL — ABNORMAL HIGH (ref 4.0–10.5)
nRBC: 0 % (ref 0.0–0.2)

## 2020-07-23 LAB — DIFFERENTIAL
Abs Immature Granulocytes: 0.06 10*3/uL (ref 0.00–0.07)
Basophils Absolute: 0 10*3/uL (ref 0.0–0.1)
Basophils Relative: 0 %
Eosinophils Absolute: 0.2 10*3/uL (ref 0.0–0.5)
Eosinophils Relative: 1 %
Immature Granulocytes: 1 %
Lymphocytes Relative: 7 %
Lymphs Abs: 0.8 10*3/uL (ref 0.7–4.0)
Monocytes Absolute: 0.6 10*3/uL (ref 0.1–1.0)
Monocytes Relative: 6 %
Neutro Abs: 9 10*3/uL — ABNORMAL HIGH (ref 1.7–7.7)
Neutrophils Relative %: 85 %
Smear Review: DECREASED

## 2020-07-23 LAB — COMPREHENSIVE METABOLIC PANEL
ALT: 15 U/L (ref 0–44)
AST: 19 U/L (ref 15–41)
Albumin: 2.9 g/dL — ABNORMAL LOW (ref 3.5–5.0)
Alkaline Phosphatase: 94 U/L (ref 38–126)
Anion gap: 7 (ref 5–15)
BUN: 10 mg/dL (ref 8–23)
CO2: 14 mmol/L — ABNORMAL LOW (ref 22–32)
Calcium: 7.9 mg/dL — ABNORMAL LOW (ref 8.9–10.3)
Chloride: 112 mmol/L — ABNORMAL HIGH (ref 98–111)
Creatinine, Ser: 1.26 mg/dL — ABNORMAL HIGH (ref 0.44–1.00)
GFR, Estimated: 46 mL/min — ABNORMAL LOW (ref >60)
Glucose, Bld: 81 mg/dL (ref 70–99)
Potassium: 3 mmol/L — ABNORMAL LOW (ref 3.5–5.1)
Sodium: 133 mmol/L — ABNORMAL LOW (ref 135–145)
Total Bilirubin: 0.3 mg/dL (ref 0.3–1.2)
Total Protein: 7.2 g/dL (ref 6.5–8.1)

## 2020-07-23 LAB — PROTIME-INR
INR: 1.8 — ABNORMAL HIGH (ref 0.8–1.2)
Prothrombin Time: 19.9 seconds — ABNORMAL HIGH (ref 11.4–15.2)

## 2020-07-23 LAB — APTT: aPTT: 35 seconds (ref 24–36)

## 2020-07-23 LAB — RESP PANEL BY RT-PCR (FLU A&B, COVID) ARPGX2
Influenza A by PCR: NEGATIVE
Influenza B by PCR: NEGATIVE
SARS Coronavirus 2 by RT PCR: NEGATIVE

## 2020-07-23 LAB — MAGNESIUM: Magnesium: 1.7 mg/dL (ref 1.7–2.4)

## 2020-07-23 MED ORDER — LACTATED RINGERS IV BOLUS
500.0000 mL | Freq: Once | INTRAVENOUS | Status: AC
  Administered 2020-07-23: 500 mL via INTRAVENOUS

## 2020-07-23 MED ORDER — IOHEXOL 350 MG/ML SOLN
80.0000 mL | Freq: Once | INTRAVENOUS | Status: AC | PRN
  Administered 2020-07-23: 80 mL via INTRAVENOUS

## 2020-07-23 NOTE — ED Triage Notes (Addendum)
C/o left side weakness onset Saturday denies pain no sob,  Husband states pt was slurring speech 45 minutes ago lasting less than 1 hour  No slurred speech noted at present,  Answers all questions

## 2020-07-23 NOTE — ED Provider Notes (Signed)
[facility] Provider Note  CSN: [id number] Arrival date & time: 07/23/20 1748    History Chief Complaint  Patient presents with  . Weakness    HPI  [name] [name] is a 70 y.o. female with history of complex cardiovascular history, see summary from recent visit with Dr. Haroldine Laws below. She reports 2 days ago she began to have numbness and a heavy feeling in her L arm and leg. Not really weak but she was having trouble feeling it. Today, about 5pm while riding in the car with her husband began to have garbled speech and unable to move L arm or leg at all. She was unable to stand and pivot to get out of the car on arrival here but symptoms have gradually improved in the meantime. She still reports some L sided weakness but speech has cleared. She is on Xarelto for prior clots.   From Dr. 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 Johnsie Cancel for further evalaution of her progressive cardiomyopathy.   Echo 2015 found to have EF 45% with inferior wall and apical HK - she did not want to have heart cath. Myoview done at that time showed inferior/ apical scar with no ischemia She was not on beta blocker due to bradycardia and first degree block at baseline.   Carotid duplex 07/19/15 40-59% RICA stenosisrepeat 01/13/20 plaque no stenosis with new lab criteria Echo 12/03/16 EF 40-45% AV sclerosis mean gradient 6 mmHg  She saw Dr. Johnsie Cancel recently in 10/21 and refused cath again. Echo performed 11/21 to reassess EF and EF 20-25% mild AS mean gradient 10. 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 Deferred any venography intervention"   Past Medical History:  Diagnosis Date  . Abnormal  LFTs 2013  . Allergic rhinosinusitis   . Anemia of chronic disease   . At risk for dental problems   . Atypical nevus   . Bacteremia due to Klebsiella pneumoniae 12/12/2011  . Bacterial overgrowth syndrome   . 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 Carotid stenosis    Carotid US (9/15):  R 40-59%; L 1-39% >> FU 1 year  . Closed left subtrochanteric femur fracture ([place]) 12/31/2017  . Congestive heart failure (CHF) ([facility])    EF 25-30%  . 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 Fracture of left clavicle   . History of blood transfusion 2013   anemia  . Hx of cardiovascular stress test    Myoview (9/15):  inf-apical scar; no ischemia; EF 47% - low risk   . 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 Osteoporosis   . Pancytopenia 10/07/2011  . Pathologic fracture of neck of femur ([place])   . Personal history of colonic polyps   . Renal insufficiency    hx of yrs ago  . Serratia marcescens infection - bactermia assoc w/ PICC 01/18/2015  . 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 Splenomegaly    By ultrasound  . Thrombophilia ([place])   . Vitamin D deficiency     Past Surgical History:  Procedure Laterality Date  . APPENDECTOMY  yrs ago  .  CHOLECYSTECTOMY  yrs ago  . COLONOSCOPY  12/05/2005   internal hemorrhoids (for polyp surveillance)  . COLONOSCOPY  04/26/2012   Procedure: COLONOSCOPY;  Surgeon: Gatha Mayer, MD;  Location: [facility];  Service: Endoscopy;  Laterality: N/A;  . COLONOSCOPY WITH PROPOFOL N/A 03/18/2016   Procedure: COLONOSCOPY WITH PROPOFOL;  Surgeon: Gatha Mayer, MD;  Location: [facility];  Service: Endoscopy;  Laterality: N/A;  . ESOPHAGOGASTRODUODENOSCOPY  01/22/2009   erosive esophagitis  . HARDWARE REMOVAL Left 12/31/2017   Procedure: HARDWARE REMOVAL;  Surgeon: Rod Can, MD;  Location: [facility];  Service: Orthopedics;  Laterality: Left;  .  INTRAMEDULLARY (IM) NAIL INTERTROCHANTERIC Left 12/31/2017   Procedure: INTRAMEDULLARY (IM) NAIL SUBTROCHANTRIC;  Surgeon: Rod Can, MD;  Location: [facility];  Service: Orthopedics;  Laterality: Left;  . IR CV LINE INJECTION  03/23/2018  . IR FLUORO GUIDE CV LINE LEFT  01/01/2018  . IR FLUORO GUIDE CV LINE LEFT  03/24/2018  . IR FLUORO GUIDE CV LINE LEFT  05/21/2018  . IR FLUORO GUIDE CV LINE LEFT  07/09/2018  . IR GENERIC HISTORICAL  07/04/2016   IR REMOVAL TUN CV CATH W/O FL 07/04/2016 Ascencion Dike, PA-C [facility]  . IR GENERIC HISTORICAL  07/10/2016   IR US GUIDE VASC ACCESS LEFT 07/10/2016 Arne Cleveland, MD [facility]  . IR GENERIC HISTORICAL  07/10/2016   IR FLUORO GUIDE CV LINE LEFT 07/10/2016 Arne Cleveland, MD [facility]  . IR PATIENT EVAL TECH 0-60 MINS  10/07/2017  . IR PATIENT EVAL TECH 0-60 MINS  03/09/2018  . IR RADIOLOGIST EVAL & MGMT  07/21/2017  . LEFT HEART CATH AND CORONARY ANGIOGRAPHY N/A 06/26/2020   Procedure: LEFT HEART CATH AND CORONARY ANGIOGRAPHY;  Surgeon: Jolaine Artist, MD;  Location: [facility];  Service: Cardiovascular;  Laterality: N/A;  . ORIF PROXIMAL FEMORAL FRACTURE W/ ITST NAIL SYSTEM  03/2007   left, Dr. Shellia Carwin  . SMALL INTESTINE SURGERY  2005   multiple with right colon resection for ischemia/infarct    Family History  Problem Relation Age of Onset  . Diabetes Mother   . Hypertension Mother   . AAA (abdominal aortic aneurysm) Mother   . Colon cancer Neg Hx   . Stomach cancer Neg Hx     Social History   Tobacco Use  . Smoking status: Former Smoker    Types: Cigarettes    Quit date: 05/12/2004    Years since quitting: 16.2  . Smokeless tobacco: Never Used  Vaping Use  . Vaping Use: Never used  Substance Use Topics  . Alcohol use: No  . Drug use: No     Home Medications Prior to Admission medications   Medication Sig Start Date End Date Taking? Authorizing Provider  acetaminophen (TYLENOL) 500 MG tablet Take 1,000 mg  by mouth daily as needed for mild pain.    [provider]  ADULT TPN Inject 1,800 mLs into the vein 4 (four) times a week. Pt receives home TPN from [facility]:  1800 mL bag, four nights weekly (Monday, Tuesday, Wednesday, Thursday for 8 hours (includes 1 hour taper up and down).    [provider]  Calcium Carb-Cholecalciferol (CALCIUM + D3 PO) Take 2 tablets by mouth daily.    [provider]  cetirizine (ZYRTEC) 10 MG tablet Take 1 tablet (10 mg total) by mouth daily. Patient taking differently: Take 10 mg by mouth every evening. 12/02/19   [name], MD  clobetasol ointment (TEMOVATE)  3.29 % Apply 1 application topically 2 (two) times daily. Patient taking differently: Apply 1 application topically 2 (two) times daily as needed (skin irritation). 12/23/18   Thayer Headings, MD  denosumab (PROLIA) 60 MG/ML SOSY injection Inject 60 mg into the skin every 6 (six) months. Patient not taking: Reported on 07/04/2020    [name], MD  diphenhydrAMINE (BENADRYL) 25 mg capsule Take 25-50 mg by mouth every 6 (six) hours as needed for itching or sleep.     [provider]  fluticasone (FLONASE) 50 MCG/ACT nasal spray SPRAY 2 SPRAYS INTO EACH NOSTRIL EVERY DAY Patient taking differently: Place 1-2 sprays into both nostrils daily as needed for allergies. 05/31/20   [name], MD  furosemide (LASIX) 20 MG tablet Take one tablet by mouth as needed for swelling Patient taking differently: Take 20 mg by mouth daily as needed (swelling). 05/09/20   Josue Hector, MD  Heparin Sodium, Porcine, (HEPARIN LOCK FLUSH IJ) Inject 5 mLs as directed 4 (four) times a week. Monday, Tuesday, Wednesday, Thursday in the morning with TPN    [provider]  Multiple Vitamins-Minerals (ADULT GUMMY PO) Take 2 tablets by mouth daily.    [provider]  omeprazole (PRILOSEC) 40 MG capsule TAKE 1 CAPSULE 2 (TWO) TIMES DAILY BY MOUTH. TAKE BEFORE  BREAKFAST AND SUPPER Patient taking differently: Take 40 mg by mouth daily before breakfast. TAKE 1 CAPSULE 2 (TWO) TIMES DAILY BY MOUTH. TAKE BEFORE BREAKFAST AND SUPPER 05/17/20   Gatha Mayer, MD  ondansetron (ZOFRAN-ODT) 8 MG disintegrating tablet TAKE 1 TABLET (8 MG TOTAL) BY MOUTH EVERY 8 (EIGHT) HOURS AS NEEDED FOR NAUSEA OR VOMITING. 12/05/19   Gatha Mayer, MD  sacubitril-valsartan (ENTRESTO) 24-26 MG Take 1 tablet by mouth 2 (two) times daily. 05/09/20   Josue Hector, MD  sodium chloride 0.9 % infusion Inject 900 mLs into the vein every 14 (fourteen) days. Uses for TPN 10/27/17   [provider]  spironolactone (ALDACTONE) 25 MG tablet Take 1 tablet (25 mg total) by mouth daily. 07/04/20   Bensimhon, Shaune Pascal, MD  Teduglutide, rDNA, (GATTEX) 5 MG KIT Inject 3.3 Units into the skin daily. Patient taking differently: Inject 3.1 Units into the skin daily. 09/11/14   Gatha Mayer, MD  vitamin E 1000 UNIT capsule Take 1,000 Units by mouth daily. Patient not taking: Reported on 07/04/2020    [provider]  XARELTO 20 MG TABS tablet TAKE 1 TABLET (20 MG TOTAL) BY MOUTH DAILY WITH SUPPER. Patient taking differently: Take 20 mg by mouth daily with supper. 02/17/20   [name], MD     Allergies    Penicillins   Review of Systems   Review of Systems A comprehensive review of systems was completed and negative except as noted in HPI.    Physical Exam BP (!) 94/50 (BP Location: Right Arm)   Pulse 63   Temp 98.6 F (37 C) (Oral)   Resp 16   Ht $R'5\' 8"'RH$  (1.727 m)   Wt 64.4 kg   LMP 02/26/2014   SpO2 100%   BMI 21.59 kg/m   Physical Exam Vitals and nursing note reviewed.  Constitutional:      Appearance: Normal appearance.  HENT:     Head: Normocephalic and atraumatic.     Nose: Nose normal.     Mouth/Throat:     Mouth: Mucous membranes are moist.  Eyes:     Extraocular Movements: Extraocular movements  intact.     Conjunctiva/sclera:  Conjunctivae normal.  Cardiovascular:     Rate and Rhythm: Normal rate.  Pulmonary:     Effort: Pulmonary effort is normal.     Breath sounds: Normal breath sounds.     Comments: Central line in L upper chest Abdominal:     General: Abdomen is flat.     Palpations: Abdomen is soft.     Tenderness: There is no abdominal tenderness.  Musculoskeletal:        General: No swelling. Normal range of motion.     Cervical back: Neck supple.  Skin:    General: Skin is warm and dry.  Neurological:     Mental Status: She is alert and oriented to person, place, and time.     Cranial Nerves: No cranial nerve deficit.     Sensory: No sensory deficit (sensation grossly intact to light touch).     Motor: Weakness (LUE and LLE 4/5, RUE and RLE 5/5) present.     Coordination: Coordination normal.     Comments: No visual field cuts, speech is clear  Psychiatric:        Mood and Affect: Mood normal.      ED Results / Procedures / Treatments   Labs (all labs ordered are listed, but only abnormal results are displayed) Labs Reviewed  PROTIME-INR - Abnormal; Notable for the following components:      Result Value   Prothrombin Time 19.9 (*)    INR 1.8 (*)    All other components within normal limits  CBC - Abnormal; Notable for the following components:   WBC 10.7 (*)    RBC 2.93 (*)    Hemoglobin 9.3 (*)    HCT 29.3 (*)    Platelets 115 (*)    All other components within normal limits  DIFFERENTIAL - Abnormal; Notable for the following components:   Neutro Abs 9.0 (*)    All other components within normal limits  COMPREHENSIVE METABOLIC PANEL - Abnormal; Notable for the following components:   Sodium 133 (*)    Potassium 3.0 (*)    Chloride 112 (*)    CO2 14 (*)    Creatinine, Ser 1.26 (*)    Calcium 7.9 (*)    Albumin 2.9 (*)    GFR, Estimated 46 (*)    All other components within normal limits  RESP PANEL BY RT-PCR (FLU A&B, COVID) ARPGX2  APTT  MAGNESIUM  URINALYSIS, ROUTINE  W REFLEX MICROSCOPIC    EKG None   Radiology No results found.  Procedures Procedures  Medications Ordered in the ED Medications - No data to display   MDM Rules/Calculators/A&P MDM Patient with symptoms concerning for stroke, last known normal though was 2 days ago, although symptoms briefly worsened just prior to arrival. She is also on Xarelto so not a candidate for TPA. Spoke briefly with Dr. Leonel Ramsay, Neuro who agrees does not need Code Stroke activation but recommends neurovascular imaging and admission for further workup. Stroke order panel initiated.  ED Course  I have reviewed the triage vital signs and the nursing notes.  Pertinent labs & imaging results that were available during my care of the patient were reviewed by me and considered in my medical decision making (see chart for details).  Clinical Course as of 07/23/20 2253  Mon Jul 23, 2020  1903 Patient states BP runs low due to heart medications and short gut syndrome. Will give a bolus of fluids and continue to monitor.  [  CS]  1929 CT images and results reviewed, will send for CTA head/neck.  [CS]  1953 INR mildly elevated.  [CS]  2001 CMP with mild hypokalemia, and elevated Cr above baseline. Low albumin consistent with her history of short gut.  [CS]  2011 CBC with anemia at baseline. Mild leukocytosis of unclear significance. No infectious symptoms.  [CS]  2139 Spoke with Dr. Marlyce Huge, Hospitalist, who will accept for admission.  [CS]    Clinical Course User Index [CS] Truddie Hidden, MD    Final Clinical Impression(s) / ED Diagnoses Final diagnoses:  Cerebrovascular accident (CVA), unspecified mechanism [facility])    Rx / DC Orders ED Discharge Orders    None       Truddie Hidden, MD 07/23/20 2253

## 2020-07-23 NOTE — ED Notes (Signed)
Patient transported to CT 

## 2020-07-24 ENCOUNTER — Encounter (HOSPITAL_COMMUNITY): Payer: Self-pay | Admitting: Internal Medicine

## 2020-07-24 ENCOUNTER — Observation Stay (HOSPITAL_COMMUNITY): Payer: Medicare Other

## 2020-07-24 DIAGNOSIS — I130 Hypertensive heart and chronic kidney disease with heart failure and stage 1 through stage 4 chronic kidney disease, or unspecified chronic kidney disease: Secondary | ICD-10-CM | POA: Diagnosis not present

## 2020-07-24 DIAGNOSIS — E559 Vitamin D deficiency, unspecified: Secondary | ICD-10-CM | POA: Diagnosis not present

## 2020-07-24 DIAGNOSIS — D638 Anemia in other chronic diseases classified elsewhere: Secondary | ICD-10-CM | POA: Diagnosis not present

## 2020-07-24 DIAGNOSIS — Z79899 Other long term (current) drug therapy: Secondary | ICD-10-CM | POA: Diagnosis not present

## 2020-07-24 DIAGNOSIS — R29702 NIHSS score 2: Secondary | ICD-10-CM | POA: Diagnosis not present

## 2020-07-24 DIAGNOSIS — N1831 Chronic kidney disease, stage 3a: Secondary | ICD-10-CM

## 2020-07-24 DIAGNOSIS — R29703 NIHSS score 3: Secondary | ICD-10-CM | POA: Diagnosis not present

## 2020-07-24 DIAGNOSIS — I6389 Other cerebral infarction: Secondary | ICD-10-CM

## 2020-07-24 DIAGNOSIS — I6782 Cerebral ischemia: Secondary | ICD-10-CM | POA: Diagnosis not present

## 2020-07-24 DIAGNOSIS — Z20822 Contact with and (suspected) exposure to covid-19: Secondary | ICD-10-CM | POA: Diagnosis not present

## 2020-07-24 DIAGNOSIS — E7140 Disorder of carnitine metabolism, unspecified: Secondary | ICD-10-CM | POA: Diagnosis not present

## 2020-07-24 DIAGNOSIS — Z8249 Family history of ischemic heart disease and other diseases of the circulatory system: Secondary | ICD-10-CM | POA: Diagnosis not present

## 2020-07-24 DIAGNOSIS — Z7901 Long term (current) use of anticoagulants: Secondary | ICD-10-CM | POA: Diagnosis not present

## 2020-07-24 DIAGNOSIS — M810 Age-related osteoporosis without current pathological fracture: Secondary | ICD-10-CM | POA: Diagnosis not present

## 2020-07-24 DIAGNOSIS — I5042 Chronic combined systolic (congestive) and diastolic (congestive) heart failure: Secondary | ICD-10-CM | POA: Diagnosis not present

## 2020-07-24 DIAGNOSIS — I6300 Cerebral infarction due to thrombosis of unspecified precerebral artery: Secondary | ICD-10-CM | POA: Diagnosis not present

## 2020-07-24 DIAGNOSIS — N183 Chronic kidney disease, stage 3 unspecified: Secondary | ICD-10-CM

## 2020-07-24 DIAGNOSIS — I639 Cerebral infarction, unspecified: Secondary | ICD-10-CM

## 2020-07-24 DIAGNOSIS — G319 Degenerative disease of nervous system, unspecified: Secondary | ICD-10-CM | POA: Diagnosis not present

## 2020-07-24 DIAGNOSIS — R29818 Other symptoms and signs involving the nervous system: Secondary | ICD-10-CM | POA: Diagnosis not present

## 2020-07-24 DIAGNOSIS — D696 Thrombocytopenia, unspecified: Secondary | ICD-10-CM | POA: Diagnosis not present

## 2020-07-24 DIAGNOSIS — G8194 Hemiplegia, unspecified affecting left nondominant side: Secondary | ICD-10-CM | POA: Diagnosis not present

## 2020-07-24 DIAGNOSIS — E162 Hypoglycemia, unspecified: Secondary | ICD-10-CM | POA: Diagnosis not present

## 2020-07-24 DIAGNOSIS — Z86010 Personal history of colonic polyps: Secondary | ICD-10-CM | POA: Diagnosis not present

## 2020-07-24 DIAGNOSIS — L89151 Pressure ulcer of sacral region, stage 1: Secondary | ICD-10-CM | POA: Diagnosis not present

## 2020-07-24 DIAGNOSIS — K912 Postsurgical malabsorption, not elsewhere classified: Secondary | ICD-10-CM | POA: Diagnosis not present

## 2020-07-24 DIAGNOSIS — Z87891 Personal history of nicotine dependence: Secondary | ICD-10-CM | POA: Diagnosis not present

## 2020-07-24 DIAGNOSIS — Z86718 Personal history of other venous thrombosis and embolism: Secondary | ICD-10-CM | POA: Diagnosis not present

## 2020-07-24 DIAGNOSIS — Z8673 Personal history of transient ischemic attack (TIA), and cerebral infarction without residual deficits: Secondary | ICD-10-CM

## 2020-07-24 DIAGNOSIS — I616 Nontraumatic intracerebral hemorrhage, multiple localized: Secondary | ICD-10-CM | POA: Diagnosis not present

## 2020-07-24 DIAGNOSIS — D6859 Other primary thrombophilia: Secondary | ICD-10-CM | POA: Diagnosis not present

## 2020-07-24 DIAGNOSIS — L8990 Pressure ulcer of unspecified site, unspecified stage: Secondary | ICD-10-CM | POA: Insufficient documentation

## 2020-07-24 DIAGNOSIS — Z880 Allergy status to penicillin: Secondary | ICD-10-CM | POA: Diagnosis not present

## 2020-07-24 LAB — GLUCOSE, CAPILLARY
Glucose-Capillary: 64 mg/dL — ABNORMAL LOW (ref 70–99)
Glucose-Capillary: 72 mg/dL (ref 70–99)
Glucose-Capillary: 70 mg/dL (ref 70–99)
Glucose-Capillary: 61 mg/dL — ABNORMAL LOW (ref 70–99)
Glucose-Capillary: 64 mg/dL — ABNORMAL LOW (ref 70–99)
Glucose-Capillary: 62 mg/dL — ABNORMAL LOW (ref 70–99)

## 2020-07-24 LAB — LIPID PANEL
Cholesterol: 72 mg/dL (ref 0–200)
HDL: 17 mg/dL — ABNORMAL LOW (ref >40)
LDL Cholesterol: 20 mg/dL (ref 0–99)
Total CHOL/HDL Ratio: 4.2 RATIO
Triglycerides: 175 mg/dL — ABNORMAL HIGH (ref <150)
VLDL: 35 mg/dL (ref 0–40)

## 2020-07-24 LAB — COMPREHENSIVE METABOLIC PANEL
ALT: 15 U/L (ref 0–44)
AST: 16 U/L (ref 15–41)
Albumin: 2.5 g/dL — ABNORMAL LOW (ref 3.5–5.0)
Alkaline Phosphatase: 91 U/L (ref 38–126)
Anion gap: 6 (ref 5–15)
BUN: 10 mg/dL (ref 8–23)
CO2: 14 mmol/L — ABNORMAL LOW (ref 22–32)
Calcium: 8 mg/dL — ABNORMAL LOW (ref 8.9–10.3)
Chloride: 115 mmol/L — ABNORMAL HIGH (ref 98–111)
Creatinine, Ser: 1.32 mg/dL — ABNORMAL HIGH (ref 0.44–1.00)
GFR, Estimated: 43 mL/min — ABNORMAL LOW (ref >60)
Glucose, Bld: 83 mg/dL (ref 70–99)
Potassium: 3 mmol/L — ABNORMAL LOW (ref 3.5–5.1)
Sodium: 135 mmol/L (ref 135–145)
Total Bilirubin: 0.8 mg/dL (ref 0.3–1.2)
Total Protein: 6.8 g/dL (ref 6.5–8.1)

## 2020-07-24 LAB — HEMOGLOBIN A1C
Hgb A1c MFr Bld: 5.4 % (ref 4.8–5.6)
Mean Plasma Glucose: 108.28 mg/dL

## 2020-07-24 LAB — HIV ANTIBODY (ROUTINE TESTING W REFLEX): HIV Screen 4th Generation wRfx: NONREACTIVE

## 2020-07-24 MED ORDER — SODIUM CHLORIDE 0.9 % IV BOLUS
1000.0000 mL | Freq: Once | INTRAVENOUS | Status: AC
  Administered 2020-07-24: 1000 mL via INTRAVENOUS

## 2020-07-24 MED ORDER — ACETAMINOPHEN 325 MG PO TABS
650.0000 mg | ORAL_TABLET | ORAL | Status: DC | PRN
  Administered 2020-07-24: 650 mg via ORAL
  Filled 2020-07-24: qty 2

## 2020-07-24 MED ORDER — ACETAMINOPHEN 325 MG PO TABS: 650.0000 mg | ORAL_TABLET | Freq: Four times a day (QID) | ORAL | Status: DC | PRN

## 2020-07-24 MED ORDER — PANTOPRAZOLE SODIUM 40 MG PO TBEC
40.0000 mg | DELAYED_RELEASE_TABLET | Freq: Every day | ORAL | Status: DC
  Administered 2020-07-24 – 2020-07-26 (×3): 40 mg via ORAL
  Filled 2020-07-24 (×3): qty 1

## 2020-07-24 MED ORDER — STROKE: EARLY STAGES OF RECOVERY BOOK
Freq: Once | Status: AC
  Filled 2020-07-24: qty 1

## 2020-07-24 MED ORDER — DIPHENOXYLATE-ATROPINE 2.5-0.025 MG PO TABS: 1.0000 | ORAL_TABLET | Freq: Four times a day (QID) | ORAL | Status: DC | PRN

## 2020-07-24 MED ORDER — PERFLUTREN LIPID MICROSPHERE
1.0000 mL | INTRAVENOUS | Status: AC | PRN
  Administered 2020-07-24: 2 mL via INTRAVENOUS
  Filled 2020-07-24: qty 10

## 2020-07-24 MED ORDER — FUROSEMIDE 20 MG PO TABS: 20.0000 mg | ORAL_TABLET | Freq: Every day | ORAL | Status: DC | PRN

## 2020-07-24 MED ORDER — RIVAROXABAN 20 MG PO TABS
20.0000 mg | ORAL_TABLET | Freq: Every day | ORAL | Status: DC
  Administered 2020-07-24 – 2020-07-25 (×2): 20 mg via ORAL
  Filled 2020-07-24 (×2): qty 1

## 2020-07-24 MED ORDER — ACETAMINOPHEN 650 MG RE SUPP: 650.0000 mg | RECTAL | Status: DC | PRN

## 2020-07-24 MED ORDER — ONDANSETRON HCL 4 MG/2ML IJ SOLN
4.0000 mg | Freq: Four times a day (QID) | INTRAMUSCULAR | Status: DC | PRN
  Administered 2020-07-24: 4 mg via INTRAVENOUS
  Filled 2020-07-24: qty 2

## 2020-07-24 MED ORDER — POTASSIUM CHLORIDE CRYS ER 20 MEQ PO TBCR
40.0000 meq | EXTENDED_RELEASE_TABLET | Freq: Two times a day (BID) | ORAL | Status: AC
  Administered 2020-07-24 – 2020-07-25 (×4): 40 meq via ORAL
  Filled 2020-07-24 (×4): qty 2

## 2020-07-24 MED ORDER — DEXTROSE 5 % IV BOLUS: 500.0000 mL | Freq: Once | INTRAVENOUS | Status: DC

## 2020-07-24 MED ORDER — DEXTROSE-NACL 5-0.45 % IV SOLN: INTRAVENOUS | Status: DC

## 2020-07-24 MED ORDER — ACETAMINOPHEN 160 MG/5ML PO SOLN: 650.0000 mg | ORAL | Status: DC | PRN

## 2020-07-24 MED ORDER — SACUBITRIL-VALSARTAN 24-26 MG PO TABS
1.0000 | ORAL_TABLET | Freq: Two times a day (BID) | ORAL | Status: DC
  Administered 2020-07-24 – 2020-07-26 (×5): 1 via ORAL
  Filled 2020-07-24 (×6): qty 1

## 2020-07-24 MED ORDER — SPIRONOLACTONE 25 MG PO TABS
25.0000 mg | ORAL_TABLET | Freq: Every day | ORAL | Status: DC
  Administered 2020-07-24 – 2020-07-26 (×3): 25 mg via ORAL
  Filled 2020-07-24 (×3): qty 1

## 2020-07-24 MED ORDER — ACETAMINOPHEN 325 MG PO TABS
650.0000 mg | ORAL_TABLET | ORAL | Status: DC | PRN
  Administered 2020-07-25 – 2020-07-26 (×3): 650 mg via ORAL
  Filled 2020-07-24 (×3): qty 2

## 2020-07-24 MED ORDER — MAGNESIUM SULFATE 2 GM/50ML IV SOLN
2.0000 g | Freq: Once | INTRAVENOUS | Status: AC
  Administered 2020-07-24: 2 g via INTRAVENOUS
  Filled 2020-07-24: qty 50

## 2020-07-24 MED ORDER — ATORVASTATIN CALCIUM 80 MG PO TABS
80.0000 mg | ORAL_TABLET | Freq: Every day | ORAL | Status: DC
  Administered 2020-07-24 – 2020-07-26 (×3): 80 mg via ORAL
  Filled 2020-07-24 (×3): qty 1

## 2020-07-24 NOTE — Progress Notes (Signed)
Arrived to rm [id number]. Denies pain, dizziness, shortness of breath. Neuro check completed. Alert/oriented. Speech clear. See flowsheet for full documentation.  Bedside swallow completed and she passed w/out any difficulties. No s/s of aspiration risks noted.  Neurologist at bedside to assess.

## 2020-07-24 NOTE — Consult Note (Signed)
NEUROLOGY CONSULTATION NOTE   Date of service: July 24, 2020 Patient Name: [name] [name] MRN:  [id number] DOB:  10/01/1950 Reason for consult: "Stroke" _ _ _   _ __   _ __ _ _  __ __   _ __   __ _  History of Present Illness  [name] [name] is a 70 y.o. female with PMH significant for anemia of chronic disease, pancytopenia, thrombophilia, pancytopenia, carotid stenosis, short bowel syndrome, DVT on Xarelto who presents with L arm and leg numbness and heaviness.  Reports that on Saturday 3/12, she noticed numbness in L arm and heaviness along with heaviness in her left foot. Did not think too much of it. Stomped her leg a litte on the floor and felt better so did not seek attention. On Monday, she noticed that her left hand was much more clumsy. She went later in the day to take her dog to veterinarian and noted that her left hand was floppy and her left leg would not work and had trouble getting out of the car. She came to the ED this time. Reports that it got much better but left arm feels numb and left leg is still heavy.  Workup in the ED with [facility] which was negative for a large hypodensity concerning for a large territory infarct or hyperdensity concerning for an ICH. Notable for advanced microvascular disease. CT Angio head and neck with no LVO. R ICA 70% stenosis at the origin/bulb and 50% stenosis at the L carotid bulb along with a 94mm L MCA bifurcation aneurysm.  She was transferred to [facility] for further workup. Takes Xarelto and took her last dose on Sunday night. No family hx of strokes. Does not smoke.  LKW: 07/21/20 at 0900 MRS: 1 tPA and thrombectomy: outside the window NIHSS: 1 for mild LUE drift.   ROS   Constitutional Denies weight loss, fever and chills.   HEENT Denies changes in vision and hearing.   Respiratory Denies SOB and cough.   CV Denies palpitations and CP   GI Denies abdominal pain, nausea, vomiting and diarrhea.   GU Denies dysuria and urinary frequency.  MSK  Denies myalgia and joint pain.   Skin Denies rash and pruritus.   Neurological Denies headache and syncope.   Psychiatric Denies recent changes in mood. Denies anxiety and depression.    Past History   Past Medical History:  Diagnosis Date  . Abnormal LFTs 2013  . Allergic rhinosinusitis   . Anemia of chronic disease   . At risk for dental problems   . Atypical nevus   . Bacteremia due to Klebsiella pneumoniae 12/12/2011  . Bacterial overgrowth syndrome   . 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 Carotid stenosis    Carotid US (9/15):  R 40-59%; L 1-39% >> FU 1 year  . Closed left subtrochanteric femur fracture ([place]) 12/31/2017  . Congestive heart failure (CHF) (HCC)    EF 25-30%  . 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 Fracture of left clavicle   . History of blood transfusion 2013   anemia  . Hx of cardiovascular stress test    Myoview (9/15):  inf-apical scar; no ischemia; EF 47% - low risk   . 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 Osteoporosis   . Pancytopenia 10/07/2011  . Pathologic fracture of neck of femur ([place])   . Personal history of colonic polyps   . Renal  insufficiency    hx of yrs ago  . Serratia marcescens infection - bactermia assoc w/ PICC 01/18/2015  . 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 Splenomegaly    By ultrasound  . Thrombophilia ([place])   . Vitamin D deficiency    Past Surgical History:  Procedure Laterality Date  . APPENDECTOMY  yrs ago  . CHOLECYSTECTOMY  yrs ago  . COLONOSCOPY  12/05/2005   internal hemorrhoids (for polyp surveillance)  . COLONOSCOPY  04/26/2012   Procedure: COLONOSCOPY;  Surgeon: Gatha Mayer, MD;  Location: [facility];  Service: Endoscopy;  Laterality: N/A;  . COLONOSCOPY WITH PROPOFOL N/A 03/18/2016   Procedure: COLONOSCOPY WITH PROPOFOL;  Surgeon: Gatha Mayer, MD;  Location: [facility];  Service:  Endoscopy;  Laterality: N/A;  . ESOPHAGOGASTRODUODENOSCOPY  01/22/2009   erosive esophagitis  . HARDWARE REMOVAL Left 12/31/2017   Procedure: HARDWARE REMOVAL;  Surgeon: Rod Can, MD;  Location: [facility] ORS;  Service: Orthopedics;  Laterality: Left;  . INTRAMEDULLARY (IM) NAIL INTERTROCHANTERIC Left 12/31/2017   Procedure: INTRAMEDULLARY (IM) NAIL SUBTROCHANTRIC;  Surgeon: Rod Can, MD;  Location: [facility];  Service: Orthopedics;  Laterality: Left;  . IR CV LINE INJECTION  03/23/2018  . IR FLUORO GUIDE CV LINE LEFT  01/01/2018  . IR FLUORO GUIDE CV LINE LEFT  03/24/2018  . IR FLUORO GUIDE CV LINE LEFT  05/21/2018  . IR FLUORO GUIDE CV LINE LEFT  07/09/2018  . IR GENERIC HISTORICAL  07/04/2016   IR REMOVAL TUN CV CATH W/O FL 07/04/2016 Ascencion Dike, PA-C [facility]  . IR GENERIC HISTORICAL  07/10/2016   IR US GUIDE VASC ACCESS LEFT 07/10/2016 Arne Cleveland, MD [facility]  . IR GENERIC HISTORICAL  07/10/2016   IR FLUORO GUIDE CV LINE LEFT 07/10/2016 Arne Cleveland, MD [facility]  . IR PATIENT EVAL TECH 0-60 MINS  10/07/2017  . IR PATIENT EVAL TECH 0-60 MINS  03/09/2018  . IR RADIOLOGIST EVAL & MGMT  07/21/2017  . LEFT HEART CATH AND CORONARY ANGIOGRAPHY N/A 06/26/2020   Procedure: LEFT HEART CATH AND CORONARY ANGIOGRAPHY;  Surgeon: Jolaine Artist, MD;  Location: [place] [facility];  Service: Cardiovascular;  Laterality: N/A;  . ORIF PROXIMAL FEMORAL FRACTURE W/ ITST NAIL SYSTEM  03/2007   left, Dr. Shellia Carwin  . SMALL INTESTINE SURGERY  2005   multiple with right colon resection for ischemia/infarct   Family History  Problem Relation Age of Onset  . Diabetes Mother   . Hypertension Mother   . AAA (abdominal aortic aneurysm) Mother   . Colon cancer Neg Hx   . Stomach cancer Neg Hx    Social History   Socioeconomic History  . Marital status: Married    Spouse name: Not on file  . Number of children: Not on file  . Years of education: Not on file  . Highest education level:  Not on file  Occupational History  . Not on file  Tobacco Use  . Smoking status: Former Smoker    Types: Cigarettes    Quit date: 05/12/2004    Years since quitting: 16.2  . Smokeless tobacco: Never Used  Vaping Use  . Vaping Use: Never used  Substance and Sexual Activity  . Alcohol use: No  . Drug use: No  . Sexual activity: Yes    Comment: 1st intercourse 32 yo-5 partners  Other Topics Concern  . 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 Lack of Transportation (Medical): No  . Lack of Transportation (Non-Medical): No  Physical Activity: Not on file  Stress: Not on file  Social Connections: Not on file   Allergies  Allergen Reactions  . Penicillins Itching, Swelling and Other (See Comments)    Reaction:  Facial swelling Has patient had a PCN reaction causing immediate rash, facial/tongue/throat swelling, SOB or lightheadedness with hypotension: Yes Has patient had a PCN reaction causing severe rash involving mucus membranes or skin necrosis: No Has patient had a PCN reaction that required hospitalization No Has patient had a PCN reaction occurring within the last 10 years: No If all of the above answers are "NO", then may proceed with Cephalosporin use.    Medications   Medications Prior to Admission  Medication Sig Dispense Refill Last Dose  . acetaminophen (TYLENOL) 500 MG tablet Take 1,000 mg by mouth daily as needed for mild pain.     . ADULT TPN Inject 1,800 mLs into the vein 4 (four) times a week. Pt receives home TPN from [facility]:  1800 mL bag, four nights weekly  (Monday, Tuesday, Wednesday, Thursday for 8 hours (includes 1 hour taper up and down).     . Calcium Carb-Cholecalciferol (CALCIUM + D3 PO) Take 2 tablets by mouth daily.     . cetirizine (ZYRTEC) 10 MG tablet Take 1 tablet (10 mg total) by mouth daily. (Patient taking differently: Take 10 mg by mouth every evening.) 30 tablet 11   . clobetasol ointment (TEMOVATE) 7.94 % Apply 1 application topically 2 (two) times daily. (Patient taking differently: Apply 1 application topically 2 (two) times daily as needed (skin irritation).) 30 g 0   . denosumab (PROLIA) 60 MG/ML SOSY injection Inject 60 mg into the skin every 6 (six) months. (Patient not taking: Reported on 07/04/2020)     . diphenhydrAMINE (BENADRYL) 25 mg capsule Take 25-50 mg by mouth every 6 (six) hours as needed for itching or sleep.      . fluticasone (FLONASE) 50 MCG/ACT nasal spray SPRAY 2 SPRAYS INTO EACH NOSTRIL EVERY DAY (Patient taking differently: Place 1-2 sprays into both nostrils daily as needed for allergies.) 48 mL 2   . furosemide (LASIX) 20 MG tablet Take one tablet by mouth as needed for swelling (Patient taking differently: Take 20 mg by mouth daily as needed (swelling).) 30 tablet 3   . Heparin Sodium, Porcine, (HEPARIN LOCK FLUSH IJ) Inject 5 mLs as directed 4 (four) times a week. Monday, Tuesday, Wednesday, Thursday in the morning with TPN     . Multiple Vitamins-Minerals (ADULT GUMMY PO) Take 2 tablets by mouth daily.     Marland Kitchen omeprazole (PRILOSEC) 40 MG capsule TAKE 1 CAPSULE 2 (TWO) TIMES DAILY BY MOUTH. TAKE BEFORE BREAKFAST AND SUPPER (Patient taking differently: Take 40 mg by mouth daily before breakfast. TAKE 1 CAPSULE 2 (TWO) TIMES DAILY BY MOUTH. TAKE BEFORE BREAKFAST AND SUPPER) 180 capsule 1   . ondansetron (ZOFRAN-ODT) 8 MG disintegrating tablet TAKE 1 TABLET (8 MG TOTAL) BY MOUTH EVERY 8 (EIGHT) HOURS  AS NEEDED FOR NAUSEA OR VOMITING. 30 tablet 11   . sacubitril-valsartan (ENTRESTO) 24-26 MG Take 1 tablet by mouth 2  (two) times daily. 60 tablet 11   . sodium chloride 0.9 % infusion Inject 900 mLs into the vein every 14 (fourteen) days. Uses for TPN     . spironolactone (ALDACTONE) 25 MG tablet Take 1 tablet (25 mg total) by mouth daily. 30 tablet 11   . Teduglutide, rDNA, (GATTEX) 5 MG KIT Inject 3.3 Units into the skin daily. (Patient taking differently: Inject 3.1 Units into the skin daily.) 1 kit 11   . vitamin E 1000 UNIT capsule Take 1,000 Units by mouth daily. (Patient not taking: Reported on 07/04/2020)     . XARELTO 20 MG TABS tablet TAKE 1 TABLET (20 MG TOTAL) BY MOUTH DAILY WITH SUPPER. (Patient taking differently: Take 20 mg by mouth daily with supper.) 90 tablet 2      Vitals   Vitals:   07/24/20 0030 07/24/20 0100 07/24/20 0230 07/24/20 0354  BP: (!) 82/46 (!) 89/57 (!) 94/48 (!) 109/45  Pulse: (!) 53 (!) 54 (!) 57 (!) 57  Resp: $Remo'14 14  18  'aeHTc$ Temp: 97.9 F (36.6 C)   98 F (36.7 C)  TempSrc: Oral   Oral  SpO2: 98% 95% 100% 98%  Weight:    65.9 kg  Height:    '5\' 8"'$  (1.727 m)     Body mass index is 22.08 kg/m.  Physical Exam   General: Laying comfortably in bed; in no acute distress.  HENT: Normal oropharynx and mucosa. Normal external appearance of ears and nose.  Neck: Supple, no pain or tenderness  CV: No JVD. No peripheral edema.  Pulmonary: Symmetric Chest rise. Normal respiratory effort.  Abdomen: Soft to touch, non-tender.  Ext: No cyanosis, edema, or deformity  Skin: No rash. Normal palpation of skin.   Musculoskeletal: Normal digits and nails by inspection. No clubbing.   Neurologic Examination  Mental status/Cognition: Alert, oriented to self, place, month and year, good attention.  Speech/language: Fluent, comprehension intact, object naming intact, repetition intact.  Cranial nerves:   CN II Pupils equal and reactive to light, no VF deficits    CN III,IV,VI EOM intact, no gaze preference or deviation, no nystagmus    CN V normal sensation in V1, V2, and V3 segments  bilaterally    CN VII no asymmetry, no nasolabial fold flattening    CN VIII normal hearing to speech    CN IX & X normal palatal elevation, no uvular deviation    CN XI 5/5 head turn and 5/5 shoulder shrug bilaterally    CN XII midline tongue protrusion    Motor:  Muscle bulk: poor, tone normal, pronator drift yes LUE tremor none Mvmt Root Nerve  Muscle Right Left Comments  SA C5/6 Ax Deltoid 4+ 4+   EF C5/6 Mc Biceps 5 5   EE C6/7/8 Rad Triceps 5 5   WF C6/7 Med FCR     WE C7/8 PIN ECU     F Ab C8/T1 U ADM/FDI 5 4   HF L1/2/3 Fem Illopsoas 5 4   KE L2/3/4 Fem Quad 5 4+   DF L4/5 D Peron Tib Ant 5 5   PF S1/2 Tibial Grc/Sol 5 5    Reflexes:  Right Left Comments  Pectoralis      Biceps (C5/6)     Brachioradialis (C5/6)      Triceps (C6/7)      Patellar (  L3/4)      Achilles (S1)      Hoffman      Plantar     Jaw jerk    Sensation:  Light touch    Pin prick    Temperature    Vibration   Proprioception    Coordination/Complex Motor:  - Finger to Nose with some past pointing in LUE but no obvious ataxia. - Heel to shin intact - Rapid alternating movement are slowed on the left - Gait: deferred.  Labs   CBC:  Recent Labs  Lab 07/23/20 1920  WBC 10.7*  NEUTROABS 9.0*  HGB 9.3*  HCT 29.3*  MCV 100.0  PLT 115*    Basic Metabolic Panel:  Lab Results  Component Value Date   NA 133 (L) 07/23/2020   K 3.0 (L) 07/23/2020   CO2 14 (L) 07/23/2020   GLUCOSE 81 07/23/2020   BUN 10 07/23/2020   CREATININE 1.26 (H) 07/23/2020   CALCIUM 7.9 (L) 07/23/2020   GFRNONAA 46 (L) 07/23/2020   GFRAA 59 (L) 12/08/2018   Lipid Panel:  Lab Results  Component Value Date   LDLCALC 25 01/09/2014   HgbA1c: No results found for: HGBA1C Urine Drug Screen: No results found for: LABOPIA, COCAINSCRNUR, LABBENZ, AMPHETMU, THCU, LABBARB  Alcohol Level No results found for: [place]  CT Head without contrast: IMPRESSION: No acute intracranial hemorrhage or acute demarcated  cortical infarction.  Patchy and ill-defined hypoattenuation within the bilateral cerebral white matter, progressed in the right centrum semiovale and corona radiata as compared to the prior head CT of 02/28/2010. Findings may reflect progressive chronic small vessel ischemic disease. However, given the provided history consider a brain MRI to exclude an acute white matter infarct at this site.  Minimal paranasal sinus mucosal thickening at the imaged levels.   CT angio Head and Neck with contrast: 1. Negative CTA for large vessel occlusion. 2. 70% atheromatous stenosis at the right carotid bulb/origin of the right ICA. 3. 50% atheromatous stenosis at the left carotid bulb/origin of the left ICA. 4. 5 mm left MCA bifurcation aneurysm.  MRI Brain pending  Impression   [name] [name] is a 70 y.o. female with PMH significant for anemia of chronic disease, pancytopenia, thrombophilia, pancytopenia, carotid stenosis, short bowel syndrome, DVT on Xarelto who presents with L arm and leg numbness and heaviness with a LKW of 07/21/20. Outside tPa and thrombectomy window at presentation. . Her neurologic examination is notable for L hand grip weakness and left hip flexion weakness. Symptoms were abrput onset and concerning for a potential stroke.  Recommendations   - Frequent Neuro checks per stroke unit protocol - Recommend brain imaging with MRI Brain without contrast - Recommend obtaining TTE - Recommend obtaining Lipid panel with LDL - Please start statin if LDL > 70 - Recommend HbA1c - Antithrombotic: None, patient is on Xarelto. - Recommend DVT ppx - SBP goal - permissive hypertension first 24 h < 220/110. Held home meds.  - Recommend Telemetry monitoring for arrythmia - Recommend bedside swallow screen prior to PO intake. - Stroke education booklet - Recommend PT/OT/SLP consult  ______________________________________________________________________   Thank you for the  opportunity to take part in the care of this patient. If you have any further questions, please contact the neurology consultation attending.  Signed,  [place] Pager Number [phone] _ _ _   _ __   _ __ _ _  __ __   _ __   __ _

## 2020-07-24 NOTE — Evaluation (Signed)
Physical Therapy Evaluation Patient Details Name: Jasara C Wrage MRN: [id number] DOB: [birth year] Today's Date: 07/24/2020   History of Present Illness  Celestina Pickron is a 70 yo female who presents with L UE and LE weakness and slurred speech. CT neg, awaiting MRI.  PMH:  hypercoagulable state, history of ischemic gut status post resection, short gut syndrome, anemia of chronic disease, chronic diastolic CHF, carotid stenosis, atherosclerosis.  Clinical Impression  Pt admitted with above diagnosis. Pt presents with much improved speech but continued deficits LUE and LLE with decreased proprioception and poor coordination of movement with functional activities. Pt needed min A for ambulation due to LLE ataxia and decreased balance. Trialed a SPC but pt will need to practice to feel comfortable with this. Pt would benefit from staying overnight and having another session of therapy tomorrow. Recommend Outpatient PT and OT at d/c.  Pt currently with functional limitations due to the deficits listed below (see PT Problem List). Pt will benefit from skilled PT to increase their independence and safety with mobility to allow discharge to the venue listed below.       Follow Up Recommendations Home health PT;Supervision for mobility/OOB    Equipment Recommendations  Cane    Recommendations for Other Services       Precautions / Restrictions Precautions Precautions: Fall Restrictions Weight Bearing Restrictions: No      Mobility  Bed Mobility Overal bed mobility: Modified Independent             General bed mobility comments: no assist needed to come to EOB or return to supine    Transfers Overall transfer level: Needs assistance Equipment used: None Transfers: Sit to/from Stand Sit to Stand: Min guard         General transfer comment: min-guard as pt mildly light headed with initial standing  Ambulation/Gait Ambulation/Gait assistance: Min assist Gait Distance (Feet): 200  Feet Assistive device: None;Straight cane Gait Pattern/deviations: Step-through pattern;Ataxic Gait velocity: decreased Gait velocity interpretation: 1.31 - 2.62 ft/sec, indicative of limited community ambulator General Gait Details: without AD pt reaches for stable surfaces, eg hand rail, countertop due to ataxic LLE and unsteadiness. Trialed cane R hand which did increase safety but pt is going to need more practice at it feels very unfamiliar to her.  Stairs Stairs: Yes Stairs assistance: Min guard Stair Management: Two rails;Step to pattern;Forwards Number of Stairs: 5 General stair comments: pt led with RLE each time and descended with LLE without being cued due to decreased confidence in LLE  Wheelchair Mobility    Modified Rankin (Stroke Patients Only) Modified Rankin (Stroke Patients Only) Pre-Morbid Rankin Score: No symptoms Modified Rankin: Moderately severe disability     Balance Overall balance assessment: Needs assistance Sitting-balance support: No upper extremity supported;Feet supported Sitting balance-Leahy Scale: Good     Standing balance support: No upper extremity supported Standing balance-Leahy Scale: Fair Standing balance comment: able to maintain static stance without support but limitations evident with dynamic activity and perturbation                             Pertinent Vitals/Pain Pain Assessment: No/denies pain    Home Living Family/patient expects to be discharged to:: Private residence Living Arrangements: Spouse/significant other Available Help at Discharge: Family;Available 24 hours/day Type of Home: House Home Access: Stairs to enter Entrance Stairs-Rails: None Entrance Stairs-Number of Steps: 3 Home Layout: Two level;Able to live on main level with bedroom/bathroom Home Equipment:  Toilet riser;Walker - 2 wheels;Bedside commode      Prior Function Level of Independence: Independent         Comments: drives      Hand Dominance   Dominant Hand: Right    Extremity/Trunk Assessment   Upper Extremity Assessment Upper Extremity Assessment: Defer to OT evaluation;LUE deficits/detail LUE Deficits / Details: decreased grip strength L, cannot approximate thumb to 5th finger, poor coordination noted with functional tasks LUE Sensation: decreased proprioception LUE Coordination: decreased fine motor    Lower Extremity Assessment Lower Extremity Assessment: LLE deficits/detail LLE Deficits / Details: hip flex 4/5, hip ext 4+/5, knee ext 4/5, knee flex 4+/5, noted proprioception deficits LLE with ambulation LLE Sensation: decreased proprioception LLE Coordination: decreased gross motor    Cervical / Trunk Assessment Cervical / Trunk Assessment: Normal  Communication   Communication: No difficulties  Cognition Arousal/Alertness: Awake/alert Behavior During Therapy: WFL for tasks assessed/performed Overall Cognitive Status: Within Functional Limits for tasks assessed                                        General Comments General comments (skin integrity, edema, etc.): discussed safety concerns in relation to fall prevention and the need to protect the brain from any further insult in this acute phase    Exercises Total Joint Exercises Bridges: AROM;10 reps;Supine (keeping knees same distance from each other throughout) General Exercises - Lower Extremity Mini-Sqauts: AROM;Both;10 reps;Standing   Assessment/Plan    PT Assessment Patient needs continued PT services  PT Problem List Decreased balance;Decreased mobility;Decreased coordination;Decreased knowledge of precautions;Decreased knowledge of use of DME;Impaired sensation       PT Treatment Interventions DME instruction;Gait training;Stair training;Functional mobility training;Therapeutic activities;Therapeutic exercise;Balance training;Neuromuscular re-education;Patient/family education    PT Goals (Current goals  can be found in the Care Plan section)  Acute Rehab PT Goals Patient Stated Goal: return home PT Goal Formulation: With patient Time For Goal Achievement: 08/07/20 Potential to Achieve Goals: Good    Frequency Min 4X/week   Barriers to discharge        Co-evaluation               AM-PAC PT "6 Clicks" Mobility  Outcome Measure Help needed turning from your back to your side while in a flat bed without using bedrails?: None Help needed moving from lying on your back to sitting on the side of a flat bed without using bedrails?: None Help needed moving to and from a bed to a chair (including a wheelchair)?: A Little Help needed standing up from a chair using your arms (e.g., wheelchair or bedside chair)?: A Little Help needed to walk in hospital room?: A Little Help needed climbing 3-5 steps with a railing? : A Little 6 Click Score: 20    End of Session Equipment Utilized During Treatment: Gait belt Activity Tolerance: Patient tolerated treatment well Patient left: in bed;with call bell/phone within reach;with bed alarm set;with family/visitor present Nurse Communication: Mobility status PT Visit Diagnosis: Unsteadiness on feet (R26.81);Ataxic gait (R26.0)    Time: 9323-5573 PT Time Calculation (min) (ACUTE ONLY): 37 min   Charges:   PT Evaluation $PT Eval Moderate Complexity: 1 Mod PT Treatments $Gait Training: 8-22 mins        Leighton Roach, [place]  Pager [phone] Office [place] 07/24/2020, 1:13 PM

## 2020-07-24 NOTE — Progress Notes (Signed)
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  She had decreased grip on the left arm.  Admitted to the hospital with right hemispheric TIA/acute a stroke.  Clinical findings, garbled speech, left arm and leg weakness that is gradually improving. CT head findings, no acute findings.  Progressive chronic small vessel ischemic disease. MRI of the brain, pending. CTA of the head and neck, no large vessel occlusion.  70% atheromatous stenosis at the right carotid bulb.  50% stenosis at the left carotid bulb. 2D echocardiogram, normal ejection fraction.  Grade 2 diastolic dysfunction. Antiplatelet therapy, on Xarelto at home continued. LDL 20.  Too low. Hemoglobin A1c 5.4. DVT prophylaxis, SCDs.  Xarelto. Therapy recommendations, home health PT OT.  Short gut syndrome, hypoglycemia, long-term TPN and IV fluid management: Allow regular diet.  Lomotil as needed.  On anticipation of discharge, TPN was not started.  Will replace electrolytes aggressively.  We will keep patient on maintenance dextrose normal saline.  Continue to mobilize.  MRI pending.  Anticipate MRI later tonight and possible discharge tomorrow morning.

## 2020-07-24 NOTE — Plan of Care (Signed)

## 2020-07-24 NOTE — H&P (Signed)
History and Physical    [name] [name] GQQ:[id number] DOB: 06-24-1950 DOA: 07/23/2020  PCP: [name], MD   Patient coming from: Home via [facility] ER  Chief Complaint: Left arm and leg weakness/numbness, slurred speech  HPI: [name] [name] is a 70 y.o. female with medical history significant for hypercoagulable state, history of ischemic gut status post resection, short gut syndrome, anemia of chronic disease, chronic diastolic CHF, carotid stenosis, atherosclerosis who presents with complaint of slurred speech, left arm and leg numbness and weakness. She reports 2 days ago she began to have numbness and a heavy feeling in her L arm and leg.  Initially she states her arm and leg did not feel right but was not weak and she was still able to ambulate and do activities.  Today, about 5pm while riding in the car with her husband she began to have garbled speech and unable to move L arm or leg at all. She was unable to stand and pivot to get out of the car when they got home so she was brought to ER. While in the ER her symptoms have improved but she is not back to baseline strength in her left arm or leg. Her speech has improved and is at baseline. She is on Xarelto for prior clots hypercoagulable state  ED Course:   In the emergency room patient is hemodynamically stable and CTA of her head and neck showed no large vessel occlusion in the head.  She does have a 70% atherosclerotic stenosis of the right carotid bulb and a 50% stenosis of the left carotid bulb.  Her physician discussed with neurology recommended transfer to [facility] from [facility] ER for stroke work-up.  Patient was not a TPA candidate as she is on Xarelto and symptoms started 2 days ago  Review of Systems:  General: Denies fever, chills, weight loss, night sweats.  Denies dizziness.  Denies change in appetite HENT: Denies head trauma, headache, denies change in hearing, tinnitus.  Denies nasal congestion or bleeding.  Denies sore  throat, sores in mouth.  Denies difficulty swallowing Eyes: Denies blurry vision, pain in eye, drainage.  Denies discoloration of eyes. Neck: Denies pain.  Denies swelling.  Denies pain with movement. Cardiovascular: Denies chest pain, palpitations.  Denies edema.  Denies orthopnea Respiratory: Denies shortness of breath, cough.  Denies wheezing.  Denies sputum production Gastrointestinal: Denies abdominal pain, swelling.  Denies nausea, vomiting, diarrhea.  Denies melena.  Denies hematemesis. Musculoskeletal: Denies limitation of movement.  Denies deformity or swelling.  Denies pain.  Denies arthralgias or myalgias. Genitourinary: Denies pelvic pain.  Denies urinary frequency or hesitancy.  Denies dysuria.  Skin: Denies rash.  Denies petechiae, purpura, ecchymosis. Neurological: Reports slurred speech and weakness and numbness of the left arm and leg.  Denies headache.  Denies syncope.  Denies seizure activity. Denies drooping face.  Denies visual change. Psychiatric: Denies depression, anxiety. Denies hallucinations.  Past Medical History:  Diagnosis Date  . Abnormal LFTs 2013  . Allergic rhinosinusitis   . Anemia of chronic disease   . At risk for dental problems   . Atypical nevus   . Bacteremia due to Klebsiella pneumoniae 12/12/2011  . Bacterial overgrowth syndrome   . 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 Carotid stenosis    Carotid US (9/15):  R 40-59%; L 1-39% >> FU 1 year  . Closed left subtrochanteric femur fracture ([place]) 12/31/2017  . Congestive heart failure (  CHF) ([place])    EF 25-30%  . 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 Fracture of left clavicle   . History of blood transfusion 2013   anemia  . Hx of cardiovascular stress test    Myoview (9/15):  inf-apical scar; no ischemia; EF 47% - low risk   . 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 Osteoporosis   . Pancytopenia 10/07/2011  . Pathologic  fracture of neck of femur ([place])   . Personal history of colonic polyps   . Renal insufficiency    hx of yrs ago  . Serratia marcescens infection - bactermia assoc w/ PICC 01/18/2015  . 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[place])   . Splenomegaly    By ultrasound  . Thrombophilia ([place])   . Vitamin D deficiency     Past Surgical History:  Procedure Laterality Date  . APPENDECTOMY  yrs ago  . CHOLECYSTECTOMY  yrs ago  . COLONOSCOPY  12/05/2005   internal hemorrhoids (for polyp surveillance)  . COLONOSCOPY  04/26/2012   Procedure: COLONOSCOPY;  Surgeon: Gatha Mayer, MD;  Location: [facility];  Service: Endoscopy;  Laterality: N/A;  . COLONOSCOPY WITH PROPOFOL N/A 03/18/2016   Procedure: COLONOSCOPY WITH PROPOFOL;  Surgeon: Gatha Mayer, MD;  Location: [facility];  Service: Endoscopy;  Laterality: N/A;  . ESOPHAGOGASTRODUODENOSCOPY  01/22/2009   erosive esophagitis  . HARDWARE REMOVAL Left 12/31/2017   Procedure: HARDWARE REMOVAL;  Surgeon: Rod Can, MD;  Location: [facility];  Service: Orthopedics;  Laterality: Left;  . INTRAMEDULLARY (IM) NAIL INTERTROCHANTERIC Left 12/31/2017   Procedure: INTRAMEDULLARY (IM) NAIL SUBTROCHANTRIC;  Surgeon: Rod Can, MD;  Location: [facility];  Service: Orthopedics;  Laterality: Left;  . IR CV LINE INJECTION  03/23/2018  . IR FLUORO GUIDE CV LINE LEFT  01/01/2018  . IR FLUORO GUIDE CV LINE LEFT  03/24/2018  . IR FLUORO GUIDE CV LINE LEFT  05/21/2018  . IR FLUORO GUIDE CV LINE LEFT  07/09/2018  . IR GENERIC HISTORICAL  07/04/2016   IR REMOVAL TUN CV CATH W/O FL 07/04/2016 Ascencion Dike, PA-C [facility]  . IR GENERIC HISTORICAL  07/10/2016   IR US GUIDE VASC ACCESS LEFT 07/10/2016 Arne Cleveland, MD [facility]  . IR GENERIC HISTORICAL  07/10/2016   IR FLUORO GUIDE CV LINE LEFT 07/10/2016 Arne Cleveland, MD [facility]  . IR PATIENT EVAL TECH 0-60 MINS  10/07/2017  . IR PATIENT EVAL TECH 0-60 MINS  03/09/2018   . IR RADIOLOGIST EVAL & MGMT  07/21/2017  . LEFT HEART CATH AND CORONARY ANGIOGRAPHY N/A 06/26/2020   Procedure: LEFT HEART CATH AND CORONARY ANGIOGRAPHY;  Surgeon: Jolaine Artist, MD;  Location: [facility];  Service: Cardiovascular;  Laterality: N/A;  . ORIF PROXIMAL FEMORAL FRACTURE W/ ITST NAIL SYSTEM  03/2007   left, Dr. Shellia Carwin  . SMALL INTESTINE SURGERY  2005   multiple with right colon resection for ischemia/infarct    Social History  reports that she quit smoking about 16 years ago. Her smoking use included cigarettes. She has never used smokeless tobacco. She reports that she does not drink alcohol and does not use drugs.  Allergies  Allergen Reactions  . Penicillins Itching, Swelling and Other (See Comments)    Reaction:  Facial swelling Has patient had a PCN reaction causing immediate rash, facial/tongue/throat swelling, SOB or lightheadedness with hypotension: Yes Has patient had a PCN reaction causing  severe rash involving mucus membranes or skin necrosis: No Has patient had a PCN reaction that required hospitalization No Has patient had a PCN reaction occurring within the last 10 years: No If all of the above answers are "NO", then may proceed with Cephalosporin use.    Family History  Problem Relation Age of Onset  . Diabetes Mother   . Hypertension Mother   . AAA (abdominal aortic aneurysm) Mother   . Colon cancer Neg Hx   . Stomach cancer Neg Hx      Prior to Admission medications   Medication Sig Start Date End Date Taking? Authorizing Provider  acetaminophen (TYLENOL) 500 MG tablet Take 1,000 mg by mouth daily as needed for mild pain.   Yes [provider]  Calcium Carb-Cholecalciferol (CALCIUM + D3 PO) Take 2 tablets by mouth daily.   Yes [provider]  cetirizine (ZYRTEC) 10 MG tablet Take 1 tablet (10 mg total) by mouth daily. Patient taking differently: Take 10 mg by mouth every evening. 12/02/19  Yes [name],  MD  clobetasol ointment (TEMOVATE) 7.51 % Apply 1 application topically 2 (two) times daily. Patient taking differently: Apply 1 application topically 2 (two) times daily as needed (skin irritation). 12/23/18  Yes Comer, Okey Regal, MD  diphenhydrAMINE (BENADRYL) 25 mg capsule Take 25-50 mg by mouth every 6 (six) hours as needed for itching or sleep.    Yes [provider]  Multiple Vitamins-Minerals (ADULT GUMMY PO) Take 2 tablets by mouth daily.   Yes [provider]  omeprazole (PRILOSEC) 40 MG capsule TAKE 1 CAPSULE 2 (TWO) TIMES DAILY BY MOUTH. TAKE BEFORE BREAKFAST AND SUPPER Patient taking differently: Take 40 mg by mouth daily before breakfast. TAKE 1 CAPSULE 2 (TWO) TIMES DAILY BY MOUTH. TAKE BEFORE BREAKFAST AND SUPPER 05/17/20  Yes Gatha Mayer, MD  ondansetron (ZOFRAN-ODT) 8 MG disintegrating tablet TAKE 1 TABLET (8 MG TOTAL) BY MOUTH EVERY 8 (EIGHT) HOURS AS NEEDED FOR NAUSEA OR VOMITING. 12/05/19  Yes Gatha Mayer, MD  sacubitril-valsartan (ENTRESTO) 24-26 MG Take 1 tablet by mouth 2 (two) times daily. 05/09/20  Yes Josue Hector, MD  Teduglutide, rDNA, (GATTEX) 5 MG KIT Inject 3.3 Units into the skin daily. Patient taking differently: Inject 3.1 Units into the skin daily. 09/11/14  Yes Gatha Mayer, MD  vitamin E 1000 UNIT capsule Take 1,000 Units by mouth daily.   Yes [provider]  XARELTO 20 MG TABS tablet TAKE 1 TABLET (20 MG TOTAL) BY MOUTH DAILY WITH SUPPER. Patient taking differently: Take 20 mg by mouth daily with supper. 02/17/20  Yes [name], MD  ADULT TPN Inject 1,800 mLs into the vein 4 (four) times a week. Pt receives home TPN from [facility]:  1800 mL bag, four nights weekly (Monday, Tuesday, Wednesday, Thursday for 8 hours (includes 1 hour taper up and down).    [provider]  denosumab (PROLIA) 60 MG/ML SOSY injection Inject 60 mg into the skin every 6 (six) months.    [name], MD  fluticasone (FLONASE) 50  MCG/ACT nasal spray SPRAY 2 SPRAYS INTO EACH NOSTRIL EVERY DAY Patient taking differently: Place 1-2 sprays into both nostrils daily as needed for allergies. 05/31/20   [name], MD  furosemide (LASIX) 20 MG tablet Take one tablet by mouth as needed for swelling Patient taking differently: Take 20 mg by mouth daily as needed (swelling). 05/09/20   Josue Hector, MD  Heparin Sodium,  Porcine, (HEPARIN LOCK FLUSH IJ) Inject 5 mLs as directed 4 (four) times a week. Monday, Tuesday, Wednesday, Thursday in the morning with TPN    [provider]  sodium chloride 0.9 % infusion Inject 900 mLs into the vein every 14 (fourteen) days. Uses for TPN 10/27/17   [provider]  spironolactone (ALDACTONE) 25 MG tablet Take 1 tablet (25 mg total) by mouth daily. 07/04/20   Bensimhon, Shaune Pascal, MD    Physical Exam: Vitals:   07/24/20 0030 07/24/20 0100 07/24/20 0230 07/24/20 0354  BP: (!) 82/46 (!) 89/57 (!) 94/48 (!) 109/45  Pulse: (!) 53 (!) 54 (!) 57 (!) 57  Resp: _0 Temp: 97.9 F (36.6 C)   98 F (36.7 C)  TempSrc: Oral   Oral  SpO2: 98% 95% 100% 98%  Weight:    65.9 kg  Height:    _1  (1.727 m)    Constitutional: NAD, calm, comfortable Vitals:   07/24/20 0030 07/24/20 0100 07/24/20 0230 07/24/20 0354  BP: (!) 82/46 (!) 89/57 (!) 94/48 (!) 109/45  Pulse: (!) 53 (!) 54 (!) 57 (!) 57  Resp: _2 Temp: 97.9 F (36.6 C)   98 F (36.7 C)  TempSrc: Oral   Oral  SpO2: 98% 95% 100% 98%  Weight:    65.9 kg  Height:    _3  (1.727 m)   General: WDWN, Alert and oriented x3.  Eyes: EOMI, PERRL, conjunctivae normal.  Sclera nonicteric HENT:  [place]/AT, external ears normal.  Nares patent without epistasis.  Mucous membranes are moist. Neck: Soft, normal range of motion, supple, no masses, Trachea midline Respiratory: clear to auscultation bilaterally, no wheezing, no crackles. Normal respiratory effort. No accessory muscle use.  Cardiovascular: Regular rate  and rhythm, no murmurs / rubs / gallops. No extremity edema. 2+ pedal pulses. Port a cath in left upper chest.  Abdomen: Soft, no tenderness, nondistended, no rebound or guarding.  No masses palpated. No hepatosplenomegaly. Bowel sounds normoactive Musculoskeletal: FROM. no cyanosis. No joint deformity upper and lower extremities. Normal muscle tone.  Skin: Warm, dry, intact no rashes, lesions, ulcers. No induration Neurologic: CN 2-12 grossly intact.  Normal speech.  Sensation intact, patella DTR +1 bilaterally. Strength 5/5 right arm and leg. Strength 4/5 left arm and leg. Normal heel to shin. No pronator drift.  Psychiatric: Normal judgment and insight. Normal mood.    Labs on Admission: I have personally reviewed following labs and imaging studies  CBC: Recent Labs  Lab 07/23/20 1920  WBC 10.7*  NEUTROABS 9.0*  HGB 9.3*  HCT 29.3*  MCV 100.0  PLT 115*    Basic Metabolic Panel: Recent Labs  Lab 07/23/20 1920  NA 133*  K 3.0*  CL 112*  CO2 14*  GLUCOSE 81  BUN 10  CREATININE 1.26*  CALCIUM 7.9*  MG 1.7    GFR: Estimated Creatinine Clearance: 41.9 mL/min (A) (by C-G formula based on SCr of 1.26 mg/dL (H)).  Liver Function Tests: Recent Labs  Lab 07/23/20 1920  AST 19  ALT 15  ALKPHOS 94  BILITOT 0.3  PROT 7.2  ALBUMIN 2.9*    Urine analysis:    Component Value Date/Time   COLORURINE YELLOW 04/28/2020 1325   APPEARANCEUR CLEAR 04/28/2020 1325   LABSPEC 1.015 04/28/2020 1325   PHURINE 6.0 04/28/2020 1325   GLUCOSEU NEGATIVE 04/28/2020 [place] (A) 04/28/2020 1325   HGBUR trace-intact 04/12/2010 0958   BILIRUBINUR  NEGATIVE 04/28/2020 1325   BILIRUBINUR neg 04/08/2011 0947   KETONESUR NEGATIVE 04/28/2020 1325   PROTEINUR NEGATIVE 04/28/2020 1325   UROBILINOGEN 0.2 09/17/2012 1122   NITRITE NEGATIVE 04/28/2020 1325   LEUKOCYTESUR NEGATIVE 04/28/2020 1325    Radiological Exams on Admission: CT Angio Head W or Wo Contrast  Result Date:  07/23/2020 CLINICAL DATA:  Initial evaluation for acute stroke, left-sided weakness with slurred speech. EXAM: CT ANGIOGRAPHY HEAD AND NECK TECHNIQUE: Multidetector CT imaging of the head and neck was performed using the standard protocol during bolus administration of intravenous contrast. Multiplanar CT image reconstructions and MIPs were obtained to evaluate the vascular anatomy. Carotid stenosis measurements (when applicable) are obtained utilizing NASCET criteria, using the distal internal carotid diameter as the denominator. CONTRAST:  60m OMNIPAQUE IOHEXOL 350 MG/ML SOLN COMPARISON:  Prior head CT from earlier the same day. FINDINGS: CTA NECK FINDINGS Aortic arch: Visualized aortic arch of normal caliber with normal branch pattern. Mild-to-moderate atheromatous change about the arch and origin of the great vessels without hemodynamically significant stenosis. Visualized subclavian arteries patent. Right carotid system: Right CCA patent from its origin to the bifurcation without stenosis. Eccentric calcified plaque at the right carotid bulb/proximal right ICA with associated stenosis of up to 70% by NASCET criteria. Right ICA patent distally without stenosis, dissection or occlusion. Left carotid system: Left CCA patent from its origin to the bifurcation without stenosis. Scattered calcified plaque about the left carotid bulb/proximal left ICA with associated stenosis of up to 50% by NASCET criteria. Left ICA patent distally without stenosis, dissection or occlusion. Vertebral arteries: Both vertebral arteries arise from the subclavian arteries. Vertebral arteries patent within the neck without stenosis, dissection, or occlusion. Skeleton: No visible acute osseous finding. No discrete or worrisome osseous lesions. Other neck: No other acute soft tissue abnormality within the neck. No mass or adenopathy. Upper chest: Visualized upper chest demonstrates no acute finding. Review of the MIP images confirms the  above findings CTA HEAD FINDINGS Anterior circulation: Petrous segments widely patent. Scattered atheromatous plaque within the carotid siphons with no more than mild multifocal stenosis. A1 segments patent bilaterally. Normal anterior communicating artery complex. Anterior cerebral arteries patent to their distal aspects without stenosis. No M1 stenosis or occlusion. 5 mm aneurysm noted at the left MCA bifurcation (series 7, image 262). Right MCA bifurcation within normal limits. Distal MCA branches well perfused and symmetric. Posterior circulation: Both V4 segments patent to the vertebrobasilar junction without significant stenosis. Left vertebral artery slightly dominant. Both PICA origins patent and normal. Basilar patent to its distal aspect without significant stenosis. Superior cerebellar arteries patent bilaterally. Both PCAs well perfused to their distal aspects without stenosis. Small bilateral posterior communicating arteries noted. Venous sinuses: Grossly patent allowing for timing the contrast bolus. Anatomic variants: None significant. Review of the MIP images confirms the above findings IMPRESSION: 1. Negative CTA for large vessel occlusion. 2. 70% atheromatous stenosis at the right carotid bulb/origin of the right ICA. 3. 50% atheromatous stenosis at the left carotid bulb/origin of the left ICA. 4. 5 mm left MCA bifurcation aneurysm. Electronically Signed   By: BJeannine BogaM.D.   On: 07/23/2020 21:55   CT HEAD WO CONTRAST  Result Date: 07/23/2020 CLINICAL DATA:  Neuro deficit, acute, stroke suspected. Additional history provided: Left-sided weakness in arm and leg. Slurred speech. EXAM: CT HEAD WITHOUT CONTRAST TECHNIQUE: Contiguous axial images were obtained from the base of the skull through the vertex without intravenous contrast. COMPARISON:  Head CT 02/28/2010. FINDINGS:  Brain: Mild cerebral and cerebellar atrophy. Redemonstrated prominent perivascular spaces within the inferior  basal ganglia bilaterally. Patchy ill-defined hypoattenuation within the cerebral white matter, progressed in the right centrum semiovale and corona radiata as compared to the prior head CT of 02/28/2010. There is no acute intracranial hemorrhage. No demarcated cortical infarct. No extra-axial fluid collection. No evidence of intracranial mass. No midline shift. Vascular: No hyperdense vessel.  Atherosclerotic calcifications. Skull: Normal. Negative for fracture or focal lesion. Sinuses/Orbits: Visualized orbits show no acute finding. Trace scattered paranasal sinus mucosal thickening at the imaged levels. IMPRESSION: No acute intracranial hemorrhage or acute demarcated cortical infarction. Patchy and ill-defined hypoattenuation within the bilateral cerebral white matter, progressed in the right centrum semiovale and corona radiata as compared to the prior head CT of 02/28/2010. Findings may reflect progressive chronic small vessel ischemic disease. However, given the provided history consider a brain MRI to exclude an acute white matter infarct at this site. Minimal paranasal sinus mucosal thickening at the imaged levels. Electronically Signed   By: Kellie Simmering DO   On: 07/23/2020 19:16   CT Angio Neck W and/or Wo Contrast  Result Date: 07/23/2020 CLINICAL DATA:  Initial evaluation for acute stroke, left-sided weakness with slurred speech. EXAM: CT ANGIOGRAPHY HEAD AND NECK TECHNIQUE: Multidetector CT imaging of the head and neck was performed using the standard protocol during bolus administration of intravenous contrast. Multiplanar CT image reconstructions and MIPs were obtained to evaluate the vascular anatomy. Carotid stenosis measurements (when applicable) are obtained utilizing NASCET criteria, using the distal internal carotid diameter as the denominator. CONTRAST:  81m OMNIPAQUE IOHEXOL 350 MG/ML SOLN COMPARISON:  Prior head CT from earlier the same day. FINDINGS: CTA NECK FINDINGS Aortic arch:  Visualized aortic arch of normal caliber with normal branch pattern. Mild-to-moderate atheromatous change about the arch and origin of the great vessels without hemodynamically significant stenosis. Visualized subclavian arteries patent. Right carotid system: Right CCA patent from its origin to the bifurcation without stenosis. Eccentric calcified plaque at the right carotid bulb/proximal right ICA with associated stenosis of up to 70% by NASCET criteria. Right ICA patent distally without stenosis, dissection or occlusion. Left carotid system: Left CCA patent from its origin to the bifurcation without stenosis. Scattered calcified plaque about the left carotid bulb/proximal left ICA with associated stenosis of up to 50% by NASCET criteria. Left ICA patent distally without stenosis, dissection or occlusion. Vertebral arteries: Both vertebral arteries arise from the subclavian arteries. Vertebral arteries patent within the neck without stenosis, dissection, or occlusion. Skeleton: No visible acute osseous finding. No discrete or worrisome osseous lesions. Other neck: No other acute soft tissue abnormality within the neck. No mass or adenopathy. Upper chest: Visualized upper chest demonstrates no acute finding. Review of the MIP images confirms the above findings CTA HEAD FINDINGS Anterior circulation: Petrous segments widely patent. Scattered atheromatous plaque within the carotid siphons with no more than mild multifocal stenosis. A1 segments patent bilaterally. Normal anterior communicating artery complex. Anterior cerebral arteries patent to their distal aspects without stenosis. No M1 stenosis or occlusion. 5 mm aneurysm noted at the left MCA bifurcation (series 7, image 262). Right MCA bifurcation within normal limits. Distal MCA branches well perfused and symmetric. Posterior circulation: Both V4 segments patent to the vertebrobasilar junction without significant stenosis. Left vertebral artery slightly  dominant. Both PICA origins patent and normal. Basilar patent to its distal aspect without significant stenosis. Superior cerebellar arteries patent bilaterally. Both PCAs well perfused to their distal aspects without stenosis.  Small bilateral posterior communicating arteries noted. Venous sinuses: Grossly patent allowing for timing the contrast bolus. Anatomic variants: None significant. Review of the MIP images confirms the above findings IMPRESSION: 1. Negative CTA for large vessel occlusion. 2. 70% atheromatous stenosis at the right carotid bulb/origin of the right ICA. 3. 50% atheromatous stenosis at the left carotid bulb/origin of the left ICA. 4. 5 mm left MCA bifurcation aneurysm. Electronically Signed   By: Jeannine Boga M.D.   On: 07/23/2020 21:55    EKG: Independently reviewed.  EKG shows normal sinus rhythm.  Multiple PVCs.  Left bundle branch block.  No acute ST elevation.  QTc 432  Assessment/Plan Principal Problem:   Cerebrovascular accident (CVA)  Ms. Mallet will be observed on medical telemetry floor for TIA/CVA.  Will obtain MRI of brain to rule out acute ischemic CVA.  CTA of the head neck is already been obtained and shows a 50% left carotid bulb stenosis from after atherosclerosis.  There is a 70% stenosis of the right carotid bulb from atherosclerosis.  No large vessel occlusions in the head. Obtain echocardiogram to evaluate for PFO, wall motion and ejection fraction. Continue blood pressure control.  Has been greater than 24 hours the patient has had symptoms Antiplatelet therapy with aspirin daily. Check lipid panel.  Start statin when cholesterol levels are known to take appropriate dosage Neurochecks per stroke protocol  Active Problems:   Anemia of chronic disease Stable   Chronic systolic heart failure Stable.  Continue Entresto.  Echocardiogram will be obtained    Thrombocytopenia Stable.  No active bleeding    Acquired short bowel syndrome Chronic.   Patient uses TPN Monday through Thursday at home.  Will need to have dietitian evaluate patient and assist with arranging TPN    Hypercoagulation syndrome  Chronic.  On Xarelto which is continued    CKD (chronic kidney disease), stage III  Stable.  On ARB therapy     DVT prophylaxis: Patient is on Xarelto for anticoagulation which is continued per neurology. Code Status:   Full code Family Communication:  Diagnosis and plan discussed with patient.  Questions answered.  Further recommendations to follow as clinically indicated Disposition Plan:   Patient is from:  Home  Anticipated DC to:  Home  Anticipated DC date:  Anticipate less than 2 midnight stay in the hospital  Anticipated DC barriers: No barriers to discharge identified at this time  Consults called:  Neurology Admission status:  Observation   Eben Burow MD [facility]  How to contact the [facility] Attending or Consulting provider [place] or covering provider during after hours [place], for this patient?   1. Check the care team in [facility] and look for a) attending/consulting TRH provider listed and b) the [facility] team listed 2. Log into [email or web address] and use [place]'s universal password to access. If you do not have the password, please contact the hospital operator. 3. Locate the [facility] provider you are looking for under [facility] and page to a number that you can be directly reached. 4. If you still have difficulty reaching the provider, please page the [facility] (Director on Call) for the Hospitalists listed on amion for assistance.  07/24/2020, 5:24 AM

## 2020-07-24 NOTE — TOC Initial Note (Signed)
Transition of Care [facility]) - Initial/Assessment Note    Patient Details  Name: [name] [name] MRN: [id number] Date of Birth: [birth year]  Transition of Care [facility]) CM/SW Contact:    Pollie Friar, RN Phone Number: 07/24/2020, 3:30 PM  Clinical Narrative:                 Pt lives at home with her spouse (who is home most of the time). She states daughter lives very close.  Pt denies issues with home medications or transportation.  [place] services arranged through [place] per pt preference. [facility] with [facility] accepted the referral. TOC following for further d/c needs.   Expected Discharge Plan: [place] Barriers to Discharge: Continued Medical Work up   Patient Goals and CMS Choice   CMS Medicare.gov Compare Post Acute Care list provided to:: Patient Choice offered to / list presented to : Patient  Expected Discharge Plan and Services Expected Discharge Plan: [place]   Discharge Planning Services: CM Consult Post Acute Care Choice: Home Health,Durable Medical Equipment Living arrangements for the past 2 months: Single Family Home                 DME Arranged: Kasandra Knudsen DME Agency: [employer] Date DME Agency Contacted: 07/24/20   Representative spoke with at DME Agency: [place]: PT,OT [place] Agency: [place] ([place]) Date HH Agency Contacted: 07/24/20   Representative spoke with at [place]: Ramond Marrow  Prior Living Arrangements/Services Living arrangements for the past 2 months: [place] Lives with:: Spouse Patient language and need for interpreter reviewed:: Yes Do you feel safe going back to the place where you live?: Yes      Need for Family Participation in Patient Care: Yes (Comment) Care giver support system in place?: Yes (comment) Current home services: DME (3 in 1/ walker/ shower seat) Criminal Activity/Legal Involvement Pertinent to Current Situation/Hospitalization: No - Comment as  needed  Activities of Daily Living Home Assistive Devices/Equipment: None ADL Screening (condition at time of admission) Patient's cognitive ability adequate to safely complete daily activities?: Yes Is the patient deaf or have difficulty hearing?: No Does the patient have difficulty seeing, even when wearing glasses/contacts?: No Does the patient have difficulty concentrating, remembering, or making decisions?: No Patient able to express need for assistance with ADLs?: Yes Does the patient have difficulty dressing or bathing?: No Independently performs ADLs?: Yes (appropriate for developmental age) Does the patient have difficulty walking or climbing stairs?: No Weakness of Legs: Left Weakness of Arms/Hands: Left  Permission Sought/Granted                  Emotional Assessment Appearance:: Appears stated age Attitude/Demeanor/Rapport: Engaged Affect (typically observed): Accepting Orientation: : Oriented to Self,Oriented to Place,Oriented to  Time,Oriented to Situation   Psych Involvement: No (comment)  Admission diagnosis:  Stroke [facility]) [I63.9] Cerebrovascular accident (CVA), unspecified mechanism ([place]) [I63.9] Patient Active Problem List   Diagnosis Date Noted  . Cerebrovascular accident (CVA) ([place]) 07/24/2020  . Thrombocytopenia ([place]) 07/24/2020  . CKD (chronic kidney disease), stage III ([place]) 07/24/2020  . Pressure injury of skin 07/24/2020  . Stroke ([place]) 07/23/2020  . 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 Hypotension 12/30/2017  . Bradycardia 12/30/2017  . 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 Degenerative joint disease 07/08/2016  . PICC line infection 05/16/2015  . 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 Splenomegaly   .  Hx of adenomatous colonic polyps 04/18/2012  . Chronic systolic heart failure ([place]) 12/25/2011  . Osteoporosis 03/14/2010  . Anemia of chronic disease 10/01/2009   . GERD (gastroesophageal reflux disease) 11/19/2008  . ALKALINE PHOSPHATASE, ELEVATED 10/12/2008  . VITAMIN D DEFICIENCY 01/19/2008  . TRANSAMINASES, SERUM, ELEVATED 01/19/2008  . Bacterial overgrowth syndrome 11/19/2004  . Acquired short bowel syndrome 11/20/2003   PCP:  [name], MD Pharmacy:   [facility] [phone], [address] [place] [address] Phone: [phone] Fax: [phone]     Social Determinants of Health (SDOH) Interventions    Readmission Risk Interventions No flowsheet data found.

## 2020-07-24 NOTE — Progress Notes (Addendum)
STROKE TEAM PROGRESS NOTE   INTERVAL HISTORY Her daughter is at the bedside.  She reports that about 2 days ago she started having some Left sided heaviness. She reports she did nothing about it until yesterday when she was in the car with her husband she began having slurred speech and was unable to get out of the car. At that point she came to the hospital. Her symptoms had already began to resolve by the time she got to the hospital. She reports that she has recovered most of her strength and her slurring of her speech has stopped. MRI scan brain is pending.  CT angiogram shows no significant large vessel stenosis. Hospitalists are Primary.  Vitals:   07/24/20 0100 07/24/20 0230 07/24/20 0354 07/24/20 0727  BP: (!) 89/57 (!) 94/48 (!) 109/45 (!) 92/49  Pulse: (!) 54 (!) 57 (!) 57 (!) 54  Resp: 14  18 16   Temp:   98 F (36.7 C) 98.1 F (36.7 C)  TempSrc:   Oral Oral  SpO2: 95% 100% 98% 99%  Weight:   65.9 kg   Height:   5\' 8"  (1.727 m)    CBC:  Recent Labs  Lab 07/23/20 1920  WBC 10.7*  NEUTROABS 9.0*  HGB 9.3*  HCT 29.3*  MCV 100.0  PLT 403*   Basic Metabolic Panel:  Recent Labs  Lab 07/23/20 1920 07/24/20 0812  NA 133* 135  K 3.0* 3.0*  CL 112* 115*  CO2 14* 14*  GLUCOSE 81 83  BUN 10 10  CREATININE 1.26* 1.32*  CALCIUM 7.9* 8.0*  MG 1.7  --    Lipid Panel:  Recent Labs  Lab 07/24/20 0501  CHOL 72  TRIG 175*  HDL 17*  CHOLHDL 4.2  VLDL 35  LDLCALC 20   HgbA1c:  Recent Labs  Lab 07/24/20 0501  HGBA1C 5.4   Urine Drug Screen: No results for input(s): LABOPIA, COCAINSCRNUR, LABBENZ, AMPHETMU, THCU, LABBARB in the last 168 hours.  Alcohol Level No results for input(s): ETH in the last 168 hours.  IMAGING past 24 hours CT Angio Head W or Wo Contrast  Result Date: 07/23/2020 CLINICAL DATA:  Initial evaluation for acute stroke, left-sided weakness with slurred speech. EXAM: CT ANGIOGRAPHY HEAD AND NECK TECHNIQUE: Multidetector CT imaging of the head  and neck was performed using the standard protocol during bolus administration of intravenous contrast. Multiplanar CT image reconstructions and MIPs were obtained to evaluate the vascular anatomy. Carotid stenosis measurements (when applicable) are obtained utilizing NASCET criteria, using the distal internal carotid diameter as the denominator. CONTRAST:  58mL OMNIPAQUE IOHEXOL 350 MG/ML SOLN COMPARISON:  Prior head CT from earlier the same day. FINDINGS: CTA NECK FINDINGS Aortic arch: Visualized aortic arch of normal caliber with normal branch pattern. Mild-to-moderate atheromatous change about the arch and origin of the great vessels without hemodynamically significant stenosis. Visualized subclavian arteries patent. Right carotid system: Right CCA patent from its origin to the bifurcation without stenosis. Eccentric calcified plaque at the right carotid bulb/proximal right ICA with associated stenosis of up to 70% by NASCET criteria. Right ICA patent distally without stenosis, dissection or occlusion. Left carotid system: Left CCA patent from its origin to the bifurcation without stenosis. Scattered calcified plaque about the left carotid bulb/proximal left ICA with associated stenosis of up to 50% by NASCET criteria. Left ICA patent distally without stenosis, dissection or occlusion. Vertebral arteries: Both vertebral arteries arise from the subclavian arteries. Vertebral arteries patent within the neck without stenosis, dissection,  or occlusion. Skeleton: No visible acute osseous finding. No discrete or worrisome osseous lesions. Other neck: No other acute soft tissue abnormality within the neck. No mass or adenopathy. Upper chest: Visualized upper chest demonstrates no acute finding. Review of the MIP images confirms the above findings CTA HEAD FINDINGS Anterior circulation: Petrous segments widely patent. Scattered atheromatous plaque within the carotid siphons with no more than mild multifocal stenosis. A1  segments patent bilaterally. Normal anterior communicating artery complex. Anterior cerebral arteries patent to their distal aspects without stenosis. No M1 stenosis or occlusion. 5 mm aneurysm noted at the left MCA bifurcation (series 7, image 262). Right MCA bifurcation within normal limits. Distal MCA branches well perfused and symmetric. Posterior circulation: Both V4 segments patent to the vertebrobasilar junction without significant stenosis. Left vertebral artery slightly dominant. Both PICA origins patent and normal. Basilar patent to its distal aspect without significant stenosis. Superior cerebellar arteries patent bilaterally. Both PCAs well perfused to their distal aspects without stenosis. Small bilateral posterior communicating arteries noted. Venous sinuses: Grossly patent allowing for timing the contrast bolus. Anatomic variants: None significant. Review of the MIP images confirms the above findings IMPRESSION: 1. Negative CTA for large vessel occlusion. 2. 70% atheromatous stenosis at the right carotid bulb/origin of the right ICA. 3. 50% atheromatous stenosis at the left carotid bulb/origin of the left ICA. 4. 5 mm left MCA bifurcation aneurysm. Electronically Signed   By: Jeannine Boga M.D.   On: 07/23/2020 21:55   CT HEAD WO CONTRAST  Result Date: 07/23/2020 CLINICAL DATA:  Neuro deficit, acute, stroke suspected. Additional history provided: Left-sided weakness in arm and leg. Slurred speech. EXAM: CT HEAD WITHOUT CONTRAST TECHNIQUE: Contiguous axial images were obtained from the base of the skull through the vertex without intravenous contrast. COMPARISON:  Head CT 02/28/2010. FINDINGS: Brain: Mild cerebral and cerebellar atrophy. Redemonstrated prominent perivascular spaces within the inferior basal ganglia bilaterally. Patchy ill-defined hypoattenuation within the cerebral white matter, progressed in the right centrum semiovale and corona radiata as compared to the prior head CT of  02/28/2010. There is no acute intracranial hemorrhage. No demarcated cortical infarct. No extra-axial fluid collection. No evidence of intracranial mass. No midline shift. Vascular: No hyperdense vessel.  Atherosclerotic calcifications. Skull: Normal. Negative for fracture or focal lesion. Sinuses/Orbits: Visualized orbits show no acute finding. Trace scattered paranasal sinus mucosal thickening at the imaged levels. IMPRESSION: No acute intracranial hemorrhage or acute demarcated cortical infarction. Patchy and ill-defined hypoattenuation within the bilateral cerebral white matter, progressed in the right centrum semiovale and corona radiata as compared to the prior head CT of 02/28/2010. Findings may reflect progressive chronic small vessel ischemic disease. However, given the provided history consider a brain MRI to exclude an acute white matter infarct at this site. Minimal paranasal sinus mucosal thickening at the imaged levels. Electronically Signed   By: Kellie Simmering DO   On: 07/23/2020 19:16   CT Angio Neck W and/or Wo Contrast  Result Date: 07/23/2020 CLINICAL DATA:  Initial evaluation for acute stroke, left-sided weakness with slurred speech. EXAM: CT ANGIOGRAPHY HEAD AND NECK TECHNIQUE: Multidetector CT imaging of the head and neck was performed using the standard protocol during bolus administration of intravenous contrast. Multiplanar CT image reconstructions and MIPs were obtained to evaluate the vascular anatomy. Carotid stenosis measurements (when applicable) are obtained utilizing NASCET criteria, using the distal internal carotid diameter as the denominator. CONTRAST:  56mL OMNIPAQUE IOHEXOL 350 MG/ML SOLN COMPARISON:  Prior head CT from earlier the same day.  FINDINGS: CTA NECK FINDINGS Aortic arch: Visualized aortic arch of normal caliber with normal branch pattern. Mild-to-moderate atheromatous change about the arch and origin of the great vessels without hemodynamically significant stenosis.  Visualized subclavian arteries patent. Right carotid system: Right CCA patent from its origin to the bifurcation without stenosis. Eccentric calcified plaque at the right carotid bulb/proximal right ICA with associated stenosis of up to 70% by NASCET criteria. Right ICA patent distally without stenosis, dissection or occlusion. Left carotid system: Left CCA patent from its origin to the bifurcation without stenosis. Scattered calcified plaque about the left carotid bulb/proximal left ICA with associated stenosis of up to 50% by NASCET criteria. Left ICA patent distally without stenosis, dissection or occlusion. Vertebral arteries: Both vertebral arteries arise from the subclavian arteries. Vertebral arteries patent within the neck without stenosis, dissection, or occlusion. Skeleton: No visible acute osseous finding. No discrete or worrisome osseous lesions. Other neck: No other acute soft tissue abnormality within the neck. No mass or adenopathy. Upper chest: Visualized upper chest demonstrates no acute finding. Review of the MIP images confirms the above findings CTA HEAD FINDINGS Anterior circulation: Petrous segments widely patent. Scattered atheromatous plaque within the carotid siphons with no more than mild multifocal stenosis. A1 segments patent bilaterally. Normal anterior communicating artery complex. Anterior cerebral arteries patent to their distal aspects without stenosis. No M1 stenosis or occlusion. 5 mm aneurysm noted at the left MCA bifurcation (series 7, image 262). Right MCA bifurcation within normal limits. Distal MCA branches well perfused and symmetric. Posterior circulation: Both V4 segments patent to the vertebrobasilar junction without significant stenosis. Left vertebral artery slightly dominant. Both PICA origins patent and normal. Basilar patent to its distal aspect without significant stenosis. Superior cerebellar arteries patent bilaterally. Both PCAs well perfused to their distal  aspects without stenosis. Small bilateral posterior communicating arteries noted. Venous sinuses: Grossly patent allowing for timing the contrast bolus. Anatomic variants: None significant. Review of the MIP images confirms the above findings IMPRESSION: 1. Negative CTA for large vessel occlusion. 2. 70% atheromatous stenosis at the right carotid bulb/origin of the right ICA. 3. 50% atheromatous stenosis at the left carotid bulb/origin of the left ICA. 4. 5 mm left MCA bifurcation aneurysm. Electronically Signed   By: Jeannine Boga M.D.   On: 07/23/2020 21:55    PHYSICAL EXAM Blood pressure (!) 92/49, pulse (!) 54, temperature 98.1 F (36.7 C), temperature source Oral, resp. rate 16, height 5\' 8"  (1.727 m), weight 65.9 kg, last menstrual period 02/26/2014, SpO2 99 %.  General: alert and awake, elderly Caucasian female, no apparent distress  Lungs: Symmetrical Chest rise, no labored breathing  Cardio: Regular Rate and Rhythm  Abdomen: Soft, non-tender  Neuro: Alert, oriented, thought content appropriate.  Speech fluent without evidence of aphasia.  Able to follow all commands without difficulty. Cranial Nerves: II:  Visual fields grossly normal, pupils equal, round, reactive to light and accommodation III,IV, VI: ptosis not present, extra-ocular motions intact bilaterally V,VII: smile symmetric, facial light touch sensation normal bilaterally VIII: hearing normal bilaterally IX,X: uvula rises symmetrically XI: bilateral shoulder shrug XII: midline tongue extension without atrophy or fasciculations  Motor: Fine motor is reduced in the Left hand and there is orbiting present on the Left. Right : Upper extremity   5/5    Left:     Upper extremity   4-/5  Lower extremity   5/5     Lower extremity   4-/5 Tone and bulk:normal tone throughout; no atrophy noted Sensory:  light touch intact throughout, bilaterally Cerebellar: normal finger-to-nose,  normal heel-to-shin test Gait:  deferred    ASSESSMENT/PLAN [name] [name] is a 70 y.o. female with history of hypercoagulable state, history of ischemic gut status post resection, short gut syndrome, anemia of chronic disease, chronic diastolic CHF, carotid stenosis, atherosclerosis who presents with complaint of slurred speech, left arm and leg numbness and weakness. She reports 2 days ago she began to have numbness and a heavy feeling in her L arm and leg.  Initially she states her arm and leg did not feel right but was not weak and she was still able to ambulate and do activities.  Today, about 5pm while riding in the car with her husband she began to have garbled speech and unable to move L arm or leg at all. She was unable to stand and pivot to get out of the car when they got home so she was brought to ER. While in the ER her symptoms have improved but she is not back to baseline strength in her left arm or leg. Her speech has improved and is at baseline  At this point imaging has not shown any acute processes will get MRI to help exclude acute white matter infarct.  Right hemispheric TIA versus small subcortical infarct  likely from small vessel ischemic disease .  CT head - No acute intracranial hemorrhage or acute demarcated cortical infarction. Patchy and ill-defined hypoattenuation within the bilateral cerebral white matter, progressed in the right centrum semiovale and corona radiata as compared to the prior head CT of 02/28/2010. Findings may reflect progressive chronic small vessel ischemic disease. However, given the provided history consider a brain MRI to exclude an acute white matter infarct at this site.  CTA head & neck - Negative CTA for large vessel occlusion. 70% atheromatous stenosis at the right carotid bulb/origin of the right ICA. 50% atheromatous stenosis at the left carotid bulb/origin of the left ICA. 5 mm left MCA bifurcation aneurysm.  MRI - Ordered  2D Echo - EF: 60-65% No wall motion  abnormalities.  LDL 20  HgbA1c 5.4  VTE prophylaxis - SCD's    Diet   Diet Heart Room service appropriate? Yes; Fluid consistency: Thin     heparin IV and Xarelto (rivaroxaban) daily prior to admission, now on Xarelto (rivaroxaban) daily.   Therapy recommendations:  Home Health PT  Disposition:  Pending medical clearance  Hypertension  Home meds:  Entresto, Spironolactone  Stable . Permissive hypertension (OK if < 220/120) but gradually normalize in 5-7 days . Long-term BP goal normotensive  Hyperlipidemia  Home meds:  none  LDL 20, goal < 70  Add Lipitor 80 mg daily   High intensity statin Lipitor 80 mg daily  Continue statin at discharge   Other Stroke Risk Factors  Advanced Age >/= 74   Former Cigarette smoker  Congestive heart failure  Other Active Problems  Short Bowel Syndrome  Hospital day # 0  Fatima Sanger MD Resident I have personally obtained history,examined this patient, reviewed notes, independently viewed imaging studies, participated in medical decision making and plan of care.ROS completed by me personally and pertinent positives fully documented  I have made any additions or clarifications directly to the above note. Agree with note above.  She presented with transient left-sided weakness and slurred speech likely from right hemispheric TIA versus small subcortical infarct probably from small vessel disease.  She was already on Eliquis prior to admission for unclear reason continue that  now.  Check MRI scan of the brain and aggressive risk factor modification.  Answer questions.  Discussed with Dr.Ghimire.  Greater than 50% time during this 35-minute visit spent on counseling and coordination of care about her TIA and discussion about stroke prevention and answering questions.  Antony Contras MD  Antony Contras, MD Medical Director [place] Pager: [phone] 07/24/2020 4:34 PM   To contact Stroke Continuity provider,  please refer to [email or web address] After hours, contact General Neurology

## 2020-07-24 NOTE — Progress Notes (Signed)
  Echocardiogram 2D Echocardiogram has been performed.  [name] [name] F 07/24/2020, 1:05 PM

## 2020-07-25 DIAGNOSIS — E559 Vitamin D deficiency, unspecified: Secondary | ICD-10-CM | POA: Diagnosis present

## 2020-07-25 DIAGNOSIS — I6389 Other cerebral infarction: Secondary | ICD-10-CM | POA: Diagnosis present

## 2020-07-25 DIAGNOSIS — Z86718 Personal history of other venous thrombosis and embolism: Secondary | ICD-10-CM | POA: Diagnosis not present

## 2020-07-25 DIAGNOSIS — L89151 Pressure ulcer of sacral region, stage 1: Secondary | ICD-10-CM | POA: Diagnosis present

## 2020-07-25 DIAGNOSIS — I639 Cerebral infarction, unspecified: Secondary | ICD-10-CM | POA: Diagnosis present

## 2020-07-25 DIAGNOSIS — N1831 Chronic kidney disease, stage 3a: Secondary | ICD-10-CM | POA: Diagnosis present

## 2020-07-25 DIAGNOSIS — I6300 Cerebral infarction due to thrombosis of unspecified precerebral artery: Secondary | ICD-10-CM | POA: Diagnosis not present

## 2020-07-25 DIAGNOSIS — Z8249 Family history of ischemic heart disease and other diseases of the circulatory system: Secondary | ICD-10-CM | POA: Diagnosis not present

## 2020-07-25 DIAGNOSIS — Z86010 Personal history of colonic polyps: Secondary | ICD-10-CM | POA: Diagnosis not present

## 2020-07-25 DIAGNOSIS — K912 Postsurgical malabsorption, not elsewhere classified: Secondary | ICD-10-CM

## 2020-07-25 DIAGNOSIS — D6859 Other primary thrombophilia: Secondary | ICD-10-CM | POA: Diagnosis present

## 2020-07-25 DIAGNOSIS — M810 Age-related osteoporosis without current pathological fracture: Secondary | ICD-10-CM | POA: Diagnosis present

## 2020-07-25 DIAGNOSIS — Z880 Allergy status to penicillin: Secondary | ICD-10-CM | POA: Diagnosis not present

## 2020-07-25 DIAGNOSIS — R29702 NIHSS score 2: Secondary | ICD-10-CM | POA: Diagnosis present

## 2020-07-25 DIAGNOSIS — E7140 Disorder of carnitine metabolism, unspecified: Secondary | ICD-10-CM | POA: Diagnosis present

## 2020-07-25 DIAGNOSIS — Z20822 Contact with and (suspected) exposure to covid-19: Secondary | ICD-10-CM | POA: Diagnosis present

## 2020-07-25 DIAGNOSIS — D638 Anemia in other chronic diseases classified elsewhere: Secondary | ICD-10-CM | POA: Diagnosis present

## 2020-07-25 DIAGNOSIS — Z7901 Long term (current) use of anticoagulants: Secondary | ICD-10-CM | POA: Diagnosis not present

## 2020-07-25 DIAGNOSIS — Z87891 Personal history of nicotine dependence: Secondary | ICD-10-CM | POA: Diagnosis not present

## 2020-07-25 DIAGNOSIS — E162 Hypoglycemia, unspecified: Secondary | ICD-10-CM | POA: Diagnosis present

## 2020-07-25 DIAGNOSIS — R29703 NIHSS score 3: Secondary | ICD-10-CM | POA: Diagnosis not present

## 2020-07-25 DIAGNOSIS — Z79899 Other long term (current) drug therapy: Secondary | ICD-10-CM | POA: Diagnosis not present

## 2020-07-25 DIAGNOSIS — I5042 Chronic combined systolic (congestive) and diastolic (congestive) heart failure: Secondary | ICD-10-CM | POA: Diagnosis present

## 2020-07-25 DIAGNOSIS — G8194 Hemiplegia, unspecified affecting left nondominant side: Secondary | ICD-10-CM | POA: Diagnosis present

## 2020-07-25 DIAGNOSIS — I130 Hypertensive heart and chronic kidney disease with heart failure and stage 1 through stage 4 chronic kidney disease, or unspecified chronic kidney disease: Secondary | ICD-10-CM | POA: Diagnosis present

## 2020-07-25 DIAGNOSIS — D696 Thrombocytopenia, unspecified: Secondary | ICD-10-CM | POA: Diagnosis present

## 2020-07-25 HISTORY — DX: Cerebral infarction, unspecified: I63.9

## 2020-07-25 LAB — BASIC METABOLIC PANEL
Anion gap: 6 (ref 5–15)
BUN: 7 mg/dL — ABNORMAL LOW (ref 8–23)
CO2: 14 mmol/L — ABNORMAL LOW (ref 22–32)
Calcium: 8.1 mg/dL — ABNORMAL LOW (ref 8.9–10.3)
Chloride: 116 mmol/L — ABNORMAL HIGH (ref 98–111)
Creatinine, Ser: 1.34 mg/dL — ABNORMAL HIGH (ref 0.44–1.00)
GFR, Estimated: 43 mL/min — ABNORMAL LOW (ref >60)
Glucose, Bld: 85 mg/dL (ref 70–99)
Potassium: 3.1 mmol/L — ABNORMAL LOW (ref 3.5–5.1)
Sodium: 136 mmol/L (ref 135–145)

## 2020-07-25 LAB — MAGNESIUM: Magnesium: 2 mg/dL (ref 1.7–2.4)

## 2020-07-25 LAB — PHOSPHORUS: Phosphorus: 3.5 mg/dL (ref 2.5–4.6)

## 2020-07-25 MED ORDER — CLINIMIX/DEXTROSE (5/20) 5 % IV SOLN
INTRAVENOUS | Status: DC
  Filled 2020-07-25: qty 2000

## 2020-07-25 MED ORDER — POTASSIUM CHLORIDE 10 MEQ/100ML IV SOLN
10.0000 meq | INTRAVENOUS | Status: AC
  Administered 2020-07-25 (×2): 10 meq via INTRAVENOUS
  Filled 2020-07-25: qty 100

## 2020-07-25 MED ORDER — POTASSIUM CHLORIDE 10 MEQ/100ML IV SOLN
10.0000 meq | INTRAVENOUS | Status: DC
Start: 2020-07-25 — End: 2020-07-25
  Filled 2020-07-25: qty 100

## 2020-07-25 MED ORDER — SODIUM CHLORIDE 0.45 % IV SOLN
INTRAVENOUS | Status: DC
  Filled 2020-07-25: qty 1000

## 2020-07-25 NOTE — Progress Notes (Signed)
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  She had decreased grip on the left arm.  Admitted to the hospital with right hemispheric TIA/acute a stroke.   Assessment & Plan:   Principal Problem:   Cerebrovascular accident (CVA) ([place]) Active Problems:   Anemia of chronic disease   Acquired short bowel syndrome   Chronic systolic heart failure (HCC)   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HCC)   Thrombocytopenia (HCC)   CKD (chronic kidney disease), stage III (HCC)   Pressure injury of skin  Acute ischemic stroke: Clinical findings, garbled speech, left arm and leg weakness that is gradually improving. CT head findings, no acute findings.  Progressive chronic small vessel ischemic disease. MRI of the brain, numerous small scattered acute cortical and subcortical infarct within the right cerebral hemisphere, right MCA territory as well as ACA and PCA territory. CTA of the head and neck, no large vessel occlusion.  70% atheromatous stenosis at the right carotid bulb.  50% stenosis at the left carotid bulb. 2D echocardiogram, normal ejection fraction.  Grade 2 diastolic dysfunction. Antiplatelet therapy, on Xarelto at home continued. LDL 20.  Too low. Hemoglobin A1c 5.4. DVT prophylaxis, SCDs.  Xarelto. Therapy recommendations, home health PT OT. Neurology recommended transcranial doppler, pending.  Short gut syndrome, hypoglycemia, long-term TPN and IV fluid management: Allow regular diet.  Lomotil as needed.  On anticipation of discharge, TPN was not started.  Will replace electrolytes  aggressively.  We will keep patient on maintenance dextrose normal saline.  Replace potassium and magnesium.  History of ischemic bowel/hypercoagulable state/ right IJ clot: Chronically anticoagulated on Xarelto. Continue   Continue to work with PT OT.  Stroke work-up completion pending.  DVT prophylaxis:  rivaroxaban (XARELTO) tablet 20 mg   Code Status: Full code Family Communication: Patient's husband and daughter at the bedside Disposition Plan: Status is: Observation  The patient will require care spanning > 2 midnights and should be moved to inpatient because: Inpatient level of care appropriate due to severity of illness  Dispo: The patient is from: Home              Anticipated d/c is to: Home              Patient currently is not medically stable to d/c.   Difficult to place patient No         Consultants:   Neurology  Procedures:   None  Antimicrobials:   None   Subjective: Patient seen and examined.  Patient's husband and daughter were at the bedside.  Patient herself denied any complaints.  Her grip on the left side is slightly improving but not to the baseline.  Continues to have intermittent diarrhea.  No other overnight events.  Eager to go home but realizes that a stroke work-up was not completed.  Objective: Vitals:   07/25/20 0005 07/25/20 0408 07/25/20 0725 07/25/20 1112  BP: (!) 100/47 (!) 105/53 (!) 107/50 (!) 116/54  Pulse: (!) 55 (!) 58 62 (!) 59  Resp: 18  20 16   Temp: 98 F (36.7 C) 98.1 F (36.7 C) (!) 100.9  F (38.3 C) 98.5 F (36.9 C)  TempSrc: Oral Oral Oral Oral  SpO2: 96% 98% 96% 97%  Weight:      Height:        Intake/Output Summary (Last 24 hours) at 07/25/2020 1533 Last data filed at 07/25/2020 0300 Gross per 24 hour  Intake 648.11 ml  Output -  Net 648.11 ml   Filed Weights   07/23/20 1801 07/24/20 0354  Weight: 64.4 kg 65.9 kg    Examination:  General exam: Appears calm and comfortable  Respiratory system:  Clear to auscultation. Respiratory effort normal. Cardiovascular system: S1 & S2 heard, RRR. No JVD, murmurs, rubs, gallops or clicks. No pedal edema. Patient has left IJ tunneled catheter present. Gastrointestinal system: Abdomen is nondistended, soft and nontender. No organomegaly or masses felt. Normal bowel sounds heard. Central nervous system: Alert and oriented.  Extremities: Symmetric 5 x 5 power on right side. Left upper and lower extremity 4/5. Skin: No rashes, lesions or ulcers Psychiatry: Judgement and insight appear normal. Mood & affect appropriate.     Data Reviewed: I have personally reviewed following labs and imaging studies  CBC: Recent Labs  Lab 07/23/20 1920  WBC 10.7*  NEUTROABS 9.0*  HGB 9.3*  HCT 29.3*  MCV 100.0  PLT 606*   Basic Metabolic Panel: Recent Labs  Lab 07/23/20 1920 07/24/20 0812 07/25/20 0415  NA 133* 135 136  K 3.0* 3.0* 3.1*  CL 112* 115* 116*  CO2 14* 14* 14*  GLUCOSE 81 83 85  BUN 10 10 7*  CREATININE 1.26* 1.32* 1.34*  CALCIUM 7.9* 8.0* 8.1*  MG 1.7  --  2.0  PHOS  --   --  3.5   GFR: Estimated Creatinine Clearance: 39.4 mL/min (A) (by C-G formula based on SCr of 1.34 mg/dL (H)). Liver Function Tests: Recent Labs  Lab 07/23/20 1920 07/24/20 0812  AST 19 16  ALT 15 15  ALKPHOS 94 91  BILITOT 0.3 0.8  PROT 7.2 6.8  ALBUMIN 2.9* 2.5*   No results for input(s): LIPASE, AMYLASE in the last 168 hours. No results for input(s): AMMONIA in the last 168 hours. Coagulation Profile: Recent Labs  Lab 07/23/20 1920  INR 1.8*   Cardiac Enzymes: No results for input(s): CKTOTAL, CKMB, CKMBINDEX, TROPONINI in the last 168 hours. BNP (last 3 results) No results for input(s): PROBNP in the last 8760 hours. HbA1C: Recent Labs    07/24/20 0501  HGBA1C 5.4   CBG: Recent Labs  Lab 07/24/20 0809 07/24/20 1205 07/24/20 1435 07/24/20 1501 07/24/20 1657  GLUCAP 72 70 61* 64* 62*   Lipid Profile: Recent Labs     07/24/20 0501  CHOL 72  HDL 17*  LDLCALC 20  TRIG 175*  CHOLHDL 4.2   Thyroid Function Tests: No results for input(s): TSH, T4TOTAL, FREET4, T3FREE, THYROIDAB in the last 72 hours. Anemia Panel: No results for input(s): VITAMINB12, FOLATE, FERRITIN, TIBC, IRON, RETICCTPCT in the last 72 hours. Sepsis Labs: No results for input(s): PROCALCITON, LATICACIDVEN in the last 168 hours.  Recent Results (from the past 240 hour(s))  Resp Panel by RT-PCR (Flu A&B, Covid) Nasopharyngeal Swab     Status: None   Collection Time: 07/23/20  7:20 PM   Specimen: Nasopharyngeal Swab; Nasopharyngeal(NP) swabs in vial transport medium  Result Value Ref Range Status   SARS Coronavirus 2 by RT PCR NEGATIVE NEGATIVE Final    Comment: (NOTE) SARS-CoV-2 target nucleic acids are NOT DETECTED.  The SARS-CoV-2 RNA is generally detectable  in upper respiratory specimens during the acute phase of infection. The lowest concentration of SARS-CoV-2 viral copies this assay can detect is 138 copies/mL. A negative result does not preclude SARS-Cov-2 infection and should not be used as the sole basis for treatment or other patient management decisions. A negative result may occur with  improper specimen collection/handling, submission of specimen other than nasopharyngeal swab, presence of viral mutation(s) within the areas targeted by this assay, and inadequate number of viral copies(<138 copies/mL). A negative result must be combined with clinical observations, patient history, and epidemiological information. The expected result is Negative.  Fact Sheet for Patients:  EntrepreneurPulse.com.au  Fact Sheet for Healthcare Providers:  IncredibleEmployment.be  This test is no t yet approved or cleared by the [place] FDA and  has been authorized for detection and/or diagnosis of SARS-CoV-2 by FDA under an Emergency Use Authorization (EUA). This EUA will remain  in effect  (meaning this test can be used) for the duration of the COVID-19 declaration under Section 564(b)(1) of the Act, 21 U.S.C.section 360bbb-3(b)(1), unless the authorization is terminated  or revoked sooner.       Influenza A by PCR NEGATIVE NEGATIVE Final   Influenza B by PCR NEGATIVE NEGATIVE Final    Comment: (NOTE) The Xpert Xpress SARS-CoV-2/FLU/RSV plus assay is intended as an aid in the diagnosis of influenza from Nasopharyngeal swab specimens and should not be used as a sole basis for treatment. Nasal washings and aspirates are unacceptable for Xpert Xpress SARS-CoV-2/FLU/RSV testing.  Fact Sheet for Patients: EntrepreneurPulse.com.au  Fact Sheet for Healthcare Providers: IncredibleEmployment.be  This test is not yet approved or cleared by the [place] FDA and has been authorized for detection and/or diagnosis of SARS-CoV-2 by FDA under an Emergency Use Authorization (EUA). This EUA will remain in effect (meaning this test can be used) for the duration of the COVID-19 declaration under Section 564(b)(1) of the Act, 21 U.S.C. section 360bbb-3(b)(1), unless the authorization is terminated or revoked.  Performed at [facility], [address], [place], [place] [address]          Radiology Studies: CT Angio Head W or Wo Contrast  Result Date: 07/23/2020 CLINICAL DATA:  Initial evaluation for acute stroke, left-sided weakness with slurred speech. EXAM: CT ANGIOGRAPHY HEAD AND NECK TECHNIQUE: Multidetector CT imaging of the head and neck was performed using the standard protocol during bolus administration of intravenous contrast. Multiplanar CT image reconstructions and MIPs were obtained to evaluate the vascular anatomy. Carotid stenosis measurements (when applicable) are obtained utilizing NASCET criteria, using the distal internal carotid diameter as the denominator. CONTRAST:  27mL OMNIPAQUE IOHEXOL 350 MG/ML SOLN  COMPARISON:  Prior head CT from earlier the same day. FINDINGS: CTA NECK FINDINGS Aortic arch: Visualized aortic arch of normal caliber with normal branch pattern. Mild-to-moderate atheromatous change about the arch and origin of the great vessels without hemodynamically significant stenosis. Visualized subclavian arteries patent. Right carotid system: Right CCA patent from its origin to the bifurcation without stenosis. Eccentric calcified plaque at the right carotid bulb/proximal right ICA with associated stenosis of up to 70% by NASCET criteria. Right ICA patent distally without stenosis, dissection or occlusion. Left carotid system: Left CCA patent from its origin to the bifurcation without stenosis. Scattered calcified plaque about the left carotid bulb/proximal left ICA with associated stenosis of up to 50% by NASCET criteria. Left ICA patent distally without stenosis, dissection or occlusion. Vertebral arteries: Both vertebral arteries arise from the subclavian arteries.  Vertebral arteries patent within the neck without stenosis, dissection, or occlusion. Skeleton: No visible acute osseous finding. No discrete or worrisome osseous lesions. Other neck: No other acute soft tissue abnormality within the neck. No mass or adenopathy. Upper chest: Visualized upper chest demonstrates no acute finding. Review of the MIP images confirms the above findings CTA HEAD FINDINGS Anterior circulation: Petrous segments widely patent. Scattered atheromatous plaque within the carotid siphons with no more than mild multifocal stenosis. A1 segments patent bilaterally. Normal anterior communicating artery complex. Anterior cerebral arteries patent to their distal aspects without stenosis. No M1 stenosis or occlusion. 5 mm aneurysm noted at the left MCA bifurcation (series 7, image 262). Right MCA bifurcation within normal limits. Distal MCA branches well perfused and symmetric. Posterior circulation: Both V4 segments patent to the  vertebrobasilar junction without significant stenosis. Left vertebral artery slightly dominant. Both PICA origins patent and normal. Basilar patent to its distal aspect without significant stenosis. Superior cerebellar arteries patent bilaterally. Both PCAs well perfused to their distal aspects without stenosis. Small bilateral posterior communicating arteries noted. Venous sinuses: Grossly patent allowing for timing the contrast bolus. Anatomic variants: None significant. Review of the MIP images confirms the above findings IMPRESSION: 1. Negative CTA for large vessel occlusion. 2. 70% atheromatous stenosis at the right carotid bulb/origin of the right ICA. 3. 50% atheromatous stenosis at the left carotid bulb/origin of the left ICA. 4. 5 mm left MCA bifurcation aneurysm. Electronically Signed   By: Jeannine Boga M.D.   On: 07/23/2020 21:55   CT HEAD WO CONTRAST  Result Date: 07/23/2020 CLINICAL DATA:  Neuro deficit, acute, stroke suspected. Additional history provided: Left-sided weakness in arm and leg. Slurred speech. EXAM: CT HEAD WITHOUT CONTRAST TECHNIQUE: Contiguous axial images were obtained from the base of the skull through the vertex without intravenous contrast. COMPARISON:  Head CT 02/28/2010. FINDINGS: Brain: Mild cerebral and cerebellar atrophy. Redemonstrated prominent perivascular spaces within the inferior basal ganglia bilaterally. Patchy ill-defined hypoattenuation within the cerebral white matter, progressed in the right centrum semiovale and corona radiata as compared to the prior head CT of 02/28/2010. There is no acute intracranial hemorrhage. No demarcated cortical infarct. No extra-axial fluid collection. No evidence of intracranial mass. No midline shift. Vascular: No hyperdense vessel.  Atherosclerotic calcifications. Skull: Normal. Negative for fracture or focal lesion. Sinuses/Orbits: Visualized orbits show no acute finding. Trace scattered paranasal sinus mucosal thickening  at the imaged levels. IMPRESSION: No acute intracranial hemorrhage or acute demarcated cortical infarction. Patchy and ill-defined hypoattenuation within the bilateral cerebral white matter, progressed in the right centrum semiovale and corona radiata as compared to the prior head CT of 02/28/2010. Findings may reflect progressive chronic small vessel ischemic disease. However, given the provided history consider a brain MRI to exclude an acute white matter infarct at this site. Minimal paranasal sinus mucosal thickening at the imaged levels. Electronically Signed   By: Kellie Simmering DO   On: 07/23/2020 19:16   CT Angio Neck W and/or Wo Contrast  Result Date: 07/23/2020 CLINICAL DATA:  Initial evaluation for acute stroke, left-sided weakness with slurred speech. EXAM: CT ANGIOGRAPHY HEAD AND NECK TECHNIQUE: Multidetector CT imaging of the head and neck was performed using the standard protocol during bolus administration of intravenous contrast. Multiplanar CT image reconstructions and MIPs were obtained to evaluate the vascular anatomy. Carotid stenosis measurements (when applicable) are obtained utilizing NASCET criteria, using the distal internal carotid diameter as the denominator. CONTRAST:  42mL OMNIPAQUE IOHEXOL 350 MG/ML SOLN COMPARISON:  Prior head CT from earlier the same day. FINDINGS: CTA NECK FINDINGS Aortic arch: Visualized aortic arch of normal caliber with normal branch pattern. Mild-to-moderate atheromatous change about the arch and origin of the great vessels without hemodynamically significant stenosis. Visualized subclavian arteries patent. Right carotid system: Right CCA patent from its origin to the bifurcation without stenosis. Eccentric calcified plaque at the right carotid bulb/proximal right ICA with associated stenosis of up to 70% by NASCET criteria. Right ICA patent distally without stenosis, dissection or occlusion. Left carotid system: Left CCA patent from its origin to the  bifurcation without stenosis. Scattered calcified plaque about the left carotid bulb/proximal left ICA with associated stenosis of up to 50% by NASCET criteria. Left ICA patent distally without stenosis, dissection or occlusion. Vertebral arteries: Both vertebral arteries arise from the subclavian arteries. Vertebral arteries patent within the neck without stenosis, dissection, or occlusion. Skeleton: No visible acute osseous finding. No discrete or worrisome osseous lesions. Other neck: No other acute soft tissue abnormality within the neck. No mass or adenopathy. Upper chest: Visualized upper chest demonstrates no acute finding. Review of the MIP images confirms the above findings CTA HEAD FINDINGS Anterior circulation: Petrous segments widely patent. Scattered atheromatous plaque within the carotid siphons with no more than mild multifocal stenosis. A1 segments patent bilaterally. Normal anterior communicating artery complex. Anterior cerebral arteries patent to their distal aspects without stenosis. No M1 stenosis or occlusion. 5 mm aneurysm noted at the left MCA bifurcation (series 7, image 262). Right MCA bifurcation within normal limits. Distal MCA branches well perfused and symmetric. Posterior circulation: Both V4 segments patent to the vertebrobasilar junction without significant stenosis. Left vertebral artery slightly dominant. Both PICA origins patent and normal. Basilar patent to its distal aspect without significant stenosis. Superior cerebellar arteries patent bilaterally. Both PCAs well perfused to their distal aspects without stenosis. Small bilateral posterior communicating arteries noted. Venous sinuses: Grossly patent allowing for timing the contrast bolus. Anatomic variants: None significant. Review of the MIP images confirms the above findings IMPRESSION: 1. Negative CTA for large vessel occlusion. 2. 70% atheromatous stenosis at the right carotid bulb/origin of the right ICA. 3. 50%  atheromatous stenosis at the left carotid bulb/origin of the left ICA. 4. 5 mm left MCA bifurcation aneurysm. Electronically Signed   By: Jeannine Boga M.D.   On: 07/23/2020 21:55   MR BRAIN WO CONTRAST  Result Date: 07/24/2020 CLINICAL DATA:  Neuro deficit, acute, stroke suspected. EXAM: MRI HEAD WITHOUT CONTRAST TECHNIQUE: Multiplanar, multiecho pulse sequences of the brain and surrounding structures were obtained without intravenous contrast. COMPARISON:  CT angiogram head/neck 07/23/2020. Noncontrast head CT 07/23/2020. FINDINGS: Brain: Mild intermittent motion degradation, limiting evaluation. Mild cerebral atrophy. There are numerous small acute cortical/subcortical infarcts within the right cerebral hemisphere, predominantly within the right MCA territory, as well as right MCA/ACA and right MCA/PCA watershed territories. Infarcts are present within the right frontal lobe, parietal lobe, temporal lobe and temporal occipital junction. Additionally, there are several scattered tiny acute infarcts within the left frontal white matter within the left MCA/ACA watershed territory. Background moderate multifocal T2/FLAIR hyperintensity within the cerebral white matter which is nonspecific, but compatible with chronic small vessel ischemic disease. This includes several chronic lacunar infarcts within the bilateral frontal lobe white matter. Nonspecific chronic microhemorrhage within the posterior left temporal lobe (series 7, image 40) No evidence of intracranial mass. No extra-axial fluid collection. No midline shift. Partially empty sella turcica. Vascular: Expected proximal arterial flow voids. Skull and upper cervical  spine: No focal marrow lesion. Incompletely assessed upper cervical spondylosis. Sinuses/Orbits: Visualized orbits show no acute finding. Mild mucosal thickening within the bilateral ethmoid and maxillary sinuses. IMPRESSION: Numerous small scattered acute cortical/subcortical infarcts  within the right cerebral hemisphere, predominantly within the right MCA territory, as well as right MCA/ACA and right MCA/PCA watershed territories. Infarcts are present within the right frontal lobe, parietal lobe, temporal lobe and temporal occipital junction. Additionally, there are several scattered tiny acute infarcts within the left frontal white matter within the left MCA/ACA watershed territory. The pattern of the above described infarcts is suggestive of an embolic process and clinical correlation is recommended. Background mild cerebral atrophy and mild-to-moderate chronic small vessel ischemic disease. Electronically Signed   By: Kellie Simmering DO   On: 07/24/2020 18:54   ECHOCARDIOGRAM COMPLETE  Result Date: 07/24/2020    ECHOCARDIOGRAM REPORT   Patient Name:   [name] [name] Date of Exam: 07/24/2020 Medical Rec #:  [id number]    Height:       68.0 in Accession #:    [id number]   Weight:       145.2 lb Date of Birth:  1951-05-06   BSA:          1.784 m Patient Age:    83 years     BP:           92/49 mmHg Patient Gender: F            HR:           69 bpm. Exam Location:  Inpatient Procedure: 2D Echo, Cardiac Doppler, Color Doppler and Intracardiac            Opacification Agent Indications:    Stroke  History:        Patient has prior history of Echocardiogram examinations, most                 recent 03/14/2020.  Sonographer:    Merrie Roof RDCS Referring Phys: [id number] [facility]  Sonographer Comments: Technically difficult IMPRESSIONS  1. Left ventricular ejection fraction, by estimation, is 60 to 65%. The left ventricle has normal function. The left ventricle has no regional wall motion abnormalities. Left ventricular diastolic parameters are consistent with Grade II diastolic dysfunction (pseudonormalization). Elevated left atrial pressure.  2. Right ventricular systolic function is normal. The right ventricular size is normal.  3. The mitral valve is normal in structure. No evidence of mitral  valve regurgitation. No evidence of mitral stenosis.  4. The aortic valve is normal in structure. There is moderate calcification of the aortic valve. There is moderate thickening of the aortic valve. Aortic valve regurgitation is not visualized. No aortic stenosis is present.  5. The inferior vena cava is normal in size with greater than 50% respiratory variability, suggesting right atrial pressure of 3 mmHg. FINDINGS  Left Ventricle: Left ventricular ejection fraction, by estimation, is 60 to 65%. The left ventricle has normal function. The left ventricle has no regional wall motion abnormalities. The left ventricular internal cavity size was normal in size. There is  no left ventricular hypertrophy. Left ventricular diastolic parameters are consistent with Grade II diastolic dysfunction (pseudonormalization). Elevated left atrial pressure. Right Ventricle: The right ventricular size is normal. No increase in right ventricular wall thickness. Right ventricular systolic function is normal. Left Atrium: Left atrial size was normal in size. Right Atrium: Right atrial size was normal in size. Pericardium: There is no evidence of pericardial effusion. Mitral Valve: The mitral  valve is normal in structure. No evidence of mitral valve regurgitation. No evidence of mitral valve stenosis. Tricuspid Valve: The tricuspid valve is normal in structure. Tricuspid valve regurgitation is not demonstrated. No evidence of tricuspid stenosis. Aortic Valve: The aortic valve is normal in structure. There is moderate calcification of the aortic valve. There is moderate thickening of the aortic valve. Aortic valve regurgitation is not visualized. No aortic stenosis is present. Aortic valve mean gradient measures 15.0 mmHg. Aortic valve peak gradient measures 24.4 mmHg. Aortic valve area, by VTI measures 1.18 cm. Pulmonic Valve: The pulmonic valve was normal in structure. Pulmonic valve regurgitation is not visualized. No evidence of  pulmonic stenosis. Aorta: The aortic root is normal in size and structure. Venous: The inferior vena cava is normal in size with greater than 50% respiratory variability, suggesting right atrial pressure of 3 mmHg. IAS/Shunts: No atrial level shunt detected by color flow Doppler.  LEFT VENTRICLE PLAX 2D LVIDd:         4.70 cm      Diastology LVIDs:         3.30 cm      LV e' medial:    3.81 cm/s LV PW:         1.20 cm      LV E/e' medial:  21.3 LV IVS:        1.30 cm      LV e' lateral:   5.00 cm/s LVOT diam:     2.25 cm      LV E/e' lateral: 16.2 LV SV:         63 LV SV Index:   35 LVOT Area:     3.98 cm  LV Volumes (MOD) LV vol d, MOD A2C: 250.0 ml LV vol d, MOD A4C: 185.0 ml LV vol s, MOD A2C: 137.0 ml LV vol s, MOD A4C: 105.0 ml LV SV MOD A2C:     113.0 ml LV SV MOD A4C:     185.0 ml LV SV MOD BP:      102.4 ml LEFT ATRIUM             Index       RIGHT ATRIUM           Index LA diam:        2.80 cm 1.57 cm/m  RA Area:     16.00 cm LA Vol (A2C):   88.4 ml 49.56 ml/m RA Volume:   32.50 ml  18.22 ml/m LA Vol (A4C):   94.1 ml 52.76 ml/m LA Biplane Vol: 95.4 ml 53.48 ml/m  AORTIC VALVE AV Area (Vmax):    1.32 cm AV Area (Vmean):   1.20 cm AV Area (VTI):     1.18 cm AV Vmax:           247.00 cm/s AV Vmean:          186.000 cm/s AV VTI:            0.535 m AV Peak Grad:      24.4 mmHg AV Mean Grad:      15.0 mmHg LVOT Vmax:         81.84 cm/s LVOT Vmean:        56.223 cm/s LVOT VTI:          0.159 m LVOT/AV VTI ratio: 0.30  AORTA Ao Root diam: 3.00 cm MITRAL VALVE MV Area (PHT): 2.24 cm    SHUNTS MV Decel Time: 338 msec    Systemic  VTI:  0.16 m MV E velocity: 81.00 cm/s  Systemic Diam: 2.25 cm MV A velocity: 94.30 cm/s MV E/A ratio:  0.86 Mihai Croitoru MD Electronically signed by Sanda Klein MD Signature Date/Time: 07/24/2020/1:57:35 PM    Final         Scheduled Meds: . atorvastatin  80 mg Oral Daily  . pantoprazole  40 mg Oral Daily  . potassium chloride  40 mEq Oral BID  . rivaroxaban  20 mg  Oral Q supper  . sacubitril-valsartan  1 tablet Oral BID  . spironolactone  25 mg Oral Daily   Continuous Infusions: . dextrose 5 % and 0.45% NaCl 50 mL/hr at 07/25/20 1518  . potassium chloride 10 mEq (07/25/20 1309)     LOS: 0 days    Time spent: 25 minutes    Barb Merino, MD [facility] Pager [phone]

## 2020-07-25 NOTE — Evaluation (Signed)
Occupational Therapy Evaluation Patient Details Name: [name] [name] MRN: [id number] DOB: November 21, 1950 Today's Date: 07/25/2020    History of Present Illness [name] [name] is a 70 yo female who presents with L UE and LE weakness and slurred speech. CT neg, awaiting MRI.  PMH:  hypercoagulable state, history of ischemic gut status post resection, short gut syndrome, anemia of chronic disease, chronic diastolic CHF, carotid stenosis, atherosclerosis.   Clinical Impression   PTA patient independent and driving. Admitted for above and limited by problem list below, including L sided weakness and decreased coordination, impaired balance, and decreased activity tolerance. Patient demonstrates difficulty dual cognitive tasks attention, verbal cueing required to attend to multiple tasks a time, but functional.  Completing ADLs with up to min guard assist, transfers and mobility using cane with min guard assist.  She voices understanding of recommendations to have assist with med mgmt, IADLs and driving at time of dc, and will have support from spouse and family.  Based on performance today, recommend continued OT services acutely and after dc at outpatient neuro OT.  Will follow.     Follow Up Recommendations  Outpatient OT;Supervision - Intermittent    Equipment Recommendations  None recommended by OT    Recommendations for Other Services       Precautions / Restrictions Precautions Precautions: Fall Restrictions Weight Bearing Restrictions: No      Mobility Bed Mobility Overal bed mobility: Modified Independent             General bed mobility comments: no assist needed to come to EOB or return to supine, has to lift L LE back into bed due to fatigue    Transfers Overall transfer level: Needs assistance Equipment used: Straight cane Transfers: Sit to/from Stand Sit to Stand: Min guard         General transfer comment: min guard for safety and mild unsteadiness    Balance  Overall balance assessment: Needs assistance Sitting-balance support: No upper extremity supported;Feet supported Sitting balance-Leahy Scale: Good     Standing balance support: Single extremity supported;No upper extremity supported Standing balance-Leahy Scale: Fair                             ADL either performed or assessed with clinical judgement   ADL Overall ADL's : Needs assistance/impaired     Grooming: Min guard;Standing   Upper Body Bathing: Set up;Sitting   Lower Body Bathing: Min guard;Sit to/from stand   Upper Body Dressing : Set up;Supervision/safety;Sitting   Lower Body Dressing: Min guard;Sit to/from stand   Toilet Transfer: Min guard;Ambulation (cane)           Functional mobility during ADLs: Min guard;Cane General ADL Comments: pt limited by L weakness and coordination, balance     Vision Baseline Vision/History: Wears glasses Wears Glasses: Reading only Patient Visual Report: No change from baseline Vision Assessment?: No apparent visual deficits     Perception     Praxis      Pertinent Vitals/Pain Pain Assessment: No/denies pain     Hand Dominance Right   Extremity/Trunk Assessment Upper Extremity Assessment Upper Extremity Assessment: LUE deficits/detail LUE Deficits / Details: grossly 3+/5, decreased FMC, poor coordination and strength LUE Sensation: decreased proprioception LUE Coordination: decreased fine motor;decreased gross motor   Lower Extremity Assessment Lower Extremity Assessment: Defer to PT evaluation   Cervical / Trunk Assessment Cervical / Trunk Assessment: Normal   Communication Communication Communication: No difficulties  Cognition Arousal/Alertness: Awake/alert Behavior During Therapy: WFL for tasks assessed/performed Overall Cognitive Status: Within Functional Limits for tasks assessed                                 General Comments: some diffficulty with attending to dual cog  tasks, but functional.   General Comments  discussed safety, fall prevention and maximal use of L UE    Exercises Exercises: Other exercises Other Exercises Other Exercises: provided HEP for [facility] and reviewed exercises to L UE   Shoulder Instructions      Home Living Family/patient expects to be discharged to:: Private residence Living Arrangements: Spouse/significant other Available Help at Discharge: Family;Available 24 hours/day Type of Home: House Home Access: Stairs to enter [employer] of Steps: 3 Entrance Stairs-Rails: None Home Layout: Two level;Able to live on main level with bedroom/bathroom Alternate Level Stairs-Number of Steps: don't use   Bathroom Shower/Tub: Occupational psychologist: Standard Bathroom Accessibility: Yes   Home Equipment: Toilet riser;Walker - 2 wheels;Bedside commode;Shower seat;Grab bars - tub/shower          Prior Functioning/Environment Level of Independence: Independent        Comments: drives        OT Problem List: Decreased strength;Decreased activity tolerance;Impaired balance (sitting and/or standing);Decreased coordination;Decreased cognition;Decreased knowledge of use of DME or AE;Decreased knowledge of precautions;Impaired UE functional use;Impaired sensation      OT Treatment/Interventions: Self-care/ADL training;Neuromuscular education;DME and/or AE instruction;Therapeutic activities;Cognitive remediation/compensation;Patient/family education;Balance training    OT Goals(Current goals can be found in the care plan section) Acute Rehab OT Goals Patient Stated Goal: home OT Goal Formulation: With patient Time For Goal Achievement: 08/08/20 Potential to Achieve Goals: Good  OT Frequency: Min 2X/week   Barriers to D/C:            Co-evaluation              AM-PAC OT "6 Clicks" Daily Activity     Outcome Measure Help from another person eating meals?: None Help from another person taking  care of personal grooming?: A Little Help from another person toileting, which includes using toliet, bedpan, or urinal?: A Little Help from another person bathing (including washing, rinsing, drying)?: A Little Help from another person to put on and taking off regular upper body clothing?: A Little Help from another person to put on and taking off regular lower body clothing?: A Little 6 Click Score: 19   End of Session Equipment Utilized During Treatment: Other (comment) (cane) Nurse Communication: Mobility status  Activity Tolerance: Patient tolerated treatment well Patient left: in bed;with call bell/phone within reach;with bed alarm set  OT Visit Diagnosis: Unsteadiness on feet (R26.81);Other symptoms and signs involving the nervous system (R29.898);Hemiplegia and hemiparesis Hemiplegia - Right/Left: Left Hemiplegia - dominant/non-dominant: Non-Dominant Hemiplegia - caused by: Cerebral infarction                Time: 2637-8588 OT Time Calculation (min): 24 min Charges:  OT General Charges $OT Visit: 1 Visit OT Evaluation $OT Eval Low Complexity: 1 Low OT Treatments $Neuromuscular Re-education: 8-22 mins  Jolaine Artist, OT Acute Rehabilitation Services Pager [phone] Office [phone]   Delight Stare 07/25/2020, 11:11 AM

## 2020-07-25 NOTE — Plan of Care (Signed)

## 2020-07-25 NOTE — Progress Notes (Signed)
PHARMACY - TOTAL PARENTERAL NUTRITION CONSULT NOTE   Indication: Short bowel syndrome  Patient Measurements: Height: 5\' 8"  (172.7 cm) Weight: 65.9 kg (145 lb 3.2 oz) IBW/kg (Calculated) : 63.9 TPN AdjBW (KG): 65.9 Body mass index is 22.08 kg/m.  Assessment: 42 yof on TPN pta for short gut syndrome presenting with acute ischemic stroke. Pharmacy consulted to dose TPN inpatient. Patient reports she does TPN 4 days per week and would have started back on 3/15 this week typically. She follows with [facility] [phone]) for her home TPN.  Potassium low at 3.1 today - already replaced per MD.  Plan:  -Custom compounded TPN orders to start tomorrow, as order placed after cut-off time for today. TPN labs and dietitian consult ordered -Per MD request Sloan Leiter), start patient on pre-made TPN bag overnight, as patient wants to avoid missing further TPN doses. Patient's TPN bag at home is 1800 ml total, run over 10 hrs with her pump per patient's daughter -As discussed with MD, will start Clinimix 5/20 bag (as this is the only item in stock currently) at 75 ml/hr and run until new compounded TPN bag starts tomorrow at standard 1800 hang time -F/u with [facility] tomorrow for full TPN home formula   Arturo Morton, PharmD, BCPS Please check AMION for all [facility] contact numbers Clinical Pharmacist 07/25/2020 7:13 PM

## 2020-07-25 NOTE — Progress Notes (Signed)
Physical Therapy Treatment Patient Details Name: [name] [name] MRN: [id number] DOB: 12-Mar-1951 Today's Date: 07/25/2020    History of Present Illness Michalla Brownstein is a 70 yo female who presents with L UE and LE weakness and slurred speech. CT neg, awaiting MRI.  PMH:  hypercoagulable state, history of ischemic gut status post resection, short gut syndrome, anemia of chronic disease, chronic diastolic CHF, carotid stenosis, atherosclerosis.    PT Comments    Patient progressing towards physical therapy goals. Ambulated 150' with SPC and min guard, at times husband on opposite side providing HHAx1. Patient negotiated 6 stairs initially with B handrails, however simulated home setup with HHAx2 and supervision. Educated patient on progressive walking program to build endurance and strength. Continue to recommend HHPT following discharge to maximize functional mobility and safety.    Follow Up Recommendations  Home health PT;Supervision for mobility/OOB     Equipment Recommendations  Cane    Recommendations for Other Services       Precautions / Restrictions Precautions Precautions: Fall Restrictions Weight Bearing Restrictions: No    Mobility  Bed Mobility Overal bed mobility: Modified Independent             General bed mobility comments: no assist needed to come to EOB or return to supine, has to lift L LE back into bed due to fatigue    Transfers Overall transfer level: Needs assistance Equipment used: Straight cane Transfers: Sit to/from Stand Sit to Stand: Supervision         General transfer comment: supervision for safety, patient able to self correct mild unsteadiness  Ambulation/Gait Ambulation/Gait assistance: Min guard Gait Distance (Feet): 150 Feet Assistive device: Straight cane;1 person hand held assist Gait Pattern/deviations: Step-through pattern;Ataxic Gait velocity: decreased   General Gait Details: ambulated with SPC and husband on opposite side  with instances of only SPC. Patient continues to demo mild ataxia and unsteadiness with mobility   Stairs Stairs: Yes Stairs assistance: Supervision Stair Management: No rails;Step to pattern;Forwards (HHAx2) Number of Stairs: 6 General stair comments: Initially with B handrails. Simulated home setup with HHAx2.   Wheelchair Mobility    Modified Rankin (Stroke Patients Only) Modified Rankin (Stroke Patients Only) Pre-Morbid Rankin Score: No symptoms Modified Rankin: Moderately severe disability     Balance Overall balance assessment: Needs assistance Sitting-balance support: No upper extremity supported;Feet supported Sitting balance-Leahy Scale: Good     Standing balance support: Single extremity supported;During functional activity Standing balance-Leahy Scale: Fair                              Cognition Arousal/Alertness: Awake/alert Behavior During Therapy: WFL for tasks assessed/performed Overall Cognitive Status: Within Functional Limits for tasks assessed                                 General Comments: some diffficulty with attending to dual cog tasks, but functional.      Exercises    General Comments       Pertinent Vitals/Pain Pain Assessment: No/denies pain    Home Living Family/patient expects to be discharged to:: Private residence Living Arrangements: Spouse/significant other Available Help at Discharge: Family;Available 24 hours/day Type of Home: House Home Access: Stairs to enter Entrance Stairs-Rails: None Home Layout: Two level;Able to live on main level with bedroom/bathroom Home Equipment: Toilet riser;Walker - 2 wheels;Bedside commode;Shower seat;Grab bars - tub/shower  Prior Function Level of Independence: Independent      Comments: drives   PT Goals (current goals can now be found in the care plan section) Acute Rehab PT Goals Patient Stated Goal: to go home PT Goal Formulation: With patient Time  For Goal Achievement: 08/07/20 Potential to Achieve Goals: Good Progress towards PT goals: Progressing toward goals    Frequency    Min 4X/week      PT Plan Current plan remains appropriate    Co-evaluation              AM-PAC PT "6 Clicks" Mobility   Outcome Measure  Help needed turning from your back to your side while in a flat bed without using bedrails?: None Help needed moving from lying on your back to sitting on the side of a flat bed without using bedrails?: None Help needed moving to and from a bed to a chair (including a wheelchair)?: A Little Help needed standing up from a chair using your arms (e.g., wheelchair or bedside chair)?: A Little Help needed to walk in hospital room?: A Little Help needed climbing 3-5 steps with a railing? : A Little 6 Click Score: 20    End of Session Equipment Utilized During Treatment: Gait belt Activity Tolerance: Patient tolerated treatment well Patient left: in bed;with call bell/phone within reach;with family/visitor present Nurse Communication: Mobility status PT Visit Diagnosis: Unsteadiness on feet (R26.81);Ataxic gait (R26.0)     Time: 4008-6761 PT Time Calculation (min) (ACUTE ONLY): 21 min  Charges:  $Therapeutic Activity: 8-22 mins                     Alayna A. Gilford Rile PT, DPT Acute Rehabilitation Services Pager [phone] Office [phone]    [name] 07/25/2020, 12:12 PM

## 2020-07-25 NOTE — Progress Notes (Addendum)
STROKE TEAM PROGRESS NOTE   INTERVAL HISTORY No acute events overnight.  Her husband and daughter are at the bedside.  She reports that she is feeling better today. She reports that her strength is returning.Discussed that the MRI did reveal a stroke and that given her known clot she would need to be screened for PFO. She was agreeable to this being done this afternoon. She had no other concerns at present.   Hospitalist Service is Primary. Vas [place] Transcranial Doppler W Bubbles ordered.  Vitals:   07/24/20 2146 07/25/20 0005 07/25/20 0408 07/25/20 0725  BP: (!) 94/50 (!) 100/47 (!) 105/53 (!) 107/50  Pulse: (!) 57 (!) 55 (!) 58 62  Resp:  18  20  Temp: 98.2 F (36.8 C) 98 F (36.7 C) 98.1 F (36.7 C) (!) 100.9 F (38.3 C)  TempSrc: Oral Oral Oral Oral  SpO2: 100% 96% 98% 96%  Weight:      Height:       CBC:  Recent Labs  Lab 07/23/20 1920  WBC 10.7*  NEUTROABS 9.0*  HGB 9.3*  HCT 29.3*  MCV 100.0  PLT 578*   Basic Metabolic Panel:  Recent Labs  Lab 07/23/20 1920 07/24/20 0812 07/25/20 0415  NA 133* 135 136  K 3.0* 3.0* 3.1*  CL 112* 115* 116*  CO2 14* 14* 14*  GLUCOSE 81 83 85  BUN 10 10 7*  CREATININE 1.26* 1.32* 1.34*  CALCIUM 7.9* 8.0* 8.1*  MG 1.7  --  2.0  PHOS  --   --  3.5   Lipid Panel:  Recent Labs  Lab 07/24/20 0501  CHOL 72  TRIG 175*  HDL 17*  CHOLHDL 4.2  VLDL 35  LDLCALC 20   HgbA1c:  Recent Labs  Lab 07/24/20 0501  HGBA1C 5.4   Urine Drug Screen: No results for input(s): LABOPIA, COCAINSCRNUR, LABBENZ, AMPHETMU, THCU, LABBARB in the last 168 hours.  Alcohol Level No results for input(s): ETH in the last 168 hours.  IMAGING past 24 hours MR BRAIN WO CONTRAST  Result Date: 07/24/2020 CLINICAL DATA:  Neuro deficit, acute, stroke suspected. EXAM: MRI HEAD WITHOUT CONTRAST TECHNIQUE: Multiplanar, multiecho pulse sequences of the brain and surrounding structures were obtained without intravenous contrast. COMPARISON:  CT angiogram  head/neck 07/23/2020. Noncontrast head CT 07/23/2020. FINDINGS: Brain: Mild intermittent motion degradation, limiting evaluation. Mild cerebral atrophy. There are numerous small acute cortical/subcortical infarcts within the right cerebral hemisphere, predominantly within the right MCA territory, as well as right MCA/ACA and right MCA/PCA watershed territories. Infarcts are present within the right frontal lobe, parietal lobe, temporal lobe and temporal occipital junction. Additionally, there are several scattered tiny acute infarcts within the left frontal white matter within the left MCA/ACA watershed territory. Background moderate multifocal T2/FLAIR hyperintensity within the cerebral white matter which is nonspecific, but compatible with chronic small vessel ischemic disease. This includes several chronic lacunar infarcts within the bilateral frontal lobe white matter. Nonspecific chronic microhemorrhage within the posterior left temporal lobe (series 7, image 40) No evidence of intracranial mass. No extra-axial fluid collection. No midline shift. Partially empty sella turcica. Vascular: Expected proximal arterial flow voids. Skull and upper cervical spine: No focal marrow lesion. Incompletely assessed upper cervical spondylosis. Sinuses/Orbits: Visualized orbits show no acute finding. Mild mucosal thickening within the bilateral ethmoid and maxillary sinuses. IMPRESSION: Numerous small scattered acute cortical/subcortical infarcts within the right cerebral hemisphere, predominantly within the right MCA territory, as well as right MCA/ACA and right MCA/PCA watershed territories. Infarcts are  present within the right frontal lobe, parietal lobe, temporal lobe and temporal occipital junction. Additionally, there are several scattered tiny acute infarcts within the left frontal white matter within the left MCA/ACA watershed territory. The pattern of the above described infarcts is suggestive of an embolic process  and clinical correlation is recommended. Background mild cerebral atrophy and mild-to-moderate chronic small vessel ischemic disease. Electronically Signed   By: Kellie Simmering DO   On: 07/24/2020 18:54   ECHOCARDIOGRAM COMPLETE  Result Date: 07/24/2020    ECHOCARDIOGRAM REPORT   Patient Name:   [name] [name] Date of Exam: 07/24/2020 Medical Rec #:  [id number]    Height:       68.0 in Accession #:    [id number]   Weight:       145.2 lb Date of Birth:  05-Aug-1950   BSA:          1.784 m Patient Age:    70 years     BP:           92/49 mmHg Patient Gender: F            HR:           69 bpm. Exam Location:  Inpatient Procedure: 2D Echo, Cardiac Doppler, Color Doppler and Intracardiac            Opacification Agent Indications:    Stroke  History:        Patient has prior history of Echocardiogram examinations, most                 recent 03/14/2020.  Sonographer:    Merrie Roof RDCS Referring Phys: [id number] [facility]  Sonographer Comments: Technically difficult IMPRESSIONS  1. Left ventricular ejection fraction, by estimation, is 60 to 65%. The left ventricle has normal function. The left ventricle has no regional wall motion abnormalities. Left ventricular diastolic parameters are consistent with Grade II diastolic dysfunction (pseudonormalization). Elevated left atrial pressure.  2. Right ventricular systolic function is normal. The right ventricular size is normal.  3. The mitral valve is normal in structure. No evidence of mitral valve regurgitation. No evidence of mitral stenosis.  4. The aortic valve is normal in structure. There is moderate calcification of the aortic valve. There is moderate thickening of the aortic valve. Aortic valve regurgitation is not visualized. No aortic stenosis is present.  5. The inferior vena cava is normal in size with greater than 50% respiratory variability, suggesting right atrial pressure of 3 mmHg. FINDINGS  Left Ventricle: Left ventricular ejection fraction, by  estimation, is 60 to 65%. The left ventricle has normal function. The left ventricle has no regional wall motion abnormalities. The left ventricular internal cavity size was normal in size. There is  no left ventricular hypertrophy. Left ventricular diastolic parameters are consistent with Grade II diastolic dysfunction (pseudonormalization). Elevated left atrial pressure. Right Ventricle: The right ventricular size is normal. No increase in right ventricular wall thickness. Right ventricular systolic function is normal. Left Atrium: Left atrial size was normal in size. Right Atrium: Right atrial size was normal in size. Pericardium: There is no evidence of pericardial effusion. Mitral Valve: The mitral valve is normal in structure. No evidence of mitral valve regurgitation. No evidence of mitral valve stenosis. Tricuspid Valve: The tricuspid valve is normal in structure. Tricuspid valve regurgitation is not demonstrated. No evidence of tricuspid stenosis. Aortic Valve: The aortic valve is normal in structure. There is moderate calcification of the aortic valve. There is moderate  thickening of the aortic valve. Aortic valve regurgitation is not visualized. No aortic stenosis is present. Aortic valve mean gradient measures 15.0 mmHg. Aortic valve peak gradient measures 24.4 mmHg. Aortic valve area, by VTI measures 1.18 cm. Pulmonic Valve: The pulmonic valve was normal in structure. Pulmonic valve regurgitation is not visualized. No evidence of pulmonic stenosis. Aorta: The aortic root is normal in size and structure. Venous: The inferior vena cava is normal in size with greater than 50% respiratory variability, suggesting right atrial pressure of 3 mmHg. IAS/Shunts: No atrial level shunt detected by color flow Doppler.  LEFT VENTRICLE PLAX 2D LVIDd:         4.70 cm      Diastology LVIDs:         3.30 cm      LV e' medial:    3.81 cm/s LV PW:         1.20 cm      LV E/e' medial:  21.3 LV IVS:        1.30 cm      LV  e' lateral:   5.00 cm/s LVOT diam:     2.25 cm      LV E/e' lateral: 16.2 LV SV:         63 LV SV Index:   35 LVOT Area:     3.98 cm  LV Volumes (MOD) LV vol d, MOD A2C: 250.0 ml LV vol d, MOD A4C: 185.0 ml LV vol s, MOD A2C: 137.0 ml LV vol s, MOD A4C: 105.0 ml LV SV MOD A2C:     113.0 ml LV SV MOD A4C:     185.0 ml LV SV MOD BP:      102.4 ml LEFT ATRIUM             Index       RIGHT ATRIUM           Index LA diam:        2.80 cm 1.57 cm/m  RA Area:     16.00 cm LA Vol (A2C):   88.4 ml 49.56 ml/m RA Volume:   32.50 ml  18.22 ml/m LA Vol (A4C):   94.1 ml 52.76 ml/m LA Biplane Vol: 95.4 ml 53.48 ml/m  AORTIC VALVE AV Area (Vmax):    1.32 cm AV Area (Vmean):   1.20 cm AV Area (VTI):     1.18 cm AV Vmax:           247.00 cm/s AV Vmean:          186.000 cm/s AV VTI:            0.535 m AV Peak Grad:      24.4 mmHg AV Mean Grad:      15.0 mmHg LVOT Vmax:         81.84 cm/s LVOT Vmean:        56.223 cm/s LVOT VTI:          0.159 m LVOT/AV VTI ratio: 0.30  AORTA Ao Root diam: 3.00 cm MITRAL VALVE MV Area (PHT): 2.24 cm    SHUNTS MV Decel Time: 338 msec    Systemic VTI:  0.16 m MV E velocity: 81.00 cm/s  Systemic Diam: 2.25 cm MV A velocity: 94.30 cm/s MV E/A ratio:  0.86 Mihai Croitoru MD Electronically signed by Sanda Klein MD Signature Date/Time: 07/24/2020/1:57:35 PM    Final     PHYSICAL EXAM Blood pressure (!) 107/50, pulse 62, temperature (!) 100.9 F (  38.3 C), temperature source Oral, resp. rate 20, height 5\' 8"  (1.727 m), weight 65.9 kg, last menstrual period 02/26/2014, SpO2 96 %.  General: alert and awake, elderly caucasian female, no apparent distress  Lungs: Symmetrical Chest rise, no labored breathing  Cardio: Regular Rate and Rhythm  Abdomen: Soft, non-tender  Neuro: Alert, oriented, thought content appropriate.  Speech fluent without evidence of aphasia.  Able to follow all commands without difficulty. Cranial Nerves: II:  Visual fields grossly normal, pupils equal, round,  reactive to light and accommodation III,IV, VI: ptosis not present, extra-ocular motions intact bilaterally V,VII: smile symmetric, facial light touch sensation normal bilaterally VIII: hearing normal bilaterally IX,X: uvula rises symmetrically XI: bilateral shoulder shrug XII: midline tongue extension without atrophy or fasciculations  Motor: Fine motor is reduced in the Left hand and there is orbiting present on the Left. Grip in the Left hand is still weak but improved from yesterday Right :  Upper extremity   5/5                                      Left:     Upper extremity   4/5             Lower extremity   5/5                                                  Lower extremity   4-/5 Tone and bulk:normal tone throughout; no atrophy noted Sensory: light touch intact throughout, bilaterally Cerebellar: normal finger-to-nose,  normal heel-to-shin test Gait: deferred    ASSESSMENT/PLAN [name] [name] is a 70 y.o. female with history ofhypercoagulable state,history of ischemic gut status post resection, short gut syndrome, anemia of chronic disease, chronic diastolic CHF, carotid stenosis, atherosclerosis who presents with complaint of slurred speech, left arm and leg numbness and weakness.She reports 2 days ago she began to have numbness and a heavy feeling in her L arm and leg.Initially she states her arm and leg did not feel right but was not weak and she was still able to ambulate and do activities. Today, about 5pm while riding in the car with her husbandshebegan to have garbled speech and unable to move L arm or leg at all. She was unable to stand and pivot to get out of the car when they got home so she was brought to ER. While in the ER her symptoms have improved but she is not back to baseline strength in her left arm or leg. Her speech has improved and is at baseline  Given her multiple infarct sites further testing for source of embolism needed. Due to known clot will need  to test for PFO. Have ordered Vas [place] Transcranial Doppler W Bubbles. Possibly will need consult with Vascular Surgery given 70% Stenosis in the Right ICA.  Numerous small scattered acute cortical/subcortical infarcts within the right cerebral hemisphere, predominantly within the right MCA territory and  watershed territories  also has several scattered tiny acute infarcts within the left frontal white matter within the left MCA/ACA watershed territory-infarct etiology likely cardioembolic given history of subclavian vein thrombosis strong suspicion for PFO or right-to-left shunt intracardiac  CT head - No acute intracranial hemorrhage or acute demarcated cortical infarction. Patchy and ill-defined  hypoattenuation within the bilateral cerebral white matter, progressed in the right centrum semiovale and corona radiata as compared to the prior head CT of 02/28/2010. Findings may reflect progressive chronic small vessel ischemic disease. However, given the provided history consider a brain MRI to exclude an acute white matter infarct at this site.  CTA head & neck - Negative CTA for large vessel occlusion. 70% atheromatous stenosis at the right carotid bulb/origin of the right ICA. 50% atheromatous stenosis at the left carotid bulb/origin of the left ICA. 5 mm left MCA bifurcation aneurysm.  MRI - Numerous small scattered acute cortical/subcortical infarcts within the right cerebral hemisphere, predominantly within the right MCA territory, as well as right MCA/ACA and right MCA/PCA watershed territories. Infarcts are present within the right frontal lobe, parietal lobe, temporal lobe and temporal occipital junction. Additionally, there are several scattered tiny acute infarcts within the left frontal white matter within the left MCA/ACA watershed territory. The pattern of the above described infarcts is suggestive of an embolic process and clinical correlation is recommended. Background mild cerebral atrophy  and mild-to-moderate chronic small vessel ischemic disease.  2D Echo - EF: 60-65% No wall motion abnormalities.  LDL 20  HgbA1c 5.4  VTE prophylaxis - SCD's        Diet   Diet Heart Room service appropriate? Yes; Fluid consistency: Thin         heparin IV and Xarelto (rivaroxaban) daily prior to admission, now on Xarelto (rivaroxaban) daily.   Therapy recommendations:  Home Health PT  Disposition:  Pending medical clearance  Hypertension  Home meds:  Entresto, Spironolactone  Stable  Long-term BP goal normotensive  Hyperlipidemia  Home meds:  none  LDL 20, goal < 70  Add Lipitor 80 mg daily   High intensity statin Lipitor 80 mg daily  Continue statin at discharge   Other Stroke Risk Factors  Advanced Age >/= 86   Former Cigarette smoker  Congestive heart failure  Other Active Problems  Short Bowel Syndrome   Hospital day # 0  Fatima Sanger MD Resident I have personally obtained history,examined this patient, reviewed notes, independently viewed imaging studies, participated in medical decision making and plan of care.ROS completed by me personally and pertinent positives fully documented  I have made any additions or clarifications directly to the above note. Agree with note above.  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 After hours, contact General Neurology

## 2020-07-26 ENCOUNTER — Telehealth: Payer: Self-pay | Admitting: Internal Medicine

## 2020-07-26 ENCOUNTER — Inpatient Hospital Stay (HOSPITAL_COMMUNITY): Payer: Medicare Other

## 2020-07-26 DIAGNOSIS — I639 Cerebral infarction, unspecified: Secondary | ICD-10-CM

## 2020-07-26 LAB — DIFFERENTIAL
Abs Immature Granulocytes: 0.03 10*3/uL (ref 0.00–0.07)
Basophils Absolute: 0.1 10*3/uL (ref 0.0–0.1)
Basophils Relative: 1 %
Eosinophils Absolute: 0.6 10*3/uL — ABNORMAL HIGH (ref 0.0–0.5)
Eosinophils Relative: 8 %
Immature Granulocytes: 0 %
Lymphocytes Relative: 25 %
Lymphs Abs: 2 10*3/uL (ref 0.7–4.0)
Monocytes Absolute: 1.3 10*3/uL — ABNORMAL HIGH (ref 0.1–1.0)
Monocytes Relative: 17 %
Neutro Abs: 3.9 10*3/uL (ref 1.7–7.7)
Neutrophils Relative %: 49 %

## 2020-07-26 LAB — GLUCOSE, CAPILLARY
Glucose-Capillary: 162 mg/dL — ABNORMAL HIGH (ref 70–99)
Glucose-Capillary: 122 mg/dL — ABNORMAL HIGH (ref 70–99)

## 2020-07-26 LAB — COMPREHENSIVE METABOLIC PANEL
ALT: 17 U/L (ref 0–44)
AST: 18 U/L (ref 15–41)
Albumin: 2.6 g/dL — ABNORMAL LOW (ref 3.5–5.0)
Alkaline Phosphatase: 83 U/L (ref 38–126)
Anion gap: 4 — ABNORMAL LOW (ref 5–15)
BUN: 14 mg/dL (ref 8–23)
CO2: 17 mmol/L — ABNORMAL LOW (ref 22–32)
Calcium: 8.3 mg/dL — ABNORMAL LOW (ref 8.9–10.3)
Chloride: 114 mmol/L — ABNORMAL HIGH (ref 98–111)
Creatinine, Ser: 1.14 mg/dL — ABNORMAL HIGH (ref 0.44–1.00)
GFR, Estimated: 52 mL/min — ABNORMAL LOW (ref >60)
Glucose, Bld: 116 mg/dL — ABNORMAL HIGH (ref 70–99)
Potassium: 4 mmol/L (ref 3.5–5.1)
Sodium: 135 mmol/L (ref 135–145)
Total Bilirubin: 0.8 mg/dL (ref 0.3–1.2)
Total Protein: 6.9 g/dL (ref 6.5–8.1)

## 2020-07-26 LAB — CBC
HCT: 28.3 % — ABNORMAL LOW (ref 36.0–46.0)
Hemoglobin: 8.8 g/dL — ABNORMAL LOW (ref 12.0–15.0)
MCH: 30.9 pg (ref 26.0–34.0)
MCHC: 31.1 g/dL (ref 30.0–36.0)
MCV: 99.3 fL (ref 80.0–100.0)
Platelets: 102 10*3/uL — ABNORMAL LOW (ref 150–400)
RBC: 2.85 MIL/uL — ABNORMAL LOW (ref 3.87–5.11)
RDW: 14.6 % (ref 11.5–15.5)
WBC: 8 10*3/uL (ref 4.0–10.5)
nRBC: 0 % (ref 0.0–0.2)

## 2020-07-26 LAB — PHOSPHORUS: Phosphorus: 2.4 mg/dL — ABNORMAL LOW (ref 2.5–4.6)

## 2020-07-26 LAB — TRIGLYCERIDES: Triglycerides: 101 mg/dL (ref <150)

## 2020-07-26 LAB — MAGNESIUM: Magnesium: 1.9 mg/dL (ref 1.7–2.4)

## 2020-07-26 LAB — PREALBUMIN: Prealbumin: 11.7 mg/dL — ABNORMAL LOW (ref 18–38)

## 2020-07-26 MED ORDER — CHLORHEXIDINE GLUCONATE CLOTH 2 % EX PADS: 6.0000 | MEDICATED_PAD | Freq: Every day | CUTANEOUS | Status: DC

## 2020-07-26 NOTE — Progress Notes (Signed)
PHARMACY - TOTAL PARENTERAL NUTRITION CONSULT NOTE   Indication: Small bowel obstruction  Patient Measurements:  Height: 5\' 8"  (172.7 cm) Weight: 66 kg (145 lb 9.6 oz) IBW/kg (Calculated) : 63.9 TPN AdjBW (KG): 65.9 Body mass index is 22.14 kg/m.  Assessment:  70 yo W on chronic TPN for short gut syndrome presenting with acute ischemic stroke. Patient reports she does TPN 4 days per week (Mon to [employer]). TPN was held for anticipated discharge. She follows with [facility] [phone]) for her home TPN. Pharmacy consulted to dose TPN inpatient 3/16 and was started on Clinimix given timing of consult. Patient prefers to stay on Mon through Thursday TPN instead of daily.   Glucose / Insulin: BG <180, A1C 5.4, 0 insulin used  Electrolytes: Cl 114, K 3.1> 4 (K 20 IV+80 PO on 3/16), CO2 17, CoCa 9.4, phos 2.4, Mg 1.7>1.9 (Mg 2g IV on 3/15)  Renal: Scr 1.14 (BL ~1), BUN 14  Hepatic: LFT/Tbili wnl, prealbumin 11.7, albumin 2.6  Intake / Output; MIVF: not well documented  GI Imaging: none GI Surgeries / Procedures: none  Central access: Tunneled CVC TPN start date: Chronic   Nutritional Goals (per RD recommendation on pending): Goal TPN rate is 200 mL/hr x 10d (taper up and down 1hr; provides 79 g of protein and 1944 kcals per day)  Current Nutrition:  Clinimix 5/20 3/15- 3/16  TPN 3/17 >>  Home TPN:  Dextrose 300g, 15%AA 80g, lipid 65g, KCl 40 mEq, NaCl 200 mEq, Na Acetate 70 mEq, Ca 15 mEq, Kphos 14 mmol, Mg 15 mEq, TE 69ml, levocarnitine 500mg , 1880ml, 1989 kCal given 4 days a week (Mon to [employer]) over 10 hours  Plan:  Per MD, likely discharging today so will not order TPN   Stop Clinimix after current bag  If this changes, can start on 3/18:  TPN at 200 mL/hr x 10hr (taper up and down 1hr) at 1800 on Monday through Thursday only per home scheduled  Electrolytes in TPN to match home TPN: Na 15mEq/L, K 8mEq/L, Ca 48mEq/L, Mg 63mEq/L, and Phos 85mmol/L. Max Ac  Add standard  MVI and trace elements to TPN No insulin needed Monitor TPN labs on Mon/Thurs   Benetta Spar, PharmD, BCPS, BCCP Clinical Pharmacist  Please check AMION for all [place] phone numbers After 10:00 PM, call [place]

## 2020-07-26 NOTE — TOC Transition Note (Signed)
Transition of Care [facility]) - CM/SW Discharge Note   Patient Details  Name: Icesis C Deboard MRN: [id number] Date of Birth: Jan 04, 1951  Transition of Care [facility]) CM/SW Contact:  Pollie Friar, RN Phone Number: 07/26/2020, 3:38 PM   Clinical Narrative:    Patient is discharging home with [facility] services arranged through [facility]. [place] with [facility] is aware of d/c home.  Cane for home is at the bedside.  Pt has transportation home.   Final next level of care: Home w Home Health Services Barriers to Discharge: No Barriers Identified   Patient Goals and CMS Choice   CMS Medicare.gov Compare Post Acute Care list provided to:: Patient Choice offered to / list presented to : Patient  Discharge Placement                       Discharge Plan and Services   Discharge Planning Services: CM Consult Post Acute Care Choice: Home Health,Durable Medical Equipment          DME Arranged: Kasandra Knudsen DME Agency: [employer] Date DME Agency Contacted: 07/24/20   Representative spoke with at DME Agency: [place]: PT,OT [place] Agency: [place] ([place]) Date [place]: 07/24/20   Representative spoke with at [place]: [place] ([place]) Interventions     Readmission Risk Interventions No flowsheet data found.

## 2020-07-26 NOTE — Plan of Care (Signed)
  Problem: Acute Rehab PT Goals(only PT should resolve) Goal: Patient Will Transfer Sit To/From Stand Outcome: Adequate for Discharge Goal: Pt Will Ambulate Outcome: Adequate for Discharge Goal: Pt Will Go Up/Down Stairs Outcome: Adequate for Discharge   

## 2020-07-26 NOTE — Progress Notes (Signed)
Occupational Therapy Treatment Patient Details Name: [name] [name] MRN: [id number] DOB: [birth year] Today's Date: 07/26/2020    History of present illness [name] [name] is a 70 yo female who presents with L UE and LE weakness and slurred speech. CT neg, awaiting MRI.  PMH:  hypercoagulable state, history of ischemic gut status post resection, short gut syndrome, anemia of chronic disease, chronic diastolic CHF, carotid stenosis, atherosclerosis.   OT comments  Issued yellow theraputty and reviewed exercises. Pt verbalized and demonstrated understanding. [place] home today with good support.    Follow Up Recommendations  Outpatient OT;Supervision - Intermittent    Equipment Recommendations  None recommended by OT    Recommendations for Other Services      Precautions / Restrictions Precautions Precautions: Fall       Mobility Bed Mobility               General bed mobility comments: sitting EOB on arrival    Transfers Overall transfer level: Needs assistance Equipment used: Straight cane Transfers: Sit to/from Stand Sit to Stand: Supervision              Balance Overall balance assessment: Needs assistance Sitting-balance support: No upper extremity supported;Feet supported Sitting balance-Leahy Scale: Good     Standing balance support: No upper extremity supported;During functional activity Standing balance-Leahy Scale: Fair                             ADL either performed or assessed with clinical judgement   ADL Overall ADL's : Needs assistance/impaired                         Toilet Transfer: Supervision/safety;Ambulation Toilet Transfer Details (indicate cue type and reason): simulated to chair         Functional mobility during ADLs: Supervision/safety;Cane       Vision       Perception     Praxis      Cognition Arousal/Alertness: Awake/alert Behavior During Therapy: WFL for tasks assessed/performed Overall  Cognitive Status: Within Functional Limits for tasks assessed                                          Exercises     Shoulder Instructions       General Comments issued yellow theraputty and pt demonstrated ability to complete exercises: grasp, flattening, rolling, pinch and lateral pinch    Pertinent Vitals/ Pain       Pain Assessment: No/denies pain  Home Living     Available Help at Discharge: Family;Available 24 hours/day Type of Home: House                              Lives With: Spouse    Prior Functioning/Environment              Frequency  Min 2X/week        Progress Toward Goals  OT Goals(current goals can now be found in the care plan section)  Progress towards OT goals: Progressing toward goals  Acute Rehab OT Goals Patient Stated Goal: to go home OT Goal Formulation: With patient  Plan Discharge plan remains appropriate;Frequency remains appropriate    Co-evaluation  AM-PAC OT "6 Clicks" Daily Activity     Outcome Measure   Help from another person eating meals?: None Help from another person taking care of personal grooming?: A Little Help from another person toileting, which includes using toliet, bedpan, or urinal?: A Little Help from another person bathing (including washing, rinsing, drying)?: A Little Help from another person to put on and taking off regular upper body clothing?: A Little Help from another person to put on and taking off regular lower body clothing?: A Little 6 Click Score: 19    End of Session Equipment Utilized During Treatment: Other (comment) (cane)  OT Visit Diagnosis: Unsteadiness on feet (R26.81);Other symptoms and signs involving the nervous system (R29.898);Hemiplegia and hemiparesis Hemiplegia - Right/Left: Left Hemiplegia - dominant/non-dominant: Non-Dominant Hemiplegia - caused by: Cerebral infarction   Activity Tolerance Patient tolerated treatment  well   Patient Left in chair;Other (comment) (transport chair with NT)   Nurse Communication          Time: 8786-7672 OT Time Calculation (min): 9 min  Charges: OT General Charges $OT Visit: 1 Visit OT Treatments $Neuromuscular Re-education: 8-22 mins  Jolaine Artist, [place] Pager [phone] Office [phone]    Delight Stare 07/26/2020, 3:30 PM

## 2020-07-26 NOTE — Progress Notes (Signed)
TCD bubble study has been completed.   Preliminary results in CV Proc.   Abram Sander 07/26/2020 11:34 AM

## 2020-07-26 NOTE — Progress Notes (Signed)
Consulted to change patient [name] [name] subclavian PICC dressing. Patient claimed she change her own dressing and does not want to change it at this time. Dressing is due this coming  Sunday since she changed her dressing last Sunday 3/13. Patient is agreeable to change her dressing in due time if she is still in the hospital. RN made aware.

## 2020-07-26 NOTE — Progress Notes (Signed)
Discharge instructions (including medications) discussed with and copy provided to patient/caregiver 

## 2020-07-26 NOTE — Progress Notes (Addendum)
STROKE TEAM PROGRESS NOTE   INTERVAL HISTORY Her husband is at the bedside.  She reports feeling stronger today. She reports being able to walk around the room and hallway with PT. Transcranial [place] bubble study was done. Results showed a trivial right-to-left shunt during valsalva maneuver which was likely clinically insignificant. She had no other concerns.   Hospitalist Service is Primary. She is cleared for Discharge.  Vitals:   07/25/20 2016 07/26/20 0010 07/26/20 0318 07/26/20 0700  BP: (!) 91/55 (!) 105/51 (!) 97/54 98/79  Pulse: 64 (!) 59 (!) 56 (!) 58  Resp: 20   14  Temp: 98.7 F (37.1 C) 97.7 F (36.5 C) 97.8 F (36.6 C) (!) 97.5 F (36.4 C)  TempSrc: Oral Oral Oral Oral  SpO2: 97% 95% 96% 98%  Weight:      Height:       CBC:  Recent Labs  Lab 07/23/20 1920 07/26/20 0453  WBC 10.7* 8.0  NEUTROABS 9.0* 3.9  HGB 9.3* 8.8*  HCT 29.3* 28.3*  MCV 100.0 99.3  PLT 115* 623*   Basic Metabolic Panel:  Recent Labs  Lab 07/25/20 0415 07/26/20 0453  NA 136 135  K 3.1* 4.0  CL 116* 114*  CO2 14* 17*  GLUCOSE 85 116*  BUN 7* 14  CREATININE 1.34* 1.14*  CALCIUM 8.1* 8.3*  MG 2.0 1.9  PHOS 3.5 2.4*   Lipid Panel:  Recent Labs  Lab 07/24/20 0501 07/26/20 0453  CHOL 72  --   TRIG 175* 101  HDL 17*  --   CHOLHDL 4.2  --   VLDL 35  --   LDLCALC 20  --    HgbA1c:  Recent Labs  Lab 07/24/20 0501  HGBA1C 5.4   Urine Drug Screen: No results for input(s): LABOPIA, COCAINSCRNUR, LABBENZ, AMPHETMU, THCU, LABBARB in the last 168 hours.  Alcohol Level No results for input(s): ETH in the last 168 hours.  IMAGING past 24 hours No results found.  PHYSICAL EXAM Blood pressure 98/79, pulse (!) 58, temperature (!) 97.5 F (36.4 C), temperature source Oral, resp. rate 14, height 5\' 8"  (1.727 m), weight 66 kg, last menstrual period 02/26/2014, SpO2 98 %.  General: alert and awake, elderly Caucasian female, no apparent distress  Lungs: Symmetrical Chest rise, no  labored breathing  Cardio: Regular Rate and Rhythm  Abdomen: Soft, non-tender  Neuro: Alert, oriented, thought content appropriate. Speech fluent without evidence of aphasia. Able to follow allcommands without difficulty. Cranial Nerves: II: Visual fields grossly normal, pupils equal, round, reactive to light and accommodation III,IV, VI: ptosis not present, extra-ocular motions intact bilaterally V,VII: smile symmetric, facial light touch sensation normal bilaterally VIII: hearing normal bilaterally IX,X: uvula rises symmetrically XI: bilateral shoulder shrug XII: midline tongue extension without atrophy or fasciculations  Motor:Fine motor continues to improve on the Left hand and there is still orbiting present on the Left. Grip in the Left hand improved from yesterday. Right :Upper extremity5/5Left: Upper extremity 4/5 Lower extremity 5/5Lower extremity 4-/5 Tone and bulk:normal tone throughout; no atrophy noted Sensory: light touch intact throughout, bilaterally Cerebellar: normal finger-to-nose, normal heel-to-shin test Gait:deferred    ASSESSMENT/PLAN Ms. Ardean C Hammersmith is a 70 y.o. female with history ofhypercoagulable state,history of ischemic gut status post resection, short gut syndrome, anemia of chronic disease, chronic diastolic CHF, carotid stenosis, atherosclerosis who presents with complaint of slurred speech, left arm and leg numbness and weakness.She reports 2 days ago she began to have numbness and a heavy feeling in  her L arm and leg.Initially she states her arm and leg did not feel right but was not weak and she was still able to ambulate and do activities. Today, about 5pm while riding in the car with her husbandshebegan to have garbled speech and unable to move L arm or leg at all. She was unable to stand and pivot to get out of the car  when they got home so she was brought to ER. While in the ER her symptoms have improved but she is not back to baseline strength in her left arm or leg. Her speech has improved and is at baseline  She continues to improve. Her Transcranial bubble study result was insignificant on Valsalva maneuver. She is cleared for discharge.   Numerous small scattered acute cortical/subcortical infarcts within the right cerebral hemisphere, predominantly within the right MCA territory and  watershed territories  also has several scattered tiny acute infarcts within the left frontal white matter within the left MCA/ACA watershed territory-infarct etiology likely cardioembolic given history of subclavian vein thrombosis strong suspicion for PFO or right-to-left shunt intracardiac  CT head-No acute intracranial hemorrhage or acute demarcated cortical infarction. Patchy and ill-defined hypoattenuation within the bilateral cerebral white matter, progressed in the right centrum semiovale and corona radiata as compared to the prior head CT of 02/28/2010. Findings may reflect progressive chronic small vessel ischemic disease. However, given the provided history consider a brain MRI to exclude an acute white matter infarct at this site.  CTA head & neck-Negative CTA for large vessel occlusion. 70% atheromatous stenosis at the right carotid bulb/origin of the right ICA. 50% atheromatous stenosis at the left carotid bulb/origin of the left ICA.88mm left MCA bifurcation aneurysm.  MRI- Numerous small scattered acute cortical/subcortical infarcts within the right cerebral hemisphere, predominantly within the right MCA territory, as well as right MCA/ACA and right MCA/PCA watershed territories. Infarcts are present within the right frontal lobe, parietal lobe, temporal lobe and temporal occipital junction. Additionally, there are several scattered tiny acute infarcts within the left frontal white matter within the left  MCA/ACA watershed territory. The pattern of the above described infarcts is suggestive of an embolic process and clinical correlation is recommended. Background mild cerebral atrophy and mild-to-moderate chronic small vessel ischemic disease.  2D Echo- EF: 60-65% No wall motion abnormalities.  LDL20  HgbA1c5.4  VTE prophylaxis -SCD's       Diet   Diet Heart Room service appropriate? Yes; Fluid consistency: Thin         heparin IV and Xarelto (rivaroxaban) dailyprior to admission, now on Xarelto (rivaroxaban) daily.   Therapy recommendations:Home Health PT  Disposition:Pending medical clearance  Hypertension  Home meds:Entresto, Spironolactone  Stable  Long-term BP goal normotensive  Hyperlipidemia  Home meds:none  LDL 20, goal < 70  AddLipitor 80 mg daily  High intensity statinLipitor 80 mg daily  Continue statin at discharge   Other Stroke Risk Factors  Advanced Age >/= 55  FormerCigarette smoker  Congestive heart failure  Other Active Problems  Short Bowel Syndrome   Hospital day # 1  Fatima Sanger MD Resident Patient is neurologically stable and ready to go home.  Transcranial Doppler bubble study shows a very tiny clinically insignificant right-to-left shunt.  Continue current treatment.  Stroke team will sign off.  Discussed with Dr. Lambert Keto, MD  To contact Stroke Continuity provider, please refer to [email or web address] After hours, contact General Neurology

## 2020-07-26 NOTE — Progress Notes (Signed)
Patient is ready for discharge home via family transport. SWAT RN provided education and discharge instructions. Patient is leaving with central line, refused dressing change from IV team this morning.

## 2020-07-26 NOTE — Telephone Encounter (Signed)
Did you get a phone number?  What infusion?

## 2020-07-26 NOTE — Evaluation (Signed)
Speech Language Pathology Evaluation Patient Details Name: [name] [name] MRN: [id number] DOB: 1951-04-24 Today's Date: 07/26/2020 Time: 4580-9983 SLP Time Calculation (min) (ACUTE ONLY): 13 min  Problem List:  Patient Active Problem List   Diagnosis Date Noted  . Stroke (cerebrum) ([place]) 07/25/2020  . Cerebrovascular accident (CVA) ([place]) 07/24/2020  . Thrombocytopenia ([place]) 07/24/2020  . CKD (chronic kidney disease), stage III ([place]) 07/24/2020  . Pressure injury of skin 07/24/2020  . Stroke ([place]) 07/23/2020  . 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 Hypotension 12/30/2017  . Bradycardia 12/30/2017  . 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 Degenerative joint disease 07/08/2016  . PICC line infection 05/16/2015  . 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 Splenomegaly   . Hx of adenomatous colonic polyps 04/18/2012  . Chronic systolic heart failure ([place]) 12/25/2011  . Osteoporosis 03/14/2010  . Anemia of chronic disease 10/01/2009  . GERD (gastroesophageal reflux disease) 11/19/2008  . ALKALINE PHOSPHATASE, ELEVATED 10/12/2008  . VITAMIN D DEFICIENCY 01/19/2008  . TRANSAMINASES, SERUM, ELEVATED 01/19/2008  . Bacterial overgrowth syndrome 11/19/2004  . Acquired short bowel syndrome 11/20/2003   Past Medical History:  Past Medical History:  Diagnosis Date  . Abnormal LFTs 2013  . Allergic rhinosinusitis   . Anemia of chronic disease   . At risk for dental problems   . Atypical nevus   . Bacteremia due to Klebsiella pneumoniae 12/12/2011  . Bacterial overgrowth syndrome   . 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 Carotid stenosis    Carotid US (9/15):  R 40-59%; L 1-39% >> FU 1 year  . Closed left subtrochanteric femur fracture ([place]) 12/31/2017  . Congestive heart failure (CHF) (HCC)    EF 25-30%  . 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 Fracture of left clavicle   . History of blood transfusion 2013   anemia  . Hx of cardiovascular stress test    Myoview (9/15):  inf-apical scar; no ischemia; EF 47% - low risk   . 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 Osteoporosis   . Pancytopenia 10/07/2011  . Pathologic fracture of neck of femur ([place])   . Personal history of colonic polyps   . Renal insufficiency    hx of yrs ago  . Serratia marcescens infection - bactermia assoc w/ PICC 01/18/2015  . 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[place])   . Splenomegaly    By ultrasound  . Thrombophilia ([place])   . Vitamin D deficiency    Past Surgical History:  Past Surgical History:  Procedure Laterality Date  . APPENDECTOMY  yrs ago  . CHOLECYSTECTOMY  yrs ago  . COLONOSCOPY  12/05/2005   internal hemorrhoids (for polyp surveillance)  . COLONOSCOPY  04/26/2012   Procedure: COLONOSCOPY;  Surgeon: Gatha Mayer, MD;  Location: [facility];  Service: Endoscopy;  Laterality: N/A;  . COLONOSCOPY WITH PROPOFOL N/A 03/18/2016   Procedure: COLONOSCOPY WITH PROPOFOL;  Surgeon: Gatha Mayer, MD;  Location: [facility];  Service: Endoscopy;  Laterality: N/A;  . ESOPHAGOGASTRODUODENOSCOPY  01/22/2009   erosive esophagitis  . HARDWARE REMOVAL Left 12/31/2017   Procedure: HARDWARE REMOVAL;  Surgeon: Rod Can, MD;  Location: [facility];  Service: Orthopedics;  Laterality: Left;  . INTRAMEDULLARY (IM) NAIL INTERTROCHANTERIC Left 12/31/2017   Procedure: INTRAMEDULLARY (IM) NAIL SUBTROCHANTRIC;  Surgeon: Rod Can, MD;  Location: [facility];  Service: Orthopedics;  Laterality: Left;  . IR CV LINE INJECTION  03/23/2018  . IR FLUORO GUIDE CV LINE LEFT  01/01/2018  . IR FLUORO GUIDE CV LINE LEFT  03/24/2018  . IR FLUORO GUIDE CV LINE LEFT  05/21/2018  . IR FLUORO GUIDE CV LINE LEFT  07/09/2018  . IR GENERIC HISTORICAL  07/04/2016   IR REMOVAL TUN CV CATH W/O FL 07/04/2016 Ascencion Dike, PA-C [facility]  .  IR GENERIC HISTORICAL  07/10/2016   IR US GUIDE VASC ACCESS LEFT 07/10/2016 Arne Cleveland, MD [facility]  . IR GENERIC HISTORICAL  07/10/2016   IR FLUORO GUIDE CV LINE LEFT 07/10/2016 Arne Cleveland, MD [facility]  . IR PATIENT EVAL TECH 0-60 MINS  10/07/2017  . IR PATIENT EVAL TECH 0-60 MINS  03/09/2018  . IR RADIOLOGIST EVAL & MGMT  07/21/2017  . LEFT HEART CATH AND CORONARY ANGIOGRAPHY N/A 06/26/2020   Procedure: LEFT HEART CATH AND CORONARY ANGIOGRAPHY;  Surgeon: Jolaine Artist, MD;  Location: [facility];  Service: Cardiovascular;  Laterality: N/A;  . ORIF PROXIMAL FEMORAL FRACTURE W/ ITST NAIL SYSTEM  03/2007   left, Dr. Shellia Carwin  . SMALL INTESTINE SURGERY  2005   multiple with right colon resection for ischemia/infarct   HPI:  70 yo female who presents with L UE and LE weakness and slurred speech. MRI revealed Numerous small scattered acute cortical/subcortical infarcts within the right cerebral hemisphere, predominantly within the right MCA territory, as well as right MCA/ACA and right MCA/PCA watershed territories. Infarcts are present within the right frontal lobe, parietal lobe, temporal lobe and temporal occipital junction. Additionally, there are several scattered tiny acute infarcts within the left frontal white matter within the left MCA/ACA watershed territory.  PMH:  hypercoagulable state, history of ischemic gut status post resection, short gut syndrome, anemia of chronic disease, chronic diastolic CHF, carotid stenosis, atherosclerosis   Assessment / Plan / Recommendation Clinical Impression  Pts cognitive linguistic skills appear grossly within functional limits. Pt denies acute changes with thinking skills, spouse also reports pt is at baseline. PLOF pt very independent with all ADLs, with supportive family. Pts previously reported dysarthria during event of stroke, which has resolved completely. Motor speech skills are intact as well as receptive and expressive  language. Pt oriented x4, able to complete complex financial management task of paying bills while hospitalized. No further ST needs identified.    SLP Assessment  SLP Recommendation/Assessment: Patient does not need any further Speech Lanaguage Pathology Services SLP Visit Diagnosis: Dysarthria and anarthria (R47.1)    Follow Up Recommendations    n/a   Frequency and Duration           SLP Evaluation Cognition  Overall Cognitive Status: Within Functional Limits for tasks assessed Arousal/Alertness: Awake/alert Orientation Level: Oriented X4 Attention: Focused;Sustained Focused Attention: Appears intact Sustained Attention: Appears intact Memory: Appears intact Awareness: Appears intact Problem Solving: Appears intact Executive Function: Organizing;Self Monitoring Organizing: Appears intact Self Monitoring: Appears intact Safety/Judgment: Appears intact       Comprehension  Auditory Comprehension Overall Auditory Comprehension: Appears within functional limits for tasks assessed    Expression Expression Primary Mode of Expression: Nonverbal - gestures Verbal Expression Overall Verbal Expression: Appears within functional limits for tasks assessed Written Expression Dominant Hand: Right   Oral / Motor  Oral Motor/Sensory Function Overall Oral Motor/Sensory Function: Within functional limits Motor Speech Overall Motor Speech: Appears within functional limits for tasks assessed   GO  [place], CCC-SLP Acute Rehabilitation Services   07/26/2020, 1:21 PM

## 2020-07-26 NOTE — Telephone Encounter (Signed)
[facility] called to request an order for patient to resume infusion

## 2020-07-26 NOTE — Discharge Summary (Signed)
Physician Discharge Summary  [name] [name] CMK:[id number] DOB: 1950-09-21 DOA: 07/23/2020  PCP: [name], MD  Admit date: 07/23/2020 Discharge date: 07/26/2020  Admitted From: Home Disposition: Home with home health  Recommendations for Outpatient Follow-up:  1. Follow up with PCP in 1-2 weeks 2. Please obtain BMP/CBC/magnesium/phosphorus as per home health plan.  TPN plan.  3. We will send referral to neurology for follow-up.  HomeHealth: PT Equipment/Devices: Present at home  Discharge Condition: Stable CODE STATUS: Full code Diet recommendation: Low-salt diet.  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 She had decreased grip on the left arm. Admitted to the hospital with right hemispheric TIA/acute stroke.  Acute ischemic stroke: Clinical findings, garbled speech, left arm and leg weakness that is gradually improving. CT head findings, no acute findings. Progressive chronic small vessel ischemic disease. MRI of the brain, numerous small scattered acute cortical and subcortical infarct within the right cerebral hemisphere, right MCA territory as well as ACA and PCA territory. CTA of the head and neck, no large vessel occlusion. 70% atheromatous stenosis at the right carotid bulb. 50% stenosis at the left carotid bulb. 2D echocardiogram, normal ejection fraction. Grade 2 diastolic dysfunction. Antiplatelet therapy, on Xarelto at home continued. LDL 20. Too low.  Do not need statin. Hemoglobin A1c 5.4. Therapy recommendations, home health PT. Neurology recommended to continue with Xarelto.  Underwent transcranial Doppler with no significant  shunt and bubble study.  Short gut syndrome,hypoglycemia,long-term TPN and IV fluid management: Allow regular diet. Lomotil as needed.  Resume TPN at home.  Electrolytes are adequate today.  History of ischemic bowel/hypercoagulable state/ right IJ clot: Chronically anticoagulated on Xarelto. Continue   Patient is medically stable.  She has completed stroke work-up.  She will be discharged home.  Refer to outpatient neurology for follow-up.   Discharge Diagnoses:  Principal Problem:   Cerebrovascular accident (CVA) ([place]) Active Problems:   Anemia of chronic disease   Acquired short bowel syndrome   Chronic systolic heart failure (HCC)   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HCC)   Thrombocytopenia (HCC)   CKD (chronic kidney disease), stage III (HCC)   Pressure injury of skin   Stroke (cerebrum) [facility])    Discharge Instructions  Discharge Instructions    Ambulatory referral to Neurology   Complete by: As directed    An appointment is requested in approximately: 4 weeks   Diet - low sodium heart healthy   Complete by: As directed    Increase activity slowly   Complete by: As directed    No wound care   Complete by: As directed      Allergies as of 07/26/2020      Reactions   Penicillins Itching, Swelling, Other (See Comments)   Reaction:  Facial swelling Has patient had a PCN reaction causing immediate rash, facial/tongue/throat swelling, SOB or lightheadedness with hypotension: Yes Has patient had a PCN reaction causing severe rash involving mucus membranes or skin necrosis: No Has patient had a PCN reaction that required hospitalization No Has patient had a PCN reaction occurring within the last 10 years: No If all of the above answers are "NO", then may proceed with Cephalosporin use.  Medication List    TAKE these medications   acetaminophen 500 MG tablet Commonly known as: TYLENOL Take 1,000 mg by mouth daily as needed for mild pain.   ADULT GUMMY PO Take 2  tablets by mouth daily.   ADULT TPN Inject 1,800 mLs into the vein 4 (four) times a week. Pt receives home TPN from [facility]:  1800 mL bag, four nights weekly (Monday, Tuesday, Wednesday, Thursday for 8 hours (includes 1 hour taper up and down).   CALCIUM + D3 PO Take 2 tablets by mouth daily.   cetirizine 10 MG tablet Commonly known as: ZYRTEC Take 1 tablet (10 mg total) by mouth daily. What changed: when to take this   clobetasol ointment 0.05 % Commonly known as: TEMOVATE Apply 1 application topically 2 (two) times daily. What changed:   when to take this  reasons to take this   denosumab 60 MG/ML Sosy injection Commonly known as: PROLIA Inject 60 mg into the skin every 6 (six) months.   diphenhydrAMINE 25 mg capsule Commonly known as: BENADRYL Take 25-50 mg by mouth every 6 (six) hours as needed for itching or sleep.   Entresto 24-26 MG Generic drug: sacubitril-valsartan Take 1 tablet by mouth 2 (two) times daily.   fluticasone 50 MCG/ACT nasal spray Commonly known as: FLONASE SPRAY 2 SPRAYS INTO EACH NOSTRIL EVERY DAY What changed: See the new instructions.   furosemide 20 MG tablet Commonly known as: LASIX Take one tablet by mouth as needed for swelling What changed:   how much to take  how to take this  when to take this  reasons to take this  additional instructions   HEPARIN LOCK FLUSH IJ Inject 5 mLs as directed 4 (four) times a week. Monday, Tuesday, Wednesday, Thursday in the morning with TPN   omeprazole 40 MG capsule Commonly known as: PRILOSEC TAKE 1 CAPSULE 2 (TWO) TIMES DAILY BY MOUTH. TAKE BEFORE BREAKFAST AND SUPPER What changed: See the new instructions.   ondansetron 8 MG disintegrating tablet Commonly known as: ZOFRAN-ODT TAKE 1 TABLET (8 MG TOTAL) BY MOUTH EVERY 8 (EIGHT) HOURS AS NEEDED FOR NAUSEA OR VOMITING.   sodium chloride 0.9 % infusion Inject 900 mLs into the vein every 14 (fourteen) days. Uses for TPN    spironolactone 25 MG tablet Commonly known as: ALDACTONE Take 1 tablet (25 mg total) by mouth daily.   Teduglutide (rDNA) 5 MG Kit Commonly known as: Gattex Inject 3.3 Units into the skin daily. What changed: how much to take   vitamin E 1000 UNIT capsule Take 1,000 Units by mouth daily.   Xarelto 20 MG Tabs tablet Generic drug: rivaroxaban TAKE 1 TABLET (20 MG TOTAL) BY MOUTH DAILY WITH SUPPER. What changed: how much to take            Durable Medical Equipment  (From admission, onward)         Start     Ordered   07/24/20 1516  For home use only DME Cane  Once        07/24/20 1515          Allergies  Allergen Reactions  . Penicillins Itching, Swelling and Other (See Comments)    Reaction:  Facial swelling Has patient had a PCN reaction causing immediate rash, facial/tongue/throat swelling, SOB or lightheadedness with hypotension: Yes Has patient had a PCN reaction causing severe rash involving mucus membranes or skin necrosis: No Has patient had a PCN reaction that required hospitalization No Has  patient had a PCN reaction occurring within the last 10 years: No If all of the above answers are "NO", then may proceed with Cephalosporin use.    Consultations:  Neurology   Procedures/Studies: CT Angio Head W or Wo Contrast  Result Date: 07/23/2020 CLINICAL DATA:  Initial evaluation for acute stroke, left-sided weakness with slurred speech. EXAM: CT ANGIOGRAPHY HEAD AND NECK TECHNIQUE: Multidetector CT imaging of the head and neck was performed using the standard protocol during bolus administration of intravenous contrast. Multiplanar CT image reconstructions and MIPs were obtained to evaluate the vascular anatomy. Carotid stenosis measurements (when applicable) are obtained utilizing NASCET criteria, using the distal internal carotid diameter as the denominator. CONTRAST:  70m OMNIPAQUE IOHEXOL 350 MG/ML SOLN COMPARISON:  Prior head CT from earlier the same  day. FINDINGS: CTA NECK FINDINGS Aortic arch: Visualized aortic arch of normal caliber with normal branch pattern. Mild-to-moderate atheromatous change about the arch and origin of the great vessels without hemodynamically significant stenosis. Visualized subclavian arteries patent. Right carotid system: Right CCA patent from its origin to the bifurcation without stenosis. Eccentric calcified plaque at the right carotid bulb/proximal right ICA with associated stenosis of up to 70% by NASCET criteria. Right ICA patent distally without stenosis, dissection or occlusion. Left carotid system: Left CCA patent from its origin to the bifurcation without stenosis. Scattered calcified plaque about the left carotid bulb/proximal left ICA with associated stenosis of up to 50% by NASCET criteria. Left ICA patent distally without stenosis, dissection or occlusion. Vertebral arteries: Both vertebral arteries arise from the subclavian arteries. Vertebral arteries patent within the neck without stenosis, dissection, or occlusion. Skeleton: No visible acute osseous finding. No discrete or worrisome osseous lesions. Other neck: No other acute soft tissue abnormality within the neck. No mass or adenopathy. Upper chest: Visualized upper chest demonstrates no acute finding. Review of the MIP images confirms the above findings CTA HEAD FINDINGS Anterior circulation: Petrous segments widely patent. Scattered atheromatous plaque within the carotid siphons with no more than mild multifocal stenosis. A1 segments patent bilaterally. Normal anterior communicating artery complex. Anterior cerebral arteries patent to their distal aspects without stenosis. No M1 stenosis or occlusion. 5 mm aneurysm noted at the left MCA bifurcation (series 7, image 262). Right MCA bifurcation within normal limits. Distal MCA branches well perfused and symmetric. Posterior circulation: Both V4 segments patent to the vertebrobasilar junction without significant  stenosis. Left vertebral artery slightly dominant. Both PICA origins patent and normal. Basilar patent to its distal aspect without significant stenosis. Superior cerebellar arteries patent bilaterally. Both PCAs well perfused to their distal aspects without stenosis. Small bilateral posterior communicating arteries noted. Venous sinuses: Grossly patent allowing for timing the contrast bolus. Anatomic variants: None significant. Review of the MIP images confirms the above findings IMPRESSION: 1. Negative CTA for large vessel occlusion. 2. 70% atheromatous stenosis at the right carotid bulb/origin of the right ICA. 3. 50% atheromatous stenosis at the left carotid bulb/origin of the left ICA. 4. 5 mm left MCA bifurcation aneurysm. Electronically Signed   By: BJeannine BogaM.D.   On: 07/23/2020 21:55   CT HEAD WO CONTRAST  Result Date: 07/23/2020 CLINICAL DATA:  Neuro deficit, acute, stroke suspected. Additional history provided: Left-sided weakness in arm and leg. Slurred speech. EXAM: CT HEAD WITHOUT CONTRAST TECHNIQUE: Contiguous axial images were obtained from the base of the skull through the vertex without intravenous contrast. COMPARISON:  Head CT 02/28/2010. FINDINGS: Brain: Mild cerebral and cerebellar atrophy. Redemonstrated prominent perivascular spaces within  the inferior basal ganglia bilaterally. Patchy ill-defined hypoattenuation within the cerebral white matter, progressed in the right centrum semiovale and corona radiata as compared to the prior head CT of 02/28/2010. There is no acute intracranial hemorrhage. No demarcated cortical infarct. No extra-axial fluid collection. No evidence of intracranial mass. No midline shift. Vascular: No hyperdense vessel.  Atherosclerotic calcifications. Skull: Normal. Negative for fracture or focal lesion. Sinuses/Orbits: Visualized orbits show no acute finding. Trace scattered paranasal sinus mucosal thickening at the imaged levels. IMPRESSION: No acute  intracranial hemorrhage or acute demarcated cortical infarction. Patchy and ill-defined hypoattenuation within the bilateral cerebral white matter, progressed in the right centrum semiovale and corona radiata as compared to the prior head CT of 02/28/2010. Findings may reflect progressive chronic small vessel ischemic disease. However, given the provided history consider a brain MRI to exclude an acute white matter infarct at this site. Minimal paranasal sinus mucosal thickening at the imaged levels. Electronically Signed   By: Kellie Simmering DO   On: 07/23/2020 19:16   CT Angio Neck W and/or Wo Contrast  Result Date: 07/23/2020 CLINICAL DATA:  Initial evaluation for acute stroke, left-sided weakness with slurred speech. EXAM: CT ANGIOGRAPHY HEAD AND NECK TECHNIQUE: Multidetector CT imaging of the head and neck was performed using the standard protocol during bolus administration of intravenous contrast. Multiplanar CT image reconstructions and MIPs were obtained to evaluate the vascular anatomy. Carotid stenosis measurements (when applicable) are obtained utilizing NASCET criteria, using the distal internal carotid diameter as the denominator. CONTRAST:  48m OMNIPAQUE IOHEXOL 350 MG/ML SOLN COMPARISON:  Prior head CT from earlier the same day. FINDINGS: CTA NECK FINDINGS Aortic arch: Visualized aortic arch of normal caliber with normal branch pattern. Mild-to-moderate atheromatous change about the arch and origin of the great vessels without hemodynamically significant stenosis. Visualized subclavian arteries patent. Right carotid system: Right CCA patent from its origin to the bifurcation without stenosis. Eccentric calcified plaque at the right carotid bulb/proximal right ICA with associated stenosis of up to 70% by NASCET criteria. Right ICA patent distally without stenosis, dissection or occlusion. Left carotid system: Left CCA patent from its origin to the bifurcation without stenosis. Scattered calcified  plaque about the left carotid bulb/proximal left ICA with associated stenosis of up to 50% by NASCET criteria. Left ICA patent distally without stenosis, dissection or occlusion. Vertebral arteries: Both vertebral arteries arise from the subclavian arteries. Vertebral arteries patent within the neck without stenosis, dissection, or occlusion. Skeleton: No visible acute osseous finding. No discrete or worrisome osseous lesions. Other neck: No other acute soft tissue abnormality within the neck. No mass or adenopathy. Upper chest: Visualized upper chest demonstrates no acute finding. Review of the MIP images confirms the above findings CTA HEAD FINDINGS Anterior circulation: Petrous segments widely patent. Scattered atheromatous plaque within the carotid siphons with no more than mild multifocal stenosis. A1 segments patent bilaterally. Normal anterior communicating artery complex. Anterior cerebral arteries patent to their distal aspects without stenosis. No M1 stenosis or occlusion. 5 mm aneurysm noted at the left MCA bifurcation (series 7, image 262). Right MCA bifurcation within normal limits. Distal MCA branches well perfused and symmetric. Posterior circulation: Both V4 segments patent to the vertebrobasilar junction without significant stenosis. Left vertebral artery slightly dominant. Both PICA origins patent and normal. Basilar patent to its distal aspect without significant stenosis. Superior cerebellar arteries patent bilaterally. Both PCAs well perfused to their distal aspects without stenosis. Small bilateral posterior communicating arteries noted. Venous sinuses: Grossly patent allowing for  timing the contrast bolus. Anatomic variants: None significant. Review of the MIP images confirms the above findings IMPRESSION: 1. Negative CTA for large vessel occlusion. 2. 70% atheromatous stenosis at the right carotid bulb/origin of the right ICA. 3. 50% atheromatous stenosis at the left carotid bulb/origin of  the left ICA. 4. 5 mm left MCA bifurcation aneurysm. Electronically Signed   By: Jeannine Boga M.D.   On: 07/23/2020 21:55   MR BRAIN WO CONTRAST  Result Date: 07/24/2020 CLINICAL DATA:  Neuro deficit, acute, stroke suspected. EXAM: MRI HEAD WITHOUT CONTRAST TECHNIQUE: Multiplanar, multiecho pulse sequences of the brain and surrounding structures were obtained without intravenous contrast. COMPARISON:  CT angiogram head/neck 07/23/2020. Noncontrast head CT 07/23/2020. FINDINGS: Brain: Mild intermittent motion degradation, limiting evaluation. Mild cerebral atrophy. There are numerous small acute cortical/subcortical infarcts within the right cerebral hemisphere, predominantly within the right MCA territory, as well as right MCA/ACA and right MCA/PCA watershed territories. Infarcts are present within the right frontal lobe, parietal lobe, temporal lobe and temporal occipital junction. Additionally, there are several scattered tiny acute infarcts within the left frontal white matter within the left MCA/ACA watershed territory. Background moderate multifocal T2/FLAIR hyperintensity within the cerebral white matter which is nonspecific, but compatible with chronic small vessel ischemic disease. This includes several chronic lacunar infarcts within the bilateral frontal lobe white matter. Nonspecific chronic microhemorrhage within the posterior left temporal lobe (series 7, image 40) No evidence of intracranial mass. No extra-axial fluid collection. No midline shift. Partially empty sella turcica. Vascular: Expected proximal arterial flow voids. Skull and upper cervical spine: No focal marrow lesion. Incompletely assessed upper cervical spondylosis. Sinuses/Orbits: Visualized orbits show no acute finding. Mild mucosal thickening within the bilateral ethmoid and maxillary sinuses. IMPRESSION: Numerous small scattered acute cortical/subcortical infarcts within the right cerebral hemisphere, predominantly within  the right MCA territory, as well as right MCA/ACA and right MCA/PCA watershed territories. Infarcts are present within the right frontal lobe, parietal lobe, temporal lobe and temporal occipital junction. Additionally, there are several scattered tiny acute infarcts within the left frontal white matter within the left MCA/ACA watershed territory. The pattern of the above described infarcts is suggestive of an embolic process and clinical correlation is recommended. Background mild cerebral atrophy and mild-to-moderate chronic small vessel ischemic disease. Electronically Signed   By: Kellie Simmering DO   On: 07/24/2020 18:54   CARDIAC CATHETERIZATION  Result Date: 06/27/2020  Dist RCA lesion is 30% stenosed.  Mid Cx lesion is 30% stenosed.  Prox LAD to Mid LAD lesion is 20% stenosed.  Hemodynamics: Ao =  92/45 (63) LV =  100/4 Findings:  1. Mild nonobstructive CAD 2. 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 Will get cMRI to further characterize. D/w Dr. Johnsie Cancel. Glori Bickers, MD 2:09 PM   VAS [place] TRANSCRANIAL DOPPLER W BUBBLES  Result Date: 07/26/2020  Transcranial Doppler with Bubble Indications: Stroke. Comparison Study: no prior Performing Technologist: Abram Sander RVS  Examination Guidelines: A complete evaluation includes B-mode imaging, spectral Doppler, color Doppler, and power Doppler as needed of all accessible portions of each vessel. Bilateral testing is considered an integral part of a complete examination. Limited examinations for reoccurring indications may be performed as noted.  Summary:  HITS at rest: none HITS heard during valsalva: clinically insignificant Positive TCD Bubble study indicative of a trivial likely clinically insignificant right to left shunt with valasalva only *See table(s) above for TCD measurements and observations.  Diagnosing physician: Antony Contras MD Electronically  signed by Antony Contras MD on 07/26/2020 at 1:04:20 PM.    Final    ECHOCARDIOGRAM COMPLETE  Result Date: 07/24/2020    ECHOCARDIOGRAM REPORT   Patient Name:   [name] [name] Date of Exam: 07/24/2020 Medical Rec #:  [id number]    Height:       68.0 in Accession #:    [id number]   Weight:       145.2 lb Date of Birth:  1951/01/07   BSA:          1.784 m Patient Age:    35 years     BP:           92/49 mmHg Patient Gender: F            HR:           69 bpm. Exam Location:  Inpatient Procedure: 2D Echo, Cardiac Doppler, Color Doppler and Intracardiac            Opacification Agent Indications:    Stroke  History:        Patient has prior history of Echocardiogram examinations, most                 recent 03/14/2020.  Sonographer:    Merrie Roof RDCS Referring Phys: [id number] [facility]  Sonographer Comments: Technically difficult IMPRESSIONS  1. Left ventricular ejection fraction, by estimation, is 60 to 65%. The left ventricle has normal function. The left ventricle has no regional wall motion abnormalities. Left ventricular diastolic parameters are consistent with Grade II diastolic dysfunction (pseudonormalization). Elevated left atrial pressure.  2. Right ventricular systolic function is normal. The right ventricular size is normal.  3. The mitral valve is normal in structure. No evidence of mitral valve regurgitation. No evidence of mitral stenosis.  4. The aortic valve is normal in structure. There is moderate calcification of the aortic valve. There is moderate thickening of the aortic valve. Aortic valve regurgitation is not visualized. No aortic stenosis is present.  5. The inferior vena cava is normal in size with greater than 50% respiratory variability, suggesting right atrial pressure of 3 mmHg. FINDINGS  Left Ventricle: Left ventricular ejection fraction, by estimation, is 60 to 65%. The left ventricle has normal function. The left ventricle has no regional wall motion abnormalities. The left  ventricular internal cavity size was normal in size. There is  no left ventricular hypertrophy. Left ventricular diastolic parameters are consistent with Grade II diastolic dysfunction (pseudonormalization). Elevated left atrial pressure. Right Ventricle: The right ventricular size is normal. No increase in right ventricular wall thickness. Right ventricular systolic function is normal. Left Atrium: Left atrial size was normal in size. Right Atrium: Right atrial size was normal in size. Pericardium: There is no evidence of pericardial effusion. Mitral Valve: The mitral valve is normal in structure. No evidence of mitral valve regurgitation. No evidence of mitral valve stenosis. Tricuspid Valve: The tricuspid valve is normal in structure. Tricuspid valve regurgitation is not demonstrated. No evidence of tricuspid stenosis. Aortic Valve: The aortic valve is normal in structure. There is moderate calcification of the aortic valve. There is moderate thickening of the aortic valve. Aortic valve regurgitation is not visualized. No aortic stenosis is present. Aortic valve mean gradient measures 15.0 mmHg. Aortic valve peak gradient measures 24.4 mmHg. Aortic valve area, by VTI measures 1.18 cm. Pulmonic Valve: The pulmonic valve was normal in structure. Pulmonic valve regurgitation is not visualized. No evidence of  pulmonic stenosis. Aorta: The aortic root is normal in size and structure. Venous: The inferior vena cava is normal in size with greater than 50% respiratory variability, suggesting right atrial pressure of 3 mmHg. IAS/Shunts: No atrial level shunt detected by color flow Doppler.  LEFT VENTRICLE PLAX 2D LVIDd:         4.70 cm      Diastology LVIDs:         3.30 cm      LV e' medial:    3.81 cm/s LV PW:         1.20 cm      LV E/e' medial:  21.3 LV IVS:        1.30 cm      LV e' lateral:   5.00 cm/s LVOT diam:     2.25 cm      LV E/e' lateral: 16.2 LV SV:         63 LV SV Index:   35 LVOT Area:     3.98 cm  LV  Volumes (MOD) LV vol d, MOD A2C: 250.0 ml LV vol d, MOD A4C: 185.0 ml LV vol s, MOD A2C: 137.0 ml LV vol s, MOD A4C: 105.0 ml LV SV MOD A2C:     113.0 ml LV SV MOD A4C:     185.0 ml LV SV MOD BP:      102.4 ml LEFT ATRIUM             Index       RIGHT ATRIUM           Index LA diam:        2.80 cm 1.57 cm/m  RA Area:     16.00 cm LA Vol (A2C):   88.4 ml 49.56 ml/m RA Volume:   32.50 ml  18.22 ml/m LA Vol (A4C):   94.1 ml 52.76 ml/m LA Biplane Vol: 95.4 ml 53.48 ml/m  AORTIC VALVE AV Area (Vmax):    1.32 cm AV Area (Vmean):   1.20 cm AV Area (VTI):     1.18 cm AV Vmax:           247.00 cm/s AV Vmean:          186.000 cm/s AV VTI:            0.535 m AV Peak Grad:      24.4 mmHg AV Mean Grad:      15.0 mmHg LVOT Vmax:         81.84 cm/s LVOT Vmean:        56.223 cm/s LVOT VTI:          0.159 m LVOT/AV VTI ratio: 0.30  AORTA Ao Root diam: 3.00 cm MITRAL VALVE MV Area (PHT): 2.24 cm    SHUNTS MV Decel Time: 338 msec    Systemic VTI:  0.16 m MV E velocity: 81.00 cm/s  Systemic Diam: 2.25 cm MV A velocity: 94.30 cm/s MV E/A ratio:  0.86 Mihai Croitoru MD Electronically signed by Sanda Klein MD Signature Date/Time: 07/24/2020/1:57:35 PM    Final    (Echo, Carotid, EGD, Colonoscopy, ERCP)    Subjective: Patient seen and examined.  No overnight events.  She was able to receive some premixed TPN overnight.  Eager to go home.  Left hand is still weaker but slightly improved than before.   Discharge Exam: Vitals:   07/26/20 0700 07/26/20 1100  BP: 98/79 104/70  Pulse: (!) 58 68  Resp: 14 16  Temp: (!)  97.5 F (36.4 C) 98.1 F (36.7 C)  SpO2: 98% 100%   Vitals:   07/26/20 0010 07/26/20 0318 07/26/20 0700 07/26/20 1100  BP: (!) 105/51 (!) 97/54 98/79 104/70  Pulse: (!) 59 (!) 56 (!) 58 68  Resp:   14 16  Temp: 97.7 F (36.5 C) 97.8 F (36.6 C) (!) 97.5 F (36.4 C) 98.1 F (36.7 C)  TempSrc: Oral Oral Oral Oral  SpO2: 95% 96% 98% 100%  Weight:      Height:        General: Pt is alert,  awake, not in acute distress Cardiovascular: RRR, S1/S2 +, no rubs, no gallops Respiratory: CTA bilaterally, no wheezing, no rhonchi Abdominal: Soft, NT, ND, bowel sounds + Extremities: no edema, no cyanosis Neurological exam: Cranial nerves no deficit. Left upper extremity 4/5. Left lower extremity 4/5. Right side is normal. Tunneled permacath present on the left chest.    The results of significant diagnostics from this hospitalization (including imaging, microbiology, ancillary and laboratory) are listed below for reference.     Microbiology: Recent Results (from the past 240 hour(s))  Resp Panel by RT-PCR (Flu A&B, Covid) Nasopharyngeal Swab     Status: None   Collection Time: 07/23/20  7:20 PM   Specimen: Nasopharyngeal Swab; Nasopharyngeal(NP) swabs in vial transport medium  Result Value Ref Range Status   SARS Coronavirus 2 by RT PCR NEGATIVE NEGATIVE Final    Comment: (NOTE) SARS-CoV-2 target nucleic acids are NOT DETECTED.  The SARS-CoV-2 RNA is generally detectable in upper respiratory specimens during the acute phase of infection. The lowest concentration of SARS-CoV-2 viral copies this assay can detect is 138 copies/mL. A negative result does not preclude SARS-Cov-2 infection and should not be used as the sole basis for treatment or other patient management decisions. A negative result may occur with  improper specimen collection/handling, submission of specimen other than nasopharyngeal swab, presence of viral mutation(s) within the areas targeted by this assay, and inadequate number of viral copies(<138 copies/mL). A negative result must be combined with clinical observations, patient history, and epidemiological information. The expected result is Negative.  Fact Sheet for Patients:  EntrepreneurPulse.com.au  Fact Sheet for Healthcare Providers:  IncredibleEmployment.be  This test is no t yet approved or cleared by the  [place] FDA and  has been authorized for detection and/or diagnosis of SARS-CoV-2 by FDA under an Emergency Use Authorization (EUA). This EUA will remain  in effect (meaning this test can be used) for the duration of the COVID-19 declaration under Section 564(b)(1) of the Act, 21 U.S.C.section 360bbb-3(b)(1), unless the authorization is terminated  or revoked sooner.       Influenza A by PCR NEGATIVE NEGATIVE Final   Influenza B by PCR NEGATIVE NEGATIVE Final    Comment: (NOTE) The Xpert Xpress SARS-CoV-2/FLU/RSV plus assay is intended as an aid in the diagnosis of influenza from Nasopharyngeal swab specimens and should not be used as a sole basis for treatment. Nasal washings and aspirates are unacceptable for Xpert Xpress SARS-CoV-2/FLU/RSV testing.  Fact Sheet for Patients: EntrepreneurPulse.com.au  Fact Sheet for Healthcare Providers: IncredibleEmployment.be  This test is not yet approved or cleared by the [place] FDA and has been authorized for detection and/or diagnosis of SARS-CoV-2 by FDA under an Emergency Use Authorization (EUA). This EUA will remain in effect (meaning this test can be used) for the duration of the COVID-19 declaration under Section 564(b)(1) of the Act, 21 U.S.C. section 360bbb-3(b)(1), unless the authorization is terminated or  revoked.  Performed at [facility], [address] [place], [place] [address]      Labs: BNP (last 3 results) No results for input(s): BNP in the last 8760 hours. Basic Metabolic Panel: Recent Labs  Lab 07/23/20 1920 07/24/20 0812 07/25/20 0415 07/26/20 0453  NA 133* 135 136 135  K 3.0* 3.0* 3.1* 4.0  CL 112* 115* 116* 114*  CO2 14* 14* 14* 17*  GLUCOSE 81 83 85 116*  BUN 10 10 7* 14  CREATININE 1.26* 1.32* 1.34* 1.14*  CALCIUM 7.9* 8.0* 8.1* 8.3*  MG 1.7  --  2.0 1.9  PHOS  --   --  3.5 2.4*   Liver Function Tests: Recent Labs  Lab  07/23/20 1920 07/24/20 0812 07/26/20 0453  AST _0 ALT _1 ALKPHOS 94 91 83  BILITOT 0.3 0.8 0.8  PROT 7.2 6.8 6.9  ALBUMIN 2.9* 2.5* 2.6*   No results for input(s): LIPASE, AMYLASE in the last 168 hours. No results for input(s): AMMONIA in the last 168 hours. CBC: Recent Labs  Lab 07/23/20 1920 07/26/20 0453  WBC 10.7* 8.0  NEUTROABS 9.0* 3.9  HGB 9.3* 8.8*  HCT 29.3* 28.3*  MCV 100.0 99.3  PLT 115* 102*   Cardiac Enzymes: No results for input(s): CKTOTAL, CKMB, CKMBINDEX, TROPONINI in the last 168 hours. BNP: Invalid input(s): POCBNP CBG: Recent Labs  Lab 07/24/20 1435 07/24/20 1501 07/24/20 1657 07/26/20 0753 07/26/20 1123  GLUCAP 61* 64* 62* 162* 122*   D-Dimer No results for input(s): DDIMER in the last 72 hours. Hgb A1c Recent Labs    07/24/20 0501  HGBA1C 5.4   Lipid Profile Recent Labs    07/24/20 0501 07/26/20 0453  CHOL 72  --   HDL 17*  --   LDLCALC 20  --   TRIG 175* 101  CHOLHDL 4.2  --    Thyroid function studies No results for input(s): TSH, T4TOTAL, T3FREE, THYROIDAB in the last 72 hours.  Invalid input(s): FREET3 Anemia work up No results for input(s): VITAMINB12, FOLATE, FERRITIN, TIBC, IRON, RETICCTPCT in the last 72 hours. Urinalysis    Component Value Date/Time   COLORURINE YELLOW 04/28/2020 1325   APPEARANCEUR CLEAR 04/28/2020 1325   LABSPEC 1.015 04/28/2020 1325   PHURINE 6.0 04/28/2020 1325   GLUCOSEU NEGATIVE 04/28/2020 1325   HGBUR TRACE (A) 04/28/2020 1325   HGBUR trace-intact 04/12/2010 0958   BILIRUBINUR NEGATIVE 04/28/2020 1325   BILIRUBINUR neg 04/08/2011 0947   KETONESUR NEGATIVE 04/28/2020 1325   PROTEINUR NEGATIVE 04/28/2020 1325   UROBILINOGEN 0.2 09/17/2012 1122   NITRITE NEGATIVE 04/28/2020 1325   LEUKOCYTESUR NEGATIVE 04/28/2020 1325   Sepsis Labs Invalid input(s): PROCALCITONIN,  WBC,  LACTICIDVEN Microbiology Recent Results (from the past 240 hour(s))  Resp Panel by RT-PCR (Flu  A&B, Covid) Nasopharyngeal Swab     Status: None   Collection Time: 07/23/20  7:20 PM   Specimen: Nasopharyngeal Swab; Nasopharyngeal(NP) swabs in vial transport medium  Result Value Ref Range Status   SARS Coronavirus 2 by RT PCR NEGATIVE NEGATIVE Final    Comment: (NOTE) SARS-CoV-2 target nucleic acids are NOT DETECTED.  The SARS-CoV-2 RNA is generally detectable in upper respiratory specimens during the acute phase of infection. The lowest concentration of SARS-CoV-2 viral copies this assay can detect is 138 copies/mL. A negative result does not preclude SARS-Cov-2 infection and should not be used as the sole basis for treatment or other patient management decisions. A  negative result may occur with  improper specimen collection/handling, submission of specimen other than nasopharyngeal swab, presence of viral mutation(s) within the areas targeted by this assay, and inadequate number of viral copies(<138 copies/mL). A negative result must be combined with clinical observations, patient history, and epidemiological information. The expected result is Negative.  Fact Sheet for Patients:  EntrepreneurPulse.com.au  Fact Sheet for Healthcare Providers:  IncredibleEmployment.be  This test is no t yet approved or cleared by the [place] FDA and  has been authorized for detection and/or diagnosis of SARS-CoV-2 by FDA under an Emergency Use Authorization (EUA). This EUA will remain  in effect (meaning this test can be used) for the duration of the COVID-19 declaration under Section 564(b)(1) of the Act, 21 U.S.C.section 360bbb-3(b)(1), unless the authorization is terminated  or revoked sooner.       Influenza A by PCR NEGATIVE NEGATIVE Final   Influenza B by PCR NEGATIVE NEGATIVE Final    Comment: (NOTE) The Xpert Xpress SARS-CoV-2/FLU/RSV plus assay is intended as an aid in the diagnosis of influenza from Nasopharyngeal swab specimens  and should not be used as a sole basis for treatment. Nasal washings and aspirates are unacceptable for Xpert Xpress SARS-CoV-2/FLU/RSV testing.  Fact Sheet for Patients: EntrepreneurPulse.com.au  Fact Sheet for Healthcare Providers: IncredibleEmployment.be  This test is not yet approved or cleared by the [place] FDA and has been authorized for detection and/or diagnosis of SARS-CoV-2 by FDA under an Emergency Use Authorization (EUA). This EUA will remain in effect (meaning this test can be used) for the duration of the COVID-19 declaration under Section 564(b)(1) of the Act, 21 U.S.C. section 360bbb-3(b)(1), unless the authorization is terminated or revoked.  Performed at [facility], [address], [place], [place] [address]      Time coordinating discharge:  32 minutes  SIGNED:   Barb Merino, MD  [facility] 07/26/2020, 1:20 PM

## 2020-07-26 NOTE — Progress Notes (Signed)
Physical Therapy Treatment Patient Details Name: [name] [name] MRN: [id number] DOB: 05-18-1950 Today's Date: 07/26/2020    History of Present Illness [name] [name] is a 70 yo female who presents with L UE and LE weakness and slurred speech. CT neg, awaiting MRI.  PMH:  hypercoagulable state, history of ischemic gut status post resection, short gut syndrome, anemia of chronic disease, chronic diastolic CHF, carotid stenosis, atherosclerosis.    PT Comments    Patient progressing towards physical therapy goals. Patient ambulated 150' with SPC and min guard for safety and balance. Performed dynamic reaching tasks with no UE support and close supervision, no overt LOB noted. Continue to recommend HHPT following discharge to maximize functional mobility and safety.     Follow Up Recommendations  Home health PT;Supervision for mobility/OOB     Equipment Recommendations  Cane    Recommendations for Other Services       Precautions / Restrictions Precautions Precautions: Fall Restrictions Weight Bearing Restrictions: No    Mobility  Bed Mobility               General bed mobility comments: sitting EOB on arrival    Transfers Overall transfer level: Needs assistance Equipment used: Straight cane Transfers: Sit to/from Stand Sit to Stand: Supervision         General transfer comment: supervision for safety, patient able to self correct mild unsteadiness  Ambulation/Gait Ambulation/Gait assistance: Min guard Gait Distance (Feet): 150 Feet Assistive device: Straight cane Gait Pattern/deviations: Step-through pattern;Ataxic Gait velocity: decreased   General Gait Details: ambulated with SPC. Mild ataxia and unsteadiness noted   Stairs Stairs: Yes Stairs assistance: Supervision Stair Management: No rails;Step to pattern;Forwards (HHAx2) Number of Stairs: 2     Wheelchair Mobility    Modified Rankin (Stroke Patients Only) Modified Rankin (Stroke Patients  Only) Pre-Morbid Rankin Score: No symptoms Modified Rankin: Moderately severe disability     Balance Overall balance assessment: Needs assistance Sitting-balance support: No upper extremity supported;Feet supported Sitting balance-Leahy Scale: Good     Standing balance support: No upper extremity supported;During functional activity Standing balance-Leahy Scale: Fair Standing balance comment: dynamic reaching tasks with no UE support tasks and close supervision                            Cognition Arousal/Alertness: Awake/alert Behavior During Therapy: WFL for tasks assessed/performed Overall Cognitive Status: Within Functional Limits for tasks assessed                                        Exercises General Exercises - Lower Extremity Hip Flexion/Marching: 10 reps;Standing Mini-Sqauts: 10 reps    General Comments        Pertinent Vitals/Pain Pain Assessment: 0-10 Pain Score: 1  Pain Location: L leg Pain Descriptors / Indicators:  (aggravating) Pain Intervention(s): Monitored during session    Home Living                      Prior Function            PT Goals (current goals can now be found in the care plan section) Acute Rehab PT Goals Patient Stated Goal: to go home PT Goal Formulation: With patient Time For Goal Achievement: 08/07/20 Potential to Achieve Goals: Good Progress towards PT goals: Progressing toward goals    Frequency  Min 4X/week      PT Plan Current plan remains appropriate    Co-evaluation              AM-PAC PT "6 Clicks" Mobility   Outcome Measure  Help needed turning from your back to your side while in a flat bed without using bedrails?: None Help needed moving from lying on your back to sitting on the side of a flat bed without using bedrails?: None Help needed moving to and from a bed to a chair (including a wheelchair)?: A Little Help needed standing up from a chair using your  arms (e.g., wheelchair or bedside chair)?: A Little Help needed to walk in hospital room?: A Little Help needed climbing 3-5 steps with a railing? : A Little 6 Click Score: 20    End of Session   Activity Tolerance: Patient tolerated treatment well Patient left: in bed;with call bell/phone within reach;with family/visitor present Nurse Communication: Mobility status PT Visit Diagnosis: Unsteadiness on feet (R26.81);Ataxic gait (R26.0)     Time: 7460-0298 PT Time Calculation (min) (ACUTE ONLY): 20 min  Charges:  $Therapeutic Activity: 8-22 mins                     [name] [name] A. [name] [name] PT, DPT Acute Rehabilitation Services Pager [phone] Office [phone]    [name] [name] 07/26/2020, 11:02 AM

## 2020-07-27 ENCOUNTER — Telehealth: Payer: Self-pay | Admitting: Family Medicine

## 2020-07-27 DIAGNOSIS — I5042 Chronic combined systolic (congestive) and diastolic (congestive) heart failure: Secondary | ICD-10-CM | POA: Diagnosis not present

## 2020-07-27 DIAGNOSIS — I69328 Other speech and language deficits following cerebral infarction: Secondary | ICD-10-CM | POA: Diagnosis not present

## 2020-07-27 DIAGNOSIS — D61818 Other pancytopenia: Secondary | ICD-10-CM | POA: Diagnosis not present

## 2020-07-27 DIAGNOSIS — I69354 Hemiplegia and hemiparesis following cerebral infarction affecting left non-dominant side: Secondary | ICD-10-CM | POA: Diagnosis not present

## 2020-07-27 DIAGNOSIS — R161 Splenomegaly, not elsewhere classified: Secondary | ICD-10-CM | POA: Diagnosis not present

## 2020-07-27 DIAGNOSIS — Z87311 Personal history of (healed) other pathological fracture: Secondary | ICD-10-CM | POA: Diagnosis not present

## 2020-07-27 DIAGNOSIS — E559 Vitamin D deficiency, unspecified: Secondary | ICD-10-CM | POA: Diagnosis not present

## 2020-07-27 DIAGNOSIS — K912 Postsurgical malabsorption, not elsewhere classified: Secondary | ICD-10-CM | POA: Diagnosis not present

## 2020-07-27 DIAGNOSIS — Z7901 Long term (current) use of anticoagulants: Secondary | ICD-10-CM | POA: Diagnosis not present

## 2020-07-27 DIAGNOSIS — Z87891 Personal history of nicotine dependence: Secondary | ICD-10-CM | POA: Diagnosis not present

## 2020-07-27 DIAGNOSIS — Z9181 History of falling: Secondary | ICD-10-CM | POA: Diagnosis not present

## 2020-07-27 DIAGNOSIS — M810 Age-related osteoporosis without current pathological fracture: Secondary | ICD-10-CM | POA: Diagnosis not present

## 2020-07-27 DIAGNOSIS — Z7951 Long term (current) use of inhaled steroids: Secondary | ICD-10-CM | POA: Diagnosis not present

## 2020-07-27 DIAGNOSIS — Z86010 Personal history of colonic polyps: Secondary | ICD-10-CM | POA: Diagnosis not present

## 2020-07-27 DIAGNOSIS — Z8781 Personal history of (healed) traumatic fracture: Secondary | ICD-10-CM | POA: Diagnosis not present

## 2020-07-27 DIAGNOSIS — R739 Hyperglycemia, unspecified: Secondary | ICD-10-CM | POA: Diagnosis not present

## 2020-07-27 DIAGNOSIS — D6869 Other thrombophilia: Secondary | ICD-10-CM | POA: Diagnosis not present

## 2020-07-27 DIAGNOSIS — N183 Chronic kidney disease, stage 3 unspecified: Secondary | ICD-10-CM | POA: Diagnosis not present

## 2020-07-27 DIAGNOSIS — I6523 Occlusion and stenosis of bilateral carotid arteries: Secondary | ICD-10-CM | POA: Diagnosis not present

## 2020-07-27 DIAGNOSIS — D631 Anemia in chronic kidney disease: Secondary | ICD-10-CM | POA: Diagnosis not present

## 2020-07-27 DIAGNOSIS — Z9049 Acquired absence of other specified parts of digestive tract: Secondary | ICD-10-CM | POA: Diagnosis not present

## 2020-07-27 LAB — FATTY ACID PANEL, ESSENTIAL (C12-C22), SERUM
Arachidic, C20:0: 5.02 umol/L — ABNORMAL LOW (ref 16.8–38.5)
Arachidonic, C20:4w6: 295.38 umol/L — ABNORMAL LOW (ref 351.4–1057)
DHA, C22:6w3: 42.14 umol/L — ABNORMAL LOW (ref 53.3–216.3)
DPA, C22:5w3: 21.51 umol/L (ref 19.9–77.6)
DPA, C22:5w6: 12.26 umol/L (ref 9.2–32.1)
DTA, C22:4w6: 13.66 umol/L (ref 9.1–44.6)
Docosenoic, C22:1: 4.67 umol/L — ABNORMAL LOW (ref 5.73–11.92)
EPA, C20:5w3: 12.69 umol/L (ref 12.4–123)
Hexadecenoic, C16:1w9: 51.71 umol/L (ref 19.82–59.9)
LAURIC,C12:0: 12.55 umol/L (ref 4.3–36)
Linoleic, C18:2w6: 1499.61 umol/L — ABNORMAL LOW (ref 2653.4–613)
Mead, C20:3w9: 10.17 umol/L — ABNORMAL LOW (ref 10.3–41.3)
Myristic, C14:0: 83.68 umol/L (ref 39.4–258.2)
Nervonic, C24:1w9: 73.35 umol/L (ref 56.9–132.7)
Oleic, C18:1w9: 1816.13 umol/L (ref 872.4–4182)
Palmitic, C16:0: 1913.97 umol/L (ref 1671.1–460)
Palmitoleic, C16:1w7: 175.07 umol/L (ref 68.5–570.2)
Stearic, C18:0: 589.77 umol/L — ABNORMAL LOW (ref 590.2–1377)
Total Fatty Acids: 6.96 umol/L — ABNORMAL LOW (ref 7.93–18.34)
Total Monounsaturated: 2.28 umol/L (ref 1.45–4.95)
Total Polyunsaturated: 2.03 umol/L — ABNORMAL LOW (ref 3.57–8.11)
Total Saturated: 2.65 umol/L (ref 2.5–6.4)
Total w3: 0.1 umol/L (ref 0.1–0.5)
Total w6: 1.92 umol/L — ABNORMAL LOW (ref 3.3–7.1)
Triene Tetraene Ratio: 0.03 (ref 0.02–0.05)
Vaccenic, C18:1w7: 144.56 umol/L (ref 84.8–260.8)
a Linolenic, C18:3w3: 25.2 umol/L — ABNORMAL LOW (ref 26.1–150.1)
g Linolenic, C18:3w6: 30.79 umol/L (ref 14.5–144.9)
h g-Linolenic, C20:3w6: 66.07 umol/L — ABNORMAL LOW (ref 70–319.9)

## 2020-07-27 LAB — PREALBUMIN: Prealbumin: 15 mg/dL — ABNORMAL LOW (ref 17–34)

## 2020-07-27 NOTE — Telephone Encounter (Signed)
Called and left message on vm giving the verbal order for PT per your order to proceed with therapy.

## 2020-07-27 NOTE — Telephone Encounter (Signed)
Ok for verbal orders ?

## 2020-07-27 NOTE — Telephone Encounter (Signed)
..  Home Health Verbal Orders  Agency:  [facility] Caller: Contact and title   Requesting OT/ PT/ Skilled nursing/ Social Work/ Speech:    Reason for Request:  Needs home order for physical therapy   Frequency:   1 wk x 1, 2 wk x 4, 1 wk x 4  HH needs F2F w/in last 30 days

## 2020-07-27 NOTE — Telephone Encounter (Signed)
Claiborne Billings from [facility] called wanting verbal order for OT/PT/Skilled Nursing/Social Work/Speech Therapy for 1x wk for 1wk , 2x wk for 4wks, 1xwk for 4wks. F2F w/in last 30 days.

## 2020-07-27 NOTE — Telephone Encounter (Signed)
[facility] , fax# [phone]

## 2020-07-27 NOTE — Telephone Encounter (Signed)
[facility] provided order to resume TPN tx.

## 2020-07-31 ENCOUNTER — Inpatient Hospital Stay (HOSPITAL_COMMUNITY): Admission: RE | Admit: 2020-07-31 | Payer: Medicare Other | Source: Ambulatory Visit

## 2020-07-31 DIAGNOSIS — K912 Postsurgical malabsorption, not elsewhere classified: Secondary | ICD-10-CM | POA: Diagnosis not present

## 2020-07-31 DIAGNOSIS — D6869 Other thrombophilia: Secondary | ICD-10-CM | POA: Diagnosis not present

## 2020-07-31 DIAGNOSIS — I69354 Hemiplegia and hemiparesis following cerebral infarction affecting left non-dominant side: Secondary | ICD-10-CM | POA: Diagnosis not present

## 2020-07-31 DIAGNOSIS — I5042 Chronic combined systolic (congestive) and diastolic (congestive) heart failure: Secondary | ICD-10-CM | POA: Diagnosis not present

## 2020-07-31 DIAGNOSIS — R739 Hyperglycemia, unspecified: Secondary | ICD-10-CM | POA: Diagnosis not present

## 2020-07-31 DIAGNOSIS — I69328 Other speech and language deficits following cerebral infarction: Secondary | ICD-10-CM | POA: Diagnosis not present

## 2020-08-01 ENCOUNTER — Other Ambulatory Visit: Payer: Self-pay | Admitting: Cardiovascular Disease

## 2020-08-01 DIAGNOSIS — I69328 Other speech and language deficits following cerebral infarction: Secondary | ICD-10-CM | POA: Diagnosis not present

## 2020-08-01 DIAGNOSIS — D6869 Other thrombophilia: Secondary | ICD-10-CM | POA: Diagnosis not present

## 2020-08-01 DIAGNOSIS — R739 Hyperglycemia, unspecified: Secondary | ICD-10-CM | POA: Diagnosis not present

## 2020-08-01 DIAGNOSIS — K912 Postsurgical malabsorption, not elsewhere classified: Secondary | ICD-10-CM | POA: Diagnosis not present

## 2020-08-01 DIAGNOSIS — I69354 Hemiplegia and hemiparesis following cerebral infarction affecting left non-dominant side: Secondary | ICD-10-CM | POA: Diagnosis not present

## 2020-08-01 DIAGNOSIS — I5042 Chronic combined systolic (congestive) and diastolic (congestive) heart failure: Secondary | ICD-10-CM | POA: Diagnosis not present

## 2020-08-03 DIAGNOSIS — D6869 Other thrombophilia: Secondary | ICD-10-CM | POA: Diagnosis not present

## 2020-08-03 DIAGNOSIS — I69354 Hemiplegia and hemiparesis following cerebral infarction affecting left non-dominant side: Secondary | ICD-10-CM | POA: Diagnosis not present

## 2020-08-03 DIAGNOSIS — I5042 Chronic combined systolic (congestive) and diastolic (congestive) heart failure: Secondary | ICD-10-CM | POA: Diagnosis not present

## 2020-08-03 DIAGNOSIS — K912 Postsurgical malabsorption, not elsewhere classified: Secondary | ICD-10-CM | POA: Diagnosis not present

## 2020-08-03 DIAGNOSIS — I69328 Other speech and language deficits following cerebral infarction: Secondary | ICD-10-CM | POA: Diagnosis not present

## 2020-08-03 DIAGNOSIS — R739 Hyperglycemia, unspecified: Secondary | ICD-10-CM | POA: Diagnosis not present

## 2020-08-06 DIAGNOSIS — K912 Postsurgical malabsorption, not elsewhere classified: Secondary | ICD-10-CM | POA: Diagnosis not present

## 2020-08-06 DIAGNOSIS — I5042 Chronic combined systolic (congestive) and diastolic (congestive) heart failure: Secondary | ICD-10-CM | POA: Diagnosis not present

## 2020-08-06 DIAGNOSIS — I69328 Other speech and language deficits following cerebral infarction: Secondary | ICD-10-CM | POA: Diagnosis not present

## 2020-08-06 DIAGNOSIS — I69354 Hemiplegia and hemiparesis following cerebral infarction affecting left non-dominant side: Secondary | ICD-10-CM | POA: Diagnosis not present

## 2020-08-06 DIAGNOSIS — D6869 Other thrombophilia: Secondary | ICD-10-CM | POA: Diagnosis not present

## 2020-08-06 DIAGNOSIS — R739 Hyperglycemia, unspecified: Secondary | ICD-10-CM | POA: Diagnosis not present

## 2020-08-07 DIAGNOSIS — I69354 Hemiplegia and hemiparesis following cerebral infarction affecting left non-dominant side: Secondary | ICD-10-CM | POA: Diagnosis not present

## 2020-08-07 DIAGNOSIS — R739 Hyperglycemia, unspecified: Secondary | ICD-10-CM | POA: Diagnosis not present

## 2020-08-07 DIAGNOSIS — I5042 Chronic combined systolic (congestive) and diastolic (congestive) heart failure: Secondary | ICD-10-CM | POA: Diagnosis not present

## 2020-08-07 DIAGNOSIS — K912 Postsurgical malabsorption, not elsewhere classified: Secondary | ICD-10-CM | POA: Diagnosis not present

## 2020-08-07 DIAGNOSIS — I69328 Other speech and language deficits following cerebral infarction: Secondary | ICD-10-CM | POA: Diagnosis not present

## 2020-08-07 DIAGNOSIS — D6869 Other thrombophilia: Secondary | ICD-10-CM | POA: Diagnosis not present

## 2020-08-08 ENCOUNTER — Telehealth: Payer: Self-pay | Admitting: Family Medicine

## 2020-08-08 NOTE — Telephone Encounter (Signed)
Called and left message on vm to give the verbal order for PT. Also informed HHN that patient has not been seen I the last 30 days.

## 2020-08-08 NOTE — Telephone Encounter (Signed)
Emeline Darling called to get verbal orders on patient

## 2020-08-08 NOTE — Telephone Encounter (Signed)
[facility] for verbal orders but I have not seen patient recently.  Will need to schedule an appt

## 2020-08-08 NOTE — Telephone Encounter (Signed)
..  Home Health Verbal Orders  Agency:  [facility]  Caller:  Pola Corn and title   [phone] (confidential voice mail)  Requesting OT/ PT/ Skilled nursing/ Social Work/ Speech:  Home Health OT  Reason for Request:  Occupational therapy following stroke  Frequency:  1 x week for 1 week, then 2 x a week for 3 weeks.   HH needs F2F w/in last 30 days

## 2020-08-09 DIAGNOSIS — D6869 Other thrombophilia: Secondary | ICD-10-CM | POA: Diagnosis not present

## 2020-08-09 DIAGNOSIS — I5042 Chronic combined systolic (congestive) and diastolic (congestive) heart failure: Secondary | ICD-10-CM | POA: Diagnosis not present

## 2020-08-09 DIAGNOSIS — K912 Postsurgical malabsorption, not elsewhere classified: Secondary | ICD-10-CM | POA: Diagnosis not present

## 2020-08-09 DIAGNOSIS — I69328 Other speech and language deficits following cerebral infarction: Secondary | ICD-10-CM | POA: Diagnosis not present

## 2020-08-09 DIAGNOSIS — I69354 Hemiplegia and hemiparesis following cerebral infarction affecting left non-dominant side: Secondary | ICD-10-CM | POA: Diagnosis not present

## 2020-08-09 DIAGNOSIS — R739 Hyperglycemia, unspecified: Secondary | ICD-10-CM | POA: Diagnosis not present

## 2020-08-10 ENCOUNTER — Telehealth: Payer: Self-pay | Admitting: Family Medicine

## 2020-08-10 ENCOUNTER — Telehealth: Payer: Self-pay | Admitting: Internal Medicine

## 2020-08-10 ENCOUNTER — Telehealth (HOSPITAL_COMMUNITY): Payer: Self-pay | Admitting: Emergency Medicine

## 2020-08-10 ENCOUNTER — Other Ambulatory Visit: Payer: Self-pay | Admitting: *Deleted

## 2020-08-10 NOTE — Telephone Encounter (Signed)
..  Home Health Certification or Plan of Care Tracking  Is this a Certification or Plan of Harmony:  [place] Number: [id number]  Has charge sheet been attached? yes  Where has form been placed:  Up front in Dr. Virgil Benedict bin

## 2020-08-10 NOTE — Telephone Encounter (Signed)
Verbal orders provided to the pharmacy

## 2020-08-10 NOTE — Patient Outreach (Signed)
[place] [facility]) Care Management  08/10/2020  [name] [name] [birth year] [id number]   RED ON EMMI ALERT - Stroke Day # 13 Date: 3/31 Red Alert Reason: Not had follow up appointment   Outreach attempt #1, successful.  Identity verified.  This care manager introduced self and stated purpose of call.  [facility] care management services explained.    She report she has been doing well, working [facility] and OT.  Has not been to follow up with PCP yet, will call to schedule.  She does have appointments scheduled with Cardiology for office visit and cardiac MRI on 4/4, will also see neurology on 4/14.  Denies need for transportation resources.  Also discussed medications, denies any questions as there were no changes. No concern for medication cost.  Denies any ongoing needs, has husband in the home to help if needed.  Made aware of RN assigned to PCP office for chronic care management, will provide update on member's case and request follow going forward.  Plan: RN CM will close case at this time, no further needs identified.  [name], [place], MSN [place] [phone]

## 2020-08-10 NOTE — Telephone Encounter (Signed)
Remo Lipps from [facility] called said the patient also receives a liter of hydration which is now expired request that we send a new order for it. Tel: [phone] he is asking that you call him today.

## 2020-08-10 NOTE — Telephone Encounter (Signed)
Reaching out to patient to offer assistance regarding upcoming cardiac imaging study; pt verbalizes understanding of appt date/time, parking situation and where to check in, and verified current allergies; name and call back number provided for further questions should they arise Marchia Bond RN [facility] [phone] office [phone] cell   Pt denies implants, denies claustro Clarise Cruz

## 2020-08-10 NOTE — Telephone Encounter (Signed)
Forms placed in bin to be filled 

## 2020-08-13 ENCOUNTER — Other Ambulatory Visit (INDEPENDENT_AMBULATORY_CARE_PROVIDER_SITE_OTHER): Payer: Medicare Other

## 2020-08-13 ENCOUNTER — Encounter (HOSPITAL_COMMUNITY): Payer: Self-pay | Admitting: Internal Medicine

## 2020-08-13 ENCOUNTER — Other Ambulatory Visit: Payer: Self-pay

## 2020-08-13 ENCOUNTER — Ambulatory Visit (HOSPITAL_COMMUNITY)
Admission: RE | Admit: 2020-08-13 | Discharge: 2020-08-13 | Disposition: A | Discharge status: Home / Self Care | Payer: Medicare Other | Source: Ambulatory Visit | Attending: Internal Medicine | Admitting: Internal Medicine

## 2020-08-13 VITALS — BP 108/58 | HR 64 | Wt 144.6 lb

## 2020-08-13 DIAGNOSIS — Z880 Allergy status to penicillin: Secondary | ICD-10-CM | POA: Insufficient documentation

## 2020-08-13 DIAGNOSIS — Z87891 Personal history of nicotine dependence: Secondary | ICD-10-CM | POA: Insufficient documentation

## 2020-08-13 DIAGNOSIS — Z7901 Long term (current) use of anticoagulants: Secondary | ICD-10-CM | POA: Insufficient documentation

## 2020-08-13 DIAGNOSIS — L905 Scar conditions and fibrosis of skin: Secondary | ICD-10-CM | POA: Diagnosis not present

## 2020-08-13 DIAGNOSIS — K912 Postsurgical malabsorption, not elsewhere classified: Secondary | ICD-10-CM | POA: Insufficient documentation

## 2020-08-13 DIAGNOSIS — I350 Nonrheumatic aortic (valve) stenosis: Secondary | ICD-10-CM

## 2020-08-13 DIAGNOSIS — R531 Weakness: Secondary | ICD-10-CM | POA: Insufficient documentation

## 2020-08-13 DIAGNOSIS — R001 Bradycardia, unspecified: Secondary | ICD-10-CM | POA: Insufficient documentation

## 2020-08-13 DIAGNOSIS — Z8673 Personal history of transient ischemic attack (TIA), and cerebral infarction without residual deficits: Secondary | ICD-10-CM | POA: Insufficient documentation

## 2020-08-13 DIAGNOSIS — I5022 Chronic systolic (congestive) heart failure: Secondary | ICD-10-CM

## 2020-08-13 DIAGNOSIS — I429 Cardiomyopathy, unspecified: Secondary | ICD-10-CM | POA: Diagnosis not present

## 2020-08-13 DIAGNOSIS — Z8249 Family history of ischemic heart disease and other diseases of the circulatory system: Secondary | ICD-10-CM | POA: Diagnosis not present

## 2020-08-13 DIAGNOSIS — I2510 Atherosclerotic heart disease of native coronary artery without angina pectoris: Secondary | ICD-10-CM | POA: Insufficient documentation

## 2020-08-13 DIAGNOSIS — I447 Left bundle-branch block, unspecified: Secondary | ICD-10-CM

## 2020-08-13 DIAGNOSIS — Z79899 Other long term (current) drug therapy: Secondary | ICD-10-CM | POA: Diagnosis not present

## 2020-08-13 LAB — COMPREHENSIVE METABOLIC PANEL
ALT: 12 U/L (ref 0–35)
AST: 12 U/L (ref 0–37)
Albumin: 3.3 g/dL — ABNORMAL LOW (ref 3.5–5.2)
Alkaline Phosphatase: 79 U/L (ref 39–117)
BUN: 19 mg/dL (ref 6–23)
CO2: 21 mEq/L (ref 19–32)
Calcium: 8.2 mg/dL — ABNORMAL LOW (ref 8.4–10.5)
Chloride: 109 mEq/L (ref 96–112)
Creatinine, Ser: 1.02 mg/dL (ref 0.40–1.20)
GFR: 55.83 mL/min — ABNORMAL LOW (ref >60.00)
Glucose, Bld: 101 mg/dL — ABNORMAL HIGH (ref 70–99)
Potassium: 3.6 mEq/L (ref 3.5–5.1)
Sodium: 137 mEq/L (ref 135–145)
Total Bilirubin: 0.5 mg/dL (ref 0.2–1.2)
Total Protein: 7.3 g/dL (ref 6.0–8.3)

## 2020-08-13 LAB — ECHOCARDIOGRAM COMPLETE
AR max vel: 1.32 cm2
AV Area VTI: 1.18 cm2
AV Area mean vel: 1.2 cm2
AV Mean grad: 15 mmHg
AV Peak grad: 24.4 mmHg
Ao pk vel: 2.47 m/s
Area-P 1/2: 2.24 cm2
Calc EF: 46.8 %
Height: 68 in
S' Lateral: 3.3 cm
Single Plane A2C EF: 45.2 %
Single Plane A4C EF: 43.2 %
Weight: 2323.2 oz

## 2020-08-13 LAB — CBC
HCT: 28.3 % — ABNORMAL LOW (ref 36.0–46.0)
Hemoglobin: 9.4 g/dL — ABNORMAL LOW (ref 12.0–15.0)
MCHC: 33 g/dL (ref 30.0–36.0)
MCV: 95 fl (ref 78.0–100.0)
Platelets: 102 10*3/uL — ABNORMAL LOW (ref 150.0–400.0)
RBC: 2.98 Mil/uL — ABNORMAL LOW (ref 3.87–5.11)
RDW: 14.9 % (ref 11.5–15.5)
WBC: 5.6 10*3/uL (ref 4.0–10.5)

## 2020-08-13 LAB — C-REACTIVE PROTEIN: CRP: 1.8 mg/dL (ref 0.5–20.0)

## 2020-08-13 LAB — MAGNESIUM: Magnesium: 2 mg/dL (ref 1.5–2.5)

## 2020-08-13 LAB — VITAMIN B12: Vitamin B-12: 537 pg/mL (ref 211–911)

## 2020-08-13 LAB — PHOSPHORUS: Phosphorus: 2.7 mg/dL (ref 2.3–4.6)

## 2020-08-13 MED ORDER — GADOBUTROL 1 MMOL/ML IV SOLN
7.0000 mL | Freq: Once | INTRAVENOUS | Status: AC | PRN
  Administered 2020-08-13: 7 mL via INTRAVENOUS

## 2020-08-13 NOTE — Progress Notes (Addendum)
ADVANCED HF CLINIC NOTE  Referring Physician: Dr. Johnsie Cancel Primary Care: No primary care provider on file. Primary Cardiologist: Dr. Johnsie Cancel  HPI:  [name] [name] is a 70 y.o. 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 Johnsie Cancel for further evalaution of her progressive cardiomyopathy.   Echo 2015 found to have EF 45% with inferior wall and apical HK - she did not want to have heart cath. Myoview done at that time showed inferior/ apical scar with no ischemia She was not on beta blocker due to bradycardia and first degree block at baseline.   Carotid duplex 07/19/15 40-59% RICA stenosis repeat 01/13/20 plaque no stenosis with new lab criteria Echo 12/03/16 EF 40-45% AV sclerosis mean gradient 6 mmHg  She saw Dr. Johnsie Cancel recently in 10/21 and refused cath again. Echo performed 11/21 to reassess EF and EF 20-25% mild AS mean gradient 10. 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 Deferred any venography intervention  Cath 06/26/20: EF 35-40% Mild CAD  (pLAD 30%, mLCX 30%, dRCA 30%)ejection fraction. EF 35-40% with inferoapical aneurysm suggestive of prior infarct. No RHC attempted due to known lack of venous access  Admitted 3/22 with acute CVA/TIA with left-sided weakness. MRI of the brain,numerous small scattered acute cortical and subcortical infarct within the right cerebral hemisphere, right MCA territory as well as ACA and PCA territory. CTA of the head and neck, no large vessel occlusion. 70% atheromatous stenosis at the right carotid bulb. 50% stenosis at the left carotid bulb.  Echo 07/24/20 EF 35-40% with apical HK  no bubble done.  Here with her husband. L-sided weakness much  improved. Working with PT/OT. Remains on TPN 4 nights per week. Denies CP, SOB, orthopnea or PND. Taking lasix every day for past week. No change in weight    Past Medical History:  Diagnosis Date  . Abnormal LFTs 2013  . Allergic rhinosinusitis   . Anemia of chronic disease   . At risk for dental problems   . Atypical nevus   . Bacteremia due to Klebsiella pneumoniae 12/12/2011  . Bacterial overgrowth syndrome   . 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 Carotid stenosis    Carotid US (9/15):  R 40-59%; L 1-39% >> FU 1 year  . Closed left subtrochanteric femur fracture ([place]) 12/31/2017  . Congestive heart failure (CHF) (HCC)    EF 25-30%  . 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 Fracture of left clavicle   . History of blood transfusion 2013   anemia  . Hx of cardiovascular stress test    Myoview (9/15):  inf-apical scar; no ischemia; EF 47% - low risk   . 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 Osteoporosis   . Pancytopenia 10/07/2011  . Pathologic fracture of neck of femur ([place])   . Personal history of colonic polyps   . Renal insufficiency    hx of yrs ago  . Serratia marcescens infection - bactermia assoc w/ PICC 01/18/2015  . 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 Splenomegaly    By ultrasound  . Stroke ([place]) 07/2020  .  Thrombophilia ([place])   . Vitamin D deficiency     Current Outpatient Medications  Medication Sig Dispense Refill  . acetaminophen (TYLENOL) 500 MG tablet Take 1,000 mg by mouth daily as needed for mild pain.    . ADULT TPN Inject 1,800 mLs into the vein 4 (four) times a week. Pt receives home TPN from [facility]:  1800 mL bag, four nights weekly (Monday, Tuesday, Wednesday, Thursday for 8 hours (includes 1 hour taper up and down).    . Calcium Carb-Cholecalciferol (CALCIUM + D3 PO) Take 2 tablets by mouth daily.    . cetirizine (ZYRTEC) 10  MG tablet Take 10 mg by mouth daily as needed for allergies.    . clobetasol ointment (TEMOVATE) 8.09 % Apply 1 application topically 2 (two) times daily as needed.    . diphenhydrAMINE (BENADRYL) 25 mg capsule Take 25-50 mg by mouth every 6 (six) hours as needed for itching or sleep.     . fluticasone (FLONASE) 50 MCG/ACT nasal spray Place into both nostrils daily as needed for allergies or rhinitis.    . furosemide (LASIX) 20 MG tablet TAKE ONE TABLET BY MOUTH AS NEEDED FOR SWELLING 45 tablet 1  . Heparin Sodium, Porcine, (HEPARIN LOCK FLUSH IJ) Inject 5 mLs as directed 4 (four) times a week. Monday, Tuesday, Wednesday, Thursday in the morning with TPN    . Multiple Vitamins-Minerals (ADULT GUMMY PO) Take 2 tablets by mouth daily.    Marland Kitchen omeprazole (PRILOSEC) 40 MG capsule Take 40 mg by mouth daily.    . ondansetron (ZOFRAN-ODT) 8 MG disintegrating tablet TAKE 1 TABLET (8 MG TOTAL) BY MOUTH EVERY 8 (EIGHT) HOURS AS NEEDED FOR NAUSEA OR VOMITING. 30 tablet 11  . sacubitril-valsartan (ENTRESTO) 24-26 MG Take 1 tablet by mouth 2 (two) times daily. 60 tablet 11  . sodium chloride 0.9 % infusion Inject 900 mLs into the vein every 14 (fourteen) days. Uses for TPN    . spironolactone (ALDACTONE) 25 MG tablet Take 1 tablet (25 mg total) by mouth daily. 30 tablet 11  . Teduglutide, rDNA, 5 MG KIT Inject 3.1 application into the skin daily in the afternoon.    . vitamin E 1000 UNIT capsule Take 1,000 Units by mouth daily.    [name] Reasons 20 MG TABS tablet TAKE 1 TABLET (20 MG TOTAL) BY MOUTH DAILY WITH SUPPER. 90 tablet 2   No current facility-administered medications for this encounter.    Allergies  Allergen Reactions  . Penicillins Itching, Swelling and Other (See Comments)    Reaction:  Facial swelling Has patient had a PCN reaction causing immediate rash, facial/tongue/throat swelling, SOB or lightheadedness with hypotension: Yes Has patient had a PCN reaction causing severe rash involving mucus  membranes or skin necrosis: No Has patient had a PCN reaction that required hospitalization No Has patient had a PCN reaction occurring within the last 10 years: No If all of the above answers are "NO", then may proceed with Cephalosporin use.      Social History   Socioeconomic History  . Marital status: Married    Spouse name: Not on file  . Number of children: Not on file  . Years of education: Not on file  . Highest education level: Not on file  Occupational History  . Not on file  Tobacco Use  . Smoking status: Former Smoker    Types: Cigarettes    Quit date: 05/12/2004    Years since quitting: 16.2  . Smokeless tobacco: Never Used  Vaping Use  . Vaping Use: Never used  Substance and Sexual Activity  . Alcohol use: No  . Drug use: No  . Sexual activity: Yes    Comment: 1st intercourse 80 yo-5 partners  Other Topics Concern  . Not on file  Social History Narrative   Married to Molly Maduro, has 1 daughter and a granddaughter the patient's son died in childhood due to a motor vehicle wreck   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   No alcohol tobacco or drug use   04/07/2017   Social Determinants of Health   Financial Resource Strain: Not on file  Food Insecurity: Not on file  Transportation Needs: No Transportation Needs  . Lack of Transportation (Medical): No  . Lack of Transportation (Non-Medical): No  Physical Activity: Not on file  Stress: Not on file  Social Connections: Not on file  Intimate Partner Violence: Not on file      Family History  Problem Relation Age of Onset  . Diabetes Mother   . Hypertension Mother   . AAA (abdominal aortic aneurysm) Mother   . Colon cancer Neg Hx   . Stomach cancer Neg Hx     Vitals:   08/13/20 1115  BP: (!) 108/58  Pulse: 64  Weight: 65.6 kg   Wt Readings from Last 3 Encounters:  08/13/20 65.6 kg  07/25/20 66 kg  07/04/20 68.4 kg     PHYSICAL EXAM: General:  Well  appearing. No resp difficulty HEENT: normal Neck: supple. JVP 10  Carotids 2+ bilat; no bruits. No lymphadenopathy or thryomegaly appreciated. Cor: PMI nondisplaced. Regular rate & rhythm. 2/6 AS + tunneled cath Lungs: clear Abdomen: soft, nontender, nondistended. No hepatosplenomegaly. No bruits or masses. Good bowel sounds. Extremities: no cyanosis, clubbing, rash, 1+ ankle edema Neuro: alert & orientedx3, cranial nerves grossly intact. Mildly weak on left Affect pleasant  REDS 20%   ASSESSMENT & PLAN:  1. Cardiomyopathy  - EF 45% echo 2015 RWMA inferior wall and apex she did not want to have heart cath.  - Myoview 2015  EF 47% inferior/ apical scar with no ischemia - Echo 12/03/16 reviewed EF 40-45% AV sclerosis mean gradient 6 mmHg - Echo 11/21 EF 20-25% mild AS mean gradient 10  - Cath 06/26/20: EF 35-40% Mild CAD  (pLAD 30%, mLCX 30%, dRCA 30%)ejection fraction. EF 35-40% with inferoapical aneurysm suggestive of prior infarct (depsite lack of significant CAD). No RHC attempted due to known lack of venous access - Echo 07/24/20 EF 35-40% with apical HK  no bubble done. Mild AS Personally reviewed - NYHA II - JVP elevated but REDS normal. Possible R>L HF but confounded by venous occlusions - Continue Entresto 24/26 - Not on b-blocker in bast due to bradycardia - Continue spiro 12.5  - Consider SGLT2i at next visit.  - Add compression hose. Can take lasix as needed for swelling.  - Cath with insignificant CAD of large infero-apical aneurysm in the absence of significant epicardial CAD. Possible thrombotic episode. Await cMRI. Likely not candidate for CRT-D due to diffuse venous occlusions and also high-risk of infection with ongoing TPN.  2. Hypercoaguable state - chronically occluded RIJ - h/o mesenteric infarct  - on Xarelto. No bleeding   3. 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 Has limited venous access  4. LBBB ( ) - as above  5. Aortic  stenosis - mild by echo mean gradient 15  6. Recent CVA -  f/u with Dr. Leonie Man  - needs statin    Glori Bickers, MD  11:40 AM

## 2020-08-13 NOTE — Patient Instructions (Signed)
Please wear your compression hose daily, place them on as soon as you get up in the morning and remove before you go to bed at night.  Your physician recommends that you schedule a follow-up appointment in: 4 months  If you have any questions or concerns before your next appointment please send [place] a message through [place] or call our office at [phone].    TO LEAVE A MESSAGE FOR THE NURSE SELECT OPTION 2, PLEASE LEAVE A MESSAGE INCLUDING: . YOUR NAME . DATE OF BIRTH . CALL BACK NUMBER . REASON FOR CALL**this is important as we prioritize the call backs  [place] AS LONG AS YOU CALL BEFORE 4:00 PM  At the [facility], you and your health needs are our priority. As part of our continuing mission to provide you with exceptional heart care, we have created designated Provider Care Teams. These Care Teams include your primary Cardiologist (physician) and Advanced Practice Providers (APPs- Physician Assistants and Nurse Practitioners) who all work together to provide you with the care you need, when you need it.   You may see any of the following providers on your designated Care Team at your next follow up: Marland Kitchen Dr Glori Bickers . Dr Loralie Champagne . Dr Vickki Muff . Darrick Grinder, NP . Lyda Jester, [place] . Audry Riles, PharmD   Please be sure to bring in all your medications bottles to every appointment.

## 2020-08-13 NOTE — Progress Notes (Signed)
ReDS Vest / Clip - 08/13/20 1200      ReDS Vest / Clip   Station Marker C    Ruler Value 27    ReDS Value Range Low volume    ReDS Actual Value 20

## 2020-08-14 DIAGNOSIS — I69328 Other speech and language deficits following cerebral infarction: Secondary | ICD-10-CM | POA: Diagnosis not present

## 2020-08-14 DIAGNOSIS — D6869 Other thrombophilia: Secondary | ICD-10-CM | POA: Diagnosis not present

## 2020-08-14 DIAGNOSIS — I5042 Chronic combined systolic (congestive) and diastolic (congestive) heart failure: Secondary | ICD-10-CM | POA: Diagnosis not present

## 2020-08-14 DIAGNOSIS — R739 Hyperglycemia, unspecified: Secondary | ICD-10-CM | POA: Diagnosis not present

## 2020-08-14 DIAGNOSIS — I69354 Hemiplegia and hemiparesis following cerebral infarction affecting left non-dominant side: Secondary | ICD-10-CM | POA: Diagnosis not present

## 2020-08-14 DIAGNOSIS — K912 Postsurgical malabsorption, not elsewhere classified: Secondary | ICD-10-CM | POA: Diagnosis not present

## 2020-08-16 DIAGNOSIS — R739 Hyperglycemia, unspecified: Secondary | ICD-10-CM | POA: Diagnosis not present

## 2020-08-16 DIAGNOSIS — K912 Postsurgical malabsorption, not elsewhere classified: Secondary | ICD-10-CM | POA: Diagnosis not present

## 2020-08-16 DIAGNOSIS — D6869 Other thrombophilia: Secondary | ICD-10-CM | POA: Diagnosis not present

## 2020-08-16 DIAGNOSIS — I69328 Other speech and language deficits following cerebral infarction: Secondary | ICD-10-CM | POA: Diagnosis not present

## 2020-08-16 DIAGNOSIS — I69354 Hemiplegia and hemiparesis following cerebral infarction affecting left non-dominant side: Secondary | ICD-10-CM | POA: Diagnosis not present

## 2020-08-16 DIAGNOSIS — I5042 Chronic combined systolic (congestive) and diastolic (congestive) heart failure: Secondary | ICD-10-CM | POA: Diagnosis not present

## 2020-08-16 NOTE — Telephone Encounter (Signed)
Picked up from the back, faxed to the # provided on the form and sent to scan

## 2020-08-16 NOTE — Telephone Encounter (Signed)
Form completed and placed in basket  

## 2020-08-17 ENCOUNTER — Telehealth: Payer: Self-pay | Admitting: *Deleted

## 2020-08-17 NOTE — Chronic Care Management (AMB) (Signed)
  Chronic Care Management   Outreach Note  08/17/2020 Name: Ilynn C Furey MRN: [id number] DOB: 1950-11-15  Leeasia C Laible is a 70 y.o. year old female who is a primary care patient of [name], MD. I reached out to Rikayla C Ariel by phone today in response to a referral sent by Ms. Melessa C Theilen's PCP, [name].     A telephone outreach was attempted today patient would like for me to call her back. The patient was referred to the case management team for assistance with care management and care coordination.   Follow Up Plan: The care management team will reach out to the patient again over the next 7-14 days.  If patient returns call to provider office, please advise to call [facility] at [phone].  [facility]

## 2020-08-18 NOTE — Addendum Note (Signed)
Encounter addended by: Jolaine Artist, MD on: 08/18/2020 8:36 PM  Actions taken: Clinical Note Signed

## 2020-08-20 DIAGNOSIS — D6869 Other thrombophilia: Secondary | ICD-10-CM | POA: Diagnosis not present

## 2020-08-20 DIAGNOSIS — I69328 Other speech and language deficits following cerebral infarction: Secondary | ICD-10-CM | POA: Diagnosis not present

## 2020-08-20 DIAGNOSIS — I69354 Hemiplegia and hemiparesis following cerebral infarction affecting left non-dominant side: Secondary | ICD-10-CM | POA: Diagnosis not present

## 2020-08-20 DIAGNOSIS — R739 Hyperglycemia, unspecified: Secondary | ICD-10-CM | POA: Diagnosis not present

## 2020-08-20 DIAGNOSIS — I5042 Chronic combined systolic (congestive) and diastolic (congestive) heart failure: Secondary | ICD-10-CM | POA: Diagnosis not present

## 2020-08-20 DIAGNOSIS — K912 Postsurgical malabsorption, not elsewhere classified: Secondary | ICD-10-CM | POA: Diagnosis not present

## 2020-08-21 DIAGNOSIS — D6869 Other thrombophilia: Secondary | ICD-10-CM | POA: Diagnosis not present

## 2020-08-21 DIAGNOSIS — I69354 Hemiplegia and hemiparesis following cerebral infarction affecting left non-dominant side: Secondary | ICD-10-CM | POA: Diagnosis not present

## 2020-08-21 DIAGNOSIS — R739 Hyperglycemia, unspecified: Secondary | ICD-10-CM | POA: Diagnosis not present

## 2020-08-21 DIAGNOSIS — I69328 Other speech and language deficits following cerebral infarction: Secondary | ICD-10-CM | POA: Diagnosis not present

## 2020-08-21 DIAGNOSIS — I5042 Chronic combined systolic (congestive) and diastolic (congestive) heart failure: Secondary | ICD-10-CM | POA: Diagnosis not present

## 2020-08-21 DIAGNOSIS — K912 Postsurgical malabsorption, not elsewhere classified: Secondary | ICD-10-CM | POA: Diagnosis not present

## 2020-08-22 DIAGNOSIS — D6869 Other thrombophilia: Secondary | ICD-10-CM | POA: Diagnosis not present

## 2020-08-22 DIAGNOSIS — I69328 Other speech and language deficits following cerebral infarction: Secondary | ICD-10-CM | POA: Diagnosis not present

## 2020-08-22 DIAGNOSIS — I5042 Chronic combined systolic (congestive) and diastolic (congestive) heart failure: Secondary | ICD-10-CM | POA: Diagnosis not present

## 2020-08-22 DIAGNOSIS — K912 Postsurgical malabsorption, not elsewhere classified: Secondary | ICD-10-CM | POA: Diagnosis not present

## 2020-08-22 DIAGNOSIS — R739 Hyperglycemia, unspecified: Secondary | ICD-10-CM | POA: Diagnosis not present

## 2020-08-22 DIAGNOSIS — I69354 Hemiplegia and hemiparesis following cerebral infarction affecting left non-dominant side: Secondary | ICD-10-CM | POA: Diagnosis not present

## 2020-08-23 ENCOUNTER — Inpatient Hospital Stay: Payer: Medicare Other | Admitting: Adult Health

## 2020-08-23 DIAGNOSIS — I5042 Chronic combined systolic (congestive) and diastolic (congestive) heart failure: Secondary | ICD-10-CM | POA: Diagnosis not present

## 2020-08-23 DIAGNOSIS — R739 Hyperglycemia, unspecified: Secondary | ICD-10-CM | POA: Diagnosis not present

## 2020-08-23 DIAGNOSIS — D6869 Other thrombophilia: Secondary | ICD-10-CM | POA: Diagnosis not present

## 2020-08-23 DIAGNOSIS — I69354 Hemiplegia and hemiparesis following cerebral infarction affecting left non-dominant side: Secondary | ICD-10-CM | POA: Diagnosis not present

## 2020-08-23 DIAGNOSIS — I69328 Other speech and language deficits following cerebral infarction: Secondary | ICD-10-CM | POA: Diagnosis not present

## 2020-08-23 DIAGNOSIS — K912 Postsurgical malabsorption, not elsewhere classified: Secondary | ICD-10-CM | POA: Diagnosis not present

## 2020-08-26 DIAGNOSIS — I5042 Chronic combined systolic (congestive) and diastolic (congestive) heart failure: Secondary | ICD-10-CM | POA: Diagnosis not present

## 2020-08-26 DIAGNOSIS — Z87311 Personal history of (healed) other pathological fracture: Secondary | ICD-10-CM | POA: Diagnosis not present

## 2020-08-26 DIAGNOSIS — Z7951 Long term (current) use of inhaled steroids: Secondary | ICD-10-CM | POA: Diagnosis not present

## 2020-08-26 DIAGNOSIS — D6869 Other thrombophilia: Secondary | ICD-10-CM | POA: Diagnosis not present

## 2020-08-26 DIAGNOSIS — R739 Hyperglycemia, unspecified: Secondary | ICD-10-CM | POA: Diagnosis not present

## 2020-08-26 DIAGNOSIS — M810 Age-related osteoporosis without current pathological fracture: Secondary | ICD-10-CM | POA: Diagnosis not present

## 2020-08-26 DIAGNOSIS — N183 Chronic kidney disease, stage 3 unspecified: Secondary | ICD-10-CM | POA: Diagnosis not present

## 2020-08-26 DIAGNOSIS — K912 Postsurgical malabsorption, not elsewhere classified: Secondary | ICD-10-CM | POA: Diagnosis not present

## 2020-08-26 DIAGNOSIS — Z7901 Long term (current) use of anticoagulants: Secondary | ICD-10-CM | POA: Diagnosis not present

## 2020-08-26 DIAGNOSIS — I69354 Hemiplegia and hemiparesis following cerebral infarction affecting left non-dominant side: Secondary | ICD-10-CM | POA: Diagnosis not present

## 2020-08-26 DIAGNOSIS — Z9049 Acquired absence of other specified parts of digestive tract: Secondary | ICD-10-CM | POA: Diagnosis not present

## 2020-08-26 DIAGNOSIS — D631 Anemia in chronic kidney disease: Secondary | ICD-10-CM | POA: Diagnosis not present

## 2020-08-26 DIAGNOSIS — Z9181 History of falling: Secondary | ICD-10-CM | POA: Diagnosis not present

## 2020-08-26 DIAGNOSIS — D61818 Other pancytopenia: Secondary | ICD-10-CM | POA: Diagnosis not present

## 2020-08-26 DIAGNOSIS — E559 Vitamin D deficiency, unspecified: Secondary | ICD-10-CM | POA: Diagnosis not present

## 2020-08-26 DIAGNOSIS — I6523 Occlusion and stenosis of bilateral carotid arteries: Secondary | ICD-10-CM | POA: Diagnosis not present

## 2020-08-26 DIAGNOSIS — R161 Splenomegaly, not elsewhere classified: Secondary | ICD-10-CM | POA: Diagnosis not present

## 2020-08-26 DIAGNOSIS — I69328 Other speech and language deficits following cerebral infarction: Secondary | ICD-10-CM | POA: Diagnosis not present

## 2020-08-26 DIAGNOSIS — Z8781 Personal history of (healed) traumatic fracture: Secondary | ICD-10-CM | POA: Diagnosis not present

## 2020-08-26 DIAGNOSIS — Z87891 Personal history of nicotine dependence: Secondary | ICD-10-CM | POA: Diagnosis not present

## 2020-08-26 DIAGNOSIS — Z86010 Personal history of colonic polyps: Secondary | ICD-10-CM | POA: Diagnosis not present

## 2020-08-27 ENCOUNTER — Telehealth: Payer: Self-pay | Admitting: Family Medicine

## 2020-08-27 LAB — FATTY ACID PANEL, ESSENTIAL (C12-C22), SERUM
Arachidic, C20:0: 13.71 umol/L — ABNORMAL LOW (ref 16.8–38.5)
Arachidonic, C20:4w6: 264.34 umol/L — ABNORMAL LOW (ref 351.4–1057)
DHA, C22:6w3: 35.31 umol/L — ABNORMAL LOW (ref 53.3–216.3)
DPA, C22:5w3: 19.98 umol/L (ref 19.9–77.6)
DPA, C22:5w6: 9.15 umol/L — ABNORMAL LOW (ref 9.2–32.1)
DTA, C22:4w6: 15.03 umol/L (ref 9.1–44.6)
Docosenoic, C22:1: 4.9 umol/L — ABNORMAL LOW (ref 5.73–11.92)
EPA, C20:5w3: 10.21 umol/L — ABNORMAL LOW (ref 12.4–123)
Hexadecenoic, C16:1w9: 60.5 umol/L — ABNORMAL HIGH (ref 19.82–59.9)
LAURIC,C12:0: 12.19 umol/L (ref 4.3–36)
Linoleic, C18:2w6: 1334.34 umol/L — ABNORMAL LOW (ref 2653.4–613)
Mead, C20:3w9: 9.61 umol/L — ABNORMAL LOW (ref 10.3–41.3)
Myristic, C14:0: 105.63 umol/L (ref 39.4–258.2)
Nervonic, C24:1w9: 72.81 umol/L (ref 56.9–132.7)
Oleic, C18:1w9: 1597.05 umol/L (ref 872.4–4182)
Palmitic, C16:0: 1712.1 umol/L (ref 1671.1–460)
Palmitoleic, C16:1w7: 185.11 umol/L (ref 68.5–570.2)
Stearic, C18:0: 452.29 umol/L — ABNORMAL LOW (ref 590.2–1377)
Total Fatty Acids: 6.25 umol/L — ABNORMAL LOW (ref 7.93–18.34)
Total Monounsaturated: 2.09 umol/L (ref 1.45–4.95)
Total Polyunsaturated: 1.81 umol/L — ABNORMAL LOW (ref 3.57–8.11)
Total Saturated: 2.35 umol/L — ABNORMAL LOW (ref 2.5–6.4)
Total w3: 0.09 umol/L — ABNORMAL LOW (ref 0.1–0.5)
Total w6: 1.7 umol/L — ABNORMAL LOW (ref 3.3–7.1)
Triene Tetraene Ratio: 0.04 (ref 0.02–0.05)
Vaccenic, C18:1w7: 145.9 umol/L (ref 84.8–260.8)
a Linolenic, C18:3w3: 26.68 umol/L (ref 26.1–150.1)
g Linolenic, C18:3w6: 23.58 umol/L (ref 14.5–144.9)
h g-Linolenic, C20:3w6: 49.99 umol/L — ABNORMAL LOW (ref 70–319.9)

## 2020-08-27 LAB — PREALBUMIN: Prealbumin: 16 mg/dL — ABNORMAL LOW (ref 17–34)

## 2020-08-27 NOTE — Telephone Encounter (Signed)
..  Home Health Verbal Orders  Agency:  [facility]  [phone] Requesting OT/ PT/ Skilled nursing/ Social Work/ Speech:  Home Health OT Reason for Request:  Facilitate mobility  Frequency:  1 x a week for 3 weeks  HH needs F2F w/in last 30 days

## 2020-08-27 NOTE — Telephone Encounter (Signed)
[name] [name] from [facility] is requesting OT/PT/SW/Speech for 1x a week for 3 weeks. F2F w/in the last 30 days.

## 2020-08-28 NOTE — Chronic Care Management (AMB) (Signed)
  Chronic Care Management   Note  08/28/2020 Name: [name] [name] MRN: [id number] DOB: 07-07-1950  [name] is a 70 y.o. year old female who is a primary care patient of [name], MD. I reached out to Zyonna C Tool by phone today in response to a referral sent by [name]'s PCP, [name].     Ms. [name] was given information about Chronic Care Management services today including:  1. CCM service includes personalized support from designated clinical staff supervised by her physician, including individualized plan of care and coordination with other care providers 2. 24/7 contact phone numbers for assistance for urgent and routine care needs. 3. Service will only be billed when office clinical staff spend 20 minutes or more in a month to coordinate care. 4. Only one practitioner may furnish and bill the service in a calendar month. 5. The patient may stop CCM services at any time (effective at the end of the month) by phone call to the office staff. 6. The patient will be responsible for cost sharing (co-pay) of up to 20% of the service fee (after annual deductible is met).  Patient did not agree to enrollment in care management services with [facility] and does not wish to consider at this time.  Follow up plan: Patient declines to speak with RNCM. Appropriate care team members and provider have been notified via electronic communication. The patient has been provided with contact information for the care management team and has been advised to call with any health related questions or concerns.   [facility] Management

## 2020-08-28 NOTE — Telephone Encounter (Signed)
Ok for verbal orders ?

## 2020-08-29 ENCOUNTER — Encounter: Payer: Self-pay | Admitting: Adult Health

## 2020-08-29 ENCOUNTER — Ambulatory Visit (INDEPENDENT_AMBULATORY_CARE_PROVIDER_SITE_OTHER): Payer: Medicare Other | Admitting: Adult Health

## 2020-08-29 VITALS — BP 106/57 | HR 54 | Ht 68.0 in | Wt 143.0 lb

## 2020-08-29 DIAGNOSIS — I63413 Cerebral infarction due to embolism of bilateral middle cerebral arteries: Secondary | ICD-10-CM

## 2020-08-29 DIAGNOSIS — I671 Cerebral aneurysm, nonruptured: Secondary | ICD-10-CM

## 2020-08-29 DIAGNOSIS — Z9189 Other specified personal risk factors, not elsewhere classified: Secondary | ICD-10-CM | POA: Diagnosis not present

## 2020-08-29 DIAGNOSIS — I6523 Occlusion and stenosis of bilateral carotid arteries: Secondary | ICD-10-CM

## 2020-08-29 MED ORDER — BACLOFEN 10 MG PO TABS: 10.0000 mg | ORAL_TABLET | Freq: Two times a day (BID) | ORAL | 4 refills | Status: DC | PRN

## 2020-08-29 NOTE — Progress Notes (Signed)
[facility] [address] [place]. [place] [address] [phone]       HOSPITAL FOLLOW UP NOTE  [name] Date of Birth:  16-Sep-1950 Medical Record Number:  [id number]   Reason for Referral:  hospital stroke follow up    SUBJECTIVE:   CHIEF COMPLAINT:  Chief Complaint  Patient presents with  . Follow-up    TR with husband Herbie [place]) Pt is well, things are going pretty good. Some fatigue and leg cramps.     HPI:   [name] is a 70 y.o. female with history ofhypercoagulable state,history of ischemic gut status post resection, chronic RIJ occlusive thrombus, short gut syndrome, anemia of chronic disease, chronic diastolic CHF, carotid stenosis, atherosclerosis who presented on 07/23/2020 with complaint of slurred speech, left arm and leg numbness and weakness as well as 2 days prior began to have numbness and heavy feeling of left arm and leg.  Personally reviewed hospitalization pertinent progress notes, lab work and imaging with summary provided.  Evaluated by Dr. Leonie Man with stroke work-up revealing numerous small scattered acute cortical/subcortical infarcts within the right cerebral hemisphere, predominantly within the right MCA territory and watershed territories as well as several scattered tiny acute infarcts within the left frontal white matter within the left MCA/ACA watershed territory infarct, etiology likely cardioembolic given history of subclavian vein thrombosis.  TCD showed a very tiny clinically insignificant right to left shunt.  CTA head/neck right ICA 70% stenosis and left ICA 50% stenosis as well as 5 mm left MCA bifurcation aneurysm.  On Xarelto PTA and recommended continuation at discharge.  HTN stable.  LDL 20 and recommended initiating aspirin 80 mg daily.  Other stroke risk factors include advanced age, former tobacco use and CHF.  Evaluated by therapies and recommended [facility] PT/OT and discharged home in stable condition.  Numerous  small scattered acute cortical/subcortical infarcts within the right cerebral hemisphere, predominantly within the right MCA territoryandwatershed territories also hasseveral scattered tiny acute infarcts within the left frontal white matter within the left MCA/ACA watershed territory-infarct etiology likely cardioembolic given history of subclavian vein thrombosis strong suspicion for PFO or right-to-left shunt intracardiac (although ruled out per TCD)  CT head-No acute intracranial hemorrhage or acute demarcated cortical infarction. Patchy and ill-defined hypoattenuation within the bilateral cerebral white matter, progressed in the right centrum semiovale and corona radiata as compared to the prior head CT of 02/28/2010. Findings may reflect progressive chronic small vessel ischemic disease. However, given the provided history consider a brain MRI to exclude an acute white matter infarct at this site.  CTA head & neck-Negative CTA for large vessel occlusion. 70% atheromatous stenosis at the right carotid bulb/origin of the right ICA. 50% atheromatous stenosis at the left carotid bulb/origin of the left ICA.36mm left MCA bifurcation aneurysm.  MRI-Numerous small scattered acute cortical/subcortical infarcts within the right cerebral hemisphere, predominantly within the right MCA territory, as well as right MCA/ACA and right MCA/PCA watershed territories. Infarcts are present within the right frontal lobe, parietal lobe, temporal lobe and temporal occipital junction. Additionally, there are several scattered tiny acute infarcts within the left frontal white matter within the left MCA/ACA watershed territory. The pattern of the above described infarcts is suggestive of an embolic process and clinical correlation is recommended. Background mild cerebral atrophy and mild-to-moderate chronic small vessel ischemic disease.  2D Echo- EF: 60-65% No wall motion  abnormalities.  LDL20  HgbA1c5.4  VTE prophylaxis -SCD's  heparin IV and Xarelto (rivaroxaban) dailyprior to  admission, now on Xarelto (rivaroxaban) daily.   Therapy recommendations:Home Health PT/OT  Disposition:home  Today, 08/29/2020, [name] [name] is being seen for hospital follow-up accompanied by her husband  She has been doing well since discharge with residual mild left-sided weakness and decreased activity tolerance but reports ongoing improvement.  Currently working with [facility] PT/OT.  Ambulates without assistive device and denies any recent falls.  She has been experiencing charley horse sensations in her left hip and thigh area as well as right hand only occurring at night which will awaken her from her sleep.  Denies any other residual deficit.  Denies new stroke/TIA symptoms.  Remains on Xarelto 20 mg daily without associated side effects She questions need of aspirin as she reports this was mentioned by Dr. Leonie Man during admission but not further discussed prior to discharge She is not currently on statin as hospitalist that discharged her did not recommend statin due to low LDL Blood pressure today 106/57 which is her normal level  Continues to receive TPN 4 nights per week  Has had follow-up with cardiology 4/4 Has not yet seen PCP  No further concerns at this time    ROS:   14 system review of systems performed and negative with exception of those listed in HPI  PMH:  Past Medical History:  Diagnosis Date  . Abnormal LFTs 2013  . Allergic rhinosinusitis   . Anemia of chronic disease   . At risk for dental problems   . Atypical nevus   . Bacteremia due to Klebsiella pneumoniae 12/12/2011  . Bacterial overgrowth syndrome   . 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 Carotid stenosis    Carotid US (9/15):  R 40-59%; L 1-39% >> FU 1 year  . Closed left subtrochanteric femur fracture ([place]) 12/31/2017  . Congestive  heart failure (CHF) (HCC)    EF 25-30%  . 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 Fracture of left clavicle   . History of blood transfusion 2013   anemia  . Hx of cardiovascular stress test    Myoview (9/15):  inf-apical scar; no ischemia; EF 47% - low risk   . 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 Osteoporosis   . Pancytopenia 10/07/2011  . Pathologic fracture of neck of femur ([place])   . Personal history of colonic polyps   . Renal insufficiency    hx of yrs ago  . Serratia marcescens infection - bactermia assoc w/ PICC 01/18/2015  . 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 Splenomegaly    By ultrasound  . Stroke ([place]) 07/2020  . Thrombophilia ([place])   . Vitamin D deficiency     PSH:  Past Surgical History:  Procedure Laterality Date  . APPENDECTOMY  yrs ago  . CHOLECYSTECTOMY  yrs ago  . COLONOSCOPY  12/05/2005   internal hemorrhoids (for polyp surveillance)  . COLONOSCOPY  04/26/2012   Procedure: COLONOSCOPY;  Surgeon: Gatha Mayer, MD;  Location: [facility];  Service: Endoscopy;  Laterality: N/A;  . COLONOSCOPY WITH PROPOFOL N/A 03/18/2016   Procedure: COLONOSCOPY WITH PROPOFOL;  Surgeon: Gatha Mayer, MD;  Location: [facility];  Service: Endoscopy;  Laterality: N/A;  . ESOPHAGOGASTRODUODENOSCOPY  01/22/2009   erosive esophagitis  . HARDWARE REMOVAL Left 12/31/2017   Procedure: HARDWARE REMOVAL;  Surgeon: Rod Can, MD;  Location: [facility];  Service: Orthopedics;  Laterality: Left;  .  INTRAMEDULLARY (IM) NAIL INTERTROCHANTERIC Left 12/31/2017   Procedure: INTRAMEDULLARY (IM) NAIL SUBTROCHANTRIC;  Surgeon: Rod Can, MD;  Location: [facility];  Service: Orthopedics;  Laterality: Left;  . IR CV LINE INJECTION  03/23/2018  . IR FLUORO GUIDE CV LINE LEFT  01/01/2018  . IR FLUORO GUIDE CV LINE LEFT  03/24/2018  . IR FLUORO GUIDE CV LINE LEFT  05/21/2018  . IR FLUORO GUIDE CV LINE LEFT  07/09/2018  .  IR GENERIC HISTORICAL  07/04/2016   IR REMOVAL TUN CV CATH W/O FL 07/04/2016 Ascencion Dike, PA-C [facility]  . IR GENERIC HISTORICAL  07/10/2016   IR US GUIDE VASC ACCESS LEFT 07/10/2016 Arne Cleveland, MD [facility]  . IR GENERIC HISTORICAL  07/10/2016   IR FLUORO GUIDE CV LINE LEFT 07/10/2016 Arne Cleveland, MD [facility]  . IR PATIENT EVAL TECH 0-60 MINS  10/07/2017  . IR PATIENT EVAL TECH 0-60 MINS  03/09/2018  . IR RADIOLOGIST EVAL & MGMT  07/21/2017  . LEFT HEART CATH AND CORONARY ANGIOGRAPHY N/A 06/26/2020   Procedure: LEFT HEART CATH AND CORONARY ANGIOGRAPHY;  Surgeon: Jolaine Artist, MD;  Location: [facility];  Service: Cardiovascular;  Laterality: N/A;  . ORIF PROXIMAL FEMORAL FRACTURE W/ ITST NAIL SYSTEM  03/2007   left, Dr. Shellia Carwin  . SMALL INTESTINE SURGERY  2005   multiple with right colon resection for ischemia/infarct    Social History:  Social History   Socioeconomic History  . Marital status: Married    Spouse name: Not on file  . Number of children: Not on file  . Years of education: Not on file  . Highest education level: Not on file  Occupational History  . Not on file  Tobacco Use  . Smoking status: Former Smoker    Types: Cigarettes    Quit date: 05/12/2004    Years since quitting: 16.3  . Smokeless tobacco: Never Used  Vaping Use  . Vaping Use: Never used  Substance and Sexual Activity  . Alcohol use: No  . Drug use: No  . Sexual activity: Yes    Comment: 1st intercourse 10 yo-5 partners  Other Topics Concern  . 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 Lack of Transportation (Medical): No  . Lack of Transportation (Non-Medical): No  Physical Activity: Not on file  Stress: Not on file  Social Connections: Not on file  Intimate Partner Violence: Not on file    Family History:  Family History  Problem Relation Age of Onset  . Diabetes Mother   . Hypertension Mother   . AAA (abdominal aortic aneurysm) Mother   . Colon cancer Neg Hx   . Stomach cancer Neg Hx     Medications:   Current Outpatient Medications on File Prior to Visit  Medication Sig Dispense Refill  . acetaminophen (TYLENOL) 500 MG tablet Take 1,000 mg by mouth daily as needed for mild pain.    . ADULT TPN Inject 1,800 mLs into the vein 4 (four) times a week. Pt [facility]  Rx:  1800 mL bag, four nights weekly (Monday, Tuesday, Wednesday, Thursday for 8 hours (includes 1 hour taper up and down).    . Calcium Carb-Cholecalciferol (CALCIUM + D3 PO) Take 2 tablets by mouth daily.    . cetirizine (ZYRTEC) 10 MG tablet Take 10 mg by mouth daily as needed for allergies.    . clobetasol ointment (TEMOVATE) 6.64 % Apply 1 application topically 2 (two) times daily as needed.    . diphenhydrAMINE (BENADRYL) 25 mg capsule Take 25-50 mg by mouth every 6 (six) hours as needed for itching or sleep.     . fluticasone (FLONASE) 50 MCG/ACT nasal spray Place into both nostrils daily as needed for allergies or rhinitis.    . furosemide (LASIX) 20 MG tablet TAKE ONE TABLET BY MOUTH AS NEEDED FOR SWELLING 45 tablet 1  . Heparin Sodium, Porcine, (HEPARIN LOCK FLUSH IJ) Inject 5 mLs as directed 4 (four) times a week. Monday, Tuesday, Wednesday, Thursday in the morning with TPN    . Multiple Vitamins-Minerals (ADULT GUMMY PO) Take 2 tablets by mouth daily.    Marland Kitchen omeprazole (PRILOSEC) 40 MG capsule Take 40 mg by mouth daily.    . ondansetron (ZOFRAN-ODT) 8 MG disintegrating tablet TAKE 1 TABLET (8 MG TOTAL) BY MOUTH EVERY 8 (EIGHT) HOURS AS NEEDED FOR NAUSEA  OR VOMITING. 30 tablet 11  . sacubitril-valsartan (ENTRESTO) 24-26 MG Take 1 tablet by mouth 2 (two) times daily. 60 tablet 11  . sodium chloride 0.9 % infusion Inject 900 mLs into the vein every 14 (fourteen) days. Uses for TPN    . spironolactone (ALDACTONE) 25 MG tablet Take 1 tablet (25 mg total) by mouth daily. 30 tablet 11  . Teduglutide, rDNA, 5 MG KIT Inject 3.1 application into the skin daily in the afternoon.    . vitamin E 1000 UNIT capsule Take 1,000 Units by mouth daily.    [name] Reasons 20 MG TABS tablet TAKE 1 TABLET (20 MG TOTAL) BY MOUTH DAILY WITH SUPPER. 90 tablet 2   No current facility-administered medications on file prior to visit.    Allergies:   Allergies  Allergen Reactions  . Penicillins Itching, Swelling and Other (See Comments)    Reaction:  Facial swelling Has patient had a PCN reaction causing immediate rash, facial/tongue/throat swelling, SOB or lightheadedness with hypotension: Yes Has patient had a PCN reaction causing severe rash involving mucus membranes or skin necrosis: No Has patient had a PCN reaction that required hospitalization No Has patient had a PCN reaction occurring within the last 10 years: No If all of the above answers are "NO", then may proceed with Cephalosporin use.      OBJECTIVE:  Physical Exam  Vitals:   08/29/20 0945  BP: (!) 106/57  Pulse: (!) 54  Weight: 143 lb (64.9 kg)  Height: $Remove'5\' 8"'YdwLCcV$  (1.727 m)   Body mass index is 21.74 kg/m. No exam data present  Post stroke PHQ 2/9 Depression screen PHQ 2/9 08/29/2020  Decreased Interest 0  Down, Depressed, Hopeless 0  PHQ - 2 Score 0  Altered sleeping -  Tired, decreased energy -  Change in appetite -  Feeling bad or failure about yourself  -  Trouble concentrating -  Moving slowly or fidgety/restless -  Suicidal thoughts -  PHQ-9 Score -  Difficult doing work/chores -  Some recent data might be hidden     General: well developed, well nourished,  very pleasant  elderly Caucasian female, seated, in no evident distress  Head: head normocephalic and atraumatic.   Neck: supple with no carotid or supraclavicular bruits Cardiovascular: regular rate and rhythm, no murmurs Musculoskeletal: no deformity Skin:  no rash/petichiae Vascular:  Normal pulses all extremities   Neurologic Exam Mental Status: Awake and fully alert.   Fluent speech and language.  Oriented to place and time. Recent and remote memory intact. Attention span, concentration and fund of knowledge appropriate. Mood and affect appropriate.  Cranial Nerves: Fundoscopic exam reveals sharp disc margins. Pupils equal, briskly reactive to light. Extraocular movements full without nystagmus. Visual fields full to confrontation. Hearing intact. Facial sensation intact. Face, tongue, palate moves normally and symmetrically.  Motor: Normal bulk and tone. Normal strength in all tested extremity muscles except slightly decreased left hand dexterity and left hip flexor weakness Sensory.: intact to touch , pinprick , position and vibratory sensation.  Coordination: Rapid alternating movements normal in all extremities except slightly decreased left hand. Finger-to-nose and heel-to-shin performed accurately bilaterally.  Orbits right arm over left arm. Gait and Station: Arises from chair without difficulty. Stance is normal. Gait demonstrates normal stride length and balance with mild favoring of left leg without use of assistive device.  Reflexes: 2+ LUE and LLE; 1+ RUE and RLE. Toes downgoing.     NIHSS  0 Modified Rankin  2      ASSESSMENT: [name] [name] is a 70 y.o. year old female with recent numerous small scattered acute cortical/subcortical infarcts in the right cerebral hemisphere, predominantly within the right MCA territory watershed territories as well as several scattered tiny acute infarcts in the left frontal white matter within the left MCA and ACA watershed territory infarct, etiology  likely cardioembolic with history of subclavian vein thrombosis on 07/23/2020.  Vascular risk factors include hypercoagulable state, chronic diastolic CHF, carotid stenosis, HTN, cerebral aneurysm, and former tobacco use. Other hx of mesenteric infarction in 2005 presumably secondary to hypercoagulable state with acquired short-bowel syndrome on chronic TPN     PLAN:  1. Bilateral MCA strokes:  a. Residual deficit: Mild left spastic hemiparesis.  Encourage continued participation with home health therapies and possibly transition to outpatient therapies once completed.   b. C/o left thigh and right hand cramping -unknown etiology but present since stroke - possibly spasm type feature -recommend trialing baclofen 10 mg twice daily as needed c. Continue Xarelto (rivaroxaban) daily for secondary stroke prevention.  Will further discuss need of aspirin (per patient request) and statin with Dr. Leonie Man - recent LDL 20 but carotid stenosis present d. Discussed secondary stroke prevention measures and importance of close PCP follow up for aggressive stroke risk factor management including HTN with BP goal<130/90 2. Carotid stenosis: CTA 07/23/2020 right ICA 70% stenosis and left ICA 50% stenosis - referred to [facility] for further evaluation and possible need of intervention 3. Cerebral aneurysm: CTA 07/23/2020 5 mm left MCA bifurcation aneurysm - referred to [facility] for routine surveillance monitoring. 4. At risk for sleep apnea: per husband, snoring and witnessed apnea. Discussed risk of untreated sleep apnea especially with her known cardiac disease with CHF and recent stroke.  She declines interest in referral at this time but will call office if interested in pursuing    Follow up in 3 months or call earlier if needed   CC:  [facility] provider: Dr. Leonie Man PCP: [name], MD    I spent 45 minutes of face-to-face and non-face-to-face time with patient and husband.  This included previsit chart review  including recent hospitalization pertinent progress  notes, lab work and imaging, lab review, study review, order entry, electronic health record documentation, patient education regarding recent stroke including etiology, residual deficits, importance of managing stroke risk factors, carotid stenosis and cerebral aneurysm with further evaluation, possible underlying sleep apnea and answered all other questions to patient and husband's satisfaction  Frann Rider, AGNP-BC  [facility] [address] [place], [place] [address]  Phone [phone] Fax [phone] Note: This document was prepared with digital dictation and possible smart phrase technology. Any transcriptional errors that result from this process are unintentional.

## 2020-08-29 NOTE — Patient Instructions (Addendum)
Continue working with home health therapies for likely ongoing recovery Trial baclofen 10 mg twice daily as needed for cramping and spasticity - please let me know if you have any difficulty tolerating dosage or if dosage potentially needs to be increased  Continue Xarelto (rivaroxaban) daily  for secondary stroke prevention  Will follow up with Dr. Leonie Man regarding the need of aspirin and statin  Referral will be placed to Dr. Donzetta Matters for follow-up of your carotid arteries  Highly recommend you undergo further sleep evaluation for possible underlying sleep apnea -please call office if interested in pursuing  Continue to follow up with PCP regarding cholesterol and blood pressure management  Maintain strict control of hypertension with blood pressure goal below 130/90 and cholesterol with LDL cholesterol (bad cholesterol) goal below 70 mg/dL.       Followup in the future with me in 3 months or call earlier if needed       Thank you for coming to see [place] at [facility]. I hope we have been able to provide you high quality care today.  You may receive a patient satisfaction survey over the next few weeks. We would appreciate your feedback and comments so that we may continue to improve ourselves and the health of our patients.   Sleep Apnea Sleep apnea is a condition in which breathing pauses or becomes shallow during sleep. Episodes of sleep apnea usually last 10 seconds or longer, and they may occur as many as 20 times an hour. Sleep apnea disrupts your sleep and keeps your body from getting the rest that it needs. This condition can increase your risk of certain health problems, including:  Heart attack.  Stroke.  Obesity.  Diabetes.  Heart failure.  Irregular heartbeat. What are the causes? There are three kinds of sleep apnea:  Obstructive sleep apnea. This kind is caused by a blocked or collapsed airway.  Central sleep apnea. This kind happens when  the part of the brain that controls breathing does not send the correct signals to the muscles that control breathing.  Mixed sleep apnea. This is a combination of obstructive and central sleep apnea. The most common cause of this condition is a collapsed or blocked airway. An airway can collapse or become blocked if:  Your throat muscles are abnormally relaxed.  Your tongue and tonsils are larger than normal.  You are overweight.  Your airway is smaller than normal.   What increases the risk? You are more likely to develop this condition if you:  Are overweight.  Smoke.  Have a smaller than normal airway.  Are elderly.  Are female.  Drink alcohol.  Take sedatives or tranquilizers.  Have a family history of sleep apnea. What are the signs or symptoms? Symptoms of this condition include:  Trouble staying asleep.  Daytime sleepiness and tiredness.  Irritability.  Loud snoring.  Morning headaches.  Trouble concentrating.  Forgetfulness.  Decreased interest in sex.  Unexplained sleepiness.  Mood swings.  Personality changes.  Feelings of depression.  Waking up often during the night to urinate.  Dry mouth.  Sore throat. How is this diagnosed? This condition may be diagnosed with:  A medical history.  A physical exam.  A series of tests that are done while you are sleeping (sleep study). These tests are usually done in a sleep lab, but they may also be done at home. How is this treated? Treatment for this condition aims to restore normal breathing and to ease symptoms during  sleep. It may involve managing health issues that can affect breathing, such as high blood pressure or obesity. Treatment may include:  Sleeping on your side.  Using a decongestant if you have nasal congestion.  Avoiding the use of depressants, including alcohol, sedatives, and narcotics.  Losing weight if you are overweight.  Making changes to your diet.  Quitting  smoking.  Using a device to open your airway while you sleep, such as: ? An oral appliance. This is a custom-made mouthpiece that shifts your lower jaw forward. ? A continuous positive airway pressure (CPAP) device. This device blows air through a mask when you breathe out (exhale). ? A nasal expiratory positive airway pressure (EPAP) device. This device has valves that you put into each nostril. ? A bi-level positive airway pressure (BPAP) device. This device blows air through a mask when you breathe in (inhale) and breathe out (exhale).  Having surgery if other treatments do not work. During surgery, excess tissue is removed to create a wider airway. It is important to get treatment for sleep apnea. Without treatment, this condition can lead to:  High blood pressure.  Coronary artery disease.  In men, an inability to achieve or maintain an erection (impotence).  Reduced thinking abilities.   Follow these instructions at home: Lifestyle  Make any lifestyle changes that your health care provider recommends.  Eat a healthy, well-balanced diet.  Take steps to lose weight if you are overweight.  Avoid using depressants, including alcohol, sedatives, and narcotics.  Do not use any products that contain nicotine or tobacco, such as cigarettes, e-cigarettes, and chewing tobacco. If you need help quitting, ask your health care provider. General instructions  Take over-the-counter and prescription medicines only as told by your health care provider.  If you were given a device to open your airway while you sleep, use it only as told by your health care provider.  If you are having surgery, make sure to tell your health care provider you have sleep apnea. You may need to bring your device with you.  Keep all follow-up visits as told by your health care provider. This is important. Contact a health care provider if:  The device that you received to open your airway during sleep is  uncomfortable or does not seem to be working.  Your symptoms do not improve.  Your symptoms get worse. Get help right away if:  You develop: ? Chest pain. ? Shortness of breath. ? Discomfort in your back, arms, or stomach.  You have: ? Trouble speaking. ? Weakness on one side of your body. ? Drooping in your face. These symptoms may represent a serious problem that is an emergency. Do not wait to see if the symptoms will go away. Get medical help right away. Call your local emergency services (911 in the [place] Do not drive yourself to the hospital. Summary  Sleep apnea is a condition in which breathing pauses or becomes shallow during sleep.  The most common cause is a collapsed or blocked airway.  The goal of treatment is to restore normal breathing and to ease symptoms during sleep. This information is not intended to replace advice given to you by your health care provider. Make sure you discuss any questions you have with your health care provider. Document Revised: 10/13/2018 Document Reviewed: 12/22/2017 Elsevier Patient Education  2021 [employer].

## 2020-08-29 NOTE — Telephone Encounter (Signed)
Called and left verbal order for patient per DrTabori on vm with [place].

## 2020-08-30 ENCOUNTER — Telehealth: Payer: Self-pay | Admitting: Family Medicine

## 2020-08-30 DIAGNOSIS — I69328 Other speech and language deficits following cerebral infarction: Secondary | ICD-10-CM | POA: Diagnosis not present

## 2020-08-30 DIAGNOSIS — I69354 Hemiplegia and hemiparesis following cerebral infarction affecting left non-dominant side: Secondary | ICD-10-CM | POA: Diagnosis not present

## 2020-08-30 DIAGNOSIS — D6869 Other thrombophilia: Secondary | ICD-10-CM | POA: Diagnosis not present

## 2020-08-30 DIAGNOSIS — K912 Postsurgical malabsorption, not elsewhere classified: Secondary | ICD-10-CM | POA: Diagnosis not present

## 2020-08-30 DIAGNOSIS — R739 Hyperglycemia, unspecified: Secondary | ICD-10-CM | POA: Diagnosis not present

## 2020-08-30 DIAGNOSIS — I5042 Chronic combined systolic (congestive) and diastolic (congestive) heart failure: Secondary | ICD-10-CM | POA: Diagnosis not present

## 2020-08-30 NOTE — Telephone Encounter (Signed)
Spoke with patient and she stated she had a stroke and want to schedule with Dr Birdie Riddle first before doing her AWV.

## 2020-08-31 ENCOUNTER — Telehealth: Payer: Self-pay | Admitting: Family Medicine

## 2020-08-31 NOTE — Telephone Encounter (Signed)
..  Type of form received:OT Re-evaluation  Additional comments:   Received by:  [name] [name] should be Faxed to:  [phone] Form should be mailed to:    Is patient requesting call for pickup:   Form placed:  In Dr. Virgil Benedict bin up front  Attach charge sheet.  Provider will determine charge.  Individual made aware of 3-5 business day turn around (Y/N)?

## 2020-09-03 NOTE — Progress Notes (Signed)
I agree there is no need of additional aspirin to Xarelto unless patient has symptomatic coronary artery disease or a stent

## 2020-09-03 NOTE — Progress Notes (Signed)
I agree with the above plan 

## 2020-09-04 ENCOUNTER — Encounter: Payer: Self-pay | Admitting: Adult Health

## 2020-09-04 DIAGNOSIS — D6869 Other thrombophilia: Secondary | ICD-10-CM | POA: Diagnosis not present

## 2020-09-04 DIAGNOSIS — K912 Postsurgical malabsorption, not elsewhere classified: Secondary | ICD-10-CM | POA: Diagnosis not present

## 2020-09-04 DIAGNOSIS — R739 Hyperglycemia, unspecified: Secondary | ICD-10-CM | POA: Diagnosis not present

## 2020-09-04 DIAGNOSIS — I5042 Chronic combined systolic (congestive) and diastolic (congestive) heart failure: Secondary | ICD-10-CM | POA: Diagnosis not present

## 2020-09-04 DIAGNOSIS — I69354 Hemiplegia and hemiparesis following cerebral infarction affecting left non-dominant side: Secondary | ICD-10-CM | POA: Diagnosis not present

## 2020-09-04 DIAGNOSIS — I69328 Other speech and language deficits following cerebral infarction: Secondary | ICD-10-CM | POA: Diagnosis not present

## 2020-09-05 DIAGNOSIS — D6869 Other thrombophilia: Secondary | ICD-10-CM | POA: Diagnosis not present

## 2020-09-05 DIAGNOSIS — R739 Hyperglycemia, unspecified: Secondary | ICD-10-CM | POA: Diagnosis not present

## 2020-09-05 DIAGNOSIS — I69328 Other speech and language deficits following cerebral infarction: Secondary | ICD-10-CM | POA: Diagnosis not present

## 2020-09-05 DIAGNOSIS — I69354 Hemiplegia and hemiparesis following cerebral infarction affecting left non-dominant side: Secondary | ICD-10-CM | POA: Diagnosis not present

## 2020-09-05 DIAGNOSIS — I5042 Chronic combined systolic (congestive) and diastolic (congestive) heart failure: Secondary | ICD-10-CM | POA: Diagnosis not present

## 2020-09-05 DIAGNOSIS — K912 Postsurgical malabsorption, not elsewhere classified: Secondary | ICD-10-CM | POA: Diagnosis not present

## 2020-09-06 ENCOUNTER — Other Ambulatory Visit: Payer: Self-pay | Admitting: Cardiovascular Disease

## 2020-09-06 DIAGNOSIS — I5022 Chronic systolic (congestive) heart failure: Secondary | ICD-10-CM

## 2020-09-11 DIAGNOSIS — R739 Hyperglycemia, unspecified: Secondary | ICD-10-CM | POA: Diagnosis not present

## 2020-09-11 DIAGNOSIS — I5042 Chronic combined systolic (congestive) and diastolic (congestive) heart failure: Secondary | ICD-10-CM | POA: Diagnosis not present

## 2020-09-11 DIAGNOSIS — I69328 Other speech and language deficits following cerebral infarction: Secondary | ICD-10-CM | POA: Diagnosis not present

## 2020-09-11 DIAGNOSIS — D6869 Other thrombophilia: Secondary | ICD-10-CM | POA: Diagnosis not present

## 2020-09-11 DIAGNOSIS — I69354 Hemiplegia and hemiparesis following cerebral infarction affecting left non-dominant side: Secondary | ICD-10-CM | POA: Diagnosis not present

## 2020-09-11 DIAGNOSIS — K912 Postsurgical malabsorption, not elsewhere classified: Secondary | ICD-10-CM | POA: Diagnosis not present

## 2020-09-12 DIAGNOSIS — I69328 Other speech and language deficits following cerebral infarction: Secondary | ICD-10-CM | POA: Diagnosis not present

## 2020-09-12 DIAGNOSIS — K912 Postsurgical malabsorption, not elsewhere classified: Secondary | ICD-10-CM | POA: Diagnosis not present

## 2020-09-12 DIAGNOSIS — I5042 Chronic combined systolic (congestive) and diastolic (congestive) heart failure: Secondary | ICD-10-CM | POA: Diagnosis not present

## 2020-09-12 DIAGNOSIS — I69354 Hemiplegia and hemiparesis following cerebral infarction affecting left non-dominant side: Secondary | ICD-10-CM | POA: Diagnosis not present

## 2020-09-12 DIAGNOSIS — R739 Hyperglycemia, unspecified: Secondary | ICD-10-CM | POA: Diagnosis not present

## 2020-09-12 DIAGNOSIS — D6869 Other thrombophilia: Secondary | ICD-10-CM | POA: Diagnosis not present

## 2020-09-17 ENCOUNTER — Other Ambulatory Visit (INDEPENDENT_AMBULATORY_CARE_PROVIDER_SITE_OTHER): Payer: Medicare Other

## 2020-09-17 DIAGNOSIS — K912 Postsurgical malabsorption, not elsewhere classified: Secondary | ICD-10-CM | POA: Diagnosis not present

## 2020-09-17 LAB — COMPREHENSIVE METABOLIC PANEL
ALT: 14 U/L (ref 0–35)
AST: 13 U/L (ref 0–37)
Albumin: 3.2 g/dL — ABNORMAL LOW (ref 3.5–5.2)
Alkaline Phosphatase: 102 U/L (ref 39–117)
BUN: 27 mg/dL — ABNORMAL HIGH (ref 6–23)
CO2: 17 mEq/L — ABNORMAL LOW (ref 19–32)
Calcium: 8.3 mg/dL — ABNORMAL LOW (ref 8.4–10.5)
Chloride: 113 mEq/L — ABNORMAL HIGH (ref 96–112)
Creatinine, Ser: 1.21 mg/dL — ABNORMAL HIGH (ref 0.40–1.20)
GFR: 45.45 mL/min — ABNORMAL LOW (ref >60.00)
Glucose, Bld: 85 mg/dL (ref 70–99)
Potassium: 3.7 mEq/L (ref 3.5–5.1)
Sodium: 138 mEq/L (ref 135–145)
Total Bilirubin: 0.5 mg/dL (ref 0.2–1.2)
Total Protein: 7.2 g/dL (ref 6.0–8.3)

## 2020-09-17 LAB — CBC
HCT: 29.5 % — ABNORMAL LOW (ref 36.0–46.0)
Hemoglobin: 9.7 g/dL — ABNORMAL LOW (ref 12.0–15.0)
MCHC: 32.8 g/dL (ref 30.0–36.0)
MCV: 96.1 fl (ref 78.0–100.0)
Platelets: 92 10*3/uL — ABNORMAL LOW (ref 150.0–400.0)
RBC: 3.07 Mil/uL — ABNORMAL LOW (ref 3.87–5.11)
RDW: 15 % (ref 11.5–15.5)
WBC: 6.1 10*3/uL (ref 4.0–10.5)

## 2020-09-17 LAB — VITAMIN B12: Vitamin B-12: 544 pg/mL (ref 211–911)

## 2020-09-17 LAB — PHOSPHORUS: Phosphorus: 3.6 mg/dL (ref 2.3–4.6)

## 2020-09-17 LAB — MAGNESIUM: Magnesium: 1.9 mg/dL (ref 1.5–2.5)

## 2020-09-17 LAB — C-REACTIVE PROTEIN: CRP: 1.8 mg/dL (ref 0.5–20.0)

## 2020-09-18 DIAGNOSIS — I5042 Chronic combined systolic (congestive) and diastolic (congestive) heart failure: Secondary | ICD-10-CM | POA: Diagnosis not present

## 2020-09-18 DIAGNOSIS — D6869 Other thrombophilia: Secondary | ICD-10-CM | POA: Diagnosis not present

## 2020-09-18 DIAGNOSIS — K912 Postsurgical malabsorption, not elsewhere classified: Secondary | ICD-10-CM | POA: Diagnosis not present

## 2020-09-18 DIAGNOSIS — R739 Hyperglycemia, unspecified: Secondary | ICD-10-CM | POA: Diagnosis not present

## 2020-09-18 DIAGNOSIS — I69328 Other speech and language deficits following cerebral infarction: Secondary | ICD-10-CM | POA: Diagnosis not present

## 2020-09-18 DIAGNOSIS — I69354 Hemiplegia and hemiparesis following cerebral infarction affecting left non-dominant side: Secondary | ICD-10-CM | POA: Diagnosis not present

## 2020-09-19 DIAGNOSIS — K912 Postsurgical malabsorption, not elsewhere classified: Secondary | ICD-10-CM | POA: Diagnosis not present

## 2020-09-19 DIAGNOSIS — I69328 Other speech and language deficits following cerebral infarction: Secondary | ICD-10-CM | POA: Diagnosis not present

## 2020-09-19 DIAGNOSIS — I69354 Hemiplegia and hemiparesis following cerebral infarction affecting left non-dominant side: Secondary | ICD-10-CM | POA: Diagnosis not present

## 2020-09-19 DIAGNOSIS — R739 Hyperglycemia, unspecified: Secondary | ICD-10-CM | POA: Diagnosis not present

## 2020-09-19 DIAGNOSIS — I5042 Chronic combined systolic (congestive) and diastolic (congestive) heart failure: Secondary | ICD-10-CM | POA: Diagnosis not present

## 2020-09-19 DIAGNOSIS — D6869 Other thrombophilia: Secondary | ICD-10-CM | POA: Diagnosis not present

## 2020-09-20 ENCOUNTER — Telehealth: Payer: Self-pay

## 2020-09-20 NOTE — Telephone Encounter (Signed)
Form placed in bin to be filled. 

## 2020-09-20 NOTE — Telephone Encounter (Signed)
Betsy from [place] walked in and gave [place] a form on behalf if the patient, [name] [name] is no longer homebound and needs to continue with therapy. Betsy dropped off an order, have placed the paper in folder up front.

## 2020-09-21 ENCOUNTER — Ambulatory Visit (INDEPENDENT_AMBULATORY_CARE_PROVIDER_SITE_OTHER): Payer: Medicare Other | Admitting: Internal Medicine

## 2020-09-21 ENCOUNTER — Other Ambulatory Visit: Payer: Self-pay

## 2020-09-21 ENCOUNTER — Encounter: Payer: Self-pay | Admitting: Internal Medicine

## 2020-09-21 VITALS — BP 92/52 | HR 52 | Ht 68.0 in | Wt 143.4 lb

## 2020-09-21 DIAGNOSIS — D696 Thrombocytopenia, unspecified: Secondary | ICD-10-CM | POA: Diagnosis not present

## 2020-09-21 DIAGNOSIS — K912 Postsurgical malabsorption, not elsewhere classified: Secondary | ICD-10-CM

## 2020-09-21 DIAGNOSIS — K6389 Other specified diseases of intestine: Secondary | ICD-10-CM

## 2020-09-21 DIAGNOSIS — K760 Fatty (change of) liver, not elsewhere classified: Secondary | ICD-10-CM

## 2020-09-21 DIAGNOSIS — I255 Ischemic cardiomyopathy: Secondary | ICD-10-CM

## 2020-09-21 DIAGNOSIS — Z8719 Personal history of other diseases of the digestive system: Secondary | ICD-10-CM | POA: Diagnosis not present

## 2020-09-21 DIAGNOSIS — R161 Splenomegaly, not elsewhere classified: Secondary | ICD-10-CM

## 2020-09-21 NOTE — Progress Notes (Signed)
[name] [name] 70 y.o. Jul 22, 1950 [id number]  Assessment & Plan:   Encounter Diagnoses  Name Primary?  Marland Kitchen Acquired short bowel syndrome Yes  . Bacterial overgrowth syndrome   . H/O mesenteric infarction   . Thrombocytopenia (HCC)   . NAFLD (nonalcoholic fatty liver disease)   . Splenomegaly     Acquired short bowel syndrome She remains on TPN 4 out of 7 nights and this is being adjusted to try to reduce cramps.  I do think she may be somewhat dehydrated.  She does take supplemental IV fluids as well plus is on diuretics for heart failure.  She sees Dr. Gala Romney in August.  I will copy him and ask if we need to adjust her diuretics further.  She does have some trace peripheral edema today certainly protein stores could affect that as well.  The level of BUN elevation and creatinine elevation is not very concerning to me but I think getting cardiology input would be helpful.  Question if oral rehydration solution would work in her  I will image her abdomen again given splenomegaly and suspected fatty liver disease.  Ultrasound, complete.  She has had thrombocytopenia but not evidence of cirrhosis.  Bacterial overgrowth syndrome Stable.  Cyclical antibiotics.  NAFLD (nonalcoholic fatty liver disease) Ultrasound complete abdomen scheduled   I appreciate the opportunity to care for this patient. CC: [name], MD Dr. Jesusita Oka Bensimhon Lissa Merlin RD, CNSC [facility]  Subjective:   Chief Complaint: Follow-up of short-bowel syndrome HPI [name] [name] is here with her husband.  She had a stroke in March she has no residual deficits.  Continues on treatment for heart failure with diuretics and Entresto.  Followed by Dr. Gala Romney.  She has been having some cramping in her hands and her pharmacist that runs her TPN is going to alter her schedule so that she does not go 3 consecutive nights without TPN to see if that helps.  Her BUN and creatinine are up a bit.   Otherwise things are stable.  She has not had imaging of the liver since 2020.   Wt Readings from Last 3 Encounters:  09/21/20 143 lb 6 oz (65 kg)  08/29/20 143 lb (64.9 kg)  08/13/20 144 lb 9.6 oz (65.6 kg)   6/21 141#  She is here that her granddaughter is working as an Tax inspector and a Neurosurgeon has imprinted upon her.   Lab Results  Component Value Date   CREATININE 1.21 (H) 09/17/2020   BUN 27 (H) 09/17/2020   NA 138 09/17/2020   K 3.7 09/17/2020   CL 113 (H) 09/17/2020   CO2 17 (L) 09/17/2020    Allergies  Allergen Reactions  . Penicillins Itching, Swelling and Other (See Comments)    Reaction:  Facial swelling Has patient had a PCN reaction causing immediate rash, facial/tongue/throat swelling, SOB or lightheadedness with hypotension: Yes Has patient had a PCN reaction causing severe rash involving mucus membranes or skin necrosis: No Has patient had a PCN reaction that required hospitalization No Has patient had a PCN reaction occurring within the last 10 years: No If all of the above answers are "NO", then may proceed with Cephalosporin use.   Current Meds  Medication Sig  . acetaminophen (TYLENOL) 500 MG tablet Take 1,000 mg by mouth daily as needed for mild pain.  . ADULT TPN Inject 1,800 mLs into the vein 4 (four) times a week. Pt receives home TPN from [facility]:  1800 mL bag,  four nights weekly (Monday, Tuesday, Wednesday, Thursday for 8 hours (includes 1 hour taper up and down).  . baclofen (LIORESAL) 10 MG tablet Take 1 tablet (10 mg total) by mouth 2 (two) times daily as needed for muscle spasms.  . Calcium Carb-Cholecalciferol (CALCIUM + D3 PO) Take 2 tablets by mouth daily.  . cetirizine (ZYRTEC) 10 MG tablet Take 10 mg by mouth daily as needed for allergies.  . clobetasol ointment (TEMOVATE) 0.31 % Apply 1 application topically 2 (two) times daily as needed.  . diphenhydrAMINE (BENADRYL) 25 mg capsule Take 25-50 mg by mouth every 6 (six) hours as needed  for itching or sleep.   . fluticasone (FLONASE) 50 MCG/ACT nasal spray Place into both nostrils daily as needed for allergies or rhinitis.  . furosemide (LASIX) 20 MG tablet TAKE ONE TABLET BY MOUTH AS NEEDED FOR SWELLING  . Heparin Sodium, Porcine, (HEPARIN LOCK FLUSH IJ) Inject 5 mLs as directed 4 (four) times a week. Monday, Tuesday, Wednesday, Thursday in the morning with TPN  . Multiple Vitamins-Minerals (ADULT GUMMY PO) Take 2 tablets by mouth daily.  Marland Kitchen omeprazole (PRILOSEC) 40 MG capsule Take 40 mg by mouth daily.  . ondansetron (ZOFRAN-ODT) 8 MG disintegrating tablet TAKE 1 TABLET (8 MG TOTAL) BY MOUTH EVERY 8 (EIGHT) HOURS AS NEEDED FOR NAUSEA OR VOMITING.  . sacubitril-valsartan (ENTRESTO) 24-26 MG Take 1 tablet by mouth 2 (two) times daily.  . sodium chloride 0.9 % infusion Inject 900 mLs into the vein every 14 (fourteen) days. Uses for TPN  . spironolactone (ALDACTONE) 25 MG tablet Take 1 tablet (25 mg total) by mouth daily.  . Teduglutide, rDNA, 5 MG KIT Inject 3.1 application into the skin daily in the afternoon.  . vitamin E 1000 UNIT capsule Take 1,000 Units by mouth daily.  [name] Reasons 20 MG TABS tablet TAKE 1 TABLET (20 MG TOTAL) BY MOUTH DAILY WITH SUPPER.   Past Medical History:  Diagnosis Date  . Abnormal LFTs 2013  . Allergic rhinosinusitis   . Anemia of chronic disease   . At risk for dental problems   . Atypical nevus   . Bacteremia due to Klebsiella pneumoniae 12/12/2011  . Bacterial overgrowth syndrome   . 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 Carotid stenosis    Carotid US (9/15):  R 40-59%; L 1-39% >> FU 1 year  . Closed left subtrochanteric femur fracture ([place]) 12/31/2017  . Congestive heart failure (CHF) (HCC)    EF 25-30%  . 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 Fracture of left clavicle   . History of blood transfusion 2013   anemia  . Hx of cardiovascular stress test    Myoview  (9/15):  inf-apical scar; no ischemia; EF 47% - low risk   . 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 Osteoporosis   . Pancytopenia 10/07/2011  . Pathologic fracture of neck of femur ([place])   . Personal history of colonic polyps   . Renal insufficiency    hx of yrs ago  . Serratia marcescens infection - bactermia assoc w/ PICC 01/18/2015  . 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[place])   . Splenomegaly    By ultrasound  . Stroke ([place]) 07/2020  . Thrombophilia ([place])   . Vitamin D deficiency    Past Surgical History:  Procedure Laterality Date  . APPENDECTOMY  yrs ago  . CHOLECYSTECTOMY  yrs ago  . COLONOSCOPY  12/05/2005   internal hemorrhoids (for polyp surveillance)  . COLONOSCOPY  04/26/2012   Procedure: COLONOSCOPY;  Surgeon: Gatha Mayer, MD;  Location: [facility];  Service: Endoscopy;  Laterality: N/A;  . COLONOSCOPY WITH PROPOFOL N/A 03/18/2016   Procedure: COLONOSCOPY WITH PROPOFOL;  Surgeon: Gatha Mayer, MD;  Location: [facility];  Service: Endoscopy;  Laterality: N/A;  . ESOPHAGOGASTRODUODENOSCOPY  01/22/2009   erosive esophagitis  . HARDWARE REMOVAL Left 12/31/2017   Procedure: HARDWARE REMOVAL;  Surgeon: Rod Can, MD;  Location: [facility];  Service: Orthopedics;  Laterality: Left;  . INTRAMEDULLARY (IM) NAIL INTERTROCHANTERIC Left 12/31/2017   Procedure: INTRAMEDULLARY (IM) NAIL SUBTROCHANTRIC;  Surgeon: Rod Can, MD;  Location: [facility];  Service: Orthopedics;  Laterality: Left;  . IR CV LINE INJECTION  03/23/2018  . IR FLUORO GUIDE CV LINE LEFT  01/01/2018  . IR FLUORO GUIDE CV LINE LEFT  03/24/2018  . IR FLUORO GUIDE CV LINE LEFT  05/21/2018  . IR FLUORO GUIDE CV LINE LEFT  07/09/2018  . IR GENERIC HISTORICAL  07/04/2016   IR REMOVAL TUN CV CATH W/O FL 07/04/2016 Ascencion Dike, PA-C [facility]  . IR GENERIC HISTORICAL  07/10/2016   IR US GUIDE VASC ACCESS LEFT 07/10/2016 Arne Cleveland, MD [facility]  . IR GENERIC  HISTORICAL  07/10/2016   IR FLUORO GUIDE CV LINE LEFT 07/10/2016 Arne Cleveland, MD [facility]  . IR PATIENT EVAL TECH 0-60 MINS  10/07/2017  . IR PATIENT EVAL TECH 0-60 MINS  03/09/2018  . IR RADIOLOGIST EVAL & MGMT  07/21/2017  . LEFT HEART CATH AND CORONARY ANGIOGRAPHY N/A 06/26/2020   Procedure: LEFT HEART CATH AND CORONARY ANGIOGRAPHY;  Surgeon: Jolaine Artist, MD;  Location: [place];  Service: Cardiovascular;  Laterality: N/A;  . ORIF PROXIMAL FEMORAL FRACTURE W/ ITST NAIL SYSTEM  03/2007   left, Dr. Shellia Carwin  . 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   Review of Systems As per HPI  Objective:   Physical Exam BP (!) 92/52 (BP Location: Left Arm, Patient Position: Sitting, Cuff Size: Normal)   Pulse (!) 52   Ht $R'5\' 8"'mA$  (1.727 m)   Wt 143 lb 6 oz (65 kg)   LMP 02/26/2014   BMI 21.80 kg/m  NAD Lungs cta Cor NL s1s2 no rmg abd soft NT no clear HSM soft scar in RLQ as before Ext tr edema

## 2020-09-21 NOTE — Patient Instructions (Signed)
You have been scheduled for an abdominal ultrasound at [facility] on 09/26/20 at 11:00am. Please arrive 15 minutes prior to your appointment for registration. Make certain not to have anything to eat or drink 6 hours prior to your appointment. Should you need to reschedule your appointment, please contact radiology at [phone]. This test typically takes about 30 minutes to perform.   I appreciate the opportunity to care for you. Silvano Rusk, MD, [facility]

## 2020-09-21 NOTE — Assessment & Plan Note (Signed)
Ultrasound complete abdomen scheduled

## 2020-09-21 NOTE — Assessment & Plan Note (Addendum)
She remains on TPN 4 out of 7 nights and this is being adjusted to try to reduce cramps.  I do think she may be somewhat dehydrated.  She does take supplemental IV fluids as well plus is on diuretics for heart failure.  She sees Dr. Haroldine Laws in August.  I will copy him and ask if we need to adjust her diuretics further.  She does have some trace peripheral edema today certainly protein stores could affect that as well.  The level of BUN elevation and creatinine elevation is not very concerning to me but I think getting cardiology input would be helpful.  Question if oral rehydration solution would work in her  I will image her abdomen again given splenomegaly and suspected fatty liver disease.  Ultrasound, complete.  She has had thrombocytopenia but not evidence of cirrhosis.

## 2020-09-21 NOTE — Assessment & Plan Note (Signed)
Stable.  Cyclical antibiotics.

## 2020-09-21 NOTE — Telephone Encounter (Signed)
Form completed and placed in basket  

## 2020-09-21 NOTE — Telephone Encounter (Signed)
Forms picked up and faxed to [phone]

## 2020-09-25 ENCOUNTER — Ambulatory Visit (INDEPENDENT_AMBULATORY_CARE_PROVIDER_SITE_OTHER): Payer: Medicare Other | Admitting: Vascular Surgery

## 2020-09-25 ENCOUNTER — Encounter: Payer: Self-pay | Admitting: Vascular Surgery

## 2020-09-25 ENCOUNTER — Other Ambulatory Visit: Payer: Self-pay

## 2020-09-25 VITALS — BP 70/46 | HR 59 | Temp 97.9°F | Resp 16 | Ht 68.0 in | Wt 141.8 lb

## 2020-09-25 DIAGNOSIS — I6523 Occlusion and stenosis of bilateral carotid arteries: Secondary | ICD-10-CM | POA: Diagnosis not present

## 2020-09-25 DIAGNOSIS — I871 Compression of vein: Secondary | ICD-10-CM | POA: Diagnosis not present

## 2020-09-25 NOTE — Progress Notes (Signed)
Patient ID: [name] [name], female   DOB: 20-Aug-1950, 70 y.o.   MRN: [id number]  Reason for Consult: New Patient (Initial Visit)   Referred by [name], MD  Subjective:     HPI:  [name] [name] is a 70 y.o. female recently admitted with bilateral strokes thought to be cardioembolic in nature.  She was found to bilateral carotid artery stenosis.  She has been followed for this in the past.  She has also been followed for right upper extremity swelling with a history of indwelling catheter.  Right upper extremity swelling much improved.  She has some residual balance issues since her strokes but no frank deficits.  She is on Xarelto.  Past Medical History:  Diagnosis Date  . Abnormal LFTs 2013  . Allergic rhinosinusitis   . Anemia of chronic disease   . At risk for dental problems   . Atypical nevus   . Bacteremia due to Klebsiella pneumoniae 12/12/2011  . Bacterial overgrowth syndrome   . 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 Carotid stenosis    Carotid US (9/15):  R 40-59%; L 1-39% >> FU 1 year  . Closed left subtrochanteric femur fracture (HCC) 12/31/2017  . Congestive heart failure (CHF) (HCC)    EF 25-30%  . 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 Fracture of left clavicle   . History of blood transfusion 2013   anemia  . Hx of cardiovascular stress test    Myoview (9/15):  inf-apical scar; no ischemia; EF 47% - low risk   . 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 Osteoporosis   . Pancytopenia 10/07/2011  . Pathologic fracture of neck of femur (HCC)   . Personal history of colonic polyps   . Renal insufficiency    hx of yrs ago  . Serratia marcescens infection - bactermia assoc w/ PICC 01/18/2015  . 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HCC)   . Splenomegaly    By ultrasound  . Stroke (HCC) 07/2020  . Thrombophilia (HCC)   . Vitamin D deficiency     Family History  Problem Relation Age of Onset  . Diabetes Mother   . Hypertension Mother   . AAA (abdominal aortic aneurysm) Mother   . Colon cancer Neg Hx   . Stomach cancer Neg Hx    Past Surgical History:  Procedure Laterality Date  . APPENDECTOMY  yrs ago  . CHOLECYSTECTOMY  yrs ago  . COLONOSCOPY  12/05/2005   internal hemorrhoids (for polyp surveillance)  . COLONOSCOPY  04/26/2012   Procedure: COLONOSCOPY;  Surgeon: Iva Boop, MD;  Location: [facility];  Service: Endoscopy;  Laterality: N/A;  . COLONOSCOPY WITH PROPOFOL N/A 03/18/2016   Procedure: COLONOSCOPY WITH PROPOFOL;  Surgeon: Iva Boop, MD;  Location: [facility];  Service: Endoscopy;  Laterality: N/A;  . ESOPHAGOGASTRODUODENOSCOPY  01/22/2009   erosive esophagitis  . HARDWARE REMOVAL Left 12/31/2017   Procedure: HARDWARE REMOVAL;  Surgeon: Samson Frederic, MD;  Location: [facility];  Service: Orthopedics;  Laterality: Left;  . INTRAMEDULLARY (IM) NAIL INTERTROCHANTERIC Left 12/31/2017   Procedure: INTRAMEDULLARY (IM) NAIL SUBTROCHANTRIC;  Surgeon: Samson Frederic, MD;  Location: [facility];  Service: Orthopedics;  Laterality: Left;  . IR CV LINE INJECTION  03/23/2018  . IR FLUORO GUIDE CV LINE LEFT  01/01/2018  . IR FLUORO GUIDE CV LINE LEFT  03/24/2018  .  IR FLUORO GUIDE CV LINE LEFT  05/21/2018  . IR FLUORO GUIDE CV LINE LEFT  07/09/2018  . IR GENERIC HISTORICAL  07/04/2016   IR REMOVAL TUN CV CATH W/O FL 07/04/2016 Ascencion Dike, PA-C [facility]  . IR GENERIC HISTORICAL  07/10/2016   IR US GUIDE VASC ACCESS LEFT 07/10/2016 Arne Cleveland, MD [facility]  . IR GENERIC HISTORICAL  07/10/2016   IR FLUORO GUIDE CV LINE LEFT 07/10/2016 Arne Cleveland, MD [facility]  . IR PATIENT EVAL TECH 0-60 MINS  10/07/2017  . IR PATIENT EVAL TECH 0-60 MINS  03/09/2018  . IR RADIOLOGIST EVAL & MGMT  07/21/2017  . LEFT HEART CATH AND CORONARY ANGIOGRAPHY N/A 06/26/2020   Procedure: LEFT HEART CATH AND CORONARY ANGIOGRAPHY;  Surgeon:  Jolaine Artist, MD;  Location: [facility];  Service: Cardiovascular;  Laterality: N/A;  . ORIF PROXIMAL FEMORAL FRACTURE W/ ITST NAIL SYSTEM  03/2007   left, Dr. Shellia Carwin  . SMALL INTESTINE SURGERY  2005   multiple with right colon resection for ischemia/infarct    Short Social History:  Social History   Tobacco Use  . Smoking status: Former Smoker    Types: Cigarettes    Quit date: 05/12/2004    Years since quitting: 16.3  . Smokeless tobacco: Never Used  Substance Use Topics  . Alcohol use: No    Allergies  Allergen Reactions  . Penicillins Itching, Swelling and Other (See Comments)    Reaction:  Facial swelling Has patient had a PCN reaction causing immediate rash, facial/tongue/throat swelling, SOB or lightheadedness with hypotension: Yes Has patient had a PCN reaction causing severe rash involving mucus membranes or skin necrosis: No Has patient had a PCN reaction that required hospitalization No Has patient had a PCN reaction occurring within the last 10 years: No If all of the above answers are "NO", then may proceed with Cephalosporin use.    Current Outpatient Medications  Medication Sig Dispense Refill  . acetaminophen (TYLENOL) 500 MG tablet Take 1,000 mg by mouth daily as needed for mild pain.    . ADULT TPN Inject 1,800 mLs into the vein 4 (four) times a week. Pt receives home TPN from [facility]:  1800 mL bag, four nights weekly (Monday, Tuesday, Wednesday, Thursday for 8 hours (includes 1 hour taper up and down).    . baclofen (LIORESAL) 10 MG tablet Take 1 tablet (10 mg total) by mouth 2 (two) times daily as needed for muscle spasms. 60 each 4  . Calcium Carb-Cholecalciferol (CALCIUM + D3 PO) Take 2 tablets by mouth daily.    . cetirizine (ZYRTEC) 10 MG tablet Take 10 mg by mouth daily as needed for allergies.    . clobetasol ointment (TEMOVATE) 6.23 % Apply 1 application topically 2 (two) times daily as needed.    . diphenhydrAMINE (BENADRYL) 25 mg  capsule Take 25-50 mg by mouth every 6 (six) hours as needed for itching or sleep.     . fluticasone (FLONASE) 50 MCG/ACT nasal spray Place into both nostrils daily as needed for allergies or rhinitis.    . furosemide (LASIX) 20 MG tablet TAKE ONE TABLET BY MOUTH AS NEEDED FOR SWELLING 45 tablet 1  . Heparin Sodium, Porcine, (HEPARIN LOCK FLUSH IJ) Inject 5 mLs as directed 4 (four) times a week. Monday, Tuesday, Wednesday, Thursday in the morning with TPN    . Multiple Vitamins-Minerals (ADULT GUMMY PO) Take 2 tablets by mouth daily.    Marland Kitchen omeprazole (PRILOSEC) 40  MG capsule Take 40 mg by mouth daily.    . ondansetron (ZOFRAN-ODT) 8 MG disintegrating tablet TAKE 1 TABLET (8 MG TOTAL) BY MOUTH EVERY 8 (EIGHT) HOURS AS NEEDED FOR NAUSEA OR VOMITING. 30 tablet 11  . sacubitril-valsartan (ENTRESTO) 24-26 MG Take 1 tablet by mouth 2 (two) times daily. 60 tablet 11  . sodium chloride 0.9 % infusion Inject 900 mLs into the vein every 14 (fourteen) days. Uses for TPN    . spironolactone (ALDACTONE) 25 MG tablet Take 1 tablet (25 mg total) by mouth daily. 30 tablet 11  . Teduglutide, rDNA, 5 MG KIT Inject 3.1 application into the skin daily in the afternoon.    . vitamin E 1000 UNIT capsule Take 1,000 Units by mouth daily.    [name] Reasons 20 MG TABS tablet TAKE 1 TABLET (20 MG TOTAL) BY MOUTH DAILY WITH SUPPER. 90 tablet 2   No current facility-administered medications for this visit.    Review of Systems  Constitutional:  Constitutional negative. HENT: HENT negative.  Eyes: Eyes negative.  Respiratory: Respiratory negative.  Cardiovascular: Cardiovascular negative.  GI: Gastrointestinal negative.  Musculoskeletal: Musculoskeletal negative.  Skin: Skin negative.  Neurological: Positive for dizziness.  Hematologic: Hematologic/lymphatic negative.  Psychiatric: Psychiatric negative.        Objective:  Objective   Vitals:   09/25/20 1349 09/25/20 1352  BP: (!) 81/49 (!) 70/46  Pulse: (!) 59    Resp: 16   Temp: 97.9 F (36.6 C)     Physical Exam HENT:     Head: Normocephalic.     Nose:     Comments: Wearing a mask Eyes:     Pupils: Pupils are equal, round, and reactive to light.  Neck:     Vascular: No carotid bruit.  Pulmonary:     Effort: Pulmonary effort is normal.     Breath sounds: Normal breath sounds.  Abdominal:     General: Abdomen is flat.     Palpations: Abdomen is soft.  Musculoskeletal:     Cervical back: Normal range of motion and neck supple.     Comments: Mild right upper extremity edema  Skin:    Capillary Refill: Capillary refill takes less than 2 seconds.  Neurological:     Mental Status: She is alert.  Psychiatric:        Mood and Affect: Mood normal.        Behavior: Behavior normal.        Thought Content: Thought content normal.        Judgment: Judgment normal.     Data: CT IMPRESSION: 1. Negative CTA for large vessel occlusion. 2. 70% atheromatous stenosis at the right carotid bulb/origin of the right ICA. 3. 50% atheromatous stenosis at the left carotid bulb/origin of the left ICA. 4. 5 mm left MCA bifurcation aneurysm.     Assessment/Plan:     70 year old female recently admitted with bilateral stroke found to have bilateral carotid artery stenosis.  This has been followed in the past was noted to be mild stenosis only by duplex.  In reviewing her CT scan with her and her husband today the stenosis is really unimpressive and given that this is considered asymptomatic and her strokes were likely cardioembolic nature I would not recommend any treatment at this time.  She should be on an aspirin and statin if tolerable.  She will follow-up with [place] yearly with carotid duplexes.  I am referring her to neurosurgery for evaluation of 5 mm left MCA  aneurysm.     Waynetta Sandy MD [facility]

## 2020-09-26 ENCOUNTER — Ambulatory Visit (HOSPITAL_BASED_OUTPATIENT_CLINIC_OR_DEPARTMENT_OTHER): Payer: Medicare Other

## 2020-09-26 LAB — FATTY ACID PANEL, ESSENTIAL (C12-C22), SERUM
Arachidic, C20:0: 13.38 umol/L — ABNORMAL LOW (ref 16.8–38.5)
Arachidonic, C20:4w6: 373.83 umol/L (ref 351.4–1057.6)
DHA, C22:6w3: 49.16 umol/L — ABNORMAL LOW (ref 53.3–216.3)
DPA, C22:5w3: 30.77 umol/L (ref 19.9–77.6)
DPA, C22:5w6: 12.02 umol/L (ref 9.2–32.1)
DTA, C22:4w6: 21.71 umol/L (ref 9.1–44.6)
Docosenoic, C22:1: 6.68 umol/L (ref 5.73–11.92)
EPA, C20:5w3: 23.88 umol/L (ref 12.4–123)
Hexadecenoic, C16:1w9: 91.72 umol/L — ABNORMAL HIGH (ref 19.82–59.93)
LAURIC,C12:0: 19.37 umol/L (ref 4.3–36)
Linoleic, C18:2w6: 1935.23 umol/L — ABNORMAL LOW (ref 2653.4–6130.3)
Mead, C20:3w9: 15.59 umol/L (ref 10.3–41.3)
Myristic, C14:0: 179.09 umol/L (ref 39.4–258.2)
Nervonic, C24:1w9: 89.9 umol/L (ref 56.9–132.7)
Oleic, C18:1w9: 3189.76 umol/L (ref 872.4–4182.1)
Palmitic, C16:0: 2776.32 umol/L (ref 1671.1–4602.6)
Palmitoleic, C16:1w7: 418.37 umol/L (ref 68.5–570.2)
Stearic, C18:0: 703.73 umol/L (ref 590.2–1377.2)
Total Fatty Acids: 10.54 umol/L (ref 7.93–18.34)
Total Monounsaturated: 4.12 umol/L (ref 1.45–4.95)
Total Polyunsaturated: 2.66 umol/L — ABNORMAL LOW (ref 3.57–8.11)
Total Saturated: 3.76 umol/L (ref 2.5–6.4)
Total w3: 0.16 umol/L (ref 0.1–0.5)
Total w6: 2.46 umol/L — ABNORMAL LOW (ref 3.3–7.1)
Triene Tetraene Ratio: 0.04 (ref 0.02–0.05)
Vaccenic, C18:1w7: 280.38 umol/L — ABNORMAL HIGH (ref 84.8–260.8)
a Linolenic, C18:3w3: 54.09 umol/L (ref 26.1–150.1)
g Linolenic, C18:3w6: 38.08 umol/L (ref 14.5–144.9)
h g-Linolenic, C20:3w6: 77.81 umol/L (ref 70–319.9)

## 2020-09-26 LAB — PREALBUMIN: Prealbumin: 15 mg/dL — ABNORMAL LOW (ref 17–34)

## 2020-09-27 DIAGNOSIS — I69328 Other speech and language deficits following cerebral infarction: Secondary | ICD-10-CM | POA: Diagnosis not present

## 2020-09-27 DIAGNOSIS — I69359 Hemiplegia and hemiparesis following cerebral infarction affecting unspecified side: Secondary | ICD-10-CM | POA: Diagnosis not present

## 2020-10-02 DIAGNOSIS — I69328 Other speech and language deficits following cerebral infarction: Secondary | ICD-10-CM | POA: Diagnosis not present

## 2020-10-02 DIAGNOSIS — I69359 Hemiplegia and hemiparesis following cerebral infarction affecting unspecified side: Secondary | ICD-10-CM | POA: Diagnosis not present

## 2020-10-03 ENCOUNTER — Other Ambulatory Visit: Payer: Self-pay

## 2020-10-03 ENCOUNTER — Ambulatory Visit (HOSPITAL_BASED_OUTPATIENT_CLINIC_OR_DEPARTMENT_OTHER)
Admission: RE | Admit: 2020-10-03 | Discharge: 2020-10-03 | Disposition: A | Discharge status: Home / Self Care | Payer: Medicare Other | Source: Ambulatory Visit | Attending: Internal Medicine | Admitting: Internal Medicine

## 2020-10-03 DIAGNOSIS — R161 Splenomegaly, not elsewhere classified: Secondary | ICD-10-CM

## 2020-10-03 DIAGNOSIS — K760 Fatty (change of) liver, not elsewhere classified: Secondary | ICD-10-CM | POA: Insufficient documentation

## 2020-10-04 DIAGNOSIS — I69359 Hemiplegia and hemiparesis following cerebral infarction affecting unspecified side: Secondary | ICD-10-CM | POA: Diagnosis not present

## 2020-10-04 DIAGNOSIS — I69328 Other speech and language deficits following cerebral infarction: Secondary | ICD-10-CM | POA: Diagnosis not present

## 2020-10-09 DIAGNOSIS — I69328 Other speech and language deficits following cerebral infarction: Secondary | ICD-10-CM | POA: Diagnosis not present

## 2020-10-09 DIAGNOSIS — I69359 Hemiplegia and hemiparesis following cerebral infarction affecting unspecified side: Secondary | ICD-10-CM | POA: Diagnosis not present

## 2020-10-10 ENCOUNTER — Other Ambulatory Visit (INDEPENDENT_AMBULATORY_CARE_PROVIDER_SITE_OTHER): Payer: Medicare Other

## 2020-10-10 DIAGNOSIS — K912 Postsurgical malabsorption, not elsewhere classified: Secondary | ICD-10-CM

## 2020-10-10 DIAGNOSIS — K90829 Short bowel syndrome, unspecified: Secondary | ICD-10-CM

## 2020-10-10 DIAGNOSIS — D638 Anemia in other chronic diseases classified elsewhere: Secondary | ICD-10-CM

## 2020-10-10 LAB — COMPREHENSIVE METABOLIC PANEL
ALT: 13 U/L (ref 0–35)
AST: 14 U/L (ref 0–37)
Albumin: 2.8 g/dL — ABNORMAL LOW (ref 3.5–5.2)
Alkaline Phosphatase: 98 U/L (ref 39–117)
BUN: 25 mg/dL — ABNORMAL HIGH (ref 6–23)
CO2: 21 mEq/L (ref 19–32)
Calcium: 7.9 mg/dL — ABNORMAL LOW (ref 8.4–10.5)
Chloride: 111 mEq/L (ref 96–112)
Creatinine, Ser: 1.05 mg/dL (ref 0.40–1.20)
GFR: 53.86 mL/min — ABNORMAL LOW (ref >60.00)
Glucose, Bld: 105 mg/dL — ABNORMAL HIGH (ref 70–99)
Potassium: 4 mEq/L (ref 3.5–5.1)
Sodium: 138 mEq/L (ref 135–145)
Total Bilirubin: 0.5 mg/dL (ref 0.2–1.2)
Total Protein: 6.5 g/dL (ref 6.0–8.3)

## 2020-10-10 LAB — PHOSPHORUS: Phosphorus: 2.5 mg/dL (ref 2.3–4.6)

## 2020-10-10 LAB — CBC
HCT: 26.4 % — ABNORMAL LOW (ref 36.0–46.0)
Hemoglobin: 8.5 g/dL — ABNORMAL LOW (ref 12.0–15.0)
MCHC: 32.4 g/dL (ref 30.0–36.0)
MCV: 96.7 fl (ref 78.0–100.0)
Platelets: 52 10*3/uL — ABNORMAL LOW (ref 150.0–400.0)
RBC: 2.73 Mil/uL — ABNORMAL LOW (ref 3.87–5.11)
RDW: 15.6 % — ABNORMAL HIGH (ref 11.5–15.5)
WBC: 7.3 10*3/uL (ref 4.0–10.5)

## 2020-10-10 LAB — VITAMIN B12: Vitamin B-12: 593 pg/mL (ref 211–911)

## 2020-10-10 LAB — MAGNESIUM: Magnesium: 2.1 mg/dL (ref 1.5–2.5)

## 2020-10-10 LAB — C-REACTIVE PROTEIN: CRP: 3.6 mg/dL (ref 0.5–20.0)

## 2020-10-12 ENCOUNTER — Telehealth: Payer: Medicare Other

## 2020-10-15 ENCOUNTER — Other Ambulatory Visit: Payer: Self-pay

## 2020-10-15 ENCOUNTER — Encounter: Payer: Self-pay | Admitting: Family Medicine

## 2020-10-15 ENCOUNTER — Ambulatory Visit (INDEPENDENT_AMBULATORY_CARE_PROVIDER_SITE_OTHER): Payer: Medicare Other | Admitting: Family Medicine

## 2020-10-15 VITALS — BP 90/60 | Temp 99.0°F | Resp 18 | Ht 68.0 in | Wt 143.0 lb

## 2020-10-15 DIAGNOSIS — I255 Ischemic cardiomyopathy: Secondary | ICD-10-CM | POA: Diagnosis not present

## 2020-10-15 DIAGNOSIS — S40862A Insect bite (nonvenomous) of left upper arm, initial encounter: Secondary | ICD-10-CM

## 2020-10-15 DIAGNOSIS — S51851A Open bite of right forearm, initial encounter: Secondary | ICD-10-CM

## 2020-10-15 DIAGNOSIS — W57XXXA Bitten or stung by nonvenomous insect and other nonvenomous arthropods, initial encounter: Secondary | ICD-10-CM

## 2020-10-15 DIAGNOSIS — Z23 Encounter for immunization: Secondary | ICD-10-CM

## 2020-10-15 MED ORDER — CEPHALEXIN 500 MG PO CAPS: 500.0000 mg | ORAL_CAPSULE | Freq: Three times a day (TID) | ORAL | 0 refills | Status: AC

## 2020-10-15 MED ORDER — TRIAMCINOLONE ACETONIDE 0.1 % EX OINT: 1.0000 "application " | TOPICAL_OINTMENT | Freq: Two times a day (BID) | CUTANEOUS | 1 refills | Status: DC

## 2020-10-15 NOTE — Patient Instructions (Signed)
Follow up as needed or as scheduled START the Cephalexin 3x/day for skin infection APPLY the Triamcinolone ointment twice daily as needed for itching Monitor for any spreading redness or fever We have updated your Tetanus shot Call with any questions or concerns Hang in there!

## 2020-10-15 NOTE — Addendum Note (Signed)
Addended byHildred Alamin on: 10/15/2020 01:58 PM   Modules accepted: Orders

## 2020-10-15 NOTE — Progress Notes (Signed)
   Subjective:    Patient ID: [name] [name], female    DOB: [birth year], 70 y.o.   MRN: [id number]  HPI Tick bites- pt has 2 large dogs and [place].  1 week ago had a bite on R upper arm.  Then had a bite in L axilla.  Late last week removed 1 from R inner thigh.  Developed redness and itchy around bites on L arm (deer tick) and R groin.  Denies fever, chills, rash.  Ticks were not engorged.  Pt is concerned about possible infxn due to central line.  Has been applying neosporin.  Tetanus is out of date.   Review of Systems For ROS see HPI   This visit occurred during the SARS-CoV-2 public health emergency.  Safety protocols were in place, including screening questions prior to the visit, additional usage of staff PPE, and extensive cleaning of exam room while observing appropriate contact time as indicated for disinfecting solutions.       Objective:   Physical Exam Vitals reviewed.  Constitutional:      Appearance: Normal appearance. She is not ill-appearing.  HENT:     Head: Normocephalic and atraumatic.  Skin:    General: Skin is warm and dry.     Findings: Erythema (bite site of L upper arm w/ surrounding petechiae and mild erythema, no redness in L axilla, bruising and mild erythema around R upper arm bite site, no redness or induration of R groin) present.  Neurological:     Mental Status: She is alert and oriented to person, place, and time. Mental status is at baseline.  Psychiatric:        Mood and Affect: Mood normal.        Behavior: Behavior normal.        Thought Content: Thought content normal.           Assessment & Plan:  Tick bite- new.  Pt w/ multiple bites.  She thinks all but 1 were dog ticks and the 1 that might have been a deer tick was not engorged.  No rashes, headaches, fevers.  2 bite sites do have small amount of erythema mixed w/ petechiae and bruising (likely due to xarelto).  Given the presence of a PICC line and her hx w/ PICC line  infections, will start Keflex for any possible skin infection.  Topical triamcinolone as needed for itching.  Td updated.  Pt to look for spreading redness, rash, or fever.  Pt expressed understanding and is in agreement w/ plan.

## 2020-10-16 ENCOUNTER — Other Ambulatory Visit: Payer: Self-pay | Admitting: Internal Medicine

## 2020-10-16 ENCOUNTER — Telehealth: Payer: Self-pay | Admitting: Hematology and Oncology

## 2020-10-16 DIAGNOSIS — D696 Thrombocytopenia, unspecified: Secondary | ICD-10-CM

## 2020-10-16 DIAGNOSIS — D649 Anemia, unspecified: Secondary | ICD-10-CM

## 2020-10-16 NOTE — Telephone Encounter (Signed)
Scheduled appt per 6/7 referral. Pt aware.

## 2020-10-17 ENCOUNTER — Telehealth: Payer: Medicare Other

## 2020-10-17 ENCOUNTER — Other Ambulatory Visit (INDEPENDENT_AMBULATORY_CARE_PROVIDER_SITE_OTHER): Payer: Medicare Other

## 2020-10-17 ENCOUNTER — Telehealth: Payer: Self-pay

## 2020-10-17 DIAGNOSIS — D638 Anemia in other chronic diseases classified elsewhere: Secondary | ICD-10-CM | POA: Diagnosis not present

## 2020-10-17 DIAGNOSIS — K912 Postsurgical malabsorption, not elsewhere classified: Secondary | ICD-10-CM | POA: Diagnosis not present

## 2020-10-17 LAB — FOLATE: Folate: 9.7 ng/mL (ref >5.9)

## 2020-10-17 LAB — FERRITIN: Ferritin: 88.8 ng/mL (ref 10.0–291.0)

## 2020-10-17 LAB — IRON: Iron: 81 ug/dL (ref 42–145)

## 2020-10-17 NOTE — Telephone Encounter (Signed)
  Chronic Care Management  Outreach Note   Name: [name] [name] MRN: [id number] DOB: 01-25-1951  Referred by: [name], MD Reason for referral: Telephone Appointment with [facility] Pharmacist, Madelin Rear.   An unsuccessful telephone outreach was attempted today. The patient was referred to the pharmacist for assistance with care management and care coordination.   Telephone appointment with clinical pharmacist today (10/17/2020) at 1pm. If patient immediately returns call, transfer to [phone]. Otherwise, please provide this number so patient can reschedule visit.   Madelin Rear, PharmD, CPP Clinical Pharmacist Practitioner  [facility]  [phone]

## 2020-10-18 DIAGNOSIS — I69328 Other speech and language deficits following cerebral infarction: Secondary | ICD-10-CM | POA: Diagnosis not present

## 2020-10-18 DIAGNOSIS — I69359 Hemiplegia and hemiparesis following cerebral infarction affecting unspecified side: Secondary | ICD-10-CM | POA: Diagnosis not present

## 2020-10-18 LAB — IRON AND TIBC
Iron Saturation: 9 % — CL (ref 15–55)
Iron: 20 ug/dL — ABNORMAL LOW (ref 27–139)
Total Iron Binding Capacity: 227 ug/dL — ABNORMAL LOW (ref 250–450)
UIBC: 207 ug/dL (ref 118–369)

## 2020-10-20 NOTE — Progress Notes (Signed)
[place] NOTE  Patient Care Team: [name], MD as PCP - General Carlean Purl Ofilia Neas, MD as Consulting Physician (Gastroenterology) Nicholas Lose, MD as Consulting Physician (Hematology and Oncology) Madelin Rear, [facility] as Pharmacist (Pharmacist)  CHIEF COMPLAINTS/PURPOSE OF CONSULTATION:  Newly diagnosed thrombocytopenia and anemia  HISTORY OF PRESENTING ILLNESS:  [name] [name] 70 y.o. female is here because of recent diagnosis of thrombocytopenia and anemia. Labs on 10/10/20 Hg 8.5, HCT 26.4, platelets 52, iron 81, ferritin 88.8, folate 9.7. She is currently on Xarelto for secondary stroke prevention. She presents to the clinic today for initial evaluation.  She has had longstanding history of anemia of chronic disease related to heart disease.  Earlier in the year she had stroke and had recovered very well from that.  She was receiving physical therapy.  She gets IV TPN 3 days a week.  She has a central line.  Lately she has been experiencing profound fatigue and she had blood work showing that the platelet count had dropped into the 50s and therefore she was asked to see [place] for follow-up and consultation. In 2016 she was on Aranesp treatments which were discontinued when she did not need it.  Most recently her hemoglobin was at 9.7 but on the June 1 appointment the hemoglobin dropped to 8.5 along with a platelet count which had dropped down as well.  When she had stroke, she was put on Entresto.  I reviewed her records extensively and collaborated the history with the patient.   MEDICAL HISTORY:  Past Medical History:  Diagnosis Date   Abnormal LFTs 2013   Allergic rhinosinusitis    Anemia of chronic disease    At risk for dental problems    Atypical nevus    Bacteremia due to Klebsiella pneumoniae 12/12/2011   Bacterial overgrowth syndrome    Brachial vein thrombus, left ([place]) 10/08/2012   Carnitine deficiency ([place]) 05/25/2018   Carotid stenosis     Carotid US (9/15):  R 40-59%; L 1-39% >> FU 1 year   Closed left subtrochanteric femur fracture (HCC) 12/31/2017   Congestive heart failure (CHF) (HCC)    EF 25-30%   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   Fracture of left clavicle    History of blood transfusion 2013   anemia   Hx of cardiovascular stress test    Myoview (9/15):  inf-apical scar; no ischemia; EF 47% - low risk    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 12/12/2011   Osteoporosis    Pancytopenia 10/07/2011   Pathologic fracture of neck of femur ([place])    Personal history of colonic polyps    Renal insufficiency    hx of yrs ago   Serratia marcescens infection - bactermia assoc w/ PICC 01/18/2015   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HCC)    Splenomegaly    By ultrasound   Stroke ([place]) 07/2020   Thrombophilia ([place])    Vitamin D deficiency     SURGICAL HISTORY: Past Surgical History:  Procedure Laterality Date   APPENDECTOMY  yrs ago   CHOLECYSTECTOMY  yrs ago   COLONOSCOPY  12/05/2005   internal hemorrhoids (for polyp surveillance)   COLONOSCOPY  04/26/2012   Procedure: COLONOSCOPY;  Surgeon: Gatha Mayer, MD;  Location: [facility];  Service: Endoscopy;  Laterality: N/A;   COLONOSCOPY WITH PROPOFOL N/A 03/18/2016   Procedure: COLONOSCOPY WITH PROPOFOL;  Surgeon: Gatha Mayer, MD;  Location: [facility];  Service: Endoscopy;  Laterality: N/A;   ESOPHAGOGASTRODUODENOSCOPY  01/22/2009   erosive esophagitis   HARDWARE REMOVAL Left 12/31/2017   Procedure: HARDWARE REMOVAL;  Surgeon: Rod Can, MD;  Location: [facility];  Service: Orthopedics;  Laterality: Left;   INTRAMEDULLARY (IM) NAIL INTERTROCHANTERIC Left 12/31/2017   Procedure: INTRAMEDULLARY (IM) NAIL SUBTROCHANTRIC;  Surgeon: Rod Can, MD;  Location: [facility];  Service: Orthopedics;  Laterality: Left;   IR CV LINE INJECTION  03/23/2018   IR FLUORO GUIDE CV LINE LEFT  01/01/2018   IR FLUORO GUIDE CV  LINE LEFT  03/24/2018   IR FLUORO GUIDE CV LINE LEFT  05/21/2018   IR FLUORO GUIDE CV LINE LEFT  07/09/2018   IR GENERIC HISTORICAL  07/04/2016   IR REMOVAL TUN CV CATH W/O FL 07/04/2016 Ascencion Dike, PA-C [facility]   IR GENERIC HISTORICAL  07/10/2016   IR US GUIDE VASC ACCESS LEFT 07/10/2016 Arne Cleveland, MD [facility]   IR GENERIC HISTORICAL  07/10/2016   IR FLUORO GUIDE CV LINE LEFT 07/10/2016 Arne Cleveland, MD [facility]   IR PATIENT EVAL TECH 0-60 MINS  10/07/2017   IR PATIENT EVAL TECH 0-60 MINS  03/09/2018   IR RADIOLOGIST EVAL & MGMT  07/21/2017   LEFT HEART CATH AND CORONARY ANGIOGRAPHY N/A 06/26/2020   Procedure: LEFT HEART CATH AND CORONARY ANGIOGRAPHY;  Surgeon: Jolaine Artist, MD;  Location: [facility];  Service: Cardiovascular;  Laterality: N/A;   ORIF PROXIMAL FEMORAL FRACTURE W/ ITST NAIL SYSTEM  03/2007   left, Dr. Shellia Carwin   SMALL INTESTINE SURGERY  2005   multiple with right colon resection for ischemia/infarct    SOCIAL HISTORY: Social History   Socioeconomic History   Marital status: Married    Spouse name: Not on file   Number of children: Not on file   Years of education: Not on file   Highest education level: Not on file  Occupational History   Not on file  Tobacco Use   Smoking status: Former    Pack years: 0.00    Types: Cigarettes    Quit date: 05/12/2004    Years since quitting: 16.4   Smokeless tobacco: Never  Vaping Use   Vaping Use: Never used  Substance and Sexual Activity   Alcohol use: No   Drug use: No   Sexual activity: Yes    Comment: 1st intercourse 64 yo-5 partners  Other Topics Concern   Not on file  Social History Narrative   Married to [place], has 1 daughter and a granddaughter the patient's son died in childhood due to a motor vehicle wreck   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   No alcohol tobacco or drug use   04/07/2017   Social Determinants of Health   Financial  Resource Strain: Not on file  Food Insecurity: Not on file  Transportation Needs: No Transportation Needs   Lack of Transportation (Medical): No   Lack of Transportation (Non-Medical): No  Physical Activity: Not on file  Stress: Not on file  Social Connections: Not on file  Intimate Partner Violence: Not on file    FAMILY HISTORY: Family History  Problem Relation Age of Onset   Diabetes Mother    Hypertension Mother    AAA (abdominal aortic aneurysm) Mother    Colon cancer Neg Hx    Stomach cancer Neg Hx     ALLERGIES:  is allergic to penicillins.  MEDICATIONS:  Current Outpatient Medications  Medication Sig Dispense Refill   acetaminophen (TYLENOL) 500 MG tablet Take 1,000 mg by mouth daily as needed for mild pain.     ADULT TPN Inject 1,800 mLs into the vein 4 (four) times a week. Pt receives home TPN from [facility]:  1800 mL bag, four nights weekly (Monday, Tuesday, Wednesday, Thursday for 8 hours (includes 1 hour taper up and down).     baclofen (LIORESAL) 10 MG tablet Take 1 tablet (10 mg total) by mouth 2 (two) times daily as needed for muscle spasms. 60 each 4   Calcium Carb-Cholecalciferol (CALCIUM + D3 PO) Take 2 tablets by mouth daily.     cetirizine (ZYRTEC) 10 MG tablet Take 10 mg by mouth daily as needed for allergies.     clobetasol ointment (TEMOVATE) 1.93 % Apply 1 application topically 2 (two) times daily as needed. (Patient not taking: Reported on 10/15/2020)     diphenhydrAMINE (BENADRYL) 25 mg capsule Take 25-50 mg by mouth every 6 (six) hours as needed for itching or sleep.      fluticasone (FLONASE) 50 MCG/ACT nasal spray Place into both nostrils daily as needed for allergies or rhinitis.     furosemide (LASIX) 20 MG tablet TAKE ONE TABLET BY MOUTH AS NEEDED FOR SWELLING 45 tablet 1   Heparin Sodium, Porcine, (HEPARIN LOCK FLUSH IJ) Inject 5 mLs as directed 4 (four) times a week. Monday, Tuesday, Wednesday, Thursday in the morning with TPN     Multiple  Vitamins-Minerals (ADULT GUMMY PO) Take 2 tablets by mouth daily.     omeprazole (PRILOSEC) 40 MG capsule Take 40 mg by mouth daily.     ondansetron (ZOFRAN-ODT) 8 MG disintegrating tablet TAKE 1 TABLET (8 MG TOTAL) BY MOUTH EVERY 8 (EIGHT) HOURS AS NEEDED FOR NAUSEA OR VOMITING. 30 tablet 11   sacubitril-valsartan (ENTRESTO) 24-26 MG Take 1 tablet by mouth 2 (two) times daily. 60 tablet 11   sodium chloride 0.9 % infusion Inject 900 mLs into the vein every 14 (fourteen) days. Uses for TPN     spironolactone (ALDACTONE) 25 MG tablet Take 1 tablet (25 mg total) by mouth daily. 30 tablet 11   Teduglutide, rDNA, 5 MG KIT Inject 3.1 application into the skin daily in the afternoon.     triamcinolone ointment (KENALOG) 0.1 % Apply 1 application topically 2 (two) times daily. 90 g 1   vitamin E 1000 UNIT capsule Take 1,000 Units by mouth daily.     XARELTO 20 MG TABS tablet TAKE 1 TABLET (20 MG TOTAL) BY MOUTH DAILY WITH SUPPER. 90 tablet 2   No current facility-administered medications for this visit.    REVIEW OF SYSTEMS:   Constitutional:  abnormal night sweats Severe fatigue intermittent fevers All other systems were reviewed with the patient and are negative.  PHYSICAL EXAMINATION: ECOG PERFORMANCE STATUS: 1 - Symptomatic but completely ambulatory  Vitals:   10/22/20 1257  BP: 102/75  Pulse: (!) 57  Resp: 16  Temp: 97.9 F (36.6 C)  SpO2: 100%   Filed Weights   10/22/20 1257  Weight: 143 lb 3.2 oz (65 kg)     LABORATORY DATA:  I have reviewed the data as listed Lab Results  Component Value Date   WBC 4.9 10/22/2020   HGB 8.5 (L) 10/22/2020   HCT 27.1 (L) 10/22/2020   MCV 98.5 10/22/2020   PLT 92 (L) 10/22/2020   Lab Results  Component Value Date   NA 138 10/10/2020  K 4.0 10/10/2020   CL 111 10/10/2020   CO2 21 10/10/2020    RADIOGRAPHIC STUDIES: I have personally reviewed the radiological reports and agreed with the findings in the report.  ASSESSMENT AND  PLAN:  Anemia of chronic disease Normochromic normocytic anemia due 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 She received last Aranesp injection February 2016. She had required a total of 3 Aranesp injections in 2015, while she received at least 10 injections in 2014. So we discontinued therapy.   Current treatment: Recommended reinitiating therapy with Aranesp.  I sent new order and she will started this Friday.  She gets labs drawn with her lab our clinic for GI the first Monday of every month.  We will try to treat her with Aranesp on the first Friday of every month.  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 This is the reason for her chronic disease.   Thrombocytopenia ([place]) Patient's baseline platelet count is around 100 09/17/2020: Hemoglobin 9.7, platelets 92 10/10/2020: Hemoglobin 8.5, platelets 52 10/22/2020: Hemoglobin 8.5, platelets 92  Thrombocytopenia: She is back to her baseline.  Therefore no further work-up is necessary at this time.  Return to clinic next month to review her labs and follow-up.   All questions were answered. The patient knows to call the clinic with any problems, questions or concerns.   Rulon Eisenmenger, MD, MPH 10/22/2020   I, Molly Dorshimer, am acting as scribe for Nicholas Lose, MD.  I have reviewed the above documentation for accuracy and completeness, and I agree with the above.

## 2020-10-22 ENCOUNTER — Inpatient Hospital Stay: Payer: Medicare Other | Attending: Hematology and Oncology | Admitting: Hematology and Oncology

## 2020-10-22 ENCOUNTER — Other Ambulatory Visit: Payer: Self-pay

## 2020-10-22 ENCOUNTER — Inpatient Hospital Stay: Payer: Medicare Other

## 2020-10-22 ENCOUNTER — Other Ambulatory Visit: Payer: Self-pay | Admitting: Hematology and Oncology

## 2020-10-22 DIAGNOSIS — Z86718 Personal history of other venous thrombosis and embolism: Secondary | ICD-10-CM | POA: Insufficient documentation

## 2020-10-22 DIAGNOSIS — Z87891 Personal history of nicotine dependence: Secondary | ICD-10-CM | POA: Diagnosis not present

## 2020-10-22 DIAGNOSIS — D638 Anemia in other chronic diseases classified elsewhere: Secondary | ICD-10-CM

## 2020-10-22 DIAGNOSIS — D649 Anemia, unspecified: Secondary | ICD-10-CM | POA: Diagnosis not present

## 2020-10-22 DIAGNOSIS — K912 Postsurgical malabsorption, not elsewhere classified: Secondary | ICD-10-CM

## 2020-10-22 DIAGNOSIS — I110 Hypertensive heart disease with heart failure: Secondary | ICD-10-CM | POA: Insufficient documentation

## 2020-10-22 DIAGNOSIS — I509 Heart failure, unspecified: Secondary | ICD-10-CM | POA: Insufficient documentation

## 2020-10-22 DIAGNOSIS — Z7901 Long term (current) use of anticoagulants: Secondary | ICD-10-CM | POA: Insufficient documentation

## 2020-10-22 DIAGNOSIS — D696 Thrombocytopenia, unspecified: Secondary | ICD-10-CM

## 2020-10-22 DIAGNOSIS — Z9989 Dependence on other enabling machines and devices: Secondary | ICD-10-CM

## 2020-10-22 DIAGNOSIS — Z79899 Other long term (current) drug therapy: Secondary | ICD-10-CM | POA: Diagnosis not present

## 2020-10-22 LAB — CBC WITH DIFFERENTIAL (CANCER CENTER ONLY)
Abs Immature Granulocytes: 0.01 10*3/uL (ref 0.00–0.07)
Basophils Absolute: 0.1 10*3/uL (ref 0.0–0.1)
Basophils Relative: 1 %
Eosinophils Absolute: 0.3 10*3/uL (ref 0.0–0.5)
Eosinophils Relative: 7 %
HCT: 27.1 % — ABNORMAL LOW (ref 36.0–46.0)
Hemoglobin: 8.5 g/dL — ABNORMAL LOW (ref 12.0–15.0)
Immature Granulocytes: 0 %
Lymphocytes Relative: 43 %
Lymphs Abs: 2.1 10*3/uL (ref 0.7–4.0)
MCH: 30.9 pg (ref 26.0–34.0)
MCHC: 31.4 g/dL (ref 30.0–36.0)
MCV: 98.5 fL (ref 80.0–100.0)
Monocytes Absolute: 0.4 10*3/uL (ref 0.1–1.0)
Monocytes Relative: 7 %
Neutro Abs: 2.1 10*3/uL (ref 1.7–7.7)
Neutrophils Relative %: 42 %
Platelet Count: 92 10*3/uL — ABNORMAL LOW (ref 150–400)
RBC: 2.75 MIL/uL — ABNORMAL LOW (ref 3.87–5.11)
RDW: 14.5 % (ref 11.5–15.5)
WBC Count: 4.9 10*3/uL (ref 4.0–10.5)
nRBC: 0 % (ref 0.0–0.2)

## 2020-10-22 NOTE — Assessment & Plan Note (Signed)
Patient's baseline platelet count is around 100 09/17/2020: Hemoglobin 9.7, platelets 92 10/10/2020: Hemoglobin 8.5, platelets 52  Plan is to recheck labs to make sure this is not related to platelet clumping.

## 2020-10-22 NOTE — Assessment & Plan Note (Signed)
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 She received last Aranesp injection February 2016.   Today her hemoglobin is 10.1 and does not need Aranesp injection. She has required a total of 3 Aranesp injections in 2015, while she received at least 10 injections in 2014. So we discontinued therapy.  Current treatment: Observation Thrombocytopenia: Platelets 105.  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 This is the reason for her chronic disease.  Return to clinic in 1 year for follow-up.

## 2020-10-22 NOTE — Progress Notes (Signed)
I called the lab and I was informed that the Fatty acid panel got sent to [facility] and hopefully will be back this week.

## 2020-10-23 LAB — FATTY ACID PANEL, ESSENTIAL (C12-C22), SERUM
Arachidic, C20:0: 13.99 umol/L — ABNORMAL LOW (ref 16.8–38.5)
Arachidonic, C20:4w6: 319.27 umol/L — ABNORMAL LOW (ref 351.4–1057.6)
DHA, C22:6w3: 49.51 umol/L — ABNORMAL LOW (ref 53.3–216.3)
DPA, C22:5w3: 20.09 umol/L (ref 19.9–77.6)
DPA, C22:5w6: 12.32 umol/L (ref 9.2–32.1)
DTA, C22:4w6: 18.83 umol/L (ref 9.1–44.6)
Docosenoic, C22:1: 5.76 umol/L (ref 5.73–11.92)
EPA, C20:5w3: 21.05 umol/L (ref 12.4–123)
Hexadecenoic, C16:1w9: 45.42 umol/L (ref 19.82–59.93)
LAURIC,C12:0: 15.08 umol/L (ref 4.3–36)
Linoleic, C18:2w6: 1540.17 umol/L — ABNORMAL LOW (ref 2653.4–6130.3)
Mead, C20:3w9: 16.97 umol/L (ref 10.3–41.3)
Myristic, C14:0: 98.05 umol/L (ref 39.4–258.2)
Nervonic, C24:1w9: 83.36 umol/L (ref 56.9–132.7)
Oleic, C18:1w9: 2164.31 umol/L (ref 872.4–4182.1)
Palmitic, C16:0: 2229.56 umol/L (ref 1671.1–4602.6)
Palmitoleic, C16:1w7: 289.14 umol/L (ref 68.5–570.2)
Stearic, C18:0: 477.66 umol/L — ABNORMAL LOW (ref 590.2–1377.2)
Total Fatty Acids: 7.82 umol/L — ABNORMAL LOW (ref 7.93–18.34)
Total Monounsaturated: 2.78 umol/L (ref 1.45–4.95)
Total Polyunsaturated: 2.16 umol/L — ABNORMAL LOW (ref 3.57–8.11)
Total Saturated: 2.89 umol/L (ref 2.5–6.4)
Total w3: 0.12 umol/L (ref 0.1–0.5)
Total w6: 2.01 umol/L — ABNORMAL LOW (ref 3.3–7.1)
Triene Tetraene Ratio: 0.05 (ref 0.02–0.05)
Vaccenic, C18:1w7: 165.27 umol/L (ref 84.8–260.8)
a Linolenic, C18:3w3: 31.52 umol/L (ref 26.1–150.1)
g Linolenic, C18:3w6: 50.13 umol/L (ref 14.5–144.9)
h g-Linolenic, C20:3w6: 67.44 umol/L — ABNORMAL LOW (ref 70–319.9)

## 2020-10-23 LAB — PREALBUMIN: Prealbumin: 14 mg/dL — ABNORMAL LOW (ref 17–34)

## 2020-10-23 LAB — ERYTHROPOIETIN: Erythropoietin: 16.1 m[IU]/mL (ref 2.6–18.5)

## 2020-10-24 ENCOUNTER — Encounter: Payer: Self-pay | Admitting: Hematology and Oncology

## 2020-10-24 ENCOUNTER — Other Ambulatory Visit: Payer: Self-pay | Admitting: Cardiovascular Disease

## 2020-10-24 ENCOUNTER — Other Ambulatory Visit: Payer: Self-pay | Admitting: Adult Health

## 2020-10-24 DIAGNOSIS — I5022 Chronic systolic (congestive) heart failure: Secondary | ICD-10-CM

## 2020-10-26 ENCOUNTER — Other Ambulatory Visit: Payer: Self-pay

## 2020-10-26 ENCOUNTER — Inpatient Hospital Stay: Payer: Medicare Other

## 2020-10-26 ENCOUNTER — Telehealth: Payer: Self-pay

## 2020-10-26 VITALS — BP 96/52 | HR 61 | Temp 98.3°F | Resp 16

## 2020-10-26 DIAGNOSIS — K56609 Unspecified intestinal obstruction, unspecified as to partial versus complete obstruction: Secondary | ICD-10-CM | POA: Diagnosis not present

## 2020-10-26 DIAGNOSIS — T80211A Bloodstream infection due to central venous catheter, initial encounter: Secondary | ICD-10-CM | POA: Diagnosis not present

## 2020-10-26 DIAGNOSIS — D638 Anemia in other chronic diseases classified elsewhere: Secondary | ICD-10-CM

## 2020-10-26 DIAGNOSIS — R0602 Shortness of breath: Secondary | ICD-10-CM | POA: Diagnosis not present

## 2020-10-26 DIAGNOSIS — Z20822 Contact with and (suspected) exposure to covid-19: Secondary | ICD-10-CM | POA: Diagnosis not present

## 2020-10-26 DIAGNOSIS — R6883 Chills (without fever): Secondary | ICD-10-CM

## 2020-10-26 DIAGNOSIS — R509 Fever, unspecified: Secondary | ICD-10-CM | POA: Diagnosis not present

## 2020-10-26 DIAGNOSIS — D6869 Other thrombophilia: Secondary | ICD-10-CM | POA: Diagnosis not present

## 2020-10-26 DIAGNOSIS — I959 Hypotension, unspecified: Secondary | ICD-10-CM | POA: Diagnosis not present

## 2020-10-26 DIAGNOSIS — D696 Thrombocytopenia, unspecified: Secondary | ICD-10-CM | POA: Diagnosis not present

## 2020-10-26 DIAGNOSIS — K912 Postsurgical malabsorption, not elsewhere classified: Secondary | ICD-10-CM | POA: Diagnosis not present

## 2020-10-26 MED ORDER — DARBEPOETIN ALFA 300 MCG/0.6ML IJ SOSY
300.0000 ug | PREFILLED_SYRINGE | Freq: Once | INTRAMUSCULAR | Status: AC
  Administered 2020-10-26: 300 ug via SUBCUTANEOUS

## 2020-10-26 MED ORDER — DARBEPOETIN ALFA 300 MCG/0.6ML IJ SOSY
PREFILLED_SYRINGE | INTRAMUSCULAR | Status: AC
  Filled 2020-10-26: qty 0.6

## 2020-10-26 NOTE — Telephone Encounter (Signed)
Spoke with Tiffany in IR, order placed for IR Rad eval, they will work the pt in today. Pt knows to expect a call from IR.

## 2020-10-26 NOTE — Telephone Encounter (Signed)
-----   Message from Gatha Mayer, MD sent at 10/26/2020  8:41 AM EDT ----- Regarding: please call IR for her Patient having chills when she infuses her TPN  A blood culture is negative so far  I would like her evaluated by interventional radiology who manages her port  Please call them today and see if somebody could check her out today or Monday at the latest and let me know  I have messaged her back by MyChart, I did try to call but she did not pick up I suggest you call her before you call IR just to make sure she can go there though I think she can  Thank you very much

## 2020-10-26 NOTE — Telephone Encounter (Signed)
Spoke to [name] [name]  She will need to go to hospital in AM - to ED  Is going to need central line removed and tip cultured and peripheral cultures and be admitted while cultures are processed. Then if cultures neg can get another line   May need ID consult to help.  Not a GI issue per se but she is my patient and I know her as well as anybody so if ?'s call me [phone] - she has my # also

## 2020-10-26 NOTE — Telephone Encounter (Signed)
Tiffany from IR called back and per the radiologist pt does not have a port she has a central line. Radiologist reports this is not something they can handle as an outpt, state pt needs an admission with an ID consult and blood cultures. Please advise.

## 2020-10-26 NOTE — Progress Notes (Signed)
Pt's last cbc was on 6/13 and hemoglobin reading 8.5, MD Lindi Adie is aware and gave the okay to give Aranesp inj today.

## 2020-10-26 NOTE — Telephone Encounter (Signed)
Pt calling back wanting to know if Dr. Carlean Purl has a plan for her central line yet. Reports she has to see Dr. Lindi Adie at 3:30pm today for her aranesp injection and will not be home for about an hour. Please advise.

## 2020-10-27 ENCOUNTER — Inpatient Hospital Stay (HOSPITAL_COMMUNITY)
Admission: EM | Admit: 2020-10-27 | Discharge: 2020-11-01 | DRG: 315 | Disposition: A | Discharge status: Home / Self Care | Payer: Medicare Other | Attending: Internal Medicine | Admitting: Internal Medicine

## 2020-10-27 ENCOUNTER — Encounter (HOSPITAL_COMMUNITY): Payer: Self-pay | Admitting: *Deleted

## 2020-10-27 DIAGNOSIS — T80211A Bloodstream infection due to central venous catheter, initial encounter: Principal | ICD-10-CM | POA: Diagnosis present

## 2020-10-27 DIAGNOSIS — R7881 Bacteremia: Secondary | ICD-10-CM | POA: Diagnosis present

## 2020-10-27 DIAGNOSIS — Z20822 Contact with and (suspected) exposure to covid-19: Secondary | ICD-10-CM | POA: Diagnosis present

## 2020-10-27 DIAGNOSIS — D638 Anemia in other chronic diseases classified elsewhere: Secondary | ICD-10-CM

## 2020-10-27 DIAGNOSIS — K912 Postsurgical malabsorption, not elsewhere classified: Secondary | ICD-10-CM | POA: Diagnosis present

## 2020-10-27 DIAGNOSIS — R0602 Shortness of breath: Secondary | ICD-10-CM | POA: Diagnosis present

## 2020-10-27 DIAGNOSIS — K6389 Other specified diseases of intestine: Secondary | ICD-10-CM | POA: Diagnosis present

## 2020-10-27 DIAGNOSIS — Z880 Allergy status to penicillin: Secondary | ICD-10-CM

## 2020-10-27 DIAGNOSIS — K5289 Other specified noninfective gastroenteritis and colitis: Secondary | ICD-10-CM | POA: Diagnosis present

## 2020-10-27 DIAGNOSIS — I5022 Chronic systolic (congestive) heart failure: Secondary | ICD-10-CM | POA: Diagnosis present

## 2020-10-27 DIAGNOSIS — K760 Fatty (change of) liver, not elsewhere classified: Secondary | ICD-10-CM | POA: Diagnosis present

## 2020-10-27 DIAGNOSIS — I959 Hypotension, unspecified: Secondary | ICD-10-CM | POA: Diagnosis present

## 2020-10-27 DIAGNOSIS — D6859 Other primary thrombophilia: Secondary | ICD-10-CM | POA: Diagnosis present

## 2020-10-27 DIAGNOSIS — Z833 Family history of diabetes mellitus: Secondary | ICD-10-CM

## 2020-10-27 DIAGNOSIS — Z8249 Family history of ischemic heart disease and other diseases of the circulatory system: Secondary | ICD-10-CM

## 2020-10-27 DIAGNOSIS — D631 Anemia in chronic kidney disease: Secondary | ICD-10-CM | POA: Diagnosis present

## 2020-10-27 DIAGNOSIS — Z8719 Personal history of other diseases of the digestive system: Secondary | ICD-10-CM

## 2020-10-27 DIAGNOSIS — D696 Thrombocytopenia, unspecified: Secondary | ICD-10-CM | POA: Diagnosis present

## 2020-10-27 DIAGNOSIS — Y848 Other medical procedures as the cause of abnormal reaction of the patient, or of later complication, without mention of misadventure at the time of the procedure: Secondary | ICD-10-CM | POA: Diagnosis present

## 2020-10-27 DIAGNOSIS — E559 Vitamin D deficiency, unspecified: Secondary | ICD-10-CM | POA: Diagnosis present

## 2020-10-27 DIAGNOSIS — Z7901 Long term (current) use of anticoagulants: Secondary | ICD-10-CM

## 2020-10-27 DIAGNOSIS — K219 Gastro-esophageal reflux disease without esophagitis: Secondary | ICD-10-CM | POA: Diagnosis present

## 2020-10-27 DIAGNOSIS — Z86718 Personal history of other venous thrombosis and embolism: Secondary | ICD-10-CM

## 2020-10-27 DIAGNOSIS — Z8673 Personal history of transient ischemic attack (TIA), and cerebral infarction without residual deficits: Secondary | ICD-10-CM

## 2020-10-27 DIAGNOSIS — K56609 Unspecified intestinal obstruction, unspecified as to partial versus complete obstruction: Secondary | ICD-10-CM | POA: Diagnosis not present

## 2020-10-27 DIAGNOSIS — I6523 Occlusion and stenosis of bilateral carotid arteries: Secondary | ICD-10-CM | POA: Diagnosis present

## 2020-10-27 DIAGNOSIS — D539 Nutritional anemia, unspecified: Secondary | ICD-10-CM | POA: Diagnosis present

## 2020-10-27 DIAGNOSIS — B961 Klebsiella pneumoniae [K. pneumoniae] as the cause of diseases classified elsewhere: Secondary | ICD-10-CM | POA: Diagnosis present

## 2020-10-27 DIAGNOSIS — Z79899 Other long term (current) drug therapy: Secondary | ICD-10-CM

## 2020-10-27 DIAGNOSIS — M810 Age-related osteoporosis without current pathological fracture: Secondary | ICD-10-CM | POA: Diagnosis present

## 2020-10-27 DIAGNOSIS — Z87891 Personal history of nicotine dependence: Secondary | ICD-10-CM

## 2020-10-27 DIAGNOSIS — I6529 Occlusion and stenosis of unspecified carotid artery: Secondary | ICD-10-CM

## 2020-10-27 DIAGNOSIS — D509 Iron deficiency anemia, unspecified: Secondary | ICD-10-CM | POA: Diagnosis present

## 2020-10-27 DIAGNOSIS — D6869 Other thrombophilia: Secondary | ICD-10-CM | POA: Diagnosis present

## 2020-10-27 DIAGNOSIS — N1831 Chronic kidney disease, stage 3a: Secondary | ICD-10-CM | POA: Diagnosis present

## 2020-10-27 DIAGNOSIS — Z9049 Acquired absence of other specified parts of digestive tract: Secondary | ICD-10-CM

## 2020-10-27 HISTORY — DX: Bacteremia: R78.81

## 2020-10-27 LAB — CBC WITH DIFFERENTIAL/PLATELET
Abs Immature Granulocytes: 0.03 10*3/uL (ref 0.00–0.07)
Basophils Absolute: 0.1 10*3/uL (ref 0.0–0.1)
Basophils Relative: 1 %
Eosinophils Absolute: 0.3 10*3/uL (ref 0.0–0.5)
Eosinophils Relative: 5 %
HCT: 28 % — ABNORMAL LOW (ref 36.0–46.0)
Hemoglobin: 8.6 g/dL — ABNORMAL LOW (ref 12.0–15.0)
Immature Granulocytes: 0 %
Lymphocytes Relative: 23 %
Lymphs Abs: 1.6 10*3/uL (ref 0.7–4.0)
MCH: 31 pg (ref 26.0–34.0)
MCHC: 30.7 g/dL (ref 30.0–36.0)
MCV: 101.1 fL — ABNORMAL HIGH (ref 80.0–100.0)
Monocytes Absolute: 0.6 10*3/uL (ref 0.1–1.0)
Monocytes Relative: 8 %
Neutro Abs: 4.3 10*3/uL (ref 1.7–7.7)
Neutrophils Relative %: 63 %
Platelets: 68 10*3/uL — ABNORMAL LOW (ref 150–400)
RBC: 2.77 MIL/uL — ABNORMAL LOW (ref 3.87–5.11)
RDW: 14.6 % (ref 11.5–15.5)
WBC: 7 10*3/uL (ref 4.0–10.5)
nRBC: 0 % (ref 0.0–0.2)

## 2020-10-27 LAB — LACTIC ACID, PLASMA
Lactic Acid, Venous: 0.9 mmol/L (ref 0.5–1.9)
Lactic Acid, Venous: 1.5 mmol/L (ref 0.5–1.9)

## 2020-10-27 LAB — COMPREHENSIVE METABOLIC PANEL
ALT: 16 U/L (ref 0–44)
AST: 19 U/L (ref 15–41)
Albumin: 2.8 g/dL — ABNORMAL LOW (ref 3.5–5.0)
Alkaline Phosphatase: 89 U/L (ref 38–126)
Anion gap: 6 (ref 5–15)
BUN: 31 mg/dL — ABNORMAL HIGH (ref 8–23)
CO2: 22 mmol/L (ref 22–32)
Calcium: 8.1 mg/dL — ABNORMAL LOW (ref 8.9–10.3)
Chloride: 108 mmol/L (ref 98–111)
Creatinine, Ser: 1.2 mg/dL — ABNORMAL HIGH (ref 0.44–1.00)
GFR, Estimated: 49 mL/min — ABNORMAL LOW (ref >60)
Glucose, Bld: 91 mg/dL (ref 70–99)
Potassium: 3.5 mmol/L (ref 3.5–5.1)
Sodium: 136 mmol/L (ref 135–145)
Total Bilirubin: 0.6 mg/dL (ref 0.3–1.2)
Total Protein: 6.9 g/dL (ref 6.5–8.1)

## 2020-10-27 LAB — URINALYSIS, ROUTINE W REFLEX MICROSCOPIC
Bilirubin Urine: NEGATIVE
Glucose, UA: NEGATIVE mg/dL
Hgb urine dipstick: NEGATIVE
Ketones, ur: NEGATIVE mg/dL
Leukocytes,Ua: NEGATIVE
Nitrite: NEGATIVE
Protein, ur: NEGATIVE mg/dL
Specific Gravity, Urine: 1.011 (ref 1.005–1.030)
pH: 5 (ref 5.0–8.0)

## 2020-10-27 LAB — RESP PANEL BY RT-PCR (FLU A&B, COVID) ARPGX2
Influenza A by PCR: NEGATIVE
Influenza B by PCR: NEGATIVE
SARS Coronavirus 2 by RT PCR: NEGATIVE

## 2020-10-27 LAB — CULTURE, BLOOD (SINGLE): Culture: NO GROWTH

## 2020-10-27 MED ORDER — PANTOPRAZOLE SODIUM 40 MG PO TBEC
40.0000 mg | DELAYED_RELEASE_TABLET | Freq: Every day | ORAL | Status: DC
  Administered 2020-10-28 – 2020-11-01 (×5): 40 mg via ORAL
  Filled 2020-10-27 (×5): qty 1

## 2020-10-27 MED ORDER — VANCOMYCIN HCL 1000 MG/200ML IV SOLN
1000.0000 mg | INTRAVENOUS | Status: DC
  Administered 2020-10-28 – 2020-10-29 (×2): 1000 mg via INTRAVENOUS
  Filled 2020-10-27 (×2): qty 200

## 2020-10-27 MED ORDER — ACETAMINOPHEN 325 MG PO TABS
650.0000 mg | ORAL_TABLET | Freq: Four times a day (QID) | ORAL | Status: DC | PRN
Start: 2020-10-27 — End: 2020-11-01
  Administered 2020-10-27: 650 mg via ORAL
  Filled 2020-10-27 (×2): qty 2

## 2020-10-27 MED ORDER — SACUBITRIL-VALSARTAN 24-26 MG PO TABS
1.0000 | ORAL_TABLET | Freq: Two times a day (BID) | ORAL | Status: DC
  Administered 2020-10-27 – 2020-11-01 (×10): 1 via ORAL
  Filled 2020-10-27 (×10): qty 1

## 2020-10-27 MED ORDER — METRONIDAZOLE 500 MG/100ML IV SOLN
500.0000 mg | Freq: Once | INTRAVENOUS | Status: AC
  Administered 2020-10-27: 500 mg via INTRAVENOUS
  Filled 2020-10-27: qty 100

## 2020-10-27 MED ORDER — ONDANSETRON HCL 4 MG PO TABS
4.0000 mg | ORAL_TABLET | Freq: Four times a day (QID) | ORAL | Status: DC | PRN
  Administered 2020-10-28 – 2020-10-29 (×2): 4 mg via ORAL
  Filled 2020-10-27 (×2): qty 1

## 2020-10-27 MED ORDER — VANCOMYCIN HCL 1250 MG/250ML IV SOLN
1250.0000 mg | Freq: Once | INTRAVENOUS | Status: AC
  Administered 2020-10-27: 1250 mg via INTRAVENOUS
  Filled 2020-10-27: qty 250

## 2020-10-27 MED ORDER — LORATADINE 10 MG PO TABS: 10.0000 mg | ORAL_TABLET | Freq: Every day | ORAL | Status: DC | PRN

## 2020-10-27 MED ORDER — SPIRONOLACTONE 25 MG PO TABS
25.0000 mg | ORAL_TABLET | Freq: Every day | ORAL | Status: DC
  Administered 2020-10-28 – 2020-11-01 (×5): 25 mg via ORAL
  Filled 2020-10-27 (×5): qty 1

## 2020-10-27 MED ORDER — TEDUGLUTIDE (RDNA) 5 MG ~~LOC~~ KIT
3.1000 "application " | PACK | Freq: Every morning | SUBCUTANEOUS | Status: DC
  Administered 2020-10-29: 3.1 via SUBCUTANEOUS

## 2020-10-27 MED ORDER — FLUTICASONE PROPIONATE 50 MCG/ACT NA SUSP
2.0000 | Freq: Every day | NASAL | Status: DC | PRN
  Filled 2020-10-27: qty 16

## 2020-10-27 MED ORDER — ACETAMINOPHEN 650 MG RE SUPP: 650.0000 mg | Freq: Four times a day (QID) | RECTAL | Status: DC | PRN

## 2020-10-27 MED ORDER — CALCIUM + D3 600-800 MG-UNIT PO TABS: ORAL_TABLET | Freq: Every day | ORAL | Status: DC

## 2020-10-27 MED ORDER — SODIUM CHLORIDE 0.9 % IV SOLN
2.0000 g | Freq: Once | INTRAVENOUS | Status: AC
  Administered 2020-10-27: 2 g via INTRAVENOUS
  Filled 2020-10-27: qty 2

## 2020-10-27 MED ORDER — ONDANSETRON HCL 4 MG/2ML IJ SOLN: 4.0000 mg | Freq: Four times a day (QID) | INTRAMUSCULAR | Status: DC | PRN

## 2020-10-27 MED ORDER — TRIAMCINOLONE ACETONIDE 0.1 % EX OINT
1.0000 "application " | TOPICAL_OINTMENT | Freq: Two times a day (BID) | CUTANEOUS | Status: DC
  Administered 2020-10-27 – 2020-11-01 (×8): 1 via TOPICAL
  Filled 2020-10-27: qty 80

## 2020-10-27 MED ORDER — DIPHENHYDRAMINE HCL 25 MG PO CAPS
25.0000 mg | ORAL_CAPSULE | Freq: Four times a day (QID) | ORAL | Status: DC | PRN
  Administered 2020-10-31: 25 mg via ORAL
  Filled 2020-10-27: qty 1

## 2020-10-27 MED ORDER — ONDANSETRON 8 MG PO TBDP
8.0000 mg | ORAL_TABLET | Freq: Once | ORAL | Status: AC
  Administered 2020-10-27: 8 mg via ORAL
  Filled 2020-10-27: qty 1

## 2020-10-27 MED ORDER — CALCIUM CARBONATE-VITAMIN D 500-200 MG-UNIT PO TABS
1.0000 | ORAL_TABLET | Freq: Every day | ORAL | Status: DC
  Administered 2020-10-28 – 2020-11-01 (×5): 1 via ORAL
  Filled 2020-10-27 (×5): qty 1

## 2020-10-27 MED ORDER — LIDOCAINE HCL 2 % IJ SOLN
INTRAMUSCULAR | Status: AC
  Filled 2020-10-27: qty 20

## 2020-10-27 MED ORDER — FUROSEMIDE 40 MG PO TABS: 20.0000 mg | ORAL_TABLET | Freq: Every day | ORAL | Status: DC | PRN

## 2020-10-27 MED ORDER — SODIUM CHLORIDE 0.9 % IV BOLUS: 1000.0000 mL | Freq: Once | INTRAVENOUS | Status: DC

## 2020-10-27 MED ORDER — RIVAROXABAN 20 MG PO TABS
20.0000 mg | ORAL_TABLET | Freq: Every day | ORAL | Status: DC
  Administered 2020-10-27 – 2020-10-30 (×4): 20 mg via ORAL
  Filled 2020-10-27 (×4): qty 1

## 2020-10-27 NOTE — H&P (Signed)
History and Physical    [name] [name] BSJ:[id number] DOB: Feb 20, 1951 DOA: 10/27/2020  PCP: Gatha Mayer, MD   Patient coming from: Home.   I have personally briefly reviewed patient's old medical records in [place]  Chief Complaint: Chills and sweats.  HPI: [name] [name] is a 70 y.o. female with medical history significant of abnormal hepatic functions, allergic rhinosinusitis, chronic disease normocytic anemia, atypical nevus, Klebsiella pneumoniae bacteremia, therapy marcescens bacteremia due to PICC line, bacterial overgrowth syndrome, brachial vein thrombosis, carnitine deficiency, carotid stenosis, closed left subtrochanteric femur fracture, chronic systolic CHF, fracture left clavicle, Candida unspecified infection, osteoporosis, pancytopenia, history of CVA, thrombophilia, vitamin D deficiency, colon polyps, small bowel ischemia, short-bowel syndrome on TPN, history of multiple long-term catheter placements who was referred by her gastroenterologist to come to the emergency department after she has been having subjective fever, chills and night sweats for about a week suspicious for bacteremia.  She has felt nauseous, but denied vomiting, abdominal pain, constipation, melena or hematochezia.  She has frequent diarrhea due to short bowel syndrome.  No dyspnea, chest pain, palpitations, PND, orthopnea, but states he occasionally gets lower extremity edema.  Denies dysuria, frequency hematuria.  Denies polyuria, polydipsia, polyphagia or blurred vision.  ED Course: Initial vital signs were temperature 98 F, pulse 73, respirations 20, BP 97/74 mmHg and O2 sat 99% on room air.  The patient received a 1000 mL normal saline bolus.  Lab work: Her urinalysis was unremarkable.  Normal lactic acid x2.  Coronavirus and influenza PCR negative.  CBC showed a white count of 7.0, hemoglobin 8.6 g/dL and platelets 68.  CMP showed BUN of 31 and creatinine of 1.20 mg/dL.  She had normal electrolytes  when calcium is corrected to an albumin of 2.8 g/dL.  The rest of the hepatic function were within expected range.  Review of Systems: As per HPI otherwise all other systems reviewed and are negative.  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   Fracture of left clavicle    History of blood transfusion 2013   anemia   Hx of cardiovascular stress test    Myoview (9/15):  inf-apical scar; no ischemia; EF 47% - low risk    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 12/12/2011   Osteoporosis    Pancytopenia 10/07/2011   Pathologic fracture of neck of femur ([place])    Personal history of colonic polyps    Renal insufficiency    hx of yrs ago   Serratia marcescens infection - bactermia assoc w/ PICC 01/18/2015   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HCC)    Splenomegaly    By ultrasound   Stroke ([place]) 07/2020   Thrombophilia ([place])    Vitamin D deficiency     Past Surgical History:  Procedure Laterality Date   APPENDECTOMY  yrs ago   CHOLECYSTECTOMY  yrs ago   COLONOSCOPY  12/05/2005   internal hemorrhoids (for polyp surveillance)   COLONOSCOPY  04/26/2012   Procedure: COLONOSCOPY;  Surgeon: Gatha Mayer, MD;  Location: [facility];  Service: Endoscopy;  Laterality: N/A;   COLONOSCOPY WITH PROPOFOL N/A 03/18/2016   Procedure: COLONOSCOPY WITH PROPOFOL;  Surgeon: Gatha Mayer, MD;  Location: [facility];  Service:  Endoscopy;  Laterality: N/A;   ESOPHAGOGASTRODUODENOSCOPY  01/22/2009   erosive esophagitis   HARDWARE REMOVAL Left 12/31/2017   Procedure: HARDWARE REMOVAL;  Surgeon: Rod Can, MD;  Location: [facility];  Service: Orthopedics;  Laterality: Left;   INTRAMEDULLARY (IM) NAIL INTERTROCHANTERIC Left 12/31/2017   Procedure: INTRAMEDULLARY (IM) NAIL SUBTROCHANTRIC;  Surgeon: Rod Can, MD;  Location: [facility];  Service: Orthopedics;  Laterality: Left;   IR CV LINE INJECTION  03/23/2018   IR FLUORO GUIDE CV LINE LEFT  01/01/2018   IR FLUORO GUIDE CV LINE LEFT  03/24/2018   IR FLUORO GUIDE CV LINE LEFT  05/21/2018   IR FLUORO GUIDE CV LINE LEFT  07/09/2018   IR GENERIC HISTORICAL  07/04/2016   IR REMOVAL TUN CV CATH W/O FL 07/04/2016 Ascencion Dike, PA-C [facility]   IR GENERIC HISTORICAL  07/10/2016   IR US GUIDE VASC ACCESS LEFT 07/10/2016 Arne Cleveland, MD [facility]   IR GENERIC HISTORICAL  07/10/2016   IR FLUORO GUIDE CV LINE LEFT 07/10/2016 Arne Cleveland, MD [facility]   IR PATIENT EVAL TECH 0-60 MINS  10/07/2017   IR PATIENT EVAL TECH 0-60 MINS  03/09/2018   IR RADIOLOGIST EVAL & MGMT  07/21/2017   LEFT HEART CATH AND CORONARY ANGIOGRAPHY N/A 06/26/2020   Procedure: LEFT HEART CATH AND CORONARY ANGIOGRAPHY;  Surgeon: Jolaine Artist, MD;  Location: [facility];  Service: Cardiovascular;  Laterality: N/A;   ORIF PROXIMAL FEMORAL FRACTURE W/ ITST NAIL SYSTEM  03/2007   left, Dr. Shellia Carwin   SMALL INTESTINE SURGERY  2005   multiple with right colon resection for ischemia/infarct   Social History  reports that she quit smoking about 16 years ago. Her smoking use included cigarettes. She has never used smokeless tobacco. She reports that she does not drink alcohol and does not use drugs.  Allergies  Allergen Reactions   Penicillins Itching, Swelling and Other (See Comments)    Reaction:  Facial swelling Has patient had a PCN reaction causing immediate rash, facial/tongue/throat  swelling, SOB or lightheadedness with hypotension: Yes Has patient had a PCN reaction causing severe rash involving mucus membranes or skin necrosis: No Has patient had a PCN reaction that required hospitalization No Has patient had a PCN reaction occurring within the last 10 years: No If all of the above answers are "NO", then may proceed with Cephalosporin use.   Family History  Problem Relation Age of Onset   Diabetes Mother    Hypertension Mother    AAA (abdominal aortic aneurysm) Mother    Colon cancer Neg Hx    Stomach cancer Neg Hx    Prior to Admission medications   Medication Sig Start Date End Date Taking? Authorizing Provider  acetaminophen (TYLENOL) 500 MG tablet Take 1,000 mg by mouth daily as needed for mild pain.    [provider]  ADULT TPN Inject 1,800 mLs into the vein 4 (four) times a week. Pt receives home TPN from [facility]:  1800 mL bag, four nights weekly (Monday, Tuesday, Wednesday, Thursday for 8 hours (includes 1  hour taper up and down).    [provider]  baclofen (LIORESAL) 10 MG tablet Take 1 tablet (10 mg total) by mouth 2 (two) times daily as needed for muscle spasms. 08/29/20   Frann Rider, NP  Calcium Carb-Cholecalciferol (CALCIUM + D3 PO) Take 2 tablets by mouth daily.    [provider]  cetirizine (ZYRTEC) 10 MG tablet Take 10 mg by mouth daily as needed for allergies.    [provider]  clobetasol ointment (TEMOVATE) 9.51 % Apply 1 application topically 2 (two) times daily as needed. Patient not taking: Reported on 10/15/2020    [provider]  diphenhydrAMINE (BENADRYL) 25 mg capsule Take 25-50 mg by mouth every 6 (six) hours as needed for itching or sleep.     [provider]  fluticasone (FLONASE) 50 MCG/ACT nasal spray Place into both nostrils daily as needed for allergies or rhinitis.    [provider]  furosemide (LASIX) 20 MG tablet TAKE ONE TABLET BY MOUTH AS NEEDED FOR SWELLING  08/01/20   Bensimhon, Shaune Pascal, MD  Heparin Sodium, Porcine, (HEPARIN LOCK FLUSH IJ) Inject 5 mLs as directed 4 (four) times a week. Monday, Tuesday, Wednesday, Thursday in the morning with TPN    [provider]  Multiple Vitamins-Minerals (ADULT GUMMY PO) Take 2 tablets by mouth daily.    [provider]  omeprazole (PRILOSEC) 40 MG capsule Take 40 mg by mouth daily.    [provider]  ondansetron (ZOFRAN-ODT) 8 MG disintegrating tablet TAKE 1 TABLET (8 MG TOTAL) BY MOUTH EVERY 8 (EIGHT) HOURS AS NEEDED FOR NAUSEA OR VOMITING. 12/05/19   Gatha Mayer, MD  sacubitril-valsartan (ENTRESTO) 24-26 MG Take 1 tablet by mouth 2 (two) times daily. 05/09/20   Josue Hector, MD  sodium chloride 0.9 % infusion Inject 900 mLs into the vein every 14 (fourteen) days. Uses for TPN 10/27/17   [provider]  spironolactone (ALDACTONE) 25 MG tablet Take 1 tablet (25 mg total) by mouth daily. 07/04/20   Bensimhon, Shaune Pascal, MD  Teduglutide, rDNA, 5 MG KIT Inject 3.1 application into the skin daily in the afternoon.    [provider]  triamcinolone ointment (KENALOG) 0.1 % Apply 1 application topically 2 (two) times daily. 10/15/20 10/15/21  [name], MD  vitamin E 1000 UNIT capsule Take 1,000 Units by mouth daily.    [provider]  XARELTO 20 MG TABS tablet TAKE 1 TABLET (20 MG TOTAL) BY MOUTH DAILY WITH SUPPER. 02/17/20   [name], MD   Physical Exam: Vitals:   10/27/20 0824 10/27/20 1120 10/27/20 1136  BP: (!) 117/47 (!) 90/46 (!) 92/58  Pulse: 98 (!) 48   Resp: 18 14   Temp: 97.7 F (36.5 C)    TempSrc: Oral    SpO2: 100% 100%    Constitutional: NAD, calm, comfortable Eyes: PERRL, lids and conjunctivae normal ENMT: Mucous membranes are moist. Posterior pharynx clear of any exudate or lesions. Neck: normal, supple, no masses, no thyromegaly Respiratory: clear to auscultation bilaterally, no wheezing, no crackles. Normal  respiratory effort. No accessory muscle use.  Cardiovascular: Regular rate and rhythm, no murmurs / rubs / gallops. No extremity edema. 2+ pedal pulses. No carotid bruits.  Abdomen: No distention.  Bowel sounds positive.  Soft, no tenderness, no masses palpated. No hepatosplenomegaly. Musculoskeletal: no clubbing / cyanosis.  Good ROM, no contractures. Normal muscle tone.  Skin: Multiple areas some mild ecchymosis, particularly in extremities. Neurologic: CN  2-12 grossly intact. Sensation intact, DTR normal. Strength 5/5 in all 4.  Psychiatric: Normal judgment and insight. Alert and oriented x 3. Normal mood.   Labs on Admission: I have personally reviewed following labs and imaging studies  CBC: Recent Labs  Lab 10/22/20 1351 10/27/20 0947  WBC 4.9 7.0  NEUTROABS 2.1 4.3  HGB 8.5* 8.6*  HCT 27.1* 28.0*  MCV 98.5 101.1*  PLT 92* 68*    Basic Metabolic Panel: Recent Labs  Lab 10/27/20 0947  NA 136  K 3.5  CL 108  CO2 22  GLUCOSE 91  BUN 31*  CREATININE 1.20*  CALCIUM 8.1*    GFR: Estimated Creatinine Clearance: 44 mL/min (A) (by C-G formula based on SCr of 1.2 mg/dL (H)).  Liver Function Tests: Recent Labs  Lab 10/27/20 0947  AST 19  ALT 16  ALKPHOS 89  BILITOT 0.6  PROT 6.9  ALBUMIN 2.8*    Urine analysis:    Component Value Date/Time   COLORURINE YELLOW 10/27/2020 0913   APPEARANCEUR CLEAR 10/27/2020 0913   LABSPEC 1.011 10/27/2020 0913   PHURINE 5.0 10/27/2020 0913   GLUCOSEU NEGATIVE 10/27/2020 0913   HGBUR NEGATIVE 10/27/2020 0913        BILIRUBINUR NEGATIVE 10/27/2020 0913        KETONESUR NEGATIVE 10/27/2020 0913   PROTEINUR NEGATIVE 10/27/2020 0913        NITRITE NEGATIVE 10/27/2020 0913   LEUKOCYTESUR NEGATIVE 10/27/2020 0913    Radiological Exams on Admission: No results found.  EKG: Independently reviewed.  Assessment/Plan Principal Problem:   Bacteremia Observation/telemetry. Continue cefepime per pharmacy. Continue  metronidazole 500 mg IV every 8 hours. IR will remove catheter later/send tip for C&S. She can continue Xarelto per Dr. Vernard Gambles. Line replacement may be done Monday. Discussed with ID (Dr. Levonne Spiller). Follow-up blood culture and sensitivity.  Active Problems:   Acquired short bowel syndrome Family consulted for inpatient TPN administration.    Chronic systolic heart failure (HCC) Continue Entresto twice daily. Continue spironolactone 25 mg p.o. daily. Continue furosemide as needed. Not on beta-blocker) hypotension?).    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 Continue rivaroxaban 20 mg daily.    Hypotension Continue normal saline infusion. Monitor blood pressure closely.    Thrombocytopenia (HCC) Monitor platelet count.    CKD (chronic kidney disease), stage III ([place]) Around baseline. Continue IV hydration. Monitor electrolytes, BUN, creatinine and GFR.    Macrocytic anemia Recent anemia panel showed iron deficiency. She received iron supplementation. Monitor hematocrit and hemoglobin.    GERD (gastroesophageal reflux disease) Continue Protonix 40 mg p.o. daily.    DVT prophylaxis: On Xarelto. Code Status:   Full code. Family Communication:  Her husband was in the ED room. Disposition Plan:   Patient is from:  Home.  Anticipated DC to:  Home.  Anticipated DC date:  10/29/2020.  Anticipated DC barriers: Consultant sign off. Consults called:  IR Arne Cleveland, MD). ID Rosiland Oz, MD) Admission status:  Observation/progressive unit.   Severity of Illness: High severity due to bacteremia in the setting of line sepsis.  The patient will need to be admitted to remove current line, culture tip and have IR placed a new catheter.  Reubin Milan MD [facility]  How to contact the [facility] Attending or Consulting provider [place] or covering provider during after hours [place], for this patient?   Check the care team in [facility]  and look for a) attending/consulting [facility] provider  listed and b) the [facility] team listed Log into [email or web address] and use [place]'s universal password to access. If you do not have the password, please contact the hospital operator. Locate the [facility] provider you are looking for under [facility] and page to a number that you can be directly reached. If you still have difficulty reaching the provider, please page the [facility] (Director on Call) for the Hospitalists listed on amion for assistance.  10/27/2020, 12:29 PM   This document was prepared using Dragon voice recognition software and may contain some unintended transcription errors.

## 2020-10-27 NOTE — ED Provider Notes (Signed)
[facility] DEPT Provider Note   CSN: [id number] Arrival date & time: 10/27/20  0813     History Chief Complaint  Patient presents with   central line issue   Chills    [name] [name] is a 70 y.o. female.  HPI  Patient presents with chills, night sweats, possible central line infection.  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  She does TPN 4 times a week.  Symptoms started 1 week ago, but worsened last night during TPA.  Last night she had chills, shortness of breath, was not acting like herself.  She does not remember the events of last night.  States that about a week ago her GI doctor Dr. Arelia Longest had cultures drawn, but those were negative.  She spoke with him yesterday and he advised her to come into the ED to be admitted by GI to have the central line removed and the tip cultured.    She had a stroke in March of this year and is on Xarelto.  She is not complaining of any abdominal pain, site infection, pustulant's along the central line.  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   Carnitine deficiency ([place]) 05/25/2018   Carotid stenosis    Carotid US (9/15):  R 40-59%; L 1-39% >> FU 1 year   Closed left subtrochanteric femur fracture (HCC) 12/31/2017   Congestive heart failure (CHF) (HCC)    EF 25-30%   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   Fracture of left clavicle    History of blood transfusion 2013   anemia   Hx of cardiovascular stress test    Myoview (9/15):  inf-apical scar; no ischemia; EF 47% - low risk    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 12/12/2011   Osteoporosis    Pancytopenia 10/07/2011   Pathologic fracture of neck of femur ([place])     Personal history of colonic polyps    Renal insufficiency    hx of yrs ago   Serratia marcescens infection - bactermia assoc w/ PICC 01/18/2015   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HCC)    Splenomegaly    By ultrasound   Stroke ([place]) 07/2020   Thrombophilia ([place])    Vitamin D deficiency     Patient Active Problem List   Diagnosis Date Noted   NAFLD (nonalcoholic fatty liver disease) 09/21/2020   Stroke (cerebrum) ([place]) 07/25/2020   Cerebrovascular accident (CVA) ([place]) 07/24/2020   Thrombocytopenia ([place]) 07/24/2020   CKD (chronic kidney disease), stage III ([place]) 07/24/2020   Pressure injury of skin 07/24/2020   Stroke ([place]) 07/23/2020   Carnitine deficiency ([place]) 05/25/2018   Hypotension 12/30/2017   Bradycardia 12/30/2017   Deep venous thrombosis ([place]) left subclavian vein 07/31/2017   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[place]) 07/31/2017   Degenerative joint disease 07/08/2016   PICC line infection 05/16/2015   H/O mesenteric infarction 11/17/2013   Splenomegaly    Hx of adenomatous colonic polyps 75/91/6384   Chronic systolic heart failure ([place]) 12/25/2011   Osteoporosis 03/14/2010   Anemia of chronic disease 10/01/2009   GERD (gastroesophageal reflux disease) 11/19/2008  ALKALINE PHOSPHATASE, ELEVATED 10/12/2008   VITAMIN D DEFICIENCY 01/19/2008   TRANSAMINASES, SERUM, ELEVATED 01/19/2008   Bacterial overgrowth syndrome 11/19/2004   Acquired short bowel syndrome 11/20/2003    Past Surgical History:  Procedure Laterality Date   APPENDECTOMY  yrs ago   CHOLECYSTECTOMY  yrs ago   COLONOSCOPY  12/05/2005   internal hemorrhoids (for polyp surveillance)   COLONOSCOPY  04/26/2012   Procedure: COLONOSCOPY;  Surgeon: Gatha Mayer, MD;  Location: [facility];  Service: Endoscopy;  Laterality: N/A;   COLONOSCOPY WITH PROPOFOL N/A 03/18/2016   Procedure: COLONOSCOPY WITH PROPOFOL;  Surgeon: Gatha Mayer, MD;  Location: [facility];   Service: Endoscopy;  Laterality: N/A;   ESOPHAGOGASTRODUODENOSCOPY  01/22/2009   erosive esophagitis   HARDWARE REMOVAL Left 12/31/2017   Procedure: HARDWARE REMOVAL;  Surgeon: Rod Can, MD;  Location: [facility];  Service: Orthopedics;  Laterality: Left;   INTRAMEDULLARY (IM) NAIL INTERTROCHANTERIC Left 12/31/2017   Procedure: INTRAMEDULLARY (IM) NAIL SUBTROCHANTRIC;  Surgeon: Rod Can, MD;  Location: [facility];  Service: Orthopedics;  Laterality: Left;   IR CV LINE INJECTION  03/23/2018   IR FLUORO GUIDE CV LINE LEFT  01/01/2018   IR FLUORO GUIDE CV LINE LEFT  03/24/2018   IR FLUORO GUIDE CV LINE LEFT  05/21/2018   IR FLUORO GUIDE CV LINE LEFT  07/09/2018   IR GENERIC HISTORICAL  07/04/2016   IR REMOVAL TUN CV CATH W/O FL 07/04/2016 Ascencion Dike, PA-C [facility]   IR GENERIC HISTORICAL  07/10/2016   IR US GUIDE VASC ACCESS LEFT 07/10/2016 Arne Cleveland, MD [facility]   IR GENERIC HISTORICAL  07/10/2016   IR FLUORO GUIDE CV LINE LEFT 07/10/2016 Arne Cleveland, MD [facility]   IR PATIENT EVAL TECH 0-60 MINS  10/07/2017   IR PATIENT EVAL TECH 0-60 MINS  03/09/2018   IR RADIOLOGIST EVAL & MGMT  07/21/2017   LEFT HEART CATH AND CORONARY ANGIOGRAPHY N/A 06/26/2020   Procedure: LEFT HEART CATH AND CORONARY ANGIOGRAPHY;  Surgeon: Jolaine Artist, MD;  Location: [facility];  Service: Cardiovascular;  Laterality: N/A;   ORIF PROXIMAL FEMORAL FRACTURE W/ ITST NAIL SYSTEM  03/2007   left, Dr. Shellia Carwin   SMALL INTESTINE SURGERY  2005   multiple with right colon resection for ischemia/infarct     OB History     Gravida  2   Para  2   Term      Preterm      AB      Living  1      SAB      IAB      Ectopic      Multiple      Live Births              Family History  Problem Relation Age of Onset   Diabetes Mother    Hypertension Mother    AAA (abdominal aortic aneurysm) Mother    Colon cancer Neg Hx    Stomach cancer Neg Hx     Social History    Tobacco Use   Smoking status: Former    Pack years: 0.00    Types: Cigarettes    Quit date: 05/12/2004    Years since quitting: 16.4   Smokeless tobacco: Never  Vaping Use   Vaping Use: Never used  Substance Use Topics   Alcohol use: No   Drug use: No    Home Medications Prior to Admission medications   Medication Sig Start  Date End Date Taking? Authorizing Provider  acetaminophen (TYLENOL) 500 MG tablet Take 1,000 mg by mouth daily as needed for mild pain.    [provider]  ADULT TPN Inject 1,800 mLs into the vein 4 (four) times a week. Pt receives home TPN from [facility]:  1800 mL bag, four nights weekly (Monday, Tuesday, Wednesday, Thursday for 8 hours (includes 1 hour taper up and down).    [provider]  baclofen (LIORESAL) 10 MG tablet Take 1 tablet (10 mg total) by mouth 2 (two) times daily as needed for muscle spasms. 08/29/20   Frann Rider, NP  Calcium Carb-Cholecalciferol (CALCIUM + D3 PO) Take 2 tablets by mouth daily.    [provider]  cetirizine (ZYRTEC) 10 MG tablet Take 10 mg by mouth daily as needed for allergies.    [provider]  clobetasol ointment (TEMOVATE) 4.49 % Apply 1 application topically 2 (two) times daily as needed. Patient not taking: Reported on 10/15/2020    [provider]  diphenhydrAMINE (BENADRYL) 25 mg capsule Take 25-50 mg by mouth every 6 (six) hours as needed for itching or sleep.     [provider]  fluticasone (FLONASE) 50 MCG/ACT nasal spray Place into both nostrils daily as needed for allergies or rhinitis.    [provider]  furosemide (LASIX) 20 MG tablet TAKE ONE TABLET BY MOUTH AS NEEDED FOR SWELLING 08/01/20   Bensimhon, Shaune Pascal, MD  Heparin Sodium, Porcine, (HEPARIN LOCK FLUSH IJ) Inject 5 mLs as directed 4 (four) times a week. Monday, Tuesday, Wednesday, Thursday in the morning with TPN    [provider]  Multiple Vitamins-Minerals (ADULT GUMMY PO) Take 2  tablets by mouth daily.    [provider]  omeprazole (PRILOSEC) 40 MG capsule Take 40 mg by mouth daily.    [provider]  ondansetron (ZOFRAN-ODT) 8 MG disintegrating tablet TAKE 1 TABLET (8 MG TOTAL) BY MOUTH EVERY 8 (EIGHT) HOURS AS NEEDED FOR NAUSEA OR VOMITING. 12/05/19   Gatha Mayer, MD  sacubitril-valsartan (ENTRESTO) 24-26 MG Take 1 tablet by mouth 2 (two) times daily. 05/09/20   Josue Hector, MD  sodium chloride 0.9 % infusion Inject 900 mLs into the vein every 14 (fourteen) days. Uses for TPN 10/27/17   [provider]  spironolactone (ALDACTONE) 25 MG tablet Take 1 tablet (25 mg total) by mouth daily. 07/04/20   Bensimhon, Shaune Pascal, MD  Teduglutide, rDNA, 5 MG KIT Inject 3.1 application into the skin daily in the afternoon.    [provider]  triamcinolone ointment (KENALOG) 0.1 % Apply 1 application topically 2 (two) times daily. 10/15/20 10/15/21  [name], MD  vitamin E 1000 UNIT capsule Take 1,000 Units by mouth daily.    [provider]  XARELTO 20 MG TABS tablet TAKE 1 TABLET (20 MG TOTAL) BY MOUTH DAILY WITH SUPPER. 02/17/20   [name], MD    Allergies    Penicillins  Review of Systems   Review of Systems  Constitutional:  Positive for appetite change, chills and diaphoresis. Negative for fever.  HENT:  Negative for ear pain and sore throat.   Eyes:  Negative for pain and visual disturbance.  Respiratory:  Positive for shortness of breath. Negative for cough.   Cardiovascular:  Negative for chest pain and palpitations.  Gastrointestinal:  Positive for nausea. Negative for abdominal pain and vomiting.  Genitourinary:  Negative for dysuria and hematuria.  Musculoskeletal:  Negative for  arthralgias and back pain.  Skin:  Negative for color change and rash.  Neurological:  Negative for seizures and syncope.  Psychiatric/Behavioral:  Positive for confusion.   All other systems reviewed and are  negative.  Physical Exam Updated Vital Signs BP (!) 117/47 (BP Location: Right Arm)   Pulse 98   Temp 97.7 F (36.5 C) (Oral)   Resp 18   LMP 02/26/2014   SpO2 100%   Physical Exam Vitals and nursing note reviewed. Exam conducted with a chaperone present.  Constitutional:      Appearance: Normal appearance. She is ill-appearing.     Comments: Chronically ill-appearing  HENT:     Head: Normocephalic and atraumatic.  Eyes:     General: No scleral icterus.       Right eye: No discharge.        Left eye: No discharge.     Extraocular Movements: Extraocular movements intact.     Pupils: Pupils are equal, round, and reactive to light.  Cardiovascular:     Rate and Rhythm: Normal rate and regular rhythm.     Pulses: Normal pulses.     Heart sounds: Normal heart sounds. No murmur heard.   No friction rub. No gallop.  Pulmonary:     Effort: Pulmonary effort is normal. No respiratory distress.     Breath sounds: Normal breath sounds.  Abdominal:     General: Abdomen is flat. Bowel sounds are normal. There is no distension.     Palpations: Abdomen is soft.     Tenderness: There is no abdominal tenderness.  Skin:    General: Skin is warm and dry.     Coloration: Skin is not jaundiced.     Comments: Central line is in place.  Surrounding area does not have any erythema or fluctuance.  No drainage is appreciated.  Neurological:     Mental Status: She is alert. Mental status is at baseline.     Coordination: Coordination normal.    ED Results / Procedures / Treatments   Labs (all labs ordered are listed, but only abnormal results are displayed) Labs Reviewed - No data to display  EKG None  Radiology No results found.  Procedures Procedures   Medications Ordered in ED Medications - No data to display  ED Course  I have reviewed the triage vital signs and the nursing notes.  Pertinent labs & imaging results that were available during my care of the patient were  reviewed by me and considered in my medical decision making (see chart for details).  Clinical Course as of 10/27/20 1359  Sat Oct 27, 2020  1014 Urinalysis, Routine w reflex microscopic Urine, Clean Catch No UTI [HS]  1034 Patient refused IV fluids for now saying she is worried that the infection will circulate. I explained this was not the case, but she is requesting that we hold off on fluids until she has the central line removed.  [HS]  1053 Hemoglobin(!): 8.6 At baseline [HS]  1054 Comprehensive metabolic panel(!) Impaired kidney funciton, but this is baseline for her. Not AKI. [HS]    Clinical Course User Index [HS] Sherrill Raring, PA-C   MDM Rules/Calculators/A&P                          Patient is a 70 year old female presenting for chills, night sweats, possible central line infection.  She is stable on exam, but soft blood pressure and almost tachycardic at 98.  Given that she was sent here by her GI doctor, I believe she will need to be admitted.  I will start off with basic labs, treating her nausea and giving her fluids.  Once results start coming and I will consult with infectious disease.  Dr. Celesta Aver note -  "Spoke to Devanie. She will need to go to hospital in AM - to ED. Is going to need central line removed and tip cultured and peripheral cultures and be admitted while cultures are processed. Then if cultures neg can get another line. May need ID consult to help. Not a GI issue per se but she is my patient and I know her as well as anybody so if ?'s call me [phone] - she has my # also"  I spoke with Dr. Olevia Bowens who is happy to admit the patient.  I also started her on antibiotics and fluids.  Final Clinical Impression(s) / ED Diagnoses Final diagnoses:  None    Rx / DC Orders ED Discharge Orders     None        Sherrill Raring, PA-C 10/27/20 1400    Milton Ferguson, MD 10/29/20 1002

## 2020-10-27 NOTE — Procedures (Addendum)
  Procedure: Removal tunneled L IJ CVC tip send for cx per Carlean Purl EBL:   minimal Complications:  none immediate  Procedure performed bedside. No imaging utilized. No radiology dictation in [place].  Dillard Cannon MD Main # [phone] Pager  [phone]

## 2020-10-27 NOTE — Consult Note (Signed)
[facility]                                                                                        Patient Identification: Patient Name: [name] [name] MRN: [id number] Admit Date: 10/27/2020  8:27 AM Today's Date: 10/27/2020 Reason for consult:  Requesting provider:   Principal Problem:   Bacteremia Active Problems:   Acquired short bowel syndrome   GERD (gastroesophageal reflux disease)   Chronic systolic heart failure (HCC)   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HCC)   Hypotension   Thrombocytopenia (HCC)   CKD (chronic kidney disease), stage III (HCC)   Macrocytic anemia   Antibiotics: Vancomycin/cefepime/metronidazole 6/18  Lines/Tubes: PIV's  Assessment Chills and night sweats with concerns of central line infection, status post removal on 6/18 UA not indicative of infection No respiratory symptoms  Short Gut syndrome with TPN dependence   history of several clabsi s/p line removal/exchanged  Recommendations  Continue vancomycin and cefepime, DC metronidazole Follow-up blood cultures and central line tip cultures Monitor CBC BMP and vancomycin trough Following   Rest of the management as per the primary team. Please call with questions or concerns.  Thank you for the consult  Rosiland Oz, MD Infectious Disease Physician [facility] [address] [place], [place] [address] Phone: [phone]  Fax: [phone]  __________________________________________________________________________________________________________ HPI and Hospital Course: 70 year old female with past medical history of multiple comorbidities including short gut syndrome status post small bowel resection and on TPN with history of several clabsi s/p line removal/exchanged with left chest port who came to the ED on 618 with complaints of chills with  infusion of her TPN for approximately a week prior to ED presentation.  Night sweats for the same duration along with nausea.  Denies any fevers, mild shortness of breath.  Denies any cough, chest pain, abdominal pain.  Has history of chronic diarrhea with no recent changes.  Denies any GU symptoms.  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  She followed up with her GI doctor and she was advised to come to the ED to have the central line removed and the tip cultured.   At ED she was afebrile, no leukocytosis, blood cultures were drawn.  Left-sided chest port was removed and tip was sent for culture.  She was started on broad-spectrum antibiotics with bank cefepime and metronidazole.    ROS: General- Denies fever, loss of appetite and loss of weight HEENT - Denies headache, blurry vision, neck pain, sinus pain Chest - Denies any chest pain, SOB or cough CVS- Denies any dizziness/lightheadedness, syncopal attacks, palpitations Abdomen- Denies any nausea, vomiting, abdominal pain, hematochezia and diarrhea Neuro - Denies any weakness, numbness, tingling sensation Psych - Denies any changes in mood irritability or depressive symptoms GU- Denies any burning, dysuria, hematuria or increased frequency of urination Skin - denies any rashes/lesions MSK - denies any joint pain/swelling or restricted ROM   Past Medical History:  Diagnosis Date   Abnormal LFTs  2013   Allergic rhinosinusitis    Anemia of chronic disease    At risk for dental problems    Atypical nevus    Bacteremia due to Klebsiella pneumoniae 12/12/2011   Bacterial overgrowth syndrome    Brachial vein thrombus, 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   Carotid stenosis    Carotid US (9/15):  R 40-59%; L 1-39% >> FU 1 year   Closed left subtrochanteric femur fracture (HCC) 12/31/2017   Congestive  heart failure (CHF) (HCC)    EF 25-30%   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   Fracture of left clavicle    History of blood transfusion 2013   anemia   Hx of cardiovascular stress test    Myoview (9/15):  inf-apical scar; no ischemia; EF 47% - low risk    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 12/12/2011   Osteoporosis    Pancytopenia 10/07/2011   Pathologic fracture of neck of femur ([place])    Personal history of colonic polyps    Renal insufficiency    hx of yrs ago   Serratia marcescens infection - bactermia assoc w/ PICC 01/18/2015   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HCC)    Splenomegaly    By ultrasound   Stroke ([place]) 07/2020   Thrombophilia ([place])    Vitamin D deficiency    Past Surgical History:  Procedure Laterality Date   APPENDECTOMY  yrs ago   CHOLECYSTECTOMY  yrs ago   COLONOSCOPY  12/05/2005   internal hemorrhoids (for polyp surveillance)   COLONOSCOPY  04/26/2012   Procedure: COLONOSCOPY;  Surgeon: Gatha Mayer, MD;  Location: [facility];  Service: Endoscopy;  Laterality: N/A;   COLONOSCOPY WITH PROPOFOL N/A 03/18/2016   Procedure: COLONOSCOPY WITH PROPOFOL;  Surgeon: Gatha Mayer, MD;  Location: [facility];  Service: Endoscopy;  Laterality: N/A;   ESOPHAGOGASTRODUODENOSCOPY  01/22/2009   erosive esophagitis   HARDWARE REMOVAL Left 12/31/2017   Procedure: HARDWARE REMOVAL;  Surgeon: Rod Can, MD;  Location: [facility];  Service: Orthopedics;  Laterality: Left;   INTRAMEDULLARY (IM) NAIL INTERTROCHANTERIC Left 12/31/2017   Procedure: INTRAMEDULLARY (IM) NAIL SUBTROCHANTRIC;  Surgeon: Rod Can, MD;  Location: [facility];  Service: Orthopedics;  Laterality: Left;   IR CV LINE INJECTION  03/23/2018   IR FLUORO GUIDE CV LINE LEFT  01/01/2018   IR FLUORO GUIDE CV LINE LEFT  03/24/2018   IR FLUORO GUIDE CV LINE LEFT  05/21/2018   IR FLUORO GUIDE CV LINE LEFT  07/09/2018   IR GENERIC HISTORICAL  07/04/2016   IR  REMOVAL TUN CV CATH W/O FL 07/04/2016 Ascencion Dike, PA-C [facility]   IR GENERIC HISTORICAL  07/10/2016   IR US GUIDE VASC ACCESS LEFT 07/10/2016 Arne Cleveland, MD [facility]   IR GENERIC HISTORICAL  07/10/2016   IR FLUORO GUIDE CV LINE LEFT 07/10/2016 Arne Cleveland, MD [facility]   IR PATIENT EVAL TECH 0-60 MINS  10/07/2017   IR PATIENT EVAL TECH 0-60 MINS  03/09/2018   IR RADIOLOGIST EVAL & MGMT  07/21/2017   LEFT HEART CATH AND CORONARY ANGIOGRAPHY N/A 06/26/2020   Procedure: LEFT HEART CATH AND CORONARY ANGIOGRAPHY;  Surgeon: Jolaine Artist, MD;  Location: [facility];  Service: Cardiovascular;  Laterality: N/A;   ORIF PROXIMAL FEMORAL FRACTURE W/ ITST NAIL SYSTEM  03/2007   left, Dr. Shellia Carwin   SMALL INTESTINE SURGERY  2005  multiple with right colon resection for ischemia/infarct     Scheduled Meds:  [START ON 10/28/2020] calcium-vitamin D  1 tablet Oral Q breakfast   [START ON 10/28/2020] pantoprazole  40 mg Oral Daily   rivaroxaban  20 mg Oral Q supper   sacubitril-valsartan  1 tablet Oral BID   [START ON 10/28/2020] spironolactone  25 mg Oral Daily   [START ON 10/28/2020] Teduglutide (rDNA)  3.1 application Subcutaneous q morning   triamcinolone ointment  1 application Topical BID   Continuous Infusions:  [START ON 10/28/2020] vancomycin     PRN Meds:.acetaminophen **OR** acetaminophen, diphenhydrAMINE, fluticasone, furosemide, loratadine, ondansetron **OR** ondansetron (ZOFRAN) IV  Allergies  Allergen Reactions   Penicillins Itching, Swelling and Other (See Comments)    Reaction:  Facial swelling Has patient had a PCN reaction causing immediate rash, facial/tongue/throat swelling, SOB or lightheadedness with hypotension: Yes Has patient had a PCN reaction causing severe rash involving mucus membranes or skin necrosis: No Has patient had a PCN reaction that required hospitalization No Has patient had a PCN reaction occurring within the last 10 years: No If all  of the above answers are "NO", then may proceed with Cephalosporin use.   Social History   Socioeconomic History   Marital status: Married    Spouse name: Not on file   Number of children: Not on file   Years of education: Not on file   Highest education level: Not on file  Occupational History   Not on file  Tobacco Use   Smoking status: Former    Pack years: 0.00    Types: Cigarettes    Quit date: 05/12/2004    Years since quitting: 16.4   Smokeless tobacco: Never  Vaping Use   Vaping Use: Never used  Substance and Sexual Activity   Alcohol use: No   Drug use: No   Sexual activity: Yes    Comment: 1st intercourse 52 yo-5 partners  Other Topics Concern   Not on file  Social History Narrative   Married to [place], has 1 daughter and a granddaughter the patient's son died in childhood due to a motor vehicle wreck   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   No alcohol tobacco or drug use   04/07/2017   Social Determinants of Health   Financial Resource Strain: Not on file  Food Insecurity: Not on file  Transportation Needs: No Transportation Needs   Lack of Transportation (Medical): No   Lack of Transportation (Non-Medical): No  Physical Activity: Not on file  Stress: Not on file  Social Connections: Not on file  Intimate Partner Violence: Not on file   Family h/o denies   Vitals BP (!) 100/52 (BP Location: Right Arm)   Pulse (!) 46   Temp 97.9 F (36.6 C) (Oral)   Resp 14   Ht 5\' 8"  (1.727 m)   Wt 64.5 kg   LMP 02/26/2014   SpO2 99%   BMI 21.62 kg/m    Physical Exam Constitutional: Not in acute distress and appears comfortable    Comments:   Cardiovascular:     Rate and Rhythm: Normal rate and regular rhythm.     Heart sounds: S1-S2, no murmur  Pulmonary:     Effort: Pulmonary effort is normal.     Comments: Clear air entry bilaterally  Abdominal:     Palpations: Abdomen is soft.     Tenderness:  Nondistended  Musculoskeletal:  General: No swelling or tenderness.   Skin:    Comments: No lesions or rashes  Neurological:     General: No focal deficit present.   Psychiatric:        Mood and Affect: Mood normal.    Pertinent Microbiology Results for orders placed or performed during the hospital encounter of 10/27/20  Culture, blood (routine x 2)     Status: None (Preliminary result)   Collection Time: 10/27/20  9:47 AM   Specimen: BLOOD  Result Value Ref Range Status   Specimen Description   Final    BLOOD RIGHT ANTECUBITAL Performed at [facility] [address] [place], [place] [address]    Special Requests   Final    BOTTLES DRAWN AEROBIC AND ANAEROBIC Blood Culture results may not be optimal due to an excessive volume of blood received in culture bottles Performed at [facility] [address], [place], [place] [address]    Culture   Final    NO GROWTH < 24 HOURS Performed at [facility] [address] [place],  [address]    Report Status PENDING  Incomplete  Resp Panel by RT-PCR (Flu A&B, Covid) Nasopharyngeal Swab     Status: None   Collection Time: 10/27/20  9:47 AM   Specimen: Nasopharyngeal Swab; Nasopharyngeal(NP) swabs in vial transport medium  Result Value Ref Range Status   SARS Coronavirus 2 by RT PCR NEGATIVE NEGATIVE Final    Comment: (NOTE) SARS-CoV-2 target nucleic acids are NOT DETECTED.  The SARS-CoV-2 RNA is generally detectable in upper respiratory specimens during the acute phase of infection. The lowest concentration of SARS-CoV-2 viral copies this assay can detect is 138 copies/mL. A negative result does not preclude SARS-Cov-2 infection and should not be used as the sole basis for treatment or other patient management decisions. A negative result may occur with  improper specimen collection/handling, submission of specimen other than nasopharyngeal swab, presence of viral  mutation(s) within the areas targeted by this assay, and inadequate number of viral copies(<138 copies/mL). A negative result must be combined with clinical observations, patient history, and epidemiological information. The expected result is Negative.  Fact Sheet for Patients:  EntrepreneurPulse.com.au  Fact Sheet for Healthcare Providers:  IncredibleEmployment.be  This test is no t yet approved or cleared by the [place] FDA and  has been authorized for detection and/or diagnosis of SARS-CoV-2 by FDA under an Emergency Use Authorization (EUA). This EUA will remain  in effect (meaning this test can be used) for the duration of the COVID-19 declaration under Section 564(b)(1) of the Act, 21 U.S.C.section 360bbb-3(b)(1), unless the authorization is terminated  or revoked sooner.       Influenza A by PCR NEGATIVE NEGATIVE Final   Influenza B by PCR NEGATIVE NEGATIVE Final    Comment: (NOTE) The Xpert Xpress SARS-CoV-2/FLU/RSV plus assay is intended as an aid in the diagnosis of influenza from Nasopharyngeal swab specimens and should not be used as a sole basis for treatment. Nasal washings and aspirates are unacceptable for Xpert Xpress SARS-CoV-2/FLU/RSV testing.  Fact Sheet for Patients: EntrepreneurPulse.com.au  Fact Sheet for Healthcare Providers: IncredibleEmployment.be  This test is not yet approved or cleared by the [place] FDA and has been authorized for detection and/or diagnosis of SARS-CoV-2 by FDA under an Emergency Use Authorization (EUA). This EUA will remain in effect (meaning this test can be used) for the duration of the COVID-19 declaration under Section 564(b)(1) of the Act,  21 U.S.C. section 360bbb-3(b)(1), unless the authorization is terminated or revoked.  Performed at [facility], [place] [address] [place], [place] [address]   Culture, blood (routine  x 2)     Status: None (Preliminary result)   Collection Time: 10/27/20 11:26 AM   Specimen: BLOOD  Result Value Ref Range Status   Specimen Description   Final    BLOOD BLOOD LEFT HAND Performed at [facility] [address], [place], [place] [address]    Special Requests   Final    BOTTLES DRAWN AEROBIC AND ANAEROBIC Blood Culture adequate volume Performed at [facility] [address], [place], [place] [address]    Culture   Final    NO GROWTH < 24 HOURS Performed at [facility] [address], [place], [place] [address]    Report Status PENDING  Incomplete   *Note: Due to a large number of results and/or encounters for the requested time period, some results have not been displayed. A complete set of results can be found in Results Review.    Pertinent Lab seen by me: CBC Latest Ref Rng & Units 10/28/2020 10/27/2020 10/22/2020  WBC 4.0 - 10.5 K/uL 4.6 7.0 4.9  Hemoglobin 12.0 - 15.0 g/dL 8.0(L) 8.6(L) 8.5(L)  Hematocrit 36.0 - 46.0 % 26.2(L) 28.0(L) 27.1(L)  Platelets 150 - 400 K/uL 62(L) 68(L) 92(L)   CMP Latest Ref Rng & Units 10/28/2020 10/27/2020 10/10/2020  Glucose 70 - 99 mg/dL 74 91 105(H)  BUN 8 - 23 mg/dL 23 31(H) 25(H)  Creatinine 0.44 - 1.00 mg/dL 1.31(H) 1.20(H) 1.05  Sodium 135 - 145 mmol/L 137 136 138  Potassium 3.5 - 5.1 mmol/L 3.4(L) 3.5 4.0  Chloride 98 - 111 mmol/L 113(H) 108 111  CO2 22 - 32 mmol/L 18(L) 22 21  Calcium 8.9 - 10.3 mg/dL 7.8(L) 8.1(L) 7.9(L)  Total Protein 6.5 - 8.1 g/dL 5.9(L) 6.9 6.5  Total Bilirubin 0.3 - 1.2 mg/dL 0.3 0.6 0.5  Alkaline Phos 38 - 126 U/L 77 89 98  AST 15 - 41 U/L 18 19 14   ALT 0 - 44 U/L 16 16 13      Pertinent Imagings/Other Imagings Plain films and CT images have been personally visualized and interpreted; radiology reports have been reviewed. Decision making incorporated into the Impression / Recommendations.   I have spent more than 70  minutes for this patient  encounter including review of prior medical records with greater than 50% of time being face to face and coordination of their care.  Electronically signed by:   Rosiland Oz, MD Infectious Disease Physician [facility] Infectious Disease Pager: [phone]

## 2020-10-27 NOTE — Progress Notes (Signed)
Received IV consult r/t removal of left chest central line with cath tip culture. Noted this central line per patient record is cuffed and radiology has to remove it. Primary RN [place] aware.

## 2020-10-27 NOTE — Progress Notes (Signed)
Pharmacy Antibiotic Note  [name] [name] is a 70 y.o. female with short bowel syndrome on TPN PTA presented to the ED on 10/27/2020 with c/o chills and night sweats. Pharmacy has been consulted to dose vancomycin for suspected line infection.  - 6/18: removal of tunneled L IJ CVC  - scr 1.20 (crcl~44)  Plan: - vancomycin 1250 mg IV x1, then 1000 mg IV q24h for est AUC 522  ___________________________________________ Height: 5\' 8"  (172.7 cm) Weight: 64.5 kg (142 lb 3.2 oz) IBW/kg (Calculated) : 63.9  Temp (24hrs), Avg:98.5 F (36.9 C), Min:97.7 F (36.5 C), Max:99.3 F (37.4 C)  Recent Labs  Lab 10/22/20 1351 10/27/20 0947 10/27/20 1126  WBC 4.9 7.0  --   CREATININE  --  1.20*  --   LATICACIDVEN  --  0.9 1.5    Estimated Creatinine Clearance: 44 mL/min (A) (by C-G formula based on SCr of 1.2 mg/dL (H)).    Allergies  Allergen Reactions   Penicillins Itching, Swelling and Other (See Comments)    Reaction:  Facial swelling Has patient had a PCN reaction causing immediate rash, facial/tongue/throat swelling, SOB or lightheadedness with hypotension: Yes Has patient had a PCN reaction causing severe rash involving mucus membranes or skin necrosis: No Has patient had a PCN reaction that required hospitalization No Has patient had a PCN reaction occurring within the last 10 years: No If all of the above answers are "NO", then may proceed with Cephalosporin use.     Thank you for allowing pharmacy to be a part of this patient's care.  Lynelle Doctor 10/27/2020 4:02 PM

## 2020-10-27 NOTE — ED Triage Notes (Signed)
Pt reports chills and night sweats. She has a cental line. She was recommended to come to ED by GI to be admitted and central line removed and tip cultured, get peripheral cultures, get new central line if negative.

## 2020-10-28 DIAGNOSIS — N1831 Chronic kidney disease, stage 3a: Secondary | ICD-10-CM | POA: Diagnosis not present

## 2020-10-28 DIAGNOSIS — K912 Postsurgical malabsorption, not elsewhere classified: Secondary | ICD-10-CM | POA: Diagnosis not present

## 2020-10-28 DIAGNOSIS — I5022 Chronic systolic (congestive) heart failure: Secondary | ICD-10-CM | POA: Diagnosis not present

## 2020-10-28 DIAGNOSIS — D539 Nutritional anemia, unspecified: Secondary | ICD-10-CM | POA: Diagnosis not present

## 2020-10-28 DIAGNOSIS — D696 Thrombocytopenia, unspecified: Secondary | ICD-10-CM | POA: Diagnosis not present

## 2020-10-28 DIAGNOSIS — R7881 Bacteremia: Secondary | ICD-10-CM

## 2020-10-28 LAB — CBC WITH DIFFERENTIAL/PLATELET
Abs Immature Granulocytes: 0.01 10*3/uL (ref 0.00–0.07)
Basophils Absolute: 0.1 10*3/uL (ref 0.0–0.1)
Basophils Relative: 1 %
Eosinophils Absolute: 0.3 10*3/uL (ref 0.0–0.5)
Eosinophils Relative: 6 %
HCT: 26.2 % — ABNORMAL LOW (ref 36.0–46.0)
Hemoglobin: 8 g/dL — ABNORMAL LOW (ref 12.0–15.0)
Immature Granulocytes: 0 %
Lymphocytes Relative: 35 %
Lymphs Abs: 1.6 10*3/uL (ref 0.7–4.0)
MCH: 31.1 pg (ref 26.0–34.0)
MCHC: 30.5 g/dL (ref 30.0–36.0)
MCV: 101.9 fL — ABNORMAL HIGH (ref 80.0–100.0)
Monocytes Absolute: 0.3 10*3/uL (ref 0.1–1.0)
Monocytes Relative: 7 %
Neutro Abs: 2.3 10*3/uL (ref 1.7–7.7)
Neutrophils Relative %: 51 %
Platelets: 62 10*3/uL — ABNORMAL LOW (ref 150–400)
RBC: 2.57 MIL/uL — ABNORMAL LOW (ref 3.87–5.11)
RDW: 14.7 % (ref 11.5–15.5)
WBC: 4.6 10*3/uL (ref 4.0–10.5)
nRBC: 0 % (ref 0.0–0.2)

## 2020-10-28 LAB — COMPREHENSIVE METABOLIC PANEL
ALT: 16 U/L (ref 0–44)
AST: 18 U/L (ref 15–41)
Albumin: 2.3 g/dL — ABNORMAL LOW (ref 3.5–5.0)
Alkaline Phosphatase: 77 U/L (ref 38–126)
Anion gap: 6 (ref 5–15)
BUN: 23 mg/dL (ref 8–23)
CO2: 18 mmol/L — ABNORMAL LOW (ref 22–32)
Calcium: 7.8 mg/dL — ABNORMAL LOW (ref 8.9–10.3)
Chloride: 113 mmol/L — ABNORMAL HIGH (ref 98–111)
Creatinine, Ser: 1.31 mg/dL — ABNORMAL HIGH (ref 0.44–1.00)
GFR, Estimated: 44 mL/min — ABNORMAL LOW (ref >60)
Glucose, Bld: 74 mg/dL (ref 70–99)
Potassium: 3.4 mmol/L — ABNORMAL LOW (ref 3.5–5.1)
Sodium: 137 mmol/L (ref 135–145)
Total Bilirubin: 0.3 mg/dL (ref 0.3–1.2)
Total Protein: 5.9 g/dL — ABNORMAL LOW (ref 6.5–8.1)

## 2020-10-28 LAB — URINE CULTURE

## 2020-10-28 LAB — TRIGLYCERIDES: Triglycerides: 146 mg/dL (ref <150)

## 2020-10-28 LAB — PREALBUMIN: Prealbumin: 10.2 mg/dL — ABNORMAL LOW (ref 18–38)

## 2020-10-28 LAB — PHOSPHORUS: Phosphorus: 3.9 mg/dL (ref 2.5–4.6)

## 2020-10-28 LAB — MAGNESIUM: Magnesium: 1.9 mg/dL (ref 1.7–2.4)

## 2020-10-28 MED ORDER — PROSOURCE PLUS PO LIQD
30.0000 mL | Freq: Three times a day (TID) | ORAL | Status: DC
  Administered 2020-10-28 – 2020-10-29 (×3): 30 mL via ORAL
  Filled 2020-10-28 (×7): qty 30

## 2020-10-28 MED ORDER — POTASSIUM CHLORIDE CRYS ER 20 MEQ PO TBCR
40.0000 meq | EXTENDED_RELEASE_TABLET | Freq: Once | ORAL | Status: AC
  Administered 2020-10-28: 40 meq via ORAL
  Filled 2020-10-28: qty 2

## 2020-10-28 MED ORDER — ADULT MULTIVITAMIN W/MINERALS CH
1.0000 | ORAL_TABLET | Freq: Every day | ORAL | Status: DC
  Administered 2020-10-28 – 2020-11-01 (×5): 1 via ORAL
  Filled 2020-10-28 (×5): qty 1

## 2020-10-28 NOTE — Progress Notes (Signed)
Initial Nutrition Assessment RD working remotely.  DOCUMENTATION CODES:   Not applicable  INTERVENTION:  - TPN initiation when medically feasible. - will order 30 ml Prosource Plus TID, each supplement provides 100 kcal and 15 grams protein.  - will order 1 tablet multivitamin with minerals/day. - complete NFPE when feasible.    NUTRITION DIAGNOSIS:   Increased nutrient needs related to acute illness as evidenced by estimated needs.  GOAL:   Patient will meet greater than or equal to 90% of their needs  MONITOR:   PO intake, Supplement acceptance, Labs, Weight trends  REASON FOR ASSESSMENT:   Consult New TPN/TNA  ASSESSMENT:   70 year old female with medical history of short gut syndrome on chronic TPN, bacterial overgrowth syndrome, PICC line related bacteremia, chronic normocytic anemia, allergic rhinosinusitis, brachial vein thrombosis, carnitine deficiency, carotid stenosis, CHF, osteoporosis, pancytopenia, CVA, thrombophilia, vitamin D deficiency, and hx of multiple long-term catheter placements. She was referred to the ED by her Gastroenterologist on 6/18 d/t chills, sweats, and subjective fevers; concern for bacteremia. Tunneled L IJ CVC was removed by IR on 10/27/20.  She ate 75% of breakfast this AM. No central line access at this time; await placement for TPN resumption.   She has not been seen by a [place] RD since 12/2017.   Yesterday weight was 142 lb and weight has been stable for at least the past 4 months. Mild pitting edema to BLE documented in the edema section of flow sheet.    Labs reviewed; K: 3.4 mmol/l, Cl: 113 mmol/l, creatinine: 1.31 mg/dl, Ca: 7.8 mg/dl, GFR: 44 ml/min. Medications reviewed; 1 tablet oscal with vitamin D once/day, 40 mg oral protonix/day, 40 mEq Klor-Con x1 dose 6/19, 25 mg aldactone/day.    NUTRITION - FOCUSED PHYSICAL EXAM:  Unable to complete at this time.  Diet Order:   Diet Order             Diet Heart Room  service appropriate? Yes; Fluid consistency: Thin  Diet effective now                   EDUCATION NEEDS:   No education needs have been identified at this time  Skin:  Skin Assessment: Reviewed RN Assessment  Last BM:  6/19  Height:   Ht Readings from Last 1 Encounters:  10/27/20 5\' 8"  (1.727 m)    Weight:   Wt Readings from Last 1 Encounters:  10/27/20 64.5 kg      Estimated Nutritional Needs:  Kcal:  1900-2100 kcal Protein:  95-110 grams Fluid:  >/= 2.2 L/day      Jarome Matin, MS, RD, LDN, CNSC Inpatient Clinical Dietitian RD pager # available in [facility]  After hours/weekend pager # available in [facility]

## 2020-10-28 NOTE — Progress Notes (Signed)
Patient ID: [name] [name], female   DOB: Shemeika 15, 1952, 70 y.o.   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  On presentation, she was afebrile with stable hemodynamics.  WBC of 7, platelets of 68, creatinine of 1.2.  She was empirically started on antibiotics.  ID was consulted.  She underwent removal of tunneled left IJ CVC by IR on 10/27/2020.  Assessment & Plan:   Subjective fever with chills and sweats with concern for bacteremia in a patient with history of multiple long-term catheter placements -Cultures negative so far.  Patient received a dose of cefepime and Flagyl in the ED and is currently on vancomycin.  Admitting provider spoke to ID on admission: Follow recommendations -Status post removal of tunneled left IJ CVC by IR on 10/27/2020.  Follow cultures.  Negative so far. -If cultures remain negative, might have to consider line replacement on Monday or Tuesday depending on recommendations from ID.  Short gut syndrome on TPN -Resume TPN once line is replaced.    Chronic systolic heart failure next-compensated.  Continue Entresto, spironolactone.  Outpatient follow-up with cardiology.  Continue Lasix as needed  Hypercoagulation syndrome History of unspecified CVA in March of this year -Continue Xarelto  Hypotension -Blood pressure on the lower side  but stable.  Monitor  Thrombocytopenia -Chronic.  No signs of bleeding.  Monitor  CKD stage III -Creatinine stable.  Monitor  Anemia of chronic disease/macrocytic anemia -Hemoglobin stable  GERD -Continue Protonix  Generalized deconditioning -PT eval in a.m.   DVT prophylaxis: Xarelto Code Status: Full Family Communication: None Disposition Plan: Status is: Observation  The patient will require care spanning > 2 midnights and should be moved to inpatient because: Inpatient level of care appropriate due to severity of illness  Dispo: The patient is from: Home              Anticipated d/c is to: Home              Patient currently is not medically stable to d/c.   Difficult to place patient No  Consultants: IR/ID  Procedures: tunneled left IJ CVC by IR on 10/27/2020  Antimicrobials:  Anti-infectives (From admission, onward)    Start     Dose/Rate Route Frequency Ordered Stop   10/28/20 1600  vancomycin (VANCOREADY) IVPB 1000 mg/200 mL        1,000 mg 200 mL/hr over 60 Minutes Intravenous Every 24 hours 10/27/20 1608     10/27/20 1700  vancomycin (VANCOREADY) IVPB 1250 mg/250 mL        1,250 mg 166.7 mL/hr over 90 Minutes Intravenous  Once 10/27/20 1608 10/27/20 1819   10/27/20 1200  ceFEPIme (MAXIPIME) 2 g in sodium chloride 0.9 % 100 mL IVPB       See Hyperspace for full Linked Orders Report.   2 g 200 mL/hr over 30 Minutes Intravenous  Once 10/27/20 1156 10/27/20 1257   10/27/20 1200  metroNIDAZOLE (FLAGYL) IVPB 500 mg  See Hyperspace for full Linked Orders Report.   500 mg 100 mL/hr over 60 Minutes Intravenous  Once 10/27/20 1156 10/27/20 1411        Subjective: Patient seen and examined at bedside.  Denies worsening fever, nausea, vomiting or shortness of breath.  Objective: Vitals:   10/27/20 1819 10/27/20 2118 10/28/20 0240 10/28/20 0640  BP: (!) 94/51 (!) 96/46 (!) 90/58 (!) 100/52  Pulse: (!) 50 (!) 51 (!) 51 (!) 46  Resp: 18 18 16 14    Temp: 97.9 F (36.6 C) 98.1 F (36.7 C) 98.1 F (36.7 C) 97.9 F (36.6 C)  TempSrc:  Oral Oral Oral  SpO2: 100% 98% 98% 99%  Weight:      Height:        Intake/Output Summary (Last 24 hours) at 10/28/2020 1145 Last data filed at 10/27/2020 1257 Gross per 24 hour  Intake 100 ml  Output --  Net 100 ml   Filed Weights   10/27/20 1446  Weight: 64.5 kg    Examination:  General exam: Appears calm and comfortable.  Looks chronically ill.  Currently on room air. Respiratory system: Bilateral decreased breath sounds at bases Cardiovascular system: S1 & S2 heard, intermittently bradycardic Gastrointestinal system: Abdomen is nondistended, soft and nontender. Normal bowel sounds heard. Extremities: No cyanosis, clubbing, edema  Central nervous system: Alert and oriented. No focal neurological deficits. Moving extremities Skin: No rashes, lesions or ulcers Psychiatry: Judgement and insight appear normal. Mood & affect appropriate.     Data Reviewed: I have personally reviewed following labs and imaging studies  CBC: Recent Labs  Lab 10/22/20 1351 10/27/20 0947 10/28/20 0447  WBC 4.9 7.0 4.6  NEUTROABS 2.1 4.3 2.3  HGB 8.5* 8.6* 8.0*  HCT 27.1* 28.0* 26.2*  MCV 98.5 101.1* 101.9*  PLT 92* 68* 62*   Basic Metabolic Panel: Recent Labs  Lab 10/27/20 0947 10/28/20 0447  NA 136 137  K 3.5 3.4*  CL 108 113*  CO2 22 18*  GLUCOSE 91 74  BUN 31* 23  CREATININE 1.20* 1.31*  CALCIUM 8.1* 7.8*  MG  --  1.9  PHOS  --  3.9   GFR: Estimated Creatinine Clearance: 40.3 mL/min (A) (by C-G formula based on SCr of 1.31 mg/dL (H)). Liver Function Tests: Recent Labs  Lab 10/27/20 0947 10/28/20 0447  AST 19 18  ALT 16 16  ALKPHOS 89 77  BILITOT 0.6 0.3  PROT 6.9 5.9*  ALBUMIN 2.8* 2.3*   No results for input(s): LIPASE, AMYLASE in the last 168 hours. No results for input(s): AMMONIA in the last 168 hours. Coagulation Profile: No results for input(s): INR, PROTIME in  the last 168 hours. Cardiac Enzymes: No results for input(s): CKTOTAL, CKMB, CKMBINDEX, TROPONINI in the last 168 hours. BNP (last 3 results) No results for input(s): PROBNP in the last 8760 hours. HbA1C: No results for input(s): HGBA1C in the last 72 hours. CBG: No results for input(s): GLUCAP in the last 168 hours. Lipid Profile: Recent Labs    10/28/20 0447  TRIG 146   Thyroid Function Tests: No results for input(s): TSH, T4TOTAL, FREET4, T3FREE, THYROIDAB in the last 72 hours. Anemia Panel: No results for input(s): VITAMINB12, FOLATE, FERRITIN, TIBC, IRON, RETICCTPCT in the last 72 hours. Sepsis Labs: Recent Labs  Lab 10/27/20 0947 10/27/20 1126  LATICACIDVEN 0.9 1.5    Recent Results (from the past 240 hour(s))  Culture, Blood     Status: None   Collection Time: 10/22/20  1:51 PM  Specimen: BLOOD  Result Value Ref Range Status   Specimen Description   Final    BLOOD Performed at [facility], [place] [address], [place], [place] [address]    Special Requests   Final    NONE Performed at [facility], [place] [address] [place], [place] [address]    Culture   Final    NO GROWTH 5 DAYS Performed at [facility], [place] [address], [place], [place] [address]    Report Status 10/27/2020 FINAL  Final  Culture, blood (routine x 2)     Status: None (Preliminary result)   Collection Time: 10/27/20  9:47 AM   Specimen: BLOOD  Result Value Ref Range Status   Specimen Description   Final    BLOOD RIGHT ANTECUBITAL Performed at [place] [address], [place], [place] [address]    Special Requests   Final    BOTTLES DRAWN AEROBIC AND ANAEROBIC Blood Culture results may not be optimal due to an excessive volume of blood received in culture bottles Performed at [place] [address], [name], [place] [address]    Culture   Final    NO GROWTH < 24 HOURS Performed at [place] [address] [place],  [address]    Report Status PENDING  Incomplete  Resp Panel by RT-PCR (Flu A&B, Covid) Nasopharyngeal Swab     Status: None   Collection Time: 10/27/20  9:47 AM   Specimen: Nasopharyngeal Swab; Nasopharyngeal(NP) swabs in vial transport medium  Result Value Ref Range Status   SARS Coronavirus 2 by RT PCR NEGATIVE NEGATIVE Final    Comment: (NOTE) SARS-CoV-2 target nucleic acids are NOT DETECTED.  The SARS-CoV-2 RNA is generally detectable in upper respiratory specimens during the acute phase of infection. The lowest concentration of SARS-CoV-2 viral copies this assay can detect is 138 copies/mL. A negative result does not preclude SARS-Cov-2 infection and should not be used as the sole basis for treatment or other patient management decisions. A negative result may occur with  improper specimen collection/handling, submission of specimen other than nasopharyngeal swab, presence of viral mutation(s) within the areas targeted by this assay, and inadequate number of viral copies(<138 copies/mL). A negative result must be combined with clinical observations, patient history, and epidemiological information. The expected result is Negative.  Fact Sheet for Patients:  EntrepreneurPulse.com.au  Fact Sheet for Healthcare Providers:  IncredibleEmployment.be  This test is no t yet approved or cleared by the [place] FDA and  has been authorized for detection and/or diagnosis of SARS-CoV-2 by FDA under an Emergency Use Authorization (EUA). This EUA will remain  in effect (meaning this test can be used) for the duration of the COVID-19 declaration under Section 564(b)(1) of the Act, 21 U.S.C.section 360bbb-3(b)(1), unless the authorization is terminated  or revoked sooner.       Influenza A by PCR NEGATIVE NEGATIVE Final   Influenza B by PCR NEGATIVE NEGATIVE Final    Comment: (NOTE) The Xpert Xpress  SARS-CoV-2/FLU/RSV plus assay is intended as an aid in the diagnosis of influenza from Nasopharyngeal swab specimens and should not be used as a sole basis for treatment. Nasal washings and aspirates are unacceptable for Xpert Xpress SARS-CoV-2/FLU/RSV testing.  Fact Sheet for Patients: EntrepreneurPulse.com.au  Fact Sheet for Healthcare Providers: IncredibleEmployment.be  This test is not yet approved or cleared by the [place] FDA and has been authorized for detection  and/or diagnosis of SARS-CoV-2 by FDA under an Emergency Use Authorization (EUA). This EUA will remain in effect (meaning this test can be used) for the duration of the COVID-19 declaration under Section 564(b)(1) of the Act, 21 U.S.C. section 360bbb-3(b)(1), unless the authorization is terminated or revoked.  Performed at [facility], [place] [address], [place], [place] [address]   Culture, blood (routine x 2)     Status: None (Preliminary result)   Collection Time: 10/27/20 11:26 AM   Specimen: BLOOD  Result Value Ref Range Status   Specimen Description   Final    BLOOD BLOOD LEFT HAND Performed at [place] [address], [place], [place] [address]    Special Requests   Final    BOTTLES DRAWN AEROBIC AND ANAEROBIC Blood Culture adequate volume Performed at [place] [address], [place], [place] [address]    Culture   Final    NO GROWTH < 24 HOURS Performed at [place] [address], [place], [place] [address]    Report Status PENDING  Incomplete         Radiology Studies: No results found.      Scheduled Meds:  calcium-vitamin D  1 tablet Oral Q breakfast   pantoprazole  40 mg Oral Daily   rivaroxaban  20 mg Oral Q supper   sacubitril-valsartan  1 tablet Oral BID   spironolactone  25 mg Oral Daily   Teduglutide (rDNA)  3.1 application Subcutaneous q morning    triamcinolone ointment  1 application Topical BID   Continuous Infusions:  vancomycin            Aline August, MD [facility] 10/28/2020, 11:45 AM

## 2020-10-29 DIAGNOSIS — I959 Hypotension, unspecified: Secondary | ICD-10-CM | POA: Diagnosis present

## 2020-10-29 DIAGNOSIS — D6869 Other thrombophilia: Secondary | ICD-10-CM | POA: Diagnosis present

## 2020-10-29 DIAGNOSIS — D509 Iron deficiency anemia, unspecified: Secondary | ICD-10-CM | POA: Diagnosis present

## 2020-10-29 DIAGNOSIS — N1831 Chronic kidney disease, stage 3a: Secondary | ICD-10-CM | POA: Diagnosis present

## 2020-10-29 DIAGNOSIS — R509 Fever, unspecified: Secondary | ICD-10-CM | POA: Diagnosis not present

## 2020-10-29 DIAGNOSIS — Z8673 Personal history of transient ischemic attack (TIA), and cerebral infarction without residual deficits: Secondary | ICD-10-CM | POA: Diagnosis not present

## 2020-10-29 DIAGNOSIS — T80211A Bloodstream infection due to central venous catheter, initial encounter: Secondary | ICD-10-CM | POA: Diagnosis present

## 2020-10-29 DIAGNOSIS — R0602 Shortness of breath: Secondary | ICD-10-CM | POA: Diagnosis present

## 2020-10-29 DIAGNOSIS — Z86718 Personal history of other venous thrombosis and embolism: Secondary | ICD-10-CM | POA: Diagnosis not present

## 2020-10-29 DIAGNOSIS — Z8719 Personal history of other diseases of the digestive system: Secondary | ICD-10-CM | POA: Diagnosis not present

## 2020-10-29 DIAGNOSIS — I82B12 Acute embolism and thrombosis of left subclavian vein: Secondary | ICD-10-CM | POA: Diagnosis not present

## 2020-10-29 DIAGNOSIS — D696 Thrombocytopenia, unspecified: Secondary | ICD-10-CM | POA: Diagnosis present

## 2020-10-29 DIAGNOSIS — D539 Nutritional anemia, unspecified: Secondary | ICD-10-CM | POA: Diagnosis not present

## 2020-10-29 DIAGNOSIS — M810 Age-related osteoporosis without current pathological fracture: Secondary | ICD-10-CM | POA: Diagnosis present

## 2020-10-29 DIAGNOSIS — Z833 Family history of diabetes mellitus: Secondary | ICD-10-CM | POA: Diagnosis not present

## 2020-10-29 DIAGNOSIS — B961 Klebsiella pneumoniae [K. pneumoniae] as the cause of diseases classified elsewhere: Secondary | ICD-10-CM | POA: Diagnosis present

## 2020-10-29 DIAGNOSIS — Y848 Other medical procedures as the cause of abnormal reaction of the patient, or of later complication, without mention of misadventure at the time of the procedure: Secondary | ICD-10-CM | POA: Diagnosis present

## 2020-10-29 DIAGNOSIS — D631 Anemia in chronic kidney disease: Secondary | ICD-10-CM | POA: Diagnosis present

## 2020-10-29 DIAGNOSIS — I6523 Occlusion and stenosis of bilateral carotid arteries: Secondary | ICD-10-CM | POA: Diagnosis present

## 2020-10-29 DIAGNOSIS — Z9049 Acquired absence of other specified parts of digestive tract: Secondary | ICD-10-CM | POA: Diagnosis not present

## 2020-10-29 DIAGNOSIS — Z452 Encounter for adjustment and management of vascular access device: Secondary | ICD-10-CM | POA: Diagnosis not present

## 2020-10-29 DIAGNOSIS — K760 Fatty (change of) liver, not elsewhere classified: Secondary | ICD-10-CM | POA: Diagnosis present

## 2020-10-29 DIAGNOSIS — I5022 Chronic systolic (congestive) heart failure: Secondary | ICD-10-CM | POA: Diagnosis present

## 2020-10-29 DIAGNOSIS — Z7901 Long term (current) use of anticoagulants: Secondary | ICD-10-CM | POA: Diagnosis not present

## 2020-10-29 DIAGNOSIS — K912 Postsurgical malabsorption, not elsewhere classified: Secondary | ICD-10-CM | POA: Diagnosis present

## 2020-10-29 DIAGNOSIS — K55019 Acute (reversible) ischemia of small intestine, extent unspecified: Secondary | ICD-10-CM | POA: Diagnosis not present

## 2020-10-29 DIAGNOSIS — Z20822 Contact with and (suspected) exposure to covid-19: Secondary | ICD-10-CM | POA: Diagnosis present

## 2020-10-29 DIAGNOSIS — K219 Gastro-esophageal reflux disease without esophagitis: Secondary | ICD-10-CM | POA: Diagnosis present

## 2020-10-29 DIAGNOSIS — E559 Vitamin D deficiency, unspecified: Secondary | ICD-10-CM | POA: Diagnosis present

## 2020-10-29 DIAGNOSIS — R7881 Bacteremia: Secondary | ICD-10-CM | POA: Diagnosis not present

## 2020-10-29 DIAGNOSIS — K5289 Other specified noninfective gastroenteritis and colitis: Secondary | ICD-10-CM | POA: Diagnosis present

## 2020-10-29 HISTORY — DX: Fever, unspecified: R50.9

## 2020-10-29 LAB — CBC WITH DIFFERENTIAL/PLATELET
Band Neutrophils: 0 %
Basophils Relative: 0 %
Blasts: NONE SEEN %
Eosinophils Relative: 9 %
HCT: 27.3 % — ABNORMAL LOW (ref 36.0–46.0)
Hemoglobin: 8.2 g/dL — ABNORMAL LOW (ref 12.0–15.0)
Lymphocytes Relative: 33 %
MCH: 30.9 pg (ref 26.0–34.0)
MCHC: 30 g/dL (ref 30.0–36.0)
MCV: 103 fL — ABNORMAL HIGH (ref 80.0–100.0)
Metamyelocytes Relative: NONE SEEN %
Monocytes Relative: 5 %
Myelocytes: NONE SEEN %
Neutrophils Relative %: 53 %
Platelets: 76 10*3/uL — ABNORMAL LOW (ref 150–400)
Promyelocytes Relative: NONE SEEN %
RBC Morphology: NORMAL
RBC: 2.65 MIL/uL — ABNORMAL LOW (ref 3.87–5.11)
RDW: 14.6 % (ref 11.5–15.5)
WBC Morphology: NORMAL
WBC: 4.4 10*3/uL (ref 4.0–10.5)
nRBC: 0 % (ref 0.0–0.2)
nRBC: 1 /100 WBC — ABNORMAL HIGH

## 2020-10-29 LAB — COMPREHENSIVE METABOLIC PANEL
ALT: 14 U/L (ref 0–44)
AST: 18 U/L (ref 15–41)
Albumin: 2.5 g/dL — ABNORMAL LOW (ref 3.5–5.0)
Alkaline Phosphatase: 87 U/L (ref 38–126)
Anion gap: 4 — ABNORMAL LOW (ref 5–15)
BUN: 13 mg/dL (ref 8–23)
CO2: 17 mmol/L — ABNORMAL LOW (ref 22–32)
Calcium: 8.1 mg/dL — ABNORMAL LOW (ref 8.9–10.3)
Chloride: 117 mmol/L — ABNORMAL HIGH (ref 98–111)
Creatinine, Ser: 1.12 mg/dL — ABNORMAL HIGH (ref 0.44–1.00)
GFR, Estimated: 53 mL/min — ABNORMAL LOW (ref >60)
Glucose, Bld: 78 mg/dL (ref 70–99)
Potassium: 3.8 mmol/L (ref 3.5–5.1)
Sodium: 138 mmol/L (ref 135–145)
Total Bilirubin: 0.5 mg/dL (ref 0.3–1.2)
Total Protein: 6.4 g/dL — ABNORMAL LOW (ref 6.5–8.1)

## 2020-10-29 LAB — PHOSPHORUS: Phosphorus: 3.5 mg/dL (ref 2.5–4.6)

## 2020-10-29 LAB — PREALBUMIN: Prealbumin: 11.8 mg/dL — ABNORMAL LOW (ref 18–38)

## 2020-10-29 LAB — TRIGLYCERIDES: Triglycerides: 135 mg/dL (ref <150)

## 2020-10-29 LAB — MAGNESIUM: Magnesium: 2 mg/dL (ref 1.7–2.4)

## 2020-10-29 NOTE — Progress Notes (Signed)
[facility] Infectious Diseases Follow Up Note  Patient Identification: Patient Name: [name] [name] MRN: [id number] Admit Date: 10/27/2020  8:27 AM Age: 70 y.o.Today's Date: 10/29/2020   Reason for Visit: Concern for central line infection  Principal Problem:   Bacteremia Active Problems:   Acquired short bowel syndrome   GERD (gastroesophageal reflux disease)   Chronic systolic heart failure (HCC)   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HCC)   Hypotension   Thrombocytopenia (HCC)   CKD (chronic kidney disease), stage III (HCC)   Macrocytic anemia   Fever  Antibiotics: Vancomycin 6/18 -current                    cefepime 6/18-6/19                    metronidazole 6/18   Lines/Tubes: PIV's   Interval Events: ` Continues to be afebrile, no leukocytosis, hemodynamically stable.  Blood cultures no growth to date   Assessment Chills and night sweats with concerns of central line infection, status post removal on 6/18 No other signs and symptoms concerning for infection at this time  Short gut syndrome with TPN dependence  History of several clabsi status post line removal and exchange Thrombocytopenia  Recommendations Can DC antibiotics if blood cultures are negative in 72 hours.  Okay to place new PICC line if indicated after blood cultures are negative in 72 hours Continue current antibiotics Following   Rest of the management as per the primary team. Thank you for the consult. Please page with pertinent questions or concerns.  ______________________________________________________________________ Subjective patient seen and examined at the bedside.  She is sitting in her bed with her granddaughter.  Feels well .  Denies any fevers chills.  Denies any nausea vomiting or diarrhea.  Denies any chest pain cough or shortness of breath  Vitals BP 111/72   Pulse 73   Temp 97.7 F (36.5 C) (Oral)   Resp 17   Ht 5\' 8"  (1.727 m)    Wt 64.5 kg   LMP 02/26/2014   SpO2 90%   BMI 21.62 kg/m     Physical Exam Constitutional: Not in acute distress, appears comfortable    Comments:   Cardiovascular:     Rate and Rhythm: Normal rate and regular rhythm.     Heart sounds:   Pulmonary:     Effort: Pulmonary effort is normal.     Comments:   Abdominal:     Palpations: Abdomen is soft.     Tenderness: Nontender and nondistended  Musculoskeletal:        General: No swelling or tenderness.   Skin:    Comments: No lesions or rashes  Neurological:     General: No focal deficit present.   Psychiatric:        Mood and Affect: Mood normal.   Pertinent Microbiology Results for orders placed or performed during the hospital encounter of 10/27/20  Urine culture     Status: Abnormal   Collection Time: 10/27/20  9:13 AM   Specimen: Urine, Clean Catch  Result Value Ref Range Status   Specimen Description   Final    URINE, CLEAN CATCH Performed at [facility], [place] [address] [place], [place] [address]    Special Requests   Final    NONE Performed at [facility], [place] [address], [place], [place] [address]    Culture MULTIPLE SPECIES PRESENT, SUGGEST RECOLLECTION (A)  Final   Report Status 10/28/2020 FINAL  Final  Culture, blood (routine x 2)     Status: None (Preliminary result)   Collection Time: 10/27/20  9:47 AM   Specimen: BLOOD  Result Value Ref Range Status   Specimen Description   Final    BLOOD RIGHT ANTECUBITAL Performed at [place] [address] [place], [place] [address]    Special Requests   Final    BOTTLES DRAWN AEROBIC AND ANAEROBIC Blood Culture results may not be optimal due to an excessive volume of blood received in culture bottles Performed at [place] [address], [place], [place] [address]    Culture   Final    NO GROWTH 2 DAYS Performed at [place] [address] [place] [address]    Report Status PENDING  Incomplete  Resp Panel by RT-PCR (Flu A&B, Covid) Nasopharyngeal Swab     Status: None   Collection Time: 10/27/20  9:47 AM   Specimen: Nasopharyngeal Swab; Nasopharyngeal(NP) swabs in vial transport medium  Result Value Ref Range Status   SARS Coronavirus 2 by RT PCR NEGATIVE NEGATIVE Final    Comment: (NOTE) SARS-CoV-2 target nucleic acids are NOT DETECTED.  The SARS-CoV-2 RNA is generally detectable in upper respiratory specimens during the acute phase of infection. The lowest concentration of SARS-CoV-2 viral copies this assay can detect is 138 copies/mL. A negative result does not preclude SARS-Cov-2 infection and should not be used as the sole basis for treatment or other patient management decisions. A negative result may occur with  improper specimen collection/handling, submission of specimen other than nasopharyngeal swab, presence of viral mutation(s) within the areas targeted by this assay, and inadequate number of viral copies(<138 copies/mL). A negative result must be combined with clinical observations, patient history, and epidemiological information. The expected result is Negative.  Fact Sheet for Patients:  EntrepreneurPulse.com.au  Fact Sheet for Healthcare Providers:  IncredibleEmployment.be  This test is no t yet approved or cleared by the [place] FDA and  has been authorized for detection and/or diagnosis of SARS-CoV-2 by FDA under an Emergency Use Authorization (EUA). This EUA will remain  in effect (meaning this test can be used) for the duration of the COVID-19 declaration under Section 564(b)(1) of the Act, 21 U.S.C.section 360bbb-3(b)(1), unless the authorization is terminated  or revoked sooner.       Influenza A by PCR NEGATIVE NEGATIVE Final   Influenza B by PCR NEGATIVE NEGATIVE Final    Comment: (NOTE) The Xpert Xpress SARS-CoV-2/FLU/RSV plus assay is intended as an  aid in the diagnosis of influenza from Nasopharyngeal swab specimens and should not be used as a sole basis for treatment. Nasal washings and aspirates are unacceptable for Xpert Xpress SARS-CoV-2/FLU/RSV testing.  Fact Sheet for Patients: EntrepreneurPulse.com.au  Fact Sheet for Healthcare Providers: IncredibleEmployment.be  This test is not yet approved or cleared by the [place] FDA and has been authorized for detection and/or diagnosis of SARS-CoV-2 by FDA under an Emergency Use Authorization (EUA). This EUA will remain in effect (meaning this test can be used) for the duration of the COVID-19 declaration under Section 564(b)(1) of the Act, 21 U.S.C. section 360bbb-3(b)(1), unless the authorization is terminated or revoked.  Performed at [facility], [place] [address], [place], [place] [address]   Culture, blood (routine x 2)     Status: None (Preliminary result)   Collection Time: 10/27/20 11:26 AM   Specimen: BLOOD  Result Value Ref Range Status  Specimen Description   Final    BLOOD BLOOD LEFT HAND Performed at [place] [address], [place], [place] [address]    Special Requests   Final    BOTTLES DRAWN AEROBIC AND ANAEROBIC Blood Culture adequate volume Performed at [place] [address], [place], [place] [address]    Culture   Final    NO GROWTH 2 DAYS Performed at [place] [address], [place], [place] [address]    Report Status PENDING  Incomplete   *Note: Due to a large number of results and/or encounters for the requested time period, some results have not been displayed. A complete set of results can be found in Results Review.    Pertinent Lab. CBC Latest Ref Rng & Units 10/29/2020 10/28/2020 10/27/2020  WBC 4.0 - 10.5 K/uL 4.4 4.6 7.0  Hemoglobin 12.0 - 15.0 g/dL 8.2(L) 8.0(L) 8.6(L)  Hematocrit 36.0 - 46.0 % 27.3(L) 26.2(L) 28.0(L)   Platelets 150 - 400 K/uL 76(L) 62(L) 68(L)   CMP Latest Ref Rng & Units 10/29/2020 10/28/2020 10/27/2020  Glucose 70 - 99 mg/dL 78 74 91  BUN 8 - 23 mg/dL 13 23 31(H)  Creatinine 0.44 - 1.00 mg/dL 1.12(H) 1.31(H) 1.20(H)  Sodium 135 - 145 mmol/L 138 137 136  Potassium 3.5 - 5.1 mmol/L 3.8 3.4(L) 3.5  Chloride 98 - 111 mmol/L 117(H) 113(H) 108  CO2 22 - 32 mmol/L 17(L) 18(L) 22  Calcium 8.9 - 10.3 mg/dL 8.1(L) 7.8(L) 8.1(L)  Total Protein 6.5 - 8.1 g/dL 6.4(L) 5.9(L) 6.9  Total Bilirubin 0.3 - 1.2 mg/dL 0.5 0.3 0.6  Alkaline Phos 38 - 126 U/L 87 77 89  AST 15 - 41 U/L 18 18 19   ALT 0 - 44 U/L 14 16 16      Pertinent Imaging today Plain films and CT images have been personally visualized and interpreted; radiology reports have been reviewed. Decision making incorporated into the Impression / Recommendations.  I have spent more than 35 minutes for this patient encounter including review of prior medical records, coordination of care  with greater than 50% of time being face to face/counseling and discussing diagnostics/treatment plan with the patient/family.  Electronically signed by:   Rosiland Oz, MD Infectious Disease Physician [facility] for Infectious Disease Pager: [phone]

## 2020-10-29 NOTE — Progress Notes (Signed)
Patient ID: [name] [name], female   DOB: [birth year], 70 y.o.   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  On presentation, she was afebrile with stable hemodynamics.  WBC of 7, platelets of 68, creatinine of 1.2.  She was empirically started on antibiotics.  ID was consulted.  She underwent removal of tunneled left IJ CVC by IR on 10/27/2020.  Assessment & Plan:   Subjective fever with chills and sweats with concern for bacteremia in a patient with history of multiple long-term catheter placements -Cultures negative so far.  Patient received a dose of cefepime and Flagyl in the ED and is currently on vancomycin.  ID following. -Status post removal of tunneled left IJ CVC by IR on 10/27/2020.  Blood cultures have been negative so far. -If cultures remain negative, might have to consider line replacement soon depending on recommendations from ID.  Short gut syndrome on TPN -Resume TPN once line is replaced.    Chronic systolic heart failure next-compensated.  Continue Entresto, spironolactone.  Outpatient follow-up with cardiology.  Continue Lasix as needed  Hypercoagulation syndrome History of unspecified CVA in March of this year -Continue Xarelto  Hypotension -Blood pressure on the lower side but stable.  Monitor  Thrombocytopenia -Chronic.  No signs  of bleeding.  Monitor  CKD stage III -Creatinine stable.  Monitor  Anemia of chronic disease/macrocytic anemia -Hemoglobin stable  GERD -Continue Protonix  Generalized deconditioning -PT eval    DVT prophylaxis: Xarelto Code Status: Full Family Communication: None Disposition Plan: Status is: Inpatient because: Inpatient level of care appropriate due to severity of illness  Dispo: The patient is from: Home              Anticipated d/c is to: Home              Patient currently is not medically stable to d/c.   Difficult to place patient No  Consultants: IR/ID  Procedures: tunneled left IJ CVC by IR on 10/27/2020  Antimicrobials:  Anti-infectives (From admission, onward)    Start     Dose/Rate Route Frequency Ordered Stop   10/28/20 1600  vancomycin (VANCOREADY) IVPB 1000 mg/200 mL        1,000 mg 200 mL/hr over 60 Minutes Intravenous Every 24 hours 10/27/20 1608     10/27/20 1700  vancomycin (VANCOREADY) IVPB 1250 mg/250 mL        1,250 mg 166.7 mL/hr over 90 Minutes Intravenous  Once 10/27/20 1608 10/27/20 1819   10/27/20 1200  ceFEPIme (MAXIPIME) 2 g in sodium chloride 0.9 % 100 mL IVPB       See Hyperspace for full Linked Orders Report.   2 g 200 mL/hr over 30 Minutes Intravenous  Once 10/27/20 1156 10/27/20 1257   10/27/20 1200  metroNIDAZOLE (FLAGYL) IVPB 500 mg       See Hyperspace for full Linked Orders Report.   500 mg 100 mL/hr over 60 Minutes Intravenous  Once 10/27/20 1156 10/27/20 1411  Subjective: Patient seen and examined at bedside.  No fever, worsening abdominal pain, chest pain or shortness of breath reported. Objective: Vitals:   10/28/20 0640 10/28/20 1229 10/28/20 2108 10/29/20 0536  BP: (!) 100/52 (!) 101/55 (!) 100/56 (!) 99/57  Pulse: (!) 46 (!) 48 (!) 51 (!) 53  Resp: 14 18 18 17   Temp: 97.9 F (36.6 C) 97.7 F (36.5 C) 98.4 F (36.9 C) 97.7 F (36.5 C)  TempSrc: Oral Oral Oral Oral  SpO2: 99% 100% 98% 90%  Weight:       Height:        Intake/Output Summary (Last 24 hours) at 10/29/2020 0740 Last data filed at 10/29/2020 0600 Gross per 24 hour  Intake 926.55 ml  Output 0 ml  Net 926.55 ml    Filed Weights   10/27/20 1446  Weight: 64.5 kg    Examination:  General exam: No acute distress.  On room air currently.  Looks chronically ill.   Respiratory system: Decreased breath sounds at bases bilaterally, no wheezing  cardiovascular system: Bradycardic; S1-S2 heard Gastrointestinal system: Abdomen is slightly distended, soft and nontender.  Bowel sounds are heard  extremities: No edema or clubbing Central nervous system: Awake and alert.  No focal neurological deficits.  Moves extremities Skin: No obvious lesions/ecchymosis  psychiatry: Normal mood, affect and judgment   Data Reviewed: I have personally reviewed following labs and imaging studies  CBC: Recent Labs  Lab 10/22/20 1351 10/27/20 0947 10/28/20 0447 10/29/20 0447  WBC 4.9 7.0 4.6 4.4  NEUTROABS 2.1 4.3 2.3  --   HGB 8.5* 8.6* 8.0* 8.2*  HCT 27.1* 28.0* 26.2* 27.3*  MCV 98.5 101.1* 101.9* 103.0*  PLT 92* 68* 62* 76*    Basic Metabolic Panel: Recent Labs  Lab 10/27/20 0947 10/28/20 0447 10/29/20 0447  NA 136 137 138  K 3.5 3.4* 3.8  CL 108 113* 117*  CO2 22 18* 17*  GLUCOSE 91 74 78  BUN 31* 23 13  CREATININE 1.20* 1.31* 1.12*  CALCIUM 8.1* 7.8* 8.1*  MG  --  1.9 2.0  PHOS  --  3.9 3.5    GFR: Estimated Creatinine Clearance: 47.1 mL/min (A) (by C-G formula based on SCr of 1.12 mg/dL (H)). Liver Function Tests: Recent Labs  Lab 10/27/20 0947 10/28/20 0447 10/29/20 0447  AST 19 18 18   ALT 16 16 14   ALKPHOS 89 77 87  BILITOT 0.6 0.3 0.5  PROT 6.9 5.9* 6.4*  ALBUMIN 2.8* 2.3* 2.5*    No results for input(s): LIPASE, AMYLASE in the last 168 hours. No results for input(s): AMMONIA in the last 168 hours. Coagulation Profile: No results for input(s): INR, PROTIME in the last 168 hours. Cardiac  Enzymes: No results for input(s): CKTOTAL, CKMB, CKMBINDEX, TROPONINI in the last 168 hours. BNP (last 3 results) No results for input(s): PROBNP in the last 8760 hours. HbA1C: No results for input(s): HGBA1C in the last 72 hours. CBG: No results for input(s): GLUCAP in the last 168 hours. Lipid Profile: Recent Labs    10/28/20 0447 10/29/20 0447  TRIG 146 135    Thyroid Function Tests: No results for input(s): TSH, T4TOTAL, FREET4, T3FREE, THYROIDAB in the last 72 hours. Anemia Panel: No results for input(s): VITAMINB12, FOLATE, FERRITIN, TIBC, IRON, RETICCTPCT in the last 72 hours. Sepsis Labs: Recent Labs  Lab 10/27/20 0947 10/27/20 1126  LATICACIDVEN 0.9 1.5     Recent Results (from the past 240 hour(s))  Culture, Blood  Status: None   Collection Time: 10/22/20  1:51 PM   Specimen: BLOOD  Result Value Ref Range Status   Specimen Description   Final    BLOOD Performed at [facility], [place] [address] [place], [place] [address]    Special Requests   Final    NONE Performed at [facility], [place] [address], [place], [place] [address]    Culture   Final    NO GROWTH 5 DAYS Performed at [facility], [place] [address] [place], [place] [address]    Report Status 10/27/2020 FINAL  Final  Urine culture     Status: Abnormal   Collection Time: 10/27/20  9:13 AM   Specimen: Urine, Clean Catch  Result Value Ref Range Status   Specimen Description   Final    URINE, CLEAN CATCH Performed at [facility], [place] [address] [place], [place] [address]    Special Requests   Final    NONE Performed at [facility], [place] [address], [place], [place] [address]    Culture MULTIPLE SPECIES PRESENT, SUGGEST RECOLLECTION (A)  Final   Report Status 10/28/2020 FINAL  Final  Culture, blood (routine x 2)     Status: None (Preliminary result)   Collection Time: 10/27/20  9:47 AM   Specimen:  BLOOD  Result Value Ref Range Status   Specimen Description   Final    BLOOD RIGHT ANTECUBITAL Performed at [facility] [address] [place], [place] [address]    Special Requests   Final    BOTTLES DRAWN AEROBIC AND ANAEROBIC Blood Culture results may not be optimal due to an excessive volume of blood received in culture bottles Performed at [facility] [address], [place], [place] [address]    Culture   Final    NO GROWTH < 24 HOURS Performed at [facility] [address] [place], [place] [address]    Report Status PENDING  Incomplete  Resp Panel by RT-PCR (Flu A&B, Covid) Nasopharyngeal Swab     Status: None   Collection Time: 10/27/20  9:47 AM   Specimen: Nasopharyngeal Swab; Nasopharyngeal(NP) swabs in vial transport medium  Result Value Ref Range Status   SARS Coronavirus 2 by RT PCR NEGATIVE NEGATIVE Final    Comment: (NOTE) SARS-CoV-2 target nucleic acids are NOT DETECTED.  The SARS-CoV-2 RNA is generally detectable in upper respiratory specimens during the acute phase of infection. The lowest concentration of SARS-CoV-2 viral copies this assay can detect is 138 copies/mL. A negative result does not preclude SARS-Cov-2 infection and should not be used as the sole basis for treatment or other patient management decisions. A negative result may occur with  improper specimen collection/handling, submission of specimen other than nasopharyngeal swab, presence of viral mutation(s) within the areas targeted by this assay, and inadequate number of viral copies(<138 copies/mL). A negative result must be combined with clinical observations, patient history, and epidemiological information. The expected result is Negative.  Fact Sheet for Patients:  EntrepreneurPulse.com.au  Fact Sheet for Healthcare Providers:  IncredibleEmployment.be  This test is no t yet approved or cleared by the  [place] FDA and  has been authorized for detection and/or diagnosis of SARS-CoV-2 by FDA under an Emergency Use Authorization (EUA). This EUA will remain  in effect (meaning this test can be used) for the duration of the COVID-19 declaration under Section 564(b)(1) of the Act, 21 U.S.C.section  360bbb-3(b)(1), unless the authorization is terminated  or revoked sooner.       Influenza A by PCR NEGATIVE NEGATIVE Final   Influenza B by PCR NEGATIVE NEGATIVE Final    Comment: (NOTE) The Xpert Xpress SARS-CoV-2/FLU/RSV plus assay is intended as an aid in the diagnosis of influenza from Nasopharyngeal swab specimens and should not be used as a sole basis for treatment. Nasal washings and aspirates are unacceptable for Xpert Xpress SARS-CoV-2/FLU/RSV testing.  Fact Sheet for Patients: EntrepreneurPulse.com.au  Fact Sheet for Healthcare Providers: IncredibleEmployment.be  This test is not yet approved or cleared by the [place] FDA and has been authorized for detection and/or diagnosis of SARS-CoV-2 by FDA under an Emergency Use Authorization (EUA). This EUA will remain in effect (meaning this test can be used) for the duration of the COVID-19 declaration under Section 564(b)(1) of the Act, 21 U.S.C. section 360bbb-3(b)(1), unless the authorization is terminated or revoked.  Performed at [facility], [place] [address] [place], [place] [address]   Culture, blood (routine x 2)     Status: None (Preliminary result)   Collection Time: 10/27/20 11:26 AM   Specimen: BLOOD  Result Value Ref Range Status   Specimen Description   Final    BLOOD BLOOD LEFT HAND Performed at [facility] [address] [place], [place] [address]    Special Requests   Final    BOTTLES DRAWN AEROBIC AND ANAEROBIC Blood Culture adequate volume Performed at [facility] [address] [place], [place]  [address]    Culture   Final    NO GROWTH < 24 HOURS Performed at [facility] [address], [place], [place] [address]    Report Status PENDING  Incomplete          Radiology Studies: No results found.      Scheduled Meds:  (feeding supplement) PROSource Plus  30 mL Oral TID BM   calcium-vitamin D  1 tablet Oral Q breakfast   multivitamin with minerals  1 tablet Oral Daily   pantoprazole  40 mg Oral Daily   rivaroxaban  20 mg Oral Q supper   sacubitril-valsartan  1 tablet Oral BID   spironolactone  25 mg Oral Daily   Teduglutide (rDNA)  3.1 application Subcutaneous q morning   triamcinolone ointment  1 application Topical BID   Continuous Infusions:  vancomycin 1,000 mg (10/28/20 1615)          Aline August, MD [facility] 10/29/2020, 7:40 AM

## 2020-10-29 NOTE — Consult Note (Signed)
Chief Complaint: Replacement line for TPN. Request is for tunneled central catheter replacement  Referring Physician(s): Dr. Geannie Risen  Supervising Physician: Marliss Coots  Patient Status: [name] - In-pt  History of Present Illness: [name] [name] is a 70 y.o. female History of  normocytic anemia, brachial vein thrombosis, carotid stenosis, CHF and short gut syndrome since 2006. 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 Left IJ catheter last exchanged on 2.28.20. Patient presented to the ED at [facility] on 6.18.22 chills, sweats and subjective fevers X 1 week. IR removed her Left IJ tunneled catheter due to concern for infection. Team is requesting tunneled central line replacement.   Daughter and granddaughter present in  the patient's room. Currently without any significant complaints. Patient alert and laying in bed, calm and comfortable. Denies any fevers, headache, chest pain, SOB, cough, abdominal pain, nausea, vomiting or bleeding. She is expressing some concern that cultures have been negative to date and that she has poor vascular access with limited options. Patient made aware that access may be through the groin as options are discussed. Return precautions and treatment recommendations and follow-up discussed with the patient who is agreeable with the plan.   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 07/31/2017   Fracture of left clavicle    History of blood transfusion 2013   anemia   Hx of cardiovascular stress test    Myoview (9/15):  inf-apical scar; no ischemia; EF 47% - low risk    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 12/12/2011   Osteoporosis    Pancytopenia 10/07/2011   Pathologic fracture of neck of femur (HCC)    Personal history of colonic polyps    Renal insufficiency    hx of yrs ago   Serratia marcescens infection - bactermia assoc w/ PICC 01/18/2015   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HCC)    Splenomegaly    By ultrasound   Stroke (HCC) 07/2020   Thrombophilia (HCC)    Vitamin D deficiency     Past Surgical History:  Procedure Laterality Date   APPENDECTOMY  yrs ago   CHOLECYSTECTOMY  yrs ago   COLONOSCOPY  12/05/2005   internal hemorrhoids (for polyp surveillance)   COLONOSCOPY  04/26/2012   Procedure: COLONOSCOPY;  Surgeon: Iva Boop, MD;  Location: [facility];  Service: Endoscopy;  Laterality: N/A;   COLONOSCOPY WITH PROPOFOL N/A 03/18/2016   Procedure: COLONOSCOPY WITH PROPOFOL;  Surgeon: Iva Boop, MD;  Location: [facility];  Service: Endoscopy;  Laterality: N/A;   ESOPHAGOGASTRODUODENOSCOPY  01/22/2009   erosive esophagitis   HARDWARE REMOVAL Left 12/31/2017   Procedure: HARDWARE REMOVAL;  Surgeon: Samson Frederic, MD;  Location: [facility];  Service: Orthopedics;  Laterality: Left;   INTRAMEDULLARY (IM) NAIL INTERTROCHANTERIC Left  12/31/2017   Procedure: INTRAMEDULLARY (IM) NAIL SUBTROCHANTRIC;  Surgeon: Rod Can, MD;  Location: [facility];  Service: Orthopedics;  Laterality: Left;   IR CV LINE INJECTION  03/23/2018   IR FLUORO GUIDE CV LINE LEFT  01/01/2018   IR FLUORO GUIDE CV LINE LEFT  03/24/2018   IR FLUORO GUIDE CV LINE LEFT  05/21/2018   IR FLUORO GUIDE CV LINE LEFT  07/09/2018   IR GENERIC HISTORICAL  07/04/2016   IR REMOVAL TUN CV CATH W/O FL 07/04/2016 Ascencion Dike, PA-C  [facility]   IR GENERIC HISTORICAL  07/10/2016   IR US GUIDE VASC ACCESS LEFT 07/10/2016 Arne Cleveland, MD [facility]   IR GENERIC HISTORICAL  07/10/2016   IR FLUORO GUIDE CV LINE LEFT 07/10/2016 Arne Cleveland, MD [facility]   IR PATIENT EVAL TECH 0-60 MINS  10/07/2017   IR PATIENT EVAL TECH 0-60 MINS  03/09/2018   IR RADIOLOGIST EVAL & MGMT  07/21/2017   LEFT HEART CATH AND CORONARY ANGIOGRAPHY N/A 06/26/2020   Procedure: LEFT HEART CATH AND CORONARY ANGIOGRAPHY;  Surgeon: Jolaine Artist, MD;  Location: [facility];  Service: Cardiovascular;  Laterality: N/A;   ORIF PROXIMAL FEMORAL FRACTURE W/ ITST NAIL SYSTEM  03/2007   left, Dr. Shellia Carwin   SMALL INTESTINE SURGERY  2005   multiple with right colon resection for ischemia/infarct    Allergies: Penicillins  Medications: Prior to Admission medications   Medication Sig Start Date End Date Taking? Authorizing Provider  acetaminophen (TYLENOL) 500 MG tablet Take 1,000 mg by mouth daily as needed for mild pain.   Yes [provider]  ADULT TPN Inject 1,800 mLs into the vein 4 (four) times a week. Pt receives home TPN from [facility]:  1800 mL bag, four nights weekly (Monday, Tuesday, Wednesday, Thursday for 8 hours (includes 1 hour taper up and down).   Yes [provider]  baclofen (LIORESAL) 10 MG tablet Take 1 tablet (10 mg total) by mouth 2 (two) times daily as needed for muscle spasms. 08/29/20  Yes McCue, Janett Billow, NP  Calcium Carb-Cholecalciferol (CALCIUM + D3 PO) Take 2 tablets by mouth daily.   Yes [provider]  cetirizine (ZYRTEC) 10 MG tablet Take 10 mg by mouth at bedtime as needed for allergies.   Yes [provider]  diphenhydrAMINE (BENADRYL) 25 mg capsule Take 25-50 mg by mouth every 6 (six) hours as needed for itching or sleep.    Yes [provider]  fluticasone (FLONASE) 50 MCG/ACT nasal spray Place 2 sprays into both nostrils daily as needed for allergies or  rhinitis.   Yes [provider]  furosemide (LASIX) 20 MG tablet TAKE ONE TABLET BY MOUTH AS NEEDED FOR SWELLING Patient taking differently: Take 20 mg by mouth daily as needed for fluid. 08/01/20  Yes Bensimhon, Shaune Pascal, MD  Heparin Sodium, Porcine, (HEPARIN LOCK FLUSH IJ) Inject 5 mLs as directed 4 (four) times a week. Monday, Tuesday, Wednesday, Thursday in the morning with TPN   Yes [provider]  Multiple Vitamins-Minerals (ADULT GUMMY PO) Take 2 tablets by mouth daily.   Yes [provider]  omeprazole (PRILOSEC) 40 MG capsule Take 40 mg by mouth daily.   Yes [provider]  ondansetron (ZOFRAN-ODT) 8 MG disintegrating tablet TAKE 1 TABLET (8 MG TOTAL) BY MOUTH EVERY 8 (EIGHT) HOURS AS NEEDED FOR NAUSEA OR VOMITING. 12/05/19  Yes Gatha Mayer, MD  sacubitril-valsartan (ENTRESTO) 24-26 MG Take 1 tablet by mouth  2 (two) times daily. 05/09/20  Yes Josue Hector, MD  sodium chloride 0.9 % infusion Inject 900 mLs into the vein every 14 (fourteen) days. Uses for TPN 10/27/17  Yes [provider]  spironolactone (ALDACTONE) 25 MG tablet Take 1 tablet (25 mg total) by mouth daily. 07/04/20  Yes Bensimhon, Shaune Pascal, MD  Teduglutide, rDNA, 5 MG KIT Inject 3.1 application into the skin every morning.   Yes [provider]  triamcinolone ointment (KENALOG) 0.1 % Apply 1 application topically 2 (two) times daily. 10/15/20 10/15/21 Yes [name], MD  vitamin E 1000 UNIT capsule Take 1,000 Units by mouth daily.   Yes [provider]  XARELTO 20 MG TABS tablet TAKE 1 TABLET (20 MG TOTAL) BY MOUTH DAILY WITH SUPPER. Patient taking differently: Take 20 mg by mouth daily with supper. 02/17/20  Yes [name], MD     Family History  Problem Relation Age of Onset   Diabetes Mother    Hypertension Mother    AAA (abdominal aortic aneurysm) Mother    Colon cancer Neg Hx    Stomach cancer Neg Hx     Social History    Socioeconomic History   Marital status: Married    Spouse name: Not on file   Number of children: Not on file   Years of education: Not on file   Highest education level: Not on file  Occupational History   Not on file  Tobacco Use   Smoking status: Former    Pack years: 0.00    Types: Cigarettes    Quit date: 05/12/2004    Years since quitting: 16.4   Smokeless tobacco: Never  Vaping Use   Vaping Use: Never used  Substance and Sexual Activity   Alcohol use: No   Drug use: No   Sexual activity: Yes    Comment: 1st intercourse 33 yo-5 partners  Other Topics Concern   Not on file  Social History Narrative   Married to [place], has 1 daughter and a granddaughter the patient's son died in childhood due to a motor vehicle wreck   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  All other systems are negative.  Review of Systems  Constitutional:  Negative for fatigue and fever.  HENT:  Negative for congestion.   Respiratory:  Negative for cough and shortness of breath.   Gastrointestinal:  Negative for abdominal pain, diarrhea, nausea and vomiting.   Vital Signs: BP 111/72   Pulse 73   Temp 97.7 F (36.5 C) (Oral)   Resp 17   Ht $R'5\' 8"'yJ$  (1.727 m)   Wt 142 lb 3.2 oz (64.5 kg)   LMP 02/26/2014   SpO2 90%   BMI 21.62 kg/m   Physical Exam Vitals and nursing note reviewed.  Constitutional:      Appearance: She is well-developed.  HENT:     Head: Normocephalic and  atraumatic.  Eyes:     Conjunctiva/sclera:  Conjunctivae normal.  Pulmonary:     Effort: Pulmonary effort is normal.  Musculoskeletal:     Cervical back: Normal range of motion.  Neurological:     Mental Status: She is alert and oriented to person, place, and time.    Imaging: US Abdomen Complete  Result Date: 10/03/2020 CLINICAL DATA:  Non alcoholic fatty liver disease and splenomegaly. EXAM: ABDOMEN ULTRASOUND COMPLETE COMPARISON:  [place] 25, 2020 FINDINGS: Gallbladder: Surgically absent Common bile duct: Diameter: 3 mm Liver: No focal lesion identified. Diffusely increased parenchymal echogenicity with decreased acoustic through transmission. There is no overt contour nodularity. Portal vein is patent on color Doppler imaging with normal direction of blood flow towards the liver. IVC: No abnormality visualized. Pancreas: Visualized portion unremarkable. Spleen: Splenomegaly similar prior measuring 10.5 cm in maximum dimension with a volume of 369 cc. Right Kidney: Length: 10.9 cm. Echogenicity within normal limits. No mass or hydronephrosis visualized. Left Kidney: Length: 10.3 cm. Echogenicity within normal limits. No mass or hydronephrosis visualized. Abdominal aorta: No aneurysm visualized. Other findings: None. IMPRESSION: 1. The echogenicity of the liver is increased. This is a nonspecific finding but is most commonly seen with fatty infiltration of the liver. There are no obvious focal liver lesions. 2. Stable splenomegaly. Electronically Signed   By: Dahlia Bailiff MD   On: 10/03/2020 16:59    Labs:  CBC: Recent Labs    10/22/20 1351 10/27/20 0947 10/28/20 0447 10/29/20 0447  WBC 4.9 7.0 4.6 4.4  HGB 8.5* 8.6* 8.0* 8.2*  HCT 27.1* 28.0* 26.2* 27.3*  PLT 92* 68* 62* 76*    COAGS: Recent Labs    07/23/20 1920  INR 1.8*  APTT 35    BMP: Recent Labs    07/26/20 0453 08/13/20 0949 10/10/20 1051 10/27/20 0947 10/28/20 0447 10/29/20 0447  NA 135   < > 138 136 137 138   K 4.0   < > 4.0 3.5 3.4* 3.8  CL 114*   < > 111 108 113* 117*  CO2 17*   < > 21 22 18* 17*  GLUCOSE 116*   < > 105* 91 74 78  BUN 14   < > 25* 31* 23 13  CALCIUM 8.3*   < > 7.9* 8.1* 7.8* 8.1*  CREATININE 1.14*   < > 1.05 1.20* 1.31* 1.12*  GFRNONAA 52*  --   --  49* 44* 53*   < > = values in this interval not displayed.    LIVER FUNCTION TESTS: Recent Labs    10/10/20 1051 10/27/20 0947 10/28/20 0447 10/29/20 0447  BILITOT 0.5 0.6 0.3 0.5  AST $Re'14 19 18 18  'rsQ$ ALT $R'13 16 16 14  'Om$ ALKPHOS 98 89 77 87  PROT 6.5 6.9 5.9* 6.4*  ALBUMIN 2.8* 2.8* 2.3* 2.5*     Assessment and Plan:  70 y.o. female inpatient. History of  normocytic anemia, brachial vein thrombosis, carotid stenosis, CHF and short gut syndrome since 2006. 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 Left IJ catheter last exchanged on 2.28.20. Patient presented to the ED at [facility] on 6.18.22 chills, sweats and subjective fevers X 1 week. IR removed her Left IJ tunneled catheter due to concern for infection. Team is requesting tunneled central line replacement.   WBC 4.4. Blood Cultures negative x 2 days. Tip cultures not resulted. Allergies include PCN.  Risks and benefits of image guided tunneled central catheter line placement was discussed with  the patient including, but not limited to bleeding, infection, pneumothorax, or fibrin sheath development and need for additional procedures.  All of the patient's questions were answered, patient is agreeable to proceed.  Consent signed and in chart.   Thank you for this interesting consult.  I greatly enjoyed meeting [name] [name] and look forward to participating in their care.  A copy of this report was sent to the requesting provider on this date.  Electronically Signed: Jacqualine Mau, NP 10/29/2020, 12:09 PM   I spent a total of 40 Minutes    in face to face in clinical consultation,  greater than 50% of which was counseling/coordinating care for Tunneled central catheter replacement.

## 2020-10-30 DIAGNOSIS — K912 Postsurgical malabsorption, not elsewhere classified: Secondary | ICD-10-CM | POA: Diagnosis not present

## 2020-10-30 DIAGNOSIS — D539 Nutritional anemia, unspecified: Secondary | ICD-10-CM | POA: Diagnosis not present

## 2020-10-30 DIAGNOSIS — I5022 Chronic systolic (congestive) heart failure: Secondary | ICD-10-CM | POA: Diagnosis not present

## 2020-10-30 DIAGNOSIS — D696 Thrombocytopenia, unspecified: Secondary | ICD-10-CM | POA: Diagnosis not present

## 2020-10-30 DIAGNOSIS — R7881 Bacteremia: Secondary | ICD-10-CM | POA: Diagnosis not present

## 2020-10-30 DIAGNOSIS — N1831 Chronic kidney disease, stage 3a: Secondary | ICD-10-CM | POA: Diagnosis not present

## 2020-10-30 LAB — BLOOD CULTURE ID PANEL (REFLEXED) - BCID2

## 2020-10-30 LAB — CBC WITH DIFFERENTIAL/PLATELET
Abs Immature Granulocytes: 0.02 10*3/uL (ref 0.00–0.07)
Basophils Absolute: 0.1 10*3/uL (ref 0.0–0.1)
Basophils Relative: 1 %
Eosinophils Absolute: 0.2 10*3/uL (ref 0.0–0.5)
Eosinophils Relative: 4 %
HCT: 30.7 % — ABNORMAL LOW (ref 36.0–46.0)
Hemoglobin: 8.7 g/dL — ABNORMAL LOW (ref 12.0–15.0)
Immature Granulocytes: 0 %
Lymphocytes Relative: 40 %
Lymphs Abs: 2.1 10*3/uL (ref 0.7–4.0)
MCH: 30.3 pg (ref 26.0–34.0)
MCHC: 28.3 g/dL — ABNORMAL LOW (ref 30.0–36.0)
MCV: 107 fL — ABNORMAL HIGH (ref 80.0–100.0)
Monocytes Absolute: 0.4 10*3/uL (ref 0.1–1.0)
Monocytes Relative: 8 %
Neutro Abs: 2.5 10*3/uL (ref 1.7–7.7)
Neutrophils Relative %: 47 %
Platelets: 84 10*3/uL — ABNORMAL LOW (ref 150–400)
RBC: 2.87 MIL/uL — ABNORMAL LOW (ref 3.87–5.11)
RDW: 14.6 % (ref 11.5–15.5)
WBC: 5.3 10*3/uL (ref 4.0–10.5)
nRBC: 0 % (ref 0.0–0.2)

## 2020-10-30 MED ORDER — SODIUM CHLORIDE 0.9 % IV SOLN
2.0000 g | INTRAVENOUS | Status: DC
  Administered 2020-10-30 – 2020-11-01 (×3): 2 g via INTRAVENOUS
  Filled 2020-10-30: qty 20
  Filled 2020-10-30: qty 2
  Filled 2020-10-30: qty 20
  Filled 2020-10-30: qty 2

## 2020-10-30 NOTE — Progress Notes (Signed)
PT Screen Note  Patient Details Name: [name] [name] MRN: [id number] DOB: 03-07-1951   Cancelled Treatment:    Reason Eval/Treat Not Completed: PT screened, no needs identified, will sign off Patient reports she has been mobilizing independently or with supervision from RN staff in hallway and independently in room. Pt denies falls in last 6 months. No acute PT needs. Will sign off at this time, please re-consult if there is a change in functional mobility.  Verner Mould, DPT Acute Rehabilitation Services Office [phone] Pager [phone]

## 2020-10-30 NOTE — Progress Notes (Signed)
PHARMACY - PHYSICIAN COMMUNICATION CRITICAL VALUE ALERT - BLOOD CULTURE IDENTIFICATION (BCID)  [name] [name] is an 70 y.o. female who presented to [facility] on 10/27/2020 with bacteremia/concern for line infection.  Assessment:  +BCID for Klebsiella pneumoniae in 1/4 bottles.  No KPC and no ESBL detected.  Name of physician (or Provider) Contacted: Gershon Cull, FNP  Current antibiotics: Vancomycin 1gm IV q24h  Changes to prescribed antibiotics recommended:  D/C Vancomycin Begin Ceftriaxone 2gm IV q24h (pt with noted PCN allergy; however review of Epic shows patient has received ceftriaxone in the past)  Results for orders placed or performed during the hospital encounter of 10/27/20  Blood Culture ID Panel (Reflexed) (Collected: 10/27/2020 11:26 AM)  Result Value Ref Range   Enterococcus faecalis NOT DETECTED NOT DETECTED   Enterococcus Faecium NOT DETECTED NOT DETECTED   Listeria monocytogenes NOT DETECTED NOT DETECTED   Staphylococcus species NOT DETECTED NOT DETECTED   Staphylococcus aureus (BCID) NOT DETECTED NOT DETECTED   Staphylococcus epidermidis NOT DETECTED NOT DETECTED   Staphylococcus lugdunensis NOT DETECTED NOT DETECTED   Streptococcus species NOT DETECTED NOT DETECTED   Streptococcus agalactiae NOT DETECTED NOT DETECTED   Streptococcus pneumoniae NOT DETECTED NOT DETECTED   Streptococcus pyogenes NOT DETECTED NOT DETECTED   A.calcoaceticus-baumannii NOT DETECTED NOT DETECTED   Bacteroides fragilis NOT DETECTED NOT DETECTED   Enterobacterales DETECTED (A) NOT DETECTED   Enterobacter cloacae complex NOT DETECTED NOT DETECTED   Escherichia coli NOT DETECTED NOT DETECTED   Klebsiella aerogenes NOT DETECTED NOT DETECTED   Klebsiella oxytoca NOT DETECTED NOT DETECTED   Klebsiella pneumoniae DETECTED (A) NOT DETECTED   Proteus species NOT DETECTED NOT DETECTED   Salmonella species NOT DETECTED NOT DETECTED   Serratia marcescens NOT DETECTED NOT DETECTED   Haemophilus  influenzae NOT DETECTED NOT DETECTED   Neisseria meningitidis NOT DETECTED NOT DETECTED   Pseudomonas aeruginosa NOT DETECTED NOT DETECTED   Stenotrophomonas maltophilia NOT DETECTED NOT DETECTED   Candida albicans NOT DETECTED NOT DETECTED   Candida auris NOT DETECTED NOT DETECTED   Candida glabrata NOT DETECTED NOT DETECTED   Candida krusei NOT DETECTED NOT DETECTED   Candida parapsilosis NOT DETECTED NOT DETECTED   Candida tropicalis NOT DETECTED NOT DETECTED   Cryptococcus neoformans/gattii NOT DETECTED NOT DETECTED   CTX-M ESBL NOT DETECTED NOT DETECTED   Carbapenem resistance IMP NOT DETECTED NOT DETECTED   Carbapenem resistance KPC NOT DETECTED NOT DETECTED   Carbapenem resistance NDM NOT DETECTED NOT DETECTED   Carbapenem resist OXA 48 LIKE NOT DETECTED NOT DETECTED   Carbapenem resistance VIM NOT DETECTED NOT DETECTED    Everette Rank, PharmD 10/30/2020  3:31 AM

## 2020-10-30 NOTE — Progress Notes (Signed)
ID Brief Note   Blood cx 10/27/20 1/4 bottles GNR  Cath tip cx 6/18 culture reincubating  DC Vancomycin, Continue ceftriaxone  Repeat blood cultures    Rosiland Oz, MD Infectious Disease Physician [facility] [address] [place], [place] [address] Phone: [phone]  Fax: [phone]

## 2020-10-30 NOTE — Progress Notes (Signed)
Patient ID: [name] [name], female   DOB: [birth year], 70 y.o.   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  On presentation, she was afebrile with stable hemodynamics.  WBC of 7, platelets of 68, creatinine of 1.2.  She was empirically started on antibiotics.  ID was consulted.  She underwent removal of tunneled left IJ CVC by IR on 10/27/2020.  Assessment & Plan:   Gram-negative rod bacteremia in a patient with history of multiple long-term catheter placements -Blood culture from 10/27/2020 is growing gram-negative rod.  ID following: Antibiotics has been switched to IV Rocephin.  Follow repeat blood cultures. -Status post removal of tunneled left IJ CVC by IR on 10/27/2020.   -will have to hold line replacement till repeat blood cultures are negative.  Short gut syndrome on TPN -Resume TPN once line is replaced.    Chronic systolic heart failure  -compensated.  Continue Entresto, spironolactone.  Outpatient follow-up with cardiology.  Continue Lasix as needed  Hypercoagulation syndrome History of unspecified CVA in March of this year -Continue Xarelto  Hypotension -Blood pressure on the lower side but stable.  Monitor  Thrombocytopenia -Chronic.  No signs of bleeding.  Monitor  CKD stage III -Creatinine stable.  Monitor  Anemia of chronic  disease/macrocytic anemia -Hemoglobin stable  GERD -Continue Protonix  Generalized deconditioning -PT eval    DVT prophylaxis: Xarelto Code Status: Full Family Communication: Daughter at bedside  disposition Plan: Status is: Inpatient because: Inpatient level of care appropriate due to severity of illness  Dispo: The patient is from: Home              Anticipated d/c is to: Home              Patient currently is not medically stable to d/c.   Difficult to place patient No  Consultants: IR/ID  Procedures: tunneled left IJ CVC by IR on 10/27/2020  Antimicrobials:  Anti-infectives (From admission, onward)    Start     Dose/Rate Route Frequency Ordered Stop   10/30/20 0430  cefTRIAXone (ROCEPHIN) 2 g in sodium chloride 0.9 % 100 mL IVPB        2 g 200 mL/hr over 30 Minutes Intravenous Every 24 hours 10/30/20 0341     10/28/20 1600  vancomycin (VANCOREADY) IVPB 1000 mg/200 mL  Status:  Discontinued        1,000 mg 200 mL/hr over 60 Minutes Intravenous Every 24 hours 10/27/20 1608 10/30/20 0340   10/27/20 1700  vancomycin (VANCOREADY) IVPB 1250 mg/250 mL        1,250 mg 166.7 mL/hr over 90 Minutes Intravenous  Once 10/27/20 1608 10/27/20 1819   10/27/20 1200  ceFEPIme (MAXIPIME) 2 g in sodium chloride 0.9 % 100 mL IVPB       See Hyperspace for full Linked Orders Report.   2 g 200 mL/hr over 30 Minutes Intravenous  Once 10/27/20 1156 10/27/20 1257   10/27/20 1200  metroNIDAZOLE (FLAGYL) IVPB 500 mg  See Hyperspace for full Linked Orders Report.   500 mg 100 mL/hr over 60 Minutes Intravenous  Once 10/27/20 1156 10/27/20 1411        Subjective: Patient seen and examined at bedside.  Denies worsening fever, vomiting, abdominal pain, chest pain or shortness of breath.  Objective: Vitals:   10/29/20 1050 10/29/20 1351 10/29/20 2104 10/30/20 0531  BP: 111/72 108/61 (!) 94/57 (!) 108/59  Pulse: 73 (!) 54 (!) 51 60  Resp:  18 18 15   Temp:  98.2 F (36.8 C) 98.4 F  (36.9 C) (!) 97.5 F (36.4 C)  TempSrc:   Oral   SpO2:  98% 99% 91%  Weight:      Height:        Intake/Output Summary (Last 24 hours) at 10/30/2020 0744 Last data filed at 10/30/2020 0653 Gross per 24 hour  Intake 340 ml  Output --  Net 340 ml    Filed Weights   10/27/20 1446  Weight: 64.5 kg    Examination:  General exam: Currently on room air.  No distress.  Looks chronically ill.   Respiratory system: Bilateral decreased breath sounds at bases with some crackles  cardiovascular system: S1-S2 heard, intermittently bradycardic Gastrointestinal system: Abdomen is distended mildly, soft and nontender.  Normal bowel sounds heard extremities: No cyanosis or edema Central nervous system: Alert and oriented.  No focal neurological deficits.  Moving extremities Skin: No obvious petechiae/rashes psychiatry: Normal judgment, affect and mood   Data Reviewed: I have personally reviewed following labs and imaging studies  CBC: Recent Labs  Lab 10/27/20 0947 10/28/20 0447 10/29/20 0447 10/30/20 0429  WBC 7.0 4.6 4.4 5.3  NEUTROABS 4.3 2.3  --  2.5  HGB 8.6* 8.0* 8.2* 8.7*  HCT 28.0* 26.2* 27.3* 30.7*  MCV 101.1* 101.9* 103.0* 107.0*  PLT 68* 62* 76* 84*    Basic Metabolic Panel: Recent Labs  Lab 10/27/20 0947 10/28/20 0447 10/29/20 0447  NA 136 137 138  K 3.5 3.4* 3.8  CL 108 113* 117*  CO2 22 18* 17*  GLUCOSE 91 74 78  BUN 31* 23 13  CREATININE 1.20* 1.31* 1.12*  CALCIUM 8.1* 7.8* 8.1*  MG  --  1.9 2.0  PHOS  --  3.9 3.5    GFR: Estimated Creatinine Clearance: 47.1 mL/min (A) (by C-G formula based on SCr of 1.12 mg/dL (H)). Liver Function Tests: Recent Labs  Lab 10/27/20 0947 10/28/20 0447 10/29/20 0447  AST 19 18 18   ALT 16 16 14   ALKPHOS 89 77 87  BILITOT 0.6 0.3 0.5  PROT 6.9 5.9* 6.4*  ALBUMIN 2.8* 2.3* 2.5*    No results for input(s): LIPASE, AMYLASE in the last 168 hours. No results for input(s): AMMONIA in the last 168  hours. Coagulation Profile: No results for input(s): INR, PROTIME in the last 168 hours. Cardiac Enzymes: No results for input(s): CKTOTAL, CKMB, CKMBINDEX, TROPONINI in the last 168 hours. BNP (last 3 results) No results for input(s): PROBNP in the last 8760 hours. HbA1C: No results for input(s): HGBA1C in the last 72 hours. CBG: No results for input(s): GLUCAP in the last 168 hours. Lipid Profile: Recent Labs    10/28/20 0447 10/29/20 0447  TRIG 146 135    Thyroid Function Tests: No results for input(s): TSH, T4TOTAL, FREET4, T3FREE, THYROIDAB in the last 72 hours. Anemia Panel: No results for input(s): VITAMINB12, FOLATE, FERRITIN, TIBC, IRON, RETICCTPCT in the last 72 hours. Sepsis Labs: Recent Labs  Lab 10/27/20 1027 10/27/20  1126  LATICACIDVEN 0.9 1.5     Recent Results (from the past 240 hour(s))  Culture, Blood     Status: None   Collection Time: 10/22/20  1:51 PM   Specimen: BLOOD  Result Value Ref Range Status   Specimen Description   Final    BLOOD Performed at [facility], [place] [address] [place], [place] [address]    Special Requests   Final    NONE Performed at [facility], [place] [address] [place], [place] [address]    Culture   Final    NO GROWTH 5 DAYS Performed at [facility], [place] [address] [place], [place] [address]    Report Status 10/27/2020 FINAL  Final  Urine culture     Status: Abnormal   Collection Time: 10/27/20  9:13 AM   Specimen: Urine, Clean Catch  Result Value Ref Range Status   Specimen Description   Final    URINE, CLEAN CATCH Performed at [facility], [place] [address], [place], [place] [address]    Special Requests   Final    NONE Performed at [facility], [place] [address], [place], [place] [address]    Culture MULTIPLE SPECIES PRESENT, SUGGEST RECOLLECTION (A)  Final   Report Status 10/28/2020 FINAL  Final  Culture, blood  (routine x 2)     Status: None (Preliminary result)   Collection Time: 10/27/20  9:47 AM   Specimen: BLOOD  Result Value Ref Range Status   Specimen Description   Final    BLOOD RIGHT ANTECUBITAL Performed at [facility] [address], [place], [place] [address]    Special Requests   Final    BOTTLES DRAWN AEROBIC AND ANAEROBIC Blood Culture results may not be optimal due to an excessive volume of blood received in culture bottles Performed at [facility] [address] [place], [place] [address]    Culture   Final    NO GROWTH 2 DAYS Performed at [place] [address], [place],  [address]    Report Status PENDING  Incomplete  Resp Panel by RT-PCR (Flu A&B, Covid) Nasopharyngeal Swab     Status: None   Collection Time: 10/27/20  9:47 AM   Specimen: Nasopharyngeal Swab; Nasopharyngeal(NP) swabs in vial transport medium  Result Value Ref Range Status   SARS Coronavirus 2 by RT PCR NEGATIVE NEGATIVE Final    Comment: (NOTE) SARS-CoV-2 target nucleic acids are NOT DETECTED.  The SARS-CoV-2 RNA is generally detectable in upper respiratory specimens during the acute phase of infection. The lowest concentration of SARS-CoV-2 viral copies this assay can detect is 138 copies/mL. A negative result does not preclude SARS-Cov-2 infection and should not be used as the sole basis for treatment or other patient management decisions. A negative result may occur with  improper specimen collection/handling, submission of specimen other than nasopharyngeal swab, presence of viral mutation(s) within the areas targeted by this assay, and inadequate number of viral copies(<138 copies/mL). A negative result must be combined with clinical observations, patient history, and epidemiological information. The expected result is Negative.  Fact Sheet for Patients:  EntrepreneurPulse.com.au  Fact Sheet for Healthcare  Providers:  IncredibleEmployment.be  This test is no t yet approved or cleared by the [place] FDA and  has been authorized for detection and/or diagnosis of SARS-CoV-2 by FDA under an Emergency Use Authorization (EUA). This EUA will remain  in  effect (meaning this test can be used) for the duration of the COVID-19 declaration under Section 564(b)(1) of the Act, 21 U.S.C.section 360bbb-3(b)(1), unless the authorization is terminated  or revoked sooner.       Influenza A by PCR NEGATIVE NEGATIVE Final   Influenza B by PCR NEGATIVE NEGATIVE Final    Comment: (NOTE) The Xpert Xpress SARS-CoV-2/FLU/RSV plus assay is intended as an aid in the diagnosis of influenza from Nasopharyngeal swab specimens and should not be used as a sole basis for treatment. Nasal washings and aspirates are unacceptable for Xpert Xpress SARS-CoV-2/FLU/RSV testing.  Fact Sheet for Patients: EntrepreneurPulse.com.au  Fact Sheet for Healthcare Providers: IncredibleEmployment.be  This test is not yet approved or cleared by the [place] FDA and has been authorized for detection and/or diagnosis of SARS-CoV-2 by FDA under an Emergency Use Authorization (EUA). This EUA will remain in effect (meaning this test can be used) for the duration of the COVID-19 declaration under Section 564(b)(1) of the Act, 21 U.S.C. section 360bbb-3(b)(1), unless the authorization is terminated or revoked.  Performed at [facility], [place] [address], [place], [place] [address]   Culture, blood (routine x 2)     Status: None (Preliminary result)   Collection Time: 10/27/20 11:26 AM   Specimen: BLOOD  Result Value Ref Range Status   Specimen Description   Final    BLOOD BLOOD LEFT HAND Performed at [facility] [address], [place], [place] [address]    Special Requests   Final    BOTTLES DRAWN AEROBIC AND ANAEROBIC Blood  Culture adequate volume Performed at [facility] [address], [place], [place] [address]    Culture  Setup Time   Final    GRAM NEGATIVE RODS ANAEROBIC BOTTLE ONLY Organism ID to follow CRITICAL RESULT CALLED TO, READ BACK BY AND VERIFIED WITH: L. Toni Amend, AT 5035 10/30/20 [place] Performed at [facility], [place] [address] [place], [place] [address]    Culture GRAM NEGATIVE RODS  Final   Report Status PENDING  Incomplete  Blood Culture ID Panel (Reflexed)     Status: Abnormal   Collection Time: 10/27/20 11:26 AM  Result Value Ref Range Status   Enterococcus faecalis NOT DETECTED NOT DETECTED Final   Enterococcus Faecium NOT DETECTED NOT DETECTED Final   Listeria monocytogenes NOT DETECTED NOT DETECTED Final   Staphylococcus species NOT DETECTED NOT DETECTED Final   Staphylococcus aureus (BCID) NOT DETECTED NOT DETECTED Final   Staphylococcus epidermidis NOT DETECTED NOT DETECTED Final   Staphylococcus lugdunensis NOT DETECTED NOT DETECTED Final   Streptococcus species NOT DETECTED NOT DETECTED Final   Streptococcus agalactiae NOT DETECTED NOT DETECTED Final   Streptococcus pneumoniae NOT DETECTED NOT DETECTED Final   Streptococcus pyogenes NOT DETECTED NOT DETECTED Final   A.calcoaceticus-baumannii NOT DETECTED NOT DETECTED Final   Bacteroides fragilis NOT DETECTED NOT DETECTED Final   Enterobacterales DETECTED (A) NOT DETECTED Final    Comment: Enterobacterales represent a large order of gram negative bacteria, not a single organism. CRITICAL RESULT CALLED TO, READ BACK BY AND VERIFIED WITH: L. POINDEXTER PHARMD, AT 1275 10/30/20 D. VANHOOK    Enterobacter cloacae complex NOT DETECTED NOT DETECTED Final   Escherichia coli NOT DETECTED NOT DETECTED Final   Klebsiella aerogenes NOT DETECTED NOT DETECTED Final   Klebsiella oxytoca NOT DETECTED NOT DETECTED Final   Klebsiella pneumoniae DETECTED (A) NOT DETECTED Final    Comment: CRITICAL  RESULT CALLED TO,  READ BACK BY AND VERIFIED WITH: L. POINDEXTER PHARMD, AT 9509 10/30/20 D. VANHOOK    Proteus species NOT DETECTED NOT DETECTED Final   Salmonella species NOT DETECTED NOT DETECTED Final   Serratia marcescens NOT DETECTED NOT DETECTED Final   Haemophilus influenzae NOT DETECTED NOT DETECTED Final   Neisseria meningitidis NOT DETECTED NOT DETECTED Final   Pseudomonas aeruginosa NOT DETECTED NOT DETECTED Final   Stenotrophomonas maltophilia NOT DETECTED NOT DETECTED Final   Candida albicans NOT DETECTED NOT DETECTED Final   Candida auris NOT DETECTED NOT DETECTED Final   Candida glabrata NOT DETECTED NOT DETECTED Final   Candida krusei NOT DETECTED NOT DETECTED Final   Candida parapsilosis NOT DETECTED NOT DETECTED Final   Candida tropicalis NOT DETECTED NOT DETECTED Final   Cryptococcus neoformans/gattii NOT DETECTED NOT DETECTED Final   CTX-M ESBL NOT DETECTED NOT DETECTED Final   Carbapenem resistance IMP NOT DETECTED NOT DETECTED Final   Carbapenem resistance KPC NOT DETECTED NOT DETECTED Final   Carbapenem resistance NDM NOT DETECTED NOT DETECTED Final   Carbapenem resist OXA 48 LIKE NOT DETECTED NOT DETECTED Final   Carbapenem resistance VIM NOT DETECTED NOT DETECTED Final    Comment: Performed at [facility], [address] [place], [place] [address]  Cath Tip Culture     Status: None (Preliminary result)   Collection Time: 10/27/20  3:44 PM   Specimen: Catheter Tip; Other  Result Value Ref Range Status   Specimen Description   Final    CATH TIP Performed at [facility] [address] [place], [place] [address]    Special Requests   Final    NONE Performed at [facility], [place] [address] [place], [place] [address]    Culture   Final    CULTURE REINCUBATED FOR BETTER GROWTH Performed at [facility], [place] [address], [place], [place] [address]    Report Status PENDING  Incomplete           Radiology Studies: No results found.      Scheduled Meds:  (feeding supplement) PROSource Plus  30 mL Oral TID BM   calcium-vitamin D  1 tablet Oral Q breakfast   multivitamin with minerals  1 tablet Oral Daily   pantoprazole  40 mg Oral Daily   rivaroxaban  20 mg Oral Q supper   sacubitril-valsartan  1 tablet Oral BID   spironolactone  25 mg Oral Daily   Teduglutide (rDNA)  3.1 application Subcutaneous q morning   triamcinolone ointment  1 application Topical BID   Continuous Infusions:  cefTRIAXone (ROCEPHIN)  IV 2 g (10/30/20 0532)          Aline August, MD [facility] 10/30/2020, 7:44 AM

## 2020-10-31 DIAGNOSIS — K912 Postsurgical malabsorption, not elsewhere classified: Secondary | ICD-10-CM | POA: Diagnosis not present

## 2020-10-31 DIAGNOSIS — I6529 Occlusion and stenosis of unspecified carotid artery: Secondary | ICD-10-CM

## 2020-10-31 DIAGNOSIS — R7881 Bacteremia: Secondary | ICD-10-CM | POA: Diagnosis not present

## 2020-10-31 LAB — CATH TIP CULTURE: Culture: 5000 — AB

## 2020-10-31 NOTE — Assessment & Plan Note (Signed)
-   Followed by GI.  On cyclical antibiotics

## 2020-10-31 NOTE — Assessment & Plan Note (Addendum)
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  She underwent right femoral vein CVL placement with IR on 11/01/2020.  IJ's were occluded bilaterally -Antibiotics de-escalated to Keflex per ID to complete total of 7 days at discharge

## 2020-10-31 NOTE — Hospital Course (Addendum)
[name] [name] is a 70 year old female with history of short gut syndrome (2/2 small intestine blood clot, 2006) now on chronic TPN (Mon-Thur), chronic normocytic anemia (now back on monthly Aranesp injections per hematology), thrombocytopenia (baseline ~100 K) Bilateral carotid artery stenosis (no intervention indicated per vascular), bacterial overgrowth syndrome, PICC line related bacteremia, allergic rhinosinusitis, brachial vein thrombosis, carnitine deficiency, chronic systolic CHF, osteoporosis, pancytopenia, CVA, thrombophilia, vitamin D deficiency, history of multiple long-term catheter placements was referred by her gastroenterologist to the ED on 10/27/2020 because of complaints of chills and sweats with subjective fevers with concern for bacteremia.    On presentation, she was afebrile with stable hemodynamics.  WBC of 7, platelets of 68, creatinine of 1.2.  She was empirically started on antibiotics.  ID was consulted.  She underwent removal of tunneled left IJ CVC by IR on 10/27/2020.  Blood cultures and cath tip culture grew Klebsiella. Repeat blood cultures remained negative at 48 hours.  She underwent right femoral vein central line placement with IR on 11/01/2020.  Both IJ's were occluded and not amenable for central line access. She was discharged home to resume home TPN at that time. She was de-escalated down to Keflex per ID to complete total of 7-day course at time of discharge.

## 2020-10-31 NOTE — Progress Notes (Signed)
PHARMACY - TOTAL PARENTERAL NUTRITION CONSULT NOTE   Indication:  chronic TPN, short gut syndrome  Patient Measurements: Height: 5\' 8"  (172.7 cm) Weight: 64.5 kg (142 lb 3.2 oz) IBW/kg (Calculated) : 63.9 TPN AdjBW (KG): 64.5 Body mass index is 21.62 kg/m.  Assessment: 70 yo female admitted on 6/18 for chills and sweats.  On chronic TPN for history of short gut syndrome.  Currently holding TPN due to gram-negative bacteremia-ID following and on appropriate antibiotics.  Tunneled left IJ CVC removed by IR on 10/27/20.  Planning to replace CVC 6/23 as long as blood cultures remain negative.  Glucose / Insulin: not currently checking CBGs (last 6/19 < 100); eating 100% of meals - cardiac diet Electrolytes: lytes wnl Renal: Scr 1.12 (appears to be near BL), BUN 13 Hepatic: AST/ALT wnl, alk phos wnl, prealbumin 11.8, albumin 2.5, TGs 135 Intake / Output; MIVF: UOP not quantified but x2 occurrences, +2.3 positive since admit, LBM 6/20, no MIVF GI Imaging: none this admit GI Surgeries / Procedures: none this admit  Central access: none currently, removed 6/18 TPN start date: PTA, held d/t bacteremia  Nutritional Goals (per RD recommendation on 6/19): kCal: 1900-2100, Protein: 95-110, Fluid: > 2.2 L  Current Nutrition:  Cardiac diet, feeding supplements TPN on hold currently   Plan:  F/u line replacement F/u ability to restart TPN  Dimple Nanas, PharmD PGY-1 Acute Care Pharmacy Resident 10/31/2020,12:05 PM

## 2020-10-31 NOTE — Assessment & Plan Note (Signed)
-   Stroke noted on 07/24/2020 with: Numerous small scattered acute cortical/subcortical infarcts within the right cerebral hemisphere, predominantly within the right MCA territory, as well as right MCA/ACA and right MCA/PCA watershed territories. Infarcts are present within the right frontal lobe, parietal lobe, temporal lobe and temporal occipital junction.  Additionally, there are several scattered tiny acute infarcts within the left frontal white matter within the left MCA/ACA watershed territory. The pattern of the above described infarcts is suggestive of an embolic process and clinical correlation is recommended."  - Followed by neurology - Does not appear to have any further residual deficits but noted to have some mild left-sided weakness when seen by neurology on 08/29/2020 - Remains on Xarelto for ongoing secondary prevention -Discussion regarding statin use with low LDL deferred to neurology outpatient

## 2020-10-31 NOTE — Assessment & Plan Note (Signed)
continue PPI

## 2020-10-31 NOTE — Assessment & Plan Note (Signed)
-   Baseline hemoglobin 8 to 9 g/dL

## 2020-10-31 NOTE — Assessment & Plan Note (Signed)
-   70% stenosis right carotid, 50% stenosis left carotid - Evaluated by vascular surgery on 09/25/2020, no indication for intervention at this time

## 2020-10-31 NOTE — Assessment & Plan Note (Signed)
-   Followed outpatient by GI

## 2020-10-31 NOTE — Assessment & Plan Note (Signed)
-   History of small bowel blood clot 2006 - Continue Xarelto

## 2020-10-31 NOTE — Progress Notes (Signed)
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, 2006) now on chronic TPN (Mon-Thur), chronic normocytic anemia (now back on monthly Aranesp injections per hematology), thrombocytopenia (baseline ~100 K) Bilateral carotid artery stenosis (no intervention indicated per vascular), bacterial overgrowth syndrome, PICC line related bacteremia, allergic rhinosinusitis, brachial vein thrombosis, carnitine deficiency, chronic systolic CHF, osteoporosis, pancytopenia, CVA, thrombophilia, vitamin D deficiency, history of multiple long-term catheter placements was referred by her gastroenterologist to the ED on 10/27/2020 because of complaints of chills and sweats with subjective fevers with concern for bacteremia.    On presentation, she was afebrile with stable hemodynamics.  WBC of 7, platelets of 68, creatinine of 1.2.  She was empirically started on antibiotics.  ID was consulted.  She underwent removal of tunneled left IJ CVC by IR on 10/27/2020.  Blood cultures and cath tip culture grew Klebsiella.  Interval History:  Sitting up in bed comfortably in no distress.  No events overnight.  We discussed timing of possible replacement of central line access for tomorrow or Friday.  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 Baseline creat ~ 1.2 - 1.3, eGFR 46-50   Thrombocytopenia (HCC) - Baseline 100 K per hematology - Follows with Dr. Gudena  History of CVA (cerebrovascular accident) - Stroke noted on 07/24/2020 with: Numerous small scattered acute cortical/subcortical infarcts within the right cerebral hemisphere, predominantly within the right MCA territory, as well as right MCA/ACA and right MCA/PCA watershed territories. Infarcts are present within the right frontal lobe, parietal lobe, temporal lobe and temporal occipital junction.   Additionally, there are several scattered tiny acute infarcts within the left frontal white matter within the left MCA/ACA watershed territory. The pattern of the above described infarcts is suggestive of an embolic process and clinical correlation is recommended."  - Followed by neurology - Does not appear to have any further residual deficits but noted to have some mild left-sided weakness when seen by neurology on 08/29/2020 - Remains on Xarelto for ongoing secondary prevention -Discussion regarding statin use with low LDL deferred to neurology outpatient  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HCC) - History of small bowel blood clot 2006 - Continue Xarelto  Chronic systolic heart failure (HCC) - no s/s exacerbation  - last echo 07/24/20: EF 35-40%, Gr 2 DD  Bacterial overgrowth syndrome - Followed by GI.  On cyclical   antibiotics  GERD (gastroesophageal reflux disease) -  continue PPI   Old records reviewed in assessment of this patient  Antimicrobials: Rocephin 6/21 >> current  DVT prophylaxis: Xarelto   Code Status:   Code Status: Full Code Family Communication:   Disposition Plan: Status is: Inpatient  Remains inpatient appropriate because:IV treatments appropriate due to intensity of illness or inability to take PO and Inpatient level of care appropriate due to severity of illness  Dispo: The patient is from: Home              Anticipated d/c is to: Home              Patient currently is not medically stable to d/c.   Difficult to place patient No   Risk of unplanned readmission score: Unplanned Admission- Pilot do not use: 16.89   Objective: Blood pressure (!) 106/57, pulse (!) 44, temperature 98.4 F (36.9 C), temperature source Oral, resp. rate 14, height 5' 8" (1.727 m), weight 64.5 kg, last menstrual period 02/26/2014, SpO2 100 %.  Examination: General appearance: alert, cooperative, and no distress Head: Normocephalic, without obvious abnormality, atraumatic Eyes:  EOMI Lungs: clear to auscultation bilaterally Heart: regular rate and rhythm and S1, S2 normal Abdomen: normal findings: bowel sounds normal and soft, non-tender Extremities:  no edema Skin: mobility and turgor normal Neurologic: Grossly normal  Consultants:  ID  Procedures:  Removal left CVL, 10/27/20  Data Reviewed: I have personally reviewed following labs and imaging studies No results found. However, due to the size of the patient record, not all encounters were searched. Please check Results Review for a complete set of results.  Recent Results (from the past 240 hour(s))  Culture, Blood     Status: None   Collection Time: 10/22/20  1:51 PM   Specimen: BLOOD  Result Value Ref Range Status   Specimen Description   Final    BLOOD Performed at [facility], [address] [place], [place] [address]    Special Requests   Final     NONE Performed at [facility], [place] [address], [place], [place] [address]    Culture   Final    NO GROWTH 5 DAYS Performed at [facility], [place] [address] [place], [place] [address]    Report Status 10/27/2020 FINAL  Final  Urine culture     Status: Abnormal   Collection Time: 10/27/20  9:13 AM   Specimen: Urine, Clean Catch  Result Value Ref Range Status   Specimen Description   Final    URINE, CLEAN CATCH Performed at [facility], [place] [address], [place], [place] [address]    Special Requests   Final    NONE Performed at [facility], [place] [address] [place], [place] [address]    Culture MULTIPLE SPECIES PRESENT, SUGGEST RECOLLECTION (A)  Final   Report Status 10/28/2020 FINAL  Final  Culture, blood (routine x 2)     Status: None (Preliminary result)   Collection Time: 10/27/20  9:47 AM   Specimen: BLOOD  Result Value Ref Range Status   Specimen Description   Final    BLOOD RIGHT ANTECUBITAL Performed at [place] [address], [place], [place] [address]    Special Requests   Final    BOTTLES DRAWN AEROBIC AND ANAEROBIC Blood Culture results may not be optimal due to an excessive volume of blood received in culture bottles Performed at  [facility], [place] [address], [place], [place] [address]    Culture   Final    NO GROWTH 4 DAYS Performed at [facility], [place] [address], [place], [place] [address]    Report Status PENDING  Incomplete  Resp Panel by RT-PCR (Flu A&B, Covid) Nasopharyngeal Swab     Status: None   Collection Time: 10/27/20  9:47 AM   Specimen: Nasopharyngeal Swab; Nasopharyngeal(NP) swabs in vial transport medium  Result Value Ref Range Status   SARS Coronavirus 2 by RT PCR NEGATIVE NEGATIVE Final    Comment: (NOTE) SARS-CoV-2 target nucleic acids are NOT DETECTED.  The SARS-CoV-2 RNA is generally detectable in upper  respiratory specimens during the acute phase of infection. The lowest concentration of SARS-CoV-2 viral copies this assay can detect is 138 copies/mL. A negative result does not preclude SARS-Cov-2 infection and should not be used as the sole basis for treatment or other patient management decisions. A negative result may occur with  improper specimen collection/handling, submission of specimen other than nasopharyngeal swab, presence of viral mutation(s) within the areas targeted by this assay, and inadequate number of viral copies(<138 copies/mL). A negative result must be combined with clinical observations, patient history, and epidemiological information. The expected result is Negative.  Fact Sheet for Patients:  EntrepreneurPulse.com.au  Fact Sheet for Healthcare Providers:  IncredibleEmployment.be  This test is no t yet approved or cleared by the [place] FDA and  has been authorized for detection and/or diagnosis of SARS-CoV-2 by FDA under an Emergency Use Authorization (EUA). This EUA will remain  in effect (meaning this test can be used) for the duration of the COVID-19 declaration under Section 564(b)(1) of the Act, 21 U.S.C.section 360bbb-3(b)(1), unless the authorization is terminated  or revoked sooner.       Influenza A by PCR NEGATIVE NEGATIVE Final   Influenza B by PCR NEGATIVE NEGATIVE Final    Comment: (NOTE) The Xpert Xpress SARS-CoV-2/FLU/RSV plus assay is intended as an aid in the diagnosis of influenza from Nasopharyngeal swab specimens and should not be used as a sole basis for treatment. Nasal washings and aspirates are unacceptable for Xpert Xpress SARS-CoV-2/FLU/RSV testing.  Fact Sheet for Patients: EntrepreneurPulse.com.au  Fact Sheet for Healthcare Providers: IncredibleEmployment.be  This test is not yet approved or cleared by the [place] FDA and has been  authorized for detection and/or diagnosis of SARS-CoV-2 by FDA under an Emergency Use Authorization (EUA). This EUA will remain in effect (meaning this test can be used) for the duration of the COVID-19 declaration under Section 564(b)(1) of the Act, 21 U.S.C. section 360bbb-3(b)(1), unless the authorization is terminated or revoked.  Performed at [facility], [place] [address], [place], [place] [address]   Culture, blood (routine x 2)     Status: Abnormal (Preliminary result)   Collection Time: 10/27/20 11:26 AM   Specimen: BLOOD  Result Value Ref Range Status   Specimen Description   Final    BLOOD BLOOD LEFT HAND Performed at [place] [address] [place], [place] [address]    Special Requests   Final    BOTTLES DRAWN AEROBIC AND ANAEROBIC Blood Culture adequate volume Performed at [place] [address] [place], [place] [address]    Culture  Setup Time   Final    GRAM NEGATIVE RODS ANAEROBIC BOTTLE ONLY CRITICAL RESULT CALLED TO, READ BACK BY AND VERIFIED WITH: L. POINDEXTER PHARMD, AT 0349 10/30/20 D.  VANHOOK    Culture (A)  Final    KLEBSIELLA PNEUMONIAE SUSCEPTIBILITIES TO FOLLOW Performed at [facility], [address] [place], [place] [address]    Report Status PENDING  Incomplete  Blood Culture ID Panel (Reflexed)     Status: Abnormal   Collection Time: 10/27/20 11:26 AM  Result Value Ref Range Status   Enterococcus faecalis NOT DETECTED NOT DETECTED Final   Enterococcus Faecium NOT DETECTED NOT DETECTED Final   Listeria monocytogenes NOT DETECTED NOT DETECTED Final   Staphylococcus species NOT DETECTED NOT DETECTED Final   Staphylococcus aureus (BCID) NOT DETECTED NOT DETECTED Final   Staphylococcus epidermidis NOT DETECTED NOT DETECTED Final   Staphylococcus lugdunensis NOT DETECTED NOT DETECTED Final   Streptococcus species NOT DETECTED NOT DETECTED Final   Streptococcus agalactiae  NOT DETECTED NOT DETECTED Final   Streptococcus pneumoniae NOT DETECTED NOT DETECTED Final   Streptococcus pyogenes NOT DETECTED NOT DETECTED Final   A.calcoaceticus-baumannii NOT DETECTED NOT DETECTED Final   Bacteroides fragilis NOT DETECTED NOT DETECTED Final   Enterobacterales DETECTED (A) NOT DETECTED Final    Comment: Enterobacterales represent a large order of gram negative bacteria, not a single organism. CRITICAL RESULT CALLED TO, READ BACK BY AND VERIFIED WITH: L. POINDEXTER PHARMD, AT 0258 10/30/20 D. VANHOOK    Enterobacter cloacae complex NOT DETECTED NOT DETECTED Final   Escherichia coli NOT DETECTED NOT DETECTED Final   Klebsiella aerogenes NOT DETECTED NOT DETECTED Final   Klebsiella oxytoca NOT DETECTED NOT DETECTED Final   Klebsiella pneumoniae DETECTED (A) NOT DETECTED Final    Comment: CRITICAL RESULT CALLED TO, READ BACK BY AND VERIFIED WITH: L. POINDEXTER PHARMD, AT 0258 10/30/20 D. VANHOOK    Proteus species NOT DETECTED NOT DETECTED Final   Salmonella species NOT DETECTED NOT DETECTED Final   Serratia marcescens NOT DETECTED NOT DETECTED Final   Haemophilus influenzae NOT DETECTED NOT DETECTED Final   Neisseria meningitidis NOT DETECTED NOT DETECTED Final   Pseudomonas aeruginosa NOT DETECTED NOT DETECTED Final   Stenotrophomonas maltophilia NOT DETECTED NOT DETECTED Final   Candida albicans NOT DETECTED NOT DETECTED Final   Candida auris NOT DETECTED NOT DETECTED Final   Candida glabrata NOT DETECTED NOT DETECTED Final   Candida krusei NOT DETECTED NOT DETECTED Final   Candida parapsilosis NOT DETECTED NOT DETECTED Final   Candida tropicalis NOT DETECTED NOT DETECTED Final   Cryptococcus neoformans/gattii NOT DETECTED NOT DETECTED Final   CTX-M ESBL NOT DETECTED NOT DETECTED Final   Carbapenem resistance IMP NOT DETECTED NOT DETECTED Final   Carbapenem resistance KPC NOT DETECTED NOT DETECTED Final   Carbapenem resistance NDM NOT DETECTED NOT DETECTED Final    Carbapenem resist OXA 48 LIKE NOT DETECTED NOT DETECTED Final   Carbapenem resistance VIM NOT DETECTED NOT DETECTED Final    Comment: Performed at [facility], [address] [place], [place] [address]  Cath Tip Culture     Status: Abnormal   Collection Time: 10/27/20  3:44 PM   Specimen: Catheter Tip; Other  Result Value Ref Range Status   Specimen Description   Final    CATH TIP Performed at [facility], [address] [place], [place] [address]    Special Requests   Final    NONE Performed at [facility], [address] [place],  [address]    Culture (A)  Final    5,000 COLONIES/mL KLEBSIELLA PNEUMONIAE 50,000 COLONIES/mL STAPHYLOCOCCUS EPIDERMIDIS    Report   Status 10/31/2020 FINAL  Final  Culture, blood (routine x 2)     Status: None (Preliminary result)   Collection Time: 10/30/20  7:41 AM   Specimen: BLOOD  Result Value Ref Range Status   Specimen Description   Final    BLOOD RIGHT ANTECUBITAL Performed at [facility], [address] [place], [place] [address]    Special Requests   Final    BOTTLES DRAWN AEROBIC AND ANAEROBIC Blood Culture adequate volume Performed at [facility], [address] [place], [place] [address]    Culture   Final    NO GROWTH < 24 HOURS Performed at [facility], [address] [place], [place] [address]    Report Status PENDING  Incomplete  Culture, blood (routine x 2)     Status: None (Preliminary result)   Collection Time: 10/30/20  7:47 AM   Specimen: BLOOD  Result Value Ref Range Status   Specimen Description   Final    BLOOD LEFT ANTECUBITAL Performed at [facility], [address] [place], [place] [address]    Special Requests   Final    BOTTLES DRAWN AEROBIC AND ANAEROBIC Blood Culture adequate volume Performed at [facility], [address] [place], [place] [address]    Culture    Final    NO GROWTH < 24 HOURS Performed at [facility], [address] [place], [place] [address]    Report Status PENDING  Incomplete     Radiology Studies: No results found. IR Radiologist Eval & Mgmt    (Results Pending)  IR Radiologist Eval & Mgmt    (Results Pending)    Scheduled Meds:  (feeding supplement) PROSource Plus  30 mL Oral TID BM   calcium-vitamin D  1 tablet Oral Q breakfast   multivitamin with minerals  1 tablet Oral Daily   pantoprazole  40 mg Oral Daily   sacubitril-valsartan  1 tablet Oral BID   spironolactone  25 mg Oral Daily   Teduglutide (rDNA)  3.1 application Subcutaneous q morning   triamcinolone ointment  1 application Topical BID   PRN Meds: acetaminophen **OR** acetaminophen, diphenhydrAMINE, fluticasone, furosemide, loratadine, ondansetron **OR** ondansetron (ZOFRAN) IV Continuous Infusions:  cefTRIAXone (ROCEPHIN)  IV Stopped (10/31/20 1149)     LOS: 2 days  Time spent: Greater than 50% of the 35 minute visit was spent in counseling/coordination of care for the patient as laid out in the A&P.    , MD [facility] 10/31/2020, 3:45 PM  

## 2020-10-31 NOTE — Assessment & Plan Note (Addendum)
-   no s/s exacerbation  - last echo 07/24/20: EF 35-40%, Gr 2 DD

## 2020-10-31 NOTE — Assessment & Plan Note (Signed)
-   Baseline 100 K per hematology - Follows with Dr. Lindi Adie

## 2020-10-31 NOTE — Assessment & Plan Note (Signed)
-  patient has history of CKD3a. Baseline creat ~ 1.2 - 1.3, eGFR 46-50

## 2020-10-31 NOTE — Assessment & Plan Note (Signed)
-   s/p multiple SB resections - remains on TPN at home [facility]

## 2020-11-01 ENCOUNTER — Inpatient Hospital Stay (HOSPITAL_COMMUNITY): Payer: Medicare Other

## 2020-11-01 DIAGNOSIS — R7881 Bacteremia: Secondary | ICD-10-CM | POA: Diagnosis not present

## 2020-11-01 HISTORY — PX: IR FLUORO GUIDE CV LINE RIGHT: IMG2283

## 2020-11-01 HISTORY — PX: IR US GUIDE VASC ACCESS LEFT: IMG2389

## 2020-11-01 HISTORY — PX: IR US GUIDE VASC ACCESS RIGHT: IMG2390

## 2020-11-01 HISTORY — PX: IR VENO/EXT/UNI LEFT: IMG675

## 2020-11-01 LAB — CULTURE, BLOOD (ROUTINE X 2)
Culture: NO GROWTH
Special Requests: ADEQUATE

## 2020-11-01 LAB — CBC WITH DIFFERENTIAL/PLATELET
Abs Immature Granulocytes: 0.03 10*3/uL (ref 0.00–0.07)
Basophils Absolute: 0.1 10*3/uL (ref 0.0–0.1)
Basophils Relative: 1 %
Eosinophils Absolute: 0.3 10*3/uL (ref 0.0–0.5)
Eosinophils Relative: 4 %
HCT: 32.7 % — ABNORMAL LOW (ref 36.0–46.0)
Hemoglobin: 9.8 g/dL — ABNORMAL LOW (ref 12.0–15.0)
Immature Granulocytes: 0 %
Lymphocytes Relative: 28 %
Lymphs Abs: 1.9 10*3/uL (ref 0.7–4.0)
MCH: 31.2 pg (ref 26.0–34.0)
MCHC: 30 g/dL (ref 30.0–36.0)
MCV: 104.1 fL — ABNORMAL HIGH (ref 80.0–100.0)
Monocytes Absolute: 0.7 10*3/uL (ref 0.1–1.0)
Monocytes Relative: 10 %
Neutro Abs: 3.8 10*3/uL (ref 1.7–7.7)
Neutrophils Relative %: 57 %
Platelets: 104 10*3/uL — ABNORMAL LOW (ref 150–400)
RBC: 3.14 MIL/uL — ABNORMAL LOW (ref 3.87–5.11)
RDW: 15.8 % — ABNORMAL HIGH (ref 11.5–15.5)
WBC: 6.7 10*3/uL (ref 4.0–10.5)
nRBC: 0 % (ref 0.0–0.2)

## 2020-11-01 LAB — BASIC METABOLIC PANEL
Anion gap: 3 — ABNORMAL LOW (ref 5–15)
BUN: 7 mg/dL — ABNORMAL LOW (ref 8–23)
CO2: 17 mmol/L — ABNORMAL LOW (ref 22–32)
Calcium: 8 mg/dL — ABNORMAL LOW (ref 8.9–10.3)
Chloride: 119 mmol/L — ABNORMAL HIGH (ref 98–111)
Creatinine, Ser: 1.23 mg/dL — ABNORMAL HIGH (ref 0.44–1.00)
GFR, Estimated: 47 mL/min — ABNORMAL LOW (ref >60)
Glucose, Bld: 75 mg/dL (ref 70–99)
Potassium: 3.1 mmol/L — ABNORMAL LOW (ref 3.5–5.1)
Sodium: 139 mmol/L (ref 135–145)

## 2020-11-01 LAB — MAGNESIUM: Magnesium: 1.8 mg/dL (ref 1.7–2.4)

## 2020-11-01 LAB — PHOSPHORUS: Phosphorus: 4.1 mg/dL (ref 2.5–4.6)

## 2020-11-01 MED ORDER — CEPHALEXIN 500 MG PO CAPS: 500.0000 mg | ORAL_CAPSULE | Freq: Four times a day (QID) | ORAL | Status: DC

## 2020-11-01 MED ORDER — HEPARIN SOD (PORK) LOCK FLUSH 100 UNIT/ML IV SOLN
INTRAVENOUS | Status: AC
  Filled 2020-11-01: qty 5

## 2020-11-01 MED ORDER — POTASSIUM CHLORIDE CRYS ER 20 MEQ PO TBCR
40.0000 meq | EXTENDED_RELEASE_TABLET | Freq: Once | ORAL | Status: AC
  Administered 2020-11-01: 40 meq via ORAL
  Filled 2020-11-01: qty 2

## 2020-11-01 MED ORDER — LIDOCAINE HCL 1 % IJ SOLN
INTRAMUSCULAR | Status: AC
  Administered 2020-11-01: 10 mL
  Filled 2020-11-01: qty 20

## 2020-11-01 MED ORDER — LIDOCAINE HCL 1 % IJ SOLN
INTRAMUSCULAR | Status: AC
  Administered 2020-11-01: 5 mL
  Filled 2020-11-01: qty 20

## 2020-11-01 MED ORDER — POTASSIUM CHLORIDE 10 MEQ/100ML IV SOLN
10.0000 meq | INTRAVENOUS | Status: DC
  Filled 2020-11-01: qty 100

## 2020-11-01 MED ORDER — MAGNESIUM SULFATE 2 GM/50ML IV SOLN
2.0000 g | Freq: Once | INTRAVENOUS | Status: AC
  Administered 2020-11-01: 2 g via INTRAVENOUS
  Filled 2020-11-01: qty 50

## 2020-11-01 MED ORDER — HEPARIN SOD (PORK) LOCK FLUSH 100 UNIT/ML IV SOLN
INTRAVENOUS | Status: AC
  Administered 2020-11-01: 500 [IU]
  Filled 2020-11-01: qty 5

## 2020-11-01 MED ORDER — CEPHALEXIN 500 MG PO CAPS: 500.0000 mg | ORAL_CAPSULE | Freq: Four times a day (QID) | ORAL | 0 refills | Status: AC

## 2020-11-01 NOTE — TOC Progression Note (Signed)
Transition of Care [facility]) - Progression Note    Patient Details  Name: [name] [name] MRN: [id number] Date of Birth: [birth year]  Transition of [facility]) CM/SW Contact  Purcell Mouton, RN Phone Number: 11/01/2020, 4:23 PM  Clinical Narrative:      Spoke with pt and Jeffries at [facility] concerning IV ABX and TPN. Script will need to be faxed..        Expected Discharge Plan and Services           Expected Discharge Date: 11/01/20                                     Social Determinants of Health (SDOH) Interventions    Readmission Risk Interventions No flowsheet data found.

## 2020-11-01 NOTE — Progress Notes (Signed)
PHARMACY - TOTAL PARENTERAL NUTRITION CONSULT NOTE   Indication:  chronic TPN, short gut syndrome  Patient Measurements: Height: 5\' 8"  (172.7 cm) Weight: 64.5 kg (142 lb 3.2 oz) IBW/kg (Calculated) : 63.9 TPN AdjBW (KG): 64.5 Body mass index is 21.62 kg/m.  Assessment: 70 yo female admitted on 6/18 for chills and sweats.  On chronic TPN for history of short gut syndrome.  Currently holding TPN due to gram-negative bacteremia-ID following and on appropriate antibiotics.  Tunneled left IJ CVC removed by IR on 10/27/20.  Per ID, ok to replace PICC line today given blood cultures have remained negative.   Glucose / Insulin: not currently checking CBGs (last 6/19 < 100); eating 100% of meals - cardiac diet Electrolytes: lytes wnl Renal: Scr 1.12 (appears to be near BL), BUN 13 Hepatic: AST/ALT wnl, alk phos wnl, prealbumin 11.8, albumin 2.5, TGs 135 Intake / Output; MIVF: UOP not quantified but x2 occurrences, +2.3 positive since admit, LBM 6/20, no MIVF GI Imaging: none this admit GI Surgeries / Procedures: none this admit  Central access: none currently, removed 6/18 TPN start date: PTA, held d/t bacteremia  Nutritional Goals (per RD recommendation on 6/19): kCal: 1900-2100, Protein: 95-110g, Fluid: > 2.2 L Home TPN: 1800 mL 4 times weekly on Monday, Tuesday, Wednesday, Thursdays  300g CHO, 80g AA, 65g ILE  Current Nutrition:  Cardiac diet, feeding supplements TPN on hold currently   Plan:  Discussed with MD, replacing PICC line today and then will be discharged home Will not order TPN for inpatient  Dimple Nanas, PharmD PGY-1 Acute Care Pharmacy Resident 11/01/2020,11:20 AM

## 2020-11-01 NOTE — Discharge Summary (Signed)
Physician Discharge Summary   [name] [name] GYI:[id number] DOB: [birth year] DOA: 10/27/2020  PCP: Gatha Mayer, MD  Admit date: 10/27/2020 Discharge date: 11/01/2020  Admitted From: home Disposition:  home Discharging physician: Dwyane Dee, MD  Recommendations for Outpatient Follow-up:  Follow up with neurosurgery (already planned referral prior to admission) Continue following with neurology and hematology  Home Health:  Equipment/Devices:   Patient discharged to home in Discharge Condition: stable Risk of unplanned readmission score: Unplanned Admission- Pilot do not use: 17.17  CODE STATUS: Full Diet recommendation:  Diet Orders (From admission, onward)     Start     Ordered   11/01/20 0000  Diet general        11/01/20 1609   10/30/20 0921  Diet regular Room service appropriate? Yes; Fluid consistency: Thin  Diet effective now       Question Answer Comment  Room service appropriate? 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 (Mon-Thur), chronic normocytic anemia (now back on monthly Aranesp injections per hematology), thrombocytopenia (baseline ~100 K) Bilateral carotid artery stenosis (no intervention indicated per vascular), bacterial overgrowth syndrome, PICC line related bacteremia, allergic rhinosinusitis, brachial vein thrombosis, carnitine deficiency, chronic systolic CHF, osteoporosis, pancytopenia, CVA, thrombophilia, vitamin D deficiency, history of multiple long-term catheter placements was referred by her gastroenterologist to the ED on 10/27/2020 because of complaints of chills and sweats with subjective fevers with concern for bacteremia.    On presentation, she was afebrile with stable hemodynamics.  WBC of 7, platelets of 68, creatinine of 1.2.  She was empirically started on antibiotics.  ID  was consulted.  She underwent removal of tunneled left IJ CVC by IR on 10/27/2020.  Blood cultures and cath tip culture grew Klebsiella. Repeat blood cultures remained negative at 48 hours.  She underwent right femoral vein central line placement with IR on 11/01/2020.  Both IJ's were occluded and not amenable for central line access. She was discharged home to resume home TPN at that time. She was de-escalated down to Keflex per ID to complete total of 7-day course at time of discharge.  * Bacteremia - History of recurrent central line infections due to need for ongoing TPN at home - Left tunneled IJ removed on 10/27/2020 - Cultures growing Klebsiella in blood and cath tip culture -ID following, appreciate assistance -Repeat cultures remain negative at 48 hours.  She underwent right femoral vein CVL placement with IR on 11/01/2020.  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 Baseline creat ~ 1.2 - 1.3, eGFR 46-50   Thrombocytopenia (HCC) - Baseline 100 K per hematology - Follows with Dr. Lindi Adie  History of CVA (cerebrovascular accident) - Stroke noted on 07/24/2020 with: Numerous small scattered acute cortical/subcortical infarcts within the  right cerebral hemisphere, predominantly within the right MCA territory, as well as right MCA/ACA and right MCA/PCA watershed territories. Infarcts are present within the right frontal lobe, parietal lobe, temporal lobe  and temporal occipital junction.   Additionally, there are several scattered tiny acute infarcts within the left frontal white matter within the left MCA/ACA watershed territory. 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  On cyclical antibiotics  GERD (gastroesophageal reflux disease) - continue PPI    The patient's chronic medical conditions were treated accordingly per the patient's home medication regimen except as noted.  On day of discharge, patient was felt deemed stable for discharge. Patient/family member advised to call PCP or come back to ER if needed.   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    Discharge Instructions     Diet general   Complete by: As directed    Increase activity slowly   Complete by: As directed       Allergies as of 11/01/2020       Reactions   Penicillins Itching, Swelling, Other (See Comments)   Reaction:  Facial swelling Has patient had a PCN reaction causing immediate rash, facial/tongue/throat swelling, SOB or lightheadedness with hypotension: Yes Has patient had a PCN reaction causing severe rash involving mucus membranes or skin necrosis: No Has patient had a PCN reaction that required hospitalization No Has patient had a PCN reaction occurring within the last 10 years: No If all of the above answers are "NO", then may proceed with Cephalosporin use.        Medication List     TAKE these medications    acetaminophen 500 MG tablet Commonly known as: TYLENOL Take 1,000 mg by mouth daily as needed for mild pain.   ADULT GUMMY PO Take 2 tablets by mouth daily.   ADULT TPN Inject 1,800 mLs into the vein 4 (four) times a week. Pt receives home TPN from [facility]:  1800 mL bag, four nights weekly (Monday, Tuesday, Wednesday, Thursday for 8 hours (includes 1 hour taper up and down).   baclofen 10 MG tablet Commonly known as: LIORESAL Take 1 tablet (10 mg total) by mouth 2 (two) times daily as needed for muscle spasms.   CALCIUM + D3 PO Take 2 tablets  by mouth daily.   cephALEXin 500 MG capsule Commonly known as: KEFLEX Take 1 capsule (500 mg total) by mouth every 6 (six) hours for 16 doses. Start taking on: November 02, 2020   cetirizine 10 MG tablet Commonly known as: ZYRTEC Take 10 mg by mouth at bedtime as needed for allergies.   diphenhydrAMINE 25 mg capsule Commonly known as: BENADRYL Take 25-50 mg by mouth every 6 (six) hours as needed for itching or sleep.   Entresto 24-26 MG Generic drug: sacubitril-valsartan Take 1 tablet by mouth 2 (two) times daily.   fluticasone 50 MCG/ACT nasal spray Commonly known  as: FLONASE Place 2 sprays into both nostrils daily as needed for allergies or rhinitis.   furosemide 20 MG tablet Commonly known as: LASIX TAKE ONE TABLET BY MOUTH AS NEEDED FOR SWELLING What changed: See the new instructions.   HEPARIN LOCK FLUSH IJ Inject 5 mLs as directed 4 (four) times a week. Monday, Tuesday, Wednesday, Thursday in the morning with TPN   omeprazole 40 MG capsule Commonly known as: PRILOSEC Take 40 mg by mouth daily.   ondansetron 8 MG disintegrating tablet Commonly known as: ZOFRAN-ODT TAKE 1 TABLET (8 MG TOTAL) BY MOUTH EVERY 8 (EIGHT) HOURS AS NEEDED FOR NAUSEA OR VOMITING.   sodium chloride 0.9 % infusion Inject 900 mLs into the vein every 14 (fourteen) days. Uses for TPN   spironolactone 25 MG tablet Commonly known as: ALDACTONE Take 1 tablet (25 mg total) by mouth daily.   Teduglutide (rDNA) 5 MG Kit Inject 3.1 application into the skin every morning.   triamcinolone ointment 0.1 % Commonly known as: KENALOG Apply 1 application topically 2 (two) times daily.   vitamin E 1000 UNIT capsule Take 1,000 Units by mouth daily.   Xarelto 20 MG Tabs tablet Generic drug: rivaroxaban TAKE 1 TABLET (20 MG TOTAL) BY MOUTH DAILY WITH SUPPER. What changed: how much to take        Allergies  Allergen Reactions   Penicillins Itching, Swelling and Other (See Comments)    Reaction:  Facial swelling Has patient had a PCN reaction causing immediate rash, facial/tongue/throat swelling, SOB or lightheadedness with hypotension: Yes Has patient had a PCN reaction causing severe rash involving mucus membranes or skin necrosis: No Has patient had a PCN reaction that required hospitalization No Has patient had a PCN reaction occurring within the last 10 years: No If all of the above answers are "NO", then may proceed with Cephalosporin use.    Consultations: ID  Discharge Exam: BP (!) 103/56 (BP Location: Right Arm)   Pulse (!) 59   Temp 98 F (36.7 C)  (Oral)   Resp 17   Ht _0  (1.727 m)   Wt 64.5 kg   LMP 02/26/2014   SpO2 100%   BMI 21.62 kg/m  General appearance: alert, cooperative, and no distress Head: Normocephalic, without obvious abnormality, atraumatic Eyes:  EOMI Lungs: clear to auscultation bilaterally Heart: regular rate and rhythm and S1, S2 normal Abdomen: normal findings: bowel sounds normal and soft, non-tender Extremities:  no edema Skin: mobility and turgor normal Neurologic: Grossly normal  The results of significant diagnostics from this hospitalization (including imaging, microbiology, ancillary and laboratory) are listed below for reference.   Microbiology: Recent Results (from the past 240 hour(s))  Urine culture     Status: Abnormal   Collection Time: 10/27/20  9:13 AM   Specimen: Urine, Clean Catch  Result Value Ref Range Status   Specimen Description  Final    URINE, CLEAN CATCH Performed at [facility], [place] [address] [place], [place] [address]    Special Requests   Final    NONE Performed at [facility], [place] [address], [place], [place] [address]    Culture MULTIPLE SPECIES PRESENT, SUGGEST RECOLLECTION (A)  Final   Report Status 10/28/2020 FINAL  Final  Culture, blood (routine x 2)     Status: None   Collection Time: 10/27/20  9:47 AM   Specimen: BLOOD  Result Value Ref Range Status   Specimen Description   Final    BLOOD RIGHT ANTECUBITAL Performed at [facility] [address], [place], [place] [address]    Special Requests   Final    BOTTLES DRAWN AEROBIC AND ANAEROBIC Blood Culture results may not be optimal due to an excessive volume of blood received in culture bottles Performed at [place] [address], [place], [place] [address]    Culture   Final    NO GROWTH 5 DAYS Performed at [facility], [place] [address], [place], [place] [address]    Report Status 11/01/2020 FINAL  Final  Resp  Panel by RT-PCR (Flu A&B, Covid) Nasopharyngeal Swab     Status: None   Collection Time: 10/27/20  9:47 AM   Specimen: Nasopharyngeal Swab; Nasopharyngeal(NP) swabs in vial transport medium  Result Value Ref Range Status   SARS Coronavirus 2 by RT PCR NEGATIVE NEGATIVE Final    Comment: (NOTE) SARS-CoV-2 target nucleic acids are NOT DETECTED.  The SARS-CoV-2 RNA is generally detectable in upper respiratory specimens during the acute phase of infection. The lowest concentration of SARS-CoV-2 viral copies this assay can detect is 138 copies/mL. A negative result does not preclude SARS-Cov-2 infection and should not be used as the sole basis for treatment or other patient management decisions. A negative result may occur with  improper specimen collection/handling, submission of specimen other than nasopharyngeal swab, presence of viral mutation(s) within the areas targeted by this assay, and inadequate number of viral copies(<138 copies/mL). A negative result must be combined with clinical observations, patient history, and epidemiological information. The expected result is Negative.  Fact Sheet for Patients:  EntrepreneurPulse.com.au  Fact Sheet for Healthcare Providers:  IncredibleEmployment.be  This test is no t yet approved or cleared by the [place] FDA and  has been authorized for detection and/or diagnosis of SARS-CoV-2 by FDA under an Emergency Use Authorization (EUA). This EUA will remain  in effect (meaning this test can be used) for the duration of the COVID-19 declaration under Section 564(b)(1) of the Act, 21 U.S.C.section 360bbb-3(b)(1), unless the authorization is terminated  or revoked sooner.       Influenza A by PCR NEGATIVE NEGATIVE Final   Influenza B by PCR NEGATIVE NEGATIVE Final    Comment: (NOTE) The Xpert Xpress SARS-CoV-2/FLU/RSV plus assay is intended as an aid in the diagnosis of influenza from Nasopharyngeal  swab specimens and should not be used as a sole basis for treatment. Nasal washings and aspirates are unacceptable for Xpert Xpress SARS-CoV-2/FLU/RSV testing.  Fact Sheet for Patients: EntrepreneurPulse.com.au  Fact Sheet for Healthcare Providers: IncredibleEmployment.be  This test is not yet approved or cleared by the [place] FDA and has been authorized for detection and/or diagnosis of SARS-CoV-2 by FDA under an Emergency Use Authorization (EUA). This EUA will remain in effect (meaning this test can be used) for the duration of the COVID-19 declaration under  Section 564(b)(1) of the Act, 21 U.S.C. section 360bbb-3(b)(1), unless the authorization is terminated or revoked.  Performed at [facility], [place] [address] [place], [place] [address]   Culture, blood (routine x 2)     Status: Abnormal   Collection Time: 10/27/20 11:26 AM   Specimen: BLOOD  Result Value Ref Range Status   Specimen Description   Final    BLOOD BLOOD LEFT HAND Performed at [place] [address] [place], [place] [address]    Special Requests   Final    BOTTLES DRAWN AEROBIC AND ANAEROBIC Blood Culture adequate volume Performed at [facility] [address] [place], [place] [address]    Culture  Setup Time   Final    GRAM NEGATIVE RODS ANAEROBIC BOTTLE ONLY CRITICAL RESULT CALLED TO, READ BACK BY AND VERIFIED WITH: L. Toni Amend, AT 9407 10/30/20 [place] Performed at [facility], [place] [address], [place], [place] [address]    Culture KLEBSIELLA PNEUMONIAE (A)  Final   Report Status 11/01/2020 FINAL  Final   Organism ID, Bacteria KLEBSIELLA PNEUMONIAE  Final      Susceptibility   Klebsiella pneumoniae - MIC*    AMPICILLIN >=32 RESISTANT Resistant     CEFAZOLIN <=4 SENSITIVE Sensitive     CEFEPIME <=0.12 SENSITIVE Sensitive     CEFTAZIDIME <=1 SENSITIVE Sensitive      CEFTRIAXONE <=0.25 SENSITIVE Sensitive     CIPROFLOXACIN <=0.25 SENSITIVE Sensitive     GENTAMICIN <=1 SENSITIVE Sensitive     IMIPENEM <=0.25 SENSITIVE Sensitive     TRIMETH/SULFA <=20 SENSITIVE Sensitive     AMPICILLIN/SULBACTAM 4 SENSITIVE Sensitive     PIP/TAZO <=4 SENSITIVE Sensitive     * KLEBSIELLA PNEUMONIAE  Blood Culture ID Panel (Reflexed)     Status: Abnormal   Collection Time: 10/27/20 11:26 AM  Result Value Ref Range Status   Enterococcus faecalis NOT DETECTED NOT DETECTED Final   Enterococcus Faecium NOT DETECTED NOT DETECTED Final   Listeria monocytogenes NOT DETECTED NOT DETECTED Final   Staphylococcus species NOT DETECTED NOT DETECTED Final   Staphylococcus aureus (BCID) NOT DETECTED NOT DETECTED Final   Staphylococcus epidermidis NOT DETECTED NOT DETECTED Final   Staphylococcus lugdunensis NOT DETECTED NOT DETECTED Final   Streptococcus species NOT DETECTED NOT DETECTED Final   Streptococcus agalactiae NOT DETECTED NOT DETECTED Final   Streptococcus pneumoniae NOT DETECTED NOT DETECTED Final   Streptococcus pyogenes NOT DETECTED NOT DETECTED Final   A.calcoaceticus-baumannii NOT DETECTED NOT DETECTED Final   Bacteroides fragilis NOT DETECTED NOT DETECTED Final   Enterobacterales DETECTED (A) NOT DETECTED Final    Comment: Enterobacterales represent a large order of gram negative bacteria, not a single organism. CRITICAL RESULT CALLED TO, READ BACK BY AND VERIFIED WITH: L. POINDEXTER PHARMD, AT 1103 10/30/20 D. VANHOOK    Enterobacter cloacae complex NOT DETECTED NOT DETECTED Final   Escherichia coli NOT DETECTED NOT DETECTED Final   Klebsiella aerogenes NOT DETECTED NOT DETECTED Final   Klebsiella oxytoca NOT DETECTED NOT DETECTED Final   Klebsiella pneumoniae DETECTED (A) NOT DETECTED Final    Comment: CRITICAL RESULT CALLED TO, READ BACK BY AND VERIFIED WITH: L. POINDEXTER PHARMD, AT 1594 10/30/20 D. VANHOOK    Proteus species NOT DETECTED NOT DETECTED Final    Salmonella species NOT DETECTED NOT DETECTED Final   Serratia marcescens NOT DETECTED NOT DETECTED Final   Haemophilus influenzae NOT DETECTED NOT DETECTED Final   Neisseria meningitidis NOT  DETECTED NOT DETECTED Final   Pseudomonas aeruginosa NOT DETECTED NOT DETECTED Final   Stenotrophomonas maltophilia NOT DETECTED NOT DETECTED Final   Candida albicans NOT DETECTED NOT DETECTED Final   Candida auris NOT DETECTED NOT DETECTED Final   Candida glabrata NOT DETECTED NOT DETECTED Final   Candida krusei NOT DETECTED NOT DETECTED Final   Candida parapsilosis NOT DETECTED NOT DETECTED Final   Candida tropicalis NOT DETECTED NOT DETECTED Final   Cryptococcus neoformans/gattii NOT DETECTED NOT DETECTED Final   CTX-M ESBL NOT DETECTED NOT DETECTED Final   Carbapenem resistance IMP NOT DETECTED NOT DETECTED Final   Carbapenem resistance KPC NOT DETECTED NOT DETECTED Final   Carbapenem resistance NDM NOT DETECTED NOT DETECTED Final   Carbapenem resist OXA 48 LIKE NOT DETECTED NOT DETECTED Final   Carbapenem resistance VIM NOT DETECTED NOT DETECTED Final    Comment: Performed at [facility], [address] [place], [place] [address]  Cath Tip Culture     Status: Abnormal   Collection Time: 10/27/20  3:44 PM   Specimen: Catheter Tip; Other  Result Value Ref Range Status   Specimen Description   Final    CATH TIP Performed at [facility] [address], [place], [place] [address]    Special Requests   Final    NONE Performed at [facility], [place] [address] [place], [place] [address]    Culture (A)  Final    5,000 COLONIES/mL KLEBSIELLA PNEUMONIAE 50,000 COLONIES/mL STAPHYLOCOCCUS EPIDERMIDIS    Report Status 10/31/2020 FINAL  Final  Culture, blood (routine x 2)     Status: None (Preliminary result)   Collection Time: 10/30/20  7:41 AM   Specimen: BLOOD  Result Value Ref Range Status   Specimen Description   Final    BLOOD RIGHT  ANTECUBITAL Performed at [facility] [address] [place], [place] [address]    Special Requests   Final    BOTTLES DRAWN AEROBIC AND ANAEROBIC Blood Culture adequate volume Performed at [place] [address], [place], [place] [address]    Culture   Final    NO GROWTH 2 DAYS Performed at [facility] [address], [place], [place] [address]    Report Status PENDING  Incomplete  Culture, blood (routine x 2)     Status: None (Preliminary result)   Collection Time: 10/30/20  7:47 AM   Specimen: BLOOD  Result Value Ref Range Status   Specimen Description   Final    BLOOD LEFT ANTECUBITAL Performed at [facility] [address], [place], [place] [address]    Special Requests   Final    BOTTLES DRAWN AEROBIC AND ANAEROBIC Blood Culture adequate volume Performed at [facility] [address] [place], [place] [address]    Culture   Final    NO GROWTH 2 DAYS Performed at [facility] [address], [place],  [address]    Report Status PENDING  Incomplete     Labs: BNP (last 3 results) No results for input(s): BNP in the last 8760 hours. Basic Metabolic Panel: Recent Labs  Lab 10/27/20 0947 10/28/20 0447 10/29/20 0447 11/01/20 0409  NA 136 137 138 139  K 3.5 3.4* 3.8 3.1*  CL 108 113* 117* 119*  CO2 22 18* 17* 17*  GLUCOSE 91 74 78 75  BUN 31* 23 13 7*  CREATININE 1.20* 1.31* 1.12* 1.23*  CALCIUM 8.1* 7.8* 8.1* 8.0*  MG  --  1.9 2.0 1.8  PHOS  --  3.9 3.5 4.1   Liver Function Tests: Recent Labs  Lab 10/27/20 0947 10/28/20 0447 10/29/20 0447  AST _0 ALT _1 ALKPHOS 89 77 87  BILITOT 0.6 0.3 0.5  PROT 6.9 5.9* 6.4*  ALBUMIN 2.8* 2.3* 2.5*   No results for input(s): LIPASE, AMYLASE in the last 168 hours. No results for input(s): AMMONIA in the last 168 hours. CBC: Recent Labs  Lab 10/27/20 0947 10/28/20 0447 10/29/20 0447  10/30/20 0429 11/01/20 0409  WBC 7.0 4.6 4.4 5.3 6.7  NEUTROABS 4.3 2.3  --  2.5 3.8  HGB 8.6* 8.0* 8.2* 8.7* 9.8*  HCT 28.0* 26.2* 27.3* 30.7* 32.7*  MCV 101.1* 101.9* 103.0* 107.0* 104.1*  PLT 68* 62* 76* 84* 104*   Cardiac Enzymes: No results for input(s): CKTOTAL, CKMB, CKMBINDEX, TROPONINI in the last 168 hours. BNP: Invalid input(s): POCBNP CBG: No results for input(s): GLUCAP in the last 168 hours. D-Dimer No results for input(s): DDIMER in the last 72 hours. Hgb A1c No results for input(s): HGBA1C in the last 72 hours. Lipid Profile No results for input(s): CHOL, HDL, LDLCALC, TRIG, CHOLHDL, LDLDIRECT in the last 72 hours. Thyroid function studies No results for input(s): TSH, T4TOTAL, T3FREE, THYROIDAB in the last 72 hours.  Invalid input(s): FREET3 Anemia work up No results for input(s): VITAMINB12, FOLATE, FERRITIN, TIBC, IRON, RETICCTPCT in the last 72 hours. Urinalysis    Component Value Date/Time   COLORURINE YELLOW 10/27/2020 0913   APPEARANCEUR CLEAR 10/27/2020 0913   LABSPEC 1.011 10/27/2020 0913   PHURINE 5.0 10/27/2020 0913   GLUCOSEU NEGATIVE 10/27/2020 0913   HGBUR NEGATIVE 10/27/2020 0913   HGBUR trace-intact 04/12/2010 0958   BILIRUBINUR NEGATIVE 10/27/2020 0913   BILIRUBINUR neg 04/08/2011 0947   KETONESUR NEGATIVE 10/27/2020 0913   PROTEINUR NEGATIVE 10/27/2020 0913   UROBILINOGEN 0.2 09/17/2012 1122   NITRITE NEGATIVE 10/27/2020 0913   LEUKOCYTESUR NEGATIVE 10/27/2020 0913   Sepsis Labs Invalid input(s): PROCALCITONIN,  WBC,  LACTICIDVEN Microbiology Recent Results (from the past 240 hour(s))  Urine culture     Status: Abnormal   Collection Time: 10/27/20  9:13 AM   Specimen: Urine, Clean Catch  Result Value Ref Range Status   Specimen Description   Final    URINE, CLEAN CATCH Performed at [facility], [place] [address] [place], [place] [address]    Special Requests   Final    NONE Performed at [facility], [place] [address], [place], [place] [address]    Culture MULTIPLE SPECIES PRESENT, SUGGEST RECOLLECTION (A)  Final   Report Status 10/28/2020 FINAL  Final  Culture, blood (routine x 2)     Status: None   Collection Time: 10/27/20  9:47 AM   Specimen: BLOOD  Result Value Ref Range Status   Specimen Description   Final    BLOOD RIGHT ANTECUBITAL Performed at [facility] [address] [place] [address], [place], [place] [address]    Special Requests   Final    BOTTLES DRAWN AEROBIC AND ANAEROBIC Blood Culture results may not be optimal due to an excessive volume of blood received in culture bottles Performed at [facility] [address] [place], [place] [address]    Culture   Final    NO GROWTH 5 DAYS Performed at [facility], [place] [address], [place], [place] [address]  Report Status 11/01/2020 FINAL  Final  Resp Panel by RT-PCR (Flu A&B, Covid) Nasopharyngeal Swab     Status: None   Collection Time: 10/27/20  9:47 AM   Specimen: Nasopharyngeal Swab; Nasopharyngeal(NP) swabs in vial transport medium  Result Value Ref Range Status   SARS Coronavirus 2 by RT PCR NEGATIVE NEGATIVE Final    Comment: (NOTE) SARS-CoV-2 target nucleic acids are NOT DETECTED.  The SARS-CoV-2 RNA is generally detectable in upper respiratory specimens during the acute phase of infection. The lowest concentration of SARS-CoV-2 viral copies this assay can detect is 138 copies/mL. A negative result does not preclude SARS-Cov-2 infection and should not be used as the sole basis for treatment or other patient management decisions. A negative result may occur with  improper specimen collection/handling, submission of specimen other than nasopharyngeal swab, presence of viral mutation(s) within the areas targeted by this assay, and inadequate number of viral copies(<138 copies/mL). A negative result must be combined with clinical observations, patient history,  and epidemiological information. The expected result is Negative.  Fact Sheet for Patients:  EntrepreneurPulse.com.au  Fact Sheet for Healthcare Providers:  IncredibleEmployment.be  This test is no t yet approved or cleared by the [place] FDA and  has been authorized for detection and/or diagnosis of SARS-CoV-2 by FDA under an Emergency Use Authorization (EUA). This EUA will remain  in effect (meaning this test can be used) for the duration of the COVID-19 declaration under Section 564(b)(1) of the Act, 21 U.S.C.section 360bbb-3(b)(1), unless the authorization is terminated  or revoked sooner.       Influenza A by PCR NEGATIVE NEGATIVE Final   Influenza B by PCR NEGATIVE NEGATIVE Final    Comment: (NOTE) The Xpert Xpress SARS-CoV-2/FLU/RSV plus assay is intended as an aid in the diagnosis of influenza from Nasopharyngeal swab specimens and should not be used as a sole basis for treatment. Nasal washings and aspirates are unacceptable for Xpert Xpress SARS-CoV-2/FLU/RSV testing.  Fact Sheet for Patients: EntrepreneurPulse.com.au  Fact Sheet for Healthcare Providers: IncredibleEmployment.be  This test is not yet approved or cleared by the [place] FDA and has been authorized for detection and/or diagnosis of SARS-CoV-2 by FDA under an Emergency Use Authorization (EUA). This EUA will remain in effect (meaning this test can be used) for the duration of the COVID-19 declaration under Section 564(b)(1) of the Act, 21 U.S.C. section 360bbb-3(b)(1), unless the authorization is terminated or revoked.  Performed at [facility], [place] [address] [place], [place] [address]   Culture, blood (routine x 2)     Status: Abnormal   Collection Time: 10/27/20 11:26 AM   Specimen: BLOOD  Result Value Ref Range Status   Specimen Description   Final    BLOOD BLOOD LEFT HAND Performed at  [place] [address], [place], [place] [address]    Special Requests   Final    BOTTLES DRAWN AEROBIC AND ANAEROBIC Blood Culture adequate volume Performed at [place] [address] [place], [place] [address]    Culture  Setup Time   Final    GRAM NEGATIVE RODS ANAEROBIC BOTTLE ONLY CRITICAL RESULT CALLED TO, READ BACK BY AND VERIFIED WITH: L. Toni Amend, AT 3568 10/30/20 [place] Performed at [facility], [place] [address] [place], [place] [address]    Culture KLEBSIELLA PNEUMONIAE (A)  Final   Report Status 11/01/2020 FINAL  Final   Organism ID, Bacteria KLEBSIELLA PNEUMONIAE  Final  Susceptibility   Klebsiella pneumoniae - MIC*    AMPICILLIN >=32 RESISTANT Resistant     CEFAZOLIN <=4 SENSITIVE Sensitive     CEFEPIME <=0.12 SENSITIVE Sensitive     CEFTAZIDIME <=1 SENSITIVE Sensitive     CEFTRIAXONE <=0.25 SENSITIVE Sensitive     CIPROFLOXACIN <=0.25 SENSITIVE Sensitive     GENTAMICIN <=1 SENSITIVE Sensitive     IMIPENEM <=0.25 SENSITIVE Sensitive     TRIMETH/SULFA <=20 SENSITIVE Sensitive     AMPICILLIN/SULBACTAM 4 SENSITIVE Sensitive     PIP/TAZO <=4 SENSITIVE Sensitive     * KLEBSIELLA PNEUMONIAE  Blood Culture ID Panel (Reflexed)     Status: Abnormal   Collection Time: 10/27/20 11:26 AM  Result Value Ref Range Status   Enterococcus faecalis NOT DETECTED NOT DETECTED Final   Enterococcus Faecium NOT DETECTED NOT DETECTED Final   Listeria monocytogenes NOT DETECTED NOT DETECTED Final   Staphylococcus species NOT DETECTED NOT DETECTED Final   Staphylococcus aureus (BCID) NOT DETECTED NOT DETECTED Final   Staphylococcus epidermidis NOT DETECTED NOT DETECTED Final   Staphylococcus lugdunensis NOT DETECTED NOT DETECTED Final   Streptococcus species NOT DETECTED NOT DETECTED Final   Streptococcus agalactiae NOT DETECTED NOT DETECTED Final   Streptococcus pneumoniae NOT DETECTED NOT DETECTED Final    Streptococcus pyogenes NOT DETECTED NOT DETECTED Final   A.calcoaceticus-baumannii NOT DETECTED NOT DETECTED Final   Bacteroides fragilis NOT DETECTED NOT DETECTED Final   Enterobacterales DETECTED (A) NOT DETECTED Final    Comment: Enterobacterales represent a large order of gram negative bacteria, not a single organism. CRITICAL RESULT CALLED TO, READ BACK BY AND VERIFIED WITH: L. POINDEXTER PHARMD, AT 5361 10/30/20 D. VANHOOK    Enterobacter cloacae complex NOT DETECTED NOT DETECTED Final   Escherichia coli NOT DETECTED NOT DETECTED Final   Klebsiella aerogenes NOT DETECTED NOT DETECTED Final   Klebsiella oxytoca NOT DETECTED NOT DETECTED Final   Klebsiella pneumoniae DETECTED (A) NOT DETECTED Final    Comment: CRITICAL RESULT CALLED TO, READ BACK BY AND VERIFIED WITH: L. POINDEXTER PHARMD, AT 4431 10/30/20 D. VANHOOK    Proteus species NOT DETECTED NOT DETECTED Final   Salmonella species NOT DETECTED NOT DETECTED Final   Serratia marcescens NOT DETECTED NOT DETECTED Final   Haemophilus influenzae NOT DETECTED NOT DETECTED Final   Neisseria meningitidis NOT DETECTED NOT DETECTED Final   Pseudomonas aeruginosa NOT DETECTED NOT DETECTED Final   Stenotrophomonas maltophilia NOT DETECTED NOT DETECTED Final   Candida albicans NOT DETECTED NOT DETECTED Final   Candida auris NOT DETECTED NOT DETECTED Final   Candida glabrata NOT DETECTED NOT DETECTED Final   Candida krusei NOT DETECTED NOT DETECTED Final   Candida parapsilosis NOT DETECTED NOT DETECTED Final   Candida tropicalis NOT DETECTED NOT DETECTED Final   Cryptococcus neoformans/gattii NOT DETECTED NOT DETECTED Final   CTX-M ESBL NOT DETECTED NOT DETECTED Final   Carbapenem resistance IMP NOT DETECTED NOT DETECTED Final   Carbapenem resistance KPC NOT DETECTED NOT DETECTED Final   Carbapenem resistance NDM NOT DETECTED NOT DETECTED Final   Carbapenem resist OXA 48 LIKE NOT DETECTED NOT DETECTED Final   Carbapenem resistance VIM  NOT DETECTED NOT DETECTED Final    Comment: Performed at [facility], [address] [place], [place] [address]  Cath Tip Culture     Status: Abnormal   Collection Time: 10/27/20  3:44 PM   Specimen: Catheter Tip; Other  Result Value Ref Range Status   Specimen Description   Final  CATH TIP Performed at [facility], [place] [address] [place], [place] [address]    Special Requests   Final    NONE Performed at [facility], [place] [address], [place], [place] [address]    Culture (A)  Final    5,000 COLONIES/mL KLEBSIELLA PNEUMONIAE 50,000 COLONIES/mL STAPHYLOCOCCUS EPIDERMIDIS    Report Status 10/31/2020 FINAL  Final  Culture, blood (routine x 2)     Status: None (Preliminary result)   Collection Time: 10/30/20  7:41 AM   Specimen: BLOOD  Result Value Ref Range Status   Specimen Description   Final    BLOOD RIGHT ANTECUBITAL Performed at [place] [address] [place], [place] [address]    Special Requests   Final    BOTTLES DRAWN AEROBIC AND ANAEROBIC Blood Culture adequate volume Performed at [place] [address] [place], [place] [address]    Culture   Final    NO GROWTH 2 DAYS Performed at [place] [address] [place], [place] [address]    Report Status PENDING  Incomplete  Culture, blood (routine x 2)     Status: None (Preliminary result)   Collection Time: 10/30/20  7:47 AM   Specimen: BLOOD  Result Value Ref Range Status   Specimen Description   Final    BLOOD LEFT ANTECUBITAL Performed at [place] [address] [place], [place] [address]    Special Requests   Final    BOTTLES DRAWN AEROBIC AND ANAEROBIC Blood Culture adequate volume Performed at [place] [address] [place],  [address]    Culture   Final    NO GROWTH 2 DAYS Performed at [place] [address], [place],   [address]    Report Status PENDING  Incomplete    Procedures/Studies: US Abdomen Complete  Result Date: 10/03/2020 CLINICAL DATA:  Non alcoholic fatty liver disease and splenomegaly. EXAM: ABDOMEN ULTRASOUND COMPLETE COMPARISON:  Selisa 25, 2020 FINDINGS: Gallbladder: Surgically absent Common bile duct: Diameter: 3 mm Liver: No focal lesion identified. Diffusely increased parenchymal echogenicity with decreased acoustic through transmission. There is no overt contour nodularity. Portal vein is patent on color Doppler imaging with normal direction of blood flow towards the liver. IVC: No abnormality visualized. Pancreas: Visualized portion unremarkable. Spleen: Splenomegaly similar prior measuring 10.5 cm in maximum dimension with a volume of 369 cc. Right Kidney: Length: 10.9 cm. Echogenicity within normal limits. No mass or hydronephrosis visualized. Left Kidney: Length: 10.3 cm. Echogenicity within normal limits. No mass or hydronephrosis visualized. Abdominal aorta: No aneurysm visualized. Other findings: None. IMPRESSION: 1. The echogenicity of the liver is increased. This is a nonspecific finding but is most commonly seen with fatty infiltration of the liver. There are no obvious focal liver lesions. 2. Stable splenomegaly. Electronically Signed   By: Dahlia Bailiff MD   On: 10/03/2020 16:59     Time coordinating discharge: Over 30 minutes    Dwyane Dee, MD  [facility] 11/01/2020, 4:35 PM

## 2020-11-01 NOTE — Progress Notes (Signed)
Nutrition Follow-up  DOCUMENTATION CODES:   Not applicable  INTERVENTION:  - continue 30 ml Prosource Plus TID.   NUTRITION DIAGNOSIS:   Increased nutrient needs related to acute illness as evidenced by estimated needs. 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 She was referred to the ED by her Gastroenterologist on 6/18 d/t chills, sweats, and subjective fevers; concern for bacteremia. Tunneled L IJ CVC was removed by IR on 10/27/20.  Patient is out of the room to IR at this time.   Most recently documented meal intakes: 6/19- 75% of breakfast (412 kcal and 15 grams protein) 6/20- 100% of lunch (320 kcal and 24 grams protein) 6/22- 100% of breakfast (468 kcal and 6 grams protein)  She has been accepting Prosource ~50% of the time offered.   She has not been weighed since admission on 6/18. Non-pitting edema to RLE documented in the edema section of flow sheet.   Per Pharmacist's note this AM, plan is for PICC replacement today and then patient to d/c home. No plan for TPN to be resumed inpatient.    Labs reviewed; K: 3.1 mmol/l, Cl: 119 mmol/l, BUN: 7 mg/dl, creatinine: 1.23 mg/dl, Ca: 8 mg/dl Medications reviewed; 1 tablet oscal-D/day, 2 g IV Mg sulfate x1 run 6/23, 1 tablet multivitamin with minerals/day, 40 mg oral protonix/day, 40 mEq Klor-Con x1 dose 6/23, 25 mg aldactone/day.    NUTRITION - FOCUSED PHYSICAL EXAM:  Unable to complete at this  time.  Diet Order:   Diet Order             Diet regular Room service appropriate? Yes; Fluid consistency: Thin  Diet effective now                   EDUCATION NEEDS:   No education needs have been identified at this time  Skin:  Skin Assessment: Reviewed RN Assessment  Last BM:  6/22  Height:   Ht Readings from Last 1 Encounters:  10/27/20 5\' 8"  (1.727 m)    Weight:   Wt Readings from Last 1 Encounters:  10/27/20 64.5 kg      Estimated Nutritional Needs:  Kcal:  1900-2100 kcal Protein:  95-110 grams Fluid:  >/= 2.2 L/day       Jarome Matin, MS, RD, LDN, CNSC Inpatient Clinical Dietitian RD pager # available in [facility]  After hours/weekend pager # available in [facility]

## 2020-11-01 NOTE — Plan of Care (Signed)
  Problem: Clinical Measurements: Goal: Will remain free from infection Outcome: Progressing   Problem: Safety: Goal: Ability to remain free from injury will improve Outcome: Progressing   

## 2020-11-01 NOTE — Procedures (Signed)
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    Successful placement of right common femoral vein approach single lumen tunneled CVC with tip within the inferior caval-atrial junction.    The CVC is ready for immediate use.  EBL: None No immediate post procedural complication.  The PICC line is ready for immediate use.  Ronny Bacon, MD Pager #: [phone]

## 2020-11-01 NOTE — Progress Notes (Signed)
ID Brief Note   Susceptibility   Klebsiella pneumoniae    MIC    AMPICILLIN >=32 RESIST... Resistant    AMPICILLIN/SULBACTAM 4 SENSITIVE  Sensitive    CEFAZOLIN <=4 SENSITIVE  Sensitive    CEFEPIME <=0.12 SENS... Sensitive    CEFTAZIDIME <=1 SENSITIVE  Sensitive    CEFTRIAXONE <=0.25 SENS... Sensitive    CIPROFLOXACIN <=0.25 SENS... Sensitive    GENTAMICIN <=1 SENSITIVE  Sensitive    IMIPENEM <=0.25 SENS... Sensitive    PIP/TAZO <=4 SENSITIVE  Sensitive    TRIMETH/SULFA <=20 SENSIT... Sensitive     Patient is afebrile, no leukocytosis, hemodynamically stable Repeat blood cultures no growth in 2 days   Recommendations Can change ceftriaxone to cephalexin for 3 more days. This will complete 7 days of treatment from date of central line removal on 6/8 Okay to place a PICC line if needed Will sign off for now   Rosiland Oz, MD Infectious Disease Physician [facility] [address] [place], [place] [address] Phone: [phone]  Fax: [phone]'

## 2020-11-02 ENCOUNTER — Other Ambulatory Visit (HOSPITAL_COMMUNITY): Payer: Self-pay | Admitting: Internal Medicine

## 2020-11-02 ENCOUNTER — Other Ambulatory Visit: Payer: Self-pay | Admitting: Internal Medicine

## 2020-11-02 DIAGNOSIS — D638 Anemia in other chronic diseases classified elsewhere: Secondary | ICD-10-CM

## 2020-11-02 DIAGNOSIS — I871 Compression of vein: Secondary | ICD-10-CM

## 2020-11-04 LAB — CULTURE, BLOOD (ROUTINE X 2)
Culture: NO GROWTH
Culture: NO GROWTH
Special Requests: ADEQUATE
Special Requests: ADEQUATE

## 2020-11-08 ENCOUNTER — Ambulatory Visit
Admission: RE | Admit: 2020-11-08 | Discharge: 2020-11-08 | Disposition: A | Discharge status: Home / Self Care | Payer: Medicare Other | Source: Ambulatory Visit | Attending: Internal Medicine | Admitting: Internal Medicine

## 2020-11-08 DIAGNOSIS — Z452 Encounter for adjustment and management of vascular access device: Secondary | ICD-10-CM | POA: Diagnosis not present

## 2020-11-08 DIAGNOSIS — R7881 Bacteremia: Secondary | ICD-10-CM | POA: Diagnosis not present

## 2020-11-08 DIAGNOSIS — I878 Other specified disorders of veins: Secondary | ICD-10-CM | POA: Diagnosis not present

## 2020-11-08 DIAGNOSIS — I82211 Chronic embolism and thrombosis of superior vena cava: Secondary | ICD-10-CM | POA: Diagnosis not present

## 2020-11-08 DIAGNOSIS — I871 Compression of vein: Secondary | ICD-10-CM

## 2020-11-08 MED ORDER — IOPAMIDOL (ISOVUE-300) INJECTION 61%
125.0000 mL | Freq: Once | INTRAVENOUS | Status: AC | PRN
  Administered 2020-11-08: 125 mL via INTRAVENOUS

## 2020-11-13 ENCOUNTER — Other Ambulatory Visit: Payer: Self-pay | Admitting: Family Medicine

## 2020-11-14 ENCOUNTER — Other Ambulatory Visit: Payer: Self-pay

## 2020-11-14 ENCOUNTER — Ambulatory Visit
Admission: RE | Admit: 2020-11-14 | Discharge: 2020-11-14 | Disposition: A | Discharge status: Home / Self Care | Payer: Medicare Other | Source: Ambulatory Visit | Attending: Internal Medicine | Admitting: Internal Medicine

## 2020-11-14 ENCOUNTER — Telehealth: Payer: Self-pay | Admitting: Hematology and Oncology

## 2020-11-14 DIAGNOSIS — I82211 Chronic embolism and thrombosis of superior vena cava: Secondary | ICD-10-CM | POA: Diagnosis not present

## 2020-11-14 DIAGNOSIS — I871 Compression of vein: Secondary | ICD-10-CM

## 2020-11-14 DIAGNOSIS — I878 Other specified disorders of veins: Secondary | ICD-10-CM | POA: Diagnosis not present

## 2020-11-14 HISTORY — PX: IR RADIOLOGIST EVAL & MGMT: IMG5224

## 2020-11-14 NOTE — Telephone Encounter (Signed)
Pt tested positive for covid. Per RN Kelsey's directions I made pt's MD visit on Friday a virtual visit and cancelled her injection. Per Merleen Nicely we will r/s the injection after pt speaks with MD. Pt aware of updates.

## 2020-11-14 NOTE — Consult Note (Signed)
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  Patient was admitted to the hospital with symptoms of bacteremia, ultimately requiring a catheter holiday and removal of chronic left jugular approach central venous catheter.  Unfortunately, attempt at replacement of the left internal jugular procedure venous catheter confirmed left-sided central venous obstruction and as such patient underwent placement of a right common femoral approach tunneled central venous catheter for durable intravenous access needs.  Patient has subsequently been evaluated with SVC protocol chest CT performed 11/08/2020 and is seen today in telemedicine consultation for evaluation for potential central venous recanalization for the purposes of reestablishing a neck/chest wall central venous catheter.  The patient states that she has tolerating the right thigh catheter fairly well though does admit it is more of a nuisance than her chest wall catheters.  Patient did have an allergic reaction to the pre-existing dressing though this has nearly completely resolved since converting to paper tape.  Unfortunately, the patient has contracted COVID from her family members though is relatively asymptomatic.     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   Fracture of left clavicle    History of blood transfusion 2013   anemia   Hx of cardiovascular stress test    Myoview (9/15):  inf-apical scar; no ischemia; EF 47% - low risk    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 12/12/2011   Osteoporosis    Pancytopenia 10/07/2011   Pathologic fracture of neck of femur (HCC)    Personal history of colonic polyps    Renal insufficiency    hx of yrs ago   Serratia marcescens infection - bactermia assoc w/ PICC 01/18/2015   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HCC)    Splenomegaly    By ultrasound   Stroke (HCC) 07/2020   Thrombophilia (HCC)    Vitamin D deficiency     Past Surgical History:  Procedure Laterality Date   APPENDECTOMY  yrs ago   CHOLECYSTECTOMY  yrs ago   COLONOSCOPY  12/05/2005   internal hemorrhoids (for polyp surveillance)   COLONOSCOPY  04/26/2012   Procedure: COLONOSCOPY;  Surgeon: Iva Boop, MD;  Location: [facility];  Service: Endoscopy;  Laterality: N/A;   COLONOSCOPY WITH PROPOFOL N/A 03/18/2016   Procedure: COLONOSCOPY WITH PROPOFOL;  Surgeon: Iva Boop, MD;  Location: [facility];  Service: Endoscopy;  Laterality: N/A;   ESOPHAGOGASTRODUODENOSCOPY  01/22/2009   erosive esophagitis   HARDWARE  REMOVAL Left 12/31/2017   Procedure: HARDWARE REMOVAL;  Surgeon: Rod Can, MD;  Location: [facility];  Service: Orthopedics;  Laterality: Left;   INTRAMEDULLARY (IM) NAIL INTERTROCHANTERIC Left 12/31/2017   Procedure: INTRAMEDULLARY (IM) NAIL SUBTROCHANTRIC;  Surgeon: Rod Can, MD;  Location: [facility];  Service: Orthopedics;  Laterality: Left;   IR CV LINE  INJECTION  03/23/2018   IR FLUORO GUIDE CV LINE LEFT  01/01/2018   IR FLUORO GUIDE CV LINE LEFT  03/24/2018   IR FLUORO GUIDE CV LINE LEFT  05/21/2018   IR FLUORO GUIDE CV LINE LEFT  07/09/2018   IR FLUORO GUIDE CV LINE RIGHT  11/01/2020   IR GENERIC HISTORICAL  07/04/2016   IR REMOVAL TUN CV CATH W/O FL 07/04/2016 Ascencion Dike, PA-C [facility]   IR GENERIC HISTORICAL  07/10/2016   IR US GUIDE VASC ACCESS LEFT 07/10/2016 Arne Cleveland, MD [facility]   IR GENERIC HISTORICAL  07/10/2016   IR FLUORO GUIDE CV LINE LEFT 07/10/2016 Arne Cleveland, MD [facility]   IR PATIENT EVAL TECH 0-60 MINS  10/07/2017   IR PATIENT EVAL TECH 0-60 MINS  03/09/2018   IR RADIOLOGIST EVAL & MGMT  07/21/2017   IR US GUIDE VASC ACCESS LEFT  11/01/2020   IR US GUIDE VASC ACCESS RIGHT  11/01/2020   IR VENO/EXT/UNI LEFT  11/01/2020   LEFT HEART CATH AND CORONARY ANGIOGRAPHY N/A 06/26/2020   Procedure: LEFT HEART CATH AND CORONARY ANGIOGRAPHY;  Surgeon: Jolaine Artist, MD;  Location: [facility];  Service: Cardiovascular;  Laterality: N/A;   ORIF PROXIMAL FEMORAL FRACTURE W/ ITST NAIL SYSTEM  03/2007   left, Dr. Shellia Carwin   SMALL INTESTINE SURGERY  2005   multiple with right colon resection for ischemia/infarct    Allergies: Penicillins  Medications: Prior to Admission medications   Medication Sig Start Date End Date Taking? Authorizing Provider  acetaminophen (TYLENOL) 500 MG tablet Take 1,000 mg by mouth daily as needed for mild pain.    [provider]  ADULT TPN Inject 1,800 mLs into the vein 4 (four) times a week. Pt receives home TPN from [facility]:  1800 mL bag, four nights weekly (Monday, Tuesday, Wednesday, Thursday for 8 hours (includes 1 hour taper up and down).    [provider]  baclofen (LIORESAL) 10 MG tablet Take 1 tablet (10 mg total) by mouth 2 (two) times daily as needed for muscle spasms. 08/29/20   Frann Rider, NP  Calcium Carb-Cholecalciferol (CALCIUM + D3 PO) Take  2 tablets by mouth daily.    [provider]  cetirizine (ZYRTEC) 10 MG tablet Take 10 mg by mouth at bedtime as needed for allergies.    [provider]  diphenhydrAMINE (BENADRYL) 25 mg capsule Take 25-50 mg by mouth every 6 (six) hours as needed for itching or sleep.     [provider]  fluticasone (FLONASE) 50 MCG/ACT nasal spray Place 2 sprays into both nostrils daily as needed for allergies or rhinitis.    [provider]  furosemide (LASIX) 20 MG tablet TAKE ONE TABLET BY MOUTH AS NEEDED FOR SWELLING 08/01/20   Bensimhon, Shaune Pascal, MD  Heparin Sodium, Porcine, (HEPARIN LOCK FLUSH IJ) Inject 5 mLs as directed 4 (four) times a week. Monday, Tuesday, Wednesday, Thursday in the morning with TPN    [provider]  Multiple Vitamins-Minerals (ADULT GUMMY PO) Take 2 tablets by mouth daily.    [provider]  omeprazole (PRILOSEC) 40 MG capsule  Take 40 mg by mouth daily.    [provider]  ondansetron (ZOFRAN-ODT) 8 MG disintegrating tablet TAKE 1 TABLET (8 MG TOTAL) BY MOUTH EVERY 8 (EIGHT) HOURS AS NEEDED FOR NAUSEA OR VOMITING. 12/05/19   Gatha Mayer, MD  sacubitril-valsartan (ENTRESTO) 24-26 MG Take 1 tablet by mouth 2 (two) times daily. 05/09/20   Josue Hector, MD  sodium chloride 0.9 % infusion Inject 900 mLs into the vein every 14 (fourteen) days. Uses for TPN 10/27/17   [provider]  spironolactone (ALDACTONE) 25 MG tablet Take 1 tablet (25 mg total) by mouth daily. 07/04/20   Bensimhon, Shaune Pascal, MD  Teduglutide, rDNA, 5 MG KIT Inject 3.1 application into the skin every morning.    [provider]  triamcinolone ointment (KENALOG) 0.1 % Apply 1 application topically 2 (two) times daily. 10/15/20 10/15/21  [name], MD  vitamin E 1000 UNIT capsule Take 1,000 Units by mouth daily.    [provider]  XARELTO 20 MG TABS tablet TAKE 1 TABLET (20 MG TOTAL) BY MOUTH DAILY WITH SUPPER. 11/13/20    [name], MD     Family History  Problem Relation Age of Onset   Diabetes Mother    Hypertension Mother    AAA (abdominal aortic aneurysm) Mother    Colon cancer Neg Hx    Stomach cancer Neg Hx     Social History   Socioeconomic History   Marital status: Married    Spouse name: Not on file   Number of children: Not on file   Years of education: Not on file   Highest education level: Not on file  Occupational History   Not on file  Tobacco Use   Smoking status: Former    Pack years: 0.00    Types: Cigarettes    Quit date: 05/12/2004    Years since quitting: 16.5   Smokeless tobacco: Never  Vaping Use   Vaping Use: Never used  Substance and Sexual Activity   Alcohol use: No   Drug use: No   Sexual activity: Yes    Comment: 1st intercourse 43 yo-5 partners  Other Topics Concern   Not on file  Social History Narrative   Married to [place], has 1 daughter and a granddaughter the patient's son died in childhood due to a motor vehicle wreck   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   No alcohol tobacco or drug use   04/07/2017   Social Determinants of Health   Financial Resource Strain: Not on file  Food Insecurity: Not on file  Transportation Needs: No Transportation Needs   Lack of Transportation (Medical): No   Lack of Transportation (Non-Medical): No  Physical Activity: Not on file  Stress: Not on file  Social Connections: Not on file    ECOG Status: 0 - Asymptomatic  Review of Systems  Review of Systems: A 12 point ROS discussed and pertinent positives are indicated in the HPI above.  All other systems are negative.  Physical Exam No direct physical exam was performed (except for noted visual exam findings with Video Visits).   Vital Signs: LMP 02/26/2014   Imaging: CT CHEST W CONTRAST  Result Date: 11/08/2020 CLINICAL DATA:  History of short gut syndrome with chronic central venous catheters for TPN  administration. Patient was recently admitted to the hospital with bacteremia requiring a catheter holiday however a jugular approach intravenous catheter could not be placed secondary to  apparent SVC occlusion. Please perform SVC protocol chest CT 4 procedural planning purposes prior to consideration of attempted central venous recanalization. EXAM: CT CHEST WITH CONTRAST TECHNIQUE: Multidetector CT imaging of the chest was performed during intravenous contrast administration. CONTRAST:  133mL ISOVUE-300 IOPAMIDOL (ISOVUE-300) INJECTION 61% COMPARISON:  Chest CT-10/24/2011 FINDINGS: Cardiovascular: There is chronic occlusion of the SVC, bilateral innominate veins and central aspect of the right subclavian vein. The length of the occluded segment of the SVC through the central aspect the right subclavian vein measures approximately 5 cm (coronal images 72 through 80, series 4; sagittal image 98, series 6). This finding is associated with hypertrophy of the azygos system as well as development of multiple paraspinal and intercostal venous collaterals. Additionally, mildly hypertrophied venous collaterals are seen about the left lateral chest wall bilaterally. The bilateral external jugular veins are patent, the right is larger left. 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 The left internal jugular vein remains patent. Scattered atherosclerotic plaque within normal caliber thoracic aorta. Conventional configuration of the aortic arch. The branch vessels of the aortic arch appear widely patent throughout their imaged courses. Borderline cardiomegaly. Coronary artery calcifications. No pericardial effusion. Although this examination was not tailored for the evaluation the pulmonary arteries, there are no discrete filling defects within the central pulmonary arterial tree to suggest central pulmonary embolism. Normal caliber of the main pulmonary artery. Mediastinum/Nodes: Mildly  prominent precarinal lymph node is not enlarged by size criteria measuring 7 mm greatest short axis diameter (image 65, series 2, presumably reactive in etiology. No bulky mediastinal, hilar or axillary lymphadenopathy. Lungs/Pleura: Ill-defined ground-glass nodular opacities are seen within bilateral apices, each measuring 7 mm in diameter (left - image 25, series 8; right - image 51, series 8), neither seen on remote chest CT performed in 2013. Minimal amount geographic pleuroparenchymal thickening about the anterior aspect the right minor fissure (image 86, series 8). No discrete focal airspace opacities. No pleural effusion or pneumothorax. The central pulmonary airways appear widely patent. Upper Abdomen: Diffuse decreased attenuation of the hepatic parenchyma this postcontrast examination suggestive of hepatic steatosis. Post cholecystectomy. Additional surgical clip is noted adjacent to the dome of the right lobe of the liver. The tip of right common femoral vein central venous catheter terminates within the intrahepatic IVC. There is chronic occlusion of the SMA with mixed calcified and noncalcified atherosclerotic plaque involving the origin the celiac artery likely resulting in at least a 50% luminal narrowing. Musculoskeletal: No acute or aggressive osseous abnormalities. Old fracture involving the surgical neck of the right humerus with residual deformity IMPRESSION: 1. Chronic occlusion of the SVC, bilateral innominate veins, the central most aspect of the right subclavian vein and the right internal jugular vein, the sequela of previous chronic central venous catheters. 2. Hypertrophy of the azygos system with development of multiple paraspinal, intercostal and bilateral chest wall venous collaterals. 3. 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 4. Ill-defined ground-glass nodular opacities within the bilateral lung apices measuring  approximately 7 mm in diameter. Comparison to recent prior outside examinations (if available), is advised otherwise, a non-contrast chest CT at 3-6 months is recommended. If nodules persist, subsequent management will be based upon the most suspicious nodule(s). This recommendation follows the consensus statement: Guidelines for Management of Incidental Pulmonary Nodules Detected on CT Images: From the Fleischner Society 2017; Radiology 2017; 284:228-243. Electronically Signed   By: Sandi Mariscal  M.D.   On: 11/08/2020 14:17   IR Veno/Ext/Uni Left  Result Date: 11/02/2020 INDICATION: History of short gut syndrome (secondary to ischemic bowel) with multiple chronic tunneled central venous catheters for long-term TPN administration. Patient was admitted to the hospital with presumed catheter related bacteremia subsequently undergoing catheter removal for the purposes of a catheter holiday. Patient with history of right subclavian vein occlusion demonstrated on left internal jugular approach tunneled central venous catheter placement performed 07/10/2016. EXAM: 1. ULTRASOUND GUIDED ACCESS OF THE LEFT INTERNAL JUGULAR VEIN 2. LEFT INTERNAL JUGULAR VENOGRAM WITH ATTEMPTED CENTRAL VENOUS RECANALIZATION 3. ULTRASOUND AND FLUOROSCOPIC GUIDED PLACEMENT OF RIGHT COMMON FEMORAL VEIN APPROACH TUNNELED CENTRAL VENOUS CATHETER MEDICATIONS: None. The antibiotic was given in an appropriate time interval prior to skin puncture. ANESTHESIA/SEDATION: None COMPARISON:  Multiple previous left jugular approach central venous catheter exchanges, most recently on 07/09/2018. Attempted right internal jugular approach central venous catheter placement-07/11/2026 FLUOROSCOPY TIME:  9 minutes, 36 seconds (55 mGy) COMPLICATIONS: None immediate. PROCEDURE: Informed written consent was obtained from the patient after a discussion of the risks, benefits, and alternatives to treatment. Questions regarding the procedure were encouraged and  answered. 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 The left internal jugular vein was noted to be patent and as such the left neck and chest were prepped with chlorhexidine in a sterile fashion, and a sterile drape was applied covering the operative field. Maximum barrier sterile technique with sterile gowns and gloves were used for the procedure. A timeout was performed prior to the initiation of the procedure. After the overlying soft tissues were anesthetized, a small venotomy incision was created and a micropuncture kit was utilized to access the internal jugular vein. Real-time ultrasound guidance was utilized for vascular access including the acquisition of a permanent ultrasound image documenting patency of the accessed vessel. As there was difficulty advancing the microwire centrally, a internal jugular venogram was performed via the micropuncture sheath, occlusion of the central aspect of the left subclavian vein. Over a guidewire, the micropuncture sheath was exchanged for a Kumpe catheter and efforts were made with both a regular stiff Glidewire to recannulate the occluded left upper extremity central venous system however this ultimately proved unsuccessful Given known history of right-sided central venous occlusion decision was made to proceed with right common femoral vein approach tunneled catheter placement. As such, the right groin was prepped and draped in usual sterile fashion. After the overlying soft tissues were anesthetized with 1% lidocaine, the right common femoral vein was accessed with a micropuncture kit after lying soft tissues were anesthetized with 1% lidocaine. The microwire was utilized to measure appropriate catheter length. The micropuncture sheath was exchanged for a peel-away sheath over a guidewire. A 5 French single lumen tunneled central venous catheter measuring 45 cm was  tunneled in a retrograde fashion from the right lateral thigh to the venotomy incision. The catheter was then placed through the peel-away sheath with tip ultimately positioned at the inferior caval-atrial junction. Final catheter positioning was confirmed and documented with a spot fluoroscopic images. The catheter aspirates and flushes normally. The catheter was flushed with appropriate volume heparin dwells. The catheter exit site was secured with a 0-Prolene retention suture. The venotomy incision was closed with an interrupted 4-0 Vicryl, Dermabond and Steri-strips. Dressings were applied. The patient tolerated the procedure well without immediate post procedural complication. FINDINGS: While the left internal jugular vein remains patent, there is occlusion of the central  aspect of the left subclavian vein which was refractory to attempted percutaneous recanalization. After catheter placement, the right common femoral vein approach tunneled central venous catheter tip lies within the inferior cavoatrial junction. The catheter aspirates and flushes normally and is ready for immediate use. IMPRESSION: 1. Successful placement of right common femoral approach 45 cm single lumen tunneled central venous catheter with tip terminating within the inferior caval atrial junction. The catheter is ready for immediate use. 2. 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 3. 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 PLAN: - Above was discussed with patient's providing gastroenterologist, Dr. Carlean Purl. - The patient is to be discharged from the hospital later today and as such we will coordinate obtain an outpatient contrast-enhanced chest CT to evaluate for feasibility of attempted upper extremity central venous  recanalization. Patient will be seen in follow-up telemedicine consultation following the acquisition of the contrast-enhanced chest CT. Electronically Signed   By: Sandi Mariscal M.D.   On: 11/02/2020 11:57   IR Fluoro Guide CV Line Right  Result Date: 11/02/2020 INDICATION: History of short gut syndrome (secondary to ischemic bowel) with multiple chronic tunneled central venous catheters for long-term TPN administration. Patient was admitted to the hospital with presumed catheter related bacteremia subsequently undergoing catheter removal for the purposes of a catheter holiday. Patient with history of right subclavian vein occlusion demonstrated on left internal jugular approach tunneled central venous catheter placement performed 07/10/2016. EXAM: 1. ULTRASOUND GUIDED ACCESS OF THE LEFT INTERNAL JUGULAR VEIN 2. LEFT INTERNAL JUGULAR VENOGRAM WITH ATTEMPTED CENTRAL VENOUS RECANALIZATION 3. ULTRASOUND AND FLUOROSCOPIC GUIDED PLACEMENT OF RIGHT COMMON FEMORAL VEIN APPROACH TUNNELED CENTRAL VENOUS CATHETER MEDICATIONS: None. The antibiotic was given in an appropriate time interval prior to skin puncture. ANESTHESIA/SEDATION: None COMPARISON:  Multiple previous left jugular approach central venous catheter exchanges, most recently on 07/09/2018. Attempted right internal jugular approach central venous catheter placement-07/11/2026 FLUOROSCOPY TIME:  9 minutes, 36 seconds (55 mGy) COMPLICATIONS: None immediate. PROCEDURE: Informed written consent was obtained from the patient after a discussion of the risks, benefits, and alternatives to treatment. Questions regarding the procedure were encouraged and answered. 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 The left internal jugular vein was noted to be patent and as such the left neck and chest were prepped with chlorhexidine in a sterile fashion, and a sterile drape was  applied covering the operative field. Maximum barrier sterile technique with sterile gowns and gloves were used for the procedure. A timeout was performed prior to the initiation of the procedure. After the overlying soft tissues were anesthetized, a small venotomy incision was created and a micropuncture kit was utilized to access the internal jugular vein. Real-time ultrasound guidance was utilized for vascular access including the acquisition of a permanent ultrasound image documenting patency of the accessed vessel. As there was difficulty advancing the microwire centrally, a internal jugular venogram was performed via the micropuncture sheath, occlusion of the central aspect of the left subclavian vein. Over a guidewire, the micropuncture sheath was exchanged for a Kumpe catheter and efforts were made with both a regular stiff Glidewire to recannulate the occluded left upper extremity central venous system however this ultimately proved unsuccessful Given known history of right-sided central venous occlusion decision was made to proceed with right common femoral  vein approach tunneled catheter placement. As such, the right groin was prepped and draped in usual sterile fashion. After the overlying soft tissues were anesthetized with 1% lidocaine, the right common femoral vein was accessed with a micropuncture kit after lying soft tissues were anesthetized with 1% lidocaine. The microwire was utilized to measure appropriate catheter length. The micropuncture sheath was exchanged for a peel-away sheath over a guidewire. A 5 French single lumen tunneled central venous catheter measuring 45 cm was tunneled in a retrograde fashion from the right lateral thigh to the venotomy incision. The catheter was then placed through the peel-away sheath with tip ultimately positioned at the inferior caval-atrial junction. Final catheter positioning was confirmed and documented with a spot fluoroscopic images. The catheter  aspirates and flushes normally. The catheter was flushed with appropriate volume heparin dwells. The catheter exit site was secured with a 0-Prolene retention suture. The venotomy incision was closed with an interrupted 4-0 Vicryl, Dermabond and Steri-strips. Dressings were applied. The patient tolerated the procedure well without immediate post procedural complication. FINDINGS: While the left internal jugular vein remains patent, there is occlusion of the central aspect of the left subclavian vein which was refractory to attempted percutaneous recanalization. After catheter placement, the right common femoral vein approach tunneled central venous catheter tip lies within the inferior cavoatrial junction. The catheter aspirates and flushes normally and is ready for immediate use. IMPRESSION: 1. Successful placement of right common femoral approach 45 cm single lumen tunneled central venous catheter with tip terminating within the inferior caval atrial junction. The catheter is ready for immediate use. 2. 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 3. 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 PLAN: - Above was discussed with patient's providing gastroenterologist, Dr. Carlean Purl. - The patient is to be discharged from the hospital later today and as such we will coordinate obtain an outpatient contrast-enhanced chest CT to evaluate for feasibility of attempted upper extremity central venous recanalization. Patient will be seen in follow-up telemedicine consultation following the acquisition of the contrast-enhanced chest CT. Electronically Signed   By: Sandi Mariscal M.D.   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 Patient was admitted to the hospital with presumed catheter related bacteremia subsequently undergoing catheter removal for the purposes of a catheter holiday. Patient with history of right subclavian vein occlusion demonstrated on left internal jugular approach tunneled central venous catheter placement performed 07/10/2016. EXAM: 1. ULTRASOUND GUIDED ACCESS OF THE LEFT INTERNAL JUGULAR VEIN 2. LEFT INTERNAL JUGULAR VENOGRAM WITH ATTEMPTED CENTRAL VENOUS RECANALIZATION 3. ULTRASOUND AND FLUOROSCOPIC GUIDED PLACEMENT OF RIGHT COMMON FEMORAL VEIN APPROACH TUNNELED CENTRAL VENOUS CATHETER MEDICATIONS: None. The antibiotic was given in an appropriate time interval prior to skin puncture. ANESTHESIA/SEDATION: None COMPARISON:  Multiple previous left jugular approach central venous catheter exchanges, most recently on 07/09/2018. Attempted right internal jugular approach central venous catheter placement-07/11/2026 FLUOROSCOPY TIME:  9 minutes, 36 seconds (55 mGy) COMPLICATIONS: None immediate. PROCEDURE: Informed written consent was obtained from the patient after a discussion of the risks, benefits, and alternatives to treatment. Questions regarding the procedure were encouraged and answered. 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 The left internal jugular vein was noted to be patent and as such the left neck and chest were prepped with chlorhexidine in a sterile fashion, and a sterile drape was applied covering the operative field. Maximum barrier sterile technique with sterile gowns and gloves were used for the procedure. A timeout was performed prior to the initiation of the procedure. After the overlying soft tissues were anesthetized, a small venotomy incision was created and a micropuncture kit was utilized to  access the internal jugular vein. Real-time ultrasound guidance was utilized for vascular access including the acquisition of a permanent ultrasound image documenting patency of the accessed vessel. As there was difficulty advancing the microwire centrally, a internal jugular venogram was performed via the micropuncture sheath, occlusion of the central aspect of the left subclavian vein. Over a guidewire, the micropuncture sheath was exchanged for a Kumpe catheter and efforts were made with both a regular stiff Glidewire to recannulate the occluded left upper extremity central venous system however this ultimately proved unsuccessful Given known history of right-sided central venous occlusion decision was made to proceed with right common femoral vein approach tunneled catheter placement. As such, the right groin was prepped and draped in usual sterile fashion. After the overlying soft tissues were anesthetized with 1% lidocaine, the right common femoral vein was accessed with a micropuncture kit after lying soft tissues were anesthetized with 1% lidocaine. The microwire was utilized to measure appropriate catheter length. The micropuncture sheath was exchanged for a peel-away sheath over a guidewire. A 5 French single lumen tunneled central venous catheter measuring 45 cm was tunneled in a retrograde fashion from the right lateral thigh to the venotomy incision. The catheter was then placed through the peel-away sheath with tip ultimately positioned at the inferior caval-atrial junction. Final catheter positioning was confirmed and documented with a spot fluoroscopic images. The catheter aspirates and flushes normally. The catheter was flushed with appropriate volume heparin dwells. The catheter exit site was secured with a 0-Prolene retention suture. The venotomy incision was closed with an interrupted 4-0 Vicryl, Dermabond and Steri-strips. Dressings were applied. The patient tolerated the procedure well without  immediate post procedural complication. FINDINGS: While the left internal jugular vein remains patent, there is occlusion of the central aspect of the left subclavian vein which was refractory to attempted percutaneous recanalization. After catheter placement, the right common femoral vein approach tunneled central venous catheter tip lies within the inferior cavoatrial junction. The catheter aspirates and flushes normally and is ready for immediate use. IMPRESSION: 1. Successful placement of right common femoral approach 45 cm single lumen tunneled central venous catheter with tip terminating within the inferior caval atrial junction. The catheter is ready for immediate use. 2. 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 3. 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 PLAN: - Above was discussed with patient's providing gastroenterologist, Dr. Carlean Purl. - The patient is to be discharged from the hospital later today and as such we will coordinate obtain an outpatient contrast-enhanced chest CT to evaluate for feasibility of attempted upper extremity central venous recanalization. Patient will be seen in follow-up telemedicine consultation following the acquisition of the contrast-enhanced chest CT. Electronically Signed   By: Sandi Mariscal M.D.   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 Patient was admitted to the hospital with presumed catheter related bacteremia subsequently undergoing catheter removal for the purposes of a catheter holiday. Patient with history of right subclavian vein occlusion demonstrated on  left internal jugular approach tunneled central venous catheter placement performed 07/10/2016. EXAM: 1. ULTRASOUND GUIDED ACCESS OF THE LEFT INTERNAL JUGULAR VEIN 2. LEFT INTERNAL JUGULAR VENOGRAM WITH ATTEMPTED CENTRAL VENOUS RECANALIZATION 3. ULTRASOUND AND FLUOROSCOPIC GUIDED PLACEMENT OF RIGHT COMMON FEMORAL VEIN APPROACH TUNNELED CENTRAL VENOUS CATHETER MEDICATIONS: None. The antibiotic was given in an appropriate time interval prior to skin puncture. ANESTHESIA/SEDATION: None COMPARISON:  Multiple previous left jugular approach central venous catheter exchanges, most recently on 07/09/2018. Attempted right internal jugular approach central venous catheter placement-07/11/2026 FLUOROSCOPY TIME:  9 minutes, 36 seconds (55 mGy) COMPLICATIONS: None immediate. PROCEDURE: Informed written consent was obtained from the patient after a discussion of the risks, benefits, and alternatives to treatment. Questions regarding the procedure were encouraged and answered. 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 The left internal jugular vein was noted to be patent and as such the left neck and chest were prepped with chlorhexidine in a sterile fashion, and a sterile drape was applied covering the operative field. Maximum barrier sterile technique with sterile gowns and gloves were used for the procedure. A timeout was performed prior to the initiation of the procedure. After the overlying soft tissues were anesthetized, a small venotomy incision was created and a micropuncture kit was utilized to access the internal jugular vein. Real-time ultrasound guidance was utilized for vascular access including the acquisition of a permanent ultrasound image documenting patency of the accessed vessel. As there was difficulty advancing the microwire centrally, a internal jugular venogram was performed via the micropuncture sheath,  occlusion of the central aspect of the left subclavian vein. Over a guidewire, the micropuncture sheath was exchanged for a Kumpe catheter and efforts were made with both a regular stiff Glidewire to recannulate the occluded left upper extremity central venous system however this ultimately proved unsuccessful Given known history of right-sided central venous occlusion decision was made to proceed with right common femoral vein approach tunneled catheter placement. As such, the right groin was prepped and draped in usual sterile fashion. After the overlying soft tissues were anesthetized with 1% lidocaine, the right common femoral vein was accessed with a micropuncture kit after lying soft tissues were anesthetized with 1% lidocaine. The microwire was utilized to measure appropriate catheter length. The micropuncture sheath was exchanged for a peel-away sheath over a guidewire. A 5 French single lumen tunneled central venous catheter measuring 45 cm was tunneled in a retrograde fashion from the right lateral thigh to the venotomy incision. The catheter was then placed through the peel-away sheath with tip ultimately positioned at the inferior caval-atrial junction. Final catheter positioning was confirmed and documented with a spot fluoroscopic images. The catheter aspirates and flushes normally. The catheter was flushed with appropriate volume heparin dwells. The catheter exit site was secured with a 0-Prolene retention suture. The venotomy incision was closed with an interrupted 4-0 Vicryl, Dermabond and Steri-strips. Dressings were applied. The patient tolerated the procedure well without immediate post procedural complication. FINDINGS: While the left internal jugular vein remains patent, there is occlusion of the central aspect of the left subclavian vein which was refractory to attempted percutaneous recanalization. After catheter placement,  the right common femoral vein approach tunneled central venous  catheter tip lies within the inferior cavoatrial junction. The catheter aspirates and flushes normally and is ready for immediate use. IMPRESSION: 1. Successful placement of right common femoral approach 45 cm single lumen tunneled central venous catheter with tip terminating within the inferior caval atrial junction. The catheter is ready for immediate use. 2. 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 3. 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 PLAN: - Above was discussed with patient's providing gastroenterologist, Dr. Carlean Purl. - The patient is to be discharged from the hospital later today and as such we will coordinate obtain an outpatient contrast-enhanced chest CT to evaluate for feasibility of attempted upper extremity central venous recanalization. Patient will be seen in follow-up telemedicine consultation following the acquisition of the contrast-enhanced chest CT. Electronically Signed   By: Sandi Mariscal M.D.   On: 11/02/2020 11:57    Labs:  CBC: Recent Labs    10/28/20 0447 10/29/20 0447 10/30/20 0429 11/01/20 0409  WBC 4.6 4.4 5.3 6.7  HGB 8.0* 8.2* 8.7* 9.8*  HCT 26.2* 27.3* 30.7* 32.7*  PLT 62* 76* 84* 104*    COAGS: Recent Labs    07/23/20 1920  INR 1.8*  APTT 35    BMP: Recent Labs    10/27/20 0947 10/28/20 0447 10/29/20 0447 11/01/20 0409  NA 136 137 138 139  K 3.5 3.4* 3.8 3.1*  CL 108 113* 117* 119*  CO2 22 18* 17* 17*  GLUCOSE 91 74 78 75  BUN 31* 23 13 7*  CALCIUM 8.1* 7.8* 8.1* 8.0*  CREATININE 1.20* 1.31* 1.12* 1.23*  GFRNONAA 49* 44* 53* 47*    LIVER FUNCTION TESTS: Recent Labs    10/10/20 1051 10/27/20 0947 10/28/20 0447 10/29/20 0447  BILITOT 0.5 0.6 0.3 0.5  AST $Re'14 19 18 18  'ifx$ ALT $R'13 16 16 14  'Qj$ ALKPHOS 98 89 77 87  PROT 6.5 6.9 5.9*  6.4*  ALBUMIN 2.8* 2.8* 2.3* 2.5*    TUMOR MARKERS: No results for input(s): AFPTM, CEA, CA199, CHROMGRNA in the last 8760 hours.  Assessment and Plan:  [name] [name] is a 70 y.o. 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    She is post attempted replacement of the left internal jugular procedure venous catheter confirmed left-sided central venous obstruction and as such patient underwent placement of a right common femoral approach tunneled central venous catheter for durable intravenous access needs on 11/01/2020.  She is seen today for evaluation for potential central venous recanalization for the purposes of reestablishing a neck/chest wall central venous catheter.  The patient states that she has tolerating the right thigh catheter fairly well though does admit it is more of a nuisance than her chest wall catheters.  Personal review of SVC protocol chest CTA demonstrates chronic occlusion of the SVC, bilateral innominate veins, the central most aspect of the right subclavian vein as well as the right internal jugular vein.  The patient's right-sided central venous system has been occluded since attempted right and regular approach to the venous catheter placement on 07/10/2016.  Given chronicity of her SVC  occlusion I feel the likelihood of recanalizing central venous system from a right-sided approach is extremely unlikely with marginal chance of success from a left-sided approach, based solely on the fact that the patient has most recently had a left internal jugular central venous catheter.  Potential treatment options were discussed as follows: - Maintaining a femoral approach central venous catheter - I explained that  femoral catheters have higher rates of injection and as she has developed catheter related occlusions, I would be concerned that long-term femoral catheter may result in either right lower extremity DVT or conceivably caval thrombosis, however ultimately this may be her best option for the foreseeable future.  (Note, I would reserve placement of a transhepatic catheter ONLY once pt has experience a complication related to her femoral catheter)   - Attempted left-sided approach central venous recanalization - I explained that if we were successful in reestablishing access from a left-sided approach, the procedure would entail central venous angioplasty and stent placement prior to proceeding with definitive tunneled catheter placement.  I explained the procedure would be performed at [facility] with conscious sedation.  I explained the largest risk associate with the procedure would be central venous bleeding (hence the reason why procedure will be performed at [facility] where the cardiothoracic surgeons practice).  Other risk associated with central venous recanalization includes (but is not limited to) infection and/or early stent rethrombosis.   - Referral to tertiary medical center for evaluation -I explained that ultimately we may wish to refer the patient to [facility] as they have access to an RF ablation wire which may be required for establishing central venous recanalization and I could help to facilitate this consultation if she were to choose to forego an attempt by our service.  Following this prolonged and detailed conversation the patient wishes to discuss the above with her husband and her daughter. The patient will likely want to have a repeat telemedicine consultation following discussion with her family.  Plan: - The patient will discuss attempted recanalization with her family and knows to call the interventional radiology clinic to set up a repeat  telemedicine consultation as indicated.  (Note, as I am not scheduled at the interventional radiology clinic for the foreseeable future, this appointment will be scheduled during the work day when I am at a less busy facility and/or in a IR BU role at [facility] or [facility]).   Thank you for this interesting consult.  I greatly enjoyed meeting Sharline C Jamerson and look forward to participating in their care.  A copy of this report was sent to the requesting provider on this date.  Electronically Signed: Sandi Mariscal 11/14/2020, 1:25 PM   I spent a total of 30 Minutes in remote  clinical consultation, greater than 50% of which was counseling/coordinating care for central venous occlusion.    Visit type: Audio only (telephone). Audio (no video) only due to patient's lack of internet/smartphone capability. Alternative for in-person consultation at [facility], [address] [place], [place], [place]. This visit type was conducted due to national recommendations for restrictions regarding the COVID-19 Pandemic (e.g. social distancing).  This format is felt to be most appropriate for this patient at this time.  All issues noted in this document were discussed and addressed.

## 2020-11-15 ENCOUNTER — Other Ambulatory Visit: Payer: Self-pay | Admitting: Interventional Radiology

## 2020-11-15 ENCOUNTER — Encounter: Payer: Self-pay | Admitting: *Deleted

## 2020-11-15 DIAGNOSIS — I871 Compression of vein: Secondary | ICD-10-CM

## 2020-11-15 NOTE — Progress Notes (Signed)
  HEMATOLOGY-ONCOLOGY TELEPHONE VISIT PROGRESS NOTE  I connected with [name] [name] on 11/16/2020 at  9:00 AM EDT by telephone and verified that I am speaking with the correct person using two identifiers.  I discussed the limitations, risks, security and privacy concerns of performing an evaluation and management service by telephone and the availability of in person appointments.  I also discussed with the patient that there may be a patient responsible charge related to this service. The patient expressed understanding and agreed to proceed.   History of Present Illness: [name] [name] is a 70 y.o. female with above-mentioned history of thrombocytopenia and anemia. Labs on 11/01/20 RBC 3.14, Hg 9.8, HCT 32.7, platelets 104. She is currently on Xarelto for secondary stroke prevention. She reports via telephone for follow-up.  She was in the hospital recently with Klebsiella sepsis and has finally improved and is feeling better.  Her IJ central line was removed and a femoral line was placed.  Unfortunately her entire family came down with COVID and therefore she could not come here today for her injection appointment for the Aranesp.  Observations/Objective:     Assessment Plan:  Thrombocytopenia (HCC) Normochromic normocytic anemia due 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 She received last Aranesp injection February 2016. She had required a total of 3 Aranesp injections in 2015, while she received at least 10 injections in 2014. So we discontinued therapy.   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 This is the reason for her chronic disease.   Thrombocytopenia ([place]) Patient's baseline platelet count is around 100 09/17/2020: Hemoglobin 9.7, platelets 92 10/10/2020:  Hemoglobin 8.5, platelets 52 10/22/2020: Hemoglobin 8.5, platelets 92 11/01/2020: Hemoglobin 9.8, platelets 104  Hospitalization 10/27/2020-11/01/2020: Sepsis with Klebsiella (left IJ removed 10/27/2020  Due to recent COVID infection, we will have to wait 3 weeks for her to receive a Aranesp injection. Patient will come every 3 weeks for Aranesp injection.     I discussed the assessment and treatment plan with the patient. The patient was provided an opportunity to ask questions and all were answered. The patient agreed with the plan and demonstrated an understanding of the instructions. The patient was advised to call back or seek an in-person evaluation if the symptoms worsen or if the condition fails to improve as anticipated.   Total time spent: 11 mins including non-face to face time and time spent for planning, charting and coordination of care  Rulon Eisenmenger, MD 11/16/2020    I, Thana Ates, am acting as scribe for Nicholas Lose, MD.  I have reviewed the above documentation for accuracy and completeness, and I agree with the above.

## 2020-11-16 ENCOUNTER — Other Ambulatory Visit: Payer: Self-pay

## 2020-11-16 ENCOUNTER — Inpatient Hospital Stay: Payer: Medicare Other | Attending: Hematology and Oncology | Admitting: Hematology and Oncology

## 2020-11-16 ENCOUNTER — Ambulatory Visit: Payer: Medicare Other

## 2020-11-16 DIAGNOSIS — D696 Thrombocytopenia, unspecified: Secondary | ICD-10-CM

## 2020-11-16 DIAGNOSIS — D638 Anemia in other chronic diseases classified elsewhere: Secondary | ICD-10-CM | POA: Insufficient documentation

## 2020-11-16 DIAGNOSIS — K912 Postsurgical malabsorption, not elsewhere classified: Secondary | ICD-10-CM | POA: Insufficient documentation

## 2020-11-16 NOTE — Assessment & Plan Note (Signed)
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 She received last Aranesp injection February 2016. She had required a total of 3 Aranesp injections in 2015, while she received at least 10 injections in 2014. So we discontinued therapy.  Current treatment:  Reinitiated Aranesp 10/26/2020  Short bowel syndromeon Gattex, Teduglutide she is currently on TPN 3 nights a week She had surgery on her small intestine for blood clots in 2006 and since then she has short gut syndrome. This is the reason for her chronic disease.  Thrombocytopenia ([place]) Patient's baseline platelet count is around 100 09/17/2020: Hemoglobin 9.7, platelets 92 10/10/2020: Hemoglobin 8.5, platelets 52 10/22/2020: Hemoglobin 8.5, platelets 92 11/01/2020: Hemoglobin 9.8, platelets 104  Hospitalization 10/27/2020-11/01/2020: Sepsis with Klebsiella (left IJ removed 10/27/2020  Continue with Aranesp injections and monitoring of platelets. Patient will come every 3 weeks for Aranesp injection.  I will see her back in 9 weeks

## 2020-11-19 ENCOUNTER — Telehealth: Payer: Self-pay | Admitting: Hematology and Oncology

## 2020-11-19 NOTE — Telephone Encounter (Signed)
Scheduled per 7/8 los. Called pt and left a msg

## 2020-11-21 ENCOUNTER — Other Ambulatory Visit: Payer: Self-pay | Admitting: Internal Medicine

## 2020-11-23 ENCOUNTER — Ambulatory Visit
Admission: RE | Admit: 2020-11-23 | Discharge: 2020-11-23 | Disposition: A | Discharge status: Home / Self Care | Payer: Medicare Other | Source: Ambulatory Visit | Attending: Interventional Radiology | Admitting: Interventional Radiology

## 2020-11-23 ENCOUNTER — Other Ambulatory Visit: Payer: Self-pay

## 2020-11-23 DIAGNOSIS — I871 Compression of vein: Secondary | ICD-10-CM

## 2020-11-27 ENCOUNTER — Ambulatory Visit
Admission: RE | Admit: 2020-11-27 | Discharge: 2020-11-27 | Disposition: A | Discharge status: Home / Self Care | Payer: Medicare Other | Source: Ambulatory Visit | Attending: Interventional Radiology | Admitting: Interventional Radiology

## 2020-11-27 ENCOUNTER — Other Ambulatory Visit: Payer: Self-pay

## 2020-11-27 ENCOUNTER — Encounter: Payer: Self-pay | Admitting: *Deleted

## 2020-11-27 DIAGNOSIS — I878 Other specified disorders of veins: Secondary | ICD-10-CM | POA: Diagnosis not present

## 2020-11-27 DIAGNOSIS — Z452 Encounter for adjustment and management of vascular access device: Secondary | ICD-10-CM | POA: Diagnosis not present

## 2020-11-27 DIAGNOSIS — I82211 Chronic embolism and thrombosis of superior vena cava: Secondary | ICD-10-CM | POA: Diagnosis not present

## 2020-11-27 HISTORY — PX: IR RADIOLOGIST EVAL & MGMT: IMG5224

## 2020-11-27 NOTE — Progress Notes (Signed)
Patient ID: [name] [name], female   DOB: [birth year], 70 y.o.   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  Patient was admitted to the hospital In mid June with symptoms of bacteremia, ultimately requiring a catheter holiday and removal of chronic left jugular approach central venous catheter.   Unfortunately, attempt at replacement of the left internal jugular procedure venous catheter confirmed left-sided central venous obstruction and as such patient underwent placement of a right common femoral approach tunneled central venous catheter for durable intravenous access needs.  Patient has subsequently been evaluated with SVC protocol chest CT performed 11/08/2020 and is seen today in telemedicine consultation for evaluation for potential central venous recanalization for the purposes of reestablishing a neck/chest wall central venous catheter.   The patient states that she has tolerating the right thigh catheter fairly well though does admit it is more of a nuisance than her chest wall catheters.  Patient is seen today in telemedicine consultation (accompanied on the phone call by her husband) however unfortunately has experienced a prolonged course of COVID with excessive fatigue and is currently unable to tolerate an invasive procedure.    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   Fracture of left clavicle    History of blood transfusion 2013   anemia   Hx of cardiovascular stress test    Myoview (9/15):  inf-apical scar; no ischemia; EF 47% - low risk    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 12/12/2011   Osteoporosis    Pancytopenia 10/07/2011   Pathologic fracture of neck of femur ([place])    Personal history of colonic polyps    Renal insufficiency    hx of yrs ago   Serratia marcescens infection - bactermia assoc w/ PICC 01/18/2015   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HCC)    Splenomegaly    By ultrasound   Stroke ([place]) 07/2020   Thrombophilia ([place])    Vitamin D deficiency     Past Surgical History:  Procedure Laterality Date   APPENDECTOMY  yrs ago   CHOLECYSTECTOMY  yrs ago   COLONOSCOPY  12/05/2005   internal hemorrhoids (for polyp surveillance)   COLONOSCOPY  04/26/2012   Procedure: COLONOSCOPY;  Surgeon: Gatha Mayer, MD;  Location: [facility];  Service: Endoscopy;  Laterality: N/A;   COLONOSCOPY WITH PROPOFOL N/A 03/18/2016   Procedure: COLONOSCOPY WITH PROPOFOL;  Surgeon: Gatha Mayer, MD;  Location: [facility];  Service: Endoscopy;  Laterality: N/A;   ESOPHAGOGASTRODUODENOSCOPY  01/22/2009   erosive esophagitis   HARDWARE REMOVAL Left 12/31/2017   Procedure: HARDWARE REMOVAL;  Surgeon: Rod Can, MD;  Location: [facility];  Service: Orthopedics;  Laterality: Left;   INTRAMEDULLARY (IM) NAIL INTERTROCHANTERIC Left 12/31/2017   Procedure: INTRAMEDULLARY (IM) NAIL SUBTROCHANTRIC;  Surgeon: Rod Can, MD;  Location: [facility];  Service: Orthopedics;  Laterality: Left;   IR CV LINE INJECTION  03/23/2018   IR FLUORO GUIDE CV LINE LEFT  01/01/2018   IR FLUORO GUIDE CV LINE LEFT  03/24/2018   IR FLUORO GUIDE CV LINE LEFT  05/21/2018   IR FLUORO GUIDE CV LINE LEFT  07/09/2018   IR FLUORO GUIDE CV LINE RIGHT  11/01/2020   IR GENERIC HISTORICAL  07/04/2016   IR REMOVAL TUN CV CATH W/O FL 07/04/2016 Ascencion Dike, PA-C [facility]   IR GENERIC HISTORICAL  07/10/2016   IR US GUIDE VASC ACCESS LEFT 07/10/2016 Arne Cleveland, MD [facility]   IR GENERIC HISTORICAL  07/10/2016   IR FLUORO GUIDE CV LINE LEFT 07/10/2016 Arne Cleveland, MD [facility]   IR PATIENT EVAL TECH 0-60 MINS  10/07/2017   IR PATIENT EVAL TECH 0-60 MINS  03/09/2018   IR RADIOLOGIST EVAL & MGMT  07/21/2017   IR RADIOLOGIST EVAL & MGMT  11/14/2020   IR US GUIDE VASC ACCESS LEFT  11/01/2020   IR US GUIDE VASC ACCESS RIGHT  11/01/2020   IR VENO/EXT/UNI LEFT  11/01/2020   LEFT HEART CATH AND CORONARY ANGIOGRAPHY N/A 06/26/2020   Procedure: LEFT HEART CATH AND CORONARY ANGIOGRAPHY;  Surgeon: Jolaine Artist, MD;  Location: [facility];  Service: Cardiovascular;  Laterality: N/A;   ORIF PROXIMAL FEMORAL FRACTURE W/ ITST NAIL SYSTEM  03/2007   left, Dr. Shellia Carwin   SMALL INTESTINE SURGERY  2005   multiple with right colon resection for ischemia/infarct    Allergies: Penicillins  Medications: Prior to Admission medications   Medication Sig Start Date End Date Taking? Authorizing Provider  acetaminophen (TYLENOL) 500 MG tablet Take 1,000 mg by mouth daily as needed for mild pain.    [provider]  ADULT TPN Inject 1,800 mLs into the vein 4 (four) times a week. Pt receives home TPN from [facility]:  1800 mL bag, four nights weekly (Monday, Tuesday, Wednesday, Thursday for 8 hours (includes 1 hour taper up and down).    [provider]  baclofen (LIORESAL) 10 MG tablet Take 1 tablet (10 mg total) by mouth 2  (two) times daily as needed for muscle spasms. 08/29/20   Frann Rider, NP  Calcium Carb-Cholecalciferol (CALCIUM + D3 PO) Take 2 tablets by mouth daily.    [provider]  cetirizine (ZYRTEC) 10 MG tablet Take 10 mg by mouth at bedtime as needed for allergies.    [provider]  diphenhydrAMINE (BENADRYL) 25 mg capsule Take 25-50 mg by mouth every 6 (six) hours as needed for itching or sleep.     [provider]  fluticasone (FLONASE) 50 MCG/ACT nasal spray Place 2 sprays into both nostrils daily as needed for allergies or rhinitis.    [provider]  furosemide (LASIX) 20 MG tablet TAKE ONE TABLET BY MOUTH AS NEEDED FOR SWELLING 08/01/20   Bensimhon, Shaune Pascal, MD  Heparin Sodium, Porcine, (HEPARIN LOCK FLUSH IJ) Inject 5 mLs as directed 4 (four) times a week. Monday, Tuesday, Wednesday,  Thursday in the morning with TPN    [provider]  Multiple Vitamins-Minerals (ADULT GUMMY PO) Take 2 tablets by mouth daily.    [provider]  omeprazole (PRILOSEC) 40 MG capsule Take 40 mg by mouth daily.    [provider]  ondansetron (ZOFRAN-ODT) 8 MG disintegrating tablet TAKE 1 TABLET (8 MG TOTAL) BY MOUTH EVERY 8 (EIGHT) HOURS AS NEEDED FOR NAUSEA OR VOMITING. 11/21/20   Gatha Mayer, MD  sacubitril-valsartan (ENTRESTO) 24-26 MG Take 1 tablet by mouth 2 (two) times daily. 05/09/20   Josue Hector, MD  sodium chloride 0.9 % infusion Inject 900 mLs into the vein every 14 (fourteen) days. Uses for TPN 10/27/17   [provider]  spironolactone (ALDACTONE) 25 MG tablet Take 1 tablet (25 mg total) by mouth daily. 07/04/20   Bensimhon, Shaune Pascal, MD  Teduglutide, rDNA, 5 MG KIT Inject 3.1 application into the skin every morning.    [provider]  triamcinolone ointment (KENALOG) 0.1 % Apply 1 application topically 2 (two) times daily. 10/15/20 10/15/21  [name], MD  vitamin E 1000 UNIT capsule Take 1,000 Units by  mouth daily.    [provider]  XARELTO 20 MG TABS tablet TAKE 1 TABLET (20 MG TOTAL) BY MOUTH DAILY WITH SUPPER. 11/13/20   [name], MD     Family History  Problem Relation Age of Onset   Diabetes Mother    Hypertension Mother    AAA (abdominal aortic aneurysm) Mother    Colon cancer Neg Hx    Stomach cancer Neg Hx     Social History   Socioeconomic History   Marital status: Married    Spouse name: Not on file   Number of children: Not on file   Years of education: Not on file   Highest education level: Not on file  Occupational History   Not on file  Tobacco Use   Smoking status: Former    Types: Cigarettes    Quit date: 05/12/2004    Years since quitting: 16.5   Smokeless tobacco: Never  Vaping Use   Vaping Use: Never used  Substance and Sexual Activity   Alcohol use: No   Drug use: No   Sexual activity: Yes    Comment: 1st intercourse 65 yo-5 partners  Other Topics Concern   Not on file  Social History Narrative   Married to [place], has 1 daughter and a granddaughter the patient's son died in childhood due to a motor vehicle wreck   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   No alcohol tobacco or drug use   04/07/2017   Social Determinants of Health   Financial Resource Strain: Not on file  Food Insecurity: Not on file  Transportation Needs: No Transportation Needs   Lack of Transportation (Medical): No   Lack of Transportation (Non-Medical): No  Physical Activity: Not on file  Stress: Not on file  Social Connections: Not on file    ECOG Status: 2 - Symptomatic, <50% confined to bed  Review of Systems  Review of Systems: A 12 point ROS discussed and pertinent positives are indicated in the HPI above.  All other systems are negative.  Physical Exam No direct physical exam was performed (except for noted visual exam findings with Video Visits).  Vital Signs: LMP 02/26/2014   Imaging: CT  CHEST W CONTRAST  Result Date: 11/08/2020 CLINICAL DATA:  History of short gut syndrome  with chronic central venous catheters for TPN administration. Patient was recently admitted to the hospital with bacteremia requiring a catheter holiday however a jugular approach intravenous catheter could not be placed secondary to apparent SVC occlusion. Please perform SVC protocol chest CT 4 procedural planning purposes prior to consideration of attempted central venous recanalization. EXAM: CT CHEST WITH CONTRAST TECHNIQUE: Multidetector CT imaging of the chest was performed during intravenous contrast administration. CONTRAST:  136mL ISOVUE-300 IOPAMIDOL (ISOVUE-300) INJECTION 61% COMPARISON:  Chest CT-10/24/2011 FINDINGS: Cardiovascular: There is chronic occlusion of the SVC, bilateral innominate veins and central aspect of the right subclavian vein. The length of the occluded segment of the SVC through the central aspect the right subclavian vein measures approximately 5 cm (coronal images 72 through 80, series 4; sagittal image 98, series 6). This finding is associated with hypertrophy of the azygos system as well as development of multiple paraspinal and intercostal venous collaterals. Additionally, mildly hypertrophied venous collaterals are seen about the left lateral chest wall bilaterally. The bilateral external jugular veins are patent, the right is larger left. 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 The left internal jugular vein remains patent. Scattered atherosclerotic plaque within normal caliber thoracic aorta. Conventional configuration of the aortic arch. The branch vessels of the aortic arch appear widely patent throughout their imaged courses. Borderline cardiomegaly. Coronary artery calcifications. No pericardial effusion. Although this examination was not tailored for the evaluation the pulmonary arteries, there are no discrete filling defects within the central  pulmonary arterial tree to suggest central pulmonary embolism. Normal caliber of the main pulmonary artery. Mediastinum/Nodes: Mildly prominent precarinal lymph node is not enlarged by size criteria measuring 7 mm greatest short axis diameter (image 65, series 2, presumably reactive in etiology. No bulky mediastinal, hilar or axillary lymphadenopathy. Lungs/Pleura: Ill-defined ground-glass nodular opacities are seen within bilateral apices, each measuring 7 mm in diameter (left - image 25, series 8; right - image 51, series 8), neither seen on remote chest CT performed in 2013. Minimal amount geographic pleuroparenchymal thickening about the anterior aspect the right minor fissure (image 86, series 8). No discrete focal airspace opacities. No pleural effusion or pneumothorax. The central pulmonary airways appear widely patent. Upper Abdomen: Diffuse decreased attenuation of the hepatic parenchyma this postcontrast examination suggestive of hepatic steatosis. Post cholecystectomy. Additional surgical clip is noted adjacent to the dome of the right lobe of the liver. The tip of right common femoral vein central venous catheter terminates within the intrahepatic IVC. There is chronic occlusion of the SMA with mixed calcified and noncalcified atherosclerotic plaque involving the origin the celiac artery likely resulting in at least a 50% luminal narrowing. Musculoskeletal: No acute or aggressive osseous abnormalities. Old fracture involving the surgical neck of the right humerus with residual deformity IMPRESSION: 1. Chronic occlusion of the SVC, bilateral innominate veins, the central most aspect of the right subclavian vein and the right internal jugular vein, the sequela of previous chronic central venous catheters. 2. Hypertrophy of the azygos system with development of multiple paraspinal, intercostal and bilateral chest wall venous collaterals. 3. 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 4. Ill-defined ground-glass nodular opacities within the bilateral lung apices measuring approximately 7 mm in diameter. Comparison to recent prior outside examinations (if available), is advised otherwise, a non-contrast chest CT at 3-6 months is recommended. If nodules persist, subsequent management will be based upon the most suspicious nodule(s).  This recommendation follows the consensus statement: Guidelines for Management of Incidental Pulmonary Nodules Detected on CT Images: From the Fleischner Society 2017; Radiology 2017; 284:228-243. Electronically Signed   By: Sandi Mariscal M.D.   On: 11/08/2020 14:17   IR Veno/Ext/Uni Left  Result Date: 11/02/2020 INDICATION: History of short gut syndrome (secondary to ischemic bowel) with multiple chronic tunneled central venous catheters for long-term TPN administration. Patient was admitted to the hospital with presumed catheter related bacteremia subsequently undergoing catheter removal for the purposes of a catheter holiday. Patient with history of right subclavian vein occlusion demonstrated on left internal jugular approach tunneled central venous catheter placement performed 07/10/2016. EXAM: 1. ULTRASOUND GUIDED ACCESS OF THE LEFT INTERNAL JUGULAR VEIN 2. LEFT INTERNAL JUGULAR VENOGRAM WITH ATTEMPTED CENTRAL VENOUS RECANALIZATION 3. ULTRASOUND AND FLUOROSCOPIC GUIDED PLACEMENT OF RIGHT COMMON FEMORAL VEIN APPROACH TUNNELED CENTRAL VENOUS CATHETER MEDICATIONS: None. The antibiotic was given in an appropriate time interval prior to skin puncture. ANESTHESIA/SEDATION: None COMPARISON:  Multiple previous left jugular approach central venous catheter exchanges, most recently on 07/09/2018. Attempted right internal jugular approach central venous catheter placement-07/11/2026 FLUOROSCOPY TIME:  9 minutes, 36 seconds (55 mGy) COMPLICATIONS: None immediate. PROCEDURE: Informed written consent was obtained from the patient  after a discussion of the risks, benefits, and alternatives to treatment. Questions regarding the procedure were encouraged and answered. 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 The left internal jugular vein was noted to be patent and as such the left neck and chest were prepped with chlorhexidine in a sterile fashion, and a sterile drape was applied covering the operative field. Maximum barrier sterile technique with sterile gowns and gloves were used for the procedure. A timeout was performed prior to the initiation of the procedure. After the overlying soft tissues were anesthetized, a small venotomy incision was created and a micropuncture kit was utilized to access the internal jugular vein. Real-time ultrasound guidance was utilized for vascular access including the acquisition of a permanent ultrasound image documenting patency of the accessed vessel. As there was difficulty advancing the microwire centrally, a internal jugular venogram was performed via the micropuncture sheath, occlusion of the central aspect of the left subclavian vein. Over a guidewire, the micropuncture sheath was exchanged for a Kumpe catheter and efforts were made with both a regular stiff Glidewire to recannulate the occluded left upper extremity central venous system however this ultimately proved unsuccessful Given known history of right-sided central venous occlusion decision was made to proceed with right common femoral vein approach tunneled catheter placement. As such, the right groin was prepped and draped in usual sterile fashion. After the overlying soft tissues were anesthetized with 1% lidocaine, the right common femoral vein was accessed with a micropuncture kit after lying soft tissues were anesthetized with 1% lidocaine. The microwire was utilized to measure appropriate catheter length. The micropuncture sheath was  exchanged for a peel-away sheath over a guidewire. A 5 French single lumen tunneled central venous catheter measuring 45 cm was tunneled in a retrograde fashion from the right lateral thigh to the venotomy incision. The catheter was then placed through the peel-away sheath with tip ultimately positioned at the inferior caval-atrial junction. Final catheter positioning was confirmed and documented with a spot fluoroscopic images. The catheter aspirates and flushes normally. The catheter was flushed with appropriate volume heparin dwells. The catheter exit site was secured with a 0-Prolene retention suture. The venotomy incision was closed with an interrupted 4-0  Vicryl, Dermabond and Steri-strips. Dressings were applied. The patient tolerated the procedure well without immediate post procedural complication. FINDINGS: While the left internal jugular vein remains patent, there is occlusion of the central aspect of the left subclavian vein which was refractory to attempted percutaneous recanalization. After catheter placement, the right common femoral vein approach tunneled central venous catheter tip lies within the inferior cavoatrial junction. The catheter aspirates and flushes normally and is ready for immediate use. IMPRESSION: 1. Successful placement of right common femoral approach 45 cm single lumen tunneled central venous catheter with tip terminating within the inferior caval atrial junction. The catheter is ready for immediate use. 2. 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 3. 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 PLAN: - Above was discussed with patient's providing gastroenterologist, Dr. Carlean Purl. - The patient is to be discharged from the hospital later today and as such we will coordinate  obtain an outpatient contrast-enhanced chest CT to evaluate for feasibility of attempted upper extremity central venous recanalization. Patient will be seen in follow-up telemedicine consultation following the acquisition of the contrast-enhanced chest CT. Electronically Signed   By: Sandi Mariscal M.D.   On: 11/02/2020 11:57   IR Fluoro Guide CV Line Right  Result Date: 11/02/2020 INDICATION: History of short gut syndrome (secondary to ischemic bowel) with multiple chronic tunneled central venous catheters for long-term TPN administration. Patient was admitted to the hospital with presumed catheter related bacteremia subsequently undergoing catheter removal for the purposes of a catheter holiday. Patient with history of right subclavian vein occlusion demonstrated on left internal jugular approach tunneled central venous catheter placement performed 07/10/2016. EXAM: 1. ULTRASOUND GUIDED ACCESS OF THE LEFT INTERNAL JUGULAR VEIN 2. LEFT INTERNAL JUGULAR VENOGRAM WITH ATTEMPTED CENTRAL VENOUS RECANALIZATION 3. ULTRASOUND AND FLUOROSCOPIC GUIDED PLACEMENT OF RIGHT COMMON FEMORAL VEIN APPROACH TUNNELED CENTRAL VENOUS CATHETER MEDICATIONS: None. The antibiotic was given in an appropriate time interval prior to skin puncture. ANESTHESIA/SEDATION: None COMPARISON:  Multiple previous left jugular approach central venous catheter exchanges, most recently on 07/09/2018. Attempted right internal jugular approach central venous catheter placement-07/11/2026 FLUOROSCOPY TIME:  9 minutes, 36 seconds (55 mGy) COMPLICATIONS: None immediate. PROCEDURE: Informed written consent was obtained from the patient after a discussion of the risks, benefits, and alternatives to treatment. Questions regarding the procedure were encouraged and answered. 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 The left internal jugular vein was noted to be  patent and as such the left neck and chest were prepped with chlorhexidine in a sterile fashion, and a sterile drape was applied covering the operative field. Maximum barrier sterile technique with sterile gowns and gloves were used for the procedure. A timeout was performed prior to the initiation of the procedure. After the overlying soft tissues were anesthetized, a small venotomy incision was created and a micropuncture kit was utilized to access the internal jugular vein. Real-time ultrasound guidance was utilized for vascular access including the acquisition of a permanent ultrasound image documenting patency of the accessed vessel. As there was difficulty advancing the microwire centrally, a internal jugular venogram was performed via the micropuncture sheath, occlusion of the central aspect of the left subclavian vein. Over a guidewire, the micropuncture sheath was exchanged for a Kumpe catheter and efforts were made with both a regular stiff  Glidewire to recannulate the occluded left upper extremity central venous system however this ultimately proved unsuccessful Given known history of right-sided central venous occlusion decision was made to proceed with right common femoral vein approach tunneled catheter placement. As such, the right groin was prepped and draped in usual sterile fashion. After the overlying soft tissues were anesthetized with 1% lidocaine, the right common femoral vein was accessed with a micropuncture kit after lying soft tissues were anesthetized with 1% lidocaine. The microwire was utilized to measure appropriate catheter length. The micropuncture sheath was exchanged for a peel-away sheath over a guidewire. A 5 French single lumen tunneled central venous catheter measuring 45 cm was tunneled in a retrograde fashion from the right lateral thigh to the venotomy incision. The catheter was then placed through the peel-away sheath with tip ultimately positioned at the inferior  caval-atrial junction. Final catheter positioning was confirmed and documented with a spot fluoroscopic images. The catheter aspirates and flushes normally. The catheter was flushed with appropriate volume heparin dwells. The catheter exit site was secured with a 0-Prolene retention suture. The venotomy incision was closed with an interrupted 4-0 Vicryl, Dermabond and Steri-strips. Dressings were applied. The patient tolerated the procedure well without immediate post procedural complication. FINDINGS: While the left internal jugular vein remains patent, there is occlusion of the central aspect of the left subclavian vein which was refractory to attempted percutaneous recanalization. After catheter placement, the right common femoral vein approach tunneled central venous catheter tip lies within the inferior cavoatrial junction. The catheter aspirates and flushes normally and is ready for immediate use. IMPRESSION: 1. Successful placement of right common femoral approach 45 cm single lumen tunneled central venous catheter with tip terminating within the inferior caval atrial junction. The catheter is ready for immediate use. 2. 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 3. 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 PLAN: - Above was discussed with patient's providing gastroenterologist, Dr. Carlean Purl. - The patient is to be discharged from the hospital later today and as such we will coordinate obtain an outpatient contrast-enhanced chest CT to evaluate for feasibility of attempted upper extremity central venous recanalization. Patient will be seen in follow-up telemedicine consultation following the acquisition of the contrast-enhanced chest CT. Electronically Signed   By: Sandi Mariscal M.D.   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 Patient was admitted to the hospital with presumed catheter related bacteremia subsequently undergoing catheter removal for the purposes of a catheter holiday. Patient with history of right subclavian vein occlusion demonstrated on left internal jugular approach tunneled central venous catheter placement performed 07/10/2016. EXAM: 1. ULTRASOUND GUIDED ACCESS OF THE LEFT INTERNAL JUGULAR VEIN 2. LEFT INTERNAL JUGULAR VENOGRAM WITH ATTEMPTED CENTRAL VENOUS RECANALIZATION 3. ULTRASOUND AND FLUOROSCOPIC GUIDED PLACEMENT OF RIGHT COMMON FEMORAL VEIN APPROACH TUNNELED CENTRAL VENOUS CATHETER MEDICATIONS: None. The antibiotic was given in an appropriate time interval prior to skin puncture. ANESTHESIA/SEDATION: None COMPARISON:  Multiple previous left jugular approach central venous catheter exchanges, most recently on 07/09/2018. Attempted right internal jugular approach central venous catheter placement-07/11/2026 FLUOROSCOPY TIME:  9 minutes, 36 seconds (55 mGy) COMPLICATIONS: None immediate. PROCEDURE: Informed written consent was obtained from the  patient after a discussion of the risks, benefits, and alternatives to treatment. Questions regarding the procedure were encouraged and answered. 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 The left internal jugular vein was noted to be patent and as such the left neck and chest were prepped with chlorhexidine in a sterile fashion, and a sterile drape was applied covering the operative field. Maximum barrier sterile technique with sterile gowns and gloves were used for the procedure. A timeout was performed prior to the initiation of the procedure. After  the overlying soft tissues were anesthetized, a small venotomy incision was created and a micropuncture kit was utilized to access the internal jugular vein. Real-time ultrasound guidance was utilized for vascular access including the acquisition of a permanent ultrasound image documenting patency of the accessed vessel. As there was difficulty advancing the microwire centrally, a internal jugular venogram was performed via the micropuncture sheath, occlusion of the central aspect of the left subclavian vein. Over a guidewire, the micropuncture sheath was exchanged for a Kumpe catheter and efforts were made with both a regular stiff Glidewire to recannulate the occluded left upper extremity central venous system however this ultimately proved unsuccessful Given known history of right-sided central venous occlusion decision was made to proceed with right common femoral vein approach tunneled catheter placement. As such, the right groin was prepped and draped in usual sterile fashion. After the overlying soft tissues were anesthetized with 1% lidocaine, the right common femoral vein was accessed with a micropuncture kit after lying soft tissues were anesthetized with 1% lidocaine. The microwire was utilized to measure appropriate catheter length. The micropuncture sheath was exchanged for a peel-away sheath over a guidewire. A 5 French single lumen tunneled central venous catheter measuring 45 cm was tunneled in a retrograde fashion from the right lateral thigh to the venotomy incision. The catheter was then placed through the peel-away sheath with tip ultimately positioned at the inferior caval-atrial junction. Final catheter positioning was confirmed and documented with a spot fluoroscopic images. The catheter aspirates and flushes normally. The catheter was flushed with appropriate volume heparin dwells. The catheter exit site was secured with a 0-Prolene retention suture. The venotomy incision was closed with an  interrupted 4-0 Vicryl, Dermabond and Steri-strips. Dressings were applied. The patient tolerated the procedure well without immediate post procedural complication. FINDINGS: While the left internal jugular vein remains patent, there is occlusion of the central aspect of the left subclavian vein which was refractory to attempted percutaneous recanalization. After catheter placement, the right common femoral vein approach tunneled central venous catheter tip lies within the inferior cavoatrial junction. The catheter aspirates and flushes normally and is ready for immediate use. IMPRESSION: 1. Successful placement of right common femoral approach 45 cm single lumen tunneled central venous catheter with tip terminating within the inferior caval atrial junction. The catheter is ready for immediate use. 2. 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 3. 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 PLAN: - Above was discussed with patient's providing gastroenterologist, Dr. Carlean Purl. - The patient is to be discharged from the hospital later today and as such we will coordinate obtain an outpatient contrast-enhanced chest CT to evaluate for feasibility of attempted upper extremity central venous recanalization. Patient will be seen in follow-up telemedicine consultation following the  acquisition of the contrast-enhanced chest CT. Electronically Signed   By: Sandi Mariscal M.D.   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 Patient was admitted to the hospital with presumed catheter related bacteremia subsequently undergoing  catheter removal for the purposes of a catheter holiday. Patient with history of right subclavian vein occlusion demonstrated on left internal jugular approach tunneled central venous catheter placement performed 07/10/2016. EXAM: 1. ULTRASOUND GUIDED ACCESS OF THE LEFT INTERNAL JUGULAR VEIN 2. LEFT INTERNAL JUGULAR VENOGRAM WITH ATTEMPTED CENTRAL VENOUS RECANALIZATION 3. ULTRASOUND AND FLUOROSCOPIC GUIDED PLACEMENT OF RIGHT COMMON FEMORAL VEIN APPROACH TUNNELED CENTRAL VENOUS CATHETER MEDICATIONS: None. The antibiotic was given in an appropriate time interval prior to skin puncture. ANESTHESIA/SEDATION: None COMPARISON:  Multiple previous left jugular approach central venous catheter exchanges, most recently on 07/09/2018. Attempted right internal jugular approach central venous catheter placement-07/11/2026 FLUOROSCOPY TIME:  9 minutes, 36 seconds (55 mGy) COMPLICATIONS: None immediate. PROCEDURE: Informed written consent was obtained from the patient after a discussion of the risks, benefits, and alternatives to treatment. Questions regarding the procedure were encouraged and answered. 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 The left internal jugular vein was noted to be patent and as such the left neck and chest were prepped with chlorhexidine in a sterile fashion, and a sterile drape was applied covering the operative field. Maximum barrier sterile technique with sterile gowns and gloves were used for the procedure. A timeout was performed prior to the initiation of the procedure. After the overlying soft tissues were anesthetized, a small venotomy incision was created and a micropuncture kit was utilized to access the internal jugular vein. Real-time ultrasound guidance was utilized for vascular access including the acquisition of a permanent ultrasound image documenting patency of the accessed vessel. As  there was difficulty advancing the microwire centrally, a internal jugular venogram was performed via the micropuncture sheath, occlusion of the central aspect of the left subclavian vein. Over a guidewire, the micropuncture sheath was exchanged for a Kumpe catheter and efforts were made with both a regular stiff Glidewire to recannulate the occluded left upper extremity central venous system however this ultimately proved unsuccessful Given known history of right-sided central venous occlusion decision was made to proceed with right common femoral vein approach tunneled catheter placement. As such, the right groin was prepped and draped in usual sterile fashion. After the overlying soft tissues were anesthetized with 1% lidocaine, the right common femoral vein was accessed with a micropuncture kit after lying soft tissues were anesthetized with 1% lidocaine. The microwire was utilized to measure appropriate catheter length. The micropuncture sheath was exchanged for a peel-away sheath over a guidewire. A 5 French single lumen tunneled central venous catheter measuring 45 cm was tunneled in a retrograde fashion from the right lateral thigh to the venotomy incision. The catheter was then placed through the peel-away sheath with tip ultimately positioned at the inferior caval-atrial junction. Final catheter positioning was confirmed and documented with a spot fluoroscopic images. The catheter aspirates and flushes normally. The catheter was flushed with appropriate volume heparin dwells. The catheter exit site was secured with a 0-Prolene retention suture. The venotomy incision was closed with an interrupted 4-0 Vicryl, Dermabond and Steri-strips. Dressings were applied. The patient tolerated the procedure well without immediate post  procedural complication. FINDINGS: While the left internal jugular vein remains patent, there is occlusion of the central aspect of the left subclavian vein which was refractory to  attempted percutaneous recanalization. After catheter placement, the right common femoral vein approach tunneled central venous catheter tip lies within the inferior cavoatrial junction. The catheter aspirates and flushes normally and is ready for immediate use. IMPRESSION: 1. Successful placement of right common femoral approach 45 cm single lumen tunneled central venous catheter with tip terminating within the inferior caval atrial junction. The catheter is ready for immediate use. 2. 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 3. 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 PLAN: - Above was discussed with patient's providing gastroenterologist, Dr. Carlean Purl. - The patient is to be discharged from the hospital later today and as such we will coordinate obtain an outpatient contrast-enhanced chest CT to evaluate for feasibility of attempted upper extremity central venous recanalization. Patient will be seen in follow-up telemedicine consultation following the acquisition of the contrast-enhanced chest CT. Electronically Signed   By: Sandi Mariscal M.D.   On: 11/02/2020 11:57   IR Radiologist Eval & Mgmt  Result Date: 11/15/2020 Please refer to notes tab for details about interventional procedure. (Op Note)   Labs:  CBC: Recent Labs    10/28/20 0447 10/29/20 0447 10/30/20 0429 11/01/20 0409  WBC 4.6 4.4 5.3 6.7  HGB 8.0* 8.2* 8.7* 9.8*  HCT 26.2* 27.3* 30.7* 32.7*  PLT 62* 76* 84* 104*    COAGS: Recent Labs    07/23/20 1920  INR 1.8*  APTT 35    BMP: Recent Labs    10/27/20 0947 10/28/20 0447 10/29/20 0447 11/01/20 0409  NA 136 137 138 139  K 3.5 3.4* 3.8 3.1*  CL 108 113* 117* 119*  CO2 22 18* 17* 17*  GLUCOSE 91 74 78 75  BUN 31* 23 13 7*  CALCIUM 8.1* 7.8* 8.1* 8.0*   CREATININE 1.20* 1.31* 1.12* 1.23*  GFRNONAA 49* 44* 53* 47*    LIVER FUNCTION TESTS: Recent Labs    10/10/20 1051 10/27/20 0947 10/28/20 0447 10/29/20 0447  BILITOT 0.5 0.6 0.3 0.5  AST $Re'14 19 18 18  'oyF$ ALT $R'13 16 16 14  'KA$ ALKPHOS 98 89 77 87  PROT 6.5 6.9 5.9* 6.4*  ALBUMIN 2.8* 2.8* 2.3* 2.5*    TUMOR MARKERS: No results for input(s): AFPTM, CEA, CA199, CHROMGRNA in the last 8760 hours.  Assessment and Plan:  [name] [name] is a 70 y.o. 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  Unfortunately she has experienced a prolonged course of COVID with excessive fatigue and is currently unable to tolerate an invasive procedure.  Regardless, potential treatment options were again discussed with the patient and patient's husband as follows: - Maintaining a femoral approach central venous catheter - I explained that femoral catheters have higher rates of injection and as she has developed catheter related occlusions, I would be concerned that long-term femoral catheter may result in either right lower extremity DVT or conceivably caval thrombosis, however ultimately this may  be her best option for the foreseeable future.  (Note, I would reserve placement of a transhepatic catheter ONLY once pt has experience a complication related to her femoral catheter)   - Attempted left-sided approach central venous recanalization - I explained that if we were successful in reestablishing access from a left-sided approach, the procedure would entail central venous angioplasty and stent placement prior to proceeding with definitive tunneled catheter  placement.  I explained the procedure would be performed at [facility] with conscious sedation.  I explained the largest risk associate with the procedure would be central venous bleeding (hence the reason why procedure will be performed at [facility] where the cardiothoracic surgeons practice).  Other risk associated with central venous recanalization includes (but is not limited to) infection and/or early stent rethrombosis.   - Referral to tertiary medical center for evaluation -I explained that ultimately we may wish to refer the patient to [facility] as they have access to an RF ablation wire which may be required for establishing central venous recanalization and I could help to facilitate this consultation if she were to choose to forego an attempt by our service.  The patient demonstrated excellent understanding of the above potential treatment options however is currently unable to undergo procedure due to her continued recovery from [place] and as such, will call the interventional radiology clinic to finalize potential treatment plan when she feels she has fully recovered.  Plan: - The patient will call the interventional radiology clinic [phone]) to finalize potential treatment options once she feels she is fully recovered from [place].  A copy of this report was sent to the requesting provider on this date.  Electronically Signed: Sandi Mariscal 11/27/2020, 1:13 PM   I spent a total of 15 Minutes in remote  clinical consultation, greater than 50% of which was counseling/coordinating care for central venous catheter evaluation and manage.    Visit type: Audio only (telephone). Audio (no video) only due to patient's lack of internet/smartphone capability. Alternative for in-person consultation at [facility], [address] [place], [place], [place]. This visit type was conducted due to national recommendations for restrictions regarding the COVID-19 Pandemic  (e.g. social distancing).  This format is felt to be most appropriate for this patient at this time.  All issues noted in this document were discussed and addressed.

## 2020-11-29 ENCOUNTER — Ambulatory Visit: Payer: Medicare Other | Admitting: Adult Health

## 2020-12-07 ENCOUNTER — Encounter: Payer: Self-pay | Admitting: Adult Health

## 2020-12-07 ENCOUNTER — Inpatient Hospital Stay (HOSPITAL_BASED_OUTPATIENT_CLINIC_OR_DEPARTMENT_OTHER): Payer: Medicare Other | Admitting: Adult Health

## 2020-12-07 ENCOUNTER — Other Ambulatory Visit: Payer: Self-pay

## 2020-12-07 ENCOUNTER — Inpatient Hospital Stay: Payer: Medicare Other

## 2020-12-07 VITALS — BP 92/58 | HR 71 | Temp 97.8°F | Resp 18 | Ht 68.0 in | Wt 130.7 lb

## 2020-12-07 DIAGNOSIS — D696 Thrombocytopenia, unspecified: Secondary | ICD-10-CM

## 2020-12-07 DIAGNOSIS — D508 Other iron deficiency anemias: Secondary | ICD-10-CM

## 2020-12-07 DIAGNOSIS — D638 Anemia in other chronic diseases classified elsewhere: Secondary | ICD-10-CM

## 2020-12-07 DIAGNOSIS — K912 Postsurgical malabsorption, not elsewhere classified: Secondary | ICD-10-CM | POA: Diagnosis not present

## 2020-12-07 LAB — CBC WITH DIFFERENTIAL (CANCER CENTER ONLY)
Abs Immature Granulocytes: 0.02 10*3/uL (ref 0.00–0.07)
Basophils Absolute: 0.1 10*3/uL (ref 0.0–0.1)
Basophils Relative: 1 %
Eosinophils Absolute: 0.6 10*3/uL — ABNORMAL HIGH (ref 0.0–0.5)
Eosinophils Relative: 9 %
HCT: 27 % — ABNORMAL LOW (ref 36.0–46.0)
Hemoglobin: 8.9 g/dL — ABNORMAL LOW (ref 12.0–15.0)
Immature Granulocytes: 0 %
Lymphocytes Relative: 37 %
Lymphs Abs: 2.4 10*3/uL (ref 0.7–4.0)
MCH: 30.4 pg (ref 26.0–34.0)
MCHC: 33 g/dL (ref 30.0–36.0)
MCV: 92.2 fL (ref 80.0–100.0)
Monocytes Absolute: 0.6 10*3/uL (ref 0.1–1.0)
Monocytes Relative: 10 %
Neutro Abs: 2.8 10*3/uL (ref 1.7–7.7)
Neutrophils Relative %: 43 %
Platelet Count: 93 10*3/uL — ABNORMAL LOW (ref 150–400)
RBC: 2.93 MIL/uL — ABNORMAL LOW (ref 3.87–5.11)
RDW: 14.9 % (ref 11.5–15.5)
WBC Count: 6.4 10*3/uL (ref 4.0–10.5)
nRBC: 0 % (ref 0.0–0.2)

## 2020-12-07 MED ORDER — DARBEPOETIN ALFA 300 MCG/0.6ML IJ SOSY
300.0000 ug | PREFILLED_SYRINGE | Freq: Once | INTRAMUSCULAR | Status: AC
  Administered 2020-12-07: 300 ug via SUBCUTANEOUS

## 2020-12-07 MED ORDER — DARBEPOETIN ALFA 300 MCG/0.6ML IJ SOSY
PREFILLED_SYRINGE | INTRAMUSCULAR | Status: AC
  Filled 2020-12-07: qty 0.6

## 2020-12-07 NOTE — Patient Instructions (Signed)
Ferric carboxymaltose injection What is this medication? FERRIC CARBOXYMALTOSE (FER ik car BOX ee MAWL tose) treats anemia in people with chronic kidney disease or people who cannot take iron by mouth. It works by increasing iron in your body which helps red blood cells deliver oxygen fromthe lungs to cells all over the body. This medicine may be used for other purposes; ask your health care provider orpharmacist if you have questions. COMMON BRAND NAME(S): Injectafer What should I tell my care team before I take this medication? They need to know if you have any of these conditions: High blood pressure High levels of iron in the blood An unusual or allergic reaction to iron, other medications, foods, dyes, or preservatives Pregnant or trying to get pregnant Breast-feeding How should I use this medication? This medication is injected into a vein. It is given by your care team in ahospital or clinic setting. Talk to your care team about the use of this medication in children. While it may be given to children as young as 1 year for selected conditions,precautions do apply. Overdosage: If you think you have taken too much of this medicine contact apoison control center or emergency room at once. NOTE: This medicine is only for you. Do not share this medicine with others. What if I miss a dose? Keep appointments for follow-up doses. It is important not to miss your dose.Call your care team if you are unable to keep an appointment. What may interact with this medication? Do not take this medication with any of the following medications: Deferoxamine Dimercaprol Other iron products This list may not describe all possible interactions. Give your health care provider a list of all the medicines, herbs, non-prescription drugs, or dietary supplements you use. Also tell them if you smoke, drink alcohol, or use illegaldrugs. Some items may interact with your medicine. What should I watch for while  using this medication? Visit your care team for regular checks on your progress. Tell your care teamif your symptoms do not start to get better or if they get worse. You may need blood work while you are taking this medication. You may need to eat more foods that contain iron. Talk to your care team. Foods that contain iron include whole grains/cereals, dried fruits, beans, or peas,leafy green vegetables, and organ meats (liver, kidney). What side effects may I notice from receiving this medication? Side effects that you should report to your care team as soon as possible: Allergic reactions-skin rash, itching, hives, swelling of the face, lips, tongue, or throat Dizziness Facial flushing, redness Increase in blood pressure Low phosphorous levels-loss of appetite, muscle weakness Side effects that usually do not require medical attention (report to your careteam if they continue or are bothersome): Change in taste Constipation Headache Nausea Pain, redness, or irritation at injection site Vomiting This list may not describe all possible side effects. Call your doctor for medical advice about side effects. You may report side effects to FDA at1-800-FDA-1088. Where should I keep my medication? This medication is given in a hospital or clinic. It will not be stored at home. NOTE: This sheet is a summary. It may not cover all possible information. If you have questions about this medicine, talk to your doctor, pharmacist, orhealth care provider.  2022 Elsevier/Gold Standard (2020-05-25 12:26:19)  

## 2020-12-07 NOTE — Patient Instructions (Signed)
Darbepoetin Alfa injection What is this medication? DARBEPOETIN ALFA (dar be POE e tin AL fa) helps your body make more red blood cells. It is used to treat anemia caused by chronic kidney failure and chemotherapy. This medicine may be used for other purposes; ask your health care provider or pharmacist if you have questions. COMMON BRAND NAME(S): Aranesp What should I tell my care team before I take this medication? They need to know if you have any of these conditions: blood clotting disorders or history of blood clots cancer patient not on chemotherapy cystic fibrosis heart disease, such as angina, heart failure, or a history of a heart attack hemoglobin level of 12 g/dL or greater high blood pressure low levels of folate, iron, or vitamin B12 seizures an unusual or allergic reaction to darbepoetin, erythropoietin, albumin, hamster proteins, latex, other medicines, foods, dyes, or preservatives pregnant or trying to get pregnant breast-feeding How should I use this medication? This medicine is for injection into a vein or under the skin. It is usually given by a health care professional in a hospital or clinic setting. If you get this medicine at home, you will be taught how to prepare and give this medicine. Use exactly as directed. Take your medicine at regular intervals. Do not take your medicine more often than directed. It is important that you put your used needles and syringes in a special sharps container. Do not put them in a trash can. If you do not have a sharps container, call your pharmacist or healthcare provider to get one. A special MedGuide will be given to you by the pharmacist with each prescription and refill. Be sure to read this information carefully each time. Talk to your pediatrician regarding the use of this medicine in children. While this medicine may be used in children as young as 1 month of age for selected conditions, precautions do apply. Overdosage: If you  think you have taken too much of this medicine contact a poison control center or emergency room at once. NOTE: This medicine is only for you. Do not share this medicine with others. What if I miss a dose? If you miss a dose, take it as soon as you can. If it is almost time for your next dose, take only that dose. Do not take double or extra doses. What may interact with this medication? Do not take this medicine with any of the following medications: epoetin alfa This list may not describe all possible interactions. Give your health care provider a list of all the medicines, herbs, non-prescription drugs, or dietary supplements you use. Also tell them if you smoke, drink alcohol, or use illegal drugs. Some items may interact with your medicine. What should I watch for while using this medication? Your condition will be monitored carefully while you are receiving this medicine. You may need blood work done while you are taking this medicine. This medicine may cause a decrease in vitamin B6. You should make sure that you get enough vitamin B6 while you are taking this medicine. Discuss the foods you eat and the vitamins you take with your health care professional. What side effects may I notice from receiving this medication? Side effects that you should report to your doctor or health care professional as soon as possible: allergic reactions like skin rash, itching or hives, swelling of the face, lips, or tongue breathing problems changes in vision chest pain confusion, trouble speaking or understanding feeling faint or lightheaded, falls high blood pressure   muscle aches or pains pain, swelling, warmth in the leg rapid weight gain severe headaches sudden numbness or weakness of the face, arm or leg trouble walking, dizziness, loss of balance or coordination seizures (convulsions) swelling of the ankles, feet, hands unusually weak or tired Side effects that usually do not require medical  attention (report to your doctor or health care professional if they continue or are bothersome): diarrhea fever, chills (flu-like symptoms) headaches nausea, vomiting redness, stinging, or swelling at site where injected This list may not describe all possible side effects. Call your doctor for medical advice about side effects. You may report side effects to FDA at 1-800-FDA-1088. Where should I keep my medication? Keep out of the reach of children. Store in a refrigerator between 2 and 8 degrees C (36 and 46 degrees F). Do not freeze. Do not shake. Throw away any unused portion if using a single-dose vial. Throw away any unused medicine after the expiration date. NOTE: This sheet is a summary. It may not cover all possible information. If you have questions about this medicine, talk to your doctor, pharmacist, or health care provider.  2022 Elsevier/Gold Standard (2017-05-13 16:44:20)  

## 2020-12-07 NOTE — Progress Notes (Signed)
[facility] Follow up:    Gatha Mayer, MD [address] [address]   DIAGNOSIS: anemia/thrombocytopenia  SUMMARY OF ONCOLOGIC HISTORY:   [name] [name] is a 70 y.o. female with above-mentioned history of thrombocytopenia and anemia. Labs on 11/01/20 RBC 3.14, Hg 9.8, HCT 32.7, platelets 104. She is currently on Xarelto for secondary stroke prevention. She reports via telephone for follow-up.  She was in the hospital recently with Klebsiella sepsis and has finally improved and is feeling better.  Her IJ central line was removed and a femoral line was placed.  Unfortunately her entire family came down with COVID and therefore she could not come here today for her injection appointment for the Aranesp.   CURRENT THERAPY: Aranesp/to receive IV iron  INTERVAL HISTORY: [name] [name] 70 y.o. female returns for evaluation of her anemia.  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  She has continued on xarelto and has not had any further blood clots.  She does not currently have a PICC and they are working on getting that replaced so that she can resume her TPN.  She says that with her short gut syndrome she has diarrhea every time she eats or drinks things.  Her iron studies from June show a ferritin of 88 and a saturation of 8.  She also has thrombocytopenia, however she also has splenomegaly.   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 07/31/2017   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[place]) 07/31/2017   Degenerative joint disease 07/08/2016   PICC line infection 05/16/2015   H/O mesenteric infarction 11/17/2013   Splenomegaly    Hx of adenomatous colonic polyps [id number]   Chronic systolic heart failure ([place]) 12/25/2011   Osteoporosis 03/14/2010   Anemia of chronic disease 10/01/2009   GERD (gastroesophageal reflux disease) 11/19/2008   ALKALINE PHOSPHATASE, ELEVATED 10/12/2008   VITAMIN D DEFICIENCY 01/19/2008   TRANSAMINASES, SERUM, ELEVATED 01/19/2008   Bacterial overgrowth syndrome 11/19/2004   Acquired short bowel syndrome 11/20/2003    is allergic to penicillins.  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) 05/25/2018   Carotid stenosis    Carotid US (9/15):  R 40-59%; L 1-39% >> FU 1 year   Closed left subtrochanteric femur fracture ([place]) 12/31/2017   Congestive heart failure (CHF) ([facility])    EF 25-30%   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   Fracture of left clavicle    History of blood transfusion 2013   anemia   Hx of cardiovascular stress test    Myoview (9/15):  inf-apical  scar; no ischemia; EF 47% - low risk    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 12/12/2011   Osteoporosis    Pancytopenia 10/07/2011   Pathologic fracture of neck of femur ([facility])    Personal history of colonic polyps    Renal insufficiency    hx of yrs ago   Serratia marcescens infection - bactermia assoc w/ PICC 01/18/2015   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[facility])    Splenomegaly    By ultrasound   Stroke ([place]) 07/2020    Thrombophilia ([place])    Vitamin D deficiency     SURGICAL HISTORY: Past Surgical History:  Procedure Laterality Date   APPENDECTOMY  yrs ago   CHOLECYSTECTOMY  yrs ago   COLONOSCOPY  12/05/2005   internal hemorrhoids (for polyp surveillance)   COLONOSCOPY  04/26/2012   Procedure: COLONOSCOPY;  Surgeon: Gatha Mayer, MD;  Location: [facility];  Service: Endoscopy;  Laterality: N/A;   COLONOSCOPY WITH PROPOFOL N/A 03/18/2016   Procedure: COLONOSCOPY WITH PROPOFOL;  Surgeon: Gatha Mayer, MD;  Location: [facility];  Service: Endoscopy;  Laterality: N/A;   ESOPHAGOGASTRODUODENOSCOPY  01/22/2009   erosive esophagitis   HARDWARE REMOVAL Left 12/31/2017   Procedure: HARDWARE REMOVAL;  Surgeon: Rod Can, MD;  Location: [facility];  Service: Orthopedics;  Laterality: Left;   INTRAMEDULLARY (IM) NAIL INTERTROCHANTERIC Left 12/31/2017   Procedure: INTRAMEDULLARY (IM) NAIL SUBTROCHANTRIC;  Surgeon: Rod Can, MD;  Location: [facility];  Service: Orthopedics;  Laterality: Left;   IR CV LINE INJECTION  03/23/2018   IR FLUORO GUIDE CV LINE LEFT  01/01/2018   IR FLUORO GUIDE CV LINE LEFT  03/24/2018   IR FLUORO GUIDE CV LINE LEFT  05/21/2018   IR FLUORO GUIDE CV LINE LEFT  07/09/2018   IR FLUORO GUIDE CV LINE RIGHT  11/01/2020   IR GENERIC HISTORICAL  07/04/2016   IR REMOVAL TUN CV CATH W/O FL 07/04/2016 Ascencion Dike, PA-C [facility]   IR GENERIC HISTORICAL  07/10/2016   IR US GUIDE VASC ACCESS LEFT 07/10/2016 Arne Cleveland, MD [facility]   IR GENERIC HISTORICAL  07/10/2016   IR FLUORO GUIDE CV LINE LEFT 07/10/2016 Arne Cleveland, MD [facility]   IR PATIENT EVAL TECH 0-60 MINS  10/07/2017   IR PATIENT EVAL TECH 0-60 MINS  03/09/2018   IR RADIOLOGIST EVAL & MGMT  07/21/2017   IR RADIOLOGIST EVAL & MGMT  11/14/2020   IR RADIOLOGIST EVAL & MGMT  11/27/2020   IR US GUIDE VASC ACCESS LEFT  11/01/2020   IR US GUIDE VASC ACCESS RIGHT  11/01/2020   IR VENO/EXT/UNI LEFT  11/01/2020   LEFT HEART CATH AND  CORONARY ANGIOGRAPHY N/A 06/26/2020   Procedure: LEFT HEART CATH AND CORONARY ANGIOGRAPHY;  Surgeon: Jolaine Artist, MD;  Location: [facility];  Service: Cardiovascular;  Laterality: N/A;   ORIF PROXIMAL FEMORAL FRACTURE W/ ITST NAIL SYSTEM  03/2007   left, Dr. Shellia Carwin   SMALL INTESTINE SURGERY  2005   multiple with right colon resection for ischemia/infarct    SOCIAL HISTORY: Social History   Socioeconomic History   Marital status: Married    Spouse name: Not on file   Number of children: Not on file   Years of education: Not on file   Highest education level: Not on file  Occupational History   Not on file  Tobacco Use   Smoking status: Former    Types: Cigarettes  Quit date: 05/12/2004    Years since quitting: 16.5   Smokeless tobacco: Never  Vaping Use   Vaping Use: Never used  Substance and Sexual Activity   Alcohol use: No   Drug use: No   Sexual activity: Yes    Comment: 1st intercourse 11 yo-5 partners  Other Topics Concern   Not on file  Social History Narrative   Married to Herbie [place], has 1 daughter and a granddaughter the patient's son died in childhood due to a motor vehicle wreck   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   No alcohol tobacco or drug use   04/07/2017   Social Determinants of Health   Financial Resource Strain: Not on file  Food Insecurity: Not on file  Transportation Needs: No Transportation Needs   Lack of Transportation (Medical): No   Lack of Transportation (Non-Medical): No  Physical Activity: Not on file  Stress: Not on file  Social Connections: Not on file  Intimate Partner Violence: Not on file    FAMILY HISTORY: Family History  Problem Relation Age of Onset   Diabetes Mother    Hypertension Mother    AAA (abdominal aortic aneurysm) Mother    Colon cancer Neg Hx    Stomach cancer Neg Hx     Review of Systems  Constitutional:  Positive for fatigue. Negative for appetite  change, chills, fever and unexpected weight change.  HENT:   Negative for hearing loss, lump/mass and trouble swallowing.   Eyes:  Negative for eye problems and icterus.  Respiratory:  Negative for chest tightness, cough and shortness of breath.   Cardiovascular:  Negative for chest pain, leg swelling and palpitations.  Gastrointestinal:  Positive for diarrhea. Negative for abdominal distention, abdominal pain, constipation, nausea and vomiting.  Endocrine: Negative for hot flashes.  Genitourinary:  Negative for difficulty urinating.   Musculoskeletal:  Negative for arthralgias.  Skin:  Negative for itching and rash.  Neurological:  Negative for dizziness, extremity weakness, headaches and numbness.  Hematological:  Negative for adenopathy. Does not bruise/bleed easily.  Psychiatric/Behavioral:  Negative for depression. The patient is not nervous/anxious.      PHYSICAL EXAMINATION  ECOG PERFORMANCE STATUS: 1 - Symptomatic but completely ambulatory  Vitals:   12/07/20 1228  BP: (!) 92/58  Pulse: 71  Resp: 18  Temp: 97.8 F (36.6 C)  SpO2: 100%    Physical Exam Constitutional:      General: She is not in acute distress.    Appearance: Normal appearance. She is not toxic-appearing.  HENT:     Head: Normocephalic and atraumatic.  Eyes:     General: No scleral icterus. Cardiovascular:     Rate and Rhythm: Normal rate and regular rhythm.     Pulses: Normal pulses.     Heart sounds: Normal heart sounds.  Pulmonary:     Effort: Pulmonary effort is normal.     Breath sounds: Normal breath sounds.  Abdominal:     General: Abdomen is flat. Bowel sounds are normal. There is no distension.     Palpations: Abdomen is soft.     Tenderness: There is no abdominal tenderness.  Musculoskeletal:        General: No swelling.     Cervical back: Neck supple.  Lymphadenopathy:     Cervical: No cervical adenopathy.  Skin:    General: Skin is warm and dry.     Findings: No rash.   Neurological:     General: No  focal deficit present.     Mental Status: She is alert.  Psychiatric:        Mood and Affect: Mood normal.        Behavior: Behavior normal.    LABORATORY DATA:  CBC    Component Value Date/Time   WBC 6.4 12/07/2020 1153   WBC 6.7 11/01/2020 0409   RBC 2.93 (L) 12/07/2020 1153   HGB 8.9 (L) 12/07/2020 1153   HGB 9.9 (L) 05/02/2014 1200   HCT 27.0 (L) 12/07/2020 1153   HCT 30.8 (L) 05/02/2014 1200   PLT 93 (L) 12/07/2020 1153   PLT 102 (L) 05/02/2014 1200   PLT 166 05/07/2010 0000   MCV 92.2 12/07/2020 1153   MCV 96.3 05/02/2014 1200   MCH 30.4 12/07/2020 1153   MCHC 33.0 12/07/2020 1153   RDW 14.9 12/07/2020 1153   RDW 14.2 05/02/2014 1200   LYMPHSABS 2.4 12/07/2020 1153   LYMPHSABS 1.9 05/02/2014 1200   MONOABS 0.6 12/07/2020 1153   MONOABS 0.5 05/02/2014 1200   EOSABS 0.6 (H) 12/07/2020 1153   EOSABS 0.2 05/02/2014 1200   EOSABS 0.4 03/05/2010 0857   BASOSABS 0.1 12/07/2020 1153   BASOSABS 0.0 05/02/2014 1200    CMP     Component Value Date/Time   NA 139 11/01/2020 0409   NA 139 01/13/2014 0951   K 3.1 (L) 11/01/2020 0409   K 3.6 01/13/2014 0951   CL 119 (H) 11/01/2020 0409   CL 106 10/22/2012 1005   CO2 17 (L) 11/01/2020 0409   CO2 21 (L) 01/13/2014 0951   GLUCOSE 75 11/01/2020 0409   GLUCOSE 95 01/13/2014 0951   GLUCOSE 95 10/22/2012 1005   BUN 7 (L) 11/01/2020 0409   BUN 14.6 01/13/2014 0951   CREATININE 1.23 (H) 11/01/2020 0409   CREATININE 1.0 01/13/2014 0951   CALCIUM 8.0 (L) 11/01/2020 0409   CALCIUM 8.9 01/13/2014 0951   PROT 6.4 (L) 10/29/2020 0447   PROT 8.1 01/13/2014 0951   ALBUMIN 2.5 (L) 10/29/2020 0447   ALBUMIN 3.4 (L) 01/13/2014 0951   AST 18 10/29/2020 0447   AST 19 01/13/2014 0951   ALT 14 10/29/2020 0447   ALT 20 01/13/2014 0951   ALKPHOS 87 10/29/2020 0447   ALKPHOS 85 01/13/2014 0951   BILITOT 0.5 10/29/2020 0447   BILITOT 0.26 01/13/2014 0951   GFRNONAA 47 (L) 11/01/2020 0409   GFRAA 59  (L) 12/08/2018 2031       ASSESSMENT and THERAPY PLAN:   Anemia of chronic disease Normochromic normocytic anemia due 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 She received last Aranesp injection February 2016. She had required a total of 3 Aranesp injections in 2015, while she received at least 10 injections in 2014. So we discontinued therapy.   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 This is the reason for her chronic disease.   Thrombocytopenia ([place]) Patient's baseline platelet count is around 100 (has spenomegaly)  Iron deficiency: her iron studies show saturation of less than 10 and ferritin of 88, per guidelines for Aranesp she needs IV iron.  I spoke with Dr. Alvy Bimler and reviewed the above with ehr.  She will receive Injectafer.  I placed these orders.    Continue with Aranesp injections and  monitoring of platelets.  She will return in 1-2 weeks for IV iron, lab and f/u with Dr. Lindi Adie.     Orders Placed This Encounter  Procedures   CBC with Differential ([place] Only)    Standing Status:   Standing    Number of Occurrences:   52    Standing Expiration Date:   12/07/2021   CMP ([place] only)    Standing Status:   Standing    Number of Occurrences:   52    Standing Expiration Date:   12/07/2021   Ferritin    Standing Status:   Standing    Number of Occurrences:   52    Standing Expiration Date:   12/07/2021   Iron and TIBC    Standing Status:   Standing    Number of Occurrences:   52    Standing Expiration Date:   12/07/2021    All questions were answered. The patient knows to call the clinic with any problems, questions or concerns. We can certainly see the patient much sooner if necessary.  Total encounter time: 30 minutes  in face to face visit time, lab review, chart review, and documentation of the encounter.   Wilber Bihari, NP 12/09/20 12:00 PM Medical Oncology and Hematology [facility] [place], [place] [address] Tel. [phone]    Fax. [phone]  *Total Encounter Time as defined by the Centers for Medicare and Medicaid Services includes, in addition to the face-to-face time of a patient visit (documented in the note above) non-face-to-face time: obtaining and reviewing outside history, ordering and reviewing medications, tests or procedures, care coordination (communications with other health care professionals or caregivers) and documentation in the medical record.

## 2020-12-09 ENCOUNTER — Encounter: Payer: Self-pay | Admitting: Hematology and Oncology

## 2020-12-09 NOTE — Assessment & Plan Note (Addendum)
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 She received last Aranesp injection February 2016. She had required a total of 3 Aranesp injections in 2015, while she received at least 10 injections in 2014. So we discontinued therapy.  Current treatment:  Reinitiated Aranesp 10/26/2020  Short bowel syndromeon Gattex, Teduglutide she is currently on TPN 3 nights a week She had surgery on her small intestine for blood clots in 2006 and since then she has short gut syndrome. This is the reason for her chronic disease.  Thrombocytopenia ([place]) Patient's baseline platelet count is around 100 (has spenomegaly)  Iron deficiency: her iron studies show saturation of less than 10 and ferritin of 88, per guidelines for Aranesp she needs IV iron.  I spoke with Dr. Alvy Bimler and reviewed the above with ehr.  She will receive Injectafer.  I placed these orders.    Continue with Aranesp injections and monitoring of platelets.  She will return in 1-2 weeks for IV iron, lab and f/u with Dr. Lindi Adie.

## 2020-12-10 ENCOUNTER — Telehealth: Payer: Self-pay | Admitting: Hematology and Oncology

## 2020-12-10 ENCOUNTER — Telehealth: Payer: Self-pay

## 2020-12-10 NOTE — Chronic Care Management (AMB) (Signed)
Chronic Care Management Pharmacy Assistant   Name: [name] [name]  MRN: [id number] DOB: 05-29-1950   Reason for Encounter: Disease State - General Adherence Call (Reschedule No show appointment from 10/17/20 with CPP)    Recent office visits:  10/15/20 Annye Asa, MD (PCP) - Family Medicine - Tick Bite - Cephalexin 500 mg 1 tablet three times daily prescribed. Triamcinolone Acetonide 0.1% apply three times daily topically prescribed. Tetanus shot updated. Follow up not indicated.   Recent consult visits:  12/07/20 Wilber Bihari, NP - Oncology - Anemia of Chronic Disease - Continue Aranesp injections and platelet monitoring. Labs were ordered.  Follow up  in 1-2 weeks for IV iron, lab and f/u with Dr. Lindi Adie.   11/16/20 - Chanetta Marshall - Oncology - Thrombocytopenia - Labs were ordered. Due to recent COVID infection, we will have to wait 3 weeks for her to receive a Aranesp injection. No medication changes noted. Follow up every 3 weeks for Aranesp injection.   10/22/20 Vinay Gudena,MD - oncology - Anemia of Chronic Disease - Labs were ordered. Reinitiated therapy with Aranesp.  New order placed and she will start this 10/26/20. Follow up in 1 month to review labs.   09/25/20 Servando Snare, MD - Vascula Surgery - Subclavian Vein Obstruction with Collaterals - No medication changes. No interventions needed at this time. Follow up yearly.   09/21/20 Silvano Rusk, MD - Gastroenterology - Acquired Short Bowel Syndrome - [place] of Abdomen ordered and scheduled for 09/26/20. Follow up not indicated.   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 Baclofen 10 mg 1 tablet twice daily PRN started. Follow up in 3 months.     Hospital visits: 10/27/20 - 6-23/22  Medication Reconciliation was completed by comparing discharge summary, patient's EMR and Pharmacy list, and upon  discussion with patient.  Admitted to the hospital on 10/27/20 due to Acquired Short Bowel Syndrome. Discharge date was 11/01/20. Discharged from [place]?Medications Started at [facility] Discharge:?? -Keflex 500 mg every 6 hours for 7 days from 11/02/20.  Medication Changes at Hospital Discharge: -Changed Xarelto 20 mg one daily with supper.  Medications Discontinued at Hospital Discharge: -No medications were discontinued.   Medications that remain the same after Hospital Discharge:??  -All other medications will remain the same.    Medication Reconciliation was completed by comparing discharge summary, patient's EMR and Pharmacy list, and upon discussion with patient.   Hospital visits:  07/23/20 - 07/26/20  Admitted to the hospital on 07/23/20  due to Cerebrovascular Accident (CVA). Discharge date was 07/26/20. Discharged from [place]?Medications Started at [facility] Discharge:?? Started HEPARIN LOCK FLUSH IJ Inject 5 mLs as directed 4 (four) times a week. Monday, Tuesday, Wednesday, Thursday in the morning with TPN.  Medication Changes at Hospital Discharge: -Changed Clobetasol ointment 0.05% apply topically twice daily.  -Changed Fluticasone 40mg/ACT nasal spray 2 sprays each nostril every day. -Changed Furosemide 20 mg take 1 tablet as needed for swelling.  -Changed Omeprazole 40 mg 1 capsule twice daily before breakfast and supper. -Changed Teduglutide 5 mg Inject 3.3 units into skin daily.  -Changed Xarelto 20 mg by mouth daily with supper.   Medications Discontinued at Hospital Discharge: -No medications were discontinued.   Medications that remain the same after Hospital Discharge:??  -All other medications will remain the same.  Medications: Outpatient Encounter Medications as of 12/10/2020  Medication Sig Note   acetaminophen (TYLENOL) 500 MG tablet Take 1,000 mg by mouth daily as needed for mild pain.    ADULT TPN Inject  1,800 mLs into the vein 4 (four) times a week. Pt receives home TPN from [facility]:  1800 mL bag, four nights weekly (Monday, Tuesday, Wednesday, Thursday for 8 hours (includes 1 hour taper up and down). 10/27/2020: [facility] infusion provides.   baclofen (LIORESAL) 10 MG tablet Take 1 tablet (10 mg total) by mouth 2 (two) times daily as needed for muscle spasms.    Calcium Carb-Cholecalciferol (CALCIUM + D3 PO) Take 2 tablets by mouth daily.    cetirizine (ZYRTEC) 10 MG tablet Take 10 mg by mouth at bedtime as needed for allergies.    diphenhydrAMINE (BENADRYL) 25 mg capsule Take 25-50 mg by mouth every 6 (six) hours as needed for itching or sleep.     fluticasone (FLONASE) 50 MCG/ACT nasal spray Place 2 sprays into both nostrils daily as needed for allergies or rhinitis.    furosemide (LASIX) 20 MG tablet TAKE ONE TABLET BY MOUTH AS NEEDED FOR SWELLING    Heparin Sodium, Porcine, (HEPARIN LOCK FLUSH IJ) Inject 5 mLs as directed 4 (four) times a week. Monday, Tuesday, Wednesday, Thursday in the morning with TPN    Multiple Vitamins-Minerals (ADULT GUMMY PO) Take 2 tablets by mouth daily.    omeprazole (PRILOSEC) 40 MG capsule Take 40 mg by mouth daily.    ondansetron (ZOFRAN-ODT) 8 MG disintegrating tablet TAKE 1 TABLET (8 MG TOTAL) BY MOUTH EVERY 8 (EIGHT) HOURS AS NEEDED FOR NAUSEA OR VOMITING.    sacubitril-valsartan (ENTRESTO) 24-26 MG Take 1 tablet by mouth 2 (two) times daily.    sodium chloride 0.9 % infusion Inject 900 mLs into the vein every 14 (fourteen) days. Uses for TPN    spironolactone (ALDACTONE) 25 MG tablet Take 1 tablet (25 mg total) by mouth daily.    Teduglutide, rDNA, 5 MG KIT Inject 3.1 application into the skin every morning. 10/27/2020: Pt will provide from home   triamcinolone ointment (KENALOG) 0.1 % Apply 1 application topically 2 (two) times daily.    vitamin E 1000 UNIT capsule Take 1,000 Units by mouth daily.    XARELTO 20 MG TABS tablet TAKE 1 TABLET (20 MG TOTAL) BY  MOUTH DAILY WITH SUPPER.    No facility-administered encounter medications on file as of 12/10/2020.    Have you had any problems recently with your health? Patient denies any new problems with her health currently. She reports she is scheduled for a iron infusion on 12/19/20. She states she does plan to call Interventional Radiology about her PICC line which is now in her groin. She states its not leaking and is working fine but she can see more of the line that she had before.   Have you had any problems with your pharmacy? Patient denied having any issues with her current pharmacy.   What issues or side effects are you having with your medications? Patient denied having any issues or side effects with her current medications.   What would you like me to pass along to Madelin Rear, CPP for them to help you with?  Patient did not have anything to currently update other than she was doing better and is starting iron infusions for her Anemia 12/19/20.  What can we do to take care of you better? Patient did not have any recommendations at this time  and states she is pleased with her current level of care.    Star Rating Drugs: No Star Rating Drugs noted.   I spoke to patient and rescheduled her no show Pharmacist visit from 10/17/20 to 02/07/21 @ 3:00 pm. Patient requested telephone visit as she has several appointments and procedures coming up and was unsure of her schedule next month. Patient verbalized understanding of appointment date and time and will contact office to reschedule if necessary.    Jobe Gibbon, [place] Clinical Pharmacist Assistant  [phone]  Time Spent: 55 minutes

## 2020-12-10 NOTE — Telephone Encounter (Signed)
Scheduled appointment per 07/29 los. Patient is aware.

## 2020-12-14 ENCOUNTER — Other Ambulatory Visit: Payer: Medicare Other

## 2020-12-14 DIAGNOSIS — K912 Postsurgical malabsorption, not elsewhere classified: Secondary | ICD-10-CM

## 2020-12-17 ENCOUNTER — Emergency Department (HOSPITAL_COMMUNITY)
Admission: EM | Admit: 2020-12-17 | Discharge: 2020-12-17 | Disposition: A | Discharge status: Home / Self Care | Payer: Medicare Other | Attending: Emergency Medicine | Admitting: Emergency Medicine

## 2020-12-17 ENCOUNTER — Emergency Department (HOSPITAL_COMMUNITY): Payer: Medicare Other

## 2020-12-17 ENCOUNTER — Other Ambulatory Visit: Payer: Self-pay

## 2020-12-17 ENCOUNTER — Emergency Department: Payer: Self-pay

## 2020-12-17 DIAGNOSIS — T82898A Other specified complication of vascular prosthetic devices, implants and grafts, initial encounter: Secondary | ICD-10-CM | POA: Insufficient documentation

## 2020-12-17 DIAGNOSIS — M1611 Unilateral primary osteoarthritis, right hip: Secondary | ICD-10-CM | POA: Diagnosis not present

## 2020-12-17 DIAGNOSIS — Z7901 Long term (current) use of anticoagulants: Secondary | ICD-10-CM | POA: Diagnosis not present

## 2020-12-17 DIAGNOSIS — T829XXA Unspecified complication of cardiac and vascular prosthetic device, implant and graft, initial encounter: Secondary | ICD-10-CM

## 2020-12-17 DIAGNOSIS — I509 Heart failure, unspecified: Secondary | ICD-10-CM | POA: Insufficient documentation

## 2020-12-17 DIAGNOSIS — T82594A Other mechanical complication of infusion catheter, initial encounter: Secondary | ICD-10-CM | POA: Diagnosis not present

## 2020-12-17 DIAGNOSIS — Z452 Encounter for adjustment and management of vascular access device: Secondary | ICD-10-CM | POA: Diagnosis not present

## 2020-12-17 DIAGNOSIS — Z87891 Personal history of nicotine dependence: Secondary | ICD-10-CM | POA: Diagnosis not present

## 2020-12-17 MED ORDER — HEPARIN SOD (PORK) LOCK FLUSH 100 UNIT/ML IV SOLN
250.0000 [IU] | INTRAVENOUS | Status: AC | PRN
  Administered 2020-12-17: 250 [IU]

## 2020-12-17 NOTE — ED Triage Notes (Signed)
Pt was told to come in through ED to have her PICC line replaced in right groin. The PICC line had become displaced 2 nights ago. She uses it for TPN 4 nights a week.

## 2020-12-17 NOTE — ED Notes (Signed)
Attempted to contact IR with no answer.

## 2020-12-17 NOTE — Progress Notes (Signed)
VAST RN contacted to review hip x-ray for PICC tip placement. PICC tip is noted to be at level of L3.  PICC RN verified since it is not in the IVC it CANNOT be used for TPN infusion.  VAST RN notified ER charge nurse.  VAST RN to come flush line with NS and heparin for patient's discharge.

## 2020-12-17 NOTE — ED Provider Notes (Signed)
[facility] DEPT Provider Note   CSN: [id number] Arrival date & time: 12/17/20  1047     History Chief Complaint  Patient presents with   Vascular Access Problem    [name] [name] is a 70 y.o. female.  [name] presents with a complication of her PICC line.  She has had PICC lines for years secondary to her short gut syndrome.  She infuses TPN Monday through Thursday, and she last used her PICC catheter 3 days ago.  She states that somehow, the catheter was displaced, and now a significant amount of it is retracted.  She has not tried to use it, and when she called her provider, she was directed to the emergency department.  She reports no pain or acute symptoms.  The history is provided by the patient.      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   Fracture of left clavicle    History of blood transfusion 2013   anemia   Hx of cardiovascular stress test    Myoview (9/15):  inf-apical scar; no ischemia; EF 47% - low risk    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 12/12/2011   Osteoporosis    Pancytopenia 10/07/2011   Pathologic fracture of neck of femur ([place])    Personal history of colonic polyps    Renal insufficiency    hx of yrs ago   Serratia marcescens infection - bactermia assoc w/ PICC 01/18/2015   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HCC)    Splenomegaly    By ultrasound    Stroke ([place]) 07/2020   Thrombophilia ([place])    Vitamin D deficiency     Patient Active Problem List   Diagnosis Date Noted   Carotid artery stenosis 10/31/2020   Fever 10/29/2020   Bacteremia 10/27/2020   Macrocytic anemia 10/27/2020   NAFLD (nonalcoholic fatty liver disease) 09/21/2020   Stroke (cerebrum) ([place]) 07/25/2020   History of CVA (cerebrovascular accident) 07/24/2020   Thrombocytopenia ([place]) 07/24/2020   Chronic renal failure, stage 3a ([place]) 07/24/2020   Pressure injury of skin 07/24/2020   Stroke ([place]) 07/23/2020   Carnitine deficiency ([place]) 05/25/2018   Hypotension 12/30/2017   Bradycardia 12/30/2017   Deep venous thrombosis ([place]) left subclavian vein 07/31/2017   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[place]) 07/31/2017   Degenerative joint disease 07/08/2016   PICC line infection 05/16/2015   H/O mesenteric infarction 11/17/2013   Splenomegaly    Hx of adenomatous colonic polyps 32/44/0102   Chronic systolic heart failure ([place]) 12/25/2011   Osteoporosis 03/14/2010   Anemia of chronic disease 10/01/2009   GERD (gastroesophageal reflux disease) 11/19/2008   ALKALINE PHOSPHATASE, ELEVATED 10/12/2008   VITAMIN D DEFICIENCY 01/19/2008   TRANSAMINASES, SERUM, ELEVATED 01/19/2008   Bacterial overgrowth syndrome 11/19/2004   Acquired short bowel syndrome 11/20/2003    Past  Surgical History:  Procedure Laterality Date   APPENDECTOMY  yrs ago   CHOLECYSTECTOMY  yrs ago   COLONOSCOPY  12/05/2005   internal hemorrhoids (for polyp surveillance)   COLONOSCOPY  04/26/2012   Procedure: COLONOSCOPY;  Surgeon: Gatha Mayer, MD;  Location: [facility];  Service: Endoscopy;  Laterality: N/A;   COLONOSCOPY WITH PROPOFOL N/A 03/18/2016   Procedure: COLONOSCOPY WITH PROPOFOL;  Surgeon: Gatha Mayer, MD;  Location: [facility];  Service: Endoscopy;  Laterality: N/A;   ESOPHAGOGASTRODUODENOSCOPY  01/22/2009   erosive esophagitis   HARDWARE REMOVAL Left 12/31/2017   Procedure: HARDWARE  REMOVAL;  Surgeon: Rod Can, MD;  Location: [facility];  Service: Orthopedics;  Laterality: Left;   INTRAMEDULLARY (IM) NAIL INTERTROCHANTERIC Left 12/31/2017   Procedure: INTRAMEDULLARY (IM) NAIL SUBTROCHANTRIC;  Surgeon: Rod Can, MD;  Location: [facility];  Service: Orthopedics;  Laterality: Left;   IR CV LINE INJECTION  03/23/2018   IR FLUORO GUIDE CV LINE LEFT  01/01/2018   IR FLUORO GUIDE CV LINE LEFT  03/24/2018   IR FLUORO GUIDE CV LINE LEFT  05/21/2018   IR FLUORO GUIDE CV LINE LEFT  07/09/2018   IR FLUORO GUIDE CV LINE RIGHT  11/01/2020   IR GENERIC HISTORICAL  07/04/2016   IR REMOVAL TUN CV CATH W/O FL 07/04/2016 Ascencion Dike, PA-C [facility]   IR GENERIC HISTORICAL  07/10/2016   IR US GUIDE VASC ACCESS LEFT 07/10/2016 Arne Cleveland, MD [facility]   IR GENERIC HISTORICAL  07/10/2016   IR FLUORO GUIDE CV LINE LEFT 07/10/2016 Arne Cleveland, MD [facility]   IR PATIENT EVAL TECH 0-60 MINS  10/07/2017   IR PATIENT EVAL TECH 0-60 MINS  03/09/2018   IR RADIOLOGIST EVAL & MGMT  07/21/2017   IR RADIOLOGIST EVAL & MGMT  11/14/2020   IR RADIOLOGIST EVAL & MGMT  11/27/2020   IR US GUIDE VASC ACCESS LEFT  11/01/2020   IR US GUIDE VASC ACCESS RIGHT  11/01/2020   IR VENO/EXT/UNI LEFT  11/01/2020   LEFT HEART CATH AND CORONARY ANGIOGRAPHY N/A 06/26/2020   Procedure: LEFT HEART CATH AND CORONARY ANGIOGRAPHY;  Surgeon: Jolaine Artist, MD;  Location: [facility] CV LAB;  Service: Cardiovascular;  Laterality: N/A;   ORIF PROXIMAL FEMORAL FRACTURE W/ ITST NAIL SYSTEM  03/2007   left, Dr. Shellia Carwin   SMALL INTESTINE SURGERY  2005   multiple with right colon resection for ischemia/infarct     OB History     Gravida  2   Para  2   Term      Preterm      AB      Living  1      SAB      IAB      Ectopic      Multiple      Live Births              Family History  Problem Relation Age of Onset   Diabetes Mother    Hypertension Mother    AAA (abdominal aortic aneurysm)  Mother    Colon cancer Neg Hx    Stomach cancer Neg Hx     Social History   Tobacco Use   Smoking status: Former    Types: Cigarettes    Quit date: 05/12/2004    Years since quitting: 16.6   Smokeless tobacco: Never  Vaping Use   Vaping Use: Never used  Substance Use Topics   Alcohol use: No   Drug  use: No    Home Medications Prior to Admission medications   Medication Sig Start Date End Date Taking? Authorizing Provider  acetaminophen (TYLENOL) 500 MG tablet Take 1,000 mg by mouth daily as needed for mild pain.    [provider]  ADULT TPN Inject 1,800 mLs into the vein 4 (four) times a week. Pt receives home TPN from [facility]:  1800 mL bag, four nights weekly (Monday, Tuesday, Wednesday, Thursday for 8 hours (includes 1 hour taper up and down).    [provider]  baclofen (LIORESAL) 10 MG tablet Take 1 tablet (10 mg total) by mouth 2 (two) times daily as needed for muscle spasms. 08/29/20   Frann Rider, NP  Calcium Carb-Cholecalciferol (CALCIUM + D3 PO) Take 2 tablets by mouth daily.    [provider]  cetirizine (ZYRTEC) 10 MG tablet Take 10 mg by mouth at bedtime as needed for allergies.    [provider]  diphenhydrAMINE (BENADRYL) 25 mg capsule Take 25-50 mg by mouth every 6 (six) hours as needed for itching or sleep.     [provider]  fluticasone (FLONASE) 50 MCG/ACT nasal spray Place 2 sprays into both nostrils daily as needed for allergies or rhinitis.    [provider]  furosemide (LASIX) 20 MG tablet TAKE ONE TABLET BY MOUTH AS NEEDED FOR SWELLING 08/01/20   Bensimhon, Shaune Pascal, MD  Heparin Sodium, Porcine, (HEPARIN LOCK FLUSH IJ) Inject 5 mLs as directed 4 (four) times a week. Monday, Tuesday, Wednesday, Thursday in the morning with TPN    [provider]  Multiple Vitamins-Minerals (ADULT GUMMY PO) Take 2 tablets by mouth daily.    [provider]  omeprazole (PRILOSEC) 40 MG capsule Take 40  mg by mouth daily.    [provider]  ondansetron (ZOFRAN-ODT) 8 MG disintegrating tablet TAKE 1 TABLET (8 MG TOTAL) BY MOUTH EVERY 8 (EIGHT) HOURS AS NEEDED FOR NAUSEA OR VOMITING. 11/21/20   Gatha Mayer, MD  sacubitril-valsartan (ENTRESTO) 24-26 MG Take 1 tablet by mouth 2 (two) times daily. 05/09/20   Josue Hector, MD  sodium chloride 0.9 % infusion Inject 900 mLs into the vein every 14 (fourteen) days. Uses for TPN 10/27/17   [provider]  spironolactone (ALDACTONE) 25 MG tablet Take 1 tablet (25 mg total) by mouth daily. 07/04/20   Bensimhon, Shaune Pascal, MD  Teduglutide, rDNA, 5 MG KIT Inject 3.1 application into the skin every morning.    [provider]  triamcinolone ointment (KENALOG) 0.1 % Apply 1 application topically 2 (two) times daily. 10/15/20 10/15/21  [name], MD  vitamin E 1000 UNIT capsule Take 1,000 Units by mouth daily.    [provider]  XARELTO 20 MG TABS tablet TAKE 1 TABLET (20 MG TOTAL) BY MOUTH DAILY WITH SUPPER. 11/13/20   [name], MD    Allergies    Penicillins  Review of Systems   Review of Systems  Constitutional:  Negative for chills and fever.  HENT:  Negative for ear pain and sore throat.   Eyes:  Negative for pain and visual disturbance.  Respiratory:  Negative for cough and shortness of breath.   Cardiovascular:  Negative for chest pain and palpitations.  Gastrointestinal:  Negative for abdominal pain and vomiting.  Genitourinary:  Negative for dysuria and hematuria.  Musculoskeletal:  Negative for arthralgias and back pain.  Skin:  Negative for color change and rash.  Neurological:  Negative for seizures and  syncope.  All other systems reviewed and are negative.  Physical Exam Updated Vital Signs BP 106/73   Pulse 70   Temp 97.8 F (36.6 C) (Oral)   Resp 18   LMP 02/26/2014   SpO2 96%   Physical Exam Vitals and nursing note reviewed.  HENT:     Head: Normocephalic and atraumatic.   Eyes:     General: No scleral icterus. Pulmonary:     Effort: Pulmonary effort is normal. No respiratory distress.  Musculoskeletal:     Cervical back: Normal range of motion.  Skin:    General: Skin is warm and dry.     Comments: PICC line in place at the proximal right thigh.  The 10 mark is visible just distal to the entry point.  No skin erythema.  No discharge.  No fluctuance, induration, or palpable venous cords.  Neurological:     General: No focal deficit present.     Mental Status: She is alert and oriented to person, place, and time.  Psychiatric:        Mood and Affect: Mood normal.    ED Results / Procedures / Treatments   Labs (all labs ordered are listed, but only abnormal results are displayed) Labs Reviewed - No data to display  EKG None  Radiology DG Hip Unilat W or Wo Pelvis 2-3 Views Right  Result Date: 12/17/2020 CLINICAL DATA:  PICC placement EXAM: DG HIP (WITH OR WITHOUT PELVIS) 2-3V RIGHT COMPARISON:  None. FINDINGS: There is a right femoral approach angio catheter with tip at the level of L3. Prior left proximal femur fixation. Mild bilateral hip degenerative changes. No acute osseous abnormality. Heterotopic ossification near the iliac crest. IMPRESSION: Right femoral approach PICC with tip at the level of L3. Electronically Signed   By: Maurine Simmering   On: 12/17/2020 14:21   [place] EKG SITE RITE  Result Date: 12/17/2020 If Site Rite image not attached, placement could not be confirmed due to current cardiac rhythm.   Procedures Procedures   Medications Ordered in ED Medications - No data to display  ED Course  I have reviewed the triage vital signs and the nursing notes.  Pertinent labs & imaging results that were available during my care of the patient were reviewed by me and considered in my medical decision making (see chart for details).  Clinical Course as of 12/17/20 1522  Mon Dec 17, 2020  1326 I spoke to IR.  Patient cannot be accommodated  today.  They recommended x-ray to evaluate whether or not this was in a vein.  Patient can be scheduled tomorrow. [AW]    Clinical Course User Index [AW] Arnaldo Natal, MD   MDM Rules/Calculators/A&P                           Jake C Goldie presented with displacement of a large section of her PICC line.  The PICC line is still in a vein, and it can likely be used without complication.  She was able to be scheduled for replacement tomorrow.  She will be discharged home. Final Clinical Impression(s) / ED Diagnoses Final diagnoses:  Complication associated with peripherally inserted central catheter (PICC), initial encounter    Rx / DC Orders ED Discharge Orders     None        Arnaldo Natal, MD 12/17/20 1524

## 2020-12-17 NOTE — ED Notes (Signed)
Spoke to IR and will call this RN back. May not be able to get patient until this evening.

## 2020-12-17 NOTE — Progress Notes (Signed)
VAST consulted to assess right groin PICC. Per patient, she has had PICCs in various locations the last 14 years for at administration of TPN in home setting. She stated this PICC was placed about 3 weeks ago and was stitched in place. She removed the stitches herself when they started causing irritation. She reported she used Statlock the first couple of days, but that also started causing irritation. She then used Steristrips to secure line.  The line is currently covered with gauze and transparent dressing; the insertion site cannot be visualized, but the cuff is visible. Patient reports the line is about 5cms out from insertion site. X-ray ordered to confirm PICC tip placement.

## 2020-12-17 NOTE — ED Notes (Signed)
Spoke with IR. Waiting to here back to schedule patient for IR tomorrow. Patient may need to be seen at [facility].

## 2020-12-17 NOTE — ED Notes (Signed)
Per Tiffany in IR-her appointment is scheduled for 1 pm tomorrow

## 2020-12-17 NOTE — ED Notes (Signed)
Updated patient and family with regards to appointment time-IV team RN at bedside

## 2020-12-17 NOTE — Progress Notes (Signed)
VAST RN to bedside to assess and flush PICC for patient discharge. Patient is scheduled to return tomorrow for PICC exchange/replacement by IR at [facility].  Right groin PICC with good blood return and flushed easily with 25ms NS, followed by 2.5 mLs heparin flush as charted in MAR. Physician came in while VAST RN flushing line and instructed L3 level should be well within her IVC. Pt instructed to do what she feels comfortable doing in regards to TPN infusion tonight per MD.

## 2020-12-18 ENCOUNTER — Encounter (HOSPITAL_COMMUNITY): Payer: Self-pay | Admitting: Internal Medicine

## 2020-12-18 ENCOUNTER — Ambulatory Visit (HOSPITAL_COMMUNITY)
Admission: RE | Admit: 2020-12-18 | Discharge: 2020-12-18 | Disposition: A | Discharge status: Home / Self Care | Payer: Medicare Other | Source: Ambulatory Visit | Attending: Internal Medicine | Admitting: Internal Medicine

## 2020-12-18 ENCOUNTER — Other Ambulatory Visit (HOSPITAL_COMMUNITY): Payer: Medicare Other

## 2020-12-18 ENCOUNTER — Other Ambulatory Visit (HOSPITAL_COMMUNITY): Payer: Self-pay | Admitting: Emergency Medicine

## 2020-12-18 ENCOUNTER — Ambulatory Visit (HOSPITAL_COMMUNITY)
Admission: RE | Admit: 2020-12-18 | Discharge: 2020-12-18 | Disposition: A | Discharge status: Home / Self Care | Payer: Medicare Other | Source: Ambulatory Visit | Attending: Emergency Medicine | Admitting: Emergency Medicine

## 2020-12-18 VITALS — BP 92/58 | HR 58 | Wt 121.2 lb

## 2020-12-18 DIAGNOSIS — Z8616 Personal history of COVID-19: Secondary | ICD-10-CM | POA: Insufficient documentation

## 2020-12-18 DIAGNOSIS — Z8249 Family history of ischemic heart disease and other diseases of the circulatory system: Secondary | ICD-10-CM | POA: Diagnosis not present

## 2020-12-18 DIAGNOSIS — K912 Postsurgical malabsorption, not elsewhere classified: Secondary | ICD-10-CM

## 2020-12-18 DIAGNOSIS — Z87891 Personal history of nicotine dependence: Secondary | ICD-10-CM | POA: Insufficient documentation

## 2020-12-18 DIAGNOSIS — T82598A Other mechanical complication of other cardiac and vascular devices and implants, initial encounter: Secondary | ICD-10-CM | POA: Diagnosis not present

## 2020-12-18 DIAGNOSIS — Z880 Allergy status to penicillin: Secondary | ICD-10-CM | POA: Insufficient documentation

## 2020-12-18 DIAGNOSIS — I5022 Chronic systolic (congestive) heart failure: Secondary | ICD-10-CM | POA: Diagnosis not present

## 2020-12-18 DIAGNOSIS — I2510 Atherosclerotic heart disease of native coronary artery without angina pectoris: Secondary | ICD-10-CM | POA: Diagnosis not present

## 2020-12-18 DIAGNOSIS — I350 Nonrheumatic aortic (valve) stenosis: Secondary | ICD-10-CM

## 2020-12-18 DIAGNOSIS — I447 Left bundle-branch block, unspecified: Secondary | ICD-10-CM | POA: Diagnosis not present

## 2020-12-18 DIAGNOSIS — Z8673 Personal history of transient ischemic attack (TIA), and cerebral infarction without residual deficits: Secondary | ICD-10-CM | POA: Insufficient documentation

## 2020-12-18 DIAGNOSIS — I429 Cardiomyopathy, unspecified: Secondary | ICD-10-CM | POA: Insufficient documentation

## 2020-12-18 MED ORDER — HEPARIN SOD (PORK) LOCK FLUSH 100 UNIT/ML IV SOLN
INTRAVENOUS | Status: AC
  Filled 2020-12-18: qty 5

## 2020-12-18 MED ORDER — LIDOCAINE HCL 1 % IJ SOLN
INTRAMUSCULAR | Status: AC | PRN
Start: 2020-12-18 — End: 2020-12-18
  Administered 2020-12-18: 5 mL via INTRADERMAL

## 2020-12-18 MED ORDER — SPIRONOLACTONE 25 MG PO TABS: 12.5000 mg | ORAL_TABLET | Freq: Every day | ORAL | 2 refills | Status: DC

## 2020-12-18 MED ORDER — LOSARTAN POTASSIUM 25 MG PO TABS: 12.5000 mg | ORAL_TABLET | Freq: Every day | ORAL | 3 refills | Status: DC

## 2020-12-18 MED ORDER — LIDOCAINE HCL 1 % IJ SOLN
INTRAMUSCULAR | Status: AC
  Filled 2020-12-18: qty 20

## 2020-12-18 NOTE — Patient Instructions (Addendum)
EKG was performed today  STOP Entresto  START Losartan 12.5 mg (1/2 tablet) every night  DECREASE Spironolactone to 12.5 mg (1/2) tablet daily  Your physician recommends that you schedule a follow-up appointment in: 4 months  Wednesday November 9th, 2022 2:20 PM  milAt the [facility], you and your health needs are our priority. As part of our continuing mission to provide you with exceptional heart care, we have created designated Provider Care Teams. These Care Teams include your primary Cardiologist (physician) and Advanced Practice Providers (APPs- Physician Assistants and Nurse Practitioners) who all work together to provide you with the care you need, when you need it.   You may see any of the following providers on your designated Care Team at your next follow up: Dr Glori Bickers Dr Loralie Champagne Dr Patrice Paradise, NP Lyda Jester, [place] Ginnie Smart Audry Riles, PharmD   Please be sure to bring in all your medications bottles to every appointment.    If you have any questions or concerns before your next appointment please send [place] a message through [place] or call our office at [phone].    TO LEAVE A MESSAGE FOR THE NURSE SELECT OPTION 2, PLEASE LEAVE A MESSAGE INCLUDING: YOUR NAME DATE OF BIRTH CALL BACK NUMBER REASON FOR CALL**this is important as we prioritize the call backs  YOU WILL RECEIVE A CALL BACK THE SAME DAY AS LONG AS YOU CALL BEFORE 4:00 PM

## 2020-12-18 NOTE — Assessment & Plan Note (Signed)
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 She received last Aranesp injection February 2016. She hadrequired a total of 3 Aranesp injections in 2015, while she received at least 10 injections in 2014. So we discontinued therapy.  Current treatment: Reinitiated Aranesp 10/26/2020  Short bowel syndromeon Gattex, Teduglutide she is currently on TPN 3 nights a week She had surgery on her small intestine for blood clots in 2006 and since then she has short gut syndrome. This is the reason for her chronic disease.  Patient's baseline platelet count is around 100 09/17/2020: Hemoglobin 9.7, platelets 92 10/10/2020: Hemoglobin 8.5, platelets 52 10/22/2020:Hemoglobin 8.5, platelets 92 11/01/2020: Hemoglobin 9.8, platelets 104  Hospitalization 10/27/2020-11/01/2020: Sepsis with Klebsiella (left IJ removed 10/27/2020  Due to recent COVID infection, we will have to wait 3 weeks for her to receive a Aranesp injection.

## 2020-12-18 NOTE — Progress Notes (Signed)
ReDS Vest / Clip - 12/18/20 1000       ReDS Vest / Clip   Station Marker C    Ruler Value 26    ReDS Value Range Low volume    ReDS Actual Value 21

## 2020-12-18 NOTE — Progress Notes (Signed)
ADVANCED HF CLINIC NOTE  Referring Physician: Dr. Johnsie Cancel Primary Care: None Primary Cardiologist: Dr. Johnsie Cancel  HPI:  [name] [name] is a 70 y.o. 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 Johnsie Cancel for further evalaution of her progressive cardiomyopathy.    Echo 2015 found to have EF 45% with inferior wall and apical HK - she did not want to have heart cath. Myoview done at that time showed inferior/ apical scar with no ischemia She was not on beta blocker due to bradycardia and first degree block at baseline.   Carotid duplex 07/19/15 40-59% RICA stenosis repeat 01/13/20 plaque no stenosis with new lab criteria Echo 12/03/16 EF 40-45% AV sclerosis mean gradient 6 mmHg  She saw Dr. Johnsie Cancel recently in 10/21. Echo 11/21 EF 20-25% mild AS mean gradient 10. 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 Deferred any venography intervention  Cath 2/22: EF 35-40% Mild CAD  (pLAD 30%, mLCX 30%, dRCA 30%)ejection fraction. EF 35-40% with inferoapical aneurysm suggestive of prior infarct. No RHC attempted due to known lack of venous access  Admitted 3/22 with acute CVA/TIA with left-sided weakness. MRI of the brain, numerous small scattered acute cortical and subcortical infarct within the right cerebral hemisphere, right MCA territory as well as ACA and PCA territory. CTA of the head and neck, no large vessel occlusion.  70% atheromatous stenosis at the right carotid bulb.  50% stenosis at the left carotid bulb.  Echo 07/24/20 EF 35-40% with apical HK  no bubble done.  cMRI 4/22  EF 30% Akinesis of the mid-apical inferior wall, apical septum and aneurysm of the LV apex. LGE findings suggest prior  infarct with scar with no viability in the mid inferior wall and LV apex. 2.  RV normal EF 49% 3.  No LV apical thrombus.  Here for f/u with her husband. Had infection of TPN catheter in chest and now has catheter in right femoral vein. (Planning to do venous stenting and replace catheter up top). Had [place] in July but now recovered. Feels pretty good now. No CP or SOB. Very fatigued. Has lost a lot of weight.TPN regimen increased. LE edema resolved. SBP in 80s. Taking Entresto 24/26 bid and spiro 25 daily   ReDS 21%    Past Medical History:  Diagnosis Date   Abnormal LFTs 2013   Allergic rhinosinusitis    Anemia of chronic disease    At risk for dental problems    Atypical nevus    Bacteremia due to Klebsiella pneumoniae 12/12/2011   Bacterial overgrowth syndrome    Brachial vein thrombus, left ([place]) 10/08/2012   Carnitine deficiency ([place]) 05/25/2018   Carotid stenosis    Carotid US (9/15):  R 40-59%; L 1-39% >> FU 1 year   Closed left subtrochanteric femur fracture ([place]) 12/31/2017   Congestive heart failure (CHF) (HCC)    EF 25-30%   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   Fracture of left clavicle    History of blood transfusion 2013   anemia   Hx of cardiovascular stress test    Myoview (9/15):  inf-apical scar; no ischemia; EF 47% - low risk    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 12/12/2011  Osteoporosis    Pancytopenia 10/07/2011   Pathologic fracture of neck of femur (HCC)    Personal history of colonic polyps    Renal insufficiency    hx of yrs ago   Serratia marcescens infection - bactermia assoc w/ PICC 01/18/2015   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HCC)    Splenomegaly    By ultrasound   Stroke ([place]) 07/2020   Thrombophilia (HCC)    Vitamin D deficiency     Current Outpatient Medications  Medication Sig Dispense Refill   acetaminophen (TYLENOL) 500 MG tablet Take 1,000 mg by mouth daily as needed for mild  pain.     ADULT TPN Inject 1,800 mLs into the vein 4 (four) times a week. Pt receives home TPN from [facility]:  1800 mL bag, four nights weekly (Monday, Tuesday, Wednesday, Thursday for 8 hours (includes 1 hour taper up and down).     baclofen (LIORESAL) 10 MG tablet Take 1 tablet (10 mg total) by mouth 2 (two) times daily as needed for muscle spasms. 60 each 4   Calcium Carb-Cholecalciferol (CALCIUM + D3 PO) Take 2 tablets by mouth daily.     cetirizine (ZYRTEC) 10 MG tablet Take 10 mg by mouth at bedtime as needed for allergies.     diphenhydrAMINE (BENADRYL) 25 mg capsule Take 25-50 mg by mouth every 6 (six) hours as needed for itching or sleep.      fluticasone (FLONASE) 50 MCG/ACT nasal spray Place 2 sprays into both nostrils daily as needed for allergies or rhinitis.     furosemide (LASIX) 20 MG tablet TAKE ONE TABLET BY MOUTH AS NEEDED FOR SWELLING 45 tablet 1   Heparin Sodium, Porcine, (HEPARIN LOCK FLUSH IJ) Inject 5 mLs as directed 4 (four) times a week. Monday, Tuesday, Wednesday, Thursday in the morning with TPN     Multiple Vitamins-Minerals (ADULT GUMMY PO) Take 2 tablets by mouth daily.     omeprazole (PRILOSEC) 40 MG capsule Take 40 mg by mouth daily.     ondansetron (ZOFRAN-ODT) 8 MG disintegrating tablet TAKE 1 TABLET (8 MG TOTAL) BY MOUTH EVERY 8 (EIGHT) HOURS AS NEEDED FOR NAUSEA OR VOMITING. 30 tablet 11   sacubitril-valsartan (ENTRESTO) 24-26 MG Take 1 tablet by mouth 2 (two) times daily. 60 tablet 11   sodium chloride 0.9 % infusion Inject 900 mLs into the vein every 14 (fourteen) days. Uses for TPN     spironolactone (ALDACTONE) 25 MG tablet Take 1 tablet (25 mg total) by mouth daily. 30 tablet 11   Teduglutide, rDNA, 5 MG KIT Inject 3.1 application into the skin every morning.     triamcinolone cream (KENALOG) 0.1 % Apply 1 application topically as needed.     vitamin E 1000 UNIT capsule Take 1,000 Units by mouth daily.     XARELTO 20 MG TABS tablet TAKE 1 TABLET (20 MG  TOTAL) BY MOUTH DAILY WITH SUPPER. 90 tablet 2   No current facility-administered medications for this encounter.    Allergies  Allergen Reactions   Penicillins Itching, Swelling and Other (See Comments)    Reaction:  Facial swelling Has patient had a PCN reaction causing immediate rash, facial/tongue/throat swelling, SOB or lightheadedness with hypotension: Yes Has patient had a PCN reaction causing severe rash involving mucus membranes or skin necrosis: No Has patient had a PCN reaction that required hospitalization No Has patient had a PCN reaction occurring within the last 10 years:  No If all of the above answers are "NO", then may proceed with Cephalosporin use.      Social History   Socioeconomic History   Marital status: Married    Spouse name: Not on file   Number of children: Not on file   Years of education: Not on file   Highest education level: Not on file  Occupational History   Not on file  Tobacco Use   Smoking status: Former    Types: Cigarettes    Quit date: 05/12/2004    Years since quitting: 16.6   Smokeless tobacco: Never  Vaping Use   Vaping Use: Never used  Substance and Sexual Activity   Alcohol use: No   Drug use: No   Sexual activity: Yes    Comment: 1st intercourse 89 yo-5 partners  Other Topics Concern   Not on file  Social History Narrative   Married to [place], has 1 daughter and a granddaughter the patient's son died in childhood due to a motor vehicle wreck   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   No alcohol tobacco or drug use   04/07/2017   Social Determinants of Health   Financial Resource Strain: Not on file  Food Insecurity: Not on file  Transportation Needs: No Transportation Needs   Lack of Transportation (Medical): No   Lack of Transportation (Non-Medical): No  Physical Activity: Not on file  Stress: Not on file  Social Connections: Not on file  Intimate Partner Violence: Not on  file      Family History  Problem Relation Age of Onset   Diabetes Mother    Hypertension Mother    AAA (abdominal aortic aneurysm) Mother    Colon cancer Neg Hx    Stomach cancer Neg Hx     Vitals:   12/18/20 1026  BP: (!) 92/58  Pulse: (!) 58  Weight: 55 kg (121 lb 3.2 oz)   Wt Readings from Last 3 Encounters:  12/18/20 55 kg (121 lb 3.2 oz)  12/07/20 59.3 kg (130 lb 11.2 oz)  10/27/20 64.5 kg (142 lb 3.2 oz)     PHYSICAL EXAM: General:  Weak appearing. Thin. No resp difficulty HEENT: normal Neck: supple. no JVD. Carotids 2+ bilat; no bruits. No lymphadenopathy or thryomegaly appreciated. Cor: PMI nondisplaced. Regular rate & rhythm. 2/6 AS Lungs: clear Abdomen: soft, nontender, nondistended. No hepatosplenomegaly. No bruits or masses. Good bowel sounds. Extremities: no cyanosis, clubbing, rash, edema Neuro: alert & orientedx3, cranial nerves grossly intact. moves all 4 extremities w/o difficulty. Affect pleasant   REDS 20%   ASSESSMENT & PLAN:  1. Cardiomyopathy  - EF 45% echo 2015 RWMA inferior wall and apex she did not want to have heart cath.  - Myoview 2015  EF 47% inferior/ apical scar with no ischemia - Echo 12/03/16 reviewed EF 40-45% AV sclerosis mean gradient 6 mmHg - Echo 11/21 EF 20-25% mild AS mean gradient 10  - Cath 06/26/20: EF 35-40% Mild CAD  (pLAD 30%, mLCX 30%, dRCA 30%)ejection fraction. EF 35-40% with inferoapical aneurysm suggestive of prior infarct (depsite lack of significant CAD). No RHC attempted due to known lack of venous access - Echo 07/24/20 EF 35-40% with apical HK  no bubble done. Mild AS Personally reviewed - cMRI 4/22 EF 30% Akinesis of the mid-apical inferior wall, apical septum and aneurysm of the LV apex. LGE findings suggest prior infarct with scar with no viability in the mid inferior wall and  LV apex. RV normal  - NYHA III-IIIB but limitation primarily non-cardiac - Volume status ok. BP low.  - Stop Entresto. Switch to  Losartan 12.55m qhs - Not on b-blocker due to bradycardia - Decrease spiro to 12.5  - Consider SGLT2i at next visit.  - Cath with insignificant CAD of large infero-apical aneurysm in the absence of significant epicardial CAD. MRI confirms previous infarct. Possible thrombotic episode. Doubt she has LBBB cardiomyopathy as imaging doesn't match this  Likely not candidate for CRT-D due to diffuse venous occlusions and also high-risk of infection with ongoing TPN.  2. 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 Will reconsider at next visit  3. Hypercoaguable state - chronically occluded RIJ - h/o mesenteric infarct  - on Xarelto. No bleeding   4. 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 Has limited venous access  5. LBBB (1572m - as above. Doubt she qualifies for CRT  6. Aortic stenosis - mild by echo mean gradient 15  7. H/o CVA - f/u with Dr. SeLeonie Man- needs statin    DaGlori BickersMD  10:56 AM

## 2020-12-18 NOTE — Progress Notes (Signed)
Patient Care Team: Gatha Mayer, MD as PCP - General (Gastroenterology) Gatha Mayer, MD as Consulting Physician (Gastroenterology) Nicholas Lose, MD as Consulting Physician (Hematology and Oncology) Madelin Rear, [facility] as Pharmacist (Pharmacist)  DIAGNOSIS:    ICD-10-CM   1. Thrombocytopenia (HCC)  D69.6       CHIEF COMPLIANT: Follow-up for thrombocytopenia and anemia  INTERVAL HISTORY: [name] [name] is a 70 y.o. with above-mentioned history of thrombocytopenia and anemia. Labs on 12/07/20 RBC 2.93, Hg 8.9, HCT 27.0, platelets 93. She is currently on Xarelto for secondary stroke prevention. She presents to the clinic today for follow-up.  She has had extensive problems with her bowels with diarrhea and loose stools because of short-bowel syndrome.  She has just started to add 900 mL of normal saline to her fluids through the TPN starting yesterday.  She feels very weak and tired.  ALLERGIES:  is allergic to penicillins.  MEDICATIONS:  Current Outpatient Medications  Medication Sig Dispense Refill   acetaminophen (TYLENOL) 500 MG tablet Take 1,000 mg by mouth daily as needed for mild pain.     ADULT TPN Inject 1,800 mLs into the vein 4 (four) times a week. Pt receives home TPN from [facility]:  1800 mL bag, four nights weekly (Monday, Tuesday, Wednesday, Thursday for 8 hours (includes 1 hour taper up and down).     baclofen (LIORESAL) 10 MG tablet Take 1 tablet (10 mg total) by mouth 2 (two) times daily as needed for muscle spasms. 60 each 4   Calcium Carb-Cholecalciferol (CALCIUM + D3 PO) Take 2 tablets by mouth daily.     cetirizine (ZYRTEC) 10 MG tablet Take 10 mg by mouth at bedtime as needed for allergies.     diphenhydrAMINE (BENADRYL) 25 mg capsule Take 25-50 mg by mouth every 6 (six) hours as needed for itching or sleep.      fluticasone (FLONASE) 50 MCG/ACT nasal spray Place 2 sprays into both nostrils daily as needed for allergies or rhinitis.     furosemide (LASIX) 20 MG  tablet TAKE ONE TABLET BY MOUTH AS NEEDED FOR SWELLING 45 tablet 1   Heparin Sodium, Porcine, (HEPARIN LOCK FLUSH IJ) Inject 5 mLs as directed 4 (four) times a week. Monday, Tuesday, Wednesday, Thursday in the morning with TPN     losartan (COZAAR) 25 MG tablet Take 0.5 tablets (12.5 mg total) by mouth at bedtime. 90 tablet 3   Multiple Vitamins-Minerals (ADULT GUMMY PO) Take 2 tablets by mouth daily.     omeprazole (PRILOSEC) 40 MG capsule Take 40 mg by mouth daily.     ondansetron (ZOFRAN-ODT) 8 MG disintegrating tablet TAKE 1 TABLET (8 MG TOTAL) BY MOUTH EVERY 8 (EIGHT) HOURS AS NEEDED FOR NAUSEA OR VOMITING. 30 tablet 11   sodium chloride 0.9 % infusion Inject 900 mLs into the vein every 14 (fourteen) days. Uses for TPN     spironolactone (ALDACTONE) 25 MG tablet Take 0.5 tablets (12.5 mg total) by mouth daily. 30 tablet 2   Teduglutide, rDNA, 5 MG KIT Inject 3.1 application into the skin every morning.     triamcinolone cream (KENALOG) 0.1 % Apply 1 application topically as needed.     vitamin E 1000 UNIT capsule Take 1,000 Units by mouth daily.     XARELTO 20 MG TABS tablet TAKE 1 TABLET (20 MG TOTAL) BY MOUTH DAILY WITH SUPPER. 90 tablet 2   No current facility-administered medications for this visit.    PHYSICAL EXAMINATION: ECOG PERFORMANCE  STATUS: 1 - Symptomatic but completely ambulatory  Vitals:   12/19/20 0949  BP: (!) 67/45  Pulse: 63  Resp: 18  Temp: (!) 97.2 F (36.2 C)  SpO2: 99%   Filed Weights   12/19/20 0949  Weight: 128 lb 14.4 oz (58.5 kg)    LABORATORY DATA:  I have reviewed the data as listed CMP Latest Ref Rng & Units 12/19/2020 11/01/2020 10/29/2020  Glucose 70 - 99 mg/dL 55(L) 75 78  BUN 8 - 23 mg/dL 39(H) 7(L) 13  Creatinine 0.44 - 1.00 mg/dL 2.04(H) 1.23(H) 1.12(H)  Sodium 135 - 145 mmol/L 137 139 138  Potassium 3.5 - 5.1 mmol/L 2.3(LL) 3.1(L) 3.8  Chloride 98 - 111 mmol/L 115(H) 119(H) 117(H)  CO2 22 - 32 mmol/L 11(L) 17(L) 17(L)  Calcium 8.9 -  10.3 mg/dL 8.1(L) 8.0(L) 8.1(L)  Total Protein 6.5 - 8.1 g/dL 7.1 - 6.4(L)  Total Bilirubin 0.3 - 1.2 mg/dL 0.4 - 0.5  Alkaline Phos 38 - 126 U/L 110 - 87  AST 15 - 41 U/L 19 - 18  ALT 0 - 44 U/L 18 - 14    Lab Results  Component Value Date   WBC 9.1 12/19/2020   HGB 10.7 (L) 12/19/2020   HCT 33.7 (L) 12/19/2020   MCV 94.9 12/19/2020   PLT 98 (L) 12/19/2020   NEUTROABS 4.3 12/19/2020    ASSESSMENT & PLAN:  Thrombocytopenia (HCC) Normochromic normocytic anemia due 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 She received last Aranesp injection February 2016. She had required a total of 3 Aranesp injections in 2015, while she received at least 10 injections in 2014. So we discontinued therapy.   Current treatment:  Reinitiated Aranesp 10/26/2020.  Also received July 2022.  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 This is the reason for her chronic disease.  Patient's baseline platelet count is around 100 09/17/2020: Hemoglobin 9.7, platelets 92 10/10/2020: Hemoglobin 8.5, platelets 52 10/22/2020: Hemoglobin 8.5, platelets 92 11/01/2020: Hemoglobin 9.8, platelets 104 12/19/2020: Hemoglobin 10.7, platelets 98 We will hold off on giving any further Aranesp treatment.  Labs revealed that patient has acute on chronic renal failure with a creatinine of 2.04 and a potassium of 2.3 I will reach out to Dr. Carlean Purl to adjust her TPN.   Hospitalization 10/27/2020-11/01/2020: Sepsis with Klebsiella (left IJ removed 10/27/2020   Return to clinic in 6 months with labs and follow-up.   No orders of the defined types were placed in this encounter.  The patient has a good understanding of the overall plan. she agrees with it. she will call with any problems that may develop before the  next visit here.  Total time spent: 20 mins including face to face time and time spent for planning, charting and coordination of care  Rulon Eisenmenger, MD, MPH 12/19/2020  I, Thana Ates, am acting as scribe for Dr. Nicholas Lose.  I have reviewed the above documentation for accuracy and completeness, and I agree with the above.

## 2020-12-19 ENCOUNTER — Telehealth: Payer: Self-pay

## 2020-12-19 ENCOUNTER — Inpatient Hospital Stay (HOSPITAL_BASED_OUTPATIENT_CLINIC_OR_DEPARTMENT_OTHER): Payer: Medicare Other | Admitting: Hematology and Oncology

## 2020-12-19 ENCOUNTER — Inpatient Hospital Stay: Payer: Medicare Other | Attending: Hematology and Oncology

## 2020-12-19 ENCOUNTER — Other Ambulatory Visit: Payer: Self-pay

## 2020-12-19 ENCOUNTER — Other Ambulatory Visit: Payer: Self-pay | Admitting: Family Medicine

## 2020-12-19 ENCOUNTER — Other Ambulatory Visit: Payer: Self-pay | Admitting: Family

## 2020-12-19 ENCOUNTER — Inpatient Hospital Stay: Payer: Medicare Other

## 2020-12-19 VITALS — BP 67/45 | HR 63 | Temp 97.2°F | Resp 18 | Ht 68.0 in | Wt 128.9 lb

## 2020-12-19 DIAGNOSIS — D508 Other iron deficiency anemias: Secondary | ICD-10-CM

## 2020-12-19 DIAGNOSIS — D649 Anemia, unspecified: Secondary | ICD-10-CM | POA: Insufficient documentation

## 2020-12-19 DIAGNOSIS — D638 Anemia in other chronic diseases classified elsewhere: Secondary | ICD-10-CM

## 2020-12-19 DIAGNOSIS — D696 Thrombocytopenia, unspecified: Secondary | ICD-10-CM | POA: Insufficient documentation

## 2020-12-19 LAB — CBC WITH DIFFERENTIAL (CANCER CENTER ONLY)
Abs Immature Granulocytes: 0.03 10*3/uL (ref 0.00–0.07)
Basophils Absolute: 0 10*3/uL (ref 0.0–0.1)
Basophils Relative: 0 %
Eosinophils Absolute: 0.4 10*3/uL (ref 0.0–0.5)
Eosinophils Relative: 4 %
HCT: 33.7 % — ABNORMAL LOW (ref 36.0–46.0)
Hemoglobin: 10.7 g/dL — ABNORMAL LOW (ref 12.0–15.0)
Immature Granulocytes: 0 %
Lymphocytes Relative: 35 %
Lymphs Abs: 3.2 10*3/uL (ref 0.7–4.0)
MCH: 30.1 pg (ref 26.0–34.0)
MCHC: 31.8 g/dL (ref 30.0–36.0)
MCV: 94.9 fL (ref 80.0–100.0)
Monocytes Absolute: 1.2 10*3/uL — ABNORMAL HIGH (ref 0.1–1.0)
Monocytes Relative: 13 %
Neutro Abs: 4.3 10*3/uL (ref 1.7–7.7)
Neutrophils Relative %: 48 %
Platelet Count: 98 10*3/uL — ABNORMAL LOW (ref 150–400)
RBC: 3.55 MIL/uL — ABNORMAL LOW (ref 3.87–5.11)
RDW: 17.4 % — ABNORMAL HIGH (ref 11.5–15.5)
WBC Count: 9.1 10*3/uL (ref 4.0–10.5)
nRBC: 0 % (ref 0.0–0.2)

## 2020-12-19 LAB — IRON AND TIBC
Iron: 86 ug/dL (ref 41–142)
Saturation Ratios: 30 % (ref 21–57)
TIBC: 281 ug/dL (ref 236–444)
UIBC: 196 ug/dL (ref 120–384)

## 2020-12-19 LAB — CMP (CANCER CENTER ONLY)
ALT: 18 U/L (ref 0–44)
AST: 19 U/L (ref 15–41)
Albumin: 2.9 g/dL — ABNORMAL LOW (ref 3.5–5.0)
Alkaline Phosphatase: 110 U/L (ref 38–126)
Anion gap: 11 (ref 5–15)
BUN: 39 mg/dL — ABNORMAL HIGH (ref 8–23)
CO2: 11 mmol/L — ABNORMAL LOW (ref 22–32)
Calcium: 8.1 mg/dL — ABNORMAL LOW (ref 8.9–10.3)
Chloride: 115 mmol/L — ABNORMAL HIGH (ref 98–111)
Creatinine: 2.04 mg/dL — ABNORMAL HIGH (ref 0.44–1.00)
GFR, Estimated: 26 mL/min — ABNORMAL LOW (ref >60)
Glucose, Bld: 55 mg/dL — ABNORMAL LOW (ref 70–99)
Potassium: 2.3 mmol/L — CL (ref 3.5–5.1)
Sodium: 137 mmol/L (ref 135–145)
Total Bilirubin: 0.4 mg/dL (ref 0.3–1.2)
Total Protein: 7.1 g/dL (ref 6.5–8.1)

## 2020-12-19 LAB — FERRITIN: Ferritin: 79 ng/mL (ref 11–307)

## 2020-12-19 MED ORDER — CETIRIZINE HCL 10 MG PO TABS: 10.0000 mg | ORAL_TABLET | Freq: Every evening | ORAL | 1 refills | Status: DC | PRN

## 2020-12-19 NOTE — Telephone Encounter (Signed)
-----   Message from Gatha Mayer, MD sent at 12/19/2020 11:19 AM EDT ----- Regarding: RE: TPN Thanks.  I will get in touch with the TPN pharmacist  Rebeca Allegra can you send the most recent labs to [facility] pharmacist ----- Message ----- From: Nicholas Lose, MD Sent: 12/19/2020  10:20 AM EDT To: Gatha Mayer, MD Subject: [name] [name], I saw her today in my office and obtain blood work. She is found to be low in potassium and her creatinine has increased from 1.2 up to 2.0. She said that she is adding 900 mL of normal saline to her TPN starting only today. I wanted to reach out to you to see if she needs any adjustment in her TPN replacement therapy either with fluids or potassium. Thank you Vinay

## 2020-12-19 NOTE — Telephone Encounter (Signed)
Lab work from today faxed to [facility] infusion at fax # [phone] per Dr Celesta Aver request.

## 2020-12-19 NOTE — Progress Notes (Signed)
RN received call from infusion RN inquiring if patient would be getting IV iron therapy today.   RN reviewed with MD - Per MD patient does not need iron.  Appointments/staff updated.

## 2020-12-19 NOTE — Telephone Encounter (Signed)
Last visit 10/15/20. Medication was filled by a historical provider. Okay to fill?

## 2020-12-20 ENCOUNTER — Telehealth (HOSPITAL_COMMUNITY): Payer: Self-pay | Admitting: Radiology

## 2020-12-20 ENCOUNTER — Other Ambulatory Visit (HOSPITAL_COMMUNITY): Payer: Self-pay | Admitting: Interventional Radiology

## 2020-12-20 DIAGNOSIS — I871 Compression of vein: Secondary | ICD-10-CM

## 2020-12-20 NOTE — Telephone Encounter (Signed)
Called pt, left VM for her to call me back to schedule her SVC procedure with Dr. Pascal Lux on 12/28/20. JM

## 2020-12-21 ENCOUNTER — Telehealth: Payer: Self-pay

## 2020-12-21 DIAGNOSIS — E876 Hypokalemia: Secondary | ICD-10-CM

## 2020-12-21 NOTE — Telephone Encounter (Signed)
Had a voicemail today from pharmacy that prepares her TPN.  Patient had labs today and her K+ had decreased to 2.3 , BUN and Creat increased.  Patient recently COVID + and had significant diarrhea and vomiting with the COVID.  Merrilee Seashore at spoke with Ariani today and she feels much better. Knick was concerned about AKI.  Do you want to make any alterations to her TPN?  Or new labs next week?

## 2020-12-24 LAB — FATTY ACID PANEL, ESSENTIAL (C12-C22), SERUM
Arachidic, C20:0: 17.94 umol/L (ref 16.8–38.5)
Arachidonic, C20:4w6: 420.95 umol/L (ref 351.4–1057.6)
DHA, C22:6w3: 53.71 umol/L (ref 53.3–216.3)
DPA, C22:5w3: 21.97 umol/L (ref 19.9–77.6)
DPA, C22:5w6: 21.02 umol/L (ref 9.2–32.1)
DTA, C22:4w6: 22.2 umol/L (ref 9.1–44.6)
Docosenoic, C22:1: 6.24 umol/L (ref 5.73–11.92)
EPA, C20:5w3: 25.42 umol/L (ref 12.4–123)
Hexadecenoic, C16:1w9: 100.19 umol/L — ABNORMAL HIGH (ref 19.82–59.93)
LAURIC,C12:0: 9.5 umol/L (ref 4.3–36)
Linoleic, C18:2w6: 1263.72 umol/L — ABNORMAL LOW (ref 2653.4–6130.3)
Mead, C20:3w9: 34.99 umol/L (ref 10.3–41.3)
Myristic, C14:0: 152.58 umol/L (ref 39.4–258.2)
Nervonic, C24:1w9: 78.67 umol/L (ref 56.9–132.7)
Oleic, C18:1w9: 2040.72 umol/L (ref 872.4–4182.1)
Palmitic, C16:0: 2129.86 umol/L (ref 1671.1–4602.6)
Palmitoleic, C16:1w7: 239.67 umol/L (ref 68.5–570.2)
Stearic, C18:0: 509.38 umol/L — ABNORMAL LOW (ref 590.2–1377.2)
Total Fatty Acids: 7.57 umol/L — ABNORMAL LOW (ref 7.93–18.34)
Total Monounsaturated: 2.64 umol/L (ref 1.45–4.95)
Total Polyunsaturated: 2.06 umol/L — ABNORMAL LOW (ref 3.57–8.11)
Total Saturated: 2.87 umol/L (ref 2.5–6.4)
Total w3: 0.12 umol/L (ref 0.1–0.5)
Total w6: 1.89 umol/L — ABNORMAL LOW (ref 3.3–7.1)
Triene Tetraene Ratio: 0.08 — ABNORMAL HIGH (ref 0.02–0.05)
Vaccenic, C18:1w7: 144.04 umol/L (ref 84.8–260.8)
a Linolenic, C18:3w3: 19.66 umol/L — ABNORMAL LOW (ref 26.1–150.1)
g Linolenic, C18:3w6: 88.82 umol/L (ref 14.5–144.9)
h g-Linolenic, C20:3w6: 75.17 umol/L (ref 70–319.9)

## 2020-12-24 NOTE — Addendum Note (Signed)
Addended by: Marlon Pel on: 12/24/2020 04:31 PM   Modules accepted: Orders

## 2020-12-24 NOTE — Telephone Encounter (Signed)
Patient notified .  She will come for BMET in the am

## 2020-12-25 ENCOUNTER — Other Ambulatory Visit (INDEPENDENT_AMBULATORY_CARE_PROVIDER_SITE_OTHER): Payer: Medicare Other

## 2020-12-25 DIAGNOSIS — E876 Hypokalemia: Secondary | ICD-10-CM

## 2020-12-25 LAB — BASIC METABOLIC PANEL
BUN: 49 mg/dL — ABNORMAL HIGH (ref 6–23)
CO2: 21 mEq/L (ref 19–32)
Calcium: 8.3 mg/dL — ABNORMAL LOW (ref 8.4–10.5)
Chloride: 109 mEq/L (ref 96–112)
Creatinine, Ser: 1.4 mg/dL — ABNORMAL HIGH (ref 0.40–1.20)
GFR: 38.08 mL/min — ABNORMAL LOW (ref >60.00)
Glucose, Bld: 98 mg/dL (ref 70–99)
Potassium: 3.3 mEq/L — ABNORMAL LOW (ref 3.5–5.1)
Sodium: 137 mEq/L (ref 135–145)

## 2020-12-26 ENCOUNTER — Other Ambulatory Visit: Payer: Self-pay | Admitting: Internal Medicine

## 2020-12-26 DIAGNOSIS — E876 Hypokalemia: Secondary | ICD-10-CM

## 2020-12-26 DIAGNOSIS — K760 Fatty (change of) liver, not elsewhere classified: Secondary | ICD-10-CM

## 2020-12-26 NOTE — Progress Notes (Signed)
BMET orders entered.

## 2020-12-27 ENCOUNTER — Other Ambulatory Visit: Payer: Self-pay | Admitting: Internal Medicine

## 2020-12-27 ENCOUNTER — Other Ambulatory Visit: Payer: Self-pay | Admitting: Radiology

## 2020-12-28 ENCOUNTER — Other Ambulatory Visit: Payer: Self-pay

## 2020-12-28 ENCOUNTER — Other Ambulatory Visit (HOSPITAL_COMMUNITY): Payer: Self-pay | Admitting: Interventional Radiology

## 2020-12-28 ENCOUNTER — Ambulatory Visit (HOSPITAL_COMMUNITY)
Admission: RE | Admit: 2020-12-28 | Discharge: 2020-12-28 | Disposition: A | Discharge status: Home / Self Care | Payer: Medicare Other | Source: Ambulatory Visit | Attending: Interventional Radiology | Admitting: Interventional Radiology

## 2020-12-28 DIAGNOSIS — Z8673 Personal history of transient ischemic attack (TIA), and cerebral infarction without residual deficits: Secondary | ICD-10-CM | POA: Diagnosis not present

## 2020-12-28 DIAGNOSIS — Z7901 Long term (current) use of anticoagulants: Secondary | ICD-10-CM | POA: Diagnosis not present

## 2020-12-28 DIAGNOSIS — I82B21 Chronic embolism and thrombosis of right subclavian vein: Secondary | ICD-10-CM | POA: Diagnosis not present

## 2020-12-28 DIAGNOSIS — Z880 Allergy status to penicillin: Secondary | ICD-10-CM | POA: Insufficient documentation

## 2020-12-28 DIAGNOSIS — Z87891 Personal history of nicotine dependence: Secondary | ICD-10-CM | POA: Insufficient documentation

## 2020-12-28 DIAGNOSIS — Z452 Encounter for adjustment and management of vascular access device: Secondary | ICD-10-CM | POA: Insufficient documentation

## 2020-12-28 DIAGNOSIS — I509 Heart failure, unspecified: Secondary | ICD-10-CM | POA: Insufficient documentation

## 2020-12-28 DIAGNOSIS — Z79899 Other long term (current) drug therapy: Secondary | ICD-10-CM | POA: Insufficient documentation

## 2020-12-28 DIAGNOSIS — I871 Compression of vein: Secondary | ICD-10-CM | POA: Diagnosis not present

## 2020-12-28 DIAGNOSIS — I82B12 Acute embolism and thrombosis of left subclavian vein: Secondary | ICD-10-CM | POA: Diagnosis not present

## 2020-12-28 HISTORY — PX: IR VENOCAVAGRAM SVC: IMG679

## 2020-12-28 HISTORY — PX: IR US GUIDE VASC ACCESS LEFT: IMG2389

## 2020-12-28 HISTORY — PX: IR REMOVAL TUN CV CATH W/O FL: IMG2289

## 2020-12-28 HISTORY — PX: IR TRANSCATH PLC STENT  INITIAL VEIN  INC ANGIOPLASTY: IMG5445

## 2020-12-28 HISTORY — PX: IR FLUORO GUIDE CV LINE LEFT: IMG2282

## 2020-12-28 MED ORDER — IOHEXOL 240 MG/ML SOLN
50.0000 mL | Freq: Once | INTRAMUSCULAR | Status: AC | PRN
  Administered 2020-12-28: 20 mL via INTRAVENOUS

## 2020-12-28 MED ORDER — IOHEXOL 240 MG/ML SOLN
50.0000 mL | Freq: Once | INTRAMUSCULAR | Status: AC | PRN
  Administered 2020-12-28: 40 mL via INTRAVENOUS

## 2020-12-28 MED ORDER — FENTANYL CITRATE (PF) 100 MCG/2ML IJ SOLN
INTRAMUSCULAR | Status: AC
  Filled 2020-12-28: qty 2

## 2020-12-28 MED ORDER — MIDAZOLAM HCL 2 MG/2ML IJ SOLN
INTRAMUSCULAR | Status: AC
  Filled 2020-12-28: qty 2

## 2020-12-28 MED ORDER — VANCOMYCIN HCL IN DEXTROSE 1-5 GM/200ML-% IV SOLN
1000.0000 mg | Freq: Once | INTRAVENOUS | Status: AC
  Administered 2020-12-28: 1000 mg via INTRAVENOUS

## 2020-12-28 MED ORDER — HEPARIN SOD (PORK) LOCK FLUSH 100 UNIT/ML IV SOLN
INTRAVENOUS | Status: AC
  Filled 2020-12-28: qty 5

## 2020-12-28 MED ORDER — LIDOCAINE HCL 1 % IJ SOLN
INTRAMUSCULAR | Status: AC
  Filled 2020-12-28: qty 20

## 2020-12-28 MED ORDER — HEPARIN SOD (PORK) LOCK FLUSH 100 UNIT/ML IV SOLN
500.0000 [IU] | Freq: Once | INTRAVENOUS | Status: AC
  Administered 2020-12-28: 250 [IU] via INTRAVENOUS

## 2020-12-28 MED ORDER — VANCOMYCIN HCL IN DEXTROSE 1-5 GM/200ML-% IV SOLN
INTRAVENOUS | Status: AC
  Filled 2020-12-28: qty 200

## 2020-12-28 MED ORDER — FENTANYL CITRATE (PF) 100 MCG/2ML IJ SOLN
INTRAMUSCULAR | Status: AC | PRN
  Administered 2020-12-28 (×4): 25 ug via INTRAVENOUS

## 2020-12-28 MED ORDER — SODIUM CHLORIDE 0.9 % IV SOLN: INTRAVENOUS | Status: DC

## 2020-12-28 MED ORDER — MIDAZOLAM HCL 2 MG/2ML IJ SOLN
INTRAMUSCULAR | Status: AC | PRN
  Administered 2020-12-28 (×4): 0.5 mg via INTRAVENOUS

## 2020-12-28 NOTE — Procedures (Signed)
Pre-procedure Diagnosis: Poor venous access Post-procedure Diagnosis: Same  Successful recanalization of left upper extremity central venous occlusion requiring angioplasty and stent placement allowing for placement of a new L IJ approach tunneled CVC. The new tunneled CVC is ready for immediate use.  Successful removal of pre-existing right CFV approach CVC.  Complications: None Immediate EBL: Minimal   Ronny Bacon, MD Pager #: [phone]

## 2020-12-28 NOTE — H&P (Signed)
Chief Complaint: Chronic venous occlusion. Request is for venacavagram with possible SVC recannulization.   Referring Physician(s): Sharia Reeve  Supervising Physician: Simonne Come  Patient Status: [facility] - Out-pt  History of Present Illness: [name] [name] is a 70 y.o. female outpatient. Well known to IR. 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 Recently admitted for bacteremia with LIJ catheter removed on 6.18.22 for line holiday.  Attempt to replaced  LIJ catheter on 6.23.22  unsuccessful due to left brachiocephalic occlusion. A right femoral catheter was placed at that time. CT chest from 6.30.22 reads Chronic occlusion of the SVC, bilateral innominate veins, the central most aspect of the right subclavian vein and the right internal jugular vein, the sequela of previous chronic central Patient was consulted on 7.6.22  and 7.19.22 via the telephone with Dr. Odis Luster where potential options were discussed. After discussion with her family [name] has decided to proceed with  venacavagram with possible SVC recanalization.  Of note patient was seen in the ED on 8.8.22 due to retraction of the catheter. The catheter was exchanged on 8.9.22. Currently without any significant complaints. Patient alert and laying in bed, calm and comfortable. Denies any fevers, headache, chest pain, SOB, cough, abdominal pain, nausea, vomiting or bleeding. 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HCC) 05/25/2018   Carotid  stenosis    Carotid US (9/15):  R 40-59%; L 1-39% >> FU 1 year   Closed left subtrochanteric femur fracture (HCC) 12/31/2017   Congestive heart failure (CHF) (HCC)    EF 25-30%   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   Fracture of left clavicle    History of blood transfusion 2013   anemia   Hx of cardiovascular stress test    Myoview (9/15):  inf-apical scar; no ischemia; EF 47% - low risk    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 12/12/2011   Osteoporosis    Pancytopenia 10/07/2011   Pathologic fracture of neck of femur (HCC)    Personal history of colonic polyps    Renal insufficiency    hx of yrs ago   Serratia marcescens infection - bactermia assoc w/ PICC 01/18/2015   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HCC)    Splenomegaly    By ultrasound   Stroke (HCC) 07/2020   Thrombophilia (HCC)    Vitamin D deficiency     Past Surgical History:  Procedure Laterality Date   APPENDECTOMY  yrs ago   CHOLECYSTECTOMY  yrs ago   COLONOSCOPY  12/05/2005   internal hemorrhoids (for polyp surveillance)   COLONOSCOPY  04/26/2012   Procedure: COLONOSCOPY;  Surgeon: Iva Boop, MD;  Location: [facility];  Service: Endoscopy;  Laterality: N/A;   COLONOSCOPY WITH PROPOFOL N/A 03/18/2016   Procedure: COLONOSCOPY WITH PROPOFOL;  Surgeon: Iva Boop, MD;  Location: [facility];  Service: Endoscopy;  Laterality: N/A;   ESOPHAGOGASTRODUODENOSCOPY  01/22/2009   erosive esophagitis  HARDWARE REMOVAL Left 12/31/2017   Procedure: HARDWARE REMOVAL;  Surgeon: Rod Can, MD;  Location: [facility];  Service: Orthopedics;  Laterality: Left;   INTRAMEDULLARY (IM) NAIL INTERTROCHANTERIC Left 12/31/2017   Procedure: INTRAMEDULLARY (IM) NAIL SUBTROCHANTRIC;  Surgeon: Rod Can, MD;  Location: [facility];  Service: Orthopedics;  Laterality: Left;   IR CV LINE INJECTION  03/23/2018   IR FLUORO GUIDE CV LINE LEFT  01/01/2018   IR FLUORO GUIDE CV LINE LEFT   03/24/2018   IR FLUORO GUIDE CV LINE LEFT  05/21/2018   IR FLUORO GUIDE CV LINE LEFT  07/09/2018   IR FLUORO GUIDE CV LINE RIGHT  11/01/2020   IR GENERIC HISTORICAL  07/04/2016   IR REMOVAL TUN CV CATH W/O FL 07/04/2016 Ascencion Dike, PA-C [facility]   IR GENERIC HISTORICAL  07/10/2016   IR US GUIDE VASC ACCESS LEFT 07/10/2016 Arne Cleveland, MD [facility]   IR GENERIC HISTORICAL  07/10/2016   IR FLUORO GUIDE CV LINE LEFT 07/10/2016 Arne Cleveland, MD [facility]   IR PATIENT EVAL TECH 0-60 MINS  10/07/2017   IR PATIENT EVAL TECH 0-60 MINS  03/09/2018   IR RADIOLOGIST EVAL & MGMT  07/21/2017   IR RADIOLOGIST EVAL & MGMT  11/14/2020   IR RADIOLOGIST EVAL & MGMT  11/27/2020   IR US GUIDE VASC ACCESS LEFT  11/01/2020   IR US GUIDE VASC ACCESS RIGHT  11/01/2020   IR VENO/EXT/UNI LEFT  11/01/2020   LEFT HEART CATH AND CORONARY ANGIOGRAPHY N/A 06/26/2020   Procedure: LEFT HEART CATH AND CORONARY ANGIOGRAPHY;  Surgeon: Jolaine Artist, MD;  Location: [facility] [facility];  Service: Cardiovascular;  Laterality: N/A;   ORIF PROXIMAL FEMORAL FRACTURE W/ ITST NAIL SYSTEM  03/2007   left, Dr. Shellia Carwin   SMALL INTESTINE SURGERY  2005   multiple with right colon resection for ischemia/infarct    Allergies: Penicillins  Medications: Prior to Admission medications   Medication Sig Start Date End Date Taking? Authorizing Provider  acetaminophen (TYLENOL) 500 MG tablet Take 1,000 mg by mouth daily as needed for mild pain.   Yes [provider]  ADULT TPN Inject 1,800 mLs into the vein 4 (four) times a week. Pt receives home TPN from [facility]:  1800 mL bag, four nights weekly (Monday, Tuesday, Wednesday, Thursday for 8 hours (includes 1 hour taper up and down).   Yes [provider]  Calcium Carb-Cholecalciferol (CALCIUM + D3 PO) Take 2 tablets by mouth daily.   Yes [provider]  cetirizine (ZYRTEC) 10 MG tablet TAKE 1 TABLET BY MOUTH EVERY DAY 12/19/20  Yes Dutch Quint B, FNP   cetirizine (ZYRTEC) 10 MG tablet Take 1 tablet (10 mg total) by mouth at bedtime as needed for allergies. 12/19/20  Yes Dutch Quint B, FNP  fluticasone (FLONASE) 50 MCG/ACT nasal spray Place 2 sprays into both nostrils daily as needed for allergies or rhinitis.   Yes [provider]  furosemide (LASIX) 20 MG tablet TAKE ONE TABLET BY MOUTH AS NEEDED FOR SWELLING 08/01/20  Yes Bensimhon, Shaune Pascal, MD  Heparin Sodium, Porcine, (HEPARIN LOCK FLUSH IJ) Inject 5 mLs as directed 4 (four) times a week. Monday, Tuesday, Wednesday, Thursday in the morning with TPN   Yes [provider]  losartan (COZAAR) 25 MG tablet Take 0.5 tablets (12.5 mg total) by mouth at bedtime. 12/18/20 03/18/21 Yes Bensimhon, Shaune Pascal, MD  Multiple Vitamins-Minerals (ADULT GUMMY PO) Take 2 tablets by mouth daily.   Yes  [provider]  omeprazole (PRILOSEC) 40 MG capsule Take 40 mg by mouth daily.   Yes [provider]  ondansetron (ZOFRAN-ODT) 8 MG disintegrating tablet TAKE 1 TABLET (8 MG TOTAL) BY MOUTH EVERY 8 (EIGHT) HOURS AS NEEDED FOR NAUSEA OR VOMITING. 11/21/20  Yes Iva Boop, MD  sodium chloride 0.9 % infusion Inject 900 mLs into the vein every 14 (fourteen) days. Uses for TPN 10/27/17  Yes [provider]  spironolactone (ALDACTONE) 25 MG tablet Take 0.5 tablets (12.5 mg total) by mouth daily. 12/18/20  Yes Bensimhon, Bevelyn Buckles, MD  Teduglutide, rDNA, 5 MG KIT Inject 3.1 application into the skin every morning.   Yes [provider]  vitamin E 1000 UNIT capsule Take 1,000 Units by mouth daily.   Yes [provider]  baclofen (LIORESAL) 10 MG tablet Take 1 tablet (10 mg total) by mouth 2 (two) times daily as needed for muscle spasms. 08/29/20   Ihor Austin, NP  diphenhydrAMINE (BENADRYL) 25 mg capsule Take 25-50 mg by mouth every 6 (six) hours as needed for itching or sleep.     [provider]  triamcinolone cream (KENALOG) 0.1 % Apply 1 application  topically as needed.    [provider]  XARELTO 20 MG TABS tablet TAKE 1 TABLET (20 MG TOTAL) BY MOUTH DAILY WITH SUPPER. 11/13/20   [name], MD     Family History  Problem Relation Age of Onset   Diabetes Mother    Hypertension Mother    AAA (abdominal aortic aneurysm) Mother    Colon cancer Neg Hx    Stomach cancer Neg Hx     Social History   Socioeconomic History   Marital status: Married    Spouse name: Not on file   Number of children: Not on file   Years of education: Not on file   Highest education level: Not on file  Occupational History   Not on file  Tobacco Use   Smoking status: Former    Types: Cigarettes    Quit date: 05/12/2004    Years since quitting: 16.6   Smokeless tobacco: Never  Vaping Use   Vaping Use: Never used  Substance and Sexual Activity   Alcohol use: No   Drug use: No   Sexual activity: Yes    Comment: 1st intercourse 4 yo-5 partners  Other Topics Concern   Not on file  Social History Narrative   Married to [place], has 1 daughter and a granddaughter the patient's son died in childhood due to a motor vehicle wreck   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  All other systems are negative.  Review of Systems  Constitutional:  Negative for fatigue and fever.  HENT:  Negative for congestion.   Respiratory:  Negative for cough and shortness of breath.   Gastrointestinal:  Negative for abdominal pain, diarrhea, nausea  and vomiting.   Vital Signs: BP (!) 104/58  Pulse (!) 49   Temp (!) 97.5 F (36.4 C) (Oral)   Ht $R'5\' 8"'ao$  (1.727 m)   Wt 135 lb (61.2 kg)   LMP 02/26/2014   SpO2 100%   BMI 20.53 kg/m   Physical Exam Vitals and nursing note reviewed.  Constitutional:      Appearance: She is well-developed.  HENT:     Head: Normocephalic and atraumatic.  Eyes:     Conjunctiva/sclera: Conjunctivae normal.  Cardiovascular:     Rate and Rhythm: Normal rate and regular rhythm.     Heart sounds: Normal heart sounds.  Pulmonary:     Effort: Pulmonary effort is normal.     Breath sounds: Normal breath sounds.  Musculoskeletal:     Cervical back: Normal range of motion.  Neurological:     Mental Status: She is alert and oriented to person, place, and time.    Imaging: DG Hip Unilat W or Wo Pelvis 2-3 Views Right  Result Date: 12/17/2020 CLINICAL DATA:  PICC placement EXAM: DG HIP (WITH OR WITHOUT PELVIS) 2-3V RIGHT COMPARISON:  None. FINDINGS: There is a right femoral approach angio catheter with tip at the level of L3. Prior left proximal femur fixation. Mild bilateral hip degenerative changes. No acute osseous abnormality. Heterotopic ossification near the iliac crest. IMPRESSION: Right femoral approach PICC with tip at the level of L3. Electronically Signed   By: Maurine Simmering   On: 12/17/2020 14:21   IR PICC REPLACEMENT RIGHT INC IMG GUIDE  Result Date: 12/18/2020 INDICATION: Chronic TPN, retracted right femoral PICC line, short gut syndrome EXAM: FLUOROSCOPIC EXCHANGE OF THE RIGHT FEMORAL TUNNELED PICC LINE MEDICATIONS: 1% LIDOCAINE LOCAL CONTRAST:  None FLUOROSCOPY TIME:  54.0 seconds (5 mGy) COMPLICATIONS: None immediate. TECHNIQUE: The procedure, risks, benefits, and alternatives were explained to the patient and informed written consent was obtained. A timeout was performed prior to the initiation of the procedure. The retracted right femoral tunneled PICC line was exchanged over a stiff Glidewire.  Catheter tip position at the IVC right atrial junction. Catheter length of 47 cm. Images obtained for documentation. Blood aspirated easily followed by saline and heparin flushes. Catheter secured with a 2 0 Prolene suture and a sterile dressing. No immediate complication. FINDINGS: After catheter placement, the tip lies within the IVC right atrial junction the catheter aspirates and flushes normally and is ready for immediate use. IMPRESSION: Successful fluoroscopic exchange of the right femoral tunneled single-lumen power PICC line. Tip IVC right atrial junction. 47 cm length. The PICC line is ready for immediate use. Electronically Signed   By: Jerilynn Mages.  Shick M.D.   On: 12/18/2020 14:25   [place] EKG SITE RITE  Result Date: 12/17/2020 If Site Rite image not attached, placement could not be confirmed due to current cardiac rhythm.   Labs:  CBC: Recent Labs    10/30/20 0429 11/01/20 0409 12/07/20 1153 12/19/20 0901  WBC 5.3 6.7 6.4 9.1  HGB 8.7* 9.8* 8.9* 10.7*  HCT 30.7* 32.7* 27.0* 33.7*  PLT 84* 104* 93* 98*    COAGS: Recent Labs    07/23/20 1920  INR 1.8*  APTT 35    BMP: Recent Labs    10/28/20 0447 10/29/20 0447 11/01/20 0409 12/19/20 0901 12/25/20 1040  NA 137 138 139 137 137  K 3.4* 3.8 3.1* 2.3* 3.3*  CL 113* 117* 119* 115* 109  CO2 18* 17* 17* 11* 21  GLUCOSE 74 78 75 55* 98  BUN 23 13 7* 39* 49*  CALCIUM 7.8*  8.1* 8.0* 8.1* 8.3*  CREATININE 1.31* 1.12* 1.23* 2.04* 1.40*  GFRNONAA 44* 53* 47* 26*  --     LIVER FUNCTION TESTS: Recent Labs    10/27/20 0947 10/28/20 0447 10/29/20 0447 12/19/20 0901  BILITOT 0.6 0.3 0.5 0.4  AST $Re'19 18 18 19  'lri$ ALT $R'16 16 14 18  'xj$ ALKPHOS 89 77 87 110  PROT 6.9 5.9* 6.4* 7.1  ALBUMIN 2.8* 2.3* 2.5* 2.9*     Assessment and Plan:  70 y.o. female outpatient.Well known to IR. 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 Recently admitted for bacteremia with LIJ catheter removed on 6.18.22 for line holiday.  Attempt to replaced  LIJ catheter on 6.23.22  unsuccessful due to left brachiocephalic occlusion. A right femoral catheter was placed at that time. CT chest from 6.30.22 reads Chronic occlusion of the SVC, bilateral innominate veins, the central most aspect of the right subclavian vein and the right internal jugular vein, the sequela of previous chronic central Patient was consulted on 7.6.22  and 7.19.22 via the telephone with Dr. Owens Shark where potential options were discussed.After discussion with her family Ms Bedoya has decided to proceed with  venacavagram with possible SVC recanalization.   Of note patient was seen in the ED on 8.8.22 due to retraction of the catheter. The catheter was exchanged on 8.9.22   Labs from 8.16.22 BUN 49, Cr 1.4 are within acceptable parameters. Patient is on Xarelto. Last dose take in 8.17.22 Allergies include PCN. Patient has been NPO since midnight.   Risks and benefits of venacavagram with intervention were discussed with the patient including, but not limited to bleeding, infection, vascular injury, contrast induced renal failure, stroke, reperfusion hemorrhage, or even death. This interventional procedure involves the use of X-rays and because of the nature of the planned procedure, it is possible that we will have prolonged use of X-ray fluoroscopy. Potential radiation risks to you include (but are not limited to) the following: - A slightly elevated risk for cancer  several years later in life. This risk is typically less than 0.5% percent. This risk is low in comparison to the normal incidence of human cancer, which is 33% for women and 50% for men according to the [place]. - Radiation induced injury can include skin redness, resembling a rash, tissue breakdown / ulcers and hair loss (which can be temporary or permanent).  The likelihood  of either of these occurring depends on the difficulty of the procedure and whether you are sensitive to radiation due to previous procedures, disease, or genetic conditions.  IF your procedure requires a prolonged use of radiation, you will be notified and given written instructions for further action.  It is your responsibility to monitor the irradiated area for the 2 weeks following the procedure and to notify your physician if you are concerned that you have suffered a radiation induced injury.   All of the patient's questions were answered, patient is agreeable to proceed. Consent signed and in chart.   Thank you for this interesting consult.  I greatly enjoyed meeting Mercede C Culotta and look forward to participating in their care.  A copy of this report was sent to the requesting provider on this date.  Electronically Signed: Jacqualine Mau, NP 12/28/2020, 8:36 AM   I spent a total of  30 Minutes   in face to face in  clinical consultation, greater than 50% of which was counseling/coordinating care for venacavagram with possible intervention.

## 2021-01-04 ENCOUNTER — Other Ambulatory Visit (INDEPENDENT_AMBULATORY_CARE_PROVIDER_SITE_OTHER): Payer: Medicare Other

## 2021-01-04 DIAGNOSIS — E876 Hypokalemia: Secondary | ICD-10-CM

## 2021-01-04 DIAGNOSIS — K760 Fatty (change of) liver, not elsewhere classified: Secondary | ICD-10-CM | POA: Diagnosis not present

## 2021-01-04 LAB — BASIC METABOLIC PANEL
BUN: 39 mg/dL — ABNORMAL HIGH (ref 6–23)
CO2: 20 mEq/L (ref 19–32)
Calcium: 8.1 mg/dL — ABNORMAL LOW (ref 8.4–10.5)
Chloride: 112 mEq/L (ref 96–112)
Creatinine, Ser: 1.19 mg/dL (ref 0.40–1.20)
GFR: 46.27 mL/min — ABNORMAL LOW (ref >60.00)
Glucose, Bld: 95 mg/dL (ref 70–99)
Potassium: 3.4 mEq/L — ABNORMAL LOW (ref 3.5–5.1)
Sodium: 138 mEq/L (ref 135–145)

## 2021-01-07 ENCOUNTER — Telehealth: Payer: Self-pay | Admitting: Internal Medicine

## 2021-01-07 NOTE — Telephone Encounter (Signed)
Adult TPN order form came and has been placed on Dr Celesta Aver desk to be signed upon his return.

## 2021-01-07 NOTE — Telephone Encounter (Signed)
I spoke to [name] [name] and she is going to reach out to the pharmacy and get them to fax the paperwork to [phone] and she is aware Dr Carlean Purl is out this week.

## 2021-01-08 ENCOUNTER — Other Ambulatory Visit: Payer: Self-pay | Admitting: Interventional Radiology

## 2021-01-08 DIAGNOSIS — I871 Compression of vein: Secondary | ICD-10-CM

## 2021-01-09 ENCOUNTER — Other Ambulatory Visit: Payer: Self-pay | Admitting: Internal Medicine

## 2021-01-16 NOTE — Telephone Encounter (Signed)
TPN order form signed and faxed to # [phone], got confirmation it went thru.

## 2021-01-24 ENCOUNTER — Encounter: Payer: Self-pay | Admitting: *Deleted

## 2021-01-24 ENCOUNTER — Ambulatory Visit
Admission: RE | Admit: 2021-01-24 | Discharge: 2021-01-24 | Disposition: A | Discharge status: Home / Self Care | Payer: Medicare Other | Source: Ambulatory Visit | Attending: Interventional Radiology | Admitting: Interventional Radiology

## 2021-01-24 ENCOUNTER — Other Ambulatory Visit: Payer: Self-pay

## 2021-01-24 DIAGNOSIS — I871 Compression of vein: Secondary | ICD-10-CM

## 2021-01-24 DIAGNOSIS — I82211 Chronic embolism and thrombosis of superior vena cava: Secondary | ICD-10-CM | POA: Diagnosis not present

## 2021-01-24 DIAGNOSIS — I878 Other specified disorders of veins: Secondary | ICD-10-CM | POA: Diagnosis not present

## 2021-01-24 HISTORY — PX: IR RADIOLOGIST EVAL & MGMT: IMG5224

## 2021-01-24 NOTE — Progress Notes (Signed)
Patient ID: [name] [name], female   DOB: Feb 09, 1951, 70 y.o.   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  Referring Physician(s): Gessner,Carl E   History of Present Illness: [name] [name] is a 70 y.o. 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  Patient was admitted to the hospital In mid June with symptoms of bacteremia, ultimately requiring a catheter holiday and removal of chronic left jugular approach central venous catheter.  Unfortunately, attempt at replacement of the left internal jugular central venous catheter at that time confirmed left-sided central venous obstruction and as such patient underwent placement of a right common femoral approach tunneled central venous catheter for durable intravenous access needs.  Patient ultimately once evaluation including acquisition of SVC protocol chest CT performed 10/12/2020 and ultimately underwent technically successful left upper extremity venogram, central venous recanalization and left internal jugular approach central venous catheter placement on 12/28/2020 (note, procedure was delayed secondary to her contracting COVID).  She is seen today in telemedicine consultation for postprocedural evaluation and management.  The patient states that the catheter retention suture was causing some skin irritation and therefore she removed it.    She is otherwise without catheter or procedure related complaint and states the catheter is functioning well without incident.  Note, patient denies significant history of previous upper extremity or facial swelling prior to  the procedure and is again without this specified complaint following the recanalization.   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   Carnitine deficiency ([place]) 05/25/2018   Carotid stenosis    Carotid US (9/15):  R 40-59%; L 1-39% >> FU 1 year   Closed left subtrochanteric femur fracture (H[facility]) 12/31/2017   Congestive heart failure (CHF) (HCC)    EF 25-30%   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   Fracture of left clavicle    History of blood transfusion 2013   anemia   Hx of cardiovascular stress test    Myoview (9/15):  inf-apical scar; no ischemia; EF 47% - low risk    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 12/12/2011   Osteoporosis    Pancytopenia 10/07/2011   Pathologic fracture of neck of femur ([place])    Personal history of colonic polyps    Renal insufficiency    hx of yrs ago   Serratia marcescens infection - bactermia assoc w/ PICC 01/18/2015   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[facility])    Splenomegaly    By ultrasound   Stroke ([place]) 07/2020   Thrombophilia ([place])    Vitamin D deficiency     Past Surgical History:  Procedure Laterality Date   APPENDECTOMY  yrs ago   CHOLECYSTECTOMY  yrs ago   COLONOSCOPY  12/05/2005   internal hemorrhoids (for polyp surveillance)   COLONOSCOPY  04/26/2012   Procedure: COLONOSCOPY;  Surgeon:  Gatha Mayer, MD;  Location: [facility];  Service: Endoscopy;  Laterality: N/A;   COLONOSCOPY WITH PROPOFOL N/A 03/18/2016   Procedure: COLONOSCOPY WITH PROPOFOL;  Surgeon: Gatha Mayer, MD;  Location: [facility];  Service: Endoscopy;  Laterality: N/A;   ESOPHAGOGASTRODUODENOSCOPY  01/22/2009   erosive esophagitis   HARDWARE REMOVAL Left  12/31/2017   Procedure: HARDWARE REMOVAL;  Surgeon: Rod Can, MD;  Location: [facility];  Service: Orthopedics;  Laterality: Left;   INTRAMEDULLARY (IM) NAIL INTERTROCHANTERIC Left 12/31/2017   Procedure: INTRAMEDULLARY (IM) NAIL SUBTROCHANTRIC;  Surgeon: Rod Can, MD;  Location: [facility];  Service: Orthopedics;  Laterality: Left;   IR CV LINE INJECTION  03/23/2018   IR FLUORO GUIDE CV LINE LEFT  01/01/2018   IR FLUORO GUIDE CV LINE LEFT  03/24/2018   IR FLUORO GUIDE CV LINE LEFT  05/21/2018   IR FLUORO GUIDE CV LINE LEFT  07/09/2018   IR FLUORO GUIDE CV LINE LEFT  12/28/2020   IR FLUORO GUIDE CV LINE RIGHT  11/01/2020   IR GENERIC HISTORICAL  07/04/2016   IR REMOVAL TUN CV CATH W/O FL 07/04/2016 Ascencion Dike, PA-C [facility]   IR GENERIC HISTORICAL  07/10/2016   IR US GUIDE VASC ACCESS LEFT 07/10/2016 Arne Cleveland, MD [facility]   IR GENERIC HISTORICAL  07/10/2016   IR FLUORO GUIDE CV LINE LEFT 07/10/2016 Arne Cleveland, MD [facility]   IR PATIENT EVAL TECH 0-60 MINS  10/07/2017   IR PATIENT EVAL TECH 0-60 MINS  03/09/2018   IR RADIOLOGIST EVAL & MGMT  07/21/2017   IR RADIOLOGIST EVAL & MGMT  11/14/2020   IR RADIOLOGIST EVAL & MGMT  11/27/2020   IR REMOVAL TUN CV CATH W/O FL  12/28/2020   IR TRANSCATH PLC STENT  INITIAL VEIN  INC ANGIOPLASTY  12/28/2020   IR US GUIDE VASC ACCESS LEFT  11/01/2020   IR US GUIDE VASC ACCESS LEFT  12/28/2020   IR US GUIDE VASC ACCESS LEFT  12/28/2020   IR US GUIDE VASC ACCESS LEFT  12/28/2020   IR US GUIDE VASC ACCESS RIGHT  11/01/2020   IR VENO/EXT/UNI LEFT  11/01/2020   IR VENOCAVAGRAM SVC  12/28/2020   LEFT HEART CATH AND CORONARY ANGIOGRAPHY N/A 06/26/2020   Procedure: LEFT HEART CATH AND CORONARY ANGIOGRAPHY;  Surgeon: Jolaine Artist, MD;  Location: [facility];  Service: Cardiovascular;  Laterality: N/A;   ORIF PROXIMAL FEMORAL FRACTURE W/ ITST NAIL SYSTEM  03/2007   left, Dr. Shellia Carwin   SMALL INTESTINE SURGERY  2005   multiple with right  colon resection for ischemia/infarct    Allergies: Penicillins  Medications: Prior to Admission medications   Medication Sig Start Date End Date Taking? Authorizing Provider  acetaminophen (TYLENOL) 500 MG tablet Take 1,000 mg by mouth daily as needed for mild pain.    [provider]  ADULT TPN Inject 1,800 mLs into the vein 4 (four) times a week. Pt receives home TPN from [facility]:  1800 mL bag, four nights weekly (Monday, Tuesday, Wednesday, Thursday for 8 hours (includes 1 hour taper up and down).    [provider]  baclofen (LIORESAL) 10 MG tablet Take 1 tablet (10 mg total) by mouth 2 (two) times daily as needed for muscle spasms. 08/29/20   Frann Rider, NP  Calcium Carb-Cholecalciferol (CALCIUM + D3 PO) Take 2 tablets by mouth daily.    [provider]  cetirizine (ZYRTEC) 10 MG tablet TAKE 1 TABLET BY MOUTH  EVERY DAY 12/19/20   Kennyth Arnold, FNP  cetirizine (ZYRTEC) 10 MG tablet Take 1 tablet (10 mg total) by mouth at bedtime as needed for allergies. 12/19/20   Kennyth Arnold, FNP  diphenhydrAMINE (BENADRYL) 25 mg capsule Take 25-50 mg by mouth every 6 (six) hours as needed for itching or sleep.     [provider]  fluticasone (FLONASE) 50 MCG/ACT nasal spray Place 2 sprays into both nostrils daily as needed for allergies or rhinitis.    [provider]  furosemide (LASIX) 20 MG tablet TAKE ONE TABLET BY MOUTH AS NEEDED FOR SWELLING 08/01/20   Bensimhon, Shaune Pascal, MD  Heparin Sodium, Porcine, (HEPARIN LOCK FLUSH IJ) Inject 5 mLs as directed 4 (four) times a week. Monday, Tuesday, Wednesday, Thursday in the morning with TPN    [provider]  losartan (COZAAR) 25 MG tablet Take 0.5 tablets (12.5 mg total) by mouth at bedtime. 12/18/20 03/18/21  Bensimhon, Shaune Pascal, MD  Multiple Vitamins-Minerals (ADULT GUMMY PO) Take 2 tablets by mouth daily.    [provider]  omeprazole (PRILOSEC) 40 MG capsule Take 1 capsule (40 mg  total) by mouth daily before breakfast. 01/09/21   Gatha Mayer, MD  ondansetron (ZOFRAN-ODT) 8 MG disintegrating tablet TAKE 1 TABLET (8 MG TOTAL) BY MOUTH EVERY 8 (EIGHT) HOURS AS NEEDED FOR NAUSEA OR VOMITING. 11/21/20   Gatha Mayer, MD  sodium chloride 0.9 % infusion Inject 900 mLs into the vein every 14 (fourteen) days. Uses for TPN 10/27/17   [provider]  spironolactone (ALDACTONE) 25 MG tablet Take 0.5 tablets (12.5 mg total) by mouth daily. 12/18/20   Bensimhon, Shaune Pascal, MD  Teduglutide, rDNA, 5 MG KIT Inject 3.1 application into the skin every morning.    [provider]  triamcinolone cream (KENALOG) 0.1 % Apply 1 application topically as needed.    [provider]  vitamin E 1000 UNIT capsule Take 1,000 Units by mouth daily.    [provider]  XARELTO 20 MG TABS tablet TAKE 1 TABLET (20 MG TOTAL) BY MOUTH DAILY WITH SUPPER. 11/13/20   [name], MD     Family History  Problem Relation Age of Onset   Diabetes Mother    Hypertension Mother    AAA (abdominal aortic aneurysm) Mother    Colon cancer Neg Hx    Stomach cancer Neg Hx     Social History   Socioeconomic History   Marital status: Married    Spouse name: Not on file   Number of children: Not on file   Years of education: Not on file   Highest education level: Not on file  Occupational History   Not on file  Tobacco Use   Smoking status: Former    Types: Cigarettes    Quit date: 05/12/2004    Years since quitting: 16.7   Smokeless tobacco: Never  Vaping Use   Vaping Use: Never used  Substance and Sexual Activity   Alcohol use: No   Drug use: No   Sexual activity: Yes    Comment: 1st intercourse 41 yo-5 partners  Other Topics Concern   Not on file  Social History Narrative   Married to [place], has 1 daughter and a granddaughter the patient's son died in childhood due to a motor vehicle wreck   Disabled due to illness 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 infarction of  the mesentery   No alcohol tobacco or drug use   04/07/2017  Social Determinants of Health   Financial Resource Strain: Not on file  Food Insecurity: Not on file  Transportation Needs: No Transportation Needs   Lack of Transportation (Medical): No   Lack of Transportation (Non-Medical): No  Physical Activity: Not on file  Stress: Not on file  Social Connections: Not on file    ECOG Status: 1 - Symptomatic but completely ambulatory  Review of Systems  Review of Systems: A 12 point ROS discussed and pertinent positives are indicated in the HPI above.  All other systems are negative.  Physical Exam No direct physical exam was performed (except for noted visual exam findings with Video Visits).   Vital Signs: LMP 02/26/2014   Imaging: IR Venocavagram Svc  Result Date: 12/28/2020 INDICATION: History of short gut syndrome (secondary to ischemic bowel), with multiple previous tunneled central venous catheters for long-term TPN administration. Earlier this year the patient was admitted to the hospital with presumed catheter related bacteremia, subsequently undergoing catheter removal for purposes of a catheter holiday. 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 Since that time, patient has maintained intravenous access via a right common femoral approach central venous catheter Patient presents today for attempted central venous recanalization and potential jugular approach central venous catheter placement EXAM: 1. ULTRASOUND-GUIDED VENOUS ACCESS X3 (LEFT BRACHIAL VEIN, LEFT INTERNAL JUGULAR VEIN AND LEFT COMMON FEMORAL VEIN) 2. LEFT UPPER EXTREMITY CENTRAL VENOGRAM 3. FLUOROSCOPIC GUIDED RECANALIZATION LEFT SUBCLAVIAN, INNOMINATE AND SUPERIOR VENA CAVA OCCLUSION WITH ANGIOPLASTY AND STENT PLACEMENT. 4. BEDSIDE REMOVAL OF TUNNELED RIGHT COMMON FEMORAL VEIN  CENTRAL VENOUS CATHETER MEDICATIONS: Vancomycin 1 gm IV. The antibiotic was given in an appropriate time interval prior to skin puncture. ANESTHESIA/SEDATION: Moderate (conscious) sedation was employed during this procedure. A total of Versed 2 mg and Fentanyl 100 mcg was administered intravenously. Moderate Sedation Time: 126 minutes. The patient's level of consciousness and vital signs were monitored continuously by radiology nursing throughout the procedure under my direct supervision. FLUOROSCOPY TIME:  23 minutes, 18 seconds (947 mGy) COMPLICATIONS: None immediate. PROCEDURE: Informed written consent was obtained from the patient after a discussion of the risks, benefits, and alternatives to treatment. Questions regarding the procedure were encouraged and answered. The skin overlying the left groin, the left neck, chest and medial aspect of the left upper arm were prepped with chlorhexidine in a sterile fashion, and a sterile drape was applied covering the operative field. Maximum barrier sterile technique with sterile gowns and gloves were used for the procedure. A timeout was performed prior to the initiation of the procedure. Under direct ultrasound guidance, the mid/peripheral humeral aspect of the left basilic vein was accessed with a micropuncture kit. An ultrasound image was saved documenting patency of the left basilic vein. Limited left upper extremity venogram was performed through the outer sheath of the micropuncture demonstrating preferential flow from the left upper extremity through a hypertrophied left lateral chest wall venous collateral. Via the micropuncture sheath, a stiff glidewire was advanced to the level of the left subclavian vein. Next, under intermittent fluoroscopic guidance, the micropuncture sheath was exchanged for a 6 French, 35 cm radiopaque vascular sheath which was advanced to the level of the mid aspect the left subclavian vein. Central venogram was performed at this  location. Next, with the use of a stiff glidewire, a Kumpe catheter was advanced through the occluded segment of the central aspect of the left subclavian vein, the left brachiocephalic  vein and the SVC to the level of the right atrium. Contrast injection confirmed appropriate positioning. Next, the left internal jugular vein was accessed with a micropuncture kit after the overlying soft tissues were anesthetized with 1% lidocaine. Ultrasound image was saved documenting patency of the left internal jugular vein. This allowed for placement of a short Amplatz wire which was coiled within the mid aspect of the left subclavian vein. Next, the left common femoral vein was accessed with a micropuncture kit after the overlying soft tissues were anesthetized with 1% lidocaine. An ultrasound image was saved documenting patency of the left common femoral vein. This allowed for placement of a 7 French, 55 cm radiopaque vascular sheath which was advanced to the level of the right atrium under fluoroscopic guidance. Next, over an exchange length Amplatz wire, angioplasty was sequentially performed of the central venous system initially with a 4 mm x 4 cm mustang balloon, a 6 mm x 10 cm mustang balloon, and 8 mm x 10 cm mustang balloon from the left upper extremity vascular sheath access site. Multiple central venograms were performed following each angioplasty. Now with restored flow from the left upper extremity, a Kumpe catheter was utilized to obtain access into the left subclavian vein from the left common femoral approach vascular sheath. Next, over an exchange length Amplatz wire, angioplasty was performed of the occluded segment of the left upper extremity central venous system with a 10 mm x 4 cm mustang balloon from the left common femoral vein vascular access site. Multiple central venograms performed following each angioplasty. Next, over an exchange length Amplatz wire, a 10 mm x 10 cm Absolute Pro un covered self  expanding vascular stent was deployed across the occluded segment and angioplastied at multiple stations to 10 mm diameter. Central venograms were performed. Next, attention was paid towards placement of the tunneled left internal jugular approach central venous catheter. Via the existing left internal jugular approach access, a microwire was utilized to measure appropriate catheter length. The micropuncture sheath was exchanged for a peel-away sheath over a guidewire. A 5 French single lumen tunneled central venous catheter measuring 28 cm was tunneled in a retrograde fashion from the anterior chest wall to the venotomy incision. The catheter was then placed through the peel-away sheath with tip ultimately positioned at the superior caval-atrial junction. Final catheter positioning was confirmed and documented with a spot radiographic image. The catheter aspirates and flushes normally. The catheter was flushed with appropriate volume heparin dwells. The catheter exit site was secured with a 0-Prolene retention suture. The venotomy incision was closed with Dermabond and Steri-strips. Next, the retention suture was cut and the pre-existing right common femoral approach central venous catheter was removed intact. Dressings were applied. The patient tolerated the procedure well without immediate post procedural complication. FINDINGS: Initial left upper extremity central venogram demonstrates preferential flow from the left upper extremity through hypertrophied left lateral chest wall venous collateral. Central venogram performed from the left subclavian vein demonstrates occlusion the central aspect of the left subclavian vein with filling of multiple left chest wall hypertrophied venous collaterals. Following fluoroscopic guided recanalization there is restored flow through the left upper extremity central venous system however stent placement was performed in order to provide a more durable result. Completion  central venogram demonstrates restored patency of the left upper extremity central venous system without evidence of complication. Specifically, no evidence of contrast extravasation or vessel dissection. Following central venous catheter placement, the tunneled left internal jugular approach central venous  catheter tip terminates within the superior aspect of the right atrium. The catheter aspirates and flushes normally and is ready for immediate use. IMPRESSION: 1. 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 2. Successful placement of 28 cm single lumen tunneled central venous catheter via the left internal jugular vein with tip terminating at the superior caval atrial junction. The catheter is ready for immediate use. 3. Successful bedside removal of pre-existing right common femoral approach tunneled central venous catheter. PLAN: As before, given patient's poor central venous access, do NOT remove this central venous catheter left under the explicit direction of the interventional radiology service. Electronically Signed   By: Sandi Mariscal M.D.   On: 12/28/2020 13:36   IR Fluoro Guide CV Line Left  Result Date: 12/28/2020 INDICATION: History of short gut syndrome (secondary to ischemic bowel), with multiple previous tunneled central venous catheters for long-term TPN administration. Earlier this year the patient was admitted to the hospital with presumed catheter related bacteremia, subsequently undergoing catheter removal for purposes of a catheter holiday. 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 Since that time, patient has maintained intravenous access via a right common femoral approach central venous catheter Patient presents today for attempted  central venous recanalization and potential jugular approach central venous catheter placement EXAM: 1. ULTRASOUND-GUIDED VENOUS ACCESS X3 (LEFT BRACHIAL VEIN, LEFT INTERNAL JUGULAR VEIN AND LEFT COMMON FEMORAL VEIN) 2. LEFT UPPER EXTREMITY CENTRAL VENOGRAM 3. FLUOROSCOPIC GUIDED RECANALIZATION LEFT SUBCLAVIAN, INNOMINATE AND SUPERIOR VENA CAVA OCCLUSION WITH ANGIOPLASTY AND STENT PLACEMENT. 4. BEDSIDE REMOVAL OF TUNNELED RIGHT COMMON FEMORAL VEIN CENTRAL VENOUS CATHETER MEDICATIONS: Vancomycin 1 gm IV. The antibiotic was given in an appropriate time interval prior to skin puncture. ANESTHESIA/SEDATION: Moderate (conscious) sedation was employed during this procedure. A total of Versed 2 mg and Fentanyl 100 mcg was administered intravenously. Moderate Sedation Time: 126 minutes. The patient's level of consciousness and vital signs were monitored continuously by radiology nursing throughout the procedure under my direct supervision. FLUOROSCOPY TIME:  23 minutes, 18 seconds (818 mGy) COMPLICATIONS: None immediate. PROCEDURE: Informed written consent was obtained from the patient after a discussion of the risks, benefits, and alternatives to treatment. Questions regarding the procedure were encouraged and answered. The skin overlying the left groin, the left neck, chest and medial aspect of the left upper arm were prepped with chlorhexidine in a sterile fashion, and a sterile drape was applied covering the operative field. Maximum barrier sterile technique with sterile gowns and gloves were used for the procedure. A timeout was performed prior to the initiation of the procedure. Under direct ultrasound guidance, the mid/peripheral humeral aspect of the left basilic vein was accessed with a micropuncture kit. An ultrasound image was saved documenting patency of the left basilic vein. Limited left upper extremity venogram was performed through the outer sheath of the micropuncture demonstrating preferential flow from  the left upper extremity through a hypertrophied left lateral chest wall venous collateral. Via the micropuncture sheath, a stiff glidewire was advanced to the level of the left subclavian vein. Next, under intermittent fluoroscopic guidance, the micropuncture sheath was exchanged for a 6 French, 35 cm radiopaque vascular sheath which was advanced to the level of the mid aspect the left subclavian vein. Central venogram was performed at this location. Next, with the use of a stiff glidewire, a Kumpe catheter was  advanced through the occluded segment of the central aspect of the left subclavian vein, the left brachiocephalic vein and the SVC to the level of the right atrium. Contrast injection confirmed appropriate positioning. Next, the left internal jugular vein was accessed with a micropuncture kit after the overlying soft tissues were anesthetized with 1% lidocaine. Ultrasound image was saved documenting patency of the left internal jugular vein. This allowed for placement of a short Amplatz wire which was coiled within the mid aspect of the left subclavian vein. Next, the left common femoral vein was accessed with a micropuncture kit after the overlying soft tissues were anesthetized with 1% lidocaine. An ultrasound image was saved documenting patency of the left common femoral vein. This allowed for placement of a 7 French, 55 cm radiopaque vascular sheath which was advanced to the level of the right atrium under fluoroscopic guidance. Next, over an exchange length Amplatz wire, angioplasty was sequentially performed of the central venous system initially with a 4 mm x 4 cm mustang balloon, a 6 mm x 10 cm mustang balloon, and 8 mm x 10 cm mustang balloon from the left upper extremity vascular sheath access site. Multiple central venograms were performed following each angioplasty. Now with restored flow from the left upper extremity, a Kumpe catheter was utilized to obtain access into the left subclavian vein  from the left common femoral approach vascular sheath. Next, over an exchange length Amplatz wire, angioplasty was performed of the occluded segment of the left upper extremity central venous system with a 10 mm x 4 cm mustang balloon from the left common femoral vein vascular access site. Multiple central venograms performed following each angioplasty. Next, over an exchange length Amplatz wire, a 10 mm x 10 cm Absolute Pro un covered self expanding vascular stent was deployed across the occluded segment and angioplastied at multiple stations to 10 mm diameter. Central venograms were performed. Next, attention was paid towards placement of the tunneled left internal jugular approach central venous catheter. Via the existing left internal jugular approach access, a microwire was utilized to measure appropriate catheter length. The micropuncture sheath was exchanged for a peel-away sheath over a guidewire. A 5 French single lumen tunneled central venous catheter measuring 28 cm was tunneled in a retrograde fashion from the anterior chest wall to the venotomy incision. The catheter was then placed through the peel-away sheath with tip ultimately positioned at the superior caval-atrial junction. Final catheter positioning was confirmed and documented with a spot radiographic image. The catheter aspirates and flushes normally. The catheter was flushed with appropriate volume heparin dwells. The catheter exit site was secured with a 0-Prolene retention suture. The venotomy incision was closed with Dermabond and Steri-strips. Next, the retention suture was cut and the pre-existing right common femoral approach central venous catheter was removed intact. Dressings were applied. The patient tolerated the procedure well without immediate post procedural complication. FINDINGS: Initial left upper extremity central venogram demonstrates preferential flow from the left upper extremity through hypertrophied left lateral chest  wall venous collateral. Central venogram performed from the left subclavian vein demonstrates occlusion the central aspect of the left subclavian vein with filling of multiple left chest wall hypertrophied venous collaterals. Following fluoroscopic guided recanalization there is restored flow through the left upper extremity central venous system however stent placement was performed in order to provide a more durable result. Completion central venogram demonstrates restored patency of the left upper extremity central venous system without evidence of complication. Specifically, no evidence of contrast  extravasation or vessel dissection. Following central venous catheter placement, the tunneled left internal jugular approach central venous catheter tip terminates within the superior aspect of the right atrium. The catheter aspirates and flushes normally and is ready for immediate use. IMPRESSION: 1. 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 2. Successful placement of 28 cm single lumen tunneled central venous catheter via the left internal jugular vein with tip terminating at the superior caval atrial junction. The catheter is ready for immediate use. 3. Successful bedside removal of pre-existing right common femoral approach tunneled central venous catheter. PLAN: As before, given patient's poor central venous access, do NOT remove this central venous catheter left under the explicit direction of the interventional radiology service. Electronically Signed   By: Sandi Mariscal M.D.   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 Earlier this year the patient was admitted to the hospital with presumed  catheter related bacteremia, subsequently undergoing catheter removal for purposes of a catheter holiday. 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 Since that time, patient has maintained intravenous access via a right common femoral approach central venous catheter Patient presents today for attempted central venous recanalization and potential jugular approach central venous catheter placement EXAM: 1. ULTRASOUND-GUIDED VENOUS ACCESS X3 (LEFT BRACHIAL VEIN, LEFT INTERNAL JUGULAR VEIN AND LEFT COMMON FEMORAL VEIN) 2. LEFT UPPER EXTREMITY CENTRAL VENOGRAM 3. FLUOROSCOPIC GUIDED RECANALIZATION LEFT SUBCLAVIAN, INNOMINATE AND SUPERIOR VENA CAVA OCCLUSION WITH ANGIOPLASTY AND STENT PLACEMENT. 4. BEDSIDE REMOVAL OF TUNNELED RIGHT COMMON FEMORAL VEIN CENTRAL VENOUS CATHETER MEDICATIONS: Vancomycin 1 gm IV. The antibiotic was given in an appropriate time interval prior to skin puncture. ANESTHESIA/SEDATION: Moderate (conscious) sedation was employed during this procedure. A total of Versed 2 mg and Fentanyl 100 mcg was administered intravenously. Moderate Sedation Time: 126 minutes. The patient's level of consciousness and vital signs were monitored continuously by radiology nursing throughout the procedure under my direct supervision. FLUOROSCOPY TIME:  23 minutes, 18 seconds (678 mGy) COMPLICATIONS: None immediate. PROCEDURE: Informed written consent was obtained from the patient after a discussion of the risks, benefits, and alternatives to treatment. Questions regarding the procedure were encouraged and answered. The skin overlying the left groin, the left neck, chest and medial aspect of the left upper arm were prepped with chlorhexidine in a sterile fashion, and a sterile drape was applied covering the operative field. Maximum barrier sterile technique with sterile gowns and  gloves were used for the procedure. A timeout was performed prior to the initiation of the procedure. Under direct ultrasound guidance, the mid/peripheral humeral aspect of the left basilic vein was accessed with a micropuncture kit. An ultrasound image was saved documenting patency of the left basilic vein. Limited left upper extremity venogram was performed through the outer sheath of the micropuncture demonstrating preferential flow from the left upper extremity through a hypertrophied left lateral chest wall venous collateral. Via the micropuncture sheath, a stiff glidewire was advanced to the level of the left subclavian vein. Next, under intermittent fluoroscopic guidance, the micropuncture sheath was exchanged for a 6 French, 35 cm radiopaque vascular sheath which was advanced to the level of the mid aspect the left subclavian vein. Central  venogram was performed at this location. Next, with the use of a stiff glidewire, a Kumpe catheter was advanced through the occluded segment of the central aspect of the left subclavian vein, the left brachiocephalic vein and the SVC to the level of the right atrium. Contrast injection confirmed appropriate positioning. Next, the left internal jugular vein was accessed with a micropuncture kit after the overlying soft tissues were anesthetized with 1% lidocaine. Ultrasound image was saved documenting patency of the left internal jugular vein. This allowed for placement of a short Amplatz wire which was coiled within the mid aspect of the left subclavian vein. Next, the left common femoral vein was accessed with a micropuncture kit after the overlying soft tissues were anesthetized with 1% lidocaine. An ultrasound image was saved documenting patency of the left common femoral vein. This allowed for placement of a 7 French, 55 cm radiopaque vascular sheath which was advanced to the level of the right atrium under fluoroscopic guidance. Next, over an exchange length Amplatz  wire, angioplasty was sequentially performed of the central venous system initially with a 4 mm x 4 cm mustang balloon, a 6 mm x 10 cm mustang balloon, and 8 mm x 10 cm mustang balloon from the left upper extremity vascular sheath access site. Multiple central venograms were performed following each angioplasty. Now with restored flow from the left upper extremity, a Kumpe catheter was utilized to obtain access into the left subclavian vein from the left common femoral approach vascular sheath. Next, over an exchange length Amplatz wire, angioplasty was performed of the occluded segment of the left upper extremity central venous system with a 10 mm x 4 cm mustang balloon from the left common femoral vein vascular access site. Multiple central venograms performed following each angioplasty. Next, over an exchange length Amplatz wire, a 10 mm x 10 cm Absolute Pro un covered self expanding vascular stent was deployed across the occluded segment and angioplastied at multiple stations to 10 mm diameter. Central venograms were performed. Next, attention was paid towards placement of the tunneled left internal jugular approach central venous catheter. Via the existing left internal jugular approach access, a microwire was utilized to measure appropriate catheter length. The micropuncture sheath was exchanged for a peel-away sheath over a guidewire. A 5 French single lumen tunneled central venous catheter measuring 28 cm was tunneled in a retrograde fashion from the anterior chest wall to the venotomy incision. The catheter was then placed through the peel-away sheath with tip ultimately positioned at the superior caval-atrial junction. Final catheter positioning was confirmed and documented with a spot radiographic image. The catheter aspirates and flushes normally. The catheter was flushed with appropriate volume heparin dwells. The catheter exit site was secured with a 0-Prolene retention suture. The venotomy incision  was closed with Dermabond and Steri-strips. Next, the retention suture was cut and the pre-existing right common femoral approach central venous catheter was removed intact. Dressings were applied. The patient tolerated the procedure well without immediate post procedural complication. FINDINGS: Initial left upper extremity central venogram demonstrates preferential flow from the left upper extremity through hypertrophied left lateral chest wall venous collateral. Central venogram performed from the left subclavian vein demonstrates occlusion the central aspect of the left subclavian vein with filling of multiple left chest wall hypertrophied venous collaterals. Following fluoroscopic guided recanalization there is restored flow through the left upper extremity central venous system however stent placement was performed in order to provide a more durable result. Completion central venogram demonstrates restored  patency of the left upper extremity central venous system without evidence of complication. Specifically, no evidence of contrast extravasation or vessel dissection. Following central venous catheter placement, the tunneled left internal jugular approach central venous catheter tip terminates within the superior aspect of the right atrium. The catheter aspirates and flushes normally and is ready for immediate use. IMPRESSION: 1. 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 2. Successful placement of 28 cm single lumen tunneled central venous catheter via the left internal jugular vein with tip terminating at the superior caval atrial junction. The catheter is ready for immediate use. 3. Successful bedside removal of pre-existing right common femoral approach tunneled central venous catheter. PLAN: As before, given patient's poor central venous access, do NOT remove this central venous catheter  left under the explicit direction of the interventional radiology service. Electronically Signed   By: Sandi Mariscal M.D.   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 Earlier this year the patient was admitted to the hospital with presumed catheter related bacteremia, subsequently undergoing catheter removal for purposes of a catheter holiday. 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 Since that time, patient has maintained intravenous access via a right common femoral approach central venous catheter Patient presents today for attempted central venous recanalization and potential jugular approach central venous catheter placement EXAM: 1. ULTRASOUND-GUIDED VENOUS ACCESS X3 (LEFT BRACHIAL VEIN, LEFT INTERNAL JUGULAR VEIN AND LEFT COMMON FEMORAL VEIN) 2. LEFT UPPER EXTREMITY CENTRAL VENOGRAM 3. FLUOROSCOPIC GUIDED RECANALIZATION LEFT SUBCLAVIAN, INNOMINATE AND SUPERIOR VENA CAVA OCCLUSION WITH ANGIOPLASTY AND STENT PLACEMENT. 4. BEDSIDE REMOVAL OF TUNNELED RIGHT COMMON FEMORAL VEIN CENTRAL VENOUS CATHETER MEDICATIONS: Vancomycin 1 gm IV. The antibiotic was given in an appropriate time interval prior to skin puncture. ANESTHESIA/SEDATION: Moderate (conscious) sedation was employed during this procedure. A total of Versed 2 mg and Fentanyl 100 mcg was administered intravenously. Moderate Sedation Time: 126 minutes. The patient's level of consciousness and vital signs were monitored continuously by radiology nursing throughout the procedure under my direct supervision. FLUOROSCOPY TIME:  23 minutes, 18 seconds (979 mGy) COMPLICATIONS: None immediate. PROCEDURE: Informed written consent  was obtained from the patient after a discussion of the risks, benefits, and alternatives to treatment. Questions regarding the procedure were encouraged and answered. The skin overlying the left groin, the left neck, chest and medial aspect of the left upper arm were prepped with chlorhexidine in a sterile fashion, and a sterile drape was applied covering the operative field. Maximum barrier sterile technique with sterile gowns and gloves were used for the procedure. A timeout was performed prior to the initiation of the procedure. Under direct ultrasound guidance, the mid/peripheral humeral aspect of the left basilic vein was accessed with a micropuncture kit. An ultrasound image was saved documenting patency of the left basilic vein. Limited left upper extremity venogram was performed through the outer sheath of the micropuncture demonstrating preferential flow from the left upper extremity through a hypertrophied left lateral chest wall venous collateral. Via the micropuncture sheath, a stiff glidewire was advanced to the level of the left subclavian vein. Next, under intermittent fluoroscopic guidance, the micropuncture sheath was exchanged for a 6 [place], 35 cm radiopaque  vascular sheath which was advanced to the level of the mid aspect the left subclavian vein. Central venogram was performed at this location. Next, with the use of a stiff glidewire, a Kumpe catheter was advanced through the occluded segment of the central aspect of the left subclavian vein, the left brachiocephalic vein and the SVC to the level of the right atrium. Contrast injection confirmed appropriate positioning. Next, the left internal jugular vein was accessed with a micropuncture kit after the overlying soft tissues were anesthetized with 1% lidocaine. Ultrasound image was saved documenting patency of the left internal jugular vein. This allowed for placement of a short Amplatz wire which was coiled within the mid aspect of the left  subclavian vein. Next, the left common femoral vein was accessed with a micropuncture kit after the overlying soft tissues were anesthetized with 1% lidocaine. An ultrasound image was saved documenting patency of the left common femoral vein. This allowed for placement of a 7 French, 55 cm radiopaque vascular sheath which was advanced to the level of the right atrium under fluoroscopic guidance. Next, over an exchange length Amplatz wire, angioplasty was sequentially performed of the central venous system initially with a 4 mm x 4 cm mustang balloon, a 6 mm x 10 cm mustang balloon, and 8 mm x 10 cm mustang balloon from the left upper extremity vascular sheath access site. Multiple central venograms were performed following each angioplasty. Now with restored flow from the left upper extremity, a Kumpe catheter was utilized to obtain access into the left subclavian vein from the left common femoral approach vascular sheath. Next, over an exchange length Amplatz wire, angioplasty was performed of the occluded segment of the left upper extremity central venous system with a 10 mm x 4 cm mustang balloon from the left common femoral vein vascular access site. Multiple central venograms performed following each angioplasty. Next, over an exchange length Amplatz wire, a 10 mm x 10 cm Absolute Pro un covered self expanding vascular stent was deployed across the occluded segment and angioplastied at multiple stations to 10 mm diameter. Central venograms were performed. Next, attention was paid towards placement of the tunneled left internal jugular approach central venous catheter. Via the existing left internal jugular approach access, a microwire was utilized to measure appropriate catheter length. The micropuncture sheath was exchanged for a peel-away sheath over a guidewire. A 5 French single lumen tunneled central venous catheter measuring 28 cm was tunneled in a retrograde fashion from the anterior chest wall to the  venotomy incision. The catheter was then placed through the peel-away sheath with tip ultimately positioned at the superior caval-atrial junction. Final catheter positioning was confirmed and documented with a spot radiographic image. The catheter aspirates and flushes normally. The catheter was flushed with appropriate volume heparin dwells. The catheter exit site was secured with a 0-Prolene retention suture. The venotomy incision was closed with Dermabond and Steri-strips. Next, the retention suture was cut and the pre-existing right common femoral approach central venous catheter was removed intact. Dressings were applied. The patient tolerated the procedure well without immediate post procedural complication. FINDINGS: Initial left upper extremity central venogram demonstrates preferential flow from the left upper extremity through hypertrophied left lateral chest wall venous collateral. Central venogram performed from the left subclavian vein demonstrates occlusion the central aspect of the left subclavian vein with filling of multiple left chest wall hypertrophied venous collaterals. Following fluoroscopic guided recanalization there is restored flow through the left upper extremity central venous system however  stent placement was performed in order to provide a more durable result. Completion central venogram demonstrates restored patency of the left upper extremity central venous system without evidence of complication. Specifically, no evidence of contrast extravasation or vessel dissection. Following central venous catheter placement, the tunneled left internal jugular approach central venous catheter tip terminates within the superior aspect of the right atrium. The catheter aspirates and flushes normally and is ready for immediate use. IMPRESSION: 1. Successful fluoroscopic guided recanalization of occluded segments of the left subclavian, innominate and superior vena cava requiring stent assisted  angioplasty. 2. Successful placement of 28 cm single lumen tunneled central venous catheter via the left internal jugular vein with tip terminating at the superior caval atrial junction. The catheter is ready for immediate use. 3. Successful bedside removal of pre-existing right common femoral approach tunneled central venous catheter. PLAN: As before, given patient's poor central venous access, do NOT remove this central venous catheter left under the explicit direction of the interventional radiology service. Electronically Signed   By: Sandi Mariscal M.D.   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 Earlier this year the patient was admitted to the hospital with presumed catheter related bacteremia, subsequently undergoing catheter removal for purposes of a catheter holiday. 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 Since that time, patient has maintained intravenous access via a right common femoral approach central venous catheter Patient presents today for attempted central venous recanalization and potential jugular approach central venous catheter placement EXAM: 1. ULTRASOUND-GUIDED VENOUS ACCESS X3 (LEFT BRACHIAL VEIN, LEFT INTERNAL JUGULAR VEIN AND LEFT COMMON FEMORAL VEIN) 2. LEFT UPPER EXTREMITY CENTRAL VENOGRAM 3. FLUOROSCOPIC GUIDED RECANALIZATION LEFT SUBCLAVIAN, INNOMINATE AND SUPERIOR VENA CAVA OCCLUSION WITH ANGIOPLASTY AND STENT PLACEMENT. 4. BEDSIDE REMOVAL OF TUNNELED RIGHT COMMON FEMORAL VEIN CENTRAL VENOUS CATHETER MEDICATIONS: Vancomycin 1 gm IV. The antibiotic was given in an appropriate time interval prior to skin puncture. ANESTHESIA/SEDATION: Moderate  (conscious) sedation was employed during this procedure. A total of Versed 2 mg and Fentanyl 100 mcg was administered intravenously. Moderate Sedation Time: 126 minutes. The patient's level of consciousness and vital signs were monitored continuously by radiology nursing throughout the procedure under my direct supervision. FLUOROSCOPY TIME:  23 minutes, 18 seconds (947 mGy) COMPLICATIONS: None immediate. PROCEDURE: Informed written consent was obtained from the patient after a discussion of the risks, benefits, and alternatives to treatment. Questions regarding the procedure were encouraged and answered. The skin overlying the left groin, the left neck, chest and medial aspect of the left upper arm were prepped with chlorhexidine in a sterile fashion, and a sterile drape was applied covering the operative field. Maximum barrier sterile technique with sterile gowns and gloves were used for the procedure. A timeout was performed prior to the initiation of the procedure. Under direct ultrasound guidance, the mid/peripheral humeral aspect of the left basilic vein was accessed with a micropuncture kit. An ultrasound image was saved documenting patency of the left basilic vein. Limited left upper extremity venogram was performed through the outer sheath of the micropuncture demonstrating preferential flow from the left upper extremity through a hypertrophied left lateral chest wall venous collateral. Via the micropuncture sheath, a stiff glidewire was advanced to the level of the left subclavian vein.  Next, under intermittent fluoroscopic guidance, the micropuncture sheath was exchanged for a 6 French, 35 cm radiopaque vascular sheath which was advanced to the level of the mid aspect the left subclavian vein. Central venogram was performed at this location. Next, with the use of a stiff glidewire, a Kumpe catheter was advanced through the occluded segment of the central aspect of the left subclavian vein, the left  brachiocephalic vein and the SVC to the level of the right atrium. Contrast injection confirmed appropriate positioning. Next, the left internal jugular vein was accessed with a micropuncture kit after the overlying soft tissues were anesthetized with 1% lidocaine. Ultrasound image was saved documenting patency of the left internal jugular vein. This allowed for placement of a short Amplatz wire which was coiled within the mid aspect of the left subclavian vein. Next, the left common femoral vein was accessed with a micropuncture kit after the overlying soft tissues were anesthetized with 1% lidocaine. An ultrasound image was saved documenting patency of the left common femoral vein. This allowed for placement of a 7 French, 55 cm radiopaque vascular sheath which was advanced to the level of the right atrium under fluoroscopic guidance. Next, over an exchange length Amplatz wire, angioplasty was sequentially performed of the central venous system initially with a 4 mm x 4 cm mustang balloon, a 6 mm x 10 cm mustang balloon, and 8 mm x 10 cm mustang balloon from the left upper extremity vascular sheath access site. Multiple central venograms were performed following each angioplasty. Now with restored flow from the left upper extremity, a Kumpe catheter was utilized to obtain access into the left subclavian vein from the left common femoral approach vascular sheath. Next, over an exchange length Amplatz wire, angioplasty was performed of the occluded segment of the left upper extremity central venous system with a 10 mm x 4 cm mustang balloon from the left common femoral vein vascular access site. Multiple central venograms performed following each angioplasty. Next, over an exchange length Amplatz wire, a 10 mm x 10 cm Absolute Pro un covered self expanding vascular stent was deployed across the occluded segment and angioplastied at multiple stations to 10 mm diameter. Central venograms were performed. Next,  attention was paid towards placement of the tunneled left internal jugular approach central venous catheter. Via the existing left internal jugular approach access, a microwire was utilized to measure appropriate catheter length. The micropuncture sheath was exchanged for a peel-away sheath over a guidewire. A 5 French single lumen tunneled central venous catheter measuring 28 cm was tunneled in a retrograde fashion from the anterior chest wall to the venotomy incision. The catheter was then placed through the peel-away sheath with tip ultimately positioned at the superior caval-atrial junction. Final catheter positioning was confirmed and documented with a spot radiographic image. The catheter aspirates and flushes normally. The catheter was flushed with appropriate volume heparin dwells. The catheter exit site was secured with a 0-Prolene retention suture. The venotomy incision was closed with Dermabond and Steri-strips. Next, the retention suture was cut and the pre-existing right common femoral approach central venous catheter was removed intact. Dressings were applied. The patient tolerated the procedure well without immediate post procedural complication. FINDINGS: Initial left upper extremity central venogram demonstrates preferential flow from the left upper extremity through hypertrophied left lateral chest wall venous collateral. Central venogram performed from the left subclavian vein demonstrates occlusion the central aspect of the left subclavian vein with filling of multiple left chest wall hypertrophied venous collaterals.  Following fluoroscopic guided recanalization there is restored flow through the left upper extremity central venous system however stent placement was performed in order to provide a more durable result. Completion central venogram demonstrates restored patency of the left upper extremity central venous system without evidence of complication. Specifically, no evidence of contrast  extravasation or vessel dissection. Following central venous catheter placement, the tunneled left internal jugular approach central venous catheter tip terminates within the superior aspect of the right atrium. The catheter aspirates and flushes normally and is ready for immediate use. IMPRESSION: 1. 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 2. Successful placement of 28 cm single lumen tunneled central venous catheter via the left internal jugular vein with tip terminating at the superior caval atrial junction. The catheter is ready for immediate use. 3. Successful bedside removal of pre-existing right common femoral approach tunneled central venous catheter. PLAN: As before, given patient's poor central venous access, do NOT remove this central venous catheter left under the explicit direction of the interventional radiology service. Electronically Signed   By: Sandi Mariscal M.D.   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 Earlier this year the patient was admitted to the hospital with presumed catheter related bacteremia, subsequently undergoing catheter removal for purposes of a catheter holiday. 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 Since that time, patient has maintained intravenous access via a right common femoral approach central venous catheter Patient presents today for attempted central venous recanalization and potential jugular approach central venous catheter placement EXAM: 1. ULTRASOUND-GUIDED VENOUS ACCESS X3 (LEFT BRACHIAL VEIN, LEFT  INTERNAL JUGULAR VEIN AND LEFT COMMON FEMORAL VEIN) 2. LEFT UPPER EXTREMITY CENTRAL VENOGRAM 3. FLUOROSCOPIC GUIDED RECANALIZATION LEFT SUBCLAVIAN, INNOMINATE AND SUPERIOR VENA CAVA OCCLUSION WITH ANGIOPLASTY AND STENT PLACEMENT. 4. BEDSIDE REMOVAL OF TUNNELED RIGHT COMMON FEMORAL VEIN CENTRAL VENOUS CATHETER MEDICATIONS: Vancomycin 1 gm IV. The antibiotic was given in an appropriate time interval prior to skin puncture. ANESTHESIA/SEDATION: Moderate (conscious) sedation was employed during this procedure. A total of Versed 2 mg and Fentanyl 100 mcg was administered intravenously. Moderate Sedation Time: 126 minutes. The patient's level of consciousness and vital signs were monitored continuously by radiology nursing throughout the procedure under my direct supervision. FLUOROSCOPY TIME:  23 minutes, 18 seconds (409 mGy) COMPLICATIONS: None immediate. PROCEDURE: Informed written consent was obtained from the patient after a discussion of the risks, benefits, and alternatives to treatment. Questions regarding the procedure were encouraged and answered. The skin overlying the left groin, the left neck, chest and medial aspect of the left upper arm were prepped with chlorhexidine in a sterile fashion, and a sterile drape was applied covering the operative field. Maximum barrier sterile technique with sterile gowns and gloves were used for the procedure. A timeout was performed prior to the initiation of the procedure. Under direct ultrasound guidance, the mid/peripheral humeral aspect of the left basilic vein was accessed with a micropuncture kit. An ultrasound image was saved documenting patency of the left basilic vein. Limited left upper extremity venogram was performed through the outer sheath of the micropuncture demonstrating preferential flow from the left upper extremity through a hypertrophied left lateral chest wall venous collateral.  Via the micropuncture sheath, a stiff glidewire was advanced to the level  of the left subclavian vein. Next, under intermittent fluoroscopic guidance, the micropuncture sheath was exchanged for a 6 French, 35 cm radiopaque vascular sheath which was advanced to the level of the mid aspect the left subclavian vein. Central venogram was performed at this location. Next, with the use of a stiff glidewire, a Kumpe catheter was advanced through the occluded segment of the central aspect of the left subclavian vein, the left brachiocephalic vein and the SVC to the level of the right atrium. Contrast injection confirmed appropriate positioning. Next, the left internal jugular vein was accessed with a micropuncture kit after the overlying soft tissues were anesthetized with 1% lidocaine. Ultrasound image was saved documenting patency of the left internal jugular vein. This allowed for placement of a short Amplatz wire which was coiled within the mid aspect of the left subclavian vein. Next, the left common femoral vein was accessed with a micropuncture kit after the overlying soft tissues were anesthetized with 1% lidocaine. An ultrasound image was saved documenting patency of the left common femoral vein. This allowed for placement of a 7 French, 55 cm radiopaque vascular sheath which was advanced to the level of the right atrium under fluoroscopic guidance. Next, over an exchange length Amplatz wire, angioplasty was sequentially performed of the central venous system initially with a 4 mm x 4 cm mustang balloon, a 6 mm x 10 cm mustang balloon, and 8 mm x 10 cm mustang balloon from the left upper extremity vascular sheath access site. Multiple central venograms were performed following each angioplasty. Now with restored flow from the left upper extremity, a Kumpe catheter was utilized to obtain access into the left subclavian vein from the left common femoral approach vascular sheath. Next, over an exchange length Amplatz wire, angioplasty was performed of the occluded segment of the left upper  extremity central venous system with a 10 mm x 4 cm mustang balloon from the left common femoral vein vascular access site. Multiple central venograms performed following each angioplasty. Next, over an exchange length Amplatz wire, a 10 mm x 10 cm Absolute Pro un covered self expanding vascular stent was deployed across the occluded segment and angioplastied at multiple stations to 10 mm diameter. Central venograms were performed. Next, attention was paid towards placement of the tunneled left internal jugular approach central venous catheter. Via the existing left internal jugular approach access, a microwire was utilized to measure appropriate catheter length. The micropuncture sheath was exchanged for a peel-away sheath over a guidewire. A 5 French single lumen tunneled central venous catheter measuring 28 cm was tunneled in a retrograde fashion from the anterior chest wall to the venotomy incision. The catheter was then placed through the peel-away sheath with tip ultimately positioned at the superior caval-atrial junction. Final catheter positioning was confirmed and documented with a spot radiographic image. The catheter aspirates and flushes normally. The catheter was flushed with appropriate volume heparin dwells. The catheter exit site was secured with a 0-Prolene retention suture. The venotomy incision was closed with Dermabond and Steri-strips. Next, the retention suture was cut and the pre-existing right common femoral approach central venous catheter was removed intact. Dressings were applied. The patient tolerated the procedure well without immediate post procedural complication. FINDINGS: Initial left upper extremity central venogram demonstrates preferential flow from the left upper extremity through hypertrophied left lateral chest wall venous collateral. Central venogram performed from the left subclavian vein demonstrates occlusion the  central aspect of the left subclavian vein with filling of  multiple left chest wall hypertrophied venous collaterals. Following fluoroscopic guided recanalization there is restored flow through the left upper extremity central venous system however stent placement was performed in order to provide a more durable result. Completion central venogram demonstrates restored patency of the left upper extremity central venous system without evidence of complication. Specifically, no evidence of contrast extravasation or vessel dissection. Following central venous catheter placement, the tunneled left internal jugular approach central venous catheter tip terminates within the superior aspect of the right atrium. The catheter aspirates and flushes normally and is ready for immediate use. IMPRESSION: 1. 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 2. Successful placement of 28 cm single lumen tunneled central venous catheter via the left internal jugular vein with tip terminating at the superior caval atrial junction. The catheter is ready for immediate use. 3. Successful bedside removal of pre-existing right common femoral approach tunneled central venous catheter. PLAN: As before, given patient's poor central venous access, do NOT remove this central venous catheter left under the explicit direction of the interventional radiology service. Electronically Signed   By: Sandi Mariscal M.D.   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 Earlier this year the patient was admitted to the hospital with presumed catheter related bacteremia, subsequently undergoing catheter removal for purposes of a catheter holiday. Unfortunately, attempted left internal  jugular approach central venous catheter placement demonstrated complete occlusion of the central venous system of the left upper extremity. Since that time, patient has maintained intravenous access via a right common femoral approach central venous catheter Patient presents today for attempted central venous recanalization and potential jugular approach central venous catheter placement EXAM: 1. ULTRASOUND-GUIDED VENOUS ACCESS X3 (LEFT BRACHIAL VEIN, LEFT INTERNAL JUGULAR VEIN AND LEFT COMMON FEMORAL VEIN) 2. LEFT UPPER EXTREMITY CENTRAL VENOGRAM 3. FLUOROSCOPIC GUIDED RECANALIZATION LEFT SUBCLAVIAN, INNOMINATE AND SUPERIOR VENA CAVA OCCLUSION WITH ANGIOPLASTY AND STENT PLACEMENT. 4. BEDSIDE REMOVAL OF TUNNELED RIGHT COMMON FEMORAL VEIN CENTRAL VENOUS CATHETER MEDICATIONS: Vancomycin 1 gm IV. The antibiotic was given in an appropriate time interval prior to skin puncture. ANESTHESIA/SEDATION: Moderate (conscious) sedation was employed during this procedure. A total of Versed 2 mg and Fentanyl 100 mcg was administered intravenously. Moderate Sedation Time: 126 minutes. The patient's level of consciousness and vital signs were monitored continuously by radiology nursing throughout the procedure under my direct supervision. FLUOROSCOPY TIME:  23 minutes, 18 seconds (250 mGy) COMPLICATIONS: None immediate. PROCEDURE: Informed written consent was obtained from the patient after a discussion of the risks, benefits, and alternatives to treatment. Questions regarding the procedure were encouraged and answered. The skin overlying the left groin, the left neck, chest and medial aspect of the left upper arm were prepped with chlorhexidine in a sterile fashion, and a sterile drape was applied covering the operative field. Maximum barrier sterile technique with sterile gowns and gloves were used for the procedure. A timeout was performed prior to the initiation of the procedure. Under direct ultrasound guidance, the  mid/peripheral humeral aspect of the left basilic vein was accessed with a micropuncture kit. An ultrasound image was saved documenting patency of the left basilic vein. Limited left upper extremity venogram was performed through the  outer sheath of the micropuncture demonstrating preferential flow from the left upper extremity through a hypertrophied left lateral chest wall venous collateral. Via the micropuncture sheath, a stiff glidewire was advanced to the level of the left subclavian vein. Next, under intermittent fluoroscopic guidance, the micropuncture sheath was exchanged for a 6 French, 35 cm radiopaque vascular sheath which was advanced to the level of the mid aspect the left subclavian vein. Central venogram was performed at this location. Next, with the use of a stiff glidewire, a Kumpe catheter was advanced through the occluded segment of the central aspect of the left subclavian vein, the left brachiocephalic vein and the SVC to the level of the right atrium. Contrast injection confirmed appropriate positioning. Next, the left internal jugular vein was accessed with a micropuncture kit after the overlying soft tissues were anesthetized with 1% lidocaine. Ultrasound image was saved documenting patency of the left internal jugular vein. This allowed for placement of a short Amplatz wire which was coiled within the mid aspect of the left subclavian vein. Next, the left common femoral vein was accessed with a micropuncture kit after the overlying soft tissues were anesthetized with 1% lidocaine. An ultrasound image was saved documenting patency of the left common femoral vein. This allowed for placement of a 7 French, 55 cm radiopaque vascular sheath which was advanced to the level of the right atrium under fluoroscopic guidance. Next, over an exchange length Amplatz wire, angioplasty was sequentially performed of the central venous system initially with a 4 mm x 4 cm mustang balloon, a 6 mm x 10 cm  mustang balloon, and 8 mm x 10 cm mustang balloon from the left upper extremity vascular sheath access site. Multiple central venograms were performed following each angioplasty. Now with restored flow from the left upper extremity, a Kumpe catheter was utilized to obtain access into the left subclavian vein from the left common femoral approach vascular sheath. Next, over an exchange length Amplatz wire, angioplasty was performed of the occluded segment of the left upper extremity central venous system with a 10 mm x 4 cm mustang balloon from the left common femoral vein vascular access site. Multiple central venograms performed following each angioplasty. Next, over an exchange length Amplatz wire, a 10 mm x 10 cm Absolute Pro un covered self expanding vascular stent was deployed across the occluded segment and angioplastied at multiple stations to 10 mm diameter. Central venograms were performed. Next, attention was paid towards placement of the tunneled left internal jugular approach central venous catheter. Via the existing left internal jugular approach access, a microwire was utilized to measure appropriate catheter length. The micropuncture sheath was exchanged for a peel-away sheath over a guidewire. A 5 French single lumen tunneled central venous catheter measuring 28 cm was tunneled in a retrograde fashion from the anterior chest wall to the venotomy incision. The catheter was then placed through the peel-away sheath with tip ultimately positioned at the superior caval-atrial junction. Final catheter positioning was confirmed and documented with a spot radiographic image. The catheter aspirates and flushes normally. The catheter was flushed with appropriate volume heparin dwells. The catheter exit site was secured with a 0-Prolene retention suture. The venotomy incision was closed with Dermabond and Steri-strips. Next, the retention suture was cut and the pre-existing right common femoral approach central  venous catheter was removed intact. Dressings were applied. The patient tolerated the procedure well without immediate post procedural complication. FINDINGS: Initial left upper extremity central venogram demonstrates preferential flow from the  left upper extremity through hypertrophied left lateral chest wall venous collateral. Central venogram performed from the left subclavian vein demonstrates occlusion the central aspect of the left subclavian vein with filling of multiple left chest wall hypertrophied venous collaterals. Following fluoroscopic guided recanalization there is restored flow through the left upper extremity central venous system however stent placement was performed in order to provide a more durable result. Completion central venogram demonstrates restored patency of the left upper extremity central venous system without evidence of complication. Specifically, no evidence of contrast extravasation or vessel dissection. Following central venous catheter placement, the tunneled left internal jugular approach central venous catheter tip terminates within the superior aspect of the right atrium. The catheter aspirates and flushes normally and is ready for immediate use. IMPRESSION: 1. 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 2. Successful placement of 28 cm single lumen tunneled central venous catheter via the left internal jugular vein with tip terminating at the superior caval atrial junction. The catheter is ready for immediate use. 3. Successful bedside removal of pre-existing right common femoral approach tunneled central venous catheter. PLAN: As before, given patient's poor central venous access, do NOT remove this central venous catheter left under the explicit direction of the interventional radiology service. Electronically Signed   By: Sandi Mariscal M.D.   On: 12/28/2020  13:36    Labs:  CBC: Recent Labs    10/30/20 0429 11/01/20 0409 12/07/20 1153 12/19/20 0901  WBC 5.3 6.7 6.4 9.1  HGB 8.7* 9.8* 8.9* 10.7*  HCT 30.7* 32.7* 27.0* 33.7*  PLT 84* 104* 93* 98*    COAGS: Recent Labs    07/23/20 1920  INR 1.8*  APTT 35    BMP: Recent Labs    10/28/20 0447 10/29/20 0447 11/01/20 0409 12/19/20 0901 12/25/20 1040 01/04/21 0913  NA 137 138 139 137 137 138  K 3.4* 3.8 3.1* 2.3* 3.3* 3.4*  CL 113* 117* 119* 115* 109 112  CO2 18* 17* 17* 11* 21 20  GLUCOSE 74 78 75 55* 98 95  BUN 23 13 7* 39* 49* 39*  CALCIUM 7.8* 8.1* 8.0* 8.1* 8.3* 8.1*  CREATININE 1.31* 1.12* 1.23* 2.04* 1.40* 1.19  GFRNONAA 44* 53* 47* 26*  --   --     LIVER FUNCTION TESTS: Recent Labs    10/27/20 0947 10/28/20 0447 10/29/20 0447 12/19/20 0901  BILITOT 0.6 0.3 0.5 0.4  AST $Re'19 18 18 19  'PVa$ ALT $R'16 16 14 18  'uv$ ALKPHOS 89 77 87 110  PROT 6.9 5.9* 6.4* 7.1  ALBUMIN 2.8* 2.3* 2.5* 2.9*    TUMOR MARKERS: No results for input(s): AFPTM, CEA, CA199, CHROMGRNA in the last 8760 hours.  Assessment and Plan:  Kennedee C Busser is a 70 y.o. 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  Review of procedural images from central venous recanalization and central venous catheter placement performed 12/28/2020 demonstrate a technically excellent result with successful recanalization of an occluded left subclavian and innominate veins as well as the superior vena cava with stent assisted  angioplasty.  The patient is currently without catheter or proceed related complaint and states the catheter is functioning well without incident.  The patient knows to call the interventional radiology clinic with any catheter related concerns.  She is aware that the central venous catheter should ONLY be removed at the discretion of the interventional radiology service.    Additionally, a portion of the central venous catheter courses through the interstices of the central venous stent and as such, catheter removal should ONLY be performed under fluoroscopic guidance.  Patient demonstrated excellent understanding of this conversation and is agreement with the plan of care..  Plan: Follow-up at the interventional radiology clinic on a PRN basis or with any catheter related concerns.   A copy of this report was sent to the requesting provider on this date.  Electronically Signed: Sandi Mariscal 01/24/2021, 8:23 AM   I spent a total of 10 Minutes in remote  clinical consultation, greater than 50% of which was counseling/coordinating care for central venous recanalization and central venous catheter placement.    Visit type: Audio only (telephone). Audio (no video) only due to patient's lack of internet/smartphone capability. Alternative for in-person consultation at [facility], [address], [place], [place]. This visit type was conducted due to national recommendations for restrictions regarding the COVID-19 Pandemic (e.g. social distancing).  This format is felt to be most appropriate for this patient at this time.  All issues noted in this document were discussed and addressed.

## 2021-01-28 ENCOUNTER — Ambulatory Visit: Payer: Medicare Other

## 2021-01-31 ENCOUNTER — Encounter: Payer: Self-pay | Admitting: Family Medicine

## 2021-01-31 ENCOUNTER — Ambulatory Visit (INDEPENDENT_AMBULATORY_CARE_PROVIDER_SITE_OTHER): Payer: Medicare Other | Admitting: Family Medicine

## 2021-01-31 ENCOUNTER — Other Ambulatory Visit: Payer: Self-pay

## 2021-01-31 DIAGNOSIS — Z23 Encounter for immunization: Secondary | ICD-10-CM | POA: Diagnosis not present

## 2021-02-01 NOTE — Progress Notes (Signed)
[name] [name] is a 70 y.o. female presents to the office today for: flu vaccine, per physician's orders.

## 2021-02-07 ENCOUNTER — Telehealth: Payer: Self-pay

## 2021-02-09 ENCOUNTER — Other Ambulatory Visit: Payer: Self-pay | Admitting: Internal Medicine

## 2021-02-11 ENCOUNTER — Other Ambulatory Visit: Payer: Self-pay | Admitting: Internal Medicine

## 2021-02-11 ENCOUNTER — Other Ambulatory Visit (INDEPENDENT_AMBULATORY_CARE_PROVIDER_SITE_OTHER): Payer: Medicare Other

## 2021-02-11 DIAGNOSIS — K912 Postsurgical malabsorption, not elsewhere classified: Secondary | ICD-10-CM

## 2021-02-11 DIAGNOSIS — D649 Anemia, unspecified: Secondary | ICD-10-CM

## 2021-02-11 DIAGNOSIS — K760 Fatty (change of) liver, not elsewhere classified: Secondary | ICD-10-CM

## 2021-02-11 LAB — COMPREHENSIVE METABOLIC PANEL
ALT: 25 U/L (ref 0–35)
AST: 28 U/L (ref 0–37)
Albumin: 3.2 g/dL — ABNORMAL LOW (ref 3.5–5.2)
Alkaline Phosphatase: 136 U/L — ABNORMAL HIGH (ref 39–117)
BUN: 23 mg/dL (ref 6–23)
CO2: 19 mEq/L (ref 19–32)
Calcium: 8.4 mg/dL (ref 8.4–10.5)
Chloride: 109 mEq/L (ref 96–112)
Creatinine, Ser: 1.43 mg/dL — ABNORMAL HIGH (ref 0.40–1.20)
GFR: 37.09 mL/min — ABNORMAL LOW (ref >60.00)
Glucose, Bld: 72 mg/dL (ref 70–99)
Potassium: 3.3 mEq/L — ABNORMAL LOW (ref 3.5–5.1)
Sodium: 137 mEq/L (ref 135–145)
Total Bilirubin: 0.4 mg/dL (ref 0.2–1.2)
Total Protein: 7.3 g/dL (ref 6.0–8.3)

## 2021-02-11 LAB — CBC WITH DIFFERENTIAL/PLATELET
Basophils Absolute: 0 10*3/uL (ref 0.0–0.1)
Basophils Relative: 0.5 % (ref 0.0–3.0)
Eosinophils Absolute: 0.4 10*3/uL (ref 0.0–0.7)
Eosinophils Relative: 6.8 % — ABNORMAL HIGH (ref 0.0–5.0)
HCT: 28.6 % — ABNORMAL LOW (ref 36.0–46.0)
Hemoglobin: 9.2 g/dL — ABNORMAL LOW (ref 12.0–15.0)
Lymphocytes Relative: 27.8 % (ref 12.0–46.0)
Lymphs Abs: 1.6 10*3/uL (ref 0.7–4.0)
MCHC: 32.1 g/dL (ref 30.0–36.0)
MCV: 96.2 fl (ref 78.0–100.0)
Monocytes Absolute: 0.5 10*3/uL (ref 0.1–1.0)
Monocytes Relative: 7.8 % (ref 3.0–12.0)
Neutro Abs: 3.4 10*3/uL (ref 1.4–7.7)
Neutrophils Relative %: 57.1 % (ref 43.0–77.0)
Platelets: 87 10*3/uL — ABNORMAL LOW (ref 150.0–400.0)
RBC: 2.98 Mil/uL — ABNORMAL LOW (ref 3.87–5.11)
RDW: 16.8 % — ABNORMAL HIGH (ref 11.5–15.5)
WBC: 5.9 10*3/uL (ref 4.0–10.5)

## 2021-02-11 LAB — PHOSPHORUS: Phosphorus: 4 mg/dL (ref 2.3–4.6)

## 2021-02-11 LAB — TRIGLYCERIDES: Triglycerides: 162 mg/dL — ABNORMAL HIGH (ref 0.0–149.0)

## 2021-02-11 LAB — MAGNESIUM: Magnesium: 1.8 mg/dL (ref 1.5–2.5)

## 2021-02-15 ENCOUNTER — Telehealth: Payer: Self-pay | Admitting: Radiology

## 2021-02-15 NOTE — Telephone Encounter (Signed)
Per Dr. Pascal Lux, prescription called in to patient's pharmacy for clobetasol propionate ointment 0.05%, 15 g tube; patient to apply to skin site around chest catheter as needed for itching.1 refill.

## 2021-03-11 ENCOUNTER — Other Ambulatory Visit (INDEPENDENT_AMBULATORY_CARE_PROVIDER_SITE_OTHER): Payer: Medicare Other

## 2021-03-11 DIAGNOSIS — K760 Fatty (change of) liver, not elsewhere classified: Secondary | ICD-10-CM | POA: Diagnosis not present

## 2021-03-11 DIAGNOSIS — D649 Anemia, unspecified: Secondary | ICD-10-CM | POA: Diagnosis not present

## 2021-03-11 DIAGNOSIS — K912 Postsurgical malabsorption, not elsewhere classified: Secondary | ICD-10-CM

## 2021-03-11 LAB — COMPREHENSIVE METABOLIC PANEL
ALT: 25 U/L (ref 0–35)
AST: 30 U/L (ref 0–37)
Albumin: 3.5 g/dL (ref 3.5–5.2)
Alkaline Phosphatase: 153 U/L — ABNORMAL HIGH (ref 39–117)
BUN: 20 mg/dL (ref 6–23)
CO2: 13 mEq/L — ABNORMAL LOW (ref 19–32)
Calcium: 8.6 mg/dL (ref 8.4–10.5)
Chloride: 114 mEq/L — ABNORMAL HIGH (ref 96–112)
Creatinine, Ser: 1.63 mg/dL — ABNORMAL HIGH (ref 0.40–1.20)
GFR: 31.68 mL/min — ABNORMAL LOW (ref >60.00)
Glucose, Bld: 80 mg/dL (ref 70–99)
Potassium: 3 mEq/L — ABNORMAL LOW (ref 3.5–5.1)
Sodium: 136 mEq/L (ref 135–145)
Total Bilirubin: 0.3 mg/dL (ref 0.2–1.2)
Total Protein: 8 g/dL (ref 6.0–8.3)

## 2021-03-11 LAB — CBC WITH DIFFERENTIAL/PLATELET
Basophils Absolute: 0.1 10*3/uL (ref 0.0–0.1)
Basophils Relative: 0.8 % (ref 0.0–3.0)
Eosinophils Absolute: 0.4 10*3/uL (ref 0.0–0.7)
Eosinophils Relative: 6.3 % — ABNORMAL HIGH (ref 0.0–5.0)
HCT: 29.4 % — ABNORMAL LOW (ref 36.0–46.0)
Hemoglobin: 9.2 g/dL — ABNORMAL LOW (ref 12.0–15.0)
Lymphocytes Relative: 29.4 % (ref 12.0–46.0)
Lymphs Abs: 1.8 10*3/uL (ref 0.7–4.0)
MCHC: 31.3 g/dL (ref 30.0–36.0)
MCV: 98 fl (ref 78.0–100.0)
Monocytes Absolute: 0.6 10*3/uL (ref 0.1–1.0)
Monocytes Relative: 9.5 % (ref 3.0–12.0)
Neutro Abs: 3.4 10*3/uL (ref 1.4–7.7)
Neutrophils Relative %: 54 % (ref 43.0–77.0)
Platelets: 111 10*3/uL — ABNORMAL LOW (ref 150.0–400.0)
RBC: 3 Mil/uL — ABNORMAL LOW (ref 3.87–5.11)
RDW: 15.3 % (ref 11.5–15.5)
WBC: 6.2 10*3/uL (ref 4.0–10.5)

## 2021-03-11 LAB — MAGNESIUM: Magnesium: 2 mg/dL (ref 1.5–2.5)

## 2021-03-11 LAB — PHOSPHORUS: Phosphorus: 4.8 mg/dL — ABNORMAL HIGH (ref 2.3–4.6)

## 2021-03-11 LAB — TRIGLYCERIDES: Triglycerides: 202 mg/dL — ABNORMAL HIGH (ref 0.0–149.0)

## 2021-03-12 LAB — PREALBUMIN: Prealbumin: 19 mg/dL (ref 17–34)

## 2021-03-15 ENCOUNTER — Inpatient Hospital Stay: Payer: Medicare Other | Attending: Hematology and Oncology

## 2021-03-18 ENCOUNTER — Telehealth: Payer: Self-pay | Admitting: Internal Medicine

## 2021-03-18 NOTE — Telephone Encounter (Signed)
Merrilee Seashore with [facility], [phone], called regarding patient's TPN.  He faxed over a new order to be signed so that she can receive her meds.  If you can, please fax signed order back today, or you may call and give a verbal order at the number listed above.  Thank you.

## 2021-03-18 NOTE — Telephone Encounter (Signed)
Please see note below and advise  

## 2021-03-18 NOTE — Telephone Encounter (Signed)
I called Merrilee Seashore and gave verbal order  He will send back a copy and I will sign that and fax back when I am in the office in next 1-2 days

## 2021-03-23 IMAGING — MR MR HEAD W/O CM
6 of 11 series · 24 of 48 positions shown · non-contrast
Comparison: CT angiogram head/neck 07/23/2020. Noncontrast head CT
07/23/2020.

CLINICAL DATA: Neuro deficit, acute, stroke suspected.

EXAM:
MRI HEAD WITHOUT CONTRAST
TECHNIQUE: Multiplanar, multiecho pulse sequences of the brain and surrounding
structures were obtained without intravenous contrast.

[Series 2: DWI · axial · 3.0mm · 0.94mm/px · z∈[-95,+54]mm · 7 of 104 slices shown (1 of 2)]
[im 1/104]
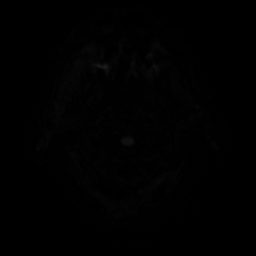
[im 18/104]
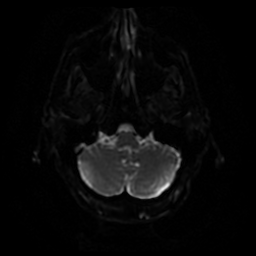
[im 35/104]
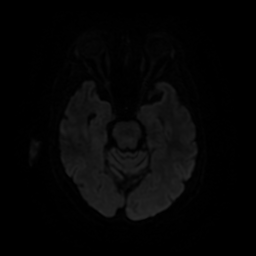
[im 52/104]
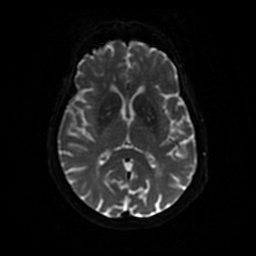
[im 69/104]
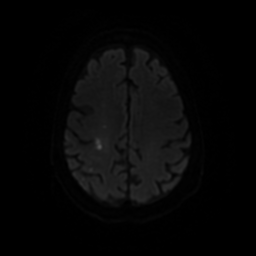
[im 86/104]
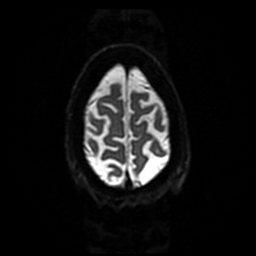
[im 104/104]
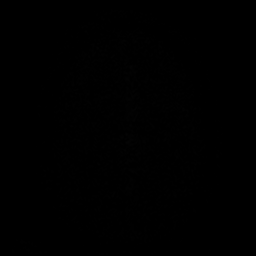

[Series 3: DWI · coronal · 4.0mm · 0.94mm/px · 6 of 78 slices shown (2 of 2)]
[im 1/78]
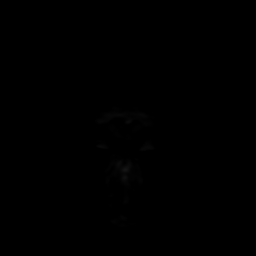
[im 16/78]
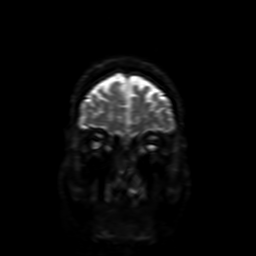
[im 31/78]
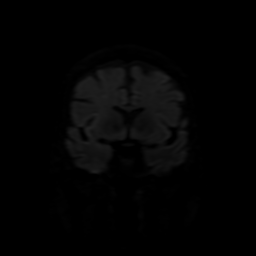
[im 47/78]
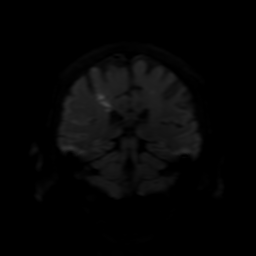
[im 62/78]
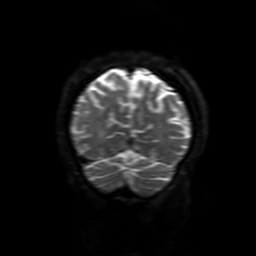
[im 78/78]
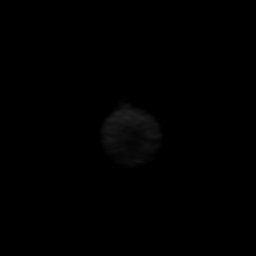

[Series 4: FLAIR · sagittal · 5.0mm · 0.23mm/px · 2 of 26 slices shown (1 of 2)]
[im 1/26]
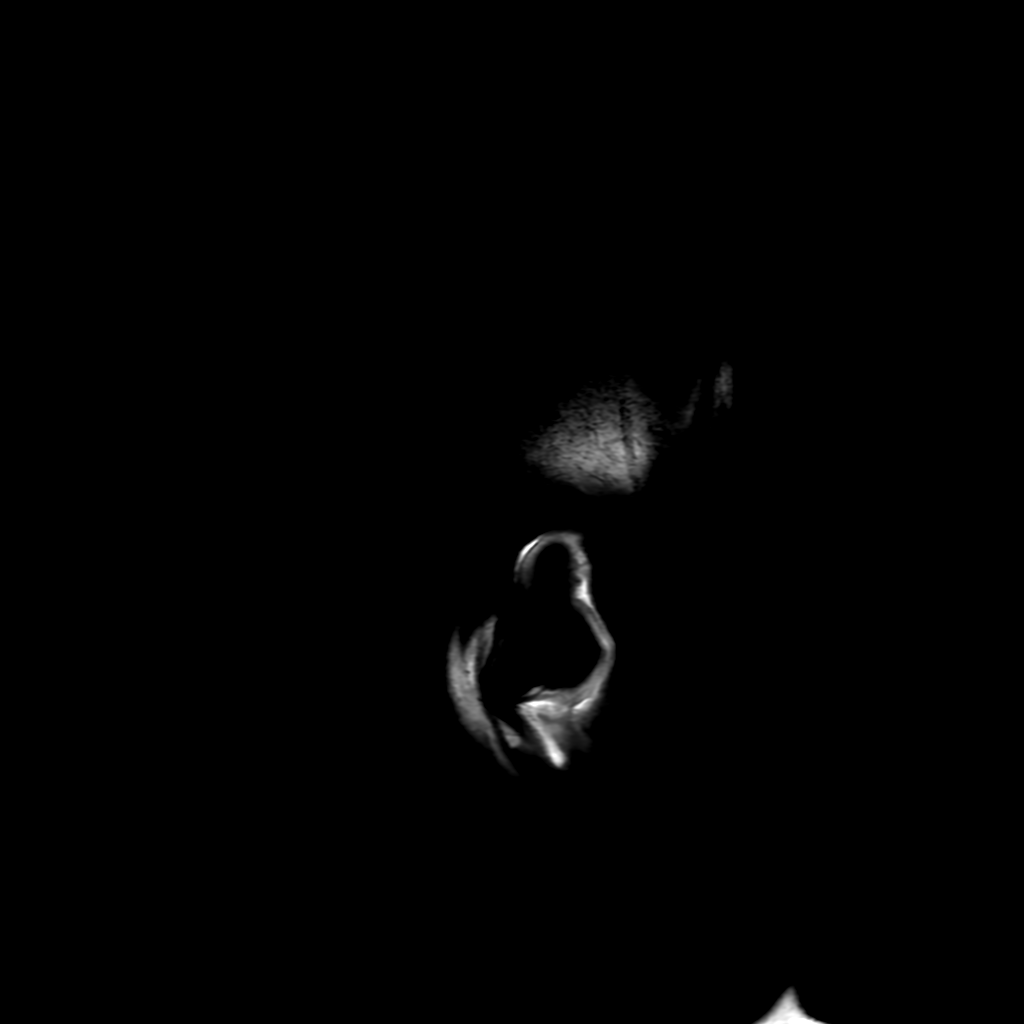
[im 26/26]
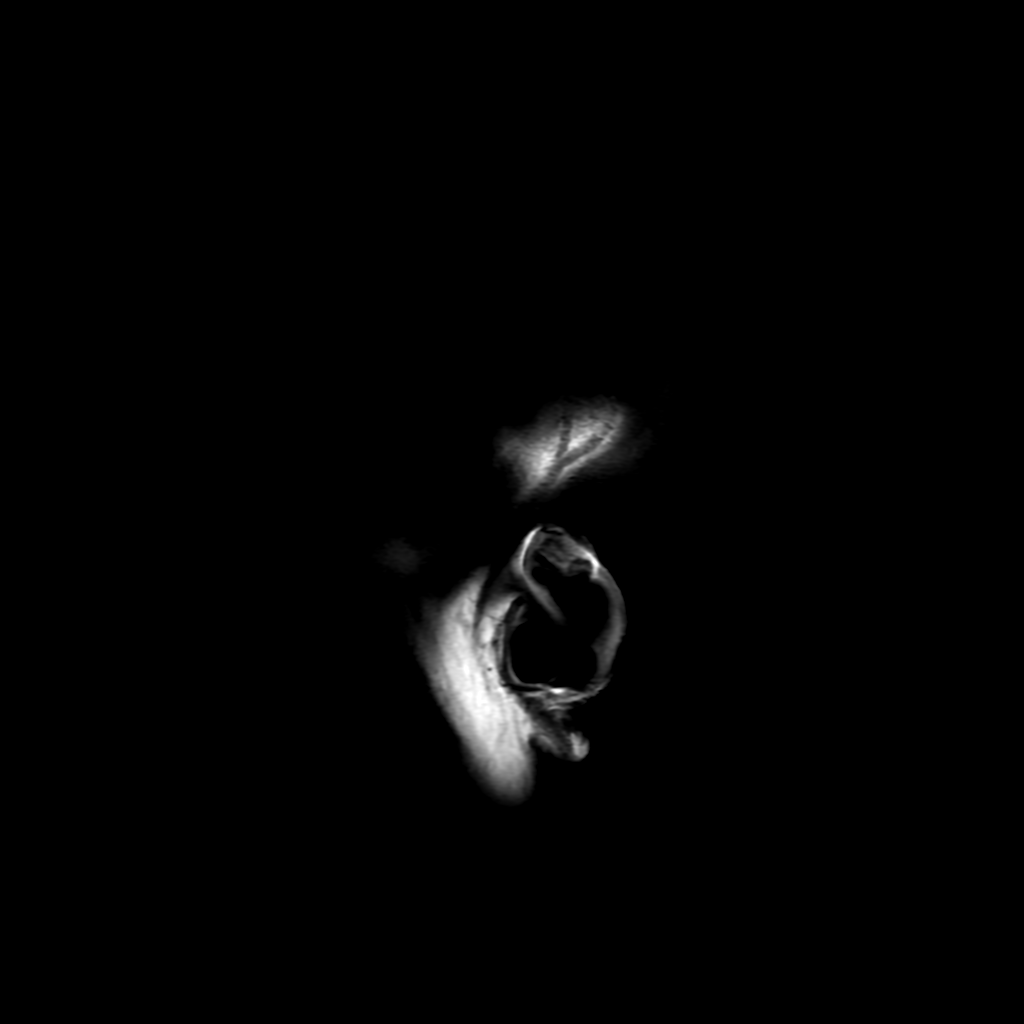

[Series 6: FLAIR · axial · 3.0mm · 0.45mm/px · z∈[-93,+59]mm · 2 of 27 slices shown (2 of 2)]
[im 1/27]
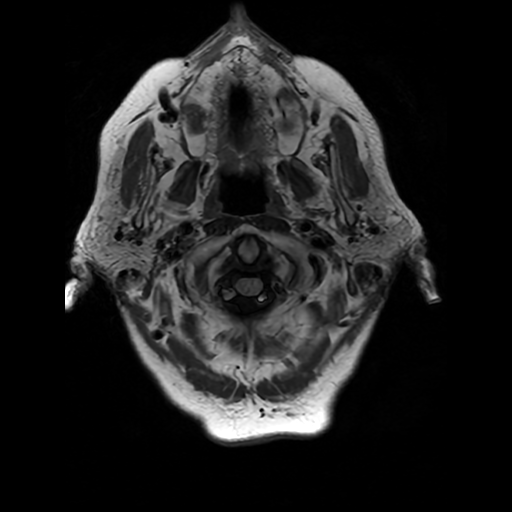
[im 27/27]
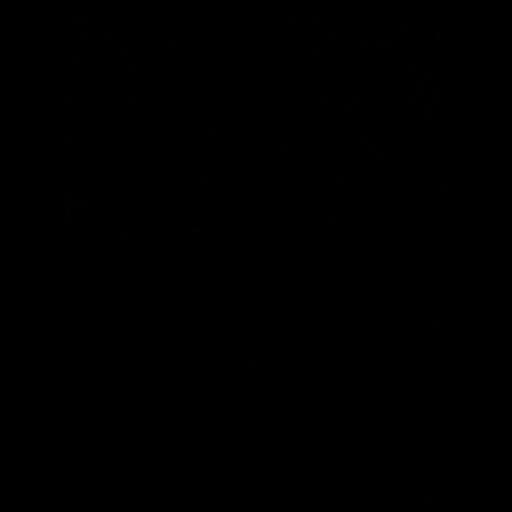

[Series 250: ADC · axial · 3.0mm · 0.94mm/px · z∈[-95,+54]mm · 4 of 52 slices shown (1 of 2)]
[im 1/52]
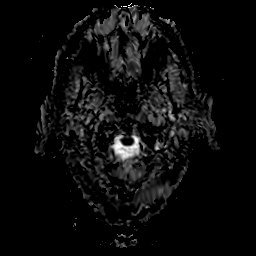
[im 18/52]
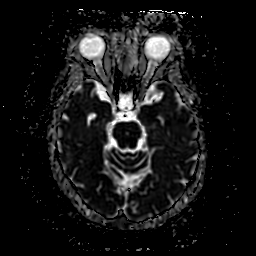
[im 35/52]
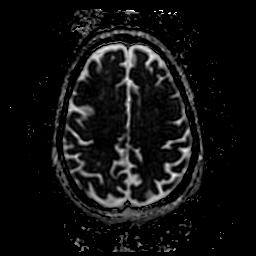
[im 52/52]
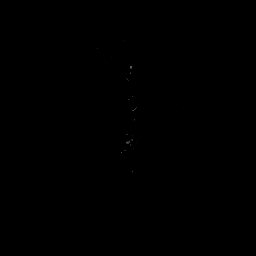

[Series 350: ADC · coronal · 4.0mm · 0.94mm/px · 3 of 38 slices shown (2 of 2)]
[im 1/38]
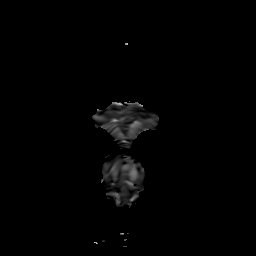
[im 19/38]
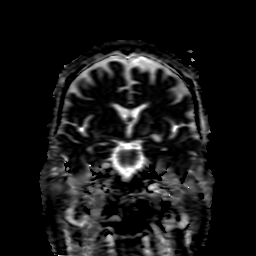
[im 38/38]
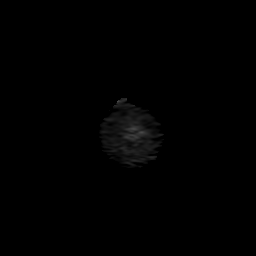

[24 of 48 positions shown; findings below may reference images not displayed]

FINDINGS: Brain:

Mild intermittent motion degradation, limiting evaluation.

Mild cerebral atrophy.

There are numerous small acute cortical/subcortical infarcts within
the right cerebral hemisphere, predominantly within the right MCA
territory, as well as right MCA/ACA and right MCA/PCA watershed
territories. Infarcts are present within the right frontal lobe,
parietal lobe, temporal lobe and temporal occipital junction.

Additionally, there are several scattered tiny acute infarcts within
the left frontal white matter within the left MCA/ACA watershed
territory.

Background moderate multifocal T2/FLAIR hyperintensity within the
cerebral white matter which is nonspecific, but compatible with
chronic small vessel ischemic disease. This includes several chronic
lacunar infarcts within the bilateral frontal lobe white matter.

Nonspecific chronic microhemorrhage within the posterior left
temporal lobe (series 7, image 40)

No evidence of intracranial mass.

No extra-axial fluid collection.

No midline shift.

Partially empty sella turcica.

Vascular: Expected proximal arterial flow voids.

Skull and upper cervical spine: No focal marrow lesion. Incompletely
assessed upper cervical spondylosis.

Sinuses/Orbits: Visualized orbits show no acute finding. Mild
mucosal thickening within the bilateral ethmoid and maxillary
sinuses.
IMPRESSION: Numerous small scattered acute cortical/subcortical infarcts within
the right cerebral hemisphere, predominantly within the right MCA
territory, as well as right MCA/ACA and right MCA/PCA watershed
territories. Infarcts are present within the right frontal lobe,
parietal lobe, temporal lobe and temporal occipital junction.

Additionally, there are several scattered tiny acute infarcts within
the left frontal white matter within the left MCA/ACA watershed
territory.

The pattern of the above described infarcts is suggestive of an
embolic process and clinical correlation is recommended.

Background mild cerebral atrophy and mild-to-moderate chronic small
vessel ischemic disease.

## 2021-04-11 ENCOUNTER — Encounter: Payer: Self-pay | Admitting: Internal Medicine

## 2021-04-12 ENCOUNTER — Other Ambulatory Visit (INDEPENDENT_AMBULATORY_CARE_PROVIDER_SITE_OTHER): Payer: Medicare Other

## 2021-04-12 ENCOUNTER — Other Ambulatory Visit: Payer: Self-pay

## 2021-04-12 ENCOUNTER — Encounter: Payer: Self-pay | Admitting: Internal Medicine

## 2021-04-12 DIAGNOSIS — K760 Fatty (change of) liver, not elsewhere classified: Secondary | ICD-10-CM

## 2021-04-12 DIAGNOSIS — D649 Anemia, unspecified: Secondary | ICD-10-CM

## 2021-04-12 DIAGNOSIS — K912 Postsurgical malabsorption, not elsewhere classified: Secondary | ICD-10-CM | POA: Diagnosis not present

## 2021-04-12 DIAGNOSIS — R710 Precipitous drop in hematocrit: Secondary | ICD-10-CM

## 2021-04-12 LAB — COMPREHENSIVE METABOLIC PANEL
ALT: 13 U/L (ref 0–35)
AST: 17 U/L (ref 0–37)
Albumin: 2.9 g/dL — ABNORMAL LOW (ref 3.5–5.2)
Alkaline Phosphatase: 105 U/L (ref 39–117)
BUN: 32 mg/dL — ABNORMAL HIGH (ref 6–23)
CO2: 28 mEq/L (ref 19–32)
Calcium: 8.5 mg/dL (ref 8.4–10.5)
Chloride: 104 mEq/L (ref 96–112)
Creatinine, Ser: 1.05 mg/dL (ref 0.40–1.20)
GFR: 53.67 mL/min — ABNORMAL LOW (ref >60.00)
Glucose, Bld: 109 mg/dL — ABNORMAL HIGH (ref 70–99)
Potassium: 5.2 mEq/L — ABNORMAL HIGH (ref 3.5–5.1)
Sodium: 137 mEq/L (ref 135–145)
Total Bilirubin: 0.3 mg/dL (ref 0.2–1.2)
Total Protein: 7 g/dL (ref 6.0–8.3)

## 2021-04-12 LAB — CBC WITH DIFFERENTIAL/PLATELET
Basophils Absolute: 0 10*3/uL (ref 0.0–0.1)
Basophils Relative: 1.1 % (ref 0.0–3.0)
Eosinophils Absolute: 0.3 10*3/uL (ref 0.0–0.7)
Eosinophils Relative: 10.3 % — ABNORMAL HIGH (ref 0.0–5.0)
HCT: 23.3 % — CL (ref 36.0–46.0)
Hemoglobin: 7.5 g/dL — CL (ref 12.0–15.0)
Lymphocytes Relative: 26.4 % (ref 12.0–46.0)
Lymphs Abs: 0.9 10*3/uL (ref 0.7–4.0)
MCHC: 32.3 g/dL (ref 30.0–36.0)
MCV: 95.1 fl (ref 78.0–100.0)
Monocytes Absolute: 0.3 10*3/uL (ref 0.1–1.0)
Monocytes Relative: 9.7 % (ref 3.0–12.0)
Neutro Abs: 1.7 10*3/uL (ref 1.4–7.7)
Neutrophils Relative %: 52.5 % (ref 43.0–77.0)
Platelets: 81 10*3/uL — ABNORMAL LOW (ref 150.0–400.0)
RBC: 2.45 Mil/uL — ABNORMAL LOW (ref 3.87–5.11)
RDW: 13.8 % (ref 11.5–15.5)
WBC: 3.3 10*3/uL — ABNORMAL LOW (ref 4.0–10.5)

## 2021-04-12 LAB — PHOSPHORUS: Phosphorus: 3.2 mg/dL (ref 2.3–4.6)

## 2021-04-12 LAB — TRIGLYCERIDES: Triglycerides: 70 mg/dL (ref 0.0–149.0)

## 2021-04-12 LAB — MAGNESIUM: Magnesium: 2.1 mg/dL (ref 1.5–2.5)

## 2021-04-13 LAB — PREALBUMIN: Prealbumin: 14 mg/dL — ABNORMAL LOW (ref 17–34)

## 2021-04-15 ENCOUNTER — Telehealth: Payer: Self-pay

## 2021-04-15 ENCOUNTER — Other Ambulatory Visit (INDEPENDENT_AMBULATORY_CARE_PROVIDER_SITE_OTHER): Payer: Medicare Other

## 2021-04-15 DIAGNOSIS — K760 Fatty (change of) liver, not elsewhere classified: Secondary | ICD-10-CM | POA: Diagnosis not present

## 2021-04-15 DIAGNOSIS — R6889 Other general symptoms and signs: Secondary | ICD-10-CM

## 2021-04-15 DIAGNOSIS — R710 Precipitous drop in hematocrit: Secondary | ICD-10-CM | POA: Diagnosis not present

## 2021-04-15 DIAGNOSIS — K912 Postsurgical malabsorption, not elsewhere classified: Secondary | ICD-10-CM | POA: Diagnosis not present

## 2021-04-15 DIAGNOSIS — D649 Anemia, unspecified: Secondary | ICD-10-CM | POA: Diagnosis not present

## 2021-04-15 LAB — CBC WITH DIFFERENTIAL/PLATELET
Basophils Absolute: 0 10*3/uL (ref 0.0–0.1)
Basophils Relative: 0.9 % (ref 0.0–3.0)
Eosinophils Absolute: 0.4 10*3/uL (ref 0.0–0.7)
Eosinophils Relative: 7.2 % — ABNORMAL HIGH (ref 0.0–5.0)
HCT: 23.8 % — CL (ref 36.0–46.0)
Hemoglobin: 7.8 g/dL — CL (ref 12.0–15.0)
Lymphocytes Relative: 21.3 % (ref 12.0–46.0)
Lymphs Abs: 1.1 10*3/uL (ref 0.7–4.0)
MCHC: 32.6 g/dL (ref 30.0–36.0)
MCV: 94.7 fl (ref 78.0–100.0)
Monocytes Absolute: 0.5 10*3/uL (ref 0.1–1.0)
Monocytes Relative: 9.6 % (ref 3.0–12.0)
Neutro Abs: 3.1 10*3/uL (ref 1.4–7.7)
Neutrophils Relative %: 61 % (ref 43.0–77.0)
Platelets: 94 10*3/uL — ABNORMAL LOW (ref 150.0–400.0)
RBC: 2.51 Mil/uL — ABNORMAL LOW (ref 3.87–5.11)
RDW: 14.1 % (ref 11.5–15.5)
WBC: 5.2 10*3/uL (ref 4.0–10.5)

## 2021-04-15 LAB — COMPREHENSIVE METABOLIC PANEL
ALT: 20 U/L (ref 0–35)
AST: 31 U/L (ref 0–37)
Albumin: 3.1 g/dL — ABNORMAL LOW (ref 3.5–5.2)
Alkaline Phosphatase: 137 U/L — ABNORMAL HIGH (ref 39–117)
BUN: 30 mg/dL — ABNORMAL HIGH (ref 6–23)
CO2: 26 mEq/L (ref 19–32)
Calcium: 8.6 mg/dL (ref 8.4–10.5)
Chloride: 106 mEq/L (ref 96–112)
Creatinine, Ser: 1.18 mg/dL (ref 0.40–1.20)
GFR: 46.65 mL/min — ABNORMAL LOW (ref >60.00)
Glucose, Bld: 99 mg/dL (ref 70–99)
Potassium: 5.2 mEq/L — ABNORMAL HIGH (ref 3.5–5.1)
Sodium: 138 mEq/L (ref 135–145)
Total Bilirubin: 0.4 mg/dL (ref 0.2–1.2)
Total Protein: 7.4 g/dL (ref 6.0–8.3)

## 2021-04-15 LAB — PHOSPHORUS: Phosphorus: 3.3 mg/dL (ref 2.3–4.6)

## 2021-04-15 LAB — TRIGLYCERIDES: Triglycerides: 94 mg/dL (ref 0.0–149.0)

## 2021-04-15 LAB — MAGNESIUM: Magnesium: 2.2 mg/dL (ref 1.5–2.5)

## 2021-04-15 NOTE — Telephone Encounter (Signed)
Lab result is stable with last week.  She should monitor for bleeding and I will review things and make any further recommendations later.

## 2021-04-15 NOTE — Telephone Encounter (Signed)
Lab called with critical Hgb of 7.8 and hematocrit 23.8. Will route to Dr Carlean Purl high priority

## 2021-04-15 NOTE — Telephone Encounter (Signed)
[name] [name] informed and said no bleeding and she will watch for it. She is just alittle tired. She wonders if an infection is around the corner because she saw in her chart that her platelets were low.

## 2021-04-16 LAB — PREALBUMIN: Prealbumin: 17 mg/dL (ref 17–34)

## 2021-04-22 ENCOUNTER — Other Ambulatory Visit: Payer: Self-pay

## 2021-04-22 ENCOUNTER — Telehealth: Payer: Self-pay

## 2021-04-22 ENCOUNTER — Other Ambulatory Visit (INDEPENDENT_AMBULATORY_CARE_PROVIDER_SITE_OTHER): Payer: Medicare Other

## 2021-04-22 DIAGNOSIS — D649 Anemia, unspecified: Secondary | ICD-10-CM | POA: Diagnosis not present

## 2021-04-22 DIAGNOSIS — R6889 Other general symptoms and signs: Secondary | ICD-10-CM

## 2021-04-22 DIAGNOSIS — K760 Fatty (change of) liver, not elsewhere classified: Secondary | ICD-10-CM | POA: Diagnosis not present

## 2021-04-22 DIAGNOSIS — R710 Precipitous drop in hematocrit: Secondary | ICD-10-CM | POA: Diagnosis not present

## 2021-04-22 DIAGNOSIS — K912 Postsurgical malabsorption, not elsewhere classified: Secondary | ICD-10-CM

## 2021-04-22 DIAGNOSIS — R918 Other nonspecific abnormal finding of lung field: Secondary | ICD-10-CM

## 2021-04-22 LAB — CBC WITH DIFFERENTIAL/PLATELET
Basophils Absolute: 0.1 10*3/uL (ref 0.0–0.1)
Basophils Relative: 1.3 % (ref 0.0–3.0)
Eosinophils Absolute: 0.4 10*3/uL (ref 0.0–0.7)
Eosinophils Relative: 4.7 % (ref 0.0–5.0)
HCT: 24.8 % — ABNORMAL LOW (ref 36.0–46.0)
Hemoglobin: 8.1 g/dL — ABNORMAL LOW (ref 12.0–15.0)
Lymphocytes Relative: 15.4 % (ref 12.0–46.0)
Lymphs Abs: 1.3 10*3/uL (ref 0.7–4.0)
MCHC: 32.5 g/dL (ref 30.0–36.0)
MCV: 95.1 fl (ref 78.0–100.0)
Monocytes Absolute: 0.7 10*3/uL (ref 0.1–1.0)
Monocytes Relative: 8.5 % (ref 3.0–12.0)
Neutro Abs: 6.1 10*3/uL (ref 1.4–7.7)
Neutrophils Relative %: 70.1 % (ref 43.0–77.0)
Platelets: 108 10*3/uL — ABNORMAL LOW (ref 150.0–400.0)
RBC: 2.61 Mil/uL — ABNORMAL LOW (ref 3.87–5.11)
RDW: 14 % (ref 11.5–15.5)
WBC: 8.7 10*3/uL (ref 4.0–10.5)

## 2021-04-22 LAB — MAGNESIUM: Magnesium: 1.9 mg/dL (ref 1.5–2.5)

## 2021-04-22 LAB — TRIGLYCERIDES: Triglycerides: 77 mg/dL (ref 0.0–149.0)

## 2021-04-22 LAB — VITAMIN B12: Vitamin B-12: 491 pg/mL (ref 211–911)

## 2021-04-22 LAB — PHOSPHORUS: Phosphorus: 2.6 mg/dL (ref 2.3–4.6)

## 2021-04-22 NOTE — Telephone Encounter (Signed)
Pt scheduled for a follow up Ct Chest on 05/15/2021 at 3:40. Pt to arrive at 3:20. : Pt made aware  Location is [facility]  [address]:  Pt made aware Pt verbalized understanding with all questions answered.

## 2021-04-23 LAB — IRON,TIBC AND FERRITIN PANEL
%SAT: 16 % (calc) (ref 16–45)
Ferritin: 51 ng/mL (ref 16–288)
Iron: 49 ug/dL (ref 45–160)
TIBC: 316 mcg/dL (calc) (ref 250–450)

## 2021-04-23 LAB — PREALBUMIN: Prealbumin: 18 mg/dL (ref 17–34)

## 2021-04-24 NOTE — Telephone Encounter (Signed)
Patient has appointment tomorrow 04/25/21.

## 2021-04-25 ENCOUNTER — Ambulatory Visit (INDEPENDENT_AMBULATORY_CARE_PROVIDER_SITE_OTHER): Payer: Medicare Other | Admitting: Internal Medicine

## 2021-04-25 ENCOUNTER — Encounter: Payer: Self-pay | Admitting: Internal Medicine

## 2021-04-25 VITALS — BP 120/70 | HR 67 | Ht 68.0 in | Wt 147.0 lb

## 2021-04-25 DIAGNOSIS — D638 Anemia in other chronic diseases classified elsewhere: Secondary | ICD-10-CM | POA: Diagnosis not present

## 2021-04-25 DIAGNOSIS — K912 Postsurgical malabsorption, not elsewhere classified: Secondary | ICD-10-CM

## 2021-04-25 DIAGNOSIS — K760 Fatty (change of) liver, not elsewhere classified: Secondary | ICD-10-CM

## 2021-04-25 DIAGNOSIS — I255 Ischemic cardiomyopathy: Secondary | ICD-10-CM | POA: Diagnosis not present

## 2021-04-25 DIAGNOSIS — R5383 Other fatigue: Secondary | ICD-10-CM

## 2021-04-25 DIAGNOSIS — R918 Other nonspecific abnormal finding of lung field: Secondary | ICD-10-CM

## 2021-04-25 DIAGNOSIS — Z7901 Long term (current) use of anticoagulants: Secondary | ICD-10-CM

## 2021-04-25 DIAGNOSIS — K6389 Other specified diseases of intestine: Secondary | ICD-10-CM

## 2021-04-25 DIAGNOSIS — I5022 Chronic systolic (congestive) heart failure: Secondary | ICD-10-CM | POA: Diagnosis not present

## 2021-04-25 NOTE — Progress Notes (Signed)
[name] [name] 70 y.o. 11/12/1950 [id number]  Assessment & Plan:   Encounter Diagnoses  Name Primary?   Acquired short bowel syndrome Yes   Bacterial overgrowth syndrome    NAFLD (nonalcoholic fatty liver disease)    Anemia of chronic disease    Chronic systolic heart failure (HCC)    Fatigue, unspecified type    Chronic anticoagulation    Pulmonary nodules    I think she should have an EGD and a colonoscopy.  A colonoscopy because she is due for surveillance because of polyps the Gattex has been shown to increase the risk of colon polyps.  I think an upper endoscopy due to the decreased hemoglobin as well.  No strong suspicion for GI blood loss but appropriate to evaluate that.  However she has chronic heart failure issues, and she is very fatigued and prior to scheduling a procedure with sedation  I think it makes sense to repeat an echocardiogram.  I would like to make sure she is suitable for ambulatory surgery center procedures versus having a need to be done at the hospital.  She is due for heart failure clinic follow-up in late January as well.  Perhaps this fatigue issue is really just a post-COVID phenomenon.  She is not depressed as best we can tell that can sometimes cause fatigue.  Her hemoglobin has been in a similar range for a while and it may be contributing as well.  She has follow-up with hematology in early 2023 as well I think that is February.  She may end up needing erythropoietin supplementation.    She has pancytopenia really, she has splenomegaly but she does not have cirrhosis that we know so I do not think she has portal hypertension but I think it is possible.   Note that she has chronic anticoagulation and we would have to hold her Xarelto. She says that when she does need IVs placed there are no significant problems with that.  She asked me about trying to get rehab again i.e. physical therapy to help improve strength again (had that after her strokes) and  I suggested she asked [name] for a referral on that.  She will continue getting her TPN at home as usual, also remain on Gattex.  She has an upcoming already scheduled chest CT to follow-up pulmonary nodule seen on prior imaging.  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  Recently on routine labs that are coordinated through me for her TPN her hemoglobin dipped below 8.  She has had a hemoglobin in the 8 range for about 6 months.  She has seen Dr. Lindi Adie of hematology and his diagnosis has been anemia of chronic disease.  She has an appointment with him in February.  He had talked about possibly using EPO treatment.  Her ferritin is 52 and her iron saturation is normal though these are lowish.  She called them to schedule a routine follow-up.  She does not have any new abdominal symptoms.  With her short bowel she will have diarrhea at times depending upon what she eats.  She continues on TPN at home.  This is adjusted periodically through the service that  provides that, managed by dietitian and pharmacists.  Her main complaint is that she says she cannot shake fatigue that seems to have set in after COVID.  She has not seen any rectal bleeding or melena.  She continues on her Xarelto.  She has some purpura/easy bruising issues.  Last colonoscopy November 2017 with 5-year repeat recommended no polyps at that time   Last EGD 2010.  With erosive esophagitis.  No signs of portal hypertension.  She had a procedure by Dr. Pascal Lux of interventional radiology this summer to open up stenosed and thrombosed venous structures to allow central venous  access again, she had been relegated to femoral access and fortunately he was able to open up thrombosed venous structures and allow a central catheter again.   CBC Latest Ref Rng & Units 04/22/2021 04/15/2021 04/12/2021  WBC 4.0 - 10.5 K/uL 8.7 5.2 3.3(L)  Hemoglobin 12.0 - 15.0 g/dL 8.1 Repeated and verified X2.(L) 7.8 Repeated and verified X2.(LL) 7.5(LL)  Hematocrit 36.0 - 46.0 % 24.8(L) 23.8 Repeated and verified X2.(LL) 23.3(LL)  Platelets 150.0 - 400.0 K/uL 108.0(L) 94.0(L) 81.0(L)   Lab Results  Component Value Date   IRON 49 04/22/2021   TIBC 316 04/22/2021   FERRITIN 51 04/22/2021   Lab Results  Component Value Date   VITAMINB12 491 04/22/2021    Allergies  Allergen Reactions   Penicillins Itching, Swelling and Other (See Comments)    Reaction:  Facial swelling Has patient had a PCN reaction causing immediate rash, facial/tongue/throat swelling, SOB or lightheadedness with hypotension: Yes Has patient had a PCN reaction causing severe rash involving mucus membranes or skin necrosis: No Has patient had a PCN reaction that required hospitalization No Has patient had a PCN reaction occurring within the last 10 years: No If all of the above answers are "NO", then may proceed with Cephalosporin use.   Current Meds  Medication Sig   acetaminophen (TYLENOL) 500 MG tablet Take 1,000 mg by mouth daily as needed for mild pain.   ADULT TPN Inject 1,800 mLs into the vein 4 (four) times a week. Pt receives home TPN from [facility]:  1800 mL bag, four nights weekly (Monday, Tuesday, Wednesday, Thursday for 8 hours (includes 1 hour taper up and down).   baclofen (LIORESAL) 10 MG tablet Take 1 tablet (10 mg total) by mouth 2 (two) times daily as needed for muscle spasms.   Calcium Carb-Cholecalciferol (CALCIUM + D3 PO) Take 2 tablets by mouth daily.   cetirizine (ZYRTEC) 10 MG tablet Take 1 tablet (10 mg total) by mouth at bedtime as needed for allergies.   diphenhydrAMINE (BENADRYL) 25  mg capsule Take 25-50 mg by mouth every 6 (six) hours as needed for itching or sleep.    fluticasone (FLONASE) 50 MCG/ACT nasal spray Place 2 sprays into both nostrils daily as needed for allergies or rhinitis.   furosemide (LASIX) 20 MG tablet TAKE ONE TABLET BY MOUTH AS NEEDED FOR SWELLING   Heparin Sodium, Porcine, (HEPARIN LOCK FLUSH IJ) Inject 5 mLs as directed 4 (four) times a week. Monday, Tuesday, Wednesday, Thursday in the morning with TPN   Multiple Vitamins-Minerals (ADULT GUMMY PO) Take 2 tablets by mouth daily.   omeprazole (PRILOSEC) 40 MG capsule Take 1 capsule (40 mg total) by mouth daily before breakfast.   ondansetron (ZOFRAN-ODT) 8 MG disintegrating tablet TAKE 1 TABLET (8 MG TOTAL) BY MOUTH EVERY 8 (EIGHT) HOURS AS NEEDED FOR NAUSEA OR VOMITING.   sodium chloride 0.9 % infusion Inject  900 mLs into the vein every 14 (fourteen) days. Uses for TPN   spironolactone (ALDACTONE) 25 MG tablet Take 0.5 tablets (12.5 mg total) by mouth daily.   Teduglutide, rDNA, 5 MG KIT Inject 3.1 application into the skin every morning.   triamcinolone cream (KENALOG) 0.1 % Apply 1 application topically as needed.   vitamin E 1000 UNIT capsule Take 1,000 Units by mouth daily.   XARELTO 20 MG TABS tablet TAKE 1 TABLET (20 MG TOTAL) BY MOUTH DAILY WITH SUPPER.   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   Fracture of left clavicle     History of blood transfusion 2013   anemia   Hx of cardiovascular stress test    Myoview (9/15):  inf-apical scar; no ischemia; EF 47% - low risk    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 12/12/2011   Osteoporosis    Pancytopenia 10/07/2011   Pathologic fracture of neck of femur ([place])    Personal history of colonic polyps    Renal insufficiency    hx of yrs ago   Serratia marcescens infection - bactermia assoc w/ PICC 01/18/2015   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HCC)    Splenomegaly    By ultrasound   Stroke ([place]) 07/2020   Thrombophilia ([place])    Vitamin D deficiency    Past Surgical History:  Procedure Laterality Date   APPENDECTOMY  yrs ago   CHOLECYSTECTOMY  yrs ago   COLONOSCOPY  12/05/2005   internal hemorrhoids (for polyp surveillance)   COLONOSCOPY  04/26/2012   Procedure: COLONOSCOPY;  Surgeon: Gatha Mayer, MD;  Location: [facility];  Service: Endoscopy;  Laterality: N/A;   COLONOSCOPY WITH PROPOFOL N/A 03/18/2016   Procedure: COLONOSCOPY WITH PROPOFOL;  Surgeon: Gatha Mayer, MD;  Location: [facility];  Service: Endoscopy;  Laterality: N/A;   ESOPHAGOGASTRODUODENOSCOPY  01/22/2009   erosive esophagitis   HARDWARE REMOVAL Left 12/31/2017   Procedure: HARDWARE REMOVAL;  Surgeon: Rod Can, MD;  Location: [facility];  Service: Orthopedics;  Laterality: Left;   INTRAMEDULLARY (IM) NAIL INTERTROCHANTERIC Left 12/31/2017   Procedure: INTRAMEDULLARY (IM) NAIL SUBTROCHANTRIC;  Surgeon: Rod Can, MD;  Location: [facility];  Service: Orthopedics;  Laterality: Left;   IR CV LINE INJECTION  03/23/2018   IR FLUORO GUIDE CV LINE LEFT  01/01/2018   IR FLUORO GUIDE CV LINE LEFT  03/24/2018   IR FLUORO GUIDE CV LINE LEFT  05/21/2018   IR FLUORO GUIDE CV LINE LEFT  07/09/2018   IR FLUORO GUIDE CV LINE LEFT  12/28/2020   IR FLUORO GUIDE CV LINE RIGHT  11/01/2020   IR GENERIC HISTORICAL  07/04/2016   IR REMOVAL TUN CV CATH W/O FL 07/04/2016 Ascencion Dike, PA-C [facility]   IR GENERIC HISTORICAL  07/10/2016   IR US  GUIDE VASC ACCESS LEFT 07/10/2016 Arne Cleveland, MD [facility]   IR GENERIC HISTORICAL  07/10/2016   IR FLUORO GUIDE CV LINE LEFT 07/10/2016 Arne Cleveland, MD [facility]   IR PATIENT EVAL TECH 0-60 MINS  10/07/2017   IR PATIENT EVAL TECH 0-60 MINS  03/09/2018   IR RADIOLOGIST EVAL & MGMT  07/21/2017   IR RADIOLOGIST EVAL & MGMT  11/14/2020   IR RADIOLOGIST EVAL & MGMT  11/27/2020   IR RADIOLOGIST EVAL & MGMT  01/24/2021   IR REMOVAL TUN CV CATH W/O FL  12/28/2020   IR TRANSCATH PLC STENT  INITIAL VEIN  INC ANGIOPLASTY  12/28/2020   IR US GUIDE VASC ACCESS LEFT  11/01/2020   IR US GUIDE VASC ACCESS LEFT  12/28/2020   IR US GUIDE VASC ACCESS LEFT  12/28/2020   IR US GUIDE VASC ACCESS LEFT  12/28/2020   IR US GUIDE VASC ACCESS RIGHT  11/01/2020   IR VENO/EXT/UNI LEFT  11/01/2020   IR VENOCAVAGRAM SVC  12/28/2020   LEFT HEART CATH AND CORONARY ANGIOGRAPHY N/A 06/26/2020   Procedure: LEFT HEART CATH AND CORONARY ANGIOGRAPHY;  Surgeon: Jolaine Artist, MD;  Location: [facility];  Service: Cardiovascular;  Laterality: N/A;   ORIF PROXIMAL FEMORAL FRACTURE W/ ITST NAIL SYSTEM  03/2007   left, Dr. 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   Review of Systems "Clogged right ear" planning to see [name]  Objective:   Physical Exam BP 120/70    Pulse 67    Ht $R'5\' 8"'uc$  (1.727 m)    Wt 147 lb (66.7 kg)    LMP 02/26/2014    BMI 22.35 kg/m  Chronically  ill well-developed well-nourished no acute distress Lungs are clear I hear normal heart sounds S1-S2 no rubs or gallops.  There is a central venous catheter in the left upper chest below the clavicle. The abdomen shows surgical scars is soft there is some loose skin no organomegaly or mass bowel sounds present She is alert and oriented x3 and has not bright mood and affect as usual

## 2021-04-25 NOTE — Patient Instructions (Signed)
You will be contacted about getting an echocardiogram set up. There phone # is [phone].  Due to recent changes in healthcare laws, you may see the results of your imaging and laboratory studies on MyChart before your provider has had a chance to review them.  We understand that in some cases there may be results that are confusing or concerning to you. Not all laboratory results come back in the same time frame and the provider may be waiting for multiple results in order to interpret others.  Please give [place] 48 hours in order for your provider to thoroughly review all the results before contacting the office for clarification of your results.     I appreciate the opportunity to care for you. Silvano Rusk, MD, [facility]

## 2021-04-29 ENCOUNTER — Ambulatory Visit (INDEPENDENT_AMBULATORY_CARE_PROVIDER_SITE_OTHER): Payer: Medicare Other | Admitting: Family Medicine

## 2021-04-29 ENCOUNTER — Other Ambulatory Visit: Payer: Self-pay

## 2021-04-29 VITALS — BP 116/60 | HR 70 | Temp 97.2°F | Ht 68.0 in | Wt 144.8 lb

## 2021-04-29 DIAGNOSIS — H8101 Meniere's disease, right ear: Secondary | ICD-10-CM | POA: Diagnosis not present

## 2021-04-29 MED ORDER — MECLIZINE HCL 25 MG PO TABS: 25.0000 mg | ORAL_TABLET | Freq: Three times a day (TID) | ORAL | 0 refills | Status: DC | PRN

## 2021-04-29 NOTE — Progress Notes (Signed)
[facility] [address] [place] [address] Dept: [phone] Dept Fax: [phone]  Office Visit  Subjective:    Patient ID: [name] [name], female    DOB: 08/08/1950, 70 y.o..   MRN: [id number]  Chief Complaint  Patient presents with   Acute Visit    C/o having RT ear feeing clogged and nausea x 1 week.  She has taken Musinex Sinus with no relief.       History of Present Illness:  Patient is in today for evaluation of a 1-week history of decreased hearing in her right ear. She also notes that she has the sound of  a chronic beeping in the ear. She has had some recurrent nausea and vomiting during this time. She also admits to some vertigo (room spinning) sensation. This comes on out of the blue and is not associated with positional changes. She had a stroke earlier this summer and admits she has not felt her best since then. She has a history of chronic allergic rhinitis and is on Flonase and antihistamines.  Past Medical History: Patient Active Problem List   Diagnosis Date Noted   Carotid artery stenosis 10/31/2020   Fever 10/29/2020   Bacteremia 10/27/2020   Macrocytic anemia 10/27/2020   NAFLD (nonalcoholic fatty liver disease) 09/21/2020   Stroke (cerebrum) ([place]) 07/25/2020   History of CVA (cerebrovascular accident) 07/24/2020   Thrombocytopenia ([place]) 07/24/2020   Chronic renal failure, stage 3a ([place]) 07/24/2020   Pressure injury of skin 07/24/2020   Stroke ([place]) 07/23/2020   Carnitine deficiency ([place]) 05/25/2018   Hypotension 12/30/2017   Bradycardia 12/30/2017   Deep venous thrombosis ([place]) left subclavian vein 07/31/2017   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[place]) 07/31/2017   Degenerative joint disease 07/08/2016   PICC line infection 05/16/2015   H/O mesenteric infarction 11/17/2013   Splenomegaly    Hx of adenomatous colonic polyps 13/24/4010   Chronic systolic heart failure ([place]) 12/25/2011   Osteoporosis  03/14/2010   Anemia of chronic disease 10/01/2009   GERD (gastroesophageal reflux disease) 11/19/2008   ALKALINE PHOSPHATASE, ELEVATED 10/12/2008   VITAMIN D DEFICIENCY 01/19/2008   TRANSAMINASES, SERUM, ELEVATED 01/19/2008   Bacterial overgrowth syndrome 11/19/2004   Acquired short bowel syndrome 11/20/2003   Past Surgical History:  Procedure Laterality Date   APPENDECTOMY  yrs ago   CHOLECYSTECTOMY  yrs ago   COLONOSCOPY  12/05/2005   internal hemorrhoids (for polyp surveillance)   COLONOSCOPY  04/26/2012   Procedure: COLONOSCOPY;  Surgeon: Gatha Mayer, MD;  Location: [facility];  Service: Endoscopy;  Laterality: N/A;   COLONOSCOPY WITH PROPOFOL N/A 03/18/2016   Procedure: COLONOSCOPY WITH PROPOFOL;  Surgeon: Gatha Mayer, MD;  Location: [facility];  Service: Endoscopy;  Laterality: N/A;   ESOPHAGOGASTRODUODENOSCOPY  01/22/2009   erosive esophagitis   HARDWARE REMOVAL Left 12/31/2017   Procedure: HARDWARE REMOVAL;  Surgeon: Rod Can, MD;  Location: [facility];  Service: Orthopedics;  Laterality: Left;   INTRAMEDULLARY (IM) NAIL INTERTROCHANTERIC Left 12/31/2017   Procedure: INTRAMEDULLARY (IM) NAIL SUBTROCHANTRIC;  Surgeon: Rod Can, MD;  Location: [facility];  Service: Orthopedics;  Laterality: Left;   IR CV LINE INJECTION  03/23/2018   IR FLUORO GUIDE CV LINE LEFT  01/01/2018   IR FLUORO GUIDE CV LINE LEFT  03/24/2018   IR FLUORO GUIDE CV LINE LEFT  05/21/2018   IR FLUORO GUIDE CV LINE LEFT  07/09/2018   IR FLUORO GUIDE CV LINE LEFT  12/28/2020   IR FLUORO GUIDE  CV LINE RIGHT  11/01/2020   IR GENERIC HISTORICAL  07/04/2016   IR REMOVAL TUN CV CATH W/O FL 07/04/2016 Ascencion Dike, PA-C [facility]   IR GENERIC HISTORICAL  07/10/2016   IR US GUIDE VASC ACCESS LEFT 07/10/2016 Arne Cleveland, MD [facility]   IR GENERIC HISTORICAL  07/10/2016   IR FLUORO GUIDE CV LINE LEFT 07/10/2016 Arne Cleveland, MD [facility]   IR PATIENT EVAL TECH 0-60 MINS  10/07/2017   IR PATIENT EVAL  TECH 0-60 MINS  03/09/2018   IR RADIOLOGIST EVAL & MGMT  07/21/2017   IR RADIOLOGIST EVAL & MGMT  11/14/2020   IR RADIOLOGIST EVAL & MGMT  11/27/2020   IR RADIOLOGIST EVAL & MGMT  01/24/2021   IR REMOVAL TUN CV CATH W/O FL  12/28/2020   IR TRANSCATH PLC STENT  INITIAL VEIN  INC ANGIOPLASTY  12/28/2020   IR US GUIDE VASC ACCESS LEFT  11/01/2020   IR US GUIDE VASC ACCESS LEFT  12/28/2020   IR US GUIDE VASC ACCESS LEFT  12/28/2020   IR US GUIDE VASC ACCESS LEFT  12/28/2020   IR US GUIDE VASC ACCESS RIGHT  11/01/2020   IR VENO/EXT/UNI LEFT  11/01/2020   IR VENOCAVAGRAM SVC  12/28/2020   LEFT HEART CATH AND CORONARY ANGIOGRAPHY N/A 06/26/2020   Procedure: LEFT HEART CATH AND CORONARY ANGIOGRAPHY;  Surgeon: Jolaine Artist, MD;  Location: [facility];  Service: Cardiovascular;  Laterality: N/A;   ORIF PROXIMAL FEMORAL FRACTURE W/ ITST NAIL SYSTEM  03/2007   left, Dr. Shellia Carwin   SMALL INTESTINE SURGERY  2005   multiple with right colon resection for ischemia/infarct   Family History  Problem Relation Age of Onset   Diabetes Mother    Hypertension Mother    AAA (abdominal aortic aneurysm) Mother    Colon cancer Neg Hx    Stomach cancer Neg Hx    Outpatient Medications Prior to Visit  Medication Sig Dispense Refill   acetaminophen (TYLENOL) 500 MG tablet Take 1,000 mg by mouth daily as needed for mild pain.     ADULT TPN Inject 1,800 mLs into the vein 4 (four) times a week. Pt receives home TPN from [facility]:  1800 mL bag, four nights weekly (Monday, Tuesday, Wednesday, Thursday for 8 hours (includes 1 hour taper up and down).     baclofen (LIORESAL) 10 MG tablet Take 1 tablet (10 mg total) by mouth 2 (two) times daily as needed for muscle spasms. 60 each 4   Calcium Carb-Cholecalciferol (CALCIUM + D3 PO) Take 2 tablets by mouth daily.     cetirizine (ZYRTEC) 10 MG tablet Take 1 tablet (10 mg total) by mouth at bedtime as needed for allergies. 90 tablet 1   diphenhydrAMINE (BENADRYL) 25 mg  capsule Take 25-50 mg by mouth every 6 (six) hours as needed for itching or sleep.      fluticasone (FLONASE) 50 MCG/ACT nasal spray Place 2 sprays into both nostrils daily as needed for allergies or rhinitis.     furosemide (LASIX) 20 MG tablet TAKE ONE TABLET BY MOUTH AS NEEDED FOR SWELLING 45 tablet 1   Heparin Sodium, Porcine, (HEPARIN LOCK FLUSH IJ) Inject 5 mLs as directed 4 (four) times a week. Monday, Tuesday, Wednesday, Thursday in the morning with TPN     losartan (COZAAR) 25 MG tablet Take 0.5 tablets (12.5 mg total) by mouth at bedtime. 90 tablet 3   Multiple Vitamins-Minerals (ADULT GUMMY PO) Take 2 tablets by  mouth daily.     omeprazole (PRILOSEC) 40 MG capsule Take 1 capsule (40 mg total) by mouth daily before breakfast. 90 capsule 1   ondansetron (ZOFRAN-ODT) 8 MG disintegrating tablet TAKE 1 TABLET (8 MG TOTAL) BY MOUTH EVERY 8 (EIGHT) HOURS AS NEEDED FOR NAUSEA OR VOMITING. 30 tablet 11   sodium chloride 0.9 % infusion Inject 900 mLs into the vein every 14 (fourteen) days. Uses for TPN     spironolactone (ALDACTONE) 25 MG tablet Take 0.5 tablets (12.5 mg total) by mouth daily. 30 tablet 2   Teduglutide, rDNA, 5 MG KIT Inject 3.1 application into the skin every morning.     triamcinolone cream (KENALOG) 0.1 % Apply 1 application topically as needed.     vitamin E 1000 UNIT capsule Take 1,000 Units by mouth daily.     XARELTO 20 MG TABS tablet TAKE 1 TABLET (20 MG TOTAL) BY MOUTH DAILY WITH SUPPER. 90 tablet 2   No facility-administered medications prior to visit.   Allergies  Allergen Reactions   Penicillins Itching, Swelling and Other (See Comments)    Reaction:  Facial swelling Has patient had a PCN reaction causing immediate rash, facial/tongue/throat swelling, SOB or lightheadedness with hypotension: Yes Has patient had a PCN reaction causing severe rash involving mucus membranes or skin necrosis: No Has patient had a PCN reaction that required hospitalization No Has  patient had a PCN reaction occurring within the last 10 years: No If all of the above answers are "NO", then may proceed with Cephalosporin use.     Objective:   Today's Vitals   04/29/21 1033  BP: 116/60  Pulse: 70  Temp: (!) 97.2 F (36.2 C)  TempSrc: Temporal  SpO2: 97%  Weight: 144 lb 12.8 oz (65.7 kg)  Height: $Remove'5\' 8"'[id number]$  (1.727 m)   Body mass index is 22.02 kg/m.   General: Well developed, well nourished. No acute distress. HEENT: Normocephalic, non-traumatic. PERRL, EOMI. No nystagmus. Conjunctiva clear. Left EAC is   impacted with wax. The right EAC and TM appears normal. The nasal mucosa is pale, but only mildly   congested without rhinorrhea. Mucous membranes moist. Oropharynx clear. Good dentition. Neck: Supple. No lymphadenopathy. No thyromegaly. Psych: Alert and oriented. Normal mood and affect.  Health Maintenance Due  Topic Date Due   Hepatitis C Screening  Never done   Zoster Vaccines- Shingrix (1 of 2) Never done   MAMMOGRAM  09/05/2013   COVID-19 Vaccine (3 - Booster for Pfizer series) 08/17/2019     Assessment & Plan:   1. Meniere disease, right Ms. P[place]bleton has decreased hearing, tinnitus, and vertigo. I suspect she may have Meniere disease. She is currently on furosemide and spironolactone related to having heart failure. I will add meclizine for vertigo. She already has Zofran available for nausea. I will refer her to ENT to evaluation to confirm if this could be Meniere's and discuss other possible treatment approaches.  - meclizine (ANTIVERT) 25 MG tablet; Take 1 tablet (25 mg total) by mouth 3 (three) times daily as needed for dizziness.  Dispense: 30 tablet; Refill: 0 - Ambulatory referral to ENT  Haydee Salter, MD

## 2021-04-29 NOTE — Patient Instructions (Signed)
Mnire's Disease Mnire's disease is an inner ear disorder. It causes recurrent attacks of a spinning sensation (vertigo), and ringing in the ear (tinnitus). It also causes hearing loss and a feeling of fullness or pressure in the ear. It typically affects one ear but can affect both. This is a lifelong condition, and it may get worse over time. There are treatment options to help manage the symptoms of Mnire's disease. What are the causes? This condition is caused by having too much of the fluid that is in the inner ear (endolymph). When fluid builds up in the inner ear, it affects the nerves that control balance and hearing. The reason for the fluid buildup is not known. Possible causes include: Allergies. An abnormal reaction of the body's defense system (autoimmune disease). Viral infection of the inner ear. Head injury. What increases the risk? The following factors may make you more likely to develop this condition: Being between 77 and 71 years old. Having a family history of Mnire's disease. Having a history of autoimmune disease. Having an injury to the head (head trauma). What are the signs or symptoms? Symptoms of this condition include: Fullness and pressure in your ear. Roaring or ringing in your ear (tinnitus). Vertigo and loss of balance. Decreased hearing. Nausea and vomiting. This is rare. Symptoms of this condition can come and go and may last for up to 4 or more hours at a time. Symptoms usually start in one ear. They may become more frequent and eventually involve both ears. How is this diagnosed? This condition is diagnosed based on a physical exam and tests. Tests may include: A hearing test (audiogram). An electronystagmogram (ENG). This tests your balance nerve (vestibular nerve). Imaging studies of your inner ear and hearing nerve, such as an MRI. Other balance tests, such as rotational or balance platform tests. How is this treated? There is no cure for  this condition, but treatment can help to manage your symptoms. Treatment may include: A low-salt (low-sodium) diet. This may help to reduce fluid in the body and relieve symptoms. Oral or injected medicines to reduce or control: Vertigo. Nausea. Fluid retention. Use of an air pressure pulse generator. This is a machine that sends small pressure pulses into your ear canal. Injections through the ear drum (tympanic membrane) to control vertigo symptoms. Hearing aids. Inner ear surgery. This is rare. Follow these instructions at home: Eating and drinking Avoid caffeine. Do not drink alcohol. Drink enough fluid to keep your urine pale yellow. Limit the sodium in your diet as told by your health care provider. This is usually no more than 1,500-2,000 mg per day. Check ingredients and nutrition facts on packaged foods and beverages. General instructions Take over-the-counter and prescription medicines only as told by your health care provider. Do not drive if you have vertigo or dizziness. Do not use any products that contain nicotine or tobacco. These products include cigarettes, chewing tobacco, and vaping devices, such as e-cigarettes. If you need help quitting, ask your health care provider. Keep all follow-up visits. This is important. Where to find more information To find more information, advice, and guidance, please see these online sites: American Academy of Otolaryngology-Head and Neck Surgery Foundation: www.enthealth.[place] on Deafness and Other Communication Disorders: FightListings.se [employer]: www.american-hearing.org Contact a health care provider if: You have symptoms that last longer than 4 hours. You have new or worse symptoms. Get help right away if: You have been vomiting for 24 hours. You cannot keep  fluids down. You have chest pain or trouble breathing. These symptoms may represent a serious problem that is an emergency.  Do not wait to see if the symptoms will go away. Get medical help right away. Call your local emergency services (911 in the [place] Do not drive yourself to the hospital. Summary Mnire's disease is an inner ear disorder. This condition causes recurrent attacks of a spinning sensation (vertigo), ringing in the ear (tinnitus), hearing loss, and ear fullness. Symptoms of this condition can come and go and may last for up to 4 or more hours at a time. Mnire's disease can be treated with a low-sodium diet, medicines, and sometimes, surgery. This information is not intended to replace advice given to you by your health care provider. Make sure you discuss any questions you have with your health care provider. Document Revised: 04/02/2020 Document Reviewed: 04/02/2020 Elsevier Patient Education  2022 [employer].

## 2021-05-12 HISTORY — PX: CATARACT EXTRACTION: SUR2

## 2021-05-14 ENCOUNTER — Ambulatory Visit (HOSPITAL_COMMUNITY): Payer: Medicare Other | Attending: Cardiovascular Disease

## 2021-05-14 ENCOUNTER — Other Ambulatory Visit: Payer: Self-pay

## 2021-05-14 DIAGNOSIS — R5383 Other fatigue: Secondary | ICD-10-CM | POA: Insufficient documentation

## 2021-05-14 DIAGNOSIS — I5022 Chronic systolic (congestive) heart failure: Secondary | ICD-10-CM | POA: Diagnosis not present

## 2021-05-14 LAB — ECHOCARDIOGRAM COMPLETE
AR max vel: 2.57 cm2
AV Area VTI: 2.39 cm2
AV Area mean vel: 2.42 cm2
AV Mean grad: 7.5 mmHg
AV Peak grad: 13.9 mmHg
Ao pk vel: 1.87 m/s
Area-P 1/2: 2.42 cm2
P 1/2 time: 436 msec
S' Lateral: 4.5 cm

## 2021-05-14 MED ORDER — PERFLUTREN LIPID MICROSPHERE
1.0000 mL | INTRAVENOUS | Status: AC | PRN
  Administered 2021-05-14: 2 mL via INTRAVENOUS

## 2021-05-15 ENCOUNTER — Ambulatory Visit
Admission: RE | Admit: 2021-05-15 | Discharge: 2021-05-15 | Disposition: A | Discharge status: Home / Self Care | Payer: Medicare Other | Source: Ambulatory Visit | Attending: Internal Medicine | Admitting: Internal Medicine

## 2021-05-15 DIAGNOSIS — R911 Solitary pulmonary nodule: Secondary | ICD-10-CM | POA: Diagnosis not present

## 2021-05-15 DIAGNOSIS — R918 Other nonspecific abnormal finding of lung field: Secondary | ICD-10-CM

## 2021-05-15 DIAGNOSIS — J439 Emphysema, unspecified: Secondary | ICD-10-CM | POA: Diagnosis not present

## 2021-05-17 ENCOUNTER — Other Ambulatory Visit: Payer: Self-pay

## 2021-05-17 DIAGNOSIS — R918 Other nonspecific abnormal finding of lung field: Secondary | ICD-10-CM

## 2021-05-24 ENCOUNTER — Other Ambulatory Visit (INDEPENDENT_AMBULATORY_CARE_PROVIDER_SITE_OTHER): Payer: Medicare Other

## 2021-05-24 DIAGNOSIS — D649 Anemia, unspecified: Secondary | ICD-10-CM | POA: Diagnosis not present

## 2021-05-24 DIAGNOSIS — K760 Fatty (change of) liver, not elsewhere classified: Secondary | ICD-10-CM | POA: Diagnosis not present

## 2021-05-24 DIAGNOSIS — K912 Postsurgical malabsorption, not elsewhere classified: Secondary | ICD-10-CM | POA: Diagnosis not present

## 2021-05-24 LAB — TRIGLYCERIDES: Triglycerides: 78 mg/dL (ref 0.0–149.0)

## 2021-05-24 LAB — COMPREHENSIVE METABOLIC PANEL
ALT: 10 U/L (ref 0–35)
AST: 14 U/L (ref 0–37)
Albumin: 3.1 g/dL — ABNORMAL LOW (ref 3.5–5.2)
Alkaline Phosphatase: 120 U/L — ABNORMAL HIGH (ref 39–117)
BUN: 30 mg/dL — ABNORMAL HIGH (ref 6–23)
CO2: 25 mEq/L (ref 19–32)
Calcium: 8.2 mg/dL — ABNORMAL LOW (ref 8.4–10.5)
Chloride: 105 mEq/L (ref 96–112)
Creatinine, Ser: 1.1 mg/dL (ref 0.40–1.20)
GFR: 50.71 mL/min — ABNORMAL LOW (ref >60.00)
Glucose, Bld: 126 mg/dL — ABNORMAL HIGH (ref 70–99)
Potassium: 4.6 mEq/L (ref 3.5–5.1)
Sodium: 136 mEq/L (ref 135–145)
Total Bilirubin: 0.4 mg/dL (ref 0.2–1.2)
Total Protein: 7.2 g/dL (ref 6.0–8.3)

## 2021-05-24 LAB — CBC WITH DIFFERENTIAL/PLATELET
Basophils Absolute: 0 10*3/uL (ref 0.0–0.1)
Basophils Relative: 0.5 % (ref 0.0–3.0)
Eosinophils Absolute: 0.3 10*3/uL (ref 0.0–0.7)
Eosinophils Relative: 6.5 % — ABNORMAL HIGH (ref 0.0–5.0)
HCT: 24.1 % — ABNORMAL LOW (ref 36.0–46.0)
Hemoglobin: 7.7 g/dL — CL (ref 12.0–15.0)
Lymphocytes Relative: 19 % (ref 12.0–46.0)
Lymphs Abs: 0.9 10*3/uL (ref 0.7–4.0)
MCHC: 31.8 g/dL (ref 30.0–36.0)
MCV: 92 fl (ref 78.0–100.0)
Monocytes Absolute: 0.6 10*3/uL (ref 0.1–1.0)
Monocytes Relative: 11.5 % (ref 3.0–12.0)
Neutro Abs: 3.1 10*3/uL (ref 1.4–7.7)
Neutrophils Relative %: 62.5 % (ref 43.0–77.0)
Platelets: 99 10*3/uL — ABNORMAL LOW (ref 150.0–400.0)
RBC: 2.63 Mil/uL — ABNORMAL LOW (ref 3.87–5.11)
RDW: 14.7 % (ref 11.5–15.5)
WBC: 4.9 10*3/uL (ref 4.0–10.5)

## 2021-05-24 LAB — MAGNESIUM: Magnesium: 2 mg/dL (ref 1.5–2.5)

## 2021-05-24 LAB — PHOSPHORUS: Phosphorus: 2.7 mg/dL (ref 2.3–4.6)

## 2021-05-25 LAB — PREALBUMIN: Prealbumin: 17 mg/dL (ref 17–34)

## 2021-06-11 ENCOUNTER — Other Ambulatory Visit (HOSPITAL_COMMUNITY): Payer: Self-pay

## 2021-06-11 ENCOUNTER — Encounter: Payer: Self-pay | Admitting: Hematology and Oncology

## 2021-06-11 ENCOUNTER — Other Ambulatory Visit: Payer: Self-pay

## 2021-06-11 ENCOUNTER — Other Ambulatory Visit (INDEPENDENT_AMBULATORY_CARE_PROVIDER_SITE_OTHER): Payer: Medicare Other

## 2021-06-11 ENCOUNTER — Other Ambulatory Visit: Payer: Self-pay | Admitting: Internal Medicine

## 2021-06-11 ENCOUNTER — Ambulatory Visit (HOSPITAL_COMMUNITY)
Admission: RE | Admit: 2021-06-11 | Discharge: 2021-06-11 | Disposition: A | Discharge status: Home / Self Care | Payer: Medicare Other | Source: Ambulatory Visit | Attending: Internal Medicine | Admitting: Internal Medicine

## 2021-06-11 ENCOUNTER — Encounter (HOSPITAL_COMMUNITY): Payer: Self-pay | Admitting: Internal Medicine

## 2021-06-11 VITALS — BP 120/60 | HR 63 | Wt 154.4 lb

## 2021-06-11 DIAGNOSIS — Z79899 Other long term (current) drug therapy: Secondary | ICD-10-CM | POA: Diagnosis not present

## 2021-06-11 DIAGNOSIS — Z7901 Long term (current) use of anticoagulants: Secondary | ICD-10-CM | POA: Insufficient documentation

## 2021-06-11 DIAGNOSIS — I350 Nonrheumatic aortic (valve) stenosis: Secondary | ICD-10-CM | POA: Insufficient documentation

## 2021-06-11 DIAGNOSIS — I447 Left bundle-branch block, unspecified: Secondary | ICD-10-CM | POA: Insufficient documentation

## 2021-06-11 DIAGNOSIS — Z8719 Personal history of other diseases of the digestive system: Secondary | ICD-10-CM

## 2021-06-11 DIAGNOSIS — R911 Solitary pulmonary nodule: Secondary | ICD-10-CM | POA: Diagnosis not present

## 2021-06-11 DIAGNOSIS — K912 Postsurgical malabsorption, not elsewhere classified: Secondary | ICD-10-CM | POA: Insufficient documentation

## 2021-06-11 DIAGNOSIS — Z8673 Personal history of transient ischemic attack (TIA), and cerebral infarction without residual deficits: Secondary | ICD-10-CM | POA: Insufficient documentation

## 2021-06-11 DIAGNOSIS — I2510 Atherosclerotic heart disease of native coronary artery without angina pectoris: Secondary | ICD-10-CM | POA: Insufficient documentation

## 2021-06-11 DIAGNOSIS — K760 Fatty (change of) liver, not elsewhere classified: Secondary | ICD-10-CM

## 2021-06-11 DIAGNOSIS — D649 Anemia, unspecified: Secondary | ICD-10-CM

## 2021-06-11 DIAGNOSIS — R6883 Chills (without fever): Secondary | ICD-10-CM | POA: Diagnosis not present

## 2021-06-11 DIAGNOSIS — R0602 Shortness of breath: Secondary | ICD-10-CM | POA: Diagnosis not present

## 2021-06-11 DIAGNOSIS — D6869 Other thrombophilia: Secondary | ICD-10-CM | POA: Insufficient documentation

## 2021-06-11 DIAGNOSIS — I5022 Chronic systolic (congestive) heart failure: Secondary | ICD-10-CM

## 2021-06-11 DIAGNOSIS — I517 Cardiomegaly: Secondary | ICD-10-CM | POA: Diagnosis not present

## 2021-06-11 LAB — CBC WITH DIFFERENTIAL/PLATELET
Basophils Absolute: 0 10*3/uL (ref 0.0–0.1)
Basophils Relative: 0.5 % (ref 0.0–3.0)
Eosinophils Absolute: 0.4 10*3/uL (ref 0.0–0.7)
Eosinophils Relative: 4.9 % (ref 0.0–5.0)
HCT: 24.9 % — ABNORMAL LOW (ref 36.0–46.0)
Hemoglobin: 8 g/dL — CL (ref 12.0–15.0)
Lymphocytes Relative: 20.2 % (ref 12.0–46.0)
Lymphs Abs: 1.7 10*3/uL (ref 0.7–4.0)
MCHC: 32 g/dL (ref 30.0–36.0)
MCV: 91.6 fl (ref 78.0–100.0)
Monocytes Absolute: 1 10*3/uL (ref 0.1–1.0)
Monocytes Relative: 11.3 % (ref 3.0–12.0)
Neutro Abs: 5.4 10*3/uL (ref 1.4–7.7)
Neutrophils Relative %: 63.1 % (ref 43.0–77.0)
Platelets: 94 10*3/uL — ABNORMAL LOW (ref 150.0–400.0)
RBC: 2.72 Mil/uL — ABNORMAL LOW (ref 3.87–5.11)
RDW: 15.1 % (ref 11.5–15.5)
WBC: 8.5 10*3/uL (ref 4.0–10.5)

## 2021-06-11 LAB — COMPREHENSIVE METABOLIC PANEL
ALT: 11 U/L (ref 0–35)
AST: 19 U/L (ref 0–37)
Albumin: 3.2 g/dL — ABNORMAL LOW (ref 3.5–5.2)
Alkaline Phosphatase: 121 U/L — ABNORMAL HIGH (ref 39–117)
BUN: 27 mg/dL — ABNORMAL HIGH (ref 6–23)
CO2: 23 mEq/L (ref 19–32)
Calcium: 8.2 mg/dL — ABNORMAL LOW (ref 8.4–10.5)
Chloride: 104 mEq/L (ref 96–112)
Creatinine, Ser: 1.24 mg/dL — ABNORMAL HIGH (ref 0.40–1.20)
GFR: 43.91 mL/min — ABNORMAL LOW (ref >60.00)
Glucose, Bld: 114 mg/dL — ABNORMAL HIGH (ref 70–99)
Potassium: 3.7 mEq/L (ref 3.5–5.1)
Sodium: 136 mEq/L (ref 135–145)
Total Bilirubin: 0.5 mg/dL (ref 0.2–1.2)
Total Protein: 7.5 g/dL (ref 6.0–8.3)

## 2021-06-11 LAB — MAGNESIUM: Magnesium: 2.1 mg/dL (ref 1.5–2.5)

## 2021-06-11 LAB — PHOSPHORUS: Phosphorus: 2.9 mg/dL (ref 2.3–4.6)

## 2021-06-11 LAB — TRIGLYCERIDES: Triglycerides: 92 mg/dL (ref 0.0–149.0)

## 2021-06-11 MED ORDER — EMPAGLIFLOZIN 10 MG PO TABS: 10.0000 mg | ORAL_TABLET | Freq: Every day | ORAL | 3 refills | Status: DC

## 2021-06-11 NOTE — Progress Notes (Signed)
ADVANCED HF CLINIC NOTE  Referring Physician: Dr. Johnsie Cancel Primary Care: None Primary Cardiologist: Dr. Johnsie Cancel  HPI:  [name] [name] is a 71 y.o. 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 Johnsie Cancel for further evalaution of her progressive cardiomyopathy.    Echo 2015  EF 45% with inferior wall and apical HK - she did not want to have heart cath. Myoview done at that time showed inferior/ apical scar with no ischemia   Echo 12/03/16 EF 40-45% AV sclerosis mean gradient 6 mmHg  She saw Dr. Johnsie Cancel 10/21. Echo 11/21 EF 20-25% mild AS mean gradient 10. 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 Deferred any venography intervention  Cath 2/22: EF 35-40% Mild CAD  (pLAD 30%, mLCX 30%, dRCA 30%)ejection fraction. EF 35-40% with inferoapical aneurysm suggestive of prior infarct. No RHC attempted due to known lack of venous access  Admitted 3/22 with acute CVA/TIA with left-sided weakness. MRI of the brain, numerous small scattered acute cortical and subcortical infarct within the right cerebral hemisphere, right MCA territory as well as ACA and PCA territory. CTA of the head and neck, no large vessel occlusion.  70% atheromatous stenosis at the right carotid bulb.  50% stenosis at the left carotid bulb.  Echo 07/24/20 EF 35-40% with apical HK  no bubble done.  cMRI 4/22  EF 30% Akinesis of the mid-apical inferior wall, apical septum and aneurysm of the LV apex. LGE findings suggest prior infarct with scar with no viability in the mid inferior wall and LV apex. 2.  RV normal EF 49% 3.  No LV apical thrombus.  Echo 1/23 EF 35-40% Mild AS/mild to  moderate AI  Here for f/u with her husband.  At last visit BP was low. We switched Entresto to losartan and cut spiro 25 -> 12.5. Overall has been doing pretty well. No more low BPs after the above change. Has been having some sinus drainage. Last night she got a chill and then became acutely SOB. No fevers. Bringing up some green phlegm. + LE edema. No CP. 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   Carnitine deficiency ([place]) 05/25/2018   Carotid stenosis    Carotid US (9/15):  R 40-59%; L 1-39% >> FU 1 year   Closed left subtrochanteric femur fracture ([facility]) 12/31/2017   Congestive heart failure (CHF) (HCC)    EF 25-30%   COVID-19 2022   Deep venous thrombosis ([place]) left subclavian vein 07/31/2017   Fracture of left clavicle    History of blood transfusion 2013   anemia   Hx of cardiovascular stress test    Myoview (9/15):  inf-apical scar; no ischemia; EF 47% - low risk    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 12/12/2011   Osteoporosis    Pancytopenia 10/07/2011   Pathologic fracture of neck of femur ([place])  Personal history of colonic polyps    Renal insufficiency    hx of yrs ago   Serratia marcescens infection - bactermia assoc w/ PICC 01/18/2015   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     ADULT TPN Inject 1,800 mLs into the vein 4 (four) times a week. Pt receives home TPN from [facility]:  1800 mL bag, four nights weekly (Monday,  Tuesday, Wednesday, Thursday for 8 hours (includes 1 hour taper up and down).     baclofen (LIORESAL) 10 MG tablet Take 1 tablet (10 mg total) by mouth 2 (two) times daily as needed for muscle spasms. 60 each 4   Calcium Carb-Cholecalciferol (CALCIUM + D3 PO) Take 2 tablets by mouth daily.     cetirizine (ZYRTEC) 10 MG tablet Take 1 tablet (10 mg total) by mouth at bedtime as needed for allergies. 90 tablet 1   diphenhydrAMINE (BENADRYL) 25 mg capsule Take 25-50 mg by mouth every 6 (six) hours as needed for itching or sleep.      fluticasone (FLONASE) 50 MCG/ACT nasal spray Place 2 sprays into both nostrils daily as needed for allergies or rhinitis.     furosemide (LASIX) 20 MG tablet TAKE ONE TABLET BY MOUTH AS NEEDED FOR SWELLING 45 tablet 1   Heparin Sodium, Porcine, (HEPARIN LOCK FLUSH IJ) Inject 5 mLs as directed 4 (four) times a week. Monday, Tuesday, Wednesday, Thursday in the morning with TPN     losartan (COZAAR) 25 MG tablet Take 0.5 tablets (12.5 mg total) by mouth at bedtime. 90 tablet 3   meclizine (ANTIVERT) 25 MG tablet Take 1 tablet (25 mg total) by mouth 3 (three) times daily as needed for dizziness. 30 tablet 0   Multiple Vitamins-Minerals (ADULT GUMMY PO) Take 2 tablets by mouth daily.     omeprazole (PRILOSEC) 40 MG capsule Take 1 capsule (40 mg total) by mouth daily before breakfast. 90 capsule 1   ondansetron (ZOFRAN-ODT) 8 MG disintegrating tablet TAKE 1 TABLET (8 MG TOTAL) BY MOUTH EVERY 8 (EIGHT) HOURS AS NEEDED FOR NAUSEA OR VOMITING. 30 tablet 11   sodium chloride 0.9 % infusion Inject 900 mLs into the vein every 14 (fourteen) days. Uses for TPN     spironolactone (ALDACTONE) 25 MG tablet Take 0.5 tablets (12.5 mg total) by mouth daily. 30 tablet 2   Teduglutide, rDNA, 5 MG KIT Inject 3.1 application into the skin every morning.     triamcinolone cream (KENALOG) 0.1 % Apply 1 application topically as needed.     vitamin E 1000 UNIT capsule Take 1,000 Units by mouth daily.      XARELTO 20 MG TABS tablet TAKE 1 TABLET (20 MG TOTAL) BY MOUTH DAILY WITH SUPPER. 90 tablet 2   No current facility-administered medications for this encounter.    Allergies  Allergen Reactions   Penicillins Itching, Swelling and Other (See Comments)    Reaction:  Facial swelling Has patient had a PCN reaction causing immediate rash, facial/tongue/throat swelling, SOB or lightheadedness with hypotension: Yes Has patient had a PCN reaction causing severe rash involving mucus membranes or skin necrosis: No Has patient  had a PCN reaction that required hospitalization No Has patient had a PCN reaction occurring within the last 10 years: No If all of the above answers are "NO", then may proceed with Cephalosporin use.      Social History   Socioeconomic History   Marital status: Married    Spouse name: Not on file   Number of children: 1   Years of education: Not on file   Highest education level: Not on file  Occupational History   Not on file  Tobacco Use   Smoking status: Former    Types: Cigarettes    Quit date: 05/12/2004    Years since quitting: 17.0   Smokeless tobacco: Never  Vaping Use   Vaping Use: Never used  Substance and Sexual Activity   Alcohol use: No   Drug use: No   Sexual activity: Yes    Comment: 1st intercourse 68 yo-5 partners  Other Topics Concern   Not on file  Social History Narrative   Married to [place], has 1 daughter and a granddaughter the patient's son died in childhood due to a motor vehicle wreck   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   No alcohol tobacco or drug use   04/07/2017   Social Determinants of Health   Financial Resource Strain: Not on file  Food Insecurity: Not on file  Transportation Needs: Not on file  Physical Activity: Not on file  Stress: Not on file  Social Connections: Not on file  Intimate Partner Violence: Not on file      Family History  Problem Relation Age of  Onset   Diabetes Mother    Hypertension Mother    AAA (abdominal aortic aneurysm) Mother    Colon cancer Neg Hx    Stomach cancer Neg Hx     Vitals:   06/11/21 1039  BP: 120/60  Pulse: 63  SpO2: 99%  Weight: 70 kg (154 lb 6.4 oz)   Wt Readings from Last 3 Encounters:  06/11/21 70 kg (154 lb 6.4 oz)  04/29/21 65.7 kg (144 lb 12.8 oz)  04/25/21 66.7 kg (147 lb)     PHYSICAL EXAM: General:  Well appearing. No resp difficulty HEENT: normal Neck: supple. no JVD. Carotids 2+ bilat; no bruits. No lymphadenopathy or thryomegaly appreciated. Cor: PMI nondisplaced. Regular rate & rhythm. 2/6 AS Indwelling cath in left chest  Lungs: clear Abdomen: soft, nontender, nondistended. No hepatosplenomegaly. No bruits or masses. Good bowel sounds. Extremities: no cyanosis, clubbing, rash, 1+ edema Neuro: alert & orientedx3, cranial nerves grossly intact. moves all 4 extremities w/o difficulty. Affect pleasant     ASSESSMENT & PLAN:  1. Systolic HF due to Ischemic cardiomyopathy - EF 45% echo 2015 RWMA inferior wall and apex she did not want to have heart cath.  - Myoview 2015  EF 47% inferior/ apical scar with no ischemia - Echo 12/03/16 EF 40-45% AV sclerosis mean gradient 6 mmHg - Echo 11/21 EF 20-25% mild AS mean gradient 10  - Cath 06/26/20: EF 35-40% Mild CAD  (pLAD 30%, mLCX 30%, dRCA 30%)ejection fraction. EF 35-40% with inferoapical aneurysm suggestive of prior infarct (depsite lack of significant CAD). No RHC attempted due to known lack of venous access - Echo 07/24/20 EF 35-40% with apical HK  no bubble done. Mild AS Personally reviewed - cMRI 4/22 EF 30% Akinesis of the mid-apical inferior wall, apical septum and aneurysm of the LV apex. LGE findings suggest prior infarct with  scar with no viability in the mid inferior wall and LV apex. RV normal  - Stable NYHA III  - Volume seems a bit up on exam,. Confirmed with ReDS 37% Take one dose lasix today then switch to Jardiance 10 mg  daily - Failed Entresto due to low BP. Continue Losartan 12.74m qhs - Not on b-blocker due to bradycardia - Continue spiro 12.5  = Start Jardiance 10 - She has large infero-apical aneurysm in the absence of significant epicardial CAD. MRI confirms previous infarct. Possible thrombotic episode. Doubt she has LBBB cardiomyopathy as imaging doesn't match this  Likely not candidate for CRT-D due to diffuse venous occlusions and also high-risk of infection with ongoing TPN.  2. 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 Follows with Dr. NJohnsie Cancel- Start Jardiance  3. Hypercoaguable state - chronically occluded RIJ - h/o mesenteric infarct  - on Xarelto. No bleeding   4. 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 Has limited venous access  5. LBBB (1512m - as above. Doubt she qualifies for CRT  6. Aortic stenosis - mild by echo mean gradient 15 - Will follow with routine echos  7. H/o CVA - follows with Dr. SeLeonie Man- consider statin   8. Chills - [facility]  to further check given indwelling line   DaGlori BickersMD  11:05 AM

## 2021-06-11 NOTE — Progress Notes (Signed)
ReDS Vest / Clip - 06/11/21 1200       ReDS Vest / Clip   Station Marker C    Ruler Value 27    ReDS Value Range Moderate volume overload    ReDS Actual Value 37

## 2021-06-11 NOTE — Patient Instructions (Addendum)
Medication Changes:  Start Jardiance 10mg  Daily  Lab Work:  Please go to [facility] and get a CBC and 1 set of Blood Cultures Drawn. Order is in the system for them  Testing/Procedures:  Your physician has requested that you have an echocardiogram. Echocardiography is a painless test that uses sound waves to create images of your heart. It provides your doctor with information about the size and shape of your heart and how well your hearts chambers and valves are working. This procedure takes approximately one hour. There are no restrictions for this procedure.   Referrals:  none  Special Instructions // Education:  none  Follow-Up in: 6 months (July 2023) ** Please call the office in May to make your appointment**  At the [facility], you and your health needs are our priority. We have a designated team specialized in the treatment of Heart Failure. This Care Team includes your primary Heart Failure Specialized Cardiologist (physician), Advanced Practice Providers (APPs- Physician Assistants and Nurse Practitioners), and Pharmacist who all work together to provide you with the care you need, when you need it.   You may see any of the following providers on your designated Care Team at your next follow up:  Dr Glori Bickers Dr Haynes Kerns, NP Lyda Jester, [place] [facility] [place], [place] Audry Riles, PharmD   Please be sure to bring in all your medications bottles to every appointment.   Need to Contact us:  If you have any questions or concerns before your next appointment please send [place] a message through [place] or call our office at [phone].    TO LEAVE A MESSAGE FOR THE NURSE SELECT OPTION 2, PLEASE LEAVE A MESSAGE INCLUDING: YOUR NAME DATE OF BIRTH CALL BACK NUMBER REASON FOR CALL**this is important as we prioritize the call backs  YOU WILL RECEIVE A CALL BACK THE SAME DAY AS LONG AS YOU CALL BEFORE 4:00 PM

## 2021-06-12 LAB — PREALBUMIN: Prealbumin: 14 mg/dL — ABNORMAL LOW (ref 17–34)

## 2021-06-14 DIAGNOSIS — H25813 Combined forms of age-related cataract, bilateral: Secondary | ICD-10-CM | POA: Diagnosis not present

## 2021-06-14 DIAGNOSIS — H52203 Unspecified astigmatism, bilateral: Secondary | ICD-10-CM | POA: Diagnosis not present

## 2021-06-14 DIAGNOSIS — H5213 Myopia, bilateral: Secondary | ICD-10-CM | POA: Diagnosis not present

## 2021-06-17 LAB — CBC
Hematocrit: 23.5 % — ABNORMAL LOW (ref 34.0–46.6)
Hemoglobin: 7.6 g/dL — ABNORMAL LOW (ref 11.1–15.9)
MCH: 29.3 pg (ref 26.6–33.0)
MCHC: 32.3 g/dL (ref 31.5–35.7)
MCV: 91 fL (ref 79–97)
Platelets: 99 10*3/uL — CL (ref 150–450)
RBC: 2.59 x10E6/uL — CL (ref 3.77–5.28)
RDW: 13.3 % (ref 11.7–15.4)
WBC: 7 10*3/uL (ref 3.4–10.8)

## 2021-06-17 LAB — CULTURE, BLOOD (SINGLE)

## 2021-06-18 ENCOUNTER — Telehealth (HOSPITAL_COMMUNITY): Payer: Self-pay | Admitting: Cardiology

## 2021-06-18 NOTE — Telephone Encounter (Signed)
Abnormal labs received Hg 7.6 Hct 23.5 RBC 2.59  Per Allena Katz, NP Hgb remains low but at baseline, monitor s/s for bleeding Needs followup with GI/heme    Pt aware of results Denies s/s of bleeding, aware to call hematology  Pt reports she has follow up with Dr [place]-06/21/21

## 2021-06-19 ENCOUNTER — Ambulatory Visit (INDEPENDENT_AMBULATORY_CARE_PROVIDER_SITE_OTHER): Payer: Medicare Other | Admitting: Pulmonary Disease

## 2021-06-19 ENCOUNTER — Other Ambulatory Visit: Payer: Self-pay

## 2021-06-19 ENCOUNTER — Encounter: Payer: Self-pay | Admitting: Pulmonary Disease

## 2021-06-19 VITALS — BP 116/64 | HR 70 | Temp 97.7°F | Ht 68.0 in | Wt 156.8 lb

## 2021-06-19 DIAGNOSIS — R918 Other nonspecific abnormal finding of lung field: Secondary | ICD-10-CM

## 2021-06-19 DIAGNOSIS — Z87891 Personal history of nicotine dependence: Secondary | ICD-10-CM

## 2021-06-19 NOTE — Progress Notes (Signed)
Synopsis: Referred in February 2023 for bilateral pulmonary nodules by [name], MD  Subjective:   PATIENT ID: [name] [name] GENDER: female DOB: 12-Jun-1950, MRN: [id number]  Chief Complaint  Patient presents with   Consult    This is a 71 year old female, past medical history of small bowel resection, nutritional deficiencies, history of congestive heart failure history of DVT, history of bacterial overgrowth, ultimately ended up on TPN and follows with Dr. Carlean Purl from GI.  Patient had CT imaging of the chest which showed pulmonary nodules a year ago.  Had follow-up CT imaging which show stability however she has a new wound within the left lower lobe 7 mm in size.  Patient has a longstanding history of smoking she quit several years ago but carries at least a 40+ pack year history.  Denies any other respiratory related symptoms at this time.   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   Fracture of left clavicle    History of blood transfusion 2013   anemia   Hx of cardiovascular stress test    Myoview (9/15):  inf-apical scar; no ischemia; EF 47% - low risk    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 12/12/2011   Osteoporosis    Pancytopenia 10/07/2011   Pathologic fracture of neck of femur ([place])    Personal history of colonic polyps    Renal insufficiency    hx of yrs ago   Serratia marcescens infection - bactermia  assoc w/ PICC 01/18/2015   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)    Vitamin D deficiency      Family History  Problem Relation Age of Onset   Diabetes Mother    Hypertension Mother    AAA (abdominal aortic aneurysm) Mother    Colon cancer Neg Hx    Stomach cancer Neg Hx      Past Surgical History:  Procedure Laterality Date   APPENDECTOMY  yrs ago   CHOLECYSTECTOMY  yrs ago   COLONOSCOPY  12/05/2005   internal hemorrhoids (for polyp surveillance)   COLONOSCOPY  04/26/2012   Procedure: COLONOSCOPY;  Surgeon: Gatha Mayer, MD;  Location: [facility];  Service: Endoscopy;  Laterality: N/A;   COLONOSCOPY WITH PROPOFOL N/A 03/18/2016   Procedure: COLONOSCOPY WITH PROPOFOL;  Surgeon: Gatha Mayer, MD;  Location: [facility];  Service: Endoscopy;  Laterality: N/A;   ESOPHAGOGASTRODUODENOSCOPY  01/22/2009   erosive esophagitis   HARDWARE REMOVAL Left 12/31/2017   Procedure: HARDWARE REMOVAL;  Surgeon: Rod Can, MD;  Location: [facility];  Service: Orthopedics;  Laterality: Left;   INTRAMEDULLARY (IM) NAIL INTERTROCHANTERIC Left 12/31/2017   Procedure: INTRAMEDULLARY (IM) NAIL SUBTROCHANTRIC;  Surgeon: Rod Can, MD;  Location:  [facility];  Service: Orthopedics;  Laterality: Left;   IR CV LINE INJECTION  03/23/2018   IR FLUORO GUIDE CV LINE LEFT  01/01/2018   IR FLUORO GUIDE CV LINE LEFT  03/24/2018   IR FLUORO GUIDE CV LINE LEFT  05/21/2018   IR FLUORO GUIDE CV LINE LEFT  07/09/2018   IR FLUORO GUIDE CV LINE LEFT  12/28/2020   IR FLUORO GUIDE CV LINE RIGHT  11/01/2020   IR GENERIC HISTORICAL  07/04/2016   IR REMOVAL TUN CV CATH W/O FL 07/04/2016 Ascencion Dike, PA-C [facility]   IR GENERIC HISTORICAL  07/10/2016   IR US GUIDE VASC ACCESS LEFT 07/10/2016 Arne [place], MD [facility]   IR GENERIC HISTORICAL  07/10/2016   IR FLUORO GUIDE CV LINE LEFT 07/10/2016  Arne [place], MD [facility]   IR PATIENT EVAL TECH 0-60 MINS  10/07/2017   IR PATIENT EVAL TECH 0-60 MINS  03/09/2018   IR RADIOLOGIST EVAL & MGMT  07/21/2017   IR RADIOLOGIST EVAL & MGMT  11/14/2020   IR RADIOLOGIST EVAL & MGMT  11/27/2020   IR RADIOLOGIST EVAL & MGMT  01/24/2021   IR REMOVAL TUN CV CATH W/O FL  12/28/2020   IR TRANSCATH PLC STENT  INITIAL VEIN  INC ANGIOPLASTY  12/28/2020   IR US GUIDE VASC ACCESS LEFT  11/01/2020   IR US GUIDE VASC ACCESS LEFT  12/28/2020   IR US GUIDE VASC ACCESS LEFT  12/28/2020   IR US GUIDE VASC ACCESS LEFT  12/28/2020   IR US GUIDE VASC ACCESS RIGHT  11/01/2020   IR VENO/EXT/UNI LEFT  11/01/2020   IR VENOCAVAGRAM SVC  12/28/2020   LEFT HEART CATH AND CORONARY ANGIOGRAPHY N/A 06/26/2020   Procedure: LEFT HEART CATH AND CORONARY ANGIOGRAPHY;  Surgeon: Jolaine Artist, MD;  Location: [facility];  Service: Cardiovascular;  Laterality: N/A;   ORIF PROXIMAL FEMORAL FRACTURE W/ ITST NAIL SYSTEM  03/2007   left, Dr. Shellia Carwin   SMALL INTESTINE SURGERY  2005   multiple with right colon resection for ischemia/infarct    Social History   Socioeconomic History   Marital status: Married    Spouse name: Not on file   Number of children: 1   Years of education: Not on file   Highest education level: Not on file  Occupational History   Not on file  Tobacco Use   Smoking status: Former    Types: Cigarettes    Quit date: 05/12/2004    Years since quitting: 17.1   Smokeless tobacco: Never  Vaping Use   Vaping Use: Never used  Substance and Sexual Activity   Alcohol use: No   Drug use: No   Sexual activity: Yes    Comment: 1st intercourse 56 yo-5 partners  Other Topics Concern   Not on file  Social History Narrative   Married to 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   No alcohol tobacco or drug  use   04/07/2017   Social Determinants of Health   Financial Resource Strain: Not on file  Food Insecurity: Not on file  Transportation Needs: Not on file  Physical Activity: Not on file  Stress: Not on file  Social Connections: Not on file  Intimate Partner Violence: Not on file     Allergies  Allergen Reactions   Penicillins  Itching, Swelling and Other (See Comments)    Reaction:  Facial swelling Has patient had a PCN reaction causing immediate rash, facial/tongue/throat swelling, SOB or lightheadedness with hypotension: Yes Has patient had a PCN reaction causing severe rash involving mucus membranes or skin necrosis: No Has patient had a PCN reaction that required hospitalization No Has patient had a PCN reaction occurring within the last 10 years: No If all of the above answers are "NO", then may proceed with Cephalosporin use.     Outpatient Medications Prior to Visit  Medication Sig Dispense Refill   acetaminophen (TYLENOL) 500 MG tablet Take 1,000 mg by mouth daily as needed for mild pain.     ADULT TPN Inject 1,800 mLs into the vein 4 (four) times a week. Pt receives home TPN from [facility]:  1800 mL bag, four nights weekly (Monday, Tuesday, Wednesday, Thursday for 8 hours (includes 1 hour taper up and down).     baclofen (LIORESAL) 10 MG tablet Take 1 tablet (10 mg total) by mouth 2 (two) times daily as needed for muscle spasms. 60 each 4   Calcium Carb-Cholecalciferol (CALCIUM + D3 PO) Take 2 tablets by mouth daily.     cetirizine (ZYRTEC) 10 MG tablet Take 1 tablet (10 mg total) by mouth at bedtime as needed for allergies. 90 tablet 1   diphenhydrAMINE (BENADRYL) 25 mg capsule Take 25-50 mg by mouth every 6 (six) hours as needed for itching or sleep.      empagliflozin (JARDIANCE) 10 MG TABS tablet Take 1 tablet (10 mg total) by mouth daily before breakfast. 90 tablet 3   fluticasone (FLONASE) 50 MCG/ACT nasal spray Place 2 sprays into both nostrils daily as needed for  allergies or rhinitis.     furosemide (LASIX) 20 MG tablet TAKE ONE TABLET BY MOUTH AS NEEDED FOR SWELLING 45 tablet 1   Heparin Sodium, Porcine, (HEPARIN LOCK FLUSH IJ) Inject 5 mLs as directed 4 (four) times a week. Monday, Tuesday, Wednesday, Thursday in the morning with TPN     losartan (COZAAR) 25 MG tablet Take 0.5 tablets (12.5 mg total) by mouth at bedtime. 90 tablet 3   meclizine (ANTIVERT) 25 MG tablet Take 1 tablet (25 mg total) by mouth 3 (three) times daily as needed for dizziness. 30 tablet 0   Multiple Vitamins-Minerals (ADULT GUMMY PO) Take 2 tablets by mouth daily.     omeprazole (PRILOSEC) 40 MG capsule Take 1 capsule (40 mg total) by mouth daily before breakfast. 90 capsule 1   ondansetron (ZOFRAN-ODT) 8 MG disintegrating tablet TAKE 1 TABLET (8 MG TOTAL) BY MOUTH EVERY 8 (EIGHT) HOURS AS NEEDED FOR NAUSEA OR VOMITING. 30 tablet 11   sodium chloride 0.9 % infusion Inject 900 mLs into the vein every 14 (fourteen) days. Uses for TPN     spironolactone (ALDACTONE) 25 MG tablet Take 0.5 tablets (12.5 mg total) by mouth daily. 30 tablet 2   Teduglutide, rDNA, 5 MG KIT Inject 3.1 application into the skin every morning.     triamcinolone cream (KENALOG) 0.1 % Apply 1 application topically as needed.     vitamin E 1000 UNIT capsule Take 1,000 Units by mouth daily.     XARELTO 20 MG TABS tablet TAKE 1 TABLET (20 MG TOTAL) BY MOUTH DAILY WITH SUPPER. 90 tablet 2   No facility-administered medications prior to visit.    Review of Systems  Constitutional:  Negative for chills, fever, malaise/fatigue and weight loss.  HENT:  Negative for hearing  loss, sore throat and tinnitus.   Eyes:  Negative for blurred vision and double vision.  Respiratory:  Negative for cough, hemoptysis, sputum production, shortness of breath, wheezing and stridor.   Cardiovascular:  Negative for chest pain, palpitations, orthopnea, leg swelling and PND.  Gastrointestinal:  Negative for abdominal pain,  constipation, diarrhea, heartburn, nausea and vomiting.  Genitourinary:  Negative for dysuria, hematuria and urgency.  Musculoskeletal:  Negative for joint pain and myalgias.  Skin:  Negative for itching and rash.  Neurological:  Negative for dizziness, tingling, weakness and headaches.  Endo/Heme/Allergies:  Negative for environmental allergies. Does not bruise/bleed easily.  Psychiatric/Behavioral:  Negative for depression. The patient is not nervous/anxious and does not have insomnia.   All other systems reviewed and are negative.   Objective:  Physical Exam Vitals reviewed.  Constitutional:      General: She is not in acute distress.    Appearance: She is well-developed.     Comments: Elderly, thin  HENT:     Head: Normocephalic and atraumatic.  Eyes:     General: No scleral icterus.    Conjunctiva/sclera: Conjunctivae normal.     Pupils: Pupils are equal, round, and reactive to light.  Neck:     Vascular: No JVD.     Trachea: No tracheal deviation.  Cardiovascular:     Rate and Rhythm: Normal rate and regular rhythm.     Heart sounds: No murmur heard.    Comments: Left chest with PICC line Pulmonary:     Effort: Pulmonary effort is normal. No tachypnea, accessory muscle usage or respiratory distress.     Breath sounds: No stridor. No wheezing, rhonchi or rales.  Abdominal:     General: Bowel sounds are normal. There is no distension.     Palpations: Abdomen is soft.     Tenderness: There is no abdominal tenderness.  Musculoskeletal:        General: No tenderness.     Cervical back: Neck supple.  Lymphadenopathy:     Cervical: No cervical adenopathy.  Skin:    General: Skin is warm and dry.     Capillary Refill: Capillary refill takes less than 2 seconds.     Findings: No rash.  Neurological:     Mental Status: She is alert and oriented to person, place, and time.  Psychiatric:        Behavior: Behavior normal.     Vitals:   06/19/21 1426  BP: 116/64   Pulse: 70  Temp: 97.7 F (36.5 C)  TempSrc: Oral  SpO2: 100%  Weight: 156 lb 12.8 oz (71.1 kg)  Height: _0  (1.727 m)   100% on RA BMI Readings from Last 3 Encounters:  06/19/21 23.84 kg/m  06/11/21 23.48 kg/m  04/29/21 22.02 kg/m   Wt Readings from Last 3 Encounters:  06/19/21 156 lb 12.8 oz (71.1 kg)  06/11/21 154 lb 6.4 oz (70 kg)  04/29/21 144 lb 12.8 oz (65.7 kg)     CBC    Component Value Date/Time   WBC 7.0 06/11/2021 1458   WBC 8.5 06/11/2021 1226   RBC 2.59 (LL) 06/11/2021 1458   RBC 2.72 (L) 06/11/2021 1226   HGB 7.6 (L) 06/11/2021 1458   HGB 8.0 Repeated and verified X2. (LL) 06/11/2021 1226   HGB 9.9 (L) 05/02/2014 1200   HCT 23.5 (L) 06/11/2021 1458   HCT 24.9 (L) 06/11/2021 1226   HCT 30.8 (L) 05/02/2014 1200   PLT 99 (LL) 06/11/2021 1458   PLT  94.0 (L) 06/11/2021 1226   MCV 91 06/11/2021 1458   MCV 91.6 06/11/2021 1226   MCV 96.3 05/02/2014 1200   MCH 29.3 06/11/2021 1458   MCH 30.1 12/19/2020 0901   MCHC 32.3 06/11/2021 1458   MCHC 32.0 06/11/2021 1226   RDW 13.3 06/11/2021 1458   RDW 15.1 06/11/2021 1226   RDW 14.2 05/02/2014 1200   LYMPHSABS 1.7 06/11/2021 1226   LYMPHSABS 1.9 05/02/2014 1200   MONOABS 1.0 06/11/2021 1226   MONOABS 0.5 05/02/2014 1200   EOSABS 0.4 06/11/2021 1226   EOSABS 0.2 05/02/2014 1200   EOSABS 0.4 03/05/2010 0857   BASOSABS 0.0 06/11/2021 1226   BASOSABS 0.0 05/02/2014 1200    Chest Imaging: CT chest January 2023: Reveals numerous scattered pulmonary nodules within the chest.  Also has a patulous esophagus concerning for esophageal dysmotility.  These nodules could represent aspiration.  She does however have a new 7 mm left lower lobe pulmonary nodule. The patient's images have been independently reviewed by me.    Pulmonary Functions Testing Results: No flowsheet data found.  FeNO:   Pathology:   Echocardiogram:   Heart Catheterization:     Assessment & Plan:     ICD-10-CM   1. Multiple lung  nodules  R91.8 CT Chest Wo Contrast    2. Former smoker  Z87.891       Discussion:  This is a 71 year old female, history of small bowel removal, nutritional deficiencies on TPN for poor absorption, has a PICC line chronically in the left chest now with multiple pulmonary nodules.  She is a former smoker with 40+ years of cigarette use  Plan: She needs a repeat 52-monthnoncontrasted CT scan of the chest for follow-up of the 7 mm new left lower lobe pulmonary nodule. She has other scattered nodules in the chest which I expect are inflammatory in nature. Other differential diagnosis would be considerations for things like infectious causes of multiple pulmonary nodules with a PICC line in place concern would be obvious hematogenous seeding or consideration for an infective endocarditis however the patient has no symptoms of bloodstream infection at this time and has been afebrile.   If the patient develops any of these symptoms I think that we should further look into this but at this time I do not believe that is what is causing her nodules. She does have a patulous esophagus which may represent esophageal dysmotility and her risk for aspiration especially at night.  Multiple pulmonary nodules in this array on her CT would be consistent with aspiration.  Due to her smoking history she needs a repeat CT of the left lower lobe nodule which I think is the most concerning for a risk at being a malignancy  Repeat noncontrasted CT chest in 6 months and follow-up with [place].   Current Outpatient Medications:    acetaminophen (TYLENOL) 500 MG tablet, Take 1,000 mg by mouth daily as needed for mild pain., Disp: , Rfl:    ADULT TPN, Inject 1,800 mLs into the vein 4 (four) times a week. Pt receives home TPN from [facility]:  1800 mL bag, four nights weekly (Monday, Tuesday, Wednesday, Thursday for 8 hours (includes 1 hour taper up and down)., Disp: , Rfl:    baclofen (LIORESAL) 10 MG tablet,  Take 1 tablet (10 mg total) by mouth 2 (two) times daily as needed for muscle spasms., Disp: 60 each, Rfl: 4   Calcium Carb-Cholecalciferol (CALCIUM + D3 PO), Take 2 tablets by  mouth daily., Disp: , Rfl:    cetirizine (ZYRTEC) 10 MG tablet, Take 1 tablet (10 mg total) by mouth at bedtime as needed for allergies., Disp: 90 tablet, Rfl: 1   diphenhydrAMINE (BENADRYL) 25 mg capsule, Take 25-50 mg by mouth every 6 (six) hours as needed for itching or sleep. , Disp: , Rfl:    empagliflozin (JARDIANCE) 10 MG TABS tablet, Take 1 tablet (10 mg total) by mouth daily before breakfast., Disp: 90 tablet, Rfl: 3   fluticasone (FLONASE) 50 MCG/ACT nasal spray, Place 2 sprays into both nostrils daily as needed for allergies or rhinitis., Disp: , Rfl:    furosemide (LASIX) 20 MG tablet, TAKE ONE TABLET BY MOUTH AS NEEDED FOR SWELLING, Disp: 45 tablet, Rfl: 1   Heparin Sodium, Porcine, (HEPARIN LOCK FLUSH IJ), Inject 5 mLs as directed 4 (four) times a week. Monday, Tuesday, Wednesday, Thursday in the morning with TPN, Disp: , Rfl:    losartan (COZAAR) 25 MG tablet, Take 0.5 tablets (12.5 mg total) by mouth at bedtime., Disp: 90 tablet, Rfl: 3   meclizine (ANTIVERT) 25 MG tablet, Take 1 tablet (25 mg total) by mouth 3 (three) times daily as needed for dizziness., Disp: 30 tablet, Rfl: 0   Multiple Vitamins-Minerals (ADULT GUMMY PO), Take 2 tablets by mouth daily., Disp: , Rfl:    omeprazole (PRILOSEC) 40 MG capsule, Take 1 capsule (40 mg total) by mouth daily before breakfast., Disp: 90 capsule, Rfl: 1   ondansetron (ZOFRAN-ODT) 8 MG disintegrating tablet, TAKE 1 TABLET (8 MG TOTAL) BY MOUTH EVERY 8 (EIGHT) HOURS AS NEEDED FOR NAUSEA OR VOMITING., Disp: 30 tablet, Rfl: 11   sodium chloride 0.9 % infusion, Inject 900 mLs into the vein every 14 (fourteen) days. Uses for TPN, Disp: , Rfl:    spironolactone (ALDACTONE) 25 MG tablet, Take 0.5 tablets (12.5 mg total) by mouth daily., Disp: 30 tablet, Rfl: 2   Teduglutide,  rDNA, 5 MG KIT, Inject 3.1 application into the skin every morning., Disp: , Rfl:    triamcinolone cream (KENALOG) 0.1 %, Apply 1 application topically as needed., Disp: , Rfl:    vitamin E 1000 UNIT capsule, Take 1,000 Units by mouth daily., Disp: , Rfl:    XARELTO 20 MG TABS tablet, TAKE 1 TABLET (20 MG TOTAL) BY MOUTH DAILY WITH SUPPER., Disp: 90 tablet, Rfl: 2   Garner Nash, DO [facility] 06/19/2021 2:47 PM

## 2021-06-19 NOTE — Patient Instructions (Signed)
Thank you for visiting Dr. Valeta Harms at [facility]. Today we recommend the following:  Orders Placed This Encounter  Procedures   CT Chest Wo Contrast   Return in about 6 months (around 12/17/2021) for with APP or Dr. Valeta Harms.    Please do your part to reduce the spread of COVID-19.

## 2021-06-20 NOTE — Progress Notes (Signed)
Patient Care Team: [name], MD as PCP - General (Family Medicine) Gatha Mayer, MD as Consulting Physician (Gastroenterology) Nicholas Lose, MD as Consulting Physician (Hematology and Oncology) Madelin Rear, [facility] as Pharmacist (Pharmacist)  DIAGNOSIS:    ICD-10-CM   1. Thrombocytopenia (HCC)  D69.6       CHIEF COMPLIANT: Follow-up for thrombocytopenia and anemia  INTERVAL HISTORY: [name] [name] is a 71 y.o. with above-mentioned history of thrombocytopenia and anemia. Labs on 12/07/20 RBC 2.72, Hg 8.0, HCT 24.9, platelets 94. She is currently on Xarelto for secondary stroke prevention. She presents to the clinic today for follow-up.  She is complaining of profound fatigue dizziness and lightheadedness.  She is also short of breath to minimal exertion.  She has been feeling this way for the past 2 to 3 months.  She has been to cardiology.  She has been anemic and was previously getting Aranesp injections.  We stopped it when her hemoglobin was about 10 g.  ALLERGIES:  is allergic to penicillins.  MEDICATIONS:  Current Outpatient Medications  Medication Sig Dispense Refill   acetaminophen (TYLENOL) 500 MG tablet Take 1,000 mg by mouth daily as needed for mild pain.     ADULT TPN Inject 1,800 mLs into the vein 4 (four) times a week. Pt receives home TPN from [facility]:  1800 mL bag, four nights weekly (Monday, Tuesday, Wednesday, Thursday for 8 hours (includes 1 hour taper up and down).     baclofen (LIORESAL) 10 MG tablet Take 1 tablet (10 mg total) by mouth 2 (two) times daily as needed for muscle spasms. 60 each 4   Calcium Carb-Cholecalciferol (CALCIUM + D3 PO) Take 2 tablets by mouth daily.     cetirizine (ZYRTEC) 10 MG tablet Take 1 tablet (10 mg total) by mouth at bedtime as needed for allergies. 90 tablet 1   diphenhydrAMINE (BENADRYL) 25 mg capsule Take 25-50 mg by mouth every 6 (six) hours as needed for itching or sleep.      empagliflozin (JARDIANCE) 10 MG TABS  tablet Take 1 tablet (10 mg total) by mouth daily before breakfast. 90 tablet 3   fluticasone (FLONASE) 50 MCG/ACT nasal spray Place 2 sprays into both nostrils daily as needed for allergies or rhinitis.     furosemide (LASIX) 20 MG tablet TAKE ONE TABLET BY MOUTH AS NEEDED FOR SWELLING 45 tablet 1   Heparin Sodium, Porcine, (HEPARIN LOCK FLUSH IJ) Inject 5 mLs as directed 4 (four) times a week. Monday, Tuesday, Wednesday, Thursday in the morning with TPN     losartan (COZAAR) 25 MG tablet Take 0.5 tablets (12.5 mg total) by mouth at bedtime. 90 tablet 3   meclizine (ANTIVERT) 25 MG tablet Take 1 tablet (25 mg total) by mouth 3 (three) times daily as needed for dizziness. 30 tablet 0   Multiple Vitamins-Minerals (ADULT GUMMY PO) Take 2 tablets by mouth daily.     omeprazole (PRILOSEC) 40 MG capsule Take 1 capsule (40 mg total) by mouth daily before breakfast. 90 capsule 1   ondansetron (ZOFRAN-ODT) 8 MG disintegrating tablet TAKE 1 TABLET (8 MG TOTAL) BY MOUTH EVERY 8 (EIGHT) HOURS AS NEEDED FOR NAUSEA OR VOMITING. 30 tablet 11   sodium chloride 0.9 % infusion Inject 900 mLs into the vein every 14 (fourteen) days. Uses for TPN     spironolactone (ALDACTONE) 25 MG tablet Take 0.5 tablets (12.5 mg total) by mouth daily. 30 tablet 2   Teduglutide, rDNA, 5 MG KIT Inject  3.1 application into the skin every morning.     triamcinolone cream (KENALOG) 0.1 % Apply 1 application topically as needed.     vitamin E 1000 UNIT capsule Take 1,000 Units by mouth daily.     XARELTO 20 MG TABS tablet TAKE 1 TABLET (20 MG TOTAL) BY MOUTH DAILY WITH SUPPER. 90 tablet 2   No current facility-administered medications for this visit.    PHYSICAL EXAMINATION: ECOG PERFORMANCE STATUS: 1 - Symptomatic but completely ambulatory  Vitals:   06/21/21 0911  BP: 117/63  Pulse: 100  Resp: 18  Temp: 97.7 F (36.5 C)  SpO2: 98%   Filed Weights   06/21/21 0911  Weight: 154 lb 8 oz (70.1 kg)    LABORATORY DATA:  I  have reviewed the data as listed CMP Latest Ref Rng & Units 06/11/2021 05/24/2021 04/15/2021  Glucose 70 - 99 mg/dL 114(H) 126(H) 99  BUN 6 - 23 mg/dL 27(H) 30(H) 30(H)  Creatinine 0.40 - 1.20 mg/dL 1.24(H) 1.10 1.18  Sodium 135 - 145 mEq/L 136 136 138  Potassium 3.5 - 5.1 mEq/L 3.7 4.6 5.2 No hemolysis seen(H)  Chloride 96 - 112 mEq/L 104 105 106  CO2 19 - 32 mEq/L _0 Calcium 8.4 - 10.5 mg/dL 8.2(L) 8.2(L) 8.6  Total Protein 6.0 - 8.3 g/dL 7.5 7.2 7.4  Total Bilirubin 0.2 - 1.2 mg/dL 0.5 0.4 0.4  Alkaline Phos 39 - 117 U/L 121(H) 120(H) 137(H)  AST 0 - 37 U/L _1 ALT 0 - 35 U/L _2 Lab Results  Component Value Date   WBC 6.1 06/21/2021   HGB 7.8 (L) 06/21/2021   HCT 25.3 (L) 06/21/2021   MCV 93.0 06/21/2021   PLT 92 (L) 06/21/2021   NEUTROABS 4.1 06/21/2021    ASSESSMENT & PLAN:  Thrombocytopenia (HCC) Normochromic normocytic anemia due 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 She received last Aranesp injection February 2016. She had required a total of 3 Aranesp injections in 2015, while she received at least 10 injections in 2014. So we discontinued therapy.   Current treatment:  Reinitiated Aranesp 10/26/2020.  Also received July 2022.  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 This is the reason for her chronic disease.   Patient's baseline platelet count is around 100 09/17/2020: Hemoglobin 9.7, platelets 92 10/10/2020: Hemoglobin 8.5, platelets 52 10/22/2020: Hemoglobin 8.5, platelets 92 11/01/2020: Hemoglobin 9.8, platelets 104 12/19/2020: Hemoglobin 10.7, platelets 98 06/21/2021: Hemoglobin 7.8, platelets 92, MCV 93, iron studies are pending  I suspect that the anemia of chronic kidney disease is getting worse. We would like to resume  Aranesp treatment. Because she is extremely symptomatic I recommended she get a unit of packed red cells as well Also awaiting the results of iron studies.   Labs revealed that patient has acute on chronic renal failure with a creatinine of 1.24 (GFR 44) stage III chronic kidney disease   Hospitalization 10/27/2020-11/01/2020: Sepsis with Klebsiella (left IJ removed 10/27/2020   Return to clinic every 3 weeks for Aranesp injections and every 6 weeks for labs and follow-up with me.      No orders of the defined types were placed in this encounter.  The patient has a good  understanding of the overall plan. she agrees with it. she will call with any problems that may develop before the next visit here.  Total time spent: 30 mins including face to face time and time spent for planning, charting and coordination of care  Rulon Eisenmenger, MD, MPH 06/21/2021  I, Thana Ates, am acting as scribe for Dr. Nicholas Lose.  I have reviewed the above documentation for accuracy and completeness, and I agree with the above.

## 2021-06-20 NOTE — Assessment & Plan Note (Signed)
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 She received last Aranesp injection February 2016. She hadrequired a total of 3 Aranesp injections in 2015, while she received at least 10 injections in 2014. So we discontinued therapy.  Current treatment:ReinitiatedAranesp 10/26/2020.  Also received July 2022.  Holding August 2022 (hemoglobin 10.7)  Short bowel syndromeon Gattex, Teduglutide she is currently on TPN 3 nights a week She had surgery on her small intestine for blood clots in 2006 and since then she has short gut syndrome. This is the reason for her chronic disease.  Patient's baseline platelet count is around 100 09/17/2020: Hemoglobin 9.7, platelets 92 10/10/2020: Hemoglobin 8.5, platelets 52 10/22/2020:Hemoglobin 8.5, platelets 92 11/01/2020: Hemoglobin 9.8, platelets 104 12/19/2020: Hemoglobin 10.7, platelets 98 We will hold off on giving any further Aranesp treatment.  Labs revealed that patient has acute on chronic renal failure with a creatinine of 2.04 and a potassium of 2.3 I will reach out to Dr. Carlean Purl to adjust her TPN.  Hospitalization 10/27/2020-11/01/2020: Sepsis with Klebsiella (left IJ removed 10/27/2020   Return to clinic in 6 months with labs and follow-up.

## 2021-06-21 ENCOUNTER — Inpatient Hospital Stay: Payer: Medicare Other | Attending: Hematology and Oncology

## 2021-06-21 ENCOUNTER — Other Ambulatory Visit: Payer: Self-pay | Admitting: *Deleted

## 2021-06-21 ENCOUNTER — Inpatient Hospital Stay (HOSPITAL_BASED_OUTPATIENT_CLINIC_OR_DEPARTMENT_OTHER): Payer: Medicare Other | Admitting: Hematology and Oncology

## 2021-06-21 ENCOUNTER — Other Ambulatory Visit: Payer: Self-pay

## 2021-06-21 ENCOUNTER — Inpatient Hospital Stay: Payer: Medicare Other

## 2021-06-21 VITALS — BP 117/63 | HR 100 | Temp 97.7°F | Resp 18 | Ht 68.0 in | Wt 154.5 lb

## 2021-06-21 DIAGNOSIS — N183 Chronic kidney disease, stage 3 unspecified: Secondary | ICD-10-CM | POA: Insufficient documentation

## 2021-06-21 DIAGNOSIS — K912 Postsurgical malabsorption, not elsewhere classified: Secondary | ICD-10-CM | POA: Diagnosis not present

## 2021-06-21 DIAGNOSIS — D638 Anemia in other chronic diseases classified elsewhere: Secondary | ICD-10-CM

## 2021-06-21 DIAGNOSIS — D696 Thrombocytopenia, unspecified: Secondary | ICD-10-CM

## 2021-06-21 DIAGNOSIS — D631 Anemia in chronic kidney disease: Secondary | ICD-10-CM | POA: Diagnosis not present

## 2021-06-21 LAB — CBC WITH DIFFERENTIAL (CANCER CENTER ONLY)
Abs Immature Granulocytes: 0.02 10*3/uL (ref 0.00–0.07)
Basophils Absolute: 0 10*3/uL (ref 0.0–0.1)
Basophils Relative: 1 %
Eosinophils Absolute: 0.4 10*3/uL (ref 0.0–0.5)
Eosinophils Relative: 6 %
HCT: 25.3 % — ABNORMAL LOW (ref 36.0–46.0)
Hemoglobin: 7.8 g/dL — ABNORMAL LOW (ref 12.0–15.0)
Immature Granulocytes: 0 %
Lymphocytes Relative: 19 %
Lymphs Abs: 1.2 10*3/uL (ref 0.7–4.0)
MCH: 28.7 pg (ref 26.0–34.0)
MCHC: 30.8 g/dL (ref 30.0–36.0)
MCV: 93 fL (ref 80.0–100.0)
Monocytes Absolute: 0.4 10*3/uL (ref 0.1–1.0)
Monocytes Relative: 6 %
Neutro Abs: 4.1 10*3/uL (ref 1.7–7.7)
Neutrophils Relative %: 68 %
Platelet Count: 92 10*3/uL — ABNORMAL LOW (ref 150–400)
RBC: 2.72 MIL/uL — ABNORMAL LOW (ref 3.87–5.11)
RDW: 15.6 % — ABNORMAL HIGH (ref 11.5–15.5)
WBC Count: 6.1 10*3/uL (ref 4.0–10.5)
nRBC: 0 % (ref 0.0–0.2)

## 2021-06-21 LAB — CMP (CANCER CENTER ONLY)
ALT: 8 U/L (ref 0–44)
AST: 14 U/L — ABNORMAL LOW (ref 15–41)
Albumin: 3.3 g/dL — ABNORMAL LOW (ref 3.5–5.0)
Alkaline Phosphatase: 129 U/L — ABNORMAL HIGH (ref 38–126)
Anion gap: 6 (ref 5–15)
BUN: 27 mg/dL — ABNORMAL HIGH (ref 8–23)
CO2: 23 mmol/L (ref 22–32)
Calcium: 8.4 mg/dL — ABNORMAL LOW (ref 8.9–10.3)
Chloride: 107 mmol/L (ref 98–111)
Creatinine: 1.24 mg/dL — ABNORMAL HIGH (ref 0.44–1.00)
GFR, Estimated: 47 mL/min — ABNORMAL LOW (ref >60)
Glucose, Bld: 92 mg/dL (ref 70–99)
Potassium: 4.2 mmol/L (ref 3.5–5.1)
Sodium: 136 mmol/L (ref 135–145)
Total Bilirubin: 0.4 mg/dL (ref 0.3–1.2)
Total Protein: 7.9 g/dL (ref 6.5–8.1)

## 2021-06-21 LAB — IRON AND IRON BINDING CAPACITY (CC-WL,HP ONLY)
Iron: 35 ug/dL (ref 28–170)
Saturation Ratios: 9 % — ABNORMAL LOW (ref 10.4–31.8)
TIBC: 382 ug/dL (ref 250–450)
UIBC: 347 ug/dL (ref 148–442)

## 2021-06-21 LAB — PREPARE RBC (CROSSMATCH)

## 2021-06-21 LAB — FERRITIN: Ferritin: 66 ng/mL (ref 11–307)

## 2021-06-21 MED ORDER — DARBEPOETIN ALFA 300 MCG/0.6ML IJ SOSY
300.0000 ug | PREFILLED_SYRINGE | Freq: Once | INTRAMUSCULAR | Status: AC
  Administered 2021-06-21: 300 ug via SUBCUTANEOUS
  Filled 2021-06-21: qty 0.6

## 2021-06-21 NOTE — Progress Notes (Signed)
Pt Hgb 7.8.  per MD pt to receive Aranesp injection today and verbal orders received for pt to receive 2 units of PRBC's tomorrow 06/22/21.  Orders placed and pt verbalized understanding.

## 2021-06-21 NOTE — Patient Instructions (Signed)
Darbepoetin Alfa injection ?What is this medication? ?DARBEPOETIN ALFA (dar be POE e tin  AL fa) helps your body make more red blood cells. It is used to treat anemia caused by chronic kidney failure and chemotherapy. ?This medicine may be used for other purposes; ask your health care provider or pharmacist if you have questions. ?COMMON BRAND NAME(S): Aranesp ?What should I tell my care team before I take this medication? ?They need to know if you have any of these conditions: ?blood clotting disorders or history of blood clots ?cancer patient not on chemotherapy ?cystic fibrosis ?heart disease, such as angina, heart failure, or a history of a heart attack ?hemoglobin level of 12 g/dL or greater ?high blood pressure ?low levels of folate, iron, or vitamin B12 ?seizures ?an unusual or allergic reaction to darbepoetin, erythropoietin, albumin, hamster proteins, latex, other medicines, foods, dyes, or preservatives ?pregnant or trying to get pregnant ?breast-feeding ?How should I use this medication? ?This medicine is for injection into a vein or under the skin. It is usually given by a health care professional in a hospital or clinic setting. ?If you get this medicine at home, you will be taught how to prepare and give this medicine. Use exactly as directed. Take your medicine at regular intervals. Do not take your medicine more often than directed. ?It is important that you put your used needles and syringes in a special sharps container. Do not put them in a trash can. If you do not have a sharps container, call your pharmacist or healthcare provider to get one. ?A special MedGuide will be given to you by the pharmacist with each prescription and refill. Be sure to read this information carefully each time. ?Talk to your pediatrician regarding the use of this medicine in children. While this medicine may be used in children as young as 1 month of age for selected conditions, precautions do apply. ?Overdosage: If  you think you have taken too much of this medicine contact a poison control center or emergency room at once. ?NOTE: This medicine is only for you. Do not share this medicine with others. ?What if I miss a dose? ?If you miss a dose, take it as soon as you can. If it is almost time for your next dose, take only that dose. Do not take double or extra doses. ?What may interact with this medication? ?Do not take this medicine with any of the following medications: ?epoetin alfa ?This list may not describe all possible interactions. Give your health care provider a list of all the medicines, herbs, non-prescription drugs, or dietary supplements you use. Also tell them if you smoke, drink alcohol, or use illegal drugs. Some items may interact with your medicine. ?What should I watch for while using this medication? ?Your condition will be monitored carefully while you are receiving this medicine. ?You may need blood work done while you are taking this medicine. ?This medicine may cause a decrease in vitamin B6. You should make sure that you get enough vitamin B6 while you are taking this medicine. Discuss the foods you eat and the vitamins you take with your health care professional. ?What side effects may I notice from receiving this medication? ?Side effects that you should report to your doctor or health care professional as soon as possible: ?allergic reactions like skin rash, itching or hives, swelling of the face, lips, or tongue ?breathing problems ?changes in vision ?chest pain ?confusion, trouble speaking or understanding ?feeling faint or lightheaded, falls ?high blood   pressure ?muscle aches or pains ?pain, swelling, warmth in the leg ?rapid weight gain ?severe headaches ?sudden numbness or weakness of the face, arm or leg ?trouble walking, dizziness, loss of balance or coordination ?seizures (convulsions) ?swelling of the ankles, feet, hands ?unusually weak or tired ?Side effects that usually do not require  medical attention (report to your doctor or health care professional if they continue or are bothersome): ?diarrhea ?fever, chills (flu-like symptoms) ?headaches ?nausea, vomiting ?redness, stinging, or swelling at site where injected ?This list may not describe all possible side effects. Call your doctor for medical advice about side effects. You may report side effects to FDA at 1-800-FDA-1088. ?Where should I keep my medication? ?Keep out of the reach of children. ?Store in a refrigerator between 2 and 8 degrees C (36 and 46 degrees F). Do not freeze. Do not shake. Throw away any unused portion if using a single-dose vial. Throw away any unused medicine after the expiration date. ?NOTE: This sheet is a summary. It may not cover all possible information. If you have questions about this medicine, talk to your doctor, pharmacist, or health care provider. ?? 2022 Elsevier/Gold Standard (2017-05-18 00:00:00) ? ?

## 2021-06-22 ENCOUNTER — Inpatient Hospital Stay: Payer: Medicare Other

## 2021-06-22 ENCOUNTER — Other Ambulatory Visit: Payer: Self-pay

## 2021-06-22 VITALS — BP 115/66 | HR 61 | Temp 97.9°F | Resp 16

## 2021-06-22 DIAGNOSIS — N183 Chronic kidney disease, stage 3 unspecified: Secondary | ICD-10-CM | POA: Diagnosis not present

## 2021-06-22 DIAGNOSIS — D638 Anemia in other chronic diseases classified elsewhere: Secondary | ICD-10-CM

## 2021-06-22 DIAGNOSIS — K912 Postsurgical malabsorption, not elsewhere classified: Secondary | ICD-10-CM | POA: Diagnosis not present

## 2021-06-22 DIAGNOSIS — D631 Anemia in chronic kidney disease: Secondary | ICD-10-CM | POA: Diagnosis not present

## 2021-06-22 DIAGNOSIS — D696 Thrombocytopenia, unspecified: Secondary | ICD-10-CM | POA: Diagnosis not present

## 2021-06-22 MED ORDER — SODIUM CHLORIDE 0.9% FLUSH: 10.0000 mL | INTRAVENOUS | Status: DC | PRN

## 2021-06-22 MED ORDER — ACETAMINOPHEN 325 MG PO TABS
650.0000 mg | ORAL_TABLET | Freq: Once | ORAL | Status: AC
  Administered 2021-06-22: 650 mg via ORAL
  Filled 2021-06-22: qty 2

## 2021-06-22 MED ORDER — DIPHENHYDRAMINE HCL 25 MG PO CAPS
25.0000 mg | ORAL_CAPSULE | Freq: Once | ORAL | Status: AC
  Administered 2021-06-22: 25 mg via ORAL
  Filled 2021-06-22: qty 1

## 2021-06-24 ENCOUNTER — Other Ambulatory Visit (HOSPITAL_COMMUNITY): Payer: Self-pay | Admitting: Internal Medicine

## 2021-06-24 ENCOUNTER — Telehealth: Payer: Self-pay | Admitting: Hematology and Oncology

## 2021-06-24 LAB — BPAM RBC
Blood Product Expiration Date: 202302232359
Blood Product Expiration Date: 202302232359
ISSUE DATE / TIME: 202302110858
ISSUE DATE / TIME: 202302110858
Unit Type and Rh: 7300
Unit Type and Rh: 7300

## 2021-06-24 LAB — TYPE AND SCREEN
ABO/RH(D): B POS
Antibody Screen: NEGATIVE
Unit division: 0
Unit division: 0

## 2021-06-24 NOTE — Telephone Encounter (Signed)
Scheduled appointment per 2/10 los. Patient is aware of upcoming appointments.

## 2021-06-26 LAB — ERYTHROPOIETIN: Erythropoietin: 71 m[IU]/mL — ABNORMAL HIGH (ref 2.6–18.5)

## 2021-06-28 ENCOUNTER — Other Ambulatory Visit: Payer: Self-pay | Admitting: Internal Medicine

## 2021-06-28 ENCOUNTER — Telehealth: Payer: Self-pay

## 2021-06-28 NOTE — Telephone Encounter (Signed)
[employer] copy)  from [facility] called office and requested to speak to Dr. Carlean Purl Nurse. Verdis Frederickson stated that she spoke to Pt husband this AM and husband stated that pt was sick, shaking and dying in bed. Mariah encouraged pt to contact 911 in which pt denied advise. Follow up phone call was made my Verdis Frederickson and Husband stated that pt did take some fever reducing medication and walked from the bed to the [place].  Phone call was placed to follow up on Andelyn and how she was feeling. Spoke to pt Husband Herbie Baltimore which stated that pt was very cool to the touch; and that she was able to walk from the bed to the [place]. Herbie Baltimore was notified of Verdis Frederickson concerns and our concerns at the GI office that pt may have an infection in her [place] and that she needs to seek treatment by calling 911: Spoke with Larrisha to assess how she was feeling. Pt stated that she does not feel good. Pt was informed of our concerns of the possibility of an infection and highly encouraged to call 911 for evaluation and transport to the local ED for assessment and treatment. Nicholl and Husband Herbie Baltimore both aggred to call 911 for evaluation and treatment.

## 2021-06-29 ENCOUNTER — Emergency Department (HOSPITAL_BASED_OUTPATIENT_CLINIC_OR_DEPARTMENT_OTHER): Payer: Medicare Other

## 2021-06-29 ENCOUNTER — Other Ambulatory Visit: Payer: Self-pay

## 2021-06-29 ENCOUNTER — Encounter (HOSPITAL_BASED_OUTPATIENT_CLINIC_OR_DEPARTMENT_OTHER): Payer: Self-pay

## 2021-06-29 ENCOUNTER — Inpatient Hospital Stay (HOSPITAL_COMMUNITY): Payer: Medicare Other

## 2021-06-29 ENCOUNTER — Inpatient Hospital Stay (HOSPITAL_BASED_OUTPATIENT_CLINIC_OR_DEPARTMENT_OTHER)
Admission: EM | Admit: 2021-06-29 | Discharge: 2021-07-03 | DRG: 872 | Disposition: A | Discharge status: Home Health | Payer: Medicare Other | Attending: Internal Medicine | Admitting: Internal Medicine

## 2021-06-29 DIAGNOSIS — Z79899 Other long term (current) drug therapy: Secondary | ICD-10-CM

## 2021-06-29 DIAGNOSIS — Z86010 Personal history of colonic polyps: Secondary | ICD-10-CM

## 2021-06-29 DIAGNOSIS — I5022 Chronic systolic (congestive) heart failure: Secondary | ICD-10-CM | POA: Diagnosis present

## 2021-06-29 DIAGNOSIS — A419 Sepsis, unspecified organism: Secondary | ICD-10-CM | POA: Diagnosis not present

## 2021-06-29 DIAGNOSIS — E871 Hypo-osmolality and hyponatremia: Secondary | ICD-10-CM | POA: Diagnosis present

## 2021-06-29 DIAGNOSIS — R590 Localized enlarged lymph nodes: Secondary | ICD-10-CM | POA: Diagnosis not present

## 2021-06-29 DIAGNOSIS — Z9049 Acquired absence of other specified parts of digestive tract: Secondary | ICD-10-CM

## 2021-06-29 DIAGNOSIS — S42214A Unspecified nondisplaced fracture of surgical neck of right humerus, initial encounter for closed fracture: Secondary | ICD-10-CM | POA: Diagnosis present

## 2021-06-29 DIAGNOSIS — I4891 Unspecified atrial fibrillation: Secondary | ICD-10-CM | POA: Diagnosis present

## 2021-06-29 DIAGNOSIS — N61 Mastitis without abscess: Secondary | ICD-10-CM | POA: Diagnosis not present

## 2021-06-29 DIAGNOSIS — K912 Postsurgical malabsorption, not elsewhere classified: Secondary | ICD-10-CM | POA: Diagnosis present

## 2021-06-29 DIAGNOSIS — N179 Acute kidney failure, unspecified: Secondary | ICD-10-CM | POA: Diagnosis present

## 2021-06-29 DIAGNOSIS — E559 Vitamin D deficiency, unspecified: Secondary | ICD-10-CM | POA: Diagnosis present

## 2021-06-29 DIAGNOSIS — M810 Age-related osteoporosis without current pathological fracture: Secondary | ICD-10-CM | POA: Diagnosis not present

## 2021-06-29 DIAGNOSIS — Z8673 Personal history of transient ischemic attack (TIA), and cerebral infarction without residual deficits: Secondary | ICD-10-CM

## 2021-06-29 DIAGNOSIS — L539 Erythematous condition, unspecified: Secondary | ICD-10-CM | POA: Diagnosis present

## 2021-06-29 DIAGNOSIS — T82514A Breakdown (mechanical) of infusion catheter, initial encounter: Secondary | ICD-10-CM | POA: Diagnosis present

## 2021-06-29 DIAGNOSIS — W19XXXA Unspecified fall, initial encounter: Secondary | ICD-10-CM | POA: Diagnosis present

## 2021-06-29 DIAGNOSIS — A409 Streptococcal sepsis, unspecified: Secondary | ICD-10-CM | POA: Diagnosis present

## 2021-06-29 DIAGNOSIS — Z8616 Personal history of COVID-19: Secondary | ICD-10-CM

## 2021-06-29 DIAGNOSIS — K219 Gastro-esophageal reflux disease without esophagitis: Secondary | ICD-10-CM | POA: Diagnosis present

## 2021-06-29 DIAGNOSIS — R6520 Severe sepsis without septic shock: Secondary | ICD-10-CM | POA: Diagnosis not present

## 2021-06-29 DIAGNOSIS — Z880 Allergy status to penicillin: Secondary | ICD-10-CM

## 2021-06-29 DIAGNOSIS — Z20822 Contact with and (suspected) exposure to covid-19: Secondary | ICD-10-CM | POA: Diagnosis present

## 2021-06-29 DIAGNOSIS — D638 Anemia in other chronic diseases classified elsewhere: Secondary | ICD-10-CM | POA: Diagnosis present

## 2021-06-29 DIAGNOSIS — M7989 Other specified soft tissue disorders: Secondary | ICD-10-CM | POA: Diagnosis not present

## 2021-06-29 DIAGNOSIS — Y712 Prosthetic and other implants, materials and accessory cardiovascular devices associated with adverse incidents: Secondary | ICD-10-CM | POA: Diagnosis present

## 2021-06-29 DIAGNOSIS — T82598A Other mechanical complication of other cardiac and vascular devices and implants, initial encounter: Secondary | ICD-10-CM | POA: Diagnosis not present

## 2021-06-29 DIAGNOSIS — B9623 Unspecified Shiga toxin-producing Escherichia coli [E. coli] (STEC) as the cause of diseases classified elsewhere: Secondary | ICD-10-CM | POA: Diagnosis present

## 2021-06-29 DIAGNOSIS — N3001 Acute cystitis with hematuria: Secondary | ICD-10-CM | POA: Diagnosis not present

## 2021-06-29 DIAGNOSIS — Z7984 Long term (current) use of oral hypoglycemic drugs: Secondary | ICD-10-CM

## 2021-06-29 DIAGNOSIS — R531 Weakness: Secondary | ICD-10-CM | POA: Diagnosis not present

## 2021-06-29 DIAGNOSIS — A491 Streptococcal infection, unspecified site: Secondary | ICD-10-CM

## 2021-06-29 DIAGNOSIS — D6859 Other primary thrombophilia: Secondary | ICD-10-CM | POA: Diagnosis present

## 2021-06-29 DIAGNOSIS — J9811 Atelectasis: Secondary | ICD-10-CM | POA: Diagnosis not present

## 2021-06-29 DIAGNOSIS — M79602 Pain in left arm: Secondary | ICD-10-CM | POA: Diagnosis not present

## 2021-06-29 DIAGNOSIS — L03114 Cellulitis of left upper limb: Secondary | ICD-10-CM | POA: Diagnosis not present

## 2021-06-29 DIAGNOSIS — S42211A Unspecified displaced fracture of surgical neck of right humerus, initial encounter for closed fracture: Secondary | ICD-10-CM | POA: Diagnosis not present

## 2021-06-29 DIAGNOSIS — A498 Other bacterial infections of unspecified site: Secondary | ICD-10-CM

## 2021-06-29 DIAGNOSIS — M25511 Pain in right shoulder: Secondary | ICD-10-CM | POA: Diagnosis not present

## 2021-06-29 DIAGNOSIS — R21 Rash and other nonspecific skin eruption: Secondary | ICD-10-CM | POA: Diagnosis present

## 2021-06-29 DIAGNOSIS — N1831 Chronic kidney disease, stage 3a: Secondary | ICD-10-CM | POA: Diagnosis not present

## 2021-06-29 DIAGNOSIS — S4291XA Fracture of right shoulder girdle, part unspecified, initial encounter for closed fracture: Secondary | ICD-10-CM | POA: Diagnosis not present

## 2021-06-29 DIAGNOSIS — Z9889 Other specified postprocedural states: Secondary | ICD-10-CM | POA: Diagnosis not present

## 2021-06-29 DIAGNOSIS — R233 Spontaneous ecchymoses: Secondary | ICD-10-CM | POA: Diagnosis present

## 2021-06-29 DIAGNOSIS — K529 Noninfective gastroenteritis and colitis, unspecified: Secondary | ICD-10-CM | POA: Diagnosis present

## 2021-06-29 DIAGNOSIS — S42294K Other nondisplaced fracture of upper end of right humerus, subsequent encounter for fracture with nonunion: Secondary | ICD-10-CM | POA: Diagnosis not present

## 2021-06-29 DIAGNOSIS — R7881 Bacteremia: Secondary | ICD-10-CM | POA: Diagnosis not present

## 2021-06-29 DIAGNOSIS — B950 Streptococcus, group A, as the cause of diseases classified elsewhere: Secondary | ICD-10-CM | POA: Diagnosis not present

## 2021-06-29 DIAGNOSIS — Z7901 Long term (current) use of anticoagulants: Secondary | ICD-10-CM

## 2021-06-29 DIAGNOSIS — S42309A Unspecified fracture of shaft of humerus, unspecified arm, initial encounter for closed fracture: Secondary | ICD-10-CM

## 2021-06-29 DIAGNOSIS — E876 Hypokalemia: Secondary | ICD-10-CM | POA: Diagnosis not present

## 2021-06-29 DIAGNOSIS — R609 Edema, unspecified: Secondary | ICD-10-CM | POA: Diagnosis not present

## 2021-06-29 DIAGNOSIS — L538 Other specified erythematous conditions: Secondary | ICD-10-CM | POA: Diagnosis not present

## 2021-06-29 LAB — CBC WITH DIFFERENTIAL/PLATELET
Abs Immature Granulocytes: 0.17 10*3/uL — ABNORMAL HIGH (ref 0.00–0.07)
Basophils Absolute: 0 10*3/uL (ref 0.0–0.1)
Basophils Relative: 0 %
Eosinophils Absolute: 0 10*3/uL (ref 0.0–0.5)
Eosinophils Relative: 0 %
HCT: 31.8 % — ABNORMAL LOW (ref 36.0–46.0)
Hemoglobin: 10.2 g/dL — ABNORMAL LOW (ref 12.0–15.0)
Immature Granulocytes: 1 %
Lymphocytes Relative: 6 %
Lymphs Abs: 0.9 10*3/uL (ref 0.7–4.0)
MCH: 28.7 pg (ref 26.0–34.0)
MCHC: 32.1 g/dL (ref 30.0–36.0)
MCV: 89.3 fL (ref 80.0–100.0)
Monocytes Absolute: 0.8 10*3/uL (ref 0.1–1.0)
Monocytes Relative: 5 %
Neutro Abs: 14.4 10*3/uL — ABNORMAL HIGH (ref 1.7–7.7)
Neutrophils Relative %: 88 %
Platelets: 64 10*3/uL — ABNORMAL LOW (ref 150–400)
RBC: 3.56 MIL/uL — ABNORMAL LOW (ref 3.87–5.11)
RDW: 16 % — ABNORMAL HIGH (ref 11.5–15.5)
WBC: 16.3 10*3/uL — ABNORMAL HIGH (ref 4.0–10.5)
nRBC: 0 % (ref 0.0–0.2)

## 2021-06-29 LAB — COMPREHENSIVE METABOLIC PANEL
ALT: 23 U/L (ref 0–44)
AST: 42 U/L — ABNORMAL HIGH (ref 15–41)
Albumin: 2.8 g/dL — ABNORMAL LOW (ref 3.5–5.0)
Alkaline Phosphatase: 145 U/L — ABNORMAL HIGH (ref 38–126)
Anion gap: 10 (ref 5–15)
BUN: 27 mg/dL — ABNORMAL HIGH (ref 8–23)
CO2: 19 mmol/L — ABNORMAL LOW (ref 22–32)
Calcium: 7.7 mg/dL — ABNORMAL LOW (ref 8.9–10.3)
Chloride: 98 mmol/L (ref 98–111)
Creatinine, Ser: 1.83 mg/dL — ABNORMAL HIGH (ref 0.44–1.00)
GFR, Estimated: 29 mL/min — ABNORMAL LOW (ref >60)
Glucose, Bld: 101 mg/dL — ABNORMAL HIGH (ref 70–99)
Potassium: 3.9 mmol/L (ref 3.5–5.1)
Sodium: 127 mmol/L — ABNORMAL LOW (ref 135–145)
Total Bilirubin: 1.1 mg/dL (ref 0.3–1.2)
Total Protein: 8 g/dL (ref 6.5–8.1)

## 2021-06-29 LAB — URINALYSIS, ROUTINE W REFLEX MICROSCOPIC
Bilirubin Urine: NEGATIVE
Glucose, UA: NEGATIVE mg/dL
Ketones, ur: NEGATIVE mg/dL
Nitrite: NEGATIVE
Protein, ur: NEGATIVE mg/dL
Specific Gravity, Urine: <=1.005 (ref 1.005–1.030)
pH: 5 (ref 5.0–8.0)

## 2021-06-29 LAB — URINALYSIS, MICROSCOPIC (REFLEX)

## 2021-06-29 LAB — LACTIC ACID, PLASMA
Lactic Acid, Venous: 2.4 mmol/L (ref 0.5–1.9)
Lactic Acid, Venous: 1.6 mmol/L (ref 0.5–1.9)

## 2021-06-29 LAB — MRSA NEXT GEN BY PCR, NASAL: MRSA by PCR Next Gen: NOT DETECTED

## 2021-06-29 LAB — CBG MONITORING, ED: Glucose-Capillary: 97 mg/dL (ref 70–99)

## 2021-06-29 LAB — PROTIME-INR
INR: 4.6 (ref 0.8–1.2)
Prothrombin Time: 43.2 seconds — ABNORMAL HIGH (ref 11.4–15.2)

## 2021-06-29 LAB — RESP PANEL BY RT-PCR (FLU A&B, COVID) ARPGX2
Influenza A by PCR: NEGATIVE
Influenza B by PCR: NEGATIVE
SARS Coronavirus 2 by RT PCR: NEGATIVE

## 2021-06-29 MED ORDER — LACTATED RINGERS IV BOLUS (SEPSIS)
1000.0000 mL | Freq: Once | INTRAVENOUS | Status: AC
  Administered 2021-06-29: 1000 mL via INTRAVENOUS

## 2021-06-29 MED ORDER — VANCOMYCIN HCL 1500 MG/300ML IV SOLN
1500.0000 mg | INTRAVENOUS | Status: DC
  Filled 2021-06-29: qty 300

## 2021-06-29 MED ORDER — HEPARIN SOD (PORK) LOCK FLUSH 100 UNIT/ML IV SOLN
250.0000 [IU] | INTRAVENOUS | Status: DC | PRN
  Filled 2021-06-29: qty 2.5

## 2021-06-29 MED ORDER — SODIUM CHLORIDE 0.9 % IV SOLN: INTRAVENOUS | Status: DC | PRN

## 2021-06-29 MED ORDER — ONDANSETRON HCL 4 MG PO TABS: 4.0000 mg | ORAL_TABLET | Freq: Four times a day (QID) | ORAL | Status: DC | PRN

## 2021-06-29 MED ORDER — LACTATED RINGERS IV BOLUS: 1000.0000 mL | Freq: Once | INTRAVENOUS | Status: DC

## 2021-06-29 MED ORDER — SODIUM CHLORIDE 0.9 % IV SOLN
2.0000 g | INTRAVENOUS | Status: DC
  Administered 2021-06-29: 2 g via INTRAVENOUS
  Filled 2021-06-29 (×2): qty 20

## 2021-06-29 MED ORDER — ACETAMINOPHEN 325 MG PO TABS
650.0000 mg | ORAL_TABLET | Freq: Once | ORAL | Status: DC
  Filled 2021-06-29 (×2): qty 2

## 2021-06-29 MED ORDER — SODIUM CHLORIDE 0.9% FLUSH: 3.0000 mL | INTRAVENOUS | Status: DC | PRN

## 2021-06-29 MED ORDER — SODIUM CHLORIDE 0.9% IV SOLUTION: 250.0000 mL | Freq: Once | INTRAVENOUS | Status: DC

## 2021-06-29 MED ORDER — LACTATED RINGERS IV SOLN: INTRAVENOUS | Status: DC

## 2021-06-29 MED ORDER — CHLORHEXIDINE GLUCONATE CLOTH 2 % EX PADS
6.0000 | MEDICATED_PAD | Freq: Every day | CUTANEOUS | Status: DC
  Administered 2021-06-29 – 2021-07-02 (×4): 6 via TOPICAL

## 2021-06-29 MED ORDER — ACETAMINOPHEN 325 MG PO TABS: 650.0000 mg | ORAL_TABLET | Freq: Once | ORAL | Status: DC

## 2021-06-29 MED ORDER — HEPARIN SOD (PORK) LOCK FLUSH 100 UNIT/ML IV SOLN
500.0000 [IU] | Freq: Every day | INTRAVENOUS | Status: DC | PRN
  Filled 2021-06-29: qty 5

## 2021-06-29 MED ORDER — RIVAROXABAN 20 MG PO TABS
20.0000 mg | ORAL_TABLET | Freq: Every day | ORAL | Status: DC
  Administered 2021-06-29 – 2021-07-02 (×4): 20 mg via ORAL
  Filled 2021-06-29 (×4): qty 1

## 2021-06-29 MED ORDER — LEVOFLOXACIN IN D5W 750 MG/150ML IV SOLN: 750.0000 mg | Freq: Once | INTRAVENOUS | Status: DC

## 2021-06-29 MED ORDER — VANCOMYCIN HCL IN DEXTROSE 1-5 GM/200ML-% IV SOLN
1000.0000 mg | Freq: Once | INTRAVENOUS | Status: AC
  Administered 2021-06-29: 1000 mg via INTRAVENOUS
  Filled 2021-06-29: qty 200

## 2021-06-29 MED ORDER — LACTATED RINGERS IV BOLUS (SEPSIS)
250.0000 mL | Freq: Once | INTRAVENOUS | Status: AC
  Administered 2021-06-29: 250 mL via INTRAVENOUS

## 2021-06-29 MED ORDER — ONDANSETRON HCL 4 MG/2ML IJ SOLN: 4.0000 mg | Freq: Four times a day (QID) | INTRAMUSCULAR | Status: DC | PRN

## 2021-06-29 MED ORDER — ACETAMINOPHEN 325 MG PO TABS
650.0000 mg | ORAL_TABLET | Freq: Four times a day (QID) | ORAL | Status: DC | PRN
  Administered 2021-06-29 – 2021-07-02 (×6): 650 mg via ORAL
  Filled 2021-06-29 (×5): qty 2

## 2021-06-29 MED ORDER — ACETAMINOPHEN 650 MG RE SUPP: 650.0000 mg | Freq: Four times a day (QID) | RECTAL | Status: DC | PRN

## 2021-06-29 MED ORDER — SODIUM CHLORIDE 4 MEQ/ML IV SOLN
INTRAVENOUS | Status: AC
  Filled 2021-06-29 (×4): qty 970.75

## 2021-06-29 MED ORDER — DIPHENHYDRAMINE HCL 25 MG PO CAPS
25.0000 mg | ORAL_CAPSULE | Freq: Once | ORAL | Status: AC
  Administered 2021-06-29: 25 mg via ORAL
  Filled 2021-06-29 (×2): qty 1

## 2021-06-29 NOTE — Progress Notes (Signed)
Orthopedic Tech Progress Note Patient Details:  [name] [name] 1950/09/18 [id number]  Ortho Devices Type of Ortho Device: Sling immobilizer Ortho Device/Splint Location: right Ortho Device/Splint Interventions: Application   Post Interventions Patient Tolerated: Well Instructions Provided: Care of device  [place] Pink 06/29/2021, 8:26 PM

## 2021-06-29 NOTE — Progress Notes (Signed)
Elink following for sepsis protocol. 

## 2021-06-29 NOTE — Assessment & Plan Note (Addendum)
Patient initially received IV fluid per sepsis protocol. Seems euvolemic. -Continue to monitor -Keep holding home Lasix and spironolactone

## 2021-06-29 NOTE — Progress Notes (Signed)
Plan of Care Note for accepted transfer   Patient: [name] [name] MRN: [id number]   DOA: 06/29/2021  Facility requesting transfer: [facility] Requesting Provider: Dr. Karle Starch Reason for transfer: Sepsis Facility course: 71 yo female  w/ history of HFrEF, ACD, short gut syndrome on chronic TPN. Presenting with fatigue and right arm pain. Found to be septic and have a right arm fracture. Source of sepsis is PICC line vs UTI. Ortho consulted.  Plan of care: The patient is accepted for admission to [facility] unit, at [facility]..  While patient is holding at [facility], medical decision will remain with the [facility] medical staff. Upon arrival to [facility], nursing will need to notify patient placement that the patient is ready for [facility] evaluation. Thank you.   Author: Jonnie Finner, DO 06/29/2021  Check [email or web address] for on-call coverage.  Nursing staff, Please call [place] number on Amion as soon as patient's arrival, so appropriate admitting provider can evaluate the pt.

## 2021-06-29 NOTE — Assessment & Plan Note (Addendum)
Patient is on Xarelto at home. -Continue Xarelto

## 2021-06-29 NOTE — ED Notes (Signed)
IV attempt x 2 right AC, unsuccessful.

## 2021-06-29 NOTE — Progress Notes (Signed)
Pharmacy Antibiotic Note  [name] [name] is a 71 y.o. female admitted on 06/29/2021 with cellulitis.  Pharmacy has been consulted for Levaquin and vancomycin dosing. Of note, patient has tolerated numerous cephalosporins in the past. Will change Levaquin to ceftriaxone per protocol.   WBC and LA elevated. CrCl ~ 28 mL/min   Plan: -Ceftriaxone 2 gm IV Q 24 hours -Vancomycin 1 gm IV now followed by Vancomycin 1500 mg IV Q 48 hrs. Goal AUC 400-550. Expected AUC: 539 SCr used: 1.83 -Monitor CBC, renal fx, cultures and clinical progress -Vanc levels as indicated    Height: 5\' 8"  (172.7 cm) Weight: 69.4 kg (153 lb) IBW/kg (Calculated) : 63.9  Temp (24hrs), Avg:100 F (37.8 C), Min:100 F (37.8 C), Max:100 F (37.8 C)  Recent Labs  Lab 06/29/21 0951  WBC 16.3*  CREATININE 1.83*  LATICACIDVEN 2.4*    Estimated Creatinine Clearance: 28.4 mL/min (A) (by C-G formula based on SCr of 1.83 mg/dL (H)).    Allergies  Allergen Reactions   Penicillins Itching, Swelling and Other (See Comments)    Reaction:  Facial swelling Has patient had a PCN reaction causing immediate rash, facial/tongue/throat swelling, SOB or lightheadedness with hypotension: Yes Has patient had a PCN reaction causing severe rash involving mucus membranes or skin necrosis: No Has patient had a PCN reaction that required hospitalization No Has patient had a PCN reaction occurring within the last 10 years: No If all of the above answers are "NO", then may proceed with Cephalosporin use.    Antimicrobials this admission: Ceftriaxone 2/18 >>  Vancomycin 2/18 >>   Dose adjustments this admission:   Microbiology results: 2/18 BCx:    Thank you for allowing pharmacy to be a part of this patients care.  Albertina Parr, PharmD., BCCCP Clinical Pharmacist Please refer to [facility] for unit-specific pharmacist

## 2021-06-29 NOTE — ED Notes (Signed)
Pt incontinent of loose orange-colored stool; pt cleaned and changed.

## 2021-06-29 NOTE — Progress Notes (Signed)
I have discussed her case with the ER provider at [facility].  Patient currently septic, with what sounds like a right proximal humerus fracture although the story is not clear about the etiology.  I have ordered a CAT scan to better determine the degree of displacement, so far it looks reasonably well aligned, and certainly nonsurgical management would be the first course of action at least until she is medically stabilized.  Okay for sling management, full consult to follow once she has arrived at either [facility] or [place] [facility].  We will probably see her tomorrow.  Marchia Bond, MD

## 2021-06-29 NOTE — ED Notes (Signed)
Patient transported to CT on cardiac monitor with RN.

## 2021-06-29 NOTE — ED Provider Notes (Signed)
[facility] Provider Note   CSN: [id number] Arrival date & time: 06/29/21  0920     History  Chief Complaint  Patient presents with   Weakness    [name] [name] is a 71 y.o. 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  Patient states that she woke up 1 morning and felt like her right arm significantly hurt without known trauma.  However, the left arm had a rash from forearm to shoulder and on her left breast. Patient's husband thinks that maybe she slept on a heating pad causing her to burn.  Rash has been constant for the last 2 days, neither improving or worsening.  Patient endorses fevers at home.  She denies abdominal pain, chest pain, shortness of breath, vomiting, diarrhea, constipation, urinary symptoms.   Weakness     Home Medications Prior to Admission medications   Medication Sig Start Date End Date Taking? Authorizing Provider  ADULT TPN Inject 1,800 mLs into the vein 4 (four) times a week. Pt receives home TPN from [facility]:  1800 mL bag, four nights weekly (Monday, Tuesday, Wednesday, Thursday for 8 hours (includes 1 hour taper up and down).   Yes [provider]  Calcium Carb-Cholecalciferol (CALCIUM + D3 PO) Take 2 tablets by mouth daily.   Yes [provider]  cetirizine (ZYRTEC) 10 MG tablet Take 1 tablet (10 mg total) by mouth at bedtime as needed for allergies. 12/19/20  Yes Dutch Quint B, FNP  diphenhydrAMINE (BENADRYL) 25 mg capsule Take 25-50 mg by mouth every 6 (six) hours as needed for itching or sleep.    Yes [provider]  empagliflozin (JARDIANCE) 10 MG TABS tablet Take 1 tablet (10 mg total) by mouth daily before breakfast. 06/11/21  Yes Bensimhon, Shaune Pascal, MD  fluticasone (FLONASE) 50 MCG/ACT nasal spray Place 2 sprays into both nostrils daily as needed  for allergies or rhinitis.   Yes [provider]  Heparin Sodium, Porcine, (HEPARIN LOCK FLUSH IJ) Inject 5 mLs as directed 4 (four) times a week. Monday, Tuesday, Wednesday, Thursday in the morning with TPN   Yes [provider]  meclizine (ANTIVERT) 25 MG tablet Take 1 tablet (25 mg total) by mouth 3 (three) times daily as needed for dizziness. 04/29/21  Yes Haydee Salter, MD  Multiple Vitamins-Minerals (ADULT GUMMY PO) Take 2 tablets by mouth daily.   Yes [provider]  omeprazole (PRILOSEC) 40 MG capsule TAKE 1 CAPSULE BY MOUTH EVERY DAY BEFORE BREAKFAST 06/28/21  Yes Gatha Mayer, MD  ondansetron (ZOFRAN-ODT) 8 MG disintegrating tablet TAKE 1 TABLET (8 MG TOTAL) BY MOUTH EVERY 8 (EIGHT) HOURS AS NEEDED FOR NAUSEA OR VOMITING. 11/21/20  Yes Gatha Mayer, MD  spironolactone (ALDACTONE) 25 MG tablet Take 0.5 tablets (12.5 mg total) by mouth daily. 12/18/20  Yes Bensimhon, Shaune Pascal, MD  acetaminophen (TYLENOL) 500 MG tablet Take 1,000 mg by mouth daily as needed for mild pain.    [provider]  baclofen (LIORESAL) 10 MG tablet Take 1 tablet (10 mg total) by mouth 2 (two) times daily as needed for muscle spasms. 08/29/20   Frann Rider, NP  furosemide (LASIX) 20 MG tablet TAKE ONE TABLET BY MOUTH AS NEEDED FOR SWELLING 02/11/21   Bensimhon, Shaune Pascal, MD  losartan (COZAAR) 25 MG tablet Take 0.5 tablets (12.5 mg total)  by mouth at bedtime. 12/18/20 06/11/22  Bensimhon, Shaune Pascal, MD  sodium chloride 0.9 % infusion Inject 900 mLs into the vein every 14 (fourteen) days. Uses for TPN 10/27/17   [provider]  Teduglutide, rDNA, 5 MG KIT Inject 3.1 application into the skin every morning.    [provider]  triamcinolone cream (KENALOG) 0.1 % Apply 1 application topically as needed.    [provider]  vitamin E 1000 UNIT capsule Take 1,000 Units by mouth daily.    [provider]  XARELTO 20 MG TABS tablet TAKE 1 TABLET (20 MG  TOTAL) BY MOUTH DAILY WITH SUPPER. 11/13/20   [name], MD      Allergies    Penicillins    Review of Systems   Review of Systems  Neurological:  Positive for weakness.   Physical Exam Updated Vital Signs BP (!) 88/55    Pulse 95    Temp 100 F (37.8 C) (Oral)    Resp (!) 24    Ht _0  (1.727 m)    Wt 69.4 kg    LMP 02/26/2014    SpO2 96%    BMI 23.26 kg/m  Vitals:   06/29/21 1724 06/29/21 1730  BP:    Pulse: 72   Resp: 15   Temp:  98.2 F (36.8 C)  SpO2: 99%     Physical Exam Vitals and nursing note reviewed.  Constitutional:      General: She is not in acute distress.    Appearance: She is not ill-appearing.  HENT:     Head: Atraumatic.     Mouth/Throat:     Mouth: Mucous membranes are dry.     Comments: Oral mucosa dry, lips cracked and bleeding Eyes:     Conjunctiva/sclera: Conjunctivae normal.  Cardiovascular:     Rate and Rhythm: Normal rate and regular rhythm.     Pulses: Normal pulses.          Radial pulses are 2+ on the right side and 2+ on the left side.       Dorsalis pedis pulses are 2+ on the right side and 2+ on the left side.     Heart sounds: No murmur heard. Pulmonary:     Effort: Pulmonary effort is normal. Tachypnea present. No respiratory distress.     Breath sounds: Normal breath sounds.     Comments: Lung clear to ausculation bilaterally. No tachypnea, no accessory muscle use, no acute distress, no increased work of breathing, no decrease in air movement  Abdominal:     General: Abdomen is flat. There is no distension.     Palpations: Abdomen is soft.     Tenderness: There is no abdominal tenderness.  Musculoskeletal:        General: Normal range of motion.     Cervical back: Normal range of motion.     Comments: Right shoulder with ROM limited due to pain.  No palpable deformity, bruising or crepitus.  2+ radial pulses.  Skin:    General: Skin is warm and dry.     Capillary Refill: Capillary refill takes less than 2 seconds.      Comments: Left arm with a diffuse macular rash.  Areas of nonblanching, dark red petechia.  Area is hot to touch. Left breast with diffuse blanching erythematous rash.  No papules, urticaria, fluctuance or induration.  Functioning PICC line below the left clavicle without signs of infection  Neurological:     General: No  focal deficit present.     Mental Status: She is alert.  Psychiatric:        Mood and Affect: Mood normal.    ED Results / Procedures / Treatments   Labs (all labs ordered are listed, but only abnormal results are displayed) Labs Reviewed  COMPREHENSIVE METABOLIC PANEL - Abnormal; Notable for the following components:      Result Value   Sodium 127 (*)    CO2 19 (*)    Glucose, Bld 101 (*)    BUN 27 (*)    Creatinine, Ser 1.83 (*)    Calcium 7.7 (*)    Albumin 2.8 (*)    AST 42 (*)    Alkaline Phosphatase 145 (*)    GFR, Estimated 29 (*)    All other components within normal limits  LACTIC ACID, PLASMA - Abnormal; Notable for the following components:   Lactic Acid, Venous 2.4 (*)    All other components within normal limits  CBC WITH DIFFERENTIAL/PLATELET - Abnormal; Notable for the following components:   WBC 16.3 (*)    RBC 3.56 (*)    Hemoglobin 10.2 (*)    HCT 31.8 (*)    RDW 16.0 (*)    Platelets 64 (*)    Neutro Abs 14.4 (*)    Abs Immature Granulocytes 0.17 (*)    All other components within normal limits  PROTIME-INR - Abnormal; Notable for the following components:   Prothrombin Time 43.2 (*)    INR 4.6 (*)    All other components within normal limits  URINALYSIS, ROUTINE W REFLEX MICROSCOPIC - Abnormal; Notable for the following components:   Hgb urine dipstick MODERATE (*)    Leukocytes,Ua MODERATE (*)    All other components within normal limits  URINALYSIS, MICROSCOPIC (REFLEX) - Abnormal; Notable for the following components:   Bacteria, UA FEW (*)    All other components within normal limits  RESP PANEL BY RT-PCR (FLU A&B,  COVID) ARPGX2  CULTURE, BLOOD (ROUTINE X 2)  CULTURE, BLOOD (ROUTINE X 2)  LACTIC ACID, PLASMA  CBG MONITORING, ED    EKG EKG Interpretation  Date/Time:  Saturday June 29 2021 09:37:59 EST Ventricular Rate:  90 PR Interval:    QRS Duration: 163 QT Interval:  402 QTC Calculation: 492 R Axis:   85 Text Interpretation: Atrial fibrillation IVCD, consider atypical LBBB Since last tracing Atrial fibrillation has replaced Sinus rhythm lbbb is old Confirmed by Calvert Cantor [phone]) on 06/29/2021 9:50:41 AM  Radiology DG Shoulder Right  Result Date: 06/29/2021 CLINICAL DATA:  Right shoulder pain. EXAM: RIGHT SHOULDER - 2+ VIEW COMPARISON:  None. FINDINGS: Limited two views study shows deformity of the right humeral neck compatible with comminuted fracture. Fracture line not well demonstrated due to positioning and demineralization. IMPRESSION: Comminuted right humeral neck fracture, poorly demonstrated due to positioning and demineralization. Electronically Signed   By: Misty Stanley M.D.   On: 06/29/2021 12:25   DG Chest Port 1 View  Result Date: 06/29/2021 CLINICAL DATA:  Feeling bad. EXAM: PORTABLE CHEST 1 VIEW COMPARISON:  06/11/2021 FINDINGS: Or 959 hours. The cardio pericardial silhouette is enlarged. Interstitial markings are diffusely coarsened with chronic features. Minimal atelectasis noted both lung bases. Possible tiny effusions. Left-sided central line remains in place. Bones are diffusely demineralized. Telemetry leads overlie the chest. IMPRESSION: Low volume film with bibasilar atelectasis. Possible tiny bilateral pleural effusions. Electronically Signed   By: Misty Stanley M.D.   On: 06/29/2021 10:05    Procedures .  Critical Care Performed by: Tonye Pearson, PA-C Authorized by: Tonye Pearson, PA-C   Critical care provider statement:    Critical care time (minutes):  45   Critical care start time:  06/29/2021 10:00 AM   Critical care end time:  06/29/2021 10:45  AM   Critical care was necessary to treat or prevent imminent or life-threatening deterioration of the following conditions:  Sepsis, shock and renal failure   Critical care was time spent personally by me on the following activities:  Development of treatment plan with patient or surrogate, discussions with consultants, evaluation of patient's response to treatment, examination of patient, ordering and review of laboratory studies, ordering and review of radiographic studies, ordering and performing treatments and interventions, pulse oximetry, re-evaluation of patient's condition and review of old charts   I assumed direction of critical care for this patient from another provider in my specialty: no     Care discussed with: admitting provider      Medications Ordered in ED Medications  lactated ringers infusion ( Intravenous Transfusing/Transfer 06/29/21 1728)  cefTRIAXone (ROCEPHIN) 2 g in sodium chloride 0.9 % 100 mL IVPB (0 g Intravenous Stopped 06/29/21 1304)  vancomycin (VANCOREADY) IVPB 1500 mg/300 mL (has no administration in time range)  0.9 %  sodium chloride infusion ( Intravenous Stopped 06/29/21 1301)  0.9 %  sodium chloride infusion ( Intravenous Stopped 06/29/21 1326)  lactated ringers bolus 1,000 mL (0 mLs Intravenous Stopped 06/29/21 1156)    And  lactated ringers bolus 1,000 mL (0 mLs Intravenous Stopped 06/29/21 1302)    And  lactated ringers bolus 250 mL (0 mLs Intravenous Stopped 06/29/21 1327)  vancomycin (VANCOCIN) IVPB 1000 mg/200 mL premix (0 mg Intravenous Stopped 06/29/21 1256)    ED Course/ Medical Decision Making/ A&P                           Medical Decision Making Amount and/or Complexity of Data Reviewed Labs: ordered. Radiology: ordered.  Risk Prescription drug management. Decision regarding hospitalization.   History:  Per HPI  Initial impression:  This patient presents to the ED for concern of weakness, this involves an extensive number of  treatment options, and is a complaint that carries with it a high risk of complications and morbidity.   Ddx includes sepsis, pneumonia, viral URI, cellulitis, failure to thrive  ED Course: 71 year old ill-appearing female.  RN concerned during triage given patient's soft pressures of 90 systolic and history of PICC line with TPN and started sepsis work-up.  On evaluation, patient is hot to the touch.  There is a diffuse, macular rash across the left arm and left breast that is hot to touch.  Areas of nonblanching petechia over the rash as well.  Right shoulder with limited ROM due to pain.  Patient is afebrile at the moment with temp of 100 F.  Awaiting labs and chest x-ray. CBC with white count of 16.3, critical lactic acid called of 2.4.  Respiratory panel was negative.  CMP with sodium of 127, worsening AKI with creatinine at 1.83.  Temperature increased to 100.9 F.  Chest x-ray with mild bilateral effusions, although this does not appear to be new development for her.  Given lab results and physical presentation, I have concern for sepsis secondary to cellulitis, possibly from her PICC line.  Patient started on vancomycin and Rocephin.   UA returns with evidence of UTI.  Possible sources of infection include UTI or  cellulitis.  Medications administered will target both of these pathologies.  Critical INR called in at 4.6. Right shoulder x-ray identified comminuted humeral head fracture.  Patient was put in a sling and I consulted with Dr. Mardelle Matte who agrees to consult on patient while she is medically admitted for her sepsis. I talked to Dr. Marylyn Ishihara with the hospitalist team who agrees to admit patient. I Ordered, reviewed, and interpreted labs and EKG.   I independently visualized and interpreted imaging and I agree with the radiologist interpretation.    Cardiac Monitoring:  The patient was maintained on a cardiac monitor.  I personally viewed and interpreted the cardiac monitored which showed an  underlying rhythm of: NSR   Medicines ordered and prescription drug management:  I ordered medication including: Rocephin 2 g IV LR bolus at 30 mL/kg with continuous LR infusion Vancomycin per pharmacy consult Reevaluation of the patient after these medicines showed that the patient improved I have reviewed the patients home medicines and have made adjustments as needed   Critical Interventions:  Sepsis intervention as described above   Consultations Obtained:  Dr. Mardelle Matte with orthopedics and Dr. Marylyn Ishihara with medicine.  Details of conversation and ED course above.   Disposition:  After consideration of the diagnostic results, physical exam, history and the patients response to treatment feel that the patent would benefit from admission.   Sepsis with acute renal failure without septic shock Acute cystitis with hematuria AKI Closed nondisplaced fracture of right humeral neck: Given patient's presentation and lab work, she needs to be admitted for fluids and aggressive IV antibiotic therapy.  Additionally she needs to be consulted with orthopedics regarding her humeral neck fracture.  Dr. Marylyn Ishihara agrees to admit patient with Dr. Mardelle Matte consulting for her fracture.  All questions were asked and answered.  Patient has been amenable to plan.   Final Clinical Impression(s) / ED Diagnoses Final diagnoses:  Sepsis with acute renal failure without septic shock, due to unspecified organism, unspecified acute renal failure type ([place])  Acute cystitis with hematuria  AKI (acute kidney injury) ([place])  Closed nondisplaced fracture of surgical neck of right humerus, unspecified fracture morphology, initial encounter    Rx / DC Orders ED Discharge Orders     None         Tonye Pearson, PA-C 06/29/21 1801    Truddie Hidden, MD 06/30/21 [phone]

## 2021-06-29 NOTE — Assessment & Plan Note (Addendum)
Orthopedic was consulted and they are recommending conservative management as it seems chronic. -Outpatient orthopedic follow-up

## 2021-06-29 NOTE — ED Triage Notes (Signed)
Began filling bad on Thursday.  Has central line for TPN. Was so cold she slept under an electric blanket and a heating pad on her right side. Her right arm is erythematous, hot and tender to touch. Denies N/V  Diarrhea is chronic Has had frequent cough with runny nose.

## 2021-06-29 NOTE — Assessment & Plan Note (Signed)
Stable

## 2021-06-29 NOTE — Assessment & Plan Note (Addendum)
Seems improving, most likely the cause of bacteremia.

## 2021-06-29 NOTE — Assessment & Plan Note (Addendum)
Continue PPI ?

## 2021-06-29 NOTE — ED Notes (Signed)
Shoulder immobilizer in room, due to only one arm access, BP cuff, etc on right arm, will wait until care link is here to apply.

## 2021-06-29 NOTE — Assessment & Plan Note (Addendum)
Admit to stepdown unit.  Patient is received 30 cc/kg of IV fluids.  Systolic blood pressure around 95.  If this continues to be low with maps less than 65, she will need to start peripheral vasopressors.  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  She currently has a tunneled left subclavian but given her cellulitis of her left breast and left arm, this could be infected.  She has a right EJ peripheral IV.  That is her only source of access at this time.  We possibly could put a right femoral central line if needed but this would be a last resort.  I suspect her sepsis is due to her cellulitis of her left breast and left arm.  The source of the cellulitis however could also be from her central catheter.  Cannot rule out abscess in her left upper arm/forearm as it swollen.  Order CT noncontrasted to better evaluate this.  We will also need left upper extremity ultrasound to rule out DVT.  Patient has been on Xarelto.

## 2021-06-29 NOTE — ED Notes (Signed)
Attempted 2nd IV and 2nd blood cultures 2x with no success. First set of blood cultures collected previously by different RN

## 2021-06-29 NOTE — Assessment & Plan Note (Addendum)
Improving. See above note under sepsis

## 2021-06-29 NOTE — Subjective & Objective (Signed)
Chief complaint: Left arm erythema, left breast erythema, confusion. 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  Patient, daughter and husband all provide history.  Patient states that she slept with a heating pad on her left arm about 3 nights ago.  She woke up with some mild erythema to the left elbow.  Over the next 48 hours, the erythema, induration has spread initially from the elbow down the forearm, up the arm into the left breast.  Family states the patient has been increasingly confused.  She had a fall today while she was in the bathroom.  She tried to sit on the toilet and struck her right arm against the back of the toilet tank.  She had pain.  She had difficulty getting up.  Patient brought to the ER for evaluation.  Daughter states the patient had a fever of 102.5 at home.  On arrival to the ER, temperature is 100.9, heart rate 95 blood pressure 88/55.  Sepsis protocol was started.  Patient received 30 cc/kg IV fluids.  Broad-spectrum antibiotics were started.  Patient was noted to have erythema of her left forearm, left upper arm, left breast.  Imaging also showed a right humerus fracture.  Orthopedics was consulted.  Patient was given more IV fluids.  Consideration for mission to ICU was given but patient's blood pressure improved  However prior to departure from [facility], recorded blood pressure of 85/53.  This was not communicated to the admitting team.  Family states the patient is acting much more alert.  Patient states that she is feeling better.

## 2021-06-29 NOTE — H&P (Signed)
History and Physical    [name] [name] MIW:[id number] DOB: 11/16/1950 DOA: 06/29/2021  DOS: the patient was seen and examined on 06/29/2021  PCP: [name], MD   Patient coming from: Home  I have personally briefly reviewed patient's old medical records in [facility]  Chief complaint: Left arm erythema, left breast erythema, confusion. 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  Patient, daughter and husband all provide history.  Patient states that she slept with a heating pad on her left arm about 3 nights ago.  She woke up with some mild erythema to the left elbow.  Over the next 48 hours, the erythema, induration has spread initially from the elbow down the forearm, up the arm into the left breast.  Family states the patient has been increasingly confused.  She had a fall today while she was in the bathroom.  She tried to sit on the toilet and struck her right arm against the back of the toilet tank.  She had pain.  She had difficulty getting up.  Patient brought to the ER for evaluation.  Daughter states the patient had a fever of 102.5 at home.  On arrival to the ER, temperature is 100.9, heart rate 95 blood pressure 88/55.  Sepsis protocol was started.  Patient received 30 cc/kg IV fluids.  Broad-spectrum antibiotics were started.  Patient was noted to have erythema of her left forearm, left upper arm, left breast.  Imaging also showed a right humerus fracture.  Orthopedics was consulted.  Patient was given more IV fluids.  Consideration for mission to ICU was given but patient's blood pressure improved  However  prior to departure from [place], recorded blood pressure of 85/53.  This was not communicated to the admitting team.  Family states the patient is acting much more alert.  Patient states that she is feeling better.   ED Course: Sepsis protocol started.  30 cc/kg IV fluid started.  IV antibiotics given.  Initial x-rays showed a right humeral fracture.  Orthopedics consulted.  Patient end up having a CT of the right shoulder including the humerus.  CT shows healing oblique fracture of the surgical neck and proximal shaft of the right humerus.  Patient denies ever having a broken arm.  Patient still unable to move the arm without significant pain.  Review of Systems:  Review of Systems  Constitutional:  Positive for chills and fever.  HENT: Negative.    Eyes: Negative.   Respiratory: Negative.    Cardiovascular: Negative.   Gastrointestinal:  Positive for diarrhea.       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  Genitourinary: Negative.   Musculoskeletal:  Positive for falls.  Skin:  Positive for rash.       Swelling, erythema of the left forearm, left arm and left breast  Neurological:        Disorientation.  Endo/Heme/Allergies: Negative.   Psychiatric/Behavioral: Negative.    All other systems reviewed and are negative.  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   Fever 10/29/2020    Fracture of left clavicle    History of blood transfusion 2013   anemia   Hx of cardiovascular stress test    Myoview (9/15):  inf-apical scar; no ischemia; EF 47% - low risk    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 12/12/2011   Osteoporosis    Pancytopenia 10/07/2011   Pathologic fracture of neck of femur ([place])    Personal history of colonic polyps    Renal insufficiency    hx of yrs ago   Serratia marcescens infection - bactermia assoc w/ PICC 01/18/2015   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HCC)    Splenomegaly    By ultrasound   Stroke ([place]) 07/2020   Thrombophilia ([place])    Vitamin D deficiency     Past Surgical History:  Procedure Laterality Date   APPENDECTOMY  yrs ago   CHOLECYSTECTOMY  yrs ago   COLONOSCOPY  12/05/2005   internal hemorrhoids (for polyp surveillance)   COLONOSCOPY  04/26/2012   Procedure: COLONOSCOPY;  Surgeon: Gatha Mayer, MD;  Location: [facility];  Service: Endoscopy;  Laterality: N/A;   COLONOSCOPY WITH PROPOFOL N/A 03/18/2016   Procedure: COLONOSCOPY WITH PROPOFOL;  Surgeon: Gatha Mayer, MD;  Location: [facility];  Service: Endoscopy;  Laterality: N/A;   ESOPHAGOGASTRODUODENOSCOPY  01/22/2009   erosive esophagitis   HARDWARE REMOVAL Left 12/31/2017   Procedure: HARDWARE REMOVAL;  Surgeon: Rod Can, MD;  Location: [facility];  Service: Orthopedics;  Laterality: Left;   INTRAMEDULLARY (IM) NAIL INTERTROCHANTERIC Left 12/31/2017   Procedure: INTRAMEDULLARY (IM) NAIL SUBTROCHANTRIC;  Surgeon: Rod Can, MD;  Location: [facility];  Service: Orthopedics;  Laterality: Left;   IR CV LINE INJECTION  03/23/2018   IR FLUORO GUIDE CV LINE LEFT  01/01/2018   IR FLUORO GUIDE CV LINE LEFT  03/24/2018   IR FLUORO GUIDE CV LINE LEFT  05/21/2018   IR FLUORO GUIDE CV LINE LEFT  07/09/2018   IR FLUORO GUIDE CV LINE LEFT  12/28/2020   IR FLUORO GUIDE CV LINE RIGHT  11/01/2020   IR GENERIC HISTORICAL  07/04/2016   IR REMOVAL TUN  CV CATH W/O FL 07/04/2016 Ascencion Dike, PA-C [facility]   IR GENERIC HISTORICAL  07/10/2016   IR US GUIDE VASC ACCESS LEFT 07/10/2016 Arne Cleveland, MD [facility]   IR GENERIC HISTORICAL  07/10/2016   IR FLUORO GUIDE CV LINE LEFT 07/10/2016 Arne Cleveland, MD [facility]   IR PATIENT EVAL TECH 0-60 MINS  10/07/2017   IR PATIENT EVAL TECH 0-60 MINS  03/09/2018   IR RADIOLOGIST EVAL & MGMT  07/21/2017   IR RADIOLOGIST EVAL & MGMT  11/14/2020   IR RADIOLOGIST EVAL & MGMT  11/27/2020   IR RADIOLOGIST EVAL & MGMT  01/24/2021   IR REMOVAL TUN CV CATH W/O FL  12/28/2020   IR TRANSCATH PLC STENT  INITIAL VEIN  INC ANGIOPLASTY  12/28/2020   IR US GUIDE VASC ACCESS LEFT  11/01/2020   IR US GUIDE VASC ACCESS LEFT  12/28/2020   IR US GUIDE VASC ACCESS LEFT  12/28/2020   IR US GUIDE VASC ACCESS LEFT  12/28/2020   IR US GUIDE VASC ACCESS RIGHT  11/01/2020   IR VENO/EXT/UNI LEFT  11/01/2020   IR VENOCAVAGRAM SVC  12/28/2020   LEFT HEART CATH AND CORONARY ANGIOGRAPHY N/A 06/26/2020   Procedure: LEFT HEART CATH AND CORONARY ANGIOGRAPHY;  Surgeon: Jolaine Artist, MD;  Location: [place] [facility];  Service: Cardiovascular;  Laterality: N/A;   ORIF PROXIMAL FEMORAL FRACTURE W/ ITST NAIL SYSTEM  03/2007   left, Dr. Shellia Carwin   SMALL INTESTINE SURGERY  2005   multiple with right colon resection for ischemia/infarct     reports that she quit smoking about 17 years ago. Her smoking use included cigarettes. She has never used smokeless tobacco. She reports that she does not drink alcohol and does not use drugs.  Allergies  Allergen Reactions   Penicillins Itching, Swelling and Other (See Comments)    Reaction:  Facial swelling Has patient had a PCN reaction causing immediate rash, facial/tongue/throat swelling, SOB or lightheadedness with hypotension: Yes Has patient had a PCN reaction causing severe rash involving mucus membranes or skin necrosis: No Has patient had a PCN reaction that required hospitalization  No Has patient had a PCN reaction occurring within the last 10 years: No If all of the above answers are "NO", then may proceed with Cephalosporin use.    Family History  Problem Relation Age of Onset   Diabetes Mother    Hypertension Mother    AAA (abdominal aortic aneurysm) Mother    Colon cancer Neg Hx    Stomach cancer Neg Hx     Prior to Admission medications   Medication Sig Start Date End Date Taking? Authorizing Provider  ADULT TPN Inject 1,800 mLs into the vein 4 (four) times a week. Pt receives home TPN from [facility]:  1800 mL bag, four nights weekly (Monday, Tuesday, Wednesday, Thursday for 8 hours (includes 1 hour taper up and down).   Yes [provider]  Calcium Carb-Cholecalciferol (CALCIUM + D3 PO) Take 2 tablets by mouth daily.   Yes [provider]  cetirizine (ZYRTEC) 10 MG tablet Take 1 tablet (10 mg total) by mouth at bedtime as needed for allergies. 12/19/20  Yes Dutch Quint B, FNP  diphenhydrAMINE (BENADRYL) 25 mg capsule Take 25-50 mg by mouth every 6 (six) hours as needed for itching or sleep.    Yes [provider]  empagliflozin (JARDIANCE) 10 MG TABS tablet Take 1 tablet (10 mg total) by mouth daily before breakfast. 06/11/21  Yes Bensimhon, Shaune Pascal, MD  fluticasone (FLONASE) 50 MCG/ACT nasal spray Place 2 sprays into both nostrils daily as needed for allergies or rhinitis.   Yes [provider]  Heparin Sodium, Porcine, (HEPARIN LOCK FLUSH IJ) Inject 5 mLs as directed 4 (four) times a week. Monday, Tuesday, Wednesday, Thursday in the morning with TPN   Yes [provider]  meclizine (ANTIVERT) 25 MG tablet Take 1 tablet (25 mg total) by mouth 3 (three) times daily as needed for dizziness. 04/29/21  Yes Haydee Salter, MD  Multiple Vitamins-Minerals (ADULT GUMMY PO) Take 2 tablets by mouth daily.   Yes [provider]  omeprazole (PRILOSEC) 40 MG capsule TAKE 1 CAPSULE BY MOUTH EVERY DAY BEFORE BREAKFAST  06/28/21  Yes Gatha Mayer, MD  ondansetron (ZOFRAN-ODT) 8 MG disintegrating tablet TAKE 1 TABLET (8 MG TOTAL) BY MOUTH EVERY 8 (EIGHT) HOURS AS NEEDED FOR NAUSEA OR VOMITING. 11/21/20  Yes Gatha Mayer, MD  spironolactone (ALDACTONE) 25 MG tablet Take 0.5 tablets (12.5 mg total) by mouth daily. 12/18/20  Yes Bensimhon, Shaune Pascal, MD  acetaminophen (TYLENOL) 500 MG tablet Take 1,000 mg by mouth daily as needed for mild pain.    [provider]  baclofen (LIORESAL) 10 MG tablet Take 1 tablet (10 mg total) by mouth 2 (two) times daily as needed for muscle spasms. 08/29/20   Frann Rider, NP  furosemide (LASIX) 20 MG tablet TAKE ONE TABLET BY MOUTH AS NEEDED FOR SWELLING 02/11/21   Bensimhon, Shaune Pascal, MD  losartan (COZAAR) 25 MG tablet Take 0.5 tablets (12.5 mg total) by mouth at bedtime. 12/18/20 06/11/22  Bensimhon, Shaune Pascal, MD  sodium chloride 0.9 % infusion Inject 900 mLs into the vein every 14 (fourteen) days. Uses for TPN 10/27/17   [provider]  Teduglutide, rDNA, 5 MG KIT Inject 3.1 application into the skin every morning.    [provider]  triamcinolone cream (KENALOG) 0.1 % Apply 1 application topically as needed.    [provider]  vitamin E 1000 UNIT capsule Take 1,000 Units by mouth daily.    [provider]  XARELTO 20 MG TABS tablet TAKE 1 TABLET (20 MG TOTAL) BY MOUTH DAILY WITH SUPPER. 11/13/20   [name], MD    Physical Exam: Vitals:   06/29/21 1723 06/29/21 1724 06/29/21 1730 06/29/21 1844  BP: (!) 105/59   (!) 85/53  Pulse:  72  73  Resp: _0 Temp:   98.2 F (36.8 C)   TempSrc:   Oral   SpO2:  99%  96%  Weight:      Height:        Physical Exam Vitals and nursing note reviewed.  Constitutional:      General: She is not in acute distress.    Appearance: She is obese. She is not ill-appearing, toxic-appearing or diaphoretic.  HENT:     Head: Normocephalic and atraumatic.     Nose: Nose normal. No  rhinorrhea.  Cardiovascular:     Rate and Rhythm: Normal rate and regular rhythm.     Pulses: Normal pulses.  Pulmonary:     Effort: Pulmonary effort is normal. No respiratory distress.     Breath sounds: No wheezing or rales.  Abdominal:     General: Bowel sounds are normal. There is no distension.     Palpations: Abdomen is soft.     Tenderness: There is no abdominal tenderness. There is no guarding.  Musculoskeletal:     Right lower leg: No edema.     Left lower leg: No edema.  Skin:    Capillary Refill: Capillary refill takes less than 2 seconds.     Comments: See picture of cellulitis left arm, left breast.  Area of erythema outlined after picture was taken  Left tunneled single lumen  CVL  Neurological:     General: No focal deficit present.     Mental Status: She is alert and oriented to person, place, and time.      Labs on Admission: I have personally reviewed following labs and imaging studies  CBC: Recent Labs  Lab 06/29/21 0951  WBC 16.3*  NEUTROABS 14.4*  HGB 10.2*  HCT 31.8*  MCV 89.3  PLT 64*   Basic Metabolic Panel: Recent Labs  Lab 06/29/21 0951  NA 127*  K 3.9  CL 98  CO2 19*  GLUCOSE 101*  BUN 27*  CREATININE 1.83*  CALCIUM 7.7*   GFR: Estimated Creatinine Clearance: 28.4 mL/min (A) (by C-G formula based on SCr of 1.83 mg/dL (H)). Liver Function Tests: Recent Labs  Lab 06/29/21 0951  AST 42*  ALT 23  ALKPHOS 145*  BILITOT 1.1  PROT 8.0  ALBUMIN 2.8*   No results for input(s): LIPASE, AMYLASE in the last 168 hours. No results for input(s): AMMONIA in the last 168 hours. Coagulation Profile: Recent Labs  Lab 06/29/21 0951  INR 4.6*   Cardiac Enzymes: No results for input(s): CKTOTAL, CKMB, CKMBINDEX, TROPONINI in the last 168 hours. BNP (last 3 results) No results for input(s): PROBNP in the last 8760 hours. HbA1C: No results for input(s): HGBA1C in the last 72 hours. CBG: Recent Labs  Lab 06/29/21 0952  GLUCAP 97    Lipid Profile: No results for input(s): CHOL, HDL, LDLCALC, TRIG, CHOLHDL, LDLDIRECT in the last 72 hours. Thyroid Function Tests: No results for input(s): TSH, T4TOTAL, FREET4, T3FREE, THYROIDAB in the last 72 hours. Anemia Panel: No results for input(s): VITAMINB12, FOLATE, FERRITIN, TIBC, IRON, RETICCTPCT in the last 72 hours. Urine analysis:    Component Value Date/Time   COLORURINE YELLOW 06/29/2021 [place] 06/29/2021 1235   LABSPEC <=1.005 06/29/2021 1235   PHURINE 5.0 06/29/2021 1235   GLUCOSEU NEGATIVE 06/29/2021 1235   HGBUR MODERATE (A) 06/29/2021 1235   HGBUR trace-intact 04/12/2010 0958   BILIRUBINUR NEGATIVE 06/29/2021 1235   BILIRUBINUR neg 04/08/2011 0947   KETONESUR NEGATIVE 06/29/2021 1235   PROTEINUR NEGATIVE 06/29/2021 1235   UROBILINOGEN 0.2 09/17/2012 1122   NITRITE NEGATIVE 06/29/2021 1235   LEUKOCYTESUR MODERATE (A) 06/29/2021 1235    Radiological Exams on Admission: I have personally reviewed images DG Shoulder Right  Result Date: 06/29/2021 CLINICAL DATA:  Right shoulder pain. EXAM: RIGHT SHOULDER - 2+ VIEW COMPARISON:  None. FINDINGS: Limited two views study shows deformity of the right humeral neck compatible with comminuted fracture. Fracture line not well demonstrated due to positioning and demineralization. IMPRESSION: Comminuted right humeral neck fracture, poorly demonstrated due to positioning and demineralization. Electronically Signed   By: Misty Stanley M.D.   On: 06/29/2021 12:25   CT SHOULDER RIGHT WO CONTRAST  Result Date: 06/29/2021 CLINICAL DATA:  Shoulder trauma. Earlier same day shoulder radiograph demonstrates right humeral neck fracture. EXAM: CT OF THE UPPER RIGHT EXTREMITY WITHOUT CONTRAST TECHNIQUE: Multidetector CT imaging of the upper right extremity was performed according to the standard protocol. RADIATION DOSE REDUCTION: This exam was performed according to the departmental dose-optimization program which  includes automated exposure control, adjustment of the mA and/or kV according to patient size and/or use of iterative reconstruction technique. COMPARISON:  CT chest 05/15/2021, 11/08/2020; shoulder radiograph 01/10/2020, 10/17/2019 FINDINGS: Bones/Joint/Cartilage Healing oblique fracture of the surgical neck and proximal shaft of the right humerus, not significantly changed compared to prior CT chest 11/08/2020. No acute fracture of the right humerus, scapula, clavicle, or visualized right ribs. No dislocation of the right humeral head. Very mild osteoarthritic changes of the acromioclavicular joint. No joint effusion. Ligaments Suboptimally assessed by CT. Muscles and Tendons No acute abnormality. Soft tissues Unremarkable. No subcutaneous fat stranding or fluid collection in the visualized right upper arm or at the right shoulder. Interval development of a 0.8 cm solid nodule in  the right upper lobe at the apex as well as a 0.6 cm solid nodule at the location of the previously described clustered nodular opacities on recent CT chest 05/15/2021 (series 7, images 46, 63). Trace right pleural fluid with adjacent subsegmental atelectasis. Partially visualized left brachiocephalic/SVC stent with central venous catheter. IMPRESSION: 1. Healing oblique fracture of the surgical neck and proximal shaft of the right humerus, not significantly changed compared to prior CT chest 11/08/2020. No acute osseous abnormality. 2. Unremarkable soft tissues of the visualized right upper arm and right shoulder. 3. Interval development of 2 solid pulmonary nodules in the right upper lobe since recent CT chest 05/15/2021, favored to be infectious or inflammatory given waxing and waning nodules on prior cross-sectional imaging. Non-contrast chest CT at 3-6 months is recommended. If the nodules are stable at time of repeat CT, then future CT at 18-24 months (from today's scan) is considered optional for low-risk patients, but is  recommended for high-risk patients. This recommendation follows the consensus statement: Guidelines for Management of Incidental Pulmonary Nodules Detected on CT Images: From the Fleischner Society 2017; Radiology 2017; 284:228-243. Electronically Signed   By: Ileana Roup M.D.   On: 06/29/2021 18:09   DG Chest Port 1 View  Result Date: 06/29/2021 CLINICAL DATA:  Feeling bad. EXAM: PORTABLE CHEST 1 VIEW COMPARISON:  06/11/2021 FINDINGS: Or 959 hours. The cardio pericardial silhouette is enlarged. Interstitial markings are diffusely coarsened with chronic features. Minimal atelectasis noted both lung bases. Possible tiny effusions. Left-sided central line remains in place. Bones are diffusely demineralized. Telemetry leads overlie the chest. IMPRESSION: Low volume film with bibasilar atelectasis. Possible tiny bilateral pleural effusions. Electronically Signed   By: Misty Stanley M.D.   On: 06/29/2021 10:05    EKG: I have personally reviewed EKG: afib    Assessment/Plan Principal Problem:   Sepsis ([place]) Active Problems:   Left arm cellulitis   Cellulitis of left breast   AKI (acute kidney injury) ([place])   Hyponatremia   Humerus fracture - right   New onset a-fib (HCC)   Acquired short bowel syndrome   GERD (gastroesophageal reflux disease)   Chronic systolic heart failure (HCC) - LVEF 35-40%   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HCC)   Chronic renal failure, stage 3a (HCC)    Assessment and Plan: * Sepsis ([place])- (present on admission) Admit to stepdown unit.  Patient is received 30 cc/kg of IV fluids.  Systolic blood pressure around 95.  If this continues to be low with maps less than 65, she will need to start peripheral vasopressors.  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  She currently has a tunneled left subclavian but given her cellulitis of her left breast and left arm, this could be infected.  She has a right EJ peripheral IV.  That is  her only source of access at this time.  We possibly could put a right femoral central line if needed but this would be a last resort.  I suspect her sepsis is due to her cellulitis of her left breast and left arm.  The source of the cellulitis however could also be from her central catheter.  Cannot rule out abscess in her left upper arm/forearm as it swollen.  Order CT noncontrasted to better evaluate this.  We will also need left upper extremity ultrasound to rule out DVT.  Patient has been on Xarelto.      Cellulitis of left breast Continue Rocephin and vancomycin.  Left arm cellulitis Continue IV antibiotics Rocephin and vancomycin.  New onset a-fib [facility]) Could be due to sepsis/cellulitis. Rate controlled at this point. On xarelto. Check echo. Check TSH.  Humerus fracture - right Orthopedics has been notified and will see the patient tomorrow.  Chronic renal failure, stage 3a ([place])- (present on admission) Stable.  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[place])- (present on admission) Patient is on Xarelto at home.  Chronic systolic heart failure (HCC) - LVEF 35-40%- (present on admission) Patient is ready received 30 cc/kg of IV fluids.  We will need to monitor for volume overload.  If her pressures continue to be low, will need to start peripheral vasopressors.  Start with IV Levophed.  GERD (gastroesophageal reflux disease)- (present on admission) Continue PPI  Acquired short bowel syndrome- (present on admission) Chronic.  Patient on TPN 4-5 nights a week.  She is on it via a left tunneled subclavian catheter.  This was placed back in September 2022 with great difficulty.  Patient's been on TPN for the last 15 years due to bowel resection.  Family does not know her TPN prescription.  We will place her on D10 half-normal saline for now via her right EJ peripheral IV.  Family will need to bring in her TPN prescription.  Will need pharmacy to help with peripheral TPN in the morning.  Patient  still does eat by mouth but does have chronic diarrhea because of her short gut.    DVT prophylaxis: Xarelto Code Status:  partial. Pt would NOT want CPR but okay with all other forms of ACLS protocol including intubation, ACLS meds, cardioversion/defibrillation. Family Communication: discussed with pt, dtr and husband at bedside  Disposition Plan: return home  Consults called: orthopedics  Admission status: Inpatient, Step Down Unit   Kristopher Oppenheim, DO [facility] 06/29/2021, 7:50 PM

## 2021-06-29 NOTE — Assessment & Plan Note (Addendum)
Chronic.  Patient on TPN 4-5 nights a week.  She is on it via a left tunneled subclavian catheter.  This was placed back in September 2022 with great difficulty.  Patient's been on TPN for the last 15 years due to bowel resection. Patient still does eat by mouth but does have chronic diarrhea because of her short gut. -TPN can be resumed after replacing central catheter. -Patient will need IV antibiotics as short gut syndrome will interfere with antibiotic absorption

## 2021-06-29 NOTE — Assessment & Plan Note (Signed)
Could be due to sepsis/cellulitis. Rate controlled at this point. On xarelto. Check echo. Check TSH.

## 2021-06-30 ENCOUNTER — Other Ambulatory Visit (HOSPITAL_COMMUNITY): Payer: Medicare Other

## 2021-06-30 DIAGNOSIS — A491 Streptococcal infection, unspecified site: Secondary | ICD-10-CM

## 2021-06-30 DIAGNOSIS — S42294K Other nondisplaced fracture of upper end of right humerus, subsequent encounter for fracture with nonunion: Secondary | ICD-10-CM

## 2021-06-30 DIAGNOSIS — A409 Streptococcal sepsis, unspecified: Secondary | ICD-10-CM | POA: Diagnosis present

## 2021-06-30 DIAGNOSIS — K912 Postsurgical malabsorption, not elsewhere classified: Secondary | ICD-10-CM

## 2021-06-30 DIAGNOSIS — N179 Acute kidney failure, unspecified: Secondary | ICD-10-CM

## 2021-06-30 DIAGNOSIS — N61 Mastitis without abscess: Secondary | ICD-10-CM | POA: Diagnosis not present

## 2021-06-30 DIAGNOSIS — R6520 Severe sepsis without septic shock: Secondary | ICD-10-CM

## 2021-06-30 DIAGNOSIS — L03114 Cellulitis of left upper limb: Secondary | ICD-10-CM

## 2021-06-30 DIAGNOSIS — A419 Sepsis, unspecified organism: Secondary | ICD-10-CM | POA: Diagnosis not present

## 2021-06-30 LAB — COMPREHENSIVE METABOLIC PANEL
ALT: 16 U/L (ref 0–44)
AST: 28 U/L (ref 15–41)
Albumin: 2.2 g/dL — ABNORMAL LOW (ref 3.5–5.0)
Alkaline Phosphatase: 103 U/L (ref 38–126)
Anion gap: 6 (ref 5–15)
BUN: 24 mg/dL — ABNORMAL HIGH (ref 8–23)
CO2: 23 mmol/L (ref 22–32)
Calcium: 7.5 mg/dL — ABNORMAL LOW (ref 8.9–10.3)
Chloride: 107 mmol/L (ref 98–111)
Creatinine, Ser: 1.44 mg/dL — ABNORMAL HIGH (ref 0.44–1.00)
GFR, Estimated: 39 mL/min — ABNORMAL LOW (ref >60)
Glucose, Bld: 103 mg/dL — ABNORMAL HIGH (ref 70–99)
Potassium: 3.9 mmol/L (ref 3.5–5.1)
Sodium: 136 mmol/L (ref 135–145)
Total Bilirubin: 0.8 mg/dL (ref 0.3–1.2)
Total Protein: 6.7 g/dL (ref 6.5–8.1)

## 2021-06-30 LAB — CBC WITH DIFFERENTIAL/PLATELET
Abs Immature Granulocytes: 0.08 10*3/uL — ABNORMAL HIGH (ref 0.00–0.07)
Basophils Absolute: 0 10*3/uL (ref 0.0–0.1)
Basophils Relative: 0 %
Eosinophils Absolute: 0.1 10*3/uL (ref 0.0–0.5)
Eosinophils Relative: 1 %
HCT: 28.5 % — ABNORMAL LOW (ref 36.0–46.0)
Hemoglobin: 9 g/dL — ABNORMAL LOW (ref 12.0–15.0)
Immature Granulocytes: 1 %
Lymphocytes Relative: 10 %
Lymphs Abs: 1.1 10*3/uL (ref 0.7–4.0)
MCH: 28.8 pg (ref 26.0–34.0)
MCHC: 31.6 g/dL (ref 30.0–36.0)
MCV: 91.3 fL (ref 80.0–100.0)
Monocytes Absolute: 0.9 10*3/uL (ref 0.1–1.0)
Monocytes Relative: 8 %
Neutro Abs: 9 10*3/uL — ABNORMAL HIGH (ref 1.7–7.7)
Neutrophils Relative %: 80 %
Platelets: 72 10*3/uL — ABNORMAL LOW (ref 150–400)
RBC: 3.12 MIL/uL — ABNORMAL LOW (ref 3.87–5.11)
RDW: 16.3 % — ABNORMAL HIGH (ref 11.5–15.5)
WBC: 11.1 10*3/uL — ABNORMAL HIGH (ref 4.0–10.5)
nRBC: 0 % (ref 0.0–0.2)

## 2021-06-30 LAB — BLOOD CULTURE ID PANEL (REFLEXED) - BCID2

## 2021-06-30 LAB — MAGNESIUM: Magnesium: 1.8 mg/dL (ref 1.7–2.4)

## 2021-06-30 LAB — TSH: TSH: 2.195 u[IU]/mL (ref 0.350–4.500)

## 2021-06-30 LAB — C DIFFICILE QUICK SCREEN W PCR REFLEX
C Diff antigen: NEGATIVE
C Diff interpretation: NOT DETECTED
C Diff toxin: NEGATIVE

## 2021-06-30 MED ORDER — SODIUM CHLORIDE 0.9 % IV BOLUS
250.0000 mL | Freq: Once | INTRAVENOUS | Status: AC
  Administered 2021-06-30: 250 mL via INTRAVENOUS

## 2021-06-30 MED ORDER — CEFAZOLIN SODIUM-DEXTROSE 2-4 GM/100ML-% IV SOLN
2.0000 g | Freq: Three times a day (TID) | INTRAVENOUS | Status: DC
  Administered 2021-06-30 – 2021-07-01 (×4): 2 g via INTRAVENOUS
  Filled 2021-06-30 (×5): qty 100

## 2021-06-30 MED ORDER — CLINDAMYCIN PHOSPHATE 600 MG/50ML IV SOLN
600.0000 mg | Freq: Three times a day (TID) | INTRAVENOUS | Status: DC
  Administered 2021-06-30 – 2021-07-01 (×4): 600 mg via INTRAVENOUS
  Filled 2021-06-30 (×4): qty 50

## 2021-06-30 MED ORDER — DIPHENHYDRAMINE HCL 25 MG PO CAPS
25.0000 mg | ORAL_CAPSULE | Freq: Three times a day (TID) | ORAL | Status: DC | PRN
  Administered 2021-06-30 – 2021-07-02 (×3): 25 mg via ORAL
  Filled 2021-06-30 (×3): qty 1

## 2021-06-30 NOTE — Progress Notes (Signed)
PHARMACY - PHYSICIAN COMMUNICATION CRITICAL VALUE ALERT - BLOOD CULTURE IDENTIFICATION (BCID)  [name] [name] is an 71 y.o. female who presented to [facility] on 06/29/2021 with sepsis due to cellulitis  Assessment:  BCID + for Strep pyogenes in 2 out of 4 bottles (1 complete set)  Name of physician (or Provider) Contacted: Dr Bridgett Larsson  Current antibiotics: Ceftriaxone and Vancomycin  Changes to prescribed antibiotics recommended:  Recommendations accepted by provider - D/C vancomycin and Ceftriaxone - Begin Cefazolin 2gm IV q8h (Cefazolin instead of Penicillin G due to patient's pcn allergy and has tolerated cephalosporins) and Clindamycin 600mg  IV q8h  Results for orders placed or performed during the hospital encounter of 06/29/21  Blood Culture ID Panel (Reflexed) (Collected: 06/29/2021  9:51 AM)  Result Value Ref Range   Enterococcus faecalis NOT DETECTED NOT DETECTED   Enterococcus Faecium NOT DETECTED NOT DETECTED   Listeria monocytogenes NOT DETECTED NOT DETECTED   Staphylococcus species NOT DETECTED NOT DETECTED   Staphylococcus aureus (BCID) NOT DETECTED NOT DETECTED   Staphylococcus epidermidis NOT DETECTED NOT DETECTED   Staphylococcus lugdunensis NOT DETECTED NOT DETECTED   Streptococcus species DETECTED (A) NOT DETECTED   Streptococcus agalactiae NOT DETECTED NOT DETECTED   Streptococcus pneumoniae NOT DETECTED NOT DETECTED   Streptococcus pyogenes DETECTED (A) NOT DETECTED   A.calcoaceticus-baumannii NOT DETECTED NOT DETECTED   Bacteroides fragilis NOT DETECTED NOT DETECTED   Enterobacterales NOT DETECTED NOT DETECTED   Enterobacter cloacae complex NOT DETECTED NOT DETECTED   Escherichia coli NOT DETECTED NOT DETECTED   Klebsiella aerogenes NOT DETECTED NOT DETECTED   Klebsiella oxytoca NOT DETECTED NOT DETECTED   Klebsiella pneumoniae NOT DETECTED NOT DETECTED   Proteus species NOT DETECTED NOT DETECTED   Salmonella species NOT DETECTED NOT DETECTED   Serratia  marcescens NOT DETECTED NOT DETECTED   Haemophilus influenzae NOT DETECTED NOT DETECTED   Neisseria meningitidis NOT DETECTED NOT DETECTED   Pseudomonas aeruginosa NOT DETECTED NOT DETECTED   Stenotrophomonas maltophilia NOT DETECTED NOT DETECTED   Candida albicans NOT DETECTED NOT DETECTED   Candida auris NOT DETECTED NOT DETECTED   Candida glabrata NOT DETECTED NOT DETECTED   Candida krusei NOT DETECTED NOT DETECTED   Candida parapsilosis NOT DETECTED NOT DETECTED   Candida tropicalis NOT DETECTED NOT DETECTED   Cryptococcus neoformans/gattii NOT DETECTED NOT DETECTED    Everette Rank, PharmD 06/30/2021  5:04 AM

## 2021-06-30 NOTE — Progress Notes (Signed)
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

## 2021-06-30 NOTE — Consult Note (Signed)
ORTHOPAEDIC CONSULTATION  REQUESTING PHYSICIAN: Florencia Reasons, MD  Chief Complaint: right proximal humerus fracture  HPI: [name] [name] is a 71 y.o. 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 She states it started when she was using a heating pad on her left arm and she accidentally slept with it on. It began to spread up her arm. Her family took her to [facility] ER. She was found to be septic and have a right proximal humerus fracture.   Patient seen in 1241. States she has not had a recent injury to her right arm. She states a few years ago she did have a fall which led to a "broken collarbone." But no recent injuries to the right arm. She is not having pain in the right arm. Pain in the left arm due to cellulitis. No other extremity issues.   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   Fever 10/29/2020   Fracture of left clavicle    History of blood transfusion 2013   anemia   Hx of cardiovascular stress test    Myoview (9/15):  inf-apical scar; no  ischemia; EF 47% - low risk    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 12/12/2011   Osteoporosis    Pancytopenia 10/07/2011   Pathologic fracture of neck of femur ([place])    Personal history of colonic polyps    Renal insufficiency    hx of yrs ago   Serratia marcescens infection - bactermia assoc w/ PICC 01/18/2015   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HCC)    Splenomegaly    By ultrasound   Stroke ([place]) 07/2020   Thrombophilia ([place])    Vitamin D deficiency    Past Surgical History:  Procedure Laterality Date   APPENDECTOMY  yrs ago   CHOLECYSTECTOMY  yrs ago   COLONOSCOPY  12/05/2005   internal hemorrhoids (for polyp surveillance)   COLONOSCOPY  04/26/2012   Procedure: COLONOSCOPY;  Surgeon: Gatha Mayer, MD;  Location: [facility];  Service: Endoscopy;  Laterality: N/A;   COLONOSCOPY WITH PROPOFOL N/A 03/18/2016   Procedure: COLONOSCOPY WITH PROPOFOL;  Surgeon: Gatha Mayer, MD;  Location: [facility];  Service: Endoscopy;  Laterality: N/A;   ESOPHAGOGASTRODUODENOSCOPY  01/22/2009   erosive esophagitis   HARDWARE REMOVAL Left 12/31/2017   Procedure: HARDWARE REMOVAL;  Surgeon: Rod Can, MD;  Location: [facility];  Service: Orthopedics;  Laterality: Left;   INTRAMEDULLARY (IM) NAIL  INTERTROCHANTERIC Left 12/31/2017   Procedure: INTRAMEDULLARY (IM) NAIL SUBTROCHANTRIC;  Surgeon: Samson Frederic, MD;  Location: [facility];  Service: Orthopedics;  Laterality: Left;   IR CV LINE INJECTION  03/23/2018   IR FLUORO GUIDE CV LINE LEFT  01/01/2018   IR FLUORO GUIDE CV LINE LEFT  03/24/2018   IR FLUORO GUIDE CV LINE LEFT  05/21/2018   IR FLUORO GUIDE CV LINE LEFT  07/09/2018   IR FLUORO GUIDE CV LINE LEFT  12/28/2020   IR FLUORO GUIDE CV LINE RIGHT  11/01/2020   IR GENERIC HISTORICAL  07/04/2016   IR REMOVAL TUN CV CATH W/O FL 07/04/2016 Brayton El, PA-C [facility]   IR GENERIC HISTORICAL  07/10/2016   IR US GUIDE VASC ACCESS LEFT 07/10/2016 Oley Balm, MD  [facility]   IR GENERIC HISTORICAL  07/10/2016   IR FLUORO GUIDE CV LINE LEFT 07/10/2016 Oley Balm, MD [facility]   IR PATIENT EVAL TECH 0-60 MINS  10/07/2017   IR PATIENT EVAL TECH 0-60 MINS  03/09/2018   IR RADIOLOGIST EVAL & MGMT  07/21/2017   IR RADIOLOGIST EVAL & MGMT  11/14/2020   IR RADIOLOGIST EVAL & MGMT  11/27/2020   IR RADIOLOGIST EVAL & MGMT  01/24/2021   IR REMOVAL TUN CV CATH W/O FL  12/28/2020   IR TRANSCATH PLC STENT  INITIAL VEIN  INC ANGIOPLASTY  12/28/2020   IR US GUIDE VASC ACCESS LEFT  11/01/2020   IR US GUIDE VASC ACCESS LEFT  12/28/2020   IR US GUIDE VASC ACCESS LEFT  12/28/2020   IR US GUIDE VASC ACCESS LEFT  12/28/2020   IR US GUIDE VASC ACCESS RIGHT  11/01/2020   IR VENO/EXT/UNI LEFT  11/01/2020   IR VENOCAVAGRAM SVC  12/28/2020   LEFT HEART CATH AND CORONARY ANGIOGRAPHY N/A 06/26/2020   Procedure: LEFT HEART CATH AND CORONARY ANGIOGRAPHY;  Surgeon: Dolores Patty, MD;  Location: [facility];  Service: Cardiovascular;  Laterality: N/A;   ORIF PROXIMAL FEMORAL FRACTURE W/ ITST NAIL SYSTEM  03/2007   left, Dr. Simonne Come   SMALL INTESTINE SURGERY  2005   multiple with right colon resection for ischemia/infarct   Social History   Socioeconomic History   Marital status: Married    Spouse name: Not on file   Number of children: 1   Years of education: Not on file   Highest education level: Not on file  Occupational History   Not on file  Tobacco Use   Smoking status: Former    Types: Cigarettes    Quit date: 05/12/2004    Years since quitting: 17.1   Smokeless tobacco: Never  Vaping Use   Vaping Use: Never used  Substance and Sexual Activity   Alcohol use: No   Drug use: No   Sexual activity: Yes    Comment: 1st intercourse 72 yo-5 partners  Other Topics Concern   Not on file  Social History Narrative   Married to [place], has 1 daughter and a granddaughter the patient's son died in childhood due to a motor vehicle wreck   Disabled due to  illness 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   No alcohol tobacco or drug use   04/07/2017   Social Determinants of Health   Financial Resource Strain: Not on file  Food Insecurity: Not on file  Transportation Needs: Not on file  Physical Activity: Not on file  Stress: Not on file  Social Connections: Not on file  Family History  Problem Relation Age of Onset   Diabetes Mother    Hypertension Mother    AAA (abdominal aortic aneurysm) Mother    Colon cancer Neg Hx    Stomach cancer Neg Hx    Allergies  Allergen Reactions   Penicillins Itching, Swelling and Other (See Comments)    Reaction:  Facial swelling Has patient had a PCN reaction causing immediate rash, facial/tongue/throat swelling, SOB or lightheadedness with hypotension: Yes Has patient had a PCN reaction causing severe rash involving mucus membranes or skin necrosis: No Has patient had a PCN reaction that required hospitalization No Has patient had a PCN reaction occurring within the last 10 years: No If all of the above answers are "NO", then may proceed with Cephalosporin use.   Prior to Admission medications   Medication Sig Start Date End Date Taking? Authorizing Provider  acetaminophen (TYLENOL) 500 MG tablet Take 1,000 mg by mouth daily as needed for mild pain.   Yes [provider]  ADULT TPN Inject 1,800 mLs into the vein 4 (four) times a week. Pt receives home TPN from [facility]:  1800 mL bag, four nights weekly (Monday, Tuesday, Wednesday, Thursday for 8 hours (includes 1 hour taper up and down).   Yes [provider]  baclofen (LIORESAL) 10 MG tablet Take 1 tablet (10 mg total) by mouth 2 (two) times daily as needed for muscle spasms. 08/29/20  Yes McCue, Janett Billow, NP  Calcium Carb-Cholecalciferol (CALCIUM + D3 PO) Take 2 tablets by mouth daily.   Yes [provider]  cetirizine (ZYRTEC) 10 MG tablet Take 1 tablet (10 mg total) by mouth at bedtime as needed for  allergies. 12/19/20  Yes Dutch Quint B, FNP  clobetasol ointment (TEMOVATE) 6.96 % Apply 1 application topically 2 (two) times daily. 06/14/21  Yes [provider]  diphenhydrAMINE (BENADRYL) 25 mg capsule Take 25-50 mg by mouth every 6 (six) hours as needed for itching or sleep.    Yes [provider]  empagliflozin (JARDIANCE) 10 MG TABS tablet Take 1 tablet (10 mg total) by mouth daily before breakfast. 06/11/21  Yes Bensimhon, Shaune Pascal, MD  fluticasone (FLONASE) 50 MCG/ACT nasal spray Place 2 sprays into both nostrils daily as needed for allergies or rhinitis.   Yes [provider]  furosemide (LASIX) 20 MG tablet TAKE ONE TABLET BY MOUTH AS NEEDED FOR SWELLING 02/11/21  Yes Bensimhon, Shaune Pascal, MD  Heparin Sodium, Porcine, (HEPARIN LOCK FLUSH IJ) Inject 5 mLs as directed 4 (four) times a week. Monday, Tuesday, Wednesday, Thursday in the morning with TPN   Yes [provider]  losartan (COZAAR) 25 MG tablet Take 0.5 tablets (12.5 mg total) by mouth at bedtime. 12/18/20 06/11/22 Yes Bensimhon, Shaune Pascal, MD  meclizine (ANTIVERT) 25 MG tablet Take 1 tablet (25 mg total) by mouth 3 (three) times daily as needed for dizziness. 04/29/21  Yes Haydee Salter, MD  Multiple Vitamins-Minerals (ADULT GUMMY PO) Take 2 tablets by mouth daily.   Yes [provider]  omeprazole (PRILOSEC) 40 MG capsule TAKE 1 CAPSULE BY MOUTH EVERY DAY BEFORE BREAKFAST 06/28/21  Yes Gatha Mayer, MD  ondansetron (ZOFRAN-ODT) 8 MG disintegrating tablet TAKE 1 TABLET (8 MG TOTAL) BY MOUTH EVERY 8 (EIGHT) HOURS AS NEEDED FOR NAUSEA OR VOMITING. 11/21/20  Yes Gatha Mayer, MD  sodium chloride 0.9 % infusion Inject 900 mLs into the vein every 14 (fourteen) days. Uses for TPN 10/27/17  Yes [provider]  spironolactone (ALDACTONE) 25 MG tablet Take 0.5 tablets (12.5 mg total) by mouth daily. 12/18/20  Yes Bensimhon, Shaune Pascal, MD  Teduglutide, rDNA, 5 MG KIT Inject 3.1 application into  the skin every morning.   Yes [provider]  triamcinolone cream (KENALOG) 0.1 % Apply 1 application topically as needed (itching).   Yes [provider]  vitamin E 1000 UNIT capsule Take 1,000 Units by mouth daily.   Yes [provider]  XARELTO 20 MG TABS tablet TAKE 1 TABLET (20 MG TOTAL) BY MOUTH DAILY WITH SUPPER. 11/13/20  Yes [name], MD   DG Shoulder Right  Result Date: 06/29/2021 CLINICAL DATA:  Right shoulder pain. EXAM: RIGHT SHOULDER - 2+ VIEW COMPARISON:  None. FINDINGS: Limited two views study shows deformity of the right humeral neck compatible with comminuted fracture. Fracture line not well demonstrated due to positioning and demineralization. IMPRESSION: Comminuted right humeral neck fracture, poorly demonstrated due to positioning and demineralization. Electronically Signed   By: Misty Stanley M.D.   On: 06/29/2021 12:25   CT SHOULDER RIGHT WO CONTRAST  Result Date: 06/29/2021 CLINICAL DATA:  Shoulder trauma. Earlier same day shoulder radiograph demonstrates right humeral neck fracture. EXAM: CT OF THE UPPER RIGHT EXTREMITY WITHOUT CONTRAST TECHNIQUE: Multidetector CT imaging of the upper right extremity was performed according to the standard protocol. RADIATION DOSE REDUCTION: This exam was performed according to the departmental dose-optimization program which includes automated exposure control, adjustment of the mA and/or kV according to patient size and/or use of iterative reconstruction technique. COMPARISON:  CT chest 05/15/2021, 11/08/2020; shoulder radiograph 01/10/2020, 10/17/2019 FINDINGS: Bones/Joint/Cartilage Healing oblique fracture of the surgical neck and proximal shaft of the right humerus, not significantly changed compared to prior CT chest 11/08/2020. No acute fracture of the right humerus, scapula, clavicle, or visualized right ribs. No dislocation of the right humeral head. Very mild osteoarthritic changes of the  acromioclavicular joint. No joint effusion. Ligaments Suboptimally assessed by CT. Muscles and Tendons No acute abnormality. Soft tissues Unremarkable. No subcutaneous fat stranding or fluid collection in the visualized right upper arm or at the right shoulder. Interval development of a 0.8 cm solid nodule in the right upper lobe at the apex as well as a 0.6 cm solid nodule at the location of the previously described clustered nodular opacities on recent CT chest 05/15/2021 (series 7, images 46, 63). Trace right pleural fluid with adjacent subsegmental atelectasis. Partially visualized left brachiocephalic/SVC stent with central venous catheter. IMPRESSION: 1. Healing oblique fracture of the surgical neck and proximal shaft of the right humerus, not significantly changed compared to prior CT chest 11/08/2020. No acute osseous abnormality. 2. Unremarkable soft tissues of the visualized right upper arm and right shoulder. 3. Interval development of 2 solid pulmonary nodules in the right upper lobe since recent CT chest 05/15/2021, favored to be infectious or inflammatory given waxing and waning nodules on prior cross-sectional imaging. Non-contrast chest CT at 3-6 months is recommended. If the nodules are stable at time of repeat CT, then future CT at 18-24 months (from today's scan) is considered optional for low-risk patients, but is recommended for high-risk patients. This recommendation follows the consensus statement: Guidelines for Management of Incidental Pulmonary Nodules Detected on CT Images: From the Fleischner Society 2017; Radiology 2017; 284:228-243. Electronically Signed   By: Ileana Roup M.D.   On: 06/29/2021 18:09   DG Chest Port 1 View  Result Date: 06/29/2021 CLINICAL DATA:  Feeling bad. EXAM: PORTABLE CHEST 1 VIEW COMPARISON:  06/11/2021 FINDINGS: Or 959 hours. The cardio pericardial silhouette is enlarged. Interstitial markings are diffusely coarsened with chronic features. Minimal  atelectasis noted both lung bases. Possible tiny effusions. Left-sided central line remains in place. Bones are diffusely demineralized. Telemetry leads overlie the chest. IMPRESSION: Low volume film with bibasilar atelectasis. Possible tiny bilateral pleural effusions. Electronically Signed   By: Misty Stanley M.D.   On: 06/29/2021 10:05   CT Extrem Up Entire Arm L WO/CM  Result Date: 06/29/2021 CLINICAL DATA:  Left upper extremity pain and swelling, initial encounter EXAM: CT OF THE UPPER LEFT EXTREMITY WITHOUT CONTRAST TECHNIQUE: Multidetector CT imaging of the upper left extremity was performed according to the standard protocol. RADIATION DOSE REDUCTION: This exam was performed according to the departmental dose-optimization program which includes automated exposure control, adjustment of the mA and/or kV according to patient size and/or use of iterative reconstruction technique. COMPARISON:  None. FINDINGS: Bones/Joint/Cartilage No acute fracture or dislocation is noted. Postsurgical changes in the proximal left femur are noted included on the distal aspect of the upper extremity. Underlying bony thorax shows no rib fractures. Ligaments Suboptimally assessed by CT. Muscles and Tendons Musculature of the left upper extremity appears within normal limits. No focal fluid collection is identified. Soft tissues Very mild edematous changes are noted in the proximal forearm and distal upper arm as well as in the left breast consistent with the given clinical history. These are most prominent in the region of the olecranon in the left elbow. No focal fluid collection to suggest abscess is noted. These changes are most consistent with cellulitis. Some reactive lymph nodes are noted in the left axilla consistent with the peripheral inflammatory change. IMPRESSION: Soft tissue swelling in the left upper extremity left breast similar to that noted on physical exam consistent with focal study lightest. No abscess is  noted. Some reactive lymphadenopathy is seen in the left axilla. No bony abnormality is noted. Electronically Signed   By: Inez Catalina M.D.   On: 06/29/2021 22:14   Family History Reviewed and non-contributory, no pertinent history of problems with bleeding or anesthesia      Review of Systems 14 system ROS conducted and negative except for that noted in HPI   OBJECTIVE  Vitals:Patient Vitals for the past 8 hrs:  BP Temp Temp src Pulse Resp SpO2  06/30/21 0900 (!) 102/49 -- -- (!) 56 17 100 %  06/30/21 0828 -- 97.7 F (36.5 C) Oral -- -- --  06/30/21 0800 (!) 113/57 -- -- 69 (!) 22 98 %  06/30/21 0700 (!) 96/43 -- -- 64 17 (!) 88 %  06/30/21 0400 -- 97.8 F (36.6 C) Oral -- -- --   General: Alert, no acute distress Cardiovascular: Warm extremities noted Respiratory: No cyanosis, no use of accessory musculature GI: No organomegaly, abdomen is soft and non-tender Skin: No lesions in the area of chief complaint other than those listed below in MSK exam.  Neurologic: Sensation intact distally save for the below mentioned MSK exam Psychiatric: Patient is competent for consent with normal mood and affect Lymphatic: No swelling obvious and reported other than the area involved in the exam below  Extremities  RUE: nontender to palpation right shoulder, elbow, wrist, hand. Actively moves right shoulder without pain. No pain with cuff strength testing. Distal motor and sensory function intact.  LUE: see images from hospitalist team. Cellulitis of the left upper arm and left breast. No abscess noted. Actively moves left shoulder, elbow, wrist, hand. Some pain  with elbow ROM. Distal motor and sensory function intact.   Bilateral lower extremity: actively moves lower extremity without pain. NVI    Test Results Imaging CT of right shoulder demonstrates an old proximal humerus fracture (previously noted in CT in 11/08/2020)  CT of left arm demonstrates cellulitis. No focal fluid collection  or abscess noted.   Labs cbc Recent Labs    06/29/21 0951 06/30/21 0250  WBC 16.3* 11.1*  HGB 10.2* 9.0*  HCT 31.8* 28.5*  PLT 64* 72*    Labs inflam No results for input(s): CRP in the last 72 hours.  Invalid input(s): ESR  Labs coag Recent Labs    06/29/21 0951  INR 4.6*    Recent Labs    06/29/21 0951 06/30/21 0250  NA 127* 136  K 3.9 3.9  CL 98 107  CO2 19* 23  GLUCOSE 101* 103*  BUN 27* 24*  CREATININE 1.83* 1.44*  CALCIUM 7.7* 7.5*     ASSESSMENT AND PLAN: 71 y.o. female with the following: old right proximal humerus fracture, left arm cellulitis  This patient requires inpatient admission to manage this problem appropriately. Patient has been admitted for sepsis and is undergoing treatment with IV antibiotics.  Patient's right proximal humerus fracture is not an acute injury. She is not having any pain in the right arm. We discussed a sling for comfort, but this is not required. No restrictions for the right arm.   Continue IV antibiotics for left arm cellulitis. No need for surgical intervention at this time.   Noemi Chapel, PA-C 06/30/2021

## 2021-06-30 NOTE — Progress Notes (Signed)
Pt refused SCDs 

## 2021-06-30 NOTE — Progress Notes (Signed)
Patient brought in home TPN. Home TPN contains 1800 mL with 100 mL of overflow. TPN came with 2 vials (25mL) of Infuvite adult. TPN contents: Dextrose 70% 166.66 GM/L or 300GM total. Travsaol 10% 44.44 gm/L or 80 gm total. Water for injection 61.39 mL/L or 110.51 mL total. Clinolipid 20% 36.11 GM/L or 65 GM total. Pot. Phosphate 7.78 mM/L or 14.01 mM total. Ca. Gluconate 2.78 mEq/L or 5 mEq total. Pot. Acetate 27.22 mEq/L or 5mEq total. Magnesium sulfa 8.33 mEq/ L or 15 mEq total. NaCL ( 37mEq/mL) 58.33 mEq/L or 105 mEq total. Na Acetate 69.44 mEq/L or 125 mEq total. Tralement 0.56 mL/L or 1.00 mL. Levocaritine 277.78 mg/L or 500mg  total.

## 2021-06-30 NOTE — Consult Note (Signed)
[name] [name] for Infectious Disease    Date of Admission:  06/29/2021     Reason for Consult: Strep pyogenes bacteremia     Referring Physician: Dr Erlinda Hong  Current antibiotics: Cefazolin 2/18-present Clindamycin 2/19-present  Previous antibiotics: Ceftriaxone 2/18 Vancomycin 2/18   ASSESSMENT:    71 y.o. female admitted with:  Strep pyogenes bacteremia: Secondary to left upper extremity cellulitis noted on imaging and exam.  No evidence of abscess or necrotizing infection.  Appears to be clinically improved on current antibiotic regimen. 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  Per review of IR notes, this was a complicated access situation which would likely make line removal and holiday difficult. Chronic diarrhea: Patient reports that she always has diarrhea and this is unchanged. Right humerus fracture: Seen by orthopedic surgery with no plans for intervention at this time. Acute kidney injury: Presented with creatinine 1.83.  Improved to 1.4 today. Penicillin allergy Severe sepsis: Secondary to #1.  RECOMMENDATIONS:    Continue cefazolin and clindamycin in the setting of strep pyogenes cellulitis complicated by bacteremia. Monitor her left upper extremity closely for any clinical worsening although improved today. Discussed with patient her penicillin allergy.  Would probably feel comfortable giving her penicillin based on her description.  Pharmacy will be back tomorrow to discuss desensitization with her.  She feels comfortable with trying penicillin if deemed safe to do so. Repeat blood cultures. TTE has been ordered and pending. Sounds like she has chronic diarrhea due to TPN.  C. difficile testing and GI PCR panel was ordered by the admitting team and is pending. Ideally, she would have line removal with appropriate holiday.  However, her vascular access appears to be quite challenging and  she herself is quite adamant about not having her line removed.  Will ask IR to see to discuss the possibility of at least doing a line exchange over a wire if safe to do. Will follow.  Dr Linus Salmons back tomorrow.   Principal Problem:   Sepsis ([place]) Active Problems:   Acquired short bowel syndrome   GERD (gastroesophageal reflux disease)   AKI (acute kidney injury) ([place])   Hyponatremia   Chronic systolic heart failure (HCC) - LVEF 35-40%   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HCC)   Chronic renal failure, stage 3a (HCC)   Left arm cellulitis   Cellulitis of left breast   Humerus fracture - right   New onset a-fib (HCC)   MEDICATIONS:    Scheduled Meds:  sodium chloride  250 mL Intravenous Once   acetaminophen  650 mg Oral Once   Chlorhexidine Gluconate Cloth  6 each Topical Daily   rivaroxaban  20 mg Oral Q supper   Continuous Infusions:  sodium chloride 20 mL/hr at 06/30/21 0944   sodium chloride Stopped (06/29/21 1326)    ceFAZolin (ANCEF) IV Stopped (06/30/21 0630)   clindamycin (CLEOCIN) IV Stopped (06/30/21 0758)   dextrose 10 % with additives Pediatric IV fluid 50 mL/hr at 06/30/21 0944   PRN Meds:.sodium chloride, sodium chloride, acetaminophen **OR** acetaminophen, heparin lock flush, heparin lock flush, ondansetron **OR** ondansetron (ZOFRAN) IV, sodium chloride flush  HPI:    [name] [name] is a 71 y.o. female with a past medical history of short gut syndrome on chronic TPN via left tunneled subclavian catheter, chronic systolic heart failure, CKD stage III with baseline creatinine 1.2, hypercoagulable state on anticoagulation, and prior central line infections who was admitted  for left upper extremity cellulitis and found to have bacteremia with strep pyogenous.  Patient presented to the emergency department on 06/29/2021 with worsening left upper extremity erythema and swelling.  She was also experiencing fevers at home.  She states her symptoms started on Thursday evening after  she fell asleep with a heating pad on her left arm.  The erythema continued to spread which, along with her fevers, prompted her to present to [place] emergency department.  Incidentally, she was also found to have a right proximal humerus fracture.  She reports no recent fall or trauma.  The only thing she reports was she recently fell backwards trying to sit down on the toilet but did not injure her right arm.  She was admitted to [facility] for IV antibiotics in the setting of sepsis.  She was started on broad-spectrum antibiotics.  Her blood cultures were obtained and have grown strep pyogenes in both blood culture sets.  She also underwent CT scan of the left upper extremity which was notable for soft tissue swelling in the left upper extremity, left breast consistent with focal cellulitis.  There is no abscess noted or necrotizing infection.  This morning, she reports that her swelling and erythema has improved.  She was febrile yesterday morning up to 100.9 F with leukocytosis, however, she has been afebrile since that time.  When her blood cultures resulted, she was switched from ceftriaxone and vancomycin to cefazolin and clindamycin.  She reports a penicillin allergy as a child that was described as itching and swelling.  She has not had penicillin since that time.   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   Fever 10/29/2020   Fracture of left clavicle    History of blood transfusion 2013   anemia   Hx of cardiovascular stress test    Myoview (9/15):  inf-apical scar; no ischemia; EF 47% - low risk    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 12/12/2011   Osteoporosis    Pancytopenia 10/07/2011   Pathologic fracture of neck of femur ([place])    Personal history of colonic polyps    Renal insufficiency    hx of yrs ago   Serratia marcescens infection - bactermia assoc w/ PICC 01/18/2015   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HCC)    Splenomegaly    By ultrasound   Stroke ([place]) 07/2020   Thrombophilia (HCC)    Vitamin D deficiency     Social History   Tobacco Use   Smoking status: Former    Types: Cigarettes    Quit date: 05/12/2004    Years since quitting: 17.1   Smokeless tobacco: Never  Vaping Use   Vaping Use: Never used  Substance Use Topics   Alcohol use:  No   Drug use: No    Family History  Problem Relation Age of Onset   Diabetes Mother    Hypertension Mother    AAA (abdominal aortic aneurysm) Mother    Colon cancer Neg Hx    Stomach cancer Neg Hx     Allergies  Allergen Reactions   Penicillins Itching, Swelling and Other (See Comments)    Reaction:  Facial swelling Has patient had a PCN reaction causing immediate rash, facial/tongue/throat swelling, SOB or lightheadedness with hypotension: Yes Has patient had a PCN reaction causing severe rash involving mucus membranes or skin necrosis: No Has patient had a PCN reaction that required hospitalization No Has patient had a PCN reaction occurring within the last 10 years: No If all of the above answers are "NO", then may proceed with Cephalosporin use.    Review of Systems  Constitutional:  Positive for fever and malaise/fatigue.  Respiratory: Negative.    Cardiovascular: Negative.   Gastrointestinal:  Positive for diarrhea.  Genitourinary: Negative.    Musculoskeletal: Negative.   Skin:  Positive for rash.  All other systems reviewed and are negative.  OBJECTIVE:   Blood pressure (!) 102/49, pulse (!) 56, temperature 97.7 F (36.5 C), temperature source Oral, resp. rate 17, height 5\' 8"  (1.727 m), weight 69.4 kg, last menstrual period 02/26/2014, SpO2 100 %. Body mass index is 23.26 kg/m.  Physical Exam Constitutional:      General: She is not in acute distress.    Appearance: Normal appearance.  HENT:     Head: Normocephalic and atraumatic.  Eyes:     Extraocular Movements: Extraocular movements intact.     Conjunctiva/sclera: Conjunctivae normal.  Neck:     Comments: PIV in place.  Cardiovascular:     Comments: Left sided tunneled central line.  No warmth, erythema, TTP. Pulmonary:     Effort: Pulmonary effort is normal. No respiratory distress.  Abdominal:     General: There is no distension.     Palpations: Abdomen is soft.     Tenderness: There is no abdominal tenderness.  Musculoskeletal:        General: Swelling and tenderness present.     Cervical back: Normal range of motion and neck supple.     Comments: Left arm with warmth and erythema demarcated with pen marking.  No spread beyond the markings.  Left breast with faint erythema.  No spread beyond pen markings.   Skin:    General: Skin is warm and dry.     Findings: Erythema present.  Neurological:     General: No focal deficit present.     Mental Status: She is alert and oriented to person, place, and time.  Psychiatric:        Mood and Affect: Mood normal.        Behavior: Behavior normal.     Lab Results: Lab Results  Component Value Date   WBC 11.1 (H) 06/30/2021   HGB 9.0 (L) 06/30/2021   HCT 28.5 (L) 06/30/2021   MCV 91.3 06/30/2021   PLT 72 (L) 06/30/2021    Lab Results  Component Value Date   NA 136 06/30/2021   K 3.9 06/30/2021   CO2 23 06/30/2021   GLUCOSE 103 (H) 06/30/2021   BUN 24 (H) 06/30/2021   CREATININE 1.44 (H) 06/30/2021    CALCIUM 7.5 (L) 06/30/2021   GFRNONAA 39 (L) 06/30/2021   GFRAA 59 (L) 12/08/2018    Lab Results  Component Value Date   ALT  16 06/30/2021   AST 28 06/30/2021   ALKPHOS 103 06/30/2021   BILITOT 0.8 06/30/2021       Component Value Date/Time   CRP 3.6 10/10/2020 1051       Component Value Date/Time   ESRSEDRATE 95 (H) 12/09/2011 1730    I have reviewed the micro and lab results in Epic.  Imaging: DG Shoulder Right  Result Date: 06/29/2021 CLINICAL DATA:  Right shoulder pain. EXAM: RIGHT SHOULDER - 2+ VIEW COMPARISON:  None. FINDINGS: Limited two views study shows deformity of the right humeral neck compatible with comminuted fracture. Fracture line not well demonstrated due to positioning and demineralization. IMPRESSION: Comminuted right humeral neck fracture, poorly demonstrated due to positioning and demineralization. Electronically Signed   By: Misty Stanley M.D.   On: 06/29/2021 12:25   CT SHOULDER RIGHT WO CONTRAST  Result Date: 06/29/2021 CLINICAL DATA:  Shoulder trauma. Earlier same day shoulder radiograph demonstrates right humeral neck fracture. EXAM: CT OF THE UPPER RIGHT EXTREMITY WITHOUT CONTRAST TECHNIQUE: Multidetector CT imaging of the upper right extremity was performed according to the standard protocol. RADIATION DOSE REDUCTION: This exam was performed according to the departmental dose-optimization program which includes automated exposure control, adjustment of the mA and/or kV according to patient size and/or use of iterative reconstruction technique. COMPARISON:  CT chest 05/15/2021, 11/08/2020; shoulder radiograph 01/10/2020, 10/17/2019 FINDINGS: Bones/Joint/Cartilage Healing oblique fracture of the surgical neck and proximal shaft of the right humerus, not significantly changed compared to prior CT chest 11/08/2020. No acute fracture of the right humerus, scapula, clavicle, or visualized right ribs. No dislocation of the right humeral head. Very mild  osteoarthritic changes of the acromioclavicular joint. No joint effusion. Ligaments Suboptimally assessed by CT. Muscles and Tendons No acute abnormality. Soft tissues Unremarkable. No subcutaneous fat stranding or fluid collection in the visualized right upper arm or at the right shoulder. Interval development of a 0.8 cm solid nodule in the right upper lobe at the apex as well as a 0.6 cm solid nodule at the location of the previously described clustered nodular opacities on recent CT chest 05/15/2021 (series 7, images 46, 63). Trace right pleural fluid with adjacent subsegmental atelectasis. Partially visualized left brachiocephalic/SVC stent with central venous catheter. IMPRESSION: 1. Healing oblique fracture of the surgical neck and proximal shaft of the right humerus, not significantly changed compared to prior CT chest 11/08/2020. No acute osseous abnormality. 2. Unremarkable soft tissues of the visualized right upper arm and right shoulder. 3. Interval development of 2 solid pulmonary nodules in the right upper lobe since recent CT chest 05/15/2021, favored to be infectious or inflammatory given waxing and waning nodules on prior cross-sectional imaging. Non-contrast chest CT at 3-6 months is recommended. If the nodules are stable at time of repeat CT, then future CT at 18-24 months (from today's scan) is considered optional for low-risk patients, but is recommended for high-risk patients. This recommendation follows the consensus statement: Guidelines for Management of Incidental Pulmonary Nodules Detected on CT Images: From the Fleischner Society 2017; Radiology 2017; 284:228-243. Electronically Signed   By: Ileana Roup M.D.   On: 06/29/2021 18:09   DG Chest Port 1 View  Result Date: 06/29/2021 CLINICAL DATA:  Feeling bad. EXAM: PORTABLE CHEST 1 VIEW COMPARISON:  06/11/2021 FINDINGS: Or 959 hours. The cardio pericardial silhouette is enlarged. Interstitial markings are diffusely coarsened with  chronic features. Minimal atelectasis noted both lung bases. Possible tiny effusions. Left-sided central line remains in place. Bones are diffusely demineralized. Telemetry leads  overlie the chest. IMPRESSION: Low volume film with bibasilar atelectasis. Possible tiny bilateral pleural effusions. Electronically Signed   By: Misty Stanley M.D.   On: 06/29/2021 10:05   CT Extrem Up Entire Arm L WO/CM  Result Date: 06/29/2021 CLINICAL DATA:  Left upper extremity pain and swelling, initial encounter EXAM: CT OF THE UPPER LEFT EXTREMITY WITHOUT CONTRAST TECHNIQUE: Multidetector CT imaging of the upper left extremity was performed according to the standard protocol. RADIATION DOSE REDUCTION: This exam was performed according to the departmental dose-optimization program which includes automated exposure control, adjustment of the mA and/or kV according to patient size and/or use of iterative reconstruction technique. COMPARISON:  None. FINDINGS: Bones/Joint/Cartilage No acute fracture or dislocation is noted. Postsurgical changes in the proximal left femur are noted included on the distal aspect of the upper extremity. Underlying bony thorax shows no rib fractures. Ligaments Suboptimally assessed by CT. Muscles and Tendons Musculature of the left upper extremity appears within normal limits. No focal fluid collection is identified. Soft tissues Very mild edematous changes are noted in the proximal forearm and distal upper arm as well as in the left breast consistent with the given clinical history. These are most prominent in the region of the olecranon in the left elbow. No focal fluid collection to suggest abscess is noted. These changes are most consistent with cellulitis. Some reactive lymph nodes are noted in the left axilla consistent with the peripheral inflammatory change. IMPRESSION: Soft tissue swelling in the left upper extremity left breast similar to that noted on physical exam consistent with focal study  lightest. No abscess is noted. Some reactive lymphadenopathy is seen in the left axilla. No bony abnormality is noted. Electronically Signed   By: Inez Catalina M.D.   On: 06/29/2021 22:14     Imaging independently reviewed in Epic.  Raynelle Highland for Infectious Disease [facility] [phone] pager 06/30/2021, 10:24 AM

## 2021-06-30 NOTE — Progress Notes (Addendum)
Pt with positive blood cx for GPC in chains.  May be a strep skin infection.

## 2021-07-01 ENCOUNTER — Inpatient Hospital Stay (HOSPITAL_COMMUNITY): Payer: Medicare Other

## 2021-07-01 ENCOUNTER — Encounter: Payer: Self-pay | Admitting: Internal Medicine

## 2021-07-01 DIAGNOSIS — K912 Postsurgical malabsorption, not elsewhere classified: Secondary | ICD-10-CM | POA: Diagnosis not present

## 2021-07-01 DIAGNOSIS — A498 Other bacterial infections of unspecified site: Secondary | ICD-10-CM

## 2021-07-01 DIAGNOSIS — L03114 Cellulitis of left upper limb: Secondary | ICD-10-CM | POA: Diagnosis not present

## 2021-07-01 DIAGNOSIS — R609 Edema, unspecified: Secondary | ICD-10-CM

## 2021-07-01 DIAGNOSIS — B950 Streptococcus, group A, as the cause of diseases classified elsewhere: Secondary | ICD-10-CM | POA: Diagnosis not present

## 2021-07-01 DIAGNOSIS — L538 Other specified erythematous conditions: Secondary | ICD-10-CM

## 2021-07-01 DIAGNOSIS — R7881 Bacteremia: Secondary | ICD-10-CM | POA: Diagnosis not present

## 2021-07-01 LAB — GASTROINTESTINAL PANEL BY PCR, STOOL (REPLACES STOOL CULTURE)
Adenovirus F40/41: NOT DETECTED
Astrovirus: NOT DETECTED
Campylobacter species: NOT DETECTED
Cryptosporidium: NOT DETECTED
Cyclospora cayetanensis: NOT DETECTED
E. coli O157: NOT DETECTED
Entamoeba histolytica: NOT DETECTED
Enteroaggregative E coli (EAEC): NOT DETECTED
Enterotoxigenic E coli (ETEC): NOT DETECTED
Giardia lamblia: NOT DETECTED
Norovirus GI/GII: NOT DETECTED
Plesimonas shigelloides: NOT DETECTED
Rotavirus A: NOT DETECTED
Salmonella species: NOT DETECTED
Sapovirus (I, II, IV, and V): NOT DETECTED
Shiga like toxin producing E coli (STEC): DETECTED — AB
Shigella/Enteroinvasive E coli (EIEC): NOT DETECTED
Vibrio cholerae: NOT DETECTED
Vibrio species: NOT DETECTED
Yersinia enterocolitica: NOT DETECTED

## 2021-07-01 LAB — MAGNESIUM: Magnesium: 1.7 mg/dL (ref 1.7–2.4)

## 2021-07-01 LAB — BASIC METABOLIC PANEL
Anion gap: 6 (ref 5–15)
BUN: 20 mg/dL (ref 8–23)
CO2: 18 mmol/L — ABNORMAL LOW (ref 22–32)
Calcium: 7.4 mg/dL — ABNORMAL LOW (ref 8.9–10.3)
Chloride: 107 mmol/L (ref 98–111)
Creatinine, Ser: 1.45 mg/dL — ABNORMAL HIGH (ref 0.44–1.00)
GFR, Estimated: 39 mL/min — ABNORMAL LOW (ref >60)
Glucose, Bld: 86 mg/dL (ref 70–99)
Potassium: 3.1 mmol/L — ABNORMAL LOW (ref 3.5–5.1)
Sodium: 131 mmol/L — ABNORMAL LOW (ref 135–145)

## 2021-07-01 LAB — CBC WITH DIFFERENTIAL/PLATELET
Abs Immature Granulocytes: 0.03 10*3/uL (ref 0.00–0.07)
Basophils Absolute: 0 10*3/uL (ref 0.0–0.1)
Basophils Relative: 0 %
Eosinophils Absolute: 0.3 10*3/uL (ref 0.0–0.5)
Eosinophils Relative: 6 %
HCT: 28.5 % — ABNORMAL LOW (ref 36.0–46.0)
Hemoglobin: 8.8 g/dL — ABNORMAL LOW (ref 12.0–15.0)
Immature Granulocytes: 1 %
Lymphocytes Relative: 20 %
Lymphs Abs: 1.2 10*3/uL (ref 0.7–4.0)
MCH: 29.5 pg (ref 26.0–34.0)
MCHC: 30.9 g/dL (ref 30.0–36.0)
MCV: 95.6 fL (ref 80.0–100.0)
Monocytes Absolute: 0.6 10*3/uL (ref 0.1–1.0)
Monocytes Relative: 10 %
Neutro Abs: 3.9 10*3/uL (ref 1.7–7.7)
Neutrophils Relative %: 63 %
Platelets: 63 10*3/uL — ABNORMAL LOW (ref 150–400)
RBC: 2.98 MIL/uL — ABNORMAL LOW (ref 3.87–5.11)
RDW: 16.2 % — ABNORMAL HIGH (ref 11.5–15.5)
WBC: 6.1 10*3/uL (ref 4.0–10.5)
nRBC: 0 % (ref 0.0–0.2)

## 2021-07-01 LAB — PHOSPHORUS: Phosphorus: 3.1 mg/dL (ref 2.5–4.6)

## 2021-07-01 MED ORDER — POTASSIUM CHLORIDE 20 MEQ PO PACK
40.0000 meq | PACK | Freq: Once | ORAL | Status: AC
  Administered 2021-07-01: 40 meq via ORAL
  Filled 2021-07-01: qty 2

## 2021-07-01 MED ORDER — PENICILLIN G POTASSIUM 20000000 UNITS IJ SOLR
8.0000 10*6.[IU] | Freq: Two times a day (BID) | INTRAVENOUS | Status: DC
  Administered 2021-07-01 – 2021-07-02 (×4): 8 10*6.[IU] via INTRAVENOUS
  Filled 2021-07-01 (×5): qty 8

## 2021-07-01 MED ORDER — MAGNESIUM SULFATE 2 GM/50ML IV SOLN
2.0000 g | Freq: Once | INTRAVENOUS | Status: AC
  Administered 2021-07-01: 2 g via INTRAVENOUS
  Filled 2021-07-01: qty 50

## 2021-07-01 NOTE — Assessment & Plan Note (Signed)
Patient has an history of chronic diarrhea since starting TPN.  No abdominal pain or leukocytosis.  No other concerning features.  GI pathogen panel came back positive for Shiga-like toxin producing E. coli.  No bloody diarrhea. -ID is on board-will let them decide if they want to treat

## 2021-07-01 NOTE — Assessment & Plan Note (Signed)
Seems improving. -Monitor sodium

## 2021-07-01 NOTE — Progress Notes (Signed)
Left upper extremity venous duplex has been completed. Preliminary results can be found in CV Proc through chart review.   07/01/21 8:47 AM [name] [name] RVT

## 2021-07-01 NOTE — Assessment & Plan Note (Signed)
Patient has an history of CKD stage IIIa.  Creatinine improved and now stable around her baseline. -Monitor renal function -Avoid nephrotoxins

## 2021-07-01 NOTE — Progress Notes (Signed)
The patient is refusing the left lower extremity venous duplex. She states that the swelling in her legs tends to fluctuate depending on her level of activity during the day. Due to this daily fluctuation, the patient feels that the swelling is not caused by a blood clot.  The patient's nurse, Roselyn Reef, was present during this discussion.  07/01/21 8:48 AM Carlos Levering RVT

## 2021-07-01 NOTE — Progress Notes (Addendum)
Progress Note   Patient: [name] [name] CWU:[id number] DOB: June 06, 1950 DOA: 06/29/2021     2 DOS: the patient was seen and examined on 07/01/2021   Brief hospital course: Taken from prior notes.  71 year old female with a history of short gut syndrome for the last 15 years on chronic TPN via a left tunneled subclavian catheter, history of hypercoagulable state on Xarelto, reflux, chronic systolic heart failure EF of 35%, CKD stage IIIa with baseline creatinine 1.2 presents to the ER today with a 2-day history of worsening disorientation, erythema of the left arm, forearm and left breast. Found to have strep pyogenes bacteremia secondary to cellulitis of left arm and breast.  Concern of infected left-sided central line but patient and family refused to remove it due to difficult access.  IR to change central line over wire today. Patient has an history of chronic diarrhea since on TPN, GI pathogen with Shiga-like toxin producing E. coli, no concerning features.  ID is on board-recommending switching antibiotics to penicillin at this time. I will let ID decide regarding GI pathogen results. Patient will need 7 to 10 days of IV antibiotics.   Assessment and Plan: * Infection due to Streptococcus pyogenes Patient met sepsis criteria on presentation and received appropriate broad-spectrum antibiotics.  Blood cultures with strep pyogenes. Antibiotics narrowed down to cefazolin and ID further recommend switching to penicillin.  G was noted but it was found to be low risk. Concern of left central line in subclavian infection, patient and family refuse to remove that line due to difficult access in the past. She will need a change of catheter over wire today with IR. Repeat blood cultures negative so far. -ID is on board-switch antibiotic to penicillin   Left arm cellulitis Seems improving, most likely the cause of bacteremia.  Cellulitis of left breast Improving. See above note under  sepsis  STEC (Shiga toxin-producing Escherichia coli) Patient has an history of chronic diarrhea since starting TPN.  No abdominal pain or leukocytosis.  No other concerning features.  GI pathogen panel came back positive for Shiga-like toxin producing E. coli.  No bloody diarrhea. -ID is on board-will let them decide if they want to treat  Acquired short bowel syndrome- (present on admission) Chronic.  Patient on TPN 4-5 nights a week.  She is on it via a left tunneled subclavian catheter.  This was placed back in September 2022 with great difficulty.  Patient's been on TPN for the last 15 years due to bowel resection. Patient still does eat by mouth but does have chronic diarrhea because of her short gut. -TPN can be resumed after replacing central catheter. -Patient will need IV antibiotics as short gut syndrome will interfere with antibiotic absorption  GERD (gastroesophageal reflux disease)- (present on admission) -Continue PPI  AKI (acute kidney injury) ([place]) Patient has an history of CKD stage IIIa.  Creatinine improved and now stable around her baseline. -Monitor renal function -Avoid nephrotoxins  Hyponatremia- (present on admission) Seems improving. -Monitor sodium  Chronic systolic heart failure (HCC) - LVEF 35-40%- (present on admission) Patient initially received IV fluid per sepsis protocol. Seems euvolemic. -Continue to monitor -Keep holding home Lasix and spironolactone  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[place])- (present on admission) Patient is on Xarelto at home. -Continue Xarelto  Humerus fracture - right- (present on admission) Orthopedic was consulted and they are recommending conservative management as it seems chronic. -Outpatient orthopedic follow-up  New onset a-fib ([place]) Could be due to sepsis/cellulitis. Rate controlled at this point.  On xarelto. Check echo. Check TSH.  Sepsis ([place])- (present on admission) Admit to stepdown unit.  Patient is received 30  cc/kg of IV fluids.  Systolic blood pressure around 95.  If this continues to be low with maps less than 65, she will need to start peripheral vasopressors.  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  She currently has a tunneled left subclavian but given her cellulitis of her left breast and left arm, this could be infected.  She has a right EJ peripheral IV.  That is her only source of access at this time.  We possibly could put a right femoral central line if needed but this would be a last resort.  I suspect her sepsis is due to her cellulitis of her left breast and left arm.  The source of the cellulitis however could also be from her central catheter.  Cannot rule out abscess in her left upper arm/forearm as it swollen.  Order CT noncontrasted to better evaluate this.  We will also need left upper extremity ultrasound to rule out DVT.  Patient has been on Xarelto.      Chronic renal failure, stage 3a ([place])- (present on admission) Stable.  Hypokalemia Potassium of 3.1 with magnesium of 1.7. -Replace potassium and magnesium -Continue to monitor   Subjective: Patient thinks that her left arm and breast is improving.  Continues to have loose bowel movements, per patient she is having it for years and no recent change.  Denies any abdominal pain.  No nausea or vomiting.  Husband at bedside.  Physical Exam: Vitals:   07/01/21 1000 07/01/21 1100 07/01/21 1200 07/01/21 1300  BP: (!) 117/56 121/62 (!) 112/51 (!) 121/59  Pulse: (!) 58 68 60 60  Resp: $Remo'15 14 19 17  'emacw$ Temp:   (!) 97.5 F (36.4 C)   TempSrc:   Oral   SpO2: 100% 100% 100% 100%  Weight:      Height:       General.     In no acute distress. Pulmonary.  Lungs clear bilaterally, normal respiratory effort. CV.  Regular rate and rhythm, no JVD, rub or murmur. Abdomen.  Soft, nontender, nondistended, BS positive. CNS.  Alert and oriented .  No focal neurologic deficit. Extremities.   Mild erythema and edema of left upper extremity, no cyanosis, pulses intact and symmetrical. Psychiatry.  Judgment and insight appears normal.  Data Reviewed: I reviewed other notes and laboratory results.  Family Communication: Discussed with husband at bedside  Disposition: Status is: Inpatient Remains inpatient appropriate because: Severity of illness    Planned Discharge Destination: Home with Home Health  DVT prophylaxis.  Xarelto  Time spent: 50 minutes  This record has been created using Systems analyst. Errors have been sought and corrected,but may not always be located. Such creation errors do not reflect on the standard of care.  Author: Lorella Nimrod, MD 07/01/2021 1:56 PM  For on call review [email or web address].

## 2021-07-01 NOTE — Hospital Course (Addendum)
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  ID and IR consulted repeat blood cultures remain negative from 2/19, IR consulted to change central line over wire.Patient has an history of chronic diarrhea since on TPN, GI pathogen with Shiga-like toxin producing E. coli, no concerning features.  ID planning for Ancef upon discharge, Patient's PICC line/port was exchanged by IR over the guidewire 2/21. At this time patient is medically stable for discharge.  Patient to resume TPN at home

## 2021-07-01 NOTE — Assessment & Plan Note (Signed)
Patient met sepsis criteria on presentation and received appropriate broad-spectrum antibiotics.  Blood cultures with group A strep pyogenes. Antibiotics narrowed down to cefazolin and ID further recommend switching to penicillin.  G was noted but it was found to be low risk. Concern of left central line in subclavian infection, patient and family refuse to remove that line due to difficult access in the past. She will need a change of catheter over wire today with IR. Repeat blood cultures negative so far. -ID is on board-switch antibiotic to penicillin

## 2021-07-01 NOTE — Assessment & Plan Note (Signed)
Potassium of 3.1 with magnesium of 1.7. -Replace potassium and magnesium -Continue to monitor

## 2021-07-01 NOTE — Assessment & Plan Note (Addendum)
Group A strep bacteremia from cellulitis, left arm cellulitis/left breast cellulitis. Penicillin allergy-remote history of remote allergy buthas tolerated Augmentin: Sepsis POA, sepsis physiology resolved.  ID following, repeat blood cultures 2/19 no growth to date.  Now on IV penicillin.Concern of left central line in subclavian infection, patient and family refuse to remove that line due to difficult access in the past.  IR consulted to exchange over the wire-completed 2/19.  Plan is to discharge patient home for 7 days-of IV Ancef

## 2021-07-01 NOTE — Plan of Care (Signed)
°  Problem: Education: Goal: Knowledge of General Education information will improve Description: Including pain rating scale, medication(s)/side effects and non-pharmacologic comfort measures Outcome: Progressing   Problem: Health Behavior/Discharge Planning: Goal: Ability to manage health-related needs will improve Outcome: Progressing   Problem: Clinical Measurements: Goal: Ability to maintain clinical measurements within normal limits will improve Outcome: Progressing   Problem: Clinical Measurements: Goal: Respiratory complications will improve Outcome: Progressing   Problem: Clinical Measurements: Goal: Cardiovascular complication will be avoided Outcome: Progressing   Problem: Activity: Goal: Risk for activity intolerance will decrease Outcome: Progressing   Problem: Nutrition: Goal: Adequate nutrition will be maintained Outcome: Progressing

## 2021-07-01 NOTE — Progress Notes (Signed)
[facility]   Reason for visit: Follow up on cellulitis  Interval History: WBC 6.1, remains afebrile since initial fever of 100.9.   Day 3 total antibiotics  Physical Exam: Constitutional:  Vitals:   07/01/21 0900 07/01/21 1000  BP: 124/62 (!) 117/56  Pulse: 60 (!) 58  Resp: 20 15  Temp:    SpO2: 99% 100%   patient appears in NAD Respiratory: Normal respiratory effort; CTA B Cardiovascular: RRR GI: soft, nt, nd Skin: left arm with erythema, warmth, some regression from initial markings.    Review of Systems: Constitutional: negative for fevers and chills Gastrointestinal: negative for nausea and diarrhea  Lab Results  Component Value Date   WBC 6.1 07/01/2021   HGB 8.8 (L) 07/01/2021   HCT 28.5 (L) 07/01/2021   MCV 95.6 07/01/2021   PLT 63 (L) 07/01/2021    Lab Results  Component Value Date   CREATININE 1.45 (H) 07/01/2021   BUN 20 07/01/2021   NA 131 (L) 07/01/2021   K 3.1 (L) 07/01/2021   CL 107 07/01/2021   CO2 18 (L) 07/01/2021    Lab Results  Component Value Date   ALT 16 06/30/2021   AST 28 06/30/2021   ALKPHOS 103 06/30/2021     Microbiology: Recent Results (from the past 240 hour(s))  Culture, blood (Routine x 2)     Status: Abnormal (Preliminary result)   Collection Time: 06/29/21  9:51 AM   Specimen: BLOOD RIGHT HAND  Result Value Ref Range Status   Specimen Description   Final    BLOOD RIGHT HAND BLOOD Performed at [facility], [address] [place], [place] [address]    Special Requests   Final    Blood Culture adequate volume BOTTLES DRAWN AEROBIC AND ANAEROBIC Performed at [facility], [address] [place], [place] [address]    Culture  Setup Time   Final    GRAM POSITIVE COCCI IN CHAINS IN BOTH AEROBIC AND ANAEROBIC BOTTLES CRITICAL RESULT CALLED TO, READ BACK BY AND VERIFIED WITH: L POINDEXTER,PHARMD@[id number]  06/30/21 [place]    Culture (A)  Final    GROUP A STREP (S.PYOGENES)  ISOLATED SUSCEPTIBILITIES TO FOLLOW Performed at [facility], [address], [place], [place] [address]    Report Status PENDING  Incomplete  Resp Panel by RT-PCR (Flu A&B, Covid) Nasopharyngeal Swab     Status: None   Collection Time: 06/29/21  9:51 AM   Specimen: Nasopharyngeal Swab; Nasopharyngeal(NP) swabs in vial transport medium  Result Value Ref Range Status   SARS Coronavirus 2 by RT PCR NEGATIVE NEGATIVE Final    Comment: (NOTE) SARS-CoV-2 target nucleic acids are NOT DETECTED.  The SARS-CoV-2 RNA is generally detectable in upper respiratory specimens during the acute phase of infection. The lowest concentration of SARS-CoV-2 viral copies this assay can detect is 138 copies/mL. A negative result does not preclude SARS-Cov-2 infection and should not be used as the sole basis for treatment or other patient management decisions. A negative result may occur with  improper specimen collection/handling, submission of specimen other than nasopharyngeal swab, presence of viral mutation(s) within the areas targeted by this assay, and inadequate number of viral copies(<138 copies/mL). A negative result must be combined with clinical observations, patient history, and epidemiological information. The expected result is Negative.  Fact Sheet for Patients:  EntrepreneurPulse.com.au  Fact Sheet for Healthcare Providers:  IncredibleEmployment.be  This test is no t yet approved or  cleared by the [place] and  has been authorized for detection and/or diagnosis of SARS-CoV-2 by FDA under an Emergency Use Authorization (EUA). This EUA will remain  in effect (meaning this test can be used) for the duration of the COVID-19 declaration under Section 564(b)(1) of the Act, 21 U.S.C.section 360bbb-3(b)(1), unless the authorization is terminated  or revoked sooner.       Influenza A by PCR NEGATIVE NEGATIVE Final   Influenza B by PCR  NEGATIVE NEGATIVE Final    Comment: (NOTE) The Xpert Xpress SARS-CoV-2/FLU/RSV plus assay is intended as an aid in the diagnosis of influenza from Nasopharyngeal swab specimens and should not be used as a sole basis for treatment. Nasal washings and aspirates are unacceptable for Xpert Xpress SARS-CoV-2/FLU/RSV testing.  Fact Sheet for Patients: EntrepreneurPulse.com.au  Fact Sheet for Healthcare Providers: IncredibleEmployment.be  This test is not yet approved or cleared by the [place] FDA and has been authorized for detection and/or diagnosis of SARS-CoV-2 by FDA under an Emergency Use Authorization (EUA). This EUA will remain in effect (meaning this test can be used) for the duration of the COVID-19 declaration under Section 564(b)(1) of the Act, 21 U.S.C. section 360bbb-3(b)(1), unless the authorization is terminated or revoked.  Performed at [facility], [address] [place], [place] [address]   Blood Culture ID Panel (Reflexed)     Status: Abnormal   Collection Time: 06/29/21  9:51 AM  Result Value Ref Range Status   Enterococcus faecalis NOT DETECTED NOT DETECTED Final   Enterococcus Faecium NOT DETECTED NOT DETECTED Final   Listeria monocytogenes NOT DETECTED NOT DETECTED Final   Staphylococcus species NOT DETECTED NOT DETECTED Final   Staphylococcus aureus (BCID) NOT DETECTED NOT DETECTED Final   Staphylococcus epidermidis NOT DETECTED NOT DETECTED Final   Staphylococcus lugdunensis NOT DETECTED NOT DETECTED Final   Streptococcus species DETECTED (A) NOT DETECTED Final    Comment: CRITICAL RESULT CALLED TO, READ BACK BY AND VERIFIED WITH: L POINDEXTER,PHARMD@0450  06/30/21    Streptococcus agalactiae NOT DETECTED NOT DETECTED Final   Streptococcus pneumoniae NOT DETECTED NOT DETECTED Final   Streptococcus pyogenes DETECTED (A) NOT DETECTED Final    Comment: CRITICAL RESULT CALLED TO, READ BACK BY AND VERIFIED  WITH: L POINDEXTER,PHARMD@0450  06/30/21    A.calcoaceticus-baumannii NOT DETECTED NOT DETECTED Final   Bacteroides fragilis NOT DETECTED NOT DETECTED Final   Enterobacterales NOT DETECTED NOT DETECTED Final   Enterobacter cloacae complex NOT DETECTED NOT DETECTED Final   Escherichia coli NOT DETECTED NOT DETECTED Final   Klebsiella aerogenes NOT DETECTED NOT DETECTED Final   Klebsiella oxytoca NOT DETECTED NOT DETECTED Final   Klebsiella pneumoniae NOT DETECTED NOT DETECTED Final   Proteus species NOT DETECTED NOT DETECTED Final   Salmonella species NOT DETECTED NOT DETECTED Final   Serratia marcescens NOT DETECTED NOT DETECTED Final   Haemophilus influenzae NOT DETECTED NOT DETECTED Final   Neisseria meningitidis NOT DETECTED NOT DETECTED Final   Pseudomonas aeruginosa NOT DETECTED NOT DETECTED Final   Stenotrophomonas maltophilia NOT DETECTED NOT DETECTED Final   Candida albicans NOT DETECTED NOT DETECTED Final   Candida auris NOT DETECTED NOT DETECTED Final   Candida glabrata NOT DETECTED NOT DETECTED Final   Candida krusei NOT DETECTED NOT DETECTED Final   Candida parapsilosis NOT DETECTED NOT DETECTED Final   Candida tropicalis NOT DETECTED NOT DETECTED Final   Cryptococcus neoformans/gattii NOT DETECTED NOT DETECTED Final    Comment: Performed at [facility],  [address] [place], [place] [address]  Culture, blood (Routine x 2)     Status: None (Preliminary result)   Collection Time: 06/29/21 12:05 PM   Specimen: Neck; Blood  Result Value Ref Range Status   Specimen Description   Final    NECK BLOOD Performed at [facility], [address] [place], [place] [address]    Special Requests   Final    Blood Culture adequate volume BOTTLES DRAWN AEROBIC AND ANAEROBIC Performed at [facility], [address] [place], [place] [address]    Culture  Setup Time   Final    GRAM POSITIVE COCCI IN CHAINS ANAEROBIC BOTTLE ONLY CRITICAL VALUE NOTED.   VALUE IS CONSISTENT WITH PREVIOUSLY REPORTED AND CALLED VALUE. Performed at [facility], [place] [address] [place], [place] [address]    Culture GRAM POSITIVE COCCI  Final   Report Status PENDING  Incomplete  MRSA Next Gen by PCR, Nasal     Status: None   Collection Time: 06/29/21  8:15 PM   Specimen: Nasal Mucosa; Nasal Swab  Result Value Ref Range Status   MRSA by PCR Next Gen NOT DETECTED NOT DETECTED Final    Comment: (NOTE) The GeneXpert MRSA Assay (FDA approved for NASAL specimens only), is one component of a comprehensive MRSA colonization surveillance program. It is not intended to diagnose MRSA infection nor to guide or monitor treatment for MRSA infections. Test performance is not FDA approved in patients less than 23 years old. Performed at [facility], [place] [address], [place], [place] [address]   C Difficile Quick Screen w PCR reflex     Status: None   Collection Time: 06/30/21  9:04 AM   Specimen: STOOL  Result Value Ref Range Status   C Diff antigen NEGATIVE NEGATIVE Final   C Diff toxin NEGATIVE NEGATIVE Final   C Diff interpretation No C. difficile detected.  Final    Comment: Performed at [facility], [place] [address], [place], [place] [address]  Culture, blood (routine x 2)     Status: None (Preliminary result)   Collection Time: 06/30/21 12:02 PM   Specimen: BLOOD  Result Value Ref Range Status   Specimen Description   Final    BLOOD RIGHT ANTECUBITAL Performed at [facility] [address], [place], [place] [address]    Special Requests   Final    BOTTLES DRAWN AEROBIC ONLY Blood Culture adequate volume Performed at [facility] [address] [place], [place] [address]    Culture   Final    NO GROWTH < 24 HOURS Performed at [facility] [address], [place], [place] [address]    Report Status PENDING  Incomplete  Culture, blood (routine x 2)     Status: None  (Preliminary result)   Collection Time: 06/30/21 12:02 PM   Specimen: BLOOD  Result Value Ref Range Status   Specimen Description   Final    BLOOD RIGHT ANTECUBITAL Performed at [facility] [address] [place], [place] [address]    Special Requests   Final    BOTTLES DRAWN AEROBIC ONLY Blood Culture adequate volume Performed at [facility] [address], [place], [place] [address]    Culture   Final    NO GROWTH < 24 HOURS Performed at [facility] [address] [place], [place] [address]  Report Status PENDING  Incomplete    Impression/Plan:  1. Left arm cellulitis - improving slowly and blood cultures with group A Strep on penicillin. On cefazolin and will transition to penicillin IV.    2.  Penicillin allergy - remote history as a child and she has tolerated Augmentin in the past so not consistent with a penicillin allergy.  Will transition to penicillin as above.   3.  Bacteremia - group A Strep from the cellulitis.  On IV antibiotics and will continue.  I agree that it is ideal she gets her line changed over a wire.  Repeat blood cultures remain ngtd from yesterday.    4. Short gut syndrome - she does take some po intake but may not absorb antibiotics sufficiently for treatment.  Therefore she will continue with IV antibiotics at discharge, likely another 7-10 days.

## 2021-07-02 ENCOUNTER — Inpatient Hospital Stay (HOSPITAL_COMMUNITY): Payer: Medicare Other

## 2021-07-02 ENCOUNTER — Encounter (HOSPITAL_COMMUNITY): Payer: Medicare Other

## 2021-07-02 DIAGNOSIS — L03114 Cellulitis of left upper limb: Secondary | ICD-10-CM | POA: Diagnosis not present

## 2021-07-02 DIAGNOSIS — R7881 Bacteremia: Secondary | ICD-10-CM | POA: Diagnosis not present

## 2021-07-02 DIAGNOSIS — B950 Streptococcus, group A, as the cause of diseases classified elsewhere: Secondary | ICD-10-CM | POA: Diagnosis not present

## 2021-07-02 DIAGNOSIS — K912 Postsurgical malabsorption, not elsewhere classified: Secondary | ICD-10-CM | POA: Diagnosis not present

## 2021-07-02 HISTORY — PX: IR FLUORO GUIDE CV LINE LEFT: IMG2282

## 2021-07-02 LAB — CULTURE, BLOOD (ROUTINE X 2)
Special Requests: ADEQUATE
Special Requests: ADEQUATE

## 2021-07-02 MED ORDER — POTASSIUM CHLORIDE CRYS ER 20 MEQ PO TBCR
40.0000 meq | EXTENDED_RELEASE_TABLET | Freq: Once | ORAL | Status: AC
  Administered 2021-07-02: 40 meq via ORAL
  Filled 2021-07-02: qty 2

## 2021-07-02 MED ORDER — HEPARIN SOD (PORK) LOCK FLUSH 100 UNIT/ML IV SOLN
INTRAVENOUS | Status: DC | PRN
  Administered 2021-07-02: 500 [IU]

## 2021-07-02 MED ORDER — LIDOCAINE-EPINEPHRINE 1 %-1:100000 IJ SOLN
INTRAMUSCULAR | Status: AC
  Filled 2021-07-02: qty 1

## 2021-07-02 MED ORDER — HEPARIN SOD (PORK) LOCK FLUSH 100 UNIT/ML IV SOLN
INTRAVENOUS | Status: AC
  Filled 2021-07-02: qty 5

## 2021-07-02 MED ORDER — SODIUM CHLORIDE 0.9 % IV BOLUS
250.0000 mL | Freq: Once | INTRAVENOUS | Status: AC
  Administered 2021-07-02: 250 mL via INTRAVENOUS

## 2021-07-02 MED ORDER — LIDOCAINE-EPINEPHRINE 1 %-1:100000 IJ SOLN
INTRAMUSCULAR | Status: DC | PRN
  Administered 2021-07-02: 10 mL

## 2021-07-02 NOTE — Assessment & Plan Note (Signed)
Orthopedic input appreciated continue conservative management and outpatient follow-up

## 2021-07-02 NOTE — Assessment & Plan Note (Addendum)
Multiple small bowel resection, right hemicolectomy and maintained on TPN for many years.   On TPN 4-5 nights/wk, w/ left tunneled subclavian cathete (September 2022 with great difficulty). Continue p.o. diet, continue TPN at home.

## 2021-07-02 NOTE — Assessment & Plan Note (Addendum)
Repleted.  Patient will continue with her TPN which should help her electrolyte issues, she gets routine labs done from outpatient while she is on TPN

## 2021-07-02 NOTE — Progress Notes (Signed)
Initial Nutrition Assessment  DOCUMENTATION CODES:   Not applicable  INTERVENTION:  - TPN per Pharmacist once PICC replaced.    NUTRITION DIAGNOSIS:   Inadequate oral intake related to altered GI function, chronic illness as evidenced by per patient/family report.  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 She presented to the ED with 2 day hx of worsening disorientation, erythema of L arm, L forearm, and L breast. She was found to have strep bacteremia 2/2 cellulitis of L arm and L breast and concern for infected L-sided central line. She has chronic diarrhea; GI pathogen report showed shiga-like toxin producing e.coli with need for 7-10 days IV abx.  Able to talk with Pharmacist and RN prior to visit to patient's room. PICC replacement to occur later this afternoon vs tomorrow.   Patient is on a Regular diet. Several snacks on bedside table that patient has been nibbling on since admission.   Patient has been on long term home TPN. She infuses TPN x8 hours/night (2300-0700) from Sunday night-Thursday morning.   At home she eats small portions of things throughout the day such as eggs, grits, cereal, peanut butter crackers, crackers with cheese, cottage cheese, etc.   IV multivitamin is infused with TPN and she also has been taking vitamin A, vitamin E, and vitamin D supplementation since 2006.   She recounts that she had previously lost a significant amount of weight  and was infusing TPN x5 nights/week during that time until she regained weight then went back to 4 night/week. Over the summer she had a stroke and was subsequently participating in rehab but then got COVID. She is hopeful to get back to rehab to continue to work on regaining strength.   Weight today is 155 lb which is up from weights as far back as 12/07/20.  She works with a RD through [facility] ([facility]) and talks with her once/month about her labs and about changes that are needed based on monthly labs.    Labs reviewed; Na: 131 mmol/l, K: 3.1 mmol/l, creatinine: 1.45 mg/dl, Ca: 7.4 mg/dl, GFR: 39 ml/min.  Medications reviewed; 40 mEq Klor-Con x1 dose 2/21.     NUTRITION - FOCUSED PHYSICAL EXAM:  Flowsheet Row Most Recent Value  Orbital Region No depletion  Upper Arm Region No depletion  Thoracic and Lumbar Region No depletion  Buccal Region No depletion  Temple Region No depletion  Clavicle Bone Region No depletion  Clavicle and Acromion Bone Region No depletion  Scapular Bone Region No depletion  Dorsal Hand No depletion  Patellar Region Moderate depletion  Anterior Thigh Region Moderate depletion  Posterior Calf Region Moderate depletion  Edema (RD Assessment) None  Hair Reviewed  Eyes Reviewed  Mouth Reviewed  Skin Reviewed  Nails Reviewed       Diet Order:   Diet Order             Diet regular Room service appropriate? Yes; Fluid consistency: Thin  Diet effective now  EDUCATION NEEDS:   Education needs have been addressed  Skin:  Skin Assessment: Reviewed RN Assessment  Last BM:  2/21 (type 7 x2, medium amount x2)  Height:   Ht Readings from Last 1 Encounters:  06/29/21 5\' 8"  (1.727 m)    Weight:   Wt Readings from Last 1 Encounters:  07/02/21 70.3 kg     BMI:  Body mass index is 23.57 kg/m.  Estimated Nutritional Needs:  Kcal:  1755-1970 kcal Protein:  90-105 grams Fluid:  >/= 2  L/day     Jarome Matin, MS, RD, LDN Inpatient Clinical Dietitian RD pager # available in [place]  After hours/weekend pager # available in [facility]

## 2021-07-02 NOTE — Assessment & Plan Note (Signed)
Improving.  See above wound care

## 2021-07-02 NOTE — Progress Notes (Signed)
[facility]   Reason for visit: follow up on cellulitis  Interval History: remains afebrile, no new complaints.  GI pathogen panel sent and positive for STEC.  No significant new diarrhea.  Day 4 total antibiotics  Physical Exam: Constitutional:  Vitals:   07/02/21 1200 07/02/21 1300  BP: (!) 90/45 (!) 115/46  Pulse: (!) 51 68  Resp: 10 19  Temp:    SpO2: 100% 100%  She is in nad Respiratory: normal respiratory effort; CTA B Cardiovascular: RRR GI: soft Skin: left arm with decreased erythema, less warmth  Review of Systems: Constitutional: negative for fever and chills Gastrointestinal: negative for nausea  Lab Results  Component Value Date   WBC 6.1 07/01/2021   HGB 8.8 (L) 07/01/2021   HCT 28.5 (L) 07/01/2021   MCV 95.6 07/01/2021   PLT 63 (L) 07/01/2021    Lab Results  Component Value Date   CREATININE 1.45 (H) 07/01/2021   BUN 20 07/01/2021   NA 131 (L) 07/01/2021   K 3.1 (L) 07/01/2021   CL 107 07/01/2021   CO2 18 (L) 07/01/2021    Lab Results  Component Value Date   ALT 16 06/30/2021   AST 28 06/30/2021   ALKPHOS 103 06/30/2021     Microbiology: Recent Results (from the past 240 hour(s))  Culture, blood (Routine x 2)     Status: Abnormal   Collection Time: 06/29/21  9:51 AM   Specimen: BLOOD RIGHT HAND  Result Value Ref Range Status   Specimen Description   Final    BLOOD RIGHT HAND BLOOD Performed at [facility], [address] [place], [place] [address]    Special Requests   Final    Blood Culture adequate volume BOTTLES DRAWN AEROBIC AND ANAEROBIC Performed at [facility], [address] [place], [place] [address]    Culture  Setup Time   Final    GRAM POSITIVE COCCI IN CHAINS IN BOTH AEROBIC AND ANAEROBIC BOTTLES CRITICAL RESULT CALLED TO, READ BACK BY AND VERIFIED WITH: L POINDEXTER,PHARMD@[id number]  06/30/21 [facility]    Culture (A)  Final    GROUP A STREP (S.PYOGENES) ISOLATED HEALTH  DEPARTMENT NOTIFIED Performed at [facility], [place] [address] [place], [place] [address]    Report Status 07/02/2021 FINAL  Final   Organism ID, Bacteria GROUP A STREP (S.PYOGENES) ISOLATED  Final      Susceptibility   Group a strep (s.pyogenes) isolated - MIC*    PENICILLIN <=0.06 SENSITIVE Sensitive     CEFTRIAXONE <=0.12 SENSITIVE Sensitive     ERYTHROMYCIN <=0.12 SENSITIVE Sensitive     LEVOFLOXACIN 0.5 SENSITIVE Sensitive     VANCOMYCIN 0.5 SENSITIVE Sensitive     * GROUP A STREP (S.PYOGENES) ISOLATED  Resp Panel by RT-PCR (Flu A&B, Covid) Nasopharyngeal Swab     Status: None   Collection Time: 06/29/21  9:51 AM   Specimen: Nasopharyngeal Swab; Nasopharyngeal(NP) swabs in vial transport medium  Result Value Ref Range Status   SARS Coronavirus 2 by RT PCR NEGATIVE NEGATIVE Final    Comment: (NOTE) SARS-CoV-2 target nucleic acids are NOT DETECTED.  The SARS-CoV-2 RNA is generally detectable in upper respiratory specimens during the acute phase of infection. The lowest concentration of SARS-CoV-2 viral copies this assay can detect is 138 copies/mL. A negative result does not preclude SARS-Cov-2 infection and should not be used as the sole basis for treatment or other patient management decisions. A negative result  may occur with  improper specimen collection/handling, submission of specimen other than nasopharyngeal swab, presence of viral mutation(s) within the areas targeted by this assay, and inadequate number of viral copies(<138 copies/mL). A negative result must be combined with clinical observations, patient history, and epidemiological information. The expected result is Negative.  Fact Sheet for Patients:  EntrepreneurPulse.com.au  Fact Sheet for Healthcare Providers:  IncredibleEmployment.be  This test is no t yet approved or cleared by the [place] FDA and  has been authorized for detection and/or diagnosis of  SARS-CoV-2 by FDA under an Emergency Use Authorization (EUA). This EUA will remain  in effect (meaning this test can be used) for the duration of the COVID-19 declaration under Section 564(b)(1) of the Act, 21 U.S.C.section 360bbb-3(b)(1), unless the authorization is terminated  or revoked sooner.       Influenza A by PCR NEGATIVE NEGATIVE Final   Influenza B by PCR NEGATIVE NEGATIVE Final    Comment: (NOTE) The Xpert Xpress SARS-CoV-2/FLU/RSV plus assay is intended as an aid in the diagnosis of influenza from Nasopharyngeal swab specimens and should not be used as a sole basis for treatment. Nasal washings and aspirates are unacceptable for Xpert Xpress SARS-CoV-2/FLU/RSV testing.  Fact Sheet for Patients: EntrepreneurPulse.com.au  Fact Sheet for Healthcare Providers: IncredibleEmployment.be  This test is not yet approved or cleared by the [place] FDA and has been authorized for detection and/or diagnosis of SARS-CoV-2 by FDA under an Emergency Use Authorization (EUA). This EUA will remain in effect (meaning this test can be used) for the duration of the COVID-19 declaration under Section 564(b)(1) of the Act, 21 U.S.C. section 360bbb-3(b)(1), unless the authorization is terminated or revoked.  Performed at [facility], [address] [place], [place] [address]   Blood Culture ID Panel (Reflexed)     Status: Abnormal   Collection Time: 06/29/21  9:51 AM  Result Value Ref Range Status   Enterococcus faecalis NOT DETECTED NOT DETECTED Final   Enterococcus Faecium NOT DETECTED NOT DETECTED Final   Listeria monocytogenes NOT DETECTED NOT DETECTED Final   Staphylococcus species NOT DETECTED NOT DETECTED Final   Staphylococcus aureus (BCID) NOT DETECTED NOT DETECTED Final   Staphylococcus epidermidis NOT DETECTED NOT DETECTED Final   Staphylococcus lugdunensis NOT DETECTED NOT DETECTED Final   Streptococcus species DETECTED  (A) NOT DETECTED Final    Comment: CRITICAL RESULT CALLED TO, READ BACK BY AND VERIFIED WITH: L POINDEXTER,PHARMD@[id number]  06/30/21    Streptococcus agalactiae NOT DETECTED NOT DETECTED Final   Streptococcus pneumoniae NOT DETECTED NOT DETECTED Final   Streptococcus pyogenes DETECTED (A) NOT DETECTED Final    Comment: CRITICAL RESULT CALLED TO, READ BACK BY AND VERIFIED WITH: L POINDEXTER,PHARMD@[email or web address]  06/30/21    A.calcoaceticus-baumannii NOT DETECTED NOT DETECTED Final   Bacteroides fragilis NOT DETECTED NOT DETECTED Final   Enterobacterales NOT DETECTED NOT DETECTED Final   Enterobacter cloacae complex NOT DETECTED NOT DETECTED Final   Escherichia coli NOT DETECTED NOT DETECTED Final   Klebsiella aerogenes NOT DETECTED NOT DETECTED Final   Klebsiella oxytoca NOT DETECTED NOT DETECTED Final   Klebsiella pneumoniae NOT DETECTED NOT DETECTED Final   Proteus species NOT DETECTED NOT DETECTED Final   Salmonella species NOT DETECTED NOT DETECTED Final   Serratia marcescens NOT DETECTED NOT DETECTED Final   Haemophilus influenzae NOT DETECTED NOT DETECTED Final   Neisseria meningitidis NOT DETECTED NOT DETECTED Final   Pseudomonas aeruginosa NOT DETECTED NOT DETECTED Final   Stenotrophomonas maltophilia NOT DETECTED NOT  DETECTED Final   Candida albicans NOT DETECTED NOT DETECTED Final   Candida auris NOT DETECTED NOT DETECTED Final   Candida glabrata NOT DETECTED NOT DETECTED Final   Candida krusei NOT DETECTED NOT DETECTED Final   Candida parapsilosis NOT DETECTED NOT DETECTED Final   Candida tropicalis NOT DETECTED NOT DETECTED Final   Cryptococcus neoformans/gattii NOT DETECTED NOT DETECTED Final    Comment: Performed at [facility], [place] [address] [place], [place] [address]  Culture, blood (Routine x 2)     Status: Abnormal   Collection Time: 06/29/21 12:05 PM   Specimen: Neck; Blood  Result Value Ref Range Status   Specimen Description   Final    NECK BLOOD Performed at [facility], [address] [place], [place] [address]    Special Requests   Final    Blood Culture adequate volume BOTTLES DRAWN AEROBIC AND ANAEROBIC Performed at [facility], [address] [place], [place] [address]    Culture  Setup Time   Final    GRAM POSITIVE COCCI IN CHAINS ANAEROBIC BOTTLE ONLY CRITICAL VALUE NOTED.  VALUE IS CONSISTENT WITH PREVIOUSLY REPORTED AND CALLED VALUE.    Culture (A)  Final    GROUP A STREP (S.PYOGENES) ISOLATED SUSCEPTIBILITIES PERFORMED ON PREVIOUS CULTURE WITHIN THE LAST 5 DAYS. Performed at [facility], [place] [address], [place], [place] [address]    Report Status 07/02/2021 FINAL  Final  MRSA Next Gen by PCR, Nasal     Status: None   Collection Time: 06/29/21  8:15 PM   Specimen: Nasal Mucosa; Nasal Swab  Result Value Ref Range Status   MRSA by PCR Next Gen NOT DETECTED NOT DETECTED Final    Comment: (NOTE) The GeneXpert MRSA Assay (FDA approved for NASAL specimens only), is one component of a comprehensive MRSA colonization surveillance program. It is not intended to diagnose MRSA infection nor to guide or monitor treatment for MRSA infections. Test performance is not FDA approved in patients less than 21 years old. Performed at [facility], [place] [address] [place], [place] [address]   C Difficile Quick Screen w PCR reflex     Status: None   Collection Time: 06/30/21  9:04 AM   Specimen: STOOL  Result Value Ref Range Status   C Diff antigen NEGATIVE NEGATIVE Final   C Diff toxin NEGATIVE NEGATIVE Final   C Diff interpretation No C. difficile detected.  Final    Comment: Performed at [facility], [place] [address] [place], [place] [address]  Gastrointestinal Panel by PCR , Stool     Status: Abnormal   Collection Time: 06/30/21  9:04 AM   Specimen: Stool  Result Value Ref Range Status   Campylobacter species NOT DETECTED NOT DETECTED Final   Plesimonas shigelloides NOT  DETECTED NOT DETECTED Final   Salmonella species NOT DETECTED NOT DETECTED Final   Yersinia enterocolitica NOT DETECTED NOT DETECTED Final   Vibrio species NOT DETECTED NOT DETECTED Final   Vibrio cholerae NOT DETECTED NOT DETECTED Final   Enteroaggregative E coli (EAEC) NOT DETECTED NOT DETECTED Final   Enterotoxigenic E coli (ETEC) NOT DETECTED NOT DETECTED Final   Shiga like toxin producing E coli (STEC) DETECTED (A) NOT DETECTED Final    Comment: RESULT CALLED TO, READ BACK BY AND VERIFIED WITH: JAMIE [facility] 04/30/22 1243 MU    E. coli O157 NOT DETECTED NOT DETECTED Final   Shigella/Enteroinvasive E  coli (EIEC) NOT DETECTED NOT DETECTED Final   Cryptosporidium NOT DETECTED NOT DETECTED Final   Cyclospora cayetanensis NOT DETECTED NOT DETECTED Final   Entamoeba histolytica NOT DETECTED NOT DETECTED Final   Giardia lamblia NOT DETECTED NOT DETECTED Final   Adenovirus F40/41 NOT DETECTED NOT DETECTED Final   Astrovirus NOT DETECTED NOT DETECTED Final   Norovirus GI/GII NOT DETECTED NOT DETECTED Final   Rotavirus A NOT DETECTED NOT DETECTED Final   Sapovirus (I, II, IV, and V) NOT DETECTED NOT DETECTED Final    Comment: Performed at [facility], [address] [place], [place] [address]  Culture, blood (routine x 2)     Status: None (Preliminary result)   Collection Time: 06/30/21 12:02 PM   Specimen: BLOOD  Result Value Ref Range Status   Specimen Description   Final    BLOOD RIGHT ANTECUBITAL Performed at [facility], [place] [address] [place], [place] [address]    Special Requests   Final    BOTTLES DRAWN AEROBIC ONLY Blood Culture adequate volume Performed at [place] [address], [place], [place] [address]    Culture   Final    NO GROWTH 2 DAYS Performed at [facility] [address], [place], [place] [address]    Report Status PENDING  Incomplete  Culture, blood (routine x 2)     Status: None (Preliminary  result)   Collection Time: 06/30/21 12:02 PM   Specimen: BLOOD  Result Value Ref Range Status   Specimen Description   Final    BLOOD RIGHT ANTECUBITAL Performed at [place] [address], [place], [place] [address]    Special Requests   Final    BOTTLES DRAWN AEROBIC ONLY Blood Culture adequate volume Performed at [facility] [address], [place], [place] [address]    Culture   Final    NO GROWTH 2 DAYS Performed at [facility] [address] [place], [place] [address]    Report Status PENDING  Incomplete    Impression/Plan:  1. Left arm cellulitis - improved and now on I V penicillin and tolerating.  No changes and plan for her to be discharged on IV penicillin with her line already in place for TPN.  Will need a double lumen with TPN and continuous penicillin.    2.  Penicillin allergy - tolerating penicillin well and no itch, no rash or other concerns.  Has been delisted as an allergy.    3.  Bacteremia with group A Strep - repeat blood cultures remain ngtd.  On antibiotics as above.    4.  Access - has picc line and waiting on changing it over a wire per IR when possible.    Ok from ID standpoint for discharge Will plan on 7 days of IV penicillin at discharge  Diagnosis: cellulitis  Culture Result: GAS  No Active Allergies  OPAT Orders Discharge antibiotics to be given via PICC line Discharge antibiotics: IV penicillin 16 million units per 24 hours Per pharmacy protocol yes Duration: 7 days End Date: 07/09/21  [facility] Per Protocol: yes  Home health RN for IV administration and teaching; PICC line care and labs.    Labs weekly while on IV antibiotics: _x_ CBC with differential __ BMP __x CMP __ CRP __ ESR __ Vancomycin trough __ CK  __ Please pull PIC at completion of IV antibiotics __ Please leave PIC in place until doctor  has seen patient or been notified - N/A, used for TPN  Fax weekly labs  to [phone]  Clinic Follow Up Appt: As needed

## 2021-07-02 NOTE — Assessment & Plan Note (Addendum)
Noted in GI panel.  No bloody diarrhea, patient has chronic diarrhea with each meal and that has not changed, tolerating diet.

## 2021-07-02 NOTE — Progress Notes (Signed)
2D echo was attempted, but patient refused test.

## 2021-07-02 NOTE — Progress Notes (Signed)
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  ID and IR consulted repeat blood cultures remain negative from 2/19, IR consulted to change central line over wire.Patient has an history of chronic diarrhea since on TPN, GI pathogen with Shiga-like toxin producing E. coli, no concerning features.   Patient will need 7 to 10 days of IV antibiotics, trying penicillin.    Subjective:  Seen examined Aaox3, Afebrile overnight. BP 90-100, on RA Labs wbc improved, creat at 1.4, k 3.1 C/o chronic diarrhea with each meal " I am eating like a pig" No new complaints   Assessment and Plan: Streptococcal sepsis, unspecified ([place])- (present on admission) Group A strep bacteremia from cellulitis, left arm cellulitis/left breast cellulitis. Penicillin allergy-remote history of remote allergy buthas tolerated Augmentin: Sepsis POA, sepsis physiology resolved.  ID following, repeat blood cultures 2/19 no growth to date.  Now on IV penicillin.Concern of left central line in subclavian infection, patient and family refuse to remove that line due to difficult access in the past.  IR consulted to exchange over the wire.  Cellulitis of left breast- (present on admission) Improving.  See above wound care  Left arm cellulitis- (present on  admission) See #1   Acute renal failure superimposed on stage 3a chronic kidney disease ([place])- (present on admission) Creatinine improved to 1.4.  Continue IV fluids supportive care Recent Labs  Lab 06/29/21 0951 06/30/21 0250 07/01/21 0236  BUN 27* 24* 20  CREATININE 1.83* 1.44* 1.45*    New onset a-fib (HCC) In the setting of sepsis and cellulitis, rate controlled.  On Xarelto.TSH euthyroid, echo pending.  STEC (Shiga toxin-producing Escherichia coli) Noted in GI panel.  No bloody diarrhea, patient has chronic diarrhea with each meal and that has not changed, tolerating diet.  Will defer to ID.    Humerus fracture - right- (present on admission) Orthopedic input appreciated continue conservative management and outpatient follow-up   Chronic renal failure, stage 3a (HCC) Stable.  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   On TPN 4-5 nights/wk, w/ left tunneled subclavian cathete (September 2022 with great difficulty). Continue p.o. diet, continue TPN after replacing catheter.  DVT prophylaxis: SCDs Start: 06/29/21 [id number] Code Status:   Code Status: Partial Code Family Communication: plan of care discussed with patient at bedside. Disposition: Currently NOT medically  stable for discharge. Status is: Inpatient Remains inpatient appropriate  because: Ongoing management of sepsis, line exchange.  Objective: Vitals last 24 hrs: Vitals:   07/02/21 0800 07/02/21 0900 07/02/21 0904 07/02/21 1000  BP: (!) 123/57 (!) 129/114 123/88   Pulse: 62 67 61 (!) 56  Resp: 15 19 12 17   Temp:      TempSrc:      SpO2: 96% 96% (!) 83% 100%  Weight:      Height:       Weight change: 0 kg  Physical Examination: General exam: A0X3,71 older than stated age, weak appearing. HEENT:Oral mucosa moist, Ear/Nose WNL grossly, dentition normal. Respiratory system: bilaterally CLEAR BS, no use of accessory muscle Cardiovascular system: S1 & S2 +, No JVD,. Gastrointestinal system: Abdomen soft,NT,ND, BS+ Nervous System:Alert, awake, moving extremities and grossly nonfocal Extremities: LE edema NONE,distal peripheral pulses palpable.  Skin: No rashes,no icterus. MSK: Normal muscle bulk,tone, power Central catheter in place on left chest c/d/i  Medications reviewed:  Scheduled Meds:  sodium chloride  250 mL Intravenous Once   acetaminophen  650 mg Oral Once   Chlorhexidine Gluconate Cloth  6 each Topical Daily   rivaroxaban  20 mg Oral Q supper   Continuous Infusions:  sodium chloride Stopped (06/30/21 0951)   sodium chloride Stopped (06/29/21 1326)   penicillin g continuous IV infusion 8 Million Units (07/02/21 1127)   dextrose 10 % with additives Pediatric IV fluid 50 mL/hr at 07/02/21 0600     Diet Order             Diet regular Room service appropriate? Yes; Fluid consistency: Thin  Diet effective now                   Intake/Output Summary (Last 24 hours) at 07/02/2021 1134 Last data filed at 07/02/2021 0600 Gross per 24 hour  Intake 1149.9 ml  Output --  Net 1149.9 ml   Net IO Since Admission: 6,078.7 mL [07/02/21 1134]  Wt Readings from Last 3 Encounters:  07/02/21 70.3 kg  06/21/21 70.1 kg  06/19/21 71.1 kg     Unresulted Labs (From admission, onward)    None     Data Reviewed: I have personally reviewed  following labs and imaging studies CBC: Recent Labs  Lab 06/29/21 0951 06/30/21 0250 07/01/21 0236  WBC 16.3* 11.1* 6.1  NEUTROABS 14.4* 9.0* 3.9  HGB 10.2* 9.0* 8.8*  HCT 31.8* 28.5* 28.5*  MCV 89.3 91.3 95.6  PLT 64* 72* 63*   Basic Metabolic Panel: Recent Labs  Lab 06/29/21 0951 06/30/21 0250 07/01/21 0236  NA 127* 136 131*  K 3.9 3.9 3.1*  CL 98 107 107  CO2 19* 23 18*  GLUCOSE 101* 103* 86  BUN 27* 24* 20  CREATININE 1.83* 1.44* 1.45*  CALCIUM 7.7* 7.5* 7.4*  MG  --  1.8 1.7  PHOS  --   --  3.1   GFR: Estimated Creatinine Clearance: 35.9 mL/min (A) (by C-G formula based on SCr of 1.45 mg/dL (H)). Liver Function Tests: Recent Labs  Lab 06/29/21 0951 06/30/21 0250  AST 42* 28  ALT 23 16  ALKPHOS 145* 103  BILITOT 1.1 0.8  PROT 8.0 6.7  ALBUMIN 2.8* 2.2*   No results for input(s): LIPASE, AMYLASE in the last 168 hours. No results for input(s): AMMONIA in the last 168 hours. Coagulation Profile: Recent Labs  Lab 06/29/21 0951  INR 4.6*   Cardiac Enzymes: No results for input(s): CKTOTAL, CKMB, CKMBINDEX, TROPONINI  in the last 168 hours. BNP (last 3 results) No results for input(s): PROBNP in the last 8760 hours. HbA1C: No results for input(s): HGBA1C in the last 72 hours. CBG: Recent Labs  Lab 06/29/21 0952  GLUCAP 97   Lipid Profile: No results for input(s): CHOL, HDL, LDLCALC, TRIG, CHOLHDL, LDLDIRECT in the last 72 hours. Thyroid Function Tests: Recent Labs    06/30/21 0250  TSH 2.195   Anemia Panel: No results for input(s): VITAMINB12, FOLATE, FERRITIN, TIBC, IRON, RETICCTPCT in the last 72 hours. Sepsis Labs: Recent Labs  Lab 06/29/21 0951 06/29/21 1228  LATICACIDVEN 2.4* 1.6    Recent Results (from the past 240 hour(s))  Culture, blood (Routine x 2)     Status: Abnormal   Collection Time: 06/29/21  9:51 AM   Specimen: BLOOD RIGHT HAND  Result Value Ref Range Status   Specimen Description   Final    BLOOD RIGHT HAND  BLOOD Performed at [facility], [address] [place], [place] [address]    Special Requests   Final    Blood Culture adequate volume BOTTLES DRAWN AEROBIC AND ANAEROBIC Performed at [facility], [address] [place], [place] [address]    Culture  Setup Time   Final    GRAM POSITIVE COCCI IN CHAINS IN BOTH AEROBIC AND ANAEROBIC BOTTLES CRITICAL RESULT CALLED TO, READ BACK BY AND VERIFIED WITH: L POINDEXTER,PHARMD@[id number]  06/30/21 [place]    Culture (A)  Final    GROUP A STREP (S.PYOGENES) ISOLATED HEALTH DEPARTMENT NOTIFIED Performed at [facility], [place] [address] [place], [place] [address]    Report Status 07/02/2021 FINAL  Final   Organism ID, Bacteria GROUP A STREP (S.PYOGENES) ISOLATED  Final      Susceptibility   Group a strep (s.pyogenes) isolated - MIC*    PENICILLIN <=0.06 SENSITIVE Sensitive     CEFTRIAXONE <=0.12 SENSITIVE Sensitive     ERYTHROMYCIN <=0.12 SENSITIVE Sensitive     LEVOFLOXACIN 0.5 SENSITIVE Sensitive     VANCOMYCIN 0.5 SENSITIVE Sensitive     * GROUP A STREP (S.PYOGENES) ISOLATED  Resp Panel by RT-PCR (Flu A&B, Covid) Nasopharyngeal Swab     Status: None   Collection Time: 06/29/21  9:51 AM   Specimen: Nasopharyngeal Swab; Nasopharyngeal(NP) swabs in vial transport medium  Result Value Ref Range Status   SARS Coronavirus 2 by RT PCR NEGATIVE NEGATIVE Final    Comment: (NOTE) SARS-CoV-2 target nucleic acids are NOT DETECTED.  The SARS-CoV-2 RNA is generally detectable in upper respiratory specimens during the acute phase of infection. The lowest concentration of SARS-CoV-2 viral copies this assay can detect is 138 copies/mL. A negative result does not preclude SARS-Cov-2 infection and should not be used as the sole basis for treatment or other patient management decisions. A negative result may occur with  improper specimen collection/handling, submission of specimen other than nasopharyngeal swab, presence of viral  mutation(s) within the areas targeted by this assay, and inadequate number of viral copies(<138 copies/mL). A negative result must be combined with clinical observations, patient history, and epidemiological information. The expected result is Negative.  Fact Sheet for Patients:  EntrepreneurPulse.com.au  Fact Sheet for Healthcare Providers:  IncredibleEmployment.be  This test is no t yet approved or cleared by the [place] FDA and  has been authorized for detection and/or diagnosis of SARS-CoV-2 by FDA under an Emergency Use Authorization (EUA). This EUA will remain  in effect (meaning this test can be used)  for the duration of the COVID-19 declaration under Section 564(b)(1) of the Act, 21 U.S.C.section 360bbb-3(b)(1), unless the authorization is terminated  or revoked sooner.       Influenza A by PCR NEGATIVE NEGATIVE Final   Influenza B by PCR NEGATIVE NEGATIVE Final    Comment: (NOTE) The Xpert Xpress SARS-CoV-2/FLU/RSV plus assay is intended as an aid in the diagnosis of influenza from Nasopharyngeal swab specimens and should not be used as a sole basis for treatment. Nasal washings and aspirates are unacceptable for Xpert Xpress SARS-CoV-2/FLU/RSV testing.  Fact Sheet for Patients: EntrepreneurPulse.com.au  Fact Sheet for Healthcare Providers: IncredibleEmployment.be  This test is not yet approved or cleared by the [place] FDA and has been authorized for detection and/or diagnosis of SARS-CoV-2 by FDA under an Emergency Use Authorization (EUA). This EUA will remain in effect (meaning this test can be used) for the duration of the COVID-19 declaration under Section 564(b)(1) of the Act, 21 U.S.C. section 360bbb-3(b)(1), unless the authorization is terminated or revoked.  Performed at [facility], [address] [place], [place] [address]   Blood Culture ID Panel  (Reflexed)     Status: Abnormal   Collection Time: 06/29/21  9:51 AM  Result Value Ref Range Status   Enterococcus faecalis NOT DETECTED NOT DETECTED Final   Enterococcus Faecium NOT DETECTED NOT DETECTED Final   Listeria monocytogenes NOT DETECTED NOT DETECTED Final   Staphylococcus species NOT DETECTED NOT DETECTED Final   Staphylococcus aureus (BCID) NOT DETECTED NOT DETECTED Final   Staphylococcus epidermidis NOT DETECTED NOT DETECTED Final   Staphylococcus lugdunensis NOT DETECTED NOT DETECTED Final   Streptococcus species DETECTED (A) NOT DETECTED Final    Comment: CRITICAL RESULT CALLED TO, READ BACK BY AND VERIFIED WITH: L POINDEXTER,PHARMD@[id number]  06/30/21    Streptococcus agalactiae NOT DETECTED NOT DETECTED Final   Streptococcus pneumoniae NOT DETECTED NOT DETECTED Final   Streptococcus pyogenes DETECTED (A) NOT DETECTED Final    Comment: CRITICAL RESULT CALLED TO, READ BACK BY AND VERIFIED WITH: L POINDEXTER,[email or web address]@0450  06/30/21    A.calcoaceticus-baumannii NOT DETECTED NOT DETECTED Final   Bacteroides fragilis NOT DETECTED NOT DETECTED Final   Enterobacterales NOT DETECTED NOT DETECTED Final   Enterobacter cloacae complex NOT DETECTED NOT DETECTED Final   Escherichia coli NOT DETECTED NOT DETECTED Final   Klebsiella aerogenes NOT DETECTED NOT DETECTED Final   Klebsiella oxytoca NOT DETECTED NOT DETECTED Final   Klebsiella pneumoniae NOT DETECTED NOT DETECTED Final   Proteus species NOT DETECTED NOT DETECTED Final   Salmonella species NOT DETECTED NOT DETECTED Final   Serratia marcescens NOT DETECTED NOT DETECTED Final   Haemophilus influenzae NOT DETECTED NOT DETECTED Final   Neisseria meningitidis NOT DETECTED NOT DETECTED Final   Pseudomonas aeruginosa NOT DETECTED NOT DETECTED Final   Stenotrophomonas maltophilia NOT DETECTED NOT DETECTED Final   Candida albicans NOT DETECTED NOT DETECTED Final   Candida auris NOT DETECTED NOT DETECTED Final   Candida glabrata NOT  DETECTED NOT DETECTED Final   Candida krusei NOT DETECTED NOT DETECTED Final   Candida parapsilosis NOT DETECTED NOT DETECTED Final   Candida tropicalis NOT DETECTED NOT DETECTED Final   Cryptococcus neoformans/gattii NOT DETECTED NOT DETECTED Final    Comment: Performed at [facility], [address], [place], [place] [address]  Culture, blood (Routine x 2)     Status: Abnormal   Collection Time: 06/29/21 12:05 PM   Specimen: Neck; Blood  Result Value Ref Range Status  Specimen Description   Final    NECK BLOOD Performed at [facility], [address] [place], [place] [address]    Special Requests   Final    Blood Culture adequate volume BOTTLES DRAWN AEROBIC AND ANAEROBIC Performed at [facility], [address] [place], [place] [address]    Culture  Setup Time   Final    GRAM POSITIVE COCCI IN CHAINS ANAEROBIC BOTTLE ONLY CRITICAL VALUE NOTED.  VALUE IS CONSISTENT WITH PREVIOUSLY REPORTED AND CALLED VALUE.    Culture (A)  Final    GROUP A STREP (S.PYOGENES) ISOLATED SUSCEPTIBILITIES PERFORMED ON PREVIOUS CULTURE WITHIN THE LAST 5 DAYS. Performed at [facility], [place] [address] [place], [place] [address]    Report Status 07/02/2021 FINAL  Final  MRSA Next Gen by PCR, Nasal     Status: None   Collection Time: 06/29/21  8:15 PM   Specimen: Nasal Mucosa; Nasal Swab  Result Value Ref Range Status   MRSA by PCR Next Gen NOT DETECTED NOT DETECTED Final    Comment: (NOTE) The GeneXpert MRSA Assay (FDA approved for NASAL specimens only), is one component of a comprehensive MRSA colonization surveillance program. It is not intended to diagnose MRSA infection nor to guide or monitor treatment for MRSA infections. Test performance is not FDA approved in patients less than 84 years old. Performed at [facility], [place] [address], [place], [place] [address]   C Difficile Quick Screen w PCR reflex     Status: None   Collection  Time: 06/30/21  9:04 AM   Specimen: STOOL  Result Value Ref Range Status   C Diff antigen NEGATIVE NEGATIVE Final   C Diff toxin NEGATIVE NEGATIVE Final   C Diff interpretation No C. difficile detected.  Final    Comment: Performed at [facility], [place] [address], [place], [place] [address]  Gastrointestinal Panel by PCR , Stool     Status: Abnormal   Collection Time: 06/30/21  9:04 AM   Specimen: Stool  Result Value Ref Range Status   Campylobacter species NOT DETECTED NOT DETECTED Final   Plesimonas shigelloides NOT DETECTED NOT DETECTED Final   Salmonella species NOT DETECTED NOT DETECTED Final   Yersinia enterocolitica NOT DETECTED NOT DETECTED Final   Vibrio species NOT DETECTED NOT DETECTED Final   Vibrio cholerae NOT DETECTED NOT DETECTED Final   Enteroaggregative E coli (EAEC) NOT DETECTED NOT DETECTED Final   Enterotoxigenic E coli (ETEC) NOT DETECTED NOT DETECTED Final   Shiga like toxin producing E coli (STEC) DETECTED (A) NOT DETECTED Final    Comment: RESULT CALLED TO, READ BACK BY AND VERIFIED WITH: JAMIE DOMAN 04/30/22 1243 MU    E. coli O157 NOT DETECTED NOT DETECTED Final   Shigella/Enteroinvasive E coli (EIEC) NOT DETECTED NOT DETECTED Final   Cryptosporidium NOT DETECTED NOT DETECTED Final   Cyclospora cayetanensis NOT DETECTED NOT DETECTED Final   Entamoeba histolytica NOT DETECTED NOT DETECTED Final   Giardia lamblia NOT DETECTED NOT DETECTED Final   Adenovirus F40/41 NOT DETECTED NOT DETECTED Final   Astrovirus NOT DETECTED NOT DETECTED Final   Norovirus GI/GII NOT DETECTED NOT DETECTED Final   Rotavirus A NOT DETECTED NOT DETECTED Final   Sapovirus (I, II, IV, and V) NOT DETECTED NOT DETECTED Final    Comment: Performed at [facility], [address] [place],  [address]  Culture, blood (routine x 2)  Status: None (Preliminary result)   Collection Time: 06/30/21 12:02 PM   Specimen: BLOOD  Result Value Ref Range  Status   Specimen Description   Final    BLOOD RIGHT ANTECUBITAL Performed at [facility] [address], [place], [place] [address]    Special Requests   Final    BOTTLES DRAWN AEROBIC ONLY Blood Culture adequate volume Performed at [place] [address], [place], [place] [address]    Culture   Final    NO GROWTH 2 DAYS Performed at [place] [address], [place], [place] [address]    Report Status PENDING  Incomplete  Culture, blood (routine x 2)     Status: None (Preliminary result)   Collection Time: 06/30/21 12:02 PM   Specimen: BLOOD  Result Value Ref Range Status   Specimen Description   Final    BLOOD RIGHT ANTECUBITAL Performed at [facility] [address], [place], [place] [address]    Special Requests   Final    BOTTLES DRAWN AEROBIC ONLY Blood Culture adequate volume Performed at [place] [address], [place], [place] [address]    Culture   Final    NO GROWTH 2 DAYS Performed at [facility] [address] [place], [place] [address]    Report Status PENDING  Incomplete    Antimicrobials: Anti-infectives (From admission, onward)    Start     Dose/Rate Route Frequency Ordered Stop   07/01/21 1200  penicillin G potassium 8 Million Units in dextrose 5 % 500 mL continuous infusion        8 Million Units 41.7 mL/hr over 12 Hours Intravenous Every 12 hours 07/01/21 1103     06/30/21 2200  vancomycin (VANCOREADY) IVPB 1500 mg/300 mL  Status:  Discontinued        1,500 mg 150 mL/hr over 120 Minutes Intravenous Every 48 hours 06/29/21 1050 06/30/21 0455   06/30/21 0600  ceFAZolin (ANCEF) IVPB 2g/100 mL premix  Status:  Discontinued        2 g 200 mL/hr over 30 Minutes Intravenous Every 8 hours 06/30/21 0501 07/01/21 1103   06/30/21 0600  clindamycin (CLEOCIN) IVPB 600 mg  Status:  Discontinued        600 mg 100 mL/hr over 30 Minutes Intravenous Every 8  hours 06/30/21 0501 07/01/21 1108   06/29/21 1100  cefTRIAXone (ROCEPHIN) 2 g in sodium chloride 0.9 % 100 mL IVPB  Status:  Discontinued        2 g 200 mL/hr over 30 Minutes Intravenous Every 24 hours 06/29/21 1043 06/30/21 0455   06/29/21 1045  vancomycin (VANCOCIN) IVPB 1000 mg/200 mL premix        1,000 mg 200 mL/hr over 60 Minutes Intravenous  Once 06/29/21 1039 06/29/21 1256   06/29/21 1045  levofloxacin (LEVAQUIN) IVPB 750 mg  Status:  Discontinued        750 mg 100 mL/hr over 90 Minutes Intravenous  Once 06/29/21 1039 06/29/21 1049      Culture/Microbiology    Component Value Date/Time   SDES  06/30/2021 1202    BLOOD RIGHT ANTECUBITAL Performed at [facility], [place] [address] [place], [place] [address]    SDES  06/30/2021 1202    BLOOD RIGHT ANTECUBITAL Performed at [facility], [place] [address], [place], [place] [address]    Thermal  06/30/2021 1202    BOTTLES DRAWN AEROBIC  ONLY Blood Culture adequate volume Performed at [place] [address] [place], [place] [address]    SPECREQUEST  06/30/2021 1202    BOTTLES DRAWN AEROBIC ONLY Blood Culture adequate volume Performed at [facility] [address] [place], [place] [address]    CULT  06/30/2021 1202    NO GROWTH 2 DAYS Performed at [place] [address] [place], [place] [address]    CULT  06/30/2021 1202    NO GROWTH 2 DAYS Performed at [facility], [place] [address] [place], [place] [address]    REPTSTATUS PENDING 06/30/2021 1202   REPTSTATUS PENDING 06/30/2021 1202    Radiology Studies: VAS [place] UPPER EXTREMITY VENOUS DUPLEX  Result Date: 07/01/2021 UPPER VENOUS STUDY  Patient Name:  [name] [name]  Date of Exam:   07/01/2021 Medical Rec #: [id number]     Accession #:    [id number] Date of Birth: 1951-03-19    Patient Gender: F Patient Age:   63 years Exam Location:  [facility] Procedure:      VAS  [place] UPPER EXTREMITY VENOUS DUPLEX Referring Phys: ERIC CHEN --------------------------------------------------------------------------------  Indications: Edema, and Erythema Risk Factors: None identified. Comparison Study: No prior studies. Performing Technologist: Oliver Hum RVT  Examination Guidelines: A complete evaluation includes B-mode imaging, spectral Doppler, color Doppler, and power Doppler as needed of all accessible portions of each vessel. Bilateral testing is considered an integral part of a complete examination. Limited examinations for reoccurring indications may be performed as noted.  Right Findings: +----------+------------+---------+-----------+----------+-------+  RIGHT      Compressible Phasicity Spontaneous Properties Summary  +----------+------------+---------+-----------+----------+-------+  Subclavian     Full        Yes        Yes                         +----------+------------+---------+-----------+----------+-------+  Left Findings: +----------+------------+---------+-----------+----------+-------+  LEFT       Compressible Phasicity Spontaneous Properties Summary  +----------+------------+---------+-----------+----------+-------+  IJV            Full        Yes        Yes                         +----------+------------+---------+-----------+----------+-------+  Subclavian     Full        Yes        Yes                         +----------+------------+---------+-----------+----------+-------+  Axillary       Full        Yes        Yes                         +----------+------------+---------+-----------+----------+-------+  Brachial       Full        Yes        Yes                         +----------+------------+---------+-----------+----------+-------+  Radial         Full                                               +----------+------------+---------+-----------+----------+-------+  Ulnar          Full                                                +----------+------------+---------+-----------+----------+-------+  Cephalic       Full                                               +----------+------------+---------+-----------+----------+-------+  Basilic        Full                                               +----------+------------+---------+-----------+----------+-------+  Summary:  Right: No evidence of thrombosis in the subclavian.  Left: No evidence of deep vein thrombosis in the upper extremity. No evidence of superficial vein thrombosis in the upper extremity.  *See table(s) above for measurements and observations.  Diagnosing physician: Monica Martinez MD Electronically signed by Monica Martinez MD on 07/01/2021 at 2:37:09 PM.    Final      LOS: 3 days   Antonieta Pert, MD [facility]  07/02/2021, 11:34 AM

## 2021-07-02 NOTE — Assessment & Plan Note (Addendum)
Creatinine improved to 1.4.  BMP in 1 week Recent Labs  Lab 06/29/21 0951 06/30/21 0250 07/01/21 0236  BUN 27* 24* 20  CREATININE 1.83* 1.44* 1.45*

## 2021-07-02 NOTE — Assessment & Plan Note (Addendum)
Monitor. Stable Recent Labs  Lab 06/29/21 0951 06/30/21 0250 07/01/21 0236  NA 127* 136 131*

## 2021-07-02 NOTE — Assessment & Plan Note (Addendum)
Resume home meds Lasix and spironolactone on d/c

## 2021-07-02 NOTE — Assessment & Plan Note (Addendum)
In the setting of sepsis and cellulitis, rate controlled.  On Xarelto.TSH euthyroid,.patient has a plan to obtain echo as outpatient ordered by Dr. Haroldine Laws

## 2021-07-02 NOTE — Assessment & Plan Note (Signed)
On Xarelto continue the same

## 2021-07-02 NOTE — Assessment & Plan Note (Signed)
See #1 °

## 2021-07-02 NOTE — Procedures (Signed)
Vascular and Interventional Radiology Procedure Note  Patient: [name] [name] DOB: 1950-09-17 Medical Record Number: [id number] Note Date/Time: 07/02/21 5:13 PM   Performing Physician: Michaelle Birks, MD Assistant(s): None  Diagnosis: Poor IV access and TPN access  Procedure: TUNNELED POWERLINE CATHETER PLACEMENT  Anesthesia: Local Anesthetic Complications: None Estimated Blood Loss:  0 mL Specimens:  None  Findings:  Successful placement of left-sided,  30 cm  (tip-to-cuff), tunneled Powerline catheter with the tip of the catheter in the proximal right atrium.  Plan: Catheter ready for use.  See detailed procedure note with images in PACS. The patient tolerated the procedure well without incident or complication and was returned to Floor Bed in stable condition.    Michaelle Birks, MD [facility]   Pager. [place]

## 2021-07-02 NOTE — Assessment & Plan Note (Signed)
Continue PPI ?

## 2021-07-03 ENCOUNTER — Encounter: Payer: Self-pay | Admitting: Internal Medicine

## 2021-07-03 DIAGNOSIS — L03114 Cellulitis of left upper limb: Secondary | ICD-10-CM | POA: Diagnosis not present

## 2021-07-03 DIAGNOSIS — B950 Streptococcus, group A, as the cause of diseases classified elsewhere: Secondary | ICD-10-CM | POA: Diagnosis not present

## 2021-07-03 DIAGNOSIS — R7881 Bacteremia: Secondary | ICD-10-CM | POA: Diagnosis not present

## 2021-07-03 DIAGNOSIS — K912 Postsurgical malabsorption, not elsewhere classified: Secondary | ICD-10-CM | POA: Diagnosis not present

## 2021-07-03 LAB — POTASSIUM: Potassium: 3.3 mmol/L — ABNORMAL LOW (ref 3.5–5.1)

## 2021-07-03 MED ORDER — CEFAZOLIN IV (FOR PTA / DISCHARGE USE ONLY): 2.0000 g | Freq: Three times a day (TID) | INTRAVENOUS | 0 refills | Status: AC

## 2021-07-03 MED ORDER — POTASSIUM CHLORIDE CRYS ER 20 MEQ PO TBCR
40.0000 meq | EXTENDED_RELEASE_TABLET | Freq: Once | ORAL | Status: AC
  Administered 2021-07-03: 40 meq via ORAL
  Filled 2021-07-03: qty 2

## 2021-07-03 MED ORDER — CEFAZOLIN SODIUM-DEXTROSE 2-4 GM/100ML-% IV SOLN
2.0000 g | Freq: Three times a day (TID) | INTRAVENOUS | Status: DC
Start: 2021-07-03 — End: 2021-07-03
  Administered 2021-07-03: 2 g via INTRAVENOUS
  Filled 2021-07-03: qty 100

## 2021-07-03 MED ORDER — SODIUM CHLORIDE 0.9 % IV SOLN
2.0000 g | INTRAVENOUS | Status: DC
  Administered 2021-07-03: 2 g via INTRAVENOUS
  Filled 2021-07-03: qty 20

## 2021-07-03 NOTE — Progress Notes (Signed)
PHARMACY CONSULT NOTE FOR:  OUTPATIENT  PARENTERAL ANTIBIOTIC THERAPY (OPAT)  Indication: Left Arm Cellulitis and Group A Strept. Bacteremia Regimen: Cefazolin 2g Q8H End date: February 28th, 2023  IV antibiotic discharge orders are pended. To discharging provider:  please sign these orders via discharge navigator,  Select New Orders & click on the button choice - Manage This Unsigned Work.     Thank you for allowing pharmacy to be a part of this patient's care.  Elpidio Anis, PharmD Candidate 2023 07/03/2021, 9:07 AM

## 2021-07-03 NOTE — Progress Notes (Signed)
Discharge education and instructions given to pt. Pt disconnected from monitors and belongings packed up. Pt due to receive IV antibiotic dose prior to leaving. Ok per MD to leave pt off monitors while waiting to receive antibiotic dose. All IV lines from pt removed except the tunneled powerline catheter that the patient will be discharged with for further TPN/antibiotics.

## 2021-07-03 NOTE — Progress Notes (Signed)
Discharge education and instructions given to pt. Pt disconnected from monitors and belongings packed up. Pt due to receive IV antibiotic dose prior to leaving. Ok per MD to leave pt off monitors while waiting to receive antibiotic dose. All IV lines from pt removed except the tunneled IJ catheter that the patient will be discharged with for further TPN/antibiotics.

## 2021-07-03 NOTE — Discharge Summary (Signed)
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  ID and IR consulted repeat blood cultures remain negative from 2/19, IR consulted to change central line over wire.Patient has an history of chronic diarrhea since on TPN, GI pathogen with Shiga-like toxin producing E. coli, no concerning features.  ID planning for Ancef upon discharge, Patient's PICC line/port was exchanged by IR over the guidewire 2/21. At this time patient is medically stable for discharge.  Patient to resume TPN at home  Discharge Diagnoses: Active Problems:   Streptococcal sepsis, unspecified ([place])   Left arm cellulitis   Cellulitis of left breast   Acute renal failure superimposed on stage 3a chronic kidney disease (HCC)   New onset a-fib (HCC)   STEC (Shiga toxin-producing Escherichia coli)   Acquired short bowel syndrome   GERD (gastroesophageal reflux disease)   Hyponatremia   Hypokalemia   Chronic systolic heart failure (HCC) - LVEF 35-40%   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HCC)   Humerus fracture - right Streptococcal sepsis,  unspecified ([place])- (present on admission) Group A strep bacteremia from cellulitis, left arm cellulitis/left breast cellulitis. Penicillin allergy-remote history of remote allergy buthas tolerated Augmentin: Sepsis POA, sepsis physiology resolved.  ID following, repeat blood cultures 2/19 no growth to date.  Now on IV penicillin.Concern of left central line in subclavian infection, patient and family refuse to remove that line due to difficult access in the past.  IR consulted to exchange over the wire-completed 2/19.  Plan is to discharge patient home for 7 days-of IV Ancef   Cellulitis of left breast- (present on admission) Improving.  See above wound care  Left arm cellulitis- (present on admission) See #1   Acute renal failure superimposed on stage 3a chronic kidney disease ([place])- (present on admission) Creatinine improved to 1.4.  BMP in 1 week Recent Labs  Lab 06/29/21 0951 06/30/21 0250 07/01/21 0236  BUN 27* 24* 20  CREATININE 1.83* 1.44* 1.45*    New onset a-fib (HCC) In the setting of sepsis and cellulitis, rate controlled.  On Xarelto.TSH euthyroid,.patient has a plan to obtain echo as outpatient ordered by Dr. Haroldine Laws    STEC (Shiga toxin-producing Escherichia coli) Noted in GI panel.  No bloody diarrhea, patient has chronic diarrhea with each meal and that has not changed, tolerating diet.   Humerus fracture - right- (present on admission) Orthopedic input appreciated continue conservative management and outpatient follow-up   Chronic renal failure, stage 3a (HCC) Stable.  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[place])- (present on admission) On Xarelto continue the same  Chronic systolic heart failure (HCC) - LVEF 35-40%- (present on admission) Resume home meds Lasix and spironolactone on d/c  Hypokalemia Repleted.  Patient will continue with her TPN which should help her electrolyte issues, she gets routine labs done from outpatient while she is on TPN  Hyponatremia-  (present on admission)  Monitor. 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   On TPN 4-5 nights/wk, w/ left tunneled subclavian cathete (September 2022 with great difficulty). Continue p.o. diet, continue TPN at home.       Consultants: Orthopedics, infectious disease Procedures performed: picc exchange  Disposition: Home.  Refuses home health reports she is able to do that Diet recommendation:  Diet Orders (From admission, onward)     Start     Ordered   06/29/21 2115  Diet regular Room service appropriate? Yes; Fluid consistency: Thin  Diet effective now       Question Answer Comment  Room service appropriate? Yes   Fluid consistency: Thin      06/29/21 2114             DISCHARGE MEDICATION: Allergies as of 07/03/2021   No Active Allergies      Medication List     TAKE these medications    acetaminophen 500 MG tablet Commonly known as: TYLENOL Take 1,000 mg by mouth daily as needed for mild pain.   ADULT GUMMY PO Take 2 tablets by mouth daily.   ADULT TPN Inject 1,800 mLs into the vein 4 (four) times a week. Pt receives home TPN from [facility]:  1800 mL bag, four nights weekly (Monday, Tuesday, Wednesday, Thursday for 8 hours (includes 1 hour taper up and down).   baclofen 10 MG tablet Commonly known as: LIORESAL Take 1 tablet (10 mg total) by mouth 2 (two) times daily as needed for muscle spasms.   CALCIUM + D3 PO Take 2 tablets by mouth daily.   ceFAZolin  IVPB Commonly known as: ANCEF Inject 2 g into the vein every 8 (eight) hours for 6 days. Indication:  Left Arm Cellulitis and Group A Strept. Bacteremia First Dose: Yes Last Day of Therapy:  February 28th, 2023 Labs - Once  weekly:  CBC/D and BMP, Labs - Every other week:  ESR and CRP Method of administration: IV Push Method of administration may be changed at the discretion of home infusion pharmacist based upon assessment of the patient and/or caregiver's ability to self-administer the medication ordered.   cetirizine 10 MG tablet Commonly known as: ZYRTEC Take 1 tablet (10 mg total) by mouth at bedtime as needed for allergies.   clobetasol ointment 0.05 % Commonly known as: TEMOVATE Apply 1 application topically 2 (two) times daily.   diphenhydrAMINE 25 mg capsule Commonly known as: BENADRYL Take 25-50 mg by mouth every 6 (six) hours as needed for itching or sleep.   empagliflozin 10 MG Tabs tablet Commonly known as: Jardiance Take 1 tablet (10 mg total) by mouth daily before breakfast.   fluticasone 50 MCG/ACT nasal spray Commonly known as: FLONASE Place 2 sprays into both nostrils daily as needed for allergies or rhinitis.   furosemide 20 MG tablet Commonly known as: LASIX TAKE ONE TABLET BY MOUTH AS NEEDED FOR SWELLING   HEPARIN LOCK FLUSH IJ Inject 5 mLs as directed 4 (four) times a week. Monday, Tuesday, Wednesday, Thursday in the morning with TPN  losartan 25 MG tablet Commonly known as: COZAAR Take 0.5 tablets (12.5 mg total) by mouth at bedtime.   meclizine 25 MG tablet Commonly known as: ANTIVERT Take 1 tablet (25 mg total) by mouth 3 (three) times daily as needed for dizziness.   omeprazole 40 MG capsule Commonly known as: PRILOSEC TAKE 1 CAPSULE BY MOUTH EVERY DAY BEFORE BREAKFAST   ondansetron 8 MG disintegrating tablet Commonly known as: ZOFRAN-ODT TAKE 1 TABLET (8 MG TOTAL) BY MOUTH EVERY 8 (EIGHT) HOURS AS NEEDED FOR NAUSEA OR VOMITING.   sodium chloride 0.9 % infusion Inject 900 mLs into the vein every 14 (fourteen) days. Uses for TPN   spironolactone 25 MG tablet Commonly known as: ALDACTONE Take 0.5 tablets (12.5 mg total) by mouth daily.   Teduglutide (rDNA)  5 MG Kit Inject 3.1 application into the skin every morning.   triamcinolone cream 0.1 % Commonly known as: KENALOG Apply 1 application topically as needed (itching).   vitamin E 1000 UNIT capsule Take 1,000 Units by mouth daily.   Xarelto 20 MG Tabs tablet Generic drug: rivaroxaban TAKE 1 TABLET (20 MG TOTAL) BY MOUTH DAILY WITH SUPPER.               Discharge Care Instructions  (From admission, onward)           Start     Ordered   07/03/21 0000  Change dressing on IV access line weekly and PRN  (Home infusion instructions - [facility] )        07/03/21 0916            Follow-up Information     Marchia Bond, MD Follow up in 2 week(s).   Specialty: Orthopedic Surgery Contact information: [place] [address] [phone]         [name], MD Follow up in 1 week(s).   Specialty: Family Medicine Contact information: [address] [place] [address] [phone]                 Discharge Exam: Filed Weights   07/01/21 0424 07/02/21 0448 07/03/21 0500  Weight: 70.3 kg 70.3 kg 72.2 kg  Condition at discharge: good  The results of significant diagnostics from this hospitalization (including imaging, microbiology, ancillary and laboratory) are listed below for reference.   Imaging Studies: DG Chest 2 View  Result Date: 06/12/2021 CLINICAL DATA:  Shortness of breath.  History of heart failure. EXAM: CHEST - 2 VIEW COMPARISON:  CT chest 05/15/2021.  Chest x-ray 05/28/2018. FINDINGS: Central line with tip over SVC. Vascular stent noted over the mediastinum. Cardiomegaly. No pulmonary venous congestion. Mild chronic interstitial changes and stable right base pleural-parenchymal thickening consistent with scarring. No evidence of focal infiltrate or overt CHF. Reference made to recent chest CT report for discussion of pulmonary nodules present. No acute bony abnormality. IMPRESSION:  Central line noted over SVC. Vascular stents noted over the mediastinum. Cardiomegaly. No pulmonary venous congestion. Mild chronic interstitial changes and stable right base pleural-parenchymal thickening consistent with scarring. No evidence of infiltrate or overt CHF. Electronically Signed   By: Marcello Moores  Register M.D.   On: 06/12/2021 10:20   DG Shoulder Right  Result Date: 06/29/2021 CLINICAL DATA:  Right shoulder pain. EXAM: RIGHT SHOULDER - 2+ VIEW COMPARISON:  None. FINDINGS: Limited two views study shows deformity of the right humeral neck compatible with comminuted fracture. Fracture line not well demonstrated due to positioning and demineralization. IMPRESSION:  Comminuted right humeral neck fracture, poorly demonstrated due to positioning and demineralization. Electronically Signed   By: Misty Stanley M.D.   On: 06/29/2021 12:25   CT SHOULDER RIGHT WO CONTRAST  Result Date: 06/29/2021 CLINICAL DATA:  Shoulder trauma. Earlier same day shoulder radiograph demonstrates right humeral neck fracture. EXAM: CT OF THE UPPER RIGHT EXTREMITY WITHOUT CONTRAST TECHNIQUE: Multidetector CT imaging of the upper right extremity was performed according to the standard protocol. RADIATION DOSE REDUCTION: This exam was performed according to the departmental dose-optimization program which includes automated exposure control, adjustment of the mA and/or kV according to patient size and/or use of iterative reconstruction technique. COMPARISON:  CT chest 05/15/2021, 11/08/2020; shoulder radiograph 01/10/2020, 10/17/2019 FINDINGS: Bones/Joint/Cartilage Healing oblique fracture of the surgical neck and proximal shaft of the right humerus, not significantly changed compared to prior CT chest 11/08/2020. No acute fracture of the right humerus, scapula, clavicle, or visualized right ribs. No dislocation of the right humeral head. Very mild osteoarthritic changes of the acromioclavicular joint. No joint effusion. Ligaments  Suboptimally assessed by CT. Muscles and Tendons No acute abnormality. Soft tissues Unremarkable. No subcutaneous fat stranding or fluid collection in the visualized right upper arm or at the right shoulder. Interval development of a 0.8 cm solid nodule in the right upper lobe at the apex as well as a 0.6 cm solid nodule at the location of the previously described clustered nodular opacities on recent CT chest 05/15/2021 (series 7, images 46, 63). Trace right pleural fluid with adjacent subsegmental atelectasis. Partially visualized left brachiocephalic/SVC stent with central venous catheter. IMPRESSION: 1. Healing oblique fracture of the surgical neck and proximal shaft of the right humerus, not significantly changed compared to prior CT chest 11/08/2020. No acute osseous abnormality. 2. Unremarkable soft tissues of the visualized right upper arm and right shoulder. 3. Interval development of 2 solid pulmonary nodules in the right upper lobe since recent CT chest 05/15/2021, favored to be infectious or inflammatory given waxing and waning nodules on prior cross-sectional imaging. Non-contrast chest CT at 3-6 months is recommended. If the nodules are stable at time of repeat CT, then future CT at 18-24 months (from today's scan) is considered optional for low-risk patients, but is recommended for high-risk patients. This recommendation follows the consensus statement: Guidelines for Management of Incidental Pulmonary Nodules Detected on CT Images: From the Fleischner Society 2017; Radiology 2017; 284:228-243. Electronically Signed   By: Ileana Roup M.D.   On: 06/29/2021 18:09   IR Fluoro Guide CV Line Left  Result Date: 07/03/2021 INDICATION: Malfunctioning PICC line.  Chronic TPN. EXAM: FLUOROSCOPIC GUIDED TUNNELED POWERLINE CENTRAL VENOUS CATHETER EXCHANGE MEDICATIONS: None. CONTRAST:  None FLUOROSCOPY TIME:  1 minutes 6 seconds, 1 mGy. COMPLICATIONS: None immediate. TECHNIQUE: The procedure, risks, benefits,  and alternatives were explained to the patient and/or patient's representative and informed written consent was obtained. The LEFT neck and chest and external portion of the existing catheter line was prepped with chlorhexidine in a sterile fashion, and a sterile drape was applied covering the operative field. Maximum barrier sterile technique with sterile gowns and gloves were used for the procedure. A timeout was performed prior to the initiation of the procedure. Local anesthesia was provided with 1% lidocaine. The existing powerline was cannulated with an 0.0035" wire which was advanced through the catheter. The catheter was removed over the wire, and exchanged for a 30-cm, 4 - [place], single lumen powerline to the level of the proximal RIGHT atrium junction. A post procedure spot  fluoroscopic image was obtained. The catheter easily aspirated and flushed and was sutured in place. A dressing was placed. The patient tolerated the procedure well without immediate post procedural complication. FINDINGS: After catheter exchange, the tip lies within the superior cavoatrial junction. The catheter aspirates and flushes normally and is ready for immediate use. IMPRESSION: Successful fluoroscopic guided exchange of LEFT IJ approach 30 cm, 4 Fr, single lumen Powerline catheter, as above The tip of the catheter is positioned within the proximal RIGHT atrium. The CVC is ready for immediate use. PLAN: As previously mentioned by my IR colleagues on prior examinations, given patient's poor central venous access, DO NOT REMOVE THIS CENTRAL VENOUS CATHETER unless under the explicit direction of the interventional radiology service. Michaelle Birks, MD [facility] Radiology Electronically Signed   By: Michaelle Birks M.D.   On: 07/03/2021 10:27   DG Chest Port 1 View  Result Date: 06/29/2021 CLINICAL DATA:  Feeling bad. EXAM: PORTABLE CHEST 1 VIEW COMPARISON:  06/11/2021 FINDINGS: Or  959 hours. The cardio pericardial silhouette is enlarged. Interstitial markings are diffusely coarsened with chronic features. Minimal atelectasis noted both lung bases. Possible tiny effusions. Left-sided central line remains in place. Bones are diffusely demineralized. Telemetry leads overlie the chest. IMPRESSION: Low volume film with bibasilar atelectasis. Possible tiny bilateral pleural effusions. Electronically Signed   By: Misty Stanley M.D.   On: 06/29/2021 10:05   VAS [place] UPPER EXTREMITY VENOUS DUPLEX  Result Date: 07/01/2021 UPPER VENOUS STUDY  Patient Name:  TEREASE MARCOTTE  Date of Exam:   07/01/2021 Medical Rec #: [id number]     Accession #:    [id number] Date of Birth: 22-Sep-1950    Patient Gender: F Patient Age:   18 years Exam Location:  [facility] Procedure:      VAS [place] UPPER EXTREMITY VENOUS DUPLEX Referring Phys: ERIC CHEN --------------------------------------------------------------------------------  Indications: Edema, and Erythema Risk Factors: None identified. Comparison Study: No prior studies. Performing Technologist: Oliver Hum RVT  Examination Guidelines: A complete evaluation includes B-mode imaging, spectral Doppler, color Doppler, and power Doppler as needed of all accessible portions of each vessel. Bilateral testing is considered an integral part of a complete examination. Limited examinations for reoccurring indications may be performed as noted.  Right Findings: +----------+------------+---------+-----------+----------+-------+  RIGHT      Compressible Phasicity Spontaneous Properties Summary  +----------+------------+---------+-----------+----------+-------+  Subclavian     Full        Yes        Yes                         +----------+------------+---------+-----------+----------+-------+  Left Findings: +----------+------------+---------+-----------+----------+-------+  LEFT       Compressible Phasicity Spontaneous Properties Summary   +----------+------------+---------+-----------+----------+-------+  IJV            Full        Yes        Yes                         +----------+------------+---------+-----------+----------+-------+  Subclavian     Full        Yes        Yes                         +----------+------------+---------+-----------+----------+-------+  Axillary       Full        Yes  Yes                         +----------+------------+---------+-----------+----------+-------+  Brachial       Full        Yes        Yes                         +----------+------------+---------+-----------+----------+-------+  Radial         Full                                               +----------+------------+---------+-----------+----------+-------+  Ulnar          Full                                               +----------+------------+---------+-----------+----------+-------+  Cephalic       Full                                               +----------+------------+---------+-----------+----------+-------+  Basilic        Full                                               +----------+------------+---------+-----------+----------+-------+  Summary:  Right: No evidence of thrombosis in the subclavian.  Left: No evidence of deep vein thrombosis in the upper extremity. No evidence of superficial vein thrombosis in the upper extremity.  *See table(s) above for measurements and observations.  Diagnosing physician: Monica Martinez MD Electronically signed by Monica Martinez MD on 07/01/2021 at 2:37:09 PM.    Final    CT Extrem Up Entire Arm L WO/CM  Result Date: 06/29/2021 CLINICAL DATA:  Left upper extremity pain and swelling, initial encounter EXAM: CT OF THE UPPER LEFT EXTREMITY WITHOUT CONTRAST TECHNIQUE: Multidetector CT imaging of the upper left extremity was performed according to the standard protocol. RADIATION DOSE REDUCTION: This exam was performed according to the departmental dose-optimization program which includes  automated exposure control, adjustment of the mA and/or kV according to patient size and/or use of iterative reconstruction technique. COMPARISON:  None. FINDINGS: Bones/Joint/Cartilage No acute fracture or dislocation is noted. Postsurgical changes in the proximal left femur are noted included on the distal aspect of the upper extremity. Underlying bony thorax shows no rib fractures. Ligaments Suboptimally assessed by CT. Muscles and Tendons Musculature of the left upper extremity appears within normal limits. No focal fluid collection is identified. Soft tissues Very mild edematous changes are noted in the proximal forearm and distal upper arm as well as in the left breast consistent with the given clinical history. These are most prominent in the region of the olecranon in the left elbow. No focal fluid collection to suggest abscess is noted. These changes are most consistent with cellulitis. Some reactive lymph nodes are noted in the left axilla consistent with the peripheral inflammatory change. IMPRESSION: Soft tissue swelling in the left upper extremity left breast  similar to that noted on physical exam consistent with focal study lightest. No abscess is noted. Some reactive lymphadenopathy is seen in the left axilla. No bony abnormality is noted. Electronically Signed   By: Inez Catalina M.D.   On: 06/29/2021 22:14    Microbiology: Results for orders placed or performed during the hospital encounter of 06/29/21  Culture, blood (Routine x 2)     Status: Abnormal   Collection Time: 06/29/21  9:51 AM   Specimen: BLOOD RIGHT HAND  Result Value Ref Range Status   Specimen Description   Final    BLOOD RIGHT HAND BLOOD Performed at [facility], [address] [place], [place] [address]    Special Requests   Final    Blood Culture adequate volume BOTTLES DRAWN AEROBIC AND ANAEROBIC Performed at [facility], [address] [place], [place] [address]    Culture  Setup Time    Final    GRAM POSITIVE COCCI IN CHAINS IN BOTH AEROBIC AND ANAEROBIC BOTTLES CRITICAL RESULT CALLED TO, READ BACK BY AND VERIFIED WITH: L POINDEXTER,[email or web address]  06/30/21 [place]    Culture (A)  Final    GROUP A STREP (S.PYOGENES) ISOLATED HEALTH DEPARTMENT NOTIFIED Performed at [facility], [place] [address] [place], [place] [address]    Report Status 07/02/2021 FINAL  Final   Organism ID, Bacteria GROUP A STREP (S.PYOGENES) ISOLATED  Final      Susceptibility   Group a strep (s.pyogenes) isolated - MIC*    PENICILLIN <=0.06 SENSITIVE Sensitive     CEFTRIAXONE <=0.12 SENSITIVE Sensitive     ERYTHROMYCIN <=0.12 SENSITIVE Sensitive     LEVOFLOXACIN 0.5 SENSITIVE Sensitive     VANCOMYCIN 0.5 SENSITIVE Sensitive     * GROUP A STREP (S.PYOGENES) ISOLATED  Resp Panel by RT-PCR (Flu A&B, Covid) Nasopharyngeal Swab     Status: None   Collection Time: 06/29/21  9:51 AM   Specimen: Nasopharyngeal Swab; Nasopharyngeal(NP) swabs in vial transport medium  Result Value Ref Range Status   SARS Coronavirus 2 by RT PCR NEGATIVE NEGATIVE Final    Comment: (NOTE) SARS-CoV-2 target nucleic acids are NOT DETECTED.  The SARS-CoV-2 RNA is generally detectable in upper respiratory specimens during the acute phase of infection. The lowest concentration of SARS-CoV-2 viral copies this assay can detect is 138 copies/mL. A negative result does not preclude SARS-Cov-2 infection and should not be used as the sole basis for treatment or other patient management decisions. A negative result may occur with  improper specimen collection/handling, submission of specimen other than nasopharyngeal swab, presence of viral mutation(s) within the areas targeted by this assay, and inadequate number of viral copies(<138 copies/mL). A negative result must be combined with clinical observations, patient history, and epidemiological information. The expected result is Negative.  Fact Sheet for Patients:   EntrepreneurPulse.com.au  Fact Sheet for Healthcare Providers:  IncredibleEmployment.be  This test is no t yet approved or cleared by the [place] FDA and  has been authorized for detection and/or diagnosis of SARS-CoV-2 by FDA under an Emergency Use Authorization (EUA). This EUA will remain  in effect (meaning this test can be used) for the duration of the COVID-19 declaration under Section 564(b)(1) of the Act, 21 U.S.C.section 360bbb-3(b)(1), unless the authorization is terminated  or revoked sooner.       Influenza A by PCR NEGATIVE NEGATIVE Final   Influenza B by PCR NEGATIVE NEGATIVE Final    Comment: (NOTE) The Xpert Xpress  SARS-CoV-2/FLU/RSV plus assay is intended as an aid in the diagnosis of influenza from Nasopharyngeal swab specimens and should not be used as a sole basis for treatment. Nasal washings and aspirates are unacceptable for Xpert Xpress SARS-CoV-2/FLU/RSV testing.  Fact Sheet for Patients: EntrepreneurPulse.com.au  Fact Sheet for Healthcare Providers: IncredibleEmployment.be  This test is not yet approved or cleared by the [place] FDA and has been authorized for detection and/or diagnosis of SARS-CoV-2 by FDA under an Emergency Use Authorization (EUA). This EUA will remain in effect (meaning this test can be used) for the duration of the COVID-19 declaration under Section 564(b)(1) of the Act, 21 U.S.C. section 360bbb-3(b)(1), unless the authorization is terminated or revoked.  Performed at [facility], [address] [place], [place] [address]   Blood Culture ID Panel (Reflexed)     Status: Abnormal   Collection Time: 06/29/21  9:51 AM  Result Value Ref Range Status   Enterococcus faecalis NOT DETECTED NOT DETECTED Final   Enterococcus Faecium NOT DETECTED NOT DETECTED Final   Listeria monocytogenes NOT DETECTED NOT DETECTED Final   Staphylococcus  species NOT DETECTED NOT DETECTED Final   Staphylococcus aureus (BCID) NOT DETECTED NOT DETECTED Final   Staphylococcus epidermidis NOT DETECTED NOT DETECTED Final   Staphylococcus lugdunensis NOT DETECTED NOT DETECTED Final   Streptococcus species DETECTED (A) NOT DETECTED Final    Comment: CRITICAL RESULT CALLED TO, READ BACK BY AND VERIFIED WITH: L POINDEXTER,PHARMD@0450  06/30/21    Streptococcus agalactiae NOT DETECTED NOT DETECTED Final   Streptococcus pneumoniae NOT DETECTED NOT DETECTED Final   Streptococcus pyogenes DETECTED (A) NOT DETECTED Final    Comment: CRITICAL RESULT CALLED TO, READ BACK BY AND VERIFIED WITH: L POINDEXTER,PHARMD@0450  06/30/21    A.calcoaceticus-baumannii NOT DETECTED NOT DETECTED Final   Bacteroides fragilis NOT DETECTED NOT DETECTED Final   Enterobacterales NOT DETECTED NOT DETECTED Final   Enterobacter cloacae complex NOT DETECTED NOT DETECTED Final   Escherichia coli NOT DETECTED NOT DETECTED Final   Klebsiella aerogenes NOT DETECTED NOT DETECTED Final   Klebsiella oxytoca NOT DETECTED NOT DETECTED Final   Klebsiella pneumoniae NOT DETECTED NOT DETECTED Final   Proteus species NOT DETECTED NOT DETECTED Final   Salmonella species NOT DETECTED NOT DETECTED Final   Serratia marcescens NOT DETECTED NOT DETECTED Final   Haemophilus influenzae NOT DETECTED NOT DETECTED Final   Neisseria meningitidis NOT DETECTED NOT DETECTED Final   Pseudomonas aeruginosa NOT DETECTED NOT DETECTED Final   Stenotrophomonas maltophilia NOT DETECTED NOT DETECTED Final   Candida albicans NOT DETECTED NOT DETECTED Final   Candida auris NOT DETECTED NOT DETECTED Final   Candida glabrata NOT DETECTED NOT DETECTED Final   Candida krusei NOT DETECTED NOT DETECTED Final   Candida parapsilosis NOT DETECTED NOT DETECTED Final   Candida tropicalis NOT DETECTED NOT DETECTED Final   Cryptococcus neoformans/gattii NOT DETECTED NOT DETECTED Final    Comment: Performed at [facility], [address], [place], [place] [address]  Culture, blood (Routine x 2)     Status: Abnormal   Collection Time: 06/29/21 12:05 PM   Specimen: Neck; Blood  Result Value Ref Range Status   Specimen Description   Final    NECK BLOOD Performed at [facility], [address] [place], [place] [address]    Special Requests   Final    Blood Culture adequate volume BOTTLES DRAWN AEROBIC AND ANAEROBIC Performed at [facility], [place],  [place], [place] [address]    Culture  Setup Time   Final    GRAM POSITIVE COCCI IN CHAINS ANAEROBIC BOTTLE ONLY CRITICAL VALUE NOTED.  VALUE IS CONSISTENT WITH PREVIOUSLY REPORTED AND CALLED VALUE.    Culture (A)  Final    GROUP A STREP (S.PYOGENES) ISOLATED SUSCEPTIBILITIES PERFORMED ON PREVIOUS CULTURE WITHIN THE LAST 5 DAYS. Performed at [facility], [place] [address] [place], [place] [address]    Report Status 07/02/2021 FINAL  Final  MRSA Next Gen by PCR, Nasal     Status: None   Collection Time: 06/29/21  8:15 PM   Specimen: Nasal Mucosa; Nasal Swab  Result Value Ref Range Status   MRSA by PCR Next Gen NOT DETECTED NOT DETECTED Final    Comment: (NOTE) The GeneXpert MRSA Assay (FDA approved for NASAL specimens only), is one component of a comprehensive MRSA colonization surveillance program. It is not intended to diagnose MRSA infection nor to guide or monitor treatment for MRSA infections. Test performance is not FDA approved in patients less than 55 years old. Performed at [facility], [place] [address], [place], [place] [address]   C Difficile Quick Screen w PCR reflex     Status: None   Collection Time: 06/30/21  9:04 AM   Specimen: STOOL  Result Value Ref Range Status   C Diff antigen NEGATIVE NEGATIVE Final   C Diff toxin NEGATIVE NEGATIVE Final   C Diff interpretation No C. difficile detected.  Final    Comment: Performed at [facility], [place] [address] [place], [place] [address]  Gastrointestinal Panel by PCR , Stool     Status: Abnormal   Collection Time: 06/30/21  9:04 AM   Specimen: Stool  Result Value Ref Range Status   Campylobacter species NOT DETECTED NOT DETECTED Final   Plesimonas shigelloides NOT DETECTED NOT DETECTED Final   Salmonella species NOT DETECTED NOT DETECTED Final   Yersinia enterocolitica NOT DETECTED NOT DETECTED Final   Vibrio species NOT DETECTED NOT DETECTED Final   Vibrio cholerae NOT DETECTED NOT DETECTED Final   Enteroaggregative E coli (EAEC) NOT DETECTED NOT DETECTED Final   Enterotoxigenic E coli (ETEC) NOT DETECTED NOT DETECTED Final   Shiga like toxin producing E coli (STEC) DETECTED (A) NOT DETECTED Final    Comment: RESULT CALLED TO, READ BACK BY AND VERIFIED WITH: JAMIE DOMAN 04/30/22 1243 MU    E. coli O157 NOT DETECTED NOT DETECTED Final   Shigella/Enteroinvasive E coli (EIEC) NOT DETECTED NOT DETECTED Final   Cryptosporidium NOT DETECTED NOT DETECTED Final   Cyclospora cayetanensis NOT DETECTED NOT DETECTED Final   Entamoeba histolytica NOT DETECTED NOT DETECTED Final   Giardia lamblia NOT DETECTED NOT DETECTED Final   Adenovirus F40/41 NOT DETECTED NOT DETECTED Final   Astrovirus NOT DETECTED NOT DETECTED Final   Norovirus GI/GII NOT DETECTED NOT DETECTED Final   Rotavirus A NOT DETECTED NOT DETECTED Final   Sapovirus (I, II, IV, and V) NOT DETECTED NOT DETECTED Final    Comment: Performed at [facility], [address] [place], [place] [address]  Culture, blood (routine x 2)     Status: None (Preliminary result)   Collection Time: 06/30/21 12:02 PM   Specimen: BLOOD  Result Value Ref Range Status   Specimen Description   Final    BLOOD RIGHT ANTECUBITAL Performed at [facility], [place] [address] [place], [place] [address]    Special Requests  Final    BOTTLES DRAWN AEROBIC ONLY Blood Culture adequate volume Performed at [place] [address] [place], [place] [address]    Culture   Final    NO GROWTH 3 DAYS Performed at [facility], [place] [address] [place], [place] [address]    Report Status PENDING  Incomplete  Culture, blood (routine x 2)     Status: None (Preliminary result)   Collection Time: 06/30/21 12:02 PM   Specimen: BLOOD  Result Value Ref Range Status   Specimen Description   Final    BLOOD RIGHT ANTECUBITAL Performed at [place] [address], [place], [place] [address]    Special Requests   Final    BOTTLES DRAWN AEROBIC ONLY Blood Culture adequate volume Performed at [place] [address] [place], [place] [address]    Culture   Final    NO GROWTH 3 DAYS Performed at [facility], [place] [address] [place],  [address]    Report Status PENDING  Incomplete   *Note: Due to a large number of results and/or encounters for the requested time period, some results have not been displayed. A complete set of results can be found in Results Review.    Labs: CBC: Recent Labs  Lab 06/29/21 0951 06/30/21 0250 07/01/21 0236  WBC 16.3* 11.1* 6.1  NEUTROABS 14.4* 9.0* 3.9  HGB 10.2* 9.0* 8.8*  HCT 31.8* 28.5* 28.5*  MCV 89.3 91.3 95.6  PLT 64* 72* 63*   Basic Metabolic Panel: Recent Labs  Lab 06/29/21 0951 06/30/21 0250 07/01/21 0236 07/03/21 0800  NA 127* 136 131*  --   K 3.9 3.9 3.1* 3.3*  CL 98 107 107  --   CO2 19* 23 18*  --   GLUCOSE 101* 103* 86  --   BUN 27* 24* 20  --   CREATININE 1.83* 1.44* 1.45*  --   CALCIUM 7.7* 7.5* 7.4*  --   MG  --  1.8 1.7  --   PHOS  --   --  3.1  --    Liver Function Tests: Recent Labs  Lab 06/29/21 0951 06/30/21 0250  AST 42* 28  ALT 23 16  ALKPHOS 145* 103  BILITOT 1.1 0.8  PROT 8.0 6.7  ALBUMIN 2.8* 2.2*   CBG: Recent Labs  Lab 06/29/21 0952  GLUCAP 97    Discharge time spent: 35 minutes.  Signed: Antonieta Pert, MD [facility] 07/03/2021

## 2021-07-03 NOTE — Progress Notes (Signed)
[facility] for Infectious Disease   Reason for visit: Follow up on cellulitis  Interval History: line changed over wire; single lumen in place; remains afebrile Day 5 total antibiotics  Physical Exam: Constitutional:  Vitals:   07/03/21 0803 07/03/21 0900  BP: (!) 114/45 (!) 106/56  Pulse: 63 (!) 59  Resp: 15 (!) 23  Temp:    SpO2: 100% 99%   patient appears in NAD Respiratory: Normal respiratory effort MS: left arm with decreased erythema, decreased warmth  Review of Systems: Constitutional: negative for fevers and chills  Lab Results  Component Value Date   WBC 6.1 07/01/2021   HGB 8.8 (L) 07/01/2021   HCT 28.5 (L) 07/01/2021   MCV 95.6 07/01/2021   PLT 63 (L) 07/01/2021    Lab Results  Component Value Date   CREATININE 1.45 (H) 07/01/2021   BUN 20 07/01/2021   NA 131 (L) 07/01/2021   K 3.3 (L) 07/03/2021   CL 107 07/01/2021   CO2 18 (L) 07/01/2021    Lab Results  Component Value Date   ALT 16 06/30/2021   AST 28 06/30/2021   ALKPHOS 103 06/30/2021     Microbiology: Recent Results (from the past 240 hour(s))  Culture, blood (Routine x 2)     Status: Abnormal   Collection Time: 06/29/21  9:51 AM   Specimen: BLOOD RIGHT HAND  Result Value Ref Range Status   Specimen Description   Final    BLOOD RIGHT HAND BLOOD Performed at [facility], [address] [place], [place] [address]    Special Requests   Final    Blood Culture adequate volume BOTTLES DRAWN AEROBIC AND ANAEROBIC Performed at [facility], [address] [place], [place] [address]    Culture  Setup Time   Final    GRAM POSITIVE COCCI IN CHAINS IN BOTH AEROBIC AND ANAEROBIC BOTTLES CRITICAL RESULT CALLED TO, READ BACK BY AND VERIFIED WITH: L POINDEXTER,PHARMD@[id number]  06/30/21 [place]    Culture (A)  Final    GROUP A STREP (S.PYOGENES) ISOLATED HEALTH DEPARTMENT NOTIFIED Performed at [facility], [address], [place], [place] [address]    Report Status  07/02/2021 FINAL  Final   Organism ID, Bacteria GROUP A STREP (S.PYOGENES) ISOLATED  Final      Susceptibility   Group a strep (s.pyogenes) isolated - MIC*    PENICILLIN <=0.06 SENSITIVE Sensitive     CEFTRIAXONE <=0.12 SENSITIVE Sensitive     ERYTHROMYCIN <=0.12 SENSITIVE Sensitive     LEVOFLOXACIN 0.5 SENSITIVE Sensitive     VANCOMYCIN 0.5 SENSITIVE Sensitive     * GROUP A STREP (S.PYOGENES) ISOLATED  Resp Panel by RT-PCR (Flu A&B, Covid) Nasopharyngeal Swab     Status: None   Collection Time: 06/29/21  9:51 AM   Specimen: Nasopharyngeal Swab; Nasopharyngeal(NP) swabs in vial transport medium  Result Value Ref Range Status   SARS Coronavirus 2 by RT PCR NEGATIVE NEGATIVE Final    Comment: (NOTE) SARS-CoV-2 target nucleic acids are NOT DETECTED.  The SARS-CoV-2 RNA is generally detectable in upper respiratory specimens during the acute phase of infection. The lowest concentration of SARS-CoV-2 viral copies this assay can detect is 138 copies/mL. A negative result does not preclude SARS-Cov-2 infection and should not be used as the sole basis for treatment or other patient management decisions. A negative result may occur with  improper specimen collection/handling, submission of specimen other than nasopharyngeal swab, presence of viral mutation(s)  within the areas targeted by this assay, and inadequate number of viral copies(<138 copies/mL). A negative result must be combined with clinical observations, patient history, and epidemiological information. The expected result is Negative.  Fact Sheet for Patients:  EntrepreneurPulse.com.au  Fact Sheet for Healthcare Providers:  IncredibleEmployment.be  This test is no t yet approved or cleared by the [place] FDA and  has been authorized for detection and/or diagnosis of SARS-CoV-2 by FDA under an Emergency Use Authorization (EUA). This EUA will remain  in effect (meaning this test can be  used) for the duration of the COVID-19 declaration under Section 564(b)(1) of the Act, 21 U.S.C.section 360bbb-3(b)(1), unless the authorization is terminated  or revoked sooner.       Influenza A by PCR NEGATIVE NEGATIVE Final   Influenza B by PCR NEGATIVE NEGATIVE Final    Comment: (NOTE) The Xpert Xpress SARS-CoV-2/FLU/RSV plus assay is intended as an aid in the diagnosis of influenza from Nasopharyngeal swab specimens and should not be used as a sole basis for treatment. Nasal washings and aspirates are unacceptable for Xpert Xpress SARS-CoV-2/FLU/RSV testing.  Fact Sheet for Patients: EntrepreneurPulse.com.au  Fact Sheet for Healthcare Providers: IncredibleEmployment.be  This test is not yet approved or cleared by the [place] FDA and has been authorized for detection and/or diagnosis of SARS-CoV-2 by FDA under an Emergency Use Authorization (EUA). This EUA will remain in effect (meaning this test can be used) for the duration of the COVID-19 declaration under Section 564(b)(1) of the Act, 21 U.S.C. section 360bbb-3(b)(1), unless the authorization is terminated or revoked.  Performed at [facility], [address] [place], [place] [address]   Blood Culture ID Panel (Reflexed)     Status: Abnormal   Collection Time: 06/29/21  9:51 AM  Result Value Ref Range Status   Enterococcus faecalis NOT DETECTED NOT DETECTED Final   Enterococcus Faecium NOT DETECTED NOT DETECTED Final   Listeria monocytogenes NOT DETECTED NOT DETECTED Final   Staphylococcus species NOT DETECTED NOT DETECTED Final   Staphylococcus aureus (BCID) NOT DETECTED NOT DETECTED Final   Staphylococcus epidermidis NOT DETECTED NOT DETECTED Final   Staphylococcus lugdunensis NOT DETECTED NOT DETECTED Final   Streptococcus species DETECTED (A) NOT DETECTED Final    Comment: CRITICAL RESULT CALLED TO, READ BACK BY AND VERIFIED WITH: L POINDEXTER,PHARMD@0450   06/30/21    Streptococcus agalactiae NOT DETECTED NOT DETECTED Final   Streptococcus pneumoniae NOT DETECTED NOT DETECTED Final   Streptococcus pyogenes DETECTED (A) NOT DETECTED Final    Comment: CRITICAL RESULT CALLED TO, READ BACK BY AND VERIFIED WITH: L POINDEXTER,PHARMD@0450  06/30/21    A.calcoaceticus-baumannii NOT DETECTED NOT DETECTED Final   Bacteroides fragilis NOT DETECTED NOT DETECTED Final   Enterobacterales NOT DETECTED NOT DETECTED Final   Enterobacter cloacae complex NOT DETECTED NOT DETECTED Final   Escherichia coli NOT DETECTED NOT DETECTED Final   Klebsiella aerogenes NOT DETECTED NOT DETECTED Final   Klebsiella oxytoca NOT DETECTED NOT DETECTED Final   Klebsiella pneumoniae NOT DETECTED NOT DETECTED Final   Proteus species NOT DETECTED NOT DETECTED Final   Salmonella species NOT DETECTED NOT DETECTED Final   Serratia marcescens NOT DETECTED NOT DETECTED Final   Haemophilus influenzae NOT DETECTED NOT DETECTED Final   Neisseria meningitidis NOT DETECTED NOT DETECTED Final   Pseudomonas aeruginosa NOT DETECTED NOT DETECTED Final   Stenotrophomonas maltophilia NOT DETECTED NOT DETECTED Final   Candida albicans NOT DETECTED NOT DETECTED Final   Candida auris NOT DETECTED NOT  DETECTED Final   Candida glabrata NOT DETECTED NOT DETECTED Final   Candida krusei NOT DETECTED NOT DETECTED Final   Candida parapsilosis NOT DETECTED NOT DETECTED Final   Candida tropicalis NOT DETECTED NOT DETECTED Final   Cryptococcus neoformans/gattii NOT DETECTED NOT DETECTED Final    Comment: Performed at [facility], [place] [address], [place], [place] [address]  Culture, blood (Routine x 2)     Status: Abnormal   Collection Time: 06/29/21 12:05 PM   Specimen: Neck; Blood  Result Value Ref Range Status   Specimen Description   Final    NECK BLOOD Performed at [facility], [address] [place], [place] [address]    Special Requests   Final    Blood Culture adequate  volume BOTTLES DRAWN AEROBIC AND ANAEROBIC Performed at [facility], [address] [place], [place] [address]    Culture  Setup Time   Final    GRAM POSITIVE COCCI IN CHAINS ANAEROBIC BOTTLE ONLY CRITICAL VALUE NOTED.  VALUE IS CONSISTENT WITH PREVIOUSLY REPORTED AND CALLED VALUE.    Culture (A)  Final    GROUP A STREP (S.PYOGENES) ISOLATED SUSCEPTIBILITIES PERFORMED ON PREVIOUS CULTURE WITHIN THE LAST 5 DAYS. Performed at [facility], [place] [address], [place], [place] [address]    Report Status 07/02/2021 FINAL  Final  MRSA Next Gen by PCR, Nasal     Status: None   Collection Time: 06/29/21  8:15 PM   Specimen: Nasal Mucosa; Nasal Swab  Result Value Ref Range Status   MRSA by PCR Next Gen NOT DETECTED NOT DETECTED Final    Comment: (NOTE) The GeneXpert MRSA Assay (FDA approved for NASAL specimens only), is one component of a comprehensive MRSA colonization surveillance program. It is not intended to diagnose MRSA infection nor to guide or monitor treatment for MRSA infections. Test performance is not FDA approved in patients less than 40 years old. Performed at [facility], [place] [address] [place], [place] [address]   C Difficile Quick Screen w PCR reflex     Status: None   Collection Time: 06/30/21  9:04 AM   Specimen: STOOL  Result Value Ref Range Status   C Diff antigen NEGATIVE NEGATIVE Final   C Diff toxin NEGATIVE NEGATIVE Final   C Diff interpretation No C. difficile detected.  Final    Comment: Performed at [facility], [place] [address] [place], [place] [address]  Gastrointestinal Panel by PCR , Stool     Status: Abnormal   Collection Time: 06/30/21  9:04 AM   Specimen: Stool  Result Value Ref Range Status   Campylobacter species NOT DETECTED NOT DETECTED Final   Plesimonas shigelloides NOT DETECTED NOT DETECTED Final   Salmonella species NOT DETECTED NOT DETECTED Final   Yersinia enterocolitica NOT  DETECTED NOT DETECTED Final   Vibrio species NOT DETECTED NOT DETECTED Final   Vibrio cholerae NOT DETECTED NOT DETECTED Final   Enteroaggregative E coli (EAEC) NOT DETECTED NOT DETECTED Final   Enterotoxigenic E coli (ETEC) NOT DETECTED NOT DETECTED Final   Shiga like toxin producing E coli (STEC) DETECTED (A) NOT DETECTED Final    Comment: RESULT CALLED TO, READ BACK BY AND VERIFIED WITH: JAMIE [facility] 04/30/22 1243 MU    E. coli O157 NOT DETECTED NOT DETECTED Final   Shigella/Enteroinvasive E coli (EIEC) NOT DETECTED NOT DETECTED Final   Cryptosporidium NOT DETECTED NOT DETECTED Final   Cyclospora  cayetanensis NOT DETECTED NOT DETECTED Final   Entamoeba histolytica NOT DETECTED NOT DETECTED Final   Giardia lamblia NOT DETECTED NOT DETECTED Final   Adenovirus F40/41 NOT DETECTED NOT DETECTED Final   Astrovirus NOT DETECTED NOT DETECTED Final   Norovirus GI/GII NOT DETECTED NOT DETECTED Final   Rotavirus A NOT DETECTED NOT DETECTED Final   Sapovirus (I, II, IV, and V) NOT DETECTED NOT DETECTED Final    Comment: Performed at [facility], [address] [place], [place] [address]  Culture, blood (routine x 2)     Status: None (Preliminary result)   Collection Time: 06/30/21 12:02 PM   Specimen: BLOOD  Result Value Ref Range Status   Specimen Description   Final    BLOOD RIGHT ANTECUBITAL Performed at [facility], [place] [address], [place], [place] [address]    Special Requests   Final    BOTTLES DRAWN AEROBIC ONLY Blood Culture adequate volume Performed at [place] [address] [place], [place] [address]    Culture   Final    NO GROWTH 3 DAYS Performed at [facility], [place] [address] [place], [place] [address]    Report Status PENDING  Incomplete  Culture, blood (routine x 2)     Status: None (Preliminary result)   Collection Time: 06/30/21 12:02 PM   Specimen: BLOOD  Result Value Ref Range Status   Specimen  Description   Final    BLOOD RIGHT ANTECUBITAL Performed at [place] [address], [place], [place] [address]    Special Requests   Final    BOTTLES DRAWN AEROBIC ONLY Blood Culture adequate volume Performed at [place] [address], [place], [place] [address]    Culture   Final    NO GROWTH 3 DAYS Performed at [facility], [place] [address], [place], [place] [address]    Report Status PENDING  Incomplete    Impression/Plan:  1. Cellulitis - improved on treatment with penicillin.  Transitioning to cefazolin for ease of administration with one lumen and TPN needs.    2.  Access - difficult access and line changed over wire rather than line [place] due to this difficulty.  Will plan on IV antibiotics as above with bacteremia with group A Strep.    3.  OPAT: Diagnosis: Cellulitis, bacteremia  Culture Result: group A Strep  No Active Allergies  OPAT Orders Discharge antibiotics to be given via PICC line Discharge antibiotics:cefazolin 2 grams every 8 hours Per pharmacy protocol yes Duration: 6 days End Date: 07/09/21  [facility] Per Protocol: yes  Home health RN for IV administration and teaching; PICC line care and labs.    Labs weekly while on IV antibiotics: _x_ CBC with differential __ BMP _x_ CMP __ CRP __ ESR __ Vancomycin trough __ CK  __ Please pull PIC at completion of IV antibiotics __ Please leave PIC in place until doctor has seen patient or been notified Picc line remains for chronic TPN needs  Fax weekly labs to [phone]  Clinic Follow Up Appt: Can follow up as needed

## 2021-07-03 NOTE — TOC Transition Note (Signed)
Transition of Care [facility]) - CM/SW Discharge Note   Patient Details  Name: Samika C Hilbun MRN: [id number] Date of Birth: 01/01/1951  [facility]) CM/SW Contact:  Trish Mage, LCSW Phone Number: 07/03/2021, 11:14 AM   Clinical Narrative:   CSW was contacted for help with IV abx.  Ms Riner is already open to [facility] out of [place] for TPN.  Spoke with Merrilee Seashore the pharmacist [phone] who confirmed need for MAR, orders and IR note.  Jimmy Footman, [facility] is getting order form signed.  I FAXed other requested items to [phone]. TOC will continue to follow during the course of hospitalization.     Final next level of care: Home w Home Health Services Barriers to Discharge: Other (must enter comment) (Finalizing IV abx plan)   Patient Goals and CMS Choice        Discharge Placement                       Discharge Plan and Services                                     Social Determinants of Health (SDOH) Interventions     Readmission Risk Interventions No flowsheet data found.

## 2021-07-03 NOTE — Clinical Social Work Note (Signed)
°  Transition of Care [facility]) Screening Note   Patient Details  Name: [name] [name] Date of Birth: 1950-12-15   [facility]) CM/SW Contact:    Trish Mage, LCSW Phone Number: 07/03/2021, 8:31 AM    Transition of Care Department [facility]) has reviewed patient and no TOC needs have been identified at this time. We will continue to monitor patient advancement through interdisciplinary progression rounds. If new patient transition needs arise, please place a TOC consult.

## 2021-07-03 NOTE — Telephone Encounter (Signed)
This encounter was created in error - please disregard.

## 2021-07-04 ENCOUNTER — Other Ambulatory Visit: Payer: Self-pay

## 2021-07-04 ENCOUNTER — Ambulatory Visit (INDEPENDENT_AMBULATORY_CARE_PROVIDER_SITE_OTHER): Payer: Medicare Other

## 2021-07-04 ENCOUNTER — Telehealth: Payer: Self-pay

## 2021-07-04 DIAGNOSIS — L03114 Cellulitis of left upper limb: Secondary | ICD-10-CM

## 2021-07-04 MED ORDER — CEFTRIAXONE SODIUM 500 MG IJ SOLR
500.0000 mg | Freq: Once | INTRAMUSCULAR | Status: AC
  Administered 2021-07-04: 500 mg via INTRAMUSCULAR

## 2021-07-04 NOTE — Telephone Encounter (Signed)
Patient called stating she has not received her IV Abx from [facility] in [place]. Advised I will research this and call her back.   I called Merrilee Seashore with [facility]. He states [employer] has informed they can not even expedite this or TPN medication (Nutrition) and patient should be readmitted as any TPN she has on hand is expired. I have notified Dr Linus Salmons. I did advise Merrilee Seashore to alert [place] once meds are in route. Expected 3 more days as [employer] stated they are on 2-3 day delay and did not give reason. Will await plan from Dr Linus Salmons.   Patient can be reached at [facility]) [phone]

## 2021-07-04 NOTE — Telephone Encounter (Signed)
Patient agreeable to come in today for nurse visit of 1 gram ceftriaxone. Patient scheduled at 2:30 pm.   Tomi Bamberger, CMA

## 2021-07-05 ENCOUNTER — Ambulatory Visit: Payer: Medicare Other | Admitting: Internal Medicine

## 2021-07-05 LAB — CULTURE, BLOOD (ROUTINE X 2)
Culture: NO GROWTH
Culture: NO GROWTH
Special Requests: ADEQUATE
Special Requests: ADEQUATE

## 2021-07-10 DIAGNOSIS — H25013 Cortical age-related cataract, bilateral: Secondary | ICD-10-CM | POA: Diagnosis not present

## 2021-07-10 DIAGNOSIS — H2513 Age-related nuclear cataract, bilateral: Secondary | ICD-10-CM | POA: Diagnosis not present

## 2021-07-10 DIAGNOSIS — H25043 Posterior subcapsular polar age-related cataract, bilateral: Secondary | ICD-10-CM | POA: Diagnosis not present

## 2021-07-12 ENCOUNTER — Inpatient Hospital Stay: Payer: Medicare Other

## 2021-07-12 ENCOUNTER — Inpatient Hospital Stay: Payer: Medicare Other | Attending: Hematology and Oncology

## 2021-07-12 ENCOUNTER — Other Ambulatory Visit: Payer: Self-pay

## 2021-07-12 VITALS — BP 120/73 | HR 62 | Temp 98.3°F | Resp 17

## 2021-07-12 DIAGNOSIS — D638 Anemia in other chronic diseases classified elsewhere: Secondary | ICD-10-CM

## 2021-07-12 DIAGNOSIS — D696 Thrombocytopenia, unspecified: Secondary | ICD-10-CM | POA: Diagnosis not present

## 2021-07-12 DIAGNOSIS — N1831 Chronic kidney disease, stage 3a: Secondary | ICD-10-CM | POA: Diagnosis not present

## 2021-07-12 DIAGNOSIS — D631 Anemia in chronic kidney disease: Secondary | ICD-10-CM | POA: Diagnosis not present

## 2021-07-12 DIAGNOSIS — K912 Postsurgical malabsorption, not elsewhere classified: Secondary | ICD-10-CM | POA: Insufficient documentation

## 2021-07-12 DIAGNOSIS — D508 Other iron deficiency anemias: Secondary | ICD-10-CM

## 2021-07-12 LAB — CBC WITH DIFFERENTIAL (CANCER CENTER ONLY)
Abs Immature Granulocytes: 0.01 10*3/uL (ref 0.00–0.07)
Basophils Absolute: 0.1 10*3/uL (ref 0.0–0.1)
Basophils Relative: 1 %
Eosinophils Absolute: 0.4 10*3/uL (ref 0.0–0.5)
Eosinophils Relative: 8 %
HCT: 29.3 % — ABNORMAL LOW (ref 36.0–46.0)
Hemoglobin: 9.3 g/dL — ABNORMAL LOW (ref 12.0–15.0)
Immature Granulocytes: 0 %
Lymphocytes Relative: 34 %
Lymphs Abs: 1.8 10*3/uL (ref 0.7–4.0)
MCH: 29.1 pg (ref 26.0–34.0)
MCHC: 31.7 g/dL (ref 30.0–36.0)
MCV: 91.6 fL (ref 80.0–100.0)
Monocytes Absolute: 0.4 10*3/uL (ref 0.1–1.0)
Monocytes Relative: 8 %
Neutro Abs: 2.5 10*3/uL (ref 1.7–7.7)
Neutrophils Relative %: 49 %
Platelet Count: 81 10*3/uL — ABNORMAL LOW (ref 150–400)
RBC: 3.2 MIL/uL — ABNORMAL LOW (ref 3.87–5.11)
RDW: 17.4 % — ABNORMAL HIGH (ref 11.5–15.5)
WBC Count: 5.2 10*3/uL (ref 4.0–10.5)
nRBC: 0 % (ref 0.0–0.2)

## 2021-07-12 LAB — CMP (CANCER CENTER ONLY)
ALT: 6 U/L (ref 0–44)
AST: 37 U/L (ref 15–41)
Albumin: 2.9 g/dL — ABNORMAL LOW (ref 3.5–5.0)
Alkaline Phosphatase: 155 U/L — ABNORMAL HIGH (ref 38–126)
Anion gap: 5 (ref 5–15)
BUN: 21 mg/dL (ref 8–23)
CO2: 22 mmol/L (ref 22–32)
Calcium: 8.4 mg/dL — ABNORMAL LOW (ref 8.9–10.3)
Chloride: 110 mmol/L (ref 98–111)
Creatinine: 1.29 mg/dL — ABNORMAL HIGH (ref 0.44–1.00)
GFR, Estimated: 44 mL/min — ABNORMAL LOW (ref >60)
Glucose, Bld: 77 mg/dL (ref 70–99)
Potassium: 4 mmol/L (ref 3.5–5.1)
Sodium: 137 mmol/L (ref 135–145)
Total Bilirubin: 0.4 mg/dL (ref 0.3–1.2)
Total Protein: 7.9 g/dL (ref 6.5–8.1)

## 2021-07-12 LAB — IRON AND IRON BINDING CAPACITY (CC-WL,HP ONLY)
Iron: 40 ug/dL (ref 28–170)
Saturation Ratios: 13 % (ref 10.4–31.8)
TIBC: 309 ug/dL (ref 250–450)
UIBC: 269 ug/dL (ref 148–442)

## 2021-07-12 LAB — FERRITIN: Ferritin: 115 ng/mL (ref 11–307)

## 2021-07-12 MED ORDER — DARBEPOETIN ALFA 300 MCG/0.6ML IJ SOSY
300.0000 ug | PREFILLED_SYRINGE | Freq: Once | INTRAMUSCULAR | Status: AC
  Administered 2021-07-12: 300 ug via SUBCUTANEOUS
  Filled 2021-07-12: qty 0.6

## 2021-07-22 ENCOUNTER — Other Ambulatory Visit (INDEPENDENT_AMBULATORY_CARE_PROVIDER_SITE_OTHER): Payer: Medicare Other

## 2021-07-22 DIAGNOSIS — K912 Postsurgical malabsorption, not elsewhere classified: Secondary | ICD-10-CM | POA: Diagnosis not present

## 2021-07-22 DIAGNOSIS — D649 Anemia, unspecified: Secondary | ICD-10-CM

## 2021-07-22 DIAGNOSIS — K760 Fatty (change of) liver, not elsewhere classified: Secondary | ICD-10-CM

## 2021-07-22 LAB — CBC WITH DIFFERENTIAL/PLATELET
Basophils Absolute: 0.1 10*3/uL (ref 0.0–0.1)
Basophils Relative: 1.4 % (ref 0.0–3.0)
Eosinophils Absolute: 0.4 10*3/uL (ref 0.0–0.7)
Eosinophils Relative: 10.2 % — ABNORMAL HIGH (ref 0.0–5.0)
HCT: 31.7 % — ABNORMAL LOW (ref 36.0–46.0)
Hemoglobin: 10 g/dL — ABNORMAL LOW (ref 12.0–15.0)
Lymphocytes Relative: 36.5 % (ref 12.0–46.0)
Lymphs Abs: 1.5 10*3/uL (ref 0.7–4.0)
MCHC: 31.7 g/dL (ref 30.0–36.0)
MCV: 91.7 fl (ref 78.0–100.0)
Monocytes Absolute: 0.5 10*3/uL (ref 0.1–1.0)
Monocytes Relative: 12.5 % — ABNORMAL HIGH (ref 3.0–12.0)
Neutro Abs: 1.6 10*3/uL (ref 1.4–7.7)
Neutrophils Relative %: 39.4 % — ABNORMAL LOW (ref 43.0–77.0)
Platelets: 83 10*3/uL — ABNORMAL LOW (ref 150.0–400.0)
RBC: 3.45 Mil/uL — ABNORMAL LOW (ref 3.87–5.11)
RDW: 18.6 % — ABNORMAL HIGH (ref 11.5–15.5)
WBC: 4.2 10*3/uL (ref 4.0–10.5)

## 2021-07-22 LAB — COMPREHENSIVE METABOLIC PANEL
ALT: 11 U/L (ref 0–35)
AST: 29 U/L (ref 0–37)
Albumin: 3.2 g/dL — ABNORMAL LOW (ref 3.5–5.2)
Alkaline Phosphatase: 157 U/L — ABNORMAL HIGH (ref 39–117)
BUN: 27 mg/dL — ABNORMAL HIGH (ref 6–23)
CO2: 24 mEq/L (ref 19–32)
Calcium: 8.2 mg/dL — ABNORMAL LOW (ref 8.4–10.5)
Chloride: 104 mEq/L (ref 96–112)
Creatinine, Ser: 1.15 mg/dL (ref 0.40–1.20)
GFR: 48.03 mL/min — ABNORMAL LOW (ref >60.00)
Glucose, Bld: 113 mg/dL — ABNORMAL HIGH (ref 70–99)
Potassium: 4.1 mEq/L (ref 3.5–5.1)
Sodium: 134 mEq/L — ABNORMAL LOW (ref 135–145)
Total Bilirubin: 0.4 mg/dL (ref 0.2–1.2)
Total Protein: 7.7 g/dL (ref 6.0–8.3)

## 2021-07-22 LAB — TRIGLYCERIDES: Triglycerides: 96 mg/dL (ref 0.0–149.0)

## 2021-07-22 LAB — MAGNESIUM: Magnesium: 1.7 mg/dL (ref 1.5–2.5)

## 2021-07-22 LAB — PHOSPHORUS: Phosphorus: 3.3 mg/dL (ref 2.3–4.6)

## 2021-07-23 LAB — PREALBUMIN: Prealbumin: 18 mg/dL (ref 17–34)

## 2021-07-24 ENCOUNTER — Encounter: Payer: Self-pay | Admitting: Internal Medicine

## 2021-07-24 ENCOUNTER — Other Ambulatory Visit (HOSPITAL_COMMUNITY): Payer: Self-pay | Admitting: Internal Medicine

## 2021-07-24 ENCOUNTER — Telehealth: Payer: Self-pay

## 2021-07-24 ENCOUNTER — Ambulatory Visit (INDEPENDENT_AMBULATORY_CARE_PROVIDER_SITE_OTHER): Payer: Medicare Other | Admitting: Internal Medicine

## 2021-07-24 VITALS — BP 114/60 | HR 53 | Ht 68.0 in | Wt 150.4 lb

## 2021-07-24 DIAGNOSIS — D649 Anemia, unspecified: Secondary | ICD-10-CM

## 2021-07-24 DIAGNOSIS — Z7901 Long term (current) use of anticoagulants: Secondary | ICD-10-CM

## 2021-07-24 DIAGNOSIS — Z86010 Personal history of colonic polyps: Secondary | ICD-10-CM | POA: Diagnosis not present

## 2021-07-24 DIAGNOSIS — R161 Splenomegaly, not elsewhere classified: Secondary | ICD-10-CM

## 2021-07-24 DIAGNOSIS — K760 Fatty (change of) liver, not elsewhere classified: Secondary | ICD-10-CM | POA: Diagnosis not present

## 2021-07-24 DIAGNOSIS — D696 Thrombocytopenia, unspecified: Secondary | ICD-10-CM | POA: Diagnosis not present

## 2021-07-24 DIAGNOSIS — K912 Postsurgical malabsorption, not elsewhere classified: Secondary | ICD-10-CM | POA: Diagnosis not present

## 2021-07-24 NOTE — Telephone Encounter (Signed)
Xarelto is being prescribed by Dr. 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  He also has a history of CVA/TIA.  He is on Xarelto for secondary stroke prevention. ? ? ?We will defer management of Xarelto to PCP. Will remove from cardiology preop pool ?

## 2021-07-24 NOTE — Patient Instructions (Signed)
You have been scheduled for an endoscopy and colonoscopy. Please follow the written instructions given to you at your visit today. ?Please pick up your prep supplies at the pharmacy within the next 1-3 days. ?If you use inhalers (even only as needed), please bring them with you on the day of your procedure. ? ? ?You will be contaced by our office prior to your procedure for directions on holding your Xarelto. If you do not hear from our office 1 week prior to your scheduled procedure, please call [phone] to discuss. ? ? ?I appreciate the opportunity to care for you. ?Silvano Rusk, MD, [facility] ?

## 2021-07-24 NOTE — Progress Notes (Signed)
[name] [name] 71 y.o. 1950-10-05 [id number]  Assessment & Plan:   Encounter Diagnoses  Name Primary?   Normocytic anemia Yes   Hx of adenomatous colonic polyps    Chronic anticoagulation    Acquired short bowel syndrome    Thrombocytopenia (HCC)    NAFLD (nonalcoholic fatty liver disease)    Splenomegaly     Her hemoglobin is improved.  Given the issues with a history of colon polyps and Gattex treatment which increases the risk of polyps and perhaps colon cancer as well as her anemia from before I do think we should proceed with an EGD and colonoscopy.  We will wait for her cataract surgery to be completed in a.m. for May.  As best I can tell she is an appropriate candidate to have this done in the [facility].   She has NAFLD which she is at risk of related to her short-bowel and TPN.  She has not had signs of cirrhosis though she continues to have thrombocytopenia and does have splenomegaly.  EGD will help sort out if there is portal hypertension but will be perfect.  Consider repeating an abdominal ultrasound (last done May 2022) pending the above.  She has a lot going on and I do not want to overwhelm her.   I will refer her to the new TPN company.  This sounds like a good move for her based upon our discussion today.   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  She was seen at the end of 2022 and we considered EGD and colonoscopy to evaluate decline in hemoglobin and history of colon polyps.  She was having some dyspnea that I think was  related to her anemia.  She was evaluated by Dr. Augustina [name] of the CHF clinic in late January and her EF was 35 to 40% on echocardiogram and per personal communication he thought it was okay for her to proceed with procedures.  She unfortunately developed a cellulitis of her left upper extremity and required a hospitalization in February.  She is getting iron parenteral treatment through hematology.  She feels better.  Off antibiotics.  Her TPN provider has changed ownership in the last couple of years and she is thinking of changing to a different company.  She has lost funding for her Gattex.  There is a new company she is looking at and I have received an email on this as well.  She is due to have surgery for cataracts at the end of March and again in [name] [name].  She is not having significant or new dyspnea problems.  Wt Readings from Last 3 Encounters:  07/24/21 150 lb 6.4 oz (68.2 kg)  07/03/21 159 lb 2.8 oz (72.2 kg)  06/21/21 154 lb 8 oz (70.1 kg)   CBC Latest Ref Rng & Units 07/22/2021 07/12/2021 07/01/2021  WBC 4.0 - 10.5 K/uL 4.2 5.2 6.1  Hemoglobin 12.0 - 15.0 g/dL 10.0(L) 9.3(L) 8.8(L)  Hematocrit 36.0 - 46.0 % 31.7(L) 29.3(L) 28.5(L)  Platelets 150.0 - 400.0 K/uL 83.0(L) 81(L) 63(L)   Lab Results  Component Value Date   FERRITIN 115 07/12/2021  No Known Allergies Current Meds  Medication Sig   acetaminophen (TYLENOL) 500 MG tablet Take 1,000 mg by mouth daily as needed for mild pain.   ADULT TPN Inject 1,800 mLs into the vein 4 (four) times a week. Pt receives home TPN from [facility]:  1800 mL bag, four nights weekly (Monday, Tuesday, Wednesday, Thursday for 8 hours (includes 1 hour taper up and down).   baclofen (LIORESAL) 10 MG tablet Take 1 tablet (10 mg total) by mouth 2 (two) times daily as needed for muscle spasms.   Calcium Carb-Cholecalciferol (CALCIUM + D3 PO) Take 2 tablets by mouth daily.   cetirizine (ZYRTEC) 10 MG tablet Take 1 tablet (10 mg total) by mouth at  bedtime as needed for allergies.   clobetasol ointment (TEMOVATE) 0.05 % Apply 1 application topically 2 (two) times daily.   diphenhydrAMINE (BENADRYL) 25 mg capsule Take 25-50 mg by mouth every 6 (six) hours as needed for itching or sleep.    empagliflozin (JARDIANCE) 10 MG TABS tablet Take 1 tablet (10 mg total) by mouth daily before breakfast.   fluticasone (FLONASE) 50 MCG/ACT nasal spray Place 2 sprays into both nostrils daily as needed for allergies or rhinitis.   furosemide (LASIX) 20 MG tablet TAKE ONE TABLET BY MOUTH AS NEEDED FOR SWELLING   Heparin Sodium, Porcine, (HEPARIN LOCK FLUSH IJ) Inject 5 mLs as directed 4 (four) times a week. Monday, Tuesday, Wednesday, Thursday in the morning with TPN   losartan (COZAAR) 25 MG tablet Take 0.5 tablets (12.5 mg total) by mouth at bedtime.   meclizine (ANTIVERT) 25 MG tablet Take 1 tablet (25 mg total) by mouth 3 (three) times daily as needed for dizziness.   Multiple Vitamins-Minerals (ADULT GUMMY PO) Take 2 tablets by mouth daily.   omeprazole (PRILOSEC) 40 MG capsule TAKE 1 CAPSULE BY MOUTH EVERY DAY BEFORE BREAKFAST   ondansetron (ZOFRAN-ODT) 8 MG disintegrating tablet TAKE 1 TABLET (8 MG TOTAL) BY MOUTH EVERY 8 (EIGHT) HOURS AS NEEDED FOR NAUSEA OR VOMITING.   sodium chloride 0.9 % infusion Inject 900 mLs into the vein every 14 (fourteen) days. Uses for TPN   spironolactone (ALDACTONE) 25 MG tablet Take 0.5 tablets (12.5 mg total) by mouth daily.   Teduglutide, rDNA, 5 MG KIT Inject 3.1 application into the skin every morning.   triamcinolone cream (KENALOG) 0.1 % Apply 1 application topically as needed (itching).   vitamin E 1000 UNIT capsule Take 1,000 Units by mouth daily.   XARELTO 20 MG TABS tablet TAKE 1 TABLET (20 MG TOTAL) BY MOUTH DAILY WITH SUPPER.   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   Fever 10/29/2020   Fracture of left clavicle    History of blood transfusion 2013   anemia   Hx of cardiovascular stress test    Myoview (9/15):  inf-apical scar; no ischemia; EF 47% - low risk    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 12/12/2011   Osteoporosis    Pancytopenia 10/07/2011   Pathologic fracture of neck of femur (HCC)    Personal history of colonic polyps    Renal insufficiency    hx of yrs ago   Serratia marcescens infection - bactermia assoc w/ PICC 01/18/2015   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HCC)    Splenomegaly    By ultrasound   Stroke (HCC) 07/2020   Thrombophilia (HCC)    Vitamin D deficiency    Past Surgical History:  Procedure Laterality Date   APPENDECTOMY  yrs ago   CHOLECYSTECTOMY  yrs ago   COLONOSCOPY  12/05/2005   internal hemorrhoids (for polyp surveillance)   COLONOSCOPY  04/26/2012   Procedure: COLONOSCOPY;  Surgeon: Iva Boop, MD;  Location: [facility];  Service: Endoscopy;  Laterality: N/A;   COLONOSCOPY WITH PROPOFOL N/A 03/18/2016   Procedure: COLONOSCOPY WITH PROPOFOL;  Surgeon: Iva Boop, MD;  Location: [facility];  Service: Endoscopy;  Laterality: N/A;   ESOPHAGOGASTRODUODENOSCOPY  01/22/2009   erosive esophagitis   HARDWARE REMOVAL Left 12/31/2017   Procedure: HARDWARE REMOVAL;  Surgeon: Samson Frederic, MD;  Location: [facility];  Service: Orthopedics;  Laterality: Left;   INTRAMEDULLARY (IM) NAIL INTERTROCHANTERIC Left 12/31/2017   Procedure:  INTRAMEDULLARY (IM) NAIL SUBTROCHANTRIC;  Surgeon: Samson Frederic, MD;  Location: [facility];  Service: Orthopedics;  Laterality: Left;   IR CV LINE INJECTION  03/23/2018   IR FLUORO GUIDE CV LINE LEFT  01/01/2018   IR FLUORO GUIDE CV LINE LEFT  03/24/2018   IR FLUORO GUIDE CV LINE LEFT  05/21/2018   IR FLUORO GUIDE CV LINE LEFT  07/09/2018   IR FLUORO GUIDE CV LINE LEFT  12/28/2020   IR FLUORO GUIDE CV LINE LEFT  07/02/2021   IR FLUORO GUIDE CV LINE RIGHT  11/01/2020   IR GENERIC HISTORICAL  07/04/2016   IR REMOVAL TUN CV CATH W/O FL 07/04/2016 Brayton El, PA-C [facility]   IR GENERIC HISTORICAL  07/10/2016   IR US GUIDE VASC ACCESS LEFT 07/10/2016 Oley Balm, MD [facility]   IR GENERIC HISTORICAL  07/10/2016   IR FLUORO GUIDE CV LINE LEFT 07/10/2016 Oley Balm, MD [facility]   IR PATIENT EVAL TECH 0-60 MINS  10/07/2017   IR PATIENT EVAL TECH 0-60 MINS  03/09/2018   IR RADIOLOGIST EVAL & MGMT  07/21/2017   IR RADIOLOGIST EVAL & MGMT  11/14/2020   IR RADIOLOGIST EVAL & MGMT  11/27/2020   IR RADIOLOGIST EVAL & MGMT  01/24/2021   IR REMOVAL TUN CV CATH W/O FL  12/28/2020   IR TRANSCATH PLC STENT  INITIAL VEIN  INC ANGIOPLASTY  12/28/2020   IR US GUIDE VASC ACCESS LEFT  11/01/2020   IR US GUIDE VASC ACCESS LEFT  12/28/2020   IR US GUIDE VASC ACCESS LEFT  12/28/2020   IR US GUIDE VASC ACCESS LEFT  12/28/2020   IR US GUIDE VASC ACCESS RIGHT  11/01/2020   IR VENO/EXT/UNI LEFT  11/01/2020   IR VENOCAVAGRAM SVC  12/28/2020   LEFT HEART CATH AND CORONARY ANGIOGRAPHY N/A 06/26/2020   Procedure: LEFT HEART CATH AND CORONARY ANGIOGRAPHY;  Surgeon: Dolores Patty, MD;  Location: [facility];  Service: Cardiovascular;  Laterality: N/A;   ORIF PROXIMAL FEMORAL FRACTURE W/ ITST NAIL SYSTEM  03/2007   left,  Dr. Simonne Come   SMALL INTESTINE SURGERY  2005   multiple with right colon resection for ischemia/infarct   Social History   Social History Narrative   Married to [place], has 1 daughter and a  granddaughter the patient's son died in childhood due to a motor vehicle wreck   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   No alcohol tobacco or drug use   04/07/2017   family history includes AAA (abdominal aortic aneurysm) in her mother; Diabetes in her mother; Hypertension in her mother.   Review of Systems As per HPI  Objective:   Physical Exam BP 114/60   Pulse (!) 53   Ht 5\' 8"  (1.727 m)   Wt 150 lb 6.4 oz (68.2 kg)   LMP 02/26/2014   BMI 22.87 kg/m  NAD Lungs cta Chest wall shows a central line catheter in the left upper chest area appropriately bandaged and clean without signs of infection Cor s1s2 2/6 sem no diastolic murmur Abd soft NT no mass

## 2021-07-24 NOTE — Telephone Encounter (Signed)
[facility] Pre-operative Risk Assessment  ?   ?Request for surgical clearance:     Endoscopy Procedure ? ?What type of surgery is being performed?     EGD & Colonoscopy ? ?When is this surgery scheduled?     09/20/21 ? ?What type of clearance is required ?   Pharmacy ? ?Are there any medications that need to be held prior to surgery and how long? Xarelto, day before and day of procedure ? ?Practice name and name of physician performing surgery?      [facility] ? ?What is your office phone and fax number?      Phone- [phone]  Fax- [phone] ? ?Anesthesia type (None, local, MAC, general) ?       [facility] ? ?

## 2021-07-25 NOTE — Telephone Encounter (Signed)
Ok to hold Xarelto the day before and the day of as requested ?

## 2021-07-26 NOTE — Telephone Encounter (Signed)
[name] [name] has been informed and verbalized understanding. ?

## 2021-07-31 ENCOUNTER — Telehealth: Payer: Self-pay | Admitting: Internal Medicine

## 2021-07-31 NOTE — Progress Notes (Signed)
? ?Patient Care Team: ?[name], MD as PCP - General (Family Medicine) ?Gatha Mayer, MD as Consulting Physician (Gastroenterology) ?Nicholas Lose, MD as Consulting Physician (Hematology and Oncology) ?Madelin Rear, [facility] as Pharmacist (Pharmacist) ? ?DIAGNOSIS:  ?Encounter Diagnosis  ?Name Primary?  ? Thrombocytopenia ([place])   ?  ?CHIEF COMPLIANT:  Follow-up for thrombocytopenia and anemia ? ?INTERVAL HISTORY: [name] [name] is a  71 y.o. with above-mentioned history of thrombocytopenia and anemia. Labs on 12/07/20 RBC 2.72, Hg 8.0, HCT 24.9, platelets 94. She is currently on Xarelto for secondary stroke prevention. She presents to the clinic today for follow-up. She states that she feels good after being in the hospital. ?She was hospitalized from 06/29/2021-07/03/2021 for disorientation, edema of the left arm and left breast found to have strep pyogenes bacteremia.  Infectious disease were consulted and she was treated with antibiotics.  She gets TPN at home. ? ? ?ALLERGIES:  has No Known Allergies. ? ?MEDICATIONS:  ?Current Outpatient Medications  ?Medication Sig Dispense Refill  ? acetaminophen (TYLENOL) 500 MG tablet Take 1,000 mg by mouth daily as needed for mild pain.    ? ADULT TPN Inject 1,800 mLs into the vein 4 (four) times a week. Pt receives home TPN from [facility]:  1800 mL bag, four nights weekly (Monday, Tuesday, Wednesday, Thursday for 8 hours (includes 1 hour taper up and down).    ? baclofen (LIORESAL) 10 MG tablet Take 1 tablet (10 mg total) by mouth 2 (two) times daily as needed for muscle spasms. 60 each 4  ? Calcium Carb-Cholecalciferol (CALCIUM + D3 PO) Take 2 tablets by mouth daily.    ? cetirizine (ZYRTEC) 10 MG tablet Take 1 tablet (10 mg total) by mouth at bedtime as needed for allergies. 90 tablet 1  ? clobetasol ointment (TEMOVATE) 5.52 % Apply 1 application topically 2 (two) times daily.    ? diphenhydrAMINE (BENADRYL) 25 mg capsule Take 25-50 mg by mouth every 6 (six) hours  as needed for itching or sleep.     ? empagliflozin (JARDIANCE) 10 MG TABS tablet Take 1 tablet (10 mg total) by mouth daily before breakfast. 90 tablet 3  ? fluticasone (FLONASE) 50 MCG/ACT nasal spray Place 2 sprays into both nostrils daily as needed for allergies or rhinitis.    ? furosemide (LASIX) 20 MG tablet TAKE ONE TABLET BY MOUTH AS NEEDED FOR SWELLING 45 tablet 1  ? Heparin Sodium, Porcine, (HEPARIN LOCK FLUSH IJ) Inject 5 mLs as directed 4 (four) times a week. Monday, Tuesday, Wednesday, Thursday in the morning with TPN    ? losartan (COZAAR) 25 MG tablet Take 0.5 tablets (12.5 mg total) by mouth at bedtime. 90 tablet 3  ? meclizine (ANTIVERT) 25 MG tablet Take 1 tablet (25 mg total) by mouth 3 (three) times daily as needed for dizziness. 30 tablet 0  ? Multiple Vitamins-Minerals (ADULT GUMMY PO) Take 2 tablets by mouth daily.    ? omeprazole (PRILOSEC) 40 MG capsule TAKE 1 CAPSULE BY MOUTH EVERY DAY BEFORE BREAKFAST 90 capsule 1  ? ondansetron (ZOFRAN-ODT) 8 MG disintegrating tablet TAKE 1 TABLET (8 MG TOTAL) BY MOUTH EVERY 8 (EIGHT) HOURS AS NEEDED FOR NAUSEA OR VOMITING. 30 tablet 11  ? sodium chloride 0.9 % infusion Inject 900 mLs into the vein every 14 (fourteen) days. Uses for TPN    ? spironolactone (ALDACTONE) 25 MG tablet TAKE 1/2 TABLET BY MOUTH EVERY DAY 45 tablet 1  ? Teduglutide, rDNA, 5 MG KIT Inject  3.1 application into the skin every morning.    ? triamcinolone cream (KENALOG) 0.1 % Apply 1 application topically as needed (itching).    ? vitamin E 1000 UNIT capsule Take 1,000 Units by mouth daily.    ? XARELTO 20 MG TABS tablet TAKE 1 TABLET (20 MG TOTAL) BY MOUTH DAILY WITH SUPPER. 90 tablet 2  ? ?No current facility-administered medications for this visit.  ? ? ?PHYSICAL EXAMINATION: ?ECOG PERFORMANCE STATUS: 1 - Symptomatic but completely ambulatory ? ?Vitals:  ? 08/01/21 1410  ?BP: (!) 119/59  ?Pulse: 63  ?Resp: 18  ?Temp: 97.9 ?F (36.6 ?C)  ?SpO2: 99%  ? ?Filed Weights  ? 08/01/21  1410  ?Weight: 149 lb 9.6 oz (67.9 kg)  ?  ? ?LABORATORY DATA:  ?I have reviewed the data as listed ? ?  Latest Ref Rng & Units 07/22/2021  ? 10:45 AM 07/12/2021  ?  2:40 PM 07/03/2021  ?  8:00 AM  ?CMP  ?Glucose 70 - 99 mg/dL 113   77     ?BUN 6 - 23 mg/dL 27   21     ?Creatinine 0.40 - 1.20 mg/dL 1.15   1.29     ?Sodium 135 - 145 mEq/L 134   137     ?Potassium 3.5 - 5.1 mEq/L 4.1   4.0   3.3    ?Chloride 96 - 112 mEq/L 104   110     ?CO2 19 - 32 mEq/L 24   22     ?Calcium 8.4 - 10.5 mg/dL 8.2   8.4     ?Total Protein 6.0 - 8.3 g/dL 7.7   7.9     ?Total Bilirubin 0.2 - 1.2 mg/dL 0.4   0.4     ?Alkaline Phos 39 - 117 U/L 157   155     ?AST 0 - 37 U/L 29   37     ?ALT 0 - 35 U/L 11   6     ? ? ?Lab Results  ?Component Value Date  ? WBC 4.8 08/01/2021  ? HGB 9.8 (L) 08/01/2021  ? HCT 31.6 (L) 08/01/2021  ? MCV 92.4 08/01/2021  ? PLT 80 (L) 08/01/2021  ? NEUTROABS 1.7 08/01/2021  ? ? ?ASSESSMENT & PLAN:  ?Thrombocytopenia ([place]) ?Normochromic normocytic anemia due 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 She received last Aranesp injection February 2016. She had required a total of 3 Aranesp injections in 2015, while she received at least 10 injections in 2014. So we discontinued therapy. ?  ?Current treatment:  Reinitiated Aranesp 10/26/2020.  Also received July 2022.  Holding August 2022 (hemoglobin 10.7) ?  ?Short bowel syndrome on Gattex, Teduglutide she is currently on TPN 3 nights a week She had surgery on her small intestine for blood clots in 2006 and since then she has short gut syndrome. This is the reason for her chronic disease. ?  ?Patient's baseline platelet count is around 100 ?09/17/2020: Hemoglobin 9.7, platelets 92 ?10/10/2020: Hemoglobin 8.5, platelets 52 ?10/22/2020: Hemoglobin 8.5, platelets 92 ?11/01/2020: Hemoglobin 9.8, platelets 104 ?12/19/2020: Hemoglobin 10.7, platelets 98 ?06/21/2021: Hemoglobin 7.8, platelets 92, MCV 93, (1 unit PRBC and Aranesp) ?07/22/2021: Hemoglobin  10, MCV 91.7, RDW 18.6, platelets 83 ?08/01/2021: Hemoglobin 9.8, platelets 80 ? ?I did not recommend giving Aranesp at this time since her hemoglobin is up to 10. ?Return to clinic in 6 months for follow-up.  She gets monthly lab draws from Dr. Carlean Purl. ? ? ?  No orders of the defined types were placed in this encounter. ? ?The patient has a good understanding of the overall plan. she agrees with it. she will call with any problems that may develop before the next visit here. ?Total time spent: 30 mins including face to face time and time spent for planning, charting and co-ordination of care ? ? Harriette Ohara, MD ?08/01/21 ? ? ? I Gardiner Coins am scribing for Dr. Lindi Adie ? ?I have reviewed the above documentation for accuracy and completeness, and I agree with the above. ?  ?

## 2021-07-31 NOTE — Telephone Encounter (Signed)
?  Spoke to [employer].D. regarding transitioning Joanny's TPN to [employer]  Signing orders for that and intermittent hydration.  These will be faxed. ?

## 2021-08-01 ENCOUNTER — Inpatient Hospital Stay: Payer: Medicare Other

## 2021-08-01 ENCOUNTER — Other Ambulatory Visit: Payer: Self-pay

## 2021-08-01 ENCOUNTER — Inpatient Hospital Stay (HOSPITAL_BASED_OUTPATIENT_CLINIC_OR_DEPARTMENT_OTHER): Payer: Medicare Other | Admitting: Hematology and Oncology

## 2021-08-01 DIAGNOSIS — D631 Anemia in chronic kidney disease: Secondary | ICD-10-CM | POA: Diagnosis not present

## 2021-08-01 DIAGNOSIS — N1831 Chronic kidney disease, stage 3a: Secondary | ICD-10-CM | POA: Diagnosis not present

## 2021-08-01 DIAGNOSIS — K912 Postsurgical malabsorption, not elsewhere classified: Secondary | ICD-10-CM | POA: Diagnosis not present

## 2021-08-01 DIAGNOSIS — D696 Thrombocytopenia, unspecified: Secondary | ICD-10-CM | POA: Diagnosis not present

## 2021-08-01 DIAGNOSIS — D508 Other iron deficiency anemias: Secondary | ICD-10-CM

## 2021-08-01 DIAGNOSIS — D638 Anemia in other chronic diseases classified elsewhere: Secondary | ICD-10-CM

## 2021-08-01 LAB — CMP (CANCER CENTER ONLY)
ALT: 27 U/L (ref 0–44)
AST: 54 U/L — ABNORMAL HIGH (ref 15–41)
Albumin: 3.1 g/dL — ABNORMAL LOW (ref 3.5–5.0)
Alkaline Phosphatase: 173 U/L — ABNORMAL HIGH (ref 38–126)
Anion gap: 4 — ABNORMAL LOW (ref 5–15)
BUN: 31 mg/dL — ABNORMAL HIGH (ref 8–23)
CO2: 26 mmol/L (ref 22–32)
Calcium: 8.9 mg/dL (ref 8.9–10.3)
Chloride: 106 mmol/L (ref 98–111)
Creatinine: 1.11 mg/dL — ABNORMAL HIGH (ref 0.44–1.00)
GFR, Estimated: 53 mL/min — ABNORMAL LOW (ref >60)
Glucose, Bld: 89 mg/dL (ref 70–99)
Potassium: 4.9 mmol/L (ref 3.5–5.1)
Sodium: 136 mmol/L (ref 135–145)
Total Bilirubin: 0.5 mg/dL (ref 0.3–1.2)
Total Protein: 7.8 g/dL (ref 6.5–8.1)

## 2021-08-01 LAB — IRON AND IRON BINDING CAPACITY (CC-WL,HP ONLY)
Iron: 45 ug/dL (ref 28–170)
Saturation Ratios: 12 % (ref 10.4–31.8)
TIBC: 371 ug/dL (ref 250–450)
UIBC: 326 ug/dL (ref 148–442)

## 2021-08-01 LAB — CBC WITH DIFFERENTIAL (CANCER CENTER ONLY)
Abs Immature Granulocytes: 0 10*3/uL (ref 0.00–0.07)
Basophils Absolute: 0.1 10*3/uL (ref 0.0–0.1)
Basophils Relative: 2 %
Eosinophils Absolute: 0.9 10*3/uL — ABNORMAL HIGH (ref 0.0–0.5)
Eosinophils Relative: 18 %
HCT: 31.6 % — ABNORMAL LOW (ref 36.0–46.0)
Hemoglobin: 9.8 g/dL — ABNORMAL LOW (ref 12.0–15.0)
Immature Granulocytes: 0 %
Lymphocytes Relative: 34 %
Lymphs Abs: 1.6 10*3/uL (ref 0.7–4.0)
MCH: 28.7 pg (ref 26.0–34.0)
MCHC: 31 g/dL (ref 30.0–36.0)
MCV: 92.4 fL (ref 80.0–100.0)
Monocytes Absolute: 0.5 10*3/uL (ref 0.1–1.0)
Monocytes Relative: 11 %
Neutro Abs: 1.7 10*3/uL (ref 1.7–7.7)
Neutrophils Relative %: 35 %
Platelet Count: 80 10*3/uL — ABNORMAL LOW (ref 150–400)
RBC: 3.42 MIL/uL — ABNORMAL LOW (ref 3.87–5.11)
RDW: 17.5 % — ABNORMAL HIGH (ref 11.5–15.5)
WBC Count: 4.8 10*3/uL (ref 4.0–10.5)
nRBC: 0 % (ref 0.0–0.2)

## 2021-08-01 LAB — SAMPLE TO BLOOD BANK

## 2021-08-01 LAB — FERRITIN: Ferritin: 69 ng/mL (ref 11–307)

## 2021-08-01 NOTE — Assessment & Plan Note (Signed)
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 She received last Aranesp injection February 2016. She had?required a total of 3 Aranesp injections in 2015, while she received at least 10 injections in 2014. So we discontinued therapy. ?? ?Current treatment:??Reinitiated?Aranesp 10/26/2020. ?Also received July 2022. ?Holding August 2022 (hemoglobin 10.7) ?? ?Short bowel syndrome?on Gattex, Teduglutide she is currently on TPN 3 nights a week She had surgery on her small intestine for blood clots in 2006 and since then she has short gut syndrome. This is the reason for her chronic disease. ?? ?Patient's baseline platelet count is around 100 ?09/17/2020: Hemoglobin 9.7, platelets 92 ?10/10/2020: Hemoglobin 8.5, platelets 52 ?10/22/2020:?Hemoglobin 8.5, platelets 92 ?11/01/2020: Hemoglobin 9.8, platelets 104 ?12/19/2020: Hemoglobin 10.7, platelets 98 ?06/21/2021: Hemoglobin 7.8, platelets 92, MCV 93, (1 unit PRBC and Aranesp) ?07/22/2021: Hemoglobin 10, MCV 91.7, RDW 18.6, platelets 83 ? ?Okay to proceed with Aranesp ?

## 2021-08-02 DIAGNOSIS — H903 Sensorineural hearing loss, bilateral: Secondary | ICD-10-CM | POA: Diagnosis not present

## 2021-08-02 DIAGNOSIS — H6981 Other specified disorders of Eustachian tube, right ear: Secondary | ICD-10-CM | POA: Diagnosis not present

## 2021-08-02 DIAGNOSIS — H6122 Impacted cerumen, left ear: Secondary | ICD-10-CM | POA: Diagnosis not present

## 2021-08-02 DIAGNOSIS — H838X3 Other specified diseases of inner ear, bilateral: Secondary | ICD-10-CM | POA: Diagnosis not present

## 2021-08-05 ENCOUNTER — Other Ambulatory Visit: Payer: Self-pay | Admitting: Otolaryngology

## 2021-08-05 ENCOUNTER — Telehealth: Payer: Self-pay | Admitting: Hematology and Oncology

## 2021-08-05 DIAGNOSIS — H903 Sensorineural hearing loss, bilateral: Secondary | ICD-10-CM

## 2021-08-05 NOTE — Telephone Encounter (Signed)
Scheduled appointment per 3/23 los. Patient is aware. ?

## 2021-08-06 DIAGNOSIS — H21561 Pupillary abnormality, right eye: Secondary | ICD-10-CM | POA: Diagnosis not present

## 2021-08-06 DIAGNOSIS — H25811 Combined forms of age-related cataract, right eye: Secondary | ICD-10-CM | POA: Diagnosis not present

## 2021-08-06 DIAGNOSIS — H25041 Posterior subcapsular polar age-related cataract, right eye: Secondary | ICD-10-CM | POA: Diagnosis not present

## 2021-08-06 DIAGNOSIS — H2511 Age-related nuclear cataract, right eye: Secondary | ICD-10-CM | POA: Diagnosis not present

## 2021-08-06 DIAGNOSIS — H25011 Cortical age-related cataract, right eye: Secondary | ICD-10-CM | POA: Diagnosis not present

## 2021-08-09 ENCOUNTER — Telehealth: Payer: Self-pay | Admitting: Internal Medicine

## 2021-08-09 NOTE — Telephone Encounter (Signed)
Ellana from [facility] stated that she sent Dr. Carlean Purl an email after calling earlier stating the labs that they were requesting to be drawn:  ?Please advise ? ?

## 2021-08-09 NOTE — Telephone Encounter (Signed)
Dr. Carlean Purl, ? ?My name is Jiles Garter, and I am the clinician at [facility] working with [name] [name]. She is such a sweet lady and I look forward to helping her with her PN needs! Upon review of the labs that I have for [name], I noticed that I do not have any results for trace elements or vitamins in the past 6 months. Would it be possible to add the following to her next lab draw on 08/22/21 and then Q6 months after: Zn, Cu, Se, Vit A, Vit D OH-25, Vit E, Vit B12? I saw that she just had an iron panel done, so hopefully we won't need another one until 6 months from now in September. I apologize if those have already been ordered, I am unable to see what lab orders she has, just that she has lab draw scheduled on 4/13. ? ?Thank you so much! ?Jiles Garter ? ?[name], MSN, RN, CCRN-K ?Clinical Nurse & Nursing Contract Coordinator ?   Phone: [phone] Fax: [phone] ? ? ? ?Remo Lipps - please do the orders she has requested and let her know we will. ? ?Thanks ? ? ?

## 2021-08-09 NOTE — Telephone Encounter (Signed)
Inbound call from [facility] stating that would like a call back to discuss information about patient. Please advise.  ? ? ?[phone] ?Ext [phone] ?

## 2021-08-09 NOTE — Telephone Encounter (Signed)
Left message for [name] [name] to call back:  ?

## 2021-08-12 ENCOUNTER — Other Ambulatory Visit: Payer: Self-pay

## 2021-08-12 DIAGNOSIS — K912 Postsurgical malabsorption, not elsewhere classified: Secondary | ICD-10-CM

## 2021-08-12 DIAGNOSIS — K90829 Short bowel syndrome, unspecified: Secondary | ICD-10-CM

## 2021-08-12 DIAGNOSIS — Z789 Other specified health status: Secondary | ICD-10-CM

## 2021-08-12 DIAGNOSIS — D638 Anemia in other chronic diseases classified elsewhere: Secondary | ICD-10-CM

## 2021-08-12 DIAGNOSIS — K760 Fatty (change of) liver, not elsewhere classified: Secondary | ICD-10-CM

## 2021-08-12 NOTE — Telephone Encounter (Signed)
Orders for labs placed in Epic: [name] [name] and Pt made aware: Pt stated that she has labs that Dr. Carlean Purl orders at the beginning of every month and that she was planning on coming to the lab tomorrow for that: I do not see any other labs besides the ones that I submitted: ?Please advise if you would like for me to place additional labs for this pt:  ?

## 2021-08-12 NOTE — Telephone Encounter (Signed)
[facility] and some from [place] - I am not clear on what has been done and had deferred to the prior TPN service ? ?Let's find out what they want on a routine basis and how often and we can set that up. ? ?Barbera Setters may remember what we were doing better and we can ask when she is back. ? ?Jaryah just had CBC CMET and iron studies 10 d ago so not crucial that we do that. ? ?If you look at 3/13 that shows labs we were doing routinely for prior TPN service, I think ?

## 2021-08-13 ENCOUNTER — Other Ambulatory Visit (INDEPENDENT_AMBULATORY_CARE_PROVIDER_SITE_OTHER): Payer: Medicare Other

## 2021-08-13 DIAGNOSIS — K912 Postsurgical malabsorption, not elsewhere classified: Secondary | ICD-10-CM | POA: Diagnosis not present

## 2021-08-13 DIAGNOSIS — D638 Anemia in other chronic diseases classified elsewhere: Secondary | ICD-10-CM

## 2021-08-13 DIAGNOSIS — Z789 Other specified health status: Secondary | ICD-10-CM

## 2021-08-13 DIAGNOSIS — K760 Fatty (change of) liver, not elsewhere classified: Secondary | ICD-10-CM

## 2021-08-13 LAB — CBC WITH DIFFERENTIAL/PLATELET
Basophils Absolute: 0 10*3/uL (ref 0.0–0.1)
Basophils Relative: 0.7 % (ref 0.0–3.0)
Eosinophils Absolute: 0.7 10*3/uL (ref 0.0–0.7)
Eosinophils Relative: 11.8 % — ABNORMAL HIGH (ref 0.0–5.0)
HCT: 31.4 % — ABNORMAL LOW (ref 36.0–46.0)
Hemoglobin: 10.1 g/dL — ABNORMAL LOW (ref 12.0–15.0)
Lymphocytes Relative: 37.2 % (ref 12.0–46.0)
Lymphs Abs: 2.2 10*3/uL (ref 0.7–4.0)
MCHC: 32.3 g/dL (ref 30.0–36.0)
MCV: 91.5 fl (ref 78.0–100.0)
Monocytes Absolute: 0.7 10*3/uL (ref 0.1–1.0)
Monocytes Relative: 11.6 % (ref 3.0–12.0)
Neutro Abs: 2.3 10*3/uL (ref 1.4–7.7)
Neutrophils Relative %: 38.7 % — ABNORMAL LOW (ref 43.0–77.0)
Platelets: 70 10*3/uL — ABNORMAL LOW (ref 150.0–400.0)
RBC: 3.43 Mil/uL — ABNORMAL LOW (ref 3.87–5.11)
RDW: 17.6 % — ABNORMAL HIGH (ref 11.5–15.5)
WBC: 5.9 10*3/uL (ref 4.0–10.5)

## 2021-08-13 LAB — COMPREHENSIVE METABOLIC PANEL
ALT: 21 U/L (ref 0–35)
AST: 23 U/L (ref 0–37)
Albumin: 3.6 g/dL (ref 3.5–5.2)
Alkaline Phosphatase: 199 U/L — ABNORMAL HIGH (ref 39–117)
BUN: 43 mg/dL — ABNORMAL HIGH (ref 6–23)
CO2: 17 mEq/L — ABNORMAL LOW (ref 19–32)
Calcium: 8.8 mg/dL (ref 8.4–10.5)
Chloride: 110 mEq/L (ref 96–112)
Creatinine, Ser: 1.27 mg/dL — ABNORMAL HIGH (ref 0.40–1.20)
GFR: 42.62 mL/min — ABNORMAL LOW (ref >60.00)
Glucose, Bld: 84 mg/dL (ref 70–99)
Potassium: 3.1 mEq/L — ABNORMAL LOW (ref 3.5–5.1)
Sodium: 135 mEq/L (ref 135–145)
Total Bilirubin: 0.5 mg/dL (ref 0.2–1.2)
Total Protein: 8.4 g/dL — ABNORMAL HIGH (ref 6.0–8.3)

## 2021-08-13 LAB — VITAMIN B12: Vitamin B-12: 421 pg/mL (ref 211–911)

## 2021-08-13 LAB — TRIGLYCERIDES: Triglycerides: 97 mg/dL (ref 0.0–149.0)

## 2021-08-13 LAB — VITAMIN D 25 HYDROXY (VIT D DEFICIENCY, FRACTURES): VITD: 8.72 ng/mL — ABNORMAL LOW (ref 30.00–100.00)

## 2021-08-13 LAB — MAGNESIUM: Magnesium: 2 mg/dL (ref 1.5–2.5)

## 2021-08-13 LAB — PHOSPHORUS: Phosphorus: 2.7 mg/dL (ref 2.3–4.6)

## 2021-08-13 NOTE — Telephone Encounter (Signed)
Spoke with [name] RN with [name] and clarified what labs are they requesting:   ?[name] [name] stated that every month that she is requesting CBC, CMP, Magnesium, Prealbumin, Triglycerides, Phosphorus: ?Every 6 months they are requesting Zn, Cu, Se, Vit A, Vit OH-25, Vit E, Vitamin 12, Iron panel: ?Pt made aware that labs have been ordered:  Pt stated that  she will come by the Lab to have labs drawn today: ?Reminder placed in Epic to repeat labs each month and in 6 months: ?Pt verbalized understanding with all questions answered.  ? ? ?

## 2021-08-15 ENCOUNTER — Encounter (HOSPITAL_BASED_OUTPATIENT_CLINIC_OR_DEPARTMENT_OTHER): Payer: Self-pay | Admitting: Emergency Medicine

## 2021-08-15 ENCOUNTER — Other Ambulatory Visit: Payer: Self-pay

## 2021-08-15 ENCOUNTER — Telehealth: Payer: Self-pay | Admitting: Internal Medicine

## 2021-08-15 ENCOUNTER — Emergency Department (HOSPITAL_BASED_OUTPATIENT_CLINIC_OR_DEPARTMENT_OTHER)
Admission: EM | Admit: 2021-08-15 | Discharge: 2021-08-15 | Disposition: A | Discharge status: Home / Self Care | Payer: Medicare Other | Attending: Emergency Medicine | Admitting: Emergency Medicine

## 2021-08-15 DIAGNOSIS — Z7901 Long term (current) use of anticoagulants: Secondary | ICD-10-CM | POA: Diagnosis not present

## 2021-08-15 DIAGNOSIS — R944 Abnormal results of kidney function studies: Secondary | ICD-10-CM | POA: Insufficient documentation

## 2021-08-15 DIAGNOSIS — E860 Dehydration: Secondary | ICD-10-CM | POA: Insufficient documentation

## 2021-08-15 DIAGNOSIS — Z79899 Other long term (current) drug therapy: Secondary | ICD-10-CM | POA: Diagnosis not present

## 2021-08-15 DIAGNOSIS — E876 Hypokalemia: Secondary | ICD-10-CM | POA: Diagnosis not present

## 2021-08-15 DIAGNOSIS — Z452 Encounter for adjustment and management of vascular access device: Secondary | ICD-10-CM

## 2021-08-15 DIAGNOSIS — R799 Abnormal finding of blood chemistry, unspecified: Secondary | ICD-10-CM | POA: Diagnosis present

## 2021-08-15 LAB — COMPREHENSIVE METABOLIC PANEL
ALT: 43 U/L (ref 0–44)
AST: 55 U/L — ABNORMAL HIGH (ref 15–41)
Albumin: 3 g/dL — ABNORMAL LOW (ref 3.5–5.0)
Alkaline Phosphatase: 190 U/L — ABNORMAL HIGH (ref 38–126)
Anion gap: 5 (ref 5–15)
BUN: 33 mg/dL — ABNORMAL HIGH (ref 8–23)
CO2: 19 mmol/L — ABNORMAL LOW (ref 22–32)
Calcium: 8.5 mg/dL — ABNORMAL LOW (ref 8.9–10.3)
Chloride: 114 mmol/L — ABNORMAL HIGH (ref 98–111)
Creatinine, Ser: 1.16 mg/dL — ABNORMAL HIGH (ref 0.44–1.00)
GFR, Estimated: 50 mL/min — ABNORMAL LOW (ref >60)
Glucose, Bld: 87 mg/dL (ref 70–99)
Potassium: 3.2 mmol/L — ABNORMAL LOW (ref 3.5–5.1)
Sodium: 138 mmol/L (ref 135–145)
Total Bilirubin: 0.6 mg/dL (ref 0.3–1.2)
Total Protein: 8.4 g/dL — ABNORMAL HIGH (ref 6.5–8.1)

## 2021-08-15 LAB — CBC WITH DIFFERENTIAL/PLATELET
Abs Immature Granulocytes: 0.01 10*3/uL (ref 0.00–0.07)
Basophils Absolute: 0.1 10*3/uL (ref 0.0–0.1)
Basophils Relative: 1 %
Eosinophils Absolute: 1 10*3/uL — ABNORMAL HIGH (ref 0.0–0.5)
Eosinophils Relative: 17 %
HCT: 31.5 % — ABNORMAL LOW (ref 36.0–46.0)
Hemoglobin: 10.1 g/dL — ABNORMAL LOW (ref 12.0–15.0)
Immature Granulocytes: 0 %
Lymphocytes Relative: 34 %
Lymphs Abs: 1.9 10*3/uL (ref 0.7–4.0)
MCH: 29.6 pg (ref 26.0–34.0)
MCHC: 32.1 g/dL (ref 30.0–36.0)
MCV: 92.4 fL (ref 80.0–100.0)
Monocytes Absolute: 0.6 10*3/uL (ref 0.1–1.0)
Monocytes Relative: 11 %
Neutro Abs: 2.1 10*3/uL (ref 1.7–7.7)
Neutrophils Relative %: 37 %
Platelets: 75 10*3/uL — ABNORMAL LOW (ref 150–400)
RBC: 3.41 MIL/uL — ABNORMAL LOW (ref 3.87–5.11)
RDW: 17.7 % — ABNORMAL HIGH (ref 11.5–15.5)
WBC: 5.7 10*3/uL (ref 4.0–10.5)
nRBC: 0 % (ref 0.0–0.2)

## 2021-08-15 LAB — I-STAT VENOUS BLOOD GAS, ED
Acid-base deficit: 8 mmol/L — ABNORMAL HIGH (ref 0.0–2.0)
Bicarbonate: 18.4 mmol/L — ABNORMAL LOW (ref 20.0–28.0)
Calcium, Ion: 1.32 mmol/L (ref 1.15–1.40)
HCT: 34 % — ABNORMAL LOW (ref 36.0–46.0)
Hemoglobin: 11.6 g/dL — ABNORMAL LOW (ref 12.0–15.0)
O2 Saturation: 33 %
Potassium: 3.5 mmol/L (ref 3.5–5.1)
Sodium: 144 mmol/L (ref 135–145)
TCO2: 20 mmol/L — ABNORMAL LOW (ref 22–32)
pCO2, Ven: 39.8 mmHg — ABNORMAL LOW (ref 44–60)
pH, Ven: 7.272 (ref 7.25–7.43)
pO2, Ven: 23 mmHg — CL (ref 32–45)

## 2021-08-15 MED ORDER — POTASSIUM CHLORIDE CRYS ER 20 MEQ PO TBCR
40.0000 meq | EXTENDED_RELEASE_TABLET | Freq: Once | ORAL | Status: AC
Start: 2021-08-15 — End: 2021-08-15
  Administered 2021-08-15: 40 meq via ORAL
  Filled 2021-08-15: qty 2

## 2021-08-15 MED ORDER — SODIUM CHLORIDE 0.9 % IV BOLUS
1000.0000 mL | Freq: Once | INTRAVENOUS | Status: AC
  Administered 2021-08-15: 1000 mL via INTRAVENOUS

## 2021-08-15 MED ORDER — POTASSIUM CHLORIDE 10 MEQ/100ML IV SOLN
10.0000 meq | Freq: Once | INTRAVENOUS | Status: AC
  Administered 2021-08-15: 10 meq via INTRAVENOUS
  Filled 2021-08-15: qty 100

## 2021-08-15 MED ORDER — POTASSIUM CHLORIDE CRYS ER 20 MEQ PO TBCR: 20.0000 meq | EXTENDED_RELEASE_TABLET | Freq: Two times a day (BID) | ORAL | 0 refills | Status: DC

## 2021-08-15 NOTE — Discharge Instructions (Signed)
Recommend increase hydration.  Take potassium supplement as prescribed.  Follow-up with your primary care doctor for lab recheck next week.  Please return if symptoms worsen as discussed. ?

## 2021-08-15 NOTE — Telephone Encounter (Signed)
Elaina from [place] wanted to speak to you about patient.  Please call her at [phone], ext [phone]. ? ?Thank you. ?

## 2021-08-15 NOTE — ED Triage Notes (Signed)
Pt reports she was told to come to the ER for reports of "metabolic acidosis." Pt reports she had routine labs drawn on Tuesday. Pt has no complaints of pain, N/V.  ?

## 2021-08-15 NOTE — Telephone Encounter (Signed)
She has a good point - please tell Leana she needs a check for acidosis - these numbers are similar to what she had in Feb when she was in -  ? ?I think they could most likely treat her there and send home but I agree with Jiles Garter about this ?

## 2021-08-15 NOTE — ED Provider Notes (Signed)
?[facility] EMERGENCY DEPARTMENT ?Provider Note ? ? ?CSN: [id number] ?Arrival date & time: 08/15/21  1730 ? ?  ? ?History ? ?Chief Complaint  ?Patient presents with  ? Abnormal Lab  ? ? ?[name] [name] is a 71 y.o. female. ? ?Patient sent here by GI for repeat labs.  She is unsure of the labs but she was told that she might have an acidosis.  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  Denies any nausea, vomiting.  Does not have any physical symptoms.  Denies fever or chill.  She is not quite sure what labs were mostly concerned about.  Denies any abdominal pain.  Has had some slightly looser stools recently but otherwise has been at her baseline. ? ? ? ?  ? ?Home Medications ?Prior to Admission medications   ?Medication Sig Start Date End Date Taking? Authorizing Provider  ?potassium chloride SA (KLOR-CON M) 20 MEQ tablet Take 1 tablet (20 mEq total) by mouth 2 (two) times daily for 4 days. 08/15/21 08/19/21 Yes Chinwe Lope, DO  ?acetaminophen (TYLENOL) 500 MG tablet Take 1,000 mg by mouth daily as needed for mild pain.    [provider]  ?ADULT TPN Inject 1,800 mLs into the vein 4 (four) times a week. Pt receives home TPN from [facility]:  1800 mL bag, four nights weekly (Monday, Tuesday, Wednesday, Thursday for 8 hours (includes 1 hour taper up and down).    [provider]  ?baclofen (LIORESAL) 10 MG tablet Take 1 tablet (10 mg total) by mouth 2 (two) times daily as needed for muscle spasms. 08/29/20   Frann Rider, NP  ?Calcium Carb-Cholecalciferol (CALCIUM + D3 PO) Take 2 tablets by mouth daily.    [provider]  ?cetirizine (ZYRTEC) 10 MG tablet Take 1 tablet (10 mg total) by mouth at bedtime as needed for allergies. 12/19/20   Kennyth Arnold, FNP  ?clobetasol ointment (TEMOVATE) 7.03 % Apply 1 application topically 2 (two) times daily. 06/14/21   [provider]  ?diphenhydrAMINE (BENADRYL) 25 mg capsule Take 25-50 mg by mouth every 6 (six) hours as needed for itching  or sleep.     [provider]  ?empagliflozin (JARDIANCE) 10 MG TABS tablet Take 1 tablet (10 mg total) by mouth daily before breakfast. 06/11/21   Bensimhon, Shaune Pascal, MD  ?fluticasone (FLONASE) 50 MCG/ACT nasal spray Place 2 sprays into both nostrils daily as needed for allergies or rhinitis.    [provider]  ?furosemide (LASIX) 20 MG tablet TAKE ONE TABLET BY MOUTH AS NEEDED FOR SWELLING 02/11/21   Bensimhon, Shaune Pascal, MD  ?Heparin Sodium, Porcine, (HEPARIN LOCK FLUSH IJ) Inject 5 mLs as directed 4 (four) times a week. Monday, Tuesday, Wednesday, Thursday in the morning with TPN    [provider]  ?losartan (COZAAR) 25 MG tablet Take 0.5 tablets (12.5 mg total) by mouth at bedtime. 12/18/20 06/11/22  Bensimhon, Shaune Pascal, MD  ?meclizine (ANTIVERT) 25 MG tablet Take 1 tablet (25 mg total) by mouth 3 (three) times daily as needed for dizziness. 04/29/21   Haydee Salter, MD  ?Multiple Vitamins-Minerals (ADULT GUMMY PO) Take 2 tablets by mouth daily.    [provider]  ?omeprazole (PRILOSEC) 40 MG capsule TAKE 1 CAPSULE BY MOUTH EVERY DAY BEFORE BREAKFAST 06/28/21   Gatha Mayer, MD  ?ondansetron (ZOFRAN-ODT) 8 MG disintegrating tablet TAKE 1 TABLET (8 MG TOTAL) BY MOUTH EVERY 8 (EIGHT) HOURS AS NEEDED FOR NAUSEA OR VOMITING.  11/21/20   Gatha Mayer, MD  ?sodium chloride 0.9 % infusion Inject 900 mLs into the vein every 14 (fourteen) days. Uses for TPN 10/27/17   [provider]  ?spironolactone (ALDACTONE) 25 MG tablet TAKE 1/2 TABLET BY MOUTH EVERY DAY 07/24/21   Bensimhon, Shaune Pascal, MD  ?Teduglutide, rDNA, 5 MG KIT Inject 3.1 application into the skin every morning.    [provider]  ?triamcinolone cream (KENALOG) 0.1 % Apply 1 application topically as needed (itching).    [provider]  ?vitamin E 1000 UNIT capsule Take 1,000 Units by mouth daily.    [provider]  ?XARELTO 20 MG TABS tablet TAKE 1 TABLET (20 MG TOTAL) BY MOUTH DAILY  WITH SUPPER. 11/13/20   [name], MD  ?   ? ?Allergies    ?Patient has no known allergies.   ? ?Review of Systems   ?Review of Systems ? ?Physical Exam ?Updated Vital Signs ?BP (!) 114/95 (BP Location: Right Arm)   Pulse 60   Temp 98 ?F (36.7 ?C) (Oral)   Resp 17   LMP 02/26/2014   SpO2 100%  ?Physical Exam ?Vitals and nursing note reviewed.  ?Constitutional:   ?   General: She is not in acute distress. ?   Appearance: She is well-developed.  ?HENT:  ?   Head: Normocephalic and atraumatic.  ?Eyes:  ?   Extraocular Movements: Extraocular movements intact.  ?   Conjunctiva/sclera: Conjunctivae normal.  ?   Pupils: Pupils are equal, round, and reactive to light.  ?Cardiovascular:  ?   Rate and Rhythm: Normal rate and regular rhythm.  ?   Pulses: Normal pulses.  ?   Heart sounds: No murmur heard. ?Pulmonary:  ?   Effort: Pulmonary effort is normal. No respiratory distress.  ?   Breath sounds: Normal breath sounds.  ?Abdominal:  ?   Palpations: Abdomen is soft.  ?   Tenderness: There is no abdominal tenderness.  ?Musculoskeletal:     ?   General: No swelling.  ?   Cervical back: Neck supple.  ?Skin: ?   General: Skin is warm and dry.  ?   Capillary Refill: Capillary refill takes less than 2 seconds.  ?Neurological:  ?   General: No focal deficit present.  ?   Mental Status: She is alert.  ?Psychiatric:     ?   Mood and Affect: Mood normal.  ? ? ?ED Results / Procedures / Treatments   ?Labs ?(all labs ordered are listed, but only abnormal results are displayed) ?Labs Reviewed  ?CBC WITH DIFFERENTIAL/PLATELET - Abnormal; Notable for the following components:  ?    Result Value  ? RBC 3.41 (*)   ? Hemoglobin 10.1 (*)   ? HCT 31.5 (*)   ? RDW 17.7 (*)   ? Platelets 75 (*)   ? Eosinophils Absolute 1.0 (*)   ? All other components within normal limits  ?COMPREHENSIVE METABOLIC PANEL - Abnormal; Notable for the following components:  ? Potassium 3.2 (*)   ? Chloride 114 (*)   ? CO2 19 (*)   ? BUN 33 (*)   ?  Creatinine, Ser 1.16 (*)   ? Calcium 8.5 (*)   ? Total Protein 8.4 (*)   ? Albumin 3.0 (*)   ? AST 55 (*)   ? Alkaline Phosphatase 190 (*)   ? GFR, Estimated 50 (*)   ? All other components within normal limits  ?I-STAT VENOUS BLOOD GAS, ED -  Abnormal; Notable for the following components:  ? pCO2, Ven 39.8 (*)   ? pO2, Ven 23 (*)   ? Bicarbonate 18.4 (*)   ? TCO2 20 (*)   ? Acid-base deficit 8.0 (*)   ? HCT 34.0 (*)   ? Hemoglobin 11.6 (*)   ? All other components within normal limits  ? ? ?EKG ?None ? ?Radiology ?No results found. ? ?Procedures ?Procedures  ? ? ?Medications Ordered in ED ?Medications  ?potassium chloride 10 mEq in 100 mL IVPB (10 mEq Intravenous New Bag/Given 08/15/21 1920)  ?sodium chloride 0.9 % bolus 1,000 mL (1,000 mLs Intravenous New Bag/Given 08/15/21 1837)  ?potassium chloride SA (KLOR-CON M) CR tablet 40 mEq (40 mEq Oral Given 08/15/21 1919)  ? ? ?ED Course/ Medical Decision Making/ A&P ?  ?                        ?Medical Decision Making ?Amount and/or Complexity of Data Reviewed ?Labs: ordered. ? ?Risk ?Prescription drug management. ? ? ?Mieka C Lawn is here for repeat lab work.  Normal vitals.  No fever.  History of short gut syndrome on TPN.  Had some basic screening labs done several days ago and was sent for repeat labs as her CO2 is 17.  She has had some mild diarrhea but no major nausea or vomiting.  No chest pain, fever, chills.  No abdominal pain.  She has been doing TPN through her central line.  She denies any chest pain, shortness of breath.  States that in the past she has had intermittent low CO2 use.  Per chart review she does wax and wane with her bicarb.  Overall suspect that this is secondary to nutritional process.  We will rule out DKA and other severe metabolic derangements with lab work. ? ?Blood gas shows a pH is 7.27.  Potassium is 3.5 on blood gas.  Awaiting CBC and CMP.  IV fluid bolus has been ordered. ? ?Potassium on metabolic panel is 3.2.  Given IV and oral  repletion.  Creatinine 1.16.  Bicarb improved to 19.  Overall she is not in DKA.  Suspect some GI losses leading to low bicarb but has been improved from bicarb of 17 from 2 days ago.  She is given IV fluids.  We will

## 2021-08-15 NOTE — Telephone Encounter (Signed)
Pt made aware of Dr. Carlean Purl recommendations to go the the ED for evaluation for the Metabolic Acidosis : Pt stated that she was going to give herself an TPN infusion and after that she will proceed to the ED. Pt was encouraged to go to the ED asap  although pt stated that she did not want to go along time with out her infusion and they typically take along time in the hospital to order the TPN: Pt was encouraged to go to the ED asap:  ?Pt stated that after her infusion is complete then she will proceed to the ED ?[name] [name] made aware  ?Pt verbalized understanding with all questions answered.  ? ?  ?

## 2021-08-15 NOTE — ED Notes (Signed)
Patient ambulated to restroom.

## 2021-08-15 NOTE — Telephone Encounter (Signed)
Spoke with Ermalinda Barrios from [place]: Ermalinda Barrios stated that she feels that the Pt may be in Metabolic Acidosis and maybe need to go to the ED for IV fluids with Potassium: Ermalinda Barrios stated that she is concerned about the pt CO2, Potassium, BUN: ?Please advise  ?

## 2021-08-19 ENCOUNTER — Encounter: Payer: Self-pay | Admitting: Internal Medicine

## 2021-08-19 ENCOUNTER — Other Ambulatory Visit (INDEPENDENT_AMBULATORY_CARE_PROVIDER_SITE_OTHER): Payer: Medicare Other

## 2021-08-19 ENCOUNTER — Other Ambulatory Visit: Payer: Self-pay | Admitting: Internal Medicine

## 2021-08-19 ENCOUNTER — Telehealth: Payer: Self-pay | Admitting: Internal Medicine

## 2021-08-19 ENCOUNTER — Telehealth: Payer: Self-pay

## 2021-08-19 ENCOUNTER — Other Ambulatory Visit: Payer: Self-pay

## 2021-08-19 DIAGNOSIS — K760 Fatty (change of) liver, not elsewhere classified: Secondary | ICD-10-CM

## 2021-08-19 DIAGNOSIS — Z789 Other specified health status: Secondary | ICD-10-CM

## 2021-08-19 DIAGNOSIS — K912 Postsurgical malabsorption, not elsewhere classified: Secondary | ICD-10-CM

## 2021-08-19 LAB — COMPREHENSIVE METABOLIC PANEL
ALT: 27 U/L (ref 0–35)
AST: 29 U/L (ref 0–37)
Albumin: 3.3 g/dL — ABNORMAL LOW (ref 3.5–5.2)
Alkaline Phosphatase: 194 U/L — ABNORMAL HIGH (ref 39–117)
BUN: 32 mg/dL — ABNORMAL HIGH (ref 6–23)
CO2: 19 mEq/L (ref 19–32)
Calcium: 8.4 mg/dL (ref 8.4–10.5)
Chloride: 109 mEq/L (ref 96–112)
Creatinine, Ser: 1.15 mg/dL (ref 0.40–1.20)
GFR: 48 mL/min — ABNORMAL LOW (ref >60.00)
Glucose, Bld: 102 mg/dL — ABNORMAL HIGH (ref 70–99)
Potassium: 4 mEq/L (ref 3.5–5.1)
Sodium: 134 mEq/L — ABNORMAL LOW (ref 135–145)
Total Bilirubin: 0.5 mg/dL (ref 0.2–1.2)
Total Protein: 7.9 g/dL (ref 6.0–8.3)

## 2021-08-19 LAB — CBC WITH DIFFERENTIAL/PLATELET
Basophils Absolute: 0 10*3/uL (ref 0.0–0.1)
Basophils Relative: 1 % (ref 0.0–3.0)
Eosinophils Absolute: 0.6 10*3/uL (ref 0.0–0.7)
Eosinophils Relative: 13.3 % — ABNORMAL HIGH (ref 0.0–5.0)
HCT: 29.5 % — ABNORMAL LOW (ref 36.0–46.0)
Hemoglobin: 9.5 g/dL — ABNORMAL LOW (ref 12.0–15.0)
Lymphocytes Relative: 35.9 % (ref 12.0–46.0)
Lymphs Abs: 1.5 10*3/uL (ref 0.7–4.0)
MCHC: 32.3 g/dL (ref 30.0–36.0)
MCV: 92.4 fl (ref 78.0–100.0)
Monocytes Absolute: 0.6 10*3/uL (ref 0.1–1.0)
Monocytes Relative: 15.3 % — ABNORMAL HIGH (ref 3.0–12.0)
Neutro Abs: 1.5 10*3/uL (ref 1.4–7.7)
Neutrophils Relative %: 34.5 % — ABNORMAL LOW (ref 43.0–77.0)
Platelets: 69 10*3/uL — ABNORMAL LOW (ref 150.0–400.0)
RBC: 3.19 Mil/uL — ABNORMAL LOW (ref 3.87–5.11)
RDW: 18.1 % — ABNORMAL HIGH (ref 11.5–15.5)
WBC: 4.3 10*3/uL (ref 4.0–10.5)

## 2021-08-19 LAB — PHOSPHORUS: Phosphorus: 2.7 mg/dL (ref 2.3–4.6)

## 2021-08-19 LAB — MAGNESIUM: Magnesium: 1.9 mg/dL (ref 1.5–2.5)

## 2021-08-19 NOTE — Telephone Encounter (Signed)
Orders for labs placed in Epic: ?Pt made aware to come today and have labs drawn:  ?Pt verbalized understanding with all questions answered.  ? ?

## 2021-08-19 NOTE — Telephone Encounter (Signed)
[name] [name] from [place] stated that she is requesting the Following Labs be done today: CMP, Magnesium, Phosphorus:  ?Please advise: ?

## 2021-08-19 NOTE — Telephone Encounter (Signed)
-----   Message from Gatha Mayer, MD sent at 08/19/2021  7:57 AM EDT ----- ?Regarding: Labs today ?Please arrange for Clarrissa to do labs today - the TPN coordinator had emailed and I think will call you also ? ? ?CMET ?Magnesium ?Phosphorus ?CBC ? ?Thanks ? ?[name] ? ?

## 2021-08-19 NOTE — Telephone Encounter (Signed)
Staff Message reviewed: Orders for labs placed in Epic: pt made aware ?Pt verbalized understanding with all questions answered.  ? ?

## 2021-08-19 NOTE — Telephone Encounter (Signed)
Inbound call from [place] at speciality pharmacy for patient TPN. Would like a call back to clarify lab orders for patient. States patient is coming in for labs and want to be sure they are ordered. Best contact [phone] ex [phone] ?

## 2021-08-19 NOTE — Telephone Encounter (Signed)
Yes - please do them ?I thought I had sent a staff message about this also? ? ? ?

## 2021-08-20 NOTE — Telephone Encounter (Signed)
Elana from [place] called following up on Fax that she sent earlier to [phone] for Dr. Carlean Purl : Fax not found: Jiles Garter notified to fax to the following number: [phone] ?Elana also requesting that pt have the following  labs drawn on 09/02/2021: ?CMP ?CBC ?Magnesium ?Phosphorus ?Prealbumin ?Carnitine ?PTH ?Please advise: ? ?  ?

## 2021-08-20 NOTE — Telephone Encounter (Signed)
[name] from [place] is calling you back. [place] [phone] ?

## 2021-08-21 ENCOUNTER — Other Ambulatory Visit: Payer: Self-pay | Admitting: Internal Medicine

## 2021-08-21 ENCOUNTER — Other Ambulatory Visit: Payer: Self-pay

## 2021-08-21 ENCOUNTER — Encounter: Payer: Self-pay | Admitting: Internal Medicine

## 2021-08-21 DIAGNOSIS — Z789 Other specified health status: Secondary | ICD-10-CM

## 2021-08-21 DIAGNOSIS — K912 Postsurgical malabsorption, not elsewhere classified: Secondary | ICD-10-CM

## 2021-08-21 DIAGNOSIS — K760 Fatty (change of) liver, not elsewhere classified: Secondary | ICD-10-CM

## 2021-08-21 LAB — ZINC: Zinc: 50 ug/dL — ABNORMAL LOW (ref 60–130)

## 2021-08-21 LAB — COPPER, SERUM: Copper: 63 ug/dL — ABNORMAL LOW (ref 70–175)

## 2021-08-21 LAB — VITAMIN E
Gamma-Tocopherol (Vit E): 1.6 mg/L (ref <=4.3)
Vitamin E (Alpha Tocopherol): 9 mg/L (ref 5.7–19.9)

## 2021-08-21 LAB — PREALBUMIN: Prealbumin: 20 mg/dL (ref 17–34)

## 2021-08-21 LAB — VITAMIN A: Vitamin A (Retinoic Acid): 37 ug/dL — ABNORMAL LOW (ref 38–98)

## 2021-08-21 LAB — SELENIUM SERUM: Selenium, Blood: 101 mcg/L (ref 63–160)

## 2021-08-21 MED ORDER — ERGOCALCIFEROL 200 MCG/ML PO SOLN: 48000.0000 [IU] | ORAL | 3 refills | Status: DC

## 2021-08-21 NOTE — Telephone Encounter (Signed)
OK to order labs ?

## 2021-08-21 NOTE — Telephone Encounter (Signed)
Labs ordered through Epic ?Pt made aware to have labs drawn on Yanin 24th: ?Pt stated that she has a question in regard to two procedure's that she will be having on the same week: ?Cataract surgery on May 9th ?EGD/Colon on May 12th ?Pt stated that the office from the Cataract Surgery stated that they will not put her to sleep twice in the same week: (Anesthesia) ?Please advise  ?

## 2021-08-21 NOTE — Telephone Encounter (Signed)
I called the patient and asked her to specifically ask her ophthalmologist if it is okay to have the endoscopy and colonoscopy 3 days after her cataract surgery.  If not we will move it. ?

## 2021-08-22 ENCOUNTER — Inpatient Hospital Stay: Payer: Medicare Other

## 2021-08-22 ENCOUNTER — Other Ambulatory Visit: Payer: Self-pay | Admitting: *Deleted

## 2021-08-22 DIAGNOSIS — D696 Thrombocytopenia, unspecified: Secondary | ICD-10-CM

## 2021-08-22 DIAGNOSIS — D638 Anemia in other chronic diseases classified elsewhere: Secondary | ICD-10-CM

## 2021-08-23 ENCOUNTER — Telehealth: Payer: Self-pay | Admitting: Internal Medicine

## 2021-08-23 NOTE — Telephone Encounter (Signed)
Left message for pt to call back  °

## 2021-08-23 NOTE — Telephone Encounter (Signed)
Pt contacted: Note documented in another phone note:  ?

## 2021-08-23 NOTE — Telephone Encounter (Signed)
Pt was followed up on with previous conversation regarding eye procedure and EGD/Colon procedure scheduled during the same week: ?Pt stated that her Eye Dr Gabriel Carina called her today and stated that they had a cancellation for next week. Pt is scheduled for an appointment for the eye surgery on 08/27/2021:  ?Pt notified to proceed with EGD/Colon as scheduled:  ?Pt verbalized understanding with all questions answered.  ?    ?

## 2021-08-23 NOTE — Telephone Encounter (Signed)
Patient is returning your phone call.  Thank you. ?

## 2021-08-27 DIAGNOSIS — H25012 Cortical age-related cataract, left eye: Secondary | ICD-10-CM | POA: Diagnosis not present

## 2021-08-27 DIAGNOSIS — H25812 Combined forms of age-related cataract, left eye: Secondary | ICD-10-CM | POA: Diagnosis not present

## 2021-08-27 DIAGNOSIS — H25042 Posterior subcapsular polar age-related cataract, left eye: Secondary | ICD-10-CM | POA: Diagnosis not present

## 2021-08-27 DIAGNOSIS — H2512 Age-related nuclear cataract, left eye: Secondary | ICD-10-CM | POA: Diagnosis not present

## 2021-08-28 ENCOUNTER — Other Ambulatory Visit (HOSPITAL_COMMUNITY): Payer: Self-pay | Admitting: Internal Medicine

## 2021-08-29 NOTE — Progress Notes (Signed)
? ?Patient Care Team: ?[name], MD as PCP - General (Family Medicine) ?Gatha Mayer, MD as Consulting Physician (Gastroenterology) ?Nicholas Lose, MD as Consulting Physician (Hematology and Oncology) ?Madelin Rear, [facility] as Pharmacist (Pharmacist) ? ?DIAGNOSIS:  ?Encounter Diagnosis  ?Name Primary?  ? Thrombocytopenia ([place])   ? ? CHIEF COMPLIANT: Follow-up for thrombocytopenia and anemia ? ?INTERVAL HISTORY: [name] [name] is a 71 y.o. with above-mentioned history of thrombocytopenia and anemia. She presents to the clinic today for follow-up.  She has been getting extremely fatigued over the past month.  She went to the emergency room on 08/15/2021 and was given IV fluids.  Over the past month her hemoglobin has slowly declined.  She is feeling short of breath and tired.  Denies any dizziness or lightheadedness.  She is scheduled to undergo upper endoscopy and colonoscopy by Dr. Carlean Purl on 09/20/2021. ? ? ?ALLERGIES:  has No Known Allergies. ? ?MEDICATIONS:  ?Current Outpatient Medications  ?Medication Sig Dispense Refill  ? acetaminophen (TYLENOL) 500 MG tablet Take 1,000 mg by mouth daily as needed for mild pain.    ? ADULT TPN Inject 1,800 mLs into the vein 4 (four) times a week. Pt receives home TPN from [facility]:  1800 mL bag, four nights weekly (Monday, Tuesday, Wednesday, Thursday for 8 hours (includes 1 hour taper up and down).    ? baclofen (LIORESAL) 10 MG tablet Take 1 tablet (10 mg total) by mouth 2 (two) times daily as needed for muscle spasms. 60 each 4  ? Calcium Carb-Cholecalciferol (CALCIUM + D3 PO) Take 2 tablets by mouth daily.    ? cetirizine (ZYRTEC) 10 MG tablet Take 1 tablet (10 mg total) by mouth at bedtime as needed for allergies. 90 tablet 1  ? clobetasol ointment (TEMOVATE) 2.58 % Apply 1 application topically 2 (two) times daily.    ? diphenhydrAMINE (BENADRYL) 25 mg capsule Take 25-50 mg by mouth every 6 (six) hours as needed for itching or sleep.     ? empagliflozin  (JARDIANCE) 10 MG TABS tablet Take 1 tablet (10 mg total) by mouth daily before breakfast. 90 tablet 3  ? ergocalciferol (DRISDOL) 200 MCG/ML drops Take 6 mLs (48,000 Units total) by mouth 3 (three) times a week. 720 mL 3  ? fluticasone (FLONASE) 50 MCG/ACT nasal spray Place 2 sprays into both nostrils daily as needed for allergies or rhinitis.    ? furosemide (LASIX) 20 MG tablet TAKE ONE TABLET BY MOUTH AS NEEDED FOR SWELLING 45 tablet 1  ? Heparin Sodium, Porcine, (HEPARIN LOCK FLUSH IJ) Inject 5 mLs as directed 4 (four) times a week. Monday, Tuesday, Wednesday, Thursday in the morning with TPN    ? losartan (COZAAR) 25 MG tablet Take 0.5 tablets (12.5 mg total) by mouth at bedtime. 90 tablet 3  ? meclizine (ANTIVERT) 25 MG tablet Take 1 tablet (25 mg total) by mouth 3 (three) times daily as needed for dizziness. 30 tablet 0  ? Multiple Vitamins-Minerals (ADULT GUMMY PO) Take 2 tablets by mouth daily.    ? omeprazole (PRILOSEC) 40 MG capsule TAKE 1 CAPSULE BY MOUTH EVERY DAY BEFORE BREAKFAST 90 capsule 1  ? ondansetron (ZOFRAN-ODT) 8 MG disintegrating tablet TAKE 1 TABLET BY MOUTH EVERY 8 HOURS AS NEEDED FOR NAUSEA OR VOMITING. 30 tablet 11  ? potassium chloride SA (KLOR-CON M) 20 MEQ tablet Take 1 tablet (20 mEq total) by mouth 2 (two) times daily for 4 days. 8 tablet 0  ? sodium chloride 0.9 %  infusion Inject 900 mLs into the vein every 14 (fourteen) days. Uses for TPN    ? spironolactone (ALDACTONE) 25 MG tablet TAKE 1 TABLET (25 MG TOTAL) BY MOUTH DAILY. 90 tablet 3  ? Teduglutide, rDNA, 5 MG KIT Inject 3.1 application into the skin every morning.    ? triamcinolone cream (KENALOG) 0.1 % Apply 1 application topically as needed (itching).    ? vitamin E 1000 UNIT capsule Take 1,000 Units by mouth daily.    ? XARELTO 20 MG TABS tablet TAKE 1 TABLET BY MOUTH DAILY WITH SUPPER. 90 tablet 2  ? ?No current facility-administered medications for this visit.  ? ? ?PHYSICAL EXAMINATION: ?ECOG PERFORMANCE STATUS: 2 -  Symptomatic, <50% confined to bed ? ?Vitals:  ? 09/12/21 1446  ?BP: 118/64  ?Pulse: 75  ?Resp: 18  ?Temp: 97.9 ?F (36.6 ?C)  ?SpO2: 100%  ? ?Filed Weights  ? 09/12/21 1446  ?Weight: 145 lb 1.6 oz (65.8 kg)  ? ?  ? ?LABORATORY DATA:  ?I have reviewed the data as listed ? ?  Latest Ref Rng & Units 09/10/2021  ?  2:11 PM 09/02/2021  ?  9:14 AM 08/19/2021  ? 11:34 AM  ?CMP  ?Glucose 70 - 99 mg/dL 110   97   102    ?BUN 8 - 23 mg/dL 28   30   32    ?Creatinine 0.44 - 1.00 mg/dL 1.17   1.17   1.15    ?Sodium 135 - 145 mmol/L 137   133   134    ?Potassium 3.5 - 5.1 mmol/L 4.1   3.7   4.0    ?Chloride 98 - 111 mmol/L 111   108   109    ?CO2 22 - 32 mmol/L $RemoveB'19   18   19    'dElYXlLJ$ ?Calcium 8.9 - 10.3 mg/dL 8.4   8.2    ? 8.2   8.4    ?Total Protein 6.5 - 8.1 g/dL 7.9   7.9   7.9    ?Total Bilirubin 0.3 - 1.2 mg/dL 0.3   0.4   0.5    ?Alkaline Phos 38 - 126 U/L 181   192   194    ?AST 15 - 41 U/L 24   52   29    ?ALT 0 - 44 U/L 26   38   27    ? ? ?Lab Results  ?Component Value Date  ? WBC 5.2 09/10/2021  ? HGB 8.9 (L) 09/10/2021  ? HCT 27.2 (L) 09/10/2021  ? MCV 91.6 09/10/2021  ? PLT 80 (L) 09/10/2021  ? NEUTROABS 2.4 09/10/2021  ? ? 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 She received last Aranesp injection February 2016. She had required a total of 3 Aranesp injections in 2015, while she received at least 10 injections in 2014. So we discontinued therapy. ?  ?Current treatment:  Reinitiated Aranesp 10/26/2020.  Also received July 2022.  Holding August 2022 (hemoglobin 10.7) ?  ?Short bowel syndrome on Gattex, Teduglutide she is currently on TPN 3 nights a week She had surgery on her small intestine for blood clots in 2006 and since then she has short gut syndrome. This is the reason for her chronic disease. ?  ?Patient's baseline platelet count is around 100 ?09/17/2020: Hemoglobin 9.7, platelets 92 ?10/10/2020: Hemoglobin  8.5, platelets 52 ?09/10/2021: Hemoglobin 8.9, platelets 80, creatinine  1.17, albumin 3.1, iron saturation 9%, ferritin 72 ? ?It is unclear how her hemoglobin declined within 4 weeks from 10.1-8.9. ?Based on low iron saturation I recommended that we give IV iron therapy ? ?She comes every 3 weeks for Aranesp injections and has an appointment to see Mendel Ryder in June. ?She has an appointment for upper endoscopy and colonoscopy on 09/20/2021 with Dr. Carlean Purl. ? ? ?No orders of the defined types were placed in this encounter. ? ?The patient has a good understanding of the overall plan. she agrees with it. she will call with any problems that may develop before the next visit here. ?Total time spent: 30 mins including face to face time and time spent for planning, charting and co-ordination of care ? ? Harriette Ohara, MD ?09/12/21 ? ? ? I Gardiner Coins am scribing for Dr. Lindi Adie ? ?I have reviewed the above documentation for accuracy and completeness, and I agree with the above. ?  ?

## 2021-08-30 ENCOUNTER — Other Ambulatory Visit: Payer: Self-pay | Admitting: Family Medicine

## 2021-09-02 ENCOUNTER — Other Ambulatory Visit: Payer: Medicare Other

## 2021-09-02 ENCOUNTER — Other Ambulatory Visit (INDEPENDENT_AMBULATORY_CARE_PROVIDER_SITE_OTHER): Payer: Medicare Other

## 2021-09-02 DIAGNOSIS — K912 Postsurgical malabsorption, not elsewhere classified: Secondary | ICD-10-CM | POA: Diagnosis not present

## 2021-09-02 DIAGNOSIS — Z789 Other specified health status: Secondary | ICD-10-CM

## 2021-09-02 DIAGNOSIS — K760 Fatty (change of) liver, not elsewhere classified: Secondary | ICD-10-CM

## 2021-09-02 LAB — COMPREHENSIVE METABOLIC PANEL
ALT: 38 U/L — ABNORMAL HIGH (ref 0–35)
AST: 52 U/L — ABNORMAL HIGH (ref 0–37)
Albumin: 3.2 g/dL — ABNORMAL LOW (ref 3.5–5.2)
Alkaline Phosphatase: 192 U/L — ABNORMAL HIGH (ref 39–117)
BUN: 30 mg/dL — ABNORMAL HIGH (ref 6–23)
CO2: 18 mEq/L — ABNORMAL LOW (ref 19–32)
Calcium: 8.2 mg/dL — ABNORMAL LOW (ref 8.4–10.5)
Chloride: 108 mEq/L (ref 96–112)
Creatinine, Ser: 1.17 mg/dL (ref 0.40–1.20)
GFR: 47 mL/min — ABNORMAL LOW (ref >60.00)
Glucose, Bld: 97 mg/dL (ref 70–99)
Potassium: 3.7 mEq/L (ref 3.5–5.1)
Sodium: 133 mEq/L — ABNORMAL LOW (ref 135–145)
Total Bilirubin: 0.4 mg/dL (ref 0.2–1.2)
Total Protein: 7.9 g/dL (ref 6.0–8.3)

## 2021-09-02 LAB — CBC WITH DIFFERENTIAL/PLATELET
Basophils Absolute: 0 10*3/uL (ref 0.0–0.1)
Basophils Relative: 0.7 % (ref 0.0–3.0)
Eosinophils Absolute: 0.2 10*3/uL (ref 0.0–0.7)
Eosinophils Relative: 3.9 % (ref 0.0–5.0)
HCT: 28.4 % — ABNORMAL LOW (ref 36.0–46.0)
Hemoglobin: 9.3 g/dL — ABNORMAL LOW (ref 12.0–15.0)
Lymphocytes Relative: 21.1 % (ref 12.0–46.0)
Lymphs Abs: 1.1 10*3/uL (ref 0.7–4.0)
MCHC: 32.8 g/dL (ref 30.0–36.0)
MCV: 92.6 fl (ref 78.0–100.0)
Monocytes Absolute: 0.5 10*3/uL (ref 0.1–1.0)
Monocytes Relative: 9.7 % (ref 3.0–12.0)
Neutro Abs: 3.3 10*3/uL (ref 1.4–7.7)
Neutrophils Relative %: 64.6 % (ref 43.0–77.0)
Platelets: 69 10*3/uL — ABNORMAL LOW (ref 150.0–400.0)
RBC: 3.07 Mil/uL — ABNORMAL LOW (ref 3.87–5.11)
RDW: 17.3 % — ABNORMAL HIGH (ref 11.5–15.5)
WBC: 5.1 10*3/uL (ref 4.0–10.5)

## 2021-09-02 LAB — PHOSPHORUS: Phosphorus: 2.6 mg/dL (ref 2.3–4.6)

## 2021-09-02 LAB — MAGNESIUM: Magnesium: 1.9 mg/dL (ref 1.5–2.5)

## 2021-09-03 ENCOUNTER — Telehealth: Payer: Self-pay | Admitting: Internal Medicine

## 2021-09-03 LAB — PTH, INTACT AND CALCIUM
Calcium: 8.2 mg/dL — ABNORMAL LOW (ref 8.6–10.4)
PTH: 55 pg/mL (ref 16–77)

## 2021-09-03 LAB — PREALBUMIN: Prealbumin: 16 mg/dL — ABNORMAL LOW (ref 17–34)

## 2021-09-03 NOTE — Telephone Encounter (Signed)
Inbound call from [name] [name] at [facility]. States she faxed over orders to change TPN. If have any questions can be reached at [phone] ext [phone] ?

## 2021-09-06 ENCOUNTER — Other Ambulatory Visit: Payer: Self-pay

## 2021-09-06 DIAGNOSIS — K760 Fatty (change of) liver, not elsewhere classified: Secondary | ICD-10-CM

## 2021-09-06 DIAGNOSIS — K912 Postsurgical malabsorption, not elsewhere classified: Secondary | ICD-10-CM

## 2021-09-06 DIAGNOSIS — D638 Anemia in other chronic diseases classified elsewhere: Secondary | ICD-10-CM

## 2021-09-06 DIAGNOSIS — Z789 Other specified health status: Secondary | ICD-10-CM

## 2021-09-09 ENCOUNTER — Other Ambulatory Visit: Payer: Self-pay | Admitting: Internal Medicine

## 2021-09-10 ENCOUNTER — Inpatient Hospital Stay: Payer: Medicare Other | Attending: Hematology and Oncology

## 2021-09-10 ENCOUNTER — Other Ambulatory Visit: Payer: Self-pay

## 2021-09-10 DIAGNOSIS — D631 Anemia in chronic kidney disease: Secondary | ICD-10-CM | POA: Insufficient documentation

## 2021-09-10 DIAGNOSIS — N1831 Chronic kidney disease, stage 3a: Secondary | ICD-10-CM | POA: Insufficient documentation

## 2021-09-10 DIAGNOSIS — D638 Anemia in other chronic diseases classified elsewhere: Secondary | ICD-10-CM

## 2021-09-10 DIAGNOSIS — D696 Thrombocytopenia, unspecified: Secondary | ICD-10-CM | POA: Insufficient documentation

## 2021-09-10 DIAGNOSIS — Z20822 Contact with and (suspected) exposure to covid-19: Secondary | ICD-10-CM | POA: Diagnosis not present

## 2021-09-10 LAB — CBC WITH DIFFERENTIAL (CANCER CENTER ONLY)
Abs Immature Granulocytes: 0.02 10*3/uL (ref 0.00–0.07)
Basophils Absolute: 0 10*3/uL (ref 0.0–0.1)
Basophils Relative: 0 %
Eosinophils Absolute: 0.4 10*3/uL (ref 0.0–0.5)
Eosinophils Relative: 7 %
HCT: 27.2 % — ABNORMAL LOW (ref 36.0–46.0)
Hemoglobin: 8.9 g/dL — ABNORMAL LOW (ref 12.0–15.0)
Immature Granulocytes: 0 %
Lymphocytes Relative: 34 %
Lymphs Abs: 1.8 10*3/uL (ref 0.7–4.0)
MCH: 30 pg (ref 26.0–34.0)
MCHC: 32.7 g/dL (ref 30.0–36.0)
MCV: 91.6 fL (ref 80.0–100.0)
Monocytes Absolute: 0.7 10*3/uL (ref 0.1–1.0)
Monocytes Relative: 13 %
Neutro Abs: 2.4 10*3/uL (ref 1.7–7.7)
Neutrophils Relative %: 46 %
Platelet Count: 80 10*3/uL — ABNORMAL LOW (ref 150–400)
RBC: 2.97 MIL/uL — ABNORMAL LOW (ref 3.87–5.11)
RDW: 16.6 % — ABNORMAL HIGH (ref 11.5–15.5)
WBC Count: 5.2 10*3/uL (ref 4.0–10.5)
nRBC: 0 % (ref 0.0–0.2)

## 2021-09-10 LAB — IRON AND IRON BINDING CAPACITY (CC-WL,HP ONLY)
Iron: 29 ug/dL (ref 28–170)
Saturation Ratios: 9 % — ABNORMAL LOW (ref 10.4–31.8)
TIBC: 325 ug/dL (ref 250–450)
UIBC: 296 ug/dL (ref 148–442)

## 2021-09-10 LAB — CMP (CANCER CENTER ONLY)
ALT: 26 U/L (ref 0–44)
AST: 24 U/L (ref 15–41)
Albumin: 3.1 g/dL — ABNORMAL LOW (ref 3.5–5.0)
Alkaline Phosphatase: 181 U/L — ABNORMAL HIGH (ref 38–126)
Anion gap: 7 (ref 5–15)
BUN: 28 mg/dL — ABNORMAL HIGH (ref 8–23)
CO2: 19 mmol/L — ABNORMAL LOW (ref 22–32)
Calcium: 8.4 mg/dL — ABNORMAL LOW (ref 8.9–10.3)
Chloride: 111 mmol/L (ref 98–111)
Creatinine: 1.17 mg/dL — ABNORMAL HIGH (ref 0.44–1.00)
GFR, Estimated: 50 mL/min — ABNORMAL LOW (ref >60)
Glucose, Bld: 110 mg/dL — ABNORMAL HIGH (ref 70–99)
Potassium: 4.1 mmol/L (ref 3.5–5.1)
Sodium: 137 mmol/L (ref 135–145)
Total Bilirubin: 0.3 mg/dL (ref 0.3–1.2)
Total Protein: 7.9 g/dL (ref 6.5–8.1)

## 2021-09-10 LAB — FERRITIN: Ferritin: 72 ng/mL (ref 11–307)

## 2021-09-12 ENCOUNTER — Inpatient Hospital Stay: Payer: Medicare Other

## 2021-09-12 ENCOUNTER — Other Ambulatory Visit: Payer: Medicare Other

## 2021-09-12 ENCOUNTER — Other Ambulatory Visit: Payer: Self-pay

## 2021-09-12 ENCOUNTER — Inpatient Hospital Stay (HOSPITAL_BASED_OUTPATIENT_CLINIC_OR_DEPARTMENT_OTHER): Payer: Medicare Other | Admitting: Hematology and Oncology

## 2021-09-12 DIAGNOSIS — D696 Thrombocytopenia, unspecified: Secondary | ICD-10-CM

## 2021-09-12 DIAGNOSIS — N1831 Chronic kidney disease, stage 3a: Secondary | ICD-10-CM | POA: Diagnosis not present

## 2021-09-12 DIAGNOSIS — D638 Anemia in other chronic diseases classified elsewhere: Secondary | ICD-10-CM

## 2021-09-12 DIAGNOSIS — D631 Anemia in chronic kidney disease: Secondary | ICD-10-CM | POA: Diagnosis not present

## 2021-09-12 MED ORDER — DARBEPOETIN ALFA 300 MCG/0.6ML IJ SOSY
300.0000 ug | PREFILLED_SYRINGE | Freq: Once | INTRAMUSCULAR | Status: AC
  Administered 2021-09-12: 300 ug via SUBCUTANEOUS
  Filled 2021-09-12: qty 0.6

## 2021-09-12 NOTE — Assessment & Plan Note (Signed)
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 She received last Aranesp injection February 2016. She had?required a total of 3 Aranesp injections in 2015, while she received at least 10 injections in 2014. So we discontinued therapy. ?? ?Current treatment:??Reinitiated?Aranesp 10/26/2020. ?Also received July 2022. ?Holding August 2022 (hemoglobin 10.7) ?? ?Short bowel syndrome?on Gattex, Teduglutide she is currently on TPN 3 nights a week She had surgery on her small intestine for blood clots in 2006 and since then she has short gut syndrome. This is the reason for her chronic disease. ?? ?Patient's baseline platelet count is around 100 ?09/17/2020: Hemoglobin 9.7, platelets 92 ?10/10/2020: Hemoglobin 8.5, platelets 52 ?10/22/2020:?Hemoglobin 8.5, platelets 92 ?11/01/2020: Hemoglobin 9.8, platelets 104 ?12/19/2020: Hemoglobin 10.7, platelets 98 ?06/21/2021: Hemoglobin 7.8, platelets 92, MCV 93, (1 unit PRBC and Aranesp) ?07/22/2021: Hemoglobin 10, MCV 91.7, RDW 18.6, platelets 83 ?08/01/2021: Hemoglobin 9.8, platelets 80 ?09/10/2021: Hemoglobin 8.9, platelets 80, creatinine 1.17, albumin 3.1, iron saturation 9%, ferritin 72 ? ?It is unclear how her hemoglobin declined within 4 weeks from 10.1-8.9. ?Based on low iron saturation I recommended that we give IV iron therapy ? ?Recheck labs in 3 months and follow-up ? ?

## 2021-09-16 ENCOUNTER — Inpatient Hospital Stay: Payer: Medicare Other

## 2021-09-16 ENCOUNTER — Other Ambulatory Visit: Payer: Self-pay

## 2021-09-16 ENCOUNTER — Ambulatory Visit: Payer: Medicare Other

## 2021-09-16 VITALS — BP 126/75 | HR 66 | Temp 98.0°F | Resp 18

## 2021-09-16 DIAGNOSIS — D696 Thrombocytopenia, unspecified: Secondary | ICD-10-CM | POA: Diagnosis not present

## 2021-09-16 DIAGNOSIS — N1831 Chronic kidney disease, stage 3a: Secondary | ICD-10-CM | POA: Diagnosis not present

## 2021-09-16 DIAGNOSIS — D631 Anemia in chronic kidney disease: Secondary | ICD-10-CM | POA: Diagnosis not present

## 2021-09-16 DIAGNOSIS — I6523 Occlusion and stenosis of bilateral carotid arteries: Secondary | ICD-10-CM

## 2021-09-16 DIAGNOSIS — D638 Anemia in other chronic diseases classified elsewhere: Secondary | ICD-10-CM

## 2021-09-16 MED ORDER — SODIUM CHLORIDE 0.9 % IV SOLN
400.0000 mg | Freq: Once | INTRAVENOUS | Status: AC
  Administered 2021-09-16: 400 mg via INTRAVENOUS
  Filled 2021-09-16: qty 20

## 2021-09-16 MED ORDER — SODIUM CHLORIDE 0.9 % IV SOLN: Freq: Once | INTRAVENOUS | Status: AC

## 2021-09-16 NOTE — Patient Instructions (Signed)

## 2021-09-16 NOTE — Progress Notes (Signed)
Patient observed for 30 minutes following administration of Venofer infusion. Vital signs retaken and remained stable. Patient showed no signs of distress upon discharge.  

## 2021-09-17 ENCOUNTER — Telehealth: Payer: Self-pay | Admitting: Internal Medicine

## 2021-09-17 NOTE — Telephone Encounter (Addendum)
Inbound call from [place] from [place] stating she would like call back to discuss patient. Please advise.  ? ?[phone] ?Ext [phone] ?

## 2021-09-17 NOTE — Telephone Encounter (Signed)
Spoke with [name] [name] from [place] requesting that pt get labs done. [name] [name] was contacted and reminded of labs that needed to be collected: Pt stated that she will come tomorrow to get labs drawn. ?Pt verbalized understanding with all questions answered.  ? ?

## 2021-09-18 ENCOUNTER — Other Ambulatory Visit (INDEPENDENT_AMBULATORY_CARE_PROVIDER_SITE_OTHER): Payer: Medicare Other

## 2021-09-18 DIAGNOSIS — D638 Anemia in other chronic diseases classified elsewhere: Secondary | ICD-10-CM | POA: Diagnosis not present

## 2021-09-18 DIAGNOSIS — K912 Postsurgical malabsorption, not elsewhere classified: Secondary | ICD-10-CM | POA: Diagnosis not present

## 2021-09-18 DIAGNOSIS — K760 Fatty (change of) liver, not elsewhere classified: Secondary | ICD-10-CM

## 2021-09-18 DIAGNOSIS — K90829 Short bowel syndrome, unspecified: Secondary | ICD-10-CM

## 2021-09-18 DIAGNOSIS — Z789 Other specified health status: Secondary | ICD-10-CM

## 2021-09-18 LAB — COMPREHENSIVE METABOLIC PANEL
ALT: 19 U/L (ref 0–35)
AST: 19 U/L (ref 0–37)
Albumin: 3.2 g/dL — ABNORMAL LOW (ref 3.5–5.2)
Alkaline Phosphatase: 180 U/L — ABNORMAL HIGH (ref 39–117)
BUN: 28 mg/dL — ABNORMAL HIGH (ref 6–23)
CO2: 22 mEq/L (ref 19–32)
Calcium: 8.4 mg/dL (ref 8.4–10.5)
Chloride: 106 mEq/L (ref 96–112)
Creatinine, Ser: 1.17 mg/dL (ref 0.40–1.20)
GFR: 46.99 mL/min — ABNORMAL LOW (ref >60.00)
Glucose, Bld: 96 mg/dL (ref 70–99)
Potassium: 4.1 mEq/L (ref 3.5–5.1)
Sodium: 135 mEq/L (ref 135–145)
Total Bilirubin: 0.4 mg/dL (ref 0.2–1.2)
Total Protein: 8 g/dL (ref 6.0–8.3)

## 2021-09-18 LAB — CBC WITH DIFFERENTIAL/PLATELET
Basophils Absolute: 0 10*3/uL (ref 0.0–0.1)
Basophils Relative: 0.7 % (ref 0.0–3.0)
Eosinophils Absolute: 0.3 10*3/uL (ref 0.0–0.7)
Eosinophils Relative: 6 % — ABNORMAL HIGH (ref 0.0–5.0)
HCT: 27.1 % — ABNORMAL LOW (ref 36.0–46.0)
Hemoglobin: 9 g/dL — ABNORMAL LOW (ref 12.0–15.0)
Lymphocytes Relative: 22.5 % (ref 12.0–46.0)
Lymphs Abs: 1.2 10*3/uL (ref 0.7–4.0)
MCHC: 33.1 g/dL (ref 30.0–36.0)
MCV: 92.5 fl (ref 78.0–100.0)
Monocytes Absolute: 0.5 10*3/uL (ref 0.1–1.0)
Monocytes Relative: 10.1 % (ref 3.0–12.0)
Neutro Abs: 3.2 10*3/uL (ref 1.4–7.7)
Neutrophils Relative %: 60.7 % (ref 43.0–77.0)
Platelets: 92 10*3/uL — ABNORMAL LOW (ref 150.0–400.0)
RBC: 2.93 Mil/uL — ABNORMAL LOW (ref 3.87–5.11)
RDW: 16.6 % — ABNORMAL HIGH (ref 11.5–15.5)
WBC: 5.2 10*3/uL (ref 4.0–10.5)

## 2021-09-18 LAB — MAGNESIUM: Magnesium: 2.1 mg/dL (ref 1.5–2.5)

## 2021-09-18 LAB — TRIGLYCERIDES: Triglycerides: 87 mg/dL (ref 0.0–149.0)

## 2021-09-18 LAB — PHOSPHORUS: Phosphorus: 2.7 mg/dL (ref 2.3–4.6)

## 2021-09-19 ENCOUNTER — Other Ambulatory Visit: Payer: Self-pay

## 2021-09-19 ENCOUNTER — Other Ambulatory Visit (HOSPITAL_COMMUNITY): Payer: Self-pay | Admitting: Internal Medicine

## 2021-09-19 DIAGNOSIS — Z789 Other specified health status: Secondary | ICD-10-CM

## 2021-09-19 LAB — PREALBUMIN: Prealbumin: 16 mg/dL — ABNORMAL LOW (ref 17–34)

## 2021-09-20 ENCOUNTER — Encounter: Payer: Self-pay | Admitting: Internal Medicine

## 2021-09-20 ENCOUNTER — Ambulatory Visit (AMBULATORY_SURGERY_CENTER): Payer: Medicare Other | Admitting: Internal Medicine

## 2021-09-20 VITALS — BP 110/33 | HR 55 | Temp 97.8°F | Resp 14 | Wt 150.0 lb

## 2021-09-20 DIAGNOSIS — Z86010 Personal history of colonic polyps: Secondary | ICD-10-CM

## 2021-09-20 DIAGNOSIS — D123 Benign neoplasm of transverse colon: Secondary | ICD-10-CM | POA: Diagnosis not present

## 2021-09-20 DIAGNOSIS — K635 Polyp of colon: Secondary | ICD-10-CM | POA: Diagnosis not present

## 2021-09-20 DIAGNOSIS — D122 Benign neoplasm of ascending colon: Secondary | ICD-10-CM

## 2021-09-20 DIAGNOSIS — K2990 Gastroduodenitis, unspecified, without bleeding: Secondary | ICD-10-CM

## 2021-09-20 DIAGNOSIS — D649 Anemia, unspecified: Secondary | ICD-10-CM | POA: Diagnosis not present

## 2021-09-20 DIAGNOSIS — K2970 Gastritis, unspecified, without bleeding: Secondary | ICD-10-CM

## 2021-09-20 DIAGNOSIS — K649 Unspecified hemorrhoids: Secondary | ICD-10-CM | POA: Diagnosis not present

## 2021-09-20 DIAGNOSIS — K2950 Unspecified chronic gastritis without bleeding: Secondary | ICD-10-CM | POA: Diagnosis not present

## 2021-09-20 DIAGNOSIS — I509 Heart failure, unspecified: Secondary | ICD-10-CM | POA: Diagnosis not present

## 2021-09-20 MED ORDER — SODIUM CHLORIDE 0.9 % IV SOLN: 500.0000 mL | Freq: Once | INTRAVENOUS | Status: DC

## 2021-09-20 NOTE — Progress Notes (Signed)
A/ox3, pleased with MAC, report to RN 

## 2021-09-20 NOTE — Progress Notes (Signed)
[facility] History and Physical ? ? ?Primary Care Physician:  [name], MD ? ? ?Reason for Procedure:   Anemia, Hx polyps ? ?Plan:    EGD, colonoscopy ? ? ? ? ?HPI: [name] [name] is a 71 y.o. female here for evaluation of iron deficiency anemia and hx polyps in setting of short bowel syndrome ? ? ?Past Medical History:  ?Diagnosis Date  ? Abnormal LFTs 2013  ? Allergic rhinosinusitis   ? Allergy   ? Anemia of chronic disease   ? At risk for dental problems   ? Atypical nevus   ? Bacteremia 10/27/2020  ? Bacteremia due to Klebsiella pneumoniae 12/12/2011  ? Bacterial overgrowth syndrome   ? 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 Carotid stenosis   ? Carotid US (9/15):  R 40-59%; L 1-39% >> FU 1 year  ? Cataract   ? Closed left subtrochanteric femur fracture ([place]) 12/31/2017  ? Clotting disorder ([place])   ? Congestive heart failure (CHF) ([place])   ? EF 25-30%  ? COVID-19 2022  ? 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 Fever 10/29/2020  ? Fracture of left clavicle   ? GERD (gastroesophageal reflux disease)   ? History of blood transfusion 2013  ? anemia  ? Hx of cardiovascular stress test   ? Myoview (9/15):  inf-apical scar; no ischemia; EF 47% - low risk   ? 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 12/12/2011  ? Osteoporosis   ? Pancytopenia 10/07/2011  ? Pathologic fracture of neck of femur ([place])   ? Personal history of colonic polyps   ? Renal insufficiency   ? hx of yrs ago  ? Serratia marcescens infection - bactermia assoc w/ PICC 01/18/2015  ? 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 Splenomegaly   ? By ultrasound  ? Stroke [facility]) 07/2020  ? Thrombophilia ([place])   ? Vitamin D deficiency   ? ? ?Past Surgical History:  ?Procedure Laterality Date  ? APPENDECTOMY  yrs ago  ? CHOLECYSTECTOMY  yrs ago  ? COLONOSCOPY  12/05/2005  ? internal hemorrhoids (for polyp surveillance)  ? COLONOSCOPY   04/26/2012  ? Procedure: COLONOSCOPY;  Surgeon: Gatha Mayer, MD;  Location: [facility];  Service: Endoscopy;  Laterality: N/A;  ? COLONOSCOPY WITH PROPOFOL N/A 03/18/2016  ? Procedure: COLONOSCOPY WITH PROPOFOL;  Surgeon: Gatha Mayer, MD;  Location: [facility];  Service: Endoscopy;  Laterality: N/A;  ? ESOPHAGOGASTRODUODENOSCOPY  01/22/2009  ? erosive esophagitis  ? HARDWARE REMOVAL Left 12/31/2017  ? Procedure: HARDWARE REMOVAL;  Surgeon: Rod Can, MD;  Location: [facility];  Service: Orthopedics;  Laterality: Left;  ? INTRAMEDULLARY (IM) NAIL INTERTROCHANTERIC Left 12/31/2017  ? Procedure: INTRAMEDULLARY (IM) NAIL SUBTROCHANTRIC;  Surgeon: Rod Can, MD;  Location: [facility];  Service: Orthopedics;  Laterality: Left;  ? IR CV LINE INJECTION  03/23/2018  ? IR FLUORO GUIDE CV LINE LEFT  01/01/2018  ? IR FLUORO GUIDE CV LINE LEFT  03/24/2018  ? IR FLUORO GUIDE CV LINE LEFT  05/21/2018  ? IR FLUORO GUIDE CV LINE LEFT  07/09/2018  ? IR FLUORO GUIDE CV LINE LEFT  12/28/2020  ? IR FLUORO GUIDE CV LINE LEFT  07/02/2021  ? IR FLUORO GUIDE CV LINE RIGHT  11/01/2020  ? IR GENERIC HISTORICAL  07/04/2016  ? IR REMOVAL TUN CV CATH W/O [facility] 07/04/2016 Ascencion Dike, PA-C  [facility]  ? IR GENERIC HISTORICAL  07/10/2016  ? IR US GUIDE VASC ACCESS LEFT 07/10/2016 Arne Cleveland, MD [facility]  ? IR GENERIC HISTORICAL  07/10/2016  ? IR FLUORO GUIDE CV LINE LEFT 07/10/2016 Arne Cleveland, MD [facility]  ? IR PATIENT EVAL TECH 0-60 MINS  10/07/2017  ? IR PATIENT EVAL TECH 0-60 MINS  03/09/2018  ? IR RADIOLOGIST EVAL & MGMT  07/21/2017  ? IR RADIOLOGIST EVAL & MGMT  11/14/2020  ? IR RADIOLOGIST EVAL & MGMT  11/27/2020  ? IR RADIOLOGIST EVAL & MGMT  01/24/2021  ? IR REMOVAL TUN CV CATH W/O FL  12/28/2020  ? IR TRANSCATH PLC STENT  INITIAL VEIN  INC ANGIOPLASTY  12/28/2020  ? IR US GUIDE VASC ACCESS LEFT  11/01/2020  ? IR US GUIDE VASC ACCESS LEFT  12/28/2020  ? IR US GUIDE VASC ACCESS LEFT  12/28/2020  ? IR US GUIDE VASC ACCESS LEFT  12/28/2020   ? IR US GUIDE VASC ACCESS RIGHT  11/01/2020  ? IR VENO/EXT/UNI LEFT  11/01/2020  ? IR VENOCAVAGRAM SVC  12/28/2020  ? LEFT HEART CATH AND CORONARY ANGIOGRAPHY N/A 06/26/2020  ? Procedure: LEFT HEART CATH AND CORONARY ANGIOGRAPHY;  Surgeon: Jolaine Artist, MD;  Location: [facility];  Service: Cardiovascular;  Laterality: N/A;  ? ORIF PROXIMAL FEMORAL FRACTURE W/ ITST NAIL SYSTEM  03/2007  ? left, Dr. Shellia Carwin  ? SMALL INTESTINE SURGERY  2005  ? multiple with right colon resection for ischemia/infarct  ? ? ?Prior to Admission medications   ?Medication Sig Start Date End Date Taking? Authorizing Provider  ?acetaminophen (TYLENOL) 500 MG tablet Take 1,000 mg by mouth daily as needed for mild pain.   Yes [provider]  ?ADULT TPN Inject 1,800 mLs into the vein 4 (four) times a week. Pt receives home TPN from [facility]:  1800 mL bag, four nights weekly (Monday, Tuesday, Wednesday, Thursday for 8 hours (includes 1 hour taper up and down).   Yes [provider]  ?Calcium Carb-Cholecalciferol (CALCIUM + D3 PO) Take 2 tablets by mouth daily.   Yes [provider]  ?empagliflozin (JARDIANCE) 10 MG TABS tablet Take 1 tablet (10 mg total) by mouth daily before breakfast. 06/11/21  Yes Bensimhon, Shaune Pascal, MD  ?ergocalciferol (DRISDOL) 200 MCG/ML drops Take 6 mLs (48,000 Units total) by mouth 3 (three) times a week. 08/21/21  Yes Gatha Mayer, MD  ?Heparin Sodium, Porcine, (HEPARIN LOCK FLUSH IJ) Inject 5 mLs as directed 4 (four) times a week. Monday, Tuesday, Wednesday, Thursday in the morning with TPN   Yes [provider]  ?losartan (COZAAR) 25 MG tablet Take 0.5 tablets (12.5 mg total) by mouth at bedtime. 12/18/20 06/11/22 Yes Bensimhon, Shaune Pascal, MD  ?Multiple Vitamins-Minerals (ADULT GUMMY PO) Take 2 tablets by mouth daily.   Yes [provider]  ?omeprazole (PRILOSEC) 40 MG capsule TAKE 1 CAPSULE BY MOUTH EVERY DAY BEFORE BREAKFAST 06/28/21  Yes Gatha Mayer, MD   ?ondansetron (ZOFRAN-ODT) 8 MG disintegrating tablet TAKE 1 TABLET BY MOUTH EVERY 8 HOURS AS NEEDED FOR NAUSEA OR VOMITING. 09/09/21  Yes Gatha Mayer, MD  ?sodium chloride 0.9 % infusion Inject 900 mLs into the vein every 14 (fourteen) days. Uses for TPN 10/27/17  Yes [provider]  ?spironolactone (ALDACTONE) 25 MG tablet TAKE 1 TABLET (25 MG TOTAL) BY MOUTH DAILY. 08/28/21  Yes Bensimhon, Shaune Pascal, MD  ?Teduglutide, rDNA, 5 MG KIT Inject 3.1 application  into the skin every morning.   Yes [provider]  ?vitamin E 1000 UNIT capsule Take 1,000 Units by mouth daily.   Yes [provider]  ?baclofen (LIORESAL) 10 MG tablet Take 1 tablet (10 mg total) by mouth 2 (two) times daily as needed for muscle spasms. 08/29/20   Frann Rider, NP  ?cetirizine (ZYRTEC) 10 MG tablet Take 1 tablet (10 mg total) by mouth at bedtime as needed for allergies. 12/19/20   Kennyth Arnold, FNP  ?clobetasol ointment (TEMOVATE) 1.44 % Apply 1 application topically 2 (two) times daily. ?Patient not taking: Reported on 09/20/2021 06/14/21   [provider]  ?diphenhydrAMINE (BENADRYL) 25 mg capsule Take 25-50 mg by mouth every 6 (six) hours as needed for itching or sleep.     [provider]  ?fluticasone (FLONASE) 50 MCG/ACT nasal spray Place 2 sprays into both nostrils daily as needed for allergies or rhinitis.    [provider]  ?furosemide (LASIX) 20 MG tablet TAKE ONE TABLET BY MOUTH AS NEEDED FOR SWELLING 02/11/21   Bensimhon, Shaune Pascal, MD  ?meclizine (ANTIVERT) 25 MG tablet Take 1 tablet (25 mg total) by mouth 3 (three) times daily as needed for dizziness. ?Patient not taking: Reported on 09/20/2021 04/29/21   Haydee Salter, MD  ?potassium chloride SA (KLOR-CON M) 20 MEQ tablet Take 1 tablet (20 mEq total) by mouth 2 (two) times daily for 4 days. 08/15/21 08/19/21  Curatolo, Adam, DO  ?triamcinolone cream (KENALOG) 0.1 % Apply 1 application topically as needed (itching).    [provider]  ?XARELTO 20 MG TABS tablet TAKE 1 TABLET BY MOUTH DAILY WITH SUPPER. 08/30/21   [name], MD  ? ? ?Current Outpatient Medications  ?Medication Sig Dispense Refill  ? acetaminophen

## 2021-09-20 NOTE — Op Note (Addendum)
[facility] ?Patient Name: [name] [name] ?Procedure Date: 09/20/2021 8:36 AM ?MRN: [id number] ?Endoscopist: Gatha Mayer , MD ?Age: 71 ?Referring MD:  ?Date of Birth: 07-Nov-1950 ?Gender: Female ?Account #: [id number] ?Procedure:                Colonoscopy ?Indications:              Surveillance: Personal history of adenomatous  ?                          polyps on last colonoscopy > 5 years ago, Last  ?                          colonoscopy: 2017 ?Medicines:                Monitored Anesthesia Care ?Procedure:                Pre-Anesthesia Assessment: ?                          - Prior to the procedure, a History and Physical  ?                          was performed, and patient medications and  ?                          allergies were reviewed. The patient's tolerance of  ?                          previous anesthesia was also reviewed. The risks  ?                          and benefits of the procedure and the sedation  ?                          options and risks were discussed with the patient.  ?                          All questions were answered, and informed consent  ?                          was obtained. Prior Anticoagulants: The patient  ?                          last took Xarelto (rivaroxaban) 2 days prior to the  ?                          procedure. ASA Grade Assessment: III - A patient  ?                          with severe systemic disease. After reviewing the  ?                          risks and benefits, the patient was deemed in  ?  satisfactory condition to undergo the procedure. ?                          After obtaining informed consent, the colonoscope  ?                          was passed under direct vision. Throughout the  ?                          procedure, the patient's blood pressure, pulse, and  ?                          oxygen saturations were monitored continuously. The  ?                          Olympus CF-[id number] (#[id number]) Colonoscope was   ?                          introduced through the anus and advanced to the the  ?                          ileocolonic anastomosis. The colonoscopy was  ?                          performed with difficulty due to significant  ?                          looping. Successful completion of the procedure was  ?                          aided by straightening and shortening the scope to  ?                          obtain bowel loop reduction and applying abdominal  ?                          pressure. The patient tolerated the procedure well.  ?                          The quality of the bowel preparation was adequate.  ?                          The bowel preparation used was Miralax via split  ?                          dose instruction. The rectum and Ileocolonic  ?                          anastomsis areas were photographed. ?Scope In: 8:57:41 AM ?Scope Out: 9:35:41 AM ?Scope Withdrawal Time: 0 hours 18 minutes 21 seconds  ?Total Procedure Duration: 0 hours 38 minutes 0 seconds  ?Findings:                 The perianal and digital rectal examinations were  ?  normal. ?                          Two sessile polyps were found in the transverse  ?                          colon and ascending colon. The polyps were 5 to 12  ?                          mm in size. These polyps were removed with a cold  ?                          snare. Resection and retrieval were complete.  ?                          Verification of patient identification for the  ?                          specimen was done. Estimated blood loss was  ?                          minimal. To prevent bleeding after the polypectomy,  ?                          three hemostatic clips were successfully placed (MR  ?                          conditional). There was no bleeding at the end of  ?                          the procedure. Estimated blood loss: none. ?                          Scattered diverticula were found in the entire  ?                           colon. ?                          There was evidence of a prior jejuno-colonic  ?                          anastomosis in the ascending colon. This was patent  ?                          and was characterized by healthy appearing mucosa. ?                          The exam was otherwise without abnormality on  ?                          direct and retroflexion views. ?Complications:            No immediate complications. ?Estimated Blood Loss:     Estimated blood loss was  minimal. ?Impression:               - Two 5 to 12 mm polyps in the transverse colon and  ?                          in the ascending colon, removed with a cold snare.  ?                          Resected and retrieved. Clips (MR conditional) were  ?                          placed. ?                          - Diverticulosis in the entire examined colon. ?                          - Patent jejuno-colonic anastomosis, characterized  ?                          by healthy appearing mucosa. ?                          - The examination was otherwise normal on direct  ?                          and retroflexion views. Difficult insertion - RLQ  ?                          pressure helped most - consider abdominal binder  ?                          use in future ?                          - Personal history of colonic polyps. ?                          2005 - 2 serrated adenomas ?                          2007 - no polyps ?                          04/2012 - 6 mm sessile serrated polyp sigmoid ?                          2015 - after Gattex no polyps ?                          03/18/2016 no polyps ?Recommendation:           - Patient has a contact number available for  ?                          emergencies. The signs and symptoms of potential  ?  delayed complications were discussed with the  ?                          patient. Return to normal activities tomorrow.  ?                          Written discharge  instructions were provided to the  ?                          patient. ?                          - Resume previous diet. ?                          - Continue present medications. ?                          - Resume Xarelto (rivaroxaban) at prior dose  ?                          tomorrow. ?                          - Await pathology results. ?                          - Repeat colonoscopy is recommended for  ?                          surveillance. The colonoscopy date will be  ?                          determined after pathology results from today's  ?                          exam become available for review. ?Gatha Mayer, MD ?09/20/2021 9:49:19 AM ?This report has been signed electronically. ?

## 2021-09-20 NOTE — Progress Notes (Signed)
Called to room to assist during endoscopic procedure.  Patient ID and intended procedure confirmed with present staff. Received instructions for my participation in the procedure from the performing physician.  

## 2021-09-20 NOTE — Op Note (Signed)
[facility] ?Patient Name: [name] [name] ?Procedure Date: 09/20/2021 8:37 AM ?MRN: [id number] ?Endoscopist: Gatha Mayer , MD ?Age: 71 ?Referring MD:  ?Date of Birth: Feb 20, 1951 ?Gender: Female ?Account #: [id number] ?Procedure:                Upper GI endoscopy ?Indications:              Anemia ?Medicines:                Monitored Anesthesia Care ?Procedure:                Pre-Anesthesia Assessment: ?                          - Prior to the procedure, a History and Physical  ?                          was performed, and patient medications and  ?                          allergies were reviewed. The patient's tolerance of  ?                          previous anesthesia was also reviewed. The risks  ?                          and benefits of the procedure and the sedation  ?                          options and risks were discussed with the patient.  ?                          All questions were answered, and informed consent  ?                          was obtained. Prior Anticoagulants: The patient  ?                          last took Xarelto (rivaroxaban) 2 days prior to the  ?                          procedure. ASA Grade Assessment: III - A patient  ?                          with severe systemic disease. After reviewing the  ?                          risks and benefits, the patient was deemed in  ?                          satisfactory condition to undergo the procedure. ?                          After obtaining informed consent, the endoscope was  ?  passed under direct vision. Throughout the  ?                          procedure, the patient's blood pressure, pulse, and  ?                          oxygen saturations were monitored continuously. The  ?                          GIF HQ190 #[id number] was introduced through the  ?                          mouth, and advanced to the second part of duodenum.  ?                          The upper GI endoscopy was accomplished  without  ?                          difficulty. The patient tolerated the procedure  ?                          well. ?Scope In: ?Scope Out: ?Findings:                 Diffuse mild inflammation characterized by  ?                          erythema, friability and granularity was found in  ?                          the gastric body and in the gastric antrum.  ?                          Biopsies were taken with a cold forceps for  ?                          histology. Verification of patient identification  ?                          for the specimen was done. Estimated blood loss was  ?                          minimal. ?                          The exam was otherwise without abnormality. ?                          The cardia and gastric fundus were normal on  ?                          retroflexion. ?Complications:            No immediate complications. ?Estimated Blood Loss:     Estimated blood loss was minimal. ?Impression:               -  Gastritis. Biopsied. ?                          - The examination was otherwise normal. ?Recommendation:           - Patient has a contact number available for  ?                          emergencies. The signs and symptoms of potential  ?                          delayed complications were discussed with the  ?                          patient. Return to normal activities tomorrow.  ?                          Written discharge instructions were provided to the  ?                          patient. ?                          - See the other procedure note for documentation of  ?                          additional recommendations. Colonoscopy next ?Gatha Mayer, MD ?09/20/2021 9:43:26 AM ?This report has been signed electronically. ?

## 2021-09-20 NOTE — Progress Notes (Signed)
VS-CW 

## 2021-09-20 NOTE — Patient Instructions (Addendum)
The stomach looks inflamed - gastritis. Biopsies taken. ? ?Two colon polyps removed, some diverticulosis. ? ?I will let you know pathology results and plans. ? ?Restart Xarelto tomorrow. ? ?I appreciate the opportunity to care for you. ?Gatha Mayer, MD, [facility] ? ? ?YOU HAD AN ENDOSCOPIC PROCEDURE TODAY AT [facility]:   Refer to the procedure report that was given to you for any specific questions about what was found during the examination.  If the procedure report does not answer your questions, please call your gastroenterologist to clarify.  If you requested that your care partner not be given the details of your procedure findings, then the procedure report has been included in a sealed envelope for you to review at your convenience later. ? ?YOU SHOULD EXPECT: Some feelings of bloating in the abdomen. Passage of more gas than usual.  Walking can help get rid of the air that was put into your GI tract during the procedure and reduce the bloating. If you had a lower endoscopy (such as a colonoscopy or flexible sigmoidoscopy) you may notice spotting of blood in your stool or on the toilet paper. If you underwent a bowel prep for your procedure, you may not have a normal bowel movement for a few days. ? ?Please Note:  You might notice some irritation and congestion in your nose or some drainage.  This is from the oxygen used during your procedure.  There is no need for concern and it should clear up in a day or so. ? ?SYMPTOMS TO REPORT IMMEDIATELY: ? ?Following lower endoscopy (colonoscopy or flexible sigmoidoscopy): ? Excessive amounts of blood in the stool ? Significant tenderness or worsening of abdominal pains ? Swelling of the abdomen that is new, acute ? Fever of 100?F or higher ? ?Following upper endoscopy (EGD) ? Vomiting of blood or coffee ground material ? New chest pain or pain under the shoulder blades ? Painful or persistently difficult swallowing ? New shortness of breath ? Fever  of 100?F or higher ? Black, tarry-looking stools ? ?For urgent or emergent issues, a gastroenterologist can be reached at any hour by calling [phone]. ?Do not use MyChart messaging for urgent concerns.  ? ? ?DIET:  We do recommend a small meal at first, but then you may proceed to your regular diet.  Drink plenty of fluids but you should avoid alcoholic beverages for 24 hours. ? ?ACTIVITY:  You should plan to take it easy for the rest of today and you should NOT DRIVE or use heavy machinery until tomorrow (because of the sedation medicines used during the test).   ? ?FOLLOW UP: ?Our staff will call the number listed on your records 48-72 hours following your procedure to check on you and address any questions or concerns that you may have regarding the information given to you following your procedure. If we do not reach you, we will leave a message.  We will attempt to reach you two times.  During this call, we will ask if you have developed any symptoms of COVID 19. If you develop any symptoms (ie: fever, flu-like symptoms, shortness of breath, cough etc.) before then, please call [phone].  If you test positive for Covid 19 in the 2 weeks post procedure, please call and report this information to [place].   ? ?If any biopsies were taken you will be contacted by phone or by letter within the next 1-3 weeks.  Please call us at [phone] if you  have not heard about the biopsies in 3 weeks.  ? ? ?SIGNATURES/CONFIDENTIALITY: ?You and/or your care partner have signed paperwork which will be entered into your electronic medical record.  These signatures attest to the fact that that the information above on your After Visit Summary has been reviewed and is understood.  Full responsibility of the confidentiality of this discharge information lies with you and/or your care-partner.  ?

## 2021-09-23 ENCOUNTER — Ambulatory Visit: Payer: Medicare Other

## 2021-09-23 ENCOUNTER — Inpatient Hospital Stay: Payer: Medicare Other

## 2021-09-23 ENCOUNTER — Other Ambulatory Visit: Payer: Self-pay

## 2021-09-23 VITALS — BP 116/53 | HR 51 | Temp 97.9°F | Resp 18

## 2021-09-23 DIAGNOSIS — N1831 Chronic kidney disease, stage 3a: Secondary | ICD-10-CM | POA: Diagnosis not present

## 2021-09-23 DIAGNOSIS — D638 Anemia in other chronic diseases classified elsewhere: Secondary | ICD-10-CM

## 2021-09-23 DIAGNOSIS — D631 Anemia in chronic kidney disease: Secondary | ICD-10-CM | POA: Diagnosis not present

## 2021-09-23 DIAGNOSIS — D696 Thrombocytopenia, unspecified: Secondary | ICD-10-CM | POA: Diagnosis not present

## 2021-09-23 MED ORDER — SODIUM CHLORIDE 0.9 % IV SOLN
400.0000 mg | Freq: Once | INTRAVENOUS | Status: AC
  Administered 2021-09-23: 400 mg via INTRAVENOUS
  Filled 2021-09-23: qty 20

## 2021-09-23 MED ORDER — SODIUM CHLORIDE 0.9 % IV SOLN: Freq: Once | INTRAVENOUS | Status: AC

## 2021-09-23 NOTE — Progress Notes (Signed)
Pt declined to stay 30 minute post infusion observation. VSS upon discharge.  ?

## 2021-09-23 NOTE — Patient Instructions (Signed)

## 2021-09-24 ENCOUNTER — Telehealth: Payer: Self-pay

## 2021-09-24 NOTE — Telephone Encounter (Signed)
?  Follow up Call- ? ? ?  09/20/2021  ?  7:28 AM  ?Call back number  ?Post procedure Call Back phone  # [phone]  ?Permission to leave phone message Yes  ?  ? ?Patient questions: ? ?Do you have a fever, pain , or abdominal swelling? No. ?Pain Score  0 * ? ?Have you tolerated food without any problems? Yes.   ? ?Have you been able to return to your normal activities? Yes.   ? ?Do you have any questions about your discharge instructions: ?Diet   No. ?Medications  No. ?Follow up visit  No. ? ?Do you have questions or concerns about your Care? No. ? ?Actions: ?* If pain score is 4 or above: ?No action needed, pain <4. ? ? ?

## 2021-09-25 ENCOUNTER — Other Ambulatory Visit: Payer: Medicare Other

## 2021-09-29 ENCOUNTER — Encounter: Payer: Self-pay | Admitting: Internal Medicine

## 2021-09-29 DIAGNOSIS — Z86010 Personal history of colonic polyps: Secondary | ICD-10-CM

## 2021-09-30 ENCOUNTER — Ambulatory Visit: Payer: Medicare Other

## 2021-09-30 ENCOUNTER — Encounter (HOSPITAL_COMMUNITY): Payer: Medicare Other

## 2021-10-02 ENCOUNTER — Ambulatory Visit (HOSPITAL_COMMUNITY)
Admission: RE | Admit: 2021-10-02 | Discharge: 2021-10-02 | Disposition: A | Discharge status: Home / Self Care | Payer: Medicare Other | Source: Ambulatory Visit | Attending: Internal Medicine | Admitting: Internal Medicine

## 2021-10-02 ENCOUNTER — Other Ambulatory Visit: Payer: Self-pay | Admitting: Internal Medicine

## 2021-10-02 DIAGNOSIS — Z789 Other specified health status: Secondary | ICD-10-CM

## 2021-10-02 DIAGNOSIS — R7881 Bacteremia: Secondary | ICD-10-CM | POA: Insufficient documentation

## 2021-10-02 DIAGNOSIS — Z452 Encounter for adjustment and management of vascular access device: Secondary | ICD-10-CM | POA: Insufficient documentation

## 2021-10-02 DIAGNOSIS — T82590A Other mechanical complication of surgically created arteriovenous fistula, initial encounter: Secondary | ICD-10-CM | POA: Diagnosis not present

## 2021-10-02 HISTORY — PX: IR CV LINE INJECTION: IMG2294

## 2021-10-02 MED ORDER — HEPARIN SOD (PORK) LOCK FLUSH 100 UNIT/ML IV SOLN
INTRAVENOUS | Status: DC | PRN
  Administered 2021-10-02: 500 [IU]

## 2021-10-02 MED ORDER — HEPARIN SOD (PORK) LOCK FLUSH 100 UNIT/ML IV SOLN
INTRAVENOUS | Status: AC
  Filled 2021-10-02: qty 5

## 2021-10-02 MED ORDER — IOHEXOL 300 MG/ML  SOLN
50.0000 mL | Freq: Once | INTRAMUSCULAR | Status: AC | PRN
  Administered 2021-10-02: 10 mL via INTRAVENOUS

## 2021-10-02 NOTE — Procedures (Signed)
Pre-procedure Diagnosis: Poor venous access Post-procedure Diagnosis: Same  Partial retraction of central venous catheter however the cuff remains contained within the subcutaneous track and contrast injection demonstrates appropriate positioning and functionality the catheter with tip terminating within the superior aspect of the right atrium and without evidence of a fibrin sheath.   No exchange performed.   PLAN:  Given patient's poor central venous access, do NOT remove the central venous catheter except under the explicit direction of the interventional radiology service.   Ronny Bacon, MD Pager #: [phone]

## 2021-10-03 ENCOUNTER — Inpatient Hospital Stay: Payer: Medicare Other

## 2021-10-03 ENCOUNTER — Other Ambulatory Visit: Payer: Self-pay

## 2021-10-03 VITALS — BP 109/55 | HR 54 | Temp 98.4°F | Resp 18

## 2021-10-03 DIAGNOSIS — D638 Anemia in other chronic diseases classified elsewhere: Secondary | ICD-10-CM

## 2021-10-03 DIAGNOSIS — N1831 Chronic kidney disease, stage 3a: Secondary | ICD-10-CM | POA: Diagnosis not present

## 2021-10-03 DIAGNOSIS — D508 Other iron deficiency anemias: Secondary | ICD-10-CM

## 2021-10-03 DIAGNOSIS — D631 Anemia in chronic kidney disease: Secondary | ICD-10-CM | POA: Diagnosis not present

## 2021-10-03 DIAGNOSIS — D696 Thrombocytopenia, unspecified: Secondary | ICD-10-CM | POA: Diagnosis not present

## 2021-10-03 LAB — CBC WITH DIFFERENTIAL (CANCER CENTER ONLY)
Abs Immature Granulocytes: 0.01 10*3/uL (ref 0.00–0.07)
Basophils Absolute: 0 10*3/uL (ref 0.0–0.1)
Basophils Relative: 1 %
Eosinophils Absolute: 0.5 10*3/uL (ref 0.0–0.5)
Eosinophils Relative: 11 %
HCT: 29.7 % — ABNORMAL LOW (ref 36.0–46.0)
Hemoglobin: 9.3 g/dL — ABNORMAL LOW (ref 12.0–15.0)
Immature Granulocytes: 0 %
Lymphocytes Relative: 31 %
Lymphs Abs: 1.5 10*3/uL (ref 0.7–4.0)
MCH: 29.4 pg (ref 26.0–34.0)
MCHC: 31.3 g/dL (ref 30.0–36.0)
MCV: 94 fL (ref 80.0–100.0)
Monocytes Absolute: 0.5 10*3/uL (ref 0.1–1.0)
Monocytes Relative: 9 %
Neutro Abs: 2.3 10*3/uL (ref 1.7–7.7)
Neutrophils Relative %: 48 %
Platelet Count: 66 10*3/uL — ABNORMAL LOW (ref 150–400)
RBC: 3.16 MIL/uL — ABNORMAL LOW (ref 3.87–5.11)
RDW: 17.1 % — ABNORMAL HIGH (ref 11.5–15.5)
WBC Count: 4.8 10*3/uL (ref 4.0–10.5)
nRBC: 0 % (ref 0.0–0.2)

## 2021-10-03 LAB — CMP (CANCER CENTER ONLY)
ALT: 23 U/L (ref 0–44)
AST: 26 U/L (ref 15–41)
Albumin: 3.1 g/dL — ABNORMAL LOW (ref 3.5–5.0)
Alkaline Phosphatase: 173 U/L — ABNORMAL HIGH (ref 38–126)
Anion gap: 4 — ABNORMAL LOW (ref 5–15)
BUN: 31 mg/dL — ABNORMAL HIGH (ref 8–23)
CO2: 24 mmol/L (ref 22–32)
Calcium: 8.3 mg/dL — ABNORMAL LOW (ref 8.9–10.3)
Chloride: 108 mmol/L (ref 98–111)
Creatinine: 1.06 mg/dL — ABNORMAL HIGH (ref 0.44–1.00)
GFR, Estimated: 56 mL/min — ABNORMAL LOW (ref >60)
Glucose, Bld: 99 mg/dL (ref 70–99)
Potassium: 4.3 mmol/L (ref 3.5–5.1)
Sodium: 136 mmol/L (ref 135–145)
Total Bilirubin: 0.5 mg/dL (ref 0.3–1.2)
Total Protein: 7.7 g/dL (ref 6.5–8.1)

## 2021-10-03 LAB — FERRITIN: Ferritin: 244 ng/mL (ref 11–307)

## 2021-10-03 MED ORDER — DARBEPOETIN ALFA 300 MCG/0.6ML IJ SOSY
300.0000 ug | PREFILLED_SYRINGE | Freq: Once | INTRAMUSCULAR | Status: AC
  Administered 2021-10-03: 300 ug via SUBCUTANEOUS
  Filled 2021-10-03: qty 0.6

## 2021-10-06 ENCOUNTER — Ambulatory Visit
Admission: RE | Admit: 2021-10-06 | Discharge: 2021-10-06 | Disposition: A | Discharge status: Home / Self Care | Payer: Medicare Other | Source: Ambulatory Visit | Attending: Urgent Care | Admitting: Urgent Care

## 2021-10-06 VITALS — BP 115/71 | HR 56 | Temp 97.7°F | Resp 20

## 2021-10-06 DIAGNOSIS — N3001 Acute cystitis with hematuria: Secondary | ICD-10-CM | POA: Insufficient documentation

## 2021-10-06 DIAGNOSIS — R300 Dysuria: Secondary | ICD-10-CM | POA: Insufficient documentation

## 2021-10-06 DIAGNOSIS — R350 Frequency of micturition: Secondary | ICD-10-CM | POA: Insufficient documentation

## 2021-10-06 LAB — POCT URINALYSIS DIP (MANUAL ENTRY)
Bilirubin, UA: NEGATIVE
Glucose, UA: NEGATIVE mg/dL
Ketones, POC UA: NEGATIVE mg/dL
Nitrite, UA: NEGATIVE
Protein Ur, POC: 30 mg/dL — AB
Spec Grav, UA: 1.01 (ref 1.010–1.025)
Urobilinogen, UA: 0.2 E.U./dL
pH, UA: 5.5 (ref 5.0–8.0)

## 2021-10-06 MED ORDER — CEPHALEXIN 500 MG PO CAPS: 500.0000 mg | ORAL_CAPSULE | Freq: Two times a day (BID) | ORAL | 0 refills | Status: DC

## 2021-10-08 ENCOUNTER — Encounter: Payer: Self-pay | Admitting: Hematology and Oncology

## 2021-10-09 ENCOUNTER — Other Ambulatory Visit (INDEPENDENT_AMBULATORY_CARE_PROVIDER_SITE_OTHER): Payer: Medicare Other

## 2021-10-09 DIAGNOSIS — I6523 Occlusion and stenosis of bilateral carotid arteries: Secondary | ICD-10-CM

## 2021-10-09 DIAGNOSIS — K760 Fatty (change of) liver, not elsewhere classified: Secondary | ICD-10-CM | POA: Diagnosis not present

## 2021-10-09 DIAGNOSIS — K912 Postsurgical malabsorption, not elsewhere classified: Secondary | ICD-10-CM

## 2021-10-09 DIAGNOSIS — D649 Anemia, unspecified: Secondary | ICD-10-CM

## 2021-10-09 DIAGNOSIS — K90829 Short bowel syndrome, unspecified: Secondary | ICD-10-CM

## 2021-10-09 LAB — COMPREHENSIVE METABOLIC PANEL
ALT: 21 U/L (ref 0–35)
AST: 23 U/L (ref 0–37)
Albumin: 3.2 g/dL — ABNORMAL LOW (ref 3.5–5.2)
Alkaline Phosphatase: 169 U/L — ABNORMAL HIGH (ref 39–117)
BUN: 24 mg/dL — ABNORMAL HIGH (ref 6–23)
CO2: 25 mEq/L (ref 19–32)
Calcium: 8.6 mg/dL (ref 8.4–10.5)
Chloride: 105 mEq/L (ref 96–112)
Creatinine, Ser: 1.15 mg/dL (ref 0.40–1.20)
GFR: 47.95 mL/min — ABNORMAL LOW (ref >60.00)
Glucose, Bld: 114 mg/dL — ABNORMAL HIGH (ref 70–99)
Potassium: 3.9 mEq/L (ref 3.5–5.1)
Sodium: 136 mEq/L (ref 135–145)
Total Bilirubin: 0.4 mg/dL (ref 0.2–1.2)
Total Protein: 7.8 g/dL (ref 6.0–8.3)

## 2021-10-09 LAB — CBC WITH DIFFERENTIAL/PLATELET
Basophils Absolute: 0 10*3/uL (ref 0.0–0.1)
Basophils Relative: 0.7 % (ref 0.0–3.0)
Eosinophils Absolute: 0.3 10*3/uL (ref 0.0–0.7)
Eosinophils Relative: 7.8 % — ABNORMAL HIGH (ref 0.0–5.0)
HCT: 30.7 % — ABNORMAL LOW (ref 36.0–46.0)
Hemoglobin: 9.8 g/dL — ABNORMAL LOW (ref 12.0–15.0)
Lymphocytes Relative: 23.7 % (ref 12.0–46.0)
Lymphs Abs: 1 10*3/uL (ref 0.7–4.0)
MCHC: 32.1 g/dL (ref 30.0–36.0)
MCV: 94.3 fl (ref 78.0–100.0)
Monocytes Absolute: 0.5 10*3/uL (ref 0.1–1.0)
Monocytes Relative: 10.4 % (ref 3.0–12.0)
Neutro Abs: 2.5 10*3/uL (ref 1.4–7.7)
Neutrophils Relative %: 57.4 % (ref 43.0–77.0)
Platelets: 84 10*3/uL — ABNORMAL LOW (ref 150.0–400.0)
RBC: 3.25 Mil/uL — ABNORMAL LOW (ref 3.87–5.11)
RDW: 16.9 % — ABNORMAL HIGH (ref 11.5–15.5)
WBC: 4.4 10*3/uL (ref 4.0–10.5)

## 2021-10-09 LAB — TRIGLYCERIDES: Triglycerides: 78 mg/dL (ref 0.0–149.0)

## 2021-10-09 LAB — MAGNESIUM: Magnesium: 2.1 mg/dL (ref 1.5–2.5)

## 2021-10-09 LAB — PHOSPHORUS: Phosphorus: 2.7 mg/dL (ref 2.3–4.6)

## 2021-10-10 ENCOUNTER — Ambulatory Visit (INDEPENDENT_AMBULATORY_CARE_PROVIDER_SITE_OTHER): Payer: Medicare Other | Admitting: Family Medicine

## 2021-10-10 ENCOUNTER — Other Ambulatory Visit: Payer: Self-pay

## 2021-10-10 ENCOUNTER — Encounter: Payer: Self-pay | Admitting: Family Medicine

## 2021-10-10 VITALS — BP 114/74 | HR 53 | Temp 97.6°F | Resp 18 | Ht 68.0 in | Wt 147.4 lb

## 2021-10-10 DIAGNOSIS — Z1620 Resistance to unspecified antibiotic: Secondary | ICD-10-CM

## 2021-10-10 DIAGNOSIS — R350 Frequency of micturition: Secondary | ICD-10-CM | POA: Diagnosis not present

## 2021-10-10 DIAGNOSIS — I6523 Occlusion and stenosis of bilateral carotid arteries: Secondary | ICD-10-CM | POA: Diagnosis not present

## 2021-10-10 LAB — POCT URINALYSIS DIP (MANUAL ENTRY)
Bilirubin, UA: NEGATIVE
Glucose, UA: NEGATIVE mg/dL
Ketones, POC UA: NEGATIVE mg/dL
Leukocytes, UA: NEGATIVE
Nitrite, UA: POSITIVE — AB
Spec Grav, UA: 1.01 (ref 1.010–1.025)
Urobilinogen, UA: 0.2 E.U./dL
pH, UA: 5.5 (ref 5.0–8.0)

## 2021-10-10 LAB — URINE CULTURE: Culture: >=100000 — AB

## 2021-10-10 LAB — PREALBUMIN: Prealbumin: 13 mg/dL — ABNORMAL LOW (ref 17–34)

## 2021-10-10 MED ORDER — CIPROFLOXACIN HCL 500 MG PO TABS: 500.0000 mg | ORAL_TABLET | Freq: Two times a day (BID) | ORAL | 0 refills | Status: AC

## 2021-10-10 NOTE — Patient Instructions (Addendum)
Follow up as needed or as scheduled STOP the Cephalexin START the Cipro twice daily x5 days Continue to drink fluids to flush the kidneys and bladder Call with any questions or concerns Stay Safe!  Stay Healthy! Have a great summer!!!

## 2021-10-10 NOTE — Progress Notes (Signed)
   Subjective:    Patient ID: [name] [name], female    DOB: 1951/01/29, 71 y.o.   MRN: [id number]  HPI UTI- pt went to UC on 5/28 and dx'd w/ UTI.  Was started on Keflex and is on last day of abx.  Urine culture resulted today and is resistant to abx tx.  Her initial sxs were urinary frequency, incontinence.  No fevers.  Currently having some burning w/ urination, continues to have frequency and incontinence.  Taking AZO.  Culture results indicate susceptibility to Cipro and Bactrim.  Pt does well on Cipro but has diarrhea w/ Bactrim   Review of Systems For ROS see HPI     Objective:   Physical Exam Vitals reviewed.  Constitutional:      General: She is not in acute distress.    Appearance: Normal appearance. She is not ill-appearing.  HENT:     Head: Normocephalic and atraumatic.  Pulmonary:     Effort: Pulmonary effort is normal. No respiratory distress.  Abdominal:     General: There is no distension.     Palpations: Abdomen is soft.     Tenderness: There is no abdominal tenderness. There is no right CVA tenderness, left CVA tenderness or guarding.  Skin:    General: Skin is warm and dry.  Neurological:     Mental Status: She is alert.          Assessment & Plan:  UTI- pt's tx w/ Keflex has failed b/c bacteria is not susceptible to Keflex.  Will switch to Keflex as pt has had better results w/ this in the past.  Pt expressed understanding and is in agreement w/ plan.

## 2021-10-16 ENCOUNTER — Telehealth: Payer: Self-pay

## 2021-10-16 NOTE — Telephone Encounter (Signed)
OK to restart Gattex at that dose

## 2021-10-16 NOTE — Telephone Encounter (Signed)
Ebony called from [facility] and needs an okay for Shada to restart the Gattex 3.'31mg'$   daily. I can either call it in or send it thru the system to them. Please advise. Carolyn is ready to restart it.

## 2021-10-17 MED ORDER — TEDUGLUTIDE (RDNA) 5 MG ~~LOC~~ KIT: 3.3100 [IU] | PACK | Freq: Every morning | SUBCUTANEOUS | 0 refills | Status: AC

## 2021-10-17 NOTE — Telephone Encounter (Signed)
Refill for Gattex given verbally to Sharrie Rothman the pharmacist with [facility].

## 2021-10-21 ENCOUNTER — Encounter: Payer: Self-pay | Admitting: Internal Medicine

## 2021-10-23 ENCOUNTER — Other Ambulatory Visit: Payer: Self-pay | Admitting: *Deleted

## 2021-10-23 DIAGNOSIS — D638 Anemia in other chronic diseases classified elsewhere: Secondary | ICD-10-CM

## 2021-10-23 NOTE — Progress Notes (Signed)
Received call from pharmacy that handles pt TPN [phone]) requesting Mag, prealbumin and phosphorus be added to pt lab draw for tomorrow.  States they have access to epic and this would help prevent pt being stuck again later this week.  Per MD okay to proceed with lab orders.

## 2021-10-24 ENCOUNTER — Inpatient Hospital Stay: Payer: Medicare Other

## 2021-10-24 ENCOUNTER — Inpatient Hospital Stay (HOSPITAL_BASED_OUTPATIENT_CLINIC_OR_DEPARTMENT_OTHER): Payer: Medicare Other | Admitting: Adult Health

## 2021-10-24 ENCOUNTER — Inpatient Hospital Stay: Payer: Medicare Other | Attending: Hematology and Oncology

## 2021-10-24 ENCOUNTER — Other Ambulatory Visit: Payer: Self-pay

## 2021-10-24 VITALS — BP 117/64 | HR 63 | Temp 98.1°F | Resp 16 | Ht 68.0 in | Wt 145.5 lb

## 2021-10-24 DIAGNOSIS — D696 Thrombocytopenia, unspecified: Secondary | ICD-10-CM | POA: Insufficient documentation

## 2021-10-24 DIAGNOSIS — N1831 Chronic kidney disease, stage 3a: Secondary | ICD-10-CM | POA: Diagnosis not present

## 2021-10-24 DIAGNOSIS — D638 Anemia in other chronic diseases classified elsewhere: Secondary | ICD-10-CM

## 2021-10-24 DIAGNOSIS — D631 Anemia in chronic kidney disease: Secondary | ICD-10-CM | POA: Diagnosis not present

## 2021-10-24 LAB — CBC WITH DIFFERENTIAL (CANCER CENTER ONLY)
Abs Immature Granulocytes: 0.01 10*3/uL (ref 0.00–0.07)
Basophils Absolute: 0.1 10*3/uL (ref 0.0–0.1)
Basophils Relative: 1 %
Eosinophils Absolute: 0.7 10*3/uL — ABNORMAL HIGH (ref 0.0–0.5)
Eosinophils Relative: 12 %
HCT: 32.1 % — ABNORMAL LOW (ref 36.0–46.0)
Hemoglobin: 10.2 g/dL — ABNORMAL LOW (ref 12.0–15.0)
Immature Granulocytes: 0 %
Lymphocytes Relative: 32 %
Lymphs Abs: 1.9 10*3/uL (ref 0.7–4.0)
MCH: 29.9 pg (ref 26.0–34.0)
MCHC: 31.8 g/dL (ref 30.0–36.0)
MCV: 94.1 fL (ref 80.0–100.0)
Monocytes Absolute: 0.5 10*3/uL (ref 0.1–1.0)
Monocytes Relative: 9 %
Neutro Abs: 2.8 10*3/uL (ref 1.7–7.7)
Neutrophils Relative %: 46 %
Platelet Count: 73 10*3/uL — ABNORMAL LOW (ref 150–400)
RBC: 3.41 MIL/uL — ABNORMAL LOW (ref 3.87–5.11)
RDW: 15.8 % — ABNORMAL HIGH (ref 11.5–15.5)
WBC Count: 5.9 10*3/uL (ref 4.0–10.5)
nRBC: 0 % (ref 0.0–0.2)

## 2021-10-24 LAB — IRON AND IRON BINDING CAPACITY (CC-WL,HP ONLY)
Iron: 21 ug/dL — ABNORMAL LOW (ref 28–170)
Saturation Ratios: 8 % — ABNORMAL LOW (ref 10.4–31.8)
TIBC: 277 ug/dL (ref 250–450)
UIBC: 256 ug/dL (ref 148–442)

## 2021-10-24 LAB — CMP (CANCER CENTER ONLY)
ALT: 32 U/L (ref 0–44)
AST: 35 U/L (ref 15–41)
Albumin: 3.3 g/dL — ABNORMAL LOW (ref 3.5–5.0)
Alkaline Phosphatase: 193 U/L — ABNORMAL HIGH (ref 38–126)
Anion gap: 5 (ref 5–15)
BUN: 30 mg/dL — ABNORMAL HIGH (ref 8–23)
CO2: 26 mmol/L (ref 22–32)
Calcium: 9 mg/dL (ref 8.9–10.3)
Chloride: 105 mmol/L (ref 98–111)
Creatinine: 1.09 mg/dL — ABNORMAL HIGH (ref 0.44–1.00)
GFR, Estimated: 54 mL/min — ABNORMAL LOW (ref >60)
Glucose, Bld: 97 mg/dL (ref 70–99)
Potassium: 4.2 mmol/L (ref 3.5–5.1)
Sodium: 136 mmol/L (ref 135–145)
Total Bilirubin: 0.4 mg/dL (ref 0.3–1.2)
Total Protein: 8.3 g/dL — ABNORMAL HIGH (ref 6.5–8.1)

## 2021-10-24 LAB — PHOSPHORUS: Phosphorus: 2.7 mg/dL (ref 2.5–4.6)

## 2021-10-24 LAB — PREALBUMIN: Prealbumin: 15.5 mg/dL — ABNORMAL LOW (ref 18–38)

## 2021-10-24 LAB — MAGNESIUM: Magnesium: 2.2 mg/dL (ref 1.7–2.4)

## 2021-10-24 MED ORDER — DARBEPOETIN ALFA 300 MCG/0.6ML IJ SOSY
300.0000 ug | PREFILLED_SYRINGE | Freq: Once | INTRAMUSCULAR | Status: AC
  Administered 2021-10-24: 300 ug via SUBCUTANEOUS
  Filled 2021-10-24: qty 0.6

## 2021-10-24 NOTE — Progress Notes (Signed)
[name] Cancer Follow up:    [name] Minium, MD [address] [place] [address]   DIAGNOSIS: Anemia of chronic disease  SUMMARY OF HEMATOLOGIC HISTORY:  Normochromic normocytic anemia due 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  Aranesp: 10 injection in 2014, 3 in 2015, and discontinued after needing just 1 in 2016. Anemia returned in 2022 and Aranesp restarted on 10/26/2020 at 396mg every 3 weeks for hemoglobin less than 11.   Iron Deficiency: IV venofer given x 2 beginning 09/16/2021 She underwent colonoscopy and upper endoscopy on 09/20/2021 that showed benign inflammation in the stomach, 1 benign colon polyp, 1 sessile serrated polyp with low to high-grade dysplasia, recall colonoscopy in 6 months Thrombocytopenia:  Her plt count is 100 at baseline.     CURRENT THERAPY: Aranesp, intermittent IV iron  INTERVAL HISTORY: [name] [name] 70 y.o. female returns for follow-up of her anemia of chronic disease.  This is related to her short gut syndrome and she continues on TPN for this with good tolerance.  Since her last visit she underwent colonoscopy and upper endoscopy which showed benign inflammation in the stomach and 1 benign colon polyp and 1 sessile serrated polyp with low to high-grade dysplasia.  She was recommended to repeat colonoscopy in 6 months.  She also received IV iron on May 8 and Sep 23, 2021 she tolerated this well.  She denies any vaginal bleeding or dark tarry stool.  Overall she tells me that she tolerates the Aranesp injections well and has no concerns around taking these.  Her hemoglobin today is 10.2.   Patient Active Problem List   Diagnosis Date Noted   STEC (Shiga toxin-producing Escherichia coli) 07/01/2021    Streptococcal sepsis, unspecified ([facility] 06/30/2021   Left arm cellulitis 06/29/2021   Cellulitis of left breast 06/29/2021   Humerus fracture - right 06/29/2021   New onset a-fib ([facility] 06/29/2021   Carotid artery stenosis 10/31/2020   Macrocytic anemia 10/27/2020   NAFLD (nonalcoholic fatty liver disease) 09/21/2020   Stroke (cerebrum) ([facility] 07/25/2020   History of CVA (cerebrovascular accident) 07/24/2020   Thrombocytopenia ([facility] 07/24/2020   Chronic renal failure, stage 3a ([facility] 07/24/2020   Pressure injury of skin 07/24/2020   Stroke ([facility] 07/23/2020   Carnitine deficiency ([facility] 05/25/2018   Hypotension 12/30/2017   Bradycardia 12/30/2017   Deep venous thrombosis ([facility] left subclavian vein 07/31/2017   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[facility] 07/31/2017   Degenerative joint disease 07/08/2016   PICC line infection 05/16/2015   H/O mesenteric infarction 11/17/2013   Splenomegaly    Hx of adenomatous colonic polyps 177/93/9030  Chronic systolic heart failure (HCC) - LVEF 35-40% 12/25/2011   Hypokalemia 12/14/2011   Acute renal failure superimposed on stage 3a chronic kidney disease ([facility] 12/08/2011   Hyponatremia 12/08/2011   Osteoporosis 03/14/2010   Anemia of chronic disease 10/01/2009   GERD (gastroesophageal reflux disease) 11/19/2008   ALKALINE PHOSPHATASE, ELEVATED 10/12/2008   VITAMIN D DEFICIENCY 01/19/2008   TRANSAMINASES, SERUM, ELEVATED 01/19/2008   Bacterial overgrowth syndrome 11/19/2004   Acquired short bowel syndrome 11/20/2003    has No Known Allergies.  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   Carnitine deficiency ([place]) 05/25/2018   Carotid stenosis     Carotid US (9/15):  R 40-59%; L 1-39% >> FU 1 year   Cataract    Closed left subtrochanteric femur fracture ([place]) 12/31/2017   Clotting disorder ([place])    Congestive heart failure (CHF) (HCC)    EF 25-30%   COVID-19 2022   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   Fever 10/29/2020   Fracture of left clavicle    GERD (gastroesophageal reflux disease)    History of blood transfusion 2013   anemia   Hx of cardiovascular stress test    Myoview (9/15):  inf-apical scar; no ischemia; EF 47% - low risk    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 12/12/2011   Osteoporosis    Pancytopenia 10/07/2011   Pathologic fracture of neck of femur ([place])    Personal history of colonic polyps    Renal insufficiency    hx of yrs ago   Serratia marcescens infection - bactermia assoc w/ PICC 01/18/2015   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HCC)    Splenomegaly    By ultrasound   Stroke ([place]) 07/2020   Thrombophilia ([place])    Vitamin D deficiency     SURGICAL HISTORY: Past Surgical History:  Procedure Laterality Date   APPENDECTOMY  yrs ago   CHOLECYSTECTOMY  yrs ago   COLONOSCOPY  12/05/2005   internal hemorrhoids (for polyp surveillance)   COLONOSCOPY  04/26/2012   Procedure: COLONOSCOPY;  Surgeon: Gatha Mayer, MD;  Location: [facility];  Service: Endoscopy;  Laterality: N/A;   COLONOSCOPY WITH PROPOFOL N/A 03/18/2016   Procedure: COLONOSCOPY WITH PROPOFOL;  Surgeon: Gatha Mayer, MD;  Location: [facility];  Service: Endoscopy;  Laterality: N/A;   ESOPHAGOGASTRODUODENOSCOPY  01/22/2009   erosive esophagitis   HARDWARE REMOVAL Left 12/31/2017   Procedure: HARDWARE REMOVAL;  Surgeon: Rod Can, MD;  Location: [facility];  Service: Orthopedics;  Laterality: Left;   INTRAMEDULLARY (IM) NAIL INTERTROCHANTERIC Left 12/31/2017   Procedure: INTRAMEDULLARY (IM) NAIL SUBTROCHANTRIC;  Surgeon: Rod Can, MD;  Location: [facility];  Service:  Orthopedics;  Laterality: Left;   IR CV LINE INJECTION  03/23/2018   IR CV LINE INJECTION  10/02/2021   IR FLUORO GUIDE CV LINE LEFT  01/01/2018   IR FLUORO GUIDE CV LINE LEFT  03/24/2018   IR FLUORO GUIDE CV LINE LEFT  05/21/2018   IR FLUORO GUIDE CV LINE LEFT  07/09/2018   IR FLUORO GUIDE CV LINE LEFT  12/28/2020   IR FLUORO GUIDE CV LINE LEFT  07/02/2021   IR FLUORO GUIDE CV LINE RIGHT  11/01/2020   IR GENERIC HISTORICAL  07/04/2016   IR REMOVAL TUN CV CATH W/O FL 07/04/2016 Ascencion Dike, PA-C [facility]   IR GENERIC HISTORICAL  07/10/2016   IR US GUIDE VASC ACCESS LEFT 07/10/2016 Arne Cleveland, MD [facility]   IR GENERIC HISTORICAL  07/10/2016   IR FLUORO GUIDE CV LINE LEFT 07/10/2016 Arne Cleveland, MD [facility]   IR PATIENT EVAL TECH 0-60 MINS  10/07/2017   IR PATIENT EVAL TECH 0-60 MINS  03/09/2018   IR RADIOLOGIST EVAL &  MGMT  07/21/2017   IR RADIOLOGIST EVAL & MGMT  11/14/2020   IR RADIOLOGIST EVAL & MGMT  11/27/2020   IR RADIOLOGIST EVAL & MGMT  01/24/2021   IR REMOVAL TUN CV CATH W/O FL  12/28/2020   IR TRANSCATH PLC STENT  INITIAL VEIN  INC ANGIOPLASTY  12/28/2020   IR US GUIDE VASC ACCESS LEFT  11/01/2020   IR US GUIDE VASC ACCESS LEFT  12/28/2020   IR US GUIDE VASC ACCESS LEFT  12/28/2020   IR US GUIDE VASC ACCESS LEFT  12/28/2020   IR US GUIDE VASC ACCESS RIGHT  11/01/2020   IR VENO/EXT/UNI LEFT  11/01/2020   IR VENOCAVAGRAM SVC  12/28/2020   LEFT HEART CATH AND CORONARY ANGIOGRAPHY N/A 06/26/2020   Procedure: LEFT HEART CATH AND CORONARY ANGIOGRAPHY;  Surgeon: Jolaine Artist, MD;  Location: [facility];  Service: Cardiovascular;  Laterality: N/A;   ORIF PROXIMAL FEMORAL FRACTURE W/ ITST NAIL SYSTEM  03/2007   left, Dr. Shellia Carwin   SMALL INTESTINE SURGERY  2005   multiple with right colon resection for ischemia/infarct    SOCIAL HISTORY: Social History   Socioeconomic History   Marital status: Married    Spouse name: Not on file   Number of children: 1   Years of  education: Not on file   Highest education level: Not on file  Occupational History   Not on file  Tobacco Use   Smoking status: Never   Smokeless tobacco: Never  Vaping Use   Vaping Use: Never used  Substance and Sexual Activity   Alcohol use: Never   Drug use: Never   Sexual activity: Yes    Comment: 1st intercourse 24 yo-5 partners  Other Topics Concern   Not on file  Social History Narrative   ** Merged History Encounter **       Married to Herbie [place], has 1 daughter and a granddaughter the patient's son died in childhood due to a motor vehicle wreck 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 No alcohol tobacco or drug use 04/07/2017    Social Determinants of Health   Financial Resource Strain: Medium Risk (12/30/2017)   Overall Financial Resource Strain (CARDIA)    Difficulty of Paying Living Expenses: Somewhat hard  Food Insecurity: No Food Insecurity (12/30/2017)   Hunger Vital Sign    Worried About Running Out of Food in the Last Year: Never true    Ran Out of Food in the Last Year: Never true  Transportation Needs: No Transportation Needs (04/13/2020)   PRAPARE - Hydrologist (Medical): No    Lack of Transportation (Non-Medical): No  Physical Activity: Insufficiently Active (12/30/2017)   Exercise Vital Sign    Days of Exercise per Week: 7 days    Minutes of Exercise per Session: 20 min  Stress: No Stress Concern Present (12/30/2017)   [place]    Feeling of Stress : Not at all  Social Connections: Moderately Integrated (12/30/2017)   Social Connection and Isolation Panel [NHANES]    Frequency of Communication with Friends and Family: More than three times a week    Frequency of Social Gatherings with Friends and Family: Three times a week    Attends Religious Services: 1 to 4 times per year    Active Member of Clubs or Organizations: No     Attends Archivist Meetings: Never  Marital Status: Married  Human resources officer Violence: Not on file    FAMILY HISTORY: Family History  Problem Relation Age of Onset   Diabetes Mother    Hypertension Mother    AAA (abdominal aortic aneurysm) Mother    Colon cancer Neg Hx    Stomach cancer Neg Hx     Review of Systems  Constitutional:  Negative for appetite change, chills, fatigue, fever and unexpected weight change.  HENT:   Negative for hearing loss, lump/mass and trouble swallowing.   Eyes:  Negative for eye problems and icterus.  Respiratory:  Negative for chest tightness, cough and shortness of breath.   Cardiovascular:  Negative for chest pain, leg swelling and palpitations.  Gastrointestinal:  Negative for abdominal distention, abdominal pain, constipation, diarrhea, nausea and vomiting.  Endocrine: Negative for hot flashes.  Genitourinary:  Negative for difficulty urinating.   Musculoskeletal:  Negative for arthralgias.  Skin:  Negative for itching and rash.  Neurological:  Negative for dizziness, extremity weakness, headaches and numbness.  Hematological:  Negative for adenopathy. Does not bruise/bleed easily.  Psychiatric/Behavioral:  Negative for depression. The patient is not nervous/anxious.       PHYSICAL EXAMINATION  ECOG PERFORMANCE STATUS: 1 - Symptomatic but completely ambulatory  Vitals:   10/24/21 1421  BP: 117/64  Pulse: 63  Resp: 16  Temp: 98.1 F (36.7 C)  SpO2: 99%    Physical Exam Constitutional:      General: She is not in acute distress.    Appearance: Normal appearance. She is not toxic-appearing.  HENT:     Head: Normocephalic and atraumatic.  Eyes:     General: No scleral icterus. Cardiovascular:     Rate and Rhythm: Normal rate and regular rhythm.     Pulses: Normal pulses.     Heart sounds: Normal heart sounds.  Pulmonary:     Effort: Pulmonary effort is normal.     Breath sounds: Normal breath sounds.   Abdominal:     General: Abdomen is flat. Bowel sounds are normal. There is no distension.     Palpations: Abdomen is soft.     Tenderness: There is no abdominal tenderness.  Musculoskeletal:        General: No swelling.     Cervical back: Neck supple.  Lymphadenopathy:     Cervical: No cervical adenopathy.  Skin:    General: Skin is warm and dry.     Findings: No rash.  Neurological:     General: No focal deficit present.     Mental Status: She is alert.  Psychiatric:        Mood and Affect: Mood normal.        Behavior: Behavior normal.     LABORATORY DATA:  CBC    Component Value Date/Time   WBC 5.9 10/24/2021 1403   WBC 4.4 10/09/2021 1145   RBC 3.41 (L) 10/24/2021 1403   HGB 10.2 (L) 10/24/2021 1403   HGB 7.6 (L) 06/11/2021 1458   HGB 9.9 (L) 05/02/2014 1200   HCT 32.1 (L) 10/24/2021 1403   HCT 23.5 (L) 06/11/2021 1458   HCT 30.8 (L) 05/02/2014 1200   PLT 73 (L) 10/24/2021 1403   PLT 99 (LL) 06/11/2021 1458   MCV 94.1 10/24/2021 1403   MCV 91 06/11/2021 1458   MCV 96.3 05/02/2014 1200   MCH 29.9 10/24/2021 1403   MCHC 31.8 10/24/2021 1403   RDW 15.8 (H) 10/24/2021 1403   RDW 13.3 06/11/2021 1458   RDW 14.2 05/02/2014  1200   LYMPHSABS 1.9 10/24/2021 1403   LYMPHSABS 1.9 05/02/2014 1200   MONOABS 0.5 10/24/2021 1403   MONOABS 0.5 05/02/2014 1200   EOSABS 0.7 (H) 10/24/2021 1403   EOSABS 0.2 05/02/2014 1200   EOSABS 0.4 03/05/2010 0857   BASOSABS 0.1 10/24/2021 1403   BASOSABS 0.0 05/02/2014 1200    CMP     Component Value Date/Time   NA 136 10/24/2021 1403   NA 139 01/13/2014 0951   K 4.2 10/24/2021 1403   K 3.6 01/13/2014 0951   CL 105 10/24/2021 1403   CL 106 10/22/2012 1005   CO2 26 10/24/2021 1403   CO2 21 (L) 01/13/2014 0951   GLUCOSE 97 10/24/2021 1403   GLUCOSE 95 01/13/2014 0951   GLUCOSE 95 10/22/2012 1005   BUN 30 (H) 10/24/2021 1403   BUN 14.6 01/13/2014 0951   CREATININE 1.09 (H) 10/24/2021 1403   CREATININE 1.0 01/13/2014 0951    CALCIUM 9.0 10/24/2021 1403   CALCIUM 8.9 01/13/2014 0951   PROT 8.3 (H) 10/24/2021 1403   PROT 8.1 01/13/2014 0951   ALBUMIN 3.3 (L) 10/24/2021 1403   ALBUMIN 3.4 (L) 01/13/2014 0951   AST 35 10/24/2021 1403   AST 19 01/13/2014 0951   ALT 32 10/24/2021 1403   ALT 20 01/13/2014 0951   ALKPHOS 193 (H) 10/24/2021 1403   ALKPHOS 85 01/13/2014 0951   BILITOT 0.4 10/24/2021 1403   BILITOT 0.26 01/13/2014 0951   GFRNONAA 54 (L) 10/24/2021 1403   GFRAA 59 (L) 12/08/2018 2031      ASSESSMENT and THERAPY PLAN:   Anemia of chronic disease [name] [name] is here for follow-up of her anemia of chronic disease.  She will proceed with Aranesp injection today.  We are waiting on her lab testing to follow-up with her ferritin since receiving IV Venofer.  We will continue to follow her closely and she will return in 3 weeks for labs and possible injection.   All questions were answered. The patient knows to call the clinic with any problems, questions or concerns. We can certainly see the patient much sooner if necessary.  Total encounter time:20 minutes*in face-to-face visit time, chart review, lab review, care coordination, order entry, and documentation of the encounter time.    Wilber Bihari, NP 10/25/21 8:43 AM Medical Oncology and Hematology [facility] [place], [place] [address] Tel. [phone]    Fax. [phone]  *Total Encounter Time as defined by the Centers for Medicare and Medicaid Services includes, in addition to the face-to-face time of a patient visit (documented in the note above) non-face-to-face time: obtaining and reviewing outside history, ordering and reviewing medications, tests or procedures, care coordination (communications with other health care professionals or caregivers) and documentation in the medical record.

## 2021-10-25 ENCOUNTER — Telehealth: Payer: Self-pay | Admitting: Adult Health

## 2021-10-25 ENCOUNTER — Encounter: Payer: Self-pay | Admitting: Hematology and Oncology

## 2021-10-25 LAB — FERRITIN: Ferritin: 191 ng/mL (ref 11–307)

## 2021-10-25 NOTE — Telephone Encounter (Signed)
Scheduled appointment per 6/15 los. Patient is aware.

## 2021-10-25 NOTE — Assessment & Plan Note (Signed)
[name] [name] is here for follow-up of her anemia of chronic disease.  She will proceed with Aranesp injection today.  We are waiting on her lab testing to follow-up with her ferritin since receiving IV Venofer.  We will continue to follow her closely and she will return in 3 weeks for labs and possible injection.

## 2021-10-29 ENCOUNTER — Ambulatory Visit: Payer: Medicare Other

## 2021-10-29 ENCOUNTER — Encounter (HOSPITAL_COMMUNITY): Payer: Medicare Other

## 2021-10-31 ENCOUNTER — Ambulatory Visit
Admission: RE | Admit: 2021-10-31 | Discharge: 2021-10-31 | Disposition: A | Discharge status: Home / Self Care | Payer: Medicare Other | Source: Ambulatory Visit | Attending: Emergency Medicine | Admitting: Emergency Medicine

## 2021-10-31 VITALS — BP 111/69 | HR 89 | Temp 97.9°F | Resp 16

## 2021-10-31 DIAGNOSIS — N390 Urinary tract infection, site not specified: Secondary | ICD-10-CM | POA: Diagnosis not present

## 2021-10-31 LAB — POCT URINALYSIS DIP (MANUAL ENTRY)
Bilirubin, UA: NEGATIVE
Glucose, UA: NEGATIVE mg/dL
Ketones, POC UA: NEGATIVE mg/dL
Nitrite, UA: POSITIVE — AB
Spec Grav, UA: 1.01 (ref 1.010–1.025)
Urobilinogen, UA: 0.2 E.U./dL
pH, UA: 6 (ref 5.0–8.0)

## 2021-10-31 MED ORDER — FLUCONAZOLE 150 MG PO TABS: ORAL_TABLET | ORAL | 0 refills | Status: DC

## 2021-10-31 MED ORDER — CEFTRIAXONE SODIUM 1 G IJ SOLR
1.0000 g | Freq: Once | INTRAMUSCULAR | Status: AC
  Administered 2021-10-31: 1 g via INTRAMUSCULAR

## 2021-10-31 MED ORDER — SULFAMETHOXAZOLE-TRIMETHOPRIM 800-160 MG PO TABS: 1.0000 | ORAL_TABLET | Freq: Two times a day (BID) | ORAL | 0 refills | Status: DC

## 2021-10-31 MED ORDER — SULFAMETHOXAZOLE-TRIMETHOPRIM 800-160 MG PO TABS: 1.0000 | ORAL_TABLET | Freq: Two times a day (BID) | ORAL | 0 refills | Status: AC

## 2021-10-31 NOTE — ED Provider Notes (Addendum)
[facility]    CSN: [id number] Arrival date & time: 10/31/21  6100    HISTORY   Chief Complaint  Patient presents with   Urinary Frequency    Entered by patient   HPI [name] [name] is a 71 y.o. female. Patient presents to urgent care complaining of increased frequency of urination and lower back pain that began about 3 weeks ago.  Patient initially saw her primary care provider on May 28.  Patient was diagnosed empirically with a UTI based on urine dip results and her symptoms and treated empirically with Keflex.  3 days later, her urine culture results revealed Enterobacter aerogenes which was resistant to Keflex.  Patient was changed from Keflex to ciprofloxacin 500 mg twice daily for 5 days on June 1.  Urine dip today continues to be nitrite positive.  Patient states she continues to have lower back pain on the left side which has not gotten any worse than it was 3 weeks ago.  Patient denies fever, aches, chills.  Patient is accompanied by her husband today who states she has not shown any signs of altered mental status.  The history is provided by the patient.   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   Carnitine deficiency (HCC) 05/25/2018   Carotid stenosis    Carotid US (9/15):  R 40-59%; L 1-39% >> FU 1 year   Cataract    Closed left subtrochanteric femur fracture (HCC) 12/31/2017   Clotting disorder (HCC)    Congestive heart failure (CHF) (HCC)    EF 25-30%   COVID-19 2022   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   Fever 10/29/2020   Fracture of left clavicle    GERD (gastroesophageal reflux disease)    History of blood transfusion 2013    anemia   Hx of cardiovascular stress test    Myoview (9/15):  inf-apical scar; no ischemia; EF 47% - low risk    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 12/12/2011   Osteoporosis    Pancytopenia 10/07/2011   Pathologic fracture of neck of femur (HCC)    Personal history of colonic polyps    Renal insufficiency    hx of yrs ago   Serratia marcescens infection - bactermia assoc w/ PICC 01/18/2015   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HCC)    Splenomegaly    By ultrasound   Stroke (HCC) 07/2020   Thrombophilia (HCC)    Vitamin D deficiency    Patient Active Problem List   Diagnosis Date Noted   STEC (Shiga toxin-producing Escherichia coli) 07/01/2021   Streptococcal sepsis, unspecified (HCC) 06/30/2021   Left arm cellulitis 06/29/2021   Cellulitis of left breast 06/29/2021   Humerus fracture - right 06/29/2021   New onset a-fib (HCC) 06/29/2021   Carotid artery stenosis 10/31/2020   Macrocytic anemia 10/27/2020   NAFLD (nonalcoholic fatty liver disease) 42/90/6991   Stroke (cerebrum) (HCC) 07/25/2020   History of CVA (cerebrovascular accident) 07/24/2020   Thrombocytopenia (HCC) 07/24/2020   Chronic renal failure, stage 3a (HCC) 07/24/2020   Pressure injury of skin 07/24/2020  Stroke ([place]) 07/23/2020   Carnitine deficiency ([place]) 05/25/2018   Hypotension 12/30/2017   Bradycardia 12/30/2017   Deep venous thrombosis ([place]) left subclavian vein 07/31/2017   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[place]) 07/31/2017   Degenerative joint disease 07/08/2016   PICC line infection 05/16/2015   H/O mesenteric infarction 11/17/2013   Splenomegaly    Hx of adenomatous colonic polyps 78/67/6720   Chronic systolic heart failure (HCC) - LVEF 35-40% 12/25/2011   Hypokalemia 12/14/2011   Acute renal failure superimposed on stage 3a chronic kidney disease ([place]) 12/08/2011   Hyponatremia 12/08/2011   Osteoporosis 03/14/2010   Anemia of chronic disease 10/01/2009   GERD  (gastroesophageal reflux disease) 11/19/2008   ALKALINE PHOSPHATASE, ELEVATED 10/12/2008   VITAMIN D DEFICIENCY 01/19/2008   TRANSAMINASES, SERUM, ELEVATED 01/19/2008   Bacterial overgrowth syndrome 11/19/2004   Acquired short bowel syndrome 11/20/2003   Past Surgical History:  Procedure Laterality Date   APPENDECTOMY  yrs ago   CHOLECYSTECTOMY  yrs ago   COLONOSCOPY  12/05/2005   internal hemorrhoids (for polyp surveillance)   COLONOSCOPY  04/26/2012   Procedure: COLONOSCOPY;  Surgeon: Gatha Mayer, MD;  Location: [facility];  Service: Endoscopy;  Laterality: N/A;   COLONOSCOPY WITH PROPOFOL N/A 03/18/2016   Procedure: COLONOSCOPY WITH PROPOFOL;  Surgeon: Gatha Mayer, MD;  Location: [facility];  Service: Endoscopy;  Laterality: N/A;   ESOPHAGOGASTRODUODENOSCOPY  01/22/2009   erosive esophagitis   HARDWARE REMOVAL Left 12/31/2017   Procedure: HARDWARE REMOVAL;  Surgeon: Rod Can, MD;  Location: [facility];  Service: Orthopedics;  Laterality: Left;   INTRAMEDULLARY (IM) NAIL INTERTROCHANTERIC Left 12/31/2017   Procedure: INTRAMEDULLARY (IM) NAIL SUBTROCHANTRIC;  Surgeon: Rod Can, MD;  Location: [facility];  Service: Orthopedics;  Laterality: Left;   IR CV LINE INJECTION  03/23/2018   IR CV LINE INJECTION  10/02/2021   IR FLUORO GUIDE CV LINE LEFT  01/01/2018   IR FLUORO GUIDE CV LINE LEFT  03/24/2018   IR FLUORO GUIDE CV LINE LEFT  05/21/2018   IR FLUORO GUIDE CV LINE LEFT  07/09/2018   IR FLUORO GUIDE CV LINE LEFT  12/28/2020   IR FLUORO GUIDE CV LINE LEFT  07/02/2021   IR FLUORO GUIDE CV LINE RIGHT  11/01/2020   IR GENERIC HISTORICAL  07/04/2016   IR REMOVAL TUN CV CATH W/O FL 07/04/2016 Ascencion Dike, PA-C [facility]   IR GENERIC HISTORICAL  07/10/2016   IR US GUIDE VASC ACCESS LEFT 07/10/2016 Arne Cleveland, MD [facility]   IR GENERIC HISTORICAL  07/10/2016   IR FLUORO GUIDE CV LINE LEFT 07/10/2016 Arne Cleveland, MD [facility]   IR PATIENT EVAL TECH 0-60 MINS  10/07/2017    IR PATIENT EVAL TECH 0-60 MINS  03/09/2018   IR RADIOLOGIST EVAL & MGMT  07/21/2017   IR RADIOLOGIST EVAL & MGMT  11/14/2020   IR RADIOLOGIST EVAL & MGMT  11/27/2020   IR RADIOLOGIST EVAL & MGMT  01/24/2021   IR REMOVAL TUN CV CATH W/O FL  12/28/2020   IR TRANSCATH PLC STENT  INITIAL VEIN  INC ANGIOPLASTY  12/28/2020   IR US GUIDE VASC ACCESS LEFT  11/01/2020   IR US GUIDE VASC ACCESS LEFT  12/28/2020   IR US GUIDE VASC ACCESS LEFT  12/28/2020   IR US GUIDE VASC ACCESS LEFT  12/28/2020   IR US GUIDE VASC ACCESS RIGHT  11/01/2020   IR VENO/EXT/UNI LEFT  11/01/2020   IR VENOCAVAGRAM SVC  12/28/2020   LEFT HEART CATH AND  CORONARY ANGIOGRAPHY N/A 06/26/2020   Procedure: LEFT HEART CATH AND CORONARY ANGIOGRAPHY;  Surgeon: Jolaine Artist, MD;  Location: [facility];  Service: Cardiovascular;  Laterality: N/A;   ORIF PROXIMAL FEMORAL FRACTURE W/ ITST NAIL SYSTEM  03/2007   left, Dr. Shellia Carwin   SMALL INTESTINE SURGERY  2005   multiple with right colon resection for ischemia/infarct   OB History     Gravida  2   Para  2   Term  0   Preterm  0   AB  0   Living         SAB  0   IAB  0   Ectopic  0   Multiple      Live Births             Home Medications    Prior to Admission medications   Medication Sig Start Date End Date Taking? Authorizing Provider  acetaminophen (TYLENOL) 500 MG tablet Take 1,000 mg by mouth daily as needed for mild pain.    [provider]  ADULT TPN Inject 1,800 mLs into the vein 4 (four) times a week. Pt receives home TPN from [facility]:  1800 mL bag, four nights weekly (Monday, Tuesday, Wednesday, Thursday for 8 hours (includes 1 hour taper up and down).    [provider]  baclofen (LIORESAL) 10 MG tablet Take 1 tablet (10 mg total) by mouth 2 (two) times daily as needed for muscle spasms. 08/29/20   Frann Rider, NP  Calcium Carb-Cholecalciferol (CALCIUM + D3 PO) Take 2 tablets by mouth daily.    [provider]   cetirizine (ZYRTEC) 10 MG tablet Take 1 tablet (10 mg total) by mouth at bedtime as needed for allergies. Patient not taking: Reported on 10/10/2021 12/19/20   Dutch Quint B, FNP  clobetasol ointment (TEMOVATE) 8.12 % Apply 1 application. topically 2 (two) times daily. 06/14/21   [provider]  diphenhydrAMINE (BENADRYL) 25 mg capsule Take 25-50 mg by mouth every 6 (six) hours as needed for itching or sleep.     [provider]  empagliflozin (JARDIANCE) 10 MG TABS tablet Take 1 tablet (10 mg total) by mouth daily before breakfast. 06/11/21   Bensimhon, Shaune Pascal, MD  ergocalciferol (DRISDOL) 200 MCG/ML drops Take 6 mLs (48,000 Units total) by mouth 3 (three) times a week. 08/21/21   Gatha Mayer, MD  fluticasone (FLONASE) 50 MCG/ACT nasal spray Place 2 sprays into both nostrils daily as needed for allergies or rhinitis.    [provider]  furosemide (LASIX) 20 MG tablet TAKE ONE TABLET BY MOUTH AS NEEDED FOR SWELLING 02/11/21   Bensimhon, Shaune Pascal, MD  Heparin Sodium, Porcine, (HEPARIN LOCK FLUSH IJ) Inject 5 mLs as directed 4 (four) times a week. Monday, Tuesday, Wednesday, Thursday in the morning with TPN    [provider]  JARDIANCE 10 MG TABS tablet Take 10 mg by mouth daily. 10/02/21   [provider]  losartan (COZAAR) 25 MG tablet Take 0.5 tablets (12.5 mg total) by mouth at bedtime. 12/18/20 06/11/22  Bensimhon, Shaune Pascal, MD  meclizine (ANTIVERT) 25 MG tablet Take 1 tablet (25 mg total) by mouth 3 (three) times daily as needed for dizziness. Patient not taking: Reported on 10/24/2021 04/29/21   Haydee Salter, MD  Multiple Vitamins-Minerals (ADULT GUMMY PO) Take 2 tablets by mouth daily.    [provider]  omeprazole (PRILOSEC) 40 MG capsule TAKE 1 CAPSULE BY MOUTH EVERY  DAY BEFORE BREAKFAST 06/28/21   Gatha Mayer, MD  ondansetron (ZOFRAN-ODT) 8 MG disintegrating tablet TAKE 1 TABLET BY MOUTH EVERY 8 HOURS AS NEEDED FOR NAUSEA OR  VOMITING. 09/09/21   Gatha Mayer, MD  potassium chloride SA (KLOR-CON M) 20 MEQ tablet Take 1 tablet (20 mEq total) by mouth 2 (two) times daily for 4 days. 08/15/21 08/19/21  Curatolo, Adam, DO  sodium chloride 0.9 % infusion Inject 900 mLs into the vein every 14 (fourteen) days. Uses for TPN 10/27/17   [provider]  spironolactone (ALDACTONE) 25 MG tablet TAKE 1 TABLET (25 MG TOTAL) BY MOUTH DAILY. 08/28/21   Bensimhon, Shaune Pascal, MD  Teduglutide, rDNA, 5 MG KIT Inject 3.31 Units into the skin every morning. 10/17/21   Gatha Mayer, MD  triamcinolone cream (KENALOG) 0.1 % Apply 1 application topically as needed (itching).    [provider]  vitamin E 1000 UNIT capsule Take 1,000 Units by mouth daily.    [provider]  XARELTO 20 MG TABS tablet TAKE 1 TABLET BY MOUTH DAILY WITH SUPPER. 08/30/21   [name], MD   Family History Family History  Problem Relation Age of Onset   Diabetes Mother    Hypertension Mother    AAA (abdominal aortic aneurysm) Mother    Colon cancer Neg Hx    Stomach cancer Neg Hx    Social History Social History   Tobacco Use   Smoking status: Never   Smokeless tobacco: Never  Vaping Use   Vaping Use: Never used  Substance Use Topics   Alcohol use: Never   Drug use: Never   Allergies   Patient has no known allergies.  Review of Systems Review of Systems Pertinent findings noted in history of present illness.   Physical Exam Triage Vital Signs ED Triage Vitals  Enc Vitals Group     BP 03/08/21 0827 (!) 147/82     Pulse Rate 03/08/21 0827 72     Resp 03/08/21 0827 18     Temp 03/08/21 0827 98.3 F (36.8 C)     Temp Source 03/08/21 0827 Oral     SpO2 03/08/21 0827 98 %     Weight --      Height --      Head Circumference --      Peak Flow --      Pain Score 03/08/21 0826 5     Pain Loc --      Pain Edu? --      Excl. in [place]? --   No data found.  Updated Vital Signs BP 111/69 (BP Location: Left Arm)    Pulse 89   Temp 97.9 F (36.6 C) (Oral)   Resp 16   LMP 02/26/2014   SpO2 95%   Physical Exam Vitals and nursing note reviewed.  Constitutional:      General: She is not in acute distress.    Appearance: Normal appearance. She is not ill-appearing.  HENT:     Head: Normocephalic and atraumatic.  Eyes:     General: Lids are normal.        Right eye: No discharge.        Left eye: No discharge.     Extraocular Movements: Extraocular movements intact.     Conjunctiva/sclera: Conjunctivae normal.     Right eye: Right conjunctiva is not injected.     Left eye: Left conjunctiva is not injected.  Neck:     Trachea: Trachea  and phonation normal.  Cardiovascular:     Rate and Rhythm: Normal rate and regular rhythm.     Pulses: Normal pulses.     Heart sounds: Normal heart sounds. No murmur heard.    No friction rub. No gallop.  Pulmonary:     Effort: Pulmonary effort is normal. No accessory muscle usage, prolonged expiration or respiratory distress.     Breath sounds: Normal breath sounds. No stridor, decreased air movement or transmitted upper airway sounds. No decreased breath sounds, wheezing, rhonchi or rales.  Chest:     Chest wall: No tenderness.  Abdominal:     General: Abdomen is flat. Bowel sounds are normal. There is no distension.     Palpations: Abdomen is soft.     Tenderness: There is abdominal tenderness in the suprapubic area. There is no right CVA tenderness or left CVA tenderness.     Hernia: No hernia is present.  Musculoskeletal:        General: Normal range of motion.     Cervical back: Normal range of motion and neck supple. Normal range of motion.  Lymphadenopathy:     Cervical: No cervical adenopathy.  Skin:    General: Skin is warm and dry.     Findings: No erythema or rash.  Neurological:     General: No focal deficit present.     Mental Status: She is alert and oriented to person, place, and time.  Psychiatric:        Mood and Affect: Mood normal.         Behavior: Behavior normal.     Visual Acuity Right Eye Distance:   Left Eye Distance:   Bilateral Distance:    Right Eye Near:   Left Eye Near:    Bilateral Near:     UC Couse / Diagnostics / Procedures:    EKG  Radiology No results found.  Procedures Procedures (including critical care time)  UC Diagnoses / Final Clinical Impressions(s)   I have reviewed the triage vital signs and the nursing notes.  Pertinent labs & imaging results that were available during my care of the patient were reviewed by me and considered in my medical decision making (see chart for details).    Final diagnoses:  Complicated urinary tract infection   Patient was insufficiently treated at her initial visit May 28 with Keflex which is not standard of care for uncomplicated urinary tract infections.  Fortunately, 3 days after Keflex was started urine culture identified Enterobacter aerogenes and patient was changed to ciprofloxacin 500 mg twice daily for 5 days.  Unfortunately, patient did not get relief with this either despite the Enterobacter being found to be susceptible to ciprofloxacin.  Today, patient continues to have nitrites in her urine.  We will treat patient aggressively with an injection of Rocephin during her visit today along with Bactrim for 14 days.  Patient advised that there is a good chance we will need to change her antibiotic based on the results of her urine culture.  Patient verbalized understanding of this.  Patient was also advised schedule appointment with her primary care provider for early next week for reevaluation and to keep the appointment whether she is symptomatic or not.  Patient advised to the emergency room should she develop fever, altered mental status or worsening flank pain.    ED Prescriptions     Medication Sig Dispense Auth. Provider   fluconazole (DIFLUCAN) 150 MG tablet Take 1 tablet on day 3of antibiotics.  Take second tablet 3 days later. 2  tablet Lynden Oxford Scales, PA-C   sulfamethoxazole-trimethoprim (BACTRIM DS) 800-160 MG tablet Take 1 tablet by mouth 2 (two) times daily for 14 days. 28 tablet Lynden Oxford Scales, PA-C      PDMP not reviewed this encounter.  Pending results:  Labs Reviewed  POCT URINALYSIS DIP (MANUAL ENTRY) - Abnormal; Notable for the following components:      Result Value   Clarity, UA cloudy (*)    Blood, UA small (*)    Protein Ur, POC trace (*)    Nitrite, UA Positive (*)    Leukocytes, UA Moderate (2+) (*)    All other components within normal limits    Medications Ordered in UC: Medications  cefTRIAXone (ROCEPHIN) injection 1 g (1 g Intramuscular Given 10/31/21 1029)    Disposition Upon Discharge:  Condition: stable for discharge home  Patient presented with concern for an acute illness with associated systemic symptoms and significant discomfort requiring urgent management. In my opinion, this is a condition that a prudent lay person (someone who possesses an average knowledge of health and medicine) may potentially expect to result in complications if not addressed urgently such as respiratory distress, impairment of bodily function or dysfunction of bodily organs.   As such, the patient has been evaluated and assessed, work-up was performed and treatment was provided in alignment with urgent care protocols and evidence based medicine.  Patient/parent/caregiver has been advised that the patient may require follow up for further testing and/or treatment if the symptoms continue in spite of treatment, as clinically indicated and appropriate.  Routine symptom specific, illness specific and/or disease specific instructions were discussed with the patient and/or caregiver at length.  Prevention strategies for avoiding STD exposure were also discussed.  The patient will follow up with their current PCP if and as advised. If the patient does not currently have a PCP we will assist them in  obtaining one.   The patient may need specialty follow up if the symptoms continue, in spite of conservative treatment and management, for further workup, evaluation, consultation and treatment as clinically indicated and appropriate.  Patient/parent/caregiver verbalized understanding and agreement of plan as discussed.  All questions were addressed during visit.  Please see discharge instructions below for further details of plan.  Discharge Instructions:   Discharge Instructions      The urinalysis that we performed in the clinic today was abnormal and clearly indicates that you are still suffering from a urinary tract infection.  It is my opinion that given your recent increase in urinary tract infections and recently taking Jardiance, Vania Rea may be the culprit causing these infections.    As we discussed, the way Jardiance works for diabetics is to force your kidneys to out of your blood into your urine so that you can "pee out your sugar".  Bacteria, of course, thrives on sugar which is why you are seeing a recent increase of infections in your urinary tract.  You may wish to discuss Jardiance with your provider to see if there are any alternatives for you.  Chronic, worsening urinary tract infections can become life-threatening and while this may seem only bothersome now, down the road this may become much more serious.  As before, urine culture will be performed per our protocol to isolate the specific bacterial organism that is causing this current infection.  90% of urinary tract infections in women is caused by some form of Enterobacter, most commonly Enterobacter coli but  your last urine culture actually revealed Enterobacter aerogenes.   The result of your urine culture will be available in the next 3 to 5 days and will be posted to your MyChart account.  If the bacteria present is found to be resistant to Bactrim, you will be contacted and a new prescription will be provided for  you.   In the meantime, you were provided with an injection of Rocephin during your visit today and a 14-day prescription for Bactrim, 1 tablet twice daily.  As it is still early in the day, I recommend that you try to take your first dose of Bactrim before today's out, try to take them at least 8 hours apart.    You were advised to begin antibiotics today because you are having active symptoms of a urinary tract infection.  As I am sure you are aware, it is very important that you take all doses exactly as prescribed.  Incomplete antibiotic therapy can cause worsening urinary tract infection that can become aggressive, reach the level of your kidneys causing kidney infection and possible hospitalization.   Because we know that antibiotic treatment can often cause vaginal yeast infections, I have also provided you with a prescription for fluconazole (Diflucan).  Please take the first tablet on the third day of your antibiotics and take the second tablet three days after the first tablet.  :-)   Please consider scheduling an appointment with your primary care provider now so that you can be reevaluated early next week.  It is always nicer to have an appointment that you do not need then to need an appointment that you do not have.  Thank you for visiting urgent care today.      This office note has been dictated using Museum/gallery curator.  Unfortunately, and despite my best efforts, this method of dictation can sometimes lead to occasional typographical or grammatical errors.  I apologize in advance if this occurs.      Lynden Oxford Scales, PA-C 10/31/21 1033    Lynden Oxford Scales, PA-C 10/31/21 1049

## 2021-10-31 NOTE — ED Triage Notes (Signed)
Pt c/o urinary urgency/ frequency and lower back pain that began 3 weeks ago (was taking abx for UTI but symptoms never stopped).

## 2021-10-31 NOTE — Discharge Instructions (Addendum)
The urinalysis that we performed in the clinic today was abnormal and clearly indicates that you are still suffering from a urinary tract infection.  It is my opinion that given your recent increase in urinary tract infections and recently taking Jardiance, Vania Rea may be the culprit causing these infections.    As we discussed, the way Jardiance works for diabetics is to force your kidneys to out of your blood into your urine so that you can "pee out your sugar".  Bacteria, of course, thrives on sugar which is why you are seeing a recent increase of infections in your urinary tract.  You may wish to discuss Jardiance with your provider to see if there are any alternatives for you.  Chronic, worsening urinary tract infections can become life-threatening and while this may seem only bothersome now, down the road this may become much more serious.  As before, urine culture will be performed per our protocol to isolate the specific bacterial organism that is causing this current infection.  90% of urinary tract infections in women is caused by some form of Enterobacter, most commonly Enterobacter coli but your last urine culture actually revealed Enterobacter aerogenes.   The result of your urine culture will be available in the next 3 to 5 days and will be posted to your MyChart account.  If the bacteria present is found to be resistant to Bactrim, you will be contacted and a new prescription will be provided for you.   In the meantime, you were provided with an injection of Rocephin during your visit today and a 14-day prescription for Bactrim, 1 tablet twice daily.  As it is still early in the day, I recommend that you try to take your first dose of Bactrim before today's out, try to take them at least 8 hours apart.    You were advised to begin antibiotics today because you are having active symptoms of a urinary tract infection.  As I am sure you are aware, it is very important that you take all doses  exactly as prescribed.  Incomplete antibiotic therapy can cause worsening urinary tract infection that can become aggressive, reach the level of your kidneys causing kidney infection and possible hospitalization.   Because we know that antibiotic treatment can often cause vaginal yeast infections, I have also provided you with a prescription for fluconazole (Diflucan).  Please take the first tablet on the third day of your antibiotics and take the second tablet three days after the first tablet.  :-)   Please consider scheduling an appointment with your primary care provider now so that you can be reevaluated early next week.  It is always nicer to have an appointment that you do not need then to need an appointment that you do not have.  Thank you for visiting urgent care today.

## 2021-11-14 ENCOUNTER — Inpatient Hospital Stay: Payer: Medicare Other | Attending: Hematology and Oncology

## 2021-11-14 ENCOUNTER — Other Ambulatory Visit: Payer: Self-pay

## 2021-11-14 ENCOUNTER — Telehealth: Payer: Self-pay

## 2021-11-14 ENCOUNTER — Inpatient Hospital Stay: Payer: Medicare Other

## 2021-11-14 VITALS — BP 117/65 | HR 56 | Temp 98.1°F | Resp 16

## 2021-11-14 DIAGNOSIS — D631 Anemia in chronic kidney disease: Secondary | ICD-10-CM | POA: Diagnosis not present

## 2021-11-14 DIAGNOSIS — D696 Thrombocytopenia, unspecified: Secondary | ICD-10-CM | POA: Diagnosis not present

## 2021-11-14 DIAGNOSIS — E611 Iron deficiency: Secondary | ICD-10-CM | POA: Insufficient documentation

## 2021-11-14 DIAGNOSIS — N1831 Chronic kidney disease, stage 3a: Secondary | ICD-10-CM | POA: Diagnosis not present

## 2021-11-14 DIAGNOSIS — K912 Postsurgical malabsorption, not elsewhere classified: Secondary | ICD-10-CM

## 2021-11-14 DIAGNOSIS — D638 Anemia in other chronic diseases classified elsewhere: Secondary | ICD-10-CM

## 2021-11-14 DIAGNOSIS — K760 Fatty (change of) liver, not elsewhere classified: Secondary | ICD-10-CM

## 2021-11-14 LAB — CMP (CANCER CENTER ONLY)
ALT: 22 U/L (ref 0–44)
AST: 35 U/L (ref 15–41)
Albumin: 3.2 g/dL — ABNORMAL LOW (ref 3.5–5.0)
Alkaline Phosphatase: 184 U/L — ABNORMAL HIGH (ref 38–126)
Anion gap: 4 — ABNORMAL LOW (ref 5–15)
BUN: 27 mg/dL — ABNORMAL HIGH (ref 8–23)
CO2: 26 mmol/L (ref 22–32)
Calcium: 8.9 mg/dL (ref 8.9–10.3)
Chloride: 104 mmol/L (ref 98–111)
Creatinine: 1.38 mg/dL — ABNORMAL HIGH (ref 0.44–1.00)
GFR, Estimated: 41 mL/min — ABNORMAL LOW (ref >60)
Glucose, Bld: 94 mg/dL (ref 70–99)
Potassium: 5.3 mmol/L — ABNORMAL HIGH (ref 3.5–5.1)
Sodium: 134 mmol/L — ABNORMAL LOW (ref 135–145)
Total Bilirubin: 0.4 mg/dL (ref 0.3–1.2)
Total Protein: 8.2 g/dL — ABNORMAL HIGH (ref 6.5–8.1)

## 2021-11-14 LAB — CBC WITH DIFFERENTIAL (CANCER CENTER ONLY)
Abs Immature Granulocytes: 0.01 10*3/uL (ref 0.00–0.07)
Basophils Absolute: 0.1 10*3/uL (ref 0.0–0.1)
Basophils Relative: 1 %
Eosinophils Absolute: 1.3 10*3/uL — ABNORMAL HIGH (ref 0.0–0.5)
Eosinophils Relative: 21 %
HCT: 33 % — ABNORMAL LOW (ref 36.0–46.0)
Hemoglobin: 10.4 g/dL — ABNORMAL LOW (ref 12.0–15.0)
Immature Granulocytes: 0 %
Lymphocytes Relative: 29 %
Lymphs Abs: 1.8 10*3/uL (ref 0.7–4.0)
MCH: 29.4 pg (ref 26.0–34.0)
MCHC: 31.5 g/dL (ref 30.0–36.0)
MCV: 93.2 fL (ref 80.0–100.0)
Monocytes Absolute: 0.5 10*3/uL (ref 0.1–1.0)
Monocytes Relative: 8 %
Neutro Abs: 2.6 10*3/uL (ref 1.7–7.7)
Neutrophils Relative %: 41 %
Platelet Count: 110 10*3/uL — ABNORMAL LOW (ref 150–400)
RBC: 3.54 MIL/uL — ABNORMAL LOW (ref 3.87–5.11)
RDW: 15.3 % (ref 11.5–15.5)
WBC Count: 6.2 10*3/uL (ref 4.0–10.5)
nRBC: 0 % (ref 0.0–0.2)

## 2021-11-14 LAB — TRIGLYCERIDES: Triglycerides: 77 mg/dL (ref <150)

## 2021-11-14 LAB — PHOSPHORUS: Phosphorus: 3.6 mg/dL (ref 2.5–4.6)

## 2021-11-14 LAB — MAGNESIUM: Magnesium: 2.1 mg/dL (ref 1.7–2.4)

## 2021-11-14 LAB — PREALBUMIN: Prealbumin: 17 mg/dL — ABNORMAL LOW (ref 18–38)

## 2021-11-14 MED ORDER — DARBEPOETIN ALFA 300 MCG/0.6ML IJ SOSY
300.0000 ug | PREFILLED_SYRINGE | Freq: Once | INTRAMUSCULAR | Status: AC
  Administered 2021-11-14: 300 ug via SUBCUTANEOUS
  Filled 2021-11-14: qty 0.6

## 2021-11-14 NOTE — Progress Notes (Signed)
Dr Carlean Purl called and requests we draw pt's routine labs in our office since pt is here for labs. Per his request, Dr Lindi Adie ordered CBC, CMP, MAG, PHOS, TRIGLYCERIDES, PREALBUMIN. Dr Carlean Purl is Cc'd on results.

## 2021-11-14 NOTE — Telephone Encounter (Signed)
Orders for months labs placed in Epic. Pt made aware: Pt stated that she will come on Monday to have labs drawn:

## 2021-11-18 ENCOUNTER — Ambulatory Visit (HOSPITAL_COMMUNITY): Admission: RE | Admit: 2021-11-18 | Payer: Medicare Other | Source: Ambulatory Visit

## 2021-11-18 ENCOUNTER — Other Ambulatory Visit (INDEPENDENT_AMBULATORY_CARE_PROVIDER_SITE_OTHER): Payer: Medicare Other

## 2021-11-18 DIAGNOSIS — K760 Fatty (change of) liver, not elsewhere classified: Secondary | ICD-10-CM

## 2021-11-18 DIAGNOSIS — K912 Postsurgical malabsorption, not elsewhere classified: Secondary | ICD-10-CM

## 2021-11-18 DIAGNOSIS — D638 Anemia in other chronic diseases classified elsewhere: Secondary | ICD-10-CM

## 2021-11-18 LAB — CBC WITH DIFFERENTIAL/PLATELET
Basophils Absolute: 0.1 10*3/uL (ref 0.0–0.1)
Basophils Relative: 1.3 % (ref 0.0–3.0)
Eosinophils Absolute: 1 10*3/uL — ABNORMAL HIGH (ref 0.0–0.7)
Eosinophils Relative: 18.5 % — ABNORMAL HIGH (ref 0.0–5.0)
HCT: 33.2 % — ABNORMAL LOW (ref 36.0–46.0)
Hemoglobin: 10.6 g/dL — ABNORMAL LOW (ref 12.0–15.0)
Lymphocytes Relative: 25.4 % (ref 12.0–46.0)
Lymphs Abs: 1.3 10*3/uL (ref 0.7–4.0)
MCHC: 31.9 g/dL (ref 30.0–36.0)
MCV: 93.6 fl (ref 78.0–100.0)
Monocytes Absolute: 0.5 10*3/uL (ref 0.1–1.0)
Monocytes Relative: 9 % (ref 3.0–12.0)
Neutro Abs: 2.4 10*3/uL (ref 1.4–7.7)
Neutrophils Relative %: 45.8 % (ref 43.0–77.0)
Platelets: 96 10*3/uL — ABNORMAL LOW (ref 150.0–400.0)
RBC: 3.55 Mil/uL — ABNORMAL LOW (ref 3.87–5.11)
RDW: 16 % — ABNORMAL HIGH (ref 11.5–15.5)
WBC: 5.2 10*3/uL (ref 4.0–10.5)

## 2021-11-18 LAB — COMPREHENSIVE METABOLIC PANEL
ALT: 39 U/L — ABNORMAL HIGH (ref 0–35)
AST: 53 U/L — ABNORMAL HIGH (ref 0–37)
Albumin: 3.3 g/dL — ABNORMAL LOW (ref 3.5–5.2)
Alkaline Phosphatase: 218 U/L — ABNORMAL HIGH (ref 39–117)
BUN: 23 mg/dL (ref 6–23)
CO2: 24 mEq/L (ref 19–32)
Calcium: 8.7 mg/dL (ref 8.4–10.5)
Chloride: 106 mEq/L (ref 96–112)
Creatinine, Ser: 1.17 mg/dL (ref 0.40–1.20)
GFR: 46.94 mL/min — ABNORMAL LOW (ref >60.00)
Glucose, Bld: 105 mg/dL — ABNORMAL HIGH (ref 70–99)
Potassium: 5.6 mEq/L — ABNORMAL HIGH (ref 3.5–5.1)
Sodium: 136 mEq/L (ref 135–145)
Total Bilirubin: 0.5 mg/dL (ref 0.2–1.2)
Total Protein: 8 g/dL (ref 6.0–8.3)

## 2021-11-18 LAB — TRIGLYCERIDES: Triglycerides: 80 mg/dL (ref 0.0–149.0)

## 2021-11-18 LAB — MAGNESIUM: Magnesium: 1.9 mg/dL (ref 1.5–2.5)

## 2021-11-19 ENCOUNTER — Telehealth: Payer: Self-pay | Admitting: Internal Medicine

## 2021-11-19 LAB — PREALBUMIN: Prealbumin: 17 mg/dL (ref 17–34)

## 2021-11-19 NOTE — Telephone Encounter (Signed)
Elana RN stated that she faxed documents over to be signed by Dr. Cecilie Kicks for pt TPN order. Dr. Carlean Purl signed prescription. Prescription faxed to Elana RN [phone]: Left Voice Mail for Elana RN  notifying fax has been sent

## 2021-11-19 NOTE — Telephone Encounter (Signed)
Alden Benjamin (?) from [facility] called regarding patient and would like you to return her call at [phone].  Thank you.

## 2021-11-29 ENCOUNTER — Ambulatory Visit (INDEPENDENT_AMBULATORY_CARE_PROVIDER_SITE_OTHER): Payer: Medicare Other

## 2021-11-29 ENCOUNTER — Ambulatory Visit
Admission: RE | Admit: 2021-11-29 | Discharge: 2021-11-29 | Disposition: A | Discharge status: Home / Self Care | Payer: Medicare Other | Source: Ambulatory Visit | Attending: Urgent Care | Admitting: Urgent Care

## 2021-11-29 ENCOUNTER — Ambulatory Visit (HOSPITAL_COMMUNITY): Payer: Self-pay

## 2021-11-29 VITALS — BP 114/70 | HR 62 | Temp 97.9°F | Resp 16

## 2021-11-29 DIAGNOSIS — I509 Heart failure, unspecified: Secondary | ICD-10-CM | POA: Diagnosis not present

## 2021-11-29 DIAGNOSIS — N183 Chronic kidney disease, stage 3 unspecified: Secondary | ICD-10-CM | POA: Diagnosis not present

## 2021-11-29 DIAGNOSIS — R0602 Shortness of breath: Secondary | ICD-10-CM

## 2021-11-29 DIAGNOSIS — R350 Frequency of micturition: Secondary | ICD-10-CM | POA: Diagnosis not present

## 2021-11-29 LAB — POCT URINALYSIS DIP (MANUAL ENTRY)
Bilirubin, UA: NEGATIVE
Glucose, UA: 100 mg/dL — AB
Ketones, POC UA: NEGATIVE mg/dL
Leukocytes, UA: NEGATIVE
Nitrite, UA: NEGATIVE
Protein Ur, POC: NEGATIVE mg/dL
Spec Grav, UA: 1.015 (ref 1.010–1.025)
Urobilinogen, UA: 0.2 E.U./dL
pH, UA: 8 (ref 5.0–8.0)

## 2021-11-29 MED ORDER — FUROSEMIDE 40 MG PO TABS
ORAL_TABLET | ORAL | 0 refills | Status: DC
Start: 2021-11-29 — End: 2021-12-02

## 2021-11-29 NOTE — ED Notes (Signed)
UTI symptoms 2 months has been center multiple times - lower back pain, urinary frequency  SOB- started two nights ago, dry cough that patient states she has to sit up to catch her breath  Out of lasix, has an appt 8/7 with cardiologist, Right leg is swelling and patient states it feels like when she has fluid build up.

## 2021-11-29 NOTE — Discharge Instructions (Addendum)
Please make your follow up appointment with NP Clegg on Monday at 1:00pm.

## 2021-11-29 NOTE — ED Triage Notes (Addendum)
The pt c/o lower back pain and urinary frequency intermittently for 2 months The pt also c/o SOB- started two nights ago, dry cough that patient states she has to sit up to catch her breath with. She states she has right leg is swelling. She states she is out of lasix, has an appt 8/7 with cardiologist.

## 2021-11-29 NOTE — ED Provider Notes (Signed)
[place]   MRN: [id number] DOB: [birth year]  Subjective:   [name] [name] is a 71 y.o. female presenting for 61-month history of persistent urinary urgency and urinary frequency.  Patient has also had 2-day history of shortness of breath specifically when she is lying down.  Has been coughing up when she sits up from laying down and feels like it takes her some time to catch her breath.  She is not taking any Lasix.  Has not had a cardiology appointment since January.  She has documented chronic systolic heart failure with an LVEF of 35 to 40%.  She is supposed to get a repeat echocardiogram next month.  No overt chest pain or resting shortness of breath.  She does have a history of stroke and is on Xarelto.  She also has a history of left lower leg swelling which is happening now.  No swelling of the right lower leg.  No redness, warmth, calf pain.  No trauma, fall.  Patient admits that she does eat a lot of salt and is difficult for her to avoid.  She also drinks about 20 ounces of Diet Coke daily.  Hydrates very consistently.  No current facility-administered medications for this encounter.  Current Outpatient Medications:    acetaminophen (TYLENOL) 500 MG tablet, Take 1,000 mg by mouth daily as needed for mild pain., Disp: , Rfl:    ADULT TPN, Inject 1,800 mLs into the vein 4 (four) times a week. Pt receives home TPN from [facility]:  1800 mL bag, four nights weekly (Monday, Tuesday, Wednesday, Thursday for 8 hours (includes 1 hour taper up and down)., Disp: , Rfl:    Calcium Carb-Cholecalciferol (CALCIUM + D3 PO), Take 2 tablets by mouth daily., Disp: , Rfl:    empagliflozin (JARDIANCE) 10 MG TABS tablet, Take 1 tablet (10 mg total) by mouth daily before breakfast., Disp: 90 tablet, Rfl: 3   Heparin Sodium, Porcine, (HEPARIN LOCK FLUSH IJ), Inject 5 mLs as directed 4 (four) times a week. Monday, Tuesday, Wednesday, Thursday in the morning with TPN, Disp: , Rfl:     losartan (COZAAR) 25 MG tablet, Take 0.5 tablets (12.5 mg total) by mouth at bedtime., Disp: 90 tablet, Rfl: 3   Multiple Vitamins-Minerals (ADULT GUMMY PO), Take 2 tablets by mouth daily., Disp: , Rfl:    omeprazole (PRILOSEC) 40 MG capsule, TAKE 1 CAPSULE BY MOUTH EVERY DAY BEFORE BREAKFAST, Disp: 90 capsule, Rfl: 1   sodium chloride 0.9 % infusion, Inject 900 mLs into the vein every 14 (fourteen) days. Uses for TPN, Disp: , Rfl:    spironolactone (ALDACTONE) 25 MG tablet, TAKE 1 TABLET (25 MG TOTAL) BY MOUTH DAILY., Disp: 90 tablet, Rfl: 3   Teduglutide, rDNA, 5 MG KIT, Inject 3.31 Units into the skin every morning., Disp: 1 kit, Rfl: 0   triamcinolone cream (KENALOG) 0.1 %, Apply 1 application topically as needed (itching)., Disp: , Rfl:    vitamin E 1000 UNIT capsule, Take 1,000 Units by mouth daily., Disp: , Rfl:    XARELTO 20 MG TABS tablet, TAKE 1 TABLET BY MOUTH DAILY WITH SUPPER., Disp: 90 tablet, Rfl: 2   baclofen (LIORESAL) 10 MG tablet, Take 1 tablet (10 mg total) by mouth 2 (two) times daily as needed for muscle spasms., Disp: 60 each, Rfl: 4   clobetasol ointment (TEMOVATE) 9.84 %, Apply 1 application. topically 2 (two) times daily., Disp: , Rfl:    diphenhydrAMINE (BENADRYL) 25 mg capsule, Take 25-50  mg by mouth every 6 (six) hours as needed for itching or sleep. , Disp: , Rfl:    ergocalciferol (DRISDOL) 200 MCG/ML drops, Take 6 mLs (48,000 Units total) by mouth 3 (three) times a week., Disp: 720 mL, Rfl: 3   fluconazole (DIFLUCAN) 150 MG tablet, Take 1 tablet on day 3of antibiotics.  Take second tablet 3 days later., Disp: 2 tablet, Rfl: 0   fluticasone (FLONASE) 50 MCG/ACT nasal spray, Place 2 sprays into both nostrils daily as needed for allergies or rhinitis., Disp: , Rfl:    furosemide (LASIX) 20 MG tablet, TAKE ONE TABLET BY MOUTH AS NEEDED FOR SWELLING, Disp: 45 tablet, Rfl: 1   JARDIANCE 10 MG TABS tablet, Take 10 mg by mouth daily., Disp: , Rfl:    meclizine (ANTIVERT) 25  MG tablet, Take 1 tablet (25 mg total) by mouth 3 (three) times daily as needed for dizziness. (Patient not taking: Reported on 10/24/2021), Disp: 30 tablet, Rfl: 0   ondansetron (ZOFRAN-ODT) 8 MG disintegrating tablet, TAKE 1 TABLET BY MOUTH EVERY 8 HOURS AS NEEDED FOR NAUSEA OR VOMITING., Disp: 30 tablet, Rfl: 11   potassium chloride SA (KLOR-CON M) 20 MEQ tablet, Take 1 tablet (20 mEq total) by mouth 2 (two) times daily for 4 days., Disp: 8 tablet, Rfl: 0   No Known Allergies  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   Carnitine deficiency (HCC) 05/25/2018   Carotid stenosis    Carotid US (9/15):  R 40-59%; L 1-39% >> FU 1 year   Cataract    Closed left subtrochanteric femur fracture (HCC) 12/31/2017   Clotting disorder (HCC)    Congestive heart failure (CHF) (HCC)    EF 25-30%   COVID-19 2022   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   Fever 10/29/2020   Fracture of left clavicle    GERD (gastroesophageal reflux disease)    History of blood transfusion 2013   anemia   Hx of cardiovascular stress test    Myoview (9/15):  inf-apical scar; no ischemia; EF 47% - low risk    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 12/12/2011   Osteoporosis    Pancytopenia 10/07/2011   Pathologic fracture of neck of femur (HCC)    Personal history of colonic polyps    Renal insufficiency    hx of yrs ago   Serratia marcescens infection - bactermia assoc w/ PICC 01/18/2015   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HCC)    Splenomegaly    By ultrasound   Stroke (HCC) 07/2020   Thrombophilia (HCC)    Vitamin D deficiency      Past Surgical History:   Procedure Laterality Date   APPENDECTOMY  yrs ago   CHOLECYSTECTOMY  yrs ago   COLONOSCOPY  12/05/2005   internal hemorrhoids (for polyp surveillance)   COLONOSCOPY  04/26/2012   Procedure: COLONOSCOPY;  Surgeon: Iva Boop, MD;  Location: [facility];  Service: Endoscopy;  Laterality: N/A;   COLONOSCOPY WITH PROPOFOL N/A 03/18/2016   Procedure: COLONOSCOPY WITH PROPOFOL;  Surgeon: Iva Boop, MD;  Location: [facility];  Service: Endoscopy;  Laterality: N/A;   ESOPHAGOGASTRODUODENOSCOPY  01/22/2009   erosive esophagitis   HARDWARE REMOVAL Left 12/31/2017   Procedure: HARDWARE REMOVAL;  Surgeon: Rod Can, MD;  Location: [facility];  Service: Orthopedics;  Laterality: Left;   INTRAMEDULLARY (IM) NAIL INTERTROCHANTERIC Left 12/31/2017   Procedure: INTRAMEDULLARY (IM) NAIL SUBTROCHANTRIC;  Surgeon: Rod Can, MD;  Location: [facility];  Service: Orthopedics;  Laterality: Left;   IR CV LINE INJECTION  03/23/2018   IR CV LINE INJECTION  10/02/2021   IR FLUORO GUIDE CV LINE LEFT  01/01/2018   IR FLUORO GUIDE CV LINE LEFT  03/24/2018   IR FLUORO GUIDE CV LINE LEFT  05/21/2018   IR FLUORO GUIDE CV LINE LEFT  07/09/2018   IR FLUORO GUIDE CV LINE LEFT  12/28/2020   IR FLUORO GUIDE CV LINE LEFT  07/02/2021   IR FLUORO GUIDE CV LINE RIGHT  11/01/2020   IR GENERIC HISTORICAL  07/04/2016   IR REMOVAL TUN CV CATH W/O FL 07/04/2016 Ascencion Dike, PA-C [facility]   IR GENERIC HISTORICAL  07/10/2016   IR US GUIDE VASC ACCESS LEFT 07/10/2016 Arne Cleveland, MD [facility]   IR GENERIC HISTORICAL  07/10/2016   IR FLUORO GUIDE CV LINE LEFT 07/10/2016 Arne Cleveland, MD [facility]   IR PATIENT EVAL TECH 0-60 MINS  10/07/2017   IR PATIENT EVAL TECH 0-60 MINS  03/09/2018   IR RADIOLOGIST EVAL & MGMT  07/21/2017   IR RADIOLOGIST EVAL & MGMT  11/14/2020   IR RADIOLOGIST EVAL & MGMT  11/27/2020   IR RADIOLOGIST EVAL & MGMT  01/24/2021   IR REMOVAL TUN CV CATH W/O FL  12/28/2020   IR TRANSCATH PLC STENT   INITIAL VEIN  INC ANGIOPLASTY  12/28/2020   IR US GUIDE VASC ACCESS LEFT  11/01/2020   IR US GUIDE VASC ACCESS LEFT  12/28/2020   IR US GUIDE VASC ACCESS LEFT  12/28/2020   IR US GUIDE VASC ACCESS LEFT  12/28/2020   IR US GUIDE VASC ACCESS RIGHT  11/01/2020   IR VENO/EXT/UNI LEFT  11/01/2020   IR VENOCAVAGRAM SVC  12/28/2020   LEFT HEART CATH AND CORONARY ANGIOGRAPHY N/A 06/26/2020   Procedure: LEFT HEART CATH AND CORONARY ANGIOGRAPHY;  Surgeon: Jolaine Artist, MD;  Location: [facility];  Service: Cardiovascular;  Laterality: N/A;   ORIF PROXIMAL FEMORAL FRACTURE W/ ITST NAIL SYSTEM  03/2007   left, Dr. Shellia Carwin   SMALL INTESTINE SURGERY  2005   multiple with right colon resection for ischemia/infarct    Family History  Problem Relation Age of Onset   Diabetes Mother    Hypertension Mother    AAA (abdominal aortic aneurysm) Mother    Colon cancer Neg Hx    Stomach cancer Neg Hx     Social History   Tobacco Use   Smoking status: Never   Smokeless tobacco: Never  Vaping Use   Vaping Use: Never used  Substance Use Topics   Alcohol use: Never   Drug use: Never    ROS   Objective:   Vitals: BP 114/70 (BP Location: Right Arm)   Pulse 62   Temp 97.9 F (36.6 C) (Oral)   Resp 16   LMP 02/26/2014   SpO2 95%   Physical Exam Constitutional:      General: She is not in acute distress.    Appearance: Normal appearance. She is well-developed. She is not ill-appearing, toxic-appearing or diaphoretic.  HENT:  Head: Normocephalic and atraumatic.     Nose: Nose normal.     Mouth/Throat:     Mouth: Mucous membranes are moist.     Pharynx: Oropharynx is clear.  Eyes:     General: No scleral icterus.       Right eye: No discharge.        Left eye: No discharge.     Extraocular Movements: Extraocular movements intact.     Conjunctiva/sclera: Conjunctivae normal.  Cardiovascular:     Rate and Rhythm: Normal rate and regular rhythm.     Heart sounds: Normal heart  sounds. No murmur heard.    No friction rub. No gallop.  Pulmonary:     Effort: Pulmonary effort is normal. No respiratory distress.     Breath sounds: No stridor. No wheezing, rhonchi or rales.  Chest:     Chest wall: No tenderness.  Abdominal:     General: Bowel sounds are normal. There is no distension.     Palpations: Abdomen is soft. There is no mass.     Tenderness: There is no abdominal tenderness. There is no right CVA tenderness, left CVA tenderness, guarding or rebound.  Musculoskeletal:     Left lower leg: Edema (1+ distally up to mid lower leg) present.  Skin:    General: Skin is warm and dry.  Neurological:     General: No focal deficit present.     Mental Status: She is alert and oriented to person, place, and time.  Psychiatric:        Mood and Affect: Mood normal.        Behavior: Behavior normal.        Thought Content: Thought content normal.        Judgment: Judgment normal.    Results for orders placed or performed during the hospital encounter of 11/29/21 (from the past 24 hour(s))  POCT urinalysis dipstick     Status: Abnormal   Collection Time: 11/29/21  2:14 PM  Result Value Ref Range   Color, UA yellow yellow   Clarity, UA clear clear   Glucose, UA =100 (A) negative mg/dL   Bilirubin, UA negative negative   Ketones, POC UA negative negative mg/dL   Spec Grav, UA 1.015 1.010 - 1.025   Blood, UA trace-intact (A) negative   pH, UA 8.0 5.0 - 8.0   Protein Ur, POC negative negative mg/dL   Urobilinogen, UA 0.2 0.2 or 1.0 E.U./dL   Nitrite, UA Negative Negative   Leukocytes, UA Negative Negative   *Note: Due to a large number of results and/or encounters for the requested time period, some results have not been displayed. A complete set of results can be found in Results Review.   DG Chest 2 View  Result Date: 11/29/2021 CLINICAL DATA:  Shortness of breath. EXAM: CHEST - 2 VIEW COMPARISON:  06/29/2021 FINDINGS: There is a left IJ central venous  catheter with tip in the cavoatrial junction. Aortic atherosclerotic calcifications. Stable cardiac enlargement. Bilateral pleural effusions are identified right greater than left. Diffuse increase interstitial markings compatible with interstitial edema. No airspace consolidation. IMPRESSION: Findings compatible with moderate CHF. Electronically Signed   By: Kerby Moors M.D.   On: 11/29/2021 14:39     Assessment and Plan :   PDMP not reviewed this encounter.  1. Acute congestive heart failure, unspecified heart failure type ([place])   2. Urinary frequency   3. Shortness of breath   4. Stage 3 chronic kidney disease, unspecified whether  stage 3a or 3b CKD [facility])    Case discussed with cardiology NP [name].  Plan is to treat her with 80 mg daily of furosemide for today and tomorrow.  She is to switch to 40 mg daily thereafter.  She will see her urgently on Monday at 1 PM.  Emphasized need to limit her fluids to 1 L a day.  She is also to stop drinking Diet Coke.  Maintain all other medications. Counseled patient on potential for adverse effects with medications prescribed/recommended today, ER and return-to-clinic precautions discussed, patient verbalized understanding.    Jaynee Eagles, [place] 11/29/21 [phone]

## 2021-11-30 LAB — URINE CULTURE: Culture: NO GROWTH

## 2021-12-01 NOTE — Progress Notes (Signed)
ADVANCED HF CLINIC NOTE  Referring Physician: Dr. Johnsie Cancel Primary Care: None Primary Cardiologist: Dr. Johnsie Cancel  HPI: [name] [name] is a 71 y.o. 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 Johnsie Cancel for further evalaution of her progressive cardiomyopathy.    Echo 2015  EF 45% with inferior wall and apical HK - she did not want to have heart cath. Myoview done at that time showed inferior/ apical scar with no ischemia   Echo 12/03/16 EF 40-45% AV sclerosis mean gradient 6 mmHg  She saw Dr. Johnsie Cancel 10/21. Echo 11/21 EF 20-25% mild AS mean gradient 10. Referred here   She saw Dr Donzetta Matters [facility] for RUE swelling duplex with occlusive thrombus in right IJ chronic with small right subclavian antegrade flow. Deferred any venography intervention  Cath 2/22: EF 35-40% Mild CAD  (pLAD 30%, mLCX 30%, dRCA 30%)ejection fraction. EF 35-40% with inferoapical aneurysm suggestive of prior infarct. No RHC attempted due to known lack of venous access  Admitted 3/22 with acute CVA/TIA with left-sided weakness. MRI of the brain, numerous small scattered acute cortical and subcortical infarct within the right cerebral hemisphere, right MCA territory as well as ACA and PCA territory. CTA of the head and neck, no large vessel occlusion.  70% atheromatous stenosis at the right carotid bulb.  50% stenosis at the left carotid bulb.  Saw Dr Haroldine Laws in January. Started on jardiance.   Today she presents for an acute work in due to increased dyspnea with her husband.  She was seen at Urgent Care on 11/29/21 and felt to be volume overloaded. Urgent Care PA-C contacted HF team. She was instructed to take lasix 80 mg x3 days then 40 mg daily. Overall feeling much better. Denies SOB/PND/Orthopnea. Appetite ok. She continues on TPN  Sun- thus. On Sat/Sun she has hydration day.  She has been eating whatever she wants but has started watching her sodium intake.  No fever or chills. She has not been weighing at home. Taking all medications.  Echo 07/24/20 EF 35-40% with apical HK  no bubble done.  cMRI 4/22  EF 30% Akinesis of the mid-apical inferior wall, apical septum and aneurysm of the LV apex. LGE findings suggest prior infarct with scar with no viability in the mid inferior wall and LV apex. 2.  RV normal EF 49% 3.  No LV apical thrombus.  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   Carnitine deficiency ([place]) 05/25/2018   Carotid stenosis    Carotid US (9/15):  R 40-59%; L 1-39% >> FU 1 year   Cataract    Closed left subtrochanteric femur fracture ([place]) 12/31/2017   Clotting disorder ([place])    Congestive heart failure (CHF) ([place])    EF 25-30%   COVID-19 2022   Deep venous thrombosis ([place]) left subclavian vein 07/31/2017   Fever 10/29/2020   Fracture of left clavicle  GERD (gastroesophageal reflux disease)    History of blood transfusion 2013   anemia   Hx of cardiovascular stress test    Myoview (9/15):  inf-apical scar; no ischemia; EF 47% - low risk    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 12/12/2011   Osteoporosis    Pancytopenia 10/07/2011   Pathologic fracture of neck of femur (HCC)    Personal history of colonic polyps    Renal insufficiency    hx of yrs ago   Serratia marcescens infection - bactermia assoc w/ PICC 01/18/2015   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HCC)    Splenomegaly    By ultrasound   Stroke ([place])  07/2020   Thrombophilia (HCC)    Vitamin D deficiency     Current Outpatient Medications  Medication Sig Dispense Refill   acetaminophen (TYLENOL) 500 MG tablet Take 1,000 mg by mouth daily as needed for mild pain.     ADULT TPN Inject 1,800 mLs into the vein 4 (four) times a week. Pt receives home TPN from [facility]:  1800 mL bag, four nights weekly (Monday, Tuesday, Wednesday, Thursday for 8 hours (includes 1 hour taper up and down).     baclofen (LIORESAL) 10 MG tablet Take 1 tablet (10 mg total) by mouth 2 (two) times daily as needed for muscle spasms. 60 each 4   Calcium Carb-Cholecalciferol (CALCIUM + D3 PO) Take 2 tablets by mouth daily.     clobetasol ointment (TEMOVATE) 4.56 % Apply 1 application. topically 2 (two) times daily.     diphenhydrAMINE (BENADRYL) 25 mg capsule Take 25-50 mg by mouth every 6 (six) hours as needed for itching or sleep.      empagliflozin (JARDIANCE) 10 MG TABS tablet Take 1 tablet (10 mg total) by mouth daily before breakfast. 90 tablet 3   ergocalciferol (DRISDOL) 200 MCG/ML drops Take 6 mLs (48,000 Units total) by mouth 3 (three) times a week. 720 mL 3   fluticasone (FLONASE) 50 MCG/ACT nasal spray Place 2 sprays into both nostrils daily as needed for allergies or rhinitis.     furosemide (LASIX) 40 MG tablet Day 1-2: Take 62m daily. Thereafter take 1 tablet daily. 30 tablet 0   Heparin Sodium, Porcine, (HEPARIN LOCK FLUSH IJ) Inject 5 mLs as directed 4 (four) times a week. Monday, Tuesday, Wednesday, Thursday in the morning with TPN     losartan (COZAAR) 25 MG tablet Take 0.5 tablets (12.5 mg total) by mouth at bedtime. 90 tablet 3   meclizine (ANTIVERT) 25 MG tablet Take 1 tablet (25 mg total) by mouth 3 (three) times daily as needed for dizziness. 30 tablet 0   Multiple Vitamins-Minerals (ADULT GUMMY PO) Take 2 tablets by mouth daily.     omeprazole (PRILOSEC) 40 MG capsule TAKE 1 CAPSULE BY MOUTH EVERY DAY BEFORE BREAKFAST 90 capsule 1   ondansetron  (ZOFRAN-ODT) 8 MG disintegrating tablet TAKE 1 TABLET BY MOUTH EVERY 8 HOURS AS NEEDED FOR NAUSEA OR VOMITING. 30 tablet 11   sodium chloride 0.9 % infusion Inject 900 mLs into the vein every 14 (fourteen) days. Uses for TPN     spironolactone (ALDACTONE) 25 MG tablet TAKE 1 TABLET (25 MG TOTAL) BY MOUTH DAILY. 90 tablet 3   Teduglutide, rDNA, 5 MG KIT Inject 3.31 Units into the skin every morning. 1 kit 0   triamcinolone cream (KENALOG) 0.1 % Apply 1 application topically as needed (itching).     vitamin  E 1000 UNIT capsule Take 1,000 Units by mouth daily.     XARELTO 20 MG TABS tablet TAKE 1 TABLET BY MOUTH DAILY WITH SUPPER. 90 tablet 2   No current facility-administered medications for this encounter.    No Known Allergies     Social History   Socioeconomic History   Marital status: Married    Spouse name: Not on file   Number of children: 1   Years of education: Not on file   Highest education level: Not on file  Occupational History   Not on file  Tobacco Use   Smoking status: Never   Smokeless tobacco: Never  Vaping Use   Vaping Use: Never used  Substance and Sexual Activity   Alcohol use: Never   Drug use: Never   Sexual activity: Yes    Comment: 1st intercourse 78 yo-5 partners  Other Topics Concern   Not on file  Social History Narrative   ** Merged History Encounter **       Married to Herbie [place], has 1 daughter and a granddaughter the patient's son died in childhood due to a motor vehicle wreck 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 No alcohol tobacco or drug use 04/07/2017    Social Determinants of Health   Financial Resource Strain: Medium Risk (12/30/2017)   Overall Financial Resource Strain (CARDIA)    Difficulty of Paying Living Expenses: Somewhat hard  Food Insecurity: No Food Insecurity (12/30/2017)   Hunger Vital Sign    Worried About Running Out of Food in the Last Year: Never true    Ran Out of Food in the  Last Year: Never true  Transportation Needs: No Transportation Needs (04/13/2020)   PRAPARE - Hydrologist (Medical): No    Lack of Transportation (Non-Medical): No  Physical Activity: Insufficiently Active (12/30/2017)   Exercise Vital Sign    Days of Exercise per Week: 7 days    Minutes of Exercise per Session: 20 min  Stress: No Stress Concern Present (12/30/2017)   [place]    Feeling of Stress : Not at all  Social Connections: Moderately Integrated (12/30/2017)   Social Connection and Isolation Panel [NHANES]    Frequency of Communication with Friends and Family: More than three times a week    Frequency of Social Gatherings with Friends and Family: Three times a week    Attends Religious Services: 1 to 4 times per year    Active Member of Clubs or Organizations: No    Attends Archivist Meetings: Never    Marital Status: Married  Human resources officer Violence: Not on file      Family History  Problem Relation Age of Onset   Diabetes Mother    Hypertension Mother    AAA (abdominal aortic aneurysm) Mother    Colon cancer Neg Hx    Stomach cancer Neg Hx     Vitals:   12/02/21 1255  BP: (!) 102/58  Pulse: 61  SpO2: 98%  Weight: 64.1 kg (141 lb 4 oz)    Wt Readings from Last 3 Encounters:  12/02/21 64.1 kg (141 lb 4 oz)  10/24/21 66 kg (145 lb 8 oz)  10/10/21 66.9 kg (147 lb 6.4 oz)     PHYSICAL EXAM: General:  Well appearing. No resp difficulty. Walked in the clinic.  HEENT: normal Neck: supple. JVP  8-9 . Carotids 2+ bilat; no bruits. No  lymphadenopathy or thryomegaly appreciated. Cor: PMI nondisplaced. Regular rate & rhythm. No rubs, gallops or murmurs. Lungs: clear Abdomen: soft, nontender, nondistended. No hepatosplenomegaly. No bruits or masses. Good bowel sounds. Extremities: no cyanosis, clubbing, rash, edema Neuro: alert & orientedx3, cranial nerves  grossly intact. moves all 4 extremities w/o difficulty. Affect pleasant  EKG: R 63 bpm 1st degree heart block 236 ms   ASSESSMENT & PLAN:  1. Systolic HF due to Ischemic cardiomyopathy - EF 45% echo 2015 RWMA inferior wall and apex she did not want to have heart cath.  - Myoview 2015  EF 47% inferior/ apical scar with no ischemia - Echo 12/03/16 EF 40-45% AV sclerosis mean gradient 6 mmHg - Echo 11/21 EF 20-25% mild AS mean gradient 10  - Cath 06/26/20: EF 35-40% Mild CAD  (pLAD 30%, mLCX 30%, dRCA 30%)ejection fraction. EF 35-40% with inferoapical aneurysm suggestive of prior infarct (depsite lack of significant CAD). No RHC attempted due to known lack of venous access - Echo 07/24/20 EF 35-40% with apical HK  no bubble done. Mild AS Personally reviewed - cMRI 4/22 EF 30% Akinesis of the mid-apical inferior wall, apical septum and aneurysm of the LV apex. LGE findings suggest prior infarct with scar with no viability in the mid inferior wall and LV apex. RV normal  - NYHA II. Volume status improved after taking 80 mg po lasix x 3 days. Reds Clip 41%. On exam she does not appear volume overloaded. Would continue po lasix 40 mg x 3 days then back to as needed.  - Instructed to weigh and record daily.   - Failed Entresto due to low BP. Continue Losartan 12.37m qhs - Not on b-blocker due to bradycardia - Continue spiro 12.5  - Continue Jardiance 10 mg daily.  - Check BMET  - She has large infero-apical aneurysm in the absence of significant epicardial CAD. MRI confirms previous infarct. Possible thrombotic episode. Doubt she has LBBB cardiomyopathy as imaging doesn't match this  Likely not candidate for CRT-D due to diffuse venous occlusions and also high-risk of infection with ongoing TPN. - Check ECHO next month with Dr BHaroldine Laws  2. CAD, non-obstructive - probable previous thrombotic MI in past - No s/s chest pain.  - No ASA with Xarelto - Eventually needs statin but I worry nutritional  status currently too compromised for this. Follows with Dr. NJohnsie Cancel- Start Jardiance  3. Hypercoaguable state - chronically occluded RIJ - h/o mesenteric infarct  - on Xarelto. No bleeding   4. 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 Has limited venous access - Per Dr GCarlean Purl  5. LBBB (1546m - as above. Doubt she qualifies for CRT  6. Aortic stenosis - mild by echo mean gradient 15 - Will follow with routine echos  7. H/o CVA - follows with Dr. SeLeonie Man- consider statin   8. Hyperkalemia  Recent K 5.6 on 11/18/21  TPN was adjusted.  Check BMET    F/U with Dr BeHaroldine Lawsext month with an ECHO.   AmDarrick GrinderNP  1:10 PM

## 2021-12-02 ENCOUNTER — Ambulatory Visit (HOSPITAL_COMMUNITY)
Admit: 2021-12-02 | Discharge: 2021-12-02 | Disposition: A | Discharge status: Home / Self Care | Payer: Medicare Other | Attending: Adult Health | Admitting: Adult Health

## 2021-12-02 VITALS — BP 102/58 | HR 61 | Wt 141.2 lb

## 2021-12-02 DIAGNOSIS — Z79899 Other long term (current) drug therapy: Secondary | ICD-10-CM | POA: Insufficient documentation

## 2021-12-02 DIAGNOSIS — Z87891 Personal history of nicotine dependence: Secondary | ICD-10-CM | POA: Insufficient documentation

## 2021-12-02 DIAGNOSIS — Z7984 Long term (current) use of oral hypoglycemic drugs: Secondary | ICD-10-CM | POA: Insufficient documentation

## 2021-12-02 DIAGNOSIS — E875 Hyperkalemia: Secondary | ICD-10-CM

## 2021-12-02 DIAGNOSIS — Z7901 Long term (current) use of anticoagulants: Secondary | ICD-10-CM | POA: Diagnosis not present

## 2021-12-02 DIAGNOSIS — I440 Atrioventricular block, first degree: Secondary | ICD-10-CM | POA: Insufficient documentation

## 2021-12-02 DIAGNOSIS — I255 Ischemic cardiomyopathy: Secondary | ICD-10-CM | POA: Diagnosis not present

## 2021-12-02 DIAGNOSIS — Z5986 Financial insecurity: Secondary | ICD-10-CM | POA: Diagnosis not present

## 2021-12-02 DIAGNOSIS — I5022 Chronic systolic (congestive) heart failure: Secondary | ICD-10-CM | POA: Diagnosis not present

## 2021-12-02 DIAGNOSIS — K912 Postsurgical malabsorption, not elsewhere classified: Secondary | ICD-10-CM | POA: Diagnosis not present

## 2021-12-02 DIAGNOSIS — I447 Left bundle-branch block, unspecified: Secondary | ICD-10-CM

## 2021-12-02 DIAGNOSIS — K55029 Acute infarction of small intestine, extent unspecified: Secondary | ICD-10-CM | POA: Diagnosis not present

## 2021-12-02 DIAGNOSIS — Z8673 Personal history of transient ischemic attack (TIA), and cerebral infarction without residual deficits: Secondary | ICD-10-CM | POA: Diagnosis not present

## 2021-12-02 DIAGNOSIS — I5020 Unspecified systolic (congestive) heart failure: Secondary | ICD-10-CM | POA: Insufficient documentation

## 2021-12-02 DIAGNOSIS — I2510 Atherosclerotic heart disease of native coronary artery without angina pectoris: Secondary | ICD-10-CM

## 2021-12-02 DIAGNOSIS — D6869 Other thrombophilia: Secondary | ICD-10-CM | POA: Diagnosis not present

## 2021-12-02 DIAGNOSIS — I350 Nonrheumatic aortic (valve) stenosis: Secondary | ICD-10-CM | POA: Diagnosis not present

## 2021-12-02 LAB — BASIC METABOLIC PANEL
Anion gap: 9 (ref 5–15)
BUN: 25 mg/dL — ABNORMAL HIGH (ref 8–23)
CO2: 24 mmol/L (ref 22–32)
Calcium: 8.4 mg/dL — ABNORMAL LOW (ref 8.9–10.3)
Chloride: 102 mmol/L (ref 98–111)
Creatinine, Ser: 1.38 mg/dL — ABNORMAL HIGH (ref 0.44–1.00)
GFR, Estimated: 41 mL/min — ABNORMAL LOW (ref >60)
Glucose, Bld: 105 mg/dL — ABNORMAL HIGH (ref 70–99)
Potassium: 4.5 mmol/L (ref 3.5–5.1)
Sodium: 135 mmol/L (ref 135–145)

## 2021-12-02 LAB — BRAIN NATRIURETIC PEPTIDE: B Natriuretic Peptide: 2155.2 pg/mL — ABNORMAL HIGH (ref 0.0–100.0)

## 2021-12-02 MED ORDER — FUROSEMIDE 40 MG PO TABS: 40.0000 mg | ORAL_TABLET | Freq: Every day | ORAL | 6 refills | Status: DC | PRN

## 2021-12-02 NOTE — Progress Notes (Signed)
ReDS Vest / Clip - 12/02/21 1300       ReDS Vest / Clip   Station Marker C    Ruler Value 30    ReDS Value Range High volume overload    ReDS Actual Value 43

## 2021-12-02 NOTE — Patient Instructions (Addendum)
Take Furosemide 40 mg AS NEEDED  Labs done today, your results will be available in MyChart, we will contact you for abnormal readings.  Your physician recommends that you schedule a follow-up appointment in: AS SCHEDULED 12/16/21 for echocardiogram and Dr Haroldine Laws  Do the following things EVERYDAY: Weigh yourself in the morning before breakfast. Write it down and keep it in a log. Take your medicines as prescribed Eat low salt foods--Limit salt (sodium) to 2000 mg per day.  Stay as active as you can everyday Limit all fluids for the day to less than 2 liters  If you have any questions or concerns before your next appointment please send [place] a message through [place] or call our office at [phone].    TO LEAVE A MESSAGE FOR THE NURSE SELECT OPTION 2, PLEASE LEAVE A MESSAGE INCLUDING: YOUR NAME DATE OF BIRTH CALL BACK NUMBER REASON FOR CALL**this is important as we prioritize the call backs  YOU WILL RECEIVE A CALL BACK THE SAME DAY AS LONG AS YOU CALL BEFORE 4:00 PM  At the [facility], you and your health needs are our priority. As part of our continuing mission to provide you with exceptional heart care, we have created designated Provider Care Teams. These Care Teams include your primary Cardiologist (physician) and Advanced Practice Providers (APPs- Physician Assistants and Nurse Practitioners) who all work together to provide you with the care you need, when you need it.   You may see any of the following providers on your designated Care Team at your next follow up: Dr Glori Bickers Dr Haynes Kerns, NP Lyda Jester, [place] [facility] [place], [place] Audry Riles, PharmD   Please be sure to bring in all your medications bottles to every appointment.

## 2021-12-03 NOTE — Telephone Encounter (Signed)
[name] [name] from [facility] is questioning is there anyway that pt could have labs drawn that we are requesting be done  at the Oncologist office where she has labs to be drawn drawn every two weeks. [name] [name] is just wondering if that is something that could be done and if so what would be the process and how to possible go about it if possible: Please advise

## 2021-12-03 NOTE — Telephone Encounter (Signed)
Inbound call from [facility] with infusions requesting a call back at [phone] in regards to labs for patient. Please give a call to advise.  Thank you

## 2021-12-03 NOTE — Telephone Encounter (Signed)
I think we could work that out.  Ask her to specify what she wants and how often juts so we are certain

## 2021-12-04 NOTE — Telephone Encounter (Signed)
[name] [name] states that she already has a CBC and CMP drawn every two weeks at the Hematology office and is requesting that the following labs be added on:  a magnesium and phosphorus be drawn every two weeks  Triglycerides and Prealbumin be drawn once a month:  [name] [name] is also requesting for a 1 time order for a  Vitamin D to be drawn:  Please advise

## 2021-12-04 NOTE — Telephone Encounter (Signed)
Ria Comment,  Could you help [place] set these additional labs up for [place] so we can simplify things ?  Dx is acquired short bowel syndrome K91.2  Thanks,  Glendell Docker

## 2021-12-05 ENCOUNTER — Inpatient Hospital Stay: Payer: Medicare Other

## 2021-12-05 ENCOUNTER — Other Ambulatory Visit: Payer: Self-pay | Admitting: Adult Health

## 2021-12-05 ENCOUNTER — Other Ambulatory Visit: Payer: Self-pay

## 2021-12-05 VITALS — BP 115/65 | HR 67 | Temp 98.7°F | Resp 16

## 2021-12-05 DIAGNOSIS — D638 Anemia in other chronic diseases classified elsewhere: Secondary | ICD-10-CM

## 2021-12-05 DIAGNOSIS — K912 Postsurgical malabsorption, not elsewhere classified: Secondary | ICD-10-CM

## 2021-12-05 DIAGNOSIS — N1831 Chronic kidney disease, stage 3a: Secondary | ICD-10-CM | POA: Diagnosis not present

## 2021-12-05 DIAGNOSIS — D539 Nutritional anemia, unspecified: Secondary | ICD-10-CM

## 2021-12-05 DIAGNOSIS — D631 Anemia in chronic kidney disease: Secondary | ICD-10-CM | POA: Diagnosis not present

## 2021-12-05 DIAGNOSIS — D696 Thrombocytopenia, unspecified: Secondary | ICD-10-CM | POA: Diagnosis not present

## 2021-12-05 DIAGNOSIS — E611 Iron deficiency: Secondary | ICD-10-CM | POA: Diagnosis not present

## 2021-12-05 LAB — VITAMIN D 25 HYDROXY (VIT D DEFICIENCY, FRACTURES): Vit D, 25-Hydroxy: 13.99 ng/mL — ABNORMAL LOW (ref 30–100)

## 2021-12-05 LAB — CBC WITH DIFFERENTIAL (CANCER CENTER ONLY)
Abs Immature Granulocytes: 0.01 10*3/uL (ref 0.00–0.07)
Basophils Absolute: 0.1 10*3/uL (ref 0.0–0.1)
Basophils Relative: 1 %
Eosinophils Absolute: 1 10*3/uL — ABNORMAL HIGH (ref 0.0–0.5)
Eosinophils Relative: 15 %
HCT: 32.2 % — ABNORMAL LOW (ref 36.0–46.0)
Hemoglobin: 10.2 g/dL — ABNORMAL LOW (ref 12.0–15.0)
Immature Granulocytes: 0 %
Lymphocytes Relative: 30 %
Lymphs Abs: 1.9 10*3/uL (ref 0.7–4.0)
MCH: 29.5 pg (ref 26.0–34.0)
MCHC: 31.7 g/dL (ref 30.0–36.0)
MCV: 93.1 fL (ref 80.0–100.0)
Monocytes Absolute: 0.6 10*3/uL (ref 0.1–1.0)
Monocytes Relative: 10 %
Neutro Abs: 2.8 10*3/uL (ref 1.7–7.7)
Neutrophils Relative %: 44 %
Platelet Count: 99 10*3/uL — ABNORMAL LOW (ref 150–400)
RBC: 3.46 MIL/uL — ABNORMAL LOW (ref 3.87–5.11)
RDW: 16.2 % — ABNORMAL HIGH (ref 11.5–15.5)
WBC Count: 6.3 10*3/uL (ref 4.0–10.5)
nRBC: 0 % (ref 0.0–0.2)

## 2021-12-05 LAB — CMP (CANCER CENTER ONLY)
ALT: 19 U/L (ref 0–44)
AST: 26 U/L (ref 15–41)
Albumin: 3.3 g/dL — ABNORMAL LOW (ref 3.5–5.0)
Alkaline Phosphatase: 168 U/L — ABNORMAL HIGH (ref 38–126)
Anion gap: 5 (ref 5–15)
BUN: 31 mg/dL — ABNORMAL HIGH (ref 8–23)
CO2: 28 mmol/L (ref 22–32)
Calcium: 8.8 mg/dL — ABNORMAL LOW (ref 8.9–10.3)
Chloride: 102 mmol/L (ref 98–111)
Creatinine: 1.21 mg/dL — ABNORMAL HIGH (ref 0.44–1.00)
GFR, Estimated: 48 mL/min — ABNORMAL LOW (ref >60)
Glucose, Bld: 96 mg/dL (ref 70–99)
Potassium: 4.8 mmol/L (ref 3.5–5.1)
Sodium: 135 mmol/L (ref 135–145)
Total Bilirubin: 0.5 mg/dL (ref 0.3–1.2)
Total Protein: 8.4 g/dL — ABNORMAL HIGH (ref 6.5–8.1)

## 2021-12-05 LAB — TRIGLYCERIDES: Triglycerides: 99 mg/dL (ref <150)

## 2021-12-05 LAB — PHOSPHORUS: Phosphorus: 3.7 mg/dL (ref 2.5–4.6)

## 2021-12-05 LAB — MAGNESIUM: Magnesium: 2.1 mg/dL (ref 1.7–2.4)

## 2021-12-05 LAB — PREALBUMIN: Prealbumin: 16 mg/dL — ABNORMAL LOW (ref 18–38)

## 2021-12-05 MED ORDER — DARBEPOETIN ALFA 300 MCG/0.6ML IJ SOSY
300.0000 ug | PREFILLED_SYRINGE | Freq: Once | INTRAMUSCULAR | Status: AC
  Administered 2021-12-05: 300 ug via SUBCUTANEOUS
  Filled 2021-12-05: qty 0.6

## 2021-12-05 NOTE — Telephone Encounter (Signed)
Happy to.  The orders are in and her lab appt is today at 2:45 :)

## 2021-12-09 ENCOUNTER — Emergency Department (HOSPITAL_BASED_OUTPATIENT_CLINIC_OR_DEPARTMENT_OTHER): Payer: Medicare Other

## 2021-12-09 ENCOUNTER — Encounter: Payer: Self-pay | Admitting: Internal Medicine

## 2021-12-09 ENCOUNTER — Other Ambulatory Visit: Payer: Self-pay

## 2021-12-09 ENCOUNTER — Encounter (HOSPITAL_BASED_OUTPATIENT_CLINIC_OR_DEPARTMENT_OTHER): Payer: Self-pay | Admitting: Emergency Medicine

## 2021-12-09 ENCOUNTER — Encounter: Payer: Self-pay | Admitting: Family Medicine

## 2021-12-09 ENCOUNTER — Emergency Department (HOSPITAL_BASED_OUTPATIENT_CLINIC_OR_DEPARTMENT_OTHER)
Admission: EM | Admit: 2021-12-09 | Discharge: 2021-12-09 | Disposition: A | Discharge status: Home / Self Care | Payer: Medicare Other | Attending: Emergency Medicine | Admitting: Emergency Medicine

## 2021-12-09 DIAGNOSIS — R1110 Vomiting, unspecified: Secondary | ICD-10-CM | POA: Diagnosis present

## 2021-12-09 DIAGNOSIS — I509 Heart failure, unspecified: Secondary | ICD-10-CM | POA: Insufficient documentation

## 2021-12-09 DIAGNOSIS — E860 Dehydration: Secondary | ICD-10-CM | POA: Insufficient documentation

## 2021-12-09 DIAGNOSIS — R911 Solitary pulmonary nodule: Secondary | ICD-10-CM | POA: Diagnosis not present

## 2021-12-09 DIAGNOSIS — M7989 Other specified soft tissue disorders: Secondary | ICD-10-CM | POA: Diagnosis not present

## 2021-12-09 DIAGNOSIS — Z7901 Long term (current) use of anticoagulants: Secondary | ICD-10-CM | POA: Insufficient documentation

## 2021-12-09 DIAGNOSIS — J90 Pleural effusion, not elsewhere classified: Secondary | ICD-10-CM | POA: Diagnosis not present

## 2021-12-09 DIAGNOSIS — R0602 Shortness of breath: Secondary | ICD-10-CM | POA: Diagnosis not present

## 2021-12-09 DIAGNOSIS — A419 Sepsis, unspecified organism: Secondary | ICD-10-CM | POA: Insufficient documentation

## 2021-12-09 DIAGNOSIS — J811 Chronic pulmonary edema: Secondary | ICD-10-CM | POA: Diagnosis not present

## 2021-12-09 LAB — BASIC METABOLIC PANEL
Anion gap: 8 (ref 5–15)
BUN: 32 mg/dL — ABNORMAL HIGH (ref 8–23)
CO2: 21 mmol/L — ABNORMAL LOW (ref 22–32)
Calcium: 8 mg/dL — ABNORMAL LOW (ref 8.9–10.3)
Chloride: 102 mmol/L (ref 98–111)
Creatinine, Ser: 1.67 mg/dL — ABNORMAL HIGH (ref 0.44–1.00)
GFR, Estimated: 33 mL/min — ABNORMAL LOW (ref >60)
Glucose, Bld: 84 mg/dL (ref 70–99)
Potassium: 3.7 mmol/L (ref 3.5–5.1)
Sodium: 131 mmol/L — ABNORMAL LOW (ref 135–145)

## 2021-12-09 LAB — HEPATIC FUNCTION PANEL
ALT: 27 U/L (ref 0–44)
AST: 38 U/L (ref 15–41)
Albumin: 2.5 g/dL — ABNORMAL LOW (ref 3.5–5.0)
Alkaline Phosphatase: 182 U/L — ABNORMAL HIGH (ref 38–126)
Bilirubin, Direct: 0.2 mg/dL (ref 0.0–0.2)
Indirect Bilirubin: 0.6 mg/dL (ref 0.3–0.9)
Total Bilirubin: 0.8 mg/dL (ref 0.3–1.2)
Total Protein: 7.5 g/dL (ref 6.5–8.1)

## 2021-12-09 LAB — LACTIC ACID, PLASMA: Lactic Acid, Venous: 1.1 mmol/L (ref 0.5–1.9)

## 2021-12-09 LAB — MAGNESIUM: Magnesium: 1.7 mg/dL (ref 1.7–2.4)

## 2021-12-09 LAB — D-DIMER, QUANTITATIVE: D-Dimer, Quant: 4.68 ug/mL-FEU — ABNORMAL HIGH (ref 0.00–0.50)

## 2021-12-09 LAB — BRAIN NATRIURETIC PEPTIDE: B Natriuretic Peptide: 3875.2 pg/mL — ABNORMAL HIGH (ref 0.0–100.0)

## 2021-12-09 LAB — TROPONIN I (HIGH SENSITIVITY)
Troponin I (High Sensitivity): 205 ng/L (ref <18)
Troponin I (High Sensitivity): 186 ng/L (ref <18)

## 2021-12-09 LAB — PROTIME-INR
INR: 1.6 — ABNORMAL HIGH (ref 0.8–1.2)
Prothrombin Time: 19 seconds — ABNORMAL HIGH (ref 11.4–15.2)

## 2021-12-09 LAB — APTT: aPTT: 39 seconds — ABNORMAL HIGH (ref 24–36)

## 2021-12-09 MED ORDER — ONDANSETRON HCL 4 MG/2ML IJ SOLN
4.0000 mg | Freq: Once | INTRAMUSCULAR | Status: AC
Start: 2021-12-09 — End: 2021-12-09
  Administered 2021-12-09: 4 mg via INTRAVENOUS
  Filled 2021-12-09: qty 2

## 2021-12-09 MED ORDER — SODIUM CHLORIDE 0.9 % IV BOLUS
500.0000 mL | Freq: Once | INTRAVENOUS | Status: AC
  Administered 2021-12-09: 500 mL via INTRAVENOUS

## 2021-12-09 MED ORDER — ACETAMINOPHEN 325 MG PO TABS
650.0000 mg | ORAL_TABLET | Freq: Once | ORAL | Status: AC
  Administered 2021-12-09: 650 mg via ORAL
  Filled 2021-12-09: qty 2

## 2021-12-09 MED ORDER — VANCOMYCIN HCL 1500 MG/300ML IV SOLN
1500.0000 mg | Freq: Once | INTRAVENOUS | Status: DC
  Filled 2021-12-09: qty 300

## 2021-12-09 MED ORDER — CEFEPIME HCL 2 G IV SOLR
2.0000 g | Freq: Once | INTRAVENOUS | Status: AC
  Administered 2021-12-09: 2 g via INTRAVENOUS
  Filled 2021-12-09: qty 12.5

## 2021-12-09 MED ORDER — LACTATED RINGERS IV SOLN: INTRAVENOUS | Status: DC

## 2021-12-09 MED ORDER — DOXYCYCLINE HYCLATE 100 MG PO CAPS: 100.0000 mg | ORAL_CAPSULE | Freq: Two times a day (BID) | ORAL | 0 refills | Status: DC

## 2021-12-09 MED ORDER — SODIUM CHLORIDE 0.9 % IV BOLUS
500.0000 mL | Freq: Once | INTRAVENOUS | Status: AC
Start: 2021-12-09 — End: 2021-12-09
  Administered 2021-12-09: 500 mL via INTRAVENOUS

## 2021-12-09 MED ORDER — CEFEPIME HCL 2 G IV SOLR: 2.0000 g | INTRAVENOUS | Status: DC

## 2021-12-09 MED ORDER — LACTATED RINGERS IV BOLUS (SEPSIS)
500.0000 mL | Freq: Once | INTRAVENOUS | Status: AC
  Administered 2021-12-09: 500 mL via INTRAVENOUS

## 2021-12-09 MED ORDER — DIPHENHYDRAMINE HCL 50 MG/ML IJ SOLN
25.0000 mg | Freq: Once | INTRAMUSCULAR | Status: AC
  Administered 2021-12-09: 25 mg via INTRAVENOUS
  Filled 2021-12-09: qty 1

## 2021-12-09 MED ORDER — VANCOMYCIN HCL 750 MG/150ML IV SOLN
750.0000 mg | INTRAVENOUS | Status: DC
Start: 2021-12-10 — End: 2021-12-10
  Filled 2021-12-09: qty 150

## 2021-12-09 MED ORDER — METRONIDAZOLE 500 MG/100ML IV SOLN
500.0000 mg | Freq: Once | INTRAVENOUS | Status: AC
  Administered 2021-12-09: 500 mg via INTRAVENOUS
  Filled 2021-12-09: qty 100

## 2021-12-09 MED ORDER — IOHEXOL 350 MG/ML SOLN
60.0000 mL | Freq: Once | INTRAVENOUS | Status: AC | PRN
  Administered 2021-12-09: 60 mL via INTRAVENOUS

## 2021-12-09 MED ORDER — VANCOMYCIN HCL IN DEXTROSE 1-5 GM/200ML-% IV SOLN
1000.0000 mg | Freq: Once | INTRAVENOUS | Status: AC
  Administered 2021-12-09: 1000 mg via INTRAVENOUS
  Filled 2021-12-09: qty 200

## 2021-12-09 MED ORDER — VANCOMYCIN HCL 500 MG IV SOLR
500.0000 mg | Freq: Once | INTRAVENOUS | Status: AC
  Administered 2021-12-09: 500 mg via INTRAVENOUS

## 2021-12-09 NOTE — Progress Notes (Signed)
Pharmacy Antibiotic Note  [name] [name] is a 71 y.o. female admitted on 12/09/2021 presenting with SOB, concern for sepsis.  Pharmacy has been consulted for cefepime and vancomycin dosing.  Plan: Vancomycin 1500 mg IV x 1, then 750 mg IV q 24h (eAUC 536, SCr 1.67) Cefepime 2g IV every 24 hours Monitor renal function, Cx and clinical progression to narrow Vancomycin levels as indicated  Height: '5\' 8"'$  (172.7 cm) Weight: 64.2 kg (141 lb 9.6 oz) IBW/kg (Calculated) : 63.9  Temp (24hrs), Avg:98 F (36.7 C), Min:97.5 F (36.4 C), Max:98.2 F (36.8 C)  Recent Labs  Lab 12/05/21 1447 12/09/21 1526 12/09/21 1605  WBC 6.3 18.1*  --   CREATININE 1.21* 1.67*  --   LATICACIDVEN  --   --  1.1    Estimated Creatinine Clearance: 31.2 mL/min (A) (by C-G formula based on SCr of 1.67 mg/dL (H)).    Allergies  Allergen Reactions   Ivp Dye [Iodinated Contrast Media] Itching    Bertis Ruddy, PharmD Clinical Pharmacist ED Pharmacist Phone # [phone] 12/09/2021 6:19 PM

## 2021-12-09 NOTE — ED Notes (Signed)
Pt states that she understands risks of leaving AMA. Pt's questions were answered.

## 2021-12-09 NOTE — Sepsis Progress Note (Signed)
Sepsis protocol monitored by eLink 

## 2021-12-09 NOTE — ED Provider Notes (Signed)
[facility]PART[facility] Provider Note   CSN: [id number] Arrival date & time: 12/09/21  1445     History  Chief Complaint  Patient presents with   Dizziness   Shortness of Breath    [name] [name] is a 70 y.o. female.  Pt is a 71 yo female with a pmhx significant for CHF, short bowel syndrome (receiving TPN via central line), hx dvt, cva, and GERD.  Pt is on Xarelto.  Pt said she developed some chills last night.  No fevers.  She had vomiting all night.  She had some sob as well.  She feels dizzy when she stands up.  She had a hard time walking today.  She took antivert and zofran today without improvement in sx.  She went to UC on 7/21 for left leg swelling and was told to increase her lasix.  She still has left leg swelling.       Home Medications Prior to Admission medications   Medication Sig Start Date End Date Taking? Authorizing Provider  doxycycline (VIBRAMYCIN) 100 MG capsule Take 1 capsule (100 mg total) by mouth 2 (two) times daily. 12/09/21  Yes Isla Pence, MD  acetaminophen (TYLENOL) 500 MG tablet Take 1,000 mg by mouth daily as needed for mild pain.    [provider]  ADULT TPN Inject 1,800 mLs into the vein 4 (four) times a week. Pt receives home TPN from [facility]:  1800 mL bag, four nights weekly (Monday, Tuesday, Wednesday, Thursday for 8 hours (includes 1 hour taper up and down).    [provider]  baclofen (LIORESAL) 10 MG tablet Take 1 tablet (10 mg total) by mouth 2 (two) times daily as needed for muscle spasms. 08/29/20   Frann Rider, NP  Calcium Carb-Cholecalciferol (CALCIUM + D3 PO) Take 2 tablets by mouth daily.    [provider]  clobetasol ointment (TEMOVATE) 5.99 % Apply 1 application. topically 2 (two) times daily. 06/14/21   [provider]  diphenhydrAMINE (BENADRYL) 25 mg capsule Take 25-50 mg by mouth every 6 (six) hours as needed for itching or sleep.     [provider]   empagliflozin (JARDIANCE) 10 MG TABS tablet Take 1 tablet (10 mg total) by mouth daily before breakfast. 06/11/21   Bensimhon, Shaune Pascal, MD  ergocalciferol (DRISDOL) 200 MCG/ML drops Take 6 mLs (48,000 Units total) by mouth 3 (three) times a week. 08/21/21   Gatha Mayer, MD  fluticasone (FLONASE) 50 MCG/ACT nasal spray Place 2 sprays into both nostrils daily as needed for allergies or rhinitis.    [provider]  furosemide (LASIX) 40 MG tablet Take 1 tablet (40 mg total) by mouth daily as needed. 12/02/21   Clegg, [name] D, NP  Heparin Sodium, Porcine, (HEPARIN LOCK FLUSH IJ) Inject 5 mLs as directed 4 (four) times a week. Monday, Tuesday, Wednesday, Thursday in the morning with TPN    [provider]  losartan (COZAAR) 25 MG tablet Take 0.5 tablets (12.5 mg total) by mouth at bedtime. 12/18/20 06/11/22  Bensimhon, Shaune Pascal, MD  meclizine (ANTIVERT) 25 MG tablet Take 1 tablet (25 mg total) by mouth 3 (three) times daily as needed for dizziness. 04/29/21   Haydee Salter, MD  Multiple Vitamins-Minerals (ADULT GUMMY PO) Take 2 tablets by mouth daily.    [provider]  omeprazole (PRILOSEC) 40 MG capsule TAKE 1 CAPSULE BY MOUTH EVERY DAY BEFORE BREAKFAST 06/28/21   Gatha Mayer, MD  ondansetron (  ZOFRAN-ODT) 8 MG disintegrating tablet TAKE 1 TABLET BY MOUTH EVERY 8 HOURS AS NEEDED FOR NAUSEA OR VOMITING. 09/09/21   Gatha Mayer, MD  sodium chloride 0.9 % infusion Inject 900 mLs into the vein every 14 (fourteen) days. Uses for TPN 10/27/17   [provider]  spironolactone (ALDACTONE) 25 MG tablet TAKE 1 TABLET (25 MG TOTAL) BY MOUTH DAILY. 08/28/21   Bensimhon, Shaune Pascal, MD  Teduglutide, rDNA, 5 MG KIT Inject 3.31 Units into the skin every morning. 10/17/21   Gatha Mayer, MD  triamcinolone cream (KENALOG) 0.1 % Apply 1 application topically as needed (itching).    [provider]  vitamin E 1000 UNIT capsule Take 1,000 Units by mouth daily.    [provider]  XARELTO 20 MG TABS tablet TAKE 1 TABLET BY MOUTH DAILY WITH SUPPER. 08/30/21   [name], MD      Allergies    Ivp dye [iodinated contrast media]    Review of Systems   Review of Systems  Respiratory:  Positive for shortness of breath.   Neurological:  Positive for dizziness.  All other systems reviewed and are negative.   Physical Exam Updated Vital Signs BP (!) 92/46   Pulse (!) 58   Temp (!) 97.5 F (36.4 C) (Oral)   Resp 16   Ht _0  (1.727 m)   Wt 64.2 kg   LMP 02/26/2014   SpO2 100%   BMI 21.53 kg/m  Physical Exam Vitals and nursing note reviewed.  Constitutional:      Appearance: She is well-developed.  HENT:     Head: Normocephalic and atraumatic.     Mouth/Throat:     Mouth: Mucous membranes are dry.     Pharynx: Oropharynx is clear.  Eyes:     Extraocular Movements: Extraocular movements intact.     Pupils: Pupils are equal, round, and reactive to light.  Cardiovascular:     Rate and Rhythm: Normal rate and regular rhythm.  Pulmonary:     Effort: Pulmonary effort is normal.     Breath sounds: Normal breath sounds.  Chest:     Comments: Central line left upper chest.  No redness or swelling. Abdominal:     General: Bowel sounds are normal.     Palpations: Abdomen is soft.  Musculoskeletal:        General: Normal range of motion.     Cervical back: Normal range of motion and neck supple.     Left lower leg: Edema present.  Skin:    General: Skin is warm.     Capillary Refill: Capillary refill takes less than 2 seconds.  Neurological:     General: No focal deficit present.     Mental Status: She is alert and oriented to person, place, and time.  Psychiatric:        Mood and Affect: Mood normal.        Behavior: Behavior normal.     ED Results / Procedures / Treatments   Labs (all labs ordered are listed, but only abnormal results are displayed) Labs Reviewed  BASIC METABOLIC PANEL - Abnormal; Notable for the  following components:      Result Value   Sodium 131 (*)    CO2 21 (*)    BUN 32 (*)    Creatinine, Ser 1.67 (*)    Calcium 8.0 (*)    GFR, Estimated 33 (*)    All other components within normal limits  CBC -  Abnormal; Notable for the following components:   WBC 18.1 (*)    RBC 3.27 (*)    Hemoglobin 9.7 (*)    HCT 30.4 (*)    RDW 15.6 (*)    Platelets 65 (*)    All other components within normal limits  HEPATIC FUNCTION PANEL - Abnormal; Notable for the following components:   Albumin 2.5 (*)    Alkaline Phosphatase 182 (*)    All other components within normal limits  BRAIN NATRIURETIC PEPTIDE - Abnormal; Notable for the following components:   B Natriuretic Peptide 3,875.2 (*)    All other components within normal limits  D-DIMER, QUANTITATIVE - Abnormal; Notable for the following components:   D-Dimer, Quant 4.68 (*)    All other components within normal limits  PROTIME-INR - Abnormal; Notable for the following components:   Prothrombin Time 19.0 (*)    INR 1.6 (*)    All other components within normal limits  APTT - Abnormal; Notable for the following components:   aPTT 39 (*)    All other components within normal limits  TROPONIN I (HIGH SENSITIVITY) - Abnormal; Notable for the following components:   Troponin I (High Sensitivity) 205 (*)    All other components within normal limits  TROPONIN I (HIGH SENSITIVITY) - Abnormal; Notable for the following components:   Troponin I (High Sensitivity) 186 (*)    All other components within normal limits  CULTURE, BLOOD (ROUTINE X 2)  CULTURE, BLOOD (ROUTINE X 2)  RESP PANEL BY RT-PCR (FLU A&B, COVID) ARPGX2  CULTURE, BLOOD (ROUTINE X 2)  CULTURE, BLOOD (ROUTINE X 2)  URINE CULTURE  MAGNESIUM  LACTIC ACID, PLASMA  URINALYSIS, ROUTINE W REFLEX MICROSCOPIC    EKG EKG Interpretation  Date/Time:  Monday December 09 2021 14:59:04 EDT Ventricular Rate:  69 PR Interval:    QRS Duration: 118 QT Interval:  486 QTC  Calculation: 520 R Axis:   18 Text Interpretation: Undetermined rhythm Low voltage QRS Septal infarct , age undetermined ST & T wave abnormality, consider inferolateral ischemia Prolonged QT Abnormal ECG When compared with ECG of 02-Dec-2021 12:58, PREVIOUS ECG IS PRESENT No significant change since last tracing Confirmed by Isla Pence [phone]) on 12/09/2021 3:59:49 PM  Radiology CT Angio Chest PE W and/or Wo Contrast  Result Date: 12/09/2021 CLINICAL DATA:  High probability for PE. EXAM: CT ANGIOGRAPHY CHEST WITH CONTRAST TECHNIQUE: Multidetector CT imaging of the chest was performed using the standard protocol during bolus administration of intravenous contrast. Multiplanar CT image reconstructions and MIPs were obtained to evaluate the vascular anatomy. RADIATION DOSE REDUCTION: This exam was performed according to the departmental dose-optimization program which includes automated exposure control, adjustment of the mA and/or kV according to patient size and/or use of iterative reconstruction technique. CONTRAST:  64m OMNIPAQUE IOHEXOL 350 MG/ML SOLN COMPARISON:  Chest x-ray same day.  CT chest 05/15/2021. FINDINGS: Cardiovascular: Heart is enlarged, unchanged. There is no pericardial effusion. Aorta is normal in size. There are atherosclerotic calcifications of the aorta and coronary arteries. There is adequate opacification of the pulmonary arteries. There is no evidence for pulmonary embolism. Mediastinum/Nodes: There is an enlarged precarinal lymph node measuring 13 mm which is unchanged. There are prominent, but nonenlarged right hilar lymph nodes. Esophagus and visualized thyroid gland are within normal limits. Lungs/Pleura: There is some patchy ground-glass opacities throughout both lungs in a lower lobe predominance. There is some smooth interlobular septal thickening in the lung apices and there is some peripheral interstitial opacities  bilaterally. There are minimal patchy airspace opacities  in the bilateral lung bases/lower lobes. There is a stable right upper lobe nodule measuring 9 mm image 5/54. There are trace bilateral pleural effusions. There is no pneumothorax. Upper Abdomen: Cholecystectomy clips are present. Musculoskeletal: No chest wall abnormality. No acute or significant osseous findings. Review of the MIP images confirms the above findings. IMPRESSION: 1. No evidence for pulmonary embolism. 2. Ground-glass opacities and smooth interlobular septal thickening likely related to mild pulmonary edema. 3. Trace pleural effusions. 4. Minimal patchy airspace opacities in the lung bases may represent edema or infection. 5. Stable cardiomegaly. 6. Stable 9 mm right upper lobe nodule. Consider one of the following in 3 months for both low-risk and high-risk individuals: (a) repeat chest CT, (b) follow-up PET-CT, or (c) tissue sampling. This recommendation follows the consensus statement: Guidelines for Management of Incidental Pulmonary Nodules Detected on CT Images: From the Fleischner Society 2017; Radiology 2017; 284:228-243. 7. Stable precarinal lymphadenopathy. Electronically Signed   By: Ronney Asters M.D.   On: 12/09/2021 18:55   US Venous Img Lower Bilateral (DVT)  Result Date: 12/09/2021 CLINICAL DATA:  Shortness of breath.  CHF.  Leg swelling. EXAM: BILATERAL LOWER EXTREMITY VENOUS DOPPLER ULTRASOUND TECHNIQUE: Gray-scale sonography with graded compression, as well as color Doppler and duplex ultrasound were performed to evaluate the lower extremity deep venous systems from the level of the common femoral vein and including the common femoral, femoral, profunda femoral, popliteal and calf veins including the posterior tibial, peroneal and gastrocnemius veins when visible. The superficial great saphenous vein was also interrogated. Spectral Doppler was utilized to evaluate flow at rest and with distal augmentation maneuvers in the common femoral, femoral and popliteal veins. COMPARISON:   None Available. FINDINGS: RIGHT LOWER EXTREMITY Common Femoral Vein: No evidence of thrombus. Normal compressibility, respiratory phasicity and response to augmentation. Saphenofemoral Junction: No evidence of thrombus. Normal compressibility and flow on color Doppler imaging. Profunda Femoral Vein: No evidence of thrombus. Normal compressibility and flow on color Doppler imaging. Femoral Vein: No evidence of thrombus. Normal compressibility, respiratory phasicity and response to augmentation. Popliteal Vein: No evidence of thrombus. Normal compressibility, respiratory phasicity and response to augmentation. Calf Veins: No evidence of thrombus. Normal compressibility and flow on color Doppler imaging. LEFT LOWER EXTREMITY Common Femoral Vein: No evidence of thrombus. Normal compressibility, respiratory phasicity and response to augmentation. Saphenofemoral Junction: No evidence of thrombus. Normal compressibility and flow on color Doppler imaging. Profunda Femoral Vein: No evidence of thrombus. Normal compressibility and flow on color Doppler imaging. Femoral Vein: No evidence of thrombus. Normal compressibility, respiratory phasicity and response to augmentation. Popliteal Vein: No evidence of thrombus. Normal compressibility, respiratory phasicity and response to augmentation. Calf Veins: No evidence of thrombus. Normal compressibility and flow on color Doppler imaging. IMPRESSION: No evidence of deep venous thrombosis in either lower extremity. Electronically Signed   By: Margaretha Sheffield M.D.   On: 12/09/2021 16:12   DG Chest Portable 1 View  Result Date: 12/09/2021 CLINICAL DATA:  Shortness of breath. EXAM: PORTABLE CHEST 1 VIEW COMPARISON:  November 29, 2021. FINDINGS: The heart size and mediastinal contours are within normal limits. Left internal jugular PICC line is unchanged in position. Pulmonary edema noted on prior exam is significantly improved. The visualized skeletal structures are unremarkable.  IMPRESSION: Significantly improved pulmonary edema compared to prior exam. Electronically Signed   By: Marijo Conception M.D.   On: 12/09/2021 16:00    Procedures Procedures    Medications  Ordered in ED Medications  lactated ringers infusion ( Intravenous New Bag/Given 12/09/21 1720)  vancomycin (VANCOCIN) IVPB 1000 mg/200 mL premix (1,000 mg Intravenous New Bag/Given 12/09/21 2003)    Followed by  vancomycin (VANCOCIN) 500 mg in sodium chloride 0.9 % 100 mL IVPB (has no administration in time range)  ceFEPIme (MAXIPIME) 2 g in sodium chloride 0.9 % 100 mL IVPB (has no administration in time range)  vancomycin (VANCOREADY) IVPB 750 mg/150 mL (has no administration in time range)  sodium chloride 0.9 % bolus 500 mL (0 mLs Intravenous Stopped 12/09/21 1802)  ondansetron (ZOFRAN) injection 4 mg (4 mg Intravenous Given 12/09/21 1704)  diphenhydrAMINE (BENADRYL) injection 25 mg (25 mg Intravenous Given 12/09/21 1722)  lactated ringers bolus 500 mL (0 mLs Intravenous Stopped 12/09/21 1820)  metroNIDAZOLE (FLAGYL) IVPB 500 mg (0 mg Intravenous Stopped 12/09/21 1821)  ceFEPIme (MAXIPIME) 2 g in sodium chloride 0.9 % 100 mL IVPB (0 g Intravenous Stopped 12/09/21 1922)  acetaminophen (TYLENOL) tablet 650 mg (650 mg Oral Given 12/09/21 1751)  iohexol (OMNIPAQUE) 350 MG/ML injection 60 mL (60 mLs Intravenous Contrast Given 12/09/21 1826)  sodium chloride 0.9 % bolus 500 mL (500 mLs Intravenous New Bag/Given 12/09/21 1855)    ED Course/ Medical Decision Making/ A&P                           Medical Decision Making Amount and/or Complexity of Data Reviewed Labs: ordered. Radiology: ordered.  Risk OTC drugs. Prescription drug management. Decision regarding hospitalization.   This patient presents to the ED for concern of weakness, this involves an extensive number of treatment options, and is a complaint that carries with it a high risk of complications and morbidity.  The differential diagnosis includes  sepsis, infection, dehydration   Co morbidities that complicate the patient evaluation  CHF, short bowel syndrome (receiving TPN via central line), hx dvt, cva, and GERD   Additional history obtained:  Additional history obtained from epic chart review External records from outside source obtained and reviewed including family   Lab Tests:  I Ordered, and personally interpreted labs.  The pertinent results include:   CBC elevated at 18.1, hgb 9.7 (wbc 6.3, hgb 10.2 4 days ago); trop 205, but down to 186 on 2nd trop; ck 1.67 (4 days ago it was 1.21); ddimer + at 4.68   Imaging Studies ordered:  I ordered imaging studies including cxr, [place] leg, ct chest  I independently visualized and interpreted imaging which showed  CXR: IMPRESSION:  Significantly improved pulmonary edema compared to prior exam.  [place]: IMPRESSION:  No evidence of deep venous thrombosis in either lower extremity.  CT chest:  IMPRESSION: 1. No evidence for pulmonary embolism. 2. Ground-glass opacities and smooth interlobular septal thickening likely related to mild pulmonary edema. 3. Trace pleural effusions. 4. Minimal patchy airspace opacities in the lung bases may represent edema or infection. 5. Stable cardiomegaly. 6. Stable 9 mm right upper lobe nodule. Consider one of the following in 3 months for both low-risk and high-risk individuals: (a) repeat chest CT, (b) follow-up PET-CT, or (c) tissue sampling. This recommendation follows the consensus statement: Guidelines for Management of Incidental Pulmonary Nodules Detected on CT Images: From the Fleischner Society 2017; Radiology 2017; 284:228-243. 7. Stable precarinal lymphadenopathy.  I agree with the radiologist interpretation   Cardiac Monitoring:  The patient was maintained on a cardiac monitor.  I personally viewed and interpreted the cardiac monitored which showed an  underlying rhythm of: sinus bracy   Medicines ordered and prescription  drug management:  I ordered medication including maxipime, vanc, and flagyl  for sepsis  Reevaluation of the patient after these medicines showed that the patient improved I have reviewed the patients home medicines and have made adjustments as needed   Test Considered:  ct   Critical Interventions:  abx   Consultations Obtained:  I requested consultation with the hospitalist (Dr. Myna Hidalgo),  and discussed lab and imaging findings as well as pertinent plan - he asked that I speak to the critical care doctors.  I did talk to Dr. Ruthann Cancer (CCM), but pt had decided to leave AMA by then.   Problem List / ED Course:  Sepsis:  pt is persistently hypotensive.  Lactic is normal, so she is perfusing.  I have given her fluids gently due to her severe CHF.  I have held off on pressors as the lactic is normal and she feels better.  I did give pt vanc, flagyl, and maxipime for sepsis of unkn origin.  I did want to admit pt.  However, pt refuses admission.  She said her TPN is never right when she goes into the hospital and feels better.  I talked her and could not get her to stay.  She did promise to return if worse.  Family is in the room during the discussion when I told her she could die if she goes home.  As she does have some pna on cxr, I will d/c her with doxy.    Reevaluation:  After the interventions noted above, I reevaluated the patient and found that they have :improved   Social Determinants of Health:  Lives at home   Dispostion:  After consideration of the diagnostic results and the patients response to treatment, I feel that the patent would benefit from admission.    CRITICAL CARE Performed by: Isla Pence   Total critical care time: 30 minutes  Critical care time was exclusive of separately billable procedures and treating other patients.  Critical care was necessary to treat or prevent imminent or life-threatening deterioration.  Critical care was time spent  personally by me on the following activities: development of treatment plan with patient and/or surrogate as well as nursing, discussions with consultants, evaluation of patient's response to treatment, examination of patient, obtaining history from patient or surrogate, ordering and performing treatments and interventions, ordering and review of laboratory studies, ordering and review of radiographic studies, pulse oximetry and re-evaluation of patient's condition.         Final Clinical Impression(s) / ED Diagnoses Final diagnoses:  Sepsis, due to unspecified organism, unspecified whether acute organ dysfunction present [facility])  Dehydration    Rx / DC Orders ED Discharge Orders          Ordered    doxycycline (VIBRAMYCIN) 100 MG capsule  2 times daily        12/09/21 2016              Isla Pence, MD 12/09/21 2021

## 2021-12-09 NOTE — ED Notes (Signed)
MD made aware of BP

## 2021-12-09 NOTE — ED Triage Notes (Signed)
Pt POV c/o ShOB, orthopnea, emesis x1 day. Dizziness starting last night. Recently seen for CHF. Denies any new swelling.  Took zofran, antivert today, little relief. C/o feeling light headed during triage, gets dizzy when standing.  Pt is chronic TPN infusion pt.

## 2021-12-09 NOTE — ED Notes (Signed)
Pt escorted with monitor and this RN to CT. Pt tolerated scan with no issues.

## 2021-12-10 LAB — CBC
HCT: 30.4 % — ABNORMAL LOW (ref 36.0–46.0)
Hemoglobin: 9.7 g/dL — ABNORMAL LOW (ref 12.0–15.0)
MCH: 29.7 pg (ref 26.0–34.0)
MCHC: 31.9 g/dL (ref 30.0–36.0)
MCV: 93 fL (ref 80.0–100.0)
Platelets: 65 10*3/uL — ABNORMAL LOW (ref 150–400)
RBC: 3.27 MIL/uL — ABNORMAL LOW (ref 3.87–5.11)
RDW: 15.6 % — ABNORMAL HIGH (ref 11.5–15.5)
WBC: 18.1 10*3/uL — ABNORMAL HIGH (ref 4.0–10.5)
nRBC: 0 % (ref 0.0–0.2)

## 2021-12-10 NOTE — Telephone Encounter (Signed)
I called her and she is asymptomatic today.  No nausea or vomiting.  No dizziness or weakness reported.  Never had a fever.  So this does not sound like previous line infections though not ruled out.  Had elevated troponin D-dimer and BNP.  EKG was unchanged.  Her plan is to see how it goes.  She understands to return to the ER if she feels bad again.  She has follow-up with Dr. Haroldine Laws August 7 and an echocardiogram right before that.   Blood and urine cultures are pending. Doxycycline was prescribed.

## 2021-12-14 LAB — CULTURE, BLOOD (ROUTINE X 2)
Culture: NO GROWTH
Culture: NO GROWTH
Special Requests: ADEQUATE
Special Requests: ADEQUATE

## 2021-12-15 NOTE — H&P (View-Only) (Signed)
ADVANCED HF CLINIC NOTE  Referring Physician: Dr. Johnsie Cancel Primary Care: None Primary Cardiologist: Dr. Johnsie Cancel  HPI: [name] [name] is a 71 y.o. 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 Johnsie Cancel for further evalaution of her progressive cardiomyopathy.    Echo 2015  EF 45% with inferior wall and apical HK - she did not want to have heart cath. Myoview done at that time showed inferior/ apical scar with no ischemia   Echo 12/03/16 EF 40-45% AV sclerosis mean gradient 6 mmHg  She saw Dr. Johnsie Cancel 10/21. Echo 11/21 EF 20-25% mild AS mean gradient 10. Referred here   She saw Dr Donzetta Matters [facility] for RUE swelling duplex with occlusive thrombus in right IJ chronic with small right subclavian antegrade flow. Deferred any venography intervention  Cath 2/22: EF 35-40% Mild CAD  (pLAD 30%, mLCX 30%, dRCA 30%)ejection fraction. EF 35-40% with inferoapical aneurysm suggestive of prior infarct. No RHC attempted due to known lack of venous access  Admitted 3/22 with acute CVA/TIA with left-sided weakness. MRI of the brain, numerous small scattered acute cortical and subcortical infarct within the right cerebral hemisphere, right MCA territory as well as ACA and PCA territory. CTA of the head and neck, no large vessel occlusion.  70% atheromatous stenosis at the right carotid bulb.  50% stenosis at the left carotid bulb.  Echo 07/24/20 EF 35-40% with apical HK  no bubble done.  cMRI 4/22  EF 30% Akinesis of the mid-apical inferior wall, apical septum and aneurysm of the LV apex. LGE findings suggest prior infarct with scar with no viability in the mid inferior wall and LV apex. 2.  RV normal EF 49% 3.  No LV apical thrombus.  Echo 1/23 EF 35-40% Mild AS/mild to  moderate AI  She was seen at Urgent Care on  11/29/21 and felt to be volume overloaded. Urgent Care PA-C contacted HF team. She was instructed to take lasix 80 mg x3 days then 40 mg daily.   Here for routine f/u with echo. Getting TPN 5 nights per week (had venoplasty to get central line). Also drinks a whole lot so feels she is getting volume overloaded. Feels volume status is improved with lasix. Takes 21m some days and 824mabout 1-2x/week. Mild LLE edema. When fluid is controlled can go to store and do other activities. No CP, orthopnea or PND. Having dizziness and tinnitus. Has brain MRI on Firday.   Echo today 12/16/21: EF 35-40% RV ok. Severe MR. Mild to moderate AI/mild AS  Past Medical History:  Diagnosis Date   Abnormal LFTs 2013   Allergic rhinosinusitis    Allergy    Anemia of chronic disease    At risk for dental problems    Atypical nevus    Bacteremia 10/27/2020   Bacteremia due to Klebsiella pneumoniae 12/12/2011   Bacterial overgrowth syndrome    Brachial vein thrombus, left ([place]/30[place]2014   Carnitine deficiency ([facility] [place]/14/2020   Carotid stenosis    Carotid [place]  R 40-59%; L 1-39% >> FU 1 year   Cataract    Closed left subtrochanteric femur fracture ([facility]/2019   Clotting disorder ([facility]   Congestive heart [place] (CHF) ([facility] [place]   EF 25-30%   COVID-19 2022   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  07/31/2017   Fever 10/29/2020   Fracture of left clavicle    GERD (gastroesophageal reflux disease)    History of blood transfusion 2013   anemia   Hx of cardiovascular stress test    Myoview (9/15):  inf-apical scar; no ischemia; EF 47% - low risk    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 12/12/2011   Osteoporosis    Pancytopenia 10/07/2011   Pathologic fracture of neck of femur ([place])    Personal history of colonic polyps    Renal insufficiency    hx of yrs ago   Serratia marcescens infection - bactermia assoc w/ PICC 01/18/2015   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   Small bowel  ischemia (HCC)    Splenomegaly    By ultrasound   Stroke ([place]) 07/2020   Thrombophilia (HCC)    Vitamin D deficiency     Current Outpatient Medications  Medication Sig Dispense Refill   acetaminophen (TYLENOL) 500 MG tablet Take 1,000 mg by mouth daily as needed for mild pain.     ADULT TPN Inject 1,800 mLs into the vein 4 (four) times a week. Pt receives home TPN from [facility]:  1800 mL bag, four nights weekly (Monday, Tuesday, Wednesday, Thursday for 8 hours (includes 1 hour taper up and down).     baclofen (LIORESAL) 10 MG tablet Take 1 tablet (10 mg total) by mouth 2 (two) times daily as needed for muscle spasms. 60 each 4   Calcium Carb-Cholecalciferol (CALCIUM + D3 PO) Take 2 tablets by mouth daily.     clobetasol ointment (TEMOVATE) 4.09 % Apply 1 application. topically 2 (two) times daily.     diphenhydrAMINE (BENADRYL) 25 mg capsule Take 25-50 mg by mouth every 6 (six) hours as needed for itching or sleep.      doxycycline (VIBRAMYCIN) 100 MG capsule Take 1 capsule (100 mg total) by mouth 2 (two) times daily. 14 capsule 0   empagliflozin (JARDIANCE) 10 MG TABS tablet Take 1 tablet (10 mg total) by mouth daily before breakfast. 90 tablet 3   ergocalciferol (DRISDOL) 200 MCG/ML drops Take 6 mLs (48,000 Units total) by mouth 3 (three) times a week. 720 mL 3   fluticasone (FLONASE) 50 MCG/ACT nasal spray Place 2 sprays into both nostrils daily as needed for allergies or rhinitis.     furosemide (LASIX) 40 MG tablet Take 1 tablet (40 mg total) by mouth daily as needed. 30 tablet 6   Heparin Sodium, Porcine, (HEPARIN LOCK FLUSH IJ) Inject 5 mLs as directed 4 (four) times a week. Monday, Tuesday, Wednesday, Thursday in the morning with TPN     losartan (COZAAR) 25 MG tablet Take 0.5 tablets (12.5 mg total) by mouth at bedtime. 90 tablet 3   Multiple Vitamins-Minerals (ADULT GUMMY PO) Take 2 tablets by mouth daily.     omeprazole (PRILOSEC) 40 MG capsule TAKE 1 CAPSULE BY MOUTH EVERY DAY  BEFORE BREAKFAST 90 capsule 1   ondansetron (ZOFRAN-ODT) 8 MG disintegrating tablet TAKE 1 TABLET BY MOUTH EVERY 8 HOURS AS NEEDED FOR NAUSEA OR VOMITING. 30 tablet 11   sodium chloride 0.9 % infusion Inject 900 mLs into the vein every 14 (fourteen) days. Uses for TPN     spironolactone (ALDACTONE) 25 MG tablet TAKE 1 TABLET (25 MG TOTAL) BY MOUTH DAILY. 90 tablet 3   Teduglutide, rDNA, 5 MG KIT Inject 3.31 Units into the skin every morning. 1 kit 0   triamcinolone cream (KENALOG) 0.1 % Apply  1 application topically as needed (itching).     vitamin E 1000 UNIT capsule Take 1,000 Units by mouth daily.     XARELTO 20 MG TABS tablet TAKE 1 TABLET BY MOUTH DAILY WITH SUPPER. 90 tablet 2   No current facility-administered medications for this encounter.   Facility-Administered Medications Ordered in Other Encounters  Medication Dose Route Frequency Provider Last Rate Last Admin   perflutren lipid microspheres (DEFINITY) IV suspension  1-10 mL Intravenous PRN Manami Tutor, Shaune Pascal, MD   3 mL at 12/16/21 1054    Allergies  Allergen Reactions   Ivp Dye [Iodinated Contrast Media] Itching       Social History   Socioeconomic History   Marital status: Married    Spouse name: Not on file   Number of children: 1   Years of education: Not on file   Highest education level: Not on file  Occupational History   Not on file  Tobacco Use   Smoking status: Never   Smokeless tobacco: Never  Vaping Use   Vaping Use: Never used  Substance and Sexual Activity   Alcohol use: Never   Drug use: Never   Sexual activity: Yes    Comment: 1st intercourse 7 yo-5 partners  Other Topics Concern   Not on file  Social History Narrative   ** Merged History Encounter **       Married to [place], has 1 daughter and a granddaughter the patient's son died in childhood due to a motor vehicle wreck 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 No alcohol tobacco or drug  use 04/07/2017    Social Determinants of Health   Financial Resource Strain: Medium Risk (12/30/2017)   Overall Financial Resource Strain (CARDIA)    Difficulty of Paying Living Expenses: Somewhat hard  Food Insecurity: No Food Insecurity (12/30/2017)   Hunger Vital Sign    Worried About Running Out of Food in the Last Year: Never true    Ran Out of Food in the Last Year: Never true  Transportation Needs: No Transportation Needs (04/13/2020)   PRAPARE - Hydrologist (Medical): No    Lack of Transportation (Non-Medical): No  Physical Activity: Insufficiently Active (12/30/2017)   Exercise Vital Sign    Days of Exercise per Week: 7 days    Minutes of Exercise per Session: 20 min  Stress: No Stress Concern Present (12/30/2017)   [place]    Feeling of Stress : Not at all  Social Connections: Moderately Integrated (12/30/2017)   Social Connection and Isolation Panel [NHANES]    Frequency of Communication with Friends and Family: More than three times a week    Frequency of Social Gatherings with Friends and Family: Three times a week    Attends Religious Services: 1 to 4 times per year    Active Member of Clubs or Organizations: No    Attends Archivist Meetings: Never    Marital Status: Married  Human resources officer Violence: Not on file      Family History  Problem Relation Age of Onset   Diabetes Mother    Hypertension Mother    AAA (abdominal aortic aneurysm) Mother    Colon cancer Neg Hx    Stomach cancer Neg Hx     Vitals:   12/16/21 1050  BP: 138/70  Pulse: 68  SpO2: 100%  Weight: 65.8 kg (145 lb)  Wt Readings from Last 3 Encounters:  12/16/21 65.8 kg (145 lb)  12/09/21 64.2 kg (141 lb 9.6 oz)  12/02/21 64.1 kg (141 lb 4 oz)     PHYSICAL EXAM: General:  Sitting on exam table. No resp difficulty HEENT: normal Neck: supple. JVP to jaw  Carotids 2+ bilat;  no bruits. No lymphadenopathy or thryomegaly appreciated. Cor: PMI nondisplaced. Regular rate & rhythm. 2/6 MR left chest PICC Lungs: clear Abdomen: soft, nontender, nondistended. No hepatosplenomegaly. No bruits or masses. Good bowel sounds. Extremities: no cyanosis, clubbing, rash, trace edema Neuro: alert & orientedx3, cranial nerves grossly intact. moves all 4 extremities w/o difficulty. Affect pleasant  REDS 39%  ASSESSMENT & PLAN:  1. Systolic HF due to Ischemic cardiomyopathy - EF 45% echo 2015 RWMA inferior wall/apex - refused cath.  - Myoview 2015  EF 47% inferior/ apical scar with no ischemia - Echo 12/03/16 EF 40-45% AV sclerosis mean gradient 6 mmHg - Echo 11/21 EF 20-25% mild AS mean gradient 10  - Cath 06/26/20: EF 35-40% Mild CAD  (pLAD 30%, mLCX 30%, dRCA 30%). EF 35-40% with inferoapical aneurysm suggestive of prior infarct (depsite lack of significant CAD). No RHC attempted due to known lack of venous access - Echo 07/24/20 EF 35-40% with apical HK  no bubble done. Mild AS Personally reviewed - Echo today 12/16/21:  - cMRI 4/22 EF 30% Akinesis of the mid-apical inferior wall, apical septum and aneurysm of the LV apex. LGE findings suggest prior infarct with scar with no viability in the mid inferior wall and LV apex. RV normal  - NYHA II-III. Volume status worse recently and remains elevated. Increase lasix to 80 daily. Add k20.  - Failed Entresto due to low BP.  - Increase losartan to 25 daily  - Not on b-blocker due to bradycardia - Continue spiro 25 daily - Continue Jardiance 10 mg daily.  - Check BMET  - She has large infero-apical aneurysm in the absence of significant epicardial CAD. MRI confirms previous infarct. Possible thrombotic episode. Doubt she has LBBB cardiomyopathy as imaging doesn't match this  Likely not candidate for CRT-D due to diffuse venous occlusions and also high-risk of infection with ongoing TPN. - Echo today with very severe MR which was not  present on previous echos and cMRI last year. Diuretic requirements increasing. Management as above. She has diffuse venous occlusions up top but recent LE venous studies show patent CFVs bilaterally. She is not surgical candidate but suspect would be candidate for mTEER. Will plan RHC. Refer to structural team for TEE and mTEER eval. D/w patient and her husband and she would be ok to proceed.   2. CAD, non-obstructive - probable previous thrombotic MI in past - No s/s chest pain.  - No ASA with Xarelto - Have held off on statin due to poor nutrition. But likely ok to proceed now  - Continue Jardiance  3. Hypercoaguable state - chronically occluded RIJ - h/o mesenteric infarct  - on Xarelto. No bleeding   4. 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 Has limited venous access - Per Dr Carlean Purl   5. LBBB (129m) - as above. Doubt she qualifies for CRT  6. Aortic stenosis - mild by echo mean gradient 15 - stable on current echo.   7. H/o CVA - follows with Dr. SLeonie Man - consider statin   Total time spent 45 minutes. Over half that time spent discussing above.     DGlori Bickers  MD  11:27 AM

## 2021-12-15 NOTE — Progress Notes (Signed)
ADVANCED HF CLINIC NOTE  Referring Physician: Dr. Johnsie Cancel Primary Care: None Primary Cardiologist: Dr. Johnsie Cancel  HPI: [name] [name] is a 71 y.o. 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 Johnsie Cancel for further evalaution of her progressive cardiomyopathy.    Echo 2015  EF 45% with inferior wall and apical HK - she did not want to have heart cath. Myoview done at that time showed inferior/ apical scar with no ischemia   Echo 12/03/16 EF 40-45% AV sclerosis mean gradient 6 mmHg  She saw Dr. Johnsie Cancel 10/21. Echo 11/21 EF 20-25% mild AS mean gradient 10. Referred here   She saw Dr Donzetta Matters [facility] for RUE swelling duplex with occlusive thrombus in right IJ chronic with small right subclavian antegrade flow. Deferred any venography intervention  Cath 2/22: EF 35-40% Mild CAD  (pLAD 30%, mLCX 30%, dRCA 30%)ejection fraction. EF 35-40% with inferoapical aneurysm suggestive of prior infarct. No RHC attempted due to known lack of venous access  Admitted 3/22 with acute CVA/TIA with left-sided weakness. MRI of the brain, numerous small scattered acute cortical and subcortical infarct within the right cerebral hemisphere, right MCA territory as well as ACA and PCA territory. CTA of the head and neck, no large vessel occlusion.  70% atheromatous stenosis at the right carotid bulb.  50% stenosis at the left carotid bulb.  Echo 07/24/20 EF 35-40% with apical HK  no bubble done.  cMRI 4/22  EF 30% Akinesis of the mid-apical inferior wall, apical septum and aneurysm of the LV apex. LGE findings suggest prior infarct with scar with no viability in the mid inferior wall and LV apex. 2.  RV normal EF 49% 3.  No LV apical thrombus.  Echo 1/23 EF 35-40% Mild AS/mild to  moderate AI  She was seen at Urgent Care on  11/29/21 and felt to be volume overloaded. Urgent Care PA-C contacted HF team. She was instructed to take lasix 80 mg x3 days then 40 mg daily.   Here for routine f/u with echo. Getting TPN 5 nights per week (had venoplasty to get central line). Also drinks a whole lot so feels she is getting volume overloaded. Feels volume status is improved with lasix. Takes 21m some days and 824mabout 1-2x/week. Mild LLE edema. When fluid is controlled can go to store and do other activities. No CP, orthopnea or PND. Having dizziness and tinnitus. Has brain MRI on Firday.   Echo today 12/16/21: EF 35-40% RV ok. Severe MR. Mild to moderate AI/mild AS  Past Medical History:  Diagnosis Date   Abnormal LFTs 2013   Allergic rhinosinusitis    Allergy    Anemia of chronic disease    At risk for dental problems    Atypical nevus    Bacteremia 10/27/2020   Bacteremia due to Klebsiella pneumoniae 12/12/2011   Bacterial overgrowth syndrome    Brachial vein thrombus, left ([place]/30/2014   Carnitine deficiency ([facility] [place]/14/2020   Carotid stenosis    Carotid [place]):  R 40-59%; L 1-39% >> FU 1 year   Cataract    Closed left subtrochanteric femur fracture ([facility]/22/2019   Clotting disorder ([facility]   Congestive heart failure (CHF) ([facility]   EF 25-30%   COVID-19 2022   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  07/31/2017   Fever 10/29/2020   Fracture of left clavicle    GERD (gastroesophageal reflux disease)    History of blood transfusion 2013   anemia   Hx of cardiovascular stress test    Myoview (9/15):  inf-apical scar; no ischemia; EF 47% - low risk    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 12/12/2011   Osteoporosis    Pancytopenia 10/07/2011   Pathologic fracture of neck of femur ([place])    Personal history of colonic polyps    Renal insufficiency    hx of yrs ago   Serratia marcescens infection - bactermia assoc w/ PICC 01/18/2015   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   Small bowel  ischemia (HCC)    Splenomegaly    By ultrasound   Stroke ([place]) 07/2020   Thrombophilia (HCC)    Vitamin D deficiency     Current Outpatient Medications  Medication Sig Dispense Refill   acetaminophen (TYLENOL) 500 MG tablet Take 1,000 mg by mouth daily as needed for mild pain.     ADULT TPN Inject 1,800 mLs into the vein 4 (four) times a week. Pt receives home TPN from [facility]:  1800 mL bag, four nights weekly (Monday, Tuesday, Wednesday, Thursday for 8 hours (includes 1 hour taper up and down).     baclofen (LIORESAL) 10 MG tablet Take 1 tablet (10 mg total) by mouth 2 (two) times daily as needed for muscle spasms. 60 each 4   Calcium Carb-Cholecalciferol (CALCIUM + D3 PO) Take 2 tablets by mouth daily.     clobetasol ointment (TEMOVATE) 4.09 % Apply 1 application. topically 2 (two) times daily.     diphenhydrAMINE (BENADRYL) 25 mg capsule Take 25-50 mg by mouth every 6 (six) hours as needed for itching or sleep.      doxycycline (VIBRAMYCIN) 100 MG capsule Take 1 capsule (100 mg total) by mouth 2 (two) times daily. 14 capsule 0   empagliflozin (JARDIANCE) 10 MG TABS tablet Take 1 tablet (10 mg total) by mouth daily before breakfast. 90 tablet 3   ergocalciferol (DRISDOL) 200 MCG/ML drops Take 6 mLs (48,000 Units total) by mouth 3 (three) times a week. 720 mL 3   fluticasone (FLONASE) 50 MCG/ACT nasal spray Place 2 sprays into both nostrils daily as needed for allergies or rhinitis.     furosemide (LASIX) 40 MG tablet Take 1 tablet (40 mg total) by mouth daily as needed. 30 tablet 6   Heparin Sodium, Porcine, (HEPARIN LOCK FLUSH IJ) Inject 5 mLs as directed 4 (four) times a week. Monday, Tuesday, Wednesday, Thursday in the morning with TPN     losartan (COZAAR) 25 MG tablet Take 0.5 tablets (12.5 mg total) by mouth at bedtime. 90 tablet 3   Multiple Vitamins-Minerals (ADULT GUMMY PO) Take 2 tablets by mouth daily.     omeprazole (PRILOSEC) 40 MG capsule TAKE 1 CAPSULE BY MOUTH EVERY DAY  BEFORE BREAKFAST 90 capsule 1   ondansetron (ZOFRAN-ODT) 8 MG disintegrating tablet TAKE 1 TABLET BY MOUTH EVERY 8 HOURS AS NEEDED FOR NAUSEA OR VOMITING. 30 tablet 11   sodium chloride 0.9 % infusion Inject 900 mLs into the vein every 14 (fourteen) days. Uses for TPN     spironolactone (ALDACTONE) 25 MG tablet TAKE 1 TABLET (25 MG TOTAL) BY MOUTH DAILY. 90 tablet 3   Teduglutide, rDNA, 5 MG KIT Inject 3.31 Units into the skin every morning. 1 kit 0   triamcinolone cream (KENALOG) 0.1 % Apply  1 application topically as needed (itching).     vitamin E 1000 UNIT capsule Take 1,000 Units by mouth daily.     XARELTO 20 MG TABS tablet TAKE 1 TABLET BY MOUTH DAILY WITH SUPPER. 90 tablet 2   No current facility-administered medications for this encounter.   Facility-Administered Medications Ordered in Other Encounters  Medication Dose Route Frequency Provider Last Rate Last Admin   perflutren lipid microspheres (DEFINITY) IV suspension  1-10 mL Intravenous PRN Meleana Commerford, Shaune Pascal, MD   3 mL at 12/16/21 1054    Allergies  Allergen Reactions   Ivp Dye [Iodinated Contrast Media] Itching       Social History   Socioeconomic History   Marital status: Married    Spouse name: Not on file   Number of children: 1   Years of education: Not on file   Highest education level: Not on file  Occupational History   Not on file  Tobacco Use   Smoking status: Never   Smokeless tobacco: Never  Vaping Use   Vaping Use: Never used  Substance and Sexual Activity   Alcohol use: Never   Drug use: Never   Sexual activity: Yes    Comment: 1st intercourse 7 yo-5 partners  Other Topics Concern   Not on file  Social History Narrative   ** Merged History Encounter **       Married to [place], has 1 daughter and a granddaughter the patient's son died in childhood due to a motor vehicle wreck 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 No alcohol tobacco or drug  use 04/07/2017    Social Determinants of Health   Financial Resource Strain: Medium Risk (12/30/2017)   Overall Financial Resource Strain (CARDIA)    Difficulty of Paying Living Expenses: Somewhat hard  Food Insecurity: No Food Insecurity (12/30/2017)   Hunger Vital Sign    Worried About Running Out of Food in the Last Year: Never true    Ran Out of Food in the Last Year: Never true  Transportation Needs: No Transportation Needs (04/13/2020)   PRAPARE - Hydrologist (Medical): No    Lack of Transportation (Non-Medical): No  Physical Activity: Insufficiently Active (12/30/2017)   Exercise Vital Sign    Days of Exercise per Week: 7 days    Minutes of Exercise per Session: 20 min  Stress: No Stress Concern Present (12/30/2017)   [place]    Feeling of Stress : Not at all  Social Connections: Moderately Integrated (12/30/2017)   Social Connection and Isolation Panel [NHANES]    Frequency of Communication with Friends and Family: More than three times a week    Frequency of Social Gatherings with Friends and Family: Three times a week    Attends Religious Services: 1 to 4 times per year    Active Member of Clubs or Organizations: No    Attends Archivist Meetings: Never    Marital Status: Married  Human resources officer Violence: Not on file      Family History  Problem Relation Age of Onset   Diabetes Mother    Hypertension Mother    AAA (abdominal aortic aneurysm) Mother    Colon cancer Neg Hx    Stomach cancer Neg Hx     Vitals:   12/16/21 1050  BP: 138/70  Pulse: 68  SpO2: 100%  Weight: 65.8 kg (145 lb)  Wt Readings from Last 3 Encounters:  12/16/21 65.8 kg (145 lb)  12/09/21 64.2 kg (141 lb 9.6 oz)  12/02/21 64.1 kg (141 lb 4 oz)     PHYSICAL EXAM: General:  Sitting on exam table. No resp difficulty HEENT: normal Neck: supple. JVP to jaw  Carotids 2+ bilat;  no bruits. No lymphadenopathy or thryomegaly appreciated. Cor: PMI nondisplaced. Regular rate & rhythm. 2/6 MR left chest PICC Lungs: clear Abdomen: soft, nontender, nondistended. No hepatosplenomegaly. No bruits or masses. Good bowel sounds. Extremities: no cyanosis, clubbing, rash, trace edema Neuro: alert & orientedx3, cranial nerves grossly intact. moves all 4 extremities w/o difficulty. Affect pleasant  REDS 39%  ASSESSMENT & PLAN:  1. Systolic HF due to Ischemic cardiomyopathy - EF 45% echo 2015 RWMA inferior wall/apex - refused cath.  - Myoview 2015  EF 47% inferior/ apical scar with no ischemia - Echo 12/03/16 EF 40-45% AV sclerosis mean gradient 6 mmHg - Echo 11/21 EF 20-25% mild AS mean gradient 10  - Cath 06/26/20: EF 35-40% Mild CAD  (pLAD 30%, mLCX 30%, dRCA 30%). EF 35-40% with inferoapical aneurysm suggestive of prior infarct (depsite lack of significant CAD). No RHC attempted due to known lack of venous access - Echo 07/24/20 EF 35-40% with apical HK  no bubble done. Mild AS Personally reviewed - Echo today 12/16/21:  - cMRI 4/22 EF 30% Akinesis of the mid-apical inferior wall, apical septum and aneurysm of the LV apex. LGE findings suggest prior infarct with scar with no viability in the mid inferior wall and LV apex. RV normal  - NYHA II-III. Volume status worse recently and remains elevated. Increase lasix to 80 daily. Add k20.  - Failed Entresto due to low BP.  - Increase losartan to 25 daily  - Not on b-blocker due to bradycardia - Continue spiro 25 daily - Continue Jardiance 10 mg daily.  - Check BMET  - She has large infero-apical aneurysm in the absence of significant epicardial CAD. MRI confirms previous infarct. Possible thrombotic episode. Doubt she has LBBB cardiomyopathy as imaging doesn't match this  Likely not candidate for CRT-D due to diffuse venous occlusions and also high-risk of infection with ongoing TPN. - Echo today with very severe MR which was not  present on previous echos and cMRI last year. Diuretic requirements increasing. Management as above. She has diffuse venous occlusions up top but recent LE venous studies show patent CFVs bilaterally. She is not surgical candidate but suspect would be candidate for mTEER. Will plan RHC. Refer to structural team for TEE and mTEER eval. D/w patient and her husband and she would be ok to proceed.   2. CAD, non-obstructive - probable previous thrombotic MI in past - No s/s chest pain.  - No ASA with Xarelto - Have held off on statin due to poor nutrition. But likely ok to proceed now  - Continue Jardiance  3. Hypercoaguable state - chronically occluded RIJ - h/o mesenteric infarct  - on Xarelto. No bleeding   4. 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 Has limited venous access - Per Dr Carlean Purl   5. LBBB (129m) - as above. Doubt she qualifies for CRT  6. Aortic stenosis - mild by echo mean gradient 15 - stable on current echo.   7. H/o CVA - follows with Dr. SLeonie Man - consider statin   Total time spent 45 minutes. Over half that time spent discussing above.     DGlori Bickers  MD  11:27 AM

## 2021-12-16 ENCOUNTER — Ambulatory Visit (HOSPITAL_COMMUNITY)
Admission: RE | Admit: 2021-12-16 | Discharge: 2021-12-16 | Disposition: A | Discharge status: Home / Self Care | Payer: Medicare Other | Source: Ambulatory Visit | Attending: Internal Medicine | Admitting: Internal Medicine

## 2021-12-16 ENCOUNTER — Encounter (HOSPITAL_COMMUNITY): Payer: Self-pay | Admitting: Internal Medicine

## 2021-12-16 ENCOUNTER — Other Ambulatory Visit (HOSPITAL_COMMUNITY): Payer: Self-pay

## 2021-12-16 ENCOUNTER — Ambulatory Visit (HOSPITAL_BASED_OUTPATIENT_CLINIC_OR_DEPARTMENT_OTHER)
Admission: RE | Admit: 2021-12-16 | Discharge: 2021-12-16 | Disposition: A | Discharge status: Home / Self Care | Payer: Medicare Other | Source: Ambulatory Visit | Attending: Internal Medicine | Admitting: Internal Medicine

## 2021-12-16 VITALS — BP 138/70 | HR 68 | Wt 145.0 lb

## 2021-12-16 DIAGNOSIS — L905 Scar conditions and fibrosis of skin: Secondary | ICD-10-CM | POA: Diagnosis not present

## 2021-12-16 DIAGNOSIS — R42 Dizziness and giddiness: Secondary | ICD-10-CM | POA: Diagnosis not present

## 2021-12-16 DIAGNOSIS — N61 Mastitis without abscess: Secondary | ICD-10-CM

## 2021-12-16 DIAGNOSIS — N1831 Chronic kidney disease, stage 3a: Secondary | ICD-10-CM

## 2021-12-16 DIAGNOSIS — R609 Edema, unspecified: Secondary | ICD-10-CM | POA: Diagnosis not present

## 2021-12-16 DIAGNOSIS — Z860101 Personal history of adenomatous and serrated colon polyps: Secondary | ICD-10-CM

## 2021-12-16 DIAGNOSIS — Z79899 Other long term (current) drug therapy: Secondary | ICD-10-CM | POA: Insufficient documentation

## 2021-12-16 DIAGNOSIS — I447 Left bundle-branch block, unspecified: Secondary | ICD-10-CM | POA: Diagnosis not present

## 2021-12-16 DIAGNOSIS — D539 Nutritional anemia, unspecified: Secondary | ICD-10-CM

## 2021-12-16 DIAGNOSIS — I5022 Chronic systolic (congestive) heart failure: Secondary | ICD-10-CM

## 2021-12-16 DIAGNOSIS — Z86010 Personal history of colonic polyps: Secondary | ICD-10-CM

## 2021-12-16 DIAGNOSIS — Z8673 Personal history of transient ischemic attack (TIA), and cerebral infarction without residual deficits: Secondary | ICD-10-CM | POA: Diagnosis not present

## 2021-12-16 DIAGNOSIS — I080 Rheumatic disorders of both mitral and aortic valves: Secondary | ICD-10-CM | POA: Diagnosis not present

## 2021-12-16 DIAGNOSIS — Z7984 Long term (current) use of oral hypoglycemic drugs: Secondary | ICD-10-CM | POA: Diagnosis not present

## 2021-12-16 DIAGNOSIS — I4891 Unspecified atrial fibrillation: Secondary | ICD-10-CM

## 2021-12-16 DIAGNOSIS — K760 Fatty (change of) liver, not elsewhere classified: Secondary | ICD-10-CM

## 2021-12-16 DIAGNOSIS — K6389 Other specified diseases of intestine: Secondary | ICD-10-CM

## 2021-12-16 DIAGNOSIS — D696 Thrombocytopenia, unspecified: Secondary | ICD-10-CM

## 2021-12-16 DIAGNOSIS — Z7901 Long term (current) use of anticoagulants: Secondary | ICD-10-CM | POA: Insufficient documentation

## 2021-12-16 DIAGNOSIS — D6859 Other primary thrombophilia: Secondary | ICD-10-CM

## 2021-12-16 DIAGNOSIS — I2510 Atherosclerotic heart disease of native coronary artery without angina pectoris: Secondary | ICD-10-CM | POA: Insufficient documentation

## 2021-12-16 DIAGNOSIS — I340 Nonrheumatic mitral (valve) insufficiency: Secondary | ICD-10-CM

## 2021-12-16 DIAGNOSIS — K90829 Short bowel syndrome, unspecified: Secondary | ICD-10-CM

## 2021-12-16 DIAGNOSIS — D638 Anemia in other chronic diseases classified elsewhere: Secondary | ICD-10-CM

## 2021-12-16 DIAGNOSIS — R161 Splenomegaly, not elsewhere classified: Secondary | ICD-10-CM

## 2021-12-16 DIAGNOSIS — E876 Hypokalemia: Secondary | ICD-10-CM

## 2021-12-16 DIAGNOSIS — I255 Ischemic cardiomyopathy: Secondary | ICD-10-CM | POA: Insufficient documentation

## 2021-12-16 DIAGNOSIS — K912 Postsurgical malabsorption, not elsewhere classified: Secondary | ICD-10-CM | POA: Diagnosis not present

## 2021-12-16 DIAGNOSIS — E7140 Disorder of carnitine metabolism, unspecified: Secondary | ICD-10-CM

## 2021-12-16 DIAGNOSIS — R7402 Elevation of levels of lactic acid dehydrogenase (LDH): Secondary | ICD-10-CM

## 2021-12-16 DIAGNOSIS — Z87891 Personal history of nicotine dependence: Secondary | ICD-10-CM | POA: Diagnosis not present

## 2021-12-16 DIAGNOSIS — Z8719 Personal history of other diseases of the digestive system: Secondary | ICD-10-CM

## 2021-12-16 DIAGNOSIS — R001 Bradycardia, unspecified: Secondary | ICD-10-CM

## 2021-12-16 DIAGNOSIS — E871 Hypo-osmolality and hyponatremia: Secondary | ICD-10-CM

## 2021-12-16 DIAGNOSIS — A498 Other bacterial infections of unspecified site: Secondary | ICD-10-CM

## 2021-12-16 DIAGNOSIS — L03114 Cellulitis of left upper limb: Secondary | ICD-10-CM

## 2021-12-16 LAB — ECHOCARDIOGRAM COMPLETE
AR max vel: 1.75 cm2
AV Area VTI: 1.67 cm2
AV Area mean vel: 1.84 cm2
AV Mean grad: 12 mmHg
AV Peak grad: 18.8 mmHg
Ao pk vel: 2.17 m/s
Area-P 1/2: 4.39 cm2
MV M vel: 4.19 m/s
MV Peak grad: 70.2 mmHg
P 1/2 time: 401 msec
Radius: 0.65 cm
S' Lateral: 5.2 cm

## 2021-12-16 MED ORDER — POTASSIUM CHLORIDE CRYS ER 20 MEQ PO TBCR: 20.0000 meq | EXTENDED_RELEASE_TABLET | Freq: Every day | ORAL | 3 refills | Status: AC

## 2021-12-16 MED ORDER — LOSARTAN POTASSIUM 25 MG PO TABS: 25.0000 mg | ORAL_TABLET | Freq: Every day | ORAL | 3 refills | Status: DC

## 2021-12-16 MED ORDER — PERFLUTREN LIPID MICROSPHERE
1.0000 mL | INTRAVENOUS | Status: DC | PRN
  Administered 2021-12-16: 3 mL via INTRAVENOUS

## 2021-12-16 MED ORDER — FUROSEMIDE 40 MG PO TABS: 80.0000 mg | ORAL_TABLET | Freq: Every day | ORAL | 3 refills | Status: DC

## 2021-12-16 NOTE — Progress Notes (Signed)
ReDS Vest / Clip - 12/16/21 1200       ReDS Vest / Clip   Station Marker C    Ruler Value 31    ReDS Value Range Moderate volume overload    ReDS Actual Value 39

## 2021-12-16 NOTE — Patient Instructions (Addendum)
Increase Losartan to '25mg'$  daily  Increase your lasix to '80mg'$  daily  Start 67mq (1 Tab) of poatassium daily   You are scheduled for a Cardiac Catheterization on Wednesday, August 23 with Dr. DGlori Bickers  1. [facility] [address] [place] [id number] 9:30 AM (This time is two hours before your procedure to ensure your preparation). Free valet parking service is available.   Special note: Every effort is made to have your procedure done on time. Please understand that emergencies sometimes delay scheduled procedures.  2. Diet: Do not eat solid foods after midnight.  You may have clear liquids until 5 AM upon the day of the procedure.  3. Labs: You will done the morning of procedure   4. Medication instructions in preparation for your procedure:   Contrast Allergy: No   Hold your Xarelto the day before and the morning of your procedure   Stop taking, Furosemide Jardiance and Spironolactone  Wednesday, August 23,   On the morning of your procedure, take  any morning medicines NOT listed above.  You may use sips of water.  5. Plan to go home the same day, you will only stay overnight if medically necessary. 6. You MUST have a responsible adult to drive you home. 7. An adult MUST be with you the first 24 hours after you arrive home. 8. Bring a current list of your medications, and the last time and date medication taken. 9. Bring ID and current insurance cards. 10.Please wear clothes that are easy to get on and off and wear slip-on shoes.   Your physician recommends that you schedule a follow-up appointment in: 3 months   If you have any questions or concerns before your next appointment please send [place] message through [place] call our office at [phone]    TO LEAVE A MESSAGE FOR THE NURSE SELECT OPTION 2, PLEASE LEAVE A MESSAGE INCLUDING: YOUR NAME DATE OF BIRTH CALL BACK NUMBER REASON FOR CALL**this  is important as we prioritize the call backs  YOU WILL RECEIVE A CALL BACK THE SAME DAY AS LONG AS YOU CALL BEFORE 4:00 PM  [facility] you and your health needs are our priority. As part of our continuing mission to provide you with exceptional heart care, we have created designated Provider Care Teams. These Care Teams include your primary Cardiologist (physician) and Advanced Practice Providers (APPs- Physician Assistants and Nurse Practitioners) who all work together to provide you with the care you need, when you need it.   You may see any of the following providers on your designated Care Team at your next follow up: Dr DGlori BickersDr DHaynes Kerns NP BLyda Jester [facility] PUtahLAudry Riles PharmD   Please be sure to bring in all your medications bottles to every appointment.

## 2021-12-19 ENCOUNTER — Ambulatory Visit: Payer: 59

## 2021-12-19 ENCOUNTER — Encounter: Payer: Self-pay | Admitting: Internal Medicine

## 2021-12-19 ENCOUNTER — Encounter (HOSPITAL_COMMUNITY): Payer: Self-pay | Admitting: Internal Medicine

## 2021-12-20 ENCOUNTER — Ambulatory Visit
Admission: RE | Admit: 2021-12-20 | Discharge: 2021-12-20 | Disposition: A | Discharge status: Home / Self Care | Payer: Medicare Other | Source: Ambulatory Visit | Attending: Otolaryngology | Admitting: Otolaryngology

## 2021-12-20 DIAGNOSIS — H903 Sensorineural hearing loss, bilateral: Secondary | ICD-10-CM

## 2021-12-20 DIAGNOSIS — I6782 Cerebral ischemia: Secondary | ICD-10-CM | POA: Diagnosis not present

## 2021-12-20 DIAGNOSIS — I6381 Other cerebral infarction due to occlusion or stenosis of small artery: Secondary | ICD-10-CM | POA: Diagnosis not present

## 2021-12-20 DIAGNOSIS — G9389 Other specified disorders of brain: Secondary | ICD-10-CM | POA: Diagnosis not present

## 2021-12-20 DIAGNOSIS — I611 Nontraumatic intracerebral hemorrhage in hemisphere, cortical: Secondary | ICD-10-CM | POA: Diagnosis not present

## 2021-12-20 MED ORDER — GADOBENATE DIMEGLUMINE 529 MG/ML IV SOLN
13.0000 mL | Freq: Once | INTRAVENOUS | Status: AC | PRN
  Administered 2021-12-20: 13 mL via INTRAVENOUS

## 2021-12-25 ENCOUNTER — Encounter: Payer: Self-pay | Admitting: Adult Health

## 2021-12-25 ENCOUNTER — Inpatient Hospital Stay: Payer: Medicare Other | Attending: Hematology and Oncology | Admitting: Adult Health

## 2021-12-25 ENCOUNTER — Inpatient Hospital Stay: Payer: Medicare Other

## 2021-12-25 ENCOUNTER — Other Ambulatory Visit: Payer: Self-pay

## 2021-12-25 VITALS — BP 116/56 | HR 70 | Temp 97.9°F | Resp 16 | Ht 68.0 in | Wt 145.6 lb

## 2021-12-25 DIAGNOSIS — I639 Cerebral infarction, unspecified: Secondary | ICD-10-CM

## 2021-12-25 DIAGNOSIS — D539 Nutritional anemia, unspecified: Secondary | ICD-10-CM

## 2021-12-25 DIAGNOSIS — Z79899 Other long term (current) drug therapy: Secondary | ICD-10-CM | POA: Insufficient documentation

## 2021-12-25 DIAGNOSIS — D638 Anemia in other chronic diseases classified elsewhere: Secondary | ICD-10-CM

## 2021-12-25 DIAGNOSIS — D631 Anemia in chronic kidney disease: Secondary | ICD-10-CM | POA: Insufficient documentation

## 2021-12-25 DIAGNOSIS — K912 Postsurgical malabsorption, not elsewhere classified: Secondary | ICD-10-CM

## 2021-12-25 DIAGNOSIS — Z8673 Personal history of transient ischemic attack (TIA), and cerebral infarction without residual deficits: Secondary | ICD-10-CM

## 2021-12-25 DIAGNOSIS — K9089 Other intestinal malabsorption: Secondary | ICD-10-CM | POA: Insufficient documentation

## 2021-12-25 DIAGNOSIS — D696 Thrombocytopenia, unspecified: Secondary | ICD-10-CM | POA: Insufficient documentation

## 2021-12-25 DIAGNOSIS — N1831 Chronic kidney disease, stage 3a: Secondary | ICD-10-CM | POA: Diagnosis not present

## 2021-12-25 LAB — CBC WITH DIFFERENTIAL (CANCER CENTER ONLY)
Abs Immature Granulocytes: 0.01 10*3/uL (ref 0.00–0.07)
Basophils Absolute: 0.1 10*3/uL (ref 0.0–0.1)
Basophils Relative: 1 %
Eosinophils Absolute: 0.4 10*3/uL (ref 0.0–0.5)
Eosinophils Relative: 8 %
HCT: 31.9 % — ABNORMAL LOW (ref 36.0–46.0)
Hemoglobin: 10.1 g/dL — ABNORMAL LOW (ref 12.0–15.0)
Immature Granulocytes: 0 %
Lymphocytes Relative: 29 %
Lymphs Abs: 1.4 10*3/uL (ref 0.7–4.0)
MCH: 29.8 pg (ref 26.0–34.0)
MCHC: 31.7 g/dL (ref 30.0–36.0)
MCV: 94.1 fL (ref 80.0–100.0)
Monocytes Absolute: 0.6 10*3/uL (ref 0.1–1.0)
Monocytes Relative: 12 %
Neutro Abs: 2.4 10*3/uL (ref 1.7–7.7)
Neutrophils Relative %: 50 %
Platelet Count: 68 10*3/uL — ABNORMAL LOW (ref 150–400)
RBC: 3.39 MIL/uL — ABNORMAL LOW (ref 3.87–5.11)
RDW: 16.4 % — ABNORMAL HIGH (ref 11.5–15.5)
WBC Count: 4.8 10*3/uL (ref 4.0–10.5)
nRBC: 0 % (ref 0.0–0.2)

## 2021-12-25 LAB — CMP (CANCER CENTER ONLY)
ALT: 18 U/L (ref 0–44)
AST: 26 U/L (ref 15–41)
Albumin: 3.3 g/dL — ABNORMAL LOW (ref 3.5–5.0)
Alkaline Phosphatase: 149 U/L — ABNORMAL HIGH (ref 38–126)
Anion gap: 3 — ABNORMAL LOW (ref 5–15)
BUN: 32 mg/dL — ABNORMAL HIGH (ref 8–23)
CO2: 29 mmol/L (ref 22–32)
Calcium: 8.9 mg/dL (ref 8.9–10.3)
Chloride: 103 mmol/L (ref 98–111)
Creatinine: 1.27 mg/dL — ABNORMAL HIGH (ref 0.44–1.00)
GFR, Estimated: 45 mL/min — ABNORMAL LOW (ref >60)
Glucose, Bld: 100 mg/dL — ABNORMAL HIGH (ref 70–99)
Potassium: 4.4 mmol/L (ref 3.5–5.1)
Sodium: 135 mmol/L (ref 135–145)
Total Bilirubin: 0.7 mg/dL (ref 0.3–1.2)
Total Protein: 8.2 g/dL — ABNORMAL HIGH (ref 6.5–8.1)

## 2021-12-25 LAB — PREALBUMIN: Prealbumin: 18 mg/dL (ref 18–38)

## 2021-12-25 LAB — MAGNESIUM: Magnesium: 2.2 mg/dL (ref 1.7–2.4)

## 2021-12-25 LAB — TRIGLYCERIDES: Triglycerides: 57 mg/dL (ref <150)

## 2021-12-25 LAB — PHOSPHORUS: Phosphorus: 3.8 mg/dL (ref 2.5–4.6)

## 2021-12-25 MED ORDER — DARBEPOETIN ALFA 300 MCG/0.6ML IJ SOSY
300.0000 ug | PREFILLED_SYRINGE | Freq: Once | INTRAMUSCULAR | Status: AC
  Administered 2021-12-25: 300 ug via SUBCUTANEOUS
  Filled 2021-12-25: qty 0.6

## 2021-12-25 NOTE — Patient Instructions (Signed)

## 2021-12-25 NOTE — Progress Notes (Signed)
[facility]:    [name] Minium, MD [address] [place] [address]   DIAGNOSIS: Anemia of chronic disease  SUMMARY OF HEMATOLOGIC HISTORY:  Normochromic normocytic anemia due 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  Aranesp: 10 injection in 2014, 3 in 2015, and discontinued after needing just 1 in 2016. Anemia returned in 2022 and Aranesp restarted on 10/26/2020 at 331mg every 3 weeks for hemoglobin less than 11.   Iron Deficiency: IV venofer given x 2 beginning 09/16/2021 She underwent colonoscopy and upper endoscopy on 09/20/2021 that showed benign inflammation in the stomach, 1 benign colon polyp, 1 sessile serrated polyp with low to high-grade dysplasia, recall colonoscopy in 6 months Thrombocytopenia:  Her plt count is 100 at baseline.     CURRENT THERAPY: Aranesp, intermittent IV iron  INTERVAL HISTORY: [name] [name] 70 y.o. female returns for follow-up of her anemia of chronic disease.  This is related to her short gut syndrome and she continues on TPN for this with good tolerance.    [name] [name] reports being dizzy x 1 year.  She went to ER recently for dizziness and shortness of breath.  She has a leaky heart valve and has upcoming cardiac catheterization on 01/01/2022 with him.  MRI completed on 8/14 demonstrates worsening in her previous CVA.     Patient Active Problem List   Diagnosis Date Noted   STEC (Shiga toxin-producing Escherichia coli) 07/01/2021   Streptococcal sepsis, unspecified ([place] 06/30/2021   Left arm cellulitis 06/29/2021   Cellulitis of left breast 06/29/2021   Humerus fracture - right 06/29/2021   New onset a-fib ([place] 06/29/2021   Carotid artery stenosis 10/31/2020   Macrocytic anemia  10/27/2020   NAFLD (nonalcoholic fatty liver disease) 09/21/2020   Stroke (cerebrum) ([place] 07/25/2020   History of CVA (cerebrovascular accident) 07/24/2020   Thrombocytopenia ([place] 07/24/2020   Chronic renal failure, stage 3a ([place] 07/24/2020   Pressure injury of skin 07/24/2020   Stroke ([facility] 07/23/2020   Carnitine deficiency ([facility] 05/25/2018   Hypotension 12/30/2017   Bradycardia 12/30/2017   Deep venous thrombosis ([place] left subclavian vein 07/31/2017   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[facility] 07/31/2017   Degenerative joint disease 07/08/2016   PICC line infection 05/16/2015   H/O mesenteric infarction 11/17/2013   Splenomegaly    Hx of adenomatous colonic polyps 136/62/9476  Chronic systolic heart failure (HCC) - LVEF 35-40% 12/25/2011   Hypokalemia 12/14/2011   Acute renal failure superimposed on stage 3a chronic kidney disease ([place] [place] 12/08/2011   Hyponatremia 12/08/2011   Osteoporosis 03/14/2010   Anemia of chronic disease 10/01/2009   GERD (gastroesophageal reflux disease) 11/19/2008   ALKALINE PHOSPHATASE, ELEVATED 10/12/2008   VITAMIN D DEFICIENCY 01/19/2008   TRANSAMINASES, SERUM, ELEVATED 01/19/2008   Bacterial overgrowth syndrome 11/19/2004   Acquired short bowel syndrome 11/20/2003    is allergic to ivp dye [iodinated contrast media].  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   Carnitine deficiency ([place]) 05/25/2018   Carotid stenosis    Carotid US (9/15):  R 40-59%; L 1-39% >> FU 1 year   Cataract    Closed left subtrochanteric femur fracture ([place]) 12/31/2017   Clotting disorder (HCC)    Congestive heart failure (CHF) (HCC)    EF 25-30%   COVID-19 2022    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   Fever 10/29/2020   Fracture of left clavicle    GERD (gastroesophageal reflux disease)    History of blood transfusion 2013   anemia   Hx of cardiovascular stress test    Myoview (9/15):  inf-apical scar; no ischemia; EF 47% - low risk    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 12/12/2011   Osteoporosis    Pancytopenia 10/07/2011   Pathologic fracture of neck of femur ([place])    Personal history of colonic polyps    Renal insufficiency    hx of yrs ago   Serratia marcescens infection - bactermia assoc w/ PICC 01/18/2015   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HCC)    Splenomegaly    By ultrasound   Stroke ([place]) 07/2020   Thrombophilia ([place])    Vitamin D deficiency     SURGICAL HISTORY: Past Surgical History:  Procedure Laterality Date   APPENDECTOMY  yrs ago   CHOLECYSTECTOMY  yrs ago   COLONOSCOPY  12/05/2005   internal hemorrhoids (for polyp surveillance)   COLONOSCOPY  04/26/2012   Procedure: COLONOSCOPY;  Surgeon: Gatha Mayer, MD;  Location: [facility];  Service: Endoscopy;  Laterality: N/A;   COLONOSCOPY WITH PROPOFOL N/A 03/18/2016   Procedure: COLONOSCOPY WITH PROPOFOL;  Surgeon: Gatha Mayer, MD;  Location: [facility];  Service: Endoscopy;  Laterality: N/A;   ESOPHAGOGASTRODUODENOSCOPY  01/22/2009   erosive esophagitis   HARDWARE REMOVAL Left 12/31/2017   Procedure: HARDWARE REMOVAL;  Surgeon: Rod Can, MD;  Location: [facility];  Service: Orthopedics;  Laterality: Left;   INTRAMEDULLARY (IM) NAIL INTERTROCHANTERIC Left 12/31/2017   Procedure: INTRAMEDULLARY (IM) NAIL SUBTROCHANTRIC;  Surgeon: Rod Can, MD;  Location: [facility];  Service: Orthopedics;  Laterality: Left;   IR CV LINE INJECTION  03/23/2018   IR CV LINE INJECTION  10/02/2021   IR FLUORO GUIDE CV LINE LEFT  01/01/2018   IR FLUORO GUIDE CV LINE LEFT  03/24/2018   IR FLUORO GUIDE CV LINE LEFT  05/21/2018    IR FLUORO GUIDE CV LINE LEFT  07/09/2018   IR FLUORO GUIDE CV LINE LEFT  12/28/2020   IR FLUORO GUIDE CV LINE LEFT  07/02/2021   IR FLUORO GUIDE CV LINE RIGHT  11/01/2020   IR GENERIC HISTORICAL  07/04/2016   IR REMOVAL TUN CV CATH W/O FL 07/04/2016 Ascencion Dike, PA-C [facility]   IR GENERIC HISTORICAL  07/10/2016   IR US GUIDE VASC ACCESS LEFT 07/10/2016 Arne Cleveland, MD [facility]   IR GENERIC HISTORICAL  07/10/2016   IR FLUORO GUIDE CV LINE LEFT 07/10/2016 Arne Cleveland, MD [facility]   IR PATIENT EVAL TECH 0-60 MINS  10/07/2017   IR PATIENT EVAL TECH 0-60 MINS  03/09/2018   IR RADIOLOGIST EVAL & MGMT  07/21/2017   IR RADIOLOGIST EVAL & MGMT  11/14/2020   IR RADIOLOGIST EVAL & MGMT  11/27/2020   IR RADIOLOGIST EVAL & MGMT  01/24/2021   IR REMOVAL TUN CV CATH  W/O FL  12/28/2020   IR TRANSCATH PLC STENT  INITIAL VEIN  INC ANGIOPLASTY  12/28/2020   IR US GUIDE VASC ACCESS LEFT  11/01/2020   IR US GUIDE VASC ACCESS LEFT  12/28/2020   IR US GUIDE VASC ACCESS LEFT  12/28/2020   IR US GUIDE VASC ACCESS LEFT  12/28/2020   IR US GUIDE VASC ACCESS RIGHT  11/01/2020   IR VENO/EXT/UNI LEFT  11/01/2020   IR VENOCAVAGRAM SVC  12/28/2020   LEFT HEART CATH AND CORONARY ANGIOGRAPHY N/A 06/26/2020   Procedure: LEFT HEART CATH AND CORONARY ANGIOGRAPHY;  Surgeon: Jolaine Artist, MD;  Location: [facility];  Service: Cardiovascular;  Laterality: N/A;   ORIF PROXIMAL FEMORAL FRACTURE W/ ITST NAIL SYSTEM  03/2007   left, Dr. Shellia Carwin   SMALL INTESTINE SURGERY  2005   multiple with right colon resection for ischemia/infarct    SOCIAL HISTORY: Social History   Socioeconomic History   Marital status: Married    Spouse name: Not on file   Number of children: 1   Years of education: Not on file   Highest education level: Not on file  Occupational History   Not on file  Tobacco Use   Smoking status: Never   Smokeless tobacco: Never  Vaping Use   Vaping Use: Never used  Substance and Sexual  Activity   Alcohol use: Never   Drug use: Never   Sexual activity: Yes    Comment: 1st intercourse 61 yo-5 partners  Other Topics Concern   Not on file  Social History Narrative   ** Merged History Encounter **       Married to Herbie [place], has 1 daughter and a granddaughter the patient's son died in childhood due to a motor vehicle wreck 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 No alcohol tobacco or drug use 04/07/2017    Social Determinants of Health   Financial Resource Strain: Medium Risk (12/30/2017)   Overall Financial Resource Strain (CARDIA)    Difficulty of Paying Living Expenses: Somewhat hard  Food Insecurity: No Food Insecurity (12/30/2017)   Hunger Vital Sign    Worried About Running Out of Food in the Last Year: Never true    Ran Out of Food in the Last Year: Never true  Transportation Needs: No Transportation Needs (04/13/2020)   PRAPARE - Hydrologist (Medical): No    Lack of Transportation (Non-Medical): No  Physical Activity: Insufficiently Active (12/30/2017)   Exercise Vital Sign    Days of Exercise per Week: 7 days    Minutes of Exercise per Session: 20 min  Stress: No Stress Concern Present (12/30/2017)   [place]    Feeling of Stress : Not at all  Social Connections: Moderately Integrated (12/30/2017)   Social Connection and Isolation Panel [NHANES]    Frequency of Communication with Friends and Family: More than three times a week    Frequency of Social Gatherings with Friends and Family: Three times a week    Attends Religious Services: 1 to 4 times per year    Active Member of Clubs or Organizations: No    Attends Archivist Meetings: Never    Marital Status: Married  Human resources officer Violence: Not on file    FAMILY HISTORY: Family History  Problem Relation Age of Onset   Diabetes Mother    Hypertension Mother     AAA (abdominal  aortic aneurysm) Mother    Colon cancer Neg Hx    Stomach cancer Neg Hx     Review of Systems  Constitutional:  Negative for appetite change, chills, fatigue, fever and unexpected weight change.  HENT:   Negative for hearing loss, lump/mass and trouble swallowing.   Eyes:  Negative for eye problems and icterus.  Respiratory:  Negative for chest tightness, cough and shortness of breath.   Cardiovascular:  Negative for chest pain, leg swelling and palpitations.  Gastrointestinal:  Negative for abdominal distention, abdominal pain, constipation, diarrhea, nausea and vomiting.  Endocrine: Negative for hot flashes.  Genitourinary:  Negative for difficulty urinating.   Musculoskeletal:  Negative for arthralgias.  Skin:  Negative for itching and rash.  Neurological:  Negative for dizziness, extremity weakness, headaches and numbness.  Hematological:  Negative for adenopathy. Does not bruise/bleed easily.  Psychiatric/Behavioral:  Negative for depression. The patient is not nervous/anxious.       PHYSICAL EXAMINATION  ECOG PERFORMANCE STATUS: 1 - Symptomatic but completely ambulatory  Vitals:   12/25/21 0906  BP: (!) 116/56  Pulse: 70  Resp: 16  Temp: 97.9 F (36.6 C)  SpO2: 100%    Physical Exam Constitutional:      General: She is not in acute distress.    Appearance: Normal appearance. She is not toxic-appearing.  HENT:     Head: Normocephalic and atraumatic.  Eyes:     General: No scleral icterus. Cardiovascular:     Rate and Rhythm: Normal rate and regular rhythm.     Pulses: Normal pulses.     Heart sounds: Normal heart sounds.  Pulmonary:     Effort: Pulmonary effort is normal.     Breath sounds: Normal breath sounds.  Abdominal:     General: Abdomen is flat. Bowel sounds are normal. There is no distension.     Palpations: Abdomen is soft.     Tenderness: There is no abdominal tenderness.  Musculoskeletal:        General: No swelling.      Cervical back: Neck supple.  Lymphadenopathy:     Cervical: No cervical adenopathy.  Skin:    General: Skin is warm and dry.     Findings: No rash.  Neurological:     General: No focal deficit present.     Mental Status: She is alert.  Psychiatric:        Mood and Affect: Mood normal.        Behavior: Behavior normal.     LABORATORY DATA:  CBC    Component Value Date/Time   WBC 4.8 12/25/2021 0842   WBC 18.1 (H) 12/09/2021 1526   RBC 3.39 (L) 12/25/2021 0842   HGB 10.1 (L) 12/25/2021 0842   HGB 7.6 (L) 06/11/2021 1458   HGB 9.9 (L) 05/02/2014 1200   HCT 31.9 (L) 12/25/2021 0842   HCT 23.5 (L) 06/11/2021 1458   HCT 30.8 (L) 05/02/2014 1200   PLT 68 (L) 12/25/2021 0842   PLT 99 (LL) 06/11/2021 1458   MCV 94.1 12/25/2021 0842   MCV 91 06/11/2021 1458   MCV 96.3 05/02/2014 1200   MCH 29.8 12/25/2021 0842   MCHC 31.7 12/25/2021 0842   RDW 16.4 (H) 12/25/2021 0842   RDW 13.3 06/11/2021 1458   RDW 14.2 05/02/2014 1200   LYMPHSABS 1.4 12/25/2021 0842   LYMPHSABS 1.9 05/02/2014 1200   MONOABS 0.6 12/25/2021 0842   MONOABS 0.5 05/02/2014 1200   EOSABS 0.4 12/25/2021 0842   EOSABS 0.2  05/02/2014 1200   EOSABS 0.4 03/05/2010 0857   BASOSABS 0.1 12/25/2021 0842   BASOSABS 0.0 05/02/2014 1200    CMP     Component Value Date/Time   NA 135 12/25/2021 0842   NA 139 01/13/2014 0951   K 4.4 12/25/2021 0842   K 3.6 01/13/2014 0951   CL 103 12/25/2021 0842   CL 106 10/22/2012 1005   CO2 29 12/25/2021 0842   CO2 21 (L) 01/13/2014 0951   GLUCOSE 100 (H) 12/25/2021 0842   GLUCOSE 95 01/13/2014 0951   GLUCOSE 95 10/22/2012 1005   BUN 32 (H) 12/25/2021 0842   BUN 14.6 01/13/2014 0951   CREATININE 1.27 (H) 12/25/2021 0842   CREATININE 1.0 01/13/2014 0951   CALCIUM 8.9 12/25/2021 0842   CALCIUM 8.9 01/13/2014 0951   PROT 8.2 (H) 12/25/2021 0842   PROT 8.1 01/13/2014 0951   ALBUMIN 3.3 (L) 12/25/2021 0842   ALBUMIN 3.4 (L) 01/13/2014 0951   AST 26 12/25/2021 0842   AST  19 01/13/2014 0951   ALT 18 12/25/2021 0842   ALT 20 01/13/2014 0951   ALKPHOS 149 (H) 12/25/2021 0842   ALKPHOS 85 01/13/2014 0951   BILITOT 0.7 12/25/2021 0842   BILITOT 0.26 01/13/2014 0951   GFRNONAA 45 (L) 12/25/2021 0842   GFRAA 59 (L) 12/08/2018 2031      ASSESSMENT and THERAPY PLAN:   Anemia of chronic disease Margia is here for follow-up of her anemia of chronic disease.  Her most recent ferritin was above 100.  Her hemoglobin remains stable.    I reached out to Dr. Carlean Purl to f/u about her TPN f/u and forwarded him her lab results.    I reviewed the CVA issue with Dr. Lindi Adie, who does not think it is related to the Aranesp as Emberlin's hemoglobin has remained consistently below 11.  We lowered her threshold for Aranesp to only administer it when her hemoglobin is 10.5 or less.    Stroke [facility]) Her CVA on MRI appears worse.  I spoke with Dr. Mickeal Skinner, our neuro oncologist who recommends that she undergo MRA head and neck followed by f/u with him afterwards.    I sent Dr. Clayborne Dana colleague, [name] Clegg notification of the worsening CVA so that she can alert Dr. Haroldine Laws to ensure he wants to proceed with cardiac catheterization on 8/23 since it will require her to stop her xarelto.      All questions were answered. The patient knows to call the clinic with any problems, questions or concerns. We can certainly see the patient much sooner if necessary.  Total encounter time:50 minutes*in face-to-face visit time, chart review, lab review, care coordination, order entry, and documentation of the encounter time.    Wilber Bihari, NP 12/27/21 2:03 AM Medical Oncology and Hematology [facility] [place], [place] [address] Tel. [phone]    Fax. [phone]  *Total Encounter Time as defined by the Centers for Medicare and Medicaid Services includes, in addition to the face-to-face time of a patient visit (documented in the note above)  non-face-to-face time: obtaining and reviewing outside history, ordering and reviewing medications, tests or procedures, care coordination (communications with other health care professionals or caregivers) and documentation in the medical record.

## 2021-12-25 NOTE — Progress Notes (Signed)
Pt has been having dizziness for about 1 year.  She has seen an ear, nose, and throat MD and has had an MRI, but is awaiting those results.  The pt is scheduled for a heart catheterization on 01/01/22, and she is also scheduled to see Dr. Burt Knack about a possible valve repair.  Wilber Bihari, NP, will be notified of these upcoming procedures.

## 2021-12-26 ENCOUNTER — Inpatient Hospital Stay: Payer: Medicare Other

## 2021-12-26 ENCOUNTER — Inpatient Hospital Stay: Payer: Medicare Other | Admitting: Hematology and Oncology

## 2021-12-26 ENCOUNTER — Telehealth: Payer: Self-pay | Admitting: Internal Medicine

## 2021-12-26 ENCOUNTER — Encounter: Payer: Self-pay | Admitting: Internal Medicine

## 2021-12-26 NOTE — Telephone Encounter (Signed)
Spoke to [place] and the dietitian from the TPN team.  It is possible to reduce [name] [name]'s volume in her TPN but they would have to make adjustments in the acetate concentration and that could have implications with her biochemical parameters.  She is drinking water throughout the day without quantification and they have asked her to do that.  I think that is a good idea.  We will see what is found out at her upcoming heart catheterization and try to make adjustments as possible.  I tried to reach [place] and review but was unable.  I will call back at some point.

## 2021-12-27 ENCOUNTER — Encounter: Payer: Self-pay | Admitting: Hematology and Oncology

## 2021-12-27 ENCOUNTER — Other Ambulatory Visit: Payer: Self-pay | Admitting: Internal Medicine

## 2021-12-27 NOTE — Assessment & Plan Note (Signed)
Her CVA on MRI appears worse.  I spoke with Dr. Mickeal Skinner, our neuro oncologist who recommends that she undergo MRA head and neck followed by f/u with him afterwards.    I sent Dr. Clayborne Dana colleague, [name] Clegg notification of the worsening CVA so that she can alert Dr. Haroldine Laws to ensure he wants to proceed with cardiac catheterization on 8/23 since it will require her to stop her xarelto.

## 2021-12-27 NOTE — Assessment & Plan Note (Addendum)
[name] [name] is here for follow-up of her anemia of chronic disease.  Her most recent ferritin was above 100.  Her hemoglobin remains stable.    I reached out to Dr. Carlean Purl to f/u about her TPN f/u and forwarded him her lab results.    I reviewed the CVA issue with Dr. Lindi Adie, who does not think it is related to the Aranesp as [name]'s hemoglobin has remained consistently below 11.  We lowered her threshold for Aranesp to only administer it when her hemoglobin is 10.5 or less.

## 2021-12-30 ENCOUNTER — Other Ambulatory Visit: Payer: Self-pay | Admitting: Adult Health

## 2021-12-30 NOTE — Telephone Encounter (Signed)
Communication with Dr. Marvell Fuller did not think adjusting the volume would make that much of a difference.  We will see what her catheterization tells [place].

## 2021-12-31 ENCOUNTER — Other Ambulatory Visit: Payer: Self-pay | Admitting: Hematology and Oncology

## 2022-01-01 ENCOUNTER — Ambulatory Visit (HOSPITAL_COMMUNITY)
Admission: RE | Admit: 2022-01-01 | Discharge: 2022-01-01 | Disposition: A | Discharge status: Home / Self Care | Payer: Medicare Other | Attending: Internal Medicine | Admitting: Internal Medicine

## 2022-01-01 ENCOUNTER — Other Ambulatory Visit: Payer: Self-pay

## 2022-01-01 ENCOUNTER — Encounter (HOSPITAL_COMMUNITY)
Admission: RE | Disposition: A | Discharge status: Home / Self Care | Payer: Self-pay | Source: Home / Self Care | Attending: Internal Medicine

## 2022-01-01 ENCOUNTER — Other Ambulatory Visit: Payer: Self-pay | Admitting: *Deleted

## 2022-01-01 DIAGNOSIS — Z79899 Other long term (current) drug therapy: Secondary | ICD-10-CM | POA: Diagnosis not present

## 2022-01-01 DIAGNOSIS — I2510 Atherosclerotic heart disease of native coronary artery without angina pectoris: Secondary | ICD-10-CM | POA: Diagnosis not present

## 2022-01-01 DIAGNOSIS — D6859 Other primary thrombophilia: Secondary | ICD-10-CM | POA: Insufficient documentation

## 2022-01-01 DIAGNOSIS — I447 Left bundle-branch block, unspecified: Secondary | ICD-10-CM | POA: Insufficient documentation

## 2022-01-01 DIAGNOSIS — I340 Nonrheumatic mitral (valve) insufficiency: Secondary | ICD-10-CM | POA: Insufficient documentation

## 2022-01-01 DIAGNOSIS — I255 Ischemic cardiomyopathy: Secondary | ICD-10-CM | POA: Insufficient documentation

## 2022-01-01 DIAGNOSIS — I110 Hypertensive heart disease with heart failure: Secondary | ICD-10-CM | POA: Diagnosis not present

## 2022-01-01 DIAGNOSIS — K912 Postsurgical malabsorption, not elsewhere classified: Secondary | ICD-10-CM | POA: Insufficient documentation

## 2022-01-01 DIAGNOSIS — Z8673 Personal history of transient ischemic attack (TIA), and cerebral infarction without residual deficits: Secondary | ICD-10-CM | POA: Diagnosis not present

## 2022-01-01 DIAGNOSIS — I272 Pulmonary hypertension, unspecified: Secondary | ICD-10-CM | POA: Insufficient documentation

## 2022-01-01 DIAGNOSIS — I5020 Unspecified systolic (congestive) heart failure: Secondary | ICD-10-CM | POA: Diagnosis not present

## 2022-01-01 DIAGNOSIS — Z7901 Long term (current) use of anticoagulants: Secondary | ICD-10-CM | POA: Diagnosis not present

## 2022-01-01 DIAGNOSIS — I5022 Chronic systolic (congestive) heart failure: Secondary | ICD-10-CM

## 2022-01-01 HISTORY — PX: RIGHT HEART CATH: CATH118263

## 2022-01-01 LAB — BASIC METABOLIC PANEL
Anion gap: 6 (ref 5–15)
BUN: 24 mg/dL — ABNORMAL HIGH (ref 8–23)
CO2: 25 mmol/L (ref 22–32)
Calcium: 8.6 mg/dL — ABNORMAL LOW (ref 8.9–10.3)
Chloride: 107 mmol/L (ref 98–111)
Creatinine, Ser: 1.32 mg/dL — ABNORMAL HIGH (ref 0.44–1.00)
GFR, Estimated: 43 mL/min — ABNORMAL LOW (ref >60)
Glucose, Bld: 86 mg/dL (ref 70–99)
Potassium: 3.7 mmol/L (ref 3.5–5.1)
Sodium: 138 mmol/L (ref 135–145)

## 2022-01-01 LAB — POCT I-STAT EG7
Acid-base deficit: 1 mmol/L (ref 0.0–2.0)
Acid-base deficit: 2 mmol/L (ref 0.0–2.0)
Bicarbonate: 23.1 mmol/L (ref 20.0–28.0)
Bicarbonate: 23.9 mmol/L (ref 20.0–28.0)
Calcium, Ion: 1.15 mmol/L (ref 1.15–1.40)
Calcium, Ion: 1.23 mmol/L (ref 1.15–1.40)
HCT: 32 % — ABNORMAL LOW (ref 36.0–46.0)
HCT: 33 % — ABNORMAL LOW (ref 36.0–46.0)
Hemoglobin: 10.9 g/dL — ABNORMAL LOW (ref 12.0–15.0)
Hemoglobin: 11.2 g/dL — ABNORMAL LOW (ref 12.0–15.0)
O2 Saturation: 61 %
O2 Saturation: 62 %
Potassium: 3.5 mmol/L (ref 3.5–5.1)
Potassium: 3.7 mmol/L (ref 3.5–5.1)
Sodium: 141 mmol/L (ref 135–145)
Sodium: 143 mmol/L (ref 135–145)
TCO2: 24 mmol/L (ref 22–32)
TCO2: 25 mmol/L (ref 22–32)
pCO2, Ven: 38.4 mmHg — ABNORMAL LOW (ref 44–60)
pCO2, Ven: 39.6 mmHg — ABNORMAL LOW (ref 44–60)
pH, Ven: 7.388 (ref 7.25–7.43)
pH, Ven: 7.389 (ref 7.25–7.43)
pO2, Ven: 32 mmHg (ref 32–45)
pO2, Ven: 32 mmHg (ref 32–45)

## 2022-01-01 SURGERY — RIGHT HEART CATH
Anesthesia: LOCAL

## 2022-01-01 MED ORDER — LABETALOL HCL 5 MG/ML IV SOLN: 10.0000 mg | INTRAVENOUS | Status: DC | PRN

## 2022-01-01 MED ORDER — HEPARIN (PORCINE) IN NACL 1000-0.9 UT/500ML-% IV SOLN
INTRAVENOUS | Status: DC | PRN
  Administered 2022-01-01: 500 mL

## 2022-01-01 MED ORDER — SODIUM CHLORIDE 0.9% FLUSH: 3.0000 mL | Freq: Two times a day (BID) | INTRAVENOUS | Status: DC

## 2022-01-01 MED ORDER — LIDOCAINE HCL (PF) 1 % IJ SOLN
INTRAMUSCULAR | Status: AC
  Filled 2022-01-01: qty 30

## 2022-01-01 MED ORDER — HYDRALAZINE HCL 20 MG/ML IJ SOLN: 10.0000 mg | INTRAMUSCULAR | Status: DC | PRN

## 2022-01-01 MED ORDER — SODIUM CHLORIDE 0.9 % IV SOLN: 250.0000 mL | INTRAVENOUS | Status: DC | PRN

## 2022-01-01 MED ORDER — HEPARIN (PORCINE) IN NACL 1000-0.9 UT/500ML-% IV SOLN
INTRAVENOUS | Status: AC
  Filled 2022-01-01: qty 500

## 2022-01-01 MED ORDER — DIAZEPAM 2 MG PO TABS: 2.0000 mg | ORAL_TABLET | Freq: Four times a day (QID) | ORAL | 0 refills | Status: DC | PRN

## 2022-01-01 MED ORDER — SODIUM CHLORIDE 0.9% FLUSH: 3.0000 mL | INTRAVENOUS | Status: DC | PRN

## 2022-01-01 MED ORDER — ONDANSETRON HCL 4 MG/2ML IJ SOLN: 4.0000 mg | Freq: Four times a day (QID) | INTRAMUSCULAR | Status: DC | PRN

## 2022-01-01 MED ORDER — ACETAMINOPHEN 325 MG PO TABS
650.0000 mg | ORAL_TABLET | ORAL | Status: DC | PRN
Start: 2022-01-01 — End: 2022-01-01

## 2022-01-01 MED ORDER — LIDOCAINE HCL (PF) 1 % IJ SOLN
INTRAMUSCULAR | Status: DC | PRN
  Administered 2022-01-01: 15 mL

## 2022-01-01 MED ORDER — SODIUM CHLORIDE 0.9 % IV SOLN: INTRAVENOUS | Status: DC

## 2022-01-01 SURGICAL SUPPLY — 8 items
CATH SWAN GANZ 7F STRAIGHT (CATHETERS) IMPLANT
PACK CARDIAC CATHETERIZATION (CUSTOM PROCEDURE TRAY) ×1 IMPLANT
PROTECTION STATION PRESSURIZED (MISCELLANEOUS) ×1
SHEATH PINNACLE 7F 10CM (SHEATH) IMPLANT
SHEATH PROBE COVER 6X72 (BAG) IMPLANT
STATION PROTECTION PRESSURIZED (MISCELLANEOUS) IMPLANT
TRANSDUCER W/STOPCOCK (MISCELLANEOUS) ×1 IMPLANT
WIRE EMERALD 3MM-J .025X260CM (WIRE) IMPLANT

## 2022-01-01 NOTE — Interval H&P Note (Signed)
History and Physical Interval Note:  01/01/2022 12:03 PM  [name] [name]  has presented today for surgery, with the diagnosis of hf and severe MR  The various methods of treatment have been discussed with the patient and family. After consideration of risks, benefits and other options for treatment, the patient has consented to  Procedure(s): RIGHT HEART CATH (N/A) as a surgical intervention.  The patient's history has been reviewed, patient examined, no change in status, stable for surgery.  I have reviewed the patient's chart and labs.  Questions were answered to the patient's satisfaction.     Lache Dagher

## 2022-01-01 NOTE — Progress Notes (Signed)
Pt called requesting NP sent in prescription for Valium 2 mg p.o to take prior to upcoming MRI for claustrophobia.  Verbal orders received and RN called order in to pharmacy on file.

## 2022-01-01 NOTE — Progress Notes (Signed)
Site area: Right groin a 7 french venous sheath was removed  Site Prior to Removal:  Level 0  Pressure Applied For 15 MINUTES    Bedrest Beginning at 1305 X 1 hour   Manual:   Yes.    Patient Status During Pull:  stable  Post Pull Groin Site:  Level 0  Post Pull Instructions Given:  Yes.    Post Pull Pulses Present:  Yes.    Dressing Applied:  Yes.    Comments:

## 2022-01-02 ENCOUNTER — Encounter (HOSPITAL_COMMUNITY): Payer: Self-pay | Admitting: Internal Medicine

## 2022-01-03 ENCOUNTER — Encounter: Payer: Self-pay | Admitting: Cardiovascular Disease

## 2022-01-03 ENCOUNTER — Ambulatory Visit (INDEPENDENT_AMBULATORY_CARE_PROVIDER_SITE_OTHER): Payer: Medicare Other | Admitting: Cardiovascular Disease

## 2022-01-03 VITALS — BP 108/50 | HR 53 | Ht 68.0 in | Wt 141.8 lb

## 2022-01-03 DIAGNOSIS — I340 Nonrheumatic mitral (valve) insufficiency: Secondary | ICD-10-CM

## 2022-01-03 DIAGNOSIS — I6523 Occlusion and stenosis of bilateral carotid arteries: Secondary | ICD-10-CM | POA: Diagnosis not present

## 2022-01-03 NOTE — H&P (View-Only) (Signed)
Cardiology Office Note:    Date:  01/03/2022   ID:  [name] [name], DOB Aug 07, 1950, MRN [id number]  PCP:  [name], MD   [place] Providers Cardiologist:  None     Referring MD: [name], MD   Chief Complaint  Patient presents with   Shortness of Breath    History of Present Illness:    [name] [name] is a 71 y.o. female presenting for evaluation of severe mitral regurgitation.  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  She has been followed by Dr. Johnsie Cancel with presumed ischemic cardiomyopathy LVEF approximately 45% over time.  She underwent cardiac catheterization in 2022 showing mild coronary artery disease and ejection fraction 35 to 40% with inferoapical aneurysm.  The patient also has a history of stroke with left-sided weakness in 2022 and an MRI of the brain showed numerous acute cortical and subcortical infarcts within the right cerebral hemisphere in the right MCA territory.  She has a hypercoagulable disorder with multiple upper extremity venous occlusions and is managed with chronic oral anticoagulation.  Her femoral veins have been shown to be patent.  She recently has developed progressive symptoms of heart failure and has been seen by the advanced heart failure team.  She is noted to have progressive and now severe mitral regurgitation and is referred for consideration of transcatheter edge-to-edge repair of the mitral valve.  The patient is here with her husband and daughter today.  She remains functionally independent and remarkably has been on TPN for greater than 15 years.  She ambulates with a cane but gets around pretty well.  She recently had problems with progressive edema, shortness of breath, and orthopnea.  Diuretics were adjusted and she is starting to feel better.  She underwent a right heart catheterization demonstrating elevated left heart diastolic filling pressures and prominent V  waves in the pulmonary wedge tracing suggestive of significant mitral regurgitation.  She has not had recent problems with chest pain or pressure.  She denies lightheadedness or syncope.  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[place]) 05/25/2018   Carotid stenosis    Carotid US (9/15):  R 40-59%; L 1-39% >> FU 1 year   Cataract    Closed left subtrochanteric femur fracture ([place]) 12/31/2017   Clotting disorder ([place])    Congestive heart failure (CHF) (HCC)    EF 25-30%   COVID-19 2022   Deep venous thrombosis ([place]) left subclavian vein 07/31/2017   Fever 10/29/2020   Fracture of left clavicle    GERD (gastroesophageal reflux disease)    History of blood transfusion 2013   anemia   Hx of cardiovascular stress test    Myoview (9/15):  inf-apical scar; no ischemia; EF 47% - low risk    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 12/12/2011   Osteoporosis    Pancytopenia 10/07/2011   Pathologic fracture of neck of femur ([place])    Personal history of colonic polyps    Renal insufficiency    hx of yrs ago   Serratia marcescens infection - bactermia assoc w/ PICC  01/18/2015   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HCC)    Splenomegaly    By ultrasound   Stroke ([place]) 07/2020   Thrombophilia ([place])    Vitamin D deficiency     Past Surgical History:  Procedure Laterality Date   APPENDECTOMY  yrs ago   CHOLECYSTECTOMY  yrs ago   COLONOSCOPY  12/05/2005   internal hemorrhoids (for polyp surveillance)   COLONOSCOPY  04/26/2012   Procedure: COLONOSCOPY;  Surgeon: Gatha Mayer, MD;  Location: [facility];  Service: Endoscopy;  Laterality: N/A;    COLONOSCOPY WITH PROPOFOL N/A 03/18/2016   Procedure: COLONOSCOPY WITH PROPOFOL;  Surgeon: Gatha Mayer, MD;  Location: [facility];  Service: Endoscopy;  Laterality: N/A;   ESOPHAGOGASTRODUODENOSCOPY  01/22/2009   erosive esophagitis   HARDWARE REMOVAL Left 12/31/2017   Procedure: HARDWARE REMOVAL;  Surgeon: Rod Can, MD;  Location: [facility];  Service: Orthopedics;  Laterality: Left;   INTRAMEDULLARY (IM) NAIL INTERTROCHANTERIC Left 12/31/2017   Procedure: INTRAMEDULLARY (IM) NAIL SUBTROCHANTRIC;  Surgeon: Rod Can, MD;  Location: [facility];  Service: Orthopedics;  Laterality: Left;   IR CV LINE INJECTION  03/23/2018   IR CV LINE INJECTION  10/02/2021   IR FLUORO GUIDE CV LINE LEFT  01/01/2018   IR FLUORO GUIDE CV LINE LEFT  03/24/2018   IR FLUORO GUIDE CV LINE LEFT  05/21/2018   IR FLUORO GUIDE CV LINE LEFT  07/09/2018   IR FLUORO GUIDE CV LINE LEFT  12/28/2020   IR FLUORO GUIDE CV LINE LEFT  07/02/2021   IR FLUORO GUIDE CV LINE RIGHT  11/01/2020   IR GENERIC HISTORICAL  07/04/2016   IR REMOVAL TUN CV CATH W/O FL 07/04/2016 Ascencion Dike, PA-C [facility]   IR GENERIC HISTORICAL  07/10/2016   IR US GUIDE VASC ACCESS LEFT 07/10/2016 Arne Cleveland, MD [facility]   IR GENERIC HISTORICAL  07/10/2016   IR FLUORO GUIDE CV LINE LEFT 07/10/2016 Arne Cleveland, MD [facility]   IR PATIENT EVAL TECH 0-60 MINS  10/07/2017   IR PATIENT EVAL TECH 0-60 MINS  03/09/2018   IR RADIOLOGIST EVAL & MGMT  07/21/2017   IR RADIOLOGIST EVAL & MGMT  11/14/2020   IR RADIOLOGIST EVAL & MGMT  11/27/2020   IR RADIOLOGIST EVAL & MGMT  01/24/2021   IR REMOVAL TUN CV CATH W/O FL  12/28/2020   IR TRANSCATH PLC STENT  INITIAL VEIN  INC ANGIOPLASTY  12/28/2020   IR US GUIDE VASC ACCESS LEFT  11/01/2020   IR US GUIDE VASC ACCESS LEFT  12/28/2020   IR US GUIDE VASC ACCESS LEFT  12/28/2020   IR US GUIDE VASC ACCESS LEFT  12/28/2020   IR US GUIDE VASC ACCESS RIGHT  11/01/2020   IR VENO/EXT/UNI LEFT  11/01/2020   IR VENOCAVAGRAM  SVC  12/28/2020   LEFT HEART CATH AND CORONARY ANGIOGRAPHY N/A 06/26/2020   Procedure: LEFT HEART CATH AND CORONARY ANGIOGRAPHY;  Surgeon: Jolaine Artist, MD;  Location: [place] [facility];  Service: Cardiovascular;  Laterality: N/A;   ORIF PROXIMAL FEMORAL FRACTURE W/ ITST NAIL SYSTEM  03/2007   left, Dr. Shellia Carwin   RIGHT HEART CATH N/A 01/01/2022   Procedure: RIGHT HEART CATH;  Surgeon: Jolaine Artist, MD;  Location: [place] [facility];  Service: Cardiovascular;  Laterality: N/A;   SMALL INTESTINE SURGERY  2005   multiple with right colon resection for ischemia/infarct    Current Medications:  Current Meds  Medication Sig   acetaminophen (TYLENOL) 500 MG tablet Take 1,000 mg by mouth every 6 (six) hours as needed for mild pain.   ADULT TPN Inject 1,800 mLs into the vein 4 (four) times a week. Pt receives home TPN from [facility]:  1800 mL bag, four nights weekly (Monday, Tuesday, Wednesday, Thursday for 8 hours (includes 1 hour taper up and down).   Calcium Carb-Cholecalciferol (CALCIUM + D3 PO) Take 1 tablet by mouth in the morning and at bedtime. Morning & early evening.   cetirizine (ZYRTEC) 10 MG tablet Take 10 mg by mouth daily as needed for allergies.   clobetasol ointment (TEMOVATE) 9.21 % Apply 1 application  topically 2 (two) times daily as needed (irritation).   diazepam (VALIUM) 2 MG tablet Take 1 tablet (2 mg total) by mouth every 6 (six) hours as needed for anxiety. Take one tablet one hour prior to scan for claustrophobia.   diphenhydrAMINE (BENADRYL) 25 mg capsule Take 25 mg by mouth every 6 (six) hours as needed for itching or sleep.   empagliflozin (JARDIANCE) 10 MG TABS tablet Take 1 tablet (10 mg total) by mouth daily before breakfast.   ergocalciferol (DRISDOL) 200 MCG/ML drops Take 6 mLs (48,000 Units total) by mouth 3 (three) times a week.   furosemide (LASIX) 40 MG tablet Take 2 tablets (80 mg total) by mouth daily. (Patient taking differently: Take 40 mg by  mouth 2 (two) times daily. Morning & early afternoon.)   Heparin Sodium, Porcine, (HEPARIN LOCK FLUSH IJ) Inject 5 mLs as directed 4 (four) times a week. Monday, Tuesday, Wednesday, Thursday in the morning with TPN   losartan (COZAAR) 25 MG tablet Take 1 tablet (25 mg total) by mouth at bedtime.   omeprazole (PRILOSEC) 40 MG capsule TAKE 1 CAPSULE BY MOUTH EVERY DAY BEFORE BREAKFAST   ondansetron (ZOFRAN-ODT) 8 MG disintegrating tablet TAKE 1 TABLET BY MOUTH EVERY 8 HOURS AS NEEDED FOR NAUSEA OR VOMITING.   Pediatric Multivit-Minerals (FLINTSTONES GUMMIES PO) Take 4 tablets by mouth in the morning.   potassium chloride SA (KLOR-CON M20) 20 MEQ tablet Take 1 tablet (20 mEq total) by mouth daily.   sodium chloride 0.9 % infusion Inject 900 mLs into the vein every 14 (fourteen) days. Uses for TPN   spironolactone (ALDACTONE) 25 MG tablet TAKE 1 TABLET (25 MG TOTAL) BY MOUTH DAILY. (Patient taking differently: Take 12.5 mg by mouth in the morning.)   Teduglutide, rDNA, 5 MG KIT Inject 3.31 Units into the skin every morning.   triamcinolone cream (KENALOG) 0.1 % Apply 1 application topically as needed (itching).   vitamin E 1000 UNIT capsule Take 1,000 Units by mouth in the morning.   XARELTO 20 MG TABS tablet TAKE 1 TABLET BY MOUTH DAILY WITH SUPPER.     Allergies:   Ivp dye [iodinated contrast media]   Social History   Socioeconomic History   Marital status: Married    Spouse name: Not on file   Number of children: 1   Years of education: Not on file   Highest education level: Not on file  Occupational History   Not on file  Tobacco Use   Smoking status: Never   Smokeless tobacco: Never  Vaping Use   Vaping Use: Never used  Substance and Sexual Activity   Alcohol use: Never   Drug use: Never   Sexual activity: Yes    Comment: 1st intercourse 35 yo-5 partners  Other Topics Concern   Not on  file  Social History Narrative   ** Merged History Encounter **       Married to Molly Maduro,  has 1 daughter and a granddaughter the patient's son died in childhood due to a motor vehicle wreck 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 No alcohol tobacco or drug use 04/07/2017    Social Determinants of Health   Financial Resource Strain: Medium Risk (12/30/2017)   Overall Financial Resource Strain (CARDIA)    Difficulty of Paying Living Expenses: Somewhat hard  Food Insecurity: No Food Insecurity (12/30/2017)   Hunger Vital Sign    Worried About Running Out of Food in the Last Year: Never true    Ran Out of Food in the Last Year: Never true  Transportation Needs: No Transportation Needs (04/13/2020)   PRAPARE - Administrator, Civil Service (Medical): No    Lack of Transportation (Non-Medical): No  Physical Activity: Insufficiently Active (12/30/2017)   Exercise Vital Sign    Days of Exercise per Week: 7 days    Minutes of Exercise per Session: 20 min  Stress: No Stress Concern Present (12/30/2017)   [employer] of Occupational Health - Occupational Stress Questionnaire    Feeling of Stress : Not at all  Social Connections: Moderately Integrated (12/30/2017)   Social Connection and Isolation Panel [NHANES]    Frequency of Communication with Friends and Family: More than three times a week    Frequency of Social Gatherings with Friends and Family: Three times a week    Attends Religious Services: 1 to 4 times per year    Active Member of Clubs or Organizations: No    Attends Banker Meetings: Never    Marital Status: Married     Family History: The patient's family history includes AAA (abdominal aortic aneurysm) in her mother; Diabetes in her mother; Hypertension in her mother. There is no history of Colon cancer or Stomach cancer.  ROS:   Please see the history of present illness.    All other systems reviewed and are negative.  EKGs/Labs/Other Studies Reviewed:    The following studies were reviewed  today: Right Heart Cath 01/01/2022: Findings:   RA = 9 RV = 58/10 PA = 56/23 (37) R PCW = 27 (v = 39) L PCW = 25 (v = 31) Fick cardiac output/index = 4.8/2.7 Thermo CO/CI = 4.7/2.7 PVR = 2.10 WU Ao sat = 98% PA sat = 62%, 61%   Assessment: 1. Elevated left-sided filling pressures with mild to moderate pulmonary venous HTN and normal output 2. Prominent v-waves in PCWP tracing suggestive of significant MR   Plan/Discussion:   Increase diuretics. F/u with Structural Heart team re: mTEER. Will need TEE as well.   2d Echo 12/16/2021: IMPRESSIONS     1. Left ventricular ejection fraction, by estimation, is 35 to 40%. The  left ventricle has moderately decreased function. The left ventricle  demonstrates global hypokinesis. The left ventricular internal cavity size  was moderately to severely dilated.  There is mild asymmetric left ventricular hypertrophy of the basal-septal  segment. Left ventricular diastolic parameters are consistent with Grade  II diastolic dysfunction (pseudonormalization). There is akinesis of the  left ventricular, mid-apical  inferior wall, apical segment and inferolateral wall.   2. Right ventricular systolic function is normal. The right ventricular  size is normal.   3. Left atrial size was severely dilated.   4. Right atrial size was mild to moderately dilated.  5. The mitral valve is abnormal. Severe mitral valve regurgitation.   6. The aortic valve is tricuspid. There is moderate calcification of the  aortic valve. Aortic valve regurgitation is mild to moderate. Mild aortic  valve stenosis.   Recent Labs: 06/30/2021: TSH 2.195 12/09/2021: B Natriuretic Peptide 3,875.2 12/25/2021: ALT 18; Magnesium 2.2; Platelet Count 68 01/01/2022: BUN 24; Creatinine, Ser 1.32; Hemoglobin 11.2; Potassium 3.7; Sodium 141  Recent Lipid Panel    Component Value Date/Time   CHOL 72 07/24/2020 0501   TRIG 57 12/25/2021 0841   HDL 17 (L) 07/24/2020 0501   CHOLHDL  4.2 07/24/2020 0501   VLDL 35 07/24/2020 0501   LDLCALC 20 07/24/2020 0501     Risk Assessment/Calculations:                Physical Exam:    VS:  BP (!) 108/50   Pulse (!) 53   Ht $R'5\' 8"'II$  (1.727 m)   Wt 141 lb 12.8 oz (64.3 kg)   LMP 02/26/2014   SpO2 99%   BMI 21.56 kg/m     Wt Readings from Last 3 Encounters:  01/03/22 141 lb 12.8 oz (64.3 kg)  01/01/22 143 lb (64.9 kg)  12/25/21 145 lb 9.6 oz (66 kg)     GEN:  Well nourished, well developed in no acute distress HEENT: Normal NECK: No JVD; No carotid bruits LYMPHATICS: No lymphadenopathy CARDIAC: RRR, soft systolic murmur at the left lower sternal border RESPIRATORY:  Clear to auscultation without rales, wheezing or rhonchi  ABDOMEN: Soft, non-tender, non-distended MUSCULOSKELETAL: Trace bilateral pretibial edema; No deformity  SKIN: Warm and dry NEUROLOGIC:  Alert and oriented x 3 PSYCHIATRIC:  Normal affect   ASSESSMENT:    1. Nonrheumatic mitral valve regurgitation    PLAN:    In order of problems listed above:  71 year old woman with ischemic cardiomyopathy and moderate LV dysfunction with inferoapical scar and aneurysm and absence of obstructive CAD has developed progressive symptoms of heart failure with NYHA functional class III symptoms and also progressive now severe mitral regurgitation.  Comorbid issues include chronic TPN with multiple upper extremity and central venous occlusions associated with long-term catheter use.  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  I personally reviewed her echo images today.  I compared her recent echo to 1 from January 2023.  She has moderate segmental LV dysfunction as described above.  RV function appears to be normal.  There is severe central mitral regurgitation with marked change compared to her  previous echo study.  Her mitral valve leaflets appear thickened and she may have inflammatory mitral valve disease.  She does not appear to have any mitral stenosis and I think she may be a candidate for transcatheter edge-to-edge repair of the mitral valve.  I discussed the natural history of mitral regurgitation with its association with chronic systolic heart failure today.  She is clinically improved with medical therapy but is at high risk of further decompensation with this degree of mitral regurgitation.  I have recommended a transesophageal echocardiogram to better define the functional anatomy of her mitral regurgitation and assess whether she might be a candidate for transcatheter edge-to-edge repair of the mitral valve.  I reviewed the risks, indications, and alternatives to transesophageal echocardiography.  The patient understands the risk of esophageal injury or significant complication are extremely low.  She agrees to proceed.  Once her TEE is completed, we will review her case with our multidisciplinary heart valve team and assess treatment options further.  As part of her evaluation today, I demonstrated a procedural animation of the MitraClip procedure and explained each of the steps of the procedure, expected postoperative course, and again reviewed potential benefit of the procedure.  All of her questions were answered and they will proceed with transesophageal echo followed by valve team review.      Shared Decision Making/Informed Consent The risks [esophageal damage, perforation (1:10,000 risk), bleeding, pharyngeal hematoma as well as other potential complications associated with conscious sedation including aspiration, arrhythmia, respiratory failure and death], benefits (treatment guidance and diagnostic support) and alternatives of a transesophageal echocardiogram were discussed in detail with Ms. Suit and she is willing to proceed.     Medication Adjustments/Labs and Tests  Ordered: Current medicines are reviewed at length with the patient today.  Concerns regarding medicines are outlined above.  No orders of the defined types were placed in this encounter.  No orders of the defined types were placed in this encounter.   Patient Instructions  Medication Instructions:  Your physician recommends that you continue on your current medications as directed. Please refer to the Current Medication list given to you today.  *If you need a refill on your cardiac medications before your next appointment, please call your pharmacy*   Lab Work: NONE If you have labs (blood work) drawn today and your tests are completely normal, you will receive your results only by: [place] (if you have MyChart) OR A paper copy in the mail If you have any lab test that is abnormal or we need to change your treatment, we will call you to review the results.   Testing/Procedures: Your physician has requested that you have a TEE. During a TEE, sound waves are used to create images of your heart. It provides your doctor with information about the size and shape of your heart and how well your heart's chambers and valves are working. In this test, a transducer is attached to the end of a flexible tube that's guided down your throat and into your esophagus (the tube leading from you mouth to your stomach) to get a more detailed image of your heart. You are not awake for the procedure. Please see the instruction sheet given to you today. For further information please visit [email or web address].     Follow-Up: To be determined At [facility], you and your health needs are our priority.  As part of our continuing mission to provide you with exceptional heart care, we have created designated Provider Care Teams.  These Care Teams include your primary Cardiologist (physician) and Advanced Practice Providers (APPs -  Physician Assistants and Nurse Practitioners) who all work together to  provide you with the care you need, when you need it.    Important Information About Sugar         Signed, [name], MD  01/03/2022 10:16 AM    [place]

## 2022-01-03 NOTE — Progress Notes (Addendum)
Cardiology Office Note:    Date:  01/03/2022   ID:  [name] [name], DOB 1950-06-07, MRN [id number]  PCP:  [name], MD   [facility] Providers Cardiologist:  None     Referring MD: [name], MD   Chief Complaint  Patient presents with   Shortness of Breath    History of Present Illness:    [name] [name] is a 71 y.o. female presenting for evaluation of severe mitral regurgitation.  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  She has been followed by Dr. Johnsie Cancel with presumed ischemic cardiomyopathy LVEF approximately 45% over time.  She underwent cardiac catheterization in 2022 showing mild coronary artery disease and ejection fraction 35 to 40% with inferoapical aneurysm.  The patient also has a history of stroke with left-sided weakness in 2022 and an MRI of the brain showed numerous acute cortical and subcortical infarcts within the right cerebral hemisphere in the right MCA territory.  She has a hypercoagulable disorder with multiple upper extremity venous occlusions and is managed with chronic oral anticoagulation.  Her femoral veins have been shown to be patent.  She recently has developed progressive symptoms of heart failure and has been seen by the advanced heart failure team.  She is noted to have progressive and now severe mitral regurgitation and is referred for consideration of transcatheter edge-to-edge repair of the mitral valve.  The patient is here with her husband and daughter today.  She remains functionally independent and remarkably has been on TPN for greater than 15 years.  She ambulates with a cane but gets around pretty well.  She recently had problems with progressive edema, shortness of breath, and orthopnea.  Diuretics were adjusted and she is starting to feel better.  She underwent a right heart catheterization demonstrating elevated left heart diastolic filling pressures and prominent V  waves in the pulmonary wedge tracing suggestive of significant mitral regurgitation.  She has not had recent problems with chest pain or pressure.  She denies lightheadedness or syncope.  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[place]) 05/25/2018   Carotid stenosis    Carotid US (9/15):  R 40-59%; L 1-39% >> FU 1 year   Cataract    Closed left subtrochanteric femur fracture ([place]) 12/31/2017   Clotting disorder ([place])    Congestive heart failure (CHF) (HCC)    EF 25-30%   COVID-19 2022   Deep venous thrombosis ([place]) left subclavian vein 07/31/2017   Fever 10/29/2020   Fracture of left clavicle    GERD (gastroesophageal reflux disease)    History of blood transfusion 2013   anemia   Hx of cardiovascular stress test    Myoview (9/15):  inf-apical scar; no ischemia; EF 47% - low risk    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 12/12/2011   Osteoporosis    Pancytopenia 10/07/2011   Pathologic fracture of neck of femur ([place])    Personal history of colonic polyps    Renal insufficiency    hx of yrs ago   Serratia marcescens infection - bactermia assoc w/ PICC  01/18/2015   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HCC)    Splenomegaly    By ultrasound   Stroke ([place]) 07/2020   Thrombophilia ([place])    Vitamin D deficiency     Past Surgical History:  Procedure Laterality Date   APPENDECTOMY  yrs ago   CHOLECYSTECTOMY  yrs ago   COLONOSCOPY  12/05/2005   internal hemorrhoids (for polyp surveillance)   COLONOSCOPY  04/26/2012   Procedure: COLONOSCOPY;  Surgeon: Gatha Mayer, MD;  Location: [facility];  Service: Endoscopy;  Laterality: N/A;    COLONOSCOPY WITH PROPOFOL N/A 03/18/2016   Procedure: COLONOSCOPY WITH PROPOFOL;  Surgeon: Gatha Mayer, MD;  Location: [facility];  Service: Endoscopy;  Laterality: N/A;   ESOPHAGOGASTRODUODENOSCOPY  01/22/2009   erosive esophagitis   HARDWARE REMOVAL Left 12/31/2017   Procedure: HARDWARE REMOVAL;  Surgeon: Rod Can, MD;  Location: [facility];  Service: Orthopedics;  Laterality: Left;   INTRAMEDULLARY (IM) NAIL INTERTROCHANTERIC Left 12/31/2017   Procedure: INTRAMEDULLARY (IM) NAIL SUBTROCHANTRIC;  Surgeon: Rod Can, MD;  Location: [facility];  Service: Orthopedics;  Laterality: Left;   IR CV LINE INJECTION  03/23/2018   IR CV LINE INJECTION  10/02/2021   IR FLUORO GUIDE CV LINE LEFT  01/01/2018   IR FLUORO GUIDE CV LINE LEFT  03/24/2018   IR FLUORO GUIDE CV LINE LEFT  05/21/2018   IR FLUORO GUIDE CV LINE LEFT  07/09/2018   IR FLUORO GUIDE CV LINE LEFT  12/28/2020   IR FLUORO GUIDE CV LINE LEFT  07/02/2021   IR FLUORO GUIDE CV LINE RIGHT  11/01/2020   IR GENERIC HISTORICAL  07/04/2016   IR REMOVAL TUN CV CATH W/O FL 07/04/2016 Ascencion Dike, PA-C [facility]   IR GENERIC HISTORICAL  07/10/2016   IR US GUIDE VASC ACCESS LEFT 07/10/2016 Arne Cleveland, MD [facility]   IR GENERIC HISTORICAL  07/10/2016   IR FLUORO GUIDE CV LINE LEFT 07/10/2016 Arne Cleveland, MD [facility]   IR PATIENT EVAL TECH 0-60 MINS  10/07/2017   IR PATIENT EVAL TECH 0-60 MINS  03/09/2018   IR RADIOLOGIST EVAL & MGMT  07/21/2017   IR RADIOLOGIST EVAL & MGMT  11/14/2020   IR RADIOLOGIST EVAL & MGMT  11/27/2020   IR RADIOLOGIST EVAL & MGMT  01/24/2021   IR REMOVAL TUN CV CATH W/O FL  12/28/2020   IR TRANSCATH PLC STENT  INITIAL VEIN  INC ANGIOPLASTY  12/28/2020   IR US GUIDE VASC ACCESS LEFT  11/01/2020   IR US GUIDE VASC ACCESS LEFT  12/28/2020   IR US GUIDE VASC ACCESS LEFT  12/28/2020   IR US GUIDE VASC ACCESS LEFT  12/28/2020   IR US GUIDE VASC ACCESS RIGHT  11/01/2020   IR VENO/EXT/UNI LEFT  11/01/2020   IR VENOCAVAGRAM  SVC  12/28/2020   LEFT HEART CATH AND CORONARY ANGIOGRAPHY N/A 06/26/2020   Procedure: LEFT HEART CATH AND CORONARY ANGIOGRAPHY;  Surgeon: Jolaine Artist, MD;  Location: [facility];  Service: Cardiovascular;  Laterality: N/A;   ORIF PROXIMAL FEMORAL FRACTURE W/ ITST NAIL SYSTEM  03/2007   left, Dr. Shellia Carwin   RIGHT HEART CATH N/A 01/01/2022   Procedure: RIGHT HEART CATH;  Surgeon: Jolaine Artist, MD;  Location: [facility];  Service: Cardiovascular;  Laterality: N/A;   SMALL INTESTINE SURGERY  2005   multiple with right colon resection for ischemia/infarct    Current Medications:  Current Meds  Medication Sig   acetaminophen (TYLENOL) 500 MG tablet Take 1,000 mg by mouth every 6 (six) hours as needed for mild pain.   ADULT TPN Inject 1,800 mLs into the vein 4 (four) times a week. Pt receives home TPN from [facility]:  1800 mL bag, four nights weekly (Monday, Tuesday, Wednesday, Thursday for 8 hours (includes 1 hour taper up and down).   Calcium Carb-Cholecalciferol (CALCIUM + D3 PO) Take 1 tablet by mouth in the morning and at bedtime. Morning & early evening.   cetirizine (ZYRTEC) 10 MG tablet Take 10 mg by mouth daily as needed for allergies.   clobetasol ointment (TEMOVATE) 5.63 % Apply 1 application  topically 2 (two) times daily as needed (irritation).   diazepam (VALIUM) 2 MG tablet Take 1 tablet (2 mg total) by mouth every 6 (six) hours as needed for anxiety. Take one tablet one hour prior to scan for claustrophobia.   diphenhydrAMINE (BENADRYL) 25 mg capsule Take 25 mg by mouth every 6 (six) hours as needed for itching or sleep.   empagliflozin (JARDIANCE) 10 MG TABS tablet Take 1 tablet (10 mg total) by mouth daily before breakfast.   ergocalciferol (DRISDOL) 200 MCG/ML drops Take 6 mLs (48,000 Units total) by mouth 3 (three) times a week.   furosemide (LASIX) 40 MG tablet Take 2 tablets (80 mg total) by mouth daily. (Patient taking differently: Take 40 mg by  mouth 2 (two) times daily. Morning & early afternoon.)   Heparin Sodium, Porcine, (HEPARIN LOCK FLUSH IJ) Inject 5 mLs as directed 4 (four) times a week. Monday, Tuesday, Wednesday, Thursday in the morning with TPN   losartan (COZAAR) 25 MG tablet Take 1 tablet (25 mg total) by mouth at bedtime.   omeprazole (PRILOSEC) 40 MG capsule TAKE 1 CAPSULE BY MOUTH EVERY DAY BEFORE BREAKFAST   ondansetron (ZOFRAN-ODT) 8 MG disintegrating tablet TAKE 1 TABLET BY MOUTH EVERY 8 HOURS AS NEEDED FOR NAUSEA OR VOMITING.   Pediatric Multivit-Minerals (FLINTSTONES GUMMIES PO) Take 4 tablets by mouth in the morning.   potassium chloride SA (KLOR-CON M20) 20 MEQ tablet Take 1 tablet (20 mEq total) by mouth daily.   sodium chloride 0.9 % infusion Inject 900 mLs into the vein every 14 (fourteen) days. Uses for TPN   spironolactone (ALDACTONE) 25 MG tablet TAKE 1 TABLET (25 MG TOTAL) BY MOUTH DAILY. (Patient taking differently: Take 12.5 mg by mouth in the morning.)   Teduglutide, rDNA, 5 MG KIT Inject 3.31 Units into the skin every morning.   triamcinolone cream (KENALOG) 0.1 % Apply 1 application topically as needed (itching).   vitamin E 1000 UNIT capsule Take 1,000 Units by mouth in the morning.   XARELTO 20 MG TABS tablet TAKE 1 TABLET BY MOUTH DAILY WITH SUPPER.     Allergies:   Ivp dye [iodinated contrast media]   Social History   Socioeconomic History   Marital status: Married    Spouse name: Not on file   Number of children: 1   Years of education: Not on file   Highest education level: Not on file  Occupational History   Not on file  Tobacco Use   Smoking status: Never   Smokeless tobacco: Never  Vaping Use   Vaping Use: Never used  Substance and Sexual Activity   Alcohol use: Never   Drug use: Never   Sexual activity: Yes    Comment: 1st intercourse 56 yo-5 partners  Other Topics Concern   Not on  file  Social History Narrative   ** Merged History Encounter **       Married to Molly Maduro,  has 1 daughter and a granddaughter the patient's son died in childhood due to a motor vehicle wreck 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 No alcohol tobacco or drug use 04/07/2017    Social Determinants of Health   Financial Resource Strain: Medium Risk (12/30/2017)   Overall Financial Resource Strain (CARDIA)    Difficulty of Paying Living Expenses: Somewhat hard  Food Insecurity: No Food Insecurity (12/30/2017)   Hunger Vital Sign    Worried About Running Out of Food in the Last Year: Never true    Ran Out of Food in the Last Year: Never true  Transportation Needs: No Transportation Needs (04/13/2020)   PRAPARE - Administrator, Civil Service (Medical): No    Lack of Transportation (Non-Medical): No  Physical Activity: Insufficiently Active (12/30/2017)   Exercise Vital Sign    Days of Exercise per Week: 7 days    Minutes of Exercise per Session: 20 min  Stress: No Stress Concern Present (12/30/2017)   [employer] of Occupational Health - Occupational Stress Questionnaire    Feeling of Stress : Not at all  Social Connections: Moderately Integrated (12/30/2017)   Social Connection and Isolation Panel [NHANES]    Frequency of Communication with Friends and Family: More than three times a week    Frequency of Social Gatherings with Friends and Family: Three times a week    Attends Religious Services: 1 to 4 times per year    Active Member of Clubs or Organizations: No    Attends Banker Meetings: Never    Marital Status: Married     Family History: The patient's family history includes AAA (abdominal aortic aneurysm) in her mother; Diabetes in her mother; Hypertension in her mother. There is no history of Colon cancer or Stomach cancer.  ROS:   Please see the history of present illness.    All other systems reviewed and are negative.  EKGs/Labs/Other Studies Reviewed:    The following studies were reviewed  today: Right Heart Cath 01/01/2022: Findings:   RA = 9 RV = 58/10 PA = 56/23 (37) R PCW = 27 (v = 39) L PCW = 25 (v = 31) Fick cardiac output/index = 4.8/2.7 Thermo CO/CI = 4.7/2.7 PVR = 2.10 WU Ao sat = 98% PA sat = 62%, 61%   Assessment: 1. Elevated left-sided filling pressures with mild to moderate pulmonary venous HTN and normal output 2. Prominent v-waves in PCWP tracing suggestive of significant MR   Plan/Discussion:   Increase diuretics. F/u with Structural Heart team re: mTEER. Will need TEE as well.   2d Echo 12/16/2021: IMPRESSIONS     1. Left ventricular ejection fraction, by estimation, is 35 to 40%. The  left ventricle has moderately decreased function. The left ventricle  demonstrates global hypokinesis. The left ventricular internal cavity size  was moderately to severely dilated.  There is mild asymmetric left ventricular hypertrophy of the basal-septal  segment. Left ventricular diastolic parameters are consistent with Grade  II diastolic dysfunction (pseudonormalization). There is akinesis of the  left ventricular, mid-apical  inferior wall, apical segment and inferolateral wall.   2. Right ventricular systolic function is normal. The right ventricular  size is normal.   3. Left atrial size was severely dilated.   4. Right atrial size was mild to moderately dilated.  5. The mitral valve is abnormal. Severe mitral valve regurgitation.   6. The aortic valve is tricuspid. There is moderate calcification of the  aortic valve. Aortic valve regurgitation is mild to moderate. Mild aortic  valve stenosis.   Recent Labs: 06/30/2021: TSH 2.195 12/09/2021: B Natriuretic Peptide 3,875.2 12/25/2021: ALT 18; Magnesium 2.2; Platelet Count 68 01/01/2022: BUN 24; Creatinine, Ser 1.32; Hemoglobin 11.2; Potassium 3.7; Sodium 141  Recent Lipid Panel    Component Value Date/Time   CHOL 72 07/24/2020 0501   TRIG 57 12/25/2021 0841   HDL 17 (L) 07/24/2020 0501   CHOLHDL  4.2 07/24/2020 0501   VLDL 35 07/24/2020 0501   LDLCALC 20 07/24/2020 0501   Risk Assessment/Calculations:     STS Surgical Risk Assessment:  Isolated MVR  Procedure Type: Isolated MVR Perioperative Outcome Estimate % Operative Mortality 4.58% Morbidity & Mortality 16.6% Stroke 3.98% Renal Failure 3.34% Reoperation 6.02% Prolonged Ventilation 9.39% Deep Sternal Wound Infection 0.036% [place] Hospital Stay (>14 days) 10.2% Short Hospital Stay (<6 days)* 19.6%  Isolated MV repair  Procedure Type: Isolated MVr Perioperative Outcome Estimate % Operative Mortality 3.01% Morbidity & Mortality 10.9% Stroke 2.79% Renal Failure 0.941% Reoperation 4.38% Prolonged Ventilation 5.7% Deep Sternal Wound Infection 0.036% [place] Hospital Stay (>14 days) 5.64% Short Hospital Stay (<6 days)* 37.2%  Physical Exam:    VS:  BP (!) 108/50   Pulse (!) 53   Ht $R'5\' 8"'mI$  (1.727 m)   Wt 141 lb 12.8 oz (64.3 kg)   LMP 02/26/2014   SpO2 99%   BMI 21.56 kg/m     Wt Readings from Last 3 Encounters:  01/03/22 141 lb 12.8 oz (64.3 kg)  01/01/22 143 lb (64.9 kg)  12/25/21 145 lb 9.6 oz (66 kg)     GEN:  Well nourished, well developed in no acute distress HEENT: Normal NECK: No JVD; No carotid bruits LYMPHATICS: No lymphadenopathy CARDIAC: RRR, soft systolic murmur at the left lower sternal border RESPIRATORY:  Clear to auscultation without rales, wheezing or rhonchi  ABDOMEN: Soft, non-tender, non-distended MUSCULOSKELETAL: Trace bilateral pretibial edema; No deformity  SKIN: Warm and dry NEUROLOGIC:  Alert and oriented x 3 PSYCHIATRIC:  Normal affect   ASSESSMENT:    1. Nonrheumatic mitral valve regurgitation    PLAN:    In order of problems listed above:  72 year old woman with ischemic cardiomyopathy and moderate LV dysfunction with inferoapical scar and aneurysm and absence of obstructive CAD has developed progressive symptoms of heart failure with NYHA functional class III symptoms  and also progressive now severe mitral regurgitation.  Comorbid issues include chronic TPN with multiple upper extremity and central venous occlusions associated with long-term catheter use.  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  I personally reviewed her echo images today.  I compared her recent echo to 1 from January 2023.  She has moderate segmental LV dysfunction as described above.  RV function appears to be normal.  There is severe central mitral regurgitation with marked change compared to her previous echo study.  Her mitral valve leaflets appear thickened and she may have inflammatory mitral valve disease.  She does not appear to have any mitral stenosis and I think she may be a candidate for transcatheter edge-to-edge repair of the mitral valve.  I discussed the natural history of mitral regurgitation with its association with chronic  systolic heart failure today.  She is clinically improved with medical therapy but is at high risk of further decompensation with this degree of mitral regurgitation.  I have recommended a transesophageal echocardiogram to better define the functional anatomy of her mitral regurgitation and assess whether she might be a candidate for transcatheter edge-to-edge repair of the mitral valve.  I reviewed the risks, indications, and alternatives to transesophageal echocardiography.  The patient understands the risk of esophageal injury or significant complication are extremely low.  She agrees to proceed.  Once her TEE is completed, we will review her case with our multidisciplinary heart valve team and assess treatment options further.  As part of her evaluation today, I demonstrated a procedural animation of the MitraClip procedure and explained each of the steps of the procedure, expected postoperative course,  and again reviewed potential benefit of the procedure.  All of her questions were answered and they will proceed with transesophageal echo followed by valve team review.      Shared Decision Making/Informed Consent The risks [esophageal damage, perforation (1:10,000 risk), bleeding, pharyngeal hematoma as well as other potential complications associated with conscious sedation including aspiration, arrhythmia, respiratory failure and death], benefits (treatment guidance and diagnostic support) and alternatives of a transesophageal echocardiogram were discussed in detail with Ms. Manera and she is willing to proceed.     Medication Adjustments/Labs and Tests Ordered: Current medicines are reviewed at length with the patient today.  Concerns regarding medicines are outlined above.  No orders of the defined types were placed in this encounter.  No orders of the defined types were placed in this encounter.   Patient Instructions  Medication Instructions:  Your physician recommends that you continue on your current medications as directed. Please refer to the Current Medication list given to you today.  *If you need a refill on your cardiac medications before your next appointment, please call your pharmacy*   Lab Work: NONE If you have labs (blood work) drawn today and your tests are completely normal, you will receive your results only by: [place] (if you have MyChart) OR A paper copy in the mail If you have any lab test that is abnormal or we need to change your treatment, we will call you to review the results.   Testing/Procedures: Your physician has requested that you have a TEE. During a TEE, sound waves are used to create images of your heart. It provides your doctor with information about the size and shape of your heart and how well your heart's chambers and valves are working. In this test, a transducer is attached to the end of a flexible tube that's guided down your  throat and into your esophagus (the tube leading from you mouth to your stomach) to get a more detailed image of your heart. You are not awake for the procedure. Please see the instruction sheet given to you today. For further information please visit [email or web address].     Follow-Up: To be determined At [facility], you and your health needs are our priority.  As part of our continuing mission to provide you with exceptional heart care, we have created designated Provider Care Teams.  These Care Teams include your primary Cardiologist (physician) and Advanced Practice Providers (APPs -  Physician Assistants and Nurse Practitioners) who all work together to provide you with the care you need, when you need it.    Important Information About Sugar         Signed, [name], MD  01/03/2022 10:16 AM    [place]

## 2022-01-03 NOTE — Patient Instructions (Signed)
Medication Instructions:  Your physician recommends that you continue on your current medications as directed. Please refer to the Current Medication list given to you today.  *If you need a refill on your cardiac medications before your next appointment, please call your pharmacy*   Lab Work: NONE If you have labs (blood work) drawn today and your tests are completely normal, you will receive your results only by: [place] (if you have MyChart) OR A paper copy in the mail If you have any lab test that is abnormal or we need to change your treatment, we will call you to review the results.   Testing/Procedures: Your physician has requested that you have a TEE. During a TEE, sound waves are used to create images of your heart. It provides your doctor with information about the size and shape of your heart and how well your heart's chambers and valves are working. In this test, a transducer is attached to the end of a flexible tube that's guided down your throat and into your esophagus (the tube leading from you mouth to your stomach) to get a more detailed image of your heart. You are not awake for the procedure. Please see the instruction sheet given to you today. For further information please visit [email or web address].     Follow-Up: To be determined At [facility], you and your health needs are our priority.  As part of our continuing mission to provide you with exceptional heart care, we have created designated Provider Care Teams.  These Care Teams include your primary Cardiologist (physician) and Advanced Practice Providers (APPs -  Physician Assistants and Nurse Practitioners) who all work together to provide you with the care you need, when you need it.    Important Information About Sugar

## 2022-01-04 ENCOUNTER — Ambulatory Visit
Admission: EM | Admit: 2022-01-04 | Discharge: 2022-01-04 | Disposition: A | Discharge status: Home / Self Care | Payer: Medicare Other | Attending: Emergency Medicine | Admitting: Emergency Medicine

## 2022-01-04 DIAGNOSIS — N760 Acute vaginitis: Secondary | ICD-10-CM | POA: Diagnosis not present

## 2022-01-04 DIAGNOSIS — R300 Dysuria: Secondary | ICD-10-CM

## 2022-01-04 DIAGNOSIS — M62830 Muscle spasm of back: Secondary | ICD-10-CM | POA: Diagnosis not present

## 2022-01-04 LAB — POCT URINALYSIS DIP (MANUAL ENTRY)
Bilirubin, UA: NEGATIVE
Glucose, UA: NEGATIVE mg/dL
Ketones, POC UA: NEGATIVE mg/dL
Leukocytes, UA: NEGATIVE
Nitrite, UA: NEGATIVE
Protein Ur, POC: 30 mg/dL — AB
Spec Grav, UA: 1.015 (ref 1.010–1.025)
Urobilinogen, UA: 0.2 E.U./dL
pH, UA: 5.5 (ref 5.0–8.0)

## 2022-01-04 MED ORDER — FLUCONAZOLE 150 MG PO TABS: ORAL_TABLET | ORAL | 0 refills | Status: DC

## 2022-01-04 NOTE — ED Provider Notes (Signed)
[facility]    CSN: [id number] Arrival date & time: 01/04/22  1135    HISTORY   Chief Complaint  Patient presents with   Urinary Frequency   Back Pain   HPI [name] [name] is a pleasant, 71 y.o. female who presents to urgent care today. Patient complains of a 1 day history of increased frequency of urination, lower back pain with occasional pain radiating down her anterior left thigh.  Patient denies fever, aches, chills, nausea, vomiting, diarrhea, burning with urination, increased urge to urinate, sensation of incomplete emptying, abnormal vaginal discharge or vaginal bleeding.  Patient states she is walking with a cane today because she feels unsteady on her left leg.  The history is provided by the patient.   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   Carnitine deficiency ([place]) 05/25/2018   Carotid stenosis    Carotid US (9/15):  R 40-59%; L 1-39% >> FU 1 year   Cataract    Closed left subtrochanteric femur fracture (HCC) 12/31/2017   Clotting disorder ([place])    Congestive heart failure (CHF) (HCC)    EF 25-30%   COVID-19 2022   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   Fever 10/29/2020   Fracture of left clavicle    GERD (gastroesophageal reflux disease)    History of blood transfusion 2013   anemia   Hx of cardiovascular stress test    Myoview (9/15):  inf-apical scar; no ischemia; EF 47% - low risk    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 12/12/2011   Osteoporosis    Pancytopenia 10/07/2011   Pathologic fracture of neck of femur ([place])    Personal history of colonic polyps    Renal insufficiency    hx of yrs ago   Serratia marcescens infection -  bactermia assoc w/ PICC 01/18/2015   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HCC)    Splenomegaly    By ultrasound   Stroke ([place]) 07/2020   Thrombophilia ([place])    Vitamin D deficiency    Patient Active Problem List   Diagnosis Date Noted   STEC (Shiga toxin-producing Escherichia coli) 07/01/2021   Streptococcal sepsis, unspecified ([place]) 06/30/2021   Left arm cellulitis 06/29/2021   Cellulitis of left breast 06/29/2021   Humerus fracture - right 06/29/2021   New onset a-fib ([place]) 06/29/2021   Carotid artery stenosis 10/31/2020   Macrocytic anemia 10/27/2020   NAFLD (nonalcoholic fatty liver disease) 09/21/2020   Stroke (cerebrum) ([place]) 07/25/2020   History of CVA (cerebrovascular accident) 07/24/2020   Thrombocytopenia ([place]) 07/24/2020   Chronic renal failure, stage 3a ([place]) 07/24/2020   Pressure injury of skin 07/24/2020   Stroke ([place]) 07/23/2020   Carnitine deficiency ([place]) 05/25/2018   Hypotension 12/30/2017   Bradycardia 12/30/2017   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[place]) 07/31/2017   Degenerative joint disease 07/08/2016   PICC line infection 05/16/2015   H/O mesenteric infarction 11/17/2013   Splenomegaly    Hx of adenomatous colonic polyps 74/94/4967   Chronic systolic heart failure (HCC) - LVEF 35-40% 12/25/2011  Hypokalemia 12/14/2011   Acute renal failure superimposed on stage 3a chronic kidney disease ([place]) 12/08/2011   Hyponatremia 12/08/2011   Osteoporosis 03/14/2010   Anemia of chronic disease 10/01/2009   GERD (gastroesophageal reflux disease) 11/19/2008   ALKALINE PHOSPHATASE, ELEVATED 10/12/2008   VITAMIN D DEFICIENCY 01/19/2008   TRANSAMINASES, SERUM, ELEVATED 01/19/2008   Bacterial overgrowth syndrome 11/19/2004   Acquired short bowel syndrome 11/20/2003   Past Surgical History:  Procedure Laterality Date   APPENDECTOMY  yrs ago   CHOLECYSTECTOMY  yrs ago    COLONOSCOPY  12/05/2005   internal hemorrhoids (for polyp surveillance)   COLONOSCOPY  04/26/2012   Procedure: COLONOSCOPY;  Surgeon: Gatha Mayer, MD;  Location: [facility];  Service: Endoscopy;  Laterality: N/A;   COLONOSCOPY WITH PROPOFOL N/A 03/18/2016   Procedure: COLONOSCOPY WITH PROPOFOL;  Surgeon: Gatha Mayer, MD;  Location: [facility];  Service: Endoscopy;  Laterality: N/A;   ESOPHAGOGASTRODUODENOSCOPY  01/22/2009   erosive esophagitis   HARDWARE REMOVAL Left 12/31/2017   Procedure: HARDWARE REMOVAL;  Surgeon: Rod Can, MD;  Location: [facility];  Service: Orthopedics;  Laterality: Left;   INTRAMEDULLARY (IM) NAIL INTERTROCHANTERIC Left 12/31/2017   Procedure: INTRAMEDULLARY (IM) NAIL SUBTROCHANTRIC;  Surgeon: Rod Can, MD;  Location: [facility];  Service: Orthopedics;  Laterality: Left;   IR CV LINE INJECTION  03/23/2018   IR CV LINE INJECTION  10/02/2021   IR FLUORO GUIDE CV LINE LEFT  01/01/2018   IR FLUORO GUIDE CV LINE LEFT  03/24/2018   IR FLUORO GUIDE CV LINE LEFT  05/21/2018   IR FLUORO GUIDE CV LINE LEFT  07/09/2018   IR FLUORO GUIDE CV LINE LEFT  12/28/2020   IR FLUORO GUIDE CV LINE LEFT  07/02/2021   IR FLUORO GUIDE CV LINE RIGHT  11/01/2020   IR GENERIC HISTORICAL  07/04/2016   IR REMOVAL TUN CV CATH W/O FL 07/04/2016 Ascencion Dike, PA-C [facility]   IR GENERIC HISTORICAL  07/10/2016   IR US GUIDE VASC ACCESS LEFT 07/10/2016 Arne Cleveland, MD [facility]   IR GENERIC HISTORICAL  07/10/2016   IR FLUORO GUIDE CV LINE LEFT 07/10/2016 Arne Cleveland, MD [facility]   IR PATIENT EVAL TECH 0-60 MINS  10/07/2017   IR PATIENT EVAL TECH 0-60 MINS  03/09/2018   IR RADIOLOGIST EVAL & MGMT  07/21/2017   IR RADIOLOGIST EVAL & MGMT  11/14/2020   IR RADIOLOGIST EVAL & MGMT  11/27/2020   IR RADIOLOGIST EVAL & MGMT  01/24/2021   IR REMOVAL TUN CV CATH W/O FL  12/28/2020   IR TRANSCATH PLC STENT  INITIAL VEIN  INC ANGIOPLASTY  12/28/2020   IR US GUIDE VASC ACCESS LEFT  11/01/2020    IR US GUIDE VASC ACCESS LEFT  12/28/2020   IR US GUIDE VASC ACCESS LEFT  12/28/2020   IR US GUIDE VASC ACCESS LEFT  12/28/2020   IR US GUIDE VASC ACCESS RIGHT  11/01/2020   IR VENO/EXT/UNI LEFT  11/01/2020   IR VENOCAVAGRAM SVC  12/28/2020   LEFT HEART CATH AND CORONARY ANGIOGRAPHY N/A 06/26/2020   Procedure: LEFT HEART CATH AND CORONARY ANGIOGRAPHY;  Surgeon: Jolaine Artist, MD;  Location: [facility];  Service: Cardiovascular;  Laterality: N/A;   ORIF PROXIMAL FEMORAL FRACTURE W/ ITST NAIL SYSTEM  03/2007   left, Dr. Shellia Carwin   RIGHT HEART CATH N/A 01/01/2022   Procedure: RIGHT HEART CATH;  Surgeon: Jolaine Artist, MD;  Location: [facility];  Service: Cardiovascular;  Laterality: N/A;   SMALL INTESTINE SURGERY  2005   multiple with right colon resection for ischemia/infarct   OB History     Gravida  2   Para  2   Term  0   Preterm  0   AB  0   Living         SAB  0   IAB  0   Ectopic  0   Multiple      Live Births             Home Medications    Prior to Admission medications   Medication Sig Start Date End Date Taking? Authorizing Provider  acetaminophen (TYLENOL) 500 MG tablet Take 1,000 mg by mouth every 6 (six) hours as needed for mild pain.    [provider]  ADULT TPN Inject 1,800 mLs into the vein 4 (four) times a week. Pt receives home TPN from [facility]:  1800 mL bag, four nights weekly (Monday, Tuesday, Wednesday, Thursday for 8 hours (includes 1 hour taper up and down).    [provider]  Calcium Carb-Cholecalciferol (CALCIUM + D3 PO) Take 1 tablet by mouth in the morning and at bedtime. Morning & early evening.    [provider]  cetirizine (ZYRTEC) 10 MG tablet Take 10 mg by mouth daily as needed for allergies.    [provider]  clobetasol ointment (TEMOVATE) 6.30 % Apply 1 application  topically 2 (two) times daily as needed (irritation). 06/14/21   [provider]  diazepam  (VALIUM) 2 MG tablet Take 1 tablet (2 mg total) by mouth every 6 (six) hours as needed for anxiety. Take one tablet one hour prior to scan for claustrophobia. 01/01/22   Gardenia Phlegm, NP  diphenhydrAMINE (BENADRYL) 25 mg capsule Take 25 mg by mouth every 6 (six) hours as needed for itching or sleep.    [provider]  empagliflozin (JARDIANCE) 10 MG TABS tablet Take 1 tablet (10 mg total) by mouth daily before breakfast. 06/11/21   Bensimhon, Shaune Pascal, MD  ergocalciferol (DRISDOL) 200 MCG/ML drops Take 6 mLs (48,000 Units total) by mouth 3 (three) times a week. 08/21/21   Gatha Mayer, MD  furosemide (LASIX) 40 MG tablet Take 2 tablets (80 mg total) by mouth daily. Patient taking differently: Take 40 mg by mouth 2 (two) times daily. Morning & early afternoon. 12/16/21   Bensimhon, Shaune Pascal, MD  Heparin Sodium, Porcine, (HEPARIN LOCK FLUSH IJ) Inject 5 mLs as directed 4 (four) times a week. Monday, Tuesday, Wednesday, Thursday in the morning with TPN    [provider]  losartan (COZAAR) 25 MG tablet Take 1 tablet (25 mg total) by mouth at bedtime. 12/16/21   Bensimhon, Shaune Pascal, MD  omeprazole (PRILOSEC) 40 MG capsule TAKE 1 CAPSULE BY MOUTH EVERY DAY BEFORE BREAKFAST 12/27/21   Gatha Mayer, MD  ondansetron (ZOFRAN-ODT) 8 MG disintegrating tablet TAKE 1 TABLET BY MOUTH EVERY 8 HOURS AS NEEDED FOR NAUSEA OR VOMITING. 09/09/21   Gatha Mayer, MD  Pediatric Multivit-Minerals (FLINTSTONES GUMMIES PO) Take 4 tablets by mouth in the morning.    [provider]  potassium chloride SA (KLOR-CON M20) 20 MEQ tablet Take 1 tablet (20 mEq total) by mouth daily. 12/16/21   Bensimhon, Shaune Pascal, MD  sodium chloride 0.9 % infusion Inject 900 mLs into the vein every 14 (fourteen) days. Uses for TPN 10/27/17   [provider]  spironolactone (ALDACTONE) 25 MG  tablet TAKE 1 TABLET (25 MG TOTAL) BY MOUTH DAILY. Patient taking differently: Take 12.5 mg by mouth in the morning.  08/28/21   Bensimhon, Shaune Pascal, MD  Teduglutide, rDNA, 5 MG KIT Inject 3.31 Units into the skin every morning. 10/17/21   Gatha Mayer, MD  triamcinolone cream (KENALOG) 0.1 % Apply 1 application topically as needed (itching).    [provider]  vitamin E 1000 UNIT capsule Take 1,000 Units by mouth in the morning.    [provider]  XARELTO 20 MG TABS tablet TAKE 1 TABLET BY MOUTH DAILY WITH SUPPER. 08/30/21   [name], MD    Family History Family History  Problem Relation Age of Onset   Diabetes Mother    Hypertension Mother    AAA (abdominal aortic aneurysm) Mother    Colon cancer Neg Hx    Stomach cancer Neg Hx    Social History Social History   Tobacco Use   Smoking status: Never   Smokeless tobacco: Never  Vaping Use   Vaping Use: Never used  Substance Use Topics   Alcohol use: Never   Drug use: Never   Allergies   Ivp dye [iodinated contrast media]  Review of Systems Review of Systems Pertinent findings revealed after performing a 14 point review of systems has been noted in the history of present illness.  Physical Exam Triage Vital Signs ED Triage Vitals  Enc Vitals Group     BP 03/08/21 0827 (!) 147/82     Pulse Rate 03/08/21 0827 72     Resp 03/08/21 0827 18     Temp 03/08/21 0827 98.3 F (36.8 C)     Temp Source 03/08/21 0827 Oral     SpO2 03/08/21 0827 98 %     Weight --      Height --      Head Circumference --      Peak Flow --      Pain Score 03/08/21 0826 5     Pain Loc --      Pain Edu? --      Excl. in [place]? --   No data found.  Updated Vital Signs BP 104/64 (BP Location: Right Arm)   Pulse (!) 53   Temp 98 F (36.7 C) (Oral)   Resp 16   LMP 02/26/2014   SpO2 95%   Physical Exam Vitals and nursing note reviewed.  Constitutional:      General: She is not in acute distress.    Appearance: Normal appearance. She is not ill-appearing.  HENT:     Head: Normocephalic and atraumatic.  Eyes:     General:  Lids are normal.        Right eye: No discharge.        Left eye: No discharge.     Extraocular Movements: Extraocular movements intact.     Conjunctiva/sclera: Conjunctivae normal.     Right eye: Right conjunctiva is not injected.     Left eye: Left conjunctiva is not injected.  Neck:     Trachea: Trachea and phonation normal.  Cardiovascular:     Rate and Rhythm: Normal rate and regular rhythm.     Pulses: Normal pulses.     Heart sounds: Normal heart sounds. No murmur heard.    No friction rub. No gallop.  Pulmonary:     Effort: Pulmonary effort is normal. No accessory muscle usage, prolonged expiration or respiratory distress.     Breath sounds: Normal breath  sounds. No stridor, decreased air movement or transmitted upper airway sounds. No decreased breath sounds, wheezing, rhonchi or rales.  Chest:     Chest wall: No tenderness.  Abdominal:     General: Abdomen is flat. Bowel sounds are normal. There is no distension.     Palpations: Abdomen is soft.     Tenderness: There is abdominal tenderness in the suprapubic area. There is no right CVA tenderness or left CVA tenderness.     Hernia: No hernia is present.  Musculoskeletal:        General: Normal range of motion.     Cervical back: Normal range of motion and neck supple. Normal range of motion.  Lymphadenopathy:     Cervical: No cervical adenopathy.  Skin:    General: Skin is warm and dry.     Findings: No erythema or rash.  Neurological:     General: No focal deficit present.     Mental Status: She is alert and oriented to person, place, and time.  Psychiatric:        Mood and Affect: Mood normal.        Behavior: Behavior normal.     Visual Acuity Right Eye Distance:   Left Eye Distance:   Bilateral Distance:    Right Eye Near:   Left Eye Near:    Bilateral Near:     [facility] Couse / Diagnostics / Procedures:     Radiology No results found.  Procedures Procedures (including critical care  time) EKG  Pending results:  Labs Reviewed  POCT URINALYSIS DIP (MANUAL ENTRY) - Abnormal; Notable for the following components:      Result Value   Blood, UA small (*)    Protein Ur, POC =30 (*)    All other components within normal limits  URINE CULTURE  CERVICOVAGINAL ANCILLARY ONLY    Medications Ordered in UC: Medications - No data to display  UC Diagnoses / Final Clinical Impressions(s)   I have reviewed the triage vital signs and the nursing notes.  Pertinent labs & imaging results that were available during my care of the patient were reviewed by me and considered in my medical decision making (see chart for details).    Final diagnoses:  Dysuria  Acute vaginitis  Spasm of lumbar paraspinous muscle    STD screening was performed, patient advised that the results be posted to their MyChart and if any of the results are positive, they will be notified by phone, further treatment will be provided as indicated based on results of STD screening. Urinalysis today was normal. Diflucan prescribed for empiric treatment of presumed vaginal yeast infection based on symptoms described by patient and history of patient taking Jardiance. Patient advised that they will be contacted with results of the urine culture and that treatment will be provided as indicated based on results. Return precautions advised.  ED Prescriptions     Medication Sig Dispense Auth. Provider   fluconazole (DIFLUCAN) 150 MG tablet Take 1 tablet today.  Take second tablet 3 days later. 2 tablet Lynden Oxford Scales, PA-C      PDMP not reviewed this encounter.  Disposition Upon Discharge:  Condition: stable for discharge home  Patient presented with concern for an acute illness with associated systemic symptoms and significant discomfort requiring urgent management. In my opinion, this is a condition that a prudent lay person (someone who possesses an average knowledge of health and medicine) may  potentially expect to result in complications if not addressed  urgently such as respiratory distress, impairment of bodily function or dysfunction of bodily organs.   As such, the patient has been evaluated and assessed, work-up was performed and treatment was provided in alignment with urgent care protocols and evidence based medicine.  Patient/parent/caregiver has been advised that the patient may require follow up for further testing and/or treatment if the symptoms continue in spite of treatment, as clinically indicated and appropriate.  Routine symptom specific, illness specific and/or disease specific instructions were discussed with the patient and/or caregiver at length.  Prevention strategies for avoiding STD exposure were also discussed.  The patient will follow up with their current PCP if and as advised. If the patient does not currently have a PCP we will assist them in obtaining one.   The patient may need specialty follow up if the symptoms continue, in spite of conservative treatment and management, for further workup, evaluation, consultation and treatment as clinically indicated and appropriate.  Patient/parent/caregiver verbalized understanding and agreement of plan as discussed.  All questions were addressed during visit.  Please see discharge instructions below for further details of plan.  Discharge Instructions:   Discharge Instructions      The mainstay of therapy for musculoskeletal pain is reduction of inflammation and relaxation of tension which is causing inflammation.  Keep in mind, pain always begets more pain.  To help you stay ahead of your pain and inflammation, I have provided the following regimen for you:   Please begin taking Tylenol 1000 mg 3 times daily (every 8 hours) as soon as you pick up your prescriptions from the pharmacy.   During the day, please set aside time to apply ice to the affected area 4 times daily for 20 minutes each application.  This  can be achieved by using a bag of frozen peas or corn, a Ziploc bag filled with ice and water, or Ziploc bag filled with half rubbing alcohol and half Dawn dish detergent, frozen into a slush.  Please be careful not to apply ice directly to your skin, always place a soft cloth between you and the ice pack.   You are welcome to use topical anti-inflammatory creams such as Voltaren gel, Capsaicin or Aspercreme as recommended.  These medications are available over-the-counter, please follow manufactures instructions for use.     Please consider discussing referral to physical therapy with your primary care provider.  Physical therapist are very good at teasing out the underlying cause of acute lower back pain and helping with prevention of future recurrences.   Please avoid attempts to stretch or strengthen the affected area until you are feeling completely pain-free.  Attempts to do so will only prolong the healing process.   The urinalysis that we performed in the clinic today was normal.  Because you are to have increased frequency of urination and burning with urination, urine culture will be performed per our protocol.  The result of the urine culture will be available in the next 3 to 5 days and will be posted to your MyChart account.  If there is an abnormal finding, you will be contacted by phone and advised of further treatment recommendations, if any.  Based on the symptoms and concerns you shared with me today, you were treated for presumed vaginal yeast infection with fluconazole (Diflucan), take the first tablet today and take the second tablet three days after the first tablet.     The results of your vaginal swab test which tests for BV, yeast, gonorrhea, chlamydia  and trichomonas (it is a standard multi-pathogen test) will be posted to your MyChart account in the next 3 to 5 days.  If any of your results are abnormal, you will receive a phone call regarding further treatment.  Additional  prescriptions, if any are needed, will be provided for you at your pharmacy.      If you have not had complete resolution of your symptoms after completing treatment, please return for repeat evaluation.   Thank you for visiting urgent care today.  I appreciate the opportunity to participate in your care.           This office note has been dictated using Museum/gallery curator.  Unfortunately, this method of dictation can sometimes lead to typographical or grammatical errors.  I apologize for your inconvenience in advance if this occurs.  Please do not hesitate to reach out to me if clarification is needed.       Lynden Oxford Scales, [place] 01/04/22 [phone]

## 2022-01-04 NOTE — Discharge Instructions (Addendum)
The mainstay of therapy for musculoskeletal pain is reduction of inflammation and relaxation of tension which is causing inflammation.  Keep in mind, pain always begets more pain.  To help you stay ahead of your pain and inflammation, I have provided the following regimen for you:   Please begin taking Tylenol 1000 mg 3 times daily (every 8 hours) as soon as you pick up your prescriptions from the pharmacy.   During the day, please set aside time to apply ice to the affected area 4 times daily for 20 minutes each application.  This can be achieved by using a bag of frozen peas or corn, a Ziploc bag filled with ice and water, or Ziploc bag filled with half rubbing alcohol and half Dawn dish detergent, frozen into a slush.  Please be careful not to apply ice directly to your skin, always place a soft cloth between you and the ice pack.   You are welcome to use topical anti-inflammatory creams such as Voltaren gel, Capsaicin or Aspercreme as recommended.  These medications are available over-the-counter, please follow manufactures instructions for use.     Please consider discussing referral to physical therapy with your primary care provider.  Physical therapist are very good at teasing out the underlying cause of acute lower back pain and helping with prevention of future recurrences.   Please avoid attempts to stretch or strengthen the affected area until you are feeling completely pain-free.  Attempts to do so will only prolong the healing process.   The urinalysis that we performed in the clinic today was normal.  Because you are to have increased frequency of urination and burning with urination, urine culture will be performed per our protocol.  The result of the urine culture will be available in the next 3 to 5 days and will be posted to your MyChart account.  If there is an abnormal finding, you will be contacted by phone and advised of further treatment recommendations, if any.  Based on the  symptoms and concerns you shared with me today, you were treated for presumed vaginal yeast infection with fluconazole (Diflucan), take the first tablet today and take the second tablet three days after the first tablet.     The results of your vaginal swab test which tests for BV, yeast, gonorrhea, chlamydia and trichomonas (it is a standard multi-pathogen test) will be posted to your MyChart account in the next 3 to 5 days.  If any of your results are abnormal, you will receive a phone call regarding further treatment.  Additional prescriptions, if any are needed, will be provided for you at your pharmacy.      If you have not had complete resolution of your symptoms after completing treatment, please return for repeat evaluation.   Thank you for visiting urgent care today.  I appreciate the opportunity to participate in your care.

## 2022-01-04 NOTE — ED Triage Notes (Signed)
The pt c/o possible UTI she and c/o urinary frequency, back pain and general discomfort.  Started: yesterday

## 2022-01-05 ENCOUNTER — Ambulatory Visit: Payer: Self-pay

## 2022-01-05 LAB — URINE CULTURE

## 2022-01-06 ENCOUNTER — Encounter (HOSPITAL_COMMUNITY): Payer: Self-pay | Admitting: Internal Medicine

## 2022-01-06 LAB — CERVICOVAGINAL ANCILLARY ONLY
Bacterial Vaginitis (gardnerella): NEGATIVE
Candida Glabrata: POSITIVE — AB
Candida Vaginitis: POSITIVE — AB
Chlamydia: NEGATIVE
Comment: NEGATIVE
Comment: NEGATIVE
Comment: NEGATIVE
Comment: NEGATIVE
Comment: NEGATIVE
Comment: NORMAL
Neisseria Gonorrhea: NEGATIVE
Trichomonas: NEGATIVE

## 2022-01-07 ENCOUNTER — Other Ambulatory Visit: Payer: Self-pay | Admitting: Cardiovascular Disease

## 2022-01-07 DIAGNOSIS — I340 Nonrheumatic mitral (valve) insufficiency: Secondary | ICD-10-CM

## 2022-01-09 ENCOUNTER — Other Ambulatory Visit: Payer: Self-pay | Admitting: Radiation Therapy

## 2022-01-11 ENCOUNTER — Ambulatory Visit
Admission: RE | Admit: 2022-01-11 | Discharge: 2022-01-11 | Disposition: A | Discharge status: Home / Self Care | Payer: Medicare Other | Source: Ambulatory Visit | Attending: Adult Health | Admitting: Adult Health

## 2022-01-11 DIAGNOSIS — Z8673 Personal history of transient ischemic attack (TIA), and cerebral infarction without residual deficits: Secondary | ICD-10-CM

## 2022-01-11 DIAGNOSIS — I639 Cerebral infarction, unspecified: Secondary | ICD-10-CM

## 2022-01-11 DIAGNOSIS — I6501 Occlusion and stenosis of right vertebral artery: Secondary | ICD-10-CM | POA: Diagnosis not present

## 2022-01-11 MED ORDER — GADOPICLENOL 0.5 MMOL/ML IV SOLN
6.0000 mL | Freq: Once | INTRAVENOUS | Status: AC | PRN
  Administered 2022-01-11: 6 mL via INTRAVENOUS

## 2022-01-13 ENCOUNTER — Encounter: Payer: Self-pay | Admitting: Internal Medicine

## 2022-01-14 ENCOUNTER — Telehealth: Payer: Self-pay

## 2022-01-14 ENCOUNTER — Other Ambulatory Visit (HOSPITAL_COMMUNITY): Payer: Self-pay | Admitting: *Deleted

## 2022-01-14 ENCOUNTER — Ambulatory Visit (HOSPITAL_COMMUNITY)
Admission: RE | Admit: 2022-01-14 | Discharge: 2022-01-14 | Disposition: A | Discharge status: Home / Self Care | Payer: Medicare Other | Source: Ambulatory Visit | Attending: Interventional Radiology | Admitting: Interventional Radiology

## 2022-01-14 ENCOUNTER — Ambulatory Visit (HOSPITAL_COMMUNITY)
Admission: RE | Admit: 2022-01-14 | Discharge: 2022-01-14 | Disposition: A | Discharge status: Other Acute Inpatient Hospital | Payer: Medicare Other | Attending: Internal Medicine | Admitting: Internal Medicine

## 2022-01-14 ENCOUNTER — Ambulatory Visit (HOSPITAL_BASED_OUTPATIENT_CLINIC_OR_DEPARTMENT_OTHER): Payer: Medicare Other | Admitting: Anesthesiology

## 2022-01-14 ENCOUNTER — Ambulatory Visit: Payer: Medicare Other | Admitting: Internal Medicine

## 2022-01-14 ENCOUNTER — Encounter (HOSPITAL_COMMUNITY)
Admission: RE | Disposition: A | Discharge status: Other Acute Inpatient Hospital | Payer: Self-pay | Source: Home / Self Care | Attending: Internal Medicine

## 2022-01-14 ENCOUNTER — Ambulatory Visit (HOSPITAL_BASED_OUTPATIENT_CLINIC_OR_DEPARTMENT_OTHER)
Admission: RE | Admit: 2022-01-14 | Discharge: 2022-01-14 | Disposition: A | Discharge status: Home / Self Care | Payer: Medicare Other | Source: Ambulatory Visit | Attending: Cardiovascular Disease | Admitting: Cardiovascular Disease

## 2022-01-14 ENCOUNTER — Ambulatory Visit (HOSPITAL_COMMUNITY): Payer: Medicare Other

## 2022-01-14 ENCOUNTER — Other Ambulatory Visit (HOSPITAL_COMMUNITY): Payer: Self-pay | Admitting: Interventional Radiology

## 2022-01-14 ENCOUNTER — Inpatient Hospital Stay: Payer: Medicare Other | Admitting: Internal Medicine

## 2022-01-14 ENCOUNTER — Ambulatory Visit (HOSPITAL_COMMUNITY): Payer: Medicare Other | Admitting: Anesthesiology

## 2022-01-14 ENCOUNTER — Encounter: Payer: Self-pay | Admitting: Internal Medicine

## 2022-01-14 ENCOUNTER — Other Ambulatory Visit: Payer: Self-pay

## 2022-01-14 DIAGNOSIS — M1990 Unspecified osteoarthritis, unspecified site: Secondary | ICD-10-CM

## 2022-01-14 DIAGNOSIS — Z8673 Personal history of transient ischemic attack (TIA), and cerebral infarction without residual deficits: Secondary | ICD-10-CM

## 2022-01-14 DIAGNOSIS — I509 Heart failure, unspecified: Secondary | ICD-10-CM | POA: Diagnosis not present

## 2022-01-14 DIAGNOSIS — Z789 Other specified health status: Secondary | ICD-10-CM

## 2022-01-14 DIAGNOSIS — I340 Nonrheumatic mitral (valve) insufficiency: Secondary | ICD-10-CM

## 2022-01-14 DIAGNOSIS — R0602 Shortness of breath: Secondary | ICD-10-CM | POA: Diagnosis not present

## 2022-01-14 DIAGNOSIS — I255 Ischemic cardiomyopathy: Secondary | ICD-10-CM | POA: Insufficient documentation

## 2022-01-14 DIAGNOSIS — I5022 Chronic systolic (congestive) heart failure: Secondary | ICD-10-CM | POA: Diagnosis not present

## 2022-01-14 DIAGNOSIS — Z7901 Long term (current) use of anticoagulants: Secondary | ICD-10-CM | POA: Diagnosis not present

## 2022-01-14 DIAGNOSIS — I080 Rheumatic disorders of both mitral and aortic valves: Secondary | ICD-10-CM | POA: Diagnosis not present

## 2022-01-14 DIAGNOSIS — K912 Postsurgical malabsorption, not elsewhere classified: Secondary | ICD-10-CM | POA: Diagnosis not present

## 2022-01-14 DIAGNOSIS — I700 Atherosclerosis of aorta: Secondary | ICD-10-CM | POA: Insufficient documentation

## 2022-01-14 DIAGNOSIS — I2510 Atherosclerotic heart disease of native coronary artery without angina pectoris: Secondary | ICD-10-CM | POA: Insufficient documentation

## 2022-01-14 DIAGNOSIS — A409 Streptococcal sepsis, unspecified: Secondary | ICD-10-CM

## 2022-01-14 DIAGNOSIS — R509 Fever, unspecified: Secondary | ICD-10-CM | POA: Diagnosis not present

## 2022-01-14 DIAGNOSIS — Z452 Encounter for adjustment and management of vascular access device: Secondary | ICD-10-CM | POA: Diagnosis not present

## 2022-01-14 HISTORY — PX: TEE WITHOUT CARDIOVERSION: SHX5443

## 2022-01-14 HISTORY — PX: BUBBLE STUDY: SHX6837

## 2022-01-14 HISTORY — PX: IR FLUORO GUIDE CV LINE LEFT: IMG2282

## 2022-01-14 LAB — POCT I-STAT, CHEM 8
BUN: 23 mg/dL (ref 8–23)
Calcium, Ion: 1.14 mmol/L — ABNORMAL LOW (ref 1.15–1.40)
Chloride: 111 mmol/L (ref 98–111)
Creatinine, Ser: 1.7 mg/dL — ABNORMAL HIGH (ref 0.44–1.00)
Glucose, Bld: 79 mg/dL (ref 70–99)
HCT: 37 % (ref 36.0–46.0)
Hemoglobin: 12.6 g/dL (ref 12.0–15.0)
Potassium: 4.3 mmol/L (ref 3.5–5.1)
Sodium: 139 mmol/L (ref 135–145)
TCO2: 18 mmol/L — ABNORMAL LOW (ref 22–32)

## 2022-01-14 SURGERY — ECHOCARDIOGRAM, TRANSESOPHAGEAL
Anesthesia: Monitor Anesthesia Care

## 2022-01-14 MED ORDER — LACTATED RINGERS IV SOLN: INTRAVENOUS | Status: DC | PRN

## 2022-01-14 MED ORDER — PROPOFOL 10 MG/ML IV BOLUS
INTRAVENOUS | Status: DC | PRN
  Administered 2022-01-14: 20 mg via INTRAVENOUS
  Administered 2022-01-14: 10 mg via INTRAVENOUS
  Administered 2022-01-14: 20 mg via INTRAVENOUS
  Administered 2022-01-14: 10 mg via INTRAVENOUS

## 2022-01-14 MED ORDER — PROPOFOL 500 MG/50ML IV EMUL
INTRAVENOUS | Status: DC | PRN
  Administered 2022-01-14: 75 ug/kg/min via INTRAVENOUS

## 2022-01-14 MED ORDER — SODIUM CHLORIDE 0.9 % IV SOLN: INTRAVENOUS | Status: DC

## 2022-01-14 MED ORDER — HEPARIN SOD (PORK) LOCK FLUSH 100 UNIT/ML IV SOLN
INTRAVENOUS | Status: AC
  Administered 2022-01-14: 500 [IU]
  Filled 2022-01-14: qty 5

## 2022-01-14 MED ORDER — LIDOCAINE HCL 1 % IJ SOLN
INTRAMUSCULAR | Status: AC
  Administered 2022-01-14: 10 mL
  Filled 2022-01-14: qty 20

## 2022-01-14 NOTE — Anesthesia Procedure Notes (Signed)
Procedure Name: MAC Date/Time: 01/14/2022 9:23 AM  Performed by: Carolan Clines, CRNAPre-anesthesia Checklist: Patient identified, Emergency Drugs available, Suction available and Patient being monitored Patient Re-evaluated:Patient Re-evaluated prior to induction Oxygen Delivery Method: Nasal cannula Dental Injury: Teeth and Oropharynx as per pre-operative assessment

## 2022-01-14 NOTE — Discharge Instructions (Signed)

## 2022-01-14 NOTE — Telephone Encounter (Signed)
Patient scheduled with Dr. West Bali  01/15/22. Patient's daughter [name] informed of appointment information and verbalized understanding

## 2022-01-14 NOTE — Transfer of Care (Signed)
Immediate Anesthesia Transfer of Care Note  Patient: Neville C Tomei  Procedure(s) Performed: TRANSESOPHAGEAL ECHOCARDIOGRAM (TEE) BUBBLE STUDY  Patient Location: Endoscopy Unit  Anesthesia Type:MAC  Level of Consciousness: awake, alert  and oriented  Airway & Oxygen Therapy: Patient Spontanous Breathing  Post-op Assessment: Report given to RN and Post -op Vital signs reviewed and stable  Post vital signs: Reviewed and stable  Last Vitals:  Vitals Value Taken Time  BP    Temp    Pulse 48 01/14/22 1021  Resp 13 01/14/22 1021  SpO2 95 % 01/14/22 1021  Vitals shown include unvalidated device data.  Last Pain:  Vitals:   01/14/22 0749  TempSrc: Temporal  PainSc: 0-No pain         Complications: No notable events documented.

## 2022-01-14 NOTE — Progress Notes (Signed)
  Echocardiogram Echocardiogram Transesophageal has been performed.  [name] [name] 01/14/2022, 10:40 AM

## 2022-01-14 NOTE — Telephone Encounter (Signed)
-----   Message from Thayer Headings, MD sent at 01/14/2022  4:37 PM EDT ----- Can you have her follow up in clinic tomorrow or so with someone for concern for line infection? thanks

## 2022-01-14 NOTE — Anesthesia Preprocedure Evaluation (Signed)
Anesthesia Evaluation  Patient identified by MRN, date of birth, ID band Patient awake    Reviewed: Allergy & Precautions, H&P , NPO status , Patient's Chart, lab work & pertinent test results  Airway Mallampati: II   Neck ROM: full    Dental   Pulmonary neg pulmonary ROS,    breath sounds clear to auscultation       Cardiovascular +CHF and + DVT  + Valvular Problems/Murmurs MR  Rhythm:regular Rate:Normal  EF 25-30%   Neuro/Psych CVA    GI/Hepatic GERD  ,  Endo/Other    Renal/GU      Musculoskeletal  (+) Arthritis ,   Abdominal   Peds  Hematology   Anesthesia Other Findings   Reproductive/Obstetrics                             Anesthesia Physical Anesthesia Plan  ASA: 3  Anesthesia Plan: MAC   Post-op Pain Management:    Induction: Intravenous  PONV Risk Score and Plan: 2 and Propofol infusion and Treatment may vary due to age or medical condition  Airway Management Planned: Nasal Cannula  Additional Equipment:   Intra-op Plan:   Post-operative Plan:   Informed Consent: I have reviewed the patients History and Physical, chart, labs and discussed the procedure including the risks, benefits and alternatives for the proposed anesthesia with the patient or authorized representative who has indicated his/her understanding and acceptance.     Dental advisory given  Plan Discussed with: CRNA, Anesthesiologist and Surgeon  Anesthesia Plan Comments:         Anesthesia Quick Evaluation

## 2022-01-14 NOTE — Procedures (Signed)
Interventional Radiology Procedure:   Indications: Chronic central line for TPN.  Recent fever and chills.  Procedure: Exchange of tunneled central line  Findings: New single lumen Powerline, length 31 cm, tip in right atrium.  Complications: No immediate complications noted.     EBL: Minimal  Plan: Central line is ready to use.    Erla Bacchi R. Anselm Pancoast, MD  Pager: [phone]

## 2022-01-14 NOTE — Interval H&P Note (Signed)
History and Physical Interval Note:  01/14/2022 7:49 AM  [name] [name]  has presented today for surgery, with the diagnosis of MITRAL REGURGITATION.  The various methods of treatment have been discussed with the patient and family. After consideration of risks, benefits and other options for treatment, the patient has consented to  Procedure(s): TRANSESOPHAGEAL ECHOCARDIOGRAM (TEE) (N/A) as a surgical intervention.  The patient's history has been reviewed, patient examined, no change in status, stable for surgery.  I have reviewed the patient's chart and labs.  Questions were answered to the patient's satisfaction.     [name]

## 2022-01-14 NOTE — CV Procedure (Signed)
INDICATIONS: MR, concern for line infection  PROCEDURE:   Informed consent was obtained prior to the procedure. The risks, benefits and alternatives for the procedure were discussed and the patient comprehended these risks.  Risks include, but are not limited to, cough, sore throat, vomiting, nausea, somnolence, esophageal and stomach trauma or perforation, bleeding, low blood pressure, aspiration, pneumonia, infection, trauma to the teeth and death.    After a procedural time-out, the oropharynx was anesthetized with 20% benzocaine spray.   During this procedure the patient was administered propofol per anesthesia.  The patient's heart rate, blood pressure, and oxygen saturation were monitored continuously during the procedure. The period of conscious sedation was 40 minutes, of which I was present face-to-face 100% of this time.  The transesophageal probe was inserted in the esophagus and stomach without difficulty and multiple views were obtained.  The patient was kept under observation until the patient left the procedure room.  The patient left the procedure room in stable condition.   Agitated microbubble saline contrast was administered.  COMPLICATIONS:    There were no immediate complications.  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  No definite line infection.  Moderate AI with mild AS.  Please see formal report for final details.   RECOMMENDATIONS:    Pt will be directed to IR for evaluation of her indwelling catheter.  Time Spent Directly with the Patient:  75 minutes   Jkayla Spiewak A Margaretann Loveless 01/14/2022, 10:41 AM

## 2022-01-14 NOTE — Progress Notes (Addendum)
Patient transported to IR from endo and will be discharged from there. Handoff given to IR RN, Whitney.

## 2022-01-15 ENCOUNTER — Other Ambulatory Visit: Payer: Self-pay

## 2022-01-15 ENCOUNTER — Telehealth: Payer: Self-pay

## 2022-01-15 ENCOUNTER — Ambulatory Visit (INDEPENDENT_AMBULATORY_CARE_PROVIDER_SITE_OTHER): Payer: Medicare Other | Admitting: Infectious Diseases

## 2022-01-15 ENCOUNTER — Ambulatory Visit (INDEPENDENT_AMBULATORY_CARE_PROVIDER_SITE_OTHER): Payer: Medicare Other

## 2022-01-15 ENCOUNTER — Encounter: Payer: Self-pay | Admitting: Infectious Diseases

## 2022-01-15 VITALS — BP 101/66 | HR 56 | Temp 97.5°F | Ht 68.0 in | Wt 137.0 lb

## 2022-01-15 DIAGNOSIS — T80219A Unspecified infection due to central venous catheter, initial encounter: Secondary | ICD-10-CM

## 2022-01-15 DIAGNOSIS — Z79899 Other long term (current) drug therapy: Secondary | ICD-10-CM | POA: Diagnosis not present

## 2022-01-15 DIAGNOSIS — R6883 Chills (without fever): Secondary | ICD-10-CM | POA: Diagnosis not present

## 2022-01-15 MED ORDER — CEFTRIAXONE SODIUM 500 MG IJ SOLR: 500.0000 mg | Freq: Once | INTRAMUSCULAR | Status: DC

## 2022-01-15 MED ORDER — CEFTRIAXONE SODIUM 500 MG IJ SOLR
500.0000 mg | Freq: Once | INTRAMUSCULAR | Status: AC
  Administered 2022-01-15: 500 mg via INTRAMUSCULAR

## 2022-01-15 NOTE — Addendum Note (Signed)
Addended by: Lucie Leather D on: 01/15/2022 02:05 PM   Modules accepted: Orders

## 2022-01-15 NOTE — Telephone Encounter (Signed)
Patient has opat orders placed by Dr. West Bali. Sent a community message to [employer] to notify them of orders.

## 2022-01-15 NOTE — Progress Notes (Addendum)
Patient Active Problem List   Diagnosis Date Noted   Nonrheumatic mitral valve regurgitation    STEC (Shiga toxin-producing Escherichia coli) 07/01/2021   Streptococcal sepsis, unspecified ([place]) 06/30/2021   Left arm cellulitis 06/29/2021   Cellulitis of left breast 06/29/2021   Humerus fracture - right 06/29/2021   New onset a-fib ([place]) 06/29/2021   Carotid artery stenosis 10/31/2020   Macrocytic anemia 10/27/2020   NAFLD (nonalcoholic fatty liver disease) 09/21/2020   Stroke (cerebrum) ([place]) 07/25/2020   History of CVA (cerebrovascular accident) 07/24/2020   Thrombocytopenia ([place]) 07/24/2020   Chronic renal failure, stage 3a ([place]) 07/24/2020   Pressure injury of skin 07/24/2020   Stroke ([place]) 07/23/2020   Carnitine deficiency ([place]) 05/25/2018   Hypotension 12/30/2017   Bradycardia 12/30/2017   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 07/31/2017   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[place]) 07/31/2017   Degenerative joint disease 07/08/2016   PICC line infection 05/16/2015   H/O mesenteric infarction 11/17/2013   Splenomegaly    Hx of adenomatous colonic polyps 16/55/3748   Chronic systolic heart failure (HCC) - LVEF 35-40% 12/25/2011   Hypokalemia 12/14/2011   Acute renal failure superimposed on stage 3a chronic kidney disease ([place]) 12/08/2011   Hyponatremia 12/08/2011   Osteoporosis 03/14/2010   Anemia of chronic disease 10/01/2009   GERD (gastroesophageal reflux disease) 11/19/2008   ALKALINE PHOSPHATASE, ELEVATED 10/12/2008   VITAMIN D DEFICIENCY 01/19/2008   TRANSAMINASES, SERUM, ELEVATED 01/19/2008   Bacterial overgrowth syndrome 11/19/2004   Acquired short bowel syndrome 11/20/2003    Patient's Medications  New Prescriptions   No medications on file  Previous Medications   ACETAMINOPHEN (TYLENOL) 500 MG TABLET    Take 1,000 mg by mouth every 6 (six) hours as needed for mild pain.   ADULT TPN    Inject 1,800 mLs into the vein 4 (four) times a week. Pt  receives home TPN from [facility]:  1800 mL bag, four nights weekly (Monday, Tuesday, Wednesday, Thursday for 8 hours (includes 1 hour taper up and down).   CALCIUM CARB-CHOLECALCIFEROL (CALCIUM + D3 PO)    Take 1 tablet by mouth in the morning and at bedtime. Morning & early evening.   CETIRIZINE (ZYRTEC) 10 MG TABLET    Take 10 mg by mouth daily as needed for allergies.   CLOBETASOL OINTMENT (TEMOVATE) 0.05 %    Apply 1 application  topically 2 (two) times daily as needed (irritation).   DIAZEPAM (VALIUM) 2 MG TABLET    Take 1 tablet (2 mg total) by mouth every 6 (six) hours as needed for anxiety. Take one tablet one hour prior to scan for claustrophobia.   DIPHENHYDRAMINE (BENADRYL) 25 MG CAPSULE    Take 25 mg by mouth every 6 (six) hours as needed for itching or sleep.   EMPAGLIFLOZIN (JARDIANCE) 10 MG TABS TABLET    Take 1 tablet (10 mg total) by mouth daily before breakfast.   ERGOCALCIFEROL (DRISDOL) 200 MCG/ML DROPS    Take 6 mLs (48,000 Units total) by mouth 3 (three) times a week.   FLUCONAZOLE (DIFLUCAN) 150 MG TABLET    Take 1 tablet today.  Take second tablet 3 days later.   FUROSEMIDE (LASIX) 40 MG TABLET    Take 2 tablets (80 mg total) by mouth daily.   HEPARIN SODIUM, PORCINE, (HEPARIN LOCK FLUSH IJ)    Inject 5 mLs as directed 4 (four) times a week. Monday, Tuesday, Wednesday, Thursday in the morning with TPN   LOSARTAN (COZAAR)  25 MG TABLET    Take 1 tablet (25 mg total) by mouth at bedtime.   OMEPRAZOLE (PRILOSEC) 40 MG CAPSULE    TAKE 1 CAPSULE BY MOUTH EVERY DAY BEFORE BREAKFAST   ONDANSETRON (ZOFRAN-ODT) 8 MG DISINTEGRATING TABLET    TAKE 1 TABLET BY MOUTH EVERY 8 HOURS AS NEEDED FOR NAUSEA OR VOMITING.   PEDIATRIC MULTIVIT-MINERALS (FLINTSTONES GUMMIES PO)    Take 4 tablets by mouth in the morning.   POTASSIUM CHLORIDE SA (KLOR-CON M20) 20 MEQ TABLET    Take 1 tablet (20 mEq total) by mouth daily.   SODIUM CHLORIDE 0.9 % INFUSION    Inject 900 mLs into the vein every 14  (fourteen) days. Uses for TPN   SPIRONOLACTONE (ALDACTONE) 25 MG TABLET    TAKE 1 TABLET (25 MG TOTAL) BY MOUTH DAILY.   TEDUGLUTIDE, RDNA, 5 MG KIT    Inject 3.31 Units into the skin every morning.   TRIAMCINOLONE CREAM (KENALOG) 0.1 %    Apply 1 application topically as needed (itching).   VITAMIN E 1000 UNIT CAPSULE    Take 1,000 Units by mouth in the morning.   XARELTO 20 MG TABS TABLET    TAKE 1 TABLET BY MOUTH DAILY WITH SUPPER.  Modified Medications   No medications on file  Discontinued Medications   No medications on file    Subjective: 71 year old female with PMH as below including CHF, CVA, splenomegaly, Short bowel syndrome status post bowel surgeries and TPN, GERD, Chronic diarrhea,  multiple prior bacteremia in the setting of PICC s/p exchange/removal ( Streptococcus pyogenes bacteremia in 06/2021, Klebsiella pneumonia bacteremia in 10/2020, Klebsiella pneumonia bacteremia in 11/2018 and 06/2016, Serratia marcescens bacteremia in 01/2015, 03/2014) who is here again with concerns for another left IJ powerline infection. Patient is accompanied by her husband.   Started having shaking chills 1 hr after infusion of TPN on Sunday. She stopped the TPN prematurely due to concern for line infection. She also had redness in the power line site but no tenderness and swelling. She could not come to the clinic on Sept 4 due to labor day and hence, came in today, She had her left IJ power line exchanged by IR yesterday and feels sore. She has some soreness at the powerline site. No known fevers but has mild chills. Denies nausea, vomiting or abdominal pain. No significant changes in her diarrhea except it has been more watery and frequent from her baseline, Mild cough but no chest pain and SOB. Denies GU symptoms or rashes. 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   Carnitine deficiency ([place]) 05/25/2018   Carotid stenosis    Carotid US (9/15):  R 40-59%; L 1-39% >> FU 1 year   Cataract    Closed left subtrochanteric femur fracture ([place]) 12/31/2017   Clotting disorder ([place])    Congestive heart failure (CHF) ([place])    EF 25-30%   COVID-19 2022  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   Fever 10/29/2020   Fracture of left clavicle    GERD (gastroesophageal reflux disease)    History of blood transfusion 2013   anemia   Hx of cardiovascular stress test    Myoview (9/15):  inf-apical scar; no ischemia; EF 47% - low risk    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 12/12/2011   Osteoporosis    Pancytopenia 10/07/2011   Pathologic fracture of neck of femur (HCC)    Personal history of colonic polyps    Renal insufficiency    hx of yrs ago   Serratia marcescens infection - bactermia assoc w/ PICC 01/18/2015   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HCC)    Splenomegaly    By ultrasound   Stroke (HCC) 07/2020   Thrombophilia (HCC)    Vitamin D deficiency    Past Surgical History:  Procedure Laterality Date   APPENDECTOMY  yrs ago   CHOLECYSTECTOMY  yrs ago   COLONOSCOPY  12/05/2005   internal hemorrhoids (for polyp surveillance)   COLONOSCOPY  04/26/2012   Procedure: COLONOSCOPY;  Surgeon: Iva Boop, MD;  Location: [facility];  Service: Endoscopy;  Laterality: N/A;   COLONOSCOPY WITH PROPOFOL N/A 03/18/2016   Procedure: COLONOSCOPY WITH PROPOFOL;  Surgeon: Iva Boop, MD;  Location: [facility];  Service: Endoscopy;   Laterality: N/A;   ESOPHAGOGASTRODUODENOSCOPY  01/22/2009   erosive esophagitis   HARDWARE REMOVAL Left 12/31/2017   Procedure: HARDWARE REMOVAL;  Surgeon: Samson Frederic, MD;  Location: [facility];  Service: Orthopedics;  Laterality: Left;   INTRAMEDULLARY (IM) NAIL INTERTROCHANTERIC Left 12/31/2017   Procedure: INTRAMEDULLARY (IM) NAIL SUBTROCHANTRIC;  Surgeon: Samson Frederic, MD;  Location: [facility];  Service: Orthopedics;  Laterality: Left;   IR CV LINE INJECTION  03/23/2018   IR CV LINE INJECTION  10/02/2021   IR FLUORO GUIDE CV LINE LEFT  01/01/2018   IR FLUORO GUIDE CV LINE LEFT  03/24/2018   IR FLUORO GUIDE CV LINE LEFT  05/21/2018   IR FLUORO GUIDE CV LINE LEFT  07/09/2018   IR FLUORO GUIDE CV LINE LEFT  12/28/2020   IR FLUORO GUIDE CV LINE LEFT  07/02/2021   IR FLUORO GUIDE CV LINE LEFT  01/14/2022   IR FLUORO GUIDE CV LINE RIGHT  11/01/2020   IR GENERIC HISTORICAL  07/04/2016   IR REMOVAL TUN CV CATH W/O FL 07/04/2016 Brayton El, PA-C [facility]   IR GENERIC HISTORICAL  07/10/2016   IR US GUIDE VASC ACCESS LEFT 07/10/2016 Oley Balm, MD [facility]   IR GENERIC HISTORICAL  07/10/2016   IR FLUORO GUIDE CV LINE LEFT 07/10/2016 Oley Balm, MD [facility]   IR PATIENT EVAL TECH 0-60 MINS  10/07/2017   IR PATIENT EVAL TECH 0-60 MINS  03/09/2018   IR RADIOLOGIST EVAL & MGMT  07/21/2017   IR RADIOLOGIST EVAL & MGMT  11/14/2020   IR RADIOLOGIST EVAL & MGMT  11/27/2020   IR RADIOLOGIST EVAL & MGMT  01/24/2021   IR REMOVAL TUN CV CATH W/O FL  12/28/2020   IR TRANSCATH PLC STENT  INITIAL VEIN  INC ANGIOPLASTY  12/28/2020   IR US GUIDE VASC ACCESS LEFT  11/01/2020   IR US GUIDE VASC ACCESS LEFT  12/28/2020   IR US GUIDE VASC ACCESS LEFT  12/28/2020   IR US GUIDE VASC ACCESS LEFT  12/28/2020   IR US GUIDE VASC ACCESS RIGHT  11/01/2020   IR VENO/EXT/UNI LEFT  11/01/2020   IR VENOCAVAGRAM SVC  12/28/2020   LEFT HEART CATH AND CORONARY ANGIOGRAPHY N/A 06/26/2020   Procedure: LEFT HEART CATH AND CORONARY  ANGIOGRAPHY;  Surgeon: Jolaine Artist, MD;  Location: [facility];  Service: Cardiovascular;  Laterality: N/A;   ORIF PROXIMAL FEMORAL FRACTURE W/ ITST NAIL SYSTEM  03/2007   left, Dr. Shellia Carwin   RIGHT HEART CATH N/A 01/01/2022   Procedure: RIGHT HEART CATH;  Surgeon: Jolaine Artist, MD;  Location: [facility];  Service: Cardiovascular;  Laterality: N/A;   SMALL INTESTINE SURGERY  2005   multiple with right colon resection for ischemia/infarct     Social History   Tobacco Use   Smoking status: Never   Smokeless tobacco: Never  Vaping Use   Vaping Use: Never used  Substance Use Topics   Alcohol use: Never   Drug use: Never    Family History  Problem Relation Age of Onset   Diabetes Mother    Hypertension Mother    AAA (abdominal aortic aneurysm) Mother    Colon cancer Neg Hx    Stomach cancer Neg Hx     Allergies  Allergen Reactions   Ivp Dye [Iodinated Contrast Media] Itching    Can use if taking benadryl     Health Maintenance  Topic Date Due   Hepatitis C Screening  Never done   Zoster Vaccines- Shingrix (1 of 2) Never done   COVID-19 Vaccine (3 - Pfizer series) 08/17/2019   INFLUENZA VACCINE  12/10/2021   COLONOSCOPY (Pts 45-57yrs Insurance coverage will need to be confirmed)  09/21/2022   TETANUS/TDAP  10/16/2030   Pneumonia Vaccine 78+ Years old  Completed   DEXA SCAN  Completed   HPV VACCINES  Aged Out   MAMMOGRAM  Discontinued    Objective:  Vitals:   01/15/22 0856  BP: 101/66  Pulse: (!) 56  Temp: (!) 97.5 F (36.4 C)  TempSrc: Oral  SpO2: 100%  Weight: 137 lb (62.1 kg)  Height: $Remove'5\' 8"'hcPgzqO$  (1.727 m)   Body mass index is 20.83 kg/m.  Physical Exam Constitutional:      Appearance: Normal appearance.  HENT:     Head: Normocephalic and atraumatic.      Mouth: Mucous membranes are moist.  Eyes:    Conjunctiva/sclera: Conjunctivae normal.     Pupils:   Cardiovascular:     Rate and Rhythm: Normal rate and regular  rhythm.     Heart sounds:  Pulmonary:     Effort: Pulmonary effort is normal.     Breath sounds: Normal breath sounds.   Abdominal:     General: Non distended     Palpations: soft.   Musculoskeletal:        General: Normal range of motion.   Skin:    General: Skin is warm and dry.     Comments: Left chest power line with a clean bandage C/D/I ( not opened per patient's request)  Neurological:     General: grossly non focal     Mental Status: awake, alert and oriented to person, place, and time.   Psychiatric:        Mood and Affect: Mood normal.   Lab Results Lab Results  Component Value Date   WBC 4.8 12/25/2021   HGB 12.6 01/14/2022   HCT 37.0 01/14/2022   MCV 94.1 12/25/2021   PLT 68 (L) 12/25/2021    Lab Results  Component Value Date  CREATININE 1.70 (H) 01/14/2022   BUN 23 01/14/2022   NA 139 01/14/2022   K 4.3 01/14/2022   CL 111 01/14/2022   CO2 25 01/01/2022    Lab Results  Component Value Date   ALT 18 12/25/2021   AST 26 12/25/2021   ALKPHOS 149 (H) 12/25/2021   BILITOT 0.7 12/25/2021    Lab Results  Component Value Date   CHOL 72 07/24/2020   HDL 17 (L) 07/24/2020   LDLCALC 20 07/24/2020   TRIG 57 12/25/2021   CHOLHDL 4.2 07/24/2020   No results found for: "LABRPR", "RPRTITER" No results found for: "HIV1RNAQUANT", "HIV1RNAVL", "CD4TABS"   Microbiology Results for orders placed or performed during the hospital encounter of 01/04/22  Urine Culture     Status: Abnormal   Collection Time: 01/04/22  1:39 PM   Specimen: Urine, Clean Catch  Result Value Ref Range Status   Specimen Description URINE, CLEAN CATCH  Final   Special Requests   Final    NONE Performed at [facility], [place] [address], [place], [place] [address]    Culture MULTIPLE SPECIES PRESENT, SUGGEST RECOLLECTION (A)  Final   Report Status 01/05/2022 FINAL  Final   *Note: Due to a large number of results and/or encounters for the requested time period, some results  have not been displayed. A complete set of results can be found in Results Review.   Radiology  IR Fluoro Guide CV Line Left  Result Date: 01/14/2022 INDICATION: 71 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 Patient has a chronic left jugular central line and limited central venous access. Patient recently had fevers and chills. Patient presents for tunneled catheter exchange with fluoroscopy. EXAM: Tunneled central venous catheter exchange with fluoroscopy MEDICATIONS: None ANESTHESIA/SEDATION: None FLUOROSCOPY: Radiation Exposure Index (as provided by the fluoroscopic device): 46 mGy Kerma COMPLICATIONS: None immediate. PROCEDURE: Informed consent was obtained for a tunneled central venous catheter exchange with fluoroscopy. Patient was placed supine on the interventional table. The left chest and catheter were prepped and draped in sterile fashion. Maximal barrier sterile technique was utilized including caps, mask, sterile gowns, sterile gloves, sterile drape, hand hygiene and skin antiseptic. Skin around the catheter exit site was anesthetized with 1% lidocaine. The cuff was exposed using blunt and sharp dissection. The old catheter tip was in the right atrium. The old catheter was easily removed over a stiff Glidewire. A new single-lumen Powerline catheter was cut to 31 cm. Catheter was easily advanced over the wire and tip positioned in the right atrium. The cuff was placed underneath the skin. The catheter was secured to the skin near the entrance site using Prolene suture. Catheter aspirated and flushed well. Heparinized saline was placed in the catheter and a dressing was placed. Fluoroscopic images were taken and saved for this procedure. FINDINGS: Left jugular central line with vascular stents in the left innominate vein and SVC. Catheter tip in the right atrium. IMPRESSION: Successful exchange of the tunneled central venous catheter with fluoroscopy. Electronically Signed   By: Markus Daft  M.D.   On: 01/14/2022 12:59   MR ANGIO HEAD WO CONTRAST  Result Date: 01/12/2022 CLINICAL DATA:  Follow-up examination for stroke, TIA common determine embolic source. EXAM: MRA NECK WITHOUT AND WITH CONTRAST MRA HEAD WITHOUT AND WITH CONTRAST TECHNIQUE: Multiplanar and multiecho pulse sequences of the neck were obtained without and with intravenous contrast. Angiographic images of the neck were obtained using MRA technique without and with intravenous contast.; Angiographic images of  the Circle of Willis were obtained using MRA technique without and with intravenous contrast. CONTRAST:  Please see contrast documentation. COMPARISON:  Comparison made with previous brain MRI from 12/20/2021. FINDINGS: MRA NECK FINDINGS AORTIC ARCH: Visualized aortic arch normal in caliber with standard 3 vessel morphology. No hemodynamically significant stenosis seen about the origin of the great vessels. RIGHT CAROTID SYSTEM: Right CCA widely patent without stenosis. Atheromatous change about the right carotid bulb/proximal right ICA with associated stenosis of up to 50% by NASCET criteria (series 103, image 19). Right ICA patent distally without additional stenosis or evidence for dissection. LEFT CAROTID SYSTEM: Left CCA patent from its origin to the bifurcation without stenosis. Atheromatous irregularity about the left carotid bulb/proximal left ICA with associated mild stenoses of up to 30% by NASCET criteria (series 104, image 19). Left ICA patent distally without additional stenosis or evidence for dissection. VERTEBRAL ARTERIES: Both vertebral arteries arise from the subclavian arteries. Focal approximate 40% stenosis noted at the origin of the right subclavian artery, proximal to the takeoff of the right vertebral artery (series 102, image 6). Proximal left subclavian artery widely patent. Focal mild stenosis noted at the origin of the left vertebral artery (series 102, image 11). Otherwise, vertebral arteries are widely  patent without stenosis or evidence for dissection. Left vertebral artery slightly dominant. Other: None. MRA HEAD FINDINGS ANTERIOR CIRCULATION: Visualized distal cervical segments of the internal carotid arteries are mildly tortuous but widely patent with antegrade flow. Petrous segments widely patent bilaterally. Mild atheromatous irregularity within the carotid siphons without hemodynamically significant stenosis. Origins of the ophthalmic arteries normal. A1 segments patent bilaterally. Normal anterior communicating artery complex. Anterior cerebral arteries patent without stenosis. No significant M1 stenosis. 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 Right MCA bifurcation normal. Distal MCA branches well perfused and symmetric. POSTERIOR CIRCULATION: Both vertebral arteries patent without stenosis. Both PICA patent at their origins. Basilar widely patent to its distal aspect without stenosis. Subtle 2 mm outpouching extending from the left aspect of the mid basilar artery suspicious for an additional small aneurysm (series 2, image 83). Superior cerebellar arteries patent bilaterally. Both PCAs primarily supplied via the basilar. PCAs are widely patent and well perfused to their distal aspects. Anatomic variants: None significant. IMPRESSION: MRA NECK IMPRESSION: 1. 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 2. Atheromatous irregularity about the left carotid bulb/proximal left ICA with associated stenoses of up to 30% by NASCET criteria. 3. 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 4. Mild stenosis at the origin of the left vertebral artery. Left vertebral artery slightly dominant. MRA HEAD IMPRESSION: 1. Wide patency of the intracranial arterial circulation. No large vessel occlusion. 2. Mild for age atheromatous irregularity about  the carotid siphons. No hemodynamically significant or correctable stenosis. 3. 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 4. 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 Electronically Signed   By: Jeannine Boga M.D.   On: 01/12/2022 07:12   MR ANGIO NECK W WO CONTRAST  Result Date: 01/12/2022 CLINICAL DATA:  Follow-up examination for stroke, TIA common determine embolic source. EXAM: MRA NECK WITHOUT AND WITH CONTRAST MRA HEAD WITHOUT AND WITH CONTRAST TECHNIQUE: Multiplanar and multiecho pulse sequences of the neck were obtained without and with intravenous contrast. Angiographic images of the neck were obtained using MRA technique  without and with intravenous contast.; Angiographic images of the Circle of Willis were obtained using MRA technique without and with intravenous contrast. CONTRAST:  Please see contrast documentation. COMPARISON:  Comparison made with previous brain MRI from 12/20/2021. FINDINGS: MRA NECK FINDINGS AORTIC ARCH: Visualized aortic arch normal in caliber with standard 3 vessel morphology. No hemodynamically significant stenosis seen about the origin of the great vessels. RIGHT CAROTID SYSTEM: Right CCA widely patent without stenosis. Atheromatous change about the right carotid bulb/proximal right ICA with associated stenosis of up to 50% by NASCET criteria (series 103, image 19). Right ICA patent distally without additional stenosis or evidence for dissection. LEFT CAROTID SYSTEM: Left CCA patent from its origin to the bifurcation without stenosis. Atheromatous irregularity about the left carotid bulb/proximal left ICA with associated mild stenoses of up to 30% by NASCET criteria (series 104, image 19). Left ICA patent distally without additional stenosis or evidence for dissection. VERTEBRAL ARTERIES: Both vertebral arteries arise from the subclavian arteries. Focal approximate 40% stenosis noted at the origin of the right subclavian artery,  proximal to the takeoff of the right vertebral artery (series 102, image 6). Proximal left subclavian artery widely patent. Focal mild stenosis noted at the origin of the left vertebral artery (series 102, image 11). Otherwise, vertebral arteries are widely patent without stenosis or evidence for dissection. Left vertebral artery slightly dominant. Other: None. MRA HEAD FINDINGS ANTERIOR CIRCULATION: Visualized distal cervical segments of the internal carotid arteries are mildly tortuous but widely patent with antegrade flow. Petrous segments widely patent bilaterally. Mild atheromatous irregularity within the carotid siphons without hemodynamically significant stenosis. Origins of the ophthalmic arteries normal. A1 segments patent bilaterally. Normal anterior communicating artery complex. Anterior cerebral arteries patent without stenosis. No significant M1 stenosis. 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 Right MCA bifurcation normal. Distal MCA branches well perfused and symmetric. POSTERIOR CIRCULATION: Both vertebral arteries patent without stenosis. Both PICA patent at their origins. Basilar widely patent to its distal aspect without stenosis. Subtle 2 mm outpouching extending from the left aspect of the mid basilar artery suspicious for an additional small aneurysm (series 2, image 83). Superior cerebellar arteries patent bilaterally. Both PCAs primarily supplied via the basilar. PCAs are widely patent and well perfused to their distal aspects. Anatomic variants: None significant. IMPRESSION: MRA NECK IMPRESSION: 1. 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 2. Atheromatous irregularity about the left carotid bulb/proximal left ICA with associated stenoses of up to 30% by NASCET criteria. 3. 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  4. Mild stenosis at the origin of the left vertebral artery. Left vertebral artery slightly dominant. MRA HEAD IMPRESSION: 1. Wide patency of the intracranial arterial circulation. No large vessel occlusion. 2. Mild for age atheromatous irregularity about the carotid siphons. No hemodynamically significant or correctable stenosis. 3. 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 4. 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 Electronically Signed   By: Jeannine Boga M.D.   On: 01/12/2022 07:12   CARDIAC CATHETERIZATION  Result Date: 01/01/2022 Findings: RA = 9 RV = 58/10 PA = 56/23 (37) R PCW = 27 (v = 39) L PCW = 25 (v = 31) Fick cardiac output/index = 4.8/2.7 Thermo CO/CI = 4.7/2.7 PVR = 2.10 WU Ao sat = 98% PA sat = 62%, 61%  Assessment: 1. Elevated left-sided filling pressures with mild to moderate pulmonary venous HTN and normal output 2. Prominent v-waves in PCWP tracing suggestive of significant MR Plan/Discussion: Increase diuretics. F/u with Structural Heart team re: mTEER. Will need TEE as well. Glori Bickers, MD 12:55 PM  MR BRAIN W WO CONTRAST  Result Date: 12/23/2021 CLINICAL DATA:  71 year old female with right side hearing loss and dizziness. 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 EXAM: MRI HEAD WITHOUT AND WITH CONTRAST TECHNIQUE: Multiplanar, multiecho pulse sequences of the brain and surrounding structures were obtained without and with intravenous contrast. CONTRAST:  11mL MULTIHANCE GADOBENATE DIMEGLUMINE 529 MG/ML IV SOLN COMPARISON:  Brain MRI 07/24/2020. FINDINGS: Brain: Suboptimal DWI. No restricted diffusion to suggest acute infarction. No midline shift, mass effect, evidence of mass lesion, ventriculomegaly, extra-axial collection or acute intracranial hemorrhage. Cervicomedullary junction and pituitary are within normal limits. But new developing encephalomalacia in the bilateral occipital poles, PCA territories since the MRI last  year (series 11, image 12). Some associated hemosiderin greater on the left. Chronic lacunar infarct of the left caudate is also new since last year. Other patchy and scattered bilateral cerebral white matter T2 and FLAIR hyperintensity not significantly changed. Chronic microhemorrhage in the posterior left temporal lobe is stable. Gyriform post ischemic enhancement in the occipital poles greater on the left. Brainstem and cerebellum appear to remain normal for age. No other abnormal intracranial enhancement identified. Vascular: Major intracranial vascular flow voids are stable since last year. Skull and upper cervical spine: Negative. Sinuses/Orbits: Postoperative changes to both globes since last year. Paranasal sinuses remain well aerated. Other: Dedicated internal auditory imaging. Normal cerebellopontine angles. Normal bilateral cisternal and intracanalicular 7th and 8th cranial nerve segments. No abnormal enhancement identified. Mastoids are clear. Normal stylomastoid foramina. No definite abnormality of the cochlea or vestibular structures. IMPRESSION: 1. Negative internal auditory imaging. 2. Multifocal cerebral ischemia with extension since the March brain MRI last year. This includes late subacute to early chronic appearing bilateral PCA infarcts with developing encephalomalacia now in both occipital poles. 3. No superimposed acute intracranial abnormality. Electronically Signed   By: Genevie Ann M.D.   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  No definite line infection.   Assessment/plan # Shaking chills with infusion of TPN through powerline with concerns for infection S/p exchange of powerline on 9/5 by IR. Unfortunately she has complicated vascular anatomy making line removal and holiday difficult for IR in the past  Blood cx 2 sets, CBC Will arrange for IV cefazolin from [facility] for 7 days pending blood  cx 1g of IM ceftriaxone at the office today while [facility] can set up IV abtx tomorrow.  Fu final TEE reports Fu in a week   OPAT Diagnosis: powerline infection   Culture Result: negative  Allergies  Allergen Reactions   Ivp Dye [Iodinated Contrast Media] Itching    Can use if taking benadryl     OPAT Orders Discharge antibiotics to be given via PICC line Discharge antibiotics: IV cefazolin 2 g iv q8 hrs  Per pharmacy protocol  Duration: 7 days from start day  [facility] Care Per Protocol:  Home health RN for IV administration and teaching; PICC line care and labs.    Labs weekly while on IV antibiotics: X__ CBC with differential X__ BMP __ CMP __ CRP __ ESR __ Vancomycin trough __ CK  X__ Please pull PIC at completion of  IV antibiotics __ Please leave PIC in place until doctor has seen patient or been notified  Fax weekly labs to [phone]  Clinic Follow Up Appt: 1 week    I have personally spent 65  minutes involved in face-to-face and non-face-to-face activities for this patient on the day of the visit. Professional time spent includes the following activities: Preparing to see the patient (review of tests), Obtaining and/or reviewing separately obtained history (admission/discharge record), Performing a medically appropriate examination and/or evaluation , Ordering medications/tests/procedures, referring and communicating with other health care professionals, Documenting clinical information in the EMR, Independently interpreting results (not separately reported), Communicating results to the patient/family/caregiver, Counseling and educating the patient/family/caregiver and Care coordination (not separately reported).   Wilber Oliphant, [facility] 01/15/2022, 1:06 PM

## 2022-01-16 ENCOUNTER — Telehealth: Payer: Self-pay

## 2022-01-16 ENCOUNTER — Encounter: Payer: Self-pay | Admitting: Infectious Diseases

## 2022-01-16 ENCOUNTER — Encounter: Payer: Self-pay | Admitting: Internal Medicine

## 2022-01-16 ENCOUNTER — Inpatient Hospital Stay (HOSPITAL_BASED_OUTPATIENT_CLINIC_OR_DEPARTMENT_OTHER): Payer: Medicare Other | Admitting: Internal Medicine

## 2022-01-16 ENCOUNTER — Telehealth: Payer: Self-pay | Admitting: Internal Medicine

## 2022-01-16 ENCOUNTER — Inpatient Hospital Stay: Payer: Medicare Other

## 2022-01-16 ENCOUNTER — Other Ambulatory Visit: Payer: Self-pay

## 2022-01-16 ENCOUNTER — Inpatient Hospital Stay: Payer: Medicare Other | Attending: Hematology and Oncology

## 2022-01-16 VITALS — BP 114/65 | HR 66 | Temp 97.0°F | Resp 14 | Wt 136.4 lb

## 2022-01-16 DIAGNOSIS — Z8673 Personal history of transient ischemic attack (TIA), and cerebral infarction without residual deficits: Secondary | ICD-10-CM | POA: Diagnosis not present

## 2022-01-16 DIAGNOSIS — D638 Anemia in other chronic diseases classified elsewhere: Secondary | ICD-10-CM | POA: Diagnosis not present

## 2022-01-16 DIAGNOSIS — D539 Nutritional anemia, unspecified: Secondary | ICD-10-CM

## 2022-01-16 DIAGNOSIS — I639 Cerebral infarction, unspecified: Secondary | ICD-10-CM

## 2022-01-16 DIAGNOSIS — I110 Hypertensive heart disease with heart failure: Secondary | ICD-10-CM | POA: Diagnosis not present

## 2022-01-16 DIAGNOSIS — G9389 Other specified disorders of brain: Secondary | ICD-10-CM | POA: Diagnosis not present

## 2022-01-16 DIAGNOSIS — Z7901 Long term (current) use of anticoagulants: Secondary | ICD-10-CM | POA: Insufficient documentation

## 2022-01-16 DIAGNOSIS — I509 Heart failure, unspecified: Secondary | ICD-10-CM | POA: Diagnosis not present

## 2022-01-16 DIAGNOSIS — K912 Postsurgical malabsorption, not elsewhere classified: Secondary | ICD-10-CM

## 2022-01-16 LAB — CBC WITH DIFFERENTIAL (CANCER CENTER ONLY)
Abs Immature Granulocytes: 0.01 10*3/uL (ref 0.00–0.07)
Basophils Absolute: 0 10*3/uL (ref 0.0–0.1)
Basophils Relative: 1 %
Eosinophils Absolute: 0.2 10*3/uL (ref 0.0–0.5)
Eosinophils Relative: 4 %
HCT: 33.3 % — ABNORMAL LOW (ref 36.0–46.0)
Hemoglobin: 10.9 g/dL — ABNORMAL LOW (ref 12.0–15.0)
Immature Granulocytes: 0 %
Lymphocytes Relative: 27 %
Lymphs Abs: 1.6 10*3/uL (ref 0.7–4.0)
MCH: 30 pg (ref 26.0–34.0)
MCHC: 32.7 g/dL (ref 30.0–36.0)
MCV: 91.7 fL (ref 80.0–100.0)
Monocytes Absolute: 0.8 10*3/uL (ref 0.1–1.0)
Monocytes Relative: 14 %
Neutro Abs: 3.2 10*3/uL (ref 1.7–7.7)
Neutrophils Relative %: 54 %
Platelet Count: 120 10*3/uL — ABNORMAL LOW (ref 150–400)
RBC: 3.63 MIL/uL — ABNORMAL LOW (ref 3.87–5.11)
RDW: 15.8 % — ABNORMAL HIGH (ref 11.5–15.5)
WBC Count: 5.8 10*3/uL (ref 4.0–10.5)
nRBC: 0 % (ref 0.0–0.2)

## 2022-01-16 LAB — CMP (CANCER CENTER ONLY)
ALT: 13 U/L (ref 0–44)
AST: 16 U/L (ref 15–41)
Albumin: 3.3 g/dL — ABNORMAL LOW (ref 3.5–5.0)
Alkaline Phosphatase: 170 U/L — ABNORMAL HIGH (ref 38–126)
Anion gap: 8 (ref 5–15)
BUN: 20 mg/dL (ref 8–23)
CO2: 14 mmol/L — ABNORMAL LOW (ref 22–32)
Calcium: 8.5 mg/dL — ABNORMAL LOW (ref 8.9–10.3)
Chloride: 112 mmol/L — ABNORMAL HIGH (ref 98–111)
Creatinine: 1.6 mg/dL — ABNORMAL HIGH (ref 0.44–1.00)
GFR, Estimated: 34 mL/min — ABNORMAL LOW (ref >60)
Glucose, Bld: 84 mg/dL (ref 70–99)
Potassium: 3.5 mmol/L (ref 3.5–5.1)
Sodium: 134 mmol/L — ABNORMAL LOW (ref 135–145)
Total Bilirubin: 0.4 mg/dL (ref 0.3–1.2)
Total Protein: 8.6 g/dL — ABNORMAL HIGH (ref 6.5–8.1)

## 2022-01-16 LAB — MAGNESIUM: Magnesium: 1.7 mg/dL (ref 1.7–2.4)

## 2022-01-16 LAB — TRIGLYCERIDES: Triglycerides: 153 mg/dL — ABNORMAL HIGH (ref <150)

## 2022-01-16 LAB — PREALBUMIN: Prealbumin: 16 mg/dL — ABNORMAL LOW (ref 18–38)

## 2022-01-16 LAB — PHOSPHORUS: Phosphorus: 4.2 mg/dL (ref 2.5–4.6)

## 2022-01-16 NOTE — Progress Notes (Signed)
Patient was here for aranesp hgb was 10.9 she did not meet the parameters, so she did not get it.  Suzzette Righter, CMA

## 2022-01-16 NOTE — Telephone Encounter (Signed)
Patient called requesting to speak with you regarding a line infection. Asked for a call back when possible.

## 2022-01-16 NOTE — Telephone Encounter (Signed)
Dr. West Bali has placed new orders and changed IV antibiotic to cefepime. Sent new orders to Carolynn Sayers, RN with [facility].   Updated patient regarding change.   Patient has tolerated cephalosporins previously, no need for short stay.   Beryle Flock, RN

## 2022-01-16 NOTE — Telephone Encounter (Signed)
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 The fossa looks approachable for transseptal puncture in the SAXB and Bicaval views. LA dimensions are large enough for device steering and straddle. The MR is caused by a lack of coaptation and is centrally located. The posterior leaflet measures between 8-58m in the LVOT degree grasping view. MVA is not measured, gradient measures 133mg at a HR of 52bpm. I'd like to verify both in the procedure prior to the start and recommend starting with an NTW placed at A2/P2 & assessing residual.  *Questionable cleft on medial aspect of anterior leaflet"

## 2022-01-16 NOTE — Telephone Encounter (Signed)
Jiles Garter with [name] called requesting documentation that patient has received cefepime previously. Faxed MAR administration record.   Jiles Garter Phone: [phone]  ceFEPIme (MAXIPIME) 2 g in sodium chloride 0.9 % 100 mL IVPB   [[id number]]  Ordered Dose: 2 g Route: Intravenous Frequency:  Once @ 200 mL/hr over 30 Minutes  Volume: 100 mL     Scheduled Start Date/Time: 12/09/21 1730 End Date/Time: 12/09/21 1922 after 1 doses     Admin Instructions:  Dissolve Powder!   Expires 24 hours after mixing.        Order Status: Completed Mon Dec 09, 2021 1922, originally scheduled to end    Ordering User: Bertis Ruddy, [facility] Ordering Date/Time: Mon Dec 09, 2021 1721  Ordering Provider: Bertis Ruddy, [place] Provider: Isla Pence, MD    Components  Component Order Dose Admin Dose  ceFEPIme 2 g Solr 2 g 2 g  sodium chloride 0.9 % Soln 100 mLs 100 mL  Component Details      Order Questions  Question Answer Comment  Antibiotic Indication: Sepsis   Other Indication          Beryle Flock, RN

## 2022-01-16 NOTE — Progress Notes (Signed)
[facility] [place], [place] [address] [phone]   New Patient Evaluation  Date of Service: 01/16/22 Patient Name: [name] [name] Patient MRN: [id number] Patient DOB: [birth year] Provider: Ventura Sellers, MD  Identifying Statement:  [name] [name] is a 72 y.o. female with Cerebrovascular accident (CVA), unspecified mechanism ([place]) who presents for initial consultation and evaluation regarding cancer associated neurologic deficits.    Referring Provider: [name], MD [address] [place],  [place] [address]  Primary Cancer:  Oncologic History: Oncology History   No history exists.    History of Present Illness: The patient's records from the referring physician were obtained and reviewed and the patient interviewed to confirm this HPI.  [name] [name] presents today for evaluation after recent abnormal findings on MRI brain last month.  MRI was performed initially because of tinnitus and hearing complaints, which have been present for over a year.  She denies any acute/new focal neurologic symptoms, and none at all since her stroke(s) in March of 2022.  At that time she had presented with slurred speech and left sided weakness.  She has had multiple hospitalizations and ED visits in recent months for sepsis, line infection, CHF exacerbation.  She is currently getting Aranesp injections through Dr. Lindi Adie for anemia of chronic disease.  Explicitly denies visual complaints aside from chronic blurriness in right eye only.  Medications: Current Outpatient Medications on File Prior to Visit  Medication Sig Dispense Refill   acetaminophen (TYLENOL) 500 MG tablet Take 1,000 mg by mouth every 6 (six) hours as needed for mild pain.     ADULT TPN Inject 1,800 mLs into the vein 4 (four) times a week. Pt receives home TPN from [facility]:  1800 mL bag, four nights weekly (Monday, Tuesday, Wednesday, Thursday for 8 hours (includes 1  hour taper up and down).     Calcium Carb-Cholecalciferol (CALCIUM + D3 PO) Take 1 tablet by mouth in the morning and at bedtime. Morning & early evening.     cetirizine (ZYRTEC) 10 MG tablet Take 10 mg by mouth daily as needed for allergies.     clobetasol ointment (TEMOVATE) 8.76 % Apply 1 application  topically 2 (two) times daily as needed (irritation).     diazepam (VALIUM) 2 MG tablet Take 1 tablet (2 mg total) by mouth every 6 (six) hours as needed for anxiety. Take one tablet one hour prior to scan for claustrophobia. (Patient not taking: Reported on 01/15/2022) 1 tablet 0   diphenhydrAMINE (BENADRYL) 25 mg capsule Take 25 mg by mouth every 6 (six) hours as needed for itching or sleep.     empagliflozin (JARDIANCE) 10 MG TABS tablet Take 1 tablet (10 mg total) by mouth daily before breakfast. 90 tablet 3   ergocalciferol (DRISDOL) 200 MCG/ML drops Take 6 mLs (48,000 Units total) by mouth 3 (three) times a week. 720 mL 3   fluconazole (DIFLUCAN) 150 MG tablet Take 1 tablet today.  Take second tablet 3 days later. 2 tablet 0   furosemide (LASIX) 40 MG tablet Take 2 tablets (80 mg total) by mouth daily. (Patient taking differently: Take 40 mg by mouth 2 (two) times daily. Morning & early afternoon.) 180 tablet 3   Heparin Sodium, Porcine, (HEPARIN LOCK FLUSH IJ) Inject 5 mLs as directed 4 (four) times a week. Monday, Tuesday, Wednesday, Thursday in the morning with TPN     losartan (COZAAR) 25 MG  tablet Take 1 tablet (25 mg total) by mouth at bedtime. (Patient taking differently: Take 12.5 mg by mouth at bedtime.) 90 tablet 3   omeprazole (PRILOSEC) 40 MG capsule TAKE 1 CAPSULE BY MOUTH EVERY DAY BEFORE BREAKFAST 90 capsule 1   ondansetron (ZOFRAN-ODT) 8 MG disintegrating tablet TAKE 1 TABLET BY MOUTH EVERY 8 HOURS AS NEEDED FOR NAUSEA OR VOMITING. 30 tablet 11   Pediatric Multivit-Minerals (FLINTSTONES GUMMIES PO) Take 4 tablets by mouth in the morning.     potassium chloride SA (KLOR-CON M20) 20  MEQ tablet Take 1 tablet (20 mEq total) by mouth daily. 90 tablet 3   sodium chloride 0.9 % infusion Inject 900 mLs into the vein every 14 (fourteen) days. Uses for TPN     spironolactone (ALDACTONE) 25 MG tablet TAKE 1 TABLET (25 MG TOTAL) BY MOUTH DAILY. (Patient taking differently: Take 25 mg by mouth in the morning.) 90 tablet 3   Teduglutide, rDNA, 5 MG KIT Inject 3.31 Units into the skin every morning. 1 kit 0   triamcinolone cream (KENALOG) 0.1 % Apply 1 application topically as needed (itching).     vitamin E 1000 UNIT capsule Take 1,000 Units by mouth in the morning.     XARELTO 20 MG TABS tablet TAKE 1 TABLET BY MOUTH DAILY WITH SUPPER. 90 tablet 2   No current facility-administered medications on file prior to visit.    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   Carnitine deficiency ([facility]) 05/25/2018   Carotid stenosis    Carotid US (9/15):  R 40-59%; L 1-39% >> FU 1 year   Cataract    Closed left subtrochanteric femur fracture (HCC) 12/31/2017   Clotting disorder (HCC)    Congestive heart failure (CHF) (HCC)    EF 25-30%   COVID-19 2022   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   Fever 10/29/2020   Fracture of left clavicle    GERD (gastroesophageal reflux disease)    History of blood transfusion 2013   anemia   Hx of cardiovascular stress test    Myoview (9/15):  inf-apical scar; no ischemia; EF 47% - low risk    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 12/12/2011   Osteoporosis    Pancytopenia 10/07/2011   Pathologic  fracture of neck of femur ([facility])    Personal history of colonic polyps    Renal insufficiency    hx of yrs ago   Serratia marcescens infection - bactermia assoc w/ PICC 01/18/2015   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[facility]CC)    Splenomegaly    By ultrasound   Stroke ([facility]) 07/2020   Thrombophilia ([facility])    Vitamin D deficiency    Past Surgical History:  Past Surgical History:  Procedure Laterality Date   APPENDECTOMY  yrs ago   BUBBLE STUDY  01/14/2022   Procedure: BUBBLE STUDY;  Surgeon: [name], MD;  Location: [facility];  Service:  Cardiology;;   CHOLECYSTECTOMY  yrs ago   COLONOSCOPY  12/05/2005   internal hemorrhoids (for polyp surveillance)   COLONOSCOPY  04/26/2012   Procedure: COLONOSCOPY;  Surgeon: Gatha Mayer, MD;  Location: [facility];  Service: Endoscopy;  Laterality: N/A;   COLONOSCOPY WITH PROPOFOL N/A 03/18/2016   Procedure: COLONOSCOPY WITH PROPOFOL;  Surgeon: Gatha Mayer, MD;  Location: [facility];  Service: Endoscopy;  Laterality: N/A;   ESOPHAGOGASTRODUODENOSCOPY  01/22/2009   erosive esophagitis   HARDWARE REMOVAL Left 12/31/2017   Procedure: HARDWARE REMOVAL;  Surgeon: Rod Can, MD;  Location: [facility];  Service: Orthopedics;  Laterality: Left;   INTRAMEDULLARY (IM) NAIL INTERTROCHANTERIC Left 12/31/2017   Procedure: INTRAMEDULLARY (IM) NAIL SUBTROCHANTRIC;  Surgeon: Rod Can, MD;  Location: [facility];  Service: Orthopedics;  Laterality: Left;   IR CV LINE INJECTION  03/23/2018   IR CV LINE INJECTION  10/02/2021   IR FLUORO GUIDE CV LINE LEFT  01/01/2018   IR FLUORO GUIDE CV LINE LEFT  03/24/2018   IR FLUORO GUIDE CV LINE LEFT  05/21/2018   IR FLUORO GUIDE CV LINE LEFT  07/09/2018   IR FLUORO GUIDE CV LINE LEFT  12/28/2020   IR FLUORO GUIDE CV LINE LEFT  07/02/2021   IR FLUORO GUIDE CV LINE LEFT  01/14/2022   IR FLUORO GUIDE CV LINE RIGHT  11/01/2020   IR GENERIC HISTORICAL  07/04/2016   IR REMOVAL TUN CV  CATH W/O FL 07/04/2016 Ascencion Dike, PA-C [facility]   IR GENERIC HISTORICAL  07/10/2016   IR US GUIDE VASC ACCESS LEFT 07/10/2016 Arne Cleveland, MD [facility]   IR GENERIC HISTORICAL  07/10/2016   IR FLUORO GUIDE CV LINE LEFT 07/10/2016 Arne Cleveland, MD [facility]   IR PATIENT EVAL TECH 0-60 MINS  10/07/2017   IR PATIENT EVAL TECH 0-60 MINS  03/09/2018   IR RADIOLOGIST EVAL & MGMT  07/21/2017   IR RADIOLOGIST EVAL & MGMT  11/14/2020   IR RADIOLOGIST EVAL & MGMT  11/27/2020   IR RADIOLOGIST EVAL & MGMT  01/24/2021   IR REMOVAL TUN CV CATH W/O FL  12/28/2020   IR TRANSCATH PLC STENT  INITIAL VEIN  INC ANGIOPLASTY  12/28/2020   IR US GUIDE VASC ACCESS LEFT  11/01/2020   IR US GUIDE VASC ACCESS LEFT  12/28/2020   IR US GUIDE VASC ACCESS LEFT  12/28/2020   IR US GUIDE VASC ACCESS LEFT  12/28/2020   IR US GUIDE VASC ACCESS RIGHT  11/01/2020   IR VENO/EXT/UNI LEFT  11/01/2020   IR VENOCAVAGRAM SVC  12/28/2020   LEFT HEART CATH AND CORONARY ANGIOGRAPHY N/A 06/26/2020   Procedure: LEFT HEART CATH AND CORONARY ANGIOGRAPHY;  Surgeon: Jolaine Artist, MD;  Location: [facility];  Service: Cardiovascular;  Laterality: N/A;   ORIF PROXIMAL FEMORAL FRACTURE W/ ITST NAIL SYSTEM  03/2007   left, Dr. Shellia Carwin   RIGHT HEART CATH N/A 01/01/2022   Procedure: RIGHT HEART CATH;  Surgeon: Jolaine Artist, MD;  Location: [facility];  Service: Cardiovascular;  Laterality: N/A;   SMALL INTESTINE SURGERY  2005   multiple with right colon resection for ischemia/infarct   TEE WITHOUT CARDIOVERSION N/A 01/14/2022   Procedure: TRANSESOPHAGEAL ECHOCARDIOGRAM (TEE);  Surgeon: [name], MD;  Location: [facility];  Service: Cardiology;  Laterality: N/A;   Social History:  Social History   Socioeconomic History   Marital status: Married    Spouse name: Not on file  Number of children: 1   Years of education: Not on file   Highest education level: Not on file  Occupational History   Not on  file  Tobacco Use   Smoking status: Never   Smokeless tobacco: Never  Vaping Use   Vaping Use: Never used  Substance and Sexual Activity   Alcohol use: Never   Drug use: Never   Sexual activity: Yes    Comment: 1st intercourse 58 yo-5 partners  Other Topics Concern   Not on file  Social History Narrative   ** Merged History Encounter **       Married to Herbie [place], has 1 daughter and a granddaughter the patient's son died in childhood due to a motor vehicle wreck 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 No alcohol tobacco or drug use 04/07/2017    Social Determinants of Health   Financial Resource Strain: Medium Risk (12/30/2017)   Overall Financial Resource Strain (CARDIA)    Difficulty of Paying Living Expenses: Somewhat hard  Food Insecurity: No Food Insecurity (12/30/2017)   Hunger Vital Sign    Worried About Running Out of Food in the Last Year: Never true    [place] in the Last Year: Never true  Transportation Needs: No Transportation Needs (04/13/2020)   PRAPARE - Hydrologist (Medical): No    Lack of Transportation (Non-Medical): No  Physical Activity: Insufficiently Active (12/30/2017)   Exercise Vital Sign    Days of Exercise per Week: 7 days    Minutes of Exercise per Session: 20 min  Stress: No Stress Concern Present (12/30/2017)   [place]    Feeling of Stress : Not at all  Social Connections: Moderately Integrated (12/30/2017)   Social Connection and Isolation Panel [NHANES]    Frequency of Communication with Friends and Family: More than three times a week    Frequency of Social Gatherings with Friends and Family: Three times a week    Attends Religious Services: 1 to 4 times per year    Active Member of Clubs or Organizations: No    Attends Music therapist: Never    Marital Status: Married  Human resources officer  Violence: Not on file   Family History:  Family History  Problem Relation Age of Onset   Diabetes Mother    Hypertension Mother    AAA (abdominal aortic aneurysm) Mother    Colon cancer Neg Hx    Stomach cancer Neg Hx     Review of Systems: Constitutional: Doesn't report fevers, chills or abnormal weight loss Eyes: Doesn't report blurriness of vision Ears, nose, mouth, throat, and face: Doesn't report sore throat Respiratory: Doesn't report cough, dyspnea or wheezes Cardiovascular: Doesn't report palpitation, chest discomfort  Gastrointestinal:  Doesn't report nausea, constipation, diarrhea GU: Doesn't report incontinence Skin: Doesn't report skin rashes Neurological: Per HPI Musculoskeletal: Doesn't report joint pain Behavioral/Psych: Doesn't report anxiety  Physical Exam: Vitals:   01/16/22 1524  BP: 114/65  Pulse: 66  Resp: 14  Temp: (!) 97 F (36.1 C)  SpO2: 96%   KPS: 80. General: Alert, cooperative, pleasant, in no acute distress Head: Normal EENT: No conjunctival injection or scleral icterus.  Lungs: Resp effort normal Cardiac: Regular rate Abdomen: Non-distended abdomen Skin: No rashes cyanosis or petechiae. Extremities: No clubbing or edema  Neurologic Exam: Mental Status: Awake, alert, attentive to examiner. Oriented to self and environment. Language is  fluent with intact comprehension.  Cranial Nerves: Visual acuity is grossly normal. Visual fields are full. Extra-ocular movements intact. No ptosis. Face is symmetric Motor: Tone and bulk are normal. Power is full in both arms and legs. Reflexes are symmetric, no pathologic reflexes present.  Sensory: Intact to light touch Gait: Normal.   Labs: I have reviewed the data as listed    Component Value Date/Time   NA 134 (L) 01/16/2022 1454   NA 139 01/13/2014 0951   K 3.5 01/16/2022 1454   K 3.6 01/13/2014 0951   CL 112 (H) 01/16/2022 1454   CL 106 10/22/2012 1005   CO2 14 (L) 01/16/2022 1454    CO2 21 (L) 01/13/2014 0951   GLUCOSE 84 01/16/2022 1454   GLUCOSE 95 01/13/2014 0951   GLUCOSE 95 10/22/2012 1005   BUN 20 01/16/2022 1454   BUN 14.6 01/13/2014 0951   CREATININE 1.60 (H) 01/16/2022 1454   CREATININE 1.0 01/13/2014 0951   CALCIUM 8.5 (L) 01/16/2022 1454   CALCIUM 8.9 01/13/2014 0951   PROT 8.6 (H) 01/16/2022 1454   PROT 8.1 01/13/2014 0951   ALBUMIN 3.3 (L) 01/16/2022 1454   ALBUMIN 3.4 (L) 01/13/2014 0951   AST 16 01/16/2022 1454   AST 19 01/13/2014 0951   ALT 13 01/16/2022 1454   ALT 20 01/13/2014 0951   ALKPHOS 170 (H) 01/16/2022 1454   ALKPHOS 85 01/13/2014 0951   BILITOT 0.4 01/16/2022 1454   BILITOT 0.26 01/13/2014 0951   GFRNONAA 34 (L) 01/16/2022 1454   GFRAA 59 (L) 12/08/2018 2031   Lab Results  Component Value Date   WBC 5.8 01/16/2022   NEUTROABS 3.2 01/16/2022   HGB 10.9 (L) 01/16/2022   HCT 33.3 (L) 01/16/2022   MCV 91.7 01/16/2022   PLT 120 (L) 01/16/2022    Imaging:  IR Fluoro Guide CV Line Left  Result Date: 01/14/2022 INDICATION: 71 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 Patient has a chronic left jugular central line and limited central venous access. Patient recently had fevers and chills. Patient presents for tunneled catheter exchange with fluoroscopy. EXAM: Tunneled central venous catheter exchange with fluoroscopy MEDICATIONS: None ANESTHESIA/SEDATION: None FLUOROSCOPY: Radiation Exposure Index (as provided by the fluoroscopic device): 46 mGy Kerma COMPLICATIONS: None immediate. PROCEDURE: Informed consent was obtained for a tunneled central venous catheter exchange with fluoroscopy. Patient was placed supine on the interventional table. The left chest and catheter were prepped and draped in sterile fashion. Maximal barrier sterile technique was utilized including caps, mask, sterile gowns, sterile gloves, sterile drape, hand hygiene and skin antiseptic. Skin around the catheter exit site was anesthetized with 1% lidocaine.  The cuff was exposed using blunt and sharp dissection. The old catheter tip was in the right atrium. The old catheter was easily removed over a stiff Glidewire. A new single-lumen Powerline catheter was cut to 31 cm. Catheter was easily advanced over the wire and tip positioned in the right atrium. The cuff was placed underneath the skin. The catheter was secured to the skin near the entrance site using Prolene suture. Catheter aspirated and flushed well. Heparinized saline was placed in the catheter and a dressing was placed. Fluoroscopic images were taken and saved for this procedure. FINDINGS: Left jugular central line with vascular stents in the left innominate vein and SVC. Catheter tip in the right atrium. IMPRESSION: Successful exchange of the tunneled central venous catheter with fluoroscopy. Electronically Signed   By: Markus Daft M.D.   On: 01/14/2022 12:59  MR ANGIO HEAD WO CONTRAST  Result Date: 01/12/2022 CLINICAL DATA:  Follow-up examination for stroke, TIA common determine embolic source. EXAM: MRA NECK WITHOUT AND WITH CONTRAST MRA HEAD WITHOUT AND WITH CONTRAST TECHNIQUE: Multiplanar and multiecho pulse sequences of the neck were obtained without and with intravenous contrast. Angiographic images of the neck were obtained using MRA technique without and with intravenous contast.; Angiographic images of the Circle of Willis were obtained using MRA technique without and with intravenous contrast. CONTRAST:  Please see contrast documentation. COMPARISON:  Comparison made with previous brain MRI from 12/20/2021. FINDINGS: MRA NECK FINDINGS AORTIC ARCH: Visualized aortic arch normal in caliber with standard 3 vessel morphology. No hemodynamically significant stenosis seen about the origin of the great vessels. RIGHT CAROTID SYSTEM: Right CCA widely patent without stenosis. Atheromatous change about the right carotid bulb/proximal right ICA with associated stenosis of up to 50% by NASCET criteria  (series 103, image 19). Right ICA patent distally without additional stenosis or evidence for dissection. LEFT CAROTID SYSTEM: Left CCA patent from its origin to the bifurcation without stenosis. Atheromatous irregularity about the left carotid bulb/proximal left ICA with associated mild stenoses of up to 30% by NASCET criteria (series 104, image 19). Left ICA patent distally without additional stenosis or evidence for dissection. VERTEBRAL ARTERIES: Both vertebral arteries arise from the subclavian arteries. Focal approximate 40% stenosis noted at the origin of the right subclavian artery, proximal to the takeoff of the right vertebral artery (series 102, image 6). Proximal left subclavian artery widely patent. Focal mild stenosis noted at the origin of the left vertebral artery (series 102, image 11). Otherwise, vertebral arteries are widely patent without stenosis or evidence for dissection. Left vertebral artery slightly dominant. Other: None. MRA HEAD FINDINGS ANTERIOR CIRCULATION: Visualized distal cervical segments of the internal carotid arteries are mildly tortuous but widely patent with antegrade flow. Petrous segments widely patent bilaterally. Mild atheromatous irregularity within the carotid siphons without hemodynamically significant stenosis. Origins of the ophthalmic arteries normal. A1 segments patent bilaterally. Normal anterior communicating artery complex. Anterior cerebral arteries patent without stenosis. No significant M1 stenosis. 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 Right MCA bifurcation normal. Distal MCA branches well perfused and symmetric. POSTERIOR CIRCULATION: Both vertebral arteries patent without stenosis. Both PICA patent at their origins. Basilar widely patent to its distal aspect without stenosis. Subtle 2 mm outpouching extending from the left aspect of the mid basilar artery suspicious for an  additional small aneurysm (series 2, image 83). Superior cerebellar arteries patent bilaterally. Both PCAs primarily supplied via the basilar. PCAs are widely patent and well perfused to their distal aspects. Anatomic variants: None significant. IMPRESSION: MRA NECK IMPRESSION: 1. 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 2. Atheromatous irregularity about the left carotid bulb/proximal left ICA with associated stenoses of up to 30% by NASCET criteria. 3. 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 4. Mild stenosis at the origin of the left vertebral artery. Left vertebral artery slightly dominant. MRA HEAD IMPRESSION: 1. Wide patency of the intracranial arterial circulation. No large vessel occlusion. 2. Mild for age atheromatous irregularity about the carotid siphons. No hemodynamically significant or correctable stenosis. 3. 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 4. 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 Electronically Signed   By: Pincus Badder.D.  On: 01/12/2022 07:12   MR ANGIO NECK W WO CONTRAST  Result Date: 01/12/2022 CLINICAL DATA:  Follow-up examination for stroke, TIA common determine embolic source. EXAM: MRA NECK WITHOUT AND WITH CONTRAST MRA HEAD WITHOUT AND WITH CONTRAST TECHNIQUE: Multiplanar and multiecho pulse sequences of the neck were obtained without and with intravenous contrast. Angiographic images of the neck were obtained using MRA technique without and with intravenous contast.; Angiographic images of the Circle of Willis were obtained using MRA technique without and with intravenous contrast. CONTRAST:  Please see contrast documentation. COMPARISON:  Comparison made with previous brain MRI from 12/20/2021. FINDINGS: MRA NECK FINDINGS AORTIC ARCH: Visualized aortic arch normal in caliber with standard 3 vessel morphology. No  hemodynamically significant stenosis seen about the origin of the great vessels. RIGHT CAROTID SYSTEM: Right CCA widely patent without stenosis. Atheromatous change about the right carotid bulb/proximal right ICA with associated stenosis of up to 50% by NASCET criteria (series 103, image 19). Right ICA patent distally without additional stenosis or evidence for dissection. LEFT CAROTID SYSTEM: Left CCA patent from its origin to the bifurcation without stenosis. Atheromatous irregularity about the left carotid bulb/proximal left ICA with associated mild stenoses of up to 30% by NASCET criteria (series 104, image 19). Left ICA patent distally without additional stenosis or evidence for dissection. VERTEBRAL ARTERIES: Both vertebral arteries arise from the subclavian arteries. Focal approximate 40% stenosis noted at the origin of the right subclavian artery, proximal to the takeoff of the right vertebral artery (series 102, image 6). Proximal left subclavian artery widely patent. Focal mild stenosis noted at the origin of the left vertebral artery (series 102, image 11). Otherwise, vertebral arteries are widely patent without stenosis or evidence for dissection. Left vertebral artery slightly dominant. Other: None. MRA HEAD FINDINGS ANTERIOR CIRCULATION: Visualized distal cervical segments of the internal carotid arteries are mildly tortuous but widely patent with antegrade flow. Petrous segments widely patent bilaterally. Mild atheromatous irregularity within the carotid siphons without hemodynamically significant stenosis. Origins of the ophthalmic arteries normal. A1 segments patent bilaterally. Normal anterior communicating artery complex. Anterior cerebral arteries patent without stenosis. No significant M1 stenosis. 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 Right MCA bifurcation normal. Distal MCA branches well perfused and  symmetric. POSTERIOR CIRCULATION: Both vertebral arteries patent without stenosis. Both PICA patent at their origins. Basilar widely patent to its distal aspect without stenosis. Subtle 2 mm outpouching extending from the left aspect of the mid basilar artery suspicious for an additional small aneurysm (series 2, image 83). Superior cerebellar arteries patent bilaterally. Both PCAs primarily supplied via the basilar. PCAs are widely patent and well perfused to their distal aspects. Anatomic variants: None significant. IMPRESSION: MRA NECK IMPRESSION: 1. 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 2. Atheromatous irregularity about the left carotid bulb/proximal left ICA with associated stenoses of up to 30% by NASCET criteria. 3. 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 4. Mild stenosis at the origin of the left vertebral artery. Left vertebral artery slightly dominant. MRA HEAD IMPRESSION: 1. Wide patency of the intracranial arterial circulation. No large vessel occlusion. 2. Mild for age atheromatous irregularity about the carotid siphons. No hemodynamically significant or correctable stenosis. 3. 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 4. 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 Electronically Signed  By: Jeannine Boga M.D.   On: 01/12/2022 07:12   CARDIAC CATHETERIZATION  Result Date: 01/01/2022 Findings: RA = 9 RV = 58/10 PA = 56/23 (37) R PCW = 27 (v = 39) L PCW = 25 (v = 31) Fick cardiac output/index = 4.8/2.7 Thermo CO/CI = 4.7/2.7 PVR = 2.10 WU Ao sat = 98% PA sat = 62%, 61% Assessment: 1. Elevated left-sided filling pressures with mild to moderate pulmonary venous HTN and normal output 2. Prominent v-waves in PCWP tracing suggestive of significant MR Plan/Discussion: Increase diuretics. F/u with Structural Heart team re: mTEER. Will need TEE as well. Glori Bickers, MD 12:55 PM  MR BRAIN W WO CONTRAST  Result Date: 12/23/2021 CLINICAL DATA:  71 year old female with right side hearing loss and dizziness. 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 EXAM: MRI HEAD WITHOUT AND WITH CONTRAST TECHNIQUE: Multiplanar, multiecho pulse sequences of the brain and surrounding structures were obtained without and with intravenous contrast. CONTRAST:  68m MULTIHANCE GADOBENATE DIMEGLUMINE 529 MG/ML IV SOLN COMPARISON:  Brain MRI 07/24/2020. FINDINGS: Brain: Suboptimal DWI. No restricted diffusion to suggest acute infarction. No midline shift, mass effect, evidence of mass lesion, ventriculomegaly, extra-axial collection or acute intracranial hemorrhage. Cervicomedullary junction and pituitary are within normal limits. But new developing encephalomalacia in the bilateral occipital poles, PCA territories since the MRI last year (series 11, image 12). Some associated hemosiderin greater on the left. Chronic lacunar infarct of the left caudate is also new since last year. Other patchy and scattered bilateral cerebral white matter T2 and FLAIR hyperintensity not significantly changed. Chronic microhemorrhage in the posterior left temporal lobe is stable. Gyriform post ischemic enhancement in the occipital poles greater on the left. Brainstem and cerebellum appear to remain normal for age. No other abnormal intracranial enhancement identified. Vascular: Major intracranial vascular flow voids are stable since last year. Skull and upper cervical spine: Negative. Sinuses/Orbits: Postoperative changes to both globes since last year. Paranasal sinuses remain well aerated. Other: Dedicated internal auditory imaging. Normal cerebellopontine angles. Normal bilateral cisternal and intracanalicular 7th and 8th cranial nerve segments. No abnormal enhancement identified. Mastoids are clear. Normal stylomastoid foramina. No definite abnormality of the cochlea or vestibular structures.  IMPRESSION: 1. Negative internal auditory imaging. 2. Multifocal cerebral ischemia with extension since the March brain MRI last year. This includes late subacute to early chronic appearing bilateral PCA infarcts with developing encephalomalacia now in both occipital poles. 3. No superimposed acute intracranial abnormality. Electronically Signed   By: HGenevie AnnM.D.   On: 12/23/2021 08:22      Assessment/Plan Cerebrovascular accident (CVA), unspecified mechanism ([place]  Franki C Reisner presents with clinical and radiographic syndrome localizing to bilateral occipital lobes.  Etiology is either multifocal infarcts or PRES.  Imaging characteristics may be more c/w PRES, possibly related to one of her acute systemic syndromes or hospitalizations over the past year.  Although she has prior stroke well documented, the pattern, vascular territory, clinical syndrome, and presumed mechanism are all different here.    We reviewed her TEE and MRA studies, neither of which pointed at source of infarction in this case.  No posterior circulation stenosis.  We recommended she continue on anti-coagulation with Xarelto.  Could consider repeating MRI if heightened concern for PRES.   Case will be reviewed in detail in brain/spine tumor board next week.  We will call her with any new or updated recommendations from the group.  We spent twenty additional minutes teaching regarding the natural history, biology,  and historical experience in the treatment of neurologic complications of cancer.   We appreciate the opportunity to participate in the care of Jordana C Wichmann.  She may return to clinic as needed or as instructed following tumor board discussion.  All questions were answered. The patient knows to call the clinic with any problems, questions or concerns. No barriers to learning were detected.  The total time spent in the encounter was 40 minutes and more than 50% was on counseling and review of test  results   Ventura Sellers, MD Medical Director of Neuro-Oncology [facility] [place] 01/16/22 4:33 PM

## 2022-01-16 NOTE — Progress Notes (Addendum)
I called patient due to positive blood cultures growing GNR, not identified yet on 9/6. Patient refuses to go to the hospital stating that my line is already exchanged and she just needs antibiotics. Since GNR has not been identified yet and patient not in a supervised setting to monitor for fevers/sepsis I will change IV cefazolin to IV cefepime pending ID of GNR and update OPAT orders. I have advised her to go to the hospital in case she continues to have persistent fevers, chills and getting worse. She is agreeable. She just had line exchanged done by IR on 9/5 done.  She has a fu with Dr Linus Salmons next week.   Diagnosis: GNR bacteremia   Culture Result: GNR, No ID yet  Allergies  Allergen Reactions   Ivp Dye [Iodinated Contrast Media] Itching    Can use if taking benadryl     OPAT Orders Discharge antibiotics to be given via Powerline Discharge antibiotics: IV cefepime 2g iv q12 hrs Per pharmacy protocol  Duration: 7 days from start date for now , may need to extend to 14 days given recent line exchange before blood cultures were positive ( to be decided in next appt.)  [facility] Care Per Protocol:  Home health RN for IV administration and teaching; PICC line care and labs.    Labs weekly while on IV antibiotics: X__ CBC with differential X__ BMP __ CMP __ CRP __ ESR __ Vancomycin trough __ CK  Fax weekly labs to [phone]  Clinic Follow Up Appt: Next week  Rosiland Oz, MD Infectious Disease Physician [facility] [address] [place], [place] [address] Phone: [phone]  Fax: [phone]

## 2022-01-16 NOTE — Telephone Encounter (Signed)
Patient has positive blood culture with gram-negative rods.  ID is working on home antibiotics.  Initially there was a recommendation to go to the hospital but they are going to coordinate home antibiotics for her.  I appreciate ID help.  Explained to the patient that they are the right doctors to direct antibiotic treatment for her line sepsis.

## 2022-01-16 NOTE — Telephone Encounter (Signed)
Per Dr. West Bali, blood cultures have returned positive and patient needs to present to the emergency department for further workup.   Called patient and advised her to go to the ED, she is confused as to why this is necessary. Explained that this requires inpatient management and that ID service will follow up with her in the hospital.   Beryle Flock, RN

## 2022-01-16 NOTE — Telephone Encounter (Signed)
Thanks for coordinating, Jinny Blossom.

## 2022-01-16 NOTE — Telephone Encounter (Signed)
Sure. I will call her.

## 2022-01-16 NOTE — Telephone Encounter (Signed)
Pt stated that she seen Dr. West Bali ID Dr and she diagnosed her with a line infection and prescribed pt IV antibiotics. Pt stated that she received on shot of an antibiotics and the rest are to be given IV through her PICC line. Pt stated that she has been connected with [facility] who is to Contact Elana with [facility] to coordinate the Infusion of antibiotics: Pt stated that she is to see Dr. Linus Salmons next week on the 15th for a follow up appointment:  Pt was notified that I will document the  conversation and it will be in pt chart: Pt verbalized understanding with all questions answered.

## 2022-01-16 NOTE — Telephone Encounter (Signed)
Thanks for the update Meg!

## 2022-01-16 NOTE — Telephone Encounter (Signed)
Spoke with Carolynn Sayers, RN, patient is receiving TPN through [facility] and IV antibiotics will need to be managed through them.   Pam is aware that OPAT orders have changed to cefepime and she will get those orders over to [facility]. They are able to overnight the medication so patient can hopefully start tomorrow.   Carson Myrtle: [phone] F: [phone]  Pam has coordinated OPAT orders with pharmacist Marya Amsler at [facility].   No additional action needed on [facility] end.   Beryle Flock, RN

## 2022-01-17 NOTE — Anesthesia Postprocedure Evaluation (Signed)
Anesthesia Post Note  Patient: [name] [name]  Procedure(s) Performed: TRANSESOPHAGEAL ECHOCARDIOGRAM (TEE) BUBBLE STUDY     Patient location during evaluation: Endoscopy Anesthesia Type: MAC Level of consciousness: awake and alert Pain management: pain level controlled Vital Signs Assessment: post-procedure vital signs reviewed and stable Respiratory status: spontaneous breathing, nonlabored ventilation, respiratory function stable and patient connected to nasal cannula oxygen Cardiovascular status: stable and blood pressure returned to baseline Postop Assessment: no apparent nausea or vomiting Anesthetic complications: no   No notable events documented.  Last Vitals:  Vitals:   01/14/22 1030 01/14/22 1040  BP: (!) 106/55 (!) 115/57  Pulse: (!) 49 (!) 53  Resp: 15 (!) 24  Temp:    SpO2: 96% 98%    Last Pain:  Vitals:   01/15/22 1624  TempSrc:   PainSc: 0-No pain                 Duey Liller S

## 2022-01-19 LAB — CBC
HCT: 35.2 % (ref 35.0–45.0)
Hemoglobin: 11.2 g/dL — ABNORMAL LOW (ref 11.7–15.5)
MCH: 29.5 pg (ref 27.0–33.0)
MCHC: 31.8 g/dL — ABNORMAL LOW (ref 32.0–36.0)
MCV: 92.6 fL (ref 80.0–100.0)
MPV: 12.3 fL (ref 7.5–12.5)
Platelets: 119 10*3/uL — ABNORMAL LOW (ref 140–400)
RBC: 3.8 10*6/uL (ref 3.80–5.10)
RDW: 14.5 % (ref 11.0–15.0)
WBC: 8.3 10*3/uL (ref 3.8–10.8)

## 2022-01-19 LAB — CULTURE, BLOOD (SINGLE)
MICRO NUMBER:: 13879928
MICRO NUMBER:: 13879929
SPECIMEN QUALITY:: ADEQUATE
SPECIMEN QUALITY:: ADEQUATE

## 2022-01-20 ENCOUNTER — Other Ambulatory Visit: Payer: Self-pay

## 2022-01-20 ENCOUNTER — Telehealth: Payer: Self-pay | Admitting: Internal Medicine

## 2022-01-20 ENCOUNTER — Inpatient Hospital Stay: Payer: Medicare Other

## 2022-01-20 DIAGNOSIS — D638 Anemia in other chronic diseases classified elsewhere: Secondary | ICD-10-CM

## 2022-01-20 DIAGNOSIS — K912 Postsurgical malabsorption, not elsewhere classified: Secondary | ICD-10-CM

## 2022-01-20 DIAGNOSIS — K760 Fatty (change of) liver, not elsewhere classified: Secondary | ICD-10-CM

## 2022-01-20 DIAGNOSIS — Z789 Other specified health status: Secondary | ICD-10-CM

## 2022-01-20 LAB — ECHO TEE
AR max vel: 1.51 cm2
AV Area VTI: 1.41 cm2
AV Area mean vel: 1.35 cm2
AV Mean grad: 10 mmHg
AV Peak grad: 20.8 mmHg
AV Vena cont: 0.5 cm
Ao pk vel: 2.28 m/s
MV M vel: 4.51 m/s
MV Peak grad: 81.4 mmHg
MV VTI: 1.86 cm2
P 1/2 time: 570 msec
Radius: 0.5 cm

## 2022-01-20 NOTE — Telephone Encounter (Signed)
Spoke with Jiles Garter RN  from [place]: Jiles Garter is requesting that pt have CMP, mag, and phos redrawn: Jiles Garter stated that pt was recently off the TPN for about a week due to a recent line infection, line replaced and IV abx given: Jiles Garter stated that Cammy was just recently started back on TPN on  Friday 01/17/2022:  Jiles Garter RN also questioned since this is pt 2nd line infection this year could this pt benefit from an Antimicrobial lock instead of a simple Heparin lock: Jiles Garter state that pt has a ID appointment this week to see Dr. Linus Salmons this week  and was wondering if our office would recommend that to Dr. Linus Salmons:  Please advise

## 2022-01-20 NOTE — Telephone Encounter (Signed)
Pt made aware of Jiles Garter RN and Dr. Carlean Purl recommendations: Orders for labs placed in Epic; Pt made aware: Pt verbalized understanding with all questions answered.

## 2022-01-20 NOTE — Telephone Encounter (Signed)
OK to redraw labs  I passed ?/recommendation on to ID and IR

## 2022-01-20 NOTE — Telephone Encounter (Signed)
Inbound call from [place] with [name] stating that they recommend patient to recheck  lab work this week  and give her a call back. Please advise.

## 2022-01-21 ENCOUNTER — Other Ambulatory Visit (INDEPENDENT_AMBULATORY_CARE_PROVIDER_SITE_OTHER): Payer: Medicare Other

## 2022-01-21 ENCOUNTER — Encounter (HOSPITAL_COMMUNITY): Payer: Self-pay

## 2022-01-21 ENCOUNTER — Emergency Department (HOSPITAL_BASED_OUTPATIENT_CLINIC_OR_DEPARTMENT_OTHER): Payer: Medicare Other

## 2022-01-21 ENCOUNTER — Inpatient Hospital Stay (HOSPITAL_BASED_OUTPATIENT_CLINIC_OR_DEPARTMENT_OTHER)
Admission: EM | Admit: 2022-01-21 | Discharge: 2022-01-23 | DRG: 092 | Disposition: A | Discharge status: Home Health | Payer: Medicare Other | Attending: Family Medicine | Admitting: Family Medicine

## 2022-01-21 ENCOUNTER — Other Ambulatory Visit: Payer: Self-pay

## 2022-01-21 ENCOUNTER — Encounter: Payer: Self-pay | Admitting: Internal Medicine

## 2022-01-21 ENCOUNTER — Encounter (HOSPITAL_BASED_OUTPATIENT_CLINIC_OR_DEPARTMENT_OTHER): Payer: Self-pay

## 2022-01-21 DIAGNOSIS — R4182 Altered mental status, unspecified: Secondary | ICD-10-CM | POA: Diagnosis not present

## 2022-01-21 DIAGNOSIS — Z91041 Radiographic dye allergy status: Secondary | ICD-10-CM

## 2022-01-21 DIAGNOSIS — N1831 Chronic kidney disease, stage 3a: Secondary | ICD-10-CM | POA: Diagnosis present

## 2022-01-21 DIAGNOSIS — D696 Thrombocytopenia, unspecified: Secondary | ICD-10-CM | POA: Diagnosis present

## 2022-01-21 DIAGNOSIS — Z8616 Personal history of COVID-19: Secondary | ICD-10-CM | POA: Diagnosis not present

## 2022-01-21 DIAGNOSIS — D638 Anemia in other chronic diseases classified elsewhere: Secondary | ICD-10-CM | POA: Diagnosis not present

## 2022-01-21 DIAGNOSIS — G9389 Other specified disorders of brain: Secondary | ICD-10-CM | POA: Diagnosis present

## 2022-01-21 DIAGNOSIS — I080 Rheumatic disorders of both mitral and aortic valves: Secondary | ICD-10-CM | POA: Diagnosis present

## 2022-01-21 DIAGNOSIS — K6389 Other specified diseases of intestine: Secondary | ICD-10-CM | POA: Diagnosis not present

## 2022-01-21 DIAGNOSIS — Z86718 Personal history of other venous thrombosis and embolism: Secondary | ICD-10-CM | POA: Diagnosis not present

## 2022-01-21 DIAGNOSIS — K90829 Short bowel syndrome, unspecified: Secondary | ICD-10-CM

## 2022-01-21 DIAGNOSIS — I5022 Chronic systolic (congestive) heart failure: Secondary | ICD-10-CM | POA: Diagnosis present

## 2022-01-21 DIAGNOSIS — Z7984 Long term (current) use of oral hypoglycemic drugs: Secondary | ICD-10-CM

## 2022-01-21 DIAGNOSIS — I517 Cardiomegaly: Secondary | ICD-10-CM | POA: Diagnosis not present

## 2022-01-21 DIAGNOSIS — T361X5A Adverse effect of cephalosporins and other beta-lactam antibiotics, initial encounter: Secondary | ICD-10-CM | POA: Diagnosis present

## 2022-01-21 DIAGNOSIS — G9341 Metabolic encephalopathy: Secondary | ICD-10-CM | POA: Diagnosis not present

## 2022-01-21 DIAGNOSIS — Z9049 Acquired absence of other specified parts of digestive tract: Secondary | ICD-10-CM

## 2022-01-21 DIAGNOSIS — Z881 Allergy status to other antibiotic agents status: Secondary | ICD-10-CM

## 2022-01-21 DIAGNOSIS — Z789 Other specified health status: Secondary | ICD-10-CM | POA: Diagnosis not present

## 2022-01-21 DIAGNOSIS — J9811 Atelectasis: Secondary | ICD-10-CM | POA: Diagnosis not present

## 2022-01-21 DIAGNOSIS — K912 Postsurgical malabsorption, not elsewhere classified: Secondary | ICD-10-CM

## 2022-01-21 DIAGNOSIS — Z20822 Contact with and (suspected) exposure to covid-19: Secondary | ICD-10-CM | POA: Diagnosis not present

## 2022-01-21 DIAGNOSIS — D6859 Other primary thrombophilia: Secondary | ICD-10-CM | POA: Diagnosis not present

## 2022-01-21 DIAGNOSIS — Z79899 Other long term (current) drug therapy: Secondary | ICD-10-CM

## 2022-01-21 DIAGNOSIS — Z452 Encounter for adjustment and management of vascular access device: Secondary | ICD-10-CM | POA: Diagnosis not present

## 2022-01-21 DIAGNOSIS — G928 Other toxic encephalopathy: Principal | ICD-10-CM | POA: Diagnosis present

## 2022-01-21 DIAGNOSIS — K219 Gastro-esophageal reflux disease without esophagitis: Secondary | ICD-10-CM | POA: Diagnosis not present

## 2022-01-21 DIAGNOSIS — R001 Bradycardia, unspecified: Secondary | ICD-10-CM | POA: Diagnosis not present

## 2022-01-21 DIAGNOSIS — N281 Cyst of kidney, acquired: Secondary | ICD-10-CM | POA: Diagnosis not present

## 2022-01-21 DIAGNOSIS — K760 Fatty (change of) liver, not elsewhere classified: Secondary | ICD-10-CM | POA: Diagnosis not present

## 2022-01-21 DIAGNOSIS — K5939 Other megacolon: Secondary | ICD-10-CM | POA: Diagnosis not present

## 2022-01-21 DIAGNOSIS — R410 Disorientation, unspecified: Secondary | ICD-10-CM | POA: Diagnosis not present

## 2022-01-21 DIAGNOSIS — E8809 Other disorders of plasma-protein metabolism, not elsewhere classified: Secondary | ICD-10-CM | POA: Diagnosis present

## 2022-01-21 DIAGNOSIS — R7881 Bacteremia: Secondary | ICD-10-CM | POA: Diagnosis not present

## 2022-01-21 DIAGNOSIS — R161 Splenomegaly, not elsewhere classified: Secondary | ICD-10-CM | POA: Diagnosis present

## 2022-01-21 DIAGNOSIS — Z8673 Personal history of transient ischemic attack (TIA), and cerebral infarction without residual deficits: Secondary | ICD-10-CM | POA: Diagnosis not present

## 2022-01-21 DIAGNOSIS — Z7901 Long term (current) use of anticoagulants: Secondary | ICD-10-CM

## 2022-01-21 DIAGNOSIS — I5042 Chronic combined systolic (congestive) and diastolic (congestive) heart failure: Secondary | ICD-10-CM | POA: Diagnosis present

## 2022-01-21 DIAGNOSIS — R911 Solitary pulmonary nodule: Secondary | ICD-10-CM | POA: Diagnosis present

## 2022-01-21 DIAGNOSIS — Z6822 Body mass index (BMI) 22.0-22.9, adult: Secondary | ICD-10-CM

## 2022-01-21 DIAGNOSIS — M549 Dorsalgia, unspecified: Secondary | ICD-10-CM | POA: Diagnosis not present

## 2022-01-21 DIAGNOSIS — I272 Pulmonary hypertension, unspecified: Secondary | ICD-10-CM | POA: Diagnosis present

## 2022-01-21 DIAGNOSIS — M810 Age-related osteoporosis without current pathological fracture: Secondary | ICD-10-CM | POA: Diagnosis present

## 2022-01-21 DIAGNOSIS — R933 Abnormal findings on diagnostic imaging of other parts of digestive tract: Secondary | ICD-10-CM | POA: Diagnosis not present

## 2022-01-21 DIAGNOSIS — N2889 Other specified disorders of kidney and ureter: Secondary | ICD-10-CM | POA: Diagnosis not present

## 2022-01-21 DIAGNOSIS — E46 Unspecified protein-calorie malnutrition: Secondary | ICD-10-CM | POA: Diagnosis present

## 2022-01-21 DIAGNOSIS — Z8249 Family history of ischemic heart disease and other diseases of the circulatory system: Secondary | ICD-10-CM

## 2022-01-21 DIAGNOSIS — D631 Anemia in chronic kidney disease: Secondary | ICD-10-CM | POA: Diagnosis present

## 2022-01-21 LAB — COMPREHENSIVE METABOLIC PANEL
ALT: 16 U/L (ref 0–35)
ALT: 19 U/L (ref 0–44)
AST: 24 U/L (ref 0–37)
AST: 32 U/L (ref 15–41)
Albumin: 3.1 g/dL — ABNORMAL LOW (ref 3.5–5.2)
Albumin: 2.6 g/dL — ABNORMAL LOW (ref 3.5–5.0)
Alkaline Phosphatase: 145 U/L — ABNORMAL HIGH (ref 39–117)
Alkaline Phosphatase: 141 U/L — ABNORMAL HIGH (ref 38–126)
Anion gap: 9 (ref 5–15)
BUN: 27 mg/dL — ABNORMAL HIGH (ref 6–23)
BUN: 26 mg/dL — ABNORMAL HIGH (ref 8–23)
CO2: 25 mEq/L (ref 19–32)
CO2: 22 mmol/L (ref 22–32)
Calcium: 9.1 mg/dL (ref 8.4–10.5)
Calcium: 8.5 mg/dL — ABNORMAL LOW (ref 8.9–10.3)
Chloride: 102 mEq/L (ref 96–112)
Chloride: 103 mmol/L (ref 98–111)
Creatinine, Ser: 1.18 mg/dL (ref 0.40–1.20)
Creatinine, Ser: 1.2 mg/dL — ABNORMAL HIGH (ref 0.44–1.00)
GFR, Estimated: 48 mL/min — ABNORMAL LOW (ref >60)
GFR: 46.4 mL/min — ABNORMAL LOW (ref >60.00)
Glucose, Bld: 76 mg/dL (ref 70–99)
Glucose, Bld: 76 mg/dL (ref 70–99)
Potassium: 4.5 mEq/L (ref 3.5–5.1)
Potassium: 4.2 mmol/L (ref 3.5–5.1)
Sodium: 134 mEq/L — ABNORMAL LOW (ref 135–145)
Sodium: 134 mmol/L — ABNORMAL LOW (ref 135–145)
Total Bilirubin: 0.4 mg/dL (ref 0.2–1.2)
Total Bilirubin: 0.6 mg/dL (ref 0.3–1.2)
Total Protein: 8.5 g/dL — ABNORMAL HIGH (ref 6.0–8.3)
Total Protein: 8.1 g/dL (ref 6.5–8.1)

## 2022-01-21 LAB — RESP PANEL BY RT-PCR (FLU A&B, COVID) ARPGX2
Influenza A by PCR: NEGATIVE
Influenza B by PCR: NEGATIVE
SARS Coronavirus 2 by RT PCR: NEGATIVE

## 2022-01-21 LAB — URINALYSIS, MICROSCOPIC (REFLEX): WBC, UA: NONE SEEN WBC/hpf (ref 0–5)

## 2022-01-21 LAB — LACTIC ACID, PLASMA
Lactic Acid, Venous: 1.9 mmol/L (ref 0.5–1.9)
Lactic Acid, Venous: 1.4 mmol/L (ref 0.5–1.9)

## 2022-01-21 LAB — PHOSPHORUS: Phosphorus: 1.9 mg/dL — ABNORMAL LOW (ref 2.3–4.6)

## 2022-01-21 LAB — URINALYSIS, ROUTINE W REFLEX MICROSCOPIC
Bilirubin Urine: NEGATIVE
Glucose, UA: NEGATIVE mg/dL
Ketones, ur: NEGATIVE mg/dL
Leukocytes,Ua: NEGATIVE
Nitrite: NEGATIVE
Protein, ur: 30 mg/dL — AB
Specific Gravity, Urine: 1.01 (ref 1.005–1.030)
pH: 6 (ref 5.0–8.0)

## 2022-01-21 LAB — AMMONIA: Ammonia: 15 umol/L (ref 9–35)

## 2022-01-21 LAB — CBC
HCT: 34.7 % — ABNORMAL LOW (ref 36.0–46.0)
Hemoglobin: 11.2 g/dL — ABNORMAL LOW (ref 12.0–15.0)
MCH: 29.8 pg (ref 26.0–34.0)
MCHC: 32.3 g/dL (ref 30.0–36.0)
MCV: 92.3 fL (ref 80.0–100.0)
Platelets: 93 10*3/uL — ABNORMAL LOW (ref 150–400)
RBC: 3.76 MIL/uL — ABNORMAL LOW (ref 3.87–5.11)
RDW: 16.5 % — ABNORMAL HIGH (ref 11.5–15.5)
WBC: 5.6 10*3/uL (ref 4.0–10.5)
nRBC: 0 % (ref 0.0–0.2)

## 2022-01-21 LAB — PROTIME-INR
INR: 1.5 — ABNORMAL HIGH (ref 0.8–1.2)
Prothrombin Time: 18.2 seconds — ABNORMAL HIGH (ref 11.4–15.2)

## 2022-01-21 LAB — TROPONIN I (HIGH SENSITIVITY)
Troponin I (High Sensitivity): 33 ng/L — ABNORMAL HIGH (ref <18)
Troponin I (High Sensitivity): 32 ng/L — ABNORMAL HIGH (ref <18)

## 2022-01-21 LAB — MAGNESIUM: Magnesium: 2.1 mg/dL (ref 1.5–2.5)

## 2022-01-21 LAB — CBG MONITORING, ED: Glucose-Capillary: 74 mg/dL (ref 70–99)

## 2022-01-21 LAB — APTT: aPTT: 34 seconds (ref 24–36)

## 2022-01-21 MED ORDER — ACETAMINOPHEN 325 MG PO TABS
650.0000 mg | ORAL_TABLET | Freq: Four times a day (QID) | ORAL | Status: DC | PRN
  Filled 2022-01-21: qty 2

## 2022-01-21 MED ORDER — ACETAMINOPHEN 650 MG RE SUPP: 650.0000 mg | Freq: Four times a day (QID) | RECTAL | Status: DC | PRN

## 2022-01-21 MED ORDER — IOHEXOL 350 MG/ML SOLN
100.0000 mL | Freq: Once | INTRAVENOUS | Status: AC | PRN
  Administered 2022-01-21: 100 mL via INTRAVENOUS

## 2022-01-21 MED ORDER — DIPHENHYDRAMINE HCL 50 MG/ML IJ SOLN
12.5000 mg | Freq: Once | INTRAMUSCULAR | Status: AC
  Administered 2022-01-21: 12.5 mg via INTRAVENOUS
  Filled 2022-01-21: qty 1

## 2022-01-21 NOTE — Progress Notes (Signed)
Pt has arrived to [facility]. Admission notified of pt arrival. Pt confused, answers to all questions with "yes", family at the bedside, and they identified pt appropriately.  Pt able to fallow commands, VS stable, no signs of acute distress. Cardiac monitor placed and CCMD notified. Call bell left within pt reach, and bed alarm in place along with floor mats. Will continue to monitor pt and treat per MD orders.

## 2022-01-21 NOTE — Telephone Encounter (Signed)
Detailed message left on [place] RN voice mail notifying her that pt is currently in the ER.

## 2022-01-21 NOTE — ED Provider Notes (Cosign Needed Addendum)
[facility]ER[facility]ENC[facility] DEPARTMENT Provider Note   CSN: [id number] Arrival date & time: 01/21/22  1252     History  Chief Complaint  Patient presents with   Altered Mental Status   Aphasia    [name] [name] is a 71 y.o. female.  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  Husband and daughter at bedside.  Patient was noted to have change in mental status around 9 PM last night.  Patient has been taking IV cefepime through her PICC line.  She was due to have a follow-up with infectious disease tomorrow for repeat cultures.  Patient has not been able to follow directions, or respond appropriately all morning.  Per husband patient was moaning throughout the night.  No recent complaints of URI symptoms, dysuria, or pain.  Per daughter it is typical for patient to become confused and disoriented with infections.  She states this episode is worse than typical.  The history is provided by the patient. No language interpreter was used.       Home Medications Prior to Admission medications   Medication Sig Start Date End Date Taking? Authorizing Provider  acetaminophen (TYLENOL) 500 MG tablet Take 1,000 mg by mouth every 6 (six) hours as needed for mild pain.    [provider]  ADULT TPN Inject 1,800 mLs into the vein 4 (four) times a week. Pt receives home TPN from [facility]:  1800 mL bag, four nights weekly (Monday, Tuesday, Wednesday, Thursday for 8 hours (includes 1 hour taper up and down).    [provider]  Calcium Carb-Cholecalciferol (CALCIUM + D3 PO) Take 1 tablet by mouth in the morning and at bedtime. Morning & early evening.    [provider]  cetirizine (ZYRTEC) 10 MG tablet Take 10 mg by mouth daily as needed for allergies.    [provider]  clobetasol  ointment (TEMOVATE) 7.84 % Apply 1 application  topically 2 (two) times daily as needed (irritation). 06/14/21   [provider]  diazepam (VALIUM) 2 MG tablet Take 1 tablet (2 mg total) by mouth every 6 (six) hours as needed for anxiety. Take one tablet one hour prior to scan for claustrophobia. Patient not taking: Reported on 01/15/2022 01/01/22   Gardenia Phlegm, NP  diphenhydrAMINE (BENADRYL) 25 mg capsule Take 25 mg by mouth every 6 (six) hours as needed for itching or sleep.    [provider]  empagliflozin (JARDIANCE) 10 MG TABS tablet Take 1 tablet (10 mg total) by mouth daily before breakfast. 06/11/21   Bensimhon, Shaune Pascal, MD  ergocalciferol (DRISDOL) 200 MCG/ML drops Take 6 mLs (48,000 Units total) by mouth 3 (three) times a week. 08/21/21   Gatha Mayer, MD  fluconazole (DIFLUCAN) 150 MG tablet Take 1 tablet today.  Take second tablet 3 days later. 01/04/22   Lynden Oxford Scales, PA-C  furosemide (LASIX) 40 MG tablet Take 2 tablets (80 mg total) by mouth daily. Patient taking differently: Take 40 mg by mouth 2 (two) times daily. Morning & early afternoon. 12/16/21   Bensimhon, Shaune Pascal, MD  Heparin Sodium, Porcine, (HEPARIN LOCK FLUSH IJ) Inject 5 mLs as directed 4 (four) times a week. Monday, Tuesday, Wednesday, Thursday in the morning with TPN    [provider]  losartan (COZAAR) 25 MG tablet Take 1 tablet (  25 mg total) by mouth at bedtime. Patient taking differently: Take 12.5 mg by mouth at bedtime. 12/16/21   Bensimhon, Shaune Pascal, MD  omeprazole (PRILOSEC) 40 MG capsule TAKE 1 CAPSULE BY MOUTH EVERY DAY BEFORE BREAKFAST 12/27/21   Gatha Mayer, MD  ondansetron (ZOFRAN-ODT) 8 MG disintegrating tablet TAKE 1 TABLET BY MOUTH EVERY 8 HOURS AS NEEDED FOR NAUSEA OR VOMITING. 09/09/21   Gatha Mayer, MD  Pediatric Multivit-Minerals (FLINTSTONES GUMMIES PO) Take 4 tablets by mouth in the morning.    [provider]  potassium chloride SA (KLOR-CON  M20) 20 MEQ tablet Take 1 tablet (20 mEq total) by mouth daily. 12/16/21   Bensimhon, Shaune Pascal, MD  sodium chloride 0.9 % infusion Inject 900 mLs into the vein every 14 (fourteen) days. Uses for TPN 10/27/17   [provider]  spironolactone (ALDACTONE) 25 MG tablet TAKE 1 TABLET (25 MG TOTAL) BY MOUTH DAILY. Patient taking differently: Take 25 mg by mouth in the morning. 08/28/21   Bensimhon, Shaune Pascal, MD  Teduglutide, rDNA, 5 MG KIT Inject 3.31 Units into the skin every morning. 10/17/21   Gatha Mayer, MD  triamcinolone cream (KENALOG) 0.1 % Apply 1 application topically as needed (itching).    [provider]  vitamin E 1000 UNIT capsule Take 1,000 Units by mouth in the morning.    [provider]  XARELTO 20 MG TABS tablet TAKE 1 TABLET BY MOUTH DAILY WITH SUPPER. 08/30/21   [name], MD      Allergies    Ivp dye [iodinated contrast media]    Review of Systems   Review of Systems  Unable to perform ROS: Mental status change  Constitutional:  Negative for chills and fever.  Respiratory:  Negative for shortness of breath.   Cardiovascular:  Negative for chest pain.  Gastrointestinal:  Negative for abdominal pain.  All other systems reviewed and are negative.   Physical Exam Updated Vital Signs BP 112/61   Pulse (!) 56   Temp 98.6 F (37 C)   Resp 15   Ht 5' 8" (1.727 m)   Wt 61.9 kg   LMP 02/26/2014   SpO2 100%   BMI 20.74 kg/m  Physical Exam Vitals and nursing note reviewed.  Constitutional:      General: She is not in acute distress.    Appearance: Normal appearance. She is not ill-appearing.  HENT:     Head: Normocephalic and atraumatic.     Nose: Nose normal.  Eyes:     General: No scleral icterus.    Extraocular Movements: Extraocular movements intact.     Conjunctiva/sclera: Conjunctivae normal.  Cardiovascular:     Rate and Rhythm: Normal rate and regular rhythm.     Pulses: Normal pulses.  Pulmonary:     Effort:  Pulmonary effort is normal. No respiratory distress.     Breath sounds: Normal breath sounds. No wheezing or rales.  Abdominal:     General: There is no distension.     Tenderness: There is abdominal tenderness. There is guarding.  Musculoskeletal:        General: Normal range of motion.     Cervical back: Normal range of motion.     Right lower leg: No edema.     Left lower leg: No edema.  Skin:    General: Skin is warm and dry.  Neurological:     General: No focal deficit present.     Mental Status: She is alert.  Mental status is at baseline.     Comments: Cranial nerves III through XII intact.  Patient is alert but not oriented.  She is oriented to self.  Tongue midline.  Without pronator drift.  Symmetrical strength in bilateral upper and lower extremities. Does follow some commands.  Without facial droop.     ED Results / Procedures / Treatments   Labs (all labs ordered are listed, but only abnormal results are displayed) Labs Reviewed  RESP PANEL BY RT-PCR (FLU A&B, COVID) ARPGX2  CULTURE, BLOOD (ROUTINE X 2)  CULTURE, BLOOD (ROUTINE X 2)  URINE CULTURE  COMPREHENSIVE METABOLIC PANEL  CBC  LACTIC ACID, PLASMA  LACTIC ACID, PLASMA  APTT  URINALYSIS, ROUTINE W REFLEX MICROSCOPIC  PROTIME-INR  CBG MONITORING, ED  CBG MONITORING, ED    EKG None  Radiology No results found.  Procedures Procedures    Medications Ordered in ED Medications - No data to display  ED Course/ Medical Decision Making/ A&P Clinical Course as of 01/21/22 1848  Tue Jan 21, 2022  1536 Discussed with Dr. Margaret Pyle of radiology who agrees that patient's contrast allergy of itching that is listed in the chart is not true allergic reaction and can proceed with CT angio without full contrast allergy protocol.  Will give dose of Benadryl and proceed. [AA]  1808 Patient's work-up today shows CBC without leukocytosis, hemoglobin of 11.2 which is around baseline.  CMP with creatinine 1.20 otherwise  without acute findings.  Mildly elevated alk phos but otherwise normal LFTs.  COVID and flu negative.  UA shows no significant evidence of UTI.  Urine culture pending.  Initial lactic acid normal.  Initial troponin of 33 which potentially may be downtrending from troponinemia from last month.  Last month troponin was about 186.  Currently without chest pain.  CT angio chest, abdomen, and pelvis dissection study without evidence of dissection.  There is an area of stricture/mass seen near the colon.  No evidence of bowel obstruction.  CT head without acute intracranial findings.  Chest x-ray without evidence of pneumonia.  Discussed with hospitalist for admission for appendectomy secondary to bacteremia. [AA]    Clinical Course User Index [AA] Evlyn Courier, PA-C                           Medical Decision Making Amount and/or Complexity of Data Reviewed Labs: ordered. Radiology: ordered. ECG/medicine tests: ordered.  Risk Prescription drug management. Decision regarding hospitalization.   Medical Decision Making / ED Course   This patient presents to the ED for concern of altered mental status, this involves an extensive number of treatment options, and is a complaint that carries with it a high risk of complications and morbidity.  The differential diagnosis includes CVA, encephalopathy secondary to bacteremia, acute intracranial bleed, bacterial infection such as dysuria, pneumonia  MDM: 71 year old female with past medical history significant for CVA, short gut syndrome on chronic TPN, recent powerline infection.  She has been on cefepime for the past week.  Her PICC line was replaced.  They did not want her power line accessed in the emergency room as this is her last powerline they would like to preserve it at all cost.  They report it has been functioning well over the past week with TPN, and IV antibiotics.  Work-up today as above.  CT head without signs of intracranial  bleed. Gastroenterology notified of patient, and CT findings.  Patient discussed with Dr. Flossie Buffy of  hospitalist who will accept patient for admission. Activities from recent blood culture showed sensitivity to cefepime.  Last dose of cefepime this morning at 10 AM.   Additional history obtained: -Additional history obtained from recent infectious disease note -External records from outside source obtained and reviewed including: Chart review including previous notes, labs, imaging, consultation notes   Lab Tests: -I ordered, reviewed, and interpreted labs.   The pertinent results include:   Labs Reviewed  URINALYSIS, ROUTINE W REFLEX MICROSCOPIC - Abnormal; Notable for the following components:      Result Value   Hgb urine dipstick TRACE (*)    Protein, ur 30 (*)    All other components within normal limits  PROTIME-INR - Abnormal; Notable for the following components:   Prothrombin Time 18.2 (*)    INR 1.5 (*)    All other components within normal limits  CBC - Abnormal; Notable for the following components:   RBC 3.76 (*)    Hemoglobin 11.2 (*)    HCT 34.7 (*)    RDW 16.5 (*)    Platelets 93 (*)    All other components within normal limits  COMPREHENSIVE METABOLIC PANEL - Abnormal; Notable for the following components:   Sodium 134 (*)    BUN 26 (*)    Creatinine, Ser 1.20 (*)    Calcium 8.5 (*)    Albumin 2.6 (*)    Alkaline Phosphatase 141 (*)    GFR, Estimated 48 (*)    All other components within normal limits  URINALYSIS, MICROSCOPIC (REFLEX) - Abnormal; Notable for the following components:   Bacteria, UA RARE (*)    All other components within normal limits  TROPONIN I (HIGH SENSITIVITY) - Abnormal; Notable for the following components:   Troponin I (High Sensitivity) 33 (*)    All other components within normal limits  TROPONIN I (HIGH SENSITIVITY) - Abnormal; Notable for the following components:   Troponin I (High Sensitivity) 32 (*)    All other components  within normal limits  RESP PANEL BY RT-PCR (FLU A&B, COVID) ARPGX2  CULTURE, BLOOD (ROUTINE X 2)  CULTURE, BLOOD (ROUTINE X 2)  URINE CULTURE  LACTIC ACID, PLASMA  LACTIC ACID, PLASMA  APTT  AMMONIA  CBG MONITORING, ED  CBG MONITORING, ED      EKG  EKG Interpretation  Date/Time:  Tuesday January 21 2022 13:11:39 EDT Ventricular Rate:  57 PR Interval:  240 QRS Duration: 168 QT Interval:  480 QTC Calculation: 467 R Axis:   -28 Text Interpretation: Sinus bradycardia with 1st degree A-V block Left bundle branch block Abnormal ECG When compared with ECG of 09-Dec-2021 14:59, PREVIOUS ECG IS PRESENT No Sgarbossa Confirmed by Georgina Snell [phone]) on 01/21/2022 3:20:59 PM         Imaging Studies ordered: I ordered imaging studies including chest x-ray, CT head, CT chest abdomen pelvis dissection study I independently visualized and interpreted imaging. I agree with the radiologist interpretation   Medicines ordered and prescription drug management: Meds ordered this encounter  Medications   diphenhydrAMINE (BENADRYL) injection 12.5 mg   iohexol (OMNIPAQUE) 350 MG/ML injection 100 mL    -I have reviewed the patients home medicines and have made adjustments as needed  Cardiac Monitoring: The patient was maintained on a cardiac monitor.  I personally viewed and interpreted the cardiac monitored which showed an underlying rhythm of: Normal sinus rhythm to sinus bradycardia.  Recent office visits did show rates of mid 53s.  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   Carotid stenosis    Carotid US (9/15):  R 40-59%; L 1-39% >> FU 1 year   Cataract    Closed left subtrochanteric femur fracture (HCC) 12/31/2017   Clotting disorder (HCC)    Congestive heart failure (CHF) (HCC)    EF 25-30%   COVID-19 2022   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   Fever 10/29/2020   Fracture of left clavicle    GERD (gastroesophageal reflux disease)    History of blood transfusion 2013   anemia   Hx of cardiovascular stress test    Myoview (9/15):  inf-apical scar; no ischemia; EF 47% - low risk    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 12/12/2011   Osteoporosis    Pancytopenia 10/07/2011   Pathologic fracture of neck of femur ([facility])    Personal history of colonic polyps    Renal insufficiency    hx of yrs ago   Serratia marcescens infection - bactermia assoc w/ PICC 01/18/2015   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 Flossie Buffy who will accept patient for admission.  Final Clinical Impression(s) / ED Diagnoses Final diagnoses:  Altered mental status, unspecified altered mental status type  Bacteremia    Rx / DC Orders ED Discharge Orders     None         Evlyn Courier, PA-C 01/21/22 1853    Evlyn Courier, PA-C 01/21/22 1903    Elgie Congo, MD 01/22/22 1924

## 2022-01-21 NOTE — H&P (Signed)
History and Physical    PLEASE NOTE THAT DRAGON DICTATION SOFTWARE WAS USED IN THE CONSTRUCTION OF THIS NOTE.   [name] [name] NGE:[id number] DOB: 03/10/1951 DOA: 01/21/2022  PCP: [name], MD *** Patient coming from: home ***  I have personally briefly reviewed patient's old medical records in [place]  Chief Complaint: ***  HPI: [name] [name] is a 71 y.o. female with medical history significant for *** who is admitted to [facility] on 01/21/2022 with *** after presenting from home*** to [facility] ED complaining of ***.    ***    ***SOB: Denies any associated orthopnea, PND, or new onset peripheral edema. No recent chest pain, diaphoresis, palpitations, N/V, pre-syncope, or syncope. Not associated with any recent cough, wheezing, hemoptysis, new lower extremity erythema, or calf tenderness. Denies any recent trauma, travel, surgical procedures, or periods of prolonged diminished ambulatory status. No recent melena or hematochezia.   Denies any associated subjective fever, chills, rigors, or generalized myalgias. No recent headache, neck stiffness, rhinitis, rhinorrhea, sore throat, abdominal pain, diarrhea, or rash. No known recent COVID-19 exposures. Denies dysuria, gross hematuria, or change in urinary urgency/frequency.  ***   ***misc/infectious: Denies any subjective fever, chills, rigors, or generalized myalgias. Denies any recent headache, neck stiffness, rhinitis, rhinorrhea, sore throat, sob, wheezing, cough, nausea, vomiting, abdominal pain, diarrhea, or rash. No recent traveling or known COVID-19 exposures. Denies dysuria, gross hematuria, or change in urinary urgency/frequency.  Denies any recent chest pain, diaphoresis, or palpitations. ***    ED Course:  Vital signs in the ED were notable for the following: ***  Labs were notable for the following: ***  Imaging and additional notable ED work-up: ***  While in the ED, the following were  administered: ***  Subsequently, the patient was admitted  ***  ***red    Review of Systems: As per HPI otherwise 10 point review of systems negative.   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   Carotid stenosis    Carotid US (9/15):  R 40-59%; L 1-39% >> FU 1 year   Cataract    Closed left subtrochanteric femur fracture ([place]) 12/31/2017   Clotting disorder ([place])    Congestive heart failure (CHF) (HCC)    EF 25-30%   COVID-19 2022   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 07/31/2017   Fever 10/29/2020   Fracture of left clavicle    GERD (gastroesophageal reflux disease)    History of blood transfusion 2013   anemia   Hx of cardiovascular stress test    Myoview (9/15):  inf-apical scar; no ischemia; EF 47% - low risk    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 12/12/2011   Osteoporosis    Pancytopenia 10/07/2011   Pathologic fracture of neck of femur ([place])    Personal history of colonic polyps    Renal insufficiency    hx of yrs ago   Serratia marcescens infection - bactermia assoc w/ PICC 01/18/2015   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HCC)    Splenomegaly    By ultrasound  Stroke ([place]) 07/2020   Thrombophilia ([place])    Vitamin D deficiency     Past Surgical History:  Procedure Laterality Date   APPENDECTOMY  yrs ago   BUBBLE STUDY  01/14/2022   Procedure: BUBBLE STUDY;  Surgeon: [name], MD;  Location: [facility] ENDOSCOPY;  Service: Cardiology;;   CHOLECYSTECTOMY  yrs ago   COLONOSCOPY  12/05/2005   internal hemorrhoids (for polyp surveillance)   COLONOSCOPY  04/26/2012   Procedure:  COLONOSCOPY;  Surgeon: Gatha Mayer, MD;  Location: [facility] ENDOSCOPY;  Service: Endoscopy;  Laterality: N/A;   COLONOSCOPY WITH PROPOFOL N/A 03/18/2016   Procedure: COLONOSCOPY WITH PROPOFOL;  Surgeon: Gatha Mayer, MD;  Location: [facility] ENDOSCOPY;  Service: Endoscopy;  Laterality: N/A;   ESOPHAGOGASTRODUODENOSCOPY  01/22/2009   erosive esophagitis   HARDWARE REMOVAL Left 12/31/2017   Procedure: HARDWARE REMOVAL;  Surgeon: Rod Can, MD;  Location: [facility] ORS;  Service: Orthopedics;  Laterality: Left;   INTRAMEDULLARY (IM) NAIL INTERTROCHANTERIC Left 12/31/2017   Procedure: INTRAMEDULLARY (IM) NAIL SUBTROCHANTRIC;  Surgeon: Rod Can, MD;  Location: [facility] ORS;  Service: Orthopedics;  Laterality: Left;   IR CV LINE INJECTION  03/23/2018   IR CV LINE INJECTION  10/02/2021   IR FLUORO GUIDE CV LINE LEFT  01/01/2018   IR FLUORO GUIDE CV LINE LEFT  03/24/2018   IR FLUORO GUIDE CV LINE LEFT  05/21/2018   IR FLUORO GUIDE CV LINE LEFT  07/09/2018   IR FLUORO GUIDE CV LINE LEFT  12/28/2020   IR FLUORO GUIDE CV LINE LEFT  07/02/2021   IR FLUORO GUIDE CV LINE LEFT  01/14/2022   IR FLUORO GUIDE CV LINE RIGHT  11/01/2020   IR GENERIC HISTORICAL  07/04/2016   IR REMOVAL TUN CV CATH W/O FL 07/04/2016 Ascencion Dike, PA-C [facility]-INTER[facility] RAD   IR GENERIC HISTORICAL  07/10/2016   IR US GUIDE VASC ACCESS LEFT 07/10/2016 Arne Cleveland, MD [facility]   IR GENERIC HISTORICAL  07/10/2016   IR FLUORO GUIDE CV LINE LEFT 07/10/2016 Arne Cleveland, MD [facility]   IR PATIENT EVAL TECH 0-60 MINS  10/07/2017   IR PATIENT EVAL TECH 0-60 MINS  03/09/2018   IR RADIOLOGIST EVAL & MGMT  07/21/2017   IR RADIOLOGIST EVAL & MGMT  11/14/2020   IR RADIOLOGIST EVAL & MGMT  11/27/2020   IR RADIOLOGIST EVAL & MGMT  01/24/2021   IR REMOVAL TUN CV CATH W/O FL  12/28/2020   IR TRANSCATH PLC STENT  INITIAL VEIN  INC ANGIOPLASTY  12/28/2020   IR US GUIDE VASC ACCESS LEFT  11/01/2020   IR US GUIDE VASC ACCESS LEFT  12/28/2020   IR US GUIDE VASC ACCESS LEFT   12/28/2020   IR US GUIDE VASC ACCESS LEFT  12/28/2020   IR US GUIDE VASC ACCESS RIGHT  11/01/2020   IR VENO/EXT/UNI LEFT  11/01/2020   IR VENOCAVAGRAM SVC  12/28/2020   LEFT HEART CATH AND CORONARY ANGIOGRAPHY N/A 06/26/2020   Procedure: LEFT HEART CATH AND CORONARY ANGIOGRAPHY;  Surgeon: Jolaine Artist, MD;  Location: [place] CV LAB;  Service: Cardiovascular;  Laterality: N/A;   ORIF PROXIMAL FEMORAL FRACTURE W/ ITST NAIL SYSTEM  03/2007   left, Dr. Shellia Carwin   RIGHT HEART CATH N/A 01/01/2022   Procedure: RIGHT HEART CATH;  Surgeon: Jolaine Artist, MD;  Location: [place] CV LAB;  Service: Cardiovascular;  Laterality: N/A;   SMALL INTESTINE SURGERY  2005   multiple with right colon resection for ischemia/infarct  TEE WITHOUT CARDIOVERSION N/A 01/14/2022   Procedure: TRANSESOPHAGEAL ECHOCARDIOGRAM (TEE);  Surgeon: [name], MD;  Location: [place];  Service: Cardiology;  Laterality: N/A;    Social History:  reports that she has never smoked. She has never used smokeless tobacco. She reports that she does not drink alcohol and does not use drugs.   Allergies  Allergen Reactions   Ivp Dye [Iodinated Contrast Media] Itching    Can use if taking benadryl     Family History  Problem Relation Age of Onset   Diabetes Mother    Hypertension Mother    AAA (abdominal aortic aneurysm) Mother    Colon cancer Neg Hx    Stomach cancer Neg Hx     Family history reviewed and not pertinent ***   Prior to Admission medications   Medication Sig Start Date End Date Taking? Authorizing Provider  acetaminophen (TYLENOL) 500 MG tablet Take 1,000 mg by mouth every 6 (six) hours as needed for mild pain.    [provider]  ADULT TPN Inject 1,800 mLs into the vein 4 (four) times a week. Pt receives home TPN from [facility]:  1800 mL bag, four nights weekly (Monday, Tuesday, Wednesday, Thursday for 8 hours (includes 1 hour taper up and down).    [provider]   Calcium Carb-Cholecalciferol (CALCIUM + D3 PO) Take 1 tablet by mouth in the morning and at bedtime. Morning & early evening.    [provider]  cetirizine (ZYRTEC) 10 MG tablet Take 10 mg by mouth daily as needed for allergies.    [provider]  clobetasol ointment (TEMOVATE) 3.41 % Apply 1 application  topically 2 (two) times daily as needed (irritation). 06/14/21   [provider]  diazepam (VALIUM) 2 MG tablet Take 1 tablet (2 mg total) by mouth every 6 (six) hours as needed for anxiety. Take one tablet one hour prior to scan for claustrophobia. Patient not taking: Reported on 01/15/2022 01/01/22   Gardenia Phlegm, NP  diphenhydrAMINE (BENADRYL) 25 mg capsule Take 25 mg by mouth every 6 (six) hours as needed for itching or sleep.    [provider]  empagliflozin (JARDIANCE) 10 MG TABS tablet Take 1 tablet (10 mg total) by mouth daily before breakfast. 06/11/21   Bensimhon, Shaune Pascal, MD  ergocalciferol (DRISDOL) 200 MCG/ML drops Take 6 mLs (48,000 Units total) by mouth 3 (three) times a week. 08/21/21   Gatha Mayer, MD  fluconazole (DIFLUCAN) 150 MG tablet Take 1 tablet today.  Take second tablet 3 days later. 01/04/22   Lynden Oxford Scales, PA-C  furosemide (LASIX) 40 MG tablet Take 2 tablets (80 mg total) by mouth daily. Patient taking differently: Take 40 mg by mouth 2 (two) times daily. Morning & early afternoon. 12/16/21   Bensimhon, Shaune Pascal, MD  Heparin Sodium, Porcine, (HEPARIN LOCK FLUSH IJ) Inject 5 mLs as directed 4 (four) times a week. Monday, Tuesday, Wednesday, Thursday in the morning with TPN    [provider]  losartan (COZAAR) 25 MG tablet Take 1 tablet (25 mg total) by mouth at bedtime. Patient taking differently: Take 12.5 mg by mouth at bedtime. 12/16/21   Bensimhon, Shaune Pascal, MD  omeprazole (PRILOSEC) 40 MG capsule TAKE 1 CAPSULE BY MOUTH EVERY DAY BEFORE BREAKFAST 12/27/21   Gatha Mayer, MD  ondansetron (ZOFRAN-ODT) 8  MG disintegrating tablet TAKE 1 TABLET BY MOUTH EVERY 8 HOURS AS NEEDED FOR NAUSEA OR VOMITING. 09/09/21   Silvano Rusk  E, MD  Pediatric Multivit-Minerals (FLINTSTONES GUMMIES PO) Take 4 tablets by mouth in the morning.    [provider]  potassium chloride SA (KLOR-CON M20) 20 MEQ tablet Take 1 tablet (20 mEq total) by mouth daily. 12/16/21   Bensimhon, Shaune Pascal, MD  sodium chloride 0.9 % infusion Inject 900 mLs into the vein every 14 (fourteen) days. Uses for TPN 10/27/17   [provider]  spironolactone (ALDACTONE) 25 MG tablet TAKE 1 TABLET (25 MG TOTAL) BY MOUTH DAILY. Patient taking differently: Take 25 mg by mouth in the morning. 08/28/21   Bensimhon, Shaune Pascal, MD  Teduglutide, rDNA, 5 MG KIT Inject 3.31 Units into the skin every morning. 10/17/21   Gatha Mayer, MD  triamcinolone cream (KENALOG) 0.1 % Apply 1 application topically as needed (itching).    [provider]  vitamin E 1000 UNIT capsule Take 1,000 Units by mouth in the morning.    [provider]  XARELTO 20 MG TABS tablet TAKE 1 TABLET BY MOUTH DAILY WITH SUPPER. 08/30/21   [name], MD     Objective    Physical Exam: Vitals:   01/21/22 2000 01/21/22 2130 01/21/22 2145 01/21/22 2303  BP: 123/61 (!) 112/48  (!) 119/53  Pulse: (!) 51 (!) 45  (!) 55  Resp: $Remo'20 12 19 18  'pSRUw$ Temp:   98.6 F (37 C) 97.8 F (36.6 C)  TempSrc:   Oral   SpO2: 99% 96%  100%  Weight:      Height:        General: appears to be stated age; alert, oriented Skin: warm, dry, no rash Head:  AT/[place] Mouth:  Oral mucosa membranes appear moist, normal dentition Neck: supple; trachea midline Heart:  RRR; did not appreciate any M/R/G Lungs: CTAB, did not appreciate any wheezes, rales, or rhonchi Abdomen: + BS; soft, ND, NT Vascular: 2+ pedal pulses b/l; 2+ radial pulses b/l Extremities: no peripheral edema, no muscle wasting Neuro: strength and sensation intact in upper and lower extremities  b/l ***   *** Neuro: 5/5 strength of the proximal and distal flexors and extensors of the upper and lower extremities bilaterally; sensation intact in upper and lower extremities b/l; cranial nerves II through XII grossly intact; no pronator drift; no evidence suggestive of slurred speech, dysarthria, or facial droop; Normal muscle tone. No tremors.  *** Neuro: In the setting of the patient's current mental status and associated inability to follow instructions, unable to perform full neurologic exam at this time.  As such, assessment of strength, sensation, and cranial nerves is limited at this time. Patient noted to spontaneously move all 4 extremities. No tremors.  ***    Labs on Admission: I have personally reviewed following labs and imaging studies  CBC: Recent Labs  Lab 01/15/22 1012 01/16/22 1454 01/21/22 1354  WBC 8.3 5.8 5.6  NEUTROABS  --  3.2  --   HGB 11.2* 10.9* 11.2*  HCT 35.2 33.3* 34.7*  MCV 92.6 91.7 92.3  PLT 119* 120* 93*   Basic Metabolic Panel: Recent Labs  Lab 01/16/22 1454 01/21/22 1216 01/21/22 1415  NA 134* 134* 134*  K 3.5 4.5 4.2  CL 112* 102 103  CO2 14* 25 22  GLUCOSE 84 76 76  BUN 20 27* 26*  CREATININE 1.60* 1.18 1.20*  CALCIUM 8.5* 9.1 8.5*  MG 1.7 2.1  --   PHOS 4.2 1.9*  --    GFR: Estimated Creatinine Clearance: 42 mL/min (A) (by C-G  formula based on SCr of 1.2 mg/dL (H)). Liver Function Tests: Recent Labs  Lab 01/16/22 1454 01/21/22 1216 01/21/22 1415  AST 16 24 32  ALT $Re'13 16 19  'aLw$ ALKPHOS 170* 145* 141*  BILITOT 0.4 0.4 0.6  PROT 8.6* 8.5* 8.1  ALBUMIN 3.3* 3.1* 2.6*   No results for input(s): "LIPASE", "AMYLASE" in the last 168 hours. Recent Labs  Lab 01/21/22 1605  AMMONIA 15   Coagulation Profile: Recent Labs  Lab 01/21/22 1407  INR 1.5*   Cardiac Enzymes: No results for input(s): "CKTOTAL", "CKMB", "CKMBINDEX", "TROPONINI" in the last 168 hours. BNP (last 3 results) No results for input(s): "PROBNP" in  the last 8760 hours. HbA1C: No results for input(s): "HGBA1C" in the last 72 hours. CBG: Recent Labs  Lab 01/21/22 1303  GLUCAP 74   Lipid Profile: No results for input(s): "CHOL", "HDL", "LDLCALC", "TRIG", "CHOLHDL", "LDLDIRECT" in the last 72 hours. Thyroid Function Tests: No results for input(s): "TSH", "T4TOTAL", "FREET4", "T3FREE", "THYROIDAB" in the last 72 hours. Anemia Panel: No results for input(s): "VITAMINB12", "FOLATE", "FERRITIN", "TIBC", "IRON", "RETICCTPCT" in the last 72 hours. Urine analysis:    Component Value Date/Time   COLORURINE YELLOW 01/21/2022 1556   APPEARANCEUR CLEAR 01/21/2022 1556   LABSPEC 1.010 01/21/2022 1556   PHURINE 6.0 01/21/2022 1556   GLUCOSEU NEGATIVE 01/21/2022 1556   HGBUR TRACE (A) 01/21/2022 1556   HGBUR trace-intact 04/12/2010 0958   BILIRUBINUR NEGATIVE 01/21/2022 1556   BILIRUBINUR negative 01/04/2022 1339   BILIRUBINUR neg 04/08/2011 0947   KETONESUR NEGATIVE 01/21/2022 1556   PROTEINUR 30 (A) 01/21/2022 1556   UROBILINOGEN 0.2 01/04/2022 1339   UROBILINOGEN 0.2 09/17/2012 1122   NITRITE NEGATIVE 01/21/2022 1556   LEUKOCYTESUR NEGATIVE 01/21/2022 1556    Radiological Exams on Admission: CT Angio Chest/Abd/Pel for Dissection W and/or Wo Contrast  Result Date: 01/21/2022 CLINICAL DATA:  Chest and back pain. EXAM: CT ANGIOGRAPHY CHEST, ABDOMEN AND PELVIS TECHNIQUE: Non-contrast CT of the chest was initially obtained. Multidetector CT imaging through the chest, abdomen and pelvis was performed using the standard protocol during bolus administration of intravenous contrast. Multiplanar reconstructed images and MIPs were obtained and reviewed to evaluate the vascular anatomy. RADIATION DOSE REDUCTION: This exam was performed according to the departmental dose-optimization program which includes automated exposure control, adjustment of the mA and/or kV according to patient size and/or use of iterative reconstruction technique. CONTRAST:   130mL OMNIPAQUE IOHEXOL 350 MG/ML SOLN COMPARISON:  CT PE protocol 12/09/2021 FINDINGS: CTA CHEST FINDINGS Cardiovascular: No evidence for aortic dissection or aneurysm. There are mild calcified atherosclerotic plaque throughout the aorta. There is no large central pulmonary embolism. The heart is moderately enlarged. There is no pericardial effusion. Left-sided central venous catheter ends in the right atrium. Mediastinum/Nodes: No enlarged mediastinal, hilar, or axillary lymph nodes. Thyroid gland, trachea, and esophagus demonstrate no significant findings. Lungs/Pleura: There is a ground-glass nodular density in the right upper lobe measuring 7 mm image 11/27. There is minimal atelectasis in the lung bases. The lungs are otherwise clear. No pleural effusion or pneumothorax. Musculoskeletal: No chest wall abnormality. No acute or significant osseous findings. Review of the MIP images confirms the above findings. CTA ABDOMEN AND PELVIS FINDINGS VASCULAR Aorta: Normal caliber aorta without aneurysm, dissection, vasculitis or significant stenosis. There are atherosclerotic calcifications throughout the aorta. Celiac: Patent without evidence of aneurysm, dissection, vasculitis or significant stenosis. SMA: Patent without evidence of aneurysm, dissection, vasculitis or significant stenosis. Renals: Limited evaluation secondary to artifact from patient's arms  and respiratory motion artifact. Grossly patent bilaterally. IMA: Not well seen. Inflow: Patent without evidence of aneurysm, dissection, vasculitis or significant stenosis. Veins: No obvious venous abnormality within the limitations of this arterial phase study. Review of the MIP images confirms the above findings. NON-VASCULAR Hepatobiliary: No focal liver abnormality is seen. Status post cholecystectomy. No biliary dilatation. Pancreas: Unremarkable. No pancreatic ductal dilatation or surrounding inflammatory changes. Spleen: Mildly enlarged. Adrenals/Urinary  Tract: Rounded hypodensity in the left kidney is too small to characterize, likely a cyst. Otherwise, the kidneys, adrenal glands and bladder are within normal limits. Stomach/Bowel: The colon is fluid filled. The cecum is midline and mildly dilated. Questionable stricture seen just proximal to the terminal ileum. No wall thickening or surrounding inflammation identified. The appendix is not seen. There is sigmoid colon diverticulosis. Small bowel and stomach are nondilated. No free air. Lymphatic: No enlarged lymph nodes. Reproductive: There is a 4.8 x 4.5 cm left adnexal cystic area. Right adnexa and uterus appear within normal limits. Other: No ascites or focal abdominal wall hernia. Musculoskeletal: No acute or significant osseous findings. Review of the MIP images confirms the above findings. IMPRESSION: 1. No evidence for aortic dissection or aneurysm. 2. Moderate cardiomegaly. 3. No acute cardiopulmonary process. 4. The cecum is dilated and fluid-filled and there is questionable stricture or mass within the colon just proximal to the terminal ileum. Recommend clinical correlation and follow-up. 5. Mild splenomegaly. 6. Subcentimeter left Bosniak 2 lesion, too small to characterize. No follow-up imaging is recommended. JACR 2018 Feb; 264-273, Management of the Incidental RenalMass on CT, [employer] 2021; 814-848, Bosniak Classification of Cystic Renal Masses, Version 2019. 7. 7 mm right ground-glass pulmonary nodule within the upper lobe. Per Fleischner Society Guidelines, recommend a non-contrast Chest CT at 6-12 months to confirm persistence, then additional non-contrast Chest CTs every 2 years until 5 years. If nodule grows or develops solid component(s), consider resection. These guidelines do not apply to immunocompromised patients and patients with cancer. Follow up in patients with significant comorbidities as clinically warranted. For lung cancer screening, adhere to Lung-RADS guidelines. Reference:  Radiology. 2017; 284(1):228-43. 8. 4.8 cm left adnexal simple-appearing cyst. Recommend follow-up pelvic US in 6-12 months. Reference: JACR 2020 Feb;17(2):248-254 Electronically Signed   By: Ronney Asters M.D.   On: 01/21/2022 17:26   DG Chest Port 1 View  Result Date: 01/21/2022 CLINICAL DATA:  Mental status changes. Possible sepsis. Recent exchange tunneled central venous catheter. EXAM: PORTABLE CHEST 1 VIEW COMPARISON:  12/09/2021 FINDINGS: Stable cardiac enlargement. Left jugular tunneled central line present with the catheter tip at the SVC/RA junction. There is no evidence of pulmonary edema, consolidation, pneumothorax, nodule or pleural fluid. IMPRESSION: Stable cardiac enlargement and positioning of tunneled left jugular central venous catheter. Electronically Signed   By: Aletta Edouard M.D.   On: 01/21/2022 14:16   CT Head Wo Contrast  Result Date: 01/21/2022 CLINICAL DATA:  Mental status changes of unknown cause, confusion EXAM: CT HEAD WITHOUT CONTRAST TECHNIQUE: Contiguous axial images were obtained from the base of the skull through the vertex without intravenous contrast. RADIATION DOSE REDUCTION: This exam was performed according to the departmental dose-optimization program which includes automated exposure control, adjustment of the mA and/or kV according to patient size and/or use of iterative reconstruction technique. COMPARISON:  CT head 07/23/2020 FINDINGS: Brain: Generalized atrophy. Normal ventricular morphology. No midline shift or mass effect. Small vessel chronic ischemic changes of deep cerebral white matter. No intracranial hemorrhage, mass lesion, evidence of acute infarction, or  extra-axial fluid collection. Vascular: No hyperdense vessels. Atherosclerotic calcification of internal carotid and vertebral arteries at skull base Skull: Demineralized but intact Sinuses/Orbits: Clear Other: N/A IMPRESSION: Atrophy with small vessel chronic ischemic changes of deep cerebral white  matter. No acute intracranial abnormalities. Electronically Signed   By: Lavonia Dana M.D.   On: 01/21/2022 13:53     EKG: Independently reviewed, with result as described above. ***   Assessment/Plan   Principal Problem:   Acute metabolic encephalopathy   ***       ***            ***             ***            ***            ***            ***         ***  DVT prophylaxis: SCD's ***  Code Status: Full code*** Family Communication: none*** Disposition Plan: Per Rounding Team Consults called: none***;  Admission status: ***    PLEASE NOTE THAT DRAGON DICTATION SOFTWARE WAS USED IN THE CONSTRUCTION OF THIS NOTE.   [place] DO [facility]  From [place]   01/21/2022, 11:26 PM   ***

## 2022-01-21 NOTE — Progress Notes (Signed)
Plan of Care Note for accepted transfer   Patient: [name] [name] MRN: [id number]   DOA: 01/21/2022  Facility requesting transfer: [facility] Requesting Provider: Evlyn Courier PA-C Reason for transfer: [place] course: 71 yo F with short bowel syndrome s/p bowel surgery, TPN, CHF, CVA of unknown etiology, multiple prior bacteremia in the setting PICC (Streptococcus pyogenes bacteremia in 06/2021, Klebsiella pneumonia bacteremia in 10/2020, Klebsiella pneumonia bacteremia in 11/2018 and 06/2016, Serratia marcescens bacteremia in 01/2015, 03/2014) followed by ID outpatient presents for AMS.   Patient had been following with ID outpt. for recurrent bacteremia last week. Patient had exchange of her line on 9/5 and refused to go to the hospital at [facility] recommendation after cultures grew GNR. Antibiotics switched from IV Ancef to IV cefepime on 01/16/2022. Last night became acutely confused which has worsened this morning.Only able to answer yes or no. Family says her line has been functioning.   No leukocytosis. Normal lactate. Negative flu/COVID PCR. Negative UA. Negative CXR.  CT head negative. No focal deficits on exam. However pt recently had stroke in August unknown etiology despite extensive workup. Compliant with Xarelto.   CTA chest/abd/pelvis with questionable stricture or mass in the colon proximal to ileum. ED PA discussed with [facility] Dr. Fuller Plan who will see in consultation.   Given the complexity of pt, unclear if she will need further imaging including MRI brain so will admit to [facility] since MRI will not be available at [facility] in the evening.  Also questions whether she could have cefepime encephalopathy and would consider changing antibiotics based on her culture sensitivity.  Plan of care: The patient is accepted for admission to Telemetry unit, at [facility] to continue care of this pt while she remains in ED.  Author: Orene Desanctis, DO 01/21/2022  Check [email or web address] for  on-call coverage.  Nursing staff, Please call [place] number on Amion as soon as patient's arrival, so appropriate admitting provider can evaluate the pt.

## 2022-01-21 NOTE — ED Notes (Signed)
Per ED MD verbal orders, may use PICC Line

## 2022-01-21 NOTE — ED Notes (Signed)
Unable to do in and out cath , Pt had already urinated , cleaned pt up and placed a purewick.

## 2022-01-21 NOTE — ED Notes (Signed)
Handoff given to [facility]. ETA 20 minutes.

## 2022-01-21 NOTE — ED Triage Notes (Signed)
States patient was moaning "all night last night". Woke up this morning saying "ok", talking to TV, confused. Took her for labs this morning, was unable to follow commands. LKW 2300. Follows commands, does not answer questions, says "ok".   Been on IV antibiotics x 5 days. Has PICC line for TPN. Had new one placed a week ago d/t previous line being infected.

## 2022-01-21 NOTE — ED Notes (Signed)
[facility] given transport paperwork, care taken over by [facility] at this time. Pt in no acute distress, VSS upon departure.

## 2022-01-22 ENCOUNTER — Observation Stay (HOSPITAL_COMMUNITY): Payer: Medicare Other

## 2022-01-22 ENCOUNTER — Ambulatory Visit: Payer: 59 | Admitting: Internal Medicine

## 2022-01-22 ENCOUNTER — Encounter (HOSPITAL_COMMUNITY): Payer: Self-pay | Admitting: Family Medicine

## 2022-01-22 ENCOUNTER — Other Ambulatory Visit: Payer: Self-pay

## 2022-01-22 DIAGNOSIS — D631 Anemia in chronic kidney disease: Secondary | ICD-10-CM | POA: Diagnosis present

## 2022-01-22 DIAGNOSIS — R4182 Altered mental status, unspecified: Secondary | ICD-10-CM

## 2022-01-22 DIAGNOSIS — Z86718 Personal history of other venous thrombosis and embolism: Secondary | ICD-10-CM | POA: Diagnosis not present

## 2022-01-22 DIAGNOSIS — G9389 Other specified disorders of brain: Secondary | ICD-10-CM | POA: Diagnosis present

## 2022-01-22 DIAGNOSIS — D638 Anemia in other chronic diseases classified elsewhere: Secondary | ICD-10-CM | POA: Diagnosis not present

## 2022-01-22 DIAGNOSIS — R933 Abnormal findings on diagnostic imaging of other parts of digestive tract: Secondary | ICD-10-CM | POA: Diagnosis not present

## 2022-01-22 DIAGNOSIS — I5022 Chronic systolic (congestive) heart failure: Secondary | ICD-10-CM | POA: Diagnosis not present

## 2022-01-22 DIAGNOSIS — G9341 Metabolic encephalopathy: Secondary | ICD-10-CM | POA: Diagnosis not present

## 2022-01-22 DIAGNOSIS — R7881 Bacteremia: Secondary | ICD-10-CM | POA: Diagnosis present

## 2022-01-22 DIAGNOSIS — T361X5A Adverse effect of cephalosporins and other beta-lactam antibiotics, initial encounter: Secondary | ICD-10-CM | POA: Diagnosis present

## 2022-01-22 DIAGNOSIS — Z20822 Contact with and (suspected) exposure to covid-19: Secondary | ICD-10-CM | POA: Diagnosis present

## 2022-01-22 DIAGNOSIS — R001 Bradycardia, unspecified: Secondary | ICD-10-CM | POA: Diagnosis present

## 2022-01-22 DIAGNOSIS — E8809 Other disorders of plasma-protein metabolism, not elsewhere classified: Secondary | ICD-10-CM | POA: Diagnosis present

## 2022-01-22 DIAGNOSIS — G928 Other toxic encephalopathy: Secondary | ICD-10-CM | POA: Diagnosis present

## 2022-01-22 DIAGNOSIS — Z91041 Radiographic dye allergy status: Secondary | ICD-10-CM | POA: Diagnosis not present

## 2022-01-22 DIAGNOSIS — D6859 Other primary thrombophilia: Secondary | ICD-10-CM | POA: Diagnosis present

## 2022-01-22 DIAGNOSIS — K219 Gastro-esophageal reflux disease without esophagitis: Secondary | ICD-10-CM | POA: Diagnosis present

## 2022-01-22 DIAGNOSIS — Z8673 Personal history of transient ischemic attack (TIA), and cerebral infarction without residual deficits: Secondary | ICD-10-CM | POA: Diagnosis not present

## 2022-01-22 DIAGNOSIS — I5042 Chronic combined systolic (congestive) and diastolic (congestive) heart failure: Secondary | ICD-10-CM | POA: Diagnosis present

## 2022-01-22 DIAGNOSIS — I080 Rheumatic disorders of both mitral and aortic valves: Secondary | ICD-10-CM | POA: Diagnosis present

## 2022-01-22 DIAGNOSIS — Z9049 Acquired absence of other specified parts of digestive tract: Secondary | ICD-10-CM | POA: Diagnosis not present

## 2022-01-22 DIAGNOSIS — K912 Postsurgical malabsorption, not elsewhere classified: Secondary | ICD-10-CM | POA: Diagnosis present

## 2022-01-22 DIAGNOSIS — K6389 Other specified diseases of intestine: Secondary | ICD-10-CM | POA: Diagnosis not present

## 2022-01-22 DIAGNOSIS — Z881 Allergy status to other antibiotic agents status: Secondary | ICD-10-CM | POA: Diagnosis not present

## 2022-01-22 DIAGNOSIS — N1831 Chronic kidney disease, stage 3a: Secondary | ICD-10-CM | POA: Diagnosis present

## 2022-01-22 DIAGNOSIS — R911 Solitary pulmonary nodule: Secondary | ICD-10-CM | POA: Diagnosis present

## 2022-01-22 DIAGNOSIS — D696 Thrombocytopenia, unspecified: Secondary | ICD-10-CM | POA: Diagnosis present

## 2022-01-22 DIAGNOSIS — Z8616 Personal history of COVID-19: Secondary | ICD-10-CM | POA: Diagnosis not present

## 2022-01-22 DIAGNOSIS — I272 Pulmonary hypertension, unspecified: Secondary | ICD-10-CM | POA: Diagnosis present

## 2022-01-22 DIAGNOSIS — E46 Unspecified protein-calorie malnutrition: Secondary | ICD-10-CM | POA: Diagnosis present

## 2022-01-22 LAB — CBC WITH DIFFERENTIAL/PLATELET
Abs Immature Granulocytes: 0.02 10*3/uL (ref 0.00–0.07)
Basophils Absolute: 0.1 10*3/uL (ref 0.0–0.1)
Basophils Relative: 1 %
Eosinophils Absolute: 0.7 10*3/uL — ABNORMAL HIGH (ref 0.0–0.5)
Eosinophils Relative: 10 %
HCT: 35.6 % — ABNORMAL LOW (ref 36.0–46.0)
Hemoglobin: 11.7 g/dL — ABNORMAL LOW (ref 12.0–15.0)
Immature Granulocytes: 0 %
Lymphocytes Relative: 34 %
Lymphs Abs: 2.5 10*3/uL (ref 0.7–4.0)
MCH: 30 pg (ref 26.0–34.0)
MCHC: 32.9 g/dL (ref 30.0–36.0)
MCV: 91.3 fL (ref 80.0–100.0)
Monocytes Absolute: 0.7 10*3/uL (ref 0.1–1.0)
Monocytes Relative: 10 %
Neutro Abs: 3.4 10*3/uL (ref 1.7–7.7)
Neutrophils Relative %: 45 %
Platelets: 115 10*3/uL — ABNORMAL LOW (ref 150–400)
RBC: 3.9 MIL/uL (ref 3.87–5.11)
RDW: 16.5 % — ABNORMAL HIGH (ref 11.5–15.5)
WBC: 7.4 10*3/uL (ref 4.0–10.5)
nRBC: 0 % (ref 0.0–0.2)

## 2022-01-22 LAB — BLOOD GAS, VENOUS
Acid-Base Excess: 3 mmol/L — ABNORMAL HIGH (ref 0.0–2.0)
Bicarbonate: 27.1 mmol/L (ref 20.0–28.0)
O2 Saturation: 25 %
Patient temperature: 37
pCO2, Ven: 39 mmHg — ABNORMAL LOW (ref 44–60)
pH, Ven: 7.45 — ABNORMAL HIGH (ref 7.25–7.43)
pO2, Ven: <31 mmHg — CL (ref 32–45)

## 2022-01-22 LAB — COMPREHENSIVE METABOLIC PANEL
ALT: 20 U/L (ref 0–44)
AST: 28 U/L (ref 15–41)
Albumin: 2.7 g/dL — ABNORMAL LOW (ref 3.5–5.0)
Alkaline Phosphatase: 145 U/L — ABNORMAL HIGH (ref 38–126)
Anion gap: 9 (ref 5–15)
BUN: 22 mg/dL (ref 8–23)
CO2: 23 mmol/L (ref 22–32)
Calcium: 8.9 mg/dL (ref 8.9–10.3)
Chloride: 103 mmol/L (ref 98–111)
Creatinine, Ser: 1.26 mg/dL — ABNORMAL HIGH (ref 0.44–1.00)
GFR, Estimated: 46 mL/min — ABNORMAL LOW (ref >60)
Glucose, Bld: 96 mg/dL (ref 70–99)
Potassium: 3.9 mmol/L (ref 3.5–5.1)
Sodium: 135 mmol/L (ref 135–145)
Total Bilirubin: 0.7 mg/dL (ref 0.3–1.2)
Total Protein: 8.5 g/dL — ABNORMAL HIGH (ref 6.5–8.1)

## 2022-01-22 LAB — SEDIMENTATION RATE: Sed Rate: 40 mm/h — ABNORMAL HIGH (ref 0–22)

## 2022-01-22 LAB — URINE CULTURE: Culture: NO GROWTH

## 2022-01-22 LAB — FOLATE: Folate: 24.1 ng/mL (ref >5.9)

## 2022-01-22 LAB — VITAMIN B12: Vitamin B-12: 551 pg/mL (ref 180–914)

## 2022-01-22 LAB — MAGNESIUM: Magnesium: 2.1 mg/dL (ref 1.7–2.4)

## 2022-01-22 LAB — GLUCOSE, CAPILLARY: Glucose-Capillary: 120 mg/dL — ABNORMAL HIGH (ref 70–99)

## 2022-01-22 LAB — TSH: TSH: 3.62 u[IU]/mL (ref 0.350–4.500)

## 2022-01-22 LAB — PHOSPHORUS: Phosphorus: 3 mg/dL (ref 2.5–4.6)

## 2022-01-22 LAB — TRIGLYCERIDES: Triglycerides: 224 mg/dL — ABNORMAL HIGH (ref <150)

## 2022-01-22 MED ORDER — LORAZEPAM 2 MG/ML IJ SOLN
0.5000 mg | Freq: Four times a day (QID) | INTRAMUSCULAR | Status: DC | PRN
  Administered 2022-01-22: 0.5 mg via INTRAVENOUS
  Filled 2022-01-22: qty 1

## 2022-01-22 MED ORDER — PANTOPRAZOLE SODIUM 40 MG PO TBEC: 40.0000 mg | DELAYED_RELEASE_TABLET | Freq: Every day | ORAL | Status: DC

## 2022-01-22 MED ORDER — SPIRONOLACTONE 25 MG PO TABS: 25.0000 mg | ORAL_TABLET | Freq: Every day | ORAL | Status: DC

## 2022-01-22 MED ORDER — FUROSEMIDE 40 MG PO TABS: 40.0000 mg | ORAL_TABLET | Freq: Two times a day (BID) | ORAL | Status: DC

## 2022-01-22 MED ORDER — TRAVASOL 10 % IV SOLN
INTRAVENOUS | Status: AC
  Filled 2022-01-22: qty 800

## 2022-01-22 MED ORDER — HALOPERIDOL LACTATE 5 MG/ML IJ SOLN
2.0000 mg | Freq: Four times a day (QID) | INTRAMUSCULAR | Status: DC | PRN
  Administered 2022-01-22: 2 mg via INTRAVENOUS
  Filled 2022-01-22: qty 1

## 2022-01-22 MED ORDER — PANTOPRAZOLE SODIUM 40 MG IV SOLR
40.0000 mg | Freq: Every day | INTRAVENOUS | Status: DC
  Administered 2022-01-22: 40 mg via INTRAVENOUS
  Filled 2022-01-22: qty 10

## 2022-01-22 MED ORDER — RIVAROXABAN 20 MG PO TABS: 20.0000 mg | ORAL_TABLET | Freq: Every day | ORAL | Status: DC

## 2022-01-22 MED ORDER — FUROSEMIDE 10 MG/ML IJ SOLN
20.0000 mg | Freq: Two times a day (BID) | INTRAMUSCULAR | Status: DC
  Administered 2022-01-22: 20 mg via INTRAVENOUS
  Filled 2022-01-22: qty 2

## 2022-01-22 MED ORDER — INSULIN ASPART 100 UNIT/ML IJ SOLN
0.0000 [IU] | Freq: Four times a day (QID) | INTRAMUSCULAR | Status: DC
  Administered 2022-01-23: 5 [IU] via SUBCUTANEOUS
  Administered 2022-01-23: 7 [IU] via SUBCUTANEOUS

## 2022-01-22 MED ORDER — LOSARTAN POTASSIUM 25 MG PO TABS
12.5000 mg | ORAL_TABLET | Freq: Every day | ORAL | Status: DC
  Filled 2022-01-22: qty 0.5

## 2022-01-22 MED ORDER — DIPHENHYDRAMINE HCL 50 MG/ML IJ SOLN
12.5000 mg | Freq: Three times a day (TID) | INTRAMUSCULAR | Status: DC | PRN
  Administered 2022-01-22: 12.5 mg via INTRAVENOUS
  Filled 2022-01-22: qty 1

## 2022-01-22 MED ORDER — CETAPHIL MOISTURIZING EX LOTN
TOPICAL_LOTION | Freq: Two times a day (BID) | CUTANEOUS | Status: DC
  Filled 2022-01-22: qty 473

## 2022-01-22 MED ORDER — ORAL CARE MOUTH RINSE: 15.0000 mL | OROMUCOSAL | Status: DC | PRN

## 2022-01-22 MED ORDER — LORAZEPAM 2 MG/ML IJ SOLN
0.5000 mg | INTRAMUSCULAR | Status: DC | PRN
  Administered 2022-01-22: 0.5 mg via INTRAVENOUS
  Filled 2022-01-22 (×2): qty 1

## 2022-01-22 MED ORDER — HEPARIN (PORCINE) 25000 UT/250ML-% IV SOLN
1100.0000 [IU]/h | INTRAVENOUS | Status: AC
  Administered 2022-01-22: 900 [IU]/h via INTRAVENOUS
  Filled 2022-01-22: qty 250

## 2022-01-22 MED ORDER — ACETAMINOPHEN 10 MG/ML IV SOLN
1000.0000 mg | Freq: Four times a day (QID) | INTRAVENOUS | Status: DC | PRN
  Administered 2022-01-22: 1000 mg via INTRAVENOUS
  Filled 2022-01-22: qty 100

## 2022-01-22 MED ORDER — SODIUM CHLORIDE 0.9% FLUSH: 10.0000 mL | INTRAVENOUS | Status: DC | PRN

## 2022-01-22 MED ORDER — SODIUM CHLORIDE 0.9 % IV SOLN
2.0000 g | Freq: Two times a day (BID) | INTRAVENOUS | Status: DC
  Administered 2022-01-22: 2 g via INTRAVENOUS
  Filled 2022-01-22: qty 12.5

## 2022-01-22 MED ORDER — TEDUGLUTIDE (RDNA) 5 MG ~~LOC~~ KIT: 3.3100 [IU] | PACK | Freq: Every morning | SUBCUTANEOUS | Status: DC

## 2022-01-22 MED ORDER — CHLORHEXIDINE GLUCONATE CLOTH 2 % EX PADS
6.0000 | MEDICATED_PAD | Freq: Every day | CUTANEOUS | Status: DC
  Administered 2022-01-22 – 2022-01-23 (×2): 6 via TOPICAL

## 2022-01-22 NOTE — Consult Note (Signed)
[place] for Infectious Disease       Reason for Consult: confusion   Referring Physician: Dr. Bonner Puna  Principal Problem:   Acute metabolic encephalopathy Active Problems:   Anemia of chronic disease   Acquired short bowel syndrome   GERD (gastroesophageal reflux disease)   Colonic mass   Chronic systolic heart failure (HCC) - LVEF 35-40%   Stage 3a chronic kidney disease (CKD) (HCC)   Bacteremia    Chlorhexidine Gluconate Cloth  6 each Topical Daily   furosemide  40 mg Oral BID   losartan  12.5 mg Oral QHS   pantoprazole  40 mg Oral Daily   rivaroxaban  20 mg Oral Q supper   spironolactone  25 mg Oral q AM    Recommendations: Stop cefepime Will observe off of antibiotics  MRI of brain when able to get  Assessment: New confusion of unknown etiology.  Cefepime is a possible cause which can occur with high levels, though she was receiving the appropriate renal function adjusted dosage and has only been on it for about 5 days. No other signs of new infection.  Cefepime has been stopped and I recommend to rule out other possible etiologies.  Recent MRI with ischemia.  Concern for PRES. I do not suspect an acute infection such as encephalitis with no fever, no leukocytosis.    Antibiotics: cefepime  HPI: [name] [name] is a 71 y.o. female with a history of short gut syndrome, TPN dependent and subsequent line dependent here with confusion.  She was seen in the office by my partner last week for concern for chills, fever and blood cultures did grow out Enterobacter.  She had a line exchanged at that time by Dr. Pascal Lux and started 9/8 on cefepime.  She improved but starting about 9/11 started to have progressive confusion and lethargy.  No fever, no chills.  No similar episode before.  No noted weakness.     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   Carotid stenosis    Carotid US (9/15):  R 40-59%; L 1-39% >> FU 1 year   Cataract    Closed left subtrochanteric femur fracture ([place]) 12/31/2017   Clotting disorder ([place])    Congestive heart failure (CHF) (HCC)    EF 25-30%   COVID-19 2022   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   Fever 10/29/2020   Fracture of left clavicle    GERD (gastroesophageal reflux disease)    History of blood transfusion 2013   anemia   Hx of cardiovascular stress test    Myoview (9/15):  inf-apical scar; no ischemia; EF 47% - low risk    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 12/12/2011   Osteoporosis    Pancytopenia 10/07/2011   Pathologic fracture of neck of femur ([place])    Personal history of colonic polyps    Renal insufficiency    hx of yrs ago   Serratia marcescens infection - bactermia assoc w/ PICC 01/18/2015   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HCC)    Splenomegaly    By ultrasound   Stroke ([place]) 07/2020   Thrombophilia (HCC)    Vitamin D deficiency     Social History   Tobacco Use   Smoking status: Never   Smokeless tobacco: Never  Vaping Use   Vaping Use: Never used  Substance Use Topics   Alcohol use: Never   Drug use: Never    Family History  Problem Relation Age of Onset   Diabetes Mother    Hypertension Mother    AAA (abdominal aortic aneurysm) Mother    Colon cancer Neg Hx    Stomach cancer Neg Hx     Allergies  Allergen Reactions   Ivp Dye [Iodinated Contrast Media] Itching    Can use if taking benadryl     Physical Exam: Constitutional: in no apparent distress  Vitals:   01/22/22 0200 01/22/22 0422  BP: (!) 116/48 115/65   Pulse: 65 62  Resp:  15  Temp: 98 F (36.7 C) 98 F (36.7 C)  SpO2: 99% 97%   EYES: anicteric Respiratory: normal respiratory effort Musculoskeletal: no edema Skin: no rash  Lab Results  Component Value Date   WBC 7.4 01/22/2022   HGB 11.7 (L) 01/22/2022   HCT 35.6 (L) 01/22/2022   MCV 91.3 01/22/2022   PLT 115 (L) 01/22/2022    Lab Results  Component Value Date   CREATININE 1.26 (H) 01/22/2022   BUN 22 01/22/2022   NA 135 01/22/2022   K 3.9 01/22/2022   CL 103 01/22/2022   CO2 23 01/22/2022    Lab Results  Component Value Date   ALT 20 01/22/2022   AST 28 01/22/2022   ALKPHOS 145 (H) 01/22/2022     Microbiology: Recent Results (from the past 240 hour(s))  Blood culture (routine single)     Status: Abnormal   Collection Time: 01/15/22 10:12 AM   Specimen: Blood  Result Value Ref Range Status   MICRO NUMBER: [id number]  Final   SPECIMEN QUALITY: Adequate  Final   Source BLOOD 1  Final   STATUS: FINAL  Final   ISOLATE 1: Enterobacter aerogenes (AA)  Final    Comment: Klebsiella aerogenes (Enterobacter) from aerobic bottle only   COMMENT: Aerobic and anaerobic bottle received.  Final      Susceptibility   Enterobacter aerogenes - BLOOD CULTURE, NEGATIVE 1    AMOX/CLAVULANIC >=32 Resistant     CEFAZOLIN* >=64 Resistant      * For infections other than uncomplicated UTI caused by E. coli, K. pneumoniae or P. mirabilis: Cefazolin is resistant if MIC > or = 8 mcg/mL. (Distinguishing susceptible versus intermediate for isolates with MIC < or = 4 mcg/mL requires additional testing.)     CEFTAZIDIME >=64 Resistant     CEFEPIME <=1 Sensitive     CEFTRIAXONE 16 Resistant     CIPROFLOXACIN <=0.25 Sensitive     LEVOFLOXACIN <=0.12 Sensitive     GENTAMICIN <=1 Sensitive     IMIPENEM 1 Sensitive     PIP/TAZO >=128 Resistant     TOBRAMYCIN <=1 Sensitive     TRIMETH/SULFA* <=20 Sensitive      * For infections other than uncomplicated UTI caused by E. coli, K.  pneumoniae or P. mirabilis: Cefazolin is resistant if MIC > or = 8 mcg/mL. (Distinguishing susceptible versus intermediate for isolates with MIC < or = 4 mcg/mL requires additional testing.) Legend: S = Susceptible  I =  Intermediate R = Resistant  NS = Not susceptible * = Not tested  NR = Not reported **NN = See antimicrobic comments   Blood culture (routine single)     Status: Abnormal   Collection Time: 01/15/22 10:12 AM   Specimen: Blood  Result Value Ref Range Status   MICRO NUMBER: [id number]  Final   SPECIMEN QUALITY: Adequate  Final   Source BLOOD 2  Final   STATUS: FINAL  Final   ISOLATE 1: Enterobacter aerogenes (AA)  Final    Comment: Klebsiella aerogenes (Enterobacter) from aerobic bottle only   COMMENT: Aerobic and anaerobic bottle received.  Final      Susceptibility   Enterobacter aerogenes - BLOOD CULTURE, NEGATIVE 1    AMOX/CLAVULANIC >=32 Resistant     CEFAZOLIN* >=64 Resistant      * For infections other than uncomplicated UTI caused by E. coli, K. pneumoniae or P. mirabilis: Cefazolin is resistant if MIC > or = 8 mcg/mL. (Distinguishing susceptible versus intermediate for isolates with MIC < or = 4 mcg/mL requires additional testing.)     CEFTAZIDIME >=64 Resistant     CEFEPIME <=1 Sensitive     CEFTRIAXONE 16 Resistant     CIPROFLOXACIN <=0.25 Sensitive     LEVOFLOXACIN <=0.12 Sensitive     GENTAMICIN <=1 Sensitive     IMIPENEM 1 Sensitive     PIP/TAZO >=128 Resistant     TOBRAMYCIN <=1 Sensitive     TRIMETH/SULFA* <=20 Sensitive      * For infections other than uncomplicated UTI caused by E. coli, K. pneumoniae or P. mirabilis: Cefazolin is resistant if MIC > or = 8 mcg/mL. (Distinguishing susceptible versus intermediate for isolates with MIC < or = 4 mcg/mL requires additional testing.) Legend: S = Susceptible  I = Intermediate R = Resistant  NS = Not susceptible * = Not tested  NR = Not reported **NN = See antimicrobic comments   Blood  Culture (routine x 2)     Status: None (Preliminary result)   Collection Time: 01/21/22  2:07 PM   Specimen: BLOOD  Result Value Ref Range Status   Specimen Description BLOOD BLOOD RIGHT HAND  Final   Special Requests   Final    BOTTLES DRAWN AEROBIC AND ANAEROBIC Blood Culture results may not be optimal due to an inadequate volume of blood received in culture bottles   Culture   Final    NO GROWTH < 24 HOURS Performed at [facility], [place] [address] [place], [place] [address]    Report Status PENDING  Incomplete  Resp Panel by RT-PCR (Flu A&B, Covid) Anterior Nasal Swab     Status: None   Collection Time: 01/21/22  2:15 PM   Specimen: Anterior Nasal Swab  Result Value Ref Range Status   SARS Coronavirus 2 by RT PCR NEGATIVE NEGATIVE Final    Comment: (NOTE) SARS-CoV-2 target nucleic acids are NOT DETECTED.  The SARS-CoV-2 RNA is generally detectable in upper respiratory specimens during the acute phase of infection. The lowest concentration of SARS-CoV-2 viral copies this assay can detect is 138 copies/mL. A negative result does not preclude SARS-Cov-2 infection and should not be used as the sole basis for treatment or other patient management decisions. A negative result may occur with  improper specimen collection/handling, submission of specimen other than nasopharyngeal swab, presence of viral mutation(s) within the areas targeted by this assay, and inadequate number of viral copies(<138 copies/mL). A negative result must be combined with  clinical observations, patient history, and epidemiological information. The expected result is Negative.  Fact Sheet for Patients:  EntrepreneurPulse.com.au  Fact Sheet for Healthcare Providers:  IncredibleEmployment.be  This test is no t yet approved or cleared by the [place] FDA and  has been authorized for detection and/or diagnosis of SARS-CoV-2 by FDA under an Emergency Use  Authorization (EUA). This EUA will remain  in effect (meaning this test can be used) for the duration of the COVID-19 declaration under Section 564(b)(1) of the Act, 21 U.S.C.section 360bbb-3(b)(1), unless the authorization is terminated  or revoked sooner.       Influenza A by PCR NEGATIVE NEGATIVE Final   Influenza B by PCR NEGATIVE NEGATIVE Final    Comment: (NOTE) The Xpert Xpress SARS-CoV-2/FLU/RSV plus assay is intended as an aid in the diagnosis of influenza from Nasopharyngeal swab specimens and should not be used as a sole basis for treatment. Nasal washings and aspirates are unacceptable for Xpert Xpress SARS-CoV-2/FLU/RSV testing.  Fact Sheet for Patients: EntrepreneurPulse.com.au  Fact Sheet for Healthcare Providers: IncredibleEmployment.be  This test is not yet approved or cleared by the [place] FDA and has been authorized for detection and/or diagnosis of SARS-CoV-2 by FDA under an Emergency Use Authorization (EUA). This EUA will remain in effect (meaning this test can be used) for the duration of the COVID-19 declaration under Section 564(b)(1) of the Act, 21 U.S.C. section 360bbb-3(b)(1), unless the authorization is terminated or revoked.  Performed at [facility] [place], [address] [place], [place] [address]   Blood Culture (routine x 2)     Status: None (Preliminary result)   Collection Time: 01/21/22  5:38 PM   Specimen: BLOOD RIGHT ARM  Result Value Ref Range Status   Specimen Description   Final    BLOOD RIGHT ARM Performed at [facility], [address] [place], [place] [address]    Special Requests   Final    BOTTLES DRAWN AEROBIC AND ANAEROBIC Blood Culture adequate volume Performed at [facility], [address] [place], [place] [address]    Culture   Final    NO GROWTH < 12 HOURS Performed at [facility], [place] [address], [place], [place] [address]    Report  Status PENDING  Incomplete    Thayer Headings, [place] for Infectious Disease [place] [email or web address] 01/22/2022, 9:51 AM

## 2022-01-22 NOTE — Progress Notes (Signed)
ANTICOAGULATION CONSULT NOTE - Initial Consult  Pharmacy Consult for Heparin (Xarelto on hold) Indication: hx multiple DVTs  Allergies  Allergen Reactions   Ivp Dye [Iodinated Contrast Media] Itching    Can use if taking benadryl     Patient Measurements: Height: '5\' 8"'$  (172.7 cm) Weight: 65.3 kg (143 lb 15.4 oz) IBW/kg (Calculated) : 63.9 Heparin Dosing Weight: 65 kg  Vital Signs: Temp: 98 F (36.7 C) (09/13 0422) Temp Source: Oral (09/13 0422) BP: 115/65 (09/13 0422) Pulse Rate: 62 (09/13 0422)  Labs: Recent Labs    01/21/22 1216 01/21/22 1354 01/21/22 1407 01/21/22 1415 01/21/22 1600 01/21/22 1738 01/22/22 0143  HGB  --  11.2*  --   --   --   --  11.7*  HCT  --  34.7*  --   --   --   --  35.6*  PLT  --  93*  --   --   --   --  115*  APTT  --   --  34  --   --   --   --   LABPROT  --   --  18.2*  --   --   --   --   INR  --   --  1.5*  --   --   --   --   CREATININE 1.18  --   --  1.20*  --   --  1.26*  TROPONINIHS  --   --   --   --  33* 32*  --     Estimated Creatinine Clearance: 41.3 mL/min (A) (by C-G formula based on SCr of 1.26 mg/dL (H)).   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   Carnitine deficiency ([place]) 05/25/2018   Carotid stenosis    Carotid US (9/15):  R 40-59%; L 1-39% >> FU 1 year   Cataract    Closed left subtrochanteric femur fracture (HCC) 12/31/2017   Clotting disorder (HCC)    Congestive heart failure (CHF) (HCC)    EF 25-30%   COVID-19 2022   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   Fever 10/29/2020   Fracture of left clavicle    GERD (gastroesophageal reflux disease)    History of blood  transfusion 2013   anemia   Hx of cardiovascular stress test    Myoview (9/15):  inf-apical scar; no ischemia; EF 47% - low risk    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  Xarelto and other  PO meds held.  To begin IV heparin.     Last Xarelto dose 9/11. Recent doses may falsely elevate heparin levels. Will use aPTTs for heparin monitoring until correlating with heparin levels.  Chronic thrombocytopenia.  Platelet counts seems to run in 60s-120.  No bleeding reported.  Goal of Therapy:  Heparin level 0.3-0.7 units/ml aPTT 66-102 seconds Monitor platelets by anticoagulation protocol: Yes   Plan:  Begin heparin drip at 900 units/hr aPTT and heparin level ~8 hrs after infusion starts. Daily aPTT, heparin level and CBC; watch low platelet count. Monitor for signs/symptoms of bleeding. Xarelto on hold.  Arty Baumgartner, RPh 01/22/2022,2:47 PM

## 2022-01-22 NOTE — Progress Notes (Signed)
Pt has MRI ordered, could not be completed due to pt frequent movement and agitation. Bonner Puna, MD notified.

## 2022-01-22 NOTE — Progress Notes (Signed)
Pharmacy Antibiotic Note  [name] [name] is a 71 y.o. female admitted on 01/21/2022 with  AMS . Pt recently seen in ID clinic and started on cefepime for Enterobacter bacteremia. Pharmacy has been consulted for cefepime dosing. CrCl ~42 ml/min.  Plan: Cefepime 2g IV q12h   Height: '5\' 8"'$  (172.7 cm) Weight: 61.9 kg (136 lb 6.4 oz) IBW/kg (Calculated) : 63.9  Temp (24hrs), Avg:98.4 F (36.9 C), Min:97.8 F (36.6 C), Max:98.6 F (37 C)  Recent Labs  Lab 01/15/22 1012 01/16/22 1454 01/21/22 1216 01/21/22 1354 01/21/22 1407 01/21/22 1415 01/21/22 1600  WBC 8.3 5.8  --  5.6  --   --   --   CREATININE  --  1.60* 1.18  --   --  1.20*  --   LATICACIDVEN  --   --   --   --  1.9  --  1.4    Estimated Creatinine Clearance: 42 mL/min (A) (by C-G formula based on SCr of 1.2 mg/dL (H)).    Allergies  Allergen Reactions   Ivp Dye [Iodinated Contrast Media] Itching    Can use if taking benadryl     [name] [name], PharmD, BCPS, [facility] Clinical Pharmacist [phone] Please check AMION for all [place] numbers 01/22/2022

## 2022-01-22 NOTE — Progress Notes (Signed)
LTM EEG hooked up and running - no initial skin breakdown - push button tested - neuro notified. [facility] monitoring.  

## 2022-01-22 NOTE — Progress Notes (Signed)
Attempted to scan pt in MRI. Pt unable to tolerate scan due to AMS. Any kind if stimulus sets pt off, including the noise of the scanner.  She began yelling help, failing her legs and grabbing the head coil as soon as I started the scout images.

## 2022-01-22 NOTE — Consult Note (Addendum)
Attending physician's note   I have taken a history, reviewed the chart, and examined the patient. I performed a substantive portion of this encounter, including complete performance of at least one of the key components, in conjunction with the APP. I agree with the APP's note, impression, and recommendations with my edits.   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  GI service consulted due to abnormal imaging.  - CTA C/A/P: Dilated, fluid-filled cecum with ?stricture vs mass just proximal to terminal ileum  I discussed these CT findings with the body imaging Radiologist today.  Given her surgical history, this seems much more likely to be peristalsis and possibly some postoperative changes, along with some motion artifact.  There is otherwise no upstream dilatation, wall thickening, or  e/o obstruction or inflammation.  She actually does not have a cecum, but rather a jejunal colonic anastomosis.  I additionally reviewed her recent colonoscopy from 09/2021 which demonstrates a patent jejunal colonic anastomosis in the ascending colon.  She is otherwise without GI symptoms, to include abdominal pain, nausea/vomiting, change in bowel habits.  - No plan for inpatient repeat colonoscopy - Out of abundance of caution, could reevaluate with outpatient CT with oral contrast or CT enterography to ensure resolution.  Do not need to repeat as an inpatient. - Antimicrobial therapy per primary Hospitalist service and consulting ID service - Inpatient GI service will sign off at this time.  Please do not hesitate to contact us with additional questions or concerns or if we can be of  further assistance.  [name] Heck, DO, [place] [phone] office                                                                                   [facility] Consult: 8:12 AM 01/22/2022  LOS: 0 days    Referring Provider: Dr Bonner [name]  Primary Care Physician:  [name] Minium, MD Primary Gastroenterologist:  [name] [name]     Reason for Consultation:  ? SB stricture.   HPI: [name] [name] is a 71 y.o. female.  PMH multiple small bowel, right colon resections around 2005 for infarcted bowel.  Subsequent short bowel syndrome. Maintained on TPN > 15 years, Gatex.  SB bacterial overgrowth.  Multiple nutritional deficiencies including IDA.  Splenomegaly, thrombocytopenia (several readings in the 60s this year).  CVA 2022.  Hypercoagulable disorder w upper extremity DVT, on chronic Xarelto.  Heart failure.  Ischemic CM.  L BBB.   Infectious disease hx:  Shiga like toxin producing E. coli on stool PCR 06/2021.  Klebsiella bacteremia 2013.  Strep pyogenes bacteremia 06/2021.  Klebsiella bacteremia 2018, 2020, 2022.  Serratia bacteremia 2015, 2016  Short gut syndrome.   Hx colon polyps:  2005 - 2 serrated adenomas 2007 - no polyps 04/2012 - 6 mm sessile serrated polyp sigmoid 09/2021 colonoscopy:  2, 5 to 12 mm sized polyps removed from transverse, ascending colon, polypectomy sites clipped.  Pandiverticulosis.  Patent jejununo-colonic anastomosis with healthy  mucosa.  Difficult insertion requiring abdominal pressure with suggestion to use abdominal binder for future scopes.    Path: Benign adenoma, SSP w low to high-grade dysplasia.  Recommend colonoscopy 6 -12 months to ensure complete removal of SSP 09/2021 EGD.  Evaluation anemia.  Diffuse mild gastritis.  Path: Reactive gastropathy, mild chronic gastritis without H. pylori, metaplasia etc.  At latest in person GI office visit in May for evaluation anemia and history colon polyps patient's weight 150 lb/68 kg, (143 lb/64.9 kg  on 8/23).  She then underwent above EGD, colonoscopy  01/01/22 cardiac cath: Elevated left-sided filling pressures with mild to moderate pulmonary venous HTN and normal output.  Prominent v-waves in PCWP tracing suggestive of significant MR.  [name] [name] planned to increase diuretics, structural heart consult. 01/14/2022 TEE: LVEF 30 to 35%.  Severe left atrial dilation.  Moderate to severe MR.  Moderate aortic valve regurg and mild stenosis.  Moderate plaque of aortic arch and descending aorta.  Bubble study positive suggesting intrapulmonary shunting.  No vegetation on the indwelling catheter tip at right atrium Cardiologist [name] [name] feels patient is suitable for MitraClip.  On 01/14/2022 IR exchanged her central line.  She had been experiencing fevers, chills.  01/15/2022 office visit with ID for evaluation of shaking chills.  Diagnosed as powerline infection.  Initiated empiric Home administration of cefazolin (ultiimately changed to cefepime) for 1 week and given Rocephin dose in the office.  Initial blood cultures GNR positive and [name] [name] advised her to go to ED for progressive symptoms.  Ultimately 2/2 blood culture were positive for Enterobacter.  Patient's TPN infusion providers wondering if she should have an antimicrobial lock rather than a simple heparin lock given that this is her second line infection in 2023.   Patient developed altered mental status with confusion, moaning during the hours of sleep Monday through Tuesday.  Presented for evaluation to the ED.  The mental status changes have not cleared, family notes she is sleeping a lot more than usual.  She does not recognize family members, her husband, daughter.  Moans but denies pain.  Some left-sided abdominal tenderness.  No nausea or vomiting.  No change in baseline watery stools.  Up until she became confused her appetite was excellent.  CT angio chest/abd/pelvis: 7 mm RUL density (47mm and stable by 7/31 CT).  Mild splenomegaly  (stable splenomegaly on 09/2020 ultrasound).  Pancreas, liver unremarkable.  Post cholecystectomy.  Fluid-filled colon with mildly dilated midline cecum.  [name] [name] of stricture proximal to TI.  No intestinal wall thickening or inflammation.  Sigmoid diverticulosis.  4.8 x 4.5 cm L adnexal cyst.  Vascular calcifications in aorta wo aneurysms or vascular dz though IMA poorly vis.   ST head w chronic ischemic sm vsl changes in white matter.    WBCs normal.  Hgb 11.7 with normal MCV.  Platelets 115    Family history diabetes, hypertension, AAA.  No family history of gastrointestinal cancers. 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   Carnitine deficiency (HCC) 05/25/2018   Carotid stenosis    Carotid US (9/15):  R 40-59%; L 1-39% >> FU 1 year   Cataract    Closed left subtrochanteric femur fracture (HCC) 12/31/2017   Clotting disorder (HCC)    Congestive heart failure (CHF) (HCC)    EF 25-30%   COVID-19 2022   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   Fever 10/29/2020   Fracture of left clavicle    GERD (gastroesophageal reflux disease)    History of blood transfusion 2013   anemia   Hx of cardiovascular stress test    Myoview (9/15):  inf-apical scar; no ischemia; EF 47% - low risk    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 12/12/2011   Osteoporosis    Pancytopenia 10/07/2011   Pathologic fracture of neck of femur (HCC)    Personal history of  colonic polyps    Renal insufficiency    hx of yrs ago   Serratia marcescens infection - bactermia assoc w/ PICC 01/18/2015   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HCC)    Splenomegaly    By ultrasound   Stroke (HCC) 07/2020   Thrombophilia (HCC)    Vitamin D deficiency     Past Surgical History:  Procedure Laterality Date   APPENDECTOMY  yrs ago   BUBBLE STUDY  01/14/2022   Procedure: BUBBLE STUDY;  Surgeon: Parke Poisson, MD;  Location: [facility] ENDOSCOPY;  Service: Cardiology;;   CHOLECYSTECTOMY  yrs ago   COLONOSCOPY  12/05/2005   internal hemorrhoids (for polyp surveillance)   COLONOSCOPY  04/26/2012   Procedure: COLONOSCOPY;  Surgeon: Iva Boop, MD;  Location: [facility] ENDOSCOPY;  Service: Endoscopy;  Laterality: N/A;   COLONOSCOPY WITH PROPOFOL N/A 03/18/2016   Procedure: COLONOSCOPY WITH PROPOFOL;  Surgeon: Iva Boop, MD;  Location: [facility] ENDOSCOPY;  Service: Endoscopy;  Laterality: N/A;   ESOPHAGOGASTRODUODENOSCOPY  01/22/2009   erosive esophagitis   HARDWARE REMOVAL Left 12/31/2017   Procedure: HARDWARE REMOVAL;  Surgeon: Samson Frederic, MD;  Location: [facility] ORS;  Service: Orthopedics;  Laterality: Left;   INTRAMEDULLARY (IM) NAIL INTERTROCHANTERIC Left 12/31/2017   Procedure: INTRAMEDULLARY (IM) NAIL SUBTROCHANTRIC;  Surgeon: Samson Frederic, MD;  Location: [facility] ORS;  Service: Orthopedics;  Laterality: Left;   IR CV LINE INJECTION  03/23/2018   IR CV LINE INJECTION  10/02/2021   IR FLUORO GUIDE CV LINE LEFT  01/01/2018   IR FLUORO GUIDE CV LINE LEFT  03/24/2018   IR FLUORO GUIDE CV LINE LEFT  05/21/2018   IR FLUORO GUIDE CV LINE LEFT  07/09/2018   IR FLUORO GUIDE CV LINE LEFT  12/28/2020   IR FLUORO GUIDE CV LINE LEFT  07/02/2021   IR FLUORO GUIDE CV LINE LEFT  01/14/2022   IR FLUORO GUIDE CV LINE RIGHT  11/01/2020   IR GENERIC HISTORICAL  07/04/2016   IR REMOVAL TUN CV CATH W/O FL 07/04/2016 Brayton El, PA-C [facility]   IR GENERIC  HISTORICAL  07/10/2016   IR US GUIDE VASC ACCESS LEFT 07/10/2016 Oley Balm, MD [facility]   IR GENERIC HISTORICAL  07/10/2016   IR FLUORO GUIDE CV LINE LEFT 07/10/2016 Oley Balm, MD [facility]   IR PATIENT EVAL TECH 0-60 MINS  10/07/2017   IR PATIENT EVAL TECH 0-60 MINS  03/09/2018  IR RADIOLOGIST EVAL & MGMT  07/21/2017   IR RADIOLOGIST EVAL & MGMT  11/14/2020   IR RADIOLOGIST EVAL & MGMT  11/27/2020   IR RADIOLOGIST EVAL & MGMT  01/24/2021   IR REMOVAL TUN CV CATH W/O FL  12/28/2020   IR TRANSCATH PLC STENT  INITIAL VEIN  INC ANGIOPLASTY  12/28/2020   IR US GUIDE VASC ACCESS LEFT  11/01/2020   IR US GUIDE VASC ACCESS LEFT  12/28/2020   IR US GUIDE VASC ACCESS LEFT  12/28/2020   IR US GUIDE VASC ACCESS LEFT  12/28/2020   IR US GUIDE VASC ACCESS RIGHT  11/01/2020   IR VENO/EXT/UNI LEFT  11/01/2020   IR VENOCAVAGRAM SVC  12/28/2020   LEFT HEART CATH AND CORONARY ANGIOGRAPHY N/A 06/26/2020   Procedure: LEFT HEART CATH AND CORONARY ANGIOGRAPHY;  Surgeon: Jolaine Artist, MD;  Location: [place] CV LAB;  Service: Cardiovascular;  Laterality: N/A;   ORIF PROXIMAL FEMORAL FRACTURE W/ ITST NAIL SYSTEM  03/2007   left, [name]. Shellia Carwin   RIGHT HEART CATH N/A 01/01/2022   Procedure: RIGHT HEART CATH;  Surgeon: Jolaine Artist, MD;  Location: [place] [facility];  Service: Cardiovascular;  Laterality: N/A;   SMALL INTESTINE SURGERY  2005   multiple with right colon resection for ischemia/infarct   TEE WITHOUT CARDIOVERSION N/A 01/14/2022   Procedure: TRANSESOPHAGEAL ECHOCARDIOGRAM (TEE);  Surgeon: [name], MD;  Location: [place];  Service: Cardiology;  Laterality: N/A;    Prior to Admission medications   Medication Sig Start Date End Date Taking? Authorizing Provider  acetaminophen (TYLENOL) 500 MG tablet Take 1,000 mg by mouth every 6 (six) hours as needed for mild pain.    [provider]  ADULT TPN Inject 1,800 mLs into the vein 4 (four) times a week. Pt receives home  TPN from [facility]:  1800 mL bag, four nights weekly (Monday, Tuesday, Wednesday, Thursday for 8 hours (includes 1 hour taper up and down).    [provider]  Calcium Carb-Cholecalciferol (CALCIUM + D3 PO) Take 1 tablet by mouth in the morning and at bedtime. Morning & early evening.    [provider]  cetirizine (ZYRTEC) 10 MG tablet Take 10 mg by mouth daily as needed for allergies.    [provider]  clobetasol ointment (TEMOVATE) 1.69 % Apply 1 application  topically 2 (two) times daily as needed (irritation). 06/14/21   [provider]  diazepam (VALIUM) 2 MG tablet Take 1 tablet (2 mg total) by mouth every 6 (six) hours as needed for anxiety. Take one tablet one hour prior to scan for claustrophobia. Patient not taking: Reported on 01/15/2022 01/01/22   Gardenia Phlegm, NP  diphenhydrAMINE (BENADRYL) 25 mg capsule Take 25 mg by mouth every 6 (six) hours as needed for itching or sleep.    [provider]  empagliflozin (JARDIANCE) 10 MG TABS tablet Take 1 tablet (10 mg total) by mouth daily before breakfast. 06/11/21   Bensimhon, Shaune Pascal, MD  ergocalciferol (DRISDOL) 200 MCG/ML drops Take 6 mLs (48,000 Units total) by mouth 3 (three) times a week. 08/21/21   Gatha Mayer, MD  fluconazole (DIFLUCAN) 150 MG tablet Take 1 tablet today.  Take second tablet 3 days later. 01/04/22   Lynden Oxford Scales, PA-C  furosemide (LASIX) 40 MG tablet Take 2 tablets (80 mg total) by mouth daily. Patient taking differently: Take 40 mg by mouth 2 (two) times daily. Morning & early afternoon. 12/16/21  Bensimhon, Shaune Pascal, MD  Heparin Sodium, Porcine, (HEPARIN LOCK FLUSH IJ) Inject 5 mLs as directed 4 (four) times a week. Monday, Tuesday, Wednesday, Thursday in the morning with TPN    [provider]  losartan (COZAAR) 25 MG tablet Take 1 tablet (25 mg total) by mouth at bedtime. Patient taking differently: Take 12.5 mg by mouth at bedtime. 12/16/21    Bensimhon, Shaune Pascal, MD  omeprazole (PRILOSEC) 40 MG capsule TAKE 1 CAPSULE BY MOUTH EVERY DAY BEFORE BREAKFAST 12/27/21   Gatha Mayer, MD  ondansetron (ZOFRAN-ODT) 8 MG disintegrating tablet TAKE 1 TABLET BY MOUTH EVERY 8 HOURS AS NEEDED FOR NAUSEA OR VOMITING. 09/09/21   Gatha Mayer, MD  Pediatric Multivit-Minerals (FLINTSTONES GUMMIES PO) Take 4 tablets by mouth in the morning.    [provider]  potassium chloride SA (KLOR-CON M20) 20 MEQ tablet Take 1 tablet (20 mEq total) by mouth daily. 12/16/21   Bensimhon, Shaune Pascal, MD  sodium chloride 0.9 % infusion Inject 900 mLs into the vein every 14 (fourteen) days. Uses for TPN 10/27/17   [provider]  spironolactone (ALDACTONE) 25 MG tablet TAKE 1 TABLET (25 MG TOTAL) BY MOUTH DAILY. Patient taking differently: Take 25 mg by mouth in the morning. 08/28/21   Bensimhon, Shaune Pascal, MD  Teduglutide, rDNA, 5 MG KIT Inject 3.31 Units into the skin every morning. 10/17/21   Gatha Mayer, MD  triamcinolone cream (KENALOG) 0.1 % Apply 1 application topically as needed (itching).    [provider]  vitamin E 1000 UNIT capsule Take 1,000 Units by mouth in the morning.    [provider]  XARELTO 20 MG TABS tablet TAKE 1 TABLET BY MOUTH DAILY WITH SUPPER. 08/30/21   [name] Minium, MD    Scheduled Meds:  Chlorhexidine Gluconate Cloth  6 each Topical Daily   furosemide  40 mg Oral BID   losartan  12.5 mg Oral QHS   pantoprazole  40 mg Oral Daily   rivaroxaban  20 mg Oral Q supper   spironolactone  25 mg Oral q AM   Infusions:  ceFEPime (MAXIPIME) IV 2 g (01/22/22 0059)   PRN Meds: acetaminophen **OR** acetaminophen, mouth rinse, sodium chloride flush   Allergies as of 01/21/2022 - Review Complete 01/21/2022  Allergen Reaction Noted   Ivp dye [iodinated contrast media] Itching 12/09/2021    Family History  Problem Relation Age of Onset   Diabetes Mother    Hypertension Mother    AAA (abdominal  aortic aneurysm) Mother    Colon cancer Neg Hx    Stomach cancer Neg Hx     Social History   Socioeconomic History   Marital status: Married    Spouse name: Not on file   Number of children: 1   Years of education: Not on file   Highest education level: Not on file  Occupational History   Not on file  Tobacco Use   Smoking status: Never   Smokeless tobacco: Never  Vaping Use   Vaping Use: Never used  Substance and Sexual Activity   Alcohol use: Never   Drug use: Never   Sexual activity: Yes    Comment: 1st intercourse 58 yo-5 partners  Other Topics Concern   Not on file  Social History Narrative   ** Merged History Encounter **       Married to [place], has 1 daughter and a granddaughter the patient's son died in childhood due to a motor vehicle  wreck 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 No alcohol tobacco or drug use 04/07/2017    Social Determinants of Health   Financial Resource Strain: Medium Risk (12/30/2017)   Overall Financial Resource Strain (CARDIA)    Difficulty of Paying Living Expenses: Somewhat hard  Food Insecurity: No Food Insecurity (01/22/2022)   Hunger Vital Sign    Worried About Running Out of Food in the Last Year: Never true    Ran Out of Food in the Last Year: Never true  Transportation Needs: No Transportation Needs (01/22/2022)   PRAPARE - Hydrologist (Medical): No    Lack of Transportation (Non-Medical): No  Physical Activity: Insufficiently Active (12/30/2017)   Exercise Vital Sign    Days of Exercise per Week: 7 days    Minutes of Exercise per Session: 20 min  Stress: No Stress Concern Present (12/30/2017)   [place]    Feeling of Stress : Not at all  Social Connections: Moderately Integrated (12/30/2017)   Social Connection and Isolation Panel [NHANES]    Frequency of Communication with Friends and  Family: More than three times a week    Frequency of Social Gatherings with Friends and Family: Three times a week    Attends Religious Services: 1 to 4 times per year    Active Member of Clubs or Organizations: No    Attends Archivist Meetings: Never    Marital Status: Married  Human resources officer Violence: Not on file    REVIEW OF SYSTEMS: Constitutional: On Monday morning patient was out walking with her husband with no noticeable weakness.  No increased fatigue. ENT:  No nose bleeds Pulm: No cough.  No shortness of breath. CV:  No palpitations, no LE edema.  No angina GU:  No hematuria, no frequency GI: At baseline has watery stools, these are unchanged. Heme: No unusual or excessive bleeding or bruising Transfusions: Received PRBCs in 2013 and February 2023. Neuro:  No headaches, no peripheral tingling or numbness Derm:  No itching, no rash or sores.  Endocrine:  No sweats or chills.  No polyuria or dysuria Immunization: Reviewed.  Up-to-date on multiple vaccines. Travel:  None beyond local counties in last few months.    PHYSICAL EXAM: Vital signs in last 24 hours: Vitals:   01/22/22 0200 01/22/22 0422  BP: (!) 116/48 115/65  Pulse: 65 62  Resp:  15  Temp: 98 F (36.7 C) 98 F (36.7 C)  SpO2: 99% 97%   Wt Readings from Last 3 Encounters:  01/22/22 65.3 kg  01/16/22 61.9 kg  01/15/22 62.1 kg    General: Looks chronically ill.  Periodically moaning. Head: No signs of head trauma.  No facial asymmetry. Eyes: No conjunctival pallor or scleral icterus Ears: No obvious hearing deficit Nose: No congestion or discharge Mouth: No oropharyngeal lesions.  Tongue midline.  Edentulous. Neck: No JVD, masses, thyromegaly Lungs: Diminished breath sounds due to poor inspiratory effort but no adventitious sounds.  No labored breathing.  No cough.  The access point for her tunnel catheter positioned at upper left chest. Heart: RRR with very soft murmer.   Abdomen:  Nondistended, thin.  Mild tenderness in left lower quadrant without guarding or rebound.  Bowel sounds normal quality but hypoactive..   Rectal: Deferred Musc/Skeltl: No obvious joint redness, swelling.  Arthritic changes in the fingers. Extremities: No CCE.  Feet are warm. Neurologic: Patient not oriented  to place ("I don't know"), family members.  Inconsistently following simple commands.  Moves all 4 limbs, strength not tested.  No tremors. Skin: No rash, no sores Nodes: No cervical adenopathy Psych: Moaning at times otherwise a bit lethargic.  Intake/Output from previous day: 09/12 0701 - 09/13 0700 In: 100 [IV Piggyback:100] Out: 120 [Urine:120] Intake/Output this shift: No intake/output data recorded.  LAB RESULTS: Recent Labs    01/21/22 1354 01/22/22 0143  WBC 5.6 7.4  HGB 11.2* 11.7*  HCT 34.7* 35.6*  PLT 93* 115*   BMET Lab Results  Component Value Date   NA 135 01/22/2022   NA 134 (L) 01/21/2022   NA 134 (L) 01/21/2022   K 3.9 01/22/2022   K 4.2 01/21/2022   K 4.5 01/21/2022   CL 103 01/22/2022   CL 103 01/21/2022   CL 102 01/21/2022   CO2 23 01/22/2022   CO2 22 01/21/2022   CO2 25 01/21/2022   GLUCOSE 96 01/22/2022   GLUCOSE 76 01/21/2022   GLUCOSE 76 01/21/2022   BUN 22 01/22/2022   BUN 26 (H) 01/21/2022   BUN 27 (H) 01/21/2022   CREATININE 1.26 (H) 01/22/2022   CREATININE 1.20 (H) 01/21/2022   CREATININE 1.18 01/21/2022   CALCIUM 8.9 01/22/2022   CALCIUM 8.5 (L) 01/21/2022   CALCIUM 9.1 01/21/2022   LFT Recent Labs    01/21/22 1216 01/21/22 1415 01/22/22 0143  PROT 8.5* 8.1 8.5*  ALBUMIN 3.1* 2.6* 2.7*  AST 24 32 28  ALT $Re'16 19 20  'KAC$ ALKPHOS 145* 141* 145*  BILITOT 0.4 0.6 0.7   PT/INR Lab Results  Component Value Date   INR 1.5 (H) 01/21/2022   INR 1.6 (H) 12/09/2021   INR 4.6 (HH) 06/29/2021   PROTIME 13.2 05/27/2012   PROTIME 14.4 (H) 05/13/2012   PROTIME 14.4 (H) 04/29/2012   Hepatitis Panel No results for input(s):  "HEPBSAG", "HCVAB", "HEPAIGM", "HEPBIGM" in the last 72 hours. C-Diff No components found for: "CDIFF" Lipase  No results found for: "LIPASE"  Drugs of Abuse  No results found for: "LABOPIA", "COCAINSCRNUR", "LABBENZ", "AMPHETMU", "THCU", "LABBARB"   RADIOLOGY STUDIES: CT Angio Chest/Abd/Pel for Dissection W and/or Wo Contrast  Result Date: 01/21/2022 CLINICAL DATA:  Chest and back pain. EXAM: CT ANGIOGRAPHY CHEST, ABDOMEN AND PELVIS TECHNIQUE: Non-contrast CT of the chest was initially obtained. Multidetector CT imaging through the chest, abdomen and pelvis was performed using the standard protocol during bolus administration of intravenous contrast. Multiplanar reconstructed images and MIPs were obtained and reviewed to evaluate the vascular anatomy. RADIATION DOSE REDUCTION: This exam was performed according to the departmental dose-optimization program which includes automated exposure control, adjustment of the mA and/or kV according to patient size and/or use of iterative reconstruction technique. CONTRAST:  175mL OMNIPAQUE IOHEXOL 350 MG/ML SOLN COMPARISON:  CT PE protocol 12/09/2021 FINDINGS: CTA CHEST FINDINGS Cardiovascular: No evidence for aortic dissection or aneurysm. There are mild calcified atherosclerotic plaque throughout the aorta. There is no large central pulmonary embolism. The heart is moderately enlarged. There is no pericardial effusion. Left-sided central venous catheter ends in the right atrium. Mediastinum/Nodes: No enlarged mediastinal, hilar, or axillary lymph nodes. Thyroid gland, trachea, and esophagus demonstrate no significant findings. Lungs/Pleura: There is a ground-glass nodular density in the right upper lobe measuring 7 mm image 11/27. There is minimal atelectasis in the lung bases. The lungs are otherwise clear. No pleural effusion or pneumothorax. Musculoskeletal: No chest wall abnormality. No acute or significant osseous findings. Review of the MIP  images confirms  the above findings. CTA ABDOMEN AND PELVIS FINDINGS VASCULAR Aorta: Normal caliber aorta without aneurysm, dissection, vasculitis or significant stenosis. There are atherosclerotic calcifications throughout the aorta. Celiac: Patent without evidence of aneurysm, dissection, vasculitis or significant stenosis. SMA: Patent without evidence of aneurysm, dissection, vasculitis or significant stenosis. Renals: Limited evaluation secondary to artifact from patient's arms and respiratory motion artifact. Grossly patent bilaterally. IMA: Not well seen. Inflow: Patent without evidence of aneurysm, dissection, vasculitis or significant stenosis. Veins: No obvious venous abnormality within the limitations of this arterial phase study. Review of the MIP images confirms the above findings. NON-VASCULAR Hepatobiliary: No focal liver abnormality is seen. Status post cholecystectomy. No biliary dilatation. Pancreas: Unremarkable. No pancreatic ductal dilatation or surrounding inflammatory changes. Spleen: Mildly enlarged. Adrenals/Urinary Tract: Rounded hypodensity in the left kidney is too small to characterize, likely a cyst. Otherwise, the kidneys, adrenal glands and bladder are within normal limits. Stomach/Bowel: The colon is fluid filled. The cecum is midline and mildly dilated. Questionable stricture seen just proximal to the terminal ileum. No wall thickening or surrounding inflammation identified. The appendix is not seen. There is sigmoid colon diverticulosis. Small bowel and stomach are nondilated. No free air. Lymphatic: No enlarged lymph nodes. Reproductive: There is a 4.8 x 4.5 cm left adnexal cystic area. Right adnexa and uterus appear within normal limits. Other: No ascites or focal abdominal wall hernia. Musculoskeletal: No acute or significant osseous findings. Review of the MIP images confirms the above findings. IMPRESSION: 1. No evidence for aortic dissection or aneurysm. 2. Moderate cardiomegaly. 3. No acute  cardiopulmonary process. 4. The cecum is dilated and fluid-filled and there is questionable stricture or mass within the colon just proximal to the terminal ileum. Recommend clinical correlation and follow-up. 5. Mild splenomegaly. 6. Subcentimeter left Bosniak 2 lesion, too small to characterize. No follow-up imaging is recommended. JACR 2018 Feb; 264-273, Management of the Incidental RenalMass on CT, [employer] 2021; 814-848, Bosniak Classification of Cystic Renal Masses, Version 2019. 7. 7 mm right ground-glass pulmonary nodule within the upper lobe. Per Fleischner Society Guidelines, recommend a non-contrast Chest CT at 6-12 months to confirm persistence, then additional non-contrast Chest CTs every 2 years until 5 years. If nodule grows or develops solid component(s), consider resection. These guidelines do not apply to immunocompromised patients and patients with cancer. Follow up in patients with significant comorbidities as clinically warranted. For lung cancer screening, adhere to Lung-RADS guidelines. Reference: Radiology. 2017; 284(1):228-43. 8. 4.8 cm left adnexal simple-appearing cyst. Recommend follow-up pelvic US in 6-12 months. Reference: JACR 2020 Feb;17(2):248-254 Electronically Signed   By: Ronney Asters M.D.   On: 01/21/2022 17:26   DG Chest Port 1 View  Result Date: 01/21/2022 CLINICAL DATA:  Mental status changes. Possible sepsis. Recent exchange tunneled central venous catheter. EXAM: PORTABLE CHEST 1 VIEW COMPARISON:  12/09/2021 FINDINGS: Stable cardiac enlargement. Left jugular tunneled central line present with the catheter tip at the SVC/RA junction. There is no evidence of pulmonary edema, consolidation, pneumothorax, nodule or pleural fluid. IMPRESSION: Stable cardiac enlargement and positioning of tunneled left jugular central venous catheter. Electronically Signed   By: Aletta Edouard M.D.   On: 01/21/2022 14:16   CT Head Wo Contrast  Result Date: 01/21/2022 CLINICAL DATA:   Mental status changes of unknown cause, confusion EXAM: CT HEAD WITHOUT CONTRAST TECHNIQUE: Contiguous axial images were obtained from the base of the skull through the vertex without intravenous contrast. RADIATION DOSE REDUCTION: This exam was performed according to the departmental  dose-optimization program which includes automated exposure control, adjustment of the mA and/or kV according to patient size and/or use of iterative reconstruction technique. COMPARISON:  CT head 07/23/2020 FINDINGS: Brain: Generalized atrophy. Normal ventricular morphology. No midline shift or mass effect. Small vessel chronic ischemic changes of deep cerebral white matter. No intracranial hemorrhage, mass lesion, evidence of acute infarction, or extra-axial fluid collection. Vascular: No hyperdense vessels. Atherosclerotic calcification of internal carotid and vertebral arteries at skull base Skull: Demineralized but intact Sinuses/Orbits: Clear Other: N/A IMPRESSION: Atrophy with small vessel chronic ischemic changes of deep cerebral white matter. No acute intracranial abnormalities. Electronically Signed   By: Lavonia Dana M.D.   On: 01/21/2022 13:53      IMPRESSION:     AMS.  [name] Linus Salmons feels this could be from Cefepime, so stopping this.  CT head without acute findings, shows chronic ischemic changes and atrophy.  Enterobacter bacteremia attributed to her central line.  Underwent central line replacement on 01/14/2022.  A week of cefepime started on Friday.  Followed by [name]. Linus Salmons.  CT raising question of stricture proximal to terminal ileum.  Protein calorie malnutrition of unknown determined severity.  Prealbumin last week was 16.  Elevated alkaline phosphatase at 145.  Previous cholecystectomy.  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  Also on Gatex   Eats well  Chronic thrombocytopenia and splenomegaly.  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  On Xarelto.  This is not on hold    MV regurgitation.  [name]. Burt Knack follows with plans for Mitraclip .      Hx IDA.  Current Hgb 11.7.    Colon polyps.  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  [name] [name] had planned colonoscopy in 6 to 12 months.    Gastritis on EGD May 2023.  Biopsies negative for H. pylori.  Home meds include omeprazole 40 mg daily    Hx multiple mineral, vitamin deficiencies.    PLAN:       Per [name] Bryan Lemma  Given her altered mental status increases her risk for aspiration, agree with n.p.o. x for meds.    Azucena Freed  01/22/2022, 8:12 AM Phone [phone]

## 2022-01-22 NOTE — Progress Notes (Signed)
EEG complete - results pending 

## 2022-01-22 NOTE — Procedures (Addendum)
Patient Name: Faithlyn C Delany  MRN: [id number]  Epilepsy Attending: Lora Havens  Referring Physician/Provider: Janine Ores, NP  Date: 01/22/2022 Duration: 28.49 mins  Patient history: 71yo F with ams. EEG to evaluate for seizure  Level of alertness: Awake  AEDs during EEG study: Ativan  Technical aspects: This EEG study was done with scalp electrodes positioned according to the 10-20 International system of electrode placement. Electrical activity was reviewed with band pass filter of 1-'70Hz'$ , sensitivity of 7 uV/mm, display speed of 32m/sec with a '60Hz'$  notched filter applied as appropriate. EEG data were recorded continuously and digitally stored.  Video monitoring was available and reviewed as appropriate.  Description: EEG showed continuous generalized 3-'5hz'$  theta-delta slowing.  Generalized periodic discharges with triphasic morphology were noted at 1-2.5 Hz.  Hyperventilation and photic stimulation were not performed.      ABNORMALITY - Periodic discharges with triphasic morphology, generalized ( GPDs) - Continuous slow, generalized   IMPRESSION: This study showed generalized periodic discharges with triphasic morphology. This pattern can be seen due to toxic-metabolic causes like cefepime toxicity.  However, due to the frequency,would recommend LTM monitoring if clinically indicated.     Additionally there is moderate to severe diffuse encephalopathy. No definite seizures were seen during the study.    Deseray Daponte OBarbra Sarks

## 2022-01-22 NOTE — Progress Notes (Signed)
Date and time results received: 01/22/22 0250 (use smartphrase ".now" to insert current time)  Test: PO2 ven  Critical Value: <31  Name of Provider Notified: Olena Heckle, NP at 0256  Orders Received? Or Actions Taken?:  Olena Heckle, NP aware, No new orders at this time

## 2022-01-22 NOTE — Progress Notes (Signed)
PHARMACY - TOTAL PARENTERAL NUTRITION CONSULT NOTE   Indication: Short bowel syndrome  Patient Measurements: Height: '5\' 8"'$  (172.7 cm) Weight: 65.3 kg (143 lb 15.4 oz) IBW/kg (Calculated) : 63.9 TPN AdjBW (KG): 61.9 Body mass index is 21.89 kg/m. Usual Weight: 65kg  Assessment: 56 YOF presenting for acute metabolic encephalopathy after completing a 7 day course of cefepime for Enterobacter bacteremia. 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 At home, she uses TPN Monday-Friday (5x weekly) for 10 hrs each day (8 hr + 1 hr taper up/down). Home TPN prescription supplied by [facility] and printed document added to shadow chart (Screenshot of prescription shown below). Pharmacy consulted to initiate TPN for continuation from home while patient is admitted.  Glucose / Insulin: No hx DM; CBG <180 so far this admission Electrolytes: K 3.9, CoCa 9.9, Phos 3, Mg 2.1 Renal: Scr 1.26 (Baseline 1-1.2), BUN 22 Hepatic: Albumin 2.7, tbili 0.7, LFTs WNL, TG 224 Intake / Output; MIVF: UOP not well documented, no MVIF, LBM PTA GI Imaging: GI Surgeries / Procedures:   Central access: Tunneled CVC (last exchange 9/5) TPN start date: Prior to admission  Home TPN Prescription [name]):     RD Assessment:    Current Nutrition:  NPO and TPN  Plan:  Start cycle goal TPN over 14 hr (rate 77-154 ml/hr, GIR 2.59-5.9 mg/kg/min) provides 80 g AA and 1940 kcal, meeting 100% of needs.   Electrolytes in TPN: Na 130 mEq/L, K 30 mEq/L, Ca 4 mEq/L, Mg 7 mEq/L, and Phos 7 mmol/L. Cl:Ac 1:2 Add standard MVI and trace elements to TPN Remove chromium as not part of home TPN prescription Initiate Sensitive q6h SSI and adjust as needed  Monitor TPN labs on Mon/Thurs, and as needed  Merrilee Jansky, PharmD Clinical Pharmacist 01/22/2022,12:02 PM

## 2022-01-22 NOTE — Progress Notes (Addendum)
Progress Note  Patient: [name] [name] ZOX:[id number] DOB: Oct 07, 1950  DOA: 01/21/2022  DOS: 01/22/2022    Brief hospital course: Avanna C Eick is a 71 y.o. 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   She has a history of recurrent bacteremia, first noted in November 2015, then September 2016 (Serratia), Feb 2018, July 2020, June 2022 (Klebsiella), February 2023 (S. pyogenes).   She developed chills and some disorientation on 9/3 while infusing TPN reminiscent of prior bacteremias. She pursued a previously scheduled TEE on 9/5 as work up for MitraClip for severe MR and had her tunneled central line exchanged at that time as well by IR, Dr. Anselm Pancoast. Expedited follow up in ID clinic 9/6, had blood cultures drawn, empirically given IM ceftriaxone, had plans to start ancef 9/7 though cultures grew GNR, so cefepime was prescribed instead, after the patient declined to present to the ED per ID recommendation. In her usual state of health, essentially independent other than that she doesn't drive, until the evening of 9/11 when her husband reported encephalopathy that worsened prompting ED evaluation.  Assessment and Plan: Acute toxic/metabolic encephalopathy: Abrupt onset, nonfocal exam. Suspect precipitating cause is cefepime toxicity in setting of renal dysfunction and baseline abnormal brain neuroimaging/chronic stroke/encephalomalacia. Known bacteremia is possible precipitant but abrupt worsening confusion began after 4-5 days of effective abx. - Abrupt mental status changes occurred ~3-4 days after initiation of cefepime. Though this was dosed for impaired renal function, renal impairment is a significant risk factor for  toxicity. This is current leading suspicion for etiology and will be held. Last dose was 9/13 ~1am. Would anticipate improvement in next 48 hours if this is the cause. - In absence of severe HTN, PRES felt less likely. Though, note from Dr. Mickeal Skinner 01/16/2022 listed this as possible etiology of her tinnitus-type symptoms after MRI 8/14 showed new developing bilateral occipital pole/PCA territory encephalomalacia. MRA (on 9/3) which revealed no infarcts, severe stenoses, or source of infarcts. Negative internal auditory imaging. CT not suggestive of PRES here, though MR would be most sensitive. Will control BP and ask neurology to weigh in.  - With suggestion of new GI mass ?paraneoplastic process though this is less likely given actually low suspicion for true mass/cancer (see discussion below).  - Metabolic causes including infection evaluated. Negative UA, no infiltrate or intraabdominal inflammation/fluid collection on CTA C/A/P, negative flu/covid PCR, PICC line does not appear infected at this time, and patient has been on antibiotics. Also no meningismus or infectious signs/symptoms. Neurology feels LP is not currently needed. - Check routine labs to r/o severe vitamin deficiency (would be unlikely despite short gut with chronic TPN use). Also check RPR. TSH and ammonia noted to be wnl.  - MRI again ordered without contrast today and not tolerated by patient.  - EEG ordered to evaluate for nonconvulsive seizure/status. Addendum: This has demonstrated triphasic GPDs consistent with cefepime toxicity. LTM EEG recommended and being pursued due to frequency that may be in ictal-interictal continuum.  - D/w neurology several times throughout the day, whose assistance is greatly appreciated - Due to agitation despite addressing multiple possible contributions, to maintain patient safety and minimize interference in delivery of medical care, I've ordered a safety sitter as well as low doses of  ativan prn  anxiety/agitation and haldol prn refractory agitation. Will monitor cardiac telemetry.   Enterobacter aerogenes CLABSI, recurrent bacteremia: s/p line exchange 9/5. Currently without fever or leukocytosis s/p cefepime 9/8 - 9/12. Also sensitive to FQs, bactrim, carbapenem.  - ID recommends monitoring off abx at this time.  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 Dilated cecum with no evidence of inflammation. No GI symptoms. - GI consult appreciated. High suspicion for peristalsis/postoperative change as well as motion degradation as cause of findings. Advise against endoscopic evaluation in lieu of outpatient repeat CT.  Short gut s/p resection:  - Continue TPN per home Rx.  - Continue home teduglutide GLP-2  Chronic combined HFrEF: Followed by Dr. Haroldine Laws, cardiac catheterization 8/23 showed elevated fillings pressures, mod pulmonary venous HTN, normal output. Currently euvolemic. TTE 12/16/2021 w/LVEF 35-40%, mod-severe LV dilation, G2DD, normal RV, severe LAE and MR, mild-mod AI, mild AS.  - Continue home medications as able. Her encephalopathy limits oral medication administration, so we're forced to hold spironolactone and ARB.  - Convert routine lasix to IV at half dose - Hold SGLT2i.  Nonrheumatic severe MR:  - Follow up with structural heart team after discharge for MitraClip. Note from 9/7 suggests suitable valve..   Sinus bradycardia:  - Continue monitoring.  Stage IIIa CKD: Current CrCl is ~81m/min. - Renally dose medications, avoid nephrotoxins.   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   History of multiple DVTs, thrombophilia:  - Maintain on therapeutic anticoagulation. Since unable to reliably administer DOAC orally, will initiate heparin gtt. This would also be beneficial in  the event that GI procedure or LP is required.   GERD:  - Change PPI to IV.  AOCD:  - Monitor CBC  - Continue routine follow up with Dr. GLindi Adiefor aranesp. Did not qualify for dose 9/7 w/hgb 10.9g/dl.  Hypoalbuminemia: Noted. No edema.  Demand myocardial ischemia: Very mild troponin elevation 33 > 32 (improved significantly from previous) without ischemic ECG changes or reports of anginal symptoms.   Thrombocytopenia: Chronic, stable, likely due to splenomegaly which is chronic and mild by CTA this admission. - Monitor for bleeding   SPN: 7 mm right ground-glass pulmonary nodule within the upper lobe. - Guidelines recommend non-contrast chest CT at 6-12 months. If stable, repeat CT every 2 years until 5 years. If nodule grows or develops solid component(s), consider resection.  Subjective: Pt persistently confused. Not progressively so, stable from yesterday. She will moan indiscriminantly but cannot provide any reliable history. Family at bedside helpful with history.   Objective: Vitals:   01/22/22 0101 01/22/22 0200 01/22/22 0422 01/22/22 1214  BP:  (!) 116/48 115/65   Pulse:  65 62   Resp:   15   Temp:  98 F (36.7 C) 98 F (36.7 C)   TempSrc:  Oral Oral   SpO2:  99% 97%   Weight: 65.3 kg   65.8 kg  Height:       Gen: Chronically ill-appearing 71y.o. female in no distress Pulm: Nonlabored breathing room air. Clear CV: Regular borderline bradycardia with soft apical systolic murmur, no rub, or gallop. No JVD, no dependent edema. GI: Abdomen soft, tender diffusely though several nonanatomically related sites throughout exam without visible or palpable deformities are also elicit moaning out of proportion to exam. Non-distended, with normoactive bowel sounds.  Ext: Warm, no deformities  Skin: Left upper chest tunneled line without tenderness, erythema, discharge or bleeding. No other rashes, lesions or ulcers on visualized skin. No jaundice or pallor Neuro: Alert, doesn't  follow most commands, answers nearly no questions. MAE Psych: Anxious appearing, agitated and difficult to redirect at times.   Data Personally reviewed: CBC: Recent Labs  Lab 01/16/22 1454 01/21/22 1354 01/22/22 0143  WBC 5.8 5.6 7.4  NEUTROABS 3.2  --  3.4  HGB 10.9* 11.2* 11.7*  HCT 33.3* 34.7* 35.6*  MCV 91.7 92.3 91.3  PLT 120* 93* 734*   Basic Metabolic Panel: Recent Labs  Lab 01/16/22 1454 01/21/22 1216 01/21/22 1415 01/22/22 0143  NA 134* 134* 134* 135  K 3.5 4.5 4.2 3.9  CL 112* 102 103 103  CO2 14* '25 22 23  '$ GLUCOSE 84 76 76 96  BUN 20 27* 26* 22  CREATININE 1.60* 1.18 1.20* 1.26*  CALCIUM 8.5* 9.1 8.5* 8.9  MG 1.7 2.1  --  2.1  PHOS 4.2 1.9*  --  3.0   GFR: Estimated Creatinine Clearance: 41.3 mL/min (A) (by C-G formula based on SCr of 1.26 mg/dL (H)). Liver Function Tests: Recent Labs  Lab 01/16/22 1454 01/21/22 1216 01/21/22 1415 01/22/22 0143  AST 16 24 32 28  ALT '13 16 19 20  '$ ALKPHOS 170* 145* 141* 145*  BILITOT 0.4 0.4 0.6 0.7  PROT 8.6* 8.5* 8.1 8.5*  ALBUMIN 3.3* 3.1* 2.6* 2.7*   No results for input(s): "LIPASE", "AMYLASE" in the last 168 hours. Recent Labs  Lab 01/21/22 1605  AMMONIA 15   Coagulation Profile: Recent Labs  Lab 01/21/22 1407  INR 1.5*   Cardiac Enzymes: No results for input(s): "CKTOTAL", "CKMB", "CKMBINDEX", "TROPONINI" in the last 168 hours. BNP (last 3 results) No results for input(s): "PROBNP" in the last 8760 hours. HbA1C: No results for input(s): "HGBA1C" in the last 72 hours. CBG: Recent Labs  Lab 01/21/22 1303 01/22/22 1930  GLUCAP 74 120*   Lipid Profile: Recent Labs    01/22/22 0143  TRIG 224*   Thyroid Function Tests: Recent Labs    01/22/22 0143  TSH 3.620   Anemia Panel: No results for input(s): "VITAMINB12", "FOLATE", "FERRITIN", "TIBC", "IRON", "RETICCTPCT" in the last 72 hours. Urine analysis:    Component Value Date/Time   COLORURINE YELLOW 01/21/2022 1556   APPEARANCEUR  CLEAR 01/21/2022 1556   LABSPEC 1.010 01/21/2022 1556   PHURINE 6.0 01/21/2022 1556   GLUCOSEU NEGATIVE 01/21/2022 1556   HGBUR TRACE (A) 01/21/2022 1556   HGBUR trace-intact 04/12/2010 0958   BILIRUBINUR NEGATIVE 01/21/2022 1556   BILIRUBINUR negative 01/04/2022 1339   BILIRUBINUR neg 04/08/2011 0947   KETONESUR NEGATIVE 01/21/2022 1556   PROTEINUR 30 (A) 01/21/2022 1556   UROBILINOGEN 0.2 01/04/2022 1339   UROBILINOGEN 0.2 09/17/2012 1122   NITRITE NEGATIVE 01/21/2022 1556   LEUKOCYTESUR NEGATIVE 01/21/2022 1556   Recent Results (from the past 240 hour(s))  Blood culture (routine single)     Status: Abnormal   Collection Time: 01/15/22 10:12 AM   Specimen: Blood  Result Value Ref Range Status   MICRO NUMBER: [id number]  Final   SPECIMEN QUALITY: Adequate  Final   Source BLOOD 1  Final   STATUS: FINAL  Final   ISOLATE 1: Enterobacter aerogenes (AA)  Final    Comment: Klebsiella aerogenes (Enterobacter) from aerobic bottle only   COMMENT: Aerobic and anaerobic bottle received.  Final      Susceptibility   Enterobacter aerogenes - BLOOD CULTURE, NEGATIVE 1  AMOX/CLAVULANIC >=32 Resistant     CEFAZOLIN* >=64 Resistant      * For infections other than uncomplicated UTI caused by E. coli, K. pneumoniae or P. mirabilis: Cefazolin is resistant if MIC > or = 8 mcg/mL. (Distinguishing susceptible versus intermediate for isolates with MIC < or = 4 mcg/mL requires additional testing.)     CEFTAZIDIME >=64 Resistant     CEFEPIME <=1 Sensitive     CEFTRIAXONE 16 Resistant     CIPROFLOXACIN <=0.25 Sensitive     LEVOFLOXACIN <=0.12 Sensitive     GENTAMICIN <=1 Sensitive     IMIPENEM 1 Sensitive     PIP/TAZO >=128 Resistant     TOBRAMYCIN <=1 Sensitive     TRIMETH/SULFA* <=20 Sensitive      * For infections other than uncomplicated UTI caused by E. coli, K. pneumoniae or P. mirabilis: Cefazolin is resistant if MIC > or = 8 mcg/mL. (Distinguishing susceptible versus  intermediate for isolates with MIC < or = 4 mcg/mL requires additional testing.) Legend: S = Susceptible  I = Intermediate R = Resistant  NS = Not susceptible * = Not tested  NR = Not reported **NN = See antimicrobic comments   Blood culture (routine single)     Status: Abnormal   Collection Time: 01/15/22 10:12 AM   Specimen: Blood  Result Value Ref Range Status   MICRO NUMBER: [id number]  Final   SPECIMEN QUALITY: Adequate  Final   Source BLOOD 2  Final   STATUS: FINAL  Final   ISOLATE 1: Enterobacter aerogenes (AA)  Final    Comment: Klebsiella aerogenes (Enterobacter) from aerobic bottle only   COMMENT: Aerobic and anaerobic bottle received.  Final      Susceptibility   Enterobacter aerogenes - BLOOD CULTURE, NEGATIVE 1    AMOX/CLAVULANIC >=32 Resistant     CEFAZOLIN* >=64 Resistant      * For infections other than uncomplicated UTI caused by E. coli, K. pneumoniae or P. mirabilis: Cefazolin is resistant if MIC > or = 8 mcg/mL. (Distinguishing susceptible versus intermediate for isolates with MIC < or = 4 mcg/mL requires additional testing.)     CEFTAZIDIME >=64 Resistant     CEFEPIME <=1 Sensitive     CEFTRIAXONE 16 Resistant     CIPROFLOXACIN <=0.25 Sensitive     LEVOFLOXACIN <=0.12 Sensitive     GENTAMICIN <=1 Sensitive     IMIPENEM 1 Sensitive     PIP/TAZO >=128 Resistant     TOBRAMYCIN <=1 Sensitive     TRIMETH/SULFA* <=20 Sensitive      * For infections other than uncomplicated UTI caused by E. coli, K. pneumoniae or P. mirabilis: Cefazolin is resistant if MIC > or = 8 mcg/mL. (Distinguishing susceptible versus intermediate for isolates with MIC < or = 4 mcg/mL requires additional testing.) Legend: S = Susceptible  I = Intermediate R = Resistant  NS = Not susceptible * = Not tested  NR = Not reported **NN = See antimicrobic comments   Blood Culture (routine x 2)     Status: None (Preliminary result)   Collection Time: 01/21/22  2:07 PM   Specimen: BLOOD   Result Value Ref Range Status   Specimen Description BLOOD BLOOD RIGHT HAND  Final   Special Requests   Final    BOTTLES DRAWN AEROBIC AND ANAEROBIC Blood Culture results may not be optimal due to an inadequate volume of blood received in culture bottles   Culture   Final    NO  GROWTH < 24 HOURS Performed at [facility], [place] [address], [place], [place] [address]    Report Status PENDING  Incomplete  Resp Panel by RT-PCR (Flu A&B, Covid) Anterior Nasal Swab     Status: None   Collection Time: 01/21/22  2:15 PM   Specimen: Anterior Nasal Swab  Result Value Ref Range Status   SARS Coronavirus 2 by RT PCR NEGATIVE NEGATIVE Final    Comment: (NOTE) SARS-CoV-2 target nucleic acids are NOT DETECTED.  The SARS-CoV-2 RNA is generally detectable in upper respiratory specimens during the acute phase of infection. The lowest concentration of SARS-CoV-2 viral copies this assay can detect is 138 copies/mL. A negative result does not preclude SARS-Cov-2 infection and should not be used as the sole basis for treatment or other patient management decisions. A negative result may occur with  improper specimen collection/handling, submission of specimen other than nasopharyngeal swab, presence of viral mutation(s) within the areas targeted by this assay, and inadequate number of viral copies(<138 copies/mL). A negative result must be combined with clinical observations, patient history, and epidemiological information. The expected result is Negative.  Fact Sheet for Patients:  EntrepreneurPulse.com.au  Fact Sheet for Healthcare Providers:  IncredibleEmployment.be  This test is no t yet approved or cleared by the [place] FDA and  has been authorized for detection and/or diagnosis of SARS-CoV-2 by FDA under an Emergency Use Authorization (EUA). This EUA will remain  in effect (meaning this test can be used) for the duration of the COVID-19  declaration under Section 564(b)(1) of the Act, 21 U.S.C.section 360bbb-3(b)(1), unless the authorization is terminated  or revoked sooner.       Influenza A by PCR NEGATIVE NEGATIVE Final   Influenza B by PCR NEGATIVE NEGATIVE Final    Comment: (NOTE) The Xpert Xpress SARS-CoV-2/FLU/RSV plus assay is intended as an aid in the diagnosis of influenza from Nasopharyngeal swab specimens and should not be used as a sole basis for treatment. Nasal washings and aspirates are unacceptable for Xpert Xpress SARS-CoV-2/FLU/RSV testing.  Fact Sheet for Patients: EntrepreneurPulse.com.au  Fact Sheet for Healthcare Providers: IncredibleEmployment.be  This test is not yet approved or cleared by the [place] FDA and has been authorized for detection and/or diagnosis of SARS-CoV-2 by FDA under an Emergency Use Authorization (EUA). This EUA will remain in effect (meaning this test can be used) for the duration of the COVID-19 declaration under Section 564(b)(1) of the Act, 21 U.S.C. section 360bbb-3(b)(1), unless the authorization is terminated or revoked.  Performed at [facility], [name] [place], [place] [address]   Blood Culture (routine x 2)     Status: None (Preliminary result)   Collection Time: 01/21/22  5:38 PM   Specimen: BLOOD RIGHT ARM  Result Value Ref Range Status   Specimen Description   Final    BLOOD RIGHT ARM Performed at [facility], [address] [place], [place] [address]    Special Requests   Final    BOTTLES DRAWN AEROBIC AND ANAEROBIC Blood Culture adequate volume Performed at [facility], [address] [place], [place] [address]    Culture   Final    NO GROWTH < 12 HOURS Performed at [facility], [place] [address], [place], [place] [address]    Report Status PENDING  Incomplete     EEG adult  Result Date: 01/22/2022 Lora Havens, MD     01/22/2022  4:21  PM  Patient Name: Kaitlynd C Ringer MRN: [id number] Epilepsy Attending: Lora Havens Referring Physician/Provider: Janine Ores, NP Date: 01/22/2022 Duration: Patient history: 71yo F with ams. EEG to evaluate for seizure Level of alertness: Awake AEDs during EEG study: Ativan Technical aspects: This EEG study was done with scalp electrodes positioned according to the 10-20 International system of electrode placement. Electrical activity was reviewed with band pass filter of 1-'70Hz'$ , sensitivity of 7 uV/mm, display speed of 93m/sec with a '60Hz'$  notched filter applied as appropriate. EEG data were recorded continuously and digitally stored.  Video monitoring was available and reviewed as appropriate. Description: EEG showed continuous generalized 3-'5hz'$  theta-delta slowing.  Generalized periodic discharges with triphasic morphology were noted at 1-2.5 Hz.  Hyperventilation and photic stimulation were not performed.    ABNORMALITY - Periodic discharges with triphasic morphology, generalized ( GPDs) - Continuous slow, generalized  IMPRESSION: This study showed generalized periodic discharges with triphasic morphology. This pattern can be seen due to toxic-metabolic causes like cefepime toxicity.  However, due to the frequency,would recommend LTM monitoring if clinically indicated.   Additionally there is moderate to severe diffuse encephalopathy. No definite seizures were seen during the study.  PLora Havens  CT Angio Chest/Abd/Pel for Dissection W and/or Wo Contrast  Result Date: 01/21/2022 CLINICAL DATA:  Chest and back pain. EXAM: CT ANGIOGRAPHY CHEST, ABDOMEN AND PELVIS TECHNIQUE: Non-contrast CT of the chest was initially obtained. Multidetector CT imaging through the chest, abdomen and pelvis was performed using the standard protocol during bolus administration of intravenous contrast. Multiplanar reconstructed images and MIPs were obtained and reviewed to evaluate the vascular anatomy. RADIATION DOSE REDUCTION:  This exam was performed according to the departmental dose-optimization program which includes automated exposure control, adjustment of the mA and/or kV according to patient size and/or use of iterative reconstruction technique. CONTRAST:  1054mOMNIPAQUE IOHEXOL 350 MG/ML SOLN COMPARISON:  CT PE protocol 12/09/2021 FINDINGS: CTA CHEST FINDINGS Cardiovascular: No evidence for aortic dissection or aneurysm. There are mild calcified atherosclerotic plaque throughout the aorta. There is no large central pulmonary embolism. The heart is moderately enlarged. There is no pericardial effusion. Left-sided central venous catheter ends in the right atrium. Mediastinum/Nodes: No enlarged mediastinal, hilar, or axillary lymph nodes. Thyroid gland, trachea, and esophagus demonstrate no significant findings. Lungs/Pleura: There is a ground-glass nodular density in the right upper lobe measuring 7 mm image 11/27. There is minimal atelectasis in the lung bases. The lungs are otherwise clear. No pleural effusion or pneumothorax. Musculoskeletal: No chest wall abnormality. No acute or significant osseous findings. Review of the MIP images confirms the above findings. CTA ABDOMEN AND PELVIS FINDINGS VASCULAR Aorta: Normal caliber aorta without aneurysm, dissection, vasculitis or significant stenosis. There are atherosclerotic calcifications throughout the aorta. Celiac: Patent without evidence of aneurysm, dissection, vasculitis or significant stenosis. SMA: Patent without evidence of aneurysm, dissection, vasculitis or significant stenosis. Renals: Limited evaluation secondary to artifact from patient's arms and respiratory motion artifact. Grossly patent bilaterally. IMA: Not well seen. Inflow: Patent without evidence of aneurysm, dissection, vasculitis or significant stenosis. Veins: No obvious venous abnormality within the limitations of this arterial phase study. Review of the MIP images confirms the above findings. NON-VASCULAR  Hepatobiliary: No focal liver abnormality is seen. Status post cholecystectomy. No biliary dilatation. Pancreas: Unremarkable. No pancreatic ductal dilatation or surrounding inflammatory changes. Spleen: Mildly enlarged. Adrenals/Urinary Tract: Rounded hypodensity in the left kidney is too small to characterize, likely a cyst. Otherwise, the kidneys, adrenal glands and bladder are  within normal limits. Stomach/Bowel: The colon is fluid filled. The cecum is midline and mildly dilated. Questionable stricture seen just proximal to the terminal ileum. No wall thickening or surrounding inflammation identified. The appendix is not seen. There is sigmoid colon diverticulosis. Small bowel and stomach are nondilated. No free air. Lymphatic: No enlarged lymph nodes. Reproductive: There is a 4.8 x 4.5 cm left adnexal cystic area. Right adnexa and uterus appear within normal limits. Other: No ascites or focal abdominal wall hernia. Musculoskeletal: No acute or significant osseous findings. Review of the MIP images confirms the above findings. IMPRESSION: 1. No evidence for aortic dissection or aneurysm. 2. Moderate cardiomegaly. 3. No acute cardiopulmonary process. 4. The cecum is dilated and fluid-filled and there is questionable stricture or mass within the colon just proximal to the terminal ileum. Recommend clinical correlation and follow-up. 5. Mild splenomegaly. 6. Subcentimeter left Bosniak 2 lesion, too small to characterize. No follow-up imaging is recommended. JACR 2018 Feb; 264-273, Management of the Incidental RenalMass on CT, [employer] 2021; 814-848, Bosniak Classification of Cystic Renal Masses, Version 2019. 7. 7 mm right ground-glass pulmonary nodule within the upper lobe. Per Fleischner Society Guidelines, recommend a non-contrast Chest CT at 6-12 months to confirm persistence, then additional non-contrast Chest CTs every 2 years until 5 years. If nodule grows or develops solid component(s), consider  resection. These guidelines do not apply to immunocompromised patients and patients with cancer. Follow up in patients with significant comorbidities as clinically warranted. For lung cancer screening, adhere to Lung-RADS guidelines. Reference: Radiology. 2017; 284(1):228-43. 8. 4.8 cm left adnexal simple-appearing cyst. Recommend follow-up pelvic US in 6-12 months. Reference: JACR 2020 Feb;17(2):248-254 Electronically Signed   By: Ronney Asters M.D.   On: 01/21/2022 17:26   DG Chest Port 1 View  Result Date: 01/21/2022 CLINICAL DATA:  Mental status changes. Possible sepsis. Recent exchange tunneled central venous catheter. EXAM: PORTABLE CHEST 1 VIEW COMPARISON:  12/09/2021 FINDINGS: Stable cardiac enlargement. Left jugular tunneled central line present with the catheter tip at the SVC/RA junction. There is no evidence of pulmonary edema, consolidation, pneumothorax, nodule or pleural fluid. IMPRESSION: Stable cardiac enlargement and positioning of tunneled left jugular central venous catheter. Electronically Signed   By: Aletta Edouard M.D.   On: 01/21/2022 14:16   CT Head Wo Contrast  Result Date: 01/21/2022 CLINICAL DATA:  Mental status changes of unknown cause, confusion EXAM: CT HEAD WITHOUT CONTRAST TECHNIQUE: Contiguous axial images were obtained from the base of the skull through the vertex without intravenous contrast. RADIATION DOSE REDUCTION: This exam was performed according to the departmental dose-optimization program which includes automated exposure control, adjustment of the mA and/or kV according to patient size and/or use of iterative reconstruction technique. COMPARISON:  CT head 07/23/2020 FINDINGS: Brain: Generalized atrophy. Normal ventricular morphology. No midline shift or mass effect. Small vessel chronic ischemic changes of deep cerebral white matter. No intracranial hemorrhage, mass lesion, evidence of acute infarction, or extra-axial fluid collection. Vascular: No hyperdense  vessels. Atherosclerotic calcification of internal carotid and vertebral arteries at skull base Skull: Demineralized but intact Sinuses/Orbits: Clear Other: N/A IMPRESSION: Atrophy with small vessel chronic ischemic changes of deep cerebral white matter. No acute intracranial abnormalities. Electronically Signed   By: Lavonia Dana M.D.   On: 01/21/2022 13:53    Family Communication: Daughter and spouse at bedside  Disposition: Status is: Inpatient Remains inpatient appropriate because: Severity of illness, continuous EEG monitoring, persistent encephalopathy Planned Discharge Destination: Home  Patrecia Pour, MD 01/22/2022 8:07  PM Page by [email or web address]

## 2022-01-22 NOTE — Plan of Care (Signed)
  Problem: Education: Goal: Knowledge of General Education information will improve Description: Including pain rating scale, medication(s)/side effects and non-pharmacologic comfort measures Outcome: Progressing   Problem: Clinical Measurements: Goal: Ability to maintain clinical measurements within normal limits will improve Outcome: Progressing Goal: Will remain free from infection Outcome: Progressing   Problem: Skin Integrity: Goal: Risk for impaired skin integrity will decrease Outcome: Progressing   

## 2022-01-22 NOTE — TOC Initial Note (Signed)
Transition of Care [facility]) - Initial/Assessment Note    Patient Details  Name: [name] [name] MRN: [id number] Date of Birth: 01-Jan-1951  Transition of [facility]) CM/SW Contact:    [name] Mons, RN Phone Number: 01/22/2022, 9:55 AM  Clinical Narrative:   Admitted with AMS, hx of  multiple small bowel, right colon resections, short bowel syndrome , TPN > 15 yrs.   NCM @ bedside with pt's supportive husband  [name] [name] and daughter [name] [name], pt confused, lethargic. NCM introduce self and explained role. Per daughter PTA was independent with ADL's. Husband assist with home TPN therapy which is provided by [place]. Pt receives TPN 5 x/week @ hs. Contact for NutrishareJiles Garter RN  [phone].   Order will be needed from MD to resume home TPN @ D/C and faxed to [phone] within 24 hrs before d/c. Husband agreeable to home health services. Preference: [facility]. [facility]erral made with [facility] @ [facility] and accepted  pending MD's order.   GI following....  TOC following and will assist with TOC needs....  Expected Discharge Plan: [place] Barriers to Discharge: Continued Medical Work up   Patient Goals and CMS Choice        Expected Discharge Plan and Services Expected Discharge Plan: [place]   Discharge Planning Services: CM Consult                                          Prior Living Arrangements/Services   Lives with:: Spouse Patient language and need for interpreter reviewed:: Yes        Need for Family Participation in Patient Care: Yes (Comment) Care giver support system in place?: Yes (comment) Current home services: DME (3 in 1/[facility], RW) Criminal Activity/Legal Involvement Pertinent to Current Situation/Hospitalization: No - Comment as needed  Activities of Daily Living Home Assistive Devices/Equipment: None ADL Screening (condition at time of admission) Patient's cognitive ability  adequate to safely complete daily activities?: No Is the patient deaf or have difficulty hearing?: No Does the patient have difficulty seeing, even when wearing glasses/contacts?: No Does the patient have difficulty concentrating, remembering, or making decisions?: Yes Patient able to express need for assistance with ADLs?: No Does the patient have difficulty dressing or bathing?: Yes Independently performs ADLs?: No Communication: Independent Dressing (OT): Needs assistance Is this a change from baseline?: Change from baseline, expected to last <3days Grooming: Needs assistance Is this a change from baseline?: Change from baseline, expected to last <3 days Feeding: Needs assistance Is this a change from baseline?: Change from baseline, expected to last <3 days Bathing: Needs assistance Is this a change from baseline?: Change from baseline, expected to last <3 days Toileting: Needs assistance Is this a change from baseline?: Change from baseline, expected to last <3 days In/Out Bed: Needs assistance Is this a change from baseline?: Change from baseline, expected to last <3 days Walks in Home: Independent Does the patient have difficulty walking or climbing stairs?: Yes Weakness of Legs: Both Weakness of Arms/Hands: None  Permission Sought/Granted                  Emotional Assessment Appearance:: Appears stated age       Alcohol / Substance Use: Not Applicable Psych Involvement: No (comment)  Admission diagnosis:  Bacteremia [R78.81] Altered mental status, unspecified altered mental status type [R41.82] Acute  metabolic encephalopathy [T97.74] Patient Active Problem List   Diagnosis Date Noted   Acute metabolic encephalopathy 14/23/9532   Chills 01/15/2022   Medication management 01/15/2022   Nonrheumatic mitral valve regurgitation    STEC (Shiga toxin-producing Escherichia coli) 07/01/2021   Streptococcal sepsis, unspecified ([place]) 06/30/2021   Left arm cellulitis  06/29/2021   Cellulitis of left breast 06/29/2021   Humerus fracture - right 06/29/2021   New onset a-fib ([place]) 06/29/2021   Carotid artery stenosis 10/31/2020   Bacteremia 10/27/2020   Macrocytic anemia 10/27/2020   NAFLD (nonalcoholic fatty liver disease) 09/21/2020   Stroke (cerebrum) ([place]) 07/25/2020   History of CVA (cerebrovascular accident) 07/24/2020   Thrombocytopenia ([place]) 07/24/2020   Stage 3a chronic kidney disease (CKD) ([place]) 07/24/2020   Pressure injury of skin 07/24/2020   Stroke ([place]) 07/23/2020   Carnitine deficiency ([place]) 05/25/2018   Hypotension 12/30/2017   Bradycardia 12/30/2017   Deep venous thrombosis ([place]) left subclavian vein 07/31/2017   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[place]) 07/31/2017   Degenerative joint disease 07/08/2016   PICC line infection 05/16/2015   H/O mesenteric infarction 11/17/2013   Splenomegaly    Hx of adenomatous colonic polyps 02/33/4356   Chronic systolic heart failure (HCC) - LVEF 35-40% 12/25/2011   Hypokalemia 12/14/2011   Acute renal failure superimposed on stage 3a chronic kidney disease ([place]) 12/08/2011   Hyponatremia 12/08/2011   Osteoporosis 03/14/2010   Anemia of chronic disease 10/01/2009   GERD (gastroesophageal reflux disease) 11/19/2008   ALKALINE PHOSPHATASE, ELEVATED 10/12/2008   VITAMIN D DEFICIENCY 01/19/2008   TRANSAMINASES, SERUM, ELEVATED 01/19/2008   Colonic mass 11/19/2004   Acquired short bowel syndrome 11/20/2003   PCP:  [name], MD Pharmacy:   [phone] [place], [place] - [address] [place] [address] Phone: [phone] Fax: [phone]  [id number] [place] [place]Phone: [phone]: [address]Phone: [phone]: [phone]    Social Determinants of Health  (SDOH) Interventions    Readmission Risk Interventions     No data to display

## 2022-01-22 NOTE — Consult Note (Signed)
Neurology Consultation    Reason for Consult:Altered Mental Status  CC: Persistent confusion  History obtained from: Chart Review  HISTORY OF PRESENT ILLNESS   HPI   [name] [name] is a 71 y.o. 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    Patient reportedly became acutely confused on 01/20/22, with further worsening of confusion noted into the 01/21/2022, ultimately resulting in being able to answer questions within the yes or no responses, reportedly her representing a significant decline from her baseline which is reported as independent except for driving by family. She has a history of recurrent bacteremia, first noted in November 2015 and again in September 2016 at which time she was noted to have Serratia marcescens bacteremia.  Was subsequent diagnosed with Klebsiella pneumonia bacteremia in February 2018 and again in July 2020, followed by Streptococcus pyogenes bacteremia in February 2023.  Was recently diagnosed with bacteremia again, this time gram-negative rod by preliminary blood culture results.  She has been working closely with infectious disease as an outpatient.  Her PICC line was exchanged on 01/14/2022, and her outpatient infectious disease physician modified her IV antibiotics from Ancef to cefepime on 01/16/2022 for a 10 day course. She underwent TEE on 01/14/2022 showing no evidence of  valvular vegetation or any evidence of left atrial appendage thrombus. Of note, in the setting of a history of multiple previous DVTs, she is now chronically anticoagulated on Xarelto, family reporting that she exhibits good compliance with this anticoagulant. History also notable for small bowel infarct in 2005 resulting in multiple resection procedures of the small bowel, ultimately resulting in short bowel syndrome, for which she is on TPN 4 days a week (Monday through Thursday).  Strokes in [name] [name] of 2022- Numerous small scattered acute cortical/subcortical infarcts within the right cerebral hemisphere, predominantly within the right MCA territory, as well as right MCA/ACA and right MCA/PCA watershed territories. Infarcts are present within the right frontal lobe, parietal lobe, temporal lobe and temporal occipital junction. Additionally, there are several scattered tiny acute infarcts within the left frontal white matter within the left MCA/ACA watershed territory. Thought to be embolic but no definite source identified  Last dose of cefepime 9/13 at 0100 Low does ativan ordered by primary team for discomfort and agitation. Patient is currently not cooperating with PO medications, Xarelto will be switched to IV heparin.  GI team consulted as well as infectious disease  ROS: Unable to obtain due to altered mental status.   PAST MEDICAL HISTORY    Past Medical History:  Past Medical History:  Diagnosis Date   Abnormal LFTs 2013   Allergic rhinosinusitis    Allergy    Anemia of chronic disease    At risk for dental problems    Atypical nevus    Bacteremia 10/27/2020   Bacteremia due to Klebsiella pneumoniae 12/12/2011   Bacterial overgrowth syndrome    Brachial vein thrombus, left ([place]) 10/08/2012   Carnitine deficiency ([place]) 05/25/2018   Carotid stenosis  Carotid US (9/15):  R 40-59%; L 1-39% >> FU 1 year   Cataract    Closed left subtrochanteric femur fracture (HCC) 12/31/2017    Clotting disorder (HCC)    Congestive heart failure (CHF) (HCC)    EF 25-30%   COVID-19 2022   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   Fever 10/29/2020   Fracture of left clavicle    GERD (gastroesophageal reflux disease)    History of blood transfusion 2013   anemia   Hx of cardiovascular stress test    Myoview (9/15):  inf-apical scar; no ischemia; EF 47% - low risk    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 12/12/2011   Osteoporosis    Pancytopenia 10/07/2011   Pathologic fracture of neck of femur ([place])    Personal history of colonic polyps    Renal insufficiency    hx of yrs ago   Serratia marcescens infection - bactermia assoc w/ PICC 01/18/2015   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HCC)    Splenomegaly    By ultrasound   Stroke ([place]) 07/2020   Thrombophilia ([place])    Vitamin D deficiency     No family history on file. Family History  Problem Relation Age of Onset   Diabetes Mother    Hypertension Mother    AAA (abdominal aortic aneurysm) Mother    Colon cancer Neg Hx    Stomach cancer Neg Hx     Allergies:  Allergies  Allergen Reactions   Ivp Dye [Iodinated Contrast Media] Itching    Can use if taking benadryl     Social History:   reports that she has never smoked. She has never used smokeless tobacco. She reports that she does not drink alcohol and does not use drugs.    Medications Medications Prior to Admission  Medication Sig Dispense Refill   acetaminophen (TYLENOL) 500 MG tablet Take 1,000 mg by mouth every 6 (six) hours as needed for mild pain.     ADULT TPN Inject 1,800 mLs into the vein See admin instructions. Pt receives home TPN from [facility]:  1800 mL bag, four nights weekly (Monday, Tuesday, Wednesday, Thursday and Friday for 8 hours (includes 1 hour taper up and down).     Calcium Carb-Cholecalciferol (CALCIUM + D3 PO) Take 1 tablet by mouth in the morning and at bedtime.     cetirizine  (ZYRTEC) 10 MG tablet Take 10 mg by mouth daily as needed for allergies.     clobetasol ointment (TEMOVATE) 0.72 % Apply 1 application  topically 2 (two) times daily as needed (irritation).     diphenhydrAMINE (BENADRYL) 25 mg capsule Take 25 mg by mouth every 6 (six) hours as needed for itching or sleep.     empagliflozin (JARDIANCE) 10 MG TABS tablet Take 1 tablet (10 mg total) by mouth daily before breakfast. 90 tablet 3   ergocalciferol (DRISDOL) 200 MCG/ML drops Take 6 mLs (48,000 Units total) by mouth 3 (three) times a week. 720 mL 3   furosemide (LASIX) 40 MG tablet Take 2 tablets (80 mg total) by mouth daily. (Patient taking differently: Take 40 mg by mouth 2 (two) times daily. Morning & early afternoon.) 180 tablet 3   Heparin Sodium, Porcine, (HEPARIN LOCK FLUSH IJ) Inject 5 mLs as directed See admin instructions. Monday, Tuesday, Wednesday, Thursday and Friday in the morning with TPN     losartan (COZAAR) 25 MG tablet Take 1 tablet (  25 mg total) by mouth at bedtime. (Patient taking differently: Take 12.5 mg by mouth at bedtime.) 90 tablet 3   omeprazole (PRILOSEC) 40 MG capsule TAKE 1 CAPSULE BY MOUTH EVERY DAY BEFORE BREAKFAST (Patient taking differently: Take 40 mg by mouth daily.) 90 capsule 1   ondansetron (ZOFRAN-ODT) 8 MG disintegrating tablet TAKE 1 TABLET BY MOUTH EVERY 8 HOURS AS NEEDED FOR NAUSEA OR VOMITING. (Patient taking differently: Take 8 mg by mouth every 8 (eight) hours as needed for nausea or vomiting.) 30 tablet 11   Pediatric Multivit-Minerals (FLINTSTONES GUMMIES PO) Take 4 tablets by mouth in the morning.     potassium chloride SA (KLOR-CON M20) 20 MEQ tablet Take 1 tablet (20 mEq total) by mouth daily. 90 tablet 3   sodium chloride 0.9 % infusion Inject 900 mLs into the vein every 14 (fourteen) days. Uses for TPN     spironolactone (ALDACTONE) 25 MG tablet TAKE 1 TABLET (25 MG TOTAL) BY MOUTH DAILY. (Patient taking differently: Take 25 mg by mouth in the morning.) 90  tablet 3   Teduglutide, rDNA, 5 MG KIT Inject 3.31 Units into the skin every morning. 1 kit 0   vitamin E 1000 UNIT capsule Take 1,000 Units by mouth in the morning.     XARELTO 20 MG TABS tablet TAKE 1 TABLET BY MOUTH DAILY WITH SUPPER. (Patient taking differently: Take 20 mg by mouth daily with supper.) 90 tablet 2   diazepam (VALIUM) 2 MG tablet Take 1 tablet (2 mg total) by mouth every 6 (six) hours as needed for anxiety. Take one tablet one hour prior to scan for claustrophobia. (Patient not taking: Reported on 01/15/2022) 1 tablet 0   fluconazole (DIFLUCAN) 150 MG tablet Take 1 tablet today.  Take second tablet 3 days later. (Patient not taking: Reported on 01/22/2022) 2 tablet 0    EXAMINATION    Current vital signs:    01/22/2022    4:22 AM 01/22/2022    2:00 AM 01/22/2022    1:01 AM  Vitals with BMI  Weight   143 lbs 15 oz  BMI   31.54  Systolic 008 676   Diastolic 65 48   Pulse 62 65     Examination:  GENERAL: Drowsy, restless and agitated. Appears ill and uncomfortable HEENT: - Normocephalic and atraumatic, dry mm, no lymphadenopathy, no Thyromegally LUNGS - Clear to auscultation bilaterally CV - S1S2 RRR, equal pulses bilaterally. ABDOMEN - Soft, hypoactive bowel sounds, tender to palpation  Ext: warm, well perfused, intact peripheral pulses, no pedal edema  NEURO:  Mental Status: Responds to name with physical stimulation but does not answer questions Language: sparse, moaning frequently.  Cranial Nerves:  II: PERRL.  III, IV, VI: Eyes are midline with forced eye opening, does not keep eyes open during assessment but will spontaneously open then V: Sensation intact V1-3 symmetrically  VII: no facial asymmetry   VIII: hearing intact to voice IX, X: Palate elevates symmetrically. Phonation is normal.  XI: head is midline XII: tongue is midline without fasciculations. Motor:  Moves all extremities antigravity and spontaneously. Able to sit up in the bed independently   Tone: is normal and bulk is normal DTRs: 2+ and symmetrical throughout   Sensation- localizes in all extremities Coordination: unable able to follow commands to participate in FNF or HKS, but no gross ataxia noted with movements.  Gait- deferred    LABS   I have reviewed labs in epic and the results pertinent to this consultation  are:  Lab Results  Component Value Date   LDLCALC 20 07/24/2020   Lab Results  Component Value Date   ALT 20 01/22/2022   AST 28 01/22/2022   ALKPHOS 145 (H) 01/22/2022   BILITOT 0.7 01/22/2022   Lab Results  Component Value Date   HGBA1C 5.4 07/24/2020   Lab Results  Component Value Date   WBC 7.4 01/22/2022   HGB 11.7 (L) 01/22/2022   HCT 35.6 (L) 01/22/2022   MCV 91.3 01/22/2022   PLT 115 (L) 01/22/2022   Lab Results  Component Value Date   VITAMINB12 421 08/13/2021   Lab Results  Component Value Date   FOLATE 9.7 10/17/2020   Lab Results  Component Value Date   NA 135 01/22/2022   K 3.9 01/22/2022   CL 103 01/22/2022   CO2 23 01/22/2022     DIAGNOSTIC IMAGING/PROCEDURES   I have reviewed the images obtained:, as below    CT-head- Atrophy with small vessel chronic ischemic changes of deep cerebral white matter  8/14 MRI brain w wo contrast-  Negative internal auditory imaging. Multifocal cerebral ischemia with extension since the March brain MRI last year. This includes late subacute to early chronic appearing bilateral PCA infarcts with developing encephalomalacia now in both occipital poles. No superimposed acute intracranial abnormality.   Initial EEG 01/22/2022 This study showed generalized periodic discharges with triphasic morphology. This pattern can be seen due to toxic-metabolic causes like cefepime toxicity.  However, due to the frequency,would recommend LTM monitoring if clinically indicated. Additionally there is moderate to severe diffuse encephalopathy. No definite seizures were seen during the  study.  ASSESSMENT/PLAN    Assessment:  71 y.o. 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 She recently had a PICC line infection and it replaced. She was prescribed 10 days of Cefepime. Her altered mental status began on 9/11 and has progressively worsened. Cefepime has since been discontinued (last dose 9/13 0100). We recommend further work up including MRI, EEG, and dementia panel. May consider LP if mentation does not improve given recent infection. She is currently spitting out her pills, primary team will switch xarelto to heparin IV.   Recommendations: - MRI Brain w wo contrast when able  - Continuous EEG ordered  - Limit sedating agents - Delerium precautions - Dementia panel ordered- B12, thiamine, folate, ESR, TSH  -- Patient seen and examined by NP/APP with MD. MD to update note as needed.   Janine Ores, DNP, FNP-BC [facility] Pager: [phone]  NEUROHOSPITALIST ADDENDUM Performed a face to face diagnostic evaluation.   I have reviewed the contents of history and physical exam as documented by PA/ARNP/Resident and agree with above documentation.  I have discussed and formulated the above plan as documented. Edits to the note have been made as needed.  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 GPDs on LTM and mentation appears to be  improving and now oriented to self, follows commands. Still reports feeling foggy in  her head. Encephalopathy labs unremarkable so far with thiamine levels still pending. Can take off LTM if mentation improving and no seizures in AM.  Donnetta Simpers, MD [facility] [phone]   If 7pm to 7am, please call on call as listed on AMION.

## 2022-01-23 ENCOUNTER — Telehealth: Payer: Self-pay

## 2022-01-23 DIAGNOSIS — R7881 Bacteremia: Secondary | ICD-10-CM | POA: Diagnosis not present

## 2022-01-23 DIAGNOSIS — K912 Postsurgical malabsorption, not elsewhere classified: Secondary | ICD-10-CM | POA: Diagnosis not present

## 2022-01-23 DIAGNOSIS — R933 Abnormal findings on diagnostic imaging of other parts of digestive tract: Secondary | ICD-10-CM | POA: Diagnosis not present

## 2022-01-23 DIAGNOSIS — R4182 Altered mental status, unspecified: Secondary | ICD-10-CM | POA: Diagnosis not present

## 2022-01-23 DIAGNOSIS — G9341 Metabolic encephalopathy: Secondary | ICD-10-CM | POA: Diagnosis not present

## 2022-01-23 LAB — COMPREHENSIVE METABOLIC PANEL
ALT: 23 U/L (ref 0–44)
AST: 33 U/L (ref 15–41)
Albumin: 2.5 g/dL — ABNORMAL LOW (ref 3.5–5.0)
Alkaline Phosphatase: 133 U/L — ABNORMAL HIGH (ref 38–126)
Anion gap: 7 (ref 5–15)
BUN: 27 mg/dL — ABNORMAL HIGH (ref 8–23)
CO2: 26 mmol/L (ref 22–32)
Calcium: 8.7 mg/dL — ABNORMAL LOW (ref 8.9–10.3)
Chloride: 104 mmol/L (ref 98–111)
Creatinine, Ser: 1.27 mg/dL — ABNORMAL HIGH (ref 0.44–1.00)
GFR, Estimated: 45 mL/min — ABNORMAL LOW (ref >60)
Glucose, Bld: 300 mg/dL — ABNORMAL HIGH (ref 70–99)
Potassium: 3.8 mmol/L (ref 3.5–5.1)
Sodium: 137 mmol/L (ref 135–145)
Total Bilirubin: 0.7 mg/dL (ref 0.3–1.2)
Total Protein: 8.1 g/dL (ref 6.5–8.1)

## 2022-01-23 LAB — GLUCOSE, CAPILLARY
Glucose-Capillary: 293 mg/dL — ABNORMAL HIGH (ref 70–99)
Glucose-Capillary: 338 mg/dL — ABNORMAL HIGH (ref 70–99)

## 2022-01-23 LAB — CBC
HCT: 34.4 % — ABNORMAL LOW (ref 36.0–46.0)
Hemoglobin: 11.4 g/dL — ABNORMAL LOW (ref 12.0–15.0)
MCH: 30.3 pg (ref 26.0–34.0)
MCHC: 33.1 g/dL (ref 30.0–36.0)
MCV: 91.5 fL (ref 80.0–100.0)
Platelets: 121 10*3/uL — ABNORMAL LOW (ref 150–400)
RBC: 3.76 MIL/uL — ABNORMAL LOW (ref 3.87–5.11)
RDW: 16.3 % — ABNORMAL HIGH (ref 11.5–15.5)
WBC: 7 10*3/uL (ref 4.0–10.5)
nRBC: 0 % (ref 0.0–0.2)

## 2022-01-23 LAB — TRIGLYCERIDES: Triglycerides: 99 mg/dL (ref <150)

## 2022-01-23 LAB — MAGNESIUM: Magnesium: 2.2 mg/dL (ref 1.7–2.4)

## 2022-01-23 LAB — APTT: aPTT: 45 seconds — ABNORMAL HIGH (ref 24–36)

## 2022-01-23 LAB — RPR: RPR Ser Ql: NONREACTIVE

## 2022-01-23 LAB — HEPARIN LEVEL (UNFRACTIONATED): Heparin Unfractionated: 0.14 IU/mL — ABNORMAL LOW (ref 0.30–0.70)

## 2022-01-23 LAB — PHOSPHORUS: Phosphorus: 2.4 mg/dL — ABNORMAL LOW (ref 2.5–4.6)

## 2022-01-23 MED ORDER — INSULIN ASPART 100 UNIT/ML IJ SOLN: 0.0000 [IU] | Freq: Four times a day (QID) | INTRAMUSCULAR | Status: DC

## 2022-01-23 MED ORDER — FUROSEMIDE 40 MG PO TABS
40.0000 mg | ORAL_TABLET | Freq: Two times a day (BID) | ORAL | Status: DC
  Administered 2022-01-23: 40 mg via ORAL
  Filled 2022-01-23: qty 1

## 2022-01-23 MED ORDER — LOSARTAN POTASSIUM 25 MG PO TABS
12.5000 mg | ORAL_TABLET | Freq: Every day | ORAL | Status: DC
  Filled 2022-01-23: qty 0.5

## 2022-01-23 MED ORDER — TRAVASOL 10 % IV SOLN
INTRAVENOUS | Status: DC
  Filled 2022-01-23: qty 816

## 2022-01-23 MED ORDER — DIPHENHYDRAMINE HCL 25 MG PO CAPS: 25.0000 mg | ORAL_CAPSULE | Freq: Four times a day (QID) | ORAL | Status: DC | PRN

## 2022-01-23 MED ORDER — RIVAROXABAN 20 MG PO TABS
20.0000 mg | ORAL_TABLET | Freq: Every day | ORAL | Status: DC
  Administered 2022-01-23: 20 mg via ORAL
  Filled 2022-01-23: qty 1

## 2022-01-23 MED ORDER — PANTOPRAZOLE SODIUM 40 MG PO TBEC
40.0000 mg | DELAYED_RELEASE_TABLET | Freq: Every day | ORAL | Status: DC
  Administered 2022-01-23: 40 mg via ORAL
  Filled 2022-01-23: qty 1

## 2022-01-23 NOTE — Plan of Care (Signed)

## 2022-01-23 NOTE — Telephone Encounter (Signed)
Spoke with the patient's daughter (DPR), [name] to get updates on patient's recent hospitalization (she was discharged today). She states she experienced neurotoxicity after taking cefepime for possible TPN line infection.  [name] states the patient is doing great now other than continued confusion that is improving.    Discussed the MitraClip procedure with [name] and she understands her mom's anatomy is suitable for mTEER. She agrees to proceed. She understands she will need a pre-procedure check-up and PAT appointment prior to procedure.   Will call the patient and [name] next Tuesday afternoon to give a few days to rest and recover and make plan for Clip.

## 2022-01-23 NOTE — Progress Notes (Signed)
[name] [name] for Infectious Disease   Reason for visit: Follow up on confusion  Interval History: no fever, WBC 7.0.  Daughter at bedside.      Physical Exam: Constitutional:  Vitals:   01/22/22 2013 01/23/22 0914  BP:  (!) 92/53  Pulse: 87 66  Resp: (!) 24 19  Temp:  98.6 F (37 C)  SpO2: 92% 96%   patient appears in NAD, alert Respiratory: Normal respiratory effort; CTA B Neuro: improved mentation, though still some confusion  Review of Systems: Constitutional: negative for fevers and chills  Lab Results  Component Value Date   WBC 7.0 01/23/2022   HGB 11.4 (L) 01/23/2022   HCT 34.4 (L) 01/23/2022   MCV 91.5 01/23/2022   PLT 121 (L) 01/23/2022    Lab Results  Component Value Date   CREATININE 1.27 (H) 01/23/2022   BUN 27 (H) 01/23/2022   NA 137 01/23/2022   K 3.8 01/23/2022   CL 104 01/23/2022   CO2 26 01/23/2022    Lab Results  Component Value Date   ALT 23 01/23/2022   AST 33 01/23/2022   ALKPHOS 133 (H) 01/23/2022     Microbiology: Recent Results (from the past 240 hour(s))  Blood culture (routine single)     Status: Abnormal   Collection Time: 01/15/22 10:12 AM   Specimen: Blood  Result Value Ref Range Status   MICRO NUMBER: [id number]  Final   SPECIMEN QUALITY: Adequate  Final   Source BLOOD 1  Final   STATUS: FINAL  Final   ISOLATE 1: Enterobacter aerogenes (AA)  Final    Comment: Klebsiella aerogenes (Enterobacter) from aerobic bottle only   COMMENT: Aerobic and anaerobic bottle received.  Final      Susceptibility   Enterobacter aerogenes - BLOOD CULTURE, NEGATIVE 1    AMOX/CLAVULANIC >=32 Resistant     CEFAZOLIN* >=64 Resistant      * For infections other than uncomplicated UTI caused by E. coli, K. pneumoniae or P. mirabilis: Cefazolin is resistant if MIC > or = 8 mcg/mL. (Distinguishing susceptible versus intermediate for isolates with MIC < or = 4 mcg/mL requires additional testing.)     CEFTAZIDIME >=64 Resistant      CEFEPIME <=1 Sensitive     CEFTRIAXONE 16 Resistant     CIPROFLOXACIN <=0.25 Sensitive     LEVOFLOXACIN <=0.12 Sensitive     GENTAMICIN <=1 Sensitive     IMIPENEM 1 Sensitive     PIP/TAZO >=128 Resistant     TOBRAMYCIN <=1 Sensitive     TRIMETH/SULFA* <=20 Sensitive      * For infections other than uncomplicated UTI caused by E. coli, K. pneumoniae or P. mirabilis: Cefazolin is resistant if MIC > or = 8 mcg/mL. (Distinguishing susceptible versus intermediate for isolates with MIC < or = 4 mcg/mL requires additional testing.) Legend: S = Susceptible  I = Intermediate R = Resistant  NS = Not susceptible * = Not tested  NR = Not reported **NN = See antimicrobic comments   Blood culture (routine single)     Status: Abnormal   Collection Time: 01/15/22 10:12 AM   Specimen: Blood  Result Value Ref Range Status   MICRO NUMBER: [id number]  Final   SPECIMEN QUALITY: Adequate  Final   Source BLOOD 2  Final   STATUS: FINAL  Final   ISOLATE 1: Enterobacter aerogenes (AA)  Final    Comment: Klebsiella aerogenes (Enterobacter) from aerobic bottle only   COMMENT: Aerobic and  anaerobic bottle received.  Final      Susceptibility   Enterobacter aerogenes - BLOOD CULTURE, NEGATIVE 1    AMOX/CLAVULANIC >=32 Resistant     CEFAZOLIN* >=64 Resistant      * For infections other than uncomplicated UTI caused by E. coli, K. pneumoniae or P. mirabilis: Cefazolin is resistant if MIC > or = 8 mcg/mL. (Distinguishing susceptible versus intermediate for isolates with MIC < or = 4 mcg/mL requires additional testing.)     CEFTAZIDIME >=64 Resistant     CEFEPIME <=1 Sensitive     CEFTRIAXONE 16 Resistant     CIPROFLOXACIN <=0.25 Sensitive     LEVOFLOXACIN <=0.12 Sensitive     GENTAMICIN <=1 Sensitive     IMIPENEM 1 Sensitive     PIP/TAZO >=128 Resistant     TOBRAMYCIN <=1 Sensitive     TRIMETH/SULFA* <=20 Sensitive      * For infections other than uncomplicated UTI caused by E. coli, K. pneumoniae  or P. mirabilis: Cefazolin is resistant if MIC > or = 8 mcg/mL. (Distinguishing susceptible versus intermediate for isolates with MIC < or = 4 mcg/mL requires additional testing.) Legend: S = Susceptible  I = Intermediate R = Resistant  NS = Not susceptible * = Not tested  NR = Not reported **NN = See antimicrobic comments   Blood Culture (routine x 2)     Status: None (Preliminary result)   Collection Time: 01/21/22  2:07 PM   Specimen: BLOOD  Result Value Ref Range Status   Specimen Description BLOOD BLOOD RIGHT HAND  Final   Special Requests   Final    BOTTLES DRAWN AEROBIC AND ANAEROBIC Blood Culture results may not be optimal due to an inadequate volume of blood received in culture bottles   Culture   Final    NO GROWTH 2 DAYS Performed at [facility], [place] [address] [place], [place] [address]    Report Status PENDING  Incomplete  Resp Panel by RT-PCR (Flu A&B, Covid) Anterior Nasal Swab     Status: None   Collection Time: 01/21/22  2:15 PM   Specimen: Anterior Nasal Swab  Result Value Ref Range Status   SARS Coronavirus 2 by RT PCR NEGATIVE NEGATIVE Final    Comment: (NOTE) SARS-CoV-2 target nucleic acids are NOT DETECTED.  The SARS-CoV-2 RNA is generally detectable in upper respiratory specimens during the acute phase of infection. The lowest concentration of SARS-CoV-2 viral copies this assay can detect is 138 copies/mL. A negative result does not preclude SARS-Cov-2 infection and should not be used as the sole basis for treatment or other patient management decisions. A negative result may occur with  improper specimen collection/handling, submission of specimen other than nasopharyngeal swab, presence of viral mutation(s) within the areas targeted by this assay, and inadequate number of viral copies(<138 copies/mL). A negative result must be combined with clinical observations, patient history, and epidemiological information. The expected result is  Negative.  Fact Sheet for Patients:  EntrepreneurPulse.com.au  Fact Sheet for Healthcare Providers:  IncredibleEmployment.be  This test is no t yet approved or cleared by the [place] FDA and  has been authorized for detection and/or diagnosis of SARS-CoV-2 by FDA under an Emergency Use Authorization (EUA). This EUA will remain  in effect (meaning this test can be used) for the duration of the COVID-19 declaration under Section 564(b)(1) of the Act, 21 U.S.C.section 360bbb-3(b)(1), unless the authorization is terminated  or revoked sooner.  Influenza A by PCR NEGATIVE NEGATIVE Final   Influenza B by PCR NEGATIVE NEGATIVE Final    Comment: (NOTE) The Xpert Xpress SARS-CoV-2/FLU/RSV plus assay is intended as an aid in the diagnosis of influenza from Nasopharyngeal swab specimens and should not be used as a sole basis for treatment. Nasal washings and aspirates are unacceptable for Xpert Xpress SARS-CoV-2/FLU/RSV testing.  Fact Sheet for Patients: EntrepreneurPulse.com.au  Fact Sheet for Healthcare Providers: IncredibleEmployment.be  This test is not yet approved or cleared by the [place] FDA and has been authorized for detection and/or diagnosis of SARS-CoV-2 by FDA under an Emergency Use Authorization (EUA). This EUA will remain in effect (meaning this test can be used) for the duration of the COVID-19 declaration under Section 564(b)(1) of the Act, 21 U.S.C. section 360bbb-3(b)(1), unless the authorization is terminated or revoked.  Performed at [facility], [address], [place], [place] [address]   Urine Culture     Status: None   Collection Time: 01/21/22  3:56 PM   Specimen: Urine, Random  Result Value Ref Range Status   Specimen Description   Final    URINE, RANDOM Performed at [facility], [address] [place], [place] [address]    Special  Requests   Final    NONE Performed at [facility], [address] [place], [place] [address]    Culture   Final    NO GROWTH Performed at [facility], [place] [address] [place], [place] [address]    Report Status 01/22/2022 FINAL  Final  Blood Culture (routine x 2)     Status: None (Preliminary result)   Collection Time: 01/21/22  5:38 PM   Specimen: BLOOD RIGHT ARM  Result Value Ref Range Status   Specimen Description   Final    BLOOD RIGHT ARM Performed at [facility], [address] [place], [place] [address]    Special Requests   Final    BOTTLES DRAWN AEROBIC AND ANAEROBIC Blood Culture adequate volume Performed at [facility], [address] [place], [place] [address]    Culture   Final    NO GROWTH 2 DAYS Performed at [facility], [place] [address], [place], [place] [address]    Report Status PENDING  Incomplete    Impression/Plan:  1. Confusion - much improved today after stopping cefepime.  Much more alert.  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   2.  Bacteremia - had been on antibiotics for bacteremia but repeat blood cultures here are negative, no leukocytosis or fever.  No further antibiotics indicated at this time.   3.  TPN - no current indication for antibiotics lock at this time.  Continue with heparin.

## 2022-01-23 NOTE — Plan of Care (Signed)
Neurology plan of care  Per Dr. Bonner Puna patient's mentation is significantly improved. Her EEG has also significantly improved and her GPDs are much less frequent. I think her mental status changes as well as her EEG abnormalities can all be explained by cefepime encephalopathy. ESR 40 which is not surprising given her recent infection and warrants no further specific workup. B12, TSH, RPR all neg. B1 pending, appreciate primary team following up results. Given her improvement no MRI needed at this time. Neurology to sign off but please re-engage if additional neurologic concerns arise.  Su Monks, MD [facility] [phone]  If 7pm- 7am, please page neurology on call as listed in [place].

## 2022-01-23 NOTE — Procedures (Addendum)
Patient Name: [name] [name]  MRN: [id number]  Epilepsy Attending: Lora Havens  Referring Physician/Provider: Janine Ores, NP  Duration: 01/22/2022 1651 to 01/23/2022 1154   Patient history: 71yo F with ams. EEG to evaluate for seizure   Level of alertness: lethargic   AEDs during EEG study: None   Technical aspects: This EEG study was done with scalp electrodes positioned according to the 10-20 International system of electrode placement. Electrical activity was reviewed with band pass filter of 1-'70Hz'$ , sensitivity of 7 uV/mm, display speed of 57m/sec with a '60Hz'$  notched filter applied as appropriate. EEG data were recorded continuously and digitally stored.  Video monitoring was available and reviewed as appropriate.   Description: EEG initially showed continuous generalized 3-'5hz'$  theta-delta slowing. Gradually eeg improved and showed predominantly 5-'8hz'$  theta-alpha activity admixed with 2-'3Hz'$  delta slowing.    Generalized periodic discharges with triphasic morphology were also noted at 1-2.5 Hz which gradually improved to 1-1.'5Hz'$ , more prominent when awake/stimulated.  Hyperventilation and photic stimulation were not performed.    Patient event button was pressed on 01/22/2022 at 1713 for patient being confused and at 1730 and 1743 or patient crying. Concomitant eeg before, during and after the event didn't show any eeg change to suggest seizure,     ABNORMALITY - Periodic discharges with triphasic morphology, generalized ( GPDs) - Continuous slow, generalized   IMPRESSION: This study showed generalized periodic discharges with triphasic morphology which gradually improved and were most likely due to toxic-metabolic causes like cefepime toxicity.  Additionally there is moderate diffuse encephalopathy. No definite seizures were seen during the study.   Patient event button was pressed on 01/22/2022 at 1713 for patient being confused and at 1730 and 1743 or patient crying without  concomitant eeg change. These events were NOT epileptic.     [name] [name]

## 2022-01-23 NOTE — Discharge Summary (Signed)
Physician Discharge Summary   Patient: [name] [name] MRN: [id number] DOB: April 20, 1951  Admit date:     01/21/2022  Discharge date: 01/23/22  Discharge Physician: Patrecia Pour   PCP: [name], MD   Recommendations at discharge:  Follow up with PCP in 1-2 weeks, suggest recheck CBC and BMP. Note cefepime-induced encephalopathy, added to allergy list.  Follow up with GI, Dr. Carlean Purl, to consider any further evaluation of the abnormalities seen on abdominal CT imaging (stricture vs. artifact vs. mass). Outpatient repeat CT or enterography could be considered. Follow up with Cardiology, Dr. Haroldine Laws, per routine. Given reported hx UTI/yeast infection, consider risk/benefit of jardiance.   Follow up with structural heart team for MitraClip. Recommend repeat non-contrast chest CT at 6-12 months to follow up pulmonary nodule in RUL. If stable, repeat CT every 2 years until 5 years. If nodule grows or develops solid component(s), consider resection.   Discharge Diagnoses: Principal Problem:   Acute metabolic encephalopathy Active Problems:   Anemia of chronic disease   Acquired short bowel syndrome   GERD (gastroesophageal reflux disease)   Colonic mass   Chronic systolic heart failure (HCC) - LVEF 35-40%   Stage 3a chronic kidney disease (CKD) (HCC)   Bacteremia   Abnormal finding on GI tract imaging  Hospital Course: [name] [name] is a 71 y.o. 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    She has a history of recurrent bacteremia, first noted in November 2015, then September 2016 (Serratia), Feb 2018, July 2020, June 2022 (Klebsiella), February 2023 (S. pyogenes).    She developed chills and  some disorientation on 9/3 while infusing TPN reminiscent of prior bacteremias. She pursued a previously scheduled TEE on 9/5 as work up for MitraClip for severe MR and had her tunneled central line exchanged at that time as well by IR, Dr. Anselm Pancoast. Expedited follow up in ID clinic 9/6, had blood cultures drawn, empirically given IM ceftriaxone, had plans to start ancef 9/7 though cultures grew GNR, so cefepime was prescribed instead, after the patient declined to present to the ED per ID recommendation. In her usual state of health, essentially independent other than that she doesn't drive, until the evening of 9/11 when her husband reported encephalopathy that worsened prompting ED evaluation.  She was severely encephalopathic without evidence of active infection. ID and neurology were consulted. CT head was nonacute and patient's encephalopathy precluded MRI. EEG revealed GPD's, triphasic. Due to otherwise negative work up, cefepime-induced neurotoxicity was empirically diagnosed. When this medication was withheld and supportive care otherwise continued, the patient's mentation returned to baseline and EEG changes improved as well. She is discharged in stable condition on 9/14.   Please see problem-based details below.   Assessment and Plan: Acute toxic/metabolic encephalopathy: Abrupt onset, nonfocal exam. Suspect precipitating cause is cefepime toxicity in setting of renal dysfunction and baseline abnormal brain neuroimaging/chronic stroke/encephalomalacia. Known bacteremia is possible precipitant but abrupt worsening confusion began after 4-5 days of effective abx and has no SIRS, negative infectious work up here. - Abrupt mental status changes occurred ~3-4 days after initiation of cefepime. Though this was dosed for impaired renal function, renal impairment is a significant risk factor for toxicity. This is current leading suspicion for etiology  and will be held. Last dose was 9/13 ~1am and mentation  made significant improvement overnight into 9/14.  - In absence of severe HTN, and improvement, PRES felt less likely. Though, note from Dr. Mickeal Skinner 01/16/2022 listed this as possible etiology of her tinnitus-type symptoms after MRI 8/14 showed new developing bilateral occipital pole/PCA territory encephalomalacia. MRA (on 9/3) which revealed no infarcts, severe stenoses, or source of infarcts. Negative internal auditory imaging. CT not suggestive of PRES here. No further imaging recommended by neurology. - Metabolic causes including infection, thyroid dysfunction, hyperammonemia, and vitamin deficiencies ruled out. - EEG demonstrated triphasic GPDs consistent with cefepime toxicity.     Enterobacter aerogenes CLABSI, recurrent bacteremia: s/p line exchange 9/5. Currently without fever or leukocytosis s/p cefepime 9/8 - 9/12. Also sensitive to FQs, bactrim, carbapenem.  - ID recommends monitoring off abx at this time.  - ?consider antimicrobial central line lock   Stricture vs. mass in bowel/colon proximal to terminal ileum. Dilated cecum with no evidence of inflammation. No GI symptoms. - GI consult appreciated. High suspicion for peristalsis/postoperative change as well as motion degradation as cause of findings. Advise against endoscopic evaluation in lieu of outpatient repeat CT.   Short gut s/p resection:  - Continue TPN per home Rx.  - Continue home teduglutide GLP-2   Chronic combined HFrEF: Followed by Dr. Haroldine Laws, cardiac catheterization 8/23 showed elevated fillings pressures, mod pulmonary venous HTN, normal output. Currently euvolemic. TTE 12/16/2021 w/LVEF 35-40%, mod-severe LV dilation, G2DD, normal RV, severe LAE and MR, mild-mod AI, mild AS.  - Continue home medications    Nonrheumatic severe MR:  - Follow up with structural heart team after discharge for MitraClip. Note from 9/7 suggests suitable valve.   Sinus bradycardia: Resolved.   Stage IIIa CKD: Current CrCl is  ~11m/min. - Renally dose medications, avoid nephrotoxins.    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    History of multiple DVTs, thrombophilia:  - Continue anticoagulation.    GERD:  - Continue PPI   AOCD:  - Monitor CBC as outpatient - Continue routine follow up with Dr. GLindi Adiefor aranesp. Did not qualify for dose 9/7 w/hgb 10.9g/dl.   Hypoalbuminemia: Noted. No edema.   Demand myocardial ischemia: Very mild troponin elevation 33 > 32 (improved significantly from previous) without ischemic ECG changes or reports of anginal symptoms. Follow up with cardiology.   Thrombocytopenia: Chronic, stable, likely due to splenomegaly which is chronic and mild by CTA this admission. - Monitor for bleeding    SPN: 7 mm right ground-glass pulmonary nodule within the upper lobe. - Guidelines recommend non-contrast chest CT at 6-12 months. If stable, repeat CT every 2 years until 5 years. If nodule grows or develops solid component(s), consider resection.  Consultants: Neurology, ID Procedures performed: Continuous EEG  Disposition: Home Diet recommendation: TPN, heart healthy DISCHARGE MEDICATION: Allergies as of 01/23/2022       Reactions   Cefepime Other (See Comments)   Severe encephalopathy   Ivp Dye [iodinated Contrast Media] Itching   Can use if taking benadryl         Medication List     STOP taking these medications    diazepam 2 MG tablet Commonly known as: Valium   fluconazole 150 MG tablet Commonly known as: Diflucan       TAKE these medications    acetaminophen 500 MG tablet Commonly known as: TYLENOL Take 1,000 mg  by mouth every 6 (six) hours as needed for mild pain.   ADULT TPN Inject 1,800 mLs into the vein See admin instructions. Pt receives home TPN from [facility]:  1800 mL bag, four nights weekly (Monday, Tuesday, Wednesday,  Thursday and Friday for 8 hours (includes 1 hour taper up and down).   CALCIUM + D3 PO Take 1 tablet by mouth in the morning and at bedtime.   cetirizine 10 MG tablet Commonly known as: ZYRTEC Take 10 mg by mouth daily as needed for allergies.   clobetasol ointment 0.05 % Commonly known as: TEMOVATE Apply 1 application  topically 2 (two) times daily as needed (irritation).   diphenhydrAMINE 25 mg capsule Commonly known as: BENADRYL Take 25 mg by mouth every 6 (six) hours as needed for itching or sleep.   empagliflozin 10 MG Tabs tablet Commonly known as: Jardiance Take 1 tablet (10 mg total) by mouth daily before breakfast.   ergocalciferol (VITAMIN D2) 200 MCG/ML drops Commonly known as: DRISDOL Take 6 mLs (48,000 Units total) by mouth 3 (three) times a week.   FLINTSTONES GUMMIES PO Take 4 tablets by mouth in the morning.   furosemide 40 MG tablet Commonly known as: LASIX Take 2 tablets (80 mg total) by mouth daily. What changed:  how much to take when to take this additional instructions   HEPARIN LOCK FLUSH IJ Inject 5 mLs as directed See admin instructions. Monday, Tuesday, Wednesday, Thursday and Friday in the morning with TPN   losartan 25 MG tablet Commonly known as: COZAAR Take 1 tablet (25 mg total) by mouth at bedtime. What changed: how much to take   omeprazole 40 MG capsule Commonly known as: PRILOSEC TAKE 1 CAPSULE BY MOUTH EVERY DAY BEFORE BREAKFAST What changed: See the new instructions.   ondansetron 8 MG disintegrating tablet Commonly known as: ZOFRAN-ODT TAKE 1 TABLET BY MOUTH EVERY 8 HOURS AS NEEDED FOR NAUSEA OR VOMITING. What changed: reasons to take this   potassium chloride SA 20 MEQ tablet Commonly known as: Klor-Con M20 Take 1 tablet (20 mEq total) by mouth daily.   sodium chloride 0.9 % infusion Inject 900 mLs into the vein every 14 (fourteen) days. Uses for TPN   spironolactone 25 MG tablet Commonly known as: ALDACTONE TAKE 1  TABLET (25 MG TOTAL) BY MOUTH DAILY. What changed: when to take this   Teduglutide (rDNA) 5 MG Kit Inject 3.31 Units into the skin every morning.   vitamin E 1000 UNIT capsule Take 1,000 Units by mouth in the morning.   Xarelto 20 MG Tabs tablet Generic drug: rivaroxaban TAKE 1 TABLET BY MOUTH DAILY WITH SUPPER. What changed: how much to take        Follow-up Information     [name], MD Follow up.   Specialty: Family Medicine Contact information: [address] [place] [address] [phone]         [facility] Follow up.   Why: home health RNservices wil be provided by [facility]  Contact: [phone]        Gatha Mayer, MD Follow up.   Specialty: Gastroenterology Contact information: [address] [address] [phone]                Discharge Exam: Filed Weights   01/22/22 0101 01/22/22 1214 01/23/22 0500  Weight: 65.3 kg 65.8 kg 66.1 kg  BP (!) 92/53 (BP Location: Left Arm)   Pulse 66  Temp 98.6 F (37 C) (Oral)   Resp 19   Ht _0  (1.727 m)   Wt 66.1 kg   LMP 02/26/2014   SpO2 96%   BMI 22.17 kg/m   Alert, oriented, interactive, pleasant female in no distress Regular with rate in 60's. II/VI systolic murmurs at base and apex, no JVD or edema.  Clear, nonlabored +BS, soft, NT, ND Oriented without focal deficits No sacral pressure injury or other rashes. Tunneled CVC is nontender without erythema, bleeding or exudate.  Condition at discharge: stable  The results of significant diagnostics from this hospitalization (including imaging, microbiology, ancillary and laboratory) are listed below for reference.   Imaging Studies: Overnight EEG with video  Result Date: 01/23/2022 Lora Havens, MD     01/23/2022  9:49 AM Patient Name: [name] [name] MRN: [id number] Epilepsy Attending: Lora Havens Referring Physician/Provider: Janine Ores, NP Duration: 01/22/2022 1651 to  01/23/2022 0945  Patient history: 71yo F with ams. EEG to evaluate for seizure  Level of alertness: lethargic  AEDs during EEG study: None  Technical aspects: This EEG study was done with scalp electrodes positioned according to the 10-20 International system of electrode placement. Electrical activity was reviewed with band pass filter of 1-_1 , sensitivity of 7 uV/mm, display speed of 25m/sec with a _2  notched filter applied as appropriate. EEG data were recorded continuously and digitally stored.  Video monitoring was available and reviewed as appropriate.  Description: EEG initially showed continuous generalized 3-_3  theta-delta slowing. Gradually eeg improved and showed predominantly 5-_4  theta-alpha activity admixed with 2-_5  delta slowing.  Generalized periodic discharges with triphasic morphology were also noted at 1-2.5 Hz which gradually improved to 1-1._6 , more prominent when awake/stimulated.  Hyperventilation and photic stimulation were not performed.  Patient event button was pressed on 01/22/2022 at 1713 for patient being confused and at 1730 and 1743 or patient crying. Concomitant eeg before, during and after the event didn't show any eeg change to suggest seizure,   ABNORMALITY - Periodic discharges with triphasic morphology, generalized ( GPDs) - Continuous slow, generalized  IMPRESSION: This study showed generalized periodic discharges with triphasic morphology which gradually improved and were most likely due to toxic-metabolic causes like cefepime toxicity.  Additionally there is moderate diffuse encephalopathy. No definite seizures were seen during the study.  Patient event button was pressed on 01/22/2022 at 1713 for patient being confused and at 1730 and 1743 or patient crying without concomitant eeg change. These events were NOT epileptic.  PLora Havens   EEG adult  Result Date: 01/22/2022 YLora Havens MD     01/23/2022  9:41 AM Patient Name: [name] [name] MRN: [id number] Epilepsy Attending: PLora HavensReferring Physician/Provider: SJanine Ores NP Date: 01/22/2022 Duration: 28.49 mins Patient history: 89yoF with ams. EEG to evaluate for seizure Level of alertness: Awake AEDs during EEG study: Ativan Technical aspects: This EEG study was done with scalp electrodes positioned according to the 10-20 International system of electrode placement. Electrical activity was reviewed with band pass filter of 1-_7 , sensitivity of 7 uV/mm, display speed of 344msec with a _8  notched filter applied as appropriate. EEG data were recorded continuously and digitally stored.  Video monitoring was available and reviewed as appropriate. Description: EEG showed continuous generalized 3-_9  theta-delta slowing.  Generalized periodic discharges with triphasic morphology were noted at 1-2.5 Hz.  Hyperventilation and photic stimulation were not performed.    ABNORMALITY - Periodic discharges with triphasic morphology, generalized ( GPDs) -  Continuous slow, generalized  IMPRESSION: This study showed generalized periodic discharges with triphasic morphology. This pattern can be seen due to toxic-metabolic causes like cefepime toxicity.  However, due to the frequency,would recommend LTM monitoring if clinically indicated.   Additionally there is moderate to severe diffuse encephalopathy. No definite seizures were seen during the study.  Lora Havens   CT Angio Chest/Abd/Pel for Dissection W and/or Wo Contrast  Result Date: 01/21/2022 CLINICAL DATA:  Chest and back pain. EXAM: CT ANGIOGRAPHY CHEST, ABDOMEN AND PELVIS TECHNIQUE: Non-contrast CT of the chest was initially obtained. Multidetector CT imaging through the chest, abdomen and pelvis was performed using the standard protocol during bolus administration of intravenous contrast. Multiplanar reconstructed images and MIPs were obtained and reviewed to evaluate the vascular anatomy. RADIATION DOSE REDUCTION: This exam was performed  according to the departmental dose-optimization program which includes automated exposure control, adjustment of the mA and/or kV according to patient size and/or use of iterative reconstruction technique. CONTRAST:  156m OMNIPAQUE IOHEXOL 350 MG/ML SOLN COMPARISON:  CT PE protocol 12/09/2021 FINDINGS: CTA CHEST FINDINGS Cardiovascular: No evidence for aortic dissection or aneurysm. There are mild calcified atherosclerotic plaque throughout the aorta. There is no large central pulmonary embolism. The heart is moderately enlarged. There is no pericardial effusion. Left-sided central venous catheter ends in the right atrium. Mediastinum/Nodes: No enlarged mediastinal, hilar, or axillary lymph nodes. Thyroid gland, trachea, and esophagus demonstrate no significant findings. Lungs/Pleura: There is a ground-glass nodular density in the right upper lobe measuring 7 mm image 11/27. There is minimal atelectasis in the lung bases. The lungs are otherwise clear. No pleural effusion or pneumothorax. Musculoskeletal: No chest wall abnormality. No acute or significant osseous findings. Review of the MIP images confirms the above findings. CTA ABDOMEN AND PELVIS FINDINGS VASCULAR Aorta: Normal caliber aorta without aneurysm, dissection, vasculitis or significant stenosis. There are atherosclerotic calcifications throughout the aorta. Celiac: Patent without evidence of aneurysm, dissection, vasculitis or significant stenosis. SMA: Patent without evidence of aneurysm, dissection, vasculitis or significant stenosis. Renals: Limited evaluation secondary to artifact from patient's arms and respiratory motion artifact. Grossly patent bilaterally. IMA: Not well seen. Inflow: Patent without evidence of aneurysm, dissection, vasculitis or significant stenosis. Veins: No obvious venous abnormality within the limitations of this arterial phase study. Review of the MIP images confirms the above findings. NON-VASCULAR Hepatobiliary: No focal  liver abnormality is seen. Status post cholecystectomy. No biliary dilatation. Pancreas: Unremarkable. No pancreatic ductal dilatation or surrounding inflammatory changes. Spleen: Mildly enlarged. Adrenals/Urinary Tract: Rounded hypodensity in the left kidney is too small to characterize, likely a cyst. Otherwise, the kidneys, adrenal glands and bladder are within normal limits. Stomach/Bowel: The colon is fluid filled. The cecum is midline and mildly dilated. Questionable stricture seen just proximal to the terminal ileum. No wall thickening or surrounding inflammation identified. The appendix is not seen. There is sigmoid colon diverticulosis. Small bowel and stomach are nondilated. No free air. Lymphatic: No enlarged lymph nodes. Reproductive: There is a 4.8 x 4.5 cm left adnexal cystic area. Right adnexa and uterus appear within normal limits. Other: No ascites or focal abdominal wall hernia. Musculoskeletal: No acute or significant osseous findings. Review of the MIP images confirms the above findings. IMPRESSION: 1. No evidence for aortic dissection or aneurysm. 2. Moderate cardiomegaly. 3. No acute cardiopulmonary process. 4. The cecum is dilated and fluid-filled and there is questionable stricture or mass within the colon just proximal to the terminal ileum. Recommend clinical correlation and follow-up. 5. Mild  splenomegaly. 6. Subcentimeter left Bosniak 2 lesion, too small to characterize. No follow-up imaging is recommended. JACR 2018 Feb; 264-273, Management of the Incidental RenalMass on CT, [employer] 2021; 814-848, Bosniak Classification of Cystic Renal Masses, Version 2019. 7. 7 mm right ground-glass pulmonary nodule within the upper lobe. Per Fleischner Society Guidelines, recommend a non-contrast Chest CT at 6-12 months to confirm persistence, then additional non-contrast Chest CTs every 2 years until 5 years. If nodule grows or develops solid component(s), consider resection. These guidelines do  not apply to immunocompromised patients and patients with cancer. Follow up in patients with significant comorbidities as clinically warranted. For lung cancer screening, adhere to Lung-RADS guidelines. Reference: Radiology. 2017; 284(1):228-43. 8. 4.8 cm left adnexal simple-appearing cyst. Recommend follow-up pelvic US in 6-12 months. Reference: JACR 2020 Feb;17(2):248-254 Electronically Signed   By: Ronney Asters M.D.   On: 01/21/2022 17:26   DG Chest Port 1 View  Result Date: 01/21/2022 CLINICAL DATA:  Mental status changes. Possible sepsis. Recent exchange tunneled central venous catheter. EXAM: PORTABLE CHEST 1 VIEW COMPARISON:  12/09/2021 FINDINGS: Stable cardiac enlargement. Left jugular tunneled central line present with the catheter tip at the SVC/RA junction. There is no evidence of pulmonary edema, consolidation, pneumothorax, nodule or pleural fluid. IMPRESSION: Stable cardiac enlargement and positioning of tunneled left jugular central venous catheter. Electronically Signed   By: Aletta Edouard M.D.   On: 01/21/2022 14:16   CT Head Wo Contrast  Result Date: 01/21/2022 CLINICAL DATA:  Mental status changes of unknown cause, confusion EXAM: CT HEAD WITHOUT CONTRAST TECHNIQUE: Contiguous axial images were obtained from the base of the skull through the vertex without intravenous contrast. RADIATION DOSE REDUCTION: This exam was performed according to the departmental dose-optimization program which includes automated exposure control, adjustment of the mA and/or kV according to patient size and/or use of iterative reconstruction technique. COMPARISON:  CT head 07/23/2020 FINDINGS: Brain: Generalized atrophy. Normal ventricular morphology. No midline shift or mass effect. Small vessel chronic ischemic changes of deep cerebral white matter. No intracranial hemorrhage, mass lesion, evidence of acute infarction, or extra-axial fluid collection. Vascular: No hyperdense vessels. Atherosclerotic  calcification of internal carotid and vertebral arteries at skull base Skull: Demineralized but intact Sinuses/Orbits: Clear Other: N/A IMPRESSION: Atrophy with small vessel chronic ischemic changes of deep cerebral white matter. No acute intracranial abnormalities. Electronically Signed   By: Lavonia Dana M.D.   On: 01/21/2022 13:53   ECHO TEE  Result Date: 01/20/2022    TRANSESOPHOGEAL ECHO REPORT   Patient Name:   JALAYNE GANESH Date of Exam: 01/14/2022 Medical Rec #:  [id number]    Height:       68.0 in Accession #:    [id number]   Weight:       141.8 lb Date of Birth:  January 09, 1951   BSA:          1.766 m Patient Age:    4 years     BP:           105/53 mmHg Patient Gender: F            HR:           53 bpm. Exam Location:  Inpatient Procedure: 3D Echo, Transesophageal Echo, Cardiac Doppler, Color Doppler and            Saline Contrast Bubble Study Indications:     I34.0 Nonrheumatic mitral (valve) insufficiency  History:         Patient has prior history of  Echocardiogram examinations, most                  recent 12/16/2021. CHF, Abnormal ECG, Stroke, Mitral Valve                  Disease; Arrythmias:Bradycardia.  Sonographer:     Roseanna Rainbow RDCS Referring Phys:  [place] Diagnosing Phys: Cherlynn Kaiser MD PROCEDURE: After discussion of the risks and benefits of a TEE, an informed consent was obtained from the patient. TEE procedure time was 42 minutes. The transesophogeal probe was passed without difficulty through the esophogus of the patient. Imaged were obtained with the patient in a left lateral decubitus position. Local oropharyngeal anesthetic was provided with Cetacaine. Sedation performed by different physician. The patient was monitored while under deep sedation. Anesthestetic sedation was provided intravenously by Anesthesiology: 342m of Propofol. Image quality was good. The patient's vital signs; including heart rate, blood pressure, and oxygen saturation; remained stable throughout the  procedure. The patient developed no complications during the procedure. IMPRESSIONS  1. Left ventricular ejection fraction, by estimation, is 30 to 35%. The left ventricle has moderately decreased function. The left ventricular internal cavity size was moderately dilated.  2. Right ventricular systolic function is mildly reduced. The right ventricular size is normal.  3. Left atrial size was severely dilated. No left atrial/left atrial appendage thrombus was detected. The LAA emptying velocity was 52 cm/s.  4. Right atrial size was mildly dilated.  5. Mechanism of mitral valve regurgitation is likely posterior leaflet restriction due to regional wall motion abnormality and atrial functional MR. No pulmonary vein systolic reversals in left sided pulmonary veins, however there are probable systolic pulmonary vein reversals in the right sided pulmonary veins. The mitral valve is grossly normal. Moderate to severe mitral valve regurgitation. No evidence of mitral stenosis. Additionally, in some views the posterior mitral annulus appears hypermobile.  6. The aortic valve is tricuspid. There is mild calcification of the aortic valve. Aortic valve regurgitation is moderate. Mild aortic valve stenosis. Aortic valve area, by VTI measures 1.41 cm. Aortic valve mean gradient measures 10.0 mmHg. Aortic valve Vmax measures 2.28 m/s.  7. There is Moderate (Grade III) atheroma plaque involving the aortic arch and descending aorta.  8. Agitated saline contrast bubble study was positive with shunting observed after >6 cardiac cycles suggestive of intrapulmonary shunting.  9. Indwelling catheter tip visualized in right atrium. No evidence of vegetation. FINDINGS  Left Ventricle: Left ventricular ejection fraction, by estimation, is 30 to 35%. The left ventricle has moderately decreased function. The left ventricular internal cavity size was moderately dilated. Right Ventricle: Prominent, likely calcified moderator band. The right  ventricular size is normal. No increase in right ventricular wall thickness. Right ventricular systolic function is mildly reduced. Left Atrium: Left atrial size was severely dilated. No left atrial/left atrial appendage thrombus was detected. The LAA emptying velocity was 52 cm/s. Right Atrium: Right atrial size was mildly dilated. Pericardium: Trivial pericardial effusion is present. Mitral Valve: Mechanism of mitral valve regurgitation is likely posterior leaflet restriction due to regional wall motion abnormality and atrial functional MR. No pulmonary vein systolic reversals in left sided pulmonary veins, however there are probable  systolic pulmonary vein reversals in the right sided pulmonary veins. The mitral valve is grossly normal. Moderate to severe mitral valve regurgitation. No evidence of mitral valve stenosis. MV peak gradient, 3.7 mmHg. The mean mitral valve gradient is 1.0 mmHg with average heart rate of 52 bpm. Tricuspid  Valve: The tricuspid valve is normal in structure. Tricuspid valve regurgitation is trivial. Aortic Valve: The aortic valve is tricuspid. There is mild calcification of the aortic valve. Aortic valve regurgitation is moderate. Aortic regurgitation PHT measures 570 msec. Mild aortic stenosis is present. Aortic valve mean gradient measures 10.0 mmHg. Aortic valve peak gradient measures 20.8 mmHg. Aortic valve area, by VTI measures 1.41 cm. Pulmonic Valve: The pulmonic valve was normal in structure. Pulmonic valve regurgitation is trivial. Aorta: The aortic root and ascending aorta are structurally normal, with no evidence of dilitation. There is moderate (Grade III) atheroma plaque involving the aortic arch and descending aorta. IAS/Shunts: No atrial level shunt detected by color flow Doppler. Agitated saline contrast was given intravenously to evaluate for intracardiac shunting. Agitated saline contrast bubble study was positive with shunting observed after >6 cardiac cycles  suggestive of intrapulmonary shunting.  LEFT VENTRICLE PLAX 2D LVOT diam:     2.10 cm LV SV:         68 LV SV Index:   39 LVOT Area:     3.46 cm  AORTIC VALVE AV Area (Vmax):    1.51 cm AV Area (Vmean):   1.35 cm AV Area (VTI):     1.41 cm AV Vmax:           228.00 cm/s AV Vmean:          142.000 cm/s AV VTI:            0.483 m AV Peak Grad:      20.8 mmHg AV Mean Grad:      10.0 mmHg LVOT Vmax:         99.30 cm/s LVOT Vmean:        55.500 cm/s LVOT VTI:          0.197 m LVOT/AV VTI ratio: 0.41 AI PHT:            570 msec AR Vena Contracta: 0.50 cm  AORTA Ao Root diam: 3.50 cm Ao Asc diam:  3.50 cm MITRAL VALVE MV Area VTI:  1.86 cm       SHUNTS MV Peak grad: 3.7 mmHg       Systemic VTI:  0.20 m MV Mean grad: 1.0 mmHg       Systemic Diam: 2.10 cm MV Vmax:      0.96 m/s MV Vmean:     56.7 cm/s MR Peak grad:   81.4 mmHg MR Mean grad:   47.0 mmHg MR Vmax:        451.00 cm/s MR Vmean:       310.0 cm/s MR PISA:        1.57 cm MR PISA Radius: 0.50 cm Cherlynn Kaiser MD Electronically signed by Cherlynn Kaiser MD Signature Date/Time: 01/20/2022/11:08:19 PM    Final    IR Fluoro Guide CV Line Left  Result Date: 01/14/2022 INDICATION: 71 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 Patient has a chronic left jugular central line and limited central venous access. Patient recently had fevers and chills. Patient presents for tunneled catheter exchange with fluoroscopy. EXAM: Tunneled central venous catheter exchange with fluoroscopy MEDICATIONS: None ANESTHESIA/SEDATION: None FLUOROSCOPY: Radiation Exposure Index (as provided by the fluoroscopic device): 46 mGy Kerma COMPLICATIONS: None immediate. PROCEDURE: Informed consent was obtained for a tunneled central venous catheter exchange with fluoroscopy. Patient was placed supine on the interventional table. The left chest and catheter were prepped and draped in sterile fashion. Maximal barrier sterile technique was utilized including caps, mask,  sterile gowns,  sterile gloves, sterile drape, hand hygiene and skin antiseptic. Skin around the catheter exit site was anesthetized with 1% lidocaine. The cuff was exposed using blunt and sharp dissection. The old catheter tip was in the right atrium. The old catheter was easily removed over a stiff Glidewire. A new single-lumen Powerline catheter was cut to 31 cm. Catheter was easily advanced over the wire and tip positioned in the right atrium. The cuff was placed underneath the skin. The catheter was secured to the skin near the entrance site using Prolene suture. Catheter aspirated and flushed well. Heparinized saline was placed in the catheter and a dressing was placed. Fluoroscopic images were taken and saved for this procedure. FINDINGS: Left jugular central line with vascular stents in the left innominate vein and SVC. Catheter tip in the right atrium. IMPRESSION: Successful exchange of the tunneled central venous catheter with fluoroscopy. Electronically Signed   By: Markus Daft M.D.   On: 01/14/2022 12:59   MR ANGIO HEAD WO CONTRAST  Result Date: 01/12/2022 CLINICAL DATA:  Follow-up examination for stroke, TIA common determine embolic source. EXAM: MRA NECK WITHOUT AND WITH CONTRAST MRA HEAD WITHOUT AND WITH CONTRAST TECHNIQUE: Multiplanar and multiecho pulse sequences of the neck were obtained without and with intravenous contrast. Angiographic images of the neck were obtained using MRA technique without and with intravenous contast.; Angiographic images of the Circle of Willis were obtained using MRA technique without and with intravenous contrast. CONTRAST:  Please see contrast documentation. COMPARISON:  Comparison made with previous brain MRI from 12/20/2021. FINDINGS: MRA NECK FINDINGS AORTIC ARCH: Visualized aortic arch normal in caliber with standard 3 vessel morphology. No hemodynamically significant stenosis seen about the origin of the great vessels. RIGHT CAROTID SYSTEM: Right CCA widely patent without  stenosis. Atheromatous change about the right carotid bulb/proximal right ICA with associated stenosis of up to 50% by NASCET criteria (series 103, image 19). Right ICA patent distally without additional stenosis or evidence for dissection. LEFT CAROTID SYSTEM: Left CCA patent from its origin to the bifurcation without stenosis. Atheromatous irregularity about the left carotid bulb/proximal left ICA with associated mild stenoses of up to 30% by NASCET criteria (series 104, image 19). Left ICA patent distally without additional stenosis or evidence for dissection. VERTEBRAL ARTERIES: Both vertebral arteries arise from the subclavian arteries. Focal approximate 40% stenosis noted at the origin of the right subclavian artery, proximal to the takeoff of the right vertebral artery (series 102, image 6). Proximal left subclavian artery widely patent. Focal mild stenosis noted at the origin of the left vertebral artery (series 102, image 11). Otherwise, vertebral arteries are widely patent without stenosis or evidence for dissection. Left vertebral artery slightly dominant. Other: None. MRA HEAD FINDINGS ANTERIOR CIRCULATION: Visualized distal cervical segments of the internal carotid arteries are mildly tortuous but widely patent with antegrade flow. Petrous segments widely patent bilaterally. Mild atheromatous irregularity within the carotid siphons without hemodynamically significant stenosis. Origins of the ophthalmic arteries normal. A1 segments patent bilaterally. Normal anterior communicating artery complex. Anterior cerebral arteries patent without stenosis. No significant M1 stenosis. 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 Right MCA bifurcation normal. Distal MCA branches well perfused and symmetric. POSTERIOR CIRCULATION: Both vertebral arteries patent without stenosis. Both PICA patent at their origins. Basilar widely patent to  its distal aspect without stenosis. Subtle 2 mm outpouching extending from the left aspect of the mid basilar artery suspicious  for an additional small aneurysm (series 2, image 83). Superior cerebellar arteries patent bilaterally. Both PCAs primarily supplied via the basilar. PCAs are widely patent and well perfused to their distal aspects. Anatomic variants: None significant. IMPRESSION: MRA NECK IMPRESSION: 1. 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 2. Atheromatous irregularity about the left carotid bulb/proximal left ICA with associated stenoses of up to 30% by NASCET criteria. 3. 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 4. Mild stenosis at the origin of the left vertebral artery. Left vertebral artery slightly dominant. MRA HEAD IMPRESSION: 1. Wide patency of the intracranial arterial circulation. No large vessel occlusion. 2. Mild for age atheromatous irregularity about the carotid siphons. No hemodynamically significant or correctable stenosis. 3. 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 4. 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 Electronically Signed   By: Jeannine Boga M.D.   On: 01/12/2022 07:12   MR ANGIO NECK W WO CONTRAST  Result Date: 01/12/2022 CLINICAL DATA:  Follow-up examination for stroke, TIA common determine embolic source. EXAM: MRA NECK WITHOUT AND WITH CONTRAST MRA HEAD WITHOUT AND WITH CONTRAST TECHNIQUE: Multiplanar and multiecho pulse sequences of the neck were obtained without and with intravenous contrast. Angiographic images of the neck were obtained using MRA technique without and with intravenous contast.; Angiographic images of the Circle of Willis were obtained using MRA technique without and with intravenous contrast. CONTRAST:  Please see contrast documentation. COMPARISON:  Comparison made with previous brain MRI from  12/20/2021. FINDINGS: MRA NECK FINDINGS AORTIC ARCH: Visualized aortic arch normal in caliber with standard 3 vessel morphology. No hemodynamically significant stenosis seen about the origin of the great vessels. RIGHT CAROTID SYSTEM: Right CCA widely patent without stenosis. Atheromatous change about the right carotid bulb/proximal right ICA with associated stenosis of up to 50% by NASCET criteria (series 103, image 19). Right ICA patent distally without additional stenosis or evidence for dissection. LEFT CAROTID SYSTEM: Left CCA patent from its origin to the bifurcation without stenosis. Atheromatous irregularity about the left carotid bulb/proximal left ICA with associated mild stenoses of up to 30% by NASCET criteria (series 104, image 19). Left ICA patent distally without additional stenosis or evidence for dissection. VERTEBRAL ARTERIES: Both vertebral arteries arise from the subclavian arteries. Focal approximate 40% stenosis noted at the origin of the right subclavian artery, proximal to the takeoff of the right vertebral artery (series 102, image 6). Proximal left subclavian artery widely patent. Focal mild stenosis noted at the origin of the left vertebral artery (series 102, image 11). Otherwise, vertebral arteries are widely patent without stenosis or evidence for dissection. Left vertebral artery slightly dominant. Other: None. MRA HEAD FINDINGS ANTERIOR CIRCULATION: Visualized distal cervical segments of the internal carotid arteries are mildly tortuous but widely patent with antegrade flow. Petrous segments widely patent bilaterally. Mild atheromatous irregularity within the carotid siphons without hemodynamically significant stenosis. Origins of the ophthalmic arteries normal. A1 segments patent bilaterally. Normal anterior communicating artery complex. Anterior cerebral arteries patent without stenosis. No significant M1 stenosis. 4 mm saccular aneurysm seen extending inferiorly and slightly  medially from the left MCA bifurcation (series 2, image 89). Right MCA bifurcation normal. Distal MCA branches well perfused and symmetric. POSTERIOR CIRCULATION: Both vertebral arteries patent without stenosis. Both PICA patent at their origins. Basilar widely patent to its distal aspect without stenosis. Subtle 2 mm outpouching extending from the  left aspect of the mid basilar artery suspicious for an additional small aneurysm (series 2, image 83). Superior cerebellar arteries patent bilaterally. Both PCAs primarily supplied via the basilar. PCAs are widely patent and well perfused to their distal aspects. Anatomic variants: None significant. IMPRESSION: MRA NECK IMPRESSION: 1. 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 2. Atheromatous irregularity about the left carotid bulb/proximal left ICA with associated stenoses of up to 30% by NASCET criteria. 3. 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 4. Mild stenosis at the origin of the left vertebral artery. Left vertebral artery slightly dominant. MRA HEAD IMPRESSION: 1. Wide patency of the intracranial arterial circulation. No large vessel occlusion. 2. Mild for age atheromatous irregularity about the carotid siphons. No hemodynamically significant or correctable stenosis. 3. 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 4. 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 Electronically Signed   By: Jeannine Boga M.D.   On: 01/12/2022 07:12   CARDIAC CATHETERIZATION  Result Date: 01/01/2022 Findings: RA = 9 RV = 58/10 PA = 56/23 (37) R PCW = 27 (v = 39) L PCW = 25 (v = 31) Fick cardiac output/index = 4.8/2.7 Thermo CO/CI = 4.7/2.7 PVR = 2.10 WU Ao sat = 98% PA sat = 62%, 61% Assessment: 1. Elevated left-sided filling pressures with mild to moderate pulmonary venous HTN and normal output 2. Prominent v-waves in PCWP tracing  suggestive of significant MR Plan/Discussion: Increase diuretics. F/u with Structural Heart team re: [email or web address]. Will need TEE as well. Glori Bickers, MD 12:55 PM   Microbiology: Results for orders placed or performed during the hospital encounter of 01/21/22  Blood Culture (routine x 2)     Status: None (Preliminary result)   Collection Time: 01/21/22  2:07 PM   Specimen: BLOOD  Result Value Ref Range Status   Specimen Description BLOOD BLOOD RIGHT HAND  Final   Special Requests   Final    BOTTLES DRAWN AEROBIC AND ANAEROBIC Blood Culture results may not be optimal due to an inadequate volume of blood received in culture bottles   Culture   Final    NO GROWTH 2 DAYS Performed at [facility], [place] [address] [place], [place] [address]    Report Status PENDING  Incomplete  Resp Panel by RT-PCR (Flu A&B, Covid) Anterior Nasal Swab     Status: None   Collection Time: 01/21/22  2:15 PM   Specimen: Anterior Nasal Swab  Result Value Ref Range Status   SARS Coronavirus 2 by RT PCR NEGATIVE NEGATIVE Final    Comment: (NOTE) SARS-CoV-2 target nucleic acids are NOT DETECTED.  The SARS-CoV-2 RNA is generally detectable in upper respiratory specimens during the acute phase of infection. The lowest concentration of SARS-CoV-2 viral copies this assay can detect is 138 copies/mL. A negative result does not preclude SARS-Cov-2 infection and should not be used as the sole basis for treatment or other patient management decisions. A negative result may occur with  improper specimen collection/handling, submission of specimen other than nasopharyngeal swab, presence of viral mutation(s) within the areas targeted by this assay, and inadequate number of viral copies(<138 copies/mL). A negative result must be combined with clinical observations, patient history, and epidemiological information. The expected result is Negative.  Fact Sheet for Patients:   EntrepreneurPulse.com.au  Fact Sheet for Healthcare Providers:  IncredibleEmployment.be  This test is no t  yet approved or cleared by the [place] and  has been authorized for detection and/or diagnosis of SARS-CoV-2 by FDA under an Emergency Use Authorization (EUA). This EUA will remain  in effect (meaning this test can be used) for the duration of the COVID-19 declaration under Section 564(b)(1) of the Act, 21 U.S.C.section 360bbb-3(b)(1), unless the authorization is terminated  or revoked sooner.       Influenza A by PCR NEGATIVE NEGATIVE Final   Influenza B by PCR NEGATIVE NEGATIVE Final    Comment: (NOTE) The Xpert Xpress SARS-CoV-2/FLU/RSV plus assay is intended as an aid in the diagnosis of influenza from Nasopharyngeal swab specimens and should not be used as a sole basis for treatment. Nasal washings and aspirates are unacceptable for Xpert Xpress SARS-CoV-2/FLU/RSV testing.  Fact Sheet for Patients: EntrepreneurPulse.com.au  Fact Sheet for Healthcare Providers: IncredibleEmployment.be  This test is not yet approved or cleared by the [place] FDA and has been authorized for detection and/or diagnosis of SARS-CoV-2 by FDA under an Emergency Use Authorization (EUA). This EUA will remain in effect (meaning this test can be used) for the duration of the COVID-19 declaration under Section 564(b)(1) of the Act, 21 U.S.C. section 360bbb-3(b)(1), unless the authorization is terminated or revoked.  Performed at [facility], [address] [place], [place] [address]   Urine Culture     Status: None   Collection Time: 01/21/22  3:56 PM   Specimen: Urine, Random  Result Value Ref Range Status   Specimen Description   Final    URINE, RANDOM Performed at [facility] [address] [place], [place] [address]    Special Requests   Final    NONE Performed at [facility], [address] [place], [place] [address]    Culture   Final    NO GROWTH Performed at [facility], [place] [address], [place], [place] [address]    Report Status 01/22/2022 FINAL  Final  Blood Culture (routine x 2)     Status: None (Preliminary result)   Collection Time: 01/21/22  5:38 PM   Specimen: BLOOD RIGHT ARM  Result Value Ref Range Status   Specimen Description   Final    BLOOD RIGHT ARM Performed at [facility], [address] [place], [place] [address]    Special Requests   Final    BOTTLES DRAWN AEROBIC AND ANAEROBIC Blood Culture adequate volume Performed at [facility], [address] [place], [place] [address]    Culture   Final    NO GROWTH 2 DAYS Performed at [facility], [place] [address], [place], [place] [address]    Report Status PENDING  Incomplete   *Note: Due to a large number of results and/or encounters for the requested time period, some results have not been displayed. A complete set of results can be found in Results Review.    Labs: CBC: Recent Labs  Lab 01/16/22 1454 01/21/22 1354 01/22/22 0143 01/23/22 0359  WBC 5.8 5.6 7.4 7.0  NEUTROABS 3.2  --  3.4  --   HGB 10.9* 11.2* 11.7* 11.4*  HCT 33.3* 34.7* 35.6* 34.4*  MCV 91.7 92.3 91.3 91.5  PLT 120* 93* 115* 248*   Basic Metabolic Panel: Recent Labs  Lab 01/16/22 1454 01/21/22 1216 01/21/22 1415 01/22/22 0143 01/23/22 0359  NA 134* 134* 134* 135 137  K 3.5 4.5 4.2 3.9  3.8  CL 112* 102 103 103 104  CO2 14* _0 GLUCOSE 84 76 76 96 300*  BUN 20 27* 26* 22 27*  CREATININE 1.60* 1.18 1.20* 1.26* 1.27*  CALCIUM 8.5* 9.1 8.5* 8.9 8.7*  MG 1.7 2.1  --  2.1 2.2  PHOS 4.2 1.9*  --  3.0 2.4*   Liver Function Tests: Recent Labs  Lab 01/16/22 1454 01/21/22 1216 01/21/22 1415 01/22/22 0143 01/23/22 0359  AST 16 24 32 28 33  ALT _1 ALKPHOS 170* 145* 141* 145* 133*  BILITOT 0.4 0.4 0.6 0.7 0.7  PROT  8.6* 8.5* 8.1 8.5* 8.1  ALBUMIN 3.3* 3.1* 2.6* 2.7* 2.5*   CBG: Recent Labs  Lab 01/21/22 1303 01/22/22 1930 01/23/22 0004 01/23/22 0528  GLUCAP 74 120* 338* 293*    Discharge time spent: greater than 30 minutes.  Signed: Patrecia Pour, MD [facility] 01/23/2022

## 2022-01-23 NOTE — Evaluation (Signed)
Physical Therapy Evaluation Patient Details Name: [name] [name] MRN: [id number] DOB: 01-Apr-1951 Today's Date: 01/23/2022  History of Present Illness  Pt is a 71 y/o female admitted secondary to AMS. 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 PMH includes CKD, CVA, short gut syndrome, CHF, and DVT.  Clinical Impression  Pt admitted secondary to problem above with deficits below. Pt requiring min guard A for mobility tasks this session with HHA. Mild unsteadiness and educated about using RW at home. Pt requesting outpatient PT follow up at d/c and feel this is appropriate given current deficits. Will continue to follow acutely.        Recommendations for follow up therapy are one component of a multi-disciplinary discharge planning process, led by the attending physician.  Recommendations may be updated based on patient status, additional functional criteria and insurance authorization.  Follow Up Recommendations Outpatient PT      Assistance Recommended at Discharge Intermittent Supervision/Assistance  Patient can return home with the following  Help with stairs or ramp for entrance;Assist for transportation;Assistance with cooking/housework    Equipment Recommendations None recommended by PT  Recommendations for Other Services       Functional Status Assessment Patient has had a recent decline in their functional status and demonstrates the ability to make significant improvements in function in a reasonable and predictable amount of time.     Precautions / Restrictions Precautions Precautions: Fall Restrictions Weight Bearing Restrictions: No      Mobility  Bed Mobility Overal bed mobility: Needs Assistance Bed Mobility: Supine to Sit     Supine to sit: Supervision     General bed mobility comments: supervision for safety.    Transfers Overall transfer level: Needs assistance Equipment used: 1 person hand held assist Transfers: Sit to/from Stand Sit to  Stand: Min guard           General transfer comment: Min guard for safety. Mild sway noted upon standing.    Ambulation/Gait Ambulation/Gait assistance: Min guard Gait Distance (Feet): 10 Feet Assistive device: 1 person hand held assist Gait Pattern/deviations: Step-through pattern, Decreased stride length Gait velocity: Decreased     General Gait Details: Pt connected to LT EEG, so mobility limited to EOB. Requiring min guard A for mobility tasks. Reporting mild dizziness, but reports this is baseline since cataract surgery. Educated about using RW at home to increase safety.  Stairs            Wheelchair Mobility    Modified Rankin (Stroke Patients Only)       Balance Overall balance assessment: Needs assistance Sitting-balance support: No upper extremity supported, Feet supported Sitting balance-Leahy Scale: Good     Standing balance support: Single extremity supported Standing balance-Leahy Scale: Fair Standing balance comment: Able to maintain static standing without UE support                             Pertinent Vitals/Pain Pain Assessment Pain Assessment: No/denies pain    Home Living Family/patient expects to be discharged to:: Private residence Living Arrangements: Spouse/significant other Available Help at Discharge: Family Type of Home: House Home Access: Stairs to enter Entrance Stairs-Rails: None Entrance Stairs-Number of Steps: 3   Home Layout: Two level;Able to live on main level with bedroom/bathroom Home Equipment: Shower seat;Grab bars - tub/shower;Cane - single Barista (2 wheels)      Prior Function Prior Level of Function : Needs assist  Mobility Comments: Uses cane occasionally. Husband helps with steps. ADLs Comments: Independent with ADLs.     Hand Dominance        Extremity/Trunk Assessment   Upper Extremity Assessment Upper Extremity Assessment: Generalized weakness     Lower Extremity Assessment Lower Extremity Assessment: Generalized weakness    Cervical / Trunk Assessment Cervical / Trunk Assessment: Normal  Communication   Communication: No difficulties  Cognition Arousal/Alertness: Awake/alert Behavior During Therapy: WFL for tasks assessed/performed Overall Cognitive Status: Impaired/Different from baseline Area of Impairment: Safety/judgement                         Safety/Judgement: Decreased awareness of safety     General Comments: Slightly impulsive and requiring cues to wait for PT.        General Comments      Exercises     Assessment/Plan    PT Assessment Patient needs continued PT services  PT Problem List Decreased strength;Decreased balance;Decreased activity tolerance;Decreased mobility;Decreased knowledge of precautions;Decreased safety awareness       PT Treatment Interventions DME instruction;Stair training;Gait training;Functional mobility training;Therapeutic activities;Therapeutic exercise;Balance training;Patient/family education    PT Goals (Current goals can be found in the Care Plan section)  Acute Rehab PT Goals Patient Stated Goal: to go home PT Goal Formulation: With patient/family Time For Goal Achievement: 02/06/22 Potential to Achieve Goals: Good    Frequency Min 3X/week     Co-evaluation               AM-PAC PT "6 Clicks" Mobility  Outcome Measure Help needed turning from your back to your side while in a flat bed without using bedrails?: None Help needed moving from lying on your back to sitting on the side of a flat bed without using bedrails?: A Little Help needed moving to and from a bed to a chair (including a wheelchair)?: A Little Help needed standing up from a chair using your arms (e.g., wheelchair or bedside chair)?: A Little Help needed to walk in hospital room?: A Little Help needed climbing 3-5 steps with a railing? : A Little 6 Click Score: 19    End of  Session Equipment Utilized During Treatment: Gait belt Activity Tolerance: Patient tolerated treatment well Patient left: in bed;with call bell/phone within reach;with family/visitor present;with nursing/sitter in room (sitting EOB) Nurse Communication: Mobility status PT Visit Diagnosis: Unsteadiness on feet (R26.81);Muscle weakness (generalized) (M62.81)    Time: 3354-5625 PT Time Calculation (min) (ACUTE ONLY): 11 min   Charges:   PT Evaluation $PT Eval Low Complexity: 1 Low          Reuel Derby, PT, DPT  Acute Rehabilitation Services  Office: [phone]   Rudean Hitt 01/23/2022, 10:28 AM

## 2022-01-23 NOTE — Telephone Encounter (Signed)
-----   Message from [name], MD sent at 01/21/2022  4:28 PM EDT ----- Mitral regurgitation with anatomy that appears to be amenable to TEER with MitraClip. OK to schedule procedure if patient would like to proceed.

## 2022-01-23 NOTE — Progress Notes (Addendum)
PHARMACY - TOTAL PARENTERAL NUTRITION CONSULT NOTE   Indication: Short bowel syndrome  Patient Measurements: Height: 5\' 8"  (172.7 cm) Weight: 66.1 kg (145 lb 13 oz) IBW/kg (Calculated) : 63.9 TPN AdjBW (KG): 61.9 Body mass index is 22.17 kg/m. Usual Weight: ~63-66kg  Assessment: 38 YOF presenting for acute metabolic encephalopathy after completing a 7 day course of cefepime for Enterobacter bacteremia. 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 At home, she uses TPN Monday-Friday (5x weekly) for 10 hrs each day (8 hr + 1 hr taper up/down). Home TPN prescription supplied by [facility] and printed document added to shadow chart (Screenshot of prescription shown below). Patient usually eats small portions of food at home but currently with altered mental status and placed on NPO. Pharmacy consulted to initiate TPN for continuation from home while patient is admitted.  Glucose / Insulin: No hx DM; A1C 5.4; CBG 120- 338, used 12 units SSI/24 hr  Electrolytes: CoCa 9.9, Phos 2.4, others wnl Renal: Scr 1.27 (Baseline 1-1.2), BUN 27 Hepatic: Alk phos 133; Albumin 2.5, tbili /LFTs WNL, TG wnl Intake / Output; MIVF: UOP not documented, furosemide 20mg  IV Q12 hr (Received 1 dose yesterday), LBM 9/13 GI Imaging: 9/12 CT abd: cecum is dilated and fluid-filled and there is questionable stricture or mass within the colon just proximal to the terminal ileum. Recommend clinical correlation  GI Surgeries / Procedures:   Central access: Tunneled CVC (last exchange 9/5) TPN start date: Prior to admission  Home TPN Prescription ([facility]): 1804 Kcal per bag      RD Assessment: pending   Last RD assessment 07/02/21: Pt weight was 155 lb at the time, which was higher than usual  Pt was on 4x/week TPN and also ate small portions of food throughout the day  Kcal:  1755-1970 kcal Protein:  90-105 grams Fluid:  >/= 2 L/day   Current Nutrition:   NPO and TPN  Plan:  Decrease volume of cycle goal TPN over 14 hr (rate 61-131 ml/hr, GIR 2.5-5.8 mg/kg/min) provides 82 g AA and 1916 kcal, meeting 100% of needs.  Electrolytes in TPN: Increase K 42 mEq/L and Phos 12 mmol/L; Continue same but adjusted for volume Na 130 mEq/L, Ca 5 mEq/L, Mg 8 mEq/L, Cl:Ac 1:2 Add standard MVI and trace elements to TPN Add chromium given patient is indicated for it even though it is not in home TPN  Increase to moderate q6h SSI and adjust as needed; May need to add insulin to TPN if BG continues to be high  Monitor TPN labs on Mon/Thurs, and as needed   ADDENDUM 10:00 - Discharge orders entered so will NOT make TPN for tonight   Benetta Spar, PharmD, BCPS, BCCP Clinical Pharmacist  Please check AMION for all [place] phone numbers After 10:00 PM, call [place]

## 2022-01-23 NOTE — Progress Notes (Signed)
ANTICOAGULATION CONSULT NOTE - Follow Up Consult  Pharmacy Consult for Heparin > Xarelto Indication:   hx multiple DVTs  Allergies  Allergen Reactions   Cefepime Other (See Comments)    Severe encephalopathy   Ivp Dye [Iodinated Contrast Media] Itching    Can use if taking benadryl     Patient Measurements: Height: '5\' 8"'$  (172.7 cm) Weight: 66.1 kg (145 lb 13 oz) IBW/kg (Calculated) : 63.9 Heparin Dosing Weight: 66 kg  Vital Signs: Temp: 98.6 F (37 C) (09/14 0914) Temp Source: Oral (09/14 0914) BP: 92/53 (09/14 0914) Pulse Rate: 66 (09/14 0914)  Labs: Recent Labs    01/21/22 1354 01/21/22 1407 01/21/22 1415 01/21/22 1600 01/21/22 1738 01/22/22 0143 01/23/22 0359  HGB 11.2*  --   --   --   --  11.7* 11.4*  HCT 34.7*  --   --   --   --  35.6* 34.4*  PLT 93*  --   --   --   --  115* 121*  APTT  --  34  --   --   --   --  45*  LABPROT  --  18.2*  --   --   --   --   --   INR  --  1.5*  --   --   --   --   --   HEPARINUNFRC  --   --   --   --   --   --  0.14*  CREATININE  --   --  1.20*  --   --  1.26* 1.27*  TROPONINIHS  --   --   --  33* 32*  --   --     Estimated Creatinine Clearance: 41 mL/min (A) (by C-G formula based on SCr of 1.27 mg/dL (H)).  Assessment:  71 yr old female on Xarelto 20 mg daily with supper PTA for hx multiple DVTs.  Xarelto and other PO meds held.  Last PTA Xarelto dose 9/11. Transitioned to IV heparin on 9/13 and to change back to Xarelto today.   Heparin level and aPTT were subtherapeutic early this am, and infusion rate increased. Next labs were planned for 2pm.  Chronic thrombocytopenia.  Platelet counts seems to run in 60s-120.  No bleeding reported.  Goal of Therapy:  Heparin levels 0.3-0.7 units/ml Appropriate Xarelto regimen for indication Monitor platelets by anticoagulation protocol: Yes   Plan:  Will resume Xarelto 20 mg daily with supper, but begin with lunch today. Stop IV heparin when giving first Xarelto dose.  Arty Baumgartner, RPh 01/23/2022,9:58 AM

## 2022-01-23 NOTE — Discharge Instructions (Signed)
Information on my medicine - XARELTO (rivaroxaban)  WHY WAS XARELTO PRESCRIBED FOR YOU? Xarelto was prescribed to treat blood clots that may have been found in the veins of your legs (deep vein thrombosis) or in your lungs (pulmonary embolism) and to reduce the risk of them occurring again.  What do you need to know about Xarelto? The current dose is one 20 mg tablet taken ONCE A DAY with your evening meal.  DO NOT stop taking Xarelto without talking to the health care provider who prescribed the medication.  Refill your prescription for 20 mg tablets before you run out.  After discharge, you should have regular check-up appointments with your healthcare provider that is prescribing your Xarelto.  In the future your dose may need to be changed if your kidney function changes by a significant amount.  What do you do if you miss a dose? If you are taking Xarelto TWICE DAILY and you miss a dose, take it as soon as you remember. You may take two 15 mg tablets (total 30 mg) at the same time then resume your regularly scheduled 15 mg twice daily the next day.  If you are taking Xarelto ONCE DAILY and you miss a dose, take it as soon as you remember on the same day then continue your regularly scheduled once daily regimen the next day. Do not take two doses of Xarelto at the same time.   Important Safety Information Xarelto is a blood thinner medicine that can cause bleeding. You should call your healthcare provider right away if you experience any of the following: Bleeding from an injury or your nose that does not stop. Unusual colored urine (red or dark brown) or unusual colored stools (red or black). Unusual bruising for unknown reasons. A serious fall or if you hit your head (even if there is no bleeding).  Some medicines may interact with Xarelto and might increase your risk of bleeding while on Xarelto. To help avoid this, consult your healthcare provider or pharmacist prior to  using any new prescription or non-prescription medications, including herbals, vitamins, non-steroidal anti-inflammatory drugs (NSAIDs) and supplements.  This website has more information on Xarelto: [email or web address]

## 2022-01-23 NOTE — TOC Transition Note (Addendum)
Transition of Care [facility]) - CM/SW Discharge Note   Patient Details  Name: [name] [name] MRN: [id number] Date of Birth: 02-23-1951  [facility] CM/SW Contact:  Sharin Mons, RN Phone Number: 01/23/2022, 11:33 AM   Clinical Narrative:    Patient will DC to: Home Anticipated DC date: 01/23/2022 Family notified:yes Transport by: car   Per MD patient ready for DC today . RN, patient, patient's family, [facility] and [facility] notified of DC. Resumption orders faxed to [facility] with Maish Vaya for TPN, [phone]. Fax confirmation received. Daughter to provide transportation to home . Pt without Rx MED concerns. Post hospital f/u noted on AVS.  RNCM will sign off for now as intervention is no longer needed. Please consult [place] again if new needs arise.    Final next level of care: Home w Home Health Services Barriers to Discharge: No Barriers Identified   Patient Goals and CMS Choice     Choice offered to / list presented to : Spouse, Adult Children  Discharge Placement                       Discharge Plan and Services   Discharge Planning Services: CM Consult            DME Arranged: Other see comment (resumption of TPN with [name])   Date DME Agency Contacted: 01/23/22 Time DME Agency Contacted: [phone] Representative spoke with at DME Agency: [place]: RN [place] Agency: [place] ([place]) Date [place]: 01/22/22 Time [place]: 1038 Representative spoke with at [place]: Caryl Pina  Social Determinants of Health ([place]) Interventions     Readmission Risk Interventions     No data to display

## 2022-01-23 NOTE — Progress Notes (Signed)
[place] for Heparin (Xarelto on hold) Indication: hx multiple DVTs  Allergies  Allergen Reactions   Ivp Dye [Iodinated Contrast Media] Itching    Can use if taking benadryl     Patient Measurements: Height: '5\' 8"'$  (172.7 cm) Weight: 65.8 kg (145 lb 1 oz) IBW/kg (Calculated) : 63.9 Heparin Dosing Weight: 65 kg  Vital Signs: BP: 129/73 (09/13 2013) Pulse Rate: 87 (09/13 2013)  Labs: Recent Labs    01/21/22 1354 01/21/22 1407 01/21/22 1415 01/21/22 1600 01/21/22 1738 01/22/22 0143 01/23/22 0359  HGB 11.2*  --   --   --   --  11.7* 11.4*  HCT 34.7*  --   --   --   --  35.6* 34.4*  PLT 93*  --   --   --   --  115* 121*  APTT  --  34  --   --   --   --  45*  LABPROT  --  18.2*  --   --   --   --   --   INR  --  1.5*  --   --   --   --   --   HEPARINUNFRC  --   --   --   --   --   --  0.14*  CREATININE  --   --  1.20*  --   --  1.26* 1.27*  TROPONINIHS  --   --   --  33* 32*  --   --      Estimated Creatinine Clearance: 41 mL/min (A) (by C-G formula based on SCr of 1.27 mg/dL (H)).   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   Carotid stenosis    Carotid US (9/15):  R 40-59%; L 1-39% >> FU 1 year   Cataract    Closed left subtrochanteric femur fracture (HCC) 12/31/2017   Clotting disorder (HCC)    Congestive heart failure (CHF) (HCC)    EF 25-30%   COVID-19 2022   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   Fever 10/29/2020   Fracture of left clavicle    GERD (gastroesophageal reflux disease)    History of blood transfusion 2013   anemia   Hx of  cardiovascular stress test    Myoview (9/15):  inf-apical scar; no ischemia; EF 47% - low risk    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 12/12/2011   Osteoporosis    Pancytopenia 10/07/2011   Pathologic fracture of neck of femur ([place])    Personal history of colonic polyps    Renal insufficiency    hx of yrs ago   Serratia marcescens infection - bactermia assoc w/ PICC 01/18/2015   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HCC)    Splenomegaly    By ultrasound   Stroke ([place]) 07/2020   Thrombophilia (HCC)    Vitamin D deficiency    Assessment:  71 yr old female on Xarelto 20 mg daily with supper PTA for hx multiple DVTs.  Xarelto and other PO  meds held.  To begin IV heparin. Last Xarelto dose 9/11.  Initial heparin level and aPTT both subtherapeutic - appear to be correlating so will dose via heparin level and stop aPTT checks.  Goal of Therapy:  Heparin level 0.3-0.7 units/ml aPTT 66-102 seconds Monitor platelets by anticoagulation protocol: Yes   Plan:  Increase heparin to 1100 units/h Recheck heparin level in 8h  Arrie Senate, PharmD, [place], [facility] Clinical Pharmacist Please check AMION for all [facility] Pharm[facility] numbers 01/23/2022

## 2022-01-24 ENCOUNTER — Telehealth: Payer: Self-pay | Admitting: Family Medicine

## 2022-01-24 DIAGNOSIS — I6523 Occlusion and stenosis of bilateral carotid arteries: Secondary | ICD-10-CM | POA: Diagnosis not present

## 2022-01-24 DIAGNOSIS — H269 Unspecified cataract: Secondary | ICD-10-CM | POA: Diagnosis not present

## 2022-01-24 DIAGNOSIS — T361X5D Adverse effect of cephalosporins and other beta-lactam antibiotics, subsequent encounter: Secondary | ICD-10-CM | POA: Diagnosis not present

## 2022-01-24 DIAGNOSIS — I255 Ischemic cardiomyopathy: Secondary | ICD-10-CM | POA: Diagnosis not present

## 2022-01-24 DIAGNOSIS — Z9181 History of falling: Secondary | ICD-10-CM | POA: Diagnosis not present

## 2022-01-24 DIAGNOSIS — K2970 Gastritis, unspecified, without bleeding: Secondary | ICD-10-CM | POA: Diagnosis not present

## 2022-01-24 DIAGNOSIS — I5042 Chronic combined systolic (congestive) and diastolic (congestive) heart failure: Secondary | ICD-10-CM | POA: Diagnosis not present

## 2022-01-24 DIAGNOSIS — D696 Thrombocytopenia, unspecified: Secondary | ICD-10-CM | POA: Diagnosis not present

## 2022-01-24 DIAGNOSIS — K219 Gastro-esophageal reflux disease without esophagitis: Secondary | ICD-10-CM | POA: Diagnosis not present

## 2022-01-24 DIAGNOSIS — E7140 Disorder of carnitine metabolism, unspecified: Secondary | ICD-10-CM | POA: Diagnosis not present

## 2022-01-24 DIAGNOSIS — N1831 Chronic kidney disease, stage 3a: Secondary | ICD-10-CM | POA: Diagnosis not present

## 2022-01-24 DIAGNOSIS — D6859 Other primary thrombophilia: Secondary | ICD-10-CM | POA: Diagnosis not present

## 2022-01-24 DIAGNOSIS — D631 Anemia in chronic kidney disease: Secondary | ICD-10-CM | POA: Diagnosis not present

## 2022-01-24 DIAGNOSIS — K912 Postsurgical malabsorption, not elsewhere classified: Secondary | ICD-10-CM | POA: Diagnosis not present

## 2022-01-24 DIAGNOSIS — E46 Unspecified protein-calorie malnutrition: Secondary | ICD-10-CM | POA: Diagnosis not present

## 2022-01-24 DIAGNOSIS — R911 Solitary pulmonary nodule: Secondary | ICD-10-CM | POA: Diagnosis not present

## 2022-01-24 DIAGNOSIS — G9341 Metabolic encephalopathy: Secondary | ICD-10-CM | POA: Diagnosis not present

## 2022-01-24 DIAGNOSIS — G928 Other toxic encephalopathy: Secondary | ICD-10-CM | POA: Diagnosis not present

## 2022-01-24 DIAGNOSIS — I351 Nonrheumatic aortic (valve) insufficiency: Secondary | ICD-10-CM | POA: Diagnosis not present

## 2022-01-24 DIAGNOSIS — G9389 Other specified disorders of brain: Secondary | ICD-10-CM | POA: Diagnosis not present

## 2022-01-24 DIAGNOSIS — M810 Age-related osteoporosis without current pathological fracture: Secondary | ICD-10-CM | POA: Diagnosis not present

## 2022-01-24 DIAGNOSIS — K639 Disease of intestine, unspecified: Secondary | ICD-10-CM | POA: Diagnosis not present

## 2022-01-24 DIAGNOSIS — I340 Nonrheumatic mitral (valve) insufficiency: Secondary | ICD-10-CM | POA: Diagnosis not present

## 2022-01-24 DIAGNOSIS — I447 Left bundle-branch block, unspecified: Secondary | ICD-10-CM | POA: Diagnosis not present

## 2022-01-24 DIAGNOSIS — E559 Vitamin D deficiency, unspecified: Secondary | ICD-10-CM | POA: Diagnosis not present

## 2022-01-24 NOTE — Telephone Encounter (Signed)
Caller name: Sharyn Lull Nurse '@[facility]  Home Health  On DPR? :yes/no: No  Call back number: [phone]  Provider they see: Birdie Riddle  Reason for call:  calling to get verbal orders: skilled nursing 1 x wk for 9 wks for education and management; has secure VM, so ok to LM

## 2022-01-26 LAB — CULTURE, BLOOD (ROUTINE X 2)
Culture: NO GROWTH
Culture: NO GROWTH
Special Requests: ADEQUATE

## 2022-01-27 NOTE — Telephone Encounter (Signed)
Left VM for Sharyn Lull to call me back

## 2022-01-27 NOTE — Telephone Encounter (Signed)
[place] for verbal orders to proceed

## 2022-01-27 NOTE — Telephone Encounter (Signed)
Is it ok to give verbal orders 

## 2022-01-28 LAB — VITAMIN B1: Vitamin B1 (Thiamine): 138.4 nmol/L (ref 66.5–200.0)

## 2022-01-28 NOTE — Telephone Encounter (Signed)
Spoke with both the patient and her daughter. Ms. Cardella states she feels great and is ready to get her valve fixed. Scheduled her for pre-procedure visit with Dr. Burt Knack 9/28. She understands plan for PAT 10/3 and MitraClip 10/5 is tentative and that she will get instructions for those visits at appointment with Dr. Burt Knack. She was grateful for call and agrees with plan.

## 2022-01-29 DIAGNOSIS — G9341 Metabolic encephalopathy: Secondary | ICD-10-CM | POA: Diagnosis not present

## 2022-01-29 DIAGNOSIS — G928 Other toxic encephalopathy: Secondary | ICD-10-CM | POA: Diagnosis not present

## 2022-01-29 DIAGNOSIS — K912 Postsurgical malabsorption, not elsewhere classified: Secondary | ICD-10-CM | POA: Diagnosis not present

## 2022-01-29 DIAGNOSIS — T361X5D Adverse effect of cephalosporins and other beta-lactam antibiotics, subsequent encounter: Secondary | ICD-10-CM | POA: Diagnosis not present

## 2022-01-29 DIAGNOSIS — K639 Disease of intestine, unspecified: Secondary | ICD-10-CM | POA: Diagnosis not present

## 2022-01-29 DIAGNOSIS — I5042 Chronic combined systolic (congestive) and diastolic (congestive) heart failure: Secondary | ICD-10-CM | POA: Diagnosis not present

## 2022-01-31 DIAGNOSIS — K639 Disease of intestine, unspecified: Secondary | ICD-10-CM | POA: Diagnosis not present

## 2022-01-31 DIAGNOSIS — T361X5D Adverse effect of cephalosporins and other beta-lactam antibiotics, subsequent encounter: Secondary | ICD-10-CM | POA: Diagnosis not present

## 2022-01-31 DIAGNOSIS — G928 Other toxic encephalopathy: Secondary | ICD-10-CM | POA: Diagnosis not present

## 2022-01-31 DIAGNOSIS — I5042 Chronic combined systolic (congestive) and diastolic (congestive) heart failure: Secondary | ICD-10-CM | POA: Diagnosis not present

## 2022-01-31 DIAGNOSIS — K912 Postsurgical malabsorption, not elsewhere classified: Secondary | ICD-10-CM | POA: Diagnosis not present

## 2022-01-31 DIAGNOSIS — G9341 Metabolic encephalopathy: Secondary | ICD-10-CM | POA: Diagnosis not present

## 2022-02-04 ENCOUNTER — Other Ambulatory Visit: Payer: Self-pay

## 2022-02-04 ENCOUNTER — Encounter: Payer: Self-pay | Admitting: Internal Medicine

## 2022-02-04 ENCOUNTER — Observation Stay (HOSPITAL_COMMUNITY)
Admission: EM | Admit: 2022-02-04 | Discharge: 2022-02-05 | Disposition: A | Discharge status: Home / Self Care | Payer: Medicare Other | Attending: Internal Medicine | Admitting: Internal Medicine

## 2022-02-04 ENCOUNTER — Encounter (HOSPITAL_COMMUNITY): Payer: Self-pay

## 2022-02-04 ENCOUNTER — Emergency Department (HOSPITAL_COMMUNITY): Payer: Medicare Other

## 2022-02-04 DIAGNOSIS — F39 Unspecified mood [affective] disorder: Secondary | ICD-10-CM | POA: Diagnosis present

## 2022-02-04 DIAGNOSIS — J9601 Acute respiratory failure with hypoxia: Secondary | ICD-10-CM | POA: Diagnosis present

## 2022-02-04 DIAGNOSIS — N1831 Chronic kidney disease, stage 3a: Secondary | ICD-10-CM | POA: Diagnosis not present

## 2022-02-04 DIAGNOSIS — T485X4A Poisoning by other anti-common-cold drugs, undetermined, initial encounter: Secondary | ICD-10-CM | POA: Diagnosis not present

## 2022-02-04 DIAGNOSIS — Z79899 Other long term (current) drug therapy: Secondary | ICD-10-CM | POA: Diagnosis not present

## 2022-02-04 DIAGNOSIS — Z7984 Long term (current) use of oral hypoglycemic drugs: Secondary | ICD-10-CM | POA: Insufficient documentation

## 2022-02-04 DIAGNOSIS — I340 Nonrheumatic mitral (valve) insufficiency: Secondary | ICD-10-CM | POA: Diagnosis not present

## 2022-02-04 DIAGNOSIS — Z7901 Long term (current) use of anticoagulants: Secondary | ICD-10-CM | POA: Insufficient documentation

## 2022-02-04 DIAGNOSIS — Z8719 Personal history of other diseases of the digestive system: Secondary | ICD-10-CM | POA: Diagnosis not present

## 2022-02-04 DIAGNOSIS — N179 Acute kidney failure, unspecified: Secondary | ICD-10-CM | POA: Diagnosis not present

## 2022-02-04 DIAGNOSIS — I5023 Acute on chronic systolic (congestive) heart failure: Secondary | ICD-10-CM

## 2022-02-04 DIAGNOSIS — K912 Postsurgical malabsorption, not elsewhere classified: Secondary | ICD-10-CM | POA: Diagnosis not present

## 2022-02-04 DIAGNOSIS — I959 Hypotension, unspecified: Secondary | ICD-10-CM | POA: Diagnosis not present

## 2022-02-04 DIAGNOSIS — K219 Gastro-esophageal reflux disease without esophagitis: Secondary | ICD-10-CM | POA: Diagnosis present

## 2022-02-04 DIAGNOSIS — Z8616 Personal history of COVID-19: Secondary | ICD-10-CM | POA: Insufficient documentation

## 2022-02-04 DIAGNOSIS — T450X2A Poisoning by antiallergic and antiemetic drugs, intentional self-harm, initial encounter: Principal | ICD-10-CM | POA: Insufficient documentation

## 2022-02-04 DIAGNOSIS — J810 Acute pulmonary edema: Secondary | ICD-10-CM

## 2022-02-04 DIAGNOSIS — Z86718 Personal history of other venous thrombosis and embolism: Secondary | ICD-10-CM | POA: Diagnosis not present

## 2022-02-04 DIAGNOSIS — D72829 Elevated white blood cell count, unspecified: Secondary | ICD-10-CM | POA: Insufficient documentation

## 2022-02-04 DIAGNOSIS — Z8673 Personal history of transient ischemic attack (TIA), and cerebral infarction without residual deficits: Secondary | ICD-10-CM

## 2022-02-04 DIAGNOSIS — R0902 Hypoxemia: Secondary | ICD-10-CM | POA: Diagnosis not present

## 2022-02-04 DIAGNOSIS — D6859 Other primary thrombophilia: Secondary | ICD-10-CM | POA: Diagnosis present

## 2022-02-04 DIAGNOSIS — T50902A Poisoning by unspecified drugs, medicaments and biological substances, intentional self-harm, initial encounter: Principal | ICD-10-CM

## 2022-02-04 DIAGNOSIS — T50901A Poisoning by unspecified drugs, medicaments and biological substances, accidental (unintentional), initial encounter: Secondary | ICD-10-CM | POA: Diagnosis present

## 2022-02-04 DIAGNOSIS — I6529 Occlusion and stenosis of unspecified carotid artery: Secondary | ICD-10-CM | POA: Diagnosis present

## 2022-02-04 DIAGNOSIS — K90829 Short bowel syndrome, unspecified: Secondary | ICD-10-CM | POA: Diagnosis present

## 2022-02-04 DIAGNOSIS — I517 Cardiomegaly: Secondary | ICD-10-CM | POA: Diagnosis not present

## 2022-02-04 DIAGNOSIS — I10 Essential (primary) hypertension: Secondary | ICD-10-CM | POA: Diagnosis not present

## 2022-02-04 DIAGNOSIS — I700 Atherosclerosis of aorta: Secondary | ICD-10-CM | POA: Diagnosis not present

## 2022-02-04 DIAGNOSIS — R918 Other nonspecific abnormal finding of lung field: Secondary | ICD-10-CM | POA: Diagnosis not present

## 2022-02-04 DIAGNOSIS — T485X1A Poisoning by other anti-common-cold drugs, accidental (unintentional), initial encounter: Secondary | ICD-10-CM | POA: Diagnosis present

## 2022-02-04 LAB — URINALYSIS, ROUTINE W REFLEX MICROSCOPIC
Bacteria, UA: NONE SEEN
Bilirubin Urine: NEGATIVE
Glucose, UA: NEGATIVE mg/dL
Ketones, ur: NEGATIVE mg/dL
Leukocytes,Ua: NEGATIVE
Nitrite: NEGATIVE
Protein, ur: NEGATIVE mg/dL
Specific Gravity, Urine: 1.006 (ref 1.005–1.030)
pH: 5 (ref 5.0–8.0)

## 2022-02-04 LAB — I-STAT CHEM 8, ED
BUN: 26 mg/dL — ABNORMAL HIGH (ref 8–23)
Calcium, Ion: 1.05 mmol/L — ABNORMAL LOW (ref 1.15–1.40)
Chloride: 105 mmol/L (ref 98–111)
Creatinine, Ser: 1.6 mg/dL — ABNORMAL HIGH (ref 0.44–1.00)
Glucose, Bld: 237 mg/dL — ABNORMAL HIGH (ref 70–99)
HCT: 42 % (ref 36.0–46.0)
Hemoglobin: 14.3 g/dL (ref 12.0–15.0)
Potassium: 4.6 mmol/L (ref 3.5–5.1)
Sodium: 136 mmol/L (ref 135–145)
TCO2: 19 mmol/L — ABNORMAL LOW (ref 22–32)

## 2022-02-04 LAB — COMPREHENSIVE METABOLIC PANEL
ALT: 28 U/L (ref 0–44)
AST: 30 U/L (ref 15–41)
Albumin: 3 g/dL — ABNORMAL LOW (ref 3.5–5.0)
Alkaline Phosphatase: 180 U/L — ABNORMAL HIGH (ref 38–126)
Anion gap: 10 (ref 5–15)
BUN: 24 mg/dL — ABNORMAL HIGH (ref 8–23)
CO2: 17 mmol/L — ABNORMAL LOW (ref 22–32)
Calcium: 8.4 mg/dL — ABNORMAL LOW (ref 8.9–10.3)
Chloride: 106 mmol/L (ref 98–111)
Creatinine, Ser: 1.63 mg/dL — ABNORMAL HIGH (ref 0.44–1.00)
GFR, Estimated: 34 mL/min — ABNORMAL LOW (ref >60)
Glucose, Bld: 218 mg/dL — ABNORMAL HIGH (ref 70–99)
Potassium: 4.3 mmol/L (ref 3.5–5.1)
Sodium: 133 mmol/L — ABNORMAL LOW (ref 135–145)
Total Bilirubin: 0.5 mg/dL (ref 0.3–1.2)
Total Protein: 9.5 g/dL — ABNORMAL HIGH (ref 6.5–8.1)

## 2022-02-04 LAB — CBC WITH DIFFERENTIAL/PLATELET
Abs Immature Granulocytes: 0.03 10*3/uL (ref 0.00–0.07)
Basophils Absolute: 0.1 10*3/uL (ref 0.0–0.1)
Basophils Relative: 1 %
Eosinophils Absolute: 0.6 10*3/uL — ABNORMAL HIGH (ref 0.0–0.5)
Eosinophils Relative: 5 %
HCT: 40.4 % (ref 36.0–46.0)
Hemoglobin: 12.4 g/dL (ref 12.0–15.0)
Immature Granulocytes: 0 %
Lymphocytes Relative: 41 %
Lymphs Abs: 5 10*3/uL — ABNORMAL HIGH (ref 0.7–4.0)
MCH: 30.2 pg (ref 26.0–34.0)
MCHC: 30.7 g/dL (ref 30.0–36.0)
MCV: 98.3 fL (ref 80.0–100.0)
Monocytes Absolute: 0.8 10*3/uL (ref 0.1–1.0)
Monocytes Relative: 6 %
Neutro Abs: 5.7 10*3/uL (ref 1.7–7.7)
Neutrophils Relative %: 47 %
Platelets: 121 10*3/uL — ABNORMAL LOW (ref 150–400)
RBC: 4.11 MIL/uL (ref 3.87–5.11)
RDW: 17.4 % — ABNORMAL HIGH (ref 11.5–15.5)
WBC: 12.2 10*3/uL — ABNORMAL HIGH (ref 4.0–10.5)
nRBC: 0 % (ref 0.0–0.2)

## 2022-02-04 LAB — BLOOD GAS, ARTERIAL
Acid-base deficit: 12.3 mmol/L — ABNORMAL HIGH (ref 0.0–2.0)
Bicarbonate: 14.2 mmol/L — ABNORMAL LOW (ref 20.0–28.0)
Drawn by: 54496
O2 Saturation: 94.3 %
Patient temperature: 36.4
pCO2 arterial: 33 mmHg (ref 32–48)
pH, Arterial: 7.24 — ABNORMAL LOW (ref 7.35–7.45)
pO2, Arterial: 74 mmHg — ABNORMAL LOW (ref 83–108)

## 2022-02-04 LAB — MAGNESIUM: Magnesium: 1.9 mg/dL (ref 1.7–2.4)

## 2022-02-04 LAB — ETHANOL: Alcohol, Ethyl (B): <10 mg/dL (ref <10)

## 2022-02-04 LAB — BRAIN NATRIURETIC PEPTIDE: B Natriuretic Peptide: 2714.6 pg/mL — ABNORMAL HIGH (ref 0.0–100.0)

## 2022-02-04 LAB — ACETAMINOPHEN LEVEL: Acetaminophen (Tylenol), Serum: <10 ug/mL — ABNORMAL LOW (ref 10–30)

## 2022-02-04 LAB — LACTIC ACID, PLASMA: Lactic Acid, Venous: 5.2 mmol/L (ref 0.5–1.9)

## 2022-02-04 LAB — SALICYLATE LEVEL: Salicylate Lvl: <7 mg/dL — ABNORMAL LOW (ref 7.0–30.0)

## 2022-02-04 MED ORDER — ACETAMINOPHEN 325 MG PO TABS: 650.0000 mg | ORAL_TABLET | Freq: Four times a day (QID) | ORAL | Status: DC | PRN

## 2022-02-04 MED ORDER — FUROSEMIDE 10 MG/ML IJ SOLN
20.0000 mg | Freq: Once | INTRAMUSCULAR | Status: AC
  Administered 2022-02-04: 20 mg via INTRAVENOUS
  Filled 2022-02-04: qty 4

## 2022-02-04 MED ORDER — TEDUGLUTIDE (RDNA) 5 MG ~~LOC~~ KIT: 3.3100 [IU] | PACK | Freq: Every morning | SUBCUTANEOUS | Status: DC

## 2022-02-04 MED ORDER — ACETAMINOPHEN 650 MG RE SUPP: 650.0000 mg | Freq: Four times a day (QID) | RECTAL | Status: DC | PRN

## 2022-02-04 MED ORDER — IPRATROPIUM-ALBUTEROL 0.5-2.5 (3) MG/3ML IN SOLN
3.0000 mL | Freq: Once | RESPIRATORY_TRACT | Status: AC
  Administered 2022-02-04: 3 mL via RESPIRATORY_TRACT
  Filled 2022-02-04: qty 3

## 2022-02-04 MED ORDER — FLINTSTONES GUMMIES PO CHEW: CHEWABLE_TABLET | Freq: Every morning | ORAL | Status: DC

## 2022-02-04 MED ORDER — POLYETHYLENE GLYCOL 3350 17 G PO PACK: 17.0000 g | PACK | Freq: Every day | ORAL | Status: DC | PRN

## 2022-02-04 MED ORDER — PANTOPRAZOLE SODIUM 40 MG PO TBEC
40.0000 mg | DELAYED_RELEASE_TABLET | Freq: Every day | ORAL | Status: DC
  Administered 2022-02-05: 40 mg via ORAL
  Filled 2022-02-04: qty 1

## 2022-02-04 MED ORDER — FUROSEMIDE 10 MG/ML IJ SOLN
40.0000 mg | Freq: Once | INTRAMUSCULAR | Status: AC
  Administered 2022-02-05: 40 mg via INTRAVENOUS
  Filled 2022-02-04: qty 4

## 2022-02-04 MED ORDER — RIVAROXABAN 20 MG PO TABS
20.0000 mg | ORAL_TABLET | Freq: Every day | ORAL | Status: DC
  Administered 2022-02-05: 20 mg via ORAL
  Filled 2022-02-04: qty 1

## 2022-02-04 MED ORDER — SODIUM CHLORIDE 0.9% FLUSH
3.0000 mL | Freq: Two times a day (BID) | INTRAVENOUS | Status: DC
  Administered 2022-02-05: 3 mL via INTRAVENOUS

## 2022-02-04 MED ORDER — ADULT MULTIVITAMIN LIQUID CH
15.0000 mL | Freq: Every day | ORAL | Status: DC
  Administered 2022-02-05: 15 mL via ORAL
  Filled 2022-02-04: qty 15

## 2022-02-04 MED ORDER — FUROSEMIDE 10 MG/ML IJ SOLN: 80.0000 mg | Freq: Once | INTRAMUSCULAR | Status: DC

## 2022-02-04 MED ORDER — ERGOCALCIFEROL 200 MCG/ML PO SOLN: 48000.0000 [IU] | ORAL | Status: DC

## 2022-02-04 MED ORDER — SPIRONOLACTONE 25 MG PO TABS
25.0000 mg | ORAL_TABLET | Freq: Every day | ORAL | Status: DC
  Administered 2022-02-05: 25 mg via ORAL
  Filled 2022-02-04: qty 1

## 2022-02-04 MED ORDER — EMPAGLIFLOZIN 10 MG PO TABS
10.0000 mg | ORAL_TABLET | Freq: Every day | ORAL | Status: DC
  Administered 2022-02-05: 10 mg via ORAL
  Filled 2022-02-04: qty 1

## 2022-02-04 MED ORDER — VITAMIN E 45 MG (100 UNIT) PO CAPS
1000.0000 [IU] | ORAL_CAPSULE | Freq: Every day | ORAL | Status: DC
  Administered 2022-02-05: 1000 [IU] via ORAL
  Filled 2022-02-04: qty 10

## 2022-02-04 MED ORDER — LOSARTAN POTASSIUM 25 MG PO TABS: 12.5000 mg | ORAL_TABLET | Freq: Every day | ORAL | Status: DC

## 2022-02-04 NOTE — ED Notes (Signed)
Daughter expressed to this RN that she doesn't want her mother to be stuck for blood anymore, EDP notified.

## 2022-02-04 NOTE — ED Triage Notes (Addendum)
Per EMS- Patient got into an argument with her husband and took approx 10 tabs Benadryl. Patient told EMS that she also took 3 packs of Activated Charcoal. Patient told EMS that she was frustrated and took the Benadryl. patient also had TPN infusing into her port and discontinued it.  Patient c/o SOB and anxiety. Patient denies N/V. EMS called Poison Control

## 2022-02-04 NOTE — H&P (Addendum)
History and Physical   [name] [name] BSW:[id number] DOB: 09-05-1950 DOA: 02/04/2022  PCP: [name], MD   Patient coming from: Home  Chief Complaint: Overdose  HPI: [name] [name] is a 71 y.o. female with medical history significant of short bowel syndrome, GERD, anemia, CKD 3A, CHF, mitral regurgitation, carotid artery disease, atrial fibrillation, history of mesenteric infarction, DVT, hypercoagulability, nonalcoholic fatty liver presenting following overdose at home.  Patient had argument with her significant other related to her chronic medical conditions of being dependent on others.  Follows argument she "felt over it "and over her chronic medical problems.  She then took 10-15 over-the-counter Benadryl's.  She also reportedly disconnected herself from her TPN.  She then took 3 packets of activated charcoal which she had in the house to counteract it.  Daughter reports some increasing depression at home.  No prior suicide attempts.  Patient reports she feels like she has had more depression and anxiety and reacting stronger to arguments since her episode of cefepime toxicity.  In route EMS called poison control and gave patient a 200 cc bolus.  Poison control in the ED recommended EKG and repeat Tylenol level with no need for further activated charcoal for sure she took it.  While in the ED patient developed acute respiratory failure as below.  She currently denies fevers, chills, chest pain, abdominal pain, constipation, diarrhea, nausea, vomiting.  ED Course: Vital signs in the ED significant for heart rate in the 90s and then briefly in the 130s and now improved back to the 90s again.  Respiratory rate in the 20s to 30s.  Initially oxygenating on room air then requiring BiPAP now weaned to 10 L.  Lab work-up showed CMP with sodium 133, BUN 24, creatinine elevated 1.62 from baseline 1.2, glucose 218, calcium 8.4, protein 8.5, albumin 3.0, alk phos 180.  CBC with leukocytosis  12.2, platelets stable 121.  BNP elevated to greater than 2700, lactic acid 5.2.  Aspirin, Tylenol, ethanol level normal.  UDS and urinalysis pending.  ABG with pH of 7.24 and PCO2 of 33.  Imaging work-up included chest x-ray showing hazy airspace disease and interstitial opacities favoring edema.  As above, patient developed increasing shortness of breath and subsequent acute respiratory failure while in the ED and was coughing up pink foamy sputum.  Patient placed on BiPAP and given Lasix with improvement now on 10 L.  She also received a DuoNeb in the ED.  She presumably went into flash pulmonary edema following IV fluid administration in route in the setting of advanced systolic heart failure with moderate to severe mitral regurgitation.  Review of Systems: As per HPI otherwise all other systems reviewed and are negative.  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   Carnitine deficiency ([place]) 05/25/2018   Carotid stenosis    Carotid US (9/15):  R 40-59%; L 1-39% >> FU 1 year   Cataract    Closed left subtrochanteric femur fracture ([place]) 12/31/2017   Clotting disorder ([place])    Congestive heart failure (CHF) ([place])    EF 25-30%   COVID-19 2022   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  07/31/2017   Fever 10/29/2020   Fracture of left clavicle    GERD (gastroesophageal reflux disease)    History of blood transfusion 2013   anemia   Hx of cardiovascular stress test    Myoview (9/15):  inf-apical scar; no ischemia; EF 47% - low risk    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 12/12/2011   Osteoporosis    Pancytopenia 10/07/2011   Pathologic fracture of neck of femur ([place])    Personal history of  colonic polyps    Renal insufficiency    hx of yrs ago   Serratia marcescens infection - bactermia assoc w/ PICC 01/18/2015   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[place])    Splenomegaly    By ultrasound   Stroke (cerebrum) ([place]) 07/25/2020   Stroke ([place]) 07/2020   Thrombophilia ([place])    Vitamin D deficiency     Past Surgical History:  Procedure Laterality Date   APPENDECTOMY  yrs ago   BUBBLE STUDY  01/14/2022   Procedure: BUBBLE STUDY;  Surgeon: [name], MD;  Location: [place];  Service: Cardiology;;   CHOLECYSTECTOMY  yrs ago   COLONOSCOPY  12/05/2005   internal hemorrhoids (for polyp surveillance)   COLONOSCOPY  04/26/2012   Procedure: COLONOSCOPY;  Surgeon: Gatha Mayer, MD;  [facility];  Service: Endoscopy;  Laterality: N/A;   COLONOSCOPY WITH PROPOFOL N/A 03/18/2016   Procedure: COLONOSCOPY WITH PROPOFOL;  Surgeon: Gatha Mayer, MD;  [facility];  Service: Endoscopy;  Laterality: N/A;   ESOPHAGOGASTRODUODENOSCOPY  01/22/2009   erosive esophagitis   HARDWARE REMOVAL Left 12/31/2017   Procedure: HARDWARE REMOVAL;  Surgeon: Rod Can, MD;  [facility]: [facility];  Service: Orthopedics;  Laterality: Left;   INTRAMEDULLARY (IM) NAIL INTERTROCHANTERIC Left 12/31/2017   Procedure: INTRAMEDULLARY (IM) NAIL SUBTROCHANTRIC;  Surgeon: Rod Can, MD;  [facility];  Service: Orthopedics;  Laterality: Left;   IR CV LINE INJECTION  03/23/2018   IR CV LINE INJECTION  10/02/2021   IR FLUORO GUIDE CV LINE LEFT  01/01/2018   IR FLUORO GUIDE CV LINE LEFT  03/24/2018   IR FLUORO GUIDE CV LINE LEFT  05/21/2018   IR FLUORO GUIDE CV LINE LEFT  07/09/2018   IR FLUORO GUIDE CV LINE LEFT  12/28/2020   IR FLUORO GUIDE CV LINE LEFT  07/02/2021   IR FLUORO GUIDE CV LINE LEFT  01/14/2022   IR FLUORO GUIDE CV LINE RIGHT  11/01/2020   IR GENERIC HISTORICAL  07/04/2016   IR REMOVAL TUN CV CATH W/O FL 07/04/2016 Ascencion Dike, PA-C  [facility]   IR GENERIC HISTORICAL  07/10/2016   IR US GUIDE VASC ACCESS LEFT 07/10/2016 Arne Cleveland, MD [facility]   IR GENERIC HISTORICAL  07/10/2016   IR FLUORO GUIDE CV LINE LEFT 07/10/2016 Arne Cleveland, MD [facility]   IR PATIENT EVAL TECH 0-60 MINS  10/07/2017   IR PATIENT EVAL TECH 0-60 MINS  03/09/2018   IR RADIOLOGIST EVAL & MGMT  07/21/2017   IR RADIOLOGIST EVAL & MGMT  11/14/2020   IR RADIOLOGIST EVAL & MGMT  11/27/2020   IR RADIOLOGIST EVAL & MGMT  01/24/2021   IR REMOVAL TUN CV CATH W/O FL  12/28/2020   IR TRANSCATH PLC STENT  INITIAL VEIN  INC ANGIOPLASTY  12/28/2020   IR US GUIDE VASC ACCESS LEFT  11/01/2020   IR US GUIDE VASC ACCESS LEFT  12/28/2020   IR US  GUIDE VASC ACCESS LEFT  12/28/2020   IR US GUIDE VASC ACCESS LEFT  12/28/2020   IR US GUIDE VASC ACCESS RIGHT  11/01/2020   IR VENO/EXT/UNI LEFT  11/01/2020   IR VENOCAVAGRAM SVC  12/28/2020   LEFT HEART CATH AND CORONARY ANGIOGRAPHY N/A 06/26/2020   Procedure: LEFT HEART CATH AND CORONARY ANGIOGRAPHY;  Surgeon: Jolaine Artist, MD;  Location: [place] [facility];  Service: Cardiovascular;  Laterality: N/A;   ORIF PROXIMAL FEMORAL FRACTURE W/ ITST NAIL SYSTEM  03/2007   left, Dr. Shellia Carwin   RIGHT HEART CATH N/A 01/01/2022   Procedure: RIGHT HEART CATH;  Surgeon: Jolaine Artist, MD;  Location: [facility];  Service: Cardiovascular;  Laterality: N/A;   SMALL INTESTINE SURGERY  2005   multiple with right colon resection for ischemia/infarct   TEE WITHOUT CARDIOVERSION N/A 01/14/2022   Procedure: TRANSESOPHAGEAL ECHOCARDIOGRAM (TEE);  Surgeon: [name], MD;  Location: [facility];  Service: Cardiology;  Laterality: N/A;    Social History  reports that she has never smoked. She has never used smokeless tobacco. She reports that she does not drink alcohol and does not use drugs.  Allergies  Allergen Reactions   Cefepime Other (See Comments)    Severe encephalopathy   Ivp Dye [Iodinated Contrast Media]  Itching    Can use if taking benadryl     Family History  Problem Relation Age of Onset   Diabetes Mother    Hypertension Mother    AAA (abdominal aortic aneurysm) Mother    Colon cancer Neg Hx    Stomach cancer Neg Hx   Reviewed on admission  Prior to Admission medications   Medication Sig Start Date End Date Taking? Authorizing Provider  acetaminophen (TYLENOL) 500 MG tablet Take 1,000 mg by mouth every 6 (six) hours as needed for mild pain.   Yes [provider]  ADULT TPN Inject 1,800 mLs into the vein See admin instructions. Pt receives home TPN from [facility]:  1800 mL bag, four nights weekly (Monday, Tuesday, Wednesday, Thursday and Friday for 8 hours (includes 1 hour taper up and down).   Yes [provider]  Calcium Carb-Cholecalciferol (CALCIUM + D3 PO) Take 1 tablet by mouth in the morning and at bedtime.   Yes [provider]  cetirizine (ZYRTEC) 10 MG tablet Take 10 mg by mouth daily as needed for allergies.   Yes [provider]  clobetasol ointment (TEMOVATE) 0.32 % Apply 1 application  topically 2 (two) times daily as needed (irritation). 06/14/21  Yes [provider]  diphenhydrAMINE (BENADRYL) 25 mg capsule Take 25 mg by mouth every 6 (six) hours as needed for itching or sleep.   Yes [provider]  empagliflozin (JARDIANCE) 10 MG TABS tablet Take 1 tablet (10 mg total) by mouth daily before breakfast. 06/11/21  Yes Bensimhon, Shaune Pascal, MD  ergocalciferol (DRISDOL) 200 MCG/ML drops Take 6 mLs (48,000 Units total) by mouth 3 (three) times a week. 08/21/21  Yes Gatha Mayer, MD  furosemide (LASIX) 40 MG tablet Take 2 tablets (80 mg total) by mouth daily. Patient taking differently: Take 40 mg by mouth 2 (two) times daily. Morning & early afternoon. 12/16/21  Yes Bensimhon, Shaune Pascal, MD  Heparin Sodium, Porcine, (HEPARIN LOCK FLUSH IJ) Inject 5 mLs as directed See admin instructions. Monday, Tuesday, Wednesday, Thursday and  Friday in the morning with TPN   Yes [provider]  losartan (COZAAR) 25 MG tablet Take 1  tablet (25 mg total) by mouth at bedtime. Patient taking differently: Take 12.5 mg by mouth at bedtime. 12/16/21  Yes Bensimhon, Shaune Pascal, MD  omeprazole (PRILOSEC) 40 MG capsule TAKE 1 CAPSULE BY MOUTH EVERY DAY BEFORE BREAKFAST Patient taking differently: Take 40 mg by mouth daily. 12/27/21  Yes Gatha Mayer, MD  ondansetron (ZOFRAN-ODT) 8 MG disintegrating tablet TAKE 1 TABLET BY MOUTH EVERY 8 HOURS AS NEEDED FOR NAUSEA OR VOMITING. Patient taking differently: Take 8 mg by mouth every 8 (eight) hours as needed for nausea or vomiting. 09/09/21  Yes Gatha Mayer, MD  Pediatric Multivit-Minerals (FLINTSTONES GUMMIES PO) Take 4 tablets by mouth in the morning.   Yes [provider]  potassium chloride SA (KLOR-CON M20) 20 MEQ tablet Take 1 tablet (20 mEq total) by mouth daily. 12/16/21  Yes Bensimhon, Shaune Pascal, MD  sodium chloride 0.9 % infusion Inject 900 mLs into the vein every 14 (fourteen) days. Uses for TPN 10/27/17  Yes [provider]  spironolactone (ALDACTONE) 25 MG tablet TAKE 1 TABLET (25 MG TOTAL) BY MOUTH DAILY. Patient taking differently: Take 25 mg by mouth in the morning. 08/28/21  Yes Bensimhon, Shaune Pascal, MD  Teduglutide, rDNA, 5 MG KIT Inject 3.31 Units into the skin every morning. 10/17/21  Yes Gatha Mayer, MD  vitamin E 1000 UNIT capsule Take 1,000 Units by mouth in the morning.   Yes [provider]  XARELTO 20 MG TABS tablet TAKE 1 TABLET BY MOUTH DAILY WITH SUPPER. Patient taking differently: Take 20 mg by mouth daily with supper. 08/30/21  Yes [name], MD    Physical Exam: Vitals:   02/04/22 2000 02/04/22 2014 02/04/22 2115 02/04/22 2139  BP: 132/87  117/60   Pulse: (!) 130  98   Resp: (!) 31  (!) 26   Temp:      TempSrc:      SpO2: 93% 96% 96% (S) 94%  Weight:      Height:        Physical Exam Constitutional:      General:  She is not in acute distress.    Appearance: Normal appearance.  HENT:     Head: Normocephalic and atraumatic.     Mouth/Throat:     Mouth: Mucous membranes are moist.     Pharynx: Oropharynx is clear.  Eyes:     Extraocular Movements: Extraocular movements intact.     Pupils: Pupils are equal, round, and reactive to light.  Cardiovascular:     Rate and Rhythm: Normal rate and regular rhythm.     Pulses: Normal pulses.     Heart sounds: Normal heart sounds.  Pulmonary:     Effort: Pulmonary effort is normal. No respiratory distress.     Breath sounds: Rales present.  Abdominal:     General: Bowel sounds are normal. There is no distension.     Palpations: Abdomen is soft.     Tenderness: There is no abdominal tenderness.  Musculoskeletal:        General: No swelling or deformity.  Skin:    General: Skin is warm and dry.  Neurological:     General: No focal deficit present.     Mental Status: Mental status is at baseline.    Labs on Admission: I have personally reviewed following labs and imaging studies  CBC: Recent Labs  Lab 02/04/22 1955 02/04/22 1959  WBC 12.2*  --   NEUTROABS 5.7  --   HGB 12.4 14.3  HCT 40.4 42.0  MCV 98.3  --   PLT 121*  --     Basic Metabolic Panel: Recent Labs  Lab 02/04/22 1955 02/04/22 1959  NA 133* 136  K 4.3 4.6  CL 106 105  CO2 17*  --   GLUCOSE 218* 237*  BUN 24* 26*  CREATININE 1.63* 1.60*  CALCIUM 8.4*  --     GFR: Estimated Creatinine Clearance: 32.5 mL/min (A) (by C-G formula based on SCr of 1.6 mg/dL (H)).  Liver Function Tests: Recent Labs  Lab 02/04/22 1955  AST 30  ALT 28  ALKPHOS 180*  BILITOT 0.5  PROT 9.5*  ALBUMIN 3.0*    Urine analysis:    Component Value Date/Time   COLORURINE YELLOW 01/21/2022 1556   APPEARANCEUR CLEAR 01/21/2022 1556   LABSPEC 1.010 01/21/2022 1556   PHURINE 6.0 01/21/2022 1556   GLUCOSEU NEGATIVE 01/21/2022 1556   HGBUR TRACE (A) 01/21/2022 1556   HGBUR trace-intact  04/12/2010 0958   BILIRUBINUR NEGATIVE 01/21/2022 1556   BILIRUBINUR negative 01/04/2022 1339   BILIRUBINUR neg 04/08/2011 0947   KETONESUR NEGATIVE 01/21/2022 1556   PROTEINUR 30 (A) 01/21/2022 1556   UROBILINOGEN 0.2 01/04/2022 1339   UROBILINOGEN 0.2 09/17/2012 1122   NITRITE NEGATIVE 01/21/2022 1556   LEUKOCYTESUR NEGATIVE 01/21/2022 1556    Radiological Exams on Admission: DG Chest Port 1 View  Result Date: 02/04/2022 CLINICAL DATA:  Benadryl ingestion EXAM: PORTABLE CHEST 1 VIEW COMPARISON:  Radiographs 01/21/2022 FINDINGS: Increased bilateral hazy airspace and interstitial opacities greatest in the lower lungs compared to 01/21/2022. Remainder unchanged. Stable cardiomegaly. No pleural effusion or pneumothorax. No acute osseous abnormality. Aortic atherosclerotic calcification. Left IJ CVC tip in the right atrium. IMPRESSION: Hazy airspace and interstitial opacities favored to represent edema. Electronically Signed   By: Placido Sou M.D.   On: 02/04/2022 20:00    EKG: Independently reviewed.  Sinus versus ectopic atrial rhythm at 97 bpm.  Left bundle branch block.  Similar to previous but rate faster.  Assessment/Plan Principal Problem:   Acute respiratory failure with hypoxia (HCC) Active Problems:   Acquired short bowel syndrome   GERD (gastroesophageal reflux disease)   Acute renal failure superimposed on stage 3a chronic kidney disease (HCC)   H/O mesenteric infarction   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HCC)   History of CVA (cerebrovascular accident)   Carotid artery stenosis   Nonrheumatic mitral valve regurgitation   Acute on chronic systolic CHF (congestive heart failure) (HCC)   Flash pulmonary edema (HCC)   Acute respiratory failure with hypoxia Acute on chronic systolic heart failure Mitral regurgitation Flash pulmonary edema > Patient initially presented for overdose.  Below.  She received 200 cc in route by EMS.  In the ED she developed progressive worsening  shortness of breath resulting respiratory failure requiring BiPAP. > Received 40 mg total IV Lasix and DuoNeb in the ED with some improvement now on 10 L. > Suspect patient went into flash pulmonary edema considering the diffuse hazy airspace disease and interstitial opacities on chest x-ray and rapid course of her hypoxia.  BNP elevated to greater than 2700. > Known history of systolic CHF mitral regurgitation with last echo done earlier this month as a part of [facility] work-up showing EF of 30-35%, mildly reduced RV function, moderate to severe mitral regurgitation. > Noted to have lactic acid of 5.2 on first check in ED this is presumably due to increase muscle usage during her flash pulm edema episode, low suspicion for car genic shock given  good blood pressure and warm extremities.  Low suspicion for infectious etiology though this is being ruled out as below. - Monitor on stepdown unit - As needed BiPAP, can continue with high flow nasal cannula versus partial nonrebreather for now if patient tolerates - Plan to continue with the diuresis at least once more in the morning - Strict I's and O's, daily weights - Fluid restricted diet - Check magnesium - Trend renal function and electrolytes - Trend lactic acid - Consider cardiology consult this actually if not significantly improved tomorrow - Continue home spironolactone, losartan  Overdose > Patient presenting after overdose of 10-15 over-the-counter Benadryl after an argument with her significant other over her chronic medical conditions and feeling dependent.  Daughter reports some increasing depression at home no previous history of suicide attempt.  She did take 3 packets of activated charcoal that they had at the house. > Poison control contacted and recommended EKG and to check Tylenol level after 4 hours which was done. > She states that her depression, anxiety, frustration level has increased ever since her episode of cefepime  neurotoxicity. - Psychiatry consult placed - Continue to monitor  AKI on CKD 3a > Creatinine elevated to 1.60 from baseline of 1.2.  This is in the setting of volume overload as above. > Has received diuresis so far in the ED and will see further IV diuresis. - Monitor response to diuresis - Trend renal function and electrolytes  Short-bowel syndrome > On chronic TPN - TPN per pharmacy - Continue home vitamins and supplements - Continue home teduglutide  GERD - Continue home PPI  Hypercoagulable state History of DVT History of mesenteric infarction - Continue home Xarelto  Leukocytosis Recurrent bacteremia > Patient presenting with leukocytosis to 12.2.  Presumably reactive in the setting of recent episode of flash pulmonary edema/acute respiratory failure as above.  Does have history of recurrent bacteremia in the setting of chronic TPN usage. > Not currently on any antibiotics.  Family denies this being her typical infectious presentation as she was feeling fine prior to her overdose attempt as above. > Lactic acid elevation likely secondary to hypoxic episode. - Continue to monitor - Follow-up repeat lactic acid - Trend fever curve and WBC - Follow-up blood cultures  DVT prophylaxis: Xarelto Code Status:   Full Family Communication:  Updated at bedside Disposition Plan:   Patient is from:  Home  Anticipated DC to:  Home  Anticipated DC date:  2 to 3 days  Anticipated DC barriers: None  Consults called:  None Admission status:  Inpatient, stepdown  Severity of Illness: The appropriate patient status for this patient is INPATIENT. Inpatient status is judged to be reasonable and necessary in order to provide the required intensity of service to ensure the patient's safety. The patient's presenting symptoms, physical exam findings, and initial radiographic and laboratory data in the context of their chronic comorbidities is felt to place them at high risk for further  clinical deterioration. Furthermore, it is not anticipated that the patient will be medically stable for discharge from the hospital within 2 midnights of admission.   * I certify that at the point of admission it is my clinical judgment that the patient will require inpatient hospital care spanning beyond 2 midnights from the point of admission due to high intensity of service, high risk for further deterioration and high frequency of surveillance required.Marcelyn Bruins MD [facility]  How to contact the [facility] Attending or Consulting provider  7A - 7P or covering provider during after hours [place], for this patient?   Check the care team in [facility] and look for a) attending/consulting [facility] provider listed and b) the [facility] team listed Log into [email or web address] and use [place]'s universal password to access. If you do not have the password, please contact the hospital operator. Locate the [facility] provider you are looking for under [facility] and page to a number that you can be directly reached. If you still have difficulty reaching the provider, please page the [facility] (Director on Call) for the Hospitalists listed on amion for assistance.  02/04/2022, 10:34 PM
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
## 2022-02-04 NOTE — ED Notes (Signed)
Poison control called for an update on patient.
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
## 2022-02-04 NOTE — Progress Notes (Signed)
Pt wants to to stay off BiPAP. Given O2 w/ PRB mask @ 10L

## 2022-02-04 NOTE — Telephone Encounter (Signed)
Discussed with Dr. Burt Knack, who states the patient should hold Xarelto for 1 dose prior to MitraClip procedure.   Instruction letter made for office visit 9/28.

## 2022-02-05 ENCOUNTER — Inpatient Hospital Stay (HOSPITAL_COMMUNITY): Payer: Medicare Other

## 2022-02-05 ENCOUNTER — Telehealth: Payer: Self-pay

## 2022-02-05 DIAGNOSIS — T485X1A Poisoning by other anti-common-cold drugs, accidental (unintentional), initial encounter: Secondary | ICD-10-CM | POA: Diagnosis present

## 2022-02-05 DIAGNOSIS — T485X4A Poisoning by other anti-common-cold drugs, undetermined, initial encounter: Secondary | ICD-10-CM

## 2022-02-05 DIAGNOSIS — F39 Unspecified mood [affective] disorder: Secondary | ICD-10-CM | POA: Diagnosis present

## 2022-02-05 DIAGNOSIS — J9690 Respiratory failure, unspecified, unspecified whether with hypoxia or hypercapnia: Secondary | ICD-10-CM | POA: Diagnosis not present

## 2022-02-05 DIAGNOSIS — T50901A Poisoning by unspecified drugs, medicaments and biological substances, accidental (unintentional), initial encounter: Secondary | ICD-10-CM | POA: Diagnosis present

## 2022-02-05 DIAGNOSIS — I5023 Acute on chronic systolic (congestive) heart failure: Secondary | ICD-10-CM | POA: Diagnosis not present

## 2022-02-05 DIAGNOSIS — J9601 Acute respiratory failure with hypoxia: Secondary | ICD-10-CM | POA: Diagnosis not present

## 2022-02-05 DIAGNOSIS — T450X2A Poisoning by antiallergic and antiemetic drugs, intentional self-harm, initial encounter: Secondary | ICD-10-CM | POA: Diagnosis not present

## 2022-02-05 DIAGNOSIS — R0902 Hypoxemia: Secondary | ICD-10-CM | POA: Diagnosis not present

## 2022-02-05 LAB — TRIGLYCERIDES: Triglycerides: 142 mg/dL (ref <150)

## 2022-02-05 LAB — RAPID URINE DRUG SCREEN, HOSP PERFORMED
Amphetamines: NOT DETECTED
Barbiturates: NOT DETECTED
Benzodiazepines: NOT DETECTED
Cocaine: NOT DETECTED
Opiates: NOT DETECTED
Tetrahydrocannabinol: NOT DETECTED

## 2022-02-05 LAB — COMPREHENSIVE METABOLIC PANEL
ALT: 28 U/L (ref 0–44)
AST: 41 U/L (ref 15–41)
Albumin: 3.1 g/dL — ABNORMAL LOW (ref 3.5–5.0)
Alkaline Phosphatase: 170 U/L — ABNORMAL HIGH (ref 38–126)
Anion gap: 9 (ref 5–15)
BUN: 27 mg/dL — ABNORMAL HIGH (ref 8–23)
CO2: 22 mmol/L (ref 22–32)
Calcium: 8.6 mg/dL — ABNORMAL LOW (ref 8.9–10.3)
Chloride: 103 mmol/L (ref 98–111)
Creatinine, Ser: 1.73 mg/dL — ABNORMAL HIGH (ref 0.44–1.00)
GFR, Estimated: 31 mL/min — ABNORMAL LOW (ref >60)
Glucose, Bld: 115 mg/dL — ABNORMAL HIGH (ref 70–99)
Potassium: 4.9 mmol/L (ref 3.5–5.1)
Sodium: 134 mmol/L — ABNORMAL LOW (ref 135–145)
Total Bilirubin: 0.6 mg/dL (ref 0.3–1.2)
Total Protein: 8.8 g/dL — ABNORMAL HIGH (ref 6.5–8.1)

## 2022-02-05 LAB — CBC
HCT: 33 % — ABNORMAL LOW (ref 36.0–46.0)
Hemoglobin: 10.4 g/dL — ABNORMAL LOW (ref 12.0–15.0)
MCH: 30.3 pg (ref 26.0–34.0)
MCHC: 31.5 g/dL (ref 30.0–36.0)
MCV: 96.2 fL (ref 80.0–100.0)
Platelets: 92 10*3/uL — ABNORMAL LOW (ref 150–400)
RBC: 3.43 MIL/uL — ABNORMAL LOW (ref 3.87–5.11)
RDW: 17.2 % — ABNORMAL HIGH (ref 11.5–15.5)
WBC: 8.1 10*3/uL (ref 4.0–10.5)
nRBC: 0 % (ref 0.0–0.2)

## 2022-02-05 LAB — PHOSPHORUS: Phosphorus: 4.6 mg/dL (ref 2.5–4.6)

## 2022-02-05 LAB — PREALBUMIN: Prealbumin: 17 mg/dL — ABNORMAL LOW (ref 18–38)

## 2022-02-05 LAB — MAGNESIUM: Magnesium: 1.8 mg/dL (ref 1.7–2.4)

## 2022-02-05 LAB — LACTIC ACID, PLASMA: Lactic Acid, Venous: 2.6 mmol/L (ref 0.5–1.9)

## 2022-02-05 MED ORDER — FUROSEMIDE 40 MG PO TABS: 40.0000 mg | ORAL_TABLET | Freq: Two times a day (BID) | ORAL | Status: AC

## 2022-02-05 MED ORDER — TRAVASOL 10 % IV SOLN
INTRAVENOUS | Status: DC
  Filled 2022-02-05: qty 864

## 2022-02-05 MED ORDER — LOSARTAN POTASSIUM 25 MG PO TABS: 12.5000 mg | ORAL_TABLET | Freq: Every day | ORAL | 3 refills | Status: AC

## 2022-02-05 MED ORDER — INSULIN ASPART 100 UNIT/ML IJ SOLN
0.0000 [IU] | Freq: Four times a day (QID) | INTRAMUSCULAR | Status: DC
  Filled 2022-02-05: qty 0.09

## 2022-02-05 NOTE — Discharge Summary (Signed)
[facility]  Physician Discharge Summary   Patient ID: [name] [name] MRN: [id number] DOB/AGE: [name] [name] 14, 1952 71 y.o.  Admit date: 02/04/2022 Discharge date: 02/05/2022    PCP: [name], MD  DISCHARGE DIAGNOSES:    Overdose of common cold drug   Acquired short bowel syndrome   GERD (gastroesophageal reflux disease)   Acute renal failure superimposed on stage 3a chronic kidney disease ([place])   H/O mesenteric infarction   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[place])   History of CVA (cerebrovascular accident)   Carotid artery stenosis   Nonrheumatic mitral valve regurgitation   Acute respiratory failure with hypoxia (HCC)   Acute on chronic systolic CHF (congestive heart failure) (HCC)   Flash pulmonary edema (HCC)   Unspecified mood (affective) disorder (HCC)    RECOMMENDATIONS FOR OUTPATIENT FOLLOW UP: Patient encouraged to keep her follow-up appointments with her cardiology team Will benefit from blood work in the next 1 week to check her renal function and potassium levels   Home Health: None Equipment/Devices: None  CODE STATUS: Full code  DISCHARGE CONDITION: fair  Diet recommendation: As before  INITIAL HISTORY: 71 y.o. female with medical history significant of short bowel syndrome, GERD, anemia, CKD 3A, CHF, mitral regurgitation, carotid artery disease, atrial fibrillation, history of mesenteric infarction, DVT, hypercoagulability, nonalcoholic fatty liver presenting following overdose at home.   Patient had argument with her significant other related to her chronic medical conditions of being dependent on others.  Follows argument she "felt over it "and over her chronic medical problems.  She then took 10-15 over-the-counter Benadryl's.  She also reportedly disconnected herself from her TPN.  She then took 3 packets of activated charcoal which she had in the house to counteract it.  Daughter reports some increasing depression at home.  No prior suicide attempts.   Patient reports she feels like she has had more depression and anxiety and reacting stronger to arguments since her episode of cefepime toxicity.   In route EMS called poison control and gave patient a 200 cc bolus.  Poison control in the ED recommended EKG and repeat Tylenol level with no need for further activated charcoal for sure she took it.   While in the ED patient developed acute respiratory failure with hypoxia.  Required BiPAP and then high flow nasal cannula at 10 L/min.  Noted to have flash pulmonary edema on chest x-ray.  Given furosemide.     Consultants: Psychiatry   Procedures: None    HOSPITAL COURSE:   Acute respiratory failure with hypoxia Acute on chronic systolic heart failure Mitral regurgitation Flash pulmonary edema > Patient initially presented for overdose.  She received 200 cc in route by EMS.  In the ED she developed progressive worsening shortness of breath resulting respiratory failure requiring BiPAP. > Received 40 mg total IV Lasix and DuoNeb in the ED with some improvement. > Suspect patient went into flash pulmonary edema considering the diffuse hazy airspace disease and interstitial opacities on chest x-ray and rapid course of her hypoxia.  BNP elevated to greater than 2700. > Known history of systolic CHF mitral regurgitation with last echo done earlier this month as a part of MitraClip work-up showing EF of 30-35%, mildly reduced RV function, moderate to severe mitral regurgitation. > Noted to have lactic acid of 5.2 on first check in ED this is presumably due to increase muscle usage during her flash pulm edema episode, low suspicion for car genic shock given good blood pressure and warm extremities.  Low suspicion for  infectious etiology though this is being ruled out as below. Patient has done well in the last 12 hours.  Her oxygen requirements have improved.  She is now down to 2 L by nasal cannula.  She appears to have diuresed well.  Chest x-ray was  repeated and shows improvement in bilateral pulmonary edema Lactic acid level has also improved. She was given an additional dose of furosemide this morning.  Considering her significant improvement she can transition to her usual furosemide regimen. Patient has an appointment with her cardiologist tomorrow to discuss MitraClip.   Overdose > Patient presenting after overdose of 10-15 over-the-counter Benadryl after an argument with her significant other over her chronic medical conditions and feeling dependent.  Daughter reports some increasing depression at home no previous history of suicide attempt.  She did take 3 packets of activated charcoal that they had at the house. > Poison control contacted and recommended EKG and to check Tylenol level after 4 hours which was done. > She states that her depression, anxiety, frustration level has increased ever since her episode of cefepime neurotoxicity. By psychiatry and cleared by them.   AKI on CKD 3a Baseline creatinine not entirely clear she has had values as high as 1.7 previously.  Seems to be reasonably close to baseline.  Has good urine output.  Anticipate some worsening due to IV diuretics.   She was told to have outpatient providers do blood work to check her kidney function and potassium levels in the next 1 week.   Short-bowel syndrome > On chronic TPN - TPN per pharmacy - Continue home vitamins and supplements - Continue home teduglutide   GERD - Continue home PPI   Hypercoagulable state History of DVT History of mesenteric infarction - Continue home Xarelto   Leukocytosis History of recurrent bacteremia Patient presenting with leukocytosis to 12.2.  Presumably reactive in the setting of recent episode of flash pulmonary edema/acute respiratory failure as above.  Does have history of recurrent bacteremia in the setting of chronic TPN usage. Noted to be normal this morning.  Blood cultures are negative so far.  She has been  afebrile.   Patient is stable.  She really wants to go home today.  Cleared by psychiatry.  She is ambulated in the hallway.  Weaned off of oxygen.  Chest x-ray shows improvement.  Reasonable to discharge her today.  He has an appointment with her cardiologist tomorrow.   PERTINENT LABS:  The results of significant diagnostics from this hospitalization (including imaging, microbiology, ancillary and laboratory) are listed below for reference.    Microbiology: Recent Results (from the past 240 hour(s))  Blood culture (routine x 2)     Status: None (Preliminary result)   Collection Time: 02/04/22  8:35 PM   Specimen: BLOOD  Result Value Ref Range Status   Specimen Description   Final    BLOOD BLOOD RIGHT HAND Performed at [facility] [address] [place], [place] [address]    Special Requests   Final    BOTTLES DRAWN AEROBIC AND ANAEROBIC Blood Culture adequate volume Performed at [facility] [address] [place], [place] [address]    Culture   Final    NO GROWTH 2 DAYS Performed at [facility] [address], [place], [place] [address]    Report Status PENDING  Incomplete     Labs:   Basic Metabolic Panel: Recent Labs  Lab 02/04/22 1955 02/04/22 1959 02/05/22 0450  NA  133* 136 134*  K 4.3 4.6 4.9  CL 106 105 103  CO2 17*  --  22  GLUCOSE 218* 237* 115*  BUN 24* 26* 27*  CREATININE 1.63* 1.60* 1.73*  CALCIUM 8.4*  --  8.6*  MG 1.9  --  1.8  PHOS  --   --  4.6   Liver Function Tests: Recent Labs  Lab 02/04/22 1955 02/05/22 0450  AST 30 41  ALT 28 28  ALKPHOS 180* 170*  BILITOT 0.5 0.6  PROT 9.5* 8.8*  ALBUMIN 3.0* 3.1*    CBC: Recent Labs  Lab 02/04/22 1955 02/04/22 1959 02/05/22 0450  WBC 12.2*  --  8.1  NEUTROABS 5.7  --   --   HGB 12.4 14.3 10.4*  HCT 40.4 42.0 33.0*  MCV 98.3  --  96.2  PLT 121*  --  92*    BNP: BNP (last 3 results) Recent Labs    12/02/21 1329 12/09/21 1526  02/04/22 1955  BNP 2,155.2* 3,875.2* 2,714.6*      IMAGING STUDIES DG CHEST PORT 1 VIEW  Result Date: 02/05/2022 CLINICAL DATA:  Acute respiratory failure with hypoxia. EXAM: PORTABLE CHEST 1 VIEW COMPARISON:  February 04, 2022. FINDINGS: Stable cardiomegaly. Left internal jugular catheter is unchanged. Significantly decreased bibasilar opacities are noted suggesting improving atelectasis or edema. The visualized skeletal structures are unremarkable. IMPRESSION: Significantly decreased bibasilar opacities are noted suggesting improving atelectasis or edema. Electronically Signed   By: Marijo Conception M.D.   On: 02/05/2022 12:13   DG Chest Port 1 View  Result Date: 02/04/2022 CLINICAL DATA:  Benadryl ingestion EXAM: PORTABLE CHEST 1 VIEW COMPARISON:  Radiographs 01/21/2022 FINDINGS: Increased bilateral hazy airspace and interstitial opacities greatest in the lower lungs compared to 01/21/2022. Remainder unchanged. Stable cardiomegaly. No pleural effusion or pneumothorax. No acute osseous abnormality. Aortic atherosclerotic calcification. Left IJ CVC tip in the right atrium. IMPRESSION: Hazy airspace and interstitial opacities favored to represent edema. Electronically Signed   By: Placido Sou M.D.   On: 02/04/2022 20:00   Overnight EEG with video  Result Date: 01/23/2022 Lora Havens, MD     01/23/2022  1:16 PM Patient Name: [name] [name] MRN: [id number] Epilepsy Attending: Lora Havens Referring Physician/Provider: Janine Ores, NP Duration: 01/22/2022 1651 to 01/23/2022 1154  Patient history: 71yo F with ams. EEG to evaluate for seizure  Level of alertness: lethargic  AEDs during EEG study: None  Technical aspects: This EEG study was done with scalp electrodes positioned according to the 10-20 International system of electrode placement. Electrical activity was reviewed with band pass filter of 1-70Hz , sensitivity of 7 uV/mm, display speed of 27mm/sec with a 60Hz  notched filter applied  as appropriate. EEG data were recorded continuously and digitally stored.  Video monitoring was available and reviewed as appropriate.  Description: EEG initially showed continuous generalized 3-5hz  theta-delta slowing. Gradually eeg improved and showed predominantly 5-8hz  theta-alpha activity admixed with 2-3Hz  delta slowing.  Generalized periodic discharges with triphasic morphology were also noted at 1-2.5 Hz which gradually improved to 1-1.5Hz , more prominent when awake/stimulated.  Hyperventilation and photic stimulation were not performed.  Patient event button was pressed on 01/22/2022 at 1713 for patient being confused and at 1730 and 1743 or patient crying. Concomitant eeg before, during and after the event didn't show any eeg change to suggest seizure,   ABNORMALITY - Periodic discharges with triphasic morphology, generalized ( GPDs) - Continuous slow, generalized  IMPRESSION: This study showed generalized periodic discharges with  triphasic morphology which gradually improved and were most likely due to toxic-metabolic causes like cefepime toxicity.  Additionally there is moderate diffuse encephalopathy. No definite seizures were seen during the study.  Patient event button was pressed on 01/22/2022 at 1713 for patient being confused and at 1730 and 1743 or patient crying without concomitant eeg change. These events were NOT epileptic.  Lora Havens    EEG adult  Result Date: 01/22/2022 Lora Havens, MD     01/23/2022  9:41 AM Patient Name: [name] [name] MRN: [id number] Epilepsy Attending: Lora Havens Referring Physician/Provider: Janine Ores, NP Date: 01/22/2022 Duration: 28.49 mins Patient history: 71yo F with ams. EEG to evaluate for seizure Level of alertness: Awake AEDs during EEG study: Ativan Technical aspects: This EEG study was done with scalp electrodes positioned according to the 10-20 International system of electrode placement. Electrical activity was reviewed with band pass  filter of 1-70Hz , sensitivity of 7 uV/mm, display speed of 69mm/sec with a 60Hz  notched filter applied as appropriate. EEG data were recorded continuously and digitally stored.  Video monitoring was available and reviewed as appropriate. Description: EEG showed continuous generalized 3-5hz  theta-delta slowing.  Generalized periodic discharges with triphasic morphology were noted at 1-2.5 Hz.  Hyperventilation and photic stimulation were not performed.    ABNORMALITY - Periodic discharges with triphasic morphology, generalized ( GPDs) - Continuous slow, generalized  IMPRESSION: This study showed generalized periodic discharges with triphasic morphology. This pattern can be seen due to toxic-metabolic causes like cefepime toxicity.  However, due to the frequency,would recommend LTM monitoring if clinically indicated.   Additionally there is moderate to severe diffuse encephalopathy. No definite seizures were seen during the study.  Lora Havens   CT Angio Chest/Abd/Pel for Dissection W and/or Wo Contrast  Result Date: 01/21/2022 CLINICAL DATA:  Chest and back pain. EXAM: CT ANGIOGRAPHY CHEST, ABDOMEN AND PELVIS TECHNIQUE: Non-contrast CT of the chest was initially obtained. Multidetector CT imaging through the chest, abdomen and pelvis was performed using the standard protocol during bolus administration of intravenous contrast. Multiplanar reconstructed images and MIPs were obtained and reviewed to evaluate the vascular anatomy. RADIATION DOSE REDUCTION: This exam was performed according to the departmental dose-optimization program which includes automated exposure control, adjustment of the mA and/or kV according to patient size and/or use of iterative reconstruction technique. CONTRAST:  156mL OMNIPAQUE IOHEXOL 350 MG/ML SOLN COMPARISON:  CT PE protocol 12/09/2021 FINDINGS: CTA CHEST FINDINGS Cardiovascular: No evidence for aortic dissection or aneurysm. There are mild calcified atherosclerotic plaque  throughout the aorta. There is no large central pulmonary embolism. The heart is moderately enlarged. There is no pericardial effusion. Left-sided central venous catheter ends in the right atrium. Mediastinum/Nodes: No enlarged mediastinal, hilar, or axillary lymph nodes. Thyroid gland, trachea, and esophagus demonstrate no significant findings. Lungs/Pleura: There is a ground-glass nodular density in the right upper lobe measuring 7 mm image 11/27. There is minimal atelectasis in the lung bases. The lungs are otherwise clear. No pleural effusion or pneumothorax. Musculoskeletal: No chest wall abnormality. No acute or significant osseous findings. Review of the MIP images confirms the above findings. CTA ABDOMEN AND PELVIS FINDINGS VASCULAR Aorta: Normal caliber aorta without aneurysm, dissection, vasculitis or significant stenosis. There are atherosclerotic calcifications throughout the aorta. Celiac: Patent without evidence of aneurysm, dissection, vasculitis or significant stenosis. SMA: Patent without evidence of aneurysm, dissection, vasculitis or significant stenosis. Renals: Limited evaluation secondary to artifact from patient's arms and respiratory motion artifact. Grossly patent bilaterally. IMA:  Not well seen. Inflow: Patent without evidence of aneurysm, dissection, vasculitis or significant stenosis. Veins: No obvious venous abnormality within the limitations of this arterial phase study. Review of the MIP images confirms the above findings. NON-VASCULAR Hepatobiliary: No focal liver abnormality is seen. Status post cholecystectomy. No biliary dilatation. Pancreas: Unremarkable. No pancreatic ductal dilatation or surrounding inflammatory changes. Spleen: Mildly enlarged. Adrenals/Urinary Tract: Rounded hypodensity in the left kidney is too small to characterize, likely a cyst. Otherwise, the kidneys, adrenal glands and bladder are within normal limits. Stomach/Bowel: The colon is fluid filled. The cecum  is midline and mildly dilated. Questionable stricture seen just proximal to the terminal ileum. No wall thickening or surrounding inflammation identified. The appendix is not seen. There is sigmoid colon diverticulosis. Small bowel and stomach are nondilated. No free air. Lymphatic: No enlarged lymph nodes. Reproductive: There is a 4.8 x 4.5 cm left adnexal cystic area. Right adnexa and uterus appear within normal limits. Other: No ascites or focal abdominal wall hernia. Musculoskeletal: No acute or significant osseous findings. Review of the MIP images confirms the above findings. IMPRESSION: 1. No evidence for aortic dissection or aneurysm. 2. Moderate cardiomegaly. 3. No acute cardiopulmonary process. 4. The cecum is dilated and fluid-filled and there is questionable stricture or mass within the colon just proximal to the terminal ileum. Recommend clinical correlation and follow-up. 5. Mild splenomegaly. 6. Subcentimeter left Bosniak 2 lesion, too small to characterize. No follow-up imaging is recommended. JACR 2018 Feb; 264-273, Management of the Incidental RenalMass on CT, [employer] 2021; 814-848, Bosniak Classification of Cystic Renal Masses, Version 2019. 7. 7 mm right ground-glass pulmonary nodule within the upper lobe. Per Fleischner Society Guidelines, recommend a non-contrast Chest CT at 6-12 months to confirm persistence, then additional non-contrast Chest CTs every 2 years until 5 years. If nodule grows or develops solid component(s), consider resection. These guidelines do not apply to immunocompromised patients and patients with cancer. Follow up in patients with significant comorbidities as clinically warranted. For lung cancer screening, adhere to Lung-RADS guidelines. Reference: Radiology. 2017; 284(1):228-43. 8. 4.8 cm left adnexal simple-appearing cyst. Recommend follow-up pelvic US in 6-12 months. Reference: JACR 2020 Feb;17(2):248-254 Electronically Signed   By: Ronney Asters M.D.   On:  01/21/2022 17:26   DG Chest Port 1 View  Result Date: 01/21/2022 CLINICAL DATA:  Mental status changes. Possible sepsis. Recent exchange tunneled central venous catheter. EXAM: PORTABLE CHEST 1 VIEW COMPARISON:  12/09/2021 FINDINGS: Stable cardiac enlargement. Left jugular tunneled central line present with the catheter tip at the SVC/RA junction. There is no evidence of pulmonary edema, consolidation, pneumothorax, nodule or pleural fluid. IMPRESSION: Stable cardiac enlargement and positioning of tunneled left jugular central venous catheter. Electronically Signed   By: Aletta Edouard M.D.   On: 01/21/2022 14:16   CT Head Wo Contrast  Result Date: 01/21/2022 CLINICAL DATA:  Mental status changes of unknown cause, confusion EXAM: CT HEAD WITHOUT CONTRAST TECHNIQUE: Contiguous axial images were obtained from the base of the skull through the vertex without intravenous contrast. RADIATION DOSE REDUCTION: This exam was performed according to the departmental dose-optimization program which includes automated exposure control, adjustment of the mA and/or kV according to patient size and/or use of iterative reconstruction technique. COMPARISON:  CT head 07/23/2020 FINDINGS: Brain: Generalized atrophy. Normal ventricular morphology. No midline shift or mass effect. Small vessel chronic ischemic changes of deep cerebral white matter. No intracranial hemorrhage, mass lesion, evidence of acute infarction, or extra-axial fluid collection. Vascular: No hyperdense vessels. Atherosclerotic  calcification of internal carotid and vertebral arteries at skull base Skull: Demineralized but intact Sinuses/Orbits: Clear Other: N/A IMPRESSION: Atrophy with small vessel chronic ischemic changes of deep cerebral white matter. No acute intracranial abnormalities. Electronically Signed   By: Lavonia Dana M.D.   On: 01/21/2022 13:53   ECHO TEE  Result Date: 01/20/2022    TRANSESOPHOGEAL ECHO REPORT   Patient Name:   [name] [name]  Date of Exam: 01/14/2022 Medical Rec #:  [id number]    Height:       68.0 in Accession #:    [id number]   Weight:       141.8 lb Date of Birth:  07-02-1950   BSA:          1.766 m Patient Age:    53 years     BP:           105/53 mmHg Patient Gender: F            HR:           53 bpm. Exam Location:  Inpatient Procedure: 3D Echo, Transesophageal Echo, Cardiac Doppler, Color Doppler and            Saline Contrast Bubble Study Indications:     I34.0 Nonrheumatic mitral (valve) insufficiency  History:         Patient has prior history of Echocardiogram examinations, most                  recent 12/16/2021. CHF, Abnormal ECG, Stroke, Mitral Valve                  Disease; Arrythmias:Bradycardia.  Sonographer:     Roseanna Rainbow RDCS Referring Phys:  [place] Diagnosing Phys: Cherlynn Kaiser MD PROCEDURE: After discussion of the risks and benefits of a TEE, an informed consent was obtained from the patient. TEE procedure time was 42 minutes. The transesophogeal probe was passed without difficulty through the esophogus of the patient. Imaged were obtained with the patient in a left lateral decubitus position. Local oropharyngeal anesthetic was provided with Cetacaine. Sedation performed by different physician. The patient was monitored while under deep sedation. Anesthestetic sedation was provided intravenously by Anesthesiology: 335mg  of Propofol. Image quality was good. The patient's vital signs; including heart rate, blood pressure, and oxygen saturation; remained stable throughout the procedure. The patient developed no complications during the procedure. IMPRESSIONS  1. Left ventricular ejection fraction, by estimation, is 30 to 35%. The left ventricle has moderately decreased function. The left ventricular internal cavity size was moderately dilated.  2. Right ventricular systolic function is mildly reduced. The right ventricular size is normal.  3. Left atrial size was severely dilated. No left atrial/left atrial  appendage thrombus was detected. The LAA emptying velocity was 52 cm/s.  4. Right atrial size was mildly dilated.  5. Mechanism of mitral valve regurgitation is likely posterior leaflet restriction due to regional wall motion abnormality and atrial functional MR. No pulmonary vein systolic reversals in left sided pulmonary veins, however there are probable systolic pulmonary vein reversals in the right sided pulmonary veins. The mitral valve is grossly normal. Moderate to severe mitral valve regurgitation. No evidence of mitral stenosis. Additionally, in some views the posterior mitral annulus appears hypermobile.  6. The aortic valve is tricuspid. There is mild calcification of the aortic valve. Aortic valve regurgitation is moderate. Mild aortic valve stenosis. Aortic valve area, by VTI measures 1.41 cm. Aortic valve mean gradient measures 10.0 mmHg. Aortic  valve Vmax measures 2.28 m/s.  7. There is Moderate (Grade III) atheroma plaque involving the aortic arch and descending aorta.  8. Agitated saline contrast bubble study was positive with shunting observed after >6 cardiac cycles suggestive of intrapulmonary shunting.  9. Indwelling catheter tip visualized in right atrium. No evidence of vegetation. FINDINGS  Left Ventricle: Left ventricular ejection fraction, by estimation, is 30 to 35%. The left ventricle has moderately decreased function. The left ventricular internal cavity size was moderately dilated. Right Ventricle: Prominent, likely calcified moderator band. The right ventricular size is normal. No increase in right ventricular wall thickness. Right ventricular systolic function is mildly reduced. Left Atrium: Left atrial size was severely dilated. No left atrial/left atrial appendage thrombus was detected. The LAA emptying velocity was 52 cm/s. Right Atrium: Right atrial size was mildly dilated. Pericardium: Trivial pericardial effusion is present. Mitral Valve: Mechanism of mitral valve regurgitation  is likely posterior leaflet restriction due to regional wall motion abnormality and atrial functional MR. No pulmonary vein systolic reversals in left sided pulmonary veins, however there are probable  systolic pulmonary vein reversals in the right sided pulmonary veins. The mitral valve is grossly normal. Moderate to severe mitral valve regurgitation. No evidence of mitral valve stenosis. MV peak gradient, 3.7 mmHg. The mean mitral valve gradient is 1.0 mmHg with average heart rate of 52 bpm. Tricuspid Valve: The tricuspid valve is normal in structure. Tricuspid valve regurgitation is trivial. Aortic Valve: The aortic valve is tricuspid. There is mild calcification of the aortic valve. Aortic valve regurgitation is moderate. Aortic regurgitation PHT measures 570 msec. Mild aortic stenosis is present. Aortic valve mean gradient measures 10.0 mmHg. Aortic valve peak gradient measures 20.8 mmHg. Aortic valve area, by VTI measures 1.41 cm. Pulmonic Valve: The pulmonic valve was normal in structure. Pulmonic valve regurgitation is trivial. Aorta: The aortic root and ascending aorta are structurally normal, with no evidence of dilitation. There is moderate (Grade III) atheroma plaque involving the aortic arch and descending aorta. IAS/Shunts: No atrial level shunt detected by color flow Doppler. Agitated saline contrast was given intravenously to evaluate for intracardiac shunting. Agitated saline contrast bubble study was positive with shunting observed after >6 cardiac cycles suggestive of intrapulmonary shunting.  LEFT VENTRICLE PLAX 2D LVOT diam:     2.10 cm LV SV:         68 LV SV Index:   39 LVOT Area:     3.46 cm  AORTIC VALVE AV Area (Vmax):    1.51 cm AV Area (Vmean):   1.35 cm AV Area (VTI):     1.41 cm AV Vmax:           228.00 cm/s AV Vmean:          142.000 cm/s AV VTI:            0.483 m AV Peak Grad:      20.8 mmHg AV Mean Grad:      10.0 mmHg LVOT Vmax:         99.30 cm/s LVOT Vmean:        55.500  cm/s LVOT VTI:          0.197 m LVOT/AV VTI ratio: 0.41 AI PHT:            570 msec AR Vena Contracta: 0.50 cm  AORTA Ao Root diam: 3.50 cm Ao Asc diam:  3.50 cm MITRAL VALVE MV Area VTI:  1.86 cm       SHUNTS MV  Peak grad: 3.7 mmHg       Systemic VTI:  0.20 m MV Mean grad: 1.0 mmHg       Systemic Diam: 2.10 cm MV Vmax:      0.96 m/s MV Vmean:     56.7 cm/s MR Peak grad:   81.4 mmHg MR Mean grad:   47.0 mmHg MR Vmax:        451.00 cm/s MR Vmean:       310.0 cm/s MR PISA:        1.57 cm MR PISA Radius: 0.50 cm Cherlynn Kaiser MD Electronically signed by Cherlynn Kaiser MD Signature Date/Time: 01/20/2022/11:08:19 PM    Final    IR Fluoro Guide CV Line Left  Result Date: 01/14/2022 INDICATION: 71 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 Patient has a chronic left jugular central line and limited central venous access. Patient recently had fevers and chills. Patient presents for tunneled catheter exchange with fluoroscopy. EXAM: Tunneled central venous catheter exchange with fluoroscopy MEDICATIONS: None ANESTHESIA/SEDATION: None FLUOROSCOPY: Radiation Exposure Index (as provided by the fluoroscopic device): 46 mGy Kerma COMPLICATIONS: None immediate. PROCEDURE: Informed consent was obtained for a tunneled central venous catheter exchange with fluoroscopy. Patient was placed supine on the interventional table. The left chest and catheter were prepped and draped in sterile fashion. Maximal barrier sterile technique was utilized including caps, mask, sterile gowns, sterile gloves, sterile drape, hand hygiene and skin antiseptic. Skin around the catheter exit site was anesthetized with 1% lidocaine. The cuff was exposed using blunt and sharp dissection. The old catheter tip was in the right atrium. The old catheter was easily removed over a stiff Glidewire. A new single-lumen Powerline catheter was cut to 31 cm. Catheter was easily advanced over the wire and tip positioned in the right atrium. The cuff was  placed underneath the skin. The catheter was secured to the skin near the entrance site using Prolene suture. Catheter aspirated and flushed well. Heparinized saline was placed in the catheter and a dressing was placed. Fluoroscopic images were taken and saved for this procedure. FINDINGS: Left jugular central line with vascular stents in the left innominate vein and SVC. Catheter tip in the right atrium. IMPRESSION: Successful exchange of the tunneled central venous catheter with fluoroscopy. Electronically Signed   By: Markus Daft M.D.   On: 01/14/2022 12:59   MR ANGIO HEAD WO CONTRAST  Result Date: 01/12/2022 CLINICAL DATA:  Follow-up examination for stroke, TIA common determine embolic source. EXAM: MRA NECK WITHOUT AND WITH CONTRAST MRA HEAD WITHOUT AND WITH CONTRAST TECHNIQUE: Multiplanar and multiecho pulse sequences of the neck were obtained without and with intravenous contrast. Angiographic images of the neck were obtained using MRA technique without and with intravenous contast.; Angiographic images of the Circle of Willis were obtained using MRA technique without and with intravenous contrast. CONTRAST:  Please see contrast documentation. COMPARISON:  Comparison made with previous brain MRI from 12/20/2021. FINDINGS: MRA NECK FINDINGS AORTIC ARCH: Visualized aortic arch normal in caliber with standard 3 vessel morphology. No hemodynamically significant stenosis seen about the origin of the great vessels. RIGHT CAROTID SYSTEM: Right CCA widely patent without stenosis. Atheromatous change about the right carotid bulb/proximal right ICA with associated stenosis of up to 50% by NASCET criteria (series 103, image 19). Right ICA patent distally without additional stenosis or evidence for dissection. LEFT CAROTID SYSTEM: Left CCA patent from its origin to the bifurcation without stenosis. Atheromatous irregularity about the left carotid bulb/proximal left ICA with associated  mild stenoses of up to 30% by  NASCET criteria (series 104, image 19). Left ICA patent distally without additional stenosis or evidence for dissection. VERTEBRAL ARTERIES: Both vertebral arteries arise from the subclavian arteries. Focal approximate 40% stenosis noted at the origin of the right subclavian artery, proximal to the takeoff of the right vertebral artery (series 102, image 6). Proximal left subclavian artery widely patent. Focal mild stenosis noted at the origin of the left vertebral artery (series 102, image 11). Otherwise, vertebral arteries are widely patent without stenosis or evidence for dissection. Left vertebral artery slightly dominant. Other: None. MRA HEAD FINDINGS ANTERIOR CIRCULATION: Visualized distal cervical segments of the internal carotid arteries are mildly tortuous but widely patent with antegrade flow. Petrous segments widely patent bilaterally. Mild atheromatous irregularity within the carotid siphons without hemodynamically significant stenosis. Origins of the ophthalmic arteries normal. A1 segments patent bilaterally. Normal anterior communicating artery complex. Anterior cerebral arteries patent without stenosis. No significant M1 stenosis. 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 Right MCA bifurcation normal. Distal MCA branches well perfused and symmetric. POSTERIOR CIRCULATION: Both vertebral arteries patent without stenosis. Both PICA patent at their origins. Basilar widely patent to its distal aspect without stenosis. Subtle 2 mm outpouching extending from the left aspect of the mid basilar artery suspicious for an additional small aneurysm (series 2, image 83). Superior cerebellar arteries patent bilaterally. Both PCAs primarily supplied via the basilar. PCAs are widely patent and well perfused to their distal aspects. Anatomic variants: None significant. IMPRESSION: MRA NECK IMPRESSION: 1. 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 2. Atheromatous irregularity about the left carotid bulb/proximal left ICA with associated stenoses of up to 30% by NASCET criteria. 3. 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 4. Mild stenosis at the origin of the left vertebral artery. Left vertebral artery slightly dominant. MRA HEAD IMPRESSION: 1. Wide patency of the intracranial arterial circulation. No large vessel occlusion. 2. Mild for age atheromatous irregularity about the carotid siphons. No hemodynamically significant or correctable stenosis. 3. 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 4. 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 Electronically Signed   By: Jeannine Boga M.D.   On: 01/12/2022 07:12   MR ANGIO NECK W WO CONTRAST  Result Date: 01/12/2022 CLINICAL DATA:  Follow-up examination for stroke, TIA common determine embolic source. EXAM: MRA NECK WITHOUT AND WITH CONTRAST MRA HEAD WITHOUT AND WITH CONTRAST TECHNIQUE: Multiplanar and multiecho pulse sequences of the neck were obtained without and with intravenous contrast. Angiographic images of the neck were obtained using MRA technique without and with intravenous contast.; Angiographic images of the Circle of Willis were obtained using MRA technique without and with intravenous contrast. CONTRAST:  Please see contrast documentation. COMPARISON:  Comparison made with previous brain MRI from 12/20/2021. FINDINGS: MRA NECK FINDINGS AORTIC ARCH: Visualized aortic arch normal in caliber with standard 3 vessel morphology. No hemodynamically significant stenosis seen about the origin of the great vessels. RIGHT CAROTID SYSTEM: Right CCA widely patent without stenosis. Atheromatous change about the right carotid bulb/proximal right ICA with associated stenosis of up to 50% by NASCET criteria (series 103, image 19). Right ICA patent distally without  additional stenosis or evidence for dissection. LEFT CAROTID SYSTEM: Left CCA patent from its origin to the bifurcation without stenosis. Atheromatous irregularity  about the left carotid bulb/proximal left ICA with associated mild stenoses of up to 30% by NASCET criteria (series 104, image 19). Left ICA patent distally without additional stenosis or evidence for dissection. VERTEBRAL ARTERIES: Both vertebral arteries arise from the subclavian arteries. Focal approximate 40% stenosis noted at the origin of the right subclavian artery, proximal to the takeoff of the right vertebral artery (series 102, image 6). Proximal left subclavian artery widely patent. Focal mild stenosis noted at the origin of the left vertebral artery (series 102, image 11). Otherwise, vertebral arteries are widely patent without stenosis or evidence for dissection. Left vertebral artery slightly dominant. Other: None. MRA HEAD FINDINGS ANTERIOR CIRCULATION: Visualized distal cervical segments of the internal carotid arteries are mildly tortuous but widely patent with antegrade flow. Petrous segments widely patent bilaterally. Mild atheromatous irregularity within the carotid siphons without hemodynamically significant stenosis. Origins of the ophthalmic arteries normal. A1 segments patent bilaterally. Normal anterior communicating artery complex. Anterior cerebral arteries patent without stenosis. No significant M1 stenosis. 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 Right MCA bifurcation normal. Distal MCA branches well perfused and symmetric. POSTERIOR CIRCULATION: Both vertebral arteries patent without stenosis. Both PICA patent at their origins. Basilar widely patent to its distal aspect without stenosis. Subtle 2 mm outpouching extending from the left aspect of the mid basilar artery suspicious for an additional small aneurysm (series 2, image 83). Superior  cerebellar arteries patent bilaterally. Both PCAs primarily supplied via the basilar. PCAs are widely patent and well perfused to their distal aspects. Anatomic variants: None significant. IMPRESSION: MRA NECK IMPRESSION: 1. 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 2. Atheromatous irregularity about the left carotid bulb/proximal left ICA with associated stenoses of up to 30% by NASCET criteria. 3. 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 4. Mild stenosis at the origin of the left vertebral artery. Left vertebral artery slightly dominant. MRA HEAD IMPRESSION: 1. Wide patency of the intracranial arterial circulation. No large vessel occlusion. 2. Mild for age atheromatous irregularity about the carotid siphons. No hemodynamically significant or correctable stenosis. 3. 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 4. 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 Electronically Signed   By: Jeannine Boga M.D.   On: 01/12/2022 07:12    DISCHARGE EXAMINATION: Vitals:   02/05/22 1300 02/05/22 1326 02/05/22 1500 02/05/22 1601  BP:  (!) 95/56  (!) 107/55  Pulse: (!) 43 63  (!) 58  Resp: $Remo'17 18  15  'ijzko$ Temp:   97.7 F (36.5 C) 97.8 F (36.6 C)  TempSrc:   Oral Oral  SpO2: 97% 99%  98%  Weight:      Height:       General appearance: Awake alert.  In no distress Resp: Few crackles at the bases.  Improved air entry bilaterally.  Normal effort at rest. Cardio: S1-S2 is normal regular.  No S3-S4.  No rubs murmurs or bruit GI: Abdomen is soft.  Nontender nondistended.  Bowel sounds are present normal.  No masses organomegaly   DISPOSITION: Home  Discharge Instructions     (HEART FAILURE PATIENTS) Call MD:  Anytime you have any of the following symptoms: 1) 3 pound weight gain in 24 hours or 5 pounds in 1 week 2) shortness of breath, with or without a dry hacking cough 3)  swelling in the hands, feet or stomach 4) if you have to sleep on extra pillows at night in order to breathe.   Complete by: As directed    Call MD for:  difficulty breathing, headache or visual disturbances   Complete by: As directed    Call MD for:  extreme fatigue   Complete by: As directed    Call MD for:  persistant dizziness or light-headedness   Complete by: As directed    Call MD for:  persistant nausea and vomiting   Complete by: As directed    Call MD for:  severe uncontrolled pain   Complete by: As directed    Call MD for:  temperature >100.4   Complete by: As directed    Diet - low sodium heart healthy   Complete by: As directed    Discharge instructions   Complete by: As directed    Please resume your furosemide/Lasix only from tomorrow morning.  Please hold your losartan for now.  You will need to have blood work done by your primary care provider to check your kidney function and potassium levels within the next 5 to 7 days.  Seek attention if your symptoms recur.  Keep your follow-up appointments with your cardiologist tomorrow.  You were cared for by a hospitalist during your hospital stay. If you have any questions about your discharge medications or the care you received while you were in the hospital after you are discharged, you can call the unit and asked to speak with the hospitalist on call if the hospitalist that took care of you is not available. Once you are discharged, your primary care physician will handle any further medical issues. Please note that NO REFILLS for any discharge medications will be authorized once you are discharged, as it is imperative that you return to your primary care physician (or establish a relationship with a primary care physician if you do not have one) for your aftercare needs so that they can reassess your need for medications and monitor your lab values. If you do not have a primary care physician, you can call [phone] for a physician  referral.   Increase activity slowly   Complete by: As directed          Allergies as of 02/05/2022       Reactions   Cefepime Other (See Comments)   Severe encephalopathy   Ivp Dye [iodinated Contrast Media] Itching   Can use if taking benadryl         Medication List     STOP taking these medications    diphenhydrAMINE 25 mg capsule Commonly known as: BENADRYL       TAKE these medications    acetaminophen 500 MG tablet Commonly known as: TYLENOL Take 1,000 mg by mouth every 6 (six) hours as needed for mild pain.   ADULT TPN Inject 1,800 mLs into the vein See admin instructions. Pt receives home TPN from [facility]:  1800 mL bag, four nights weekly (Monday, Tuesday, Wednesday, Thursday and Friday for 8 hours (includes 1 hour taper up and down).   CALCIUM + D3 PO Take 1 tablet by mouth in the morning and at bedtime.   cetirizine 10 MG tablet Commonly known as: ZYRTEC Take 10 mg by mouth daily as needed for allergies.   clobetasol ointment 0.05 % Commonly known as: TEMOVATE Apply 1 application  topically 2 (two) times daily as needed (irritation).   empagliflozin 10 MG Tabs tablet Commonly known  as: Jardiance Take 1 tablet (10 mg total) by mouth daily before breakfast.   ergocalciferol (VITAMIN D2) 200 MCG/ML drops Commonly known as: DRISDOL Take 6 mLs (48,000 Units total) by mouth 3 (three) times a week.   FLINTSTONES GUMMIES PO Take 4 tablets by mouth in the morning.   furosemide 40 MG tablet Commonly known as: LASIX Take 1 tablet (40 mg total) by mouth 2 (two) times daily. Morning & early afternoon.   HEPARIN LOCK FLUSH IJ Inject 5 mLs as directed See admin instructions. Monday, Tuesday, Wednesday, Thursday and Friday in the morning with TPN   losartan 25 MG tablet Commonly known as: COZAAR Take 0.5 tablets (12.5 mg total) by mouth at bedtime. Please resume only if your kidney function is stable. Your PCP or cardiologist should advice you. Start  taking on: February 12, 2022 What changed:  how much to take additional instructions These instructions start on February 12, 2022. If you are unsure what to do until then, ask your doctor or other care provider.   omeprazole 40 MG capsule Commonly known as: PRILOSEC TAKE 1 CAPSULE BY MOUTH EVERY DAY BEFORE BREAKFAST What changed: See the new instructions.   ondansetron 8 MG disintegrating tablet Commonly known as: ZOFRAN-ODT TAKE 1 TABLET BY MOUTH EVERY 8 HOURS AS NEEDED FOR NAUSEA OR VOMITING. What changed: reasons to take this   potassium chloride SA 20 MEQ tablet Commonly known as: Klor-Con M20 Take 1 tablet (20 mEq total) by mouth daily.   sodium chloride 0.9 % infusion Inject 900 mLs into the vein every 14 (fourteen) days. Uses for TPN   spironolactone 25 MG tablet Commonly known as: ALDACTONE TAKE 1 TABLET (25 MG TOTAL) BY MOUTH DAILY. What changed: when to take this   Teduglutide (rDNA) 5 MG Kit Inject 3.31 Units into the skin every morning.   vitamin E 1000 UNIT capsule Take 1,000 Units by mouth in the morning.   Xarelto 20 MG Tabs tablet Generic drug: rivaroxaban TAKE 1 TABLET BY MOUTH DAILY WITH SUPPER. What changed: how much to take          Follow-up Information     [name], MD Follow up in 1 week(s).   Specialty: Family Medicine Why: post hospitalization follow up and for blood work to check renal function and potassium levels Contact information: [address] [place] [address] [phone]                 TOTAL DISCHARGE TIME: 35 minutes  Breanna Shorkey [employer] on [email or web address]  02/06/2022, 12:33 PM

## 2022-02-05 NOTE — Care Management Obs Status (Signed)
[facility] NOTIFICATION   Patient Details  Name: [name] [name] MRN: [id number] Date of Birth: 1950-07-15   Medicare Observation Status Notification Given:       Corine Shelter 02/05/2022, 3:40 PM

## 2022-02-05 NOTE — Progress Notes (Signed)
PHARMACY - TOTAL PARENTERAL NUTRITION CONSULT NOTE   Indication: Short bowel syndrome  Patient Measurements: Height: 5\' 8"  (172.7 cm) Weight: 66.1 kg (145 lb 13 oz) IBW/kg (Calculated) : 63.9 TPN AdjBW (KG): 66.1 Body mass index is 22.17 kg/m. Usual Weight:   Assessment: 46 yoF presented to ED from home due to diphenhydramine overdose.  PMH significant for short bowel syndrome on chronic home TPN.    Glucose / Insulin: No hx DM, most recent A1c 5.4 (07/24/20).  Home medication: empagliflozin 10mg .  Blood glucose 115 with AM labs.  Electrolytes: Na low at 134, others WNL.  Ca corrected 9.3, K 4.9, Phos 4.6 at upper end of range.   Renal: SCr elevated at 1.73, baseline SCr ~ 1.2 Hepatic: LFTs, Tbili WNL.  Alk Phos 170.  Intake / Output; MIVF: MD reports volume overload, and noted to have flash pulmonary edema/acute respiratory failure.  Diuresis started in ED.  GI Imaging: GI Surgeries / Procedures:   Central access: Tunneled CVC (last exchange 9/5) TPN start date: prior to admission  Nutritional Goals: Goal TPN rate is 80 mL/hr (provides 86 g of protein and 1900 kcals per day)  RD Assessment: Last on 07/02/21: Kcal:  1755-1970 kcal, Protein:  90-105 grams, Fluid:  >/= 2 L/day Awaiting update RD recs.      Home TPN Formula [name]):    Current Nutrition:  Regular diet and TPN  Plan:  Initiate TPN at 80 mL/hr at 1800 Electrolytes in TPN: Na 74mEq/L, K 3mEq/L, Ca 84mEq/L, Mg 16mEq/L, and Phos 36mmol/L. Cl:Ac 1:1 Add standard MVI and trace elements to TPN Initiate Sensitive q6h SSI and adjust as needed  Monitor TPN labs on Mon/Thurs   Gretta Arab PharmD, BCPS Clinical Pharmacist [facility] main pharmacy [phone] 02/05/2022 10:06 AM

## 2022-02-05 NOTE — Progress Notes (Signed)
[facility] PROGRESS NOTE   [name] [name] XTK:[id number] DOB: 1950/09/07 DOA: 02/04/2022  PCP: [name], MD  Brief History/Interval Summary: 71 y.o. female with medical history significant of short bowel syndrome, GERD, anemia, CKD 3A, CHF, mitral regurgitation, carotid artery disease, atrial fibrillation, history of mesenteric infarction, DVT, hypercoagulability, nonalcoholic fatty liver presenting following overdose at home.   [name] [name] had argument with her significant other related to her chronic medical conditions of being dependent on others.  Follows argument she "felt over it "and over her chronic medical problems.  She then took 10-15 over-the-counter Benadryl's.  She also reportedly disconnected herself from her TPN.  She then took 3 packets of activated charcoal which she had in the house to counteract it.  Daughter reports some increasing depression at home.  No prior suicide attempts.  [name] [name] reports she feels like she has had more depression and anxiety and reacting stronger to arguments since her episode of cefepime toxicity.   In route EMS called poison control and gave [name] [name] a 200 cc bolus.  Poison control in the ED recommended EKG and repeat Tylenol level with no need for further activated charcoal for sure she took it.   While in the ED [name] [name] developed acute respiratory failure with hypoxia.  Required BiPAP and then high flow nasal cannula at 10 L/min.  Noted to have flash pulmonary edema on chest x-ray.  Given furosemide.    Consultants: Psychiatry  Procedures: None yet    Subjective/Interval History: [name] [name] mentions that she is feeling better today compared to yesterday.  Denies any chest pain.  No nausea or vomiting.  Wants to go home as soon as possible.    Assessment/Plan:  Acute respiratory failure with hypoxia Acute on chronic systolic heart failure Mitral regurgitation Flash pulmonary edema > [name] [name] initially presented for overdose.   She received 200 cc in route by EMS.  In the ED she developed progressive worsening shortness of breath resulting respiratory failure requiring BiPAP. > Received 40 mg total IV Lasix and DuoNeb in the ED with some improvement. > Suspect [name] [name] went into flash pulmonary edema considering the diffuse hazy airspace disease and interstitial opacities on chest x-ray and rapid course of her hypoxia.  BNP elevated to greater than 2700. > Known history of systolic CHF mitral regurgitation with last echo done earlier this month as a part of MitraClip work-up showing EF of 30-35%, mildly reduced RV function, moderate to severe mitral regurgitation. > Noted to have lactic acid of 5.2 on first check in ED this is presumably due to increase muscle usage during her flash pulm edema episode, low suspicion for car genic shock given good blood pressure and warm extremities.  Low suspicion for infectious etiology though this is being ruled out as below. [name] [name] has done well in the last 12 hours.  Her oxygen requirements have improved.  She is now down to 2 L by nasal cannula.  She appears to have diuresed well.  Chest x-ray was repeated and shows improvement in bilateral pulmonary edema Lactic acid level has also improved. She was given an additional dose of furosemide this morning.  Considering her significant improvement she can transition to her usual furosemide regimen. [name] [name] has an appointment with her cardiologist tomorrow to discuss MitraClip.   Overdose > [name] [name] presenting after overdose of 10-15 over-the-counter Benadryl after an argument with her significant other over her chronic medical conditions and feeling dependent.  Daughter reports some increasing depression at home no previous history of  suicide attempt.  She did take 3 packets of activated charcoal that they had at the house. > Poison control contacted and recommended EKG and to check Tylenol level after 4 hours which was done. > She states that  her depression, anxiety, frustration level has increased ever since her episode of cefepime neurotoxicity. -Await psychiatry consultation. - Continue to monitor   AKI on CKD 3a Baseline creatinine not entirely clear she has had values as high as 1.7 previously.  Seems to be reasonably close to baseline.  Has good urine output.  Anticipate some worsening due to IV diuretics.  Monitor urine output.  If she is still here in the morning we will recheck labs.   Short-bowel syndrome > On chronic TPN - TPN per pharmacy - Continue home vitamins and supplements - Continue home teduglutide   GERD - Continue home PPI   Hypercoagulable state History of DVT History of mesenteric infarction - Continue home Xarelto   Leukocytosis History of recurrent bacteremia [name] [name] presenting with leukocytosis to 12.2.  Presumably reactive in the setting of recent episode of flash pulmonary edema/acute respiratory failure as above.  Does have history of recurrent bacteremia in the setting of chronic TPN usage. Noted to be normal this morning.  Blood cultures are negative so far.  She has been afebrile.   DVT Prophylaxis: On rivaroxaban Code Status: Full code Family Communication: Discussed with [name] [name] and her daughter Disposition Plan: Home today return home when cleared by psychiatry        Medications: Scheduled:  empagliflozin  10 mg Oral QAC breakfast   multivitamin  15 mL Oral Daily   pantoprazole  40 mg Oral Daily   rivaroxaban  20 mg Oral Q supper   sodium chloride flush  3 mL Intravenous Q12H   Teduglutide (rDNA)  3.31 Units Subcutaneous q morning   vitamin E  1,000 Units Oral Daily   Continuous:  TPN ADULT (ION)     PYP:[id number] **OR** acetaminophen, polyethylene glycol  Antibiotics: Anti-infectives (From admission, onward)    None       Objective:  Vital Signs  Vitals:   02/05/22 0900 02/05/22 1135 02/05/22 1300 02/05/22 1326  BP: 99/71   (!) 95/56  Pulse: 64   (!) 43 63  Resp: (!) '24  17 18  '$ Temp:  97.7 F (36.5 C)    TempSrc:  Oral    SpO2: 100%  97% 99%  Weight:      Height:        Intake/Output Summary (Last 24 hours) at 02/05/2022 1333 Last data filed at 02/05/2022 0245 Gross per 24 hour  Intake --  Output 550 ml  Net -550 ml   Filed Weights   02/04/22 1857  Weight: 66.1 kg    General appearance: Awake alert.  In no distress Resp: Physical Ackles bilateral bases left more than right.  No wheezing or rhonchi. Cardio: S1-S2 is normal regular.  No S3-S4.  No rubs murmurs or bruit GI: Abdomen is soft.  Nontender nondistended.  Bowel sounds are present normal.  No masses organomegaly Extremities: No edema.  Full range of motion of lower extremities. Neurologic: Alert and oriented x3.  No focal neurological deficits.    Lab Results:  Data Reviewed: I have personally reviewed following labs and reports of the imaging studies  CBC: Recent Labs  Lab 02/04/22 1955 02/04/22 1959 02/05/22 0450  WBC 12.2*  --  8.1  NEUTROABS 5.7  --   --   HGB 12.4 14.3  10.4*  HCT 40.4 42.0 33.0*  MCV 98.3  --  96.2  PLT 121*  --  92*    Basic Metabolic Panel: Recent Labs  Lab 02/04/22 1955 02/04/22 1959 02/05/22 0450  NA 133* 136 134*  K 4.3 4.6 4.9  CL 106 105 103  CO2 17*  --  22  GLUCOSE 218* 237* 115*  BUN 24* 26* 27*  CREATININE 1.63* 1.60* 1.73*  CALCIUM 8.4*  --  8.6*  MG 1.9  --  1.8  PHOS  --   --  4.6    GFR: Estimated Creatinine Clearance: 30.1 mL/min (A) (by C-G formula based on SCr of 1.73 mg/dL (H)).  Liver Function Tests: Recent Labs  Lab 02/04/22 1955 02/05/22 0450  AST 30 41  ALT 28 28  ALKPHOS 180* 170*  BILITOT 0.5 0.6  PROT 9.5* 8.8*  ALBUMIN 3.0* 3.1*      Lipid Profile: Recent Labs    02/05/22 0450  TRIG 142     Recent Results (from the past 240 hour(s))  Blood culture (routine x 2)     Status: None (Preliminary result)   Collection Time: 02/04/22  8:35 PM   Specimen: BLOOD  Result  Value Ref Range Status   Specimen Description   Final    BLOOD BLOOD RIGHT HAND Performed at [facility], [place] [address], [place], [place] [address]    Special Requests   Final    BOTTLES DRAWN AEROBIC AND ANAEROBIC Blood Culture adequate volume Performed at [facility] [address], [place], [place] [address]    Culture   Final    NO GROWTH < 12 HOURS Performed at [facility] [address], [place], [place] [address]    Report Status PENDING  Incomplete      Radiology Studies: DG CHEST PORT 1 VIEW  Result Date: 02/05/2022 CLINICAL DATA:  Acute respiratory failure with hypoxia. EXAM: PORTABLE CHEST 1 VIEW COMPARISON:  February 04, 2022. FINDINGS: Stable cardiomegaly. Left internal jugular catheter is unchanged. Significantly decreased bibasilar opacities are noted suggesting improving atelectasis or edema. The visualized skeletal structures are unremarkable. IMPRESSION: Significantly decreased bibasilar opacities are noted suggesting improving atelectasis or edema. Electronically Signed   By: Marijo Conception M.D.   On: 02/05/2022 12:13   DG Chest Port 1 View  Result Date: 02/04/2022 CLINICAL DATA:  Benadryl ingestion EXAM: PORTABLE CHEST 1 VIEW COMPARISON:  Radiographs 01/21/2022 FINDINGS: Increased bilateral hazy airspace and interstitial opacities greatest in the lower lungs compared to 01/21/2022. Remainder unchanged. Stable cardiomegaly. No pleural effusion or pneumothorax. No acute osseous abnormality. Aortic atherosclerotic calcification. Left IJ CVC tip in the right atrium. IMPRESSION: Hazy airspace and interstitial opacities favored to represent edema. Electronically Signed   By: Placido Sou M.D.   On: 02/04/2022 20:00       LOS: 1 day   Leshia Kope [employer] on [email or web address]  02/05/2022, 1:33 PM

## 2022-02-05 NOTE — Progress Notes (Signed)
Per provider Lindon Romp, NP pt has been psych cleared. This CSW will now remove pt from the [facility] shift report. TOC to assist with discharge needs.  Benjaman Kindler, MSW, LCSWA 02/05/2022 2:42 PM

## 2022-02-05 NOTE — Care Management CC44 (Signed)
Condition Code 44 Documentation Completed  Patient Details  Name: [name] [name] MRN: [id number] Date of Birth: [birth year]   Condition Code 44 given:  Yes Patient signature on Condition Code 44 notice:  Yes Documentation of 2 MD's agreement:  Yes Code 44 added to claim:  Yes    Rodney Booze, LCSW 02/05/2022, 3:40 PM

## 2022-02-05 NOTE — Discharge Instructions (Signed)
Discharge recommendations:  Patient is to take medications as prescribed. Please see information for follow-up appointment with psychiatry and therapy. Please follow up with your primary care provider for all medical related needs.   Therapy: We recommend that patient participate in individual therapy to address mental health concerns.  Medications: The patient is to contact a medical professional and/or outpatient provider to address any new side effects that develop. Patient should update outpatient providers of any new medications and/or medication changes.   Safety:  The patient should abstain from use of illicit substances/drugs and abuse of any medications. If symptoms worsen or do not continue to improve or if the patient becomes actively suicidal or homicidal then it is recommended that the patient return to the closest hospital emergency department, the [facility], or call 911 for further evaluation and treatment. National Suicide Prevention Lifeline 1-800-SUICIDE or (814) 309-4066.  About 988 988 offers 24/7 access to trained crisis counselors who can help people experiencing mental health-related distress. People can call or text 988 or chat 988lifeline.org for themselves or if they are worried about a loved one who may need crisis support.  Crisis Mobile: [facility]:                     [phone] (for crisis response 24 hours a day) [facility] [employer] Hotline:                                            270-045-7331   ===========================================================  Outpatient Psychiatry and Counseling   [facility]: Mobile Crisis Management 24 hours:  [phone]   [facility] of the [employer] sliding scale fee and walk in schedule: M-F 8am-12pm/1pm-3pm [place], [place] [address] [place], [place] [address] [phone]    [facility] (Formerly known as [facility] [employer])- new patient walk-in appointments available Monday - Friday 8am -3pm.          [address] [place], [place] [address] [phone] or crisis line- [facility]/ Intensive Outpatient Therapy Program [place], [place] [address] [place]                                                             [phone] [address]  [place], [place] [address]                                                 [facility] [phone]. [place], [place] [address]     [employer] [facility]                                                                                                             [address]  [place], [place] [address]                                                           [phone]   [place], [place], [place] [address] [phone]   [facility]                                   [address]                            [place], [place] [address]                                               [place], [place] [address]                                                [phone]  [facility] [place] [address]   [facility]                                               [address],  [place], [place] [address] [place], [place] [address] [place]                                               [address]                                                [place], [place] [address]                                               [phone]                                                     [facility] [address] [place], [place] [address] [phone] Resources for Temporary Residential Assistance/Crisis Centers

## 2022-02-05 NOTE — Care Management Important Message (Signed)
Important Message  Patient Details  Name: [name] [name] MRN: [id number] Date of Birth: 03/08/1951   Medicare Important Message Given:        Rodney Booze, LCSW 02/05/2022, 3:39 PM

## 2022-02-05 NOTE — Consult Note (Signed)
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 female with medical history significant of short bowel syndrome, GERD, anemia, CKD 3A, CHF, mitral regurgitation, carotid artery disease, atrial fibrillation, history of mesenteric infarction, DVT, hypercoagulability, nonalcoholic fatty liver presenting following overdose at home.  HPI:   Patient reports that she and her husband have been arguing recently because of her significant medical history, which requires frequent follow-up.  She states that they had another argument yesterday, she became frustrated and disconnected her TPN.  She states that she picked up a bottle of Benadryl and put a few in her hand and took them.  States that she immediately regretted it and took 2 charcoal tablets.  Patient denies a history of suicide attempts.  She denies a history of treatment for any psychiatric conditions.  She denies a history of inpatient psychiatric treatment.  Patient reports that she feels she has been more anxious since her episode of cefepime  toxicity a couple of weeks ago.  States that she has also been anxious about her upcoming cardiac procedure.  She reports feeling depressed in the past, mostly situational.  States that she lost her son in 42 and had experienced a significant period of depression related to grief.  Reports some periods of depression related to surgical procedures in the past.  States that she has never been prescribed any antidepressants.   Patient reports that she has an appointment with cardiology tomorrow for an up coming procedure.  States that she does not want to miss the appointment.  States that she was supposed to have labs drawn today in the oncology Center.  States that she rescheduled the appointment for tomorrow morning.  Of note, these labs were collected in the ED today.   Patient reports that her daughter and granddaughter live on the same property.  States that they are a daily part of her and her husband's life.  States that her granddaughter is studying Electronics engineer at [employer], but she is currently at home.  On evaluation, the patient is alert and oriented x 4.  She is cooperative and very pleasant.  Eye contact is good.  Speech is clear and coherent.  She is fairly groomed.  Reports her mood is anxious.  Affect is congruent with mood.  Thought process is coherent, goal-directed, and future oriented.  Thought content is logical.  She denies any auditory and visual hallucinations.  No indication that she is responding to internal stimuli.  No delusions elicited during this assessment.  Denies suicidal ideations.  Denies homicidal ideations.  Denies use of alcohol, marijuana, and  other illicit substances.  Contacted the patient's daughter, [name] [phone], for collateral.  [name] reports that she lives on the same property as her parents.  States that she checks on him daily and also prepares her meals.  She states that her mother has never attempted suicide in the past to her  knowledge.  She states that she feels her mother immediately regretted the overdose.  She states that she does not feel her mother would ever attempt suicide.  She states that she feels safe with her mother returning home.  After the initial assessment, this provider met with the patient and her husband in her room.  Patient's husband recounts the situation above.  He states that he has no concerns for his wife's safety.  He states that he feels comfortable with her being discharged.  Discussed methods to reduce the risk of self-injury or suicide attempts: Frequent conversations regarding unsafe thoughts. Remove all significant sharps. Remove all firearms. Remove all medications, including over-the-counter meds. Consider lockbox for medications and having a responsible person dispense medications until patient has strengthened coping skills. Room checks for sharps or other harmful objects. Secure all chemical substances that can be ingested or inhaled.   Patient and her husband encouraged to participate in therapy. Outpatient resources attached to discharge instructions.    Past Psychiatric History: Patient denies receiving treatment for mental health disorders in the past.  Risk to Self or Others: Is the patient at risk to self? No Has the patient been a risk to self in the past 6 months? No Has the patient been a risk to self within the distant past? No Is the patient a risk to others? No Has the patient been a risk to others in the past 6 months? No Has the patient been a risk to others within the distant past? No  [place] Scale:  [place] ED from 02/04/2022 in [place] DEPT ED to Hosp-Admission (Discharged) from 01/21/2022 in [place] ED from 01/04/2022 in [facility] [place] High Risk No Risk No Risk       AIMS:  , , ,  ,   ASAM:    Substance Abuse:     Past  Medical History:  Past Medical History:  Diagnosis Date   Abnormal LFTs 2013   Allergic rhinosinusitis    Allergy    Anemia of chronic disease    At risk for dental problems    Atypical nevus    Bacteremia 10/27/2020   Bacteremia due to Klebsiella pneumoniae 12/12/2011   Bacterial overgrowth syndrome    Brachial vein thrombus, 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   Carnitine deficiency ([place]) 05/25/2018   Carotid stenosis    Carotid US (9/15):  R 40-59%; L 1-39% >> FU 1 year   Cataract    Closed left subtrochanteric femur fracture (HCC) 12/31/2017   Clotting disorder (HCC)    Congestive heart failure (CHF) (HCC)    EF 25-30%   COVID-19 2022   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   Fever 10/29/2020   Fracture of left clavicle    GERD (gastroesophageal reflux disease)    History of blood transfusion 2013   anemia   Hx of cardiovascular stress test    Myoview (9/15):  inf-apical scar; no ischemia; EF 47% - low risk    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 12/12/2011   Osteoporosis    Pancytopenia 10/07/2011  Pathologic fracture of neck of femur (HCC)    Personal history of colonic polyps    Renal insufficiency    hx of yrs ago   Serratia marcescens infection - bactermia assoc w/ PICC 01/18/2015   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[place])    Splenomegaly    By ultrasound   Stroke (cerebrum) ([place]) 07/25/2020   Stroke ([place]) 07/2020   Thrombophilia ([place])    Vitamin D deficiency     Past Surgical History:  Procedure Laterality Date   APPENDECTOMY  yrs ago   BUBBLE STUDY  01/14/2022   Procedure: BUBBLE STUDY;  Surgeon: [name], MD;  Location: [facility] ENDOSCOPY;  Service: Cardiology;;   CHOLECYSTECTOMY  yrs ago   COLONOSCOPY  12/05/2005   internal hemorrhoids (for polyp surveillance)   COLONOSCOPY  04/26/2012   Procedure: COLONOSCOPY;  Surgeon: Gatha Mayer, MD;  Location: [facility] ENDOSCOPY;  Service: Endoscopy;  Laterality: N/A;    COLONOSCOPY WITH PROPOFOL N/A 03/18/2016   Procedure: COLONOSCOPY WITH PROPOFOL;  Surgeon: Gatha Mayer, MD;  Location: [facility] ENDOSCOPY;  Service: Endoscopy;  Laterality: N/A;   ESOPHAGOGASTRODUODENOSCOPY  01/22/2009   erosive esophagitis   HARDWARE REMOVAL Left 12/31/2017   Procedure: HARDWARE REMOVAL;  Surgeon: Rod Can, MD;  Location: [facility] ORS;  Service: Orthopedics;  Laterality: Left;   INTRAMEDULLARY (IM) NAIL INTERTROCHANTERIC Left 12/31/2017   Procedure: INTRAMEDULLARY (IM) NAIL SUBTROCHANTRIC;  Surgeon: Rod Can, MD;  Location: [facility] ORS;  Service: Orthopedics;  Laterality: Left;   IR CV LINE INJECTION  03/23/2018   IR CV LINE INJECTION  10/02/2021   IR FLUORO GUIDE CV LINE LEFT  01/01/2018   IR FLUORO GUIDE CV LINE LEFT  03/24/2018   IR FLUORO GUIDE CV LINE LEFT  05/21/2018   IR FLUORO GUIDE CV LINE LEFT  07/09/2018   IR FLUORO GUIDE CV LINE LEFT  12/28/2020   IR FLUORO GUIDE CV LINE LEFT  07/02/2021   IR FLUORO GUIDE CV LINE LEFT  01/14/2022   IR FLUORO GUIDE CV LINE RIGHT  11/01/2020   IR GENERIC HISTORICAL  07/04/2016   IR REMOVAL TUN CV CATH W/O FL 07/04/2016 Ascencion Dike, PA-C [facility]-INTER[facility] RAD   IR GENERIC HISTORICAL  07/10/2016   IR US GUIDE VASC ACCESS LEFT 07/10/2016 Arne Cleveland, MD [facility]   IR GENERIC HISTORICAL  07/10/2016   IR FLUORO GUIDE CV LINE LEFT 07/10/2016 Arne Cleveland, MD [facility]   IR PATIENT EVAL TECH 0-60 MINS  10/07/2017   IR PATIENT EVAL TECH 0-60 MINS  03/09/2018   IR RADIOLOGIST EVAL & MGMT  07/21/2017   IR RADIOLOGIST EVAL & MGMT  11/14/2020   IR RADIOLOGIST EVAL & MGMT  11/27/2020   IR RADIOLOGIST EVAL & MGMT  01/24/2021   IR REMOVAL TUN CV CATH W/O FL  12/28/2020   IR TRANSCATH PLC STENT  INITIAL VEIN  INC ANGIOPLASTY  12/28/2020   IR US GUIDE VASC ACCESS LEFT  11/01/2020   IR US GUIDE VASC ACCESS LEFT  12/28/2020   IR US GUIDE VASC ACCESS LEFT  12/28/2020   IR US GUIDE VASC ACCESS LEFT  12/28/2020   IR US GUIDE VASC ACCESS RIGHT  11/01/2020   IR  VENO/EXT/UNI LEFT  11/01/2020   IR VENOCAVAGRAM SVC  12/28/2020   LEFT HEART CATH AND CORONARY ANGIOGRAPHY N/A 06/26/2020   Procedure: LEFT HEART CATH AND CORONARY ANGIOGRAPHY;  Surgeon: Jolaine Artist, MD;  Location: [place]  CV LAB;  Service: Cardiovascular;  Laterality: N/A;   ORIF PROXIMAL FEMORAL FRACTURE W/ ITST NAIL SYSTEM  03/2007   left, Dr. Shellia Carwin   RIGHT HEART CATH N/A 01/01/2022   Procedure: RIGHT HEART CATH;  Surgeon: Jolaine Artist, MD;  Location: [place] CV LAB;  Service: Cardiovascular;  Laterality: N/A;   SMALL INTESTINE SURGERY  2005   multiple with right colon resection for ischemia/infarct   TEE WITHOUT CARDIOVERSION N/A 01/14/2022   Procedure: TRANSESOPHAGEAL ECHOCARDIOGRAM (TEE);  Surgeon: [name], MD;  Location: [place];  Service: Cardiology;  Laterality: N/A;   Family History:  Family History  Problem Relation Age of Onset   Diabetes Mother    Hypertension Mother    AAA (abdominal aortic aneurysm) Mother    Colon cancer Neg Hx    Stomach cancer Neg Hx     Social History:  Social History   Substance and Sexual Activity  Alcohol Use Never     Social History   Substance and Sexual Activity  Drug Use Never    Social History   Socioeconomic History   Marital status: Married    Spouse name: Not on file   Number of children: 1   Years of education: Not on file   Highest education level: Not on file  Occupational History   Not on file  Tobacco Use   Smoking status: Never   Smokeless tobacco: Never  Vaping Use   Vaping Use: Never used  Substance and Sexual Activity   Alcohol use: Never   Drug use: Never   Sexual activity: Yes    Comment: 1st intercourse 41 yo-5 partners  Other Topics Concern   Not on file  Social History Narrative   ** Merged History Encounter **       Married to [place], has 1 daughter and a granddaughter the patient's son died in childhood due to a motor vehicle wreck Disabled due to illness  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 No alcohol tobacco or drug use 04/07/2017    Social Determinants of Health   Financial Resource Strain: Medium Risk (12/30/2017)   Overall Financial Resource Strain (CARDIA)    Difficulty of Paying Living Expenses: Somewhat hard  Food Insecurity: No Food Insecurity (01/22/2022)   Hunger Vital Sign    Worried About Running Out of Food in the Last Year: Never true    Ran Out of Food in the Last Year: Never true  Transportation Needs: No Transportation Needs (01/22/2022)   PRAPARE - Hydrologist (Medical): No    Lack of Transportation (Non-Medical): No  Physical Activity: Insufficiently Active (12/30/2017)   Exercise Vital Sign    Days of Exercise per Week: 7 days    Minutes of Exercise per Session: 20 min  Stress: No Stress Concern Present (12/30/2017)   [place]    Feeling of Stress : Not at all  Social Connections: Moderately Integrated (12/30/2017)   Social Connection and Isolation Panel [NHANES]    Frequency of Communication with Friends and Family: More than three times a week    Frequency of Social Gatherings with Friends and Family: Three times a week    Attends Religious Services: 1 to 4 times per year    Active Member of Clubs or Organizations: No    Attends Archivist Meetings: Never    Marital Status: Married   Additional Social History:    Allergies:  Allergies  Allergen Reactions   Cefepime Other (See Comments)    Severe encephalopathy   Ivp Dye [Iodinated Contrast Media] Itching    Can use if taking benadryl     Labs:  Results for orders placed or performed during the hospital encounter of 02/04/22 (from the past 48 hour(s))  CBC with Differential     Status: Abnormal   Collection Time: 02/04/22  7:55 PM  Result Value Ref Range   WBC 12.2 (H) 4.0 - 10.5 K/uL   RBC 4.11 3.87 - 5.11 MIL/uL    Hemoglobin 12.4 12.0 - 15.0 g/dL   HCT 40.4 36.0 - 46.0 %   MCV 98.3 80.0 - 100.0 fL   MCH 30.2 26.0 - 34.0 pg   MCHC 30.7 30.0 - 36.0 g/dL   RDW 17.4 (H) 11.5 - 15.5 %   Platelets 121 (L) 150 - 400 K/uL   nRBC 0.0 0.0 - 0.2 %   Neutrophils Relative % 47 %   Neutro Abs 5.7 1.7 - 7.7 K/uL   Lymphocytes Relative 41 %   Lymphs Abs 5.0 (H) 0.7 - 4.0 K/uL   Monocytes Relative 6 %   Monocytes Absolute 0.8 0.1 - 1.0 K/uL   Eosinophils Relative 5 %   Eosinophils Absolute 0.6 (H) 0.0 - 0.5 K/uL   Basophils Relative 1 %   Basophils Absolute 0.1 0.0 - 0.1 K/uL   Immature Granulocytes 0 %   Abs Immature Granulocytes 0.03 0.00 - 0.07 K/uL    Comment: Performed at [facility], [place] [address], [place], [place] [address]  Comprehensive metabolic panel     Status: Abnormal   Collection Time: 02/04/22  7:55 PM  Result Value Ref Range   Sodium 133 (L) 135 - 145 mmol/L   Potassium 4.3 3.5 - 5.1 mmol/L   Chloride 106 98 - 111 mmol/L   CO2 17 (L) 22 - 32 mmol/L   Glucose, Bld 218 (H) 70 - 99 mg/dL    Comment: Glucose reference range applies only to samples taken after fasting for at least 8 hours.   BUN 24 (H) 8 - 23 mg/dL   Creatinine, Ser 1.63 (H) 0.44 - 1.00 mg/dL   Calcium 8.4 (L) 8.9 - 10.3 mg/dL   Total Protein 9.5 (H) 6.5 - 8.1 g/dL   Albumin 3.0 (L) 3.5 - 5.0 g/dL   AST 30 15 - 41 U/L   ALT 28 0 - 44 U/L   Alkaline Phosphatase 180 (H) 38 - 126 U/L   Total Bilirubin 0.5 0.3 - 1.2 mg/dL   GFR, Estimated 34 (L) >60 mL/min    Comment: (NOTE) Calculated using the CKD-EPI Creatinine Equation (2021)    Anion gap 10 5 - 15    Comment: Performed at [facility], [place] [address], [place], [place] [address]  Salicylate level     Status: Abnormal   Collection Time: 02/04/22  7:55 PM  Result Value Ref Range   Salicylate Lvl <1.9 (L) 7.0 - 30.0 mg/dL    Comment: Performed at [facility], [place] [address], [place], [place] [address]   Acetaminophen level     Status: Abnormal   Collection Time: 02/04/22  7:55 PM  Result Value Ref Range   Acetaminophen (Tylenol), Serum <10 (L) 10 - 30 ug/mL    Comment: (NOTE) Therapeutic concentrations vary significantly. A range of 10-30 ug/mL  may be an effective concentration for many patients. However, some  are best treated at concentrations  outside of this range. Acetaminophen concentrations >150 ug/mL at 4 hours after ingestion  and >50 ug/mL at 12 hours after ingestion are often associated with  toxic reactions.  Performed at [facility], [place] [address], [place], [place] [address]   Ethanol     Status: None   Collection Time: 02/04/22  7:55 PM  Result Value Ref Range   Alcohol, Ethyl (B) <10 <10 mg/dL    Comment: (NOTE) Lowest detectable limit for serum alcohol is 10 mg/dL.  For medical purposes only. Performed at [facility], [place] [address], [place], [place] [address]   Brain natriuretic peptide     Status: Abnormal   Collection Time: 02/04/22  7:55 PM  Result Value Ref Range   B Natriuretic Peptide 2,714.6 (H) 0.0 - 100.0 pg/mL    Comment: Performed at [facility], [place] [address], [place], [place] [address]  Lactic acid, plasma     Status: Abnormal   Collection Time: 02/04/22  7:55 PM  Result Value Ref Range   Lactic Acid, Venous 5.2 (HH) 0.5 - 1.9 mmol/L    Comment: CRITICAL RESULT CALLED TO, READ BACK BY AND VERIFIED WITH BONIS, RN @ 2040 02/04/2022 PER ENGLAD, K Performed at [facility], [place] [address] [place], [place] [address]   Magnesium     Status: None   Collection Time: 02/04/22  7:55 PM  Result Value Ref Range   Magnesium 1.9 1.7 - 2.4 mg/dL    Comment: Performed at [facility], [place] [address] [place], [place] [address]  I-stat chem 8, ED (not at [facility] [place] [facility] or [facility])     Status: Abnormal   Collection Time: 02/04/22  7:59 PM  Result Value Ref Range   Sodium  136 135 - 145 mmol/L   Potassium 4.6 3.5 - 5.1 mmol/L   Chloride 105 98 - 111 mmol/L   BUN 26 (H) 8 - 23 mg/dL   Creatinine, Ser 1.60 (H) 0.44 - 1.00 mg/dL   Glucose, Bld 237 (H) 70 - 99 mg/dL    Comment: Glucose reference range applies only to samples taken after fasting for at least 8 hours.   Calcium, Ion 1.05 (L) 1.15 - 1.40 mmol/L   TCO2 19 (L) 22 - 32 mmol/L   Hemoglobin 14.3 12.0 - 15.0 g/dL   HCT 42.0 36.0 - 46.0 %  Blood gas, arterial (at [facility])     Status: Abnormal   Collection Time: 02/04/22  8:00 PM  Result Value Ref Range   pH, Arterial 7.24 (L) 7.35 - 7.45   pCO2 arterial 33 32 - 48 mmHg   pO2, Arterial 74 (L) 83 - 108 mmHg   Bicarbonate 14.2 (L) 20.0 - 28.0 mmol/L   Acid-base deficit 12.3 (H) 0.0 - 2.0 mmol/L   O2 Saturation 94.3 %   Patient temperature 36.4    Drawn by [address]     Comment: Performed at [facility], [place] [address], [place], [place] [address]  Blood culture (routine x 2)     Status: None (Preliminary result)   Collection Time: 02/04/22  8:35 PM   Specimen: BLOOD  Result Value Ref Range   Specimen Description      BLOOD BLOOD RIGHT HAND Performed at [place] [address], [place],  [address]    Special Requests      BOTTLES DRAWN AEROBIC AND ANAEROBIC Blood Culture adequate volume Performed at [facility], [place] [address], [place], [place] [address]    Culture      NO GROWTH < 12 HOURS Performed at [place] [address], [place], [place] [address]    Report Status PENDING   Urinalysis, Routine w reflex microscopic Urine, Clean Catch     Status: Abnormal   Collection Time: 02/04/22 10:50 PM  Result Value Ref Range   Color, Urine YELLOW YELLOW   APPearance CLEAR CLEAR   Specific Gravity, Urine 1.006 1.005 - 1.030   pH 5.0 5.0 - 8.0   Glucose, UA NEGATIVE NEGATIVE mg/dL   Hgb urine dipstick SMALL (A) NEGATIVE   Bilirubin Urine NEGATIVE NEGATIVE   Ketones,  ur NEGATIVE NEGATIVE mg/dL   Protein, ur NEGATIVE NEGATIVE mg/dL   Nitrite NEGATIVE NEGATIVE   Leukocytes,Ua NEGATIVE NEGATIVE   RBC / HPF 0-5 0 - 5 RBC/hpf   WBC, UA 6-10 0 - 5 WBC/hpf   Bacteria, UA NONE SEEN NONE SEEN   Squamous Epithelial / LPF 0-5 0 - 5   Mucus PRESENT    Hyaline Casts, UA PRESENT     Comment: Performed at [facility], [place] [address], [place], [place] [address]  Comprehensive metabolic panel     Status: Abnormal   Collection Time: 02/05/22  4:50 AM  Result Value Ref Range   Sodium 134 (L) 135 - 145 mmol/L   Potassium 4.9 3.5 - 5.1 mmol/L   Chloride 103 98 - 111 mmol/L   CO2 22 22 - 32 mmol/L   Glucose, Bld 115 (H) 70 - 99 mg/dL    Comment: Glucose reference range applies only to samples taken after fasting for at least 8 hours.   BUN 27 (H) 8 - 23 mg/dL   Creatinine, Ser 1.73 (H) 0.44 - 1.00 mg/dL   Calcium 8.6 (L) 8.9 - 10.3 mg/dL   Total Protein 8.8 (H) 6.5 - 8.1 g/dL   Albumin 3.1 (L) 3.5 - 5.0 g/dL   AST 41 15 - 41 U/L   ALT 28 0 - 44 U/L   Alkaline Phosphatase 170 (H) 38 - 126 U/L   Total Bilirubin 0.6 0.3 - 1.2 mg/dL   GFR, Estimated 31 (L) >60 mL/min    Comment: (NOTE) Calculated using the CKD-EPI Creatinine Equation (2021)    Anion gap 9 5 - 15    Comment: Performed at [facility], [place] [address], [place], [place] [address]  CBC     Status: Abnormal   Collection Time: 02/05/22  4:50 AM  Result Value Ref Range   WBC 8.1 4.0 - 10.5 K/uL   RBC 3.43 (L) 3.87 - 5.11 MIL/uL   Hemoglobin 10.4 (L) 12.0 - 15.0 g/dL   HCT 33.0 (L) 36.0 - 46.0 %   MCV 96.2 80.0 - 100.0 fL   MCH 30.3 26.0 - 34.0 pg   MCHC 31.5 30.0 - 36.0 g/dL   RDW 17.2 (H) 11.5 - 15.5 %   Platelets 92 (L) 150 - 400 K/uL    Comment: SPECIMEN CHECKED FOR CLOTS Immature Platelet Fraction may be clinically indicated, consider ordering this additional test [id number] REPEATED TO VERIFY PLATELET COUNT CONFIRMED BY SMEAR    nRBC 0.0 0.0 - 0.2 %     Comment: Performed at [facility], [place] [address] [place], [place] [address]  Magnesium     Status: None   Collection Time: 02/05/22  4:50 AM  Result Value Ref Range   Magnesium 1.8  1.7 - 2.4 mg/dL    Comment: Performed at [facility], [place] [address] [place], [place] [address]  Phosphorus     Status: None   Collection Time: 02/05/22  4:50 AM  Result Value Ref Range   Phosphorus 4.6 2.5 - 4.6 mg/dL    Comment: Performed at [facility], [place] [address] [place], [place] [address]  Triglycerides     Status: None   Collection Time: 02/05/22  4:50 AM  Result Value Ref Range   Triglycerides 142 <150 mg/dL    Comment: Performed at [facility], [place] [address], [place], [place] [address]  Lactic acid, plasma     Status: Abnormal   Collection Time: 02/05/22 11:27 AM  Result Value Ref Range   Lactic Acid, Venous 2.6 (HH) 0.5 - 1.9 mmol/L    Comment: CRITICAL RESULT CALLED TO, READ BACK BY AND VERIFIED WITH WOODY, A. RN AT 1230 ON 02/05/2022 BY MECIAL J. Performed at [facility], [place] [address] [place], [place] [address]    *Note: Due to a large number of results and/or encounters for the requested time period, some results have not been displayed. A complete set of results can be found in Results Review.    Current Facility-Administered Medications  Medication Dose Route Frequency Provider Last Rate Last Admin   acetaminophen (TYLENOL) tablet 650 mg  650 mg Oral Q6H PRN Marcelyn Bruins, MD       Or   acetaminophen (TYLENOL) suppository 650 mg  650 mg Rectal Q6H PRN Marcelyn Bruins, MD       empagliflozin (JARDIANCE) tablet 10 mg  10 mg Oral QAC breakfast Marcelyn Bruins, MD   10 mg at 02/05/22 0753   multivitamin liquid 15 mL  15 mL Oral Daily Marcelyn Bruins, MD   15 mL at 02/05/22 0921   pantoprazole (PROTONIX) EC tablet 40 mg  40 mg Oral Daily Marcelyn Bruins, MD    40 mg at 02/05/22 1245   polyethylene glycol (MIRALAX / GLYCOLAX) packet 17 g  17 g Oral Daily PRN Marcelyn Bruins, MD       rivaroxaban [name] Reasons) tablet 20 mg  20 mg Oral Q supper Marcelyn Bruins, MD   20 mg at 02/05/22 0020   sodium chloride flush (NS) 0.9 % injection 3 mL  3 mL Intravenous Q12H Marcelyn Bruins, MD   3 mL at 02/05/22 8099   Teduglutide (rDNA) KIT 3.31 Units  3.31 Units Subcutaneous q morning Marcelyn Bruins, MD       TPN ADULT (ION)   Intravenous Continuous TPN Shade, Haze Justin, RPH       vitamin E capsule 1,000 Units  1,000 Units Oral Daily Marcelyn Bruins, MD   1,000 Units at 02/05/22 8338   Current Outpatient Medications  Medication Sig Dispense Refill   acetaminophen (TYLENOL) 500 MG tablet Take 1,000 mg by mouth every 6 (six) hours as needed for mild pain.     ADULT TPN Inject 1,800 mLs into the vein See admin instructions. Pt receives home TPN from [facility]:  1800 mL bag, four nights weekly (Monday, Tuesday, Wednesday, Thursday and Friday for 8 hours (includes 1 hour taper up and down).     Calcium Carb-Cholecalciferol (CALCIUM + D3 PO) Take 1 tablet by mouth in the morning and at bedtime.     cetirizine (ZYRTEC) 10 MG tablet Take 10 mg by mouth daily as needed for  allergies.     clobetasol ointment (TEMOVATE) 0.63 % Apply 1 application  topically 2 (two) times daily as needed (irritation).     diphenhydrAMINE (BENADRYL) 25 mg capsule Take 25 mg by mouth every 6 (six) hours as needed for itching or sleep.     empagliflozin (JARDIANCE) 10 MG TABS tablet Take 1 tablet (10 mg total) by mouth daily before breakfast. 90 tablet 3   ergocalciferol (DRISDOL) 200 MCG/ML drops Take 6 mLs (48,000 Units total) by mouth 3 (three) times a week. 720 mL 3   furosemide (LASIX) 40 MG tablet Take 2 tablets (80 mg total) by mouth daily. (Patient taking differently: Take 40 mg by mouth 2 (two) times daily. Morning & early afternoon.) 180 tablet 3   Heparin Sodium,  Porcine, (HEPARIN LOCK FLUSH IJ) Inject 5 mLs as directed See admin instructions. Monday, Tuesday, Wednesday, Thursday and Friday in the morning with TPN     losartan (COZAAR) 25 MG tablet Take 1 tablet (25 mg total) by mouth at bedtime. (Patient taking differently: Take 12.5 mg by mouth at bedtime.) 90 tablet 3   omeprazole (PRILOSEC) 40 MG capsule TAKE 1 CAPSULE BY MOUTH EVERY DAY BEFORE BREAKFAST (Patient taking differently: Take 40 mg by mouth daily.) 90 capsule 1   ondansetron (ZOFRAN-ODT) 8 MG disintegrating tablet TAKE 1 TABLET BY MOUTH EVERY 8 HOURS AS NEEDED FOR NAUSEA OR VOMITING. (Patient taking differently: Take 8 mg by mouth every 8 (eight) hours as needed for nausea or vomiting.) 30 tablet 11   Pediatric Multivit-Minerals (FLINTSTONES GUMMIES PO) Take 4 tablets by mouth in the morning.     potassium chloride SA (KLOR-CON M20) 20 MEQ tablet Take 1 tablet (20 mEq total) by mouth daily. 90 tablet 3   sodium chloride 0.9 % infusion Inject 900 mLs into the vein every 14 (fourteen) days. Uses for TPN     spironolactone (ALDACTONE) 25 MG tablet TAKE 1 TABLET (25 MG TOTAL) BY MOUTH DAILY. (Patient taking differently: Take 25 mg by mouth in the morning.) 90 tablet 3   Teduglutide, rDNA, 5 MG KIT Inject 3.31 Units into the skin every morning. 1 kit 0   vitamin E 1000 UNIT capsule Take 1,000 Units by mouth in the morning.     XARELTO 20 MG TABS tablet TAKE 1 TABLET BY MOUTH DAILY WITH SUPPER. (Patient taking differently: Take 20 mg by mouth daily with supper.) 90 tablet 2    Musculoskeletal: Strength & Muscle Tone: within normal limits Gait & Station: normal Patient leans: N/A   Psychiatric Specialty Exam: Presentation  General Appearance: Neat  Eye Contact:Good  Speech:Clear and Coherent; Normal Rate  Speech Volume:Normal  Handedness:No data recorded  Mood and Affect  Mood:Anxious  Affect:Congruent   Thought Process  Thought Processes:Coherent; Goal Directed;  Linear  Descriptions of Associations:Intact  Orientation:Full (Time, Place and Person)  Thought Content:Logical  History of Schizophrenia/Schizoaffective disorder:No data recorded Duration of Psychotic Symptoms:No data recorded Hallucinations:Hallucinations: None  Ideas of Reference:None  Suicidal Thoughts:Suicidal Thoughts: No  Homicidal Thoughts:Homicidal Thoughts: No   Sensorium  Memory:Immediate Good; Recent Good; Remote Good  Judgment:Fair  Insight:Fair   Executive Functions  Concentration:Good  Attention Span:Good  Sawmill of Knowledge:Good  Language:Good   Psychomotor Activity  Psychomotor Activity:Psychomotor Activity: Normal   Assets  Assets:Communication Skills; Desire for Improvement; Financial Resources/Insurance; Web designer; Social Support; Resilience    Sleep  Sleep:Sleep: Fair   Physical Exam: Physical Exam Constitutional:      General: She is not in  acute distress.    Appearance: She is not ill-appearing, toxic-appearing or diaphoretic.  HENT:     Right Ear: External ear normal.     Left Ear: External ear normal.  Eyes:     General:        Right eye: No discharge.        Left eye: No discharge.  Cardiovascular:     Rate and Rhythm: Normal rate.  Pulmonary:     Effort: Pulmonary effort is normal. No respiratory distress.  Musculoskeletal:        General: Normal range of motion.     Cervical back: Normal range of motion.  Neurological:     Mental Status: She is alert and oriented to person, place, and time.  Psychiatric:        Mood and Affect: Mood is anxious. Mood is not depressed.        Speech: Speech normal.        Behavior: Behavior is cooperative.        Thought Content: Thought content is not paranoid. Thought content does not include homicidal or suicidal ideation.    Review of Systems  Constitutional:  Negative for chills, diaphoresis, fever, malaise/fatigue and weight loss.  HENT:   Negative for congestion.   Respiratory:  Negative for cough and shortness of breath.   Cardiovascular:  Negative for chest pain and palpitations.  Gastrointestinal:  Negative for diarrhea, nausea and vomiting.  Neurological:  Negative for dizziness and seizures.  Psychiatric/Behavioral:  Negative for hallucinations, memory loss, substance abuse and suicidal ideas. The patient is nervous/anxious and has insomnia.   All other systems reviewed and are negative.  Blood pressure (!) 95/56, pulse 63, temperature 97.7 F (36.5 C), temperature source Oral, resp. rate 18, height _0  (1.727 m), weight 66.1 kg, last menstrual period 02/26/2014, SpO2 99 %. Body mass index is 22.17 kg/m.  Medical Decision Making: Marguriete C Pucci is a 71 y.o. female with medical history significant of short bowel syndrome, GERD, anemia, CKD 3A, CHF, mitral regurgitation, carotid artery disease, atrial fibrillation, history of mesenteric infarction, DVT, hypercoagulability, nonalcoholic fatty liver presenting following overdose at home.  After the initial assessment, this provider met with the patient and her husband in her room.  Patient's husband recounts the situation above.  He states that he has no concerns for his wife's safety.  He states that he feels comfortable with her being discharged.  Discussed methods to reduce the risk of self-injury or suicide attempts: Frequent conversations regarding unsafe thoughts. Remove all significant sharps. Remove all firearms. Remove all medications, including over-the-counter meds. Consider lockbox for medications and having a responsible person dispense medications until patient has strengthened coping skills. Room checks for sharps or other harmful objects. Secure all chemical substances that can be ingested or inhaled.   Patient and her husband encouraged to participate in therapy. Outpatient resources attached to discharge instructions.   At time of discharge, patient denies SI,  HI, AVH and is able to contract for safety. She demonstrated no overt evidence of psychosis or mania. Prior to discharge, patient verbalized that she understood warning signs, triggers, and symptoms of worsening mental health and how to access emergency mental health care if they felt it was needed. Patient was instructed to call 911 or return to the emergency room if they experienced any concerning symptoms after discharge. Patient voiced understanding and agreed to this.   Problem 1: Benadryl overdose   Disposition: No evidence of imminent risk to self  or others at present.   Patient does not meet criteria for psychiatric inpatient admission. Supportive therapy provided about ongoing stressors. Discussed crisis plan, support from social network, calling 911, coming to the Emergency Department, and calling Suicide Hotline.  Rozetta Nunnery, NP 02/05/2022 2:20 PM

## 2022-02-05 NOTE — Telephone Encounter (Signed)
Called the patient's daughter, [name], for an update. The patient went to [facility] last night after having an argument with her husband and taking a "handful of Benadryl." She immediately regretted taking the pills, took charcoal tablets, and sought out medical attention.  Discussed MitraClip plan with the patient's daughter. She states that the patient is doing well now and really wants to proceed with MitraClip next week.  Rescheduled the patient from tomorrow to 10/3 with Dr. Burt Knack for final evaluation to give some time to cool down after discharge today. [name] will be present at the visit.  Reiterated to [name] that at that visit, Dr. Burt Knack will decide if OK to proceed with procedure on 10/5 or if it needs to be postponed.  She was grateful for call and agrees with plan.   Of note, spoke with Psychiatry (Dr. Lovette Cliche), who does not believe the patient has any mental health reason to delay procedure. It is not believed she is a harm to herself. (Psych consult - Lindon Romp, NP).

## 2022-02-05 NOTE — ED Notes (Signed)
This RN entered patients room to assess and meet patient. Upon entering room pt was rocking back and forth in a tripod position and was in respiratory distress. Pt had audible rhonchi and had an O2 sat of 72%, HR of 146 and RR of 42.  EDP immediately notified and providers both at bedside. RT contacted to place pt on BiPap.

## 2022-02-06 ENCOUNTER — Inpatient Hospital Stay: Payer: Medicare Other

## 2022-02-06 ENCOUNTER — Ambulatory Visit: Payer: 59 | Admitting: Cardiovascular Disease

## 2022-02-06 ENCOUNTER — Ambulatory Visit: Payer: Medicare Other | Admitting: Hematology and Oncology

## 2022-02-06 ENCOUNTER — Inpatient Hospital Stay: Payer: Medicare Other | Admitting: Hematology and Oncology

## 2022-02-06 ENCOUNTER — Other Ambulatory Visit: Payer: Medicare Other

## 2022-02-06 NOTE — Assessment & Plan Note (Deleted)
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 She received last Aranesp injection February 2016. She hadrequired a total of 3 Aranesp injections in 2015, while she received at least 10 injections in 2014. So we discontinued therapy.  Current treatment:ReinitiatedAranesp 10/26/2020. Also received July 2022. Holding August 2022 (hemoglobin 10.7)  Short bowel syndromeon Gattex, Teduglutide she is currently on TPN 3 nights a week She had surgery on her small intestine for blood clots in 2006 and since then she has short gut syndrome. This is the reason for her chronic disease.  Patient's baseline platelet count is around 100 09/17/2020: Hemoglobin 9.7, platelets 92 10/10/2020: Hemoglobin 8.5, platelets 52 09/10/2021: Hemoglobin 8.9, platelets 80, creatinine 1.17, albumin 3.1, iron saturation 9%, ferritin 72 02/05/2022: Hemoglobin 10.4, platelets 92 (mostly at baseline)  Hospitalization for possible Tylenol overdose requiring hospitalization for respiratory distress.  02/04/2022-02/06/2022  Return to clinic in 1 year for follow-up

## 2022-02-07 ENCOUNTER — Other Ambulatory Visit: Payer: Self-pay

## 2022-02-09 LAB — CULTURE, BLOOD (ROUTINE X 2)
Culture: NO GROWTH
Special Requests: ADEQUATE

## 2022-02-10 ENCOUNTER — Telehealth: Payer: Self-pay

## 2022-02-10 NOTE — Telephone Encounter (Signed)
Received call from [place] MSN at [facility] in regards to pt TPN requesting CBC with diff, CMP, Magnesium, Phosphorus and Pre Albumin be drawn on Tuesday or Wednesday of this week: Pt was made aware:  Pt stated that she has an Office Visit with Dr Burt Knack tomorrow and she will have labs drawn there and requesting if these could be added on: Please advise

## 2022-02-10 NOTE — Progress Notes (Signed)
Surgical Instructions    Your procedure is scheduled on Thursday, 02/13/22.  Report to [facility] Main Entrance "A" at 8:45 A.M., then check in with the Admitting office.  Call this number if you have problems the morning of surgery:  [phone]   If you have any questions prior to your surgery date call [phone]: Open Monday-Friday 8am-4pm If you experience any cold or flu symptoms such as cough, fever, chills, shortness of breath, etc. between now and your scheduled surgery, please notify us at the above number     Remember:  Do not eat or drink after midnight the night before your surgery   The day before surgery, HOLD XARELTO. The morning of your procedure, do not take any medications.  As of today, STOP taking any Aspirin (unless otherwise instructed by your surgeon) Aleve, Naproxen, Ibuprofen, Motrin, Advil, Goody's, BC's, all herbal medications, fish oil, and all vitamins.    WHAT DO I DO ABOUT MY DIABETES MEDICATION?  Hold empagliflozin (JARDIANCE) 72 hours prior to surgery. Do not take Teduglutide the day of surgery.  HOW TO MANAGE YOUR DIABETES BEFORE AND AFTER SURGERY  Why is it important to control my blood sugar before and after surgery? Improving blood sugar levels before and after surgery helps healing and can limit problems. A way of improving blood sugar control is eating a healthy diet by:  Eating less sugar and carbohydrates  Increasing activity/exercise  Talking with your doctor about reaching your blood sugar goals High blood sugars (greater than 180 mg/dL) can raise your risk of infections and slow your recovery, so you will need to focus on controlling your diabetes during the weeks before surgery. Make sure that the doctor who takes care of your diabetes knows about your planned surgery including the date and location.  How do I manage my blood sugar before surgery? Check your blood sugar at least 4 times a day, starting 2 days before surgery, to  make sure that the level is not too high or low.  Check your blood sugar the morning of your surgery when you wake up and every 2 hours until you get to the Short Stay unit.  If your blood sugar is less than 70 mg/dL, you will need to treat for low blood sugar: Do not take insulin. Treat a low blood sugar (less than 70 mg/dL) with  cup of clear juice (cranberry or apple), 4 glucose tablets, OR glucose gel. Recheck blood sugar in 15 minutes after treatment (to make sure it is greater than 70 mg/dL). If your blood sugar is not greater than 70 mg/dL on recheck, call [phone] for further instructions. Report your blood sugar to the short stay nurse when you get to Short Stay.  If you are admitted to the hospital after surgery: Your blood sugar will be checked by the staff and you will probably be given insulin after surgery (instead of oral diabetes medicines) to make sure you have good blood sugar levels. The goal for blood sugar control after surgery is 80-180 mg/dL.  Do not wear jewelry or makeup. Do not wear lotions, powders, perfumes or deodorant. Do not shave 48 hours prior to surgery.  Do not bring valuables to the hospital. Do not wear nail polish, gel polish, artificial nails, or any other type of covering on natural nails (fingers and toes) If you have artificial nails or gel coating that need to be removed by a nail salon, please have this removed prior to surgery. Artificial nails or  gel coating may interfere with anesthesia's ability to adequately monitor your vital signs.  [facility] is not responsible for any belongings or valuables.    Do NOT Smoke (Tobacco/Vaping)  24 hours prior to your procedure  If you use a CPAP at night, you may bring your mask for your overnight stay.   Contacts, glasses, hearing aids, dentures or partials may not be worn into surgery, please bring cases for these belongings   For patients admitted to the hospital, discharge time will be  determined by your treatment team.   Patients discharged the day of surgery will not be allowed to drive home, and someone needs to stay with them for 24 hours.   SURGICAL WAITING ROOM VISITATION Patients having surgery or a procedure may have no more than 2 support people in the waiting area - these visitors may rotate.   Children under the age of 44 must have an adult with them who is not the patient. If the patient needs to stay at the hospital during part of their recovery, the visitor guidelines for inpatient rooms apply. Pre-op nurse will coordinate an appropriate time for 1 support person to accompany patient in pre-op.  This support person may not rotate.   Please refer to the [facility] website for the visitor guidelines for Inpatients (after your surgery is over and you are in a regular room).    Special instructions:    Oral Hygiene is also important to reduce your risk of infection.  Remember - BRUSH YOUR TEETH THE MORNING OF SURGERY WITH YOUR REGULAR TOOTHPASTE   [facility]- Preparing For Surgery  Before surgery, you can play an important role. Because skin is not sterile, your skin needs to be as free of germs as possible. You can reduce the number of germs on your skin by washing with CHG (chlorahexidine gluconate) Soap before surgery.  CHG is an antiseptic cleaner which kills germs and bonds with the skin to continue killing germs even after washing.     Please do not use if you have an allergy to CHG or antibacterial soaps. If your skin becomes reddened/irritated stop using the CHG.  Do not shave (including legs and underarms) for at least 48 hours prior to first CHG shower. It is OK to shave your face.  Please follow these instructions carefully.     Shower the NIGHT BEFORE SURGERY and the MORNING OF SURGERY with CHG Soap.   If you chose to wash your hair, wash your hair first as usual with your normal shampoo. After you shampoo, rinse your hair and body thoroughly  to remove the shampoo.  Then [employer] and genitals (private parts) with your normal soap and rinse thoroughly to remove soap.  After that Use CHG Soap as you would any other liquid soap. You can apply CHG directly to the skin and wash gently with a scrungie or a clean washcloth.   Apply the CHG Soap to your body ONLY FROM THE NECK DOWN.  Do not use on open wounds or open sores. Avoid contact with your eyes, ears, mouth and genitals (private parts). Wash Face and genitals (private parts)  with your normal soap.   Wash thoroughly, paying special attention to the area where your surgery will be performed.  Thoroughly rinse your body with warm water from the neck down.  DO NOT shower/wash with your normal soap after using and rinsing off the CHG Soap.  Pat yourself dry with a CLEAN TOWEL.  Wear CLEAN  PAJAMAS to bed the night before surgery  Place CLEAN SHEETS on your bed the night before your surgery  DO NOT SLEEP WITH PETS.   Day of Surgery: Take a shower with CHG soap. Wear Clean/Comfortable clothing the morning of surgery Do not apply any deodorants/lotions.   Remember to brush your teeth WITH YOUR REGULAR TOOTHPASTE.    If you received a COVID test during your pre-op visit, it is requested that you wear a mask when out in public, stay away from anyone that may not be feeling well, and notify your surgeon if you develop symptoms. If you have been in contact with anyone that has tested positive in the last 10 days, please notify your surgeon.    Please read over the following fact sheets that you were given.

## 2022-02-10 NOTE — Telephone Encounter (Signed)
We should be able to draw her labs here at our office thx

## 2022-02-11 ENCOUNTER — Ambulatory Visit (INDEPENDENT_AMBULATORY_CARE_PROVIDER_SITE_OTHER): Payer: Medicare Other | Admitting: Cardiovascular Disease

## 2022-02-11 ENCOUNTER — Encounter: Payer: Self-pay | Admitting: Cardiovascular Disease

## 2022-02-11 ENCOUNTER — Other Ambulatory Visit: Payer: Self-pay

## 2022-02-11 ENCOUNTER — Encounter (HOSPITAL_COMMUNITY): Payer: Self-pay

## 2022-02-11 ENCOUNTER — Encounter
Admission: RE | Admit: 2022-02-11 | Discharge: 2022-02-11 | Disposition: A | Discharge status: Home / Self Care | Payer: Medicare Other | Source: Ambulatory Visit | Attending: Cardiovascular Disease | Admitting: Cardiovascular Disease

## 2022-02-11 ENCOUNTER — Ambulatory Visit (HOSPITAL_COMMUNITY)
Admission: RE | Admit: 2022-02-11 | Discharge: 2022-02-11 | Disposition: A | Discharge status: Home / Self Care | Payer: Medicare Other | Source: Ambulatory Visit | Attending: Cardiovascular Disease | Admitting: Cardiovascular Disease

## 2022-02-11 VITALS — BP 103/53 | HR 58 | Temp 97.8°F | Resp 18 | Ht 68.0 in | Wt 137.9 lb

## 2022-02-11 VITALS — BP 120/60 | HR 56 | Ht 68.0 in | Wt 138.0 lb

## 2022-02-11 DIAGNOSIS — Z01818 Encounter for other preprocedural examination: Secondary | ICD-10-CM | POA: Diagnosis not present

## 2022-02-11 DIAGNOSIS — I340 Nonrheumatic mitral (valve) insufficiency: Secondary | ICD-10-CM | POA: Diagnosis not present

## 2022-02-11 DIAGNOSIS — N189 Chronic kidney disease, unspecified: Secondary | ICD-10-CM | POA: Insufficient documentation

## 2022-02-11 DIAGNOSIS — Z1152 Encounter for screening for COVID-19: Secondary | ICD-10-CM | POA: Diagnosis not present

## 2022-02-11 DIAGNOSIS — I080 Rheumatic disorders of both mitral and aortic valves: Secondary | ICD-10-CM | POA: Insufficient documentation

## 2022-02-11 DIAGNOSIS — I5022 Chronic systolic (congestive) heart failure: Secondary | ICD-10-CM | POA: Diagnosis not present

## 2022-02-11 DIAGNOSIS — I130 Hypertensive heart and chronic kidney disease with heart failure and stage 1 through stage 4 chronic kidney disease, or unspecified chronic kidney disease: Secondary | ICD-10-CM | POA: Insufficient documentation

## 2022-02-11 DIAGNOSIS — Z01810 Encounter for preprocedural cardiovascular examination: Secondary | ICD-10-CM | POA: Diagnosis not present

## 2022-02-11 DIAGNOSIS — K90829 Short bowel syndrome, unspecified: Secondary | ICD-10-CM

## 2022-02-11 DIAGNOSIS — I6523 Occlusion and stenosis of bilateral carotid arteries: Secondary | ICD-10-CM | POA: Diagnosis not present

## 2022-02-11 HISTORY — DX: Peripheral vascular disease, unspecified: I73.9

## 2022-02-11 HISTORY — DX: Unspecified osteoarthritis, unspecified site: M19.90

## 2022-02-11 LAB — SARS CORONAVIRUS 2 (TAT 6-24 HRS): SARS Coronavirus 2: NEGATIVE

## 2022-02-11 LAB — COMPREHENSIVE METABOLIC PANEL
ALT: 27 U/L (ref 0–44)
AST: 36 U/L (ref 15–41)
Albumin: 2.9 g/dL — ABNORMAL LOW (ref 3.5–5.0)
Alkaline Phosphatase: 150 U/L — ABNORMAL HIGH (ref 38–126)
Anion gap: 11 (ref 5–15)
BUN: 30 mg/dL — ABNORMAL HIGH (ref 8–23)
CO2: 25 mmol/L (ref 22–32)
Calcium: 9 mg/dL (ref 8.9–10.3)
Chloride: 98 mmol/L (ref 98–111)
Creatinine, Ser: 1.58 mg/dL — ABNORMAL HIGH (ref 0.44–1.00)
GFR, Estimated: 35 mL/min — ABNORMAL LOW (ref >60)
Glucose, Bld: 93 mg/dL (ref 70–99)
Potassium: 4.5 mmol/L (ref 3.5–5.1)
Sodium: 134 mmol/L — ABNORMAL LOW (ref 135–145)
Total Bilirubin: 1.1 mg/dL (ref 0.3–1.2)
Total Protein: 8.7 g/dL — ABNORMAL HIGH (ref 6.5–8.1)

## 2022-02-11 LAB — TYPE AND SCREEN
ABO/RH(D): B POS
Antibody Screen: NEGATIVE

## 2022-02-11 LAB — URINALYSIS, ROUTINE W REFLEX MICROSCOPIC
Bilirubin Urine: NEGATIVE
Glucose, UA: NEGATIVE mg/dL
Ketones, ur: NEGATIVE mg/dL
Nitrite: NEGATIVE
Protein, ur: NEGATIVE mg/dL
Specific Gravity, Urine: 1.009 (ref 1.005–1.030)
pH: 5 (ref 5.0–8.0)

## 2022-02-11 LAB — PROTIME-INR
INR: 1.6 — ABNORMAL HIGH (ref 0.8–1.2)
Prothrombin Time: 18.6 seconds — ABNORMAL HIGH (ref 11.4–15.2)

## 2022-02-11 LAB — SURGICAL PCR SCREEN
MRSA, PCR: NEGATIVE
Staphylococcus aureus: NEGATIVE

## 2022-02-11 LAB — MAGNESIUM: Magnesium: 2.3 mg/dL (ref 1.7–2.4)

## 2022-02-11 LAB — CBC
HCT: 33.8 % — ABNORMAL LOW (ref 36.0–46.0)
Hemoglobin: 10.8 g/dL — ABNORMAL LOW (ref 12.0–15.0)
MCH: 30.9 pg (ref 26.0–34.0)
MCHC: 32 g/dL (ref 30.0–36.0)
MCV: 96.6 fL (ref 80.0–100.0)
Platelets: 128 10*3/uL — ABNORMAL LOW (ref 150–400)
RBC: 3.5 MIL/uL — ABNORMAL LOW (ref 3.87–5.11)
RDW: 17.2 % — ABNORMAL HIGH (ref 11.5–15.5)
WBC: 5.5 10*3/uL (ref 4.0–10.5)
nRBC: 0 % (ref 0.0–0.2)

## 2022-02-11 LAB — BRAIN NATRIURETIC PEPTIDE: B Natriuretic Peptide: 2029.1 pg/mL — ABNORMAL HIGH (ref 0.0–100.0)

## 2022-02-11 LAB — PHOSPHORUS: Phosphorus: 3.3 mg/dL (ref 2.5–4.6)

## 2022-02-11 LAB — PREALBUMIN: Prealbumin: 19 mg/dL (ref 18–38)

## 2022-02-11 NOTE — Telephone Encounter (Signed)
Message not seen in time for patient to complete in office. Patient sent today for pre-admission testing and labs will be completed at [facility] hospital during work-up for MitraClip procedure.

## 2022-02-11 NOTE — Progress Notes (Signed)
Staff message sent to Dr. Burt Knack regarding patient's UA

## 2022-02-11 NOTE — Patient Instructions (Signed)
Medication Instructions:  HOLD Xarelto evening dose prior to procedure and day of procedure Your physician recommends that you continue on your current medications as directed. Please refer to the Current Medication list given to you today.  *If you need a refill on your cardiac medications before your next appointment, please call your pharmacy*   Lab Work: NONE If you have labs (blood work) drawn today and your tests are completely normal, you will receive your results only by: [place] (if you have MyChart) OR A paper copy in the mail If you have any lab test that is abnormal or we need to change your treatment, we will call you to review the results.   Testing/Procedures: MitraClip procedure   Follow-Up: At [facility], you and your health needs are our priority.  As part of our continuing mission to provide you with exceptional heart care, we have created designated Provider Care Teams.  These Care Teams include your primary Cardiologist (physician) and Advanced Practice Providers (APPs -  Physician Assistants and Nurse Practitioners) who all work together to provide you with the care you need, when you need it.  We recommend signing up for the patient portal called "MyChart".  Sign up information is provided on this After Visit Summary.  MyChart is used to connect with patients for Virtual Visits (Telemedicine).  Patients are able to view lab/test results, encounter notes, upcoming appointments, etc.  Non-urgent messages can be sent to your provider as well.   To learn more about what you can do with MyChart, go to [email or web address].    Your next appointment:   Structural Team will follow-up  The format for your next appointment:   In Person  Provider:   [name], MD  Other Instructions See separate instructions about MitraClip procedure  Important Information About Sugar

## 2022-02-11 NOTE — H&P (View-Only) (Signed)
Cardiology Office Note:    Date:  02/13/2022   ID:  [name] [name], DOB 1951-04-10, MRN [id number]  PCP:  [name], MD   [place] Providers Cardiologist:  None     Referring MD: [name], MD   Chief Complaint  Patient presents with   Follow-up    History of Present Illness:    [name] [name] is a 71 y.o. female presenting for follow-up of severe mitral regurgitation. The patient was initially evaluated January 03, 2022.  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  She has a history of stroke with residual left-sided weakness and has a known hypercoagulable disorder with multiple upper extremity venous occlusions.  Femoral veins have been demonstrated to be patent.  She was referred for evaluation of transcatheter edge-to-edge repair of the mitral valve in the setting of progressive and now severe mitral regurgitation.  The patient ambulates with a cane but gets around well and remains functionally independent.  She has developed heart failure symptoms with edema, shortness of breath, and orthopnea.  She was referred to the advanced heart failure clinic and was cared for by Dr. Haroldine Laws.  Medical therapy and diuretics were adjusted and she has done better from a cardiac perspective since that time.  She underwent right heart catheterization demonstrating elevated diastolic filling pressures and prominent V waves in the pulmonary wedge tracing suggestive of significant mitral regurgitation.  A transesophageal echo confirmed severe mitral regurgitation.  Her case was reviewed with the multidisciplinary heart valve team, including imaging specialist and cardiac surgery.  We agreed that she is an appropriate candidate for transcatheter edge-to-edge repair of the mitral valve.  She has already undergone evaluation by the advanced heart failure team as part of a  multidisciplinary approach to her care.  Since the patient's evaluation here in August, she was hospitalized with sepsis and developed neurotoxicity related to antibiotic therapy.  She recovered from this hospitalization and then was hospitalized overnight on September 26 after she got into an argument with her husband.  She took a handful of Benadryl as an apparent suicide attempt, but immediately had regret.  She had charcoal at home and took this, then called poison control, then went to the emergency department for further evaluation.  She was formally evaluated by psychiatry who felt that she was not a further harm to herself and she was discharged home the following day.  The patient is here with her daughter today.  She is regretful of her recent episode and states that "I do not know what got into me."  She states that she wants to live and would like to move forward with all treatment that could help her quality of life.  She is doing well at present and denies shortness of breath with her activities of daily living.  No chest pain, palpitations, edema, orthopnea, or PND.  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   Carnitine deficiency ([place]) 05/25/2018   Carotid stenosis    Carotid US (9/15):  R 40-59%;  L 1-39% >> FU 1 year   Cataract    Closed left subtrochanteric femur fracture (HCC) 12/31/2017   Clotting disorder ([place])    Congestive heart failure (CHF) (HCC)    EF 25-30%   COVID-19 2022   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   Fever 10/29/2020   Fracture of left clavicle    GERD (gastroesophageal reflux disease)    History of blood transfusion 2013    anemia   Hx of cardiovascular stress test    Myoview (9/15):  inf-apical scar; no ischemia; EF 47% - low risk    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 12/12/2011   Osteoporosis    Pancytopenia 10/07/2011   Pathologic fracture of neck of femur ([place])    Peripheral vascular disease ([place])    Personal history of colonic polyps    Renal insufficiency    hx of yrs ago   Serratia marcescens infection - bactermia assoc w/ PICC 01/18/2015   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[place])    Splenomegaly    By ultrasound   Stroke (cerebrum) ([place]) 07/25/2020   Stroke ([place]) 07/2020   Thrombophilia ([place])    Vitamin D deficiency     Past Surgical History:  Procedure Laterality Date   APPENDECTOMY  yrs ago   BUBBLE STUDY  01/14/2022   Procedure: BUBBLE STUDY;  Surgeon: [name], MD;  Location: [facility];  Service: Cardiology;;   CATARACT EXTRACTION Bilateral 2023   CHOLECYSTECTOMY  yrs ago   COLONOSCOPY  12/05/2005   internal hemorrhoids (for polyp surveillance)   COLONOSCOPY  04/26/2012   Procedure: COLONOSCOPY;  Surgeon: Gatha Mayer, MD;  [facility]: [facility];  Service: Endoscopy;  Laterality: N/A;   COLONOSCOPY WITH PROPOFOL N/A 03/18/2016   Procedure: COLONOSCOPY WITH PROPOFOL;  Surgeon: Gatha Mayer, MD;  [facility];  Service: Endoscopy;  Laterality: N/A;   ESOPHAGOGASTRODUODENOSCOPY  01/22/2009   erosive esophagitis   HARDWARE REMOVAL Left 12/31/2017   Procedure: HARDWARE REMOVAL;  Surgeon: Rod Can, MD;  Location: [facility];  Service: Orthopedics;  Laterality: Left;   INTRAMEDULLARY (IM) NAIL INTERTROCHANTERIC Left 12/31/2017   Procedure: INTRAMEDULLARY (IM) NAIL SUBTROCHANTRIC;  Surgeon: Rod Can, MD;  [facility]: [facility];  Service: Orthopedics;  Laterality: Left;   IR CV LINE INJECTION  03/23/2018   IR CV LINE INJECTION  10/02/2021   IR FLUORO GUIDE CV LINE LEFT  01/01/2018   IR FLUORO GUIDE CV LINE LEFT  03/24/2018    IR FLUORO GUIDE CV LINE LEFT  05/21/2018   IR FLUORO GUIDE CV LINE LEFT  07/09/2018   IR FLUORO GUIDE CV LINE LEFT  12/28/2020   IR FLUORO GUIDE CV LINE LEFT  07/02/2021   IR FLUORO GUIDE CV LINE LEFT  01/14/2022   IR FLUORO GUIDE CV LINE RIGHT  11/01/2020   IR GENERIC HISTORICAL  07/04/2016   IR REMOVAL TUN CV CATH W/O FL 07/04/2016 Ascencion Dike, PA-C [facility]   IR GENERIC HISTORICAL  07/10/2016   IR US GUIDE VASC ACCESS LEFT 07/10/2016 Arne Cleveland, MD [facility]   IR GENERIC HISTORICAL  07/10/2016   IR FLUORO GUIDE CV LINE LEFT 07/10/2016 Arne Cleveland, MD [facility]   IR PATIENT EVAL TECH 0-60 MINS  10/07/2017   IR PATIENT EVAL TECH 0-60 MINS  03/09/2018   IR RADIOLOGIST EVAL & MGMT  07/21/2017   IR RADIOLOGIST EVAL & MGMT  11/14/2020   IR RADIOLOGIST  EVAL & MGMT  11/27/2020   IR RADIOLOGIST EVAL & MGMT  01/24/2021   IR REMOVAL TUN CV CATH W/O FL  12/28/2020   IR TRANSCATH PLC STENT  INITIAL VEIN  INC ANGIOPLASTY  12/28/2020   IR US GUIDE VASC ACCESS LEFT  11/01/2020   IR US GUIDE VASC ACCESS LEFT  12/28/2020   IR US GUIDE VASC ACCESS LEFT  12/28/2020   IR US GUIDE VASC ACCESS LEFT  12/28/2020   IR US GUIDE VASC ACCESS RIGHT  11/01/2020   IR VENO/EXT/UNI LEFT  11/01/2020   IR VENOCAVAGRAM SVC  12/28/2020   LEFT HEART CATH AND CORONARY ANGIOGRAPHY N/A 06/26/2020   Procedure: LEFT HEART CATH AND CORONARY ANGIOGRAPHY;  Surgeon: Jolaine Artist, MD;  Location: [facility];  Service: Cardiovascular;  Laterality: N/A;   ORIF PROXIMAL FEMORAL FRACTURE W/ ITST NAIL SYSTEM  03/2007   left, Dr. Shellia Carwin   RIGHT HEART CATH N/A 01/01/2022   Procedure: RIGHT HEART CATH;  Surgeon: Jolaine Artist, MD;  Location: [facility];  Service: Cardiovascular;  Laterality: N/A;   SMALL INTESTINE SURGERY  2005   multiple with right colon resection for ischemia/infarct   TEE WITHOUT CARDIOVERSION N/A 01/14/2022   Procedure: TRANSESOPHAGEAL ECHOCARDIOGRAM (TEE);   Surgeon: [name], MD;  Location: [facility];  Service: Cardiology;  Laterality: N/A;    Current Medications: Current Meds  Medication Sig   acetaminophen (TYLENOL) 500 MG tablet Take 1,000 mg by mouth every 6 (six) hours as needed for mild pain.   ADULT TPN Inject 1,800 mLs into the vein See admin instructions. Pt receives home TPN from [facility]:  1800 mL bag, five nights weekly (Monday, Tuesday, Wednesday, Thursday and Friday for 8 hours (includes 1 hour taper up and down).   Calcium Carb-Cholecalciferol (CALCIUM + D3 PO) Take 1 tablet by mouth in the morning and at bedtime.   cetirizine (ZYRTEC) 10 MG tablet Take 10 mg by mouth daily as needed for allergies.   clobetasol ointment (TEMOVATE) 7.68 % Apply 1 application  topically 2 (two) times daily as needed (rash).   empagliflozin (JARDIANCE) 10 MG TABS tablet Take 1 tablet (10 mg total) by mouth daily before breakfast.   ergocalciferol (DRISDOL) 200 MCG/ML drops Take 6 mLs (48,000 Units total) by mouth 3 (three) times a week. (Patient taking differently: Take 48,000 Units by mouth every Monday, Wednesday, and Friday.)   furosemide (LASIX) 40 MG tablet Take 1 tablet (40 mg total) by mouth 2 (two) times daily. Morning & early afternoon. (Patient taking differently: Take 40 mg by mouth See admin instructions. 40 mg twice daily in the morning and early afternoon)   Heparin Sodium, Porcine, (HEPARIN LOCK FLUSH IJ) Inject 5 mLs as directed See admin instructions. 5 ml with TPN infusion on Monday,Tuesday,Wednesday, Thursday, Friday.   losartan (COZAAR) 25 MG tablet Take 0.5 tablets (12.5 mg total) by mouth at bedtime. Please resume only if your kidney function is stable. Your PCP or cardiologist should advice you. (Patient taking differently: Take 12.5 mg by mouth at bedtime.)   omeprazole (PRILOSEC) 40 MG capsule TAKE 1 CAPSULE BY MOUTH EVERY DAY BEFORE BREAKFAST (Patient taking differently: Take 40 mg by mouth daily.)   ondansetron  (ZOFRAN-ODT) 8 MG disintegrating tablet TAKE 1 TABLET BY MOUTH EVERY 8 HOURS AS NEEDED FOR NAUSEA OR VOMITING. (Patient taking differently: Take 8 mg by mouth every 8 (eight) hours as needed for vomiting or nausea.)   Pediatric Multivit-Minerals (FLINTSTONES GUMMIES  PO) Take 4 tablets by mouth in the morning.   potassium chloride SA (KLOR-CON M20) 20 MEQ tablet Take 1 tablet (20 mEq total) by mouth daily.   sodium chloride 0.9 % infusion Inject 900 mLs into the vein See admin instructions. 900 ml IV as needed for dehydration on TPN infusion days (Monday, Tuesday, Wednesday, Thursday, Friday)   spironolactone (ALDACTONE) 25 MG tablet TAKE 1 TABLET (25 MG TOTAL) BY MOUTH DAILY. (Patient taking differently: Take 25 mg by mouth daily.)   Teduglutide, rDNA, 5 MG KIT Inject 3.31 Units into the skin every morning.   vitamin E 1000 UNIT capsule Take 1,000 Units by mouth in the morning.   XARELTO 20 MG TABS tablet TAKE 1 TABLET BY MOUTH DAILY WITH SUPPER. (Patient taking differently: Take 20 mg by mouth daily with supper.)     Allergies:   Cefepime and Ivp dye [iodinated contrast media]   Social History   Socioeconomic History   Marital status: Married    Spouse name: Not on file   Number of children: 1   Years of education: Not on file   Highest education level: Not on file  Occupational History   Not on file  Tobacco Use   Smoking status: Never   Smokeless tobacco: Never  Vaping Use   Vaping Use: Never used  Substance and Sexual Activity   Alcohol use: Never   Drug use: Never   Sexual activity: Yes    Comment: 1st intercourse 68 yo-5 partners  Other Topics Concern   Not on file  Social History Narrative   ** Merged History Encounter **       Married to Herbie [place], has 1 daughter and a granddaughter the patient's son died in childhood due to a motor vehicle wreck 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 No alcohol tobacco or drug use 04/07/2017     Social Determinants of Health   Financial Resource Strain: Medium Risk (12/30/2017)   Overall Financial Resource Strain (CARDIA)    Difficulty of Paying Living Expenses: Somewhat hard  Food Insecurity: No Food Insecurity (01/22/2022)   Hunger Vital Sign    Worried About Running Out of Food in the Last Year: Never true    Ran Out of Food in the Last Year: Never true  Transportation Needs: No Transportation Needs (01/22/2022)   PRAPARE - Hydrologist (Medical): No    Lack of Transportation (Non-Medical): No  Physical Activity: Insufficiently Active (12/30/2017)   Exercise Vital Sign    Days of Exercise per Week: 7 days    Minutes of Exercise per Session: 20 min  Stress: No Stress Concern Present (12/30/2017)   [place]    Feeling of Stress : Not at all  Social Connections: Moderately Integrated (12/30/2017)   Social Connection and Isolation Panel [NHANES]    Frequency of Communication with Friends and Family: More than three times a week    Frequency of Social Gatherings with Friends and Family: Three times a week    Attends Religious Services: 1 to 4 times per year    Active Member of Clubs or Organizations: No    Attends Archivist Meetings: Never    Marital Status: Married     Family History: The patient's family history includes AAA (abdominal aortic aneurysm) in her mother; Diabetes in her mother; Hypertension in her mother. There is no history of Colon cancer or Stomach  cancer.  ROS:   Please see the history of present illness.    All other systems reviewed and are negative.  EKGs/Labs/Other Studies Reviewed:    The following studies were reviewed today: TEE 01/25/22: 1. Left ventricular ejection fraction, by estimation, is 30 to 35%. The  left ventricle has moderately decreased function. The left ventricular  internal cavity size was moderately dilated.   2. Right  ventricular systolic function is mildly reduced. The right  ventricular size is normal.   3. Left atrial size was severely dilated. No left atrial/left atrial  appendage thrombus was detected. The LAA emptying velocity was 52 cm/s.   4. Right atrial size was mildly dilated.   5. Mechanism of mitral valve regurgitation is likely posterior leaflet  restriction due to regional wall motion abnormality and atrial functional  MR. No pulmonary vein systolic reversals in left sided pulmonary veins,  however there are probable systolic  pulmonary vein reversals in the right sided pulmonary veins. The mitral  valve is grossly normal. Moderate to severe mitral valve regurgitation. No  evidence of mitral stenosis. Additionally, in some views the posterior  mitral annulus appears hypermobile.   6. The aortic valve is tricuspid. There is mild calcification of the  aortic valve. Aortic valve regurgitation is moderate. Mild aortic valve  stenosis. Aortic valve area, by VTI measures 1.41 cm. Aortic valve mean  gradient measures 10.0 mmHg. Aortic  valve Vmax measures 2.28 m/s.   7. There is Moderate (Grade III) atheroma plaque involving the aortic  arch and descending aorta.   8. Agitated saline contrast bubble study was positive with shunting  observed after >6 cardiac cycles suggestive of intrapulmonary shunting.   9. Indwelling catheter tip visualized in right atrium. No evidence of  vegetation.   Cardiac Cath: Findings:   RA = 9 RV = 58/10 PA = 56/23 (37) R PCW = 27 (v = 39) L PCW = 25 (v = 31) Fick cardiac output/index = 4.8/2.7 Thermo CO/CI = 4.7/2.7 PVR = 2.10 WU Ao sat = 98% PA sat = 62%, 61%   Assessment: 1. Elevated left-sided filling pressures with mild to moderate pulmonary venous HTN and normal output 2. Prominent v-waves in PCWP tracing suggestive of significant MR   Plan/Discussion:   Increase diuretics. F/u with Structural Heart team re: mTEER. Will need TEE as well.     Recent Labs: 01/22/2022: TSH 3.620 02/11/2022: ALT 27; B Natriuretic Peptide 2,029.1; BUN 30; Creatinine, Ser 1.58; Hemoglobin 10.8; Magnesium 2.3; Platelets 128; Potassium 4.5; Sodium 134  Recent Lipid Panel    Component Value Date/Time   CHOL 72 07/24/2020 0501   TRIG 142 02/05/2022 0450   HDL 17 (L) 07/24/2020 0501   CHOLHDL 4.2 07/24/2020 0501   VLDL 35 07/24/2020 0501   LDLCALC 20 07/24/2020 0501     Risk Assessment/Calculations:             Physical Exam:    VS:  BP 120/60 (BP Location: Left Arm, Patient Position: Sitting, Cuff Size: Normal)   Pulse (!) 56   Ht $R'5\' 8"'wk$  (1.727 m)   Wt 138 lb (62.6 kg)   LMP 02/26/2014   SpO2 98%   BMI 20.98 kg/m     Wt Readings from Last 3 Encounters:  02/11/22 137 lb 14.4 oz (62.6 kg)  02/11/22 138 lb (62.6 kg)  02/04/22 145 lb 13 oz (66.1 kg)     GEN:  Well nourished, well developed in no acute distress HEENT: Normal NECK:  No JVD; No carotid bruits LYMPHATICS: No lymphadenopathy CARDIAC: RRR, 2/6 HSM at the apex RESPIRATORY:  Clear to auscultation without rales, wheezing or rhonchi  ABDOMEN: Soft, non-tender, non-distended MUSCULOSKELETAL:  No edema; No deformity  SKIN: Warm and dry NEUROLOGIC:  Alert and oriented x 3 PSYCHIATRIC:  Normal affect   ASSESSMENT:    1. Pre-procedural cardiovascular examination   2. Severe mitral insufficiency    PLAN:    In order of problems listed above:  The patient has severe, symptomatic mitral regurgitation, grade 4+.  This is associated with NYHA functional class II symptoms of exertional dyspnea and chronic systolic heart failure with LVEF 30 to 35%.  She is on maximally tolerated medical therapy and has been cared for by the advanced heart failure team.  After multidisciplinary heart team review, we feel the patient is an appropriate candidate for transcatheter edge-to-edge repair of the mitral valve.  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  I have reviewed risks, indications, and alternatives, up to mitral valve repair via MitraClip.  The patient and her daughter were counseled at length and we discussed the complications can include but are not limited to vascular injury, bleeding, infection, arrhythmia, cardiac injury with tamponade, need for pericardiocentesis, emergency cardiac surgery, stroke, myocardial infarction, injury to the mitral valve, device embolization, single leaflet device attachment, late device infection, and death.  The patient understands that serious complications occur at a low risk of 1 to 2%.  Full informed consent is obtained.  The patient is scheduled for transcatheter edge-to-edge repair of the mitral valve 02/13/2022.           Medication Adjustments/Labs and Tests Ordered: Current medicines are reviewed at length with the patient today.  Concerns regarding medicines are outlined above.  Orders Placed This Encounter  Procedures   EKG 12-Lead   No orders of the defined types were placed in this encounter.   Patient Instructions  Medication Instructions:  HOLD Xarelto evening dose prior to procedure and day of procedure Your physician recommends that you continue on your current medications as directed. Please refer to the Current Medication list given to you today.  *If you need a refill on your cardiac medications before your next appointment, please call your pharmacy*   Lab Work: NONE If you have labs (blood work) drawn today and your tests are completely normal, you will receive your results only by: [place] (if you have MyChart) OR A paper copy in the mail If you have any lab test that is abnormal or we need to change your treatment, we will call you to review the results.   Testing/Procedures: MitraClip procedure   Follow-Up: At [facility], you and your health needs are our priority.  As  part of our continuing mission to provide you with exceptional heart care, we have created designated Provider Care Teams.  These Care Teams include your primary Cardiologist (physician) and Advanced Practice Providers (APPs -  Physician Assistants and Nurse Practitioners) who all work together to provide you with the care you need, when you need it.  We recommend signing up for the patient portal called "MyChart".  Sign up information is provided on this After Visit Summary.  MyChart is used to connect with patients for Virtual Visits (Telemedicine).  Patients are able to view lab/test results, encounter notes, upcoming appointments, etc.  Non-urgent messages can be sent to your provider  as well.   To learn more about what you can do with MyChart, go to [email or web address].    Your next appointment:   Structural Team will follow-up  The format for your next appointment:   In Person  Provider:   [name], MD  Other Instructions See separate instructions about MitraClip procedure  Important Information About Sugar         Signed, [name], MD  02/13/2022 5:28 AM    [employer]

## 2022-02-11 NOTE — Progress Notes (Signed)
Cardiology Office Note:    Date:  02/13/2022   ID:  [name] [name], DOB Jun 09, 1950, MRN [id number]  PCP:  [name], MD   [place] Providers Cardiologist:  None     Referring MD: [name], MD   Chief Complaint  Patient presents with   Follow-up    History of Present Illness:    [name] [name] is a 71 y.o. female presenting for follow-up of severe mitral regurgitation. The patient was initially evaluated January 03, 2022.  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  She has a history of stroke with residual left-sided weakness and has a known hypercoagulable disorder with multiple upper extremity venous occlusions.  Femoral veins have been demonstrated to be patent.  She was referred for evaluation of transcatheter edge-to-edge repair of the mitral valve in the setting of progressive and now severe mitral regurgitation.  The patient ambulates with a cane but gets around well and remains functionally independent.  She has developed heart failure symptoms with edema, shortness of breath, and orthopnea.  She was referred to the advanced heart failure clinic and was cared for by Dr. Haroldine Laws.  Medical therapy and diuretics were adjusted and she has done better from a cardiac perspective since that time.  She underwent right heart catheterization demonstrating elevated diastolic filling pressures and prominent V waves in the pulmonary wedge tracing suggestive of significant mitral regurgitation.  A transesophageal echo confirmed severe mitral regurgitation.  Her case was reviewed with the multidisciplinary heart valve team, including imaging specialist and cardiac surgery.  We agreed that she is an appropriate candidate for transcatheter edge-to-edge repair of the mitral valve.  She has already undergone evaluation by the advanced heart failure team as part of a  multidisciplinary approach to her care.  Since the patient's evaluation here in August, she was hospitalized with sepsis and developed neurotoxicity related to antibiotic therapy.  She recovered from this hospitalization and then was hospitalized overnight on September 26 after she got into an argument with her husband.  She took a handful of Benadryl as an apparent suicide attempt, but immediately had regret.  She had charcoal at home and took this, then called poison control, then went to the emergency department for further evaluation.  She was formally evaluated by psychiatry who felt that she was not a further harm to herself and she was discharged home the following day.  The patient is here with her daughter today.  She is regretful of her recent episode and states that "I do not know what got into me."  She states that she wants to live and would like to move forward with all treatment that could help her quality of life.  She is doing well at present and denies shortness of breath with her activities of daily living.  No chest pain, palpitations, edema, orthopnea, or PND.  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   Carotid stenosis    Carotid US (9/15):  R 40-59%;  L 1-39% >> FU 1 year   Cataract    Closed left subtrochanteric femur fracture (HCC) 12/31/2017   Clotting disorder ([place])    Congestive heart failure (CHF) (HCC)    EF 25-30%   COVID-19 2022   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   Fever 10/29/2020   Fracture of left clavicle    GERD (gastroesophageal reflux disease)    History of blood transfusion 2013    anemia   Hx of cardiovascular stress test    Myoview (9/15):  inf-apical scar; no ischemia; EF 47% - low risk    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 12/12/2011   Osteoporosis    Pancytopenia 10/07/2011   Pathologic fracture of neck of femur ([place])    Peripheral vascular disease ([place])    Personal history of colonic polyps    Renal insufficiency    hx of yrs ago   Serratia marcescens infection - bactermia assoc w/ PICC 01/18/2015   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[place])    Splenomegaly    By ultrasound   Stroke (cerebrum) ([place]) 07/25/2020   Stroke ([place]) 07/2020   Thrombophilia ([place])    Vitamin D deficiency     Past Surgical History:  Procedure Laterality Date   APPENDECTOMY  yrs ago   BUBBLE STUDY  01/14/2022   Procedure: BUBBLE STUDY;  Surgeon: [name], MD;  Location: [facility];  Service: Cardiology;;   CATARACT EXTRACTION Bilateral 2023   CHOLECYSTECTOMY  yrs ago   COLONOSCOPY  12/05/2005   internal hemorrhoids (for polyp surveillance)   COLONOSCOPY  04/26/2012   Procedure: COLONOSCOPY;  Surgeon: Gatha Mayer, MD;  [facility]: [facility];  Service: Endoscopy;  Laterality: N/A;   COLONOSCOPY WITH PROPOFOL N/A 03/18/2016   Procedure: COLONOSCOPY WITH PROPOFOL;  Surgeon: Gatha Mayer, MD;  [facility];  Service: Endoscopy;  Laterality: N/A;   ESOPHAGOGASTRODUODENOSCOPY  01/22/2009   erosive esophagitis   HARDWARE REMOVAL Left 12/31/2017   Procedure: HARDWARE REMOVAL;  Surgeon: Rod Can, MD;  [facility]: [facility];  Service: Orthopedics;  Laterality: Left;   INTRAMEDULLARY (IM) NAIL INTERTROCHANTERIC Left 12/31/2017   Procedure: INTRAMEDULLARY (IM) NAIL SUBTROCHANTRIC;  Surgeon: Rod Can, MD;  [facility]: [facility];  Service: Orthopedics;  Laterality: Left;   IR CV LINE INJECTION  03/23/2018   IR CV LINE INJECTION  10/02/2021   IR FLUORO GUIDE CV LINE LEFT  01/01/2018   IR FLUORO GUIDE CV LINE LEFT  03/24/2018    IR FLUORO GUIDE CV LINE LEFT  05/21/2018   IR FLUORO GUIDE CV LINE LEFT  07/09/2018   IR FLUORO GUIDE CV LINE LEFT  12/28/2020   IR FLUORO GUIDE CV LINE LEFT  07/02/2021   IR FLUORO GUIDE CV LINE LEFT  01/14/2022   IR FLUORO GUIDE CV LINE RIGHT  11/01/2020   IR GENERIC HISTORICAL  07/04/2016   IR REMOVAL TUN CV CATH W/O FL 07/04/2016 Ascencion Dike, PA-C [facility]   IR GENERIC HISTORICAL  07/10/2016   IR US GUIDE VASC ACCESS LEFT 07/10/2016 Arne Cleveland, MD [facility]   IR GENERIC HISTORICAL  07/10/2016   IR FLUORO GUIDE CV LINE LEFT 07/10/2016 Arne Cleveland, MD [facility]   IR PATIENT EVAL TECH 0-60 MINS  10/07/2017   IR PATIENT EVAL TECH 0-60 MINS  03/09/2018   IR RADIOLOGIST EVAL & MGMT  07/21/2017   IR RADIOLOGIST EVAL & MGMT  11/14/2020   IR RADIOLOGIST  EVAL & MGMT  11/27/2020   IR RADIOLOGIST EVAL & MGMT  01/24/2021   IR REMOVAL TUN CV CATH W/O FL  12/28/2020   IR TRANSCATH PLC STENT  INITIAL VEIN  INC ANGIOPLASTY  12/28/2020   IR US GUIDE VASC ACCESS LEFT  11/01/2020   IR US GUIDE VASC ACCESS LEFT  12/28/2020   IR US GUIDE VASC ACCESS LEFT  12/28/2020   IR US GUIDE VASC ACCESS LEFT  12/28/2020   IR US GUIDE VASC ACCESS RIGHT  11/01/2020   IR VENO/EXT/UNI LEFT  11/01/2020   IR VENOCAVAGRAM SVC  12/28/2020   LEFT HEART CATH AND CORONARY ANGIOGRAPHY N/A 06/26/2020   Procedure: LEFT HEART CATH AND CORONARY ANGIOGRAPHY;  Surgeon: Jolaine Artist, MD;  Location: [facility];  Service: Cardiovascular;  Laterality: N/A;   ORIF PROXIMAL FEMORAL FRACTURE W/ ITST NAIL SYSTEM  03/2007   left, Dr. Shellia Carwin   RIGHT HEART CATH N/A 01/01/2022   Procedure: RIGHT HEART CATH;  Surgeon: Jolaine Artist, MD;  Location: [facility];  Service: Cardiovascular;  Laterality: N/A;   SMALL INTESTINE SURGERY  2005   multiple with right colon resection for ischemia/infarct   TEE WITHOUT CARDIOVERSION N/A 01/14/2022   Procedure: TRANSESOPHAGEAL ECHOCARDIOGRAM (TEE);   Surgeon: [name], MD;  Location: [facility];  Service: Cardiology;  Laterality: N/A;    Current Medications: Current Meds  Medication Sig   acetaminophen (TYLENOL) 500 MG tablet Take 1,000 mg by mouth every 6 (six) hours as needed for mild pain.   ADULT TPN Inject 1,800 mLs into the vein See admin instructions. Pt receives home TPN from [facility]:  1800 mL bag, five nights weekly (Monday, Tuesday, Wednesday, Thursday and Friday for 8 hours (includes 1 hour taper up and down).   Calcium Carb-Cholecalciferol (CALCIUM + D3 PO) Take 1 tablet by mouth in the morning and at bedtime.   cetirizine (ZYRTEC) 10 MG tablet Take 10 mg by mouth daily as needed for allergies.   clobetasol ointment (TEMOVATE) 5.57 % Apply 1 application  topically 2 (two) times daily as needed (rash).   empagliflozin (JARDIANCE) 10 MG TABS tablet Take 1 tablet (10 mg total) by mouth daily before breakfast.   ergocalciferol (DRISDOL) 200 MCG/ML drops Take 6 mLs (48,000 Units total) by mouth 3 (three) times a week. (Patient taking differently: Take 48,000 Units by mouth every Monday, Wednesday, and Friday.)   furosemide (LASIX) 40 MG tablet Take 1 tablet (40 mg total) by mouth 2 (two) times daily. Morning & early afternoon. (Patient taking differently: Take 40 mg by mouth See admin instructions. 40 mg twice daily in the morning and early afternoon)   Heparin Sodium, Porcine, (HEPARIN LOCK FLUSH IJ) Inject 5 mLs as directed See admin instructions. 5 ml with TPN infusion on Monday,Tuesday,Wednesday, Thursday, Friday.   losartan (COZAAR) 25 MG tablet Take 0.5 tablets (12.5 mg total) by mouth at bedtime. Please resume only if your kidney function is stable. Your PCP or cardiologist should advice you. (Patient taking differently: Take 12.5 mg by mouth at bedtime.)   omeprazole (PRILOSEC) 40 MG capsule TAKE 1 CAPSULE BY MOUTH EVERY DAY BEFORE BREAKFAST (Patient taking differently: Take 40 mg by mouth daily.)   ondansetron  (ZOFRAN-ODT) 8 MG disintegrating tablet TAKE 1 TABLET BY MOUTH EVERY 8 HOURS AS NEEDED FOR NAUSEA OR VOMITING. (Patient taking differently: Take 8 mg by mouth every 8 (eight) hours as needed for vomiting or nausea.)   Pediatric Multivit-Minerals (FLINTSTONES GUMMIES  PO) Take 4 tablets by mouth in the morning.   potassium chloride SA (KLOR-CON M20) 20 MEQ tablet Take 1 tablet (20 mEq total) by mouth daily.   sodium chloride 0.9 % infusion Inject 900 mLs into the vein See admin instructions. 900 ml IV as needed for dehydration on TPN infusion days (Monday, Tuesday, Wednesday, Thursday, Friday)   spironolactone (ALDACTONE) 25 MG tablet TAKE 1 TABLET (25 MG TOTAL) BY MOUTH DAILY. (Patient taking differently: Take 25 mg by mouth daily.)   Teduglutide, rDNA, 5 MG KIT Inject 3.31 Units into the skin every morning.   vitamin E 1000 UNIT capsule Take 1,000 Units by mouth in the morning.   XARELTO 20 MG TABS tablet TAKE 1 TABLET BY MOUTH DAILY WITH SUPPER. (Patient taking differently: Take 20 mg by mouth daily with supper.)     Allergies:   Cefepime and Ivp dye [iodinated contrast media]   Social History   Socioeconomic History   Marital status: Married    Spouse name: Not on file   Number of children: 1   Years of education: Not on file   Highest education level: Not on file  Occupational History   Not on file  Tobacco Use   Smoking status: Never   Smokeless tobacco: Never  Vaping Use   Vaping Use: Never used  Substance and Sexual Activity   Alcohol use: Never   Drug use: Never   Sexual activity: Yes    Comment: 1st intercourse 62 yo-5 partners  Other Topics Concern   Not on file  Social History Narrative   ** Merged History Encounter **       Married to Herbie [place], has 1 daughter and a granddaughter the patient's son died in childhood due to a motor vehicle wreck 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 No alcohol tobacco or drug use 04/07/2017     Social Determinants of Health   Financial Resource Strain: Medium Risk (12/30/2017)   Overall Financial Resource Strain (CARDIA)    Difficulty of Paying Living Expenses: Somewhat hard  Food Insecurity: No Food Insecurity (01/22/2022)   Hunger Vital Sign    Worried About Running Out of Food in the Last Year: Never true    Ran Out of Food in the Last Year: Never true  Transportation Needs: No Transportation Needs (01/22/2022)   PRAPARE - Hydrologist (Medical): No    Lack of Transportation (Non-Medical): No  Physical Activity: Insufficiently Active (12/30/2017)   Exercise Vital Sign    Days of Exercise per Week: 7 days    Minutes of Exercise per Session: 20 min  Stress: No Stress Concern Present (12/30/2017)   [place]    Feeling of Stress : Not at all  Social Connections: Moderately Integrated (12/30/2017)   Social Connection and Isolation Panel [NHANES]    Frequency of Communication with Friends and Family: More than three times a week    Frequency of Social Gatherings with Friends and Family: Three times a week    Attends Religious Services: 1 to 4 times per year    Active Member of Clubs or Organizations: No    Attends Archivist Meetings: Never    Marital Status: Married     Family History: The patient's family history includes AAA (abdominal aortic aneurysm) in her mother; Diabetes in her mother; Hypertension in her mother. There is no history of Colon cancer or Stomach  cancer.  ROS:   Please see the history of present illness.    All other systems reviewed and are negative.  EKGs/Labs/Other Studies Reviewed:    The following studies were reviewed today: TEE 2022-02-11: 1. Left ventricular ejection fraction, by estimation, is 30 to 35%. The  left ventricle has moderately decreased function. The left ventricular  internal cavity size was moderately dilated.   2. Right  ventricular systolic function is mildly reduced. The right  ventricular size is normal.   3. Left atrial size was severely dilated. No left atrial/left atrial  appendage thrombus was detected. The LAA emptying velocity was 52 cm/s.   4. Right atrial size was mildly dilated.   5. Mechanism of mitral valve regurgitation is likely posterior leaflet  restriction due to regional wall motion abnormality and atrial functional  MR. No pulmonary vein systolic reversals in left sided pulmonary veins,  however there are probable systolic  pulmonary vein reversals in the right sided pulmonary veins. The mitral  valve is grossly normal. Moderate to severe mitral valve regurgitation. No  evidence of mitral stenosis. Additionally, in some views the posterior  mitral annulus appears hypermobile.   6. The aortic valve is tricuspid. There is mild calcification of the  aortic valve. Aortic valve regurgitation is moderate. Mild aortic valve  stenosis. Aortic valve area, by VTI measures 1.41 cm. Aortic valve mean  gradient measures 10.0 mmHg. Aortic  valve Vmax measures 2.28 m/s.   7. There is Moderate (Grade III) atheroma plaque involving the aortic  arch and descending aorta.   8. Agitated saline contrast bubble study was positive with shunting  observed after >6 cardiac cycles suggestive of intrapulmonary shunting.   9. Indwelling catheter tip visualized in right atrium. No evidence of  vegetation.   Cardiac Cath: Findings:   RA = 9 RV = 58/10 PA = 56/23 (37) R PCW = 27 (v = 39) L PCW = 25 (v = 31) Fick cardiac output/index = 4.8/2.7 Thermo CO/CI = 4.7/2.7 PVR = 2.10 WU Ao sat = 98% PA sat = 62%, 61%   Assessment: 1. Elevated left-sided filling pressures with mild to moderate pulmonary venous HTN and normal output 2. Prominent v-waves in PCWP tracing suggestive of significant MR   Plan/Discussion:   Increase diuretics. F/u with Structural Heart team re: mTEER. Will need TEE as well.     Recent Labs: 01/22/2022: TSH 3.620 02/11/2022: ALT 27; B Natriuretic Peptide 2,029.1; BUN 30; Creatinine, Ser 1.58; Hemoglobin 10.8; Magnesium 2.3; Platelets 128; Potassium 4.5; Sodium 134  Recent Lipid Panel    Component Value Date/Time   CHOL 72 07/24/2020 0501   TRIG 142 02/05/2022 0450   HDL 17 (L) 07/24/2020 0501   CHOLHDL 4.2 07/24/2020 0501   VLDL 35 07/24/2020 0501   LDLCALC 20 07/24/2020 0501     Risk Assessment/Calculations:             Physical Exam:    VS:  BP 120/60 (BP Location: Left Arm, Patient Position: Sitting, Cuff Size: Normal)   Pulse (!) 56   Ht $R'5\' 8"'nX$  (1.727 m)   Wt 138 lb (62.6 kg)   LMP 02/26/2014   SpO2 98%   BMI 20.98 kg/m     Wt Readings from Last 3 Encounters:  02/11/22 137 lb 14.4 oz (62.6 kg)  02/11/22 138 lb (62.6 kg)  02/04/22 145 lb 13 oz (66.1 kg)     GEN:  Well nourished, well developed in no acute distress HEENT: Normal NECK:  No JVD; No carotid bruits LYMPHATICS: No lymphadenopathy CARDIAC: RRR, 2/6 HSM at the apex RESPIRATORY:  Clear to auscultation without rales, wheezing or rhonchi  ABDOMEN: Soft, non-tender, non-distended MUSCULOSKELETAL:  No edema; No deformity  SKIN: Warm and dry NEUROLOGIC:  Alert and oriented x 3 PSYCHIATRIC:  Normal affect   ASSESSMENT:    1. Pre-procedural cardiovascular examination   2. Severe mitral insufficiency    PLAN:    In order of problems listed above:  The patient has severe, symptomatic mitral regurgitation, grade 4+.  This is associated with NYHA functional class II symptoms of exertional dyspnea and chronic systolic heart failure with LVEF 30 to 35%.  She is on maximally tolerated medical therapy and has been cared for by the advanced heart failure team.  After multidisciplinary heart team review, we feel the patient is an appropriate candidate for transcatheter edge-to-edge repair of the mitral valve.  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  I have reviewed risks, indications, and alternatives, up to mitral valve repair via MitraClip.  The patient and her daughter were counseled at length and we discussed the complications can include but are not limited to vascular injury, bleeding, infection, arrhythmia, cardiac injury with tamponade, need for pericardiocentesis, emergency cardiac surgery, stroke, myocardial infarction, injury to the mitral valve, device embolization, single leaflet device attachment, late device infection, and death.  The patient understands that serious complications occur at a low risk of 1 to 2%.  Full informed consent is obtained.  The patient is scheduled for transcatheter edge-to-edge repair of the mitral valve 02/13/2022.           Medication Adjustments/Labs and Tests Ordered: Current medicines are reviewed at length with the patient today.  Concerns regarding medicines are outlined above.  Orders Placed This Encounter  Procedures   EKG 12-Lead   No orders of the defined types were placed in this encounter.   Patient Instructions  Medication Instructions:  HOLD Xarelto evening dose prior to procedure and day of procedure Your physician recommends that you continue on your current medications as directed. Please refer to the Current Medication list given to you today.  *If you need a refill on your cardiac medications before your next appointment, please call your pharmacy*   Lab Work: NONE If you have labs (blood work) drawn today and your tests are completely normal, you will receive your results only by: [place] (if you have MyChart) OR A paper copy in the mail If you have any lab test that is abnormal or we need to change your treatment, we will call you to review the results.   Testing/Procedures: MitraClip procedure   Follow-Up: At [facility], you and your health needs are our priority.  As  part of our continuing mission to provide you with exceptional heart care, we have created designated Provider Care Teams.  These Care Teams include your primary Cardiologist (physician) and Advanced Practice Providers (APPs -  Physician Assistants and Nurse Practitioners) who all work together to provide you with the care you need, when you need it.  We recommend signing up for the patient portal called "MyChart".  Sign up information is provided on this After Visit Summary.  MyChart is used to connect with patients for Virtual Visits (Telemedicine).  Patients are able to view lab/test results, encounter notes, upcoming appointments, etc.  Non-urgent messages can be sent to your provider  as well.   To learn more about what you can do with MyChart, go to [email or web address].    Your next appointment:   Structural Team will follow-up  The format for your next appointment:   In Person  Provider:   [name], MD  Other Instructions See separate instructions about MitraClip procedure  Important Information About Sugar         Signed, [name], MD  02/13/2022 5:28 AM    [place]

## 2022-02-11 NOTE — Progress Notes (Signed)
PCP - Dr. Annye Asa Cardiologist - Dr. Glori Bickers  PPM/ICD - Denies Device Orders - n/a Rep Notified - n/a  Chest x-ray - 02/11/2022 EKG - 02/05/2022 Stress Test - 01/25/2014 ECHO - 01/14/2022 Cardiac Cath - 01/01/2022  Sleep Study - Denies CPAP - n/a  No DM. Pt is on diabetic medications for her short bowel syndrome.  Blood Thinner Instructions: Pt will hold Xarelto day before surgery per MD instructions Aspirin Instructions: n/a  NPO after midnight  COVID TEST- Yes. Results pending   Anesthesia review: Yes. Cardiac Hx.  Patient denies shortness of breath, fever, cough and chest pain at PAT appointment   All instructions explained to the patient, with a verbal understanding of the material. Patient agrees to go over the instructions while at home for a better understanding. Patient also instructed to self quarantine after being tested for COVID-19. The opportunity to ask questions was provided.

## 2022-02-12 ENCOUNTER — Telehealth: Payer: Self-pay

## 2022-02-12 NOTE — Anesthesia Preprocedure Evaluation (Addendum)
Anesthesia Evaluation  Patient identified by MRN, date of birth, ID band Patient awake    Reviewed: Allergy & Precautions, NPO status , Patient's Chart, lab work & pertinent test results  History of Anesthesia Complications Negative for: history of anesthetic complications  Airway Mallampati: II  TM Distance: >3 FB Neck ROM: Full    Dental  (+) Lower Dentures, Upper Dentures   Pulmonary shortness of breath, with exertion and lying,    Pulmonary exam normal        Cardiovascular + Peripheral Vascular Disease, +CHF and + DVT  + Valvular Problems/Murmurs MR and AI  Rhythm:Regular Rate:Normal + Systolic murmurs  '23 TEE - EF 30 to 35%. LV internal cavity size was moderately dilated. Right ventricular systolic function is mildly reduced. LA size was severely dilated. RA size was mildly dilated. Mechanism of mitral valve regurgitation is likely posterior leaflet restriction due to regional wall motion abnormality and atrial functional  MR. Moderate to severe mitral valve regurgitation. Aortic valve regurgitation is moderate. Mild aortic valve stenosis. Aortic valve area, by VTI measures 1.41 cm. Aortic valve mean gradient measures 10.0 mmHg. There is Moderate (Grade III) atheroma plaque involving the aortic arch and descending aorta. Agitated saline contrast bubble study was positive with shunting observed after >6 cardiac cycles suggestive of intrapulmonary shunting.     Neuro/Psych PSYCHIATRIC DISORDERS  Recent attempted OD with benadryl (followed by self administration of charcoal) per PAT note CVA, No Residual Symptoms    GI/Hepatic Neg liver ROS, GERD  Medicated and Controlled, Short bowel syndrome    Endo/Other  negative endocrine ROS  Renal/GU Renal InsufficiencyRenal disease     Musculoskeletal  (+) Arthritis ,  Carnitine deficiency    Abdominal   Peds  Hematology  (+) Blood dyscrasia, anemia ,  Plt 128k INR  1.6    Anesthesia Other Findings   Reproductive/Obstetrics                            Anesthesia Physical Anesthesia Plan  ASA: 4  Anesthesia Plan: General   Post-op Pain Management: Tylenol PO (pre-op)* and Minimal or no pain anticipated   Induction: Intravenous  PONV Risk Score and Plan: 3 and Treatment may vary due to age or medical condition, Ondansetron and Dexamethasone  Airway Management Planned: Oral ETT  Additional Equipment: Arterial line  Intra-op Plan:   Post-operative Plan: Extubation in OR  Informed Consent: I have reviewed the patients History and Physical, chart, labs and discussed the procedure including the risks, benefits and alternatives for the proposed anesthesia with the patient or authorized representative who has indicated his/her understanding and acceptance.     Dental advisory given  Plan Discussed with: CRNA and Anesthesiologist  Anesthesia Plan Comments:       Anesthesia Quick Evaluation

## 2022-02-12 NOTE — Progress Notes (Signed)
Anesthesia Chart Review:  Case: [id number] Date/Time: 02/13/22 0730   Procedures:      MITRAL VALVE REPAIR     TRANSESOPHAGEAL ECHOCARDIOGRAM (TEE)   Anesthesia type: General   Diagnosis: Severe mitral insufficiency [I34.0]   Pre-op diagnosis: severe mitral insufficiency   Location: [facility]ATH [facility] / [facility]   Providers: [name], MD       DISCUSSION: Patient is a 71 year old female scheduled for the above procedure.  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   Last [facility] admission 02/04/22-02/05/22 for acute on chronic systolic CHF with flash pulmonary edema, acute respiratory failure with hypoxia (requiring BiPAP) in setting of known moderate-severe MR and attempted OD with Benadryl (followed by self administration of activated charcoal).    Last seen by Dr. Burt Knack on 02/11/22. Note is not yet finalized. 02/11/2022 CXR in process. Anesthesia team to evaluate on the day of surgery.   VS: BP (!) 103/53   Pulse (!) 58   Temp 36.6 C (Oral)   Resp 18   Ht $R'5\' 8"'Bx$  (1.727 m)   Wt 62.6 kg   LMP 02/26/2014   SpO2 100%   BMI 20.97 kg/m    PROVIDERS: [name], MD is PCP  Glori Bickers, MD is HF cardiologist [name], MD is structural heart cardiologist Jenkins Rouge, MD is cardiologist Antony Contras, MD is neurologist Renata Caprice, MD is GI Servando Snare, MD is vascular surgeon   LABS: Preoperative labs reviewed.  (all labs ordered are listed, but only abnormal results are displayed)  Labs Reviewed  CBC - Abnormal; Notable for the following components:      Result Value   RBC 3.50 (*)    Hemoglobin 10.8 (*)    HCT 33.8 (*)    RDW 17.2 (*)    Platelets 128 (*)    All other components within normal  limits  COMPREHENSIVE METABOLIC PANEL - Abnormal; Notable for the following components:   Sodium 134 (*)    BUN 30 (*)    Creatinine, Ser 1.58 (*)    Total Protein 8.7 (*)    Albumin 2.9 (*)    Alkaline Phosphatase 150 (*)    GFR, Estimated 35 (*)    All other components within normal limits  PROTIME-INR - Abnormal; Notable for the following components:   Prothrombin Time 18.6 (*)    INR 1.6 (*)    All other components within normal limits  URINALYSIS, ROUTINE W REFLEX MICROSCOPIC - Abnormal; Notable for the following components:   APPearance HAZY (*)    Hgb urine dipstick SMALL (*)    Leukocytes,Ua SMALL (*)    Bacteria, UA RARE (*)    All other components within normal limits  BRAIN NATRIURETIC PEPTIDE - Abnormal; Notable for the following components:   B Natriuretic Peptide 2,029.1 (*)    All other components within normal limits  SARS CORONAVIRUS 2 (TAT 6-24 HRS)  SURGICAL PCR SCREEN  MAGNESIUM  PHOSPHORUS  PREALBUMIN  TYPE AND SCREEN     IMAGES: CXR 02/11/22: In process.  MRIA Head/Neck 01/11/22: IMPRESSION: MRA NECK IMPRESSION: 1. 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 2. Atheromatous irregularity about the left carotid bulb/proximal left ICA with associated stenoses of up to 30% by NASCET criteria. 3. 40% stenosis  at the origin of the right subclavian artery, proximal to the takeoff of the right vertebral artery. 4. Mild stenosis at the origin of the left vertebral artery. Left vertebral artery slightly dominant.   MRA HEAD IMPRESSION:  1. Wide patency of the intracranial arterial circulation. No large vessel occlusion. 2. Mild for age atheromatous irregularity about the carotid siphons. No hemodynamically significant or correctable stenosis. 3. 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 4. 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   MRI Brain 12/20/21: IMPRESSION: 1. Negative internal  auditory imaging. 2. Multifocal cerebral ischemia with extension since the March brain MRI last year. This includes late subacute to early chronic appearing bilateral PCA infarcts with developing encephalomalacia now in both occipital poles. 3. No superimposed acute intracranial abnormality.    EKG: 02/11/22 ([facility]): Sinus bradycardia at 56 bpm.  First-degree AV block with PACs.  Left bundle branch block.   CV: RHC 01/01/22: Findings: RA = 9 RV = 58/10 PA = 56/23 (37) R PCW = 27 (v = 39) L PCW = 25 (v = 31) Fick cardiac output/index = 4.8/2.7 Thermo CO/CI = 4.7/2.7 PVR = 2.10 WU Ao sat = 98% PA sat = 62%, 61%   Assessment: 1. Elevated left-sided filling pressures with mild to moderate pulmonary venous HTN and normal output 2. Prominent v-waves in PCWP tracing suggestive of significant MR   Plan/Discussion:  Increase diuretics. F/u with Structural Heart team re: mTEER. Will need TEE as well.    TEE 01/14/22: IMPRESSIONS   1. Left ventricular ejection fraction, by estimation, is 30 to 35%. The  left ventricle has moderately decreased function. The left ventricular  internal cavity size was moderately dilated.   2. Right ventricular systolic function is mildly reduced. The right  ventricular size is normal.   3. Left atrial size was severely dilated. No left atrial/left atrial  appendage thrombus was detected. The LAA emptying velocity was 52 cm/s.   4. Right atrial size was mildly dilated.   5. Mechanism of mitral valve regurgitation is likely posterior leaflet  restriction due to regional wall motion abnormality and atrial functional  MR. No pulmonary vein systolic reversals in left sided pulmonary veins,  however there are probable systolic  pulmonary vein reversals in the right sided pulmonary veins. The mitral  valve is grossly normal. Moderate to severe mitral valve regurgitation. No  evidence of mitral stenosis. Additionally, in some views the posterior   mitral annulus appears hypermobile.   6. The aortic valve is tricuspid. There is mild calcification of the  aortic valve. Aortic valve regurgitation is moderate. Mild aortic valve  stenosis. Aortic valve area, by VTI measures 1.41 cm. Aortic valve mean  gradient measures 10.0 mmHg. Aortic  valve Vmax measures 2.28 m/s.   7. There is Moderate (Grade III) atheroma plaque involving the aortic  arch and descending aorta.   8. Agitated saline contrast bubble study was positive with shunting  observed after >6 cardiac cycles suggestive of intrapulmonary shunting.   9. Indwelling catheter tip visualized in right atrium. No evidence of  vegetation.    MRI Cardiac 08/13/20: IMPRESSION: 1. Mild-moderately dilated left ventricle with severely decreased LV systolic function, LVEF 76%. Akinesis of the mid-apical inferior wall, apical septum and aneurysm of the LV apex. 2.  Normal right ventricular chamber size and function, RVEF 49%. 3.  No LV apical thrombus. 4. Finding suggest prior infarct with scar. Transmural LGE in mid inferior wall, and subendocardial  delayed enhancement in mid inferolateral wall, < 50% of myocardial thickness. Transmural delayed enhancement in inferoapex. Subendocardial and midmyocardial delayed enhancement in apical septum, >50% myocardial thickness. Aneurysmal apex demonstrates transmural delayed enhancement septal, inferior and lateral walls. These findings in combination suggest no viability in the mid inferior wall and LV apex.  5.  Qp/Qs 1.04 6. Patient aborted study early but data obtained is sufficient for diagnosis.    LHC 06/26/20: Dist RCA lesion is 30% stenosed. Mid Cx lesion is 30% stenosed. Prox LAD to Mid LAD lesion is 20% stenosed. Findings: 1. Mild nonobstructive CAD 2. LVEF 35-40% with large infero-apical aneurysm   Conclusion: Suspect possible previous coronary embolism or infract with vessel recannulization. Will get cMRI to further  characterize. D/w Dr. Johnsie Cancel.  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   Carnitine deficiency ([place]) 05/25/2018   Carotid stenosis    Carotid US (9/15):  R 40-59%; L 1-39% >> FU 1 year   Cataract    Closed left subtrochanteric femur fracture ([place]) 12/31/2017   Clotting disorder ([place])    Congestive heart failure (CHF) (HCC)    EF 25-30%   COVID-19 2022   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   Fever 10/29/2020   Fracture of left clavicle    GERD (gastroesophageal reflux disease)    History of blood transfusion 2013   anemia   Hx of cardiovascular stress test    Myoview (9/15):  inf-apical scar; no ischemia; EF 47% - low risk    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 12/12/2011   Osteoporosis    Pancytopenia 10/07/2011   Pathologic fracture of neck of femur ([place])    Peripheral vascular disease ([place])    Personal history of colonic polyps    Renal insufficiency    hx of yrs ago   Serratia marcescens infection - bactermia assoc w/ PICC 01/18/2015   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[place])    Splenomegaly    By ultrasound   Stroke (cerebrum) ([place]) 07/25/2020   Stroke ([place]) 07/2020   Thrombophilia ([place])    Vitamin D deficiency     Past Surgical History:  Procedure Laterality Date   APPENDECTOMY  yrs ago   BUBBLE STUDY  01/14/2022   Procedure: BUBBLE STUDY;  Surgeon: [name], MD;  Location: [facility];  Service: Cardiology;;   CATARACT EXTRACTION Bilateral 2023   CHOLECYSTECTOMY  yrs ago   COLONOSCOPY  12/05/2005   internal hemorrhoids (for polyp surveillance)   COLONOSCOPY  04/26/2012   Procedure:  COLONOSCOPY;  Surgeon: Gatha Mayer, MD;  Location: [facility];  Service: Endoscopy;  Laterality: N/A;   COLONOSCOPY WITH PROPOFOL N/A 03/18/2016   Procedure: COLONOSCOPY WITH PROPOFOL;  Surgeon: Gatha Mayer, MD;  Location: [facility];  Service: Endoscopy;  Laterality: N/A;   ESOPHAGOGASTRODUODENOSCOPY  01/22/2009   erosive esophagitis   HARDWARE REMOVAL Left 12/31/2017   Procedure: HARDWARE REMOVAL;  Surgeon: Rod Can, MD;  Location: [facility];  Service: Orthopedics;  Laterality: Left;   INTRAMEDULLARY (IM) NAIL INTERTROCHANTERIC Left 12/31/2017   Procedure: INTRAMEDULLARY (  IM) NAIL SUBTROCHANTRIC;  Surgeon: Rod Can, MD;  Location: [facility];  Service: Orthopedics;  Laterality: Left;   IR CV LINE INJECTION  03/23/2018   IR CV LINE INJECTION  10/02/2021   IR FLUORO GUIDE CV LINE LEFT  01/01/2018   IR FLUORO GUIDE CV LINE LEFT  03/24/2018   IR FLUORO GUIDE CV LINE LEFT  05/21/2018   IR FLUORO GUIDE CV LINE LEFT  07/09/2018   IR FLUORO GUIDE CV LINE LEFT  12/28/2020   IR FLUORO GUIDE CV LINE LEFT  07/02/2021   IR FLUORO GUIDE CV LINE LEFT  01/14/2022   IR FLUORO GUIDE CV LINE RIGHT  11/01/2020   IR GENERIC HISTORICAL  07/04/2016   IR REMOVAL TUN CV CATH W/O FL 07/04/2016 Ascencion Dike, PA-C [facility]   IR GENERIC HISTORICAL  07/10/2016   IR US GUIDE VASC ACCESS LEFT 07/10/2016 Arne Cleveland, MD [facility]   IR GENERIC HISTORICAL  07/10/2016   IR FLUORO GUIDE CV LINE LEFT 07/10/2016 Arne Cleveland, MD [facility]   IR PATIENT EVAL TECH 0-60 MINS  10/07/2017   IR PATIENT EVAL TECH 0-60 MINS  03/09/2018   IR RADIOLOGIST EVAL & MGMT  07/21/2017   IR RADIOLOGIST EVAL & MGMT  11/14/2020   IR RADIOLOGIST EVAL & MGMT  11/27/2020   IR RADIOLOGIST EVAL & MGMT  01/24/2021   IR REMOVAL TUN CV CATH W/O FL  12/28/2020   IR TRANSCATH PLC STENT  INITIAL VEIN  INC ANGIOPLASTY  12/28/2020   IR US GUIDE VASC ACCESS LEFT  11/01/2020   IR US GUIDE VASC ACCESS LEFT  12/28/2020   IR  US GUIDE VASC ACCESS LEFT  12/28/2020   IR US GUIDE VASC ACCESS LEFT  12/28/2020   IR US GUIDE VASC ACCESS RIGHT  11/01/2020   IR VENO/EXT/UNI LEFT  11/01/2020   IR VENOCAVAGRAM SVC  12/28/2020   LEFT HEART CATH AND CORONARY ANGIOGRAPHY N/A 06/26/2020   Procedure: LEFT HEART CATH AND CORONARY ANGIOGRAPHY;  Surgeon: Jolaine Artist, MD;  Location: [facility];  Service: Cardiovascular;  Laterality: N/A;   ORIF PROXIMAL FEMORAL FRACTURE W/ ITST NAIL SYSTEM  03/2007   left, Dr. Shellia Carwin   RIGHT HEART CATH N/A 01/01/2022   Procedure: RIGHT HEART CATH;  Surgeon: Jolaine Artist, MD;  Location: [facility];  Service: Cardiovascular;  Laterality: N/A;   SMALL INTESTINE SURGERY  2005   multiple with right colon resection for ischemia/infarct   TEE WITHOUT CARDIOVERSION N/A 01/14/2022   Procedure: TRANSESOPHAGEAL ECHOCARDIOGRAM (TEE);  Surgeon: [name], MD;  Location: [facility];  Service: Cardiology;  Laterality: N/A;    MEDICATIONS:  acetaminophen (TYLENOL) 500 MG tablet   ADULT TPN   Calcium Carb-Cholecalciferol (CALCIUM + D3 PO)   cetirizine (ZYRTEC) 10 MG tablet   clobetasol ointment (TEMOVATE) 0.05 %   empagliflozin (JARDIANCE) 10 MG TABS tablet   ergocalciferol (DRISDOL) 200 MCG/ML drops   furosemide (LASIX) 40 MG tablet   Heparin Sodium, Porcine, (HEPARIN LOCK FLUSH IJ)   losartan (COZAAR) 25 MG tablet   omeprazole (PRILOSEC) 40 MG capsule   ondansetron (ZOFRAN-ODT) 8 MG disintegrating tablet   Pediatric Multivit-Minerals (FLINTSTONES GUMMIES PO)   potassium chloride SA (KLOR-CON M20) 20 MEQ tablet   sodium chloride 0.9 % infusion   spironolactone (ALDACTONE) 25 MG tablet   Teduglutide, rDNA, 5 MG KIT   vitamin E 1000 UNIT capsule   XARELTO 20 MG TABS tablet   No current facility-administered  medications for this encounter.  Reported instructions to hold Xarelto the day before surgery.   Myra Gianotti, PA-C Surgical Short  Stay/Anesthesiology [facility] Phone [phone] [facility] Phone [phone] 02/12/2022 11:15 AM

## 2022-02-12 NOTE — Progress Notes (Signed)
patient's daughter voiced understanding of new arrival time of 75 tomorrow.

## 2022-02-12 NOTE — Telephone Encounter (Signed)
Most recent labs faxed to [facility]: Fax Number [phone]

## 2022-02-13 ENCOUNTER — Inpatient Hospital Stay (HOSPITAL_COMMUNITY): Payer: Medicare Other | Admitting: Certified Registered Nurse Anesthetist

## 2022-02-13 ENCOUNTER — Ambulatory Visit (HOSPITAL_COMMUNITY)
Admission: RE | Admit: 2022-02-13 | Discharge: 2022-02-13 | Disposition: A | Discharge status: Home / Self Care | Payer: Medicare Other | Attending: Cardiovascular Disease | Admitting: Cardiovascular Disease

## 2022-02-13 ENCOUNTER — Inpatient Hospital Stay (HOSPITAL_COMMUNITY): Payer: Medicare Other | Admitting: Vascular Surgery

## 2022-02-13 ENCOUNTER — Other Ambulatory Visit: Payer: Self-pay

## 2022-02-13 ENCOUNTER — Encounter (HOSPITAL_COMMUNITY): Payer: Self-pay | Admitting: Cardiovascular Disease

## 2022-02-13 ENCOUNTER — Inpatient Hospital Stay (HOSPITAL_BASED_OUTPATIENT_CLINIC_OR_DEPARTMENT_OTHER): Payer: Medicare Other

## 2022-02-13 ENCOUNTER — Encounter (HOSPITAL_COMMUNITY)
Admission: RE | Disposition: A | Discharge status: Home / Self Care | Payer: Self-pay | Source: Home / Self Care | Attending: Cardiovascular Disease

## 2022-02-13 DIAGNOSIS — I5022 Chronic systolic (congestive) heart failure: Secondary | ICD-10-CM | POA: Diagnosis not present

## 2022-02-13 DIAGNOSIS — Z8673 Personal history of transient ischemic attack (TIA), and cerebral infarction without residual deficits: Secondary | ICD-10-CM | POA: Insufficient documentation

## 2022-02-13 DIAGNOSIS — I2510 Atherosclerotic heart disease of native coronary artery without angina pectoris: Secondary | ICD-10-CM | POA: Insufficient documentation

## 2022-02-13 DIAGNOSIS — N1831 Chronic kidney disease, stage 3a: Secondary | ICD-10-CM | POA: Insufficient documentation

## 2022-02-13 DIAGNOSIS — M1990 Unspecified osteoarthritis, unspecified site: Secondary | ICD-10-CM | POA: Diagnosis not present

## 2022-02-13 DIAGNOSIS — I513 Intracardiac thrombosis, not elsewhere classified: Secondary | ICD-10-CM | POA: Insufficient documentation

## 2022-02-13 DIAGNOSIS — Z006 Encounter for examination for normal comparison and control in clinical research program: Secondary | ICD-10-CM | POA: Insufficient documentation

## 2022-02-13 DIAGNOSIS — I083 Combined rheumatic disorders of mitral, aortic and tricuspid valves: Secondary | ICD-10-CM | POA: Insufficient documentation

## 2022-02-13 DIAGNOSIS — I255 Ischemic cardiomyopathy: Secondary | ICD-10-CM | POA: Insufficient documentation

## 2022-02-13 DIAGNOSIS — K90829 Short bowel syndrome, unspecified: Secondary | ICD-10-CM | POA: Diagnosis not present

## 2022-02-13 DIAGNOSIS — I340 Nonrheumatic mitral (valve) insufficiency: Secondary | ICD-10-CM

## 2022-02-13 DIAGNOSIS — I080 Rheumatic disorders of both mitral and aortic valves: Secondary | ICD-10-CM

## 2022-02-13 DIAGNOSIS — Z7901 Long term (current) use of anticoagulants: Secondary | ICD-10-CM | POA: Diagnosis not present

## 2022-02-13 DIAGNOSIS — I509 Heart failure, unspecified: Secondary | ICD-10-CM

## 2022-02-13 DIAGNOSIS — I739 Peripheral vascular disease, unspecified: Secondary | ICD-10-CM | POA: Diagnosis not present

## 2022-02-13 HISTORY — PX: TEE WITHOUT CARDIOVERSION: SHX5443

## 2022-02-13 HISTORY — PX: TRANSCATHETER MITRAL EDGE TO EDGE REPAIR: CATH118311

## 2022-02-13 HISTORY — PX: MITRAL VALVE REPAIR: CATH118311

## 2022-02-13 LAB — ECHO TEE
MV M vel: 3.56 m/s
MV Peak grad: 50.7 mmHg

## 2022-02-13 SURGERY — MITRAL VALVE REPAIR
Anesthesia: General

## 2022-02-13 MED ORDER — HYDRALAZINE HCL 20 MG/ML IJ SOLN: 10.0000 mg | INTRAMUSCULAR | Status: DC | PRN

## 2022-02-13 MED ORDER — SODIUM CHLORIDE 0.9% FLUSH: 3.0000 mL | INTRAVENOUS | Status: DC | PRN

## 2022-02-13 MED ORDER — SODIUM CHLORIDE 0.9% FLUSH: 3.0000 mL | Freq: Two times a day (BID) | INTRAVENOUS | Status: DC

## 2022-02-13 MED ORDER — CHLORHEXIDINE GLUCONATE 4 % EX LIQD: 60.0000 mL | Freq: Once | CUTANEOUS | Status: DC

## 2022-02-13 MED ORDER — PROPOFOL 10 MG/ML IV BOLUS
INTRAVENOUS | Status: DC | PRN
  Administered 2022-02-13: 50 mg via INTRAVENOUS

## 2022-02-13 MED ORDER — RIVAROXABAN 20 MG PO TABS
20.0000 mg | ORAL_TABLET | Freq: Every day | ORAL | Status: DC
  Administered 2022-02-13: 20 mg via ORAL
  Filled 2022-02-13: qty 1

## 2022-02-13 MED ORDER — MIDAZOLAM HCL 2 MG/2ML IJ SOLN
INTRAMUSCULAR | Status: DC | PRN
  Administered 2022-02-13: 1 mg via INTRAVENOUS

## 2022-02-13 MED ORDER — ONDANSETRON HCL 4 MG/2ML IJ SOLN
INTRAMUSCULAR | Status: DC | PRN
  Administered 2022-02-13: 4 mg via INTRAVENOUS

## 2022-02-13 MED ORDER — VANCOMYCIN HCL IN DEXTROSE 1-5 GM/200ML-% IV SOLN
1000.0000 mg | INTRAVENOUS | Status: AC
  Administered 2022-02-13: 1000 mg via INTRAVENOUS
  Filled 2022-02-13: qty 200

## 2022-02-13 MED ORDER — ACETAMINOPHEN 500 MG PO TABS
1000.0000 mg | ORAL_TABLET | Freq: Once | ORAL | Status: AC
  Administered 2022-02-13: 1000 mg via ORAL
  Filled 2022-02-13: qty 2

## 2022-02-13 MED ORDER — HEPARIN (PORCINE) IN NACL 1000-0.9 UT/500ML-% IV SOLN
INTRAVENOUS | Status: DC | PRN
  Administered 2022-02-13: 500 mL

## 2022-02-13 MED ORDER — HEPARIN (PORCINE) IN NACL 2000-0.9 UNIT/L-% IV SOLN
INTRAVENOUS | Status: DC | PRN
  Administered 2022-02-13 (×3): 1000 mL

## 2022-02-13 MED ORDER — LIDOCAINE 2% (20 MG/ML) 5 ML SYRINGE
INTRAMUSCULAR | Status: DC | PRN
  Administered 2022-02-13: 40 mg via INTRAVENOUS

## 2022-02-13 MED ORDER — ONDANSETRON HCL 4 MG/2ML IJ SOLN: 4.0000 mg | Freq: Four times a day (QID) | INTRAMUSCULAR | Status: DC | PRN

## 2022-02-13 MED ORDER — DEXAMETHASONE SODIUM PHOSPHATE 10 MG/ML IJ SOLN
INTRAMUSCULAR | Status: DC | PRN
  Administered 2022-02-13: 5 mg via INTRAVENOUS

## 2022-02-13 MED ORDER — CHLORHEXIDINE GLUCONATE 4 % EX LIQD: 30.0000 mL | CUTANEOUS | Status: DC

## 2022-02-13 MED ORDER — FENTANYL CITRATE (PF) 250 MCG/5ML IJ SOLN
INTRAMUSCULAR | Status: DC | PRN
  Administered 2022-02-13 (×2): 50 ug via INTRAVENOUS

## 2022-02-13 MED ORDER — EPHEDRINE SULFATE (PRESSORS) 50 MG/ML IJ SOLN
INTRAMUSCULAR | Status: DC | PRN
  Administered 2022-02-13: 10 mg via INTRAVENOUS
  Administered 2022-02-13 (×2): 5 mg via INTRAVENOUS

## 2022-02-13 MED ORDER — SUGAMMADEX SODIUM 200 MG/2ML IV SOLN
INTRAVENOUS | Status: DC | PRN
  Administered 2022-02-13: 150 mg via INTRAVENOUS
  Administered 2022-02-13: 50 mg via INTRAVENOUS

## 2022-02-13 MED ORDER — CHLORHEXIDINE GLUCONATE 0.12 % MT SOLN
15.0000 mL | Freq: Once | OROMUCOSAL | Status: AC
  Administered 2022-02-13: 15 mL via OROMUCOSAL
  Filled 2022-02-13: qty 15

## 2022-02-13 MED ORDER — SODIUM CHLORIDE 0.9 % IV SOLN: 250.0000 mL | INTRAVENOUS | Status: DC | PRN

## 2022-02-13 MED ORDER — HEPARIN SODIUM (PORCINE) 1000 UNIT/ML IJ SOLN
INTRAMUSCULAR | Status: DC | PRN
  Administered 2022-02-13: 2000 [IU] via INTRAVENOUS
  Administered 2022-02-13: 6000 [IU] via INTRAVENOUS

## 2022-02-13 MED ORDER — LACTATED RINGERS IV SOLN: INTRAVENOUS | Status: DC | PRN

## 2022-02-13 MED ORDER — ACETAMINOPHEN 325 MG PO TABS: 650.0000 mg | ORAL_TABLET | ORAL | Status: DC | PRN

## 2022-02-13 MED ORDER — ROCURONIUM BROMIDE 10 MG/ML (PF) SYRINGE
PREFILLED_SYRINGE | INTRAVENOUS | Status: DC | PRN
  Administered 2022-02-13: 50 mg via INTRAVENOUS

## 2022-02-13 MED ORDER — LABETALOL HCL 5 MG/ML IV SOLN: 10.0000 mg | INTRAVENOUS | Status: DC | PRN

## 2022-02-13 MED ORDER — SODIUM CHLORIDE 0.9 % IV SOLN: INTRAVENOUS | Status: DC

## 2022-02-13 SURGICAL SUPPLY — 18 items
BAND CMPR LRG ZPHR (HEMOSTASIS) ×1
BAND ZEPHYR COMPRESS 30 LONG (HEMOSTASIS) IMPLANT
CATH MITRA STEERABLE GUIDE (CATHETERS) IMPLANT
CLOSURE PERCLOSE PROSTYLE (VASCULAR PRODUCTS) IMPLANT
KIT HEART LEFT (KITS) ×2 IMPLANT
KIT MICROPUNCTURE NIT STIFF (SHEATH) IMPLANT
KIT VERSACROSS LRG ACCESS (CATHETERS) IMPLANT
PACK CARDIAC CATHETERIZATION (CUSTOM PROCEDURE TRAY) ×1 IMPLANT
SHEATH DILAT COONS TAPER 22F (SHEATH) IMPLANT
SHEATH PINNACLE 8F 10CM (SHEATH) IMPLANT
SHEATH PROBE COVER 6X72 (BAG) ×1 IMPLANT
SHIELD RADPAD SCOOP 12X17 (MISCELLANEOUS) IMPLANT
STOPCOCK MORSE 400PSI 3WAY (MISCELLANEOUS) ×6 IMPLANT
SYSTEM MITRACLIP G4 (SYSTAGENIX WOUND MANAGEMENT) IMPLANT
TRANSDUCER W/STOPCOCK (MISCELLANEOUS) ×1 IMPLANT
TUBING ART PRESS 72  MALE/FEM (TUBING) ×1
TUBING ART PRESS 72 MALE/FEM (TUBING) ×1 IMPLANT
WIRE MICROINTRODUCER 60CM (WIRE) IMPLANT

## 2022-02-13 NOTE — Progress Notes (Signed)
TR BAND REMOVAL  LOCATION:  Left radial  DEFLATED PER PROTOCOL:    Yes.    TIME BAND OFF / DRESSING APPLIED:    1400 gauze dressing applied   SITE UPON ARRIVAL:    Level 0  SITE AFTER BAND REMOVAL:    Level 0  CIRCULATION SENSATION AND MOVEMENT:    Within Normal Limits   Yes.    COMMENTS:   no new issues noted

## 2022-02-13 NOTE — Interval H&P Note (Signed)
History and Physical Interval Note:  02/13/2022 5:30 AM  [name] [name]  has presented today for surgery, with the diagnosis of severe mitral insufficiency.  The various methods of treatment have been discussed with the patient and family. After consideration of risks, benefits and other options for treatment, the patient has consented to  Procedure(s): MITRAL VALVE REPAIR (N/A) TRANSESOPHAGEAL ECHOCARDIOGRAM (TEE) (N/A) as a surgical intervention.  The patient's history has been reviewed, patient examined, no change in status, stable for surgery.  I have reviewed the patient's chart and labs.  Questions were answered to the patient's satisfaction.     [name]

## 2022-02-13 NOTE — Anesthesia Postprocedure Evaluation (Signed)
Anesthesia Post Note  Patient: [name] [name]  Procedure(s) Performed: MITRAL VALVE REPAIR TRANSESOPHAGEAL ECHOCARDIOGRAM (TEE)     Patient location during evaluation: PACU Anesthesia Type: General Level of consciousness: awake and alert Pain management: pain level controlled Vital Signs Assessment: post-procedure vital signs reviewed and stable Respiratory status: spontaneous breathing, nonlabored ventilation and respiratory function stable Cardiovascular status: stable and blood pressure returned to baseline Anesthetic complications: no   There were no known notable events for this encounter.  Last Vitals:  Vitals:   02/13/22 1030 02/13/22 1035  BP: (!) 106/40 (!) 106/40  Pulse: (!) 58 60  Resp: 19 14  Temp:    SpO2: 94% 100%    Last Pain:  Vitals:   02/13/22 0947  TempSrc:   PainSc: 0-No pain                 Audry Pili

## 2022-02-13 NOTE — Transfer of Care (Signed)
Immediate Anesthesia Transfer of Care Note  Patient: [name] [name]  Procedure(s) Performed: MITRAL VALVE REPAIR TRANSESOPHAGEAL ECHOCARDIOGRAM (TEE)  Patient Location: Cath Lab  Anesthesia Type:General  Level of Consciousness: awake, alert  and oriented  Airway & Oxygen Therapy: Patient Spontanous Breathing  Post-op Assessment: Report given to RN, Post -op Vital signs reviewed and stable and Patient moving all extremities  Post vital signs: Reviewed and stable  Last Vitals:  Vitals Value Taken Time  BP 125/54 02/13/22 0938  Temp    Pulse 60 02/13/22 0942  Resp 16 02/13/22 0942  SpO2 94 % 02/13/22 0942  Vitals shown include unvalidated device data.  Last Pain:  Vitals:   02/13/22 8295  TempSrc:   PainSc: 0-No pain         Complications: There were no known notable events for this encounter.

## 2022-02-13 NOTE — Discharge Instructions (Signed)

## 2022-02-13 NOTE — Anesthesia Procedure Notes (Signed)
Arterial Line Insertion Start/End10/09/2021 7:10 AM, 02/13/2022 7:35 AM Performed by: Audry Pili, MD, anesthesiologist  Patient location: Pre-op. Preanesthetic checklist: patient identified, IV checked, risks and benefits discussed, surgical consent, monitors and equipment checked, pre-op evaluation, timeout performed and anesthesia consent Lidocaine 1% used for infiltration and patient sedated Left, radial was placed Catheter size: 20 G Hand hygiene performed   Attempts: 3 (Previous attempts by CRNA on right radial artery unsuccessful) Procedure performed using ultrasound guided technique. Ultrasound Notes:anatomy identified, needle tip was noted to be adjacent to the nerve/plexus identified and no ultrasound evidence of intravascular and/or intraneural injection Post procedure complications: unsuccessful attempts and second provider assisted. Patient tolerated the procedure well with no immediate complications. Additional procedure comments: First attempts by CRNA on right radial artery unsuccessful. An attempt with ultrasound by Dr. Fransisco Beau on left radial artery also unsuccessful. Dr. Burt Knack in room during last attempt, agreed to place arterial line in procedure room.Marland Kitchen

## 2022-02-13 NOTE — Discharge Summary (Signed)
[place] VALVE TEAM  Discharge Summary    Patient ID: [name] [name] MRN: [id number]; DOB: [birth year]  Admit date: 02/13/2022 Discharge date: 02/13/2022  Primary Care Provider: [name], MD  Primary Cardiologist: Dr. Haroldine Laws, MD/ Dr. Burt Knack, MD and Dr. Ali Lowe, MD (MitraClip attempt)  Discharge Diagnoses    Principal Problem:   Severe mitral regurgitation Active Problems:   Acquired short bowel syndrome   Chronic systolic heart failure (HCC) - LVEF 35-40%   History of CVA (cerebrovascular accident)   Stage 3a chronic kidney disease (CKD) (HCC)  Allergies Allergies  Allergen Reactions   Cefepime Other (See Comments)    Severe encephalopathy   Ivp Dye [Iodinated Contrast Media] Itching    Can use if taking benadryl    Diagnostic Studies/Procedures    Attempted TEER 02/13/22:  Aborted transcatheter edge-to-edge repair of the mitral valve due to development of thrombus on the right atrial side of the interatrial septum.   Plan: Resume Xarelto as soon as the patient is able to swallow p.o.  Anticipate discharge later today.  Continue uninterrupted rivaroxaban and likely treat mitral regurgitation medically.  If we do reattempt MitraClip, the procedure will have to be done with the patient fully anticoagulated (no interruption of rivaroxaban).  Plan and procedural course was discussed with the patient's daughter, husband, and the patient following the procedure.  History of Present Illness     [name] [name] is a 71 y.o. 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   [name] [name] was initially evaluated 12/14/21. She has a  complex medical history with acquired short-bowel syndrome after bowel resection and she has been on chronic TPN for many years. She also has a hx of stroke with residual left-sided weakness and has a known hypercoagulable disorder with multiple upper extremity venous occlusions. She was referred for evaluation of transcatheter edge-to-edge repair of the mitral valve in the setting of severe mitral regurgitation. She ambulates with a cane but gets around well and remains functionally independent.  More recently she began developing heart failure symptoms with edema, shortness of breath, and orthopnea. Given this, she was referred to the advanced heart failure clinic and was seen for by Dr. Haroldine Laws. Her medical therapy and diuretics were adjusted which has helped her stabilize.   She was referred for a [facility] which showed elevated diastolic filling pressures and prominent V waves in the pulmonary wedge tracing suggestive of significant mitral regurgitation. Subsequent TEE confirmed severe mitral regurgitation. Her case was reviewed with the multidisciplinary heart valve team, including imaging specialist and cardiac surgery with recommendations to pursue TEER with MitraClip placement. This was scheduled for 02/13/22.   Unfortunately, after initial consultation she was hospitalized with sepsis and developed neurotoxicity related to antibiotic therapy. She recovered from this hospitalization and was discharged home. She then was seen in the ED 02/04/22 after an argument with her husband after which she took a handful of Benadryl as an apparent suicide attempt, but immediately had regret. She had charcoal at home and took this, then called poison control and proceeded to the ED for further evaluation. She was formally evaluated by psychiatry who felt that she was not a further harm to herself and  she was discharged home the following day.    She was re-evaluated by Dr. Burt Knack and continued to be regretful of her  prior decision. She confirmed that she wanted to proceed with MitraClip in an attempt to improve her quality of life. TEER date remained 02/13/22.   Hospital Course     Severe mitral regurgitation: Unfortunately, case was aborted due to development of thrombus on the right atrial side of the interatrial septum. She was resumed on Xarelto in cath lab holding soon after case end. Continue uninterrupted rivaroxaban and likely treat mitral regurgitation medically. If we do reattempt MitraClip, the procedure will have to be done with the patient fully anticoagulated (no interruption of rivaroxaban). Plan and procedural course was discussed with the patient's daughter, husband, and the patient following the procedure. She developed post aline radial hematoma and bruising. See plan below.   Right radial hematoma/bruising: Patient developed post catheter hematoma and bruising. TR band placed and air removal guidelines followed. Positive Allen's test. Continues to have bruising up the right forearm however site is soft and painless. Has follow up with myself 10/16. Will complete post procedure TOC call tomorrow to check on her.   Systolic CHF/ischemic cardiomyopathy: TEE 01/14/22 with LVEF at 30-35%. Follows closely with AHF team.   Non-obstructive CAD: Last [facility] 06/26/20 with no occlusive disease with dRCA 30%, mLCx 30%, and pLAD/mLAD at 20%. Continue current regimen with   Short gut syndrome with chronic TPN: Pharmacy consulted for TPN dosing while inpatient post TEER.  Aortic stenosis/aortic regurgitation: Most recent TEE with moderate AR with mild aortic stenosis with an AVA by VTI at 1.41cm2, mean gradient at 14mHg. Continue with surveillance imaging.   Prior CVA: Follows closely with Dr. SLeonie Man Continue current regimen   Consultants:None ____________  Discharge Vitals Blood pressure 111/61, pulse (!) 57, temperature 98.3 F (36.8 C), resp. rate 20, height _0  (1.727 m), weight 63.5 kg, last  menstrual period 02/26/2014, SpO2 99 %.  Filed Weights   02/13/22 0545  Weight: 63.5 kg   Labs & Radiologic Studies    CBC Recent Labs    02/11/22 1330  WBC 5.5  HGB 10.8*  HCT 33.8*  MCV 96.6  PLT 1884   Basic Metabolic Panel Recent Labs    02/11/22 1330  NA 134*  K 4.5  CL 98  CO2 25  GLUCOSE 93  BUN 30*  CREATININE 1.58*  CALCIUM 9.0  MG 2.3  PHOS 3.3   Liver Function Tests Recent Labs    02/11/22 1330  AST 36  ALT 27  ALKPHOS 150*  BILITOT 1.1  PROT 8.7*  ALBUMIN 2.9*   No results for input(s): "LIPASE", "AMYLASE" in the last 72 hours. Cardiac Enzymes No results for input(s): "CKTOTAL", "CKMB", "CKMBINDEX", "TROPONINI" in the last 72 hours. BNP Invalid input(s): "POCBNP" D-Dimer No results for input(s): "DDIMER" in the last 72 hours. Hemoglobin A1C No results for input(s): "HGBA1C" in the last 72 hours. Fasting Lipid Panel No results for input(s): "CHOL", "HDL", "LDLCALC", "TRIG", "CHOLHDL", "LDLDIRECT" in the last 72 hours. Thyroid Function Tests No results for input(s): "TSH", "T4TOTAL", "T3FREE", "THYROIDAB" in the last 72 hours.  Invalid input(s): "FREET3" _____________  CARDIAC CATHETERIZATION  Result Date: 02/13/2022 Aborted transcatheter edge-to-edge repair of the mitral valve due to development of thrombus on the right atrial side of the interatrial septum. Plan: Resume Xarelto as soon as the patient is able to swallow p.o.  Anticipate discharge later today.  Continue uninterrupted rivaroxaban and likely treat  mitral regurgitation medically.  If we do reattempt MitraClip, the procedure will have to be done with the patient fully anticoagulated (no interruption of rivaroxaban).  Plan and procedural course was discussed with the patient's daughter, husband, and the patient following the procedure.   DG CHEST PORT 1 VIEW  Result Date: 02/05/2022 CLINICAL DATA:  Acute respiratory failure with hypoxia. EXAM: PORTABLE CHEST 1 VIEW COMPARISON:   February 04, 2022. FINDINGS: Stable cardiomegaly. Left internal jugular catheter is unchanged. Significantly decreased bibasilar opacities are noted suggesting improving atelectasis or edema. The visualized skeletal structures are unremarkable. IMPRESSION: Significantly decreased bibasilar opacities are noted suggesting improving atelectasis or edema. Electronically Signed   By: Marijo Conception M.D.   On: 02/05/2022 12:13   DG Chest Port 1 View  Result Date: 02/04/2022 CLINICAL DATA:  Benadryl ingestion EXAM: PORTABLE CHEST 1 VIEW COMPARISON:  Radiographs 01/21/2022 FINDINGS: Increased bilateral hazy airspace and interstitial opacities greatest in the lower lungs compared to 01/21/2022. Remainder unchanged. Stable cardiomegaly. No pleural effusion or pneumothorax. No acute osseous abnormality. Aortic atherosclerotic calcification. Left IJ CVC tip in the right atrium. IMPRESSION: Hazy airspace and interstitial opacities favored to represent edema. Electronically Signed   By: Placido Sou M.D.   On: 02/04/2022 20:00   Overnight EEG with video  Result Date: 01/23/2022 Lora Havens, MD     01/23/2022  1:16 PM Patient Name: Kathren C Lansberry MRN: [id number] Epilepsy Attending: Lora Havens Referring Physician/Provider: Janine Ores, NP Duration: 01/22/2022 1651 to 01/23/2022 1154  Patient history: 71yo F with ams. EEG to evaluate for seizure  Level of alertness: lethargic  AEDs during EEG study: None  Technical aspects: This EEG study was done with scalp electrodes positioned according to the 10-20 International system of electrode placement. Electrical activity was reviewed with band pass filter of 1-_0 , sensitivity of 7 uV/mm, display speed of 41m/sec with a _1  notched filter applied as appropriate. EEG data were recorded continuously and digitally stored.  Video monitoring was available and reviewed as appropriate.  Description: EEG initially showed continuous generalized 3-_2  theta-delta slowing.  Gradually eeg improved and showed predominantly 5-_3  theta-alpha activity admixed with 2-_4  delta slowing.  Generalized periodic discharges with triphasic morphology were also noted at 1-2.5 Hz which gradually improved to 1-1._5 , more prominent when awake/stimulated.  Hyperventilation and photic stimulation were not performed.  Patient event button was pressed on 01/22/2022 at 1713 for patient being confused and at 1730 and 1743 or patient crying. Concomitant eeg before, during and after the event didn't show any eeg change to suggest seizure,   ABNORMALITY - Periodic discharges with triphasic morphology, generalized ( GPDs) - Continuous slow, generalized  IMPRESSION: This study showed generalized periodic discharges with triphasic morphology which gradually improved and were most likely due to toxic-metabolic causes like cefepime toxicity.  Additionally there is moderate diffuse encephalopathy. No definite seizures were seen during the study.  Patient event button was pressed on 01/22/2022 at 1713 for patient being confused and at 1730 and 1743 or patient crying without concomitant eeg change. These events were NOT epileptic.  PLora Havens   EEG adult  Result Date: 01/22/2022 YLora Havens MD     01/23/2022  9:41 AM Patient Name: Arzu C Verdone MRN: [id number] Attending: PLora HavensReferring Physician/Provider: SJanine Ores NP Date: 01/22/2022 Duration: 28.49 mins Patient history: 89yoF with ams. EEG to evaluate for seizure Level of alertness: Awake AEDs during EEG study: Ativan Technical aspects: This EEG study was  done with scalp electrodes positioned according to the 10-20 International system of electrode placement. Electrical activity was reviewed with band pass filter of 1-_0 , sensitivity of 7 uV/mm, display speed of 45m/sec with a _1  notched filter applied as appropriate. EEG data were recorded continuously and digitally stored.  Video monitoring was available and reviewed as  appropriate. Description: EEG showed continuous generalized 3-_2  theta-delta slowing.  Generalized periodic discharges with triphasic morphology were noted at 1-2.5 Hz.  Hyperventilation and photic stimulation were not performed.    ABNORMALITY - Periodic discharges with triphasic morphology, generalized ( GPDs) - Continuous slow, generalized  IMPRESSION: This study showed generalized periodic discharges with triphasic morphology. This pattern can be seen due to toxic-metabolic causes like cefepime toxicity.  However, due to the frequency,would recommend LTM monitoring if clinically indicated.   Additionally there is moderate to severe diffuse encephalopathy. No definite seizures were seen during the study.  PLora Havens  CT Angio Chest/Abd/Pel for Dissection W and/or Wo Contrast  Result Date: 01/21/2022 CLINICAL DATA:  Chest and back pain. EXAM: CT ANGIOGRAPHY CHEST, ABDOMEN AND PELVIS TECHNIQUE: Non-contrast CT of the chest was initially obtained. Multidetector CT imaging through the chest, abdomen and pelvis was performed using the standard protocol during bolus administration of intravenous contrast. Multiplanar reconstructed images and MIPs were obtained and reviewed to evaluate the vascular anatomy. RADIATION DOSE REDUCTION: This exam was performed according to the departmental dose-optimization program which includes automated exposure control, adjustment of the mA and/or kV according to patient size and/or use of iterative reconstruction technique. CONTRAST:  1062mOMNIPAQUE IOHEXOL 350 MG/ML SOLN COMPARISON:  CT PE protocol 12/09/2021 FINDINGS: CTA CHEST FINDINGS Cardiovascular: No evidence for aortic dissection or aneurysm. There are mild calcified atherosclerotic plaque throughout the aorta. There is no large central pulmonary embolism. The heart is moderately enlarged. There is no pericardial effusion. Left-sided central venous catheter ends in the right atrium. Mediastinum/Nodes: No enlarged  mediastinal, hilar, or axillary lymph nodes. Thyroid gland, trachea, and esophagus demonstrate no significant findings. Lungs/Pleura: There is a ground-glass nodular density in the right upper lobe measuring 7 mm image 11/27. There is minimal atelectasis in the lung bases. The lungs are otherwise clear. No pleural effusion or pneumothorax. Musculoskeletal: No chest wall abnormality. No acute or significant osseous findings. Review of the MIP images confirms the above findings. CTA ABDOMEN AND PELVIS FINDINGS VASCULAR Aorta: Normal caliber aorta without aneurysm, dissection, vasculitis or significant stenosis. There are atherosclerotic calcifications throughout the aorta. Celiac: Patent without evidence of aneurysm, dissection, vasculitis or significant stenosis. SMA: Patent without evidence of aneurysm, dissection, vasculitis or significant stenosis. Renals: Limited evaluation secondary to artifact from patient's arms and respiratory motion artifact. Grossly patent bilaterally. IMA: Not well seen. Inflow: Patent without evidence of aneurysm, dissection, vasculitis or significant stenosis. Veins: No obvious venous abnormality within the limitations of this arterial phase study. Review of the MIP images confirms the above findings. NON-VASCULAR Hepatobiliary: No focal liver abnormality is seen. Status post cholecystectomy. No biliary dilatation. Pancreas: Unremarkable. No pancreatic ductal dilatation or surrounding inflammatory changes. Spleen: Mildly enlarged. Adrenals/Urinary Tract: Rounded hypodensity in the left kidney is too small to characterize, likely a cyst. Otherwise, the kidneys, adrenal glands and bladder are within normal limits. Stomach/Bowel: The colon is fluid filled. The cecum is midline and mildly dilated. Questionable stricture seen just proximal to the terminal ileum. No wall thickening or surrounding inflammation identified. The appendix is not seen. There is sigmoid colon diverticulosis. Small  bowel and stomach are  nondilated. No free air. Lymphatic: No enlarged lymph nodes. Reproductive: There is a 4.8 x 4.5 cm left adnexal cystic area. Right adnexa and uterus appear within normal limits. Other: No ascites or focal abdominal wall hernia. Musculoskeletal: No acute or significant osseous findings. Review of the MIP images confirms the above findings. IMPRESSION: 1. No evidence for aortic dissection or aneurysm. 2. Moderate cardiomegaly. 3. No acute cardiopulmonary process. 4. The cecum is dilated and fluid-filled and there is questionable stricture or mass within the colon just proximal to the terminal ileum. Recommend clinical correlation and follow-up. 5. Mild splenomegaly. 6. Subcentimeter left Bosniak 2 lesion, too small to characterize. No follow-up imaging is recommended. JACR 2018 Feb; 264-273, Management of the Incidental RenalMass on CT, [employer] 2021; 814-848, Bosniak Classification of Cystic Renal Masses, Version 2019. 7. 7 mm right ground-glass pulmonary nodule within the upper lobe. Per Fleischner Society Guidelines, recommend a non-contrast Chest CT at 6-12 months to confirm persistence, then additional non-contrast Chest CTs every 2 years until 5 years. If nodule grows or develops solid component(s), consider resection. These guidelines do not apply to immunocompromised patients and patients with cancer. Follow up in patients with significant comorbidities as clinically warranted. For lung cancer screening, adhere to Lung-RADS guidelines. Reference: Radiology. 2017; 284(1):228-43. 8. 4.8 cm left adnexal simple-appearing cyst. Recommend follow-up pelvic US in 6-12 months. Reference: JACR 2020 Feb;17(2):248-254 Electronically Signed   By: Ronney Asters M.D.   On: 01/21/2022 17:26   DG Chest Port 1 View  Result Date: 01/21/2022 CLINICAL DATA:  Mental status changes. Possible sepsis. Recent exchange tunneled central venous catheter. EXAM: PORTABLE CHEST 1 VIEW COMPARISON:  12/09/2021  FINDINGS: Stable cardiac enlargement. Left jugular tunneled central line present with the catheter tip at the SVC/RA junction. There is no evidence of pulmonary edema, consolidation, pneumothorax, nodule or pleural fluid. IMPRESSION: Stable cardiac enlargement and positioning of tunneled left jugular central venous catheter. Electronically Signed   By: Aletta Edouard M.D.   On: 01/21/2022 14:16   CT Head Wo Contrast  Result Date: 01/21/2022 CLINICAL DATA:  Mental status changes of unknown cause, confusion EXAM: CT HEAD WITHOUT CONTRAST TECHNIQUE: Contiguous axial images were obtained from the base of the skull through the vertex without intravenous contrast. RADIATION DOSE REDUCTION: This exam was performed according to the departmental dose-optimization program which includes automated exposure control, adjustment of the mA and/or kV according to patient size and/or use of iterative reconstruction technique. COMPARISON:  CT head 07/23/2020 FINDINGS: Brain: Generalized atrophy. Normal ventricular morphology. No midline shift or mass effect. Small vessel chronic ischemic changes of deep cerebral white matter. No intracranial hemorrhage, mass lesion, evidence of acute infarction, or extra-axial fluid collection. Vascular: No hyperdense vessels. Atherosclerotic calcification of internal carotid and vertebral arteries at skull base Skull: Demineralized but intact Sinuses/Orbits: Clear Other: N/A IMPRESSION: Atrophy with small vessel chronic ischemic changes of deep cerebral white matter. No acute intracranial abnormalities. Electronically Signed   By: Lavonia Dana M.D.   On: 01/21/2022 13:53   Disposition   Pt is being discharged home today in good condition.  Follow-up Plans & Appointments    Follow-up Information     Tommie Raymond, NP Follow up on 02/24/2022.   Specialty: Cardiology Why: @ 12pm. Please arrive at 11:45 Contact information: [place]  [address] [phone]                Discharge Instructions     Call MD for:  difficulty  breathing, headache or visual disturbances   Complete by: As directed    Call MD for:  extreme fatigue   Complete by: As directed    Call MD for:  hives   Complete by: As directed    Call MD for:  persistant dizziness or light-headedness   Complete by: As directed    Call MD for:  persistant nausea and vomiting   Complete by: As directed    Call MD for:  redness, tenderness, or signs of infection (pain, swelling, redness, odor or green/yellow discharge around incision site)   Complete by: As directed    Call MD for:  severe uncontrolled pain   Complete by: As directed    Call MD for:  temperature >100.4   Complete by: As directed    Diet - low sodium heart healthy   Complete by: As directed    Discharge instructions   Complete by: As directed    MitraClip office number: [phone]   Increase activity slowly   Complete by: As directed       Discharge Medications   Allergies as of 02/13/2022       Reactions   Cefepime Other (See Comments)   Severe encephalopathy   Ivp Dye [iodinated Contrast Media] Itching   Can use if taking benadryl         Medication List     TAKE these medications    acetaminophen 500 MG tablet Commonly known as: TYLENOL Take 1,000 mg by mouth every 6 (six) hours as needed for mild pain.   ADULT TPN Inject 1,800 mLs into the vein See admin instructions. Pt receives home TPN from [facility]:  1800 mL bag, five nights weekly (Monday, Tuesday, Wednesday, Thursday and Friday for 8 hours (includes 1 hour taper up and down).   CALCIUM + D3 PO Take 1 tablet by mouth in the morning and at bedtime.   cetirizine 10 MG tablet Commonly known as: ZYRTEC Take 10 mg by mouth daily as needed for allergies.   clobetasol ointment 0.05 % Commonly known as: TEMOVATE Apply 1 application  topically 2 (two) times daily as needed (rash).   empagliflozin 10 MG  Tabs tablet Commonly known as: Jardiance Take 1 tablet (10 mg total) by mouth daily before breakfast.   ergocalciferol (VITAMIN D2) 200 MCG/ML drops Commonly known as: DRISDOL Take 6 mLs (48,000 Units total) by mouth 3 (three) times a week. What changed: when to take this   FLINTSTONES GUMMIES PO Take 4 tablets by mouth in the morning.   furosemide 40 MG tablet Commonly known as: LASIX Take 1 tablet (40 mg total) by mouth 2 (two) times daily. Morning & early afternoon. What changed:  when to take this additional instructions   HEPARIN LOCK FLUSH IJ Inject 5 mLs as directed See admin instructions. 5 ml with TPN infusion on Monday,Tuesday,Wednesday, Thursday, Friday.   losartan 25 MG tablet Commonly known as: COZAAR Take 0.5 tablets (12.5 mg total) by mouth at bedtime. Please resume only if your kidney function is stable. Your PCP or cardiologist should advice you. What changed: additional instructions   omeprazole 40 MG capsule Commonly known as: PRILOSEC TAKE 1 CAPSULE BY MOUTH EVERY DAY BEFORE BREAKFAST What changed: See the new instructions.   ondansetron 8 MG disintegrating tablet Commonly known as: ZOFRAN-ODT TAKE 1 TABLET BY MOUTH EVERY 8 HOURS AS NEEDED FOR NAUSEA OR VOMITING. What changed: reasons to take this   potassium chloride SA 20 MEQ tablet Commonly known as:  Klor-Con M20 Take 1 tablet (20 mEq total) by mouth daily.   sodium chloride 0.9 % infusion Inject 900 mLs into the vein See admin instructions. 900 ml IV as needed for dehydration on TPN infusion days (Monday, Tuesday, Wednesday, Thursday, Friday)   spironolactone 25 MG tablet Commonly known as: ALDACTONE TAKE 1 TABLET (25 MG TOTAL) BY MOUTH DAILY.   Teduglutide (rDNA) 5 MG Kit Inject 3.31 Units into the skin every morning.   vitamin E 1000 UNIT capsule Take 1,000 Units by mouth in the morning.   Xarelto 20 MG Tabs tablet Generic drug: rivaroxaban TAKE 1 TABLET BY MOUTH DAILY WITH  SUPPER. What changed: how much to take         Outstanding Labs/Studies   None   Duration of Discharge Encounter   Greater than 30 minutes including physician time.  SignedKathyrn Drown, NP 02/13/2022, 1:34 PM [phone]

## 2022-02-13 NOTE — Anesthesia Procedure Notes (Signed)
Procedure Name: Intubation Date/Time: 02/13/2022 8:06 AM  Performed by: Amadeo Garnet, CRNAPre-anesthesia Checklist: Patient identified, Emergency Drugs available, Suction available and Patient being monitored Patient Re-evaluated:Patient Re-evaluated prior to induction Oxygen Delivery Method: Circle system utilized Preoxygenation: Pre-oxygenation with 100% oxygen Induction Type: IV induction Ventilation: Mask ventilation without difficulty Laryngoscope Size: Mac and 3 Grade View: Grade I Tube type: Oral Tube size: 8.0 mm Number of attempts: 1 Airway Equipment and Method: Stylet and Oral airway Placement Confirmation: ETT inserted through vocal cords under direct vision, positive ETCO2 and breath sounds checked- equal and bilateral Secured at: 21 cm Tube secured with: Tape Dental Injury: Teeth and Oropharynx as per pre-operative assessment

## 2022-02-14 ENCOUNTER — Encounter (HOSPITAL_COMMUNITY): Payer: Self-pay | Admitting: Cardiovascular Disease

## 2022-02-14 DIAGNOSIS — G928 Other toxic encephalopathy: Secondary | ICD-10-CM | POA: Diagnosis not present

## 2022-02-14 DIAGNOSIS — I5042 Chronic combined systolic (congestive) and diastolic (congestive) heart failure: Secondary | ICD-10-CM | POA: Diagnosis not present

## 2022-02-14 DIAGNOSIS — T361X5D Adverse effect of cephalosporins and other beta-lactam antibiotics, subsequent encounter: Secondary | ICD-10-CM | POA: Diagnosis not present

## 2022-02-14 DIAGNOSIS — G9341 Metabolic encephalopathy: Secondary | ICD-10-CM | POA: Diagnosis not present

## 2022-02-14 DIAGNOSIS — K639 Disease of intestine, unspecified: Secondary | ICD-10-CM | POA: Diagnosis not present

## 2022-02-14 DIAGNOSIS — K912 Postsurgical malabsorption, not elsewhere classified: Secondary | ICD-10-CM | POA: Diagnosis not present

## 2022-02-14 LAB — POCT ACTIVATED CLOTTING TIME: Activated Clotting Time: 209 seconds

## 2022-02-17 ENCOUNTER — Telehealth: Payer: Self-pay

## 2022-02-17 NOTE — Telephone Encounter (Signed)
  [facility] VASCULAR [facility]    Contacted the patient regarding discharge from [facility] on 02/13/2022 after aborted MitraClip procedure after developing thrombus.  The patient understands to follow up with Kathyrn Drown on 02/24/2022.  The patient understands discharge instructions? Yes  The patient understands medications and regimen? Yes   The patient understands to call with any questions or concerns prior to scheduled visit.

## 2022-02-18 ENCOUNTER — Telehealth: Payer: Self-pay | Admitting: Family Medicine

## 2022-02-18 NOTE — Telephone Encounter (Signed)
Received Home Health Certification and plan of Care forms from [facility]. Placed in front bin with charge sheet.

## 2022-02-18 NOTE — Telephone Encounter (Signed)
Placed forms in Dr Birdie Riddle to be signed folder . Once signed I will fax back

## 2022-02-19 ENCOUNTER — Encounter: Payer: Self-pay | Admitting: Hematology and Oncology

## 2022-02-20 NOTE — Telephone Encounter (Signed)
Form completed and returned to [place] 

## 2022-02-20 NOTE — Telephone Encounter (Signed)
Taken from [place] and faxed back, placed in faxed folder by fax machine

## 2022-02-21 DIAGNOSIS — I5042 Chronic combined systolic (congestive) and diastolic (congestive) heart failure: Secondary | ICD-10-CM | POA: Diagnosis not present

## 2022-02-21 DIAGNOSIS — T361X5D Adverse effect of cephalosporins and other beta-lactam antibiotics, subsequent encounter: Secondary | ICD-10-CM | POA: Diagnosis not present

## 2022-02-21 DIAGNOSIS — K639 Disease of intestine, unspecified: Secondary | ICD-10-CM | POA: Diagnosis not present

## 2022-02-21 DIAGNOSIS — K912 Postsurgical malabsorption, not elsewhere classified: Secondary | ICD-10-CM | POA: Diagnosis not present

## 2022-02-21 DIAGNOSIS — G9341 Metabolic encephalopathy: Secondary | ICD-10-CM | POA: Diagnosis not present

## 2022-02-21 DIAGNOSIS — G928 Other toxic encephalopathy: Secondary | ICD-10-CM | POA: Diagnosis not present

## 2022-02-23 DIAGNOSIS — K639 Disease of intestine, unspecified: Secondary | ICD-10-CM | POA: Diagnosis not present

## 2022-02-23 DIAGNOSIS — M810 Age-related osteoporosis without current pathological fracture: Secondary | ICD-10-CM | POA: Diagnosis not present

## 2022-02-23 DIAGNOSIS — I351 Nonrheumatic aortic (valve) insufficiency: Secondary | ICD-10-CM | POA: Diagnosis not present

## 2022-02-23 DIAGNOSIS — T361X5D Adverse effect of cephalosporins and other beta-lactam antibiotics, subsequent encounter: Secondary | ICD-10-CM | POA: Diagnosis not present

## 2022-02-23 DIAGNOSIS — I255 Ischemic cardiomyopathy: Secondary | ICD-10-CM | POA: Diagnosis not present

## 2022-02-23 DIAGNOSIS — D631 Anemia in chronic kidney disease: Secondary | ICD-10-CM | POA: Diagnosis not present

## 2022-02-23 DIAGNOSIS — E7140 Disorder of carnitine metabolism, unspecified: Secondary | ICD-10-CM | POA: Diagnosis not present

## 2022-02-23 DIAGNOSIS — Z9181 History of falling: Secondary | ICD-10-CM | POA: Diagnosis not present

## 2022-02-23 DIAGNOSIS — E559 Vitamin D deficiency, unspecified: Secondary | ICD-10-CM | POA: Diagnosis not present

## 2022-02-23 DIAGNOSIS — H269 Unspecified cataract: Secondary | ICD-10-CM | POA: Diagnosis not present

## 2022-02-23 DIAGNOSIS — K219 Gastro-esophageal reflux disease without esophagitis: Secondary | ICD-10-CM | POA: Diagnosis not present

## 2022-02-23 DIAGNOSIS — D6859 Other primary thrombophilia: Secondary | ICD-10-CM | POA: Diagnosis not present

## 2022-02-23 DIAGNOSIS — G9389 Other specified disorders of brain: Secondary | ICD-10-CM | POA: Diagnosis not present

## 2022-02-23 DIAGNOSIS — I447 Left bundle-branch block, unspecified: Secondary | ICD-10-CM | POA: Diagnosis not present

## 2022-02-23 DIAGNOSIS — K2970 Gastritis, unspecified, without bleeding: Secondary | ICD-10-CM | POA: Diagnosis not present

## 2022-02-23 DIAGNOSIS — N1831 Chronic kidney disease, stage 3a: Secondary | ICD-10-CM | POA: Diagnosis not present

## 2022-02-23 DIAGNOSIS — D696 Thrombocytopenia, unspecified: Secondary | ICD-10-CM | POA: Diagnosis not present

## 2022-02-23 DIAGNOSIS — I5042 Chronic combined systolic (congestive) and diastolic (congestive) heart failure: Secondary | ICD-10-CM | POA: Diagnosis not present

## 2022-02-23 DIAGNOSIS — K912 Postsurgical malabsorption, not elsewhere classified: Secondary | ICD-10-CM | POA: Diagnosis not present

## 2022-02-23 DIAGNOSIS — R911 Solitary pulmonary nodule: Secondary | ICD-10-CM | POA: Diagnosis not present

## 2022-02-23 DIAGNOSIS — I6523 Occlusion and stenosis of bilateral carotid arteries: Secondary | ICD-10-CM | POA: Diagnosis not present

## 2022-02-23 DIAGNOSIS — G9341 Metabolic encephalopathy: Secondary | ICD-10-CM | POA: Diagnosis not present

## 2022-02-23 DIAGNOSIS — I340 Nonrheumatic mitral (valve) insufficiency: Secondary | ICD-10-CM | POA: Diagnosis not present

## 2022-02-23 DIAGNOSIS — G928 Other toxic encephalopathy: Secondary | ICD-10-CM | POA: Diagnosis not present

## 2022-02-23 DIAGNOSIS — E46 Unspecified protein-calorie malnutrition: Secondary | ICD-10-CM | POA: Diagnosis not present

## 2022-02-24 ENCOUNTER — Ambulatory Visit: Payer: Medicare Other | Attending: Cardiovascular Disease | Admitting: Cardiology

## 2022-02-24 VITALS — BP 108/62 | Ht 68.0 in | Wt 137.4 lb

## 2022-02-24 DIAGNOSIS — D6859 Other primary thrombophilia: Secondary | ICD-10-CM | POA: Insufficient documentation

## 2022-02-24 DIAGNOSIS — I340 Nonrheumatic mitral (valve) insufficiency: Secondary | ICD-10-CM | POA: Diagnosis not present

## 2022-02-24 DIAGNOSIS — I2510 Atherosclerotic heart disease of native coronary artery without angina pectoris: Secondary | ICD-10-CM | POA: Insufficient documentation

## 2022-02-24 DIAGNOSIS — I5022 Chronic systolic (congestive) heart failure: Secondary | ICD-10-CM | POA: Insufficient documentation

## 2022-02-24 DIAGNOSIS — K90829 Short bowel syndrome, unspecified: Secondary | ICD-10-CM | POA: Diagnosis not present

## 2022-02-24 DIAGNOSIS — I6523 Occlusion and stenosis of bilateral carotid arteries: Secondary | ICD-10-CM

## 2022-02-24 NOTE — Patient Instructions (Addendum)
Medication Instructions:   Your physician recommends that you continue on your current medications as directed. Please refer to the Current Medication list given to you today.  *If you need a refill on your cardiac medications before your next appointment, please call your pharmacy*    Follow-Up:  As scheduled with Dr. Haroldine Laws on 03/18/22 at 10 am--please arrive 15 mins prior to this appointment for check-in   Important Information About Sugar

## 2022-02-24 NOTE — Progress Notes (Signed)
HEART AND [place]                                     Cardiology Office Note:    Date:  02/24/2022   ID:  [name] [name], DOB 03/07/1951, MRN [id number]  PCP:  [name], MD  [facility] Cardiologist:  None  [facility] Electrophysiologist:  None   Referring MD: [name], MD   Chief Complaint  Patient presents with   Follow-up     Follow up s/p failed MitraClip    History of Present Illness:    [name] [name] is a 71 y.o. 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    [name] [name] was initially evaluated 12/14/21. 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 She also has a hx of stroke with residual left-sided weakness and has a known hypercoagulable disorder with multiple upper extremity venous occlusions. She was referred for evaluation of transcatheter edge-to-edge repair of the mitral valve in the setting of severe mitral regurgitation. She ambulates with a cane but gets around well and remains functionally independent.  More recently she began developing heart failure symptoms with edema, shortness of breath, and orthopnea. Given this, she was referred to the advanced heart failure clinic and was seen for by Dr. Haroldine Laws. Her medical therapy and diuretics were adjusted which has helped her stabilize.    She was referred for a [facility] which showed elevated diastolic filling pressures and prominent V waves in the pulmonary wedge tracing suggestive of significant mitral regurgitation. Subsequent TEE confirmed severe mitral  regurgitation. Her case was reviewed with the multidisciplinary heart valve team, including imaging specialist and cardiac surgery with recommendations to pursue TEER with MitraClip placement. This was scheduled for 02/13/22.    Unfortunately, after initial consultation she was hospitalized with sepsis and developed neurotoxicity related to antibiotic therapy. She recovered from this hospitalization and was discharged home. She then was seen in the ED 02/04/22 after an argument with her husband after which she took a handful of Benadryl as an apparent suicide attempt, but immediately had regret. She had charcoal at home and took this, then called poison control and proceeded to the ED for further evaluation. She was formally evaluated by psychiatry who felt that she was not a further harm to herself and she was discharged home the following day.     She was re-evaluated by Dr. Burt Knack and continued to be regretful of her prior decision. She confirmed that she wanted to proceed with MitraClip in an attempt to improve her quality of life. TEER date remained 02/13/22.    Unfortunately, her case was aborted due to development of thrombus on the right atrial side of the interatrial septum at the start of her procedure. She was resumed on Xarelto in cath lab holding with plans to continue uninterrupted rivaroxaban and likely treat mitral regurgitation medically. She developed post aline radial hematoma and bruising.  Today she is here with her husband and reports that she has been well since discharge. Left  forearm is bruised but stable with no s/s of hematoma. She denies chest pain, change in SOB, palpitations, orthopnea, LE edema, dizziness, or syncope. No bleeding in stool or urine. Overall she feels like she is doing well given her circumstances. She is unsure if she would like to proceed with further workup or possible re-try at MitraClip given her stability. Dr. Burt Knack in the office today and came to see her.  Plan will be to keep close monitoring on her for worsening symptoms and re-address at that time. She has close follow up with Dr. Haroldine Laws next month.   Past Medical History:  Diagnosis Date   Abnormal LFTs 2013   Allergic rhinosinusitis    Allergy    Anemia of chronic disease    Arthritis    At risk for dental problems    Atypical nevus    Bacteremia 10/27/2020   Bacteremia due to Klebsiella pneumoniae 12/12/2011   Bacterial overgrowth syndrome    Brachial vein thrombus, left ([place]) 10/08/2012   Carnitine deficiency ([place]) 05/25/2018   Carotid stenosis    Carotid US (9/15):  R 40-59%; L 1-39% >> FU 1 year   Cataract    Closed left subtrochanteric femur fracture ([place]) 12/31/2017   Clotting disorder ([place])    Congestive heart failure (CHF) (HCC)    EF 25-30%   COVID-19 2022   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   Fever 10/29/2020   Fracture of left clavicle    GERD (gastroesophageal reflux disease)    History of blood transfusion 2013   anemia   Hx of cardiovascular stress test    Myoview (9/15):  inf-apical scar; no ischemia; EF 47% - low risk    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 12/12/2011   Osteoporosis    Pancytopenia 10/07/2011   Pathologic fracture of neck of femur ([place])    Peripheral vascular disease ([place])    Personal history of colonic polyps    Renal insufficiency    hx of yrs ago   Serratia marcescens infection - bactermia assoc w/ PICC 01/18/2015   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[place])    Splenomegaly    By ultrasound   Stroke (cerebrum) ([place]) 07/25/2020   Stroke ([place]) 07/2020   Thrombophilia ([place])    Vitamin D deficiency     Past Surgical History:  Procedure Laterality Date   APPENDECTOMY  yrs ago   BUBBLE STUDY  01/14/2022   Procedure: BUBBLE STUDY;  Surgeon: [name], MD;  Location: [place];  Service: Cardiology;;   CATARACT EXTRACTION Bilateral 2023   CHOLECYSTECTOMY  yrs  ago   COLONOSCOPY  12/05/2005   internal hemorrhoids (for polyp surveillance)   COLONOSCOPY  04/26/2012   Procedure: COLONOSCOPY;  Surgeon: Gatha Mayer, MD;  Location: [facility];  Service: Endoscopy;  Laterality: N/A;   COLONOSCOPY WITH PROPOFOL N/A 03/18/2016   Procedure: COLONOSCOPY WITH PROPOFOL;  Surgeon: Gatha Mayer, MD;  [facility]: [facility];  Service: Endoscopy;  Laterality: N/A;   ESOPHAGOGASTRODUODENOSCOPY  01/22/2009   erosive esophagitis   HARDWARE REMOVAL Left 12/31/2017   Procedure: HARDWARE REMOVAL;  Surgeon: Rod Can, MD;  Location: [facility];  Service: Orthopedics;  Laterality: Left;   INTRAMEDULLARY (IM) NAIL INTERTROCHANTERIC Left 12/31/2017   Procedure: INTRAMEDULLARY (IM) NAIL SUBTROCHANTRIC;  Surgeon: Rod Can, MD;  [facility]: [facility];  Service: Orthopedics;  Laterality: Left;   IR CV LINE INJECTION  03/23/2018   IR CV  LINE INJECTION  10/02/2021   IR FLUORO GUIDE CV LINE LEFT  01/01/2018   IR FLUORO GUIDE CV LINE LEFT  03/24/2018   IR FLUORO GUIDE CV LINE LEFT  05/21/2018   IR FLUORO GUIDE CV LINE LEFT  07/09/2018   IR FLUORO GUIDE CV LINE LEFT  12/28/2020   IR FLUORO GUIDE CV LINE LEFT  07/02/2021   IR FLUORO GUIDE CV LINE LEFT  01/14/2022   IR FLUORO GUIDE CV LINE RIGHT  11/01/2020   IR GENERIC HISTORICAL  07/04/2016   IR REMOVAL TUN CV CATH W/O FL 07/04/2016 Ascencion Dike, PA-C [facility]   IR GENERIC HISTORICAL  07/10/2016   IR US GUIDE VASC ACCESS LEFT 07/10/2016 Arne Cleveland, MD [facility]   IR GENERIC HISTORICAL  07/10/2016   IR FLUORO GUIDE CV LINE LEFT 07/10/2016 Arne Cleveland, MD [facility]   IR PATIENT EVAL TECH 0-60 MINS  10/07/2017   IR PATIENT EVAL TECH 0-60 MINS  03/09/2018   IR RADIOLOGIST EVAL & MGMT  07/21/2017   IR RADIOLOGIST EVAL & MGMT  11/14/2020   IR RADIOLOGIST EVAL & MGMT  11/27/2020   IR RADIOLOGIST EVAL & MGMT  01/24/2021   IR REMOVAL TUN CV CATH W/O FL  12/28/2020   IR TRANSCATH PLC STENT  INITIAL VEIN   INC ANGIOPLASTY  12/28/2020   IR US GUIDE VASC ACCESS LEFT  11/01/2020   IR US GUIDE VASC ACCESS LEFT  12/28/2020   IR US GUIDE VASC ACCESS LEFT  12/28/2020   IR US GUIDE VASC ACCESS LEFT  12/28/2020   IR US GUIDE VASC ACCESS RIGHT  11/01/2020   IR VENO/EXT/UNI LEFT  11/01/2020   IR VENOCAVAGRAM SVC  12/28/2020   LEFT HEART CATH AND CORONARY ANGIOGRAPHY N/A 06/26/2020   Procedure: LEFT HEART CATH AND CORONARY ANGIOGRAPHY;  Surgeon: Jolaine Artist, MD;  Location: [place] [facility];  Service: Cardiovascular;  Laterality: N/A;   MITRAL VALVE REPAIR N/A 02/13/2022   Procedure: MITRAL VALVE REPAIR;  Surgeon: [name], MD;  Location: [place] [facility];  Service: Cardiovascular;  Laterality: N/A;   ORIF PROXIMAL FEMORAL FRACTURE W/ ITST NAIL SYSTEM  03/2007   left, Dr. Shellia Carwin   RIGHT HEART CATH N/A 01/01/2022   Procedure: RIGHT HEART CATH;  Surgeon: Jolaine Artist, MD;  Location: [place] [facility];  Service: Cardiovascular;  Laterality: N/A;   SMALL INTESTINE SURGERY  2005   multiple with right colon resection for ischemia/infarct   TEE WITHOUT CARDIOVERSION N/A 01/14/2022   Procedure: TRANSESOPHAGEAL ECHOCARDIOGRAM (TEE);  Surgeon: [name], MD;  Location: [facility];  Service: Cardiology;  Laterality: N/A;   TEE WITHOUT CARDIOVERSION N/A 02/13/2022   Procedure: TRANSESOPHAGEAL ECHOCARDIOGRAM (TEE);  Surgeon: [name], MD;  Location: [place] [facility];  Service: Cardiovascular;  Laterality: N/A;    Current Medications: Current Meds  Medication Sig   acetaminophen (TYLENOL) 500 MG tablet Take 1,000 mg by mouth every 6 (six) hours as needed for mild pain.   ADULT TPN Inject 1,800 mLs into the vein See admin instructions. Pt receives home TPN from [facility]:  1800 mL bag, five nights weekly (Monday, Tuesday, Wednesday, Thursday and Friday for 8 hours (includes 1 hour taper up and down).   Calcium Carb-Cholecalciferol (CALCIUM + D3 PO) Take 1 tablet by  mouth in the morning and at bedtime.   cetirizine (ZYRTEC) 10 MG tablet Take 10 mg by mouth daily as needed for allergies.   clobetasol ointment (TEMOVATE)  4.09 % Apply 1 application  topically 2 (two) times daily as needed (rash).   empagliflozin (JARDIANCE) 10 MG TABS tablet Take 1 tablet (10 mg total) by mouth daily before breakfast.   ergocalciferol (DRISDOL) 200 MCG/ML drops Take 6 mLs (48,000 Units total) by mouth 3 (three) times a week.   furosemide (LASIX) 40 MG tablet Take 1 tablet (40 mg total) by mouth 2 (two) times daily. Morning & early afternoon.   Heparin Sodium, Porcine, (HEPARIN LOCK FLUSH IJ) Inject 5 mLs as directed See admin instructions. 5 ml with TPN infusion on Monday,Tuesday,Wednesday, Thursday, Friday.   losartan (COZAAR) 25 MG tablet Take 0.5 tablets (12.5 mg total) by mouth at bedtime. Please resume only if your kidney function is stable. Your PCP or cardiologist should advice you.   omeprazole (PRILOSEC) 40 MG capsule TAKE 1 CAPSULE BY MOUTH EVERY DAY BEFORE BREAKFAST   ondansetron (ZOFRAN-ODT) 8 MG disintegrating tablet TAKE 1 TABLET BY MOUTH EVERY 8 HOURS AS NEEDED FOR NAUSEA OR VOMITING.   Pediatric Multivit-Minerals (FLINTSTONES GUMMIES PO) Take 4 tablets by mouth in the morning.   potassium chloride SA (KLOR-CON M20) 20 MEQ tablet Take 1 tablet (20 mEq total) by mouth daily.   sodium chloride 0.9 % infusion Inject 900 mLs into the vein See admin instructions. 900 ml IV as needed for dehydration on TPN infusion days (Monday, Tuesday, Wednesday, Thursday, Friday)   spironolactone (ALDACTONE) 25 MG tablet TAKE 1 TABLET (25 MG TOTAL) BY MOUTH DAILY.   Teduglutide, rDNA, 5 MG KIT Inject 3.31 Units into the skin every morning.   vitamin E 1000 UNIT capsule Take 1,000 Units by mouth in the morning.   XARELTO 20 MG TABS tablet TAKE 1 TABLET BY MOUTH DAILY WITH SUPPER.     Allergies:   Cefepime and Ivp dye [iodinated contrast media]   Social History   Socioeconomic  History   Marital status: Married    Spouse name: Not on file   Number of children: 1   Years of education: Not on file   Highest education level: Not on file  Occupational History   Not on file  Tobacco Use   Smoking status: Never   Smokeless tobacco: Never  Vaping Use   Vaping Use: Never used  Substance and Sexual Activity   Alcohol use: Never   Drug use: Never   Sexual activity: Yes    Comment: 1st intercourse 5 yo-5 partners  Other Topics Concern   Not on file  Social History Narrative   ** Merged History Encounter **       Married to Herbie [place], has 1 daughter and a granddaughter the patient's son died in childhood due to a motor vehicle wreck 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 No alcohol tobacco or drug use 04/07/2017    Social Determinants of Health   Financial Resource Strain: Medium Risk (12/30/2017)   Overall Financial Resource Strain (CARDIA)    Difficulty of Paying Living Expenses: Somewhat hard  Food Insecurity: No Food Insecurity (01/22/2022)   Hunger Vital Sign    Worried About Running Out of Food in the Last Year: Never true    [place] in the Last Year: Never true  Transportation Needs: No Transportation Needs (01/22/2022)   PRAPARE - Hydrologist (Medical): No    Lack of Transportation (Non-Medical): No  Physical Activity: Insufficiently Active (12/30/2017)   Exercise Vital Sign    Days of Exercise  per Week: 7 days    Minutes of Exercise per Session: 20 min  Stress: No Stress Concern Present (12/30/2017)   [place]    Feeling of Stress : Not at all  Social Connections: Moderately Integrated (12/30/2017)   Social Connection and Isolation Panel [NHANES]    Frequency of Communication with Friends and Family: More than three times a week    Frequency of Social Gatherings with Friends and Family: Three times a week     Attends Religious Services: 1 to 4 times per year    Active Member of Clubs or Organizations: No    Attends Archivist Meetings: Never    Marital Status: Married    Family History: The patient's family history includes AAA (abdominal aortic aneurysm) in her mother; Diabetes in her mother; Hypertension in her mother. There is no history of Colon cancer or Stomach cancer.  ROS:   Please see the history of present illness.    All other systems reviewed and are negative.  EKGs/Labs/Other Studies Reviewed:    The following studies were reviewed today:  Aborted TEER 02/13/22:  Aborted transcatheter edge-to-edge repair of the mitral valve due to development of thrombus on the right atrial side of the interatrial septum.   Plan: Resume Xarelto as soon as the patient is able to swallow p.o.  Anticipate discharge later today.  Continue uninterrupted rivaroxaban and likely treat mitral regurgitation medically.  If we do reattempt MitraClip, the procedure will have to be done with the patient fully anticoagulated (no interruption of rivaroxaban).  Plan and procedural course was discussed with the patient's daughter, husband, and the patient following the procedure.   TEE 01/14/22:  1. Left ventricular ejection fraction, by estimation, is 30 to 35%. The  left ventricle has moderately decreased function. The left ventricular  internal cavity size was moderately dilated.   2. Right ventricular systolic function is mildly reduced. The right  ventricular size is normal.   3. Left atrial size was severely dilated. No left atrial/left atrial  appendage thrombus was detected. The LAA emptying velocity was 52 cm/s.   4. Right atrial size was mildly dilated.   5. Mechanism of mitral valve regurgitation is likely posterior leaflet  restriction due to regional wall motion abnormality and atrial functional  MR. No pulmonary vein systolic reversals in left sided pulmonary veins,  however there  are probable systolic  pulmonary vein reversals in the right sided pulmonary veins. The mitral  valve is grossly normal. Moderate to severe mitral valve regurgitation. No  evidence of mitral stenosis. Additionally, in some views the posterior  mitral annulus appears hypermobile.   6. The aortic valve is tricuspid. There is mild calcification of the  aortic valve. Aortic valve regurgitation is moderate. Mild aortic valve  stenosis. Aortic valve area, by VTI measures 1.41 cm. Aortic valve mean  gradient measures 10.0 mmHg. Aortic  valve Vmax measures 2.28 m/s.   7. There is Moderate (Grade III) atheroma plaque involving the aortic  arch and descending aorta.   8. Agitated saline contrast bubble study was positive with shunting  observed after >6 cardiac cycles suggestive of intrapulmonary shunting.   9. Indwelling catheter tip visualized in right atrium. No evidence of  vegetation.    EKG:  EKG is not ordered today.    Recent Labs: 01/22/2022: TSH 3.620 02/11/2022: ALT 27; B Natriuretic Peptide 2,029.1; BUN 30; Creatinine, Ser 1.58; Hemoglobin 10.8; Magnesium 2.3; Platelets 128; Potassium  4.5; Sodium 134  Recent Lipid Panel    Component Value Date/Time   CHOL 72 07/24/2020 0501   TRIG 142 02/05/2022 0450   HDL 17 (L) 07/24/2020 0501   CHOLHDL 4.2 07/24/2020 0501   VLDL 35 07/24/2020 0501   LDLCALC 20 07/24/2020 0501   Physical Exam:    VS:  BP 108/62   Ht _0  (1.727 m)   Wt 137 lb 6.4 oz (62.3 kg)   LMP 02/26/2014   BMI 20.89 kg/m     Wt Readings from Last 3 Encounters:  02/24/22 137 lb 6.4 oz (62.3 kg)  02/13/22 140 lb (63.5 kg)  02/11/22 137 lb 14.4 oz (62.6 kg)    General: Well developed, well nourished, NAD Neck: Negative for carotid bruits. No JVD Lungs:Clear to ausculation bilaterally. Breathing is unlabored. Cardiovascular: RRR with S1 S2. + holosystolic murmurs Extremities: No edema.  Neuro: Alert and oriented. No focal deficits. No facial asymmetry. MAE  spontaneously. Psych: Responds to questions appropriately with normal affect.    ASSESSMENT/PLAN:    Severe mitral regurgitation: Unfortunately, case was aborted due to development of thrombus on the right atrial side of the interatrial septum. She was resumed on Xarelto in cath lab holding soon after case end. Continue uninterrupted rivaroxaban and likely treat mitral regurgitation medically. Overall she feels like she is doing well given her circumstances. She is unsure if she would like to proceed with further workup or possible re-try at MitraClip given her stability. Dr. Burt Knack in the office today and came to see her. Plan will be to keep close monitoring on her for worsening symptoms and re-address at that time. She has close follow up with Dr. Haroldine Laws next month.   Left radial hematoma/bruising: Patient developed post cath hematoma and bruising. TR band placed and air removal guidelines followed. Continues to have mild bruising up the right forearm however site is soft and painless.    Systolic CHF/ischemic cardiomyopathy: TEE 01/14/22 with LVEF at 30-35%. Follows closely with AHF team.    Non-obstructive CAD: Last [facility] 06/26/20 with no occlusive disease with dRCA 30%, mLCx 30%, and pLAD/mLAD at 20%. Continue current regimen with    Short gut syndrome with chronic TPN: Continue TPN per GI.    Aortic stenosis/aortic regurgitation: Most recent TEE with moderate AR with mild aortic stenosis with an AVA by VTI at 1.41cm2, mean gradient at 90mHg. Continue with surveillance imaging.    Prior CVA: Follows closely with Dr. SLeonie Man Continue current regimen   Hypercoagulable disorder on chronic anticoagulation with Xarelto: Follows with Dr. GLindi Adie If we pursue MitraClip, would need to further discuss with hematology.   Medication Adjustments/Labs and Tests Ordered: Current medicines are reviewed at length with the patient today.  Concerns regarding medicines are outlined above.  No orders of the  defined types were placed in this encounter.  No orders of the defined types were placed in this encounter.   Patient Instructions  Medication Instructions:   Your physician recommends that you continue on your current medications as directed. Please refer to the Current Medication list given to you today.  *If you need a refill on your cardiac medications before your next appointment, please call your pharmacy*    Follow-Up:  As scheduled with Dr. BHaroldine Lawson 03/18/22 at 10 am--please arrive 15 mins prior to this appointment for check-in   Important Information About Sugar         Signed, JKathyrn Drown NP  02/24/2022 1:03 PM    [facility]

## 2022-02-25 ENCOUNTER — Telehealth: Payer: Self-pay | Admitting: *Deleted

## 2022-02-25 NOTE — Patient Outreach (Signed)
  Care Coordination   02/25/2022 Name: [name] [name] MRN: [id number] DOB: 1951/03/02   Care Coordination Outreach Attempts:  An unsuccessful telephone outreach was attempted today to offer the patient information about available care coordination services as a benefit of their health plan.   Follow Up Plan:  Additional outreach attempts will be made to offer the patient care coordination information and services.   Encounter Outcome:  No Answer  Care Coordination Interventions Activated:  No   Care Coordination Interventions:  No, not indicated    Raina Mina, RN Care Management Coordinator [place] Office [phone]

## 2022-02-26 ENCOUNTER — Encounter: Payer: Self-pay | Admitting: Family Medicine

## 2022-02-26 ENCOUNTER — Ambulatory Visit (INDEPENDENT_AMBULATORY_CARE_PROVIDER_SITE_OTHER): Payer: Medicare Other | Admitting: Family Medicine

## 2022-02-26 VITALS — BP 114/58 | HR 54 | Temp 97.3°F | Ht 68.0 in | Wt 141.2 lb

## 2022-02-26 DIAGNOSIS — I340 Nonrheumatic mitral (valve) insufficiency: Secondary | ICD-10-CM | POA: Diagnosis not present

## 2022-02-26 DIAGNOSIS — Z23 Encounter for immunization: Secondary | ICD-10-CM

## 2022-02-26 DIAGNOSIS — I6523 Occlusion and stenosis of bilateral carotid arteries: Secondary | ICD-10-CM

## 2022-02-26 DIAGNOSIS — T485X4A Poisoning by other anti-common-cold drugs, undetermined, initial encounter: Secondary | ICD-10-CM

## 2022-02-26 DIAGNOSIS — L989 Disorder of the skin and subcutaneous tissue, unspecified: Secondary | ICD-10-CM | POA: Diagnosis not present

## 2022-02-26 DIAGNOSIS — G9341 Metabolic encephalopathy: Secondary | ICD-10-CM

## 2022-02-26 NOTE — Assessment & Plan Note (Signed)
Resolved.  Pt reports she still has some days when she feels a bit 'foggy' but nothing like before.  The experience traumatized her as she had to be restrained while in the hospital.  She feels that her Benadryl OD was due to her encephalopathy rather than how she truly felt in the moment.

## 2022-02-26 NOTE — Progress Notes (Signed)
   Subjective:    Patient ID: [name] [name], female    DOB: [birth year], 71 y.o.   MRN: [id number]  HPI Hospital f/u- pt was admitted 10/5 w/ severe mitral regurg.  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  She developed a radial hematoma from the [place].  Prior to procedure was seen in ER for an apparent suicide attempt on 9/26 after an argument w/ husband.  She took a handful of Benadryl.She was immediately regretful.  Was evaluated by psych and deemed not to be a danger to herself.  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  'they tied me to the bed'.    Today pt reports feeling 'way better'.  Saw Dr Burt Knack on 10/16 and felt reassured at that visit.  L arm hematoma is improving.  No CP, SOB (was able to walk the [place] yesterday).  No swelling.  Denies pain.     Review of Systems For ROS see HPI     Objective:   Physical Exam Vitals reviewed.  Constitutional:      General: She is not in acute distress.    Appearance: Normal appearance. She is well-developed. She is not ill-appearing.  HENT:     Head: Normocephalic and atraumatic.  Eyes:     Conjunctiva/sclera: Conjunctivae normal.     Pupils: Pupils are equal, round, and reactive to light.  Neck:     Thyroid: No thyromegaly.  Cardiovascular:     Rate and Rhythm: Normal rate and regular rhythm.     Pulses: Normal pulses.     Heart sounds: Murmur heard.  Pulmonary:     Effort: Pulmonary effort is normal. No respiratory distress.     Breath sounds: Normal breath sounds.  Abdominal:     General: There is no distension.     Palpations: Abdomen is soft.     Tenderness: There is no abdominal tenderness.  Musculoskeletal:     Cervical back: Normal range of motion and neck supple.     Right lower leg:  No edema.     Left lower leg: No edema.  Lymphadenopathy:     Cervical: No cervical adenopathy.  Skin:    General: Skin is warm and dry.  Neurological:     General: No focal deficit present.     Mental Status: She is alert and oriented to person, place, and time.  Psychiatric:        Mood and Affect: Mood normal.        Behavior: Behavior normal.          Assessment & Plan:

## 2022-02-26 NOTE — Patient Instructions (Signed)
Follow up as needed or as scheduled No need for repeat blood work today- yay!!! Make sure you continue to take care of yourself and allow yourself time to heal Call with any questions or concerns Stay Safe!  Stay Healthy! Hang in there!!!

## 2022-02-26 NOTE — Assessment & Plan Note (Signed)
Ongoing issue.  Pt is still hopeful to have TEER in the near future.  Dr Burt Knack indicated that she will need to remain on her anticoagulation to do this as the procedure was aborted due to a clot that formed.  Currently asymptomatic.  Will follow along.

## 2022-02-26 NOTE — Assessment & Plan Note (Signed)
New.  Pt reports she is not suicidal and is very embarrassed that she took the medication.  She did not want to harm herself and was instantly regretful.  She feels it was the encephalopathy that contributed to this rash decision.

## 2022-02-27 ENCOUNTER — Telehealth: Payer: Self-pay | Admitting: Internal Medicine

## 2022-02-27 DIAGNOSIS — I5042 Chronic combined systolic (congestive) and diastolic (congestive) heart failure: Secondary | ICD-10-CM | POA: Diagnosis not present

## 2022-02-27 DIAGNOSIS — K912 Postsurgical malabsorption, not elsewhere classified: Secondary | ICD-10-CM | POA: Diagnosis not present

## 2022-02-27 DIAGNOSIS — T361X5D Adverse effect of cephalosporins and other beta-lactam antibiotics, subsequent encounter: Secondary | ICD-10-CM | POA: Diagnosis not present

## 2022-02-27 DIAGNOSIS — K639 Disease of intestine, unspecified: Secondary | ICD-10-CM | POA: Diagnosis not present

## 2022-02-27 DIAGNOSIS — G928 Other toxic encephalopathy: Secondary | ICD-10-CM | POA: Diagnosis not present

## 2022-02-27 DIAGNOSIS — G9341 Metabolic encephalopathy: Secondary | ICD-10-CM | POA: Diagnosis not present

## 2022-02-27 NOTE — Telephone Encounter (Signed)
TPN team requesting following labs with next lab draw   CMET Magnesium Phosphorus CBC/diff Triglycerides Pre-albumin Ferritin Iron/TIBC Zinc Copper Selenium Manganese CRP Vitamin A Vitamin E Vitamin D 25 hydroxy Carnitine - free and total  Some of these may already be ordered but can we get all of these done next week with upcoming labs?

## 2022-02-28 ENCOUNTER — Other Ambulatory Visit: Payer: Self-pay

## 2022-02-28 DIAGNOSIS — K90829 Short bowel syndrome, unspecified: Secondary | ICD-10-CM

## 2022-02-28 DIAGNOSIS — D638 Anemia in other chronic diseases classified elsewhere: Secondary | ICD-10-CM

## 2022-02-28 DIAGNOSIS — Z789 Other specified health status: Secondary | ICD-10-CM

## 2022-02-28 DIAGNOSIS — K760 Fatty (change of) liver, not elsewhere classified: Secondary | ICD-10-CM

## 2022-02-28 NOTE — Telephone Encounter (Signed)
Pt made aware of Dr. Carlean Purl recommendations for labs: Orders for  labs placed in Epic: Pt made aware Pt stated that she will come next week and have labs drawn:  Pt verbalized understanding with all questions answered.

## 2022-03-02 ENCOUNTER — Encounter: Payer: Self-pay | Admitting: Internal Medicine

## 2022-03-03 ENCOUNTER — Other Ambulatory Visit (HOSPITAL_COMMUNITY): Payer: Self-pay

## 2022-03-03 ENCOUNTER — Other Ambulatory Visit: Payer: Self-pay

## 2022-03-03 ENCOUNTER — Other Ambulatory Visit (INDEPENDENT_AMBULATORY_CARE_PROVIDER_SITE_OTHER): Payer: Medicare Other

## 2022-03-03 DIAGNOSIS — K90829 Short bowel syndrome, unspecified: Secondary | ICD-10-CM

## 2022-03-03 DIAGNOSIS — Z789 Other specified health status: Secondary | ICD-10-CM | POA: Diagnosis not present

## 2022-03-03 DIAGNOSIS — D638 Anemia in other chronic diseases classified elsewhere: Secondary | ICD-10-CM

## 2022-03-03 DIAGNOSIS — K760 Fatty (change of) liver, not elsewhere classified: Secondary | ICD-10-CM

## 2022-03-03 DIAGNOSIS — R300 Dysuria: Secondary | ICD-10-CM

## 2022-03-03 LAB — COMPREHENSIVE METABOLIC PANEL
ALT: 24 U/L (ref 0–35)
AST: 23 U/L (ref 0–37)
Albumin: 3.3 g/dL — ABNORMAL LOW (ref 3.5–5.2)
Alkaline Phosphatase: 168 U/L — ABNORMAL HIGH (ref 39–117)
BUN: 28 mg/dL — ABNORMAL HIGH (ref 6–23)
CO2: 23 mEq/L (ref 19–32)
Calcium: 8.7 mg/dL (ref 8.4–10.5)
Chloride: 100 mEq/L (ref 96–112)
Creatinine, Ser: 1.87 mg/dL — ABNORMAL HIGH (ref 0.40–1.20)
GFR: 26.68 mL/min — ABNORMAL LOW (ref >60.00)
Glucose, Bld: 88 mg/dL (ref 70–99)
Potassium: 4.1 mEq/L (ref 3.5–5.1)
Sodium: 132 mEq/L — ABNORMAL LOW (ref 135–145)
Total Bilirubin: 0.6 mg/dL (ref 0.2–1.2)
Total Protein: 8.5 g/dL — ABNORMAL HIGH (ref 6.0–8.3)

## 2022-03-03 LAB — CBC WITH DIFFERENTIAL/PLATELET
Basophils Absolute: 0.1 10*3/uL (ref 0.0–0.1)
Basophils Relative: 0.7 % (ref 0.0–3.0)
Eosinophils Absolute: 0.3 10*3/uL (ref 0.0–0.7)
Eosinophils Relative: 4.3 % (ref 0.0–5.0)
HCT: 28.9 % — ABNORMAL LOW (ref 36.0–46.0)
Hemoglobin: 9.5 g/dL — ABNORMAL LOW (ref 12.0–15.0)
Lymphocytes Relative: 17 % (ref 12.0–46.0)
Lymphs Abs: 1.3 10*3/uL (ref 0.7–4.0)
MCHC: 32.7 g/dL (ref 30.0–36.0)
MCV: 93.5 fl (ref 78.0–100.0)
Monocytes Absolute: 0.6 10*3/uL (ref 0.1–1.0)
Monocytes Relative: 8 % (ref 3.0–12.0)
Neutro Abs: 5.4 10*3/uL (ref 1.4–7.7)
Neutrophils Relative %: 70 % (ref 43.0–77.0)
Platelets: 106 10*3/uL — ABNORMAL LOW (ref 150.0–400.0)
RBC: 3.1 Mil/uL — ABNORMAL LOW (ref 3.87–5.11)
RDW: 17.1 % — ABNORMAL HIGH (ref 11.5–15.5)
WBC: 7.8 10*3/uL (ref 4.0–10.5)

## 2022-03-03 LAB — C-REACTIVE PROTEIN: CRP: 6.8 mg/dL (ref 0.5–20.0)

## 2022-03-03 LAB — MAGNESIUM: Magnesium: 1.8 mg/dL (ref 1.5–2.5)

## 2022-03-03 LAB — PHOSPHORUS: Phosphorus: 4.9 mg/dL — ABNORMAL HIGH (ref 2.3–4.6)

## 2022-03-03 LAB — TRIGLYCERIDES: Triglycerides: 206 mg/dL — ABNORMAL HIGH (ref 0.0–149.0)

## 2022-03-03 LAB — VITAMIN D 25 HYDROXY (VIT D DEFICIENCY, FRACTURES): VITD: 8.11 ng/mL — ABNORMAL LOW (ref 30.00–100.00)

## 2022-03-03 MED ORDER — EMPAGLIFLOZIN 10 MG PO TABS: 10.0000 mg | ORAL_TABLET | Freq: Every day | ORAL | 3 refills | Status: AC

## 2022-03-04 ENCOUNTER — Telehealth: Payer: Self-pay | Admitting: Internal Medicine

## 2022-03-04 ENCOUNTER — Encounter: Payer: Self-pay | Admitting: *Deleted

## 2022-03-04 ENCOUNTER — Telehealth: Payer: Self-pay | Admitting: *Deleted

## 2022-03-04 ENCOUNTER — Telehealth: Payer: Self-pay

## 2022-03-04 LAB — IRON,TIBC AND FERRITIN PANEL
%SAT: 14 % (calc) — ABNORMAL LOW (ref 16–45)
Ferritin: 224 ng/mL (ref 16–288)
Iron: 41 ug/dL — ABNORMAL LOW (ref 45–160)
TIBC: 296 mcg/dL (calc) (ref 250–450)

## 2022-03-04 LAB — EXTRA SPECIMEN

## 2022-03-04 LAB — ZINC: Zinc: 56 ug/dL — ABNORMAL LOW (ref 60–130)

## 2022-03-04 LAB — PREALBUMIN: Prealbumin: 22 mg/dL (ref 17–34)

## 2022-03-04 LAB — URINALYSIS W MICROSCOPIC + REFLEX CULTURE

## 2022-03-04 LAB — SELENIUM SERUM: Selenium, Blood: 106 mcg/L (ref 63–160)

## 2022-03-04 NOTE — Patient Instructions (Signed)
Visit Information  Thank you for taking time to visit with me today. Please don't hesitate to contact me if I can be of assistance to you.   Following are the goals we discussed today:   Goals Addressed               This Visit's Progress     COMPLETED: RSV (pneumonia vaccine) (pt-stated)        Care Coordination Interventions: Advised patient to contact her provider's office to schedule her AWV with her primary provider Reviewed medications with patient and discussed adherence with no needed refills Reviewed scheduled/upcoming provider appointments including pending appointments Advised patient to discuss availability of the RSV (pneumonia vaccine) at local drug stores or with provider Assessed social determinant of health barriers           Please call the care guide team at [phone] if you need to cancel or reschedule your appointment.   If you are experiencing a Mental Health or [place] or need someone to talk to, please call the Suicide and Crisis Lifeline: 988 call the [place] National Suicide Prevention Lifeline: (737) 487-6853 or TTY: (424)226-4165 TTY (323) 100-2312) to talk to a trained counselor call 1-800-273-TALK (toll free, 24 hour hotline)  Patient verbalizes understanding of instructions and care plan provided today and agrees to view in [place]. Active MyChart status and patient understanding of how to access instructions and care plan via MyChart confirmed with patient.     No further follow up required: No needs  Raina Mina, RN Care Management Coordinator [place] Office [phone]

## 2022-03-04 NOTE — Patient Outreach (Signed)
  Care Coordination   Initial Visit Note   03/04/2022 Name: [name] [name] MRN: [id number] DOB: [birth year]  [name] [name] is a 71 y.o. year old female who sees Tabori, Aundra Millet, MD for primary care. I spoke with  [name] [name] by phone today.  What matters to the patients health and wellness today?  RSV/pneumonia vaccine    Goals Addressed               This Visit's Progress     COMPLETED: RSV (pneumonia vaccine) (pt-stated)        Care Coordination Interventions: Advised patient to contact her provider's office to schedule her AWV with her primary provider Reviewed medications with patient and discussed adherence with no needed refills Reviewed scheduled/upcoming provider appointments including pending appointments Advised patient to discuss availability of the RSV (pneumonia vaccine) at local drug stores or with provider Assessed social determinant of health barriers          SDOH assessments and interventions completed:  Yes  SDOH Interventions Today    Flowsheet Row Most Recent Value  SDOH Interventions   Food Insecurity Interventions Intervention Not Indicated  Housing Interventions Intervention Not Indicated  Transportation Interventions Intervention Not Indicated  Utilities Interventions Intervention Not Indicated        Care Coordination Interventions Activated:  Yes  Care Coordination Interventions:  Yes, provided   Follow up plan: No further intervention required.   Encounter Outcome:  Pt. Visit Completed   Raina Mina, RN Care Management Coordinator [place] Office [phone]

## 2022-03-04 NOTE — Telephone Encounter (Signed)
[name] [name] from [place] called requesting a call back on [phone]. Please call to advise.

## 2022-03-04 NOTE — Telephone Encounter (Signed)
Pt has an apt w/ [facility] on 03/05/22 @ 1040 am.

## 2022-03-05 ENCOUNTER — Ambulatory Visit
Admission: EM | Admit: 2022-03-05 | Discharge: 2022-03-05 | Disposition: A | Discharge status: Home / Self Care | Payer: Medicare Other | Attending: Emergency Medicine | Admitting: Emergency Medicine

## 2022-03-05 DIAGNOSIS — R350 Frequency of micturition: Secondary | ICD-10-CM | POA: Insufficient documentation

## 2022-03-05 DIAGNOSIS — R81 Glycosuria: Secondary | ICD-10-CM | POA: Insufficient documentation

## 2022-03-05 DIAGNOSIS — R739 Hyperglycemia, unspecified: Secondary | ICD-10-CM | POA: Diagnosis not present

## 2022-03-05 DIAGNOSIS — L820 Inflamed seborrheic keratosis: Secondary | ICD-10-CM | POA: Diagnosis not present

## 2022-03-05 DIAGNOSIS — T50905A Adverse effect of unspecified drugs, medicaments and biological substances, initial encounter: Secondary | ICD-10-CM | POA: Insufficient documentation

## 2022-03-05 DIAGNOSIS — R7309 Other abnormal glucose: Secondary | ICD-10-CM | POA: Diagnosis not present

## 2022-03-05 DIAGNOSIS — R300 Dysuria: Secondary | ICD-10-CM | POA: Insufficient documentation

## 2022-03-05 LAB — POCT URINALYSIS DIP (MANUAL ENTRY)
Bilirubin, UA: NEGATIVE
Glucose, UA: >=1000 mg/dL — AB
Ketones, POC UA: NEGATIVE mg/dL
Leukocytes, UA: NEGATIVE
Nitrite, UA: NEGATIVE
Protein Ur, POC: NEGATIVE mg/dL
Spec Grav, UA: <=1.005 — AB (ref 1.010–1.025)
Urobilinogen, UA: 0.2 E.U./dL
pH, UA: 6 (ref 5.0–8.0)

## 2022-03-05 LAB — POCT FASTING CBG KUC MANUAL ENTRY: POCT Glucose (KUC): 378 mg/dL — AB (ref 70–99)

## 2022-03-05 MED ORDER — CEFTRIAXONE SODIUM 1 G IJ SOLR
1.0000 g | Freq: Once | INTRAMUSCULAR | Status: AC
  Administered 2022-03-05: 1 g via INTRAMUSCULAR

## 2022-03-05 NOTE — ED Provider Notes (Signed)
[facility]    CSN: [id number] Arrival date & time: 03/05/22  1846    HISTORY   Chief Complaint  Patient presents with   Urinary Frequency   Back Pain   Leg Pain   HPI [name] [name] is a pleasant, 71 y.o. female who presents to urgent care today. The pt c/o possible UTI she and c/o urinary frequency, lower back pain, bilateral leg pain and general discomfort for the past 2 days.  Patient states has been taking Azo without relief of her symptoms.  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 Patient denies a history of type 2 diabetes.  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  Patient has also in stage III renal failure and has severe mitral valve insufficiency status post minimally invasive mitral valve repair earlier this month.  Patient denies flank pain, fever, vaginal discharge, urine malodor, sensation of incomplete emptying, burning with urination.  Urine dip performed during her visit today was not concerning for urinary tract infection she had a significant amount of glucose and red blood cells present in her urine.  Fingerstick blood glucose performed in the clinic today was 378.  Patient states that these are is due to her currently infusing TPN.  It is also interesting to note that despite patient reporting taking Azo over the past 2 days her urinalysis did not show the anticipated abnormal findings related to Azo.  The history is provided by the patient.   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    Carnitine deficiency ([place]) 05/25/2018   Carotid stenosis    Carotid US (9/15):  R 40-59%; L 1-39% >> FU 1 year   Cataract    Closed left subtrochanteric femur fracture ([place]) 12/31/2017   Clotting disorder ([place])    Congestive heart failure (CHF) (HCC)    EF 25-30%   COVID-19 2022   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   Fever 10/29/2020   Fracture of left clavicle    GERD (gastroesophageal reflux disease)    History of blood transfusion 2013   anemia   Hx of cardiovascular stress test    Myoview (9/15):  inf-apical scar; no ischemia; EF 47% - low risk    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 12/12/2011   Osteoporosis    Pancytopenia 10/07/2011   Pathologic fracture of neck of femur ([place])    Peripheral vascular disease ([place])    Personal history of colonic polyps    Renal insufficiency    hx of yrs ago   Serratia marcescens infection - bactermia assoc w/ PICC 01/18/2015   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HCC)    Splenomegaly    By ultrasound   Stroke (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/25/2020   Stroke ([place]) 07/2020   Thrombophilia ([place])    Vitamin D deficiency    Patient Active Problem List   Diagnosis Date Noted   Severe mitral regurgitation  02/13/2022   Unspecified mood (affective) disorder ([place]) 02/05/2022   Overdose of common cold drug 02/05/2022   Drug overdose 02/05/2022   Acute respiratory failure with hypoxia ([place]) 02/04/2022   Acute on chronic systolic CHF (congestive heart failure) ([place]) 02/04/2022   Flash pulmonary edema ([place]) 02/04/2022   Abnormal finding on GI tract imaging    Acute metabolic encephalopathy 38/25/0539   Chills 01/15/2022   Medication management 01/15/2022   Nonrheumatic mitral valve regurgitation    STEC (Shiga toxin-producing Escherichia coli) 07/01/2021   Streptococcal sepsis, unspecified ([place]) 06/30/2021   Left arm cellulitis 06/29/2021   Cellulitis of left breast 06/29/2021   Humerus fracture  - right 06/29/2021   New onset a-fib ([place]) 06/29/2021   Carotid artery stenosis 10/31/2020   Bacteremia 10/27/2020   Macrocytic anemia 10/27/2020   NAFLD (nonalcoholic fatty liver disease) 09/21/2020   History of CVA (cerebrovascular accident) 07/24/2020   Thrombocytopenia ([place]) 07/24/2020   Stage 3a chronic kidney disease (CKD) ([place]) 07/24/2020   Pressure injury of skin 07/24/2020   Carnitine deficiency ([place]) 05/25/2018   Hypotension 12/30/2017   Bradycardia 12/30/2017   Deep venous thrombosis ([place]) left subclavian vein 07/31/2017   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[place]) 07/31/2017   Degenerative joint disease 07/08/2016   PICC line infection 05/16/2015   H/O mesenteric infarction 11/17/2013   Splenomegaly    Hx of adenomatous colonic polyps 76/73/4193   Chronic systolic heart failure (HCC) - LVEF 35-40% 12/25/2011   Hypokalemia 12/14/2011   Acute renal failure superimposed on stage 3a chronic kidney disease ([employer]) 12/08/2011   Hyponatremia 12/08/2011   Osteoporosis 03/14/2010   Anemia of chronic disease 10/01/2009   GERD (gastroesophageal reflux disease) 11/19/2008   ALKALINE PHOSPHATASE, ELEVATED 10/12/2008   VITAMIN D DEFICIENCY 01/19/2008   TRANSAMINASES, SERUM, ELEVATED 01/19/2008   Colonic mass 11/19/2004   Acquired short bowel syndrome 11/20/2003   Past Surgical History:  Procedure Laterality Date   APPENDECTOMY  yrs ago   BUBBLE STUDY  01/14/2022   Procedure: BUBBLE STUDY;  Surgeon: [name], MD;  Location: [facility] ENDOSCOPY;  Service: Cardiology;;   CATARACT EXTRACTION Bilateral 2023   CHOLECYSTECTOMY  yrs ago   COLONOSCOPY  12/05/2005   internal hemorrhoids (for polyp surveillance)   COLONOSCOPY  04/26/2012   Procedure: COLONOSCOPY;  Surgeon: Gatha Mayer, MD;  Location: [facility] ENDOSCOPY;  Service: Endoscopy;  Laterality: N/A;   COLONOSCOPY WITH PROPOFOL N/A 03/18/2016   Procedure: COLONOSCOPY WITH PROPOFOL;  Surgeon: Gatha Mayer, MD;  Location: [facility] ENDOSCOPY;   Service: Endoscopy;  Laterality: N/A;   ESOPHAGOGASTRODUODENOSCOPY  01/22/2009   erosive esophagitis   HARDWARE REMOVAL Left 12/31/2017   Procedure: HARDWARE REMOVAL;  Surgeon: Rod Can, MD;  Location: [facility] ORS;  Service: Orthopedics;  Laterality: Left;   INTRAMEDULLARY (IM) NAIL INTERTROCHANTERIC Left 12/31/2017   Procedure: INTRAMEDULLARY (IM) NAIL SUBTROCHANTRIC;  Surgeon: Rod Can, MD;  Location: [facility] ORS;  Service: Orthopedics;  Laterality: Left;   IR CV LINE INJECTION  03/23/2018   IR CV LINE INJECTION  10/02/2021   IR FLUORO GUIDE CV LINE LEFT  01/01/2018   IR FLUORO GUIDE CV LINE LEFT  03/24/2018   IR FLUORO GUIDE CV LINE LEFT  05/21/2018   IR FLUORO GUIDE CV LINE LEFT  07/09/2018   IR FLUORO GUIDE CV LINE LEFT  12/28/2020   IR FLUORO GUIDE CV LINE LEFT  07/02/2021   IR FLUORO GUIDE CV LINE LEFT  01/14/2022   IR FLUORO GUIDE CV LINE RIGHT  11/01/2020   IR  GENERIC HISTORICAL  07/04/2016   IR REMOVAL TUN CV CATH W/O FL 07/04/2016 Ascencion Dike, PA-C [facility]   IR GENERIC HISTORICAL  07/10/2016   IR US GUIDE VASC ACCESS LEFT 07/10/2016 Arne Cleveland, MD [facility]   IR GENERIC HISTORICAL  07/10/2016   IR FLUORO GUIDE CV LINE LEFT 07/10/2016 Arne Cleveland, MD [facility]   IR PATIENT EVAL TECH 0-60 MINS  10/07/2017   IR PATIENT EVAL TECH 0-60 MINS  03/09/2018   IR RADIOLOGIST EVAL & MGMT  07/21/2017   IR RADIOLOGIST EVAL & MGMT  11/14/2020   IR RADIOLOGIST EVAL & MGMT  11/27/2020   IR RADIOLOGIST EVAL & MGMT  01/24/2021   IR REMOVAL TUN CV CATH W/O FL  12/28/2020   IR TRANSCATH PLC STENT  INITIAL VEIN  INC ANGIOPLASTY  12/28/2020   IR US GUIDE VASC ACCESS LEFT  11/01/2020   IR US GUIDE VASC ACCESS LEFT  12/28/2020   IR US GUIDE VASC ACCESS LEFT  12/28/2020   IR US GUIDE VASC ACCESS LEFT  12/28/2020   IR US GUIDE VASC ACCESS RIGHT  11/01/2020   IR VENO/EXT/UNI LEFT  11/01/2020   IR VENOCAVAGRAM SVC  12/28/2020   LEFT HEART CATH AND CORONARY ANGIOGRAPHY N/A  06/26/2020   Procedure: LEFT HEART CATH AND CORONARY ANGIOGRAPHY;  Surgeon: Jolaine Artist, MD;  Location: [facility];  Service: Cardiovascular;  Laterality: N/A;   MITRAL VALVE REPAIR N/A 02/13/2022   Procedure: MITRAL VALVE REPAIR;  Surgeon: [name], MD;  Location: [place] [facility];  Service: Cardiovascular;  Laterality: N/A;   ORIF PROXIMAL FEMORAL FRACTURE W/ ITST NAIL SYSTEM  03/2007   left, Dr. Shellia Carwin   RIGHT HEART CATH N/A 01/01/2022   Procedure: RIGHT HEART CATH;  Surgeon: Jolaine Artist, MD;  Location: [facility];  Service: Cardiovascular;  Laterality: N/A;   SMALL INTESTINE SURGERY  2005   multiple with right colon resection for ischemia/infarct   TEE WITHOUT CARDIOVERSION N/A 01/14/2022   Procedure: TRANSESOPHAGEAL ECHOCARDIOGRAM (TEE);  Surgeon: [name], MD;  Location: [facility];  Service: Cardiology;  Laterality: N/A;   TEE WITHOUT CARDIOVERSION N/A 02/13/2022   Procedure: TRANSESOPHAGEAL ECHOCARDIOGRAM (TEE);  Surgeon: [name], MD;  Location: [place] [facility];  Service: Cardiovascular;  Laterality: N/A;   OB History     Gravida  2   Para  2   Term  0   Preterm  0   AB  0   Living         SAB  0   IAB  0   Ectopic  0   Multiple      Live Births             Home Medications    Prior to Admission medications   Medication Sig Start Date End Date Taking? Authorizing Provider  acetaminophen (TYLENOL) 500 MG tablet Take 1,000 mg by mouth every 6 (six) hours as needed for mild pain.    [provider]  ADULT TPN Inject 1,800 mLs into the vein See admin instructions. Pt receives home TPN from [facility]:  1800 mL bag, five nights weekly (Monday, Tuesday, Wednesday, Thursday and Friday for 8 hours (includes 1 hour taper up and down).    [provider]  Calcium Carb-Cholecalciferol (CALCIUM + D3 PO) Take 1 tablet by mouth in the morning and at bedtime.    [provider]   cetirizine (ZYRTEC) 10 MG tablet Take  10 mg by mouth daily as needed for allergies.    [provider]  clobetasol ointment (TEMOVATE) 0.05 % Apply 1 application  topically 2 (two) times daily as needed (rash). 06/14/21   [provider]  empagliflozin (JARDIANCE) 10 MG TABS tablet Take 1 tablet (10 mg total) by mouth daily before breakfast. 03/03/22   Bensimhon, Bevelyn Buckles, MD  ergocalciferol (DRISDOL) 200 MCG/ML drops Take 6 mLs (48,000 Units total) by mouth 3 (three) times a week. 08/21/21   Iva Boop, MD  furosemide (LASIX) 40 MG tablet Take 1 tablet (40 mg total) by mouth 2 (two) times daily. Morning & early afternoon. 02/06/22   Osvaldo Shipper, MD  Heparin Sodium, Porcine, (HEPARIN LOCK FLUSH IJ) Inject 5 mLs as directed See admin instructions. 5 ml with TPN infusion on Monday,Tuesday,Wednesday, Thursday, Friday.    [provider]  losartan (COZAAR) 25 MG tablet Take 0.5 tablets (12.5 mg total) by mouth at bedtime. Please resume only if your kidney function is stable. Your PCP or cardiologist should advice you. 02/12/22   Osvaldo Shipper, MD  omeprazole (PRILOSEC) 40 MG capsule TAKE 1 CAPSULE BY MOUTH EVERY DAY BEFORE BREAKFAST 12/27/21   Iva Boop, MD  ondansetron (ZOFRAN-ODT) 8 MG disintegrating tablet TAKE 1 TABLET BY MOUTH EVERY 8 HOURS AS NEEDED FOR NAUSEA OR VOMITING. 09/09/21   Iva Boop, MD  Pediatric Multivit-Minerals (FLINTSTONES GUMMIES PO) Take 4 tablets by mouth in the morning.    [provider]  potassium chloride SA (KLOR-CON M20) 20 MEQ tablet Take 1 tablet (20 mEq total) by mouth daily. 12/16/21   Bensimhon, Bevelyn Buckles, MD  sodium chloride 0.9 % infusion Inject 900 mLs into the vein See admin instructions. 900 ml IV as needed for dehydration on TPN infusion days (Monday, Tuesday, Wednesday, Thursday, Friday) 10/27/17   [provider]  spironolactone (ALDACTONE) 25 MG tablet TAKE 1 TABLET (25 MG TOTAL) BY MOUTH DAILY. 08/28/21    Bensimhon, Bevelyn Buckles, MD  Teduglutide, rDNA, 5 MG KIT Inject 3.31 Units into the skin every morning. 10/17/21   Iva Boop, MD  vitamin E 1000 UNIT capsule Take 1,000 Units by mouth in the morning.    [provider]  XARELTO 20 MG TABS tablet TAKE 1 TABLET BY MOUTH DAILY WITH SUPPER. 08/30/21   [name], MD    Family History Family History  Problem Relation Age of Onset   Diabetes Mother    Hypertension Mother    AAA (abdominal aortic aneurysm) Mother    Colon cancer Neg Hx    Stomach cancer Neg Hx    Social History Social History   Tobacco Use   Smoking status: Former    Types: Cigarettes    Quit date: 1974    Years since quitting: 49.8   Smokeless tobacco: Never  Vaping Use   Vaping Use: Never used  Substance Use Topics   Alcohol use: Never   Drug use: Never   Allergies   Cefepime and Ivp dye [iodinated contrast media]  Review of Systems Review of Systems Pertinent findings revealed after performing a 14 point review of systems has been noted in the history of present illness.  Physical Exam Triage Vital Signs ED Triage Vitals  Enc Vitals Group     BP 03/08/21 0827 (!) 147/82     Pulse Rate 03/08/21 0827 72     Resp 03/08/21 0827 18     Temp 03/08/21 0827 98.3 F (36.8 C)  Temp Source 03/08/21 0827 Oral     SpO2 03/08/21 0827 98 %     Weight --      Height --      Head Circumference --      Peak Flow --      Pain Score 03/08/21 0826 5     Pain Loc --      Pain Edu? --      Excl. in [place]? --   No data found.  Updated Vital Signs BP (!) 123/56 (BP Location: Right Arm)   Pulse 82   Temp 98.7 F (37.1 C) (Oral)   Resp 16   LMP 02/26/2014   SpO2 93%   Physical Exam Vitals and nursing note reviewed.  Constitutional:      General: She is not in acute distress.    Appearance: Normal appearance. She is obese. She is not toxic-appearing.  HENT:     Head: Normocephalic and atraumatic.  Eyes:     Pupils: Pupils are equal, round,  and reactive to light.  Cardiovascular:     Rate and Rhythm: Normal rate and regular rhythm.  Pulmonary:     Effort: Pulmonary effort is normal.     Breath sounds: Normal breath sounds.  Abdominal:     General: Abdomen is flat. Bowel sounds are normal.     Palpations: Abdomen is soft.  Musculoskeletal:        General: Normal range of motion.     Cervical back: Normal range of motion and neck supple.  Skin:    General: Skin is warm and dry.  Neurological:     General: No focal deficit present.     Mental Status: She is alert and oriented to person, place, and time. Mental status is at baseline.  Psychiatric:        Mood and Affect: Mood normal.        Behavior: Behavior normal.        Thought Content: Thought content normal.        Judgment: Judgment normal.     Visual Acuity Right Eye Distance:   Left Eye Distance:   Bilateral Distance:    Right Eye Near:   Left Eye Near:    Bilateral Near:     [facility] / Diagnostics / Procedures:     Radiology No results found.  Procedures Procedures (including critical care time) EKG  Pending results:  Labs Reviewed  POCT FASTING CBG [facility] MANUAL ENTRY - Abnormal; Notable for the following components:      Result Value   POCT Glucose ([facility]) 378 (*)    All other components within normal limits  POCT URINALYSIS DIP (MANUAL ENTRY) - Abnormal; Notable for the following components:   Glucose, UA >=1,000 (*)    Spec Grav, UA <=1.005 (*)    Blood, UA moderate (*)    All other components within normal limits  URINE CULTURE    Medications Ordered in UC: Medications  cefTRIAXone (ROCEPHIN) injection 1 g (1 g Intramuscular Given 03/05/22 1957)    UC Diagnoses / Final Clinical Impressions(s)   I have reviewed the triage vital signs and the nursing notes.  Pertinent labs & imaging results that were available during my care of the patient were reviewed by me and considered in my medical decision making (see chart for details).     Final diagnoses:  Dysuria  Increased frequency of urination  Drug-induced hyperglycemia  Elevated serum glucose with glucosuria   Patient advised of urine  dip findings and blood glucose level.  Patient advised to follow-up with her PCP to repeat hemoglobin A1c which was last checked a little over a year ago and was normal.  Incidentally, blood sugars around that time were also normal and have been gradually increasing since.  Given patient's history of bacteremia and recent hospitalization for this, I have advised patient that we are happy to provide her with an injection of ceftriaxone while we await the results of her urine culture.  Patient was agreeable to this plan.  If urine culture is positive, we will treat based on organism found.  Patient was agreeable to going to the emergency room for further, more acute evaluation if symptoms worsen in the next 24 hours despite ceftriaxone.  ED Prescriptions   None    PDMP not reviewed this encounter.  Disposition Upon Discharge:  Condition: stable for discharge home  Patient presented with concern for an acute illness with associated systemic symptoms and significant discomfort requiring urgent management. In my opinion, this is a condition that a prudent lay person (someone who possesses an average knowledge of health and medicine) may potentially expect to result in complications if not addressed urgently such as respiratory distress, impairment of bodily function or dysfunction of bodily organs.   As such, the patient has been evaluated and assessed, work-up was performed and treatment was provided in alignment with urgent care protocols and evidence based medicine.  Patient/parent/caregiver has been advised that the patient may require follow up for further testing and/or treatment if the symptoms continue in spite of treatment, as clinically indicated and appropriate.  Routine symptom specific, illness specific and/or disease specific  instructions were discussed with the patient and/or caregiver at length.  Prevention strategies for avoiding STD exposure were also discussed.  The patient will follow up with their current PCP if and as advised. If the patient does not currently have a PCP we will assist them in obtaining one.   The patient may need specialty follow up if the symptoms continue, in spite of conservative treatment and management, for further workup, evaluation, consultation and treatment as clinically indicated and appropriate.  Patient/parent/caregiver verbalized understanding and agreement of plan as discussed.  All questions were addressed during visit.  Please see discharge instructions below for further details of plan.  Discharge Instructions:   Discharge Instructions      You received an injection of Rocephin 1 g during your visit today.  This should significantly resolve any brewing bacterial infection in your urinary tract at this time.    We we will send your urine out for culture to evaluate for the presence of bacteria.  As we discussed, because it is late in the day and you been drinking a lot of water, the urine culture may be inconclusive.  For this reason, in the next 24 hours, if you continue to have increased frequency of urination and urinary tract discomfort, please go to the emergency room for more acute evaluation and treatment and we can provide here at urgent care.  As I reviewed your chart this evening, I appreciated a worrisome trend of progressively increasing blood glucose levels.  Please speak with your regular provider about being evaluated for diabetes again by performing a test called a Hemoglobin A1c.  The last time this was checked was in March 2022 and it was normal at 5.4.  At that time, your blood glucose levels were also essentially normal.  Your blood sugar levels have not been normal for  the past several months.  Thank you for visiting urgent care today.    This office  note has been dictated using Museum/gallery curator.  Unfortunately, this method of dictation can sometimes lead to typographical or grammatical errors.  I apologize for your inconvenience in advance if this occurs.  Please do not hesitate to reach out to me if clarification is needed.       Lynden Oxford Scales, PA-C 03/06/22 1238

## 2022-03-05 NOTE — Telephone Encounter (Signed)
Jiles Garter RN from [facility] stated that she spoke with Terrilee yesterday and with informed Corrine that they are ultimately going to need to do the increase the TPN to 6 days a week and get labs checked every two weeks:  Pt was contacted to discuss the change in TPN  along with getting the labs checked every 2 weeks: Pt stated that she does not mind getting the labs checked every two weeks but is concerned about the extra fluid on the extra day and extra hour each day:   Pt is requesting that Dr. Carlean Purl reach out to [place] at [place] along with Dr. Haroldine Laws to discuss the extra fluid and ensure that she will be OK with it:  Pt stated that she has an Appointment with Dr. Haroldine Laws on 03/18/2022  [facility] RN cell [phone]

## 2022-03-05 NOTE — Telephone Encounter (Signed)
I spoke to her TPN team including the dietitian.  ElenaRN and [name] [name] dietitian  The plan is to increase TPN to 6 days a week and reduce volume from 2000 mL to 1800 mL and hopefully reduce that further with a goal of 1600 mL.  The challenge is keeping her bicarb in a normal range which requires acetate.  The amount of acetate required has unnecessary amount of fluid as well so they need to make changes in stages and follow her labs.  This sounds reasonable to me.  I will review with [name] [name] I have tried to call her once but could not reach her.  I will also loop Dr. Haroldine [name] in.  [name] [name] has currently agreed to move to 9 hours of infusions, the TPN team is recommending a goal of 12 hours.  The thought is that infusing over a longer period of time would reduce the chances of volume overload in the setting of her cardiac comorbidities.

## 2022-03-05 NOTE — Discharge Instructions (Addendum)
You received an injection of Rocephin 1 g during your visit today.  This should significantly resolve any brewing bacterial infection in your urinary tract at this time.    We we will send your urine out for culture to evaluate for the presence of bacteria.  As we discussed, because it is late in the day and you been drinking a lot of water, the urine culture may be inconclusive.  For this reason, in the next 24 hours, if you continue to have increased frequency of urination and urinary tract discomfort, please go to the emergency room for more acute evaluation and treatment and we can provide here at urgent care.  As I reviewed your chart this evening, I appreciated a worrisome trend of progressively increasing blood glucose levels.  Please speak with your regular provider about being evaluated for diabetes again by performing a test called a Hemoglobin A1c.  The last time this was checked was in March 2022 and it was normal at 5.4.  At that time, your blood glucose levels were also essentially normal.  Your blood sugar levels have not been normal for the past several months.  Thank you for visiting urgent care today.

## 2022-03-05 NOTE — ED Triage Notes (Signed)
The pt c/o possible UTI she and c/o urinary frequency, back pain, bilateral leg pain and general discomfort.  Started: 2 days ago   Home interventions: AZO (lase dose this afternoon)  The patient is on TPN.

## 2022-03-05 NOTE — Telephone Encounter (Signed)
Patient called states she would like you to give her a call back as soon as possible. Thank you

## 2022-03-06 DIAGNOSIS — G9341 Metabolic encephalopathy: Secondary | ICD-10-CM | POA: Diagnosis not present

## 2022-03-06 DIAGNOSIS — K639 Disease of intestine, unspecified: Secondary | ICD-10-CM | POA: Diagnosis not present

## 2022-03-06 DIAGNOSIS — G928 Other toxic encephalopathy: Secondary | ICD-10-CM | POA: Diagnosis not present

## 2022-03-06 DIAGNOSIS — T361X5D Adverse effect of cephalosporins and other beta-lactam antibiotics, subsequent encounter: Secondary | ICD-10-CM | POA: Diagnosis not present

## 2022-03-06 DIAGNOSIS — I5042 Chronic combined systolic (congestive) and diastolic (congestive) heart failure: Secondary | ICD-10-CM | POA: Diagnosis not present

## 2022-03-06 DIAGNOSIS — K912 Postsurgical malabsorption, not elsewhere classified: Secondary | ICD-10-CM | POA: Diagnosis not present

## 2022-03-06 LAB — EXTRA SPECIMEN

## 2022-03-06 LAB — VITAMIN E
Gamma-Tocopherol (Vit E): 1.7 mg/L (ref <=4.3)
Vitamin E (Alpha Tocopherol): 14.6 mg/L (ref 5.7–19.9)

## 2022-03-06 LAB — COPPER, BLOOD

## 2022-03-06 MED ORDER — ERGOCALCIFEROL 200 MCG/ML PO SOLN: 48000.0000 [IU] | Freq: Every day | ORAL | 3 refills | Status: AC

## 2022-03-06 NOTE — Telephone Encounter (Signed)
Spoke to patient and explained rationale for longer TPN infusions and 6 d/week   [name] [name] is ok with this - I told her we may want to go up to 12 hr infusions  Also advised she see [name] given hyperglycemia -  Think she will need a glucometer to monitor BS off and on TPN and medical tx for new dx DM  TPN team will switch to more lipid and less glucose in formula also

## 2022-03-07 ENCOUNTER — Encounter: Payer: Self-pay | Admitting: Family Medicine

## 2022-03-07 ENCOUNTER — Other Ambulatory Visit: Payer: Self-pay

## 2022-03-07 ENCOUNTER — Other Ambulatory Visit (INDEPENDENT_AMBULATORY_CARE_PROVIDER_SITE_OTHER): Payer: Medicare Other

## 2022-03-07 ENCOUNTER — Other Ambulatory Visit: Payer: Self-pay | Admitting: Family Medicine

## 2022-03-07 DIAGNOSIS — R739 Hyperglycemia, unspecified: Secondary | ICD-10-CM | POA: Diagnosis not present

## 2022-03-07 DIAGNOSIS — E1069 Type 1 diabetes mellitus with other specified complication: Secondary | ICD-10-CM

## 2022-03-07 LAB — HEMOGLOBIN A1C: Hgb A1c MFr Bld: 6.3 % (ref 4.6–6.5)

## 2022-03-07 LAB — BASIC METABOLIC PANEL
BUN: 28 mg/dL — ABNORMAL HIGH (ref 6–23)
CO2: 27 mEq/L (ref 19–32)
Calcium: 8.5 mg/dL (ref 8.4–10.5)
Chloride: 99 mEq/L (ref 96–112)
Creatinine, Ser: 1.66 mg/dL — ABNORMAL HIGH (ref 0.40–1.20)
GFR: 30.78 mL/min — ABNORMAL LOW (ref >60.00)
Glucose, Bld: 96 mg/dL (ref 70–99)
Potassium: 4.5 mEq/L (ref 3.5–5.1)
Sodium: 132 mEq/L — ABNORMAL LOW (ref 135–145)

## 2022-03-07 LAB — VITAMIN A: Vitamin A (Retinoic Acid): 36 ug/dL — ABNORMAL LOW (ref 38–98)

## 2022-03-07 NOTE — Progress Notes (Signed)
Dx code changed

## 2022-03-07 NOTE — Telephone Encounter (Signed)
I spoke with the pt and she is coming in today for have her A1C and BMP drew . Lab orders are in

## 2022-03-09 ENCOUNTER — Encounter: Payer: Self-pay | Admitting: Internal Medicine

## 2022-03-09 LAB — URINE CULTURE: Culture: 70000 — AB

## 2022-03-10 ENCOUNTER — Other Ambulatory Visit: Payer: Self-pay

## 2022-03-10 ENCOUNTER — Telehealth: Payer: Self-pay

## 2022-03-10 ENCOUNTER — Ambulatory Visit: Payer: Self-pay

## 2022-03-10 DIAGNOSIS — R300 Dysuria: Secondary | ICD-10-CM

## 2022-03-10 DIAGNOSIS — D638 Anemia in other chronic diseases classified elsewhere: Secondary | ICD-10-CM

## 2022-03-10 DIAGNOSIS — K90829 Short bowel syndrome, unspecified: Secondary | ICD-10-CM

## 2022-03-10 DIAGNOSIS — N390 Urinary tract infection, site not specified: Secondary | ICD-10-CM

## 2022-03-10 DIAGNOSIS — K760 Fatty (change of) liver, not elsewhere classified: Secondary | ICD-10-CM

## 2022-03-10 LAB — CARNITINE / ACYLCARNITINE PROFILE, BLD

## 2022-03-10 MED ORDER — CIPROFLOXACIN HCL 250 MG PO TABS: 250.0000 mg | ORAL_TABLET | Freq: Two times a day (BID) | ORAL | 0 refills | Status: DC

## 2022-03-10 NOTE — Telephone Encounter (Signed)
Spoke to the pt and informed her of lab results

## 2022-03-10 NOTE — Telephone Encounter (Signed)
Pt states that she still is having the symptoms from the UTI. Pt stated that she had got a shot in the Urgent care: Pt stated that yesterday she was having back back  pain and along with chills and only did 3 hours of the TPN infusion yesterday:  Please advise:

## 2022-03-10 NOTE — Telephone Encounter (Signed)
Pt made aware of [facility] RN and Dr. Carlean Purl recommendations to repeat labs every 2 weeks: Pt notified to come have labs drawn on 03/12/2022 : Orders for labs placed in Epic: Pt made aware: Pt verbalized understanding with all questions answered.

## 2022-03-10 NOTE — Telephone Encounter (Signed)
-----   Message from [name], MD sent at 03/10/2022  7:32 AM EDT ----- Sodium is stable, kidney function is improving, and A1C is in the pre-diabetes range

## 2022-03-10 NOTE — Telephone Encounter (Signed)
Please disregard previous note: I see recommendations in Result Notes

## 2022-03-13 ENCOUNTER — Other Ambulatory Visit (INDEPENDENT_AMBULATORY_CARE_PROVIDER_SITE_OTHER): Payer: Medicare Other

## 2022-03-13 ENCOUNTER — Telehealth: Payer: Self-pay

## 2022-03-13 DIAGNOSIS — N390 Urinary tract infection, site not specified: Secondary | ICD-10-CM | POA: Diagnosis not present

## 2022-03-13 DIAGNOSIS — T361X5D Adverse effect of cephalosporins and other beta-lactam antibiotics, subsequent encounter: Secondary | ICD-10-CM | POA: Diagnosis not present

## 2022-03-13 DIAGNOSIS — R300 Dysuria: Secondary | ICD-10-CM

## 2022-03-13 DIAGNOSIS — K90829 Short bowel syndrome, unspecified: Secondary | ICD-10-CM | POA: Diagnosis not present

## 2022-03-13 DIAGNOSIS — R710 Precipitous drop in hematocrit: Secondary | ICD-10-CM

## 2022-03-13 DIAGNOSIS — I5042 Chronic combined systolic (congestive) and diastolic (congestive) heart failure: Secondary | ICD-10-CM | POA: Diagnosis not present

## 2022-03-13 DIAGNOSIS — D638 Anemia in other chronic diseases classified elsewhere: Secondary | ICD-10-CM

## 2022-03-13 DIAGNOSIS — K760 Fatty (change of) liver, not elsewhere classified: Secondary | ICD-10-CM | POA: Diagnosis not present

## 2022-03-13 DIAGNOSIS — D696 Thrombocytopenia, unspecified: Secondary | ICD-10-CM

## 2022-03-13 DIAGNOSIS — K639 Disease of intestine, unspecified: Secondary | ICD-10-CM | POA: Diagnosis not present

## 2022-03-13 DIAGNOSIS — K912 Postsurgical malabsorption, not elsewhere classified: Secondary | ICD-10-CM | POA: Diagnosis not present

## 2022-03-13 DIAGNOSIS — G9341 Metabolic encephalopathy: Secondary | ICD-10-CM | POA: Diagnosis not present

## 2022-03-13 DIAGNOSIS — G928 Other toxic encephalopathy: Secondary | ICD-10-CM | POA: Diagnosis not present

## 2022-03-13 LAB — COMPREHENSIVE METABOLIC PANEL
ALT: 12 U/L (ref 0–35)
AST: 13 U/L (ref 0–37)
Albumin: 3 g/dL — ABNORMAL LOW (ref 3.5–5.2)
Alkaline Phosphatase: 135 U/L — ABNORMAL HIGH (ref 39–117)
BUN: 23 mg/dL (ref 6–23)
CO2: 26 mEq/L (ref 19–32)
Calcium: 8.4 mg/dL (ref 8.4–10.5)
Chloride: 102 mEq/L (ref 96–112)
Creatinine, Ser: 1.51 mg/dL — ABNORMAL HIGH (ref 0.40–1.20)
GFR: 34.48 mL/min — ABNORMAL LOW (ref >60.00)
Glucose, Bld: 97 mg/dL (ref 70–99)
Potassium: 4.2 mEq/L (ref 3.5–5.1)
Sodium: 134 mEq/L — ABNORMAL LOW (ref 135–145)
Total Bilirubin: 0.6 mg/dL (ref 0.2–1.2)
Total Protein: 7.5 g/dL (ref 6.0–8.3)

## 2022-03-13 LAB — URINALYSIS, ROUTINE W REFLEX MICROSCOPIC
Bilirubin Urine: NEGATIVE
Ketones, ur: NEGATIVE
Leukocytes,Ua: NEGATIVE
Nitrite: NEGATIVE
Specific Gravity, Urine: <=1.005 — AB (ref 1.000–1.030)
Total Protein, Urine: NEGATIVE
Urine Glucose: NEGATIVE
Urobilinogen, UA: 0.2 (ref 0.0–1.0)
pH: 5.5 (ref 5.0–8.0)

## 2022-03-13 LAB — CBC WITH DIFFERENTIAL/PLATELET
Basophils Absolute: 0.1 10*3/uL (ref 0.0–0.1)
Basophils Relative: 0.6 % (ref 0.0–3.0)
Eosinophils Absolute: 0.1 10*3/uL (ref 0.0–0.7)
Eosinophils Relative: 1.4 % (ref 0.0–5.0)
HCT: 24 % — ABNORMAL LOW (ref 36.0–46.0)
Hemoglobin: 7.9 g/dL — CL (ref 12.0–15.0)
Lymphocytes Relative: 15.6 % (ref 12.0–46.0)
Lymphs Abs: 1.4 10*3/uL (ref 0.7–4.0)
MCHC: 32.8 g/dL (ref 30.0–36.0)
MCV: 94 fl (ref 78.0–100.0)
Monocytes Absolute: 0.7 10*3/uL (ref 0.1–1.0)
Monocytes Relative: 8.2 % (ref 3.0–12.0)
Neutro Abs: 6.4 10*3/uL (ref 1.4–7.7)
Neutrophils Relative %: 74.2 % (ref 43.0–77.0)
Platelets: 62 10*3/uL — ABNORMAL LOW (ref 150.0–400.0)
RBC: 2.56 Mil/uL — ABNORMAL LOW (ref 3.87–5.11)
RDW: 17.3 % — ABNORMAL HIGH (ref 11.5–15.5)
WBC: 8.6 10*3/uL (ref 4.0–10.5)

## 2022-03-13 LAB — PHOSPHORUS: Phosphorus: 3.6 mg/dL (ref 2.3–4.6)

## 2022-03-13 LAB — TRIGLYCERIDES: Triglycerides: 228 mg/dL — ABNORMAL HIGH (ref 0.0–149.0)

## 2022-03-13 LAB — MAGNESIUM: Magnesium: 1.9 mg/dL (ref 1.5–2.5)

## 2022-03-13 NOTE — Telephone Encounter (Signed)
Call from the lab.  Hgb 7.9 Results are not posted yet.

## 2022-03-13 NOTE — Telephone Encounter (Signed)
I called the patient because she has a hemoglobin of 7.9 down from a hemoglobin of 9.52 weeks ago.  She has not seen any signs of bleeding.  She has had a UTI and received ceftriaxone and now Cipro and she is still having some back pain and bladder symptoms with urgency.  Her urinalysis looks improved a culture is pending.  She will come for repeat CBC tomorrow.  Platelets were lower as well at 60,000.

## 2022-03-14 ENCOUNTER — Encounter (HOSPITAL_BASED_OUTPATIENT_CLINIC_OR_DEPARTMENT_OTHER): Payer: Self-pay | Admitting: Emergency Medicine

## 2022-03-14 ENCOUNTER — Other Ambulatory Visit (HOSPITAL_BASED_OUTPATIENT_CLINIC_OR_DEPARTMENT_OTHER): Payer: Self-pay

## 2022-03-14 ENCOUNTER — Emergency Department (HOSPITAL_BASED_OUTPATIENT_CLINIC_OR_DEPARTMENT_OTHER)
Admission: EM | Admit: 2022-03-14 | Discharge: 2022-03-14 | Disposition: A | Discharge status: Home / Self Care | Payer: Medicare Other | Attending: Emergency Medicine | Admitting: Emergency Medicine

## 2022-03-14 ENCOUNTER — Other Ambulatory Visit: Payer: Self-pay

## 2022-03-14 ENCOUNTER — Other Ambulatory Visit (INDEPENDENT_AMBULATORY_CARE_PROVIDER_SITE_OTHER): Payer: Medicare Other

## 2022-03-14 ENCOUNTER — Emergency Department (HOSPITAL_BASED_OUTPATIENT_CLINIC_OR_DEPARTMENT_OTHER): Payer: Medicare Other

## 2022-03-14 DIAGNOSIS — Z7901 Long term (current) use of anticoagulants: Secondary | ICD-10-CM | POA: Insufficient documentation

## 2022-03-14 DIAGNOSIS — K6389 Other specified diseases of intestine: Secondary | ICD-10-CM | POA: Diagnosis not present

## 2022-03-14 DIAGNOSIS — D696 Thrombocytopenia, unspecified: Secondary | ICD-10-CM

## 2022-03-14 DIAGNOSIS — M549 Dorsalgia, unspecified: Secondary | ICD-10-CM | POA: Insufficient documentation

## 2022-03-14 DIAGNOSIS — R161 Splenomegaly, not elsewhere classified: Secondary | ICD-10-CM | POA: Diagnosis not present

## 2022-03-14 DIAGNOSIS — I517 Cardiomegaly: Secondary | ICD-10-CM | POA: Diagnosis not present

## 2022-03-14 DIAGNOSIS — R3915 Urgency of urination: Secondary | ICD-10-CM | POA: Diagnosis not present

## 2022-03-14 DIAGNOSIS — R609 Edema, unspecified: Secondary | ICD-10-CM | POA: Insufficient documentation

## 2022-03-14 DIAGNOSIS — R350 Frequency of micturition: Secondary | ICD-10-CM | POA: Insufficient documentation

## 2022-03-14 DIAGNOSIS — R710 Precipitous drop in hematocrit: Secondary | ICD-10-CM | POA: Diagnosis not present

## 2022-03-14 DIAGNOSIS — I700 Atherosclerosis of aorta: Secondary | ICD-10-CM | POA: Diagnosis not present

## 2022-03-14 DIAGNOSIS — Z79899 Other long term (current) drug therapy: Secondary | ICD-10-CM | POA: Insufficient documentation

## 2022-03-14 DIAGNOSIS — R300 Dysuria: Secondary | ICD-10-CM | POA: Diagnosis not present

## 2022-03-14 DIAGNOSIS — M79606 Pain in leg, unspecified: Secondary | ICD-10-CM | POA: Insufficient documentation

## 2022-03-14 DIAGNOSIS — D649 Anemia, unspecified: Secondary | ICD-10-CM | POA: Diagnosis not present

## 2022-03-14 DIAGNOSIS — N390 Urinary tract infection, site not specified: Secondary | ICD-10-CM

## 2022-03-14 DIAGNOSIS — R6883 Chills (without fever): Secondary | ICD-10-CM | POA: Diagnosis not present

## 2022-03-14 LAB — URINALYSIS, ROUTINE W REFLEX MICROSCOPIC
Bilirubin Urine: NEGATIVE
Glucose, UA: NEGATIVE mg/dL
Ketones, ur: NEGATIVE mg/dL
Leukocytes,Ua: NEGATIVE
Nitrite: NEGATIVE
Protein, ur: NEGATIVE mg/dL
Specific Gravity, Urine: 1.015 (ref 1.005–1.030)
pH: 5.5 (ref 5.0–8.0)

## 2022-03-14 LAB — CBC WITH DIFFERENTIAL/PLATELET
Basophils Absolute: 0.1 10*3/uL (ref 0.0–0.1)
Basophils Relative: 0.8 % (ref 0.0–3.0)
Eosinophils Absolute: 0.2 10*3/uL (ref 0.0–0.7)
Eosinophils Relative: 1.5 % (ref 0.0–5.0)
HCT: 24.3 % — ABNORMAL LOW (ref 36.0–46.0)
Hemoglobin: 7.9 g/dL — CL (ref 12.0–15.0)
Lymphocytes Relative: 16 % (ref 12.0–46.0)
Lymphs Abs: 1.8 10*3/uL (ref 0.7–4.0)
MCHC: 32.5 g/dL (ref 30.0–36.0)
MCV: 94.2 fl (ref 78.0–100.0)
Monocytes Absolute: 1.1 10*3/uL — ABNORMAL HIGH (ref 0.1–1.0)
Monocytes Relative: 9.2 % (ref 3.0–12.0)
Neutro Abs: 8.4 10*3/uL — ABNORMAL HIGH (ref 1.4–7.7)
Neutrophils Relative %: 72.5 % (ref 43.0–77.0)
Platelets: 64 10*3/uL — ABNORMAL LOW (ref 150.0–400.0)
RBC: 2.58 Mil/uL — ABNORMAL LOW (ref 3.87–5.11)
RDW: 17.4 % — ABNORMAL HIGH (ref 11.5–15.5)
WBC: 11.5 10*3/uL — ABNORMAL HIGH (ref 4.0–10.5)

## 2022-03-14 LAB — URINALYSIS, MICROSCOPIC (REFLEX)

## 2022-03-14 LAB — PREALBUMIN: Prealbumin: 17 mg/dL (ref 17–34)

## 2022-03-14 MED ORDER — CEFAZOLIN SODIUM 1 G IM
1.0000 g | Freq: Once | INTRAMUSCULAR | Status: AC
  Administered 2022-03-14: 1 g via INTRAMUSCULAR
  Filled 2022-03-14: qty 10

## 2022-03-14 MED ORDER — CIPROFLOXACIN HCL 250 MG PO TABS
250.0000 mg | ORAL_TABLET | Freq: Two times a day (BID) | ORAL | 0 refills | Status: AC
  Filled 2022-03-14: qty 10, 5d supply, fill #0

## 2022-03-14 MED ORDER — STERILE WATER FOR INJECTION IJ SOLN
INTRAMUSCULAR | Status: AC
  Administered 2022-03-14: 2.5 mL
  Filled 2022-03-14: qty 10

## 2022-03-14 NOTE — Telephone Encounter (Signed)
I have spoken to patient - no signs of bleeding  WBC higher and still w/ UTI sxs - ? Complicated UTI as not improving  Am asking PCP if possible to see her today vs urgent care or ED

## 2022-03-14 NOTE — ED Triage Notes (Signed)
Pt c/o ongoing urinary urgency. Recently treated for UTI by pcp with cipro. Pt states she is unable to complete TPN infusion for the past 3 days due to having "chills" Concerned for infection.

## 2022-03-14 NOTE — ED Provider Notes (Signed)
[place] EMERGENCY DEPARTMENT Provider Note   CSN: [id number] Arrival date & time: 03/14/22  1303     History {Add pertinent medical, surgical, social history, OB history to HPI:1} Chief Complaint  Patient presents with   Urinary Frequency    [name] [name] is a 71 y.o. female.  He has a complex medical history including short-bowel syndrome requiring TPN.  [name] [name] has a line in place.  [name] [name] has been troubled with 2 weeks of urinary symptoms pain in her back and legs.  [name] [name] said [name] [name] felt better after an IM dose of Kefzol and is on oral Cipro.  Urine culture looks like it grew back Enterococcus.  [name] [name] saw her GI doctor yesterday had labs showing a low hemoglobin and this was repeated today.  [name] [name] said [name] [name] has been feeling some chills with her TPN infusions although not sure [name] [name] has been having a fever.  [name] [name] said her doctor sent her here for further evaluation to see if [name] [name] needed another dose of IV antibiotics.  [name] [name] does not want further blood work if possible and only wants an IM dose of medication.  [name] [name] does not endorse any bleeding.  [name] [name] has an appointment with her oncologist coming up to discuss her low blood count.  The history is provided by the patient.  Urinary Frequency This is a new problem. The current episode started more than 1 week ago. The problem occurs hourly. The problem has not changed since onset.Pertinent negatives include no chest pain, no headaches and no shortness of breath. Nothing aggravates the symptoms. Nothing relieves the symptoms. [name] [name] has tried rest (oral antibiotics) for the symptoms. The treatment provided mild relief.       Home Medications Prior to Admission medications   Medication Sig Start Date End Date Taking? Authorizing Provider  acetaminophen (TYLENOL) 500 MG tablet Take 1,000 mg by mouth every 6 (six) hours as needed for mild pain.    [provider]  ADULT TPN Inject 1,800 mLs into the vein See admin instructions. Pt receives home TPN  from [facility]:  1800 mL bag, five nights weekly (Monday, Tuesday, Wednesday, Thursday and Friday for 8 hours (includes 1 hour taper up and down).    [provider]  Calcium Carb-Cholecalciferol (CALCIUM + D3 PO) Take 1 tablet by mouth in the morning and at bedtime.    [provider]  cetirizine (ZYRTEC) 10 MG tablet Take 10 mg by mouth daily as needed for allergies.    [provider]  ciprofloxacin (CIPRO) 250 MG tablet Take 1 tablet (250 mg total) by mouth 2 (two) times daily for 5 days. 03/10/22 03/15/22  Gatha Mayer, MD  clobetasol ointment (TEMOVATE) 4.19 % Apply 1 application  topically 2 (two) times daily as needed (rash). 06/14/21   [provider]  empagliflozin (JARDIANCE) 10 MG TABS tablet Take 1 tablet (10 mg total) by mouth daily before breakfast. 03/03/22   Bensimhon, Shaune Pascal, MD  ergocalciferol, VITAMIN D2, (DRISDOL) 200 MCG/ML drops Take 6 mLs (48,000 Units total) by mouth daily. 03/06/22   Gatha Mayer, MD  furosemide (LASIX) 40 MG tablet Take 1 tablet (40 mg total) by mouth 2 (two) times daily. Morning & early afternoon. 02/06/22   Bonnielee Haff, MD  Heparin Sodium, Porcine, (HEPARIN LOCK FLUSH IJ) Inject 5 mLs as directed See admin instructions. 5 ml with TPN infusion on Monday,Tuesday,Wednesday, Thursday, Friday.    [provider]  losartan (COZAAR) 25 MG tablet Take 0.5  tablets (12.5 mg total) by mouth at bedtime. Please resume only if your kidney function is stable. Your PCP or cardiologist should advice you. 02/12/22   Bonnielee Haff, MD  omeprazole (PRILOSEC) 40 MG capsule TAKE 1 CAPSULE BY MOUTH EVERY DAY BEFORE BREAKFAST 12/27/21   Gatha Mayer, MD  ondansetron (ZOFRAN-ODT) 8 MG disintegrating tablet TAKE 1 TABLET BY MOUTH EVERY 8 HOURS AS NEEDED FOR NAUSEA OR VOMITING. 09/09/21   Gatha Mayer, MD  Pediatric Multivit-Minerals (FLINTSTONES GUMMIES PO) Take 4 tablets by mouth in the morning.    [provider]   potassium chloride SA (KLOR-CON M20) 20 MEQ tablet Take 1 tablet (20 mEq total) by mouth daily. 12/16/21   Bensimhon, Shaune Pascal, MD  sodium chloride 0.9 % infusion Inject 900 mLs into the vein See admin instructions. 900 ml IV as needed for dehydration on TPN infusion days (Monday, Tuesday, Wednesday, Thursday, Friday) 10/27/17   [provider]  spironolactone (ALDACTONE) 25 MG tablet TAKE 1 TABLET (25 MG TOTAL) BY MOUTH DAILY. 08/28/21   Bensimhon, Shaune Pascal, MD  Teduglutide, rDNA, 5 MG KIT Inject 3.31 Units into the skin every morning. 10/17/21   Gatha Mayer, MD  vitamin E 1000 UNIT capsule Take 1,000 Units by mouth in the morning.    [provider]  XARELTO 20 MG TABS tablet TAKE 1 TABLET BY MOUTH DAILY WITH SUPPER. 08/30/21   [name], MD      Allergies    Cefepime and Ivp dye [iodinated contrast media]    Review of Systems   Review of Systems  Constitutional:  Positive for chills. Negative for fever.  Respiratory:  Negative for shortness of breath.   Cardiovascular:  Negative for chest pain.  Gastrointestinal:  Positive for diarrhea (chronic).  Genitourinary:  Positive for frequency.  Musculoskeletal:  Positive for back pain.  Skin:  Negative for rash.  Neurological:  Negative for headaches.    Physical Exam Updated Vital Signs BP (!) 99/55   Pulse 83   Temp 97.8 F (36.6 C) (Oral)   Resp 15   Ht _0  (1.727 m)   Wt 64.9 kg   LMP 02/26/2014   SpO2 (!) 72%   BMI 21.74 kg/m  Physical Exam Vitals and nursing note reviewed.  Constitutional:      General: [name] [name] is not in acute distress.    Appearance: Normal appearance. [name] [name] is well-developed.  HENT:     Head: Normocephalic and atraumatic.  Eyes:     Conjunctiva/sclera: Conjunctivae normal.  Cardiovascular:     Rate and Rhythm: Normal rate and regular rhythm.     Heart sounds: No murmur heard. Pulmonary:     Effort: Pulmonary effort is normal. No respiratory distress.     Breath sounds:  Normal breath sounds.     Comments: [name] [name] has a port in her left upper chest without surrounding erythema Abdominal:     Palpations: Abdomen is soft.     Tenderness: There is no abdominal tenderness. There is no guarding or rebound.  Musculoskeletal:        General: Normal range of motion.     Cervical back: Neck supple.  Skin:    General: Skin is warm and dry.     Capillary Refill: Capillary refill takes less than 2 seconds.  Neurological:     General: No focal deficit present.     Mental Status: [name] [name] is alert.     Sensory: No sensory deficit.  Motor: No weakness.     ED Results / Procedures / Treatments   Labs (all labs ordered are listed, but only abnormal results are displayed) Labs Reviewed  URINE CULTURE  CULTURE, BLOOD (ROUTINE X 2)  CULTURE, BLOOD (ROUTINE X 2)  URINALYSIS, ROUTINE W REFLEX MICROSCOPIC    EKG None  Radiology No results found.  Procedures Procedures  {Document cardiac monitor, telemetry assessment procedure when appropriate:1}  Medications Ordered in ED Medications  ceFAZolin (ANCEF) injection 1 g (has no administration in time range)    ED Course/ Medical Decision Making/ A&P                           Medical Decision Making Amount and/or Complexity of Data Reviewed Labs: ordered. Radiology: ordered.  Risk Prescription drug management.   This patient complains of ***; this involves an extensive number of treatment Options and is a complaint that carries with it a high risk of complications and morbidity. The differential includes ***  I ordered, reviewed and interpreted labs, which included *** I ordered medication *** and reviewed PMP when indicated. I ordered imaging studies which included *** and I independently    visualized and interpreted imaging which showed *** Additional history obtained from *** Previous records obtained and reviewed *** I consulted *** and discussed lab and imaging findings and discussed  disposition.  Cardiac monitoring reviewed, *** Social determinants considered, *** Critical Interventions: ***  After the interventions stated above, I reevaluated the patient and found *** Admission and further testing considered, ***   {Document critical care time when appropriate:1} {Document review of labs and clinical decision tools ie heart score, Chads2Vasc2 etc:1}  {Document your independent review of radiology images, and any outside records:1} {Document your discussion with family members, caretakers, and with consultants:1} {Document social determinants of health affecting pt's care:1} {Document your decision making why or why not admission, treatments were needed:1} Final Clinical Impression(s) / ED Diagnoses Final diagnoses:  None    Rx / DC Orders ED Discharge Orders     None

## 2022-03-14 NOTE — Telephone Encounter (Signed)
Received a call from Lab about critical lab- hemoglobin of 7.9 for this patient. Please Advise.

## 2022-03-14 NOTE — Telephone Encounter (Signed)
Please see note below and advise  

## 2022-03-14 NOTE — Discharge Instructions (Addendum)
You were seen in the emergency department for continued urinary symptoms and pain in your back and legs.  You also had some blood work done with your GI doctor that showed your red blood cell count to be low.  We have sent off some blood cultures and giving you a dose of antibiotics.  Will be important for you to follow-up with your specialist for continued work-up of your symptoms.  You did have a CAT scan that did not show any obvious explanation for your urinary symptoms.  There were some incidental findings that will need follow-up.  Included below is the report of the CAT scan.  IMPRESSION:  1. Air-levels in the distal colon compatible with diarrheal process.  2. Small bowel resections with overall shortening of the bowel, does  the patient have short-bowel syndrome?  3. 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  4. Low-density blood pool compatible with anemia.  5. Moderate cardiomegaly. Fat density along the cardiac apex and  septum favoring prior myocardial infarction.  6. Mild splenomegaly.  7. Difficult to assess patency of the mesenteric vessels on today's  noncontrast exam, but the SMA seems very small in caliber and of  questionable patency. The inferior mesenteric artery seems large in  caliber, possibly to compensate for the small SMA collateral  vessels. Predisposition for chronic mesenteric ischemia not excluded  although there no current substantial bowel sign such as bowel wall  thickening or fluid along the bowel wall. Correlate with other  clinical indicators such as weight loss or postprandial abdominal  pain. The prior CT angiogram of 44/81/8563 was complicated by poor  signal to noise ratio, suboptimal contrast phase, and motion  artifact.  8. Fluid density 5.0 by 4.5 cm left adnexal cystic lesion. Recommend  follow-up US in 6-12 months.  9. Edema overlying the coccyx dorsally, cannot exclude decubitus  ulcer, correlate with visual  inspection.  10. Stable flat rim calcified type structure along the right rectus  abdominus muscle, internal fat density hence I favor heterotopic  ossification over gossypiboma.  11. Aortic atherosclerosis.    Aortic Atherosclerosis (ICD10-I70.0).

## 2022-03-15 LAB — URINE CULTURE
Culture: NO GROWTH
MICRO NUMBER:: 14135695
Result:: NO GROWTH
SPECIMEN QUALITY:: ADEQUATE

## 2022-03-17 NOTE — Progress Notes (Signed)
ADVANCED HF CLINIC NOTE  Referring Physician: Dr. Johnsie Cancel Primary Care: None Primary Cardiologist: Dr. Johnsie Cancel  HPI: [name] [name] is a 71 y.o. 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 Johnsie Cancel for further evalaution of her progressive cardiomyopathy.    Echo 2015  EF 45% with inferior wall and apical HK - she did not want to have heart cath. Myoview done at that time showed inferior/ apical scar with no ischemia   Echo 12/03/16 EF 40-45% AV sclerosis mean gradient 6 mmHg  She saw Dr. Johnsie Cancel 10/21. Echo 11/21 EF 20-25% mild AS mean gradient 10. Referred here   She saw Dr Donzetta Matters [facility] for RUE swelling duplex with occlusive thrombus in right IJ chronic with small right subclavian antegrade flow. Deferred any venography intervention  Cath 2/22: EF 35-40% Mild CAD  (pLAD 30%, mLCX 30%, dRCA 30%)ejection fraction. EF 35-40% with inferoapical aneurysm suggestive of prior infarct. No RHC attempted due to known lack of venous access  Admitted 3/22 with acute CVA/TIA with left-sided weakness. MRI of the brain, numerous small scattered acute cortical and subcortical infarct within the right cerebral hemisphere, right MCA territory as well as ACA and PCA territory. CTA of the head and neck, no large vessel occlusion.  70% atheromatous stenosis at the right carotid bulb.  50% stenosis at the left carotid bulb.  Echo 07/24/20 EF 35-40% with apical HK  no bubble done.  cMRI 4/22  EF 30% Akinesis of the mid-apical inferior wall, apical septum and aneurysm of the LV apex. LGE findings suggest prior infarct with scar with no viability in the mid inferior wall and LV apex. 2.  RV normal EF 49% 3.  No LV apical thrombus.  Echo 1/23 EF 35-40% Mild AS/mild to  moderate AI Echo 12/16/21: EF 35-40% RV ok.  Severe MR. Mild to moderate AI/mild AS  Admitted in 9/23 with sepsis. Seen in ED 02/04/22 after overdose attempt with [facility] following an argument with her husband.   Underwent attempted mTEER on 02/13/22 but procedure aborted due to development of thrombus on the right atrial side of the interatrial septum.   Here for f/u.     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/08/2012   Carnitine deficiency ([place]) 05/25/2018   Carotid stenosis    Carotid US (9/15):  R 40-59%; L 1-39% >> FU 1 year   Cataract    Closed left subtrochanteric femur fracture ([place]) 12/31/2017   Clotting disorder ([place])    Congestive heart failure (CHF) (HCC)    EF 25-30%   COVID-19 2022   Deep venous thrombosis ([place]) left subclavian vein 07/31/2017   Fever 10/29/2020   Fracture of left clavicle    GERD (gastroesophageal reflux disease)    History of blood transfusion 2013   anemia   Hx of cardiovascular stress test    Myoview (9/15):  inf-apical scar; no ischemia; EF 47% - low risk    Infection by Candida  species 12/12/2011   Osteoporosis    Pancytopenia 10/07/2011   Pathologic fracture of neck of femur ([place])    Peripheral vascular disease (HCC)    Personal history of colonic polyps    Renal insufficiency    hx of yrs ago   Serratia marcescens infection - bactermia assoc w/ PICC 01/18/2015   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HCC)    Splenomegaly    By ultrasound   Stroke (cerebrum) ([place]) 07/25/2020   Stroke ([place]) 07/2020   Thrombophilia (HCC)    Vitamin D deficiency     Current Outpatient Medications  Medication Sig Dispense Refill   acetaminophen (TYLENOL)  500 MG tablet Take 1,000 mg by mouth every 6 (six) hours as needed for mild pain.     ADULT TPN Inject 1,800 mLs into the vein See admin instructions. Pt receives home TPN from [facility]:  1800 mL bag, five nights weekly (Monday, Tuesday, Wednesday, Thursday and Friday for 8 hours (includes 1 hour taper up and down).     Calcium Carb-Cholecalciferol (CALCIUM + D3 PO) Take 1 tablet by mouth in the morning and at bedtime.     cetirizine (ZYRTEC) 10 MG tablet Take 10 mg by mouth daily as needed for allergies.     ciprofloxacin (CIPRO) 250 MG tablet Take 1 tablet (250 mg total) by mouth 2 (two) times daily for 5 days. 10 tablet 0   clobetasol ointment (TEMOVATE) 5.09 % Apply 1 application  topically 2 (two) times daily as needed (rash).     empagliflozin (JARDIANCE) 10 MG TABS tablet Take 1 tablet (10 mg total) by mouth daily before breakfast. 90 tablet 3   ergocalciferol, VITAMIN D2, (DRISDOL) 200 MCG/ML drops Take 6 mLs (48,000 Units total) by mouth daily. 540 mL 3   furosemide (LASIX) 40 MG tablet Take 1 tablet (40 mg total) by mouth 2 (two) times daily. Morning & early afternoon.     Heparin Sodium, Porcine, (HEPARIN LOCK FLUSH IJ) Inject 5 mLs as directed See admin instructions. 5 ml with TPN infusion on Monday,Tuesday,Wednesday, Thursday, Friday.     losartan (COZAAR) 25 MG tablet Take 0.5 tablets (12.5 mg total) by mouth at bedtime. Please resume only if your kidney function is stable. Your PCP or cardiologist should advice you. 90 tablet 3   omeprazole (PRILOSEC) 40 MG capsule TAKE 1 CAPSULE BY MOUTH EVERY DAY BEFORE BREAKFAST 90 capsule 1   ondansetron (ZOFRAN-ODT) 8 MG disintegrating tablet TAKE 1 TABLET BY MOUTH EVERY 8 HOURS AS NEEDED FOR NAUSEA OR VOMITING. 30 tablet 11   Pediatric Multivit-Minerals (FLINTSTONES GUMMIES PO) Take 4 tablets by mouth in the morning.     potassium chloride SA (KLOR-CON M20) 20 MEQ tablet Take 1 tablet (20 mEq total) by mouth daily. 90 tablet 3   sodium chloride  0.9 % infusion Inject 900 mLs into the vein See admin instructions. 900 ml IV as needed for dehydration on TPN infusion days (Monday, Tuesday, Wednesday, Thursday, Friday)     spironolactone (ALDACTONE) 25 MG tablet TAKE 1 TABLET (25 MG TOTAL) BY MOUTH DAILY. 90 tablet 3   Teduglutide, rDNA, 5 MG KIT Inject 3.31 Units into the skin every morning. 1 kit 0   vitamin E 1000 UNIT capsule Take 1,000 Units by mouth in the morning.     XARELTO 20 MG TABS tablet TAKE 1 TABLET BY MOUTH DAILY WITH SUPPER. 90 tablet 2   No current facility-administered medications  for this encounter.    Allergies  Allergen Reactions   Cefepime Other (See Comments)    Severe encephalopathy   Ivp Dye [Iodinated Contrast Media] Itching    Can use if taking benadryl        Social History   Socioeconomic History   Marital status: Married    Spouse name: Not on file   Number of children: 1   Years of education: Not on file   Highest education level: Not on file  Occupational History   Not on file  Tobacco Use   Smoking status: Former    Types: Cigarettes    Quit date: 69    Years since quitting: 49.8   Smokeless tobacco: Never  Vaping Use   Vaping Use: Never used  Substance and Sexual Activity   Alcohol use: Never   Drug use: Never   Sexual activity: Yes    Comment: 1st intercourse 50 yo-5 partners  Other Topics Concern   Not on file  Social History Narrative   ** Merged History Encounter **       Married to [place], has 1 daughter and a granddaughter the patient's son died in childhood due to a motor vehicle wreck 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 No alcohol tobacco or drug use 04/07/2017    Social Determinants of Health   Financial Resource Strain: Medium Risk (12/30/2017)   Overall Financial Resource Strain (CARDIA)    Difficulty of Paying Living Expenses: Somewhat hard  Food Insecurity: No Food Insecurity (03/04/2022)   Hunger Vital Sign     Worried About Running Out of Food in the Last Year: Never true    Ran Out of Food in the Last Year: Never true  Transportation Needs: No Transportation Needs (03/04/2022)   PRAPARE - Hydrologist (Medical): No    Lack of Transportation (Non-Medical): No  Physical Activity: Insufficiently Active (12/30/2017)   Exercise Vital Sign    Days of Exercise per Week: 7 days    Minutes of Exercise per Session: 20 min  Stress: No Stress Concern Present (12/30/2017)   [place]    Feeling of Stress : Not at all  Social Connections: Moderately Integrated (12/30/2017)   Social Connection and Isolation Panel [NHANES]    Frequency of Communication with Friends and Family: More than three times a week    Frequency of Social Gatherings with Friends and Family: Three times a week    Attends Religious Services: 1 to 4 times per year    Active Member of Clubs or Organizations: No    Attends Archivist Meetings: Never    Marital Status: Married  Human resources officer Violence: Not on file      Family History  Problem Relation Age of Onset   Diabetes Mother    Hypertension Mother    AAA (abdominal aortic aneurysm) Mother    Colon cancer Neg Hx    Stomach cancer Neg Hx     There were no vitals filed for this visit.    Wt Readings from Last 3 Encounters:  03/14/22 64.9 kg (143 lb)  02/26/22 64 kg (141 lb 3.2 oz)  02/24/22 62.3 kg (137 lb 6.4 oz)     PHYSICAL EXAM: General:  Sitting on exam table. No resp difficulty HEENT: normal Neck: supple. JVP to jaw  Carotids 2+ bilat; no bruits. No lymphadenopathy or thryomegaly appreciated. Cor: PMI nondisplaced.  Regular rate & rhythm. 2/6 MR left chest PICC Lungs: clear Abdomen: soft, nontender, nondistended. No hepatosplenomegaly. No bruits or masses. Good bowel sounds. Extremities: no cyanosis, clubbing, rash, trace edema Neuro: alert & orientedx3,  cranial nerves grossly intact. moves all 4 extremities w/o difficulty. Affect pleasant  REDS 39%  ASSESSMENT & PLAN:  1. Systolic HF due to Ischemic cardiomyopathy - EF 45% echo 2015 RWMA inferior wall/apex - refused cath.  - Myoview 2015  EF 47% inferior/ apical scar with no ischemia - Echo 12/03/16 EF 40-45% AV sclerosis mean gradient 6 mmHg - Echo 11/21 EF 20-25% mild AS mean gradient 10  - Cath 06/26/20: EF 35-40% Mild CAD  (pLAD 30%, mLCX 30%, dRCA 30%). EF 35-40% with inferoapical aneurysm suggestive of prior infarct (depsite lack of significant CAD). No RHC attempted due to known lack of venous access - Echo 07/24/20 EF 35-40% with apical HK  no bubble done. Mild AS Personally reviewed - Echo 12/16/21: EF 35-40% severe MR. Mild AS - cMRI 4/22 EF 30% Akinesis of the mid-apical inferior wall, apical septum and aneurysm of the LV apex. LGE findings suggest prior infarct with scar with no viability in the mid inferior wall and LV apex. RV normal  - NYHA II-III. Volume status worse recently and remains elevated. Increase lasix to 80 daily. Add k20.  - Failed Entresto due to low BP.  - Continue losartan 25 daily  - Not on b-blocker due to bradycardia - Continue spiro 25 daily - Continue Jardiance 10 mg daily.  - Check BMET  - She has large infero-apical aneurysm in the absence of significant epicardial CAD. MRI confirms previous infarct. Possible thrombotic episode. Doubt she has LBBB cardiomyopathy as imaging doesn't match this  Likely not candidate for CRT-D due to diffuse venous occlusions and also high-risk of infection with ongoing TPN.  2. Severe mitral regurgitation  - TEE 9/23 with severe MR - Underwent attempted mTEER on 02/13/22 but procedure aborted due to development of thrombus on the right atrial side of the interatrial septum.  - Echo today with very severe MR which was not present on previous echos and cMRI last year.   3. CAD, non-obstructive - probable previous thrombotic  MI in past - No s/s chest pain.  - No ASA with Xarelto - Have held off on statin due to poor nutrition. But likely ok to proceed now  - Continue Jardiance  4. Hypercoaguable state - chronically occluded RIJ - h/o mesenteric infarct  - on Xarelto. No bleeding   5. 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 Has limited venous access - Per Dr Carlean Purl   6. LBBB (161m) - as above. Doubt she qualifies for CRT  7. Aortic stenosis/regurgitation - AS mild by echo mean gradient 15. Moderate AI - Continue to follow  8. H/o CVA - follows with Dr. SJanie Morning MD  10:47 PM

## 2022-03-18 ENCOUNTER — Ambulatory Visit (HOSPITAL_COMMUNITY)
Admission: RE | Admit: 2022-03-18 | Discharge: 2022-03-18 | Disposition: A | Discharge status: Home / Self Care | Payer: Medicare Other | Source: Ambulatory Visit | Attending: Internal Medicine | Admitting: Internal Medicine

## 2022-03-18 ENCOUNTER — Other Ambulatory Visit (HOSPITAL_COMMUNITY): Payer: Self-pay | Admitting: Internal Medicine

## 2022-03-18 ENCOUNTER — Encounter (HOSPITAL_COMMUNITY): Payer: Self-pay | Admitting: Internal Medicine

## 2022-03-18 ENCOUNTER — Inpatient Hospital Stay (HOSPITAL_COMMUNITY)
Admission: RE | Admit: 2022-03-18 | Discharge: 2022-03-18 | Disposition: A | Discharge status: Home / Self Care | Payer: Medicare Other | Source: Ambulatory Visit | Attending: Internal Medicine | Admitting: Internal Medicine

## 2022-03-18 VITALS — BP 100/50 | HR 41 | Wt 146.0 lb

## 2022-03-18 DIAGNOSIS — R001 Bradycardia, unspecified: Secondary | ICD-10-CM | POA: Insufficient documentation

## 2022-03-18 DIAGNOSIS — K90829 Short bowel syndrome, unspecified: Secondary | ICD-10-CM | POA: Insufficient documentation

## 2022-03-18 DIAGNOSIS — D6859 Other primary thrombophilia: Secondary | ICD-10-CM | POA: Diagnosis not present

## 2022-03-18 DIAGNOSIS — I447 Left bundle-branch block, unspecified: Secondary | ICD-10-CM

## 2022-03-18 DIAGNOSIS — I255 Ischemic cardiomyopathy: Secondary | ICD-10-CM | POA: Diagnosis not present

## 2022-03-18 DIAGNOSIS — I340 Nonrheumatic mitral (valve) insufficiency: Secondary | ICD-10-CM | POA: Diagnosis not present

## 2022-03-18 DIAGNOSIS — Z87891 Personal history of nicotine dependence: Secondary | ICD-10-CM | POA: Insufficient documentation

## 2022-03-18 DIAGNOSIS — L905 Scar conditions and fibrosis of skin: Secondary | ICD-10-CM | POA: Insufficient documentation

## 2022-03-18 DIAGNOSIS — Z7901 Long term (current) use of anticoagulants: Secondary | ICD-10-CM | POA: Insufficient documentation

## 2022-03-18 DIAGNOSIS — R0602 Shortness of breath: Secondary | ICD-10-CM | POA: Diagnosis not present

## 2022-03-18 DIAGNOSIS — D693 Immune thrombocytopenic purpura: Secondary | ICD-10-CM | POA: Diagnosis not present

## 2022-03-18 DIAGNOSIS — I2510 Atherosclerotic heart disease of native coronary artery without angina pectoris: Secondary | ICD-10-CM | POA: Diagnosis not present

## 2022-03-18 DIAGNOSIS — Z7984 Long term (current) use of oral hypoglycemic drugs: Secondary | ICD-10-CM | POA: Diagnosis not present

## 2022-03-18 DIAGNOSIS — I493 Ventricular premature depolarization: Secondary | ICD-10-CM

## 2022-03-18 DIAGNOSIS — Z79899 Other long term (current) drug therapy: Secondary | ICD-10-CM | POA: Diagnosis not present

## 2022-03-18 DIAGNOSIS — I5022 Chronic systolic (congestive) heart failure: Secondary | ICD-10-CM | POA: Diagnosis not present

## 2022-03-18 NOTE — Progress Notes (Signed)
Patient ambulated in hallway heart rate started @ 44 and increased to 62.

## 2022-03-18 NOTE — Patient Instructions (Signed)
Medication Changes:  None, continue current medications  Lab Work:  none  Testing/Procedures:  Your provider has recommended that  you wear a Zio Patch for 7 days.  This monitor will record your heart rhythm for our review.  IF you have any symptoms while wearing the monitor please press the button.  If you have any issues with the patch or you notice a red or orange light on it please call the company at [phone].  Once you remove the patch please mail it back to the company as soon as possible so we can get the results.  Referrals:  You have been referred to [facility], they will call you for an appointment  Special Instructions // Education:  Do the following things EVERYDAY: Weigh yourself in the morning before breakfast. Write it down and keep it in a log. Take your medicines as prescribed Eat low salt foods--Limit salt (sodium) to 2000 mg per day.  Stay as active as you can everyday Limit all fluids for the day to less than 2 liters   Follow-Up in: 4 months (March 2024), **PLEASE CALL OUR OFFICE IN [place] TO SCHEDULE THIS APPOINTMENT  At the [facility], you and your health needs are our priority. We have a designated team specialized in the treatment of Heart Failure. This Care Team includes your primary Heart Failure Specialized Cardiologist (physician), Advanced Practice Providers (APPs- Physician Assistants and Nurse Practitioners), and Pharmacist who all work together to provide you with the care you need, when you need it.   You may see any of the following providers on your designated Care Team at your next follow up:  Dr. Glori Bickers Dr. Loralie Champagne Dr. Roxana Hires, NP Lyda Jester, [place] [facility] [place], [place] Forestine Na, NP Audry Riles, PharmD   Please be sure to bring in all your medications bottles to every appointment.   Need to Contact us:  If you have any questions or concerns before  your next appointment please send [place] a message through [place] or call our office at [phone].    TO LEAVE A MESSAGE FOR THE NURSE SELECT OPTION 2, PLEASE LEAVE A MESSAGE INCLUDING: YOUR NAME DATE OF BIRTH CALL BACK NUMBER REASON FOR CALL**this is important as we prioritize the call backs  YOU WILL RECEIVE A CALL BACK THE SAME DAY AS LONG AS YOU CALL BEFORE 4:00 PM

## 2022-03-18 NOTE — Addendum Note (Signed)
Encounter addended by: Stanford Scotland, RN on: 03/18/2022 12:05 PM  Actions taken: Clinical Note Signed

## 2022-03-19 ENCOUNTER — Encounter: Payer: Self-pay | Admitting: Internal Medicine

## 2022-03-19 ENCOUNTER — Other Ambulatory Visit: Payer: Self-pay

## 2022-03-19 LAB — CULTURE, BLOOD (ROUTINE X 2)
Culture: NO GROWTH
Special Requests: ADEQUATE

## 2022-03-20 LAB — CULTURE, BLOOD (ROUTINE X 2)
Culture: NO GROWTH
Special Requests: ADEQUATE

## 2022-03-28 ENCOUNTER — Other Ambulatory Visit: Payer: Self-pay

## 2022-03-28 ENCOUNTER — Telehealth: Payer: Self-pay | Admitting: Internal Medicine

## 2022-03-28 DIAGNOSIS — D638 Anemia in other chronic diseases classified elsewhere: Secondary | ICD-10-CM

## 2022-03-28 DIAGNOSIS — K760 Fatty (change of) liver, not elsewhere classified: Secondary | ICD-10-CM

## 2022-03-28 DIAGNOSIS — K90829 Short bowel syndrome, unspecified: Secondary | ICD-10-CM

## 2022-03-28 NOTE — Telephone Encounter (Signed)
Inbound call from [facility] requesting a call back in regards to patient needing labs done every week. Please advise, [phone].

## 2022-03-28 NOTE — Telephone Encounter (Signed)
Orders for labs placed for pt. Pt made aware: Pt stated that she will come in the beginning of next week: Pt requested an office visit to check in with Dr. Carlean Purl: Pt was scheduled for an office visit with Dr. Carlean Purl on 12/12 2023 at 1:30: Pt made aware  Pt verbalized understanding with all questions answered.

## 2022-03-31 ENCOUNTER — Telehealth: Payer: Self-pay

## 2022-03-31 ENCOUNTER — Other Ambulatory Visit (INDEPENDENT_AMBULATORY_CARE_PROVIDER_SITE_OTHER): Payer: Medicare Other

## 2022-03-31 DIAGNOSIS — K760 Fatty (change of) liver, not elsewhere classified: Secondary | ICD-10-CM | POA: Diagnosis not present

## 2022-03-31 DIAGNOSIS — I493 Ventricular premature depolarization: Secondary | ICD-10-CM | POA: Diagnosis not present

## 2022-03-31 DIAGNOSIS — K90829 Short bowel syndrome, unspecified: Secondary | ICD-10-CM | POA: Diagnosis not present

## 2022-03-31 DIAGNOSIS — D638 Anemia in other chronic diseases classified elsewhere: Secondary | ICD-10-CM | POA: Diagnosis not present

## 2022-03-31 LAB — CBC WITH DIFFERENTIAL/PLATELET
Basophils Absolute: 0 10*3/uL (ref 0.0–0.1)
Basophils Relative: 0.7 % (ref 0.0–3.0)
Eosinophils Absolute: 0.1 10*3/uL (ref 0.0–0.7)
Eosinophils Relative: 1.9 % (ref 0.0–5.0)
HCT: 23.5 % — CL (ref 36.0–46.0)
Hemoglobin: 7.8 g/dL — CL (ref 12.0–15.0)
Lymphocytes Relative: 12.1 % (ref 12.0–46.0)
Lymphs Abs: 0.7 10*3/uL (ref 0.7–4.0)
MCHC: 33.3 g/dL (ref 30.0–36.0)
MCV: 96.2 fl (ref 78.0–100.0)
Monocytes Absolute: 0.4 10*3/uL (ref 0.1–1.0)
Monocytes Relative: 6.9 % (ref 3.0–12.0)
Neutro Abs: 4.8 10*3/uL (ref 1.4–7.7)
Neutrophils Relative %: 78.4 % — ABNORMAL HIGH (ref 43.0–77.0)
Platelets: 62 10*3/uL — ABNORMAL LOW (ref 150.0–400.0)
RBC: 2.45 Mil/uL — ABNORMAL LOW (ref 3.87–5.11)
RDW: 16.7 % — ABNORMAL HIGH (ref 11.5–15.5)
WBC: 6.2 10*3/uL (ref 4.0–10.5)

## 2022-03-31 LAB — COMPREHENSIVE METABOLIC PANEL
ALT: 15 U/L (ref 0–35)
AST: 15 U/L (ref 0–37)
Albumin: 3 g/dL — ABNORMAL LOW (ref 3.5–5.2)
Alkaline Phosphatase: 146 U/L — ABNORMAL HIGH (ref 39–117)
BUN: 29 mg/dL — ABNORMAL HIGH (ref 6–23)
CO2: 26 mEq/L (ref 19–32)
Calcium: 7.9 mg/dL — ABNORMAL LOW (ref 8.4–10.5)
Chloride: 98 mEq/L (ref 96–112)
Creatinine, Ser: 1.53 mg/dL — ABNORMAL HIGH (ref 0.40–1.20)
GFR: 33.93 mL/min — ABNORMAL LOW (ref >60.00)
Glucose, Bld: 107 mg/dL — ABNORMAL HIGH (ref 70–99)
Potassium: 4.7 mEq/L (ref 3.5–5.1)
Sodium: 130 mEq/L — ABNORMAL LOW (ref 135–145)
Total Bilirubin: 0.6 mg/dL (ref 0.2–1.2)
Total Protein: 7.4 g/dL (ref 6.0–8.3)

## 2022-03-31 LAB — MAGNESIUM: Magnesium: 2.3 mg/dL (ref 1.5–2.5)

## 2022-03-31 LAB — PHOSPHORUS: Phosphorus: 2.9 mg/dL (ref 2.3–4.6)

## 2022-03-31 LAB — TRIGLYCERIDES: Triglycerides: 115 mg/dL (ref 0.0–149.0)

## 2022-03-31 NOTE — Telephone Encounter (Signed)
Received call from lab about a critical value: Hemoglobin 12.0 - 15.0 g/dL 7.8 Repeated and verified X2. Low Panic  7.9 Repeated and verified X2. Low Panic  7.9 Repeated and verified X2. Low Panic       HCT 36.0 - 46.0 % 23.5 Repeated and verified X2.          Please review and advise

## 2022-03-31 NOTE — Telephone Encounter (Signed)
This hemoglobin is stable though it does remain low.  She is seeing her hematologist next week.  Nothing further at this time.

## 2022-04-01 LAB — PREALBUMIN: Prealbumin: 19 mg/dL (ref 17–34)

## 2022-04-06 NOTE — Progress Notes (Signed)
Patient Care Team: [name], MD as PCP - General (Family Medicine) Gatha Mayer, MD as Consulting Physician (Gastroenterology) Nicholas Lose, MD as Consulting Physician (Hematology and Oncology) Madelin Rear, [facility] (Inactive) as Pharmacist (Pharmacist) [name], MD (Family Medicine) Bensimhon, Shaune Pascal, MD as Consulting Physician (Cardiology) Garvin Fila, MD as Consulting Physician (Neurology) Frann Rider, NP as Nurse Practitioner (Neurology)  DIAGNOSIS: No diagnosis found.  SUMMARY OF ONCOLOGIC HISTORY: Oncology History   No history exists.    CHIEF COMPLIANT: Follow-up for thrombocytopenia and anemia    INTERVAL HISTORY: [name] [name] is a 71 y.o. with above-mentioned history of thrombocytopenia and anemia. She presents to the clinic today for follow-up.     ALLERGIES:  is allergic to cefepime and ivp dye [iodinated contrast media].  MEDICATIONS:  Current Outpatient Medications  Medication Sig Dispense Refill   acetaminophen (TYLENOL) 500 MG tablet Take 1,000 mg by mouth every 6 (six) hours as needed for mild pain.     ADULT TPN Inject 1,800 mLs into the vein See admin instructions. Pt receives home TPN from [facility]:  1800 mL bag, five nights weekly (Monday, Tuesday, Wednesday, Thursday and Friday for 8 hours (includes 1 hour taper up and down).     Calcium Carb-Cholecalciferol (CALCIUM + D3 PO) Take 1 tablet by mouth in the morning and at bedtime.     cetirizine (ZYRTEC) 10 MG tablet Take 10 mg by mouth daily as needed for allergies.     clobetasol ointment (TEMOVATE) 6.38 % Apply 1 application  topically 2 (two) times daily as needed (rash).     empagliflozin (JARDIANCE) 10 MG TABS tablet Take 1 tablet (10 mg total) by mouth daily before breakfast. 90 tablet 3   ergocalciferol, VITAMIN D2, (DRISDOL) 200 MCG/ML drops Take 6 mLs (48,000 Units total) by mouth daily. 540 mL 3   furosemide (LASIX) 40 MG tablet Take 1 tablet (40 mg total) by mouth 2  (two) times daily. Morning & early afternoon.     Heparin Sodium, Porcine, (HEPARIN LOCK FLUSH IJ) Inject 5 mLs as directed See admin instructions. 5 ml with TPN infusion on Monday,Tuesday,Wednesday, Thursday, Friday.     losartan (COZAAR) 25 MG tablet Take 0.5 tablets (12.5 mg total) by mouth at bedtime. Please resume only if your kidney function is stable. Your PCP or cardiologist should advice you. 90 tablet 3   omeprazole (PRILOSEC) 40 MG capsule TAKE 1 CAPSULE BY MOUTH EVERY DAY BEFORE BREAKFAST 90 capsule 1   ondansetron (ZOFRAN-ODT) 8 MG disintegrating tablet TAKE 1 TABLET BY MOUTH EVERY 8 HOURS AS NEEDED FOR NAUSEA OR VOMITING. 30 tablet 11   Pediatric Multivit-Minerals (FLINTSTONES GUMMIES PO) Take 4 tablets by mouth in the morning.     potassium chloride SA (KLOR-CON M20) 20 MEQ tablet Take 1 tablet (20 mEq total) by mouth daily. 90 tablet 3   sodium chloride 0.9 % infusion Inject 900 mLs into the vein See admin instructions. 900 ml IV as needed for dehydration on TPN infusion days (Monday, Tuesday, Wednesday, Thursday, Friday)     spironolactone (ALDACTONE) 25 MG tablet TAKE 1 TABLET (25 MG TOTAL) BY MOUTH DAILY. 90 tablet 3   Teduglutide, rDNA, 5 MG KIT Inject 3.31 Units into the skin every morning. 1 kit 0   vitamin E 1000 UNIT capsule Take 1,000 Units by mouth in the morning.     XARELTO 20 MG TABS tablet TAKE 1 TABLET BY MOUTH DAILY WITH SUPPER. 90 tablet 2  No current facility-administered medications for this visit.    PHYSICAL EXAMINATION: ECOG PERFORMANCE STATUS: {CHL ONC ECOG PS:[phone]}  There were no vitals filed for this visit. There were no vitals filed for this visit.  BREAST:*** No palpable masses or nodules in either right or left breasts. No palpable axillary supraclavicular or infraclavicular adenopathy no breast tenderness or nipple discharge. (exam performed in the presence of a chaperone)  LABORATORY DATA:  I have reviewed the data as listed    Latest  Ref Rng & Units 03/31/2022   10:56 AM 03/13/2022   11:41 AM 03/07/2022    1:20 PM  CMP  Glucose 70 - 99 mg/dL 107  97  96   BUN 6 - 23 mg/dL _0 Creatinine 0.40 - 1.20 mg/dL 1.53  1.51  1.66   Sodium 135 - 145 mEq/L 130  134  132   Potassium 3.5 - 5.1 mEq/L 4.7  4.2  4.5   Chloride 96 - 112 mEq/L 98  102  99   CO2 19 - 32 mEq/L _1 Calcium 8.4 - 10.5 mg/dL 7.9  8.4  8.5   Total Protein 6.0 - 8.3 g/dL 7.4  7.5    Total Bilirubin 0.2 - 1.2 mg/dL 0.6  0.6    Alkaline Phos 39 - 117 U/L 146  135    AST 0 - 37 U/L 15  13    ALT 0 - 35 U/L 15  12      Lab Results  Component Value Date   WBC 6.2 03/31/2022   HGB 7.8 Repeated and verified X2. (LL) 03/31/2022   HCT 23.5 Repeated and verified X2. (LL) 03/31/2022   MCV 96.2 03/31/2022   PLT 62.0 (L) 03/31/2022   NEUTROABS 4.8 03/31/2022    ASSESSMENT & PLAN:  No problem-specific Assessment & Plan notes found for this encounter.    No orders of the defined types were placed in this encounter.  The patient has a good understanding of the overall plan. she agrees with it. she will call with any problems that may develop before the next visit here. Total time spent: 30 mins including face to face time and time spent for planning, charting and co-ordination of care   Suzzette Righter, [place] 04/06/22    I Gardiner Coins am scribing for Dr. Lindi Adie  ***

## 2022-04-09 ENCOUNTER — Other Ambulatory Visit: Payer: Self-pay | Admitting: Internal Medicine

## 2022-04-09 ENCOUNTER — Inpatient Hospital Stay: Payer: Medicare Other

## 2022-04-09 ENCOUNTER — Telehealth: Payer: Self-pay | Admitting: Internal Medicine

## 2022-04-09 ENCOUNTER — Inpatient Hospital Stay (HOSPITAL_BASED_OUTPATIENT_CLINIC_OR_DEPARTMENT_OTHER): Payer: Medicare Other | Admitting: Hematology and Oncology

## 2022-04-09 ENCOUNTER — Inpatient Hospital Stay: Payer: Medicare Other | Attending: Hematology and Oncology

## 2022-04-09 ENCOUNTER — Other Ambulatory Visit: Payer: Self-pay

## 2022-04-09 VITALS — BP 115/61 | HR 86 | Temp 97.3°F | Resp 18 | Ht 68.0 in | Wt 145.0 lb

## 2022-04-09 DIAGNOSIS — Z79899 Other long term (current) drug therapy: Secondary | ICD-10-CM | POA: Diagnosis not present

## 2022-04-09 DIAGNOSIS — K90829 Short bowel syndrome, unspecified: Secondary | ICD-10-CM

## 2022-04-09 DIAGNOSIS — N1831 Chronic kidney disease, stage 3a: Secondary | ICD-10-CM | POA: Insufficient documentation

## 2022-04-09 DIAGNOSIS — D638 Anemia in other chronic diseases classified elsewhere: Secondary | ICD-10-CM | POA: Diagnosis not present

## 2022-04-09 DIAGNOSIS — D696 Thrombocytopenia, unspecified: Secondary | ICD-10-CM | POA: Diagnosis not present

## 2022-04-09 DIAGNOSIS — R6883 Chills (without fever): Secondary | ICD-10-CM

## 2022-04-09 DIAGNOSIS — D631 Anemia in chronic kidney disease: Secondary | ICD-10-CM | POA: Insufficient documentation

## 2022-04-09 DIAGNOSIS — D539 Nutritional anemia, unspecified: Secondary | ICD-10-CM

## 2022-04-09 DIAGNOSIS — K9089 Other intestinal malabsorption: Secondary | ICD-10-CM | POA: Diagnosis not present

## 2022-04-09 DIAGNOSIS — K912 Postsurgical malabsorption, not elsewhere classified: Secondary | ICD-10-CM | POA: Diagnosis not present

## 2022-04-09 DIAGNOSIS — E871 Hypo-osmolality and hyponatremia: Secondary | ICD-10-CM

## 2022-04-09 LAB — CBC WITH DIFFERENTIAL (CANCER CENTER ONLY)
Abs Immature Granulocytes: 0.02 10*3/uL (ref 0.00–0.07)
Basophils Absolute: 0 10*3/uL (ref 0.0–0.1)
Basophils Relative: 1 %
Eosinophils Absolute: 0.2 10*3/uL (ref 0.0–0.5)
Eosinophils Relative: 2 %
HCT: 24.1 % — ABNORMAL LOW (ref 36.0–46.0)
Hemoglobin: 7.7 g/dL — ABNORMAL LOW (ref 12.0–15.0)
Immature Granulocytes: 0 %
Lymphocytes Relative: 17 %
Lymphs Abs: 1.2 10*3/uL (ref 0.7–4.0)
MCH: 31.7 pg (ref 26.0–34.0)
MCHC: 32 g/dL (ref 30.0–36.0)
MCV: 99.2 fL (ref 80.0–100.0)
Monocytes Absolute: 0.6 10*3/uL (ref 0.1–1.0)
Monocytes Relative: 9 %
Neutro Abs: 4.7 10*3/uL (ref 1.7–7.7)
Neutrophils Relative %: 71 %
Platelet Count: 61 10*3/uL — ABNORMAL LOW (ref 150–400)
RBC: 2.43 MIL/uL — ABNORMAL LOW (ref 3.87–5.11)
RDW: 15.5 % (ref 11.5–15.5)
WBC Count: 6.7 10*3/uL (ref 4.0–10.5)
nRBC: 0 % (ref 0.0–0.2)

## 2022-04-09 LAB — PREALBUMIN: Prealbumin: 15 mg/dL — ABNORMAL LOW (ref 18–38)

## 2022-04-09 LAB — CMP (CANCER CENTER ONLY)
ALT: 10 U/L (ref 0–44)
AST: 15 U/L (ref 15–41)
Albumin: 3.2 g/dL — ABNORMAL LOW (ref 3.5–5.0)
Alkaline Phosphatase: 151 U/L — ABNORMAL HIGH (ref 38–126)
Anion gap: 8 (ref 5–15)
BUN: 27 mg/dL — ABNORMAL HIGH (ref 8–23)
CO2: 18 mmol/L — ABNORMAL LOW (ref 22–32)
Calcium: 8.5 mg/dL — ABNORMAL LOW (ref 8.9–10.3)
Chloride: 99 mmol/L (ref 98–111)
Creatinine: 2.09 mg/dL — ABNORMAL HIGH (ref 0.44–1.00)
GFR, Estimated: 25 mL/min — ABNORMAL LOW (ref >60)
Glucose, Bld: 105 mg/dL — ABNORMAL HIGH (ref 70–99)
Potassium: 4.6 mmol/L (ref 3.5–5.1)
Sodium: 125 mmol/L — ABNORMAL LOW (ref 135–145)
Total Bilirubin: 0.6 mg/dL (ref 0.3–1.2)
Total Protein: 8 g/dL (ref 6.5–8.1)

## 2022-04-09 LAB — PHOSPHORUS: Phosphorus: 3.9 mg/dL (ref 2.5–4.6)

## 2022-04-09 LAB — MAGNESIUM: Magnesium: 2.1 mg/dL (ref 1.7–2.4)

## 2022-04-09 LAB — TRIGLYCERIDES: Triglycerides: 184 mg/dL — ABNORMAL HIGH (ref <150)

## 2022-04-09 MED ORDER — DARBEPOETIN ALFA 300 MCG/0.6ML IJ SOSY
300.0000 ug | PREFILLED_SYRINGE | Freq: Once | INTRAMUSCULAR | Status: AC
  Administered 2022-04-09: 300 ug via SUBCUTANEOUS
  Filled 2022-04-09: qty 0.6

## 2022-04-09 NOTE — Telephone Encounter (Signed)
Home infusion rep called Elana requesting to speak with you regarding critical lab results [phone].

## 2022-04-09 NOTE — Telephone Encounter (Signed)
Demaria has a sodium of 125 and a creatinine up to 2.  Discussed with Jiles Garter of [place], TPN coordinator.  Spoke to Shaunda.  She feels okay no symptoms of hyponatremia.   Advised that hospitalization most likely the best way to handle this.  She declined going to the ER.  She said she did not infuse her TPN Foley last night as she had chills with infusion.  Has not had fevers or other symptoms like that since.     Plan:  Hold furosemide  Limit free water as best as possible  Recheck bmet in a.m.  Should she have chills again with TPN infusion, significant dyspnea, confusion weakness etc. present to ED instead.

## 2022-04-09 NOTE — Assessment & Plan Note (Addendum)
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 She received last Aranesp injection February 2016. She had required a total of 3 Aranesp injections in 2015, while she received at least 10 injections in 2014. So we discontinued therapy.   Current treatment:  Reinitiated Aranesp 10/26/2020.  Also received July 2022.  Held August 2022 (hemoglobin 10.7), resumed at 04/09/2022   Short bowel syndrome on Gattex, Teduglutide she is currently on TPN 3 nights a week She had surgery on her small intestine for blood clots in 2006 and since then she has short gut syndrome. This is the reason for her chronic disease.   Patient's baseline platelet count is around 100 09/17/2020: Hemoglobin 9.7, platelets 92 10/10/2020: Hemoglobin 8.5, platelets 52 09/10/2021: Hemoglobin 8.9, platelets 80, creatinine 1.17, albumin 3.1, iron saturation 9%, ferritin 72 03/31/2022: Hemoglobin 7.8, platelets 62    She comes every 3 weeks for Aranesp injections We will also set her up for IV iron over the next 2 weeks.

## 2022-04-09 NOTE — Telephone Encounter (Signed)
Elana RN stated that she was reaching out  in regard to pt recent sodium being low: Elana RN stated that she contacted Dr. Carlean Purl and he ordered for the pt to have labs in the AM.

## 2022-04-10 ENCOUNTER — Other Ambulatory Visit: Payer: Self-pay

## 2022-04-10 ENCOUNTER — Other Ambulatory Visit (INDEPENDENT_AMBULATORY_CARE_PROVIDER_SITE_OTHER): Payer: Medicare Other

## 2022-04-10 DIAGNOSIS — K90829 Short bowel syndrome, unspecified: Secondary | ICD-10-CM

## 2022-04-10 DIAGNOSIS — D638 Anemia in other chronic diseases classified elsewhere: Secondary | ICD-10-CM

## 2022-04-10 DIAGNOSIS — E871 Hypo-osmolality and hyponatremia: Secondary | ICD-10-CM | POA: Diagnosis not present

## 2022-04-10 DIAGNOSIS — K760 Fatty (change of) liver, not elsewhere classified: Secondary | ICD-10-CM

## 2022-04-10 LAB — BASIC METABOLIC PANEL
BUN: 30 mg/dL — ABNORMAL HIGH (ref 6–23)
CO2: 23 mEq/L (ref 19–32)
Calcium: 8.1 mg/dL — ABNORMAL LOW (ref 8.4–10.5)
Chloride: 103 mEq/L (ref 96–112)
Creatinine, Ser: 1.92 mg/dL — ABNORMAL HIGH (ref 0.40–1.20)
GFR: 25.83 mL/min — ABNORMAL LOW (ref >60.00)
Glucose, Bld: 108 mg/dL — ABNORMAL HIGH (ref 70–99)
Potassium: 4.4 mEq/L (ref 3.5–5.1)
Sodium: 133 mEq/L — ABNORMAL LOW (ref 135–145)

## 2022-04-10 NOTE — Telephone Encounter (Signed)
[name] [name] called to let yo know she received labs today and sent over a new order.

## 2022-04-11 NOTE — Telephone Encounter (Signed)
Orders signed and faxed back over to [place] RN:  Pt updated and notified of Jiles Garter request  to have labs drawn on 12/6 or 12/7 so that she can get a few days of the formula infused:  Pt verbalized understanding with all questions answered.

## 2022-04-13 ENCOUNTER — Encounter (HOSPITAL_BASED_OUTPATIENT_CLINIC_OR_DEPARTMENT_OTHER): Payer: Self-pay | Admitting: Emergency Medicine

## 2022-04-13 ENCOUNTER — Other Ambulatory Visit: Payer: Self-pay

## 2022-04-13 ENCOUNTER — Emergency Department (HOSPITAL_COMMUNITY): Payer: Medicare Other

## 2022-04-13 DIAGNOSIS — I213 ST elevation (STEMI) myocardial infarction of unspecified site: Secondary | ICD-10-CM | POA: Diagnosis not present

## 2022-04-13 DIAGNOSIS — Z833 Family history of diabetes mellitus: Secondary | ICD-10-CM

## 2022-04-13 DIAGNOSIS — I509 Heart failure, unspecified: Secondary | ICD-10-CM | POA: Diagnosis not present

## 2022-04-13 DIAGNOSIS — I447 Left bundle-branch block, unspecified: Secondary | ICD-10-CM | POA: Diagnosis present

## 2022-04-13 DIAGNOSIS — I340 Nonrheumatic mitral (valve) insufficiency: Secondary | ICD-10-CM | POA: Diagnosis not present

## 2022-04-13 DIAGNOSIS — K55069 Acute infarction of intestine, part and extent unspecified: Secondary | ICD-10-CM | POA: Diagnosis present

## 2022-04-13 DIAGNOSIS — I472 Ventricular tachycardia, unspecified: Secondary | ICD-10-CM | POA: Diagnosis not present

## 2022-04-13 DIAGNOSIS — I428 Other cardiomyopathies: Secondary | ICD-10-CM | POA: Diagnosis present

## 2022-04-13 DIAGNOSIS — Z87891 Personal history of nicotine dependence: Secondary | ICD-10-CM

## 2022-04-13 DIAGNOSIS — E872 Acidosis, unspecified: Secondary | ICD-10-CM | POA: Diagnosis present

## 2022-04-13 DIAGNOSIS — D509 Iron deficiency anemia, unspecified: Secondary | ICD-10-CM | POA: Diagnosis present

## 2022-04-13 DIAGNOSIS — K90829 Short bowel syndrome, unspecified: Secondary | ICD-10-CM | POA: Diagnosis present

## 2022-04-13 DIAGNOSIS — G9340 Encephalopathy, unspecified: Secondary | ICD-10-CM | POA: Diagnosis not present

## 2022-04-13 DIAGNOSIS — T68XXXA Hypothermia, initial encounter: Secondary | ICD-10-CM | POA: Diagnosis not present

## 2022-04-13 DIAGNOSIS — N170 Acute kidney failure with tubular necrosis: Secondary | ICD-10-CM | POA: Diagnosis present

## 2022-04-13 DIAGNOSIS — R079 Chest pain, unspecified: Secondary | ICD-10-CM | POA: Diagnosis not present

## 2022-04-13 DIAGNOSIS — D631 Anemia in chronic kidney disease: Secondary | ICD-10-CM | POA: Diagnosis present

## 2022-04-13 DIAGNOSIS — D6859 Other primary thrombophilia: Secondary | ICD-10-CM | POA: Diagnosis present

## 2022-04-13 DIAGNOSIS — K2210 Ulcer of esophagus without bleeding: Secondary | ICD-10-CM | POA: Diagnosis present

## 2022-04-13 DIAGNOSIS — Z79899 Other long term (current) drug therapy: Secondary | ICD-10-CM

## 2022-04-13 DIAGNOSIS — Z91041 Radiographic dye allergy status: Secondary | ICD-10-CM

## 2022-04-13 DIAGNOSIS — K760 Fatty (change of) liver, not elsewhere classified: Secondary | ICD-10-CM | POA: Diagnosis present

## 2022-04-13 DIAGNOSIS — N1832 Chronic kidney disease, stage 3b: Secondary | ICD-10-CM | POA: Diagnosis present

## 2022-04-13 DIAGNOSIS — Z8249 Family history of ischemic heart disease and other diseases of the circulatory system: Secondary | ICD-10-CM

## 2022-04-13 DIAGNOSIS — I69354 Hemiplegia and hemiparesis following cerebral infarction affecting left non-dominant side: Secondary | ICD-10-CM | POA: Diagnosis not present

## 2022-04-13 DIAGNOSIS — Z7985 Long-term (current) use of injectable non-insulin antidiabetic drugs: Secondary | ICD-10-CM

## 2022-04-13 DIAGNOSIS — I080 Rheumatic disorders of both mitral and aortic valves: Secondary | ICD-10-CM | POA: Diagnosis present

## 2022-04-13 DIAGNOSIS — I214 Non-ST elevation (NSTEMI) myocardial infarction: Secondary | ICD-10-CM

## 2022-04-13 DIAGNOSIS — J9601 Acute respiratory failure with hypoxia: Secondary | ICD-10-CM | POA: Diagnosis not present

## 2022-04-13 DIAGNOSIS — R0602 Shortness of breath: Secondary | ICD-10-CM | POA: Diagnosis not present

## 2022-04-13 DIAGNOSIS — D638 Anemia in other chronic diseases classified elsewhere: Secondary | ICD-10-CM | POA: Diagnosis present

## 2022-04-13 DIAGNOSIS — R001 Bradycardia, unspecified: Secondary | ICD-10-CM | POA: Diagnosis present

## 2022-04-13 DIAGNOSIS — R570 Cardiogenic shock: Secondary | ICD-10-CM | POA: Diagnosis not present

## 2022-04-13 DIAGNOSIS — Z1152 Encounter for screening for COVID-19: Secondary | ICD-10-CM

## 2022-04-13 DIAGNOSIS — E1122 Type 2 diabetes mellitus with diabetic chronic kidney disease: Secondary | ICD-10-CM | POA: Diagnosis present

## 2022-04-13 DIAGNOSIS — Z66 Do not resuscitate: Secondary | ICD-10-CM | POA: Diagnosis present

## 2022-04-13 DIAGNOSIS — D72829 Elevated white blood cell count, unspecified: Secondary | ICD-10-CM | POA: Diagnosis present

## 2022-04-13 DIAGNOSIS — I5020 Unspecified systolic (congestive) heart failure: Secondary | ICD-10-CM | POA: Diagnosis not present

## 2022-04-13 DIAGNOSIS — E871 Hypo-osmolality and hyponatremia: Secondary | ICD-10-CM | POA: Diagnosis present

## 2022-04-13 DIAGNOSIS — Z7984 Long term (current) use of oral hypoglycemic drugs: Secondary | ICD-10-CM

## 2022-04-13 DIAGNOSIS — I5023 Acute on chronic systolic (congestive) heart failure: Secondary | ICD-10-CM | POA: Diagnosis present

## 2022-04-13 DIAGNOSIS — D696 Thrombocytopenia, unspecified: Secondary | ICD-10-CM | POA: Diagnosis present

## 2022-04-13 DIAGNOSIS — R0789 Other chest pain: Secondary | ICD-10-CM | POA: Diagnosis not present

## 2022-04-13 DIAGNOSIS — Z7901 Long term (current) use of anticoagulants: Secondary | ICD-10-CM

## 2022-04-13 DIAGNOSIS — Z888 Allergy status to other drugs, medicaments and biological substances status: Secondary | ICD-10-CM

## 2022-04-13 DIAGNOSIS — I25110 Atherosclerotic heart disease of native coronary artery with unstable angina pectoris: Secondary | ICD-10-CM | POA: Diagnosis present

## 2022-04-13 DIAGNOSIS — E1151 Type 2 diabetes mellitus with diabetic peripheral angiopathy without gangrene: Secondary | ICD-10-CM | POA: Diagnosis present

## 2022-04-13 DIAGNOSIS — E1165 Type 2 diabetes mellitus with hyperglycemia: Secondary | ICD-10-CM | POA: Diagnosis present

## 2022-04-13 DIAGNOSIS — I82C21 Chronic embolism and thrombosis of right internal jugular vein: Secondary | ICD-10-CM | POA: Diagnosis present

## 2022-04-13 DIAGNOSIS — N179 Acute kidney failure, unspecified: Secondary | ICD-10-CM | POA: Diagnosis present

## 2022-04-13 LAB — CBC WITH DIFFERENTIAL/PLATELET
Abs Immature Granulocytes: 0.04 10*3/uL (ref 0.00–0.07)
Basophils Absolute: 0.1 10*3/uL (ref 0.0–0.1)
Basophils Relative: 1 %
Eosinophils Absolute: 0.2 10*3/uL (ref 0.0–0.5)
Eosinophils Relative: 2 %
HCT: 29.5 % — ABNORMAL LOW (ref 36.0–46.0)
Hemoglobin: 9.2 g/dL — ABNORMAL LOW (ref 12.0–15.0)
Immature Granulocytes: 0 %
Lymphocytes Relative: 12 %
Lymphs Abs: 1.4 10*3/uL (ref 0.7–4.0)
MCH: 32.4 pg (ref 26.0–34.0)
MCHC: 31.2 g/dL (ref 30.0–36.0)
MCV: 103.9 fL — ABNORMAL HIGH (ref 80.0–100.0)
Monocytes Absolute: 0.8 10*3/uL (ref 0.1–1.0)
Monocytes Relative: 7 %
Neutro Abs: 8.7 10*3/uL — ABNORMAL HIGH (ref 1.7–7.7)
Neutrophils Relative %: 78 %
Platelets: 103 10*3/uL — ABNORMAL LOW (ref 150–400)
RBC: 2.84 MIL/uL — ABNORMAL LOW (ref 3.87–5.11)
RDW: 17.4 % — ABNORMAL HIGH (ref 11.5–15.5)
WBC: 11.1 10*3/uL — ABNORMAL HIGH (ref 4.0–10.5)
nRBC: 0.2 % (ref 0.0–0.2)

## 2022-04-13 LAB — RETICULOCYTES
Immature Retic Fract: 24.4 % — ABNORMAL HIGH (ref 2.3–15.9)
RBC.: 2.82 MIL/uL — ABNORMAL LOW (ref 3.87–5.11)
Retic Count, Absolute: 128.9 10*3/uL (ref 19.0–186.0)
Retic Ct Pct: 4.6 % — ABNORMAL HIGH (ref 0.4–3.1)

## 2022-04-13 MED ORDER — SODIUM CHLORIDE 0.9 % IV BOLUS
1000.0000 mL | Freq: Once | INTRAVENOUS | Status: AC
  Administered 2022-04-13: 1000 mL via INTRAVENOUS

## 2022-04-13 MED ORDER — HEPARIN (PORCINE) 25000 UT/250ML-% IV SOLN: 800.0000 [IU]/h | INTRAVENOUS | Status: DC

## 2022-04-13 MED ORDER — ASPIRIN 81 MG PO CHEW
162.0000 mg | CHEWABLE_TABLET | Freq: Once | ORAL | Status: AC
  Administered 2022-04-13: 162 mg via ORAL

## 2022-04-13 MED ORDER — ASPIRIN 81 MG PO CHEW
CHEWABLE_TABLET | ORAL | Status: AC
  Filled 2022-04-13: qty 2

## 2022-04-13 MED ORDER — FENTANYL CITRATE PF 50 MCG/ML IJ SOSY
50.0000 ug | PREFILLED_SYRINGE | Freq: Once | INTRAMUSCULAR | Status: AC
  Administered 2022-04-13: 50 ug via INTRAVENOUS
  Filled 2022-04-13: qty 1

## 2022-04-13 NOTE — ED Provider Notes (Signed)
10:58 PM Patient arrives from [facility].  Initially activated as code STEMI but cancelled by cardiology and transferred here for further evaluation.  She has not had any labs or imaging done thus far.  Reports overall she just feels poor.  She does report anemia and was told recently her blood counts are low, due for Fe+ infusion tomorrow with heme/onc.  She also reports chills recently during TPN infusion but no fever.  No sick contacts.  Will check labs, type and screen, CXR, RVP.  Currently on 2L and maintaining at 100%, does not generally require O2.  Chest pain more of a discomfort now, much improved from prior.  She did have 1L IVFB and 324 ASA prior to transport.    Results for orders placed or performed during the hospital encounter of 05/11/2022  CBC with Differential  Result Value Ref Range   WBC 11.1 (H) 4.0 - 10.5 K/uL   RBC 2.84 (L) 3.87 - 5.11 MIL/uL   Hemoglobin 9.2 (L) 12.0 - 15.0 g/dL   HCT 29.5 (L) 36.0 - 46.0 %   MCV 103.9 (H) 80.0 - 100.0 fL   MCH 32.4 26.0 - 34.0 pg   MCHC 31.2 30.0 - 36.0 g/dL   RDW 17.4 (H) 11.5 - 15.5 %   Platelets 103 (L) 150 - 400 K/uL   nRBC 0.2 0.0 - 0.2 %   Neutrophils Relative % 78 %   Neutro Abs 8.7 (H) 1.7 - 7.7 K/uL   Lymphocytes Relative 12 %   Lymphs Abs 1.4 0.7 - 4.0 K/uL   Monocytes Relative 7 %   Monocytes Absolute 0.8 0.1 - 1.0 K/uL   Eosinophils Relative 2 %   Eosinophils Absolute 0.2 0.0 - 0.5 K/uL   Basophils Relative 1 %   Basophils Absolute 0.1 0.0 - 0.1 K/uL   Immature Granulocytes 0 %   Abs Immature Granulocytes 0.04 0.00 - 0.07 K/uL  Brain natriuretic peptide  Result Value Ref Range   B Natriuretic Peptide 2,920.7 (H) 0.0 - 100.0 pg/mL  Reticulocytes  Result Value Ref Range   Retic Ct Pct 4.6 (H) 0.4 - 3.1 %   RBC. 2.82 (L) 3.87 - 5.11 MIL/uL   Retic Count, Absolute 128.9 19.0 - 186.0 K/uL   Immature Retic Fract 24.4 (H) 2.3 - 15.9 %   *Note: Due to a large number of results and/or encounters for the requested time  period, some results have not been displayed. A complete set of results can be found in Results Review.   DG Chest Port 1 View  Result Date: 04/29/2022 CLINICAL DATA:  Chest pain. EXAM: PORTABLE CHEST 1 VIEW COMPARISON:  Chest radiograph dated 02/11/2022. FINDINGS: Similar positioning of the left-sided catheter with tip close to the cavoatrial junction. There is diffuse interstitial coarsening and nodularity, new since the prior radiograph and concerning for edema or pneumonia. No consolidative changes. Trace pleural effusions may be present. No pneumothorax. Mild cardiomegaly. SVC stent. No acute osseous pathology. IMPRESSION: Diffuse interstitial coarsening and nodularity concerning for edema or pneumonia. Electronically Signed   By: Anner Crete M.D.   On: 04/20/2022 23:25    Labs as above-- BNP 2900+.  Troponin 241.  CXR with edema vs pneumonia.  I favor edema. Renal function appears at baseline.  She is on xarelto, do not feel she needs heparin at this time.  Pressures remain soft in the 90's-- states baseline is in the 80's.  Do have concern for decompensated heart failure, pressure likely to drop after  additional IV diuresis.  Will discuss with cardiology, likely medical admission.  12:37 AM Spoke with cardiology fellow, Dr. Alfred Levins-- will consult but she will need medical admission given her other co morbidities.  Discussed with Dr. Nevada Crane-- requested VBG which was sent, pH is normal.  She will admit for ongoing care.  CRITICAL CARE Performed by: Larene Pickett   Total critical care time: 45 minutes  Critical care time was exclusive of separately billable procedures and treating other patients.  Critical care was necessary to treat or prevent imminent or life-threatening deterioration.  Critical care was time spent personally by me on the following activities: development of treatment plan with patient and/or surrogate as well as nursing, discussions with consultants, evaluation of  patient's response to treatment, examination of patient, obtaining history from patient or surrogate, ordering and performing treatments and interventions, ordering and review of laboratory studies, ordering and review of radiographic studies, pulse oximetry and re-evaluation of patient's condition.    Larene Pickett, PA-C 2022/04/19 0129    Margette Fast, MD 04/17/22 [phone]

## 2022-04-13 NOTE — ED Provider Notes (Signed)
[place] EMERGENCY DEPARTMENT Provider Note   CSN: [id number] Arrival date & time: 04/28/2022  2135     History  Chief Complaint  Patient presents with   Chest Pain    [name] [name] is a 71 y.o. female.  71 yo F with a chief concern of chest pain.  This was sudden onset that occurred about an hour ago while at rest.  Substernal crushing radiates to the jaw.  Shortness of breath with it.  She tells me that she does not feel well.  No diaphoresis nausea or vomiting.  The patient has had trouble with her mitral valve in the past but denies obvious coronary artery disease.     Chest Pain      Home Medications Prior to Admission medications   Medication Sig Start Date End Date Taking? Authorizing Provider  acetaminophen (TYLENOL) 500 MG tablet Take 1,000 mg by mouth every 6 (six) hours as needed for mild pain.    [provider]  ADULT TPN Inject 1,800 mLs into the vein See admin instructions. Pt receives home TPN from [facility]:  1800 mL bag, five nights weekly (Monday, Tuesday, Wednesday, Thursday and Friday for 8 hours (includes 1 hour taper up and down).    [provider]  Calcium Carb-Cholecalciferol (CALCIUM + D3 PO) Take 1 tablet by mouth in the morning and at bedtime.    [provider]  cetirizine (ZYRTEC) 10 MG tablet Take 10 mg by mouth daily as needed for allergies.    [provider]  clobetasol ointment (TEMOVATE) 2.45 % Apply 1 application  topically 2 (two) times daily as needed (rash). 06/14/21   [provider]  empagliflozin (JARDIANCE) 10 MG TABS tablet Take 1 tablet (10 mg total) by mouth daily before breakfast. 03/03/22   Bensimhon, Shaune Pascal, MD  ergocalciferol, VITAMIN D2, (DRISDOL) 200 MCG/ML drops Take 6 mLs (48,000 Units total) by mouth daily. 03/06/22   Gatha Mayer, MD  furosemide (LASIX) 40 MG tablet Take 1 tablet (40 mg total) by mouth 2 (two) times daily. Morning & early afternoon. 02/06/22    Bonnielee Haff, MD  Heparin Sodium, Porcine, (HEPARIN LOCK FLUSH IJ) Inject 5 mLs as directed See admin instructions. 5 ml with TPN infusion on Monday,Tuesday,Wednesday, Thursday, Friday.    [provider]  losartan (COZAAR) 25 MG tablet Take 0.5 tablets (12.5 mg total) by mouth at bedtime. Please resume only if your kidney function is stable. Your PCP or cardiologist should advice you. 02/12/22   Bonnielee Haff, MD  omeprazole (PRILOSEC) 40 MG capsule TAKE 1 CAPSULE BY MOUTH EVERY DAY BEFORE BREAKFAST 12/27/21   Gatha Mayer, MD  ondansetron (ZOFRAN-ODT) 8 MG disintegrating tablet TAKE 1 TABLET BY MOUTH EVERY 8 HOURS AS NEEDED FOR NAUSEA OR VOMITING. 09/09/21   Gatha Mayer, MD  Pediatric Multivit-Minerals (FLINTSTONES GUMMIES PO) Take 4 tablets by mouth in the morning.    [provider]  potassium chloride SA (KLOR-CON M20) 20 MEQ tablet Take 1 tablet (20 mEq total) by mouth daily. 12/16/21   Bensimhon, Shaune Pascal, MD  sodium chloride 0.9 % infusion Inject 900 mLs into the vein See admin instructions. 900 ml IV as needed for dehydration on TPN infusion days (Monday, Tuesday, Wednesday, Thursday, Friday) 10/27/17   [provider]  spironolactone (ALDACTONE) 25 MG tablet TAKE 1 TABLET (25 MG TOTAL) BY MOUTH DAILY. 08/28/21   Bensimhon, Shaune Pascal, MD  Teduglutide, rDNA, 5 MG KIT Inject 3.31 Units  into the skin every morning. 10/17/21   Gatha Mayer, MD  vitamin E 1000 UNIT capsule Take 1,000 Units by mouth in the morning.    [provider]  XARELTO 20 MG TABS tablet TAKE 1 TABLET BY MOUTH DAILY WITH SUPPER. 08/30/21   [name], MD      Allergies    Cefepime and Ivp dye [iodinated contrast media]    Review of Systems   Review of Systems  Cardiovascular:  Positive for chest pain.    Physical Exam Updated Vital Signs BP (!) 81/63   Pulse 83   Temp 98 F (36.7 C)   Resp (!) 22   Ht _0  (1.727 m)   Wt 63.5 kg   LMP 02/26/2014   SpO2 99%    BMI 21.29 kg/m  Physical Exam Vitals and nursing note reviewed.  Constitutional:      General: She is not in acute distress.    Appearance: She is well-developed. She is not diaphoretic.     Comments: pale  HENT:     Head: Normocephalic and atraumatic.  Eyes:     Pupils: Pupils are equal, round, and reactive to light.  Cardiovascular:     Rate and Rhythm: Normal rate and regular rhythm.     Heart sounds: No murmur heard.    No friction rub. No gallop.  Pulmonary:     Effort: Pulmonary effort is normal.     Breath sounds: No wheezing or rales.  Chest:     Comments: Left-sided port.  No obvious tenderness or erythema about the site. Abdominal:     General: There is no distension.     Palpations: Abdomen is soft.     Tenderness: There is no abdominal tenderness.  Musculoskeletal:        General: No tenderness.     Cervical back: Normal range of motion and neck supple.  Skin:    General: Skin is warm and dry.  Neurological:     Mental Status: She is alert and oriented to person, place, and time.  Psychiatric:        Behavior: Behavior normal.     ED Results / Procedures / Treatments   Labs (all labs ordered are listed, but only abnormal results are displayed) Labs Reviewed - No data to display  EKG EKG Interpretation  Date/Time:  Sunday April 13 2022 21:42:31 EST Ventricular Rate:  78 PR Interval:  214 QRS Duration: 130 QT Interval:  436 QTC Calculation: 497 R Axis:   -14 Text Interpretation: Sinus rhythm with sinus arrhythmia with 1st degree A-V block Non-specific intra-ventricular conduction block No significant change since last tracing Confirmed by Deno Etienne [phone]) on 04/20/2022 9:45:18 PM  Radiology No results found.  Procedures .Critical Care  Performed by: Deno Etienne, DO Authorized by: Deno Etienne, DO   Critical care provider statement:    Critical care time (minutes):  35   Critical care time was exclusive of:  Separately billable procedures and  treating other patients   Critical care was time spent personally by me on the following activities:  Development of treatment plan with patient or surrogate, discussions with consultants, evaluation of patient's response to treatment, examination of patient, ordering and review of laboratory studies, ordering and review of radiographic studies, ordering and performing treatments and interventions, pulse oximetry, re-evaluation of patient's condition and review of old charts   Care discussed with: admitting provider       Medications Ordered in ED Medications  aspirin chewable tablet 162 mg (162 mg Oral Given 05/06/2022 2154)  sodium chloride 0.9 % bolus 1,000 mL (1,000 mLs Intravenous New Bag/Given 04/25/2022 2159)    ED Course/ Medical Decision Making/ A&P                           Medical Decision Making Risk OTC drugs.   71 yo F with a chief complaints of chest pain.  This started abruptly about an hour ago.  Feels like a pressure across the chest radiates into the jaw.  Feels like she is having difficulty breathing with it.  Has had a history of mitral valve disease.  Needs a valve replacements but on able to do so due to other illnesses.  She gets TPN.  No fevers.  Has had a mild cough.  EKG with machine read as likely inferior STEMI.  Seems to have somewhat similar morphology to prior there with patient's acute onset to and concerning symptoms when had an activated code STEMI protocol.  I discussed the case with Dr. Claiborne Billings, interventional cardiology he independently reviewed the patient's EKG and prior.  Based on the EKG he felt less likely to be acute ST elevation MI.  Will hold off on activating the Cath Lab at this time.  Discussed ED to ED transfer to have her acutely evaluated by cardiology in person.  I discussed the case with Dr. Laverta Baltimore, agrees to ED to ED transfer to [facility].  Start on heparin.   Patient's blood pressure on the softer side.  Will give a gentle bolus of IV  fluids.   The patients results and plan were reviewed and discussed.   Any x-rays performed were independently reviewed by myself.   Differential diagnosis were considered with the presenting HPI.  Medications  aspirin chewable tablet 162 mg (162 mg Oral Given 05/02/2022 2154)  sodium chloride 0.9 % bolus 1,000 mL (1,000 mLs Intravenous New Bag/Given 04/18/2022 2159)    Vitals:   05/06/2022 2143 04/26/2022 2144 04/15/2022 2150 04/11/2022 2200  BP:  91/64 (!) 85/63 (!) 81/63  Pulse:  83 93 83  Resp:  (!) 22 19 (!) 22  Temp:  98 F (36.7 C)    SpO2:  96% 92% 99%  Weight: 63.5 kg     Height: _0  (1.727 m)       Final diagnoses:  Chest pain with high risk for cardiac etiology    Admission/ observation were discussed with the admitting physician, patient and/or family and they are comfortable with the plan.           Final Clinical Impression(s) / ED Diagnoses Final diagnoses:  Chest pain with high risk for cardiac etiology    Rx / DC Orders ED Discharge Orders     None         Deno Etienne, DO 04/28/2022 2211

## 2022-04-13 NOTE — ED Triage Notes (Signed)
Patient reports left jaw pain, right arm pain, and left sided chest pain, sudden onset for the last hour. Patient also c/o shortness of breath and nausea. Patient states she has a mitral valve problem and history of bradycardia.

## 2022-04-13 NOTE — Progress Notes (Signed)
ANTICOAGULATION CONSULT NOTE - Initial Consult  Pharmacy Consult for heparin Indication: chest pain/ACS  Allergies  Allergen Reactions   Cefepime Other (See Comments)    Severe encephalopathy   Ivp Dye [Iodinated Contrast Media] Itching    Can use if taking benadryl     Patient Measurements: Height: '5\' 8"'$  (172.7 cm) Weight: 63.5 kg (140 lb) IBW/kg (Calculated) : 63.9 Heparin Dosing Weight: TBW  Vital Signs: Temp: 98 F (36.7 C) (12/03 2144) BP: 81/63 (12/03 2200) Pulse Rate: 83 (12/03 2200)  Labs: No results for input(s): "HGB", "HCT", "PLT", "APTT", "LABPROT", "INR", "HEPARINUNFRC", "HEPRLOWMOCWT", "CREATININE", "CKTOTAL", "CKMB", "TROPONINIHS" in the last 72 hours.  Estimated Creatinine Clearance: 26.9 mL/min (A) (by C-G formula based on SCr of 1.92 mg/dL (H)).   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   Carotid stenosis    Carotid US (9/15):  R 40-59%; L 1-39% >> FU 1 year   Cataract    Closed left subtrochanteric femur fracture ([place]) 12/31/2017   Clotting disorder ([place])    Congestive heart failure (CHF) (HCC)    EF 25-30%   COVID-19 2022   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   Fever 10/29/2020   Fracture of left clavicle    GERD (gastroesophageal reflux disease)    History of blood transfusion 2013   anemia   Hx of cardiovascular stress test    Myoview (9/15):  inf-apical scar; no ischemia; EF 47% - low risk    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 12/12/2011   Osteoporosis    Pancytopenia 10/07/2011   Pathologic fracture of neck of femur ([place])    Peripheral vascular disease  ([place])    Personal history of colonic polyps    Renal insufficiency    hx of yrs ago   Serratia marcescens infection - bactermia assoc w/ PICC 01/18/2015   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  Hx of hypercoagulable disorder on Xarelto PTA with last dose 12/3   Goal of Therapy:  Heparin level 0.3-0.7 units/ml aPTT 66-102 seconds Monitor platelets by anticoagulation protocol: Yes   Plan:  Heparin gtt at 800 units/hr, no bolus F/u 8 hour aPTT/HL F/u cards eval and recs  Bertis Ruddy, PharmD, [place] Pharmacist ED Pharmacist Phone # [phone] 05/07/2022 10:09 PM

## 2022-04-13 NOTE — ED Notes (Signed)
Pt arrived from [facility]  Pt states chest pressure and jaw pain have improved to a 4/10 from a 10/10   Pt states she has hx of lower BP's and bradycardia  Pt is alert and oriented on arrival

## 2022-04-14 ENCOUNTER — Other Ambulatory Visit: Payer: Self-pay

## 2022-04-14 ENCOUNTER — Inpatient Hospital Stay (HOSPITAL_COMMUNITY): Payer: Medicare Other

## 2022-04-14 ENCOUNTER — Inpatient Hospital Stay: Payer: Medicare Other

## 2022-04-14 ENCOUNTER — Encounter: Payer: Self-pay | Admitting: Internal Medicine

## 2022-04-14 DIAGNOSIS — R570 Cardiogenic shock: Secondary | ICD-10-CM

## 2022-04-14 DIAGNOSIS — I214 Non-ST elevation (NSTEMI) myocardial infarction: Secondary | ICD-10-CM | POA: Diagnosis not present

## 2022-04-14 DIAGNOSIS — I340 Nonrheumatic mitral (valve) insufficiency: Secondary | ICD-10-CM | POA: Diagnosis not present

## 2022-04-14 DIAGNOSIS — E872 Acidosis, unspecified: Secondary | ICD-10-CM | POA: Diagnosis present

## 2022-04-14 DIAGNOSIS — Z66 Do not resuscitate: Secondary | ICD-10-CM | POA: Diagnosis present

## 2022-04-14 DIAGNOSIS — I213 ST elevation (STEMI) myocardial infarction of unspecified site: Secondary | ICD-10-CM | POA: Diagnosis present

## 2022-04-14 DIAGNOSIS — I82C21 Chronic embolism and thrombosis of right internal jugular vein: Secondary | ICD-10-CM | POA: Diagnosis present

## 2022-04-14 DIAGNOSIS — I69354 Hemiplegia and hemiparesis following cerebral infarction affecting left non-dominant side: Secondary | ICD-10-CM | POA: Diagnosis not present

## 2022-04-14 DIAGNOSIS — R079 Chest pain, unspecified: Secondary | ICD-10-CM | POA: Diagnosis present

## 2022-04-14 DIAGNOSIS — N170 Acute kidney failure with tubular necrosis: Secondary | ICD-10-CM | POA: Diagnosis present

## 2022-04-14 DIAGNOSIS — D6859 Other primary thrombophilia: Secondary | ICD-10-CM | POA: Diagnosis present

## 2022-04-14 DIAGNOSIS — I447 Left bundle-branch block, unspecified: Secondary | ICD-10-CM | POA: Diagnosis not present

## 2022-04-14 DIAGNOSIS — E1122 Type 2 diabetes mellitus with diabetic chronic kidney disease: Secondary | ICD-10-CM | POA: Diagnosis present

## 2022-04-14 DIAGNOSIS — D509 Iron deficiency anemia, unspecified: Secondary | ICD-10-CM | POA: Diagnosis present

## 2022-04-14 DIAGNOSIS — I5020 Unspecified systolic (congestive) heart failure: Secondary | ICD-10-CM | POA: Diagnosis not present

## 2022-04-14 DIAGNOSIS — I509 Heart failure, unspecified: Secondary | ICD-10-CM | POA: Diagnosis not present

## 2022-04-14 DIAGNOSIS — I080 Rheumatic disorders of both mitral and aortic valves: Secondary | ICD-10-CM | POA: Diagnosis present

## 2022-04-14 DIAGNOSIS — N1832 Chronic kidney disease, stage 3b: Secondary | ICD-10-CM | POA: Diagnosis present

## 2022-04-14 DIAGNOSIS — I5023 Acute on chronic systolic (congestive) heart failure: Secondary | ICD-10-CM

## 2022-04-14 DIAGNOSIS — G9340 Encephalopathy, unspecified: Secondary | ICD-10-CM | POA: Diagnosis not present

## 2022-04-14 DIAGNOSIS — D696 Thrombocytopenia, unspecified: Secondary | ICD-10-CM | POA: Diagnosis present

## 2022-04-14 DIAGNOSIS — J9601 Acute respiratory failure with hypoxia: Secondary | ICD-10-CM | POA: Diagnosis not present

## 2022-04-14 DIAGNOSIS — K55069 Acute infarction of intestine, part and extent unspecified: Secondary | ICD-10-CM | POA: Diagnosis present

## 2022-04-14 DIAGNOSIS — Z1152 Encounter for screening for COVID-19: Secondary | ICD-10-CM | POA: Diagnosis not present

## 2022-04-14 DIAGNOSIS — I428 Other cardiomyopathies: Secondary | ICD-10-CM | POA: Diagnosis present

## 2022-04-14 DIAGNOSIS — K760 Fatty (change of) liver, not elsewhere classified: Secondary | ICD-10-CM | POA: Diagnosis present

## 2022-04-14 DIAGNOSIS — K90829 Short bowel syndrome, unspecified: Secondary | ICD-10-CM | POA: Diagnosis present

## 2022-04-14 DIAGNOSIS — E871 Hypo-osmolality and hyponatremia: Secondary | ICD-10-CM | POA: Diagnosis present

## 2022-04-14 DIAGNOSIS — K2210 Ulcer of esophagus without bleeding: Secondary | ICD-10-CM | POA: Diagnosis present

## 2022-04-14 DIAGNOSIS — I472 Ventricular tachycardia, unspecified: Secondary | ICD-10-CM | POA: Diagnosis not present

## 2022-04-14 LAB — COMPREHENSIVE METABOLIC PANEL
ALT: 12 U/L (ref 0–44)
ALT: 22 U/L (ref 0–44)
AST: 23 U/L (ref 15–41)
AST: 165 U/L — ABNORMAL HIGH (ref 15–41)
Albumin: 2.7 g/dL — ABNORMAL LOW (ref 3.5–5.0)
Albumin: 2.5 g/dL — ABNORMAL LOW (ref 3.5–5.0)
Alkaline Phosphatase: 130 U/L — ABNORMAL HIGH (ref 38–126)
Alkaline Phosphatase: 130 U/L — ABNORMAL HIGH (ref 38–126)
Anion gap: 13 (ref 5–15)
Anion gap: 10 (ref 5–15)
BUN: 18 mg/dL (ref 8–23)
BUN: 20 mg/dL (ref 8–23)
CO2: 14 mmol/L — ABNORMAL LOW (ref 22–32)
CO2: 16 mmol/L — ABNORMAL LOW (ref 22–32)
Calcium: 8.2 mg/dL — ABNORMAL LOW (ref 8.9–10.3)
Calcium: 8 mg/dL — ABNORMAL LOW (ref 8.9–10.3)
Chloride: 105 mmol/L (ref 98–111)
Chloride: 103 mmol/L (ref 98–111)
Creatinine, Ser: 2.01 mg/dL — ABNORMAL HIGH (ref 0.44–1.00)
Creatinine, Ser: 2.06 mg/dL — ABNORMAL HIGH (ref 0.44–1.00)
GFR, Estimated: 26 mL/min — ABNORMAL LOW (ref >60)
GFR, Estimated: 25 mL/min — ABNORMAL LOW (ref >60)
Glucose, Bld: 189 mg/dL — ABNORMAL HIGH (ref 70–99)
Glucose, Bld: 135 mg/dL — ABNORMAL HIGH (ref 70–99)
Potassium: 4.1 mmol/L (ref 3.5–5.1)
Potassium: 4.4 mmol/L (ref 3.5–5.1)
Sodium: 132 mmol/L — ABNORMAL LOW (ref 135–145)
Sodium: 129 mmol/L — ABNORMAL LOW (ref 135–145)
Total Bilirubin: 0.8 mg/dL (ref 0.3–1.2)
Total Bilirubin: 0.5 mg/dL (ref 0.3–1.2)
Total Protein: 8.1 g/dL (ref 6.5–8.1)
Total Protein: 7.9 g/dL (ref 6.5–8.1)

## 2022-04-14 LAB — LIPID PANEL
Cholesterol: 81 mg/dL (ref 0–200)
HDL: 12 mg/dL — ABNORMAL LOW (ref >40)
LDL Cholesterol: 40 mg/dL (ref 0–99)
Total CHOL/HDL Ratio: 6.8 RATIO
Triglycerides: 145 mg/dL (ref <150)
VLDL: 29 mg/dL (ref 0–40)

## 2022-04-14 LAB — BLOOD CULTURE ID PANEL (REFLEXED) - BCID2
A.calcoaceticus-baumannii: NOT DETECTED
Bacteroides fragilis: NOT DETECTED
CTX-M ESBL: NOT DETECTED
Candida albicans: NOT DETECTED
Candida auris: NOT DETECTED
Candida glabrata: NOT DETECTED
Candida krusei: NOT DETECTED
Candida parapsilosis: NOT DETECTED
Candida tropicalis: NOT DETECTED
Carbapenem resist OXA 48 LIKE: NOT DETECTED
Carbapenem resistance IMP: NOT DETECTED
Carbapenem resistance KPC: NOT DETECTED
Carbapenem resistance NDM: NOT DETECTED
Carbapenem resistance VIM: NOT DETECTED
Cryptococcus neoformans/gattii: NOT DETECTED
Enterobacter cloacae complex: NOT DETECTED
Enterobacterales: DETECTED — AB
Enterococcus Faecium: NOT DETECTED
Enterococcus faecalis: NOT DETECTED
Escherichia coli: NOT DETECTED
Haemophilus influenzae: NOT DETECTED
Klebsiella aerogenes: DETECTED — AB
Klebsiella oxytoca: NOT DETECTED
Klebsiella pneumoniae: NOT DETECTED
Listeria monocytogenes: NOT DETECTED
Neisseria meningitidis: NOT DETECTED
Proteus species: NOT DETECTED
Pseudomonas aeruginosa: NOT DETECTED
Salmonella species: NOT DETECTED
Serratia marcescens: NOT DETECTED
Staphylococcus aureus (BCID): NOT DETECTED
Staphylococcus epidermidis: NOT DETECTED
Staphylococcus lugdunensis: NOT DETECTED
Staphylococcus species: NOT DETECTED
Stenotrophomonas maltophilia: NOT DETECTED
Streptococcus agalactiae: NOT DETECTED
Streptococcus pneumoniae: NOT DETECTED
Streptococcus pyogenes: NOT DETECTED
Streptococcus species: NOT DETECTED

## 2022-04-14 LAB — I-STAT VENOUS BLOOD GAS, ED
Acid-base deficit: 12 mmol/L — ABNORMAL HIGH (ref 0.0–2.0)
Bicarbonate: 14.5 mmol/L — ABNORMAL LOW (ref 20.0–28.0)
Calcium, Ion: 1.12 mmol/L — ABNORMAL LOW (ref 1.15–1.40)
HCT: 30 % — ABNORMAL LOW (ref 36.0–46.0)
Hemoglobin: 10.2 g/dL — ABNORMAL LOW (ref 12.0–15.0)
O2 Saturation: 94 %
Potassium: 4.3 mmol/L (ref 3.5–5.1)
Sodium: 134 mmol/L — ABNORMAL LOW (ref 135–145)
TCO2: 16 mmol/L — ABNORMAL LOW (ref 22–32)
pCO2, Ven: 32.9 mmHg — ABNORMAL LOW (ref 44–60)
pH, Ven: 7.254 (ref 7.25–7.43)
pO2, Ven: 81 mmHg — ABNORMAL HIGH (ref 32–45)

## 2022-04-14 LAB — RESPIRATORY PANEL BY PCR

## 2022-04-14 LAB — RESP PANEL BY RT-PCR (FLU A&B, COVID) ARPGX2
Influenza A by PCR: NEGATIVE
Influenza B by PCR: NEGATIVE
SARS Coronavirus 2 by RT PCR: NEGATIVE

## 2022-04-14 LAB — IRON AND TIBC
Iron: 42 ug/dL (ref 28–170)
Saturation Ratios: 12 % (ref 10.4–31.8)
TIBC: 351 ug/dL (ref 250–450)
UIBC: 309 ug/dL

## 2022-04-14 LAB — TYPE AND SCREEN
ABO/RH(D): B POS
Antibody Screen: NEGATIVE

## 2022-04-14 LAB — TRIGLYCERIDES: Triglycerides: 218 mg/dL — ABNORMAL HIGH (ref <150)

## 2022-04-14 LAB — TROPONIN I (HIGH SENSITIVITY)
Troponin I (High Sensitivity): 1113 ng/L (ref <18)
Troponin I (High Sensitivity): 241 ng/L (ref <18)
Troponin I (High Sensitivity): >24000 ng/L (ref <18)

## 2022-04-14 LAB — CBC
HCT: 29.1 % — ABNORMAL LOW (ref 36.0–46.0)
Hemoglobin: 8.8 g/dL — ABNORMAL LOW (ref 12.0–15.0)
MCH: 31.7 pg (ref 26.0–34.0)
MCHC: 30.2 g/dL (ref 30.0–36.0)
MCV: 104.7 fL — ABNORMAL HIGH (ref 80.0–100.0)
Platelets: 103 10*3/uL — ABNORMAL LOW (ref 150–400)
RBC: 2.78 MIL/uL — ABNORMAL LOW (ref 3.87–5.11)
RDW: 17.3 % — ABNORMAL HIGH (ref 11.5–15.5)
WBC: 10 10*3/uL (ref 4.0–10.5)
nRBC: 0 % (ref 0.0–0.2)

## 2022-04-14 LAB — MAGNESIUM: Magnesium: 1.9 mg/dL (ref 1.7–2.4)

## 2022-04-14 LAB — APTT: aPTT: 54 seconds — ABNORMAL HIGH (ref 24–36)

## 2022-04-14 LAB — FERRITIN: Ferritin: 145 ng/mL (ref 11–307)

## 2022-04-14 LAB — HEPARIN LEVEL (UNFRACTIONATED): Heparin Unfractionated: >1.1 IU/mL — ABNORMAL HIGH (ref 0.30–0.70)

## 2022-04-14 LAB — FOLATE: Folate: 33.8 ng/mL (ref >5.9)

## 2022-04-14 LAB — VITAMIN B12: Vitamin B-12: 1037 pg/mL — ABNORMAL HIGH (ref 180–914)

## 2022-04-14 LAB — LACTIC ACID, PLASMA: Lactic Acid, Venous: 4.1 mmol/L (ref 0.5–1.9)

## 2022-04-14 LAB — BRAIN NATRIURETIC PEPTIDE: B Natriuretic Peptide: 2920.7 pg/mL — ABNORMAL HIGH (ref 0.0–100.0)

## 2022-04-14 LAB — PHOSPHORUS: Phosphorus: 3.8 mg/dL (ref 2.5–4.6)

## 2022-04-14 SURGERY — RIGHT HEART CATH

## 2022-04-14 MED ORDER — PROCHLORPERAZINE EDISYLATE 10 MG/2ML IJ SOLN
5.0000 mg | Freq: Four times a day (QID) | INTRAMUSCULAR | Status: DC | PRN
  Administered 2022-04-14: 5 mg via INTRAVENOUS
  Filled 2022-04-14: qty 2

## 2022-04-14 MED ORDER — AMIODARONE IV BOLUS ONLY 150 MG/100ML: 150.0000 mg | Freq: Once | INTRAVENOUS | Status: DC

## 2022-04-14 MED ORDER — FUROSEMIDE 10 MG/ML IJ SOLN
20.0000 mg | Freq: Once | INTRAMUSCULAR | Status: DC
  Filled 2022-04-14: qty 2

## 2022-04-14 MED ORDER — DOBUTAMINE IN D5W 4-5 MG/ML-% IV SOLN
2.5000 ug/kg/min | INTRAVENOUS | Status: DC
  Administered 2022-04-14: 2.5 ug/kg/min via INTRAVENOUS
  Filled 2022-04-14: qty 250

## 2022-04-14 MED ORDER — SODIUM CHLORIDE 0.9 % IV SOLN
2.0000 g | INTRAVENOUS | Status: DC
  Administered 2022-04-14: 2 g via INTRAVENOUS
  Filled 2022-04-14: qty 20

## 2022-04-14 MED ORDER — SODIUM CHLORIDE 0.9% FLUSH: 10.0000 mL | INTRAVENOUS | Status: DC | PRN

## 2022-04-14 MED ORDER — OXYCODONE-ACETAMINOPHEN 5-325 MG PO TABS: 1.0000 | ORAL_TABLET | ORAL | Status: DC | PRN

## 2022-04-14 MED ORDER — HEPARIN (PORCINE) 25000 UT/250ML-% IV SOLN
800.0000 [IU]/h | INTRAVENOUS | Status: DC
  Administered 2022-04-14: 800 [IU]/h via INTRAVENOUS
  Filled 2022-04-14: qty 250

## 2022-04-14 MED ORDER — CHLORHEXIDINE GLUCONATE CLOTH 2 % EX PADS: 6.0000 | MEDICATED_PAD | Freq: Every day | CUTANEOUS | Status: DC

## 2022-04-14 MED ORDER — TRAVASOL 10 % IV SOLN
INTRAVENOUS | Status: DC
  Filled 2022-04-14: qty 801

## 2022-04-14 MED ORDER — ACETAMINOPHEN 325 MG PO TABS
650.0000 mg | ORAL_TABLET | Freq: Four times a day (QID) | ORAL | Status: DC | PRN
  Administered 2022-04-14: 650 mg via ORAL
  Filled 2022-04-14: qty 2

## 2022-04-14 MED ORDER — INSULIN ASPART 100 UNIT/ML IJ SOLN: 0.0000 [IU] | Freq: Four times a day (QID) | INTRAMUSCULAR | Status: DC

## 2022-04-14 MED ORDER — HYDROMORPHONE HCL 1 MG/ML IJ SOLN: 0.5000 mg | INTRAMUSCULAR | Status: DC | PRN

## 2022-04-14 MED ORDER — PANTOPRAZOLE SODIUM 40 MG IV SOLR
40.0000 mg | INTRAVENOUS | Status: DC
  Administered 2022-04-14: 40 mg via INTRAVENOUS
  Filled 2022-04-14: qty 10

## 2022-04-14 MED ORDER — SODIUM CHLORIDE 0.9 % IV SOLN
500.0000 mg | INTRAVENOUS | Status: DC
  Administered 2022-04-14: 500 mg via INTRAVENOUS
  Filled 2022-04-14: qty 5

## 2022-04-14 MED ORDER — NOREPINEPHRINE 4 MG/250ML-% IV SOLN
0.0000 ug/min | INTRAVENOUS | Status: DC
  Administered 2022-04-14: 3 ug/min via INTRAVENOUS
  Filled 2022-04-14: qty 250

## 2022-04-14 MED ORDER — ASPIRIN 81 MG PO TBEC
81.0000 mg | DELAYED_RELEASE_TABLET | Freq: Every day | ORAL | Status: DC
  Administered 2022-04-14: 81 mg via ORAL
  Filled 2022-04-14: qty 1

## 2022-04-14 MED ORDER — SODIUM CHLORIDE 0.9% FLUSH: 10.0000 mL | Freq: Two times a day (BID) | INTRAVENOUS | Status: DC

## 2022-04-14 MED ORDER — POLYETHYLENE GLYCOL 3350 17 G PO PACK: 17.0000 g | PACK | Freq: Every day | ORAL | Status: DC | PRN

## 2022-04-14 MED ORDER — MELATONIN 3 MG PO TABS: 3.0000 mg | ORAL_TABLET | Freq: Every evening | ORAL | Status: DC | PRN

## 2022-04-14 MED ORDER — IPRATROPIUM-ALBUTEROL 0.5-2.5 (3) MG/3ML IN SOLN: 3.0000 mL | Freq: Four times a day (QID) | RESPIRATORY_TRACT | Status: DC | PRN

## 2022-04-14 MED ORDER — MIDODRINE HCL 5 MG PO TABS
10.0000 mg | ORAL_TABLET | ORAL | Status: AC
  Administered 2022-04-14: 10 mg via ORAL
  Filled 2022-04-14: qty 2

## 2022-04-14 MED ORDER — FUROSEMIDE 10 MG/ML IJ SOLN
40.0000 mg | Freq: Once | INTRAMUSCULAR | Status: AC
  Administered 2022-04-14: 40 mg via INTRAVENOUS
  Filled 2022-04-14: qty 4

## 2022-04-14 MED ORDER — FUROSEMIDE 10 MG/ML IJ SOLN
80.0000 mg | Freq: Once | INTRAMUSCULAR | Status: AC
  Administered 2022-04-14: 80 mg via INTRAVENOUS
  Filled 2022-04-14: qty 8

## 2022-04-14 MED ORDER — AMIODARONE IV BOLUS ONLY 150 MG/100ML
INTRAVENOUS | Status: AC
  Filled 2022-04-14: qty 100

## 2022-04-14 MED ORDER — SODIUM CHLORIDE 0.9 % IV SOLN
250.0000 mL | INTRAVENOUS | Status: DC
  Administered 2022-04-14: 250 mL via INTRAVENOUS

## 2022-04-14 MED ORDER — HYDROMORPHONE HCL 1 MG/ML IJ SOLN
0.5000 mg | Freq: Once | INTRAMUSCULAR | Status: AC
  Administered 2022-04-14: 0.5 mg via INTRAVENOUS
  Filled 2022-04-14: qty 1

## 2022-04-18 LAB — CULTURE, BLOOD (ROUTINE X 2): Special Requests: ADEQUATE

## 2022-04-19 LAB — CULTURE, BLOOD (ROUTINE X 2)
Culture: NO GROWTH
Special Requests: ADEQUATE

## 2022-04-21 ENCOUNTER — Inpatient Hospital Stay: Payer: Medicare Other

## 2022-04-22 ENCOUNTER — Ambulatory Visit: Payer: Medicare Other | Admitting: Internal Medicine

## 2022-04-23 ENCOUNTER — Institutional Professional Consult (permissible substitution): Payer: Medicare Other | Admitting: Internal Medicine

## 2022-04-30 ENCOUNTER — Other Ambulatory Visit (HOSPITAL_COMMUNITY): Payer: Self-pay | Admitting: Internal Medicine

## 2022-04-30 ENCOUNTER — Inpatient Hospital Stay: Payer: Medicare Other

## 2022-05-12 NOTE — H&P (Addendum)
History and Physical  Naketa C Medinger ZOX:[id number] DOB: Jan 07, 1951 DOA: 05/11/2022  Referring physician: Scotty Court  PCP: [name], MD  Outpatient Specialists: Cardiology Patient coming from: Home  Chief Complaint: Chest pain   HPI: [name] [name] is a 72 y.o. 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  She was at rest.  She got up to start walking to see if her symptoms would improve but she became short of breath.  She presented to the urgent care for further evaluation.    At the urgent care, code STEMI was called and the patient was sent to [facility] ED.  After review of twelve-lead EKG, cardiology/STEMI team canceled the code STEMI.  Initial high-sensitivity troponin elevated 241 and uptrending, 1113.  Last dose of home Xarelto was taken around 8 PM, before the patient left her house for urgent care.  In the ED, she received IV fentanyl with improvement of her chest pain.  At the time of this visit her chest pain was 3 out of 10.  Dyspnea persists.  The patient was admitted by [facility], hospitalist service.  ED Course: Tmax 98.  BP 92/51, pulse 74, respiratory 18, saturation 99% on 2 L.  Lab studies markable for serum sodium 132, serum bicarb 14, glucose 189, creatinine 2.01.  Alkaline phosphatase 130.  BNP 2920.  WBC 11.1, hemoglobin 9.2, hematocrit 103.9.  Platelet 103.  Review of Systems: Review of systems as noted in the HPI. All other systems reviewed and are negative.   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   Carnitine deficiency ([place]) 05/25/2018   Carotid stenosis    Carotid US (9/15):  R 40-59%; L 1-39% >> FU 1 year   Cataract    Closed left subtrochanteric femur fracture ([place]) 12/31/2017   Clotting disorder ([place])    Congestive heart failure (CHF) (HCC)    EF 25-30%   COVID-19 2022   Deep venous thrombosis ([place]) left subclavian vein 07/31/2017   Fever 10/29/2020   Fracture of left clavicle    GERD (gastroesophageal reflux disease)    History of blood transfusion 2013   anemia   Hx of cardiovascular stress test    Myoview (9/15):  inf-apical scar; no ischemia; EF 47% - low risk    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 12/12/2011   Osteoporosis    Pancytopenia 10/07/2011   Pathologic fracture of neck of femur ([place])    Peripheral vascular disease ([place])    Personal history of colonic polyps    Renal insufficiency    hx of yrs ago   Serratia marcescens infection - bactermia assoc w/ PICC 01/18/2015   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[place])    Splenomegaly    By ultrasound   Stroke (cerebrum) ([place]) 07/25/2020   Stroke ([place]) 07/2020  Thrombophilia ([place])    Vitamin D deficiency    Past Surgical History:  Procedure Laterality Date   APPENDECTOMY  yrs ago   BUBBLE STUDY  01/14/2022   Procedure: BUBBLE STUDY;  Surgeon: [name], MD;  Location: [facility] ENDOSCOPY;  Service: Cardiology;;   CATARACT EXTRACTION Bilateral 2023   CHOLECYSTECTOMY  yrs ago   COLONOSCOPY  12/05/2005   internal hemorrhoids (for polyp surveillance)   COLONOSCOPY  04/26/2012   Procedure: COLONOSCOPY;  Surgeon: Gatha Mayer, MD;  Location: [facility] ENDOSCOPY;  Service: Endoscopy;  Laterality: N/A;   COLONOSCOPY WITH PROPOFOL N/A 03/18/2016   Procedure: COLONOSCOPY WITH PROPOFOL;  Surgeon:  Gatha Mayer, MD;  Location: [facility] ENDOSCOPY;  Service: Endoscopy;  Laterality: N/A;   ESOPHAGOGASTRODUODENOSCOPY  01/22/2009   erosive esophagitis   HARDWARE REMOVAL Left 12/31/2017   Procedure: HARDWARE REMOVAL;  Surgeon: Rod Can, MD;  Location: [facility] ORS;  Service: Orthopedics;  Laterality: Left;   INTRAMEDULLARY (IM) NAIL INTERTROCHANTERIC Left 12/31/2017   Procedure: INTRAMEDULLARY (IM) NAIL SUBTROCHANTRIC;  Surgeon: Rod Can, MD;  Location: [facility] ORS;  Service: Orthopedics;  Laterality: Left;   IR CV LINE INJECTION  03/23/2018   IR CV LINE INJECTION  10/02/2021   IR FLUORO GUIDE CV LINE LEFT  01/01/2018   IR FLUORO GUIDE CV LINE LEFT  03/24/2018   IR FLUORO GUIDE CV LINE LEFT  05/21/2018   IR FLUORO GUIDE CV LINE LEFT  07/09/2018   IR FLUORO GUIDE CV LINE LEFT  12/28/2020   IR FLUORO GUIDE CV LINE LEFT  07/02/2021   IR FLUORO GUIDE CV LINE LEFT  01/14/2022   IR FLUORO GUIDE CV LINE RIGHT  11/01/2020   IR GENERIC HISTORICAL  07/04/2016   IR REMOVAL TUN CV CATH W/O FL 07/04/2016 Ascencion Dike, PA-C [facility]-INTER[facility] RAD   IR GENERIC HISTORICAL  07/10/2016   IR US GUIDE VASC ACCESS LEFT 07/10/2016 Arne Cleveland, MD [facility]   IR GENERIC HISTORICAL  07/10/2016   IR FLUORO GUIDE CV LINE LEFT 07/10/2016 Arne Cleveland, MD [facility]   IR PATIENT EVAL TECH 0-60 MINS  10/07/2017   IR PATIENT EVAL TECH 0-60 MINS  03/09/2018   IR RADIOLOGIST EVAL & MGMT  07/21/2017   IR RADIOLOGIST EVAL & MGMT  11/14/2020   IR RADIOLOGIST EVAL & MGMT  11/27/2020   IR RADIOLOGIST EVAL & MGMT  01/24/2021   IR REMOVAL TUN CV CATH W/O FL  12/28/2020   IR TRANSCATH PLC STENT  INITIAL VEIN  INC ANGIOPLASTY  12/28/2020   IR US GUIDE VASC ACCESS LEFT  11/01/2020   IR US GUIDE VASC ACCESS LEFT  12/28/2020   IR US GUIDE VASC ACCESS LEFT  12/28/2020   IR US GUIDE VASC ACCESS LEFT  12/28/2020   IR US GUIDE VASC ACCESS RIGHT  11/01/2020   IR VENO/EXT/UNI LEFT  11/01/2020   IR VENOCAVAGRAM SVC  12/28/2020    LEFT HEART CATH AND CORONARY ANGIOGRAPHY N/A 06/26/2020   Procedure: LEFT HEART CATH AND CORONARY ANGIOGRAPHY;  Surgeon: Jolaine Artist, MD;  Location: [place] CV LAB;  Service: Cardiovascular;  Laterality: N/A;   MITRAL VALVE REPAIR N/A 02/13/2022   Procedure: MITRAL VALVE REPAIR;  Surgeon: [name], MD;  Location: [place] CV LAB;  Service: Cardiovascular;  Laterality: N/A;   ORIF PROXIMAL FEMORAL FRACTURE W/ ITST NAIL SYSTEM  03/2007   left, Dr. Shellia Carwin   RIGHT HEART CATH N/A 01/01/2022   Procedure: RIGHT HEART CATH;  Surgeon: Jolaine Artist,  MD;  Location: [place] CV LAB;  Service: Cardiovascular;  Laterality: N/A;   SMALL INTESTINE SURGERY  2005   multiple with right colon resection for ischemia/infarct   TEE WITHOUT CARDIOVERSION N/A 01/14/2022   Procedure: TRANSESOPHAGEAL ECHOCARDIOGRAM (TEE);  Surgeon: [name], MD;  Location: [place];  Service: Cardiology;  Laterality: N/A;   TEE WITHOUT CARDIOVERSION N/A 02/13/2022   Procedure: TRANSESOPHAGEAL ECHOCARDIOGRAM (TEE);  Surgeon: [name], MD;  Location: [place] CV LAB;  Service: Cardiovascular;  Laterality: N/A;    Social History:  reports that she quit smoking about 49 years ago. Her smoking use included cigarettes. She has never used smokeless tobacco. She reports that she does not drink alcohol and does not use drugs.   Allergies  Allergen Reactions   Cefepime Other (See Comments)    Severe encephalopathy   Ivp Dye [Iodinated Contrast Media] Itching    Can use if taking benadryl     Family History  Problem Relation Age of Onset   Diabetes Mother    Hypertension Mother    AAA (abdominal aortic aneurysm) Mother    Colon cancer Neg Hx    Stomach cancer Neg Hx       Prior to Admission medications   Medication Sig Start Date End Date Taking? Authorizing Provider  acetaminophen (TYLENOL) 500 MG tablet Take 1,000 mg by mouth every 6 (six) hours as needed for mild pain.     [provider]  ADULT TPN Inject 1,800 mLs into the vein See admin instructions. Pt receives home TPN from [facility]:  1800 mL bag, five nights weekly (Monday, Tuesday, Wednesday, Thursday and Friday for 8 hours (includes 1 hour taper up and down).    [provider]  Calcium Carb-Cholecalciferol (CALCIUM + D3 PO) Take 1 tablet by mouth in the morning and at bedtime.    [provider]  cetirizine (ZYRTEC) 10 MG tablet Take 10 mg by mouth daily as needed for allergies.    [provider]  clobetasol ointment (TEMOVATE) 0.25 % Apply 1 application  topically 2 (two) times daily as needed (rash). 06/14/21   [provider]  empagliflozin (JARDIANCE) 10 MG TABS tablet Take 1 tablet (10 mg total) by mouth daily before breakfast. 03/03/22   Bensimhon, Shaune Pascal, MD  ergocalciferol, VITAMIN D2, (DRISDOL) 200 MCG/ML drops Take 6 mLs (48,000 Units total) by mouth daily. 03/06/22   Gatha Mayer, MD  furosemide (LASIX) 40 MG tablet Take 1 tablet (40 mg total) by mouth 2 (two) times daily. Morning & early afternoon. 02/06/22   Bonnielee Haff, MD  Heparin Sodium, Porcine, (HEPARIN LOCK FLUSH IJ) Inject 5 mLs as directed See admin instructions. 5 ml with TPN infusion on Monday,Tuesday,Wednesday, Thursday, Friday.    [provider]  losartan (COZAAR) 25 MG tablet Take 0.5 tablets (12.5 mg total) by mouth at bedtime. Please resume only if your kidney function is stable. Your PCP or cardiologist should advice you. 02/12/22   Bonnielee Haff, MD  omeprazole (PRILOSEC) 40 MG capsule TAKE 1 CAPSULE BY MOUTH EVERY DAY BEFORE BREAKFAST 12/27/21   Gatha Mayer, MD  ondansetron (ZOFRAN-ODT) 8 MG disintegrating tablet TAKE 1 TABLET BY MOUTH EVERY 8 HOURS AS NEEDED FOR NAUSEA OR VOMITING. 09/09/21   Gatha Mayer, MD  Pediatric Multivit-Minerals (FLINTSTONES GUMMIES PO) Take 4 tablets by mouth in the morning.    [provider]  potassium chloride SA (KLOR-CON  M20) 20 MEQ tablet Take 1 tablet (20 mEq  total) by mouth daily. 12/16/21   Bensimhon, Shaune Pascal, MD  sodium chloride 0.9 % infusion Inject 900 mLs into the vein See admin instructions. 900 ml IV as needed for dehydration on TPN infusion days (Monday, Tuesday, Wednesday, Thursday, Friday) 10/27/17   [provider]  spironolactone (ALDACTONE) 25 MG tablet TAKE 1 TABLET (25 MG TOTAL) BY MOUTH DAILY. 08/28/21   Bensimhon, Shaune Pascal, MD  Teduglutide, rDNA, 5 MG KIT Inject 3.31 Units into the skin every morning. 10/17/21   Gatha Mayer, MD  vitamin E 1000 UNIT capsule Take 1,000 Units by mouth in the morning.    [provider]  XARELTO 20 MG TABS tablet TAKE 1 TABLET BY MOUTH DAILY WITH SUPPER. 08/30/21   [name], MD    Physical Exam: BP 92/64   Pulse 82   Temp 98 F (36.7 C)   Resp 18   Ht _0  (1.727 m)   Wt 63.5 kg   LMP 02/26/2014   SpO2 93%   BMI 21.29 kg/m   General: 72 y.o. year-old female well developed well nourished in no acute distress.  Alert and oriented x3. Cardiovascular: Regular rate and rhythm with no rubs or gallops.  No thyromegaly.  Bilateral JVD noted.  Trace lower extremity edema. 2/4 pulses in all 4 extremities. Respiratory: Mild rales at bases.  No wheezing noted.  Poor inspiratory effort. Abdomen: Soft nontender nondistended with normal bowel sounds x4 quadrants. Muskuloskeletal: No cyanosis or clubbing.  Trace bilateral lower extremity edema noted. Neuro: CN II-XII intact, strength, sensation, reflexes Skin: No ulcerative lesions noted or rashes Psychiatry: Judgement and insight appear normal. Mood is appropriate for condition and setting          Labs on Admission:  Basic Metabolic Panel: Recent Labs  Lab 04/09/22 0804 04/10/22 1035 05/07/2022 2305 2022-05-11 0104  NA 125* 133* 132* 134*  K 4.6 4.4 4.1 4.3  CL 99 103 105  --   CO2 18* 23 14*  --   GLUCOSE 105* 108* 189*  --   BUN 27* 30* 18  --   CREATININE 2.09* 1.92* 2.01*   --   CALCIUM 8.5* 8.1* 8.2*  --   MG 2.1  --   --   --   PHOS 3.9  --   --   --    Liver Function Tests: Recent Labs  Lab 04/09/22 0804 05/06/2022 2305  AST 15 23  ALT 10 12  ALKPHOS 151* 130*  BILITOT 0.6 0.8  PROT 8.0 8.1  ALBUMIN 3.2* 2.7*   No results for input(s): "LIPASE", "AMYLASE" in the last 168 hours. No results for input(s): "AMMONIA" in the last 168 hours. CBC: Recent Labs  Lab 04/09/22 0804 04/17/2022 2305 May 11, 2022 0104  WBC 6.7 11.1*  --   NEUTROABS 4.7 8.7*  --   HGB 7.7* 9.2* 10.2*  HCT 24.1* 29.5* 30.0*  MCV 99.2 103.9*  --   PLT 61* 103*  --    Cardiac Enzymes: No results for input(s): "CKTOTAL", "CKMB", "CKMBINDEX", "TROPONINI" in the last 168 hours.  BNP (last 3 results) Recent Labs    02/04/22 1955 02/11/22 1317 05/07/2022 2305  BNP 2,714.6* 2,029.1* 2,920.7*    ProBNP (last 3 results) No results for input(s): "PROBNP" in the last 8760 hours.  CBG: No results for input(s): "GLUCAP" in the last 168 hours.  Radiological Exams on Admission: DG Chest Port 1 View  Result Date: 04/19/2022 CLINICAL DATA:  Chest pain. EXAM: PORTABLE CHEST  1 VIEW COMPARISON:  Chest radiograph dated 02/11/2022. FINDINGS: Similar positioning of the left-sided catheter with tip close to the cavoatrial junction. There is diffuse interstitial coarsening and nodularity, new since the prior radiograph and concerning for edema or pneumonia. No consolidative changes. Trace pleural effusions may be present. No pneumothorax. Mild cardiomegaly. SVC stent. No acute osseous pathology. IMPRESSION: Diffuse interstitial coarsening and nodularity concerning for edema or pneumonia. Electronically Signed   By: Anner Crete M.D.   On: 05/03/2022 23:25    EKG: I independently viewed the EKG done and my findings are as followed: Sinus rhythm rate of 83.  PVCs.  Consider atypical LBBB.  Assessment/Plan Present on Admission:  Chest pain  Principal Problem:   Chest pain  Atypical chest  pain, rule out ACS Initial troponin 241, uptrending to 1113. Abnormal twelve-lead EKG, reviewed by cardiology. Code STEMI was canceled by cardiology/STEMI team. Last dose of home Xarelto was 8 PM on 04/11/2022. Heparin drip started 2022/05/01. Cardiology consulted by EDP  Elevated troponin, rule out ACS High-sensitivity troponin uptrending as stated above Trend troponin Repeat twelve-lead EKG 2D echo limited  Hypercoagulability disorder on Xarelto Chronically occluded right IJ, history of mesenteric infarct Recent thrombus on the right atrial side of the interatrial septum Start heparin drip  Acute on chronic HFrEF 35% Dyspnea persists Pulmonary edema seen on chest x-ray BNP greater than 2900 Last 2D echo done on 02/13/2022 revealed LVEF 35 to 40%, left ventricle demonstrating global hypokinesis. IV diuresing if blood pressure tolerates. Start strict I's and O's and daily weight  Non anion gap metabolic acidosis Serum bicarb 14, anion gap 12 Patient on TPN, resume; appreciate pharmacy's assistance Sodium bicarb replacement  AKI on CKD 3B Baseline creatinine appears to be 1.5 with GFR of 33 Presented with creatinine of 2.01 with GFR 26. Avoid nephrotoxic agents and hypotension. Monitor urine output with strict I's and O's and  Type 2 diabetes with hyperglycemia Presented with serum glucose of 189. Obtain hemoglobin A1c Start insulin sliding scale  Iron-deficiency anemia/anemia of chronic disease Presented with hemoglobin of 9.2, repeat 10.2. Had a recent iron infusion, hemoglobin 9.2 Next infusion of IV iron is on 2022/05/01, will be done inpatient.  Thrombocytopenia Followed by hematology     Critical care time, 65 minutes.     DVT prophylaxis: Home Xarelto, start heparin drip.  Code Status: Full code  Family Communication: Updated her husband at bedside  Disposition Plan: Admitted to progressive care unit  Consults called: Cardiology,  hematology/oncology  Admission status: Inpatient status.   Status is: Inpatient The patient requires at least 2 midnights for further evaluation and treatment of present condition.   Kayleen Memos MD [facility] Pager [phone]  If 7PM-7AM, please contact night-coverage [email or web address] Password [email or web address]  May 01, 2022, 1:28 AM

## 2022-05-12 NOTE — Progress Notes (Signed)
Echo attempted at 13:05, family is having discussion with patient concerning goals of care at this time. Will re-attempt as schedule permits.  [place]

## 2022-05-12 NOTE — ED Notes (Signed)
Provider notified of elevated troponin of 241

## 2022-05-12 NOTE — Progress Notes (Signed)
   2022/04/28 1355  Clinical Encounter Type  Visited With Family;Health care provider  Visit Type ED;Death  Referral From Nurse Clerance Lav, RN)  Consult/Referral To Chaplain Melvenia Beam)   Chaplain responded to provide emotional, spiritual and grief support to family. Chaplain met with Patient's  HUSBANDJanaa [name] [phone];    DAUGHTER: [name] [name] [phone];  and Granddaughter. Chaplain provided meaningful presence and emotional support as the family grieved the death of wife and mother, [name] [name]. Chaplain provided atmosphere that invited joyful remembrance and memorable discussion of patient's life.  Family stated that body should be released to: [facility] [phone] [place], [place] [address]  [place], M. Min., [phone]

## 2022-05-12 NOTE — Progress Notes (Addendum)
Interval Coverage Note  Subjective: Chest pain started around 830 last night, went away after given pain medication right prior to midnight.  Currently 0 out of 10 chest pain.  She continued to feel short of breath.    Objective Physical Exam: General: Alert and oriented.  Pale Cardiovascular: Heart rate regular, no significant murmur Pulmonary: Markedly diminished breath sounds in the right base of the lung, crackles and expiratory wheezing noted in the left base of the lung. Skin: No significant edema noted in the lower extremity.  Extremity skin is cool to touch   Assessment and Plan  72 year old female with extensive cardiac history, short bowel syndrome on TPN, hypercoagulable disorder on xaratlo, and severe mitral valve regurgitation presented to the ED on 03/14/22 with chest pain and shortness of breath. Patient found to have NSTEMI with troponin of greater than 24000 this morning. Creatinine is elevated at 2.06.  -Will discuss with MD, patient does appears to be slightly volume overloaded with pulmonary edema.  Being treated with both IV diuretic and antibiotic, blood culture pending.  IV diuretic held this morning due to low blood pressure.  Systolic blood pressure persistently in the 80s during examination, with cool extremity, concerning for mild cardiogenic shock.  Admit to ICU.  Will discuss with MD regarding pressures, once blood pressure improved, likely will challenge with IV diuretic 40 mg again.  Home BP medication are on hold due to low blood pressure.  Last dose of losartan and spironolactone was yesterday morning.  Last dose of Xarelto was last night.  [name] [name], Student Nurse Practitioner  Patient was seen in conjunction with [name] Lennox, NP student, I have reviewed above note which is partially written by [name] [name], it reflects my own decision making.  Patient seen, examined. Available data reviewed. Agree with findings, assessment, and plan as outlined by [name] Lennox, NP  student, and [name] Deforest, PA.  The patient is interviewed and examined.  Her husband is at the bedside.  The patient is known to me from previous evaluation of her severe mitral regurgitation and attempt at MitraClip procedure.  On my exam today, the patient is sitting upright in bed.  She is alert and oriented in no respiratory distress with normal conversation.  HEENT is normal.  Neck shows mild elevation of jugular veins.  Lungs have bibasilar crackles but otherwise good air movement at the mid and upper lung fields.  Heart is regular rate and rhythm with no appreciable murmur here in the emergency room.  Abdomen is soft and nontender.  Extremities are nonedematous.  Skin is warm and dry with no rash.  Laboratory data is reviewed.  The patient's troponin has trended to greater than 24,000.  Her creatinine is up to 2.06.  Hemoglobin is 8.8 which is relatively stable.  Platelet count is 103,000 which is increased from her most recent lab check on 1129 when it was 61,000.  EKG shows normal sinus rhythm with left bundle branch block and ST and T wave abnormality consistent with inferolateral injury, significantly changed from her baseline tracing.  The patient is now pain-free.  She appears to have sustained a significant myocardial infarction on a background of chronic systolic heart failure and moderately severe mitral regurgitation.  Her presentation is concerning for cardiogenic shock as she is hypotensive with pulmonary edema.  Her treatment is complicated by the fact that she has multiple venous occlusions related to her hypercoagulable disorder and chronic TPN.  Her right internal jugular vein is  occluded.  She has a port on the left side.  She has 2 working peripheral IVs at present.  I have discussed her case with the advanced heart failure team.  The patient has not responded to midodrine overnight and thus has not been treated with IV Lasix because of low blood pressure.  We will start low-dose  norepinephrine infusion to facilitate treating her heart failure with IV furosemide 40 mg x 1.  We will then plan to admit her to the CV-ICU and proceed with right heart catheterization likely from the femoral vein due to her venoocclusive disease in the upper extremities.  Risks, indications, and alternatives to right heart catheterization have been reviewed with the patient and her husband.  They provide full informed consent.  I would anticipate doing this later today once she is clinically improved and hopefully able to lie flatter than she currently is.  The patient is critically ill with multiple organ systems failure and requires high complexity decision making for assessment and support, frequent evaluation and titration of therapies, application of advanced monitoring technologies and extensive interpretation of multiple databases.   Critical Care Time devoted to patient care services described in this note is 45 minutes.   [name] [name], M.D. 04-30-22 10:03 AM

## 2022-05-12 NOTE — ED Notes (Signed)
Dr. Nevada Crane notifed of troponin level of 1113 from previous 241

## 2022-05-12 NOTE — Death Summary Note (Addendum)
  Advanced Heart Failure Death Summary  Death Summary   Patient ID: [name] [name] MRN: [id number], DOB/AGE: [birth year] 72 y.o. Admit date: 05/05/2022 D/C date:     2022-05-08   Primary Discharge Diagnoses:  Acute MI  Cardiogenic shock Acute on chronic systolic CHF VT  Hospital Course:   72 y.o. 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  Underwent attempted mTEER on 02/13/22 but procedure aborted due to development of thrombus on the right atrial side of the interatrial septum.    Seen in [facility] ED on evening of 04/28/2022 with sudden onset of jaw and central chest pain that began within an hr of presentation. Initially code STEMI activated d/t presence of inferolateral ST-T wave changes on ECG. After review with interventional team, code STEMI cancelled and she was transferred to [facility] for management of NSTEMI. Hs troponin peaked at > 24,000. CP resolved with pain medications. However, she subsequently developed cardiogenic shock and respiratory failure.    D/t worsening respiratory distress she was started on BiPAP and PCCM was consulted.  Throughout the morning she had increasing pressor and inotrope requirements. PCCM and HF providers discussed [place] with patient and family at bedside. She remained adamant she did not want her existing central access (for TPN) utilized. Historically patient was accepting of elective intubation for a trial to treat potentially reversible causes. It was also decided that in the event of a cardiac arrest she would not want to be resuscitated and confirmed DNR status.   While further discussing elective intubation, proceeding with RHC and possible IABP, patient went into VT with pulse. DNR status was again confirmed in the event she suffered an arrest. While antiarrhythmic drugs were being retrieved she became  pulseless and rhythm deteriorated into asystole. Patient passed with family present at the bedside. Time of death 1:27 pm.  Significant Diagnostic Studies N/A  Consultations  Cardiology Advanced Heart Failure Pulmonary Critical Care Medicine  Duration of Discharge Encounter: Greater than 35 minutes   Signed, FINCH, LINDSAY N, PA-C 05-08-22, 1:41 PM  Addendum:   Agree with above note.   Patient very chronically ill on home TPN w/ chronic systolic heart failure and hx of CVA. On my evaluation, patient in shock from late presenting STEMI. She was becoming very rapidly encephalopathic. Patient's family and husband reported that [name] wished to be DNR. She passed due to pulseless VT.   [name] 11:49 AM

## 2022-05-12 NOTE — Progress Notes (Signed)
USGPIV order placed due to vasopressor initiation. Patient has CVC in left IJ which is used for TPN. Patient refusing additional PIV access at this time. Patient has 2 patent PIV's infusing currently.   Brinnley Lacap [place] Officer, RN

## 2022-05-12 NOTE — Consult Note (Signed)
Cardiology Consultation   Patient ID: [name] [name] MRN: [id number]; DOB: 1950/11/15  Admit date: 05/05/2022 Date of Consult: April 22, 2022  PCP:  [name], MD   [place] Providers Cardiologist: Dr. Burt Knack     Patient Profile:   [name] [name] is a 72 y.o. female with a hx of  chronic systolic CHF due to suspected ischemic cardiomyopathy, non-obstructive CAD cath 2022, CVA with residual left sided weakness, hypercoagulable disorder on chronic anticoagulation with Xarelto, short gut syndrome with prior bowel infarct on chronic TPN, LBBB,  CKD stage 3, mild AS, and severe mitral regurgitation   who is being seen 04-22-2022 for the evaluation of chest pain/SOB at the request of [name].  History of Present Illness:   [name] initially presented to the [facility] ED for evaluation of jaw pain/chest pain. Patient states sudden onset bilateral jaw pain and chest tightness while making holiday cards, as well as shortness of breath. [facility] called STEMI. Dr. Claiborne Billings reviewed her ECG with ED providers at [facility] and felt less likely to be acute ST elevation MI. The STEMI called was canceled. The patient is transferred here for further evaluation.  Patient's family is at bedside. They state the patient has some sinus congestion/infection recently which might explain her jaw pain/chest pain/shortness of breath. Patient reports poor appetite, PND (one large pillow at night), weight gain (2-3 lbs in a week) and mild cough. Currently patient is chest pain free, on room air, able to speak full sentences, no acute distress. Denies fever, chills, dizziness, syncope, heart palpitations, nausea, vomiting, abdominal fullness, dysuria, diarrhea, pedal edema or any bleeding events.  Of note, patient's TEER was aborted due to development of thrombus on the right atrial side of the interatrial septum at the start of her procedure.    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/08/2012   Carnitine deficiency ([place]) 05/25/2018   Carotid stenosis    Carotid US (9/15):  R 40-59%; L 1-39% >> FU 1 year   Cataract    Closed left subtrochanteric femur fracture ([place]) 12/31/2017   Clotting disorder ([place])    Congestive heart failure (CHF) (HCC)    EF 25-30%   COVID-19 2022   Deep venous thrombosis ([place]) left subclavian vein 07/31/2017   Fever 10/29/2020   Fracture of left clavicle    GERD (gastroesophageal reflux disease)    History of blood transfusion 2013   anemia   Hx of cardiovascular stress test    Myoview (9/15):  inf-apical scar; no ischemia; EF 47% - low risk    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 12/12/2011   Osteoporosis    Pancytopenia 10/07/2011   Pathologic fracture of neck of femur ([place])    Peripheral vascular disease ([place])    Personal history of colonic polyps    Renal insufficiency    hx of yrs ago   Serratia marcescens infection - bactermia assoc w/ PICC 01/18/2015   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HCC)    Splenomegaly    By ultrasound   Stroke (cerebrum) ([place]) 07/25/2020  Stroke ([place]) 07/2020   Thrombophilia ([place])    Vitamin D deficiency     Past Surgical History:  Procedure Laterality Date   APPENDECTOMY  yrs ago   BUBBLE STUDY  01/14/2022   Procedure: BUBBLE STUDY;  Surgeon: [name], MD;  Location: [facility] ENDOSCOPY;  Service: Cardiology;;   CATARACT EXTRACTION Bilateral 2023   CHOLECYSTECTOMY  yrs ago   COLONOSCOPY  12/05/2005   internal hemorrhoids (for polyp surveillance)   COLONOSCOPY  04/26/2012   Procedure: COLONOSCOPY;  Surgeon: Gatha Mayer, MD;  Location: [facility] ENDOSCOPY;  Service: Endoscopy;   Laterality: N/A;   COLONOSCOPY WITH PROPOFOL N/A 03/18/2016   Procedure: COLONOSCOPY WITH PROPOFOL;  Surgeon: Gatha Mayer, MD;  Location: [facility] ENDOSCOPY;  Service: Endoscopy;  Laterality: N/A;   ESOPHAGOGASTRODUODENOSCOPY  01/22/2009   erosive esophagitis   HARDWARE REMOVAL Left 12/31/2017   Procedure: HARDWARE REMOVAL;  Surgeon: Rod Can, MD;  Location: [facility] ORS;  Service: Orthopedics;  Laterality: Left;   INTRAMEDULLARY (IM) NAIL INTERTROCHANTERIC Left 12/31/2017   Procedure: INTRAMEDULLARY (IM) NAIL SUBTROCHANTRIC;  Surgeon: Rod Can, MD;  Location: [facility] ORS;  Service: Orthopedics;  Laterality: Left;   IR CV LINE INJECTION  03/23/2018   IR CV LINE INJECTION  10/02/2021   IR FLUORO GUIDE CV LINE LEFT  01/01/2018   IR FLUORO GUIDE CV LINE LEFT  03/24/2018   IR FLUORO GUIDE CV LINE LEFT  05/21/2018   IR FLUORO GUIDE CV LINE LEFT  07/09/2018   IR FLUORO GUIDE CV LINE LEFT  12/28/2020   IR FLUORO GUIDE CV LINE LEFT  07/02/2021   IR FLUORO GUIDE CV LINE LEFT  01/14/2022   IR FLUORO GUIDE CV LINE RIGHT  11/01/2020   IR GENERIC HISTORICAL  07/04/2016   IR REMOVAL TUN CV CATH W/O FL 07/04/2016 Ascencion Dike, PA-C [facility]-INTER[facility] RAD   IR GENERIC HISTORICAL  07/10/2016   IR US GUIDE VASC ACCESS LEFT 07/10/2016 Arne Cleveland, MD [facility]   IR GENERIC HISTORICAL  07/10/2016   IR FLUORO GUIDE CV LINE LEFT 07/10/2016 Arne Cleveland, MD [facility]   IR PATIENT EVAL TECH 0-60 MINS  10/07/2017   IR PATIENT EVAL TECH 0-60 MINS  03/09/2018   IR RADIOLOGIST EVAL & MGMT  07/21/2017   IR RADIOLOGIST EVAL & MGMT  11/14/2020   IR RADIOLOGIST EVAL & MGMT  11/27/2020   IR RADIOLOGIST EVAL & MGMT  01/24/2021   IR REMOVAL TUN CV CATH W/O FL  12/28/2020   IR TRANSCATH PLC STENT  INITIAL VEIN  INC ANGIOPLASTY  12/28/2020   IR US GUIDE VASC ACCESS LEFT  11/01/2020   IR US GUIDE VASC ACCESS LEFT  12/28/2020   IR US GUIDE VASC ACCESS LEFT  12/28/2020   IR US GUIDE VASC ACCESS LEFT  12/28/2020   IR US  GUIDE VASC ACCESS RIGHT  11/01/2020   IR VENO/EXT/UNI LEFT  11/01/2020   IR VENOCAVAGRAM SVC  12/28/2020   LEFT HEART CATH AND CORONARY ANGIOGRAPHY N/A 06/26/2020   Procedure: LEFT HEART CATH AND CORONARY ANGIOGRAPHY;  Surgeon: Jolaine Artist, MD;  Location: [place] CV LAB;  Service: Cardiovascular;  Laterality: N/A;   MITRAL VALVE REPAIR N/A 02/13/2022   Procedure: MITRAL VALVE REPAIR;  Surgeon: [name], MD;  Location: [place] CV LAB;  Service: Cardiovascular;  Laterality: N/A;   ORIF PROXIMAL FEMORAL FRACTURE W/ ITST NAIL SYSTEM  03/2007   left, Dr. Shellia Carwin   RIGHT HEART CATH N/A 01/01/2022   Procedure: RIGHT HEART  CATH;  Surgeon: Jolaine Artist, MD;  Location: [place] CV LAB;  Service: Cardiovascular;  Laterality: N/A;   SMALL INTESTINE SURGERY  2005   multiple with right colon resection for ischemia/infarct   TEE WITHOUT CARDIOVERSION N/A 01/14/2022   Procedure: TRANSESOPHAGEAL ECHOCARDIOGRAM (TEE);  Surgeon: [name], MD;  Location: [place];  Service: Cardiology;  Laterality: N/A;   TEE WITHOUT CARDIOVERSION N/A 02/13/2022   Procedure: TRANSESOPHAGEAL ECHOCARDIOGRAM (TEE);  Surgeon: [name], MD;  Location: [place] CV LAB;  Service: Cardiovascular;  Laterality: N/A;      Inpatient Medications: Scheduled Meds:  Continuous Infusions:  PRN Meds:  Allergies:    Allergies  Allergen Reactions   Cefepime Other (See Comments)    Severe encephalopathy   Ivp Dye [Iodinated Contrast Media] Itching    Can use if taking benadryl     Social History:   Social History   Socioeconomic History   Marital status: Married    Spouse name: Not on file   Number of children: 1   Years of education: Not on file   Highest education level: Not on file  Occupational History   Not on file  Tobacco Use   Smoking status: Former    Types: Cigarettes    Quit date: 1974    Years since quitting: 49.9   Smokeless tobacco: Never  Vaping Use    Vaping Use: Never used  Substance and Sexual Activity   Alcohol use: Never   Drug use: Never   Sexual activity: Yes    Comment: 1st intercourse 54 yo-5 partners  Other Topics Concern   Not on file  Social History Narrative   ** Merged History Encounter **       Married to [place], has 1 daughter and a granddaughter the patient's son died in childhood due to a motor vehicle wreck 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 No alcohol tobacco or drug use 04/07/2017    Social Determinants of Health   Financial Resource Strain: Medium Risk (12/30/2017)   Overall Financial Resource Strain (CARDIA)    Difficulty of Paying Living Expenses: Somewhat hard  Food Insecurity: No Food Insecurity (03/04/2022)   Hunger Vital Sign    Worried About Running Out of Food in the Last Year: Never true    Ran Out of Food in the Last Year: Never true  Transportation Needs: No Transportation Needs (03/04/2022)   PRAPARE - Hydrologist (Medical): No    Lack of Transportation (Non-Medical): No  Physical Activity: Insufficiently Active (12/30/2017)   Exercise Vital Sign    Days of Exercise per Week: 7 days    Minutes of Exercise per Session: 20 min  Stress: No Stress Concern Present (12/30/2017)   [place]    Feeling of Stress : Not at all  Social Connections: Moderately Integrated (12/30/2017)   Social Connection and Isolation Panel [NHANES]    Frequency of Communication with Friends and Family: More than three times a week    Frequency of Social Gatherings with Friends and Family: Three times a week    Attends Religious Services: 1 to 4 times per year    Active Member of Clubs or Organizations: No    Attends Archivist Meetings: Never    Marital Status: Married  Human resources officer Violence: Not on file    Family History:    Family History  Problem Relation  Age of Onset   Diabetes Mother    Hypertension Mother    AAA (abdominal aortic aneurysm) Mother    Colon cancer Neg Hx    Stomach cancer Neg Hx      ROS:  Please see the history of present illness.   All other ROS reviewed and negative.     Physical Exam/Data:   Vitals:   05/10/2022 2200 04/23/2022 2247 04/21/2022 2315 April 26, 2022 0000  BP: (!) '81/63 92/66 98/61 '$ 92/64  Pulse: 83 84 80 82  Resp: (!) 22 (!) '22 20 18  '$ Temp:      SpO2: 99% 99% 100% 93%  Weight:      Height:        Intake/Output Summary (Last 24 hours) at Apr 26, 2022 0146 Last data filed at 26-Apr-2022 0035 Gross per 24 hour  Intake 102.07 ml  Output --  Net 102.07 ml      04/21/2022    9:43 PM 04/09/2022    8:29 AM 03/18/2022   10:08 AM  Last 3 Weights  Weight (lbs) 140 lb 145 lb 146 lb  Weight (kg) 63.504 kg 65.772 kg 66.225 kg     Body mass index is 21.29 kg/m.  General:  Well nourished, well developed, in no acute distress HEENT: normal Neck: no JVD Vascular: No carotid bruits; Distal pulses 2+ bilaterally Cardiac:  left chest PICC, normal S1, S2; RRR; + holosystolic murmurs  Lungs:  occasional rales posterior lung fields Abd: soft, nontender, no hepatomegaly  Ext: 1+ pitting edema Musculoskeletal:  No deformities, BUE and BLE strength normal and equal Skin: warm and dry  Neuro:  CNs 2-12 intact, moves all 4 extremities w/o difficulty Psych:  Normal affect    Relevant CV Studies:  Laboratory Data:  High Sensitivity Troponin:   Recent Labs  Lab 05/02/2022 2305  TROPONINIHS 241*     Chemistry Recent Labs  Lab 04/09/22 0804 04/10/22 1035 04/13/2022 2305 04-26-22 0104  NA 125* 133* 132* 134*  K 4.6 4.4 4.1 4.3  CL 99 103 105  --   CO2 18* 23 14*  --   GLUCOSE 105* 108* 189*  --   BUN 27* 30* 18  --   CREATININE 2.09* 1.92* 2.01*  --   CALCIUM 8.5* 8.1* 8.2*  --   MG 2.1  --   --   --   GFRNONAA 25*  --  26*  --   ANIONGAP 8  --  13  --     Recent Labs  Lab 04/09/22 0804 04/15/2022 2305   PROT 8.0 8.1  ALBUMIN 3.2* 2.7*  AST 15 23  ALT 10 12  ALKPHOS 151* 130*  BILITOT 0.6 0.8   Lipids  Recent Labs  Lab 04/09/22 0804  TRIG 184*    Hematology Recent Labs  Lab 04/09/22 0804 05/04/2022 2305 04-26-2022 0104  WBC 6.7 11.1*  --   RBC 2.43* 2.84*  2.82*  --   HGB 7.7* 9.2* 10.2*  HCT 24.1* 29.5* 30.0*  MCV 99.2 103.9*  --   MCH 31.7 32.4  --   MCHC 32.0 31.2  --   RDW 15.5 17.4*  --   PLT 61* 103*  --    Thyroid No results for input(s): "TSH", "FREET4" in the last 168 hours.  BNP Recent Labs  Lab 05/04/2022 2305  BNP 2,920.7*    DDimer No results for input(s): "DDIMER" in the last 168 hours.   Radiology/Studies:  [facility] Chest Port 1 View  Result Date: 04/15/2022  CLINICAL DATA:  Chest pain. EXAM: PORTABLE CHEST 1 VIEW COMPARISON:  Chest radiograph dated 02/11/2022. FINDINGS: Similar positioning of the left-sided catheter with tip close to the cavoatrial junction. There is diffuse interstitial coarsening and nodularity, new since the prior radiograph and concerning for edema or pneumonia. No consolidative changes. Trace pleural effusions may be present. No pneumothorax. Mild cardiomegaly. SVC stent. No acute osseous pathology. IMPRESSION: Diffuse interstitial coarsening and nodularity concerning for edema or pneumonia. Electronically Signed   By: Anner Crete M.D.   On: 05/07/2022 23:25     Assessment and Plan:   #NSTEMI # Hx of non-obstructive CAD cath 06/2020 -atypical angina, probably cardiac -troponin on admission was elevated in the setting of slightly worsening renal function and HF exacerbation. ECG demonstrated LBBB (old) -cMRI 08/2020 suggestive prior infarct with scar with no viability in the mid inferior wall and LV apex.  -acquire a limited Echo to evaluate any new WMA -continue trend troponin until peaks -continue heparin gtt for 48h if there is no intervention -Not on b-blocker due to bradycardia  -it appears her statin was held due to poor  nutrition. Recheck lipid panel -If patient remains chest pain free AND her troponin remains flat, given her comorbidities and worsening GFR, would prefer medical management (possibly add plavix if Hgb remains stable)  #HFrEF -Echo 12/16/21: EF 35-40% severe MR. Mild AS  -Mildly volume overloaded on exam, weight gain (2-3lbs in a week) -home dose lasix ('40mg'$  am and '20mg'$  pm). -would given another IV lasix '40mg'$  tonight, and continue to monitor her renal function and adjust lasix dose accordingly -continue GDMT, hx of failed entresto due to low BP per chart review -patient and her family understand she easily accumulates fluid given her failed mTEER   # severe mitral regurgitation -failed mTTR on 02/13/22 due to thrombus on the right atrial side of the interatrial septum.   -patient states she an appt with Dr. Caryl Comes on 04/23/22, may have Dr. Caryl Comes see the patient while she is in the hospital  #LBBB (old) #hx of bradycardia -recent ziopatch did not report high grade AV conduction disease. -continue Telemtry  #Hypercoaguable state -chronically occluded RIJ -h/o mesenteric infarct  -recent thrombus on the right atrial side of the interatrial septum.    - on Xarelto.    #Leukocytosis -blood Cx pending -chest CT pending  Risk Assessment/Risk Scores:     TIMI Risk Score for Unstable Angina or Non-ST Elevation MI:   The patient's TIMI risk score is 4, which indicates a 20% risk of all cause mortality, new or recurrent myocardial infarction or need for urgent revascularization in the next 14 days.  New York Heart Association (NYHA) Functional Class NYHA Class III   For questions or updates, please contact [facility] [place] Please consult [email or web address] for contact info under    Signed, Laurice Record, MD  05/12/22 1:46 AM

## 2022-05-12 NOTE — Progress Notes (Addendum)
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  Patient was found to have non-STEMI with a troponin peak to 24 K, cardiogenic shock requiring 2 IV pressors, cardiology has been consulted and has assisted in management.  Heart failure team has been also consulted.  She was a started on heparin drip.  She was placed on BiPAP due to increased work of breathing and to assist with acute systolic heart failure exacerbation.  She will require a right heart cath.  CCM has also been consulted.    Assessment & Plan:   Principal Problem:   Chest pain Active Problems:   Anemia of chronic disease   Acquired short bowel syndrome   Acute renal failure superimposed on stage 3a chronic kidney disease (HCC)   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HCC)   NAFLD (nonalcoholic fatty liver disease)   Acute respiratory failure with hypoxia (HCC)   Acute on chronic systolic CHF (congestive heart failure) (HCC)   Severe mitral regurgitation   Cardiogenic shock (HCC)   Non-ST elevation (NSTEMI) myocardial infarction ([place])   1-NSTEMI;  Patient presented with chest pain and elevation of troponin at 24 K. Cardiology consulted and following Continue with heparin drip, and aspirin.  2-Cardiogenic shock; Patient with hypotension and acute heart failure exacerbation, chest x-ray with pulmonary edema versus infiltrates. Cardiology  consulted, patient was a started on Levophed and dobutamine.  Started on IV antibiotics ceftriaxone and azithromycin To cover for pneumonia Lactic acid,  procalcitonin and blood cultures ordered  3-Acute Hypoxic Respiratory Failure, Acute Respiratory Distress; -Patient was placed on BiPAP, she appears very tachypneic and in distress.  CCM has been consulted. I came back around 12;40 pm, patient BP was low, pressors adjusted, she was on BIPAP, tachypnea , BL wheezing. CCM consulted for evaluation for intubation.  Subsequently CCM arrives and patient code status was change to DNR, family was considering elective intubation for Cath,. Per Nurse patient had removed BIPAP few minutes prior. Patient had arrhythmia and deceased.   AKI on CKD stage III B; Prior Cr range 1.5--1.6 Suspect related to cardiogenic shock Continue to monitor  Hyponatremia:  Monitor on IV Lasix  Hypercoagulable disorder: Xarelto  to transition to heparin drip  Acute on chronic Systolic heart failure reduced ejection fraction 35%: -started on IV pressors.  -Started on IV lasix.   Non-anion gap metabolic acidosis: In setting of cardiogenic shock.  Cover with IV antibiotics.   Diabetes type 2 with hyperglycemia: SSI.   Iron deficiency anemia, anemia of chronic disease: Monitor hb.    History of short-bowel syndrome on chronic TPN Continue with TPN.    Estimated body mass index is 21.29 kg/m as calculated from the following:   Height as of this encounter: '5\' 8"'$  (1.727 m).   Weight as of this encounter: 63.5 kg.   DVT prophylaxis: Heparin gtt.  Code Status: Full code---subsequently change to DNR by CCM Family Communication: Husband who was at bedside.  Disposition Plan:  Status is: Inpatient Remains inpatient appropriate because: management of resp failure, HF     Consultants:  Cardiology CCM  Procedures:    Antimicrobials:    Subjective: She was complaining  of generalized pain on my evaluation. SOB.   Objective: Vitals:   May 02, 2022 1220 May 02, 2022 1230 05/02/2022 1245 May 02, 2022 1255  BP: 105/68 112/66 96/80 (!) 100/56  Pulse: 96 (!) 51 82  (!) 109  Resp: (!) 32 (!) 25 (!) 27 (!) 26  Temp:      TempSrc:      SpO2: 95% 93% 96% 100%  Weight:      Height:        Intake/Output Summary (Last 24 hours) at 02-May-2022 1330 Last data filed at 2022-05-02 1123 Gross per 24 hour  Intake 460.47 ml  Output --  Net 460.47 ml   Filed Weights   05/07/2022 2143  Weight: 63.5 kg    Examination:  General exam: mild distress Respiratory system: BL crackles.  Cardiovascular system: S1 & S2 heard, RRR.  Gastrointestinal system: Abdomen is nondistended, soft and nontender. No organomegaly or masses felt. Normal bowel sounds heard. Central nervous system: Alert and oriented.  Extremities: Symmetric 5 x 5 power.   Data Reviewed: I have personally reviewed following labs and imaging studies  CBC: Recent Labs  Lab 04/09/22 0804 04/12/2022 2305 2022-05-02 0104 05-02-22 0340  WBC 6.7 11.1*  --  10.0  NEUTROABS 4.7 8.7*  --   --   HGB 7.7* 9.2* 10.2* 8.8*  HCT 24.1* 29.5* 30.0* 29.1*  MCV 99.2 103.9*  --  104.7*  PLT 61* 103*  --  025*   Basic Metabolic Panel: Recent Labs  Lab 04/09/22 0804 04/10/22 1035 05/01/2022 2305 05/02/2022 0104 May 02, 2022 0340  NA 125* 133* 132* 134* 129*  K 4.6 4.4 4.1 4.3 4.4  CL 99 103 105  --  103  CO2 18* 23 14*  --  16*  GLUCOSE 105* 108* 189*  --  135*  BUN 27* 30* 18  --  20  CREATININE 2.09* 1.92* 2.01*  --  2.06*  CALCIUM 8.5* 8.1* 8.2*  --  8.0*  MG 2.1  --   --   --  1.9  PHOS 3.9  --   --   --  3.8   GFR: Estimated Creatinine Clearance: 25.1 mL/min (A) (by C-G formula based on SCr of 2.06 mg/dL (H)). Liver Function Tests: Recent Labs  Lab 04/09/22 0804 04/12/2022 2305 05-02-2022 0340  AST 15 23 165*  ALT '10 12 22  '$ ALKPHOS 151* 130* 130*  BILITOT 0.6 0.8 0.5  PROT 8.0 8.1 7.9  ALBUMIN 3.2* 2.7* 2.5*   No results for input(s): "LIPASE", "AMYLASE" in the last 168 hours. No results for input(s): "AMMONIA" in the last 168 hours. Coagulation Profile: No results for input(s): "INR",  "PROTIME" in the last 168 hours. Cardiac Enzymes: No results for input(s): "CKTOTAL", "CKMB", "CKMBINDEX", "TROPONINI" in the last 168 hours. BNP (last 3 results) No results for input(s): "PROBNP" in the last 8760 hours. HbA1C: No results for input(s): "HGBA1C" in the last 72 hours. CBG: No results for input(s): "GLUCAP" in the last 168 hours. Lipid Profile: Recent Labs    05-02-2022 0340 2022/05/02 0915  CHOL  --  81  HDL  --  12*  LDLCALC  --  40  TRIG 218* 145  CHOLHDL  --  6.8   Thyroid Function Tests: No results for input(s): "TSH", "T4TOTAL", "FREET4", "T3FREE", "THYROIDAB" in the last 72 hours. Anemia Panel: Recent Labs    04/15/2022 2305  VITAMINB12 1,037*  FOLATE 33.8  FERRITIN 145  TIBC 351  IRON 42  RETICCTPCT 4.6*   Sepsis Labs: Recent Labs  Lab 04-26-2022 0930  LATICACIDVEN 4.1*    Recent Results (from the past 240 hour(s))  Resp Panel by RT-PCR (Flu A&B, Covid) Anterior Nasal Swab     Status: None   Collection Time: 05/07/2022 11:05 PM   Specimen: Anterior Nasal Swab  Result Value Ref Range Status   SARS Coronavirus 2 by RT PCR NEGATIVE NEGATIVE Final    Comment: (NOTE) SARS-CoV-2 target nucleic acids are NOT DETECTED.  The SARS-CoV-2 RNA is generally detectable in upper respiratory specimens during the acute phase of infection. The lowest concentration of SARS-CoV-2 viral copies this assay can detect is 138 copies/mL. A negative result does not preclude SARS-Cov-2 infection and should not be used as the sole basis for treatment or other patient management decisions. A negative result may occur with  improper specimen collection/handling, submission of specimen other than nasopharyngeal swab, presence of viral mutation(s) within the areas targeted by this assay, and inadequate number of viral copies(<138 copies/mL). A negative result must be combined with clinical observations, patient history, and epidemiological information. The expected result is  Negative.  Fact Sheet for Patients:  EntrepreneurPulse.com.au  Fact Sheet for Healthcare Providers:  IncredibleEmployment.be  This test is no t yet approved or cleared by the [place] FDA and  has been authorized for detection and/or diagnosis of SARS-CoV-2 by FDA under an Emergency Use Authorization (EUA). This EUA will remain  in effect (meaning this test can be used) for the duration of the COVID-19 declaration under Section 564(b)(1) of the Act, 21 U.S.C.section 360bbb-3(b)(1), unless the authorization is terminated  or revoked sooner.       Influenza A by PCR NEGATIVE NEGATIVE Final   Influenza B by PCR NEGATIVE NEGATIVE Final    Comment: (NOTE) The Xpert Xpress SARS-CoV-2/FLU/RSV plus assay is intended as an aid in the diagnosis of influenza from Nasopharyngeal swab specimens and should not be used as a sole basis for treatment. Nasal washings and aspirates are unacceptable for Xpert Xpress SARS-CoV-2/FLU/RSV testing.  Fact Sheet for Patients: EntrepreneurPulse.com.au  Fact Sheet for Healthcare Providers: IncredibleEmployment.be  This test is not yet approved or cleared by the [place] FDA and has been authorized for detection and/or diagnosis of SARS-CoV-2 by FDA under an Emergency Use Authorization (EUA). This EUA will remain in effect (meaning this test can be used) for the duration of the COVID-19 declaration under Section 564(b)(1) of the Act, 21 U.S.C. section 360bbb-3(b)(1), unless the authorization is terminated or revoked.  Performed at [facility], [place] [address], [place], [place] [address]   Respiratory (~20 pathogens) panel by PCR     Status: None   Collection Time: 04/26/2022 11:05 PM   Specimen: Anterior Nasal Swab; Respiratory  Result Value Ref Range Status   Adenovirus NOT DETECTED NOT DETECTED Final   Coronavirus 229E NOT DETECTED NOT DETECTED Final    Comment:  (NOTE) The Coronavirus on the Respiratory Panel, DOES NOT test for the novel  Coronavirus (2019 nCoV)    Coronavirus HKU1 NOT DETECTED NOT DETECTED Final   Coronavirus NL63 NOT DETECTED NOT DETECTED Final   Coronavirus OC43 NOT DETECTED NOT DETECTED Final   Metapneumovirus NOT DETECTED NOT DETECTED Final   Rhinovirus / Enterovirus NOT DETECTED NOT DETECTED Final   Influenza A NOT DETECTED NOT DETECTED Final   Influenza B NOT DETECTED NOT DETECTED Final   Parainfluenza Virus 1 NOT DETECTED NOT DETECTED Final   Parainfluenza Virus 2 NOT DETECTED NOT DETECTED Final   Parainfluenza Virus 3 NOT DETECTED  NOT DETECTED Final   Parainfluenza Virus 4 NOT DETECTED NOT DETECTED Final   Respiratory Syncytial Virus NOT DETECTED NOT DETECTED Final   Bordetella pertussis NOT DETECTED NOT DETECTED Final   Bordetella Parapertussis NOT DETECTED NOT DETECTED Final   Chlamydophila pneumoniae NOT DETECTED NOT DETECTED Final   Mycoplasma pneumoniae NOT DETECTED NOT DETECTED Final    Comment: Performed at [facility], [place] [address], [place], [place] [address]  Culture, blood (Routine X 2) w Reflex to ID Panel     Status: None (Preliminary result)   Collection Time: 05-10-2022  1:28 AM   Specimen: BLOOD LEFT ARM  Result Value Ref Range Status   Specimen Description BLOOD LEFT ARM  Final   Special Requests   Final    BOTTLES DRAWN AEROBIC AND ANAEROBIC Blood Culture adequate volume   Culture   Final    NO GROWTH < 12 HOURS Performed at [facility], [name] [address] [place] [address], [place], [place] [address]    Report Status PENDING  Incomplete  Culture, blood (Routine X 2) w Reflex to ID Panel     Status: None (Preliminary result)   Collection Time: 2022/05/10  1:33 AM   Specimen: BLOOD  Result Value Ref Range Status   Specimen Description BLOOD BOTTLES DRAWN AEROBIC ONLY  Final   Special Requests Blood Culture adequate volume  Final   Culture   Final    NO GROWTH < 12 HOURS Performed at [facility], [place] [address] [place], [place] [address]    Report Status PENDING  Incomplete         Radiology Studies: DG Chest Port 1 View  Result Date: 04/16/2022 CLINICAL DATA:  Chest pain. EXAM: PORTABLE CHEST 1 VIEW COMPARISON:  Chest radiograph dated 02/11/2022. FINDINGS: Similar positioning of the left-sided catheter with tip close to the cavoatrial junction. There is diffuse interstitial coarsening and nodularity, new since the prior radiograph and concerning for edema or pneumonia. No consolidative changes. Trace pleural effusions may be present. No pneumothorax. Mild cardiomegaly. SVC stent. No acute osseous pathology. IMPRESSION: Diffuse interstitial coarsening and nodularity concerning for edema or pneumonia. Electronically Signed   By: Anner Crete M.D.   On: 04/15/2022 23:25        Scheduled Meds:  aspirin EC  81 mg Oral Daily   Chlorhexidine Gluconate Cloth  6 each Topical Daily   insulin aspart  0-9 Units Subcutaneous Q6H   pantoprazole (PROTONIX) IV  40 mg Intravenous Q24H   sodium chloride flush  10-40 mL Intracatheter Q12H   Continuous Infusions:  sodium chloride 250 mL (05-10-2022 1013)   amiodarone     azithromycin Stopped (05-10-22 1123)   cefTRIAXone (ROCEPHIN)  IV Stopped (2022/05/10 1053)   DOBUTamine 2.5 mcg/kg/min (May 10, 2022 1153)   heparin 800 Units/hr (05/10/22 0730)   norepinephrine (LEVOPHED) Adult infusion 12 mcg/min (96/28/36 6294)   TPN CYCLIC-ADULT (ION)       LOS: 0 days    Time spent: 35 minutes    Kasie Leccese A Alta Shober, MD [facility]   If 7PM-7AM, please contact night-coverage [email or web address]  2022/05/10, 1:30 PM

## 2022-05-12 NOTE — Progress Notes (Signed)
HOME TPN FORMULA:

## 2022-05-12 NOTE — Progress Notes (Signed)
PHARMACY - TOTAL PARENTERAL NUTRITION CONSULT NOTE   Indication: Short bowel syndrome  Patient Measurements: Height: _0  (172.7 cm) Weight: 63.5 kg (140 lb) IBW/kg (Calculated) : 63.9 TPN AdjBW (KG): 63.5 Body mass index is 21.29 kg/m.  Assessment: 72 y.o. 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 Pharmacy is consulted to restart home TPN.  Home TPN Formula saved in a Progress note 12/4 9 am.  Glucose / Insulin: No history of diabetes, Electrolytes: Na 129, K 4.4, Ica 1.12, Mag 1.9 and Phos 3.8 Renal: Scr 2.06, BUN 20 Hepatic: AST 165, ALT WNL, Alk Phos 130, TG 218 Intake / Output; MIVF: Urine output not documented GI Imaging: None GI Surgeries / Procedures:  None Central access: Tunneled CVC 02/03/22 TPN start date: 12/4  Nutritional Goals: Goal TPN rate is 9 hour cyclic (provides 80 g of protein and 1755 kcals per day)  RD Assessment: Using home TPN formaula    Current Nutrition:  NPO  Plan:  Cyclic TPN at 131- 438 mL/hr starting at 1800 (cycling over 9 hours) Electrolytes in TPN: Na 139mq/L, K 33.627m/L, Ca 4.37m48mL, Mg 8.3mE77m, and Phos 7.8mmo90m. Cl:Ac 1:2 Add standard MVI and trace elements to TPN Initiate Sensitive q6h SSI and adjust as needed  Monitor TPN labs daily for first few days, then on Mon/Thurs  CathyAlanda SlimrmD, FCCM [place] Pharmacist Please see AMION for all Pharmacists' Contact Phone Numbers 04/15/11/30/235 AM

## 2022-05-12 NOTE — Consult Note (Addendum)
Advanced Heart Failure Team Consult Note   Primary Physician: [name], MD PCP-Cardiologist:  None  Reason for Consultation: Cardiogenic shock  HPI:    [name] [name] is seen today for evaluation of cardiogenic shock at the request of Dr. Burt Knack with [facility].   72 y.o. 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    Echo 12/03/16 EF 40-45% AV sclerosis mean gradient 6 mmHg   Echo 11/21 EF 20-25% mild AS mean gradient 10. Referred here   She saw Dr Donzetta Matters [facility] for RUE swelling duplex with occlusive thrombus in right IJ chronic with small right subclavian antegrade flow. Deferred any venography intervention   Cath 2/22: EF 35-40% Mild CAD  (pLAD 30%, mLCX 30%, dRCA 30%)ejection fraction. EF 35-40% with inferoapical aneurysm suggestive of prior infarct. No RHC attempted due to known lack of venous access   Admitted 3/22 with acute CVA/TIA with left-sided weakness. MRI of the brain, numerous small scattered acute cortical and subcortical infarct within the right cerebral hemisphere, right MCA territory as well as ACA and PCA territory. CTA of the head and neck, no large vessel occlusion.  70% atheromatous stenosis at the right carotid bulb.  50% stenosis at the left carotid bulb.   Echo 07/24/20 EF 35-40% with apical HK  no bubble done.   cMRI 4/22  EF 30% Akinesis of the mid-apical inferior wall, apical septum and aneurysm of the LV apex. LGE findings suggest prior infarct with scar with no viability in the mid inferior wall and LV apex. 2.  RV normal EF 49% 3.  No LV apical thrombus.  Echo 1/23 EF 35-40% Mild AS/mild to  moderate AI Echo 12/16/21: EF 35-40% RV ok. Severe MR. Mild to moderate  AI/mild AS   Admitted in 9/23 with sepsis. Seen in ED 02/04/22 after overdose attempt with [facility] following an argument with her husband.    Underwent attempted mTEER on 02/13/22 but procedure aborted due to development of thrombus on the right atrial side of the interatrial septum.   Saw Dr. Haroldine Laws for f/u last month. Volume appeared stable. She was bradycardic. Zio placed and referred to EP to discuss pacing, although concern about history of venous obstruction and ongoing TPN. Has been scheduled for appt 12/13 with Dr. Caryl Comes.  She was writing out Christmas cards yesterday evening when she developed sudden onset of jaw pain and central chest discomfort associated with dyspnea. Presented to the [facility] ED within an hour of onset. Initially code STEMI activated d/t presence of inferolateral ST changes on ECG. Code STEMI cancelled after review with interventional team. She was given 1L IVF and 324 mg aspirin prior to transfer to [facility]. Pain improved and eventually resolved after IV pain meds. Labs significant for HS troponin 241>1,113>24,000. Scr 2.01, CO2 14, Na 132, K 4.1, BNP 2,920,  Hgb 9.2, platelet 103K.  Cardiology consulted and evaluated patient this am. She is now CP free.  She is hypotensive with concern for cardiogenic shock. She was given 40 mg lasix IV X 1 and started on low-dose NE. AHF asked to evaluate patient.  She is dyspneic at rest with significant orthopnea. Sitting up at 90 degree angle in bed with mild respiratory  distress. Denies CP. NE up to 12 with SBP in 80s. Lactic acid is 4.  Review of Systems: [y] = yes, _0  = no   General: Weight gain _1 ; Weight loss _2 ; Anorexia _3 ; Fatigue [Y]; Fever _4 ; Chills _5 ; Weakness _6   Cardiac: Chest pain/pressure _7 ; Resting SOB [Y]; Exertional SOB [Y]; Orthopnea [Y]; Pedal Edema _8 ; Palpitations _9 ; Syncope _10 ; Presyncope _11 ; Paroxysmal nocturnal dyspnea_12   Pulmonary: Cough _13 ; Wheezing_14 ; Hemoptysis_15 ; Sputum _16 ; Snoring _17    GI: Vomiting_18 ; Dysphagia_19 ; Melena_20 ; Hematochezia _21 ; Heartburn_22 ; Abdominal pain _23 ; Constipation _24 ; Diarrhea _25 ; BRBPR _26   GU: Hematuria_27 ; Dysuria _28 ; Nocturia_29   Vascular: Pain in legs with walking _30 ; Pain in feet with lying flat _31 ; Non-healing sores _32 ; Stroke _33 ; TIA _34 ; Slurred speech _35 ;  Neuro: Headaches_36 ; Vertigo_37 ; Seizures_38 ; Paresthesias_39 ;Blurred vision _40 ; Diplopia _41 ; Vision changes _42   Ortho/Skin: Arthritis _43 ; Joint pain _44 ; Muscle pain _45 ; Joint swelling _46 ; Back Pain _47 ; Rash _48   Psych: Depression_49 ; Anxiety_50   Heme: Bleeding problems _51 ; Clotting disorders _52 ; Anemia _53   Endocrine: Diabetes _54 ; Thyroid dysfunction_55   Home Medications Prior to Admission medications   Medication Sig Start Date End Date Taking? Authorizing Provider  acetaminophen (TYLENOL) 500 MG tablet Take 1,000 mg by mouth every 6 (six) hours as needed for mild pain.    [provider]  ADULT TPN Inject 1,800 mLs into the vein See admin instructions. Pt receives home TPN from [facility]:  1800 mL bag, five nights weekly (Monday, Tuesday, Wednesday, Thursday and Friday for 8 hours (includes 1 hour taper up and down).    [provider]  Calcium Carb-Cholecalciferol (CALCIUM + D3 PO) Take 1 tablet by mouth in the morning and at bedtime.    [provider]  cetirizine (ZYRTEC) 10 MG tablet Take 10 mg by mouth daily as needed for allergies.    [provider]  clobetasol ointment (TEMOVATE) 8.54 % Apply 1 application  topically 2 (two) times daily as needed (rash). 06/14/21   [provider]  empagliflozin (JARDIANCE) 10 MG TABS tablet Take 1 tablet (10 mg total) by mouth daily before breakfast. 03/03/22   Bensimhon, Shaune Pascal, MD  ergocalciferol, VITAMIN D2, (DRISDOL) 200 MCG/ML drops Take 6 mLs (48,000 Units total) by mouth daily. 03/06/22   Gatha Mayer, MD  furosemide (LASIX) 40 MG tablet Take 1 tablet (40 mg total) by  mouth 2 (two) times daily. Morning & early afternoon. 02/06/22   Bonnielee Haff, MD  Heparin Sodium, Porcine, (HEPARIN LOCK FLUSH IJ) Inject 5 mLs as directed See admin instructions. 5 ml with TPN infusion on Monday,Tuesday,Wednesday, Thursday, Friday.    [provider]  losartan (COZAAR) 25 MG tablet Take 0.5 tablets (12.5 mg total) by mouth at bedtime. Please resume only if your kidney function is stable. Your PCP or cardiologist should advice you. 02/12/22   Bonnielee Haff, MD  omeprazole (PRILOSEC) 40 MG capsule TAKE 1 CAPSULE BY MOUTH EVERY DAY BEFORE BREAKFAST 12/27/21   Gatha Mayer, MD  ondansetron (ZOFRAN-ODT) 8 MG disintegrating tablet TAKE 1 TABLET BY MOUTH EVERY 8 HOURS AS NEEDED FOR NAUSEA OR VOMITING. 09/09/21   Gatha Mayer, MD  Pediatric Multivit-Minerals (FLINTSTONES GUMMIES PO)  Take 4 tablets by mouth in the morning.    [provider]  potassium chloride SA (KLOR-CON M20) 20 MEQ tablet Take 1 tablet (20 mEq total) by mouth daily. 12/16/21   Bensimhon, Shaune Pascal, MD  sodium chloride 0.9 % infusion Inject 900 mLs into the vein See admin instructions. 900 ml IV as needed for dehydration on TPN infusion days (Monday, Tuesday, Wednesday, Thursday, Friday) 10/27/17   [provider]  spironolactone (ALDACTONE) 25 MG tablet TAKE 1 TABLET (25 MG TOTAL) BY MOUTH DAILY. 08/28/21   Bensimhon, Shaune Pascal, MD  Teduglutide, rDNA, 5 MG KIT Inject 3.31 Units into the skin every morning. 10/17/21   Gatha Mayer, MD  vitamin E 1000 UNIT capsule Take 1,000 Units by mouth in the morning.    [provider]  XARELTO 20 MG TABS tablet TAKE 1 TABLET BY MOUTH DAILY WITH SUPPER. 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   Carnitine deficiency ([place]) 05/25/2018   Carotid stenosis    Carotid US (9/15):  R 40-59%; L 1-39% >> FU 1 year   Cataract    Closed left subtrochanteric femur fracture (HCC) 12/31/2017   Clotting disorder ([place])    Congestive heart failure (CHF) (HCC)    EF 25-30%   COVID-19 2022   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   Fever 10/29/2020   Fracture of left clavicle    GERD (gastroesophageal reflux disease)    History of blood transfusion 2013   anemia   Hx of cardiovascular stress test    Myoview (9/15):  inf-apical scar; no ischemia; EF 47% - low risk    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 12/12/2011   Osteoporosis    Pancytopenia 10/07/2011   Pathologic fracture of neck of femur ([place])    Peripheral vascular disease ([place])    Personal history of colonic polyps    Renal insufficiency    hx of yrs ago   Serratia marcescens infection - bactermia assoc w/ PICC 01/18/2015   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HCC)    Splenomegaly    By ultrasound   Stroke (cerebrum) ([place]) 07/25/2020   Stroke ([place]) 07/2020   Thrombophilia ([place])    Vitamin D deficiency     Past Surgical History: Past Surgical History:  Procedure Laterality Date   APPENDECTOMY  yrs ago   BUBBLE STUDY  01/14/2022   Procedure: BUBBLE STUDY;  Surgeon: [name], MD;  Location: [place];  Service: Cardiology;;   CATARACT EXTRACTION Bilateral 2023   CHOLECYSTECTOMY  yrs ago   COLONOSCOPY  12/05/2005   internal hemorrhoids (for polyp surveillance)   COLONOSCOPY  04/26/2012   Procedure: COLONOSCOPY;  Surgeon: Gatha Mayer, MD;  Location: [facility];  Service: Endoscopy;  Laterality: N/A;   COLONOSCOPY WITH PROPOFOL N/A 03/18/2016   Procedure: COLONOSCOPY WITH PROPOFOL;  Surgeon: Gatha Mayer, MD;  Location: [facility];   Service: Endoscopy;  Laterality: N/A;   ESOPHAGOGASTRODUODENOSCOPY  01/22/2009   erosive esophagitis  HARDWARE REMOVAL Left 12/31/2017   Procedure: HARDWARE REMOVAL;  Surgeon: Rod Can, MD;  Location: [facility];  Service: Orthopedics;  Laterality: Left;   INTRAMEDULLARY (IM) NAIL INTERTROCHANTERIC Left 12/31/2017   Procedure: INTRAMEDULLARY (IM) NAIL SUBTROCHANTRIC;  Surgeon: Rod Can, MD;  Location: [facility];  Service: Orthopedics;  Laterality: Left;   IR CV LINE INJECTION  03/23/2018   IR CV LINE INJECTION  10/02/2021   IR FLUORO GUIDE CV LINE LEFT  01/01/2018   IR FLUORO GUIDE CV LINE LEFT  03/24/2018   IR FLUORO GUIDE CV LINE LEFT  05/21/2018   IR FLUORO GUIDE CV LINE LEFT  07/09/2018   IR FLUORO GUIDE CV LINE LEFT  12/28/2020   IR FLUORO GUIDE CV LINE LEFT  07/02/2021   IR FLUORO GUIDE CV LINE LEFT  01/14/2022   IR FLUORO GUIDE CV LINE RIGHT  11/01/2020   IR GENERIC HISTORICAL  07/04/2016   IR REMOVAL TUN CV CATH W/O FL 07/04/2016 Ascencion Dike, PA-C [facility]   IR GENERIC HISTORICAL  07/10/2016   IR US GUIDE VASC ACCESS LEFT 07/10/2016 Arne Cleveland, MD [facility]   IR GENERIC HISTORICAL  07/10/2016   IR FLUORO GUIDE CV LINE LEFT 07/10/2016 Arne Cleveland, MD [facility]   IR PATIENT EVAL TECH 0-60 MINS  10/07/2017   IR PATIENT EVAL TECH 0-60 MINS  03/09/2018   IR RADIOLOGIST EVAL & MGMT  07/21/2017   IR RADIOLOGIST EVAL & MGMT  11/14/2020   IR RADIOLOGIST EVAL & MGMT  11/27/2020   IR RADIOLOGIST EVAL & MGMT  01/24/2021   IR REMOVAL TUN CV CATH W/O FL  12/28/2020   IR TRANSCATH PLC STENT  INITIAL VEIN  INC ANGIOPLASTY  12/28/2020   IR US GUIDE VASC ACCESS LEFT  11/01/2020   IR US GUIDE VASC ACCESS LEFT  12/28/2020   IR US GUIDE VASC ACCESS LEFT  12/28/2020   IR US GUIDE VASC ACCESS LEFT  12/28/2020   IR US GUIDE VASC ACCESS RIGHT  11/01/2020   IR VENO/EXT/UNI LEFT  11/01/2020   IR VENOCAVAGRAM SVC  12/28/2020   LEFT HEART CATH AND CORONARY ANGIOGRAPHY N/A  06/26/2020   Procedure: LEFT HEART CATH AND CORONARY ANGIOGRAPHY;  Surgeon: Jolaine Artist, MD;  Location: [place] [facility];  Service: Cardiovascular;  Laterality: N/A;   MITRAL VALVE REPAIR N/A 02/13/2022   Procedure: MITRAL VALVE REPAIR;  Surgeon: [name], MD;  Location: [place] [facility];  Service: Cardiovascular;  Laterality: N/A;   ORIF PROXIMAL FEMORAL FRACTURE W/ ITST NAIL SYSTEM  03/2007   left, Dr. Shellia Carwin   RIGHT HEART CATH N/A 01/01/2022   Procedure: RIGHT HEART CATH;  Surgeon: Jolaine Artist, MD;  Location: [place] [facility];  Service: Cardiovascular;  Laterality: N/A;   SMALL INTESTINE SURGERY  2005   multiple with right colon resection for ischemia/infarct   TEE WITHOUT CARDIOVERSION N/A 01/14/2022   Procedure: TRANSESOPHAGEAL ECHOCARDIOGRAM (TEE);  Surgeon: [name], MD;  Location: [facility];  Service: Cardiology;  Laterality: N/A;   TEE WITHOUT CARDIOVERSION N/A 02/13/2022   Procedure: TRANSESOPHAGEAL ECHOCARDIOGRAM (TEE);  Surgeon: [name], MD;  Location: [place] [facility];  Service: Cardiovascular;  Laterality: N/A;    Family History: Family History  Problem Relation Age of Onset   Diabetes Mother    Hypertension Mother    AAA (abdominal aortic aneurysm) Mother    Colon cancer Neg Hx    Stomach cancer Neg Hx     Social History:  Social History   Socioeconomic History   Marital status: Married    Spouse name: Not on file   Number of children: 1   Years of education: Not on file   Highest education level: Not on file  Occupational History   Not on file  Tobacco Use   Smoking status: Former    Types: Cigarettes    Quit date: 75    Years since quitting: 49.9   Smokeless tobacco: Never  Vaping Use   Vaping Use: Never used  Substance and Sexual Activity   Alcohol use: Never   Drug use: Never   Sexual activity: Yes    Comment: 1st intercourse 79 yo-5 partners  Other Topics Concern   Not on file  Social History  Narrative   ** Merged History Encounter **       Married to [place], has 1 daughter and a granddaughter the patient's son died in childhood due to a motor vehicle wreck 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 No alcohol tobacco or drug use 04/07/2017    Social Determinants of Health   Financial Resource Strain: Medium Risk (12/30/2017)   Overall Financial Resource Strain (CARDIA)    Difficulty of Paying Living Expenses: Somewhat hard  Food Insecurity: No Food Insecurity (03/04/2022)   Hunger Vital Sign    Worried About Running Out of Food in the Last Year: Never true    [place] in the Last Year: Never true  Transportation Needs: No Transportation Needs (03/04/2022)   PRAPARE - Hydrologist (Medical): No    Lack of Transportation (Non-Medical): No  Physical Activity: Insufficiently Active (12/30/2017)   Exercise Vital Sign    Days of Exercise per Week: 7 days    Minutes of Exercise per Session: 20 min  Stress: No Stress Concern Present (12/30/2017)   [place]    Feeling of Stress : Not at all  Social Connections: Moderately Integrated (12/30/2017)   Social Connection and Isolation Panel [NHANES]    Frequency of Communication with Friends and Family: More than three times a week    Frequency of Social Gatherings with Friends and Family: Three times a week    Attends Religious Services: 1 to 4 times per year    Active Member of Clubs or Organizations: No    Attends Archivist Meetings: Never    Marital Status: Married    Allergies:  Allergies  Allergen Reactions   Cefepime Other (See Comments)    Severe encephalopathy   Ivp Dye [Iodinated Contrast Media] Itching    Can use if taking benadryl     Objective:    Vital Signs:   Temp:  [97.4 F (36.3 C)-98 F (36.7 C)] 97.4 F (36.3 C) (12/04 0855) Pulse Rate:  [69-96] 84  (12/04 1100) Resp:  [13-28] 27 (12/04 1100) BP: (81-101)/(51-68) 87/67 (12/04 1100) SpO2:  [92 %-100 %] 100 % (12/04 1100) Weight:  [63.5 kg] 63.5 kg (12/03 2143)    Weight change: Filed Weights   05/09/2022 2143  Weight: 63.5 kg    Intake/Output:   Intake/Output Summary (Last 24 hours) at 04/24/2022 1205 Last data filed at 04-24-22 1123 Gross per 24 hour  Intake 460.47 ml  Output --  Net 460.47 ml      Physical Exam    General:  Thin, elderly female. Appears critically ill. HEENT: normal Neck: supple. JVP to jaw.  Carotids 2+ bilat; no bruits. + L IJ port. Cor: PMI nondisplaced. Regular rate & rhythm. No rubs, gallops or murmurs. Lungs: bibasilar crackles Abdomen: soft, nontender, nondistended.  Extremities: no cyanosis, clubbing, rash, trace edema, b/l LE are cool Neuro: alert & orientedx3, cranial nerves grossly intact. moves all 4 extremities w/o difficulty. Affect pleasant   Telemetry   SR 80s  EKG 12/03 11:34 pm    SR 84 bpm, 1st degree AVB, LBBB, ST and T wave changes inferolateral leads  Labs   Basic Metabolic Panel: Recent Labs  Lab 04/09/22 0804 04/10/22 1035 04/23/2022 2305 04/28/22 0104 04-28-2022 0340  NA 125* 133* 132* 134* 129*  K 4.6 4.4 4.1 4.3 4.4  CL 99 103 105  --  103  CO2 18* 23 14*  --  16*  GLUCOSE 105* 108* 189*  --  135*  BUN 27* 30* 18  --  20  CREATININE 2.09* 1.92* 2.01*  --  2.06*  CALCIUM 8.5* 8.1* 8.2*  --  8.0*  MG 2.1  --   --   --  1.9  PHOS 3.9  --   --   --  3.8    Liver Function Tests: Recent Labs  Lab 04/09/22 0804 04/28/2022 2305 2022-04-28 0340  AST 15 23 165*  ALT _0 ALKPHOS 151* 130* 130*  BILITOT 0.6 0.8 0.5  PROT 8.0 8.1 7.9  ALBUMIN 3.2* 2.7* 2.5*   No results for input(s): "LIPASE", "AMYLASE" in the last 168 hours. No results for input(s): "AMMONIA" in the last 168 hours.  CBC: Recent Labs  Lab 04/09/22 0804 05/05/2022 2305 2022/04/28 0104 28-Apr-2022 0340  WBC 6.7 11.1*  --  10.0  NEUTROABS  4.7 8.7*  --   --   HGB 7.7* 9.2* 10.2* 8.8*  HCT 24.1* 29.5* 30.0* 29.1*  MCV 99.2 103.9*  --  104.7*  PLT 61* 103*  --  103*    Cardiac Enzymes: No results for input(s): "CKTOTAL", "CKMB", "CKMBINDEX", "TROPONINI" in the last 168 hours.  BNP: BNP (last 3 results) Recent Labs    02/04/22 1955 02/11/22 1317 05/02/2022 2305  BNP 2,714.6* 2,029.1* 2,920.7*    ProBNP (last 3 results) No results for input(s): "PROBNP" in the last 8760 hours.   CBG: No results for input(s): "GLUCAP" in the last 168 hours.  Coagulation Studies: No results for input(s): "LABPROT", "INR" in the last 72 hours.   Imaging   DG Chest Port 1 View  Result Date: 04/15/2022 CLINICAL DATA:  Chest pain. EXAM: PORTABLE CHEST 1 VIEW COMPARISON:  Chest radiograph dated 02/11/2022. FINDINGS: Similar positioning of the left-sided catheter with tip close to the cavoatrial junction. There is diffuse interstitial coarsening and nodularity, new since the prior radiograph and concerning for edema or pneumonia. No consolidative changes. Trace pleural effusions may be present. No pneumothorax. Mild cardiomegaly. SVC stent. No acute osseous pathology. IMPRESSION: Diffuse interstitial coarsening and nodularity concerning for edema or pneumonia. Electronically Signed   By: Anner Crete M.D.   On: 04/23/2022 23:25     Medications:     Current Medications:  aspirin EC  81 mg Oral Daily   Chlorhexidine Gluconate Cloth  6 each Topical Daily   furosemide  80 mg Intravenous Once   insulin aspart  0-9 Units Subcutaneous Q6H   pantoprazole (PROTONIX) IV  40 mg Intravenous Q24H   sodium chloride flush  10-40 mL Intracatheter Q12H    Infusions:  sodium chloride 250 mL (Apr 28, 2022 1013)   azithromycin  Stopped (04-26-2022 1123)   cefTRIAXone (ROCEPHIN)  IV Stopped (26-Apr-2022 1053)   DOBUTamine 2.5 mcg/kg/min (04-26-22 1153)   heparin 800 Units/hr (2022/04/26 0730)   norepinephrine (LEVOPHED) Adult infusion 12 mcg/min (2022/04/26  9935)   TPN CYCLIC-ADULT (ION)        Patient Profile   72 y.o. female with history of chronic systolic CHF, severe MR, LBBB, hx CVA, hypercoagulable state, short bowel syndrome on chronic TPN, ITP/iron deficiency anemia. Admitted with acute MI c/b cardiogenic shock.  Assessment/Plan   Acute MI:  -Presented 12/03 with chest pain and jaw pain. HS troponin > 24K. ECG with inferolateral ST and T wave abnormality -No significant CAD on prior cath in 2022 but did have evidence of prior infarct with scarring and no viability in mid inferior wall and LV apex on cMRI in 04/22. ? If she's been having recurrent thrombotic events. -Now pain free -Continue aspirin. Heparin gtt. Add statin. -RHC today, defer LHC for now with AKI (Scr 2.1>baseline 1.5)  2. Cardiogenic shock: -In setting of acute MI with baseline chronic systolic CHF (EF 70-17% on TEE 10/23) -Lactic acid 4.1>>trend to clearance -Has been started on NE, currently up to 12. Add DBA 2.5 mcg/kg/min for additional support. RHC today. May need mechanical support -Had been started on abx with ceftriaxone and azithromycin for possible CAP. Suspect findings on Xray more likely d/t pulmonary edema. Resp panel negative. BC X 2 pending.  3. Acute on chronic systolic CHF: -Cath 7/93/90: Mild CAD.  EF 35-40% with inferoapical aneurysm suggestive of prior infarct (depsite lack of significant CAD). No RHC attempted due to known lack of venous access -Echo 07/24/20 EF 35-40% with apical HK  no bubble done.  -cMRI 4/22 EF 30% Akinesis of the mid-apical inferior wall, apical septum and aneurysm of the LV apex. LGE findings suggest prior infarct with scar with no viability in the mid inferior wall and LV apex. RV normal  -Echo 12/16/21: EF 35-40% severe MR. Mild AS -TEE 10/23: EF 35-40%, moderate to severe MR -Currently NYHA IV. Received 40 mg lasix IV this am. Give another 80 mg lasix IV now.  -Check limited echo -Inotrope support as above -GDMT once  off pressor support  4. Severe MR: -Attempted mTEER 02/13/22  but procedure aborted due to development of thrombus on the right atrial side of the interatrial septum.    5. LBBB -Doubt would qualify for CRT with access issues and risk for infection with TPN  6. Hypercoagulable state -Chronically occluded R IJ -Hx mesenteric infarct -Heparin gtt. On Xarelto at home  7. 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 Has port left IJ. -Limited venous access.  8. Hx CVA  9. Aortic valve disease -Mild AS and moderate AI on TEE 10/23  10. AKI on CKD: -Baseline Scr 1.5, 2.1 today -Likely d/t ATN in setting of shock -Watch BMET -Support BP  11. Iron deficiency anemia/thrombocytopenia -Sees hematology -Recently started Aranesp -Had planned outpatient infusions for IV iron -Hgb stable 8.8   GOC: -Currently full code -Multiple chronic medical issues. Complicated history. Depending on progress may need palliative consult for [place] discussions.  Patient will be admitted to [facility]. RHC later today. CCM consulted to assist with management of other medical issues. Very difficult venous access. Hospitalist team updated.  Length of Stay: 0  Tenika Keeran N, PA-C  2022-04-26, 12:05 PM  Advanced Heart Failure Team Pager [phone] (M-F; 7a - 5p)  Please contact [facility] for night-coverage after hours (4p -  7a ) and weekends on [email or web address]

## 2022-05-12 NOTE — Significant Event (Addendum)
PCCM was consulted regarding worsening respiratory failure in this 72 yo F with complex cardiovascular medical history (as well as extensive medical history otherwise).   On my arrival RN is at the bedside managing escalating pressors and assisting pt. Pt is in moderate distress on BiPAP, using accessory muscles. She feels her breathing and body are getting more tired, and it is hard to breath.   Clinically, intubation would be indicated. I discussed this with the patient, her husband and daughter at the bedside. Husband begins by stating "well she is a DNR," and daughter follows with "but if it were something short term that was fixable she would go on life support for that."  I confirmed this as the following -- the patient historically would accept elective intubation for time trial to reverse reversible causes. However, in event of arrest, she would not be resuscitated and is DNR.   We talked also about her pressor requirement increasing. 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 Remains steadfast in NOT allowing access of chronic central access.   Family has asked for a few minutes to talk about elective intubation for current presentation. I encouraged reconsideration of central line access limitations.  About 5 minutes later husband welcomed [place] into room -- joined by [facility]  Dr. Loanne Drilling and advanced heart failure Dr. Aundria Mems as well.    While beginning to discuss intubation, RHC and IABP be pursued, the patient developed an arrhythmia, with a pulse.   I confirmed again with family pt DNR status in event of arrest.   While amio was being retrieved, the patient became pulseless and quickly went into asystole, with family at the bedside.    She died with family at the bedside    No charge  Eliseo Gum MSN, AGACNP-BC [place] for pager 04/21/22,  1:34 PM

## 2022-05-12 DEATH — deceased
